# Patient Record
Sex: Female | Born: 1945
Health system: Southern US, Community
[De-identification: ages and names within clinical notes are randomized; demographics above are authoritative.]

## PROBLEM LIST (undated history)

## (undated) DIAGNOSIS — H269 Unspecified cataract: Secondary | ICD-10-CM

## (undated) DIAGNOSIS — F329 Major depressive disorder, single episode, unspecified: Secondary | ICD-10-CM

## (undated) DIAGNOSIS — F419 Anxiety disorder, unspecified: Secondary | ICD-10-CM

## (undated) DIAGNOSIS — I4891 Unspecified atrial fibrillation: Secondary | ICD-10-CM

## (undated) DIAGNOSIS — N186 End stage renal disease: Secondary | ICD-10-CM

## (undated) DIAGNOSIS — R42 Dizziness and giddiness: Secondary | ICD-10-CM

## (undated) DIAGNOSIS — N189 Chronic kidney disease, unspecified: Secondary | ICD-10-CM

## (undated) DIAGNOSIS — E1122 Type 2 diabetes mellitus with diabetic chronic kidney disease: Secondary | ICD-10-CM

## (undated) DIAGNOSIS — N1330 Unspecified hydronephrosis: Secondary | ICD-10-CM

## (undated) DIAGNOSIS — E785 Hyperlipidemia, unspecified: Secondary | ICD-10-CM

## (undated) DIAGNOSIS — Z87442 Personal history of urinary calculi: Secondary | ICD-10-CM

## (undated) DIAGNOSIS — I700 Atherosclerosis of aorta: Secondary | ICD-10-CM

## (undated) DIAGNOSIS — K219 Gastro-esophageal reflux disease without esophagitis: Secondary | ICD-10-CM

## (undated) DIAGNOSIS — M109 Gout, unspecified: Secondary | ICD-10-CM

## (undated) DIAGNOSIS — E669 Obesity, unspecified: Secondary | ICD-10-CM

## (undated) DIAGNOSIS — E119 Type 2 diabetes mellitus without complications: Secondary | ICD-10-CM

## (undated) DIAGNOSIS — I10 Essential (primary) hypertension: Secondary | ICD-10-CM

## (undated) DIAGNOSIS — D631 Anemia in chronic kidney disease: Secondary | ICD-10-CM

## (undated) DIAGNOSIS — K652 Spontaneous bacterial peritonitis: Secondary | ICD-10-CM

## (undated) DIAGNOSIS — N185 Chronic kidney disease, stage 5: Secondary | ICD-10-CM

## (undated) DIAGNOSIS — F32A Depression, unspecified: Secondary | ICD-10-CM

## (undated) DIAGNOSIS — I499 Cardiac arrhythmia, unspecified: Secondary | ICD-10-CM

## (undated) DIAGNOSIS — M1990 Unspecified osteoarthritis, unspecified site: Secondary | ICD-10-CM

## (undated) HISTORY — PX: EYE SURGERY: SHX253

## (undated) HISTORY — DX: Hyperlipidemia, unspecified: E78.5

## (undated) HISTORY — DX: Type 2 diabetes mellitus without complications: E11.9

## (undated) HISTORY — DX: Dizziness and giddiness: R42

## (undated) HISTORY — DX: Essential (primary) hypertension: I10

---
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
## 1990-11-26 HISTORY — PX: TUBAL LIGATION: SHX77

## 2004-07-19 ENCOUNTER — Other Ambulatory Visit: Payer: Self-pay

## 2004-08-29 ENCOUNTER — Inpatient Hospital Stay: Payer: Self-pay | Admitting: Cardiovascular Disease

## 2004-08-29 ENCOUNTER — Other Ambulatory Visit: Payer: Self-pay

## 2004-08-30 ENCOUNTER — Other Ambulatory Visit: Payer: Self-pay

## 2005-01-15 ENCOUNTER — Ambulatory Visit: Payer: Self-pay | Admitting: Specialist

## 2005-02-08 ENCOUNTER — Ambulatory Visit: Payer: Self-pay | Admitting: Specialist

## 2005-09-11 ENCOUNTER — Other Ambulatory Visit: Payer: Self-pay

## 2005-09-20 ENCOUNTER — Ambulatory Visit: Payer: Self-pay | Admitting: Specialist

## 2005-10-07 ENCOUNTER — Emergency Department: Payer: Self-pay | Admitting: Internal Medicine

## 2014-01-27 ENCOUNTER — Inpatient Hospital Stay: Payer: Self-pay | Admitting: Family Medicine

## 2014-01-27 LAB — COMPREHENSIVE METABOLIC PANEL
ALK PHOS: 110 U/L
AST: 36 U/L (ref 15–37)
Albumin: 4.1 g/dL (ref 3.4–5.0)
Anion Gap: 8 (ref 7–16)
BILIRUBIN TOTAL: 0.5 mg/dL (ref 0.2–1.0)
BUN: 21 mg/dL — ABNORMAL HIGH (ref 7–18)
CALCIUM: 9.5 mg/dL (ref 8.5–10.1)
CREATININE: 1.27 mg/dL (ref 0.60–1.30)
Chloride: 99 mmol/L (ref 98–107)
Co2: 24 mmol/L (ref 21–32)
GFR CALC AF AMER: 51 — AB
GFR CALC NON AF AMER: 44 — AB
GLUCOSE: 328 mg/dL — AB (ref 65–99)
OSMOLALITY: 278 (ref 275–301)
Potassium: 4.6 mmol/L (ref 3.5–5.1)
SGPT (ALT): 30 U/L (ref 12–78)
SODIUM: 131 mmol/L — AB (ref 136–145)
TOTAL PROTEIN: 8.1 g/dL (ref 6.4–8.2)

## 2014-01-27 LAB — CK TOTAL AND CKMB (NOT AT ARMC)
CK, TOTAL: 113 U/L
CK, TOTAL: 123 U/L
CK-MB: 2.7 ng/mL (ref 0.5–3.6)
CK-MB: 2.7 ng/mL (ref 0.5–3.6)

## 2014-01-27 LAB — TROPONIN I
TROPONIN-I: 0.13 ng/mL — AB
TROPONIN-I: 0.14 ng/mL — AB
Troponin-I: 0.14 ng/mL — ABNORMAL HIGH

## 2014-01-27 LAB — URINALYSIS, COMPLETE
BILIRUBIN, UR: NEGATIVE
Bacteria: NONE SEEN
Ketone: NEGATIVE
Nitrite: NEGATIVE
Ph: 5 (ref 4.5–8.0)
Protein: >=500
RBC,UR: 3 /HPF (ref 0–5)
SPECIFIC GRAVITY: 1.024 (ref 1.003–1.030)
WBC UR: 30 /HPF (ref 0–5)

## 2014-01-27 LAB — APTT
Activated PTT: 26.8 secs (ref 23.6–35.9)
Activated PTT: 70.6 secs — ABNORMAL HIGH (ref 23.6–35.9)

## 2014-01-27 LAB — CBC
HCT: 41.6 % (ref 35.0–47.0)
HGB: 14.1 g/dL (ref 12.0–16.0)
MCH: 30 pg (ref 26.0–34.0)
MCHC: 33.9 g/dL (ref 32.0–36.0)
MCV: 88 fL (ref 80–100)
Platelet: 183 10*3/uL (ref 150–440)
RBC: 4.7 10*6/uL (ref 3.80–5.20)
RDW: 13.8 % (ref 11.5–14.5)
WBC: 7.7 10*3/uL (ref 3.6–11.0)

## 2014-01-28 LAB — CBC WITH DIFFERENTIAL/PLATELET
BASOS ABS: 0.1 10*3/uL (ref 0.0–0.1)
Basophil %: 0.5 %
EOS PCT: 0.1 %
Eosinophil #: 0 10*3/uL (ref 0.0–0.7)
HCT: 42.4 % (ref 35.0–47.0)
HGB: 13.9 g/dL (ref 12.0–16.0)
LYMPHS ABS: 1.3 10*3/uL (ref 1.0–3.6)
LYMPHS PCT: 10.1 %
MCH: 29.1 pg (ref 26.0–34.0)
MCHC: 32.9 g/dL (ref 32.0–36.0)
MCV: 89 fL (ref 80–100)
MONO ABS: 0.4 x10 3/mm (ref 0.2–0.9)
Monocyte %: 2.7 %
Neutrophil #: 11.5 10*3/uL — ABNORMAL HIGH (ref 1.4–6.5)
Neutrophil %: 86.6 %
Platelet: 185 10*3/uL (ref 150–440)
RBC: 4.79 10*6/uL (ref 3.80–5.20)
RDW: 14.2 % (ref 11.5–14.5)
WBC: 13.3 10*3/uL — AB (ref 3.6–11.0)

## 2014-01-28 LAB — BASIC METABOLIC PANEL
Anion Gap: 8 (ref 7–16)
BUN: 24 mg/dL — AB (ref 7–18)
CALCIUM: 9.8 mg/dL (ref 8.5–10.1)
Chloride: 100 mmol/L (ref 98–107)
Co2: 26 mmol/L (ref 21–32)
Creatinine: 1.24 mg/dL (ref 0.60–1.30)
GFR CALC AF AMER: 52 — AB
GFR CALC NON AF AMER: 45 — AB
GLUCOSE: 356 mg/dL — AB (ref 65–99)
OSMOLALITY: 287 (ref 275–301)
Potassium: 4.7 mmol/L (ref 3.5–5.1)
Sodium: 134 mmol/L — ABNORMAL LOW (ref 136–145)

## 2014-01-28 LAB — APTT
Activated PTT: 63.5 secs — ABNORMAL HIGH (ref 23.6–35.9)
Activated PTT: 25.2 secs (ref 23.6–35.9)

## 2014-01-28 LAB — LIPID PANEL
Cholesterol: 154 mg/dL (ref 0–200)
HDL Cholesterol: 53 mg/dL (ref 40–60)
LDL CHOLESTEROL, CALC: 47 mg/dL (ref 0–100)
Triglycerides: 269 mg/dL — ABNORMAL HIGH (ref 0–200)
VLDL Cholesterol, Calc: 54 mg/dL — ABNORMAL HIGH (ref 5–40)

## 2014-01-28 LAB — HEMOGLOBIN A1C: Hemoglobin A1C: 12.3 % — ABNORMAL HIGH (ref 4.2–6.3)

## 2014-01-28 LAB — MAGNESIUM: Magnesium: 1.1 mg/dL — ABNORMAL LOW

## 2014-01-28 LAB — TSH: Thyroid Stimulating Horm: 1.97 u[IU]/mL

## 2014-01-29 LAB — BASIC METABOLIC PANEL
Anion Gap: 8 (ref 7–16)
BUN: 31 mg/dL — ABNORMAL HIGH (ref 7–18)
CHLORIDE: 96 mmol/L — AB (ref 98–107)
Calcium, Total: 9.7 mg/dL (ref 8.5–10.1)
Co2: 29 mmol/L (ref 21–32)
Creatinine: 1.47 mg/dL — ABNORMAL HIGH (ref 0.60–1.30)
EGFR (Non-African Amer.): 37 — ABNORMAL LOW
GFR CALC AF AMER: 43 — AB
GLUCOSE: 377 mg/dL — AB (ref 65–99)
Osmolality: 288 (ref 275–301)
POTASSIUM: 4 mmol/L (ref 3.5–5.1)
Sodium: 133 mmol/L — ABNORMAL LOW (ref 136–145)

## 2014-01-29 LAB — HEMOGLOBIN: HGB: 13.9 g/dL (ref 12.0–16.0)

## 2014-01-29 LAB — PLATELET COUNT: PLATELETS: 217 10*3/uL (ref 150–440)

## 2014-01-29 LAB — MAGNESIUM: MAGNESIUM: 1.8 mg/dL

## 2014-02-16 ENCOUNTER — Ambulatory Visit: Payer: Self-pay | Admitting: Family Medicine

## 2014-02-24 ENCOUNTER — Ambulatory Visit: Payer: Self-pay | Admitting: Family Medicine

## 2014-03-26 ENCOUNTER — Ambulatory Visit: Payer: Self-pay | Admitting: Family Medicine

## 2014-04-26 ENCOUNTER — Ambulatory Visit: Payer: Self-pay | Admitting: Family Medicine

## 2015-03-19 NOTE — Consult Note (Signed)
PATIENT NAME:  Heather Lopez, Heather Lopez MR#:  [id number] DATE OF BIRTH:  05-03-1946  DATE OF CONSULTATION:  01/28/2014  REFERRING PHYSICIAN:   CONSULTING PHYSICIAN:  Dionisio David, MD  INDICATION FOR CONSULTATION: Elevated troponin, non- STEMI, presyncope.   HISTORY OF PRESENT ILLNESS: This is a 69 year old white female with a past medical history of hypertension, hyperlipidemia, and diabetes, usually uncontrolled, who came into the hospital because all of a sudden felt like she is going to pass out. She felt dizzy with her head spinning. Her troponin was 0.14. I was asked to evaluate the patient. The patient denied any chest pain, but has a lot of risk factors for coronary artery disease including hypertension, diabetes, hyperlipidemia, and family history of coronary artery disease.   ALLERGIES: DYE. SHE GETS SHORTNESS OF BREATH.  MEDICATIONS: Insulin, metformin, Lipitor, amlodipine, spironolactone, Wellbutrin.   PHYSICAL EXAMINATION: GENERAL: She is alert and oriented x3, in no acute distress.  VITAL SIGNS: Stable.  NECK: No JVD.  LUNGS: Clear.  HEART: Regular rate and rhythm. Normal S1, S2. No audible murmur.  ABDOMEN: Soft, nontender. Positive bowel sounds.  EXTREMITIES: No pedal edema.  NEUROLOGIC: The patient appears to be intact.   DIAGNOSTIC DATA: Her EKG shows normal sinus rhythm, poor R wave progression suggestive of anteroseptal wall MI.   Her echo had normal EF, mitral regurgitation.  Her BUN was 24 and creatinine 1.24. Her troponins, initial one, was 0.14; the repeat one was 0.14.   ASSESSMENT AND PLAN: Non-ST-segment elevation myocardial infarction. Multiple risk factors coronary artery disease including diabetes, hypertension, hyperlipidemia, family history of coronary artery disease, and ex-smoker. Advise cardiac catheterization.  ____________________________ Dionisio David, MD sak:sb D: 01/28/2014 13:51:35 ET T: 01/28/2014 14:08:05 ET JOB#: [id number]  cc: Dionisio David,  MD, <Dictator> Dionisio David MD ELECTRONICALLY SIGNED 02/08/2014 12:13

## 2015-03-19 NOTE — Discharge Summary (Signed)
PATIENT NAME:  Heather Lopez, SERFASS MR#:  [id number] DATE OF BIRTH:  09-16-1946  DATE OF ADMISSION:  01/27/2014 DATE OF DISCHARGE:  01/29/2014  REASON FOR ADMISSION: Dizziness.   DISCHARGE DIAGNOSES:  1. Vertigo and dizziness which is secondary to benign positional vertigo, possible Meniere's disease.  2. Elevated troponin but rule out non-ST elevation myocardial infarction.  3. Hypertension.  4. Uncontrolled diabetes.  5. Hyperlipidemia.  6. Hypomagnesemia.   DISPOSITION: Home.   FOLLOWUP:  1. Referral to ENT outpatient.  2. Follow up with Dr. Jeananne Rama in the next 1 to 2 weeks.   DIET: Carbohydrate consistent. Referral to outpatient diabetes education.   MEDICATIONS ON DISCHARGE:  1. Glimepiride 4 mg once a day.  2. Wellbutrin 150 mg once a day.  3. Atorvastatin 1 tablet once a day.  4. Metformin 1000 mg twice daily to be started on 01/30/2014  5. Levemir 55 units once a day in the morning, 45 units in the evening. Increase a unit in a.m. and 2 units in p.m. every day until blood glucose is below 200 consistently. Referral to new PCP has been given to the patient to follow up on this.  6. Amlodipine 5 mg take 2 tablets once a day.  7. Aspirin 81 mg once daily.  8. Metoprolol 25 mg twice daily.  9. Meclizine 1 tablet every 6 hours.  10. Prescription for blood glucose strips.   PROCEDURES: Cardiac catheterization done by Dr. Humphrey Rolls showing [place] left ventricular function normal with normal coronaries.   OTHER PROCEDURES DONE: CT of the head without contrast due to the patient being off balance. No intracranial abnormality. No significant CVAs.   Chest x-ray: No active cardiopulmonary disease.   MRI of the brain without contrast shows no evidence of acute intracranial abnormality. Mild chronic small vessel ischemia. Mild prominent pituitary gland. If there is any clinical concern, consider dedicated MRI of pituitary gland.   Glucose on admission was around 356. Sodium was 134. Potassium  4.7. Her creatinine was 1.24. She had a magnesium of 1.1, which was replaced, and a hemoglobin A1c of 12.3. Her first troponin was 0.14, the second one 0.13 and the third one 0.14. Her white count was 7.7, elevated at 30.3 with steroids.   Echocardiogram: Normal ventricular ejection fraction of 50% to 60%.   HOSPITAL COURSE: This is a very nice 69 year old female with history of dizziness. The patient has hypertension, hyperlipidemia and diabetes. Started having some mild dizziness the day before admission and started to get worse. She woke up at 2 a.m. in the morning. Went to the bathroom and felt like everything was spinning. Did not have any chest pain or shortness of breath. Continued to feel dizzy, for what she went to the urgent care. She was noted to have blood sugars in the 400s and a slight elevation of the blood pressure. She had positive finger-to-nose and Romberg test. For what the patient was admitted to evaluate posterior circulation CVA/cerebellar CVA. The patient had an MRI, and the MRI ruled out the possibility of CVA. mostly seems to be vertigo/Meniere's disease. We recommended the patient to follow up with ENT as an outpatient and if her symptoms persist, to come back. The patient understands that she needs to follow up. We also commented that there was a small abnormality on her pituitary gland and if there are any continuation of problems, especially visual abnormalities or metabolic abnormalities, she will need to have dedicated MRI of the pituitary gland. Since the patient had  slight elevation of troponin, Dr. Humphrey Rolls was consulted, and he decided to take the patient for a cardiac catheterization. The cardiac catheterization was normal. As far as her glucose elevation, the patient actually recognized that she has not been eating very well. Her husband has been recently diagnosed with diabetes, and they are not compliant with diet. Education was given for diabetes. I recommended to go with her  husband to sit in on a class so they both can relearn how to manage diabetes, especially diet-wise, and she agrees to follow up as outpatient. Her insulin has been modified as mentioned above in the discharge medications.   As far as her blood pressure, it seems to be stable. Her hypercholesteremia is actually well controlled. Her LDL is below 70. Her hypomagnesemia was corrected, and the patient started feeling better. Her TSH was 1.97, for what we were not too concerned about the pituitary gland to be overfunctioning.   Again, consider followup with another MRI dedicated for the pituitary gland as an outpatient.   I spent about 45 minutes discharging this patient.   ____________________________ Northampton Sink, MD rsg:gb D: 02/04/2014 16:06:13 ET T: 02/05/2014 04:38:54 ET JOB#: [id number]  cc: Barnett Sink, MD, <Dictator> Guadalupe Maple, MD Cristi Loron MD ELECTRONICALLY SIGNED 02/16/2014 14:06

## 2015-03-19 NOTE — H&P (Signed)
PATIENT NAME:  Heather Lopez, Heather Lopez MR#:  [id number] DATE OF BIRTH:  05/23/1946  DATE OF ADMISSION:  01/27/2014  REFERRING PHYSICIAN: Dr. Corky Downs.  PRIMARY CARE PHYSICIAN: Does not have one. She used to see Dr. Dear, her OB/GYN.   CHIEF COMPLAINT: Dizziness.  HISTORY OF PRESENT ILLNESS: The patient is a morbidly obese 69 year old female with hypertension, hyperlipidemia, diabetes. She started to have some mild dizziness yesterday, did not think much about it and went to bed. She woke up at 2:00 a.m. to go to the bathroom and felt like she was very vertiginous. She had to hold onto walls and furniture in order not to fall. She felt dizzy and felt like the room was spinning. She went to the bathroom with the help of her husband, then came back to bed. The dizziness is with ambulation and movement. She has no chest pains or shortness of breath. She went to sleep, woke up, and again was very dizzy. She went to [facility] for these symptoms, was noted to have sugars in the 400 range, and blood pressure was slightly elevated at 169/84. She was referred here, as she had some mention for positive finger-to-nose and Romberg test, for further work-up for cerebellar stroke. Here, she was noted to have a positive troponin of 0.14 on work-up and was also noted to have an abnormal EKG. The case was discussed with a cardiologist, and the recommendation for cardiac cath and heparin drip was made. At this point, the heparin drip has not been started. When I went into the room, the patient described no chest pains, but is dizzy with movement and moving the head and even sitting in her bed, without any shortness of breath, chest pains, or blurry vision.   PAST MEDICAL HISTORY: Hypertension, diabetes, hyperlipidemia, history of nephrolithiasis.   PAST SURGICAL HISTORY: History of lithotripsy for a staghorn calculus and tubal ligation.   OUTPATIENT MEDICATIONS: Levemir 50 units in the morning and 40 units in p.m., metformin  1000 mg 2 times a day, Lipitor 20 mg daily, amlodipine 5 mg daily, spironolactone 25 mg daily, Wellbutrin 150 mg extended-release daily.   ALLERGIES: IODINE, WHICH IS A STRONG ALLERGY WITH A FEELING OF THROAT TIGHTENING. SUBSEQUENT IMAGING REQUIRING IODINE, SHE WAS GIVEN A DAY OR 2 OF DEPO-MEDROL, AND SHE TOLERATED THE CONTRAST WITHOUT ANY ISSUES PER HER.  FAMILY HISTORY: Brother, dad with diabetes. Mom with a stroke.   SOCIAL HISTORY: Lives with her husband. No tobacco, alcohol, or drug use.  REVIEW OF SYSTEMS:    CONSTITUTIONAL: No fever, fatigue or weakness. No weight changes.  EYES: No blurry vision or double vision.  EARS, NOSE, THROAT: No tinnitus or hearing loss. No snoring. No postnasal drip.  RESPIRATORY: No cough, wheezing, shortness of breath or dyspnea on exertion. No painful respirations.  CARDIOVASCULAR: No chest pain, swelling in the legs. No dyspnea on exertion. Has hypertension.  GASTROINTESTINAL: No nausea, vomiting, diarrhea. No abdominal pain, bloody stools or dark stools.  GENITOURINARY: Denies dysuria, hematuria. HEMATOLOGIC AND LYMPHATIC: Denies anemia or easy bruising.  SKIN: No rashes.  MUSCULOSKELETAL: Denies arthritis, gout. NEUROLOGIC: Dizziness and vertigo as above. No history of stroke. No limb weakness or numbness.  PSYCHIATRIC: Has depression.   PHYSICAL EXAMINATION: VITAL SIGNS: Temperature on arrival 98.7, pulse rate 88, respiratory rate 18. Initial blood pressure was 203/93 per chart. O2 sat 96% on room air.  GENERAL: The patient is an obese Caucasian female lying in bed, no obvious distress, talking in full sentences.  HEENT: Normocephalic, atraumatic. Pupils are equal and reactive. Anicteric sclerae. Moist mucous membranes. Extraocular muscles intact. No nystagmus.  NECK: Supple. No thyroid tenderness. No cervical lymphadenopathy.  CARDIOVASCULAR: S1, S2 regular. No significant murmurs, rubs or gallops.  LUNGS: Clear to auscultation. No wheezing,  rhonchi or rales.  ABDOMEN: Soft, nontender, nondistended. Positive bowel sounds in all quadrants.  EXTREMITIES: No pitting edema.  NEUROLOGIC: Cranial nerves II through XII grossly intact. Strength is 5 out of 5 in all extremities. Sensation is intact to light touch. I did not ambulate to test the gait. However, even on sitting on the bed with head movement, she gets dizzy. Negative finger-to-nose test here, although it seemed it was possibly positive with Romberg per urgent care. No nystagmus. Heel-to-shin negative.  PSYCHIATRIC: Awake, alert, oriented x 3.  SKIN: No obvious rashes or lesions.   LABORATORY, DIAGNOSTIC, AND RADIOLOGICAL DATA: Glucose 328, BUN 21, creatinine 1.27, sodium 131, potassium 4.6. LFTs within normal limits. Troponin 0.14. CBC within normal limits. Urinalysis: No nitrites, 2+ leukocyte esterase, 30 WBC, no bacteria.   EKG: Normal sinus rhythm. Rate is 84. There are what appear to be Q waves in V2, V3. No acute ST elevations or depressions. Some T wave inversions in I and aVL. No acute ST elevations.   CAT scan of the head without contrast showing no acute intracranial abnormality, no evidence of significant chronic changes. Chest, PA and lateral, showing no active cardiopulmonary disease.   ASSESSMENT AND PLAN: We have a 69 year old pleasant female with hypertension, diabetes, hyperlipidemia, who has been having some dizziness for about a day, worse about 2:00 a.m. with vertiginous signs, who was referred from urgent care center for further evaluation for stroke with a positive troponin and some EKG changes.  1.  Dizziness: The cause of dizziness at this point is not absolutely clear; however, there are some hints that the patient does have some symptoms of vertigo with the possible positive abnormal finger-to-nose and Romberg earlier, elevated blood pressure in the 200 range on arrival. With multiple risk factors, this could certainly be a posterior fossa stroke. Furthermore,  this could be a benign positional vertigo. It is also possible that this is more of an non-ST segment elevation myocardial infarction, as it could be an atypical presentation with the positive troponin and some EKG changes. Heparin drip has been recommended, but it has not been started yet, and I would at this point prefer to get an MRI of the brain to evaluate the posterior fossa to make sure there is no stroke before starting the heparin, as it is a closed space and would like to prevent a bleed if there is a stroke there. We would rule out a stroke with MRI, and then start a heparin drip for further treatment if a stroke is ruled out , resume the statin, start a low-dose beta blocker. Would cycle the troponins and CK-MB, obtain an echocardiogram, and cardiology, Dr. Humphrey Rolls, has been already briefed. Her symptoms could also be from non-ST elevation, as discussed above. The patient would likely be taken to the cath lab; however, SHE DOES HAVE AN IODINE ALLERGY, AND IT IS SEVERE WITH POSSIBLE ANAPHYLAXIS. She did state that with Benadryl pretreatment, she could tolerate iodine; therefore, I would go ahead and give her standing Benadryl and 4 doses of IV Solu-Medrol in case she is going for a cardiac cath tomorrow. I did discuss that even with this, it is not a fail-proof way to stop anaphylaxis to her. In regards  to the accelerated hypertension, I would continue with outpatient medications if stroke is ruled out, in addition to a beta blocker. In regards to her diabetes, would start lower dose insulin at this point, in addition to sliding scale insulin. Check a hemoglobin A1c.   CODE STATUS: The patient is full code.   TOTAL TIME SPENT: 60 minutes.   ____________________________ Vivien Presto, MD sa:jcm D: 01/27/2014 15:39:05 ET T: 01/27/2014 17:35:02 ET JOB#: [id number]  cc: Vivien Presto, MD, <Dictator> Vivien Presto MD ELECTRONICALLY SIGNED 02/25/2014 15:36

## 2015-07-12 ENCOUNTER — Encounter: Payer: Self-pay | Admitting: Family Medicine

## 2015-07-12 ENCOUNTER — Ambulatory Visit (INDEPENDENT_AMBULATORY_CARE_PROVIDER_SITE_OTHER): Payer: Medicare Other | Admitting: Family Medicine

## 2015-07-12 VITALS — BP 140/78 | HR 69 | Temp 98.2°F | Resp 16 | Ht 64.0 in | Wt 239.0 lb

## 2015-07-12 DIAGNOSIS — Z794 Long term (current) use of insulin: Principal | ICD-10-CM

## 2015-07-12 DIAGNOSIS — IMO0002 Reserved for concepts with insufficient information to code with codable children: Secondary | ICD-10-CM

## 2015-07-12 DIAGNOSIS — E1165 Type 2 diabetes mellitus with hyperglycemia: Secondary | ICD-10-CM

## 2015-07-12 DIAGNOSIS — E785 Hyperlipidemia, unspecified: Secondary | ICD-10-CM

## 2015-07-12 DIAGNOSIS — E1169 Type 2 diabetes mellitus with other specified complication: Secondary | ICD-10-CM | POA: Diagnosis not present

## 2015-07-12 DIAGNOSIS — F329 Major depressive disorder, single episode, unspecified: Secondary | ICD-10-CM | POA: Diagnosis not present

## 2015-07-12 DIAGNOSIS — Z23 Encounter for immunization: Secondary | ICD-10-CM

## 2015-07-12 DIAGNOSIS — Z6841 Body Mass Index (BMI) 40.0 and over, adult: Secondary | ICD-10-CM

## 2015-07-12 DIAGNOSIS — N183 Chronic kidney disease, stage 3 unspecified: Secondary | ICD-10-CM | POA: Insufficient documentation

## 2015-07-12 DIAGNOSIS — Z1211 Encounter for screening for malignant neoplasm of colon: Secondary | ICD-10-CM | POA: Diagnosis not present

## 2015-07-12 DIAGNOSIS — I129 Hypertensive chronic kidney disease with stage 1 through stage 4 chronic kidney disease, or unspecified chronic kidney disease: Secondary | ICD-10-CM | POA: Diagnosis not present

## 2015-07-12 DIAGNOSIS — E119 Type 2 diabetes mellitus without complications: Secondary | ICD-10-CM | POA: Insufficient documentation

## 2015-07-12 DIAGNOSIS — F32A Depression, unspecified: Secondary | ICD-10-CM

## 2015-07-12 LAB — POCT GLYCOSYLATED HEMOGLOBIN (HGB A1C): HEMOGLOBIN A1C: 11.3

## 2015-07-12 MED ORDER — INSULIN ASPART 100 UNIT/ML FLEXPEN: 6.0000 [IU] | PEN_INJECTOR | Freq: Three times a day (TID) | SUBCUTANEOUS | Status: DC

## 2015-07-12 MED ORDER — INSULIN PEN NEEDLE 32G X 4 MM MISC: 1.0000 | Freq: Two times a day (BID) | Status: DC

## 2015-07-12 MED ORDER — INSULIN DETEMIR 100 UNIT/ML FLEXPEN: PEN_INJECTOR | SUBCUTANEOUS | Status: DC

## 2015-07-12 MED ORDER — ATORVASTATIN CALCIUM 20 MG PO TABS: 20.0000 mg | ORAL_TABLET | Freq: Every day | ORAL | Status: DC

## 2015-07-12 MED ORDER — LOSARTAN POTASSIUM 50 MG PO TABS: 50.0000 mg | ORAL_TABLET | Freq: Every day | ORAL | Status: DC

## 2015-07-12 MED ORDER — METOPROLOL TARTRATE 25 MG PO TABS: 25.0000 mg | ORAL_TABLET | Freq: Two times a day (BID) | ORAL | Status: DC

## 2015-07-12 MED ORDER — METFORMIN HCL 1000 MG PO TABS: 1000.0000 mg | ORAL_TABLET | Freq: Two times a day (BID) | ORAL | Status: DC

## 2015-07-12 MED ORDER — BUPROPION HCL ER (XL) 150 MG PO TB24: 150.0000 mg | ORAL_TABLET | Freq: Every day | ORAL | Status: DC

## 2015-07-12 NOTE — Patient Instructions (Signed)
Please check your blood glucose 3 times daily. If your glucose is < 70 mg/dl or you have symptoms of hypoglycemia confusion, dizziness, headache, hunger, jitteriness and sweating please drink 4 oz of juice or soda.  Check blood glucose 15 minutes later. If it has not risen to >100, please seek medical attention. If > 100 please eat a snack containing protein such as peanut butter and crackers.  Mealtime insulin: Check blood sugar prior to eating, hold if < 130 or symptoms of hypoglycemia. Injection insulin with food in front of you.

## 2015-07-12 NOTE — Assessment & Plan Note (Signed)
Pt has joined weight watchers to help reduce BMI.  Encouraged [email or web address] to help with food choices. Goal exercise 150 minutes (30-31minutes, 3-4x per week).

## 2015-07-12 NOTE — Assessment & Plan Note (Addendum)
A1c increased to 11.3%- on jaridance for 1 year with no significant improvement. STOP and start mealtime insulin- 6 units with meals. Holding and hypoglycemia precautions reviewed. Follow-up 3 weeks Dr. Luan Pulling to check blood sugars and titrate insulin.  Pt is starting weight watchers to help with diet and exercise to help improve diabetes. Referred to [facility] for diabetic Eye exam Foot exam done. CMP, microalbumin ordered. Pt is on an ARB.

## 2015-07-12 NOTE — Progress Notes (Signed)
Subjective:    Patient ID: Heather Lopez, female    DOB: Oct 12, 1946, 69 y.o.   MRN: [id number]  HPI: Heather Lopez is a 68 y.o. female presenting on 07/12/2015 for Establish Care and Diabetes   Diabetes She presents for her follow-up diabetic visit. She has type 2 diabetes mellitus. Her disease course has been worsening. Hypoglycemia symptoms include dizziness (Pt has occasional bouts of vertigo- treated with meclinzine). Pertinent negatives for hypoglycemia include no headaches. Associated symptoms include polydipsia (attributes to dry mouth). Pertinent negatives for diabetes include no blurred vision, no chest pain, no foot paresthesias, no foot ulcerations, no polyphagia, no polyuria, no visual change and no weight loss. Symptoms are worsening. There are no diabetic complications. Pertinent negatives for diabetic complications include no retinopathy. Her weight is stable. She is following a generally healthy diet. Her home blood glucose trend is decreasing steadily. Her breakfast blood glucose range is generally 180-200 mg/dl. Her overall blood glucose range is 180-200 mg/dl. An ACE inhibitor/angiotensin II receptor blocker is being taken. She does not see a podiatrist.Eye exam is not current.  Hypertension This is a chronic problem. The current episode started more than 1 year ago. Pertinent negatives include no blurred vision, chest pain, headaches, palpitations, peripheral edema or shortness of breath. Risk factors for coronary artery disease include sedentary lifestyle, obesity and diabetes mellitus. Past treatments include angiotensin blockers and calcium channel blockers. There is no history of retinopathy.  Hyperlipidemia This is a chronic problem. Exacerbating diseases include diabetes. Pertinent negatives include no chest pain, leg pain, myalgias or shortness of breath.   Depression: has been on Wellbutrin for several years. Overall doing well. Denies SI/HI.   Pt presents for follow-up on  diabetes, HTN, and HLD. She established care 1 year ago with Dr. Luan Pulling and has not been back.  Diabetes: She checks sugars- in AM  Not less then 200 in the AM. Decreased soda intake, diet colas, stopped sweet tea. Has recently started weight watchers.  Pt reports she has not taken medications this AM.   Past Medical History  Diagnosis Date  . Diabetes mellitus without complication   . Hypertension   . Hyperlipidemia   . Vertigo     No current outpatient prescriptions on file prior to visit.   No current facility-administered medications on file prior to visit.    Review of Systems  Constitutional: Negative for fever, chills and weight loss.  HENT: Negative.   Eyes: Negative for blurred vision.  Respiratory: Negative for shortness of breath.   Cardiovascular: Negative for chest pain, palpitations and leg swelling.  Gastrointestinal: Negative for abdominal pain and abdominal distention.  Endocrine: Positive for polydipsia (attributes to dry mouth). Negative for cold intolerance, heat intolerance, polyphagia and polyuria.  Genitourinary: Negative.   Musculoskeletal: Negative.  Negative for myalgias.  Neurological: Positive for dizziness (Pt has occasional bouts of vertigo- treated with meclinzine). Negative for numbness and headaches.  Psychiatric/Behavioral: Negative.    Per HPI unless specifically indicated above Depression screen [facility] 2/9 07/12/2015  Decreased Interest 1  Down, Depressed, Hopeless 0  PHQ - 2 Score 1  Altered sleeping 1  Tired, decreased energy 2  Change in appetite 1  Feeling bad or failure about yourself  0  Trouble concentrating 0  Moving slowly or fidgety/restless 0  Suicidal thoughts 0  PHQ-9 Score 5       Objective:    BP 140/78 mmHg  Pulse 69  Temp(Src) 98.2 F (36.8 C) (Oral)  Resp  16  Ht 5\' 4"  (1.626 m)  Wt 239 lb (108.41 kg)  BMI 41.00 kg/m2  Wt Readings from Last 3 Encounters:  07/12/15 239 lb (108.41 kg)    Physical Exam   Constitutional: She is oriented to person, place, and time. She appears well-developed and well-nourished. No distress.  Neck: Normal range of motion. Neck supple. No thyromegaly present.  Cardiovascular: Normal rate and regular rhythm.  Exam reveals no gallop and no friction rub.   No murmur heard. Pulmonary/Chest: Effort normal and breath sounds normal.  Abdominal: Soft. Bowel sounds are normal. There is no tenderness. There is no rebound.  Musculoskeletal: Normal range of motion. She exhibits no edema or tenderness.  Lymphadenopathy:    She has no cervical adenopathy.  Neurological: She is alert and oriented to person, place, and time.  Skin: Skin is warm and dry. She is not diaphoretic.    Results for orders placed or performed in visit on 07/12/15  POCT HgB A1C  Result Value Ref Range   Hemoglobin A1C 11.3       Diabetic Foot Exam - Simple   Simple Foot Form  Diabetic Foot exam was performed with the following findings:  Yes 07/12/2015 11:49 AM  Visual Inspection  No deformities, no ulcerations, no other skin breakdown bilaterally:  Yes  Sensation Testing  Intact to touch and monofilament testing bilaterally:  Yes  Pulse Check  Posterior Tibialis and Dorsalis pulse intact bilaterally:  Yes  Comments      Assessment & Plan:   Problem List Items Addressed This Visit      Cardiovascular and Mediastinum   Benign hypertension with CKD (chronic kidney disease) stage III    Controlled in office today, considering pt has not taken medications. Discussed DASH diet. BP goal < 140/90. Alarm precautions reviewed.  Last GFR 53. CMP done today. ARB for renal protection.      Relevant Medications   aspirin EC 81 MG tablet   amLODipine (NORVASC) 5 MG tablet   metoprolol tartrate (LOPRESSOR) 25 MG tablet   losartan (COZAAR) 50 MG tablet   atorvastatin (LIPITOR) 20 MG tablet     Other   Uncontrolled type 2 diabetes mellitus with insulin therapy - Primary    A1c increased to  11.3%- on jaridance for 1 year with no significant improvement. STOP and start mealtime insulin- 6 units with meals. Holding and hypoglycemia precautions reviewed.  Pt is starting weight watchers to help with diet and exercise to help improve diabetes. Referred to [facility] for diabetic Eye exam Foot exam done. CMP, microalbumin ordered. Pt is on an ARB.       Relevant Medications   aspirin EC 81 MG tablet   Insulin Pen Needle 32G X 4 MM MISC   metFORMIN (GLUCOPHAGE) 1000 MG tablet   losartan (COZAAR) 50 MG tablet   Insulin Detemir (LEVEMIR) 100 UNIT/ML Pen   atorvastatin (LIPITOR) 20 MG tablet   insulin aspart (NOVOLOG FLEXPEN) 100 UNIT/ML FlexPen   Other Relevant Orders   POCT HgB A1C (Completed)   Comprehensive Metabolic Panel (CMET)   Lipid Profile   Urine Microalbumin w/creat. ratio   Ambulatory referral to Ophthalmology   Hyperlipidemia associated with type 2 diabetes mellitus    Renewed atorvastatin. Check lipid panel today. Heart Healthy diet reviewed.       Relevant Medications   aspirin EC 81 MG tablet   amLODipine (NORVASC) 5 MG tablet   metoprolol tartrate (LOPRESSOR) 25 MG tablet   metFORMIN (  GLUCOPHAGE) 1000 MG tablet   losartan (COZAAR) 50 MG tablet   Insulin Detemir (LEVEMIR) 100 UNIT/ML Pen   atorvastatin (LIPITOR) 20 MG tablet   insulin aspart (NOVOLOG FLEXPEN) 100 UNIT/ML FlexPen   Depression    PHQ-9 is 5/27. Well controlled. Renewed Wellbutrin.       Relevant Medications   buPROPion (WELLBUTRIN XL) 150 MG 24 hr tablet   BMI 40.0-44.9, adult    Pt has joined weight watchers to help reduce BMI.  Encouraged [employer] to help with food choices. Goal exercise 150 minutes (30-68minutes, 3-4x per week).        Other Visit Diagnoses    Colon cancer screening        Referred for colonoscopy.     Relevant Orders    Ambulatory referral to General Surgery    Need for pneumococcal vaccination        Due for Prevnar. Given today.      Relevant Orders    Pneumococcal conjugate vaccine 13-valent       Meds ordered this encounter  Medications  . aspirin EC 81 MG tablet    Sig: Take 81 mg by mouth daily.  [name] DISCONTD: Cholecalciferol (D 2000) 2000 UNITS TABS    Sig: Take 2,000 Units by mouth daily.  [name] amLODipine (NORVASC) 5 MG tablet    Sig: Take 5 mg by mouth daily.  [name] DISCONTD: atorvastatin (LIPITOR) 20 MG tablet    Sig: Take 20 mg by mouth daily.  [name] DISCONTD: buPROPion (WELLBUTRIN XL) 150 MG 24 hr tablet    Sig: Take 150 mg by mouth daily.  [name] DISCONTD: empagliflozin (JARDIANCE) 25 MG TABS tablet    Sig: Take 25 mg by mouth daily.  [name] DISCONTD: metFORMIN (GLUCOPHAGE) 1000 MG tablet    Sig: Take 1,000 mg by mouth 2 (two) times daily.  [name] DISCONTD: metoprolol tartrate (LOPRESSOR) 25 MG tablet    Sig: Take 25 mg by mouth 2 (two) times daily.  [name] DISCONTD: losartan (COZAAR) 50 MG tablet    Sig: Take 50 mg by mouth daily.  . meclizine (ANTIVERT) 25 MG tablet    Sig: Take 25 mg by mouth 3 (three) times daily as needed.  [name] DISCONTD: amLODipine (NORVASC) 5 MG tablet    Sig: Take 5 mg by mouth daily.  [name] DISCONTD: Insulin Detemir (LEVEMIR) 100 UNIT/ML Pen    Sig: Inject 100 mLs into the skin 2 (two) times daily. Inject 50 units am and 36 pm.  . DISCONTD: Insulin Pen Needle 32G X 4 MM MISC    Sig: 1 each by Other route 2 (two) times daily.  . Insulin Pen Needle 32G X 4 MM MISC    Sig: 1 each by Other route 2 (two) times daily.    Dispense:  100 each    Refill:  11    Order Specific Question:  Supervising Provider    Answer:  Arlis Porta [phone]  . metoprolol tartrate (LOPRESSOR) 25 MG tablet    Sig: Take 1 tablet (25 mg total) by mouth 2 (two) times daily.    Dispense:  180 tablet    Refill:  3    Order Specific Question:  Supervising Provider    Answer:  Arlis Porta [phone]  . metFORMIN (GLUCOPHAGE) 1000 MG tablet    Sig: Take 1 tablet (1,000 mg total) by mouth 2 (two) times daily.    Dispense:  180  tablet    Refill:  3  Order Specific Question:  Supervising Provider    Answer:  Arlis Porta [phone]  . losartan (COZAAR) 50 MG tablet    Sig: Take 1 tablet (50 mg total) by mouth daily.    Dispense:  90 tablet    Refill:  3    Order Specific Question:  Supervising Provider    Answer:  Arlis Porta [phone]  . Insulin Detemir (LEVEMIR) 100 UNIT/ML Pen    Sig: Inject 50 units am and 36 pm.    Dispense:  90 mL    Refill:  3    Order Specific Question:  Supervising Provider    Answer:  Arlis Porta [phone]  . buPROPion (WELLBUTRIN XL) 150 MG 24 hr tablet    Sig: Take 1 tablet (150 mg total) by mouth daily.    Dispense:  90 tablet    Refill:  3    Order Specific Question:  Supervising Provider    Answer:  Arlis Porta [phone]  . atorvastatin (LIPITOR) 20 MG tablet    Sig: Take 1 tablet (20 mg total) by mouth daily.    Dispense:  90 tablet    Refill:  3    Order Specific Question:  Supervising Provider    Answer:  Arlis Porta [phone]  . insulin aspart (NOVOLOG FLEXPEN) 100 UNIT/ML FlexPen    Sig: Inject 6 Units into the skin 3 (three) times daily with meals.    Dispense:  15 mL    Refill:  11    Order Specific Question:  Supervising Provider    Answer:  Arlis Porta [phone]      Follow up plan: Return in about 3 weeks (around 08/02/2015) for BG check with Dr. Luan Pulling.

## 2015-07-12 NOTE — Assessment & Plan Note (Addendum)
Controlled in office today, considering pt has not taken medications. Discussed DASH diet. BP goal < 140/90. Alarm precautions reviewed.  Last GFR 53. CMP done today. ARB for renal protection.

## 2015-07-12 NOTE — Assessment & Plan Note (Signed)
Renewed atorvastatin. Check lipid panel today. Heart Healthy diet reviewed.

## 2015-07-12 NOTE — Assessment & Plan Note (Signed)
PHQ-9 is 5/27. Well controlled. Renewed Wellbutrin.

## 2015-07-14 ENCOUNTER — Telehealth: Payer: Self-pay

## 2015-07-14 ENCOUNTER — Other Ambulatory Visit: Payer: Self-pay

## 2015-07-14 NOTE — Telephone Encounter (Signed)
Gastroenterology Pre-Procedure Review  Request Date: 08-19-15 Requesting Physician: Fredia Sorrow, NP  PATIENT REVIEW QUESTIONS: The patient responded to the following health history questions as indicated:    1. Are you having any GI issues? yes (Diarrhea issues) 2. Do you have a personal history of Polyps? no 3. Do you have a family history of Colon Cancer or Polyps? no 4. Diabetes Mellitus? yes (type 2) 5. Joint replacements in the past 12 months?no 6. Major health problems in the past 3 months?no 7. Any artificial heart valves, MVP, or defibrillator?no    MEDICATIONS & ALLERGIES:    Patient reports the following regarding taking any anticoagulation/antiplatelet therapy:   Plavix, Coumadin, Eliquis, Xarelto, Lovenox, Pradaxa, Brilinta, or Effient? no Aspirin? yes (ASA 81mg )  Patient confirms/reports the following medications:  Current Outpatient Prescriptions  Medication Sig Dispense Refill  . amLODipine (NORVASC) 5 MG tablet Take 5 mg by mouth daily.    [name] aspirin EC 81 MG tablet Take 81 mg by mouth daily.    [name] atorvastatin (LIPITOR) 20 MG tablet Take 1 tablet (20 mg total) by mouth daily. 90 tablet 3  . buPROPion (WELLBUTRIN XL) 150 MG 24 hr tablet Take 1 tablet (150 mg total) by mouth daily. 90 tablet 3  . insulin aspart (NOVOLOG FLEXPEN) 100 UNIT/ML FlexPen Inject 6 Units into the skin 3 (three) times daily with meals. 15 mL 11  . Insulin Detemir (LEVEMIR) 100 UNIT/ML Pen Inject 50 units am and 36 pm. 90 mL 3  . Insulin Pen Needle 32G X 4 MM MISC 1 each by Other route 2 (two) times daily. 100 each 11  . losartan (COZAAR) 50 MG tablet Take 1 tablet (50 mg total) by mouth daily. 90 tablet 3  . meclizine (ANTIVERT) 25 MG tablet Take 25 mg by mouth 3 (three) times daily as needed.    . metFORMIN (GLUCOPHAGE) 1000 MG tablet Take 1 tablet (1,000 mg total) by mouth 2 (two) times daily. 180 tablet 3  . metoprolol tartrate (LOPRESSOR) 25 MG tablet Take 1 tablet (25 mg total) by mouth 2 (two)  times daily. 180 tablet 3   No current facility-administered medications for this visit.    Patient confirms/reports the following allergies:  Allergies  Allergen Reactions  . Iodine Hives    No orders of the defined types were placed in this encounter.    AUTHORIZATION INFORMATION Primary Insurance: 1D#: Group #:  Secondary Insurance: 1D#: Group #:  SCHEDULE INFORMATION: Date: 08-19-15 Time: Location: [place]

## 2015-07-19 LAB — MICROALBUMIN / CREATININE URINE RATIO
Creatinine, Urine: 41.3 mg/dL
MICROALB/CREAT RATIO: 442.4 mg/g{creat} — AB (ref 0.0–30.0)
MICROALBUM., U, RANDOM: 182.7 ug/mL

## 2015-07-20 ENCOUNTER — Telehealth: Payer: Self-pay | Admitting: Family Medicine

## 2015-07-20 LAB — COMPREHENSIVE METABOLIC PANEL
ALBUMIN: 4.5 g/dL (ref 3.6–4.8)
ALK PHOS: 100 IU/L (ref 39–117)
ALT: 25 IU/L (ref 0–32)
AST: 21 IU/L (ref 0–40)
Albumin/Globulin Ratio: 1.7 (ref 1.1–2.5)
BILIRUBIN TOTAL: 0.4 mg/dL (ref 0.0–1.2)
BUN / CREAT RATIO: 21 (ref 11–26)
BUN: 23 mg/dL (ref 8–27)
CHLORIDE: 98 mmol/L (ref 97–108)
CO2: 24 mmol/L (ref 18–29)
Calcium: 9.9 mg/dL (ref 8.7–10.3)
Creatinine, Ser: 1.11 mg/dL — ABNORMAL HIGH (ref 0.57–1.00)
GFR calc Af Amer: 59 mL/min/{1.73_m2} — ABNORMAL LOW (ref >59)
GFR calc non Af Amer: 51 mL/min/{1.73_m2} — ABNORMAL LOW (ref >59)
GLUCOSE: 217 mg/dL — AB (ref 65–99)
Globulin, Total: 2.6 g/dL (ref 1.5–4.5)
Potassium: 4.5 mmol/L (ref 3.5–5.2)
Sodium: 142 mmol/L (ref 134–144)
Total Protein: 7.1 g/dL (ref 6.0–8.5)

## 2015-07-20 LAB — LIPID PANEL
CHOLESTEROL TOTAL: 112 mg/dL (ref 100–199)
Chol/HDL Ratio: 3.1 ratio units (ref 0.0–4.4)
HDL: 36 mg/dL — ABNORMAL LOW (ref >39)
LDL Calculated: 33 mg/dL (ref 0–99)
Triglycerides: 217 mg/dL — ABNORMAL HIGH (ref 0–149)
VLDL CHOLESTEROL CAL: 43 mg/dL — AB (ref 5–40)

## 2015-07-20 NOTE — Telephone Encounter (Signed)
Called pt to review lab results. LMTCB. Pt called back immediately.   Lipid panel: Cholesterol is doing well. Please continue to take atorvastatin. CMP: Kidney function has improved from last lab check. Still consistent with CKD stage 3. Microalbumin in urine- defined and explained treatment to patient.   Pt reports sugars are doing better and she is adjusting to mealtime insulin. No episodes of hypoglycemia.

## 2015-08-04 ENCOUNTER — Ambulatory Visit: Payer: Medicare Other | Admitting: Family Medicine

## 2015-08-12 ENCOUNTER — Encounter: Payer: Self-pay | Admitting: *Deleted

## 2015-08-12 ENCOUNTER — Other Ambulatory Visit: Payer: Self-pay | Admitting: Family Medicine

## 2015-08-18 NOTE — Discharge Instructions (Signed)

## 2015-08-19 ENCOUNTER — Ambulatory Visit
Admission: RE | Admit: 2015-08-19 | Discharge: 2015-08-19 | Disposition: A | Discharge status: Home / Self Care | Payer: Medicare Other | Source: Ambulatory Visit | Attending: Gastroenterology | Admitting: Gastroenterology

## 2015-08-19 ENCOUNTER — Ambulatory Visit: Payer: Medicare Other | Admitting: Anesthesiology

## 2015-08-19 ENCOUNTER — Encounter: Payer: Self-pay | Admitting: *Deleted

## 2015-08-19 ENCOUNTER — Other Ambulatory Visit: Payer: Self-pay | Admitting: Gastroenterology

## 2015-08-19 ENCOUNTER — Encounter
Admission: RE | Disposition: A | Discharge status: Home / Self Care | Payer: Self-pay | Source: Ambulatory Visit | Attending: Gastroenterology

## 2015-08-19 DIAGNOSIS — D125 Benign neoplasm of sigmoid colon: Secondary | ICD-10-CM | POA: Insufficient documentation

## 2015-08-19 DIAGNOSIS — Z7982 Long term (current) use of aspirin: Secondary | ICD-10-CM | POA: Diagnosis not present

## 2015-08-19 DIAGNOSIS — K635 Polyp of colon: Secondary | ICD-10-CM | POA: Diagnosis not present

## 2015-08-19 DIAGNOSIS — Z91041 Radiographic dye allergy status: Secondary | ICD-10-CM | POA: Insufficient documentation

## 2015-08-19 DIAGNOSIS — Z1211 Encounter for screening for malignant neoplasm of colon: Secondary | ICD-10-CM | POA: Insufficient documentation

## 2015-08-19 DIAGNOSIS — E119 Type 2 diabetes mellitus without complications: Secondary | ICD-10-CM | POA: Diagnosis not present

## 2015-08-19 DIAGNOSIS — I10 Essential (primary) hypertension: Secondary | ICD-10-CM | POA: Insufficient documentation

## 2015-08-19 DIAGNOSIS — K640 First degree hemorrhoids: Secondary | ICD-10-CM | POA: Diagnosis not present

## 2015-08-19 DIAGNOSIS — Z87891 Personal history of nicotine dependence: Secondary | ICD-10-CM | POA: Diagnosis not present

## 2015-08-19 DIAGNOSIS — D123 Benign neoplasm of transverse colon: Secondary | ICD-10-CM | POA: Insufficient documentation

## 2015-08-19 DIAGNOSIS — E785 Hyperlipidemia, unspecified: Secondary | ICD-10-CM | POA: Diagnosis not present

## 2015-08-19 DIAGNOSIS — Z794 Long term (current) use of insulin: Secondary | ICD-10-CM | POA: Diagnosis not present

## 2015-08-19 HISTORY — PX: COLONOSCOPY WITH PROPOFOL: SHX5780

## 2015-08-19 HISTORY — PX: POLYPECTOMY: SHX5525

## 2015-08-19 LAB — GLUCOSE, CAPILLARY
GLUCOSE-CAPILLARY: 217 mg/dL — AB (ref 65–99)
Glucose-Capillary: 198 mg/dL — ABNORMAL HIGH (ref 65–99)

## 2015-08-19 SURGERY — COLONOSCOPY WITH PROPOFOL
Anesthesia: Monitor Anesthesia Care | Wound class: Contaminated

## 2015-08-19 MED ORDER — PROPOFOL 10 MG/ML IV BOLUS
INTRAVENOUS | Status: DC | PRN
  Administered 2015-08-19: 20 mg via INTRAVENOUS
  Administered 2015-08-19 (×2): 100 mg via INTRAVENOUS

## 2015-08-19 MED ORDER — ACETAMINOPHEN 160 MG/5ML PO SOLN: 325.0000 mg | ORAL | Status: DC | PRN

## 2015-08-19 MED ORDER — OXYCODONE HCL 5 MG PO TABS: 5.0000 mg | ORAL_TABLET | Freq: Once | ORAL | Status: DC | PRN

## 2015-08-19 MED ORDER — HYDROMORPHONE HCL 1 MG/ML IJ SOLN
0.2500 mg | INTRAMUSCULAR | Status: DC | PRN
Start: 2015-08-19 — End: 2015-08-19

## 2015-08-19 MED ORDER — STERILE WATER FOR IRRIGATION IR SOLN
Status: DC | PRN
  Administered 2015-08-19: 08:00:00

## 2015-08-19 MED ORDER — LIDOCAINE HCL (CARDIAC) 20 MG/ML IV SOLN
INTRAVENOUS | Status: DC | PRN
  Administered 2015-08-19: 30 mg via INTRAVENOUS

## 2015-08-19 MED ORDER — ONDANSETRON HCL 4 MG/2ML IJ SOLN: 4.0000 mg | Freq: Once | INTRAMUSCULAR | Status: DC | PRN

## 2015-08-19 MED ORDER — OXYCODONE HCL 5 MG/5ML PO SOLN: 5.0000 mg | Freq: Once | ORAL | Status: DC | PRN

## 2015-08-19 MED ORDER — ACETAMINOPHEN 325 MG PO TABS: 325.0000 mg | ORAL_TABLET | ORAL | Status: DC | PRN

## 2015-08-19 MED ORDER — LACTATED RINGERS IV SOLN
INTRAVENOUS | Status: DC
  Administered 2015-08-19 (×2): via INTRAVENOUS

## 2015-08-19 SURGICAL SUPPLY — 28 items

## 2015-08-19 NOTE — Op Note (Signed)
[facility] Gastroenterology Patient Name: Heather Lopez Procedure Date: 08/19/2015 7:21 AM MRN: [id number] Account #: [id number] Date of Birth: 09/11/1946 Admit Type: Outpatient Age: 69 Room: [facility] OR ROOM 01 Gender: Female Note Status: Finalized Procedure:         Colonoscopy Indications:       Screening for colorectal malignant neoplasm Providers:         Lucilla Lame, MD Referring MD:      Leata Mouse (Referring MD) Medicines:         Propofol per Anesthesia Complications:     No immediate complications. Procedure:         Pre-Anesthesia Assessment:                    - Prior to the procedure, a History and Physical was                     performed, and patient medications and allergies were                     reviewed. The patient's tolerance of previous anesthesia                     was also reviewed. The risks and benefits of the procedure                     and the sedation options and risks were discussed with the                     patient. All questions were answered, and informed consent                     was obtained. Prior Anticoagulants: The patient has taken                     no previous anticoagulant or antiplatelet agents. ASA                     Grade Assessment: II - A patient with mild systemic                     disease. After reviewing the risks and benefits, the                     patient was deemed in satisfactory condition to undergo                     the procedure.                    After obtaining informed consent, the colonoscope was                     passed under direct vision. Throughout the procedure, the                     patient's blood pressure, pulse, and oxygen saturations                     were monitored continuously. The Olympus CF H180AL                     colonoscope (S#: [id number]) was introduced through the anus  and advanced to the the cecum, identified by appendiceal   orifice and ileocecal valve. The colonoscopy was performed                     without difficulty. The patient tolerated the procedure                     well. The quality of the bowel preparation was excellent. Findings:      A 5 mm polyp was found in the ascending colon. The polyp was sessile.       The polyp was removed with a cold biopsy forceps. Resection and       retrieval were complete.      A 3 mm polyp was found in the sigmoid colon. The polyp was sessile. The       polyp was removed with a cold biopsy forceps. Resection and retrieval       were complete.      A 5 mm polyp was found in the transverse colon. The polyp was sessile.       The polyp was removed with a cold snare. Resection and retrieval were       complete.      Non-bleeding internal hemorrhoids were found during retroflexion. The       hemorrhoids were Grade I (internal hemorrhoids that do not prolapse). Impression:        - One 5 mm polyp in the ascending colon. Resected and                     retrieved.                    - One 3 mm polyp in the sigmoid colon. Resected and                     retrieved.                    - One 5 mm polyp in the transverse colon. Resected and                     retrieved.                    - Non-bleeding internal hemorrhoids. Recommendation:    - Await pathology results.                    - Repeat colonoscopy in 5 years if polyp adenoma and 10                     years if hyperplastic Procedure Code(s): --- Professional ---                    [phone], Colonoscopy, flexible; with removal of tumor(s),                     polyp(s), or other lesion(s) by snare technique                    45380, 20, Colonoscopy, flexible; with biopsy, single or                     multiple Diagnosis Code(s): --- Professional ---                    Z12.11, Encounter for screening for  malignant neoplasm of                     colon                    D12.2, Benign neoplasm of ascending colon                     D12.5, Benign neoplasm of sigmoid colon                    D12.3, Benign neoplasm of transverse colon CPT copyright 2014 American [employer] Association. All rights reserved. The codes documented in this report are preliminary and upon coder review may  be revised to meet current compliance requirements. Lucilla Lame, MD 08/19/2015 8:26:59 AM This report has been signed electronically. Number of Addenda: 0 Note Initiated On: 08/19/2015 7:21 AM Scope Withdrawal Time: 0 hours 7 minutes 53 seconds  Total Procedure Duration: 0 hours 15 minutes 8 seconds       [facility]

## 2015-08-19 NOTE — H&P (Signed)
[facility]  [address], [place],  [address] Phone: [phone] Fax : [phone]  Primary Care Physician:  Leata Mouse, NP Primary Gastroenterologist:  Dr. Allen Norris  Pre-Procedure History & Physical: HPI:  Heather Lopez is a 69 y.o. female is here for a screening colonoscopy.   Past Medical History  Diagnosis Date  . Diabetes mellitus without complication   . Hypertension   . Hyperlipidemia   . Vertigo     Past Surgical History  Procedure Laterality Date  . Tubal ligation  1992    Prior to Admission medications   Medication Sig Start Date End Date Taking? Authorizing Provider  amLODipine (NORVASC) 5 MG tablet Take 5 mg by mouth daily. 09/28/14  Yes Historical Provider, MD  aspirin EC 81 MG tablet Take 81 mg by mouth daily.   Yes Historical Provider, MD  atorvastatin (LIPITOR) 20 MG tablet Take 1 tablet (20 mg total) by mouth daily. 07/12/15  Yes Amy Overton Mam, NP  buPROPion (WELLBUTRIN XL) 150 MG 24 hr tablet Take 1 tablet (150 mg total) by mouth daily. 07/12/15  Yes Amy Overton Mam, NP  insulin aspart (NOVOLOG FLEXPEN) 100 UNIT/ML FlexPen Inject 6 Units into the skin 3 (three) times daily with meals. 07/12/15  Yes Amy Overton Mam, NP  Insulin Detemir (LEVEMIR) 100 UNIT/ML Pen Inject 50 units am and 36 pm. 07/12/15  Yes Amy Overton Mam, NP  Insulin Pen Needle 32G X 4 MM MISC 1 each by Other route 2 (two) times daily. 07/12/15  Yes Amy Overton Mam, NP  losartan (COZAAR) 50 MG tablet Take 1 tablet (50 mg total) by mouth daily. 07/12/15  Yes Amy Overton Mam, NP  meclizine (ANTIVERT) 25 MG tablet Take 25 mg by mouth 3 (three) times daily as needed. 03/04/15  Yes Historical Provider, MD  metFORMIN (GLUCOPHAGE) 1000 MG tablet Take 1 tablet (1,000 mg total) by mouth 2 (two) times daily. 07/12/15  Yes Amy Overton Mam, NP  metoprolol tartrate (LOPRESSOR) 25 MG tablet Take 1 tablet (25 mg total) by mouth 2 (two) times daily. 07/12/15  Yes Amy Overton Mam, NP     Allergies as of 07/14/2015 - Review Complete 07/12/2015  Allergen Reaction Noted  . Iodine Hives 07/12/2015    Family History  Problem Relation Age of Onset  . Diabetes Father   . Cancer Father     lung cancer  . Diabetes Brother     Social History   Social History  . Marital Status: Married    Spouse Name: N/A  . Number of Children: N/A  . Years of Education: N/A   Occupational History  . Not on file.   Social History Main Topics  . Smoking status: Former Smoker -- 0.75 packs/day for 6 years    Types: Cigarettes  . Smokeless tobacco: Not on file     Comment: quit 1973  . Alcohol Use: No  . Drug Use: No  . Sexual Activity: Not on file   Other Topics Concern  . Not on file   Social History Narrative    Review of Systems: See HPI, otherwise negative ROS  Physical Exam: BP 154/66 mmHg  Pulse 70  Temp(Src) 97.9 F (36.6 C) (Temporal)  Resp 18  Ht 5\' 4"  (1.626 m)  Wt 227 lb (102.967 kg)  BMI 38.95 kg/m2  SpO2 95% General:   Alert,  pleasant and cooperative in NAD Head:  Normocephalic and atraumatic. Neck:  Supple; no masses or thyromegaly. Lungs:  Clear throughout to auscultation.    Heart:  Regular rate and rhythm. Abdomen:  Soft, nontender and nondistended. Normal bowel sounds, without guarding, and without rebound.   Neurologic:  Alert and  oriented x4;  grossly normal neurologically.  Impression/Plan: Heather Lopez is now here to undergo a screening colonoscopy.  Risks, benefits, and alternatives regarding colonoscopy have been reviewed with the patient.  Questions have been answered.  All parties agreeable.

## 2015-08-19 NOTE — Transfer of Care (Signed)
Immediate Anesthesia Transfer of Care Note  Patient: Heather Lopez  Procedure(s) Performed: Procedure(s) with comments: COLONOSCOPY WITH PROPOFOL (N/A) - diabetic - insulin POLYPECTOMY  Patient Location: PACU  Anesthesia Type: MAC  Level of Consciousness: awake, alert  and patient cooperative  Airway and Oxygen Therapy: Patient Spontanous Breathing and Patient connected to supplemental oxygen  Post-op Assessment: Post-op Vital signs reviewed, Patient's Cardiovascular Status Stable, Respiratory Function Stable, Patent Airway and No signs of Nausea or vomiting  Post-op Vital Signs: Reviewed and stable  Complications: No apparent anesthesia complications

## 2015-08-19 NOTE — Anesthesia Preprocedure Evaluation (Addendum)
Anesthesia Evaluation  Patient identified by MRN, date of birth, ID band Patient awake    Reviewed: Allergy & Precautions, H&P , NPO status , Patient's Chart, lab work & pertinent test results  Airway Mallampati: III  TM Distance: >3 FB Neck ROM: full    Dental no notable dental hx.    Pulmonary former smoker,    Pulmonary exam normal        Cardiovascular hypertension, Normal cardiovascular exam     Neuro/Psych    GI/Hepatic negative GI ROS, Neg liver ROS,   Endo/Other  diabetes  Renal/GU      Musculoskeletal   Abdominal   Peds  Hematology negative hematology ROS (+)   Anesthesia Other Findings   Reproductive/Obstetrics                           Anesthesia Physical Anesthesia Plan  ASA: III  Anesthesia Plan: MAC   Post-op Pain Management: MAC Combined w/ Regional for Post-op pain   Induction: Intravenous  Airway Management Planned: Nasal Cannula and Simple Face Mask  Additional Equipment:   Intra-op Plan:   Post-operative Plan:   Informed Consent: I have reviewed the patients History and Physical, chart, labs and discussed the procedure including the risks, benefits and alternatives for the proposed anesthesia with the patient or authorized representative who has indicated his/her understanding and acceptance.     Plan Discussed with: CRNA  Anesthesia Plan Comments:       Anesthesia Quick Evaluation

## 2015-08-19 NOTE — Anesthesia Procedure Notes (Signed)
Procedure Name: MAC Performed by: LEBLANC, MONIQUE Pre-anesthesia Checklist: Patient identified, Emergency Drugs available, Suction available, Patient being monitored and Timeout performed Patient Re-evaluated:Patient Re-evaluated prior to inductionOxygen Delivery Method: Nasal cannula       

## 2015-08-19 NOTE — Anesthesia Postprocedure Evaluation (Signed)
  Anesthesia Post-op Note  Patient: Heather Lopez  Procedure(s) Performed: Procedure(s) with comments: COLONOSCOPY WITH PROPOFOL (N/A) - diabetic - insulin POLYPECTOMY  Anesthesia type:MAC  Patient location: PACU  Post pain: Pain level controlled  Post assessment: Post-op Vital signs reviewed, Patient's Cardiovascular Status Stable, Respiratory Function Stable, Patent Airway and No signs of Nausea or vomiting  Post vital signs: Reviewed and stable  Last Vitals:  Filed Vitals:   08/19/15 0835  BP: 134/76  Pulse: 65  Temp:   Resp: 18    Level of consciousness: awake, alert  and patient cooperative  Complications: No apparent anesthesia complications

## 2015-08-22 ENCOUNTER — Encounter: Payer: Self-pay | Admitting: Gastroenterology

## 2015-08-24 ENCOUNTER — Ambulatory Visit: Payer: Medicare Other | Admitting: Family Medicine

## 2015-08-24 ENCOUNTER — Encounter: Payer: Self-pay | Admitting: Gastroenterology

## 2015-09-09 ENCOUNTER — Ambulatory Visit: Payer: Medicare Other | Admitting: Family Medicine

## 2015-10-12 LAB — HM DIABETES EYE EXAM

## 2015-10-13 ENCOUNTER — Ambulatory Visit: Payer: Medicare Other | Admitting: Family Medicine

## 2015-10-17 ENCOUNTER — Encounter: Payer: Self-pay | Admitting: Family Medicine

## 2015-11-14 ENCOUNTER — Ambulatory Visit: Payer: Medicare Other | Admitting: Family Medicine

## 2016-05-21 LAB — HM DIABETES EYE EXAM

## 2016-06-20 ENCOUNTER — Other Ambulatory Visit: Payer: Self-pay | Admitting: Family Medicine

## 2016-06-20 DIAGNOSIS — IMO0002 Reserved for concepts with insufficient information to code with codable children: Secondary | ICD-10-CM

## 2016-06-20 DIAGNOSIS — E1165 Type 2 diabetes mellitus with hyperglycemia: Secondary | ICD-10-CM

## 2016-06-20 DIAGNOSIS — Z794 Long term (current) use of insulin: Principal | ICD-10-CM

## 2016-07-12 ENCOUNTER — Other Ambulatory Visit: Payer: Self-pay | Admitting: Family Medicine

## 2016-07-12 DIAGNOSIS — N183 Chronic kidney disease, stage 3 unspecified: Secondary | ICD-10-CM

## 2016-07-12 DIAGNOSIS — F32A Depression, unspecified: Secondary | ICD-10-CM

## 2016-07-12 DIAGNOSIS — IMO0002 Reserved for concepts with insufficient information to code with codable children: Secondary | ICD-10-CM

## 2016-07-12 DIAGNOSIS — E1169 Type 2 diabetes mellitus with other specified complication: Secondary | ICD-10-CM

## 2016-07-12 DIAGNOSIS — I129 Hypertensive chronic kidney disease with stage 1 through stage 4 chronic kidney disease, or unspecified chronic kidney disease: Secondary | ICD-10-CM

## 2016-07-12 DIAGNOSIS — E785 Hyperlipidemia, unspecified: Secondary | ICD-10-CM

## 2016-07-12 DIAGNOSIS — Z794 Long term (current) use of insulin: Principal | ICD-10-CM

## 2016-07-12 DIAGNOSIS — E1165 Type 2 diabetes mellitus with hyperglycemia: Secondary | ICD-10-CM

## 2016-07-12 DIAGNOSIS — F329 Major depressive disorder, single episode, unspecified: Secondary | ICD-10-CM

## 2016-07-13 ENCOUNTER — Other Ambulatory Visit: Payer: Self-pay | Admitting: Family Medicine

## 2016-07-13 DIAGNOSIS — E1169 Type 2 diabetes mellitus with other specified complication: Secondary | ICD-10-CM

## 2016-07-13 DIAGNOSIS — N183 Chronic kidney disease, stage 3 unspecified: Secondary | ICD-10-CM

## 2016-07-13 DIAGNOSIS — E785 Hyperlipidemia, unspecified: Secondary | ICD-10-CM

## 2016-07-13 DIAGNOSIS — F32A Depression, unspecified: Secondary | ICD-10-CM

## 2016-07-13 DIAGNOSIS — Z794 Long term (current) use of insulin: Secondary | ICD-10-CM

## 2016-07-13 DIAGNOSIS — IMO0002 Reserved for concepts with insufficient information to code with codable children: Secondary | ICD-10-CM

## 2016-07-13 DIAGNOSIS — I129 Hypertensive chronic kidney disease with stage 1 through stage 4 chronic kidney disease, or unspecified chronic kidney disease: Secondary | ICD-10-CM

## 2016-07-13 DIAGNOSIS — E1165 Type 2 diabetes mellitus with hyperglycemia: Secondary | ICD-10-CM

## 2016-07-13 DIAGNOSIS — F329 Major depressive disorder, single episode, unspecified: Secondary | ICD-10-CM

## 2016-07-13 MED ORDER — METFORMIN HCL 1000 MG PO TABS: 1000.0000 mg | ORAL_TABLET | Freq: Two times a day (BID) | ORAL | 0 refills | Status: DC

## 2016-07-13 MED ORDER — METOPROLOL TARTRATE 25 MG PO TABS: 25.0000 mg | ORAL_TABLET | Freq: Two times a day (BID) | ORAL | 0 refills | Status: DC

## 2016-07-13 MED ORDER — ATORVASTATIN CALCIUM 20 MG PO TABS: 20.0000 mg | ORAL_TABLET | Freq: Every day | ORAL | 0 refills | Status: DC

## 2016-07-13 MED ORDER — BUPROPION HCL ER (XL) 150 MG PO TB24: 150.0000 mg | ORAL_TABLET | Freq: Every day | ORAL | 0 refills | Status: DC

## 2016-07-13 MED ORDER — LOSARTAN POTASSIUM 50 MG PO TABS: 50.0000 mg | ORAL_TABLET | Freq: Every day | ORAL | 0 refills | Status: DC

## 2016-07-13 NOTE — Progress Notes (Signed)
Patient aware that she is needs office visit. She was driving and will call back to schedule. She was informed no further refill until seen.

## 2016-07-17 ENCOUNTER — Telehealth: Payer: Self-pay

## 2016-07-17 DIAGNOSIS — F329 Major depressive disorder, single episode, unspecified: Secondary | ICD-10-CM

## 2016-07-17 DIAGNOSIS — I129 Hypertensive chronic kidney disease with stage 1 through stage 4 chronic kidney disease, or unspecified chronic kidney disease: Secondary | ICD-10-CM

## 2016-07-17 DIAGNOSIS — N183 Chronic kidney disease, stage 3 unspecified: Secondary | ICD-10-CM

## 2016-07-17 DIAGNOSIS — E785 Hyperlipidemia, unspecified: Principal | ICD-10-CM

## 2016-07-17 DIAGNOSIS — F32A Depression, unspecified: Secondary | ICD-10-CM

## 2016-07-17 DIAGNOSIS — E1165 Type 2 diabetes mellitus with hyperglycemia: Secondary | ICD-10-CM

## 2016-07-17 DIAGNOSIS — E1169 Type 2 diabetes mellitus with other specified complication: Secondary | ICD-10-CM

## 2016-07-17 DIAGNOSIS — Z794 Long term (current) use of insulin: Secondary | ICD-10-CM

## 2016-07-17 DIAGNOSIS — IMO0002 Reserved for concepts with insufficient information to code with codable children: Secondary | ICD-10-CM

## 2016-07-17 MED ORDER — INSULIN DETEMIR 100 UNIT/ML FLEXPEN: PEN_INJECTOR | SUBCUTANEOUS | 0 refills | Status: DC

## 2016-07-17 MED ORDER — ATORVASTATIN CALCIUM 20 MG PO TABS: 20.0000 mg | ORAL_TABLET | Freq: Every day | ORAL | 0 refills | Status: DC

## 2016-07-17 MED ORDER — LOSARTAN POTASSIUM 50 MG PO TABS: 50.0000 mg | ORAL_TABLET | Freq: Every day | ORAL | 0 refills | Status: DC

## 2016-07-17 MED ORDER — BUPROPION HCL ER (XL) 150 MG PO TB24: 150.0000 mg | ORAL_TABLET | Freq: Every day | ORAL | 0 refills | Status: DC

## 2016-07-17 MED ORDER — METOPROLOL TARTRATE 25 MG PO TABS: 25.0000 mg | ORAL_TABLET | Freq: Two times a day (BID) | ORAL | 0 refills | Status: DC

## 2016-07-17 MED ORDER — METFORMIN HCL 1000 MG PO TABS: 1000.0000 mg | ORAL_TABLET | Freq: Two times a day (BID) | ORAL | 0 refills | Status: DC

## 2016-07-17 NOTE — Telephone Encounter (Signed)
Patient made appt for 08/21/16 to follow up.  However, she is requesting all her medication be sent to her mail order pharm.  She is aware that the meds were sent to a local pharm but she would like 90 day since she made an appt.  Please advise.

## 2016-07-17 NOTE — Telephone Encounter (Signed)
Patient aware.[place]

## 2016-07-17 NOTE — Telephone Encounter (Signed)
[facility] Please notify patient and I will see her at  9/26 appt. Thank you! AK

## 2016-08-21 ENCOUNTER — Encounter: Payer: Self-pay | Admitting: Family Medicine

## 2016-08-21 ENCOUNTER — Ambulatory Visit (INDEPENDENT_AMBULATORY_CARE_PROVIDER_SITE_OTHER): Payer: Medicare Other | Admitting: Family Medicine

## 2016-08-21 VITALS — BP 136/64 | HR 67 | Temp 98.7°F | Resp 16 | Ht 64.0 in | Wt 247.6 lb

## 2016-08-21 DIAGNOSIS — E1169 Type 2 diabetes mellitus with other specified complication: Secondary | ICD-10-CM

## 2016-08-21 DIAGNOSIS — Z794 Long term (current) use of insulin: Secondary | ICD-10-CM

## 2016-08-21 DIAGNOSIS — N183 Chronic kidney disease, stage 3 unspecified: Secondary | ICD-10-CM

## 2016-08-21 DIAGNOSIS — Z23 Encounter for immunization: Secondary | ICD-10-CM | POA: Diagnosis not present

## 2016-08-21 DIAGNOSIS — I129 Hypertensive chronic kidney disease with stage 1 through stage 4 chronic kidney disease, or unspecified chronic kidney disease: Secondary | ICD-10-CM

## 2016-08-21 DIAGNOSIS — E1165 Type 2 diabetes mellitus with hyperglycemia: Secondary | ICD-10-CM | POA: Diagnosis not present

## 2016-08-21 DIAGNOSIS — K589 Irritable bowel syndrome without diarrhea: Secondary | ICD-10-CM | POA: Diagnosis not present

## 2016-08-21 DIAGNOSIS — B353 Tinea pedis: Secondary | ICD-10-CM

## 2016-08-21 DIAGNOSIS — Z1231 Encounter for screening mammogram for malignant neoplasm of breast: Secondary | ICD-10-CM

## 2016-08-21 DIAGNOSIS — E559 Vitamin D deficiency, unspecified: Secondary | ICD-10-CM | POA: Diagnosis not present

## 2016-08-21 DIAGNOSIS — Z6841 Body Mass Index (BMI) 40.0 and over, adult: Secondary | ICD-10-CM

## 2016-08-21 DIAGNOSIS — E785 Hyperlipidemia, unspecified: Secondary | ICD-10-CM | POA: Diagnosis not present

## 2016-08-21 DIAGNOSIS — IMO0002 Reserved for concepts with insufficient information to code with codable children: Secondary | ICD-10-CM

## 2016-08-21 LAB — POCT UA - MICROALBUMIN: Microalbumin Ur, POC: 50 mg/L

## 2016-08-21 LAB — POCT GLYCOSYLATED HEMOGLOBIN (HGB A1C): Hemoglobin A1C: 10.9

## 2016-08-21 MED ORDER — TERBINAFINE HCL 1 % EX CREA: 1.0000 "application " | TOPICAL_CREAM | Freq: Two times a day (BID) | CUTANEOUS | 0 refills | Status: DC

## 2016-08-21 MED ORDER — INSULIN DETEMIR 100 UNIT/ML FLEXPEN: PEN_INJECTOR | SUBCUTANEOUS | 3 refills | Status: DC

## 2016-08-21 MED ORDER — INSULIN ASPART 100 UNIT/ML FLEXPEN: 10.0000 [IU] | PEN_INJECTOR | Freq: Three times a day (TID) | SUBCUTANEOUS | 3 refills | Status: DC

## 2016-08-21 NOTE — Assessment & Plan Note (Signed)
A1c 10.2%- down from previous but still uncontrolled. Adjust levemir to 44 units at night and novolog 10 units with meals. Encouraged diet and lifestyle changes. Encouraged pt to have regular follow-up for help control her diabetes. Consider endocrinology if unable to get <9% in primary care.  ARB for renal protection. UA micro done today.  Eye exam UTD- will request report from [facility]. Foot exam done today. Recheck 3 mos.

## 2016-08-21 NOTE — Assessment & Plan Note (Signed)
BP controlled today. Continue current regiment. Check CMP to monitor kidney function. ARB for renal protection.

## 2016-08-21 NOTE — Progress Notes (Signed)
Subjective:    Patient ID: Heather Lopez, female    DOB: October 18, 1946, 70 y.o.   MRN: [id number]  HPI: Heather Lopez is a 70 y.o. female presenting on 08/21/2016 for Diabetes (highest 50 and lowest 119 )   HPI  Pt presents for diabetes follow-up. It has been more than 1 year since she has been seen. Had lost 25lbs. But gained it back when her ex-husband diet. AM sugar this AM was 119- she felt low. Sugars have been all over the place. Avg 170 at home. Stopped drinking diet drinks. Trying to drink just water and un sweet. No numbness in feet. No visual changes. Had eye exam 3 mos ago- no retinopathy.  Take 50 units AM and 36 units PM of levemir. 8 units novolog with meals.  IBS- D- Has noticed it worsens in times of stress. Dairy is a trigger. Gets better with lactaid milk. Uses immodium. Gets crampy abdominal pain relieved by BM. Mostly diarrhea. Takes immodium PRN.  No chest pain. No shortness of breath. No dizziness.  Recent stressors. Her ex-husband died. Going to counseling.  Colonoscopy- September 2016- repeat 5 years.   Past Medical History:  Diagnosis Date  . Diabetes mellitus without complication ([place])   . Hyperlipidemia   . Hypertension   . Vertigo     Current Outpatient Prescriptions on File Prior to Visit  Medication Sig  . amLODipine (NORVASC) 5 MG tablet Take 5 mg by mouth daily.  [name] aspirin EC 81 MG tablet Take 81 mg by mouth daily.  [name] atorvastatin (LIPITOR) 20 MG tablet Take 1 tablet (20 mg total) by mouth daily.  [name] buPROPion (WELLBUTRIN XL) 150 MG 24 hr tablet Take 1 tablet (150 mg total) by mouth daily.  . Insulin Pen Needle 32G X 4 MM MISC 1 each by Other route 2 (two) times daily.  [name] losartan (COZAAR) 50 MG tablet Take 1 tablet (50 mg total) by mouth daily.  . meclizine (ANTIVERT) 25 MG tablet Take 25 mg by mouth 3 (three) times daily as needed.  . metFORMIN (GLUCOPHAGE) 1000 MG tablet Take 1 tablet (1,000 mg total) by mouth 2 (two) times daily.  . metoprolol tartrate  (LOPRESSOR) 25 MG tablet Take 1 tablet (25 mg total) by mouth 2 (two) times daily.   No current facility-administered medications on file prior to visit.     Review of Systems  Constitutional: Negative for chills and fever.  HENT: Negative.   Respiratory: Negative for cough, chest tightness and wheezing.   Cardiovascular: Negative for chest pain and leg swelling.  Gastrointestinal: Positive for abdominal pain and diarrhea. Negative for constipation, nausea and vomiting.  Endocrine: Negative.  Negative for cold intolerance, heat intolerance, polydipsia, polyphagia and polyuria.  Genitourinary: Negative for difficulty urinating and dysuria.  Musculoskeletal: Negative.   Neurological: Negative for dizziness, light-headedness and numbness.  Psychiatric/Behavioral: Positive for dysphoric mood. Negative for sleep disturbance and suicidal ideas. The patient is not nervous/anxious.    Per HPI unless specifically indicated above     Objective:    BP 136/64 (BP Location: Left Arm)   Pulse 67   Temp 98.7 F (37.1 C) (Oral)   Resp 16   Ht 5\' 4"  (1.626 m)   Wt 247 lb 9.6 oz (112.3 kg)   BMI 42.50 kg/m   Wt Readings from Last 3 Encounters:  08/21/16 247 lb 9.6 oz (112.3 kg)  08/19/15 227 lb (103 kg)  07/12/15 239 lb (108.4 kg)    Physical  Exam  Constitutional: She is oriented to person, place, and time. She appears well-developed and well-nourished.  HENT:  Head: Normocephalic and atraumatic.  Neck: Neck supple.  Cardiovascular: Normal rate, regular rhythm and normal heart sounds.  Exam reveals no gallop and no friction rub.   No murmur heard. Pulmonary/Chest: Effort normal and breath sounds normal. She has no wheezes. She exhibits no tenderness.  Abdominal: Soft. Normal appearance and bowel sounds are normal. She exhibits no distension and no mass. There is no tenderness. There is no rebound and no guarding.  Musculoskeletal: Normal range of motion. She exhibits no edema or  tenderness.  Lymphadenopathy:    She has no cervical adenopathy.  Neurological: She is alert and oriented to person, place, and time.  Skin: Skin is warm and dry.  White/red scaling rash on bilateral feet.   Psychiatric: She has a normal mood and affect. Her behavior is normal. Judgment and thought content normal. Cognition and memory are normal. She expresses no suicidal ideation. She expresses no suicidal plans.   Results for orders placed or performed in visit on 08/21/16  POCT HgB A1C  Result Value Ref Range   Hemoglobin A1C 10.9   POCT UA - Microalbumin  Result Value Ref Range   Microalbumin Ur, POC 50 mg/L   Creatinine, POC  mg/dL   Albumin/Creatinine Ratio, Urine, POC    HM DIABETES EYE EXAM  Result Value Ref Range   HM Diabetic Eye Exam No Retinopathy No Retinopathy      Assessment & Plan:   Problem List Items Addressed This Visit      Cardiovascular and Mediastinum   Benign hypertension with CKD (chronic kidney disease) stage III    BP controlled today. Continue current regiment. Check CMP to monitor kidney function. ARB for renal protection.         Digestive   IBS (irritable bowel syndrome)    Symptoms consistent with IBS-D. Continue PRN immodium. Recommend trial of avoiding dairy and then gluten to see if symptoms improve. Recommend OTC pro-biotic or probiotic yogurt. Reviewed FODMAP diet.  Recheck 3 mos- referral to GI if symptoms not improved.         Other   Uncontrolled type 2 diabetes mellitus with insulin therapy ([place]) - Primary    A1c 10.2%- down from previous but still uncontrolled. Adjust levemir to 44 units at night and novolog 10 units with meals. Encouraged diet and lifestyle changes. Encouraged pt to have regular follow-up for help control her diabetes. Consider endocrinology if unable to get <9% in primary care.  ARB for renal protection. UA micro done today.  Eye exam UTD- will request report from [facility]. Foot exam done today. Recheck 3  mos.       Relevant Medications   insulin aspart (NOVOLOG FLEXPEN) 100 UNIT/ML FlexPen   Insulin Detemir (LEVEMIR FLEXTOUCH) 100 UNIT/ML Pen   Other Relevant Orders   POCT HgB A1C (Completed)   POCT UA - Microalbumin (Completed)   COMPLETE METABOLIC PANEL WITH GFR   Hyperlipidemia associated with type 2 diabetes mellitus (HCC)    Continue atorvastatin. No myalgias. Encouraged heart healthy diet. Recheck lipids. LDL goal < 70 given her diabetes.       Relevant Medications   insulin aspart (NOVOLOG FLEXPEN) 100 UNIT/ML FlexPen   Insulin Detemir (LEVEMIR FLEXTOUCH) 100 UNIT/ML Pen   Other Relevant Orders   Lipid Profile   BMI 40.0-44.9, adult (HCC)    Pt is trying to lose weight. Loss of 25lbs  with weight watchers but she gained it back. Attending course on weight loss at the hospital. Encouraged continued diet and lifestyle changes to help with weight loss.       Relevant Medications   insulin aspart (NOVOLOG FLEXPEN) 100 UNIT/ML FlexPen   Insulin Detemir (LEVEMIR FLEXTOUCH) 100 UNIT/ML Pen   Vitamin D deficiency   Relevant Orders   Vitamin D (25 hydroxy)    Other Visit Diagnoses    Need for influenza vaccination       Relevant Orders   Flu vaccine HIGH DOSE PF (Fluzone High dose)   Encounter for screening mammogram for breast cancer       Relevant Orders   MM Digital Screening   Tinea pedis of both feet       Treat with lamsil BID for 2 weeks. Return if not improving.    Relevant Medications   terbinafine (LAMISIL) 1 % cream      Meds ordered this encounter  Medications  . insulin aspart (NOVOLOG FLEXPEN) 100 UNIT/ML FlexPen    Sig: Inject 10 Units into the skin 3 (three) times daily with meals.    Dispense:  45 mL    Refill:  3    Order Specific Question:   Supervising Provider    Answer:   Arlis Porta [[id number]]  . Insulin Detemir (LEVEMIR FLEXTOUCH) 100 UNIT/ML Pen    Sig: INJECT 50 UNITS IN THE MORNING AND 44 UNITS IN THE EVENING    Dispense:  45 mL     Refill:  3    Pt needs appt for further refills.    Order Specific Question:   Supervising Provider    Answer:   Arlis Porta [[id number]]  . terbinafine (LAMISIL) 1 % cream    Sig: Apply 1 application topically 2 (two) times daily.    Dispense:  30 g    Refill:  0    Order Specific Question:   Supervising Provider    Answer:   Arlis Porta [[id number]]      Follow up plan: Return in about 3 months (around 11/20/2016), or if symptoms worsen or fail to improve, for diabetes. [name]

## 2016-08-21 NOTE — Assessment & Plan Note (Signed)
Pt is trying to lose weight. Loss of 25lbs with weight watchers but she gained it back. Attending course on weight loss at the hospital. Encouraged continued diet and lifestyle changes to help with weight loss.

## 2016-08-21 NOTE — Patient Instructions (Signed)
  Diabetes: Go up to 44units at bedtime with your levemir- do 42 units this week and 44 next and stay there. Take 10 units with each meal.  Please check your blood glucose 3 times daily. If your glucose is < 70 mg/dl or you have symptoms of hypoglycemia confusion, dizziness, headache, hunger, jitteriness and sweating please drink 4 oz of juice or soda.  Check blood glucose 15 minutes later. If it has not risen to >100, please seek medical attention. If > 100 please eat a snack containing protein such as peanut butter and crackers.  Your goal blood pressure is 140/90 Work on low salt/sodium diet - goal <1.5gm (1,500mg ) per day. Eat a diet high in fruits/vegetables and whole grains.  Look into mediterranean and DASH diet. Goal activity is 153min/wk of moderate intensity exercise.  This can be split into 30 minute chunks.  If you are not at this level, you can start with smaller 10-15 min increments and slowly build up activity. Look at Protection.org for more resources  Please seek immediate medical attention at ER or Urgent Care if you develop: Chest pain, pressure or tightness. Shortness of breath accompanied by nausea or diaphoresis Visual changes Numbness or tingling on one side of the body Facial droop Altered mental status Or any concerning symptoms.

## 2016-08-21 NOTE — Assessment & Plan Note (Addendum)
Symptoms consistent with IBS-D. Continue PRN immodium. Recommend trial of avoiding dairy and then gluten to see if symptoms improve. Recommend OTC pro-biotic or probiotic yogurt. Reviewed FODMAP diet.  Recheck 3 mos- referral to GI if symptoms not improved.

## 2016-08-21 NOTE — Assessment & Plan Note (Signed)
Continue atorvastatin. No myalgias. Encouraged heart healthy diet. Recheck lipids. LDL goal < 70 given her diabetes.

## 2016-08-22 ENCOUNTER — Other Ambulatory Visit: Payer: Self-pay | Admitting: Family Medicine

## 2016-08-22 DIAGNOSIS — N183 Chronic kidney disease, stage 3 unspecified: Secondary | ICD-10-CM

## 2016-08-22 DIAGNOSIS — E1122 Type 2 diabetes mellitus with diabetic chronic kidney disease: Secondary | ICD-10-CM

## 2016-08-22 LAB — COMPLETE METABOLIC PANEL WITH GFR
ALT: 26 U/L (ref 6–29)
AST: 34 U/L (ref 10–35)
Albumin: 4.1 g/dL (ref 3.6–5.1)
Alkaline Phosphatase: 83 U/L (ref 33–130)
BUN: 31 mg/dL — ABNORMAL HIGH (ref 7–25)
CALCIUM: 9.2 mg/dL (ref 8.6–10.4)
CHLORIDE: 103 mmol/L (ref 98–110)
CO2: 22 mmol/L (ref 20–31)
CREATININE: 1.17 mg/dL — AB (ref 0.50–0.99)
GFR, EST AFRICAN AMERICAN: 55 mL/min — AB (ref >=60)
GFR, Est Non African American: 48 mL/min — ABNORMAL LOW (ref >=60)
Glucose, Bld: 129 mg/dL — ABNORMAL HIGH (ref 65–99)
POTASSIUM: 4.5 mmol/L (ref 3.5–5.3)
Sodium: 142 mmol/L (ref 135–146)
Total Bilirubin: 0.5 mg/dL (ref 0.2–1.2)
Total Protein: 6.6 g/dL (ref 6.1–8.1)

## 2016-08-22 LAB — LIPID PANEL
CHOL/HDL RATIO: 2.9 ratio (ref <=5.0)
CHOLESTEROL: 104 mg/dL — AB (ref 125–200)
HDL: 36 mg/dL — AB (ref >=46)
LDL Cholesterol: 8 mg/dL (ref <130)
TRIGLYCERIDES: 301 mg/dL — AB (ref <150)
VLDL: 60 mg/dL — ABNORMAL HIGH (ref <30)

## 2016-08-22 LAB — VITAMIN D 25 HYDROXY (VIT D DEFICIENCY, FRACTURES): VIT D 25 HYDROXY: 29 ng/mL — AB (ref 30–100)

## 2016-08-22 MED ORDER — VITAMIN D (ERGOCALCIFEROL) 1.25 MG (50000 UNIT) PO CAPS: 50000.0000 [IU] | ORAL_CAPSULE | ORAL | 1 refills | Status: DC

## 2016-10-08 DIAGNOSIS — M1611 Unilateral primary osteoarthritis, right hip: Secondary | ICD-10-CM | POA: Diagnosis not present

## 2016-12-04 ENCOUNTER — Ambulatory Visit: Payer: Medicare Other | Admitting: Family Medicine

## 2016-12-06 DIAGNOSIS — M1611 Unilateral primary osteoarthritis, right hip: Secondary | ICD-10-CM | POA: Diagnosis not present

## 2016-12-06 DIAGNOSIS — M1991 Primary osteoarthritis, unspecified site: Secondary | ICD-10-CM | POA: Diagnosis not present

## 2016-12-17 ENCOUNTER — Ambulatory Visit: Payer: Medicare Other | Admitting: Family Medicine

## 2016-12-18 DIAGNOSIS — M1611 Unilateral primary osteoarthritis, right hip: Secondary | ICD-10-CM | POA: Diagnosis not present

## 2016-12-18 DIAGNOSIS — I10 Essential (primary) hypertension: Secondary | ICD-10-CM | POA: Diagnosis not present

## 2016-12-18 DIAGNOSIS — E119 Type 2 diabetes mellitus without complications: Secondary | ICD-10-CM | POA: Diagnosis not present

## 2016-12-20 DIAGNOSIS — M1611 Unilateral primary osteoarthritis, right hip: Secondary | ICD-10-CM | POA: Diagnosis not present

## 2016-12-27 DIAGNOSIS — M25551 Pain in right hip: Secondary | ICD-10-CM | POA: Diagnosis not present

## 2016-12-27 DIAGNOSIS — M1611 Unilateral primary osteoarthritis, right hip: Secondary | ICD-10-CM | POA: Diagnosis not present

## 2016-12-27 DIAGNOSIS — E119 Type 2 diabetes mellitus without complications: Secondary | ICD-10-CM | POA: Diagnosis not present

## 2017-01-07 ENCOUNTER — Encounter: Payer: Self-pay | Admitting: Family Medicine

## 2017-01-07 ENCOUNTER — Ambulatory Visit (INDEPENDENT_AMBULATORY_CARE_PROVIDER_SITE_OTHER): Payer: Medicare Other | Admitting: Family Medicine

## 2017-01-07 VITALS — BP 154/76 | HR 76 | Temp 98.9°F | Resp 16 | Ht 64.0 in | Wt 242.8 lb

## 2017-01-07 DIAGNOSIS — N183 Chronic kidney disease, stage 3 unspecified: Secondary | ICD-10-CM

## 2017-01-07 DIAGNOSIS — Z6841 Body Mass Index (BMI) 40.0 and over, adult: Secondary | ICD-10-CM

## 2017-01-07 DIAGNOSIS — E1165 Type 2 diabetes mellitus with hyperglycemia: Secondary | ICD-10-CM

## 2017-01-07 DIAGNOSIS — I129 Hypertensive chronic kidney disease with stage 1 through stage 4 chronic kidney disease, or unspecified chronic kidney disease: Secondary | ICD-10-CM | POA: Diagnosis not present

## 2017-01-07 DIAGNOSIS — Z794 Long term (current) use of insulin: Secondary | ICD-10-CM

## 2017-01-07 DIAGNOSIS — IMO0002 Reserved for concepts with insufficient information to code with codable children: Secondary | ICD-10-CM

## 2017-01-07 LAB — POCT GLYCOSYLATED HEMOGLOBIN (HGB A1C): Hemoglobin A1C: >14

## 2017-01-07 MED ORDER — INSULIN PEN NEEDLE 32G X 4 MM MISC: 11 refills | Status: DC

## 2017-01-07 MED ORDER — DULAGLUTIDE 0.75 MG/0.5ML ~~LOC~~ SOAJ: 0.7500 mg | SUBCUTANEOUS | 2 refills | Status: DC

## 2017-01-07 MED ORDER — METFORMIN HCL 1000 MG PO TABS: 1000.0000 mg | ORAL_TABLET | Freq: Two times a day (BID) | ORAL | 3 refills | Status: DC

## 2017-01-07 MED ORDER — INSULIN ASPART 100 UNIT/ML FLEXPEN: 10.0000 [IU] | PEN_INJECTOR | Freq: Three times a day (TID) | SUBCUTANEOUS | 3 refills | Status: DC

## 2017-01-07 MED ORDER — INSULIN DETEMIR 100 UNIT/ML FLEXPEN: PEN_INJECTOR | SUBCUTANEOUS | 3 refills | Status: DC

## 2017-01-07 NOTE — Progress Notes (Signed)
Subjective:    Patient ID: Heather Lopez, female    DOB: 1946/09/14, 71 y.o.   MRN: [id number]  Heather Lopez is a 71 y.o. female presenting on 01/07/2017 for Diabetes (highest BS 238 and lowest 121)   HPI  CHRONIC DM, Type 2: - Reports concern today with elevated A1c, today >14 previously 10.9 CBGs: Does not have CBG log today Meds: Levemir 50u AM and 44u PM, Novolog 10u TID wc. Metformin 1000mg  BID, prior history of TZD likely Actos >6+ years ago now off of this had cardiac complication Reports  good compliance. Tolerating well w/o side-effects Currently on ARB Lifestyle: Diet (Admits very poor dietary habits, describes main problem is large portion size, not limiting carbs, not aware that wheat bread is significant carb source, tries to limit sugars only) / Exercise (limited exercise due to R hip pain arthritis, awaiting future surgery) - Recently took Diabetic education nutrition class Denies hypoglycemia, polyuria, visual changes, numbness or tingling.  MORBID OBESITY BMI >41 / Weight Management / Severe R Hip OA/DJD - Reports concern with weight gain, and difficulty losing weight. Recently she has been significantly limited by hip arthritis limited activity, uses cane for ambulation. Followed by [facility] (Dr Mack Guise) and now she is unable to proceed with R hip surgery due to elevated BMI >40, was advised to get to BMI < 39 prior to performing surgery, also with uncontrolled DM. She was referred to [facility] (went to office in [place], as the [place] office is more for surgical patients), saw them once got advise on diets but does not want to follow any of these particular options, she would like to see bariatric or weight management evaluation locally instead of going to [place] HTN with CKD-III Reports usually has elevated BP in office especially if in pain now with R hip, not checking regularly BP at home but has cuff Current Meds - Amlodipine 5mg , Metoprolol  25mg  BID, Losartan 50   Reports good compliance, took meds today. Tolerating well, w/o complaints. Denies CP, dyspnea, HA, edema, dizziness / lightheadedness    Social History  Substance Use Topics  . Smoking status: Former Smoker    Packs/day: 0.75    Years: 6.00    Types: Cigarettes  . Smokeless tobacco: Former Systems developer     Comment: quit 1973  . Alcohol use No    Review of Systems Per HPI unless specifically indicated above     Objective:    BP (!) 154/76 (BP Location: Left Arm, Cuff Size: Normal)   Pulse 76   Temp 98.9 F (37.2 C) (Oral)   Resp 16   Ht 5\' 4"  (1.626 m)   Wt 242 lb 12.8 oz (110.1 kg)   BMI 41.68 kg/m   Wt Readings from Last 3 Encounters:  01/07/17 242 lb 12.8 oz (110.1 kg)  08/21/16 247 lb 9.6 oz (112.3 kg)  08/19/15 227 lb (103 kg)    Physical Exam  Constitutional: She appears well-developed and well-nourished. No distress.  Well-appearing, mild discomfort with R hip pain, cooperative, obese  HENT:  Head: Normocephalic and atraumatic.  Mouth/Throat: Oropharynx is clear and moist.  Cardiovascular: Normal rate, regular rhythm, normal heart sounds and intact distal pulses.   No murmur heard. Pulmonary/Chest: Effort normal and breath sounds normal. No respiratory distress. She has no wheezes. She has no rales.  Musculoskeletal: She exhibits no edema.  Neurological: She is alert.  Skin: Skin is warm and dry. No rash noted.  She is not diaphoretic.  Psychiatric: She has a normal mood and affect. Her behavior is normal.  Nursing note and vitals reviewed.  Results for orders placed or performed in visit on 01/07/17  POCT HgB A1C  Result Value Ref Range   Hemoglobin A1C >14       Assessment & Plan:   Problem List Items Addressed This Visit    Uncontrolled type 2 diabetes mellitus with insulin therapy ([place]) - Primary    Dramatic worsening A1c >14% from prior 10.9%, attributed mostly to very poor diet, not following DM diet and limited exercise with  chronic worsening R hip pain DJD, anticipating future surgery per Ortho if can have some weight loss. - Complicated by DM nephropathy CKD-III, and HTN  Plan: 1. Start new GLP1 today, discussed class benefits, side effects, initially unsure rx preference for Medicare, started with Trulicity however, this was not preferred option would require PA. Awaiting patient to contact Medicare for preferred option either Victoza or Bydureon, then will start new rx, also indication for weight loss in setting of uncontrolled DM2 goal for wt loss to pursue possible R Hip surgery 2. Continue Levemir 50u AM and 44u PM, and Novolog 10u TID WC, refilled today with pen needles 3. Continue Metformin 1000mg  BID, refilled 4. Emphasis on need dramatic improvement of DM diet in order to manage her diabetes, otherwise worsening complications, she is not limiting carbs, but understands what to do, she has seen nutritionist, handout given 5. Future Bariatric weight management follow-up 6. Follow-up 3 months for DM A1c - if A1c not improving or not significant enough improvement after onset of GLP1, then patient most likely will need Endocrinology, given very poor control      Relevant Medications   Insulin Pen Needle 32G X 4 MM MISC   Insulin Detemir (LEVEMIR FLEXTOUCH) 100 UNIT/ML Pen   insulin aspart (NOVOLOG FLEXPEN) 100 UNIT/ML FlexPen   metFORMIN (GLUCOPHAGE) 1000 MG tablet   Dulaglutide (TRULICITY) 8.31 DV/7.6HY SOPN   Other Relevant Orders   POCT HgB A1C (Completed)   BMI 40.0-44.9, adult (HCC)    Weight gain with BMI >41, with goal for weight loss < BMI 39 for proceeding with R hip arthroplasty per Orthopedics, which would eventually allow her to improve her regular exercise and ambulation. - Primary etiology with DM2 and poor diet  Plan: 1. Follow-up as planned with [facility], otherwise if decide to switch offices, notify us of new preferred local bariatric weight management clinic, and  request referral if needed, the local option that I advised her of apparently only evaluates surgical patients in [place] 2. Reviewed DM diet, handout given, she has seen nutrition, declined [place] referral 3. Follow-up      Relevant Medications   Insulin Detemir (LEVEMIR FLEXTOUCH) 100 UNIT/ML Pen   insulin aspart (NOVOLOG FLEXPEN) 100 UNIT/ML FlexPen   metFORMIN (GLUCOPHAGE) 1000 MG tablet   Dulaglutide (TRULICITY) 0.73 XT/0.6YI SOPN   Benign hypertension with CKD (chronic kidney disease) stage III    Elevated BP today, improved but still elevated SBP >150 on manual re-check, likely due to R hip pain based on report - Complication with CKD-III  Plan: 1. Continue current regimen - Amlodipine 5mg , Metoprolol 25mg  BID, Losartan 50mg  2. Start checking BP outside office, record readings 3. Follow-up sooner if elevated significantly otherwise follow-up as planned 3 months for HTN re-check, next step would increase Amlodipine from 5 to 10mg , or Losartan 50 to 100mg   Meds ordered this encounter  Medications  . celecoxib (CELEBREX) 200 MG capsule  . Insulin Pen Needle 32G X 4 MM MISC    Sig: Inject insulin pens up to 5 times daily    Dispense:  100 each    Refill:  11  . Insulin Detemir (LEVEMIR FLEXTOUCH) 100 UNIT/ML Pen    Sig: INJECT 50 UNITS IN THE MORNING AND 44 UNITS IN THE EVENING    Dispense:  45 mL    Refill:  3  . insulin aspart (NOVOLOG FLEXPEN) 100 UNIT/ML FlexPen    Sig: Inject 10 Units into the skin 3 (three) times daily with meals.    Dispense:  45 mL    Refill:  3  . metFORMIN (GLUCOPHAGE) 1000 MG tablet    Sig: Take 1 tablet (1,000 mg total) by mouth 2 (two) times daily.    Dispense:  180 tablet    Refill:  3  . Dulaglutide (TRULICITY) 8.37 GB/0.2XJ SOPN    Sig: Inject 0.75 mg into the skin once a week. If tolerating after 1 month, notify doctors office for increase dose 1.5mg  weekly    Dispense:  4 pen    Refill:  2      Follow up  plan: Return in about 3 months (around 04/06/2017) for diabetes, blood pressure.  Nobie Putnam, [facility] 01/07/2017, 5:51 PM

## 2017-01-07 NOTE — Assessment & Plan Note (Addendum)
Elevated BP today, improved but still elevated SBP >150 on manual re-check, likely due to R hip pain based on report - Complication with CKD-III  Plan: 1. Continue current regimen - Amlodipine 5mg , Metoprolol 25mg  BID, Losartan 50mg  2. Start checking BP outside office, record readings 3. Follow-up sooner if elevated significantly otherwise follow-up as planned 3 months for HTN re-check, next step would increase Amlodipine from 5 to 10mg , or Losartan 50 to 100mg 

## 2017-01-07 NOTE — Assessment & Plan Note (Signed)
Weight gain with BMI >41, with goal for weight loss < BMI 39 for proceeding with R hip arthroplasty per Orthopedics, which would eventually allow her to improve her regular exercise and ambulation. - Primary etiology with DM2 and poor diet  Plan: 1. Follow-up as planned with [facility], otherwise if decide to switch offices, notify us of new preferred local bariatric weight management clinic, and request referral if needed, the local option that I advised her of apparently only evaluates surgical patients in [place] 2. Reviewed DM diet, handout given, she has seen nutrition, declined [place] referral 3. Follow-up

## 2017-01-07 NOTE — Assessment & Plan Note (Addendum)
Dramatic worsening A1c >14% from prior 10.9%, attributed mostly to very poor diet, not following DM diet and limited exercise with chronic worsening R hip pain DJD, anticipating future surgery per Ortho if can have some weight loss. - Complicated by DM nephropathy CKD-III, and HTN  Plan: 1. Start new GLP1 today, discussed class benefits, side effects, initially unsure rx preference for Medicare, started with Trulicity however, this was not preferred option would require PA. Awaiting patient to contact Medicare for preferred option either Victoza or Bydureon, then will start new rx, also indication for weight loss in setting of uncontrolled DM2 goal for wt loss to pursue possible R Hip surgery 2. Continue Levemir 50u AM and 44u PM, and Novolog 10u TID WC, refilled today with pen needles 3. Continue Metformin 1000mg  BID, refilled 4. Emphasis on need dramatic improvement of DM diet in order to manage her diabetes, otherwise worsening complications, she is not limiting carbs, but understands what to do, she has seen nutritionist, handout given 5. Future Bariatric weight management follow-up 6. Follow-up 3 months for DM A1c - if A1c not improving or not significant enough improvement after onset of GLP1, then patient most likely will need Endocrinology, given very poor control

## 2017-01-07 NOTE — Patient Instructions (Signed)
Thank you for coming in to clinic today.  1. Refilled insulin and pen needles, metformin, continue same dose of all of these - Start new injectable GLP1, Dulaglutide (Trulicity) 0.75mg  WEEKLY injection, you may get some nausea initially, take with food, this should get better in future. If tolerating for 2-4 weeks, let us know and we can increase dose to 1.5mg  max weekly - Should get weight loss, better A1c control on this medication - Also be aware can get local redness irritation reaction and "little knots" as well  2. For Bariatric Weight Loss Program - find your desired clinic locally, and let us know if you need a referral, please provide name of doctor and office, and location contact #  3. Blood pressure mildly elevated, check at home 1-2x week, write down readings, notify office if persistently >160/100 - Next step would be increase Amlodipine from 5 to 10, OR could do Losartan 50 to 100  Eat at least 3 meals and 1-2 snacks per day (don't skip breakfast).  Aim for no more than 5 hours between eating. - Tip: If you go >5 hours without eating and become very hungry, your body will supply it's own resources temporarily and you can gain extra weight when you eat.  Diet Recommendations for Diabetes   Reduce Starchy (carb) foods include: Bread, rice, pasta, potatoes, corn, crackers, bagels, muffins, all baked goods.   Increase Protein foods include: Meat, fish, poultry, eggs, dairy foods, and beans such as pinto and kidney beans (beans also provide carbohydrate).   1. Eat at least 3 meals and 1-2 snacks per day. Never go more than 4-5 hours while awake without eating.   2. Limit starchy foods to TWO per meal and ONE per snack. ONE portion of a starchy  food is equal to the following:   - ONE slice of bread (or its equivalent, such as half of a hamburger bun).   - 1/2 cup of a "scoopable" starchy food such as potatoes or rice.   - 1 OUNCE (28 grams) of starchy snacks (crackers or pretzels,  look on label).   - 15 grams of carbohydrate as shown on food label.   3. Both lunch and dinner should include a protein food, a carb food, and vegetables.   - Obtain twice as many veg's as protein or carbohydrate foods for both lunch and dinner.   - Try to keep frozen veg's on hand for a quick vegetable serving.     - Fresh or frozen veg's are best.   4. Breakfast should always include protein.    Wheat bread products are almost identical to white bread for your purposes as diabetic right now   Please schedule a follow-up appointment with Dr. Parks Ranger or new NP Ander Purpura in 3 months for Diabetes A1c  If you have any other questions or concerns, please feel free to call the clinic or send a message through [place]. You may also schedule an earlier appointment if necessary.  Heather Putnam, DO [place]

## 2017-01-18 ENCOUNTER — Other Ambulatory Visit: Payer: Self-pay | Admitting: Family Medicine

## 2017-01-18 DIAGNOSIS — Z794 Long term (current) use of insulin: Principal | ICD-10-CM

## 2017-01-18 DIAGNOSIS — E1165 Type 2 diabetes mellitus with hyperglycemia: Secondary | ICD-10-CM

## 2017-01-18 DIAGNOSIS — IMO0002 Reserved for concepts with insufficient information to code with codable children: Secondary | ICD-10-CM

## 2017-01-18 MED ORDER — EXENATIDE ER 2 MG ~~LOC~~ PEN: 2.0000 mg | PEN_INJECTOR | SUBCUTANEOUS | 3 refills | Status: DC

## 2017-01-18 NOTE — Progress Notes (Signed)
Received fax from [facility] / [facility] prior Dulaglutide (Trulicity) was not preferred covered option. Requested that patient try Bydureon first, sent new rx to [facility] today 01/18/17 for Bydureon 2mg  ER weekly SQ inj #12 pens (90 day supply, 3 month supply) +3 refills.  Nobie Putnam, [facility] 01/18/2017, 5:20 PM

## 2017-04-08 ENCOUNTER — Telehealth: Payer: Self-pay | Admitting: Family Medicine

## 2017-04-08 NOTE — Telephone Encounter (Signed)
Pt. Called requesting a refill on atorvastatin,  [facility]rolol [facility]

## 2017-04-10 ENCOUNTER — Ambulatory Visit: Payer: Medicare Other | Admitting: Family Medicine

## 2017-04-16 ENCOUNTER — Telehealth: Payer: Self-pay | Admitting: Family Medicine

## 2017-04-16 DIAGNOSIS — I129 Hypertensive chronic kidney disease with stage 1 through stage 4 chronic kidney disease, or unspecified chronic kidney disease: Secondary | ICD-10-CM

## 2017-04-16 DIAGNOSIS — E785 Hyperlipidemia, unspecified: Secondary | ICD-10-CM

## 2017-04-16 DIAGNOSIS — N183 Chronic kidney disease, stage 3 unspecified: Secondary | ICD-10-CM

## 2017-04-16 DIAGNOSIS — F32A Depression, unspecified: Secondary | ICD-10-CM

## 2017-04-16 DIAGNOSIS — E1169 Type 2 diabetes mellitus with other specified complication: Secondary | ICD-10-CM

## 2017-04-16 DIAGNOSIS — F329 Major depressive disorder, single episode, unspecified: Secondary | ICD-10-CM

## 2017-04-16 MED ORDER — METOPROLOL TARTRATE 25 MG PO TABS: 25.0000 mg | ORAL_TABLET | Freq: Two times a day (BID) | ORAL | 3 refills | Status: DC

## 2017-04-16 MED ORDER — LOSARTAN POTASSIUM 50 MG PO TABS: 50.0000 mg | ORAL_TABLET | Freq: Every day | ORAL | 3 refills | Status: DC

## 2017-04-16 MED ORDER — ATORVASTATIN CALCIUM 20 MG PO TABS: 20.0000 mg | ORAL_TABLET | Freq: Every day | ORAL | 3 refills | Status: DC

## 2017-04-16 MED ORDER — BUPROPION HCL ER (XL) 150 MG PO TB24: 150.0000 mg | ORAL_TABLET | Freq: Every day | ORAL | 3 refills | Status: DC

## 2017-04-16 NOTE — Telephone Encounter (Signed)
Pt has appt on June 12th but needs refill on dupropion, metroprolol, atorvastin and losartan now.  She uses [facility].  Her call back number is [phone]

## 2017-04-16 NOTE — Telephone Encounter (Signed)
Refilled Buproprion, Metoprolol, Atorvastatin, Losartan to Mail order [facility] #90 day supply +3 refills  Nobie Putnam, DO [facility] 04/16/2017, 12:17 PM

## 2017-05-07 ENCOUNTER — Ambulatory Visit: Payer: Medicare Other | Admitting: Family Medicine

## 2017-06-04 DIAGNOSIS — M76821 Posterior tibial tendinitis, right leg: Secondary | ICD-10-CM | POA: Diagnosis not present

## 2017-06-04 DIAGNOSIS — Z6841 Body Mass Index (BMI) 40.0 and over, adult: Secondary | ICD-10-CM | POA: Diagnosis not present

## 2017-06-04 DIAGNOSIS — M79671 Pain in right foot: Secondary | ICD-10-CM | POA: Diagnosis not present

## 2017-11-26 HISTORY — PX: CATARACT EXTRACTION, BILATERAL: SHX1313

## 2017-12-10 ENCOUNTER — Ambulatory Visit: Payer: Medicare Other

## 2017-12-10 ENCOUNTER — Ambulatory Visit: Payer: Medicare Other | Admitting: Nurse Practitioner

## 2017-12-17 ENCOUNTER — Ambulatory Visit (INDEPENDENT_AMBULATORY_CARE_PROVIDER_SITE_OTHER): Payer: Medicare Other

## 2017-12-17 ENCOUNTER — Other Ambulatory Visit: Payer: Self-pay

## 2017-12-17 ENCOUNTER — Encounter: Payer: Self-pay | Admitting: Nurse Practitioner

## 2017-12-17 ENCOUNTER — Ambulatory Visit (INDEPENDENT_AMBULATORY_CARE_PROVIDER_SITE_OTHER): Payer: Medicare Other | Admitting: Nurse Practitioner

## 2017-12-17 VITALS — BP 153/64 | HR 83 | Temp 98.2°F | Ht 64.0 in | Wt 232.0 lb

## 2017-12-17 VITALS — BP 153/64 | HR 83 | Temp 98.2°F | Resp 16 | Ht 64.0 in | Wt 232.0 lb

## 2017-12-17 DIAGNOSIS — N183 Chronic kidney disease, stage 3 unspecified: Secondary | ICD-10-CM

## 2017-12-17 DIAGNOSIS — B353 Tinea pedis: Secondary | ICD-10-CM | POA: Diagnosis not present

## 2017-12-17 DIAGNOSIS — Z794 Long term (current) use of insulin: Secondary | ICD-10-CM | POA: Diagnosis not present

## 2017-12-17 DIAGNOSIS — I129 Hypertensive chronic kidney disease with stage 1 through stage 4 chronic kidney disease, or unspecified chronic kidney disease: Secondary | ICD-10-CM

## 2017-12-17 DIAGNOSIS — Z1159 Encounter for screening for other viral diseases: Secondary | ICD-10-CM | POA: Diagnosis not present

## 2017-12-17 DIAGNOSIS — Z1239 Encounter for other screening for malignant neoplasm of breast: Secondary | ICD-10-CM

## 2017-12-17 DIAGNOSIS — Z0000 Encounter for general adult medical examination without abnormal findings: Secondary | ICD-10-CM | POA: Diagnosis not present

## 2017-12-17 DIAGNOSIS — Z1231 Encounter for screening mammogram for malignant neoplasm of breast: Secondary | ICD-10-CM | POA: Diagnosis not present

## 2017-12-17 DIAGNOSIS — Z23 Encounter for immunization: Secondary | ICD-10-CM | POA: Diagnosis not present

## 2017-12-17 DIAGNOSIS — E1165 Type 2 diabetes mellitus with hyperglycemia: Secondary | ICD-10-CM | POA: Diagnosis not present

## 2017-12-17 DIAGNOSIS — IMO0002 Reserved for concepts with insufficient information to code with codable children: Secondary | ICD-10-CM

## 2017-12-17 DIAGNOSIS — R300 Dysuria: Secondary | ICD-10-CM | POA: Diagnosis not present

## 2017-12-17 LAB — POCT GLYCOSYLATED HEMOGLOBIN (HGB A1C): Hemoglobin A1C: 11.5

## 2017-12-17 LAB — POCT URINALYSIS DIPSTICK
Bilirubin, UA: NEGATIVE
Glucose, UA: NEGATIVE
Ketones, UA: NEGATIVE
Nitrite, UA: NEGATIVE
Protein, UA: 300
Spec Grav, UA: 1.025 (ref 1.010–1.025)
Urobilinogen, UA: 0.2 E.U./dL
pH, UA: 5 (ref 5.0–8.0)

## 2017-12-17 MED ORDER — AMLODIPINE BESYLATE 10 MG PO TABS: 10.0000 mg | ORAL_TABLET | Freq: Every day | ORAL | 5 refills | Status: DC

## 2017-12-17 MED ORDER — CEPHALEXIN 500 MG PO CAPS: 500.0000 mg | ORAL_CAPSULE | Freq: Two times a day (BID) | ORAL | 0 refills | Status: AC

## 2017-12-17 MED ORDER — CLOTRIMAZOLE 1 % EX CREA: 1.0000 "application " | TOPICAL_CREAM | Freq: Two times a day (BID) | CUTANEOUS | 0 refills | Status: DC

## 2017-12-17 NOTE — Progress Notes (Signed)
Subjective:    Patient ID: Heather Lopez, female    DOB: 1946-08-15, 72 y.o.   MRN: [id number]  Heather Lopez is a 72 y.o. female presenting on 12/17/2017 for Diabetes   HPI Diabetes Pt presents today for follow up of Type 2 diabetes mellitus.  She is checking fasting am CBG at home with a range of 95-110; highs 198 and higher.  She is not checking premeal glucose levels - Current diabetic medications include: metformin and bydureon pen.  Pt was previously prescribed novolog, but pt reports taking novolog only intermittently and will go "days" without taking. - She is not currently symptomatic. However, reports increased fungal skin infections/rash under breasts and flaky white substance on feet. - She denies polydipsia, polyphagia, polyuria, headaches, diaphoresis, shakiness, chills, pain, numbness or tingling in extremities and changes in vision.   - Clinical course has been unchanged. - She  reports no regular exercise routine. - Her diet is moderate in salt, moderate in fat, and moderate in carbohydrates.  Husband is an Firefighter of food/sweets. - Weight trend: stable  PREVENTION: Eye exam current (within one year): no Foot exam current (within one year): no  Lipid/ASCVD risk reduction - on statin: on atorvastatin Kidney protection - on ace or arb: losartan Recent Labs    12/17/17 1410  HGBA1C 11.5   Hypertension - She is not checking BP at home or outside of clinic.    - Current medications: losartan, metoprolol, tolerating well without side effects - She is not currently symptomatic.  Will occasionally have migraine headache with higher BP readings. - Pt denies headache, lightheadedness, dizziness, changes in vision, chest tightness/pressure, palpitations, leg swelling, sudden loss of speech or loss of consciousness.  Dysuria  Pt notes burning at end of urination and urinary frequency.  Duration of symptoms is > 7 days. No recent treatment for UTI, but pt has frequent urination  occasionally without dysuria 2/2 T2DM.  Social History   Tobacco Use  . Smoking status: Former Smoker    Packs/day: 0.75    Years: 6.00    Pack years: 4.50    Types: Cigarettes  . Smokeless tobacco: Former Systems developer  . Tobacco comment: quit 1973  Substance Use Topics  . Alcohol use: No    Alcohol/week: 0.0 oz  . Drug use: No    Review of Systems Per HPI unless specifically indicated above     Objective:    BP (!) 153/64 (BP Location: Right Arm, Patient Position: Sitting, Cuff Size: Large)   Pulse 83   Temp 98.2 F (36.8 C) (Oral)   Ht 5\' 4"  (1.626 m)   Wt 232 lb (105.2 kg)   BMI 39.82 kg/m   Wt Readings from Last 3 Encounters:  12/17/17 232 lb (105.2 kg)  12/17/17 232 lb (105.2 kg)  01/07/17 242 lb 12.8 oz (110.1 kg)    Physical Exam  General - obese, well-appearing, NAD HEENT - Normocephalic, atraumatic, PERRL, EOMI, patent nares w/o congestion, oropharynx clear, MMM Neck - supple, non-tender, no LAD, no thyromegaly, no carotid bruit Heart - RRR, no murmurs heard Lungs - Clear throughout all lobes, no wheezing, crackles, or rhonchi. Normal work of breathing. Abdomen - soft, NTND, no masses, no hepatosplenomegaly, active bowel sounds, no increased pelvic pressure with palpation of suprapubic area Extremeties - non-tender, no edema, cap refill < 2 seconds, peripheral pulses intact +2 bilaterally Skin - warm, dry, MSAD rash under bilateral breasts without bleeding/scabbing Neuro - awake, alert, oriented x3, normal gait  Psych - Normal mood and affect, normal behavior  Diabetic Foot Exam - Simple   Simple Foot Form Diabetic Foot exam was performed with the following findings:  Yes 12/17/2017  2:47 PM  Visual Inspection See comments:  Yes Sensation Testing Intact to touch and monofilament testing bilaterally:  Yes Pulse Check Posterior Tibialis and Dorsalis pulse intact bilaterally:  Yes Comments No deformities or ulcerations.  Skin intact, however fungal white powdery  appearance to skin is present around and between toes.    Results for orders placed or performed in visit on 12/17/17  Urine Culture  Result Value Ref Range   MICRO NUMBER: [id number]    SPECIMEN QUALITY: ADEQUATE    Sample Source NOT GIVEN    STATUS: FINAL    Result:      Multiple organisms present, each less than 10,000 CFU/mL. These organisms, commonly found on external and internal genitalia, are considered to be colonizers. No further testing performed.  TIQ-MISC  Result Value Ref Range   QUESTION/PROBLEM:     QUESTION: UC/KUC or UM/KUM not appropriate for test ordered.   POCT glycosylated hemoglobin (Hb A1C)  Result Value Ref Range   Hemoglobin A1C 11.5   POCT urinalysis dipstick  Result Value Ref Range   Color, UA dark yellow    Clarity, UA hazy    Glucose, UA negative    Bilirubin, UA negative    Ketones, UA negative    Spec Grav, UA 1.025 1.010 - 1.025   Blood, UA moderate    pH, UA 5.0 5.0 - 8.0   Protein, UA 300    Urobilinogen, UA 0.2 0.2 or 1.0 E.U./dL   Nitrite, UA negative    Leukocytes, UA Trace (A) Negative   Appearance     Odor        Assessment & Plan:   Problem List Items Addressed This Visit      Cardiovascular and Mediastinum   Benign hypertension with CKD (chronic kidney disease) stage III (HCC) Uncontrolled hypertension.  BP goal < 130/80.  Pt is not working on lifestyle modifications.  Taking medications tolerating well without side effects.  - Complications of CKD stage III  Plan: 1. Continue taking losartan and metoprolol without changes - START taking amlodipine 10 mg once daily instead of 5 mg daily 2. Obtain labs CMP, Lipid, POCT microalbumin 3. Encouraged heart healthy diet and increasing exercise to 30 minutes most days of the week. 4. Check BP 1-2 x per week at home, keep log, and bring to clinic at next appointment. 5. Follow up 3 months.    Relevant Medications   amLODipine (NORVASC) 10 MG tablet   Other Relevant Orders   POCT  urinalysis dipstick (Completed)   MM DIGITAL SCREENING BILATERAL   Comprehensive metabolic panel   Lipid panel     Endocrine   Uncontrolled type 2 diabetes mellitus with insulin therapy ([place]) - Primary UncontrolledDM with A1c > 14% and goal A1c < 8.0%.  Pt is not consistently taking novolog.  Is not continuously adhering to low glycemic diet and has frequent dietary indiscretions. - Hyperglycemia frequently.  NO known hypoglycemia. - Complications - CKD stage III, peripheral neuropathy  Plan:  1. Change therapy: more consistently take novolog.  Meal dose of 10 units per meal. - Continue Bydureon pen, levemir 50 units am/44 units pm, metformin 2. Encourage improved lifestyle: - low carb/low glycemic diet(handout provided) - Increase physical activity to 30 minutes most days of the week.  Explained that increased physical  activity increases body's use of sugar for energy. 3. Check fasting am CBG and two other premeal sugars.  Bring log to next visit for review 4. Continue ARB, Statin 5. Foot exam today - Advised to schedule DM ophtho exam, send record 6. Follow-up 4 weeks   Relevant Medications   glucose blood (ONE TOUCH ULTRA TEST) test strip   Other Relevant Orders   POCT glycosylated hemoglobin (Hb A1C) (Completed)   POCT urinalysis dipstick (Completed)   MM DIGITAL SCREENING BILATERAL   Comprehensive metabolic panel   Lipid panel    Other Visit Diagnoses    Tinea pedis of both feet     Fungal foot infection present without skin breakdown.  Plan: 1. Start using clotrimazole 2 x daily x 14 days. 2. Keep feet clean and dry. 3. Followup as needed.   Relevant Medications   clotrimazole (LOTRIMIN) 1 % cream   Breast cancer screening     Pt last mammogram > 2 years ago.  Plan: 1. Screening mammogram order placed.  Pt will call to schedule appointment.  Information given.   Relevant Orders   MM DIGITAL SCREENING BILATERAL   Dysuria     Pt with dysuria/ urinary frequency.   Likely is related to hyperglycemia, but pt is concerned for UTI.  Plan: 1. POCT urinalysis 2. Urine culture and micro for confirmation 3. Followup as needed.  Encourage improved glucose control.   Relevant Orders   POCT urinalysis dipstick (Completed)   Urine Culture (Completed)   Urinalysis, microscopic only      Meds ordered this encounter  Medications  . clotrimazole (LOTRIMIN) 1 % cream    Sig: Apply 1 application topically 2 (two) times daily.    Dispense:  30 g    Refill:  0    Order Specific Question:   Supervising Provider    Answer:   Olin Hauser [[id number]]  . amLODipine (NORVASC) 10 MG tablet    Sig: Take 1 tablet (10 mg total) by mouth daily.    Dispense:  30 tablet    Refill:  5    Order Specific Question:   Supervising Provider    Answer:   Olin Hauser [[id number]]  . cephALEXin (KEFLEX) 500 MG capsule    Sig: Take 1 capsule (500 mg total) by mouth 2 (two) times daily for 5 days.    Dispense:  10 capsule    Refill:  0    Order Specific Question:   Supervising Provider    Answer:   Olin Hauser [[id number]]  . glucose blood (ONE TOUCH ULTRA TEST) test strip    Sig: Use as instructed    Dispense:  100 each    Refill:  3    This is for 90-day supply.    Order Specific Question:   Supervising Provider    Answer:   Olin Hauser [[id number]]      Follow up plan: Return in about 4 weeks (around 01/14/2018) for diabetes and hypertension AND labs 3 days prior.  A total of 40 minutes was spent face-to-face with this patient. Greater than 50% of this time was spent in counseling and coordination of care with the patient.   Cassell Smiles, DNP, AGPCNP-BC Adult Gerontology Primary Care Nurse Practitioner [facility] 01/15/2018, 2:48 PM

## 2017-12-17 NOTE — Patient Instructions (Addendum)
Heather Lopez, Thank you for coming in to clinic today.  1. For your diabetes: - START using your novolog consistently at every meal. - Continue your bydureon bcise injection once weekly on the same day of the week. - Continue metformin 1,000 mg twice daily - Continue levemir 50 units in am and 44 units at bedtime. - Work on your low glycemic diet. - CHECK your blood sugars and keep a log.   Before meals sugars are to be before any food is eaten for that meal.  After meals sugars are to be checked 2 hours after you have finished eating.  - Continue atorvastatin and losartan for prevention of complications from diabetes.  - You can learn more information online about your diabetes at American Diabetes Association: http://www.diabetes.org/ - General self-care (diet, medications, blood sugar checks). - Diet recommendations - There are even recipes available for you to look at and try.  Fungal Skin infections - START applying your lotrimin cream twice daily to your feet and under your breasts for a fungal infection of your skin.    Keep your breasts well supported with a bra and other cloth barrier between your breasts and chest wall.  Keep this area as dry as possible. Diaper rash cream may also be of assistance to your for moisture barrier.  2. For your blood pressure: - START taking amlodipin 10 mg once daily (increase the dose). - Continue losartan and metoprolol without changes today.  3. I will call you about the results of your urine test.  4. Your mammogram order has been placed.  Call the Scheduling phone number at [phone] to schedule your mammogram at your convenience.  You can choose to go to either location listed below.  Let the scheduler know which location you prefer.  [place]  [place], [place] [address]   [facility] [address] [place], [place]  [address]   You will be due for FASTING BLOOD WORK.  This means you should eat no food or drink after midnight.  Drink only water or coffee without cream/sugar on the morning of your lab visit. - Please go ahead and schedule a "Lab Only" visit in the morning at the clinic for lab draw 3 days before your next office visit. - Your results will be available about 2-3 days after blood draw.  If you have set up a MyChart account, you can can log in to MyChart online to view your results and a brief explanation. Also, we can discuss your results together at your next office visit if you would like.   Please schedule a follow-up appointment with Cassell Smiles, AGNP. Return in about 4 weeks (around 01/14/2018) for diabetes and hypertension AND labs 3 days prior.  If you have any other questions or concerns, please feel free to call the clinic or send a message through [place]. You may also schedule an earlier appointment if necessary.  You will receive a survey after today's visit either digitally by e-mail or paper by [employer]. Your experiences and feedback matter to [place].  Please respond so we know how we are doing as we provide care for you.   Cassell Smiles, DNP, AGNP-BC Adult Gerontology Nurse Practitioner [place]

## 2017-12-17 NOTE — Progress Notes (Signed)
Subjective:   Heather Lopez is a 72 y.o. female who presents for an Initial Medicare Annual Wellness Visit.  Review of Systems     Cardiac Risk Factors include: advanced age (>33men, >86 women);diabetes mellitus;hypertension;obesity (BMI >30kg/m2);dyslipidemia     Objective:    Today's Vitals   12/17/17 1351  BP: (!) 153/64  Pulse: 83  Resp: 16  Temp: 98.2 F (36.8 C)  TempSrc: Oral  Weight: 232 lb (105.2 kg)  Height: 5\' 4"  (1.626 m)   Body mass index is 39.82 kg/m.  Advanced Directives 12/17/2017 08/19/2015  Does Patient Have a Medical Advance Directive? Yes Yes  Type of Paramedic of [place];Living will [place]  Does patient want to make changes to medical advance directive? - No - Patient declined  Copy of [place] in Chart? No - copy requested No - copy requested    Current Medications (verified) Outpatient Encounter Medications as of 12/17/2017  Medication Sig  . aspirin EC 81 MG tablet Take 81 mg by mouth daily.  [name] atorvastatin (LIPITOR) 20 MG tablet Take 1 tablet (20 mg total) by mouth daily.  [name] buPROPion (WELLBUTRIN XL) 150 MG 24 hr tablet Take 1 tablet (150 mg total) by mouth daily.  . celecoxib (CELEBREX) 200 MG capsule   . Exenatide ER 2 MG PEN Inject 2 mg into the skin once a week.  . insulin aspart (NOVOLOG FLEXPEN) 100 UNIT/ML FlexPen Inject 10 Units into the skin 3 (three) times daily with meals.  . Insulin Detemir (LEVEMIR FLEXTOUCH) 100 UNIT/ML Pen INJECT 50 UNITS IN THE MORNING AND 44 UNITS IN THE EVENING  . Insulin Pen Needle 32G X 4 MM MISC Inject insulin pens up to 5 times daily  . losartan (COZAAR) 50 MG tablet Take 1 tablet (50 mg total) by mouth daily.  . meclizine (ANTIVERT) 25 MG tablet Take 25 mg by mouth 3 (three) times daily as needed.  . metFORMIN (GLUCOPHAGE) 1000 MG tablet Take 1 tablet (1,000 mg total) by mouth 2 (two) times daily.  . metoprolol tartrate (LOPRESSOR) 25 MG  tablet Take 1 tablet (25 mg total) by mouth 2 (two) times daily.  . [DISCONTINUED] amLODipine (NORVASC) 5 MG tablet Take 5 mg by mouth daily.  [name] terbinafine (LAMISIL) 1 % cream Apply 1 application topically 2 (two) times daily. (Patient not taking: Reported on 12/17/2017)   No facility-administered encounter medications on file as of 12/17/2017.     Allergies (verified) Iodine   History: Past Medical History:  Diagnosis Date  . Diabetes mellitus without complication ([place])   . Hyperlipidemia   . Hypertension   . Vertigo    Past Surgical History:  Procedure Laterality Date  . COLONOSCOPY WITH PROPOFOL N/A 08/19/2015   Procedure: COLONOSCOPY WITH PROPOFOL;  Surgeon: Lucilla Lame, MD;  Location: [facility];  Service: Endoscopy;  Laterality: N/A;  diabetic - insulin  . POLYPECTOMY  08/19/2015   Procedure: POLYPECTOMY;  Surgeon: Lucilla Lame, MD;  Location: [place];  Service: Endoscopy;;  . TUBAL LIGATION  1992   Family History  Problem Relation Age of Onset  . Diabetes Father   . Cancer Father        lung cancer  . Diabetes Brother    Social History   Socioeconomic History  . Marital status: Married    Spouse name: None  . Number of children: None  . Years of education: None  . Highest education level: None  Social Needs  . Financial resource strain: Not hard at all  . Food insecurity - worry: Never true  . Food insecurity - inability: Never true  . Transportation needs - medical: No  . Transportation needs - non-medical: No  Occupational History  . None  Tobacco Use  . Smoking status: Former Smoker    Packs/day: 0.75    Years: 6.00    Pack years: 4.50    Types: Cigarettes  . Smokeless tobacco: Former Systems developer  . Tobacco comment: quit 1973  Substance and Sexual Activity  . Alcohol use: No    Alcohol/week: 0.0 oz  . Drug use: No  . Sexual activity: None  Other Topics Concern  . None  Social History Narrative  . None    Tobacco  Counseling Counseling given: Not Answered Comment: quit 1973   Clinical Intake:  Pre-visit preparation completed: Yes  Pain : No/denies pain     Nutritional Status: BMI > 30  Obese Nutritional Risks: None Diabetes: Yes CBG done?: Yes CBG resulted in Enter/ Edit results?: Yes Did pt. bring in CBG monitor from home?: No  How often do you need to have someone help you when you read instructions, pamphlets, or other written materials from your doctor or pharmacy?: 1 - Never What is the last grade level you completed in school?: college   Interpreter Needed?: No  Information entered by :: Shamarr Faucett,LPN    Activities of Daily Living In your present state of health, do you have any difficulty performing the following activities: 12/17/2017 01/07/2017  Hearing? N N  Vision? N N  Difficulty concentrating or making decisions? N N  Walking or climbing stairs? Y N  Comment has bone on bone in hip -  Dressing or bathing? N N  Doing errands, shopping? N N  Preparing Food and eating ? N -  Using the Toilet? N -  In the past six months, have you accidently leaked urine? N -  Do you have problems with loss of bowel control? N -  Managing your Medications? N -  Managing your Finances? N -  Housekeeping or managing your Housekeeping? N -  Some recent data might be hidden     Immunizations and Health Maintenance Immunization History  Administered Date(s) Administered  . Influenza Split 09/28/2014  . Influenza, High Dose Seasonal PF 08/21/2016, 12/17/2017  . Influenza-Unspecified 08/26/2014, 08/21/2016  . Pneumococcal Conjugate-13 07/12/2015  . Pneumococcal Polysaccharide-23 09/26/2012, 11/26/2012  . Tdap 03/26/2009   Health Maintenance Due  Topic Date Due  . Hepatitis C Screening  1946-05-25  . MAMMOGRAM  07/02/2014  . OPHTHALMOLOGY EXAM  05/21/2017  . FOOT EXAM  08/21/2017    Patient Care Team: Mikey College, NP as PCP - General (Nurse  Practitioner)  Indicate any recent Medical Services you may have received from other than [facility] providers in the past year (date may be approximate).     Assessment:   This is a routine wellness examination for Heather Lopez.  Hearing/Vision screen Vision Screening Comments: Goes [facility]  Dietary issues and exercise activities discussed: Current Exercise Habits: The patient does not participate in regular exercise at present, Exercise limited by: orthopedic condition(s)(bone on bone)  Goals    . DIET - INCREASE WATER INTAKE     Recommend drinking at least 6-8 glasses of water a day       Depression Screen PHQ 2/9 Scores 08/21/2016 07/12/2015  PHQ - 2 Score 0 1  PHQ- 9 Score -  5    Fall Risk Fall Risk  08/21/2016 07/12/2015  Falls in the past year? No No    Is the patient's home free of loose throw rugs in walkways, pet beds, electrical cords, etc?   yes      Grab bars in the bathroom? no      Handrails on the stairs?   yes      Adequate lighting?   yes  Timed Get Up and Go Performed completed in 9 seconds with no use of assistive devices. Steady gait. No intervention needed at this time.   Cognitive Function:     6CIT Screen 12/17/2017  What Year? 0 points  What month? 0 points  What time? 0 points  Count back from 20 0 points  Months in reverse 0 points  Repeat phrase 0 points  Total Score 0    Screening Tests Health Maintenance  Topic Date Due  . Hepatitis C Screening  04-21-1946  . MAMMOGRAM  07/02/2014  . OPHTHALMOLOGY EXAM  05/21/2017  . FOOT EXAM  08/21/2017  . HEMOGLOBIN A1C  06/16/2018  . TETANUS/TDAP  07/03/2019  . COLONOSCOPY  08/18/2020  . INFLUENZA VACCINE  Completed  . DEXA SCAN  Completed  . PNA vac Low Risk Adult  Completed    Qualifies for Shingles Vaccine? Dicussed shingrix vaccine  Cancer Screenings: Lung: Low Dose CT Chest recommended if Age 1-80 years, 30 pack-year currently smoking OR have quit w/in 15years. Patient does not  qualify. Breast: Up to date on Mammogram? No  , ordered - patient to call and schedule Up to date of Bone Density/Dexa? Yes Colorectal: completed 08/19/2015  Additional Screenings: Hepatitis B/HIV/Syphillis: not indicated Hepatitis C Screening: due, will order at next lab draw     Plan:    I have personally reviewed and addressed the Medicare Annual Wellness questionnaire and have noted the following in the patient's chart:  A. Medical and social history B. Use of alcohol, tobacco or illicit drugs  C. Current medications and supplements D. Functional ability and status E.  Nutritional status F.  Physical activity G. Advance directives H. List of other physicians I.  Hospitalizations, surgeries, and ER visits in previous 12 months J.  [place] such as hearing and vision if needed, cognitive and depression L. Referrals and appointments   In addition, I have reviewed and discussed with patient certain preventive protocols, quality metrics, and best practice recommendations. A written personalized care plan for preventive services as well as general preventive health recommendations were provided to patient.   Signed,  Tyler Aas, LPN Nurse Health Advisor   Nurse Notes:none

## 2017-12-17 NOTE — Patient Instructions (Addendum)
Heather Lopez , Thank you for taking time to come for your Medicare Wellness Visit. I appreciate your ongoing commitment to your health goals. Please review the following plan we discussed and let me know if I can assist you in the future.   Screening recommendations/referrals: Colonoscopy: completed 08/19/2015 Mammogram: due now Please call [phone] to schedule your mammogram.  Bone Density: completed 06/29/2003 Recommended yearly ophthalmology/optometry visit for glaucoma screening and checkup Recommended yearly dental visit for hygiene and checkup  Vaccinations: Influenza vaccine: done today  Pneumococcal vaccine: up to date Tdap vaccine: up to date  Shingles vaccine: due, check with your insurance company for coverage   Advanced directives: Please bring a copy of your health care power of attorney and living will to the office at your convenience.  Conditions/risks identified: Recommend drinking at least 6-8 glasses of water a day   Next appointment: Follow up in one year for your annual wellness exam.    Preventive Care 65 Years and Older, Female Preventive care refers to lifestyle choices and visits with your health care provider that can promote health and wellness. What does preventive care include?  A yearly physical exam. This is also called an annual well check.  Dental exams once or twice a year.  Routine eye exams. Ask your health care provider how often you should have your eyes checked.  Personal lifestyle choices, including:  Daily care of your teeth and gums.  Regular physical activity.  Eating a healthy diet.  Avoiding tobacco and drug use.  Limiting alcohol use.  Practicing safe sex.  Taking low-dose aspirin every day.  Taking vitamin and mineral supplements as recommended by your health care provider. What happens during an annual well check? The services and screenings done by your health care provider during your annual well check will depend on  your age, overall health, lifestyle risk factors, and family history of disease. Counseling  Your health care provider may ask you questions about your:  Alcohol use.  Tobacco use.  Drug use.  Emotional well-being.  Home and relationship well-being.  Sexual activity.  Eating habits.  History of falls.  Memory and ability to understand (cognition).  Work and work Statistician.  Reproductive health. Screening  You may have the following tests or measurements:  Height, weight, and BMI.  Blood pressure.  Lipid and cholesterol levels. These may be checked every 5 years, or more frequently if you are over 46 years old.  Skin check.  Lung cancer screening. You may have this screening every year starting at age 44 if you have a 30-pack-year history of smoking and currently smoke or have quit within the past 15 years.  Fecal occult blood test (FOBT) of the stool. You may have this test every year starting at age 27.  Flexible sigmoidoscopy or colonoscopy. You may have a sigmoidoscopy every 5 years or a colonoscopy every 10 years starting at age 56.  Hepatitis C blood test.  Hepatitis B blood test.  Sexually transmitted disease (STD) testing.  Diabetes screening. This is done by checking your blood sugar (glucose) after you have not eaten for a while (fasting). You may have this done every 1-3 years.  Bone density scan. This is done to screen for osteoporosis. You may have this done starting at age 80.  Mammogram. This may be done every 1-2 years. Talk to your health care provider about how often you should have regular mammograms. Talk with your health care provider about your test results, treatment options,  and if necessary, the need for more tests. Vaccines  Your health care provider may recommend certain vaccines, such as:  Influenza vaccine. This is recommended every year.  Tetanus, diphtheria, and acellular pertussis (Tdap, Td) vaccine. You may need a Td booster  every 10 years.  Zoster vaccine. You may need this after age 40.  Pneumococcal 13-valent conjugate (PCV13) vaccine. One dose is recommended after age 64.  Pneumococcal polysaccharide (PPSV23) vaccine. One dose is recommended after age 24. Talk to your health care provider about which screenings and vaccines you need and how often you need them. This information is not intended to replace advice given to you by your health care provider. Make sure you discuss any questions you have with your health care provider. Document Released: 12/09/2015 Document Revised: 08/01/2016 Document Reviewed: 09/13/2015 Elsevier Interactive Patient Education  2017 [place] Prevention in the Home Falls can cause injuries. They can happen to people of all ages. There are many things you can do to make your home safe and to help prevent falls. What can I do on the outside of my home?  Regularly fix the edges of walkways and driveways and fix any cracks.  Remove anything that might make you trip as you walk through a door, such as a raised step or threshold.  Trim any bushes or trees on the path to your home.  Use bright outdoor lighting.  Clear any walking paths of anything that might make someone trip, such as rocks or tools.  Regularly check to see if handrails are loose or broken. Make sure that both sides of any steps have handrails.  Any raised decks and porches should have guardrails on the edges.  Have any leaves, snow, or ice cleared regularly.  Use sand or salt on walking paths during winter.  Clean up any spills in your garage right away. This includes oil or grease spills. What can I do in the bathroom?  Use night lights.  Install grab bars by the toilet and in the tub and shower. Do not use towel bars as grab bars.  Use non-skid mats or decals in the tub or shower.  If you need to sit down in the shower, use a plastic, non-slip stool.  Keep the floor dry. Clean up any  water that spills on the floor as soon as it happens.  Remove soap buildup in the tub or shower regularly.  Attach bath mats securely with double-sided non-slip rug tape.  Do not have throw rugs and other things on the floor that can make you trip. What can I do in the bedroom?  Use night lights.  Make sure that you have a light by your bed that is easy to reach.  Do not use any sheets or blankets that are too big for your bed. They should not hang down onto the floor.  Have a firm chair that has side arms. You can use this for support while you get dressed.  Do not have throw rugs and other things on the floor that can make you trip. What can I do in the kitchen?  Clean up any spills right away.  Avoid walking on wet floors.  Keep items that you use a lot in easy-to-reach places.  If you need to reach something above you, use a strong step stool that has a grab bar.  Keep electrical cords out of the way.  Do not use floor polish or wax that makes floors slippery. If  you must use wax, use non-skid floor wax.  Do not have throw rugs and other things on the floor that can make you trip. What can I do with my stairs?  Do not leave any items on the stairs.  Make sure that there are handrails on both sides of the stairs and use them. Fix handrails that are broken or loose. Make sure that handrails are as long as the stairways.  Check any carpeting to make sure that it is firmly attached to the stairs. Fix any carpet that is loose or worn.  Avoid having throw rugs at the top or bottom of the stairs. If you do have throw rugs, attach them to the floor with carpet tape.  Make sure that you have a light switch at the top of the stairs and the bottom of the stairs. If you do not have them, ask someone to add them for you. What else can I do to help prevent falls?  Wear shoes that:  Do not have high heels.  Have rubber bottoms.  Are comfortable and fit you well.  Are closed  at the toe. Do not wear sandals.  If you use a stepladder:  Make sure that it is fully opened. Do not climb a closed stepladder.  Make sure that both sides of the stepladder are locked into place.  Ask someone to hold it for you, if possible.  Clearly mark and make sure that you can see:  Any grab bars or handrails.  First and last steps.  Where the edge of each step is.  Use tools that help you move around (mobility aids) if they are needed. These include:  Canes.  Walkers.  Scooters.  Crutches.  Turn on the lights when you go into a dark area. Replace any light bulbs as soon as they burn out.  Set up your furniture so you have a clear path. Avoid moving your furniture around.  If any of your floors are uneven, fix them.  If there are any pets around you, be aware of where they are.  Review your medicines with your doctor. Some medicines can make you feel dizzy. This can increase your chance of falling. Ask your doctor what other things that you can do to help prevent falls. This information is not intended to replace advice given to you by your health care provider. Make sure you discuss any questions you have with your health care provider. Document Released: 09/08/2009 Document Revised: 04/19/2016 Document Reviewed: 12/17/2014 Elsevier Interactive Patient Education  2017 [place].  Influenza (Flu) Vaccine (Inactivated or Recombinant): What You Need to Know 1. Why get vaccinated? Influenza ("flu") is a contagious disease that spreads around the [place] every year, usually between October and May. Flu is caused by influenza viruses, and is spread mainly by coughing, sneezing, and close contact. Anyone can get flu. Flu strikes suddenly and can last several days. Symptoms vary by age, but can include:  fever/chills  sore throat  muscle aches  fatigue  cough  headache  runny or stuffy nose  Flu can also lead to pneumonia and blood infections,  and cause diarrhea and seizures in children. If you have a medical condition, such as heart or lung disease, flu can make it worse. Flu is more dangerous for some people. Infants and young children, people 4 years of age and older, pregnant women, and people with certain health conditions or a weakened immune system are at greatest risk. Each year thousands  of people in the [place] die from flu, and many more are hospitalized. Flu vaccine can:  keep you from getting flu,  make flu less severe if you do get it, and  keep you from spreading flu to your family and other people. 2. Inactivated and recombinant flu vaccines A dose of flu vaccine is recommended every flu season. Children 6 months through 59 years of age may need two doses during the same flu season. Everyone else needs only one dose each flu season. Some inactivated flu vaccines contain a very small amount of a mercury-based preservative called thimerosal. Studies have not shown thimerosal in vaccines to be harmful, but flu vaccines that do not contain thimerosal are available. There is no live flu virus in flu shots. They cannot cause the flu. There are many flu viruses, and they are always changing. Each year a new flu vaccine is made to protect against three or four viruses that are likely to cause disease in the upcoming flu season. But even when the vaccine doesn't exactly match these viruses, it may still provide some protection. Flu vaccine cannot prevent:  flu that is caused by a virus not covered by the vaccine, or  illnesses that look like flu but are not.  It takes about 2 weeks for protection to develop after vaccination, and protection lasts through the flu season. 3. Some people should not get this vaccine Tell the person who is giving you the vaccine:  If you have any severe, life-threatening allergies. If you ever had a life-threatening allergic reaction after a dose of flu vaccine, or have a severe allergy to  any part of this vaccine, you may be advised not to get vaccinated. Most, but not all, types of flu vaccine contain a small amount of egg protein.  If you ever had Guillain-Barr Syndrome (also called GBS). Some people with a history of GBS should not get this vaccine. This should be discussed with your doctor.  If you are not feeling well. It is usually okay to get flu vaccine when you have a mild illness, but you might be asked to come back when you feel better.  4. Risks of a vaccine reaction With any medicine, including vaccines, there is a chance of reactions. These are usually mild and go away on their own, but serious reactions are also possible. Most people who get a flu shot do not have any problems with it. Minor problems following a flu shot include:  soreness, redness, or swelling where the shot was given  hoarseness  sore, red or itchy eyes  cough  fever  aches  headache  itching  fatigue  If these problems occur, they usually begin soon after the shot and last 1 or 2 days. More serious problems following a flu shot can include the following:  There may be a small increased risk of Guillain-Barre Syndrome (GBS) after inactivated flu vaccine. This risk has been estimated at 1 or 2 additional cases per million people vaccinated. This is much lower than the risk of severe complications from flu, which can be prevented by flu vaccine.  Young children who get the flu shot along with pneumococcal vaccine (PCV13) and/or DTaP vaccine at the same time might be slightly more likely to have a seizure caused by fever. Ask your doctor for more information. Tell your doctor if a child who is getting flu vaccine has ever had a seizure.  Problems that could happen after any injected vaccine:  People  sometimes faint after a medical procedure, including vaccination. Sitting or lying down for about 15 minutes can help prevent fainting, and injuries caused by a fall. Tell your doctor  if you feel dizzy, or have vision changes or ringing in the ears.  Some people get severe pain in the shoulder and have difficulty moving the arm where a shot was given. This happens very rarely.  Any medication can cause a severe allergic reaction. Such reactions from a vaccine are very rare, estimated at about 1 in a million doses, and would happen within a few minutes to a few hours after the vaccination. As with any medicine, there is a very remote chance of a vaccine causing a serious injury or death. The safety of vaccines is always being monitored. For more information, visit: http://www.aguilar.org/ 5. What if there is a serious reaction? What should I look for? Look for anything that concerns you, such as signs of a severe allergic reaction, very high fever, or unusual behavior. Signs of a severe allergic reaction can include hives, swelling of the face and throat, difficulty breathing, a fast heartbeat, dizziness, and weakness. These would start a few minutes to a few hours after the vaccination. What should I do?  If you think it is a severe allergic reaction or other emergency that can't wait, call 9-1-1 and get the person to the nearest hospital. Otherwise, call your doctor.  Reactions should be reported to the Vaccine Adverse Event Reporting System (VAERS). Your doctor should file this report, or you can do it yourself through the VAERS web site at www.vaers.SamedayNews.es, or by calling 984-512-9512. ? VAERS does not give medical advice. 6. The National Vaccine Injury Compensation Program The [employer] Vaccine Injury Compensation Program (VICP) is a federal program that was created to compensate people who may have been injured by certain vaccines. Persons who believe they may have been injured by a vaccine can learn about the program and about filing a claim by calling (801)466-7492 or visiting the [place] website at GoldCloset.com.ee. There is a time limit to file a  claim for compensation. 7. How can I learn more?  Ask your healthcare provider. He or she can give you the vaccine package insert or suggest other sources of information.  Call your local or state health department.  Contact the Centers for Disease Control and Prevention (CDC): ? Call 260 266 8616 (1-800-CDC-INFO) or ? Visit CDC's website at https://gibson.com/ Vaccine Information Statement, Inactivated Influenza Vaccine (07/02/2014) This information is not intended to replace advice given to you by your health care provider. Make sure you discuss any questions you have with your health care provider. Document Released: 09/06/2006 Document Revised: 08/02/2016 Document Reviewed: 08/02/2016 Elsevier Interactive Patient Education  2017 [employer].

## 2017-12-18 LAB — TIQ-MISC

## 2017-12-20 ENCOUNTER — Other Ambulatory Visit: Payer: Self-pay

## 2017-12-20 LAB — URINE CULTURE
MICRO NUMBER:: 90090617
SPECIMEN QUALITY:: ADEQUATE

## 2017-12-24 ENCOUNTER — Other Ambulatory Visit: Payer: Self-pay | Admitting: Family Medicine

## 2017-12-24 DIAGNOSIS — Z794 Long term (current) use of insulin: Principal | ICD-10-CM

## 2017-12-24 DIAGNOSIS — E1165 Type 2 diabetes mellitus with hyperglycemia: Secondary | ICD-10-CM

## 2017-12-24 DIAGNOSIS — IMO0002 Reserved for concepts with insufficient information to code with codable children: Secondary | ICD-10-CM

## 2018-01-03 ENCOUNTER — Other Ambulatory Visit: Payer: Self-pay | Admitting: Family Medicine

## 2018-01-03 DIAGNOSIS — Z794 Long term (current) use of insulin: Principal | ICD-10-CM

## 2018-01-03 DIAGNOSIS — IMO0002 Reserved for concepts with insufficient information to code with codable children: Secondary | ICD-10-CM

## 2018-01-03 DIAGNOSIS — E1165 Type 2 diabetes mellitus with hyperglycemia: Secondary | ICD-10-CM

## 2018-01-13 MED ORDER — GLUCOSE BLOOD VI STRP: ORAL_STRIP | 3 refills | Status: DC

## 2018-01-14 ENCOUNTER — Other Ambulatory Visit: Payer: Medicare Other

## 2018-01-15 ENCOUNTER — Encounter: Payer: Self-pay | Admitting: Nurse Practitioner

## 2018-01-17 ENCOUNTER — Ambulatory Visit: Payer: Medicare Other | Admitting: Nurse Practitioner

## 2018-02-10 ENCOUNTER — Other Ambulatory Visit: Payer: Medicare Other

## 2018-02-13 ENCOUNTER — Ambulatory Visit: Payer: Medicare Other | Admitting: Nurse Practitioner

## 2018-02-13 ENCOUNTER — Other Ambulatory Visit: Payer: Self-pay

## 2018-02-13 DIAGNOSIS — IMO0002 Reserved for concepts with insufficient information to code with codable children: Secondary | ICD-10-CM

## 2018-02-13 DIAGNOSIS — Z794 Long term (current) use of insulin: Principal | ICD-10-CM

## 2018-02-13 DIAGNOSIS — E1165 Type 2 diabetes mellitus with hyperglycemia: Secondary | ICD-10-CM

## 2018-02-13 MED ORDER — FREESTYLE LITE DEVI: 1.0000 | Freq: Two times a day (BID) | 0 refills | Status: DC

## 2018-02-13 MED ORDER — GLUCOSE BLOOD VI STRP: ORAL_STRIP | 12 refills | Status: DC

## 2018-02-13 MED ORDER — FREESTYLE LANCETS MISC: 12 refills | Status: DC

## 2018-02-14 DIAGNOSIS — E083293 Diabetes mellitus due to underlying condition with mild nonproliferative diabetic retinopathy without macular edema, bilateral: Secondary | ICD-10-CM | POA: Diagnosis not present

## 2018-02-14 DIAGNOSIS — H524 Presbyopia: Secondary | ICD-10-CM | POA: Diagnosis not present

## 2018-02-14 DIAGNOSIS — E113293 Type 2 diabetes mellitus with mild nonproliferative diabetic retinopathy without macular edema, bilateral: Secondary | ICD-10-CM | POA: Diagnosis not present

## 2018-02-14 DIAGNOSIS — H43813 Vitreous degeneration, bilateral: Secondary | ICD-10-CM | POA: Diagnosis not present

## 2018-02-14 DIAGNOSIS — H25043 Posterior subcapsular polar age-related cataract, bilateral: Secondary | ICD-10-CM | POA: Diagnosis not present

## 2018-02-21 LAB — HM DIABETES EYE EXAM

## 2018-02-28 DIAGNOSIS — H2513 Age-related nuclear cataract, bilateral: Secondary | ICD-10-CM | POA: Diagnosis not present

## 2018-03-03 ENCOUNTER — Encounter: Payer: Self-pay | Admitting: Family Medicine

## 2018-03-03 DIAGNOSIS — E119 Type 2 diabetes mellitus without complications: Secondary | ICD-10-CM | POA: Insufficient documentation

## 2018-03-03 DIAGNOSIS — E1122 Type 2 diabetes mellitus with diabetic chronic kidney disease: Secondary | ICD-10-CM | POA: Insufficient documentation

## 2018-03-03 DIAGNOSIS — N184 Chronic kidney disease, stage 4 (severe): Secondary | ICD-10-CM | POA: Insufficient documentation

## 2018-03-03 DIAGNOSIS — E113293 Type 2 diabetes mellitus with mild nonproliferative diabetic retinopathy without macular edema, bilateral: Secondary | ICD-10-CM

## 2018-03-03 DIAGNOSIS — Z794 Long term (current) use of insulin: Secondary | ICD-10-CM | POA: Insufficient documentation

## 2018-03-14 ENCOUNTER — Other Ambulatory Visit: Payer: Self-pay

## 2018-03-14 DIAGNOSIS — Z1159 Encounter for screening for other viral diseases: Secondary | ICD-10-CM

## 2018-03-17 ENCOUNTER — Other Ambulatory Visit: Payer: Medicare Other

## 2018-03-17 DIAGNOSIS — M79675 Pain in left toe(s): Secondary | ICD-10-CM | POA: Diagnosis not present

## 2018-03-17 DIAGNOSIS — Z1159 Encounter for screening for other viral diseases: Secondary | ICD-10-CM | POA: Diagnosis not present

## 2018-03-17 DIAGNOSIS — N183 Chronic kidney disease, stage 3 (moderate): Secondary | ICD-10-CM | POA: Diagnosis not present

## 2018-03-17 DIAGNOSIS — E1165 Type 2 diabetes mellitus with hyperglycemia: Secondary | ICD-10-CM | POA: Diagnosis not present

## 2018-03-17 DIAGNOSIS — I129 Hypertensive chronic kidney disease with stage 1 through stage 4 chronic kidney disease, or unspecified chronic kidney disease: Secondary | ICD-10-CM | POA: Diagnosis not present

## 2018-03-17 DIAGNOSIS — Z794 Long term (current) use of insulin: Secondary | ICD-10-CM | POA: Diagnosis not present

## 2018-03-18 LAB — HEPATITIS C ANTIBODY
Hepatitis C Ab: NONREACTIVE
SIGNAL TO CUT-OFF: 0.04 (ref <1.00)

## 2018-03-18 LAB — COMPREHENSIVE METABOLIC PANEL
AG Ratio: 1.9 (calc) (ref 1.0–2.5)
ALT: 17 U/L (ref 6–29)
AST: 18 U/L (ref 10–35)
Albumin: 4.5 g/dL (ref 3.6–5.1)
Alkaline phosphatase (APISO): 93 U/L (ref 33–130)
BUN/Creatinine Ratio: 27 (calc) — ABNORMAL HIGH (ref 6–22)
BUN: 32 mg/dL — ABNORMAL HIGH (ref 7–25)
CO2: 23 mmol/L (ref 20–32)
Calcium: 10.3 mg/dL (ref 8.6–10.4)
Chloride: 102 mmol/L (ref 98–110)
Creat: 1.17 mg/dL — ABNORMAL HIGH (ref 0.60–0.93)
Globulin: 2.4 g/dL (calc) (ref 1.9–3.7)
Glucose, Bld: 161 mg/dL — ABNORMAL HIGH (ref 65–99)
Potassium: 4.1 mmol/L (ref 3.5–5.3)
Sodium: 139 mmol/L (ref 135–146)
Total Bilirubin: 0.5 mg/dL (ref 0.2–1.2)
Total Protein: 6.9 g/dL (ref 6.1–8.1)

## 2018-03-18 LAB — LIPID PANEL
Cholesterol: 108 mg/dL (ref <200)
HDL: 36 mg/dL — ABNORMAL LOW (ref >50)
LDL Cholesterol (Calc): 38 mg/dL (calc)
Non-HDL Cholesterol (Calc): 72 mg/dL (calc) (ref <130)
Total CHOL/HDL Ratio: 3 (calc) (ref <5.0)
Triglycerides: 320 mg/dL — ABNORMAL HIGH (ref <150)

## 2018-03-18 LAB — HEMOGLOBIN A1C W/OUT EAG

## 2018-03-19 ENCOUNTER — Ambulatory Visit: Payer: Medicare Other | Admitting: Nurse Practitioner

## 2018-04-04 DIAGNOSIS — H2511 Age-related nuclear cataract, right eye: Secondary | ICD-10-CM | POA: Diagnosis not present

## 2018-04-04 DIAGNOSIS — Z01818 Encounter for other preprocedural examination: Secondary | ICD-10-CM | POA: Diagnosis not present

## 2018-04-07 DIAGNOSIS — H2511 Age-related nuclear cataract, right eye: Secondary | ICD-10-CM | POA: Diagnosis not present

## 2018-04-07 DIAGNOSIS — H25812 Combined forms of age-related cataract, left eye: Secondary | ICD-10-CM | POA: Diagnosis not present

## 2018-04-11 ENCOUNTER — Other Ambulatory Visit: Payer: Self-pay | Admitting: Family Medicine

## 2018-04-11 DIAGNOSIS — I129 Hypertensive chronic kidney disease with stage 1 through stage 4 chronic kidney disease, or unspecified chronic kidney disease: Secondary | ICD-10-CM

## 2018-04-11 DIAGNOSIS — E785 Hyperlipidemia, unspecified: Secondary | ICD-10-CM

## 2018-04-11 DIAGNOSIS — N183 Chronic kidney disease, stage 3 unspecified: Secondary | ICD-10-CM

## 2018-04-11 DIAGNOSIS — F329 Major depressive disorder, single episode, unspecified: Secondary | ICD-10-CM

## 2018-04-11 DIAGNOSIS — F32A Depression, unspecified: Secondary | ICD-10-CM

## 2018-04-11 DIAGNOSIS — E1169 Type 2 diabetes mellitus with other specified complication: Secondary | ICD-10-CM

## 2018-04-15 ENCOUNTER — Ambulatory Visit (INDEPENDENT_AMBULATORY_CARE_PROVIDER_SITE_OTHER): Payer: Medicare Other | Admitting: Nurse Practitioner

## 2018-04-15 ENCOUNTER — Other Ambulatory Visit: Payer: Self-pay

## 2018-04-15 ENCOUNTER — Encounter: Payer: Self-pay | Admitting: Nurse Practitioner

## 2018-04-15 VITALS — BP 128/65 | HR 80 | Ht 64.0 in | Wt 235.2 lb

## 2018-04-15 DIAGNOSIS — IMO0002 Reserved for concepts with insufficient information to code with codable children: Secondary | ICD-10-CM

## 2018-04-15 DIAGNOSIS — Z8739 Personal history of other diseases of the musculoskeletal system and connective tissue: Secondary | ICD-10-CM | POA: Diagnosis not present

## 2018-04-15 DIAGNOSIS — I129 Hypertensive chronic kidney disease with stage 1 through stage 4 chronic kidney disease, or unspecified chronic kidney disease: Secondary | ICD-10-CM

## 2018-04-15 DIAGNOSIS — E1169 Type 2 diabetes mellitus with other specified complication: Secondary | ICD-10-CM

## 2018-04-15 DIAGNOSIS — Z794 Long term (current) use of insulin: Secondary | ICD-10-CM

## 2018-04-15 DIAGNOSIS — F329 Major depressive disorder, single episode, unspecified: Secondary | ICD-10-CM | POA: Diagnosis not present

## 2018-04-15 DIAGNOSIS — N183 Chronic kidney disease, stage 3 unspecified: Secondary | ICD-10-CM

## 2018-04-15 DIAGNOSIS — B353 Tinea pedis: Secondary | ICD-10-CM | POA: Diagnosis not present

## 2018-04-15 DIAGNOSIS — F32A Depression, unspecified: Secondary | ICD-10-CM

## 2018-04-15 DIAGNOSIS — E1165 Type 2 diabetes mellitus with hyperglycemia: Secondary | ICD-10-CM | POA: Diagnosis not present

## 2018-04-15 DIAGNOSIS — E785 Hyperlipidemia, unspecified: Secondary | ICD-10-CM

## 2018-04-15 LAB — POCT GLYCOSYLATED HEMOGLOBIN (HGB A1C): Hemoglobin A1C: 9.4 % — AB (ref 4.0–5.6)

## 2018-04-15 MED ORDER — ATORVASTATIN CALCIUM 20 MG PO TABS: 20.0000 mg | ORAL_TABLET | Freq: Every day | ORAL | 3 refills | Status: DC

## 2018-04-15 MED ORDER — BUPROPION HCL ER (XL) 150 MG PO TB24: 150.0000 mg | ORAL_TABLET | Freq: Every day | ORAL | 3 refills | Status: DC

## 2018-04-15 MED ORDER — AMLODIPINE BESYLATE 10 MG PO TABS: 10.0000 mg | ORAL_TABLET | Freq: Every day | ORAL | 5 refills | Status: DC

## 2018-04-15 MED ORDER — METFORMIN HCL 1000 MG PO TABS
1000.0000 mg | ORAL_TABLET | Freq: Two times a day (BID) | ORAL | 3 refills | Status: DC
Start: 2018-04-15 — End: 2021-08-03

## 2018-04-15 MED ORDER — METOPROLOL TARTRATE 25 MG PO TABS: 25.0000 mg | ORAL_TABLET | Freq: Two times a day (BID) | ORAL | 3 refills | Status: DC

## 2018-04-15 MED ORDER — LOSARTAN POTASSIUM 50 MG PO TABS: 50.0000 mg | ORAL_TABLET | Freq: Every day | ORAL | 3 refills | Status: DC

## 2018-04-15 MED ORDER — INSULIN ASPART 100 UNIT/ML FLEXPEN: PEN_INJECTOR | SUBCUTANEOUS | 3 refills | Status: DC

## 2018-04-15 MED ORDER — METFORMIN HCL 1000 MG PO TABS: 1000.0000 mg | ORAL_TABLET | Freq: Two times a day (BID) | ORAL | 3 refills | Status: DC

## 2018-04-15 MED ORDER — GLUCOSE BLOOD VI STRP: ORAL_STRIP | 12 refills | Status: DC

## 2018-04-15 MED ORDER — INSULIN DETEMIR 100 UNIT/ML FLEXPEN: PEN_INJECTOR | SUBCUTANEOUS | 3 refills | Status: DC

## 2018-04-15 MED ORDER — CLOTRIMAZOLE 1 % EX CREA: 1.0000 "application " | TOPICAL_CREAM | Freq: Two times a day (BID) | CUTANEOUS | 1 refills | Status: DC

## 2018-04-15 MED ORDER — INSULIN PEN NEEDLE 32G X 4 MM MISC: 11 refills | Status: DC

## 2018-04-15 MED ORDER — EXENATIDE ER 2 MG ~~LOC~~ PEN: 2.0000 mg | PEN_INJECTOR | SUBCUTANEOUS | 3 refills | Status: DC

## 2018-04-15 NOTE — Patient Instructions (Addendum)
Heather Lopez,   Thank you for coming in to clinic today.  1. NOVOLOG: - Meal coverage: 6 units with every meal eaten.   - If you skip a meal, do not give your meal coverage.  DO give your correction scale.  Correction scale: < 120: No correction - only meal coverage with meals 120 - 149: 1 additional unit novolog (total of 7 units with a meal) 150 - 199:  2 additional units novolog 200 - 249: 3 additional units novolog 250 - 299: 4 additional units novolog 300 - 350: 5 additional units novolog 350 - 400: 6 additional units novolog > 400: call [facility] same day or next business day  2. Eat a low glycemic diet (handout) Low glycemic foods (5-6 daily) Moderate glycemic food (3-4 per week) Low glycemic foods (1 month)   No other medication changes today.  Uric acid today.  We may start a preventative medication for gout called allopurinol if uric acid is still higher than 8.0  Please schedule a follow-up appointment with Cassell Smiles, AGNP. Return in about 1 month (around 05/13/2018) for diabetes (Blood sugar log).  If you have any other questions or concerns, please feel free to call the clinic or send a message through [place]. You may also schedule an earlier appointment if necessary.  You will receive a survey after today's visit either digitally by e-mail or paper by [employer]. Your experiences and feedback matter to [place].  Please respond so we know how we are doing as we provide care for you.   Cassell Smiles, DNP, AGNP-BC Adult Gerontology Nurse Practitioner [place]

## 2018-04-15 NOTE — Progress Notes (Signed)
Subjective:    Patient ID: Heather Lopez, female    DOB: [birth year], 72 y.o.   MRN: [id number]  Heather Lopez is a 72 y.o. female presenting on 04/15/2018 for Diabetes   HPI Diabetes Pt presents today for follow up of Type 2 diabetes mellitus. She is checking fasting am CBG at home with a range of 121-250 - Current diabetic medications include: Metformin, Bydureon, Levemir 50 units day/44units night, Novolog 10 units tid wc - She is not currently symptomatic.  - She denies polydipsia, polyphagia, polyuria, headaches, diaphoresis, shakiness, chills, pain and numbness or tingling in extremities.   - Clinical course has been improving. - She  reports no regular exercise routine. - Her diet is moderate in salt, moderate in fat, and moderate in carbohydrates.  She reports her husband is an Firefighter for sweets, often encouraging her to share in a dessert she had previously been able to decline.  She notes he is beginning to recognize the importance of her efforts to eat significantly fewer carbohydrates.  Pt reports she had initially lost weight after her last appointment, but over last 1 month has regained this weight.  She notes blood sugars had also been in better control until 1 month ago. - Weight trend: increasing steadily  PREVENTION: Eye exam current (within one year): yes - cataract removal complete R eye, planned for left eye.  Retinopathy both eyes on exam 01/2018 Foot exam current (within one year): yes  Lipid/ASCVD risk reduction - on statin: yes Kidney protection - on ace or arb: yes Recent Labs    12/17/17 1410 03/17/18 0802 04/15/18 0855  HGBA1C 11.5 CANCELED 9.4*   Hypertension - She is not checking BP at home or outside of clinic.    - Current medications: losartan, amlodipine, metoprolol, tolerating well without side effects - She is not currently symptomatic. - Pt denies headache, lightheadedness, dizziness, changes in vision, chest tightness/pressure, palpitations, leg  swelling, sudden loss of speech or loss of consciousness.  Social History   Tobacco Use  . Smoking status: Former Smoker    Packs/day: 0.75    Years: 6.00    Pack years: 4.50    Types: Cigarettes  . Smokeless tobacco: Former Systems developer  . Tobacco comment: quit 1973  Substance Use Topics  . Alcohol use: No    Alcohol/week: 0.0 oz  . Drug use: No    Review of Systems Per HPI unless specifically indicated above     Objective:    BP 128/65 (BP Location: Right Arm, Patient Position: Sitting, Cuff Size: Large)   Pulse 80   Ht 5\' 4"  (1.626 m)   Wt 235 lb 3.2 oz (106.7 kg)   BMI 40.37 kg/m   Wt Readings from Last 3 Encounters:  04/15/18 235 lb 3.2 oz (106.7 kg)  12/17/17 232 lb (105.2 kg)  12/17/17 232 lb (105.2 kg)    Physical Exam  Constitutional: She is oriented to person, place, and time. She appears well-developed and well-nourished. No distress.  HENT:  Head: Normocephalic and atraumatic.  Cardiovascular: Normal rate, regular rhythm, S1 normal, S2 normal, normal heart sounds and intact distal pulses.  Pulmonary/Chest: Effort normal and breath sounds normal. No respiratory distress.  Neurological: She is alert and oriented to person, place, and time.  Skin: Skin is warm and dry.  Bilateral feet with persistent fungal skin infection.  Improved with clotrimazole, but not yet resolved.  Psychiatric: She has a normal mood and affect. Her behavior is normal.  Vitals  reviewed.    Results for orders placed or performed in visit on 04/15/18  POCT glycosylated hemoglobin (Hb A1C)  Result Value Ref Range   Hemoglobin A1C 9.4 (A) 4.0 - 5.6 %   HbA1c, POC (prediabetic range)  5.7 - 6.4 %   HbA1c, POC (controlled diabetic range)  0.0 - 7.0 %      Assessment & Plan:   Problem List Items Addressed This Visit      Cardiovascular and Mediastinum   Benign hypertension with CKD (chronic kidney disease) stage III ([place]) Well controlled hypertension.  BP goal < 130/80.  Pt is working  on lifestyle modifications.  Taking medications tolerating well without side effects. CKD stage III is stable on last labs.  No other complications.  Plan: 1. Continue taking medications without changes 2. Obtain labs at next 3 month followup  3. Encouraged heart healthy diet and increasing exercise to 30 minutes most days of the week. 4. Check BP 1-2 x per week at home, keep log, and bring to clinic at next appointment. 5. Follow up 3-4 months.     Relevant Medications   amLODipine (NORVASC) 10 MG tablet   atorvastatin (LIPITOR) 20 MG tablet   losartan (COZAAR) 50 MG tablet   metoprolol tartrate (LOPRESSOR) 25 MG tablet     Endocrine   Uncontrolled type 2 diabetes mellitus with insulin therapy ([place]) - Primary Improving control with focus on low glycemic diet.  Pt continues levemir, bydureon, metformin, and novolog 10 units tid prn without side effects.  A1c 9.4 improved from 11.5 and goal A1c < 0.0%. - Known complications of hyperclycemia, cataracts, non-proliferative DM retinopathy, CKD stage III - Pt notes possible reactive hyperglycemia as she occasionally skips breakfast, but still gives 10 units meal coverage.  Checked CBG after this once and was 100+ points higher than prior to medication administration  Plan:  1. Change therapy: Utilize Novolog meal coverage plus correction scale. - Meal coverage: 6 units with every meal eaten.  If meal skipped, do not give meal coverage.  DO give your correction scale.  Correction scale: < 120: No correction - only meal coverage with meals 120 - 149: 1 additional unit novolog (total of 7 units with a meal) 150 - 199:  2 additional units novolog 200 - 249: 3 additional units novolog 250 - 299: 4 additional units novolog 300 - 350: 5 additional units novolog 350 - 400: 6 additional units novolog > 400: call [place] same day or next business day  2. Encourage improved lifestyle: - low carb/low glycemic diet reinforced prior education, handout  provided again - Increase physical activity to 30 minutes most days of the week.  Explained that increased physical activity increases body's use of sugar for energy. 3. Check fasting am CBG and prior to every meal.  Bring log to next visit for review 4. Continue ARB and Statin 5. eye and foot exams up to date 6. Follow-up 4 weeks    Relevant Medications   Insulin Pen Needle 32G X 4 MM MISC   atorvastatin (LIPITOR) 20 MG tablet   Exenatide ER 2 MG PEN   insulin aspart (NOVOLOG FLEXPEN) 100 UNIT/ML FlexPen   Insulin Detemir (LEVEMIR FLEXTOUCH) 100 UNIT/ML Pen   losartan (COZAAR) 50 MG tablet   metFORMIN (GLUCOPHAGE) 1000 MG tablet   Other Relevant Orders   POCT glycosylated hemoglobin (Hb A1C) (Completed)   Hyperlipidemia associated with type 2 diabetes mellitus ([place]) Previously well controlled on labs. Pt requests refills today.  Relevant Medications   amLODipine (NORVASC) 10 MG tablet   atorvastatin (LIPITOR) 20 MG tablet   Exenatide ER 2 MG PEN   insulin aspart (NOVOLOG FLEXPEN) 100 UNIT/ML FlexPen   Insulin Detemir (LEVEMIR FLEXTOUCH) 100 UNIT/ML Pen   losartan (COZAAR) 50 MG tablet   metFORMIN (GLUCOPHAGE) 1000 MG tablet   metoprolol tartrate (LOPRESSOR) 25 MG tablet     Other   Depression Well controlled and stable.  Pt requests refill today.   Relevant Medications   buPROPion (WELLBUTRIN XL) 150 MG 24 hr tablet    Other Visit Diagnoses    Tinea pedis of both feet     Improving, but pt reports has only used for several days.  Requests refill.   Relevant Medications   clotrimazole (LOTRIMIN) 1 % cream   Personal history of gout     Pt with gout flare 4 weeks ago.  Uric acid level at that time = 9.6.  No ongoing preventative medications currently, but with CKD stage III want to avoid flares r/t treatments being nephrotoxic.  Plan: 1. Uric acid level today. 2. If needed for uric acid goal < 8.0, will start allopurinol. 3. Followup after labs.   Relevant Orders   Uric  acid      Meds ordered this encounter  Medications  . clotrimazole (LOTRIMIN) 1 % cream    Sig: Apply 1 application topically 2 (two) times daily.    Dispense:  60 g    Refill:  1    Order Specific Question:   Supervising Provider    Answer:   Olin Hauser [[id number]]  . DISCONTD: insulin aspart (NOVOLOG FLEXPEN) 100 UNIT/ML FlexPen    Sig: Use meal coverage 6 units with every meal plus correction scale three times daily up to total of 35 units per day.    Dispense:  45 mL    Refill:  3    Order Specific Question:   Supervising Provider    Answer:   Olin Hauser [[id number]]  . Insulin Pen Needle 32G X 4 MM MISC    Sig: Inject insulin pens up to 5 times daily    Dispense:  100 each    Refill:  11    Order Specific Question:   Supervising Provider    Answer:   Olin Hauser [[id number]]  . DISCONTD: Insulin Detemir (LEVEMIR FLEXTOUCH) 100 UNIT/ML Pen    Sig: INJECT 50 UNITS IN THE MORNING AND 44 UNITS IN THE EVENING    Dispense:  45 mL    Refill:  3    Order Specific Question:   Supervising Provider    Answer:   Olin Hauser [[id number]]  . DISCONTD: Exenatide ER 2 MG PEN    Sig: Inject 2 mg into the skin once a week.    Dispense:  12 each    Refill:  3    Order Specific Question:   Supervising Provider    Answer:   Olin Hauser [[id number]]  . DISCONTD: metFORMIN (GLUCOPHAGE) 1000 MG tablet    Sig: Take 1 tablet (1,000 mg total) by mouth 2 (two) times daily.    Dispense:  180 tablet    Refill:  3    Order Specific Question:   Supervising Provider    Answer:   Olin Hauser [[id number]]  . DISCONTD: metoprolol tartrate (LOPRESSOR) 25 MG tablet    Sig: Take 1 tablet (25 mg total) by mouth 2 (two) times daily.    Dispense:  180 tablet    Refill:  3    Order Specific Question:   Supervising Provider    Answer:   Olin Hauser [[id number]]  . DISCONTD: amLODipine (NORVASC) 10 MG tablet    Sig: Take 1 tablet (10 mg total) by mouth  daily.    Dispense:  30 tablet    Refill:  5    Order Specific Question:   Supervising Provider    Answer:   Olin Hauser [[id number]]  . DISCONTD: atorvastatin (LIPITOR) 20 MG tablet    Sig: Take 1 tablet (20 mg total) by mouth daily.    Dispense:  90 tablet    Refill:  3    Order Specific Question:   Supervising Provider    Answer:   Olin Hauser [[id number]]  . DISCONTD: losartan (COZAAR) 50 MG tablet    Sig: Take 1 tablet (50 mg total) by mouth daily.    Dispense:  90 tablet    Refill:  3    Order Specific Question:   Supervising Provider    Answer:   Olin Hauser [[id number]]  . amLODipine (NORVASC) 10 MG tablet    Sig: Take 1 tablet (10 mg total) by mouth daily.    Dispense:  30 tablet    Refill:  5    Order Specific Question:   Supervising Provider    Answer:   Olin Hauser [[id number]]  . atorvastatin (LIPITOR) 20 MG tablet    Sig: Take 1 tablet (20 mg total) by mouth daily.    Dispense:  90 tablet    Refill:  3    Order Specific Question:   Supervising Provider    Answer:   Olin Hauser [[id number]]  . buPROPion (WELLBUTRIN XL) 150 MG 24 hr tablet    Sig: Take 1 tablet (150 mg total) by mouth daily.    Dispense:  90 tablet    Refill:  3    Order Specific Question:   Supervising Provider    Answer:   Olin Hauser [[id number]]  . Exenatide ER 2 MG PEN    Sig: Inject 2 mg into the skin once a week.    Dispense:  12 each    Refill:  3    Order Specific Question:   Supervising Provider    Answer:   Olin Hauser [[id number]]  . glucose blood (FREESTYLE LITE) test strip    Sig: Use as instructed    Dispense:  100 each    Refill:  12    Order Specific Question:   Supervising Provider    Answer:   Olin Hauser [[id number]]  . insulin aspart (NOVOLOG FLEXPEN) 100 UNIT/ML FlexPen    Sig: Use meal coverage 6 units with every meal plus correction scale three times daily up to total of 35 units per day.    Dispense:  45 mL     Refill:  3    Order Specific Question:   Supervising Provider    Answer:   Olin Hauser [[id number]]  . Insulin Detemir (LEVEMIR FLEXTOUCH) 100 UNIT/ML Pen    Sig: INJECT 50 UNITS IN THE MORNING AND 44 UNITS IN THE EVENING    Dispense:  45 mL    Refill:  3    Order Specific Question:   Supervising Provider    Answer:   Olin Hauser [[id number]]  . losartan (COZAAR) 50 MG tablet    Sig: Take 1 tablet (50 mg total) by mouth  daily.    Dispense:  90 tablet    Refill:  3    Order Specific Question:   Supervising Provider    Answer:   Olin Hauser [[id number]]  . metFORMIN (GLUCOPHAGE) 1000 MG tablet    Sig: Take 1 tablet (1,000 mg total) by mouth 2 (two) times daily.    Dispense:  180 tablet    Refill:  3    Order Specific Question:   Supervising Provider    Answer:   Olin Hauser [[id number]]  . metoprolol tartrate (LOPRESSOR) 25 MG tablet    Sig: Take 1 tablet (25 mg total) by mouth 2 (two) times daily.    Dispense:  180 tablet    Refill:  3    Order Specific Question:   Supervising Provider    Answer:   Olin Hauser [[id number]]    Follow up plan: Return in about 1 month (around 05/13/2018) for diabetes (Blood sugar log).  Cassell Smiles, DNP, AGPCNP-BC Adult Gerontology Primary Care Nurse Practitioner [facility] 04/15/2018, 1:04 PM

## 2018-04-16 LAB — URIC ACID: Uric Acid, Serum: 8.6 mg/dL — ABNORMAL HIGH (ref 2.5–7.0)

## 2018-04-16 MED ORDER — ALLOPURINOL 100 MG PO TABS: 100.0000 mg | ORAL_TABLET | Freq: Every day | ORAL | 6 refills | Status: DC

## 2018-04-16 NOTE — Addendum Note (Signed)
Addended by: Cleaster Corin on: 04/16/2018 12:29 PM   Modules accepted: Orders

## 2018-04-28 DIAGNOSIS — H25812 Combined forms of age-related cataract, left eye: Secondary | ICD-10-CM | POA: Diagnosis not present

## 2018-04-28 DIAGNOSIS — H2512 Age-related nuclear cataract, left eye: Secondary | ICD-10-CM | POA: Diagnosis not present

## 2018-04-29 ENCOUNTER — Other Ambulatory Visit: Payer: Self-pay | Admitting: Nurse Practitioner

## 2018-04-29 DIAGNOSIS — Z8739 Personal history of other diseases of the musculoskeletal system and connective tissue: Secondary | ICD-10-CM

## 2018-05-20 ENCOUNTER — Ambulatory Visit: Payer: Medicare Other | Admitting: Nurse Practitioner

## 2018-05-26 ENCOUNTER — Other Ambulatory Visit: Payer: Self-pay

## 2018-05-26 DIAGNOSIS — F32A Depression, unspecified: Secondary | ICD-10-CM

## 2018-05-26 DIAGNOSIS — F329 Major depressive disorder, single episode, unspecified: Secondary | ICD-10-CM

## 2018-05-26 DIAGNOSIS — N183 Chronic kidney disease, stage 3 unspecified: Secondary | ICD-10-CM

## 2018-05-26 DIAGNOSIS — I129 Hypertensive chronic kidney disease with stage 1 through stage 4 chronic kidney disease, or unspecified chronic kidney disease: Secondary | ICD-10-CM

## 2018-05-26 DIAGNOSIS — Z8739 Personal history of other diseases of the musculoskeletal system and connective tissue: Secondary | ICD-10-CM

## 2018-05-26 MED ORDER — BUPROPION HCL ER (XL) 150 MG PO TB24: 150.0000 mg | ORAL_TABLET | Freq: Every day | ORAL | 1 refills | Status: DC

## 2018-05-26 MED ORDER — ALLOPURINOL 100 MG PO TABS: 100.0000 mg | ORAL_TABLET | Freq: Every day | ORAL | 1 refills | Status: DC

## 2018-05-26 MED ORDER — METOPROLOL TARTRATE 25 MG PO TABS: 25.0000 mg | ORAL_TABLET | Freq: Two times a day (BID) | ORAL | 1 refills | Status: DC

## 2018-06-05 ENCOUNTER — Other Ambulatory Visit: Payer: Self-pay

## 2018-06-05 DIAGNOSIS — IMO0002 Reserved for concepts with insufficient information to code with codable children: Secondary | ICD-10-CM

## 2018-06-05 DIAGNOSIS — E1165 Type 2 diabetes mellitus with hyperglycemia: Secondary | ICD-10-CM

## 2018-06-05 DIAGNOSIS — Z794 Long term (current) use of insulin: Principal | ICD-10-CM

## 2018-06-05 MED ORDER — EXENATIDE ER 2 MG ~~LOC~~ PEN: 2.0000 mg | PEN_INJECTOR | SUBCUTANEOUS | 3 refills | Status: DC

## 2018-06-11 DIAGNOSIS — R300 Dysuria: Secondary | ICD-10-CM | POA: Diagnosis not present

## 2018-06-11 DIAGNOSIS — N390 Urinary tract infection, site not specified: Secondary | ICD-10-CM | POA: Diagnosis not present

## 2018-06-11 DIAGNOSIS — R319 Hematuria, unspecified: Secondary | ICD-10-CM | POA: Diagnosis not present

## 2018-06-28 ENCOUNTER — Other Ambulatory Visit: Payer: Self-pay | Admitting: Nurse Practitioner

## 2018-06-28 DIAGNOSIS — E1165 Type 2 diabetes mellitus with hyperglycemia: Secondary | ICD-10-CM

## 2018-06-28 DIAGNOSIS — IMO0002 Reserved for concepts with insufficient information to code with codable children: Secondary | ICD-10-CM

## 2018-06-28 DIAGNOSIS — Z794 Long term (current) use of insulin: Principal | ICD-10-CM

## 2018-07-15 ENCOUNTER — Ambulatory Visit: Payer: Medicare Other | Admitting: Nurse Practitioner

## 2018-08-07 ENCOUNTER — Telehealth: Payer: Self-pay | Admitting: Nurse Practitioner

## 2018-08-07 NOTE — Telephone Encounter (Signed)
Called to re-schedule Medicare Annual Wellness Visit with the Nurse Health Advisor.   Thank you! For any questions please contact: Jill Alexanders [phone] or Skype at: [email or web address]

## 2018-11-04 ENCOUNTER — Telehealth: Payer: Self-pay | Admitting: Nurse Practitioner

## 2018-11-04 NOTE — Telephone Encounter (Signed)
resched awv

## 2018-12-23 ENCOUNTER — Ambulatory Visit: Payer: Medicare Other

## 2018-12-29 DIAGNOSIS — E1165 Type 2 diabetes mellitus with hyperglycemia: Secondary | ICD-10-CM | POA: Diagnosis not present

## 2018-12-29 DIAGNOSIS — Z1329 Encounter for screening for other suspected endocrine disorder: Secondary | ICD-10-CM | POA: Diagnosis not present

## 2018-12-29 DIAGNOSIS — Z8739 Personal history of other diseases of the musculoskeletal system and connective tissue: Secondary | ICD-10-CM | POA: Diagnosis not present

## 2018-12-29 DIAGNOSIS — Z780 Asymptomatic menopausal state: Secondary | ICD-10-CM | POA: Diagnosis not present

## 2018-12-29 DIAGNOSIS — E1122 Type 2 diabetes mellitus with diabetic chronic kidney disease: Secondary | ICD-10-CM | POA: Diagnosis not present

## 2018-12-29 DIAGNOSIS — Z794 Long term (current) use of insulin: Secondary | ICD-10-CM | POA: Diagnosis not present

## 2018-12-29 DIAGNOSIS — M25551 Pain in right hip: Secondary | ICD-10-CM | POA: Diagnosis not present

## 2018-12-29 DIAGNOSIS — E782 Mixed hyperlipidemia: Secondary | ICD-10-CM | POA: Diagnosis not present

## 2018-12-29 DIAGNOSIS — N183 Chronic kidney disease, stage 3 (moderate): Secondary | ICD-10-CM | POA: Diagnosis not present

## 2018-12-29 DIAGNOSIS — I10 Essential (primary) hypertension: Secondary | ICD-10-CM | POA: Diagnosis not present

## 2018-12-29 DIAGNOSIS — R8290 Unspecified abnormal findings in urine: Secondary | ICD-10-CM | POA: Diagnosis not present

## 2018-12-29 DIAGNOSIS — R300 Dysuria: Secondary | ICD-10-CM | POA: Diagnosis not present

## 2018-12-29 DIAGNOSIS — F329 Major depressive disorder, single episode, unspecified: Secondary | ICD-10-CM | POA: Diagnosis not present

## 2019-01-05 DIAGNOSIS — M25551 Pain in right hip: Secondary | ICD-10-CM | POA: Diagnosis not present

## 2019-01-05 DIAGNOSIS — M1611 Unilateral primary osteoarthritis, right hip: Secondary | ICD-10-CM | POA: Diagnosis not present

## 2019-01-29 DIAGNOSIS — Z794 Long term (current) use of insulin: Secondary | ICD-10-CM | POA: Diagnosis not present

## 2019-01-29 DIAGNOSIS — E1122 Type 2 diabetes mellitus with diabetic chronic kidney disease: Secondary | ICD-10-CM | POA: Diagnosis not present

## 2019-01-29 DIAGNOSIS — I10 Essential (primary) hypertension: Secondary | ICD-10-CM | POA: Diagnosis not present

## 2019-01-29 DIAGNOSIS — N183 Chronic kidney disease, stage 3 (moderate): Secondary | ICD-10-CM | POA: Diagnosis not present

## 2019-01-29 DIAGNOSIS — E782 Mixed hyperlipidemia: Secondary | ICD-10-CM | POA: Diagnosis not present

## 2019-04-23 DIAGNOSIS — N390 Urinary tract infection, site not specified: Secondary | ICD-10-CM | POA: Diagnosis not present

## 2019-06-16 ENCOUNTER — Other Ambulatory Visit: Payer: Self-pay | Admitting: Nurse Practitioner

## 2019-06-26 ENCOUNTER — Other Ambulatory Visit: Payer: Self-pay

## 2019-06-29 DIAGNOSIS — N183 Chronic kidney disease, stage 3 (moderate): Secondary | ICD-10-CM | POA: Diagnosis not present

## 2019-06-29 DIAGNOSIS — I10 Essential (primary) hypertension: Secondary | ICD-10-CM | POA: Diagnosis not present

## 2019-06-29 DIAGNOSIS — Z8639 Personal history of other endocrine, nutritional and metabolic disease: Secondary | ICD-10-CM | POA: Diagnosis not present

## 2019-06-29 DIAGNOSIS — E782 Mixed hyperlipidemia: Secondary | ICD-10-CM | POA: Diagnosis not present

## 2019-06-29 DIAGNOSIS — Z794 Long term (current) use of insulin: Secondary | ICD-10-CM | POA: Diagnosis not present

## 2019-06-29 DIAGNOSIS — E1122 Type 2 diabetes mellitus with diabetic chronic kidney disease: Secondary | ICD-10-CM | POA: Diagnosis not present

## 2019-09-29 DIAGNOSIS — R8290 Unspecified abnormal findings in urine: Secondary | ICD-10-CM | POA: Diagnosis not present

## 2019-09-29 DIAGNOSIS — E1165 Type 2 diabetes mellitus with hyperglycemia: Secondary | ICD-10-CM | POA: Diagnosis not present

## 2019-09-29 DIAGNOSIS — Z23 Encounter for immunization: Secondary | ICD-10-CM | POA: Diagnosis not present

## 2019-09-29 DIAGNOSIS — R300 Dysuria: Secondary | ICD-10-CM | POA: Diagnosis not present

## 2019-09-29 DIAGNOSIS — R351 Nocturia: Secondary | ICD-10-CM | POA: Diagnosis not present

## 2019-09-29 DIAGNOSIS — Z792 Long term (current) use of antibiotics: Secondary | ICD-10-CM | POA: Diagnosis not present

## 2019-09-29 DIAGNOSIS — Z87891 Personal history of nicotine dependence: Secondary | ICD-10-CM | POA: Diagnosis not present

## 2019-09-29 DIAGNOSIS — Z794 Long term (current) use of insulin: Secondary | ICD-10-CM | POA: Diagnosis not present

## 2019-10-19 ENCOUNTER — Telehealth: Payer: Self-pay | Admitting: Nurse Practitioner

## 2019-10-19 NOTE — Chronic Care Management (AMB) (Signed)
°  Chronic Care Management   Outreach Note  10/19/2019 Name: Heather Lopez MRN: [id number] DOB: 01-Sep-1946  Referred by: Mikey College, NP (Inactive) Reason for referral : Chronic Care Management (Initial CCM outreach was unsuccessful.)   An unsuccessful telephone outreach was attempted today. The patient was referred to the case management team by for assistance with care management and care coordination.   Follow Up Plan: A HIPPA compliant phone message was left for the patient providing contact information and requesting a return call.  The care management team will reach out to the patient again over the next 7 days.  If patient returns call to provider office, please advise to call [place] at [place], [place] [facility]  [place], [place] [address] Direct Dial: [email or web address]  Website: [email or web address]

## 2019-11-04 NOTE — Chronic Care Management (AMB) (Signed)
°  Chronic Care Management   Outreach Note  11/04/2019 Name: Heather Lopez MRN: [id number] DOB: [birth year]  Referred by: Mikey College, NP (Inactive) Reason for referral : Chronic Care Management (Initial CCM outreach was unsuccessful.) and Chronic Care Management (Second CCM outreach was unsuccessful.)   A second unsuccessful telephone outreach was attempted today. The patient was referred to the case management team for assistance with care management and care coordination.   Follow Up Plan: A HIPPA compliant phone message was left for the patient providing contact information and requesting a return call.  The care management team will reach out to the patient again over the next 7 days.  If patient returns call to provider office, please advise to call [facility] at [place], [place] [facility]  [place], [place] [address] Direct Dial: [email or web address]  Website: [email or web address]

## 2019-11-09 NOTE — Chronic Care Management (AMB) (Signed)
  Chronic Care Management   Outreach Note  11/09/2019 Name: Heather Lopez MRN: [id number] DOB: [birth year]  Referred by: Mikey College, NP (Inactive) Reason for referral : Chronic Care Management (Initial CCM outreach was unsuccessful.), Chronic Care Management (Second CCM outreach was unsuccessful.), and Chronic Care Management (Third CCM outreach was unsuccessful.)   Third unsuccessful telephone outreach was attempted today. The patient was referred to the case management team for assistance with care management and care coordination. The patient's primary care provider has been notified of our unsuccessful attempts to make or maintain contact with the patient. The care management team is pleased to engage with this patient at any time in the future should he/she be interested in assistance from the care management team.   Follow Up Plan:  [place], [place]  [place], [place] [address] Direct Dial: [email or web address]  Website: [email or web address]

## 2019-11-11 ENCOUNTER — Ambulatory Visit (INDEPENDENT_AMBULATORY_CARE_PROVIDER_SITE_OTHER): Payer: Medicare Other | Admitting: Urology

## 2019-11-11 ENCOUNTER — Encounter: Payer: Self-pay | Admitting: Urology

## 2019-11-11 ENCOUNTER — Other Ambulatory Visit: Payer: Self-pay

## 2019-11-11 VITALS — BP 145/74 | HR 84 | Ht 64.0 in | Wt 231.2 lb

## 2019-11-11 DIAGNOSIS — R350 Frequency of micturition: Secondary | ICD-10-CM | POA: Diagnosis not present

## 2019-11-11 DIAGNOSIS — R300 Dysuria: Secondary | ICD-10-CM | POA: Diagnosis not present

## 2019-11-11 DIAGNOSIS — R3129 Other microscopic hematuria: Secondary | ICD-10-CM | POA: Diagnosis not present

## 2019-11-11 LAB — URINALYSIS, COMPLETE
Bilirubin, UA: NEGATIVE
Glucose, UA: NEGATIVE
Ketones, UA: NEGATIVE
Nitrite, UA: NEGATIVE
Specific Gravity, UA: 1.025 (ref 1.005–1.030)
Urobilinogen, Ur: 0.2 mg/dL (ref 0.2–1.0)
pH, UA: 5.5 (ref 5.0–7.5)

## 2019-11-11 LAB — MICROSCOPIC EXAMINATION: WBC, UA: >30 /hpf — AB (ref 0–5)

## 2019-11-11 MED ORDER — SOLIFENACIN SUCCINATE 5 MG PO TABS: 5.0000 mg | ORAL_TABLET | Freq: Every day | ORAL | 3 refills | Status: DC

## 2019-11-11 NOTE — Progress Notes (Signed)
11/11/2019 8:58 AM   Heather Lopez 06-Apr-1946 [id number]  Referring provider: [name], PA-C [address] [place],  [place] [address]  Chief Complaint  Patient presents with  . Dysuria    HPI:  [name] was referred over for dysuria since May 2020. She has frequency and nocturia. She has urgency. She underwent urinalysis which showed greater than 50 red blood cells on 3 separate occasions since 2016.  Urine culture was mixed growth. She was told it was "not a UTI". Abx no help. Creatinine usually runs between 1.2-1.4 and she has anaphylaxis reported to contrast media.  No constipation - she has loose stools. No gross hematuria. Remote smoker for 6-7 yrs but quit almost 50 yrs ago. No other exposure. No NG risk. No incontinence apart from rare UUI.   She has a h/o stones with last ESWL 10-15 yrs ago.   PMH: Past Medical History:  Diagnosis Date  . Diabetes mellitus without complication ([place])   . Hyperlipidemia   . Hypertension   . Vertigo     Surgical History: Past Surgical History:  Procedure Laterality Date  . COLONOSCOPY WITH PROPOFOL N/A 08/19/2015   Procedure: COLONOSCOPY WITH PROPOFOL;  Surgeon: Lucilla Lame, MD;  Location: [facility];  Service: Endoscopy;  Laterality: N/A;  diabetic - insulin  . POLYPECTOMY  08/19/2015   Procedure: POLYPECTOMY;  Surgeon: Lucilla Lame, MD;  Location: [facility];  Service: Endoscopy;;  . TUBAL LIGATION  1992    Home Medications:  Allergies as of 11/11/2019      Reactions   Contrast Media  [iodinated Diagnostic Agents] Anaphylaxis   Iodine Swelling   (IV only) - angioedema      Medication List       Accurate as of November 11, 2019  8:58 AM. If you have any questions, ask your nurse or doctor.        STOP taking these medications   celecoxib 200 MG capsule Commonly known as: CELEBREX Stopped by: Festus Aloe, MD   clotrimazole 1 % cream Commonly known as: LOTRIMIN Stopped  by: Festus Aloe, MD   meclizine 25 MG tablet Commonly known as: ANTIVERT Stopped by: Festus Aloe, MD     TAKE these medications   allopurinol 100 MG tablet Commonly known as: ZYLOPRIM Take 1 tablet (100 mg total) by mouth daily.   amLODipine 10 MG tablet Commonly known as: NORVASC Take 1 tablet (10 mg total) by mouth daily.   aspirin EC 81 MG tablet Take 81 mg by mouth daily.   atorvastatin 20 MG tablet Commonly known as: LIPITOR Take 1 tablet (20 mg total) by mouth daily.   buPROPion 150 MG 24 hr tablet Commonly known as: WELLBUTRIN XL Take 1 tablet (150 mg total) by mouth daily.   ergocalciferol 1.25 MG (50000 UT) capsule Commonly known as: VITAMIN D2 ergocalciferol (vitamin D2) 1,250 mcg (50,000 unit) capsule   Exenatide ER 2 MG Pen Inject 2 mg into the skin once a week.   freestyle lancets USE AS INSTRUCTED   FreeStyle Lite Devi 1 Device by Does not apply route 2 (two) times daily.   glucose blood test strip Commonly known as: FREESTYLE LITE Use as instructed   insulin aspart 100 UNIT/ML FlexPen Commonly known as: NovoLOG FlexPen Use meal coverage 6 units with every meal plus correction scale three times daily up to total of 35 units per day.   Insulin Pen Needle 32G X 4 MM Misc Inject insulin pens  up to 5 times daily   Levemir FlexTouch 100 UNIT/ML Pen Generic drug: Insulin Detemir INJECT 50 UNITS IN THE MORNING AND 44 UNITS IN THE EVENING   losartan 50 MG tablet Commonly known as: COZAAR Take 1 tablet (50 mg total) by mouth daily.   metFORMIN 1000 MG tablet Commonly known as: GLUCOPHAGE Take 1 tablet (1,000 mg total) by mouth 2 (two) times daily.   metoprolol tartrate 25 MG tablet Commonly known as: LOPRESSOR Take 1 tablet (25 mg total) by mouth 2 (two) times daily.   Multi-Vitamin tablet Take by mouth.       Allergies:  Allergies  Allergen Reactions  . Contrast Media  [Iodinated Diagnostic Agents] Anaphylaxis  . Iodine  Swelling    (IV only) - angioedema    Family History: Family History  Problem Relation Age of Onset  . Diabetes Father   . Cancer Father        lung cancer  . Diabetes Brother     Social History:  reports that she has quit smoking. Her smoking use included cigarettes. She has a 4.50 pack-year smoking history. She has quit using smokeless tobacco. She reports that she does not drink alcohol or use drugs.  ROS: UROLOGY Frequent Urination?: Yes Hard to postpone urination?: No Burning/pain with urination?: Yes Get up at night to urinate?: Yes Leakage of urine?: No Urine stream starts and stops?: No Trouble starting stream?: No Do you have to strain to urinate?: No Blood in urine?: No Urinary tract infection?: No Sexually transmitted disease?: No Injury to kidneys or bladder?: No Painful intercourse?: No Weak stream?: No Currently pregnant?: No Vaginal bleeding?: No Last menstrual period?: N  Gastrointestinal Nausea?: No Vomiting?: No Indigestion/heartburn?: No Diarrhea?: No Constipation?: No  Constitutional Fever: No Night sweats?: No Weight loss?: No Fatigue?: No  Skin Skin rash/lesions?: No Itching?: No  Eyes Blurred vision?: No Double vision?: No  Ears/Nose/Throat Sore throat?: No Sinus problems?: No  Hematologic/Lymphatic Swollen glands?: No Easy bruising?: No  Cardiovascular Leg swelling?: Yes Chest pain?: No  Respiratory Cough?: No Shortness of breath?: No  Endocrine Excessive thirst?: No  Musculoskeletal Back pain?: No Joint pain?: No  Neurological Headaches?: No Dizziness?: No  Psychologic Depression?: No Anxiety?: No  Physical Exam: BP (!) 145/74   Pulse 84   Ht 5\' 4"  (1.626 m)   Wt 231 lb 3.2 oz (104.9 kg)   BMI 39.69 kg/m   Constitutional:  Alert and oriented, No acute distress. HEENT: [place] AT, moist mucus membranes.  Trachea midline, no masses. Cardiovascular: No clubbing, cyanosis, or edema. Respiratory: Normal  respiratory effort, no increased work of breathing. GI: Abdomen is soft, nontender, nondistended, no abdominal masses GU: No CVA tenderness Skin: No rashes, bruises or suspicious lesions. Neurologic: Grossly intact, no focal deficits, moving all 4 extremities. Psychiatric: Normal mood and affect.  Laboratory Data: Lab Results  Component Value Date   WBC 13.3 (H) 01/28/2014   HGB 13.9 01/29/2014   HCT 42.4 01/28/2014   MCV 89 01/28/2014   PLT 217 01/29/2014    Lab Results  Component Value Date   CREATININE 1.17 (H) 03/17/2018    No results found for: PSA  No results found for: TESTOSTERONE  Lab Results  Component Value Date   HGBA1C 9.4 (A) 04/15/2018    Urinalysis    Component Value Date/Time   COLORURINE Yellow 01/27/2014 1350   APPEARANCEUR Hazy 01/27/2014 1350   LABSPEC 1.024 01/27/2014 1350   PHURINE 5.0 01/27/2014 1350   GLUCOSEU >=  500 01/27/2014 1350   HGBUR 1+ 01/27/2014 1350   BILIRUBINUR negative 12/17/2017 1524   BILIRUBINUR Negative 01/27/2014 1350   KETONESUR Negative 01/27/2014 1350   PROTEINUR 300 12/17/2017 1524   PROTEINUR >=500 01/27/2014 1350   UROBILINOGEN 0.2 12/17/2017 1524   NITRITE negative 12/17/2017 1524   NITRITE Negative 01/27/2014 1350   LEUKOCYTESUR Trace (A) 12/17/2017 1524   LEUKOCYTESUR 2+ 01/27/2014 1350    Lab Results  Component Value Date   LABMICR 182.7 07/19/2015   BACTERIA NONE SEEN 01/27/2014    Pertinent Imaging: n/a No results found for this or any previous visit. No results found for this or any previous visit. No results found for this or any previous visit. No results found for this or any previous visit. No results found for this or any previous visit. No results found for this or any previous visit. No results found for this or any previous visit. No results found for this or any previous visit.  Assessment & Plan:    1. Dysuria and microhematuria - eval with CT non-con and cystoscopy.  - Urinalysis,  Complete  2. Frequency, urgency - discussed  Petra Kuba r/b/a to anticholinergics and I sent in solifenacin 5 mg.   No follow-ups on file.  Festus Aloe, MD  [facility] [address] [place], [place] [address] [phone]

## 2019-11-11 NOTE — Patient Instructions (Signed)
Cystoscopy Cystoscopy is a procedure that is used to help diagnose and sometimes treat conditions that affect the lower urinary tract. The lower urinary tract includes the bladder and the urethra. The urethra is the tube that drains urine from the bladder. Cystoscopy is done using a thin, tube-shaped instrument with a light and camera at the end (cystoscope). The cystoscope may be hard or flexible, depending on the goal of the procedure. The cystoscope is inserted through the urethra, into the bladder. Cystoscopy may be recommended if you have:  Urinary tract infections that keep coming back.  Blood in the urine (hematuria).  An inability to control when you urinate (urinary incontinence) or an overactive bladder.  Unusual cells found in a urine sample.  A blockage in the urethra, such as a urinary stone.  Painful urination.  An abnormality in the bladder found during an intravenous pyelogram (IVP) or CT scan. Cystoscopy may also be done to remove a sample of tissue to be examined under a microscope (biopsy). Tell a health care provider about:  Any allergies you have.  All medicines you are taking, including vitamins, herbs, eye drops, creams, and over-the-counter medicines.  Any problems you or family members have had with anesthetic medicines.  Any blood disorders you have.  Any surgeries you have had.  Any medical conditions you have.  Whether you are pregnant or may be pregnant. What are the risks? Generally, this is a safe procedure. However, problems may occur, including:  Infection.  Bleeding.  Allergic reactions to medicines.  Damage to other structures or organs. What happens before the procedure?  Ask your health care provider about: ? Changing or stopping your regular medicines. This is especially important if you are taking diabetes medicines or blood thinners. ? Taking medicines such as aspirin and ibuprofen. These medicines can thin your blood. Do not take  these medicines unless your health care provider tells you to take them. ? Taking over-the-counter medicines, vitamins, herbs, and supplements.  Follow instructions from your health care provider about eating or drinking restrictions.  Ask your health care provider what steps will be taken to help prevent infection. These may include: ? Washing skin with a germ-killing soap. ? Taking antibiotic medicine.  You may have an exam or testing, such as: ? X-rays of the bladder, urethra, or kidneys. ? Urine tests to check for signs of infection.  Plan to have someone take you home from the hospital or clinic. What happens during the procedure?   You will be given one or more of the following: ? A medicine to help you relax (sedative). ? A medicine to numb the area (local anesthetic).  The area around the opening of your urethra will be cleaned.  The cystoscope will be passed through your urethra into your bladder.  Germ-free (sterile) fluid will flow through the cystoscope to fill your bladder. The fluid will stretch your bladder so that your health care provider can clearly examine your bladder walls.  Your doctor will look at the urethra and bladder. Your doctor may take a biopsy or remove stones.  The cystoscope will be removed, and your bladder will be emptied. The procedure may vary among health care providers and hospitals. What can I expect after the procedure? After the procedure, it is common to have:  Some soreness or pain in your abdomen and urethra.  Urinary symptoms. These include: ? Mild pain or burning when you urinate. Pain should stop within a few minutes after you urinate. This   may last for up to 1 week. ? A small amount of blood in your urine for several days. ? Feeling like you need to urinate but producing only a small amount of urine. Follow these instructions at home: Medicines  Take over-the-counter and prescription medicines only as told by your health care  provider.  If you were prescribed an antibiotic medicine, take it as told by your health care provider. Do not stop taking the antibiotic even if you start to feel better. General instructions  Return to your normal activities as told by your health care provider. Ask your health care provider what activities are safe for you.  Do not drive for 24 hours if you were given a sedative during your procedure.  Watch for any blood in your urine. If the amount of blood in your urine increases, call your health care provider.  Follow instructions from your health care provider about eating or drinking restrictions.  If a tissue sample was removed for testing (biopsy) during your procedure, it is up to you to get your test results. Ask your health care provider, or the department that is doing the test, when your results will be ready.  Drink enough fluid to keep your urine pale yellow.  Keep all follow-up visits as told by your health care provider. This is important. Contact a health care provider if you:  Have pain that gets worse or does not get better with medicine, especially pain when you urinate.  Have trouble urinating.  Have more blood in your urine. Get help right away if you:  Have blood clots in your urine.  Have abdominal pain.  Have a fever or chills.  Are unable to urinate. Summary  Cystoscopy is a procedure that is used to help diagnose and sometimes treat conditions that affect the lower urinary tract.  Cystoscopy is done using a thin, tube-shaped instrument with a light and camera at the end.  After the procedure, it is common to have some soreness or pain in your abdomen and urethra.  Watch for any blood in your urine. If the amount of blood in your urine increases, call your health care provider.  If you were prescribed an antibiotic medicine, take it as told by your health care provider. Do not stop taking the antibiotic even if you start to feel better. This  information is not intended to replace advice given to you by your health care provider. Make sure you discuss any questions you have with your health care provider. Document Released: 11/09/2000 Document Revised: 11/04/2018 Document Reviewed: 11/04/2018 Elsevier Patient Education  2020 [employer]  

## 2019-11-26 ENCOUNTER — Telehealth: Payer: Self-pay | Admitting: Urology

## 2019-11-26 NOTE — Telephone Encounter (Signed)
Left patient a detailed message per DPR , that our office has not received a prior auth, but would be glad to start one if they pharmacy faxes over

## 2019-11-26 NOTE — Telephone Encounter (Signed)
Pt called and asked if the PA has been done for [facility] that was written for her at her last visit. Please advise.

## 2019-12-15 ENCOUNTER — Ambulatory Visit
Admission: RE | Admit: 2019-12-15 | Discharge: 2019-12-15 | Disposition: A | Discharge status: Home / Self Care | Payer: Medicare Other | Source: Ambulatory Visit | Attending: Urology | Admitting: Urology

## 2019-12-15 ENCOUNTER — Other Ambulatory Visit: Payer: Self-pay

## 2019-12-15 DIAGNOSIS — R300 Dysuria: Secondary | ICD-10-CM | POA: Diagnosis present

## 2019-12-15 DIAGNOSIS — R3129 Other microscopic hematuria: Secondary | ICD-10-CM | POA: Diagnosis present

## 2019-12-18 ENCOUNTER — Encounter: Payer: Self-pay | Admitting: Urology

## 2019-12-18 ENCOUNTER — Other Ambulatory Visit: Payer: Self-pay

## 2019-12-18 ENCOUNTER — Other Ambulatory Visit
Admission: RE | Admit: 2019-12-18 | Discharge: 2019-12-18 | Disposition: A | Discharge status: Home / Self Care | Payer: Medicare Other | Source: Ambulatory Visit | Attending: Urology | Admitting: Urology

## 2019-12-18 ENCOUNTER — Ambulatory Visit (INDEPENDENT_AMBULATORY_CARE_PROVIDER_SITE_OTHER): Payer: Medicare Other | Admitting: Urology

## 2019-12-18 ENCOUNTER — Other Ambulatory Visit: Payer: Self-pay | Admitting: Radiology

## 2019-12-18 VITALS — BP 129/67 | HR 88 | Ht 64.0 in | Wt 230.0 lb

## 2019-12-18 DIAGNOSIS — Z20822 Contact with and (suspected) exposure to covid-19: Secondary | ICD-10-CM | POA: Insufficient documentation

## 2019-12-18 DIAGNOSIS — R3129 Other microscopic hematuria: Secondary | ICD-10-CM | POA: Diagnosis not present

## 2019-12-18 DIAGNOSIS — N200 Calculus of kidney: Secondary | ICD-10-CM | POA: Diagnosis not present

## 2019-12-18 DIAGNOSIS — N1339 Other hydronephrosis: Secondary | ICD-10-CM

## 2019-12-18 DIAGNOSIS — Z01812 Encounter for preprocedural laboratory examination: Secondary | ICD-10-CM | POA: Diagnosis present

## 2019-12-18 MED ORDER — NITROFURANTOIN MONOHYD MACRO 100 MG PO CAPS: 100.0000 mg | ORAL_CAPSULE | Freq: Every day | ORAL | 0 refills | Status: DC

## 2019-12-18 NOTE — Progress Notes (Signed)
   12/18/19  CC:  Chief Complaint  Patient presents with  . Cysto    HPI:  F/u -   1) MH, dysuria - since May 2020. She has frequency and nocturia. She has urgency. She underwent urinalysis which showed greater than 50 red blood cells on 3 separate occasions since 2016.  Urine culture was mixed growth. She was told it was "not a UTI". Abx no help. Creatinine usually runs between 1.2-1.4 and she has anaphylaxis reported to contrast media. No constipation - she has loose stools. No gross hematuria. Remote smoker for 6-7 yrs but quit almost 50 yrs ago. No other exposure. No NG risk. No incontinence apart from rare UUI.   2) left upper pole stone, right hydronephrosis - She has a h/o stones with last ESWL 10-15 yrs ago. CT 12/14/2018 revealed right hydroureteronephrosis down to the bladder without a stone.  The bladder had a somewhat symmetric thick wall.  She had a left upper pole stone filling the infundibulum and two calyces about 2.8 cm in length but only 6 mm in width and Hounsfield units around 500-800.Also a 11 x 6 mm RLP stone.   She returns for cystoscopy. She c/o dysuria. No flank pain or stone passage.   There were no vitals taken for this visit. NED. A&Ox3.   No respiratory distress   Abd soft, NT, ND Normal external genitalia with patent urethral meatus Vaginal atrophy, retracted meatus  Chaperone - Carrie for cysto and exam   Cystoscopy Procedure Note  Patient identification was confirmed, informed consent was obtained, and patient was prepped using Betadine solution.  Lidocaine jelly was administered per urethral meatus.    Procedure: - Flexible cystoscope introduced, without any difficulty.   - Thorough search of the bladder revealed: Very difficult cysto. Urine cloudy and mucosa erythematous and raw. We irrigated several times but she will need a better look in the OR.   - Ureteral orifices were normal in position and appearance.  Post-Procedure: - Patient tolerated  the procedure well  Assessment/ Plan:  1) microhematuria - she may need a bladder biopsy. Urine sent for culture and I'll start on a nightly NF in prep for OR.   2) right hydro, right lower pole stone, left upper pole stone - Discussed evaluation in the operating room with one of my colleagues to include cystoscopy with bladder biopsy, bilateral retrograde pyelogram, possible right ureteroscopy and ureteral stent, left ureteroscopy with laser lithotripsy (possibly staged) and ureteral stent.  All questions answered. We discussed how RGP differ from IV contrast. She elects to proceed.    No follow-ups on file.  Festus Aloe, MD

## 2019-12-18 NOTE — H&P (View-Only) (Signed)
   12/18/19  CC:  Chief Complaint  Patient presents with  . Cysto    HPI:  F/u -   1) MH, dysuria - since May 2020. She has frequency and nocturia. She has urgency. She underwent urinalysis which showed greater than 50 red blood cells on 3 separate occasions since 2016.  Urine culture was mixed growth. She was told it was "not a UTI". Abx no help. Creatinine usually runs between 1.2-1.4 and she has anaphylaxis reported to contrast media. No constipation - she has loose stools. No gross hematuria. Remote smoker for 6-7 yrs but quit almost 50 yrs ago. No other exposure. No NG risk. No incontinence apart from rare UUI.   2) left upper pole stone, right hydronephrosis - She has a h/o stones with last ESWL 10-15 yrs ago. CT 12/14/2018 revealed right hydroureteronephrosis down to the bladder without a stone.  The bladder had a somewhat symmetric thick wall.  She had a left upper pole stone filling the infundibulum and two calyces about 2.8 cm in length but only 6 mm in width and Hounsfield units around 500-800.Also a 11 x 6 mm RLP stone.   She returns for cystoscopy. She c/o dysuria. No flank pain or stone passage.   There were no vitals taken for this visit. NED. A&Ox3.   No respiratory distress   Abd soft, NT, ND Normal external genitalia with patent urethral meatus Vaginal atrophy, retracted meatus  Chaperone - Carrie for cysto and exam   Cystoscopy Procedure Note  Patient identification was confirmed, informed consent was obtained, and patient was prepped using Betadine solution.  Lidocaine jelly was administered per urethral meatus.    Procedure: - Flexible cystoscope introduced, without any difficulty.   - Thorough search of the bladder revealed: Very difficult cysto. Urine cloudy and mucosa erythematous and raw. We irrigated several times but she will need a better look in the OR.   - Ureteral orifices were normal in position and appearance.  Post-Procedure: - Patient tolerated  the procedure well  Assessment/ Plan:  1) microhematuria - she may need a bladder biopsy. Urine sent for culture and I'll start on a nightly NF in prep for OR.   2) right hydro, right lower pole stone, left upper pole stone - Discussed evaluation in the operating room with one of my colleagues to include cystoscopy with bladder biopsy, bilateral retrograde pyelogram, possible right ureteroscopy and ureteral stent, left ureteroscopy with laser lithotripsy (possibly staged) and ureteral stent.  All questions answered. We discussed how RGP differ from IV contrast. She elects to proceed.    No follow-ups on file.  Festus Aloe, MD

## 2019-12-18 NOTE — Patient Instructions (Signed)
Ureteroscopy Ureteroscopy is a procedure to check for and treat problems inside part of the urinary tract. In this procedure, a thin, tube-shaped instrument with a light at the end (ureteroscope) is used to look at the inside of the kidneys and the ureters, which are the tubes that carry urine from the kidneys to the bladder. The ureteroscope is inserted into one or both of the ureters. You may need this procedure if you have frequent urinary tract infections (UTIs), blood in your urine, or a stone in one of your ureters. A ureteroscopy can be done to find the cause of urine blockage in a ureter and to evaluate other abnormalities inside the ureters or kidneys. If stones are found, they can be removed during the procedure. Polyps, abnormal tissue, and some types of tumors can also be removed or treated. The ureteroscope may also have a tool to remove tissue to be checked for disease under a microscope (biopsy). Tell a health care provider about:  Any allergies you have.  All medicines you are taking, including vitamins, herbs, eye drops, creams, and over-the-counter medicines.  Any problems you or family members have had with anesthetic medicines.  Any blood disorders you have.  Any surgeries you have had.  Any medical conditions you have.  Whether you are pregnant or may be pregnant. What are the risks? Generally, this is a safe procedure. However, problems may occur, including:  Bleeding.  Infection.  Allergic reactions to medicines.  Scarring that narrows the ureter (stricture).  Creating a hole in the ureter (perforation). What happens before the procedure? Staying hydrated Follow instructions from your health care provider about hydration, which may include:  Up to 2 hours before the procedure - you may continue to drink clear liquids, such as water, clear fruit juice, black coffee, and plain tea. Eating and drinking restrictions Follow instructions from your health care  provider about eating and drinking, which may include:  8 hours before the procedure - stop eating heavy meals or foods such as meat, fried foods, or fatty foods.  6 hours before the procedure - stop eating light meals or foods, such as toast or cereal.  6 hours before the procedure - stop drinking milk or drinks that contain milk.  2 hours before the procedure - stop drinking clear liquids. Medicines  Ask your health care provider about: ? Changing or stopping your regular medicines. This is especially important if you are taking diabetes medicines or blood thinners. ? Taking medicines such as aspirin and ibuprofen. These medicines can thin your blood. Do not take these medicines before your procedure if your health care provider instructs you not to.  You may be given antibiotic medicine to help prevent infection. General instructions  You may have a urine sample taken to check for infection.  Plan to have someone take you home from the hospital or clinic. What happens during the procedure?   To reduce your risk of infection: ? Your health care team will wash or sanitize their hands. ? Your skin will be washed with soap.  An IV tube will be inserted into one of your veins.  You will be given one of the following: ? A medicine to help you relax (sedative). ? A medicine to make you fall asleep (general anesthetic). ? A medicine that is injected into your spine to numb the area below and slightly above the injection site (spinal anesthetic).  To lower your risk of infection, you may be given an antibiotic medicine   by an injection or through the IV tube.  The opening from which you urinate (urethra) will be cleaned with a germ-killing solution.  The ureteroscope will be passed through your urethra into your bladder.  A salt-water solution will flow through the ureteroscope to fill your bladder. This will help the health care provider see the openings of your ureters more  clearly.  Then, the ureteroscope will be passed into your ureter. ? If a growth is found, a piece of it may be removed so it can be examined under a microscope (biopsy). ? If a stone is found, it may be removed through the ureteroscope, or the stone may be broken up using a laser, shock waves, or electrical energy. ? In some cases, if the ureter is too small, a tube may be inserted that keeps the ureter open (ureteral stent). The stent may be left in place for 1 or 2 weeks to keep the ureter open, and then the ureteroscopy procedure will be performed.  The scope will be removed, and your bladder will be emptied. The procedure may vary among health care providers and hospitals. What happens after the procedure?  Your blood pressure, heart rate, breathing rate, and blood oxygen level will be monitored until the medicines you were given have worn off.  You may be asked to urinate.  Donot drive for 24 hours if you were given a sedative. This information is not intended to replace advice given to you by your health care provider. Make sure you discuss any questions you have with your health care provider. Document Revised: 10/25/2017 Document Reviewed: 08/24/2016 Elsevier Patient Education  2020 Elsevier Inc.  

## 2019-12-19 LAB — MICROSCOPIC EXAMINATION: WBC, UA: >30 /hpf — AB (ref 0–5)

## 2019-12-19 LAB — SARS CORONAVIRUS 2 (TAT 6-24 HRS): SARS Coronavirus 2: NEGATIVE

## 2019-12-19 LAB — URINALYSIS, COMPLETE
Bilirubin, UA: NEGATIVE
Glucose, UA: NEGATIVE
Ketones, UA: NEGATIVE
Nitrite, UA: NEGATIVE
Specific Gravity, UA: 1.025 (ref 1.005–1.030)
Urobilinogen, Ur: 0.2 mg/dL (ref 0.2–1.0)
pH, UA: 5.5 (ref 5.0–7.5)

## 2019-12-21 ENCOUNTER — Other Ambulatory Visit
Admission: RE | Admit: 2019-12-21 | Discharge: 2019-12-21 | Disposition: A | Discharge status: Home / Self Care | Payer: Medicare Other | Source: Ambulatory Visit | Attending: Urology | Admitting: Urology

## 2019-12-21 ENCOUNTER — Other Ambulatory Visit: Payer: Self-pay

## 2019-12-21 HISTORY — DX: Depression, unspecified: F32.A

## 2019-12-21 HISTORY — DX: Anxiety disorder, unspecified: F41.9

## 2019-12-21 HISTORY — DX: Unspecified cataract: H26.9

## 2019-12-21 HISTORY — DX: Gout, unspecified: M10.9

## 2019-12-21 HISTORY — DX: Personal history of urinary calculi: Z87.442

## 2019-12-21 LAB — CULTURE, URINE COMPREHENSIVE

## 2019-12-21 MED ORDER — CEFAZOLIN SODIUM-DEXTROSE 2-4 GM/100ML-% IV SOLN
2.0000 g | INTRAVENOUS | Status: AC
  Administered 2019-12-22: 2 g via INTRAVENOUS

## 2019-12-21 NOTE — Patient Instructions (Addendum)
Your procedure is scheduled on: Tuesday, January 26 Report to Day Surgery on the 2nd floor of the [facility]. To find out your arrival time, please call [phone] between 1PM - 3PM on: Monday, January 25  REMEMBER: Instructions that are not followed completely may result in serious medical risk, up to and including death; or upon the discretion of your surgeon and anesthesiologist your surgery may need to be rescheduled.  Do not eat food after midnight the night before surgery.  No gum chewing, lozengers or hard candies.  You may however, drink water up to 2 hours before you are scheduled to arrive for your surgery. Do not drink anything within 2 hours of the start of your surgery.  No Alcohol for 24 hours before or after surgery.  No Smoking including e-cigarettes for 24 hours prior to surgery.  No chewable tobacco products for at least 6 hours prior to surgery.  No nicotine patches on the day of surgery.  On the morning of surgery brush your teeth with toothpaste and water, you may rinse your mouth with mouthwash if you wish. Do not swallow any toothpaste or mouthwash.  Notify your doctor if there is any change in your medical condition (cold, fever, infection).  Do not wear jewelry, make-up, hairpins, clips or nail polish.  Do not wear lotions, powders, or perfumes.   Do not shave 48 hours prior to surgery.   Contacts and dentures may not be worn into surgery.  Do not bring valuables to the hospital, including drivers license, insurance or credit cards.  [facility] is not responsible for any belongings or valuables.   TAKE THESE MEDICATIONS THE MORNING OF SURGERY:  1.  Amlodipine 2.  Bupropion 3.  Metoprolol  Stop Metformin 2 days prior to surgery. Last day to take is Saturday, January 23; resume after surgery  Take 1/2 of usual insulin dose the night before surgery and none on the morning of surgery. Only take 26 units Levemir the night prior to surgery.  Stop  Anti-inflammatories (NSAIDS) such as Advil, Aleve, Ibuprofen, Motrin, Naproxen, Naprosyn and Aspirin based products such as Excedrin, Goodys Powder, BC Powder. (May take Tylenol or Acetaminophen if needed.)  Stop ANY OVER THE COUNTER supplements until after surgery. (May continue Vitamin D, Vitamin B, and multivitamin.)  Wear comfortable clothing (specific to your surgery type) to the hospital.  If you are being discharged the day of surgery, you will not be allowed to drive home. You will need a responsible adult to drive you home and stay with you that night.   If you are taking public transportation, you will need to have a responsible adult with you. Please confirm with your physician that it is acceptable to use public transportation.   Please call [phone] if you have any questions about these instructions.

## 2019-12-22 ENCOUNTER — Ambulatory Visit
Admission: RE | Admit: 2019-12-22 | Discharge: 2019-12-22 | Disposition: A | Discharge status: Home / Self Care | Payer: Medicare Other | Attending: Urology | Admitting: Urology

## 2019-12-22 ENCOUNTER — Encounter: Payer: Self-pay | Admitting: Urology

## 2019-12-22 ENCOUNTER — Ambulatory Visit: Payer: Medicare Other | Admitting: Anesthesiology

## 2019-12-22 ENCOUNTER — Encounter
Admission: RE | Disposition: A | Discharge status: Home / Self Care | Payer: Self-pay | Source: Home / Self Care | Attending: Urology

## 2019-12-22 ENCOUNTER — Ambulatory Visit: Payer: Medicare Other

## 2019-12-22 DIAGNOSIS — E785 Hyperlipidemia, unspecified: Secondary | ICD-10-CM | POA: Diagnosis not present

## 2019-12-22 DIAGNOSIS — N3289 Other specified disorders of bladder: Secondary | ICD-10-CM | POA: Insufficient documentation

## 2019-12-22 DIAGNOSIS — N3031 Trigonitis with hematuria: Secondary | ICD-10-CM | POA: Diagnosis not present

## 2019-12-22 DIAGNOSIS — N1330 Unspecified hydronephrosis: Secondary | ICD-10-CM

## 2019-12-22 DIAGNOSIS — Z87891 Personal history of nicotine dependence: Secondary | ICD-10-CM | POA: Insufficient documentation

## 2019-12-22 DIAGNOSIS — N132 Hydronephrosis with renal and ureteral calculous obstruction: Secondary | ICD-10-CM | POA: Diagnosis present

## 2019-12-22 DIAGNOSIS — E119 Type 2 diabetes mellitus without complications: Secondary | ICD-10-CM | POA: Diagnosis not present

## 2019-12-22 DIAGNOSIS — Z794 Long term (current) use of insulin: Secondary | ICD-10-CM | POA: Diagnosis not present

## 2019-12-22 DIAGNOSIS — I10 Essential (primary) hypertension: Secondary | ICD-10-CM

## 2019-12-22 DIAGNOSIS — Z87442 Personal history of urinary calculi: Secondary | ICD-10-CM | POA: Diagnosis not present

## 2019-12-22 DIAGNOSIS — Z91041 Radiographic dye allergy status: Secondary | ICD-10-CM | POA: Insufficient documentation

## 2019-12-22 DIAGNOSIS — N1339 Other hydronephrosis: Secondary | ICD-10-CM

## 2019-12-22 DIAGNOSIS — Z7982 Long term (current) use of aspirin: Secondary | ICD-10-CM | POA: Diagnosis not present

## 2019-12-22 DIAGNOSIS — F419 Anxiety disorder, unspecified: Secondary | ICD-10-CM | POA: Insufficient documentation

## 2019-12-22 DIAGNOSIS — N200 Calculus of kidney: Secondary | ICD-10-CM

## 2019-12-22 DIAGNOSIS — F329 Major depressive disorder, single episode, unspecified: Secondary | ICD-10-CM | POA: Diagnosis not present

## 2019-12-22 DIAGNOSIS — R3129 Other microscopic hematuria: Secondary | ICD-10-CM

## 2019-12-22 HISTORY — PX: CYSTOSCOPY/URETEROSCOPY/HOLMIUM LASER/STENT PLACEMENT: SHX6546

## 2019-12-22 HISTORY — PX: CYSTOSCOPY W/ RETROGRADES: SHX1426

## 2019-12-22 HISTORY — PX: CYSTOSCOPY WITH STENT PLACEMENT: SHX5790

## 2019-12-22 HISTORY — PX: CYSTOSCOPY WITH URETEROSCOPY: SHX5123

## 2019-12-22 HISTORY — PX: CYSTOSCOPY WITH BIOPSY: SHX5122

## 2019-12-22 LAB — BASIC METABOLIC PANEL
Anion gap: 12 (ref 5–15)
BUN: 45 mg/dL — ABNORMAL HIGH (ref 8–23)
CO2: 20 mmol/L — ABNORMAL LOW (ref 22–32)
Calcium: 9.5 mg/dL (ref 8.9–10.3)
Chloride: 104 mmol/L (ref 98–111)
Creatinine, Ser: 1.97 mg/dL — ABNORMAL HIGH (ref 0.44–1.00)
GFR calc Af Amer: 29 mL/min — ABNORMAL LOW (ref >60)
GFR calc non Af Amer: 25 mL/min — ABNORMAL LOW (ref >60)
Glucose, Bld: 228 mg/dL — ABNORMAL HIGH (ref 70–99)
Potassium: 4.5 mmol/L (ref 3.5–5.1)
Sodium: 136 mmol/L (ref 135–145)

## 2019-12-22 LAB — GLUCOSE, CAPILLARY
Glucose-Capillary: 244 mg/dL — ABNORMAL HIGH (ref 70–99)
Glucose-Capillary: 162 mg/dL — ABNORMAL HIGH (ref 70–99)

## 2019-12-22 SURGERY — CYSTOSCOPY, WITH BIOPSY
Anesthesia: General | Laterality: Right

## 2019-12-22 MED ORDER — PROPOFOL 10 MG/ML IV BOLUS
INTRAVENOUS | Status: AC
  Filled 2019-12-22: qty 20

## 2019-12-22 MED ORDER — HYDROCODONE-ACETAMINOPHEN 5-325 MG PO TABS: 1.0000 | ORAL_TABLET | ORAL | 0 refills | Status: DC | PRN

## 2019-12-22 MED ORDER — DIPHENHYDRAMINE HCL 50 MG/ML IJ SOLN
25.0000 mg | Freq: Once | INTRAMUSCULAR | Status: AC
  Administered 2019-12-22: 25 mg via INTRAVENOUS

## 2019-12-22 MED ORDER — OXYBUTYNIN CHLORIDE 5 MG PO TABS: ORAL_TABLET | ORAL | 0 refills | Status: DC

## 2019-12-22 MED ORDER — OXYCODONE HCL 5 MG/5ML PO SOLN: 5.0000 mg | Freq: Once | ORAL | Status: AC | PRN

## 2019-12-22 MED ORDER — ONDANSETRON HCL 4 MG/2ML IJ SOLN: 4.0000 mg | Freq: Once | INTRAMUSCULAR | Status: DC | PRN

## 2019-12-22 MED ORDER — SUGAMMADEX SODIUM 200 MG/2ML IV SOLN
INTRAVENOUS | Status: DC | PRN
  Administered 2019-12-22: 208.6 mg via INTRAVENOUS

## 2019-12-22 MED ORDER — OXYCODONE HCL 5 MG PO TABS
ORAL_TABLET | ORAL | Status: AC
  Filled 2019-12-22: qty 1

## 2019-12-22 MED ORDER — FAMOTIDINE 20 MG PO TABS
20.0000 mg | ORAL_TABLET | Freq: Once | ORAL | Status: AC
  Administered 2019-12-22: 20 mg via ORAL

## 2019-12-22 MED ORDER — ACETAMINOPHEN 10 MG/ML IV SOLN
INTRAVENOUS | Status: AC
  Filled 2019-12-22: qty 100

## 2019-12-22 MED ORDER — FAMOTIDINE 20 MG PO TABS
ORAL_TABLET | ORAL | Status: AC
  Filled 2019-12-22: qty 1

## 2019-12-22 MED ORDER — FENTANYL CITRATE (PF) 100 MCG/2ML IJ SOLN: 25.0000 ug | INTRAMUSCULAR | Status: DC | PRN

## 2019-12-22 MED ORDER — SUCCINYLCHOLINE CHLORIDE 20 MG/ML IJ SOLN
INTRAMUSCULAR | Status: AC
  Filled 2019-12-22: qty 1

## 2019-12-22 MED ORDER — ACETAMINOPHEN 10 MG/ML IV SOLN: 1000.0000 mg | Freq: Once | INTRAVENOUS | Status: DC | PRN

## 2019-12-22 MED ORDER — INSULIN ASPART 100 UNIT/ML ~~LOC~~ SOLN
8.0000 [IU] | Freq: Once | SUBCUTANEOUS | Status: AC
  Administered 2019-12-22: 8 [IU] via SUBCUTANEOUS

## 2019-12-22 MED ORDER — ONDANSETRON HCL 4 MG/2ML IJ SOLN
INTRAMUSCULAR | Status: AC
  Filled 2019-12-22: qty 2

## 2019-12-22 MED ORDER — SODIUM CHLORIDE 0.9 % IV SOLN: INTRAVENOUS | Status: DC

## 2019-12-22 MED ORDER — OXYBUTYNIN CHLORIDE 5 MG PO TABS: ORAL_TABLET | ORAL | 1 refills | Status: DC

## 2019-12-22 MED ORDER — LIDOCAINE HCL (CARDIAC) PF 100 MG/5ML IV SOSY
PREFILLED_SYRINGE | INTRAVENOUS | Status: DC | PRN
  Administered 2019-12-22: 100 mg via INTRAVENOUS

## 2019-12-22 MED ORDER — ROCURONIUM BROMIDE 100 MG/10ML IV SOLN
INTRAVENOUS | Status: DC | PRN
  Administered 2019-12-22: 10 mg via INTRAVENOUS
  Administered 2019-12-22: 25 mg via INTRAVENOUS
  Administered 2019-12-22: 5 mg via INTRAVENOUS

## 2019-12-22 MED ORDER — INSULIN ASPART 100 UNIT/ML ~~LOC~~ SOLN
SUBCUTANEOUS | Status: AC
  Filled 2019-12-22: qty 1

## 2019-12-22 MED ORDER — ACETAMINOPHEN 10 MG/ML IV SOLN
INTRAVENOUS | Status: DC | PRN
  Administered 2019-12-22: 1000 mg via INTRAVENOUS

## 2019-12-22 MED ORDER — DIPHENHYDRAMINE HCL 50 MG/ML IJ SOLN
INTRAMUSCULAR | Status: AC
  Filled 2019-12-22: qty 1

## 2019-12-22 MED ORDER — CEFAZOLIN SODIUM-DEXTROSE 2-4 GM/100ML-% IV SOLN
INTRAVENOUS | Status: AC
  Filled 2019-12-22: qty 100

## 2019-12-22 MED ORDER — OXYCODONE HCL 5 MG PO TABS
5.0000 mg | ORAL_TABLET | Freq: Once | ORAL | Status: AC | PRN
  Administered 2019-12-22: 5 mg via ORAL

## 2019-12-22 MED ORDER — IOHEXOL 180 MG/ML  SOLN
INTRAMUSCULAR | Status: DC | PRN
  Administered 2019-12-22 (×2): 20 mL

## 2019-12-22 MED ORDER — SUCCINYLCHOLINE CHLORIDE 20 MG/ML IJ SOLN
INTRAMUSCULAR | Status: DC | PRN
  Administered 2019-12-22: 100 mg via INTRAVENOUS

## 2019-12-22 MED ORDER — FENTANYL CITRATE (PF) 100 MCG/2ML IJ SOLN
INTRAMUSCULAR | Status: DC | PRN
  Administered 2019-12-22: 50 ug via INTRAVENOUS

## 2019-12-22 MED ORDER — DEXAMETHASONE SODIUM PHOSPHATE 10 MG/ML IJ SOLN
INTRAMUSCULAR | Status: DC | PRN
  Administered 2019-12-22: 10 mg via INTRAVENOUS

## 2019-12-22 MED ORDER — FENTANYL CITRATE (PF) 100 MCG/2ML IJ SOLN
INTRAMUSCULAR | Status: AC
  Filled 2019-12-22: qty 2

## 2019-12-22 MED ORDER — ROCURONIUM BROMIDE 50 MG/5ML IV SOLN
INTRAVENOUS | Status: AC
  Filled 2019-12-22: qty 1

## 2019-12-22 MED ORDER — MIDAZOLAM HCL 2 MG/2ML IJ SOLN
INTRAMUSCULAR | Status: AC
  Filled 2019-12-22: qty 2

## 2019-12-22 MED ORDER — DEXAMETHASONE SODIUM PHOSPHATE 10 MG/ML IJ SOLN
INTRAMUSCULAR | Status: AC
  Filled 2019-12-22: qty 1

## 2019-12-22 MED ORDER — LIDOCAINE HCL (PF) 2 % IJ SOLN
INTRAMUSCULAR | Status: AC
  Filled 2019-12-22: qty 5

## 2019-12-22 MED ORDER — PROPOFOL 10 MG/ML IV BOLUS
INTRAVENOUS | Status: DC | PRN
  Administered 2019-12-22: 180 mg via INTRAVENOUS

## 2019-12-22 SURGICAL SUPPLY — 38 items
BAG DRAIN CYSTO-URO LG1000N (MISCELLANEOUS) ×4 IMPLANT
BASKET ZERO TIP 1.9FR (BASKET) IMPLANT
BRUSH SCRUB EZ  4% CHG (MISCELLANEOUS) ×1
BRUSH SCRUB EZ 1% IODOPHOR (MISCELLANEOUS) ×4 IMPLANT
BRUSH SCRUB EZ 4% CHG (MISCELLANEOUS) ×3 IMPLANT
CATH URETL 5X70 OPEN END (CATHETERS) ×4 IMPLANT
CNTNR SPEC 2.5X3XGRAD LEK (MISCELLANEOUS)
CONT SPEC 4OZ STER OR WHT (MISCELLANEOUS)
CONTAINER SPEC 2.5X3XGRAD LEK (MISCELLANEOUS) IMPLANT
DRAPE UTILITY 15X26 TOWEL STRL (DRAPES) ×4 IMPLANT
DRSG TELFA 4X3 1S NADH ST (GAUZE/BANDAGES/DRESSINGS) ×4 IMPLANT
ELECT REM PT RETURN 9FT ADLT (ELECTROSURGICAL) ×4
ELECTRODE REM PT RTRN 9FT ADLT (ELECTROSURGICAL) ×3 IMPLANT
FIBER LASER TRAC TIP (UROLOGICAL SUPPLIES) IMPLANT
GLOVE BIO SURGEON STRL SZ8 (GLOVE) ×4 IMPLANT
GOWN STRL REUS W/ TWL LRG LVL3 (GOWN DISPOSABLE) ×6 IMPLANT
GOWN STRL REUS W/ TWL XL LVL3 (GOWN DISPOSABLE) ×3 IMPLANT
GOWN STRL REUS W/TWL LRG LVL3 (GOWN DISPOSABLE) ×2
GOWN STRL REUS W/TWL XL LVL3 (GOWN DISPOSABLE) ×5 IMPLANT
GUIDEWIRE STR DUAL SENSOR (WIRE) ×4 IMPLANT
INFUSOR MANOMETER BAG 3000ML (MISCELLANEOUS) ×4 IMPLANT
INTRODUCER DILATOR DOUBLE (INTRODUCER) IMPLANT
KIT TURNOVER CYSTO (KITS) ×4 IMPLANT
NDL SAFETY ECLIPSE 18X1.5 (NEEDLE) ×3 IMPLANT
NEEDLE HYPO 18GX1.5 SHARP (NEEDLE) ×1
PACK CYSTO AR (MISCELLANEOUS) ×4 IMPLANT
SCOPE LITHOVU DISP 9.5FR 7.7FR (UROLOGICAL SUPPLIES) ×3 IMPLANT
SCOPE LITHOVUE DISPOSABLE (UROLOGICAL SUPPLIES) ×1
SET CYSTO W/LG BORE CLAMP LF (SET/KITS/TRAYS/PACK) ×4 IMPLANT
SHEATH URETERAL 12FRX35CM (MISCELLANEOUS) IMPLANT
SOL .9 NS 3000ML IRR  AL (IV SOLUTION) ×1
SOL .9 NS 3000ML IRR UROMATIC (IV SOLUTION) ×3 IMPLANT
STENT URET 6FRX24 CONTOUR (STENTS) ×4 IMPLANT
STENT URET 6FRX26 CONTOUR (STENTS) IMPLANT
SURGILUBE 2OZ TUBE FLIPTOP (MISCELLANEOUS) ×4 IMPLANT
VALVE UROSEAL ADJ ENDO (VALVE) ×4 IMPLANT
WATER STERILE IRR 1000ML POUR (IV SOLUTION) ×4 IMPLANT
WATER STERILE IRR 3000ML UROMA (IV SOLUTION) ×4 IMPLANT

## 2019-12-22 NOTE — Interval H&P Note (Signed)
History and Physical Interval Note:  12/22/2019 9:59 AM  [name]  has presented today for surgery, with the diagnosis of microhematuria, right hydronephrosis, left upper pole renal stone.  The various methods of treatment have been discussed with the patient and family. After consideration of risks, benefits and other options for treatment, the patient has consented to  Procedure(s): CYSTOSCOPY WITH bladder BIOPSY (N/A) CYSTOSCOPY WITH RETROGRADE PYELOGRAM (Bilateral) CYSTOSCOPY WITH URETEROSCOPY (Bilateral) CYSTOSCOPY WITH STENT PLACEMENT (Bilateral) CYSTOSCOPY/URETEROSCOPY/HOLMIUM LASER/STENT PLACEMENT (Bilateral) as a surgical intervention.  The patient's history has been reviewed, patient examined, no change in status, stable for surgery.  I have reviewed the patient's chart and labs.  Questions were answered to the patient's satisfaction.     [place]

## 2019-12-22 NOTE — Anesthesia Preprocedure Evaluation (Signed)
Anesthesia Evaluation  Patient identified by MRN, date of birth, ID band Patient awake    Reviewed: Allergy & Precautions, NPO status , Patient's Chart, lab work & pertinent test results  History of Anesthesia Complications Negative for: history of anesthetic complications  Airway Mallampati: II  TM Distance: >3 FB Neck ROM: Full    Dental no notable dental hx. (+) Teeth Intact, Dental Advisory Given   Pulmonary neg pulmonary ROS, neg sleep apnea, neg COPD, Patient abstained from smoking.Not current smoker, former smoker,    Pulmonary exam normal breath sounds clear to auscultation       Cardiovascular Exercise Tolerance: Good METShypertension, Pt. on medications (-) CAD and (-) Past MI (-) dysrhythmias  Rhythm:Regular Rate:Normal - Systolic murmurs    Neuro/Psych PSYCHIATRIC DISORDERS Anxiety Depression negative neurological ROS  negative psych ROS   GI/Hepatic neg GERD  ,(+)     (-) substance abuse  ,   Endo/Other  diabetes, Insulin Dependent  Renal/GU CRFRenal diseasenegative Renal ROS     Musculoskeletal   Abdominal (+) + obese,   Peds  Hematology   Anesthesia Other Findings Past Medical History: No date: Anxiety No date: Cataract No date: Depression No date: Diabetes mellitus without complication (HCC) No date: Gout No date: History of kidney stones No date: Hyperlipidemia No date: Hypertension No date: Vertigo  Reproductive/Obstetrics                             Anesthesia Physical Anesthesia Plan  ASA: II  Anesthesia Plan: General   Post-op Pain Management:    Induction: Intravenous  PONV Risk Score and Plan: 4 or greater and Ondansetron and Dexamethasone  Airway Management Planned: Oral ETT and LMA  Additional Equipment: None  Intra-op Plan:   Post-operative Plan: Extubation in OR  Informed Consent: I have reviewed the patients History and Physical, chart,  labs and discussed the procedure including the risks, benefits and alternatives for the proposed anesthesia with the patient or authorized representative who has indicated his/her understanding and acceptance.     Dental advisory given  Plan Discussed with: CRNA and Surgeon  Anesthesia Plan Comments: (Discussed risks of anesthesia with patient, including PONV, sore throat, lip/dental damage. Rare risks discussed as well, such as cardiorespiratory sequelae. Patient understands.)        Anesthesia Quick Evaluation

## 2019-12-22 NOTE — Discharge Instructions (Addendum)
DISCHARGE INSTRUCTIONS FOR KIDNEY STONE/URETERAL STENT   MEDICATIONS:  1. Resume all your other meds from home.  2.  AZO (over-the-counter) can help with the burning/stinging when you urinate. 3.  Hydrocodone is for moderate/severe pain, Rx was sent to your pharmacy 4.  Oxybutynin is for bladder spasm/irritation, Rx  ACTIVITY:  1. May resume regular activities in 24 hours. 2. No driving while on narcotic pain medications  3. Drink plenty of water  4. Continue to walk at home - you can still get blood clots when you are at home, so keep active, but don't over do it.  5. May return to work/school tomorrow or when you feel ready   BATHING:  1. You can shower.   SIGNS/SYMPTOMS TO CALL:  Please call us if you have a fever greater than 101.5, uncontrolled nausea/vomiting, uncontrolled pain, dizziness, unable to urinate, excessively bloody urine, chest pain, shortness of breath, leg swelling, leg pain, or any other concerns or questions.   Urinary frequency, urgency, bladder spasm and blood in urine are common symptoms.  You can reach [place] at [phone].   FOLLOW-UP:  1. You we will be contacted regarding office follow-up.   AMBULATORY SURGERY  DISCHARGE INSTRUCTIONS   1) The drugs that you were given will stay in your system until tomorrow so for the next 24 hours you should not:  A) Drive an automobile B) Make any legal decisions C) Drink any alcoholic beverage   2) You may resume regular meals tomorrow.  Today it is better to start with liquids and gradually work up to solid foods.  You may eat anything you prefer, but it is better to start with liquids, then soup and crackers, and gradually work up to solid foods.   3) Please notify your doctor immediately if you have any unusual bleeding, trouble breathing, redness and pain at the surgery site, drainage, fever, or pain not relieved by medication.    4) Additional Instructions:        Please contact your  physician with any problems or Same Day Surgery at [phone], Monday through Friday 6 am to 4 pm, or [place] at [facility] number at [phone].

## 2019-12-22 NOTE — Anesthesia Procedure Notes (Signed)
Procedure Name: Intubation Date/Time: 12/22/2019 10:14 AM Performed by: Johnna Acosta, CRNA Pre-anesthesia Checklist: Patient identified, Emergency Drugs available, Suction available, Patient being monitored and Timeout performed Patient Re-evaluated:Patient Re-evaluated prior to induction Oxygen Delivery Method: Circle system utilized Preoxygenation: Pre-oxygenation with 100% oxygen Induction Type: IV induction Ventilation: Mask ventilation with difficulty and Oral airway inserted - appropriate to patient size Laryngoscope Size: McGraph and 3 Grade View: Grade II Tube type: Oral Tube size: 7.0 mm Airway Equipment and Method: Stylet,  Video-laryngoscopy and Oral airway Placement Confirmation: ETT inserted through vocal cords under direct vision,  positive ETCO2 and breath sounds checked- equal and bilateral Secured at: 22 cm Tube secured with: Tape Dental Injury: Teeth and Oropharynx as per pre-operative assessment  Difficulty Due To: Difficulty was anticipated, Difficult Airway- due to large tongue and Difficult Airway- due to reduced neck mobility

## 2019-12-22 NOTE — Op Note (Signed)
Preoperative diagnosis:  1. Bladder erythema  2. Right hydronephrosis 3. Right nephrolithiasis 4. Left nephrolithiasis  Postoperative diagnosis:  1. Bladder erythema 2. Right hydronephrosis 3. Extensive sediment right collecting system  Procedure: 1. Cystoscopy with bladder biopsy 2. Right ureteroscopy 3. Right ureteral stent placement (6FR/24 cm)  Surgeon: Abbie Sons, MD  Anesthesia: General  Complications: None  Intraoperative findings:  - Erythematous bladder mucosa primarily posterior/lateral walls  -No stone identified in the right collecting system however extensive collection of sediment noted  -Right retrograde pyelogram with moderate hydronephrosis/hydroureter which appeared to be nonobstructive  EBL: Minimal  Specimens: Sediment aspirated from the right collecting system for urine culture and cytology  Indication: Heather Lopez is a 74 y.o. female seen by Dr. Junious Silk for chronic dysuria, frequency and urgency.  Urinalysis showed microhematuria and cultures grew mixed flora.  Office cystoscopy performed by Dr. Junious Silk remarkable for suboptimal visualization secondary to urine sediment.  The mucosa was noted to be erythematous.  Noncontrast CT remarkable for mild bladder wall thickening, moderate right hydronephrosis and hydroureter to the level of the bladder without obstructing calculus and bilateral nephrolithiasis.  After reviewing the management options for treatment, he elected to proceed with the above surgical procedure(s). We have discussed the potential benefits and risks of the procedure, side effects of the proposed treatment, the likelihood of the patient achieving the goals of the procedure, and any potential problems that might occur during the procedure or recuperation. Informed consent has been obtained.  Description of procedure:  The patient was taken to the operating room and general anesthesia was induced.  The patient was placed in the dorsal  lithotomy position, prepped and draped in the usual sterile fashion, and preoperative antibiotics were administered. A preoperative time-out was performed.   A 21 French cystoscope with obturator was lubricated and passed per urethra.  The telescope was placed into the sheath and panendoscopy was performed.  Visualization was suboptimal secondary to sediment within the bladder and fill/drain was required several times for adequate visualization.  The ureteral orifices were normal-appearing.  The right ureteral orifice was patulous.  Clear efflux was noted bilaterally.  The bladder mucosa was edematous and erythematous primarily on the posterior and lateral walls.  Cold cup biopsies x2 were performed of the posterior and left lateral wall erythema which was more prominent.  Hemostasis was obtained with a Bugbee electrode.  A 0.038 Sensor wire was then placed through a 5 [place] open-ended ureteral catheter and advanced through the cystoscope cystoscope and into the right ureteral orifice.  The guidewire was removed and a right retrograde pyelogram was performed with findings as described above.  The guidewire was replaced through the ureteral catheter however was curling in the proximal ureter.  The wire was left in place and a 4.5 French semirigid ureteroscope was advanced up the ureter.  The area where the wire would not advance was patulous and the ureter was tortuous in this area.  The guidewire was advanced through the ureteroscope under direct vision without difficulty.  The semirigid ureteroscope was removed and a disposable [employer] flexible ureteroscope was passed per urethra.  The right ureteral orifice was easily engaged and the ureteroscope was advanced proximally into the renal pelvis. A significant amount of sudden was noted free-floating and layered on the mucosa.  Approximately 40 cc was aspirated with a syringe and sent for urine culture and cytology.  Contrast was instilled  through the ureteroscope and all calyces and renal pelvis were examined.  A definite stone was not visualized with significant sediment encountered in the calyces.  With increase irrigant flow a definite stone was again not seen.  The ureteroscope was slowly withdrawn and no ureteral mucosal abnormalities or calculi were identified.  A 6 French/24 cm Contour ureteral stent was placed over the guidewire under fluoroscopic guidance with good positioning noted proximally and distally under fluoroscopy.  Since that Heather Lopez has not had culture specific documented urinary tract infections it was elected not to perform left ureteroscopy at this time since Heather Lopez did not have hydronephrosis and it was felt bilateral ureteral stents with the appearance of her bladder would be difficult for her to tolerate.  After anesthetic reversal Heather Lopez was transported to the PACU in stable condition.  Plan: We will await her bladder biopsy, culture and cytology results.  Stent to remain indwelling approximately 7 days   Abbie Sons, M.D.

## 2019-12-22 NOTE — Transfer of Care (Signed)
Immediate Anesthesia Transfer of Care Note  Patient: Heather Lopez  Procedure(s) Performed: Procedure(s): CYSTOSCOPY WITH bladder BIOPSY (N/A) CYSTOSCOPY WITH RETROGRADE PYELOGRAM (Bilateral) CYSTOSCOPY WITH URETEROSCOPY (Bilateral) CYSTOSCOPY WITH STENT PLACEMENT (Right) CYSTOSCOPY/URETEROSCOPY/HOLMIUM LASER/STENT PLACEMENT (Right)  Patient Location: PACU  Anesthesia Type:General  Level of Consciousness: sedated  Airway & Oxygen Therapy: Patient Spontanous Breathing and Patient connected to face mask oxygen  Post-op Assessment: Report given to RN and Post -op Vital signs reviewed and stable  Post vital signs: Reviewed and stable  Last Vitals:  Vitals:   12/22/19 0736 12/22/19 1138  BP: (!) 152/71 122/64  Pulse: 79 66  Resp: 14 20  Temp: 37.3 C 36.7 C  SpO2: 18% 59%    Complications: No apparent anesthesia complications

## 2019-12-23 LAB — URINE CULTURE: Culture: NO GROWTH

## 2019-12-23 LAB — CYTOLOGY - NON PAP

## 2019-12-23 LAB — SURGICAL PATHOLOGY

## 2019-12-24 NOTE — Anesthesia Postprocedure Evaluation (Signed)
Anesthesia Post Note  Patient: Heather Lopez  Procedure(s) Performed: CYSTOSCOPY WITH bladder BIOPSY (N/A ) CYSTOSCOPY WITH RETROGRADE PYELOGRAM (Bilateral ) CYSTOSCOPY WITH URETEROSCOPY (Bilateral ) CYSTOSCOPY WITH STENT PLACEMENT (Right ) CYSTOSCOPY/URETEROSCOPY/HOLMIUM LASER/STENT PLACEMENT (Right )  Patient location during evaluation: PACU Anesthesia Type: General Level of consciousness: awake and alert Pain management: pain level controlled Vital Signs Assessment: post-procedure vital signs reviewed and stable Respiratory status: spontaneous breathing, nonlabored ventilation and respiratory function stable Cardiovascular status: blood pressure returned to baseline and stable Postop Assessment: no apparent nausea or vomiting Anesthetic complications: no     Last Vitals:  Vitals:   12/22/19 1354 12/22/19 1430  BP:    Pulse: 68 73  Resp: 18 18  Temp: 36.8 C   SpO2: 93% 92%    Last Pain:  Vitals:   12/23/19 0904  TempSrc:   PainSc: [place]

## 2019-12-25 ENCOUNTER — Telehealth: Payer: Self-pay

## 2019-12-25 NOTE — Telephone Encounter (Signed)
Patient called stating she is having a lot of fatigue post op and trouble moving due to fatigue. She denies fever, chills, nausea, vomiting, and no pain or discomfort from stent. Patient was encouraged to increase water intake and to try Gatorade for electrolytes. She states she has a lack of appetite . These symptoms are most likely due to anesthesia and should get better with time. If her symptoms do not progressively get better she is to call back

## 2019-12-27 NOTE — Telephone Encounter (Signed)
Wednesday is fine

## 2019-12-30 ENCOUNTER — Other Ambulatory Visit: Payer: Self-pay

## 2019-12-30 ENCOUNTER — Ambulatory Visit (INDEPENDENT_AMBULATORY_CARE_PROVIDER_SITE_OTHER): Payer: Medicare Other | Admitting: Urology

## 2019-12-30 ENCOUNTER — Encounter: Payer: Self-pay | Admitting: Urology

## 2019-12-30 VITALS — BP 133/78 | HR 87 | Ht 60.0 in | Wt 230.0 lb

## 2019-12-30 DIAGNOSIS — N1339 Other hydronephrosis: Secondary | ICD-10-CM

## 2019-12-30 NOTE — Progress Notes (Signed)
Indications: Patient is 74 y.o.,  who recently underwent cystoscopy with bladder biopsy and right retrograde pyelogram/right ureteroscopy.  Intraoperative findings remarkable for bladder erythema/edema and significant volume of sediment in the collecting system without stone.  Urine collected from the renal pelvis for cytology showed inflammatory cells and urine culture was negative.  She had nonobstructive right hydronephrosis.  The patient is presenting today for stent removal.  Procedure:  Flexible Cystoscopy with stent removal (82956)  Timeout was performed and the correct patient, procedure and participants were identified.    Description:  The patient was prepped and draped in the usual sterile fashion. Flexible cystosopy was performed.  Visualization was difficult secondary to sediment within the bladder.  The stent was visualized, grasped, and removed intact without difficulty. The patient tolerated the procedure well.  He is presently on antibiotic therapy.  Complications:  None  Plan: Bladder biopsy was remarkable for inflammation.  The etiology of her findings indeterminate at this time.  Will discuss with colleagues and do a literature search.  Instructed to call for fever/flank pain post stent removal   John Giovanni, MD

## 2019-12-31 LAB — URINALYSIS, COMPLETE
Bilirubin, UA: NEGATIVE
Glucose, UA: NEGATIVE
Ketones, UA: NEGATIVE
Nitrite, UA: NEGATIVE
Specific Gravity, UA: 1.02 (ref 1.005–1.030)
Urobilinogen, Ur: 0.2 mg/dL (ref 0.2–1.0)
pH, UA: 6.5 (ref 5.0–7.5)

## 2019-12-31 LAB — MICROSCOPIC EXAMINATION
Epithelial Cells (non renal): NONE SEEN /hpf (ref 0–10)
RBC, Urine: NONE SEEN /hpf (ref 0–2)
WBC, UA: >30 /hpf — AB (ref 0–5)

## 2021-06-26 DIAGNOSIS — N136 Pyonephrosis: Secondary | ICD-10-CM

## 2021-06-26 DIAGNOSIS — N201 Calculus of ureter: Secondary | ICD-10-CM

## 2021-06-26 HISTORY — DX: Calculus of ureter: N20.1

## 2021-06-26 HISTORY — DX: Pyonephrosis: N13.6

## 2021-06-27 ENCOUNTER — Inpatient Hospital Stay
Admission: EM | Admit: 2021-06-27 | Discharge: 2021-08-03 | DRG: 853 | Disposition: A | Discharge status: Other Acute Inpatient Hospital | Payer: Medicare Other | Attending: Internal Medicine | Admitting: Internal Medicine

## 2021-06-27 ENCOUNTER — Inpatient Hospital Stay: Payer: Medicare Other

## 2021-06-27 ENCOUNTER — Other Ambulatory Visit: Payer: Self-pay

## 2021-06-27 ENCOUNTER — Emergency Department: Payer: Medicare Other

## 2021-06-27 ENCOUNTER — Inpatient Hospital Stay (HOSPITAL_COMMUNITY)
Admit: 2021-06-27 | Discharge: 2021-06-27 | Disposition: A | Discharge status: Home / Self Care | Payer: Medicare Other | Attending: Nephrology | Admitting: Nephrology

## 2021-06-27 DIAGNOSIS — D509 Iron deficiency anemia, unspecified: Secondary | ICD-10-CM | POA: Diagnosis present

## 2021-06-27 DIAGNOSIS — K265 Chronic or unspecified duodenal ulcer with perforation: Secondary | ICD-10-CM

## 2021-06-27 DIAGNOSIS — J9601 Acute respiratory failure with hypoxia: Secondary | ICD-10-CM | POA: Diagnosis not present

## 2021-06-27 DIAGNOSIS — IMO0002 Reserved for concepts with insufficient information to code with codable children: Secondary | ICD-10-CM

## 2021-06-27 DIAGNOSIS — N170 Acute kidney failure with tubular necrosis: Secondary | ICD-10-CM | POA: Diagnosis not present

## 2021-06-27 DIAGNOSIS — N186 End stage renal disease: Secondary | ICD-10-CM | POA: Diagnosis present

## 2021-06-27 DIAGNOSIS — I248 Other forms of acute ischemic heart disease: Secondary | ICD-10-CM | POA: Diagnosis not present

## 2021-06-27 DIAGNOSIS — N134 Hydroureter: Secondary | ICD-10-CM | POA: Diagnosis not present

## 2021-06-27 DIAGNOSIS — J9602 Acute respiratory failure with hypercapnia: Secondary | ICD-10-CM | POA: Diagnosis not present

## 2021-06-27 DIAGNOSIS — E876 Hypokalemia: Secondary | ICD-10-CM

## 2021-06-27 DIAGNOSIS — N132 Hydronephrosis with renal and ureteral calculous obstruction: Secondary | ICD-10-CM | POA: Diagnosis not present

## 2021-06-27 DIAGNOSIS — A419 Sepsis, unspecified organism: Secondary | ICD-10-CM | POA: Diagnosis not present

## 2021-06-27 DIAGNOSIS — Z794 Long term (current) use of insulin: Secondary | ICD-10-CM | POA: Diagnosis not present

## 2021-06-27 DIAGNOSIS — J690 Pneumonitis due to inhalation of food and vomit: Secondary | ICD-10-CM

## 2021-06-27 DIAGNOSIS — N178 Other acute kidney failure: Secondary | ICD-10-CM | POA: Diagnosis not present

## 2021-06-27 DIAGNOSIS — R109 Unspecified abdominal pain: Secondary | ICD-10-CM

## 2021-06-27 DIAGNOSIS — N200 Calculus of kidney: Secondary | ICD-10-CM | POA: Diagnosis not present

## 2021-06-27 DIAGNOSIS — Z992 Dependence on renal dialysis: Secondary | ICD-10-CM | POA: Diagnosis not present

## 2021-06-27 DIAGNOSIS — N184 Chronic kidney disease, stage 4 (severe): Secondary | ICD-10-CM | POA: Diagnosis not present

## 2021-06-27 DIAGNOSIS — Z823 Family history of stroke: Secondary | ICD-10-CM

## 2021-06-27 DIAGNOSIS — Z7982 Long term (current) use of aspirin: Secondary | ICD-10-CM

## 2021-06-27 DIAGNOSIS — T8249XA Other complication of vascular dialysis catheter, initial encounter: Secondary | ICD-10-CM | POA: Diagnosis not present

## 2021-06-27 DIAGNOSIS — E1122 Type 2 diabetes mellitus with diabetic chronic kidney disease: Secondary | ICD-10-CM | POA: Diagnosis present

## 2021-06-27 DIAGNOSIS — E860 Dehydration: Secondary | ICD-10-CM | POA: Diagnosis present

## 2021-06-27 DIAGNOSIS — I120 Hypertensive chronic kidney disease with stage 5 chronic kidney disease or end stage renal disease: Secondary | ICD-10-CM | POA: Diagnosis present

## 2021-06-27 DIAGNOSIS — B377 Candidal sepsis: Principal | ICD-10-CM | POA: Diagnosis present

## 2021-06-27 DIAGNOSIS — I472 Ventricular tachycardia: Secondary | ICD-10-CM | POA: Diagnosis not present

## 2021-06-27 DIAGNOSIS — E119 Type 2 diabetes mellitus without complications: Secondary | ICD-10-CM

## 2021-06-27 DIAGNOSIS — R6521 Severe sepsis with septic shock: Secondary | ICD-10-CM | POA: Diagnosis present

## 2021-06-27 DIAGNOSIS — K668 Other specified disorders of peritoneum: Secondary | ICD-10-CM

## 2021-06-27 DIAGNOSIS — N390 Urinary tract infection, site not specified: Secondary | ICD-10-CM | POA: Diagnosis not present

## 2021-06-27 DIAGNOSIS — N179 Acute kidney failure, unspecified: Secondary | ICD-10-CM

## 2021-06-27 DIAGNOSIS — Z978 Presence of other specified devices: Secondary | ICD-10-CM

## 2021-06-27 DIAGNOSIS — Z87891 Personal history of nicotine dependence: Secondary | ICD-10-CM

## 2021-06-27 DIAGNOSIS — K631 Perforation of intestine (nontraumatic): Secondary | ICD-10-CM | POA: Diagnosis not present

## 2021-06-27 DIAGNOSIS — R1310 Dysphagia, unspecified: Secondary | ICD-10-CM | POA: Diagnosis not present

## 2021-06-27 DIAGNOSIS — Y832 Surgical operation with anastomosis, bypass or graft as the cause of abnormal reaction of the patient, or of later complication, without mention of misadventure at the time of the procedure: Secondary | ICD-10-CM | POA: Diagnosis not present

## 2021-06-27 DIAGNOSIS — C642 Malignant neoplasm of left kidney, except renal pelvis: Secondary | ICD-10-CM | POA: Diagnosis present

## 2021-06-27 DIAGNOSIS — J9600 Acute respiratory failure, unspecified whether with hypoxia or hypercapnia: Secondary | ICD-10-CM

## 2021-06-27 DIAGNOSIS — S37019A Minor contusion of unspecified kidney, initial encounter: Secondary | ICD-10-CM

## 2021-06-27 DIAGNOSIS — E871 Hypo-osmolality and hyponatremia: Secondary | ICD-10-CM | POA: Diagnosis present

## 2021-06-27 DIAGNOSIS — K266 Chronic or unspecified duodenal ulcer with both hemorrhage and perforation: Secondary | ICD-10-CM | POA: Diagnosis present

## 2021-06-27 DIAGNOSIS — R9389 Abnormal findings on diagnostic imaging of other specified body structures: Secondary | ICD-10-CM

## 2021-06-27 DIAGNOSIS — N151 Renal and perinephric abscess: Secondary | ICD-10-CM | POA: Diagnosis not present

## 2021-06-27 DIAGNOSIS — E872 Acidosis: Secondary | ICD-10-CM | POA: Diagnosis present

## 2021-06-27 DIAGNOSIS — K659 Peritonitis, unspecified: Secondary | ICD-10-CM | POA: Diagnosis present

## 2021-06-27 DIAGNOSIS — E662 Morbid (severe) obesity with alveolar hypoventilation: Secondary | ICD-10-CM | POA: Diagnosis present

## 2021-06-27 DIAGNOSIS — R5383 Other fatigue: Secondary | ICD-10-CM | POA: Diagnosis not present

## 2021-06-27 DIAGNOSIS — I4892 Unspecified atrial flutter: Secondary | ICD-10-CM

## 2021-06-27 DIAGNOSIS — E875 Hyperkalemia: Secondary | ICD-10-CM | POA: Diagnosis not present

## 2021-06-27 DIAGNOSIS — I82409 Acute embolism and thrombosis of unspecified deep veins of unspecified lower extremity: Secondary | ICD-10-CM

## 2021-06-27 DIAGNOSIS — Z6841 Body Mass Index (BMI) 40.0 and over, adult: Secondary | ICD-10-CM

## 2021-06-27 DIAGNOSIS — Z66 Do not resuscitate: Secondary | ICD-10-CM | POA: Diagnosis not present

## 2021-06-27 DIAGNOSIS — R509 Fever, unspecified: Secondary | ICD-10-CM

## 2021-06-27 DIAGNOSIS — I440 Atrioventricular block, first degree: Secondary | ICD-10-CM | POA: Diagnosis present

## 2021-06-27 DIAGNOSIS — J189 Pneumonia, unspecified organism: Secondary | ICD-10-CM | POA: Diagnosis present

## 2021-06-27 DIAGNOSIS — F419 Anxiety disorder, unspecified: Secondary | ICD-10-CM | POA: Diagnosis present

## 2021-06-27 DIAGNOSIS — Z452 Encounter for adjustment and management of vascular access device: Secondary | ICD-10-CM

## 2021-06-27 DIAGNOSIS — R188 Other ascites: Secondary | ICD-10-CM | POA: Diagnosis present

## 2021-06-27 DIAGNOSIS — D75838 Other thrombocytosis: Secondary | ICD-10-CM | POA: Diagnosis present

## 2021-06-27 DIAGNOSIS — L89152 Pressure ulcer of sacral region, stage 2: Secondary | ICD-10-CM | POA: Diagnosis present

## 2021-06-27 DIAGNOSIS — Z7984 Long term (current) use of oral hypoglycemic drugs: Secondary | ICD-10-CM

## 2021-06-27 DIAGNOSIS — I2510 Atherosclerotic heart disease of native coronary artery without angina pectoris: Secondary | ICD-10-CM | POA: Diagnosis present

## 2021-06-27 DIAGNOSIS — I2489 Other forms of acute ischemic heart disease: Secondary | ICD-10-CM

## 2021-06-27 DIAGNOSIS — I10 Essential (primary) hypertension: Secondary | ICD-10-CM | POA: Diagnosis not present

## 2021-06-27 DIAGNOSIS — Z20822 Contact with and (suspected) exposure to covid-19: Secondary | ICD-10-CM | POA: Diagnosis present

## 2021-06-27 DIAGNOSIS — Z4659 Encounter for fitting and adjustment of other gastrointestinal appliance and device: Secondary | ICD-10-CM

## 2021-06-27 DIAGNOSIS — E11649 Type 2 diabetes mellitus with hypoglycemia without coma: Secondary | ICD-10-CM | POA: Diagnosis not present

## 2021-06-27 DIAGNOSIS — E113293 Type 2 diabetes mellitus with mild nonproliferative diabetic retinopathy without macular edema, bilateral: Secondary | ICD-10-CM | POA: Diagnosis present

## 2021-06-27 DIAGNOSIS — Z7189 Other specified counseling: Secondary | ICD-10-CM | POA: Diagnosis not present

## 2021-06-27 DIAGNOSIS — D638 Anemia in other chronic diseases classified elsewhere: Secondary | ICD-10-CM | POA: Diagnosis not present

## 2021-06-27 DIAGNOSIS — R100 Acute abdomen: Secondary | ICD-10-CM | POA: Diagnosis not present

## 2021-06-27 DIAGNOSIS — Z833 Family history of diabetes mellitus: Secondary | ICD-10-CM

## 2021-06-27 DIAGNOSIS — N189 Chronic kidney disease, unspecified: Secondary | ICD-10-CM | POA: Diagnosis not present

## 2021-06-27 DIAGNOSIS — E870 Hyperosmolality and hypernatremia: Secondary | ICD-10-CM | POA: Diagnosis not present

## 2021-06-27 DIAGNOSIS — E861 Hypovolemia: Secondary | ICD-10-CM | POA: Diagnosis present

## 2021-06-27 DIAGNOSIS — Z7952 Long term (current) use of systemic steroids: Secondary | ICD-10-CM

## 2021-06-27 DIAGNOSIS — Z931 Gastrostomy status: Secondary | ICD-10-CM

## 2021-06-27 DIAGNOSIS — D631 Anemia in chronic kidney disease: Secondary | ICD-10-CM | POA: Diagnosis present

## 2021-06-27 DIAGNOSIS — I4891 Unspecified atrial fibrillation: Secondary | ICD-10-CM | POA: Diagnosis not present

## 2021-06-27 DIAGNOSIS — Z515 Encounter for palliative care: Secondary | ICD-10-CM | POA: Diagnosis not present

## 2021-06-27 DIAGNOSIS — D649 Anemia, unspecified: Secondary | ICD-10-CM

## 2021-06-27 DIAGNOSIS — J188 Other pneumonia, unspecified organism: Secondary | ICD-10-CM | POA: Diagnosis not present

## 2021-06-27 DIAGNOSIS — M109 Gout, unspecified: Secondary | ICD-10-CM | POA: Diagnosis present

## 2021-06-27 DIAGNOSIS — T8149XA Infection following a procedure, other surgical site, initial encounter: Secondary | ICD-10-CM

## 2021-06-27 DIAGNOSIS — Z91041 Radiographic dye allergy status: Secondary | ICD-10-CM

## 2021-06-27 DIAGNOSIS — B379 Candidiasis, unspecified: Secondary | ICD-10-CM | POA: Diagnosis not present

## 2021-06-27 DIAGNOSIS — G928 Other toxic encephalopathy: Secondary | ICD-10-CM | POA: Diagnosis present

## 2021-06-27 DIAGNOSIS — Z801 Family history of malignant neoplasm of trachea, bronchus and lung: Secondary | ICD-10-CM

## 2021-06-27 DIAGNOSIS — E559 Vitamin D deficiency, unspecified: Secondary | ICD-10-CM | POA: Diagnosis present

## 2021-06-27 DIAGNOSIS — B3749 Other urogenital candidiasis: Secondary | ICD-10-CM | POA: Diagnosis not present

## 2021-06-27 DIAGNOSIS — N1330 Unspecified hydronephrosis: Secondary | ICD-10-CM | POA: Diagnosis not present

## 2021-06-27 DIAGNOSIS — N12 Tubulo-interstitial nephritis, not specified as acute or chronic: Secondary | ICD-10-CM | POA: Diagnosis not present

## 2021-06-27 DIAGNOSIS — D62 Acute posthemorrhagic anemia: Secondary | ICD-10-CM | POA: Diagnosis not present

## 2021-06-27 DIAGNOSIS — E1169 Type 2 diabetes mellitus with other specified complication: Secondary | ICD-10-CM | POA: Diagnosis present

## 2021-06-27 DIAGNOSIS — E785 Hyperlipidemia, unspecified: Secondary | ICD-10-CM | POA: Diagnosis present

## 2021-06-27 DIAGNOSIS — R338 Other retention of urine: Secondary | ICD-10-CM | POA: Diagnosis not present

## 2021-06-27 DIAGNOSIS — N201 Calculus of ureter: Secondary | ICD-10-CM | POA: Diagnosis not present

## 2021-06-27 DIAGNOSIS — D6959 Other secondary thrombocytopenia: Secondary | ICD-10-CM | POA: Diagnosis not present

## 2021-06-27 DIAGNOSIS — N136 Pyonephrosis: Secondary | ICD-10-CM | POA: Diagnosis not present

## 2021-06-27 DIAGNOSIS — E1165 Type 2 diabetes mellitus with hyperglycemia: Secondary | ICD-10-CM | POA: Diagnosis not present

## 2021-06-27 DIAGNOSIS — Z9119 Patient's noncompliance with other medical treatment and regimen: Secondary | ICD-10-CM

## 2021-06-27 DIAGNOSIS — Z79899 Other long term (current) drug therapy: Secondary | ICD-10-CM

## 2021-06-27 DIAGNOSIS — L8990 Pressure ulcer of unspecified site, unspecified stage: Secondary | ICD-10-CM | POA: Insufficient documentation

## 2021-06-27 HISTORY — DX: Sepsis, unspecified organism: R65.21

## 2021-06-27 HISTORY — DX: Acute kidney failure, unspecified: N17.9

## 2021-06-27 HISTORY — DX: Chronic or unspecified duodenal ulcer with perforation: K26.5

## 2021-06-27 HISTORY — DX: Sepsis, unspecified organism: A41.9

## 2021-06-27 LAB — BASIC METABOLIC PANEL
Anion gap: 22 — ABNORMAL HIGH (ref 5–15)
Anion gap: 22 — ABNORMAL HIGH (ref 5–15)
BUN: 154 mg/dL — ABNORMAL HIGH (ref 8–23)
BUN: 136 mg/dL — ABNORMAL HIGH (ref 8–23)
CO2: 9 mmol/L — ABNORMAL LOW (ref 22–32)
CO2: 11 mmol/L — ABNORMAL LOW (ref 22–32)
Calcium: 8.3 mg/dL — ABNORMAL LOW (ref 8.9–10.3)
Calcium: 7.9 mg/dL — ABNORMAL LOW (ref 8.9–10.3)
Chloride: 93 mmol/L — ABNORMAL LOW (ref 98–111)
Chloride: 97 mmol/L — ABNORMAL LOW (ref 98–111)
Creatinine, Ser: 9.32 mg/dL — ABNORMAL HIGH (ref 0.44–1.00)
Creatinine, Ser: 9.37 mg/dL — ABNORMAL HIGH (ref 0.44–1.00)
GFR, Estimated: 4 mL/min — ABNORMAL LOW (ref >60)
GFR, Estimated: 4 mL/min — ABNORMAL LOW (ref >60)
Glucose, Bld: 206 mg/dL — ABNORMAL HIGH (ref 70–99)
Glucose, Bld: 187 mg/dL — ABNORMAL HIGH (ref 70–99)
Potassium: 7.5 mmol/L (ref 3.5–5.1)
Potassium: 6.5 mmol/L (ref 3.5–5.1)
Sodium: 124 mmol/L — ABNORMAL LOW (ref 135–145)
Sodium: 130 mmol/L — ABNORMAL LOW (ref 135–145)

## 2021-06-27 LAB — CBC WITH DIFFERENTIAL/PLATELET
Abs Immature Granulocytes: 0.21 10*3/uL — ABNORMAL HIGH (ref 0.00–0.07)
Abs Immature Granulocytes: 0.35 10*3/uL — ABNORMAL HIGH (ref 0.00–0.07)
Basophils Absolute: 0 10*3/uL (ref 0.0–0.1)
Basophils Absolute: 0.1 10*3/uL (ref 0.0–0.1)
Basophils Relative: 0 %
Basophils Relative: 0 %
Eosinophils Absolute: 0 10*3/uL (ref 0.0–0.5)
Eosinophils Absolute: 0 10*3/uL (ref 0.0–0.5)
Eosinophils Relative: 0 %
Eosinophils Relative: 0 %
HCT: 26.4 % — ABNORMAL LOW (ref 36.0–46.0)
HCT: 30.9 % — ABNORMAL LOW (ref 36.0–46.0)
Hemoglobin: 10.4 g/dL — ABNORMAL LOW (ref 12.0–15.0)
Hemoglobin: 8.9 g/dL — ABNORMAL LOW (ref 12.0–15.0)
Immature Granulocytes: 1 %
Immature Granulocytes: 1 %
Lymphocytes Relative: 4 %
Lymphocytes Relative: 7 %
Lymphs Abs: 0.7 10*3/uL (ref 0.7–4.0)
Lymphs Abs: 1.6 10*3/uL (ref 0.7–4.0)
MCH: 28.2 pg (ref 26.0–34.0)
MCH: 28.3 pg (ref 26.0–34.0)
MCHC: 33.7 g/dL (ref 30.0–36.0)
MCHC: 33.7 g/dL (ref 30.0–36.0)
MCV: 83.7 fL (ref 80.0–100.0)
MCV: 83.8 fL (ref 80.0–100.0)
Monocytes Absolute: 0.3 10*3/uL (ref 0.1–1.0)
Monocytes Absolute: 1.3 10*3/uL — ABNORMAL HIGH (ref 0.1–1.0)
Monocytes Relative: 1 %
Monocytes Relative: 5 %
Neutro Abs: 17.9 10*3/uL — ABNORMAL HIGH (ref 1.7–7.7)
Neutro Abs: 21.1 10*3/uL — ABNORMAL HIGH (ref 1.7–7.7)
Neutrophils Relative %: 87 %
Neutrophils Relative %: 94 %
Platelets: 343 10*3/uL (ref 150–400)
Platelets: 483 10*3/uL — ABNORMAL HIGH (ref 150–400)
RBC: 3.15 MIL/uL — ABNORMAL LOW (ref 3.87–5.11)
RBC: 3.69 MIL/uL — ABNORMAL LOW (ref 3.87–5.11)
RDW: 15.1 % (ref 11.5–15.5)
RDW: 15.1 % (ref 11.5–15.5)
WBC: 19.2 10*3/uL — ABNORMAL HIGH (ref 4.0–10.5)
WBC: 24.4 10*3/uL — ABNORMAL HIGH (ref 4.0–10.5)
nRBC: 0.1 % (ref 0.0–0.2)
nRBC: 0.2 % (ref 0.0–0.2)

## 2021-06-27 LAB — RENAL FUNCTION PANEL
Albumin: 2.7 g/dL — ABNORMAL LOW (ref 3.5–5.0)
Albumin: 2.6 g/dL — ABNORMAL LOW (ref 3.5–5.0)
Anion gap: 20 — ABNORMAL HIGH (ref 5–15)
Anion gap: 19 — ABNORMAL HIGH (ref 5–15)
BUN: 194 mg/dL — ABNORMAL HIGH (ref 8–23)
BUN: 170 mg/dL — ABNORMAL HIGH (ref 8–23)
CO2: 11 mmol/L — ABNORMAL LOW (ref 22–32)
CO2: 14 mmol/L — ABNORMAL LOW (ref 22–32)
Calcium: 7.8 mg/dL — ABNORMAL LOW (ref 8.9–10.3)
Calcium: 7.7 mg/dL — ABNORMAL LOW (ref 8.9–10.3)
Chloride: 95 mmol/L — ABNORMAL LOW (ref 98–111)
Chloride: 96 mmol/L — ABNORMAL LOW (ref 98–111)
Creatinine, Ser: 9.44 mg/dL — ABNORMAL HIGH (ref 0.44–1.00)
Creatinine, Ser: 9.14 mg/dL — ABNORMAL HIGH (ref 0.44–1.00)
GFR, Estimated: 4 mL/min — ABNORMAL LOW (ref >60)
GFR, Estimated: 4 mL/min — ABNORMAL LOW (ref >60)
Glucose, Bld: 224 mg/dL — ABNORMAL HIGH (ref 70–99)
Glucose, Bld: 261 mg/dL — ABNORMAL HIGH (ref 70–99)
Phosphorus: 9.5 mg/dL — ABNORMAL HIGH (ref 2.5–4.6)
Phosphorus: 9.1 mg/dL — ABNORMAL HIGH (ref 2.5–4.6)
Potassium: 5.9 mmol/L — ABNORMAL HIGH (ref 3.5–5.1)
Potassium: 5 mmol/L (ref 3.5–5.1)
Sodium: 126 mmol/L — ABNORMAL LOW (ref 135–145)
Sodium: 129 mmol/L — ABNORMAL LOW (ref 135–145)

## 2021-06-27 LAB — BLOOD GAS, VENOUS
Acid-base deficit: 19.8 mmol/L — ABNORMAL HIGH (ref 0.0–2.0)
Bicarbonate: 7.3 mmol/L — ABNORMAL LOW (ref 20.0–28.0)
O2 Saturation: 77.9 %
Patient temperature: 37
pCO2, Ven: 21 mmHg — ABNORMAL LOW (ref 44.0–60.0)
pH, Ven: 7.15 — CL (ref 7.250–7.430)
pO2, Ven: 56 mmHg — ABNORMAL HIGH (ref 32.0–45.0)

## 2021-06-27 LAB — URINALYSIS, COMPLETE (UACMP) WITH MICROSCOPIC
Bilirubin Urine: NEGATIVE
Glucose, UA: 50 mg/dL — AB
Ketones, ur: NEGATIVE mg/dL
Nitrite: NEGATIVE
Protein, ur: 100 mg/dL — AB
RBC / HPF: >50 RBC/hpf — ABNORMAL HIGH (ref 0–5)
Specific Gravity, Urine: 1.012 (ref 1.005–1.030)
WBC, UA: >50 WBC/hpf — ABNORMAL HIGH (ref 0–5)
pH: 5 (ref 5.0–8.0)

## 2021-06-27 LAB — CBG MONITORING, ED
Glucose-Capillary: 190 mg/dL — ABNORMAL HIGH (ref 70–99)
Glucose-Capillary: 183 mg/dL — ABNORMAL HIGH (ref 70–99)
Glucose-Capillary: 200 mg/dL — ABNORMAL HIGH (ref 70–99)
Glucose-Capillary: 250 mg/dL — ABNORMAL HIGH (ref 70–99)
Glucose-Capillary: 271 mg/dL — ABNORMAL HIGH (ref 70–99)

## 2021-06-27 LAB — HEPATIC FUNCTION PANEL
ALT: 18 U/L (ref 0–44)
AST: 20 U/L (ref 15–41)
Albumin: 2.6 g/dL — ABNORMAL LOW (ref 3.5–5.0)
Alkaline Phosphatase: 56 U/L (ref 38–126)
Bilirubin, Direct: <0.1 mg/dL (ref 0.0–0.2)
Total Bilirubin: 0.5 mg/dL (ref 0.3–1.2)
Total Protein: 6.4 g/dL — ABNORMAL LOW (ref 6.5–8.1)

## 2021-06-27 LAB — PROTEIN / CREATININE RATIO, URINE
Creatinine, Urine: 73 mg/dL
Protein Creatinine Ratio: 1.81 mg/mg{Cre} — ABNORMAL HIGH (ref 0.00–0.15)
Total Protein, Urine: 132 mg/dL

## 2021-06-27 LAB — CREATININE, SERUM
Creatinine, Ser: 9.14 mg/dL — ABNORMAL HIGH (ref 0.44–1.00)
GFR, Estimated: 4 mL/min — ABNORMAL LOW (ref >60)

## 2021-06-27 LAB — PROTIME-INR
INR: 1.4 — ABNORMAL HIGH (ref 0.8–1.2)
Prothrombin Time: 17.2 seconds — ABNORMAL HIGH (ref 11.4–15.2)

## 2021-06-27 LAB — LACTIC ACID, PLASMA
Lactic Acid, Venous: 5.7 mmol/L (ref 0.5–1.9)
Lactic Acid, Venous: 6.3 mmol/L (ref 0.5–1.9)
Lactic Acid, Venous: 5.4 mmol/L (ref 0.5–1.9)
Lactic Acid, Venous: 4.8 mmol/L (ref 0.5–1.9)

## 2021-06-27 LAB — HEPATITIS B SURFACE ANTIGEN: Hepatitis B Surface Ag: NONREACTIVE

## 2021-06-27 LAB — ECHOCARDIOGRAM COMPLETE
AR max vel: 2.04 cm2
AV Area VTI: 2.21 cm2
AV Area mean vel: 1.91 cm2
AV Mean grad: 4 mmHg
AV Peak grad: 6.9 mmHg
Ao pk vel: 1.31 m/s
Area-P 1/2: 9.37 cm2
Height: 62 in
S' Lateral: 3.21 cm
Weight: 3520 oz

## 2021-06-27 LAB — CBC
HCT: 28.7 % — ABNORMAL LOW (ref 36.0–46.0)
Hemoglobin: 9.6 g/dL — ABNORMAL LOW (ref 12.0–15.0)
MCH: 27.6 pg (ref 26.0–34.0)
MCHC: 33.4 g/dL (ref 30.0–36.0)
MCV: 82.5 fL (ref 80.0–100.0)
Platelets: 381 10*3/uL (ref 150–400)
RBC: 3.48 MIL/uL — ABNORMAL LOW (ref 3.87–5.11)
RDW: 15 % (ref 11.5–15.5)
WBC: 19.4 10*3/uL — ABNORMAL HIGH (ref 4.0–10.5)
nRBC: 0 % (ref 0.0–0.2)

## 2021-06-27 LAB — APTT: aPTT: 33 seconds (ref 24–36)

## 2021-06-27 LAB — RESP PANEL BY RT-PCR (FLU A&B, COVID) ARPGX2
Influenza A by PCR: NEGATIVE
Influenza B by PCR: NEGATIVE
SARS Coronavirus 2 by RT PCR: NEGATIVE

## 2021-06-27 LAB — TROPONIN I (HIGH SENSITIVITY)
Troponin I (High Sensitivity): 28 ng/L — ABNORMAL HIGH (ref <18)
Troponin I (High Sensitivity): 14 ng/L (ref <18)
Troponin I (High Sensitivity): 13 ng/L (ref <18)

## 2021-06-27 LAB — MAGNESIUM: Magnesium: 1.7 mg/dL (ref 1.7–2.4)

## 2021-06-27 LAB — IRON AND TIBC
Iron: 41 ug/dL (ref 28–170)
Saturation Ratios: 18 % (ref 10.4–31.8)
TIBC: 228 ug/dL — ABNORMAL LOW (ref 250–450)
UIBC: 187 ug/dL

## 2021-06-27 LAB — BRAIN NATRIURETIC PEPTIDE: B Natriuretic Peptide: 141.4 pg/mL — ABNORMAL HIGH (ref 0.0–100.0)

## 2021-06-27 LAB — HEPATITIS B CORE ANTIBODY, IGM: Hep B C IgM: NONREACTIVE

## 2021-06-27 LAB — GLUCOSE, CAPILLARY: Glucose-Capillary: 242 mg/dL — ABNORMAL HIGH (ref 70–99)

## 2021-06-27 LAB — PROCALCITONIN: Procalcitonin: 6.83 ng/mL

## 2021-06-27 LAB — HEPATITIS C ANTIBODY: HCV Ab: NONREACTIVE

## 2021-06-27 LAB — VITAMIN B12: Vitamin B-12: 192 pg/mL (ref 180–914)

## 2021-06-27 LAB — HEPATITIS B SURFACE ANTIBODY,QUALITATIVE: Hep B S Ab: NONREACTIVE

## 2021-06-27 MED ORDER — AMLODIPINE BESYLATE 10 MG PO TABS
10.0000 mg | ORAL_TABLET | Freq: Every day | ORAL | Status: DC
Start: 2021-06-27 — End: 2021-06-30
  Administered 2021-06-28 – 2021-06-29 (×2): 10 mg via ORAL
  Filled 2021-06-27: qty 1
  Filled 2021-06-27: qty 2
  Filled 2021-06-27: qty 1

## 2021-06-27 MED ORDER — INSULIN ASPART 100 UNIT/ML IJ SOLN
10.0000 [IU] | Freq: Once | INTRAMUSCULAR | Status: AC
  Administered 2021-06-27: 10 [IU] via INTRAVENOUS
  Filled 2021-06-27: qty 1

## 2021-06-27 MED ORDER — SODIUM CHLORIDE 0.45 % IV SOLN
INTRAVENOUS | Status: DC
  Filled 2021-06-27 (×6): qty 75

## 2021-06-27 MED ORDER — LACTATED RINGERS IV SOLN: INTRAVENOUS | Status: DC

## 2021-06-27 MED ORDER — PATIROMER SORBITEX CALCIUM 8.4 G PO PACK
25.2000 g | PACK | Freq: Every day | ORAL | Status: DC
  Administered 2021-06-27 – 2021-06-28 (×2): 25.2 g via ORAL
  Filled 2021-06-27 (×5): qty 3

## 2021-06-27 MED ORDER — LACTATED RINGERS IV BOLUS (SEPSIS)
600.0000 mL | Freq: Once | INTRAVENOUS | Status: AC
  Administered 2021-06-27: 600 mL via INTRAVENOUS

## 2021-06-27 MED ORDER — SODIUM CHLORIDE 0.9 % IV SOLN
2.0000 g | INTRAVENOUS | Status: DC
Start: 2021-06-27 — End: 2021-06-29
  Administered 2021-06-27 – 2021-06-29 (×3): 2 g via INTRAVENOUS
  Filled 2021-06-27: qty 20
  Filled 2021-06-27: qty 2
  Filled 2021-06-27: qty 20
  Filled 2021-06-27: qty 2

## 2021-06-27 MED ORDER — LACTATED RINGERS IV BOLUS
1000.0000 mL | Freq: Once | INTRAVENOUS | Status: AC
  Administered 2021-06-27: 1000 mL via INTRAVENOUS

## 2021-06-27 MED ORDER — ALBUTEROL SULFATE (2.5 MG/3ML) 0.083% IN NEBU
5.0000 mg | INHALATION_SOLUTION | Freq: Once | RESPIRATORY_TRACT | Status: AC
  Administered 2021-06-27: 5 mg via RESPIRATORY_TRACT
  Filled 2021-06-27: qty 6

## 2021-06-27 MED ORDER — SODIUM BICARBONATE 650 MG PO TABS
1300.0000 mg | ORAL_TABLET | Freq: Three times a day (TID) | ORAL | Status: DC
  Administered 2021-06-27 – 2021-06-29 (×8): 1300 mg via ORAL
  Filled 2021-06-27 (×13): qty 2

## 2021-06-27 MED ORDER — DEXTROSE 50 % IV SOLN
1.0000 | Freq: Once | INTRAVENOUS | Status: AC
  Administered 2021-06-27: 50 mL via INTRAVENOUS
  Filled 2021-06-27: qty 50

## 2021-06-27 MED ORDER — METOPROLOL TARTRATE 25 MG PO TABS
25.0000 mg | ORAL_TABLET | Freq: Two times a day (BID) | ORAL | Status: DC
  Administered 2021-06-27 – 2021-06-29 (×6): 25 mg via ORAL
  Filled 2021-06-27 (×5): qty 1

## 2021-06-27 MED ORDER — HEPARIN SODIUM (PORCINE) 5000 UNIT/ML IJ SOLN
5000.0000 [IU] | Freq: Three times a day (TID) | INTRAMUSCULAR | Status: DC
  Administered 2021-06-27 – 2021-07-02 (×16): 5000 [IU] via SUBCUTANEOUS
  Filled 2021-06-27 (×16): qty 1

## 2021-06-27 MED ORDER — INSULIN ASPART 100 UNIT/ML IJ SOLN
0.0000 [IU] | INTRAMUSCULAR | Status: DC
  Administered 2021-06-27: 2 [IU] via SUBCUTANEOUS
  Administered 2021-06-27: 3 [IU] via SUBCUTANEOUS
  Administered 2021-06-27: 1 [IU] via SUBCUTANEOUS
  Administered 2021-06-28 (×2): 3 [IU] via SUBCUTANEOUS
  Administered 2021-06-28: 5 [IU] via SUBCUTANEOUS
  Administered 2021-06-28 (×2): 3 [IU] via SUBCUTANEOUS
  Administered 2021-06-28 – 2021-06-29 (×3): 2 [IU] via SUBCUTANEOUS
  Administered 2021-06-29: 1 [IU] via SUBCUTANEOUS
  Administered 2021-06-29: 4 [IU] via SUBCUTANEOUS
  Administered 2021-06-30: 2 [IU] via SUBCUTANEOUS
  Filled 2021-06-27 (×15): qty 1

## 2021-06-27 MED ORDER — LACTATED RINGERS IV BOLUS: 1000.0000 mL | Freq: Once | INTRAVENOUS | Status: DC

## 2021-06-27 MED ORDER — SODIUM BICARBONATE 8.4 % IV SOLN
50.0000 meq | Freq: Once | INTRAVENOUS | Status: AC
  Administered 2021-06-27: 50 meq via INTRAVENOUS
  Filled 2021-06-27: qty 50

## 2021-06-27 MED ORDER — SODIUM CHLORIDE 0.9 % IV SOLN
500.0000 mg | INTRAVENOUS | Status: DC
  Administered 2021-06-27 – 2021-06-29 (×3): 500 mg via INTRAVENOUS
  Filled 2021-06-27 (×4): qty 500

## 2021-06-27 MED ORDER — SODIUM POLYSTYRENE SULFONATE 15 GM/60ML PO SUSP
30.0000 g | Freq: Once | ORAL | Status: AC
  Administered 2021-06-27: 30 g via ORAL
  Filled 2021-06-27: qty 120

## 2021-06-27 NOTE — ED Notes (Signed)
Echo @ the bedside

## 2021-06-27 NOTE — ED Notes (Signed)
Pt cleaned of diarrhea stool filled brief at this time.  New brief placed on pt.  Pt denies any other needs at this time.

## 2021-06-27 NOTE — H&P (Addendum)
History and Physical    MONTE BRONDER ZOX:[id number] DOB: [birth year] DOA: 06/27/2021  PCP: [name], PA-C  Patient coming from: Home.  Chief Complaint: Weakness.  HPI: Heather Lopez is a 75 y.o. female with history of hypertension, diabetes mellitus, hyperlipidemia has been feeling weak for the last 3 to 4 days.  Had gone to her primary care about 4 days ago was prescribed Levaquin and prednisone for possible pneumonia.  Despite taking which patient was still feeling weak and having poor appetite unable to eat anything.  Had 1 episode of vomiting.  Denies chest pain or productive cough.  ED Course: In the ER patient appears generally weak.  Labs were showing creatinine of 9.3 which increased from in December 22, 2019.  Potassium was 5.5 sodium 124 bicarb of 9 procalcitonin of 6.8 lactic acid of 5.7 WBC 24.4 hemoglobin 10.4.  ER physician discussed with on-call nephrologist who advised patient to be placed on bicarb drip.  Patient also received fluid bolus and also received D50 IV insulin and sodium bicarb.  COVID test was negative.  Chest x-ray showing possible infiltrates for which patient was started on antibiotics.  While in the ER patient had 1 episode of diarrhea.  Lactic acid level was 5.7 worsened to 6.3.  Review of Systems: As per HPI, rest all negative.   Past Medical History:  Diagnosis Date   Anxiety    Cataract    Depression    Diabetes mellitus without complication ([place])    Gout    History of kidney stones    Hyperlipidemia    Hypertension    Vertigo     Past Surgical History:  Procedure Laterality Date   CATARACT EXTRACTION, BILATERAL Bilateral 2019   COLONOSCOPY WITH PROPOFOL N/A 08/19/2015   Procedure: COLONOSCOPY WITH PROPOFOL;  Surgeon: Lucilla Lame, MD;  Location: [facility];  Service: Endoscopy;  Laterality: N/A;  diabetic - insulin   CYSTOSCOPY W/ RETROGRADES Bilateral 12/22/2019   Procedure: CYSTOSCOPY WITH RETROGRADE PYELOGRAM;  Surgeon: Abbie Sons, MD;  Location: [facility];  Service: Urology;  Laterality: Bilateral;   CYSTOSCOPY WITH BIOPSY N/A 12/22/2019   Procedure: CYSTOSCOPY WITH bladder BIOPSY;  Surgeon: Abbie Sons, MD;  Location: [facility];  Service: Urology;  Laterality: N/A;   CYSTOSCOPY WITH STENT PLACEMENT Right 12/22/2019   Procedure: CYSTOSCOPY WITH STENT PLACEMENT;  Surgeon: Abbie Sons, MD;  Location: [facility];  Service: Urology;  Laterality: Right;   CYSTOSCOPY WITH URETEROSCOPY Bilateral 12/22/2019   Procedure: CYSTOSCOPY WITH URETEROSCOPY;  Surgeon: Abbie Sons, MD;  Location: [facility];  Service: Urology;  Laterality: Bilateral;   CYSTOSCOPY/URETEROSCOPY/HOLMIUM LASER/STENT PLACEMENT Right 12/22/2019   Procedure: CYSTOSCOPY/URETEROSCOPY/HOLMIUM LASER/STENT PLACEMENT;  Surgeon: Abbie Sons, MD;  Location: [facility];  Service: Urology;  Laterality: Right;   EYE SURGERY     POLYPECTOMY  08/19/2015   Procedure: POLYPECTOMY;  Surgeon: Lucilla Lame, MD;  Location: [place];  Service: Endoscopy;;   TUBAL LIGATION  1992     reports that she quit smoking about 49 years ago. Her smoking use included cigarettes. She has a 4.50 pack-year smoking history. She has never used smokeless tobacco. She reports that she does not drink alcohol and does not use drugs.  Allergies  Allergen Reactions   Contrast Media  [Iodinated Diagnostic Agents] Anaphylaxis   Iodine Swelling    (IV only) - angioedema    Family History  Problem Relation Age of Onset   Diabetes Father  Cancer Father        lung cancer   Diabetes Brother    Stroke Mother     Prior to Admission medications   Medication Sig Start Date End Date Taking? Authorizing Provider  amLODipine (NORVASC) 10 MG tablet Take 1 tablet (10 mg total) by mouth daily. 04/15/18  Yes Mikey College, NP  aspirin EC 81 MG tablet Take 81 mg by mouth daily.   Yes [provider]  atorvastatin (LIPITOR) 20 MG tablet Take 1 tablet (20 mg total) by  mouth daily. 04/15/18  Yes Mikey College, NP  benzonatate (TESSALON) 200 MG capsule Take 200 mg by mouth 3 (three) times daily as needed. 06/23/21  Yes [provider]  buPROPion (WELLBUTRIN XL) 150 MG 24 hr tablet Take 1 tablet (150 mg total) by mouth daily. 05/26/18  Yes Mikey College, NP  insulin aspart (NOVOLOG FLEXPEN) 100 UNIT/ML FlexPen Use meal coverage 6 units with every meal plus correction scale three times daily up to total of 35 units per day. 04/15/18  Yes Mikey College, NP  Insulin Pen Needle 32G X 4 MM MISC Inject insulin pens up to 5 times daily 04/15/18  Yes Mikey College, NP  Lancets (FREESTYLE) lancets USE AS INSTRUCTED 06/16/19  Yes Karamalegos, Alexander J, DO  LEVEMIR FLEXTOUCH 100 UNIT/ML Pen INJECT 50 UNITS IN THE MORNING AND 44 UNITS IN THE EVENING Patient taking differently: Inject 52 Units into the skin 2 (two) times daily. 06/30/18  Yes Mikey College, NP  levofloxacin (LEVAQUIN) 500 MG tablet Take 500 mg by mouth daily. 06/23/21  Yes [provider]  losartan (COZAAR) 50 MG tablet Take 1 tablet (50 mg total) by mouth daily. 04/15/18  Yes Mikey College, NP  metFORMIN (GLUCOPHAGE) 1000 MG tablet Take 1 tablet (1,000 mg total) by mouth 2 (two) times daily. 04/15/18  Yes Mikey College, NP  metoprolol tartrate (LOPRESSOR) 25 MG tablet Take 1 tablet (25 mg total) by mouth 2 (two) times daily. 05/26/18  Yes Mikey College, NP  Multiple Vitamin (MULTI-VITAMIN) tablet Take 1 tablet by mouth daily.    Yes [provider]  ondansetron (ZOFRAN-ODT) 4 MG disintegrating tablet Take 4 mg by mouth every 8 (eight) hours as needed. 06/23/21  Yes [provider]  predniSONE (DELTASONE) 20 MG tablet Take 20 mg by mouth 2 (two) times daily. 06/23/21  Yes [provider]  TRULICITY 3.32 RJ/1.8AC SOPN SMARTSIG:0.5 Milliliter(s) SUB-Q Once a Week 06/19/21  Yes [provider]    Physical  Exam: Constitutional: Moderately built and nourished. Vitals:   06/27/21 0130 06/27/21 0200 06/27/21 0230 06/27/21 0321  BP: 107/67 124/64 (!) 114/49   Pulse:  76 83   Resp: (!) 27 (!) 26 (!) 28   Temp:    (!) 97.5 F (36.4 C)  TempSrc:    Oral  SpO2:  95% 99%   Weight:      Height:       Eyes: Anicteric no pallor. ENMT: No discharge from the ears eyes nose and mouth: Neck: No mass felt.  No neck rigidity. Respiratory: No rhonchi or crepitations. Cardiovascular: S1-S2 heard. Abdomen: Soft nontender bowel sound present. Musculoskeletal: No edema. Skin: No rash. Neurologic: Alert awake oriented time place and person.  Moves all extremities. Psychiatric: Appears normal.  Normal affect.   Labs on Admission: I have personally reviewed following labs and imaging studies  CBC: Recent Labs  Lab 06/27/21 0032  WBC 24.4*  NEUTROABS 21.1*  HGB 10.4*  HCT 30.9*  MCV 83.7  PLT 893*   Basic Metabolic Panel: Recent Labs  Lab 06/27/21 0032  NA 124*  K 7.5*  CL 93*  CO2 9*  GLUCOSE 206*  BUN 154*  CREATININE 9.32*  CALCIUM 8.3*   GFR: Estimated Creatinine Clearance: 5.9 mL/min (A) (by C-G formula based on SCr of 9.32 mg/dL (H)). Liver Function Tests: No results for input(s): AST, ALT, ALKPHOS, BILITOT, PROT, ALBUMIN in the last 168 hours. No results for input(s): LIPASE, AMYLASE in the last 168 hours. No results for input(s): AMMONIA in the last 168 hours. Coagulation Profile: Recent Labs  Lab 06/27/21 0032  INR 1.4*   Cardiac Enzymes: No results for input(s): CKTOTAL, CKMB, CKMBINDEX, TROPONINI in the last 168 hours. BNP (last 3 results) No results for input(s): PROBNP in the last 8760 hours. HbA1C: No results for input(s): HGBA1C in the last 72 hours. CBG: No results for input(s): GLUCAP in the last 168 hours. Lipid Profile: No results for input(s): CHOL, HDL, LDLCALC, TRIG, CHOLHDL, LDLDIRECT in the last 72 hours. Thyroid Function Tests: No results for  input(s): TSH, T4TOTAL, FREET4, T3FREE, THYROIDAB in the last 72 hours. Anemia Panel: No results for input(s): VITAMINB12, FOLATE, FERRITIN, TIBC, IRON, RETICCTPCT in the last 72 hours. Urine analysis:    Component Value Date/Time   COLORURINE YELLOW (A) 06/27/2021 0239   APPEARANCEUR TURBID (A) 06/27/2021 0239   APPEARANCEUR Cloudy (A) 12/30/2019 1439   LABSPEC 1.012 06/27/2021 0239   LABSPEC 1.024 01/27/2014 1350   PHURINE 5.0 06/27/2021 0239   GLUCOSEU 50 (A) 06/27/2021 0239   GLUCOSEU >=500 01/27/2014 1350   HGBUR LARGE (A) 06/27/2021 0239   BILIRUBINUR NEGATIVE 06/27/2021 0239   BILIRUBINUR Negative 12/30/2019 1439   BILIRUBINUR Negative 01/27/2014 1350   KETONESUR NEGATIVE 06/27/2021 0239   PROTEINUR 100 (A) 06/27/2021 0239   UROBILINOGEN 0.2 12/17/2017 1524   NITRITE NEGATIVE 06/27/2021 0239   LEUKOCYTESUR LARGE (A) 06/27/2021 0239   LEUKOCYTESUR 2+ 01/27/2014 1350   Sepsis Labs: @LABRCNTIP (procalcitonin:4,lacticidven:4) ) Recent Results (from the past 240 hour(s))  Resp Panel by RT-PCR (Flu A&B, Covid) Nasopharyngeal Swab     Status: None   Collection Time: 06/27/21 12:32 AM   Specimen: Nasopharyngeal Swab; Nasopharyngeal(NP) swabs in vial transport medium  Result Value Ref Range Status   SARS Coronavirus 2 by RT PCR NEGATIVE NEGATIVE Final    Comment: (NOTE) SARS-CoV-2 target nucleic acids are NOT DETECTED.  The SARS-CoV-2 RNA is generally detectable in upper respiratory specimens during the acute phase of infection. The lowest concentration of SARS-CoV-2 viral copies this assay can detect is 138 copies/mL. A negative result does not preclude SARS-Cov-2 infection and should not be used as the sole basis for treatment or other patient management decisions. A negative result may occur with  improper specimen collection/handling, submission of specimen other than nasopharyngeal swab, presence of viral mutation(s) within the areas targeted by this assay, and  inadequate number of viral copies(<138 copies/mL). A negative result must be combined with clinical observations, patient history, and epidemiological information. The expected result is Negative.  Fact Sheet for Patients:  EntrepreneurPulse.com.au  Fact Sheet for Healthcare Providers:  IncredibleEmployment.be  This test is no t yet approved or cleared by the [place] FDA and  has been authorized for detection and/or diagnosis of SARS-CoV-2 by FDA under an Emergency Use Authorization (EUA). This EUA will remain  in effect (meaning this test can be used) for the duration of the COVID-19  declaration under Section 564(b)(1) of the Act, 21 U.S.C.section 360bbb-3(b)(1), unless the authorization is terminated  or revoked sooner.       Influenza A by PCR NEGATIVE NEGATIVE Final   Influenza B by PCR NEGATIVE NEGATIVE Final    Comment: (NOTE) The Xpert Xpress SARS-CoV-2/FLU/RSV plus assay is intended as an aid in the diagnosis of influenza from Nasopharyngeal swab specimens and should not be used as a sole basis for treatment. Nasal washings and aspirates are unacceptable for Xpert Xpress SARS-CoV-2/FLU/RSV testing.  Fact Sheet for Patients: EntrepreneurPulse.com.au  Fact Sheet for Healthcare Providers: IncredibleEmployment.be  This test is not yet approved or cleared by the [place] FDA and has been authorized for detection and/or diagnosis of SARS-CoV-2 by FDA under an Emergency Use Authorization (EUA). This EUA will remain in effect (meaning this test can be used) for the duration of the COVID-19 declaration under Section 564(b)(1) of the Act, 21 U.S.C. section 360bbb-3(b)(1), unless the authorization is terminated or revoked.  Performed at [facility], [address] [place], [place] [address]      Radiological Exams on Admission: DG Chest Portable 1 View  Result Date:  06/27/2021 CLINICAL DATA:  Shortness of breath EXAM: PORTABLE CHEST 1 VIEW COMPARISON:  01/27/2014 FINDINGS: Shallow lung inflation. Consolidation or atelectasis at the left lung base. Linear right basilar atelectasis. No pneumothorax. Suspect small left pleural effusion. Normal cardiomediastinal contours. IMPRESSION: Shallow lung inflation with left basilar consolidation or atelectasis and suspected small left pleural effusion. Electronically Signed   By: Ulyses Jarred M.D.   On: 06/27/2021 01:27    EKG: Independently reviewed.  Normal sinus rhythm with nonspecific ST-T changes.  Assessment/Plan Principal Problem:   ARF (acute renal failure) (HCC) Active Problems:   Uncontrolled type 2 diabetes mellitus with insulin therapy ([place])   Hyperlipidemia associated with type 2 diabetes mellitus ([place])   Acute respiratory failure with hypoxemia (HCC)    Acute renal failure on chronic kidney disease stage III with hyperkalemia -creatinine worsening with poor oral intake and patient also has been taking ARB on Toprol dehydration.  UA shows WBCs RBCs and large leukocyte esterase.  Patient had received D50 insulin and is on bicarb drip for hypokalemia as requested by nephrologist.  Follow renal ultrasound.  Follow intake output metabolic panel closely.  Patient states she was only planned to go to nephrologist next month she has a scheduled follow-up. Acute respiratory failure with hypoxia presently on BiPAP.  Could be from possible pneumonia for which patient is on antibiotics follow cultures. Lactic acidosis could be from dehydration or possible developing sepsis.  Follow metabolic panel and lactic acid levels. Hypertension we will hold ARB due to renal failure continue amlodipine and metoprolol.  Follow blood pressure trends. Diabetes mellitus type 2 we will keep patient on sliding scale coverage. History of hyperlipidemia.  Since patient with acute renal failure with hyperkalemia and respiratory failure  with pneumonia will need close monitoring and inpatient status.   DVT prophylaxis: Heparin. Code Status: Full code. Family Communication: We will need to discuss with family. Disposition Plan: Home. Consults called: ER physician consulted nephrology. Admission status: Inpatient.   Rise Patience MD [facility] Pager [phone].  If 7PM-7AM, please contact night-coverage [email or web address] Password [facility]  06/27/2021, 4:46 AM

## 2021-06-27 NOTE — ED Notes (Signed)
Pt taken off of bipap at this time and placed on 2L via [place] per Dr Roosevelt Locks. Pt tolerating transition well. Adjusted in bed and assisted with ice chips. O2 sats remain at 94-96%.

## 2021-06-27 NOTE — ED Notes (Signed)
Phlebotomy @ the bedside. 

## 2021-06-27 NOTE — Progress Notes (Signed)
*  PRELIMINARY RESULTS* Echocardiogram 2D Echocardiogram has been performed.  Heather Lopez 06/27/2021, 2:20 PM

## 2021-06-27 NOTE — ED Notes (Signed)
Lab contacted about the patients pending lab status.

## 2021-06-27 NOTE — Consult Note (Signed)
[facility]  CONSULT NOTE    Date: 06/27/2021                  Patient Name:  Heather Lopez  MRN: [id number]  DOB: 01/04/1946  Age / Sex: 75 y.o., female         PCP: Heather Denis, PA-C                 Service Requesting Consult: Dr.Zhang                 Reason for Consult: Acute kidney injury            History of Present Illness: [name] has been admitted for community acquired pneumonia. Patient with weakness for 3-4 days. She has been having shortness of breath. She was evaluated by her PCP, who diagnosed her with pneumonia and she was prescribed levofloxacin. She has no chest pain, no cough and no wheezing. She has been having a poor appetite with nausea and dry heaving. No actual vomiting. Patient has been having burning with urination. She was found to have a cute kidney injury with hyperkalemia, hyponatremia, metabolic acidosis and low albumin. She was then started on IV bicarbonate and hyperkalemia protocol. Renal ultrasound found to have bilateral hydronephrosis. Patient has a history of obstructive uropathy and has required ureteral stents in the past.    Medications: Outpatient medications: (Not in a hospital admission)   Current medications: Current Facility-Administered Medications  Medication Dose Route Frequency Provider Last Rate Last Admin   amLODipine (NORVASC) tablet 10 mg  10 mg Oral Daily Heather Patience, MD       azithromycin (ZITHROMAX) 500 mg in sodium chloride 0.9 % 250 mL IVPB  500 mg Intravenous Q24H Heather Patience, MD   Stopped at 06/27/21 0448   cefTRIAXone (ROCEPHIN) 2 g in sodium chloride 0.9 % 100 mL IVPB  2 g Intravenous Q24H Heather Patience, MD   Stopped at 06/27/21 0318   heparin injection 5,000 Units  5,000 Units Subcutaneous Q8H Heather Patience, MD   5,000 Units at 06/27/21 0536   insulin aspart (novoLOG) injection 0-6 Units  0-6 Units Subcutaneous Q4H Heather Patience, MD   1 Units at  06/27/21 0536   metoprolol tartrate (LOPRESSOR) tablet 25 mg  25 mg Oral BID Heather Patience, MD       sodium bicarbonate 75 mEq in sodium chloride 0.45 % 1,075 mL infusion   Intravenous Continuous Heather Patience, MD 100 mL/hr at 06/27/21 0448 New Bag at 06/27/21 0448   Current Outpatient Medications  Medication Sig Dispense Refill   amLODipine (NORVASC) 10 MG tablet Take 1 tablet (10 mg total) by mouth daily. 30 tablet 5   aspirin EC 81 MG tablet Take 81 mg by mouth daily.     atorvastatin (LIPITOR) 20 MG tablet Take 1 tablet (20 mg total) by mouth daily. 90 tablet 3   benzonatate (TESSALON) 200 MG capsule Take 200 mg by mouth 3 (three) times daily as needed.     buPROPion (WELLBUTRIN XL) 150 MG 24 hr tablet Take 1 tablet (150 mg total) by mouth daily. 90 tablet 1   insulin aspart (NOVOLOG FLEXPEN) 100 UNIT/ML FlexPen Use meal coverage 6 units with every meal plus correction scale three times daily up to total of 35 units per day. 45 mL 3   Insulin Pen Needle 32G X 4 MM MISC Inject insulin pens up to 5 times  daily 100 each 11   Lancets (FREESTYLE) lancets USE AS INSTRUCTED 100 each 11   LEVEMIR FLEXTOUCH 100 UNIT/ML Pen INJECT 50 UNITS IN THE MORNING AND 44 UNITS IN THE EVENING (Patient taking differently: Inject 52 Units into the skin 2 (two) times daily.) 90 mL 1   levofloxacin (LEVAQUIN) 500 MG tablet Take 500 mg by mouth daily.     losartan (COZAAR) 50 MG tablet Take 1 tablet (50 mg total) by mouth daily. 90 tablet 3   metFORMIN (GLUCOPHAGE) 1000 MG tablet Take 1 tablet (1,000 mg total) by mouth 2 (two) times daily. 180 tablet 3   metoprolol tartrate (LOPRESSOR) 25 MG tablet Take 1 tablet (25 mg total) by mouth 2 (two) times daily. 180 tablet 1   Multiple Vitamin (MULTI-VITAMIN) tablet Take 1 tablet by mouth daily.      ondansetron (ZOFRAN-ODT) 4 MG disintegrating tablet Take 4 mg by mouth every 8 (eight) hours as needed.     predniSONE (DELTASONE) 20 MG tablet Take 20 mg by  mouth 2 (two) times daily.     TRULICITY 7.59 FM/3.8GY SOPN SMARTSIG:0.5 Milliliter(s) SUB-Q Once a Week        Allergies: Allergies  Allergen Reactions   Contrast Media  [Iodinated Diagnostic Agents] Anaphylaxis   Iodine Swelling    (IV only) - angioedema      Past Medical History: Past Medical History:  Diagnosis Date   Anxiety    Cataract    Depression    Diabetes mellitus without complication ([place])    Gout    History of kidney stones    Hyperlipidemia    Hypertension    Vertigo      Past Surgical History: Past Surgical History:  Procedure Laterality Date   CATARACT EXTRACTION, BILATERAL Bilateral 2019   COLONOSCOPY WITH PROPOFOL N/A 08/19/2015   Procedure: COLONOSCOPY WITH PROPOFOL;  Surgeon: Heather Lame, MD;  Location: [place];  Service: Endoscopy;  Laterality: N/A;  diabetic - insulin   CYSTOSCOPY W/ RETROGRADES Bilateral 12/22/2019   Procedure: CYSTOSCOPY WITH RETROGRADE PYELOGRAM;  Surgeon: Heather Sons, MD;  Location: [facility];  Service: Urology;  Laterality: Bilateral;   CYSTOSCOPY WITH BIOPSY N/A 12/22/2019   Procedure: CYSTOSCOPY WITH bladder BIOPSY;  Surgeon: Heather Sons, MD;  Location: [facility];  Service: Urology;  Laterality: N/A;   CYSTOSCOPY WITH STENT PLACEMENT Right 12/22/2019   Procedure: CYSTOSCOPY WITH STENT PLACEMENT;  Surgeon: Heather Sons, MD;  Location: [facility];  Service: Urology;  Laterality: Right;   CYSTOSCOPY WITH URETEROSCOPY Bilateral 12/22/2019   Procedure: CYSTOSCOPY WITH URETEROSCOPY;  Surgeon: Heather Sons, MD;  Location: [facility];  Service: Urology;  Laterality: Bilateral;   CYSTOSCOPY/URETEROSCOPY/HOLMIUM LASER/STENT PLACEMENT Right 12/22/2019   Procedure: CYSTOSCOPY/URETEROSCOPY/HOLMIUM LASER/STENT PLACEMENT;  Surgeon: Heather Sons, MD;  Location: [facility];  Service: Urology;  Laterality: Right;   EYE SURGERY     POLYPECTOMY  08/19/2015   Procedure: POLYPECTOMY;  Surgeon: Heather Lame, MD;  Location:  [place];  Service: Endoscopy;;   TUBAL LIGATION  1992     Family History: Family History  Problem Relation Age of Onset   Diabetes Father    Cancer Father        lung cancer   Diabetes Brother    Stroke Mother      Social History: Social History   Socioeconomic History   Marital status: Married    Spouse name: Not on file   Number of children: Not on file  Years of education: Not on file   Highest education level: Not on file  Occupational History   Not on file  Tobacco Use   Smoking status: Former    Packs/day: 0.75    Years: 6.00    Pack years: 4.50    Types: Cigarettes    Quit date: 76    Years since quitting: 49.6   Smokeless tobacco: Never   Tobacco comments:    quit 1973  Vaping Use   Vaping Use: Never used  Substance and Sexual Activity   Alcohol use: No    Alcohol/week: 0.0 standard drinks   Drug use: No   Sexual activity: Not on file  Other Topics Concern   Not on file  Social History Narrative   Not on file   Social Determinants of Health   Financial Resource Strain: Not on file  Food Insecurity: Not on file  Transportation Needs: Not on file  Physical Activity: Not on file  Stress: Not on file  Social Connections: Not on file  Intimate Partner Violence: Not on file     Review of Systems: Review of Systems  Constitutional:  Positive for fever, malaise/fatigue and weight loss.  HENT: Negative.    Eyes: Negative.   Respiratory:  Positive for shortness of breath. Negative for cough, hemoptysis, sputum production and wheezing.   Cardiovascular:  Positive for leg swelling and PND. Negative for chest pain, palpitations, orthopnea and claudication.  Gastrointestinal: Negative.   Genitourinary:  Positive for dysuria, frequency and urgency. Negative for flank pain and hematuria.  Musculoskeletal:  Negative for back pain, falls, joint pain, myalgias and neck pain.  Skin: Negative.   Neurological: Negative.   Endo/Heme/Allergies:  Negative.   Psychiatric/Behavioral: Negative.     Vital Signs: Blood pressure 126/68, pulse 82, temperature (!) 97.5 F (36.4 C), temperature source Oral, resp. rate (!) 21, height 5\' 2"  (1.575 m), weight 99.8 kg, SpO2 96 %.  Weight trends: Filed Weights   06/27/21 0028  Weight: 99.8 kg    Physical Exam: General: Ill appearing  Head: Normocephalic, atraumatic. Moist oral mucosal membranes  Eyes: Anicteric, PERRL  Neck: Supple, trachea midline  Lungs:  Clear to auscultation  Heart: Regular rate and rhythm  Abdomen:  Soft, nontender, obese  Extremities:  trace peripheral edema.  Neurologic: Nonfocal, moving all four extremities  Skin: No lesions  GU Foley with yellow urine.   Access: none     Lab results: Basic Metabolic Panel: Recent Labs  Lab 06/27/21 0032 06/27/21 0508 06/27/21 0713  NA 124*  --  130*  K 7.5*  --  6.5*  CL 93*  --  97*  CO2 9*  --  11*  GLUCOSE 206*  --  187*  BUN 154*  --  136*  CREATININE 9.32* 9.14* 9.37*  CALCIUM 8.3*  --  7.9*    Liver Function Tests: Recent Labs  Lab 06/27/21 0713  AST 20  ALT 18  ALKPHOS 56  BILITOT 0.5  PROT 6.4*  ALBUMIN 2.6*   No results for input(s): LIPASE, AMYLASE in the last 168 hours. No results for input(s): AMMONIA in the last 168 hours.  CBC: Recent Labs  Lab 06/27/21 0032 06/27/21 0508 06/27/21 0713  WBC 24.4* 19.4* 19.2*  NEUTROABS 21.1*  --  17.9*  HGB 10.4* 9.6* 8.9*  HCT 30.9* 28.7* 26.4*  MCV 83.7 82.5 83.8  PLT 483* 381 343    Cardiac Enzymes: No results for input(s): CKTOTAL, CKMB, CKMBINDEX, TROPONINI in the last  168 hours.  BNP: Invalid input(s): POCBNP  CBG: Recent Labs  Lab 06/27/21 0519 06/27/21 0715  GLUCAP 190* 183*    Microbiology: Results for orders placed or performed during the hospital encounter of 06/27/21  Resp Panel by RT-PCR (Flu A&B, Covid) Nasopharyngeal Swab     Status: None   Collection Time: 06/27/21 12:32 AM   Specimen: Nasopharyngeal Swab;  Nasopharyngeal(NP) swabs in vial transport medium  Result Value Ref Range Status   SARS Coronavirus 2 by RT PCR NEGATIVE NEGATIVE Final    Comment: (NOTE) SARS-CoV-2 target nucleic acids are NOT DETECTED.  The SARS-CoV-2 RNA is generally detectable in upper respiratory specimens during the acute phase of infection. The lowest concentration of SARS-CoV-2 viral copies this assay can detect is 138 copies/mL. A negative result does not preclude SARS-Cov-2 infection and should not be used as the sole basis for treatment or other patient management decisions. A negative result may occur with  improper specimen collection/handling, submission of specimen other than nasopharyngeal swab, presence of viral mutation(s) within the areas targeted by this assay, and inadequate number of viral copies(<138 copies/mL). A negative result must be combined with clinical observations, patient history, and epidemiological information. The expected result is Negative.  Fact Sheet for Patients:  EntrepreneurPulse.com.au  Fact Sheet for Healthcare Providers:  IncredibleEmployment.be  This test is no t yet approved or cleared by the [place] FDA and  has been authorized for detection and/or diagnosis of SARS-CoV-2 by FDA under an Emergency Use Authorization (EUA). This EUA will remain  in effect (meaning this test can be used) for the duration of the COVID-19 declaration under Section 564(b)(1) of the Act, 21 U.S.C.section 360bbb-3(b)(1), unless the authorization is terminated  or revoked sooner.       Influenza A by PCR NEGATIVE NEGATIVE Final   Influenza B by PCR NEGATIVE NEGATIVE Final    Comment: (NOTE) The Xpert Xpress SARS-CoV-2/FLU/RSV plus assay is intended as an aid in the diagnosis of influenza from Nasopharyngeal swab specimens and should not be used as a sole basis for treatment. Nasal washings and aspirates are unacceptable for Xpert Xpress  SARS-CoV-2/FLU/RSV testing.  Fact Sheet for Patients: EntrepreneurPulse.com.au  Fact Sheet for Healthcare Providers: IncredibleEmployment.be  This test is not yet approved or cleared by the [place] FDA and has been authorized for detection and/or diagnosis of SARS-CoV-2 by FDA under an Emergency Use Authorization (EUA). This EUA will remain in effect (meaning this test can be used) for the duration of the COVID-19 declaration under Section 564(b)(1) of the Act, 21 U.S.C. section 360bbb-3(b)(1), unless the authorization is terminated or revoked.  Performed at [facility], [address] [place], [place] [address]   Blood culture (routine x 2)     Status: None (Preliminary result)   Collection Time: 06/27/21 12:43 AM   Specimen: BLOOD  Result Value Ref Range Status   Specimen Description BLOOD RIGHT ASSIST CONTROL  Final   Special Requests   Final    BOTTLES DRAWN AEROBIC AND ANAEROBIC Blood Culture results may not be optimal due to an inadequate volume of blood received in culture bottles   Culture   Final    NO GROWTH < 12 HOURS Performed at [facility], [address] [place], [place] [address]    Report Status PENDING  Incomplete  Blood culture (routine x 2)     Status: None (Preliminary result)   Collection Time: 06/27/21  1:45 AM   Specimen: BLOOD  Result Value Ref  Range Status   Specimen Description BLOOD RIGHT ASSIST CONTROL  Final   Special Requests   Final    BOTTLES DRAWN AEROBIC AND ANAEROBIC Blood Culture adequate volume   Culture   Final    NO GROWTH < 12 HOURS Performed at [facility], [address], [place], [place] [address]    Report Status PENDING  Incomplete    Coagulation Studies: Recent Labs    06/27/21 0032  LABPROT 17.2*  INR 1.4*    Urinalysis: Recent Labs    06/27/21 0239  COLORURINE YELLOW*  LABSPEC 1.012  PHURINE 5.0  GLUCOSEU 50*  HGBUR LARGE*   BILIRUBINUR NEGATIVE  KETONESUR NEGATIVE  PROTEINUR 100*  NITRITE NEGATIVE  LEUKOCYTESUR LARGE*      Imaging: US RENAL  Result Date: 06/27/2021 CLINICAL DATA:  Acute renal failure EXAM: RENAL / URINARY TRACT ULTRASOUND COMPLETE COMPARISON:  12/15/2019 abdominal CT FINDINGS: Right Kidney: Renal measurements: 13 x 6 x 6 cm = volume: 240 mL. Moderate hydronephrosis, similar to comparison CT. There is a shadowing stone at the lower pole measuring 13 mm. Left Kidney: Renal measurements: 13 x 8 x 7 cm = volume: 360 mL. Mild hydronephrosis, new from comparison study. Branching stone at the upper pole which is underestimated compared to comparison CT. No evidence of mass Bladder: History of Foley catheter with complete bladder decompression. IMPRESSION: 1. Moderate right hydronephrosis that is stable from January 2021 CT. 2. Mild left hydronephrosis which is new from 2021. 3. Bilateral nephrolithiasis. 4. Completely drained bladder. Electronically Signed   By: Monte Fantasia M.D.   On: 06/27/2021 06:13   DG Chest Portable 1 View  Result Date: 06/27/2021 CLINICAL DATA:  Shortness of breath EXAM: PORTABLE CHEST 1 VIEW COMPARISON:  01/27/2014 FINDINGS: Shallow lung inflation. Consolidation or atelectasis at the left lung base. Linear right basilar atelectasis. No pneumothorax. Suspect small left pleural effusion. Normal cardiomediastinal contours. IMPRESSION: Shallow lung inflation with left basilar consolidation or atelectasis and suspected small left pleural effusion. Electronically Signed   By: Ulyses Jarred M.D.   On: 06/27/2021 01:27     Assessment & Plan: Ms. ALASKA FLETT is a 75 y.o. white female with diabetes mellitus type II, hypertension, hyperlipidemia, vertigo, nephrolithiasis, gout, depression, and anxiety who was admitted to [facility] on 06/27/2021 for Acute respiratory failure with hypoxemia ([place]) [J96.01] ARF (acute renal failure) ([place]) [N17.9]  Acute kidney injury with hyperkalemia and metabolic  acidosis on chronic kidney disease stage IV with proteinuria: baseline creatinine of 1.97, GFR of 25 on 12/22/2019. Chronic kidney disease secondary to diabetic nephropathy. Acute kidney injury secondary to sepsis and obstructive uropathy. Foley catheter has been placed and now patient with nonoliguric urine output.  Continue foley catheter.  Holding losartan, and metformin.  Continue sodium bicarbonate infusion Start [place] for hyperkalemia Start PO sodium bicarbonate.  Low threshold to start dialysis.  If follow up labs show no improvement, will proceed with renal replacement therapy.  Acute respiratory failure: secondary to community acquired pneumonia failing outpatient antibiotics. Febrile overnight. Requiring oxygen. Blood cultures pending Continue oxygen support Continue empiric antibiotics: azithromycin and ceftriaxone.  Consider stress dose steroids.  Anemia with kidney disease:  hemoglobin and platelets trending down. Normocytic.  Hypertension: blood pressure at goal 126/68.  Continue amlodipine and metoprolol.  Check echocardiogram (also looking for endocarditis) Nephrolithiasis: with obstructive uropathy and symptoms consistent with urinary tract infection. Urine culture pending. Empiric antibiotics as above. Follows with urology as outpatient. Has history of ureteral stent placement.  Hyponatremia:  secondary to hypovolemia. Improving with volume replacement.  Diabetes mellitus type II with chronic kidney disease: insulin dependent. Hemoglobin A1c of 8.3% on 7/18.  Holding metformin as above.       LOS: 0 Dewana Ammirati 8/2/20228:49 AM

## 2021-06-27 NOTE — ED Notes (Signed)
Pt moved to hospital bed for comfort.

## 2021-06-27 NOTE — Sepsis Progress Note (Signed)
[facility] following for Sepsis Protocol 

## 2021-06-27 NOTE — Progress Notes (Addendum)
PROGRESS NOTE    Heather Lopez  YPP:[id number] DOB: 1946/03/04 DOA: 06/27/2021 PCP: [name], PA-C   Chief complaint.  Generalized weakness. Brief Narrative:  Heather Lopez is a 75 y.o. female with history of hypertension, diabetes mellitus, hyperlipidemia has been feeling weak for the last 3 to 4 days.  Had gone to her primary care about 4 days ago was prescribed Levaquin and prednisone for possible pneumonia.  Despite taking which patient was still feeling weak and having poor appetite unable to eat anything.  Had 1 episode of vomiting. Upon arriving the emergency room, patient had a creatinine of 9.3, potassium was 7.5, sodium 124, pH is 7.15 with bicarb of 7.3.  Lactic acid peaked to 6.3, procalcitonin level 6.83.  White cell 24.4, platelets 483.  Patient also had INR 1.4.  Urine started showed a large amount of leukocytes.  Blood culture was sent out, urine culture also sent. Patient was given bicarb drip, nephrology consult obtained for acute renal failure with severe hyperkalemia. Patient was also placed on Zithromax and Rocephin for UTI. Patient was also placed on BiPAP overnight for worsening short of breath and acute hypoxemic respiratory failure.  Assessment & Plan:   Principal Problem:   ARF (acute renal failure) (HCC) Active Problems:   Uncontrolled type 2 diabetes mellitus with insulin therapy ([place])   Hyperlipidemia associated with type 2 diabetes mellitus ([place])   Acute respiratory failure with hypoxemia ([place])  #1.  Septic shock secondary to UTI. Acute hypoxemic respiratory failure on BiPAP. Urinary tract infection Left lower lobe pneumonia. Patient had a tachycardia, severe leukocytosis, elevated lactic acid level at 6.3.  Procalcitonin level 6.83.  She met septic shock criteria.  She was given fluids.  Her lactic acid came down to 4.8.  Continue fluids. She is covered for pneumonia with Zithromax and Rocephin, this also will cover UTI. Due to significant hyponatremia and  renal failure, I will also check a Legionella antigen. Patient also developed acute respiratory failure most likely secondary to septic shock.  Patient was on BiPAP for the last night.  Short of breath seem to be better, will attempt to wean off BiPAP.  2.  Acute renal failure. Severe metabolic acidosis. Severe hyperkalemia. Hyponatremia. Discussed with nephrology, patient need urgent hemodialysis.  Continue sodium bicarb drip  #3.  Anemia. Thrombocytosis secondary to sepsis. Check iron B12 level.  4.  Type 2 diabetes. Continue sliding scale insulin.  5.  Essential hypertension. Hold off all blood pressure medicines due to septic shock.  1610. Reassessed patient, feels better, potassium 5.9, off bipap. Can transfer to progressive unit.  DVT prophylaxis: Heparin Code Status: full Family Communication: husband updated Disposition Plan:    Status is: Inpatient  Remains inpatient appropriate because:Hemodynamically unstable, Ongoing active pain requiring inpatient pain management, IV treatments appropriate due to intensity of illness or inability to take PO, and Inpatient level of care appropriate due to severity of illness  Dispo: The patient is from: Home              Anticipated d/c is to: Home              Patient currently is not medically stable to d/c.   Difficult to place patient Yes        I/O last 3 completed shifts: In: 350 [IV Piggyback:350] Out: -  No intake/output data recorded.     Consultants:  nephrology  Procedures: hd  Antimicrobials: Rocephin and Zithromax.  Subjective: Patient is still on BiPAP,  she feels short of breath better this morning. She denies any chest pain or palpitation. He denies any fever or chills. No abdominal pain or nausea vomiting. No chest pain or palpitation.  Objective: Vitals:   06/27/21 0738 06/27/21 0800 06/27/21 0830 06/27/21 0845  BP:  110/66 126/68   Pulse:  77 81 82  Resp:  19 18 (!) 21  Temp:       TempSrc:      SpO2: 100% 98% 95% 96%  Weight:      Height:        Intake/Output Summary (Last 24 hours) at 06/27/2021 0900 Last data filed at 06/27/2021 0448 Gross per 24 hour  Intake 350 ml  Output --  Net 350 ml   Filed Weights   06/27/21 0028  Weight: 99.8 kg    Examination:  General exam: Appears calm and comfortable  Respiratory system: Clear to auscultation. Respiratory effort normal. Cardiovascular system: S1 & S2 heard, RRR. No JVD, murmurs, rubs, gallops or clicks. No pedal edema. Gastrointestinal system: Abdomen is nondistended, soft and nontender. No organomegaly or masses felt. Normal bowel sounds heard. Central nervous system: Alert and oriented x3.  No focal neurological deficits. Extremities: Symmetric 5 x 5 power. Skin: No rashes, lesions or ulcers Psychiatry: Judgement and insight appear normal. Mood & affect appropriate.     Data Reviewed: I have personally reviewed following labs and imaging studies  CBC: Recent Labs  Lab 06/27/21 0032 06/27/21 0508 06/27/21 0713  WBC 24.4* 19.4* 19.2*  NEUTROABS 21.1*  --  17.9*  HGB 10.4* 9.6* 8.9*  HCT 30.9* 28.7* 26.4*  MCV 83.7 82.5 83.8  PLT 483* 381 950   Basic Metabolic Panel: Recent Labs  Lab 06/27/21 0032 06/27/21 0508 06/27/21 0713  NA 124*  --  130*  K 7.5*  --  6.5*  CL 93*  --  97*  CO2 9*  --  11*  GLUCOSE 206*  --  187*  BUN 154*  --  136*  CREATININE 9.32* 9.14* 9.37*  CALCIUM 8.3*  --  7.9*   GFR: Estimated Creatinine Clearance: 5.9 mL/min (A) (by C-G formula based on SCr of 9.37 mg/dL (H)). Liver Function Tests: Recent Labs  Lab 06/27/21 0713  AST 20  ALT 18  ALKPHOS 56  BILITOT 0.5  PROT 6.4*  ALBUMIN 2.6*   No results for input(s): LIPASE, AMYLASE in the last 168 hours. No results for input(s): AMMONIA in the last 168 hours. Coagulation Profile: Recent Labs  Lab 06/27/21 0032  INR 1.4*   Cardiac Enzymes: No results for input(s): CKTOTAL, CKMB, CKMBINDEX, TROPONINI  in the last 168 hours. BNP (last 3 results) No results for input(s): PROBNP in the last 8760 hours. HbA1C: No results for input(s): HGBA1C in the last 72 hours. CBG: Recent Labs  Lab 06/27/21 0519 06/27/21 0715  GLUCAP 190* 183*   Lipid Profile: No results for input(s): CHOL, HDL, LDLCALC, TRIG, CHOLHDL, LDLDIRECT in the last 72 hours. Thyroid Function Tests: No results for input(s): TSH, T4TOTAL, FREET4, T3FREE, THYROIDAB in the last 72 hours. Anemia Panel: No results for input(s): VITAMINB12, FOLATE, FERRITIN, TIBC, IRON, RETICCTPCT in the last 72 hours. Sepsis Labs: Recent Labs  Lab 06/27/21 0032 06/27/21 0239 06/27/21 0508 06/27/21 0713  PROCALCITON 6.83  --   --   --   LATICACIDVEN 5.7* 6.3* 5.4* 4.8*    Recent Results (from the past 240 hour(s))  Resp Panel by RT-PCR (Flu A&B, Covid) Nasopharyngeal Swab  Status: None   Collection Time: 06/27/21 12:32 AM   Specimen: Nasopharyngeal Swab; Nasopharyngeal(NP) swabs in vial transport medium  Result Value Ref Range Status   SARS Coronavirus 2 by RT PCR NEGATIVE NEGATIVE Final    Comment: (NOTE) SARS-CoV-2 target nucleic acids are NOT DETECTED.  The SARS-CoV-2 RNA is generally detectable in upper respiratory specimens during the acute phase of infection. The lowest concentration of SARS-CoV-2 viral copies this assay can detect is 138 copies/mL. A negative result does not preclude SARS-Cov-2 infection and should not be used as the sole basis for treatment or other patient management decisions. A negative result may occur with  improper specimen collection/handling, submission of specimen other than nasopharyngeal swab, presence of viral mutation(s) within the areas targeted by this assay, and inadequate number of viral copies(<138 copies/mL). A negative result must be combined with clinical observations, patient history, and epidemiological information. The expected result is Negative.  Fact Sheet for Patients:   EntrepreneurPulse.com.au  Fact Sheet for Healthcare Providers:  IncredibleEmployment.be  This test is no t yet approved or cleared by the [place] FDA and  has been authorized for detection and/or diagnosis of SARS-CoV-2 by FDA under an Emergency Use Authorization (EUA). This EUA will remain  in effect (meaning this test can be used) for the duration of the COVID-19 declaration under Section 564(b)(1) of the Act, 21 U.S.C.section 360bbb-3(b)(1), unless the authorization is terminated  or revoked sooner.       Influenza A by PCR NEGATIVE NEGATIVE Final   Influenza B by PCR NEGATIVE NEGATIVE Final    Comment: (NOTE) The Xpert Xpress SARS-CoV-2/FLU/RSV plus assay is intended as an aid in the diagnosis of influenza from Nasopharyngeal swab specimens and should not be used as a sole basis for treatment. Nasal washings and aspirates are unacceptable for Xpert Xpress SARS-CoV-2/FLU/RSV testing.  Fact Sheet for Patients: EntrepreneurPulse.com.au  Fact Sheet for Healthcare Providers: IncredibleEmployment.be  This test is not yet approved or cleared by the [place] FDA and has been authorized for detection and/or diagnosis of SARS-CoV-2 by FDA under an Emergency Use Authorization (EUA). This EUA will remain in effect (meaning this test can be used) for the duration of the COVID-19 declaration under Section 564(b)(1) of the Act, 21 U.S.C. section 360bbb-3(b)(1), unless the authorization is terminated or revoked.  Performed at [facility], [address] [place], [place] [address]   Blood culture (routine x 2)     Status: None (Preliminary result)   Collection Time: 06/27/21 12:43 AM   Specimen: BLOOD  Result Value Ref Range Status   Specimen Description BLOOD RIGHT ASSIST CONTROL  Final   Special Requests   Final    BOTTLES DRAWN AEROBIC AND ANAEROBIC Blood Culture results may not be  optimal due to an inadequate volume of blood received in culture bottles   Culture   Final    NO GROWTH < 12 HOURS Performed at [facility], [address] [place], [place] [address]    Report Status PENDING  Incomplete  Blood culture (routine x 2)     Status: None (Preliminary result)   Collection Time: 06/27/21  1:45 AM   Specimen: BLOOD  Result Value Ref Range Status   Specimen Description BLOOD RIGHT ASSIST CONTROL  Final   Special Requests   Final    BOTTLES DRAWN AEROBIC AND ANAEROBIC Blood Culture adequate volume   Culture   Final    NO GROWTH < 12 HOURS Performed at [facility], [address] [place], [place] [address]    Report Status PENDING  Incomplete         Radiology Studies: US RENAL  Result Date: 06/27/2021 CLINICAL DATA:  Acute renal failure EXAM: RENAL / URINARY TRACT ULTRASOUND COMPLETE COMPARISON:  12/15/2019 abdominal CT FINDINGS: Right Kidney: Renal measurements: 13 x 6 x 6 cm = volume: 240 mL. Moderate hydronephrosis, similar to comparison CT. There is a shadowing stone at the lower pole measuring 13 mm. Left Kidney: Renal measurements: 13 x 8 x 7 cm = volume: 360 mL. Mild hydronephrosis, new from comparison study. Branching stone at the upper pole which is underestimated compared to comparison CT. No evidence of mass Bladder: History of Foley catheter with complete bladder decompression. IMPRESSION: 1. Moderate right hydronephrosis that is stable from January 2021 CT. 2. Mild left hydronephrosis which is new from 2021. 3. Bilateral nephrolithiasis. 4. Completely drained bladder. Electronically Signed   By: Monte Fantasia M.D.   On: 06/27/2021 06:13   DG Chest Portable 1 View  Result Date: 06/27/2021 CLINICAL DATA:  Shortness of breath EXAM: PORTABLE CHEST 1 VIEW COMPARISON:  01/27/2014 FINDINGS: Shallow lung inflation. Consolidation or atelectasis at the left lung base. Linear right basilar atelectasis. No pneumothorax. Suspect small left  pleural effusion. Normal cardiomediastinal contours. IMPRESSION: Shallow lung inflation with left basilar consolidation or atelectasis and suspected small left pleural effusion. Electronically Signed   By: Ulyses Jarred M.D.   On: 06/27/2021 01:27        Scheduled Meds:  amLODipine  10 mg Oral Daily   heparin  5,000 Units Subcutaneous Q8H   insulin aspart  0-6 Units Subcutaneous Q4H   metoprolol tartrate  25 mg Oral BID   Continuous Infusions:  azithromycin Stopped (06/27/21 0448)   cefTRIAXone (ROCEPHIN)  IV Stopped (06/27/21 0318)   sodium bicarbonate in 0.45 NS mL infusion 100 mL/hr at 06/27/21 0448     LOS: 0 days    Time spent: ICU 34 minutes    Sharen Hones, MD [facility]   To contact the attending provider between 7A-7P or the covering provider during after hours 7P-7A, please log into the web site [email or web address] and access using universal [place] password for that web site. If you do not have the password, please call the hospital operator.  06/27/2021, 9:00 AM

## 2021-06-27 NOTE — ED Notes (Signed)
Lunch tray delivered by dietary.

## 2021-06-27 NOTE — ED Notes (Signed)
Pt consumed 4 bites of her meal - stating "shes not hungry".

## 2021-06-27 NOTE — Progress Notes (Signed)
CODE SEPSIS - PHARMACY COMMUNICATION  **Broad Spectrum Antibiotics should be administered within 1 hour of Sepsis diagnosis**  Time Code Sepsis Called/Page Received: 8/2 @ 4193   Antibiotics Ordered: Ceftriaxone 2 gm IV X 1   Time of 1st antibiotic administration: Ceftriaxone 2 gm IV X 1 on 8/2 @ 0229.   Additional action taken by pharmacy:   If necessary, Name of Provider/Nurse Contacted:     Gavynn Duvall D ,PharmD Clinical Pharmacist  06/27/2021  2:33 AM

## 2021-06-27 NOTE — ED Triage Notes (Signed)
pneumonia on friday, crackles in lungs; difficulty breahting, 88% on RA pt on 10L non rebreather; 94% now gave 125mg  solumedrol. 20g LAC; cbg 180

## 2021-06-27 NOTE — ED Notes (Signed)
Dr. Roosevelt Locks @ the bedside.

## 2021-06-27 NOTE — ED Provider Notes (Signed)
[facility] Emergency Department Provider Note  ____________________________________________  Time seen: Approximately 12:36 AM  I have reviewed the triage vital signs and the nursing notes.   HISTORY  Chief Complaint Shortness of Breath and Respiratory Distress   HPI Heather Lopez is a 75 y.o. female with a history of hypertension, hyperlipidemia, diabetes who presents for evaluation of shortness of breath.  Patient reports 5 days of cough, congestion, loss of appetite, nausea, dry heaving, fatigue, malaise, body aches.  Saw her PCP 3 days ago with a negative COVID and flu.  She was diagnosed with pneumonia and put on Levaquin and steroids.  Patient reports that she has had progressively worsening shortness of breath and this evening the shortness of breath became severe.  She denies chest pain, vomiting, diarrhea.  Denies any personal or family history of PE or DVT, recent travel or immobilization, leg pain or swelling, hemoptysis, or exogenous hormones.  Endorses compliance with her antibiotics.  Per EMS patient was found satting 88% on room air and transported on nonrebreather.  She received 125 mg of Solu-Medrol  Past Medical History:  Diagnosis Date   Anxiety    Cataract    Depression    Diabetes mellitus without complication ([place])    Gout    History of kidney stones    Hyperlipidemia    Hypertension    Vertigo     Patient Active Problem List   Diagnosis Date Noted   Non-proliferative diabetic retinopathy, mild, both eyes ([place]) 03/03/2018   Vitamin D deficiency 08/21/2016   IBS (irritable bowel syndrome) 08/21/2016   Special screening for malignant neoplasms, colon    Benign neoplasm of transverse colon    Benign neoplasm of sigmoid colon    Uncontrolled type 2 diabetes mellitus with insulin therapy ([place]) 07/12/2015   Benign hypertension with CKD (chronic kidney disease) stage III ([place]) 07/12/2015   Hyperlipidemia associated with type 2 diabetes  mellitus ([place]) 07/12/2015   Depression 07/12/2015   BMI 40.0-44.9, adult ([place]) 07/12/2015    Past Surgical History:  Procedure Laterality Date   CATARACT EXTRACTION, BILATERAL Bilateral 2019   COLONOSCOPY WITH PROPOFOL N/A 08/19/2015   Procedure: COLONOSCOPY WITH PROPOFOL;  Surgeon: Lucilla Lame, MD;  Location: [place];  Service: Endoscopy;  Laterality: N/A;  diabetic - insulin   CYSTOSCOPY W/ RETROGRADES Bilateral 12/22/2019   Procedure: CYSTOSCOPY WITH RETROGRADE PYELOGRAM;  Surgeon: Abbie Sons, MD;  Location: [facility];  Service: Urology;  Laterality: Bilateral;   CYSTOSCOPY WITH BIOPSY N/A 12/22/2019   Procedure: CYSTOSCOPY WITH bladder BIOPSY;  Surgeon: Abbie Sons, MD;  Location: [facility];  Service: Urology;  Laterality: N/A;   CYSTOSCOPY WITH STENT PLACEMENT Right 12/22/2019   Procedure: CYSTOSCOPY WITH STENT PLACEMENT;  Surgeon: Abbie Sons, MD;  Location: [facility];  Service: Urology;  Laterality: Right;   CYSTOSCOPY WITH URETEROSCOPY Bilateral 12/22/2019   Procedure: CYSTOSCOPY WITH URETEROSCOPY;  Surgeon: Abbie Sons, MD;  Location: [facility];  Service: Urology;  Laterality: Bilateral;   CYSTOSCOPY/URETEROSCOPY/HOLMIUM LASER/STENT PLACEMENT Right 12/22/2019   Procedure: CYSTOSCOPY/URETEROSCOPY/HOLMIUM LASER/STENT PLACEMENT;  Surgeon: Abbie Sons, MD;  Location: [facility];  Service: Urology;  Laterality: Right;   EYE SURGERY     POLYPECTOMY  08/19/2015   Procedure: POLYPECTOMY;  Surgeon: Lucilla Lame, MD;  Location: [place];  Service: Endoscopy;;   TUBAL LIGATION  1992    Prior to Admission medications   Medication Sig Start Date End Date Taking? Authorizing Provider  amLODipine (  NORVASC) 10 MG tablet Take 1 tablet (10 mg total) by mouth daily. 04/15/18  Yes Mikey College, NP  aspirin EC 81 MG tablet Take 81 mg by mouth daily.   Yes [provider]  atorvastatin (LIPITOR) 20 MG tablet Take 1 tablet (20 mg total) by mouth  daily. 04/15/18  Yes Mikey College, NP  benzonatate (TESSALON) 200 MG capsule Take 200 mg by mouth 3 (three) times daily as needed. 06/23/21  Yes [provider]  buPROPion (WELLBUTRIN XL) 150 MG 24 hr tablet Take 1 tablet (150 mg total) by mouth daily. 05/26/18  Yes Mikey College, NP  insulin aspart (NOVOLOG FLEXPEN) 100 UNIT/ML FlexPen Use meal coverage 6 units with every meal plus correction scale three times daily up to total of 35 units per day. 04/15/18  Yes Mikey College, NP  Insulin Pen Needle 32G X 4 MM MISC Inject insulin pens up to 5 times daily 04/15/18  Yes Mikey College, NP  Lancets (FREESTYLE) lancets USE AS INSTRUCTED 06/16/19  Yes Karamalegos, Alexander J, DO  LEVEMIR FLEXTOUCH 100 UNIT/ML Pen INJECT 50 UNITS IN THE MORNING AND 44 UNITS IN THE EVENING Patient taking differently: Inject 52 Units into the skin 2 (two) times daily. 06/30/18  Yes Mikey College, NP  levofloxacin (LEVAQUIN) 500 MG tablet Take 500 mg by mouth daily. 06/23/21  Yes [provider]  losartan (COZAAR) 50 MG tablet Take 1 tablet (50 mg total) by mouth daily. 04/15/18  Yes Mikey College, NP  metFORMIN (GLUCOPHAGE) 1000 MG tablet Take 1 tablet (1,000 mg total) by mouth 2 (two) times daily. 04/15/18  Yes Mikey College, NP  metoprolol tartrate (LOPRESSOR) 25 MG tablet Take 1 tablet (25 mg total) by mouth 2 (two) times daily. 05/26/18  Yes Mikey College, NP  Multiple Vitamin (MULTI-VITAMIN) tablet Take 1 tablet by mouth daily.    Yes [provider]  ondansetron (ZOFRAN-ODT) 4 MG disintegrating tablet Take 4 mg by mouth every 8 (eight) hours as needed. 06/23/21  Yes [provider]  predniSONE (DELTASONE) 20 MG tablet Take 20 mg by mouth 2 (two) times daily. 06/23/21  Yes [provider]  TRULICITY 2.69 SW/5.4OE SOPN SMARTSIG:0.5 Milliliter(s) SUB-Q Once a Week 06/19/21  Yes [provider]    Allergies Contrast  media  [iodinated diagnostic agents] and Iodine  Family History  Problem Relation Age of Onset   Diabetes Father    Cancer Father        lung cancer   Diabetes Brother    Stroke Mother     Social History Social History   Tobacco Use   Smoking status: Former    Packs/day: 0.75    Years: 6.00    Pack years: 4.50    Types: Cigarettes    Quit date: 1973    Years since quitting: 49.6   Smokeless tobacco: Never   Tobacco comments:    quit 1973  Vaping Use   Vaping Use: Never used  Substance Use Topics   Alcohol use: No    Alcohol/week: 0.0 standard drinks   Drug use: No    Review of Systems  Constitutional: + fever, body aches Eyes: Negative for visual changes. ENT: Negative for sore throat. Neck: No neck pain  Cardiovascular: Negative for chest pain. Respiratory: + shortness of breath, cough Gastrointestinal: Negative for abdominal pain, vomiting or diarrhea. Genitourinary: Negative for dysuria. Musculoskeletal: Negative for back pain. Skin: Negative for rash. Neurological: Negative for headaches,  weakness or numbness. Psych: No SI or HI  ____________________________________________   PHYSICAL EXAM:  VITAL SIGNS: ED Triage Vitals  Enc Vitals Group     BP --      Pulse --      Resp --      Temp --      Temp src --      SpO2 06/27/21 0027 97 %     Weight 06/27/21 0028 220 lb (99.8 kg)     Height 06/27/21 0028 5\' 2"  (1.575 m)     Head Circumference --      Peak Flow --      Pain Score 06/27/21 0028 0     Pain Loc --      Pain Edu? --      Excl. in [place]? --     Constitutional: Alert and oriented, moderate respiratory distress HEENT:      Head: Normocephalic and atraumatic.         Eyes: Conjunctivae are normal. Sclera is non-icteric.       Mouth/Throat: Mucous membranes are moist.       Neck: Supple with no signs of meningismus. Cardiovascular: Regular rate and rhythm. No murmurs, gallops, or rubs. 2+ symmetrical distal pulses are present in all  extremities. No JVD. Respiratory: Increased work of breathing with abdominal retractions, hypoxic, crackles in bilateral bases Gastrointestinal: Soft, non tender, and non distended. Musculoskeletal:  No edema, cyanosis, or erythema of extremities. Neurologic: Normal speech and language. Face is symmetric. Moving all extremities. No gross focal neurologic deficits are appreciated. Skin: Skin is warm, dry and intact. No rash noted. Psychiatric: Mood and affect are normal. Speech and behavior are normal.  ____________________________________________   LABS (all labs ordered are listed, but only abnormal results are displayed)  Labs Reviewed  CBC WITH DIFFERENTIAL/PLATELET - Abnormal; Notable for the following components:      Result Value   WBC 24.4 (*)    RBC 3.69 (*)    Hemoglobin 10.4 (*)    HCT 30.9 (*)    Platelets 483 (*)    Neutro Abs 21.1 (*)    Monocytes Absolute 1.3 (*)    Abs Immature Granulocytes 0.35 (*)    All other components within normal limits  BASIC METABOLIC PANEL - Abnormal; Notable for the following components:   Sodium 124 (*)    Potassium 7.5 (*)    Chloride 93 (*)    CO2 9 (*)    Glucose, Bld 206 (*)    BUN 154 (*)    Creatinine, Ser 9.32 (*)    Calcium 8.3 (*)    GFR, Estimated 4 (*)    Anion gap 22 (*)    All other components within normal limits  LACTIC ACID, PLASMA - Abnormal; Notable for the following components:   Lactic Acid, Venous 5.7 (*)    All other components within normal limits  BLOOD GAS, VENOUS - Abnormal; Notable for the following components:   pH, Ven 7.15 (*)    pCO2, Ven 21 (*)    pO2, Ven 56.0 (*)    Bicarbonate 7.3 (*)    Acid-base deficit 19.8 (*)    All other components within normal limits  BRAIN NATRIURETIC PEPTIDE - Abnormal; Notable for the following components:   B Natriuretic Peptide 141.4 (*)    All other components within normal limits  PROTIME-INR - Abnormal; Notable for the following components:   Prothrombin  Time 17.2 (*)    INR 1.4 (*)  All other components within normal limits  TROPONIN I (HIGH SENSITIVITY) - Abnormal; Notable for the following components:   Troponin I (High Sensitivity) 28 (*)    All other components within normal limits  RESP PANEL BY RT-PCR (FLU A&B, COVID) ARPGX2  CULTURE, BLOOD (ROUTINE X 2)  CULTURE, BLOOD (ROUTINE X 2)  URINE CULTURE  PROCALCITONIN  APTT  LACTIC ACID, PLASMA  URINALYSIS, COMPLETE (UACMP) WITH MICROSCOPIC  TROPONIN I (HIGH SENSITIVITY)   ____________________________________________  EKG  ED ECG REPORT I, Rudene Re, the attending physician, personally viewed and interpreted this ECG.  Sinus rhythm with a rate of 76, first-degree AV block, normal QTC, no ST elevations or depressions ____________________________________________  RADIOLOGY  I have personally reviewed the images performed during this visit and I agree with the Radiologist's read.   Interpretation by Radiologist:  DG Chest Portable 1 View  Result Date: 06/27/2021 CLINICAL DATA:  Shortness of breath EXAM: PORTABLE CHEST 1 VIEW COMPARISON:  01/27/2014 FINDINGS: Shallow lung inflation. Consolidation or atelectasis at the left lung base. Linear right basilar atelectasis. No pneumothorax. Suspect small left pleural effusion. Normal cardiomediastinal contours. IMPRESSION: Shallow lung inflation with left basilar consolidation or atelectasis and suspected small left pleural effusion. Electronically Signed   By: Ulyses Jarred M.D.   On: 06/27/2021 01:27     ____________________________________________   PROCEDURES  Procedure(s) performed:yes .1-3 Lead EKG Interpretation  Date/Time: 06/27/2021 12:40 AM Performed by: Rudene Re, MD Authorized by: Rudene Re, MD     Interpretation: non-specific     ECG rate assessment: normal     Rhythm: sinus rhythm     Ectopy: none     Conduction: abnormal     Abnormal conduction: 1st degree AV block     Critical  Care performed: yes  CRITICAL CARE Performed by: Rudene Re  ?  Total critical care time: 30 min  Critical care time was exclusive of separately billable procedures and treating other patients.  Critical care was necessary to treat or prevent imminent or life-threatening deterioration.  Critical care was time spent personally by me on the following activities: development of treatment plan with patient and/or surrogate as well as nursing, discussions with consultants, evaluation of patient's response to treatment, examination of patient, obtaining history from patient or surrogate, ordering and performing treatments and interventions, ordering and review of laboratory studies, ordering and review of radiographic studies, pulse oximetry and re-evaluation of patient's condition.  ____________________________________________   INITIAL IMPRESSION / ASSESSMENT AND PLAN / ED COURSE  75 y.o. female with a history of hypertension, hyperlipidemia, diabetes who presents for evaluation of shortness of breath.  Patient is on day 3 of Levaquin for pneumonia with progressively worsening shortness of breath.  Had a COVID and flu negative test 5 days ago.  She arrives in moderate respiratory distress with abdominal retractions, hypoxic requiring CPAP per EMS.  Patient was placed on BiPAP upon arrival to the emergency room.  Patient received Solu-Medrol per EMS.  No history of COPD or asthma.  She does have crackles bilaterally.  We will repeat COVID and flu swab, will get a chest x-ray to evaluate for pneumonia, will check basic labs.  Anticipate admission to the hospital.  Old medical records reviewed including PCPs note, COVID and flu results, and labs done by PCP which showed WBC 19.7 with left shift.  I am unfortunately unable to see the chest x-ray done as an outpatient.  _________________________ 1:44 AM on 06/27/2021 ----------------------------------------- Patient is labs showing acute on  chronic kidney failure with a creatinine of 9.32.  Patient is hyperkalemic with a potassium of 7.5.  EKG with normal intervals.  Also hyponatremic with a sodium of 124.  She has an anion gap of 22.  A white count of 24.4 with a left shift, lactic of 5.7 and a VBG showing pH of 7.15 consistent with sepsis.  Chest x-ray showing left consolidation.  Patient was started on IVF resuscitation, also given IV bicarbonate, D50, insulin, albuterol, Kayexalate.  Will place Foley catheter for close monitoring of I&Os. Covid and flu negative. Will give rocephin and azithromycin.  Troponin slightly elevated at 2018 concerning for possible demand ischemia. Dr. Juleen China from nephrology aware of patient's condition, recommended initiating bicarbonate drip, continuing medical resuscitation and repeat labs in the am. Dr. Juleen China did not feel like patient needed emergent dialysis overnight since she is making some urine and has no EKG changes.    _____________________________________________ Please note:  Patient was evaluated in Emergency Department today for the symptoms described in the history of present illness. Patient was evaluated in the context of the global COVID-19 pandemic, which necessitated consideration that the patient might be at risk for infection with the SARS-CoV-2 virus that causes COVID-19. Institutional protocols and algorithms that pertain to the evaluation of patients at risk for COVID-19 are in a state of rapid change based on information released by regulatory bodies including the CDC and federal and state organizations. These policies and algorithms were followed during the patient's care in the ED.  Some ED evaluations and interventions may be delayed as a result of limited staffing during the pandemic.   [employer] Controlled Substance Database was reviewed by me. ____________________________________________   FINAL CLINICAL IMPRESSION(S) / ED DIAGNOSES   Final diagnoses:  Acute respiratory failure  with hypoxia (HCC)  Acute renal failure superimposed on chronic kidney disease, unspecified CKD stage, unspecified acute renal failure type (HCC)  Hyperkalemia  Sepsis, due to unspecified organism, unspecified whether acute organ dysfunction present [facility])  Demand ischemia of myocardium ([place])  Hyponatremia      NEW MEDICATIONS STARTED DURING THIS VISIT:  ED Discharge Orders     None        Note:  This document was prepared using Dragon voice recognition software and may include unintentional dictation errors.    Rudene Re, MD 06/27/21 [id number]

## 2021-06-28 ENCOUNTER — Other Ambulatory Visit: Payer: Self-pay

## 2021-06-28 ENCOUNTER — Other Ambulatory Visit (INDEPENDENT_AMBULATORY_CARE_PROVIDER_SITE_OTHER): Payer: Self-pay | Admitting: Vascular Surgery

## 2021-06-28 DIAGNOSIS — N189 Chronic kidney disease, unspecified: Secondary | ICD-10-CM

## 2021-06-28 DIAGNOSIS — J9601 Acute respiratory failure with hypoxia: Secondary | ICD-10-CM

## 2021-06-28 DIAGNOSIS — I248 Other forms of acute ischemic heart disease: Secondary | ICD-10-CM

## 2021-06-28 DIAGNOSIS — N179 Acute kidney failure, unspecified: Secondary | ICD-10-CM

## 2021-06-28 DIAGNOSIS — A419 Sepsis, unspecified organism: Secondary | ICD-10-CM | POA: Diagnosis not present

## 2021-06-28 LAB — CBC WITH DIFFERENTIAL/PLATELET
Abs Immature Granulocytes: 0.28 10*3/uL — ABNORMAL HIGH (ref 0.00–0.07)
Basophils Absolute: 0 10*3/uL (ref 0.0–0.1)
Basophils Relative: 0 %
Eosinophils Absolute: 0 10*3/uL (ref 0.0–0.5)
Eosinophils Relative: 0 %
HCT: 25.7 % — ABNORMAL LOW (ref 36.0–46.0)
Hemoglobin: 8.8 g/dL — ABNORMAL LOW (ref 12.0–15.0)
Immature Granulocytes: 1 %
Lymphocytes Relative: 2 %
Lymphs Abs: 0.4 10*3/uL — ABNORMAL LOW (ref 0.7–4.0)
MCH: 26.8 pg (ref 26.0–34.0)
MCHC: 34.2 g/dL (ref 30.0–36.0)
MCV: 78.4 fL — ABNORMAL LOW (ref 80.0–100.0)
Monocytes Absolute: 1.2 10*3/uL — ABNORMAL HIGH (ref 0.1–1.0)
Monocytes Relative: 6 %
Neutro Abs: 18.3 10*3/uL — ABNORMAL HIGH (ref 1.7–7.7)
Neutrophils Relative %: 91 %
Platelets: 317 10*3/uL (ref 150–400)
RBC: 3.28 MIL/uL — ABNORMAL LOW (ref 3.87–5.11)
RDW: 14.7 % (ref 11.5–15.5)
WBC: 20.2 10*3/uL — ABNORMAL HIGH (ref 4.0–10.5)
nRBC: 0 % (ref 0.0–0.2)

## 2021-06-28 LAB — COMPREHENSIVE METABOLIC PANEL
ALT: 16 U/L (ref 0–44)
AST: 13 U/L — ABNORMAL LOW (ref 15–41)
Albumin: 2.5 g/dL — ABNORMAL LOW (ref 3.5–5.0)
Alkaline Phosphatase: 62 U/L (ref 38–126)
Anion gap: 18 — ABNORMAL HIGH (ref 5–15)
BUN: 163 mg/dL — ABNORMAL HIGH (ref 8–23)
CO2: 16 mmol/L — ABNORMAL LOW (ref 22–32)
Calcium: 7.6 mg/dL — ABNORMAL LOW (ref 8.9–10.3)
Chloride: 96 mmol/L — ABNORMAL LOW (ref 98–111)
Creatinine, Ser: 8.66 mg/dL — ABNORMAL HIGH (ref 0.44–1.00)
GFR, Estimated: 4 mL/min — ABNORMAL LOW (ref >60)
Glucose, Bld: 264 mg/dL — ABNORMAL HIGH (ref 70–99)
Potassium: 4.8 mmol/L (ref 3.5–5.1)
Sodium: 130 mmol/L — ABNORMAL LOW (ref 135–145)
Total Bilirubin: 0.5 mg/dL (ref 0.3–1.2)
Total Protein: 6.1 g/dL — ABNORMAL LOW (ref 6.5–8.1)

## 2021-06-28 LAB — KAPPA/LAMBDA LIGHT CHAINS
Kappa free light chain: 61.5 mg/L — ABNORMAL HIGH (ref 3.3–19.4)
Kappa, lambda light chain ratio: 1.65 (ref 0.26–1.65)
Lambda free light chains: 37.2 mg/L — ABNORMAL HIGH (ref 5.7–26.3)

## 2021-06-28 LAB — GLUCOSE, CAPILLARY
Glucose-Capillary: 244 mg/dL — ABNORMAL HIGH (ref 70–99)
Glucose-Capillary: 252 mg/dL — ABNORMAL HIGH (ref 70–99)
Glucose-Capillary: 253 mg/dL — ABNORMAL HIGH (ref 70–99)
Glucose-Capillary: 267 mg/dL — ABNORMAL HIGH (ref 70–99)
Glucose-Capillary: 298 mg/dL — ABNORMAL HIGH (ref 70–99)
Glucose-Capillary: 382 mg/dL — ABNORMAL HIGH (ref 70–99)

## 2021-06-28 LAB — PROCALCITONIN: Procalcitonin: 5.48 ng/mL

## 2021-06-28 LAB — URINE CULTURE: Culture: 30000 — AB

## 2021-06-28 MED ORDER — FLUCONAZOLE 50 MG PO TABS
150.0000 mg | ORAL_TABLET | Freq: Once | ORAL | Status: AC
  Administered 2021-06-28: 150 mg via ORAL
  Filled 2021-06-28: qty 1

## 2021-06-28 MED ORDER — LOPERAMIDE HCL 2 MG PO CAPS
2.0000 mg | ORAL_CAPSULE | Freq: Four times a day (QID) | ORAL | Status: DC | PRN
  Administered 2021-06-28: 2 mg via ORAL
  Filled 2021-06-28: qty 1

## 2021-06-28 MED ORDER — INSULIN DETEMIR 100 UNIT/ML ~~LOC~~ SOLN
25.0000 [IU] | Freq: Two times a day (BID) | SUBCUTANEOUS | Status: DC
  Administered 2021-06-28 – 2021-06-29 (×3): 25 [IU] via SUBCUTANEOUS
  Filled 2021-06-28 (×5): qty 0.25

## 2021-06-28 NOTE — Progress Notes (Addendum)
PROGRESS NOTE    Heather Lopez  OIZ:[id number] DOB: 1945/12/31 DOA: 06/27/2021 PCP: [name], PA-C   Chief complaint.  Generalized weakness. Brief Narrative:  Heather Lopez is a 75 y.o. female with history of hypertension, diabetes mellitus, hyperlipidemia has been feeling weak for the last 3 to 4 days.  Had gone to her primary care about 4 days ago was prescribed Levaquin and prednisone for possible pneumonia.  Despite taking which patient was still feeling weak and having poor appetite unable to eat anything.  Had 1 episode of vomiting. Upon arriving the emergency room, patient had a creatinine of 9.3, potassium was 7.5, sodium 124, pH is 7.15 with bicarb of 7.3.  Lactic acid peaked to 6.3, procalcitonin level 6.83.  White cell 24.4, platelets 483.  Patient also had INR 1.4.  Urine started showed a large amount of leukocytes.  Blood culture was sent out, urine culture also sent. Patient was given bicarb drip, nephrology consult obtained for acute renal failure with severe hyperkalemia. Patient was also placed on Zithromax and Rocephin for UTI. Patient was also placed on BiPAP overnight for worsening short of breath and acute hypoxemic respiratory failure.  Assessment & Plan:   Principal Problem:   ARF (acute renal failure) (HCC) Active Problems:   Uncontrolled type 2 diabetes mellitus with insulin therapy ([place])   Hyperlipidemia associated with type 2 diabetes mellitus ([place])   Acute respiratory failure with hypoxemia (HCC)   Severe sepsis with septic shock ([place])   Acute lower UTI   Acute renal failure (ARF) ([place])  #1.  Septic shock secondary to UTI. Acute hypoxemic respiratory failure on BiPAP. Urinary tract infection -urine culture growing 30,000 colonies of yeast Left lower lobe pneumonia. Patient had a tachycardia, severe leukocytosis, elevated lactic acid level at 6.3.  Procalcitonin level 6.83-> 5.48.  She met septic shock criteria on admission.  Given IV fluids per sepsis  protocol.  Her lactic acid came down to 4.8.   Continue IV Zithromax and Rocephin for pneumonia, this also will cover UTI.[name]  Urine culture growing 30,000 colonies of yeast.  Will give 1 dose of Diflucan on 8/3 Due to significant hyponatremia and renal failure, I will also check a Legionella antigen. Patient also developed acute respiratory failure most likely secondary to septic shock.  Required BiPAP day before but now weaned off to 2 L oxygen via nasal cannula  2. Acute renal failure with underlying CKD stage IV. Severe metabolic acidosis. Severe hyperkalemia. Hyponatremia. Nephrology planning to start dialysis once permacath placed.  Vascular Dr. Delana Meyer planning for this tomorrow  #3. Anemia of chronic kidney disease. Thrombocytosis secondary to sepsis. Hemoglobin 8.8 today Vitamin B-12 -> 192, Iron 41  4. Type 2 diabetes. Continue sliding scale insulin.  Add insulin Levemir 25 units subcu twice daily for better blood sugar control  5. Essential hypertension. Blood pressure slowly creeping up so added Norvasc and Lopressor for better blood pressure control  6.  Baseline ambulation Status: -According to husband patient has severe hip problems and waiting for surgery on her right hip which was recently scheduled for August 24 but now is canceled.  She also has difficulty with her left hip and at some point may need surgery.   -She has been having ambulation difficulty over several months according to husband and would walk with a cane and then her husband would help her move around into wheelchair  7.  Diarrhea -We will try Imodium as needed.  Please note these are not loose  watery/smelly so not concerning for C. difficile at this time   DVT prophylaxis: Heparin Code Status: Full code Family Communication: husband updated over phone 8/3 Disposition Plan:   Status is: Inpatient  Remains inpatient appropriate because:Hemodynamically unstable, Ongoing active pain requiring  inpatient pain management, IV treatments appropriate due to intensity of illness or inability to take PO, and Inpatient level of care appropriate due to severity of illness, now needing dialysis so will be longer length of stay  Dispo: The patient is from: Home              Anticipated d/c is to: Home with home health versus SNF              Patient currently is not medically stable to d/c.   Difficult to place patient Yes   I/O last 3 completed shifts: In: 2943 [P.O.:490; I.V.:2103; IV Piggyback:350] Out: 1950 [WHQPR:9163] Total I/O In: -  Out: 8466 [ZLDJT:7017]     Consultants:  Nephrology Vascular surgery  Procedures: Permacath scheduled for tomorrow on 8/4 followed by dialysis  Antimicrobials: Rocephin and Zithromax.  Subjective: Complains of diarrhea.  Tired of feeling miserable and hoping that dialysis is temporary.  Objective: Vitals:   06/28/21 0344 06/28/21 0813 06/28/21 1157 06/28/21 1624  BP: (!) 152/64 (!) 150/59 (!) 122/56 (!) 148/58  Pulse: 85 83 79 76  Resp: '16 19 19 19  ' Temp: 98.8 F (37.1 C) 97.6 F (36.4 C) (!) 97.5 F (36.4 C) (!) 97.5 F (36.4 C)  TempSrc:  Oral Oral Oral  SpO2: 94% 93% 94% 95%  Weight:      Height:        Intake/Output Summary (Last 24 hours) at 06/28/2021 1701 Last data filed at 06/28/2021 1631 Gross per 24 hour  Intake 2103.02 ml  Output 2900 ml  Net -796.98 ml   Filed Weights   06/27/21 0028 06/28/21 0100  Weight: 99.8 kg 106.8 kg    Examination:  General exam: Appears calm and comfortable  Respiratory system: Clear to auscultation. Respiratory effort normal. Cardiovascular system: S1 & S2 heard, RRR. No JVD, murmurs, rubs, gallops or clicks. No pedal edema. Gastrointestinal system: Abdomen is nondistended, soft and nontender. No organomegaly or masses felt. Normal bowel sounds heard. Central nervous system: Alert and oriented x3.  No focal neurological deficits. Extremities: Symmetric 5 x 5 power. Skin: No rashes,  lesions or ulcers Psychiatry: Judgement and insight appear normal. Mood & affect appropriate.     Data Reviewed: I have personally reviewed following labs and imaging studies  CBC: Recent Labs  Lab 06/27/21 0032 06/27/21 0508 06/27/21 0713 06/28/21 0458  WBC 24.4* 19.4* 19.2* 20.2*  NEUTROABS 21.1*  --  17.9* 18.3*  HGB 10.4* 9.6* 8.9* 8.8*  HCT 30.9* 28.7* 26.4* 25.7*  MCV 83.7 82.5 83.8 78.4*  PLT 483* 381 343 793   Basic Metabolic Panel: Recent Labs  Lab 06/27/21 0032 06/27/21 0508 06/27/21 0713 06/27/21 0911 06/27/21 1459 06/27/21 2231 06/28/21 0458  NA 124*  --  130*  --  126* 129* 130*  K 7.5*  --  6.5*  --  5.9* 5.0 4.8  CL 93*  --  97*  --  95* 96* 96*  CO2 9*  --  11*  --  11* 14* 16*  GLUCOSE 206*  --  187*  --  224* 261* 264*  BUN 154*  --  136*  --  194* 170* 163*  CREATININE 9.32* 9.14* 9.37*  --  9.44* 9.14* 8.66*  CALCIUM 8.3*  --  7.9*  --  7.8* 7.7* 7.6*  MG  --   --   --  1.7  --   --   --   PHOS  --   --   --   --  9.5* 9.1*  --    GFR: Estimated Creatinine Clearance: 6.6 mL/min (A) (by C-G formula based on SCr of 8.66 mg/dL (H)). Liver Function Tests: Recent Labs  Lab 06/27/21 0713 06/27/21 1459 06/27/21 2231 06/28/21 0458  AST 20  --   --  13*  ALT 18  --   --  16  ALKPHOS 56  --   --  62  BILITOT 0.5  --   --  0.5  PROT 6.4*  --   --  6.1*  ALBUMIN 2.6* 2.7* 2.6* 2.5*   No results for input(s): LIPASE, AMYLASE in the last 168 hours. No results for input(s): AMMONIA in the last 168 hours. Coagulation Profile: Recent Labs  Lab 06/27/21 0032  INR 1.4*   Cardiac Enzymes: No results for input(s): CKTOTAL, CKMB, CKMBINDEX, TROPONINI in the last 168 hours. BNP (last 3 results) No results for input(s): PROBNP in the last 8760 hours. HbA1C: No results for input(s): HGBA1C in the last 72 hours. CBG: Recent Labs  Lab 06/27/21 2359 06/28/21 0407 06/28/21 0814 06/28/21 1202 06/28/21 1626  GLUCAP 252* 253* 244* 267* 298*    Lipid Profile: No results for input(s): CHOL, HDL, LDLCALC, TRIG, CHOLHDL, LDLDIRECT in the last 72 hours. Thyroid Function Tests: No results for input(s): TSH, T4TOTAL, FREET4, T3FREE, THYROIDAB in the last 72 hours. Anemia Panel: Recent Labs    06/27/21 0713 06/27/21 0911  VITAMINB12  --  192  TIBC 228*  --   IRON 41  --    Sepsis Labs: Recent Labs  Lab 06/27/21 0032 06/27/21 0239 06/27/21 0508 06/27/21 0713 06/28/21 0458  PROCALCITON 6.83  --   --   --  5.48  LATICACIDVEN 5.7* 6.3* 5.4* 4.8*  --     Recent Results (from the past 240 hour(s))  Resp Panel by RT-PCR (Flu A&B, Covid) Nasopharyngeal Swab     Status: None   Collection Time: 06/27/21 12:32 AM   Specimen: Nasopharyngeal Swab; Nasopharyngeal(NP) swabs in vial transport medium  Result Value Ref Range Status   SARS Coronavirus 2 by RT PCR NEGATIVE NEGATIVE Final    Comment: (NOTE) SARS-CoV-2 target nucleic acids are NOT DETECTED.  The SARS-CoV-2 RNA is generally detectable in upper respiratory specimens during the acute phase of infection. The lowest concentration of SARS-CoV-2 viral copies this assay can detect is 138 copies/mL. A negative result does not preclude SARS-Cov-2 infection and should not be used as the sole basis for treatment or other patient management decisions. A negative result may occur with  improper specimen collection/handling, submission of specimen other than nasopharyngeal swab, presence of viral mutation(s) within the areas targeted by this assay, and inadequate number of viral copies(<138 copies/mL). A negative result must be combined with clinical observations, patient history, and epidemiological information. The expected result is Negative.  Fact Sheet for Patients:  EntrepreneurPulse.com.au  Fact Sheet for Healthcare Providers:  IncredibleEmployment.be  This test is no t yet approved or cleared by the [place] FDA and  has been  authorized for detection and/or diagnosis of SARS-CoV-2 by FDA under an Emergency Use Authorization (EUA). This EUA will remain  in effect (meaning this test can be used) for the duration of the COVID-19 declaration under Section  564(b)(1) of the Act, 21 U.S.C.section 360bbb-3(b)(1), unless the authorization is terminated  or revoked sooner.       Influenza A by PCR NEGATIVE NEGATIVE Final   Influenza B by PCR NEGATIVE NEGATIVE Final    Comment: (NOTE) The Xpert Xpress SARS-CoV-2/FLU/RSV plus assay is intended as an aid in the diagnosis of influenza from Nasopharyngeal swab specimens and should not be used as a sole basis for treatment. Nasal washings and aspirates are unacceptable for Xpert Xpress SARS-CoV-2/FLU/RSV testing.  Fact Sheet for Patients: EntrepreneurPulse.com.au  Fact Sheet for Healthcare Providers: IncredibleEmployment.be  This test is not yet approved or cleared by the [place] FDA and has been authorized for detection and/or diagnosis of SARS-CoV-2 by FDA under an Emergency Use Authorization (EUA). This EUA will remain in effect (meaning this test can be used) for the duration of the COVID-19 declaration under Section 564(b)(1) of the Act, 21 U.S.C. section 360bbb-3(b)(1), unless the authorization is terminated or revoked.  Performed at [facility], [address] [place], [place] [address]   Blood culture (routine x 2)     Status: None (Preliminary result)   Collection Time: 06/27/21 12:43 AM   Specimen: BLOOD  Result Value Ref Range Status   Specimen Description BLOOD RIGHT ASSIST CONTROL  Final   Special Requests   Final    BOTTLES DRAWN AEROBIC AND ANAEROBIC Blood Culture results may not be optimal due to an inadequate volume of blood received in culture bottles   Culture   Final    NO GROWTH 1 DAY Performed at [facility], [address], [place], [place] [address]    Report Status  PENDING  Incomplete  Blood culture (routine x 2)     Status: None (Preliminary result)   Collection Time: 06/27/21  1:45 AM   Specimen: BLOOD  Result Value Ref Range Status   Specimen Description BLOOD RIGHT ASSIST CONTROL  Final   Special Requests   Final    BOTTLES DRAWN AEROBIC AND ANAEROBIC Blood Culture adequate volume   Culture   Final    NO GROWTH 1 DAY Performed at [facility], [address] [place], [place] [address]    Report Status PENDING  Incomplete  Urine Culture     Status: Abnormal   Collection Time: 06/27/21  2:39 AM   Specimen: In/Out Cath Urine  Result Value Ref Range Status   Specimen Description   Final    IN/OUT CATH URINE Performed at [facility], [address] [place], [place] [address]    Special Requests   Final    NONE Performed at [facility], [address] [place], [place] [address]    Culture 30,000 COLONIES/mL YEAST (A)  Final   Report Status 06/28/2021 FINAL  Final         Radiology Studies: US RENAL  Result Date: 06/27/2021 CLINICAL DATA:  Acute renal failure EXAM: RENAL / URINARY TRACT ULTRASOUND COMPLETE COMPARISON:  12/15/2019 abdominal CT FINDINGS: Right Kidney: Renal measurements: 13 x 6 x 6 cm = volume: 240 mL. Moderate hydronephrosis, similar to comparison CT. There is a shadowing stone at the lower pole measuring 13 mm. Left Kidney: Renal measurements: 13 x 8 x 7 cm = volume: 360 mL. Mild hydronephrosis, new from comparison study. Branching stone at the upper pole which is underestimated compared to comparison CT. No evidence of mass Bladder: History of Foley catheter with complete bladder decompression. IMPRESSION: 1. Moderate right hydronephrosis that is stable from  January 2021 CT. 2. Mild left hydronephrosis which is new from 2021. 3. Bilateral nephrolithiasis. 4. Completely drained bladder. Electronically Signed   By: Monte Fantasia M.D.   On: 06/27/2021 06:13   DG Chest Portable 1  View  Result Date: 06/27/2021 CLINICAL DATA:  Shortness of breath EXAM: PORTABLE CHEST 1 VIEW COMPARISON:  01/27/2014 FINDINGS: Shallow lung inflation. Consolidation or atelectasis at the left lung base. Linear right basilar atelectasis. No pneumothorax. Suspect small left pleural effusion. Normal cardiomediastinal contours. IMPRESSION: Shallow lung inflation with left basilar consolidation or atelectasis and suspected small left pleural effusion. Electronically Signed   By: Ulyses Jarred M.D.   On: 06/27/2021 01:27   ECHOCARDIOGRAM COMPLETE  Result Date: 06/27/2021    ECHOCARDIOGRAM REPORT   Patient Name:   Ashling E Finnie Date of Exam: 06/27/2021 Medical Rec #:  [id number]  Height:       62.0 in Accession #:    [id number] Weight:       220.0 lb Date of Birth:  10-04-1946   BSA:          1.991 m Patient Age:    53 years   BP:           130/74 mmHg Patient Gender: F          HR:           79 bpm. Exam Location:  [facility] Procedure: 2D Echo, Color Doppler and Cardiac Doppler Indications:     Fever R50.9  History:         Patient has no prior history of Echocardiogram examinations.                  Risk Factors:Hypertension and Diabetes.  Sonographer:     Sherrie Sport RDCS (AE) Referring Phys:  [id number] [facility] Diagnosing Phys: Nelva Bush MD  Sonographer Comments: Suboptimal apical window. IMPRESSIONS  1. Left ventricular ejection fraction, by estimation, is 55 to 60%. The left ventricle has normal function. Left ventricular endocardial border not optimally defined to evaluate regional wall motion. There is mild left ventricular hypertrophy. Left ventricular diastolic parameters were normal.  2. Right ventricular systolic function was not well visualized. The right ventricular size is normal. Tricuspid regurgitation signal is inadequate for assessing PA pressure.  3. Left atrial size was moderately dilated.  4. The mitral valve is abnormal. No evidence of mitral valve regurgitation. No evidence of mitral stenosis.  5.  The aortic valve is tricuspid. There is mild calcification of the aortic valve. There is mild thickening of the aortic valve. Aortic valve regurgitation is not visualized. Mild to moderate aortic valve sclerosis/calcification is present, without any evidence of aortic stenosis. FINDINGS  Left Ventricle: Left ventricular ejection fraction, by estimation, is 55 to 60%. The left ventricle has normal function. Left ventricular endocardial border not optimally defined to evaluate regional wall motion. The left ventricular internal cavity size was normal in size. There is mild left ventricular hypertrophy. Left ventricular diastolic parameters were normal. Right Ventricle: The right ventricular size is normal. Right vetricular wall thickness was not well visualized. Right ventricular systolic function was not well visualized. Tricuspid regurgitation signal is inadequate for assessing PA pressure. Left Atrium: Left atrial size was moderately dilated. Right Atrium: Right atrial size was normal in size. Pericardium: The pericardium was not well visualized. Mitral Valve: The mitral valve is abnormal. There is mild thickening of the mitral valve leaflet(s). No evidence of mitral valve regurgitation. No evidence of mitral valve stenosis. Tricuspid  Valve: The tricuspid valve is not well visualized. Tricuspid valve regurgitation is trivial. Aortic Valve: The aortic valve is tricuspid. There is mild calcification of the aortic valve. There is mild thickening of the aortic valve. Aortic valve regurgitation is not visualized. Mild to moderate aortic valve sclerosis/calcification is present, without any evidence of aortic stenosis. Aortic valve mean gradient measures 4.0 mmHg. Aortic valve peak gradient measures 6.9 mmHg. Aortic valve area, by VTI measures 2.21 cm. Pulmonic Valve: The pulmonic valve was not well visualized. Pulmonic valve regurgitation is not visualized. No evidence of pulmonic stenosis. Aorta: The aortic root is  normal in size and structure. Pulmonary Artery: The pulmonary artery is not well seen. Venous: The inferior vena cava was not well visualized. IAS/Shunts: The interatrial septum was not well visualized.  LEFT VENTRICLE PLAX 2D LVIDd:         4.85 cm  Diastology LVIDs:         3.21 cm  LV e' lateral:   11.20 cm/s LV PW:         1.11 cm  LV E/e' lateral: 7.8 LV IVS:        0.99 cm LVOT diam:     2.00 cm LV SV:         53 LV SV Index:   27 LVOT Area:     3.14 cm  RIGHT VENTRICLE RV Basal diam:  3.06 cm LEFT ATRIUM             Index       RIGHT ATRIUM           Index LA diam:        4.80 cm 2.41 cm/m  RA Area:     16.60 cm LA Vol (A2C):   98.8 ml 49.63 ml/m RA Volume:   45.70 ml  22.96 ml/m LA Vol (A4C):   69.4 ml 34.86 ml/m LA Biplane Vol: 89.2 ml 44.81 ml/m  AORTIC VALVE                   PULMONIC VALVE AV Area (Vmax):    2.04 cm    PV Vmax:        0.75 m/s AV Area (Vmean):   1.91 cm    PV Peak grad:   2.2 mmHg AV Area (VTI):     2.21 cm    RVOT Peak grad: 3 mmHg AV Vmax:           131.00 cm/s AV Vmean:          88.000 cm/s AV VTI:            0.239 m AV Peak Grad:      6.9 mmHg AV Mean Grad:      4.0 mmHg LVOT Vmax:         84.90 cm/s LVOT Vmean:        53.600 cm/s LVOT VTI:          0.168 m LVOT/AV VTI ratio: 0.70  AORTA Ao Root diam: 3.00 cm MITRAL VALVE MV Area (PHT): 9.37 cm    SHUNTS MV Decel Time: 81 msec     Systemic VTI:  0.17 m MV E velocity: 87.40 cm/s  Systemic Diam: 2.00 cm MV A velocity: 92.10 cm/s MV E/A ratio:  0.95 Harrell Gave End MD Electronically signed by Nelva Bush MD Signature Date/Time: 06/27/2021/5:01:47 PM    Final         Scheduled Meds:  amLODipine  10 mg Oral Daily  heparin  5,000 Units Subcutaneous Q8H   insulin aspart  0-6 Units Subcutaneous Q4H   insulin detemir  25 Units Subcutaneous BID   metoprolol tartrate  25 mg Oral BID   patiromer  25.2 g Oral Daily   sodium bicarbonate  1,300 mg Oral TID   Continuous Infusions:  azithromycin 500 mg (06/28/21 0215)    cefTRIAXone (ROCEPHIN)  IV 2 g (06/28/21 0216)     LOS: 1 day    Time spent: 22 minutes    Patton Rabinovich Manuella Ghazi, MD [facility]   To contact the attending provider between 7A-7P or the covering provider during after hours 7P-7A, please log into the web site [email or web address] and access using universal [place] password for that web site. If you do not have the password, please call the hospital operator.  06/28/2021, 5:01 PM

## 2021-06-28 NOTE — Plan of Care (Signed)

## 2021-06-28 NOTE — Progress Notes (Addendum)
Inpatient Diabetes Program Recommendations  AACE/ADA: New Consensus Statement on Inpatient Glycemic Control (2015)  Target Ranges:  Prepandial:   less than 140 mg/dL      Peak postprandial:   less than 180 mg/dL (1-2 hours)      Critically ill patients:  140 - 180 mg/dL   Results for Heather Lopez, Heather Lopez (MRN [id number]) as of 06/28/2021 08:51  Ref. Range 06/27/2021 23:59 06/28/2021 04:07 06/28/2021 08:14  Glucose-Capillary Latest Ref Range: 70 - 99 mg/dL 252 (H) 253 (H) 244 (H)    Home DM Meds: Novolog 6 units TID with meals + Correction       Levemir 52 units BID       Metformin 1000 mg BID       Trulicity 6.27 mg Qweek  Current Orders: Novolog 0-6 units Q4 hours      MD- Note CBGs >200 since 12pm yesterday  Takes Levemir Insulin at home  Please consider starting Levemir 25 units BID (50% home dose to start)     --Will follow patient during hospitalization--  Wyn Quaker RN, MSN, CDE Diabetes Coordinator Inpatient Glycemic Control Team Team Pager: [phone] (8a-5p)

## 2021-06-28 NOTE — Consult Note (Signed)
[facility] Vascular Consult Note  MRN : [id number]  Heather Lopez is a 75 y.o. (Jan 27, 1946) female who presents with chief complaint of  Chief Complaint  Patient presents with   Shortness of Breath   Respiratory Distress   History of Present Illness:  Heather Lopez is a 75 year old female with history of hypertension, diabetes mellitus, hyperlipidemia has been feeling weak for the last 3 to 4 days.    Had gone to her primary care about 4 days ago was prescribed Levaquin and prednisone for possible pneumonia.  Despite taking which patient was still feeling weak and having poor appetite unable to eat anything.  Had 1 episode of vomiting.  Denies chest pain or productive cough.   ED Course: In the ER patient appears generally weak.  Labs were showing creatinine of 9.3 which increased from in December 22, 2019.  Potassium was 5.5 sodium 124 bicarb of 9 procalcitonin of 6.8 lactic acid of 5.7 WBC 24.4 hemoglobin 10.4.  ER physician discussed with on-call nephrologist who advised patient to be placed on bicarb drip.  Patient also received fluid bolus and also received D50 IV insulin and sodium bicarb.  COVID test was negative.  Chest x-ray showing possible infiltrates for which patient was started on antibiotics.  While in the ER patient had 1 episode of diarrhea.  Lactic acid level was 5.7 worsened to 6.3.  Nephrology has been consulted and would like to initiate dialysis at this time however the patient does not have an adequate access.  Vascular surgery was consulted by Dr. Juleen China for possible PermCath placement.  Current Facility-Administered Medications  Medication Dose Route Frequency Provider Last Rate Last Admin   amLODipine (NORVASC) tablet 10 mg  10 mg Oral Daily Rise Patience, MD   10 mg at 06/28/21 0912   azithromycin (ZITHROMAX) 500 mg in sodium chloride 0.9 % 250 mL IVPB  500 mg Intravenous Q24H Rise Patience, MD 250 mL/hr at 06/28/21 0215 500 mg at  06/28/21 0215   cefTRIAXone (ROCEPHIN) 2 g in sodium chloride 0.9 % 100 mL IVPB  2 g Intravenous Q24H Rise Patience, MD 200 mL/hr at 06/28/21 0216 2 g at 06/28/21 0216   heparin injection 5,000 Units  5,000 Units Subcutaneous Q8H Rise Patience, MD   5,000 Units at 06/28/21 1238   insulin aspart (novoLOG) injection 0-6 Units  0-6 Units Subcutaneous Q4H Rise Patience, MD   2 Units at 06/28/21 4098   insulin detemir (LEVEMIR) injection 25 Units  25 Units Subcutaneous BID Max Sane, MD       metoprolol tartrate (LOPRESSOR) tablet 25 mg  25 mg Oral BID Rise Patience, MD   25 mg at 06/28/21 1191   patiromer Daryll Drown) packet 25.2 g  25.2 g Oral Daily Kolluru, Sarath, MD   25.2 g at 06/28/21 1238   sodium bicarbonate tablet 1,300 mg  1,300 mg Oral TID Lavonia Dana, MD   1,300 mg at 06/28/21 0912   Past Medical History:  Diagnosis Date   Anxiety    Cataract    Depression    Diabetes mellitus without complication ([place])    Gout    History of kidney stones    Hyperlipidemia    Hypertension    Vertigo    Past Surgical History:  Procedure Laterality Date   CATARACT EXTRACTION, BILATERAL Bilateral 2019   COLONOSCOPY WITH PROPOFOL N/A 08/19/2015   Procedure: COLONOSCOPY WITH PROPOFOL;  Surgeon: Lucilla Lame,  MD;  Location: [place];  Service: Endoscopy;  Laterality: N/A;  diabetic - insulin   CYSTOSCOPY W/ RETROGRADES Bilateral 12/22/2019   Procedure: CYSTOSCOPY WITH RETROGRADE PYELOGRAM;  Surgeon: Abbie Sons, MD;  Location: [facility];  Service: Urology;  Laterality: Bilateral;   CYSTOSCOPY WITH BIOPSY N/A 12/22/2019   Procedure: CYSTOSCOPY WITH bladder BIOPSY;  Surgeon: Abbie Sons, MD;  Location: [facility];  Service: Urology;  Laterality: N/A;   CYSTOSCOPY WITH STENT PLACEMENT Right 12/22/2019   Procedure: CYSTOSCOPY WITH STENT PLACEMENT;  Surgeon: Abbie Sons, MD;  Location: [facility];  Service: Urology;  Laterality: Right;   CYSTOSCOPY WITH  URETEROSCOPY Bilateral 12/22/2019   Procedure: CYSTOSCOPY WITH URETEROSCOPY;  Surgeon: Abbie Sons, MD;  Location: [facility];  Service: Urology;  Laterality: Bilateral;   CYSTOSCOPY/URETEROSCOPY/HOLMIUM LASER/STENT PLACEMENT Right 12/22/2019   Procedure: CYSTOSCOPY/URETEROSCOPY/HOLMIUM LASER/STENT PLACEMENT;  Surgeon: Abbie Sons, MD;  Location: [facility];  Service: Urology;  Laterality: Right;   EYE SURGERY     POLYPECTOMY  08/19/2015   Procedure: POLYPECTOMY;  Surgeon: Lucilla Lame, MD;  Location: [place];  Service: Endoscopy;;   TUBAL LIGATION  1992   Social History Social History   Tobacco Use   Smoking status: Former    Packs/day: 0.75    Years: 6.00    Pack years: 4.50    Types: Cigarettes    Quit date: 1973    Years since quitting: 49.6   Smokeless tobacco: Never   Tobacco comments:    quit 1973  Vaping Use   Vaping Use: Never used  Substance Use Topics   Alcohol use: No    Alcohol/week: 0.0 standard drinks   Drug use: No   Family History Family History  Problem Relation Age of Onset   Diabetes Father    Cancer Father        lung cancer   Diabetes Brother    Stroke Mother   Denies family history of peripheral artery disease, venous disease or renal disease.  Allergies  Allergen Reactions   Contrast Media  [Iodinated Diagnostic Agents] Anaphylaxis   Iodine Swelling    (IV only) - angioedema   REVIEW OF SYSTEMS (Negative unless checked)  Constitutional: [] Weight loss  [] Fever  [] Chills Cardiac: [] Chest pain   [] Chest pressure   [] Palpitations   [x] Shortness of breath when laying flat   [x] Shortness of breath at rest   [x] Shortness of breath with exertion. Vascular:  [] Pain in legs with walking   [] Pain in legs at rest   [] Pain in legs when laying flat   [] Claudication   [] Pain in feet when walking  [] Pain in feet at rest  [] Pain in feet when laying flat   [] History of DVT   [] Phlebitis   [x] Swelling in legs   [] Varicose veins   [] Non-healing  ulcers Pulmonary:   [] Uses home oxygen   [] Productive cough   [] Hemoptysis   [] Wheeze  [] COPD   [] Asthma Neurologic:  [] Dizziness  [] Blackouts   [] Seizures   [] History of stroke   [] History of TIA  [] Aphasia   [] Temporary blindness   [] Dysphagia   [] Weakness or numbness in arms   [] Weakness or numbness in legs Musculoskeletal:  [] Arthritis   [] Joint swelling   [] Joint pain   [] Low back pain Hematologic:  [] Easy bruising  [] Easy bleeding   [] Hypercoagulable state   [] Anemic  [] Hepatitis Gastrointestinal:  [] Blood in stool   [] Vomiting blood  [] Gastroesophageal reflux/heartburn   [] Difficulty swallowing. Genitourinary:  [x] Chronic kidney  disease   [] Difficult urination  [] Frequent urination  [] Burning with urination   [] Blood in urine Skin:  [] Rashes   [] Ulcers   [] Wounds Psychological:  [] History of anxiety   []  History of major depression.  Physical Examination  Vitals:   06/28/21 0100 06/28/21 0344 06/28/21 0813 06/28/21 1157  BP:  (!) 152/64 (!) 150/59 (!) 122/56  Pulse:  85 83 79  Resp:  16 19 19   Temp:  98.8 F (37.1 C) 97.6 F (36.4 C) (!) 97.5 F (36.4 C)  TempSrc:   Oral Oral  SpO2:  94% 93% 94%  Weight: 106.8 kg     Height:       Body mass index is 43.06 kg/m. Gen:  WD/WN, NAD Head: [place]/AT, No temporalis wasting. Prominent temp pulse not noted. Ear/Nose/Throat: Hearing grossly intact, nares w/o erythema or drainage, oropharynx w/o Erythema/Exudate Eyes: Sclera non-icteric, conjunctiva clear Neck: Trachea midline.  No JVD.  Pulmonary:  Good air movement, respirations not labored, equal bilaterally.  Cardiac: RRR, normal S1, S2. Vascular:  Vessel Right Left  Radial Palpable Palpable  Ulnar Palpable Palpable                               Gastrointestinal: soft, non-tender/non-distended. No guarding/reflex.  Musculoskeletal: M/S 5/5 throughout.  Extremities without ischemic changes.  No deformity or atrophy. No edema. Neurologic: Sensation grossly intact in  extremities.  Symmetrical.  Speech is fluent. Motor exam as listed above. Psychiatric: Judgment intact, Mood & affect appropriate for pt's clinical situation. Dermatologic: No rashes or ulcers noted.  No cellulitis or open wounds. Lymph : No Cervical, Axillary, or Inguinal lymphadenopathy.  CBC Lab Results  Component Value Date   WBC 20.2 (H) 06/28/2021   HGB 8.8 (L) 06/28/2021   HCT 25.7 (L) 06/28/2021   MCV 78.4 (L) 06/28/2021   PLT 317 06/28/2021   BMET    Component Value Date/Time   NA 130 (L) 06/28/2021 0458   NA 142 07/19/2015 1012   NA 133 (L) 01/29/2014 0406   K 4.8 06/28/2021 0458   K 4.0 01/29/2014 0406   CL 96 (L) 06/28/2021 0458   CL 96 (L) 01/29/2014 0406   CO2 16 (L) 06/28/2021 0458   CO2 29 01/29/2014 0406   GLUCOSE 264 (H) 06/28/2021 0458   GLUCOSE 377 (H) 01/29/2014 0406   BUN 163 (H) 06/28/2021 0458   BUN 23 07/19/2015 1012   BUN 31 (H) 01/29/2014 0406   CREATININE 8.66 (H) 06/28/2021 0458   CREATININE 1.17 (H) 03/17/2018 0830   CALCIUM 7.6 (L) 06/28/2021 0458   CALCIUM 9.7 01/29/2014 0406   GFRNONAA 4 (L) 06/28/2021 0458   GFRNONAA 48 (L) 08/21/2016 0908   GFRAA 29 (L) 12/22/2019 0732   GFRAA 55 (L) 08/21/2016 0908   Estimated Creatinine Clearance: 6.6 mL/min (A) (by C-G formula based on SCr of 8.66 mg/dL (H)).  COAG Lab Results  Component Value Date   INR 1.4 (H) 06/27/2021   Radiology US RENAL  Result Date: 06/27/2021 CLINICAL DATA:  Acute renal failure EXAM: RENAL / URINARY TRACT ULTRASOUND COMPLETE COMPARISON:  12/15/2019 abdominal CT FINDINGS: Right Kidney: Renal measurements: 13 x 6 x 6 cm = volume: 240 mL. Moderate hydronephrosis, similar to comparison CT. There is a shadowing stone at the lower pole measuring 13 mm. Left Kidney: Renal measurements: 13 x 8 x 7 cm = volume: 360 mL. Mild hydronephrosis, new from comparison study. Branching stone at  the upper pole which is underestimated compared to comparison CT. No evidence of mass Bladder:  History of Foley catheter with complete bladder decompression. IMPRESSION: 1. Moderate right hydronephrosis that is stable from January 2021 CT. 2. Mild left hydronephrosis which is new from 2021. 3. Bilateral nephrolithiasis. 4. Completely drained bladder. Electronically Signed   By: Monte Fantasia M.D.   On: 06/27/2021 06:13   DG Chest Portable 1 View  Result Date: 06/27/2021 CLINICAL DATA:  Shortness of breath EXAM: PORTABLE CHEST 1 VIEW COMPARISON:  01/27/2014 FINDINGS: Shallow lung inflation. Consolidation or atelectasis at the left lung base. Linear right basilar atelectasis. No pneumothorax. Suspect small left pleural effusion. Normal cardiomediastinal contours. IMPRESSION: Shallow lung inflation with left basilar consolidation or atelectasis and suspected small left pleural effusion. Electronically Signed   By: Ulyses Jarred M.D.   On: 06/27/2021 01:27   ECHOCARDIOGRAM COMPLETE  Result Date: 06/27/2021    ECHOCARDIOGRAM REPORT   Patient Name:   Heather Lopez Date of Exam: 06/27/2021 Medical Rec #:  [id number]  Height:       62.0 in Accession #:    [id number] Weight:       220.0 lb Date of Birth:  March 17, 1946   BSA:          1.991 m Patient Age:    46 years   BP:           130/74 mmHg Patient Gender: F          HR:           79 bpm. Exam Location:  [facility] Procedure: 2D Echo, Color Doppler and Cardiac Doppler Indications:     Fever R50.9  History:         Patient has no prior history of Echocardiogram examinations.                  Risk Factors:Hypertension and Diabetes.  Sonographer:     Sherrie Sport RDCS (AE) Referring Phys:  [id number] [facility] Diagnosing Phys: Nelva Bush MD  Sonographer Comments: Suboptimal apical window. IMPRESSIONS  1. Left ventricular ejection fraction, by estimation, is 55 to 60%. The left ventricle has normal function. Left ventricular endocardial border not optimally defined to evaluate regional wall motion. There is mild left ventricular hypertrophy. Left ventricular diastolic  parameters were normal.  2. Right ventricular systolic function was not well visualized. The right ventricular size is normal. Tricuspid regurgitation signal is inadequate for assessing PA pressure.  3. Left atrial size was moderately dilated.  4. The mitral valve is abnormal. No evidence of mitral valve regurgitation. No evidence of mitral stenosis.  5. The aortic valve is tricuspid. There is mild calcification of the aortic valve. There is mild thickening of the aortic valve. Aortic valve regurgitation is not visualized. Mild to moderate aortic valve sclerosis/calcification is present, without any evidence of aortic stenosis. FINDINGS  Left Ventricle: Left ventricular ejection fraction, by estimation, is 55 to 60%. The left ventricle has normal function. Left ventricular endocardial border not optimally defined to evaluate regional wall motion. The left ventricular internal cavity size was normal in size. There is mild left ventricular hypertrophy. Left ventricular diastolic parameters were normal. Right Ventricle: The right ventricular size is normal. Right vetricular wall thickness was not well visualized. Right ventricular systolic function was not well visualized. Tricuspid regurgitation signal is inadequate for assessing PA pressure. Left Atrium: Left atrial size was moderately dilated. Right Atrium: Right atrial size was normal in size. Pericardium: The pericardium was  not well visualized. Mitral Valve: The mitral valve is abnormal. There is mild thickening of the mitral valve leaflet(s). No evidence of mitral valve regurgitation. No evidence of mitral valve stenosis. Tricuspid Valve: The tricuspid valve is not well visualized. Tricuspid valve regurgitation is trivial. Aortic Valve: The aortic valve is tricuspid. There is mild calcification of the aortic valve. There is mild thickening of the aortic valve. Aortic valve regurgitation is not visualized. Mild to moderate aortic valve sclerosis/calcification is  present, without any evidence of aortic stenosis. Aortic valve mean gradient measures 4.0 mmHg. Aortic valve peak gradient measures 6.9 mmHg. Aortic valve area, by VTI measures 2.21 cm. Pulmonic Valve: The pulmonic valve was not well visualized. Pulmonic valve regurgitation is not visualized. No evidence of pulmonic stenosis. Aorta: The aortic root is normal in size and structure. Pulmonary Artery: The pulmonary artery is not well seen. Venous: The inferior vena cava was not well visualized. IAS/Shunts: The interatrial septum was not well visualized.  LEFT VENTRICLE PLAX 2D LVIDd:         4.85 cm  Diastology LVIDs:         3.21 cm  LV e' lateral:   11.20 cm/s LV PW:         1.11 cm  LV E/e' lateral: 7.8 LV IVS:        0.99 cm LVOT diam:     2.00 cm LV SV:         53 LV SV Index:   27 LVOT Area:     3.14 cm  RIGHT VENTRICLE RV Basal diam:  3.06 cm LEFT ATRIUM             Index       RIGHT ATRIUM           Index LA diam:        4.80 cm 2.41 cm/m  RA Area:     16.60 cm LA Vol (A2C):   98.8 ml 49.63 ml/m RA Volume:   45.70 ml  22.96 ml/m LA Vol (A4C):   69.4 ml 34.86 ml/m LA Biplane Vol: 89.2 ml 44.81 ml/m  AORTIC VALVE                   PULMONIC VALVE AV Area (Vmax):    2.04 cm    PV Vmax:        0.75 m/s AV Area (Vmean):   1.91 cm    PV Peak grad:   2.2 mmHg AV Area (VTI):     2.21 cm    RVOT Peak grad: 3 mmHg AV Vmax:           131.00 cm/s AV Vmean:          88.000 cm/s AV VTI:            0.239 m AV Peak Grad:      6.9 mmHg AV Mean Grad:      4.0 mmHg LVOT Vmax:         84.90 cm/s LVOT Vmean:        53.600 cm/s LVOT VTI:          0.168 m LVOT/AV VTI ratio: 0.70  AORTA Ao Root diam: 3.00 cm MITRAL VALVE MV Area (PHT): 9.37 cm    SHUNTS MV Decel Time: 81 msec     Systemic VTI:  0.17 m MV E velocity: 87.40 cm/s  Systemic Diam: 2.00 cm MV A velocity: 92.10 cm/s MV E/A ratio:  0.95 Harrell Gave End MD  Electronically signed by Nelva Bush MD Signature Date/Time: 06/27/2021/5:01:47 PM    Final      Assessment/Plan Heather Lopez is a 75 year old female with history of hypertension, diabetes mellitus, hyperlipidemia has been feeling weak for the last week found to be in acute on chronic renal failure  1.  Acute on Chronic Renal Failure: Patient presented to the [facility] Emergency Department with a chief complaint of progressively worsening weakness.  Patient with known history of chronic kidney disease.  Recent labs are notable for increasing creatinine /hyperkalemia.  Patient was seen by nephrology he would like to initiate dialysis at this time.  The patient does not have an adequate dialysis access so vascular surgery was consulted for placement of a PermCath.  This will allow the patient to dialyze in both the outpatient inpatient setting.  Procedure, risks and benefits were explained to the patient.  All questions were answered.  The patient wishes to proceed.  We will plan on this tomorrow with Dr. Delana Meyer.  2.  Diabetes: Noncompliant.  Uncontrolled. Encouraged good control as its slows the progression of atherosclerotic disease.   3.  Hyperlipidemia: Patient is on aspirin and statin for medical management. Encouraged good control as its slows the progression of atherosclerotic disease.  Discussed with Dr. Mayme Genta, PA-C  06/28/2021 2:32 PM  This note was created with Dragon medical transcription system.  Any error is purely unintentional.

## 2021-06-28 NOTE — H&P (View-Only) (Signed)
[facility] Vascular Consult Note  MRN : [id number]  Heather Lopez is a 75 y.o. ([birth year]) female who presents with chief complaint of  Chief Complaint  Patient presents with   Shortness of Breath   Respiratory Distress   History of Present Illness:  Heather Lopez is a 75 year old female with history of hypertension, diabetes mellitus, hyperlipidemia has been feeling weak for the last 3 to 4 days.    Had gone to her primary care about 4 days ago was prescribed Levaquin and prednisone for possible pneumonia.  Despite taking which patient was still feeling weak and having poor appetite unable to eat anything.  Had 1 episode of vomiting.  Denies chest pain or productive cough.   ED Course: In the ER patient appears generally weak.  Labs were showing creatinine of 9.3 which increased from in December 22, 2019.  Potassium was 5.5 sodium 124 bicarb of 9 procalcitonin of 6.8 lactic acid of 5.7 WBC 24.4 hemoglobin 10.4.  ER physician discussed with on-call nephrologist who advised patient to be placed on bicarb drip.  Patient also received fluid bolus and also received D50 IV insulin and sodium bicarb.  COVID test was negative.  Chest x-ray showing possible infiltrates for which patient was started on antibiotics.  While in the ER patient had 1 episode of diarrhea.  Lactic acid level was 5.7 worsened to 6.3.  Nephrology has been consulted and would like to initiate dialysis at this time however the patient does not have an adequate access.  Vascular surgery was consulted by Dr. Juleen China for possible PermCath placement.  Current Facility-Administered Medications  Medication Dose Route Frequency Provider Last Rate Last Admin   amLODipine (NORVASC) tablet 10 mg  10 mg Oral Daily Rise Patience, MD   10 mg at 06/28/21 0912   azithromycin (ZITHROMAX) 500 mg in sodium chloride 0.9 % 250 mL IVPB  500 mg Intravenous Q24H Rise Patience, MD 250 mL/hr at 06/28/21 0215 500 mg at  06/28/21 0215   cefTRIAXone (ROCEPHIN) 2 g in sodium chloride 0.9 % 100 mL IVPB  2 g Intravenous Q24H Rise Patience, MD 200 mL/hr at 06/28/21 0216 2 g at 06/28/21 0216   heparin injection 5,000 Units  5,000 Units Subcutaneous Q8H Rise Patience, MD   5,000 Units at 06/28/21 1238   insulin aspart (novoLOG) injection 0-6 Units  0-6 Units Subcutaneous Q4H Rise Patience, MD   2 Units at 06/28/21 2376   insulin detemir (LEVEMIR) injection 25 Units  25 Units Subcutaneous BID Max Sane, MD       metoprolol tartrate (LOPRESSOR) tablet 25 mg  25 mg Oral BID Rise Patience, MD   25 mg at 06/28/21 2831   patiromer Daryll Drown) packet 25.2 g  25.2 g Oral Daily Kolluru, Sarath, MD   25.2 g at 06/28/21 1238   sodium bicarbonate tablet 1,300 mg  1,300 mg Oral TID Lavonia Dana, MD   1,300 mg at 06/28/21 0912   Past Medical History:  Diagnosis Date   Anxiety    Cataract    Depression    Diabetes mellitus without complication ([place])    Gout    History of kidney stones    Hyperlipidemia    Hypertension    Vertigo    Past Surgical History:  Procedure Laterality Date   CATARACT EXTRACTION, BILATERAL Bilateral 2019   COLONOSCOPY WITH PROPOFOL N/A 08/19/2015   Procedure: COLONOSCOPY WITH PROPOFOL;  Surgeon: Lucilla Lame,  MD;  Location: [place];  Service: Endoscopy;  Laterality: N/A;  diabetic - insulin   CYSTOSCOPY W/ RETROGRADES Bilateral 12/22/2019   Procedure: CYSTOSCOPY WITH RETROGRADE PYELOGRAM;  Surgeon: Abbie Sons, MD;  Location: [facility];  Service: Urology;  Laterality: Bilateral;   CYSTOSCOPY WITH BIOPSY N/A 12/22/2019   Procedure: CYSTOSCOPY WITH bladder BIOPSY;  Surgeon: Abbie Sons, MD;  Location: [facility];  Service: Urology;  Laterality: N/A;   CYSTOSCOPY WITH STENT PLACEMENT Right 12/22/2019   Procedure: CYSTOSCOPY WITH STENT PLACEMENT;  Surgeon: Abbie Sons, MD;  Location: [facility];  Service: Urology;  Laterality: Right;   CYSTOSCOPY WITH  URETEROSCOPY Bilateral 12/22/2019   Procedure: CYSTOSCOPY WITH URETEROSCOPY;  Surgeon: Abbie Sons, MD;  Location: [facility];  Service: Urology;  Laterality: Bilateral;   CYSTOSCOPY/URETEROSCOPY/HOLMIUM LASER/STENT PLACEMENT Right 12/22/2019   Procedure: CYSTOSCOPY/URETEROSCOPY/HOLMIUM LASER/STENT PLACEMENT;  Surgeon: Abbie Sons, MD;  Location: [facility];  Service: Urology;  Laterality: Right;   EYE SURGERY     POLYPECTOMY  08/19/2015   Procedure: POLYPECTOMY;  Surgeon: Lucilla Lame, MD;  Location: [place];  Service: Endoscopy;;   TUBAL LIGATION  1992   Social History Social History   Tobacco Use   Smoking status: Former    Packs/day: 0.75    Years: 6.00    Pack years: 4.50    Types: Cigarettes    Quit date: 1973    Years since quitting: 49.6   Smokeless tobacco: Never   Tobacco comments:    quit 1973  Vaping Use   Vaping Use: Never used  Substance Use Topics   Alcohol use: No    Alcohol/week: 0.0 standard drinks   Drug use: No   Family History Family History  Problem Relation Age of Onset   Diabetes Father    Cancer Father        lung cancer   Diabetes Brother    Stroke Mother   Denies family history of peripheral artery disease, venous disease or renal disease.  Allergies  Allergen Reactions   Contrast Media  [Iodinated Diagnostic Agents] Anaphylaxis   Iodine Swelling    (IV only) - angioedema   REVIEW OF SYSTEMS (Negative unless checked)  Constitutional: [] Weight loss  [] Fever  [] Chills Cardiac: [] Chest pain   [] Chest pressure   [] Palpitations   [x] Shortness of breath when laying flat   [x] Shortness of breath at rest   [x] Shortness of breath with exertion. Vascular:  [] Pain in legs with walking   [] Pain in legs at rest   [] Pain in legs when laying flat   [] Claudication   [] Pain in feet when walking  [] Pain in feet at rest  [] Pain in feet when laying flat   [] History of DVT   [] Phlebitis   [x] Swelling in legs   [] Varicose veins   [] Non-healing  ulcers Pulmonary:   [] Uses home oxygen   [] Productive cough   [] Hemoptysis   [] Wheeze  [] COPD   [] Asthma Neurologic:  [] Dizziness  [] Blackouts   [] Seizures   [] History of stroke   [] History of TIA  [] Aphasia   [] Temporary blindness   [] Dysphagia   [] Weakness or numbness in arms   [] Weakness or numbness in legs Musculoskeletal:  [] Arthritis   [] Joint swelling   [] Joint pain   [] Low back pain Hematologic:  [] Easy bruising  [] Easy bleeding   [] Hypercoagulable state   [] Anemic  [] Hepatitis Gastrointestinal:  [] Blood in stool   [] Vomiting blood  [] Gastroesophageal reflux/heartburn   [] Difficulty swallowing. Genitourinary:  [x] Chronic kidney  disease   [] Difficult urination  [] Frequent urination  [] Burning with urination   [] Blood in urine Skin:  [] Rashes   [] Ulcers   [] Wounds Psychological:  [] History of anxiety   []  History of major depression.  Physical Examination  Vitals:   06/28/21 0100 06/28/21 0344 06/28/21 0813 06/28/21 1157  BP:  (!) 152/64 (!) 150/59 (!) 122/56  Pulse:  85 83 79  Resp:  16 19 19   Temp:  98.8 F (37.1 C) 97.6 F (36.4 C) (!) 97.5 F (36.4 C)  TempSrc:   Oral Oral  SpO2:  94% 93% 94%  Weight: 106.8 kg     Height:       Body mass index is 43.06 kg/m. Gen:  WD/WN, NAD Head: /AT, No temporalis wasting. Prominent temp pulse not noted. Ear/Nose/Throat: Hearing grossly intact, nares w/o erythema or drainage, oropharynx w/o Erythema/Exudate Eyes: Sclera non-icteric, conjunctiva clear Neck: Trachea midline.  No JVD.  Pulmonary:  Good air movement, respirations not labored, equal bilaterally.  Cardiac: RRR, normal S1, S2. Vascular:  Vessel Right Left  Radial Palpable Palpable  Ulnar Palpable Palpable                               Gastrointestinal: soft, non-tender/non-distended. No guarding/reflex.  Musculoskeletal: M/S 5/5 throughout.  Extremities without ischemic changes.  No deformity or atrophy. No edema. Neurologic: Sensation grossly intact in  extremities.  Symmetrical.  Speech is fluent. Motor exam as listed above. Psychiatric: Judgment intact, Mood & affect appropriate for pt's clinical situation. Dermatologic: No rashes or ulcers noted.  No cellulitis or open wounds. Lymph : No Cervical, Axillary, or Inguinal lymphadenopathy.  CBC Lab Results  Component Value Date   WBC 20.2 (H) 06/28/2021   HGB 8.8 (L) 06/28/2021   HCT 25.7 (L) 06/28/2021   MCV 78.4 (L) 06/28/2021   PLT 317 06/28/2021   BMET    Component Value Date/Time   NA 130 (L) 06/28/2021 0458   NA 142 07/19/2015 1012   NA 133 (L) 01/29/2014 0406   K 4.8 06/28/2021 0458   K 4.0 01/29/2014 0406   CL 96 (L) 06/28/2021 0458   CL 96 (L) 01/29/2014 0406   CO2 16 (L) 06/28/2021 0458   CO2 29 01/29/2014 0406   GLUCOSE 264 (H) 06/28/2021 0458   GLUCOSE 377 (H) 01/29/2014 0406   BUN 163 (H) 06/28/2021 0458   BUN 23 07/19/2015 1012   BUN 31 (H) 01/29/2014 0406   CREATININE 8.66 (H) 06/28/2021 0458   CREATININE 1.17 (H) 03/17/2018 0830   CALCIUM 7.6 (L) 06/28/2021 0458   CALCIUM 9.7 01/29/2014 0406   GFRNONAA 4 (L) 06/28/2021 0458   GFRNONAA 48 (L) 08/21/2016 0908   GFRAA 29 (L) 12/22/2019 0732   GFRAA 55 (L) 08/21/2016 0908   Estimated Creatinine Clearance: 6.6 mL/min (A) (by C-G formula based on SCr of 8.66 mg/dL (H)).  COAG Lab Results  Component Value Date   INR 1.4 (H) 06/27/2021   Radiology US RENAL  Result Date: 06/27/2021 CLINICAL DATA:  Acute renal failure EXAM: RENAL / URINARY TRACT ULTRASOUND COMPLETE COMPARISON:  12/15/2019 abdominal CT FINDINGS: Right Kidney: Renal measurements: 13 x 6 x 6 cm = volume: 240 mL. Moderate hydronephrosis, similar to comparison CT. There is a shadowing stone at the lower pole measuring 13 mm. Left Kidney: Renal measurements: 13 x 8 x 7 cm = volume: 360 mL. Mild hydronephrosis, new from comparison study. Branching stone at  the upper pole which is underestimated compared to comparison CT. No evidence of mass Bladder:  History of Foley catheter with complete bladder decompression. IMPRESSION: 1. Moderate right hydronephrosis that is stable from January 2021 CT. 2. Mild left hydronephrosis which is new from 2021. 3. Bilateral nephrolithiasis. 4. Completely drained bladder. Electronically Signed   By: Monte Fantasia M.D.   On: 06/27/2021 06:13   DG Chest Portable 1 View  Result Date: 06/27/2021 CLINICAL DATA:  Shortness of breath EXAM: PORTABLE CHEST 1 VIEW COMPARISON:  01/27/2014 FINDINGS: Shallow lung inflation. Consolidation or atelectasis at the left lung base. Linear right basilar atelectasis. No pneumothorax. Suspect small left pleural effusion. Normal cardiomediastinal contours. IMPRESSION: Shallow lung inflation with left basilar consolidation or atelectasis and suspected small left pleural effusion. Electronically Signed   By: Ulyses Jarred M.D.   On: 06/27/2021 01:27   ECHOCARDIOGRAM COMPLETE  Result Date: 06/27/2021    ECHOCARDIOGRAM REPORT   Patient Name:   Heather Lopez Date of Exam: 06/27/2021 Medical Rec #:  [id number]  Height:       62.0 in Accession #:    [id number] Weight:       220.0 lb Date of Birth:  1946/08/05   BSA:          1.991 m Patient Age:    42 years   BP:           130/74 mmHg Patient Gender: F          HR:           79 bpm. Exam Location:  [facility] Procedure: 2D Echo, Color Doppler and Cardiac Doppler Indications:     Fever R50.9  History:         Patient has no prior history of Echocardiogram examinations.                  Risk Factors:Hypertension and Diabetes.  Sonographer:     Sherrie Sport RDCS (AE) Referring Phys:  [id number] [facility] Diagnosing Phys: Nelva Bush MD  Sonographer Comments: Suboptimal apical window. IMPRESSIONS  1. Left ventricular ejection fraction, by estimation, is 55 to 60%. The left ventricle has normal function. Left ventricular endocardial border not optimally defined to evaluate regional wall motion. There is mild left ventricular hypertrophy. Left ventricular diastolic  parameters were normal.  2. Right ventricular systolic function was not well visualized. The right ventricular size is normal. Tricuspid regurgitation signal is inadequate for assessing PA pressure.  3. Left atrial size was moderately dilated.  4. The mitral valve is abnormal. No evidence of mitral valve regurgitation. No evidence of mitral stenosis.  5. The aortic valve is tricuspid. There is mild calcification of the aortic valve. There is mild thickening of the aortic valve. Aortic valve regurgitation is not visualized. Mild to moderate aortic valve sclerosis/calcification is present, without any evidence of aortic stenosis. FINDINGS  Left Ventricle: Left ventricular ejection fraction, by estimation, is 55 to 60%. The left ventricle has normal function. Left ventricular endocardial border not optimally defined to evaluate regional wall motion. The left ventricular internal cavity size was normal in size. There is mild left ventricular hypertrophy. Left ventricular diastolic parameters were normal. Right Ventricle: The right ventricular size is normal. Right vetricular wall thickness was not well visualized. Right ventricular systolic function was not well visualized. Tricuspid regurgitation signal is inadequate for assessing PA pressure. Left Atrium: Left atrial size was moderately dilated. Right Atrium: Right atrial size was normal in size. Pericardium: The pericardium was  not well visualized. Mitral Valve: The mitral valve is abnormal. There is mild thickening of the mitral valve leaflet(s). No evidence of mitral valve regurgitation. No evidence of mitral valve stenosis. Tricuspid Valve: The tricuspid valve is not well visualized. Tricuspid valve regurgitation is trivial. Aortic Valve: The aortic valve is tricuspid. There is mild calcification of the aortic valve. There is mild thickening of the aortic valve. Aortic valve regurgitation is not visualized. Mild to moderate aortic valve sclerosis/calcification is  present, without any evidence of aortic stenosis. Aortic valve mean gradient measures 4.0 mmHg. Aortic valve peak gradient measures 6.9 mmHg. Aortic valve area, by VTI measures 2.21 cm. Pulmonic Valve: The pulmonic valve was not well visualized. Pulmonic valve regurgitation is not visualized. No evidence of pulmonic stenosis. Aorta: The aortic root is normal in size and structure. Pulmonary Artery: The pulmonary artery is not well seen. Venous: The inferior vena cava was not well visualized. IAS/Shunts: The interatrial septum was not well visualized.  LEFT VENTRICLE PLAX 2D LVIDd:         4.85 cm  Diastology LVIDs:         3.21 cm  LV e' lateral:   11.20 cm/s LV PW:         1.11 cm  LV E/e' lateral: 7.8 LV IVS:        0.99 cm LVOT diam:     2.00 cm LV SV:         53 LV SV Index:   27 LVOT Area:     3.14 cm  RIGHT VENTRICLE RV Basal diam:  3.06 cm LEFT ATRIUM             Index       RIGHT ATRIUM           Index LA diam:        4.80 cm 2.41 cm/m  RA Area:     16.60 cm LA Vol (A2C):   98.8 ml 49.63 ml/m RA Volume:   45.70 ml  22.96 ml/m LA Vol (A4C):   69.4 ml 34.86 ml/m LA Biplane Vol: 89.2 ml 44.81 ml/m  AORTIC VALVE                   PULMONIC VALVE AV Area (Vmax):    2.04 cm    PV Vmax:        0.75 m/s AV Area (Vmean):   1.91 cm    PV Peak grad:   2.2 mmHg AV Area (VTI):     2.21 cm    RVOT Peak grad: 3 mmHg AV Vmax:           131.00 cm/s AV Vmean:          88.000 cm/s AV VTI:            0.239 m AV Peak Grad:      6.9 mmHg AV Mean Grad:      4.0 mmHg LVOT Vmax:         84.90 cm/s LVOT Vmean:        53.600 cm/s LVOT VTI:          0.168 m LVOT/AV VTI ratio: 0.70  AORTA Ao Root diam: 3.00 cm MITRAL VALVE MV Area (PHT): 9.37 cm    SHUNTS MV Decel Time: 81 msec     Systemic VTI:  0.17 m MV E velocity: 87.40 cm/s  Systemic Diam: 2.00 cm MV A velocity: 92.10 cm/s MV E/A ratio:  0.95 Harrell Gave End MD  Electronically signed by Nelva Bush MD Signature Date/Time: 06/27/2021/5:01:47 PM    Final      Assessment/Plan Heather Lopez is a 75 year old female with history of hypertension, diabetes mellitus, hyperlipidemia has been feeling weak for the last week found to be in acute on chronic renal failure  1.  Acute on Chronic Renal Failure: Patient presented to the [facility] Emergency Department with a chief complaint of progressively worsening weakness.  Patient with known history of chronic kidney disease.  Recent labs are notable for increasing creatinine /hyperkalemia.  Patient was seen by nephrology he would like to initiate dialysis at this time.  The patient does not have an adequate dialysis access so vascular surgery was consulted for placement of a PermCath.  This will allow the patient to dialyze in both the outpatient inpatient setting.  Procedure, risks and benefits were explained to the patient.  All questions were answered.  The patient wishes to proceed.  We will plan on this tomorrow with Dr. Delana Meyer.  2.  Diabetes: Noncompliant.  Uncontrolled. Encouraged good control as its slows the progression of atherosclerotic disease.   3.  Hyperlipidemia: Patient is on aspirin and statin for medical management. Encouraged good control as its slows the progression of atherosclerotic disease.  Discussed with Dr. Mayme Genta, PA-C  06/28/2021 2:32 PM  This note was created with Dragon medical transcription system.  Any error is purely unintentional.

## 2021-06-28 NOTE — Progress Notes (Signed)
[facility]  ROUNDING NOTE   Subjective:   Heather Lopez  is a 75 y.o. female who has been admitted for community acquired pneumonia. Patient with weakness for 3-4 days. She has been having shortness of breath. She was evaluated by her PCP, who diagnosed her with pneumonia and she was prescribed levofloxacin.   Patient seen resting in bed States she feels really bad Denies nausea and vomiting Tolerating meals Denies shortness of breath  Minimal creatinine improvement 8.66 (9.14) UOP 1.9L in past 24 hours  Objective:  Vital signs in last 24 hours:  Temp:  [97.5 F (36.4 C)-98.8 F (37.1 C)] 97.5 F (36.4 C) (08/03 1157) Pulse Rate:  [79-103] 79 (08/03 1157) Resp:  [16-20] 19 (08/03 1157) BP: (121-152)/(56-77) 122/56 (08/03 1157) SpO2:  [92 %-97 %] 94 % (08/03 1157) Weight:  [106.8 kg] 106.8 kg (08/03 0100)  Weight change: 6.985 kg Filed Weights   06/27/21 0028 06/28/21 0100  Weight: 99.8 kg 106.8 kg    Intake/Output: I/O last 3 completed shifts: In: 2943 [P.O.:490; I.V.:2103; IV Piggyback:350] Out: 1950 [UUVOZ:3664]   Intake/Output this shift:  No intake/output data recorded.  Physical Exam: General: NAD, laying in bed  Head: Normocephalic, atraumatic. Moist oral mucosal membranes  Eyes: Anicteric  Lungs:  Clear to auscultation, normal effort  Heart: Regular rate and rhythm  Abdomen:  Soft, nontender  Extremities:  trace peripheral edema.  Neurologic: Nonfocal, moving all four extremities  Skin: No lesions       Basic Metabolic Panel: Recent Labs  Lab 06/27/21 0032 06/27/21 0508 06/27/21 0713 06/27/21 0911 06/27/21 1459 06/27/21 2231 06/28/21 0458  NA 124*  --  130*  --  126* 129* 130*  K 7.5*  --  6.5*  --  5.9* 5.0 4.8  CL 93*  --  97*  --  95* 96* 96*  CO2 9*  --  11*  --  11* 14* 16*  GLUCOSE 206*  --  187*  --  224* 261* 264*  BUN 154*  --  136*  --  194* 170* 163*  CREATININE 9.32* 9.14* 9.37*  --  9.44* 9.14* 8.66*  CALCIUM  8.3*  --  7.9*  --  7.8* 7.7* 7.6*  MG  --   --   --  1.7  --   --   --   PHOS  --   --   --   --  9.5* 9.1*  --     Liver Function Tests: Recent Labs  Lab 06/27/21 0713 06/27/21 1459 06/27/21 2231 06/28/21 0458  AST 20  --   --  13*  ALT 18  --   --  16  ALKPHOS 56  --   --  62  BILITOT 0.5  --   --  0.5  PROT 6.4*  --   --  6.1*  ALBUMIN 2.6* 2.7* 2.6* 2.5*   No results for input(s): LIPASE, AMYLASE in the last 168 hours. No results for input(s): AMMONIA in the last 168 hours.  CBC: Recent Labs  Lab 06/27/21 0032 06/27/21 0508 06/27/21 0713 06/28/21 0458  WBC 24.4* 19.4* 19.2* 20.2*  NEUTROABS 21.1*  --  17.9* 18.3*  HGB 10.4* 9.6* 8.9* 8.8*  HCT 30.9* 28.7* 26.4* 25.7*  MCV 83.7 82.5 83.8 78.4*  PLT 483* 381 343 317    Cardiac Enzymes: No results for input(s): CKTOTAL, CKMB, CKMBINDEX, TROPONINI in the last 168 hours.  BNP: Invalid input(s): POCBNP  CBG: Recent Labs  Lab 06/27/21 2240 06/27/21 2359 06/28/21 0407 06/28/21 0814 06/28/21 1202  GLUCAP 242* 252* 253* 244* 267*    Microbiology: Results for orders placed or performed during the hospital encounter of 06/27/21  Resp Panel by RT-PCR (Flu A&B, Covid) Nasopharyngeal Swab     Status: None   Collection Time: 06/27/21 12:32 AM   Specimen: Nasopharyngeal Swab; Nasopharyngeal(NP) swabs in vial transport medium  Result Value Ref Range Status   SARS Coronavirus 2 by RT PCR NEGATIVE NEGATIVE Final    Comment: (NOTE) SARS-CoV-2 target nucleic acids are NOT DETECTED.  The SARS-CoV-2 RNA is generally detectable in upper respiratory specimens during the acute phase of infection. The lowest concentration of SARS-CoV-2 viral copies this assay can detect is 138 copies/mL. A negative result does not preclude SARS-Cov-2 infection and should not be used as the sole basis for treatment or other patient management decisions. A negative result may occur with  improper specimen collection/handling, submission  of specimen other than nasopharyngeal swab, presence of viral mutation(s) within the areas targeted by this assay, and inadequate number of viral copies(<138 copies/mL). A negative result must be combined with clinical observations, patient history, and epidemiological information. The expected result is Negative.  Fact Sheet for Patients:  EntrepreneurPulse.com.au  Fact Sheet for Healthcare Providers:  IncredibleEmployment.be  This test is no t yet approved or cleared by the [place] FDA and  has been authorized for detection and/or diagnosis of SARS-CoV-2 by FDA under an Emergency Use Authorization (EUA). This EUA will remain  in effect (meaning this test can be used) for the duration of the COVID-19 declaration under Section 564(b)(1) of the Act, 21 U.S.C.section 360bbb-3(b)(1), unless the authorization is terminated  or revoked sooner.       Influenza A by PCR NEGATIVE NEGATIVE Final   Influenza B by PCR NEGATIVE NEGATIVE Final    Comment: (NOTE) The Xpert Xpress SARS-CoV-2/FLU/RSV plus assay is intended as an aid in the diagnosis of influenza from Nasopharyngeal swab specimens and should not be used as a sole basis for treatment. Nasal washings and aspirates are unacceptable for Xpert Xpress SARS-CoV-2/FLU/RSV testing.  Fact Sheet for Patients: EntrepreneurPulse.com.au  Fact Sheet for Healthcare Providers: IncredibleEmployment.be  This test is not yet approved or cleared by the [place] FDA and has been authorized for detection and/or diagnosis of SARS-CoV-2 by FDA under an Emergency Use Authorization (EUA). This EUA will remain in effect (meaning this test can be used) for the duration of the COVID-19 declaration under Section 564(b)(1) of the Act, 21 U.S.C. section 360bbb-3(b)(1), unless the authorization is terminated or revoked.  Performed at [facility], [address] [place], [place] [address]   Blood culture (routine x 2)     Status: None (Preliminary result)   Collection Time: 06/27/21 12:43 AM   Specimen: BLOOD  Result Value Ref Range Status   Specimen Description BLOOD RIGHT ASSIST CONTROL  Final   Special Requests   Final    BOTTLES DRAWN AEROBIC AND ANAEROBIC Blood Culture results may not be optimal due to an inadequate volume of blood received in culture bottles   Culture   Final    NO GROWTH 1 DAY Performed at [facility], [address] [place], [place] [address]    Report Status PENDING  Incomplete  Blood culture (routine x 2)     Status: None (Preliminary result)   Collection Time: 06/27/21  1:45 AM   Specimen: BLOOD  Result Value Ref Range Status  Specimen Description BLOOD RIGHT ASSIST CONTROL  Final   Special Requests   Final    BOTTLES DRAWN AEROBIC AND ANAEROBIC Blood Culture adequate volume   Culture   Final    NO GROWTH 1 DAY Performed at [facility], [address] [place], [place] [address]    Report Status PENDING  Incomplete  Urine Culture     Status: Abnormal   Collection Time: 06/27/21  2:39 AM   Specimen: In/Out Cath Urine  Result Value Ref Range Status   Specimen Description   Final    IN/OUT CATH URINE Performed at [facility], [address] [place], [place] [address]    Special Requests   Final    NONE Performed at [facility], [address] [place], [place] [address]    Culture 30,000 COLONIES/mL YEAST (A)  Final   Report Status 06/28/2021 FINAL  Final    Coagulation Studies: Recent Labs    06/27/21 0032  LABPROT 17.2*  INR 1.4*    Urinalysis: Recent Labs    06/27/21 0239  COLORURINE YELLOW*  LABSPEC 1.012  PHURINE 5.0  GLUCOSEU 50*  HGBUR LARGE*  BILIRUBINUR NEGATIVE  KETONESUR NEGATIVE  PROTEINUR 100*  NITRITE NEGATIVE  LEUKOCYTESUR LARGE*      Imaging: US RENAL  Result Date: 06/27/2021 CLINICAL DATA:  Acute renal failure EXAM:  RENAL / URINARY TRACT ULTRASOUND COMPLETE COMPARISON:  12/15/2019 abdominal CT FINDINGS: Right Kidney: Renal measurements: 13 x 6 x 6 cm = volume: 240 mL. Moderate hydronephrosis, similar to comparison CT. There is a shadowing stone at the lower pole measuring 13 mm. Left Kidney: Renal measurements: 13 x 8 x 7 cm = volume: 360 mL. Mild hydronephrosis, new from comparison study. Branching stone at the upper pole which is underestimated compared to comparison CT. No evidence of mass Bladder: History of Foley catheter with complete bladder decompression. IMPRESSION: 1. Moderate right hydronephrosis that is stable from January 2021 CT. 2. Mild left hydronephrosis which is new from 2021. 3. Bilateral nephrolithiasis. 4. Completely drained bladder. Electronically Signed   By: Monte Fantasia M.D.   On: 06/27/2021 06:13   DG Chest Portable 1 View  Result Date: 06/27/2021 CLINICAL DATA:  Shortness of breath EXAM: PORTABLE CHEST 1 VIEW COMPARISON:  01/27/2014 FINDINGS: Shallow lung inflation. Consolidation or atelectasis at the left lung base. Linear right basilar atelectasis. No pneumothorax. Suspect small left pleural effusion. Normal cardiomediastinal contours. IMPRESSION: Shallow lung inflation with left basilar consolidation or atelectasis and suspected small left pleural effusion. Electronically Signed   By: Ulyses Jarred M.D.   On: 06/27/2021 01:27   ECHOCARDIOGRAM COMPLETE  Result Date: 06/27/2021    ECHOCARDIOGRAM REPORT   Patient Name:   Callia E Quaintance Date of Exam: 06/27/2021 Medical Rec #:  [id number]  Height:       62.0 in Accession #:    [id number] Weight:       220.0 lb Date of Birth:  09-Nov-1946   BSA:          1.991 m Patient Age:    46 years   BP:           130/74 mmHg Patient Gender: F          HR:           79 bpm. Exam Location:  [facility] Procedure: 2D Echo, Color Doppler and Cardiac Doppler Indications:     Fever R50.9  History:         Patient  has no prior history of Echocardiogram examinations.                   Risk Factors:Hypertension and Diabetes.  Sonographer:     Sherrie Sport RDCS (AE) Referring Phys:  [id number] [facility] Diagnosing Phys: Nelva Bush MD  Sonographer Comments: Suboptimal apical window. IMPRESSIONS  1. Left ventricular ejection fraction, by estimation, is 55 to 60%. The left ventricle has normal function. Left ventricular endocardial border not optimally defined to evaluate regional wall motion. There is mild left ventricular hypertrophy. Left ventricular diastolic parameters were normal.  2. Right ventricular systolic function was not well visualized. The right ventricular size is normal. Tricuspid regurgitation signal is inadequate for assessing PA pressure.  3. Left atrial size was moderately dilated.  4. The mitral valve is abnormal. No evidence of mitral valve regurgitation. No evidence of mitral stenosis.  5. The aortic valve is tricuspid. There is mild calcification of the aortic valve. There is mild thickening of the aortic valve. Aortic valve regurgitation is not visualized. Mild to moderate aortic valve sclerosis/calcification is present, without any evidence of aortic stenosis. FINDINGS  Left Ventricle: Left ventricular ejection fraction, by estimation, is 55 to 60%. The left ventricle has normal function. Left ventricular endocardial border not optimally defined to evaluate regional wall motion. The left ventricular internal cavity size was normal in size. There is mild left ventricular hypertrophy. Left ventricular diastolic parameters were normal. Right Ventricle: The right ventricular size is normal. Right vetricular wall thickness was not well visualized. Right ventricular systolic function was not well visualized. Tricuspid regurgitation signal is inadequate for assessing PA pressure. Left Atrium: Left atrial size was moderately dilated. Right Atrium: Right atrial size was normal in size. Pericardium: The pericardium was not well visualized. Mitral Valve: The mitral valve is  abnormal. There is mild thickening of the mitral valve leaflet(s). No evidence of mitral valve regurgitation. No evidence of mitral valve stenosis. Tricuspid Valve: The tricuspid valve is not well visualized. Tricuspid valve regurgitation is trivial. Aortic Valve: The aortic valve is tricuspid. There is mild calcification of the aortic valve. There is mild thickening of the aortic valve. Aortic valve regurgitation is not visualized. Mild to moderate aortic valve sclerosis/calcification is present, without any evidence of aortic stenosis. Aortic valve mean gradient measures 4.0 mmHg. Aortic valve peak gradient measures 6.9 mmHg. Aortic valve area, by VTI measures 2.21 cm. Pulmonic Valve: The pulmonic valve was not well visualized. Pulmonic valve regurgitation is not visualized. No evidence of pulmonic stenosis. Aorta: The aortic root is normal in size and structure. Pulmonary Artery: The pulmonary artery is not well seen. Venous: The inferior vena cava was not well visualized. IAS/Shunts: The interatrial septum was not well visualized.  LEFT VENTRICLE PLAX 2D LVIDd:         4.85 cm  Diastology LVIDs:         3.21 cm  LV e' lateral:   11.20 cm/s LV PW:         1.11 cm  LV E/e' lateral: 7.8 LV IVS:        0.99 cm LVOT diam:     2.00 cm LV SV:         53 LV SV Index:   27 LVOT Area:     3.14 cm  RIGHT VENTRICLE RV Basal diam:  3.06 cm LEFT ATRIUM             Index       RIGHT ATRIUM  Index LA diam:        4.80 cm 2.41 cm/m  RA Area:     16.60 cm LA Vol (A2C):   98.8 ml 49.63 ml/m RA Volume:   45.70 ml  22.96 ml/m LA Vol (A4C):   69.4 ml 34.86 ml/m LA Biplane Vol: 89.2 ml 44.81 ml/m  AORTIC VALVE                   PULMONIC VALVE AV Area (Vmax):    2.04 cm    PV Vmax:        0.75 m/s AV Area (Vmean):   1.91 cm    PV Peak grad:   2.2 mmHg AV Area (VTI):     2.21 cm    RVOT Peak grad: 3 mmHg AV Vmax:           131.00 cm/s AV Vmean:          88.000 cm/s AV VTI:            0.239 m AV Peak Grad:      6.9  mmHg AV Mean Grad:      4.0 mmHg LVOT Vmax:         84.90 cm/s LVOT Vmean:        53.600 cm/s LVOT VTI:          0.168 m LVOT/AV VTI ratio: 0.70  AORTA Ao Root diam: 3.00 cm MITRAL VALVE MV Area (PHT): 9.37 cm    SHUNTS MV Decel Time: 81 msec     Systemic VTI:  0.17 m MV E velocity: 87.40 cm/s  Systemic Diam: 2.00 cm MV A velocity: 92.10 cm/s MV E/A ratio:  0.95 Christopher End MD Electronically signed by Nelva Bush MD Signature Date/Time: 06/27/2021/5:01:47 PM    Final      Medications:    azithromycin 500 mg (06/28/21 0215)   cefTRIAXone (ROCEPHIN)  IV 2 g (06/28/21 0216)    amLODipine  10 mg Oral Daily   heparin  5,000 Units Subcutaneous Q8H   insulin aspart  0-6 Units Subcutaneous Q4H   insulin detemir  25 Units Subcutaneous BID   metoprolol tartrate  25 mg Oral BID   patiromer  25.2 g Oral Daily   sodium bicarbonate  1,300 mg Oral TID     Assessment/ Plan:  Heather Lopez is a 75 y.o.  female who has been admitted for community acquired pneumonia. Patient with weakness for 3-4 days. She has been having shortness of breath. She was evaluated by her PCP, who diagnosed her with pneumonia and she was prescribed levofloxacin.   Acute Kidney Injury on chronic kidney disease stage 4 with baseline creatinine 1.97 and GFR of 25 on 12/22/19.  Acute kidney injury secondary to sepsis and obstructive uropathy Chronic kidney disease is secondary to diabetic nephropathy Continue holding losartan and metformin Foley placed with nonoliguric UOP IVF stopped due to respiratory distress Based on minimal renal recovery, we will proceed with initiating renal replacement therapy. This has been discussed with patient and spouse and both are agreeable to this plan. Vascular contacted for permcath placement. Patient made NPO after midnight tonight for placement tomorrow.   Lab Results  Component Value Date   CREATININE 8.66 (H) 06/28/2021   CREATININE 9.14 (H) 06/27/2021   CREATININE 9.44 (H)  06/27/2021    Intake/Output Summary (Last 24 hours) at 06/28/2021 1428 Last data filed at 06/28/2021 0600 Gross per 24 hour  Intake 2343.02 ml  Output 1700 ml  Net 643.02 ml   2. Acute Respiratory failure secondary to community acquired pneumonia failing outpatient antibiotics. Afebrile overnight. Blood cultures show no growth. Continue antibiotics  and supportive care. Consider steroids, if needed.   3. Anemia of chronic kidney disease Normocytic Lab Results  Component Value Date   HGB 8.8 (L) 06/28/2021   Hgb below target, will monitor  4. Hypertension: 122/56 Continue amlodipine and metoprolol ECHO on 06/27/21 shows EF 55-60%  5. Nephrolithiasis- with obstructive uropathy and symptoms consistent with UTI. Urine culture positive for 30,000 colonies of yeast. Continue antibiotics Followed by Urology outpatient with urethral stent placement in past  6. Hyponatremia: secondary to hypovolemia. Improved to 130  7. Diabetes mellitus type II with chronic kidney disease insulin dependent. Most recent hemoglobin A1c is 8.3 on 06/12/21.  Metformin held    LOS: 1 [place] 8/3/20222:28 PM

## 2021-06-29 ENCOUNTER — Inpatient Hospital Stay: Payer: Medicare Other

## 2021-06-29 ENCOUNTER — Encounter
Admission: EM | Disposition: A | Discharge status: Nursing Home (Includes ICF) | Payer: Self-pay | Source: Home / Self Care | Attending: Internal Medicine

## 2021-06-29 DIAGNOSIS — N186 End stage renal disease: Secondary | ICD-10-CM

## 2021-06-29 DIAGNOSIS — K631 Perforation of intestine (nontraumatic): Secondary | ICD-10-CM | POA: Diagnosis not present

## 2021-06-29 DIAGNOSIS — Z992 Dependence on renal dialysis: Secondary | ICD-10-CM

## 2021-06-29 DIAGNOSIS — K668 Other specified disorders of peritoneum: Secondary | ICD-10-CM

## 2021-06-29 DIAGNOSIS — R100 Acute abdomen: Secondary | ICD-10-CM

## 2021-06-29 DIAGNOSIS — Z0000 Encounter for general adult medical examination without abnormal findings: Secondary | ICD-10-CM

## 2021-06-29 HISTORY — PX: LAPAROTOMY: SHX154

## 2021-06-29 HISTORY — PX: DIALYSIS/PERMA CATHETER INSERTION: CATH118288

## 2021-06-29 LAB — BLOOD GAS, ARTERIAL
Acid-base deficit: 5.1 mmol/L — ABNORMAL HIGH (ref 0.0–2.0)
Bicarbonate: 17.7 mmol/L — ABNORMAL LOW (ref 20.0–28.0)
FIO2: 0.45
O2 Saturation: 91.9 %
Patient temperature: 37
pCO2 arterial: 26 mmHg — ABNORMAL LOW (ref 32.0–48.0)
pH, Arterial: 7.44 (ref 7.350–7.450)
pO2, Arterial: 61 mmHg — ABNORMAL LOW (ref 83.0–108.0)

## 2021-06-29 LAB — PROTEIN ELECTRO, RANDOM URINE
Albumin ELP, Urine: 48.5 %
Alpha-1-Globulin, U: 1.1 %
Alpha-2-Globulin, U: 16.8 %
Beta Globulin, U: 11.1 %
Gamma Globulin, U: 22.5 %
M Component, Ur: 8 % — ABNORMAL HIGH
Total Protein, Urine: 108.8 mg/dL

## 2021-06-29 LAB — LEGIONELLA PNEUMOPHILA SEROGP 1 UR AG: L. pneumophila Serogp 1 Ur Ag: NEGATIVE

## 2021-06-29 LAB — GLUCOSE, CAPILLARY
Glucose-Capillary: 152 mg/dL — ABNORMAL HIGH (ref 70–99)
Glucose-Capillary: 171 mg/dL — ABNORMAL HIGH (ref 70–99)
Glucose-Capillary: 186 mg/dL — ABNORMAL HIGH (ref 70–99)
Glucose-Capillary: 203 mg/dL — ABNORMAL HIGH (ref 70–99)
Glucose-Capillary: 238 mg/dL — ABNORMAL HIGH (ref 70–99)
Glucose-Capillary: 243 mg/dL — ABNORMAL HIGH (ref 70–99)
Glucose-Capillary: 339 mg/dL — ABNORMAL HIGH (ref 70–99)

## 2021-06-29 LAB — PROTEIN ELECTROPHORESIS, SERUM
A/G Ratio: 1 (ref 0.7–1.7)
Albumin ELP: 2.7 g/dL — ABNORMAL LOW (ref 2.9–4.4)
Alpha-1-Globulin: 0.3 g/dL (ref 0.0–0.4)
Alpha-2-Globulin: 1.2 g/dL — ABNORMAL HIGH (ref 0.4–1.0)
Beta Globulin: 0.8 g/dL (ref 0.7–1.3)
Gamma Globulin: 0.5 g/dL (ref 0.4–1.8)
Globulin, Total: 2.8 g/dL (ref 2.2–3.9)
Total Protein ELP: 5.5 g/dL — ABNORMAL LOW (ref 6.0–8.5)

## 2021-06-29 LAB — BASIC METABOLIC PANEL
Anion gap: 17 — ABNORMAL HIGH (ref 5–15)
BUN: 152 mg/dL — ABNORMAL HIGH (ref 8–23)
CO2: 18 mmol/L — ABNORMAL LOW (ref 22–32)
Calcium: 8.1 mg/dL — ABNORMAL LOW (ref 8.9–10.3)
Chloride: 95 mmol/L — ABNORMAL LOW (ref 98–111)
Creatinine, Ser: 6.68 mg/dL — ABNORMAL HIGH (ref 0.44–1.00)
GFR, Estimated: 6 mL/min — ABNORMAL LOW (ref >60)
Glucose, Bld: 240 mg/dL — ABNORMAL HIGH (ref 70–99)
Potassium: 3.8 mmol/L (ref 3.5–5.1)
Sodium: 130 mmol/L — ABNORMAL LOW (ref 135–145)

## 2021-06-29 LAB — CBC
HCT: 27.4 % — ABNORMAL LOW (ref 36.0–46.0)
Hemoglobin: 9.5 g/dL — ABNORMAL LOW (ref 12.0–15.0)
MCH: 27.1 pg (ref 26.0–34.0)
MCHC: 34.7 g/dL (ref 30.0–36.0)
MCV: 78.3 fL — ABNORMAL LOW (ref 80.0–100.0)
Platelets: 380 10*3/uL (ref 150–400)
RBC: 3.5 MIL/uL — ABNORMAL LOW (ref 3.87–5.11)
RDW: 14.6 % (ref 11.5–15.5)
WBC: 20.8 10*3/uL — ABNORMAL HIGH (ref 4.0–10.5)
nRBC: 0.1 % (ref 0.0–0.2)

## 2021-06-29 LAB — PROCALCITONIN: Procalcitonin: 3.18 ng/mL

## 2021-06-29 SURGERY — LAPAROTOMY, EXPLORATORY
Anesthesia: General | Site: Abdomen

## 2021-06-29 SURGERY — DIALYSIS/PERMA CATHETER INSERTION
Anesthesia: Moderate Sedation

## 2021-06-29 MED ORDER — FUROSEMIDE 10 MG/ML IJ SOLN
80.0000 mg | Freq: Once | INTRAMUSCULAR | Status: AC
  Administered 2021-06-29: 80 mg via INTRAVENOUS
  Filled 2021-06-29: qty 8

## 2021-06-29 MED ORDER — ONDANSETRON HCL 4 MG/2ML IJ SOLN
4.0000 mg | Freq: Four times a day (QID) | INTRAMUSCULAR | Status: DC | PRN
  Administered 2021-07-23: 4 mg via INTRAVENOUS
  Filled 2021-06-29: qty 2

## 2021-06-29 MED ORDER — MIDAZOLAM HCL 2 MG/ML PO SYRP
8.0000 mg | ORAL_SOLUTION | Freq: Once | ORAL | Status: DC | PRN
  Filled 2021-06-29: qty 4

## 2021-06-29 MED ORDER — FENTANYL CITRATE (PF) 100 MCG/2ML IJ SOLN
INTRAMUSCULAR | Status: AC
  Filled 2021-06-29: qty 2

## 2021-06-29 MED ORDER — LIDOCAINE HCL (PF) 1 % IJ SOLN
5.0000 mL | INTRAMUSCULAR | Status: DC | PRN
  Filled 2021-06-29: qty 5

## 2021-06-29 MED ORDER — HEPARIN SODIUM (PORCINE) 1000 UNIT/ML DIALYSIS
1000.0000 [IU] | INTRAMUSCULAR | Status: DC | PRN
  Administered 2021-07-07: 1000 [IU] via INTRAVENOUS_CENTRAL
  Administered 2021-07-19: 2800 [IU] via INTRAVENOUS_CENTRAL
  Filled 2021-06-29 (×2): qty 1

## 2021-06-29 MED ORDER — MIDAZOLAM HCL 2 MG/2ML IJ SOLN
INTRAMUSCULAR | Status: DC | PRN
  Administered 2021-06-29: 1 mg via INTRAVENOUS

## 2021-06-29 MED ORDER — FAMOTIDINE 20 MG PO TABS: 40.0000 mg | ORAL_TABLET | Freq: Once | ORAL | Status: DC | PRN

## 2021-06-29 MED ORDER — DIPHENHYDRAMINE HCL 50 MG/ML IJ SOLN: 50.0000 mg | Freq: Once | INTRAMUSCULAR | Status: DC | PRN

## 2021-06-29 MED ORDER — HYDROCODONE-ACETAMINOPHEN 5-325 MG PO TABS
1.0000 | ORAL_TABLET | ORAL | Status: AC | PRN
  Administered 2021-06-29: 1 via ORAL
  Filled 2021-06-29: qty 1

## 2021-06-29 MED ORDER — INSULIN DETEMIR 100 UNIT/ML ~~LOC~~ SOLN
30.0000 [IU] | Freq: Two times a day (BID) | SUBCUTANEOUS | Status: DC
  Filled 2021-06-29 (×3): qty 0.3

## 2021-06-29 MED ORDER — PENTAFLUOROPROP-TETRAFLUOROETH EX AERO
1.0000 "application " | INHALATION_SPRAY | CUTANEOUS | Status: DC | PRN
  Filled 2021-06-29: qty 30

## 2021-06-29 MED ORDER — MORPHINE SULFATE (PF) 2 MG/ML IV SOLN
2.0000 mg | INTRAVENOUS | Status: AC | PRN
  Administered 2021-06-29: 2 mg via INTRAVENOUS
  Filled 2021-06-29: qty 1

## 2021-06-29 MED ORDER — LIDOCAINE-PRILOCAINE 2.5-2.5 % EX CREA
1.0000 "application " | TOPICAL_CREAM | CUTANEOUS | Status: DC | PRN
  Filled 2021-06-29: qty 5

## 2021-06-29 MED ORDER — SODIUM CHLORIDE 0.9 % IV SOLN: INTRAVENOUS | Status: DC

## 2021-06-29 MED ORDER — SODIUM CHLORIDE 0.9 % IV SOLN: 100.0000 mL | INTRAVENOUS | Status: DC | PRN

## 2021-06-29 MED ORDER — CEFAZOLIN SODIUM-DEXTROSE 1-4 GM/50ML-% IV SOLN
1.0000 g | Freq: Once | INTRAVENOUS | Status: DC
  Filled 2021-06-29: qty 50

## 2021-06-29 MED ORDER — METHYLPREDNISOLONE SODIUM SUCC 125 MG IJ SOLR: 125.0000 mg | Freq: Once | INTRAMUSCULAR | Status: DC | PRN

## 2021-06-29 MED ORDER — PIPERACILLIN-TAZOBACTAM IN DEX 2-0.25 GM/50ML IV SOLN
2.2500 g | Freq: Three times a day (TID) | INTRAVENOUS | Status: DC
  Administered 2021-06-29 – 2021-07-05 (×18): 2.25 g via INTRAVENOUS
  Filled 2021-06-29 (×23): qty 50

## 2021-06-29 MED ORDER — CHLORHEXIDINE GLUCONATE CLOTH 2 % EX PADS
6.0000 | MEDICATED_PAD | Freq: Every day | CUTANEOUS | Status: DC
  Administered 2021-06-29 – 2021-08-03 (×34): 6 via TOPICAL

## 2021-06-29 MED ORDER — CEFAZOLIN SODIUM-DEXTROSE 1-4 GM/50ML-% IV SOLN
INTRAVENOUS | Status: AC
  Filled 2021-06-29: qty 50

## 2021-06-29 MED ORDER — PROPOFOL 10 MG/ML IV BOLUS
INTRAVENOUS | Status: AC
  Filled 2021-06-29: qty 20

## 2021-06-29 MED ORDER — HYDROMORPHONE HCL 1 MG/ML IJ SOLN: 1.0000 mg | INTRAMUSCULAR | Status: DC | PRN

## 2021-06-29 MED ORDER — HYDROMORPHONE HCL 1 MG/ML IJ SOLN
1.0000 mg | Freq: Once | INTRAMUSCULAR | Status: AC | PRN
  Administered 2021-06-29: 1 mg via INTRAVENOUS
  Filled 2021-06-29: qty 1

## 2021-06-29 MED ORDER — ALTEPLASE 2 MG IJ SOLR: 2.0000 mg | Freq: Once | INTRAMUSCULAR | Status: DC | PRN

## 2021-06-29 MED ORDER — SODIUM CHLORIDE 0.9 % IV SOLN
100.0000 mL | INTRAVENOUS | Status: DC | PRN
  Administered 2021-06-29: 100 mL via INTRAVENOUS

## 2021-06-29 MED ORDER — MIDAZOLAM HCL 2 MG/2ML IJ SOLN
INTRAMUSCULAR | Status: AC
  Filled 2021-06-29: qty 2

## 2021-06-29 MED ORDER — HYDROMORPHONE HCL 1 MG/ML IJ SOLN
0.5000 mg | INTRAMUSCULAR | Status: AC | PRN
  Administered 2021-06-29: 0.5 mg via INTRAVENOUS
  Filled 2021-06-29: qty 1

## 2021-06-29 SURGICAL SUPPLY — 57 items
BULB RESERV EVAC DRAIN JP 100C (MISCELLANEOUS) ×6 IMPLANT
CANISTER SUCT 1200ML W/VALVE (MISCELLANEOUS) IMPLANT
CHLORAPREP W/TINT 26 (MISCELLANEOUS) ×6 IMPLANT
DECANTER SPIKE VIAL GLASS SM (MISCELLANEOUS) ×3 IMPLANT
DRAIN CHANNEL JP 19F (MISCELLANEOUS) ×6 IMPLANT
DRAIN PENROSE 1/4X12 LTX STRL (WOUND CARE) ×3 IMPLANT
DRAPE LAPAROTOMY 100X77 ABD (DRAPES) ×3 IMPLANT
DRAPE LEGGINS SURG 28X43 STRL (DRAPES) IMPLANT
DRAPE UNDER BUTTOCK W/FLU (DRAPES) IMPLANT
DRSG OPSITE POSTOP 4X12 (GAUZE/BANDAGES/DRESSINGS) ×3 IMPLANT
DRSG TEGADERM 4X10 (GAUZE/BANDAGES/DRESSINGS) IMPLANT
DRSG TEGADERM 4X4.75 (GAUZE/BANDAGES/DRESSINGS) ×9 IMPLANT
ELECT BLADE 6.5 EXT (BLADE) IMPLANT
ELECT CAUTERY BLADE TIP 2.5 (TIP) ×3
ELECT REM PT RETURN 9FT ADLT (ELECTROSURGICAL) ×3
ELECTRODE CAUTERY BLDE TIP 2.5 (TIP) ×2 IMPLANT
ELECTRODE REM PT RTRN 9FT ADLT (ELECTROSURGICAL) ×2 IMPLANT
GAUZE 4X4 16PLY ~~LOC~~+RFID DBL (SPONGE) ×3 IMPLANT
GAUZE SPONGE 4X4 12PLY STRL (GAUZE/BANDAGES/DRESSINGS) ×3 IMPLANT
GLOVE SURG SYN 7.0 (GLOVE) ×6 IMPLANT
GLOVE SURG SYN 7.5  E (GLOVE) ×2
GLOVE SURG SYN 7.5 E (GLOVE) ×4 IMPLANT
GOWN STRL REUS W/ TWL LRG LVL3 (GOWN DISPOSABLE) ×4 IMPLANT
GOWN STRL REUS W/TWL LRG LVL3 (GOWN DISPOSABLE) ×2
KIT TURNOVER KIT A (KITS) ×3 IMPLANT
LABEL OR SOLS (LABEL) ×3 IMPLANT
LIGASURE IMPACT 36 18CM CVD LR (INSTRUMENTS) ×3 IMPLANT
MANIFOLD NEPTUNE II (INSTRUMENTS) ×6 IMPLANT
NEEDLE HYPO 22GX1.5 SAFETY (NEEDLE) ×3 IMPLANT
NS IRRIG 1000ML POUR BTL (IV SOLUTION) ×6 IMPLANT
PACK BASIN MAJOR ARMC (MISCELLANEOUS) ×3 IMPLANT
PACK COLON CLEAN CLOSURE (MISCELLANEOUS) IMPLANT
SEPRAFILM MEMBRANE 5X6 (MISCELLANEOUS) IMPLANT
SPONGE T-LAP 18X18 ~~LOC~~+RFID (SPONGE) ×12 IMPLANT
STAPLER CIRCULAR MANUAL XL 29 (STAPLE) IMPLANT
STAPLER CVD CUT BL 40 RELOAD (ENDOMECHANICALS) IMPLANT
STAPLER PROXIMATE 75MM BLUE (STAPLE) IMPLANT
STAPLER SKIN PROX 35W (STAPLE) ×3 IMPLANT
STAPLER SYS INTERNAL RELOAD SS (MISCELLANEOUS) IMPLANT
SUT ETHILON 3-0 FS-10 30 BLK (SUTURE) ×3
SUT MNCRL 3-0 UNDYED SH (SUTURE) IMPLANT
SUT MONOCRYL 3-0 UNDYED (SUTURE)
SUT PDS AB 1 CT1 27 (SUTURE) ×6 IMPLANT
SUT PDS AB 1 CT1 36 (SUTURE) ×6 IMPLANT
SUT PROLENE 2 0 SH DA (SUTURE) IMPLANT
SUT SILK 2 0 (SUTURE) ×1
SUT SILK 2 0SH CR/8 30 (SUTURE) ×3 IMPLANT
SUT SILK 2-0 (SUTURE) ×3 IMPLANT
SUT SILK 2-0 18XBRD TIE 12 (SUTURE) ×2 IMPLANT
SUT SILK 3-0 (SUTURE) ×3 IMPLANT
SUT VIC AB 1 CTX 27 (SUTURE) IMPLANT
SUT VIC AB 3-0 SH 27 (SUTURE)
SUT VIC AB 3-0 SH 27X BRD (SUTURE) IMPLANT
SUTURE EHLN 3-0 FS-10 30 BLK (SUTURE) ×2 IMPLANT
SYR 10ML LL (SYRINGE) ×3 IMPLANT
SYR 20ML LL LF (SYRINGE) ×9 IMPLANT
TRAY FOLEY MTR SLVR 16FR STAT (SET/KITS/TRAYS/PACK) IMPLANT

## 2021-06-29 SURGICAL SUPPLY — 10 items
BIOPATCH RED 1 DISK 7.0 (GAUZE/BANDAGES/DRESSINGS) ×1 IMPLANT
CATH CANNON HEMO 15FR 19 (HEMODIALYSIS SUPPLIES) IMPLANT
CATH CANNON HEMO 15FR 23CM (HEMODIALYSIS SUPPLIES) ×1 IMPLANT
COVER PROBE U/S 5X48 (MISCELLANEOUS) ×2 IMPLANT
DERMABOND ADVANCED (GAUZE/BANDAGES/DRESSINGS) ×1
DERMABOND ADVANCED .7 DNX12 (GAUZE/BANDAGES/DRESSINGS) IMPLANT
PACK ANGIOGRAPHY (CUSTOM PROCEDURE TRAY) ×1 IMPLANT
SUT MNCRL AB 4-0 PS2 18 (SUTURE) ×1 IMPLANT
SUT PROLENE 0 CT 1 30 (SUTURE) ×1 IMPLANT
TOWEL OR 17X26 4PK STRL BLUE (TOWEL DISPOSABLE) ×1 IMPLANT

## 2021-06-29 NOTE — Progress Notes (Signed)
Patient started on dialysis but unable to continue due CVC not functioning. Able to flush venous lumen with no resistance, resistance noted to arterial lumen. No fluid remove and blood line returned back to patient. Dr. Juleen China and floor nurse Angelia Mould RN made aware.

## 2021-06-29 NOTE — Consult Note (Signed)
Date of Consultation:  06/29/2021  Requesting Physician:  Judd Gaudier, MD  Reason for Consultation:  Pneumoperitoneum  History of Present Illness: JENNESSA TRIGO is a 75 y.o. female recently admitted on 06/27/21 with weakness, suspected pneumonia, and acute on chronic renal failure, with a creatinine of 9.32 on admission.  She was also very acidotic with CO2 of 9, with K of 7.5, and Na 124.  She has been evaluated by nephrology and she had a dialysis catheter placed today by Dr. Lucky Cowboy.  Today, the patient has been having worsening abdominal pain and also increased O2 requirement.  Due to her worsening pain, Dr. Damita Dunnings ordered a CT scan of her abdomen and pelvis and this showed a large amount of pneumoperitoneum with associated free fluid.  The source of the free air is not fully clear, though potentially the stomach is the source.  I have personally viewed the images and agree with the findings.  She also had a new finding of a left renal mass measuring 6.1 x 8 x 4.1 cm suspicious for renal cell carcinoma.    The patient reports that the pain is worst in the mid and upper abdomen, with discomfort or tenderness on her sides and low abdomen.  Denies any nausea or emesis today, though she did have emesis on admission.  Denies any prior history of GERD or ulcers, and has not had any abdominal surgeries but has needed cystoscopies and prior ureteral stents for kidney stones.  Past Medical History: Past Medical History:  Diagnosis Date   Anxiety    Cataract    Depression    Diabetes mellitus without complication ([place])    Gout    History of kidney stones    Hyperlipidemia    Hypertension    Vertigo      Past Surgical History: Past Surgical History:  Procedure Laterality Date   CATARACT EXTRACTION, BILATERAL Bilateral 2019   COLONOSCOPY WITH PROPOFOL N/A 08/19/2015   Procedure: COLONOSCOPY WITH PROPOFOL;  Surgeon: Lucilla Lame, MD;  Location: [place];  Service: Endoscopy;  Laterality: N/A;   diabetic - insulin   CYSTOSCOPY W/ RETROGRADES Bilateral 12/22/2019   Procedure: CYSTOSCOPY WITH RETROGRADE PYELOGRAM;  Surgeon: Abbie Sons, MD;  Location: [facility];  Service: Urology;  Laterality: Bilateral;   CYSTOSCOPY WITH BIOPSY N/A 12/22/2019   Procedure: CYSTOSCOPY WITH bladder BIOPSY;  Surgeon: Abbie Sons, MD;  Location: [facility];  Service: Urology;  Laterality: N/A;   CYSTOSCOPY WITH STENT PLACEMENT Right 12/22/2019   Procedure: CYSTOSCOPY WITH STENT PLACEMENT;  Surgeon: Abbie Sons, MD;  Location: [facility];  Service: Urology;  Laterality: Right;   CYSTOSCOPY WITH URETEROSCOPY Bilateral 12/22/2019   Procedure: CYSTOSCOPY WITH URETEROSCOPY;  Surgeon: Abbie Sons, MD;  Location: [facility];  Service: Urology;  Laterality: Bilateral;   CYSTOSCOPY/URETEROSCOPY/HOLMIUM LASER/STENT PLACEMENT Right 12/22/2019   Procedure: CYSTOSCOPY/URETEROSCOPY/HOLMIUM LASER/STENT PLACEMENT;  Surgeon: Abbie Sons, MD;  Location: [facility];  Service: Urology;  Laterality: Right;   EYE SURGERY     POLYPECTOMY  08/19/2015   Procedure: POLYPECTOMY;  Surgeon: Lucilla Lame, MD;  Location: [place];  Service: Endoscopy;;   TUBAL LIGATION  1992    Home Medications: Prior to Admission medications   Medication Sig Start Date End Date Taking? Authorizing Provider  amLODipine (NORVASC) 10 MG tablet Take 1 tablet (10 mg total) by mouth daily. 04/15/18  Yes Mikey College, NP  aspirin EC 81 MG tablet Take 81 mg by mouth daily.  Yes [provider]  atorvastatin (LIPITOR) 20 MG tablet Take 1 tablet (20 mg total) by mouth daily. 04/15/18  Yes Mikey College, NP  benzonatate (TESSALON) 200 MG capsule Take 200 mg by mouth 3 (three) times daily as needed. 06/23/21  Yes [provider]  buPROPion (WELLBUTRIN XL) 150 MG 24 hr tablet Take 1 tablet (150 mg total) by mouth daily. 05/26/18  Yes Mikey College, NP  insulin aspart (NOVOLOG FLEXPEN) 100 UNIT/ML FlexPen  Use meal coverage 6 units with every meal plus correction scale three times daily up to total of 35 units per day. 04/15/18  Yes Mikey College, NP  Insulin Pen Needle 32G X 4 MM MISC Inject insulin pens up to 5 times daily 04/15/18  Yes Mikey College, NP  Lancets (FREESTYLE) lancets USE AS INSTRUCTED 06/16/19  Yes Karamalegos, Alexander J, DO  LEVEMIR FLEXTOUCH 100 UNIT/ML Pen INJECT 50 UNITS IN THE MORNING AND 44 UNITS IN THE EVENING Patient taking differently: Inject 52 Units into the skin 2 (two) times daily. 06/30/18  Yes Mikey College, NP  levofloxacin (LEVAQUIN) 500 MG tablet Take 500 mg by mouth daily. 06/23/21  Yes [provider]  losartan (COZAAR) 50 MG tablet Take 1 tablet (50 mg total) by mouth daily. 04/15/18  Yes Mikey College, NP  metFORMIN (GLUCOPHAGE) 1000 MG tablet Take 1 tablet (1,000 mg total) by mouth 2 (two) times daily. 04/15/18  Yes Mikey College, NP  metoprolol tartrate (LOPRESSOR) 25 MG tablet Take 1 tablet (25 mg total) by mouth 2 (two) times daily. 05/26/18  Yes Mikey College, NP  Multiple Vitamin (MULTI-VITAMIN) tablet Take 1 tablet by mouth daily.    Yes [provider]  ondansetron (ZOFRAN-ODT) 4 MG disintegrating tablet Take 4 mg by mouth every 8 (eight) hours as needed. 06/23/21  Yes [provider]  predniSONE (DELTASONE) 20 MG tablet Take 20 mg by mouth 2 (two) times daily. 06/23/21  Yes [provider]  TRULICITY 0.08 QP/6.1PJ SOPN SMARTSIG:0.5 Milliliter(s) SUB-Q Once a Week 06/19/21  Yes [provider]    Allergies: Allergies  Allergen Reactions   Contrast Media  [Iodinated Diagnostic Agents] Anaphylaxis   Iodine Swelling    (IV only) - angioedema    Social History:  reports that she quit smoking about 49 years ago. Her smoking use included cigarettes. She has a 4.50 pack-year smoking history. She has never used smokeless tobacco. She reports that she does not drink alcohol  and does not use drugs.   Family History: Family History  Problem Relation Age of Onset   Diabetes Father    Cancer Father        lung cancer   Diabetes Brother    Stroke Mother     Review of Systems: Review of Systems  Constitutional:  Negative for chills and fever.  HENT:  Negative for hearing loss.   Respiratory:  Positive for shortness of breath.   Cardiovascular:  Negative for chest pain.  Gastrointestinal:  Positive for abdominal pain. Negative for constipation, diarrhea, nausea and vomiting.  Genitourinary:  Negative for dysuria.  Musculoskeletal:  Negative for myalgias.  Skin:  Negative for rash.  Neurological:  Negative for dizziness.  Psychiatric/Behavioral:  Negative for depression.    Physical Exam BP 137/80 (BP Location: Right Wrist)   Pulse (!) 106   Temp 98.7 F (37.1 C)   Resp (!) 25   Ht 5\' 2"  (1.575 m)   Wt 106.8 kg  SpO2 90%   BMI 43.06 kg/m  CONSTITUTIONAL:  Patient appears uncomfortable and is somewhat somnolent though very arousable and can carry conversation. HEENT:  Normocephalic, atraumatic, extraocular motion intact. NECK: Trachea is midline, and there is no jugular venous distension. RESPIRATORY:  Has face mask in place with some increased work of breathing. CARDIOVASCULAR: Low grade tachycardia. GI: The abdomen is soft, distended, with tenderness to palpation mostly localized in the mid and upper abdomen, and less intense but diffuse on both sides and lower abdomen.  MUSCULOSKELETAL:  Normal muscle strength and tone in all four extremities.  No peripheral edema or cyanosis. SKIN: Left upper chest dialysis catheter in place.  NEUROLOGIC:  Motor and sensation is grossly normal.  Cranial nerves are grossly intact. PSYCH:  Alert and oriented to person, place and time. Affect is normal.  Laboratory Analysis: Results for orders placed or performed during the hospital encounter of 06/27/21 (from the past 24 hour(s))  Glucose, capillary      Status: Abnormal   Collection Time: 06/29/21 12:12 AM  Result Value Ref Range   Glucose-Capillary 339 (H) 70 - 99 mg/dL  Glucose, capillary     Status: Abnormal   Collection Time: 06/29/21  5:35 AM  Result Value Ref Range   Glucose-Capillary 238 (H) 70 - 99 mg/dL  Procalcitonin     Status: None   Collection Time: 06/29/21  6:01 AM  Result Value Ref Range   Procalcitonin 3.18 ng/mL  Basic metabolic panel     Status: Abnormal   Collection Time: 06/29/21  6:01 AM  Result Value Ref Range   Sodium 130 (L) 135 - 145 mmol/L   Potassium 3.8 3.5 - 5.1 mmol/L   Chloride 95 (L) 98 - 111 mmol/L   CO2 18 (L) 22 - 32 mmol/L   Glucose, Bld 240 (H) 70 - 99 mg/dL   BUN 152 (H) 8 - 23 mg/dL   Creatinine, Ser 6.68 (H) 0.44 - 1.00 mg/dL   Calcium 8.1 (L) 8.9 - 10.3 mg/dL   GFR, Estimated 6 (L) >60 mL/min   Anion gap 17 (H) 5 - 15  CBC     Status: Abnormal   Collection Time: 06/29/21  6:01 AM  Result Value Ref Range   WBC 20.8 (H) 4.0 - 10.5 K/uL   RBC 3.50 (L) 3.87 - 5.11 MIL/uL   Hemoglobin 9.5 (L) 12.0 - 15.0 g/dL   HCT 27.4 (L) 36.0 - 46.0 %   MCV 78.3 (L) 80.0 - 100.0 fL   MCH 27.1 26.0 - 34.0 pg   MCHC 34.7 30.0 - 36.0 g/dL   RDW 14.6 11.5 - 15.5 %   Platelets 380 150 - 400 K/uL   nRBC 0.1 0.0 - 0.2 %  Glucose, capillary     Status: Abnormal   Collection Time: 06/29/21  7:41 AM  Result Value Ref Range   Glucose-Capillary 243 (H) 70 - 99 mg/dL   Comment 1 Notify RN    Comment 2 Document in Chart   Glucose, capillary     Status: Abnormal   Collection Time: 06/29/21 11:52 AM  Result Value Ref Range   Glucose-Capillary 203 (H) 70 - 99 mg/dL  Glucose, capillary     Status: Abnormal   Collection Time: 06/29/21  4:36 PM  Result Value Ref Range   Glucose-Capillary 186 (H) 70 - 99 mg/dL  Glucose, capillary     Status: Abnormal   Collection Time: 06/29/21  7:35 PM  Result Value Ref Range  Glucose-Capillary 171 (H) 70 - 99 mg/dL  Blood gas, arterial     Status: Abnormal   Collection  Time: 06/29/21  8:45 PM  Result Value Ref Range   FIO2 0.45    Delivery systems VENTURI MASK    pH, Arterial 7.44 7.350 - 7.450   pCO2 arterial 26 (L) 32.0 - 48.0 mmHg   pO2, Arterial 61 (L) 83.0 - 108.0 mmHg   Bicarbonate 17.7 (L) 20.0 - 28.0 mmol/L   Acid-base deficit 5.1 (H) 0.0 - 2.0 mmol/L   O2 Saturation 91.9 %   Patient temperature 37.0    Collection site LEFT RADIAL    Sample type ARTERIAL DRAW    Allens test (pass/fail) YES (A) PASS  Glucose, capillary     Status: Abnormal   Collection Time: 06/29/21  9:49 PM  Result Value Ref Range   Glucose-Capillary 152 (H) 70 - 99 mg/dL   Comment 1 Repeat Test     Imaging: CT ABDOMEN PELVIS WO CONTRAST  Result Date: 06/29/2021 CLINICAL DATA:  Hypertension, diabetes, weakness for several days, acute abdominal pain, vomiting EXAM: CT ABDOMEN AND PELVIS WITHOUT CONTRAST TECHNIQUE: Multidetector CT imaging of the abdomen and pelvis was performed following the standard protocol without IV contrast. COMPARISON:  12/15/2019 FINDINGS: Lower chest: There are small bilateral pleural effusions, left greater than right. Scattered hypoventilatory changes are seen at the lung bases. Hepatobiliary: Unenhanced imaging of the liver and gallbladder demonstrates no focal abnormalities. No biliary duct dilation. Pancreas: Unremarkable. No pancreatic ductal dilatation or surrounding inflammatory changes. Spleen: Normal in size without focal abnormality. Adrenals/Urinary Tract: There is a heterogeneous mass with ill-defined margins arising from the upper pole left kidney, measuring 6.1 x 8.0 x 4.1 cm, compatible with renal cell carcinoma. Additionally, there is an obstructing 12 mm proximal left ureteral calculus at the UPJ, with moderate left-sided hydronephrosis. Other nonobstructing left renal calculi are seen, measuring up to 18 mm. There is chronic right hydronephrosis and hydroureter without clear cause for obstruction. Nonobstructing calculi are seen within the  lower pole of the right kidney measuring up to 13 mm in size. The bladder is decompressed with a Foley catheter. Adrenals are unremarkable. Stomach/Bowel: There is no bowel obstruction or ileus. Scattered diverticulosis of the sigmoid colon without evidence of acute diverticulitis. There is a large amount of free gas throughout the peritoneal cavity. Findings are consistent with a ruptured viscus. Given the gas and fluid within the central upper abdomen, this could reflect gastric perforation. Surgical consultation is recommended. Vascular/Lymphatic: There is diffuse atherosclerosis. No pathologic adenopathy within the abdomen or pelvis. Reproductive: Uterus and bilateral adnexa are unremarkable. Other: Small amount of ascites throughout the lower abdomen and pelvis. Diffuse pneumoperitoneum as described above, compatible with perforated viscus. The exact site of perforation is not identified, though the stomach is suspected given the presence of fluid and gas along the gastrohepatic ligament. No abdominal wall hernia. Musculoskeletal: There are no acute bony abnormalities. There is severe right hip osteoarthritis with more mild degenerative change on the left. Reconstructed images demonstrate no additional findings. IMPRESSION: 1. Extensive pneumoperitoneum compatible with perforated viscus. Gastric perforation is suspected, though bowel wall defect is not definitively identified. Surgical consultation is recommended. 2. Large ill-defined mass arising from the upper pole left kidney consistent with renal cell carcinoma. Dedicated renal MRI may be useful when clinical situation permits. 3. Acute left-sided hydronephrosis due to a 12 mm left UPJ calculus. 4. Chronic right hydronephrosis and hydroureter without clear etiology. 5. Bilateral nonobstructing renal  calculi as above. 6. Small volume ascites. 7. Trace bilateral pleural effusions with patchy bibasilar atelectasis. 8.  Aortic Atherosclerosis (ICD10-I70.0).  Critical Value/emergent results were called by telephone at the time of interpretation on 06/29/2021 at 959 pm to provider [facility] , who verbally acknowledged these results. Electronically Signed   By: Randa Ngo M.D.   On: 06/29/2021 22:04   PERIPHERAL VASCULAR CATHETERIZATION  Result Date: 06/29/2021 See surgical note for result.   Assessment and Plan: This is a 75 y.o. female with pneumoperitoneum  --Discussed with the patient the acute findings on her CT scan with free air and fluid in her abdomen consistent with viscus perforation.  However, unclear as to the exact source, but agree with radiology that could be the stomach given the concentration of free air in the upper abdomen and in the gastrohepatic ligament area.  Discussed with her the plan for exploratory laparotomy to evaluate and treat the source.  Depending on location and extent, could treat with primary repair vs bowel resection and anastomosis vs bowel resection and ostomy.  Discussed the risks of bleeding, infection, injury to surrounding structures, post-op care, that she would have drains, NG tube, and foley catheter after surgery, the possibility of remaining intubated after surgery given her increased O2 requirement, and she's willing to proceed. --She has been made NPO, has been started on IV Zosyn, and consent order is placed.  Face-to-face time spent with the patient and care providers was 110 minutes, with more than 50% of the time spent counseling, educating, and coordinating care of the patient.     Melvyn Neth, MD [facility] Pg:  [phone]

## 2021-06-29 NOTE — Progress Notes (Signed)
PROGRESS NOTE    ERSEL WADLEIGH  POE:[id number] DOB: [birth year] DOA: 06/27/2021 PCP: [name], PA-C   Chief complaint.  Generalized weakness. Brief Narrative:  Heather Lopez is a 75 y.o. female with history of hypertension, diabetes mellitus, hyperlipidemia has been feeling weak for the last 3 to 4 days.  Had gone to her primary care about 4 days ago was prescribed Levaquin and prednisone for possible pneumonia.  Despite taking which patient was still feeling weak and having poor appetite unable to eat anything.  Had 1 episode of vomiting. Upon arriving the emergency room, patient had a creatinine of 9.3, potassium was 7.5, sodium 124, pH is 7.15 with bicarb of 7.3.  Lactic acid peaked to 6.3, procalcitonin level 6.83.  White cell 24.4, platelets 483.  Patient also had INR 1.4.  Urine started showed a large amount of leukocytes.  Blood culture was sent out, urine culture also sent. Patient was given bicarb drip, nephrology consult obtained for acute renal failure with severe hyperkalemia. Patient was also placed on Zithromax and Rocephin for UTI. Patient was also placed on BiPAP overnight for worsening short of breath and acute hypoxemic respiratory failure.  Assessment & Plan:   Principal Problem:   ARF (acute renal failure) (HCC) Active Problems:   Uncontrolled type 2 diabetes mellitus with insulin therapy ([place])   Hyperlipidemia associated with type 2 diabetes mellitus ([place])   Acute respiratory failure with hypoxemia (HCC)   Severe sepsis with septic shock ([place])   Acute lower UTI   Acute renal failure (ARF) ([place])  #1.  Septic shock secondary to UTI. Acute hypoxemic respiratory failure on BiPAP. Urinary tract infection -urine culture growing 30,000 colonies of yeast Left lower lobe pneumonia. Patient had a tachycardia, severe leukocytosis, elevated lactic acid level at 6.3.  Procalcitonin level 6.83-> 5.48.  She met septic shock criteria on admission.  Given IV fluids per sepsis  protocol.  Her lactic acid came down to 4.8.  Sepsis treated and resolved Continue Rocephin for pneumonia to finish 5-day course, this also will cover UTI.Heather Lopez  Urine culture growing 30,000 colonies of yeast.  Status post 1 dose of Diflucan on 8/3.  Stopping Zithromax Pending Legionella antigen. Patient also developed acute respiratory failure most likely secondary to septic shock.  Remains on 2 L oxygen via nasal cannula  2. Acute renal failure with underlying CKD stage IV. Severe metabolic acidosis. Severe hyperkalemia. Hyponatremia. S/p left IJ permacath/tunnel hemodialysis catheter placement today on 8/4 - dialysis session after that  #3. Anemia of chronic kidney disease. Thrombocytosis secondary to sepsis. Hemoglobin 9.5 today Vitamin B-12 -> 192, Iron 41  4. Type 2 diabetes. Continue sliding scale insulin.  Increasing insulin Levemir to 30 units units subcu twice daily for better blood sugar control  5. Essential hypertension. Blood pressure slowly creeping up so added Norvasc and Lopressor for better blood pressure control  6.  Baseline ambulation Status: -According to husband patient has severe hip problems and waiting for surgery on her right hip which was recently scheduled for August 24 but now is canceled.  She also has difficulty with her left hip and at some point may need surgery.   -She has been having ambulation difficulty over several months according to husband and would walk with a cane and then her husband would help her move around into wheelchair  7.  Diarrhea -Imodium as needed.  Please note these are not loose watery/smelly so not concerning for C. difficile at this time   DVT  prophylaxis: Heparin Code Status: Full code Family Communication: husband updated at bedside on 8/4 Disposition Plan:   Status is: Inpatient  Remains inpatient appropriate because:Hemodynamically unstable, Ongoing active pain requiring inpatient pain management, IV treatments  appropriate due to intensity of illness or inability to take PO, and Inpatient level of care appropriate due to severity of illness, now needing dialysis so will be longer length of stay  Dispo: The patient is from: Home              Anticipated d/c is to: Home with home health versus SNF              Patient currently is not medically stable to d/c.   Difficult to place patient Yes   I/O last 3 completed shifts: In: 2563 [P.O.:460; I.V.:2103] Out: 65 [Urine:4900] Total I/O In: 39 [I.V.:19] Out: -      Consultants:  Nephrology Vascular surgery  Procedures: S/p dialysis catheter placement on 8/4  Antimicrobials: Rocephin   Subjective: Requesting some pain medication.  Husband at bedside hoping to get her started on dialysis today  Objective: Vitals:   06/29/21 1408 06/29/21 1415 06/29/21 1603 06/29/21 1635  BP: (!) 145/85 (!) 149/62  (!) 174/68  Pulse: (!) 105 (!) 106  (!) 109  Resp: (!) 37 (!) 35  (!) 26  Temp:   98.4 F (36.9 C) 97.9 F (36.6 C)  TempSrc:   Oral   SpO2: (!) 86% 90%  91%  Weight:      Height:        Intake/Output Summary (Last 24 hours) at 06/29/2021 1651 Last data filed at 06/29/2021 1500 Gross per 24 hour  Intake 479 ml  Output 2000 ml  Net -1521 ml   Filed Weights   06/27/21 0028 06/28/21 0100  Weight: 99.8 kg 106.8 kg    Examination:  General exam: Appears calm and comfortable  Respiratory system: Clear to auscultation. Respiratory effort normal. Cardiovascular system: S1 & S2 heard, RRR. No JVD, murmurs, rubs, gallops or clicks. No pedal edema. Gastrointestinal system: Abdomen is nondistended, soft and nontender. No organomegaly or masses felt. Normal bowel sounds heard. Central nervous system: Drowsy and lethargic today.  No focal neurological deficits. Extremities: Symmetric 5 x 5 power. Skin: No rashes, lesions or ulcers Psychiatry: Judgement and insight appear normal. Mood & affect appropriate.     Data Reviewed: I have  personally reviewed following labs and imaging studies  CBC: Recent Labs  Lab 06/27/21 0032 06/27/21 0508 06/27/21 0713 06/28/21 0458 06/29/21 0601  WBC 24.4* 19.4* 19.2* 20.2* 20.8*  NEUTROABS 21.1*  --  17.9* 18.3*  --   HGB 10.4* 9.6* 8.9* 8.8* 9.5*  HCT 30.9* 28.7* 26.4* 25.7* 27.4*  MCV 83.7 82.5 83.8 78.4* 78.3*  PLT 483* 381 343 317 701   Basic Metabolic Panel: Recent Labs  Lab 06/27/21 0713 06/27/21 0911 06/27/21 1459 06/27/21 2231 06/28/21 0458 06/29/21 0601  NA 130*  --  126* 129* 130* 130*  K 6.5*  --  5.9* 5.0 4.8 3.8  CL 97*  --  95* 96* 96* 95*  CO2 11*  --  11* 14* 16* 18*  GLUCOSE 187*  --  224* 261* 264* 240*  BUN 136*  --  194* 170* 163* 152*  CREATININE 9.37*  --  9.44* 9.14* 8.66* 6.68*  CALCIUM 7.9*  --  7.8* 7.7* 7.6* 8.1*  MG  --  1.7  --   --   --   --  PHOS  --   --  9.5* 9.1*  --   --    GFR: Estimated Creatinine Clearance: 8.6 mL/min (A) (by C-G formula based on SCr of 6.68 mg/dL (H)). Liver Function Tests: Recent Labs  Lab 06/27/21 0713 06/27/21 1459 06/27/21 2231 06/28/21 0458  AST 20  --   --  13*  ALT 18  --   --  16  ALKPHOS 56  --   --  62  BILITOT 0.5  --   --  0.5  PROT 6.4*  --   --  6.1*  ALBUMIN 2.6* 2.7* 2.6* 2.5*   No results for input(s): LIPASE, AMYLASE in the last 168 hours. No results for input(s): AMMONIA in the last 168 hours. Coagulation Profile: Recent Labs  Lab 06/27/21 0032  INR 1.4*   Cardiac Enzymes: No results for input(s): CKTOTAL, CKMB, CKMBINDEX, TROPONINI in the last 168 hours. BNP (last 3 results) No results for input(s): PROBNP in the last 8760 hours. HbA1C: No results for input(s): HGBA1C in the last 72 hours. CBG: Recent Labs  Lab 06/29/21 0012 06/29/21 0535 06/29/21 0741 06/29/21 1152 06/29/21 1636  GLUCAP 339* 238* 243* 203* 186*   Lipid Profile: No results for input(s): CHOL, HDL, LDLCALC, TRIG, CHOLHDL, LDLDIRECT in the last 72 hours. Thyroid Function Tests: No results for  input(s): TSH, T4TOTAL, FREET4, T3FREE, THYROIDAB in the last 72 hours. Anemia Panel: Recent Labs    06/27/21 0713 06/27/21 0911  VITAMINB12  --  192  TIBC 228*  --   IRON 41  --    Sepsis Labs: Recent Labs  Lab 06/27/21 0032 06/27/21 0239 06/27/21 0508 06/27/21 0713 06/28/21 0458 06/29/21 0601  PROCALCITON 6.83  --   --   --  5.48 3.18  LATICACIDVEN 5.7* 6.3* 5.4* 4.8*  --   --     Recent Results (from the past 240 hour(s))  Resp Panel by RT-PCR (Flu A&B, Covid) Nasopharyngeal Swab     Status: None   Collection Time: 06/27/21 12:32 AM   Specimen: Nasopharyngeal Swab; Nasopharyngeal(NP) swabs in vial transport medium  Result Value Ref Range Status   SARS Coronavirus 2 by RT PCR NEGATIVE NEGATIVE Final    Comment: (NOTE) SARS-CoV-2 target nucleic acids are NOT DETECTED.  The SARS-CoV-2 RNA is generally detectable in upper respiratory specimens during the acute phase of infection. The lowest concentration of SARS-CoV-2 viral copies this assay can detect is 138 copies/mL. A negative result does not preclude SARS-Cov-2 infection and should not be used as the sole basis for treatment or other patient management decisions. A negative result may occur with  improper specimen collection/handling, submission of specimen other than nasopharyngeal swab, presence of viral mutation(s) within the areas targeted by this assay, and inadequate number of viral copies(<138 copies/mL). A negative result must be combined with clinical observations, patient history, and epidemiological information. The expected result is Negative.  Fact Sheet for Patients:  EntrepreneurPulse.com.au  Fact Sheet for Healthcare Providers:  IncredibleEmployment.be  This test is no t yet approved or cleared by the [place] FDA and  has been authorized for detection and/or diagnosis of SARS-CoV-2 by FDA under an Emergency Use Authorization (EUA). This EUA will remain   in effect (meaning this test can be used) for the duration of the COVID-19 declaration under Section 564(b)(1) of the Act, 21 U.S.C.section 360bbb-3(b)(1), unless the authorization is terminated  or revoked sooner.       Influenza A by PCR NEGATIVE NEGATIVE Final  Influenza B by PCR NEGATIVE NEGATIVE Final    Comment: (NOTE) The Xpert Xpress SARS-CoV-2/FLU/RSV plus assay is intended as an aid in the diagnosis of influenza from Nasopharyngeal swab specimens and should not be used as a sole basis for treatment. Nasal washings and aspirates are unacceptable for Xpert Xpress SARS-CoV-2/FLU/RSV testing.  Fact Sheet for Patients: EntrepreneurPulse.com.au  Fact Sheet for Healthcare Providers: IncredibleEmployment.be  This test is not yet approved or cleared by the [place] FDA and has been authorized for detection and/or diagnosis of SARS-CoV-2 by FDA under an Emergency Use Authorization (EUA). This EUA will remain in effect (meaning this test can be used) for the duration of the COVID-19 declaration under Section 564(b)(1) of the Act, 21 U.S.C. section 360bbb-3(b)(1), unless the authorization is terminated or revoked.  Performed at [facility], [address] [place], [place] [address]   Blood culture (routine x 2)     Status: None (Preliminary result)   Collection Time: 06/27/21 12:43 AM   Specimen: BLOOD  Result Value Ref Range Status   Specimen Description BLOOD RIGHT ASSIST CONTROL  Final   Special Requests   Final    BOTTLES DRAWN AEROBIC AND ANAEROBIC Blood Culture results may not be optimal due to an inadequate volume of blood received in culture bottles   Culture   Final    NO GROWTH 2 DAYS Performed at [facility], [address], [place], [place] [address]    Report Status PENDING  Incomplete  Blood culture (routine x 2)     Status: None (Preliminary result)   Collection Time: 06/27/21  1:45 AM    Specimen: BLOOD  Result Value Ref Range Status   Specimen Description BLOOD RIGHT ASSIST CONTROL  Final   Special Requests   Final    BOTTLES DRAWN AEROBIC AND ANAEROBIC Blood Culture adequate volume   Culture   Final    NO GROWTH 2 DAYS Performed at [facility], [address], [place], [place] [address]    Report Status PENDING  Incomplete  Urine Culture     Status: Abnormal   Collection Time: 06/27/21  2:39 AM   Specimen: In/Out Cath Urine  Result Value Ref Range Status   Specimen Description   Final    IN/OUT CATH URINE Performed at [facility], [address], [place], [place] [address]    Special Requests   Final    NONE Performed at [facility], [address] [place], [place] [address]    Culture 30,000 COLONIES/mL YEAST (A)  Final   Report Status 06/28/2021 FINAL  Final         Radiology Studies: PERIPHERAL VASCULAR CATHETERIZATION  Result Date: 06/29/2021 See surgical note for result.       Scheduled Meds:  amLODipine  10 mg Oral Daily   Chlorhexidine Gluconate Cloth  6 each Topical Q0600   heparin  5,000 Units Subcutaneous Q8H   insulin aspart  0-6 Units Subcutaneous Q4H   insulin detemir  25 Units Subcutaneous BID   metoprolol tartrate  25 mg Oral BID   midazolam       sodium bicarbonate  1,300 mg Oral TID   Continuous Infusions:  sodium chloride     sodium chloride     ceFAZolin     cefTRIAXone (ROCEPHIN)  IV 2 g (06/29/21 0140)     LOS: 2 days    Time spent: 35 minutes    Max Sane, MD [facility]   To  contact the attending provider between 7A-7P or the covering provider during after hours 7P-7A, please log into the web site [email or web address] and access using universal [place] password for that web site. If you do not have the password, please call the hospital operator.  06/29/2021, 4:51 PM

## 2021-06-29 NOTE — Progress Notes (Signed)
Called to patient's bedside due to SPO2 being 91% on 6LPM oxygen via nasal cannula.  Placed on HFNC, starting at 10L, increasing to 15L, SPO2 only rose into the low 90s.  Placed patient on 45% venti mask, and SPO2 only reached 89%.  Spoke with nurse to page MD about possible CXR.  Patient also c/o pain and not being able to take deep breaths. Patient stated she did not feel SOB at the moment.

## 2021-06-29 NOTE — Progress Notes (Addendum)
[facility]  ROUNDING NOTE   Subjective:   Heather Lopez  is a 75 y.o. female who has been admitted for community acquired pneumonia. Patient with weakness for 3-4 days. She has been having shortness of breath. She was evaluated by her PCP, who diagnosed her with pneumonia and she was prescribed levofloxacin.   Patient seen resting in bed Complains of abd pain Currently NPO for procedure  Minimal creatinine improvement 6.68 (8.66) (9.14) UOP 3.3L in past 24 hours  Objective:  Vital signs in last 24 hours:  Temp:  [97.5 F (36.4 C)-99.3 F (37.4 C)] 97.9 F (36.6 C) (08/04 1150) Pulse Rate:  [76-91] 91 (08/04 1150) Resp:  [19-25] 25 (08/04 1150) BP: (148-170)/(56-66) 170/56 (08/04 1150) SpO2:  [90 %-95 %] 91 % (08/04 1150)  Weight change:  Filed Weights   06/27/21 0028 06/28/21 0100  Weight: 99.8 kg 106.8 kg    Intake/Output: I/O last 3 completed shifts: In: 2563 [P.O.:460; I.V.:2103] Out: 4900 [Urine:4900]   Intake/Output this shift:  No intake/output data recorded.  Physical Exam: General: NAD, laying in bed  Head: Normocephalic, atraumatic. Moist oral mucosal membranes  Eyes: Anicteric  Lungs:  Clear to auscultation, normal effort  Heart: Regular rate and rhythm  Abdomen:  Soft, nontender  Extremities:  trace peripheral edema.  Neurologic: Nonfocal, moving all four extremities  Skin: No lesions       Basic Metabolic Panel: Recent Labs  Lab 06/27/21 0713 06/27/21 0911 06/27/21 1459 06/27/21 2231 06/28/21 0458 06/29/21 0601  NA 130*  --  126* 129* 130* 130*  K 6.5*  --  5.9* 5.0 4.8 3.8  CL 97*  --  95* 96* 96* 95*  CO2 11*  --  11* 14* 16* 18*  GLUCOSE 187*  --  224* 261* 264* 240*  BUN 136*  --  194* 170* 163* 152*  CREATININE 9.37*  --  9.44* 9.14* 8.66* 6.68*  CALCIUM 7.9*  --  7.8* 7.7* 7.6* 8.1*  MG  --  1.7  --   --   --   --   PHOS  --   --  9.5* 9.1*  --   --      Liver Function Tests: Recent Labs  Lab 06/27/21 0713  06/27/21 1459 06/27/21 2231 06/28/21 0458  AST 20  --   --  13*  ALT 18  --   --  16  ALKPHOS 56  --   --  62  BILITOT 0.5  --   --  0.5  PROT 6.4*  --   --  6.1*  ALBUMIN 2.6* 2.7* 2.6* 2.5*    No results for input(s): LIPASE, AMYLASE in the last 168 hours. No results for input(s): AMMONIA in the last 168 hours.  CBC: Recent Labs  Lab 06/27/21 0032 06/27/21 0508 06/27/21 0713 06/28/21 0458 06/29/21 0601  WBC 24.4* 19.4* 19.2* 20.2* 20.8*  NEUTROABS 21.1*  --  17.9* 18.3*  --   HGB 10.4* 9.6* 8.9* 8.8* 9.5*  HCT 30.9* 28.7* 26.4* 25.7* 27.4*  MCV 83.7 82.5 83.8 78.4* 78.3*  PLT 483* 381 343 317 380     Cardiac Enzymes: No results for input(s): CKTOTAL, CKMB, CKMBINDEX, TROPONINI in the last 168 hours.  BNP: Invalid input(s): POCBNP  CBG: Recent Labs  Lab 06/28/21 2037 06/29/21 0012 06/29/21 0535 06/29/21 0741 06/29/21 1152  GLUCAP 382* 339* 238* 243* 203*     Microbiology: Results for orders placed or performed during the hospital encounter of  06/27/21  Resp Panel by RT-PCR (Flu A&B, Covid) Nasopharyngeal Swab     Status: None   Collection Time: 06/27/21 12:32 AM   Specimen: Nasopharyngeal Swab; Nasopharyngeal(NP) swabs in vial transport medium  Result Value Ref Range Status   SARS Coronavirus 2 by RT PCR NEGATIVE NEGATIVE Final    Comment: (NOTE) SARS-CoV-2 target nucleic acids are NOT DETECTED.  The SARS-CoV-2 RNA is generally detectable in upper respiratory specimens during the acute phase of infection. The lowest concentration of SARS-CoV-2 viral copies this assay can detect is 138 copies/mL. A negative result does not preclude SARS-Cov-2 infection and should not be used as the sole basis for treatment or other patient management decisions. A negative result may occur with  improper specimen collection/handling, submission of specimen other than nasopharyngeal swab, presence of viral mutation(s) within the areas targeted by this assay, and  inadequate number of viral copies(<138 copies/mL). A negative result must be combined with clinical observations, patient history, and epidemiological information. The expected result is Negative.  Fact Sheet for Patients:  EntrepreneurPulse.com.au  Fact Sheet for Healthcare Providers:  IncredibleEmployment.be  This test is no t yet approved or cleared by the [place] FDA and  has been authorized for detection and/or diagnosis of SARS-CoV-2 by FDA under an Emergency Use Authorization (EUA). This EUA will remain  in effect (meaning this test can be used) for the duration of the COVID-19 declaration under Section 564(b)(1) of the Act, 21 U.S.C.section 360bbb-3(b)(1), unless the authorization is terminated  or revoked sooner.       Influenza A by PCR NEGATIVE NEGATIVE Final   Influenza B by PCR NEGATIVE NEGATIVE Final    Comment: (NOTE) The Xpert Xpress SARS-CoV-2/FLU/RSV plus assay is intended as an aid in the diagnosis of influenza from Nasopharyngeal swab specimens and should not be used as a sole basis for treatment. Nasal washings and aspirates are unacceptable for Xpert Xpress SARS-CoV-2/FLU/RSV testing.  Fact Sheet for Patients: EntrepreneurPulse.com.au  Fact Sheet for Healthcare Providers: IncredibleEmployment.be  This test is not yet approved or cleared by the [place] FDA and has been authorized for detection and/or diagnosis of SARS-CoV-2 by FDA under an Emergency Use Authorization (EUA). This EUA will remain in effect (meaning this test can be used) for the duration of the COVID-19 declaration under Section 564(b)(1) of the Act, 21 U.S.C. section 360bbb-3(b)(1), unless the authorization is terminated or revoked.  Performed at [facility], [address] [place], [place] [address]   Blood culture (routine x 2)     Status: None (Preliminary result)   Collection Time:  06/27/21 12:43 AM   Specimen: BLOOD  Result Value Ref Range Status   Specimen Description BLOOD RIGHT ASSIST CONTROL  Final   Special Requests   Final    BOTTLES DRAWN AEROBIC AND ANAEROBIC Blood Culture results may not be optimal due to an inadequate volume of blood received in culture bottles   Culture   Final    NO GROWTH 2 DAYS Performed at [facility], [address], [place], [place] [address]    Report Status PENDING  Incomplete  Blood culture (routine x 2)     Status: None (Preliminary result)   Collection Time: 06/27/21  1:45 AM   Specimen: BLOOD  Result Value Ref Range Status   Specimen Description BLOOD RIGHT ASSIST CONTROL  Final   Special Requests   Final    BOTTLES DRAWN AEROBIC AND ANAEROBIC Blood Culture adequate volume   Culture   Final  NO GROWTH 2 DAYS Performed at [facility], [address] [place], [place] [address]    Report Status PENDING  Incomplete  Urine Culture     Status: Abnormal   Collection Time: 06/27/21  2:39 AM   Specimen: In/Out Cath Urine  Result Value Ref Range Status   Specimen Description   Final    IN/OUT CATH URINE Performed at [facility], [address], [place], [place] [address]    Special Requests   Final    NONE Performed at [facility], [place], [place] [address]    Culture 30,000 COLONIES/mL YEAST (A)  Final   Report Status 06/28/2021 FINAL  Final    Coagulation Studies: Recent Labs    06/27/21 0032  LABPROT 17.2*  INR 1.4*     Urinalysis: Recent Labs    06/27/21 0239  COLORURINE YELLOW*  LABSPEC 1.012  PHURINE 5.0  GLUCOSEU 50*  HGBUR LARGE*  BILIRUBINUR NEGATIVE  KETONESUR NEGATIVE  PROTEINUR 100*  NITRITE NEGATIVE  LEUKOCYTESUR LARGE*       Imaging: ECHOCARDIOGRAM COMPLETE  Result Date: 06/27/2021    ECHOCARDIOGRAM REPORT   Patient Name:   Heather Lopez Date of Exam: 06/27/2021 Medical Rec #:  [id number]  Height:       62.0 in Accession #:     [id number] Weight:       220.0 lb Date of Birth:  1946/01/18   BSA:          1.991 m Patient Age:    65 years   BP:           130/74 mmHg Patient Gender: F          HR:           79 bpm. Exam Location:  [facility] Procedure: 2D Echo, Color Doppler and Cardiac Doppler Indications:     Fever R50.9  History:         Patient has no prior history of Echocardiogram examinations.                  Risk Factors:Hypertension and Diabetes.  Sonographer:     Sherrie Sport RDCS (AE) Referring Phys:  [id number] [facility] Diagnosing Phys: Nelva Bush MD  Sonographer Comments: Suboptimal apical window. IMPRESSIONS  1. Left ventricular ejection fraction, by estimation, is 55 to 60%. The left ventricle has normal function. Left ventricular endocardial border not optimally defined to evaluate regional wall motion. There is mild left ventricular hypertrophy. Left ventricular diastolic parameters were normal.  2. Right ventricular systolic function was not well visualized. The right ventricular size is normal. Tricuspid regurgitation signal is inadequate for assessing PA pressure.  3. Left atrial size was moderately dilated.  4. The mitral valve is abnormal. No evidence of mitral valve regurgitation. No evidence of mitral stenosis.  5. The aortic valve is tricuspid. There is mild calcification of the aortic valve. There is mild thickening of the aortic valve. Aortic valve regurgitation is not visualized. Mild to moderate aortic valve sclerosis/calcification is present, without any evidence of aortic stenosis. FINDINGS  Left Ventricle: Left ventricular ejection fraction, by estimation, is 55 to 60%. The left ventricle has normal function. Left ventricular endocardial border not optimally defined to evaluate regional wall motion. The left ventricular internal cavity size was normal in size. There is mild left ventricular hypertrophy. Left ventricular diastolic parameters were normal. Right Ventricle: The right ventricular size is normal.  Right vetricular wall thickness was not well  visualized. Right ventricular systolic function was not well visualized. Tricuspid regurgitation signal is inadequate for assessing PA pressure. Left Atrium: Left atrial size was moderately dilated. Right Atrium: Right atrial size was normal in size. Pericardium: The pericardium was not well visualized. Mitral Valve: The mitral valve is abnormal. There is mild thickening of the mitral valve leaflet(s). No evidence of mitral valve regurgitation. No evidence of mitral valve stenosis. Tricuspid Valve: The tricuspid valve is not well visualized. Tricuspid valve regurgitation is trivial. Aortic Valve: The aortic valve is tricuspid. There is mild calcification of the aortic valve. There is mild thickening of the aortic valve. Aortic valve regurgitation is not visualized. Mild to moderate aortic valve sclerosis/calcification is present, without any evidence of aortic stenosis. Aortic valve mean gradient measures 4.0 mmHg. Aortic valve peak gradient measures 6.9 mmHg. Aortic valve area, by VTI measures 2.21 cm. Pulmonic Valve: The pulmonic valve was not well visualized. Pulmonic valve regurgitation is not visualized. No evidence of pulmonic stenosis. Aorta: The aortic root is normal in size and structure. Pulmonary Artery: The pulmonary artery is not well seen. Venous: The inferior vena cava was not well visualized. IAS/Shunts: The interatrial septum was not well visualized.  LEFT VENTRICLE PLAX 2D LVIDd:         4.85 cm  Diastology LVIDs:         3.21 cm  LV e' lateral:   11.20 cm/s LV PW:         1.11 cm  LV E/e' lateral: 7.8 LV IVS:        0.99 cm LVOT diam:     2.00 cm LV SV:         53 LV SV Index:   27 LVOT Area:     3.14 cm  RIGHT VENTRICLE RV Basal diam:  3.06 cm LEFT ATRIUM             Index       RIGHT ATRIUM           Index LA diam:        4.80 cm 2.41 cm/m  RA Area:     16.60 cm LA Vol (A2C):   98.8 ml 49.63 ml/m RA Volume:   45.70 ml  22.96 ml/m LA Vol (A4C):    69.4 ml 34.86 ml/m LA Biplane Vol: 89.2 ml 44.81 ml/m  AORTIC VALVE                   PULMONIC VALVE AV Area (Vmax):    2.04 cm    PV Vmax:        0.75 m/s AV Area (Vmean):   1.91 cm    PV Peak grad:   2.2 mmHg AV Area (VTI):     2.21 cm    RVOT Peak grad: 3 mmHg AV Vmax:           131.00 cm/s AV Vmean:          88.000 cm/s AV VTI:            0.239 m AV Peak Grad:      6.9 mmHg AV Mean Grad:      4.0 mmHg LVOT Vmax:         84.90 cm/s LVOT Vmean:        53.600 cm/s LVOT VTI:          0.168 m LVOT/AV VTI ratio: 0.70  AORTA Ao Root diam: 3.00 cm MITRAL VALVE MV Area (PHT): 9.37 cm  SHUNTS MV Decel Time: 81 msec     Systemic VTI:  0.17 m MV E velocity: 87.40 cm/s  Systemic Diam: 2.00 cm MV A velocity: 92.10 cm/s MV E/A ratio:  0.95 Christopher End MD Electronically signed by Nelva Bush MD Signature Date/Time: 06/27/2021/5:01:47 PM    Final      Medications:    ceFAZolin     sodium chloride     sodium chloride     sodium chloride     [START ON 06/30/2021]  ceFAZolin (ANCEF) IV     cefTRIAXone (ROCEPHIN)  IV 2 g (06/29/21 0140)    amLODipine  10 mg Oral Daily   Chlorhexidine Gluconate Cloth  6 each Topical Q0600   heparin  5,000 Units Subcutaneous Q8H   insulin aspart  0-6 Units Subcutaneous Q4H   insulin detemir  25 Units Subcutaneous BID   metoprolol tartrate  25 mg Oral BID   sodium bicarbonate  1,300 mg Oral TID     Assessment/ Plan:  Heather Lopez is a 75 y.o.  female who has been admitted for community acquired pneumonia. Patient with weakness for 3-4 days. She has been having shortness of breath. She was evaluated by her PCP, who diagnosed her with pneumonia and she was prescribed levofloxacin.   Acute Kidney Injury on chronic kidney disease stage 4 with baseline creatinine 1.97 and GFR of 25 on 12/22/19.  Acute kidney injury secondary to sepsis and obstructive uropathy Chronic kidney disease is secondary to diabetic nephropathy Continue holding losartan and metformin IVF  stopped due to respiratory distress D/c [place] Creatinine improved, but eGFR remains 6 . Based on clinical presentation of patient, will continue with current treatment plan to initiate dialysis.  Vascular contacted for permcath placement.  Once placed, patient will receive first dialysis treatment.   Lab Results  Component Value Date   CREATININE 6.68 (H) 06/29/2021   CREATININE 8.66 (H) 06/28/2021   CREATININE 9.14 (H) 06/27/2021    Intake/Output Summary (Last 24 hours) at 06/29/2021 1244 Last data filed at 06/29/2021 0600 Gross per 24 hour  Intake 460 ml  Output 3300 ml  Net -2840 ml    2. Acute Respiratory failure secondary to community acquired pneumonia failing outpatient antibiotics. Afebrile overnight. Blood cultures show no growth. Continue antibiotics  and supportive care. IV steroids, ordered  3. Anemia of chronic kidney disease Normocytic Lab Results  Component Value Date   HGB 9.5 (L) 06/29/2021   Hgb below target, but improved from yesterday Will monitor  4. Hypertension: 170/56 Continue amlodipine and metoprolol ECHO on 06/27/21 shows EF 55-60%  5. Nephrolithiasis- with obstructive uropathy and symptoms consistent with UTI. Urine culture positive for 30,000 colonies of yeast. Continue antibiotics Followed by Urology outpatient with urethral stent placement in past  6. Hyponatremia: secondary to hypovolemia Remains 130  7. Diabetes mellitus type II with chronic kidney disease insulin dependent. Most recent hemoglobin A1c is 8.3 on 06/12/21.  Metformin held  Glucose elevated. Primary team to manage with SSI   LOS: 2   8/4/202212:44 PM

## 2021-06-29 NOTE — Progress Notes (Addendum)
Cross Coverage progress note  Heather Lopez  HUD:[id number] DOB: 07-May-1946 DOA: 06/27/2021 PCP: [name], PA-C   Reason for Assessment; Patient complaining of 10 out of 10 abdominal pain, unresolved with Dilaudid  Brief Narrative:  75 year old female with history of HTN, DM, CKD 4 admitted on 8/2 with sepsis and respiratory failure, secondary to suspected UTI and pneumonia after presenting with, generalized weakness, poor appetite and vomiting.  Since admission she has had worsening acute renal failure, and preparations were being made to initiate dialysis, also unresolved respiratory failure   Assessment & Plan:  Acute abdomen secondary to perforated viscus Severe sepsis probably secondary to above -Patient with abdominal pain, tachycardia and tachypnea, respiratory failure and AKI - CT abdomen : pneumoperitoneum consistent with perforated viscus likely stomach.  Discussed with radiologist - Discussed with on-call surgeon, Dr. Hampton Abbot will take patient to the OR tonight - N.p.o. - IV Zosyn per Dr. Hampton Abbot - Discussed findings with patient.  Patient has no objection to surgical repair and I advised her that Dr. Hampton Abbot will discuss details with her further.  She did not want [place] to contact her family at this time  Acute respiratory failure with hypoxemia - ABG on Venturi mask FiO2 0.45 with pH  7.44, PCO2 26 and PO2 61 - Suspect related to acute abdomen, possible splinting.  No significant acute findings on lung bases on CT abdomen - Switch to high flow nasal cannula - To correct primary condition  Left renal mass on CT - Mass on CT likely renal cell CA per radiologist - Follow-up to be determined  Right UPJ stone with hydronephrosis - Urology consult when appropriate  AKI superimposed on CKD 4 Metabolic acidosis Lactic acidosis - Last creatinine 6.68, overall improving.  Currently awaiting dialysis - Might correct with correction of acute condition  Patient has  multiple medical comorbidities and overall prognosis guarded    CRITICAL CARE Performed by: Athena Masse   Total critical care time: 75 minutes  Critical care time was exclusive of separately billable procedures and treating other patients.  Critical care was necessary to treat or prevent imminent or life-threatening deterioration.  Critical care was time spent personally by me on the following activities: development of treatment plan with patient and/or surrogate as well as nursing, discussions with consultants, evaluation of patient's response to treatment, examination of patient, obtaining history from patient or surrogate, ordering and performing treatments and interventions, ordering and review of laboratory studies, ordering and review of radiographic studies, pulse oximetry and re-evaluation of patient's condition.   Principal Problem:   ARF (acute renal failure) (HCC) Active Problems:   Uncontrolled type 2 diabetes mellitus with insulin therapy ([place])   Hyperlipidemia associated with type 2 diabetes mellitus ([place])   Acute respiratory failure with hypoxemia (HCC)   Severe sepsis with septic shock (HCC)   Acute lower UTI   Acute renal failure (ARF) (HCC)    Subjective: Patient with abdominal pain that started about a day ago, unresolved with pain medication administered so far.  Pain is sharp mostly in lower abdomen radiating to bilateral flanks  Objective: Vitals:   06/29/21 2009 06/29/21 2121 06/29/21 2150 06/29/21 2247  BP:    137/80  Pulse:   (!) 105 (!) 106  Resp:    (!) 25  Temp:    98.7 F (37.1 C)  TempSrc:      SpO2: (!) 88% 91%  90%  Weight:      Height:  Intake/Output Summary (Last 24 hours) at 06/29/2021 2300 Last data filed at 06/29/2021 1700 Gross per 24 hour  Intake 239 ml  Output 3000 ml  Net -2761 ml   Filed Weights   06/27/21 0028 06/28/21 0100  Weight: 99.8 kg 106.8 kg    Examination:  General exam: Ill-appearing,  frail respiratory system: Tachypneic, increased work of breathing Cardiovascular system: Tachycardic, regular. Gastrointestinal system: Abdomen is nondistended, tympanic, tender in lower abdomen no organomegaly or masses felt. Normal bowel sounds heard. Central nervous system: Alert and oriented. No focal neurological deficits. Psychiatry: Judgement and insight appear normal. Mood & affect appropriate.     Data Reviewed: I have personally reviewed following labs and imaging studies  CBC: Recent Labs  Lab 06/27/21 0032 06/27/21 0508 06/27/21 0713 06/28/21 0458 06/29/21 0601  WBC 24.4* 19.4* 19.2* 20.2* 20.8*  NEUTROABS 21.1*  --  17.9* 18.3*  --   HGB 10.4* 9.6* 8.9* 8.8* 9.5*  HCT 30.9* 28.7* 26.4* 25.7* 27.4*  MCV 83.7 82.5 83.8 78.4* 78.3*  PLT 483* 381 343 317 106   Basic Metabolic Panel: Recent Labs  Lab 06/27/21 0713 06/27/21 0911 06/27/21 1459 06/27/21 2231 06/28/21 0458 06/29/21 0601  NA 130*  --  126* 129* 130* 130*  K 6.5*  --  5.9* 5.0 4.8 3.8  CL 97*  --  95* 96* 96* 95*  CO2 11*  --  11* 14* 16* 18*  GLUCOSE 187*  --  224* 261* 264* 240*  BUN 136*  --  194* 170* 163* 152*  CREATININE 9.37*  --  9.44* 9.14* 8.66* 6.68*  CALCIUM 7.9*  --  7.8* 7.7* 7.6* 8.1*  MG  --  1.7  --   --   --   --   PHOS  --   --  9.5* 9.1*  --   --    GFR: Estimated Creatinine Clearance: 8.6 mL/min (A) (by C-G formula based on SCr of 6.68 mg/dL (H)). Liver Function Tests: Recent Labs  Lab 06/27/21 0713 06/27/21 1459 06/27/21 2231 06/28/21 0458  AST 20  --   --  13*  ALT 18  --   --  16  ALKPHOS 56  --   --  62  BILITOT 0.5  --   --  0.5  PROT 6.4*  --   --  6.1*  ALBUMIN 2.6* 2.7* 2.6* 2.5*   No results for input(s): LIPASE, AMYLASE in the last 168 hours. No results for input(s): AMMONIA in the last 168 hours. Coagulation Profile: Recent Labs  Lab 06/27/21 0032  INR 1.4*   Cardiac Enzymes: No results for input(s): CKTOTAL, CKMB, CKMBINDEX, TROPONINI in the  last 168 hours. BNP (last 3 results) No results for input(s): PROBNP in the last 8760 hours. HbA1C: No results for input(s): HGBA1C in the last 72 hours. CBG: Recent Labs  Lab 06/29/21 0741 06/29/21 1152 06/29/21 1636 06/29/21 1935 06/29/21 2149  GLUCAP 243* 203* 186* 171* 152*   Lipid Profile: No results for input(s): CHOL, HDL, LDLCALC, TRIG, CHOLHDL, LDLDIRECT in the last 72 hours. Thyroid Function Tests: No results for input(s): TSH, T4TOTAL, FREET4, T3FREE, THYROIDAB in the last 72 hours. Anemia Panel: Recent Labs    06/27/21 0713 06/27/21 0911  VITAMINB12  --  192  TIBC 228*  --   IRON 41  --    Sepsis Labs: Recent Labs  Lab 06/27/21 0032 06/27/21 0239 06/27/21 0508 06/27/21 0713 06/28/21 0458 06/29/21 0601  PROCALCITON 6.83  --   --   --  5.48 3.18  LATICACIDVEN 5.7* 6.3* 5.4* 4.8*  --   --     Recent Results (from the past 240 hour(s))  Resp Panel by RT-PCR (Flu A&B, Covid) Nasopharyngeal Swab     Status: None   Collection Time: 06/27/21 12:32 AM   Specimen: Nasopharyngeal Swab; Nasopharyngeal(NP) swabs in vial transport medium  Result Value Ref Range Status   SARS Coronavirus 2 by RT PCR NEGATIVE NEGATIVE Final    Comment: (NOTE) SARS-CoV-2 target nucleic acids are NOT DETECTED.  The SARS-CoV-2 RNA is generally detectable in upper respiratory specimens during the acute phase of infection. The lowest concentration of SARS-CoV-2 viral copies this assay can detect is 138 copies/mL. A negative result does not preclude SARS-Cov-2 infection and should not be used as the sole basis for treatment or other patient management decisions. A negative result may occur with  improper specimen collection/handling, submission of specimen other than nasopharyngeal swab, presence of viral mutation(s) within the areas targeted by this assay, and inadequate number of viral copies(<138 copies/mL). A negative result must be combined with clinical observations, patient  history, and epidemiological information. The expected result is Negative.  Fact Sheet for Patients:  EntrepreneurPulse.com.au  Fact Sheet for Healthcare Providers:  IncredibleEmployment.be  This test is no t yet approved or cleared by the [place] FDA and  has been authorized for detection and/or diagnosis of SARS-CoV-2 by FDA under an Emergency Use Authorization (EUA). This EUA will remain  in effect (meaning this test can be used) for the duration of the COVID-19 declaration under Section 564(b)(1) of the Act, 21 U.S.C.section 360bbb-3(b)(1), unless the authorization is terminated  or revoked sooner.       Influenza A by PCR NEGATIVE NEGATIVE Final   Influenza B by PCR NEGATIVE NEGATIVE Final    Comment: (NOTE) The Xpert Xpress SARS-CoV-2/FLU/RSV plus assay is intended as an aid in the diagnosis of influenza from Nasopharyngeal swab specimens and should not be used as a sole basis for treatment. Nasal washings and aspirates are unacceptable for Xpert Xpress SARS-CoV-2/FLU/RSV testing.  Fact Sheet for Patients: EntrepreneurPulse.com.au  Fact Sheet for Healthcare Providers: IncredibleEmployment.be  This test is not yet approved or cleared by the [place] FDA and has been authorized for detection and/or diagnosis of SARS-CoV-2 by FDA under an Emergency Use Authorization (EUA). This EUA will remain in effect (meaning this test can be used) for the duration of the COVID-19 declaration under Section 564(b)(1) of the Act, 21 U.S.C. section 360bbb-3(b)(1), unless the authorization is terminated or revoked.  Performed at [facility], [address] [place], [place] [address]   Blood culture (routine x 2)     Status: None (Preliminary result)   Collection Time: 06/27/21 12:43 AM   Specimen: BLOOD  Result Value Ref Range Status   Specimen Description BLOOD RIGHT ASSIST CONTROL  Final    Special Requests   Final    BOTTLES DRAWN AEROBIC AND ANAEROBIC Blood Culture results may not be optimal due to an inadequate volume of blood received in culture bottles   Culture   Final    NO GROWTH 2 DAYS Performed at [facility], [address] [place], [place] [address]    Report Status PENDING  Incomplete  Blood culture (routine x 2)     Status: None (Preliminary result)   Collection Time: 06/27/21  1:45 AM   Specimen: BLOOD  Result Value Ref Range Status   Specimen Description BLOOD RIGHT ASSIST CONTROL  Final  Special Requests   Final    BOTTLES DRAWN AEROBIC AND ANAEROBIC Blood Culture adequate volume   Culture   Final    NO GROWTH 2 DAYS Performed at [facility], [address] [place], [place] [address]    Report Status PENDING  Incomplete  Urine Culture     Status: Abnormal   Collection Time: 06/27/21  2:39 AM   Specimen: In/Out Cath Urine  Result Value Ref Range Status   Specimen Description   Final    IN/OUT CATH URINE Performed at [facility], [address] [place], [place] [address]    Special Requests   Final    NONE Performed at [facility], [place], [place] [address]    Culture 30,000 COLONIES/mL YEAST (A)  Final   Report Status 06/28/2021 FINAL  Final         Radiology Studies: CT ABDOMEN PELVIS WO CONTRAST  Result Date: 06/29/2021 CLINICAL DATA:  Hypertension, diabetes, weakness for several days, acute abdominal pain, vomiting EXAM: CT ABDOMEN AND PELVIS WITHOUT CONTRAST TECHNIQUE: Multidetector CT imaging of the abdomen and pelvis was performed following the standard protocol without IV contrast. COMPARISON:  12/15/2019 FINDINGS: Lower chest: There are small bilateral pleural effusions, left greater than right. Scattered hypoventilatory changes are seen at the lung bases. Hepatobiliary: Unenhanced imaging of the liver and gallbladder demonstrates no focal abnormalities. No biliary duct  dilation. Pancreas: Unremarkable. No pancreatic ductal dilatation or surrounding inflammatory changes. Spleen: Normal in size without focal abnormality. Adrenals/Urinary Tract: There is a heterogeneous mass with ill-defined margins arising from the upper pole left kidney, measuring 6.1 x 8.0 x 4.1 cm, compatible with renal cell carcinoma. Additionally, there is an obstructing 12 mm proximal left ureteral calculus at the UPJ, with moderate left-sided hydronephrosis. Other nonobstructing left renal calculi are seen, measuring up to 18 mm. There is chronic right hydronephrosis and hydroureter without clear cause for obstruction. Nonobstructing calculi are seen within the lower pole of the right kidney measuring up to 13 mm in size. The bladder is decompressed with a Foley catheter. Adrenals are unremarkable. Stomach/Bowel: There is no bowel obstruction or ileus. Scattered diverticulosis of the sigmoid colon without evidence of acute diverticulitis. There is a large amount of free gas throughout the peritoneal cavity. Findings are consistent with a ruptured viscus. Given the gas and fluid within the central upper abdomen, this could reflect gastric perforation. Surgical consultation is recommended. Vascular/Lymphatic: There is diffuse atherosclerosis. No pathologic adenopathy within the abdomen or pelvis. Reproductive: Uterus and bilateral adnexa are unremarkable. Other: Small amount of ascites throughout the lower abdomen and pelvis. Diffuse pneumoperitoneum as described above, compatible with perforated viscus. The exact site of perforation is not identified, though the stomach is suspected given the presence of fluid and gas along the gastrohepatic ligament. No abdominal wall hernia. Musculoskeletal: There are no acute bony abnormalities. There is severe right hip osteoarthritis with more mild degenerative change on the left. Reconstructed images demonstrate no additional findings. IMPRESSION: 1. Extensive  pneumoperitoneum compatible with perforated viscus. Gastric perforation is suspected, though bowel wall defect is not definitively identified. Surgical consultation is recommended. 2. Large ill-defined mass arising from the upper pole left kidney consistent with renal cell carcinoma. Dedicated renal MRI may be useful when clinical situation permits. 3. Acute left-sided hydronephrosis due to a 12 mm left UPJ calculus. 4. Chronic right hydronephrosis and hydroureter without clear etiology. 5. Bilateral nonobstructing renal calculi as above. 6. Small volume ascites. 7. Trace  bilateral pleural effusions with patchy bibasilar atelectasis. 8.  Aortic Atherosclerosis (ICD10-I70.0). Critical Value/emergent results were called by telephone at the time of interpretation on 06/29/2021 at 959 pm to provider [facility] , who verbally acknowledged these results. Electronically Signed   By: Randa Ngo M.D.   On: 06/29/2021 22:04   PERIPHERAL VASCULAR CATHETERIZATION  Result Date: 06/29/2021 See surgical note for result.       Scheduled Meds:  amLODipine  10 mg Oral Daily   Chlorhexidine Gluconate Cloth  6 each Topical Q0600   heparin  5,000 Units Subcutaneous Q8H   insulin aspart  0-6 Units Subcutaneous Q4H   insulin detemir  30 Units Subcutaneous BID   metoprolol tartrate  25 mg Oral BID   midazolam       sodium bicarbonate  1,300 mg Oral TID   Continuous Infusions:  sodium chloride     sodium chloride 100 mL (06/29/21 2009)   ceFAZolin     cefTRIAXone (ROCEPHIN)  IV 2 g (06/29/21 0140)   piperacillin-tazobactam (ZOSYN)  IV       LOS: 2 days    Time spent: [place], MD [facility]

## 2021-06-29 NOTE — Anesthesia Preprocedure Evaluation (Signed)
Anesthesia Evaluation  Patient identified by MRN, date of birth, ID band Patient awake    Reviewed: Allergy & Precautions, H&P , NPO status , Patient's Chart, lab work & pertinent test results, reviewed documented beta blocker date and time   Airway Mallampati: III  TM Distance: <3 FB Neck ROM: full    Dental  (+) Teeth Intact   Pulmonary neg pulmonary ROS, former smoker,    Pulmonary exam normal        Cardiovascular Exercise Tolerance: Poor hypertension, On Medications negative cardio ROS Normal cardiovascular exam Rhythm:regular Rate:Normal     Neuro/Psych PSYCHIATRIC DISORDERS Anxiety Depression negative neurological ROS  negative psych ROS   GI/Hepatic negative GI ROS, Neg liver ROS,   Endo/Other  negative endocrine ROSdiabetes, Poorly Controlled  Renal/GU ESRF and DialysisRenal disease  negative genitourinary   Musculoskeletal   Abdominal   Peds  Hematology negative hematology ROS (+)   Anesthesia Other Findings Past Medical History: No date: Anxiety No date: Cataract No date: Depression No date: Diabetes mellitus without complication (HCC) No date: Gout No date: History of kidney stones No date: Hyperlipidemia No date: Hypertension No date: Vertigo Past Surgical History: 2019: CATARACT EXTRACTION, BILATERAL; Bilateral 08/19/2015: COLONOSCOPY WITH PROPOFOL; N/A     Comment:  Procedure: COLONOSCOPY WITH PROPOFOL;  Surgeon: Lucilla Lame, MD;  Location: [facility];  Service:               Endoscopy;  Laterality: N/A;  diabetic - insulin 12/22/2019: CYSTOSCOPY W/ RETROGRADES; Bilateral     Comment:  Procedure: CYSTOSCOPY WITH RETROGRADE PYELOGRAM;                Surgeon: Abbie Sons, MD;  Location: [facility];                Service: Urology;  Laterality: Bilateral; 12/22/2019: CYSTOSCOPY WITH BIOPSY; N/A     Comment:  Procedure: CYSTOSCOPY WITH bladder BIOPSY;  Surgeon:                Abbie Sons, MD;  Location: [facility];  Service:               Urology;  Laterality: N/A; 12/22/2019: CYSTOSCOPY WITH STENT PLACEMENT; Right     Comment:  Procedure: CYSTOSCOPY WITH STENT PLACEMENT;  Surgeon:               Abbie Sons, MD;  Location: [facility];  Service:               Urology;  Laterality: Right; 12/22/2019: CYSTOSCOPY WITH URETEROSCOPY; Bilateral     Comment:  Procedure: CYSTOSCOPY WITH URETEROSCOPY;  Surgeon:               Abbie Sons, MD;  Location: [facility];  Service:               Urology;  Laterality: Bilateral; 12/22/2019: CYSTOSCOPY/URETEROSCOPY/HOLMIUM LASER/STENT PLACEMENT;  Right     Comment:  Procedure: CYSTOSCOPY/URETEROSCOPY/HOLMIUM LASER/STENT               PLACEMENT;  Surgeon: Abbie Sons, MD;  Location:               [facility];  Service: Urology;  Laterality: Right; No date: EYE SURGERY 08/19/2015: POLYPECTOMY     Comment:  Procedure: POLYPECTOMY;  Surgeon: Lucilla Lame, MD;  Location: [facility];  Service: Endoscopy;; 1992: TUBAL LIGATION BMI    Body Mass Index: 43.06 kg/m     Reproductive/Obstetrics negative OB ROS                             Anesthesia Physical Anesthesia Plan  ASA: 4 and emergent  Anesthesia Plan: General ETT   Post-op Pain Management:    Induction:   PONV Risk Score and Plan: 4 or greater  Airway Management Planned: Oral ETT  Additional Equipment: Arterial line  Intra-op Plan:   Post-operative Plan: Post-operative intubation/ventilation  Informed Consent: I have reviewed the patients History and Physical, chart, labs and discussed the procedure including the risks, benefits and alternatives for the proposed anesthesia with the patient or authorized representative who has indicated his/her understanding and acceptance.     Dental Advisory Given  Plan Discussed with: CRNA  Anesthesia Plan Comments:         Anesthesia Quick Evaluation

## 2021-06-29 NOTE — Progress Notes (Signed)
Inpatient Diabetes Program Recommendations  AACE/ADA: New Consensus Statement on Inpatient Glycemic Control (2015)  Target Ranges:  Prepandial:   less than 140 mg/dL      Peak postprandial:   less than 180 mg/dL (1-2 hours)      Critically ill patients:  140 - 180 mg/dL   Lab Results  Component Value Date   GLUCAP 203 (H) 06/29/2021   HGBA1C 9.4 (A) 04/15/2018    Review of Glycemic Control Results for Heather Lopez, Heather Lopez (MRN [id number]) as of 06/29/2021 13:34  Ref. Range 06/29/2021 05:35 06/29/2021 07:41 06/29/2021 11:52  Glucose-Capillary Latest Ref Range: 70 - 99 mg/dL 238 (H) 243 (H) 203 (H)    Inpatient Diabetes Program Recommendations:    Please consider, Levemir 30 units BID.  Will continue to follow while inpatient.  Thank you, Reche Dixon, RN, BSN Diabetes Coordinator Inpatient Diabetes Program [phone] (team pager from 8a-5p)

## 2021-06-29 NOTE — Interval H&P Note (Signed)
History and Physical Interval Note:  06/29/2021 1:01 PM  Heather Lopez  has presented today for surgery, with the diagnosis of ESRD.  The various methods of treatment have been discussed with the patient and family. After consideration of risks, benefits and other options for treatment, the patient has consented to  Procedure(s): DIALYSIS/PERMA CATHETER INSERTION (N/A) as a surgical intervention.  The patient's history has been reviewed, patient examined, no change in status, stable for surgery.  I have reviewed the patient's chart and labs.  Questions were answered to the patient's satisfaction.     Leotis Pain

## 2021-06-29 NOTE — Interval H&P Note (Signed)
History and Physical Interval Note:  06/29/2021 3:26 PM  Heather Lopez  has presented today for surgery, with the diagnosis of ESRD.  The various methods of treatment have been discussed with the patient and family. After consideration of risks, benefits and other options for treatment, the patient has consented to  Procedure(s): DIALYSIS/PERMA CATHETER INSERTION (N/A) as a surgical intervention.  The patient's history has been reviewed, patient examined, no change in status, stable for surgery.  I have reviewed the patient's chart and labs.  Questions were answered to the patient's satisfaction.     Hortencia Pilar

## 2021-06-29 NOTE — Op Note (Signed)
OPERATIVE NOTE    PRE-OPERATIVE DIAGNOSIS: 1. ESRD   POST-OPERATIVE DIAGNOSIS: same as above  PROCEDURE: Ultrasound guidance for vascular access to the left internal jugular vein Fluoroscopic guidance for placement of catheter Placement of a 23 cm tip to cuff tunneled hemodialysis catheter via the left internal jugular vein  SURGEON: Leotis Pain, MD  ANESTHESIA:  Local with Moderate conscious sedation for approximately 16 minutes using 1 mg of Versed   ESTIMATED BLOOD LOSS: 5 cc  FLUORO TIME: less than one minute  CONTRAST: none  FINDING(S): 1.  Patent left internal jugular vein  SPECIMEN(S):  None  INDICATIONS:   Heather Lopez is a 75 y.o. female who presents with renal failure.  The patient needs long term dialysis access for their ESRD, and a Permcath is necessary.  Risks and benefits are discussed and informed consent is obtained.    DESCRIPTION: After obtaining full informed written consent, the patient was brought back to the vascular suited. The patient's left neck and chest were sterilely prepped and draped in a sterile surgical field was created. Moderate conscious sedation was administered during a face to face encounter with the patient throughout the procedure with my supervision of the RN administering medicines and monitoring the patient's vital signs, pulse oximetry, telemetry and mental status throughout from the start of the procedure until the patient was taken to the recovery room.  The left internal jugular vein was visualized with ultrasound and found to be patent. It was then accessed under direct ultrasound guidance and a permanent image was recorded. A wire was placed. After skin nick and dilatation, the peel-away sheath was placed over the wire. I then turned my attention to an area under the clavicle. Approximately 1-2 fingerbreadths below the clavicle a small counterincision was created and tunneled from the subclavicular incision to the access site. Using  fluoroscopic guidance, a 23 centimeter tip to cuff tunneled hemodialysis catheter was selected, and tunneled from the subclavicular incision to the access site. It was then placed through the peel-away sheath and the peel-away sheath was removed. Using fluoroscopic guidance the catheter tips were parked in the right atrium. The appropriate distal connectors were placed. It withdrew blood well and flushed easily with heparinized saline and a concentrated heparin solution was then placed. It was secured to the chest wall with 2 Prolene sutures. The access incision was closed single 4-0 Monocryl. A 4-0 Monocryl pursestring suture was placed around the exit site. Sterile dressings were placed. The patient tolerated the procedure well and was taken to the recovery room in stable condition.  COMPLICATIONS: None  CONDITION: Stable  Leotis Pain  06/29/2021, 2:08 PM   This note was created with Dragon Medical transcription system. Any errors in dictation are purely unintentional.

## 2021-06-29 NOTE — Progress Notes (Signed)
Pharmacy Antibiotic Note  Heather Lopez is a 75 y.o. female admitted on 06/27/2021 with sepsis.  Pharmacy has been consulted for Zosyn dosing. Pt had HD cath placed on 8/4.   Plan: Zosyn 2.25 gm IV Q8H ordered to start on 8/4 @ 2230.   Height: 5\' 2"  (157.5 cm) Weight: 106.8 kg (235 lb 6.4 oz) IBW/kg (Calculated) : 50.1  Temp (24hrs), Avg:98.4 F (36.9 C), Min:97.9 F (36.6 C), Max:99.3 F (37.4 C)  Recent Labs  Lab 06/27/21 0032 06/27/21 0239 06/27/21 0508 06/27/21 0713 06/27/21 1459 06/27/21 2231 06/28/21 0458 06/29/21 0601  WBC 24.4*  --  19.4* 19.2*  --   --  20.2* 20.8*  CREATININE 9.32*  --  9.14* 9.37* 9.44* 9.14* 8.66* 6.68*  LATICACIDVEN 5.7* 6.3* 5.4* 4.8*  --   --   --   --     Estimated Creatinine Clearance: 8.6 mL/min (A) (by C-G formula based on SCr of 6.68 mg/dL (H)).    Allergies  Allergen Reactions   Contrast Media  [Iodinated Diagnostic Agents] Anaphylaxis   Iodine Swelling    (IV only) - angioedema    Antimicrobials this admission:   >>    >>   Dose adjustments this admission:   Microbiology results:  BCx:   UCx:    Sputum:    MRSA PCR:   Thank you for allowing pharmacy to be a part of this patient's care.  Teresia Myint D 06/29/2021 10:43 PM

## 2021-06-30 ENCOUNTER — Encounter: Payer: Self-pay | Admitting: Vascular Surgery

## 2021-06-30 ENCOUNTER — Inpatient Hospital Stay: Payer: Medicare Other | Admitting: Anesthesiology

## 2021-06-30 ENCOUNTER — Inpatient Hospital Stay: Payer: Medicare Other

## 2021-06-30 DIAGNOSIS — K265 Chronic or unspecified duodenal ulcer with perforation: Secondary | ICD-10-CM | POA: Diagnosis not present

## 2021-06-30 DIAGNOSIS — L8990 Pressure ulcer of unspecified site, unspecified stage: Secondary | ICD-10-CM | POA: Insufficient documentation

## 2021-06-30 LAB — BLOOD GAS, ARTERIAL
Acid-base deficit: 10.5 mmol/L — ABNORMAL HIGH (ref 0.0–2.0)
Bicarbonate: 16.4 mmol/L — ABNORMAL LOW (ref 20.0–28.0)
FIO2: 0.65
MECHVT: 500 mL
O2 Saturation: 96.1 %
PEEP: 8 cmH2O
Patient temperature: 37
RATE: 14 resp/min
pCO2 arterial: 40 mmHg (ref 32.0–48.0)
pH, Arterial: 7.22 — ABNORMAL LOW (ref 7.350–7.450)
pO2, Arterial: 98 mmHg (ref 83.0–108.0)

## 2021-06-30 LAB — CBC WITH DIFFERENTIAL/PLATELET
Abs Immature Granulocytes: 0.44 10*3/uL — ABNORMAL HIGH (ref 0.00–0.07)
Basophils Absolute: 0.1 10*3/uL (ref 0.0–0.1)
Basophils Relative: 0 %
Eosinophils Absolute: 0 10*3/uL (ref 0.0–0.5)
Eosinophils Relative: 0 %
HCT: 28.8 % — ABNORMAL LOW (ref 36.0–46.0)
Hemoglobin: 10 g/dL — ABNORMAL LOW (ref 12.0–15.0)
Immature Granulocytes: 2 %
Lymphocytes Relative: 3 %
Lymphs Abs: 0.9 10*3/uL (ref 0.7–4.0)
MCH: 28.2 pg (ref 26.0–34.0)
MCHC: 34.7 g/dL (ref 30.0–36.0)
MCV: 81.1 fL (ref 80.0–100.0)
Monocytes Absolute: 1 10*3/uL (ref 0.1–1.0)
Monocytes Relative: 3 %
Neutro Abs: 27.9 10*3/uL — ABNORMAL HIGH (ref 1.7–7.7)
Neutrophils Relative %: 92 %
Platelets: 288 10*3/uL (ref 150–400)
RBC: 3.55 MIL/uL — ABNORMAL LOW (ref 3.87–5.11)
RDW: 14.7 % (ref 11.5–15.5)
Smear Review: NORMAL
WBC: 30.3 10*3/uL — ABNORMAL HIGH (ref 4.0–10.5)
nRBC: 0.2 % (ref 0.0–0.2)

## 2021-06-30 LAB — BASIC METABOLIC PANEL
Anion gap: 11 (ref 5–15)
BUN: 141 mg/dL — ABNORMAL HIGH (ref 8–23)
CO2: 19 mmol/L — ABNORMAL LOW (ref 22–32)
Calcium: 7.7 mg/dL — ABNORMAL LOW (ref 8.9–10.3)
Chloride: 100 mmol/L (ref 98–111)
Creatinine, Ser: 6.2 mg/dL — ABNORMAL HIGH (ref 0.44–1.00)
GFR, Estimated: 7 mL/min — ABNORMAL LOW (ref >60)
Glucose, Bld: 157 mg/dL — ABNORMAL HIGH (ref 70–99)
Potassium: 4.4 mmol/L (ref 3.5–5.1)
Sodium: 130 mmol/L — ABNORMAL LOW (ref 135–145)

## 2021-06-30 LAB — MAGNESIUM: Magnesium: 1.7 mg/dL (ref 1.7–2.4)

## 2021-06-30 LAB — GLUCOSE, CAPILLARY
Glucose-Capillary: 128 mg/dL — ABNORMAL HIGH (ref 70–99)
Glucose-Capillary: 203 mg/dL — ABNORMAL HIGH (ref 70–99)
Glucose-Capillary: 224 mg/dL — ABNORMAL HIGH (ref 70–99)
Glucose-Capillary: 239 mg/dL — ABNORMAL HIGH (ref 70–99)
Glucose-Capillary: 172 mg/dL — ABNORMAL HIGH (ref 70–99)
Glucose-Capillary: 236 mg/dL — ABNORMAL HIGH (ref 70–99)

## 2021-06-30 LAB — MRSA NEXT GEN BY PCR, NASAL: MRSA by PCR Next Gen: NOT DETECTED

## 2021-06-30 LAB — PHOSPHORUS: Phosphorus: 8.2 mg/dL — ABNORMAL HIGH (ref 2.5–4.6)

## 2021-06-30 LAB — LACTIC ACID, PLASMA: Lactic Acid, Venous: 1.3 mmol/L (ref 0.5–1.9)

## 2021-06-30 MED ORDER — SODIUM CHLORIDE 0.9 % IV SOLN
INTRAVENOUS | Status: DC | PRN
  Administered 2021-06-30: 80 mL

## 2021-06-30 MED ORDER — DEXMEDETOMIDINE HCL IN NACL 400 MCG/100ML IV SOLN
0.4000 ug/kg/h | INTRAVENOUS | Status: DC
  Administered 2021-06-30: 0.4 ug/kg/h via INTRAVENOUS
  Administered 2021-07-01: 0.6 ug/kg/h via INTRAVENOUS
  Administered 2021-07-02 – 2021-07-03 (×3): 0.4 ug/kg/h via INTRAVENOUS
  Administered 2021-07-03 – 2021-07-04 (×4): 0.8 ug/kg/h via INTRAVENOUS
  Administered 2021-07-07: 0.4 ug/kg/h via INTRAVENOUS
  Administered 2021-07-08: 0.6 ug/kg/h via INTRAVENOUS
  Administered 2021-07-08: 0.8 ug/kg/h via INTRAVENOUS
  Administered 2021-07-08: 0.4 ug/kg/h via INTRAVENOUS
  Administered 2021-07-08 – 2021-07-09 (×5): 0.8 ug/kg/h via INTRAVENOUS
  Administered 2021-07-10: 0.7 ug/kg/h via INTRAVENOUS
  Administered 2021-07-10 (×2): 0.8 ug/kg/h via INTRAVENOUS
  Administered 2021-07-10 – 2021-07-11 (×3): 0.7 ug/kg/h via INTRAVENOUS
  Administered 2021-07-11 (×2): 0.4 ug/kg/h via INTRAVENOUS
  Administered 2021-07-12: 0.7 ug/kg/h via INTRAVENOUS
  Administered 2021-07-12 – 2021-07-13 (×4): 0.6 ug/kg/h via INTRAVENOUS
  Administered 2021-07-13: 0.8 ug/kg/h via INTRAVENOUS
  Administered 2021-07-13 – 2021-07-14 (×4): 0.6 ug/kg/h via INTRAVENOUS
  Filled 2021-06-30 (×36): qty 100

## 2021-06-30 MED ORDER — DOCUSATE SODIUM 50 MG/5ML PO LIQD: 100.0000 mg | Freq: Two times a day (BID) | ORAL | Status: DC

## 2021-06-30 MED ORDER — ROCURONIUM BROMIDE 10 MG/ML (PF) SYRINGE
PREFILLED_SYRINGE | INTRAVENOUS | Status: AC
  Filled 2021-06-30: qty 10

## 2021-06-30 MED ORDER — FLUCONAZOLE IN SODIUM CHLORIDE 200-0.9 MG/100ML-% IV SOLN
200.0000 mg | INTRAVENOUS | Status: DC
  Administered 2021-07-01 – 2021-07-06 (×6): 200 mg via INTRAVENOUS
  Filled 2021-06-30 (×7): qty 100

## 2021-06-30 MED ORDER — ONDANSETRON HCL 4 MG/2ML IJ SOLN
INTRAMUSCULAR | Status: AC
  Filled 2021-06-30: qty 2

## 2021-06-30 MED ORDER — MIDAZOLAM HCL 2 MG/2ML IJ SOLN: 2.0000 mg | Freq: Once | INTRAMUSCULAR | Status: AC

## 2021-06-30 MED ORDER — SODIUM CHLORIDE 0.9 % IV SOLN
INTRAVENOUS | Status: DC | PRN
  Administered 2021-06-30: 50 ug/min via INTRAVENOUS

## 2021-06-30 MED ORDER — FLUCONAZOLE IN SODIUM CHLORIDE 400-0.9 MG/200ML-% IV SOLN
400.0000 mg | INTRAVENOUS | Status: DC
  Administered 2021-06-30: 400 mg via INTRAVENOUS
  Filled 2021-06-30: qty 200

## 2021-06-30 MED ORDER — SODIUM BICARBONATE 8.4 % IV SOLN: 50.0000 meq | Freq: Once | INTRAVENOUS | Status: AC

## 2021-06-30 MED ORDER — VASOPRESSIN 20 UNIT/ML IV SOLN
INTRAVENOUS | Status: DC | PRN
  Administered 2021-06-30: 1 [IU] via INTRAVENOUS
  Administered 2021-06-30: 2 [IU] via INTRAVENOUS
  Administered 2021-06-30: 1 [IU] via INTRAVENOUS
  Administered 2021-06-30: 2 [IU] via INTRAVENOUS

## 2021-06-30 MED ORDER — FENTANYL 2500MCG IN NS 250ML (10MCG/ML) PREMIX INFUSION
25.0000 ug/h | INTRAVENOUS | Status: DC
  Administered 2021-06-30: 50 ug/h via INTRAVENOUS
  Administered 2021-07-01: 100 ug/h via INTRAVENOUS
  Administered 2021-07-02: 150 ug/h via INTRAVENOUS
  Administered 2021-07-03 (×2): 100 ug/h via INTRAVENOUS
  Administered 2021-07-04: 150 ug/h via INTRAVENOUS
  Administered 2021-07-05 – 2021-07-06 (×3): 175 ug/h via INTRAVENOUS
  Administered 2021-07-06 – 2021-07-07 (×3): 200 ug/h via INTRAVENOUS
  Administered 2021-07-08: 100 ug/h via INTRAVENOUS
  Administered 2021-07-09: 125 ug/h via INTRAVENOUS
  Administered 2021-07-10 (×2): 150 ug/h via INTRAVENOUS
  Administered 2021-07-11: 100 ug/h via INTRAVENOUS
  Filled 2021-06-30 (×17): qty 250

## 2021-06-30 MED ORDER — 0.9 % SODIUM CHLORIDE (POUR BTL) OPTIME
TOPICAL | Status: DC | PRN
  Administered 2021-06-30: 2000 mL

## 2021-06-30 MED ORDER — MAGNESIUM SULFATE 2 GM/50ML IV SOLN
2.0000 g | Freq: Once | INTRAVENOUS | Status: AC
  Administered 2021-06-30: 2 g via INTRAVENOUS
  Filled 2021-06-30: qty 50

## 2021-06-30 MED ORDER — PROPOFOL 10 MG/ML IV BOLUS
INTRAVENOUS | Status: AC
  Filled 2021-06-30: qty 20

## 2021-06-30 MED ORDER — POLYETHYLENE GLYCOL 3350 17 G PO PACK
17.0000 g | PACK | Freq: Every day | ORAL | Status: DC
  Filled 2021-06-30: qty 1

## 2021-06-30 MED ORDER — PHENYLEPHRINE HCL (PRESSORS) 10 MG/ML IV SOLN
INTRAVENOUS | Status: DC | PRN
  Administered 2021-06-30: 200 ug via INTRAVENOUS
  Administered 2021-06-30: 100 ug via INTRAVENOUS
  Administered 2021-06-30: 200 ug via INTRAVENOUS

## 2021-06-30 MED ORDER — LIDOCAINE HCL (CARDIAC) PF 100 MG/5ML IV SOSY
PREFILLED_SYRINGE | INTRAVENOUS | Status: DC | PRN
  Administered 2021-06-30: 60 mg via INTRAVENOUS

## 2021-06-30 MED ORDER — ONDANSETRON HCL 4 MG/2ML IJ SOLN
INTRAMUSCULAR | Status: DC | PRN
  Administered 2021-06-30: 4 mg via INTRAVENOUS

## 2021-06-30 MED ORDER — SUCCINYLCHOLINE CHLORIDE 200 MG/10ML IV SOSY
PREFILLED_SYRINGE | INTRAVENOUS | Status: AC
  Filled 2021-06-30: qty 10

## 2021-06-30 MED ORDER — NOREPINEPHRINE 16 MG/250ML-% IV SOLN
0.0000 ug/min | INTRAVENOUS | Status: DC
  Filled 2021-06-30: qty 250

## 2021-06-30 MED ORDER — PROPOFOL 1000 MG/100ML IV EMUL
0.0000 ug/kg/min | INTRAVENOUS | Status: DC
Start: 2021-06-30 — End: 2021-06-30
  Administered 2021-06-30: 10 ug/kg/min via INTRAVENOUS
  Filled 2021-06-30: qty 100

## 2021-06-30 MED ORDER — ROCURONIUM BROMIDE 100 MG/10ML IV SOLN
INTRAVENOUS | Status: DC | PRN
  Administered 2021-06-30: 30 mg via INTRAVENOUS
  Administered 2021-06-30: 45 mg via INTRAVENOUS
  Administered 2021-06-30: 50 mg via INTRAVENOUS
  Administered 2021-06-30: 20 mg via INTRAVENOUS
  Administered 2021-06-30: 5 mg via INTRAVENOUS

## 2021-06-30 MED ORDER — PHENYLEPHRINE HCL (PRESSORS) 10 MG/ML IV SOLN
INTRAVENOUS | Status: AC
  Filled 2021-06-30: qty 1

## 2021-06-30 MED ORDER — FLUCONAZOLE IN SODIUM CHLORIDE 400-0.9 MG/200ML-% IV SOLN
400.0000 mg | Freq: Once | INTRAVENOUS | Status: AC
  Administered 2021-06-30: 400 mg via INTRAVENOUS
  Filled 2021-06-30: qty 200

## 2021-06-30 MED ORDER — DEXAMETHASONE SODIUM PHOSPHATE 10 MG/ML IJ SOLN
INTRAMUSCULAR | Status: DC | PRN
  Administered 2021-06-30: 10 mg via INTRAVENOUS

## 2021-06-30 MED ORDER — LIDOCAINE HCL (PF) 2 % IJ SOLN
INTRAMUSCULAR | Status: AC
  Filled 2021-06-30: qty 5

## 2021-06-30 MED ORDER — MIDAZOLAM HCL 2 MG/2ML IJ SOLN
INTRAMUSCULAR | Status: AC
  Administered 2021-06-30: 2 mg
  Filled 2021-06-30: qty 2

## 2021-06-30 MED ORDER — SODIUM BICARBONATE 8.4 % IV SOLN
INTRAVENOUS | Status: AC
  Administered 2021-06-30: 50 meq via INTRAVENOUS
  Filled 2021-06-30: qty 50

## 2021-06-30 MED ORDER — BUPIVACAINE-EPINEPHRINE (PF) 0.25% -1:200000 IJ SOLN
INTRAMUSCULAR | Status: AC
  Filled 2021-06-30: qty 30

## 2021-06-30 MED ORDER — INSULIN ASPART 100 UNIT/ML IJ SOLN
0.0000 [IU] | INTRAMUSCULAR | Status: DC
  Administered 2021-06-30: 1 [IU] via SUBCUTANEOUS
  Administered 2021-06-30 (×2): 2 [IU] via SUBCUTANEOUS
  Filled 2021-06-30 (×3): qty 1

## 2021-06-30 MED ORDER — PROPOFOL 10 MG/ML IV BOLUS
INTRAVENOUS | Status: DC | PRN
  Administered 2021-06-30: 50 mg via INTRAVENOUS
  Administered 2021-06-30: 40 mg via INTRAVENOUS

## 2021-06-30 MED ORDER — SODIUM CHLORIDE FLUSH 0.9 % IV SOLN
INTRAVENOUS | Status: AC
  Filled 2021-06-30: qty 10

## 2021-06-30 MED ORDER — FENTANYL BOLUS VIA INFUSION
25.0000 ug | INTRAVENOUS | Status: DC | PRN
  Administered 2021-06-30 (×4): 25 ug via INTRAVENOUS
  Administered 2021-06-30 – 2021-07-01 (×2): 50 ug via INTRAVENOUS
  Administered 2021-07-05 – 2021-07-07 (×3): 100 ug via INTRAVENOUS
  Administered 2021-07-07: 50 ug via INTRAVENOUS
  Administered 2021-07-10 – 2021-07-11 (×4): 100 ug via INTRAVENOUS
  Filled 2021-06-30: qty 100

## 2021-06-30 MED ORDER — ORAL CARE MOUTH RINSE
15.0000 mL | OROMUCOSAL | Status: DC
  Administered 2021-06-30 – 2021-07-15 (×143): 15 mL via OROMUCOSAL

## 2021-06-30 MED ORDER — TRAVASOL 10 % IV SOLN
INTRAVENOUS | Status: AC
  Filled 2021-06-30: qty 468

## 2021-06-30 MED ORDER — SUCCINYLCHOLINE CHLORIDE 200 MG/10ML IV SOSY
PREFILLED_SYRINGE | INTRAVENOUS | Status: DC | PRN
  Administered 2021-06-30: 120 mg via INTRAVENOUS

## 2021-06-30 MED ORDER — PANTOPRAZOLE SODIUM 40 MG IV SOLR
40.0000 mg | Freq: Two times a day (BID) | INTRAVENOUS | Status: DC
  Administered 2021-06-30 – 2021-07-14 (×29): 40 mg via INTRAVENOUS
  Filled 2021-06-30 (×29): qty 40

## 2021-06-30 MED ORDER — CHLORHEXIDINE GLUCONATE 0.12% ORAL RINSE (MEDLINE KIT)
15.0000 mL | Freq: Two times a day (BID) | OROMUCOSAL | Status: DC
  Administered 2021-06-30 – 2021-07-18 (×38): 15 mL via OROMUCOSAL

## 2021-06-30 MED ORDER — BUPIVACAINE LIPOSOME 1.3 % IJ SUSP
INTRAMUSCULAR | Status: AC
  Filled 2021-06-30: qty 20

## 2021-06-30 MED ORDER — FENTANYL CITRATE (PF) 100 MCG/2ML IJ SOLN
INTRAMUSCULAR | Status: DC | PRN
  Administered 2021-06-30: 50 ug via INTRAVENOUS
  Administered 2021-06-30 (×2): 25 ug via INTRAVENOUS

## 2021-06-30 MED ORDER — VASOPRESSIN 20 UNIT/ML IV SOLN
INTRAVENOUS | Status: AC
  Filled 2021-06-30: qty 1

## 2021-06-30 MED ORDER — DEXAMETHASONE SODIUM PHOSPHATE 10 MG/ML IJ SOLN
INTRAMUSCULAR | Status: AC
  Filled 2021-06-30: qty 1

## 2021-06-30 MED ORDER — SODIUM CHLORIDE (PF) 0.9 % IJ SOLN
INTRAMUSCULAR | Status: AC
  Filled 2021-06-30: qty 20

## 2021-06-30 NOTE — Progress Notes (Signed)
Initial Nutrition Assessment  DOCUMENTATION CODES:  Obesity unspecified  INTERVENTION:  Strict NPO per surgery TPN per pharmacy Pt refeeding risk, recommend monitoring K, phosphorus, and Mg x3 days and providing supplemental thiamine and folic acid in TPN bag Pt to be starting HD, would recommend 500mg  of vitamin C daily as pt is at risk for deficiency as well as a standard multivitamin/trace element  NUTRITION DIAGNOSIS:  Increased nutrient needs related to acute illness, post-op healing (sepsis, ARF on HD) as evidenced by estimated needs.  GOAL:  Provide needs based on ASPEN/SCCM guidelines  MONITOR:  I & O's, Skin, Vent status, Labs, Weight trends  REASON FOR ASSESSMENT:  Ventilator    ASSESSMENT:  Pt presented to ED with 5 days of cough, congestion, loss of appetite, nausea, dry heaving, fatigue, malaise, body aches. Dx several days PTA with PNA at PCP office but has seen no improvement despite antibiotics. PMH of DM, HTN, HLD, CKD, and gout  In ED, pt found to have acute kidney failure (hx of CKD3) and placed on BiPAP for respiratory failure. Nephrology consulted and felt pt needed emergent dialysis. PermCath placed 8/4.   Pt complained of severe abdominal pain 8/4 and imaging showed a large amount of pneumoperitoneum with associated free fluid and a new finding of a left renal mass measuring 6.1 x 8 x 4.1 cm suspicious for renal cell carcinoma. Taken emergent to OR for exploratory laparotomy. Found to have a perforated proximal duodenal ulcer which was repaired and drains placed. Pt returned to ICU intubated.  Pt intubated and sedated at the time of assessment. No family present at this time to provide a nutrition hx. Discussed in rounds, pt to be started on TPN. Intake has been poor for ~1 week and per surgery must maintain a strict NPO for at least 5 more days. No central access available currently, critical care team line will be placed at some point today. Pt to receive HD  today as well for worsening renal function.   On exam, fat stores are intact, but muscles feel significantly depleted on palpation and show signs of wasting. Noted that pt's husband reported poor mobility PTA, scheduled for hip surgery which has now been postponed. Mostly wheelchair bound at baseline recently. Based on muscle depletions, suspect that pt is a refeeding risk due to poor nutrition status at baseline and poor PO intake x 1 week.  Patient is currently intubated on ventilator support MV: 6.6 L/min Temp (24hrs), Avg:98.5 F (36.9 C), Min:97.9 F (36.6 C), Max:99.32 F (37.4 C)  Propofol: 6.41 ml/hr (provides 169 kcal/d at current rate)   Intake/Output Summary (Last 24 hours) at 06/30/2021 1145 Last data filed at 06/30/2021 1118 Gross per 24 hour  Intake 1396.2 ml  Output 1871 ml  Net -474.8 ml  Net IO Since Admission: -2,321.78 mL [06/30/21 1145]  Nutritionally Relevant Medications: Scheduled Meds:  docusate  100 mg Per Tube BID   insulin aspart  0-6 Units Subcutaneous Q4H   insulin detemir  30 Units Subcutaneous BID   polyethylene glycol  17 g Per Tube Daily   sodium bicarbonate  1,300 mg Oral TID   Continuous Infusions:  fentaNYL infusion INTRAVENOUS 75 mcg/hr (06/30/21 0900)   fluconazole (DIFLUCAN) IV 100 mL/hr at 06/30/21 0600   magnesium sulfate bolus IVPB     propofol (DIPRIVAN) infusion 10 mcg/kg/min (06/30/21 0900)   PRN Meds: loperamide, ondansetron  Labs Reviewed: Na 130 BUN 141, creatinine 6.2 Phosphorus 8.2 Magnesium 1.7 SBG ranges from 128-243 mg/dL  over the last 24 hours HgbA1c 8.3% (7/18, [place])  NUTRITION - FOCUSED PHYSICAL EXAM: Flowsheet Row Most Recent Value  Orbital Region Mild depletion  Upper Arm Region No depletion  Thoracic and Lumbar Region No depletion  Buccal Region Mild depletion  Temple Region Mild depletion  Clavicle Bone Region Mild depletion  Clavicle and Acromion Bone Region Moderate depletion  Scapular Bone  Region Mild depletion  Dorsal Hand Mild depletion  Patellar Region Moderate depletion  Anterior Thigh Region Moderate depletion  Posterior Calf Region Moderate depletion  Edema (RD Assessment) Mild  Hair Reviewed  Eyes Reviewed  Mouth Reviewed  Skin Reviewed  Nails Reviewed   Diet Order:   Diet Order             Diet NPO time specified  Diet effective now                   EDUCATION NEEDS:  Not appropriate for education at this time  Skin:  Skin Assessment: Skin Integrity Issues: Skin Integrity Issues:: Stage I, Incisions Stage I: sacrum Incisions: abdomen, drains x2  Last BM:  8/4 - type 6  Height:  Ht Readings from Last 1 Encounters:  06/27/21 5\' 2"  (1.575 m)    Weight:  Wt Readings from Last 1 Encounters:  06/30/21 102.9 kg    Ideal Body Weight:  50 kg  BMI:  Body mass index is 41.49 kg/m.  Estimated Nutritional Needs:  Kcal:  1150-1450 kcal/d Protein:  100-125 g/d Fluid:  1.2-1.5L/d   Ranell Patrick, RD, LDN Clinical Dietitian Pager on [place]

## 2021-06-30 NOTE — Progress Notes (Signed)
Pt HD ended at 1946. Pt is restless, on ventilator and primary RN is aware and present at bedside. Pt did not meet uf goal, ran full 2 hours hd tx time. Right IJ temp catheter locked with 1.22ml heparin both limbs.

## 2021-06-30 NOTE — Plan of Care (Signed)
PMT note:  Consult for [place] noted. Patient is resting in bed on ventilator. No family at bedside. Per nursing, they are trying to reach husband for dialysis consent, and have called and left a VM for call back. I called husband as well unsuccessfully. Will follow along.

## 2021-06-30 NOTE — Consult Note (Signed)
Pharmacy Antibiotic Note  Heather Lopez is a 75 y.o. female with medical history including HTN, diabetes, HLD, anxiety/depression, gout who is admitted on 06/27/2021 with  acute renal failure, acute respiratory failure secondary to pneumonia .  Hospital course complicated by perforated duodenal ulcer and patient was emergently taken to OR 8/5 for ExLap, repair of perforated duodenal ulcer with creation of omental flap. Post-operatively, patient admitted to the ICU where she remains intubated, sedated, and on mechanical ventilation. Pharmacy has been consulted for Zosyn and Diflucan dosing for intra-abdominal infection.  Scr 9.32 >> 6.2. Nephrology following. Renal function is slowly improving. Patient made 1575 mL urine yesterday. Plan for first HD session today.   Plan:  Zosyn 2.25 g IV q8h (30-minute infusion)  Patient received Diflucan 400 mg IV this AM. Will order an additional 400 mg IV for this evening to complete loading dose of 800 mg. Will follow this with Diflucan 200 mg IV q24h to start tomorrow. Timed to be given at 1800 to avoid administration prior to dialysis.  --CMP ordered for tomorrow to assess LFTs (normal on admission) --Continue to monitor for DDIs --Continue to follow renal function / RRT plan per nephrology  Height: 5\' 2"  (157.5 cm) Weight: 102.9 kg (226 lb 13.7 oz) IBW/kg (Calculated) : 50.1  Temp (24hrs), Avg:98.6 F (37 C), Min:97.9 F (36.6 C), Max:99.32 F (37.4 C)  Recent Labs  Lab 06/27/21 0032 06/27/21 0239 06/27/21 0508 06/27/21 0713 06/27/21 1459 06/27/21 2231 06/28/21 0458 06/29/21 0601 06/30/21 0320  WBC 24.4*  --  19.4* 19.2*  --   --  20.2* 20.8* 30.3*  CREATININE 9.32*  --  9.14* 9.37* 9.44* 9.14* 8.66* 6.68* 6.20*  LATICACIDVEN 5.7* 6.3* 5.4* 4.8*  --   --   --   --  1.3    Estimated Creatinine Clearance: 9.1 mL/min (A) (by C-G formula based on SCr of 6.2 mg/dL (H)).    Allergies  Allergen Reactions   Contrast Media  [Iodinated Diagnostic  Agents] Anaphylaxis   Iodine Swelling    (IV only) - angioedema    Antimicrobials this admission: Azithromycin 8/2 >> 8/4 Ceftriaxone 8/2 >> 8/4 Zosyn 8/4 >>  Diflucan 8/5 >>   Dose adjustments this admission: N/A  Microbiology results: 8/2 BCx: NGTD 8/2 UCx: 30k colonies/mL Yeast  8/5 MRSA PCR: (-)  Thank you for allowing pharmacy to be a part of this patient's care.  Benita Gutter 06/30/2021 1:32 PM

## 2021-06-30 NOTE — Progress Notes (Signed)
[facility]  ROUNDING NOTE   Subjective:   Heather Lopez is a 75 y.o. white female who has been admitted for community acquired pneumonia. Patient with weakness for 3-4 days. She has been having shortness of breath. She was evaluated by her PCP, who diagnosed her with pneumonia and she was prescribed levofloxacin.   UOP 1571mL - IV furosemide 80mg  yesterday.   Patient brought down for dialysis due to worsening uremic symptoms despite slow improvement. However dialysis catheter did not function and so she did not get dialysis.   Patient found to have a perforated duodenum. ExLap by Dr. Hampton Abbot.   Objective:  Vital signs in last 24 hours:  Temp:  [97.9 F (36.6 C)-99.32 F (37.4 C)] 98.42 F (36.9 C) (08/05 0900) Pulse Rate:  [67-110] 95 (08/05 0900) Resp:  [12-37] 16 (08/05 0900) BP: (130-174)/(56-89) 130/58 (08/05 0900) SpO2:  [84 %-100 %] 97 % (08/05 0900) Arterial Line BP: (110-149)/(44-57) 119/49 (08/05 0900) FiO2 (%):  [45 %-100 %] 65 % (08/05 0802) Weight:  [102.9 kg] 102.9 kg (08/05 0345)  Weight change:  Filed Weights   06/27/21 0028 06/28/21 0100 06/30/21 0345  Weight: 99.8 kg 106.8 kg 102.9 kg    Intake/Output: I/O last 3 completed shifts: In: 1668.4 [P.O.:460; I.V.:1046.9; IV Piggyback:161.5] Out: 2683 [MHDQQ:2297; Emesis/NG output:50; Drains:95]   Intake/Output this shift:  No intake/output data recorded.  Physical Exam: General: Critically ill  Head: ETT  Eyes: Anicteric  Lungs:  PRVC FiO2 65%  Heart: Regular rate and rhythm  Abdomen:  +JP drains  Extremities:  trace peripheral edema.  Neurologic: Nonfocal, moving all four extremities  Skin: No lesions  Access:  LIJ permcath    Basic Metabolic Panel: Recent Labs  Lab 06/27/21 0911 06/27/21 1459 06/27/21 2231 06/28/21 0458 06/29/21 0601 06/30/21 0250 06/30/21 0320  NA  --  126* 129* 130* 130*  --  130*  K  --  5.9* 5.0 4.8 3.8  --  4.4  CL  --  95* 96* 96* 95*  --  100  CO2  --   11* 14* 16* 18*  --  19*  GLUCOSE  --  224* 261* 264* 240*  --  157*  BUN  --  194* 170* 163* 152*  --  141*  CREATININE  --  9.44* 9.14* 8.66* 6.68*  --  6.20*  CALCIUM  --  7.8* 7.7* 7.6* 8.1*  --  7.7*  MG 1.7  --   --   --   --   --  1.7  PHOS  --  9.5* 9.1*  --   --  8.2*  --      Liver Function Tests: Recent Labs  Lab 06/27/21 0713 06/27/21 1459 06/27/21 2231 06/28/21 0458  AST 20  --   --  13*  ALT 18  --   --  16  ALKPHOS 56  --   --  62  BILITOT 0.5  --   --  0.5  PROT 6.4*  --   --  6.1*  ALBUMIN 2.6* 2.7* 2.6* 2.5*    No results for input(s): LIPASE, AMYLASE in the last 168 hours. No results for input(s): AMMONIA in the last 168 hours.  CBC: Recent Labs  Lab 06/27/21 0032 06/27/21 0508 06/27/21 0713 06/28/21 0458 06/29/21 0601 06/30/21 0320  WBC 24.4* 19.4* 19.2* 20.2* 20.8* 30.3*  NEUTROABS 21.1*  --  17.9* 18.3*  --  27.9*  HGB 10.4* 9.6* 8.9* 8.8* 9.5* 10.0*  HCT 30.9* 28.7* 26.4* 25.7* 27.4* 28.8*  MCV 83.7 82.5 83.8 78.4* 78.3* 81.1  PLT 483* 381 343 317 380 288     Cardiac Enzymes: No results for input(s): CKTOTAL, CKMB, CKMBINDEX, TROPONINI in the last 168 hours.  BNP: Invalid input(s): POCBNP  CBG: Recent Labs  Lab 06/29/21 1636 06/29/21 1935 06/29/21 2149 06/30/21 0312 06/30/21 0725  GLUCAP 186* 171* 152* 128* 203*     Microbiology: Results for orders placed or performed during the hospital encounter of 06/27/21  Resp Panel by RT-PCR (Flu A&B, Covid) Nasopharyngeal Swab     Status: None   Collection Time: 06/27/21 12:32 AM   Specimen: Nasopharyngeal Swab; Nasopharyngeal(NP) swabs in vial transport medium  Result Value Ref Range Status   SARS Coronavirus 2 by RT PCR NEGATIVE NEGATIVE Final    Comment: (NOTE) SARS-CoV-2 target nucleic acids are NOT DETECTED.  The SARS-CoV-2 RNA is generally detectable in upper respiratory specimens during the acute phase of infection. The lowest concentration of SARS-CoV-2 viral copies this  assay can detect is 138 copies/mL. A negative result does not preclude SARS-Cov-2 infection and should not be used as the sole basis for treatment or other patient management decisions. A negative result may occur with  improper specimen collection/handling, submission of specimen other than nasopharyngeal swab, presence of viral mutation(s) within the areas targeted by this assay, and inadequate number of viral copies(<138 copies/mL). A negative result must be combined with clinical observations, patient history, and epidemiological information. The expected result is Negative.  Fact Sheet for Patients:  EntrepreneurPulse.com.au  Fact Sheet for Healthcare Providers:  IncredibleEmployment.be  This test is no t yet approved or cleared by the [place] FDA and  has been authorized for detection and/or diagnosis of SARS-CoV-2 by FDA under an Emergency Use Authorization (EUA). This EUA will remain  in effect (meaning this test can be used) for the duration of the COVID-19 declaration under Section 564(b)(1) of the Act, 21 U.S.C.section 360bbb-3(b)(1), unless the authorization is terminated  or revoked sooner.       Influenza A by PCR NEGATIVE NEGATIVE Final   Influenza B by PCR NEGATIVE NEGATIVE Final    Comment: (NOTE) The Xpert Xpress SARS-CoV-2/FLU/RSV plus assay is intended as an aid in the diagnosis of influenza from Nasopharyngeal swab specimens and should not be used as a sole basis for treatment. Nasal washings and aspirates are unacceptable for Xpert Xpress SARS-CoV-2/FLU/RSV testing.  Fact Sheet for Patients: EntrepreneurPulse.com.au  Fact Sheet for Healthcare Providers: IncredibleEmployment.be  This test is not yet approved or cleared by the [place] FDA and has been authorized for detection and/or diagnosis of SARS-CoV-2 by FDA under an Emergency Use Authorization (EUA). This EUA will  remain in effect (meaning this test can be used) for the duration of the COVID-19 declaration under Section 564(b)(1) of the Act, 21 U.S.C. section 360bbb-3(b)(1), unless the authorization is terminated or revoked.  Performed at [facility], [address] [place], [place] [address]   Blood culture (routine x 2)     Status: None (Preliminary result)   Collection Time: 06/27/21 12:43 AM   Specimen: BLOOD  Result Value Ref Range Status   Specimen Description BLOOD RIGHT ASSIST CONTROL  Final   Special Requests   Final    BOTTLES DRAWN AEROBIC AND ANAEROBIC Blood Culture results may not be optimal due to an inadequate volume of blood received in culture bottles   Culture   Final    NO GROWTH 3 DAYS Performed  at [facility], [address] [place], [place] [address]    Report Status PENDING  Incomplete  Blood culture (routine x 2)     Status: None (Preliminary result)   Collection Time: 06/27/21  1:45 AM   Specimen: BLOOD  Result Value Ref Range Status   Specimen Description BLOOD RIGHT ASSIST CONTROL  Final   Special Requests   Final    BOTTLES DRAWN AEROBIC AND ANAEROBIC Blood Culture adequate volume   Culture   Final    NO GROWTH 3 DAYS Performed at [facility], [address], [place], [place] [address]    Report Status PENDING  Incomplete  Urine Culture     Status: Abnormal   Collection Time: 06/27/21  2:39 AM   Specimen: In/Out Cath Urine  Result Value Ref Range Status   Specimen Description   Final    IN/OUT CATH URINE Performed at [facility], [address], [place], [place] [address]    Special Requests   Final    NONE Performed at [facility], [address] [place], [place] [address]    Culture 30,000 COLONIES/mL YEAST (A)  Final   Report Status 06/28/2021 FINAL  Final  MRSA Next Gen by PCR, Nasal     Status: None   Collection Time: 06/30/21  5:01 AM   Specimen: Nasal Mucosa; Nasal Swab  Result Value  Ref Range Status   MRSA by PCR Next Gen NOT DETECTED NOT DETECTED Final    Comment: (NOTE) The GeneXpert MRSA Assay (FDA approved for NASAL specimens only), is one component of a comprehensive MRSA colonization surveillance program. It is not intended to diagnose MRSA infection nor to guide or monitor treatment for MRSA infections. Test performance is not FDA approved in patients less than 28 years old. Performed at [facility], [address] [place], [place] [address]     Coagulation Studies: No results for input(s): LABPROT, INR in the last 72 hours.   Urinalysis: No results for input(s): COLORURINE, LABSPEC, PHURINE, GLUCOSEU, HGBUR, BILIRUBINUR, KETONESUR, PROTEINUR, UROBILINOGEN, NITRITE, LEUKOCYTESUR in the last 72 hours.  Invalid input(s): APPERANCEUR     Imaging: CT ABDOMEN PELVIS WO CONTRAST  Result Date: 06/29/2021 CLINICAL DATA:  Hypertension, diabetes, weakness for several days, acute abdominal pain, vomiting EXAM: CT ABDOMEN AND PELVIS WITHOUT CONTRAST TECHNIQUE: Multidetector CT imaging of the abdomen and pelvis was performed following the standard protocol without IV contrast. COMPARISON:  12/15/2019 FINDINGS: Lower chest: There are small bilateral pleural effusions, left greater than right. Scattered hypoventilatory changes are seen at the lung bases. Hepatobiliary: Unenhanced imaging of the liver and gallbladder demonstrates no focal abnormalities. No biliary duct dilation. Pancreas: Unremarkable. No pancreatic ductal dilatation or surrounding inflammatory changes. Spleen: Normal in size without focal abnormality. Adrenals/Urinary Tract: There is a heterogeneous mass with ill-defined margins arising from the upper pole left kidney, measuring 6.1 x 8.0 x 4.1 cm, compatible with renal cell carcinoma. Additionally, there is an obstructing 12 mm proximal left ureteral calculus at the UPJ, with moderate left-sided hydronephrosis. Other nonobstructing left renal  calculi are seen, measuring up to 18 mm. There is chronic right hydronephrosis and hydroureter without clear cause for obstruction. Nonobstructing calculi are seen within the lower pole of the right kidney measuring up to 13 mm in size. The bladder is decompressed with a Foley catheter. Adrenals are unremarkable. Stomach/Bowel: There is no bowel obstruction or ileus. Scattered diverticulosis of the sigmoid colon without evidence of acute diverticulitis. There is  a large amount of free gas throughout the peritoneal cavity. Findings are consistent with a ruptured viscus. Given the gas and fluid within the central upper abdomen, this could reflect gastric perforation. Surgical consultation is recommended. Vascular/Lymphatic: There is diffuse atherosclerosis. No pathologic adenopathy within the abdomen or pelvis. Reproductive: Uterus and bilateral adnexa are unremarkable. Other: Small amount of ascites throughout the lower abdomen and pelvis. Diffuse pneumoperitoneum as described above, compatible with perforated viscus. The exact site of perforation is not identified, though the stomach is suspected given the presence of fluid and gas along the gastrohepatic ligament. No abdominal wall hernia. Musculoskeletal: There are no acute bony abnormalities. There is severe right hip osteoarthritis with more mild degenerative change on the left. Reconstructed images demonstrate no additional findings. IMPRESSION: 1. Extensive pneumoperitoneum compatible with perforated viscus. Gastric perforation is suspected, though bowel wall defect is not definitively identified. Surgical consultation is recommended. 2. Large ill-defined mass arising from the upper pole left kidney consistent with renal cell carcinoma. Dedicated renal MRI may be useful when clinical situation permits. 3. Acute left-sided hydronephrosis due to a 12 mm left UPJ calculus. 4. Chronic right hydronephrosis and hydroureter without clear etiology. 5. Bilateral  nonobstructing renal calculi as above. 6. Small volume ascites. 7. Trace bilateral pleural effusions with patchy bibasilar atelectasis. 8.  Aortic Atherosclerosis (ICD10-I70.0). Critical Value/emergent results were called by telephone at the time of interpretation on 06/29/2021 at 959 pm to provider [facility] , who verbally acknowledged these results. Electronically Signed   By: Randa Ngo M.D.   On: 06/29/2021 22:04   PERIPHERAL VASCULAR CATHETERIZATION  Result Date: 06/29/2021 See surgical note for result.    Medications:    sodium chloride     sodium chloride 999 mL/hr at 06/30/21 0010   fentaNYL infusion INTRAVENOUS 75 mcg/hr (06/30/21 0600)   fluconazole (DIFLUCAN) IV 100 mL/hr at 06/30/21 0600   magnesium sulfate bolus IVPB     norepinephrine (LEVOPHED) Adult infusion     piperacillin-tazobactam (ZOSYN)  IV 2.25 g (06/30/21 0649)   propofol (DIPRIVAN) infusion 20 mcg/kg/min (06/30/21 0600)    amLODipine  10 mg Oral Daily   Chlorhexidine Gluconate Cloth  6 each Topical Q0600   docusate  100 mg Per Tube BID   heparin  5,000 Units Subcutaneous Q8H   insulin aspart  0-6 Units Subcutaneous Q4H   insulin detemir  30 Units Subcutaneous BID   metoprolol tartrate  25 mg Oral BID   polyethylene glycol  17 g Per Tube Daily   sodium bicarbonate  1,300 mg Oral TID     Assessment/ Plan:  Ms. BARBY COLVARD is a 75 y.o.  female who has been admitted for community acquired pneumonia. Patient with weakness for 3-4 days. She has been having shortness of breath. She was evaluated by her PCP, who diagnosed her with pneumonia and she was prescribed levofloxacin.  With perforated   Acute Kidney Injury on chronic kidney disease stage 4 with baseline creatinine 1.97 and GFR of 25 on 12/22/19.  Acute kidney injury secondary to sepsis and obstructive uropathy Chronic kidney disease is secondary to diabetic nephropathy Continue holding losartan and metformin  - due to slow improvement in renal  function, will plan on try hemodialysis this afternoon. Orders prepared.   - Continue IV furosemide  - Continue PO sodium bicarbonate.   Lab Results  Component Value Date   CREATININE 6.20 (H) 06/30/2021   CREATININE 6.68 (H) 06/29/2021   CREATININE 8.66 (H) 06/28/2021  Intake/Output Summary (Last 24 hours) at 06/30/2021 0918 Last data filed at 06/30/2021 0700 Gross per 24 hour  Intake 1208.35 ml  Output 1720 ml  Net -511.65 ml    2. Acute Respiratory failure secondary to community acquired pneumonia failing outpatient antibiotics. Requiring intubation and mechanical ventilation.   3. Anemia of chronic kidney disease Normocytic Lab Results  Component Value Date   HGB 10.0 (L) 06/30/2021   Low threshold to initiate ESA  4. Hypertension: 130/58 - Continue amlodipine and metoprolol   5. Nephrolithiasis- with obstructive uropathy and symptoms consistent with UTI. Urine culture positive for 30,000 colonies of yeast. Continue antibiotics Followed by Urology outpatient with urethral stent placement in past  6. Hyponatremia: secondary to hypovolemia Remains 130  7. Diabetes mellitus type II with chronic kidney disease insulin dependent. Most recent hemoglobin A1c is 8.3 on 06/12/21.  Metformin held    LOS: 3 Orval Dortch 8/5/20229:18 AM

## 2021-06-30 NOTE — Consult Note (Addendum)
NAME:  Heather Lopez, MRN:  [id number], DOB:  1946-05-03, LOS: 3 ADMISSION DATE:  06/27/2021, CONSULTATION DATE:  06/30/21 REFERRING MD:  Hampton Abbot, MD  CHIEF COMPLAINT:  Abdominal Pain   HPI  75 y.o with significant PMH as below who presented to the ED on 06/27/21 with chief complaints of progressive shortness of breath, productive cough, loss of appetite, nausea, dry heaving fatigue, malaise and generalized body aches. Patient initially went to her PCP on 7/29 with complaints of rhinorrhea, harsh cough, loss of appetite, nausea, fatigue, malaise and generalized body aches x2 days, patient was diagnosed with pneumonia of which she received ceftriaxone 1 mg IM and sent home with Levaquin, prednisone and Tessalon.  Per ED reports, patient reported progressive worsening of her symptoms during ED visit.  ED Course: On arrival to the ED, he was afebrile with blood pressure / mm Hg and pulse rate 81 beats/min. Initial work up in the ED revealed: WBC: 24.4 Na+/ K+: 124/7.5 BUN/Cr.: 154/9.32 Glucose/CO2/AG: 206/9/22 Lactic: 5.7 Procalcitonin: 6.83 SARS Coronavirus 2 by RT PCR: Negative UA: +UTI EKG: NSR, 1st degree AV Block CXR:;Left consolidation VBG:  pO2 56; pCO2 21; pH 7.15;  HCO3 7.3, %O2 Sat 77.9. Patient was started on IVF resuscitation, also given IV bicarbonate, D50, insulin, albuterol, Kayexalate. Foley catheter was placed for close monitoring of I&Os. Covid and flu negative. Patient was started on broad spectrum antibiotics with Rocephin and Azithromycin.  Troponin was slightly elevated at 2018 concerning for possible demand ischemia. Dr. Juleen China from nephrology was consulted who recommended initiating bicarbonate drip, continuing medical resuscitation and repeat labs in the am. Nephrology did not feel like patient needed emergent dialysis overnight since she was making some urine and had no EKG changes. Patient was admitted to [facility] unit under hospitalist service.  Hospital Course: During the  course of patient's admission, patient's renal function did not improve prompting decision to proceed with renal replacement therapy. Vascular was consulted for placement of HD catheter which was placed on 8/4. On the night of 8/4, hospitalist on call was contacted due to worsening abdominal pain 10/10 associated with tachycardia and tachypnea. Given concerns for acute abdomen, CT abdomen/pelvis was obtained and showed pneumoperitoneum consistent with perforated viscus likely stomach. Case was discussed with on call General surgery Dr. Hampton Abbot who elected to take patient urgently to the OR  for exploratory laparotomy to evaluate and treat the source  Past Medical History  Diabetes mellitus type 2 Anxiety and depression Hypertension Hyperlipidemia Hydronephrosis and recurrent UTI Gout and osteoarthritis Significant Hospital Events   8/2: Admitted to [facility] unit with sepsis 8/3: Vascular consulted for HD catheter 8/4: Patient developed acute abdomen.  CT abdomen shows viscus perforation.  Patient taken to the OR urgently for exploratory lap.  Patient return to ICU on vent.  PCCM consulted  Consults:  Nephrology Vascular PCCM Procedures:  8/5: Exploratory Lap   Significant Diagnostic Tests:  8/2: Chest Xray>left basilar consolidation or atelectasis and suspected small left pleural effusion 8/2: Renal ultrasound> moderate right hydronephrosis, mild left hydronephrosis new with bilateral nephrolithiasis 8/4: CTA abdomen and pelvis> Extensive pneumoperitoneum compatible with perforated viscus. Gastric perforation is suspected, though bowel wall defect is not definitively identified. Surgical consultation is recommended. 2. Large ill-defined mass arising from the upper pole left kidney consistent with renal cell carcinoma. Dedicated renal MRI may be useful when clinical situation permits. 3. Acute left-sided hydronephrosis due to a 12 mm left UPJ calculus. 4. Chronic right hydronephrosis and  hydroureter without clear etiology.  5. Bilateral nonobstructing renal calculi as above. 6. Small volume ascites. 7. Trace bilateral pleural effusions with patchy bibasilar atelectasis.  Micro Data:  8/2: SARS-CoV-2 PCR> negative 8/2: Influenza PCR> negative 8/2: Blood culture x2> 8/2: Urine Culture> 30,000 colonies yeast 8/2: Legionella urinary antigen> negative  Antimicrobials:  8/4 Fluconazole> 8/4 Zosyn>  OBJECTIVE  Blood pressure 137/80, pulse (!) 106, temperature 98.7 F (37.1 C), resp. rate (!) 25, height 5\' 2"  (1.575 m), weight 106.8 kg, SpO2 90 %.    FiO2 (%):  [45 %] 45 %   Intake/Output Summary (Last 24 hours) at 06/30/2021 0043 Last data filed at 06/29/2021 1700 Gross per 24 hour  Intake 239 ml  Output 3000 ml  Net -2761 ml   Filed Weights   06/27/21 0028 06/28/21 0100  Weight: 99.8 kg 106.8 kg   Physical Examination  GENERAL: 75 year-old critically ill patient lying in the bed intubated, mechanically ventilated and sedated EYES: Pupils equal, round, reactive to light and accommodation. No scleral icterus. Extraocular muscles intact.  HEENT: Head atraumatic, normocephalic. Oropharynx and nasopharynx clear.  NECK:  Supple, no jugular venous distention. No thyroid enlargement, no tenderness.  LUNGS: Normal breath sounds bilaterally, no wheezing, rales,rhonchi or crepitation. No use of accessory muscles of respiration.  CARDIOVASCULAR: S1, S2 normal. No murmurs, rubs, or gallops.  ABDOMEN: Distended, Midline incision with honey comb dressing dry and intact. JP Drains x 2 EXTREMITIES: No pedal edema, cyanosis, or clubbing.  NEUROLOGIC: Cranial nerves II through XII are intact.  Muscle strength not tested patient is intubated. Sensation intact. Gait not checked.  PSYCHIATRIC: The patient is intubated and sedated SKIN: Rash under breast and abdominal folds. No OBVIOUS lesion  Labs/imaging that I havepersonally reviewed  (right click and "Reselect all SmartList  Selections" daily)     Labs   CBC: Recent Labs  Lab 06/27/21 0032 06/27/21 0508 06/27/21 0713 06/28/21 0458 06/29/21 0601  WBC 24.4* 19.4* 19.2* 20.2* 20.8*  NEUTROABS 21.1*  --  17.9* 18.3*  --   HGB 10.4* 9.6* 8.9* 8.8* 9.5*  HCT 30.9* 28.7* 26.4* 25.7* 27.4*  MCV 83.7 82.5 83.8 78.4* 78.3*  PLT 483* 381 343 317 549    Basic Metabolic Panel: Recent Labs  Lab 06/27/21 0713 06/27/21 0911 06/27/21 1459 06/27/21 2231 06/28/21 0458 06/29/21 0601  NA 130*  --  126* 129* 130* 130*  K 6.5*  --  5.9* 5.0 4.8 3.8  CL 97*  --  95* 96* 96* 95*  CO2 11*  --  11* 14* 16* 18*  GLUCOSE 187*  --  224* 261* 264* 240*  BUN 136*  --  194* 170* 163* 152*  CREATININE 9.37*  --  9.44* 9.14* 8.66* 6.68*  CALCIUM 7.9*  --  7.8* 7.7* 7.6* 8.1*  MG  --  1.7  --   --   --   --   PHOS  --   --  9.5* 9.1*  --   --    GFR: Estimated Creatinine Clearance: 8.6 mL/min (A) (by C-G formula based on SCr of 6.68 mg/dL (H)). Recent Labs  Lab 06/27/21 0032 06/27/21 0239 06/27/21 0508 06/27/21 0713 06/28/21 0458 06/29/21 0601  PROCALCITON 6.83  --   --   --  5.48 3.18  WBC 24.4*  --  19.4* 19.2* 20.2* 20.8*  LATICACIDVEN 5.7* 6.3* 5.4* 4.8*  --   --     Liver Function Tests: Recent Labs  Lab 06/27/21 0713 06/27/21 1459 06/27/21 2231 06/28/21 0458  AST  20  --   --  13*  ALT 18  --   --  16  ALKPHOS 56  --   --  62  BILITOT 0.5  --   --  0.5  PROT 6.4*  --   --  6.1*  ALBUMIN 2.6* 2.7* 2.6* 2.5*   No results for input(s): LIPASE, AMYLASE in the last 168 hours. No results for input(s): AMMONIA in the last 168 hours.  ABG    Component Value Date/Time   PHART 7.44 06/29/2021 2045   PCO2ART 26 (L) 06/29/2021 2045   PO2ART 61 (L) 06/29/2021 2045   HCO3 17.7 (L) 06/29/2021 2045   ACIDBASEDEF 5.1 (H) 06/29/2021 2045   O2SAT 91.9 06/29/2021 2045     Coagulation Profile: Recent Labs  Lab 06/27/21 0032  INR 1.4*   Cardiac Enzymes: No results for input(s): CKTOTAL, CKMB,  CKMBINDEX, TROPONINI in the last 168 hours.  CBG: Recent Labs  Lab 06/29/21 0741 06/29/21 1152 06/29/21 1636 06/29/21 1935 06/29/21 2149  GLUCAP 243* 203* 186* 171* 152*    Review of Systems:   UNABLE TO OBTAIN DUE TO PATIENT ON THE VENT AND SEDATED  Past Medical History  She,  has a past medical history of Anxiety, Cataract, Depression, Diabetes mellitus without complication ([place]), Gout, History of kidney stones, Hyperlipidemia, Hypertension, and Vertigo.   Surgical History    Past Surgical History:  Procedure Laterality Date   CATARACT EXTRACTION, BILATERAL Bilateral 2019   COLONOSCOPY WITH PROPOFOL N/A 08/19/2015   Procedure: COLONOSCOPY WITH PROPOFOL;  Surgeon: Lucilla Lame, MD;  Location: [facility];  Service: Endoscopy;  Laterality: N/A;  diabetic - insulin   CYSTOSCOPY W/ RETROGRADES Bilateral 12/22/2019   Procedure: CYSTOSCOPY WITH RETROGRADE PYELOGRAM;  Surgeon: Abbie Sons, MD;  Location: [facility];  Service: Urology;  Laterality: Bilateral;   CYSTOSCOPY WITH BIOPSY N/A 12/22/2019   Procedure: CYSTOSCOPY WITH bladder BIOPSY;  Surgeon: Abbie Sons, MD;  Location: [facility];  Service: Urology;  Laterality: N/A;   CYSTOSCOPY WITH STENT PLACEMENT Right 12/22/2019   Procedure: CYSTOSCOPY WITH STENT PLACEMENT;  Surgeon: Abbie Sons, MD;  Location: [facility];  Service: Urology;  Laterality: Right;   CYSTOSCOPY WITH URETEROSCOPY Bilateral 12/22/2019   Procedure: CYSTOSCOPY WITH URETEROSCOPY;  Surgeon: Abbie Sons, MD;  Location: [facility];  Service: Urology;  Laterality: Bilateral;   CYSTOSCOPY/URETEROSCOPY/HOLMIUM LASER/STENT PLACEMENT Right 12/22/2019   Procedure: CYSTOSCOPY/URETEROSCOPY/HOLMIUM LASER/STENT PLACEMENT;  Surgeon: Abbie Sons, MD;  Location: [facility];  Service: Urology;  Laterality: Right;   EYE SURGERY     POLYPECTOMY  08/19/2015   Procedure: POLYPECTOMY;  Surgeon: Lucilla Lame, MD;  Location: [facility];  Service: Endoscopy;;    TUBAL LIGATION  1992     Social History   reports that she quit smoking about 49 years ago. Her smoking use included cigarettes. She has a 4.50 pack-year smoking history. She has never used smokeless tobacco. She reports that she does not drink alcohol and does not use drugs.   Family History   Her family history includes Cancer in her father; Diabetes in her brother and father; Stroke in her mother.   Allergies Allergies  Allergen Reactions   Contrast Media  [Iodinated Diagnostic Agents] Anaphylaxis   Iodine Swelling    (IV only) - angioedema     Home Medications  Prior to Admission medications   Medication Sig Start Date End Date Taking? Authorizing Provider  amLODipine (NORVASC) 10 MG tablet Take 1 tablet (10 mg  total) by mouth daily. 04/15/18  Yes Mikey College, NP  aspirin EC 81 MG tablet Take 81 mg by mouth daily.   Yes [provider]  atorvastatin (LIPITOR) 20 MG tablet Take 1 tablet (20 mg total) by mouth daily. 04/15/18  Yes Mikey College, NP  benzonatate (TESSALON) 200 MG capsule Take 200 mg by mouth 3 (three) times daily as needed. 06/23/21  Yes [provider]  buPROPion (WELLBUTRIN XL) 150 MG 24 hr tablet Take 1 tablet (150 mg total) by mouth daily. 05/26/18  Yes Mikey College, NP  insulin aspart (NOVOLOG FLEXPEN) 100 UNIT/ML FlexPen Use meal coverage 6 units with every meal plus correction scale three times daily up to total of 35 units per day. 04/15/18  Yes Mikey College, NP  Insulin Pen Needle 32G X 4 MM MISC Inject insulin pens up to 5 times daily 04/15/18  Yes Mikey College, NP  Lancets (FREESTYLE) lancets USE AS INSTRUCTED 06/16/19  Yes Karamalegos, Alexander J, DO  LEVEMIR FLEXTOUCH 100 UNIT/ML Pen INJECT 50 UNITS IN THE MORNING AND 44 UNITS IN THE EVENING Patient taking differently: Inject 52 Units into the skin 2 (two) times daily. 06/30/18  Yes Mikey College, NP  levofloxacin (LEVAQUIN) 500 MG tablet Take  500 mg by mouth daily. 06/23/21  Yes [provider]  losartan (COZAAR) 50 MG tablet Take 1 tablet (50 mg total) by mouth daily. 04/15/18  Yes Mikey College, NP  metFORMIN (GLUCOPHAGE) 1000 MG tablet Take 1 tablet (1,000 mg total) by mouth 2 (two) times daily. 04/15/18  Yes Mikey College, NP  metoprolol tartrate (LOPRESSOR) 25 MG tablet Take 1 tablet (25 mg total) by mouth 2 (two) times daily. 05/26/18  Yes Mikey College, NP  Multiple Vitamin (MULTI-VITAMIN) tablet Take 1 tablet by mouth daily.    Yes [provider]  ondansetron (ZOFRAN-ODT) 4 MG disintegrating tablet Take 4 mg by mouth every 8 (eight) hours as needed. 06/23/21  Yes [provider]  predniSONE (DELTASONE) 20 MG tablet Take 20 mg by mouth 2 (two) times daily. 06/23/21  Yes [provider]  TRULICITY 3.29 JJ/8.8CZ SOPN SMARTSIG:0.5 Milliliter(s) SUB-Q Once a Week 06/19/21  Yes [provider]  Scheduled Meds:  [MAR Hold] amLODipine  10 mg Oral Daily   [MAR Hold] Chlorhexidine Gluconate Cloth  6 each Topical Q0600   [MAR Hold] heparin  5,000 Units Subcutaneous Q8H   [MAR Hold] insulin aspart  0-6 Units Subcutaneous Q4H   [MAR Hold] insulin detemir  30 Units Subcutaneous BID   [MAR Hold] metoprolol tartrate  25 mg Oral BID   [MAR Hold] sodium bicarbonate  1,300 mg Oral TID   Continuous Infusions:  [MAR Hold] sodium chloride     [MAR Hold] sodium chloride 999 mL/hr at 06/30/21 0010   [MAR Hold] piperacillin-tazobactam (ZOSYN)  IV 2.25 g (06/29/21 2333)   PRN Meds:.[MAR Hold] sodium chloride, [MAR Hold] sodium chloride, [MAR Hold] alteplase, bupivacaine liposome (EXPAREL) & bupivacaine w/epi in normal saline, [MAR Hold] heparin, [MAR Hold]  HYDROmorphone (DILAUDID) injection, [MAR Hold] lidocaine (PF), [MAR Hold] lidocaine-prilocaine, [MAR Hold] loperamide, [MAR Hold] ondansetron (ZOFRAN) IV, [MAR Hold] pentafluoroprop-tetrafluoroeth  Active Hospital Problem list    Acute hypoxic respiratory failure Severe sepsis with septic shock Bowel perforation AKI on CKD stage IV Right UPJ stone with hydronephrosis Anion gap metabolic acidosis Lactic acidosis Diabetes mellitus  Assessment & Plan:  Acute Hypoxic Respiratory Failure secondary to acute abdomen and pneumonia Now acute  ventilatory failure postop requiring mechanical ventilation -Continue ventilator support & lung protective strategies -Wean PEEP & FiO2 as tolerated, maintain SpO2 > 90% -Head of bed elevated 30 degrees, VAP protocol in place -Plateau pressures less than 30 cm H20  -Intermittent chest x-ray & ABG PRN -Daily WUA with SBT as tolerated  -Ensure adequate pulmonary hygiene  -Bronchodilators PRN -PAD protocol in place: continue Fentanyl  drip  & Propofol drip  Sepsis with Septic Shock secondary to UTI, pneumonia and now with perforated bowel Lactic: 5.7>6.3>5.4>4.8, Baseline PCT: 6.83, UA: + UTI 30,000 colonies/yeast, CXR: Left lower lobe pneumonia, CT: Chest perforation -Follow cultures, trend lactic/ PCT -Monitor WBC/ fever curve -IV antibiotics: Continue coverage with Zosyn will add antifungal coverage with Fluconazole -IVF hydration as needed -Continue Levophed and Vasopressin vasopressors to maintain MAP> 65 -Strict I/O's: Goal UOP >0.5 mL/kg/hr -If Persistent hypotension will consider stress dose steroids   Bowel perforation CT abdomen pelvis shows with free air and fluid in her abdomen consistent with viscus perforation S/p ex lap POD # 0 -Keep NPO x 5 days -Hemodynamic support as above -NG tube to intermittent suction -IV broad-spectrum antibiotics as above -Wound care per general surgery -General surgery following input appreciated  Acute kidney injury on CKD stage IV Right UPJ stone with hydronephrosis AGMA Lactic acidosis Suspected renal cell carcinoma will need further eval/biopsy -Trend lactic acid -Daily BMP, replace electrolytes PRN -Avoid nephrotoxic  agents as able,  -Ensure adequate renal perfusion -Nephrology following, appreciate input  -Replace electrolytes as indicated -CRRT may be indicated given critical illness  Diabetes mellitus -CBGs -Sliding scale insulin -Follow ICU hyper/hypoglycemia protocol -Hold home Meds    Best practice:  Diet:  NPO Pain/Anxiety/Delirium protocol (if indicated): Yes (RASS goal 0) VAP protocol (if indicated): Yes DVT prophylaxis: Subcutaneous Heparin GI prophylaxis: PPI Glucose control:  SSI Yes Central venous access:  Yes, and it is still needed Arterial line:  Yes, and it is still needed Foley:  Yes, and it is still needed Mobility:  bed rest  PT consulted: N/A Last date of multidisciplinary goals of care discussion [8/5] Code Status:  full code Disposition: ICU   = Goals of Care = Code Status Order: FULL  Primary Emergency Contact: Kinnett,Steven C, Home Phone: [phone] Wishes to pursue full aggressive treatment and intervention options, including CPR and intubation, goals of care will be addressed on going with family if that should become necessary.   Critical care time: 45 minutes     Rufina Falco, DNP, CCRN, FNP-C, AGACNP-BC Acute Care Nurse Practitioner  [facility] Pager: [phone] [place] [facility]  .

## 2021-06-30 NOTE — Op Note (Addendum)
Procedure Date:  06/30/2021  Pre-operative Diagnosis:  Pneumoperitoneum  Post-operative Diagnosis: Perforated proximal duodenal ulcer  Procedure:  Exploratory Laparotomy, primary repair of perforated duodenal ulcer with creation of omental flap.  Surgeon:  Melvyn Neth, MD  Anesthesia:  General endotracheal  Estimated Blood Loss:  30 ml  Specimens:  None  Complications:  None  Findings:  The patient had a 2 cm anterior perforation of the proximal duodenum, with pneumoperitoneum and gastric and bilious spillage into the abdominal cavity, resulting in intra-abdominal infection.  Indications for Procedure:  This is a 75 y.o. female with worsening abdominal pain and peritonitis and workup revealing pneumoperitoneum.  The risks of bleeding, abscess or infection, injury to surrounding structures, and need for further procedures were all discussed with the patient and was willing to proceed.  Description of Procedure: The patient was correctly identified in the preoperative area and brought into the operating room.  The patient was placed supine with VTE prophylaxis in place.  Appropriate time-outs were performed.  Anesthesia was induced and the patient was intubated.  Foley catheter was already in place.  Appropriate antibiotics were infused.  The abdomen was prepped and draped in a sterile fashion.  A midline incision was made and electrocautery was used to dissect down the subcutaneous tissue to the fascia.  The fascia was incised and extended superiorly and inferiorly.  Balfour retractor was placed.  The abdomen was explored revealing 2.5 liters of gastric/bilious contents throughout the abdominal cavity, which had resulted in an intra-abdominal infection and part of her peritonitis.  This was suctioned out and then the abdominal cavity was further explored.  This immediately revealed a perforation in the very proximal portion of the duodenum just past the pylorus.  It was about 2 cm in  size, located anteriorly.  The edges had minimal necrosis and were appropriate for approximation.  The perforation was primarily repaired with multiple 2-0 Silk sutures, oriented in transverse fashion to prevent any stenosis.  After that repair was completed, we explored the rest of the abdominal cavity.  We ran the small bowel from Ligament of Treitz to ileocecal valve and there were no small bowel injuries or perforation. The colon was also evaluated and did not show any injury, perforation, or inflammatory changes that could have explained her pneumoperitoneum.  At that point, I confirmed the NG tube placement to be in appropriate position within the stomach.  I mobilized a pedicle of proximal omentum using LigaSure and placed it over the duodenal perforation repair and secured it in place as an omental flap/patch over the repair using 3-0 Silk sutures.  There was no tension in the repair.  The omentum appeared healthy and did not become ischemic after the sutures were placed.  Then the entire abdominal cavity was irrigated thoroughly using warm saline in all quadrants and pelvis.  Two 19 Fr Blake drains were placed intraperitoneally, with a superior drain going near the repair site, and an inferior one going to the pelvis for dependent drainage.  80 ml of Exparel solution combined with 0.25% bupivacaine with epi and saline were infiltrated onto the peritoneum, fascia, and subcutaneous tissue.  The fascia was closed using #1 PDS sutures.  The midline wound was irrigated and closed using staples over a 1/4 inch penrose drain.  The drains were secured to the skin using 3-0 Nylon.  The wound was dressed with Honeycomb dressing and the drains with 4x4 gauze and tegaderm.   After discussing further with the anesthesia team,  it was decided to keep the patient intubated due to her high O2 requirements prior to surgery.  She was then transferred to the ICU for further management.  The patient tolerated the procedure  well and all counts were correct at the end of the case.   Melvyn Neth, MD

## 2021-06-30 NOTE — Progress Notes (Signed)
   06/29/21 2247  Assess: MEWS Score  Temp 98.7 F (37.1 C)  BP 137/80  Pulse Rate (!) 106  Resp (!) 25  SpO2 90 %  O2 Device Simple Mask  O2 Flow Rate (L/min) 10 L/min  Assess: MEWS Score  MEWS Temp 0  MEWS Systolic 0  MEWS Pulse 1  MEWS RR 1  MEWS LOC 0  MEWS Score 2  MEWS Score Color Yellow  Assess: SIRS CRITERIA  SIRS Temperature  0  SIRS Pulse 1  SIRS Respirations  1  SIRS WBC 0  SIRS Score Sum  2  Sent patient to PACU for surgery, ordered by Dr Hampton Abbot. At the time of transfer, Pt is still complaining abdomen pain, Pt is wearing oxygen mask with 10L, Gave the report to the PACU nurse and Pt's Husband at bedside.

## 2021-06-30 NOTE — Transfer of Care (Signed)
Immediate Anesthesia Transfer of Care Note  Patient: Heather Lopez  Procedure(s) Performed: EXPLORATORY LAPAROTOMY WITH REPAIR OF DUODENAL PERFORATION (Abdomen)  Patient Location: ICU  Anesthesia Type:General  Level of Consciousness: sedated and Patient remains intubated per anesthesia plan  Airway & Oxygen Therapy: Patient remains intubated per anesthesia plan and Patient placed on Ventilator (see vital sign flow sheet for setting)  Post-op Assessment: Report given to RN and Post -op Vital signs reviewed and stable  Post vital signs: Reviewed and stable  Last Vitals:  Vitals Value Taken Time  BP 122/72   Temp 37.7 C 06/30/21 0319  Pulse 92 06/30/21 0319  Resp 17 06/30/21 0319  SpO2 97 % 06/30/21 0319  Vitals shown include unvalidated device data.  Last Pain:  Vitals:   06/29/21 2230  TempSrc:   PainSc: 8       Patients Stated Pain Goal: 0 (13/24/40 1027)  Complications: No notable events documented.

## 2021-06-30 NOTE — Progress Notes (Signed)
[place] [facility] Day(s): 3.   Post op day(s): 1 Day Post-Op.   Interval History:  Patient seen and examined the morning of surgery Patient remains intubated and sedated Most recent leukocytosis is 30.3K BMP remains consistent with ESRD Lactic acidosis now resolved at 1.3 NGT output since surgery 50 ccs; bilious Surgical drain x2 in RLQ with 15 and 80 ccs out; serosanguinous   Vital signs in last 24 hours: [min-max] current  Temp:  [97.9 F (36.6 C)-99.32 F (37.4 C)] 98.42 F (36.9 C) (08/05 0700) Pulse Rate:  [67-110] 93 (08/05 0700) Resp:  [12-37] 16 (08/05 0700) BP: (137-174)/(56-89) 137/80 (08/04 2247) SpO2:  [84 %-98 %] 96 % (08/05 0700) Arterial Line BP: (110-149)/(44-57) 110/44 (08/05 0700) FiO2 (%):  [45 %] 45 % (08/04 2121) Weight:  [102.9 kg] 102.9 kg (08/05 0345)     Height: 5\' 2"  (157.5 cm) Weight: 102.9 kg BMI (Calculated): 41.48   Intake/Output last 2 shifts:  08/04 0701 - 08/05 0700 In: 1208.4 [I.V.:1046.9; IV Piggyback:161.5] Out: 1720 [Urine:1575; Emesis/NG output:50; Drains:95]   Physical Exam:  Constitutional: intubated, sedated HEENT: NGT in place; output bilious Respiratory: intubated Cardiovascular: regular rate and sinus rhythm  Gastrointestinal: Soft, unable to reliably assess tenderness, non-distended, surgical drains x2 in right lower abdomen, output serosanguinous Integumentary: Midline incision is CDI with staples, honeycomb in place   Labs:  CBC Latest Ref Rng & Units 06/30/2021 06/29/2021 06/28/2021  WBC 4.0 - 10.5 K/uL 30.3(H) 20.8(H) 20.2(H)  Hemoglobin 12.0 - 15.0 g/dL 10.0(L) 9.5(L) 8.8(L)  Hematocrit 36.0 - 46.0 % 28.8(L) 27.4(L) 25.7(L)  Platelets 150 - 400 K/uL 288 380 317   CMP Latest Ref Rng & Units 06/30/2021 06/29/2021 06/28/2021  Glucose 70 - 99 mg/dL 157(H) 240(H) 264(H)  BUN 8 - 23 mg/dL 141(H) 152(H) 163(H)  Creatinine 0.44 - 1.00 mg/dL 6.20(H) 6.68(H) 8.66(H)  Sodium 135 - 145 mmol/L  130(L) 130(L) 130(L)  Potassium 3.5 - 5.1 mmol/L 4.4 3.8 4.8  Chloride 98 - 111 mmol/L 100 95(L) 96(L)  CO2 22 - 32 mmol/L 19(L) 18(L) 16(L)  Calcium 8.9 - 10.3 mg/dL 7.7(L) 8.1(L) 7.6(L)  Total Protein 6.5 - 8.1 g/dL - - 6.1(L)  Total Bilirubin 0.3 - 1.2 mg/dL - - 0.5  Alkaline Phos 38 - 126 U/L - - 62  AST 15 - 41 U/L - - 13(L)  ALT 0 - 44 U/L - - 16    Imaging studies: No new pertinent imaging studies   Assessment/Plan:  75 y.o. female requiring ventilator support 1 Day Post-Op s/p exploratory laparotomy and primary repair of duodenal ulcer with omental patch   - Greatly appreciate PCCM assistance; ventilatory support - Continue NGT decompression; LIS; monitor ad record output. She will likely need this for ~5 days prior to reassessing repair  - Continue NPO + IVF - Continue IV Abx (Zosyn) - Monitor abdominal examination; on-going bowel function - Maintain surgical drains; monitor and record output   All of the above findings and recommendations were discussed with the medical team.  -- Edison Simon, PA-C [facility] 06/30/2021, 7:22 AM [phone] M-F: 7am - 4pm

## 2021-06-30 NOTE — Anesthesia Procedure Notes (Signed)
Arterial Line Insertion Start/End8/03/2021 12:34 AM, 06/30/2021 12:38 AM Performed by: [name], MD, anesthesiologist  Patient location: OR. Preanesthetic checklist: patient identified, IV checked, site marked, risks and benefits discussed, surgical consent, monitors and equipment checked, pre-op evaluation, timeout performed and anesthesia consent Left, radial was placed Catheter size: 20 G Hand hygiene performed  and maximum sterile barriers used   Attempts: 1 Procedure performed without using ultrasound guided technique. Following insertion, dressing applied. Post procedure assessment: normal and unchanged  Patient tolerated the procedure well with no immediate complications.

## 2021-06-30 NOTE — Consult Note (Signed)
PHARMACY - TOTAL PARENTERAL NUTRITION CONSULT NOTE   Indication:  Prolonged NPO  Patient Measurements: Height: 5\' 2"  (157.5 cm) Weight: 102.9 kg (226 lb 13.7 oz) IBW/kg (Calculated) : 50.1 TPN AdjBW (KG): 62.5 Body mass index is 41.49 kg/m.  Assessment:  Patient is a [name] with medical history including HTN, diabetes, HLD, anxiety / depression, gout who was admitted 8/2 with acute renal failure, acute respiratory failure secondary to pneumonia. Hospital course complicated by perforated duodenal ulcer and patient was emergently taken to OR 8/5 for ExLap, repair of perforated duodenal ulcer with creation of omental flap. Post-operatively, patient admitted to the ICU where she remains intubated, sedated, and on mechanical ventilation. Pharmacy has been consulted to initiate TPN for prolonged NPO status.   Glucose / Insulin: Last A1c 8.3%. Inpatient diabetes control with Levemir 25u BID + SSI. Glucoses 128 - 203 last 24h (5u SSI administered over period) Electrolytes: Hyponatremia, hyperphosphatemia, magnesium at LLN Renal: Scr 9.32 >> 6.2. Nephrology following. Plan to have first dialysis session 8/5. Renal function is slowly improving Hepatic: Within normal limits on admission Intake / Output; MIVF: Net negative 2.3L for the admission. NGT to LIS (95 mL out yesterday). 95 mL out of abdominal drains yesterday. She made 1.7L of urine yesterday.  GI Imaging: 8/4 CT Abdomen / pelvis: Extensive pneumoperitoneum compatible with perforated viscus. Large ill-defined mass arising from the upper pole left kidney consistent with renal cell carcinoma.  Acute left-sided hydronephrosis due to a 12 mm left UPJ calculus. Chronic right hydronephrosis and hydroureter without clear etiology. GI Surgeries / Procedures:  8/5: ExLap with repair of perforated duodenal ulcer with creation of omental flap  Central access: 8/5 (pending) TPN start date:  8/5 (if central access obtained)  Nutritional Goals (per RD  recommendation on 8/5): kCal: 1150 - 1450 kcal/day, Protein: 100 - 125 g/day, Fluid: 1.2 - 1.5 L/day  Goal TPN rate is 65 mL/hr (provides 101 g of protein and 1379 kcals per day)  Current Nutrition:  NPO  Plan:  --Start TPN at 30 mL/hr at 1800 (if appropriate central access is established) Amino acids: 46.8 g Dextrose: 79.2 g Lipids: 18 g Kcal: 636 Fluid: 720 mL --Electrolytes in TPN: Na 62mEq/L, K 88mEq/L, Ca 47mEq/L, Mg 18mEq/L, and Phos 70mmol/L. Cl:Ac 1:2 Decreased potassium concentration given renal dysfunction (21.6 mEq provided in this bag) Decrease phosphorous concentration given renal dysfunction / hyperphosphatemia (3.6 mmol in this bag) Increase acetate based on metabolic acidosis --Add standard MVI and trace elements to TPN Hold chromium for now given renal dysfunction Add thiamine / folic acid x 3 days (day 1 / 3) Consider adding Vitamin C 500 mg IV daily per RD recommendations. Un-able to add this into bag --Continue Sensitive q4h SSI and adjust as needed. Holding Levemir for now. Plan to add insulin to TPN once better understanding of requirements are determined --No MIVF ordered at this time --Monitor TPN labs on Mon/Thurs, daily until stable  Benita Gutter 06/30/2021,11:09 AM

## 2021-06-30 NOTE — Procedures (Signed)
Central Venous Catheter Insertion Procedure Note  YOKO MCGAHEE  [id number]  01/14/1946  Date:06/30/21  Time:3:00 PM   Provider Performing:Donell Tomkins Jannette Fogo   Procedure: Insertion of Non-tunneled Central Venous Catheter(36556)with US guidance (40981)    Indication(s) Hemodialysis  Consent Risks of the procedure as well as the alternatives and risks of each were explained to the patient and/or caregiver.  Consent for the procedure was obtained and is signed in the bedside chart  Anesthesia Topical only with 1% lidocaine   Timeout Verified patient identification, verified procedure, site/side was marked, verified correct patient position, special equipment/implants available, medications/allergies/relevant history reviewed, required imaging and test results available.  Sterile Technique Maximal sterile technique including full sterile barrier drape, hand hygiene, sterile gown, sterile gloves, mask, hair covering, sterile ultrasound probe cover (if used).  Procedure Description Area of catheter insertion was cleaned with chlorhexidine and draped in sterile fashion.   With real-time ultrasound guidance a HD catheter was placed into the right internal jugular vein.  Nonpulsatile blood flow and easy flushing noted in all ports.  The catheter was sutured in place and sterile dressing applied.  Complications/Tolerance None; patient tolerated the procedure well. Chest X-ray is ordered to verify placement for internal jugular or subclavian cannulation.  Chest x-ray is not ordered for femoral cannulation.  EBL Minimal  Specimen(s) None  Rosilyn Mings, AGNP  Pulmonary/Critical Care Pager [phone] (please enter 7 digits) PCCM Consult Pager [phone] (please enter 7 digits)

## 2021-06-30 NOTE — Anesthesia Procedure Notes (Signed)
Procedure Name: Intubation Date/Time: 06/30/2021 12:29 AM Performed by: Aline Brochure, CRNA Pre-anesthesia Checklist: Patient identified, Emergency Drugs available, Suction available and Patient being monitored Patient Re-evaluated:Patient Re-evaluated prior to induction Oxygen Delivery Method: Circle system utilized Preoxygenation: Pre-oxygenation with 100% oxygen Induction Type: IV induction and Rapid sequence Laryngoscope Size: McGraph and 3 Grade View: Grade I Tube type: Oral Tube size: 7.0 mm Number of attempts: 1 Airway Equipment and Method: Stylet and Video-laryngoscopy Placement Confirmation: ETT inserted through vocal cords under direct vision, positive ETCO2 and breath sounds checked- equal and bilateral Secured at: 22 cm Tube secured with: Tape Dental Injury: Teeth and Oropharynx as per pre-operative assessment

## 2021-07-01 LAB — CBC WITH DIFFERENTIAL/PLATELET
Abs Immature Granulocytes: 0.68 10*3/uL — ABNORMAL HIGH (ref 0.00–0.07)
Basophils Absolute: 0.1 10*3/uL (ref 0.0–0.1)
Basophils Relative: 0 %
Eosinophils Absolute: 0 10*3/uL (ref 0.0–0.5)
Eosinophils Relative: 0 %
HCT: 23 % — ABNORMAL LOW (ref 36.0–46.0)
Hemoglobin: 7.8 g/dL — ABNORMAL LOW (ref 12.0–15.0)
Immature Granulocytes: 2 %
Lymphocytes Relative: 2 %
Lymphs Abs: 0.5 10*3/uL — ABNORMAL LOW (ref 0.7–4.0)
MCH: 27.5 pg (ref 26.0–34.0)
MCHC: 33.9 g/dL (ref 30.0–36.0)
MCV: 81 fL (ref 80.0–100.0)
Monocytes Absolute: 1 10*3/uL (ref 0.1–1.0)
Monocytes Relative: 3 %
Neutro Abs: 28.1 10*3/uL — ABNORMAL HIGH (ref 1.7–7.7)
Neutrophils Relative %: 93 %
Platelets: 231 10*3/uL (ref 150–400)
RBC: 2.84 MIL/uL — ABNORMAL LOW (ref 3.87–5.11)
RDW: 15 % (ref 11.5–15.5)
WBC: 30.3 10*3/uL — ABNORMAL HIGH (ref 4.0–10.5)
nRBC: 0 % (ref 0.0–0.2)

## 2021-07-01 LAB — MAGNESIUM: Magnesium: 2.3 mg/dL (ref 1.7–2.4)

## 2021-07-01 LAB — COMPREHENSIVE METABOLIC PANEL
ALT: 44 U/L (ref 0–44)
AST: 43 U/L — ABNORMAL HIGH (ref 15–41)
Albumin: 1.9 g/dL — ABNORMAL LOW (ref 3.5–5.0)
Alkaline Phosphatase: 61 U/L (ref 38–126)
Anion gap: 12 (ref 5–15)
BUN: 110 mg/dL — ABNORMAL HIGH (ref 8–23)
CO2: 22 mmol/L (ref 22–32)
Calcium: 8.6 mg/dL — ABNORMAL LOW (ref 8.9–10.3)
Chloride: 101 mmol/L (ref 98–111)
Creatinine, Ser: 5.4 mg/dL — ABNORMAL HIGH (ref 0.44–1.00)
GFR, Estimated: 8 mL/min — ABNORMAL LOW (ref >60)
Glucose, Bld: 320 mg/dL — ABNORMAL HIGH (ref 70–99)
Potassium: 4.5 mmol/L (ref 3.5–5.1)
Sodium: 135 mmol/L (ref 135–145)
Total Bilirubin: 1.1 mg/dL (ref 0.3–1.2)
Total Protein: 5.7 g/dL — ABNORMAL LOW (ref 6.5–8.1)

## 2021-07-01 LAB — GLUCOSE, CAPILLARY
Glucose-Capillary: 272 mg/dL — ABNORMAL HIGH (ref 70–99)
Glucose-Capillary: 293 mg/dL — ABNORMAL HIGH (ref 70–99)
Glucose-Capillary: 195 mg/dL — ABNORMAL HIGH (ref 70–99)
Glucose-Capillary: 259 mg/dL — ABNORMAL HIGH (ref 70–99)
Glucose-Capillary: 273 mg/dL — ABNORMAL HIGH (ref 70–99)

## 2021-07-01 LAB — PHOSPHORUS: Phosphorus: 8.8 mg/dL — ABNORMAL HIGH (ref 2.5–4.6)

## 2021-07-01 LAB — PREALBUMIN: Prealbumin: 5.9 mg/dL — ABNORMAL LOW (ref 18–38)

## 2021-07-01 LAB — TRIGLYCERIDES: Triglycerides: 108 mg/dL (ref <150)

## 2021-07-01 MED ORDER — INSULIN DETEMIR 100 UNIT/ML ~~LOC~~ SOLN
10.0000 [IU] | Freq: Every day | SUBCUTANEOUS | Status: DC
  Administered 2021-07-01: 10 [IU] via SUBCUTANEOUS
  Filled 2021-07-01: qty 0.1

## 2021-07-01 MED ORDER — EPOETIN ALFA 10000 UNIT/ML IJ SOLN
4000.0000 [IU] | Freq: Once | INTRAMUSCULAR | Status: AC
  Administered 2021-07-01: 4000 [IU] via INTRAVENOUS
  Filled 2021-07-01: qty 1

## 2021-07-01 MED ORDER — TRAVASOL 10 % IV SOLN
INTRAVENOUS | Status: AC
  Filled 2021-07-01: qty 1014

## 2021-07-01 MED ORDER — INSULIN ASPART 100 UNIT/ML IJ SOLN
0.0000 [IU] | INTRAMUSCULAR | Status: DC
  Administered 2021-07-01 (×2): 11 [IU] via SUBCUTANEOUS
  Administered 2021-07-01: 4 [IU] via SUBCUTANEOUS
  Administered 2021-07-01: 11 [IU] via SUBCUTANEOUS
  Administered 2021-07-02: 20 [IU] via SUBCUTANEOUS
  Administered 2021-07-02 (×2): 4 [IU] via SUBCUTANEOUS
  Administered 2021-07-02: 20 [IU] via SUBCUTANEOUS
  Administered 2021-07-02: 4 [IU] via SUBCUTANEOUS
  Administered 2021-07-02: 11 [IU] via SUBCUTANEOUS
  Administered 2021-07-03 (×6): 7 [IU] via SUBCUTANEOUS
  Administered 2021-07-04: 4 [IU] via SUBCUTANEOUS
  Administered 2021-07-04: 11 [IU] via SUBCUTANEOUS
  Administered 2021-07-04: 4 [IU] via SUBCUTANEOUS
  Administered 2021-07-04: 7 [IU] via SUBCUTANEOUS
  Administered 2021-07-04: 11 [IU] via SUBCUTANEOUS
  Administered 2021-07-04: 4 [IU] via SUBCUTANEOUS
  Administered 2021-07-04 – 2021-07-05 (×3): 7 [IU] via SUBCUTANEOUS
  Administered 2021-07-05 (×2): 20 [IU] via SUBCUTANEOUS
  Administered 2021-07-05: 7 [IU] via SUBCUTANEOUS
  Administered 2021-07-05: 11 [IU] via SUBCUTANEOUS
  Administered 2021-07-06 (×3): 20 [IU] via SUBCUTANEOUS
  Filled 2021-07-01 (×33): qty 1

## 2021-07-01 MED ORDER — INSULIN ASPART 100 UNIT/ML IJ SOLN: 0.0000 [IU] | INTRAMUSCULAR | Status: DC

## 2021-07-01 MED ORDER — INSULIN ASPART 100 UNIT/ML IJ SOLN
0.0000 [IU] | INTRAMUSCULAR | Status: DC
  Administered 2021-07-01: 5 [IU] via SUBCUTANEOUS
  Administered 2021-07-01: 3 [IU] via SUBCUTANEOUS
  Filled 2021-07-01 (×2): qty 1

## 2021-07-01 NOTE — Consult Note (Signed)
PHARMACY - TOTAL PARENTERAL NUTRITION CONSULT NOTE   Indication:  Prolonged NPO  Patient Measurements: Height: 5\' 2"  (157.5 cm) Weight: 102.9 kg (226 lb 13.7 oz) IBW/kg (Calculated) : 50.1 TPN AdjBW (KG): 62.5 Body mass index is 41.49 kg/m.  Assessment:  Patient is a [name] with medical history including HTN, diabetes, HLD, anxiety / depression, gout who was admitted 8/2 with acute renal failure, acute respiratory failure secondary to pneumonia. Hospital course complicated by perforated duodenal ulcer and patient was emergently taken to OR 8/5 for ExLap, repair of perforated duodenal ulcer with creation of omental flap. Post-operatively, patient admitted to the ICU where she remains intubated, sedated, and on mechanical ventilation. Pharmacy has been consulted to initiate TPN for prolonged NPO status.   Glucose / Insulin: Last A1c 8.3%.  24 hr glucose range 172 - 293 24 hr SSI requirements 26 units Electrolytes: hyperphosphatemia Renal: Scr 9.32 >> 6.2 >> 5.4. Nephrology following Hepatic: Within normal limits on admission Intake / Output; MIVF: Net negative 2 L for the admission GI Imaging: 8/4 CT Abdomen / pelvis: Extensive pneumoperitoneum compatible with perforated viscus. Large ill-defined mass arising from the upper pole left kidney consistent with renal cell carcinoma.  Acute left-sided hydronephrosis due to a 12 mm left UPJ calculus. Chronic right hydronephrosis and hydroureter without clear etiology. GI Surgeries / Procedures:  8/5: ExLap with repair of perforated duodenal ulcer with creation of omental flap  Central access: 8/5  TPN start date:  8/5  Nutritional Goals (per RD recommendation on 8/5): kCal: 1150 - 1450 kcal/day, Protein: 100 - 125 g/day, Fluid: 1.2 - 1.5 L/day  Goal TPN rate is 65 mL/hr (provides 101 g of protein and 1379 kcals per day)  Current Nutrition:  NPO  Plan:  --Increase TPN to goal rate 65 mL/hr at 1800 Amino acids: 101.4 g Dextrose: 171.6  g Lipids: 39 g Kcal: 1379 Fluid: 1660 mL (including overfill) --Electrolytes in TPN: Na 48mEq/L, K 27mEq/L, Ca 57mEq/L, Mg 12mEq/L, and Phos 32mmol/L. Cl:Ac 1:1 Remove phosphorus for now --Add standard MVI and trace elements to TPN Hold chromium for now given renal dysfunction Add thiamine / folic acid x 3 days (day 2 / 3) --SSI increased to resistant scale this morning. Will hold off on further adjustments. Will consider adding scheduled coverage or adding insulin to TPN depending on 24 hr requirements --Monitor TPN labs on Mon/Thurs, daily until stable  Lameeka Schleifer Burna Mortimer, PharmD, BCPS 07/01/2021,8:40 AM

## 2021-07-01 NOTE — Progress Notes (Signed)
Patient ID: Heather Lopez, female   DOB: 12/07/1945, 75 y.o.   MRN: [id number]     [place] Hospital Day(s): 4.   Interval History: Patient seen and examined.  No significant change in her clinical status.  Continue critically ill.  Vital signs in last 24 hours: [min-max] current  Temp:  [97.2 F (36.2 C)-99.68 F (37.6 C)] 97.5 F (36.4 C) (08/06 1130) Pulse Rate:  [80-110] 91 (08/06 1130) Resp:  [11-33] 18 (08/06 1130) BP: (92-168)/(55-106) 139/67 (08/06 1130) SpO2:  [91 %-98 %] 94 % (08/06 1130) Arterial Line BP: (88-162)/(37-57) 162/55 (08/06 0800) FiO2 (%):  [30 %-40 %] 30 % (08/06 1114)     Height: 5\' 2"  (157.5 cm) Weight: 102.9 kg BMI (Calculated): 41.48   Physical Exam:  Constitutional: Critically ill, sedated Respiratory: On mechanical ventilation Cardiovascular: regular rate and sinus rhythm  Gastrointestinal: soft, and non-distended.  Drain serosanguineous  Labs:  CBC Latest Ref Rng & Units 07/01/2021 06/30/2021 06/29/2021  WBC 4.0 - 10.5 K/uL 30.3(H) 30.3(H) 20.8(H)  Hemoglobin 12.0 - 15.0 g/dL 7.8(L) 10.0(L) 9.5(L)  Hematocrit 36.0 - 46.0 % 23.0(L) 28.8(L) 27.4(L)  Platelets 150 - 400 K/uL 231 288 380   CMP Latest Ref Rng & Units 07/01/2021 06/30/2021 06/29/2021  Glucose 70 - 99 mg/dL 320(H) 157(H) 240(H)  BUN 8 - 23 mg/dL 110(H) 141(H) 152(H)  Creatinine 0.44 - 1.00 mg/dL 5.40(H) 6.20(H) 6.68(H)  Sodium 135 - 145 mmol/L 135 130(L) 130(L)  Potassium 3.5 - 5.1 mmol/L 4.5 4.4 3.8  Chloride 98 - 111 mmol/L 101 100 95(L)  CO2 22 - 32 mmol/L 22 19(L) 18(L)  Calcium 8.9 - 10.3 mg/dL 8.6(L) 7.7(L) 8.1(L)  Total Protein 6.5 - 8.1 g/dL 5.7(L) - -  Total Bilirubin 0.3 - 1.2 mg/dL 1.1 - -  Alkaline Phos 38 - 126 U/L 61 - -  AST 15 - 41 U/L 43(H) - -  ALT 0 - 44 U/L 44 - -    Imaging studies: No new pertinent imaging studies   Assessment/Plan:  75 y.o. female with duodenal perforation 2 Days Post-Op s/p repair, complicated by pertinent comorbidities including acute  renal failure on hemodialysis, type 2 diabetes mellitus, hypertension.  Patient today continue critically ill but no clinical deterioration.  She continue on mechanical ventilation.  She tolerated dialysis.  Abdominal drain without sign of bile leak.  We recommend to continue straight n.p.o., continue TPN.  We will continue to follow closely.  Continue critical care management as per primary team.   Arnold Long, MD

## 2021-07-01 NOTE — Progress Notes (Signed)
HD ended at 1230, patient tolerated without incident. UF goal 0L achieved. Report given to primary RN

## 2021-07-01 NOTE — Consult Note (Addendum)
NAME:  Heather Lopez, MRN:  [id number], DOB:  May 23, 1946, LOS: 4 ADMISSION DATE:  06/27/2021, CONSULTATION DATE:  06/30/21 REFERRING MD:  Hampton Abbot, MD  CHIEF COMPLAINT:  Abdominal Pain   HPI  75 y.o with significant PMH as below who presented to the ED on 06/27/21 with chief complaints of progressive shortness of breath, productive cough, loss of appetite, nausea, dry heaving fatigue, malaise and generalized body aches. Patient initially went to her PCP on 7/29 with complaints of rhinorrhea, harsh cough, loss of appetite, nausea, fatigue, malaise and generalized body aches x2 days, patient was diagnosed with pneumonia of which she received ceftriaxone 1 mg IM and sent home with Levaquin, prednisone and Tessalon.  Per ED reports, patient reported progressive worsening of her symptoms during ED visit.   07/01/21- patient had SBT today for recruitment and chest physiotherapy.  Still has significant tachycardia and hypertension when awake (possible pain related) but mentation is good able to follow verbal communication.    Past Medical History  Diabetes mellitus type 2 Anxiety and depression Hypertension Hyperlipidemia Hydronephrosis and recurrent UTI Gout and osteoarthritis Significant Hospital Events   8/2: Admitted to MedSurg unit with sepsis 8/3: Vascular consulted for HD catheter 8/4: Patient developed acute abdomen.  CT abdomen shows viscus perforation.  Patient taken to the OR urgently for exploratory lap.  Patient return to ICU on vent.  PCCM consulted  Consults:  Nephrology Vascular PCCM Procedures:  8/5: Exploratory Lap   Significant Diagnostic Tests:  8/2: Chest Xray>left basilar consolidation or atelectasis and suspected small left pleural effusion 8/2: Renal ultrasound> moderate right hydronephrosis, mild left hydronephrosis new with bilateral nephrolithiasis 8/4: CTA abdomen and pelvis> Extensive pneumoperitoneum compatible with perforated viscus. Gastric perforation is suspected,  though bowel wall defect is not definitively identified. Surgical consultation is recommended. 2. Large ill-defined mass arising from the upper pole left kidney consistent with renal cell carcinoma. Dedicated renal MRI may be useful when clinical situation permits. 3. Acute left-sided hydronephrosis due to a 12 mm left UPJ calculus. 4. Chronic right hydronephrosis and hydroureter without clear etiology. 5. Bilateral nonobstructing renal calculi as above. 6. Small volume ascites. 7. Trace bilateral pleural effusions with patchy bibasilar atelectasis.  Micro Data:  8/2: SARS-CoV-2 PCR> negative 8/2: Influenza PCR> negative 8/2: Blood culture x2> 8/2: Urine Culture> 30,000 colonies yeast 8/2: Legionella urinary antigen> negative  Antimicrobials:  8/4 Fluconazole> 8/4 Zosyn>  OBJECTIVE  Blood pressure (!) 168/69, pulse 80, temperature 97.88 F (36.6 C), temperature source Bladder, resp. rate 16, height '5\' 2"'  (1.575 m), weight 102.9 kg, SpO2 95 %.    Vent Mode: PRVC FiO2 (%):  [30 %-65 %] 30 % Set Rate:  [14 bmp] 14 bmp Vt Set:  [500 mL] 500 mL PEEP:  [5 cmH20-8 cmH20] 5 cmH20   Intake/Output Summary (Last 24 hours) at 07/01/2021 0950 Last data filed at 07/01/2021 0800 Gross per 24 hour  Intake 878.36 ml  Output 653 ml  Net 225.36 ml    Filed Weights   06/27/21 0028 06/28/21 0100 06/30/21 0345  Weight: 99.8 kg 106.8 kg 102.9 kg   Physical Examination  GENERAL: 74 year-old critically ill patient lying in the bed intubated, mechanically ventilated and sedated EYES: Pupils equal, round, reactive to light and accommodation. No scleral icterus. Extraocular muscles intact.  HEENT: Head atraumatic, normocephalic. Oropharynx and nasopharynx clear.  NECK:  Supple, no jugular venous distention. No thyroid enlargement, no tenderness.  LUNGS: Normal breath sounds bilaterally, no wheezing, rales,rhonchi or crepitation. No use  of accessory muscles of respiration.  CARDIOVASCULAR: S1, S2  normal. No murmurs, rubs, or gallops.  ABDOMEN: Distended, Midline incision with honey comb dressing dry and intact. JP Drains x 2 EXTREMITIES: No pedal edema, cyanosis, or clubbing.  NEUROLOGIC: Cranial nerves II through XII are intact.  Muscle strength not tested patient is intubated. Sensation intact. Gait not checked.  PSYCHIATRIC: The patient is intubated and sedated SKIN: Rash under breast and abdominal folds. No OBVIOUS lesion  Labs/imaging that I havepersonally reviewed  (right click and "Reselect all SmartList Selections" daily)     Labs   CBC: Recent Labs  Lab 06/27/21 0032 06/27/21 0508 06/27/21 0713 06/28/21 0458 06/29/21 0601 06/30/21 0320 07/01/21 0508  WBC 24.4*   < > 19.2* 20.2* 20.8* 30.3* 30.3*  NEUTROABS 21.1*  --  17.9* 18.3*  --  27.9* 28.1*  HGB 10.4*   < > 8.9* 8.8* 9.5* 10.0* 7.8*  HCT 30.9*   < > 26.4* 25.7* 27.4* 28.8* 23.0*  MCV 83.7   < > 83.8 78.4* 78.3* 81.1 81.0  PLT 483*   < > 343 317 380 288 231   < > = values in this interval not displayed.     Basic Metabolic Panel: Recent Labs  Lab 06/27/21 0911 06/27/21 1459 06/27/21 2231 06/28/21 0458 06/29/21 0601 06/30/21 0250 06/30/21 0320 07/01/21 0508  NA  --  126* 129* 130* 130*  --  130* 135  K  --  5.9* 5.0 4.8 3.8  --  4.4 4.5  CL  --  95* 96* 96* 95*  --  100 101  CO2  --  11* 14* 16* 18*  --  19* 22  GLUCOSE  --  224* 261* 264* 240*  --  157* 320*  BUN  --  194* 170* 163* 152*  --  141* 110*  CREATININE  --  9.44* 9.14* 8.66* 6.68*  --  6.20* 5.40*  CALCIUM  --  7.8* 7.7* 7.6* 8.1*  --  7.7* 8.6*  MG 1.7  --   --   --   --   --  1.7 2.3  PHOS  --  9.5* 9.1*  --   --  8.2*  --  8.8*    GFR: Estimated Creatinine Clearance: 10.4 mL/min (A) (by C-G formula based on SCr of 5.4 mg/dL (H)). Recent Labs  Lab 06/27/21 0032 06/27/21 0239 06/27/21 0508 06/27/21 0713 06/28/21 0458 06/29/21 0601 06/30/21 0320 07/01/21 0508  PROCALCITON 6.83  --   --   --  5.48 3.18  --   --   WBC  24.4*  --  19.4* 19.2* 20.2* 20.8* 30.3* 30.3*  LATICACIDVEN 5.7* 6.3* 5.4* 4.8*  --   --  1.3  --      Liver Function Tests: Recent Labs  Lab 06/27/21 0713 06/27/21 1459 06/27/21 2231 06/28/21 0458 07/01/21 0508  AST 20  --   --  13* 43*  ALT 18  --   --  16 44  ALKPHOS 56  --   --  62 61  BILITOT 0.5  --   --  0.5 1.1  PROT 6.4*  --   --  6.1* 5.7*  ALBUMIN 2.6* 2.7* 2.6* 2.5* 1.9*    No results for input(s): LIPASE, AMYLASE in the last 168 hours. No results for input(s): AMMONIA in the last 168 hours.  ABG    Component Value Date/Time   PHART 7.32 (L) 06/30/2021 2200   PCO2ART 37 06/30/2021 2200  PO2ART 104 06/30/2021 2200   HCO3 19.1 (L) 06/30/2021 2200   ACIDBASEDEF 6.4 (H) 06/30/2021 2200   O2SAT 97.5 06/30/2021 2200      Coagulation Profile: Recent Labs  Lab 06/27/21 0032  INR 1.4*    Cardiac Enzymes: No results for input(s): CKTOTAL, CKMB, CKMBINDEX, TROPONINI in the last 168 hours.  CBG: Recent Labs  Lab 06/30/21 1615 06/30/21 1929 06/30/21 2322 07/01/21 0312 07/01/21 0737  GLUCAP 239* 172* 236* 272* 293*     Review of Systems:   UNABLE TO OBTAIN DUE TO PATIENT ON THE VENT AND SEDATED  Past Medical History  She,  has a past medical history of Anxiety, Cataract, Depression, Diabetes mellitus without complication ([place]), Gout, History of kidney stones, Hyperlipidemia, Hypertension, and Vertigo.   Surgical History    Past Surgical History:  Procedure Laterality Date   CATARACT EXTRACTION, BILATERAL Bilateral 2019   COLONOSCOPY WITH PROPOFOL N/A 08/19/2015   Procedure: COLONOSCOPY WITH PROPOFOL;  Surgeon: Lucilla Lame, MD;  Location: [place];  Service: Endoscopy;  Laterality: N/A;  diabetic - insulin   CYSTOSCOPY W/ RETROGRADES Bilateral 12/22/2019   Procedure: CYSTOSCOPY WITH RETROGRADE PYELOGRAM;  Surgeon: Abbie Sons, MD;  Location: [facility];  Service: Urology;  Laterality: Bilateral;   CYSTOSCOPY WITH BIOPSY N/A 12/22/2019    Procedure: CYSTOSCOPY WITH bladder BIOPSY;  Surgeon: Abbie Sons, MD;  Location: [facility];  Service: Urology;  Laterality: N/A;   CYSTOSCOPY WITH STENT PLACEMENT Right 12/22/2019   Procedure: CYSTOSCOPY WITH STENT PLACEMENT;  Surgeon: Abbie Sons, MD;  Location: [facility];  Service: Urology;  Laterality: Right;   CYSTOSCOPY WITH URETEROSCOPY Bilateral 12/22/2019   Procedure: CYSTOSCOPY WITH URETEROSCOPY;  Surgeon: Abbie Sons, MD;  Location: [facility];  Service: Urology;  Laterality: Bilateral;   CYSTOSCOPY/URETEROSCOPY/HOLMIUM LASER/STENT PLACEMENT Right 12/22/2019   Procedure: CYSTOSCOPY/URETEROSCOPY/HOLMIUM LASER/STENT PLACEMENT;  Surgeon: Abbie Sons, MD;  Location: [facility];  Service: Urology;  Laterality: Right;   DIALYSIS/PERMA CATHETER INSERTION N/A 06/29/2021   Procedure: DIALYSIS/PERMA CATHETER INSERTION;  Surgeon: Algernon Huxley, MD;  Location: [facility];  Service: Cardiovascular;  Laterality: N/A;   EYE SURGERY     LAPAROTOMY N/A 06/29/2021   Procedure: EXPLORATORY LAPAROTOMY WITH REPAIR OF DUODENAL PERFORATION;  Surgeon: [name], MD;  Location: [facility];  Service: General;  Laterality: N/A;   POLYPECTOMY  08/19/2015   Procedure: POLYPECTOMY;  Surgeon: Lucilla Lame, MD;  Location: [place];  Service: Endoscopy;;   TUBAL LIGATION  1992     Social History   reports that she quit smoking about 49 years ago. Her smoking use included cigarettes. She has a 4.50 pack-year smoking history. She has never used smokeless tobacco. She reports that she does not drink alcohol and does not use drugs.   Family History   Her family history includes Cancer in her father; Diabetes in her brother and father; Stroke in her mother.   Allergies Allergies  Allergen Reactions   Contrast Media  [Iodinated Diagnostic Agents] Anaphylaxis   Iodine Swelling    (IV only) - angioedema     Home Medications  Prior to Admission medications   Medication Sig Start Date  End Date Taking? Authorizing Provider  amLODipine (NORVASC) 10 MG tablet Take 1 tablet (10 mg total) by mouth daily. 04/15/18  Yes Mikey College, NP  aspirin EC 81 MG tablet Take 81 mg by mouth daily.   Yes [provider]  atorvastatin (LIPITOR) 20 MG tablet Take 1  tablet (20 mg total) by mouth daily. 04/15/18  Yes Mikey College, NP  benzonatate (TESSALON) 200 MG capsule Take 200 mg by mouth 3 (three) times daily as needed. 06/23/21  Yes [provider]  buPROPion (WELLBUTRIN XL) 150 MG 24 hr tablet Take 1 tablet (150 mg total) by mouth daily. 05/26/18  Yes Mikey College, NP  insulin aspart (NOVOLOG FLEXPEN) 100 UNIT/ML FlexPen Use meal coverage 6 units with every meal plus correction scale three times daily up to total of 35 units per day. 04/15/18  Yes Mikey College, NP  Insulin Pen Needle 32G X 4 MM MISC Inject insulin pens up to 5 times daily 04/15/18  Yes Mikey College, NP  Lancets (FREESTYLE) lancets USE AS INSTRUCTED 06/16/19  Yes Karamalegos, Alexander J, DO  LEVEMIR FLEXTOUCH 100 UNIT/ML Pen INJECT 50 UNITS IN THE MORNING AND 44 UNITS IN THE EVENING Patient taking differently: Inject 52 Units into the skin 2 (two) times daily. 06/30/18  Yes Mikey College, NP  levofloxacin (LEVAQUIN) 500 MG tablet Take 500 mg by mouth daily. 06/23/21  Yes [provider]  losartan (COZAAR) 50 MG tablet Take 1 tablet (50 mg total) by mouth daily. 04/15/18  Yes Mikey College, NP  metFORMIN (GLUCOPHAGE) 1000 MG tablet Take 1 tablet (1,000 mg total) by mouth 2 (two) times daily. 04/15/18  Yes Mikey College, NP  metoprolol tartrate (LOPRESSOR) 25 MG tablet Take 1 tablet (25 mg total) by mouth 2 (two) times daily. 05/26/18  Yes Mikey College, NP  Multiple Vitamin (MULTI-VITAMIN) tablet Take 1 tablet by mouth daily.    Yes [provider]  ondansetron (ZOFRAN-ODT) 4 MG disintegrating tablet Take 4 mg by mouth every 8  (eight) hours as needed. 06/23/21  Yes [provider]  predniSONE (DELTASONE) 20 MG tablet Take 20 mg by mouth 2 (two) times daily. 06/23/21  Yes [provider]  TRULICITY 0.23 XI/3.5WY SOPN SMARTSIG:0.5 Milliliter(s) SUB-Q Once a Week 06/19/21  Yes [provider]  Scheduled Meds:  chlorhexidine gluconate (MEDLINE KIT)  15 mL Mouth Rinse BID   Chlorhexidine Gluconate Cloth  6 each Topical Q0600   epoetin (EPOGEN/PROCRIT) injection  4,000 Units Intravenous Once   heparin  5,000 Units Subcutaneous Q8H   insulin aspart  0-20 Units Subcutaneous Q4H   mouth rinse  15 mL Mouth Rinse 10 times per day   pantoprazole (PROTONIX) IV  40 mg Intravenous Q12H   Continuous Infusions:  sodium chloride     sodium chloride 999 mL/hr at 06/30/21 0010   dexmedetomidine (PRECEDEX) IV infusion Stopped (06/30/21 1459)   fentaNYL infusion INTRAVENOUS 25 mcg/hr (07/01/21 0830)   fluconazole (DIFLUCAN) IV     norepinephrine (LEVOPHED) Adult infusion     piperacillin-tazobactam (ZOSYN)  IV Stopped (07/01/21 0649)   TPN ADULT (ION) 30 mL/hr at 07/01/21 0800   TPN ADULT (ION)     PRN Meds:.sodium chloride, sodium chloride, alteplase, fentaNYL, heparin, lidocaine (PF), lidocaine-prilocaine, ondansetron (ZOFRAN) IV, pentafluoroprop-tetrafluoroeth      Active Hospital Problem list   Acute hypoxic respiratory failure Severe sepsis with septic shock Bowel perforation AKI on CKD stage IV Right UPJ stone with hydronephrosis Anion gap metabolic acidosis Lactic acidosis Diabetes mellitus  Assessment & Plan:  Acute Hypoxic Respiratory Failure       Due to Right lung pneumonia bilateral pleural effusions with associated atelectasis -Continue ventilator support & lung protective strategies -Wean PEEP & FiO2 as tolerated, maintain SpO2 > 90% -Head of  bed elevated 30 degrees, VAP protocol in place -Plateau pressures less than 30 cm H20  -Intermittent chest x-ray & ABG PRN -Daily WUA  with SBT as tolerated  -Ensure adequate pulmonary hygiene  -Bronchodilators PRN -PAD protocol in place: continue Fentanyl  drip  & Propofol drip          Zosyn and Diflucan  Sepsis with Septic Shock secondary to UTI, pneumonia and now with perforated bowel Lactic: 5.7>6.3>5.4>4.8, Baseline PCT: 6.83, UA: + UTI 30,000 colonies/yeast, CXR: Left lower lobe pneumonia, CT: Chest perforation -Follow cultures, trend lactic/ PCT -Monitor WBC/ fever curve -IV antibiotics: Continue coverage with Zosyn &Fluconazole -IVF hydration as needed -Continue Levophed and Vasopressin vasopressors to maintain MAP> 65 -Strict I/O's: Goal UOP >0.5 mL/kg/hr -If Persistent hypotension will consider stress dose steroids   Bowel perforation CT abdomen pelvis shows with free air and fluid in her abdomen consistent with viscus perforation S/p ex lap POD # 0 -Keep NPO x 5 days -Hemodynamic support as above -NG tube to intermittent suction -IV broad-spectrum antibiotics as above -Wound care per general surgery -General surgery following input appreciated  Acute kidney injury on CKD stage IV Right UPJ stone with hydronephrosis AGMA Lactic acidosis Suspected renal cell carcinoma will need further eval/biopsy -Trend lactic acid -Daily BMP, replace electrolytes PRN -Avoid nephrotoxic agents as able,  -Ensure adequate renal perfusion -Nephrology following, appreciate input  -Replace electrolytes as indicated -CRRT may be indicated given critical illness  Diabetes mellitus -CBGs -Sliding scale insulin -Follow ICU hyper/hypoglycemia protocol -Hold home Meds    Best practice:  Diet:  NPO Pain/Anxiety/Delirium protocol (if indicated): Yes (RASS goal 0) VAP protocol (if indicated): Yes DVT prophylaxis: Subcutaneous Heparin GI prophylaxis: PPI Glucose control:  SSI Yes Central venous access:  Yes, and it is still needed Arterial line:  Yes, and it is still needed Foley:  Yes, and it is still  needed Mobility:  bed rest  PT consulted: N/A Last date of multidisciplinary goals of care discussion [8/5] Code Status:  full code Disposition: ICU   = Goals of Care = Code Status Order: FULL  Primary Emergency Contact: Melgarejo,Steven C, Home Phone: [phone] Wishes to pursue full aggressive treatment and intervention options, including CPR and intubation, goals of care will be addressed on going with family if that should become necessary.   Critical care provider statement:   Total critical care time: 33 minutes   Performed by: Lanney Gins MD   Critical care time was exclusive of separately billable procedures and treating other patients.   Critical care was necessary to treat or prevent imminent or life-threatening deterioration.   Critical care was time spent personally by me on the following activities: development of treatment plan with patient and/or surrogate as well as nursing, discussions with consultants, evaluation of patient's response to treatment, examination of patient, obtaining history from patient or surrogate, ordering and performing treatments and interventions, ordering and review of laboratory studies, ordering and review of radiographic studies, pulse oximetry and re-evaluation of patient's condition.    Ottie Glazier, M.D.  Pulmonary & Critical Care Medicine      .

## 2021-07-01 NOTE — Progress Notes (Signed)
[facility]  ROUNDING NOTE   Subjective:   Heather Lopez is a 75 y.o. white female who has been admitted for community acquired pneumonia. Patient with weakness for 3-4 days. She has been having shortness of breath. She was evaluated by her PCP, who diagnosed her with pneumonia and she was prescribed levofloxacin.   Patient remains critically ill Intubated, sedated About to get dialysis Fio2 30%  Objective:  Vital signs in last 24 hours:  Temp:  [97.34 F (36.3 C)-99.68 F (37.6 C)] 97.88 F (36.6 C) (08/06 0800) Pulse Rate:  [80-110] 80 (08/06 0800) Resp:  [11-33] 16 (08/06 0800) BP: (92-168)/(54-106) 168/69 (08/06 0800) SpO2:  [93 %-98 %] 95 % (08/06 0808) Arterial Line BP: (88-162)/(37-57) 162/55 (08/06 0800) FiO2 (%):  [30 %-65 %] 30 % (08/06 0808)  Weight change:  Filed Weights   06/27/21 0028 06/28/21 0100 06/30/21 0345  Weight: 99.8 kg 106.8 kg 102.9 kg    Intake/Output: I/O last 3 completed shifts: In: 2215.6 [I.V.:1724.1; IV Piggyback:491.5] Out: 1367 [QIHKV:4259; Emesis/NG output:100; Drains:260]   Intake/Output this shift:  Total I/O In: 40 [I.V.:40] Out: 5 [Urine:60]  Physical Exam: General: Critically ill  Head: ETT, OGT  Eyes: Anicteric  Lungs:  Vent assisted  Heart: Regular rate and rhythm  Abdomen:  +JP drains  Extremities:  trace peripheral edema.  Neurologic:  sedated  Skin: No lesions  Access:  LIJ permcath    Basic Metabolic Panel: Recent Labs  Lab 06/27/21 0911 06/27/21 1459 06/27/21 2231 06/28/21 0458 06/29/21 0601 06/30/21 0250 06/30/21 0320 07/01/21 0508  NA  --  126* 129* 130* 130*  --  130* 135  K  --  5.9* 5.0 4.8 3.8  --  4.4 4.5  CL  --  95* 96* 96* 95*  --  100 101  CO2  --  11* 14* 16* 18*  --  19* 22  GLUCOSE  --  224* 261* 264* 240*  --  157* 320*  BUN  --  194* 170* 163* 152*  --  141* 110*  CREATININE  --  9.44* 9.14* 8.66* 6.68*  --  6.20* 5.40*  CALCIUM  --  7.8* 7.7* 7.6* 8.1*  --  7.7* 8.6*  MG  1.7  --   --   --   --   --  1.7 2.3  PHOS  --  9.5* 9.1*  --   --  8.2*  --  8.8*     Liver Function Tests: Recent Labs  Lab 06/27/21 0713 06/27/21 1459 06/27/21 2231 06/28/21 0458 07/01/21 0508  AST 20  --   --  13* 43*  ALT 18  --   --  16 44  ALKPHOS 56  --   --  62 61  BILITOT 0.5  --   --  0.5 1.1  PROT 6.4*  --   --  6.1* 5.7*  ALBUMIN 2.6* 2.7* 2.6* 2.5* 1.9*    No results for input(s): LIPASE, AMYLASE in the last 168 hours. No results for input(s): AMMONIA in the last 168 hours.  CBC: Recent Labs  Lab 06/27/21 0032 06/27/21 0508 06/27/21 0713 06/28/21 0458 06/29/21 0601 06/30/21 0320 07/01/21 0508  WBC 24.4*   < > 19.2* 20.2* 20.8* 30.3* 30.3*  NEUTROABS 21.1*  --  17.9* 18.3*  --  27.9* 28.1*  HGB 10.4*   < > 8.9* 8.8* 9.5* 10.0* 7.8*  HCT 30.9*   < > 26.4* 25.7* 27.4* 28.8* 23.0*  MCV 83.7   < >  83.8 78.4* 78.3* 81.1 81.0  PLT 483*   < > 343 317 380 288 231   < > = values in this interval not displayed.     Cardiac Enzymes: No results for input(s): CKTOTAL, CKMB, CKMBINDEX, TROPONINI in the last 168 hours.  BNP: Invalid input(s): POCBNP  CBG: Recent Labs  Lab 06/30/21 1615 06/30/21 1929 06/30/21 2322 07/01/21 0312 07/01/21 0737  GLUCAP 239* 172* 236* 272* 293*     Microbiology: Results for orders placed or performed during the hospital encounter of 06/27/21  Resp Panel by RT-PCR (Flu A&B, Covid) Nasopharyngeal Swab     Status: None   Collection Time: 06/27/21 12:32 AM   Specimen: Nasopharyngeal Swab; Nasopharyngeal(NP) swabs in vial transport medium  Result Value Ref Range Status   SARS Coronavirus 2 by RT PCR NEGATIVE NEGATIVE Final    Comment: (NOTE) SARS-CoV-2 target nucleic acids are NOT DETECTED.  The SARS-CoV-2 RNA is generally detectable in upper respiratory specimens during the acute phase of infection. The lowest concentration of SARS-CoV-2 viral copies this assay can detect is 138 copies/mL. A negative result does not  preclude SARS-Cov-2 infection and should not be used as the sole basis for treatment or other patient management decisions. A negative result may occur with  improper specimen collection/handling, submission of specimen other than nasopharyngeal swab, presence of viral mutation(s) within the areas targeted by this assay, and inadequate number of viral copies(<138 copies/mL). A negative result must be combined with clinical observations, patient history, and epidemiological information. The expected result is Negative.  Fact Sheet for Patients:  EntrepreneurPulse.com.au  Fact Sheet for Healthcare Providers:  IncredibleEmployment.be  This test is no t yet approved or cleared by the [place] FDA and  has been authorized for detection and/or diagnosis of SARS-CoV-2 by FDA under an Emergency Use Authorization (EUA). This EUA will remain  in effect (meaning this test can be used) for the duration of the COVID-19 declaration under Section 564(b)(1) of the Act, 21 U.S.C.section 360bbb-3(b)(1), unless the authorization is terminated  or revoked sooner.       Influenza A by PCR NEGATIVE NEGATIVE Final   Influenza B by PCR NEGATIVE NEGATIVE Final    Comment: (NOTE) The Xpert Xpress SARS-CoV-2/FLU/RSV plus assay is intended as an aid in the diagnosis of influenza from Nasopharyngeal swab specimens and should not be used as a sole basis for treatment. Nasal washings and aspirates are unacceptable for Xpert Xpress SARS-CoV-2/FLU/RSV testing.  Fact Sheet for Patients: EntrepreneurPulse.com.au  Fact Sheet for Healthcare Providers: IncredibleEmployment.be  This test is not yet approved or cleared by the [place] FDA and has been authorized for detection and/or diagnosis of SARS-CoV-2 by FDA under an Emergency Use Authorization (EUA). This EUA will remain in effect (meaning this test can be used) for the  duration of the COVID-19 declaration under Section 564(b)(1) of the Act, 21 U.S.C. section 360bbb-3(b)(1), unless the authorization is terminated or revoked.  Performed at [facility], [address] [place], [place] [address]   Blood culture (routine x 2)     Status: None (Preliminary result)   Collection Time: 06/27/21 12:43 AM   Specimen: BLOOD  Result Value Ref Range Status   Specimen Description BLOOD RIGHT ASSIST CONTROL  Final   Special Requests   Final    BOTTLES DRAWN AEROBIC AND ANAEROBIC Blood Culture results may not be optimal due to an inadequate volume of blood received in culture bottles   Culture   Final  NO GROWTH 4 DAYS Performed at [facility], [address] [place], [place] [address]    Report Status PENDING  Incomplete  Blood culture (routine x 2)     Status: None (Preliminary result)   Collection Time: 06/27/21  1:45 AM   Specimen: BLOOD  Result Value Ref Range Status   Specimen Description BLOOD RIGHT ASSIST CONTROL  Final   Special Requests   Final    BOTTLES DRAWN AEROBIC AND ANAEROBIC Blood Culture adequate volume   Culture   Final    NO GROWTH 4 DAYS Performed at [facility], [address], [place], [place] [address]    Report Status PENDING  Incomplete  Urine Culture     Status: Abnormal   Collection Time: 06/27/21  2:39 AM   Specimen: In/Out Cath Urine  Result Value Ref Range Status   Specimen Description   Final    IN/OUT CATH URINE Performed at [facility], [address], [place], [place] [address]    Special Requests   Final    NONE Performed at [facility], [address] [place], [place] [address]    Culture 30,000 COLONIES/mL YEAST (A)  Final   Report Status 06/28/2021 FINAL  Final  MRSA Next Gen by PCR, Nasal     Status: None   Collection Time: 06/30/21  5:01 AM   Specimen: Nasal Mucosa; Nasal Swab  Result Value Ref Range Status   MRSA by PCR Next Gen NOT DETECTED NOT  DETECTED Final    Comment: (NOTE) The GeneXpert MRSA Assay (FDA approved for NASAL specimens only), is one component of a comprehensive MRSA colonization surveillance program. It is not intended to diagnose MRSA infection nor to guide or monitor treatment for MRSA infections. Test performance is not FDA approved in patients less than 63 years old. Performed at [facility], [address] [place], [place] [address]     Coagulation Studies: No results for input(s): LABPROT, INR in the last 72 hours.   Urinalysis: No results for input(s): COLORURINE, LABSPEC, PHURINE, GLUCOSEU, HGBUR, BILIRUBINUR, KETONESUR, PROTEINUR, UROBILINOGEN, NITRITE, LEUKOCYTESUR in the last 72 hours.  Invalid input(s): APPERANCEUR     Imaging: CT ABDOMEN PELVIS WO CONTRAST  Result Date: 06/29/2021 CLINICAL DATA:  Hypertension, diabetes, weakness for several days, acute abdominal pain, vomiting EXAM: CT ABDOMEN AND PELVIS WITHOUT CONTRAST TECHNIQUE: Multidetector CT imaging of the abdomen and pelvis was performed following the standard protocol without IV contrast. COMPARISON:  12/15/2019 FINDINGS: Lower chest: There are small bilateral pleural effusions, left greater than right. Scattered hypoventilatory changes are seen at the lung bases. Hepatobiliary: Unenhanced imaging of the liver and gallbladder demonstrates no focal abnormalities. No biliary duct dilation. Pancreas: Unremarkable. No pancreatic ductal dilatation or surrounding inflammatory changes. Spleen: Normal in size without focal abnormality. Adrenals/Urinary Tract: There is a heterogeneous mass with ill-defined margins arising from the upper pole left kidney, measuring 6.1 x 8.0 x 4.1 cm, compatible with renal cell carcinoma. Additionally, there is an obstructing 12 mm proximal left ureteral calculus at the UPJ, with moderate left-sided hydronephrosis. Other nonobstructing left renal calculi are seen, measuring up to 18 mm. There is chronic  right hydronephrosis and hydroureter without clear cause for obstruction. Nonobstructing calculi are seen within the lower pole of the right kidney measuring up to 13 mm in size. The bladder is decompressed with a Foley catheter. Adrenals are unremarkable. Stomach/Bowel: There is no bowel obstruction or ileus. Scattered diverticulosis of the sigmoid colon without evidence  of acute diverticulitis. There is a large amount of free gas throughout the peritoneal cavity. Findings are consistent with a ruptured viscus. Given the gas and fluid within the central upper abdomen, this could reflect gastric perforation. Surgical consultation is recommended. Vascular/Lymphatic: There is diffuse atherosclerosis. No pathologic adenopathy within the abdomen or pelvis. Reproductive: Uterus and bilateral adnexa are unremarkable. Other: Small amount of ascites throughout the lower abdomen and pelvis. Diffuse pneumoperitoneum as described above, compatible with perforated viscus. The exact site of perforation is not identified, though the stomach is suspected given the presence of fluid and gas along the gastrohepatic ligament. No abdominal wall hernia. Musculoskeletal: There are no acute bony abnormalities. There is severe right hip osteoarthritis with more mild degenerative change on the left. Reconstructed images demonstrate no additional findings. IMPRESSION: 1. Extensive pneumoperitoneum compatible with perforated viscus. Gastric perforation is suspected, though bowel wall defect is not definitively identified. Surgical consultation is recommended. 2. Large ill-defined mass arising from the upper pole left kidney consistent with renal cell carcinoma. Dedicated renal MRI may be useful when clinical situation permits. 3. Acute left-sided hydronephrosis due to a 12 mm left UPJ calculus. 4. Chronic right hydronephrosis and hydroureter without clear etiology. 5. Bilateral nonobstructing renal calculi as above. 6. Small volume ascites.  7. Trace bilateral pleural effusions with patchy bibasilar atelectasis. 8.  Aortic Atherosclerosis (ICD10-I70.0). Critical Value/emergent results were called by telephone at the time of interpretation on 06/29/2021 at 959 pm to provider [facility] , who verbally acknowledged these results. Electronically Signed   By: Randa Ngo M.D.   On: 06/29/2021 22:04   DG Abd 1 View  Result Date: 06/30/2021 CLINICAL DATA:  NG tube placement. EXAM: ABDOMEN - 1 VIEW COMPARISON:  Earlier film, same date. FINDINGS: The NG tube has been advanced. The tip is in the body/antral region of the stomach. Stable bowel gas pattern. IMPRESSION: NG tube in good position in the stomach. Electronically Signed   By: Marijo Sanes M.D.   On: 06/30/2021 15:41   DG Abd 1 View  Result Date: 06/30/2021 CLINICAL DATA:  OG tube placement EXAM: ABDOMEN - 1 VIEW COMPARISON:  09/20/2005. FINDINGS: OG tube tip is in the mid stomach. Proximal side port is in the region of the GE junction. Nonspecific bowel gas pattern with surgical drain overlying the right abdomen. IMPRESSION: OG tube tip is in the mid stomach with proximal side in the region of the GE junction. Tube could be advanced 4-5 cm to ensure positioning of the side port below the GE junction, as warranted. Electronically Signed   By: Misty Stanley M.D.   On: 06/30/2021 10:20   PERIPHERAL VASCULAR CATHETERIZATION  Result Date: 06/29/2021 See surgical note for result.  DG Chest Port 1 View  Result Date: 06/30/2021 CLINICAL DATA:  Evaluate central line placement and OG tube placement. EXAM: PORTABLE CHEST 1 VIEW COMPARISON:  06/30/2021. FINDINGS: ET tube tip is in satisfactory position above the carina. There is a left chest wall dialysis catheter with tips in the cavoatrial junction and right atrium. New right IJ catheter tip is at the cavoatrial junction. No pneumothorax identified. Stable cardiomediastinal contours. Small left pleural effusion. Subsegmental atelectasis noted in  the right base. Atelectasis and airspace opacities identified in the left base. Interstitial edema pattern appears improved. IMPRESSION: 1. New right IJ catheter tip is at the cavoatrial junction. No pneumothorax identified. 2. Persistent atelectasis and airspace disease in the left lung base. Subsegmental atelectasis noted in the left base. 3. Unchanged  left pleural effusion. Electronically Signed   By: Kerby Moors M.D.   On: 06/30/2021 15:43   DG Chest Port 1 View  Result Date: 06/30/2021 CLINICAL DATA:  Endotracheal tube present Z97.8 (ICD-10-CM) EXAM: PORTABLE CHEST 1 VIEW COMPARISON:  06/27/2021. FINDINGS: Endotracheal tube tip approximately 5.4 cm above the carina. Left dual lumen IJ central venous catheter with the tip projecting at the right atrium. Gastric tube courses below the diaphragm with the tip outside the field of view. Hazy left basilar opacity, likely in part related to layering small left pleural effusion on this semi erect radiograph. Superimposed bibasilar opacities, including somewhat nodular opacity in the right lower lung. No visible pleural effusions or pneumothorax. Mild enlargement of the cardiomediastinal silhouette, which is accentuated by AP technique. IMPRESSION: 1. Low lung volumes with suspected small layering left pleural effusion and bibasilar opacities that may represent atelectasis, aspiration, and/or pneumonia. 2. Mild cardiomegaly and mild interstitial prominence, suggestive of mild interstitial edema. 3. Rounded, nodular opacity in the right lower lung, likely related to the above findings but warranting short interval follow-up radiographs (preferably PA and lateral) to ensure resolution and exclude pulmonary nodule. Electronically Signed   By: Margaretha Sheffield MD   On: 06/30/2021 10:24     Medications:    sodium chloride     sodium chloride 999 mL/hr at 06/30/21 0010   dexmedetomidine (PRECEDEX) IV infusion Stopped (06/30/21 1459)   fentaNYL infusion  INTRAVENOUS 25 mcg/hr (07/01/21 0830)   fluconazole (DIFLUCAN) IV     norepinephrine (LEVOPHED) Adult infusion     piperacillin-tazobactam (ZOSYN)  IV Stopped (07/01/21 0649)   TPN ADULT (ION) 30 mL/hr at 07/01/21 0800   TPN ADULT (ION)      chlorhexidine gluconate (MEDLINE KIT)  15 mL Mouth Rinse BID   Chlorhexidine Gluconate Cloth  6 each Topical Q0600   heparin  5,000 Units Subcutaneous Q8H   insulin aspart  0-20 Units Subcutaneous Q4H   mouth rinse  15 mL Mouth Rinse 10 times per day   pantoprazole (PROTONIX) IV  40 mg Intravenous Q12H     Assessment/ Plan:  Heather Lopez is a 75 y.o.  female who has been admitted for community acquired pneumonia. Patient with weakness for 3-4 days. She has been having shortness of breath. She was evaluated by her PCP, who diagnosed her with pneumonia and she was prescribed levofloxacin.  With perforated   Acute Kidney Injury on chronic kidney disease stage 4 with baseline creatinine 1.97 and GFR of 25 on 12/22/19.  Acute kidney injury secondary to sepsis and obstructive uropathy Chronic kidney disease is secondary to diabetic nephropathy Continue holding losartan and metformin  - continue HD based on daily assessments  - UOP 524 cc  Lab Results  Component Value Date   CREATININE 5.40 (H) 07/01/2021   CREATININE 6.20 (H) 06/30/2021   CREATININE 6.68 (H) 06/29/2021    Intake/Output Summary (Last 24 hours) at 07/01/2021 0933 Last data filed at 07/01/2021 0800 Gross per 24 hour  Intake 878.36 ml  Output 653 ml  Net 225.36 ml    2. Acute Respiratory failure secondary to community acquired pneumonia failing outpatient antibiotics. Requiring intubation and mechanical ventilation.   3. Anemia of chronic kidney disease Normocytic Lab Results  Component Value Date   HGB 7.8 (L) 07/01/2021   Low threshold to initiate ESA Dose to be given 07/01/21  4.  Diabetes mellitus type II with chronic kidney disease insulin dependent. Most recent  hemoglobin A1c is  8.3 on 06/12/21.  Metformin held    5. Nephrolithiasis- with obstructive uropathy and symptoms consistent with UTI. Urine culture positive for 30,000 colonies of yeast. Continue antibiotics Followed by Urology outpatient with urethral stent placement in past     LOS: War 8/6/20229:33 AM

## 2021-07-02 ENCOUNTER — Inpatient Hospital Stay: Payer: Medicare Other

## 2021-07-02 LAB — CULTURE, BLOOD (ROUTINE X 2)
Culture: NO GROWTH
Culture: NO GROWTH
Special Requests: ADEQUATE

## 2021-07-02 LAB — GLUCOSE, CAPILLARY
Glucose-Capillary: 163 mg/dL — ABNORMAL HIGH (ref 70–99)
Glucose-Capillary: 169 mg/dL — ABNORMAL HIGH (ref 70–99)
Glucose-Capillary: 169 mg/dL — ABNORMAL HIGH (ref 70–99)
Glucose-Capillary: 282 mg/dL — ABNORMAL HIGH (ref 70–99)
Glucose-Capillary: 332 mg/dL — ABNORMAL HIGH (ref 70–99)
Glucose-Capillary: 371 mg/dL — ABNORMAL HIGH (ref 70–99)
Glucose-Capillary: 372 mg/dL — ABNORMAL HIGH (ref 70–99)

## 2021-07-02 LAB — CBC
HCT: 22.1 % — ABNORMAL LOW (ref 36.0–46.0)
Hemoglobin: 7.5 g/dL — ABNORMAL LOW (ref 12.0–15.0)
MCH: 28.2 pg (ref 26.0–34.0)
MCHC: 33.9 g/dL (ref 30.0–36.0)
MCV: 83.1 fL (ref 80.0–100.0)
Platelets: 190 10*3/uL (ref 150–400)
RBC: 2.66 MIL/uL — ABNORMAL LOW (ref 3.87–5.11)
RDW: 15.1 % (ref 11.5–15.5)
WBC: 23.2 10*3/uL — ABNORMAL HIGH (ref 4.0–10.5)
nRBC: 0.1 % (ref 0.0–0.2)

## 2021-07-02 LAB — HEMOGLOBIN AND HEMATOCRIT, BLOOD
HCT: 23.2 % — ABNORMAL LOW (ref 36.0–46.0)
Hemoglobin: 7.5 g/dL — ABNORMAL LOW (ref 12.0–15.0)

## 2021-07-02 LAB — BASIC METABOLIC PANEL
Anion gap: 4 — ABNORMAL LOW (ref 5–15)
BUN: 91 mg/dL — ABNORMAL HIGH (ref 8–23)
CO2: 28 mmol/L (ref 22–32)
Calcium: 8.3 mg/dL — ABNORMAL LOW (ref 8.9–10.3)
Chloride: 102 mmol/L (ref 98–111)
Creatinine, Ser: 4.01 mg/dL — ABNORMAL HIGH (ref 0.44–1.00)
GFR, Estimated: 11 mL/min — ABNORMAL LOW (ref >60)
Glucose, Bld: 360 mg/dL — ABNORMAL HIGH (ref 70–99)
Potassium: 4.1 mmol/L (ref 3.5–5.1)
Sodium: 134 mmol/L — ABNORMAL LOW (ref 135–145)

## 2021-07-02 LAB — MAGNESIUM: Magnesium: 2.2 mg/dL (ref 1.7–2.4)

## 2021-07-02 LAB — PHOSPHORUS: Phosphorus: 5.1 mg/dL — ABNORMAL HIGH (ref 2.5–4.6)

## 2021-07-02 LAB — FIBRINOGEN: Fibrinogen: >800 mg/dL — ABNORMAL HIGH (ref 210–475)

## 2021-07-02 MED ORDER — INSULIN ASPART 100 UNIT/ML IJ SOLN
10.0000 [IU] | Freq: Once | INTRAMUSCULAR | Status: AC
  Administered 2021-07-02: 10 [IU] via SUBCUTANEOUS

## 2021-07-02 MED ORDER — INSULIN ASPART 100 UNIT/ML IJ SOLN
5.0000 [IU] | INTRAMUSCULAR | Status: AC
  Administered 2021-07-02 – 2021-07-03 (×9): 5 [IU] via SUBCUTANEOUS
  Filled 2021-07-02 (×8): qty 1

## 2021-07-02 MED ORDER — ACETAMINOPHEN 650 MG RE SUPP
650.0000 mg | Freq: Four times a day (QID) | RECTAL | Status: DC | PRN
  Administered 2021-07-02 – 2021-07-04 (×4): 650 mg via RECTAL
  Filled 2021-07-02 (×4): qty 1

## 2021-07-02 MED ORDER — INSULIN DETEMIR 100 UNIT/ML ~~LOC~~ SOLN
15.0000 [IU] | Freq: Two times a day (BID) | SUBCUTANEOUS | Status: DC
  Filled 2021-07-02: qty 0.15

## 2021-07-02 MED ORDER — INSULIN ASPART 100 UNIT/ML IJ SOLN
5.0000 [IU] | Freq: Once | INTRAMUSCULAR | Status: AC
  Administered 2021-07-02: 5 [IU] via SUBCUTANEOUS

## 2021-07-02 MED ORDER — M.V.I. ADULT IV INJ
INJECTION | INTRAVENOUS | Status: AC
Start: 2021-07-02 — End: 2021-07-03
  Filled 2021-07-02: qty 1014

## 2021-07-02 MED ORDER — HEPARIN (PORCINE) 25000 UT/250ML-% IV SOLN
1200.0000 [IU]/h | INTRAVENOUS | Status: DC
  Administered 2021-07-02: 1200 [IU]/h via INTRAVENOUS
  Filled 2021-07-02: qty 250

## 2021-07-02 MED ORDER — INSULIN ASPART 100 UNIT/ML IJ SOLN
15.0000 [IU] | Freq: Once | INTRAMUSCULAR | Status: AC
  Administered 2021-07-02: 15 [IU] via SUBCUTANEOUS
  Filled 2021-07-02: qty 1

## 2021-07-02 MED ORDER — INSULIN DETEMIR 100 UNIT/ML ~~LOC~~ SOLN
20.0000 [IU] | Freq: Two times a day (BID) | SUBCUTANEOUS | Status: DC
  Administered 2021-07-02 – 2021-07-06 (×9): 20 [IU] via SUBCUTANEOUS
  Filled 2021-07-02 (×10): qty 0.2

## 2021-07-02 NOTE — Progress Notes (Signed)
NAME:  TYRIHANNA WINGERT, MRN:  [id number], DOB:  [birth year], LOS: 5 ADMISSION DATE:  06/27/2021, CONSULTATION DATE:  06/30/21 REFERRING MD:  Hampton Abbot, MD  CHIEF COMPLAINT:  Abdominal Pain   HPI  75 y.o with significant PMH as below who presented to the ED on 06/27/21 with chief complaints of progressive shortness of breath, productive cough, loss of appetite, nausea, dry heaving fatigue, malaise and generalized body aches. Patient initially went to her PCP on 7/29 with complaints of rhinorrhea, harsh cough, loss of appetite, nausea, fatigue, malaise and generalized body aches x2 days, patient was diagnosed with pneumonia of which she received ceftriaxone 1 mg IM and sent home with Levaquin, prednisone and Tessalon.  Per ED reports, patient reported progressive worsening of her symptoms during ED visit.   07/01/21- patient had SBT today for recruitment and chest physiotherapy.  Still has significant tachycardia and hypertension when awake (possible pain related) but mentation is good able to follow verbal communication.   07/02/21-patient has failed SBT today due to hypoxemia.  Reviewed care plan with surgery team.    Past Medical History  Diabetes mellitus type 2 Anxiety and depression Hypertension Hyperlipidemia Hydronephrosis and recurrent UTI Gout and osteoarthritis Significant Hospital Events   8/2: Admitted to [facility] unit with sepsis 8/3: Vascular consulted for HD catheter 8/4: Patient developed acute abdomen.  CT abdomen shows viscus perforation.  Patient taken to the OR urgently for exploratory lap.  Patient return to ICU on vent.  PCCM consulted  Consults:  Nephrology Vascular PCCM Procedures:  8/5: Exploratory Lap   Significant Diagnostic Tests:  8/2: Chest Xray>left basilar consolidation or atelectasis and suspected small left pleural effusion 8/2: Renal ultrasound> moderate right hydronephrosis, mild left hydronephrosis new with bilateral nephrolithiasis 8/4: CTA abdomen and pelvis>  Extensive pneumoperitoneum compatible with perforated viscus. Gastric perforation is suspected, though bowel wall defect is not definitively identified. Surgical consultation is recommended. 2. Large ill-defined mass arising from the upper pole left kidney consistent with renal cell carcinoma. Dedicated renal MRI may be useful when clinical situation permits. 3. Acute left-sided hydronephrosis due to a 12 mm left UPJ calculus. 4. Chronic right hydronephrosis and hydroureter without clear etiology. 5. Bilateral nonobstructing renal calculi as above. 6. Small volume ascites. 7. Trace bilateral pleural effusions with patchy bibasilar atelectasis.  Micro Data:  8/2: SARS-CoV-2 PCR> negative 8/2: Influenza PCR> negative 8/2: Blood culture x2> 8/2: Urine Culture> 30,000 colonies yeast 8/2: Legionella urinary antigen> negative  Antimicrobials:  8/4 Fluconazole> 8/4 Zosyn>  OBJECTIVE  Blood pressure (!) 151/70, pulse 85, temperature (!) 100.58 F (38.1 C), resp. rate 14, height '5\' 2"'  (1.575 m), weight 102.9 kg, SpO2 (!) 89 %.    Vent Mode: PRVC FiO2 (%):  [30 %-35 %] 35 % Set Rate:  [14 bmp] 14 bmp Vt Set:  [500 mL] 500 mL PEEP:  [5 cmH20] 5 cmH20 Plateau Pressure:  [16 cmH20-17 cmH20] 17 cmH20   Intake/Output Summary (Last 24 hours) at 07/02/2021 1451 Last data filed at 07/02/2021 1301 Gross per 24 hour  Intake 1884.99 ml  Output 1910 ml  Net -25.01 ml    Filed Weights   06/27/21 0028 06/28/21 0100 06/30/21 0345  Weight: 99.8 kg 106.8 kg 102.9 kg   Physical Examination  GENERAL: 75 year-old critically ill patient lying in the bed intubated, mechanically ventilated and sedated EYES: Pupils equal, round, reactive to light and accommodation. No scleral icterus. Extraocular muscles intact.  HEENT: Head atraumatic, normocephalic. Oropharynx and nasopharynx clear.  NECK:  Supple, no jugular venous distention. No thyroid enlargement, no tenderness.  LUNGS: Normal breath sounds  bilaterally, no wheezing, rales,rhonchi or crepitation. No use of accessory muscles of respiration.  CARDIOVASCULAR: S1, S2 normal. No murmurs, rubs, or gallops.  ABDOMEN: Distended, Midline incision with honey comb dressing dry and intact. JP Drains x 2 EXTREMITIES: No pedal edema, cyanosis, or clubbing.  NEUROLOGIC: Cranial nerves II through XII are intact.  Muscle strength not tested patient is intubated. Sensation intact. Gait not checked.  PSYCHIATRIC: The patient is intubated and sedated SKIN: Rash under breast and abdominal folds. No OBVIOUS lesion  Labs/imaging that I havepersonally reviewed  (right click and "Reselect all SmartList Selections" daily)     Labs   CBC: Recent Labs  Lab 06/27/21 0032 06/27/21 0508 06/27/21 0713 06/28/21 0458 06/29/21 0601 06/30/21 0320 07/01/21 0508 07/02/21 0508  WBC 24.4*   < > 19.2* 20.2* 20.8* 30.3* 30.3* 23.2*  NEUTROABS 21.1*  --  17.9* 18.3*  --  27.9* 28.1*  --   HGB 10.4*   < > 8.9* 8.8* 9.5* 10.0* 7.8* 7.5*  HCT 30.9*   < > 26.4* 25.7* 27.4* 28.8* 23.0* 22.1*  MCV 83.7   < > 83.8 78.4* 78.3* 81.1 81.0 83.1  PLT 483*   < > 343 317 380 288 231 190   < > = values in this interval not displayed.     Basic Metabolic Panel: Recent Labs  Lab 06/27/21 0911 06/27/21 1459 06/27/21 2231 06/28/21 0458 06/29/21 0601 06/30/21 0250 06/30/21 0320 07/01/21 0508 07/02/21 0508  NA  --  126* 129* 130* 130*  --  130* 135 134*  K  --  5.9* 5.0 4.8 3.8  --  4.4 4.5 4.1  CL  --  95* 96* 96* 95*  --  100 101 102  CO2  --  11* 14* 16* 18*  --  19* 22 28  GLUCOSE  --  224* 261* 264* 240*  --  157* 320* 360*  BUN  --  194* 170* 163* 152*  --  141* 110* 91*  CREATININE  --  9.44* 9.14* 8.66* 6.68*  --  6.20* 5.40* 4.01*  CALCIUM  --  7.8* 7.7* 7.6* 8.1*  --  7.7* 8.6* 8.3*  MG 1.7  --   --   --   --   --  1.7 2.3 2.2  PHOS  --  9.5* 9.1*  --   --  8.2*  --  8.8* 5.1*    GFR: Estimated Creatinine Clearance: 13.8 mL/min (A) (by C-G formula  based on SCr of 4.01 mg/dL (H)). Recent Labs  Lab 06/27/21 0032 06/27/21 0239 06/27/21 0508 06/27/21 0713 06/28/21 0458 06/29/21 0601 06/30/21 0320 07/01/21 0508 07/02/21 0508  PROCALCITON 6.83  --   --   --  5.48 3.18  --   --   --   WBC 24.4*  --  19.4* 19.2* 20.2* 20.8* 30.3* 30.3* 23.2*  LATICACIDVEN 5.7* 6.3* 5.4* 4.8*  --   --  1.3  --   --      Liver Function Tests: Recent Labs  Lab 06/27/21 0713 06/27/21 1459 06/27/21 2231 06/28/21 0458 07/01/21 0508  AST 20  --   --  13* 43*  ALT 18  --   --  16 44  ALKPHOS 56  --   --  62 61  BILITOT 0.5  --   --  0.5 1.1  PROT 6.4*  --   --  6.1* 5.7*  ALBUMIN 2.6* 2.7* 2.6* 2.5* 1.9*    No results for input(s): LIPASE, AMYLASE in the last 168 hours. No results for input(s): AMMONIA in the last 168 hours.  ABG    Component Value Date/Time   PHART 7.32 (L) 06/30/2021 2200   PCO2ART 37 06/30/2021 2200   PO2ART 104 06/30/2021 2200   HCO3 19.1 (L) 06/30/2021 2200   ACIDBASEDEF 6.4 (H) 06/30/2021 2200   O2SAT 97.5 06/30/2021 2200      Coagulation Profile: Recent Labs  Lab 06/27/21 0032  INR 1.4*    Cardiac Enzymes: No results for input(s): CKTOTAL, CKMB, CKMBINDEX, TROPONINI in the last 168 hours.  CBG: Recent Labs  Lab 07/02/21 0022 07/02/21 0345 07/02/21 0625 07/02/21 0748 07/02/21 1146  GLUCAP 372* 371* 332* 282* 169*     Review of Systems:   UNABLE TO OBTAIN DUE TO PATIENT ON THE VENT AND SEDATED  Past Medical History  She,  has a past medical history of Anxiety, Cataract, Depression, Diabetes mellitus without complication ([place]), Gout, History of kidney stones, Hyperlipidemia, Hypertension, and Vertigo.   Surgical History    Past Surgical History:  Procedure Laterality Date   CATARACT EXTRACTION, BILATERAL Bilateral 2019   COLONOSCOPY WITH PROPOFOL N/A 08/19/2015   Procedure: COLONOSCOPY WITH PROPOFOL;  Surgeon: Lucilla Lame, MD;  Location: [facility];  Service: Endoscopy;   Laterality: N/A;  diabetic - insulin   CYSTOSCOPY W/ RETROGRADES Bilateral 12/22/2019   Procedure: CYSTOSCOPY WITH RETROGRADE PYELOGRAM;  Surgeon: Abbie Sons, MD;  Location: [facility];  Service: Urology;  Laterality: Bilateral;   CYSTOSCOPY WITH BIOPSY N/A 12/22/2019   Procedure: CYSTOSCOPY WITH bladder BIOPSY;  Surgeon: Abbie Sons, MD;  Location: [facility];  Service: Urology;  Laterality: N/A;   CYSTOSCOPY WITH STENT PLACEMENT Right 12/22/2019   Procedure: CYSTOSCOPY WITH STENT PLACEMENT;  Surgeon: Abbie Sons, MD;  Location: [facility];  Service: Urology;  Laterality: Right;   CYSTOSCOPY WITH URETEROSCOPY Bilateral 12/22/2019   Procedure: CYSTOSCOPY WITH URETEROSCOPY;  Surgeon: Abbie Sons, MD;  Location: [facility];  Service: Urology;  Laterality: Bilateral;   CYSTOSCOPY/URETEROSCOPY/HOLMIUM LASER/STENT PLACEMENT Right 12/22/2019   Procedure: CYSTOSCOPY/URETEROSCOPY/HOLMIUM LASER/STENT PLACEMENT;  Surgeon: Abbie Sons, MD;  Location: [facility];  Service: Urology;  Laterality: Right;   DIALYSIS/PERMA CATHETER INSERTION N/A 06/29/2021   Procedure: DIALYSIS/PERMA CATHETER INSERTION;  Surgeon: Algernon Huxley, MD;  Location: [facility];  Service: Cardiovascular;  Laterality: N/A;   EYE SURGERY     LAPAROTOMY N/A 06/29/2021   Procedure: EXPLORATORY LAPAROTOMY WITH REPAIR OF DUODENAL PERFORATION;  Surgeon: [name], MD;  Location: [facility];  Service: General;  Laterality: N/A;   POLYPECTOMY  08/19/2015   Procedure: POLYPECTOMY;  Surgeon: Lucilla Lame, MD;  Location: [facility];  Service: Endoscopy;;   TUBAL LIGATION  1992     Social History   reports that she quit smoking about 49 years ago. Her smoking use included cigarettes. She has a 4.50 pack-year smoking history. She has never used smokeless tobacco. She reports that she does not drink alcohol and does not use drugs.   Family History   Her family history includes Cancer in her father; Diabetes in her brother  and father; Stroke in her mother.   Allergies Allergies  Allergen Reactions   Contrast Media  [Iodinated Diagnostic Agents] Anaphylaxis   Iodine Swelling    (IV only) - angioedema     Home Medications  Prior to Admission medications   Medication Sig Start Date  End Date Taking? Authorizing Provider  amLODipine (NORVASC) 10 MG tablet Take 1 tablet (10 mg total) by mouth daily. 04/15/18  Yes Mikey College, NP  aspirin EC 81 MG tablet Take 81 mg by mouth daily.   Yes [provider]  atorvastatin (LIPITOR) 20 MG tablet Take 1 tablet (20 mg total) by mouth daily. 04/15/18  Yes Mikey College, NP  benzonatate (TESSALON) 200 MG capsule Take 200 mg by mouth 3 (three) times daily as needed. 06/23/21  Yes [provider]  buPROPion (WELLBUTRIN XL) 150 MG 24 hr tablet Take 1 tablet (150 mg total) by mouth daily. 05/26/18  Yes Mikey College, NP  insulin aspart (NOVOLOG FLEXPEN) 100 UNIT/ML FlexPen Use meal coverage 6 units with every meal plus correction scale three times daily up to total of 35 units per day. 04/15/18  Yes Mikey College, NP  Insulin Pen Needle 32G X 4 MM MISC Inject insulin pens up to 5 times daily 04/15/18  Yes Mikey College, NP  Lancets (FREESTYLE) lancets USE AS INSTRUCTED 06/16/19  Yes Karamalegos, Alexander J, DO  LEVEMIR FLEXTOUCH 100 UNIT/ML Pen INJECT 50 UNITS IN THE MORNING AND 44 UNITS IN THE EVENING Patient taking differently: Inject 52 Units into the skin 2 (two) times daily. 06/30/18  Yes Mikey College, NP  levofloxacin (LEVAQUIN) 500 MG tablet Take 500 mg by mouth daily. 06/23/21  Yes [provider]  losartan (COZAAR) 50 MG tablet Take 1 tablet (50 mg total) by mouth daily. 04/15/18  Yes Mikey College, NP  metFORMIN (GLUCOPHAGE) 1000 MG tablet Take 1 tablet (1,000 mg total) by mouth 2 (two) times daily. 04/15/18  Yes Mikey College, NP  metoprolol tartrate (LOPRESSOR) 25 MG tablet Take 1 tablet  (25 mg total) by mouth 2 (two) times daily. 05/26/18  Yes Mikey College, NP  Multiple Vitamin (MULTI-VITAMIN) tablet Take 1 tablet by mouth daily.    Yes [provider]  ondansetron (ZOFRAN-ODT) 4 MG disintegrating tablet Take 4 mg by mouth every 8 (eight) hours as needed. 06/23/21  Yes [provider]  predniSONE (DELTASONE) 20 MG tablet Take 20 mg by mouth 2 (two) times daily. 06/23/21  Yes [provider]  TRULICITY 0.71 QR/9.7JO SOPN SMARTSIG:0.5 Milliliter(s) SUB-Q Once a Week 06/19/21  Yes [provider]  Scheduled Meds:  chlorhexidine gluconate (MEDLINE KIT)  15 mL Mouth Rinse BID   Chlorhexidine Gluconate Cloth  6 each Topical Q0600   heparin  5,000 Units Subcutaneous Q8H   insulin aspart  0-20 Units Subcutaneous Q4H   insulin aspart  5 Units Subcutaneous Q4H   insulin detemir  20 Units Subcutaneous BID   mouth rinse  15 mL Mouth Rinse 10 times per day   pantoprazole (PROTONIX) IV  40 mg Intravenous Q12H   Continuous Infusions:  sodium chloride     dexmedetomidine (PRECEDEX) IV infusion Stopped (07/02/21 1301)   fentaNYL infusion INTRAVENOUS Stopped (07/02/21 1300)   fluconazole (DIFLUCAN) IV Stopped (07/01/21 1837)   norepinephrine (LEVOPHED) Adult infusion     piperacillin-tazobactam (ZOSYN)  IV 2.25 g (07/02/21 1421)   TPN ADULT (ION) 65 mL/hr at 07/02/21 1100   TPN ADULT (ION)     PRN Meds:.sodium chloride, alteplase, fentaNYL, heparin, lidocaine (PF), lidocaine-prilocaine, ondansetron (ZOFRAN) IV, pentafluoroprop-tetrafluoroeth      Active Hospital Problem list   Acute hypoxic respiratory failure Severe sepsis with septic shock Bowel perforation AKI on CKD stage IV Right UPJ stone with hydronephrosis Anion gap  metabolic acidosis Lactic acidosis Diabetes mellitus  Assessment & Plan:  Acute Hypoxic Respiratory Failure       Due to Right lung pneumonia bilateral pleural effusions with associated atelectasis -Continue  ventilator support & lung protective strategies -Wean PEEP & FiO2 as tolerated, maintain SpO2 > 90% -Head of bed elevated 30 degrees, VAP protocol in place -Plateau pressures less than 30 cm H20  -Intermittent chest x-ray & ABG PRN -Daily WUA with SBT as tolerated  -Ensure adequate pulmonary hygiene  -Bronchodilators PRN -PAD protocol in place: continue Fentanyl  drip  & Propofol drip          Zosyn and Diflucan  Sepsis with Septic Shock secondary to UTI, pneumonia and now with perforated bowel Lactic: 5.7>6.3>5.4>4.8, Baseline PCT: 6.83, UA: + UTI 30,000 colonies/yeast, CXR: Left lower lobe pneumonia, CT: Chest perforation -Follow cultures, trend lactic/ PCT -Monitor WBC/ fever curve -IV antibiotics: Continue coverage with Zosyn &Fluconazole -IVF hydration as needed -Continue Levophed and Vasopressin vasopressors to maintain MAP> 65 -Strict I/O's: Goal UOP >0.5 mL/kg/hr -If Persistent hypotension will consider stress dose steroids   Bowel perforation CT abdomen pelvis shows with free air and fluid in her abdomen consistent with viscus perforation S/p ex lap POD # 0 -Keep NPO x 5 days -Hemodynamic support as above -NG tube to intermittent suction -IV broad-spectrum antibiotics as above -Wound care per general surgery -General surgery following input appreciated  Acute kidney injury on CKD stage IV Right UPJ stone with hydronephrosis AGMA Lactic acidosis Suspected renal cell carcinoma will need further eval/biopsy -Trend lactic acid -Daily BMP, replace electrolytes PRN -Avoid nephrotoxic agents as able,  -Ensure adequate renal perfusion -Nephrology following, appreciate input  -Replace electrolytes as indicated -CRRT may be indicated given critical illness  Diabetes mellitus -CBGs -Sliding scale insulin -Follow ICU hyper/hypoglycemia protocol -Hold home Meds    Best practice:  Diet:  NPO Pain/Anxiety/Delirium protocol (if indicated): Yes (RASS goal 0) VAP protocol  (if indicated): Yes DVT prophylaxis: Subcutaneous Heparin GI prophylaxis: PPI Glucose control:  SSI Yes Central venous access:  Yes, and it is still needed Arterial line:  Yes, and it is still needed Foley:  Yes, and it is still needed Mobility:  bed rest  PT consulted: N/A Last date of multidisciplinary goals of care discussion [8/5] Code Status:  full code Disposition: ICU   = Goals of Care = Code Status Order: FULL  Primary Emergency Contact: Schlotterbeck,Steven C, Home Phone: [phone] Wishes to pursue full aggressive treatment and intervention options, including CPR and intubation, goals of care will be addressed on going with family if that should become necessary.   Critical care provider statement:   Total critical care time: 33 minutes   Performed by: Lanney Gins MD   Critical care time was exclusive of separately billable procedures and treating other patients.   Critical care was necessary to treat or prevent imminent or life-threatening deterioration.   Critical care was time spent personally by me on the following activities: development of treatment plan with patient and/or surrogate as well as nursing, discussions with consultants, evaluation of patient's response to treatment, examination of patient, obtaining history from patient or surrogate, ordering and performing treatments and interventions, ordering and review of laboratory studies, ordering and review of radiographic studies, pulse oximetry and re-evaluation of patient's condition.    Ottie Glazier, M.D.  Pulmonary & Critical Care Medicine      .

## 2021-07-02 NOTE — Consult Note (Signed)
ANTICOAGULATION CONSULT NOTE - Initial Consult  Pharmacy Consult for Heparin infusion Indication: pulmonary embolus  Allergies  Allergen Reactions   Contrast Media  [Iodinated Diagnostic Agents] Anaphylaxis   Iodine Swelling    (IV only) - angioedema    Patient Measurements: Height: 5' 2.01" (157.5 cm) Weight: 102.9 kg (226 lb 13.7 oz) IBW/kg (Calculated) : 50.12 Heparin Dosing Weight: 74.6 kg   Vital Signs: Temp: 100.22 F (37.9 C) (08/07 2100) Temp Source: Bladder (08/07 2100) BP: 165/58 (08/07 2037) Pulse Rate: 99 (08/07 2100)  Labs: Recent Labs    06/30/21 0320 07/01/21 0508 07/02/21 0508  HGB 10.0* 7.8* 7.5*  HCT 28.8* 23.0* 22.1*  PLT 288 231 190  CREATININE 6.20* 5.40* 4.01*    Estimated Creatinine Clearance: 13.8 mL/min (A) (by C-G formula based on SCr of 4.01 mg/dL (H)).   Medical History: Past Medical History:  Diagnosis Date   Anxiety    Cataract    Depression    Diabetes mellitus without complication ([place])    Gout    History of kidney stones    Hyperlipidemia    Hypertension    Vertigo     Medications:  Heparin [place] 5000 units -- last dose 8/7 @ 2132  Assessment:  [name] admitted 8/2 with acute renal failure, acute respiratory failure secondary to pneumonia. Hospital course complicated by perforated duodenal ulcer and patient was emergently taken to OR 8/5 for ExLap, repair of perforated duodenal ulcer.  8/7: concern for PE -- but unable to perform CT due to contrast allergy. Pharmacy has been consulted for initiation and management of heparin infusion for PE.  8/7 AM: Hbg 7.5 Plt 190  Goal of Therapy:  Heparin level 0.3-0.7 units/ml Monitor platelets by anticoagulation protocol: Yes   Plan:  Given recent SQH dose, low baseline Hbg, and hemodynamic stability will withhold bolus dose  Start heparin infusion at 1200 units/hr Check anti-Xa level in 8 hours and daily while on heparin Continue to monitor H&H and platelets  Dorothe Pea, PharmD, BCPS Clinical Pharmacist   07/02/2021,10:25 PM

## 2021-07-02 NOTE — Progress Notes (Signed)
[facility]  ROUNDING NOTE   Subjective:   Heather Lopez is a 75 y.o. white female who has been admitted for community acquired pneumonia.    Hospital course complicated by duodenal perforation s/p repair on 06/30/2021 ARF - requiring HD Acute resp failure requiring mech ventilation  Patient remains critically ill Intubated, sedated- fentanyl, etomidate Fio2 35% Getting TPN at 65 cc/hr NG tube with dark green aspirate  Objective:  Vital signs in last 24 hours:  Temp:  [97 F (36.1 C)-100.4 F (38 C)] 100.4 F (38 C) (08/07 0800) Pulse Rate:  [66-98] 82 (08/07 0800) Resp:  [13-21] 15 (08/07 0800) BP: (123-184)/(60-123) 159/64 (08/07 0800) SpO2:  [90 %-97 %] 92 % (08/07 0810) Arterial Line BP: (138-194)/(41-71) 147/41 (08/07 0800) FiO2 (%):  [30 %-35 %] 35 % (08/07 0810)  Weight change:  Filed Weights   06/27/21 0028 06/28/21 0100 06/30/21 0345  Weight: 99.8 kg 106.8 kg 102.9 kg    Intake/Output: I/O last 3 completed shifts: In: 1439.6 [I.V.:1189.8; IV Piggyback:249.9] Out: 2133 [Urine:2025; Emesis/NG output:30; Drains:170]   Intake/Output this shift:  Total I/O In: -  Out: 215 [Urine:215]  Physical Exam: General: Critically ill  Head: ETT, OGT  Eyes: Anicteric  Lungs:  Vent assisted  Heart: Regular rate and rhythm  Abdomen:  +JP drains  Extremities:  trace peripheral edema.  Neurologic:  sedated  Skin: No lesions  Access:  RIJ temp cath    Basic Metabolic Panel: Recent Labs  Lab 06/27/21 0911 06/27/21 1459 06/27/21 2231 06/28/21 0458 06/29/21 0601 06/30/21 0250 06/30/21 0320 07/01/21 0508 07/02/21 0508  NA  --  126* 129* 130* 130*  --  130* 135 134*  K  --  5.9* 5.0 4.8 3.8  --  4.4 4.5 4.1  CL  --  95* 96* 96* 95*  --  100 101 102  CO2  --  11* 14* 16* 18*  --  19* 22 28  GLUCOSE  --  224* 261* 264* 240*  --  157* 320* 360*  BUN  --  194* 170* 163* 152*  --  141* 110* 91*  CREATININE  --  9.44* 9.14* 8.66* 6.68*  --  6.20* 5.40*  4.01*  CALCIUM  --  7.8* 7.7* 7.6* 8.1*  --  7.7* 8.6* 8.3*  MG 1.7  --   --   --   --   --  1.7 2.3 2.2  PHOS  --  9.5* 9.1*  --   --  8.2*  --  8.8* 5.1*     Liver Function Tests: Recent Labs  Lab 06/27/21 0713 06/27/21 1459 06/27/21 2231 06/28/21 0458 07/01/21 0508  AST 20  --   --  13* 43*  ALT 18  --   --  16 44  ALKPHOS 56  --   --  62 61  BILITOT 0.5  --   --  0.5 1.1  PROT 6.4*  --   --  6.1* 5.7*  ALBUMIN 2.6* 2.7* 2.6* 2.5* 1.9*    No results for input(s): LIPASE, AMYLASE in the last 168 hours. No results for input(s): AMMONIA in the last 168 hours.  CBC: Recent Labs  Lab 06/27/21 0032 06/27/21 0508 06/27/21 0713 06/28/21 0458 06/29/21 0601 06/30/21 0320 07/01/21 0508 07/02/21 0508  WBC 24.4*   < > 19.2* 20.2* 20.8* 30.3* 30.3* 23.2*  NEUTROABS 21.1*  --  17.9* 18.3*  --  27.9* 28.1*  --   HGB 10.4*   < >  8.9* 8.8* 9.5* 10.0* 7.8* 7.5*  HCT 30.9*   < > 26.4* 25.7* 27.4* 28.8* 23.0* 22.1*  MCV 83.7   < > 83.8 78.4* 78.3* 81.1 81.0 83.1  PLT 483*   < > 343 317 380 288 231 190   < > = values in this interval not displayed.     Cardiac Enzymes: No results for input(s): CKTOTAL, CKMB, CKMBINDEX, TROPONINI in the last 168 hours.  BNP: Invalid input(s): POCBNP  CBG: Recent Labs  Lab 07/01/21 1946 07/02/21 0022 07/02/21 0345 07/02/21 0625 07/02/21 0748  GLUCAP 273* 372* 371* 332* 66*     Microbiology: Results for orders placed or performed during the hospital encounter of 06/27/21  Resp Panel by RT-PCR (Flu A&B, Covid) Nasopharyngeal Swab     Status: None   Collection Time: 06/27/21 12:32 AM   Specimen: Nasopharyngeal Swab; Nasopharyngeal(NP) swabs in vial transport medium  Result Value Ref Range Status   SARS Coronavirus 2 by RT PCR NEGATIVE NEGATIVE Final    Comment: (NOTE) SARS-CoV-2 target nucleic acids are NOT DETECTED.  The SARS-CoV-2 RNA is generally detectable in upper respiratory specimens during the acute phase of infection. The  lowest concentration of SARS-CoV-2 viral copies this assay can detect is 138 copies/mL. A negative result does not preclude SARS-Cov-2 infection and should not be used as the sole basis for treatment or other patient management decisions. A negative result may occur with  improper specimen collection/handling, submission of specimen other than nasopharyngeal swab, presence of viral mutation(s) within the areas targeted by this assay, and inadequate number of viral copies(<138 copies/mL). A negative result must be combined with clinical observations, patient history, and epidemiological information. The expected result is Negative.  Fact Sheet for Patients:  EntrepreneurPulse.com.au  Fact Sheet for Healthcare Providers:  IncredibleEmployment.be  This test is no t yet approved or cleared by the [place] FDA and  has been authorized for detection and/or diagnosis of SARS-CoV-2 by FDA under an Emergency Use Authorization (EUA). This EUA will remain  in effect (meaning this test can be used) for the duration of the COVID-19 declaration under Section 564(b)(1) of the Act, 21 U.S.C.section 360bbb-3(b)(1), unless the authorization is terminated  or revoked sooner.       Influenza A by PCR NEGATIVE NEGATIVE Final   Influenza B by PCR NEGATIVE NEGATIVE Final    Comment: (NOTE) The Xpert Xpress SARS-CoV-2/FLU/RSV plus assay is intended as an aid in the diagnosis of influenza from Nasopharyngeal swab specimens and should not be used as a sole basis for treatment. Nasal washings and aspirates are unacceptable for Xpert Xpress SARS-CoV-2/FLU/RSV testing.  Fact Sheet for Patients: EntrepreneurPulse.com.au  Fact Sheet for Healthcare Providers: IncredibleEmployment.be  This test is not yet approved or cleared by the [place] FDA and has been authorized for detection and/or diagnosis of SARS-CoV-2 by FDA under  an Emergency Use Authorization (EUA). This EUA will remain in effect (meaning this test can be used) for the duration of the COVID-19 declaration under Section 564(b)(1) of the Act, 21 U.S.C. section 360bbb-3(b)(1), unless the authorization is terminated or revoked.  Performed at [facility], [address] [place], [place] [address]   Blood culture (routine x 2)     Status: None   Collection Time: 06/27/21 12:43 AM   Specimen: BLOOD  Result Value Ref Range Status   Specimen Description BLOOD RIGHT ASSIST CONTROL  Final   Special Requests   Final    BOTTLES DRAWN AEROBIC AND ANAEROBIC Blood  Culture results may not be optimal due to an inadequate volume of blood received in culture bottles   Culture   Final    NO GROWTH 5 DAYS Performed at [facility], [address] [place], [place] [address]    Report Status 07/02/2021 FINAL  Final  Blood culture (routine x 2)     Status: None   Collection Time: 06/27/21  1:45 AM   Specimen: BLOOD  Result Value Ref Range Status   Specimen Description BLOOD RIGHT ASSIST CONTROL  Final   Special Requests   Final    BOTTLES DRAWN AEROBIC AND ANAEROBIC Blood Culture adequate volume   Culture   Final    NO GROWTH 5 DAYS Performed at [facility], [address] [place], [place] [address]    Report Status 07/02/2021 FINAL  Final  Urine Culture     Status: Abnormal   Collection Time: 06/27/21  2:39 AM   Specimen: In/Out Cath Urine  Result Value Ref Range Status   Specimen Description   Final    IN/OUT CATH URINE Performed at [facility], [address] [place], [place] [address]    Special Requests   Final    NONE Performed at [facility], [address] [place], [place] [address]    Culture 30,000 COLONIES/mL YEAST (A)  Final   Report Status 06/28/2021 FINAL  Final  MRSA Next Gen by PCR, Nasal     Status: None   Collection Time: 06/30/21  5:01 AM   Specimen: Nasal Mucosa; Nasal  Swab  Result Value Ref Range Status   MRSA by PCR Next Gen NOT DETECTED NOT DETECTED Final    Comment: (NOTE) The GeneXpert MRSA Assay (FDA approved for NASAL specimens only), is one component of a comprehensive MRSA colonization surveillance program. It is not intended to diagnose MRSA infection nor to guide or monitor treatment for MRSA infections. Test performance is not FDA approved in patients less than 69 years old. Performed at [facility], [address] [place], [place] [address]     Coagulation Studies: No results for input(s): LABPROT, INR in the last 72 hours.   Urinalysis: No results for input(s): COLORURINE, LABSPEC, PHURINE, GLUCOSEU, HGBUR, BILIRUBINUR, KETONESUR, PROTEINUR, UROBILINOGEN, NITRITE, LEUKOCYTESUR in the last 72 hours.  Invalid input(s): APPERANCEUR     Imaging: DG Abd 1 View  Result Date: 06/30/2021 CLINICAL DATA:  NG tube placement. EXAM: ABDOMEN - 1 VIEW COMPARISON:  Earlier film, same date. FINDINGS: The NG tube has been advanced. The tip is in the body/antral region of the stomach. Stable bowel gas pattern. IMPRESSION: NG tube in good position in the stomach. Electronically Signed   By: Marijo Sanes M.D.   On: 06/30/2021 15:41   DG Abd 1 View  Result Date: 06/30/2021 CLINICAL DATA:  OG tube placement EXAM: ABDOMEN - 1 VIEW COMPARISON:  09/20/2005. FINDINGS: OG tube tip is in the mid stomach. Proximal side port is in the region of the GE junction. Nonspecific bowel gas pattern with surgical drain overlying the right abdomen. IMPRESSION: OG tube tip is in the mid stomach with proximal side in the region of the GE junction. Tube could be advanced 4-5 cm to ensure positioning of the side port below the GE junction, as warranted. Electronically Signed   By: Misty Stanley M.D.   On: 06/30/2021 10:20   DG Chest Port 1 View  Result Date: 06/30/2021 CLINICAL DATA:  Evaluate central line placement and OG tube  placement. EXAM: PORTABLE CHEST 1 VIEW  COMPARISON:  06/30/2021. FINDINGS: ET tube tip is in satisfactory position above the carina. There is a left chest wall dialysis catheter with tips in the cavoatrial junction and right atrium. New right IJ catheter tip is at the cavoatrial junction. No pneumothorax identified. Stable cardiomediastinal contours. Small left pleural effusion. Subsegmental atelectasis noted in the right base. Atelectasis and airspace opacities identified in the left base. Interstitial edema pattern appears improved. IMPRESSION: 1. New right IJ catheter tip is at the cavoatrial junction. No pneumothorax identified. 2. Persistent atelectasis and airspace disease in the left lung base. Subsegmental atelectasis noted in the left base. 3. Unchanged left pleural effusion. Electronically Signed   By: Kerby Moors M.D.   On: 06/30/2021 15:43   DG Chest Port 1 View  Result Date: 06/30/2021 CLINICAL DATA:  Endotracheal tube present Z97.8 (ICD-10-CM) EXAM: PORTABLE CHEST 1 VIEW COMPARISON:  06/27/2021. FINDINGS: Endotracheal tube tip approximately 5.4 cm above the carina. Left dual lumen IJ central venous catheter with the tip projecting at the right atrium. Gastric tube courses below the diaphragm with the tip outside the field of view. Hazy left basilar opacity, likely in part related to layering small left pleural effusion on this semi erect radiograph. Superimposed bibasilar opacities, including somewhat nodular opacity in the right lower lung. No visible pleural effusions or pneumothorax. Mild enlargement of the cardiomediastinal silhouette, which is accentuated by AP technique. IMPRESSION: 1. Low lung volumes with suspected small layering left pleural effusion and bibasilar opacities that may represent atelectasis, aspiration, and/or pneumonia. 2. Mild cardiomegaly and mild interstitial prominence, suggestive of mild interstitial edema. 3. Rounded, nodular opacity in the right lower lung, likely related to the above findings but  warranting short interval follow-up radiographs (preferably PA and lateral) to ensure resolution and exclude pulmonary nodule. Electronically Signed   By: Margaretha Sheffield MD   On: 06/30/2021 10:24     Medications:    sodium chloride     dexmedetomidine (PRECEDEX) IV infusion 0.6 mcg/kg/hr (07/02/21 1655)   fentaNYL infusion INTRAVENOUS 125 mcg/hr (07/02/21 0808)   fluconazole (DIFLUCAN) IV Stopped (07/01/21 1837)   norepinephrine (LEVOPHED) Adult infusion     piperacillin-tazobactam (ZOSYN)  IV 2.25 g (07/02/21 0526)   TPN ADULT (ION) 65 mL/hr at 07/01/21 1900    chlorhexidine gluconate (MEDLINE KIT)  15 mL Mouth Rinse BID   Chlorhexidine Gluconate Cloth  6 each Topical Q0600   heparin  5,000 Units Subcutaneous Q8H   insulin aspart  0-20 Units Subcutaneous Q4H   insulin aspart  5 Units Subcutaneous Q4H   insulin detemir  20 Units Subcutaneous BID   mouth rinse  15 mL Mouth Rinse 10 times per day   pantoprazole (PROTONIX) IV  40 mg Intravenous Q12H     Assessment/ Plan:  [name] is a 75 y.o.  female who has been admitted for community acquired pneumonia. Patient with weakness for 3-4 days. She has been having shortness of breath. She was evaluated by her PCP, who diagnosed her with pneumonia and she was prescribed levofloxacin.  With perforated   Acute Kidney Injury on chronic kidney disease stage 4 with baseline creatinine 1.97 and GFR of 25 on 12/22/19.  Acute kidney injury secondary to sepsis and obstructive uropathy Chronic kidney disease is secondary to diabetic nephropathy Continue holding losartan and metformin  - continue HD based on daily assessments  - Next HD planned for Tuesday  Lab Results  Component Value Date  CREATININE 4.01 (H) 07/02/2021   CREATININE 5.40 (H) 07/01/2021   CREATININE 6.20 (H) 06/30/2021    Intake/Output Summary (Last 24 hours) at 07/02/2021 0853 Last data filed at 07/02/2021 0808 Gross per 24 hour  Intake 837.87 ml  Output 2015 ml   Net -1177.13 ml    2. Acute Respiratory failure secondary to community acquired pneumonia failing outpatient antibiotics.  Requiring mechanical ventilation.   3. Anemia of chronic kidney disease Normocytic Lab Results  Component Value Date   HGB 7.5 (L) 07/02/2021   Low threshold to initiate ESA Dose to be given 07/01/21  4.  Diabetes mellitus type II with chronic kidney disease insulin dependent. Most recent hemoglobin A1c is 8.3 on 06/12/21.  Metformin held    5. Nephrolithiasis- with obstructive uropathy and symptoms consistent with UTI. Urine culture positive for 30,000 colonies of yeast.  Followed by Urology outpatient with urethral stent placement in past     LOS: 5 Raysha Tilmon 8/7/20228:53 AM

## 2021-07-02 NOTE — Progress Notes (Signed)
Patient ID: Heather Lopez, female   DOB: 1946/04/29, 75 y.o.   MRN: [id number]     [place] Hospital Day(s): 5.   Interval History: Patient seen and examined, no acute events or new complaints overnight. Patient on mechanical ventilation unable to give history.  Upon discussion with patient nurse and ICU physician the patient has been responding adequately.  Vital signs in last 24 hours: [min-max] current  Temp:  [97 F (36.1 C)-100.58 F (38.1 C)] 100.58 F (38.1 C) (08/07 1000) Pulse Rate:  [66-98] 82 (08/07 1000) Resp:  [13-21] 18 (08/07 1000) BP: (135-184)/(62-123) 149/62 (08/07 1000) SpO2:  [90 %-97 %] 90 % (08/07 1000) Arterial Line BP: (138-194)/(41-71) 156/58 (08/07 1000) FiO2 (%):  [30 %-35 %] 35 % (08/07 0810)     Height: 5\' 2"  (157.5 cm) Weight: 102.9 kg BMI (Calculated): 41.48   Physical Exam:  Constitutional: Alert, on mechanical ventilation Respiratory: breathing non-labored at rest on mechanical ventilation Cardiovascular: regular rate and sinus rhythm  Gastrointestinal: soft, non-tender, and non-distended.  Wound is dry and clean.  Drainage serosanguineous.  Labs:  CBC Latest Ref Rng & Units 07/02/2021 07/01/2021 06/30/2021  WBC 4.0 - 10.5 K/uL 23.2(H) 30.3(H) 30.3(H)  Hemoglobin 12.0 - 15.0 g/dL 7.5(L) 7.8(L) 10.0(L)  Hematocrit 36.0 - 46.0 % 22.1(L) 23.0(L) 28.8(L)  Platelets 150 - 400 K/uL 190 231 288   CMP Latest Ref Rng & Units 07/02/2021 07/01/2021 06/30/2021  Glucose 70 - 99 mg/dL 360(H) 320(H) 157(H)  BUN 8 - 23 mg/dL 91(H) 110(H) 141(H)  Creatinine 0.44 - 1.00 mg/dL 4.01(H) 5.40(H) 6.20(H)  Sodium 135 - 145 mmol/L 134(L) 135 130(L)  Potassium 3.5 - 5.1 mmol/L 4.1 4.5 4.4  Chloride 98 - 111 mmol/L 102 101 100  CO2 22 - 32 mmol/L 28 22 19(L)  Calcium 8.9 - 10.3 mg/dL 8.3(L) 8.6(L) 7.7(L)  Total Protein 6.5 - 8.1 g/dL - 5.7(L) -  Total Bilirubin 0.3 - 1.2 mg/dL - 1.1 -  Alkaline Phos 38 - 126 U/L - 61 -  AST 15 - 41 U/L - 43(H) -  ALT 0 - 44 U/L - 44 -     Imaging studies: No new pertinent imaging studies   Assessment/Plan:  75 y.o. female with duodenal perforation 3 Days Post-Op s/p repair, complicated by pertinent comorbidities including acute renal failure on hemodialysis, type 2 diabetes mellitus, hypertension.  Patient has been recovering slowly.  As per discussion with ICU team we will give a trial of extubation possibly today.  No contraindication from surgical standpoint.  I would recommend to continue nasogastric tube to suction.  Continue strict NPO.  Drain without any concern of bile leak.  Continue IV antibiotic therapy.  We will follow closely.  Arnold Long, MD

## 2021-07-02 NOTE — Plan of Care (Signed)
At beginning of shift, blood pressure was elevated, pt responds and nods appropriately, I titrated her fentanyl up to 150 and resumed her precedex drip so that she would be able to rest and to bring her blood pressure.

## 2021-07-02 NOTE — Progress Notes (Addendum)
Suspected Pulmonary embolism in the setting of recent surgery & immobilization Patient POD #3 from duodenal perforation repair, no signs of bleeding but Hgb has trended down- 7.5 on 07/02/21. Patient hypoxic today on mechanical ventilation requiring an increase in her FiO2. CXR demonstrated slight increase in LEFT pleural effusion. Fibrinogen level >800, unable to perform CT angio as patient has anaphylactic reaction to contrast media. Discussed with Dr. Windell Moment who cleared use of systemic anticoagulation. Wells Score: 3.0, moderate risk - Systemic Heparin drip per pharmacy protocol without bolus as patient has received SQ heparin for VTE and hgb this AM was 7.5 - STAT H&H to see Hgb prior to heparin initiation - Supplemental O2 to maintain SpO2 > 90% - BLE dopplers ordered to assess for DVT - Consider V/Q scan in AM - consider Vascular consult if IVC filter/ thrombectomy is warranted   Domingo Pulse [place], AGACNP-BC Acute Care Nurse Practitioner [facility]   [phone] / [phone] Please see Amion for pager details.

## 2021-07-02 NOTE — Consult Note (Signed)
PHARMACY - TOTAL PARENTERAL NUTRITION CONSULT NOTE   Indication:  Prolonged NPO  Patient Measurements: Height: 5\' 2"  (157.5 cm) Weight: 102.9 kg (226 lb 13.7 oz) IBW/kg (Calculated) : 50.1 TPN AdjBW (KG): 62.5 Body mass index is 41.49 kg/m.  Assessment:  Patient is a [name] with medical history including HTN, diabetes, HLD, anxiety / depression, gout who was admitted 8/2 with acute renal failure, acute respiratory failure secondary to pneumonia. Hospital course complicated by perforated duodenal ulcer and patient was emergently taken to OR 8/5 for ExLap, repair of perforated duodenal ulcer with creation of omental flap. Post-operatively, patient admitted to the ICU where she remains intubated, sedated, and on mechanical ventilation. Pharmacy has been consulted to initiate TPN for prolonged NPO status.   Glucose / Insulin: Last A1c 8.3%.  24 hr glucose range 195 - 372 24 hr SSI requirements 123 units Electrolytes: hyperphosphatemia Renal: Nephrology following for iHD Hepatic: Within normal limits on admission Intake / Output; MIVF: Net negative 3.2 L for the admission GI Imaging: 8/4 CT Abdomen / pelvis: Extensive pneumoperitoneum compatible with perforated viscus. Large ill-defined mass arising from the upper pole left kidney consistent with renal cell carcinoma.  Acute left-sided hydronephrosis due to a 12 mm left UPJ calculus. Chronic right hydronephrosis and hydroureter without clear etiology. GI Surgeries / Procedures:  8/5: ExLap with repair of perforated duodenal ulcer with creation of omental flap  Central access: 8/5  TPN start date:  8/5  Nutritional Goals (per RD recommendation on 8/5): kCal: 1150 - 1450 kcal/day, Protein: 100 - 125 g/day, Fluid: 1.2 - 1.5 L/day  Goal TPN rate is 65 mL/hr (provides 101 g of protein and 1379 kcals per day)  Current Nutrition:  NPO  Plan:  --Continue TPN at 65 mL/hr Amino acids: 101.4 g Dextrose: 171.6 g Lipids: 39 g Kcal:  1379 Fluid: 1660 mL (including overfill) --Electrolytes in TPN: Na 52mEq/L, K 11mEq/L, Ca 69mEq/L, Mg 16mEq/L, and Phos 67mmol/L. Cl:Ac 1:1 Remove phosphorus for now Patient is receiving iHD --Add standard MVI and trace elements to TPN Hold chromium for now given renal dysfunction (on iHD) Add thiamine / folic acid x 3 days (day 3 / 3) --SSI increased to resistant scale 8/6, Levemir 20 units BID + Novolog 5 units q4h added 8/7. Will hold off on further adjustments. May consider adding insulin to TPN depending on 24 hr requirements --Monitor TPN labs on Mon/Thurs at minimum  Tawnya Crook, PharmD, BCPS 07/02/2021,9:28 AM

## 2021-07-03 ENCOUNTER — Inpatient Hospital Stay
Admit: 2021-07-03 | Discharge: 2021-07-03 | Disposition: A | Discharge status: Home / Self Care | Payer: Medicare Other | Attending: Pulmonary Disease | Admitting: Pulmonary Disease

## 2021-07-03 ENCOUNTER — Other Ambulatory Visit: Payer: Medicare Other

## 2021-07-03 DIAGNOSIS — Z7189 Other specified counseling: Secondary | ICD-10-CM

## 2021-07-03 DIAGNOSIS — N170 Acute kidney failure with tubular necrosis: Secondary | ICD-10-CM

## 2021-07-03 DIAGNOSIS — J9601 Acute respiratory failure with hypoxia: Secondary | ICD-10-CM | POA: Diagnosis not present

## 2021-07-03 DIAGNOSIS — K631 Perforation of intestine (nontraumatic): Secondary | ICD-10-CM | POA: Diagnosis not present

## 2021-07-03 LAB — CBC
HCT: 22.3 % — ABNORMAL LOW (ref 36.0–46.0)
Hemoglobin: 7.4 g/dL — ABNORMAL LOW (ref 12.0–15.0)
MCH: 28.1 pg (ref 26.0–34.0)
MCHC: 33.2 g/dL (ref 30.0–36.0)
MCV: 84.8 fL (ref 80.0–100.0)
Platelets: 161 10*3/uL (ref 150–400)
RBC: 2.63 MIL/uL — ABNORMAL LOW (ref 3.87–5.11)
RDW: 15.4 % (ref 11.5–15.5)
WBC: 28.9 10*3/uL — ABNORMAL HIGH (ref 4.0–10.5)
nRBC: 0.1 % (ref 0.0–0.2)

## 2021-07-03 LAB — DIFFERENTIAL
Abs Immature Granulocytes: 0.69 10*3/uL — ABNORMAL HIGH (ref 0.00–0.07)
Basophils Absolute: 0.1 10*3/uL (ref 0.0–0.1)
Basophils Relative: 0 %
Eosinophils Absolute: 0.1 10*3/uL (ref 0.0–0.5)
Eosinophils Relative: 0 %
Immature Granulocytes: 2 %
Lymphocytes Relative: 2 %
Lymphs Abs: 0.7 10*3/uL (ref 0.7–4.0)
Monocytes Absolute: 0.7 10*3/uL (ref 0.1–1.0)
Monocytes Relative: 3 %
Neutro Abs: 26.6 10*3/uL — ABNORMAL HIGH (ref 1.7–7.7)
Neutrophils Relative %: 93 %

## 2021-07-03 LAB — COMPREHENSIVE METABOLIC PANEL
ALT: 34 U/L (ref 0–44)
AST: 38 U/L (ref 15–41)
Albumin: 1.6 g/dL — ABNORMAL LOW (ref 3.5–5.0)
Alkaline Phosphatase: 77 U/L (ref 38–126)
Anion gap: 9 (ref 5–15)
BUN: 98 mg/dL — ABNORMAL HIGH (ref 8–23)
CO2: 25 mmol/L (ref 22–32)
Calcium: 8.4 mg/dL — ABNORMAL LOW (ref 8.9–10.3)
Chloride: 107 mmol/L (ref 98–111)
Creatinine, Ser: 3.85 mg/dL — ABNORMAL HIGH (ref 0.44–1.00)
GFR, Estimated: 12 mL/min — ABNORMAL LOW (ref >60)
Glucose, Bld: 234 mg/dL — ABNORMAL HIGH (ref 70–99)
Potassium: 3.8 mmol/L (ref 3.5–5.1)
Sodium: 141 mmol/L (ref 135–145)
Total Bilirubin: 0.4 mg/dL (ref 0.3–1.2)
Total Protein: 5.7 g/dL — ABNORMAL LOW (ref 6.5–8.1)

## 2021-07-03 LAB — H PYLORI, IGM, IGG, IGA AB
H Pylori IgG: 0.14 Index Value (ref 0.00–0.79)
H. Pylogi, Iga Abs: 16.9 units — ABNORMAL HIGH (ref 0.0–8.9)
H. Pylogi, Igm Abs: <9 units (ref 0.0–8.9)

## 2021-07-03 LAB — GLUCOSE, CAPILLARY
Glucose-Capillary: 189 mg/dL — ABNORMAL HIGH (ref 70–99)
Glucose-Capillary: 210 mg/dL — ABNORMAL HIGH (ref 70–99)
Glucose-Capillary: 211 mg/dL — ABNORMAL HIGH (ref 70–99)
Glucose-Capillary: 221 mg/dL — ABNORMAL HIGH (ref 70–99)
Glucose-Capillary: 221 mg/dL — ABNORMAL HIGH (ref 70–99)
Glucose-Capillary: 233 mg/dL — ABNORMAL HIGH (ref 70–99)
Glucose-Capillary: 237 mg/dL — ABNORMAL HIGH (ref 70–99)
Glucose-Capillary: 257 mg/dL — ABNORMAL HIGH (ref 70–99)

## 2021-07-03 LAB — HEPARIN LEVEL (UNFRACTIONATED): Heparin Unfractionated: 0.5 IU/mL (ref 0.30–0.70)

## 2021-07-03 LAB — PREALBUMIN: Prealbumin: 5.8 mg/dL — ABNORMAL LOW (ref 18–38)

## 2021-07-03 LAB — PHOSPHORUS: Phosphorus: 2.9 mg/dL (ref 2.5–4.6)

## 2021-07-03 LAB — TRIGLYCERIDES: Triglycerides: 116 mg/dL (ref <150)

## 2021-07-03 LAB — MAGNESIUM: Magnesium: 2 mg/dL (ref 1.7–2.4)

## 2021-07-03 MED ORDER — HEPARIN SODIUM (PORCINE) 5000 UNIT/ML IJ SOLN
5000.0000 [IU] | Freq: Three times a day (TID) | INTRAMUSCULAR | Status: DC
  Administered 2021-07-03 – 2021-07-07 (×12): 5000 [IU] via SUBCUTANEOUS
  Filled 2021-07-03 (×12): qty 1

## 2021-07-03 MED ORDER — ESMOLOL HCL-SODIUM CHLORIDE 2000 MG/100ML IV SOLN
25.0000 ug/kg/min | INTRAVENOUS | Status: DC
  Administered 2021-07-03: 25 ug/kg/min via INTRAVENOUS
  Filled 2021-07-03 (×3): qty 100

## 2021-07-03 MED ORDER — LABETALOL HCL 5 MG/ML IV SOLN
10.0000 mg | INTRAVENOUS | Status: DC | PRN
  Administered 2021-07-03 (×3): 10 mg via INTRAVENOUS
  Filled 2021-07-03 (×3): qty 4

## 2021-07-03 MED ORDER — TECHNETIUM TO 99M ALBUMIN AGGREGATED
3.8500 | Freq: Once | INTRAVENOUS | Status: AC | PRN
  Administered 2021-07-03: 3.85 via INTRAVENOUS

## 2021-07-03 MED ORDER — TRAVASOL 10 % IV SOLN
INTRAVENOUS | Status: AC
  Filled 2021-07-03: qty 1014

## 2021-07-03 NOTE — Consult Note (Signed)
PHARMACY - TOTAL PARENTERAL NUTRITION CONSULT NOTE   Indication:  Prolonged NPO  Patient Measurements: Height: 5' 2.01" (157.5 cm) Weight: 102.9 kg (226 lb 13.7 oz) IBW/kg (Calculated) : 50.12 TPN AdjBW (KG): 62.5 Body mass index is 41.48 kg/m.  Assessment:  Patient is a [name] with medical history including HTN, diabetes, HLD, anxiety / depression, gout who was admitted 8/2 with acute renal failure, acute respiratory failure secondary to pneumonia. Hospital course complicated by perforated duodenal ulcer and patient was emergently taken to OR 8/5 for ExLap, repair of perforated duodenal ulcer with creation of omental flap. Post-operatively, patient admitted to the ICU where she remains intubated, sedated, and on mechanical ventilation. Pharmacy has been consulted to initiate TPN for prolonged NPO status.   Glucose / Insulin: Last A1c 8.3%.  24 hr glucose range 169 - 234 24 hr SSI requirements 79 units Electrolytes: Within normal limits. Hyperphosphatemia resolved Renal: Nephrology following for iHD. Scr down-trending. 1055 mL UOP yesterday Hepatic: Within normal limits Intake / Output; MIVF: Net negative 1.3 L for the admission. 1055 mL UOP yesterday. 220 mL out of abdominal drains yesterday. GI Imaging: 8/4 CT Abdomen / pelvis: Extensive pneumoperitoneum compatible with perforated viscus. Large ill-defined mass arising from the upper pole left kidney consistent with renal cell carcinoma.  Acute left-sided hydronephrosis due to a 12 mm left UPJ calculus. Chronic right hydronephrosis and hydroureter without clear etiology. GI Surgeries / Procedures:  8/5: ExLap with repair of perforated duodenal ulcer with creation of omental flap  Central access: 8/5  TPN start date:  8/5  Nutritional Goals (per RD recommendation on 8/5): kCal: 1150 - 1450 kcal/day, Protein: 100 - 125 g/day, Fluid: 1.2 - 1.5 L/day  Goal TPN rate is 65 mL/hr (provides 101 g of protein and 1379 kcals per  day)  Current Nutrition:  NPO  Plan:  --Continue TPN at 65 mL/hr Amino acids: 101.4 g Dextrose: 171.6 g Lipids: 39 g Kcal: 1379 Fluid: 1660 mL (including overfill) --Electrolytes in TPN: Na 67mEq/L, K 40mEq/L, Ca 14mEq/L, Mg 47mEq/L, and Phos 67mmol/L. Cl:Ac 1:1 Will add back small amount of phosphorous to TPN based on trend / possible renal recovery Will increase potassium concentration slightly based on trend / possible renal recovery --Add standard MVI and trace elements to TPN Hold chromium for now given renal dysfunction (on iHD) Completed thiamine / folic acid x 3 days --Continue rSSI + Levemir 20 units BID. Will stop Novolog 5 units q4h. Will consider stopping Levemir and try to consolidate insulin requirements into TPN only. Will add 50 units of insulin to TPN today --Monitor TPN labs on Mon/Thurs at minimum  Harsha Yusko B Donney Rankins 07/03/2021,7:46 AM

## 2021-07-03 NOTE — Anesthesia Postprocedure Evaluation (Signed)
Anesthesia Post Note  Patient: Heather Lopez  Procedure(s) Performed: EXPLORATORY LAPAROTOMY WITH REPAIR OF DUODENAL PERFORATION (Abdomen)  Patient location during evaluation: PACU Anesthesia Type: General Level of consciousness: awake and alert Pain management: pain level controlled Vital Signs Assessment: post-procedure vital signs reviewed and stable Respiratory status: spontaneous breathing, nonlabored ventilation, respiratory function stable and patient connected to nasal cannula oxygen Cardiovascular status: blood pressure returned to baseline and stable Postop Assessment: no apparent nausea or vomiting Anesthetic complications: no   No notable events documented.   Last Vitals:  Vitals:   07/03/21 1236 07/03/21 1300  BP:  (!) 170/57  Pulse:  94  Resp:  18  Temp:  (!) 38.1 C  SpO2: 92% 93%    Last Pain:  Vitals:   07/03/21 1200  TempSrc: Bladder  PainSc:                  [name]

## 2021-07-03 NOTE — Consult Note (Addendum)
Consultation Note Date: 07/03/2021   Patient Name: Heather Lopez  DOB: 01-07-1946  MRN: [id number]  Age / Sex: 75 y.o., female  PCP: [name], PA-C Referring Physician: Tyler Pita, MD  Reason for Consultation: Establishing goals of care  HPI/Patient Profile: 75 y.o with significant PMH as below who presented to the ED on 06/27/21 with chief complaints of progressive shortness of breath, productive cough, loss of appetite, nausea, dry heaving fatigue, malaise and generalized body aches. Patient initially went to her PCP on 7/29 with complaints of rhinorrhea, harsh cough, loss of appetite, nausea, fatigue, malaise and generalized body aches x2 days, patient was diagnosed with pneumonia of which she received ceftriaxone 1 mg IM and sent home with Levaquin, prednisone and Tessalon.  Per ED reports, patient reported progressive worsening of her symptoms during ED visit.  Clinical Assessment and Goals of Care: Patient is resting in bed on ventilator. Spoke with husband on phone. He states they have 2 children. She is the church organist and pianist.   Husband states she has been suffering from debilitating pain in her hips for quite a while now. He states she has been walking with 2 walkers at a maximum of 30f distances, and uses a wheelchair for anything further. He states she has been struggling with her A1C for a while. He states she was advised she would have to lose weight to qualify for hip replacement, and after doing so, had the procedure scheduled for later this month.   We discussed her diagnosis, prognosis, GOC, EOL wishes disposition and options. He has been well updated by the medical teams.   A detailed discussion was had today regarding advanced directives.  Concepts specific to code status, artifical feeding and hydration, IV antibiotics and rehospitalization were discussed.  The difference  between an aggressive medical intervention path and a comfort care path was discussed.  Values and goals of care important to patient and family were attempted to be elicited.  Discussed limitations of medical interventions to prolong quality of life in some situations and discussed the concept of human mortality.  He states as far as he knows, his wife would want all care available at present.        SUMMARY OF RECOMMENDATIONS   Full code/full scope at present. PMT will continue to follow.   Prognosis:  Poor overall      Primary Diagnoses: Present on Admission:  Acute respiratory failure with hypoxemia (HCC)  ARF (acute renal failure) (HCC)  Hyperlipidemia associated with type 2 diabetes mellitus ([facility]  Acute renal failure (ARF) ([facility]   I have reviewed the medical record, interviewed the patient and family, and examined the patient. The following aspects are pertinent.  Past Medical History:  Diagnosis Date   Anxiety    Cataract    Depression    Diabetes mellitus without complication ([facility]    Gout    History of kidney stones    Hyperlipidemia    Hypertension    Vertigo    Social History  Socioeconomic History   Marital status: Married    Spouse name: Not on file   Number of children: Not on file   Years of education: Not on file   Highest education level: Not on file  Occupational History   Not on file  Tobacco Use   Smoking status: Former    Packs/day: 0.75    Years: 6.00    Pack years: 4.50    Types: Cigarettes    Quit date: 43    Years since quitting: 49.6   Smokeless tobacco: Never   Tobacco comments:    quit 1973  Vaping Use   Vaping Use: Never used  Substance and Sexual Activity   Alcohol use: No    Alcohol/week: 0.0 standard drinks   Drug use: No   Sexual activity: Not on file  Other Topics Concern   Not on file  Social History Narrative   Not on file   Social Determinants of Health   Financial Resource Strain: Not on file  Food  Insecurity: Not on file  Transportation Needs: Not on file  Physical Activity: Not on file  Stress: Not on file  Social Connections: Not on file   Family History  Problem Relation Age of Onset   Diabetes Father    Cancer Father        lung cancer   Diabetes Brother    Stroke Mother    Scheduled Meds:  chlorhexidine gluconate (MEDLINE KIT)  15 mL Mouth Rinse BID   Chlorhexidine Gluconate Cloth  6 each Topical Q0600   heparin  5,000 Units Subcutaneous Q8H   insulin aspart  0-20 Units Subcutaneous Q4H   insulin aspart  5 Units Subcutaneous Q4H   insulin detemir  20 Units Subcutaneous BID   mouth rinse  15 mL Mouth Rinse 10 times per day   pantoprazole (PROTONIX) IV  40 mg Intravenous Q12H   Continuous Infusions:  sodium chloride     dexmedetomidine (PRECEDEX) IV infusion Stopped (07/02/21 1301)   fentaNYL infusion INTRAVENOUS 75 mcg/hr (07/03/21 1200)   fluconazole (DIFLUCAN) IV Stopped (07/02/21 1834)   piperacillin-tazobactam (ZOSYN)  IV 2.25 g (07/03/21 1319)   TPN ADULT (ION) 65 mL/hr at 07/03/21 1200   TPN ADULT (ION)     PRN Meds:.sodium chloride, acetaminophen, alteplase, fentaNYL, heparin, labetalol, lidocaine (PF), lidocaine-prilocaine, ondansetron (ZOFRAN) IV, pentafluoroprop-tetrafluoroeth Medications Prior to Admission:  Prior to Admission medications   Medication Sig Start Date End Date Taking? Authorizing Provider  amLODipine (NORVASC) 10 MG tablet Take 1 tablet (10 mg total) by mouth daily. 04/15/18  Yes Mikey College, NP  aspirin EC 81 MG tablet Take 81 mg by mouth daily.   Yes [provider]  atorvastatin (LIPITOR) 20 MG tablet Take 1 tablet (20 mg total) by mouth daily. 04/15/18  Yes Mikey College, NP  benzonatate (TESSALON) 200 MG capsule Take 200 mg by mouth 3 (three) times daily as needed. 06/23/21  Yes [provider]  buPROPion (WELLBUTRIN XL) 150 MG 24 hr tablet Take 1 tablet (150 mg total) by mouth daily. 05/26/18  Yes  Mikey College, NP  insulin aspart (NOVOLOG FLEXPEN) 100 UNIT/ML FlexPen Use meal coverage 6 units with every meal plus correction scale three times daily up to total of 35 units per day. 04/15/18  Yes Mikey College, NP  Insulin Pen Needle 32G X 4 MM MISC Inject insulin pens up to 5 times daily 04/15/18  Yes Mikey College, NP  Lancets (FREESTYLE) lancets USE  AS INSTRUCTED 06/16/19  Yes Karamalegos, Alexander J, DO  LEVEMIR FLEXTOUCH 100 UNIT/ML Pen INJECT 50 UNITS IN THE MORNING AND 44 UNITS IN THE EVENING Patient taking differently: Inject 52 Units into the skin 2 (two) times daily. 06/30/18  Yes Mikey College, NP  levofloxacin (LEVAQUIN) 500 MG tablet Take 500 mg by mouth daily. 06/23/21  Yes [provider]  losartan (COZAAR) 50 MG tablet Take 1 tablet (50 mg total) by mouth daily. 04/15/18  Yes Mikey College, NP  metFORMIN (GLUCOPHAGE) 1000 MG tablet Take 1 tablet (1,000 mg total) by mouth 2 (two) times daily. 04/15/18  Yes Mikey College, NP  metoprolol tartrate (LOPRESSOR) 25 MG tablet Take 1 tablet (25 mg total) by mouth 2 (two) times daily. 05/26/18  Yes Mikey College, NP  Multiple Vitamin (MULTI-VITAMIN) tablet Take 1 tablet by mouth daily.    Yes [provider]  ondansetron (ZOFRAN-ODT) 4 MG disintegrating tablet Take 4 mg by mouth every 8 (eight) hours as needed. 06/23/21  Yes [provider]  predniSONE (DELTASONE) 20 MG tablet Take 20 mg by mouth 2 (two) times daily. 06/23/21  Yes [provider]  TRULICITY 0.03 KJ/1.7HX SOPN SMARTSIG:0.5 Milliliter(s) SUB-Q Once a Week 06/19/21  Yes [provider]   Allergies  Allergen Reactions   Contrast Media  [Iodinated Diagnostic Agents] Anaphylaxis   Iodine Swelling    (IV only) - angioedema   Review of Systems  Unable to perform ROS  Physical Exam Constitutional:      Comments: Eyes closed. On ventilator.     Vital Signs: BP (!) 170/57   Pulse 94    Temp (!) 100.58 F (38.1 C)   Resp 18   Ht 5' 2.01" (1.575 m)   Wt 102.9 kg   SpO2 93%   BMI 41.48 kg/m  Pain Scale: CPOT POSS *See Group Information*: S-Acceptable,Sleep, easy to arouse Pain Score: 8    SpO2: SpO2: 93 % O2 Device:SpO2: 93 % O2 Flow Rate: .O2 Flow Rate (L/min): 10 L/min  IO: Intake/output summary:  Intake/Output Summary (Last 24 hours) at 07/03/2021 1320 Last data filed at 07/03/2021 1200 Gross per 24 hour  Intake 1991.52 ml  Output 1655 ml  Net 336.52 ml    LBM: Last BM Date: 06/29/21 Baseline Weight: Weight: 99.8 kg Most recent weight: Weight: 102.9 kg       Time In: 12:35 Time Out: 1:25 Time Total: 50 min Greater than 50%  of this time was spent counseling and coordinating care related to the above assessment and plan.  Signed by: Asencion Gowda, NP   Please contact Palliative Medicine Team phone at [phone] for questions and concerns.  For individual provider: See Shea Evans

## 2021-07-03 NOTE — Progress Notes (Addendum)
Patient seen and evaluated with Mr. [name] and agree with his note.  Patietn having some low grade temperature, and her WBC more elevated today.  However, VQ scan was negative for PE, and her drains remain serous/serosanguinous.  No pressors, renal function and acidosis improving with dialysis.  For now, continue IV abx, continue NPO with TPN, and will do contrast study tomorrow.  May end up doing CT scan depending on what her temp/WBC continue doing.  Agree with stopping anticoagulation now that VQ study was negative.  [name], MD    [facility] Day(s): 6.   Post op day(s): 4 Days Post-Op.   Interval History:  Patient seen and examined Concern for potential PE yesterday; started on systemic anticoagulation with Heparin; can not obtain CTA, may get VQ scan today Remains intubated; failed SBT over the weekend secondary to hypoxemia  She remains off vasopressors Still with leukocytosis to 28.9K Hgb relatively stable at 7.4; no signs of bleeding Renal function with slight improvements; sCr down to 3.85; UO - 1055 ccs No significant electrolyte derangements Surgical drains:   - Right mid abdomen (draining repair site); 120 ccs; serous  - RLQ (draining dependent pelvis): 80 ccs; serous She continues on NGT decompression; LIS; output minimal and not recorded Continues on Zosyn Continue on TPN; goal rate  Vital signs in last 24 hours: [min-max] current  Temp:  [99.86 F (37.7 C)-100.6 F (38.1 C)] 100.04 F (37.8 C) (08/08 0600) Pulse Rate:  [82-108] 101 (08/08 0600) Resp:  [14-23] 14 (08/08 0600) BP: (149-179)/(58-70) 173/59 (08/08 0400) SpO2:  [89 %-95 %] 95 % (08/08 0600) Arterial Line BP: (147-184)/(41-73) 164/49 (08/08 0600) FiO2 (%):  [35 %-50 %] 50 % (08/08 0500)     Height: 5' 2.01" (157.5 cm) Weight: 102.9 kg BMI (Calculated): 41.48   Intake/Output last 2 shifts:  08/07 0701 - 08/08 0700 In: 2034.6 [I.V.:1784.5; IV  Piggyback:250] Out: 5465 [Urine:1055; Drains:220]   Physical Exam:  Constitutional: intubated, sedated HEENT: NGT in place; output slowing; bilious fluid in canister (~200 ccs) Respiratory: intubated Cardiovascular: tachycardic to 105, sinus rhythm Gastrointestinal: Soft, unable to reliably assess tenderness, non-distended, surgical drains x2 in right lower abdomen, output serosanguinous Integumentary: Midline incision is CDI with staples and penrose drain, honeycomb in place   Labs:  CBC Latest Ref Rng & Units 07/03/2021 07/02/2021 07/02/2021  WBC 4.0 - 10.5 K/uL 28.9(H) - 23.2(H)  Hemoglobin 12.0 - 15.0 g/dL 7.4(L) 7.5(L) 7.5(L)  Hematocrit 36.0 - 46.0 % 22.3(L) 23.2(L) 22.1(L)  Platelets 150 - 400 K/uL 161 - 190   CMP Latest Ref Rng & Units 07/03/2021 07/02/2021 07/01/2021  Glucose 70 - 99 mg/dL 234(H) 360(H) 320(H)  BUN 8 - 23 mg/dL 98(H) 91(H) 110(H)  Creatinine 0.44 - 1.00 mg/dL 3.85(H) 4.01(H) 5.40(H)  Sodium 135 - 145 mmol/L 141 134(L) 135  Potassium 3.5 - 5.1 mmol/L 3.8 4.1 4.5  Chloride 98 - 111 mmol/L 107 102 101  CO2 22 - 32 mmol/L 25 28 22   Calcium 8.9 - 10.3 mg/dL 8.4(L) 8.3(L) 8.6(L)  Total Protein 6.5 - 8.1 g/dL 5.7(L) - 5.7(L)  Total Bilirubin 0.3 - 1.2 mg/dL 0.4 - 1.1  Alkaline Phos 38 - 126 U/L 77 - 61  AST 15 - 41 U/L 38 - 43(H)  ALT 0 - 44 U/L 34 - 44     Imaging studies: No new pertinent imaging studies   Assessment/Plan:  75 y.o. female requiring ventilator support 4 Days Post-Op s/p  exploratory laparotomy and primary repair of duodenal ulcer with omental patch, complicated by potential PE   - Greatly appreciate PCCM assistance; ventilatory support   - Agree with plan for VQ scan if concern for PE; she has what looks like an anaphylactic allergy to IV contrast - Okay for anticoagulation from surgical perspective; Hgb stable - Continue strict NPO x1 more day; will plan on UGI exam to revaluate repair tomorrow (08/09)  - Continue NGT decompression; LIS; monitor  ad record output   - Continue TPN; at goal rate. Once repair is reassessed and without leak, we may be able to transition to trickle feeds via NGT  - Continue IV Abx (Zosyn); Day 4 - Monitor abdominal examination; on-going bowel function - Maintain surgical drains; monitor and record output   All of the above findings and recommendations were discussed with the medical team.   -- Edison Simon, PA-C [facility] 07/03/2021, 7:18 AM [phone] M-F: 7am - 4pm

## 2021-07-03 NOTE — Progress Notes (Signed)
Verdis Frederickson RN taking over care of Mrs. Atwater. Report given.

## 2021-07-03 NOTE — Progress Notes (Signed)
[facility]  ROUNDING NOTE   Subjective:   Heather Lopez is a 75 y.o. white female who has been admitted for community acquired pneumonia.    Hospital course complicated by duodenal perforation s/p repair on 06/30/2021 ARF - requiring HD PRN Acute resp failure requiring mech ventilation  Patient remains critically ill Intubated, sedated- fentanyl, but able to respond to few simple commands Fio2 50% Getting TPN at 65 cc/hr NG tube with dark green aspirate  Objective:  Vital signs in last 24 hours:  Temp:  [99.86 F (37.7 C)-100.6 F (38.1 C)] 100.04 F (37.8 C) (08/08 0713) Pulse Rate:  [82-108] 105 (08/08 0713) Resp:  [14-23] 16 (08/08 0713) BP: (149-179)/(58-70) 171/67 (08/08 0713) SpO2:  [89 %-95 %] 95 % (08/08 0858) Arterial Line BP: (152-184)/(49-90) 175/90 (08/08 0713) FiO2 (%):  [35 %-50 %] 50 % (08/08 0858)  Weight change:  Filed Weights   06/27/21 0028 06/28/21 0100 06/30/21 0345  Weight: 99.8 kg 106.8 kg 102.9 kg    Intake/Output: I/O last 3 completed shifts: In: 3232.3 [I.V.:2882.3; IV Piggyback:350] Out: 2330 [Urine:2030; Drains:300]   Intake/Output this shift:  No intake/output data recorded.  Physical Exam: General: Critically ill  Head: ETT, OGT  Eyes: Anicteric  Lungs:  Vent assisted  Heart: Regular rate and rhythm  Abdomen:  +JP drains  Extremities:  trace peripheral edema.  Neurologic:  sedated  Skin: No lesions  Access:  RIJ temp cath    Basic Metabolic Panel: Recent Labs  Lab 06/27/21 0911 06/27/21 1459 06/27/21 2231 06/28/21 0458 06/29/21 0601 06/30/21 0250 06/30/21 0320 07/01/21 0508 07/02/21 0508 07/03/21 0509  NA  --    < > 129*   < > 130*  --  130* 135 134* 141  K  --    < > 5.0   < > 3.8  --  4.4 4.5 4.1 3.8  CL  --    < > 96*   < > 95*  --  100 101 102 107  CO2  --    < > 14*   < > 18*  --  19* _0 GLUCOSE  --    < > 261*   < > 240*  --  157* 320* 360* 234*  BUN  --    < > 170*   < > 152*  --  141* 110*  91* 98*  CREATININE  --    < > 9.14*   < > 6.68*  --  6.20* 5.40* 4.01* 3.85*  CALCIUM  --    < > 7.7*   < > 8.1*  --  7.7* 8.6* 8.3* 8.4*  MG 1.7  --   --   --   --   --  1.7 2.3 2.2 2.0  PHOS  --    < > 9.1*  --   --  8.2*  --  8.8* 5.1* 2.9   < > = values in this interval not displayed.     Liver Function Tests: Recent Labs  Lab 06/27/21 0713 06/27/21 1459 06/27/21 2231 06/28/21 0458 07/01/21 0508 07/03/21 0509  AST 20  --   --  13* 43* 38  ALT 18  --   --  16 44 34  ALKPHOS 56  --   --  62 61 77  BILITOT 0.5  --   --  0.5 1.1 0.4  PROT 6.4*  --   --  6.1* 5.7* 5.7*  ALBUMIN 2.6* 2.7* 2.6*  2.5* 1.9* 1.6*    No results for input(s): LIPASE, AMYLASE in the last 168 hours. No results for input(s): AMMONIA in the last 168 hours.  CBC: Recent Labs  Lab 06/27/21 0713 06/28/21 0458 06/29/21 0601 06/30/21 0320 07/01/21 0508 07/02/21 0508 07/02/21 2245 07/03/21 0509  WBC 19.2* 20.2* 20.8* 30.3* 30.3* 23.2*  --  28.9*  NEUTROABS 17.9* 18.3*  --  27.9* 28.1*  --   --  26.6*  HGB 8.9* 8.8* 9.5* 10.0* 7.8* 7.5* 7.5* 7.4*  HCT 26.4* 25.7* 27.4* 28.8* 23.0* 22.1* 23.2* 22.3*  MCV 83.8 78.4* 78.3* 81.1 81.0 83.1  --  84.8  PLT 343 317 380 288 231 190  --  161     Cardiac Enzymes: No results for input(s): CKTOTAL, CKMB, CKMBINDEX, TROPONINI in the last 168 hours.  BNP: Invalid input(s): POCBNP  CBG: Recent Labs  Lab 07/02/21 1547 07/02/21 1906 07/03/21 0008 07/03/21 0442 07/03/21 0724  GLUCAP 163* 169* 237* 233* 210*     Microbiology: Results for orders placed or performed during the hospital encounter of 06/27/21  Resp Panel by RT-PCR (Flu A&B, Covid) Nasopharyngeal Swab     Status: None   Collection Time: 06/27/21 12:32 AM   Specimen: Nasopharyngeal Swab; Nasopharyngeal(NP) swabs in vial transport medium  Result Value Ref Range Status   SARS Coronavirus 2 by RT PCR NEGATIVE NEGATIVE Final    Comment: (NOTE) SARS-CoV-2 target nucleic acids are NOT  DETECTED.  The SARS-CoV-2 RNA is generally detectable in upper respiratory specimens during the acute phase of infection. The lowest concentration of SARS-CoV-2 viral copies this assay can detect is 138 copies/mL. A negative result does not preclude SARS-Cov-2 infection and should not be used as the sole basis for treatment or other patient management decisions. A negative result may occur with  improper specimen collection/handling, submission of specimen other than nasopharyngeal swab, presence of viral mutation(s) within the areas targeted by this assay, and inadequate number of viral copies(<138 copies/mL). A negative result must be combined with clinical observations, patient history, and epidemiological information. The expected result is Negative.  Fact Sheet for Patients:  EntrepreneurPulse.com.au  Fact Sheet for Healthcare Providers:  IncredibleEmployment.be  This test is no t yet approved or cleared by the [place] FDA and  has been authorized for detection and/or diagnosis of SARS-CoV-2 by FDA under an Emergency Use Authorization (EUA). This EUA will remain  in effect (meaning this test can be used) for the duration of the COVID-19 declaration under Section 564(b)(1) of the Act, 21 U.S.C.section 360bbb-3(b)(1), unless the authorization is terminated  or revoked sooner.       Influenza A by PCR NEGATIVE NEGATIVE Final   Influenza B by PCR NEGATIVE NEGATIVE Final    Comment: (NOTE) The Xpert Xpress SARS-CoV-2/FLU/RSV plus assay is intended as an aid in the diagnosis of influenza from Nasopharyngeal swab specimens and should not be used as a sole basis for treatment. Nasal washings and aspirates are unacceptable for Xpert Xpress SARS-CoV-2/FLU/RSV testing.  Fact Sheet for Patients: EntrepreneurPulse.com.au  Fact Sheet for Healthcare Providers: IncredibleEmployment.be  This test is not yet  approved or cleared by the [place] FDA and has been authorized for detection and/or diagnosis of SARS-CoV-2 by FDA under an Emergency Use Authorization (EUA). This EUA will remain in effect (meaning this test can be used) for the duration of the COVID-19 declaration under Section 564(b)(1) of the Act, 21 U.S.C. section 360bbb-3(b)(1), unless the authorization is terminated or revoked.  Performed at [facility], [address] [place], [place] [address]   Blood culture (routine x 2)     Status: None   Collection Time: 06/27/21 12:43 AM   Specimen: BLOOD  Result Value Ref Range Status   Specimen Description BLOOD RIGHT ASSIST CONTROL  Final   Special Requests   Final    BOTTLES DRAWN AEROBIC AND ANAEROBIC Blood Culture results may not be optimal due to an inadequate volume of blood received in culture bottles   Culture   Final    NO GROWTH 5 DAYS Performed at [facility], [address], [place], [place] [address]    Report Status 07/02/2021 FINAL  Final  Blood culture (routine x 2)     Status: None   Collection Time: 06/27/21  1:45 AM   Specimen: BLOOD  Result Value Ref Range Status   Specimen Description BLOOD RIGHT ASSIST CONTROL  Final   Special Requests   Final    BOTTLES DRAWN AEROBIC AND ANAEROBIC Blood Culture adequate volume   Culture   Final    NO GROWTH 5 DAYS Performed at [facility], [address] [place], [place] [address]    Report Status 07/02/2021 FINAL  Final  Urine Culture     Status: Abnormal   Collection Time: 06/27/21  2:39 AM   Specimen: In/Out Cath Urine  Result Value Ref Range Status   Specimen Description   Final    IN/OUT CATH URINE Performed at [facility], [address] [place], [place] [address]    Special Requests   Final    NONE Performed at [facility], [place], [place] [address]    Culture 30,000 COLONIES/mL YEAST (A)  Final   Report Status 06/28/2021 FINAL   Final  MRSA Next Gen by PCR, Nasal     Status: None   Collection Time: 06/30/21  5:01 AM   Specimen: Nasal Mucosa; Nasal Swab  Result Value Ref Range Status   MRSA by PCR Next Gen NOT DETECTED NOT DETECTED Final    Comment: (NOTE) The GeneXpert MRSA Assay (FDA approved for NASAL specimens only), is one component of a comprehensive MRSA colonization surveillance program. It is not intended to diagnose MRSA infection nor to guide or monitor treatment for MRSA infections. Test performance is not FDA approved in patients less than 74 years old. Performed at [facility], [address] [place], [place] [address]     Coagulation Studies: No results for input(s): LABPROT, INR in the last 72 hours.   Urinalysis: No results for input(s): COLORURINE, LABSPEC, PHURINE, GLUCOSEU, HGBUR, BILIRUBINUR, KETONESUR, PROTEINUR, UROBILINOGEN, NITRITE, LEUKOCYTESUR in the last 72 hours.  Invalid input(s): APPERANCEUR     Imaging: US Venous Img Lower Bilateral (DVT)  Result Date: 07/02/2021 CLINICAL DATA:  Bilateral lower extremity swelling. EXAM: BILATERAL LOWER EXTREMITY VENOUS DOPPLER ULTRASOUND TECHNIQUE: Gray-scale sonography with compression, as well as color and duplex ultrasound, were performed to evaluate the deep venous system(s) from the level of the common femoral vein through the popliteal and proximal calf veins. COMPARISON:  None. FINDINGS: VENOUS Normal compressibility of the common femoral, superficial femoral, and popliteal veins, as well as the visualized calf veins. Visualized portions of profunda femoral vein and great saphenous vein unremarkable. No filling defects to suggest DVT on grayscale or color Doppler imaging. Doppler waveforms show normal direction of venous flow, normal respiratory plasticity and response to augmentation. Limited views of the contralateral common femoral vein are unremarkable. OTHER Bilateral  popliteal fossa cysts. On the right, this measures 5.9  x 3.2 x 1.7 cm. On the left this measures 3.9 x 2.1 x 2.0 cm. Limitations: none IMPRESSION: No evidence of lower extremity DVT. Bilateral Baker's cysts. Electronically Signed   By: Rolm Baptise M.D.   On: 07/02/2021 23:37   DG Chest Port 1 View  Result Date: 07/02/2021 CLINICAL DATA:  75 year old female with pleural effusion. EXAM: PORTABLE CHEST - 1 VIEW COMPARISON:  06/30/2021 FINDINGS: The mediastinal contours are within normal limits. Unchanged cardiomegaly. Endotracheal tube in place with the tip in the midthoracic trachea. Gastric decompression tube courses off the inferior aspect of this image. Unchanged position of the previously visualized bilateral central venous catheters. Low lung volumes. Similar appearing cicatricial atelectasis in the right lung base mild blunting of the costophrenic angle. Similar appearing retrocardiac opacity and blunting the left costophrenic angle. No pneumothorax or new focal consolidation. No acute osseous abnormality. IMPRESSION: 1. Slight interval increase in previously visualized left pleural effusion which is small. There is associated left basilar atelectasis. 2. Similar appearing right basilar subsegmental atelectasis and possible trace right pleural effusion. 3. Unchanged in appropriate appearing positions of indwelling support lines and tubes. Electronically Signed   By: Ruthann Cancer MD   On: 07/02/2021 15:18     Medications:    sodium chloride     dexmedetomidine (PRECEDEX) IV infusion Stopped (07/02/21 1301)   fentaNYL infusion INTRAVENOUS 50 mcg/hr (07/03/21 0615)   fluconazole (DIFLUCAN) IV Stopped (07/02/21 1834)   heparin 1,200 Units/hr (07/03/21 0615)   piperacillin-tazobactam (ZOSYN)  IV Stopped (07/03/21 0553)   TPN ADULT (ION) 65 mL/hr at 07/03/21 0615    chlorhexidine gluconate (MEDLINE KIT)  15 mL Mouth Rinse BID   Chlorhexidine Gluconate Cloth  6 each Topical Q0600   insulin aspart  0-20 Units Subcutaneous Q4H   insulin aspart  5 Units  Subcutaneous Q4H   insulin detemir  20 Units Subcutaneous BID   mouth rinse  15 mL Mouth Rinse 10 times per day   pantoprazole (PROTONIX) IV  40 mg Intravenous Q12H     Assessment/ Plan:  Heather Lopez is a 75 y.o.  female who has been admitted for community acquired pneumonia.    Acute Kidney Injury on chronic kidney disease stage 4 with baseline creatinine 1.97 and GFR of 25 on 12/22/19.  Acute kidney injury secondary to sepsis and obstructive uropathy Chronic kidney disease is secondary to diabetic nephropathy Continue holding losartan and metformin  - UOP and Creatinine are improving; may not need further HD but will assess daily  Lab Results  Component Value Date   CREATININE 3.85 (H) 07/03/2021   CREATININE 4.01 (H) 07/02/2021   CREATININE 5.40 (H) 07/01/2021    Intake/Output Summary (Last 24 hours) at 07/03/2021 0904 Last data filed at 07/03/2021 0615 Gross per 24 hour  Intake 1845.67 ml  Output 1060 ml  Net 785.67 ml    2. Acute Respiratory failure secondary to community acquired pneumonia failing outpatient antibiotics.  Requiring mechanical ventilation.   3. Anemia of chronic kidney disease Normocytic Lab Results  Component Value Date   HGB 7.4 (L) 07/03/2021   Low threshold to initiate ESA Dose given 07/01/21  4.  Diabetes mellitus type II with chronic kidney disease insulin dependent. Most recent hemoglobin A1c is 8.3 on 06/12/21.  Metformin held    5. Nephrolithiasis- with obstructive uropathy and symptoms consistent with UTI. Urine culture positive for 30,000 colonies of yeast.  Followed by Urology  outpatient with urethral stent placement in past  6. S/p  duodenal perforation s/p repair on 06/30/2021 Getting TPN     LOS: 6 Darothy Courtright 8/8/20229:04 AM

## 2021-07-03 NOTE — Progress Notes (Signed)
NAME:  Heather Lopez, MRN:  [id number], DOB:  1945/12/10, LOS: 6 ADMISSION DATE:  06/27/2021, CONSULTATION DATE:  06/30/21 REFERRING MD:  Hampton Abbot, MD  CHIEF COMPLAINT:  Abdominal Pain   HPI  75 y.o with significant PMH as below who presented to the ED on 06/27/21 with chief complaints of progressive shortness of breath, productive cough, loss of appetite, nausea, dry heaving fatigue, malaise and generalized body aches. Patient initially went to her PCP on 7/29 with complaints of rhinorrhea, harsh cough, loss of appetite, nausea, fatigue, malaise and generalized body aches x2 days, patient was diagnosed with pneumonia of which she received ceftriaxone 1 mg IM and sent home with Levaquin, prednisone and Tessalon.  Per ED reports, patient reported progressive worsening of her symptoms during ED visit.   Past Medical History  Diabetes mellitus type 2 Anxiety and depression Hypertension Hyperlipidemia Hydronephrosis and recurrent UTI Gout and osteoarthritis Significant Hospital Events   8/2: Admitted to MedSurg unit with sepsis 8/3: Vascular consulted for HD catheter 8/4: Patient developed acute abdomen.  CT abdomen shows viscus perforation.  Patient taken to the OR urgently for exploratory lap.  Patient return to ICU on vent.  PCCM consulted 07/01/21- patient had SBT today for recruitment and chest physiotherapy.  Still has significant tachycardia and hypertension when awake (possible pain related) but mentation is good able to follow verbal communication.  07/02/21-patient has failed SBT today due to hypoxemia.  Reviewed care plan with surgery team.  07/03/21- Lung V/Q scan obtained ~ negative for PE.  Weaning FiO2, currently at 50%  Consults:  Nephrology Vascular PCCM General Surgery  Procedures:  8/5: Exploratory Lap   Significant Diagnostic Tests:  8/2: Chest Xray>left basilar consolidation or atelectasis and suspected small left pleural effusion 8/2: Renal ultrasound> moderate right  hydronephrosis, mild left hydronephrosis new with bilateral nephrolithiasis 8/4: CTA abdomen and pelvis> Extensive pneumoperitoneum compatible with perforated viscus. Gastric perforation is suspected, though bowel wall defect is not definitively identified. Surgical consultation is recommended. 2. Large ill-defined mass arising from the upper pole left kidney consistent with renal cell carcinoma. Dedicated renal MRI may be useful when clinical situation permits. 3. Acute left-sided hydronephrosis due to a 12 mm left UPJ calculus. 4. Chronic right hydronephrosis and hydroureter without clear etiology. 5. Bilateral nonobstructing renal calculi as above. 6. Small volume ascites. 7. Trace bilateral pleural effusions with patchy bibasilar atelectasis. 8/7: Venous US BLE>> negative for DVT 8/8: Lung V/Q Scan>>Negative for pulmonary embolus.  Micro Data:  8/2: SARS-CoV-2 PCR> negative 8/2: Influenza PCR> negative 8/2: Blood culture x2> no growth 8/2: Urine Culture> 30,000 colonies yeast 8/2: Legionella urinary antigen> negative  Antimicrobials:  8/4 Fluconazole> 8/4 Zosyn>  OBJECTIVE  Blood pressure (!) 171/67, pulse (!) 105, temperature 100.04 F (37.8 C), resp. rate 16, height 5' 2.01" (1.575 m), weight 102.9 kg, SpO2 95 %.    Vent Mode: PRVC FiO2 (%):  [35 %-50 %] 50 % Set Rate:  [14 bmp] 14 bmp Vt Set:  [500 mL] 500 mL PEEP:  [5 cmH20] 5 cmH20   Intake/Output Summary (Last 24 hours) at 07/03/2021 4098 Last data filed at 07/03/2021 0615 Gross per 24 hour  Intake 1939.01 ml  Output 1060 ml  Net 879.01 ml    Filed Weights   06/27/21 0028 06/28/21 0100 06/30/21 0345  Weight: 99.8 kg 106.8 kg 102.9 kg   Physical Examination  GENERAL: 75 year-old acutely ill patient lying in the bed intubated, mechanically ventilated and sedated, in NAD EYES: Pupils equal,  round, reactive to light and accommodation. No scleral icterus.  HEENT: Head atraumatic, normocephalic. Orally  intubated. NECK:  Supple, no jugular venous distention. No thyroid enlargement, no tenderness.  LUNGS: Mechanical breath sounds bilaterally, no wheezing, rales,rhonchi or crepitation. No use of accessory muscles of respiration. Synchronous with the vent, event CARDIOVASCULAR: Tachycardia, regular rhythm, S1, S2. No murmurs, rubs, or gallops.  ABDOMEN: Distended, Midline incision with honey comb dressing dry and intact. JP Drains x 2 EXTREMITIES: No pedal edema, cyanosis, or clubbing.  NEUROLOGIC: Lightly sedated, arouses to voice and follow commands (moves all extremities to command), no focal deficits noted PSYCHIATRIC: The patient is intubated and sedated SKIN: Rash under breast and abdominal folds. No OBVIOUS lesion  Labs/imaging that I havepersonally reviewed  (right click and "Reselect all SmartList Selections" daily)     Labs   CBC: Recent Labs  Lab 06/27/21 0713 06/28/21 0458 06/29/21 0601 06/30/21 0320 07/01/21 0508 07/02/21 0508 07/02/21 2245 07/03/21 0509  WBC 19.2* 20.2* 20.8* 30.3* 30.3* 23.2*  --  28.9*  NEUTROABS 17.9* 18.3*  --  27.9* 28.1*  --   --  26.6*  HGB 8.9* 8.8* 9.5* 10.0* 7.8* 7.5* 7.5* 7.4*  HCT 26.4* 25.7* 27.4* 28.8* 23.0* 22.1* 23.2* 22.3*  MCV 83.8 78.4* 78.3* 81.1 81.0 83.1  --  84.8  PLT 343 317 380 288 231 190  --  161     Basic Metabolic Panel: Recent Labs  Lab 06/27/21 0911 06/27/21 1459 06/27/21 2231 06/28/21 0458 06/29/21 0601 06/30/21 0250 06/30/21 0320 07/01/21 0508 07/02/21 0508 07/03/21 0509  NA  --    < > 129*   < > 130*  --  130* 135 134* 141  K  --    < > 5.0   < > 3.8  --  4.4 4.5 4.1 3.8  CL  --    < > 96*   < > 95*  --  100 101 102 107  CO2  --    < > 14*   < > 18*  --  19* _0 GLUCOSE  --    < > 261*   < > 240*  --  157* 320* 360* 234*  BUN  --    < > 170*   < > 152*  --  141* 110* 91* 98*  CREATININE  --    < > 9.14*   < > 6.68*  --  6.20* 5.40* 4.01* 3.85*  CALCIUM  --    < > 7.7*   < > 8.1*  --  7.7* 8.6*  8.3* 8.4*  MG 1.7  --   --   --   --   --  1.7 2.3 2.2 2.0  PHOS  --    < > 9.1*  --   --  8.2*  --  8.8* 5.1* 2.9   < > = values in this interval not displayed.    GFR: Estimated Creatinine Clearance: 14.4 mL/min (A) (by C-G formula based on SCr of 3.85 mg/dL (H)). Recent Labs  Lab 06/27/21 0032 06/27/21 0239 06/27/21 0508 06/27/21 0713 06/28/21 0458 06/29/21 0601 06/30/21 0320 07/01/21 0508 07/02/21 0508 07/03/21 0509  PROCALCITON 6.83  --   --   --  5.48 3.18  --   --   --   --   WBC 24.4*  --  19.4* 19.2* 20.2* 20.8* 30.3* 30.3* 23.2* 28.9*  LATICACIDVEN 5.7* 6.3* 5.4* 4.8*  --   --  1.3  --   --   --  Liver Function Tests: Recent Labs  Lab 06/27/21 0713 06/27/21 1459 06/27/21 2231 06/28/21 0458 07/01/21 0508 07/03/21 0509  AST 20  --   --  13* 43* 38  ALT 18  --   --  16 44 34  ALKPHOS 56  --   --  62 61 77  BILITOT 0.5  --   --  0.5 1.1 0.4  PROT 6.4*  --   --  6.1* 5.7* 5.7*  ALBUMIN 2.6* 2.7* 2.6* 2.5* 1.9* 1.6*    No results for input(s): LIPASE, AMYLASE in the last 168 hours. No results for input(s): AMMONIA in the last 168 hours.  ABG    Component Value Date/Time   PHART 7.32 (L) 06/30/2021 2200   PCO2ART 37 06/30/2021 2200   PO2ART 104 06/30/2021 2200   HCO3 19.1 (L) 06/30/2021 2200   ACIDBASEDEF 6.4 (H) 06/30/2021 2200   O2SAT 97.5 06/30/2021 2200      Coagulation Profile: Recent Labs  Lab 06/27/21 0032  INR 1.4*    Cardiac Enzymes: No results for input(s): CKTOTAL, CKMB, CKMBINDEX, TROPONINI in the last 168 hours.  CBG: Recent Labs  Lab 07/02/21 1547 07/02/21 1906 07/03/21 0008 07/03/21 0442 07/03/21 0724  GLUCAP 163* 169* 237* 233* 210*     Review of Systems:   UNABLE TO OBTAIN DUE TO PATIENT ON THE VENT AND SEDATED  Past Medical History  She,  has a past medical history of Anxiety, Cataract, Depression, Diabetes mellitus without complication ([place]), Gout, History of kidney stones, Hyperlipidemia, Hypertension, and  Vertigo.   Surgical History    Past Surgical History:  Procedure Laterality Date   CATARACT EXTRACTION, BILATERAL Bilateral 2019   COLONOSCOPY WITH PROPOFOL N/A 08/19/2015   Procedure: COLONOSCOPY WITH PROPOFOL;  Surgeon: Lucilla Lame, MD;  Location: [place];  Service: Endoscopy;  Laterality: N/A;  diabetic - insulin   CYSTOSCOPY W/ RETROGRADES Bilateral 12/22/2019   Procedure: CYSTOSCOPY WITH RETROGRADE PYELOGRAM;  Surgeon: Abbie Sons, MD;  Location: [facility];  Service: Urology;  Laterality: Bilateral;   CYSTOSCOPY WITH BIOPSY N/A 12/22/2019   Procedure: CYSTOSCOPY WITH bladder BIOPSY;  Surgeon: Abbie Sons, MD;  Location: [facility];  Service: Urology;  Laterality: N/A;   CYSTOSCOPY WITH STENT PLACEMENT Right 12/22/2019   Procedure: CYSTOSCOPY WITH STENT PLACEMENT;  Surgeon: Abbie Sons, MD;  Location: [facility];  Service: Urology;  Laterality: Right;   CYSTOSCOPY WITH URETEROSCOPY Bilateral 12/22/2019   Procedure: CYSTOSCOPY WITH URETEROSCOPY;  Surgeon: Abbie Sons, MD;  Location: [facility];  Service: Urology;  Laterality: Bilateral;   CYSTOSCOPY/URETEROSCOPY/HOLMIUM LASER/STENT PLACEMENT Right 12/22/2019   Procedure: CYSTOSCOPY/URETEROSCOPY/HOLMIUM LASER/STENT PLACEMENT;  Surgeon: Abbie Sons, MD;  Location: [facility];  Service: Urology;  Laterality: Right;   DIALYSIS/PERMA CATHETER INSERTION N/A 06/29/2021   Procedure: DIALYSIS/PERMA CATHETER INSERTION;  Surgeon: Algernon Huxley, MD;  Location: [place] [facility];  Service: Cardiovascular;  Laterality: N/A;   EYE SURGERY     LAPAROTOMY N/A 06/29/2021   Procedure: EXPLORATORY LAPAROTOMY WITH REPAIR OF DUODENAL PERFORATION;  Surgeon: [name], MD;  Location: [facility];  Service: General;  Laterality: N/A;   POLYPECTOMY  08/19/2015   Procedure: POLYPECTOMY;  Surgeon: Lucilla Lame, MD;  Location: [place];  Service: Endoscopy;;   TUBAL LIGATION  1992     Social History   reports that she quit smoking  about 49 years ago. Her smoking use included cigarettes. She has a 4.50 pack-year smoking history. She has never used smokeless tobacco.  She reports that she does not drink alcohol and does not use drugs.   Family History   Her family history includes Cancer in her father; Diabetes in her brother and father; Stroke in her mother.   Allergies Allergies  Allergen Reactions   Contrast Media  [Iodinated Diagnostic Agents] Anaphylaxis   Iodine Swelling    (IV only) - angioedema     Home Medications  Prior to Admission medications   Medication Sig Start Date End Date Taking? Authorizing Provider  amLODipine (NORVASC) 10 MG tablet Take 1 tablet (10 mg total) by mouth daily. 04/15/18  Yes Mikey College, NP  aspirin EC 81 MG tablet Take 81 mg by mouth daily.   Yes [provider]  atorvastatin (LIPITOR) 20 MG tablet Take 1 tablet (20 mg total) by mouth daily. 04/15/18  Yes Mikey College, NP  benzonatate (TESSALON) 200 MG capsule Take 200 mg by mouth 3 (three) times daily as needed. 06/23/21  Yes [provider]  buPROPion (WELLBUTRIN XL) 150 MG 24 hr tablet Take 1 tablet (150 mg total) by mouth daily. 05/26/18  Yes Mikey College, NP  insulin aspart (NOVOLOG FLEXPEN) 100 UNIT/ML FlexPen Use meal coverage 6 units with every meal plus correction scale three times daily up to total of 35 units per day. 04/15/18  Yes Mikey College, NP  Insulin Pen Needle 32G X 4 MM MISC Inject insulin pens up to 5 times daily 04/15/18  Yes Mikey College, NP  Lancets (FREESTYLE) lancets USE AS INSTRUCTED 06/16/19  Yes Karamalegos, Alexander J, DO  LEVEMIR FLEXTOUCH 100 UNIT/ML Pen INJECT 50 UNITS IN THE MORNING AND 44 UNITS IN THE EVENING Patient taking differently: Inject 52 Units into the skin 2 (two) times daily. 06/30/18  Yes Mikey College, NP  levofloxacin (LEVAQUIN) 500 MG tablet Take 500 mg by mouth daily. 06/23/21  Yes [provider]  losartan  (COZAAR) 50 MG tablet Take 1 tablet (50 mg total) by mouth daily. 04/15/18  Yes Mikey College, NP  metFORMIN (GLUCOPHAGE) 1000 MG tablet Take 1 tablet (1,000 mg total) by mouth 2 (two) times daily. 04/15/18  Yes Mikey College, NP  metoprolol tartrate (LOPRESSOR) 25 MG tablet Take 1 tablet (25 mg total) by mouth 2 (two) times daily. 05/26/18  Yes Mikey College, NP  Multiple Vitamin (MULTI-VITAMIN) tablet Take 1 tablet by mouth daily.    Yes [provider]  ondansetron (ZOFRAN-ODT) 4 MG disintegrating tablet Take 4 mg by mouth every 8 (eight) hours as needed. 06/23/21  Yes [provider]  predniSONE (DELTASONE) 20 MG tablet Take 20 mg by mouth 2 (two) times daily. 06/23/21  Yes [provider]  TRULICITY 0.93 GH/8.2XH SOPN SMARTSIG:0.5 Milliliter(s) SUB-Q Once a Week 06/19/21  Yes [provider]  Scheduled Meds:  chlorhexidine gluconate (MEDLINE KIT)  15 mL Mouth Rinse BID   Chlorhexidine Gluconate Cloth  6 each Topical Q0600   insulin aspart  0-20 Units Subcutaneous Q4H   insulin aspart  5 Units Subcutaneous Q4H   insulin detemir  20 Units Subcutaneous BID   mouth rinse  15 mL Mouth Rinse 10 times per day   pantoprazole (PROTONIX) IV  40 mg Intravenous Q12H   Continuous Infusions:  sodium chloride     dexmedetomidine (PRECEDEX) IV infusion Stopped (07/02/21 1301)   fentaNYL infusion INTRAVENOUS 50 mcg/hr (07/03/21 0615)   fluconazole (DIFLUCAN) IV Stopped (07/02/21 1834)   heparin 1,200 Units/hr (07/03/21 0615)   piperacillin-tazobactam (  ZOSYN)  IV Stopped (07/03/21 0553)   TPN ADULT (ION) 65 mL/hr at 07/03/21 0615   PRN Meds:.sodium chloride, acetaminophen, alteplase, fentaNYL, heparin, labetalol, lidocaine (PF), lidocaine-prilocaine, ondansetron (ZOFRAN) IV, pentafluoroprop-tetrafluoroeth      Active Hospital Problem list   Acute hypoxic respiratory failure Severe sepsis with septic shock Bowel perforation AKI on CKD stage  IV Right UPJ stone with hydronephrosis Anion gap metabolic acidosis Lactic acidosis Diabetes mellitus  Assessment & Plan:  Acute Hypoxic Respiratory Failure in setting of Community Acquired Pneumonia,  bilateral pleural effusions with associated atelectasis -Full vent support, implement lung protective strategies -Wean FiO2 & PEEP as tolerated to maintain O2 sats >92% -Follow intermittent Chest X-ray & ABG as needed -Spontaneous Breathing Trials when respiratory parameters met and mental status permits -Implement VAP Bundle -Prn Bronchodilators -Continue Zosyn (previously on Ceftriaxone and Azithromycin)  Severe Sepsis secondary to UTI, Community Acquired Pneumonia and now with perforated Duodenal Ulcer Lactic: 5.7>6.3>5.4>4.8, Baseline PCT: 6.83, UA: + UTI 30,000 colonies/yeast, CXR: Left lower lobe pneumonia, CT: Chest perforation -Monitor fever curve -Trend WBC's & Procalcitonin -Follow cultures as above -Continue Zosyn & Fluconazole pending cultures & sensitivities -General Surgery following, appreciate input, will follow recommendations -Keep NPO x5 days post procedure -NGT to LIS -S/p exploratory laparotomy and primary repair of duodenal ulcer with omental patch on 06/29/21  Septic Shock>>resolved Hypertension -Continuous cardiac monitoring -Maintain MAP >65 -IV fluids -Prn Labetalol -Consider Esmolol gtt  Acute kidney injury on CKD stage IV (baseline creatinine 1.97 and GFR of 25 on 12/22/19.) Right UPJ stone with hydronephrosis Suspected renal cell carcinoma, will need further eval/biopsy -Monitor I&O's / urinary output -Follow BMP -Ensure adequate renal perfusion -Avoid nephrotoxic agents as able -Replace electrolytes as indicated -Nephrology following, appreciate input -Followed by Urology outpatient with urethral stent placement in past  Anemia without overt s/sx of bleeding -Monitor for S/Sx of bleeding -Trend CBC -Was placed on Heparin gtt empirically for  suspected PE, but scan negative for PE, will transition back to Heparin SQ for VTE Prophylaxis  -Transfuse for Hgb <7  Diabetes mellitus -CBGs -Sliding scale insulin -Follow ICU hyper/hypoglycemia protocol -Hold home Meds   Pt is critically ill with multiorgan failure.  Prognosis is extremely guarded.  High risk for cardiac arrest and death.  Best practice:  Diet:  NPO, TPN Pain/Anxiety/Delirium protocol (if indicated): Yes (RASS goal 0) VAP protocol (if indicated): Yes DVT prophylaxis: Heparin gtt GI prophylaxis: PPI Glucose control:  SSI Yes Central venous access:  Yes, and it is still needed Arterial line:  Yes, and it is still needed Foley:  Yes, and it is still needed Mobility:  bed rest  PT consulted: N/A Last date of multidisciplinary goals of care discussion [8/8] Code Status:  full code Disposition: ICU  Updated pt's spouse Heather Lopez at bedside 07/03/21.  All questions answered.     Critical care provider statement:  Total critical care time: 40 minutes    Darel Hong, AGACNP-BC [facility] Prefer epic messenger for cross cover needs If after hours, please call E-link    .

## 2021-07-03 NOTE — Consult Note (Signed)
ANTICOAGULATION CONSULT NOTE  Pharmacy Consult for Heparin infusion Indication: pulmonary embolus  Patient Measurements: Heparin Dosing Weight: 74.6 kg   Labs: Recent Labs    07/01/21 0508 07/02/21 0508 07/02/21 2245 07/03/21 0509  HGB 7.8* 7.5* 7.5* 7.4*  HCT 23.0* 22.1* 23.2* 22.3*  PLT 231 190  --  161  CREATININE 5.40* 4.01*  --  3.85*     Estimated Creatinine Clearance: 14.4 mL/min (A) (by C-G formula based on SCr of 3.85 mg/dL (H)).   Medical History: Past Medical History:  Diagnosis Date   Anxiety    Cataract    Depression    Diabetes mellitus without complication ([place])    Gout    History of kidney stones    Hyperlipidemia    Hypertension    Vertigo     Assessment:  [name] admitted 8/2 with acute renal failure, acute respiratory failure secondary to pneumonia. Hospital course complicated by perforated duodenal ulcer and patient was emergently taken to OR 8/5 for ExLap, repair of perforated duodenal ulcer.  8/7: concern for PE -- but unable to perform CT due to contrast allergy. Pharmacy has been consulted for initiation and management of heparin infusion for PE.  Date / time HL Interpretation / rate 8/8 0724 0.5 Thera x 1, 1200 units/hr  Goal of Therapy:  Heparin level 0.3-0.7 units/ml Monitor platelets by anticoagulation protocol: Yes   Plan:  --Heparin level is therapeutic x 1 --Continue heparin infusion at 1200 units/hr --Re-check confirmatory HL in 8 hours --Daily CBC per protocol  Benita Gutter 07/03/2021,7:47 AM

## 2021-07-03 NOTE — Progress Notes (Signed)
Esmolol gtt has completed and an additional dose ordered from pharmacy. Medication has been off for >53min. Pt BP has remained WDL during that time. Will restart med if pt SBP reaches >180.   07/03/21 2000  Vitals  Temp (!) 101.5 F (38.6 C)  Temp Source Rectal  BP (!) 153/58  MAP (mmHg) 85  BP Location Left Arm  BP Method Automatic  Patient Position (if appropriate) Lying  Pulse Rate 88  Pulse Rate Source Monitor  ECG Heart Rate 88  Resp 13  Oxygen Therapy  SpO2 97 %  O2 Device Ventilator  Patient Activity (if Appropriate) In bed  Pulse Oximetry Type Continuous  Art Line  Arterial Line BP 175/58  Arterial Line MAP (mmHg) 71 mmHg  Arterial Line Location Left radial  Art Line Wave Form Appropriate wave forms  Pain Assessment  Pain Scale CPOT  Critical Care Pain Observation Tool (CPOT)  Facial Expression 0  Body Movements 0  Muscle Tension 0  Compliance with ventilator (intubated pts.) N/A  Vocalization (extubated pts.) 0  CPOT Total 0  Glasgow Coma Scale  Eye Opening 2  Modified Verbal Response (INTUBATED) 1  Best Motor Response 4  Glasgow Coma Scale Score 7  MEWS Score  MEWS Temp 2  MEWS Systolic 0  MEWS Pulse 0  MEWS RR 1  MEWS LOC 1  MEWS Score 4  MEWS Score Color Red

## 2021-07-03 NOTE — Progress Notes (Signed)
No changes from 0000 assessments.

## 2021-07-04 ENCOUNTER — Inpatient Hospital Stay: Payer: Medicare Other

## 2021-07-04 DIAGNOSIS — J9601 Acute respiratory failure with hypoxia: Secondary | ICD-10-CM | POA: Diagnosis not present

## 2021-07-04 DIAGNOSIS — N170 Acute kidney failure with tubular necrosis: Secondary | ICD-10-CM | POA: Diagnosis not present

## 2021-07-04 LAB — BASIC METABOLIC PANEL
Anion gap: 10 (ref 5–15)
BUN: 93 mg/dL — ABNORMAL HIGH (ref 8–23)
CO2: 25 mmol/L (ref 22–32)
Calcium: 8.6 mg/dL — ABNORMAL LOW (ref 8.9–10.3)
Chloride: 110 mmol/L (ref 98–111)
Creatinine, Ser: 3.67 mg/dL — ABNORMAL HIGH (ref 0.44–1.00)
GFR, Estimated: 12 mL/min — ABNORMAL LOW (ref >60)
Glucose, Bld: 209 mg/dL — ABNORMAL HIGH (ref 70–99)
Potassium: 4 mmol/L (ref 3.5–5.1)
Sodium: 145 mmol/L (ref 135–145)

## 2021-07-04 LAB — GLUCOSE, CAPILLARY
Glucose-Capillary: 196 mg/dL — ABNORMAL HIGH (ref 70–99)
Glucose-Capillary: 197 mg/dL — ABNORMAL HIGH (ref 70–99)
Glucose-Capillary: 221 mg/dL — ABNORMAL HIGH (ref 70–99)
Glucose-Capillary: 232 mg/dL — ABNORMAL HIGH (ref 70–99)
Glucose-Capillary: 252 mg/dL — ABNORMAL HIGH (ref 70–99)
Glucose-Capillary: 266 mg/dL — ABNORMAL HIGH (ref 70–99)

## 2021-07-04 LAB — CBC
HCT: 22.6 % — ABNORMAL LOW (ref 36.0–46.0)
Hemoglobin: 7.3 g/dL — ABNORMAL LOW (ref 12.0–15.0)
MCH: 28.2 pg (ref 26.0–34.0)
MCHC: 32.3 g/dL (ref 30.0–36.0)
MCV: 87.3 fL (ref 80.0–100.0)
Platelets: 145 10*3/uL — ABNORMAL LOW (ref 150–400)
RBC: 2.59 MIL/uL — ABNORMAL LOW (ref 3.87–5.11)
RDW: 15.6 % — ABNORMAL HIGH (ref 11.5–15.5)
WBC: 24.4 10*3/uL — ABNORMAL HIGH (ref 4.0–10.5)
nRBC: 0 % (ref 0.0–0.2)

## 2021-07-04 LAB — PHOSPHORUS: Phosphorus: 3 mg/dL (ref 2.5–4.6)

## 2021-07-04 LAB — MAGNESIUM: Magnesium: 1.9 mg/dL (ref 1.7–2.4)

## 2021-07-04 MED ORDER — PIVOT 1.5 CAL PO LIQD
1000.0000 mL | ORAL | Status: DC
  Administered 2021-07-05: 1000 mL
  Filled 2021-07-04: qty 1000

## 2021-07-04 MED ORDER — FREE WATER
30.0000 mL | Status: DC
  Administered 2021-07-04 – 2021-07-07 (×15): 30 mL

## 2021-07-04 MED ORDER — MIDAZOLAM HCL 2 MG/2ML IJ SOLN
1.0000 mg | INTRAMUSCULAR | Status: DC | PRN
  Administered 2021-07-04 – 2021-07-07 (×7): 2 mg via INTRAVENOUS
  Administered 2021-07-07: 1 mg via INTRAVENOUS
  Administered 2021-07-08 – 2021-07-09 (×3): 2 mg via INTRAVENOUS
  Filled 2021-07-04 (×11): qty 2

## 2021-07-04 MED ORDER — LABETALOL HCL 5 MG/ML IV SOLN
10.0000 mg | INTRAVENOUS | Status: DC | PRN
  Administered 2021-07-05: 20 mg via INTRAVENOUS
  Filled 2021-07-04: qty 4

## 2021-07-04 MED ORDER — BARIUM SULFATE 2.1 % PO SUSP
450.0000 mL | ORAL | Status: AC
  Administered 2021-07-04 (×2): 450 mL via ORAL

## 2021-07-04 MED ORDER — TRAVASOL 10 % IV SOLN
INTRAVENOUS | Status: AC
  Filled 2021-07-04: qty 1014

## 2021-07-04 NOTE — Progress Notes (Signed)
Transported patient to and from CT without incident. 

## 2021-07-04 NOTE — Progress Notes (Signed)
[facility]  ROUNDING NOTE   Subjective:   Heather Lopez is a 75 y.o. white female who has been admitted for community acquired pneumonia.    Hospital course complicated by duodenal perforation s/p repair on 06/30/2021 ARF - requiring HD PRN Acute resp failure requiring mech ventilation  Patient remains critically ill Intubated, sedated- fentanyl,  Fio2 30% Getting TPN at 65 cc/hr NG tube with dark green aspirate  Objective:  Vital signs in last 24 hours:  Temp:  [99.5 F (37.5 C)-101.7 F (38.7 C)] 99.5 F (37.5 C) (08/09 0200) Pulse Rate:  [77-113] 81 (08/09 0500) Resp:  [13-22] 15 (08/09 0500) BP: (134-191)/(56-150) 154/60 (08/09 0500) SpO2:  [88 %-100 %] 92 % (08/09 0800) Arterial Line BP: (133-224)/(43-108) 166/58 (08/09 0500) FiO2 (%):  [30 %-45 %] 30 % (08/09 0800)  Weight change:  Filed Weights   06/27/21 0028 06/28/21 0100 06/30/21 0345  Weight: 99.8 kg 106.8 kg 102.9 kg    Intake/Output: I/O last 3 completed shifts: In: 3209.1 [I.V.:2851.9; IV Piggyback:357.1] Out: 3695 [NLGXQ:1194; Emesis/NG output:120; Drains:180]   Intake/Output this shift:  No intake/output data recorded.  Physical Exam: General: Critically ill  Head: ETT, OGT  Eyes: Anicteric  Lungs:  Vent assisted  Heart: Regular rate and rhythm  Abdomen:  +JP drains  Extremities:  trace peripheral edema.  Neurologic:  sedated  Skin: No lesions  Access:  RIJ temp cath  Left IJ PermCath-nonfunctional  Basic Metabolic Panel: Recent Labs  Lab 06/30/21 0250 06/30/21 0320 07/01/21 0508 07/02/21 0508 07/03/21 0509 07/04/21 0440  NA  --  130* 135 134* 141 145  K  --  4.4 4.5 4.1 3.8 4.0  CL  --  100 101 102 107 110  CO2  --  19* '22 28 25 25  ' GLUCOSE  --  157* 320* 360* 234* 209*  BUN  --  141* 110* 91* 98* 93*  CREATININE  --  6.20* 5.40* 4.01* 3.85* 3.67*  CALCIUM  --  7.7* 8.6* 8.3* 8.4* 8.6*  MG  --  1.7 2.3 2.2 2.0 1.9  PHOS 8.2*  --  8.8* 5.1* 2.9 3.0     Liver  Function Tests: Recent Labs  Lab 06/27/21 1459 06/27/21 2231 06/28/21 0458 07/01/21 0508 07/03/21 0509  AST  --   --  13* 43* 38  ALT  --   --  16 44 34  ALKPHOS  --   --  62 61 77  BILITOT  --   --  0.5 1.1 0.4  PROT  --   --  6.1* 5.7* 5.7*  ALBUMIN 2.7* 2.6* 2.5* 1.9* 1.6*    No results for input(s): LIPASE, AMYLASE in the last 168 hours. No results for input(s): AMMONIA in the last 168 hours.  CBC: Recent Labs  Lab 06/28/21 0458 06/29/21 0601 06/30/21 0320 07/01/21 0508 07/02/21 0508 07/02/21 2245 07/03/21 0509 07/04/21 0440  WBC 20.2*   < > 30.3* 30.3* 23.2*  --  28.9* 24.4*  NEUTROABS 18.3*  --  27.9* 28.1*  --   --  26.6*  --   HGB 8.8*   < > 10.0* 7.8* 7.5* 7.5* 7.4* 7.3*  HCT 25.7*   < > 28.8* 23.0* 22.1* 23.2* 22.3* 22.6*  MCV 78.4*   < > 81.1 81.0 83.1  --  84.8 87.3  PLT 317   < > 288 231 190  --  161 145*   < > = values in this interval not displayed.  Cardiac Enzymes: No results for input(s): CKTOTAL, CKMB, CKMBINDEX, TROPONINI in the last 168 hours.  BNP: Invalid input(s): POCBNP  CBG: Recent Labs  Lab 07/03/21 1923 07/03/21 1947 07/03/21 2316 07/04/21 0321 07/04/21 0800  GLUCAP 257* 221* 189* 197* 196*     Microbiology: Results for orders placed or performed during the hospital encounter of 06/27/21  Resp Panel by RT-PCR (Flu A&B, Covid) Nasopharyngeal Swab     Status: None   Collection Time: 06/27/21 12:32 AM   Specimen: Nasopharyngeal Swab; Nasopharyngeal(NP) swabs in vial transport medium  Result Value Ref Range Status   SARS Coronavirus 2 by RT PCR NEGATIVE NEGATIVE Final    Comment: (NOTE) SARS-CoV-2 target nucleic acids are NOT DETECTED.  The SARS-CoV-2 RNA is generally detectable in upper respiratory specimens during the acute phase of infection. The lowest concentration of SARS-CoV-2 viral copies this assay can detect is 138 copies/mL. A negative result does not preclude SARS-Cov-2 infection and should not be used as  the sole basis for treatment or other patient management decisions. A negative result may occur with  improper specimen collection/handling, submission of specimen other than nasopharyngeal swab, presence of viral mutation(s) within the areas targeted by this assay, and inadequate number of viral copies(<138 copies/mL). A negative result must be combined with clinical observations, patient history, and epidemiological information. The expected result is Negative.  Fact Sheet for Patients:  EntrepreneurPulse.com.au  Fact Sheet for Healthcare Providers:  IncredibleEmployment.be  This test is no t yet approved or cleared by the [place] FDA and  has been authorized for detection and/or diagnosis of SARS-CoV-2 by FDA under an Emergency Use Authorization (EUA). This EUA will remain  in effect (meaning this test can be used) for the duration of the COVID-19 declaration under Section 564(b)(1) of the Act, 21 U.S.C.section 360bbb-3(b)(1), unless the authorization is terminated  or revoked sooner.       Influenza A by PCR NEGATIVE NEGATIVE Final   Influenza B by PCR NEGATIVE NEGATIVE Final    Comment: (NOTE) The Xpert Xpress SARS-CoV-2/FLU/RSV plus assay is intended as an aid in the diagnosis of influenza from Nasopharyngeal swab specimens and should not be used as a sole basis for treatment. Nasal washings and aspirates are unacceptable for Xpert Xpress SARS-CoV-2/FLU/RSV testing.  Fact Sheet for Patients: EntrepreneurPulse.com.au  Fact Sheet for Healthcare Providers: IncredibleEmployment.be  This test is not yet approved or cleared by the [place] FDA and has been authorized for detection and/or diagnosis of SARS-CoV-2 by FDA under an Emergency Use Authorization (EUA). This EUA will remain in effect (meaning this test can be used) for the duration of the COVID-19 declaration under Section 564(b)(1) of  the Act, 21 U.S.C. section 360bbb-3(b)(1), unless the authorization is terminated or revoked.  Performed at [facility], [address] [place], [place] [address]   Blood culture (routine x 2)     Status: None   Collection Time: 06/27/21 12:43 AM   Specimen: BLOOD  Result Value Ref Range Status   Specimen Description BLOOD RIGHT ASSIST CONTROL  Final   Special Requests   Final    BOTTLES DRAWN AEROBIC AND ANAEROBIC Blood Culture results may not be optimal due to an inadequate volume of blood received in culture bottles   Culture   Final    NO GROWTH 5 DAYS Performed at [facility], [address], [place], [place] [address]    Report Status 07/02/2021 FINAL  Final  Blood culture (routine x 2)  Status: None   Collection Time: 06/27/21  1:45 AM   Specimen: BLOOD  Result Value Ref Range Status   Specimen Description BLOOD RIGHT ASSIST CONTROL  Final   Special Requests   Final    BOTTLES DRAWN AEROBIC AND ANAEROBIC Blood Culture adequate volume   Culture   Final    NO GROWTH 5 DAYS Performed at [facility], [address], [place], [place] [address]    Report Status 07/02/2021 FINAL  Final  Urine Culture     Status: Abnormal   Collection Time: 06/27/21  2:39 AM   Specimen: In/Out Cath Urine  Result Value Ref Range Status   Specimen Description   Final    IN/OUT CATH URINE Performed at [facility], [address] [place], [place] [address]    Special Requests   Final    NONE Performed at [facility], [address] [place], [place] [address]    Culture 30,000 COLONIES/mL YEAST (A)  Final   Report Status 06/28/2021 FINAL  Final  MRSA Next Gen by PCR, Nasal     Status: None   Collection Time: 06/30/21  5:01 AM   Specimen: Nasal Mucosa; Nasal Swab  Result Value Ref Range Status   MRSA by PCR Next Gen NOT DETECTED NOT DETECTED Final    Comment: (NOTE) The GeneXpert MRSA Assay (FDA approved for NASAL specimens  only), is one component of a comprehensive MRSA colonization surveillance program. It is not intended to diagnose MRSA infection nor to guide or monitor treatment for MRSA infections. Test performance is not FDA approved in patients less than 17 years old. Performed at [facility], [address] [place],  [address]     Coagulation Studies: No results for input(s): LABPROT, INR in the last 72 hours.   Urinalysis: No results for input(s): COLORURINE, LABSPEC, PHURINE, GLUCOSEU, HGBUR, BILIRUBINUR, KETONESUR, PROTEINUR, UROBILINOGEN, NITRITE, LEUKOCYTESUR in the last 72 hours.  Invalid input(s): APPERANCEUR     Imaging: NM Pulmonary Perfusion  Result Date: 07/03/2021 CLINICAL DATA:  Respiratory failure, recent diagnosis of pneumonia. Lower extremity Dopplers negative. EXAM: NUCLEAR MEDICINE PERFUSION LUNG SCAN TECHNIQUE: Perfusion images were obtained in multiple projections after intravenous injection of radiopharmaceutical. Ventilation scans intentionally deferred if perfusion scan and chest x-ray adequate for interpretation during COVID 19 epidemic. RADIOPHARMACEUTICALS:  3.85 mCi Tc-81mMAA IV COMPARISON:  Radiograph 07/02/2021, and previous FINDINGS: Physiologic distribution of radiopharmaceutical throughout both lungs. IMPRESSION: Negative for pulmonary embolus. Electronically Signed   By: DLucrezia EuropeM.D.   On: 07/03/2021 12:32   [place]Venous Img Lower Bilateral (DVT)  Result Date: 07/02/2021 CLINICAL DATA:  Bilateral lower extremity swelling. EXAM: BILATERAL LOWER EXTREMITY VENOUS DOPPLER ULTRASOUND TECHNIQUE: Gray-scale sonography with compression, as well as color and duplex ultrasound, were performed to evaluate the deep venous system(s) from the level of the common femoral vein through the popliteal and proximal calf veins. COMPARISON:  None. FINDINGS: VENOUS Normal compressibility of the common femoral, superficial femoral, and popliteal veins, as well as the  visualized calf veins. Visualized portions of profunda femoral vein and great saphenous vein unremarkable. No filling defects to suggest DVT on grayscale or color Doppler imaging. Doppler waveforms show normal direction of venous flow, normal respiratory plasticity and response to augmentation. Limited views of the contralateral common femoral vein are unremarkable. OTHER Bilateral popliteal fossa cysts. On the right, this measures 5.9 x 3.2 x 1.7 cm. On the left this measures 3.9 x 2.1 x 2.0 cm. Limitations: none  IMPRESSION: No evidence of lower extremity DVT. Bilateral Baker's cysts. Electronically Signed   By: Rolm Baptise M.D.   On: 07/02/2021 23:37   DG Chest Port 1 View  Result Date: 07/04/2021 CLINICAL DATA:  Acute respiratory failure with hypoxia. EXAM: PORTABLE CHEST 1 VIEW COMPARISON:  July 02, 2021. FINDINGS: Stable cardiomegaly. Endotracheal and nasogastric tubes are unchanged in position. Bilateral internal jugular catheters are unchanged in position. No pneumothorax is noted. Stable bibasilar atelectasis is noted. Stable small left pleural effusion is noted. Bony thorax is unremarkable. IMPRESSION: Stable support apparatus. Stable bilateral lung opacities as described above. Electronically Signed   By: Marijo Conception M.D.   On: 07/04/2021 07:30   DG Chest Port 1 View  Result Date: 07/02/2021 CLINICAL DATA:  75 year old female with pleural effusion. EXAM: PORTABLE CHEST - 1 VIEW COMPARISON:  06/30/2021 FINDINGS: The mediastinal contours are within normal limits. Unchanged cardiomegaly. Endotracheal tube in place with the tip in the midthoracic trachea. Gastric decompression tube courses off the inferior aspect of this image. Unchanged position of the previously visualized bilateral central venous catheters. Low lung volumes. Similar appearing cicatricial atelectasis in the right lung base mild blunting of the costophrenic angle. Similar appearing retrocardiac opacity and blunting the left  costophrenic angle. No pneumothorax or new focal consolidation. No acute osseous abnormality. IMPRESSION: 1. Slight interval increase in previously visualized left pleural effusion which is small. There is associated left basilar atelectasis. 2. Similar appearing right basilar subsegmental atelectasis and possible trace right pleural effusion. 3. Unchanged in appropriate appearing positions of indwelling support lines and tubes. Electronically Signed   By: Ruthann Cancer MD   On: 07/02/2021 15:18     Medications:    sodium chloride     dexmedetomidine (PRECEDEX) IV infusion 0.8 mcg/kg/hr (07/04/21 0841)   esmolol Stopped (07/03/21 2037)   fentaNYL infusion INTRAVENOUS 100 mcg/hr (07/04/21 0209)   fluconazole (DIFLUCAN) IV Stopped (07/03/21 1812)   piperacillin-tazobactam (ZOSYN)  IV 2.25 g (07/04/21 1007)   TPN ADULT (ION) 65 mL/hr at 07/04/21 0209    chlorhexidine gluconate (MEDLINE KIT)  15 mL Mouth Rinse BID   Chlorhexidine Gluconate Cloth  6 each Topical Q0600   heparin  5,000 Units Subcutaneous Q8H   insulin aspart  0-20 Units Subcutaneous Q4H   insulin detemir  20 Units Subcutaneous BID   mouth rinse  15 mL Mouth Rinse 10 times per day   pantoprazole (PROTONIX) IV  40 mg Intravenous Q12H     Assessment/ Plan:  Ms. BRIXTON SCHNAPP is a 75 y.o.  female who has been admitted for community acquired pneumonia.    Acute Kidney Injury on chronic kidney disease stage 4 with baseline creatinine 1.97 and GFR of 25 on 12/22/19.  Acute kidney injury secondary to sepsis and obstructive uropathy Chronic kidney disease is secondary to diabetic nephropathy Continue holding losartan and metformin  - UOP and Creatinine are improving; may not need further HD but will assess daily  Lab Results  Component Value Date   CREATININE 3.67 (H) 07/04/2021   CREATININE 3.85 (H) 07/03/2021   CREATININE 4.01 (H) 07/02/2021    Intake/Output Summary (Last 24 hours) at 07/04/2021 0910 Last data filed at 07/04/2021  1219 Gross per 24 hour  Intake 2121.63 ml  Output 2405 ml  Net -283.37 ml    2. Acute Respiratory failure secondary to community acquired pneumonia failing outpatient antibiotics.  Requiring mechanical ventilation.   3. Anemia of chronic kidney disease Normocytic Lab Results  Component  Value Date   HGB 7.3 (L) 07/04/2021   Low threshold to initiate ESA Dose given 07/01/21  4.  Diabetes mellitus type II with chronic kidney disease insulin dependent. Most recent hemoglobin A1c is 8.3 on 06/12/21.  Metformin held    5. Nephrolithiasis- with obstructive uropathy and symptoms consistent with UTI. Urine culture positive for 30,000 colonies of yeast.  Followed by Urology outpatient with urethral stent placement in past  6. S/p  duodenal perforation s/p repair on 06/30/2021 Getting TPN     LOS: 7 Leslie Jester 8/9/20229:10 AM

## 2021-07-04 NOTE — Progress Notes (Signed)
NAME:  Heather Lopez, MRN:  [id number], DOB:  1946/10/24, LOS: 7 ADMISSION DATE:  06/27/2021, CONSULTATION DATE:  06/30/21 REFERRING MD:  Hampton Abbot, MD  CHIEF COMPLAINT:  Abdominal Pain   HPI  75 y.o with significant PMH as below who presented to the ED on 06/27/21 with chief complaints of progressive shortness of breath, productive cough, loss of appetite, nausea, dry heaving fatigue, malaise and generalized body aches. Patient initially went to her PCP on 7/29 with complaints of rhinorrhea, harsh cough, loss of appetite, nausea, fatigue, malaise and generalized body aches x2 days, patient was diagnosed with pneumonia of which she received ceftriaxone 1 mg IM and sent home with Levaquin, prednisone and Tessalon.  Per ED reports, patient reported progressive worsening of her symptoms during ED visit.   Past Medical History  Diabetes mellitus type 2 Anxiety and depression Hypertension Hyperlipidemia Hydronephrosis and recurrent UTI Gout and osteoarthritis Significant Hospital Events   8/2: Admitted to MedSurg unit with sepsis 8/3: Vascular consulted for HD catheter 8/4: Patient developed acute abdomen.  CT abdomen shows viscus perforation.  Patient taken to the OR urgently for exploratory lap.  Patient return to ICU on vent.  PCCM consulted 07/01/21- patient had SBT today for recruitment and chest physiotherapy.  Still has significant tachycardia and hypertension when awake (possible pain related) but mentation is good able to follow verbal communication.  07/02/21-patient has failed SBT today due to hypoxemia.  Reviewed care plan with surgery team.  07/03/21- Lung V/Q scan obtained ~ negative for PE.  Weaning FiO2, currently at 50% 8/9 severe resp failure, remains on vent  Consults:  Nephrology Vascular PCCM General Surgery  Procedures:  8/5: Exploratory Lap   Significant Diagnostic Tests:  8/2: Chest Xray>left basilar consolidation or atelectasis and suspected small left pleural effusion 8/2:  Renal ultrasound> moderate right hydronephrosis, mild left hydronephrosis new with bilateral nephrolithiasis 8/4: CTA abdomen and pelvis> Extensive pneumoperitoneum compatible with perforated viscus. Gastric perforation is suspected, though bowel wall defect is not definitively identified. Surgical consultation is recommended. 2. Large ill-defined mass arising from the upper pole left kidney consistent with renal cell carcinoma. Dedicated renal MRI may be useful when clinical situation permits. 3. Acute left-sided hydronephrosis due to a 12 mm left UPJ calculus. 4. Chronic right hydronephrosis and hydroureter without clear etiology. 5. Bilateral nonobstructing renal calculi as above. 6. Small volume ascites. 7. Trace bilateral pleural effusions with patchy bibasilar atelectasis. 8/7: Venous US BLE>> negative for DVT 8/8: Lung V/Q Scan>>Negative for pulmonary embolus.  Micro Data:  8/2: SARS-CoV-2 PCR> negative 8/2: Influenza PCR> negative 8/2: Blood culture x2> no growth 8/2: Urine Culture> 30,000 colonies yeast 8/2: Legionella urinary antigen> negative  Antimicrobials:  8/4 Fluconazole> 8/4 Zosyn>   HPI Remains intubated On vent Fevers last night  INTERVAL CHANGES Remains on vent CT abd pelvis pending   OBJECTIVE  Blood pressure (!) 129/56, pulse 81, temperature 99.5 F (37.5 C), resp. rate 14, height 5' 2.01" (1.575 m), weight 102.9 kg, SpO2 97 %.    Vent Mode: PRVC FiO2 (%):  [30 %-45 %] 30 % Set Rate:  [14 bmp] 14 bmp Vt Set:  [490 mL] 490 mL PEEP:  [5 cmH20] 5 cmH20   Intake/Output Summary (Last 24 hours) at 07/04/2021 1052 Last data filed at 07/04/2021 0539 Gross per 24 hour  Intake 1838.6 ml  Output 2405 ml  Net -566.4 ml    Filed Weights   06/27/21 0028 06/28/21 0100 06/30/21 0345  Weight: 99.8 kg 106.8 kg  102.9 kg   REVIEW OF SYSTEMS  PATIENT IS UNABLE TO PROVIDE COMPLETE REVIEW OF SYSTEMS DUE TO SEVERE CRITICAL ILLNESS AND TOXIC METABOLIC  ENCEPHALOPATHY   PHYSICAL EXAMINATION:  GENERAL:critically ill appearing, +resp distress MOUTH: intubated PULMONARY: +rhonchi, +wheezing CARDIOVASCULAR: S1 and S2. Regular rate and rhythm. No murmurs, rubs, or gallops.  GASTROINTESTINAL: Distended, Midline incision with honey comb dressing dry and intact. JP Drains x 2 MUSCULOSKELETAL: No swelling, clubbing, or edema.  NEUROLOGIC: obtunded SKIN:intact,warm,dry  Labs/imaging that I havepersonally reviewed  (right click and "Reselect all SmartList Selections" daily)     Labs   CBC: Recent Labs  Lab 06/28/21 0458 06/29/21 0601 06/30/21 0320 07/01/21 0508 07/02/21 0508 07/02/21 2245 07/03/21 0509 07/04/21 0440  WBC 20.2*   < > 30.3* 30.3* 23.2*  --  28.9* 24.4*  NEUTROABS 18.3*  --  27.9* 28.1*  --   --  26.6*  --   HGB 8.8*   < > 10.0* 7.8* 7.5* 7.5* 7.4* 7.3*  HCT 25.7*   < > 28.8* 23.0* 22.1* 23.2* 22.3* 22.6*  MCV 78.4*   < > 81.1 81.0 83.1  --  84.8 87.3  PLT 317   < > 288 231 190  --  161 145*   < > = values in this interval not displayed.     Basic Metabolic Panel: Recent Labs  Lab 06/30/21 0250 06/30/21 0320 07/01/21 0508 07/02/21 0508 07/03/21 0509 07/04/21 0440  NA  --  130* 135 134* 141 145  K  --  4.4 4.5 4.1 3.8 4.0  CL  --  100 101 102 107 110  CO2  --  19* 22 28 25 25   GLUCOSE  --  157* 320* 360* 234* 209*  BUN  --  141* 110* 91* 98* 93*  CREATININE  --  6.20* 5.40* 4.01* 3.85* 3.67*  CALCIUM  --  7.7* 8.6* 8.3* 8.4* 8.6*  MG  --  1.7 2.3 2.2 2.0 1.9  PHOS 8.2*  --  8.8* 5.1* 2.9 3.0    GFR: Estimated Creatinine Clearance: 15.1 mL/min (A) (by C-G formula based on SCr of 3.67 mg/dL (H)). Recent Labs  Lab 06/28/21 0458 06/29/21 0601 06/30/21 0320 07/01/21 0508 07/02/21 0508 07/03/21 0509 07/04/21 0440  PROCALCITON 5.48 3.18  --   --   --   --   --   WBC 20.2* 20.8* 30.3* 30.3* 23.2* 28.9* 24.4*  LATICACIDVEN  --   --  1.3  --   --   --   --      Liver Function Tests: Recent Labs   Lab 06/27/21 1459 06/27/21 2231 06/28/21 0458 07/01/21 0508 07/03/21 0509  AST  --   --  13* 43* 38  ALT  --   --  16 44 34  ALKPHOS  --   --  62 61 77  BILITOT  --   --  0.5 1.1 0.4  PROT  --   --  6.1* 5.7* 5.7*  ALBUMIN 2.7* 2.6* 2.5* 1.9* 1.6*    No results for input(s): LIPASE, AMYLASE in the last 168 hours. No results for input(s): AMMONIA in the last 168 hours.  ABG    Component Value Date/Time   PHART 7.32 (L) 06/30/2021 2200   PCO2ART 37 06/30/2021 2200   PO2ART 104 06/30/2021 2200   HCO3 19.1 (L) 06/30/2021 2200   ACIDBASEDEF 6.4 (H) 06/30/2021 2200   O2SAT 97.5 06/30/2021 2200     Past Medical History  She,  has  a past medical history of Anxiety, Cataract, Depression, Diabetes mellitus without complication ([place]), Gout, History of kidney stones, Hyperlipidemia, Hypertension, and Vertigo.   Surgical History    Past Surgical History:  Procedure Laterality Date   CATARACT EXTRACTION, BILATERAL Bilateral 2019   COLONOSCOPY WITH PROPOFOL N/A 08/19/2015   Procedure: COLONOSCOPY WITH PROPOFOL;  Surgeon: Lucilla Lame, MD;  Location: [place];  Service: Endoscopy;  Laterality: N/A;  diabetic - insulin   CYSTOSCOPY W/ RETROGRADES Bilateral 12/22/2019   Procedure: CYSTOSCOPY WITH RETROGRADE PYELOGRAM;  Surgeon: Abbie Sons, MD;  Location: [facility];  Service: Urology;  Laterality: Bilateral;   CYSTOSCOPY WITH BIOPSY N/A 12/22/2019   Procedure: CYSTOSCOPY WITH bladder BIOPSY;  Surgeon: Abbie Sons, MD;  Location: [facility];  Service: Urology;  Laterality: N/A;   CYSTOSCOPY WITH STENT PLACEMENT Right 12/22/2019   Procedure: CYSTOSCOPY WITH STENT PLACEMENT;  Surgeon: Abbie Sons, MD;  Location: [facility];  Service: Urology;  Laterality: Right;   CYSTOSCOPY WITH URETEROSCOPY Bilateral 12/22/2019   Procedure: CYSTOSCOPY WITH URETEROSCOPY;  Surgeon: Abbie Sons, MD;  Location: [facility];  Service: Urology;  Laterality: Bilateral;    CYSTOSCOPY/URETEROSCOPY/HOLMIUM LASER/STENT PLACEMENT Right 12/22/2019   Procedure: CYSTOSCOPY/URETEROSCOPY/HOLMIUM LASER/STENT PLACEMENT;  Surgeon: Abbie Sons, MD;  Location: [facility];  Service: Urology;  Laterality: Right;   DIALYSIS/PERMA CATHETER INSERTION N/A 06/29/2021   Procedure: DIALYSIS/PERMA CATHETER INSERTION;  Surgeon: Algernon Huxley, MD;  Location: [place] [facility];  Service: Cardiovascular;  Laterality: N/A;   EYE SURGERY     LAPAROTOMY N/A 06/29/2021   Procedure: EXPLORATORY LAPAROTOMY WITH REPAIR OF DUODENAL PERFORATION;  Surgeon: [name], MD;  Location: [facility];  Service: General;  Laterality: N/A;   POLYPECTOMY  08/19/2015   Procedure: POLYPECTOMY;  Surgeon: Lucilla Lame, MD;  Location: [place];  Service: Endoscopy;;   TUBAL LIGATION  1992    Allergies Allergies  Allergen Reactions   Contrast Media  [Iodinated Diagnostic Agents] Anaphylaxis   Iodine Swelling    (IV only) - angioedema       Active Hospital Problem list   Acute hypoxic respiratory failure Severe sepsis with septic shock Bowel perforation AKI on CKD stage IV Right UPJ stone with hydronephrosis Anion gap metabolic acidosis Lactic acidosis Diabetes mellitus  Assessment & Plan:    75 yo white female with acute severe sepsis and shock due to perforated Duodenal Ulcer complicated by acute resp failure with CAP effusions and atelectasis with stage 4 CKD  Severe ACUTE Hypoxic and Hypercapnic Respiratory Failure Acute Hypoxic Respiratory Failure in setting of Community Acquired Pneumonia,  bilateral pleural effusions with associated atelectasis -continue Mechanical Ventilator support -continue Bronchodilator Therapy -Wean Fio2 and PEEP as tolerated -VAP/VENT bundle implementation Continue Zosyn (previously on Ceftriaxone and Azithromycin) Unable to wean at this time   INFECTIOUS DISEASE -continue antibiotics as prescribed -follow up cultures Severe Sepsis secondary to UTI,  Community Acquired Pneumonia  -S/p exploratory laparotomy and primary repair of duodenal ulcer with omental patch on 06/29/21   SEPTIC shock SOURCE-per abd -use vasopressors to keep MAP>65 as needed -follow ABG and LA as needed -follow up cultures -emperic ABX Wean off esmolol infusion   ACUTE KIDNEY INJURY/Renal Failure CKD 4 -continue Foley Catheter-assess need -Avoid nephrotoxic agents -Follow urine output, BMP -Ensure adequate renal perfusion, optimize oxygenation -Renal dose medications Suspected renal cell carcinoma, will need further eval/biopsy  Intake/Output Summary (Last 24 hours) at 07/04/2021 1058 Last data filed at 07/04/2021 6045 Gross per 24 hour  Intake  1838.6 ml  Output 2405 ml  Net -566.4 ml     ENDO - ICU hypoglycemic\Hyperglycemia protocol -check FSBS per protocol    GI GI PROPHYLAXIS as indicated On TPN   Best practice:  Diet:  NPO, TPN Pain/Anxiety/Delirium protocol (if indicated): Yes (RASS goal 0) VAP protocol (if indicated): Yes DVT prophylaxis: Heparin gtt GI prophylaxis: PPI Glucose control:  SSI Yes Central venous access:  Yes, and it is still needed Arterial line:  Yes, and it is still needed Foley:  Yes, and it is still needed Mobility:  bed rest  PT consulted: N/A Last date of multidisciplinary goals of care discussion [8/8] Code Status:  full code Disposition: ICU     DVT/GI PRX  assessed I Assessed the need for Labs I Assessed the need for Foley I Assessed the need for Central Venous Line Family Discussion when available I Assessed the need for Mobilization I made an Assessment of medications to be adjusted accordingly Safety Risk assessment completed  CASE DISCUSSED IN MULTIDISCIPLINARY ROUNDS WITH ICU TEAM     Critical Care Time devoted to patient care services described in this note is 55 minutes.  Critical care was necessary to treat /prevent imminent and life-threatening deterioration. Overall, patient is  critically ill, prognosis is guarded.  Patient with Multiorgan failure and at high risk for cardiac arrest and death.    Corrin Parker, M.D.  [facility]  Medical Director [place] Director [facility] Cardio-Pulmonary Department       .

## 2021-07-04 NOTE — Consult Note (Signed)
PHARMACY - TOTAL PARENTERAL NUTRITION CONSULT NOTE   Indication:  Prolonged NPO  Patient Measurements: Height: 5' 2.01" (157.5 cm) Weight: 102.9 kg (226 lb 13.7 oz) IBW/kg (Calculated) : 50.12 TPN AdjBW (KG): 62.5 Body mass index is 41.48 kg/m.  Assessment:  Patient is a [name] with medical history including HTN, diabetes, HLD, anxiety / depression, gout who was admitted 8/2 with acute renal failure, acute respiratory failure secondary to pneumonia. Hospital course complicated by perforated duodenal ulcer and patient was emergently taken to OR 8/5 for ExLap, repair of perforated duodenal ulcer with creation of omental flap. Post-operatively, patient admitted to the ICU where she remains intubated, sedated, and on mechanical ventilation. Pharmacy has been consulted to initiate TPN for prolonged NPO status.   Glucose / Insulin: Last A1c 8.3%.  24 hr glucose range 189 - 257 24 hr SSI requirements 40 units Electrolytes: Within normal limits. Hyperphosphatemia resolved Renal: Nephrology following for iHD. Scr down-trending Hepatic: Within normal limits Intake / Output; MIVF: Net negative 2.2 L for the admission GI Imaging: 8/4 CT Abdomen / pelvis: Extensive pneumoperitoneum compatible with perforated viscus. Large ill-defined mass arising from the upper pole left kidney consistent with renal cell carcinoma.  Acute left-sided hydronephrosis due to a 12 mm left UPJ calculus. Chronic right hydronephrosis and hydroureter without clear etiology. GI Surgeries / Procedures:  8/5: ExLap with repair of perforated duodenal ulcer with creation of omental flap  Central access: 8/5  TPN start date:  8/5  Nutritional Goals (per RD recommendation on 8/5): kCal: 1150 - 1450 kcal/day, Protein: 100 - 125 g/day, Fluid: 1.2 - 1.5 L/day  Goal TPN rate is 65 mL/hr (provides 101 g of protein and 1379 kcals per day)  Current Nutrition:  NPO  Plan:  --Continue TPN at 65 mL/hr Amino acids: 101.4  g Dextrose: 171.6 g Lipids: 39 g Kcal: 1379 Fluid: 1660 mL (including overfill) --Electrolytes in TPN: Na 66mEq/L, K 67mEq/L, Ca 14mEq/L, Mg 62mEq/L, and Phos 73mmol/L. Cl:Ac 1:1 --Add standard MVI and trace elements to TPN --Continue rSSI + Levemir 20 units BID. Added 50 units of insulin to TPN yesterday. CBGs improving, will hold off on further adjustments pending further glucose trend. --Monitor TPN labs on Mon/Thurs at minimum  Tawnya Crook, PharmD, BCPS 07/04/2021,10:53 AM

## 2021-07-04 NOTE — Progress Notes (Signed)
[place] Hospital Day(s): 7.   Post op day(s): 5 Days Post-Op.   Interval History:  Patient seen and examined She continues to have low grade fevers overnight; T-Max 101.29F She remains ventilated Leukocytosis improved slightly today to 24.4K Hgb remains stable at 7.3; no signs of bleeding  Renal function remains elevated but improving; sCr - 3.67; UO - 2.7L No significant electrolyte derangements Surgical drains:             - Right mid abdomen (draining repair site); 110 ccs; serous             - RLQ (draining dependent pelvis): 2 ccs; serous She continues on NGT decompression; LIS; output 120 ccs Continues on Zosyn Continue on TPN; goal rate  Vital signs in last 24 hours: [min-max] current  Temp:  [99.5 F (37.5 C)-101.7 F (38.7 C)] 99.5 F (37.5 C) (08/09 0200) Pulse Rate:  [77-113] 81 (08/09 0500) Resp:  [13-22] 15 (08/09 0500) BP: (134-191)/(55-150) 154/60 (08/09 0500) SpO2:  [88 %-100 %] 98 % (08/09 0500) Arterial Line BP: (133-224)/(43-108) 166/58 (08/09 0500) FiO2 (%):  [30 %-50 %] 30 % (08/09 0421)     Height: 5' 2.01" (157.5 cm) Weight: 102.9 kg BMI (Calculated): 41.48   Intake/Output last 2 shifts:  08/08 0701 - 08/09 0700 In: 2121.6 [I.V.:1921.6; IV Piggyback:200] Out: 3035 [Urine:2795; Emesis/NG output:120; Drains:120]   Physical Exam:  Constitutional: intubated, sedated HEENT: NGT in place; output slowing; bilious fluid in canister (~200 ccs) Respiratory: intubated Cardiovascular: regular rate, sinus rhythm Gastrointestinal: Soft, unable to reliably assess tenderness, non-distended, surgical drains x2 in right lower abdomen, output serous Integumentary: Midline incision is CDI with staples and penrose drain, honeycomb in place   Labs:  CBC Latest Ref Rng & Units 07/04/2021 07/03/2021 07/02/2021  WBC 4.0 - 10.5 K/uL 24.4(H) 28.9(H) -  Hemoglobin 12.0 - 15.0 g/dL 7.3(L) 7.4(L) 7.5(L)  Hematocrit 36.0 - 46.0 %  22.6(L) 22.3(L) 23.2(L)  Platelets 150 - 400 K/uL 145(L) 161 -   CMP Latest Ref Rng & Units 07/04/2021 07/03/2021 07/02/2021  Glucose 70 - 99 mg/dL 209(H) 234(H) 360(H)  BUN 8 - 23 mg/dL 93(H) 98(H) 91(H)  Creatinine 0.44 - 1.00 mg/dL 3.67(H) 3.85(H) 4.01(H)  Sodium 135 - 145 mmol/L 145 141 134(L)  Potassium 3.5 - 5.1 mmol/L 4.0 3.8 4.1  Chloride 98 - 111 mmol/L 110 107 102  CO2 22 - 32 mmol/L 25 25 28   Calcium 8.9 - 10.3 mg/dL 8.6(L) 8.4(L) 8.3(L)  Total Protein 6.5 - 8.1 g/dL - 5.7(L) -  Total Bilirubin 0.3 - 1.2 mg/dL - 0.4 -  Alkaline Phos 38 - 126 U/L - 77 -  AST 15 - 41 U/L - 38 -  ALT 0 - 44 U/L - 34 -    Imaging studies: No new pertinent imaging studies   Assessment/Plan:  75 y.o. female with persistent leukocytosis and intermittent fevers in the last 24 hours 5 Days Post-Op s/p exploratory laparotomy and primary repair of duodenal ulcer with omental patch   - Given her fevers in the last 24 hours and persistent leukocytosis, we will go ahead and proceed with CT Abdomen/Pelvis (PO contrast only given IV allergy) to reassess surgical repair and rule out any additional intra-abdominal etiology to explain her fevers/leukocytosis.    - Greatly appreciate PCCM assistance; ventilatory support    - Continue strict NPO for now; pending evaluation of repair today   - Continue NGT decompression; LIS; monitor ad record output              -  Continue TPN; at goal rate. Once repair is reassessed and without leak, we may be able to transition to trickle feeds via NGT - Continue IV Abx (Zosyn); Day 5 - Monitor abdominal examination; on-going bowel function - Maintain surgical drains; monitor and record output    All of the above findings and recommendations were discussed with the medical team.   -- Edison Simon, PA-C [facility] 07/04/2021, 7:47 AM [phone] M-F: 7am - 4pm

## 2021-07-05 ENCOUNTER — Inpatient Hospital Stay: Payer: Medicare Other

## 2021-07-05 ENCOUNTER — Inpatient Hospital Stay: Payer: Medicare Other | Admitting: Certified Registered"

## 2021-07-05 ENCOUNTER — Encounter
Admission: EM | Disposition: A | Discharge status: Nursing Home (Includes ICF) | Payer: Self-pay | Source: Home / Self Care | Attending: Internal Medicine

## 2021-07-05 ENCOUNTER — Encounter: Payer: Self-pay | Admitting: Urology

## 2021-07-05 DIAGNOSIS — N189 Chronic kidney disease, unspecified: Secondary | ICD-10-CM

## 2021-07-05 DIAGNOSIS — N179 Acute kidney failure, unspecified: Secondary | ICD-10-CM

## 2021-07-05 DIAGNOSIS — N132 Hydronephrosis with renal and ureteral calculous obstruction: Secondary | ICD-10-CM

## 2021-07-05 DIAGNOSIS — J188 Other pneumonia, unspecified organism: Secondary | ICD-10-CM

## 2021-07-05 DIAGNOSIS — N170 Acute kidney failure with tubular necrosis: Secondary | ICD-10-CM | POA: Diagnosis not present

## 2021-07-05 DIAGNOSIS — N134 Hydroureter: Secondary | ICD-10-CM

## 2021-07-05 DIAGNOSIS — A419 Sepsis, unspecified organism: Secondary | ICD-10-CM

## 2021-07-05 DIAGNOSIS — J9601 Acute respiratory failure with hypoxia: Secondary | ICD-10-CM

## 2021-07-05 DIAGNOSIS — N151 Renal and perinephric abscess: Secondary | ICD-10-CM

## 2021-07-05 HISTORY — PX: CYSTOSCOPY W/ URETERAL STENT PLACEMENT: SHX1429

## 2021-07-05 LAB — BASIC METABOLIC PANEL
Anion gap: 8 (ref 5–15)
BUN: 104 mg/dL — ABNORMAL HIGH (ref 8–23)
CO2: 25 mmol/L (ref 22–32)
Calcium: 8.7 mg/dL — ABNORMAL LOW (ref 8.9–10.3)
Chloride: 113 mmol/L — ABNORMAL HIGH (ref 98–111)
Creatinine, Ser: 3.79 mg/dL — ABNORMAL HIGH (ref 0.44–1.00)
GFR, Estimated: 12 mL/min — ABNORMAL LOW (ref >60)
Glucose, Bld: 235 mg/dL — ABNORMAL HIGH (ref 70–99)
Potassium: 3.9 mmol/L (ref 3.5–5.1)
Sodium: 146 mmol/L — ABNORMAL HIGH (ref 135–145)

## 2021-07-05 LAB — GLUCOSE, CAPILLARY
Glucose-Capillary: 212 mg/dL — ABNORMAL HIGH (ref 70–99)
Glucose-Capillary: 229 mg/dL — ABNORMAL HIGH (ref 70–99)
Glucose-Capillary: 233 mg/dL — ABNORMAL HIGH (ref 70–99)
Glucose-Capillary: 272 mg/dL — ABNORMAL HIGH (ref 70–99)
Glucose-Capillary: 357 mg/dL — ABNORMAL HIGH (ref 70–99)
Glucose-Capillary: 407 mg/dL — ABNORMAL HIGH (ref 70–99)

## 2021-07-05 LAB — CBC WITH DIFFERENTIAL/PLATELET
Abs Immature Granulocytes: 0.88 10*3/uL — ABNORMAL HIGH (ref 0.00–0.07)
Basophils Absolute: 0 10*3/uL (ref 0.0–0.1)
Basophils Relative: 0 %
Eosinophils Absolute: 0.3 10*3/uL (ref 0.0–0.5)
Eosinophils Relative: 2 %
HCT: 21.8 % — ABNORMAL LOW (ref 36.0–46.0)
Hemoglobin: 6.8 g/dL — ABNORMAL LOW (ref 12.0–15.0)
Immature Granulocytes: 4 %
Lymphocytes Relative: 5 %
Lymphs Abs: 1 10*3/uL (ref 0.7–4.0)
MCH: 27.3 pg (ref 26.0–34.0)
MCHC: 31.2 g/dL (ref 30.0–36.0)
MCV: 87.6 fL (ref 80.0–100.0)
Monocytes Absolute: 1.1 10*3/uL — ABNORMAL HIGH (ref 0.1–1.0)
Monocytes Relative: 5 %
Neutro Abs: 17.3 10*3/uL — ABNORMAL HIGH (ref 1.7–7.7)
Neutrophils Relative %: 84 %
Platelets: 161 10*3/uL (ref 150–400)
RBC: 2.49 MIL/uL — ABNORMAL LOW (ref 3.87–5.11)
RDW: 16 % — ABNORMAL HIGH (ref 11.5–15.5)
WBC: 20.6 10*3/uL — ABNORMAL HIGH (ref 4.0–10.5)
nRBC: 0 % (ref 0.0–0.2)

## 2021-07-05 LAB — MAGNESIUM: Magnesium: 1.9 mg/dL (ref 1.7–2.4)

## 2021-07-05 LAB — HEMOGLOBIN AND HEMATOCRIT, BLOOD
HCT: 21.7 % — ABNORMAL LOW (ref 36.0–46.0)
HCT: 25.6 % — ABNORMAL LOW (ref 36.0–46.0)
Hemoglobin: 6.6 g/dL — ABNORMAL LOW (ref 12.0–15.0)
Hemoglobin: 8 g/dL — ABNORMAL LOW (ref 12.0–15.0)

## 2021-07-05 LAB — PHOSPHORUS: Phosphorus: 2.9 mg/dL (ref 2.5–4.6)

## 2021-07-05 LAB — PREPARE RBC (CROSSMATCH)

## 2021-07-05 LAB — ABO/RH: ABO/RH(D): O POS

## 2021-07-05 SURGERY — CYSTOSCOPY, WITH RETROGRADE PYELOGRAM AND URETERAL STENT INSERTION
Anesthesia: General | Laterality: Bilateral

## 2021-07-05 MED ORDER — TRAVASOL 10 % IV SOLN
INTRAVENOUS | Status: AC
  Filled 2021-07-05: qty 1014

## 2021-07-05 MED ORDER — ADULT MULTIVITAMIN LIQUID CH
15.0000 mL | Freq: Every day | ORAL | Status: DC
  Administered 2021-07-08 – 2021-07-11 (×4): 15 mL
  Filled 2021-07-05 (×6): qty 15

## 2021-07-05 MED ORDER — PROPOFOL 10 MG/ML IV BOLUS
INTRAVENOUS | Status: AC
  Filled 2021-07-05: qty 20

## 2021-07-05 MED ORDER — PROSOURCE TF PO LIQD
45.0000 mL | Freq: Three times a day (TID) | ORAL | Status: DC
  Administered 2021-07-05 – 2021-07-10 (×16): 45 mL
  Filled 2021-07-05 (×17): qty 45

## 2021-07-05 MED ORDER — DEXMEDETOMIDINE (PRECEDEX) IN NS 20 MCG/5ML (4 MCG/ML) IV SYRINGE
PREFILLED_SYRINGE | INTRAVENOUS | Status: DC | PRN
  Administered 2021-07-05 (×2): 12 ug via INTRAVENOUS

## 2021-07-05 MED ORDER — FENTANYL CITRATE (PF) 100 MCG/2ML IJ SOLN
INTRAMUSCULAR | Status: AC
  Filled 2021-07-05: qty 2

## 2021-07-05 MED ORDER — PIPERACILLIN-TAZOBACTAM IN DEX 2-0.25 GM/50ML IV SOLN
2.2500 g | Freq: Four times a day (QID) | INTRAVENOUS | Status: AC
  Administered 2021-07-05 – 2021-07-08 (×11): 2.25 g via INTRAVENOUS
  Filled 2021-07-05 (×13): qty 50

## 2021-07-05 MED ORDER — INSULIN ASPART 100 UNIT/ML IJ SOLN
3.0000 [IU] | INTRAMUSCULAR | Status: DC
  Administered 2021-07-05 – 2021-07-06 (×6): 3 [IU] via SUBCUTANEOUS
  Filled 2021-07-05 (×6): qty 1

## 2021-07-05 MED ORDER — SODIUM CHLORIDE 0.9% IV SOLUTION: Freq: Once | INTRAVENOUS | Status: AC

## 2021-07-05 MED ORDER — ROCURONIUM BROMIDE 100 MG/10ML IV SOLN
INTRAVENOUS | Status: DC | PRN
  Administered 2021-07-05: 40 mg via INTRAVENOUS
  Administered 2021-07-05: 10 mg via INTRAVENOUS

## 2021-07-05 MED ORDER — PROPOFOL 10 MG/ML IV BOLUS
INTRAVENOUS | Status: DC | PRN
  Administered 2021-07-05: 30 mg via INTRAVENOUS

## 2021-07-05 MED ORDER — PHENYLEPHRINE HCL (PRESSORS) 10 MG/ML IV SOLN
INTRAVENOUS | Status: DC | PRN
  Administered 2021-07-05: 100 ug via INTRAVENOUS

## 2021-07-05 MED ORDER — DEXAMETHASONE SODIUM PHOSPHATE 10 MG/ML IJ SOLN
INTRAMUSCULAR | Status: DC | PRN
  Administered 2021-07-05: 10 mg via INTRAVENOUS

## 2021-07-05 MED ORDER — FENTANYL CITRATE (PF) 100 MCG/2ML IJ SOLN
INTRAMUSCULAR | Status: DC | PRN
  Administered 2021-07-05: 100 ug via INTRAVENOUS

## 2021-07-05 MED ORDER — DIPHENHYDRAMINE HCL 50 MG/ML IJ SOLN
INTRAMUSCULAR | Status: DC | PRN
  Administered 2021-07-05: 25 mg via INTRAVENOUS

## 2021-07-05 MED ORDER — PIVOT 1.5 CAL PO LIQD
1000.0000 mL | ORAL | Status: DC
  Administered 2021-07-06 – 2021-07-11 (×6): 1000 mL
  Filled 2021-07-05: qty 1000

## 2021-07-05 MED ORDER — ACETAMINOPHEN 325 MG PO TABS
650.0000 mg | ORAL_TABLET | Freq: Four times a day (QID) | ORAL | Status: DC | PRN
  Administered 2021-07-05 – 2021-07-14 (×4): 650 mg
  Filled 2021-07-05 (×4): qty 2

## 2021-07-05 MED ORDER — LACTATED RINGERS IV SOLN: INTRAVENOUS | Status: DC | PRN

## 2021-07-05 MED ORDER — JUVEN PO PACK
1.0000 | PACK | Freq: Two times a day (BID) | ORAL | Status: DC
  Administered 2021-07-06 – 2021-08-03 (×54): 1
  Filled 2021-07-05: qty 1

## 2021-07-05 MED ORDER — 0.9 % SODIUM CHLORIDE (POUR BTL) OPTIME
TOPICAL | Status: DC | PRN
  Administered 2021-07-05: 20 mL

## 2021-07-05 MED ORDER — IOHEXOL 180 MG/ML  SOLN
INTRAMUSCULAR | Status: DC | PRN
  Administered 2021-07-05: 25 mL

## 2021-07-05 SURGICAL SUPPLY — 28 items
BAG DRN LRG CPC RND TRDRP CNTR (MISCELLANEOUS) ×1
BAG URO DRAIN 4000ML (MISCELLANEOUS) ×2 IMPLANT
BRUSH SCRUB EZ 1% IODOPHOR (MISCELLANEOUS) ×2 IMPLANT
CATH FOL 2WAY LX 16X5 (CATHETERS) ×2 IMPLANT
CATH URETL OPEN 5X70 (CATHETERS) ×2 IMPLANT
CNTNR SPEC 2.5X3XGRAD LEK (MISCELLANEOUS) ×2
CONT SPEC 4OZ STER OR WHT (MISCELLANEOUS) ×2
CONT SPEC 4OZ STRL OR WHT (MISCELLANEOUS) ×2
CONTAINER SPEC 2.5X3XGRAD LEK (MISCELLANEOUS) ×2 IMPLANT
GAUZE 4X4 16PLY ~~LOC~~+RFID DBL (SPONGE) ×4 IMPLANT
GLIDEWIRE STR 0.035 150CM 3CM (WIRE) ×2 IMPLANT
GLOVE SURG UNDER POLY LF SZ7.5 (GLOVE) ×2 IMPLANT
GOWN STRL REUS W/ TWL LRG LVL3 (GOWN DISPOSABLE) ×1 IMPLANT
GOWN STRL REUS W/ TWL XL LVL3 (GOWN DISPOSABLE) ×1 IMPLANT
GOWN STRL REUS W/TWL LRG LVL3 (GOWN DISPOSABLE) ×2
GOWN STRL REUS W/TWL XL LVL3 (GOWN DISPOSABLE) ×2
GUIDEWIRE STR DUAL SENSOR (WIRE) ×2 IMPLANT
IV NS IRRIG 3000ML ARTHROMATIC (IV SOLUTION) ×2 IMPLANT
KIT TURNOVER CYSTO (KITS) ×2 IMPLANT
MANIFOLD NEPTUNE II (INSTRUMENTS) ×2 IMPLANT
Open end ureteral catheter ×2 IMPLANT
PACK CYSTO AR (MISCELLANEOUS) ×2 IMPLANT
SET CYSTO W/LG BORE CLAMP LF (SET/KITS/TRAYS/PACK) ×2 IMPLANT
STENT URET 6FRX24 CONTOUR (STENTS) ×4 IMPLANT
STENT URET 6FRX26 CONTOUR (STENTS) IMPLANT
SURGILUBE 2OZ TUBE FLIPTOP (MISCELLANEOUS) ×2 IMPLANT
SYR 10ML LL (SYRINGE) ×2 IMPLANT
WATER STERILE IRR 1000ML POUR (IV SOLUTION) ×2 IMPLANT

## 2021-07-05 NOTE — Progress Notes (Signed)
[facility]  ROUNDING NOTE   Subjective:   Heather Lopez is a 75 y.o. white female who has been admitted for community acquired pneumonia.    Hospital course complicated by duodenal perforation s/p repair on 06/30/2021 ARF - requiring HD PRN Acute resp failure requiring mech ventilation Left obstructive hydronephrosis and renal abscess  Patient remains critically ill Intubated, sedated,  Has fever Fio2 30% Getting TPN at 65 cc/hr Trickle feeds started  Objective:  Vital signs in last 24 hours:  Temp:  [99.68 F (37.6 C)-100.94 F (38.3 C)] 100.04 F (37.8 C) (08/10 0600) Pulse Rate:  [62-102] 95 (08/10 0600) Resp:  [12-17] 12 (08/10 0600) BP: (129-177)/(49-77) 142/51 (08/10 0600) SpO2:  [95 %-99 %] 96 % (08/10 0600) Arterial Line BP: (145-180)/(43-133) 174/47 (08/10 0500) FiO2 (%):  [30 %] 30 % (08/10 0400)  Weight change:  Filed Weights   06/27/21 0028 06/28/21 0100 06/30/21 0345  Weight: 99.8 kg 106.8 kg 102.9 kg    Intake/Output: I/O last 3 completed shifts: In: 4449.2 [I.V.:3181.7; Other:900; NG/GT:46; IV Piggyback:321.5] Out: 7741 [Urine:2900; Emesis/NG output:20; Drains:120]   Intake/Output this shift:  No intake/output data recorded.  Physical Exam: General: Critically ill  Head: ETT, OGT  Eyes: Anicteric  Lungs:  Vent assisted  Heart: Regular rate and rhythm  Abdomen:  +JP drains  Extremities:  trace peripheral edema.  Neurologic:  sedated  Skin: No lesions  Access:  RIJ temp cath  Left IJ PermCath-nonfunctional  Basic Metabolic Panel: Recent Labs  Lab 07/01/21 0508 07/02/21 0508 07/03/21 0509 07/04/21 0440 07/05/21 0443  NA 135 134* 141 145 146*  K 4.5 4.1 3.8 4.0 3.9  CL 101 102 107 110 113*  CO2 '22 28 25 25 25  ' GLUCOSE 320* 360* 234* 209* 235*  BUN 110* 91* 98* 93* 104*  CREATININE 5.40* 4.01* 3.85* 3.67* 3.79*  CALCIUM 8.6* 8.3* 8.4* 8.6* 8.7*  MG 2.3 2.2 2.0 1.9 1.9  PHOS 8.8* 5.1* 2.9 3.0 2.9     Liver Function  Tests: Recent Labs  Lab 07/01/21 0508 07/03/21 0509  AST 43* 38  ALT 44 34  ALKPHOS 61 77  BILITOT 1.1 0.4  PROT 5.7* 5.7*  ALBUMIN 1.9* 1.6*    No results for input(s): LIPASE, AMYLASE in the last 168 hours. No results for input(s): AMMONIA in the last 168 hours.  CBC: Recent Labs  Lab 06/30/21 0320 07/01/21 0508 07/02/21 0508 07/02/21 2245 07/03/21 0509 07/04/21 0440 07/05/21 0443  WBC 30.3* 30.3* 23.2*  --  28.9* 24.4* 20.6*  NEUTROABS 27.9* 28.1*  --   --  26.6*  --  17.3*  HGB 10.0* 7.8* 7.5* 7.5* 7.4* 7.3* 6.8*  HCT 28.8* 23.0* 22.1* 23.2* 22.3* 22.6* 21.8*  MCV 81.1 81.0 83.1  --  84.8 87.3 87.6  PLT 288 231 190  --  161 145* 161     Cardiac Enzymes: No results for input(s): CKTOTAL, CKMB, CKMBINDEX, TROPONINI in the last 168 hours.  BNP: Invalid input(s): POCBNP  CBG: Recent Labs  Lab 07/04/21 1504 07/04/21 1940 07/04/21 2337 07/05/21 0432 07/05/21 0802  GLUCAP 266* 252* 221* 212* 229*     Microbiology: Results for orders placed or performed during the hospital encounter of 06/27/21  Resp Panel by RT-PCR (Flu A&B, Covid) Nasopharyngeal Swab     Status: None   Collection Time: 06/27/21 12:32 AM   Specimen: Nasopharyngeal Swab; Nasopharyngeal(NP) swabs in vial transport medium  Result Value Ref Range Status   SARS Coronavirus 2  by RT PCR NEGATIVE NEGATIVE Final    Comment: (NOTE) SARS-CoV-2 target nucleic acids are NOT DETECTED.  The SARS-CoV-2 RNA is generally detectable in upper respiratory specimens during the acute phase of infection. The lowest concentration of SARS-CoV-2 viral copies this assay can detect is 138 copies/mL. A negative result does not preclude SARS-Cov-2 infection and should not be used as the sole basis for treatment or other patient management decisions. A negative result may occur with  improper specimen collection/handling, submission of specimen other than nasopharyngeal swab, presence of viral mutation(s) within  the areas targeted by this assay, and inadequate number of viral copies(<138 copies/mL). A negative result must be combined with clinical observations, patient history, and epidemiological information. The expected result is Negative.  Fact Sheet for Patients:  EntrepreneurPulse.com.au  Fact Sheet for Healthcare Providers:  IncredibleEmployment.be  This test is no t yet approved or cleared by the [place] FDA and  has been authorized for detection and/or diagnosis of SARS-CoV-2 by FDA under an Emergency Use Authorization (EUA). This EUA will remain  in effect (meaning this test can be used) for the duration of the COVID-19 declaration under Section 564(b)(1) of the Act, 21 U.S.C.section 360bbb-3(b)(1), unless the authorization is terminated  or revoked sooner.       Influenza A by PCR NEGATIVE NEGATIVE Final   Influenza B by PCR NEGATIVE NEGATIVE Final    Comment: (NOTE) The Xpert Xpress SARS-CoV-2/FLU/RSV plus assay is intended as an aid in the diagnosis of influenza from Nasopharyngeal swab specimens and should not be used as a sole basis for treatment. Nasal washings and aspirates are unacceptable for Xpert Xpress SARS-CoV-2/FLU/RSV testing.  Fact Sheet for Patients: EntrepreneurPulse.com.au  Fact Sheet for Healthcare Providers: IncredibleEmployment.be  This test is not yet approved or cleared by the [place] FDA and has been authorized for detection and/or diagnosis of SARS-CoV-2 by FDA under an Emergency Use Authorization (EUA). This EUA will remain in effect (meaning this test can be used) for the duration of the COVID-19 declaration under Section 564(b)(1) of the Act, 21 U.S.C. section 360bbb-3(b)(1), unless the authorization is terminated or revoked.  Performed at [facility], [address] [place], [place] [address]   Blood culture (routine x 2)     Status: None    Collection Time: 06/27/21 12:43 AM   Specimen: BLOOD  Result Value Ref Range Status   Specimen Description BLOOD RIGHT ASSIST CONTROL  Final   Special Requests   Final    BOTTLES DRAWN AEROBIC AND ANAEROBIC Blood Culture results may not be optimal due to an inadequate volume of blood received in culture bottles   Culture   Final    NO GROWTH 5 DAYS Performed at [facility], [address] [place], [place] [address]    Report Status 07/02/2021 FINAL  Final  Blood culture (routine x 2)     Status: None   Collection Time: 06/27/21  1:45 AM   Specimen: BLOOD  Result Value Ref Range Status   Specimen Description BLOOD RIGHT ASSIST CONTROL  Final   Special Requests   Final    BOTTLES DRAWN AEROBIC AND ANAEROBIC Blood Culture adequate volume   Culture   Final    NO GROWTH 5 DAYS Performed at [facility], [address], [place], [place] [address]    Report Status 07/02/2021 FINAL  Final  Urine Culture     Status: Abnormal   Collection Time: 06/27/21  2:39 AM   Specimen: In/Out  Cath Urine  Result Value Ref Range Status   Specimen Description   Final    IN/OUT CATH URINE Performed at [facility], [address], [place], [place] [address]    Special Requests   Final    NONE Performed at [facility], [place], [place] [address]    Culture 30,000 COLONIES/mL YEAST (A)  Final   Report Status 06/28/2021 FINAL  Final  MRSA Next Gen by PCR, Nasal     Status: None   Collection Time: 06/30/21  5:01 AM   Specimen: Nasal Mucosa; Nasal Swab  Result Value Ref Range Status   MRSA by PCR Next Gen NOT DETECTED NOT DETECTED Final    Comment: (NOTE) The GeneXpert MRSA Assay (FDA approved for NASAL specimens only), is one component of a comprehensive MRSA colonization surveillance program. It is not intended to diagnose MRSA infection nor to guide or monitor treatment for MRSA infections. Test performance is not FDA approved in patients  less than 90 years old. Performed at [facility], [address] [place], [place] [address]     Coagulation Studies: No results for input(s): LABPROT, INR in the last 72 hours.   Urinalysis: No results for input(s): COLORURINE, LABSPEC, PHURINE, GLUCOSEU, HGBUR, BILIRUBINUR, KETONESUR, PROTEINUR, UROBILINOGEN, NITRITE, LEUKOCYTESUR in the last 72 hours.  Invalid input(s): APPERANCEUR     Imaging: CT ABDOMEN PELVIS WO CONTRAST  Result Date: 07/04/2021 CLINICAL DATA:  Fever, leukocytosis status post duodenal ulcer repair. EXAM: CT ABDOMEN AND PELVIS WITHOUT CONTRAST TECHNIQUE: Multidetector CT imaging of the abdomen and pelvis was performed following the standard protocol without IV contrast. COMPARISON:  June 29, 2021. FINDINGS: Lower chest: Mild bilateral posterior basilar subsegmental atelectasis is noted. Hepatobiliary: No focal liver abnormality is seen. No gallstones, gallbladder wall thickening, or biliary dilatation. Pancreas: Unremarkable. No pancreatic ductal dilatation or surrounding inflammatory changes. Spleen: Normal in size without focal abnormality. Adrenals/Urinary Tract: Adrenal glands appear normal. Bilateral nephrolithiasis is noted. Mild to moderate right hydroureteronephrosis is noted without obstructing calculus. Urinary bladder is decompressed secondary to Foley catheter. Mild left hydronephrosis is noted secondary to 14 mm calculus at the left ureteropelvic junction. The low density seen arising from the upper pole of the left kidney appears to be enlarged, currently measuring 7.0 x 4.2 cm. While this may represent neoplasm, the rapid increase in size suggests perinephric hematoma or abscess. Significantly increased perinephric stranding is noted anteriorly. Stomach/Bowel: Nasogastric tube is seen in the proximal stomach. Surgical drain is seen in the epigastric region between the stomach and inferior portion of the left hepatic lobe. The appendix appears  normal. Mildly dilated small bowel loops are noted most consistent with postoperative ileus. There is seen wall thickening involving the descending colon concerning for infectious or inflammatory colitis. Vascular/Lymphatic: Aortic atherosclerosis. No enlarged abdominal or pelvic lymph nodes. Reproductive: Uterus and bilateral adnexa are unremarkable. Other: No abdominal wall hernia or abnormality. No abdominopelvic ascites. Surgical drain is also seen in the pelvis without surrounding fluid collection. Musculoskeletal: No acute or significant osseous findings. IMPRESSION: Ill-defined low density arising from upper pole of left kidney appears to have significantly enlarged in size, currently measuring 7.0 x 4.2 cm. There is also noted significantly increased perinephric inflammation anteriorly in the left pararenal space, and these findings are concerning for abscess or hematoma. Further evaluation with CT scan of the abdomen with intravenous contrast is recommended. Bilateral nephrolithiasis is noted. Mild to moderate right hydroureteronephrosis is noted without obstructing calculus. Mild left  hydronephrosis is noted secondary to 14 mm calculus at the left ureteropelvic junction. Surgical drain is seen between the stomach and inferior portion of the left hepatic lobe, as well as another surgical drain seen within the pelvis. No definite abscess is seen in this area. Irregular wall thickening is seen involving the descending colon concerning for infectious or inflammatory colitis. Mild bilateral posterior basilar subsegmental atelectasis is noted. Aortic Atherosclerosis (ICD10-I70.0). Electronically Signed   By: Marijo Conception M.D.   On: 07/04/2021 15:03   NM Pulmonary Perfusion  Result Date: 07/03/2021 CLINICAL DATA:  Respiratory failure, recent diagnosis of pneumonia. Lower extremity Dopplers negative. EXAM: NUCLEAR MEDICINE PERFUSION LUNG SCAN TECHNIQUE: Perfusion images were obtained in multiple projections  after intravenous injection of radiopharmaceutical. Ventilation scans intentionally deferred if perfusion scan and chest x-ray adequate for interpretation during COVID 19 epidemic. RADIOPHARMACEUTICALS:  3.85 mCi Tc-75mMAA IV COMPARISON:  Radiograph 07/02/2021, and previous FINDINGS: Physiologic distribution of radiopharmaceutical throughout both lungs. IMPRESSION: Negative for pulmonary embolus. Electronically Signed   By: DLucrezia EuropeM.D.   On: 07/03/2021 12:32   DG Chest Port 1 View  Result Date: 07/04/2021 CLINICAL DATA:  Acute respiratory failure with hypoxia. EXAM: PORTABLE CHEST 1 VIEW COMPARISON:  July 02, 2021. FINDINGS: Stable cardiomegaly. Endotracheal and nasogastric tubes are unchanged in position. Bilateral internal jugular catheters are unchanged in position. No pneumothorax is noted. Stable bibasilar atelectasis is noted. Stable small left pleural effusion is noted. Bony thorax is unremarkable. IMPRESSION: Stable support apparatus. Stable bilateral lung opacities as described above. Electronically Signed   By: JMarijo ConceptionM.D.   On: 07/04/2021 07:30     Medications:    sodium chloride     dexmedetomidine (PRECEDEX) IV infusion Stopped (07/04/21 1050)   esmolol Stopped (07/04/21 1035)   fentaNYL infusion INTRAVENOUS Stopped (07/05/21 0745)   fluconazole (DIFLUCAN) IV Stopped (07/04/21 1830)   piperacillin-tazobactam (ZOSYN)  IV 2.25 g (07/05/21 08786   TPN ADULT (ION) 65 mL/hr at 07/05/21 0516    chlorhexidine gluconate (MEDLINE KIT)  15 mL Mouth Rinse BID   Chlorhexidine Gluconate Cloth  6 each Topical Q0600   feeding supplement (PIVOT 1.5 CAL)  1,000 mL Per Tube Q24H   free water  30 mL Per Tube Q4H   heparin  5,000 Units Subcutaneous Q8H   insulin aspart  0-20 Units Subcutaneous Q4H   insulin detemir  20 Units Subcutaneous BID   mouth rinse  15 mL Mouth Rinse 10 times per day   pantoprazole (PROTONIX) IV  40 mg Intravenous Q12H     Assessment/ Plan:  [name] is a 75y.o.  female who has been admitted for community acquired pneumonia.    Acute Kidney Injury on chronic kidney disease stage 4 with baseline creatinine 1.97 and GFR of 25 on 12/22/19.  Acute kidney injury secondary to sepsis and obstructive uropathy Chronic kidney disease is secondary to diabetic nephropathy Continue holding losartan and metformin  -  S creatinine trends worse today  -Electrolytes and Volume status are acceptable No acute indication for Dialysis at present   Lab Results  Component Value Date   CREATININE 3.79 (H) 07/05/2021   CREATININE 3.67 (H) 07/04/2021   CREATININE 3.85 (H) 07/03/2021    Intake/Output Summary (Last 24 hours) at 07/05/2021 0809 Last data filed at 07/05/2021 0618 Gross per 24 hour  Intake 3538.5 ml  Output 1690 ml  Net 1848.5 ml    2. Acute Respiratory failure secondary to community  acquired pneumonia failing outpatient antibiotics.  Requiring mechanical ventilation.   3. Anemia of chronic kidney disease Normocytic Lab Results  Component Value Date   HGB 6.8 (L) 07/05/2021   Low threshold to initiate ESA Dose given 07/01/21  4.  Diabetes mellitus type II with chronic kidney disease insulin dependent. Most recent hemoglobin A1c is 8.3 on 06/12/21.  Metformin held    5.Bilateral Nephrolithiasis- with obstructive uropathy and left renal abscess Right - Mild to moderate chronic hydronephrosis without obstructing stone Left-mild left hydronephrosis with 14 mm stone at left UPJ- Stent placement in or today  Lesion at upper pole of left kidney enlarged - likely hematoma or abscess  6. S/p  duodenal perforation s/p repair on 06/30/2021 Getting TPN     LOS: 8 Jewel Mcafee 8/10/20228:09 AM

## 2021-07-05 NOTE — Consult Note (Signed)
Urology Consult  I have been asked to see the patient by Dr. Mortimer Fries, for evaluation and management of obstructing left UPJ stone with fevers and leukocytosis.  Chief Complaint: Fevers, leukocytosis  History of Present Illness: Heather [place] is a 75 y.o. year old female with PMH diabetes, CKD, nephrolithiasis, and chronic right hydroureteronephrosis admitted on 06/27/2021 with respiratory distress concerning for pneumonia, acute on chronic renal failure, and sepsis with subsequent findings of pneumoperitoneum.  She has now POD 5 from ex lap with primary repair of a perforated duodenal ulcer and creation of omental flap with Dr. Hampton Abbot.  She has undergone hemodialysis as needed during her hospitalization per nephrology.  Postoperatively, she was found to have persistent leukocytosis and intermittent fevers and so she underwent repeat CT AP without contrast yesterday, which redemonstrated a 14 mm left UPJ stone with mild left hydronephrosis and left upper pole renal lesion concerning for possible hematoma or abscess with significant surrounding perinephric stranding.  Her right hydroureteronephrosis appears stable. Notably, her obstructing stone was seen on prior CT dated 06/29/2021, however her left renal lesion was significantly smaller in size and felt more likely to represent possible RCC at that time.  Creatinine stably elevated today, 3.79 (baseline 1.5); WBC count down today, 20.6; hemoglobin down today, 6.8.  Admission UA was notable for >50 WBCs/hpf, >50 RBCs/hpf, rare bacteria, WBC clumps, budding yeast, and no nitrites. Admission urine culture finalized with 30k colonies/mL yeast. On antibiotics as below.  Patient remains critically ill requiring intubation and sedation in the ICU.  She is unable to contribute to HPI today.  I was able to speak to her husband at the bedside today, who reports she does have a history of nephrolithiasis requiring intervention in the past.  It seems she may have undergone  ESWL x2 in [place] most recently approximately 14 years ago.  He reports she has had a more recent history of urinary frequency and urgency, chronic.  Patient has been seen in our clinic in the past, most recently by Dr. Bernardo Heater on 12/30/2019 for cystoscopy stent removal after undergoing cystoscopy with bladder biopsy, right ureteroscopy, and right ureteral stent placement for further evaluation and management of her right hydronephrosis.  Intraoperatively, she was found to have an erythematous bladder and extensive collection of sediment noted within the right renal collecting system.  Right retrograde pyelogram at the time revealed nonobstructive appearing right hydroureteronephrosis.  Surgical pathology at the time revealed follicular cystitis and negative for malignancy.  Anti-infectives (From admission, onward)    Start     Dose/Rate Route Frequency Ordered Stop   07/01/21 1800  fluconazole (DIFLUCAN) IVPB 200 mg        200 mg 100 mL/hr over 60 Minutes Intravenous Every 24 hours 06/30/21 1328     06/30/21 1800  fluconazole (DIFLUCAN) IVPB 400 mg        400 mg 100 mL/hr over 120 Minutes Intravenous  Once 06/30/21 1328 06/30/21 2205   06/30/21 0500  ceFAZolin (ANCEF) IVPB 1 g/50 mL premix  Status:  Discontinued       Note to Pharmacy: To be given in specials   1 g 100 mL/hr over 30 Minutes Intravenous  Once 06/29/21 0703 06/29/21 1637   06/30/21 0430  fluconazole (DIFLUCAN) IVPB 400 mg  Status:  Discontinued        400 mg 100 mL/hr over 120 Minutes Intravenous Every 24 hours 06/30/21 0337 06/30/21 1326   06/29/21 2330  piperacillin-tazobactam (ZOSYN) IVPB 2.25 g  2.25 g 100 mL/hr over 30 Minutes Intravenous Every 8 hours 06/29/21 2240     06/29/21 1236  ceFAZolin (ANCEF) 1-4 GM/50ML-% IVPB       Note to Pharmacy: Corlis Hove   : cabinet override      06/29/21 1236 06/30/21 0044   06/28/21 1600  fluconazole (DIFLUCAN) tablet 150 mg        150 mg Oral  Once 06/28/21 1507  06/28/21 1649   06/27/21 0145  cefTRIAXone (ROCEPHIN) 2 g in sodium chloride 0.9 % 100 mL IVPB  Status:  Discontinued        2 g 200 mL/hr over 30 Minutes Intravenous Every 24 hours 06/27/21 0137 06/29/21 2302   06/27/21 0145  azithromycin (ZITHROMAX) 500 mg in sodium chloride 0.9 % 250 mL IVPB  Status:  Discontinued        500 mg 250 mL/hr over 60 Minutes Intravenous Every 24 hours 06/27/21 0137 06/29/21 1138        Past Medical History:  Diagnosis Date   Anxiety    Cataract    Depression    Diabetes mellitus without complication ([place])    Gout    History of kidney stones    Hyperlipidemia    Hypertension    Vertigo     Past Surgical History:  Procedure Laterality Date   CATARACT EXTRACTION, BILATERAL Bilateral 2019   COLONOSCOPY WITH PROPOFOL N/A 08/19/2015   Procedure: COLONOSCOPY WITH PROPOFOL;  Surgeon: Lucilla Lame, MD;  Location: [facility];  Service: Endoscopy;  Laterality: N/A;  diabetic - insulin   CYSTOSCOPY W/ RETROGRADES Bilateral 12/22/2019   Procedure: CYSTOSCOPY WITH RETROGRADE PYELOGRAM;  Surgeon: Abbie Sons, MD;  Location: [facility];  Service: Urology;  Laterality: Bilateral;   CYSTOSCOPY WITH BIOPSY N/A 12/22/2019   Procedure: CYSTOSCOPY WITH bladder BIOPSY;  Surgeon: Abbie Sons, MD;  Location: [facility];  Service: Urology;  Laterality: N/A;   CYSTOSCOPY WITH STENT PLACEMENT Right 12/22/2019   Procedure: CYSTOSCOPY WITH STENT PLACEMENT;  Surgeon: Abbie Sons, MD;  Location: [facility];  Service: Urology;  Laterality: Right;   CYSTOSCOPY WITH URETEROSCOPY Bilateral 12/22/2019   Procedure: CYSTOSCOPY WITH URETEROSCOPY;  Surgeon: Abbie Sons, MD;  Location: [facility];  Service: Urology;  Laterality: Bilateral;   CYSTOSCOPY/URETEROSCOPY/HOLMIUM LASER/STENT PLACEMENT Right 12/22/2019   Procedure: CYSTOSCOPY/URETEROSCOPY/HOLMIUM LASER/STENT PLACEMENT;  Surgeon: Abbie Sons, MD;  Location: [facility];  Service: Urology;  Laterality: Right;    DIALYSIS/PERMA CATHETER INSERTION N/A 06/29/2021   Procedure: DIALYSIS/PERMA CATHETER INSERTION;  Surgeon: Algernon Huxley, MD;  Location: [facility];  Service: Cardiovascular;  Laterality: N/A;   EYE SURGERY     LAPAROTOMY N/A 06/29/2021   Procedure: EXPLORATORY LAPAROTOMY WITH REPAIR OF DUODENAL PERFORATION;  Surgeon: [name], MD;  Location: [facility];  Service: General;  Laterality: N/A;   POLYPECTOMY  08/19/2015   Procedure: POLYPECTOMY;  Surgeon: Lucilla Lame, MD;  Location: [facility];  Service: Endoscopy;;   TUBAL LIGATION  1992    Home Medications:  Current Meds  Medication Sig   amLODipine (NORVASC) 10 MG tablet Take 1 tablet (10 mg total) by mouth daily.   aspirin EC 81 MG tablet Take 81 mg by mouth daily.   atorvastatin (LIPITOR) 20 MG tablet Take 1 tablet (20 mg total) by mouth daily.   benzonatate (TESSALON) 200 MG capsule Take 200 mg by mouth 3 (three) times daily as needed.   buPROPion (WELLBUTRIN XL) 150 MG 24 hr tablet Take 1  tablet (150 mg total) by mouth daily.   insulin aspart (NOVOLOG FLEXPEN) 100 UNIT/ML FlexPen Use meal coverage 6 units with every meal plus correction scale three times daily up to total of 35 units per day.   Insulin Pen Needle 32G X 4 MM MISC Inject insulin pens up to 5 times daily   Lancets (FREESTYLE) lancets USE AS INSTRUCTED   LEVEMIR FLEXTOUCH 100 UNIT/ML Pen INJECT 50 UNITS IN THE MORNING AND 44 UNITS IN THE EVENING (Patient taking differently: Inject 52 Units into the skin 2 (two) times daily.)   levofloxacin (LEVAQUIN) 500 MG tablet Take 500 mg by mouth daily.   losartan (COZAAR) 50 MG tablet Take 1 tablet (50 mg total) by mouth daily.   metFORMIN (GLUCOPHAGE) 1000 MG tablet Take 1 tablet (1,000 mg total) by mouth 2 (two) times daily.   metoprolol tartrate (LOPRESSOR) 25 MG tablet Take 1 tablet (25 mg total) by mouth 2 (two) times daily.   Multiple Vitamin (MULTI-VITAMIN) tablet Take 1 tablet by mouth daily.    ondansetron  (ZOFRAN-ODT) 4 MG disintegrating tablet Take 4 mg by mouth every 8 (eight) hours as needed.   predniSONE (DELTASONE) 20 MG tablet Take 20 mg by mouth 2 (two) times daily.   TRULICITY 0.62 IR/4.8NI SOPN SMARTSIG:0.5 Milliliter(s) SUB-Q Once a Week    Allergies:  Allergies  Allergen Reactions   Contrast Media  [Iodinated Diagnostic Agents] Anaphylaxis   Iodine Swelling    (IV only) - angioedema    Family History  Problem Relation Age of Onset   Diabetes Father    Cancer Father        lung cancer   Diabetes Brother    Stroke Mother     Social History:  reports that she quit smoking about 49 years ago. Her smoking use included cigarettes. She has a 4.50 pack-year smoking history. She has never used smokeless tobacco. She reports that she does not drink alcohol and does not use drugs.  ROS: A complete review of systems was performed.  All systems are negative except for pertinent findings as noted.  Physical Exam:  Vital signs in last 24 hours: Temp:  [99.68 F (37.6 C)-100.94 F (38.3 C)] 100.04 F (37.8 C) (08/10 0600) Pulse Rate:  [62-102] 95 (08/10 0600) Resp:  [12-17] 12 (08/10 0600) BP: (129-177)/(49-77) 142/51 (08/10 0600) SpO2:  [95 %-99 %] 96 % (08/10 0600) Arterial Line BP: (145-180)/(43-133) 174/47 (08/10 0500) FiO2 (%):  [30 %] 30 % (08/10 0756) Constitutional:  Sedated, ill appearing HEENT: [place] AT, moist mucus membranes Cardiovascular: No clubbing, cyanosis, or edema Respiratory: Intubated Skin: No rashes, bruises or suspicious lesions Neurologic: Sedated, unable to assess Psychiatric: Sedated, unable to assess  Laboratory Data:  Recent Labs    07/03/21 0509 07/04/21 0440 07/05/21 0443  WBC 28.9* 24.4* 20.6*  HGB 7.4* 7.3* 6.8*  HCT 22.3* 22.6* 21.8*   Recent Labs    07/03/21 0509 07/04/21 0440 07/05/21 0443  NA 141 145 146*  K 3.8 4.0 3.9  CL 107 110 113*  CO2 25 25 25   GLUCOSE 234* 209* 235*  BUN 98* 93* 104*  CREATININE 3.85* 3.67* 3.79*   CALCIUM 8.4* 8.6* 8.7*   Urinalysis    Component Value Date/Time   COLORURINE YELLOW (A) 06/27/2021 0239   APPEARANCEUR TURBID (A) 06/27/2021 0239   APPEARANCEUR Cloudy (A) 12/30/2019 1439   LABSPEC 1.012 06/27/2021 0239   LABSPEC 1.024 01/27/2014 1350   PHURINE 5.0 06/27/2021 0239   GLUCOSEU 50 (A)  06/27/2021 0239   GLUCOSEU >=500 01/27/2014 1350   HGBUR LARGE (A) 06/27/2021 0239   BILIRUBINUR NEGATIVE 06/27/2021 0239   BILIRUBINUR Negative 12/30/2019 1439   BILIRUBINUR Negative 01/27/2014 1350   KETONESUR NEGATIVE 06/27/2021 0239   PROTEINUR 100 (A) 06/27/2021 0239   UROBILINOGEN 0.2 12/17/2017 1524   NITRITE NEGATIVE 06/27/2021 0239   LEUKOCYTESUR LARGE (A) 06/27/2021 0239   LEUKOCYTESUR 2+ 01/27/2014 1350   Results for orders placed or performed during the hospital encounter of 06/27/21  Resp Panel by RT-PCR (Flu A&B, Covid) Nasopharyngeal Swab     Status: None   Collection Time: 06/27/21 12:32 AM   Specimen: Nasopharyngeal Swab; Nasopharyngeal(NP) swabs in vial transport medium  Result Value Ref Range Status   SARS Coronavirus 2 by RT PCR NEGATIVE NEGATIVE Final    Comment: (NOTE) SARS-CoV-2 target nucleic acids are NOT DETECTED.  The SARS-CoV-2 RNA is generally detectable in upper respiratory specimens during the acute phase of infection. The lowest concentration of SARS-CoV-2 viral copies this assay can detect is 138 copies/mL. A negative result does not preclude SARS-Cov-2 infection and should not be used as the sole basis for treatment or other patient management decisions. A negative result may occur with  improper specimen collection/handling, submission of specimen other than nasopharyngeal swab, presence of viral mutation(s) within the areas targeted by this assay, and inadequate number of viral copies(<138 copies/mL). A negative result must be combined with clinical observations, patient history, and epidemiological information. The expected result is  Negative.  Fact Sheet for Patients:  EntrepreneurPulse.com.au  Fact Sheet for Healthcare Providers:  IncredibleEmployment.be  This test is no t yet approved or cleared by the [place] FDA and  has been authorized for detection and/or diagnosis of SARS-CoV-2 by FDA under an Emergency Use Authorization (EUA). This EUA will remain  in effect (meaning this test can be used) for the duration of the COVID-19 declaration under Section 564(b)(1) of the Act, 21 U.S.C.section 360bbb-3(b)(1), unless the authorization is terminated  or revoked sooner.       Influenza A by PCR NEGATIVE NEGATIVE Final   Influenza B by PCR NEGATIVE NEGATIVE Final    Comment: (NOTE) The Xpert Xpress SARS-CoV-2/FLU/RSV plus assay is intended as an aid in the diagnosis of influenza from Nasopharyngeal swab specimens and should not be used as a sole basis for treatment. Nasal washings and aspirates are unacceptable for Xpert Xpress SARS-CoV-2/FLU/RSV testing.  Fact Sheet for Patients: EntrepreneurPulse.com.au  Fact Sheet for Healthcare Providers: IncredibleEmployment.be  This test is not yet approved or cleared by the [place] FDA and has been authorized for detection and/or diagnosis of SARS-CoV-2 by FDA under an Emergency Use Authorization (EUA). This EUA will remain in effect (meaning this test can be used) for the duration of the COVID-19 declaration under Section 564(b)(1) of the Act, 21 U.S.C. section 360bbb-3(b)(1), unless the authorization is terminated or revoked.  Performed at [facility], [address] [place], [place] [address]   Blood culture (routine x 2)     Status: None   Collection Time: 06/27/21 12:43 AM   Specimen: BLOOD  Result Value Ref Range Status   Specimen Description BLOOD RIGHT ASSIST CONTROL  Final   Special Requests   Final    BOTTLES DRAWN AEROBIC AND ANAEROBIC Blood Culture  results may not be optimal due to an inadequate volume of blood received in culture bottles   Culture   Final    NO GROWTH 5 DAYS Performed at [facility],  [place], [place] [address]    Report Status 07/02/2021 FINAL  Final  Blood culture (routine x 2)     Status: None   Collection Time: 06/27/21  1:45 AM   Specimen: BLOOD  Result Value Ref Range Status   Specimen Description BLOOD RIGHT ASSIST CONTROL  Final   Special Requests   Final    BOTTLES DRAWN AEROBIC AND ANAEROBIC Blood Culture adequate volume   Culture   Final    NO GROWTH 5 DAYS Performed at [facility], [address] [place], [place] [address]    Report Status 07/02/2021 FINAL  Final  Urine Culture     Status: Abnormal   Collection Time: 06/27/21  2:39 AM   Specimen: In/Out Cath Urine  Result Value Ref Range Status   Specimen Description   Final    IN/OUT CATH URINE Performed at [facility], [address] [place], [place] [address]    Special Requests   Final    NONE Performed at [facility], [address] [place], [place] [address]    Culture 30,000 COLONIES/mL YEAST (A)  Final   Report Status 06/28/2021 FINAL  Final  MRSA Next Gen by PCR, Nasal     Status: None   Collection Time: 06/30/21  5:01 AM   Specimen: Nasal Mucosa; Nasal Swab  Result Value Ref Range Status   MRSA by PCR Next Gen NOT DETECTED NOT DETECTED Final    Comment: (NOTE) The GeneXpert MRSA Assay (FDA approved for NASAL specimens only), is one component of a comprehensive MRSA colonization surveillance program. It is not intended to diagnose MRSA infection nor to guide or monitor treatment for MRSA infections. Test performance is not FDA approved in patients less than 23 years old. Performed at [facility], [address], [place], [place] [address]     Radiologic Imaging: CT ABDOMEN PELVIS WO CONTRAST  Result Date: 07/04/2021 CLINICAL DATA:  Fever, leukocytosis  status post duodenal ulcer repair. EXAM: CT ABDOMEN AND PELVIS WITHOUT CONTRAST TECHNIQUE: Multidetector CT imaging of the abdomen and pelvis was performed following the standard protocol without IV contrast. COMPARISON:  June 29, 2021. FINDINGS: Lower chest: Mild bilateral posterior basilar subsegmental atelectasis is noted. Hepatobiliary: No focal liver abnormality is seen. No gallstones, gallbladder wall thickening, or biliary dilatation. Pancreas: Unremarkable. No pancreatic ductal dilatation or surrounding inflammatory changes. Spleen: Normal in size without focal abnormality. Adrenals/Urinary Tract: Adrenal glands appear normal. Bilateral nephrolithiasis is noted. Mild to moderate right hydroureteronephrosis is noted without obstructing calculus. Urinary bladder is decompressed secondary to Foley catheter. Mild left hydronephrosis is noted secondary to 14 mm calculus at the left ureteropelvic junction. The low density seen arising from the upper pole of the left kidney appears to be enlarged, currently measuring 7.0 x 4.2 cm. While this may represent neoplasm, the rapid increase in size suggests perinephric hematoma or abscess. Significantly increased perinephric stranding is noted anteriorly. Stomach/Bowel: Nasogastric tube is seen in the proximal stomach. Surgical drain is seen in the epigastric region between the stomach and inferior portion of the left hepatic lobe. The appendix appears normal. Mildly dilated small bowel loops are noted most consistent with postoperative ileus. There is seen wall thickening involving the descending colon concerning for infectious or inflammatory colitis. Vascular/Lymphatic: Aortic atherosclerosis. No enlarged abdominal or pelvic lymph nodes. Reproductive: Uterus and bilateral adnexa are unremarkable. Other: No abdominal wall hernia or abnormality. No abdominopelvic ascites. Surgical drain is also seen in the pelvis without  surrounding fluid collection. Musculoskeletal: No  acute or significant osseous findings. IMPRESSION: Ill-defined low density arising from upper pole of left kidney appears to have significantly enlarged in size, currently measuring 7.0 x 4.2 cm. There is also noted significantly increased perinephric inflammation anteriorly in the left pararenal space, and these findings are concerning for abscess or hematoma. Further evaluation with CT scan of the abdomen with intravenous contrast is recommended. Bilateral nephrolithiasis is noted. Mild to moderate right hydroureteronephrosis is noted without obstructing calculus. Mild left hydronephrosis is noted secondary to 14 mm calculus at the left ureteropelvic junction. Surgical drain is seen between the stomach and inferior portion of the left hepatic lobe, as well as another surgical drain seen within the pelvis. No definite abscess is seen in this area. Irregular wall thickening is seen involving the descending colon concerning for infectious or inflammatory colitis. Mild bilateral posterior basilar subsegmental atelectasis is noted. Aortic Atherosclerosis (ICD10-I70.0). Electronically Signed   By: Marijo Conception M.D.   On: 07/04/2021 15:03   Assessment & Plan:  75 year old female with PMH nephrolithiasis and chronic right hydroureteronephrosis admitted with acute respiratory failure due to CAP, acute on chronic renal failure, sepsis due to perforated duodenal ulcer s/p repair, and with imaging findings of a 14 mm obstructing left UPJ stone with a left upper pole renal lesion expanding in size concerning for likely abscess.  I had a lengthy conversation with the patient's husband at the bedside today.  I explained that with our imaging findings of obstructive urolithiasis and evidence of infection with leukocytosis, intermittent fevers, and likely left renal abscess, we recommend urgent left ureteral stent placement for urinary decompression and source control of her urinary infection.  He is understandably  concerned about another procedure given the severity of her current condition.  We discussed that the risks of not proceeding include worsened infection leading to worsening sepsis or death.  We discussed that she would require outpatient follow-up after completion of antibiotic therapy for ureteroscopy with laser lithotripsy and stent exchange, and he is in agreement with this plan.  Plan: -Urgent left ureteral stent placement and right retrograde pyelogram with Dr. Bernardo Heater today -Please hold tube feeds preoperatively -Continue empiric antibiotics, Dr. Bernardo Heater to obtain intraoperative urine culture, though I suspect this will be negative given other admission antibiotics -Outpatient ureteroscopy with laser lithotripsy and stent exchange in 2 to 4 weeks -Consider repeat renal imaging if she fails to clinically improve within 72 hours; may consider IR involvement for percutaneous drainage if abscess organizes further -Outpatient renal imaging in 4 to 6 weeks to prove resolution of likely left renal abscess  Thank you for involving me in this patient's care, I will continue to follow along.  Debroah Loop, PA-C 07/05/2021 9:09 AM

## 2021-07-05 NOTE — Progress Notes (Signed)
GOALS OF CARE DISCUSSION  The Clinical status was relayed to family in detail.  Updated and notified of patients medical condition.    Patient remains unresponsive and will not open eyes to command.   Patient is having a weak cough and struggling to remove secretions.   Patient with increased WOB and using accessory muscles to breathe Explained to family course of therapy and the modalities  CT abd shows obstructive Kidney Stone per Urology, back to OR today    [place] understands the situation.   Family are satisfied with Plan of action and management. All questions answered  Additional CC time 25 mins   Heather [place] Patricia Pesa, M.D.  [facility]  Medical Director [place] Director [facility] Cardio-Pulmonary Department

## 2021-07-05 NOTE — Transfer of Care (Signed)
Immediate Anesthesia Transfer of Care Note  Patient: Heather [place]  Procedure(s) Performed: CYSTOSCOPY WITH RETROGRADE PYELOGRAM/URETERAL STENT PLACEMENT-POSSIBLE RIGHT (Bilateral)  Patient Location: PACU and SICU  Anesthesia Type:General  Level of Consciousness: Patient remains intubated per anesthesia plan  Airway & Oxygen Therapy: Patient remains intubated per anesthesia plan and Patient placed on Ventilator (see vital sign flow sheet for setting)  Post-op Assessment: Report given to RN  Post vital signs: stable  Last Vitals:  Vitals Value Taken Time  BP    Temp    Pulse    Resp    SpO2      Last Pain:  Vitals:   07/05/21 0800  TempSrc: Bladder  PainSc:       Patients Stated Pain Goal: 0 (07/68/08 8110)  Complications: No notable events documented.

## 2021-07-05 NOTE — Progress Notes (Signed)
07/05/2021  Subjective: Patient is POD#6 s/p exlap and primary repair of perforated duodenal ulcer.  She had CT scan yesterday due to elevated WBC and fevers and to evaluate the repair site.  There was no contrast extravasation and no concerning findings in the area of repair, but she does have left obstructing kidney stone resulting in worsening perirenal stranding and hydronephrosis.  Urology has been consulted and she's doing to OR today for cystoscopy and left ureteral stent placement.  She was otherwise started on trickle tube feeds last night, which she is tolerating well, and is more awake now and working better with the ventilator and SBT.  Continued having low grade temps and had tmax of 101.1.  WBC is down to 20.6.  Vital signs: Temp:  [99.68 F (37.6 C)-101.1 F (38.4 C)] 101.1 F (38.4 C) (08/10 0900) Pulse Rate:  [62-106] 103 (08/10 0900) Resp:  [12-21] 15 (08/10 0900) BP: (133-177)/(49-77) 149/73 (08/10 0800) SpO2:  [91 %-97 %] 93 % (08/10 0900) Arterial Line BP: (145-180)/(43-133) 165/48 (08/10 0900) FiO2 (%):  [30 %] 30 % (08/10 0800)   Intake/Output: 08/09 0701 - 08/10 0700 In: 3538.5 [I.V.:2342.5; NG/GT:46; IV Piggyback:250] Out: 4098 [Urine:1600; Drains:90] Last BM Date: 06/29/21  Physical Exam: Constitutional: No acute distress, on ventilator. Cardiac:  Regular rhythm and rate Pulm:  Normal work of breathing, on ventilator, with improved O2 requirements. Abdomen:  soft, obese, non-distended.  Midline incision with dressing clean dry intact.  Two blake drains with serous fluid drainage.  Labs:  Recent Labs    07/04/21 0440 07/05/21 0443  WBC 24.4* 20.6*  HGB 7.3* 6.8*  HCT 22.6* 21.8*  PLT 145* 161   Recent Labs    07/04/21 0440 07/05/21 0443  NA 145 146*  K 4.0 3.9  CL 110 113*  CO2 25 25  GLUCOSE 209* 235*  BUN 93* 104*  CREATININE 3.67* 3.79*  CALCIUM 8.6* 8.7*   No results for input(s): LABPROT, INR in the last 72 hours.  Imaging: CT  ABDOMEN PELVIS WO CONTRAST  Result Date: 07/04/2021 CLINICAL DATA:  Fever, leukocytosis status post duodenal ulcer repair. EXAM: CT ABDOMEN AND PELVIS WITHOUT CONTRAST TECHNIQUE: Multidetector CT imaging of the abdomen and pelvis was performed following the standard protocol without IV contrast. COMPARISON:  June 29, 2021. FINDINGS: Lower chest: Mild bilateral posterior basilar subsegmental atelectasis is noted. Hepatobiliary: No focal liver abnormality is seen. No gallstones, gallbladder wall thickening, or biliary dilatation. Pancreas: Unremarkable. No pancreatic ductal dilatation or surrounding inflammatory changes. Spleen: Normal in size without focal abnormality. Adrenals/Urinary Tract: Adrenal glands appear normal. Bilateral nephrolithiasis is noted. Mild to moderate right hydroureteronephrosis is noted without obstructing calculus. Urinary bladder is decompressed secondary to Foley catheter. Mild left hydronephrosis is noted secondary to 14 mm calculus at the left ureteropelvic junction. The low density seen arising from the upper pole of the left kidney appears to be enlarged, currently measuring 7.0 x 4.2 cm. While this may represent neoplasm, the rapid increase in size suggests perinephric hematoma or abscess. Significantly increased perinephric stranding is noted anteriorly. Stomach/Bowel: Nasogastric tube is seen in the proximal stomach. Surgical drain is seen in the epigastric region between the stomach and inferior portion of the left hepatic lobe. The appendix appears normal. Mildly dilated small bowel loops are noted most consistent with postoperative ileus. There is seen wall thickening involving the descending colon concerning for infectious or inflammatory colitis. Vascular/Lymphatic: Aortic atherosclerosis. No enlarged abdominal or pelvic lymph nodes. Reproductive: Uterus and bilateral adnexa are  unremarkable. Other: No abdominal wall hernia or abnormality. No abdominopelvic ascites. Surgical  drain is also seen in the pelvis without surrounding fluid collection. Musculoskeletal: No acute or significant osseous findings. IMPRESSION: Ill-defined low density arising from upper pole of left kidney appears to have significantly enlarged in size, currently measuring 7.0 x 4.2 cm. There is also noted significantly increased perinephric inflammation anteriorly in the left pararenal space, and these findings are concerning for abscess or hematoma. Further evaluation with CT scan of the abdomen with intravenous contrast is recommended. Bilateral nephrolithiasis is noted. Mild to moderate right hydroureteronephrosis is noted without obstructing calculus. Mild left hydronephrosis is noted secondary to 14 mm calculus at the left ureteropelvic junction. Surgical drain is seen between the stomach and inferior portion of the left hepatic lobe, as well as another surgical drain seen within the pelvis. No definite abscess is seen in this area. Irregular wall thickening is seen involving the descending colon concerning for infectious or inflammatory colitis. Mild bilateral posterior basilar subsegmental atelectasis is noted. Aortic Atherosclerosis (ICD10-I70.0). Electronically Signed   By: Marijo Conception M.D.   On: 07/04/2021 15:03    Assessment/Plan: This is a 75 y.o. female s/p exlap and repair of perforated duodenal ulcer.  --From surgical standpoint, her repair is healing well and she can continue on tube feeds.  Would advance slowly but can advance as tolerated.  She's going for left ureteral stent placement today, but can resume TF afterwards.  Continue TPN until she's at goal on her TF. --Continue IV abx --Continue both surgical drains, monitor and record output.  Please let us know if there are any changes in the fluid drainage quality/color. --Discussed with husband at bedside.   Melvyn Neth, [facility]

## 2021-07-05 NOTE — Progress Notes (Signed)
Daily Progress Note   Patient Name: Heather Lopez       Date: 07/05/2021 DOB: [birth year]  Age: 75 y.o. MRN#: [id number] Attending Physician: Flora Lipps, MD Primary Care Physician: [name], PA-C Admit Date: 06/27/2021  Reason for Consultation/Follow-up: Establishing goals of care  Subjective: Patient is resting in bed on ventilator. No family at bedside. Plans in place for return to the OR, with nephrology team. Will continue to follow progress.   Length of Stay: 8  Current Medications: Scheduled Meds:   chlorhexidine gluconate (MEDLINE KIT)  15 mL Mouth Rinse BID   Chlorhexidine Gluconate Cloth  6 each Topical Q0600   feeding supplement (PIVOT 1.5 CAL)  1,000 mL Per Tube Q24H   feeding supplement (PROSource TF)  45 mL Per Tube TID   free water  30 mL Per Tube Q4H   heparin  5,000 Units Subcutaneous Q8H   insulin aspart  0-20 Units Subcutaneous Q4H   insulin aspart  3 Units Subcutaneous Q4H   insulin detemir  20 Units Subcutaneous BID   mouth rinse  15 mL Mouth Rinse 10 times per day   [START ON 07/08/2021] multivitamin  15 mL Per Tube Daily   [START ON 07/06/2021] nutrition supplement (JUVEN)  1 packet Per Tube BID BM   pantoprazole (PROTONIX) IV  40 mg Intravenous Q12H    Continuous Infusions:  sodium chloride     dexmedetomidine (PRECEDEX) IV infusion Stopped (07/04/21 1050)   esmolol Stopped (07/04/21 1035)   fentaNYL infusion INTRAVENOUS 175 mcg/hr (07/05/21 1034)   fluconazole (DIFLUCAN) IV Stopped (07/04/21 1830)   piperacillin-tazobactam (ZOSYN)  IV Stopped (07/05/21 4742)   TPN ADULT (ION) 65 mL/hr at 07/05/21 0516   TPN ADULT (ION)      PRN Meds: sodium chloride, acetaminophen, alteplase, fentaNYL, heparin, labetalol, lidocaine (PF), lidocaine-prilocaine,  midazolam, ondansetron (ZOFRAN) IV, pentafluoroprop-tetrafluoroeth  Physical Exam Constitutional:      Comments: Eyes closed. On ventilator.             Vital Signs: BP (!) 149/73 (BP Location: Left Arm)   Pulse (!) 103   Temp (!) 101.1 F (38.4 C)   Resp 15   Ht 5' 2.01" (1.575 m)   Wt 102.9 kg   SpO2 93%   BMI 41.48 kg/m  SpO2: SpO2: 93 % O2 Device:  O2 Device: Ventilator O2 Flow Rate: O2 Flow Rate (L/min): 10 L/min  Intake/output summary:  Intake/Output Summary (Last 24 hours) at 07/05/2021 1250 Last data filed at 07/05/2021 1034 Gross per 24 hour  Intake 3125.17 ml  Output 1690 ml  Net 1435.17 ml   LBM: Last BM Date: 06/29/21 Baseline Weight: Weight: 99.8 kg Most recent weight: Weight: 102.9 kg         Patient Active Problem List   Diagnosis Date Noted   Pressure injury of skin 06/30/2021   Pneumoperitoneum    Acute respiratory failure with hypoxemia ([place]) 06/27/2021   ARF (acute renal failure) ([place]) 06/27/2021   Severe sepsis with septic shock ([place]) 06/27/2021   Acute lower UTI 06/27/2021   Acute renal failure (ARF) ([place]) 06/27/2021   Non-proliferative diabetic retinopathy, mild, both eyes ([place]) 03/03/2018   Vitamin D deficiency 08/21/2016   IBS (irritable bowel syndrome) 08/21/2016   Special screening for malignant neoplasms, colon    Benign neoplasm of transverse colon    Benign neoplasm of sigmoid colon    Uncontrolled type 2 diabetes mellitus with insulin therapy ([place]) 07/12/2015   Benign hypertension with CKD (chronic kidney disease) stage III ([place]) 07/12/2015   Hyperlipidemia associated with type 2 diabetes mellitus ([place]) 07/12/2015   Depression 07/12/2015   BMI 40.0-44.9, adult ([place]) 07/12/2015    Palliative Care Assessment & Plan     Recommendations/Plan: Full code/full scope PMT will follow to see how she does after OR.   Code Status:    Code Status Orders  (From admission, onward)           Start     Ordered   06/27/21 0445   Full code  Continuous        06/27/21 0445           Code Status History     This patient has a current code status but no historical code status.      Advance Directive Documentation    Flowsheet Row Most Recent Value  Type of Advance Directive Healthcare Power of Attorney  Pre-existing out of facility DNR order (yellow form or pink MOST form) --  "MOST" Form in Place? --       Thank you for allowing the Palliative Medicine Team to assist in the care of this patient.       Total Time 15 min Prolonged Time Billed  no       Greater than 50%  of this time was spent counseling and coordinating care related to the above assessment and plan.  Asencion Gowda, NP  Please contact Palliative Medicine Team phone at [phone] for questions and concerns.

## 2021-07-05 NOTE — Consult Note (Signed)
Pharmacy Antibiotic Note  VERNIS CABACUNGAN is a 75 y.o. female with medical history including HTN, diabetes, HLD, anxiety/depression, gout who is admitted on 06/27/2021 with  acute renal failure, acute respiratory failure secondary to pneumonia .  Hospital course complicated by perforated duodenal ulcer and patient was emergently taken to OR 8/5 for ExLap, repair of perforated duodenal ulcer with creation of omental flap. Post-operatively, patient admitted to the ICU where she remains intubated, sedated, and on mechanical ventilation. Pharmacy has been consulted for Zosyn and fluconazole dosing for intra-abdominal infection.  Patient no longer receiving routine HD, no acute need for HD Worsening leukocytosis and fever, CT with enlarging renal abscess for hematoma, + obstructing urteral stone s/p OR 8/10 for cystoscopy and stenting 8/2 I/O urine cx with Yeast 8/10 urine cx during cystoscopy pending 8/2 Bcx No growth Renal: SCr 3.79, CrCl < 20 but good UOP  Plan: Change piperacillin/tazobactam to 2.25gm IV q6h(from q8h) for good UOP and CrCl < 20 Continue fluconazole 200mg  q24h --Continue to monitor for DDIs --Continue to follow renal function  Height: 5' 2.01" (157.5 cm) Weight: 102.9 kg (226 lb 13.7 oz) IBW/kg (Calculated) : 50.12  Temp (24hrs), Avg:100 F (37.8 C), Min:98.06 F (36.7 C), Max:101.1 F (38.4 C)  Recent Labs  Lab 06/30/21 0320 07/01/21 0508 07/02/21 0508 07/03/21 0509 07/04/21 0440 07/05/21 0443  WBC 30.3* 30.3* 23.2* 28.9* 24.4* 20.6*  CREATININE 6.20* 5.40* 4.01* 3.85* 3.67* 3.79*  LATICACIDVEN 1.3  --   --   --   --   --      Estimated Creatinine Clearance: 14.6 mL/min (A) (by C-G formula based on SCr of 3.79 mg/dL (H)).    Allergies  Allergen Reactions   Contrast Media  [Iodinated Diagnostic Agents] Anaphylaxis   Iodine Swelling    (IV only) - angioedema    Antimicrobials this admission: Azithromycin 8/2 >> 8/4 Ceftriaxone 8/2 >> 8/4 Zosyn 8/4 >>   Diflucan 8/5 >>   Dose adjustments this admission: N/A  Microbiology results: 8/2 BCx: NGTD 8/2 UCx: 30k colonies/mL Yeast  8/5 MRSA PCR: (-)  Thank you for allowing pharmacy to be a part of this patient's care.  Doreene Eland, PharmD, BCPS.   Work Cell: [phone] 07/05/2021 4:04 PM

## 2021-07-05 NOTE — Consult Note (Signed)
PHARMACY - TOTAL PARENTERAL NUTRITION CONSULT NOTE   Indication:  Prolonged NPO  Patient Measurements: Height: 5' 2.01" (157.5 cm) Weight: 102.9 kg (226 lb 13.7 oz) IBW/kg (Calculated) : 50.12 TPN AdjBW (KG): 62.5 Body mass index is 41.48 kg/m.  Assessment:  Patient is a [name] with medical history including HTN, diabetes, HLD, anxiety / depression, gout who was admitted 8/2 with acute renal failure, acute respiratory failure secondary to pneumonia. Hospital course complicated by perforated duodenal ulcer and patient was emergently taken to OR 8/5 for ExLap, repair of perforated duodenal ulcer with creation of omental flap. Post-operatively, patient admitted to the ICU where she remains intubated, sedated, and on mechanical ventilation. Pharmacy has been consulted to initiate TPN for prolonged NPO status.   Glucose / Insulin: Last A1c 8.3%.  24 hr glucose range 212 - 266 24 hr SSI requirements 54 units Electrolytes: Within normal limits. Hyperphosphatemia resolved Renal: Nephrology following for iHD. Scr down-trending Hepatic: Within normal limits Intake / Output; MIVF: Net negative 2.2 L for the admission GI Imaging: 8/4 CT Abdomen / pelvis: Extensive pneumoperitoneum compatible with perforated viscus. Large ill-defined mass arising from the upper pole left kidney consistent with renal cell carcinoma.  Acute left-sided hydronephrosis due to a 12 mm left UPJ calculus. Chronic right hydronephrosis and hydroureter without clear etiology. GI Surgeries / Procedures:  8/5: ExLap with repair of perforated duodenal ulcer with creation of omental flap  Central access: 8/5  TPN start date:  8/5  Nutritional Goals (per RD recommendation on 8/5): kCal: 1150 - 1450 kcal/day, Protein: 100 - 125 g/day, Fluid: 1.2 - 1.5 L/day  Goal TPN rate is 65 mL/hr (provides 101 g of protein and 1379 kcals per day)  Current Nutrition:  NPO  Plan:  --Patient started on tube feeds 8/9. If patient  continues to tolerate, plan to increase tube feeds to goal and taper off TPN in the next 24-48 hrs. --Continue TPN at 65 mL/hr Amino acids: 101.4 g Dextrose: 171.6 g Lipids: 39 g Kcal: 1379 Fluid: 1660 mL (including overfill) --Electrolytes in TPN: Na 72mEq/L, K 2mEq/L, Ca 30mEq/L, Mg 12mEq/L, and Phos 68mmol/L. Cl:Ac 1:1 --Add standard MVI and trace elements to TPN --Continue rSSI + Levemir 20 units BID. Increase insulin in TPN to 70 units. Add Novolog 3 units q4h. --Monitor TPN labs on Mon/Thurs at minimum  Tawnya Crook, PharmD, BCPS Clinical Pharmacist 07/05/2021 11:32 AM

## 2021-07-05 NOTE — Op Note (Signed)
Preoperative diagnosis:  Left UPJ calculus with hydronephrosis and suspected sepsis from a urinary source Chronic right hydronephrosis/hydroureter-nonobstructing  Postoperative diagnosis:  Left UPJ calculus with obstruction Left pyonephrosis Right hydronephrosis without evidence of obstruction  Procedure: Cystoscopy Bilateral ureteral stent placement (78F/24 cm)  Bilateral retrograde pyelography with interpretation   Surgeon: Nicki Reaper C. Quanetta Truss, M.D.  Anesthesia: General  Complications: None  Intraoperative findings: Cystoscopy: Patchy bladder erythema endoscopically consistent with inflammatory change.  Cloudy urine drained on cystoscope insertion.  UOs normal-appearing bilaterally Left retrograde pyelogram: Moderate left hydronephrosis secondary to obstructing UPJ stone.  Thick, purulent urine aspirated from the left renal pelvis on ureteral catheter placement Right retrograde pyelogram: Moderate hydronephrosis/hydroureter without evidence of obstruction.  Ureteral catheter inserted to renal pelvis and cloudy urine was aspirated  EBL: Minimal  Specimens: Urine left renal pelvis for culture Urine right renal pelvis for culture  Indication: Heather Lopez is a 75 y.o. patient with recent ex lap for sepsis secondary to perforated duodenal ulcer status postrepair.  Persistent fever and leukocytosis and CT remarkable for an obstructing left UPJ calculus with significant perinephric stranding and left upper pole mass representing questionable abscess.  Left ureteral stent placement recommended.  She also has a history of chronic right hydronephrosis that was nonobstructive and right retrograde pyelogram also planned.  The procedure was discussed with the patient's husband and informed consent has been obtained.  Description of procedure:  The patient was taken to the operating room and general anesthesia was induced.  The patient was placed in the dorsal lithotomy position, prepped and draped  in the usual sterile fashion, and preoperative antibiotics were administered. A preoperative time-out was performed.   A 21 French cystoscope was lubricated and passed per urethra.  Panendoscopy was performed with findings as described above.  A 5 French open-ended ureteral catheter was placed through the cystoscope and positioned at the left ureteral orifice.  A 0.038 Sensor wire was placed through the catheter and advanced up the ureter under fluoroscopic guidance.  The guidewire could not be negotiated past the left UPJ calculus.  A 0.038 Glidewire was then placed to the ureteral catheter and was able to be advanced into the renal pelvis above the stone.  The ureteral catheter was advanced over the Glidewire.  Resistance was met at the level of stone however the catheter was able to be advanced into the renal pelvis.  The Glidewire was removed and thick purulent urine was aspirated and sent for culture. Retrograde pyelogram was then performed through the ureteral catheter with findings as described above.  The Sensor wire was then placed through the ureteral catheter into the renal pelvis and the ureteral catheter was removed.  A 78F/24 cm Contour ureteral stent was then placed with a curl noted in the renal pelvis under fluoroscopy and in the bladder under direct vision.  Attention was then directed to the right ureteral orifice.  The Sensor wire was placed through the ureteral catheter in a similar fashion and advanced to the renal pelvis under fluoroscopic guidance in a similar fashion.  The ureteral catheter was advanced over the wire and the guidewire was removed.  Urine from the right renal pelvis was cloudy and was also sent for culture.  Retrograde pyelogram was then performed with findings as described above.  Although there did not appear to be obstruction present based on the appearance of her urine from the renal pelvis it was elected to place a right ureteral stent.  The guidewire was  replaced in  the ureteral catheter was removed.  A 53F/24 cm Contour ureteral stent was also placed on the right side with good positioning noted.  A 16 French Foley catheter was placed and the balloon inflated with 10 cc of sterile water.  Cath was placed to gravity drainage.  After anesthetic reversal she was transported back to the ICU under guarded condition.  Recommendation: If no clinical improvement over the next 24-48 hours recommend reimaging of the left kidney for further evaluation of possible intrarenal abscess   John Giovanni, MD

## 2021-07-05 NOTE — Anesthesia Preprocedure Evaluation (Signed)
Anesthesia Evaluation  Patient identified by MRN, date of birth, ID band Patient unresponsive  General Assessment Comment:  S/p ex-lap 06/30/21 for perforated duodenum. Remains intubated, on 30% FiO2, with saturations ~94%. In acute renal failure, needing a ureteral stent  Reviewed: Allergy & Precautions, NPO status , Patient's Chart, lab work & pertinent test results, Unable to perform ROS - Chart review only  History of Anesthesia Complications Negative for: history of anesthetic complications  Airway Mallampati: Intubated  TM Distance: >3 FB     Dental  (+) Teeth Intact   Pulmonary neg pulmonary ROS, neg sleep apnea, neg pneumonia , neg COPD, Patient abstained from smoking.Not current smoker, former smoker,     + decreased breath sounds      Cardiovascular Exercise Tolerance: Good METShypertension, (-) CAD and (-) Past MI (-) dysrhythmias  Rhythm:Regular Rate:Tachycardia - Systolic murmurs TTE: 1. Left ventricular ejection fraction, by estimation, is 55 to 60%. The  left ventricle has normal function. Left ventricular endocardial border  not optimally defined to evaluate regional wall motion. There is mild left  ventricular hypertrophy. Left  ventricular diastolic parameters were normal.  2. Right ventricular systolic function was not well visualized. The right  ventricular size is normal. Tricuspid regurgitation signal is inadequate  for assessing PA pressure.  3. Left atrial size was moderately dilated.  4. The mitral valve is abnormal. No evidence of mitral valve  regurgitation. No evidence of mitral stenosis.  5. The aortic valve is tricuspid. There is mild calcification of the  aortic valve. There is mild thickening of the aortic valve. Aortic valve  regurgitation is not visualized. Mild to moderate aortic valve  sclerosis/calcification is present, without any  evidence of aortic stenosis.    Neuro/Psych PSYCHIATRIC  DISORDERS Anxiety Depression negative neurological ROS     GI/Hepatic neg GERD  ,(+)     (-) substance abuse  ,   Endo/Other  diabetes  Renal/GU ARFRenal disease     Musculoskeletal   Abdominal   Peds  Hematology   Anesthesia Other Findings Past Medical History: No date: Anxiety No date: Cataract No date: Depression No date: Diabetes mellitus without complication (HCC) No date: Gout No date: History of kidney stones No date: Hyperlipidemia No date: Hypertension No date: Vertigo  Reproductive/Obstetrics                            Anesthesia Physical Anesthesia Plan  ASA: 4 and emergent  Anesthesia Plan: General   Post-op Pain Management:    Induction: Intravenous  PONV Risk Score and Plan: 4 or greater and Ondansetron, Dexamethasone and Treatment may vary due to age or medical condition  Airway Management Planned: Oral ETT  Additional Equipment:   Intra-op Plan:   Post-operative Plan: Post-operative intubation/ventilation  Informed Consent: I have reviewed the patients History and Physical, chart, labs and discussed the procedure including the risks, benefits and alternatives for the proposed anesthesia with the patient or authorized representative who has indicated his/her understanding and acceptance.     Dental advisory given and Consent reviewed with [place] Discussed with: CRNA and Surgeon  Anesthesia Plan Comments: (Patient's tube feeds turned off at [place]. Urologist declaring this case emergent and unable to wait 8 hours for NPO time.  Discussed risks of anesthesia with patient's husband at the bedside, including cardiorespiratory and neurological sequelae, and allergic reactions, and death. He understands.)        Anesthesia Quick Evaluation

## 2021-07-05 NOTE — Progress Notes (Signed)
Inpatient Diabetes Program Recommendations  AACE/ADA: New Consensus Statement on Inpatient Glycemic Control   Target Ranges:  Prepandial:   less than 140 mg/dL      Peak postprandial:   less than 180 mg/dL (1-2 hours)      Critically ill patients:  140 - 180 mg/dL   Results for Heather Lopez, Heather Lopez (MRN [id number]) as of 07/05/2021 10:28  Ref. Range 07/04/2021 08:00 07/04/2021 10:57 07/04/2021 15:04 07/04/2021 19:40 07/04/2021 23:37 07/05/2021 04:32 07/05/2021 08:02  Glucose-Capillary Latest Ref Range: 70 - 99 mg/dL 196 (H)  Novolog 4 units  Levemir 20 units 232 (H)  Novolog 7 units 266 (H)  Novolog 11 units 252 (H)  Novolog 11 units  Levemir 20 units 221 (H)  Novolog 7 units 212 (H)  Novolog 7 units 229 (H)  Novolog 7 units  Levemir 20 units    Review of Glycemic Control  Current orders for Inpatient glycemic control: Levemir 20 units BID, Novolog 0-20 units Q4H, Novolog 3 units Q4H; TPN @ 65 ml/hr with 50 units of insulin  Inpatient Diabetes Program Recommendations:    Insulin: Noted Novolog 3 units Q4H was ordered today. Please consider increasing insulin in TPN to 70 units.  Thanks, Barnie Alderman, RN, MSN, CDE Diabetes Coordinator Inpatient Diabetes Program [phone] (Team Pager from 8am to 5pm)

## 2021-07-05 NOTE — Progress Notes (Signed)
Nutrition Follow-up  DOCUMENTATION CODES:   Obesity unspecified  INTERVENTION:   TPN per pharmacy   Pivot 1.5'@40ml' /hr- Initiate at 46m/hr and increase by 138mhr q 12 hours as tolerated.   Pro-Source 4551mID via tube, provides 40kcal and 11g of protein per serving   Free water flushes 100m66m hours once TPN discontinued   Regimen provides 1560kcal/day, 123g/day protein and 1328ml88m free water   Liquid MVI daily via tube once TPN discontinued  Juven Fruit Punch BID, each serving provides 95kcal and 2.5g of protein (amino acids glutamine and arginine)  NUTRITION DIAGNOSIS:   Increased nutrient needs related to acute illness, post-op healing (sepsis, ARF on HD) as evidenced by estimated needs.  GOAL:   Provide needs based on ASPEN/SCCM guidelines -met with TPN  MONITOR:   Vent status, Labs, Weight trends, TF tolerance, Skin, I & O's  ASSESSMENT:   75 y.75 year old female with PMH of diabetes, CKD III, nephrolithiasis with chronic right hydroureteronephrosis, HLD, depression/anxiety, IBS and gout who is admitted with PNA, ARF and perforated duodenal ulcer now s/p exploratory laparotomy and primary repair of perforated duodenal ulcer with creation of omental flap 8/5.  Pt remains sedated and ventilated. Pt tolerating TPN well at goal rate. Pt with hyperglycemia, insulin added to TPN yesterday. Pt initiated on trickle tube feeds yesterday evening and has been tolerating well. Pt NPO today for cystoscopy and stent placement for obstructing left ureteral calculus. Pt having bowel sounds today. Will plan to restart tube feeds this evening; will advance to goal as tolerated and begin to wean TPN when appropriate. No BM since 8/4. Refeed labs are stable. No new weight since 8/5; will request daily weights. Pt is receiving HD as needed.   Medications reviewed and include: heparin, insulin, protonix, diflucan, zosyn  Labs reviewed: Na 146(H), K 3.9 wnl, BUN 104(H), creat 3.79(H), P  2.9 wnl, Mg 1.9 wnl Triglycerides- 116, pre-albumin 5.8(L)- 8/8 Wbc- 20.6(H), Hgb 6.8(L), Hct 21.8(L) Cbgs- 212, 229 x 24 hrs  Patient is currently intubated on ventilator support MV: 9.7 L/min Temp (24hrs), Avg:100.3 F (37.9 C), Min:99.68 F (37.6 C), Max:101.1 F (38.4 C)  Propofol: none   MAP- >65mmH87mUOP- 1600ml  68m Order:   Diet Order             Diet NPO time specified  Diet effective now                  EDUCATION NEEDS:   Not appropriate for education at this time  Skin:  Skin Assessment: Reviewed RN Assessment (Stage I sacrum, incision abdomen)  Last BM:  8/4 - type 6  Height:   Ht Readings from Last 1 Encounters:  07/03/21 5' 2.01" (1.575 m)    Weight:   Wt Readings from Last 1 Encounters:  06/30/21 102.9 kg    Ideal Body Weight:  50 kg  BMI:  Body mass index is 41.48 kg/m.  Estimated Nutritional Needs:   Kcal:  1150-1450 kcal/d  Protein:  100-125 g/d  Fluid:  1.3-1.5L/d  Tarynn Garling CKoleen Distance, LDN Please refer to [facility] and/or RD on-call/weekend/after hours pager

## 2021-07-05 NOTE — Progress Notes (Signed)
NAME:  Heather Lopez, MRN:  [id number], DOB:  February 16, 1946, LOS: 8 ADMISSION DATE:  06/27/2021, CONSULTATION DATE:  06/30/21 REFERRING MD:  Hampton Abbot, MD  CHIEF COMPLAINT:  Abdominal Pain   HPI  75 y.o with significant PMH as below who presented to the ED on 06/27/21 with chief complaints of progressive shortness of breath, productive cough, loss of appetite, nausea, dry heaving fatigue, malaise and generalized body aches. Patient initially went to her PCP on 7/29 with complaints of rhinorrhea, harsh cough, loss of appetite, nausea, fatigue, malaise and generalized body aches x2 days, patient was diagnosed with pneumonia of which she received ceftriaxone 1 mg IM and sent home with Levaquin, prednisone and Tessalon.  Per ED reports, patient reported progressive worsening of her symptoms during ED visit.   Past Medical History  Diabetes mellitus type 2 Anxiety and depression Hypertension Hyperlipidemia Hydronephrosis and recurrent UTI Gout and osteoarthritis Significant Hospital Events   8/2: Admitted to MedSurg unit with sepsis 8/3: Vascular consulted for HD catheter 8/4: Patient developed acute abdomen.  CT abdomen shows viscus perforation.  Patient taken to the OR urgently for exploratory lap.  Patient return to ICU on vent.  PCCM consulted 07/01/21- patient had SBT today for recruitment and chest physiotherapy.  Still has significant tachycardia and hypertension when awake (possible pain related) but mentation is good able to follow verbal communication.  07/02/21-patient has failed SBT today due to hypoxemia.  Reviewed care plan with surgery team.  07/03/21- Lung V/Q scan obtained ~ negative for PE.  Weaning FiO2, currently at 50% 8/9 severe resp failure, remains on vent  Consults:  Nephrology Vascular PCCM General Surgery  Procedures:  8/5: Exploratory Lap   Significant Diagnostic Tests:  8/2: Chest Xray>left basilar consolidation or atelectasis and suspected small left pleural effusion 8/2:  Renal ultrasound> moderate right hydronephrosis, mild left hydronephrosis new with bilateral nephrolithiasis 8/4: CTA abdomen and pelvis> Extensive pneumoperitoneum compatible with perforated viscus. Gastric perforation is suspected, though bowel wall defect is not definitively identified. Surgical consultation is recommended. 2. Large ill-defined mass arising from the upper pole left kidney consistent with renal cell carcinoma. Dedicated renal MRI may be useful when clinical situation permits. 3. Acute left-sided hydronephrosis due to a 12 mm left UPJ calculus. 4. Chronic right hydronephrosis and hydroureter without clear etiology. 5. Bilateral nonobstructing renal calculi as above. 6. Small volume ascites. 7. Trace bilateral pleural effusions with patchy bibasilar atelectasis. 8/7: Venous US BLE>> negative for DVT 8/8: Lung V/Q Scan>>Negative for pulmonary embolus.  Micro Data:  8/2: SARS-CoV-2 PCR> negative 8/2: Influenza PCR> negative 8/2: Blood culture x2> no growth 8/2: Urine Culture> 30,000 colonies yeast 8/2: Legionella urinary antigen> negative  Antimicrobials:  8/4 Fluconazole> 8/4 Zosyn>   HPI Remains on vent CT abd shows perinephric stranding Remains critically ill S/p doudenal bowel perf   OBJECTIVE  Blood pressure (!) 142/51, pulse 95, temperature 100.04 F (37.8 C), resp. rate 12, height 5' 2.01" (1.575 m), weight 102.9 kg, SpO2 96 %.    Vent Mode: PRVC FiO2 (%):  [30 %] 30 % Set Rate:  [14 bmp] 14 bmp Vt Set:  [490 mL] 490 mL PEEP:  [5 cmH20] 5 cmH20 Plateau Pressure:  [16 cmH20] 16 cmH20   Intake/Output Summary (Last 24 hours) at 07/05/2021 0755 Last data filed at 07/05/2021 0618 Gross per 24 hour  Intake 3538.5 ml  Output 1690 ml  Net 1848.5 ml    Filed Weights   06/27/21 0028 06/28/21 0100 06/30/21 0345  Weight:  99.8 kg 106.8 kg 102.9 kg   REVIEW OF SYSTEMS  PATIENT IS UNABLE TO PROVIDE COMPLETE REVIEW OF SYSTEMS DUE TO SEVERE CRITICAL  ILLNESS AND TOXIC METABOLIC ENCEPHALOPATHY   PHYSICAL EXAMINATION:  GENERAL:critically ill appearing, +resp distress MOUTH: Moist mucosal membrane. NECK: Supple. intubated PULMONARY: +rhonchi, +wheezing CARDIOVASCULAR: S1 and S2. Regular rate and rhythm. No murmurs, rubs, or gallops.  GASTROINTESTINAL: Distended, Midline incision with honey comb dressing dry and intact. JP Drains x 2 MUSCULOSKELETAL: No swelling, clubbing, or edema.  NEUROLOGIC: obtunded SKIN:intact,warm,dry   Labs/imaging that I havepersonally reviewed  (right click and "Reselect all SmartList Selections" daily)     Labs   CBC: Recent Labs  Lab 06/30/21 0320 07/01/21 0508 07/02/21 0508 07/02/21 2245 07/03/21 0509 07/04/21 0440 07/05/21 0443  WBC 30.3* 30.3* 23.2*  --  28.9* 24.4* 20.6*  NEUTROABS 27.9* 28.1*  --   --  26.6*  --  17.3*  HGB 10.0* 7.8* 7.5* 7.5* 7.4* 7.3* 6.8*  HCT 28.8* 23.0* 22.1* 23.2* 22.3* 22.6* 21.8*  MCV 81.1 81.0 83.1  --  84.8 87.3 87.6  PLT 288 231 190  --  161 145* 161     Basic Metabolic Panel: Recent Labs  Lab 07/01/21 0508 07/02/21 0508 07/03/21 0509 07/04/21 0440 07/05/21 0443  NA 135 134* 141 145 146*  K 4.5 4.1 3.8 4.0 3.9  CL 101 102 107 110 113*  CO2 '22 28 25 25 25  ' GLUCOSE 320* 360* 234* 209* 235*  BUN 110* 91* 98* 93* 104*  CREATININE 5.40* 4.01* 3.85* 3.67* 3.79*  CALCIUM 8.6* 8.3* 8.4* 8.6* 8.7*  MG 2.3 2.2 2.0 1.9 1.9  PHOS 8.8* 5.1* 2.9 3.0 2.9    GFR: Estimated Creatinine Clearance: 14.6 mL/min (A) (by C-G formula based on SCr of 3.79 mg/dL (H)). Recent Labs  Lab 06/29/21 0601 06/30/21 0320 07/01/21 0508 07/02/21 0508 07/03/21 0509 07/04/21 0440 07/05/21 0443  PROCALCITON 3.18  --   --   --   --   --   --   WBC 20.8* 30.3*   < > 23.2* 28.9* 24.4* 20.6*  LATICACIDVEN  --  1.3  --   --   --   --   --    < > = values in this interval not displayed.     Liver Function Tests: Recent Labs  Lab 07/01/21 0508 07/03/21 0509  AST 43* 38   ALT 44 34  ALKPHOS 61 77  BILITOT 1.1 0.4  PROT 5.7* 5.7*  ALBUMIN 1.9* 1.6*    No results for input(s): LIPASE, AMYLASE in the last 168 hours. No results for input(s): AMMONIA in the last 168 hours.  ABG    Component Value Date/Time   PHART 7.32 (L) 06/30/2021 2200   PCO2ART 37 06/30/2021 2200   PO2ART 104 06/30/2021 2200   HCO3 19.1 (L) 06/30/2021 2200   ACIDBASEDEF 6.4 (H) 06/30/2021 2200   O2SAT 97.5 06/30/2021 2200     Past Medical History  She,  has a past medical history of Anxiety, Cataract, Depression, Diabetes mellitus without complication ([place]), Gout, History of kidney stones, Hyperlipidemia, Hypertension, and Vertigo.   Surgical History    Past Surgical History:  Procedure Laterality Date   CATARACT EXTRACTION, BILATERAL Bilateral 2019   COLONOSCOPY WITH PROPOFOL N/A 08/19/2015   Procedure: COLONOSCOPY WITH PROPOFOL;  Surgeon: Lucilla Lame, MD;  Location: [place];  Service: Endoscopy;  Laterality: N/A;  diabetic - insulin   CYSTOSCOPY W/ RETROGRADES Bilateral 12/22/2019   Procedure:  CYSTOSCOPY WITH RETROGRADE PYELOGRAM;  Surgeon: Abbie Sons, MD;  Location: [facility];  Service: Urology;  Laterality: Bilateral;   CYSTOSCOPY WITH BIOPSY N/A 12/22/2019   Procedure: CYSTOSCOPY WITH bladder BIOPSY;  Surgeon: Abbie Sons, MD;  Location: [facility];  Service: Urology;  Laterality: N/A;   CYSTOSCOPY WITH STENT PLACEMENT Right 12/22/2019   Procedure: CYSTOSCOPY WITH STENT PLACEMENT;  Surgeon: Abbie Sons, MD;  Location: [facility];  Service: Urology;  Laterality: Right;   CYSTOSCOPY WITH URETEROSCOPY Bilateral 12/22/2019   Procedure: CYSTOSCOPY WITH URETEROSCOPY;  Surgeon: Abbie Sons, MD;  Location: [facility];  Service: Urology;  Laterality: Bilateral;   CYSTOSCOPY/URETEROSCOPY/HOLMIUM LASER/STENT PLACEMENT Right 12/22/2019   Procedure: CYSTOSCOPY/URETEROSCOPY/HOLMIUM LASER/STENT PLACEMENT;  Surgeon: Abbie Sons, MD;  Location: [facility];  Service:  Urology;  Laterality: Right;   DIALYSIS/PERMA CATHETER INSERTION N/A 06/29/2021   Procedure: DIALYSIS/PERMA CATHETER INSERTION;  Surgeon: Algernon Huxley, MD;  Location: [place] [facility];  Service: Cardiovascular;  Laterality: N/A;   EYE SURGERY     LAPAROTOMY N/A 06/29/2021   Procedure: EXPLORATORY LAPAROTOMY WITH REPAIR OF DUODENAL PERFORATION;  Surgeon: [name], MD;  Location: [facility];  Service: General;  Laterality: N/A;   POLYPECTOMY  08/19/2015   Procedure: POLYPECTOMY;  Surgeon: Lucilla Lame, MD;  Location: [place];  Service: Endoscopy;;   TUBAL LIGATION  1992    Allergies Allergies  Allergen Reactions   Contrast Media  [Iodinated Diagnostic Agents] Anaphylaxis   Iodine Swelling    (IV only) - angioedema       Active Hospital Problem list   Acute hypoxic respiratory failure Severe sepsis with septic shock Bowel perforation AKI on CKD stage IV Right UPJ stone with hydronephrosis Anion gap metabolic acidosis Lactic acidosis Diabetes mellitus  Assessment & Plan:    75 yo white female with acute severe sepsis and shock due to perforated Duodenal Ulcer complicated by acute resp failure with CAP effusions and atelectasis with stage 4 CKD   Severe ACUTE Hypoxic and Hypercapnic Respiratory Failure -continue Mechanical Ventilator support -continue Bronchodilator Therapy -Wean Fio2 and PEEP as tolerated -VAP/VENT bundle implementation -will perform SAT/SBT when respiratory parameters are met Continue Zosyn (previously on Ceftriaxone and Azithromycin)  INFECTIOUS DISEASE -continue antibiotics as prescribed Severe Sepsis secondary to UTI, Community Acquired Pneumonia  -S/p exploratory laparotomy and primary repair of duodenal ulcer with omental patch on 06/29/21   SEPTIC shock-resolved SOURCE-per abd -use vasopressors to keep MAP>65 as needed -follow ABG and LA as needed -follow up cultures -emperic ABX -consider stress dose steroids -aggressive IV  fluid Resuscitation   Renal Failure CKD 4 -continue Foley Catheter-assess need -Avoid nephrotoxic agents -Follow urine output, BMP -Ensure adequate renal perfusion, optimize oxygenation -Renal dose medications Suspected renal cell carcinoma, will need further eval/biopsy    Intake/Output Summary (Last 24 hours) at 07/05/2021 0758 Last data filed at 07/05/2021 0254 Gross per 24 hour  Intake 3538.5 ml  Output 1690 ml  Net 1848.5 ml    ENDO - ICU hypoglycemic\Hyperglycemia protocol -check FSBS per protocol   GI GI PROPHYLAXIS as indicated  NUTRITIONAL STATUS DIET-->TF's as tolerated, TPN Constipation protocol as indicated   ELECTROLYTES -follow labs as needed -replace as needed -pharmacy consultation and following      Best practice:  Diet:  NPO, TPN Pain/Anxiety/Delirium protocol (if indicated): Yes (RASS goal 0) VAP protocol (if indicated): Yes DVT prophylaxis: Heparin gtt GI prophylaxis: PPI Glucose control:  SSI Yes Central venous access:  Yes, and it is still  needed Arterial line:  Yes, and it is still needed Foley:  Yes, and it is still needed Mobility:  bed rest  PT consulted: N/A Last date of multidisciplinary goals of care discussion [8/8] Code Status:  full code Disposition: ICU      DVT/GI PRX  assessed I Assessed the need for Labs I Assessed the need for Foley I Assessed the need for Central Venous Line Family Discussion when available I Assessed the need for Mobilization I made an Assessment of medications to be adjusted accordingly Safety Risk assessment completed  CASE DISCUSSED IN MULTIDISCIPLINARY ROUNDS WITH ICU TEAM     Critical Care Time devoted to patient care services described in this note is 55 minutes.  Critical care was necessary to treat /prevent imminent and life-threatening deterioration. Overall, patient is critically ill, prognosis is guarded.  Patient with Multiorgan failure and at high risk for cardiac arrest  and death.    Corrin Parker, M.D.  [facility]  Medical Director [place] Director [facility] Cardio-Pulmonary Department        .

## 2021-07-06 DIAGNOSIS — N132 Hydronephrosis with renal and ureteral calculous obstruction: Secondary | ICD-10-CM

## 2021-07-06 DIAGNOSIS — A419 Sepsis, unspecified organism: Secondary | ICD-10-CM | POA: Diagnosis not present

## 2021-07-06 DIAGNOSIS — N134 Hydroureter: Secondary | ICD-10-CM | POA: Diagnosis not present

## 2021-07-06 DIAGNOSIS — N151 Renal and perinephric abscess: Secondary | ICD-10-CM

## 2021-07-06 DIAGNOSIS — N170 Acute kidney failure with tubular necrosis: Secondary | ICD-10-CM | POA: Diagnosis not present

## 2021-07-06 DIAGNOSIS — J9601 Acute respiratory failure with hypoxia: Secondary | ICD-10-CM | POA: Diagnosis not present

## 2021-07-06 DIAGNOSIS — J188 Other pneumonia, unspecified organism: Secondary | ICD-10-CM

## 2021-07-06 LAB — TYPE AND SCREEN
ABO/RH(D): O POS
Antibody Screen: NEGATIVE
Unit division: 0

## 2021-07-06 LAB — CBC WITH DIFFERENTIAL/PLATELET
Abs Immature Granulocytes: 0.51 10*3/uL — ABNORMAL HIGH (ref 0.00–0.07)
Basophils Absolute: 0 10*3/uL (ref 0.0–0.1)
Basophils Relative: 0 %
Eosinophils Absolute: 0 10*3/uL (ref 0.0–0.5)
Eosinophils Relative: 0 %
HCT: 24.3 % — ABNORMAL LOW (ref 36.0–46.0)
Hemoglobin: 7.5 g/dL — ABNORMAL LOW (ref 12.0–15.0)
Immature Granulocytes: 3 %
Lymphocytes Relative: 2 %
Lymphs Abs: 0.4 10*3/uL — ABNORMAL LOW (ref 0.7–4.0)
MCH: 28.1 pg (ref 26.0–34.0)
MCHC: 30.9 g/dL (ref 30.0–36.0)
MCV: 91 fL (ref 80.0–100.0)
Monocytes Absolute: 0.5 10*3/uL (ref 0.1–1.0)
Monocytes Relative: 3 %
Neutro Abs: 13.5 10*3/uL — ABNORMAL HIGH (ref 1.7–7.7)
Neutrophils Relative %: 92 %
Platelets: 196 10*3/uL (ref 150–400)
RBC: 2.67 MIL/uL — ABNORMAL LOW (ref 3.87–5.11)
RDW: 15.9 % — ABNORMAL HIGH (ref 11.5–15.5)
WBC: 14.8 10*3/uL — ABNORMAL HIGH (ref 4.0–10.5)
nRBC: 0.1 % (ref 0.0–0.2)

## 2021-07-06 LAB — BPAM RBC
Blood Product Expiration Date: 202209112359
ISSUE DATE / TIME: 202208101611
Unit Type and Rh: 5100

## 2021-07-06 LAB — GLUCOSE, CAPILLARY
Glucose-Capillary: 262 mg/dL — ABNORMAL HIGH (ref 70–99)
Glucose-Capillary: 322 mg/dL — ABNORMAL HIGH (ref 70–99)
Glucose-Capillary: 332 mg/dL — ABNORMAL HIGH (ref 70–99)
Glucose-Capillary: 366 mg/dL — ABNORMAL HIGH (ref 70–99)
Glucose-Capillary: 372 mg/dL — ABNORMAL HIGH (ref 70–99)
Glucose-Capillary: 384 mg/dL — ABNORMAL HIGH (ref 70–99)
Glucose-Capillary: 396 mg/dL — ABNORMAL HIGH (ref 70–99)
Glucose-Capillary: 399 mg/dL — ABNORMAL HIGH (ref 70–99)
Glucose-Capillary: 411 mg/dL — ABNORMAL HIGH (ref 70–99)
Glucose-Capillary: 430 mg/dL — ABNORMAL HIGH (ref 70–99)
Glucose-Capillary: 442 mg/dL — ABNORMAL HIGH (ref 70–99)
Glucose-Capillary: 444 mg/dL — ABNORMAL HIGH (ref 70–99)

## 2021-07-06 LAB — BLOOD GAS, ARTERIAL
Acid-base deficit: 6.7 mmol/L — ABNORMAL HIGH (ref 0.0–2.0)
Bicarbonate: 20.2 mmol/L (ref 20.0–28.0)
FIO2: 0.45
O2 Saturation: 83.9 %
PEEP: 5 cmH2O
Patient temperature: 37
Pressure support: 5 cmH2O
pCO2 arterial: 46 mmHg (ref 32.0–48.0)
pH, Arterial: 7.25 — ABNORMAL LOW (ref 7.350–7.450)
pO2, Arterial: 57 mmHg — ABNORMAL LOW (ref 83.0–108.0)

## 2021-07-06 LAB — MAGNESIUM: Magnesium: 1.9 mg/dL (ref 1.7–2.4)

## 2021-07-06 LAB — PHOSPHORUS: Phosphorus: 2.8 mg/dL (ref 2.5–4.6)

## 2021-07-06 LAB — COMPREHENSIVE METABOLIC PANEL
ALT: 46 U/L — ABNORMAL HIGH (ref 0–44)
AST: 42 U/L — ABNORMAL HIGH (ref 15–41)
Albumin: 1.5 g/dL — ABNORMAL LOW (ref 3.5–5.0)
Alkaline Phosphatase: 83 U/L (ref 38–126)
Anion gap: 9 (ref 5–15)
BUN: 111 mg/dL — ABNORMAL HIGH (ref 8–23)
CO2: 21 mmol/L — ABNORMAL LOW (ref 22–32)
Calcium: 8.5 mg/dL — ABNORMAL LOW (ref 8.9–10.3)
Chloride: 114 mmol/L — ABNORMAL HIGH (ref 98–111)
Creatinine, Ser: 3.62 mg/dL — ABNORMAL HIGH (ref 0.44–1.00)
GFR, Estimated: 13 mL/min — ABNORMAL LOW (ref >60)
Glucose, Bld: 428 mg/dL — ABNORMAL HIGH (ref 70–99)
Potassium: 4.7 mmol/L (ref 3.5–5.1)
Sodium: 144 mmol/L (ref 135–145)
Total Bilirubin: 0.3 mg/dL (ref 0.3–1.2)
Total Protein: 5.9 g/dL — ABNORMAL LOW (ref 6.5–8.1)

## 2021-07-06 MED ORDER — DEXTROSE 50 % IV SOLN: 0.0000 mL | INTRAVENOUS | Status: DC | PRN

## 2021-07-06 MED ORDER — INSULIN REGULAR(HUMAN) IN NACL 100-0.9 UT/100ML-% IV SOLN: INTRAVENOUS | Status: DC

## 2021-07-06 MED ORDER — M.V.I. ADULT IV INJ
INJECTION | INTRAVENOUS | Status: DC
Start: 2021-07-06 — End: 2021-07-07
  Filled 2021-07-06: qty 312

## 2021-07-06 MED ORDER — INSULIN REGULAR(HUMAN) IN NACL 100-0.9 UT/100ML-% IV SOLN
INTRAVENOUS | Status: DC
  Administered 2021-07-06: 11.5 [IU]/h via INTRAVENOUS
  Administered 2021-07-06: 22 [IU]/h via INTRAVENOUS
  Administered 2021-07-07: 13 [IU]/h via INTRAVENOUS
  Administered 2021-07-07: 8 [IU]/h via INTRAVENOUS
  Filled 2021-07-06 (×4): qty 100

## 2021-07-06 MED ORDER — INSULIN DETEMIR 100 UNIT/ML ~~LOC~~ SOLN
25.0000 [IU] | Freq: Two times a day (BID) | SUBCUTANEOUS | Status: DC
  Filled 2021-07-06 (×2): qty 0.25

## 2021-07-06 MED ORDER — INSULIN ASPART 100 UNIT/ML IJ SOLN
6.0000 [IU] | INTRAMUSCULAR | Status: DC
  Administered 2021-07-06: 6 [IU] via SUBCUTANEOUS
  Filled 2021-07-06: qty 1

## 2021-07-06 NOTE — Progress Notes (Signed)
NAME:  Heather Lopez, MRN:  [id number], DOB:  February 12, 1946, LOS: 9 ADMISSION DATE:  06/27/2021, CONSULTATION DATE:  06/30/21 REFERRING MD:  Hampton Abbot, MD  CHIEF COMPLAINT:  Abdominal Pain   HPI  75 y.o with significant PMH as below who presented to the ED on 06/27/21 with chief complaints of progressive shortness of breath, productive cough, loss of appetite, nausea, dry heaving fatigue, malaise and generalized body aches. Patient initially went to her PCP on 7/29 with complaints of rhinorrhea, harsh cough, loss of appetite, nausea, fatigue, malaise and generalized body aches x2 days, patient was diagnosed with pneumonia of which she received ceftriaxone 1 mg IM and sent home with Levaquin, prednisone and Tessalon.  Per ED reports, patient reported progressive worsening of her symptoms during ED visit.   Past Medical History  Diabetes mellitus type 2 Anxiety and depression Hypertension Hyperlipidemia Hydronephrosis and recurrent UTI Gout and osteoarthritis Significant Hospital Events   8/2: Admitted to MedSurg unit with sepsis 8/3: Vascular consulted for HD catheter 8/4: Patient developed acute abdomen.  CT abdomen shows viscus perforation.  Patient taken to the OR urgently for exploratory lap.  Patient return to ICU on vent.  PCCM consulted 07/01/21- patient had SBT today for recruitment and chest physiotherapy.  Still has significant tachycardia and hypertension when awake (possible pain related) but mentation is good able to follow verbal communication.  07/02/21-patient has failed SBT today due to hypoxemia.  Reviewed care plan with surgery team.  07/03/21- Lung V/Q scan obtained ~ negative for PE.  Weaning FiO2, currently at 50% 8/9 severe resp failure, remains on vent 8/10 s/p Cystoscopy with Bilateral ureteral stent placement (40F/24 cm) for LEFT OBSTRUCTED KIDNEY STONE 8/11 remains on VENT  Consults:  Nephrology Vascular PCCM General Surgery, UROLOGY  Procedures:  8/5: Exploratory Lap    Significant Diagnostic Tests:  8/2: Chest Xray>left basilar consolidation or atelectasis and suspected small left pleural effusion 8/2: Renal ultrasound> moderate right hydronephrosis, mild left hydronephrosis new with bilateral nephrolithiasis 8/4: CTA abdomen and pelvis> Extensive pneumoperitoneum compatible with perforated viscus. Gastric perforation is suspected, though bowel wall defect is not definitively identified. Surgical consultation is recommended. 2. Large ill-defined mass arising from the upper pole left kidney consistent with renal cell carcinoma. Dedicated renal MRI may be useful when clinical situation permits. 3. Acute left-sided hydronephrosis due to a 12 mm left UPJ calculus. 4. Chronic right hydronephrosis and hydroureter without clear etiology. 5. Bilateral nonobstructing renal calculi as above. 6. Small volume ascites. 7. Trace bilateral pleural effusions with patchy bibasilar atelectasis. 8/7: Venous US BLE>> negative for DVT 8/8: Lung V/Q Scan>>Negative for pulmonary embolus.  Micro Data:  8/2: SARS-CoV-2 PCR> negative 8/2: Influenza PCR> negative 8/2: Blood culture x2> no growth 8/2: Urine Culture> 30,000 colonies yeast 8/2: Legionella urinary antigen> negative  Antimicrobials:  8/4 Fluconazole> 8/4 Zosyn>  INTERVAL CHANGES S/p urteral stents placed Remains critically ill On vent S/p doudenal bowel perf   OBJECTIVE  Blood pressure (!) 148/50, pulse 100, temperature 97.9 F (36.6 C), resp. rate 14, height 5' 2.01" (1.575 m), weight 102.6 kg, SpO2 97 %.    Vent Mode: PRVC FiO2 (%):  [30 %-50 %] 35 % Set Rate:  [14 bmp] 14 bmp Vt Set:  [490 mL] 490 mL PEEP:  [5 cmH20] 5 cmH20 Pressure Support:  [5 cmH20] 5 cmH20   Intake/Output Summary (Last 24 hours) at 07/06/2021 0728 Last data filed at 07/06/2021 0547 Gross per 24 hour  Intake 2526.06 ml  Output 3180 ml  Net -653.94 ml    Filed Weights   06/28/21 0100 06/30/21 0345 07/06/21 0453   Weight: 106.8 kg 102.9 kg 102.6 kg    REVIEW OF SYSTEMS  PATIENT IS UNABLE TO PROVIDE COMPLETE REVIEW OF SYSTEMS DUE TO SEVERE CRITICAL ILLNESS AND TOXIC METABOLIC ENCEPHALOPATHY   PHYSICAL EXAMINATION:  GENERAL:critically ill appearing, +resp distress HEAD: Normocephalic, atraumatic.  EYES: Pupils equal, round, reactive to light.  No scleral icterus.  MOUTH: Moist mucosal membrane.intubated NECK: Supple.  PULMONARY: +rhonchi,  CARDIOVASCULAR: S1 and S2. Regular, No murmurs  GASTROINTESTINAL: Distended, Midline incision with honey comb dressing dry and intact. JP Drains x 2 MUSCULOSKELETAL: +edema.  NEUROLOGIC: obtunded SKIN:intact,warm,dry   Labs/imaging that I havepersonally reviewed  (right click and "Reselect all SmartList Selections" daily)     Labs   CBC: Recent Labs  Lab 06/30/21 0320 07/01/21 0508 07/02/21 0508 07/02/21 2245 07/03/21 0509 07/04/21 0440 07/05/21 0443 07/05/21 1244 07/05/21 2015 07/06/21 0440  WBC 30.3* 30.3* 23.2*  --  28.9* 24.4* 20.6*  --   --  14.8*  NEUTROABS 27.9* 28.1*  --   --  26.6*  --  17.3*  --   --  13.5*  HGB 10.0* 7.8* 7.5*   < > 7.4* 7.3* 6.8* 6.6* 8.0* 7.5*  HCT 28.8* 23.0* 22.1*   < > 22.3* 22.6* 21.8* 21.7* 25.6* 24.3*  MCV 81.1 81.0 83.1  --  84.8 87.3 87.6  --   --  91.0  PLT 288 231 190  --  161 145* 161  --   --  196   < > = values in this interval not displayed.     Basic Metabolic Panel: Recent Labs  Lab 07/02/21 0508 07/03/21 0509 07/04/21 0440 07/05/21 0443 07/06/21 0440  NA 134* 141 145 146* 144  K 4.1 3.8 4.0 3.9 4.7  CL 102 107 110 113* 114*  CO2 '28 25 25 25 ' 21*  GLUCOSE 360* 234* 209* 235* 428*  BUN 91* 98* 93* 104* 111*  CREATININE 4.01* 3.85* 3.67* 3.79* 3.62*  CALCIUM 8.3* 8.4* 8.6* 8.7* 8.5*  MG 2.2 2.0 1.9 1.9 1.9  PHOS 5.1* 2.9 3.0 2.9 2.8    GFR: Estimated Creatinine Clearance: 15.3 mL/min (A) (by C-G formula based on SCr of 3.62 mg/dL (H)). Recent Labs  Lab 06/30/21 0320  07/01/21 0508 07/03/21 0509 07/04/21 0440 07/05/21 0443 07/06/21 0440  WBC 30.3*   < > 28.9* 24.4* 20.6* 14.8*  LATICACIDVEN 1.3  --   --   --   --   --    < > = values in this interval not displayed.     Liver Function Tests: Recent Labs  Lab 07/01/21 0508 07/03/21 0509 07/06/21 0440  AST 43* 38 42*  ALT 44 34 46*  ALKPHOS 61 77 83  BILITOT 1.1 0.4 0.3  PROT 5.7* 5.7* 5.9*  ALBUMIN 1.9* 1.6* 1.5*    No results for input(s): LIPASE, AMYLASE in the last 168 hours. No results for input(s): AMMONIA in the last 168 hours.  ABG    Component Value Date/Time   PHART 7.32 (L) 06/30/2021 2200   PCO2ART 37 06/30/2021 2200   PO2ART 104 06/30/2021 2200   HCO3 19.1 (L) 06/30/2021 2200   ACIDBASEDEF 6.4 (H) 06/30/2021 2200   O2SAT 97.5 06/30/2021 2200     Past Medical History  She,  has a past medical history of Anxiety, Cataract, Depression, Diabetes mellitus without complication ([place]), Gout, History of kidney stones, Hyperlipidemia, Hypertension, and Vertigo.  Surgical History    Past Surgical History:  Procedure Laterality Date   CATARACT EXTRACTION, BILATERAL Bilateral 2019   COLONOSCOPY WITH PROPOFOL N/A 08/19/2015   Procedure: COLONOSCOPY WITH PROPOFOL;  Surgeon: Lucilla Lame, MD;  Location: [facility];  Service: Endoscopy;  Laterality: N/A;  diabetic - insulin   CYSTOSCOPY W/ RETROGRADES Bilateral 12/22/2019   Procedure: CYSTOSCOPY WITH RETROGRADE PYELOGRAM;  Surgeon: Abbie Sons, MD;  Location: [facility];  Service: Urology;  Laterality: Bilateral;   CYSTOSCOPY W/ URETERAL STENT PLACEMENT Bilateral 07/05/2021   Procedure: CYSTOSCOPY WITH RETROGRADE PYELOGRAM/BILATERAL URETERAL STENT PLACEMENT;  Surgeon: Abbie Sons, MD;  Location: [facility];  Service: Urology;  Laterality: Bilateral;   CYSTOSCOPY WITH BIOPSY N/A 12/22/2019   Procedure: CYSTOSCOPY WITH bladder BIOPSY;  Surgeon: Abbie Sons, MD;  Location: [facility];  Service: Urology;  Laterality: N/A;    CYSTOSCOPY WITH STENT PLACEMENT Right 12/22/2019   Procedure: CYSTOSCOPY WITH STENT PLACEMENT;  Surgeon: Abbie Sons, MD;  Location: [facility];  Service: Urology;  Laterality: Right;   CYSTOSCOPY WITH URETEROSCOPY Bilateral 12/22/2019   Procedure: CYSTOSCOPY WITH URETEROSCOPY;  Surgeon: Abbie Sons, MD;  Location: [facility];  Service: Urology;  Laterality: Bilateral;   CYSTOSCOPY/URETEROSCOPY/HOLMIUM LASER/STENT PLACEMENT Right 12/22/2019   Procedure: CYSTOSCOPY/URETEROSCOPY/HOLMIUM LASER/STENT PLACEMENT;  Surgeon: Abbie Sons, MD;  Location: [facility];  Service: Urology;  Laterality: Right;   DIALYSIS/PERMA CATHETER INSERTION N/A 06/29/2021   Procedure: DIALYSIS/PERMA CATHETER INSERTION;  Surgeon: Algernon Huxley, MD;  Location: [facility];  Service: Cardiovascular;  Laterality: N/A;   EYE SURGERY     LAPAROTOMY N/A 06/29/2021   Procedure: EXPLORATORY LAPAROTOMY WITH REPAIR OF DUODENAL PERFORATION;  Surgeon: [name], MD;  Location: [facility];  Service: General;  Laterality: N/A;   POLYPECTOMY  08/19/2015   Procedure: POLYPECTOMY;  Surgeon: Lucilla Lame, MD;  Location: [facility];  Service: Endoscopy;;   TUBAL LIGATION  1992    Allergies Allergies  Allergen Reactions   Contrast Media  [Iodinated Diagnostic Agents] Anaphylaxis   Iodine Swelling    (IV only) - angioedema       Active Hospital Problem list   Acute hypoxic respiratory failure Severe sepsis with septic shock Bowel perforation AKI on CKD stage IV Right UPJ stone with hydronephrosis Anion gap metabolic acidosis Lactic acidosis Diabetes mellitus  Assessment & Plan:    75 yo white female with acute severe sepsis and shock due to perforated Duodenal Ulcer complicated by acute resp failure with CAP effusions and atelectasis with stage 4 CKD with left obstructing kidney stone s/p stents  Severe ACUTE Hypoxic and Hypercapnic Respiratory Failure -continue Mechanical Ventilator support -continue  Bronchodilator Therapy -Wean Fio2 and PEEP as tolerated -VAP/VENT bundle implementation -will perform SAT/SBT when respiratory parameters are met  INFECTIOUS DISEASE -continue antibiotics as prescribed -follow up cultures Severe Sepsis secondary to UTI, Community Acquired Pneumonia  -S/p exploratory laparotomy and primary repair of duodenal ulcer with omental patch on 06/29/21   SEPTIC shock-resolved SOURCE-per abd -use vasopressors to keep MAP>65 as needed  Renal Failure CKD 4 -continue Foley Catheter-assess need -Avoid nephrotoxic agents -Follow urine output, BMP -Ensure adequate renal perfusion, optimize oxygenation -Renal dose medications Hydronephrosis with obstructed kidney stone   Intake/Output Summary (Last 24 hours) at 07/06/2021 0732 Last data filed at 07/06/2021 0547 Gross per 24 hour  Intake 2526.06 ml  Output 3180 ml  Net -653.94 ml    ENDO - ICU hypoglycemic\Hyperglycemia protocol -check FSBS per protocol  GI GI  PROPHYLAXIS as indicated  NUTRITIONAL STATUS DIET-->TF's as tolerated Constipation protocol as indicated  ELECTROLYTES -follow labs as needed -replace as needed -pharmacy consultation and following    CBC    Component Value Date/Time   WBC 14.8 (H) 07/06/2021 0440   RBC 2.67 (L) 07/06/2021 0440   HGB 7.5 (L) 07/06/2021 0440   HGB 13.9 01/29/2014 0406   HCT 24.3 (L) 07/06/2021 0440   HCT 42.4 01/28/2014 0513   PLT 196 07/06/2021 0440   PLT 217 01/29/2014 0406   MCV 91.0 07/06/2021 0440   MCV 89 01/28/2014 0513   MCH 28.1 07/06/2021 0440   MCHC 30.9 07/06/2021 0440   RDW 15.9 (H) 07/06/2021 0440   RDW 14.2 01/28/2014 0513   LYMPHSABS 0.4 (L) 07/06/2021 0440   LYMPHSABS 1.3 01/28/2014 0513   MONOABS 0.5 07/06/2021 0440   MONOABS 0.4 01/28/2014 0513   EOSABS 0.0 07/06/2021 0440   EOSABS 0.0 01/28/2014 0513   BASOSABS 0.0 07/06/2021 0440   BASOSABS 0.1 01/28/2014 0513   BMP Latest Ref Rng & Units 07/06/2021 07/05/2021 07/04/2021   Glucose 70 - 99 mg/dL 428(H) 235(H) 209(H)  BUN 8 - 23 mg/dL 111(H) 104(H) 93(H)  Creatinine 0.44 - 1.00 mg/dL 3.62(H) 3.79(H) 3.67(H)  BUN/Creat Ratio 6 - 22 (calc) - - -  Sodium 135 - 145 mmol/L 144 146(H) 145  Potassium 3.5 - 5.1 mmol/L 4.7 3.9 4.0  Chloride 98 - 111 mmol/L 114(H) 113(H) 110  CO2 22 - 32 mmol/L 21(L) 25 25  Calcium 8.9 - 10.3 mg/dL 8.5(L) 8.7(L) 8.6(L)      Best practice:  Diet:  NPO, TPN Pain/Anxiety/Delirium protocol (if indicated): Yes (RASS goal 0) VAP protocol (if indicated): Yes DVT prophylaxis: Heparin gtt GI prophylaxis: PPI Glucose control:  SSI Yes Central venous access:  Yes, and it is still needed Arterial line:  Yes, and it is still needed Foley:  Yes, and it is still needed Mobility:  bed rest  PT consulted: N/A Last date of multidisciplinary goals of care discussion [8/8] Code Status:  full code Disposition: ICU      DVT/GI PRX  assessed I Assessed the need for Labs I Assessed the need for Foley I Assessed the need for Central Venous Line Family Discussion when available I Assessed the need for Mobilization I made an Assessment of medications to be adjusted accordingly Safety Risk assessment completed  CASE DISCUSSED IN MULTIDISCIPLINARY ROUNDS WITH ICU TEAM     Critical Care Time devoted to patient care services described in this note is 50 minutes.  Critical care was necessary to treat /prevent imminent and life-threatening deterioration. Overall, patient is critically ill, prognosis is guarded.  Patient with Multiorgan failure and at high risk for cardiac arrest and death.    Corrin Parker, M.D.  [facility]  Medical Director [place] Director [facility] Cardio-Pulmonary Department        .

## 2021-07-06 NOTE — Anesthesia Postprocedure Evaluation (Signed)
Anesthesia Post Note  Patient: Heather Lopez  Procedure(s) Performed: CYSTOSCOPY WITH RETROGRADE PYELOGRAM/BILATERAL URETERAL STENT PLACEMENT (Bilateral)  Patient location during evaluation: SICU Anesthesia Type: General Level of consciousness: sedated Pain management: pain level controlled Vital Signs Assessment: post-procedure vital signs reviewed and stable Respiratory status: patient on ventilator - see flowsheet for VS Cardiovascular status: stable Postop Assessment: no apparent nausea or vomiting Anesthetic complications: no   No notable events documented.   Last Vitals:  Vitals:   07/06/21 0500 07/06/21 0600  BP: (!) 142/52 (!) 148/50  Pulse: 90 100  Resp: 14 14  Temp: 36.5 C 36.6 C  SpO2: 96% 97%    Last Pain:  Vitals:   07/05/21 1630  TempSrc: Esophageal  PainSc:                  Alison Stalling

## 2021-07-06 NOTE — Progress Notes (Signed)
Urology Inpatient Progress Note  Subjective: Afebrile, VSS this morning. She remains in the ICU, intubated and sedated. WBC count town today, 14.8. Creatiine down today, 3.62. Intraoperative urine cultures pending, on antibiotics as below. Foley catheter in place draining purulent urine. Patient unable to contribute to HPI today. Husband at the bedside.  Anti-infectives: Anti-infectives (From admission, onward)    Start     Dose/Rate Route Frequency Ordered Stop   07/05/21 2200  piperacillin-tazobactam (ZOSYN) IVPB 2.25 g        2.25 g 100 mL/hr over 30 Minutes Intravenous Every 6 hours 07/05/21 1557     07/01/21 1800  fluconazole (DIFLUCAN) IVPB 200 mg        200 mg 100 mL/hr over 60 Minutes Intravenous Every 24 hours 06/30/21 1328     06/30/21 1800  fluconazole (DIFLUCAN) IVPB 400 mg        400 mg 100 mL/hr over 120 Minutes Intravenous  Once 06/30/21 1328 06/30/21 2205   06/30/21 0500  ceFAZolin (ANCEF) IVPB 1 g/50 mL premix  Status:  Discontinued       Note to Pharmacy: To be given in specials   1 g 100 mL/hr over 30 Minutes Intravenous  Once 06/29/21 0703 06/29/21 1637   06/30/21 0430  fluconazole (DIFLUCAN) IVPB 400 mg  Status:  Discontinued        400 mg 100 mL/hr over 120 Minutes Intravenous Every 24 hours 06/30/21 0337 06/30/21 1326   06/29/21 2330  piperacillin-tazobactam (ZOSYN) IVPB 2.25 g  Status:  Discontinued        2.25 g 100 mL/hr over 30 Minutes Intravenous Every 8 hours 06/29/21 2240 07/05/21 1557   06/29/21 1236  ceFAZolin (ANCEF) 1-4 GM/50ML-% IVPB       Note to Pharmacy: Corlis Hove   : cabinet override      06/29/21 1236 06/30/21 0044   06/28/21 1600  fluconazole (DIFLUCAN) tablet 150 mg        150 mg Oral  Once 06/28/21 1507 06/28/21 1649   06/27/21 0145  cefTRIAXone (ROCEPHIN) 2 g in sodium chloride 0.9 % 100 mL IVPB  Status:  Discontinued        2 g 200 mL/hr over 30 Minutes Intravenous Every 24 hours 06/27/21 0137 06/29/21 2302   06/27/21 0145   azithromycin (ZITHROMAX) 500 mg in sodium chloride 0.9 % 250 mL IVPB  Status:  Discontinued        500 mg 250 mL/hr over 60 Minutes Intravenous Every 24 hours 06/27/21 0137 06/29/21 1138       Current Facility-Administered Medications  Medication Dose Route Frequency Provider Last Rate Last Admin   0.9 %  sodium chloride infusion  100 mL Intravenous PRN Algernon Huxley, MD       acetaminophen (TYLENOL) tablet 650 mg  650 mg Per Tube Q6H PRN Milus Banister, NP   650 mg at 07/05/21 0924   alteplase (CATHFLO ACTIVASE) injection 2 mg  2 mg Intracatheter Once PRN Algernon Huxley, MD       chlorhexidine gluconate (MEDLINE KIT) (PERIDEX) 0.12 % solution 15 mL  15 mL Mouth Rinse BID Ottie Glazier, MD   15 mL at 07/06/21 0800   Chlorhexidine Gluconate Cloth 2 % PADS 6 each  6 each Topical Q0600 Algernon Huxley, MD   6 each at 07/06/21 0550   dexmedetomidine (PRECEDEX) 400 MCG/100ML (4 mcg/mL) infusion  0.4-1.2 mcg/kg/hr Intravenous Titrated Ottie Glazier, MD   Stopped at 07/04/21 1050   esmolol (  BREVIBLOC) 2000 mg / 100 mL (20 mg/mL) infusion  25-300 mcg/kg/min Intravenous Titrated Darel Hong D, NP   Stopped at 07/04/21 1035   feeding supplement (PIVOT 1.5 CAL) liquid 1,000 mL  1,000 mL Per Tube Q24H Flora Lipps, MD       feeding supplement (PROSource TF) liquid 45 mL  45 mL Per Tube TID Flora Lipps, MD   45 mL at 07/06/21 0802   fentaNYL (SUBLIMAZE) bolus via infusion 25-100 mcg  25-100 mcg Intravenous Q1H PRN Lang Snow, NP   100 mcg at 07/05/21 1545   fentaNYL 2523mg in NS 257m(1024mml) infusion-PREMIX  25-200 mcg/hr Intravenous Continuous OumLang SnowP 17.5 mL/hr at 07/06/21 0709 175 mcg/hr at 07/06/21 0709   fluconazole (DIFLUCAN) IVPB 200 mg  200 mg Intravenous Q24H ChaBenita GutterPHVidant Medical CenterStopped at 07/05/21 2022   free water 30 mL  30 mL Per Tube Q4H KasFlora LippsD   30 mL at 07/06/21 0434   heparin injection 1,000 Units  1,000 Units Dialysis PRN DewAlgernon HuxleyMD       heparin injection 5,000 Units  5,000 Units Subcutaneous Q8H GonTyler PitaD   5,000 Units at 07/06/21 0551   insulin aspart (novoLOG) injection 0-20 Units  0-20 Units Subcutaneous Q4H Rust-Chester, Britton L, NP   20 Units at 07/06/21 0801   insulin aspart (novoLOG) injection 3 Units  3 Units Subcutaneous Q4H Graves, DanRaeford RazorP   3 Units at 07/06/21 0801   insulin detemir (LEVEMIR) injection 20 Units  20 Units Subcutaneous BID Rust-Chester, Britton L, NP   20 Units at 07/06/21 0802   labetalol (NORMODYNE) injection 10-20 mg  10-20 mg Intravenous Q10 min PRN KasFlora LippsD   20 mg at 07/05/21 0751   lidocaine (PF) (XYLOCAINE) 1 % injection 5 mL  5 mL Intradermal PRN DewAlgernon HuxleyD       lidocaine-prilocaine (EMLA) cream 1 application  1 application Topical PRN DewAlgernon HuxleyD       MEDLINE mouth rinse  15 mL Mouth Rinse 10 times per day AleOttie GlazierD   15 mL at 07/06/21 0804   midazolam (VERSED) injection 1-2 mg  1-2 mg Intravenous Q1H PRN KasFlora LippsD   2 mg at 07/06/21 0553   [START ON 07/08/2021] multivitamin liquid 15 mL  15 mL Per Tube Daily KasFlora LippsD       nutrition supplement (JUVEN) (JUVEN) powder packet 1 packet  1 packet Per Tube BID BM KasFlora LippsD   1 packet at 07/06/21 0803   ondansetron (ZOFRAN) injection 4 mg  4 mg Intravenous Q6H PRN DewAlgernon HuxleyD       pantoprazole (PROTONIX) injection 40 mg  40 mg Intravenous Q12H AleOttie GlazierD   40 mg at 07/06/21 0803   pentafluoroprop-tetrafluoroeth (GEBAUERS) aerosol 1 application  1 application Topical PRN DewAlgernon HuxleyD       piperacillin-tazobactam (ZOSYN) IVPB 2.25 g  2.25 g Intravenous Q6H ZeiBerton MountPH 100 mL/hr at 07/06/21 0555 2.25 g at 07/06/21 0555   TPN ADULT (ION)   Intravenous Continuous TPN KasFlora LippsD 65 mL/hr at 07/06/21 0547 Infusion Verify at 07/06/21 0547   Objective: Vital signs in last 24 hours: Temp:  [97.5 F (36.4 C)-101.1 F (38.4 C)] 97.9 F (36.6 C)  (08/11 0600) Pulse Rate:  [87-107] 100 (08/11 0600) Resp:  [10-16] 14 (08/11 0600) BP: (129-180)/(48-61) 148/50 (  08/11 0600) SpO2:  [93 %-100 %] 97 % (08/11 0600) Arterial Line BP: (127-182)/(44-111) 165/50 (08/11 0600) FiO2 (%):  [30 %-50 %] 35 % (08/11 0405) Weight:  [102.6 kg] 102.6 kg (08/11 0453)  Intake/Output from previous day: 08/10 0701 - 08/11 0700 In: 2526.1 [I.V.:1831.6; Blood:343.8; NG/GT:150; IV Piggyback:200.7] Out: 3180 [Urine:3100; Drains:80] Intake/Output this shift: No intake/output data recorded.  Physical Exam Vitals and nursing note reviewed.  Constitutional:      Appearance: She is ill-appearing. She is not toxic-appearing.     Interventions: She is sedated and intubated.  HENT:     Head: Normocephalic and atraumatic.  Pulmonary:     Effort: She is intubated.  Skin:    General: Skin is warm and dry.   Lab Results:  Recent Labs    07/05/21 0443 07/05/21 1244 07/05/21 2015 07/06/21 0440  WBC 20.6*  --   --  14.8*  HGB 6.8*   < > 8.0* 7.5*  HCT 21.8*   < > 25.6* 24.3*  PLT 161  --   --  196   < > = values in this interval not displayed.   BMET Recent Labs    07/05/21 0443 07/06/21 0440  NA 146* 144  K 3.9 4.7  CL 113* 114*  CO2 25 21*  GLUCOSE 235* 428*  BUN 104* 111*  CREATININE 3.79* 3.62*  CALCIUM 8.7* 8.5*   Assessment & Plan: 75 year old female with PMH nephrolithiasis and chronic right hydroureteronephrosis admitted with acute respiratory failure due to CAP, perforated duodenal ulcer s/p repair, acute on chronic renal failure, and a 14 mm obstructing left UPJ stone with a left upper pole renal lesion expanding in size concerning for likely abscess as well as sepsis with GI versus urinary source, now POD 1 from bilateral ureteral stent placement with Dr. Bernardo Heater.  Leukocytosis significantly improved today, creatinine downtrending, and she has been afebrile since stent placement.  I discussed her clinical improvement with her husband  at the bedside this morning.  No further intervention indicated at this time, however again may consider repeat renal imaging if she decompensates or improvement stalls over the next 48 to 72 hours.  Recommendations: -Continue empiric antibiotics and follow intraoperative urine cultures, though these may be negative given antibiotics prior to collection -Outpatient ureteroscopy with laser lithotripsy and stent exchange in 2 to 4 weeks -Consider repeat renal imaging as above -Outpatient renal imaging in 4 to 6 weeks to prove resolution of likely left renal abscess  Debroah Loop, PA-C 07/06/2021

## 2021-07-06 NOTE — Consult Note (Signed)
PHARMACY - TOTAL PARENTERAL NUTRITION CONSULT NOTE   Indication:  Prolonged NPO  Patient Measurements: Height: 5' 2.01" (157.5 cm) Weight: 102.6 kg (226 lb 3.1 oz) IBW/kg (Calculated) : 50.12 TPN AdjBW (KG): 62.5 Body mass index is 41.36 kg/m.  Assessment:  Patient is a [name] with medical history including HTN, diabetes, HLD, anxiety / depression, gout who was admitted 8/2 with acute renal failure, acute respiratory failure secondary to pneumonia. Hospital course complicated by perforated duodenal ulcer and patient was emergently taken to OR 8/5 for ExLap, repair of perforated duodenal ulcer with creation of omental flap. Post-operatively, patient admitted to the ICU where she remains intubated, sedated, and on mechanical ventilation. Pharmacy has been consulted to initiate TPN for prolonged NPO status.   Glucose / Insulin: Last A1c 8.3%.  24 hr glucose range 229 - 407 24 hr SSI requirements 105 units Electrolytes: Within normal limits. Hyperphosphatemia resolved Renal: Nephrology following for iHD. Scr down-trending Hepatic: Within normal limits Intake / Output; MIVF: Net negative 812 mL for the admission GI Imaging: 8/4 CT Abdomen / pelvis: Extensive pneumoperitoneum compatible with perforated viscus. Large ill-defined mass arising from the upper pole left kidney consistent with renal cell carcinoma.  Acute left-sided hydronephrosis due to a 12 mm left UPJ calculus. Chronic right hydronephrosis and hydroureter without clear etiology. GI Surgeries / Procedures:  8/5: ExLap with repair of perforated duodenal ulcer with creation of omental flap  Central access: 8/5  TPN start date:  8/5  Nutritional Goals (per RD recommendation on 8/5): kCal: 1150 - 1450 kcal/day, Protein: 100 - 125 g/day, Fluid: 1.2 - 1.5 L/day  Goal TPN rate is 65 mL/hr (provides 101 g of protein and 1379 kcals per day)  Current Nutrition: tube feeds  Plan:  --Patient started on tube feeds 8/9. Plan to  increase to goal today. --Decrease TPN to 20 mL/hr (1/3 rate per RD recommendation) --Electrolytes in TPN: Na 26mEq/L, K 60mEq/L, Ca 19mEq/L, Mg 80mEq/L, and Phos 74mmol/L. Cl:Ac 1:1 --Add standard MVI and trace elements to TPN --Expect Decadron given during procedure 8/10 having some effect on increased CBGs. Continue rSSI. Increase Levemir to 25 units BID and Novolog to 6 units q4h. Decrease insulin in TPN to 35 units given decrease in rate. --Monitor TPN labs on Mon/Thurs at minimum  Tawnya Crook, PharmD, BCPS Clinical Pharmacist 07/06/2021 11:26 AM

## 2021-07-06 NOTE — Progress Notes (Signed)
[facility]  ROUNDING NOTE   Subjective:   Heather Lopez is a 76 y.o. white female who has been admitted for community acquired pneumonia.    Hospital course complicated by duodenal perforation s/p repair on 06/30/2021 ARF - requiring HD PRN Acute resp failure requiring mech ventilation Left obstructive hydronephrosis and renal abscess  Patient remains critically ill Intubated, sedated,   Remains vent assisted and getting TPN Fio2 45% Patient underwent cystoscopy and b/l ureteral stent placement Good UOP but cloudy   Objective:  Vital signs in last 24 hours:  Temp:  [97.5 F (36.4 C)-99.5 F (37.5 C)] 97.5 F (36.4 C) (08/11 0800) Pulse Rate:  [87-107] 89 (08/11 0800) Resp:  [10-16] 13 (08/11 0800) BP: (136-180)/(48-61) 144/52 (08/11 0800) SpO2:  [96 %-99 %] 97 % (08/11 0800) Arterial Line BP: (127-182)/(46-111) 159/109 (08/11 0800) FiO2 (%):  [35 %-50 %] 45 % (08/11 0904) Weight:  [102.6 kg] 102.6 kg (08/11 0453)  Weight change:  Filed Weights   06/28/21 0100 06/30/21 0345 07/06/21 0453  Weight: 106.8 kg 102.9 kg 102.6 kg    Intake/Output: I/O last 3 completed shifts: In: 4147.3 [I.V.:2865.4; Blood:343.8; NG/GT:586; IV Piggyback:352.1] Out: 3810 [Urine:3700; Drains:110]   Intake/Output this shift:  Total I/O In: 279.6 [I.V.:79.6; NG/GT:200] Out: 615 [Urine:600; Drains:15]  Physical Exam: General: Critically ill  Head: ETT, OGT  Eyes: Anicteric  Lungs:  Vent assisted  Heart: Regular rate and rhythm  Abdomen:  +JP drains  Extremities:  trace peripheral edema.  Neurologic:  sedated  Skin: No lesions  Access:  RIJ temp cath  Left IJ PermCath-nonfunctional  Basic Metabolic Panel: Recent Labs  Lab 07/02/21 0508 07/03/21 0509 07/04/21 0440 07/05/21 0443 07/06/21 0440  NA 134* 141 145 146* 144  K 4.1 3.8 4.0 3.9 4.7  CL 102 107 110 113* 114*  CO2 '28 25 25 25 ' 21*  GLUCOSE 360* 234* 209* 235* 428*  BUN 91* 98* 93* 104* 111*  CREATININE 4.01*  3.85* 3.67* 3.79* 3.62*  CALCIUM 8.3* 8.4* 8.6* 8.7* 8.5*  MG 2.2 2.0 1.9 1.9 1.9  PHOS 5.1* 2.9 3.0 2.9 2.8     Liver Function Tests: Recent Labs  Lab 07/01/21 0508 07/03/21 0509 07/06/21 0440  AST 43* 38 42*  ALT 44 34 46*  ALKPHOS 61 77 83  BILITOT 1.1 0.4 0.3  PROT 5.7* 5.7* 5.9*  ALBUMIN 1.9* 1.6* 1.5*    No results for input(s): LIPASE, AMYLASE in the last 168 hours. No results for input(s): AMMONIA in the last 168 hours.  CBC: Recent Labs  Lab 06/30/21 0320 07/01/21 0508 07/02/21 0508 07/02/21 2245 07/03/21 0509 07/04/21 0440 07/05/21 0443 07/05/21 1244 07/05/21 2015 07/06/21 0440  WBC 30.3* 30.3* 23.2*  --  28.9* 24.4* 20.6*  --   --  14.8*  NEUTROABS 27.9* 28.1*  --   --  26.6*  --  17.3*  --   --  13.5*  HGB 10.0* 7.8* 7.5*   < > 7.4* 7.3* 6.8* 6.6* 8.0* 7.5*  HCT 28.8* 23.0* 22.1*   < > 22.3* 22.6* 21.8* 21.7* 25.6* 24.3*  MCV 81.1 81.0 83.1  --  84.8 87.3 87.6  --   --  91.0  PLT 288 231 190  --  161 145* 161  --   --  196   < > = values in this interval not displayed.     Cardiac Enzymes: No results for input(s): CKTOTAL, CKMB, CKMBINDEX, TROPONINI in the last 168 hours.  BNP:  Invalid input(s): POCBNP  CBG: Recent Labs  Lab 07/05/21 1917 07/05/21 2337 07/06/21 0351 07/06/21 0747 07/06/21 1123  GLUCAP 357* 407* 372* 399* 366*     Microbiology: Results for orders placed or performed during the hospital encounter of 06/27/21  Resp Panel by RT-PCR (Flu A&B, Covid) Nasopharyngeal Swab     Status: None   Collection Time: 06/27/21 12:32 AM   Specimen: Nasopharyngeal Swab; Nasopharyngeal(NP) swabs in vial transport medium  Result Value Ref Range Status   SARS Coronavirus 2 by RT PCR NEGATIVE NEGATIVE Final    Comment: (NOTE) SARS-CoV-2 target nucleic acids are NOT DETECTED.  The SARS-CoV-2 RNA is generally detectable in upper respiratory specimens during the acute phase of infection. The lowest concentration of SARS-CoV-2 viral copies  this assay can detect is 138 copies/mL. A negative result does not preclude SARS-Cov-2 infection and should not be used as the sole basis for treatment or other patient management decisions. A negative result may occur with  improper specimen collection/handling, submission of specimen other than nasopharyngeal swab, presence of viral mutation(s) within the areas targeted by this assay, and inadequate number of viral copies(<138 copies/mL). A negative result must be combined with clinical observations, patient history, and epidemiological information. The expected result is Negative.  Fact Sheet for Patients:  EntrepreneurPulse.com.au  Fact Sheet for Healthcare Providers:  IncredibleEmployment.be  This test is no t yet approved or cleared by the [place] FDA and  has been authorized for detection and/or diagnosis of SARS-CoV-2 by FDA under an Emergency Use Authorization (EUA). This EUA will remain  in effect (meaning this test can be used) for the duration of the COVID-19 declaration under Section 564(b)(1) of the Act, 21 U.S.C.section 360bbb-3(b)(1), unless the authorization is terminated  or revoked sooner.       Influenza A by PCR NEGATIVE NEGATIVE Final   Influenza B by PCR NEGATIVE NEGATIVE Final    Comment: (NOTE) The Xpert Xpress SARS-CoV-2/FLU/RSV plus assay is intended as an aid in the diagnosis of influenza from Nasopharyngeal swab specimens and should not be used as a sole basis for treatment. Nasal washings and aspirates are unacceptable for Xpert Xpress SARS-CoV-2/FLU/RSV testing.  Fact Sheet for Patients: EntrepreneurPulse.com.au  Fact Sheet for Healthcare Providers: IncredibleEmployment.be  This test is not yet approved or cleared by the [place] FDA and has been authorized for detection and/or diagnosis of SARS-CoV-2 by FDA under an Emergency Use Authorization (EUA). This EUA  will remain in effect (meaning this test can be used) for the duration of the COVID-19 declaration under Section 564(b)(1) of the Act, 21 U.S.C. section 360bbb-3(b)(1), unless the authorization is terminated or revoked.  Performed at [facility], [address] [place], [place] [address]   Blood culture (routine x 2)     Status: None   Collection Time: 06/27/21 12:43 AM   Specimen: BLOOD  Result Value Ref Range Status   Specimen Description BLOOD RIGHT ASSIST CONTROL  Final   Special Requests   Final    BOTTLES DRAWN AEROBIC AND ANAEROBIC Blood Culture results may not be optimal due to an inadequate volume of blood received in culture bottles   Culture   Final    NO GROWTH 5 DAYS Performed at [facility], [address], [place], [place] [address]    Report Status 07/02/2021 FINAL  Final  Blood culture (routine x 2)     Status: None   Collection Time: 06/27/21  1:45 AM   Specimen: BLOOD  Result  Value Ref Range Status   Specimen Description BLOOD RIGHT ASSIST CONTROL  Final   Special Requests   Final    BOTTLES DRAWN AEROBIC AND ANAEROBIC Blood Culture adequate volume   Culture   Final    NO GROWTH 5 DAYS Performed at [facility], [address] [place], [place] [address]    Report Status 07/02/2021 FINAL  Final  Urine Culture     Status: Abnormal   Collection Time: 06/27/21  2:39 AM   Specimen: In/Out Cath Urine  Result Value Ref Range Status   Specimen Description   Final    IN/OUT CATH URINE Performed at [facility], [address], [place], [place] [address]    Special Requests   Final    NONE Performed at [facility], [address] [place], [place] [address]    Culture 30,000 COLONIES/mL YEAST (A)  Final   Report Status 06/28/2021 FINAL  Final  MRSA Next Gen by PCR, Nasal     Status: None   Collection Time: 06/30/21  5:01 AM   Specimen: Nasal Mucosa; Nasal Swab  Result Value Ref Range Status   MRSA by  PCR Next Gen NOT DETECTED NOT DETECTED Final    Comment: (NOTE) The GeneXpert MRSA Assay (FDA approved for NASAL specimens only), is one component of a comprehensive MRSA colonization surveillance program. It is not intended to diagnose MRSA infection nor to guide or monitor treatment for MRSA infections. Test performance is not FDA approved in patients less than 31 years old. Performed at [facility], [address] [place], [place] [address]   Urine Culture     Status: None (Preliminary result)   Collection Time: 07/05/21 10:36 AM   Specimen: PATH Other; Urine  Result Value Ref Range Status   Specimen Description   Final    URINE, RANDOM LEFT RENAL PELVIS URINE Performed at [facility], [address], [place], [place] [address]    Special Requests   Final    NONE Performed at [facility], [address], [place], [place] [address]    Culture   Final    CULTURE REINCUBATED FOR BETTER GROWTH Performed at [facility], [place] [address], [place], [place] [address]    Report Status PENDING  Incomplete  Urine Culture     Status: None (Preliminary result)   Collection Time: 07/05/21 10:48 AM   Specimen: PATH Other; Urine  Result Value Ref Range Status   Specimen Description   Final    URINE, RANDOM RIGHT KIDNEY URINE Performed at [facility], [address] [place], [place] [address]    Special Requests   Final    NONE Performed at [facility], [address], [place], [place] [address]    Culture   Final    CULTURE REINCUBATED FOR BETTER GROWTH Performed at [facility], [place] [address], [place], [place] [address]    Report Status PENDING  Incomplete    Coagulation Studies: No results for input(s): LABPROT, INR in the last 72 hours.   Urinalysis: No results for input(s): COLORURINE, LABSPEC, PHURINE, GLUCOSEU, HGBUR, BILIRUBINUR, KETONESUR, PROTEINUR, UROBILINOGEN, NITRITE, LEUKOCYTESUR in the last 72  hours.  Invalid input(s): APPERANCEUR     Imaging: CT ABDOMEN PELVIS WO CONTRAST  Result Date: 07/04/2021 CLINICAL DATA:  Fever, leukocytosis status post duodenal ulcer repair. EXAM: CT ABDOMEN AND PELVIS WITHOUT CONTRAST TECHNIQUE: Multidetector CT imaging of the abdomen and pelvis was performed following  the standard protocol without IV contrast. COMPARISON:  June 29, 2021. FINDINGS: Lower chest: Mild bilateral posterior basilar subsegmental atelectasis is noted. Hepatobiliary: No focal liver abnormality is seen. No gallstones, gallbladder wall thickening, or biliary dilatation. Pancreas: Unremarkable. No pancreatic ductal dilatation or surrounding inflammatory changes. Spleen: Normal in size without focal abnormality. Adrenals/Urinary Tract: Adrenal glands appear normal. Bilateral nephrolithiasis is noted. Mild to moderate right hydroureteronephrosis is noted without obstructing calculus. Urinary bladder is decompressed secondary to Foley catheter. Mild left hydronephrosis is noted secondary to 14 mm calculus at the left ureteropelvic junction. The low density seen arising from the upper pole of the left kidney appears to be enlarged, currently measuring 7.0 x 4.2 cm. While this may represent neoplasm, the rapid increase in size suggests perinephric hematoma or abscess. Significantly increased perinephric stranding is noted anteriorly. Stomach/Bowel: Nasogastric tube is seen in the proximal stomach. Surgical drain is seen in the epigastric region between the stomach and inferior portion of the left hepatic lobe. The appendix appears normal. Mildly dilated small bowel loops are noted most consistent with postoperative ileus. There is seen wall thickening involving the descending colon concerning for infectious or inflammatory colitis. Vascular/Lymphatic: Aortic atherosclerosis. No enlarged abdominal or pelvic lymph nodes. Reproductive: Uterus and bilateral adnexa are unremarkable. Other: No abdominal wall  hernia or abnormality. No abdominopelvic ascites. Surgical drain is also seen in the pelvis without surrounding fluid collection. Musculoskeletal: No acute or significant osseous findings. IMPRESSION: Ill-defined low density arising from upper pole of left kidney appears to have significantly enlarged in size, currently measuring 7.0 x 4.2 cm. There is also noted significantly increased perinephric inflammation anteriorly in the left pararenal space, and these findings are concerning for abscess or hematoma. Further evaluation with CT scan of the abdomen with intravenous contrast is recommended. Bilateral nephrolithiasis is noted. Mild to moderate right hydroureteronephrosis is noted without obstructing calculus. Mild left hydronephrosis is noted secondary to 14 mm calculus at the left ureteropelvic junction. Surgical drain is seen between the stomach and inferior portion of the left hepatic lobe, as well as another surgical drain seen within the pelvis. No definite abscess is seen in this area. Irregular wall thickening is seen involving the descending colon concerning for infectious or inflammatory colitis. Mild bilateral posterior basilar subsegmental atelectasis is noted. Aortic Atherosclerosis (ICD10-I70.0). Electronically Signed   By: Marijo Conception M.D.   On: 07/04/2021 15:03   DG OR UROLOGY CYSTO IMAGE ([place])  Result Date: 07/05/2021 There is no interpretation for this exam.  This order is for images obtained during a surgical procedure.  Please See "Surgeries" Tab for more information regarding the procedure.     Medications:    sodium chloride     dexmedetomidine (PRECEDEX) IV infusion Stopped (07/04/21 1050)   esmolol Stopped (07/04/21 1035)   fentaNYL infusion INTRAVENOUS 50 mcg/hr (07/06/21 0800)   fluconazole (DIFLUCAN) IV Stopped (07/05/21 2022)   piperacillin-tazobactam (ZOSYN)  IV 2.25 g (07/06/21 1203)   TPN ADULT (ION) 65 mL/hr at 07/06/21 0800   TPN ADULT (ION)       chlorhexidine gluconate (MEDLINE KIT)  15 mL Mouth Rinse BID   Chlorhexidine Gluconate Cloth  6 each Topical Q0600   feeding supplement (PIVOT 1.5 CAL)  1,000 mL Per Tube Q24H   feeding supplement (PROSource TF)  45 mL Per Tube TID   free water  30 mL Per Tube Q4H   heparin  5,000 Units Subcutaneous Q8H   insulin aspart  0-20 Units Subcutaneous Q4H  insulin aspart  6 Units Subcutaneous Q4H   insulin detemir  25 Units Subcutaneous BID   mouth rinse  15 mL Mouth Rinse 10 times per day   [START ON 07/08/2021] multivitamin  15 mL Per Tube Daily   nutrition supplement (JUVEN)  1 packet Per Tube BID BM   pantoprazole (PROTONIX) IV  40 mg Intravenous Q12H     Assessment/ Plan:  Ms. YANINA KNUPP is a 75 y.o.  female who has been admitted for community acquired pneumonia.    Acute Kidney Injury on chronic kidney disease stage 4 with baseline creatinine 1.97 and GFR of 25 on 12/22/19.  Acute kidney injury secondary to sepsis and obstructive uropathy Chronic kidney disease is secondary to diabetic nephropathy Continue holding losartan and metformin  - S creatinine trends worse today  -Electrolytes and Volume status are acceptable -No acute indication for Dialysis at present   Lab Results  Component Value Date   CREATININE 3.62 (H) 07/06/2021   CREATININE 3.79 (H) 07/05/2021   CREATININE 3.67 (H) 07/04/2021    Intake/Output Summary (Last 24 hours) at 07/06/2021 1207 Last data filed at 07/06/2021 0800 Gross per 24 hour  Intake 2524.21 ml  Output 3795 ml  Net -1270.79 ml    2. Acute Respiratory failure secondary to community acquired pneumonia failing outpatient antibiotics.  Requiring mechanical ventilation.   3. Anemia of chronic kidney disease Normocytic Lab Results  Component Value Date   HGB 7.5 (L) 07/06/2021   Low threshold to initiate ESA Dose given 07/01/21  4.  Diabetes mellitus type II with chronic kidney disease insulin dependent. Most recent hemoglobin A1c is 8.3 on  06/12/21.  Metformin held    5.Bilateral Nephrolithiasis- with obstructive uropathy and left renal abscess Right - Mild to moderate chronic hydronephrosis without obstructing stone Left-mild left hydronephrosis with 14 mm stone at left UPJ- b/l ureteral stent placed 8/10 Lesion at upper pole of left kidney enlarged - likely hematoma or abscess  6. S/p  duodenal perforation s/p repair on 06/30/2021 Getting TPN     LOS: 9 Jacari Kirsten 8/11/202212:07 PM

## 2021-07-06 NOTE — Progress Notes (Signed)
Inpatient Diabetes Program Recommendations  AACE/ADA: New Consensus Statement on Inpatient Glycemic Control (2015)  Target Ranges:  Prepandial:   less than 140 mg/dL      Peak postprandial:   less than 180 mg/dL (1-2 hours)      Critically ill patients:  140 - 180 mg/dL   Results for Heather Lopez, Heather Lopez (MRN [id number]) as of 07/06/2021 08:36  Ref. Range 07/05/2021 11:20 07/05/2021 15:39 07/05/2021 19:17 07/05/2021 23:37 07/06/2021 03:51 07/06/2021 07:47  Glucose-Capillary Latest Ref Range: 70 - 99 mg/dL 233 (H)  10 units NOVOLOG  272 (H)  14 units NOVOLOG  357 (H)  23 units NOVOLOG  20 units Levemir @2148  407 (H)  23 units NOVOLOG  372 (H)  23 units NOVOLOG 399 (H)  23 units NOVOLOG      Home DM Meds: Novolog 6 units TID with meals + Correction                             Levemir 52 units BID                             Metformin 1000 mg BID                             Trulicity 5.03 mg Qweek  Current Orders: Levemir 20 units BID    Novolog 0-20 units Q4H    Novolog 3 units Q4H    TPN @ 65 ml/hr with 70 units of insulin     Pivot tube feeds     MD- Note patient received 10 mg Decadron X 1 dose yest at 10 am for Urology procedure  Note plan to Increase tube feedings and eventually stop the TPN  Note patient takes Levemir 52 units BID at home per Home Med Rec  Please consider:  1. Increase Levemir to 25 units BID (50% home dose)  2. Increase Novolog Tube Feed Coverage to 6 units Q4 hours HOLD if tube feeds HELD for any reason     --Will follow patient during hospitalization--  Wyn Quaker RN, MSN, CDE Diabetes Coordinator Inpatient Glycemic Control Team Team Pager: [phone] (8a-5p)

## 2021-07-06 NOTE — Progress Notes (Signed)
[facility] Day(s): 9.   Post op day(s): 1 Day Post-Op.   Interval History:  Patient seen and examined Underwent cystoscopy and bilateral ureteral stent placement yesterday with Dr Bernardo Heater, MD Remains intubated Leukocytosis continues to improve Renal function remains elevated but stable; sCr - 3.62; UO - 3.1L No significant electrolyte derangements  Surgical drains:             - Right mid abdomen (draining repair site); 50 ccs; serous             - RLQ (draining dependent pelvis): 30 ccs; serous Tube feedings advancing; 30 ccs/hr Continues on TPN; goal  Vital signs in last 24 hours: [min-max] current  Temp:  [97.5 F (36.4 C)-101.1 F (38.4 C)] 97.9 F (36.6 C) (08/11 0600) Pulse Rate:  [87-107] 100 (08/11 0600) Resp:  [10-21] 14 (08/11 0600) BP: (129-180)/(48-73) 148/50 (08/11 0600) SpO2:  [91 %-100 %] 97 % (08/11 0600) Arterial Line BP: (127-182)/(44-111) 165/50 (08/11 0600) FiO2 (%):  [30 %-50 %] 35 % (08/11 0405) Weight:  [102.6 kg] 102.6 kg (08/11 0453)     Height: 5' 2.01" (157.5 cm) Weight: 102.6 kg BMI (Calculated): 41.36   Intake/Output last 2 shifts:  08/10 0701 - 08/11 0700 In: 2526.1 [I.V.:1831.6; Blood:343.8; NG/GT:150; IV Piggyback:200.7] Out: 3180 [Urine:3100; Drains:80]   Physical Exam:  Constitutional: intubated, sedated HEENT: NGT in place; receiving tube feedings Respiratory: intubated Cardiovascular: regular rate, sinus rhythm Gastrointestinal: Soft, unable to reliably assess tenderness, non-distended, surgical drains x2 in right lower abdomen, output serous Integumentary: Midline incision is CDI with staples and penrose drain, no erythema, no drainage  Labs:  CBC Latest Ref Rng & Units 07/06/2021 07/05/2021 07/05/2021  WBC 4.0 - 10.5 K/uL 14.8(H) - -  Hemoglobin 12.0 - 15.0 g/dL 7.5(L) 8.0(L) 6.6(L)  Hematocrit 36.0 - 46.0 % 24.3(L) 25.6(L) 21.7(L)  Platelets 150 - 400 K/uL 196 - -   CMP Latest  Ref Rng & Units 07/06/2021 07/05/2021 07/04/2021  Glucose 70 - 99 mg/dL 428(H) 235(H) 209(H)  BUN 8 - 23 mg/dL 111(H) 104(H) 93(H)  Creatinine 0.44 - 1.00 mg/dL 3.62(H) 3.79(H) 3.67(H)  Sodium 135 - 145 mmol/L 144 146(H) 145  Potassium 3.5 - 5.1 mmol/L 4.7 3.9 4.0  Chloride 98 - 111 mmol/L 114(H) 113(H) 110  CO2 22 - 32 mmol/L 21(L) 25 25  Calcium 8.9 - 10.3 mg/dL 8.5(L) 8.7(L) 8.6(L)  Total Protein 6.5 - 8.1 g/dL 5.9(L) - -  Total Bilirubin 0.3 - 1.2 mg/dL 0.3 - -  Alkaline Phos 38 - 126 U/L 83 - -  AST 15 - 41 U/L 42(H) - -  ALT 0 - 44 U/L 46(H) - -     Imaging studies: No new pertinent imaging studies   Assessment/Plan:  75 y.o. female 7 days s/p exploratory laparotomy and primary repair of duodenal ulcer with omental patch    - Appreciate PCCM support; extubate per their service   - Continue tube feedings; advance to goal  - Continue TPN; at goal rate. Continue until tube feedings reach goal  - Continue IV Abx (Zosyn); Day 7 - Monitor abdominal examination; on-going bowel function - Maintain surgical drains; monitor and record output    All of the above findings and recommendations were discussed with the medical team.   -- Edison Simon, PA-C [facility] 07/06/2021, 7:15 AM [phone] M-F: 7am - 4pm

## 2021-07-07 ENCOUNTER — Inpatient Hospital Stay: Payer: Medicare Other

## 2021-07-07 DIAGNOSIS — N12 Tubulo-interstitial nephritis, not specified as acute or chronic: Secondary | ICD-10-CM

## 2021-07-07 DIAGNOSIS — B3749 Other urogenital candidiasis: Secondary | ICD-10-CM

## 2021-07-07 DIAGNOSIS — N200 Calculus of kidney: Secondary | ICD-10-CM

## 2021-07-07 DIAGNOSIS — Z978 Presence of other specified devices: Secondary | ICD-10-CM

## 2021-07-07 DIAGNOSIS — K631 Perforation of intestine (nontraumatic): Secondary | ICD-10-CM | POA: Diagnosis not present

## 2021-07-07 DIAGNOSIS — I4892 Unspecified atrial flutter: Secondary | ICD-10-CM

## 2021-07-07 DIAGNOSIS — N151 Renal and perinephric abscess: Secondary | ICD-10-CM | POA: Diagnosis not present

## 2021-07-07 DIAGNOSIS — D649 Anemia, unspecified: Secondary | ICD-10-CM

## 2021-07-07 DIAGNOSIS — D638 Anemia in other chronic diseases classified elsewhere: Secondary | ICD-10-CM

## 2021-07-07 DIAGNOSIS — J9601 Acute respiratory failure with hypoxia: Secondary | ICD-10-CM | POA: Diagnosis not present

## 2021-07-07 DIAGNOSIS — N170 Acute kidney failure with tubular necrosis: Secondary | ICD-10-CM | POA: Diagnosis not present

## 2021-07-07 LAB — CBC
HCT: 26.7 % — ABNORMAL LOW (ref 36.0–46.0)
Hemoglobin: 8 g/dL — ABNORMAL LOW (ref 12.0–15.0)
MCH: 27.4 pg (ref 26.0–34.0)
MCHC: 30 g/dL (ref 30.0–36.0)
MCV: 91.4 fL (ref 80.0–100.0)
Platelets: 252 10*3/uL (ref 150–400)
RBC: 2.92 MIL/uL — ABNORMAL LOW (ref 3.87–5.11)
RDW: 16.3 % — ABNORMAL HIGH (ref 11.5–15.5)
WBC: 14.2 10*3/uL — ABNORMAL HIGH (ref 4.0–10.5)
nRBC: 0.2 % (ref 0.0–0.2)

## 2021-07-07 LAB — MAGNESIUM: Magnesium: 1.8 mg/dL (ref 1.7–2.4)

## 2021-07-07 LAB — BASIC METABOLIC PANEL
Anion gap: 7 (ref 5–15)
Anion gap: 10 (ref 5–15)
BUN: 138 mg/dL — ABNORMAL HIGH (ref 8–23)
BUN: 90 mg/dL — ABNORMAL HIGH (ref 8–23)
CO2: 24 mmol/L (ref 22–32)
CO2: 25 mmol/L (ref 22–32)
Calcium: 9 mg/dL (ref 8.9–10.3)
Calcium: 7.8 mg/dL — ABNORMAL LOW (ref 8.9–10.3)
Chloride: 124 mmol/L — ABNORMAL HIGH (ref 98–111)
Chloride: 102 mmol/L (ref 98–111)
Creatinine, Ser: 3.74 mg/dL — ABNORMAL HIGH (ref 0.44–1.00)
Creatinine, Ser: 2.37 mg/dL — ABNORMAL HIGH (ref 0.44–1.00)
GFR, Estimated: 12 mL/min — ABNORMAL LOW (ref >60)
GFR, Estimated: 21 mL/min — ABNORMAL LOW (ref >60)
Glucose, Bld: 164 mg/dL — ABNORMAL HIGH (ref 70–99)
Glucose, Bld: 412 mg/dL — ABNORMAL HIGH (ref 70–99)
Potassium: 4.4 mmol/L (ref 3.5–5.1)
Potassium: 4.3 mmol/L (ref 3.5–5.1)
Sodium: 155 mmol/L — ABNORMAL HIGH (ref 135–145)
Sodium: 137 mmol/L (ref 135–145)

## 2021-07-07 LAB — HEMOGLOBIN A1C
Hgb A1c MFr Bld: 8.2 % — ABNORMAL HIGH (ref 4.8–5.6)
Mean Plasma Glucose: 188.64 mg/dL

## 2021-07-07 LAB — GLUCOSE, CAPILLARY
Glucose-Capillary: 138 mg/dL — ABNORMAL HIGH (ref 70–99)
Glucose-Capillary: 144 mg/dL — ABNORMAL HIGH (ref 70–99)
Glucose-Capillary: 154 mg/dL — ABNORMAL HIGH (ref 70–99)
Glucose-Capillary: 157 mg/dL — ABNORMAL HIGH (ref 70–99)
Glucose-Capillary: 159 mg/dL — ABNORMAL HIGH (ref 70–99)
Glucose-Capillary: 169 mg/dL — ABNORMAL HIGH (ref 70–99)
Glucose-Capillary: 177 mg/dL — ABNORMAL HIGH (ref 70–99)
Glucose-Capillary: 178 mg/dL — ABNORMAL HIGH (ref 70–99)
Glucose-Capillary: 201 mg/dL — ABNORMAL HIGH (ref 70–99)
Glucose-Capillary: 237 mg/dL — ABNORMAL HIGH (ref 70–99)
Glucose-Capillary: 258 mg/dL — ABNORMAL HIGH (ref 70–99)
Glucose-Capillary: 355 mg/dL — ABNORMAL HIGH (ref 70–99)
Glucose-Capillary: 384 mg/dL — ABNORMAL HIGH (ref 70–99)
Glucose-Capillary: 92 mg/dL (ref 70–99)

## 2021-07-07 LAB — PHOSPHORUS: Phosphorus: 3 mg/dL (ref 2.5–4.6)

## 2021-07-07 MED ORDER — INSULIN ASPART 100 UNIT/ML IJ SOLN
0.0000 [IU] | INTRAMUSCULAR | Status: DC
Start: 2021-07-07 — End: 2021-07-08
  Administered 2021-07-07: 4 [IU] via SUBCUTANEOUS
  Administered 2021-07-07: 20 [IU] via SUBCUTANEOUS
  Filled 2021-07-07 (×2): qty 1

## 2021-07-07 MED ORDER — HEPARIN (PORCINE) 25000 UT/250ML-% IV SOLN
1600.0000 [IU]/h | INTRAVENOUS | Status: DC
  Administered 2021-07-07: 1100 [IU]/h via INTRAVENOUS
  Administered 2021-07-08: 1200 [IU]/h via INTRAVENOUS
  Administered 2021-07-09 – 2021-07-10 (×3): 1600 [IU]/h via INTRAVENOUS
  Filled 2021-07-07 (×5): qty 250

## 2021-07-07 MED ORDER — METOPROLOL TARTRATE 5 MG/5ML IV SOLN
INTRAVENOUS | Status: AC
  Administered 2021-07-07: 5 mg via INTRAVENOUS
  Filled 2021-07-07: qty 5

## 2021-07-07 MED ORDER — HEPARIN BOLUS VIA INFUSION
3800.0000 [IU] | Freq: Once | INTRAVENOUS | Status: DC
  Filled 2021-07-07: qty 3800

## 2021-07-07 MED ORDER — DILTIAZEM HCL-DEXTROSE 125-5 MG/125ML-% IV SOLN (PREMIX)
5.0000 mg/h | INTRAVENOUS | Status: DC
  Administered 2021-07-07 – 2021-07-08 (×2): 5 mg/h via INTRAVENOUS
  Administered 2021-07-09 – 2021-07-10 (×2): 10 mg/h via INTRAVENOUS
  Filled 2021-07-07 (×4): qty 125

## 2021-07-07 MED ORDER — SODIUM CHLORIDE 0.9 % IV SOLN
100.0000 mg | INTRAVENOUS | Status: DC
  Filled 2021-07-07: qty 100

## 2021-07-07 MED ORDER — METOPROLOL TARTRATE 5 MG/5ML IV SOLN
5.0000 mg | Freq: Once | INTRAVENOUS | Status: AC
  Administered 2021-07-07: 5 mg via INTRAVENOUS

## 2021-07-07 MED ORDER — METOPROLOL TARTRATE 5 MG/5ML IV SOLN
INTRAVENOUS | Status: AC
  Filled 2021-07-07: qty 5

## 2021-07-07 MED ORDER — MAGNESIUM SULFATE IN D5W 1-5 GM/100ML-% IV SOLN
1.0000 g | Freq: Once | INTRAVENOUS | Status: AC
  Administered 2021-07-07: 1 g via INTRAVENOUS
  Filled 2021-07-07: qty 100

## 2021-07-07 MED ORDER — FREE WATER
100.0000 mL | Status: DC
  Administered 2021-07-07 – 2021-07-11 (×28): 100 mL

## 2021-07-07 MED ORDER — INSULIN ASPART 100 UNIT/ML IJ SOLN
10.0000 [IU] | Freq: Once | INTRAMUSCULAR | Status: AC
  Administered 2021-07-07: 10 [IU] via SUBCUTANEOUS
  Filled 2021-07-07: qty 1

## 2021-07-07 MED ORDER — HEPARIN BOLUS VIA INFUSION
3800.0000 [IU] | Freq: Once | INTRAVENOUS | Status: AC
  Administered 2021-07-07: 3800 [IU] via INTRAVENOUS
  Filled 2021-07-07: qty 3800

## 2021-07-07 MED ORDER — METOPROLOL TARTRATE 5 MG/5ML IV SOLN: 5.0000 mg | Freq: Once | INTRAVENOUS | Status: AC

## 2021-07-07 MED ORDER — INSULIN ASPART 100 UNIT/ML IJ SOLN
4.0000 [IU] | INTRAMUSCULAR | Status: DC
  Administered 2021-07-07: 4 [IU] via SUBCUTANEOUS
  Filled 2021-07-07: qty 1

## 2021-07-07 MED ORDER — FREE WATER
200.0000 mL | Status: DC
  Administered 2021-07-07 (×3): 200 mL

## 2021-07-07 MED ORDER — SODIUM CHLORIDE 0.9 % IV SOLN
200.0000 mg | Freq: Once | INTRAVENOUS | Status: AC
  Administered 2021-07-07: 200 mg via INTRAVENOUS
  Filled 2021-07-07: qty 200

## 2021-07-07 MED ORDER — FLUCONAZOLE IN SODIUM CHLORIDE 400-0.9 MG/200ML-% IV SOLN
300.0000 mg | INTRAVENOUS | Status: DC
  Filled 2021-07-07: qty 150

## 2021-07-07 MED ORDER — INSULIN DETEMIR 100 UNIT/ML ~~LOC~~ SOLN
25.0000 [IU] | Freq: Two times a day (BID) | SUBCUTANEOUS | Status: DC
  Administered 2021-07-07 (×2): 25 [IU] via SUBCUTANEOUS
  Filled 2021-07-07 (×3): qty 0.25

## 2021-07-07 MED ORDER — FLUCONAZOLE IN SODIUM CHLORIDE 400-0.9 MG/200ML-% IV SOLN
400.0000 mg | INTRAVENOUS | Status: DC
  Administered 2021-07-07 – 2021-07-16 (×10): 400 mg via INTRAVENOUS
  Filled 2021-07-07 (×12): qty 200

## 2021-07-07 MED ORDER — INSULIN ASPART 100 UNIT/ML IJ SOLN
6.0000 [IU] | Freq: Once | INTRAMUSCULAR | Status: AC
  Administered 2021-07-07: 6 [IU] via SUBCUTANEOUS
  Filled 2021-07-07: qty 1

## 2021-07-07 MED ORDER — METOPROLOL TARTRATE 5 MG/5ML IV SOLN: 2.5000 mg | Freq: Four times a day (QID) | INTRAVENOUS | Status: DC

## 2021-07-07 MED ORDER — INSULIN ASPART 100 UNIT/ML IJ SOLN: 8.0000 [IU] | INTRAMUSCULAR | Status: DC

## 2021-07-07 MED ORDER — METOPROLOL TARTRATE 5 MG/5ML IV SOLN
5.0000 mg | Freq: Four times a day (QID) | INTRAVENOUS | Status: DC
  Administered 2021-07-08 – 2021-07-12 (×14): 5 mg via INTRAVENOUS
  Filled 2021-07-07 (×14): qty 5

## 2021-07-07 NOTE — Progress Notes (Signed)
Spoke with Dr. Patsey Berthold and let her know that patient has been on cardizem drip since 1635 and current infusion is 15mg /H with heart rate remaining around 149 and stable blood pressure. MD came to bedside and ordering metoprolol IV.

## 2021-07-07 NOTE — Consult Note (Signed)
NAME: Heather Lopez  DOB: 05/07/1946  MRN: [id number]  Date/Time: 07/07/2021 2:42 PM  REQUESTING PROVIDER: Dr. Gonzalez Subjective:  REASON FOR CONSULT: candiduria with renal abscess ? Heather Lopez is a 75 y.o. female with a history of DM, HTN,b/l nephrolithiasis, hydronephrosis h/o stent in the past  presents to the ED on 06/27/2021 with difficulty in breathing and cough   On 06/23/21 seen in urgicare for cough and body aches and given levaquin and prednisone for possible pneumonia.  Patient was still feeling weak and having poor appetite and unable to eat anything.  Also was feeling short of breath.  In the ED temperature was 97.6, pulse ox 88% on room air and she was placed on 10 L oxygen, WBC 24.4, creatinine was 9.32 increased from 1.97 on 12/22/2019.  Potassium was 5.5 and sodium was 124.  Bicarb was 9.  Hemoglobin was 10.4.  She was placed on bicarb drip.  And she also received D50 IV insulin.  COVID test was negative.  Chest x-ray showed possible infiltrate for which patient was started on antibiotics.  While in the hospital she had a episode of diarrhea.  As patient had adequate urine output dialysis was not considered in the beginning.  Dialysis catheter was placed on 06/29/2021.  On the night of 06/29/2021 patient developed severe abdominal pain and was in need of increased oxygen.  Urgent CT abdomen and pelvis was ordered and that showed a large amount of pneumoperitoneum with associated free fluid.  There was also noted a left renal mass measuring 6.1 into 8 and 4.1 cm suspicious for renal cell carcinoma.  Patient was taken to the OR the early morning of 06/30/2021 and underwent exploratory laparotomy, primary repair of the perforated duodenal ulcer with creation of omental flap.  Following which she was admitted to the ICU.  Remained intubated.  She was started on dialysis.  As she was spiking fevers with leukocytosis repeat CT scan was done on 07/04/2021 and that revealed a 14 mm left UPJ stone with mild left  hydronephrosis and left upper pole renal lesion concerning for possible hematoma or abscess.  Right hydroureteronephrosis.  Stable.  Urine culture was yeast urology saw the patient and she underwent cystoscopy with bilateral ureteral stent placement.  Left pyelonephrosis was seen.  Culture was sent from left renal pelvis urine and it showed 70,000 colonies of yeast.  The right kidney urine was also sent at that had more than 100,000 colonies of yeast.  Patient had been on fluconazole but as there was persistent candiduria I am asked to see the patient. Past Medical History:  Diagnosis Date   Anxiety    Cataract    Depression    Diabetes mellitus without complication (HCC)    Gout    History of kidney stones    Hyperlipidemia    Hypertension    Vertigo     Past Surgical History:  Procedure Laterality Date   CATARACT EXTRACTION, BILATERAL Bilateral 2019   COLONOSCOPY WITH PROPOFOL N/A 08/19/2015   Procedure: COLONOSCOPY WITH PROPOFOL;  Surgeon: Darren Wohl, MD;  Location: [facility];  Service: Endoscopy;  Laterality: N/A;  diabetic - insulin   CYSTOSCOPY W/ RETROGRADES Bilateral 12/22/2019   Procedure: CYSTOSCOPY WITH RETROGRADE PYELOGRAM;  Surgeon: Stoioff, Scott C, MD;  Location: [facility];  Service: Urology;  Laterality: Bilateral;   CYSTOSCOPY W/ URETERAL STENT PLACEMENT Bilateral 07/05/2021   Procedure: CYSTOSCOPY WITH RETROGRADE PYELOGRAM/BILATERAL URETERAL STENT PLACEMENT;  Surgeon: Stoioff, Scott C, MD;  Location:   [facility];  Service: Urology;  Laterality: Bilateral;   CYSTOSCOPY WITH BIOPSY N/A 12/22/2019   Procedure: CYSTOSCOPY WITH bladder BIOPSY;  Surgeon: Stoioff, Scott C, MD;  Location: [facility];  Service: Urology;  Laterality: N/A;   CYSTOSCOPY WITH STENT PLACEMENT Right 12/22/2019   Procedure: CYSTOSCOPY WITH STENT PLACEMENT;  Surgeon: Stoioff, Scott C, MD;  Location: [facility];  Service: Urology;  Laterality: Right;   CYSTOSCOPY WITH URETEROSCOPY Bilateral 12/22/2019    Procedure: CYSTOSCOPY WITH URETEROSCOPY;  Surgeon: Stoioff, Scott C, MD;  Location: [facility];  Service: Urology;  Laterality: Bilateral;   CYSTOSCOPY/URETEROSCOPY/HOLMIUM LASER/STENT PLACEMENT Right 12/22/2019   Procedure: CYSTOSCOPY/URETEROSCOPY/HOLMIUM LASER/STENT PLACEMENT;  Surgeon: Stoioff, Scott C, MD;  Location: [facility];  Service: Urology;  Laterality: Right;   DIALYSIS/PERMA CATHETER INSERTION N/A 06/29/2021   Procedure: DIALYSIS/PERMA CATHETER INSERTION;  Surgeon: Dew, Jason S, MD;  Location: [facility];  Service: Cardiovascular;  Laterality: N/A;   EYE SURGERY     LAPAROTOMY N/A 06/29/2021   Procedure: EXPLORATORY LAPAROTOMY WITH REPAIR OF DUODENAL PERFORATION;  Surgeon: Piscoya, Jose, MD;  Location: [facility];  Service: General;  Laterality: N/A;   POLYPECTOMY  08/19/2015   Procedure: POLYPECTOMY;  Surgeon: Darren Wohl, MD;  Location: [facility];  Service: Endoscopy;;   TUBAL LIGATION  1992    Social History   Socioeconomic History   Marital status: Married    Spouse name: Not on file   Number of children: Not on file   Years of education: Not on file   Highest education level: Not on file  Occupational History   Not on file  Tobacco Use   Smoking status: Former    Packs/day: 0.75    Years: 6.00    Pack years: 4.50    Types: Cigarettes    Quit date: 1973    Years since quitting: 49.6   Smokeless tobacco: Never   Tobacco comments:    quit 1973  Vaping Use   Vaping Use: Never used  Substance and Sexual Activity   Alcohol use: No    Alcohol/week: 0.0 standard drinks   Drug use: No   Sexual activity: Not on file  Other Topics Concern   Not on file  Social History Narrative   Not on file   Social Determinants of Health   Financial Resource Strain: Not on file  Food Insecurity: Not on file  Transportation Needs: Not on file  Physical Activity: Not on file  Stress: Not on file  Social Connections: Not on file  Intimate Partner Violence: Not on  file    Family History  Problem Relation Age of Onset   Diabetes Father    Cancer Father        lung cancer   Diabetes Brother    Stroke Mother    Allergies  Allergen Reactions   Contrast Media  [Iodinated Diagnostic Agents] Anaphylaxis   Iodine Swelling    (IV only) - angioedema   I? Current Facility-Administered Medications  Medication Dose Route Frequency Provider Last Rate Last Admin   0.9 %  sodium chloride infusion  100 mL Intravenous PRN Dew, Jason S, MD       acetaminophen (TYLENOL) tablet 650 mg  650 mg Per Tube Q6H PRN Graves, Dana E, NP   650 mg at 07/06/21 1652   alteplase (CATHFLO ACTIVASE) injection 2 mg  2 mg Intracatheter Once PRN Dew, Jason S, MD       anidulafungin (ERAXIS) 200 mg in sodium chloride 0.9 %   200 mL IVPB  200 mg Intravenous Once Tyler Pita, MD 78 mL/hr at 07/07/21 1256 200 mg at 07/07/21 1256   Followed by   Derrill Memo ON 07/08/2021] anidulafungin (ERAXIS) 100 mg in sodium chloride 0.9 % 100 mL IVPB  100 mg Intravenous Q24H Tyler Pita, MD       chlorhexidine gluconate (MEDLINE KIT) (PERIDEX) 0.12 % solution 15 mL  15 mL Mouth Rinse BID Ottie Glazier, MD   15 mL at 07/07/21 0818   Chlorhexidine Gluconate Cloth 2 % PADS 6 each  6 each Topical Q0600 Algernon Huxley, MD   6 each at 07/07/21 0536   dexmedetomidine (PRECEDEX) 400 MCG/100ML (4 mcg/mL) infusion  0.4-1.2 mcg/kg/hr Intravenous Titrated Ottie Glazier, MD   Stopped at 07/04/21 1050   dextrose 50 % solution 0-50 mL  0-50 mL Intravenous PRN Flora Lipps, MD       feeding supplement (PIVOT 1.5 CAL) liquid 1,000 mL  1,000 mL Per Tube Q24H Flora Lipps, MD 40 mL/hr at 07/06/21 1716 IV Pump Association at 07/06/21 1716   feeding supplement (PROSource TF) liquid 45 mL  45 mL Per Tube TID Flora Lipps, MD   45 mL at 07/07/21 1003   fentaNYL (SUBLIMAZE) bolus via infusion 25-100 mcg  25-100 mcg Intravenous Q1H PRN Lang Snow, NP   50 mcg at 07/07/21 0817   fentaNYL 2542mg in NS  2523m(1035mml) infusion-PREMIX  25-200 mcg/hr Intravenous Continuous OumLang SnowP 20 mL/hr at 07/07/21 1200 200 mcg/hr at 07/07/21 1200   free water 200 mL  200 mL Per Tube Q3H SinMurlean IbaD   200 mL at 07/07/21 1135   heparin injection 1,000 Units  1,000 Units Dialysis PRN DewAlgernon HuxleyD   1,000 Units at 07/07/21 1406   heparin injection 5,000 Units  5,000 Units Subcutaneous Q8H GonTyler PitaD   5,000 Units at 07/07/21 1304   insulin aspart (novoLOG) injection 0-20 Units  0-20 Units Subcutaneous Q4H GonTyler PitaD       insulin aspart (novoLOG) injection 4 Units  4 Units Subcutaneous Q4H GonTyler PitaD       insulin detemir (LEVEMIR) injection 25 Units  25 Units Subcutaneous BID GonTyler PitaD   25 Units at 07/07/21 1304   insulin regular, human (MYXREDLIN) 100 units/ 100 mL infusion   Intravenous Continuous KasFlora LippsD 8 mL/hr at 07/07/21 1208 8 Units/hr at 07/07/21 1208   labetalol (NORMODYNE) injection 10-20 mg  10-20 mg Intravenous Q10 min PRN KasFlora LippsD   20 mg at 07/05/21 0751   lidocaine (PF) (XYLOCAINE) 1 % injection 5 mL  5 mL Intradermal PRN DewAlgernon HuxleyD       lidocaine-prilocaine (EMLA) cream 1 application  1 application Topical PRN DewAlgernon HuxleyD       MEDLINE mouth rinse  15 mL Mouth Rinse 10 times per day AleOttie GlazierD   15 mL at 07/07/21 1140   midazolam (VERSED) injection 1-2 mg  1-2 mg Intravenous Q1H PRN KasFlora LippsD   1 mg at 07/07/21 1031   [START ON 07/08/2021] multivitamin liquid 15 mL  15 mL Per Tube Daily KasFlora LippsD       nutrition supplement (JUVEN) (JUVEN) powder packet 1 packet  1 packet Per Tube BID BM KasFlora LippsD   1 packet at 07/07/21 1003   ondansetron (ZOFRAN) injection 4 mg  4 mg Intravenous Q6H PRN Dew,  Erskine Squibb, MD       pantoprazole (PROTONIX) injection 40 mg  40 mg Intravenous Q12H Ottie Glazier, MD   40 mg at 07/07/21 1002   pentafluoroprop-tetrafluoroeth (GEBAUERS)  aerosol 1 application  1 application Topical PRN Algernon Huxley, MD       piperacillin-tazobactam (ZOSYN) IVPB 2.25 g  2.25 g Intravenous Q6H Flora Lipps, MD 100 mL/hr at 07/07/21 1200 Infusion Verify at 07/07/21 1200     Abtx:  Anti-infectives (From admission, onward)    Start     Dose/Rate Route Frequency Ordered Stop   07/08/21 1200  anidulafungin (ERAXIS) 100 mg in sodium chloride 0.9 % 100 mL IVPB       See Hyperspace for full Linked Orders Report.   100 mg 78 mL/hr over 100 Minutes Intravenous Every 24 hours 07/07/21 1015     07/07/21 1200  anidulafungin (ERAXIS) 200 mg in sodium chloride 0.9 % 200 mL IVPB       See Hyperspace for full Linked Orders Report.   200 mg 78 mL/hr over 200 Minutes Intravenous  Once 07/07/21 1015     07/05/21 2200  piperacillin-tazobactam (ZOSYN) IVPB 2.25 g        2.25 g 100 mL/hr over 30 Minutes Intravenous Every 6 hours 07/05/21 1557 07/08/21 2359   07/01/21 1800  fluconazole (DIFLUCAN) IVPB 200 mg  Status:  Discontinued        200 mg 100 mL/hr over 60 Minutes Intravenous Every 24 hours 06/30/21 1328 07/07/21 1015   06/30/21 1800  fluconazole (DIFLUCAN) IVPB 400 mg        400 mg 100 mL/hr over 120 Minutes Intravenous  Once 06/30/21 1328 06/30/21 2205   06/30/21 0500  ceFAZolin (ANCEF) IVPB 1 g/50 mL premix  Status:  Discontinued       Note to Pharmacy: To be given in specials   1 g 100 mL/hr over 30 Minutes Intravenous  Once 06/29/21 0703 06/29/21 1637   06/30/21 0430  fluconazole (DIFLUCAN) IVPB 400 mg  Status:  Discontinued        400 mg 100 mL/hr over 120 Minutes Intravenous Every 24 hours 06/30/21 0337 06/30/21 1326   06/29/21 2330  piperacillin-tazobactam (ZOSYN) IVPB 2.25 g  Status:  Discontinued        2.25 g 100 mL/hr over 30 Minutes Intravenous Every 8 hours 06/29/21 2240 07/05/21 1557   06/29/21 1236  ceFAZolin (ANCEF) 1-4 GM/50ML-% IVPB       Note to Pharmacy: Corlis Hove   : cabinet override      06/29/21 1236 06/30/21 0044    06/28/21 1600  fluconazole (DIFLUCAN) tablet 150 mg        150 mg Oral  Once 06/28/21 1507 06/28/21 1649   06/27/21 0145  cefTRIAXone (ROCEPHIN) 2 g in sodium chloride 0.9 % 100 mL IVPB  Status:  Discontinued        2 g 200 mL/hr over 30 Minutes Intravenous Every 24 hours 06/27/21 0137 06/29/21 2302   06/27/21 0145  azithromycin (ZITHROMAX) 500 mg in sodium chloride 0.9 % 250 mL IVPB  Status:  Discontinued        500 mg 250 mL/hr over 60 Minutes Intravenous Every 24 hours 06/27/21 0137 06/29/21 1138       REVIEW OF SYSTEMS:  NA   Objective:  VITALS:  BP 106/65   Pulse (!) 30   Temp (!) 96.3 F (35.7 C)   Resp 13   Ht 5' 2" (1.575 m)  Wt 106 kg   SpO2 100%   BMI 42.74 kg/m   PHYSICAL EXAM:  General: Intubated, sedated on the vent.  Increased BMI Head: Normocephalic, without obvious abnormality, atraumatic. Eyes: Conjunctivae clear, anicteric sclerae. Pupils are equal Neck: symmetrical, no adenopathy, thyroid: non tender no carotid bruit and no JVD. Back: Did not examine Lungs: Bilateral air entry.  Decreased in the bases Heart: A. fib with RVR  abdomen: Soft, laparotomy surgical site healing well staples.  No erythema or discharge.  Drain present  Extremities: atraumatic, no cyanosis. No edema. No clubbing Skin: Under the breasts maceration of the skin with intertrigo Lymph: Cervical, supraclavicular normal. Neurologic: Cannot assess Foley catheter milky urine Pertinent Labs Lab Results CBC      Component Value Date/Time   WBC 14.2 (H) 07/07/2021 0545   RBC 2.92 (L) 07/07/2021 0545   HGB 8.0 (L) 07/07/2021 0545   HGB 13.9 01/29/2014 0406   HCT 26.7 (L) 07/07/2021 0545   HCT 42.4 01/28/2014 0513   PLT 252 07/07/2021 0545   PLT 217 01/29/2014 0406   MCV 91.4 07/07/2021 0545   MCV 89 01/28/2014 0513   MCH 27.4 07/07/2021 0545   MCHC 30.0 07/07/2021 0545   RDW 16.3 (H) 07/07/2021 0545   RDW 14.2 01/28/2014 0513   LYMPHSABS 0.4 (L) 07/06/2021 0440    LYMPHSABS 1.3 01/28/2014 0513   MONOABS 0.5 07/06/2021 0440   MONOABS 0.4 01/28/2014 0513   EOSABS 0.0 07/06/2021 0440   EOSABS 0.0 01/28/2014 0513   BASOSABS 0.0 07/06/2021 0440   BASOSABS 0.1 01/28/2014 0513    CMP Latest Ref Rng & Units 07/07/2021 07/06/2021 07/05/2021  Glucose 70 - 99 mg/dL 164(H) 428(H) 235(H)  BUN 8 - 23 mg/dL 138(H) 111(H) 104(H)  Creatinine 0.44 - 1.00 mg/dL 3.74(H) 3.62(H) 3.79(H)  Sodium 135 - 145 mmol/L 155(H) 144 146(H)  Potassium 3.5 - 5.1 mmol/L 4.4 4.7 3.9  Chloride 98 - 111 mmol/L 124(H) 114(H) 113(H)  CO2 22 - 32 mmol/L 24 21(L) 25  Calcium 8.9 - 10.3 mg/dL 9.0 8.5(L) 8.7(L)  Total Protein 6.5 - 8.1 g/dL - 5.9(L) -  Total Bilirubin 0.3 - 1.2 mg/dL - 0.3 -  Alkaline Phos 38 - 126 U/L - 83 -  AST 15 - 41 U/L - 42(H) -  ALT 0 - 44 U/L - 46(H) -      Microbiology: Recent Results (from the past 240 hour(s))  MRSA Next Gen by PCR, Nasal     Status: None   Collection Time: 06/30/21  5:01 AM   Specimen: Nasal Mucosa; Nasal Swab  Result Value Ref Range Status   MRSA by PCR Next Gen NOT DETECTED NOT DETECTED Final    Comment: (NOTE) The GeneXpert MRSA Assay (FDA approved for NASAL specimens only), is one component of a comprehensive MRSA colonization surveillance program. It is not intended to diagnose MRSA infection nor to guide or monitor treatment for MRSA infections. Test performance is not FDA approved in patients less than 81 years old. Performed at [facility], [address], [place], [place] [address]   Urine Culture     Status: Abnormal   Collection Time: 07/05/21 10:36 AM   Specimen: PATH Other; Urine  Result Value Ref Range Status   Specimen Description   Final    URINE, RANDOM LEFT RENAL PELVIS URINE Performed at [facility], [address], [place], [place] [address]    Special Requests   Final    NONE Performed at [facility]  [facility], [address] [place], [place] [address]    Culture 70,000  COLONIES/mL YEAST (A)  Final   Report Status 07/06/2021 FINAL  Final  Urine Culture     Status: Abnormal   Collection Time: 07/05/21 10:48 AM   Specimen: PATH Other; Urine  Result Value Ref Range Status   Specimen Description   Final    URINE, RANDOM RIGHT KIDNEY URINE Performed at [facility], [address] [place], [place] [address]    Special Requests   Final    NONE Performed at [facility], [address] [place], [place] [address]    Culture >=100,000 COLONIES/mL YEAST (A)  Final   Report Status 07/06/2021 FINAL  Final    IMAGING RESULTS:  I have personally reviewed the films ? Impression/Recommendation Patient admitted with shortness of breath, possible pneumonia.  Next developed severe abdominal pain Duodenal perforation surgery on 06/30/2021 with closure of the perforation with omental flap.  On Zosyn.  He may be able to stop the Zosyn as WBC is improving. AKI.  On hemodialysis   Left hydronephrosis with UPJ obstruction Underwent stenting on 07/05/2021 Urine cultures from the bilateral kidney shows Candida. Patient has been on 200 mg of fluconazole which may not be enough for her weight so we will increase to 400 mg anidulafungin  is not excreted in the urine and hence will not use and UTI.  Left renal upper pole mass which is a hematoma versus abscess.  Agree with repeating the CAT scan.  It is an abscess it should be drained.  Anemia  A. fib starting on diltiazem  Discussed the management with the requesting provider. [facility] is on  call this weekend ? ? Note:  This document was prepared using Dragon voice recognition software and may include unintentional dictation errors.

## 2021-07-07 NOTE — TOC Progression Note (Signed)
Transition of Care [facility] - Progression Note    Patient Details  Name: Heather Lopez MRN: [id number] Date of Birth: September 25, 1946  Transition of Care [facility]) CM/SW [place], [place] Phone Number: [phone] 07/07/2021, 3:47 PM  Clinical Narrative:     Patient remains intubated and sedated.  Palliative Care NP following.  Cystoscopy with Bilateral ureteral stent placement (52F/24 cm) for LEFT OBSTRUCTED KIDNEY STONE on 07/05/2021. Patient failed SAT/SBT on 07/06/2021. Insulin drip started for FSBS>400.   Expected Discharge Plan: [place] Barriers to Discharge: Continued Medical Work up  Expected Discharge Plan and Services Expected Discharge Plan: [place] In-house Referral: Clinical Social Work   Post Acute Care Choice: [place] Living arrangements for the past 2 months: Single Family Home                                       Social Determinants of Health (SDOH) Interventions    Readmission Risk Interventions No flowsheet data found.

## 2021-07-07 NOTE — Progress Notes (Signed)
Pt transported on vent to & from CT without incident

## 2021-07-07 NOTE — Progress Notes (Signed)
Inpatient Diabetes Program Recommendations  AACE/ADA: New Consensus Statement on Inpatient Glycemic Control   Target Ranges:  Prepandial:   less than 140 mg/dL      Peak postprandial:   less than 180 mg/dL (1-2 hours)      Critically ill patients:  140 - 180 mg/dL   Results for Heather Lopez, Heather Lopez (MRN [id number]) as of 07/07/2021 10:50  Ref. Range 07/07/2021 00:32 07/07/2021 01:28 07/07/2021 02:26 07/07/2021 03:30 07/07/2021 04:24 07/07/2021 05:37 07/07/2021 07:46 07/07/2021 08:45  Glucose-Capillary Latest Ref Range: 70 - 99 mg/dL 258 (H) 237 (H) 201 (H) 177 (H) 178 (H) 157 (H) 138 (H) 154 (H)    Review of Glycemic Control  Diabetes history: DM2 Outpatient Diabetes medications: Levemir 52 units BID, Novolog 6 units TID with meals + Correction,Metformin 3846 mg BID, Trulicity 6.59 mg Qweek Current Orders: IV insulin; TPN @ 20 ml/hr with 35 units of insulin, Pivot @ 40 ml/hr  Inpatient Diabetes Program Recommendations:    Insulin: Noted patient was transitioned from SQ to IV insulin last night, TPN is being weaned off and Pivot tube feedings ordered at 40 ml/hr. Once provider is ready to transition back to SQ insulin, please consider ordering Levemir 25 units BID, CBGs Q4H, Novolog 0-20 units Q4H, and Novolog 4 units Q4H for tube feeding coverage.  Thanks, Barnie Alderman, RN, MSN, CDE Diabetes Coordinator Inpatient Diabetes Program [phone] (Team Pager from 8am to 5pm)

## 2021-07-07 NOTE — Progress Notes (Signed)
NAME:  QUINITA KOSTELECKY, MRN:  [id number], DOB:  11/04/1946, LOS: 38 ADMISSION DATE:  06/27/2021, CONSULTATION DATE:  06/30/21 REFERRING MD:  Hampton Abbot, MD  CHIEF COMPLAINT:  Abdominal Pain   HPI  75 y.o with significant PMH as below who presented to the ED on 06/27/21 with chief complaints of progressive shortness of breath, productive cough, loss of appetite, nausea, dry heaving fatigue, malaise and generalized body aches. Patient initially went to her PCP on 7/29 with complaints of rhinorrhea, harsh cough, loss of appetite, nausea, fatigue, malaise and generalized body aches x2 days, patient was diagnosed with pneumonia of which she received ceftriaxone 1 mg IM and sent home with Levaquin, prednisone and Tessalon.  Per ED reports, patient reported progressive worsening of her symptoms during ED visit.   Past Medical History  Diabetes mellitus type 2 Anxiety and depression Hypertension Hyperlipidemia Hydronephrosis and recurrent UTI Gout and osteoarthritis Significant Hospital Events   8/2: Admitted to MedSurg unit with sepsis 8/3: Vascular consulted for HD catheter 8/4: Patient developed acute abdomen.  CT abdomen shows viscus perforation.  Patient taken to the OR urgently for exploratory lap.  Patient return to ICU on vent.  PCCM consulted 07/01/21- patient had SBT today for recruitment and chest physiotherapy.  Still has significant tachycardia and hypertension when awake (possible pain related) but mentation is good able to follow verbal communication.  07/02/21-patient has failed SBT today due to hypoxemia.  Reviewed care plan with surgery team.  07/03/21- Lung V/Q scan obtained ~ negative for PE.  Weaning FiO2, currently at 50% 8/9 severe resp failure, remains on vent 8/10 s/p Cystoscopy with Bilateral ureteral stent placement (57F/24 cm) for LEFT OBSTRUCTED KIDNEY STONE 8/11 remains on VENT 8/11 failed SAT/SBT  8/12 started on insulin drip for FSBS >400   Consults:   Nephrology Vascular PCCM General Surgery, UROLOGY  Procedures:  8/5: Exploratory Lap   Significant Diagnostic Tests:  8/2: Chest Xray>left basilar consolidation or atelectasis and suspected small left pleural effusion 8/2: Renal ultrasound> moderate right hydronephrosis, mild left hydronephrosis new with bilateral nephrolithiasis 8/4: CTA abdomen and pelvis> Extensive pneumoperitoneum compatible with perforated viscus. Gastric perforation is suspected, though bowel wall defect is not definitively identified. Surgical consultation is recommended. 2. Large ill-defined mass arising from the upper pole left kidney consistent with renal cell carcinoma. Dedicated renal MRI may be useful when clinical situation permits. 3. Acute left-sided hydronephrosis due to a 12 mm left UPJ calculus. 4. Chronic right hydronephrosis and hydroureter without clear etiology. 5. Bilateral nonobstructing renal calculi as above. 6. Small volume ascites. 7. Trace bilateral pleural effusions with patchy bibasilar atelectasis. 8/7: Venous US BLE>> negative for DVT 8/8: Lung V/Q Scan>>Negative for pulmonary embolus.  Micro Data:  8/2: SARS-CoV-2 PCR> negative 8/2: Influenza PCR> negative 8/2: Blood culture x2> no growth 8/2: Urine Culture> 30,000 colonies yeast 8/2: Legionella urinary antigen> negative  Antimicrobials:  8/4 Fluconazole> 8/4 Zosyn>  INTERVAL CHANGES Remains intubated Failied weaning trails S/p urteral stents placed Remains critically ill S/p bowel per ABG with metabolic acidosis Weaning off TPN Started insulin drip  OBJECTIVE  Blood pressure (!) 147/58, pulse (!) 106, temperature 97.9 F (36.6 C), resp. rate 14, height 5' 2.01" (1.575 m), weight 106 kg, SpO2 100 %.    Vent Mode: PRVC FiO2 (%):  [35 %-55 %] 45 % Set Rate:  [14 bmp] 14 bmp Vt Set:  [490 mL] 490 mL PEEP:  [5 cmH20] 5 cmH20 Pressure Support:  [5 cmH20] 5 cmH20 Plateau Pressure:  [  15 cmH20-16 cmH20] 16 cmH20    Intake/Output Summary (Last 24 hours) at 07/07/2021 0726 Last data filed at 07/07/2021 5102 Gross per 24 hour  Intake 2340.94 ml  Output 3728 ml  Net -1387.06 ml    Filed Weights   06/30/21 0345 07/06/21 0453 07/07/21 0500  Weight: 102.9 kg 102.6 kg 106 kg   REVIEW OF SYSTEMS  PATIENT IS UNABLE TO PROVIDE COMPLETE REVIEW OF SYSTEMS DUE TO SEVERE CRITICAL ILLNESS AND TOXIC METABOLIC ENCEPHALOPATHY  PHYSICAL EXAMINATION:  GENERAL:critically ill appearing, +resp distress EYES: Pupils equal, round, reactive to light.  No scleral icterus.  MOUTH: Moist mucosal membrane. INTUBATED NECK: Supple.  PULMONARY: +rhonchi, +wheezing CARDIOVASCULAR: S1 and S2.  No murmurs  GASTROINTESTINAL: Distended, Midline incision with honey comb dressing dry and intact. JP Drains x 2 MUSCULOSKELETAL: edema.  NEUROLOGIC: obtunded SKIN:intact,warm,dry     Labs/imaging that I havepersonally reviewed  (right click and "Reselect all SmartList Selections" daily)     Labs   CBC: Recent Labs  Lab 07/01/21 0508 07/02/21 0508 07/03/21 0509 07/04/21 0440 07/05/21 0443 07/05/21 1244 07/05/21 2015 07/06/21 0440 07/07/21 0545  WBC 30.3*   < > 28.9* 24.4* 20.6*  --   --  14.8* 14.2*  NEUTROABS 28.1*  --  26.6*  --  17.3*  --   --  13.5*  --   HGB 7.8*   < > 7.4* 7.3* 6.8* 6.6* 8.0* 7.5* 8.0*  HCT 23.0*   < > 22.3* 22.6* 21.8* 21.7* 25.6* 24.3* 26.7*  MCV 81.0   < > 84.8 87.3 87.6  --   --  91.0 91.4  PLT 231   < > 161 145* 161  --   --  196 252   < > = values in this interval not displayed.     Basic Metabolic Panel: Recent Labs  Lab 07/02/21 0508 07/03/21 0509 07/04/21 0440 07/05/21 0443 07/06/21 0440 07/07/21 0550  NA 134* 141 145 146* 144 155*  K 4.1 3.8 4.0 3.9 4.7 4.4  CL 102 107 110 113* 114* 124*  CO2 '28 25 25 25 ' 21* 24  GLUCOSE 360* 234* 209* 235* 428* 164*  BUN 91* 98* 93* 104* 111* 138*  CREATININE 4.01* 3.85* 3.67* 3.79* 3.62* 3.74*  CALCIUM 8.3* 8.4* 8.6* 8.7* 8.5* 9.0   MG 2.2 2.0 1.9 1.9 1.9  --   PHOS 5.1* 2.9 3.0 2.9 2.8  --     GFR: Estimated Creatinine Clearance: 15.1 mL/min (A) (by C-G formula based on SCr of 3.74 mg/dL (H)). Recent Labs  Lab 07/04/21 0440 07/05/21 0443 07/06/21 0440 07/07/21 0545  WBC 24.4* 20.6* 14.8* 14.2*     Liver Function Tests: Recent Labs  Lab 07/01/21 0508 07/03/21 0509 07/06/21 0440  AST 43* 38 42*  ALT 44 34 46*  ALKPHOS 61 77 83  BILITOT 1.1 0.4 0.3  PROT 5.7* 5.7* 5.9*  ALBUMIN 1.9* 1.6* 1.5*    No results for input(s): LIPASE, AMYLASE in the last 168 hours. No results for input(s): AMMONIA in the last 168 hours.  ABG    Component Value Date/Time   PHART 7.25 (L) 07/06/2021 0951   PCO2ART 46 07/06/2021 0951   PO2ART 57 (L) 07/06/2021 0951   HCO3 20.2 07/06/2021 0951   ACIDBASEDEF 6.7 (H) 07/06/2021 0951   O2SAT 83.9 07/06/2021 0951     Past Medical History  She,  has a past medical history of Anxiety, Cataract, Depression, Diabetes mellitus without complication ([place]), Gout, History of kidney stones, Hyperlipidemia,  Hypertension, and Vertigo.   Surgical History    Past Surgical History:  Procedure Laterality Date   CATARACT EXTRACTION, BILATERAL Bilateral 2019   COLONOSCOPY WITH PROPOFOL N/A 08/19/2015   Procedure: COLONOSCOPY WITH PROPOFOL;  Surgeon: Lucilla Lame, MD;  Location: [place];  Service: Endoscopy;  Laterality: N/A;  diabetic - insulin   CYSTOSCOPY W/ RETROGRADES Bilateral 12/22/2019   Procedure: CYSTOSCOPY WITH RETROGRADE PYELOGRAM;  Surgeon: Abbie Sons, MD;  Location: [facility];  Service: Urology;  Laterality: Bilateral;   CYSTOSCOPY W/ URETERAL STENT PLACEMENT Bilateral 07/05/2021   Procedure: CYSTOSCOPY WITH RETROGRADE PYELOGRAM/BILATERAL URETERAL STENT PLACEMENT;  Surgeon: Abbie Sons, MD;  Location: [facility];  Service: Urology;  Laterality: Bilateral;   CYSTOSCOPY WITH BIOPSY N/A 12/22/2019   Procedure: CYSTOSCOPY WITH bladder BIOPSY;  Surgeon: Abbie Sons, MD;  Location: [facility];  Service: Urology;  Laterality: N/A;   CYSTOSCOPY WITH STENT PLACEMENT Right 12/22/2019   Procedure: CYSTOSCOPY WITH STENT PLACEMENT;  Surgeon: Abbie Sons, MD;  Location: [facility];  Service: Urology;  Laterality: Right;   CYSTOSCOPY WITH URETEROSCOPY Bilateral 12/22/2019   Procedure: CYSTOSCOPY WITH URETEROSCOPY;  Surgeon: Abbie Sons, MD;  Location: [facility];  Service: Urology;  Laterality: Bilateral;   CYSTOSCOPY/URETEROSCOPY/HOLMIUM LASER/STENT PLACEMENT Right 12/22/2019   Procedure: CYSTOSCOPY/URETEROSCOPY/HOLMIUM LASER/STENT PLACEMENT;  Surgeon: Abbie Sons, MD;  Location: [facility];  Service: Urology;  Laterality: Right;   DIALYSIS/PERMA CATHETER INSERTION N/A 06/29/2021   Procedure: DIALYSIS/PERMA CATHETER INSERTION;  Surgeon: Algernon Huxley, MD;  Location: [place] [facility];  Service: Cardiovascular;  Laterality: N/A;   EYE SURGERY     LAPAROTOMY N/A 06/29/2021   Procedure: EXPLORATORY LAPAROTOMY WITH REPAIR OF DUODENAL PERFORATION;  Surgeon: [name], MD;  Location: [facility];  Service: General;  Laterality: N/A;   POLYPECTOMY  08/19/2015   Procedure: POLYPECTOMY;  Surgeon: Lucilla Lame, MD;  Location: [place];  Service: Endoscopy;;   TUBAL LIGATION  1992    Allergies Allergies  Allergen Reactions   Contrast Media  [Iodinated Diagnostic Agents] Anaphylaxis   Iodine Swelling    (IV only) - angioedema    Active Hospital Problem list   Acute hypoxic respiratory failure Severe sepsis with septic shock Bowel perforation AKI on CKD stage IV Right UPJ stone with hydronephrosis Anion gap metabolic acidosis Lactic acidosis Diabetes mellitus  Assessment & Plan:    75 yo white female with acute severe sepsis and shock due to perforated Duodenal Ulcer complicated by acute resp failure with CAP effusions and atelectasis with stage 4 CKD with left obstructing kidney stone s/p stents   Severe ACUTE Hypoxic and Hypercapnic  Respiratory Failure -continue Mechanical Ventilator support -continue Bronchodilator Therapy -Wean Fio2 and PEEP as tolerated -VAP/VENT bundle implementation -will perform SAT/SBT when respiratory parameters are met 8/11 failed SAT/SBT   INFECTIOUS DISEASE Severe Sepsis secondary to UTI, Community Acquired Pneumonia  -S/p exploratory laparotomy and primary repair of duodenal ulcer with omental patch on 06/29/21   SEPTIC shock-resolving SOURCE-per abd   Renal Failure CKD 4 -continue Foley Catheter-assess need -Avoid nephrotoxic agents -Follow urine output, BMP -Ensure adequate renal perfusion, optimize oxygenation -Renal dose medications Patient will likely need HD session today BUN 138   Intake/Output Summary (Last 24 hours) at 07/07/2021 0730 Last data filed at 07/07/2021 4650 Gross per 24 hour  Intake 2340.94 ml  Output 3728 ml  Net -1387.06 ml   ENDO - ICU hypoglycemic\Hyperglycemia protocol -check FSBS per protocol On insulin infusion   GI  GI PROPHYLAXIS as indicated  NUTRITIONAL STATUS DIET-->TF's as tolerated Wean off TPN Constipation protocol as indicated   ELECTROLYTES -follow labs as needed -replace as needed -pharmacy consultation and following     CBC    Component Value Date/Time   WBC 14.2 (H) 07/07/2021 0545   RBC 2.92 (L) 07/07/2021 0545   HGB 8.0 (L) 07/07/2021 0545   HGB 13.9 01/29/2014 0406   HCT 26.7 (L) 07/07/2021 0545   HCT 42.4 01/28/2014 0513   PLT 252 07/07/2021 0545   PLT 217 01/29/2014 0406   MCV 91.4 07/07/2021 0545   MCV 89 01/28/2014 0513   MCH 27.4 07/07/2021 0545   MCHC 30.0 07/07/2021 0545   RDW 16.3 (H) 07/07/2021 0545   RDW 14.2 01/28/2014 0513   LYMPHSABS 0.4 (L) 07/06/2021 0440   LYMPHSABS 1.3 01/28/2014 0513   MONOABS 0.5 07/06/2021 0440   MONOABS 0.4 01/28/2014 0513   EOSABS 0.0 07/06/2021 0440   EOSABS 0.0 01/28/2014 0513   BASOSABS 0.0 07/06/2021 0440   BASOSABS 0.1 01/28/2014 0513   BMP Latest Ref  Rng & Units 07/07/2021 07/06/2021 07/05/2021  Glucose 70 - 99 mg/dL 164(H) 428(H) 235(H)  BUN 8 - 23 mg/dL 138(H) 111(H) 104(H)  Creatinine 0.44 - 1.00 mg/dL 3.74(H) 3.62(H) 3.79(H)  BUN/Creat Ratio 6 - 22 (calc) - - -  Sodium 135 - 145 mmol/L 155(H) 144 146(H)  Potassium 3.5 - 5.1 mmol/L 4.4 4.7 3.9  Chloride 98 - 111 mmol/L 124(H) 114(H) 113(H)  CO2 22 - 32 mmol/L 24 21(L) 25  Calcium 8.9 - 10.3 mg/dL 9.0 8.5(L) 8.7(L)      Best practice:  Diet:  NPO, TPN Pain/Anxiety/Delirium protocol (if indicated): Yes (RASS goal 0) VAP protocol (if indicated): Yes DVT prophylaxis: Heparin gtt GI prophylaxis: PPI Glucose control:  SSI Yes Central venous access:  Yes, and it is still needed Arterial line:  Yes, and it is still needed Foley:  Yes, and it is still needed Mobility:  bed rest  PT consulted: N/A Last date of multidisciplinary goals of care discussion [8/8] Code Status:  full code Disposition: ICU     DVT/GI PRX  assessed I Assessed the need for Labs I Assessed the need for Foley I Assessed the need for Central Venous Line Family Discussion when available I Assessed the need for Mobilization I made an Assessment of medications to be adjusted accordingly Safety Risk assessment completed  CASE DISCUSSED IN MULTIDISCIPLINARY ROUNDS WITH ICU TEAM   Critical Care Time devoted to patient care services described in this note is 45 minutes.  Critical care was necessary to treat /prevent imminent and life-threatening deterioration. Overall, patient is critically ill, prognosis is guarded.  Patient with Multiorgan failure and at high risk for cardiac arrest and death.    Corrin Parker, M.D.  [facility]  Medical Director [place] Director [facility] Cardio-Pulmonary Department

## 2021-07-07 NOTE — Consult Note (Signed)
Pharmacy Antibiotic Note  Heather Lopez is a 75 y.o. female with medical history including HTN, diabetes, HLD, anxiety/depression, gout who is admitted on 06/27/2021 with  acute renal failure, acute respiratory failure secondary to pneumonia . Hospital course complicated by perforated duodenal ulcer and patient was emergently taken to OR 8/5 for ExLap, repair of perforated duodenal ulcer with creation of omental flap. Post-operatively, patient admitted to the ICU where she remains on mechanical ventilation. Patient found to have left UPJ calculus with hydronephrosis with concern for abscess on CT abdomen/pelvis. Patient underwent cystoscopy with stent placement 8/10. Urine culture on admission with yeast; repeat cultures taken from OR with yeast. Patient has been on fluconazole as well as Zosyn for possible intra-abdominal infection. Given persistent yeast on cultures while on fluconazole, patient started on Eraxis 8/12. Plan for repeat CT abdomen/pelvis and ID consult.   Plan: Today 8/12 Patient continues to have good urine output, but with worsening creatinine and BUN trend today. Nephrology plans to dialyze today. Continue Zosyn 2.25 g IV q6h Start Eraxis 200 mg IV today followed by 100 mg IV q24h starting tomorrow Follow up ID consult for further antimicrobial adjustments  Height: 5\' 2"  (157.5 cm) Weight: 106 kg (233 lb 11 oz) IBW/kg (Calculated) : 50.1  Temp (24hrs), Avg:97.6 F (36.4 C), Min:95.4 F (35.2 C), Max:100 F (37.8 C)  Recent Labs  Lab 07/03/21 0509 07/04/21 0440 07/05/21 0443 07/06/21 0440 07/07/21 0545 07/07/21 0550  WBC 28.9* 24.4* 20.6* 14.8* 14.2*  --   CREATININE 3.85* 3.67* 3.79* 3.62*  --  3.74*     Estimated Creatinine Clearance: 15.1 mL/min (A) (by C-G formula based on SCr of 3.74 mg/dL (H)).    Allergies  Allergen Reactions   Contrast Media  [Iodinated Diagnostic Agents] Anaphylaxis   Iodine Swelling    (IV only) - angioedema    Antimicrobials this  admission: Azithromycin 8/2 >> 8/4 Ceftriaxone 8/2 >> 8/4 Diflucan 8/5 >> 8/11 Zosyn 8/4 >> Eraxis 8/12 >>   Microbiology results: 8/10 UCx (right kidney): > 100,000 colonies/mL yeast 8/10 UCx (left kidney): 70,000 colonies/mL yeast 8/2 BCx: NGTD 8/2 UCx: 30k colonies/mL Yeast  8/5 MRSA PCR: (-)  Thank you for allowing pharmacy to be a part of this patient's care.  Tawnya Crook, PharmD, BCPS Clinical Pharmacist 07/07/2021 11:38 AM

## 2021-07-07 NOTE — TOC Progression Note (Signed)
Transition of Care [facility]) - Progression Note    Patient Details  Name: Heather Lopez MRN: [id number] Date of Birth: [birth year]  Transition of Care [facility]) CM/SW [place], [place] Phone Number: [phone] 07/07/2021, 3:13 PM  Clinical Narrative:     Patient presents to [facility] due to 5 days of cough, congestion, loss of appetite, nausea, dry heaving, fatigue, malaise, body aches. Patient saw her PCP and is negative COVID and flu.  She was diagnosed with pneumonia and put on Levaquin and steroids. Patient was placed on SEPSIS protocol. Renal ultrasound found to have bilateral hydronephrosis. Patient has a history of obstructive uropathy and has required ureteral stents in the past.   Collateral information form patient's spouse [name], [name] [phone] [facility] Phone).  The patient works and is mostly independent but has experienced ambulation issues die to bilateral hip pain.  Patient was scheduled for hip surgery on 07/19/2021. Patient used either a cane or wheelchair to ambulate.  Patient complained of abdominal pain and presented for surgery w/ the diagnosis of ESRD for a DIALYSIS/PERMA CATHETER INSERTION (N/A) as a surgical intervention. Patient started dialysis on 06/29/2021.  CT of abdomen performed: pneumoperitoneum consistent with perforated viscus likely stomach, patient sent to OR on 06/29/2021.  Expected Discharge Plan: [place] Barriers to Discharge: Continued Medical Work up  Expected Discharge Plan and Services Expected Discharge Plan: [place] In-house Referral: Clinical Social Work   Post Acute Care Choice: [place] Living arrangements for the past 2 months: Single Family Home                                       Social Determinants of Health (SDOH) Interventions    Readmission Risk Interventions No flowsheet data found.

## 2021-07-07 NOTE — Progress Notes (Signed)
[place] Hospital Day(s): 10.   Post op day(s): 8 Days Post-Op.   Interval History:  Patient seen and examined No acute events overnight Remains intubated Leukocytosis continues to improve; down to 14.2K Renal function remains elevated but stable; sCr - pending; UO - 3.7L Surgical drains:             - Right mid abdomen (draining repair site); 8 ccs; serous             - RLQ (draining dependent pelvis): 20 ccs; serous Tube feedings advancing; 40 ccs/hr; at goal  Now off TPN Having bowel movements   Vital signs in last 24 hours: [min-max] current  Temp:  [97 F (36.1 C)-100 F (37.8 C)] 97.9 F (36.6 C) (08/12 0600) Pulse Rate:  [89-130] 106 (08/12 0600) Resp:  [12-25] 14 (08/12 0600) BP: (138-189)/(52-68) 147/58 (08/12 0600) SpO2:  [89 %-100 %] 100 % (08/12 0600) Arterial Line BP: (120-199)/(40-129) 134/93 (08/12 0600) FiO2 (%):  [35 %-55 %] 45 % (08/12 0400) Weight:  [106 kg] 106 kg (08/12 0500)     Height: 5' 2.01" (157.5 cm) Weight: 106 kg BMI (Calculated): 42.73   Intake/Output last 2 shifts:  08/11 0701 - 08/12 0700 In: 2340.9 [I.V.:1365.9; NG/GT:775; IV Piggyback:200] Out: 2130 [Urine:3700; Drains:28]   Physical Exam:  Constitutional: intubated, sedated HEENT: NGT in place; receiving tube feedings Respiratory: intubated Cardiovascular: tachycardic to 105, sinus rhythm Gastrointestinal: Soft, unable to reliably assess tenderness, non-distended, surgical drains x2 in right lower abdomen, output serous in both  Genitourinary: Foley in place  Integumentary: Midline incision is CDI with staples and penrose drain, no erythema, no drainage  Labs:  CBC Latest Ref Rng & Units 07/07/2021 07/06/2021 07/05/2021  WBC 4.0 - 10.5 K/uL 14.2(H) 14.8(H) -  Hemoglobin 12.0 - 15.0 g/dL 8.0(L) 7.5(L) 8.0(L)  Hematocrit 36.0 - 46.0 % 26.7(L) 24.3(L) 25.6(L)  Platelets 150 - 400 K/uL 252 196 -   CMP Latest Ref Rng & Units 07/06/2021  07/05/2021 07/04/2021  Glucose 70 - 99 mg/dL 428(H) 235(H) 209(H)  BUN 8 - 23 mg/dL 111(H) 104(H) 93(H)  Creatinine 0.44 - 1.00 mg/dL 3.62(H) 3.79(H) 3.67(H)  Sodium 135 - 145 mmol/L 144 146(H) 145  Potassium 3.5 - 5.1 mmol/L 4.7 3.9 4.0  Chloride 98 - 111 mmol/L 114(H) 113(H) 110  CO2 22 - 32 mmol/L 21(L) 25 25  Calcium 8.9 - 10.3 mg/dL 8.5(L) 8.7(L) 8.6(L)  Total Protein 6.5 - 8.1 g/dL 5.9(L) - -  Total Bilirubin 0.3 - 1.2 mg/dL 0.3 - -  Alkaline Phos 38 - 126 U/L 83 - -  AST 15 - 41 U/L 42(H) - -  ALT 0 - 44 U/L 46(H) - -     Imaging studies: No new pertinent imaging studies   Assessment/Plan:  75 y.o. female 8 days s/p exploratory laparotomy and primary repair of duodenal ulcer with omental patch    - Appreciate PCCM support; extubate per their service   - Continue tube feedings; at goal  - Continue IV Abx (Zosyn); Day 8 - Appreciate urology assistance; follow up UCx (growing yeast); on Diflucan  - Monitor abdominal examination; on-going bowel function - Maintain surgical drains; monitor and record output    All of the above findings and recommendations were discussed with the medical team.   -- Edison Simon, PA-C [facility] 07/07/2021, 7:06 AM [phone] M-F: 7am - 4pm

## 2021-07-07 NOTE — Progress Notes (Signed)
Made Dr. Patsey Berthold aware in person that metoprolol IV was given and heart rate has decreased with rate 1 teens atrial flutter. MD acknowledged and stated she would order scheduled metoprolol.

## 2021-07-07 NOTE — Progress Notes (Signed)
Patient completed dialysis treatment as ordered, no fluid removed. No adverse effects noted. Patient continue on ventilation support, sustainable blood pressure and heart rate. Dressing change completed. Report given to floor nurse Aloha Gell RN.

## 2021-07-07 NOTE — Progress Notes (Signed)
Daily Progress Note   Patient Name: Heather Lopez       Date: 07/07/2021 DOB: [birth year]  Age: 75 y.o. MRN#: [id number] Attending Physician: Tyler Pita, MD Primary Care Physician: [name], PA-C Admit Date: 06/27/2021  Reason for Consultation/Follow-up: Establishing goals of care  Subjective: Patient is resting in be, she remains on ventilator, tolerating tube feeds. Spoke with CCM, changing antifungals with ID consult requested. No family at bedside. Will follow up on Monday to determine status and will check in with husband.    Length of Stay: 10  Current Medications: Scheduled Meds:   chlorhexidine gluconate (MEDLINE KIT)  15 mL Mouth Rinse BID   Chlorhexidine Gluconate Cloth  6 each Topical Q0600   feeding supplement (PIVOT 1.5 CAL)  1,000 mL Per Tube Q24H   feeding supplement (PROSource TF)  45 mL Per Tube TID   free water  200 mL Per Tube Q3H   heparin  5,000 Units Subcutaneous Q8H   insulin aspart  0-20 Units Subcutaneous Q4H   insulin aspart  4 Units Subcutaneous Q4H   insulin detemir  25 Units Subcutaneous BID   mouth rinse  15 mL Mouth Rinse 10 times per day   [START ON 07/08/2021] multivitamin  15 mL Per Tube Daily   nutrition supplement (JUVEN)  1 packet Per Tube BID BM   pantoprazole (PROTONIX) IV  40 mg Intravenous Q12H    Continuous Infusions:  sodium chloride     anidulafungin 200 mg (07/07/21 1256)   Followed by   Derrill Memo ON 07/08/2021] anidulafungin     dexmedetomidine (PRECEDEX) IV infusion Stopped (07/04/21 1050)   fentaNYL infusion INTRAVENOUS 200 mcg/hr (07/07/21 1200)   insulin 8 Units/hr (07/07/21 1208)   piperacillin-tazobactam (ZOSYN)  IV 100 mL/hr at 07/07/21 1200    PRN Meds: sodium chloride, acetaminophen, alteplase, dextrose, fentaNYL,  heparin, labetalol, lidocaine (PF), lidocaine-prilocaine, midazolam, ondansetron (ZOFRAN) IV, pentafluoroprop-tetrafluoroeth  Physical Exam Constitutional:      Comments: Eyes closed.   Pulmonary:     Comments: On ventilator.            Vital Signs: BP (!) 146/63   Pulse 87   Temp (!) 96.8 F (36 C)   Resp 13   Ht '5\' 2"'  (1.575 m)   Wt 106 kg   SpO2 99%  BMI 42.74 kg/m  SpO2: SpO2: 99 % O2 Device: O2 Device: Ventilator O2 Flow Rate: O2 Flow Rate (L/min): 10 L/min  Intake/output summary:  Intake/Output Summary (Last 24 hours) at 07/07/2021 1509 Last data filed at 07/07/2021 1200 Gross per 24 hour  Intake 2694.41 ml  Output 2638 ml  Net 56.41 ml   LBM: Last BM Date: 07/07/21 Baseline Weight: Weight: 99.8 kg Most recent weight: Weight: 106 kg         Patient Active Problem List   Diagnosis Date Noted   Pressure injury of skin 06/30/2021   Pneumoperitoneum    Acute respiratory failure with hypoxia ([place]) 06/27/2021   ARF (acute renal failure) ([place]) 06/27/2021   Severe sepsis with septic shock ([place]) 06/27/2021   Acute lower UTI 06/27/2021   Acute renal failure (ARF) ([place]) 06/27/2021   Non-proliferative diabetic retinopathy, mild, both eyes ([place]) 03/03/2018   Vitamin D deficiency 08/21/2016   IBS (irritable bowel syndrome) 08/21/2016   Special screening for malignant neoplasms, colon    Benign neoplasm of transverse colon    Benign neoplasm of sigmoid colon    Uncontrolled type 2 diabetes mellitus with insulin therapy ([place]) 07/12/2015   Benign hypertension with CKD (chronic kidney disease) stage III ([place]) 07/12/2015   Hyperlipidemia associated with type 2 diabetes mellitus ([place]) 07/12/2015   Depression 07/12/2015   BMI 40.0-44.9, adult ([place]) 07/12/2015    Palliative Care Assessment & Plan    Recommendations/Plan: Will continue to follow.    Code Status:    Code Status Orders  (From admission, onward)           Start     Ordered   06/27/21 0445   Full code  Continuous        06/27/21 0445           Code Status History     This patient has a current code status but no historical code status.      Advance Directive Documentation    Flowsheet Row Most Recent Value  Type of Advance Directive Healthcare Power of Attorney  Pre-existing out of facility DNR order (yellow form or pink MOST form) --  "MOST" Form in Place? --       Care plan was discussed with CCM  Thank you for allowing the Palliative Medicine Team to assist in the care of this patient.       Total Time 15 min Prolonged Time Billed  no       Greater than 50%  of this time was spent counseling and coordinating care related to the above assessment and plan.  Asencion Gowda, NP  Please contact Palliative Medicine Team phone at [phone] for questions and concerns.

## 2021-07-07 NOTE — Consult Note (Signed)
ANTICOAGULATION CONSULT NOTE  Pharmacy Consult for Heparin infusion Indication: afib  Patient Measurements: Heparin Dosing Weight: 75.6 kg   Labs: Recent Labs    07/05/21 0443 07/05/21 1244 07/05/21 2015 07/06/21 0440 07/07/21 0545 07/07/21 0550 07/07/21 1945  HGB 6.8*   < > 8.0* 7.5* 8.0*  --   --   HCT 21.8*   < > 25.6* 24.3* 26.7*  --   --   PLT 161  --   --  196 252  --   --   CREATININE 3.79*  --   --  3.62*  --  3.74* 2.37*   < > = values in this interval not displayed.     Estimated Creatinine Clearance: 23.8 mL/min (A) (by C-G formula based on SCr of 2.37 mg/dL (H)).   Medical History: Past Medical History:  Diagnosis Date   Anxiety    Cataract    Depression    Diabetes mellitus without complication ([place])    Gout    History of kidney stones    Hyperlipidemia    Hypertension    Vertigo     Assessment:  [name] admitted 8/2 with acute renal failure, acute respiratory failure secondary to pneumonia. Hospital course complicated by perforated duodenal ulcer and patient was emergently taken to OR 8/5 for ExLap, repair of perforated duodenal ulcer. Patient was previously on heparin drip for PE and was later switched to prophylactic SQH - last dose was 8/12 at 1304. Pharmacy has been consulted for initiation and management of heparin infusion for afib.   Goal of Therapy:  Heparin level 0.3-0.7 units/ml Monitor platelets by anticoagulation protocol: Yes   Plan:  Give heparin bolus of 3800 Start heparin infusion at 1100 units/hr Check HL in 8 hours and daily while on heparin Daily CBC per protocol  Porter Moes O Sylar Voong 07/07/2021,10:08 PM

## 2021-07-07 NOTE — Progress Notes (Signed)
While RN's preparing patient to go to CT scan, RN's cleaned up patient due to patient had a BM.   Patient's heart rate increased in to the 150's and sustained.  MD Patsey Berthold called and came to bedside to assess.  MD advised okay for patient to go to CT scan on night shift after patient's heart rate stabilizes.

## 2021-07-07 NOTE — Progress Notes (Signed)
[facility]  ROUNDING NOTE   Subjective:   Heather Lopez is a 75 y.o. white female who has been admitted for community acquired pneumonia.    Hospital course complicated by duodenal perforation s/p repair on 06/30/2021 ARF - requiring HD PRN Acute resp failure requiring mech ventilation Left obstructive hydronephrosis and renal abscess, Patient underwent cystoscopy and b/l ureteral stent placement 8/10  Patient remains critically ill Intubated, sedated,  Remains vent assisted and getting TPN Fio2 30%  Good UOP but cloudy 8/12- Na elevated today, along with BUN   Objective:  Vital signs in last 24 hours:  Temp:  [97 F (36.1 C)-100 F (37.8 C)] 97.3 F (36.3 C) (08/12 0800) Pulse Rate:  [99-120] 108 (08/12 0800) Resp:  [12-20] 16 (08/12 0800) BP: (138-189)/(52-68) 161/67 (08/12 0800) SpO2:  [91 %-100 %] 100 % (08/12 0800) Arterial Line BP: (120-189)/(40-129) 176/67 (08/12 0800) FiO2 (%):  [45 %] 45 % (08/12 0400) Weight:  [106 kg] 106 kg (08/12 0500)  Weight change: 3.4 kg Filed Weights   06/30/21 0345 07/06/21 0453 07/07/21 0500  Weight: 102.9 kg 102.6 kg 106 kg    Intake/Output: I/O last 3 completed shifts: In: 4013.5 [I.V.:2448.5; NG/GT:1165; IV Piggyback:400] Out: 4 [Urine:5200; Drains:78]   Intake/Output this shift:  Total I/O In: -  Out: 75 [Drains:75]  Physical Exam: General: Critically ill  Head: ETT, OGT  Eyes: Anicteric  Lungs:  Vent assisted  Heart: Regular rate and rhythm  Abdomen:  +JP drains  Extremities:  trace peripheral edema.  Neurologic:  sedated  Skin: No lesions  Access:  RIJ temp cath  Left IJ PermCath-nonfunctional  Basic Metabolic Panel: Recent Labs  Lab 07/02/21 0508 07/03/21 0509 07/04/21 0440 07/05/21 0443 07/06/21 0440 07/07/21 0550  NA 134* 141 145 146* 144 155*  K 4.1 3.8 4.0 3.9 4.7 4.4  CL 102 107 110 113* 114* 124*  CO2 _0 21* 24  GLUCOSE 360* 234* 209* 235* 428* 164*  BUN 91* 98* 93*  104* 111* 138*  CREATININE 4.01* 3.85* 3.67* 3.79* 3.62* 3.74*  CALCIUM 8.3* 8.4* 8.6* 8.7* 8.5* 9.0  MG 2.2 2.0 1.9 1.9 1.9  --   PHOS 5.1* 2.9 3.0 2.9 2.8  --      Liver Function Tests: Recent Labs  Lab 07/01/21 0508 07/03/21 0509 07/06/21 0440  AST 43* 38 42*  ALT 44 34 46*  ALKPHOS 61 77 83  BILITOT 1.1 0.4 0.3  PROT 5.7* 5.7* 5.9*  ALBUMIN 1.9* 1.6* 1.5*    No results for input(s): LIPASE, AMYLASE in the last 168 hours. No results for input(s): AMMONIA in the last 168 hours.  CBC: Recent Labs  Lab 07/01/21 0508 07/02/21 0508 07/03/21 0509 07/04/21 0440 07/05/21 0443 07/05/21 1244 07/05/21 2015 07/06/21 0440 07/07/21 0545  WBC 30.3*   < > 28.9* 24.4* 20.6*  --   --  14.8* 14.2*  NEUTROABS 28.1*  --  26.6*  --  17.3*  --   --  13.5*  --   HGB 7.8*   < > 7.4* 7.3* 6.8* 6.6* 8.0* 7.5* 8.0*  HCT 23.0*   < > 22.3* 22.6* 21.8* 21.7* 25.6* 24.3* 26.7*  MCV 81.0   < > 84.8 87.3 87.6  --   --  91.0 91.4  PLT 231   < > 161 145* 161  --   --  196 252   < > = values in this interval not displayed.     Cardiac  Enzymes: No results for input(s): CKTOTAL, CKMB, CKMBINDEX, TROPONINI in the last 168 hours.  BNP: Invalid input(s): POCBNP  CBG: Recent Labs  Lab 07/07/21 0330 07/07/21 0424 07/07/21 0537 07/07/21 0746 07/07/21 0845  GLUCAP 177* 178* 157* 138* 154*     Microbiology: Results for orders placed or performed during the hospital encounter of 06/27/21  Resp Panel by RT-PCR (Flu A&B, Covid) Nasopharyngeal Swab     Status: None   Collection Time: 06/27/21 12:32 AM   Specimen: Nasopharyngeal Swab; Nasopharyngeal(NP) swabs in vial transport medium  Result Value Ref Range Status   SARS Coronavirus 2 by RT PCR NEGATIVE NEGATIVE Final    Comment: (NOTE) SARS-CoV-2 target nucleic acids are NOT DETECTED.  The SARS-CoV-2 RNA is generally detectable in upper respiratory specimens during the acute phase of infection. The lowest concentration of SARS-CoV-2  viral copies this assay can detect is 138 copies/mL. A negative result does not preclude SARS-Cov-2 infection and should not be used as the sole basis for treatment or other patient management decisions. A negative result may occur with  improper specimen collection/handling, submission of specimen other than nasopharyngeal swab, presence of viral mutation(s) within the areas targeted by this assay, and inadequate number of viral copies(<138 copies/mL). A negative result must be combined with clinical observations, patient history, and epidemiological information. The expected result is Negative.  Fact Sheet for Patients:  EntrepreneurPulse.com.au  Fact Sheet for Healthcare Providers:  IncredibleEmployment.be  This test is no t yet approved or cleared by the [place] FDA and  has been authorized for detection and/or diagnosis of SARS-CoV-2 by FDA under an Emergency Use Authorization (EUA). This EUA will remain  in effect (meaning this test can be used) for the duration of the COVID-19 declaration under Section 564(b)(1) of the Act, 21 U.S.C.section 360bbb-3(b)(1), unless the authorization is terminated  or revoked sooner.       Influenza A by PCR NEGATIVE NEGATIVE Final   Influenza B by PCR NEGATIVE NEGATIVE Final    Comment: (NOTE) The Xpert Xpress SARS-CoV-2/FLU/RSV plus assay is intended as an aid in the diagnosis of influenza from Nasopharyngeal swab specimens and should not be used as a sole basis for treatment. Nasal washings and aspirates are unacceptable for Xpert Xpress SARS-CoV-2/FLU/RSV testing.  Fact Sheet for Patients: EntrepreneurPulse.com.au  Fact Sheet for Healthcare Providers: IncredibleEmployment.be  This test is not yet approved or cleared by the [place] FDA and has been authorized for detection and/or diagnosis of SARS-CoV-2 by FDA under an Emergency Use Authorization  (EUA). This EUA will remain in effect (meaning this test can be used) for the duration of the COVID-19 declaration under Section 564(b)(1) of the Act, 21 U.S.C. section 360bbb-3(b)(1), unless the authorization is terminated or revoked.  Performed at [facility], [address] [place], [place] [address]   Blood culture (routine x 2)     Status: None   Collection Time: 06/27/21 12:43 AM   Specimen: BLOOD  Result Value Ref Range Status   Specimen Description BLOOD RIGHT ASSIST CONTROL  Final   Special Requests   Final    BOTTLES DRAWN AEROBIC AND ANAEROBIC Blood Culture results may not be optimal due to an inadequate volume of blood received in culture bottles   Culture   Final    NO GROWTH 5 DAYS Performed at [facility], [address] [place], [place] [address]    Report Status 07/02/2021 FINAL  Final  Blood culture (routine x 2)  Status: None   Collection Time: 06/27/21  1:45 AM   Specimen: BLOOD  Result Value Ref Range Status   Specimen Description BLOOD RIGHT ASSIST CONTROL  Final   Special Requests   Final    BOTTLES DRAWN AEROBIC AND ANAEROBIC Blood Culture adequate volume   Culture   Final    NO GROWTH 5 DAYS Performed at [facility], [address], [place], [place] [address]    Report Status 07/02/2021 FINAL  Final  Urine Culture     Status: Abnormal   Collection Time: 06/27/21  2:39 AM   Specimen: In/Out Cath Urine  Result Value Ref Range Status   Specimen Description   Final    IN/OUT CATH URINE Performed at [facility], [address] [place], [place] [address]    Special Requests   Final    NONE Performed at [facility], [address] [place], [place] [address]    Culture 30,000 COLONIES/mL YEAST (A)  Final   Report Status 06/28/2021 FINAL  Final  MRSA Next Gen by PCR, Nasal     Status: None   Collection Time: 06/30/21  5:01 AM   Specimen: Nasal Mucosa; Nasal Swab  Result Value Ref Range  Status   MRSA by PCR Next Gen NOT DETECTED NOT DETECTED Final    Comment: (NOTE) The GeneXpert MRSA Assay (FDA approved for NASAL specimens only), is one component of a comprehensive MRSA colonization surveillance program. It is not intended to diagnose MRSA infection nor to guide or monitor treatment for MRSA infections. Test performance is not FDA approved in patients less than 42 years old. Performed at [facility], [address], [place], [place] [address]   Urine Culture     Status: Abnormal   Collection Time: 07/05/21 10:36 AM   Specimen: PATH Other; Urine  Result Value Ref Range Status   Specimen Description   Final    URINE, RANDOM LEFT RENAL PELVIS URINE Performed at [facility], [address], [place], [place] [address]    Special Requests   Final    NONE Performed at [facility], [address] [place], [place] [address]    Culture 70,000 COLONIES/mL YEAST (A)  Final   Report Status 07/06/2021 FINAL  Final  Urine Culture     Status: Abnormal   Collection Time: 07/05/21 10:48 AM   Specimen: PATH Other; Urine  Result Value Ref Range Status   Specimen Description   Final    URINE, RANDOM RIGHT KIDNEY URINE Performed at [facility], [address], [place], [place] [address]    Special Requests   Final    NONE Performed at [facility], [address] [place], [place] [address]    Culture >=100,000 COLONIES/mL YEAST (A)  Final   Report Status 07/06/2021 FINAL  Final    Coagulation Studies: No results for input(s): LABPROT, INR in the last 72 hours.   Urinalysis: No results for input(s): COLORURINE, LABSPEC, PHURINE, GLUCOSEU, HGBUR, BILIRUBINUR, KETONESUR, PROTEINUR, UROBILINOGEN, NITRITE, LEUKOCYTESUR in the last 72 hours.  Invalid input(s): APPERANCEUR     Imaging: No results found.   Medications:    sodium chloride     dexmedetomidine (PRECEDEX) IV infusion Stopped (07/04/21 1050)    fentaNYL infusion INTRAVENOUS 200 mcg/hr (07/07/21 0817)   fluconazole (DIFLUCAN) IV Stopped (07/06/21 1746)   insulin 7 Units/hr (07/07/21 0655)   piperacillin-tazobactam (ZOSYN)  IV Stopped (07/07/21 0606)   TPN ADULT (ION) Stopped (07/06/21  2027)    chlorhexidine gluconate (MEDLINE KIT)  15 mL Mouth Rinse BID   Chlorhexidine Gluconate Cloth  6 each Topical Q0600   feeding supplement (PIVOT 1.5 CAL)  1,000 mL Per Tube Q24H   feeding supplement (PROSource TF)  45 mL Per Tube TID   free water  200 mL Per Tube Q3H   heparin  5,000 Units Subcutaneous Q8H   mouth rinse  15 mL Mouth Rinse 10 times per day   [START ON 07/08/2021] multivitamin  15 mL Per Tube Daily   nutrition supplement (JUVEN)  1 packet Per Tube BID BM   pantoprazole (PROTONIX) IV  40 mg Intravenous Q12H     Assessment/ Plan:  Heather Lopez is a 75 y.o.  female who has been admitted for community acquired pneumonia.    Acute Kidney Injury on chronic kidney disease stage 4 with baseline creatinine 1.97 and GFR of 25 on 12/22/19.  Acute kidney injury secondary to sepsis and obstructive uropathy Chronic kidney disease is secondary to diabetic nephropathy Continue holding losartan and metformin  - S creatinine , BUN trends worse today despite good UOP  -  will plan to dialyze today  Lab Results  Component Value Date   CREATININE 3.74 (H) 07/07/2021   CREATININE 3.62 (H) 07/06/2021   CREATININE 3.79 (H) 07/05/2021    Intake/Output Summary (Last 24 hours) at 07/07/2021 1010 Last data filed at 07/07/2021 0818 Gross per 24 hour  Intake 1859.31 ml  Output 3188 ml  Net -1328.69 ml    2. Acute Respiratory failure secondary to community acquired pneumonia failing outpatient antibiotics.  Requiring mechanical ventilation.   3. Anemia of chronic kidney disease Normocytic Lab Results  Component Value Date   HGB 8.0 (L) 07/07/2021   Low threshold to initiate ESA Dose given 07/01/21  4.  Diabetes mellitus type II with  chronic kidney disease insulin dependent. Most recent hemoglobin A1c is 8.3 on 06/12/21.  Metformin held    5.Bilateral Nephrolithiasis- with obstructive uropathy and left renal abscess Right - Mild to moderate chronic hydronephrosis without obstructing stone Left-mild left hydronephrosis with 14 mm stone at left UPJ- b/l ureteral stent placed 8/10 Lesion at upper pole of left kidney enlarged - likely hematoma or abscess  6. S/p  duodenal perforation s/p repair on 06/30/2021  TPN being weaned off  7. Hypernatremia - free water deficit - increase free water supplement - 200 q 3 hrs - monitor     LOS: 10 Niyati Heinke 8/12/202210:10 AM

## 2021-07-07 NOTE — Progress Notes (Signed)
Pharmacy Antibiotic Note  Heather Lopez is a 75 y.o. female admitted on 06/27/2021 with extensive fungal growth.  Pharmacy has been consulted for fluconazole @ 6mg /kg dosing.  Plan: Patient was treated with Eraxis for suspected candidemia.  Per ID recommendation, therapy to be changed to high dose fluconazole. (Dosing is based on lean body wt).  Height: 5\' 2"  (157.5 cm) Weight: 106 kg (233 lb 11 oz) IBW/kg (Calculated) : 50.1  Temp (24hrs), Avg:96.9 F (36.1 C), Min:95.4 F (35.2 C), Max:99.5 F (37.5 C)  Recent Labs  Lab 07/03/21 0509 07/04/21 0440 07/05/21 0443 07/06/21 0440 07/07/21 0545 07/07/21 0550  WBC 28.9* 24.4* 20.6* 14.8* 14.2*  --   CREATININE 3.85* 3.67* 3.79* 3.62*  --  3.74*    Estimated Creatinine Clearance: 15.1 mL/min (A) (by C-G formula based on SCr of 3.74 mg/dL (H)).    Allergies  Allergen Reactions   Contrast Media  [Iodinated Diagnostic Agents] Anaphylaxis   Iodine Swelling    (IV only) - angioedema    Antimicrobials this admission: Fluconazole 200mg   >> 08/06-08/11 Eraxis 200mg  once >> 08/12 Fluconazole 400mg >>08/12 on going  Dose adjustments this admission: Stop Eraxis Resume fluconazole @6mg /kg (LBW)=400mg  q24hrs pending species ID   Thank you for allowing pharmacy to be a part of this patient's care.  Berta Minor 07/07/2021 7:08 PM

## 2021-07-08 DIAGNOSIS — N179 Acute kidney failure, unspecified: Secondary | ICD-10-CM | POA: Diagnosis not present

## 2021-07-08 DIAGNOSIS — N390 Urinary tract infection, site not specified: Secondary | ICD-10-CM | POA: Diagnosis not present

## 2021-07-08 DIAGNOSIS — J9601 Acute respiratory failure with hypoxia: Secondary | ICD-10-CM | POA: Diagnosis not present

## 2021-07-08 DIAGNOSIS — N170 Acute kidney failure with tubular necrosis: Secondary | ICD-10-CM | POA: Diagnosis not present

## 2021-07-08 LAB — BASIC METABOLIC PANEL
Anion gap: 8 (ref 5–15)
BUN: 98 mg/dL — ABNORMAL HIGH (ref 8–23)
CO2: 27 mmol/L (ref 22–32)
Calcium: 8.1 mg/dL — ABNORMAL LOW (ref 8.9–10.3)
Chloride: 104 mmol/L (ref 98–111)
Creatinine, Ser: 2.87 mg/dL — ABNORMAL HIGH (ref 0.44–1.00)
GFR, Estimated: 17 mL/min — ABNORMAL LOW (ref >60)
Glucose, Bld: 226 mg/dL — ABNORMAL HIGH (ref 70–99)
Potassium: 4 mmol/L (ref 3.5–5.1)
Sodium: 139 mmol/L (ref 135–145)

## 2021-07-08 LAB — GLUCOSE, CAPILLARY
Glucose-Capillary: 107 mg/dL — ABNORMAL HIGH (ref 70–99)
Glucose-Capillary: 156 mg/dL — ABNORMAL HIGH (ref 70–99)
Glucose-Capillary: 170 mg/dL — ABNORMAL HIGH (ref 70–99)
Glucose-Capillary: 174 mg/dL — ABNORMAL HIGH (ref 70–99)
Glucose-Capillary: 203 mg/dL — ABNORMAL HIGH (ref 70–99)
Glucose-Capillary: 206 mg/dL — ABNORMAL HIGH (ref 70–99)
Glucose-Capillary: 211 mg/dL — ABNORMAL HIGH (ref 70–99)
Glucose-Capillary: 244 mg/dL — ABNORMAL HIGH (ref 70–99)
Glucose-Capillary: 244 mg/dL — ABNORMAL HIGH (ref 70–99)
Glucose-Capillary: 269 mg/dL — ABNORMAL HIGH (ref 70–99)
Glucose-Capillary: 320 mg/dL — ABNORMAL HIGH (ref 70–99)
Glucose-Capillary: 359 mg/dL — ABNORMAL HIGH (ref 70–99)
Glucose-Capillary: 397 mg/dL — ABNORMAL HIGH (ref 70–99)
Glucose-Capillary: 404 mg/dL — ABNORMAL HIGH (ref 70–99)
Glucose-Capillary: 422 mg/dL — ABNORMAL HIGH (ref 70–99)

## 2021-07-08 LAB — CBC
HCT: 24.4 % — ABNORMAL LOW (ref 36.0–46.0)
Hemoglobin: 7.6 g/dL — ABNORMAL LOW (ref 12.0–15.0)
MCH: 27.6 pg (ref 26.0–34.0)
MCHC: 31.1 g/dL (ref 30.0–36.0)
MCV: 88.7 fL (ref 80.0–100.0)
Platelets: 210 10*3/uL (ref 150–400)
RBC: 2.75 MIL/uL — ABNORMAL LOW (ref 3.87–5.11)
RDW: 15.8 % — ABNORMAL HIGH (ref 11.5–15.5)
WBC: 12.1 10*3/uL — ABNORMAL HIGH (ref 4.0–10.5)
nRBC: 0.3 % — ABNORMAL HIGH (ref 0.0–0.2)

## 2021-07-08 LAB — MAGNESIUM: Magnesium: 2.2 mg/dL (ref 1.7–2.4)

## 2021-07-08 LAB — PHOSPHORUS: Phosphorus: 3.9 mg/dL (ref 2.5–4.6)

## 2021-07-08 LAB — HEPARIN LEVEL (UNFRACTIONATED)
Heparin Unfractionated: 0.11 IU/mL — ABNORMAL LOW (ref 0.30–0.70)
Heparin Unfractionated: 0.12 IU/mL — ABNORMAL LOW (ref 0.30–0.70)

## 2021-07-08 MED ORDER — INSULIN DETEMIR 100 UNIT/ML ~~LOC~~ SOLN
20.0000 [IU] | Freq: Two times a day (BID) | SUBCUTANEOUS | Status: DC
  Administered 2021-07-08: 20 [IU] via SUBCUTANEOUS
  Filled 2021-07-08 (×3): qty 0.2

## 2021-07-08 MED ORDER — HEPARIN BOLUS VIA INFUSION
2200.0000 [IU] | Freq: Once | INTRAVENOUS | Status: AC
  Administered 2021-07-08: 2200 [IU] via INTRAVENOUS
  Filled 2021-07-08: qty 2200

## 2021-07-08 MED ORDER — INSULIN ASPART 100 UNIT/ML IJ SOLN
15.0000 [IU] | Freq: Once | INTRAMUSCULAR | Status: AC
  Administered 2021-07-08: 15 [IU] via SUBCUTANEOUS
  Filled 2021-07-08: qty 1

## 2021-07-08 MED ORDER — INSULIN ASPART 100 UNIT/ML IJ SOLN
3.0000 [IU] | INTRAMUSCULAR | Status: DC
  Administered 2021-07-08: 9 [IU] via SUBCUTANEOUS

## 2021-07-08 MED ORDER — INSULIN ASPART 100 UNIT/ML IJ SOLN
7.0000 [IU] | INTRAMUSCULAR | Status: DC
  Administered 2021-07-08: 7 [IU] via SUBCUTANEOUS

## 2021-07-08 MED ORDER — INSULIN ASPART 100 UNIT/ML IJ SOLN: 7.0000 [IU] | INTRAMUSCULAR | Status: DC

## 2021-07-08 MED ORDER — INSULIN DETEMIR 100 UNIT/ML ~~LOC~~ SOLN: 25.0000 [IU] | Freq: Two times a day (BID) | SUBCUTANEOUS | Status: DC

## 2021-07-08 MED ORDER — INSULIN DETEMIR 100 UNIT/ML ~~LOC~~ SOLN
20.0000 [IU] | Freq: Two times a day (BID) | SUBCUTANEOUS | Status: DC
  Filled 2021-07-08 (×2): qty 0.2

## 2021-07-08 MED ORDER — INSULIN ASPART 100 UNIT/ML IJ SOLN: 1.0000 [IU] | INTRAMUSCULAR | Status: DC

## 2021-07-08 MED ORDER — DEXTROSE 10 % IV SOLN: INTRAVENOUS | Status: DC | PRN

## 2021-07-08 MED ORDER — INSULIN DETEMIR 100 UNIT/ML ~~LOC~~ SOLN
35.0000 [IU] | Freq: Two times a day (BID) | SUBCUTANEOUS | Status: DC
  Administered 2021-07-08: 35 [IU] via SUBCUTANEOUS
  Filled 2021-07-08 (×2): qty 0.35

## 2021-07-08 MED ORDER — SODIUM CHLORIDE 0.9 % IV SOLN
INTRAVENOUS | Status: DC
  Administered 2021-07-09: 75 mL/h via INTRAVENOUS

## 2021-07-08 MED ORDER — INSULIN ASPART 100 UNIT/ML IJ SOLN
INTRAMUSCULAR | Status: AC
  Filled 2021-07-08: qty 1

## 2021-07-08 MED ORDER — INSULIN REGULAR(HUMAN) IN NACL 100-0.9 UT/100ML-% IV SOLN
INTRAVENOUS | Status: DC
  Administered 2021-07-08: 11.5 [IU]/h via INTRAVENOUS
  Filled 2021-07-08: qty 100

## 2021-07-08 MED ORDER — INSULIN ASPART 100 UNIT/ML IJ SOLN
0.0000 [IU] | INTRAMUSCULAR | Status: DC
  Administered 2021-07-08: 20 [IU] via SUBCUTANEOUS
  Administered 2021-07-09: 11 [IU] via SUBCUTANEOUS
  Administered 2021-07-09: 20 [IU] via SUBCUTANEOUS
  Filled 2021-07-08 (×3): qty 1

## 2021-07-08 MED ORDER — INSULIN ASPART 100 UNIT/ML IJ SOLN
10.0000 [IU] | Freq: Once | INTRAMUSCULAR | Status: AC
  Administered 2021-07-08: 10 [IU] via SUBCUTANEOUS
  Filled 2021-07-08: qty 1

## 2021-07-08 NOTE — Progress Notes (Signed)
NAME:  Heather Lopez, MRN:  [id number], DOB:  22-Jul-1946, LOS: 11 ADMISSION DATE:  06/27/2021, CONSULTATION DATE:  06/30/21 REFERRING MD:  Hampton Abbot, MD  CHIEF COMPLAINT:  Abdominal Pain   HPI  75 y.o with significant PMH as below who presented to the ED on 06/27/21 with chief complaints of progressive shortness of breath, productive cough, loss of appetite, nausea, dry heaving fatigue, malaise and generalized body aches. Patient initially went to her PCP on 7/29 with complaints of rhinorrhea, harsh cough, loss of appetite, nausea, fatigue, malaise and generalized body aches x2 days, patient was diagnosed with pneumonia of which she received ceftriaxone 1 mg IM and sent home with Levaquin, prednisone and Tessalon.  Per ED reports, patient reported progressive worsening of her symptoms during ED visit.   Past Medical History  Diabetes mellitus type 2 Anxiety and depression Hypertension Hyperlipidemia Hydronephrosis and recurrent UTI Gout and osteoarthritis Significant Hospital Events   8/2: Admitted to MedSurg unit with sepsis 8/3: Vascular consulted for HD catheter 8/4: Patient developed acute abdomen.  CT abdomen shows viscus perforation.  Patient taken to the OR urgently for exploratory lap.  Patient return to ICU on vent.  PCCM consulted 07/01/21- patient had SBT today for recruitment and chest physiotherapy.  Still has significant tachycardia and hypertension when awake (possible pain related) but mentation is good able to follow verbal communication.  07/02/21-patient has failed SBT today due to hypoxemia.  Reviewed care plan with surgery team.  07/03/21- Lung V/Q scan obtained ~ negative for PE.  Weaning FiO2, currently at 50% 8/9 severe resp failure, remains on vent 8/10 s/p Cystoscopy with Bilateral ureteral stent placement (77F/24 cm) for LEFT OBSTRUCTED KIDNEY STONE 8/11 remains on VENT 8/11 failed SAT/SBT  8/12 started on insulin drip for FSBS >400, developed A. fib with RVR CT abdomen pelvis  showed decreasing size of left suprarenal collection 8/13 transitioned off insulin, rate relatively controlled  Consults:  Nephrology Vascular PCCM General Surgery Urology  Procedures:  8/5: Exploratory Lap  8/10: Cystoscopy, bilateral ureteral stent placement, bilateral retrograde pyelography   Significant Diagnostic Tests:  8/2: Chest Xray>left basilar consolidation or atelectasis and suspected small left pleural effusion 8/2: Renal ultrasound> moderate right hydronephrosis, mild left hydronephrosis new with bilateral nephrolithiasis 8/4: CTA abdomen and pelvis> Extensive pneumoperitoneum compatible with perforated viscus. Gastric perforation is suspected, though bowel wall defect is not definitively identified. Surgical consultation is recommended. 2. Large ill-defined mass arising from the upper pole left kidney consistent with renal cell carcinoma. Dedicated renal MRI may be useful when clinical situation permits. 3. Acute left-sided hydronephrosis due to a 12 mm left UPJ calculus. 4. Chronic right hydronephrosis and hydroureter without clear etiology. 5. Bilateral nonobstructing renal calculi as above. 6. Small volume ascites. 7. Trace bilateral pleural effusions with patchy bibasilar atelectasis. 8/7: Venous US BLE>> negative for DVT 8/8: Lung V/Q Scan>>Negative for pulmonary embolus.  Micro Data:  8/2: SARS-CoV-2 PCR> negative 8/2: Influenza PCR> negative 8/2: Blood culture x2> no growth 8/2: Urine Culture> 30,000 colonies yeast 8/2: Legionella urinary antigen> negative 8/10: 70,000 colonies yeast, ID pend  Antimicrobials:  8/4 Fluconazole> increased to high dose 8/12 8/4 Zosyn>  OBJECTIVE  Blood pressure (!) 116/55, pulse 77, temperature 98.8 F (37.1 C), resp. rate 20, height '5\' 2"'  (1.575 m), weight 106 kg, SpO2 100 %.    Vent Mode: PRVC FiO2 (%):  [45 %] 45 % Set Rate:  [14 bmp] 14 bmp Vt Set:  [480 mL] 480 mL PEEP:  [5 cmH20]  [place] Pressure:   [16 cmH20] 16 cmH20   Intake/Output Summary (Last 24 hours) at 07/08/2021 1528 Last data filed at 07/08/2021 1300 Gross per 24 hour  Intake 2174.65 ml  Output 910 ml  Net 1264.65 ml    Filed Weights   07/06/21 0453 07/07/21 0500 07/08/21 0500  Weight: 102.6 kg 106 kg 106 kg   Physical Examination  GENERAL: 75 year-old acutely ill patient lying in the bed intubated, mechanically ventilated and sedated, for the most part synchronous with the ventilator  EYES: Pupils equal, round, reactive to light and accommodation. No scleral icterus.  HEENT: Head atraumatic, normocephalic. Orally intubated. NECK:  Supple, no jugular venous distention. No thyroid enlargement, no tenderness.  LUNGS: Mechanical breath sounds bilaterally, no wheezing, rales, fused rhonchi. No use of accessory muscles of respiration. Synchronous with the vent  CARDIOVASCULAR: Tachycardia, irregular rhythm, S1, S2. No murmurs, rubs, or gallops.  ABDOMEN: Distended, Midline incision with dressing dry and intact. JP Drains x 2 EXTREMITIES: No pedal edema, cyanosis, or clubbing.  NEUROLOGIC: Lightly sedated, arouses to voice and follow commands (moves all extremities to command), no focal deficits noted PSYCHIATRIC: The patient is intubated and sedated SKIN: Rash under breast and abdominal folds consistent with yeast. No OBVIOUS lesion   Labs   CBC: Recent Labs  Lab 07/03/21 0509 07/04/21 0440 07/05/21 0443 07/05/21 1244 07/05/21 2015 07/06/21 0440 07/07/21 0545 07/08/21 0440  WBC 28.9* 24.4* 20.6*  --   --  14.8* 14.2* 12.1*  NEUTROABS 26.6*  --  17.3*  --   --  13.5*  --   --   HGB 7.4* 7.3* 6.8* 6.6* 8.0* 7.5* 8.0* 7.6*  HCT 22.3* 22.6* 21.8* 21.7* 25.6* 24.3* 26.7* 24.4*  MCV 84.8 87.3 87.6  --   --  91.0 91.4 88.7  PLT 161 145* 161  --   --  196 252 210     Basic Metabolic Panel: Recent Labs  Lab 07/04/21 0440 07/05/21 0443 07/06/21 0440 07/07/21 0550 07/07/21 1945 07/08/21 0440  NA 145 146* 144  155* 137 139  K 4.0 3.9 4.7 4.4 4.3 4.0  CL 110 113* 114* 124* 102 104  CO2 25 25 21* '24 25 27  ' GLUCOSE 209* 235* 428* 164* 412* 226*  BUN 93* 104* 111* 138* 90* 98*  CREATININE 3.67* 3.79* 3.62* 3.74* 2.37* 2.87*  CALCIUM 8.6* 8.7* 8.5* 9.0 7.8* 8.1*  MG 1.9 1.9 1.9  --  1.8 2.2  PHOS 3.0 2.9 2.8  --  3.0 3.9    GFR: Estimated Creatinine Clearance: 19.7 mL/min (A) (by C-G formula based on SCr of 2.87 mg/dL (H)). Recent Labs  Lab 07/05/21 0443 07/06/21 0440 07/07/21 0545 07/08/21 0440  WBC 20.6* 14.8* 14.2* 12.1*     Liver Function Tests: Recent Labs  Lab 07/03/21 0509 07/06/21 0440  AST 38 42*  ALT 34 46*  ALKPHOS 77 83  BILITOT 0.4 0.3  PROT 5.7* 5.9*  ALBUMIN 1.6* 1.5*    No results for input(s): LIPASE, AMYLASE in the last 168 hours. No results for input(s): AMMONIA in the last 168 hours.  ABG    Component Value Date/Time   PHART 7.25 (L) 07/06/2021 0951   PCO2ART 46 07/06/2021 0951   PO2ART 57 (L) 07/06/2021 0951   HCO3 20.2 07/06/2021 0951   ACIDBASEDEF 6.7 (H) 07/06/2021 0951   O2SAT 83.9 07/06/2021 0951      Coagulation Profile: No results for input(s): INR, PROTIME in the last 168 hours.  Cardiac Enzymes: No results for input(s): CKTOTAL, CKMB, CKMBINDEX, TROPONINI in the last 168 hours.  CBG: Recent Labs  Lab 07/08/21 0633 07/08/21 0757 07/08/21 0904 07/08/21 1120 07/08/21 1253  GLUCAP 211* 170* 174* 156* 107*     Review of Systems:   UNABLE TO OBTAIN DUE TO PATIENT ON THE VENT AND SEDATED   Allergies Allergies  Allergen Reactions   Contrast Media  [Iodinated Diagnostic Agents] Anaphylaxis   Iodine Swelling    (IV only) - angioedema    Scheduled Meds:  chlorhexidine gluconate (MEDLINE KIT)  15 mL Mouth Rinse BID   Chlorhexidine Gluconate Cloth  6 each Topical Q0600   feeding supplement (PIVOT 1.5 CAL)  1,000 mL Per Tube Q24H   feeding supplement (PROSource TF)  45 mL Per Tube TID   free water  100 mL Per Tube Q3H    insulin aspart  1-3 Units Subcutaneous Q4H   insulin aspart  7 Units Subcutaneous Q4H   insulin detemir  20 Units Subcutaneous Q12H   mouth rinse  15 mL Mouth Rinse 10 times per day   metoprolol tartrate  5 mg Intravenous Q6H   multivitamin  15 mL Per Tube Daily   nutrition supplement (JUVEN)  1 packet Per Tube BID BM   pantoprazole (PROTONIX) IV  40 mg Intravenous Q12H   Continuous Infusions:  sodium chloride     sodium chloride 75 mL/hr at 07/08/21 1115   dexmedetomidine (PRECEDEX) IV infusion 0.6 mcg/kg/hr (07/08/21 1401)   dextrose     diltiazem (CARDIZEM) infusion 5 mg/hr (07/08/21 1130)   fentaNYL infusion INTRAVENOUS 100 mcg/hr (07/08/21 1503)   fluconazole (DIFLUCAN) IV Stopped (07/07/21 2223)   heparin 1,200 Units/hr (07/08/21 1120)   insulin 4 Units/hr (07/08/21 0800)   piperacillin-tazobactam (ZOSYN)  IV Stopped (07/08/21 0559)   PRN Meds:.sodium chloride, acetaminophen, alteplase, dextrose, fentaNYL, heparin, lidocaine (PF), lidocaine-prilocaine, midazolam, ondansetron (ZOFRAN) IV, pentafluoroprop-tetrafluoroeth  Active Hospital Problem list   Acute hypoxic respiratory failure ventilator dependent Severe sepsis with septic shock Bowel perforation AKI on CKD stage IV Right UPJ stone with hydronephrosis Anion gap metabolic acidosis Lactic acidosis Diabetes mellitus  Assessment & Plan:  Acute Hypoxic Respiratory Failure in setting of Community Acquired Pneumonia,  bilateral pleural effusions with associated atelectasis -Full vent support, implement lung protective strategies -Increase PEEP to 8 -Wean FiO2 & PEEP as tolerated to maintain O2 sats >92% -Follow intermittent Chest X-ray & ABG as needed -Spontaneous Breathing Trials when respiratory parameters met and mental status permits -Patient does not meet SBT criteria today due to cardiac instability A. fib RVR -Implement VAP Bundle -PRN Bronchodilators -Continue Zosyn (previously on Ceftriaxone and  Azithromycin)  Severe Sepsis secondary to UTI, Community Acquired Pneumonia and perforated Duodenal Ulcer with peritonitis Status post laparotomy Status post ureteral stent placement Left renal abscess, appears to be resolving Candiduria -Monitor fever curve -Trend WBC's & Procalcitonin -Follow cultures as above -Continue Zosyn & Fluconazole pending cultures & sensitivities -General Surgery following, appreciate input, will follow recommendations -Keep NPO x5 days post procedure -NGT to LIS -S/p exploratory laparotomy and primary repair of duodenal ulcer with omental patch on 06/29/21 -S/P bilateral ureteral stent placement 8/10  Atrial fibrillation with RVR Continue Cardizem infusion Continue metoprolol IV Avoiding amiodarone due to need for high dose fluconazole Corrected magnesium Treat anxiety/ventilator associated discomfort Supportive care Echo has shown increased LA size   Septic Shock>>resolved Hypertension currently normotensive -Continuous cardiac monitoring -Maintain MAP >65 -IV fluids per nephrology   Acute kidney injury on CKD  stage IV (baseline creatinine 1.97 and GFR of 25 on 12/22/19.) Right UPJ stone with hydronephrosis Suspected renal cell carcinoma, will need further eval/biopsy -Monitor I&O's / urinary output -Follow BMP -Ensure adequate renal perfusion -Avoid nephrotoxic agents as able -Replace electrolytes as indicated -Nephrology following, appreciate input -Followed by Urology outpatient with urethral stent placement in past  Anemia without overt s/sx of bleeding -Monitor for S/Sx of bleeding -Trend CBC -Resumed heparin infusion due to A. fib RVR -Transfuse for Hgb <7  Diabetes mellitus -CBGs -Sliding scale insulin -Follow ICU hyper/hypoglycemia protocol -Hold home Meds   Pt is critically ill with multiorgan failure.  Prognosis is extremely guarded.  High risk for cardiac arrest and death.  Best practice:  Diet:  NPO,  TPN Pain/Anxiety/Delirium protocol (if indicated): Yes (RASS goal 0) VAP protocol (if indicated): Yes DVT prophylaxis: Heparin gtt GI prophylaxis: PPI Glucose control:  SSI Yes Central venous access:  Yes, and it is still needed Arterial line:  Yes, and it is still needed Foley:  Yes, and it is still needed Mobility:  bed rest  PT consulted: N/A Last date of multidisciplinary goals of care discussion [8/8] Code Status:  full code Disposition: ICU  Updated pt's spouse Lupie Sawa at bedside 07/08/21.  All questions answered.  The patient is critically ill with multiple organ systems failure and requires high complexity decision making for assessment and support, frequent evaluation and titration of therapies, application of advanced monitoring technologies and extensive interpretation of multiple databases. Critical Care Time devoted to patient care services described in this note is  45 minutes.   Renold Don, MD Advanced Bronchoscopy PCCM [facility]    *This note was dictated using voice recognition software/Dragon.  Despite best efforts to proofread, errors can occur which can change the meaning.  Any change was purely unintentional. .

## 2021-07-08 NOTE — Progress Notes (Signed)
[facility]  ROUNDING NOTE   Subjective:   Heather Lopez is a 75 y.o. white female who has been admitted for community acquired pneumonia.    Hospital course complicated by duodenal perforation s/p repair on 06/30/2021 ARF - requiring HD PRN Acute resp failure requiring mech ventilation Left obstructive hydronephrosis and renal abscess, Patient underwent cystoscopy and b/l ureteral stent placement 8/10  Patient seen today in ICU Patient remains critically ill patient remains intubated and sedated Patient is unable to offer any complaints   Objective:  Vital signs in last 24 hours:  Temp:  [97.2 F (36.2 C)-99 F (37.2 C)] 98.8 F (37.1 C) (08/13 1200) Pulse Rate:  [49-102] 77 (08/13 1200) Resp:  [14-21] 20 (08/13 1200) BP: (98-126)/(44-65) 116/55 (08/13 1200) SpO2:  [99 %-100 %] 100 % (08/13 1200) Arterial Line BP: (91-163)/(32-72) 117/41 (08/13 1200) FiO2 (%):  [35 %-45 %] 35 % (08/13 1455) Weight:  [106 kg] 106 kg (08/13 0500)  Weight change: 0 kg Filed Weights   07/06/21 0453 07/07/21 0500 07/08/21 0500  Weight: 102.6 kg 106 kg 106 kg    Intake/Output: I/O last 3 completed shifts: In: 4218.1 [I.V.:1359.8; NG/GT:2130.7; IV Piggyback:727.7] Out: 3685 [Urine:3500; Drains:185]   Intake/Output this shift:  Total I/O In: -  Out: 250 [Urine:250]  Physical Exam: General: Critically ill  Head: ETT, OGT  Eyes: Anicteric  Lungs:  Vent assisted  Heart: Regular rate and rhythm  Abdomen:  +JP drains  Extremities:  trace peripheral edema.  Neurologic:  sedated  Skin: No lesions  Access:  RIJ temp cath  Left IJ PermCath-nonfunctional  Basic Metabolic Panel: Recent Labs  Lab 07/04/21 0440 07/05/21 0443 07/06/21 0440 07/07/21 0550 07/07/21 1945 07/08/21 0440  NA 145 146* 144 155* 137 139  K 4.0 3.9 4.7 4.4 4.3 4.0  CL 110 113* 114* 124* 102 104  CO2 25 25 21* _0 GLUCOSE 209* 235* 428* 164* 412* 226*  BUN 93* 104* 111* 138* 90* 98*  CREATININE  3.67* 3.79* 3.62* 3.74* 2.37* 2.87*  CALCIUM 8.6* 8.7* 8.5* 9.0 7.8* 8.1*  MG 1.9 1.9 1.9  --  1.8 2.2  PHOS 3.0 2.9 2.8  --  3.0 3.9    Liver Function Tests: Recent Labs  Lab 07/03/21 0509 07/06/21 0440  AST 38 42*  ALT 34 46*  ALKPHOS 77 83  BILITOT 0.4 0.3  PROT 5.7* 5.9*  ALBUMIN 1.6* 1.5*   No results for input(s): LIPASE, AMYLASE in the last 168 hours. No results for input(s): AMMONIA in the last 168 hours.  CBC: Recent Labs  Lab 07/03/21 0509 07/04/21 0440 07/05/21 0443 07/05/21 1244 07/05/21 2015 07/06/21 0440 07/07/21 0545 07/08/21 0440  WBC 28.9* 24.4* 20.6*  --   --  14.8* 14.2* 12.1*  NEUTROABS 26.6*  --  17.3*  --   --  13.5*  --   --   HGB 7.4* 7.3* 6.8* 6.6* 8.0* 7.5* 8.0* 7.6*  HCT 22.3* 22.6* 21.8* 21.7* 25.6* 24.3* 26.7* 24.4*  MCV 84.8 87.3 87.6  --   --  91.0 91.4 88.7  PLT 161 145* 161  --   --  196 252 210    Cardiac Enzymes: No results for input(s): CKTOTAL, CKMB, CKMBINDEX, TROPONINI in the last 168 hours.  BNP: Invalid input(s): POCBNP  CBG: Recent Labs  Lab 07/08/21 0633 07/08/21 0757 07/08/21 0904 07/08/21 1120 07/08/21 1253  GLUCAP 211* 170* 174* 156* 107*    Microbiology: Results for orders placed  or performed during the hospital encounter of 06/27/21  Resp Panel by RT-PCR (Flu A&B, Covid) Nasopharyngeal Swab     Status: None   Collection Time: 06/27/21 12:32 AM   Specimen: Nasopharyngeal Swab; Nasopharyngeal(NP) swabs in vial transport medium  Result Value Ref Range Status   SARS Coronavirus 2 by RT PCR NEGATIVE NEGATIVE Final    Comment: (NOTE) SARS-CoV-2 target nucleic acids are NOT DETECTED.  The SARS-CoV-2 RNA is generally detectable in upper respiratory specimens during the acute phase of infection. The lowest concentration of SARS-CoV-2 viral copies this assay can detect is 138 copies/mL. A negative result does not preclude SARS-Cov-2 infection and should not be used as the sole basis for treatment or other  patient management decisions. A negative result may occur with  improper specimen collection/handling, submission of specimen other than nasopharyngeal swab, presence of viral mutation(s) within the areas targeted by this assay, and inadequate number of viral copies(<138 copies/mL). A negative result must be combined with clinical observations, patient history, and epidemiological information. The expected result is Negative.  Fact Sheet for Patients:  BloggerCourse.com  Fact Sheet for Healthcare Providers:  SeriousBroker.it  This test is no t yet approved or cleared by the [place] FDA and  has been authorized for detection and/or diagnosis of SARS-CoV-2 by FDA under an Emergency Use Authorization (EUA). This EUA will remain  in effect (meaning this test can be used) for the duration of the COVID-19 declaration under Section 564(b)(1) of the Act, 21 U.S.C.section 360bbb-3(b)(1), unless the authorization is terminated  or revoked sooner.       Influenza A by PCR NEGATIVE NEGATIVE Final   Influenza B by PCR NEGATIVE NEGATIVE Final    Comment: (NOTE) The Xpert Xpress SARS-CoV-2/FLU/RSV plus assay is intended as an aid in the diagnosis of influenza from Nasopharyngeal swab specimens and should not be used as a sole basis for treatment. Nasal washings and aspirates are unacceptable for Xpert Xpress SARS-CoV-2/FLU/RSV testing.  Fact Sheet for Patients: BloggerCourse.com  Fact Sheet for Healthcare Providers: SeriousBroker.it  This test is not yet approved or cleared by the [place] FDA and has been authorized for detection and/or diagnosis of SARS-CoV-2 by FDA under an Emergency Use Authorization (EUA). This EUA will remain in effect (meaning this test can be used) for the duration of the COVID-19 declaration under Section 564(b)(1) of the Act, 21 U.S.C. section  360bbb-3(b)(1), unless the authorization is terminated or revoked.  Performed at [facility], [address] [place], [place] [address]   Blood culture (routine x 2)     Status: None   Collection Time: 06/27/21 12:43 AM   Specimen: BLOOD  Result Value Ref Range Status   Specimen Description BLOOD RIGHT ASSIST CONTROL  Final   Special Requests   Final    BOTTLES DRAWN AEROBIC AND ANAEROBIC Blood Culture results may not be optimal due to an inadequate volume of blood received in culture bottles   Culture   Final    NO GROWTH 5 DAYS Performed at [facility], [address], [place], [place] [address]    Report Status 07/02/2021 FINAL  Final  Blood culture (routine x 2)     Status: None   Collection Time: 06/27/21  1:45 AM   Specimen: BLOOD  Result Value Ref Range Status   Specimen Description BLOOD RIGHT ASSIST CONTROL  Final   Special Requests   Final    BOTTLES DRAWN AEROBIC AND ANAEROBIC Blood Culture adequate volume  Culture   Final    NO GROWTH 5 DAYS Performed at [facility], [address] [place], [place] [address]    Report Status 07/02/2021 FINAL  Final  Urine Culture     Status: Abnormal   Collection Time: 06/27/21  2:39 AM   Specimen: In/Out Cath Urine  Result Value Ref Range Status   Specimen Description   Final    IN/OUT CATH URINE Performed at [facility], [address], [place], [place] [address]    Special Requests   Final    NONE Performed at [facility], [place], [place] [address]    Culture 30,000 COLONIES/mL YEAST (A)  Final   Report Status 06/28/2021 FINAL  Final  MRSA Next Gen by PCR, Nasal     Status: None   Collection Time: 06/30/21  5:01 AM   Specimen: Nasal Mucosa; Nasal Swab  Result Value Ref Range Status   MRSA by PCR Next Gen NOT DETECTED NOT DETECTED Final    Comment: (NOTE) The GeneXpert MRSA Assay (FDA approved for NASAL specimens only), is one component of a  comprehensive MRSA colonization surveillance program. It is not intended to diagnose MRSA infection nor to guide or monitor treatment for MRSA infections. Test performance is not FDA approved in patients less than 61 years old. Performed at [facility], [address], [place], [place] [address]   Urine Culture     Status: Abnormal   Collection Time: 07/05/21 10:36 AM   Specimen: PATH Other; Urine  Result Value Ref Range Status   Specimen Description   Final    URINE, RANDOM LEFT RENAL PELVIS URINE Performed at [facility], [address] [place], [place] [address]    Special Requests   Final    NONE Performed at [facility], [address] [place], [place] [address]    Culture 70,000 COLONIES/mL YEAST (A)  Final   Report Status 07/06/2021 FINAL  Final  Urine Culture     Status: Abnormal   Collection Time: 07/05/21 10:48 AM   Specimen: PATH Other; Urine  Result Value Ref Range Status   Specimen Description   Final    URINE, RANDOM RIGHT KIDNEY URINE Performed at [facility], [address] [place], [place] [address]    Special Requests   Final    NONE Performed at [facility], [address] [place],  [address]    Culture >=100,000 COLONIES/mL YEAST (A)  Final   Report Status 07/06/2021 FINAL  Final    Coagulation Studies: No results for input(s): LABPROT, INR in the last 72 hours.   Urinalysis: No results for input(s): COLORURINE, LABSPEC, PHURINE, GLUCOSEU, HGBUR, BILIRUBINUR, KETONESUR, PROTEINUR, UROBILINOGEN, NITRITE, LEUKOCYTESUR in the last 72 hours.  Invalid input(s): APPERANCEUR     Imaging: CT ABDOMEN PELVIS WO CONTRAST  Result Date: 07/07/2021 CLINICAL DATA:  Retroperitoneal abscess. EXAM: CT ABDOMEN AND PELVIS WITHOUT CONTRAST TECHNIQUE: Multidetector CT imaging of the abdomen and pelvis was performed following the standard protocol without IV contrast. COMPARISON:  Most recent CT 3 days  ago 07/04/2021 FINDINGS: Lower chest: Left greater than right basilar airspace opacity, greater than expected for atelectasis on the left. There are small bilateral pleural effusions. Enteric tube is in place. Central catheter is in the right atrium. Mild cardiomegaly. Hepatobiliary: No focal hepatic lesion. Question of subtle capsular nodularity. No intrahepatic collection. Gallbladder appears distended, no pericholecystic inflammation. No common bile duct dilatation. A drain enters  from the right lateral abdomen courses under the left hepatic lobe in terminates in the left anterior abdomen. No fluid collection or free fluid along the course of the drain. Pancreas: Left retroperitoneal inflammation appears centered on the kidney rather than the tail the pancreas. No definite peripancreatic inflammation. No pancreatic ductal dilatation. Spleen: Normal in size without focal abnormality. Adrenals/Urinary Tract: No adrenal nodule. Right-sided nephroureteral stent in place with proximal pigtail at the ureteropelvic junction and distal pigtail in the bladder. Slight prominence of the major calices without renal pelvis dilatation. Nonobstructing right renal calculi, largest in the lower pole measures 10 mm. No stone or stone fragments along the course of the ureteral stent. There is mild right perinephric edema which is improved from prior exam. No definite right intrarenal collection. Left nephroureteral stent in place with proximal pigtail in the renal pelvis and distal pigtail in the bladder. Multiple left intrarenal calculi, largest in the upper pole measuring 10 mm. There is a questionable 5 mm calcification adjacent to the proximal ureteral stent at the level of L3 irregular fluid collection arising from the upper left kidney is incompletely characterized on this noncontrast exam, however appears diminished in size from prior, currently 4.8 x 3.6 cm, measured on series 2, image 29, previously 7.0 x 4.2 cm. Adjacent  retroperitoneal and perinephric fat stranding with mild improvement. The urinary bladder is completely decompressed by Foley catheter. Stomach/Bowel: Enteric tube tip in the stomach which is decompressed. There is no small bowel obstruction or inflammation. Enteric contrast is seen in the colon. Normal appendix tentatively visualized. Contrast filled colon without colonic wall thickening or inflammation. Minimal colonic diverticulosis without diverticulitis. Vascular/Lymphatic: Aortic atherosclerosis. No aortic aneurysm. There multiple prominent retroperitoneal and central mesenteric nodes are likely reactive. Reproductive: Occasional uterine calcifications, possible fibroids. No adnexal mass. Other: Right-sided abdominal drain as described above. There is a drain in the midline of the subcutaneous tissues of the anterior abdomen. Right lower quadrant drain courses into the pelvis with tip terminating on the left. No fluid or fluid collection along the course of the drain. Edema and subcutaneous gas involving the anterior abdominal wall without focal subcutaneous collection. Slightly more prominent subcutaneous edema involving the flank soft tissues. Midline laparotomy staples in place. No free intra-abdominal air. Musculoskeletal: Bony under mineralization with diffuse lumbar degenerative change. Advanced right hip arthropathy. Moderate to advanced left hip arthropathy. No evidence of intramuscular collection. IMPRESSION: 1. Decreased size of irregular fluid collection arising from the upper left kidney, currently 4.8 x 3.6 cm, previously 7.0 x 4.2 cm. Adjacent retroperitoneal and perinephric fat stranding with mild improvement. 2. Bilateral nephroureteral stents in place improvement and hydronephrosis on prior exam. There is a questionable 5 mm calcification adjacent to the proximal left ureteral stent at the level of L3. 3. Bilateral nonobstructing renal calculi. 4. Surgical drains in place without evidence of  fluid collection adjacent to the drainage catheters. 5. Edema and subcutaneous gas involving the anterior abdominal wall without focal subcutaneous collection. 6. Small bilateral pleural effusions. Left greater than right basilar airspace opacity, greater than expected for atelectasis on the left. This may represent pneumonia or aspiration. 7. Question of subtle capsular nodularity of the liver, can be seen with cirrhosis. Recommend correlation with cirrhosis risk factors. Aortic Atherosclerosis (ICD10-I70.0). Electronically Signed   By: Keith Rake M.D.   On: 07/07/2021 23:05     Medications:    sodium chloride     sodium chloride 75 mL/hr at 07/08/21 1115   dexmedetomidine (PRECEDEX) IV  infusion 0.6 mcg/kg/hr (07/08/21 1401)   dextrose     diltiazem (CARDIZEM) infusion 5 mg/hr (07/08/21 1130)   fentaNYL infusion INTRAVENOUS 100 mcg/hr (07/08/21 1503)   fluconazole (DIFLUCAN) IV Stopped (07/07/21 2223)   heparin 1,200 Units/hr (07/08/21 1120)   insulin 4 Units/hr (07/08/21 0800)   piperacillin-tazobactam (ZOSYN)  IV Stopped (07/08/21 0559)    chlorhexidine gluconate (MEDLINE KIT)  15 mL Mouth Rinse BID   Chlorhexidine Gluconate Cloth  6 each Topical Q0600   feeding supplement (PIVOT 1.5 CAL)  1,000 mL Per Tube Q24H   feeding supplement (PROSource TF)  45 mL Per Tube TID   free water  100 mL Per Tube Q3H   insulin aspart  1-3 Units Subcutaneous Q4H   insulin aspart  7 Units Subcutaneous Q4H   insulin detemir  20 Units Subcutaneous Q12H   mouth rinse  15 mL Mouth Rinse 10 times per day   metoprolol tartrate  5 mg Intravenous Q6H   multivitamin  15 mL Per Tube Daily   nutrition supplement (JUVEN)  1 packet Per Tube BID BM   pantoprazole (PROTONIX) IV  40 mg Intravenous Q12H     Assessment/ Plan:  [name] is a 75 y.o.  female who has been admitted for community acquired pneumonia.    Acute Kidney Injury on chronic kidney disease stage 4 with baseline creatinine 1.97 and  GFR of 25 on 12/22/19.  Acute kidney injury secondary to sepsis and obstructive uropathy Chronic kidney disease is secondary to diabetic nephropathy We will continue to hold losartan and metformin Patient creatinine is worsening Patient urine output is lower than yesterday Patient was last dialyzed yesterday No need for renal replacement therapy today   Lab Results  Component Value Date   CREATININE 2.87 (H) 07/08/2021   CREATININE 2.37 (H) 07/07/2021   CREATININE 3.74 (H) 07/07/2021    Intake/Output Summary (Last 24 hours) at 07/08/2021 1543 Last data filed at 07/08/2021 1300 Gross per 24 hour  Intake 2034.65 ml  Output 910 ml  Net 1124.65 ml   2. Acute Respiratory failure secondary to community acquired pneumonia failing outpatient antibiotics.  Requiring mechanical ventilation.   3. Anemia of chronic kidney disease Normocytic Lab Results  Component Value Date   HGB 7.6 (L) 07/08/2021   Low threshold to initiate ESA Dose given 07/01/21  4.  Diabetes mellitus type II with chronic kidney disease insulin dependent. Most recent hemoglobin A1c is 8.3 on 06/12/21.  Metformin held    5.Bilateral Nephrolithiasis- with obstructive uropathy and left renal abscess Right - Mild to moderate chronic hydronephrosis without obstructing stone Left-mild left hydronephrosis with 14 mm stone at left UPJ- b/l ureteral stent placed 8/10 Lesion at upper pole of left kidney enlarged - likely hematoma or abscess  6. S/p  duodenal perforation s/p repair on 06/30/2021  TPN being weaned off  7. Hypernatremia - free water deficit - increase free water supplement - 200 q 3 hrs Sodium is now better   Plan Patient CT scan does not show any fluid overload Patient to ER for reviewed did not show any issues with a EF Patient urine output is less than yesterday We will give patient IV fluids and effort to help with the patient's fluid situation and to see if that helps with urine output No need for  renal replacement therapy today      LOS: 11 Avin Upperman s [facility] 8/13/20223:43 PM

## 2021-07-08 NOTE — Consult Note (Addendum)
ANTICOAGULATION CONSULT NOTE  Pharmacy Consult for Heparin infusion Indication: afib  Patient Measurements: Heparin Dosing Weight: 75.6 kg   Labs: Recent Labs    07/06/21 0440 07/07/21 0545 07/07/21 0550 07/07/21 1945 07/08/21 0440 07/08/21 0947 07/08/21 1808  HGB 7.5* 8.0*  --   --  7.6*  --   --   HCT 24.3* 26.7*  --   --  24.4*  --   --   PLT 196 252  --   --  210  --   --   HEPARINUNFRC  --   --   --   --   --  0.11* 0.12*  CREATININE 3.62*  --  3.74* 2.37* 2.87*  --   --      Estimated Creatinine Clearance: 19.7 mL/min (A) (by C-G formula based on SCr of 2.87 mg/dL (H)).   Medical History: Past Medical History:  Diagnosis Date   Anxiety    Cataract    Depression    Diabetes mellitus without complication ([place])    Gout    History of kidney stones    Hyperlipidemia    Hypertension    Vertigo     Assessment:  [name] admitted 8/2 with acute renal failure, acute respiratory failure secondary to pneumonia. Hospital course complicated by perforated duodenal ulcer and patient was emergently taken to OR 8/5 for ExLap, repair of perforated duodenal ulcer. Patient was previously on heparin drip for PE and was later switched to prophylactic SQH - last dose was 8/12 at 1304. Pharmacy has been consulted for initiation and management of heparin infusion for afib.  8/13 0947 HL 0.11, subtherapeutic 8/13 1808 HL 0.12, subtherapeutic; drawn ~1 hour early; confirmed with nurse no issues with heparin infusion/line   Goal of Therapy:  Heparin level 0.3-0.7 units/ml Monitor platelets by anticoagulation protocol: Yes   Plan:  Heparin level is subtherapeutic. Will give heparin bolus 2200 units x 1 and increase heparin infusion to 1450 units/hr. Recheck heparin level in 8 hours after rate change. CBC daily while on heparin.   Sherilyn Banker, PharmD 07/08/2021,7:08 PM

## 2021-07-08 NOTE — Consult Note (Signed)
ANTICOAGULATION CONSULT NOTE  Pharmacy Consult for Heparin infusion Indication: afib  Patient Measurements: Heparin Dosing Weight: 75.6 kg   Labs: Recent Labs    07/06/21 0440 07/07/21 0545 07/07/21 0550 07/07/21 1945 07/08/21 0440 07/08/21 0947  HGB 7.5* 8.0*  --   --  7.6*  --   HCT 24.3* 26.7*  --   --  24.4*  --   PLT 196 252  --   --  210  --   HEPARINUNFRC  --   --   --   --   --  0.11*  CREATININE 3.62*  --  3.74* 2.37* 2.87*  --      Estimated Creatinine Clearance: 19.7 mL/min (A) (by C-G formula based on SCr of 2.87 mg/dL (H)).   Medical History: Past Medical History:  Diagnosis Date   Anxiety    Cataract    Depression    Diabetes mellitus without complication ([place])    Gout    History of kidney stones    Hyperlipidemia    Hypertension    Vertigo     Assessment:  [name] admitted 8/2 with acute renal failure, acute respiratory failure secondary to pneumonia. Hospital course complicated by perforated duodenal ulcer and patient was emergently taken to OR 8/5 for ExLap, repair of perforated duodenal ulcer. Patient was previously on heparin drip for PE and was later switched to prophylactic SQH - last dose was 8/12 at 1304. Pharmacy has been consulted for initiation and management of heparin infusion for afib.  8/13 0947 HL 0.11.    Goal of Therapy:  Heparin level 0.3-0.7 units/ml Monitor platelets by anticoagulation protocol: Yes   Plan:  Heparin level is subtherapeutic. Will give heparin bolus 2200 units x 1 and increase heparin infusion to 1200 units/hr. Pt was started on heparin on 8/8 and the heparin level was therapeutic at a rate of 1200 unit/hr. Recheck heparin level in 8 hours. CBC daily while on heparin.   Heather Lopez, PharmD, BCPS 07/08/2021,10:35 AM

## 2021-07-08 NOTE — Progress Notes (Signed)
CC: perf ulcer Subjective: POD # 8 perf duodenal ulcer s/p graham patch Critically ill requiring HD A fib  Started anticoagulation CT yest pers reviewed no abscess or contrast extravasation, drains in place, no free air Palliative on board Drains 185cc serous Still acidotic   Objective: Vital signs in last 24 hours: Temp:  [95.4 F (35.2 C)-99 F (37.2 C)] 99 F (37.2 C) (08/13 1015) Pulse Rate:  [26-103] 61 (08/13 1015) Resp:  [13-21] 21 (08/13 1015) BP: (98-191)/(44-99) 99/50 (08/13 1000) SpO2:  [98 %-100 %] 99 % (08/13 1015) Arterial Line BP: (99-185)/(32-95) 99/37 (08/13 0900) FiO2 (%):  [45 %] 45 % (08/13 0750) Weight:  [106 kg] 106 kg (08/13 0500) Last BM Date: 07/07/21  Intake/Output from previous day: 08/12 0701 - 08/13 0700 In: 3615.3 [I.V.:806.9; NG/GT:2130.7; IV Piggyback:677.7] Out: 1685 [Urine:1500; Drains:185] Intake/Output this shift: No intake/output data recorded.  Physical exam:  Critically ill, sedated and on the ventr Abd: incision w staples and penrose. Drains serous, no peritonitis or infection  Lab Results: CBC  Recent Labs    07/07/21 0545 07/08/21 0440  WBC 14.2* 12.1*  HGB 8.0* 7.6*  HCT 26.7* 24.4*  PLT 252 210   BMET Recent Labs    07/07/21 1945 07/08/21 0440  NA 137 139  K 4.3 4.0  CL 102 104  CO2 25 27  GLUCOSE 412* 226*  BUN 90* 98*  CREATININE 2.37* 2.87*  CALCIUM 7.8* 8.1*   PT/INR No results for input(s): LABPROT, INR in the last 72 hours. ABG Recent Labs    07/06/21 0951  PHART 7.25*  HCO3 20.2    Studies/Results: CT ABDOMEN PELVIS WO CONTRAST  Result Date: 07/07/2021 CLINICAL DATA:  Retroperitoneal abscess. EXAM: CT ABDOMEN AND PELVIS WITHOUT CONTRAST TECHNIQUE: Multidetector CT imaging of the abdomen and pelvis was performed following the standard protocol without IV contrast. COMPARISON:  Most recent CT 3 days ago 07/04/2021 FINDINGS: Lower chest: Left greater than right basilar airspace opacity, greater  than expected for atelectasis on the left. There are small bilateral pleural effusions. Enteric tube is in place. Central catheter is in the right atrium. Mild cardiomegaly. Hepatobiliary: No focal hepatic lesion. Question of subtle capsular nodularity. No intrahepatic collection. Gallbladder appears distended, no pericholecystic inflammation. No common bile duct dilatation. A drain enters from the right lateral abdomen courses under the left hepatic lobe in terminates in the left anterior abdomen. No fluid collection or free fluid along the course of the drain. Pancreas: Left retroperitoneal inflammation appears centered on the kidney rather than the tail the pancreas. No definite peripancreatic inflammation. No pancreatic ductal dilatation. Spleen: Normal in size without focal abnormality. Adrenals/Urinary Tract: No adrenal nodule. Right-sided nephroureteral stent in place with proximal pigtail at the ureteropelvic junction and distal pigtail in the bladder. Slight prominence of the major calices without renal pelvis dilatation. Nonobstructing right renal calculi, largest in the lower pole measures 10 mm. No stone or stone fragments along the course of the ureteral stent. There is mild right perinephric edema which is improved from prior exam. No definite right intrarenal collection. Left nephroureteral stent in place with proximal pigtail in the renal pelvis and distal pigtail in the bladder. Multiple left intrarenal calculi, largest in the upper pole measuring 10 mm. There is a questionable 5 mm calcification adjacent to the proximal ureteral stent at the level of L3 irregular fluid collection arising from the upper left kidney is incompletely characterized on this noncontrast exam, however appears diminished in size from prior, currently  4.8 x 3.6 cm, measured on series 2, image 29, previously 7.0 x 4.2 cm. Adjacent retroperitoneal and perinephric fat stranding with mild improvement. The urinary bladder is  completely decompressed by Foley catheter. Stomach/Bowel: Enteric tube tip in the stomach which is decompressed. There is no small bowel obstruction or inflammation. Enteric contrast is seen in the colon. Normal appendix tentatively visualized. Contrast filled colon without colonic wall thickening or inflammation. Minimal colonic diverticulosis without diverticulitis. Vascular/Lymphatic: Aortic atherosclerosis. No aortic aneurysm. There multiple prominent retroperitoneal and central mesenteric nodes are likely reactive. Reproductive: Occasional uterine calcifications, possible fibroids. No adnexal mass. Other: Right-sided abdominal drain as described above. There is a drain in the midline of the subcutaneous tissues of the anterior abdomen. Right lower quadrant drain courses into the pelvis with tip terminating on the left. No fluid or fluid collection along the course of the drain. Edema and subcutaneous gas involving the anterior abdominal wall without focal subcutaneous collection. Slightly more prominent subcutaneous edema involving the flank soft tissues. Midline laparotomy staples in place. No free intra-abdominal air. Musculoskeletal: Bony under mineralization with diffuse lumbar degenerative change. Advanced right hip arthropathy. Moderate to advanced left hip arthropathy. No evidence of intramuscular collection. IMPRESSION: 1. Decreased size of irregular fluid collection arising from the upper left kidney, currently 4.8 x 3.6 cm, previously 7.0 x 4.2 cm. Adjacent retroperitoneal and perinephric fat stranding with mild improvement. 2. Bilateral nephroureteral stents in place improvement and hydronephrosis on prior exam. There is a questionable 5 mm calcification adjacent to the proximal left ureteral stent at the level of L3. 3. Bilateral nonobstructing renal calculi. 4. Surgical drains in place without evidence of fluid collection adjacent to the drainage catheters. 5. Edema and subcutaneous gas involving  the anterior abdominal wall without focal subcutaneous collection. 6. Small bilateral pleural effusions. Left greater than right basilar airspace opacity, greater than expected for atelectasis on the left. This may represent pneumonia or aspiration. 7. Question of subtle capsular nodularity of the liver, can be seen with cirrhosis. Recommend correlation with cirrhosis risk factors. Aortic Atherosclerosis (ICD10-I70.0). Electronically Signed   By: Keith Rake M.D.   On: 07/07/2021 23:05    Anti-infectives: Anti-infectives (From admission, onward)    Start     Dose/Rate Route Frequency Ordered Stop   07/08/21 1200  anidulafungin (ERAXIS) 100 mg in sodium chloride 0.9 % 100 mL IVPB  Status:  Discontinued       See Hyperspace for full Linked Orders Report.   100 mg 78 mL/hr over 100 Minutes Intravenous Every 24 hours 07/07/21 1015 07/07/21 1855   07/07/21 2000  fluconazole (DIFLUCAN) IVPB 300 mg  Status:  Discontinued        300 mg 75 mL/hr over 120 Minutes Intravenous Every 24 hours 07/07/21 1905 07/07/21 1913   07/07/21 2000  fluconazole (DIFLUCAN) IVPB 400 mg        400 mg 100 mL/hr over 120 Minutes Intravenous Every 24 hours 07/07/21 1913     07/07/21 1200  anidulafungin (ERAXIS) 200 mg in sodium chloride 0.9 % 200 mL IVPB       See Hyperspace for full Linked Orders Report.   200 mg 78 mL/hr over 200 Minutes Intravenous  Once 07/07/21 1015 07/07/21 1616   07/05/21 2200  piperacillin-tazobactam (ZOSYN) IVPB 2.25 g        2.25 g 100 mL/hr over 30 Minutes Intravenous Every 6 hours 07/05/21 1557 07/08/21 2359   07/01/21 1800  fluconazole (DIFLUCAN) IVPB 200 mg  Status:  Discontinued  200 mg 100 mL/hr over 60 Minutes Intravenous Every 24 hours 06/30/21 1328 07/07/21 1015   06/30/21 1800  fluconazole (DIFLUCAN) IVPB 400 mg        400 mg 100 mL/hr over 120 Minutes Intravenous  Once 06/30/21 1328 06/30/21 2205   06/30/21 0500  ceFAZolin (ANCEF) IVPB 1 g/50 mL premix  Status:   Discontinued       Note to Pharmacy: To be given in specials   1 g 100 mL/hr over 30 Minutes Intravenous  Once 06/29/21 0703 06/29/21 1637   06/30/21 0430  fluconazole (DIFLUCAN) IVPB 400 mg  Status:  Discontinued        400 mg 100 mL/hr over 120 Minutes Intravenous Every 24 hours 06/30/21 0337 06/30/21 1326   06/29/21 2330  piperacillin-tazobactam (ZOSYN) IVPB 2.25 g  Status:  Discontinued        2.25 g 100 mL/hr over 30 Minutes Intravenous Every 8 hours 06/29/21 2240 07/05/21 1557   06/29/21 1236  ceFAZolin (ANCEF) 1-4 GM/50ML-% IVPB       Note to Pharmacy: Corlis Hove   : cabinet override      06/29/21 1236 06/30/21 0044   06/28/21 1600  fluconazole (DIFLUCAN) tablet 150 mg        150 mg Oral  Once 06/28/21 1507 06/28/21 1649   06/27/21 0145  cefTRIAXone (ROCEPHIN) 2 g in sodium chloride 0.9 % 100 mL IVPB  Status:  Discontinued        2 g 200 mL/hr over 30 Minutes Intravenous Every 24 hours 06/27/21 0137 06/29/21 2302   06/27/21 0145  azithromycin (ZITHROMAX) 500 mg in sodium chloride 0.9 % 250 mL IVPB  Status:  Discontinued        500 mg 250 mL/hr over 60 Minutes Intravenous Every 24 hours 06/27/21 0137 06/29/21 1138       Assessment/Plan:  Critically ill, no surgical issues at this time. Continue current supportive care; vent, calories, antibiotics and drains We will follow  Caroleen Hamman, MD, FACS  07/08/2021

## 2021-07-09 DIAGNOSIS — N179 Acute kidney failure, unspecified: Secondary | ICD-10-CM | POA: Diagnosis not present

## 2021-07-09 DIAGNOSIS — N390 Urinary tract infection, site not specified: Secondary | ICD-10-CM | POA: Diagnosis not present

## 2021-07-09 DIAGNOSIS — N170 Acute kidney failure with tubular necrosis: Secondary | ICD-10-CM | POA: Diagnosis not present

## 2021-07-09 DIAGNOSIS — J9601 Acute respiratory failure with hypoxia: Secondary | ICD-10-CM | POA: Diagnosis not present

## 2021-07-09 LAB — HEMOGLOBIN A1C
Hgb A1c MFr Bld: 8.5 % — ABNORMAL HIGH (ref 4.8–5.6)
Mean Plasma Glucose: 197.25 mg/dL

## 2021-07-09 LAB — GLUCOSE, CAPILLARY
Glucose-Capillary: 139 mg/dL — ABNORMAL HIGH (ref 70–99)
Glucose-Capillary: 143 mg/dL — ABNORMAL HIGH (ref 70–99)
Glucose-Capillary: 168 mg/dL — ABNORMAL HIGH (ref 70–99)
Glucose-Capillary: 193 mg/dL — ABNORMAL HIGH (ref 70–99)
Glucose-Capillary: 201 mg/dL — ABNORMAL HIGH (ref 70–99)
Glucose-Capillary: 223 mg/dL — ABNORMAL HIGH (ref 70–99)
Glucose-Capillary: 229 mg/dL — ABNORMAL HIGH (ref 70–99)
Glucose-Capillary: 246 mg/dL — ABNORMAL HIGH (ref 70–99)
Glucose-Capillary: 261 mg/dL — ABNORMAL HIGH (ref 70–99)
Glucose-Capillary: 271 mg/dL — ABNORMAL HIGH (ref 70–99)
Glucose-Capillary: 276 mg/dL — ABNORMAL HIGH (ref 70–99)
Glucose-Capillary: 319 mg/dL — ABNORMAL HIGH (ref 70–99)
Glucose-Capillary: 349 mg/dL — ABNORMAL HIGH (ref 70–99)
Glucose-Capillary: 360 mg/dL — ABNORMAL HIGH (ref 70–99)
Glucose-Capillary: 372 mg/dL — ABNORMAL HIGH (ref 70–99)
Glucose-Capillary: 375 mg/dL — ABNORMAL HIGH (ref 70–99)
Glucose-Capillary: 386 mg/dL — ABNORMAL HIGH (ref 70–99)

## 2021-07-09 LAB — CBC
HCT: 24.4 % — ABNORMAL LOW (ref 36.0–46.0)
Hemoglobin: 7.7 g/dL — ABNORMAL LOW (ref 12.0–15.0)
MCH: 27.6 pg (ref 26.0–34.0)
MCHC: 31.6 g/dL (ref 30.0–36.0)
MCV: 87.5 fL (ref 80.0–100.0)
Platelets: 203 10*3/uL (ref 150–400)
RBC: 2.79 MIL/uL — ABNORMAL LOW (ref 3.87–5.11)
RDW: 15.2 % (ref 11.5–15.5)
WBC: 12.8 10*3/uL — ABNORMAL HIGH (ref 4.0–10.5)
nRBC: 0.3 % — ABNORMAL HIGH (ref 0.0–0.2)

## 2021-07-09 LAB — BASIC METABOLIC PANEL
Anion gap: 7 (ref 5–15)
BUN: 104 mg/dL — ABNORMAL HIGH (ref 8–23)
CO2: 22 mmol/L (ref 22–32)
Calcium: 7.6 mg/dL — ABNORMAL LOW (ref 8.9–10.3)
Chloride: 108 mmol/L (ref 98–111)
Creatinine, Ser: 3.54 mg/dL — ABNORMAL HIGH (ref 0.44–1.00)
GFR, Estimated: 13 mL/min — ABNORMAL LOW (ref >60)
Glucose, Bld: 394 mg/dL — ABNORMAL HIGH (ref 70–99)
Potassium: 4.1 mmol/L (ref 3.5–5.1)
Sodium: 137 mmol/L (ref 135–145)

## 2021-07-09 LAB — HEPARIN LEVEL (UNFRACTIONATED)
Heparin Unfractionated: 0.27 IU/mL — ABNORMAL LOW (ref 0.30–0.70)
Heparin Unfractionated: 0.46 IU/mL (ref 0.30–0.70)

## 2021-07-09 LAB — MAGNESIUM: Magnesium: 2 mg/dL (ref 1.7–2.4)

## 2021-07-09 LAB — PHOSPHORUS: Phosphorus: 3.6 mg/dL (ref 2.5–4.6)

## 2021-07-09 MED ORDER — INSULIN ASPART 100 UNIT/ML IJ SOLN
10.0000 [IU] | INTRAMUSCULAR | Status: DC
  Administered 2021-07-09 – 2021-07-13 (×21): 10 [IU] via SUBCUTANEOUS
  Filled 2021-07-09 (×20): qty 1

## 2021-07-09 MED ORDER — HEPARIN BOLUS VIA INFUSION
1100.0000 [IU] | Freq: Once | INTRAVENOUS | Status: AC
  Administered 2021-07-09: 1100 [IU] via INTRAVENOUS
  Filled 2021-07-09: qty 1100

## 2021-07-09 MED ORDER — INSULIN ASPART 100 UNIT/ML IJ SOLN
4.0000 [IU] | INTRAMUSCULAR | Status: DC
  Administered 2021-07-09: 4 [IU] via SUBCUTANEOUS
  Filled 2021-07-09: qty 1

## 2021-07-09 MED ORDER — MIDAZOLAM HCL 2 MG/2ML IJ SOLN
1.0000 mg | INTRAMUSCULAR | Status: DC | PRN
  Administered 2021-07-12 (×2): 2 mg via INTRAVENOUS
  Filled 2021-07-09 (×3): qty 2

## 2021-07-09 MED ORDER — INSULIN ASPART 100 UNIT/ML IJ SOLN
10.0000 [IU] | INTRAMUSCULAR | Status: DC
Start: 2021-07-10 — End: 2021-07-09

## 2021-07-09 MED ORDER — DEXTROSE 50 % IV SOLN: 0.0000 mL | INTRAVENOUS | Status: DC | PRN

## 2021-07-09 MED ORDER — INSULIN DETEMIR 100 UNIT/ML ~~LOC~~ SOLN
25.0000 [IU] | Freq: Two times a day (BID) | SUBCUTANEOUS | Status: DC
  Administered 2021-07-09: 25 [IU] via SUBCUTANEOUS
  Filled 2021-07-09 (×3): qty 0.25

## 2021-07-09 MED ORDER — INSULIN ASPART 100 UNIT/ML IJ SOLN: 10.0000 [IU] | INTRAMUSCULAR | Status: DC

## 2021-07-09 MED ORDER — INSULIN ASPART 100 UNIT/ML IJ SOLN
0.0000 [IU] | INTRAMUSCULAR | Status: DC
Start: 2021-07-09 — End: 2021-08-03
  Administered 2021-07-09: 20 [IU] via SUBCUTANEOUS
  Administered 2021-07-09: 15 [IU] via SUBCUTANEOUS
  Administered 2021-07-09 – 2021-07-10 (×3): 4 [IU] via SUBCUTANEOUS
  Administered 2021-07-10 (×2): 3 [IU] via SUBCUTANEOUS
  Administered 2021-07-10 – 2021-07-11 (×2): 4 [IU] via SUBCUTANEOUS
  Administered 2021-07-11: 7 [IU] via SUBCUTANEOUS
  Administered 2021-07-11: 4 [IU] via SUBCUTANEOUS
  Administered 2021-07-11: 3 [IU] via SUBCUTANEOUS
  Administered 2021-07-11: 7 [IU] via SUBCUTANEOUS
  Administered 2021-07-11: 4 [IU] via SUBCUTANEOUS
  Administered 2021-07-12: 11 [IU] via SUBCUTANEOUS
  Administered 2021-07-12: 7 [IU] via SUBCUTANEOUS
  Administered 2021-07-12 (×2): 4 [IU] via SUBCUTANEOUS
  Administered 2021-07-12: 15 [IU] via SUBCUTANEOUS
  Administered 2021-07-13: 7 [IU] via SUBCUTANEOUS
  Administered 2021-07-13: 3 [IU] via SUBCUTANEOUS
  Administered 2021-07-13: 11 [IU] via SUBCUTANEOUS
  Administered 2021-07-13: 4 [IU] via SUBCUTANEOUS
  Administered 2021-07-14: 3 [IU] via SUBCUTANEOUS
  Administered 2021-07-14 (×2): 4 [IU] via SUBCUTANEOUS
  Administered 2021-07-14: 11 [IU] via SUBCUTANEOUS
  Administered 2021-07-14: 7 [IU] via SUBCUTANEOUS
  Administered 2021-07-15: 4 [IU] via SUBCUTANEOUS
  Administered 2021-07-15: 3 [IU] via SUBCUTANEOUS
  Administered 2021-07-15: 4 [IU] via SUBCUTANEOUS
  Administered 2021-07-15 (×2): 7 [IU] via SUBCUTANEOUS
  Administered 2021-07-16 (×5): 3 [IU] via SUBCUTANEOUS
  Administered 2021-07-17: 11 [IU] via SUBCUTANEOUS
  Administered 2021-07-17: 3 [IU] via SUBCUTANEOUS
  Administered 2021-07-17: 4 [IU] via SUBCUTANEOUS
  Administered 2021-07-17 – 2021-07-18 (×6): 7 [IU] via SUBCUTANEOUS
  Administered 2021-07-18: 11 [IU] via SUBCUTANEOUS
  Administered 2021-07-18 – 2021-07-19 (×3): 7 [IU] via SUBCUTANEOUS
  Administered 2021-07-19: 11 [IU] via SUBCUTANEOUS
  Administered 2021-07-19: 4 [IU] via SUBCUTANEOUS
  Administered 2021-07-19: 15 [IU] via SUBCUTANEOUS
  Administered 2021-07-19: 11 [IU] via SUBCUTANEOUS
  Administered 2021-07-20 (×2): 4 [IU] via SUBCUTANEOUS
  Administered 2021-07-20 (×3): 7 [IU] via SUBCUTANEOUS
  Administered 2021-07-20: 4 [IU] via SUBCUTANEOUS
  Administered 2021-07-21: 3 [IU] via SUBCUTANEOUS
  Administered 2021-07-21: 6 [IU] via SUBCUTANEOUS
  Administered 2021-07-21 (×3): 4 [IU] via SUBCUTANEOUS
  Administered 2021-07-21: 7 [IU] via SUBCUTANEOUS
  Administered 2021-07-22 (×3): 4 [IU] via SUBCUTANEOUS
  Administered 2021-07-22: 11 [IU] via SUBCUTANEOUS
  Administered 2021-07-22 (×2): 3 [IU] via SUBCUTANEOUS
  Administered 2021-07-23: 7 [IU] via SUBCUTANEOUS
  Administered 2021-07-23: 3 [IU] via SUBCUTANEOUS
  Administered 2021-07-23: 4 [IU] via SUBCUTANEOUS
  Administered 2021-07-23: 3 [IU] via SUBCUTANEOUS
  Administered 2021-07-23 (×2): 4 [IU] via SUBCUTANEOUS
  Administered 2021-07-24: 3 [IU] via SUBCUTANEOUS
  Administered 2021-07-24 (×3): 4 [IU] via SUBCUTANEOUS
  Administered 2021-07-24: 3 [IU] via SUBCUTANEOUS
  Administered 2021-07-25: 4 [IU] via SUBCUTANEOUS
  Administered 2021-07-25: 3 [IU] via SUBCUTANEOUS
  Administered 2021-07-25 (×3): 4 [IU] via SUBCUTANEOUS
  Administered 2021-07-26: 3 [IU] via SUBCUTANEOUS
  Administered 2021-07-26: 4 [IU] via SUBCUTANEOUS
  Administered 2021-07-26: 3 [IU] via SUBCUTANEOUS
  Administered 2021-07-26: 4 [IU] via SUBCUTANEOUS
  Administered 2021-07-27: 3 [IU] via SUBCUTANEOUS
  Administered 2021-07-27: 4 [IU] via SUBCUTANEOUS
  Administered 2021-07-27: 3 [IU] via SUBCUTANEOUS
  Administered 2021-07-27 (×2): 4 [IU] via SUBCUTANEOUS
  Administered 2021-07-27: 3 [IU] via SUBCUTANEOUS
  Administered 2021-07-28: 4 [IU] via SUBCUTANEOUS
  Administered 2021-07-28 (×2): 3 [IU] via SUBCUTANEOUS
  Administered 2021-07-28: 4 [IU] via SUBCUTANEOUS
  Administered 2021-07-28 – 2021-07-29 (×2): 3 [IU] via SUBCUTANEOUS
  Administered 2021-07-29: 4 [IU] via SUBCUTANEOUS
  Administered 2021-07-29: 7 [IU] via SUBCUTANEOUS
  Administered 2021-07-29: 3 [IU] via SUBCUTANEOUS
  Administered 2021-07-29 – 2021-07-30 (×8): 4 [IU] via SUBCUTANEOUS
  Administered 2021-07-31 (×4): 3 [IU] via SUBCUTANEOUS
  Administered 2021-07-31 – 2021-08-01 (×2): 4 [IU] via SUBCUTANEOUS
  Administered 2021-08-01: 3 [IU] via SUBCUTANEOUS
  Administered 2021-08-01 – 2021-08-02 (×3): 4 [IU] via SUBCUTANEOUS
  Administered 2021-08-02: 7 [IU] via SUBCUTANEOUS
  Administered 2021-08-02: 4 [IU] via SUBCUTANEOUS
  Administered 2021-08-02: 7 [IU] via SUBCUTANEOUS
  Administered 2021-08-02: 11 [IU] via SUBCUTANEOUS
  Administered 2021-08-03: 3 [IU] via SUBCUTANEOUS
  Administered 2021-08-03: 4 [IU] via SUBCUTANEOUS
  Administered 2021-08-03: 3 [IU] via SUBCUTANEOUS
  Filled 2021-07-09 (×124): qty 1

## 2021-07-09 MED ORDER — INSULIN DETEMIR 100 UNIT/ML ~~LOC~~ SOLN: 25.0000 [IU] | Freq: Every day | SUBCUTANEOUS | Status: DC

## 2021-07-09 MED ORDER — INSULIN REGULAR(HUMAN) IN NACL 100-0.9 UT/100ML-% IV SOLN
INTRAVENOUS | Status: DC
  Administered 2021-07-09: 11.5 [IU]/h via INTRAVENOUS
  Administered 2021-07-10: 12 [IU]/h via INTRAVENOUS
  Filled 2021-07-09 (×2): qty 100

## 2021-07-09 MED ORDER — INSULIN DETEMIR 100 UNIT/ML ~~LOC~~ SOLN
43.0000 [IU] | Freq: Two times a day (BID) | SUBCUTANEOUS | Status: DC
  Administered 2021-07-09 – 2021-07-13 (×8): 43 [IU] via SUBCUTANEOUS
  Filled 2021-07-09 (×9): qty 0.43

## 2021-07-09 NOTE — Progress Notes (Signed)
POD # 9 perf duodenal ulcer s/p graham patch Critically ill requiring HD A fib heparin drip Drains 132cc serous Tolerating TF  PE NAD ABd: soft, penrose removed. Staples in place no infection JPs serous  A/p  Critically ill, no surgical issues at this time. Continue current supportive care; vent, calories, antibiotics and drains We will follow

## 2021-07-09 NOTE — Progress Notes (Signed)
NAME:  Heather Lopez, MRN:  [id number], DOB:  06/23/1946, LOS: 12 ADMISSION DATE:  06/27/2021, CONSULTATION DATE:  06/30/21 REFERRING MD:  Hampton Abbot, MD  CHIEF COMPLAINT:  Abdominal Pain   HPI  75 y.o with significant PMH as below who presented to the ED on 06/27/21 with chief complaints of progressive shortness of breath, productive cough, loss of appetite, nausea, dry heaving fatigue, malaise and generalized body aches. Patient initially went to her PCP on 7/29 with complaints of rhinorrhea, harsh cough, loss of appetite, nausea, fatigue, malaise and generalized body aches x2 days, patient was diagnosed with pneumonia of which she received ceftriaxone 1 mg IM and sent home with Levaquin, prednisone and Tessalon.  Per ED reports, patient reported progressive worsening of her symptoms during ED visit.  Past Medical History  Diabetes mellitus type 2 Anxiety and depression Hypertension Hyperlipidemia Hydronephrosis and recurrent UTI Gout and osteoarthritis Significant Hospital Events   8/2: Admitted to MedSurg unit with sepsis 8/3: Vascular consulted for HD catheter 8/4: Patient developed acute abdomen.  CT abdomen shows viscus perforation.  Patient taken to the OR urgently for exploratory lap.  Patient return to ICU on vent.  PCCM consulted 07/01/21- patient had SBT today for recruitment and chest physiotherapy.  Still has significant tachycardia and hypertension when awake (possible pain related) but mentation is good able to follow verbal communication.  07/02/21-patient has failed SBT today due to hypoxemia.  Reviewed care plan with surgery team.  07/03/21- Lung V/Q scan obtained ~ negative for PE.  Weaning FiO2, currently at 50% 8/9 severe resp failure, remains on vent 8/10 s/p Cystoscopy with Bilateral ureteral stent placement (63F/24 cm) for LEFT OBSTRUCTED KIDNEY STONE 8/11 remains on VENT 8/11 failed SAT/SBT  8/12 started on insulin drip for FSBS >400, developed A. fib with RVR CT abdomen pelvis  showed decreasing size of left suprarenal collection 8/13 transitioned off insulin, rate relatively controlled 8/14 issues with increased heart rate when Cardizem decreased.  Currently at 10 mg/h plus Lopressor IV every 6H, consider transitioning Cardizem to via OG in a.m.  Consults:  Nephrology Vascular PCCM General Surgery Urology  Procedures:  8/5: Exploratory Lap  8/10: Cystoscopy, bilateral ureteral stent placement, bilateral retrograde pyelography   Significant Diagnostic Tests:  8/2: Chest Xray>left basilar consolidation or atelectasis and suspected small left pleural effusion 8/2: Renal ultrasound> moderate right hydronephrosis, mild left hydronephrosis new with bilateral nephrolithiasis 8/4: CTA abdomen and pelvis> Extensive pneumoperitoneum compatible with perforated viscus. Gastric perforation is suspected, though bowel wall defect is not definitively identified. Surgical consultation is recommended. 2. Large ill-defined mass arising from the upper pole left kidney consistent with renal cell carcinoma. Dedicated renal MRI may be useful when clinical situation permits. 3. Acute left-sided hydronephrosis due to a 12 mm left UPJ calculus. 4. Chronic right hydronephrosis and hydroureter without clear etiology. 5. Bilateral nonobstructing renal calculi as above. 6. Small volume ascites. 7. Trace bilateral pleural effusions with patchy bibasilar atelectasis. 8/7: Venous US BLE>> negative for DVT 8/8: Lung V/Q Scan>>Negative for pulmonary embolus.  Micro Data:  8/2: SARS-CoV-2 PCR> negative 8/2: Influenza PCR> negative 8/2: Blood culture x2> no growth 8/2: Urine Culture> 30,000 colonies yeast 8/2: Legionella urinary antigen> negative 8/10: 70,000 colonies yeast, ID pend  Antimicrobials:  8/4 Fluconazole> increased to high dose 8/12 8/4 Zosyn>  OBJECTIVE  Blood pressure (!) 119/52, pulse 83, temperature 98.2 F (36.8 C), resp. rate (!) 21, height '5\' 2"'  (1.575 m),  weight 108 kg, SpO2 98 %.  Vent Mode: PRVC FiO2 (%):  [35 %-45 %] 35 % Set Rate:  [14 bmp] 14 bmp Vt Set:  [480 mL] 480 mL PEEP:  [8 cmH20] 8 cmH20 Plateau Pressure:  [18 cmH20] 18 cmH20   Intake/Output Summary (Last 24 hours) at 07/09/2021 1655 Last data filed at 07/09/2021 1420 Gross per 24 hour  Intake 3144.88 ml  Output 2282 ml  Net 862.88 ml    Filed Weights   07/07/21 0500 07/08/21 0500 07/09/21 0500  Weight: 106 kg 106 kg 108 kg   Physical Examination  GENERAL: 75 year-old acutely ill patient lying in the bed intubated, mechanically ventilated and sedated, for the most part synchronous with the ventilator  EYES: Pupils equal, round, reactive to light and accommodation. No scleral icterus.  HEENT: Head atraumatic, normocephalic. Orally intubated. NECK:  Supple, no jugular venous distention. No thyroid enlargement, no tenderness.  LUNGS: Mechanical breath sounds bilaterally, no wheezing, rales, fused rhonchi. No use of accessory muscles of respiration. Synchronous with the vent  CARDIOVASCULAR: Tachycardia, irregular rhythm, S1, S2. No murmurs, rubs, or gallops.  ABDOMEN: Distended, Midline incision with dressing dry and intact. JP Drains x 2 EXTREMITIES: No pedal edema, cyanosis, or clubbing.  NEUROLOGIC: Lightly sedated, arouses to voice and follow commands (moves all extremities to command), no focal deficits noted PSYCHIATRIC: The patient is intubated and sedated SKIN: Rash under breast and abdominal folds consistent with yeast. No OBVIOUS lesions   Labs   CBC: Recent Labs  Lab 07/03/21 0509 07/04/21 0440 07/05/21 0443 07/05/21 1244 07/05/21 2015 07/06/21 0440 07/07/21 0545 07/08/21 0440 07/09/21 0430  WBC 28.9*   < > 20.6*  --   --  14.8* 14.2* 12.1* 12.8*  NEUTROABS 26.6*  --  17.3*  --   --  13.5*  --   --   --   HGB 7.4*   < > 6.8*   < > 8.0* 7.5* 8.0* 7.6* 7.7*  HCT 22.3*   < > 21.8*   < > 25.6* 24.3* 26.7* 24.4* 24.4*  MCV 84.8   < > 87.6  --   --   91.0 91.4 88.7 87.5  PLT 161   < > 161  --   --  196 252 210 203   < > = values in this interval not displayed.     Basic Metabolic Panel: Recent Labs  Lab 07/05/21 0443 07/06/21 0440 07/07/21 0550 07/07/21 1945 07/08/21 0440 07/09/21 0430  NA 146* 144 155* 137 139 137  K 3.9 4.7 4.4 4.3 4.0 4.1  CL 113* 114* 124* 102 104 108  CO2 25 21* '24 25 27 22  ' GLUCOSE 235* 428* 164* 412* 226* 394*  BUN 104* 111* 138* 90* 98* 104*  CREATININE 3.79* 3.62* 3.74* 2.37* 2.87* 3.54*  CALCIUM 8.7* 8.5* 9.0 7.8* 8.1* 7.6*  MG 1.9 1.9  --  1.8 2.2 2.0  PHOS 2.9 2.8  --  3.0 3.9 3.6    GFR: Estimated Creatinine Clearance: 16.1 mL/min (A) (by C-G formula based on SCr of 3.54 mg/dL (H)). Recent Labs  Lab 07/06/21 0440 07/07/21 0545 07/08/21 0440 07/09/21 0430  WBC 14.8* 14.2* 12.1* 12.8*     Liver Function Tests: Recent Labs  Lab 07/03/21 0509 07/06/21 0440  AST 38 42*  ALT 34 46*  ALKPHOS 77 83  BILITOT 0.4 0.3  PROT 5.7* 5.9*  ALBUMIN 1.6* 1.5*    No results for input(s): LIPASE, AMYLASE in the last 168 hours. No results for input(s): AMMONIA in  the last 168 hours.  ABG    Component Value Date/Time   PHART 7.25 (L) 07/06/2021 0951   PCO2ART 46 07/06/2021 0951   PO2ART 57 (L) 07/06/2021 0951   HCO3 20.2 07/06/2021 0951   ACIDBASEDEF 6.7 (H) 07/06/2021 0951   O2SAT 83.9 07/06/2021 0951      Coagulation Profile: No results for input(s): INR, PROTIME in the last 168 hours.  Cardiac Enzymes: No results for input(s): CKTOTAL, CKMB, CKMBINDEX, TROPONINI in the last 168 hours.  CBG: Recent Labs  Lab 07/09/21 1229 07/09/21 1325 07/09/21 1441 07/09/21 1520 07/09/21 1627  GLUCAP 201* 246* 139* 261* 193*     Review of Systems:   UNABLE TO OBTAIN DUE TO PATIENT ON THE VENT AND SEDATED   Allergies Allergies  Allergen Reactions   Contrast Media  [Iodinated Diagnostic Agents] Anaphylaxis   Iodine Swelling    (IV only) - angioedema    Scheduled Meds:   chlorhexidine gluconate (MEDLINE KIT)  15 mL Mouth Rinse BID   Chlorhexidine Gluconate Cloth  6 each Topical Q0600   feeding supplement (PIVOT 1.5 CAL)  1,000 mL Per Tube Q24H   feeding supplement (PROSource TF)  45 mL Per Tube TID   free water  100 mL Per Tube Q3H   insulin aspart  0-20 Units Subcutaneous Q4H   insulin aspart  4 Units Subcutaneous Q4H   insulin detemir  25 Units Subcutaneous BID   mouth rinse  15 mL Mouth Rinse 10 times per day   metoprolol tartrate  5 mg Intravenous Q6H   multivitamin  15 mL Per Tube Daily   nutrition supplement (JUVEN)  1 packet Per Tube BID BM   pantoprazole (PROTONIX) IV  40 mg Intravenous Q12H   Continuous Infusions:  sodium chloride     sodium chloride 75 mL/hr (07/09/21 1253)   dexmedetomidine (PRECEDEX) IV infusion 0.8 mcg/kg/hr (07/09/21 1423)   diltiazem (CARDIZEM) infusion 10 mg/hr (07/09/21 1032)   fentaNYL infusion INTRAVENOUS Stopped (07/09/21 0746)   fluconazole (DIFLUCAN) IV Stopped (07/08/21 2245)   heparin 1,600 Units/hr (07/09/21 0923)   insulin 4 Units/hr (07/09/21 1620)   PRN Meds:.sodium chloride, acetaminophen, alteplase, dextrose, fentaNYL, heparin, lidocaine (PF), lidocaine-prilocaine, midazolam, ondansetron (ZOFRAN) IV, pentafluoroprop-tetrafluoroeth  Active Hospital Problem list   Acute hypoxic respiratory failure ventilator dependent Severe sepsis with septic shock Bowel perforation AKI on CKD stage IV Right UPJ stone with hydronephrosis Left renal fluid collection Anion gap metabolic acidosis Lactic acidosis Diabetes mellitus A. fib flutter with RVR Candiduria, yeast species being IDed  Assessment & Plan:  Acute Hypoxic Respiratory Failure in setting of Community Acquired Pneumonia,  bilateral pleural effusions with associated atelectasis -Full vent support, implement lung protective strategies -Increase PEEP to 8 -Wean FiO2 & PEEP as tolerated to maintain O2 sats >92% -Follow intermittent Chest X-ray & ABG as  needed -Spontaneous Breathing Trials when respiratory parameters met and mental status permits -Patient failed SBT today, developed RVR again -Continue VAP Bundle -PRN Bronchodilators -Continue Zosyn (previously on Ceftriaxone and Azithromycin)  Severe Sepsis secondary to UTI, Community Acquired Pneumonia and perforated Duodenal Ulcer with peritonitis Status post laparotomy Status post ureteral stent placement Left renal abscess, appears to be resolving Candiduria -Monitor fever curve -Trend WBC's & Procalcitonin -Follow cultures as above -Continue Zosyn & Fluconazole pending cultures & sensitivities -General Surgery following, appreciate input, will follow recommendations -On tube feeds tolerating so far -S/p exploratory laparotomy and primary repair of duodenal ulcer with omental patch on 06/29/21 -S/P bilateral ureteral stent placement 8/10  Atrial fibrillation with RVR On heparin infusion Continue Cardizem infusion at 10 mg/h Continue metoprolol IV currently 5 mg IV every 6 Avoiding amiodarone due to need for high dose fluconazole (QT prolongation effects) Corrected magnesium Treat anxiety/ventilator associated discomfort Supportive care Echo has shown increased LA size Consider transitioning Cardizem to via OG in a.m.   Septic Shock>>resolved Hypertension currently normotensive -Continuous cardiac monitoring -Maintain MAP >65 -IV fluids per nephrology   Acute kidney injury on CKD stage IV (baseline creatinine 1.97 and GFR of 25 on 12/22/19.) Right UPJ stone with hydronephrosis Suspected renal cell carcinoma, will need further eval/biopsy -Monitor I&O's / urinary output -Follow BMP -Ensure adequate renal perfusion -Avoid nephrotoxic agents as able -Replace electrolytes as indicated -Nephrology following, appreciate input -Apparently permacath nonfunctional, query need to DC -Followed by Urology outpatient with urethral stent placement in past  Anemia without  overt s/sx of bleeding -Monitor for S/Sx of bleeding -Trend CBC -Resumed heparin infusion due to A. fib RVR -Transfuse for Hgb <7  Diabetes mellitus -CBGs -Sliding scale insulin -Follow ICU hyper/hypoglycemia protocol -Hold home Meds   Pt is critically ill with multiorgan failure.  Prognosis is extremely guarded.  High risk for cardiac arrest and death.  Best practice:  Diet:  NPO, TPN Pain/Anxiety/Delirium protocol (if indicated): Yes (RASS goal 0) VAP protocol (if indicated): Yes DVT prophylaxis: Heparin gtt GI prophylaxis: PPI Glucose control:  SSI Yes Central venous access:  Yes, and it is still needed Arterial line:  Yes, and it is still needed Foley:  Yes, and it is still needed Mobility:  bed rest  PT consulted: N/A Last date of multidisciplinary goals of care discussion [8/8] Code Status:  full code Disposition: ICU  Updated pt's spouse Marlon Vonruden at bedside 07/08/21.  All questions answered.  The patient is critically ill with multiple organ systems failure and requires high complexity decision making for assessment and support, frequent evaluation and titration of therapies, application of advanced monitoring technologies and extensive interpretation of multiple databases. Critical Care Time devoted to patient care services described in this note is 40 minutes.   Renold Don, MD Advanced Bronchoscopy PCCM [facility]    *This note was dictated using voice recognition software/Dragon.  Despite best efforts to proofread, errors can occur which can change the meaning.  Any change was purely unintentional. .

## 2021-07-09 NOTE — Consult Note (Signed)
ANTICOAGULATION CONSULT NOTE  Pharmacy Consult for Heparin infusion Indication: afib  Patient Measurements: Heparin Dosing Weight: 75.6 kg   Labs: Recent Labs    07/07/21 0545 07/07/21 0550 07/07/21 1945 07/08/21 0440 07/08/21 0947 07/08/21 1808 07/09/21 0430 07/09/21 1516  HGB 8.0*  --   --  7.6*  --   --  7.7*  --   HCT 26.7*  --   --  24.4*  --   --  24.4*  --   PLT 252  --   --  210  --   --  203  --   HEPARINUNFRC  --   --   --   --    < > 0.12* 0.27* 0.46  CREATININE  --    < > 2.37* 2.87*  --   --  3.54*  --    < > = values in this interval not displayed.     Estimated Creatinine Clearance: 16.1 mL/min (A) (by C-G formula based on SCr of 3.54 mg/dL (H)).   Medical History: Past Medical History:  Diagnosis Date   Anxiety    Cataract    Depression    Diabetes mellitus without complication ([place])    Gout    History of kidney stones    Hyperlipidemia    Hypertension    Vertigo     Assessment:  [name] admitted 8/2 with acute renal failure, acute respiratory failure secondary to pneumonia. Hospital course complicated by perforated duodenal ulcer and patient was emergently taken to OR 8/5 for ExLap, repair of perforated duodenal ulcer. Patient was previously on heparin drip for PE and was later switched to prophylactic SQH - last dose was 8/12 at 1304. Pharmacy has been consulted for initiation and management of heparin infusion for afib.  8/13 0947 HL 0.11, subtherapeutic 8/13 1808 HL 0.12, subtherapeutic; drawn ~1 hour early; confirmed with nurse no issues with heparin infusion/line 8/14 0430 HL 0.27, subtherapeutic 8/14 1516 HL 0.46, therapeutic,    Goal of Therapy:  Heparin level 0.3-0.7 units/ml Monitor platelets by anticoagulation protocol: Yes   Plan:  Heparin level is therapeutic. Will continue heparin infusion at 1600 units/hr. Recheck heparin level in 8 hours. CBC daily while on heparin.    Sherilyn Banker, PharmD 07/09/2021 3:46 PM

## 2021-07-09 NOTE — Consult Note (Signed)
ANTICOAGULATION CONSULT NOTE  Pharmacy Consult for Heparin infusion Indication: afib  Patient Measurements: Heparin Dosing Weight: 75.6 kg   Labs: Recent Labs    07/07/21 0545 07/07/21 0550 07/07/21 1945 07/08/21 0440 07/08/21 0947 07/08/21 1808 07/09/21 0430  HGB 8.0*  --   --  7.6*  --   --  7.7*  HCT 26.7*  --   --  24.4*  --   --  24.4*  PLT 252  --   --  210  --   --  203  HEPARINUNFRC  --   --   --   --  0.11* 0.12* 0.27*  CREATININE  --    < > 2.37* 2.87*  --   --  3.54*   < > = values in this interval not displayed.     Estimated Creatinine Clearance: 16.1 mL/min (A) (by C-G formula based on SCr of 3.54 mg/dL (H)).   Medical History: Past Medical History:  Diagnosis Date   Anxiety    Cataract    Depression    Diabetes mellitus without complication ([place])    Gout    History of kidney stones    Hyperlipidemia    Hypertension    Vertigo     Assessment:  [name] admitted 8/2 with acute renal failure, acute respiratory failure secondary to pneumonia. Hospital course complicated by perforated duodenal ulcer and patient was emergently taken to OR 8/5 for ExLap, repair of perforated duodenal ulcer. Patient was previously on heparin drip for PE and was later switched to prophylactic SQH - last dose was 8/12 at 1304. Pharmacy has been consulted for initiation and management of heparin infusion for afib.  8/13 0947 HL 0.11, subtherapeutic 8/13 1808 HL 0.12, subtherapeutic; drawn ~1 hour early; confirmed with nurse no issues with heparin infusion/line 8/14 0430 HL 0.27, subtherapeutic   Goal of Therapy:  Heparin level 0.3-0.7 units/ml Monitor platelets by anticoagulation protocol: Yes   Plan:  Heparin level is subtherapeutic. Will give heparin bolus 1100 units x 1 and increase heparin infusion to 1600 units/hr. Recheck heparin level in 8 hours after rate change. CBC daily while on heparin.   Renda Rolls, PharmD, [facility] 07/09/2021 6:31 AM

## 2021-07-09 NOTE — Progress Notes (Signed)
[facility]  ROUNDING NOTE   Subjective:   Heather Lopez is a 75 y.o. white female who has been admitted for community acquired pneumonia.    Hospital course complicated by duodenal perforation s/p repair on 06/30/2021 ARF - requiring HD PRN Acute resp failure requiring mech ventilation Left obstructive hydronephrosis and renal abscess, Patient underwent cystoscopy and b/l ureteral stent placement 8/10  Patient seen today in ICU Patient remains critically ill patient remains intubated and sedated Patient is unable to offer any complaints   Objective:  Vital signs in last 24 hours:  Temp:  [98.4 F (36.9 C)-100.4 F (38 C)] 98.4 F (36.9 C) (08/14 0900) Pulse Rate:  [45-149] 80 (08/14 0900) Resp:  [18-28] 25 (08/14 0900) BP: (112-177)/(51-77) 177/77 (08/14 0800) SpO2:  [94 %-100 %] 100 % (08/14 0900) Arterial Line BP: (91-170)/(36-82) 147/47 (08/14 0900) FiO2 (%):  [35 %-45 %] 35 % (08/14 0800) Weight:  [176 kg] 108 kg (08/14 0500)  Weight change: 2 kg Filed Weights   07/07/21 0500 07/08/21 0500 07/09/21 0500  Weight: 106 kg 106 kg 108 kg    Intake/Output: I/O last 3 completed shifts: In: 4554.2 [I.V.:2326.9; NG/GT:1760; IV Piggyback:467.3] Out: 2052 [Urine:1850; Drains:202]   Intake/Output this shift:  Total I/O In: 813 [I.V.:813] Out: 450 [Urine:350; Drains:100]  Physical Exam: General: Critically ill  Head: ETT, OGT  Eyes: Anicteric  Lungs:  Vent assisted  Heart: Regular rate and rhythm  Abdomen:  +JP drains  Extremities:  trace peripheral edema.  Neurologic:  sedated  Skin: No lesions  Access:  RIJ temp cath  Left IJ PermCath-nonfunctional  Basic Metabolic Panel: Recent Labs  Lab 07/05/21 0443 07/06/21 0440 07/07/21 0550 07/07/21 1945 07/08/21 0440 07/09/21 0430  NA 146* 144 155* 137 139 137  K 3.9 4.7 4.4 4.3 4.0 4.1  CL 113* 114* 124* 102 104 108  CO2 25 21* _0 GLUCOSE 235* 428* 164* 412* 226* 394*  BUN 104* 111* 138*  90* 98* 104*  CREATININE 3.79* 3.62* 3.74* 2.37* 2.87* 3.54*  CALCIUM 8.7* 8.5* 9.0 7.8* 8.1* 7.6*  MG 1.9 1.9  --  1.8 2.2 2.0  PHOS 2.9 2.8  --  3.0 3.9 3.6    Liver Function Tests: Recent Labs  Lab 07/03/21 0509 07/06/21 0440  AST 38 42*  ALT 34 46*  ALKPHOS 77 83  BILITOT 0.4 0.3  PROT 5.7* 5.9*  ALBUMIN 1.6* 1.5*   No results for input(s): LIPASE, AMYLASE in the last 168 hours. No results for input(s): AMMONIA in the last 168 hours.  CBC: Recent Labs  Lab 07/03/21 0509 07/04/21 0440 07/05/21 0443 07/05/21 1244 07/05/21 2015 07/06/21 0440 07/07/21 0545 07/08/21 0440 07/09/21 0430  WBC 28.9*   < > 20.6*  --   --  14.8* 14.2* 12.1* 12.8*  NEUTROABS 26.6*  --  17.3*  --   --  13.5*  --   --   --   HGB 7.4*   < > 6.8*   < > 8.0* 7.5* 8.0* 7.6* 7.7*  HCT 22.3*   < > 21.8*   < > 25.6* 24.3* 26.7* 24.4* 24.4*  MCV 84.8   < > 87.6  --   --  91.0 91.4 88.7 87.5  PLT 161   < > 161  --   --  196 252 210 203   < > = values in this interval not displayed.    Cardiac Enzymes: No results for input(s): CKTOTAL, CKMB,  CKMBINDEX, TROPONINI in the last 168 hours.  BNP: Invalid input(s): POCBNP  CBG: Recent Labs  Lab 07/09/21 0608 07/09/21 0716 07/09/21 0832 07/09/21 0918 07/09/21 1035  GLUCAP 375* 319* 143* 168* 229*    Microbiology: Results for orders placed or performed during the hospital encounter of 06/27/21  Resp Panel by RT-PCR (Flu A&B, Covid) Nasopharyngeal Swab     Status: None   Collection Time: 06/27/21 12:32 AM   Specimen: Nasopharyngeal Swab; Nasopharyngeal(NP) swabs in vial transport medium  Result Value Ref Range Status   SARS Coronavirus 2 by RT PCR NEGATIVE NEGATIVE Final    Comment: (NOTE) SARS-CoV-2 target nucleic acids are NOT DETECTED.  The SARS-CoV-2 RNA is generally detectable in upper respiratory specimens during the acute phase of infection. The lowest concentration of SARS-CoV-2 viral copies this assay can detect is 138 copies/mL. A  negative result does not preclude SARS-Cov-2 infection and should not be used as the sole basis for treatment or other patient management decisions. A negative result may occur with  improper specimen collection/handling, submission of specimen other than nasopharyngeal swab, presence of viral mutation(s) within the areas targeted by this assay, and inadequate number of viral copies(<138 copies/mL). A negative result must be combined with clinical observations, patient history, and epidemiological information. The expected result is Negative.  Fact Sheet for Patients:  EntrepreneurPulse.com.au  Fact Sheet for Healthcare Providers:  IncredibleEmployment.be  This test is no t yet approved or cleared by the [place] FDA and  has been authorized for detection and/or diagnosis of SARS-CoV-2 by FDA under an Emergency Use Authorization (EUA). This EUA will remain  in effect (meaning this test can be used) for the duration of the COVID-19 declaration under Section 564(b)(1) of the Act, 21 U.S.C.section 360bbb-3(b)(1), unless the authorization is terminated  or revoked sooner.       Influenza A by PCR NEGATIVE NEGATIVE Final   Influenza B by PCR NEGATIVE NEGATIVE Final    Comment: (NOTE) The Xpert Xpress SARS-CoV-2/FLU/RSV plus assay is intended as an aid in the diagnosis of influenza from Nasopharyngeal swab specimens and should not be used as a sole basis for treatment. Nasal washings and aspirates are unacceptable for Xpert Xpress SARS-CoV-2/FLU/RSV testing.  Fact Sheet for Patients: EntrepreneurPulse.com.au  Fact Sheet for Healthcare Providers: IncredibleEmployment.be  This test is not yet approved or cleared by the [place] FDA and has been authorized for detection and/or diagnosis of SARS-CoV-2 by FDA under an Emergency Use Authorization (EUA). This EUA will remain in effect (meaning this test can  be used) for the duration of the COVID-19 declaration under Section 564(b)(1) of the Act, 21 U.S.C. section 360bbb-3(b)(1), unless the authorization is terminated or revoked.  Performed at [facility], [address] [place], [place] [address]   Blood culture (routine x 2)     Status: None   Collection Time: 06/27/21 12:43 AM   Specimen: BLOOD  Result Value Ref Range Status   Specimen Description BLOOD RIGHT ASSIST CONTROL  Final   Special Requests   Final    BOTTLES DRAWN AEROBIC AND ANAEROBIC Blood Culture results may not be optimal due to an inadequate volume of blood received in culture bottles   Culture   Final    NO GROWTH 5 DAYS Performed at [facility], [address] [place], [place] [address]    Report Status 07/02/2021 FINAL  Final  Blood culture (routine x 2)     Status: None   Collection Time: 06/27/21  1:45 AM   Specimen: BLOOD  Result Value Ref Range Status   Specimen Description BLOOD RIGHT ASSIST CONTROL  Final   Special Requests   Final    BOTTLES DRAWN AEROBIC AND ANAEROBIC Blood Culture adequate volume   Culture   Final    NO GROWTH 5 DAYS Performed at [facility], [address] [place], [place] [address]    Report Status 07/02/2021 FINAL  Final  Urine Culture     Status: Abnormal   Collection Time: 06/27/21  2:39 AM   Specimen: In/Out Cath Urine  Result Value Ref Range Status   Specimen Description   Final    IN/OUT CATH URINE Performed at [facility], [address], [place], [place] [address]    Special Requests   Final    NONE Performed at [facility], [address] [place], [place] [address]    Culture 30,000 COLONIES/mL YEAST (A)  Final   Report Status 06/28/2021 FINAL  Final  MRSA Next Gen by PCR, Nasal     Status: None   Collection Time: 06/30/21  5:01 AM   Specimen: Nasal Mucosa; Nasal Swab  Result Value Ref Range Status   MRSA by PCR Next Gen NOT DETECTED NOT DETECTED Final     Comment: (NOTE) The GeneXpert MRSA Assay (FDA approved for NASAL specimens only), is one component of a comprehensive MRSA colonization surveillance program. It is not intended to diagnose MRSA infection nor to guide or monitor treatment for MRSA infections. Test performance is not FDA approved in patients less than 40 years old. Performed at [facility], [address] [place], [place] [address]   Urine Culture     Status: Abnormal   Collection Time: 07/05/21 10:36 AM   Specimen: PATH Other; Urine  Result Value Ref Range Status   Specimen Description   Final    URINE, RANDOM LEFT RENAL PELVIS URINE Performed at [facility], [address], [place], [place] [address]    Special Requests   Final    NONE Performed at [facility] [address] [place], [place] [address]    Culture 70,000 COLONIES/mL YEAST (A)  Final   Report Status 07/06/2021 FINAL  Final  Urine Culture     Status: Abnormal   Collection Time: 07/05/21 10:48 AM   Specimen: PATH Other; Urine  Result Value Ref Range Status   Specimen Description   Final    URINE, RANDOM RIGHT KIDNEY URINE Performed at [facility], [address] [place], [place] [address]    Special Requests   Final    NONE Performed at [facility], [address] [place], [place] [address]    Culture >=100,000 COLONIES/mL YEAST (A)  Final   Report Status 07/06/2021 FINAL  Final    Coagulation Studies: No results for input(s): LABPROT, INR in the last 72 hours.   Urinalysis: No results for input(s): COLORURINE, LABSPEC, PHURINE, GLUCOSEU, HGBUR, BILIRUBINUR, KETONESUR, PROTEINUR, UROBILINOGEN, NITRITE, LEUKOCYTESUR in the last 72 hours.  Invalid input(s): APPERANCEUR     Imaging: CT ABDOMEN PELVIS WO CONTRAST  Result Date: 07/07/2021 CLINICAL DATA:  Retroperitoneal abscess. EXAM: CT ABDOMEN AND PELVIS WITHOUT CONTRAST TECHNIQUE: Multidetector CT imaging of the abdomen and  pelvis was performed following the standard protocol without IV contrast. COMPARISON:  Most recent CT 3 days ago 07/04/2021 FINDINGS: Lower chest: Left greater than right basilar airspace opacity, greater than expected for atelectasis on the left. There are small bilateral  pleural effusions. Enteric tube is in place. Central catheter is in the right atrium. Mild cardiomegaly. Hepatobiliary: No focal hepatic lesion. Question of subtle capsular nodularity. No intrahepatic collection. Gallbladder appears distended, no pericholecystic inflammation. No common bile duct dilatation. A drain enters from the right lateral abdomen courses under the left hepatic lobe in terminates in the left anterior abdomen. No fluid collection or free fluid along the course of the drain. Pancreas: Left retroperitoneal inflammation appears centered on the kidney rather than the tail the pancreas. No definite peripancreatic inflammation. No pancreatic ductal dilatation. Spleen: Normal in size without focal abnormality. Adrenals/Urinary Tract: No adrenal nodule. Right-sided nephroureteral stent in place with proximal pigtail at the ureteropelvic junction and distal pigtail in the bladder. Slight prominence of the major calices without renal pelvis dilatation. Nonobstructing right renal calculi, largest in the lower pole measures 10 mm. No stone or stone fragments along the course of the ureteral stent. There is mild right perinephric edema which is improved from prior exam. No definite right intrarenal collection. Left nephroureteral stent in place with proximal pigtail in the renal pelvis and distal pigtail in the bladder. Multiple left intrarenal calculi, largest in the upper pole measuring 10 mm. There is a questionable 5 mm calcification adjacent to the proximal ureteral stent at the level of L3 irregular fluid collection arising from the upper left kidney is incompletely characterized on this noncontrast exam, however appears diminished  in size from prior, currently 4.8 x 3.6 cm, measured on series 2, image 29, previously 7.0 x 4.2 cm. Adjacent retroperitoneal and perinephric fat stranding with mild improvement. The urinary bladder is completely decompressed by Foley catheter. Stomach/Bowel: Enteric tube tip in the stomach which is decompressed. There is no small bowel obstruction or inflammation. Enteric contrast is seen in the colon. Normal appendix tentatively visualized. Contrast filled colon without colonic wall thickening or inflammation. Minimal colonic diverticulosis without diverticulitis. Vascular/Lymphatic: Aortic atherosclerosis. No aortic aneurysm. There multiple prominent retroperitoneal and central mesenteric nodes are likely reactive. Reproductive: Occasional uterine calcifications, possible fibroids. No adnexal mass. Other: Right-sided abdominal drain as described above. There is a drain in the midline of the subcutaneous tissues of the anterior abdomen. Right lower quadrant drain courses into the pelvis with tip terminating on the left. No fluid or fluid collection along the course of the drain. Edema and subcutaneous gas involving the anterior abdominal wall without focal subcutaneous collection. Slightly more prominent subcutaneous edema involving the flank soft tissues. Midline laparotomy staples in place. No free intra-abdominal air. Musculoskeletal: Bony under mineralization with diffuse lumbar degenerative change. Advanced right hip arthropathy. Moderate to advanced left hip arthropathy. No evidence of intramuscular collection. IMPRESSION: 1. Decreased size of irregular fluid collection arising from the upper left kidney, currently 4.8 x 3.6 cm, previously 7.0 x 4.2 cm. Adjacent retroperitoneal and perinephric fat stranding with mild improvement. 2. Bilateral nephroureteral stents in place improvement and hydronephrosis on prior exam. There is a questionable 5 mm calcification adjacent to the proximal left ureteral stent at  the level of L3. 3. Bilateral nonobstructing renal calculi. 4. Surgical drains in place without evidence of fluid collection adjacent to the drainage catheters. 5. Edema and subcutaneous gas involving the anterior abdominal wall without focal subcutaneous collection. 6. Small bilateral pleural effusions. Left greater than right basilar airspace opacity, greater than expected for atelectasis on the left. This may represent pneumonia or aspiration. 7. Question of subtle capsular nodularity of the liver, can be seen with cirrhosis. Recommend correlation with cirrhosis risk factors.  Aortic Atherosclerosis (ICD10-I70.0). Electronically Signed   By: Keith Rake M.D.   On: 07/07/2021 23:05     Medications:    sodium chloride     sodium chloride 75 mL/hr at 07/09/21 0717   dexmedetomidine (PRECEDEX) IV infusion 0.8 mcg/kg/hr (07/09/21 0926)   diltiazem (CARDIZEM) infusion 10 mg/hr (07/09/21 1032)   fentaNYL infusion INTRAVENOUS Stopped (07/09/21 0746)   fluconazole (DIFLUCAN) IV Stopped (07/08/21 2245)   heparin 1,600 Units/hr (07/09/21 0923)   insulin 2.8 Units/hr (07/09/21 0937)    chlorhexidine gluconate (MEDLINE KIT)  15 mL Mouth Rinse BID   Chlorhexidine Gluconate Cloth  6 each Topical Q0600   feeding supplement (PIVOT 1.5 CAL)  1,000 mL Per Tube Q24H   feeding supplement (PROSource TF)  45 mL Per Tube TID   free water  100 mL Per Tube Q3H   mouth rinse  15 mL Mouth Rinse 10 times per day   metoprolol tartrate  5 mg Intravenous Q6H   multivitamin  15 mL Per Tube Daily   nutrition supplement (JUVEN)  1 packet Per Tube BID BM   pantoprazole (PROTONIX) IV  40 mg Intravenous Q12H     Assessment/ Plan:  Ms. TIRZA SENTENO is a 75 y.o.  female who has been admitted for community acquired pneumonia.    Acute Kidney Injury on chronic kidney disease stage 4 with baseline creatinine 1.97 and GFR of 25 on 12/22/19.  Acute kidney injury secondary to sepsis and obstructive uropathy Chronic kidney  disease is secondary to diabetic nephropathy We will continue to hold losartan and metformin Patient creatinine is worsening Patient urine output is lower than yesterday Patient was last dialyzed yesterday No need for renal replacement therapy today   Lab Results  Component Value Date   CREATININE 3.54 (H) 07/09/2021   CREATININE 2.87 (H) 07/08/2021   CREATININE 2.37 (H) 07/07/2021    Intake/Output Summary (Last 24 hours) at 07/09/2021 1057 Last data filed at 07/09/2021 0808 Gross per 24 hour  Intake 4619.82 ml  Output 1882 ml  Net 2737.82 ml   2. Acute Respiratory failure secondary to community acquired pneumonia failing outpatient antibiotics.  Requiring mechanical ventilation.   3. Anemia of chronic kidney disease Normocytic Lab Results  Component Value Date   HGB 7.7 (L) 07/09/2021   Low threshold to initiate ESA Dose given 07/01/21  4.  Diabetes mellitus type II with chronic kidney disease insulin dependent. Most recent hemoglobin A1c is 8.3 on 06/12/21.  Metformin held    5.Bilateral Nephrolithiasis- with obstructive uropathy and left renal abscess Right - Mild to moderate chronic hydronephrosis without obstructing stone Left-mild left hydronephrosis with 14 mm stone at left UPJ- b/l ureteral stent placed 8/10 Lesion at upper pole of left kidney enlarged - likely hematoma or abscess  6. S/p  duodenal perforation s/p repair on 06/30/2021  TPN being weaned off  7. Hypernatremia - free water deficit - increase free water supplement - 200 q 3 hrs Sodium is now better   Plan  Patient will most likely need renal replacement therapy tomorrow as patient urine output is decreasing and creatinine is increasing. We will continue current treatment for today.       LOS: 12 Georgene Kopper s Tykiera Raven 8/14/202210:57 AM

## 2021-07-10 ENCOUNTER — Inpatient Hospital Stay: Payer: Medicare Other

## 2021-07-10 DIAGNOSIS — J9601 Acute respiratory failure with hypoxia: Secondary | ICD-10-CM | POA: Diagnosis not present

## 2021-07-10 DIAGNOSIS — N1330 Unspecified hydronephrosis: Secondary | ICD-10-CM

## 2021-07-10 DIAGNOSIS — I4892 Unspecified atrial flutter: Secondary | ICD-10-CM | POA: Diagnosis not present

## 2021-07-10 DIAGNOSIS — N189 Chronic kidney disease, unspecified: Secondary | ICD-10-CM | POA: Diagnosis not present

## 2021-07-10 DIAGNOSIS — N170 Acute kidney failure with tubular necrosis: Secondary | ICD-10-CM | POA: Diagnosis not present

## 2021-07-10 DIAGNOSIS — N179 Acute kidney failure, unspecified: Secondary | ICD-10-CM | POA: Diagnosis not present

## 2021-07-10 DIAGNOSIS — N200 Calculus of kidney: Secondary | ICD-10-CM | POA: Diagnosis not present

## 2021-07-10 DIAGNOSIS — R6521 Severe sepsis with septic shock: Secondary | ICD-10-CM

## 2021-07-10 DIAGNOSIS — A419 Sepsis, unspecified organism: Secondary | ICD-10-CM | POA: Diagnosis not present

## 2021-07-10 DIAGNOSIS — N151 Renal and perinephric abscess: Secondary | ICD-10-CM | POA: Diagnosis not present

## 2021-07-10 LAB — BASIC METABOLIC PANEL
Anion gap: 11 (ref 5–15)
BUN: 131 mg/dL — ABNORMAL HIGH (ref 8–23)
CO2: 18 mmol/L — ABNORMAL LOW (ref 22–32)
Calcium: 7.8 mg/dL — ABNORMAL LOW (ref 8.9–10.3)
Chloride: 112 mmol/L — ABNORMAL HIGH (ref 98–111)
Creatinine, Ser: 3.35 mg/dL — ABNORMAL HIGH (ref 0.44–1.00)
GFR, Estimated: 14 mL/min — ABNORMAL LOW (ref >60)
Glucose, Bld: 256 mg/dL — ABNORMAL HIGH (ref 70–99)
Potassium: 3.3 mmol/L — ABNORMAL LOW (ref 3.5–5.1)
Sodium: 141 mmol/L (ref 135–145)

## 2021-07-10 LAB — CBC
HCT: 22.3 % — ABNORMAL LOW (ref 36.0–46.0)
Hemoglobin: 7.1 g/dL — ABNORMAL LOW (ref 12.0–15.0)
MCH: 27.5 pg (ref 26.0–34.0)
MCHC: 31.8 g/dL (ref 30.0–36.0)
MCV: 86.4 fL (ref 80.0–100.0)
Platelets: 171 10*3/uL (ref 150–400)
RBC: 2.58 MIL/uL — ABNORMAL LOW (ref 3.87–5.11)
RDW: 15.4 % (ref 11.5–15.5)
WBC: 10.5 10*3/uL (ref 4.0–10.5)
nRBC: 0 % (ref 0.0–0.2)

## 2021-07-10 LAB — GLUCOSE, CAPILLARY
Glucose-Capillary: 330 mg/dL — ABNORMAL HIGH (ref 70–99)
Glucose-Capillary: 304 mg/dL — ABNORMAL HIGH (ref 70–99)
Glucose-Capillary: 258 mg/dL — ABNORMAL HIGH (ref 70–99)
Glucose-Capillary: 180 mg/dL — ABNORMAL HIGH (ref 70–99)
Glucose-Capillary: 135 mg/dL — ABNORMAL HIGH (ref 70–99)
Glucose-Capillary: 144 mg/dL — ABNORMAL HIGH (ref 70–99)
Glucose-Capillary: 192 mg/dL — ABNORMAL HIGH (ref 70–99)
Glucose-Capillary: 126 mg/dL — ABNORMAL HIGH (ref 70–99)
Glucose-Capillary: 153 mg/dL — ABNORMAL HIGH (ref 70–99)
Glucose-Capillary: 162 mg/dL — ABNORMAL HIGH (ref 70–99)

## 2021-07-10 LAB — PHOSPHORUS: Phosphorus: 3.9 mg/dL (ref 2.5–4.6)

## 2021-07-10 LAB — MAGNESIUM: Magnesium: 2 mg/dL (ref 1.7–2.4)

## 2021-07-10 LAB — HEPARIN LEVEL (UNFRACTIONATED)
Heparin Unfractionated: 0.43 IU/mL (ref 0.30–0.70)
Heparin Unfractionated: 0.42 IU/mL (ref 0.30–0.70)

## 2021-07-10 MED ORDER — MIDAZOLAM HCL 2 MG/2ML IJ SOLN
INTRAMUSCULAR | Status: AC
  Administered 2021-07-10: 4 mg via INTRAVENOUS
  Filled 2021-07-10: qty 4

## 2021-07-10 MED ORDER — MIDAZOLAM HCL 2 MG/2ML IJ SOLN: 4.0000 mg | Freq: Once | INTRAMUSCULAR | Status: AC

## 2021-07-10 MED ORDER — SODIUM CHLORIDE 0.9 % IV SOLN
INTRAVENOUS | Status: DC
Start: 2021-07-10 — End: 2021-07-13

## 2021-07-10 MED ORDER — FENTANYL CITRATE (PF) 100 MCG/2ML IJ SOLN
100.0000 ug | Freq: Once | INTRAMUSCULAR | Status: AC
  Administered 2021-07-10: 100 ug via INTRAVENOUS

## 2021-07-10 NOTE — Procedures (Signed)
Intubation Procedure Note  Heather Lopez  [id number]  [birth year]  Date:07/10/21  Time:4:54 AM   Provider Performing:Izaiyah Kleinman L [place]ster    Procedure: Exchange of Endotracheal tube - Intubation (31500)  Indication(s) Blown Cuff and Respiratory Failure   Consent Unable to obtain consent due to emergent nature of procedure.   Anesthesia Versed and Fentanyl   Time Out Verified patient identification, verified procedure, site/side was marked, verified correct patient position, special equipment/implants available, medications/allergies/relevant history reviewed, required imaging and test results available.   Sterile Technique Usual hand hygeine, masks, and gloves were used   Procedure Description Patient positioned in bed supine.  Sedation given as noted above.  Patient's endotracheal tube was exchanged using  a bougie .  Number of attempts was 1.  Colorimetric CO2 detector was consistent with tracheal placement, equal breath sounds present   Complications/Tolerance None; patient tolerated the procedure well. Chest X-ray is ordered to verify placement.   EBL None   Specimen(s) None   Venetia Night, AGACNP-BC Acute Care Nurse Practitioner [facility]   [phone] / [phone] Please see Amion for pager details.

## 2021-07-10 NOTE — Progress Notes (Signed)
  ID: Heather Lopez is a 75 y.o. female Active Problems:   Uncontrolled type 2 diabetes mellitus with insulin therapy (HCC)   Hyperlipidemia associated with type 2 diabetes mellitus (HCC)   Acute respiratory failure with hypoxia (HCC)   Severe sepsis with septic shock (HCC)   Acute lower UTI   Acute kidney injury superimposed on chronic kidney disease (HCC)   Pneumoperitoneum   Pressure injury of skin   Atrial flutter with rapid ventricular response (HCC)   Endotracheally intubated   Bilateral nephrolithiasis   Anemia of chronic disease    Subjective: Remains intubated so no history available   Medications:   chlorhexidine gluconate (MEDLINE KIT)  15 mL Mouth Rinse BID   Chlorhexidine Gluconate Cloth  6 each Topical Q0600   feeding supplement (PIVOT 1.5 CAL)  1,000 mL Per Tube Q24H   feeding supplement (PROSource TF)  45 mL Per Tube TID   free water  100 mL Per Tube Q3H   insulin aspart  0-20 Units Subcutaneous Q4H   insulin aspart  10 Units Subcutaneous Q4H   insulin detemir  43 Units Subcutaneous BID   mouth rinse  15 mL Mouth Rinse 10 times per day   metoprolol tartrate  5 mg Intravenous Q6H   multivitamin  15 mL Per Tube Daily   nutrition supplement (JUVEN)  1 packet Per Tube BID BM   pantoprazole (PROTONIX) IV  40 mg Intravenous Q12H    Objective: Vital signs in last 24 hours: Temp:  [97.5 F (36.4 C)-100 F (37.8 C)] 100 F (37.8 C) (08/15 2000) Pulse Rate:  [53-102] 83 (08/15 2300) Resp:  [16-28] 19 (08/15 2300) BP: (95-159)/(34-77) 139/65 (08/15 2300) SpO2:  [95 %-100 %] 100 % (08/15 2311) Arterial Line BP: (95-174)/(37-87) 157/55 (08/15 2300) FiO2 (%):  [30 %] 30 % (08/15 2311) Weight:  [105.5 kg] 105.5 kg (08/15 0355)  PHYSICAL EXAM:  General: Intubated and sedated. Head: Normocephalic, without obvious abnormality, atraumatic. Eyes: Conjunctivae clear, anicteric sclerae. Pupils are equal ENT and we examined cannot be Examined NG tube Neck: Right IJ  dialysis catheter no carotid bruit and no JVD. S1 is Lungs: Lateral air entry Heart: Regular rate and rhythm, no murmur, rub or gallop. Abdomen: Soft, lap scar with staples. 2 JP drains. Extremities: atraumatic, no cyanosis. No edema. No clubbing Skin: No rashes or lesions. Or bruising Lymph: Cervical, supraclavicular normal. Neurologic: Not examined lab Results Recent Labs    07/09/21 0430 07/10/21 0503  WBC 12.8* 10.5  HGB 7.7* 7.1*  HCT 24.4* 22.3*  NA 137 141  K 4.1 3.3*  CL 108 112*  CO2 22 18*  BUN 104* 131*  CREATININE 3.54* 3.35*    Microbiology:  Studies/Results: DG Chest Port 1 View  Result Date: 07/10/2021 CLINICAL DATA:  Shortness of breath EXAM: PORTABLE CHEST 1 VIEW COMPARISON:  Portable exam 0517 hours compared to 07/04/2021 FINDINGS: Tip of endotracheal tube projects 2.4 cm above carina. Nasogastric tube extends into stomach. RIGHT jugular central venous catheter with tip projecting over SVC near cavoatrial junction. Dual lumen LEFT jugular central venous catheter with tips projecting over RIGHT atrium. Kink in LEFT jugular catheter in the LEFT cervical region. Enlargement of cardiac silhouette with vascular congestion. Scattered atelectasis in the mid to lower lungs. Minimal perihilar infiltrate question edema. No definite pleural effusion or pneumothorax. IMPRESSION: Scattered atelectasis and question minimal perihilar edema. Electronically Signed   By: Mark  Boles M.D.   On: 07/10/2021 08:19    CT abdomen   pelvis without contrast done on 07/07/2021 Decreased size of irregular fluid collection from the upper left kidney.  Current size is 4.8 ix3.6 cm.  P previously it was 7x4.2 Bilateral nephroureteral stents No left-sided hydronephrosis Assessment/Plan: Patient admitted with shortness of breath, possible pneumonia.  She then developed severe abdominal pain.  Found to have air in the abdomen. Duodenal perforation surgery on 06/30/2021 with closure of the  perforation with omental flap Completed treatment with Zosyn.   AKI on hemodialysis  Left hydronephrosis with UPJ obstruction underwent stenting on 07/05/2021 hydronephrosis is resolved.  Candida in the urine taken from the left and the right kidney.  Currently on high-dose fluconazole.  The left renal upper pole mass which was a hematoma versus abscess is much smaller now. Continue fluconazole.  Minimum 14 days.  Anemia  A. fib.  On diltiazem. discussed the management with the care team.

## 2021-07-10 NOTE — Progress Notes (Addendum)
Patient remains intubated,sedated and in A.Fib/Flutter. Husband in briefly this pm. Very upset and tearful. Patient unable to wean from ventilator. In renal failure and presently on hemodialysis. Patient opens eys at times and looks around. Massive diarrhea noted and flexiseal placed per MD order after approved by surgery Dr.Citron-Diaz.

## 2021-07-10 NOTE — TOC Progression Note (Signed)
Transition of Care [facility]) - Progression Note    Patient Details  Name: Heather Lopez MRN: [id number] Date of Birth: 1946/03/10  Transition of Care [facility]) CM/SW [place], [place] Phone Number: 07/10/2021, 8:51 AM  Clinical Narrative:     Patient remain critically ill on vent/sedated. Patient placed on insulin drip and currently on tube feeds. TOC will continue to follow.   Expected Discharge Plan: [place] Barriers to Discharge: Continued Medical Work up  Expected Discharge Plan and Services Expected Discharge Plan: [place] In-house Referral: Clinical Social Work   Post Acute Care Choice: [place] Living arrangements for the past 2 months: Single Family Home                                       Social Determinants of Health (SDOH) Interventions    Readmission Risk Interventions No flowsheet data found.

## 2021-07-10 NOTE — Progress Notes (Signed)
Inpatient Diabetes Program Recommendations  AACE/ADA: New Consensus Statement on Inpatient Glycemic Control (2015)  Target Ranges:  Prepandial:   less than 140 mg/dL      Peak postprandial:   less than 180 mg/dL (1-2 hours)      Critically ill patients:  140 - 180 mg/dL   Results for Heather Lopez, Heather Lopez (MRN [id number]) as of 07/10/2021 07:06  Ref. Range 07/09/2021 23:13 07/10/2021 02:15 07/10/2021 03:34 07/10/2021 04:37 07/10/2021 06:20  Glucose-Capillary Latest Ref Range: 70 - 99 mg/dL 360 (H) 330 (H) 304 (H)  IV Insulin Drip Restarted @0348  258 (H) 180 (H)      Home DM Meds: Novolog 6 units TID with meals + Correction                             Levemir 52 units BID                             Metformin 1000 mg BID                             Trulicity 5.59 mg Qweek  Current Orders: IV Insulin Drip     Levemir 43 units BID     Novolog Resistant Correction Scale/ SSI (0-20 units) Q4 hours     Novolog 10 units Q4 hours    Note patient has been on and off IV Insulin drip all weekend IV Insulin 8/10 @5pm  thru 1pm 08/11 IV Insulin 8/12 @1am  thru 1pm 08/12 IV Insulin 8/13 @7am  thru 5pm 08/13 IV Insulin 8/15 @3am  thru 6am 08/15  Note Tube Feeds running 40cc/hr  Of note, pt received 68 units Levemir yesterday 08/14 prior to the IV Insulin Drip being restarted late last night  TPN stopped 8/11   MD- Note IV Insulin Drip stopped again at 8:42am today.  Please consider:  Increase Levemir to 52 units BID (home dose)     --Will follow patient during hospitalization--  Wyn Quaker RN, MSN, CDE Diabetes Coordinator Inpatient Glycemic Control Team Team Pager: [phone] (8a-5p)

## 2021-07-10 NOTE — Progress Notes (Signed)
Post HD vitals 

## 2021-07-10 NOTE — Consult Note (Signed)
ANTICOAGULATION CONSULT NOTE  Pharmacy Consult for Heparin infusion Indication: afib  Patient Measurements: Heparin Dosing Weight: 75.6 kg   Labs: Recent Labs    07/07/21 0545 07/07/21 0550 07/07/21 1945 07/08/21 0440 07/08/21 0947 07/09/21 0430 07/09/21 1516 07/09/21 2303  HGB 8.0*  --   --  7.6*  --  7.7*  --   --   HCT 26.7*  --   --  24.4*  --  24.4*  --   --   PLT 252  --   --  210  --  203  --   --   HEPARINUNFRC  --   --   --   --    < > 0.27* 0.46 0.43  CREATININE  --    < > 2.37* 2.87*  --  3.54*  --   --    < > = values in this interval not displayed.     Estimated Creatinine Clearance: 16.1 mL/min (A) (by C-G formula based on SCr of 3.54 mg/dL (H)).   Medical History: Past Medical History:  Diagnosis Date   Anxiety    Cataract    Depression    Diabetes mellitus without complication ([place])    Gout    History of kidney stones    Hyperlipidemia    Hypertension    Vertigo     Assessment:  [name] admitted 8/2 with acute renal failure, acute respiratory failure secondary to pneumonia. Hospital course complicated by perforated duodenal ulcer and patient was emergently taken to OR 8/5 for ExLap, repair of perforated duodenal ulcer. Patient was previously on heparin drip for PE and was later switched to prophylactic SQH - last dose was 8/12 at 1304. Pharmacy has been consulted for initiation and management of heparin infusion for afib.  8/13 0947 HL 0.11, subtherapeutic 8/13 1808 HL 0.12, subtherapeutic; drawn ~1 hour early; confirmed with nurse no issues with heparin infusion/line 8/14 0430 HL 0.27, subtherapeutic 8/14 1516 HL 0.46, therapeutic,  8/14 2313 HL 0.43, therapeutic x 2   Goal of Therapy:  Heparin level 0.3-0.7 units/ml Monitor platelets by anticoagulation protocol: Yes   Plan:  Heparin level is therapeutic. Will continue heparin infusion at 1600 units/hr. Recheck HL daily with AM labs while therapeutic on heparin. CBC daily while on heparin.    Renda Rolls, PharmD, [facility] 07/10/2021 1:04 AM

## 2021-07-10 NOTE — Progress Notes (Signed)
Patient developed ETT cuff leak, ETT changed out by Domingo Pulse NP.  Patient tol well.

## 2021-07-10 NOTE — Progress Notes (Signed)
Pre HD RN assessment 

## 2021-07-10 NOTE — Progress Notes (Signed)
Pt HD end at [place]. Pt had 539ml removed (NS rinses from HD tx) and tolerated hd. Sedated, on ventilator, vss. No meds with HD ordered or given. Right IJ temp catheter limbs locked with 1.46ml heparin each. Report to primary RN.

## 2021-07-10 NOTE — Progress Notes (Signed)
[facility]  ROUNDING NOTE   Subjective:   Heather Lopez is a 75 y.o. white female who has been admitted for community acquired pneumonia.    Hospital course complicated by duodenal perforation s/p repair on 06/30/2021 ARF - requiring HD PRN Acute resp failure requiring mech ventilation Left obstructive hydronephrosis and renal abscess, Patient underwent cystoscopy and b/l ureteral stent placement 8/10  Critically ill Remains intubated and sedated with vent assistance Receiving tube feeds at 52m/hr Foley with clear yellow urine, 3.8L out in last 24 hours BUN 131 today   Objective:  Vital signs in last 24 hours:  Temp:  [97.5 F (36.4 C)-99.3 F (37.4 C)] 97.5 F (36.4 C) (08/15 0400) Pulse Rate:  [53-106] 81 (08/15 0800) Resp:  [20-27] 20 (08/15 0800) BP: (95-164)/(34-69) 105/48 (08/15 0800) SpO2:  [98 %-100 %] 100 % (08/15 0821) Arterial Line BP: (75-174)/(37-75) 95/65 (08/15 0800) FiO2 (%):  [30 %-35 %] 30 % (08/15 0821) Weight:  [105.5 kg] 105.5 kg (08/15 0355)  Weight change: -2.5 kg Filed Weights   07/08/21 0500 07/09/21 0500 07/10/21 0355  Weight: 106 kg 108 kg 105.5 kg    Intake/Output: I/O last 3 completed shifts: In: 6175.9 [I.V.:4366.5; NG/GT:1409.3; IV Piggyback:400.1] Out: 40086 [PYPPJ:0932 Drains:267; Stool:2]   Intake/Output this shift:  Total I/O In: 301.9 [I.V.:221.9; NG/GT:80] Out: -   Physical Exam: General: Critically ill  Head: ETT, OGT  Eyes: Anicteric  Lungs:  Vent assisted  Heart: Regular rate and rhythm  Abdomen:  +JP drains  Extremities:  trace peripheral edema.  Neurologic:  sedated  Skin: No lesions  Access:  RIJ temp cath  Left IJ PermCath-nonfunctional  Basic Metabolic Panel: Recent Labs  Lab 07/06/21 0440 07/07/21 0550 07/07/21 1945 07/08/21 0440 07/09/21 0430 07/10/21 0503  NA 144 155* 137 139 137 141  K 4.7 4.4 4.3 4.0 4.1 3.3*  CL 114* 124* 102 104 108 112*  CO2 21* '24 25 27 22 ' 18*  GLUCOSE 428* 164*  412* 226* 394* 256*  BUN 111* 138* 90* 98* 104* 131*  CREATININE 3.62* 3.74* 2.37* 2.87* 3.54* 3.35*  CALCIUM 8.5* 9.0 7.8* 8.1* 7.6* 7.8*  MG 1.9  --  1.8 2.2 2.0 2.0  PHOS 2.8  --  3.0 3.9 3.6 3.9     Liver Function Tests: Recent Labs  Lab 07/06/21 0440  AST 42*  ALT 46*  ALKPHOS 83  BILITOT 0.3  PROT 5.9*  ALBUMIN 1.5*    No results for input(s): LIPASE, AMYLASE in the last 168 hours. No results for input(s): AMMONIA in the last 168 hours.  CBC: Recent Labs  Lab 07/05/21 0443 07/05/21 1244 07/06/21 0440 07/07/21 0545 07/08/21 0440 07/09/21 0430 07/10/21 0503  WBC 20.6*  --  14.8* 14.2* 12.1* 12.8* 10.5  NEUTROABS 17.3*  --  13.5*  --   --   --   --   HGB 6.8*   < > 7.5* 8.0* 7.6* 7.7* 7.1*  HCT 21.8*   < > 24.3* 26.7* 24.4* 24.4* 22.3*  MCV 87.6  --  91.0 91.4 88.7 87.5 86.4  PLT 161  --  196 252 210 203 171   < > = values in this interval not displayed.     Cardiac Enzymes: No results for input(s): CKTOTAL, CKMB, CKMBINDEX, TROPONINI in the last 168 hours.  BNP: Invalid input(s): POCBNP  CBG: Recent Labs  Lab 07/10/21 0215 07/10/21 0334 07/10/21 0437 07/10/21 0620 07/10/21 0828  GLUCAP 330* 304* 258* 180* 135*  Microbiology: Results for orders placed or performed during the hospital encounter of 06/27/21  Resp Panel by RT-PCR (Flu A&B, Covid) Nasopharyngeal Swab     Status: None   Collection Time: 06/27/21 12:32 AM   Specimen: Nasopharyngeal Swab; Nasopharyngeal(NP) swabs in vial transport medium  Result Value Ref Range Status   SARS Coronavirus 2 by RT PCR NEGATIVE NEGATIVE Final    Comment: (NOTE) SARS-CoV-2 target nucleic acids are NOT DETECTED.  The SARS-CoV-2 RNA is generally detectable in upper respiratory specimens during the acute phase of infection. The lowest concentration of SARS-CoV-2 viral copies this assay can detect is 138 copies/mL. A negative result does not preclude SARS-Cov-2 infection and should not be used as the  sole basis for treatment or other patient management decisions. A negative result may occur with  improper specimen collection/handling, submission of specimen other than nasopharyngeal swab, presence of viral mutation(s) within the areas targeted by this assay, and inadequate number of viral copies(<138 copies/mL). A negative result must be combined with clinical observations, patient history, and epidemiological information. The expected result is Negative.  Fact Sheet for Patients:  EntrepreneurPulse.com.au  Fact Sheet for Healthcare Providers:  IncredibleEmployment.be  This test is no t yet approved or cleared by the [place] FDA and  has been authorized for detection and/or diagnosis of SARS-CoV-2 by FDA under an Emergency Use Authorization (EUA). This EUA will remain  in effect (meaning this test can be used) for the duration of the COVID-19 declaration under Section 564(b)(1) of the Act, 21 U.S.C.section 360bbb-3(b)(1), unless the authorization is terminated  or revoked sooner.       Influenza A by PCR NEGATIVE NEGATIVE Final   Influenza B by PCR NEGATIVE NEGATIVE Final    Comment: (NOTE) The Xpert Xpress SARS-CoV-2/FLU/RSV plus assay is intended as an aid in the diagnosis of influenza from Nasopharyngeal swab specimens and should not be used as a sole basis for treatment. Nasal washings and aspirates are unacceptable for Xpert Xpress SARS-CoV-2/FLU/RSV testing.  Fact Sheet for Patients: EntrepreneurPulse.com.au  Fact Sheet for Healthcare Providers: IncredibleEmployment.be  This test is not yet approved or cleared by the [place] FDA and has been authorized for detection and/or diagnosis of SARS-CoV-2 by FDA under an Emergency Use Authorization (EUA). This EUA will remain in effect (meaning this test can be used) for the duration of the COVID-19 declaration under Section 564(b)(1) of the  Act, 21 U.S.C. section 360bbb-3(b)(1), unless the authorization is terminated or revoked.  Performed at [facility], [address] [place], [place] [address]   Blood culture (routine x 2)     Status: None   Collection Time: 06/27/21 12:43 AM   Specimen: BLOOD  Result Value Ref Range Status   Specimen Description BLOOD RIGHT ASSIST CONTROL  Final   Special Requests   Final    BOTTLES DRAWN AEROBIC AND ANAEROBIC Blood Culture results may not be optimal due to an inadequate volume of blood received in culture bottles   Culture   Final    NO GROWTH 5 DAYS Performed at [facility], [address], [place], [place] [address]    Report Status 07/02/2021 FINAL  Final  Blood culture (routine x 2)     Status: None   Collection Time: 06/27/21  1:45 AM   Specimen: BLOOD  Result Value Ref Range Status   Specimen Description BLOOD RIGHT ASSIST CONTROL  Final   Special Requests   Final    BOTTLES DRAWN AEROBIC AND ANAEROBIC Blood  Culture adequate volume   Culture   Final    NO GROWTH 5 DAYS Performed at [facility], [address] [place], [place] [address]    Report Status 07/02/2021 FINAL  Final  Urine Culture     Status: Abnormal   Collection Time: 06/27/21  2:39 AM   Specimen: In/Out Cath Urine  Result Value Ref Range Status   Specimen Description   Final    IN/OUT CATH URINE Performed at [facility], [address], [place], [place] [address]    Special Requests   Final    NONE Performed at [facility], [place], [place] [address]    Culture 30,000 COLONIES/mL YEAST (A)  Final   Report Status 06/28/2021 FINAL  Final  MRSA Next Gen by PCR, Nasal     Status: None   Collection Time: 06/30/21  5:01 AM   Specimen: Nasal Mucosa; Nasal Swab  Result Value Ref Range Status   MRSA by PCR Next Gen NOT DETECTED NOT DETECTED Final    Comment: (NOTE) The GeneXpert MRSA Assay (FDA approved for NASAL specimens  only), is one component of a comprehensive MRSA colonization surveillance program. It is not intended to diagnose MRSA infection nor to guide or monitor treatment for MRSA infections. Test performance is not FDA approved in patients less than 25 years old. Performed at [facility], [address], [place], [place] [address]   Urine Culture     Status: Abnormal   Collection Time: 07/05/21 10:36 AM   Specimen: PATH Other; Urine  Result Value Ref Range Status   Specimen Description   Final    URINE, RANDOM LEFT RENAL PELVIS URINE Performed at [facility], [address] [place], [place] [address]    Special Requests   Final    NONE Performed at [facility], [address] [place], [place] [address]    Culture 70,000 COLONIES/mL YEAST (A)  Final   Report Status 07/06/2021 FINAL  Final  Urine Culture     Status: Abnormal   Collection Time: 07/05/21 10:48 AM   Specimen: PATH Other; Urine  Result Value Ref Range Status   Specimen Description   Final    URINE, RANDOM RIGHT KIDNEY URINE Performed at [facility], [address], [place], [place] [address]    Special Requests   Final    NONE Performed at [facility], [address] [place], [place] [address]    Culture >=100,000 COLONIES/mL YEAST (A)  Final   Report Status 07/06/2021 FINAL  Final    Coagulation Studies: No results for input(s): LABPROT, INR in the last 72 hours.   Urinalysis: No results for input(s): COLORURINE, LABSPEC, PHURINE, GLUCOSEU, HGBUR, BILIRUBINUR, KETONESUR, PROTEINUR, UROBILINOGEN, NITRITE, LEUKOCYTESUR in the last 72 hours.  Invalid input(s): APPERANCEUR     Imaging: DG Chest Port 1 View  Result Date: 07/10/2021 CLINICAL DATA:  Shortness of breath EXAM: PORTABLE CHEST 1 VIEW COMPARISON:  Portable exam 0517 hours compared to 07/04/2021 FINDINGS: Tip of endotracheal tube projects 2.4 cm above carina. Nasogastric tube extends into stomach.  RIGHT jugular central venous catheter with tip projecting over SVC near cavoatrial junction. Dual lumen LEFT jugular central venous catheter with tips projecting over RIGHT atrium. Kink in LEFT jugular catheter in the LEFT cervical region. Enlargement of cardiac silhouette with vascular congestion. Scattered atelectasis in the mid to lower lungs. Minimal perihilar infiltrate question edema. No definite pleural effusion or pneumothorax. IMPRESSION: Scattered  atelectasis and question minimal perihilar edema. Electronically Signed   By: Lavonia Dana M.D.   On: 07/10/2021 08:19     Medications:    sodium chloride     sodium chloride 75 mL/hr at 07/10/21 0800   dexmedetomidine (PRECEDEX) IV infusion 0.8 mcg/kg/hr (07/10/21 0509)   diltiazem (CARDIZEM) infusion Stopped (07/10/21 6244)   fentaNYL infusion INTRAVENOUS 150 mcg/hr (07/10/21 0800)   fluconazole (DIFLUCAN) IV Stopped (07/09/21 2324)   heparin 1,600 Units/hr (07/10/21 0800)   insulin Stopped (07/10/21 0842)    chlorhexidine gluconate (MEDLINE KIT)  15 mL Mouth Rinse BID   Chlorhexidine Gluconate Cloth  6 each Topical Q0600   feeding supplement (PIVOT 1.5 CAL)  1,000 mL Per Tube Q24H   feeding supplement (PROSource TF)  45 mL Per Tube TID   free water  100 mL Per Tube Q3H   insulin aspart  0-20 Units Subcutaneous Q4H   insulin aspart  10 Units Subcutaneous Q4H   insulin detemir  43 Units Subcutaneous BID   mouth rinse  15 mL Mouth Rinse 10 times per day   metoprolol tartrate  5 mg Intravenous Q6H   multivitamin  15 mL Per Tube Daily   nutrition supplement (JUVEN)  1 packet Per Tube BID BM   pantoprazole (PROTONIX) IV  40 mg Intravenous Q12H     Assessment/ Plan:  [name] is a 75 y.o.  female who has been admitted for community acquired pneumonia.    Acute Kidney Injury on chronic kidney disease stage 4 with baseline creatinine 1.97 and GFR of 25 on 12/22/19.  Acute kidney injury secondary to sepsis and obstructive  uropathy Chronic kidney disease is secondary to diabetic nephropathy We will continue to hold losartan and metformin Creatinine improved today, UOP adequate BUN continues to increase Will dialyze today, no UF  Lab Results  Component Value Date   CREATININE 3.35 (H) 07/10/2021   CREATININE 3.54 (H) 07/09/2021   CREATININE 2.87 (H) 07/08/2021    Intake/Output Summary (Last 24 hours) at 07/10/2021 0910 Last data filed at 07/10/2021 0800 Gross per 24 hour  Intake 4782.17 ml  Output 3592 ml  Net 1190.17 ml    2. Acute Respiratory failure secondary to community acquired pneumonia failing outpatient antibiotics.  Mechanical ventilation in use  3. Anemia of chronic kidney disease Normocytic Lab Results  Component Value Date   HGB 7.1 (L) 07/10/2021   Low threshold to initiate ESA EPO with this treatment  4.  Diabetes mellitus type II with chronic kidney disease insulin dependent. Most recent hemoglobin A1c is 8.3 on 06/12/21.  Metformin held, Glucose stable   5.Bilateral Nephrolithiasis- with obstructive uropathy and left renal abscess Right - Mild to moderate chronic hydronephrosis without obstructing stone Left-mild left hydronephrosis with 14 mm stone at left UPJ- b/l ureteral stent placed 8/10 Lesion at upper pole of left kidney enlarged - likely hematoma or abscess  6. S/p  duodenal perforation s/p repair on 06/30/2021  TPN weaned off, receiving tube feeds  7. Hypernatremia - free water deficit - increase free water supplement - 200 q 3 hrs Sodium is now better      LOS: 13   8/15/20229:10 AM

## 2021-07-10 NOTE — Progress Notes (Addendum)
[place] Hospital Day(s): [place] op day(s): 11 Days Post-Op.   Interval History:  Patient seen and examined No acute events or new complaints overnight.  Remains intubated/sedated Leukocytosis now resolved; WBC at 10.5K Hgb low, but relatively stable, 7.1 Renal function remains elevated, sCr - 3.35; UO - 3.8L Mild hypokalemia to 3.3 Surgical drains:             - Right mid abdomen (draining repair site); 65 ccs; serous             - RLQ (draining dependent pelvis): 70 ccs; serous Tube feedings advancing; 40 ccs/hr; at goal  She is having bowel function; multiple large loose stools daily   Vital signs in last 24 hours: [min-max] current  Temp:  [97.5 F (36.4 C)-99.3 F (37.4 C)] 97.5 F (36.4 C) (08/15 0400) Pulse Rate:  [53-106] 80 (08/15 0600) Resp:  [20-28] 21 (08/15 0600) BP: (95-177)/(34-77) 95/50 (08/15 0600) SpO2:  [98 %-100 %] 98 % (08/15 0600) Arterial Line BP: (75-174)/(37-75) 113/42 (08/15 0600) FiO2 (%):  [30 %-35 %] 30 % (08/15 0334) Weight:  [105.5 kg] 105.5 kg (08/15 0355)     Height: 5' 2.01" (157.5 cm) Weight: 105.5 kg BMI (Calculated): 42.53   Intake/Output last 2 shifts:  08/14 0701 - 08/15 0700 In: 5293.3 [I.V.:3683.9; NG/GT:1409.3; IV Piggyback:200.1] Out: 4042 [Urine:3805; Drains:235; Stool:2]   Physical Exam:  Constitutional: intubated, sedated HEENT: NGT in place; receiving tube feedings Respiratory: intubated Cardiovascular: regular rate, sinus rhythm Gastrointestinal: Soft, unable to reliably assess tenderness, non-distended, surgical drains x2 in right lower abdomen, output serous in both  Genitourinary: Foley in place  Integumentary: Midline incision is CDI with staples and penrose drain, no erythema, no drainage  Labs:  CBC Latest Ref Rng & Units 07/10/2021 07/09/2021 07/08/2021  WBC 4.0 - 10.5 K/uL 10.5 12.8(H) 12.1(H)  Hemoglobin 12.0 - 15.0 g/dL 7.1(L) 7.7(L) 7.6(L)  Hematocrit 36.0 - 46.0  % 22.3(L) 24.4(L) 24.4(L)  Platelets 150 - 400 K/uL 171 203 210   CMP Latest Ref Rng & Units 07/10/2021 07/09/2021 07/08/2021  Glucose 70 - 99 mg/dL 256(H) 394(H) 226(H)  BUN 8 - 23 mg/dL 131(H) 104(H) 98(H)  Creatinine 0.44 - 1.00 mg/dL 3.35(H) 3.54(H) 2.87(H)  Sodium 135 - 145 mmol/L 141 137 139  Potassium 3.5 - 5.1 mmol/L 3.3(L) 4.1 4.0  Chloride 98 - 111 mmol/L 112(H) 108 104  CO2 22 - 32 mmol/L 18(L) 22 27  Calcium 8.9 - 10.3 mg/dL 7.8(L) 7.6(L) 8.1(L)  Total Protein 6.5 - 8.1 g/dL - - -  Total Bilirubin 0.3 - 1.2 mg/dL - - -  Alkaline Phos 38 - 126 U/L - - -  AST 15 - 41 U/L - - -  ALT 0 - 44 U/L - - -     Imaging studies: No new pertinent imaging studies   Assessment/Plan:  75 y.o. critically ill requiring ventilator support with failure to wean female 11 days s/p exploratory laparotomy and primary repair of duodenal ulcer with omental patch   - Appreciate PCCM support; extubate per their service              - Continue tube feedings; at goal   - Okay to place stool management device  - Appreciate urology assistance; follow up UCx (growing yeast); on Diflucan  - Monitor abdominal examination; on-going bowel function - Maintain surgical drains; monitor and record output    All of the above findings and recommendations were  discussed with the medical team.   -- Edison Simon, PA-C [facility] 07/10/2021, 7:44 AM [phone] M-F: 7am - 4pm

## 2021-07-10 NOTE — Progress Notes (Signed)
GOALS OF CARE DISCUSSION  The Clinical status was relayed to family in detail. Daughter Vanessa Ralphs [phone]) and Husband Updated and notified of patients medical condition.  Patient remains unresponsive and will not open eyes to command.   Patient is having a weak cough and struggling to remove secretions.   Patient with increased WOB and using accessory muscles to breathe Explained to family course of therapy and the modalities    Patient with Progressive multiorgan failure with a very high probablity of a very minimal chance of meaningful recovery despite all aggressive and optimal medical therapy.  PATIENT REMAINS FULL CODE Failure to wean from vent Severe multiorgan failure, needs TRACH to survive Needs LTACH, need HD indefinitely  Family understands the situation. They will need to address CODE status and also decision for [facility]  Family are satisfied with Plan of action and management. All questions answered  Additional CC time 35 mins   Razi Hickle Patricia Pesa, M.D.  [facility]  Medical Director [place] Director [facility] Cardio-Pulmonary Department

## 2021-07-10 NOTE — Consult Note (Signed)
ANTICOAGULATION CONSULT NOTE  Pharmacy Consult for Heparin infusion Indication: afib  Patient Measurements: Heparin Dosing Weight: 75.6 kg   Labs: Recent Labs     0000 07/07/21 1945 07/08/21 0440 07/08/21 0947 07/09/21 0430 07/09/21 1516 07/09/21 2303 07/10/21 0503  HGB   < >  --  7.6*  --  7.7*  --   --  7.1*  HCT  --   --  24.4*  --  24.4*  --   --  22.3*  PLT  --   --  210  --  203  --   --  171  HEPARINUNFRC  --   --   --    < > 0.27* 0.46 0.43 0.42  CREATININE  --  2.37* 2.87*  --  3.54*  --   --   --    < > = values in this interval not displayed.     Estimated Creatinine Clearance: 15.9 mL/min (A) (by C-G formula based on SCr of 3.54 mg/dL (H)).   Medical History: Past Medical History:  Diagnosis Date   Anxiety    Cataract    Depression    Diabetes mellitus without complication ([place])    Gout    History of kidney stones    Hyperlipidemia    Hypertension    Vertigo     Assessment:  [name] admitted 8/2 with acute renal failure, acute respiratory failure secondary to pneumonia. Hospital course complicated by perforated duodenal ulcer and patient was emergently taken to OR 8/5 for ExLap, repair of perforated duodenal ulcer. Patient was previously on heparin drip for PE and was later switched to prophylactic SQH - last dose was 8/12 at 1304. Pharmacy has been consulted for initiation and management of heparin infusion for afib.  8/13 0947 HL 0.11, subtherapeutic 8/13 1808 HL 0.12, subtherapeutic; drawn ~1 hour early; confirmed with nurse no issues with heparin infusion/line 8/14 0430 HL 0.27, subtherapeutic 8/14 1516 HL 0.46, therapeutic,  8/14 2313 HL 0.43, therapeutic x 2 8/15 0503 HL, 0.42, therapeutic x 3   Goal of Therapy:  Heparin level 0.3-0.7 units/ml Monitor platelets by anticoagulation protocol: Yes   Plan:  Heparin level is therapeutic. Will continue heparin infusion at 1600 units/hr. Recheck HL daily with AM labs while therapeutic on  heparin. CBC daily while on heparin.   Renda Rolls, PharmD, [facility] 07/10/2021 6:52 AM

## 2021-07-10 NOTE — Progress Notes (Signed)
Uncontrolled Type 2 Diabetes Mellitus Patient's glucose over the weekend has been very difficult to control with SSI and Levemir SQ. She has required insulin drip support each of the last 3 days. Once transitioned off with Levemir 25 units, 35 units & 43 units in addition to multiple doses of short acting insulin doses, CBG readings remain consistently  > 300.  - placed back on insulin drip - diabetes coordinator re-consulted   Domingo Pulse [place], AGACNP-BC Acute Care Nurse Practitioner [facility] [place]   [phone] / [phone] Please see Amion for pager details.

## 2021-07-10 NOTE — Progress Notes (Signed)
NAME:  Heather Lopez, MRN:  [id number], DOB:  [birth year], LOS: 13 ADMISSION DATE:  06/27/2021, CONSULTATION DATE:  06/30/21 REFERRING MD:  Aleen Campi, MD  CHIEF COMPLAINT:  Abdominal Pain   HPI  75 y.o with significant PMH as below who presented to the ED on 06/27/21 with chief complaints of progressive shortness of breath, productive cough, loss of appetite, nausea, dry heaving fatigue, malaise and generalized body aches. Patient initially went to her PCP on 7/29 with complaints of rhinorrhea, harsh cough, loss of appetite, nausea, fatigue, malaise and generalized body aches x2 days, patient was diagnosed with pneumonia of which she received ceftriaxone 1 mg IM and sent home with Levaquin, prednisone and Tessalon.  Per ED reports, patient reported progressive worsening of her symptoms during ED visit.  Past Medical History  Diabetes mellitus type 2 Anxiety and depression Hypertension Hyperlipidemia Hydronephrosis and recurrent UTI Gout and osteoarthritis Significant Hospital Events   8/2: Admitted to MedSurg unit with sepsis 8/3: Vascular consulted for HD catheter 8/4: Patient developed acute abdomen.  CT abdomen shows viscus perforation.  Patient taken to the OR urgently for exploratory lap.  Patient return to ICU on vent.  PCCM consulted 07/01/21- patient had SBT today for recruitment and chest physiotherapy.  Still has significant tachycardia and hypertension when awake (possible pain related) but mentation is good able to follow verbal communication.  07/02/21-patient has failed SBT today due to hypoxemia.  Reviewed care plan with surgery team.  07/03/21- Lung V/Q scan obtained ~ negative for PE.  Weaning FiO2, currently at 50% 8/9 severe resp failure, remains on vent 8/10 s/p Cystoscopy with Bilateral ureteral stent placement (78F/24 cm) for LEFT OBSTRUCTED KIDNEY STONE 8/11 remains on VENT 8/11 failed SAT/SBT  8/12 started on insulin drip for FSBS >400, developed A. fib with RVR CT abdomen pelvis  showed decreasing size of left suprarenal collection 8/13 transitioned off insulin, rate relatively controlled 8/14 issues with increased heart rate when Cardizem decreased.  Currently at 10 mg/h plus Lopressor IV every 6H, consider transitioning Cardizem to via OG in a.m. 8/15 failing weaning trials   Consults:  Nephrology Vascular PCCM General Surgery Urology  Procedures:  8/5: Exploratory Lap  8/10: Cystoscopy, bilateral ureteral stent placement, bilateral retrograde pyelography   Significant Diagnostic Tests:  8/2: Chest Xray>left basilar consolidation or atelectasis and suspected small left pleural effusion 8/2: Renal ultrasound> moderate right hydronephrosis, mild left hydronephrosis new with bilateral nephrolithiasis 8/4: CTA abdomen and pelvis> Extensive pneumoperitoneum compatible with perforated viscus. Gastric perforation is suspected, though bowel wall defect is not definitively identified. Surgical consultation is recommended. 2. Large ill-defined mass arising from the upper pole left kidney consistent with renal cell carcinoma. Dedicated renal MRI may be useful when clinical situation permits. 3. Acute left-sided hydronephrosis due to a 12 mm left UPJ calculus. 4. Chronic right hydronephrosis and hydroureter without clear etiology. 5. Bilateral nonobstructing renal calculi as above. 6. Small volume ascites. 7. Trace bilateral pleural effusions with patchy bibasilar atelectasis. 8/7: Venous US BLE>> negative for DVT 8/8: Lung V/Q Scan>>Negative for pulmonary embolus.  Micro Data:  8/2: SARS-CoV-2 PCR> negative 8/2: Influenza PCR> negative 8/2: Blood culture x2> no growth 8/2: Urine Culture> 30,000 colonies yeast 8/2: Legionella urinary antigen> negative 8/10: 70,000 colonies yeast, ID pend  Antimicrobials:  8/4 Fluconazole> increased to high dose 8/12 8/4 Zosyn>   INTERVAL CHANGES Remains intubated ETT changed out Failing weaning trials Severe resp  failure    REVIEW OF SYSTEMS  PATIENT IS UNABLE TO PROVIDE  COMPLETE REVIEW OF SYSTEMS DUE TO SEVERE CRITICAL ILLNESS AND TOXIC METABOLIC ENCEPHALOPATHY   PHYSICAL EXAMINATION:  GENERAL:critically ill appearing, +resp distress EYES: Pupils equal, round, reactive to light.  No scleral icterus.  MOUTH: Moist mucosal membrane. INTUBATED NECK: Supple.  PULMONARY: +rhonchi, +wheezing CARDIOVASCULAR: S1 and S2.  No murmurs  GASTROINTESTINAL: Distended, Midline incision with dressing dry and intact. JP Drains x 2 MUSCULOSKELETAL: edema.  NEUROLOGIC: obtunded SKIN:intact,warm,dry     OBJECTIVE  Blood pressure (!) 95/50, pulse 80, temperature (!) 97.5 F (36.4 C), resp. rate (!) 21, height 5' 2.01" (1.575 m), weight 105.5 kg, SpO2 98 %.    Vent Mode: PRVC FiO2 (%):  [30 %-35 %] 30 % Set Rate:  [12 bmp-14 bmp] 12 bmp Vt Set:  [480 mL] 480 mL PEEP:  [8 cmH20] 8 cmH20   Intake/Output Summary (Last 24 hours) at 07/10/2021 0739 Last data filed at 07/10/2021 0600 Gross per 24 hour  Intake 4480.25 ml  Output 4042 ml  Net 438.25 ml    Filed Weights   07/08/21 0500 07/09/21 0500 07/10/21 0355  Weight: 106 kg 108 kg 105.5 kg      Labs   CBC: Recent Labs  Lab 07/05/21 0443 07/05/21 1244 07/06/21 0440 07/07/21 0545 07/08/21 0440 07/09/21 0430 07/10/21 0503  WBC 20.6*  --  14.8* 14.2* 12.1* 12.8* 10.5  NEUTROABS 17.3*  --  13.5*  --   --   --   --   HGB 6.8*   < > 7.5* 8.0* 7.6* 7.7* 7.1*  HCT 21.8*   < > 24.3* 26.7* 24.4* 24.4* 22.3*  MCV 87.6  --  91.0 91.4 88.7 87.5 86.4  PLT 161  --  196 252 210 203 171   < > = values in this interval not displayed.     Basic Metabolic Panel: Recent Labs  Lab 07/06/21 0440 07/07/21 0550 07/07/21 1945 07/08/21 0440 07/09/21 0430 07/10/21 0503  NA 144 155* 137 139 137 141  K 4.7 4.4 4.3 4.0 4.1 3.3*  CL 114* 124* 102 104 108 112*  CO2 21* _0 18*  GLUCOSE 428* 164* 412* 226* 394* 256*  BUN 111* 138* 90* 98* 104*  131*  CREATININE 3.62* 3.74* 2.37* 2.87* 3.54* 3.35*  CALCIUM 8.5* 9.0 7.8* 8.1* 7.6* 7.8*  MG 1.9  --  1.8 2.2 2.0 2.0  PHOS 2.8  --  3.0 3.9 3.6 3.9    GFR: Estimated Creatinine Clearance: 16.8 mL/min (A) (by C-G formula based on SCr of 3.35 mg/dL (H)). Recent Labs  Lab 07/07/21 0545 07/08/21 0440 07/09/21 0430 07/10/21 0503  WBC 14.2* 12.1* 12.8* 10.5     Liver Function Tests: Recent Labs  Lab 07/06/21 0440  AST 42*  ALT 46*  ALKPHOS 83  BILITOT 0.3  PROT 5.9*  ALBUMIN 1.5*    No results for input(s): LIPASE, AMYLASE in the last 168 hours. No results for input(s): AMMONIA in the last 168 hours.  ABG    Component Value Date/Time   PHART 7.25 (L) 07/06/2021 0951   PCO2ART 46 07/06/2021 0951   PO2ART 57 (L) 07/06/2021 0951   HCO3 20.2 07/06/2021 0951   ACIDBASEDEF 6.7 (H) 07/06/2021 0951   O2SAT 83.9 07/06/2021 0951      Allergies Allergies  Allergen Reactions   Contrast Media  [Iodinated Diagnostic Agents] Anaphylaxis   Iodine Swelling    (IV only) - angioedema    Scheduled Meds:  chlorhexidine gluconate (MEDLINE KIT)  15 mL Mouth  Rinse BID   Chlorhexidine Gluconate Cloth  6 each Topical Q0600   feeding supplement (PIVOT 1.5 CAL)  1,000 mL Per Tube Q24H   feeding supplement (PROSource TF)  45 mL Per Tube TID   free water  100 mL Per Tube Q3H   insulin aspart  0-20 Units Subcutaneous Q4H   insulin aspart  10 Units Subcutaneous Q4H   insulin detemir  43 Units Subcutaneous BID   mouth rinse  15 mL Mouth Rinse 10 times per day   metoprolol tartrate  5 mg Intravenous Q6H   multivitamin  15 mL Per Tube Daily   nutrition supplement (JUVEN)  1 packet Per Tube BID BM   pantoprazole (PROTONIX) IV  40 mg Intravenous Q12H   Continuous Infusions:  sodium chloride     sodium chloride 75 mL/hr at 07/10/21 0600   dexmedetomidine (PRECEDEX) IV infusion 0.8 mcg/kg/hr (07/10/21 0509)   diltiazem (CARDIZEM) infusion 5 mg/hr (07/10/21 0600)   fentaNYL infusion  INTRAVENOUS 150 mcg/hr (07/10/21 0600)   fluconazole (DIFLUCAN) IV Stopped (07/09/21 2324)   heparin 1,600 Units/hr (07/10/21 0600)   insulin 9.5 Units/hr (07/10/21 0600)   PRN Meds:.sodium chloride, acetaminophen, alteplase, dextrose, fentaNYL, heparin, lidocaine (PF), lidocaine-prilocaine, midazolam, ondansetron (ZOFRAN) IV, pentafluoroprop-tetrafluoroeth  Active Hospital Problem list   Acute hypoxic respiratory failure ventilator dependent Severe sepsis with septic shock Bowel perforation AKI on CKD stage IV Right UPJ stone with hydronephrosis Left renal fluid collection Anion gap metabolic acidosis Lactic acidosis Diabetes mellitus A. fib flutter with RVR Candiduria, yeast species being IDed  Assessment & Plan:   Acute Hypoxic Respiratory Failure in setting of Community Acquired Pneumonia,  bilateral pleural effusions with associated atelectasis with perforated Duodenal Ulcer with peritonitis Status post laparotomy Status post ureteral stent placement Left renal abscess, appears to be resolving Candiduria  Severe ACUTE Hypoxic and Hypercapnic Respiratory Failure -continue Mechanical Ventilator support -continue Bronchodilator Therapy -Wean Fio2 and PEEP as tolerated -VAP/VENT bundle implementation Unable to wean from vent Will likely need trach -Continue Zosyn (previously on Ceftriaxone and Azithromycin)  SEPTIC shock/SEVERE SEPSIS SOURCE- Severe Sepsis secondary to UTI, Community Acquired Pneumonia and perforated Duodenal Ulcer with peritonitis Status post laparotomy Status post ureteral stent placement Left renal abscess, appears to be resolving Candiduria -use vasopressors to keep MAP>65 as needed -follow ABG and LA as needed -follow up cultures -S/p exploratory laparotomy and primary repair of duodenal ulcer with omental patch on 06/29/21 -S/P bilateral ureteral stent placement 8/10   ACUTE CARDIAC FAILURE- Atrial fibrillation with RVR -follow up cardiology  recs On heparin infusion Continue Cardizem infusion at 10 mg/h Continue metoprolol IV currently 5 mg IV every 6 Avoiding amiodarone due to need for high dose fluconazole (QT prolongation effects)   Acute kidney injury on CKD stage IV (baseline creatinine 1.97 and GFR of 25 on 12/22/19.) Right UPJ stone with hydronephrosis Suspected renal cell carcinoma, will need further eval/biopsy Need vasc surgery input to evaluate perm cath  ACUTE ANEMIA- TRANSFUSE AS NEEDED CONSIDER TRANSFUSION  IF HGB<7 DVT PRX with TED/SCD's ONLY  ENDO - ICU hypoglycemic\Hyperglycemia protocol -check FSBS per protocol  ELECTROLYTES -follow labs as needed -replace as needed -pharmacy consultation and following    Best practice:  Diet:  NPO, TPN Pain/Anxiety/Delirium protocol (if indicated): Yes (RASS goal 0) VAP protocol (if indicated): Yes DVT prophylaxis: Heparin gtt GI prophylaxis: PPI Glucose control:  SSI Yes Central venous access:  Yes, and it is still needed Arterial line:  Yes, and it is still needed Foley:  Yes, and it is still needed Mobility:  bed rest  PT consulted: N/A Last date of multidisciplinary goals of care discussion [8/8] Code Status:  full code Disposition: ICU    DVT/GI PRX  assessed I Assessed the need for Labs I Assessed the need for Foley I Assessed the need for Central Venous Line Family Discussion when available I Assessed the need for Mobilization I made an Assessment of medications to be adjusted accordingly Safety Risk assessment completed  CASE DISCUSSED IN MULTIDISCIPLINARY ROUNDS WITH ICU TEAM     Critical Care Time devoted to patient care services described in this note is 55  minutes.    Critical care was necessary to treat /prevent imminent and life-threatening deterioration.  Overall, patient is critically ill, prognosis is guarded.  Patient with Multiorgan failure and at high risk for cardiac arrest and death.    Corrin Parker, M.D.   [facility]  Medical Director [place] Director [facility] Cardio-Pulmonary Department

## 2021-07-11 DIAGNOSIS — J9601 Acute respiratory failure with hypoxia: Secondary | ICD-10-CM | POA: Diagnosis not present

## 2021-07-11 DIAGNOSIS — N189 Chronic kidney disease, unspecified: Secondary | ICD-10-CM | POA: Diagnosis not present

## 2021-07-11 DIAGNOSIS — N200 Calculus of kidney: Secondary | ICD-10-CM | POA: Diagnosis not present

## 2021-07-11 DIAGNOSIS — I248 Other forms of acute ischemic heart disease: Secondary | ICD-10-CM

## 2021-07-11 DIAGNOSIS — N170 Acute kidney failure with tubular necrosis: Secondary | ICD-10-CM | POA: Diagnosis not present

## 2021-07-11 DIAGNOSIS — N151 Renal and perinephric abscess: Secondary | ICD-10-CM | POA: Diagnosis not present

## 2021-07-11 DIAGNOSIS — N179 Acute kidney failure, unspecified: Secondary | ICD-10-CM | POA: Diagnosis not present

## 2021-07-11 DIAGNOSIS — K631 Perforation of intestine (nontraumatic): Secondary | ICD-10-CM | POA: Diagnosis not present

## 2021-07-11 LAB — HEMOGLOBIN AND HEMATOCRIT, BLOOD
HCT: 26.3 % — ABNORMAL LOW (ref 36.0–46.0)
Hemoglobin: 8.6 g/dL — ABNORMAL LOW (ref 12.0–15.0)

## 2021-07-11 LAB — GLUCOSE, CAPILLARY
Glucose-Capillary: 186 mg/dL — ABNORMAL HIGH (ref 70–99)
Glucose-Capillary: 185 mg/dL — ABNORMAL HIGH (ref 70–99)
Glucose-Capillary: 148 mg/dL — ABNORMAL HIGH (ref 70–99)
Glucose-Capillary: 187 mg/dL — ABNORMAL HIGH (ref 70–99)
Glucose-Capillary: 221 mg/dL — ABNORMAL HIGH (ref 70–99)
Glucose-Capillary: 225 mg/dL — ABNORMAL HIGH (ref 70–99)

## 2021-07-11 LAB — PHOSPHORUS
Phosphorus: 3.3 mg/dL (ref 2.5–4.6)
Phosphorus: 3.2 mg/dL (ref 2.5–4.6)

## 2021-07-11 LAB — BASIC METABOLIC PANEL
Anion gap: 8 (ref 5–15)
Anion gap: 10 (ref 5–15)
BUN: 79 mg/dL — ABNORMAL HIGH (ref 8–23)
BUN: 81 mg/dL — ABNORMAL HIGH (ref 8–23)
CO2: 26 mmol/L (ref 22–32)
CO2: 24 mmol/L (ref 22–32)
Calcium: 7.8 mg/dL — ABNORMAL LOW (ref 8.9–10.3)
Calcium: 8.3 mg/dL — ABNORMAL LOW (ref 8.9–10.3)
Chloride: 107 mmol/L (ref 98–111)
Chloride: 106 mmol/L (ref 98–111)
Creatinine, Ser: 2.12 mg/dL — ABNORMAL HIGH (ref 0.44–1.00)
Creatinine, Ser: 2.17 mg/dL — ABNORMAL HIGH (ref 0.44–1.00)
GFR, Estimated: 24 mL/min — ABNORMAL LOW (ref >60)
GFR, Estimated: 23 mL/min — ABNORMAL LOW (ref >60)
Glucose, Bld: 183 mg/dL — ABNORMAL HIGH (ref 70–99)
Glucose, Bld: 197 mg/dL — ABNORMAL HIGH (ref 70–99)
Potassium: 2.9 mmol/L — ABNORMAL LOW (ref 3.5–5.1)
Potassium: 3.7 mmol/L (ref 3.5–5.1)
Sodium: 141 mmol/L (ref 135–145)
Sodium: 140 mmol/L (ref 135–145)

## 2021-07-11 LAB — CBC
HCT: 20 % — ABNORMAL LOW (ref 36.0–46.0)
Hemoglobin: 6.5 g/dL — ABNORMAL LOW (ref 12.0–15.0)
MCH: 28.4 pg (ref 26.0–34.0)
MCHC: 32.5 g/dL (ref 30.0–36.0)
MCV: 87.3 fL (ref 80.0–100.0)
Platelets: 143 10*3/uL — ABNORMAL LOW (ref 150–400)
RBC: 2.29 MIL/uL — ABNORMAL LOW (ref 3.87–5.11)
RDW: 15.3 % (ref 11.5–15.5)
WBC: 7.5 10*3/uL (ref 4.0–10.5)
nRBC: 0.3 % — ABNORMAL HIGH (ref 0.0–0.2)

## 2021-07-11 LAB — HEPARIN LEVEL (UNFRACTIONATED): Heparin Unfractionated: 0.18 IU/mL — ABNORMAL LOW (ref 0.30–0.70)

## 2021-07-11 LAB — PREPARE RBC (CROSSMATCH)

## 2021-07-11 LAB — MAGNESIUM
Magnesium: 1.7 mg/dL (ref 1.7–2.4)
Magnesium: 1.7 mg/dL (ref 1.7–2.4)

## 2021-07-11 MED ORDER — FREE WATER: 100.0000 mL | Status: DC

## 2021-07-11 MED ORDER — HYDRALAZINE HCL 20 MG/ML IJ SOLN
INTRAMUSCULAR | Status: AC
  Filled 2021-07-11: qty 1

## 2021-07-11 MED ORDER — RENA-VITE PO TABS
1.0000 | ORAL_TABLET | Freq: Every day | ORAL | Status: DC
  Administered 2021-07-11 – 2021-07-31 (×21): 1
  Filled 2021-07-11 (×21): qty 1

## 2021-07-11 MED ORDER — SODIUM CHLORIDE 0.9% IV SOLUTION: Freq: Once | INTRAVENOUS | Status: AC

## 2021-07-11 MED ORDER — POTASSIUM CHLORIDE 20 MEQ PO PACK
40.0000 meq | PACK | Freq: Once | ORAL | Status: AC
  Administered 2021-07-11: 40 meq
  Filled 2021-07-11: qty 2

## 2021-07-11 MED ORDER — POTASSIUM CHLORIDE 10 MEQ/50ML IV SOLN
10.0000 meq | INTRAVENOUS | Status: AC
  Administered 2021-07-11 (×2): 10 meq via INTRAVENOUS
  Filled 2021-07-11 (×2): qty 50

## 2021-07-11 MED ORDER — POTASSIUM CHLORIDE 10 MEQ/50ML IV SOLN: 10.0000 meq | INTRAVENOUS | Status: DC

## 2021-07-11 MED ORDER — PIVOT 1.5 CAL PO LIQD
1000.0000 mL | ORAL | Status: DC
  Administered 2021-07-11 – 2021-07-17 (×5): 1000 mL
  Filled 2021-07-11: qty 1000

## 2021-07-11 MED ORDER — HYDRALAZINE HCL 20 MG/ML IJ SOLN
10.0000 mg | INTRAMUSCULAR | Status: DC | PRN
  Administered 2021-07-11 – 2021-07-14 (×7): 10 mg via INTRAVENOUS
  Filled 2021-07-11 (×7): qty 1

## 2021-07-11 MED ORDER — FENTANYL CITRATE (PF) 100 MCG/2ML IJ SOLN
25.0000 ug | INTRAMUSCULAR | Status: DC | PRN
  Administered 2021-07-11 – 2021-07-12 (×3): 25 ug via INTRAVENOUS
  Filled 2021-07-11 (×3): qty 2

## 2021-07-11 MED ORDER — PROSOURCE TF PO LIQD
45.0000 mL | Freq: Every day | ORAL | Status: DC
  Administered 2021-07-12 – 2021-07-17 (×5): 45 mL
  Filled 2021-07-11 (×5): qty 45

## 2021-07-11 MED ORDER — FREE WATER
50.0000 mL | Status: DC
  Administered 2021-07-11 – 2021-07-15 (×21): 50 mL

## 2021-07-11 MED ORDER — HYDRALAZINE HCL 20 MG/ML IJ SOLN: 10.0000 mg | Freq: Four times a day (QID) | INTRAMUSCULAR | Status: DC | PRN

## 2021-07-11 MED ORDER — HYDRALAZINE HCL 20 MG/ML IJ SOLN
10.0000 mg | Freq: Four times a day (QID) | INTRAMUSCULAR | Status: DC | PRN
  Administered 2021-07-11: 10 mg via INTRAVENOUS
  Filled 2021-07-11: qty 1

## 2021-07-11 NOTE — Progress Notes (Signed)
Nutrition Follow-up  DOCUMENTATION CODES:   Obesity unspecified  INTERVENTION:   Increase Pivot 1.5 to 38m/hr + Pro-Source 483mdaily via tube  Free water flushes 5083m4 hours   Regimen provides 1840kcal/day, 124g/day protein and 1210m35my free water   Juven Fruit Punch BID, each serving provides 95kcal and 2.5g of protein (amino acids glutamine and arginine)  Rena-vit daily via tube   NUTRITION DIAGNOSIS:   Increased nutrient needs related to acute illness, post-op healing (sepsis, ARF on HD) as evidenced by estimated needs.  GOAL:   Provide needs based on ASPEN/SCCM guidelines -met with tube feeds   MONITOR:   Vent status, Labs, Weight trends, TF tolerance, Skin, I & O's  ASSESSMENT:   75 y83. year old female with PMH of diabetes, CKD III, nephrolithiasis with chronic right hydroureteronephrosis, HLD, depression/anxiety, IBS and gout who is admitted with PNA, ARF and perforated duodenal ulcer now s/p exploratory laparotomy and primary repair of perforated duodenal ulcer with creation of omental flap 8/5.  Pt remains sedated and ventilated. TPN discontinued. Pt tolerating tube feeds at goal rate. Refeed labs stable. Per chart, pt is weight stable since admit. Pt receiving HD prn; last treatment was yesterday. Pt failing weaning trials. Family meeting scheduled for tomorrow. Pt will need trach per MD if continued aggressive care is requested. Pt with significant edema; pt +6.3L on her I & Os.   Medications reviewed and include: insulin, protonix, juven, diflucan, fentanyl  Labs reviewed: Na 141 wnl, K 2.9(L), BUN 79(H), creat 2.12(H), P 3.3 wnl, Mg 1.7 wnl Hgb 6.5(L), Hct 20.0(L) Cbgs- 186, 185 x 24 hrs  Patient is currently intubated on ventilator support MV: 7.8 L/min Temp (24hrs), Avg:99 F (37.2 C), Min:98.1 F (36.7 C), Max:100 F (37.8 C)  Propofol: none   MAP- >65mm33m UOP- 2050ml 79mt Order:   Diet Order             Diet NPO time specified   Diet effective midnight                  EDUCATION NEEDS:   Not appropriate for education at this time  Skin:  Skin Assessment: Reviewed RN Assessment (Stage I sacrum, incision abdomen)  Last BM:  8/16- type 6  Height:   Ht Readings from Last 1 Encounters:  07/10/21 5' 2.01" (1.575 m)    Weight:   Wt Readings from Last 1 Encounters:  07/10/21 105.5 kg    Ideal Body Weight:  50 kg  BMI:  Body mass index is 42.53 kg/m.  Estimated Nutritional Needs:   Kcal:  1795kcal/day  Protein:  100-125 g/d  Fluid:  1.3-1.5L/d  Ohana Birdwell Koleen DistanceD, LDN Please refer to AMION [facility] and/or RD on-call/weekend/after hours pager

## 2021-07-11 NOTE — Progress Notes (Addendum)
Daily Progress Note   Patient Name: Heather Lopez       Date: 07/11/2021 DOB: 12-14-1945  Age: 75 y.o. MRN#: [id number] Attending Physician: Flora Lipps, MD Primary Care Physician: [name], PA-C Admit Date: 06/27/2021  Reason for Consultation/Follow-up: Establishing goals of care  Subjective: Patient is resting in bed on ventilator, as she was initally admitted for community aquired PNA with pleural effusions; she has been unable to wean. She is 11 days s/p exlap with primary repair of duodenal perforation with omental patch, and drain placement. She is 6 days s/p bilateral ureteral stent placement. She remains on anti fungals for candida found in renal urine samples acquired intraoperatively; left upper pole mass is smaller. Plans have been established for family meeting tomorrow between family and CCM to determine care moving forward. Will be important to determine what her wishes would be, and to discuss acceptable QOL moving forward in light of poor QOL 2/2 pain and poor mobility prior to this admission. Will continue to follow.    Length of Stay: 14  Current Medications: Scheduled Meds:   sodium chloride   Intravenous Once   chlorhexidine gluconate (MEDLINE KIT)  15 mL Mouth Rinse BID   Chlorhexidine Gluconate Cloth  6 each Topical Q0600   insulin aspart  0-20 Units Subcutaneous Q4H   insulin aspart  10 Units Subcutaneous Q4H   insulin detemir  43 Units Subcutaneous BID   mouth rinse  15 mL Mouth Rinse 10 times per day   metoprolol tartrate  5 mg Intravenous Q6H   multivitamin  15 mL Per Tube Daily   nutrition supplement (JUVEN)  1 packet Per Tube BID BM   pantoprazole (PROTONIX) IV  40 mg Intravenous Q12H    Continuous Infusions:  sodium chloride     sodium chloride 20 mL/hr  at 07/11/21 0800   dexmedetomidine (PRECEDEX) IV infusion 0.7 mcg/kg/hr (07/11/21 0338)   fentaNYL infusion INTRAVENOUS 150 mcg/hr (07/11/21 0800)   fluconazole (DIFLUCAN) IV Stopped (07/10/21 2226)   potassium chloride 10 mEq (07/11/21 0821)    PRN Meds: sodium chloride, acetaminophen, alteplase, fentaNYL, heparin, lidocaine (PF), lidocaine-prilocaine, midazolam, ondansetron (ZOFRAN) IV, pentafluoroprop-tetrafluoroeth  Physical Exam Constitutional:      Comments: On ventilator.             Vital Signs: BP 139/60  Pulse 75   Temp 99.3 F (37.4 C) (Oral)   Resp 19   Ht 5' 2.01" (1.575 m)   Wt 105.5 kg   SpO2 99%   BMI 42.53 kg/m  SpO2: SpO2: 99 % O2 Device: O2 Device: Ventilator O2 Flow Rate: O2 Flow Rate (L/min): 10 L/min  Intake/output summary:  Intake/Output Summary (Last 24 hours) at 07/11/2021 0911 Last data filed at 07/11/2021 0240 Gross per 24 hour  Intake 4125.17 ml  Output 3590 ml  Net 535.17 ml   LBM: Last BM Date: 07/11/21 Baseline Weight: Weight: 99.8 kg Most recent weight: Weight: 105.5 kg     Patient Active Problem List   Diagnosis Date Noted   Atrial flutter with rapid ventricular response ([place]) 07/07/2021   Endotracheally intubated 07/07/2021   Bilateral nephrolithiasis 07/07/2021   Anemia of chronic disease 07/07/2021   Pressure injury of skin 06/30/2021   Pneumoperitoneum    Acute respiratory failure with hypoxia ([facility]) 06/27/2021   Severe sepsis with septic shock ([place]) 06/27/2021   Acute lower UTI 06/27/2021   Acute kidney injury superimposed on chronic kidney disease ([place]) 06/27/2021   Non-proliferative diabetic retinopathy, mild, both eyes ([place]) 03/03/2018   Vitamin D deficiency 08/21/2016   IBS (irritable bowel syndrome) 08/21/2016   Special screening for malignant neoplasms, colon    Benign neoplasm of transverse colon    Benign neoplasm of sigmoid colon    Uncontrolled type 2 diabetes mellitus with insulin therapy ([place]) 07/12/2015    Benign hypertension with CKD (chronic kidney disease) stage III ([place]) 07/12/2015   Hyperlipidemia associated with type 2 diabetes mellitus ([place]) 07/12/2015   Depression 07/12/2015   BMI 40.0-44.9, adult ([place]) 07/12/2015    Palliative Care Assessment & Plan     Recommendations/Plan: Family meeting between CCM and family tomorrow.  PMT will follow for needs.    Code Status:    Code Status Orders  (From admission, onward)           Start     Ordered   06/27/21 0445  Full code  Continuous        06/27/21 0445           Code Status History     This patient has a current code status but no historical code status.      Advance Directive Documentation    Flowsheet Row Most Recent Value  Type of Advance Directive Healthcare Power of Attorney  Pre-existing out of facility DNR order (yellow form or pink MOST form) --  "MOST" Form in Place? --       Prognosis:  Poor    Care plan was discussed with CCM  Thank you for allowing the Palliative Medicine Team to assist in the care of this patient.       Total Time 15 min Prolonged Time Billed  no       Greater than 50%  of this time was spent counseling and coordinating care related to the above assessment and plan.  Asencion Gowda, NP  Please contact Palliative Medicine Team phone at [phone] for questions and concerns.

## 2021-07-11 NOTE — Progress Notes (Signed)
GOALS OF CARE DISCUSSION  The Clinical status was relayed to family in detail. Husband At bedside  Updated and notified of patients medical condition.    Patient remains unresponsive and will not open eyes to command.   Patient is having a weak cough and struggling to remove secretions.   Patient with increased WOB and using accessory muscles to breathe Explained to family course of therapy and the modalities    Patient with Progressive multiorgan failure with a very high probablity of a very minimal chance of meaningful recovery despite all aggressive and optimal medical therapy.  PATIENT REMAINS FULL CODE  Family understands the situation. We will plan to address [place] tomorrow when rest of family arrives  Family are satisfied with Plan of action and management. All questions answered  Additional CC time 20 mins   Heather Lopez Patricia Pesa, M.D.  [facility]  Medical Director [place] Director [facility] Cardio-Pulmonary Department

## 2021-07-11 NOTE — Progress Notes (Signed)
NAME:  Heather Lopez, MRN:  [id number], DOB:  10/23/1946, LOS: 74 ADMISSION DATE:  06/27/2021, CONSULTATION DATE:  06/30/21 REFERRING MD:  Hampton Abbot, MD  CHIEF COMPLAINT:  Abdominal Pain   HPI  75 y.o with significant PMH as below who presented to the ED on 06/27/21 with chief complaints of progressive shortness of breath, productive cough, loss of appetite, nausea, dry heaving fatigue, malaise and generalized body aches. Patient initially went to her PCP on 7/29 with complaints of rhinorrhea, harsh cough, loss of appetite, nausea, fatigue, malaise and generalized body aches x2 days, patient was diagnosed with pneumonia of which she received ceftriaxone 1 mg IM and sent home with Levaquin, prednisone and Tessalon.   Past Medical History  Diabetes mellitus type 2 Anxiety and depression Hypertension Hyperlipidemia Hydronephrosis and recurrent UTI Gout and osteoarthritis Significant Hospital Events   8/2: Admitted to MedSurg unit with sepsis 8/3: Vascular consulted for HD catheter 8/4: Patient developed acute abdomen.  CT abdomen shows viscus perforation.  Patient taken to the OR urgently for exploratory lap.  Patient return to ICU on vent.  PCCM consulted 07/01/21- patient had SBT today for recruitment and chest physiotherapy.  Still has significant tachycardia and hypertension when awake (possible pain related) but mentation is good able to follow verbal communication.  07/02/21-patient has failed SBT today due to hypoxemia.  Reviewed care plan with surgery team.  07/03/21- Lung V/Q scan obtained ~ negative for PE.  Weaning FiO2, currently at 50% 8/9 severe resp failure, remains on vent 8/10 s/p Cystoscopy with Bilateral ureteral stent placement (55F/24 cm) for LEFT OBSTRUCTED KIDNEY STONE 8/11 remains on VENT 8/11 failed SAT/SBT  8/12 started on insulin drip for FSBS >400, developed A. fib with RVR CT abdomen pelvis showed decreasing size of left suprarenal collection 8/13 transitioned off insulin, rate  relatively controlled 8/14 issues with increased heart rate when Cardizem decreased.  Currently at 10 mg/h plus Lopressor IV every 6H, consider transitioning Cardizem to via OG in a.m. 8/15 failing weaning trials 8/16 failed weaning trials discussed with family-needs [facility]   Consults:  Nephrology Vascular PCCM General Surgery Urology  Procedures:  8/5: Exploratory Lap  8/10: Cystoscopy, bilateral ureteral stent placement, bilateral retrograde pyelography   Significant Diagnostic Tests:  8/2: Chest Xray>left basilar consolidation or atelectasis and suspected small left pleural effusion 8/2: Renal ultrasound> moderate right hydronephrosis, mild left hydronephrosis new with bilateral nephrolithiasis 8/4: CTA abdomen and pelvis> Extensive pneumoperitoneum compatible with perforated viscus. Gastric perforation is suspected, though bowel wall defect is not definitively identified. Surgical consultation is recommended. 2. Large ill-defined mass arising from the upper pole left kidney consistent with renal cell carcinoma. Dedicated renal MRI may be useful when clinical situation permits. 3. Acute left-sided hydronephrosis due to a 12 mm left UPJ calculus. 4. Chronic right hydronephrosis and hydroureter without clear etiology. 5. Bilateral nonobstructing renal calculi as above. 6. Small volume ascites. 7. Trace bilateral pleural effusions with patchy bibasilar atelectasis. 8/7: Venous US BLE>> negative for DVT 8/8: Lung V/Q Scan>>Negative for pulmonary embolus.  Micro Data:  8/2: SARS-CoV-2 PCR> negative 8/2: Influenza PCR> negative 8/2: Blood culture x2> no growth 8/2: Urine Culture> 30,000 colonies yeast 8/2: Legionella urinary antigen> negative 8/10: 70,000 colonies yeast, ID pend  Antimicrobials:  8/4 Fluconazole> increased to high dose 8/12 8/4 Zosyn>   HPI/INTERVAL CHANGES Remains on vent Critically ill Multiorgan failure  Failure to wean from vent  REVIEW OF  SYSTEMS  PATIENT IS UNABLE TO PROVIDE COMPLETE REVIEW OF SYSTEMS DUE  TO SEVERE CRITICAL ILLNESS AND TOXIC METABOLIC ENCEPHALOPATHY    PHYSICAL EXAMINATION:  GENERAL:critically ill appearing, +resp distress EYES: Pupils equal, round, reactive to light.  No scleral icterus.  MOUTH: Moist mucosal membrane. INTUBATED NECK: Supple.  PULMONARY: +rhonchi, +wheezing CARDIOVASCULAR: S1 and S2.  No murmurs  GASTROINTESTINAL: Distended, Midline incision with dressing dry and intact. JP Drains x 2 MUSCULOSKELETAL: +edema.  NEUROLOGIC: obtunded SKIN:intact,warm,dry       OBJECTIVE  Blood pressure (!) 121/49, pulse 74, temperature 98.9 F (37.2 C), temperature source Oral, resp. rate 18, height 5' 2.01" (1.575 m), weight 105.5 kg, SpO2 98 %.    Vent Mode: PRVC FiO2 (%):  [30 %] 30 % Set Rate:  [12 bmp] 12 bmp Vt Set:  [480 mL] 480 mL PEEP:  [5 cmH20-8 cmH20] 8 cmH20 Plateau Pressure:  [14 cmH20-20 cmH20] 18 cmH20   Intake/Output Summary (Last 24 hours) at 07/11/2021 0718 Last data filed at 07/11/2021 0600 Gross per 24 hour  Intake 4308.18 ml  Output 2760 ml  Net 1548.18 ml    Filed Weights   07/08/21 0500 07/09/21 0500 07/10/21 0355  Weight: 106 kg 108 kg 105.5 kg      Labs   CBC: Recent Labs  Lab 07/05/21 0443 07/05/21 1244 07/06/21 0440 07/07/21 0545 07/08/21 0440 07/09/21 0430 07/10/21 0503 07/11/21 0453  WBC 20.6*  --  14.8* 14.2* 12.1* 12.8* 10.5 7.5  NEUTROABS 17.3*  --  13.5*  --   --   --   --   --   HGB 6.8*   < > 7.5* 8.0* 7.6* 7.7* 7.1* 6.5*  HCT 21.8*   < > 24.3* 26.7* 24.4* 24.4* 22.3* 20.0*  MCV 87.6  --  91.0 91.4 88.7 87.5 86.4 87.3  PLT 161  --  196 252 210 203 171 143*   < > = values in this interval not displayed.     Basic Metabolic Panel: Recent Labs  Lab 07/07/21 1945 07/08/21 0440 07/09/21 0430 07/10/21 0503 07/11/21 0453  NA 137 139 137 141 141  K 4.3 4.0 4.1 3.3* 2.9*  CL 102 104 108 112* 107  CO2 _0 18* 26  GLUCOSE  412* 226* 394* 256* 183*  BUN 90* 98* 104* 131* 79*  CREATININE 2.37* 2.87* 3.54* 3.35* 2.12*  CALCIUM 7.8* 8.1* 7.6* 7.8* 7.8*  MG 1.8 2.2 2.0 2.0 1.7  PHOS 3.0 3.9 3.6 3.9 3.3    GFR: Estimated Creatinine Clearance: 26.6 mL/min (A) (by C-G formula based on SCr of 2.12 mg/dL (H)). Recent Labs  Lab 07/08/21 0440 07/09/21 0430 07/10/21 0503 07/11/21 0453  WBC 12.1* 12.8* 10.5 7.5     Liver Function Tests: Recent Labs  Lab 07/06/21 0440  AST 42*  ALT 46*  ALKPHOS 83  BILITOT 0.3  PROT 5.9*  ALBUMIN 1.5*    ABG    Component Value Date/Time   PHART 7.25 (L) 07/06/2021 0951   PCO2ART 46 07/06/2021 0951   PO2ART 57 (L) 07/06/2021 0951   HCO3 20.2 07/06/2021 0951   ACIDBASEDEF 6.7 (H) 07/06/2021 0951   O2SAT 83.9 07/06/2021 0951      Allergies Allergies  Allergen Reactions   Contrast Media  [Iodinated Diagnostic Agents] Anaphylaxis   Iodine Swelling    (IV only) - angioedema    Scheduled Meds:  sodium chloride   Intravenous Once   chlorhexidine gluconate (MEDLINE KIT)  15 mL Mouth Rinse BID   Chlorhexidine Gluconate Cloth  6 each Topical Q0600  feeding supplement (PIVOT 1.5 CAL)  1,000 mL Per Tube Q24H   feeding supplement (PROSource TF)  45 mL Per Tube TID   free water  100 mL Per Tube Q3H   insulin aspart  0-20 Units Subcutaneous Q4H   insulin aspart  10 Units Subcutaneous Q4H   insulin detemir  43 Units Subcutaneous BID   mouth rinse  15 mL Mouth Rinse 10 times per day   metoprolol tartrate  5 mg Intravenous Q6H   multivitamin  15 mL Per Tube Daily   nutrition supplement (JUVEN)  1 packet Per Tube BID BM   pantoprazole (PROTONIX) IV  40 mg Intravenous Q12H   Continuous Infusions:  sodium chloride     sodium chloride 20 mL/hr at 07/11/21 0600   dexmedetomidine (PRECEDEX) IV infusion 0.7 mcg/kg/hr (07/11/21 0338)   fentaNYL infusion INTRAVENOUS 150 mcg/hr (07/11/21 0600)   fluconazole (DIFLUCAN) IV Stopped (07/10/21 2226)   potassium chloride 10 mEq  (07/11/21 0637)   PRN Meds:.sodium chloride, acetaminophen, alteplase, fentaNYL, heparin, lidocaine (PF), lidocaine-prilocaine, midazolam, ondansetron (ZOFRAN) IV, pentafluoroprop-tetrafluoroeth  Active Hospital Problem list   Acute hypoxic respiratory failure ventilator dependent Severe sepsis with septic shock Bowel perforation AKI on CKD stage IV Right UPJ stone with hydronephrosis Left renal fluid collection Anion gap metabolic acidosis Lactic acidosis Diabetes mellitus A. fib flutter with RVR Candiduria, yeast species being IDed  Assessment & Plan:   Acute Hypoxic Respiratory Failure in setting of Community Acquired Pneumonia,  bilateral pleural effusions with associated atelectasis with perforated Duodenal Ulcer with peritonitis Status post laparotomy Status post ureteral stent placement Left renal abscess, appears to be resolving Candiduria failure to wean from vent -S/p exploratory laparotomy and primary repair of duodenal ulcer with omental patch on 06/29/21 -S/P bilateral ureteral stent placement 8/10  Severe ACUTE Hypoxic and Hypercapnic Respiratory Failure -continue Mechanical Ventilator support -continue Bronchodilator Therapy -Wean Fio2 and PEEP as tolerated -VAP/VENT bundle implementation Unable to wean from vent Will likely need trach -Continue Zosyn (previously on Ceftriaxone and Azithromycin)    SEPTIC shock/Severe sepsis SOURCE-Severe Sepsis secondary to UTI, Community Acquired Pneumonia and perforated Duodenal Ulcer with peritonitis Status post laparotomy Status post ureteral stent placement Left renal abscess, appears to be resolving Candiduria -use vasopressors to keep MAP>65 as needed -follow ABG and LA as needed -follow up cultures -emperic ABX  ACUTE CARDIAC FAILURE- Afib with RVR On heparin infusion Continue Cardizem infusion at 10 mg/h Continue metoprolol IV currently 5 mg IV every 6 Avoiding amiodarone due to need for high dose fluconazole  (QT prolongation effects)  ACUTE KIDNEY INJURY/Renal Failure -continue Foley Catheter-assess need -Avoid nephrotoxic agents -Follow urine output, BMP -Ensure adequate renal perfusion, optimize oxygenation -Renal dose medications   Intake/Output Summary (Last 24 hours) at 07/11/2021 0731 Last data filed at 07/11/2021 0600 Gross per 24 hour  Intake 4308.18 ml  Output 2760 ml  Net 1548.18 ml     ACUTE ANEMIA- TRANSFUSE AS NEEDED CONSIDER TRANSFUSION  IF HGB<7 DVT PRX with TED/SCD's ONLY   ENDO - ICU hypoglycemic\Hyperglycemia protocol -check FSBS per protocol  ELECTROLYTES -follow labs as needed -replace as needed -pharmacy consultation and following    Best practice:  Diet:  NPO, TPN Pain/Anxiety/Delirium protocol (if indicated): Yes (RASS goal 0) VAP protocol (if indicated): Yes DVT prophylaxis: Heparin gtt GI prophylaxis: PPI Glucose control:  SSI Yes Central venous access:  Yes, and it is still needed Arterial line:  Yes, and it is still needed Foley:  Yes, and it is still needed  Mobility:  bed rest  PT consulted: N/A Last date of multidisciplinary goals of care discussion [8/8] Code Status:  full code Disposition: ICU    DVT/GI PRX  assessed I Assessed the need for Labs I Assessed the need for Foley I Assessed the need for Central Venous Line Family Discussion when available I Assessed the need for Mobilization I made an Assessment of medications to be adjusted accordingly Safety Risk assessment completed  CASE DISCUSSED IN MULTIDISCIPLINARY ROUNDS WITH ICU TEAM     Critical Care Time devoted to patient care services described in this note is 55 minutes.  Critical care was necessary to treat /prevent imminent and life-threatening deterioration. Overall, patient is critically ill, prognosis is guarded.  Patient with Multiorgan failure and at high risk for cardiac arrest and death.    Corrin Parker, M.D.  [facility]  [facility]  Medical Director [place] Director [facility] Cardio-Pulmonary Department

## 2021-07-11 NOTE — Progress Notes (Addendum)
[facility]  ROUNDING NOTE   Subjective:   Heather Lopez is a 75 y.o. white female who has been admitted for community acquired pneumonia.    Hospital course complicated by duodenal perforation s/p repair on 06/30/2021 ARF - requiring HD PRN Acute resp failure requiring mech ventilation Left obstructive hydronephrosis and renal abscess, Patient underwent cystoscopy and b/l ureteral stent placement 8/10  Critically ill Intubated and sedated with vent assistance Receiving tube feeds at 76m/hr Foley with clear yellow urine, 2L out in last 24 hours BUN 79 today   Objective:  Vital signs in last 24 hours:  Temp:  [98.1 F (36.7 C)-100 F (37.8 C)] 99.3 F (37.4 C) (08/16 0900) Pulse Rate:  [55-102] 75 (08/16 0900) Resp:  [16-28] 19 (08/16 0900) BP: (95-159)/(46-77) 139/60 (08/16 0900) SpO2:  [95 %-100 %] 99 % (08/16 0900) Arterial Line BP: (105-170)/(37-87) 156/65 (08/16 0900) FiO2 (%):  [30 %] 30 % (08/16 0900)  Weight change:  Filed Weights   07/08/21 0500 07/09/21 0500 07/10/21 0355  Weight: 106 kg 108 kg 105.5 kg    Intake/Output: I/O last 3 completed shifts: In: 6229.7 [I.V.:2967; Other:260; NKG/OV:7034.0 IV Piggyback:418.6] Out: 43524[Urine:3550; Drains:345; Stool:502]   Intake/Output this shift:  Total I/O In: 66.1 [I.V.:34.9; IV Piggyback:31.2] Out: 830 [Urine:250; Drains:80; Stool:500]  Physical Exam: General: Critically ill  Head: ETT, OGT  Eyes: Anicteric  Lungs:  Vent assisted  Heart: Regular rate and rhythm  Abdomen:  +JP drains  Extremities:  trace peripheral edema.  Neurologic:  sedated  Skin: No lesions  Access:  RIJ temp cath  Left IJ PermCath-nonfunctional  Basic Metabolic Panel: Recent Labs  Lab 07/07/21 1945 07/08/21 0440 07/09/21 0430 07/10/21 0503 07/11/21 0453  NA 137 139 137 141 141  K 4.3 4.0 4.1 3.3* 2.9*  CL 102 104 108 112* 107  CO2 '25 27 22 ' 18* 26  GLUCOSE 412* 226* 394* 256* 183*  BUN 90* 98* 104* 131* 79*   CREATININE 2.37* 2.87* 3.54* 3.35* 2.12*  CALCIUM 7.8* 8.1* 7.6* 7.8* 7.8*  MG 1.8 2.2 2.0 2.0 1.7  PHOS 3.0 3.9 3.6 3.9 3.3     Liver Function Tests: Recent Labs  Lab 07/06/21 0440  AST 42*  ALT 46*  ALKPHOS 83  BILITOT 0.3  PROT 5.9*  ALBUMIN 1.5*    No results for input(s): LIPASE, AMYLASE in the last 168 hours. No results for input(s): AMMONIA in the last 168 hours.  CBC: Recent Labs  Lab 07/05/21 0443 07/05/21 1244 07/06/21 0440 07/07/21 0545 07/08/21 0440 07/09/21 0430 07/10/21 0503 07/11/21 0453  WBC 20.6*  --  14.8* 14.2* 12.1* 12.8* 10.5 7.5  NEUTROABS 17.3*  --  13.5*  --   --   --   --   --   HGB 6.8*   < > 7.5* 8.0* 7.6* 7.7* 7.1* 6.5*  HCT 21.8*   < > 24.3* 26.7* 24.4* 24.4* 22.3* 20.0*  MCV 87.6  --  91.0 91.4 88.7 87.5 86.4 87.3  PLT 161  --  196 252 210 203 171 143*   < > = values in this interval not displayed.     Cardiac Enzymes: No results for input(s): CKTOTAL, CKMB, CKMBINDEX, TROPONINI in the last 168 hours.  BNP: Invalid input(s): POCBNP  CBG: Recent Labs  Lab 07/10/21 1552 07/10/21 1911 07/10/21 2315 07/11/21 0312 07/11/21 0825  GLUCAP 126* 153* 162* 186* 185*     Microbiology: Results for orders placed or performed during the hospital  encounter of 06/27/21  Resp Panel by RT-PCR (Flu A&B, Covid) Nasopharyngeal Swab     Status: None   Collection Time: 06/27/21 12:32 AM   Specimen: Nasopharyngeal Swab; Nasopharyngeal(NP) swabs in vial transport medium  Result Value Ref Range Status   SARS Coronavirus 2 by RT PCR NEGATIVE NEGATIVE Final    Comment: (NOTE) SARS-CoV-2 target nucleic acids are NOT DETECTED.  The SARS-CoV-2 RNA is generally detectable in upper respiratory specimens during the acute phase of infection. The lowest concentration of SARS-CoV-2 viral copies this assay can detect is 138 copies/mL. A negative result does not preclude SARS-Cov-2 infection and should not be used as the sole basis for treatment  or other patient management decisions. A negative result may occur with  improper specimen collection/handling, submission of specimen other than nasopharyngeal swab, presence of viral mutation(s) within the areas targeted by this assay, and inadequate number of viral copies(<138 copies/mL). A negative result must be combined with clinical observations, patient history, and epidemiological information. The expected result is Negative.  Fact Sheet for Patients:  EntrepreneurPulse.com.au  Fact Sheet for Healthcare Providers:  IncredibleEmployment.be  This test is no t yet approved or cleared by the [place] FDA and  has been authorized for detection and/or diagnosis of SARS-CoV-2 by FDA under an Emergency Use Authorization (EUA). This EUA will remain  in effect (meaning this test can be used) for the duration of the COVID-19 declaration under Section 564(b)(1) of the Act, 21 U.S.C.section 360bbb-3(b)(1), unless the authorization is terminated  or revoked sooner.       Influenza A by PCR NEGATIVE NEGATIVE Final   Influenza B by PCR NEGATIVE NEGATIVE Final    Comment: (NOTE) The Xpert Xpress SARS-CoV-2/FLU/RSV plus assay is intended as an aid in the diagnosis of influenza from Nasopharyngeal swab specimens and should not be used as a sole basis for treatment. Nasal washings and aspirates are unacceptable for Xpert Xpress SARS-CoV-2/FLU/RSV testing.  Fact Sheet for Patients: EntrepreneurPulse.com.au  Fact Sheet for Healthcare Providers: IncredibleEmployment.be  This test is not yet approved or cleared by the [place] FDA and has been authorized for detection and/or diagnosis of SARS-CoV-2 by FDA under an Emergency Use Authorization (EUA). This EUA will remain in effect (meaning this test can be used) for the duration of the COVID-19 declaration under Section 564(b)(1) of the Act, 21 U.S.C. section  360bbb-3(b)(1), unless the authorization is terminated or revoked.  Performed at [facility], [address] [place], [place] [address]   Blood culture (routine x 2)     Status: None   Collection Time: 06/27/21 12:43 AM   Specimen: BLOOD  Result Value Ref Range Status   Specimen Description BLOOD RIGHT ASSIST CONTROL  Final   Special Requests   Final    BOTTLES DRAWN AEROBIC AND ANAEROBIC Blood Culture results may not be optimal due to an inadequate volume of blood received in culture bottles   Culture   Final    NO GROWTH 5 DAYS Performed at [facility], [address] [place], [place] [address]    Report Status 07/02/2021 FINAL  Final  Blood culture (routine x 2)     Status: None   Collection Time: 06/27/21  1:45 AM   Specimen: BLOOD  Result Value Ref Range Status   Specimen Description BLOOD RIGHT ASSIST CONTROL  Final   Special Requests   Final    BOTTLES DRAWN AEROBIC AND ANAEROBIC Blood Culture adequate volume   Culture   Final  NO GROWTH 5 DAYS Performed at [facility], [address] [place], [place] [address]    Report Status 07/02/2021 FINAL  Final  Urine Culture     Status: Abnormal   Collection Time: 06/27/21  2:39 AM   Specimen: In/Out Cath Urine  Result Value Ref Range Status   Specimen Description   Final    IN/OUT CATH URINE Performed at [facility], [address] [place], [place] [address]    Special Requests   Final    NONE Performed at [facility], [place], [place] [address]    Culture 30,000 COLONIES/mL YEAST (A)  Final   Report Status 06/28/2021 FINAL  Final  MRSA Next Gen by PCR, Nasal     Status: None   Collection Time: 06/30/21  5:01 AM   Specimen: Nasal Mucosa; Nasal Swab  Result Value Ref Range Status   MRSA by PCR Next Gen NOT DETECTED NOT DETECTED Final    Comment: (NOTE) The GeneXpert MRSA Assay (FDA approved for NASAL specimens only), is one component of a  comprehensive MRSA colonization surveillance program. It is not intended to diagnose MRSA infection nor to guide or monitor treatment for MRSA infections. Test performance is not FDA approved in patients less than 42 years old. Performed at [facility], [address], [place], [place] [address]   Urine Culture     Status: Abnormal   Collection Time: 07/05/21 10:36 AM   Specimen: PATH Other; Urine  Result Value Ref Range Status   Specimen Description   Final    URINE, RANDOM LEFT RENAL PELVIS URINE Performed at [facility], [address] [place], [place] [address]    Special Requests   Final    NONE Performed at [facility], [address] [place], [place] [address]    Culture 70,000 COLONIES/mL YEAST (A)  Final   Report Status 07/06/2021 FINAL  Final  Urine Culture     Status: Abnormal   Collection Time: 07/05/21 10:48 AM   Specimen: PATH Other; Urine  Result Value Ref Range Status   Specimen Description   Final    URINE, RANDOM RIGHT KIDNEY URINE Performed at [facility], [address], [place], [place] [address]    Special Requests   Final    NONE Performed at [facility], [address] [place], [place] [address]    Culture >=100,000 COLONIES/mL YEAST (A)  Final   Report Status 07/06/2021 FINAL  Final    Coagulation Studies: No results for input(s): LABPROT, INR in the last 72 hours.   Urinalysis: No results for input(s): COLORURINE, LABSPEC, PHURINE, GLUCOSEU, HGBUR, BILIRUBINUR, KETONESUR, PROTEINUR, UROBILINOGEN, NITRITE, LEUKOCYTESUR in the last 72 hours.  Invalid input(s): APPERANCEUR     Imaging: DG Chest Port 1 View  Result Date: 07/10/2021 CLINICAL DATA:  Shortness of breath EXAM: PORTABLE CHEST 1 VIEW COMPARISON:  Portable exam 0517 hours compared to 07/04/2021 FINDINGS: Tip of endotracheal tube projects 2.4 cm above carina. Nasogastric tube extends into stomach. RIGHT jugular central venous  catheter with tip projecting over SVC near cavoatrial junction. Dual lumen LEFT jugular central venous catheter with tips projecting over RIGHT atrium. Kink in LEFT jugular catheter in the LEFT cervical region. Enlargement of cardiac silhouette with vascular congestion. Scattered atelectasis in the mid to lower lungs. Minimal perihilar infiltrate question edema. No definite pleural effusion or pneumothorax. IMPRESSION: Scattered atelectasis and question minimal perihilar edema. Electronically Signed   By: Elta Guadeloupe  Thornton Papas M.D.   On: 07/10/2021 08:19     Medications:    sodium chloride     sodium chloride 20 mL/hr at 07/11/21 0800   dexmedetomidine (PRECEDEX) IV infusion 0.7 mcg/kg/hr (07/11/21 0338)   fentaNYL infusion INTRAVENOUS 150 mcg/hr (07/11/21 0800)   fluconazole (DIFLUCAN) IV Stopped (07/10/21 2226)   potassium chloride 10 mEq (07/11/21 0821)    sodium chloride   Intravenous Once   chlorhexidine gluconate (MEDLINE KIT)  15 mL Mouth Rinse BID   Chlorhexidine Gluconate Cloth  6 each Topical Q0600   insulin aspart  0-20 Units Subcutaneous Q4H   insulin aspart  10 Units Subcutaneous Q4H   insulin detemir  43 Units Subcutaneous BID   mouth rinse  15 mL Mouth Rinse 10 times per day   metoprolol tartrate  5 mg Intravenous Q6H   multivitamin  15 mL Per Tube Daily   nutrition supplement (JUVEN)  1 packet Per Tube BID BM   pantoprazole (PROTONIX) IV  40 mg Intravenous Q12H     Assessment/ Plan:  Ms. SKYELER SCALESE is a 75 y.o.  female who has been admitted for community acquired pneumonia.    Acute Kidney Injury on chronic kidney disease stage 4 with baseline creatinine 1.97 and GFR of 25 on 12/22/19.  Acute kidney injury secondary to sepsis and obstructive uropathy Chronic kidney disease is secondary to diabetic nephropathy We will continue to hold losartan and metformin Creatinine improved, UOP adequate BUN improved 79 Successful dialysis yesterday Continue supportive care Avoid  nephrotoxins  Lab Results  Component Value Date   CREATININE 2.12 (H) 07/11/2021   CREATININE 3.35 (H) 07/10/2021   CREATININE 3.54 (H) 07/09/2021    Intake/Output Summary (Last 24 hours) at 07/11/2021 0907 Last data filed at 07/11/2021 0843 Gross per 24 hour  Intake 4125.17 ml  Output 3590 ml  Net 535.17 ml    2. Acute Respiratory failure secondary to community acquired pneumonia failing outpatient antibiotics.  Mechanical ventilation   3. Anemia of chronic kidney disease Normocytic Lab Results  Component Value Date   HGB 6.5 (L) 07/11/2021   Low threshold to initiate ESA EPO with this treatment Hgb below target, 1 unit transfused  4.  Diabetes mellitus type II with chronic kidney disease insulin dependent. Most recent hemoglobin A1c is 8.3 on 06/12/21.  Metformin held, Glucose stable   5.Bilateral Nephrolithiasis- with obstructive uropathy and left renal abscess Right - Mild to moderate chronic hydronephrosis without obstructing stone Left-mild left hydronephrosis with 14 mm stone at left UPJ- b/l ureteral stent placed 8/10 Lesion at upper pole of left kidney enlarged - likely hematoma or abscess  6. S/p  duodenal perforation s/p repair on 06/30/2021  TPN weaned off Tube feeds @ 45m/hr  7. Hypernatremia - free water deficit - increase free water supplement - 200 q 3 hrs Stable      LOS: 14   8/16/20229:07 AM

## 2021-07-11 NOTE — Consult Note (Signed)
ANTICOAGULATION CONSULT NOTE  Pharmacy Consult for Heparin infusion Indication: afib  Patient Measurements: Heparin Dosing Weight: 75.6 kg   Labs: Recent Labs    07/09/21 0430 07/09/21 1516 07/09/21 2303 07/10/21 0503 07/11/21 0453  HGB 7.7*  --   --  7.1* 6.5*  HCT 24.4*  --   --  22.3* 20.0*  PLT 203  --   --  171 143*  HEPARINUNFRC 0.27*   < > 0.43 0.42 0.18*  CREATININE 3.54*  --   --  3.35* 2.12*   < > = values in this interval not displayed.     Estimated Creatinine Clearance: 26.6 mL/min (A) (by C-G formula based on SCr of 2.12 mg/dL (H)).   Medical History: Past Medical History:  Diagnosis Date   Anxiety    Cataract    Depression    Diabetes mellitus without complication ([place])    Gout    History of kidney stones    Hyperlipidemia    Hypertension    Vertigo     Assessment:  [name] admitted 8/2 with acute renal failure, acute respiratory failure secondary to pneumonia. Hospital course complicated by perforated duodenal ulcer and patient was emergently taken to OR 8/5 for ExLap, repair of perforated duodenal ulcer. Patient was previously on heparin drip for PE and was later switched to prophylactic SQH - last dose was 8/12 at 1304. Pharmacy has been consulted for initiation and management of heparin infusion for afib.  8/13 0947 HL 0.11, subtherapeutic 8/13 1808 HL 0.12, subtherapeutic; drawn ~1 hour early; confirmed with nurse no issues with heparin infusion/line 8/14 0430 HL 0.27, subtherapeutic 8/14 1516 HL 0.46, therapeutic,  8/14 2313 HL 0.43, therapeutic x 2 8/15 0503 HL 0.42, therapeutic x 3 8/16 0453 HL 0.18, subtherapeutic  Goal of Therapy:  Heparin level 0.3-0.7 units/ml Monitor platelets by anticoagulation protocol: Yes   Plan:  --Heparin level is subtherapeutic --H&H, platelets are down-trending --Patient sustaining in normal sinus rhythm --Discussed with provider, will hold IV heparin at this time --Continue to follow anticoagulation  plan  Benita Gutter  07/11/2021 5:45 AM

## 2021-07-11 NOTE — Progress Notes (Addendum)
ID Patient remains intubated On Precedex Getting NG feeds BP (!) 184/56   Pulse (!) 103   Temp (!) 100.7 F (38.2 C) (Oral)   Resp (!) 29   Ht 5' 2.01" (1.575 m)   Wt 105.5 kg   SpO2 100%   BMI 42.53 kg/m   Chest bilateral air entry Heart sound S1-S2 Abdomen.  Obese Surgical site well approximated 2 JP drains present Foley catheter  HD catheter.  Right IJ   Labs CBC Latest Ref Rng & Units 07/11/2021 07/11/2021 07/10/2021  WBC 4.0 - 10.5 K/uL - 7.5 10.5  Hemoglobin 12.0 - 15.0 g/dL 8.6(L) 6.5(L) 7.1(L)  Hematocrit 36.0 - 46.0 % 26.3(L) 20.0(L) 22.3(L)  Platelets 150 - 400 K/uL - 143(L) 171    CMP Latest Ref Rng & Units 07/11/2021 07/11/2021 07/10/2021  Glucose 70 - 99 mg/dL 197(H) 183(H) 256(H)  BUN 8 - 23 mg/dL 81(H) 79(H) 131(H)  Creatinine 0.44 - 1.00 mg/dL 2.17(H) 2.12(H) 3.35(H)  Sodium 135 - 145 mmol/L 140 141 141  Potassium 3.5 - 5.1 mmol/L 3.7 2.9(L) 3.3(L)  Chloride 98 - 111 mmol/L 106 107 112(H)  CO2 22 - 32 mmol/L 24 26 18(L)  Calcium 8.9 - 10.3 mg/dL 8.3(L) 7.8(L) 7.8(L)  Total Protein 6.5 - 8.1 g/dL - - -  Total Bilirubin 0.3 - 1.2 mg/dL - - -  Alkaline Phos 38 - 126 U/L - - -  AST 15 - 41 U/L - - -  ALT 0 - 44 U/L - - -     Impression/recommendation  Patient admitted with shortness of breath and was treated like pneumonia.  She then developed abdominal pain. Found to have air in abdomen Duodenal perforation: Surgery on 06/30/2021 with closure of the perforation with omental patch. Has 2 JP drains Completed Zosyn  AKI on hemodialysis  Bilateral nephrolithiasis Left hydronephrosis with UPJ obstruction.  Underwent stenting on 07/05/2021.  Hydronephrosis resolved.  Candida pyelonephritis bilateral.  urine culture positive.  Taken from the left and right kidney.  Currently on high-dose fluconazole. The left renal upper pole mass which was a hematoma versus abscess is much smaller now.  We will continue fluconazole until 07/16/2021 Right ureteral  stent.  Worsening anemia Received blood transfusion Heparin has been off switched off  A. fib  Was on diltiazem and heparin  Thrombocytopenia.  Leukocytosis has resolved  Discussed the management with the care team.

## 2021-07-12 ENCOUNTER — Inpatient Hospital Stay: Payer: Medicare Other

## 2021-07-12 DIAGNOSIS — J9601 Acute respiratory failure with hypoxia: Secondary | ICD-10-CM | POA: Diagnosis not present

## 2021-07-12 DIAGNOSIS — N390 Urinary tract infection, site not specified: Secondary | ICD-10-CM | POA: Diagnosis not present

## 2021-07-12 DIAGNOSIS — I4892 Unspecified atrial flutter: Secondary | ICD-10-CM | POA: Diagnosis not present

## 2021-07-12 DIAGNOSIS — N170 Acute kidney failure with tubular necrosis: Secondary | ICD-10-CM | POA: Diagnosis not present

## 2021-07-12 DIAGNOSIS — N189 Chronic kidney disease, unspecified: Secondary | ICD-10-CM | POA: Diagnosis not present

## 2021-07-12 DIAGNOSIS — B3749 Other urogenital candidiasis: Secondary | ICD-10-CM | POA: Diagnosis not present

## 2021-07-12 DIAGNOSIS — N179 Acute kidney failure, unspecified: Secondary | ICD-10-CM | POA: Diagnosis not present

## 2021-07-12 DIAGNOSIS — N200 Calculus of kidney: Secondary | ICD-10-CM | POA: Diagnosis not present

## 2021-07-12 DIAGNOSIS — N151 Renal and perinephric abscess: Secondary | ICD-10-CM | POA: Diagnosis not present

## 2021-07-12 LAB — BPAM RBC
Blood Product Expiration Date: 202208172359
ISSUE DATE / TIME: 202208160856
Unit Type and Rh: 5100

## 2021-07-12 LAB — CBC
HCT: 24.7 % — ABNORMAL LOW (ref 36.0–46.0)
Hemoglobin: 8.1 g/dL — ABNORMAL LOW (ref 12.0–15.0)
MCH: 27.7 pg (ref 26.0–34.0)
MCHC: 32.8 g/dL (ref 30.0–36.0)
MCV: 84.6 fL (ref 80.0–100.0)
Platelets: 162 10*3/uL (ref 150–400)
RBC: 2.92 MIL/uL — ABNORMAL LOW (ref 3.87–5.11)
RDW: 17.2 % — ABNORMAL HIGH (ref 11.5–15.5)
WBC: 7.5 10*3/uL (ref 4.0–10.5)
nRBC: 0.4 % — ABNORMAL HIGH (ref 0.0–0.2)

## 2021-07-12 LAB — BASIC METABOLIC PANEL
Anion gap: 10 (ref 5–15)
BUN: 84 mg/dL — ABNORMAL HIGH (ref 8–23)
CO2: 25 mmol/L (ref 22–32)
Calcium: 8.4 mg/dL — ABNORMAL LOW (ref 8.9–10.3)
Chloride: 107 mmol/L (ref 98–111)
Creatinine, Ser: 2.17 mg/dL — ABNORMAL HIGH (ref 0.44–1.00)
GFR, Estimated: 23 mL/min — ABNORMAL LOW (ref >60)
Glucose, Bld: 185 mg/dL — ABNORMAL HIGH (ref 70–99)
Potassium: 3.4 mmol/L — ABNORMAL LOW (ref 3.5–5.1)
Sodium: 142 mmol/L (ref 135–145)

## 2021-07-12 LAB — TYPE AND SCREEN
ABO/RH(D): O POS
Antibody Screen: NEGATIVE
Unit division: 0

## 2021-07-12 LAB — GLUCOSE, CAPILLARY
Glucose-Capillary: 173 mg/dL — ABNORMAL HIGH (ref 70–99)
Glucose-Capillary: 186 mg/dL — ABNORMAL HIGH (ref 70–99)
Glucose-Capillary: 206 mg/dL — ABNORMAL HIGH (ref 70–99)
Glucose-Capillary: 288 mg/dL — ABNORMAL HIGH (ref 70–99)
Glucose-Capillary: 325 mg/dL — ABNORMAL HIGH (ref 70–99)
Glucose-Capillary: 265 mg/dL — ABNORMAL HIGH (ref 70–99)

## 2021-07-12 LAB — MAGNESIUM: Magnesium: 1.7 mg/dL (ref 1.7–2.4)

## 2021-07-12 LAB — URINE CULTURE: Culture: 70000 — AB

## 2021-07-12 LAB — PHOSPHORUS: Phosphorus: 2.7 mg/dL (ref 2.5–4.6)

## 2021-07-12 MED ORDER — FENTANYL CITRATE (PF) 100 MCG/2ML IJ SOLN
100.0000 ug | Freq: Once | INTRAMUSCULAR | Status: AC
  Administered 2021-07-12: 100 ug via INTRAVENOUS
  Filled 2021-07-12: qty 2

## 2021-07-12 MED ORDER — AMLODIPINE BESYLATE 10 MG PO TABS
10.0000 mg | ORAL_TABLET | Freq: Every day | ORAL | Status: DC
  Administered 2021-07-12 – 2021-08-03 (×23): 10 mg
  Filled 2021-07-12 (×23): qty 1

## 2021-07-12 MED ORDER — VECURONIUM BROMIDE 10 MG IV SOLR
10.0000 mg | Freq: Once | INTRAVENOUS | Status: AC
  Administered 2021-07-12: 10 mg via INTRAVENOUS
  Filled 2021-07-12: qty 10

## 2021-07-12 MED ORDER — LORAZEPAM 2 MG/ML IJ SOLN
4.0000 mg | Freq: Once | INTRAMUSCULAR | Status: AC
  Administered 2021-07-12: 4 mg via INTRAVENOUS
  Filled 2021-07-12: qty 2

## 2021-07-12 MED ORDER — HEPARIN SODIUM (PORCINE) 5000 UNIT/ML IJ SOLN
5000.0000 [IU] | Freq: Three times a day (TID) | INTRAMUSCULAR | Status: DC
  Administered 2021-07-12 – 2021-07-25 (×40): 5000 [IU] via SUBCUTANEOUS
  Filled 2021-07-12 (×40): qty 1

## 2021-07-12 MED ORDER — METOPROLOL TARTRATE 25 MG PO TABS
25.0000 mg | ORAL_TABLET | Freq: Two times a day (BID) | ORAL | Status: DC
  Administered 2021-07-12 – 2021-07-17 (×11): 25 mg
  Filled 2021-07-12 (×11): qty 1

## 2021-07-12 NOTE — Progress Notes (Signed)
[facility]  ROUNDING NOTE   Subjective:   Heather Lopez is a 75 y.o. white female who has been admitted for community acquired pneumonia.    Hospital course complicated by duodenal perforation s/p repair on 06/30/2021 ARF - requiring HD PRN Acute resp failure requiring mech ventilation Left obstructive hydronephrosis and renal abscess, Patient underwent cystoscopy and b/l ureteral stent placement 8/10  Critically ill Intubated and sedated on ventilator, failed weaning trial yesterday  Tube feeds at 1m/hr Foley with clear yellow urine UOP 3.1L in past 24 hours BUN slightly increased today, 84   Objective:  Vital signs in last 24 hours:  Temp:  [98.4 F (36.9 C)-100.9 F (38.3 C)] 99.6 F (37.6 C) (08/17 0800) Pulse Rate:  [77-113] 100 (08/17 0800) Resp:  [21-33] 32 (08/17 0800) BP: (144-204)/(50-75) 178/59 (08/17 0800) SpO2:  [95 %-100 %] 100 % (08/17 0800) Arterial Line BP: (181)/(97) 181/97 (08/16 1100) FiO2 (%):  [30 %] 30 % (08/17 0800) Weight:  [107.1 kg] 107.1 kg (08/17 0436)  Weight change:  Filed Weights   07/09/21 0500 07/10/21 0355 07/12/21 0436  Weight: 108 kg 105.5 kg 107.1 kg    Intake/Output: I/O last 3 completed shifts: In: 4387.1 [I.V.:1668.3; Blood:413.3; Other:115; NG/GT:1690.8; IV Piggyback:499.7] Out: 55830[Urine:3900; Drains:470; SNMMHW:8088]  Intake/Output this shift:  Total I/O In: -  Out: 450 [Urine:450]  Physical Exam: General: Critically ill  Head: ETT, OGT  Eyes: Anicteric  Lungs:  Vent assisted  Heart: Regular rate and rhythm  Abdomen:  +JP drains  Extremities:  trace peripheral edema.dependent BUE  Neurologic:  sedated  Skin: No lesions  Access:  RIJ temp cath  Left IJ PermCath-nonfunctional  Basic Metabolic Panel: Recent Labs  Lab 07/09/21 0430 07/10/21 0503 07/11/21 0453 07/11/21 1510 07/12/21 0501  NA 137 141 141 140 142  K 4.1 3.3* 2.9* 3.7 3.4*  CL 108 112* 107 106 107  CO2 22 18* _0 GLUCOSE  394* 256* 183* 197* 185*  BUN 104* 131* 79* 81* 84*  CREATININE 3.54* 3.35* 2.12* 2.17* 2.17*  CALCIUM 7.6* 7.8* 7.8* 8.3* 8.4*  MG 2.0 2.0 1.7 1.7 1.7  PHOS 3.6 3.9 3.3 3.2 2.7     Liver Function Tests: Recent Labs  Lab 07/06/21 0440  AST 42*  ALT 46*  ALKPHOS 83  BILITOT 0.3  PROT 5.9*  ALBUMIN 1.5*    No results for input(s): LIPASE, AMYLASE in the last 168 hours. No results for input(s): AMMONIA in the last 168 hours.  CBC: Recent Labs  Lab 07/06/21 0440 07/07/21 0545 07/08/21 0440 07/09/21 0430 07/10/21 0503 07/11/21 0453 07/11/21 1510 07/12/21 0501  WBC 14.8*   < > 12.1* 12.8* 10.5 7.5  --  7.5  NEUTROABS 13.5*  --   --   --   --   --   --   --   HGB 7.5*   < > 7.6* 7.7* 7.1* 6.5* 8.6* 8.1*  HCT 24.3*   < > 24.4* 24.4* 22.3* 20.0* 26.3* 24.7*  MCV 91.0   < > 88.7 87.5 86.4 87.3  --  84.6  PLT 196   < > 210 203 171 143*  --  162   < > = values in this interval not displayed.     Cardiac Enzymes: No results for input(s): CKTOTAL, CKMB, CKMBINDEX, TROPONINI in the last 168 hours.  BNP: Invalid input(s): POCBNP  CBG: Recent Labs  Lab 07/11/21 1558 07/11/21 1930 07/11/21 2308 07/12/21 0321 07/12/21  [place]     Microbiology: Results for orders placed or performed during the hospital encounter of 06/27/21  Resp Panel by RT-PCR (Flu A&B, Covid) Nasopharyngeal Swab     Status: None   Collection Time: 06/27/21 12:32 AM   Specimen: Nasopharyngeal Swab; Nasopharyngeal(NP) swabs in vial transport medium  Result Value Ref Range Status   SARS Coronavirus 2 by RT PCR NEGATIVE NEGATIVE Final    Comment: (NOTE) SARS-CoV-2 target nucleic acids are NOT DETECTED.  The SARS-CoV-2 RNA is generally detectable in upper respiratory specimens during the acute phase of infection. The lowest concentration of SARS-CoV-2 viral copies this assay can detect is 138 copies/mL. A negative result does not preclude SARS-Cov-2 infection and  should not be used as the sole basis for treatment or other patient management decisions. A negative result may occur with  improper specimen collection/handling, submission of specimen other than nasopharyngeal swab, presence of viral mutation(s) within the areas targeted by this assay, and inadequate number of viral copies(<138 copies/mL). A negative result must be combined with clinical observations, patient history, and epidemiological information. The expected result is Negative.  Fact Sheet for Patients:  EntrepreneurPulse.com.au  Fact Sheet for Healthcare Providers:  IncredibleEmployment.be  This test is no t yet approved or cleared by the [place] FDA and  has been authorized for detection and/or diagnosis of SARS-CoV-2 by FDA under an Emergency Use Authorization (EUA). This EUA will remain  in effect (meaning this test can be used) for the duration of the COVID-19 declaration under Section 564(b)(1) of the Act, 21 U.S.C.section 360bbb-3(b)(1), unless the authorization is terminated  or revoked sooner.       Influenza A by PCR NEGATIVE NEGATIVE Final   Influenza B by PCR NEGATIVE NEGATIVE Final    Comment: (NOTE) The Xpert Xpress SARS-CoV-2/FLU/RSV plus assay is intended as an aid in the diagnosis of influenza from Nasopharyngeal swab specimens and should not be used as a sole basis for treatment. Nasal washings and aspirates are unacceptable for Xpert Xpress SARS-CoV-2/FLU/RSV testing.  Fact Sheet for Patients: EntrepreneurPulse.com.au  Fact Sheet for Healthcare Providers: IncredibleEmployment.be  This test is not yet approved or cleared by the [place] FDA and has been authorized for detection and/or diagnosis of SARS-CoV-2 by FDA under an Emergency Use Authorization (EUA). This EUA will remain in effect (meaning this test can be used) for the duration of the COVID-19 declaration  under Section 564(b)(1) of the Act, 21 U.S.C. section 360bbb-3(b)(1), unless the authorization is terminated or revoked.  Performed at [facility], [address] [place], [place] [address]   Blood culture (routine x 2)     Status: None   Collection Time: 06/27/21 12:43 AM   Specimen: BLOOD  Result Value Ref Range Status   Specimen Description BLOOD RIGHT ASSIST CONTROL  Final   Special Requests   Final    BOTTLES DRAWN AEROBIC AND ANAEROBIC Blood Culture results may not be optimal due to an inadequate volume of blood received in culture bottles   Culture   Final    NO GROWTH 5 DAYS Performed at [facility], [address], [place],  [address]    Report Status 07/02/2021 FINAL  Final  Blood culture (routine x 2)     Status: None   Collection Time: 06/27/21  1:45 AM   Specimen: BLOOD  Result Value Ref Range Status   Specimen Description BLOOD RIGHT ASSIST CONTROL  Final   Special Requests  Final    BOTTLES DRAWN AEROBIC AND ANAEROBIC Blood Culture adequate volume   Culture   Final    NO GROWTH 5 DAYS Performed at [facility], [address] [place], [place] [address]    Report Status 07/02/2021 FINAL  Final  Urine Culture     Status: Abnormal   Collection Time: 06/27/21  2:39 AM   Specimen: In/Out Cath Urine  Result Value Ref Range Status   Specimen Description   Final    IN/OUT CATH URINE Performed at [facility], [address] [place], [place] [address]    Special Requests   Final    NONE Performed at [facility], [place], [place] [address]    Culture 30,000 COLONIES/mL YEAST (A)  Final   Report Status 06/28/2021 FINAL  Final  MRSA Next Gen by PCR, Nasal     Status: None   Collection Time: 06/30/21  5:01 AM   Specimen: Nasal Mucosa; Nasal Swab  Result Value Ref Range Status   MRSA by PCR Next Gen NOT DETECTED NOT DETECTED Final    Comment: (NOTE) The GeneXpert MRSA Assay (FDA  approved for NASAL specimens only), is one component of a comprehensive MRSA colonization surveillance program. It is not intended to diagnose MRSA infection nor to guide or monitor treatment for MRSA infections. Test performance is not FDA approved in patients less than 57 years old. Performed at [facility], [address] [place], [place] [address]   Urine Culture     Status: Abnormal (Preliminary result)   Collection Time: 07/05/21 10:36 AM   Specimen: PATH Other; Urine  Result Value Ref Range Status   Specimen Description   Final    URINE, RANDOM LEFT RENAL PELVIS URINE Performed at [facility], [address], [place], [place] [address]    Special Requests   Final    NONE Performed at [facility], [address], [place], [place] [address]    Culture (A)  Final    70,000 COLONIES/mL YEAST CULTURE REINCUBATED FOR BETTER GROWTH Performed at [facility], [place] [address] [place], [place] [address]    Report Status PENDING  Incomplete  Urine Culture     Status: Abnormal (Preliminary result)   Collection Time: 07/05/21 10:48 AM   Specimen: PATH Other; Urine  Result Value Ref Range Status   Specimen Description   Final    URINE, RANDOM RIGHT KIDNEY URINE Performed at [facility], [address] [place], [place] [address]    Special Requests   Final    NONE Performed at [facility], [address], [place], [place] [address]    Culture (A)  Final    >=100,000 COLONIES/mL YEAST CULTURE REINCUBATED FOR BETTER GROWTH Performed at [facility], [place] [address] [place],  [address]    Report Status PENDING  Incomplete    Coagulation Studies: No results for input(s): LABPROT, INR in the last 72 hours.   Urinalysis: No results for input(s): COLORURINE, LABSPEC, PHURINE, GLUCOSEU, HGBUR, BILIRUBINUR, KETONESUR, PROTEINUR, UROBILINOGEN, NITRITE, LEUKOCYTESUR in the last 72 hours.  Invalid input(s):  APPERANCEUR     Imaging: No results found.   Medications:    sodium chloride     sodium chloride Stopped (07/11/21 0902)   dexmedetomidine (PRECEDEX) IV infusion 0.6 mcg/kg/hr (07/12/21 0600)   feeding supplement (PIVOT 1.5 CAL) 1,000 mL (07/12/21 0302)   fentaNYL infusion INTRAVENOUS Stopped (07/11/21 1141)   fluconazole (  DIFLUCAN) IV Stopped (07/11/21 2153)    chlorhexidine gluconate (MEDLINE KIT)  15 mL Mouth Rinse BID   Chlorhexidine Gluconate Cloth  6 each Topical Q0600   feeding supplement (PROSource TF)  45 mL Per Tube Daily   free water  50 mL Per Tube Q4H   insulin aspart  0-20 Units Subcutaneous Q4H   insulin aspart  10 Units Subcutaneous Q4H   insulin detemir  43 Units Subcutaneous BID   mouth rinse  15 mL Mouth Rinse 10 times per day   metoprolol tartrate  5 mg Intravenous Q6H   multivitamin  1 tablet Per Tube QHS   nutrition supplement (JUVEN)  1 packet Per Tube BID BM   pantoprazole (PROTONIX) IV  40 mg Intravenous Q12H     Assessment/ Plan:  Heather Lopez is a 75 y.o.  female who has been admitted for community acquired pneumonia.    Acute Kidney Injury on chronic kidney disease stage 4 with baseline creatinine 1.97 and GFR of 25 on 12/22/19.  Acute kidney injury secondary to sepsis and obstructive uropathy Chronic kidney disease is secondary to diabetic nephropathy We will continue to hold losartan and metformin Stable creatinine with adequate UOP BUN 84 Will continue to assess need for dialysis dialy Continue supportive care Avoid nephrotoxins  Lab Results  Component Value Date   CREATININE 2.17 (H) 07/12/2021   CREATININE 2.17 (H) 07/11/2021   CREATININE 2.12 (H) 07/11/2021    Intake/Output Summary (Last 24 hours) at 07/12/2021 1001 Last data filed at 07/12/2021 0800 Gross per 24 hour  Intake 2019.4 ml  Output 3900 ml  Net -1880.6 ml    2. Acute Respiratory failure secondary to community acquired pneumonia failing outpatient antibiotics.   Mechanical ventilation Weaning trials have failed. Considering trach placement  3. Anemia of chronic kidney disease Normocytic Lab Results  Component Value Date   HGB 8.1 (L) 07/12/2021   Low threshold to initiate ESA EPO with this treatment 1 unit RBCs transfused yesterday  4.  Diabetes mellitus type II with chronic kidney disease insulin dependent. Most recent hemoglobin A1c is 8.3 on 06/12/21.  Metformin held, Stable   5.Bilateral Nephrolithiasis- with obstructive uropathy and left renal abscess Right - Mild to moderate chronic hydronephrosis without obstructing stone Left-mild left hydronephrosis with 14 mm stone at left UPJ- b/l ureteral stent placed 8/10 Lesion at upper pole of left kidney enlarged - likely hematoma or abscess  6. S/p  duodenal perforation s/p repair on 06/30/2021 Tube feeds @ 78m/hr  7. Hypernatremia - free water deficit - increase free water supplement - 200 q 3 hrs Stable at 142      LOS: 15   8/17/202210:01 AM

## 2021-07-12 NOTE — Consult Note (Signed)
WOC Nurse Consult Note: Reason for Consult:Moisture associated skin damage to inframammary area and stage 2 pressure injury to sacrum.  Wound type:pressure and moisture Pressure Injury POA: No Measurement: Beneath both breasts and between 4 cm x 32 cmx 0.1 cm  Sacrum:  2 cmx 2 cm x 0.2 cm Wound XJO:ITGP and moist Drainage (amount, consistency, odor) minimal serous Periwound:intact, moist Dressing procedure/placement/frequency: sacral dressing to sacral wound. Change every three days.  Interdry Ag to inframammary skin damage: (LAWSON # [phone]) Measure and cut length of InterDry to fit in skin folds that have skin breakdown Tuck InterDry fabric into skin folds in a single layer, allow for 2 inches of overhang from skin edges to allow for wicking to occur May remove to bathe; dry area thoroughly and then tuck into affected areas again Do not apply any creams or ointments when using InterDry DO NOT THROW AWAY FOR 5 DAYS unless soiled with stool DO NOT [facility] product, this will inactivate the silver in the material  New sheet of Interdry should be applied after 5 days of use if patient continues to have skin breakdown    Will not follow at this time.  Please re-consult if needed.  Domenic Moras MSN, RN, FNP-BC CWON Wound, Ostomy, Continence Nurse Pager [phone]

## 2021-07-12 NOTE — Progress Notes (Signed)
NAME:  Heather Lopez, MRN:  [id number], DOB:  [birth year], LOS: 30 ADMISSION DATE:  06/27/2021, CONSULTATION DATE:  06/30/21 REFERRING MD:  Hampton Abbot, MD  CHIEF COMPLAINT:  Abdominal Pain   HPI  75 y.o with significant PMH as below who presented to the ED on 06/27/21 with chief complaints of progressive shortness of breath, productive cough, loss of appetite, nausea, dry heaving fatigue, malaise and generalized body aches. Patient initially went to her PCP on 7/29 with complaints of rhinorrhea, harsh cough, loss of appetite, nausea, fatigue, malaise and generalized body aches x2 days, patient was diagnosed with pneumonia of which she received ceftriaxone 1 mg IM and sent home with Levaquin, prednisone and Tessalon.   Past Medical History  Diabetes mellitus type 2 Anxiety and depression Hypertension Hyperlipidemia Hydronephrosis and recurrent UTI Gout and osteoarthritis Significant Hospital Events   8/2: Admitted to MedSurg unit with sepsis 8/3: Vascular consulted for HD catheter 8/4: Patient developed acute abdomen.  CT abdomen shows viscus perforation.  Patient taken to the OR urgently for exploratory lap.  Patient return to ICU on vent.  PCCM consulted 07/01/21- patient had SBT today for recruitment and chest physiotherapy.  Still has significant tachycardia and hypertension when awake (possible pain related) but mentation is good able to follow verbal communication.  07/02/21-patient has failed SBT today due to hypoxemia.  Reviewed care plan with surgery team.  07/03/21- Lung V/Q scan obtained ~ negative for PE.  Weaning FiO2, currently at 50% 8/9 severe resp failure, remains on vent 8/10 s/p Cystoscopy with Bilateral ureteral stent placement (64F/24 cm) for LEFT OBSTRUCTED KIDNEY STONE 8/11 remains on VENT 8/11 failed SAT/SBT  8/12 started on insulin drip for FSBS >400, developed A. fib with RVR CT abdomen pelvis showed decreasing size of left suprarenal collection 8/13 transitioned off insulin, rate  relatively controlled 8/14 issues with increased heart rate when Cardizem decreased.  Currently at 10 mg/h plus Lopressor IV every 6H, consider transitioning Cardizem to via OG in a.m. 8/15 failing weaning trials 8/16 failed weaning trials discussed with family-likely needs [facility]   Consults:  Nephrology Vascular PCCM General Surgery Urology  Procedures:  8/5: Exploratory Lap  8/10: Cystoscopy, bilateral ureteral stent placement, bilateral retrograde pyelography   Significant Diagnostic Tests:  8/2: Chest Xray>left basilar consolidation or atelectasis and suspected small left pleural effusion 8/2: Renal ultrasound> moderate right hydronephrosis, mild left hydronephrosis new with bilateral nephrolithiasis 8/4: CTA abdomen and pelvis> Extensive pneumoperitoneum compatible with perforated viscus. Gastric perforation is suspected, though bowel wall defect is not definitively identified. Surgical consultation is recommended. 2. Large ill-defined mass arising from the upper pole left kidney consistent with renal cell carcinoma. Dedicated renal MRI may be useful when clinical situation permits. 3. Acute left-sided hydronephrosis due to a 12 mm left UPJ calculus. 4. Chronic right hydronephrosis and hydroureter without clear etiology. 5. Bilateral nonobstructing renal calculi as above. 6. Small volume ascites. 7. Trace bilateral pleural effusions with patchy bibasilar atelectasis. 8/7: Venous US BLE>> negative for DVT 8/8: Lung V/Q Scan>>Negative for pulmonary embolus.  Micro Data:  8/2: SARS-CoV-2 PCR> negative 8/2: Influenza PCR> negative 8/2: Blood culture x2> no growth 8/2: Urine Culture> 30,000 colonies yeast 8/2: Legionella urinary antigen> negative 8/10: 70,000 colonies yeast, ID pend  Antimicrobials:  8/4 Fluconazole> increased to high dose 8/12 8/4 Zosyn>   HPI/INTERVAL CHANGES Remains critically ill On vent Vent Mode: PSV FiO2 (%):  [30 %] 30 % Set Rate:  [12 bmp]  12 bmp Vt Set:  [480 mL]  480 mL PEEP:  [5 cmH20-8 cmH20] 5 cmH20 Pressure Support:  [5 cmH20] 5 cmH20 Plateau Pressure:  [24 cmH20] 24 cmH20 Critically ill +renal failure   REVIEW OF SYSTEMS  PATIENT IS UNABLE TO PROVIDE COMPLETE REVIEW OF SYSTEMS DUE TO SEVERE CRITICAL ILLNESS AND TOXIC METABOLIC ENCEPHALOPATHY   PHYSICAL EXAMINATION:  GENERAL:critically ill appearing, +resp distress EYES: Pupils equal, round, reactive to light.  No scleral icterus.  MOUTH: Moist mucosal membrane. INTUBATED NECK: Supple.  PULMONARY: +rhonchi, +wheezing CARDIOVASCULAR: S1 and S2.  No murmurs  GASTROINTESTINAL: Distended, Midline incision with dressing dry and intact. JP Drains x 2 MUSCULOSKELETAL:+edema.  NEUROLOGIC: obtunded SKIN:intact,warm,dry         OBJECTIVE  Blood pressure (!) 178/59, pulse 100, temperature 99.6 F (37.6 C), temperature source Oral, resp. rate (!) 32, height 5' 2.01" (1.575 m), weight 107.1 kg, SpO2 100 %.    Vent Mode: PSV FiO2 (%):  [30 %] 30 % Set Rate:  [12 bmp] 12 bmp Vt Set:  [480 mL] 480 mL PEEP:  [5 cmH20-8 cmH20] 5 cmH20 Pressure Support:  [5 cmH20] 5 cmH20 Plateau Pressure:  [24 cmH20] 24 cmH20   Intake/Output Summary (Last 24 hours) at 07/12/2021 0908 Last data filed at 07/12/2021 0800 Gross per 24 hour  Intake 2060.16 ml  Output 3900 ml  Net -1839.84 ml    Filed Weights   07/09/21 0500 07/10/21 0355 07/12/21 0436  Weight: 108 kg 105.5 kg 107.1 kg      Labs   CBC: Recent Labs  Lab 07/06/21 0440 07/07/21 0545 07/08/21 0440 07/09/21 0430 07/10/21 0503 07/11/21 0453 07/11/21 1510 07/12/21 0501  WBC 14.8*   < > 12.1* 12.8* 10.5 7.5  --  7.5  NEUTROABS 13.5*  --   --   --   --   --   --   --   HGB 7.5*   < > 7.6* 7.7* 7.1* 6.5* 8.6* 8.1*  HCT 24.3*   < > 24.4* 24.4* 22.3* 20.0* 26.3* 24.7*  MCV 91.0   < > 88.7 87.5 86.4 87.3  --  84.6  PLT 196   < > 210 203 171 143*  --  162   < > = values in this interval not displayed.      Basic Metabolic Panel: Recent Labs  Lab 07/09/21 0430 07/10/21 0503 07/11/21 0453 07/11/21 1510 07/12/21 0501  NA 137 141 141 140 142  K 4.1 3.3* 2.9* 3.7 3.4*  CL 108 112* 107 106 107  CO2 22 18* '26 24 25  ' GLUCOSE 394* 256* 183* 197* 185*  BUN 104* 131* 79* 81* 84*  CREATININE 3.54* 3.35* 2.12* 2.17* 2.17*  CALCIUM 7.6* 7.8* 7.8* 8.3* 8.4*  MG 2.0 2.0 1.7 1.7 1.7  PHOS 3.6 3.9 3.3 3.2 2.7    GFR: Estimated Creatinine Clearance: 26.2 mL/min (A) (by C-G formula based on SCr of 2.17 mg/dL (H)). Recent Labs  Lab 07/09/21 0430 07/10/21 0503 07/11/21 0453 07/12/21 0501  WBC 12.8* 10.5 7.5 7.5     Liver Function Tests: Recent Labs  Lab 07/06/21 0440  AST 42*  ALT 46*  ALKPHOS 83  BILITOT 0.3  PROT 5.9*  ALBUMIN 1.5*    ABG    Component Value Date/Time   PHART 7.25 (L) 07/06/2021 0951   PCO2ART 46 07/06/2021 0951   PO2ART 57 (L) 07/06/2021 0951   HCO3 20.2 07/06/2021 0951   ACIDBASEDEF 6.7 (H) 07/06/2021 0951   O2SAT 83.9 07/06/2021 0951  Allergies Allergies  Allergen Reactions   Contrast Media  [Iodinated Diagnostic Agents] Anaphylaxis   Iodine Swelling    (IV only) - angioedema    Scheduled Meds:  chlorhexidine gluconate (MEDLINE KIT)  15 mL Mouth Rinse BID   Chlorhexidine Gluconate Cloth  6 each Topical Q0600   feeding supplement (PROSource TF)  45 mL Per Tube Daily   free water  50 mL Per Tube Q4H   insulin aspart  0-20 Units Subcutaneous Q4H   insulin aspart  10 Units Subcutaneous Q4H   insulin detemir  43 Units Subcutaneous BID   mouth rinse  15 mL Mouth Rinse 10 times per day   metoprolol tartrate  5 mg Intravenous Q6H   multivitamin  1 tablet Per Tube QHS   nutrition supplement (JUVEN)  1 packet Per Tube BID BM   pantoprazole (PROTONIX) IV  40 mg Intravenous Q12H   Continuous Infusions:  sodium chloride     sodium chloride Stopped (07/11/21 0902)   dexmedetomidine (PRECEDEX) IV infusion 0.6 mcg/kg/hr (07/12/21 0600)    feeding supplement (PIVOT 1.5 CAL) 1,000 mL (07/12/21 0302)   fentaNYL infusion INTRAVENOUS Stopped (07/11/21 1141)   fluconazole (DIFLUCAN) IV Stopped (07/11/21 2153)   PRN Meds:.sodium chloride, acetaminophen, alteplase, fentaNYL, fentaNYL (SUBLIMAZE) injection, heparin, hydrALAZINE, lidocaine (PF), lidocaine-prilocaine, midazolam, ondansetron (ZOFRAN) IV, pentafluoroprop-tetrafluoroeth  Active Hospital Problem list   Acute hypoxic respiratory failure ventilator dependent Severe sepsis with septic shock Bowel perforation AKI on CKD stage IV Right UPJ stone with hydronephrosis Left renal fluid collection Anion gap metabolic acidosis Lactic acidosis Diabetes mellitus A. fib flutter with RVR Candiduria, yeast species being IDed  Assessment & Plan:   75 yo WF Acute Hypoxic Respiratory Failure in setting of Community Acquired Pneumonia,  bilateral pleural effusions with associated atelectasis complicated by  perforated Duodenal Ulcer with peritonitis Status post laparotomy Status post ureteral stent placement Left renal abscess, appears to be resolving Candiduria failure to wean from vent -S/p exploratory laparotomy and primary repair of duodenal ulcer with omental patch on 06/29/21 -S/P bilateral ureteral stent placement 8/10  Severe ACUTE Hypoxic and Hypercapnic Respiratory Failure -continue Mechanical Ventilator support -continue Bronchodilator Therapy -Wean Fio2 and PEEP as tolerated -VAP/VENT bundle implementation -will perform SAT/SBT when respiratory parameters are met I would like to consider one way and trial of extubation  SEPTIC shock-resolving SOURCE-Severe Sepsis secondary to UTI, Community Acquired Pneumonia and perforated Duodenal Ulcer with peritonitis -use vasopressors to keep MAP>65 as needed  ACUTE SYSTOLIC CARDIAC FAILURE- afib with RVR -oxygen as needed Continue Cardizem infusion at 10 mg/h Continue metoprolol IV currently 5 mg IV every 6 Avoiding  amiodarone due to need for high dose fluconazole (QT prolongation effects)   ACUTE KIDNEY INJURY/Renal Failure -continue Foley Catheter-assess need -Avoid nephrotoxic agents -Follow urine output, BMP -Ensure adequate renal perfusion, optimize oxygenation -Renal dose medications   Intake/Output Summary (Last 24 hours) at 07/12/2021 0913 Last data filed at 07/12/2021 0800 Gross per 24 hour  Intake 2060.16 ml  Output 3900 ml  Net -1839.84 ml    ACUTE ANEMIA- TRANSFUSE AS NEEDED CONSIDER TRANSFUSION  IF HGB<7 DVT PRX with TED/SCD's ONLY   ENDO - ICU hypoglycemic\Hyperglycemia protocol -check FSBS per protocol  ELECTROLYTES -follow labs as needed -replace as needed -pharmacy consultation and following   Best practice:  Diet:  NPO, TPN Pain/Anxiety/Delirium protocol (if indicated): Yes (RASS goal 0) VAP protocol (if indicated): Yes DVT prophylaxis: Heparin gtt GI prophylaxis: PPI Glucose control:  SSI Yes Central venous access:  Yes,  and it is still needed Arterial line:  Yes, and it is still needed Foley:  Yes, and it is still needed Mobility:  bed rest  PT consulted: N/A Last date of multidisciplinary goals of care discussion [8/8] Code Status:  full code Disposition: ICU    DVT/GI PRX  assessed I Assessed the need for Labs I Assessed the need for Foley I Assessed the need for Central Venous Line Family Discussion when available I Assessed the need for Mobilization I made an Assessment of medications to be adjusted accordingly Safety Risk assessment completed  CASE DISCUSSED IN MULTIDISCIPLINARY ROUNDS WITH ICU TEAM     Critical Care Time devoted to patient care services described in this note is 55 minutes.  Critical care was necessary to treat /prevent imminent and life-threatening deterioration. Overall, patient is critically ill, prognosis is guarded.  Patient with Multiorgan failure and at high risk for cardiac arrest and death.    Corrin Parker, M.D.  [facility]  Medical Director [place] Director [facility] Cardio-Pulmonary Department

## 2021-07-12 NOTE — Progress Notes (Signed)
GOALS OF CARE DISCUSSION  The Clinical status was relayed to family in detail. Husband, Daughter and 2 sons  Updated and notified of patients medical condition.    Patient remains unresponsive and will not open eyes to command.   Patient is having a weak cough and struggling to remove secretions.   Patient with increased WOB and using accessory muscles to breathe Patient has failed weaning trial today Explained to family course of therapy and the modalities    Patient with Progressive multiorgan failure with a very high probablity of a very minimal chance of meaningful recovery despite all aggressive and optimal medical therapy.     Family understands the situation.  1.They have consented and agreed to DNR status  2.will proceed with trial of extubation if there is an ideal scenario, if not will proceed with [facility] and ENT consultation  3.will plan to change out PERM cath  4.continue HD as needed  Family are satisfied with Plan of action and management. All questions answered  Additional CC time 45 mins   Linden Tagliaferro Patricia Pesa, M.D.  [facility]  Medical Director [place] Director [facility] Cardio-Pulmonary Department

## 2021-07-12 NOTE — Progress Notes (Signed)
Patient transported to CT via transport  vent with RN. 

## 2021-07-12 NOTE — Progress Notes (Signed)
ID Having low grade fever-100.6 Intubated  No pressor Apparently had a resp spell and was agitated and HR went up- had to be sedated BP (!) 107/50 (BP Location: Left Arm)   Pulse 88   Temp 99.2 F (37.3 C) (Oral)   Resp (!) 24   Ht 5' 2.01" (1.575 m)   Wt 107.1 kg   SpO2 98%   BMI 43.17 kg/m    Rt IJ HD catheter Left IJ ctaheter Foley catheter Chest b/l air entry [id number] Abd- surgical incision healing well 2 JP drain  Labs CBC Latest Ref Rng & Units 07/12/2021 07/11/2021 07/11/2021  WBC 4.0 - 10.5 K/uL 7.5 - 7.5  Hemoglobin 12.0 - 15.0 g/dL 8.1(L) 8.6(L) 6.5(L)  Hematocrit 36.0 - 46.0 % 24.7(L) 26.3(L) 20.0(L)  Platelets 150 - 400 K/uL 162 - 143(L)    CMP Latest Ref Rng & Units 07/12/2021 07/11/2021 07/11/2021  Glucose 70 - 99 mg/dL 185(H) 197(H) 183(H)  BUN 8 - 23 mg/dL 84(H) 81(H) 79(H)  Creatinine 0.44 - 1.00 mg/dL 2.17(H) 2.17(H) 2.12(H)  Sodium 135 - 145 mmol/L 142 140 141  Potassium 3.5 - 5.1 mmol/L 3.4(L) 3.7 2.9(L)  Chloride 98 - 111 mmol/L 107 106 107  CO2 22 - 32 mmol/L 25 24 26   Calcium 8.9 - 10.3 mg/dL 8.4(L) 8.3(L) 7.8(L)  Total Protein 6.5 - 8.1 g/dL - - -  Total Bilirubin 0.3 - 1.2 mg/dL - - -  Alkaline Phos 38 - 126 U/L - - -  AST 15 - 41 U/L - - -  ALT 0 - 44 U/L - - -    MICRO 8/10 UC from rt renal and left renal pelvis candida glabarata  Impression/recommendation Patient admitted with shortness of breath and was treated like pneumonia.  She then developed abdominal pain. Found to have air in abdomen Duodenal perforation: Surgery on 06/30/2021 with closure of the perforation with omental patch. Has 2 JP drains Completed Zosyn  AKI on hemodialysis   Bilateral nephrolithiasis Left hydronephrosis with UPJ obstruction.  Underwent stenting on 07/05/2021.  Hydronephrosis resolved.  Candida pyelonephritis bilateral.  urine culture positive.  Taken from the left and right kidney.  Currently on high-dose fluconazole. Candida Glabrata which is usually mod  susceptible to fluconazole- if fever worsens then may need ot switch to ampho. .  New fever- repeat CT shows left 7.8 x 3.6 cm fluid collection is seen anterior to upper pole of left kidney. It was reported on 07/07/21 CT to be 4.8X3.6cm - will discuss with IR to see whether this could be drained especially in light of new fever   Worsening anemia Received blood transfusion Heparin has been off switched off  A. fib  on diltiazem- heparin Dc Thrombocytopenia.improved  Leukocytosis has resolved   Discussed the management with care team

## 2021-07-12 NOTE — Progress Notes (Signed)
Around middle of the day patient had progressively increased work of breathing and tachypnea even though RT had already placed her back in [facility] mode on the ventilator. This was accompanied by increased heart rate and BP and clammy reddened skin which eventually progressed to diaphoresis. Temperature within normal limits. Minimal to no help from existing prn medication and increased of precedex dose. MD came to assess patient at bedside and ordered a one time increased dose of sedation and paralytic. Patient at -5 RASS before paralytic given which did help work of breathing and tachypnea. HR in 120s after and SBP over 200. MD aware as he came back by post administration of medications. Will readminister prn BP med when available.

## 2021-07-12 NOTE — Progress Notes (Addendum)
Patient seen and evaluated with Mr. Heather Lopez and agree with his note.  Patient had tmax of 100.9, but otherwise no fevers.  Unable to wean off vent.  Husband at bedside and reports that yesterday the patient was more awake and responsive.  Requiring intermittent dialysis.  From general surgery standpoint, she's tolerating tube feeds at goal, having bowel function, and her drains remain serous.  Due to high volume output from drains, would keep them in place still.  Midline incision clean, dry, intact, without any evidence of infection.  Will plan to d/c staples on 07/14/21.  She's completed course of Zosyn for her duodenal perforation, and on most recent CT scan on 8/12, there are no fluid collections around the perforation site or drains.  Continue TF and medical management.  Heather Ree, MD    [place] [facility] Day(s): 15.   Post op day(s): 13 Days Post-Op.   Interval History:  Patient seen and examined She did have T-max in last 24 hours to 100.71F at 1600 yesterday (08/16) Remains intubated/sedated CBC is pending this morning  Renal function improving, sCr - 2.17; UO - 3.1L Mild hypokalemia to 3.4 Surgical drains:             - Right mid abdomen (draining repair site); 190 ccs; serous             - RLQ (draining dependent pelvis): 140 ccs; serous Tube feedings advancing; 40 ccs/hr; at goal  She is having bowel function; multiple large loose stools daily; rectal tube placed; 850 ccs  Plan for [place] meeting with family today; nothing further to add from surgical perspective   Vital signs in last 24 hours: [min-max] current  Temp:  [98.4 F (36.9 C)-100.9 F (38.3 C)] 99.2 F (37.3 C) (08/17 0300) Pulse Rate:  [73-113] 91 (08/17 0700) Resp:  [17-33] 22 (08/17 0700) BP: (116-204)/(50-75) 156/64 (08/17 0700) SpO2:  [95 %-100 %] 100 % (08/17 0700) Arterial Line BP: (127-181)/(55-103) 181/97 (08/16 1100) FiO2 (%):  [30 %] 30 % (08/17  0700) Weight:  [107.1 kg] 107.1 kg (08/17 0436)     Height: 5' 2.01" (157.5 cm) Weight: 107.1 kg BMI (Calculated): 43.17   Intake/Output last 2 shifts:  08/16 0701 - 08/17 0700 In: 2267.9 [I.V.:343.4; Blood:413.3; NG/GT:1195; IV Piggyback:281.2] Out: 4280 [Urine:3100; Drains:330; Stool:850]   Physical Exam:  Constitutional: intubated, sedated HEENT: NGT in place; receiving tube feedings Respiratory: intubated Cardiovascular: regular rate, sinus rhythm Gastrointestinal: Soft, unable to reliably assess tenderness, non-distended, surgical drains x2 in right lower abdomen, output serous in both  Genitourinary: Foley in place  Integumentary: Midline incision is CDI with staples, no erythema, no drainage  Labs:  CBC Latest Ref Rng & Units 07/11/2021 07/11/2021 07/10/2021  WBC 4.0 - 10.5 K/uL - 7.5 10.5  Hemoglobin 12.0 - 15.0 g/dL 8.6(L) 6.5(L) 7.1(L)  Hematocrit 36.0 - 46.0 % 26.3(L) 20.0(L) 22.3(L)  Platelets 150 - 400 K/uL - 143(L) 171   CMP Latest Ref Rng & Units 07/12/2021 07/11/2021 07/11/2021  Glucose 70 - 99 mg/dL 185(H) 197(H) 183(H)  BUN 8 - 23 mg/dL 84(H) 81(H) 79(H)  Creatinine 0.44 - 1.00 mg/dL 2.17(H) 2.17(H) 2.12(H)  Sodium 135 - 145 mmol/L 142 140 141  Potassium 3.5 - 5.1 mmol/L 3.4(L) 3.7 2.9(L)  Chloride 98 - 111 mmol/L 107 106 107  CO2 22 - 32 mmol/L 25 24 26   Calcium 8.9 - 10.3 mg/dL 8.4(L) 8.3(L) 7.8(L)  Total Protein 6.5 - 8.1 g/dL - - -  Total Bilirubin 0.3 - 1.2 mg/dL - - -  Alkaline Phos 38 - 126 U/L - - -  AST 15 - 41 U/L - - -  ALT 0 - 44 U/L - - -     Imaging studies: No new pertinent imaging studies   Assessment/Plan:  75 y.o. critically ill requiring ventilator support with failure to wean female 13 days s/p exploratory laparotomy and primary repair of duodenal ulcer with omental patch   - Appreciate PCCM support; extubate per their service   - Monitor fever curve  - Can remove staples on Friday (08/19)             - Continue tube feedings; at goal    - Okay to continue stool management device  - Appreciate urology assistance; follow up UCx (growing yeast); on Diflucan  - Monitor abdominal examination; on-going bowel function - Maintain surgical drains; monitor and record output    All of the above findings and recommendations were discussed with the medical team.   -- Edison Simon, PA-C [facility] 07/12/2021, 7:16 AM [phone] M-F: 7am - 4pm

## 2021-07-13 ENCOUNTER — Encounter
Admission: EM | Disposition: A | Discharge status: Nursing Home (Includes ICF) | Payer: Self-pay | Source: Home / Self Care | Attending: Internal Medicine

## 2021-07-13 ENCOUNTER — Inpatient Hospital Stay: Payer: Medicare Other

## 2021-07-13 DIAGNOSIS — Z515 Encounter for palliative care: Secondary | ICD-10-CM

## 2021-07-13 DIAGNOSIS — B3749 Other urogenital candidiasis: Secondary | ICD-10-CM | POA: Diagnosis not present

## 2021-07-13 DIAGNOSIS — I4892 Unspecified atrial flutter: Secondary | ICD-10-CM | POA: Diagnosis not present

## 2021-07-13 DIAGNOSIS — N186 End stage renal disease: Secondary | ICD-10-CM

## 2021-07-13 DIAGNOSIS — N179 Acute kidney failure, unspecified: Secondary | ICD-10-CM | POA: Diagnosis not present

## 2021-07-13 DIAGNOSIS — N200 Calculus of kidney: Secondary | ICD-10-CM | POA: Diagnosis not present

## 2021-07-13 DIAGNOSIS — J9601 Acute respiratory failure with hypoxia: Secondary | ICD-10-CM | POA: Diagnosis not present

## 2021-07-13 DIAGNOSIS — K668 Other specified disorders of peritoneum: Secondary | ICD-10-CM | POA: Diagnosis not present

## 2021-07-13 DIAGNOSIS — N390 Urinary tract infection, site not specified: Secondary | ICD-10-CM | POA: Diagnosis not present

## 2021-07-13 DIAGNOSIS — N189 Chronic kidney disease, unspecified: Secondary | ICD-10-CM | POA: Diagnosis not present

## 2021-07-13 HISTORY — PX: DIALYSIS/PERMA CATHETER INSERTION: CATH118288

## 2021-07-13 LAB — HEPATIC FUNCTION PANEL
ALT: 114 U/L — ABNORMAL HIGH (ref 0–44)
AST: 52 U/L — ABNORMAL HIGH (ref 15–41)
Albumin: 1.7 g/dL — ABNORMAL LOW (ref 3.5–5.0)
Alkaline Phosphatase: 107 U/L (ref 38–126)
Bilirubin, Direct: <0.1 mg/dL (ref 0.0–0.2)
Total Bilirubin: 0.5 mg/dL (ref 0.3–1.2)
Total Protein: 5.3 g/dL — ABNORMAL LOW (ref 6.5–8.1)

## 2021-07-13 LAB — BASIC METABOLIC PANEL
Anion gap: 7 (ref 5–15)
BUN: 106 mg/dL — ABNORMAL HIGH (ref 8–23)
CO2: 26 mmol/L (ref 22–32)
Calcium: 8.3 mg/dL — ABNORMAL LOW (ref 8.9–10.3)
Chloride: 113 mmol/L — ABNORMAL HIGH (ref 98–111)
Creatinine, Ser: 2.45 mg/dL — ABNORMAL HIGH (ref 0.44–1.00)
GFR, Estimated: 20 mL/min — ABNORMAL LOW (ref >60)
Glucose, Bld: 235 mg/dL — ABNORMAL HIGH (ref 70–99)
Potassium: 3.5 mmol/L (ref 3.5–5.1)
Sodium: 146 mmol/L — ABNORMAL HIGH (ref 135–145)

## 2021-07-13 LAB — GLUCOSE, CAPILLARY
Glucose-Capillary: 133 mg/dL — ABNORMAL HIGH (ref 70–99)
Glucose-Capillary: 151 mg/dL — ABNORMAL HIGH (ref 70–99)
Glucose-Capillary: 215 mg/dL — ABNORMAL HIGH (ref 70–99)
Glucose-Capillary: 55 mg/dL — ABNORMAL LOW (ref 70–99)
Glucose-Capillary: 87 mg/dL (ref 70–99)
Glucose-Capillary: 88 mg/dL (ref 70–99)
Glucose-Capillary: 92 mg/dL (ref 70–99)

## 2021-07-13 LAB — CBC
HCT: 23.2 % — ABNORMAL LOW (ref 36.0–46.0)
Hemoglobin: 7.5 g/dL — ABNORMAL LOW (ref 12.0–15.0)
MCH: 28.1 pg (ref 26.0–34.0)
MCHC: 32.3 g/dL (ref 30.0–36.0)
MCV: 86.9 fL (ref 80.0–100.0)
Platelets: 180 10*3/uL (ref 150–400)
RBC: 2.67 MIL/uL — ABNORMAL LOW (ref 3.87–5.11)
RDW: 17.4 % — ABNORMAL HIGH (ref 11.5–15.5)
WBC: 5.6 10*3/uL (ref 4.0–10.5)
nRBC: 0 % (ref 0.0–0.2)

## 2021-07-13 LAB — MAGNESIUM: Magnesium: 2 mg/dL (ref 1.7–2.4)

## 2021-07-13 LAB — PHOSPHORUS: Phosphorus: 3.1 mg/dL (ref 2.5–4.6)

## 2021-07-13 SURGERY — DIALYSIS/PERMA CATHETER INSERTION
Anesthesia: Moderate Sedation

## 2021-07-13 MED ORDER — HEPARIN SODIUM (PORCINE) 10000 UNIT/ML IJ SOLN
INTRAMUSCULAR | Status: AC
  Filled 2021-07-13: qty 1

## 2021-07-13 MED ORDER — INSULIN DETEMIR 100 UNIT/ML ~~LOC~~ SOLN
46.0000 [IU] | Freq: Two times a day (BID) | SUBCUTANEOUS | Status: DC
  Administered 2021-07-14 – 2021-07-16 (×4): 46 [IU] via SUBCUTANEOUS
  Filled 2021-07-13 (×7): qty 0.46

## 2021-07-13 MED ORDER — DEXTROSE 50 % IV SOLN
INTRAVENOUS | Status: AC
  Filled 2021-07-13: qty 50

## 2021-07-13 MED ORDER — DEXTROSE 50 % IV SOLN
12.5000 g | Freq: Once | INTRAVENOUS | Status: AC
  Administered 2021-07-13: 12.5 g via INTRAVENOUS

## 2021-07-13 MED ORDER — INSULIN ASPART 100 UNIT/ML IJ SOLN
12.0000 [IU] | INTRAMUSCULAR | Status: DC
  Administered 2021-07-13 – 2021-07-16 (×12): 12 [IU] via SUBCUTANEOUS
  Filled 2021-07-13 (×12): qty 1

## 2021-07-13 SURGICAL SUPPLY — 6 items
BIOPATCH RED 1 DISK 7.0 (GAUZE/BANDAGES/DRESSINGS) ×2 IMPLANT
CATH PALINDROME-SP 14.5FX23 (CATHETERS) ×2 IMPLANT
GUIDEWIRE SUPER STIFF .035X180 (WIRE) ×2 IMPLANT
PACK ANGIOGRAPHY (CUSTOM PROCEDURE TRAY) ×2 IMPLANT
SUT MNCRL AB 4-0 PS2 18 (SUTURE) ×2 IMPLANT
SUT PROLENE 0 CT 1 30 (SUTURE) ×2 IMPLANT

## 2021-07-13 NOTE — Progress Notes (Signed)
Pre HD RN assessment 

## 2021-07-13 NOTE — Progress Notes (Signed)
Date of Admission:  06/27/2021    ID: Heather Lopez is a 75 y.o. female  Active Problems:   Uncontrolled type 2 diabetes mellitus with insulin therapy ([place])   Hyperlipidemia associated with type 2 diabetes mellitus ([place])   Acute respiratory failure with hypoxia (HCC)   Severe sepsis with septic shock ([place])   Acute lower UTI   Acute kidney injury superimposed on chronic kidney disease ([place])   Pneumoperitoneum   Pressure injury of skin   Atrial flutter with rapid ventricular response (HCC)   Endotracheally intubated   Bilateral nephrolithiasis   Anemia of chronic disease   Demand ischemia of myocardium (HCC)    Subjective: Remains intubated  Medications:   amLODipine  10 mg Per Tube Daily   chlorhexidine gluconate (MEDLINE KIT)  15 mL Mouth Rinse BID   Chlorhexidine Gluconate Cloth  6 each Topical Q0600   feeding supplement (PROSource TF)  45 mL Per Tube Daily   free water  50 mL Per Tube Q4H   heparin       heparin injection (subcutaneous)  5,000 Units Subcutaneous Q8H   insulin aspart  0-20 Units Subcutaneous Q4H   insulin aspart  12 Units Subcutaneous Q4H   insulin detemir  46 Units Subcutaneous BID   mouth rinse  15 mL Mouth Rinse 10 times per day   metoprolol tartrate  25 mg Per Tube BID   multivitamin  1 tablet Per Tube QHS   nutrition supplement (JUVEN)  1 packet Per Tube BID BM   pantoprazole (PROTONIX) IV  40 mg Intravenous Q12H    Objective: Vital signs in last 24 hours: Temp:  [98.5 F (36.9 C)-100.6 F (38.1 C)] 99 F (37.2 C) (08/18 1240) Pulse Rate:  [75-119] 79 (08/18 1400) Resp:  [16-29] 26 (08/18 1400) BP: (100-165)/(42-97) 132/56 (08/18 1400) SpO2:  [98 %-100 %] 100 % (08/18 1400) FiO2 (%):  [24 %-30 %] 24 % (08/18 1204) Weight:  [270 kg] 112 kg (08/18 0500)  PHYSICAL EXAM:  General: Intubated and sedated  head: Normocephalic, without obvious abnormality, atraumatic. Eyes: Conjunctivae clear, anicteric sclerae. Pupils are equal ENT not  examined Neck: Left IJ hemodialysis catheter. Lungs: Full air entry Heart: Regular rate and rhythm, no murmur, rub or gallop. Abdomen: Surgical incision site clean, no discharge, no erythema.  2 JP drains. Foley catheter Extremities: atraumatic, no cyanosis. No edema. No clubbing Skin: No rashes or lesions. Or bruising Lymph: Cervical, supraclavicular normal. Neurologic: Cannot assess  Lab Results Recent Labs    07/12/21 0501 07/13/21 0435  WBC 7.5 5.6  HGB 8.1* 7.5*  HCT 24.7* 23.2*  NA 142 146*  K 3.4* 3.5  CL 107 113*  CO2 25 26  BUN 84* 106*  CREATININE 2.17* 2.45*   Liver Panel Recent Labs    07/13/21 0500  PROT 5.3*  ALBUMIN 1.7*  AST 52*  ALT 114*  ALKPHOS 107  BILITOT 0.5  BILIDIR <0.1  IBILI NOT CALCULATED   Sedimentation Rate No results for input(s): ESRSEDRATE in the last 72 hours. C-Reactive Protein No results for input(s): CRP in the last 72 hours.  Microbiology:  Studies/Results: CT ABDOMEN PELVIS WO CONTRAST  Result Date: 07/12/2021 CLINICAL DATA:  Fever, sacral ulcer. EXAM: CT ABDOMEN AND PELVIS WITHOUT CONTRAST TECHNIQUE: Multidetector CT imaging of the abdomen and pelvis was performed following the standard protocol without IV contrast. COMPARISON:  July 07, 2021. FINDINGS: Lower chest: Mild bilateral posterior basilar subsegmental atelectasis or infiltrates are noted. Possible small left  pleural effusion is noted. Hepatobiliary: No focal liver abnormality is seen. No gallstones, gallbladder wall thickening, or biliary dilatation. Pancreas: Unremarkable. No pancreatic ductal dilatation or surrounding inflammatory changes. Spleen: Normal in size without focal abnormality. Adrenals/Urinary Tract: Adrenal glands appear normal. Bilateral ureteral stents are again noted. Urinary bladder is decompressed secondary to Foley catheter. No definite hydronephrosis is noted. 7.8 x 3.6 cm fluid collection is seen anterior to upper pole of left kidney which is  not significantly changed in size based on my own measurements of prior study. Bilateral nonobstructive nephrolithiasis is again noted. Stomach/Bowel: Rectal tube is noted. Nasogastric tube tip is seen in proximal stomach. No definite evidence of bowel obstruction or inflammation is noted. Vascular/Lymphatic: Aortic atherosclerosis. Stable retroperitoneal lymph nodes are noted which most likely reactive in etiology. Reproductive: Possible small uterine fibroids. No adnexal abnormality is noted. Other: Grossly stable position of surgical drain entering right lower quadrant with distal tip in central pelvis. Stable position of surgical drain entering right upper quadrant and passing underneath right hepatic lobe with distal tip inferior to left hepatic lobe. No definite fluid collections are noted. Musculoskeletal: No acute or significant osseous findings. IMPRESSION: Mild bilateral posterior basilar subsegmental atelectasis or infiltrates with possible small left pleural effusion. Stable bilateral ureteral stents are noted without definite hydronephrosis. Grossly stable 7.8 x 3.6 cm fluid collection is seen anterior to upper pole of left kidney. Bilateral nonobstructive nephrolithiasis. Grossly stable position of 2 surgical drains as described above. No definite fluid collection is noted around these catheters. Aortic Atherosclerosis (ICD10-I70.0). Electronically Signed   By: Marijo Conception M.D.   On: 07/12/2021 19:32   PERIPHERAL VASCULAR CATHETERIZATION  Result Date: 07/13/2021 See surgical note for result.  DG Chest Port 1 View  Result Date: 07/13/2021 CLINICAL DATA:  Acute respiratory failure with hypoxia EXAM: PORTABLE CHEST 1 VIEW COMPARISON:  07/10/2021 FINDINGS: Endotracheal tube, enteric tube, bilateral IJ approach central lines are unchanged. Linear atelectasis bilaterally. Similar pulmonary vascular congestion. Cardiomediastinal contours are stable. No pneumothorax or significant pleural effusion.  IMPRESSION: Unchanged lines and tubes. Similar pulmonary vascular congestion and bilateral patchy linear atelectasis. Electronically Signed   By: Macy Mis M.D.   On: 07/13/2021 08:09     Assessment/Plan:  Patient admitted with shortness of breath and was treated like pneumonia.  She then developed abdominal pain found to have free air in abdomen.  Taken for lap and had duodenal perforation.  Closure of perforation on 06/30/2021. Has 2P JP drains Completed Zosyn  AKI on hemodialysis  Bilateral nephrolithiasis.  Bilateral stent placement. Left hydronephrosis Candida pyelonephritis bilateral Urine culture positive for Candida glabrata. Will discuss with IR regarding the left upper pole renal collection  Worsening anemia  Received blood transfusion  Heparin has been switched off  A. fib on diltiazem  Thrombocytopenia has resolved Discussed the management with the care team.

## 2021-07-13 NOTE — Progress Notes (Signed)
Inpatient Diabetes Program Recommendations  AACE/ADA: New Consensus Statement on Inpatient Glycemic Control (2015)  Target Ranges:  Prepandial:   less than 140 mg/dL      Peak postprandial:   less than 180 mg/dL (1-2 hours)      Critically ill patients:  140 - 180 mg/dL   Lab Results  Component Value Date   GLUCAP 151 (H) 07/13/2021   HGBA1C 8.5 (H) 07/09/2021    Review of Glycemic Control Results for Antonisha, Waskey Aujanae E (MRN [id number]) as of 07/13/2021 10:40  Ref. Range 07/12/2021 15:16 07/12/2021 20:03 07/12/2021 23:07 07/13/2021 04:29 07/13/2021 07:52  Glucose-Capillary Latest Ref Range: 70 - 99 mg/dL 288 (H) 325 (H) 265 (H) 215 (H) 151 (H)  Home DM Meds: Novolog 6 units TID with meals + Correction                             Levemir 52 units BID                             Metformin 1000 mg BID                             Trulicity 2.77 mg Qweek   Current Orders: Levemir 43 units BID                           Novolog Resistant Correction Scale/ SSI (0-20 units) Q4 hours                           Novolog 10 units Q4 hours  Inpatient Diabetes Program Recommendations:   Please increase Levemir to 46 units bid.  Also consider increasing Novolog tube feed coverage to 12 units q 4 hours.   Thanks,  Adah Perl, RN, BC-ADM Inpatient Diabetes Coordinator Pager [phone]  (8a-5p)

## 2021-07-13 NOTE — Progress Notes (Signed)
[facility]  ROUNDING NOTE   Subjective:   Heather Lopez is a 75 y.o. white female who has been admitted for community acquired pneumonia.    Hospital course complicated by duodenal perforation s/p repair on 06/30/2021 ARF - requiring HD PRN Acute resp failure requiring mech ventilation Left obstructive hydronephrosis and renal abscess, Patient underwent cystoscopy and b/l ureteral stent placement 8/10  Critically ill Intubated and sedated on ventilator, failed weaning trials Tube feeds at 64m/hr Foley with clear yellow urine UOP 2.2L in past 24 hours BUN 106   Objective:  Vital signs in last 24 hours:  Temp:  [98.5 F (36.9 C)-100.6 F (38.1 C)] 99.2 F (37.3 C) (08/17 2000) Pulse Rate:  [75-128] 82 (08/18 0800) Resp:  [12-40] 22 (08/18 0800) BP: (100-211)/(42-97) 149/60 (08/18 0909) SpO2:  [96 %-100 %] 100 % (08/18 0806) FiO2 (%):  [24 %-30 %] 24 % (08/18 0806) Weight:  [[973kg] 112 kg (08/18 0500)  Weight change: 4.9 kg Filed Weights   07/10/21 0355 07/12/21 0436 07/13/21 0500  Weight: 105.5 kg 107.1 kg 112 kg    Intake/Output: I/O last 3 completed shifts: In: 2727 [I.V.:541.9; Other:95; NG/GT:1690; IV Piggyback:400.1] Out: 4330 [Urine:3900; Drains:280; Stool:150]   Intake/Output this shift:  Total I/O In: -  Out: 1900 [Urine:1000; Stool:900]  Physical Exam: General: Critically ill  Head: ETT, OGT  Eyes: Anicteric  Lungs:  Vent assisted  Heart: Regular rate and rhythm  Abdomen:  +JP drains  Extremities:  trace peripheral edema.dependent BUE  Neurologic:  sedated  Skin: No lesions  Access:  RIJ temp cath  Left IJ PermCath-nonfunctional  Basic Metabolic Panel: Recent Labs  Lab 07/10/21 0503 07/11/21 0453 07/11/21 1510 07/12/21 0501 07/13/21 0435  NA 141 141 140 142 146*  K 3.3* 2.9* 3.7 3.4* 3.5  CL 112* 107 106 107 113*  CO2 18* '26 24 25 26  ' GLUCOSE 256* 183* 197* 185* 235*  BUN 131* 79* 81* 84* 106*  CREATININE 3.35* 2.12* 2.17*  2.17* 2.45*  CALCIUM 7.8* 7.8* 8.3* 8.4* 8.3*  MG 2.0 1.7 1.7 1.7 2.0  PHOS 3.9 3.3 3.2 2.7 3.1     Liver Function Tests: No results for input(s): AST, ALT, ALKPHOS, BILITOT, PROT, ALBUMIN in the last 168 hours.  No results for input(s): LIPASE, AMYLASE in the last 168 hours. No results for input(s): AMMONIA in the last 168 hours.  CBC: Recent Labs  Lab 07/09/21 0430 07/10/21 0503 07/11/21 0453 07/11/21 1510 07/12/21 0501 07/13/21 0435  WBC 12.8* 10.5 7.5  --  7.5 5.6  HGB 7.7* 7.1* 6.5* 8.6* 8.1* 7.5*  HCT 24.4* 22.3* 20.0* 26.3* 24.7* 23.2*  MCV 87.5 86.4 87.3  --  84.6 86.9  PLT 203 171 143*  --  162 180     Cardiac Enzymes: No results for input(s): CKTOTAL, CKMB, CKMBINDEX, TROPONINI in the last 168 hours.  BNP: Invalid input(s): POCBNP  CBG: Recent Labs  Lab 07/12/21 1516 07/12/21 2003 07/12/21 2307 07/13/21 0429 07/13/21 0752  GLUCAP 288* 325* 265* 215* 151*     Microbiology: Results for orders placed or performed during the hospital encounter of 06/27/21  Resp Panel by RT-PCR (Flu A&B, Covid) Nasopharyngeal Swab     Status: None   Collection Time: 06/27/21 12:32 AM   Specimen: Nasopharyngeal Swab; Nasopharyngeal(NP) swabs in vial transport medium  Result Value Ref Range Status   SARS Coronavirus 2 by RT PCR NEGATIVE NEGATIVE Final    Comment: (NOTE) SARS-CoV-2 target nucleic acids  are NOT DETECTED.  The SARS-CoV-2 RNA is generally detectable in upper respiratory specimens during the acute phase of infection. The lowest concentration of SARS-CoV-2 viral copies this assay can detect is 138 copies/mL. A negative result does not preclude SARS-Cov-2 infection and should not be used as the sole basis for treatment or other patient management decisions. A negative result may occur with  improper specimen collection/handling, submission of specimen other than nasopharyngeal swab, presence of viral mutation(s) within the areas targeted by this assay,  and inadequate number of viral copies(<138 copies/mL). A negative result must be combined with clinical observations, patient history, and epidemiological information. The expected result is Negative.  Fact Sheet for Patients:  EntrepreneurPulse.com.au  Fact Sheet for Healthcare Providers:  IncredibleEmployment.be  This test is no t yet approved or cleared by the [place] FDA and  has been authorized for detection and/or diagnosis of SARS-CoV-2 by FDA under an Emergency Use Authorization (EUA). This EUA will remain  in effect (meaning this test can be used) for the duration of the COVID-19 declaration under Section 564(b)(1) of the Act, 21 U.S.C.section 360bbb-3(b)(1), unless the authorization is terminated  or revoked sooner.       Influenza A by PCR NEGATIVE NEGATIVE Final   Influenza B by PCR NEGATIVE NEGATIVE Final    Comment: (NOTE) The Xpert Xpress SARS-CoV-2/FLU/RSV plus assay is intended as an aid in the diagnosis of influenza from Nasopharyngeal swab specimens and should not be used as a sole basis for treatment. Nasal washings and aspirates are unacceptable for Xpert Xpress SARS-CoV-2/FLU/RSV testing.  Fact Sheet for Patients: EntrepreneurPulse.com.au  Fact Sheet for Healthcare Providers: IncredibleEmployment.be  This test is not yet approved or cleared by the [place] FDA and has been authorized for detection and/or diagnosis of SARS-CoV-2 by FDA under an Emergency Use Authorization (EUA). This EUA will remain in effect (meaning this test can be used) for the duration of the COVID-19 declaration under Section 564(b)(1) of the Act, 21 U.S.C. section 360bbb-3(b)(1), unless the authorization is terminated or revoked.  Performed at [facility], [address] [place], [place] [address]   Blood culture (routine x 2)     Status: None   Collection Time: 06/27/21 12:43 AM    Specimen: BLOOD  Result Value Ref Range Status   Specimen Description BLOOD RIGHT ASSIST CONTROL  Final   Special Requests   Final    BOTTLES DRAWN AEROBIC AND ANAEROBIC Blood Culture results may not be optimal due to an inadequate volume of blood received in culture bottles   Culture   Final    NO GROWTH 5 DAYS Performed at [facility], [address], [place], [place] [address]    Report Status 07/02/2021 FINAL  Final  Blood culture (routine x 2)     Status: None   Collection Time: 06/27/21  1:45 AM   Specimen: BLOOD  Result Value Ref Range Status   Specimen Description BLOOD RIGHT ASSIST CONTROL  Final   Special Requests   Final    BOTTLES DRAWN AEROBIC AND ANAEROBIC Blood Culture adequate volume   Culture   Final    NO GROWTH 5 DAYS Performed at [facility], [address] [place], [place] [address]    Report Status 07/02/2021 FINAL  Final  Urine Culture     Status: Abnormal   Collection Time: 06/27/21  2:39 AM   Specimen: In/Out Cath Urine  Result Value Ref Range Status   Specimen Description   Final  IN/OUT CATH URINE Performed at [facility], [address] [place], [place] [address]    Special Requests   Final    NONE Performed at [facility] [place], [place] [address]    Culture 30,000 COLONIES/mL YEAST (A)  Final   Report Status 06/28/2021 FINAL  Final  MRSA Next Gen by PCR, Nasal     Status: None   Collection Time: 06/30/21  5:01 AM   Specimen: Nasal Mucosa; Nasal Swab  Result Value Ref Range Status   MRSA by PCR Next Gen NOT DETECTED NOT DETECTED Final    Comment: (NOTE) The GeneXpert MRSA Assay (FDA approved for NASAL specimens only), is one component of a comprehensive MRSA colonization surveillance program. It is not intended to diagnose MRSA infection nor to guide or monitor treatment for MRSA infections. Test performance is not FDA approved in patients less than 69 years old. Performed at  [facility], [address], [place], [place] [address]   Urine Culture     Status: Abnormal   Collection Time: 07/05/21 10:36 AM   Specimen: PATH Other; Urine  Result Value Ref Range Status   Specimen Description   Final    URINE, RANDOM LEFT RENAL PELVIS URINE Performed at [facility], [address], [place], [place] [address]    Special Requests   Final    NONE Performed at [facility], [address] [place], [place] [address]    Culture 70,000 COLONIES/mL CANDIDA GLABRATA (A)  Final   Report Status 07/12/2021 FINAL  Final  Urine Culture     Status: Abnormal (Preliminary result)   Collection Time: 07/05/21 10:48 AM   Specimen: PATH Other; Urine  Result Value Ref Range Status   Specimen Description   Final    URINE, RANDOM RIGHT KIDNEY URINE Performed at [facility], [address], [place], [place] [address]    Special Requests   Final    NONE Performed at [facility], [address] [place], [place] [address]    Culture (A)  Final    >=100,000 COLONIES/mL CANDIDA GLABRATA Sent to [place] for further susceptibility testing. Performed at [facility], [place] [address], [place], [place] [address]    Report Status PENDING  Incomplete    Coagulation Studies: No results for input(s): LABPROT, INR in the last 72 hours.   Urinalysis: No results for input(s): COLORURINE, LABSPEC, PHURINE, GLUCOSEU, HGBUR, BILIRUBINUR, KETONESUR, PROTEINUR, UROBILINOGEN, NITRITE, LEUKOCYTESUR in the last 72 hours.  Invalid input(s): APPERANCEUR     Imaging: CT ABDOMEN PELVIS WO CONTRAST  Result Date: 07/12/2021 CLINICAL DATA:  Fever, sacral ulcer. EXAM: CT ABDOMEN AND PELVIS WITHOUT CONTRAST TECHNIQUE: Multidetector CT imaging of the abdomen and pelvis was performed following the standard protocol without IV contrast. COMPARISON:  July 07, 2021. FINDINGS: Lower chest: Mild bilateral posterior basilar subsegmental atelectasis  or infiltrates are noted. Possible small left pleural effusion is noted. Hepatobiliary: No focal liver abnormality is seen. No gallstones, gallbladder wall thickening, or biliary dilatation. Pancreas: Unremarkable. No pancreatic ductal dilatation or surrounding inflammatory changes. Spleen: Normal in size without focal abnormality. Adrenals/Urinary Tract: Adrenal glands appear normal. Bilateral ureteral stents are again noted. Urinary bladder is decompressed secondary to Foley catheter. No definite hydronephrosis is noted. 7.8 x 3.6 cm fluid collection is seen anterior to upper pole of left kidney which is not significantly changed in size based on my own measurements of prior study. Bilateral nonobstructive nephrolithiasis is again  noted. Stomach/Bowel: Rectal tube is noted. Nasogastric tube tip is seen in proximal stomach. No definite evidence of bowel obstruction or inflammation is noted. Vascular/Lymphatic: Aortic atherosclerosis. Stable retroperitoneal lymph nodes are noted which most likely reactive in etiology. Reproductive: Possible small uterine fibroids. No adnexal abnormality is noted. Other: Grossly stable position of surgical drain entering right lower quadrant with distal tip in central pelvis. Stable position of surgical drain entering right upper quadrant and passing underneath right hepatic lobe with distal tip inferior to left hepatic lobe. No definite fluid collections are noted. Musculoskeletal: No acute or significant osseous findings. IMPRESSION: Mild bilateral posterior basilar subsegmental atelectasis or infiltrates with possible small left pleural effusion. Stable bilateral ureteral stents are noted without definite hydronephrosis. Grossly stable 7.8 x 3.6 cm fluid collection is seen anterior to upper pole of left kidney. Bilateral nonobstructive nephrolithiasis. Grossly stable position of 2 surgical drains as described above. No definite fluid collection is noted around these catheters.  Aortic Atherosclerosis (ICD10-I70.0). Electronically Signed   By: Marijo Conception M.D.   On: 07/12/2021 19:32   DG Chest Port 1 View  Result Date: 07/13/2021 CLINICAL DATA:  Acute respiratory failure with hypoxia EXAM: PORTABLE CHEST 1 VIEW COMPARISON:  07/10/2021 FINDINGS: Endotracheal tube, enteric tube, bilateral IJ approach central lines are unchanged. Linear atelectasis bilaterally. Similar pulmonary vascular congestion. Cardiomediastinal contours are stable. No pneumothorax or significant pleural effusion. IMPRESSION: Unchanged lines and tubes. Similar pulmonary vascular congestion and bilateral patchy linear atelectasis. Electronically Signed   By: Macy Mis M.D.   On: 07/13/2021 08:09     Medications:    sodium chloride     sodium chloride Stopped (07/11/21 0902)   dexmedetomidine (PRECEDEX) IV infusion 0.6 mcg/kg/hr (07/13/21 0910)   feeding supplement (PIVOT 1.5 CAL) 1,000 mL (07/12/21 0302)   fentaNYL infusion INTRAVENOUS Stopped (07/11/21 1141)   fluconazole (DIFLUCAN) IV Stopped (07/12/21 2220)    amLODipine  10 mg Per Tube Daily   chlorhexidine gluconate (MEDLINE KIT)  15 mL Mouth Rinse BID   Chlorhexidine Gluconate Cloth  6 each Topical Q0600   feeding supplement (PROSource TF)  45 mL Per Tube Daily   free water  50 mL Per Tube Q4H   heparin injection (subcutaneous)  5,000 Units Subcutaneous Q8H   insulin aspart  0-20 Units Subcutaneous Q4H   insulin aspart  10 Units Subcutaneous Q4H   insulin detemir  43 Units Subcutaneous BID   mouth rinse  15 mL Mouth Rinse 10 times per day   metoprolol tartrate  25 mg Per Tube BID   multivitamin  1 tablet Per Tube QHS   nutrition supplement (JUVEN)  1 packet Per Tube BID BM   pantoprazole (PROTONIX) IV  40 mg Intravenous Q12H     Assessment/ Plan:  Heather Lopez is a 75 y.o.  female who has been admitted for community acquired pneumonia.    Acute Kidney Injury on chronic kidney disease stage 4 with baseline creatinine 1.97  and GFR of 25 on 12/22/19.  Acute kidney injury secondary to sepsis and obstructive uropathy Chronic kidney disease is secondary to diabetic nephropathy Continue to hold losartan and metformin Creatinine increasing with adequate UOP BUN increased to 106. Will dialyze today Will continue to assess need for dialysis dialy Continue supportive care Avoid nephrotoxins  Lab Results  Component Value Date   CREATININE 2.45 (H) 07/13/2021   CREATININE 2.17 (H) 07/12/2021   CREATININE 2.17 (H) 07/11/2021    Intake/Output Summary (Last 24 hours) at  07/13/2021 0944 Last data filed at 07/13/2021 0800 Gross per 24 hour  Intake 1500.87 ml  Output 3840 ml  Net -2339.13 ml    2. Acute Respiratory failure secondary to community acquired pneumonia failing outpatient antibiotics.  Mechanical ventilation Weaning trials have failed. Considering trach placement  3. Anemia of chronic kidney disease Normocytic Lab Results  Component Value Date   HGB 7.5 (L) 07/13/2021   Low threshold to initiate ESA EPO with this treatment 1 unit RBCs transfused yesterday  4.  Diabetes mellitus type II with chronic kidney disease insulin dependent. Most recent hemoglobin A1c is 8.3 on 06/12/21.  Metformin held, Stable   5.Bilateral Nephrolithiasis- with obstructive uropathy and left renal abscess Right - Mild to moderate chronic hydronephrosis without obstructing stone Left-mild left hydronephrosis with 14 mm stone at left UPJ- b/l ureteral stent placed 8/10 Lesion at upper pole of left kidney enlarged - likely hematoma or abscess  6. S/p  duodenal perforation s/p repair on 06/30/2021 Tube feeds @ 39m/hr  7. Hypernatremia - free water deficit - increase free water supplement - 200 q 3 hrs Stable       LOS: 16   8/18/20229:44 AM

## 2021-07-13 NOTE — Progress Notes (Signed)
NAME:  Heather Lopez, MRN:  [id number], DOB:  [birth year], LOS: 42 ADMISSION DATE:  06/27/2021, CONSULTATION DATE:  06/30/21 REFERRING MD:  Hampton Abbot, MD  CHIEF COMPLAINT:  Abdominal Pain   HPI  75 y.o with significant PMH as below who presented to the ED on 06/27/21 with chief complaints of progressive shortness of breath, productive cough, loss of appetite, nausea, dry heaving fatigue, malaise and generalized body aches. Patient initially went to her PCP on 7/29 with complaints of rhinorrhea, harsh cough, loss of appetite, nausea, fatigue, malaise and generalized body aches x2 days, patient was diagnosed with pneumonia of which she received ceftriaxone 1 mg IM and sent home with Levaquin, prednisone and Tessalon.   Past Medical History  Diabetes mellitus type 2 Anxiety and depression Hypertension Hyperlipidemia Hydronephrosis and recurrent UTI Gout and osteoarthritis Significant Hospital Events   8/2: Admitted to MedSurg unit with sepsis 8/3: Vascular consulted for HD catheter 8/4: Patient developed acute abdomen.  CT abdomen shows viscus perforation.  Patient taken to the OR urgently for exploratory lap.  Patient return to ICU on vent.  PCCM consulted 07/01/21- patient had SBT today for recruitment and chest physiotherapy.  Still has significant tachycardia and hypertension when awake (possible pain related) but mentation is good able to follow verbal communication.  07/02/21-patient has failed SBT today due to hypoxemia.  Reviewed care plan with surgery team.  07/03/21- Lung V/Q scan obtained ~ negative for PE.  Weaning FiO2, currently at 50% 8/9 severe resp failure, remains on vent 8/10 s/p Cystoscopy with Bilateral ureteral stent placement (76F/24 cm) for LEFT OBSTRUCTED KIDNEY STONE 8/11 remains on VENT 8/11 failed SAT/SBT  8/12 started on insulin drip for FSBS >400, developed A. fib with RVR CT abdomen pelvis showed decreasing size of left suprarenal collection 8/13 transitioned off insulin, rate  relatively controlled 8/14 issues with increased heart rate when Cardizem decreased.  Currently at 10 mg/h plus Lopressor IV every 6H, consider transitioning Cardizem to via OG in a.m. 8/15 failing weaning trials 8/16 failed weaning trials discussed with family-likely needs [facility]    Consults:  Nephrology Vascular PCCM General Surgery Urology  Procedures:  8/5: Exploratory Lap  8/10: Cystoscopy, bilateral ureteral stent placement, bilateral retrograde pyelography   Significant Diagnostic Tests:  8/2: Chest Xray>left basilar consolidation or atelectasis and suspected small left pleural effusion 8/2: Renal ultrasound> moderate right hydronephrosis, mild left hydronephrosis new with bilateral nephrolithiasis 8/4: CTA abdomen and pelvis> Extensive pneumoperitoneum compatible with perforated viscus. Gastric perforation is suspected, though bowel wall defect is not definitively identified. Surgical consultation is recommended. 2. Large ill-defined mass arising from the upper pole left kidney consistent with renal cell carcinoma. Dedicated renal MRI may be useful when clinical situation permits. 3. Acute left-sided hydronephrosis due to a 12 mm left UPJ calculus. 4. Chronic right hydronephrosis and hydroureter without clear etiology. 5. Bilateral nonobstructing renal calculi as above. 6. Small volume ascites. 7. Trace bilateral pleural effusions with patchy bibasilar atelectasis. 8/7: Venous US BLE>> negative for DVT 8/8: Lung V/Q Scan>>Negative for pulmonary embolus.  Micro Data:  8/2: SARS-CoV-2 PCR> negative 8/2: Influenza PCR> negative 8/2: Blood culture x2> no growth 8/2: Urine Culture> 30,000 colonies yeast 8/2: Legionella urinary antigen> negative 8/10: 70,000 colonies yeast, ID pend  Antimicrobials:  8/4 Fluconazole> increased to high dose 8/12 8/4 Zosyn>   HPI/INTERVAL CHANGES Remains critically ill Plan for HD today Plan for SAT/SBT when family arrives and when  patient is stable Severe renal failure Patient is DNR, plan to  re-intubation if needed   REVIEW OF SYSTEMS  PATIENT IS UNABLE TO PROVIDE COMPLETE REVIEW OF SYSTEMS DUE TO SEVERE CRITICAL ILLNESS AND TOXIC METABOLIC ENCEPHALOPATHY  PHYSICAL EXAMINATION:  GENERAL:critically ill appearing, +resp distress EYES: Pupils equal, round, reactive to light.  No scleral icterus.  MOUTH: Moist mucosal membrane. INTUBATED NECK: Supple.  PULMONARY: +rhonchi, +wheezing CARDIOVASCULAR: S1 and S2.  No murmurs  GASTROINTESTINAL: Distended, Midline incision with dressing dry and intact. JP Drains x 2 MUSCULOSKELETAL: +edema.  NEUROLOGIC: obtunded SKIN:intact,warm,dry          OBJECTIVE  Blood pressure (!) 117/97, pulse 80, temperature 99.2 F (37.3 C), temperature source Oral, resp. rate 17, height 5' 2.01" (1.575 m), weight 112 kg, SpO2 99 %.    Vent Mode: PRVC FiO2 (%):  [30 %] 30 % Set Rate:  [12 bmp] 12 bmp Vt Set:  [480 mL] 480 mL PEEP:  [5 cmH20-8 cmH20] 8 cmH20 Pressure Support:  [5 cmH20] 5 cmH20   Intake/Output Summary (Last 24 hours) at 07/13/2021 0728 Last data filed at 07/13/2021 0700 Gross per 24 hour  Intake 1681.75 ml  Output 2390 ml  Net -708.25 ml    Filed Weights   07/10/21 0355 07/12/21 0436 07/13/21 0500  Weight: 105.5 kg 107.1 kg 112 kg      Labs   CBC: Recent Labs  Lab 07/09/21 0430 07/10/21 0503 07/11/21 0453 07/11/21 1510 07/12/21 0501 07/13/21 0435  WBC 12.8* 10.5 7.5  --  7.5 5.6  HGB 7.7* 7.1* 6.5* 8.6* 8.1* 7.5*  HCT 24.4* 22.3* 20.0* 26.3* 24.7* 23.2*  MCV 87.5 86.4 87.3  --  84.6 86.9  PLT 203 171 143*  --  162 180     Basic Metabolic Panel: Recent Labs  Lab 07/10/21 0503 07/11/21 0453 07/11/21 1510 07/12/21 0501 07/13/21 0435  NA 141 141 140 142 146*  K 3.3* 2.9* 3.7 3.4* 3.5  CL 112* 107 106 107 113*  CO2 18* '26 24 25 26  ' GLUCOSE 256* 183* 197* 185* 235*  BUN 131* 79* 81* 84* 106*  CREATININE 3.35* 2.12* 2.17* 2.17*  2.45*  CALCIUM 7.8* 7.8* 8.3* 8.4* 8.3*  MG 2.0 1.7 1.7 1.7 2.0  PHOS 3.9 3.3 3.2 2.7 3.1    GFR: Estimated Creatinine Clearance: 23.8 mL/min (A) (by C-G formula based on SCr of 2.45 mg/dL (H)). Recent Labs  Lab 07/10/21 0503 07/11/21 0453 07/12/21 0501 07/13/21 0435  WBC 10.5 7.5 7.5 5.6     Liver Function Tests: No results for input(s): AST, ALT, ALKPHOS, BILITOT, PROT, ALBUMIN in the last 168 hours.  ABG    Component Value Date/Time   PHART 7.25 (L) 07/06/2021 0951   PCO2ART 46 07/06/2021 0951   PO2ART 57 (L) 07/06/2021 0951   HCO3 20.2 07/06/2021 0951   ACIDBASEDEF 6.7 (H) 07/06/2021 0951   O2SAT 83.9 07/06/2021 0951      Allergies Allergies  Allergen Reactions   Contrast Media  [Iodinated Diagnostic Agents] Anaphylaxis   Iodine Swelling    (IV only) - angioedema    Scheduled Meds:  amLODipine  10 mg Per Tube Daily   chlorhexidine gluconate (MEDLINE KIT)  15 mL Mouth Rinse BID   Chlorhexidine Gluconate Cloth  6 each Topical Q0600   feeding supplement (PROSource TF)  45 mL Per Tube Daily   free water  50 mL Per Tube Q4H   heparin injection (subcutaneous)  5,000 Units Subcutaneous Q8H   insulin aspart  0-20 Units Subcutaneous Q4H   insulin aspart  10 Units Subcutaneous Q4H   insulin detemir  43 Units Subcutaneous BID   mouth rinse  15 mL Mouth Rinse 10 times per day   metoprolol tartrate  25 mg Per Tube BID   multivitamin  1 tablet Per Tube QHS   nutrition supplement (JUVEN)  1 packet Per Tube BID BM   pantoprazole (PROTONIX) IV  40 mg Intravenous Q12H   Continuous Infusions:  sodium chloride     sodium chloride Stopped (07/11/21 0902)   dexmedetomidine (PRECEDEX) IV infusion 0.6 mcg/kg/hr (07/13/21 0700)   feeding supplement (PIVOT 1.5 CAL) 1,000 mL (07/12/21 0302)   fentaNYL infusion INTRAVENOUS Stopped (07/11/21 1141)   fluconazole (DIFLUCAN) IV Stopped (07/12/21 2220)   PRN Meds:.sodium chloride, acetaminophen, alteplase, fentaNYL, fentaNYL  (SUBLIMAZE) injection, heparin, hydrALAZINE, lidocaine (PF), lidocaine-prilocaine, midazolam, ondansetron (ZOFRAN) IV, pentafluoroprop-tetrafluoroeth  Active Hospital Problem list   Acute hypoxic respiratory failure ventilator dependent Severe sepsis with septic shock Bowel perforation AKI on CKD stage IV Right UPJ stone with hydronephrosis Left renal fluid collection Anion gap metabolic acidosis Lactic acidosis Diabetes mellitus A. fib flutter with RVR Candiduria, yeast species being IDed      Assessment & Plan:     75 yo WF Acute Hypoxic Respiratory Failure in setting of Community Acquired Pneumonia,  bilateral pleural effusions with associated atelectasis complicated by  perforated Duodenal Ulcer with peritonitis Status post laparotomy Status post ureteral stent placement Left renal abscess, appears to be resolving Candiduria failure to wean from vent -S/p exploratory laparotomy and primary repair of duodenal ulcer with omental patch on 06/29/21 -S/P bilateral ureteral stent placement 8/10   Severe ACUTE Hypoxic and Hypercapnic Respiratory Failure -continue Mechanical Ventilator support -continue Bronchodilator Therapy -Wean Fio2 and PEEP as tolerated -VAP/VENT bundle implementation -will perform SAT/SBT when respiratory parameters are met Consider one way extubation if not will need TRACH   SEPTIC shock-resolving SOURCE-Severe Sepsis secondary to UTI, Community Acquired Pneumonia and perforated Duodenal Ulcer with peritonitis -use vasopressors to keep MAP>65 as needed  ACUTE SYSTOLIC CARDIAC FAILURE- AFIB WITH RVR -oxygen as needed Continue Cardizem infusion at 10 mg/h Continue metoprolol IV currently 5 mg IV every 6 Avoiding amiodarone due to need for high dose fluconazole (QT prolongation effects) Vent support as needed  ACUTE KIDNEY INJURY/Renal Failure -continue Foley Catheter-assess need -Avoid nephrotoxic agents -Follow urine output, BMP -Ensure adequate  renal perfusion, optimize oxygenation -Renal dose medications Plan for replacement of PERM CATH   Intake/Output Summary (Last 24 hours) at 07/13/2021 0733 Last data filed at 07/13/2021 0700 Gross per 24 hour  Intake 1681.75 ml  Output 2390 ml  Net -708.25 ml    ACUTE ANEMIA- TRANSFUSE AS NEEDED CONSIDER TRANSFUSION  IF HGB<7 DVT PRX with TED/SCD's ONLY  ENDO - ICU hypoglycemic\Hyperglycemia protocol -check FSBS per protocol  ELECTROLYTES -follow labs as needed -replace as needed -pharmacy consultation and following   Best practice:  Diet:  NPO, TPN Pain/Anxiety/Delirium protocol (if indicated): Yes (RASS goal 0) VAP protocol (if indicated): Yes DVT prophylaxis: Heparin gtt GI prophylaxis: PPI Glucose control:  SSI Yes Central venous access:  Yes, and it is still needed Arterial line:  Yes, and it is still needed Foley:  Yes, and it is still needed Mobility:  bed rest  PT consulted: N/A Last date of multidisciplinary goals of care discussion [8/8] Code Status:  full code Disposition: ICU   PLAN FOR SAT/SBT AFTER SESSION OF HD AND ASSESS FOR TRIAL OF EXTUBATION  DVT/GI PRX  assessed I Assessed the need for Labs  I Assessed the need for Foley I Assessed the need for Central Venous Line Family Discussion when available I Assessed the need for Mobilization I made an Assessment of medications to be adjusted accordingly Safety Risk assessment completed  CASE DISCUSSED IN MULTIDISCIPLINARY ROUNDS WITH ICU TEAM     Critical Care Time devoted to patient care services described in this note is 55 minutes.  Critical care was necessary to treat /prevent imminent and life-threatening deterioration. Overall, patient is critically ill, prognosis is guarded.  Patient with Multiorgan failure and at high risk for cardiac arrest and death.    Corrin Parker, M.D.  [facility]  Medical Director [place] Director [facility]  Cardio-Pulmonary Department

## 2021-07-13 NOTE — Progress Notes (Addendum)
Daily Progress Note   Patient Name: Heather Lopez       Date: 07/13/2021 DOB: [birth year]  Age: 75 y.o. MRN#: [id number] Attending Physician: Flora Lipps, MD Primary Care Physician: [name], PA-C Admit Date: 06/27/2021  Reason for Consultation/Follow-up: Establishing goals of care  Subjective: Patient is resting in bed on ventilator. Called to speak with husband. Husband discusses the established plan for extubation and then a tracheostomy if she cannot sustain; he discusses continuing dialysis. Overall plan is to continue to treat the treatable.   He states she would not want to live on a ventilator. Discussed multiple scenarios. Discussed outlining what would be considered an acceptable QOL to his wife. Encouraged him to talk with his family regarding time lines. Would recommend palliative to follow at D/C.    Length of Stay: 16  Current Medications: Scheduled Meds:   amLODipine  10 mg Per Tube Daily   chlorhexidine gluconate (MEDLINE KIT)  15 mL Mouth Rinse BID   Chlorhexidine Gluconate Cloth  6 each Topical Q0600   feeding supplement (PROSource TF)  45 mL Per Tube Daily   free water  50 mL Per Tube Q4H   heparin       heparin injection (subcutaneous)  5,000 Units Subcutaneous Q8H   insulin aspart  0-20 Units Subcutaneous Q4H   insulin aspart  12 Units Subcutaneous Q4H   insulin detemir  46 Units Subcutaneous BID   mouth rinse  15 mL Mouth Rinse 10 times per day   metoprolol tartrate  25 mg Per Tube BID   multivitamin  1 tablet Per Tube QHS   nutrition supplement (JUVEN)  1 packet Per Tube BID BM   pantoprazole (PROTONIX) IV  40 mg Intravenous Q12H    Continuous Infusions:  sodium chloride     dexmedetomidine (PRECEDEX) IV infusion 0.8 mcg/kg/hr (07/13/21 1135)   feeding  supplement (PIVOT 1.5 CAL) 1,000 mL (07/12/21 0302)   fentaNYL infusion INTRAVENOUS Stopped (07/11/21 1141)   fluconazole (DIFLUCAN) IV Stopped (07/12/21 2220)    PRN Meds: sodium chloride, acetaminophen, alteplase, fentaNYL, fentaNYL (SUBLIMAZE) injection, heparin, hydrALAZINE, lidocaine (PF), lidocaine-prilocaine, midazolam, ondansetron (ZOFRAN) IV, pentafluoroprop-tetrafluoroeth  Physical Exam Constitutional:      Comments: Eyes closed.   Pulmonary:     Comments: On ventilator.  Vital Signs: BP (!) 150/50   Pulse 82   Temp 99.1 F (37.3 C) (Axillary)   Resp 20   Ht 5' 2.01" (1.575 m)   Wt 112 kg   SpO2 99%   BMI 45.15 kg/m  SpO2: SpO2: 99 % O2 Device: O2 Device: Ventilator O2 Flow Rate: O2 Flow Rate (L/min): 10 L/min  Intake/output summary:  Intake/Output Summary (Last 24 hours) at 07/13/2021 1312 Last data filed at 07/13/2021 0800 Gross per 24 hour  Intake 1179.99 ml  Output 2990 ml  Net -1810.01 ml   LBM: Last BM Date: 07/13/21 Baseline Weight: Weight: 99.8 kg Most recent weight: Weight: 112 kg         Patient Active Problem List   Diagnosis Date Noted   Demand ischemia of myocardium (HCC)    Atrial flutter with rapid ventricular response ([place]) 07/07/2021   Endotracheally intubated 07/07/2021   Bilateral nephrolithiasis 07/07/2021   Anemia of chronic disease 07/07/2021   Pressure injury of skin 06/30/2021   Pneumoperitoneum    Acute respiratory failure with hypoxia (HCC) 06/27/2021   Severe sepsis with septic shock ([place]) 06/27/2021   Acute lower UTI 06/27/2021   Acute kidney injury superimposed on chronic kidney disease ([place]) 06/27/2021   Non-proliferative diabetic retinopathy, mild, both eyes ([place]) 03/03/2018   Vitamin D deficiency 08/21/2016   IBS (irritable bowel syndrome) 08/21/2016   Special screening for malignant neoplasms, colon    Benign neoplasm of transverse colon    Benign neoplasm of sigmoid colon    Uncontrolled type 2 diabetes  mellitus with insulin therapy ([place]) 07/12/2015   Benign hypertension with CKD (chronic kidney disease) stage III ([place]) 07/12/2015   Hyperlipidemia associated with type 2 diabetes mellitus ([place]) 07/12/2015   Depression 07/12/2015   BMI 40.0-44.9, adult ([place]) 07/12/2015    Palliative Care Assessment & Plan  Assessment: Patient is resting in bed on ventilator, as she was initally admitted for community aquired PNA with pleural effusions; she has been unable to wean from vent. She is 11 days s/p exlap with primary repair of duodenal perforation with omental patch, and drain placement. She is 6 days s/p bilateral ureteral stent placement. She remains on anti fungals for candida found in renal urine samples acquired intraoperatively; left upper pole mass is smaller. Poor QOL 2/2 pain and poor mobility prior to this admission.   Recommendations/Plan: Current plans for attempted extubation and a trach if unsuccessfully extubated. Plans to continue dialysis and treating the treatable. Recommend palliative on D/C.     Code Status:    Code Status Orders  (From admission, onward)           Start     Ordered   07/12/21 1523  Do not attempt resuscitation (DNR)  Continuous       Question Answer Comment  In the event of cardiac or respiratory ARREST Do not call a "code blue"   In the event of cardiac or respiratory ARREST Do not perform Intubation, CPR, defibrillation or ACLS   In the event of cardiac or respiratory ARREST Use medication by any route, position, wound care, and other measures to relive pain and suffering. May use oxygen, suction and manual treatment of airway obstruction as needed for comfort.      07/12/21 1523           Code Status History     Date Active Date Inactive Code Status Order ID Comments User Context   06/27/2021 0445 07/12/2021 1523 Full Code [id number]  Rise Patience, MD ED      Advance Directive Documentation    Flowsheet Row Most Recent Value   Type of Advance Directive Healthcare Power of Attorney  Pre-existing out of facility DNR order (yellow form or pink MOST form) --  "MOST" Form in Place? --       Prognosis:  Unable to determine    Care plan was discussed with CCM  Thank you for allowing the Palliative Medicine Team to assist in the care of this patient.       Total Time 35 min Prolonged Time Billed  no       Greater than 50%  of this time was spent counseling and coordinating care related to the above assessment and plan.  Asencion Gowda, NP  Please contact Palliative Medicine Team phone at [phone] for questions and concerns.

## 2021-07-13 NOTE — Op Note (Signed)
OPERATIVE NOTE    PRE-OPERATIVE DIAGNOSIS: 1. ESRD 2. Non-functional permcath  POST-OPERATIVE DIAGNOSIS: same as above  PROCEDURE: Fluoroscopic guidance for placement of catheter Placement of a 23 cm tip to cuff tunneled hemodialysis catheter via the left internal jugular vein and removal of previous catheter  SURGEON: Leotis Pain, MD  ANESTHESIA:  Local with moderate conscious sedation using her existing Precedex drip  ESTIMATED BLOOD LOSS: 2 cc  FINDING(S): none  SPECIMEN(S):  None  INDICATIONS:   Patient is a 75 y.o.female who presents with non-functional dialysis catheter and ESRD.  The patient needs long term dialysis access for their ESRD, and a Permcath is necessary.  Risks and benefits are discussed and informed consent is obtained.    DESCRIPTION: After obtaining full informed written consent, the patient was brought back to the vascular suite. The patient received moderate conscious sedation during a face-to-face encounter with me present throughout the entire procedure and supervising the RN monitoring the vital signs, pulse oximetry, telemetry, and mental status throughout the entire procedure. The patient's existing catheter, left neck and chest were sterilely prepped and draped in a sterile surgical field was created.  The existing catheter was dissected free from the fibrous sheath securing the cuff with hemostats and blunt dissection.  A wire was placed. The existing catheter was then removed and the wire used to keep venous access. I selected a 23 cm tip to cuff tunneled dialysis catheter.  Using fluoroscopic guidance the catheter tips were parked in the right atrium. The appropriate distal connectors were placed. It withdrew blood well and flushed easily with heparinized saline and a concentrated heparin solution was then placed. It was secured to the chest wall with 2 Prolene sutures. A 4-0 Monocryl pursestring suture was placed around the exit site. Sterile dressings  were placed. The patient tolerated the procedure well and was taken to the recovery room in stable condition.  COMPLICATIONS: None  CONDITION: Stable  Leotis Pain 07/13/2021 11:58 AM   This note was created with Dragon Medical transcription system. Any errors in dictation are purely unintentional.

## 2021-07-13 NOTE — Progress Notes (Deleted)
[facility]  ROUNDING NOTE   Subjective:   Heather Lopez is a 75 y.o. white female who has been admitted for community acquired pneumonia.    Hospital course complicated by duodenal perforation s/p repair on 06/30/2021 ARF - requiring HD PRN Acute resp failure requiring mech ventilation Left obstructive hydronephrosis and renal abscess, Patient underwent cystoscopy and b/l ureteral stent placement 8/10  Critically ill Intubated and sedated on ventilator, failed weaning trials Foley with clear yellow urine UOP 2.2L in past 24 hours BUN 106   Objective:  Vital signs in last 24 hours:  Temp:  [98.5 F (36.9 C)-100.6 F (38.1 C)] 99.1 F (37.3 C) (08/18 0800) Pulse Rate:  [75-128] 82 (08/18 1240) Resp:  [12-38] 20 (08/18 1240) BP: (100-211)/(42-97) 150/50 (08/18 1240) SpO2:  [96 %-100 %] 99 % (08/18 1240) FiO2 (%):  [24 %-30 %] 24 % (08/18 1204) Weight:  [528 kg] 112 kg (08/18 0500)  Weight change: 4.9 kg Filed Weights   07/10/21 0355 07/12/21 0436 07/13/21 0500  Weight: 105.5 kg 107.1 kg 112 kg    Intake/Output: I/O last 3 completed shifts: In: 2727 [I.V.:541.9; Other:95; NG/GT:1690; IV Piggyback:400.1] Out: 4330 [Urine:3900; Drains:280; Stool:150]   Intake/Output this shift:  Total I/O In: -  Out: 1900 [Urine:1000; Stool:900]  Physical Exam: General: Critically ill  Head: ETT, OGT  Eyes: Anicteric  Lungs:  Vent assisted  Heart: Regular rate and rhythm  Abdomen:  +JP drains  Extremities:  trace peripheral edema.dependent BUE  Neurologic:  sedated  Skin: No lesions  Access:  RIJ temp cath  Left IJ PermCath-nonfunctional  Basic Metabolic Panel: Recent Labs  Lab 07/10/21 0503 07/11/21 0453 07/11/21 1510 07/12/21 0501 07/13/21 0435  NA 141 141 140 142 146*  K 3.3* 2.9* 3.7 3.4* 3.5  CL 112* 107 106 107 113*  CO2 18* '26 24 25 26  ' GLUCOSE 256* 183* 197* 185* 235*  BUN 131* 79* 81* 84* 106*  CREATININE 3.35* 2.12* 2.17* 2.17* 2.45*  CALCIUM  7.8* 7.8* 8.3* 8.4* 8.3*  MG 2.0 1.7 1.7 1.7 2.0  PHOS 3.9 3.3 3.2 2.7 3.1     Liver Function Tests: Recent Labs  Lab 07/13/21 0500  AST 52*  ALT 114*  ALKPHOS 107  BILITOT 0.5  PROT 5.3*  ALBUMIN 1.7*    No results for input(s): LIPASE, AMYLASE in the last 168 hours. No results for input(s): AMMONIA in the last 168 hours.  CBC: Recent Labs  Lab 07/09/21 0430 07/10/21 0503 07/11/21 0453 07/11/21 1510 07/12/21 0501 07/13/21 0435  WBC 12.8* 10.5 7.5  --  7.5 5.6  HGB 7.7* 7.1* 6.5* 8.6* 8.1* 7.5*  HCT 24.4* 22.3* 20.0* 26.3* 24.7* 23.2*  MCV 87.5 86.4 87.3  --  84.6 86.9  PLT 203 171 143*  --  162 180     Cardiac Enzymes: No results for input(s): CKTOTAL, CKMB, CKMBINDEX, TROPONINI in the last 168 hours.  BNP: Invalid input(s): POCBNP  CBG: Recent Labs  Lab 07/12/21 2003 07/12/21 2307 07/13/21 0429 07/13/21 0752 07/13/21 1205  GLUCAP 325* 265* 215* 151* 133*     Microbiology: Results for orders placed or performed during the hospital encounter of 06/27/21  Resp Panel by RT-PCR (Flu A&B, Covid) Nasopharyngeal Swab     Status: None   Collection Time: 06/27/21 12:32 AM   Specimen: Nasopharyngeal Swab; Nasopharyngeal(NP) swabs in vial transport medium  Result Value Ref Range Status   SARS Coronavirus 2 by RT PCR NEGATIVE NEGATIVE Final  Comment: (NOTE) SARS-CoV-2 target nucleic acids are NOT DETECTED.  The SARS-CoV-2 RNA is generally detectable in upper respiratory specimens during the acute phase of infection. The lowest concentration of SARS-CoV-2 viral copies this assay can detect is 138 copies/mL. A negative result does not preclude SARS-Cov-2 infection and should not be used as the sole basis for treatment or other patient management decisions. A negative result may occur with  improper specimen collection/handling, submission of specimen other than nasopharyngeal swab, presence of viral mutation(s) within the areas targeted by this assay,  and inadequate number of viral copies(<138 copies/mL). A negative result must be combined with clinical observations, patient history, and epidemiological information. The expected result is Negative.  Fact Sheet for Patients:  EntrepreneurPulse.com.au  Fact Sheet for Healthcare Providers:  IncredibleEmployment.be  This test is no t yet approved or cleared by the [place] FDA and  has been authorized for detection and/or diagnosis of SARS-CoV-2 by FDA under an Emergency Use Authorization (EUA). This EUA will remain  in effect (meaning this test can be used) for the duration of the COVID-19 declaration under Section 564(b)(1) of the Act, 21 U.S.C.section 360bbb-3(b)(1), unless the authorization is terminated  or revoked sooner.       Influenza A by PCR NEGATIVE NEGATIVE Final   Influenza B by PCR NEGATIVE NEGATIVE Final    Comment: (NOTE) The Xpert Xpress SARS-CoV-2/FLU/RSV plus assay is intended as an aid in the diagnosis of influenza from Nasopharyngeal swab specimens and should not be used as a sole basis for treatment. Nasal washings and aspirates are unacceptable for Xpert Xpress SARS-CoV-2/FLU/RSV testing.  Fact Sheet for Patients: EntrepreneurPulse.com.au  Fact Sheet for Healthcare Providers: IncredibleEmployment.be  This test is not yet approved or cleared by the [place] FDA and has been authorized for detection and/or diagnosis of SARS-CoV-2 by FDA under an Emergency Use Authorization (EUA). This EUA will remain in effect (meaning this test can be used) for the duration of the COVID-19 declaration under Section 564(b)(1) of the Act, 21 U.S.C. section 360bbb-3(b)(1), unless the authorization is terminated or revoked.  Performed at [facility], [address] [place], [place] [address]   Blood culture (routine x 2)     Status: None   Collection Time: 06/27/21 12:43 AM    Specimen: BLOOD  Result Value Ref Range Status   Specimen Description BLOOD RIGHT ASSIST CONTROL  Final   Special Requests   Final    BOTTLES DRAWN AEROBIC AND ANAEROBIC Blood Culture results may not be optimal due to an inadequate volume of blood received in culture bottles   Culture   Final    NO GROWTH 5 DAYS Performed at [facility], [address] [place], [place] [address]    Report Status 07/02/2021 FINAL  Final  Blood culture (routine x 2)     Status: None   Collection Time: 06/27/21  1:45 AM   Specimen: BLOOD  Result Value Ref Range Status   Specimen Description BLOOD RIGHT ASSIST CONTROL  Final   Special Requests   Final    BOTTLES DRAWN AEROBIC AND ANAEROBIC Blood Culture adequate volume   Culture   Final    NO GROWTH 5 DAYS Performed at [facility], [address], [place], [place] [address]    Report Status 07/02/2021 FINAL  Final  Urine Culture     Status: Abnormal   Collection Time: 06/27/21  2:39 AM   Specimen: In/Out Cath Urine  Result Value Ref Range Status  Specimen Description   Final    IN/OUT CATH URINE Performed at [facility], [address] [place], [place] [address]    Special Requests   Final    NONE Performed at [facility], [place], [place] [address]    Culture 30,000 COLONIES/mL YEAST (A)  Final   Report Status 06/28/2021 FINAL  Final  MRSA Next Gen by PCR, Nasal     Status: None   Collection Time: 06/30/21  5:01 AM   Specimen: Nasal Mucosa; Nasal Swab  Result Value Ref Range Status   MRSA by PCR Next Gen NOT DETECTED NOT DETECTED Final    Comment: (NOTE) The GeneXpert MRSA Assay (FDA approved for NASAL specimens only), is one component of a comprehensive MRSA colonization surveillance program. It is not intended to diagnose MRSA infection nor to guide or monitor treatment for MRSA infections. Test performance is not FDA approved in patients less than 60 years old. Performed at  [facility], [address] [place], [place] [address]   Urine Culture     Status: Abnormal   Collection Time: 07/05/21 10:36 AM   Specimen: PATH Other; Urine  Result Value Ref Range Status   Specimen Description   Final    URINE, RANDOM LEFT RENAL PELVIS URINE Performed at [facility], [address], [place], [place] [address]    Special Requests   Final    NONE Performed at [facility], [address] [place], [place] [address]    Culture 70,000 COLONIES/mL CANDIDA GLABRATA (A)  Final   Report Status 07/12/2021 FINAL  Final  Urine Culture     Status: Abnormal (Preliminary result)   Collection Time: 07/05/21 10:48 AM   Specimen: PATH Other; Urine  Result Value Ref Range Status   Specimen Description   Final    URINE, RANDOM RIGHT KIDNEY URINE Performed at [facility], [address], [place], [place] [address]    Special Requests   Final    NONE Performed at [facility], [address] [place], [place] [address]    Culture (A)  Final    >=100,000 COLONIES/mL CANDIDA GLABRATA Sent to [place] for further susceptibility testing. Performed at [facility], [place] [address] [place],  [address]    Report Status PENDING  Incomplete    Coagulation Studies: No results for input(s): LABPROT, INR in the last 72 hours.   Urinalysis: No results for input(s): COLORURINE, LABSPEC, PHURINE, GLUCOSEU, HGBUR, BILIRUBINUR, KETONESUR, PROTEINUR, UROBILINOGEN, NITRITE, LEUKOCYTESUR in the last 72 hours.  Invalid input(s): APPERANCEUR     Imaging: CT ABDOMEN PELVIS WO CONTRAST  Result Date: 07/12/2021 CLINICAL DATA:  Fever, sacral ulcer. EXAM: CT ABDOMEN AND PELVIS WITHOUT CONTRAST TECHNIQUE: Multidetector CT imaging of the abdomen and pelvis was performed following the standard protocol without IV contrast. COMPARISON:  July 07, 2021. FINDINGS: Lower chest: Mild bilateral posterior basilar subsegmental atelectasis  or infiltrates are noted. Possible small left pleural effusion is noted. Hepatobiliary: No focal liver abnormality is seen. No gallstones, gallbladder wall thickening, or biliary dilatation. Pancreas: Unremarkable. No pancreatic ductal dilatation or surrounding inflammatory changes. Spleen: Normal in size without focal abnormality. Adrenals/Urinary Tract: Adrenal glands appear normal. Bilateral ureteral stents are again noted. Urinary bladder is decompressed secondary to Foley catheter. No definite hydronephrosis is noted. 7.8 x 3.6 cm fluid collection is seen anterior to upper pole of left kidney which is not significantly changed in size based on my own measurements  of prior study. Bilateral nonobstructive nephrolithiasis is again noted. Stomach/Bowel: Rectal tube is noted. Nasogastric tube tip is seen in proximal stomach. No definite evidence of bowel obstruction or inflammation is noted. Vascular/Lymphatic: Aortic atherosclerosis. Stable retroperitoneal lymph nodes are noted which most likely reactive in etiology. Reproductive: Possible small uterine fibroids. No adnexal abnormality is noted. Other: Grossly stable position of surgical drain entering right lower quadrant with distal tip in central pelvis. Stable position of surgical drain entering right upper quadrant and passing underneath right hepatic lobe with distal tip inferior to left hepatic lobe. No definite fluid collections are noted. Musculoskeletal: No acute or significant osseous findings. IMPRESSION: Mild bilateral posterior basilar subsegmental atelectasis or infiltrates with possible small left pleural effusion. Stable bilateral ureteral stents are noted without definite hydronephrosis. Grossly stable 7.8 x 3.6 cm fluid collection is seen anterior to upper pole of left kidney. Bilateral nonobstructive nephrolithiasis. Grossly stable position of 2 surgical drains as described above. No definite fluid collection is noted around these catheters.  Aortic Atherosclerosis (ICD10-I70.0). Electronically Signed   By: Marijo Conception M.D.   On: 07/12/2021 19:32   PERIPHERAL VASCULAR CATHETERIZATION  Result Date: 07/13/2021 See surgical note for result.  DG Chest Port 1 View  Result Date: 07/13/2021 CLINICAL DATA:  Acute respiratory failure with hypoxia EXAM: PORTABLE CHEST 1 VIEW COMPARISON:  07/10/2021 FINDINGS: Endotracheal tube, enteric tube, bilateral IJ approach central lines are unchanged. Linear atelectasis bilaterally. Similar pulmonary vascular congestion. Cardiomediastinal contours are stable. No pneumothorax or significant pleural effusion. IMPRESSION: Unchanged lines and tubes. Similar pulmonary vascular congestion and bilateral patchy linear atelectasis. Electronically Signed   By: Macy Mis M.D.   On: 07/13/2021 08:09     Medications:    sodium chloride     dexmedetomidine (PRECEDEX) IV infusion 0.8 mcg/kg/hr (07/13/21 1135)   feeding supplement (PIVOT 1.5 CAL) 1,000 mL (07/12/21 0302)   fentaNYL infusion INTRAVENOUS Stopped (07/11/21 1141)   fluconazole (DIFLUCAN) IV Stopped (07/12/21 2220)    amLODipine  10 mg Per Tube Daily   chlorhexidine gluconate (MEDLINE KIT)  15 mL Mouth Rinse BID   Chlorhexidine Gluconate Cloth  6 each Topical Q0600   feeding supplement (PROSource TF)  45 mL Per Tube Daily   free water  50 mL Per Tube Q4H   heparin       heparin injection (subcutaneous)  5,000 Units Subcutaneous Q8H   insulin aspart  0-20 Units Subcutaneous Q4H   insulin aspart  12 Units Subcutaneous Q4H   insulin detemir  46 Units Subcutaneous BID   mouth rinse  15 mL Mouth Rinse 10 times per day   metoprolol tartrate  25 mg Per Tube BID   multivitamin  1 tablet Per Tube QHS   nutrition supplement (JUVEN)  1 packet Per Tube BID BM   pantoprazole (PROTONIX) IV  40 mg Intravenous Q12H     Assessment/ Plan:  Heather Lopez is a 75 y.o.  female who has been admitted for community acquired pneumonia.    Acute Kidney  Injury on chronic kidney disease stage 4 with baseline creatinine 1.97 and GFR of 25 on 12/22/19.  Acute kidney injury secondary to sepsis and obstructive uropathy Chronic kidney disease is secondary to diabetic nephropathy Continue to hold losartan and metformin Creatinine increasing with adequate UOP BUN increased to 106. Will dialyze today, no UF Will continue to assess need for dialysis dialy Continue supportive care, Palliative care consulted Avoid nephrotoxins  Lab Results  Component Value Date  CREATININE 2.45 (H) 07/13/2021   CREATININE 2.17 (H) 07/12/2021   CREATININE 2.17 (H) 07/11/2021    Intake/Output Summary (Last 24 hours) at 07/13/2021 1335 Last data filed at 07/13/2021 0800 Gross per 24 hour  Intake 1179.99 ml  Output 2990 ml  Net -1810.01 ml    2. Acute Respiratory failure secondary to community acquired pneumonia failing outpatient antibiotics.  Mechanical ventilation Weaning trials have failed. Considering trach placement  3. Anemia of chronic kidney disease Normocytic Lab Results  Component Value Date   HGB 7.5 (L) 07/13/2021   Low threshold to initiate ESA EPO with this treatment 1 unit RBCs transfused yesterday  4.  Diabetes mellitus type II with chronic kidney disease insulin dependent. Most recent hemoglobin A1c is 8.3 on 06/12/21.  Metformin held, Stable   5.Bilateral Nephrolithiasis- with obstructive uropathy and left renal abscess Right - Mild to moderate chronic hydronephrosis without obstructing stone Left-mild left hydronephrosis with 14 mm stone at left UPJ- b/l ureteral stent placed 8/10 Lesion at upper pole of left kidney enlarged - likely hematoma or abscess  6. S/p  duodenal perforation s/p repair on 06/30/2021 Tube feeds stopped  7. Hypernatremia - free water deficit - increase free water supplement - 200 q 3 hrs Stable       LOS: 16   8/18/20221:35 PM

## 2021-07-13 NOTE — Progress Notes (Signed)
Pt transported to vascular and back to ccu on vent with no issues

## 2021-07-14 ENCOUNTER — Encounter: Payer: Self-pay | Admitting: Vascular Surgery

## 2021-07-14 DIAGNOSIS — N189 Chronic kidney disease, unspecified: Secondary | ICD-10-CM | POA: Diagnosis not present

## 2021-07-14 DIAGNOSIS — N200 Calculus of kidney: Secondary | ICD-10-CM | POA: Diagnosis not present

## 2021-07-14 DIAGNOSIS — N390 Urinary tract infection, site not specified: Secondary | ICD-10-CM | POA: Diagnosis not present

## 2021-07-14 DIAGNOSIS — J9601 Acute respiratory failure with hypoxia: Secondary | ICD-10-CM | POA: Diagnosis not present

## 2021-07-14 DIAGNOSIS — N179 Acute kidney failure, unspecified: Secondary | ICD-10-CM | POA: Diagnosis not present

## 2021-07-14 DIAGNOSIS — N170 Acute kidney failure with tubular necrosis: Secondary | ICD-10-CM | POA: Diagnosis not present

## 2021-07-14 LAB — CBC
HCT: 24.2 % — ABNORMAL LOW (ref 36.0–46.0)
Hemoglobin: 7.7 g/dL — ABNORMAL LOW (ref 12.0–15.0)
MCH: 27.5 pg (ref 26.0–34.0)
MCHC: 31.8 g/dL (ref 30.0–36.0)
MCV: 86.4 fL (ref 80.0–100.0)
Platelets: 186 10*3/uL (ref 150–400)
RBC: 2.8 MIL/uL — ABNORMAL LOW (ref 3.87–5.11)
RDW: 17.2 % — ABNORMAL HIGH (ref 11.5–15.5)
WBC: 5 10*3/uL (ref 4.0–10.5)
nRBC: 0 % (ref 0.0–0.2)

## 2021-07-14 LAB — GLUCOSE, CAPILLARY
Glucose-Capillary: 134 mg/dL — ABNORMAL HIGH (ref 70–99)
Glucose-Capillary: 117 mg/dL — ABNORMAL HIGH (ref 70–99)
Glucose-Capillary: 168 mg/dL — ABNORMAL HIGH (ref 70–99)
Glucose-Capillary: 188 mg/dL — ABNORMAL HIGH (ref 70–99)
Glucose-Capillary: 246 mg/dL — ABNORMAL HIGH (ref 70–99)
Glucose-Capillary: 256 mg/dL — ABNORMAL HIGH (ref 70–99)

## 2021-07-14 LAB — BASIC METABOLIC PANEL
Anion gap: 10 (ref 5–15)
BUN: 53 mg/dL — ABNORMAL HIGH (ref 8–23)
CO2: 29 mmol/L (ref 22–32)
Calcium: 8.1 mg/dL — ABNORMAL LOW (ref 8.9–10.3)
Chloride: 104 mmol/L (ref 98–111)
Creatinine, Ser: 1.63 mg/dL — ABNORMAL HIGH (ref 0.44–1.00)
GFR, Estimated: 33 mL/min — ABNORMAL LOW (ref >60)
Glucose, Bld: 139 mg/dL — ABNORMAL HIGH (ref 70–99)
Potassium: 3.4 mmol/L — ABNORMAL LOW (ref 3.5–5.1)
Sodium: 143 mmol/L (ref 135–145)

## 2021-07-14 MED ORDER — OXYCODONE HCL 5 MG PO TABS
5.0000 mg | ORAL_TABLET | Freq: Four times a day (QID) | ORAL | Status: DC
  Administered 2021-07-14: 10 mg
  Administered 2021-07-15: 5 mg
  Administered 2021-07-15: 10 mg
  Administered 2021-07-15 (×2): 5 mg
  Administered 2021-07-16 (×2): 10 mg
  Filled 2021-07-14 (×2): qty 2
  Filled 2021-07-14: qty 1
  Filled 2021-07-14: qty 2
  Filled 2021-07-14 (×2): qty 1
  Filled 2021-07-14: qty 2

## 2021-07-14 NOTE — Progress Notes (Signed)
Date of Admission:  06/27/2021    ID: Heather Lopez is a 75 y.o. female  Active Problems:   Uncontrolled type 2 diabetes mellitus with insulin therapy ([place])   Hyperlipidemia associated with type 2 diabetes mellitus ([place])   Acute respiratory failure with hypoxia ([facility])   Severe sepsis with septic shock ([place])   Acute lower UTI   Acute kidney injury superimposed on chronic kidney disease ([place])   Pneumoperitoneum   Pressure injury of skin   Atrial flutter with rapid ventricular response ([place])   Endotracheally intubated   Bilateral nephrolithiasis   Anemia of chronic disease   Demand ischemia of myocardium (H[facility])    Subjective: Patient was extubated today On BiPAP  Medications:   amLODipine  10 mg Per Tube Daily   chlorhexidine gluconate (MEDLINE KIT)  15 mL Mouth Rinse BID   Chlorhexidine Gluconate Cloth  6 each Topical Q0600   feeding supplement (PROSource TF)  45 mL Per Tube Daily   free water  50 mL Per Tube Q4H   heparin injection (subcutaneous)  5,000 Units Subcutaneous Q8H   insulin aspart  0-20 Units Subcutaneous Q4H   insulin aspart  12 Units Subcutaneous Q4H   insulin detemir  46 Units Subcutaneous BID   mouth rinse  15 mL Mouth Rinse 10 times per day   metoprolol tartrate  25 mg Per Tube BID   multivitamin  1 tablet Per Tube QHS   nutrition supplement (JUVEN)  1 packet Per Tube BID BM   pantoprazole (PROTONIX) IV  40 mg Intravenous Q12H    Objective: Vital signs in last 24 hours: Temp:  [98.5 F (36.9 C)-99.3 F (37.4 C)] 98.5 F (36.9 C) (08/19 0800) Pulse Rate:  [78-102] 88 (08/19 1100) Resp:  [19-28] 27 (08/19 1100) BP: (131-193)/(52-75) 168/60 (08/19 1100) SpO2:  [97 %-100 %] 97 % (08/19 1100) FiO2 (%):  [24 %] 24 % (08/19 0833) Weight:  [801 kg] 114 kg (08/19 0500)  PHYSICAL EXAM:  General: Extubated on BiPAP  head: Normocephalic, without obvious abnormality, atraumatic. Eyes: Conjunctivae clear, anicteric sclerae. Pupils are equal ENT not  examined Neck: Left IJ hemodialysis catheter. Lungs: b/l air entry Heart: irregular Abdomen: Surgical incision site clean, no discharge, no erythema.  2 JP drains. Foley catheter Extremities: atraumatic, no cyanosis. No edema. No clubbing Skin: No rashes or lesions. Or bruising Lymph: Cervical, supraclavicular normal. Neurologic: Cannot assess  Lab Results Recent Labs    07/13/21 0435 07/14/21 0429  WBC 5.6 5.0  HGB 7.5* 7.7*  HCT 23.2* 24.2*  NA 146* 143  K 3.5 3.4*  CL 113* 104  CO2 26 29  BUN 106* 53*  CREATININE 2.45* 1.63*   Liver Panel Recent Labs    07/13/21 0500  PROT 5.3*  ALBUMIN 1.7*  AST 52*  ALT 114*  ALKPHOS 107  BILITOT 0.5  BILIDIR <0.1  IBILI NOT CALCULATED   Studies/Results: CT ABDOMEN PELVIS WO CONTRAST  Result Date: 07/12/2021 CLINICAL DATA:  Fever, sacral ulcer. EXAM: CT ABDOMEN AND PELVIS WITHOUT CONTRAST TECHNIQUE: Multidetector CT imaging of the abdomen and pelvis was performed following the standard protocol without IV contrast. COMPARISON:  July 07, 2021. FINDINGS: Lower chest: Mild bilateral posterior basilar subsegmental atelectasis or infiltrates are noted. Possible small left pleural effusion is noted. Hepatobiliary: No focal liver abnormality is seen. No gallstones, gallbladder wall thickening, or biliary dilatation. Pancreas: Unremarkable. No pancreatic ductal dilatation or surrounding inflammatory changes. Spleen: Normal in size without focal abnormality. Adrenals/Urinary Tract: Adrenal  glands appear normal. Bilateral ureteral stents are again noted. Urinary bladder is decompressed secondary to Foley catheter. No definite hydronephrosis is noted. 7.8 x 3.6 cm fluid collection is seen anterior to upper pole of left kidney which is not significantly changed in size based on my own measurements of prior study. Bilateral nonobstructive nephrolithiasis is again noted. Stomach/Bowel: Rectal tube is noted. Nasogastric tube tip is seen in proximal  stomach. No definite evidence of bowel obstruction or inflammation is noted. Vascular/Lymphatic: Aortic atherosclerosis. Stable retroperitoneal lymph nodes are noted which most likely reactive in etiology. Reproductive: Possible small uterine fibroids. No adnexal abnormality is noted. Other: Grossly stable position of surgical drain entering right lower quadrant with distal tip in central pelvis. Stable position of surgical drain entering right upper quadrant and passing underneath right hepatic lobe with distal tip inferior to left hepatic lobe. No definite fluid collections are noted. Musculoskeletal: No acute or significant osseous findings. IMPRESSION: Mild bilateral posterior basilar subsegmental atelectasis or infiltrates with possible small left pleural effusion. Stable bilateral ureteral stents are noted without definite hydronephrosis. Grossly stable 7.8 x 3.6 cm fluid collection is seen anterior to upper pole of left kidney. Bilateral nonobstructive nephrolithiasis. Grossly stable position of 2 surgical drains as described above. No definite fluid collection is noted around these catheters. Aortic Atherosclerosis (ICD10-I70.0). Electronically Signed   By: Marijo Conception M.D.   On: 07/12/2021 19:32   PERIPHERAL VASCULAR CATHETERIZATION  Result Date: 07/13/2021 See surgical note for result.  DG Chest Port 1 View  Result Date: 07/13/2021 CLINICAL DATA:  Acute respiratory failure with hypoxia EXAM: PORTABLE CHEST 1 VIEW COMPARISON:  07/10/2021 FINDINGS: Endotracheal tube, enteric tube, bilateral IJ approach central lines are unchanged. Linear atelectasis bilaterally. Similar pulmonary vascular congestion. Cardiomediastinal contours are stable. No pneumothorax or significant pleural effusion. IMPRESSION: Unchanged lines and tubes. Similar pulmonary vascular congestion and bilateral patchy linear atelectasis. Electronically Signed   By: Macy Mis M.D.   On: 07/13/2021 08:09      Assessment/Plan:  Patient admitted with shortness of breath and was treated like pneumonia.  She then developed abdominal pain found to have free air in abdomen.  Taken for lap and had duodenal perforation.  Closure of perforation on 06/30/2021. Has 2P JP drains Completed Zosyn  AKI on hemodialysis  Bilateral nephrolithiasis.  Bilateral stent placement. Left hydronephrosis Candida pyelonephritis bilateral Urine culture positive for Candida glabrata. Continue high dose fluconazole discussed with IR - recommend ultrasound as contrast CT cannot be done to look for the size of the collection  Anemia Received blood transfusion Heparin has been switched off  A. fib on diltiazem  Thrombocytopenia has resolved Discussed the management with the care team. ID will follow her peripherally this weekend- call if needed

## 2021-07-14 NOTE — Progress Notes (Addendum)
NAME:  Heather Lopez, MRN:  [id number], DOB:  [birth year], LOS: 75 ADMISSION DATE:  06/27/2021, CONSULTATION DATE:  06/30/21 REFERRING MD:  Hampton Abbot, MD  CHIEF COMPLAINT:  Abdominal Pain   HPI  75 y.o. with significant PMH as below who presented to the ED on 06/27/21 with chief complaints of progressive shortness of breath, productive cough, loss of appetite, nausea, dry heaving fatigue, malaise and generalized body aches. Patient initially went to her PCP on 7/29 with complaints of rhinorrhea, harsh cough, loss of appetite, nausea, fatigue, malaise and generalized body aches x2 days, patient was diagnosed with pneumonia of which she received ceftriaxone 1 mg IM and sent home with Levaquin, prednisone and Tessalon.   Past Medical History  Diabetes mellitus type 2 Anxiety and depression Hypertension Hyperlipidemia Hydronephrosis and recurrent UTI Gout and osteoarthritis Significant Hospital Events   8/2: Admitted to MedSurg unit with sepsis 8/3: Vascular consulted for HD catheter 8/4: Patient developed acute abdomen.  CT abdomen shows viscus perforation.  Patient taken to the OR urgently for exploratory lap.  Patient return to ICU on vent.  PCCM consulted 07/01/21- patient had SBT today for recruitment and chest physiotherapy.  Still has significant tachycardia and hypertension when awake (possible pain related) but mentation is good able to follow verbal communication.  07/02/21-patient has failed SBT today due to hypoxemia.  Reviewed care plan with surgery team.  07/03/21- Lung V/Q scan obtained ~ negative for PE.  Weaning FiO2, currently at 50% 8/9 severe resp failure, remains on vent 8/10 s/p Cystoscopy with Bilateral ureteral stent placement (68F/24 cm) for LEFT OBSTRUCTED KIDNEY STONE 8/11 remains on VENT 8/11 failed SAT/SBT  8/12 started on insulin drip for FSBS >400, developed A. fib with RVR CT abdomen pelvis showed decreasing size of left suprarenal collection 8/13 transitioned off insulin, rate  relatively controlled 8/14 issues with increased heart rate when Cardizem decreased.  Currently at 10 mg/h plus Lopressor IV every 6H, consider transitioning Cardizem to via OG in a.m. 8/15 failing weaning trials 8/16 failed weaning trials discussed with family-likely needs [facility]   Consults:  Nephrology Vascular PCCM General Surgery Urology  Procedures:  8/5: Exploratory Lap  8/10: Cystoscopy, bilateral ureteral stent placement, bilateral retrograde pyelography   Significant Diagnostic Tests:  8/2: Chest Xray>left basilar consolidation or atelectasis and suspected small left pleural effusion 8/2: Renal ultrasound> moderate right hydronephrosis, mild left hydronephrosis new with bilateral nephrolithiasis 8/4: CTA abdomen and pelvis> Extensive pneumoperitoneum compatible with perforated viscus. Gastric perforation is suspected, though bowel wall defect is not definitively identified. Surgical consultation is recommended. 2. Large ill-defined mass arising from the upper pole left kidney consistent with renal cell carcinoma. Dedicated renal MRI may be useful when clinical situation permits. 3. Acute left-sided hydronephrosis due to a 12 mm left UPJ calculus. 4. Chronic right hydronephrosis and hydroureter without clear etiology. 5. Bilateral nonobstructing renal calculi as above. 6. Small volume ascites. 7. Trace bilateral pleural effusions with patchy bibasilar atelectasis. 8/7: Venous US BLE>> negative for DVT 8/8: Lung V/Q Scan>>Negative for pulmonary embolus.  Micro Data:  8/2: SARS-CoV-2 PCR> negative 8/2: Influenza PCR> negative 8/2: Blood culture x2> no growth 8/2: Urine Culture> 30,000 colonies yeast 8/2: Legionella urinary antigen> negative 8/10: 70,000 colonies yeast, ID pend  Antimicrobials:  8/4 Fluconazole> increased to high dose 8/12 8/4 Zosyn>   HPI/INTERVAL CHANGES Patient is arousable on minimal sedation, following commands. PermCath replaced yesterday by  Vascular Surgery. No new issues this morning.  REVIEW OF SYSTEMS  PATIENT IS UNABLE  TO PROVIDE COMPLETE REVIEW OF SYSTEMS DUE TO SEVERE CRITICAL ILLNESS AND TOXIC METABOLIC ENCEPHALOPATHY  PHYSICAL EXAMINATION:  GENERAL:critically ill appearing, sedated on Precedex EYES: Pupils equal, round, reactive to light.  No scleral icterus.  MOUTH: Moist mucosal membrane. INTUBATED NECK: Supple.  PULMONARY: CTA b/l, no W/C/R CARDIOVASCULAR: S1 and S2.  No murmurs  GASTROINTESTINAL: mildly distended, Midline incision with dressing dry and intact. JP Drains x 2 MUSCULOSKELETAL: trace pitting edema NEUROLOGIC: obtunded SKIN:intact,warm,dry    OBJECTIVE  Blood pressure (!) 155/67, pulse 88, temperature 98.9 F (37.2 C), temperature source Oral, resp. rate (!) 25, height 5' 2.01" (1.575 m), weight 114 kg, SpO2 100 %.    Vent Mode: PRVC FiO2 (%):  [24 %] 24 % Set Rate:  [12 bmp] 12 bmp Vt Set:  [480 mL] 480 mL PEEP:  [5 cmH20-8 cmH20] 5 cmH20 Plateau Pressure:  [15 cmH20] 15 cmH20   Intake/Output Summary (Last 24 hours) at 07/14/2021 0815 Last data filed at 07/14/2021 0500 Gross per 24 hour  Intake 535.59 ml  Output 2340 ml  Net -1804.41 ml   Filed Weights   07/12/21 0436 07/13/21 0500 07/14/21 0500  Weight: 107.1 kg 112 kg 114 kg      Labs   CBC: Recent Labs  Lab 07/10/21 0503 07/11/21 0453 07/11/21 1510 07/12/21 0501 07/13/21 0435 07/14/21 0429  WBC 10.5 7.5  --  7.5 5.6 5.0  HGB 7.1* 6.5* 8.6* 8.1* 7.5* 7.7*  HCT 22.3* 20.0* 26.3* 24.7* 23.2* 24.2*  MCV 86.4 87.3  --  84.6 86.9 86.4  PLT 171 143*  --  162 180 407    Basic Metabolic Panel: Recent Labs  Lab 07/10/21 0503 07/11/21 0453 07/11/21 1510 07/12/21 0501 07/13/21 0435 07/14/21 0429  NA 141 141 140 142 146* 143  K 3.3* 2.9* 3.7 3.4* 3.5 3.4*  CL 112* 107 106 107 113* 104  CO2 18* '26 24 25 26 29  ' GLUCOSE 256* 183* 197* 185* 235* 139*  BUN 131* 79* 81* 84* 106* 53*  CREATININE 3.35* 2.12* 2.17* 2.17*  2.45* 1.63*  CALCIUM 7.8* 7.8* 8.3* 8.4* 8.3* 8.1*  MG 2.0 1.7 1.7 1.7 2.0  --   PHOS 3.9 3.3 3.2 2.7 3.1  --    GFR: Estimated Creatinine Clearance: 36.2 mL/min (A) (by C-G formula based on SCr of 1.63 mg/dL (H)). Recent Labs  Lab 07/11/21 0453 07/12/21 0501 07/13/21 0435 07/14/21 0429  WBC 7.5 7.5 5.6 5.0    Liver Function Tests: Recent Labs  Lab 07/13/21 0500  AST 52*  ALT 114*  ALKPHOS 107  BILITOT 0.5  PROT 5.3*  ALBUMIN 1.7*   ABG    Component Value Date/Time   PHART 7.25 (L) 07/06/2021 0951   PCO2ART 46 07/06/2021 0951   PO2ART 57 (L) 07/06/2021 0951   HCO3 20.2 07/06/2021 0951   ACIDBASEDEF 6.7 (H) 07/06/2021 0951   O2SAT 83.9 07/06/2021 0951      Allergies Allergies  Allergen Reactions   Contrast Media  [Iodinated Diagnostic Agents] Anaphylaxis   Iodine Swelling    (IV only) - angioedema    Scheduled Meds:  amLODipine  10 mg Per Tube Daily   chlorhexidine gluconate (MEDLINE KIT)  15 mL Mouth Rinse BID   Chlorhexidine Gluconate Cloth  6 each Topical Q0600   feeding supplement (PROSource TF)  45 mL Per Tube Daily   free water  50 mL Per Tube Q4H   heparin injection (subcutaneous)  5,000 Units Subcutaneous Q8H   insulin aspart  0-20 Units Subcutaneous Q4H   insulin aspart  12 Units Subcutaneous Q4H   insulin detemir  46 Units Subcutaneous BID   mouth rinse  15 mL Mouth Rinse 10 times per day   metoprolol tartrate  25 mg Per Tube BID   multivitamin  1 tablet Per Tube QHS   nutrition supplement (JUVEN)  1 packet Per Tube BID BM   pantoprazole (PROTONIX) IV  40 mg Intravenous Q12H   Continuous Infusions:  sodium chloride     dexmedetomidine (PRECEDEX) IV infusion 0.6 mcg/kg/hr (07/14/21 0600)   feeding supplement (PIVOT 1.5 CAL) 1,000 mL (07/12/21 0302)   fentaNYL infusion INTRAVENOUS Stopped (07/11/21 1141)   fluconazole (DIFLUCAN) IV Stopped (07/13/21 2320)   PRN Meds:.sodium chloride, acetaminophen, alteplase, fentaNYL, fentaNYL (SUBLIMAZE)  injection, heparin, hydrALAZINE, lidocaine (PF), lidocaine-prilocaine, midazolam, ondansetron (ZOFRAN) IV, pentafluoroprop-tetrafluoroeth  Active Hospital Problem list   Acute hypoxic respiratory failure ventilator dependent Severe sepsis with septic shock, resolved Bowel perforation s/p ex-lap AKI on CKD stage IV > iHD Right UPJ stone with hydronephrosis s/p stent placement Left renal abscess Anion gap metabolic acidosis, resolved Lactic acidosis, resolved Diabetes mellitus A. fib flutter with RVR, resolved Candida glabrata pyelonephrosis   Assessment & Plan:   75 y.o. F Acute Hypoxic Respiratory Failure in setting of Community Acquired Pneumonia,  bilateral pleural effusions with associated atelectasis complicated by  perforated Duodenal Ulcer with peritonitis Status post laparotomy Status post ureteral stent placement Left renal abscess, appears to be resolving Candiduria  Failure to wean from vent -S/p exploratory laparotomy and primary repair of duodenal ulcer with omental patch on 06/29/21 -S/P bilateral ureteral stent placement 8/10   Severe ACUTE Hypoxic and Hypercapnic Respiratory Failure -continue Mechanical Ventilator support -continue Bronchodilator Therapy -Wean Fio2 and PEEP as tolerated -VAP/VENT bundle implementation -SAT/SBT today, extubate if able  SEPTIC shock-resolving SOURCE-Severe Sepsis secondary to UTI, Community Acquired Pneumonia and perforated Duodenal Ulcer with peritonitis -ID following for antibiotic management -Fluconazole per ID  ACUTE SYSTOLIC CARDIAC FAILURE AFIB WITH RVR, resolved -oxygen as needed -continue Lopressor 25 mg BID  ACUTE KIDNEY INJURY/Renal Failure -continue Foley Catheter-assess need -Avoid nephrotoxic agents -Follow urine output, BMP -Ensure adequate renal perfusion, optimize oxygenation -Renal dose medications -PermCath replaced 8/19   Intake/Output Summary (Last 24 hours) at 07/14/2021 0815 Last data filed at  07/14/2021 0500 Gross per 24 hour  Intake 535.59 ml  Output 2340 ml  Net -1804.41 ml    ACUTE ANEMIA- TRANSFUSE AS NEEDED CONSIDER TRANSFUSION  IF HGB<7  ENDO - ICU hypoglycemic\Hyperglycemia protocol -check FSBS per protocol  ELECTROLYTES -follow labs as needed -replace as needed -pharmacy consultation and following   Best practice:  Diet:  NPO, TPN Pain/Anxiety/Delirium protocol (if indicated): Yes (RASS goal 0) VAP protocol (if indicated): Yes DVT prophylaxis: SQH GI prophylaxis: PPI Glucose control:  SSI Yes Central venous access:  Yes, and it is still needed Arterial line:  Yes, and it is still needed Foley:  Yes, and it is still needed Mobility:  bed rest  PT consulted: N/A Last date of multidisciplinary goals of care discussion [8/8] Code Status:  full code Disposition: ICU   PLAN FOR SAT/SBT AFTER SESSION OF HD AND ASSESS FOR TRIAL OF EXTUBATION  DVT/GI PRX  assessed I Assessed the need for Labs I Assessed the need for Foley I Assessed the need for Central Venous Line Family Discussion when available I Assessed the need for Mobilization I made an Assessment of medications to be adjusted accordingly Safety Risk assessment completed  CASE  DISCUSSED IN MULTIDISCIPLINARY ROUNDS WITH ICU TEAM   Critical Care Time devoted to patient care services described in this note is 45 minutes.  Critical care was necessary to treat /prevent imminent and life-threatening deterioration. Overall, patient is critically ill, prognosis is guarded.  Patient with Multiorgan failure and at high risk for cardiac arrest and death.    Otelia Limes, M.D.  [facility]

## 2021-07-14 NOTE — Progress Notes (Signed)
[facility]  ROUNDING NOTE   Subjective:   Heather Lopez is a 75 y.o. white female who has been admitted for community acquired pneumonia.    Hospital course complicated by duodenal perforation s/p repair on 06/30/2021 ARF - requiring HD PRN Acute resp failure requiring mech ventilation Left obstructive hydronephrosis and renal abscess, Patient underwent cystoscopy and b/l ureteral stent placement 8/10  Critically ill Intubated and lightly sedated on ventilator (24%), failed weaning trials Opens eyes to family with head nodding Family at bedside Tube feeds at 43m/hr Foley with clear yellow urine UOP 3L in past 24 hours BUN 53   Objective:  Vital signs in last 24 hours:  Temp:  [98.5 F (36.9 C)-100.7 F (38.2 C)] 100.7 F (38.2 C) (08/19 1200) Pulse Rate:  [78-102] 93 (08/19 1200) Resp:  [19-31] 31 (08/19 1200) BP: (131-193)/(52-75) 183/62 (08/19 1200) SpO2:  [97 %-100 %] 97 % (08/19 1200) FiO2 (%):  [24 %] 24 % (08/19 0833) Weight:  [[267kg] 114 kg (08/19 0500)  Weight change: 2 kg Filed Weights   07/12/21 0436 07/13/21 0500 07/14/21 0500  Weight: 107.1 kg 112 kg 114 kg    Intake/Output: I/O last 3 completed shifts: In: 2909.1 [I.V.:629.1; Other:30; NG/GT:1850; IV [id number]:338]Out: 52505[Urine:3800; Drains:140; SLZJQB:3419]  Intake/Output this shift:  Total I/O In: 185 [NG/GT:185] Out: 657[Urine:600; Drains:35]  Physical Exam: General: Critically ill  Head: ETT, OGT  Eyes: Anicteric  Lungs:  Vent assisted  Heart: Regular rate and rhythm  Abdomen:  +JP drains  Extremities:  trace peripheral edema.dependent BUE  Neurologic:  sedated  Skin: No lesions  Access:  RIJ temp cath  Left IJ PermCath-nonfunctional  Basic Metabolic Panel: Recent Labs  Lab 07/10/21 0503 07/11/21 0453 07/11/21 1510 07/12/21 0501 07/13/21 0435 07/14/21 0429  NA 141 141 140 142 146* 143  K 3.3* 2.9* 3.7 3.4* 3.5 3.4*  CL 112* 107 106 107 113* 104  CO2 18* '26 24  25 26 29  ' GLUCOSE 256* 183* 197* 185* 235* 139*  BUN 131* 79* 81* 84* 106* 53*  CREATININE 3.35* 2.12* 2.17* 2.17* 2.45* 1.63*  CALCIUM 7.8* 7.8* 8.3* 8.4* 8.3* 8.1*  MG 2.0 1.7 1.7 1.7 2.0  --   PHOS 3.9 3.3 3.2 2.7 3.1  --      Liver Function Tests: Recent Labs  Lab 07/13/21 0500  AST 52*  ALT 114*  ALKPHOS 107  BILITOT 0.5  PROT 5.3*  ALBUMIN 1.7*    No results for input(s): LIPASE, AMYLASE in the last 168 hours. No results for input(s): AMMONIA in the last 168 hours.  CBC: Recent Labs  Lab 07/10/21 0503 07/11/21 0453 07/11/21 1510 07/12/21 0501 07/13/21 0435 07/14/21 0429  WBC 10.5 7.5  --  7.5 5.6 5.0  HGB 7.1* 6.5* 8.6* 8.1* 7.5* 7.7*  HCT 22.3* 20.0* 26.3* 24.7* 23.2* 24.2*  MCV 86.4 87.3  --  84.6 86.9 86.4  PLT 171 143*  --  162 180 186     Cardiac Enzymes: No results for input(s): CKTOTAL, CKMB, CKMBINDEX, TROPONINI in the last 168 hours.  BNP: Invalid input(s): POCBNP  CBG: Recent Labs  Lab 07/13/21 2002 07/13/21 2341 07/14/21 0415 07/14/21 0737 07/14/21 1312  GLUCAP 92 87 134* 117* 168*     Microbiology: Results for orders placed or performed during the hospital encounter of 06/27/21  Resp Panel by RT-PCR (Flu A&B, Covid) Nasopharyngeal Swab     Status: None   Collection Time: 06/27/21 12:32  AM   Specimen: Nasopharyngeal Swab; Nasopharyngeal(NP) swabs in vial transport medium  Result Value Ref Range Status   SARS Coronavirus 2 by RT PCR NEGATIVE NEGATIVE Final    Comment: (NOTE) SARS-CoV-2 target nucleic acids are NOT DETECTED.  The SARS-CoV-2 RNA is generally detectable in upper respiratory specimens during the acute phase of infection. The lowest concentration of SARS-CoV-2 viral copies this assay can detect is 138 copies/mL. A negative result does not preclude SARS-Cov-2 infection and should not be used as the sole basis for treatment or other patient management decisions. A negative result may occur with  improper specimen  collection/handling, submission of specimen other than nasopharyngeal swab, presence of viral mutation(s) within the areas targeted by this assay, and inadequate number of viral copies(<138 copies/mL). A negative result must be combined with clinical observations, patient history, and epidemiological information. The expected result is Negative.  Fact Sheet for Patients:  EntrepreneurPulse.com.au  Fact Sheet for Healthcare Providers:  IncredibleEmployment.be  This test is no t yet approved or cleared by the [place] FDA and  has been authorized for detection and/or diagnosis of SARS-CoV-2 by FDA under an Emergency Use Authorization (EUA). This EUA will remain  in effect (meaning this test can be used) for the duration of the COVID-19 declaration under Section 564(b)(1) of the Act, 21 U.S.C.section 360bbb-3(b)(1), unless the authorization is terminated  or revoked sooner.       Influenza A by PCR NEGATIVE NEGATIVE Final   Influenza B by PCR NEGATIVE NEGATIVE Final    Comment: (NOTE) The Xpert Xpress SARS-CoV-2/FLU/RSV plus assay is intended as an aid in the diagnosis of influenza from Nasopharyngeal swab specimens and should not be used as a sole basis for treatment. Nasal washings and aspirates are unacceptable for Xpert Xpress SARS-CoV-2/FLU/RSV testing.  Fact Sheet for Patients: EntrepreneurPulse.com.au  Fact Sheet for Healthcare Providers: IncredibleEmployment.be  This test is not yet approved or cleared by the [place] FDA and has been authorized for detection and/or diagnosis of SARS-CoV-2 by FDA under an Emergency Use Authorization (EUA). This EUA will remain in effect (meaning this test can be used) for the duration of the COVID-19 declaration under Section 564(b)(1) of the Act, 21 U.S.C. section 360bbb-3(b)(1), unless the authorization is terminated or revoked.  Performed at [facility], [address] [place], [place] [address]   Blood culture (routine x 2)     Status: None   Collection Time: 06/27/21 12:43 AM   Specimen: BLOOD  Result Value Ref Range Status   Specimen Description BLOOD RIGHT ASSIST CONTROL  Final   Special Requests   Final    BOTTLES DRAWN AEROBIC AND ANAEROBIC Blood Culture results may not be optimal due to an inadequate volume of blood received in culture bottles   Culture   Final    NO GROWTH 5 DAYS Performed at [facility], [address], [place], [place] [address]    Report Status 07/02/2021 FINAL  Final  Blood culture (routine x 2)     Status: None   Collection Time: 06/27/21  1:45 AM   Specimen: BLOOD  Result Value Ref Range Status   Specimen Description BLOOD RIGHT ASSIST CONTROL  Final   Special Requests   Final    BOTTLES DRAWN AEROBIC AND ANAEROBIC Blood Culture adequate volume   Culture   Final    NO GROWTH 5 DAYS Performed at [facility], [address] [place], [place] [address]    Report Status 07/02/2021 FINAL  Final  Urine Culture     Status: Abnormal   Collection Time: 06/27/21  2:39 AM   Specimen: In/Out Cath Urine  Result Value Ref Range Status   Specimen Description   Final    IN/OUT CATH URINE Performed at [facility], [address] [place], [place] [address]    Special Requests   Final    NONE Performed at [facility], [place], [place] [address]    Culture 30,000 COLONIES/mL YEAST (A)  Final   Report Status 06/28/2021 FINAL  Final  MRSA Next Gen by PCR, Nasal     Status: None   Collection Time: 06/30/21  5:01 AM   Specimen: Nasal Mucosa; Nasal Swab  Result Value Ref Range Status   MRSA by PCR Next Gen NOT DETECTED NOT DETECTED Final    Comment: (NOTE) The GeneXpert MRSA Assay (FDA approved for NASAL specimens only), is one component of a comprehensive MRSA colonization surveillance program. It is not intended to diagnose MRSA  infection nor to guide or monitor treatment for MRSA infections. Test performance is not FDA approved in patients less than 40 years old. Performed at [facility], [address], [place], [place] [address]   Urine Culture     Status: Abnormal   Collection Time: 07/05/21 10:36 AM   Specimen: PATH Other; Urine  Result Value Ref Range Status   Specimen Description   Final    URINE, RANDOM LEFT RENAL PELVIS URINE Performed at [facility], [address], [place], [place] [address]    Special Requests   Final    NONE Performed at [facility], [address] [place], [place] [address]    Culture 70,000 COLONIES/mL CANDIDA GLABRATA (A)  Final   Report Status 07/12/2021 FINAL  Final  Urine Culture     Status: Abnormal (Preliminary result)   Collection Time: 07/05/21 10:48 AM   Specimen: PATH Other; Urine  Result Value Ref Range Status   Specimen Description   Final    URINE, RANDOM RIGHT KIDNEY URINE Performed at [facility], [address], [place], [place] [address]    Special Requests   Final    NONE Performed at [facility], [address] [place], [place] [address]    Culture (A)  Final    >=100,000 COLONIES/mL CANDIDA GLABRATA Sent to [place] for further susceptibility testing. Performed at [facility], [place] [address], [place],  [address]    Report Status PENDING  Incomplete    Coagulation Studies: No results for input(s): LABPROT, INR in the last 72 hours.   Urinalysis: No results for input(s): COLORURINE, LABSPEC, PHURINE, GLUCOSEU, HGBUR, BILIRUBINUR, KETONESUR, PROTEINUR, UROBILINOGEN, NITRITE, LEUKOCYTESUR in the last 72 hours.  Invalid input(s): APPERANCEUR     Imaging: CT ABDOMEN PELVIS WO CONTRAST  Result Date: 07/12/2021 CLINICAL DATA:  Fever, sacral ulcer. EXAM: CT ABDOMEN AND PELVIS WITHOUT CONTRAST TECHNIQUE: Multidetector CT imaging of the abdomen and pelvis was performed following  the standard protocol without IV contrast. COMPARISON:  July 07, 2021. FINDINGS: Lower chest: Mild bilateral posterior basilar subsegmental atelectasis or infiltrates are noted. Possible small left pleural effusion is noted. Hepatobiliary: No focal liver abnormality is seen. No gallstones, gallbladder wall thickening, or biliary dilatation. Pancreas: Unremarkable. No pancreatic ductal dilatation or surrounding inflammatory changes. Spleen: Normal in size without focal abnormality. Adrenals/Urinary Tract: Adrenal glands appear normal. Bilateral ureteral stents are again noted. Urinary bladder is decompressed secondary to Foley catheter.  No definite hydronephrosis is noted. 7.8 x 3.6 cm fluid collection is seen anterior to upper pole of left kidney which is not significantly changed in size based on my own measurements of prior study. Bilateral nonobstructive nephrolithiasis is again noted. Stomach/Bowel: Rectal tube is noted. Nasogastric tube tip is seen in proximal stomach. No definite evidence of bowel obstruction or inflammation is noted. Vascular/Lymphatic: Aortic atherosclerosis. Stable retroperitoneal lymph nodes are noted which most likely reactive in etiology. Reproductive: Possible small uterine fibroids. No adnexal abnormality is noted. Other: Grossly stable position of surgical drain entering right lower quadrant with distal tip in central pelvis. Stable position of surgical drain entering right upper quadrant and passing underneath right hepatic lobe with distal tip inferior to left hepatic lobe. No definite fluid collections are noted. Musculoskeletal: No acute or significant osseous findings. IMPRESSION: Mild bilateral posterior basilar subsegmental atelectasis or infiltrates with possible small left pleural effusion. Stable bilateral ureteral stents are noted without definite hydronephrosis. Grossly stable 7.8 x 3.6 cm fluid collection is seen anterior to upper pole of left kidney. Bilateral  nonobstructive nephrolithiasis. Grossly stable position of 2 surgical drains as described above. No definite fluid collection is noted around these catheters. Aortic Atherosclerosis (ICD10-I70.0). Electronically Signed   By: Marijo Conception M.D.   On: 07/12/2021 19:32   PERIPHERAL VASCULAR CATHETERIZATION  Result Date: 07/13/2021 See surgical note for result.  DG Chest Port 1 View  Result Date: 07/13/2021 CLINICAL DATA:  Acute respiratory failure with hypoxia EXAM: PORTABLE CHEST 1 VIEW COMPARISON:  07/10/2021 FINDINGS: Endotracheal tube, enteric tube, bilateral IJ approach central lines are unchanged. Linear atelectasis bilaterally. Similar pulmonary vascular congestion. Cardiomediastinal contours are stable. No pneumothorax or significant pleural effusion. IMPRESSION: Unchanged lines and tubes. Similar pulmonary vascular congestion and bilateral patchy linear atelectasis. Electronically Signed   By: Macy Mis M.D.   On: 07/13/2021 08:09     Medications:    sodium chloride     dexmedetomidine (PRECEDEX) IV infusion 0.6 mcg/kg/hr (07/14/21 0700)   feeding supplement (PIVOT 1.5 CAL) Stopped (07/14/21 0930)   fentaNYL infusion INTRAVENOUS Stopped (07/11/21 1141)   fluconazole (DIFLUCAN) IV Stopped (07/13/21 2320)    amLODipine  10 mg Per Tube Daily   chlorhexidine gluconate (MEDLINE KIT)  15 mL Mouth Rinse BID   Chlorhexidine Gluconate Cloth  6 each Topical Q0600   feeding supplement (PROSource TF)  45 mL Per Tube Daily   free water  50 mL Per Tube Q4H   heparin injection (subcutaneous)  5,000 Units Subcutaneous Q8H   insulin aspart  0-20 Units Subcutaneous Q4H   insulin aspart  12 Units Subcutaneous Q4H   insulin detemir  46 Units Subcutaneous BID   mouth rinse  15 mL Mouth Rinse 10 times per day   metoprolol tartrate  25 mg Per Tube BID   multivitamin  1 tablet Per Tube QHS   nutrition supplement (JUVEN)  1 packet Per Tube BID BM   pantoprazole (PROTONIX) IV  40 mg Intravenous  Q12H     Assessment/ Plan:  Heather Lopez is a 75 y.o.  female who has been admitted for community acquired pneumonia.    Acute Kidney Injury on chronic kidney disease stage 4 with baseline creatinine 1.97 and GFR of 25 on 12/22/19.  Acute kidney injury secondary to sepsis and obstructive uropathy Chronic kidney disease is secondary to diabetic nephropathy Continue to hold losartan and metformin Creatinine increasing with adequate UOP Received dialysis yesterday, tolerated well Will continue to assess  need for dialysis dialy Continue supportive care Avoid nephrotoxins  Lab Results  Component Value Date   CREATININE 1.63 (H) 07/14/2021   CREATININE 2.45 (H) 07/13/2021   CREATININE 2.17 (H) 07/12/2021    Intake/Output Summary (Last 24 hours) at 07/14/2021 1418 Last data filed at 07/14/2021 1200 Gross per 24 hour  Intake 2663.73 ml  Output 2975 ml  Net -311.27 ml    2. Acute Respiratory failure secondary to community acquired pneumonia failing outpatient antibiotics.  Mechanical ventilation Weaning trials have failed. Trach placement considered by primary team  3. Anemia of chronic kidney disease Normocytic Lab Results  Component Value Date   HGB 7.7 (L) 07/14/2021   Low threshold to initiate ESA EPO with HD treatment yesterday   4.  Diabetes mellitus type II with chronic kidney disease insulin dependent. Most recent hemoglobin A1c is 8.3 on 06/12/21.  Metformin held, Stable   5.Bilateral Nephrolithiasis- with obstructive uropathy and left renal abscess Right - Mild to moderate chronic hydronephrosis without obstructing stone Left-mild left hydronephrosis with 14 mm stone at left UPJ- b/l ureteral stent placed 8/10 Lesion at upper pole of left kidney enlarged - likely hematoma or abscess  6. S/p  duodenal perforation s/p repair on 06/30/2021 Tube feeds @ 89m/hr  7. Hypernatremia - free water deficit - increase free water supplement - 200 q 3 hrs Stable        LOS: 17   8/19/20222:18 PM

## 2021-07-14 NOTE — Progress Notes (Signed)
PT Cancellation Note  Patient Details Name: Heather Lopez MRN: [id number] DOB: 1946/01/28   Cancelled Treatment:    Reason Eval/Treat Not Completed: Other (comment) Pt orders received, chart reviewed. Spoke with nurse who reports pt was recently extubated & is currently on bipap, noting pt is not appropriate for PT at this time. Will re-attempt as able & as pt is appropriate.   Lavone Nian, PT, DPT 07/14/21, 12:35 PM    Waunita Schooner 07/14/2021, 12:33 PM

## 2021-07-14 NOTE — TOC Progression Note (Signed)
Transition of Care [facility]) - Progression Note    Patient Details  Name: Heather Lopez MRN: [id number] Date of Birth: 1946/02/15  Transition of Care [facility]) CM/SW [place], [place] Phone Number: 07/14/2021, 2:33 PM  Clinical Narrative:     Patient failed weaning trial and currently on bipap.  Patient opens eyes, nods head. Good family support. Pt unable to evaluate and will return once patient is more appropriate for PT.  Attending spoke with patient's family, trach is a possibility.Main contact Dern,Steven C (Spouse) [phone] [facility] Phone). Family has not made decision about trach.  Expected Discharge Plan: [place] Barriers to Discharge: Continued Medical Work up  Expected Discharge Plan and Services Expected Discharge Plan: [place] In-house Referral: Clinical Social Work   Post Acute Care Choice: [place] Living arrangements for the past 2 months: Single Family Home                                       Social Determinants of Health (SDOH) Interventions    Readmission Risk Interventions No flowsheet data found.

## 2021-07-14 NOTE — Progress Notes (Signed)
[facility] Hospital Day(s): [place] op day(s): 15 Days Post-Op.   Interval History:  Patient seen and examined Had tunneled HD catheter placed yesterday (08/18) Remains intubated/sedated She remains without leukocytosis to 5.0K; no fevers Hgb stable, but low, at 7.7 Renal function improving, sCr - 1.63; UO - 3.0L Mild hypokalemia to 3.4 Surgical drains:             - Right mid abdomen (draining repair site); 20 ccs; serous             - RLQ (draining dependent pelvis): 70 ccs; serous Tube feedings advancing; 50 ccs/hr; at goal  She is having bowel function; multiple large loose stools daily; rectal tube placed; 1100 ccs  Potential plan for extubation today per staff  Vital signs in last 24 hours: [min-max] current  Temp:  [98.5 F (36.9 C)-99.3 F (37.4 C)] 98.9 F (37.2 C) (08/19 0400) Pulse Rate:  [75-102] 92 (08/19 0500) Resp:  [19-28] 23 (08/19 0500) BP: (131-193)/(50-71) 158/61 (08/19 0500) SpO2:  [97 %-100 %] 100 % (08/19 0500) FiO2 (%):  [24 %-30 %] 24 % (08/19 0433) Weight:  [409 kg] 114 kg (08/19 0500)     Height: 5' 2.01" (157.5 cm) Weight: 114 kg BMI (Calculated): 45.96   Intake/Output last 2 shifts:  08/18 0701 - 08/19 0700 In: 535.6 [I.V.:335.6; IV Piggyback:200] Out: 8119 [Urine:3050; Drains:90; Stool:1100]   Physical Exam:  Constitutional: intubated, sedated HEENT: NGT in place; receiving tube feedings Respiratory: intubated Cardiovascular: regular rate, sinus rhythm Gastrointestinal: Soft, unable to reliably assess tenderness, non-distended, surgical drains x2 in right lower abdomen, output serous in both  Genitourinary: Foley in place  Integumentary: Midline incision is well healed, I was able to remove staples, no erythema or drainage   Labs:  CBC Latest Ref Rng & Units 07/14/2021 07/13/2021 07/12/2021  WBC 4.0 - 10.5 K/uL 5.0 5.6 7.5  Hemoglobin 12.0 - 15.0 g/dL 7.7(L) 7.5(L) 8.1(L)  Hematocrit 36.0 -  46.0 % 24.2(L) 23.2(L) 24.7(L)  Platelets 150 - 400 K/uL 186 180 162   CMP Latest Ref Rng & Units 07/14/2021 07/13/2021 07/12/2021  Glucose 70 - 99 mg/dL 139(H) 235(H) 185(H)  BUN 8 - 23 mg/dL 53(H) 106(H) 84(H)  Creatinine 0.44 - 1.00 mg/dL 1.63(H) 2.45(H) 2.17(H)  Sodium 135 - 145 mmol/L 143 146(H) 142  Potassium 3.5 - 5.1 mmol/L 3.4(L) 3.5 3.4(L)  Chloride 98 - 111 mmol/L 104 113(H) 107  CO2 22 - 32 mmol/L 29 26 25   Calcium 8.9 - 10.3 mg/dL 8.1(L) 8.3(L) 8.4(L)  Total Protein 6.5 - 8.1 g/dL - 5.3(L) -  Total Bilirubin 0.3 - 1.2 mg/dL - 0.5 -  Alkaline Phos 38 - 126 U/L - 107 -  AST 15 - 41 U/L - 52(H) -  ALT 0 - 44 U/L - 114(H) -     Imaging studies: No new pertinent imaging studies   Assessment/Plan:  75 y.o. critically ill requiring ventilator support with failure to wean female 13 days s/p exploratory laparotomy and primary repair of duodenal ulcer with omental patch   - Appreciate PCCM support; extubate per their service   - Appreciate palliative care assistance  - Staples removed at bedside today; okay to be open to air, can cover with dry gauze prn              - Continue tube feedings; at goal   - Okay to continue stool management device  - Appreciate urology assistance; follow  up UCx (growing yeast); on Diflucan  - Monitor abdominal examination; on-going bowel function - Maintain surgical drains; monitor and record output    All of the above findings and recommendations were discussed with the medical team.   -- Edison Simon, PA-C  [facility] 07/14/2021, 7:13 AM [phone] M-F: 7am - 4pm

## 2021-07-15 ENCOUNTER — Inpatient Hospital Stay: Payer: Medicare Other

## 2021-07-15 DIAGNOSIS — N170 Acute kidney failure with tubular necrosis: Secondary | ICD-10-CM | POA: Diagnosis not present

## 2021-07-15 DIAGNOSIS — J9601 Acute respiratory failure with hypoxia: Secondary | ICD-10-CM | POA: Diagnosis not present

## 2021-07-15 DIAGNOSIS — N200 Calculus of kidney: Secondary | ICD-10-CM | POA: Diagnosis not present

## 2021-07-15 DIAGNOSIS — K668 Other specified disorders of peritoneum: Secondary | ICD-10-CM | POA: Diagnosis not present

## 2021-07-15 LAB — CBC
HCT: 26.6 % — ABNORMAL LOW (ref 36.0–46.0)
Hemoglobin: 8.5 g/dL — ABNORMAL LOW (ref 12.0–15.0)
MCH: 28 pg (ref 26.0–34.0)
MCHC: 32 g/dL (ref 30.0–36.0)
MCV: 87.5 fL (ref 80.0–100.0)
Platelets: 267 10*3/uL (ref 150–400)
RBC: 3.04 MIL/uL — ABNORMAL LOW (ref 3.87–5.11)
RDW: 16.7 % — ABNORMAL HIGH (ref 11.5–15.5)
WBC: 5.2 10*3/uL (ref 4.0–10.5)
nRBC: 0 % (ref 0.0–0.2)

## 2021-07-15 LAB — HEPATIC FUNCTION PANEL
ALT: 139 U/L — ABNORMAL HIGH (ref 0–44)
AST: 59 U/L — ABNORMAL HIGH (ref 15–41)
Albumin: 1.7 g/dL — ABNORMAL LOW (ref 3.5–5.0)
Alkaline Phosphatase: 124 U/L (ref 38–126)
Bilirubin, Direct: 0.1 mg/dL (ref 0.0–0.2)
Indirect Bilirubin: 0.6 mg/dL (ref 0.3–0.9)
Total Bilirubin: 0.7 mg/dL (ref 0.3–1.2)
Total Protein: 5.8 g/dL — ABNORMAL LOW (ref 6.5–8.1)

## 2021-07-15 LAB — BASIC METABOLIC PANEL
Anion gap: 13 (ref 5–15)
BUN: 67 mg/dL — ABNORMAL HIGH (ref 8–23)
CO2: 28 mmol/L (ref 22–32)
Calcium: 8.4 mg/dL — ABNORMAL LOW (ref 8.9–10.3)
Chloride: 104 mmol/L (ref 98–111)
Creatinine, Ser: 1.97 mg/dL — ABNORMAL HIGH (ref 0.44–1.00)
GFR, Estimated: 26 mL/min — ABNORMAL LOW (ref >60)
Glucose, Bld: 208 mg/dL — ABNORMAL HIGH (ref 70–99)
Potassium: 4 mmol/L (ref 3.5–5.1)
Sodium: 145 mmol/L (ref 135–145)

## 2021-07-15 LAB — URINALYSIS, ROUTINE W REFLEX MICROSCOPIC
Bilirubin Urine: NEGATIVE
Glucose, UA: NEGATIVE mg/dL
Ketones, ur: NEGATIVE mg/dL
Nitrite: NEGATIVE
Protein, ur: >300 mg/dL — AB
Specific Gravity, Urine: 1.02 (ref 1.005–1.030)
Squamous Epithelial / HPF: NONE SEEN (ref 0–5)
WBC, UA: >50 WBC/hpf — ABNORMAL HIGH (ref 0–5)
pH: 6 (ref 5.0–8.0)

## 2021-07-15 LAB — GLUCOSE, CAPILLARY
Glucose-Capillary: 222 mg/dL — ABNORMAL HIGH (ref 70–99)
Glucose-Capillary: 210 mg/dL — ABNORMAL HIGH (ref 70–99)
Glucose-Capillary: 180 mg/dL — ABNORMAL HIGH (ref 70–99)
Glucose-Capillary: 143 mg/dL — ABNORMAL HIGH (ref 70–99)
Glucose-Capillary: 155 mg/dL — ABNORMAL HIGH (ref 70–99)

## 2021-07-15 LAB — MAGNESIUM: Magnesium: 1.9 mg/dL (ref 1.7–2.4)

## 2021-07-15 LAB — PHOSPHORUS: Phosphorus: 3.1 mg/dL (ref 2.5–4.6)

## 2021-07-15 MED ORDER — VANCOMYCIN HCL 2000 MG/400ML IV SOLN
2000.0000 mg | Freq: Once | INTRAVENOUS | Status: AC
  Administered 2021-07-15: 2000 mg via INTRAVENOUS
  Filled 2021-07-15: qty 400

## 2021-07-15 MED ORDER — HYDRALAZINE HCL 20 MG/ML IJ SOLN
10.0000 mg | Freq: Once | INTRAMUSCULAR | Status: AC
  Administered 2021-07-15: 10 mg via INTRAVENOUS

## 2021-07-15 MED ORDER — FREE WATER
100.0000 mL | Status: DC
  Administered 2021-07-15 – 2021-07-17 (×12): 100 mL

## 2021-07-15 MED ORDER — ACETAMINOPHEN 160 MG/5ML PO SOLN
650.0000 mg | Freq: Four times a day (QID) | ORAL | Status: DC | PRN
  Administered 2021-07-15: 650 mg via ORAL
  Filled 2021-07-15 (×4): qty 20.3

## 2021-07-15 MED ORDER — ORAL CARE MOUTH RINSE
15.0000 mL | Freq: Two times a day (BID) | OROMUCOSAL | Status: DC
  Administered 2021-07-16 – 2021-08-03 (×36): 15 mL via OROMUCOSAL

## 2021-07-15 MED ORDER — CLONIDINE HCL 0.1 MG PO TABS
0.2000 mg | ORAL_TABLET | Freq: Every day | ORAL | Status: DC
  Administered 2021-07-15 – 2021-07-18 (×4): 0.2 mg
  Filled 2021-07-15 (×5): qty 2

## 2021-07-15 MED ORDER — CHLORHEXIDINE GLUCONATE 0.12 % MT SOLN
15.0000 mL | Freq: Two times a day (BID) | OROMUCOSAL | Status: DC
  Administered 2021-07-15 – 2021-08-03 (×37): 15 mL via OROMUCOSAL
  Filled 2021-07-15 (×31): qty 15

## 2021-07-15 MED ORDER — ACETAMINOPHEN 160 MG/5ML PO SOLN
500.0000 mg | Freq: Four times a day (QID) | ORAL | Status: DC
  Administered 2021-07-15 – 2021-07-16 (×2): 500 mg
  Filled 2021-07-15 (×4): qty 20.3

## 2021-07-15 MED ORDER — METOPROLOL TARTRATE 25 MG PO TABS
25.0000 mg | ORAL_TABLET | Freq: Once | ORAL | Status: AC
  Administered 2021-07-15: 25 mg
  Filled 2021-07-15: qty 1

## 2021-07-15 MED ORDER — PIPERACILLIN-TAZOBACTAM 3.375 G IVPB
3.3750 g | Freq: Three times a day (TID) | INTRAVENOUS | Status: DC
  Administered 2021-07-15 – 2021-07-21 (×19): 3.375 g via INTRAVENOUS
  Filled 2021-07-15 (×20): qty 50

## 2021-07-15 NOTE — Progress Notes (Signed)
[facility]  ROUNDING NOTE   Subjective:   Heather Lopez is a 75 y.o. white female who has been admitted for community acquired pneumonia.    Hospital course complicated by duodenal perforation s/p repair on 06/30/2021 ARF - requiring HD as needed. Last hemodialysis was 8/18.  Acute resp failure requiring mech ventilation Left obstructive hydronephrosis and renal abscess, Patient underwent cystoscopy and b/l ureteral stent placement 8/10  UOP 2764m.   Extubated  Working with physical therapy this morning.   No new labs.    Objective:  Vital signs in last 24 hours:  Temp:  [99 F (37.2 C)-100.7 F (38.2 C)] 100.7 F (38.2 C) (08/20 0400) Pulse Rate:  [88-117] 110 (08/20 0800) Resp:  [24-31] 24 (08/19 1600) BP: (157-201)/(51-79) 172/66 (08/20 0800) SpO2:  [94 %-100 %] 100 % (08/20 0800) FiO2 (%):  [24 %] 24 % (08/19 1600)  Weight change:  Filed Weights   07/12/21 0436 07/13/21 0500 07/14/21 0500  Weight: 107.1 kg 112 kg 114 kg    Intake/Output: I/O last 3 completed shifts: In: 2850.3 [I.V.:465.3; NG/GT:2185; IV PIRSWNIOEV:035]Out: 40093[Urine:4000; Drains:210; Stool:200]   Intake/Output this shift:  Total I/O In: 900 [NG/GT:700; IV Piggyback:200] Out: -   Physical Exam: General: Critically ill  Head: ETT, OGT  Eyes: Anicteric  Lungs:     Heart: Regular rate and rhythm  Abdomen:  +JP drains  Extremities:  trace peripheral edema.dependent BUE  Neurologic:     Skin: No lesions  GU Foley with yellow urine  Access:  RIJ temp cath, LIJ permcath not functioning.     Basic Metabolic Panel: Recent Labs  Lab 07/10/21 0503 07/11/21 0453 07/11/21 1510 07/12/21 0501 07/13/21 0435 07/14/21 0429  NA 141 141 140 142 146* 143  K 3.3* 2.9* 3.7 3.4* 3.5 3.4*  CL 112* 107 106 107 113* 104  CO2 18* _0 GLUCOSE 256* 183* 197* 185* 235* 139*  BUN 131* 79* 81* 84* 106* 53*  CREATININE 3.35* 2.12* 2.17* 2.17* 2.45* 1.63*  CALCIUM 7.8* 7.8* 8.3*  8.4* 8.3* 8.1*  MG 2.0 1.7 1.7 1.7 2.0  --   PHOS 3.9 3.3 3.2 2.7 3.1  --      Liver Function Tests: Recent Labs  Lab 07/13/21 0500  AST 52*  ALT 114*  ALKPHOS 107  BILITOT 0.5  PROT 5.3*  ALBUMIN 1.7*    No results for input(s): LIPASE, AMYLASE in the last 168 hours. No results for input(s): AMMONIA in the last 168 hours.  CBC: Recent Labs  Lab 07/10/21 0503 07/11/21 0453 07/11/21 1510 07/12/21 0501 07/13/21 0435 07/14/21 0429  WBC 10.5 7.5  --  7.5 5.6 5.0  HGB 7.1* 6.5* 8.6* 8.1* 7.5* 7.7*  HCT 22.3* 20.0* 26.3* 24.7* 23.2* 24.2*  MCV 86.4 87.3  --  84.6 86.9 86.4  PLT 171 143*  --  162 180 186     Cardiac Enzymes: No results for input(s): CKTOTAL, CKMB, CKMBINDEX, TROPONINI in the last 168 hours.  BNP: Invalid input(s): POCBNP  CBG: Recent Labs  Lab 07/14/21 1544 07/14/21 1934 07/14/21 2327 07/15/21 0402 07/15/21 0738  GLUCAP 188* 246* 256* 222* 210*     Microbiology: Results for orders placed or performed during the hospital encounter of 06/27/21  Resp Panel by RT-PCR (Flu A&B, Covid) Nasopharyngeal Swab     Status: None   Collection Time: 06/27/21 12:32 AM   Specimen: Nasopharyngeal Swab; Nasopharyngeal(NP) swabs in vial transport medium  Result  Value Ref Range Status   SARS Coronavirus 2 by RT PCR NEGATIVE NEGATIVE Final    Comment: (NOTE) SARS-CoV-2 target nucleic acids are NOT DETECTED.  The SARS-CoV-2 RNA is generally detectable in upper respiratory specimens during the acute phase of infection. The lowest concentration of SARS-CoV-2 viral copies this assay can detect is 138 copies/mL. A negative result does not preclude SARS-Cov-2 infection and should not be used as the sole basis for treatment or other patient management decisions. A negative result may occur with  improper specimen collection/handling, submission of specimen other than nasopharyngeal swab, presence of viral mutation(s) within the areas targeted by this assay, and  inadequate number of viral copies(<138 copies/mL). A negative result must be combined with clinical observations, patient history, and epidemiological information. The expected result is Negative.  Fact Sheet for Patients:  EntrepreneurPulse.com.au  Fact Sheet for Healthcare Providers:  IncredibleEmployment.be  This test is no t yet approved or cleared by the [place] FDA and  has been authorized for detection and/or diagnosis of SARS-CoV-2 by FDA under an Emergency Use Authorization (EUA). This EUA will remain  in effect (meaning this test can be used) for the duration of the COVID-19 declaration under Section 564(b)(1) of the Act, 21 U.S.C.section 360bbb-3(b)(1), unless the authorization is terminated  or revoked sooner.       Influenza A by PCR NEGATIVE NEGATIVE Final   Influenza B by PCR NEGATIVE NEGATIVE Final    Comment: (NOTE) The Xpert Xpress SARS-CoV-2/FLU/RSV plus assay is intended as an aid in the diagnosis of influenza from Nasopharyngeal swab specimens and should not be used as a sole basis for treatment. Nasal washings and aspirates are unacceptable for Xpert Xpress SARS-CoV-2/FLU/RSV testing.  Fact Sheet for Patients: EntrepreneurPulse.com.au  Fact Sheet for Healthcare Providers: IncredibleEmployment.be  This test is not yet approved or cleared by the [place] FDA and has been authorized for detection and/or diagnosis of SARS-CoV-2 by FDA under an Emergency Use Authorization (EUA). This EUA will remain in effect (meaning this test can be used) for the duration of the COVID-19 declaration under Section 564(b)(1) of the Act, 21 U.S.C. section 360bbb-3(b)(1), unless the authorization is terminated or revoked.  Performed at [facility], [address] [place], [place] [address]   Blood culture (routine x 2)     Status: None   Collection Time: 06/27/21 12:43 AM    Specimen: BLOOD  Result Value Ref Range Status   Specimen Description BLOOD RIGHT ASSIST CONTROL  Final   Special Requests   Final    BOTTLES DRAWN AEROBIC AND ANAEROBIC Blood Culture results may not be optimal due to an inadequate volume of blood received in culture bottles   Culture   Final    NO GROWTH 5 DAYS Performed at [facility], [address], [place], [place] [address]    Report Status 07/02/2021 FINAL  Final  Blood culture (routine x 2)     Status: None   Collection Time: 06/27/21  1:45 AM   Specimen: BLOOD  Result Value Ref Range Status   Specimen Description BLOOD RIGHT ASSIST CONTROL  Final   Special Requests   Final    BOTTLES DRAWN AEROBIC AND ANAEROBIC Blood Culture adequate volume   Culture   Final    NO GROWTH 5 DAYS Performed at [facility], [address] [place], [place] [address]    Report Status 07/02/2021 FINAL  Final  Urine Culture     Status: Abnormal   Collection  Time: 06/27/21  2:39 AM   Specimen: In/Out Cath Urine  Result Value Ref Range Status   Specimen Description   Final    IN/OUT CATH URINE Performed at [facility], [address] [place], [place] [address]    Special Requests   Final    NONE Performed at [facility], [place], [place] [address]    Culture 30,000 COLONIES/mL YEAST (A)  Final   Report Status 06/28/2021 FINAL  Final  MRSA Next Gen by PCR, Nasal     Status: None   Collection Time: 06/30/21  5:01 AM   Specimen: Nasal Mucosa; Nasal Swab  Result Value Ref Range Status   MRSA by PCR Next Gen NOT DETECTED NOT DETECTED Final    Comment: (NOTE) The GeneXpert MRSA Assay (FDA approved for NASAL specimens only), is one component of a comprehensive MRSA colonization surveillance program. It is not intended to diagnose MRSA infection nor to guide or monitor treatment for MRSA infections. Test performance is not FDA approved in patients less than 4 years old. Performed at  [facility], [address] [place], [place] [address]   Urine Culture     Status: Abnormal   Collection Time: 07/05/21 10:36 AM   Specimen: PATH Other; Urine  Result Value Ref Range Status   Specimen Description   Final    URINE, RANDOM LEFT RENAL PELVIS URINE Performed at [facility], [address], [place], [place] [address]    Special Requests   Final    NONE Performed at [facility], [address] [place], [place] [address]    Culture 70,000 COLONIES/mL CANDIDA GLABRATA (A)  Final   Report Status 07/12/2021 FINAL  Final  Urine Culture     Status: Abnormal (Preliminary result)   Collection Time: 07/05/21 10:48 AM   Specimen: PATH Other; Urine  Result Value Ref Range Status   Specimen Description   Final    URINE, RANDOM RIGHT KIDNEY URINE Performed at [facility], [address], [place], [place] [address]    Special Requests   Final    NONE Performed at [facility], [address] [place], [place] [address]    Culture (A)  Final    >=100,000 COLONIES/mL CANDIDA GLABRATA Sent to [place] for further susceptibility testing. Performed at [facility], [place] [address] [place], [place] [address]    Report Status PENDING  Incomplete  Antifungal AST 9 Drug Panel     Status: None (Preliminary result)   Collection Time: 07/05/21 10:48 AM  Result Value Ref Range Status   Organism ID, Yeast Preliminary report  Final    Comment: (NOTE) Specimen has been received and testing has been initiated. Performed At: [facility] [place], [place] [id number] Rush Farmer MD [id number]    Amphotericin B MIC PENDING  Incomplete   Anidulafungin MIC PENDING  Incomplete   Caspofungin MIC PENDING  Incomplete   Micafungin MIC PENDING  Incomplete   Posaconazole MIC PENDING  Incomplete   Fluconazole Islt MIC PENDING  Incomplete   Flucytosine MIC PENDING  Incomplete   Itraconazole MIC PENDING   Incomplete   Voriconazole MIC PENDING  Incomplete   Source CANDIDA GLABRATA SUSCEPTIBILITY URINE CULTURE  Final    Comment: Performed at [facility], [place] [address] [place], [place] [address]    Coagulation Studies: No results for input(s): LABPROT, INR in the last 72 hours.   Urinalysis: No  results for input(s): COLORURINE, LABSPEC, [place], GLUCOSEU, HGBUR, BILIRUBINUR, KETONESUR, PROTEINUR, UROBILINOGEN, NITRITE, LEUKOCYTESUR in the last 72 hours.  Invalid input(s): APPERANCEUR     Imaging: PERIPHERAL VASCULAR CATHETERIZATION  Result Date: 07/13/2021 See surgical note for result.    Medications:    sodium chloride     feeding supplement (PIVOT 1.5 CAL) 50 mL/hr at 07/14/21 1600   fluconazole (DIFLUCAN) IV Stopped (07/14/21 2126)    amLODipine  10 mg Per Tube Daily   chlorhexidine gluconate (MEDLINE KIT)  15 mL Mouth Rinse BID   Chlorhexidine Gluconate Cloth  6 each Topical Q0600   cloNIDine  0.2 mg Per Tube Daily   feeding supplement (PROSource TF)  45 mL Per Tube Daily   free water  50 mL Per Tube Q4H   heparin injection (subcutaneous)  5,000 Units Subcutaneous Q8H   insulin aspart  0-20 Units Subcutaneous Q4H   insulin aspart  12 Units Subcutaneous Q4H   insulin detemir  46 Units Subcutaneous BID   mouth rinse  15 mL Mouth Rinse 10 times per day   metoprolol tartrate  25 mg Per Tube BID   multivitamin  1 tablet Per Tube QHS   nutrition supplement (JUVEN)  1 packet Per Tube BID BM   oxyCODONE  5-10 mg Per Tube Q6H     Assessment/ Plan:  [name] is a 75 y.o. white female with diabetes mellitus type II, hypertension, hyperlipidemia, nephrolithiasis, depression, gout who is admitted to [facility] on 06/27/2021 for Hyperkalemia [E87.5] Hyponatremia [E87.1] ARF (acute renal failure) (HCC) [N17.9] Acute renal failure (ARF) (HCC) [N17.9] Acute respiratory failure with hypoxia ([place]) [J96.01] Demand ischemia of myocardium (HCC) [I24.8] Acute respiratory failure  with hypoxemia (HCC) [J96.01] Acute renal failure superimposed on chronic kidney disease, unspecified CKD stage, unspecified acute renal failure type ([place]) [N17.9, N18.9] Sepsis, due to unspecified organism, unspecified whether acute organ dysfunction present [facility]) [A41.9]  Acute Kidney Injury on chronic kidney disease stage IV with baseline creatinine 1.97 and GFR of 25 on 12/22/19.  Acute kidney injury secondary to sepsis and obstructive uropathy Chronic kidney disease is secondary to diabetic nephropathy Continue to hold losartan and metformin Creatinine increasing with adequate UOP No indication for dialysis. Monitor daily dialysis need.   Lab Results  Component Value Date   CREATININE 1.63 (H) 07/14/2021   CREATININE 2.45 (H) 07/13/2021   CREATININE 2.17 (H) 07/12/2021    Intake/Output Summary (Last 24 hours) at 07/15/2021 0936 Last data filed at 07/15/2021 0800 Gross per 24 hour  Intake 1086.59 ml  Output 2700 ml  Net -1613.41 ml    2. Acute Respiratory failure secondary to community acquired pneumonia failing outpatient antibiotics.    3. Anemia of chronic kidney disease Normocytic Lab Results  Component Value Date   HGB 7.7 (L) 07/14/2021   EPO with HD treatment on 8/18   4.  Diabetes mellitus type II with chronic kidney disease insulin dependent. Most recent hemoglobin A1c is 8.3% on 06/12/21.  Metformin held    5.Bilateral Nephrolithiasis- with obstructive uropathy and left renal abscess Right - Mild to moderate chronic hydronephrosis without obstructing stone Left-mild left hydronephrosis with 14 mm stone at left UPJ- b/l ureteral stent placed 8/10 Lesion at upper pole of left kidney enlarged - likely hematoma or abscess  5. Hypertension with tachycardia: 151/61. Continue clonidine, amlodipine, and metoprolol. Will consider higher dose of metoprolol if needed.      LOS: [place] 8/20/20229:36 AM

## 2021-07-15 NOTE — Progress Notes (Signed)
Report given to [place] at [facility]

## 2021-07-15 NOTE — Progress Notes (Signed)
Given Ms. Wohlford's fever above 101 this afternoon, will obtain blood cultures and urinalysis. Obtain CT a/p to reassess known renal abscess versus phlegmon to assess for interval change. Restart Zosyn, add single dose of vancomycin although suspected site of infection is intraabdominal. Continue fluconazole for Candida glabrata cultured from bilateral kidneys.  If phlegmon remains present on CT, would be advisable to reach back out to Interventional Radiology to discuss drain placement (they previously declined) given her overall clinical improvement.  Bennie Pierini, MD 07/15/21 3:59 PM

## 2021-07-15 NOTE — Progress Notes (Signed)
NAME:  Heather Lopez, MRN:  [id number], DOB:  11-Jan-1946, LOS: 46 ADMISSION DATE:  06/27/2021, CONSULTATION DATE:  06/30/21 REFERRING MD:  Hampton Abbot, MD  CHIEF COMPLAINT:  Abdominal Pain   HPI  75 y.o. with significant PMH as below who presented to the ED on 06/27/21 with chief complaints of progressive shortness of breath, productive cough, loss of appetite, nausea, dry heaving fatigue, malaise and generalized body aches. Patient initially went to her PCP on 7/29 with complaints of rhinorrhea, harsh cough, loss of appetite, nausea, fatigue, malaise and generalized body aches x2 days, patient was diagnosed with pneumonia of which she received ceftriaxone 1 mg IM and sent home with Levaquin, prednisone and Tessalon.   Past Medical History  Diabetes mellitus type 2 Anxiety and depression Hypertension Hyperlipidemia Hydronephrosis and recurrent UTI Gout and osteoarthritis  Significant Hospital Events   8/2: Admitted to MedSurg unit with sepsis 8/3: Vascular consulted for HD catheter 8/4: Patient developed acute abdomen.  CT abdomen shows viscus perforation.  Patient taken to the OR urgently for exploratory lap.  Patient return to ICU on vent.  PCCM consulted 07/01/21- patient had SBT today for recruitment and chest physiotherapy.  Still has significant tachycardia and hypertension when awake (possible pain related) but mentation is good able to follow verbal communication.  07/02/21-patient has failed SBT today due to hypoxemia.  Reviewed care plan with surgery team.  07/03/21- Lung V/Q scan obtained ~ negative for PE.  Weaning FiO2, currently at 50% 8/9 severe resp failure, remains on vent 8/10 s/p Cystoscopy with Bilateral ureteral stent placement (49F/24 cm) for LEFT OBSTRUCTED KIDNEY STONE 8/11 remains on VENT 8/11 failed SAT/SBT  8/12 started on insulin drip for FSBS >400, developed A. fib with RVR CT abdomen pelvis showed decreasing size of left suprarenal collection 8/13 transitioned off insulin, rate  relatively controlled 8/14 issues with increased heart rate when Cardizem decreased.  Currently at 10 mg/h plus Lopressor IV every 6H, consider transitioning Cardizem to via OG in a.m. 8/15 failing weaning trials 8/16 failed weaning trials discussed with family-likely needs [facility] 8/19 extubated to BiPAP   Consults:  Nephrology Vascular PCCM General Surgery Urology  Procedures:  8/5: Exploratory Lap  8/10: Cystoscopy, bilateral ureteral stent placement, bilateral retrograde pyelography   Significant Diagnostic Tests:  8/2: Chest Xray>left basilar consolidation or atelectasis and suspected small left pleural effusion 8/2: Renal ultrasound> moderate right hydronephrosis, mild left hydronephrosis new with bilateral nephrolithiasis 8/4: CTA abdomen and pelvis> Extensive pneumoperitoneum compatible with perforated viscus. Gastric perforation is suspected, though bowel wall defect is not definitively identified. Surgical consultation is recommended. 2. Large ill-defined mass arising from the upper pole left kidney consistent with renal cell carcinoma. Dedicated renal MRI may be useful when clinical situation permits. 3. Acute left-sided hydronephrosis due to a 12 mm left UPJ calculus. 4. Chronic right hydronephrosis and hydroureter without clear etiology. 5. Bilateral nonobstructing renal calculi as above. 6. Small volume ascites. 7. Trace bilateral pleural effusions with patchy bibasilar atelectasis. 8/7: Venous US BLE>> negative for DVT 8/8: Lung V/Q Scan>>Negative for pulmonary embolus.  Micro Data:  8/2: SARS-CoV-2 PCR> negative 8/2: Influenza PCR> negative 8/2: Blood culture x2> no growth 8/2: Urine Culture> 30,000 colonies yeast 8/2: Legionella urinary antigen> negative 8/10: 70,000 colonies yeast, ID pend  Antimicrobials:  8/4 Fluconazole> increased to high dose 8/12> 8/4 Zosyn>8/13   HPI/INTERVAL CHANGES Extubate to BiPAP yesterday. Comfortable on nasal cannula this  morning. Low grade temp to 100.43F following extubation. She is making good urine. She  has been hypertensive and mildly tachycardic. Awaiting PT/ST.  REVIEW OF SYSTEMS  PATIENT IS UNABLE TO PROVIDE COMPLETE REVIEW OF SYSTEMS DUE TO SEVERE CRITICAL ILLNESS AND TOXIC METABOLIC ENCEPHALOPATHY  PHYSICAL EXAMINATION:  GENERAL: obese female in NAD EYES: Pupils equal, round, reactive to light.  No scleral icterus.  MOUTH: Moist mucosal membranes PULMONARY: CTA b/l, no W/C/R CARDIOVASCULAR: S1 and S2.  No murmurs  GASTROINTESTINAL: mildly distended, Midline incision with dressing dry and intact. JP Drains x 2 MUSCULOSKELETAL: trace pitting edema NEUROLOGIC: awake, difficulty speaking post-extubation but able to verbalize, shaking head appropriately SKIN:intact,warm,dry    OBJECTIVE  Blood pressure (!) 165/68, pulse (!) 113, temperature (!) 100.7 F (38.2 C), temperature source Axillary, resp. rate (!) 24, height 5' 2.01" (1.575 m), weight 114 kg, SpO2 100 %.    FiO2 (%):  [24 %] 24 %   Intake/Output Summary (Last 24 hours) at 07/15/2021 1006 Last data filed at 07/15/2021 0800 Gross per 24 hour  Intake 1086.59 ml  Output 2700 ml  Net -1613.41 ml   Filed Weights   07/12/21 0436 07/13/21 0500 07/14/21 0500  Weight: 107.1 kg 112 kg 114 kg      Labs   CBC: Recent Labs  Lab 07/10/21 0503 07/11/21 0453 07/11/21 1510 07/12/21 0501 07/13/21 0435 07/14/21 0429  WBC 10.5 7.5  --  7.5 5.6 5.0  HGB 7.1* 6.5* 8.6* 8.1* 7.5* 7.7*  HCT 22.3* 20.0* 26.3* 24.7* 23.2* 24.2*  MCV 86.4 87.3  --  84.6 86.9 86.4  PLT 171 143*  --  162 180 510    Basic Metabolic Panel: Recent Labs  Lab 07/10/21 0503 07/11/21 0453 07/11/21 1510 07/12/21 0501 07/13/21 0435 07/14/21 0429  NA 141 141 140 142 146* 143  K 3.3* 2.9* 3.7 3.4* 3.5 3.4*  CL 112* 107 106 107 113* 104  CO2 18* '26 24 25 26 29  ' GLUCOSE 256* 183* 197* 185* 235* 139*  BUN 131* 79* 81* 84* 106* 53*  CREATININE 3.35* 2.12* 2.17*  2.17* 2.45* 1.63*  CALCIUM 7.8* 7.8* 8.3* 8.4* 8.3* 8.1*  MG 2.0 1.7 1.7 1.7 2.0  --   PHOS 3.9 3.3 3.2 2.7 3.1  --    GFR: Estimated Creatinine Clearance: 36.2 mL/min (A) (by C-G formula based on SCr of 1.63 mg/dL (H)). Recent Labs  Lab 07/11/21 0453 07/12/21 0501 07/13/21 0435 07/14/21 0429  WBC 7.5 7.5 5.6 5.0    Liver Function Tests: Recent Labs  Lab 07/13/21 0500  AST 52*  ALT 114*  ALKPHOS 107  BILITOT 0.5  PROT 5.3*  ALBUMIN 1.7*   ABG    Component Value Date/Time   PHART 7.25 (L) 07/06/2021 0951   PCO2ART 46 07/06/2021 0951   PO2ART 57 (L) 07/06/2021 0951   HCO3 20.2 07/06/2021 0951   ACIDBASEDEF 6.7 (H) 07/06/2021 0951   O2SAT 83.9 07/06/2021 0951      Allergies Allergies  Allergen Reactions   Contrast Media  [Iodinated Diagnostic Agents] Anaphylaxis   Iodine Swelling    (IV only) - angioedema    Scheduled Meds:  amLODipine  10 mg Per Tube Daily   chlorhexidine gluconate (MEDLINE KIT)  15 mL Mouth Rinse BID   Chlorhexidine Gluconate Cloth  6 each Topical Q0600   cloNIDine  0.2 mg Per Tube Daily   feeding supplement (PROSource TF)  45 mL Per Tube Daily   free water  50 mL Per Tube Q4H   heparin injection (subcutaneous)  5,000 Units Subcutaneous Q8H   insulin  aspart  0-20 Units Subcutaneous Q4H   insulin aspart  12 Units Subcutaneous Q4H   insulin detemir  46 Units Subcutaneous BID   mouth rinse  15 mL Mouth Rinse 10 times per day   metoprolol tartrate  25 mg Per Tube BID   multivitamin  1 tablet Per Tube QHS   nutrition supplement (JUVEN)  1 packet Per Tube BID BM   oxyCODONE  5-10 mg Per Tube Q6H   Continuous Infusions:  sodium chloride     feeding supplement (PIVOT 1.5 CAL) 50 mL/hr at 07/14/21 1600   fluconazole (DIFLUCAN) IV Stopped (07/14/21 2126)   PRN Meds:.sodium chloride, heparin, hydrALAZINE, lidocaine (PF), lidocaine-prilocaine, ondansetron (ZOFRAN) IV, pentafluoroprop-tetrafluoroeth  Active Hospital Problem list   Acute  hypoxic respiratory failure ventilator dependent Severe sepsis with septic shock, resolved Bowel perforation s/p ex-lap AKI on CKD stage IV > iHD Right UPJ stone with hydronephrosis s/p stent placement Left renal abscess Anion gap metabolic acidosis, resolved Lactic acidosis, resolved Diabetes mellitus A. fib flutter with RVR, resolved Candida glabrata pyelonephrosis   Assessment & Plan:   75 y.o. F Acute Hypoxic Respiratory Failure in setting of Community Acquired Pneumonia,  bilateral pleural effusions with associated atelectasis complicated by  perforated Duodenal Ulcer with peritonitis Status post laparotomy Status post ureteral stent placement Left renal abscess, appears to be resolving Candiduria  Failure to wean from vent -S/p exploratory laparotomy and primary repair of duodenal ulcer with omental patch on 06/29/21 -S/P bilateral ureteral stent placement 8/10   Severe ACUTE Hypoxic and Hypercapnic Respiratory Failure, RESOLVED -EXTUBATED 8/19 -Wean FiO2 for goal SpO2 >=90%  SEPTIC shock-resolving SOURCE-Severe Sepsis secondary to UTI, Community Acquired Pneumonia and perforated Duodenal Ulcer with peritonitis -ID following for antibiotic management -Fluconazole per ID  ACUTE SYSTOLIC CARDIAC FAILURE AFIB WITH RVR, resolved HYPERTENSION -aim for daily negative fluid balance -increase Lopressor to 50 mg BID -continue amlodipine 10 mg daily   ACUTE KIDNEY INJURY/Renal Failure -continue Foley Catheter-assess need -Avoid nephrotoxic agents -Follow urine output, BMP -Ensure adequate renal perfusion, optimize oxygenation -Renal dose medications -PermCath replaced 8/19   Intake/Output Summary (Last 24 hours) at 07/15/2021 1006 Last data filed at 07/15/2021 0800 Gross per 24 hour  Intake 1086.59 ml  Output 2700 ml  Net -1613.41 ml    ACUTE ANEMIA- Transfuse for Hgb <7  ENDO - ICU hypoglycemic\Hyperglycemia protocol -check FSBS per  protocol  ELECTROLYTES -follow labs as needed -replace as needed -pharmacy consultation and following   Best practice:  Diet:  Tube Feed ; awaiting ST eval Pain/Anxiety/Delirium protocol (if indicated): No VAP protocol (if indicated): Not indicated DVT prophylaxis: SQH GI prophylaxis: PPI Glucose control:  SSI Yes Central venous access:  Yes, and it is still needed Arterial line:  N/A Foley:  Yes, and it is still needed Mobility:  OOB with PT  PT consulted: Yes Last date of multidisciplinary goals of care discussion [8/19] Code Status:  full code Disposition: ICU, can transfer to floor   DVT/GI PRX  assessed I Assessed the need for Labs I Assessed the need for Foley I Assessed the need for Central Venous Line Family Discussion when available I Assessed the need for Mobilization I made an Assessment of medications to be adjusted accordingly Safety Risk assessment completed   Critical Care Time devoted to patient care services described in this note is 41 minutes.  Critical care was necessary to treat /prevent imminent and life-threatening deterioration. Overall, patient is critically ill, prognosis is guarded.  Patient with Multiorgan failure  and at high risk for cardiac arrest and death.    Otelia Limes, M.D.  [facility]

## 2021-07-15 NOTE — Progress Notes (Signed)
Pharmacy Antibiotic Note  Heather Lopez is a 75 y.o. female admitted on 06/27/2021 with duodenal perforation. S/P surgery to close perforation on 06/30/2021. Also treated for bilateral nephrolithiasis and candida glabrata pyelonephritis. ID has been following. Patient developed fever this afternoon (8/20). Provider wishes to restart Zosyn for suspected intraabdominal infection. Vancomycin 2000mg  x 1 dose has also been ordered.   Plan: Start Zosyn 3.375 IV EI every 8 hours.   Height: 5' 2.01" (157.5 cm) Weight: 114 kg (251 lb 5.2 oz) IBW/kg (Calculated) : 50.12  Temp (24hrs), Avg:101 F (38.3 C), Min:100.6 F (38.1 C), Max:101.8 F (38.8 C)  Recent Labs  Lab 07/11/21 0453 07/11/21 1510 07/12/21 0501 07/13/21 0435 07/14/21 0429 07/15/21 1036  WBC 7.5  --  7.5 5.6 5.0 5.2  CREATININE 2.12* 2.17* 2.17* 2.45* 1.63* 1.97*    Estimated Creatinine Clearance: 29.9 mL/min (A) (by C-G formula based on SCr of 1.97 mg/dL (H)).    Allergies  Allergen Reactions   Contrast Media  [Iodinated Diagnostic Agents] Anaphylaxis   Iodine Swelling    (IV only) - angioedema    Antimicrobials this admission: 8/2 Azithromycin >> 8/4 8/2 CTX>>8/4 8/4 Zosyn>> 8/13 8/5 Fluconazole>>  8/20 Zosyn >> 8/20 vancomycin x 1  Microbiology results: 8/20 BCx: pending  8/10 UCx: Candida glabrata  8/5 MRSA PCR: negative   Thank you for allowing pharmacy to be a part of this patient's care.  Pernell Dupre, PharmD, BCPS Clinical Pharmacist 07/15/2021 4:27 PM

## 2021-07-15 NOTE — Progress Notes (Signed)
[place] Hospital Day(s): 18.   Post op day(s): 15 Days Post-Op.   Interval History:  Patient seen and examined Had tunneled HD catheter placed 07/13/21 Successfully extubated yesterday and remains without need for ventilator support. She remains without leukocytosis Hgb stable, Renal function slightly worse today, with Cr 1.97 Surgical drains:             both serous.  Total of 120 out Tube feedings 50 ccs/hr; at goal  She is having bowel function; multiple large loose stools daily; rectal tube placed  Vital signs in last 24 hours: [min-max] current  Temp:  [99 F (37.2 C)-100.7 F (38.2 C)] 100.6 F (38.1 C) (08/20 1236) Pulse Rate:  [94-117] 113 (08/20 1236) Resp:  [18-30] 18 (08/20 1236) BP: (151-201)/(51-79) 153/71 (08/20 1236) SpO2:  [91 %-100 %] 91 % (08/20 1236) FiO2 (%):  [24 %] 24 % (08/19 1600)     Height: 5' 2.01" (157.5 cm) Weight: 114 kg BMI (Calculated): 45.96   Intake/Output last 2 shifts:  08/19 0701 - 08/20 0700 In: 371.6 [I.V.:36.6; NG/GT:335] Out: 2870 [Urine:2750; Drains:120]   Physical Exam:  Constitutional: i opens eyes to voice, not following commands (nurse reports that he very recently gave her oxycodone and that she was following commands earlier) HEENT: NGT in place; receiving tube feedings Respiratory: normal WOB on nasal cannula O2. Cardiovascular: regular rate, sinus rhythm Gastrointestinal: Soft, unable to reliably assess tenderness, non-distended, surgical drains x2 in right lower abdomen, output serous in both  Genitourinary: Foley in place  Integumentary: Midline incision is well healed, tiny skin separation at the upper quarter of wound, no purulent drainage or discharge.  Labs:  CBC Latest Ref Rng & Units 07/15/2021 07/14/2021 07/13/2021  WBC 4.0 - 10.5 K/uL 5.2 5.0 5.6  Hemoglobin 12.0 - 15.0 g/dL 8.5(L) 7.7(L) 7.5(L)  Hematocrit 36.0 - 46.0 % 26.6(L) 24.2(L) 23.2(L)  Platelets 150 - 400  K/uL 267 186 180   CMP Latest Ref Rng & Units 07/15/2021 07/14/2021 07/13/2021  Glucose 70 - 99 mg/dL 208(H) 139(H) 235(H)  BUN 8 - 23 mg/dL 67(H) 53(H) 106(H)  Creatinine 0.44 - 1.00 mg/dL 1.97(H) 1.63(H) 2.45(H)  Sodium 135 - 145 mmol/L 145 143 146(H)  Potassium 3.5 - 5.1 mmol/L 4.0 3.4(L) 3.5  Chloride 98 - 111 mmol/L 104 104 113(H)  CO2 22 - 32 mmol/L 28 29 26   Calcium 8.9 - 10.3 mg/dL 8.4(L) 8.1(L) 8.3(L)  Total Protein 6.5 - 8.1 g/dL 5.8(L) - 5.3(L)  Total Bilirubin 0.3 - 1.2 mg/dL 0.7 - 0.5  Alkaline Phos 38 - 126 U/L 124 - 107  AST 15 - 41 U/L 59(H) - 52(H)  ALT 0 - 44 U/L 139(H) - 114(H)     Imaging studies: No new pertinent imaging studies   Assessment/Plan:  75 y.o.  female 13 days s/p exploratory laparotomy and primary repair of duodenal ulcer with omental patch   -Extubated yesterday and has remained without need for ventilator support.  - Appreciate palliative care assistance  - Staples removed okay to be open to air, can cover with dry gauze prn              - Continue tube feedings; at goal   - Okay to continue stool management device  - Appreciate urology assistance; follow up UCx (growing yeast); on Diflucan  - Monitor abdominal examination; on-going bowel function - Maintain surgical drains; monitor and record output    All of the above findings and  recommendations were discussed with the medical team.

## 2021-07-15 NOTE — Progress Notes (Signed)
SLP Cancellation Note  Patient Details Name: Heather Lopez MRN: [id number] DOB: 1946-03-06   Cancelled treatment:       Reason Eval/Treat Not Completed: (P) Patient not medically ready. Swallow orders received. Pt extubated yesterday to BiPap, on HFNC since this morning. Pt currently nutritionally supported with NG; attempted visit however pt extremely lethargic, open-mouth breathing. Not appropriate for PO trials at this time. D/W RN; will follow up for assessment Monday; pt likely to need instrumental study when appropriate given lengthy intubation.  Deneise Lever, [place], [place] E Rolla Kedzierski 07/15/2021, 11:55 AM

## 2021-07-15 NOTE — Progress Notes (Signed)
   07/15/21 1521  Assess: MEWS Score  Temp (!) 101.8 F (38.8 C)  BP (!) 159/69  Pulse Rate (!) 117  Resp 20  SpO2 93 %  O2 Device Nasal Cannula  O2 Flow Rate (L/min) 4 L/min  Assess: MEWS Score  MEWS Temp 2  MEWS Systolic 0  MEWS Pulse 2  MEWS RR 0  MEWS LOC 0  MEWS Score 4  MEWS Score Color Red  Assess: if the MEWS score is Yellow or Red  Were vital signs taken at a resting state? Yes  Focused Assessment No change from prior assessment  Does the patient meet 2 or more of the SIRS criteria? Yes  Does the patient have a confirmed or suspected source of infection? Yes  Provider and Rapid Response Notified? Yes (MD [place], [place] Katie RN)  MEWS guidelines implemented *See Row Information* No, previously red, continue vital signs every 4 hours (within past 24 hrs)  Treat  MEWS Interventions Administered prn meds/treatments  Assess: SIRS CRITERIA  SIRS Temperature  1  SIRS Pulse 1  SIRS Respirations  0  SIRS WBC 0  SIRS Score Sum  2

## 2021-07-15 NOTE — Evaluation (Signed)
Physical Therapy Evaluation Patient Details Name: Heather Lopez MRN: [id number] DOB: 12/31/1945 Today's Date: 07/15/2021   History of Present Illness  Pt is a [name] who presented to the ED on 06/27/21 with chief complaints of progressive shortness of breath, productive cough, loss of appetite, nausea, dry heaving fatigue, malaise and generalized body aches. Patient initially went to her PCP on 7/29 with complaints of rhinorrhea, harsh cough, loss of appetite, nausea, fatigue, malaise and generalized body aches x2 days, patient was diagnosed with pneumonia of which she received ceftriaxone 1 mg IM and sent home with Levaquin, prednisone and Tessalon. On 8/4 pt developed acute abdominal pain with CT showing viscus perforation. Pt urgently taken to the OR for ex lap. Pt is also s/p cystoscopy with B ureteral stent placement for L obstructed kidney stone on 8/10.  Clinical Impression  Pt seen for PT evaluation with pt's husband arriving at end of session & providing PLOF & home set up information. Prior to admission pt was able to ambulate short distances with bilateral SPC's & was scheduled for a hip replacement August 24th. On this date pt requires +2 assist for supine<>sit & to sit EOB ~3 minutes with cuing for core/trunk activation to correct posterior LOB. Pt demonstrates global weakness in all extremities but is able to wiggle digits & toes. Pt with BLE hip/knee extension preference, grimacing when PT attempts to flex joints. Pt with limited to no verbal communication during session. Educated pt's husband on recommendation of STR upon d/c to maximize independence with functional mobility, reduce fall risk, & decrease caregiver burden prior to return home.     Follow Up Recommendations SNF;Supervision/Assistance - 24 hour    Equipment Recommendations  None recommended by PT (TBD in next venue)    Recommendations for Other Services       Precautions / Restrictions Precautions Precautions:  Fall Precaution Comments: NG tube, abdominal incision & JP drains, foley catheter, fecal tube, L jugular tunneled hemodialysis catheter Restrictions Weight Bearing Restrictions: No      Mobility  Bed Mobility Overal bed mobility: Needs Assistance Bed Mobility: Supine to Sit;Sit to Supine     Supine to sit: Total assist;+2 for physical assistance Sit to supine: +2 for physical assistance;Total assist        Transfers Overall transfer level:  (not safe to attempt)                  Ambulation/Gait                Stairs            Wheelchair Mobility    Modified Rankin (Stroke Patients Only)       Balance Overall balance assessment: Needs assistance Sitting-balance support: Feet unsupported;Bilateral upper extremity supported Sitting balance-Leahy Scale: Zero Sitting balance - Comments: max assist, cuing for anterior shifting/trunk activation Postural control: Posterior lean                                   Pertinent Vitals/Pain Pain Assessment: Faces Faces Pain Scale: Hurts even more Pain Location: BLE when PT performs PROM Pain Descriptors / Indicators: Discomfort;Grimacing;Guarding (resistant) Pain Intervention(s): Monitored during session;Repositioned    Home Living Family/patient expects to be discharged to:: Private residence Living Arrangements: Spouse/significant other Available Help at Discharge: Family;Available 24 hours/day Type of Home: House Home Access: Stairs to enter   [employer] of Steps: 1 small threshold  step Home Layout: Multi-level;Able to live on main level with bedroom/bathroom Home Equipment: Walker - 4 wheels;Cane - single point;Wheelchair - manual      Prior Function Level of Independence: Needs assistance   Gait / Transfers Assistance Needed: Pt required assistance for transfers. Pt ambulated short distances in house with bilateral SPCs & assist from husband, otherwise he would push  her around in w/c. Prior to husband Dietitian bedroom on first floor pt was able to negotiate flight of steps to bedroom at night with step to pattern. Pt's husband reports max ambulation distance was 20 ft in church with [facility] without assistance. Notes pt was scheduled for hip replacement August 24 prior to these events.           Hand Dominance        Extremity/Trunk Assessment   Upper Extremity Assessment Upper Extremity Assessment:  (pt unable to lift either UE against gravity when supine in bed)    Lower Extremity Assessment Lower Extremity Assessment:  (pt able to wiggle toes on BLE but only active movement of LLE dorsiflexors (1/5) noted while sitting EOB, otherwise no active movement noted. Pt with BLE knee extension preference (grimaces when PT attempts to flex hips/knees))    Cervical / Trunk Assessment Cervical / Trunk Assessment:  (rounded shoulders)  Communication   Communication:  (only verbalized "yes" during session, maintains mouth open throughout session.)  Cognition Arousal/Alertness: Awake/alert Behavior During Therapy: Flat affect Overall Cognitive Status: Impaired/Different from baseline Area of Impairment: Orientation;Memory;Attention;Safety/judgement;Following commands;Awareness;Problem solving                 Orientation Level: Disoriented to;Place;Time;Situation   Memory: Decreased short-term memory;Decreased recall of precautions Following Commands: Follows one step commands inconsistently;Follows one step commands with increased time Safety/Judgement: Decreased awareness of safety;Decreased awareness of deficits Awareness: Anticipatory;Intellectual;Emergent Problem Solving: Slow processing;Decreased initiation;Difficulty sequencing;Requires verbal cues;Requires tactile cues        General Comments General comments (skin integrity, edema, etc.): Nurse placed pt on 4L/min via [place] during session, SpO2 >90%, max HR 129 bpm, BP at beginning of  session: 167/63 mmHg (MAP 91) in LUE forearm; pt noted to have L shoulder twitching (husband said this occurred yesterday) & small open wound along abdominal incision & nurse made aware    Exercises     Assessment/Plan    PT Assessment Patient needs continued PT services  PT Problem List Decreased strength;Decreased mobility;Decreased safety awareness;Decreased range of motion;Decreased activity tolerance;Decreased balance;Decreased knowledge of use of DME;Pain;Cardiopulmonary status limiting activity;Decreased skin integrity       PT Treatment Interventions DME instruction;Therapeutic activities;Cognitive remediation;Modalities;Gait training;Therapeutic exercise;Patient/family education;Stair training;Balance training;Wheelchair mobility training;Functional mobility training;Manual techniques;Neuromuscular re-education    PT Goals (Current goals can be found in the Care Plan section)  Acute Rehab PT Goals Patient Stated Goal: get better/stronger PT Goal Formulation: With family Time For Goal Achievement: 07/29/21 Potential to Achieve Goals: Fair    Frequency Min 2X/week   Barriers to discharge Decreased caregiver support;Inaccessible home environment      Co-evaluation               AM-PAC PT "6 Clicks" Mobility  Outcome Measure Help needed turning from your back to your side while in a flat bed without using bedrails?: Total Help needed moving from lying on your back to sitting on the side of a flat bed without using bedrails?: Total Help needed moving to and from a bed to a chair (including a wheelchair)?: Total Help needed standing up from  a chair using your arms (e.g., wheelchair or bedside chair)?: Total Help needed to walk in hospital room?: Total Help needed climbing 3-5 steps with a railing? : Total 6 Click Score: 6    End of Session Equipment Utilized During Treatment: Oxygen Activity Tolerance: Patient tolerated treatment well;Patient limited by  fatigue Patient left: in bed;with call bell/phone within reach;with bed alarm set;with family/visitor present Nurse Communication: Mobility status PT Visit Diagnosis: Muscle weakness (generalized) (M62.81);Difficulty in walking, not elsewhere classified (R26.2)    Time: 3762-8315 PT Time Calculation (min) (ACUTE ONLY): 30 min   Charges:   PT Evaluation $PT Eval High Complexity: 1 High PT Treatments $Therapeutic Activity: 23-37 mins        Lavone Nian, PT, DPT 07/15/21, 10:23 AM   Waunita Schooner 07/15/2021, 10:20 AM

## 2021-07-16 ENCOUNTER — Encounter: Payer: Self-pay | Admitting: Internal Medicine

## 2021-07-16 ENCOUNTER — Inpatient Hospital Stay: Payer: Medicare Other

## 2021-07-16 DIAGNOSIS — Z794 Long term (current) use of insulin: Secondary | ICD-10-CM

## 2021-07-16 DIAGNOSIS — E1122 Type 2 diabetes mellitus with diabetic chronic kidney disease: Secondary | ICD-10-CM

## 2021-07-16 DIAGNOSIS — L89152 Pressure ulcer of sacral region, stage 2: Secondary | ICD-10-CM

## 2021-07-16 DIAGNOSIS — N184 Chronic kidney disease, stage 4 (severe): Secondary | ICD-10-CM

## 2021-07-16 DIAGNOSIS — R5383 Other fatigue: Secondary | ICD-10-CM

## 2021-07-16 DIAGNOSIS — K265 Chronic or unspecified duodenal ulcer with perforation: Secondary | ICD-10-CM

## 2021-07-16 LAB — CBC
HCT: 28.8 % — ABNORMAL LOW (ref 36.0–46.0)
Hemoglobin: 8.9 g/dL — ABNORMAL LOW (ref 12.0–15.0)
MCH: 27.4 pg (ref 26.0–34.0)
MCHC: 30.9 g/dL (ref 30.0–36.0)
MCV: 88.6 fL (ref 80.0–100.0)
Platelets: 328 10*3/uL (ref 150–400)
RBC: 3.25 MIL/uL — ABNORMAL LOW (ref 3.87–5.11)
RDW: 16.6 % — ABNORMAL HIGH (ref 11.5–15.5)
WBC: 6.4 10*3/uL (ref 4.0–10.5)
nRBC: 0 % (ref 0.0–0.2)

## 2021-07-16 LAB — COMPREHENSIVE METABOLIC PANEL
ALT: 92 U/L — ABNORMAL HIGH (ref 0–44)
AST: 50 U/L — ABNORMAL HIGH (ref 15–41)
Albumin: 1.7 g/dL — ABNORMAL LOW (ref 3.5–5.0)
Alkaline Phosphatase: 108 U/L (ref 38–126)
Anion gap: 9 (ref 5–15)
BUN: 86 mg/dL — ABNORMAL HIGH (ref 8–23)
CO2: 30 mmol/L (ref 22–32)
Calcium: 8.7 mg/dL — ABNORMAL LOW (ref 8.9–10.3)
Chloride: 108 mmol/L (ref 98–111)
Creatinine, Ser: 2.08 mg/dL — ABNORMAL HIGH (ref 0.44–1.00)
GFR, Estimated: 25 mL/min — ABNORMAL LOW (ref >60)
Glucose, Bld: 124 mg/dL — ABNORMAL HIGH (ref 70–99)
Potassium: 3.7 mmol/L (ref 3.5–5.1)
Sodium: 147 mmol/L — ABNORMAL HIGH (ref 135–145)
Total Bilirubin: 0.4 mg/dL (ref 0.3–1.2)
Total Protein: 5.8 g/dL — ABNORMAL LOW (ref 6.5–8.1)

## 2021-07-16 LAB — ANTIFUNGAL AST 9 DRUG PANEL
Amphotericin B MIC: 0.5
Fluconazole Islt MIC: 2
Flucytosine MIC: 0.06
Itraconazole MIC: 0.12
Posaconazole MIC: 0.25
Voriconazole MIC: 0.06

## 2021-07-16 LAB — GLUCOSE, CAPILLARY
Glucose-Capillary: 125 mg/dL — ABNORMAL HIGH (ref 70–99)
Glucose-Capillary: 134 mg/dL — ABNORMAL HIGH (ref 70–99)
Glucose-Capillary: 140 mg/dL — ABNORMAL HIGH (ref 70–99)
Glucose-Capillary: 145 mg/dL — ABNORMAL HIGH (ref 70–99)
Glucose-Capillary: 147 mg/dL — ABNORMAL HIGH (ref 70–99)
Glucose-Capillary: 93 mg/dL (ref 70–99)
Glucose-Capillary: 97 mg/dL (ref 70–99)

## 2021-07-16 LAB — BASIC METABOLIC PANEL
Anion gap: 15 (ref 5–15)
BUN: 79 mg/dL — ABNORMAL HIGH (ref 8–23)
CO2: 28 mmol/L (ref 22–32)
Calcium: 8.9 mg/dL (ref 8.9–10.3)
Chloride: 106 mmol/L (ref 98–111)
Creatinine, Ser: 1.95 mg/dL — ABNORMAL HIGH (ref 0.44–1.00)
GFR, Estimated: 27 mL/min — ABNORMAL LOW (ref >60)
Glucose, Bld: 149 mg/dL — ABNORMAL HIGH (ref 70–99)
Potassium: 3.8 mmol/L (ref 3.5–5.1)
Sodium: 149 mmol/L — ABNORMAL HIGH (ref 135–145)

## 2021-07-16 LAB — BLOOD GAS, ARTERIAL
Acid-Base Excess: 6.3 mmol/L — ABNORMAL HIGH (ref 0.0–2.0)
Bicarbonate: 30.6 mmol/L — ABNORMAL HIGH (ref 20.0–28.0)
FIO2: 0.32
O2 Saturation: 95.1 %
Patient temperature: 37
pCO2 arterial: 42 mmHg (ref 32.0–48.0)
pH, Arterial: 7.47 — ABNORMAL HIGH (ref 7.350–7.450)
pO2, Arterial: 71 mmHg — ABNORMAL LOW (ref 83.0–108.0)

## 2021-07-16 LAB — CBC WITH DIFFERENTIAL/PLATELET
Abs Immature Granulocytes: 0.03 10*3/uL (ref 0.00–0.07)
Basophils Absolute: 0 10*3/uL (ref 0.0–0.1)
Basophils Relative: 1 %
Eosinophils Absolute: 0.2 10*3/uL (ref 0.0–0.5)
Eosinophils Relative: 4 %
HCT: 25.4 % — ABNORMAL LOW (ref 36.0–46.0)
Hemoglobin: 7.9 g/dL — ABNORMAL LOW (ref 12.0–15.0)
Immature Granulocytes: 1 %
Lymphocytes Relative: 15 %
Lymphs Abs: 0.9 10*3/uL (ref 0.7–4.0)
MCH: 27.7 pg (ref 26.0–34.0)
MCHC: 31.1 g/dL (ref 30.0–36.0)
MCV: 89.1 fL (ref 80.0–100.0)
Monocytes Absolute: 0.6 10*3/uL (ref 0.1–1.0)
Monocytes Relative: 9 %
Neutro Abs: 4.5 10*3/uL (ref 1.7–7.7)
Neutrophils Relative %: 70 %
Platelets: 328 10*3/uL (ref 150–400)
RBC: 2.85 MIL/uL — ABNORMAL LOW (ref 3.87–5.11)
RDW: 16.1 % — ABNORMAL HIGH (ref 11.5–15.5)
WBC: 6.3 10*3/uL (ref 4.0–10.5)
nRBC: 0 % (ref 0.0–0.2)

## 2021-07-16 LAB — PHOSPHORUS: Phosphorus: 3.2 mg/dL (ref 2.5–4.6)

## 2021-07-16 LAB — MAGNESIUM: Magnesium: 2 mg/dL (ref 1.7–2.4)

## 2021-07-16 LAB — APTT: aPTT: 33 seconds (ref 24–36)

## 2021-07-16 LAB — PROTIME-INR
INR: 1.2 (ref 0.8–1.2)
Prothrombin Time: 15.5 seconds — ABNORMAL HIGH (ref 11.4–15.2)

## 2021-07-16 LAB — LACTIC ACID, PLASMA: Lactic Acid, Venous: 1.9 mmol/L (ref 0.5–1.9)

## 2021-07-16 MED ORDER — ACETAMINOPHEN 160 MG/5ML PO SOLN
650.0000 mg | Freq: Four times a day (QID) | ORAL | Status: DC | PRN
  Administered 2021-07-16 – 2021-07-30 (×8): 650 mg
  Filled 2021-07-16 (×11): qty 20.3

## 2021-07-16 MED ORDER — INSULIN DETEMIR 100 UNIT/ML ~~LOC~~ SOLN
35.0000 [IU] | Freq: Two times a day (BID) | SUBCUTANEOUS | Status: DC
  Administered 2021-07-16: 35 [IU] via SUBCUTANEOUS
  Filled 2021-07-16 (×2): qty 0.35

## 2021-07-16 MED ORDER — OXYCODONE HCL 5 MG/5ML PO SOLN
10.0000 mg | Freq: Four times a day (QID) | ORAL | Status: DC | PRN
Start: 2021-07-16 — End: 2021-07-17

## 2021-07-16 MED ORDER — OXYCODONE HCL 5 MG/5ML PO SOLN: 5.0000 mg | Freq: Four times a day (QID) | ORAL | Status: DC | PRN

## 2021-07-16 MED ORDER — INSULIN ASPART 100 UNIT/ML IJ SOLN
8.0000 [IU] | INTRAMUSCULAR | Status: DC
  Administered 2021-07-16 (×2): 8 [IU] via SUBCUTANEOUS
  Filled 2021-07-16 (×2): qty 1

## 2021-07-16 NOTE — Progress Notes (Signed)
Chaplain responded to Rapid Response after the fact due to an urgent case in the ED.  Chaplain offered emotional support and encouragement to pt.  Pt's breathing is labored; she appears to hear questioning, but it was not clear if she understood and she did not respond to yes/no questions.  Please call as needed for support from our department.  Minus [place], [place] Pager: [phone]    07/16/21 2100  Clinical Encounter Type  Visited With Patient  Visit Type Initial;Psychological support  Referral From Nurse  Consult/Referral To Chaplain  Stress Factors  Patient Stress Factors Health changes

## 2021-07-16 NOTE — Progress Notes (Signed)
[facility]  ROUNDING NOTE   Subjective:   Heather Lopez is a 75 y.o. white female who has been admitted for community acquired pneumonia.    Hospital course complicated by duodenal perforation s/p repair on 06/30/2021 ARF - requiring HD as needed. Last hemodialysis was 8/18.  Acute resp failure requiring mech ventilation Left obstructive hydronephrosis and renal abscess, Patient underwent cystoscopy and b/l ureteral stent placement 8/10  UOP 3420m  Tmax 102.8.   Moved to 2A.    Objective:  Vital signs in last 24 hours:  Temp:  [98.5 F (36.9 C)-102.8 F (39.3 C)] 99.7 F (37.6 C) (08/21 1205) Pulse Rate:  [105-124] 113 (08/21 0752) Resp:  [17-24] 17 (08/21 1205) BP: (150-174)/(58-78) 150/58 (08/21 1205) SpO2:  [91 %-100 %] 93 % (08/21 1205)  Weight change:  Filed Weights   07/12/21 0436 07/13/21 0500 07/14/21 0500  Weight: 107.1 kg 112 kg 114 kg    Intake/Output: I/O last 3 completed shifts: In: 907.9 [NG/GT:700; IV Piggyback:207.9] Out: 6250 [Urine:5225; Drains:125; Stool:900]   Intake/Output this shift:  Total I/O In: -  Out: 6[place]o[Urine:675]  Physical Exam: General: ill  Head: [place]/AT  Eyes: Anicteric  Lungs:  +coarse breath sounds, 4L [place]  Heart: tachycardia  Abdomen:  +JP drains  Extremities:  trace peripheral edema   Neurologic:  Following commands  Skin: No lesions  GU Foley with yellow urine  Access:  RIJ temp cath, LIJ permcath not functioning.     Basic Metabolic Panel: Recent Labs  Lab 07/11/21 1510 07/12/21 0501 07/13/21 0435 07/14/21 0429 07/15/21 1036 07/16/21 0444  NA 140 142 146* 143 145 149*  K 3.7 3.4* 3.5 3.4* 4.0 3.8  CL 106 107 113* 104 104 106  CO2 '24 25 26 29 28 28  ' GLUCOSE 197* 185* 235* 139* 208* 149*  BUN 81* 84* 106* 53* 67* 79*  CREATININE 2.17* 2.17* 2.45* 1.63* 1.97* 1.95*  CALCIUM 8.3* 8.4* 8.3* 8.1* 8.4* 8.9  MG 1.7 1.7 2.0  --  1.9 2.0  PHOS 3.2 2.7 3.1  --  3.1 3.2     Liver Function  Tests: Recent Labs  Lab 07/13/21 0500 07/15/21 1036  AST 52* 59*  ALT 114* 139*  ALKPHOS 107 124  BILITOT 0.5 0.7  PROT 5.3* 5.8*  ALBUMIN 1.7* 1.7*    No results for input(s): LIPASE, AMYLASE in the last 168 hours. No results for input(s): AMMONIA in the last 168 hours.  CBC: Recent Labs  Lab 07/12/21 0501 07/13/21 0435 07/14/21 0429 07/15/21 1036 07/16/21 0444  WBC 7.5 5.6 5.0 5.2 6.4  HGB 8.1* 7.5* 7.7* 8.5* 8.9*  HCT 24.7* 23.2* 24.2* 26.6* 28.8*  MCV 84.6 86.9 86.4 87.5 88.6  PLT 162 180 186 267 328     Cardiac Enzymes: No results for input(s): CKTOTAL, CKMB, CKMBINDEX, TROPONINI in the last 168 hours.  BNP: Invalid input(s): POCBNP  CBG: Recent Labs  Lab 07/15/21 1935 07/16/21 0006 07/16/21 0355 07/16/21 0745 07/16/21 1202  GLUCAP 155* 134* 147* 125* 93     Microbiology: Results for orders placed or performed during the hospital encounter of 06/27/21  Resp Panel by RT-PCR (Flu A&B, Covid) Nasopharyngeal Swab     Status: None   Collection Time: 06/27/21 12:32 AM   Specimen: Nasopharyngeal Swab; Nasopharyngeal(NP) swabs in vial transport medium  Result Value Ref Range Status   SARS Coronavirus 2 by RT PCR NEGATIVE NEGATIVE Final    Comment: (NOTE) SARS-CoV-2 target nucleic acids are NOT  DETECTED.  The SARS-CoV-2 RNA is generally detectable in upper respiratory specimens during the acute phase of infection. The lowest concentration of SARS-CoV-2 viral copies this assay can detect is 138 copies/mL. A negative result does not preclude SARS-Cov-2 infection and should not be used as the sole basis for treatment or other patient management decisions. A negative result may occur with  improper specimen collection/handling, submission of specimen other than nasopharyngeal swab, presence of viral mutation(s) within the areas targeted by this assay, and inadequate number of viral copies(<138 copies/mL). A negative result must be combined with clinical  observations, patient history, and epidemiological information. The expected result is Negative.  Fact Sheet for Patients:  EntrepreneurPulse.com.au  Fact Sheet for Healthcare Providers:  IncredibleEmployment.be  This test is no t yet approved or cleared by the [place] FDA and  has been authorized for detection and/or diagnosis of SARS-CoV-2 by FDA under an Emergency Use Authorization (EUA). This EUA will remain  in effect (meaning this test can be used) for the duration of the COVID-19 declaration under Section 564(b)(1) of the Act, 21 U.S.C.section 360bbb-3(b)(1), unless the authorization is terminated  or revoked sooner.       Influenza A by PCR NEGATIVE NEGATIVE Final   Influenza B by PCR NEGATIVE NEGATIVE Final    Comment: (NOTE) The Xpert Xpress SARS-CoV-2/FLU/RSV plus assay is intended as an aid in the diagnosis of influenza from Nasopharyngeal swab specimens and should not be used as a sole basis for treatment. Nasal washings and aspirates are unacceptable for Xpert Xpress SARS-CoV-2/FLU/RSV testing.  Fact Sheet for Patients: EntrepreneurPulse.com.au  Fact Sheet for Healthcare Providers: IncredibleEmployment.be  This test is not yet approved or cleared by the [place] FDA and has been authorized for detection and/or diagnosis of SARS-CoV-2 by FDA under an Emergency Use Authorization (EUA). This EUA will remain in effect (meaning this test can be used) for the duration of the COVID-19 declaration under Section 564(b)(1) of the Act, 21 U.S.C. section 360bbb-3(b)(1), unless the authorization is terminated or revoked.  Performed at [facility], [address] [place], [place] [address]   Blood culture (routine x 2)     Status: None   Collection Time: 06/27/21 12:43 AM   Specimen: BLOOD  Result Value Ref Range Status   Specimen Description BLOOD RIGHT ASSIST CONTROL  Final    Special Requests   Final    BOTTLES DRAWN AEROBIC AND ANAEROBIC Blood Culture results may not be optimal due to an inadequate volume of blood received in culture bottles   Culture   Final    NO GROWTH 5 DAYS Performed at [facility], [address], [place], [place] [address]    Report Status 07/02/2021 FINAL  Final  Blood culture (routine x 2)     Status: None   Collection Time: 06/27/21  1:45 AM   Specimen: BLOOD  Result Value Ref Range Status   Specimen Description BLOOD RIGHT ASSIST CONTROL  Final   Special Requests   Final    BOTTLES DRAWN AEROBIC AND ANAEROBIC Blood Culture adequate volume   Culture   Final    NO GROWTH 5 DAYS Performed at [facility], [address] [place], [place] [address]    Report Status 07/02/2021 FINAL  Final  Urine Culture     Status: Abnormal   Collection Time: 06/27/21  2:39 AM   Specimen: In/Out Cath Urine  Result Value Ref Range Status   Specimen Description   Final  IN/OUT CATH URINE Performed at [facility], [address] [place], [place] [address]    Special Requests   Final    NONE Performed at [facility], [place], [place] [address]    Culture 30,000 COLONIES/mL YEAST (A)  Final   Report Status 06/28/2021 FINAL  Final  MRSA Next Gen by PCR, Nasal     Status: None   Collection Time: 06/30/21  5:01 AM   Specimen: Nasal Mucosa; Nasal Swab  Result Value Ref Range Status   MRSA by PCR Next Gen NOT DETECTED NOT DETECTED Final    Comment: (NOTE) The GeneXpert MRSA Assay (FDA approved for NASAL specimens only), is one component of a comprehensive MRSA colonization surveillance program. It is not intended to diagnose MRSA infection nor to guide or monitor treatment for MRSA infections. Test performance is not FDA approved in patients less than 16 years old. Performed at [facility], [address], [place], [place] [address]   Urine Culture     Status: Abnormal    Collection Time: 07/05/21 10:36 AM   Specimen: PATH Other; Urine  Result Value Ref Range Status   Specimen Description   Final    URINE, RANDOM LEFT RENAL PELVIS URINE Performed at [facility], [address] [place], [place] [address]    Special Requests   Final    NONE Performed at [facility], [address] [place], [place] [address]    Culture 70,000 COLONIES/mL CANDIDA GLABRATA (A)  Final   Report Status 07/12/2021 FINAL  Final  Urine Culture     Status: Abnormal (Preliminary result)   Collection Time: 07/05/21 10:48 AM   Specimen: PATH Other; Urine  Result Value Ref Range Status   Specimen Description   Final    URINE, RANDOM RIGHT KIDNEY URINE Performed at [facility], [address] [place], [place] [address]    Special Requests   Final    NONE Performed at [facility], [address] [place], [place] [address]    Culture (A)  Final    >=100,000 COLONIES/mL CANDIDA GLABRATA Sent to [place] for further susceptibility testing. Performed at [facility], [place] [address], [place], [place] [address]    Report Status PENDING  Incomplete  Antifungal AST 9 Drug Panel     Status: None (Preliminary result)   Collection Time: 07/05/21 10:48 AM  Result Value Ref Range Status   Organism ID, Yeast Preliminary report  Final    Comment: (NOTE) Specimen has been received and testing has been initiated. Performed At: [facility] [place], [place] [id number] Rush Farmer MD OM:[id number]    Amphotericin B MIC PENDING  Incomplete   Anidulafungin MIC PENDING  Incomplete   Caspofungin MIC PENDING  Incomplete   Micafungin MIC PENDING  Incomplete   Posaconazole MIC PENDING  Incomplete   Fluconazole Islt MIC PENDING  Incomplete   Flucytosine MIC PENDING  Incomplete   Itraconazole MIC PENDING  Incomplete   Voriconazole MIC PENDING  Incomplete   Source CANDIDA GLABRATA SUSCEPTIBILITY URINE CULTURE  Final     Comment: Performed at [facility], [place] [address], [place], [place] [address]  CULTURE, BLOOD (ROUTINE X 2) w Reflex to ID Panel     Status: None (Preliminary result)   Collection Time: 07/15/21  7:57 PM   Specimen: BLOOD  Result Value Ref Range Status   Specimen Description BLOOD LEFT ASSIST CONTROL  Final  Special Requests   Final    BOTTLES DRAWN AEROBIC AND ANAEROBIC Blood Culture adequate volume   Culture   Final    NO GROWTH < 12 HOURS Performed at [facility], [address] [place], [place] [address]    Report Status PENDING  Incomplete  CULTURE, BLOOD (ROUTINE X 2) w Reflex to ID Panel     Status: None (Preliminary result)   Collection Time: 07/15/21  8:07 PM   Specimen: BLOOD  Result Value Ref Range Status   Specimen Description BLOOD LEFT FOREARM  Final   Special Requests   Final    BOTTLES DRAWN AEROBIC AND ANAEROBIC Blood Culture adequate volume   Culture   Final    NO GROWTH < 12 HOURS Performed at [facility], [address] [place], [place] [address]    Report Status PENDING  Incomplete    Coagulation Studies: No results for input(s): LABPROT, INR in the last 72 hours.   Urinalysis: Recent Labs    07/15/21 1722  COLORURINE YELLOW  LABSPEC 1.020  PHURINE 6.0  GLUCOSEU NEGATIVE  HGBUR LARGE*  BILIRUBINUR NEGATIVE  KETONESUR NEGATIVE  PROTEINUR >300*  NITRITE NEGATIVE  LEUKOCYTESUR LARGE*       Imaging: CT ABDOMEN PELVIS WO CONTRAST  Result Date: 07/15/2021 CLINICAL DATA:  Abdominal abscess/infection suspected. EXAM: CT ABDOMEN AND PELVIS WITHOUT CONTRAST TECHNIQUE: Multidetector CT imaging of the abdomen and pelvis was performed following the standard protocol without IV contrast. COMPARISON:  CT abdomen pelvis dated 07/12/2021. FINDINGS: Evaluation of this exam is limited in the absence of intravenous contrast. Lower chest: Bibasilar patchy consolidative changes which may represent atelectasis or infiltrate.  Partially visualized tip of a central venous line or dialysis catheter in the right atrium close to the cavoatrial junction. No intra-abdominal free air.  No free fluid. Hepatobiliary: The liver is grossly unremarkable. No calcified gallstone pericholecystic fluid. Percutaneous subhepatic drainage catheter in similar position. No fluid collection identified adjacent to the catheter. Pancreas: Unremarkable. No pancreatic ductal dilatation or surrounding inflammatory changes. Spleen: Normal in size without focal abnormality. Adrenals/Urinary Tract: The adrenal glands are unremarkable. Nonobstructing bilateral renal calculi measure up to 11 mm in the inferior pole of the right kidney. Bilateral ureteral stents in similar position. There is mild bilateral hydronephrosis similar to prior CT. No significant interval change in the fluid along the anterior aspect of the left kidney. The urinary bladder is collapsed around a Foley catheter. Stomach/Bowel: A rectal tube is in place. An enteric tube with tip in the body of the stomach noted. There is sigmoid diverticulosis and scattered colonic diverticula without active inflammatory changes. There is no bowel obstruction. The appendix is normal. Vascular/Lymphatic: Mild aortoiliac atherosclerotic disease. The IVC is unremarkable. No portal venous gas. There is no adenopathy. Reproductive: The uterus is anteverted. Probable small uterine fibroid. No adnexal masses. Other: Percutaneous drainage catheter in the right hemipelvis in similar position. No fluid collection noted adjacent to the catheter. Musculoskeletal: Diffuse subcutaneous edema. No fluid collection. There is osteopenia with degenerative changes of the spine and hips. No acute osseous pathology. IMPRESSION: 1. No acute intra-abdominal or pelvic pathology. Stable positioning of two percutaneous drainage catheters. 2. Colonic diverticulosis. No bowel obstruction. Normal appendix. 3. Bilateral ureteral stents in  similar position. Mild bilateral hydronephrosis similar to prior CT. 4. Nonobstructing bilateral renal calculi. 5. Bibasilar patchy consolidative changes which may represent atelectasis or infiltrate. 6. Aortic Atherosclerosis (ICD10-I70.0). Electronically Signed   By: Anner Crete M.D.   On:  07/15/2021 22:06   DG Chest Port 1 View  Result Date: 07/15/2021 CLINICAL DATA:  Fever. EXAM: PORTABLE CHEST 1 VIEW COMPARISON:  07/13/2021 FINDINGS: Patient is new LEFT-sided IJ dialysis catheter, tip overlying the level of the UPPER RIGHT atrium. RIGHT IJ line has been removed. Endotracheal tube is absent. A nasogastric tube tip extends beyond the LOWER edge of the image, at least to the level of the gastroesophageal junction. Significant subsegmental atelectasis is again noted but similar to the prior study. Low lung volumes. There is no pneumothorax following new line placement. IMPRESSION: No pneumothorax following dialysis catheter. Bilateral subsegmental atelectasis similar to prior. Electronically Signed   By: Nolon Nations M.D.   On: 07/15/2021 17:21     Medications:    feeding supplement (PIVOT 1.5 CAL) 1,000 mL (07/16/21 1038)   fluconazole (DIFLUCAN) IV 400 mg (07/15/21 1956)   piperacillin-tazobactam (ZOSYN)  IV 3.375 g (07/16/21 0547)    amLODipine  10 mg Per Tube Daily   chlorhexidine  15 mL Mouth Rinse BID   chlorhexidine gluconate (MEDLINE KIT)  15 mL Mouth Rinse BID   Chlorhexidine Gluconate Cloth  6 each Topical Q0600   cloNIDine  0.2 mg Per Tube Daily   feeding supplement (PROSource TF)  45 mL Per Tube Daily   free water  100 mL Per Tube Q4H   heparin injection (subcutaneous)  5,000 Units Subcutaneous Q8H   insulin aspart  0-20 Units Subcutaneous Q4H   insulin aspart  12 Units Subcutaneous Q4H   insulin detemir  46 Units Subcutaneous BID   mouth rinse  15 mL Mouth Rinse q12n4p   metoprolol tartrate  25 mg Per Tube BID   multivitamin  1 tablet Per Tube QHS   nutrition  supplement (JUVEN)  1 packet Per Tube BID BM     Assessment/ Plan:  Heather Lopez is a 75 y.o. white female with diabetes mellitus type II, hypertension, hyperlipidemia, nephrolithiasis, depression, gout who is admitted to [facility] on 06/27/2021 for Hyperkalemia [E87.5] Hyponatremia [E87.1] ARF (acute renal failure) (HCC) [N17.9] Acute renal failure (ARF) (HCC) [N17.9] Acute respiratory failure with hypoxia ([place]) [J96.01] Demand ischemia of myocardium (HCC) [I24.8] Acute respiratory failure with hypoxemia (HCC) [J96.01] Acute renal failure superimposed on chronic kidney disease, unspecified CKD stage, unspecified acute renal failure type ([place]) [N17.9, N18.9] Sepsis, due to unspecified organism, unspecified whether acute organ dysfunction present [facility]) [A41.9]  Acute Kidney Injury on chronic kidney disease stage IV with baseline creatinine 1.97 and GFR of 25 on 12/22/19. Creatinine now at baseline. BUN trending up.  Acute kidney injury secondary to sepsis and obstructive uropathy Chronic kidney disease is secondary to diabetic nephropathy Continue to hold losartan and metformin Creatinine increasing with adequate UOP No indication for dialysis. Monitor daily dialysis need. Last dialysis was 8/19  Lab Results  Component Value Date   CREATININE 1.95 (H) 07/16/2021   CREATININE 1.97 (H) 07/15/2021   CREATININE 1.63 (H) 07/14/2021    Intake/Output Summary (Last 24 hours) at 07/16/2021 1316 Last data filed at 07/16/2021 1047 Gross per 24 hour  Intake 7.9 ml  Output 4275 ml  Net -4267.1 ml    2. Acute Respiratory failure secondary to community acquired pneumonia failing outpatient antibiotics. Febrile.  - Continue supplement oxygen.  - IV pip/tazo and fluconazole - new cultures sent  3. Anemia of chronic kidney disease Normocytic Lab Results  Component Value Date   HGB 8.9 (L) 07/16/2021   EPO with HD treatment on 8/18   4.  Diabetes mellitus type II with chronic kidney disease  insulin dependent. Most recent hemoglobin A1c is 8.3% on 06/12/21.  Metformin held    5.Bilateral Nephrolithiasis- with obstructive uropathy and left renal abscess Right - Mild to moderate chronic hydronephrosis without obstructing stone Left-mild left hydronephrosis with 14 mm stone at left UPJ- b/l ureteral stent placed 8/10 Lesion at upper pole of left kidney enlarged - likely hematoma or abscess  5. Hypertension with tachycardia: 150/58. Continue clonidine, amlodipine, and metoprolol. Will consider higher dose of metoprolol if needed.   6. Urinary tract infection: candida glabrata.  - fluconazole.      LOS: [place] 8/21/20221:16 PM

## 2021-07-16 NOTE — Progress Notes (Signed)
   07/15/21 2143  Assess: MEWS Score  Temp (!) 100.9 F (38.3 C)  BP (!) 156/65  Pulse Rate (!) 111  SpO2 92 %  O2 Device Nasal Cannula  O2 Flow Rate (L/min) 4 L/min  Assess: MEWS Score  MEWS Temp 1  MEWS Systolic 0  MEWS Pulse 2  MEWS RR 1  MEWS LOC 0  MEWS Score 4  MEWS Score Color Red  Assess: if the MEWS score is Yellow or Red  Were vital signs taken at a resting state? Yes  Focused Assessment Change from prior assessment (see assessment flowsheet)  Does the patient meet 2 or more of the SIRS criteria? Yes  Does the patient have a confirmed or suspected source of infection? Yes  Provider and Rapid Response Notified? Yes  MEWS guidelines implemented *See Row Information* Yes  Treat  MEWS Interventions Administered prn meds/treatments  Notify: Charge Nurse/RN  Name of Charge Nurse/RN Notified Dawn,RN  Notify: Provider  Provider Name/Title Carney Bern  Date Provider Notified 07/15/21  Time Provider Notified 2000  Notification Type Call  Notification Reason Other (Comment)  Provider response At bedside  Date of Provider Response 07/15/21  Time of Provider Response 2000  Document  Patient Outcome Not stable and remains on department  Progress note created (see row info) Yes  Assess: SIRS CRITERIA  SIRS Temperature  0  SIRS Pulse 1  SIRS Respirations  1  SIRS WBC 0  SIRS Score Sum  2

## 2021-07-16 NOTE — Progress Notes (Signed)
[facility] Day(s): [place] op day(s): 17 Days Post-Op.   Interval History:  Patient seen and examined Had tunneled HD catheter placed 07/13/21 Transferred to the floor yesterday afternoon. Was febrile in the evening and had a CT scan, but no intra-abdominal process was identified. Tube feedings 50 ccs/hr; at goal  She is having bowel function; multiple large loose stools daily; rectal tube placed  Vital signs in last 24 hours: [min-max] current  Temp:  [98.5 F (36.9 C)-102.8 F (39.3 C)] 99.7 F (37.6 C) (08/21 1205) Pulse Rate:  [105-124] 113 (08/21 0752) Resp:  [17-24] 17 (08/21 1205) BP: (150-174)/(58-78) 150/58 (08/21 1205) SpO2:  [91 %-100 %] 93 % (08/21 1205)     Height: 5' 2.01" (157.5 cm) Weight: 114 kg BMI (Calculated): 45.96   Intake/Output last 2 shifts:  08/20 0701 - 08/21 0700 In: 907.9 [NG/GT:700; IV Piggyback:207.9] Out: 9323 [Urine:3425; Drains:55; Stool:900]   Physical Exam:  Constitutional: opens eyes to voice, not following commands (nurse reports that she very recently gave her oxycodone and that she was following commands earlier) HEENT: NGT in place; receiving tube feedings Respiratory: normal WOB on nasal cannula O2. Cardiovascular: regular rate, sinus rhythm Gastrointestinal: Soft, unable to reliably assess tenderness, non-distended, surgical drains x2 in right lower abdomen, output serous in both  Genitourinary: Foley in place  Integumentary: Midline incision is well healed, tiny skin separation at the upper quarter of wound, no purulent drainage or discharge.  Labs:  CBC Latest Ref Rng & Units 07/16/2021 07/15/2021 07/14/2021  WBC 4.0 - 10.5 K/uL 6.4 5.2 5.0  Hemoglobin 12.0 - 15.0 g/dL 8.9(L) 8.5(L) 7.7(L)  Hematocrit 36.0 - 46.0 % 28.8(L) 26.6(L) 24.2(L)  Platelets 150 - 400 K/uL 328 267 186   CMP Latest Ref Rng & Units 07/16/2021 07/15/2021 07/14/2021  Glucose 70 - 99 mg/dL 149(H) 208(H) 139(H)   BUN 8 - 23 mg/dL 79(H) 67(H) 53(H)  Creatinine 0.44 - 1.00 mg/dL 1.95(H) 1.97(H) 1.63(H)  Sodium 135 - 145 mmol/L 149(H) 145 143  Potassium 3.5 - 5.1 mmol/L 3.8 4.0 3.4(L)  Chloride 98 - 111 mmol/L 106 104 104  CO2 22 - 32 mmol/L 28 28 29   Calcium 8.9 - 10.3 mg/dL 8.9 8.4(L) 8.1(L)  Total Protein 6.5 - 8.1 g/dL - 5.8(L) -  Total Bilirubin 0.3 - 1.2 mg/dL - 0.7 -  Alkaline Phos 38 - 126 U/L - 124 -  AST 15 - 41 U/L - 59(H) -  ALT 0 - 44 U/L - 139(H) -     Imaging studies: No new pertinent imaging studies   Assessment/Plan:  75 y.o.  female 13 days s/p exploratory laparotomy and primary repair of duodenal ulcer with omental patch   - Appreciate palliative care assistance  - Staples removed, okay to be open to air, can cover with dry gauze prn              - Continue tube feedings; at goal.  Would not initiate oral feeds till she is less somnolent.  - Okay to continue stool management device  - Appreciate urology assistance; follow up UCx (growing yeast); on Diflucan  - Monitor abdominal examination; on-going bowel function - Maintain surgical drains; monitor and record output    All of the above findings and recommendations were discussed with the medical team.

## 2021-07-16 NOTE — Progress Notes (Signed)
Heather Lopez notified patient will be transferred to ccu, verbalized understanding.

## 2021-07-16 NOTE — Progress Notes (Addendum)
Rapid response PROGRESS NOTE    Heather Lopez  HKF:[id number] DOB: February 20, 1946 DOA: 06/27/2021 PCP: [name], PA-C (  Rapid response called for red mews  Brief Narrative:  Patient with complicated recent past medical history including sepsis with septic shock secondary to renal abscess, perforated duodenal ulcer status post omental shock, respiratory failure requiring mechanical ventilation, acute kidney injury s/p ureteral stents and on dialysis who was transferred from the ICU to [place] on 8/19 after being successfully weaned off mechanical ventilation after several failed SBT. Patient is currently on tube feeds, off and on BiPAP, somewhat encephalopathic She is being actively followed by surgery, urology, ID, nephrology in addition to hospitalist service  On the night of 8/21 patient noted to be febrile and tachycardic but with good blood pressures.  Had a similar event on the night of 8/20, improved with Tylenol and cooling blanket(per my discussion with intensivist NP who responded to rapid response read mews on 8/20 while patient was still on the service of PCCM)  Past blood work reviewed and WBC has been WNL  Assessment & Plan:  Suspected sepsis without septic shock, suspected source GI or GU, possibly respiratory -Get CBC, BMP and lactic acid as well as KUB and chest x-ray - Continue Tylenol and cooling blanket for fever - Continue IV fluids - Continue current antibiotics  Epigastric pain in the setting of recent history of perforated duodenal ulcer -Patient appears to be tender in epigastrium, wincing when palpated - CBC, BMP and KUB ordered -- Discussed with Dr. Celine Ahr, surgery via secure chat.  Together decided that patient will be better served in stepdown.  She will monitor for lab test results and x-rays  CRITICAL CARE Performed by: Athena Masse   Total critical care time: 75 minutes  Critical care time was exclusive of separately billable procedures and  treating other patients.  Critical care was necessary to treat or prevent imminent or life-threatening deterioration.  Critical care was time spent personally by me on the following activities: development of treatment plan with patient and/or surrogate as well as nursing, discussions with consultants, evaluation of patient's response to treatment, examination of patient, obtaining history from patient or surrogate, ordering and performing treatments and interventions, ordering and review of laboratory studies, ordering and review of radiographic studies, pulse oximetry and re-evaluation of patient's condition.      Active Problems:   Uncontrolled type 2 diabetes mellitus with insulin therapy ([place])   Hyperlipidemia associated with type 2 diabetes mellitus ([place])   Type 2 diabetes mellitus with stage 4 chronic kidney disease, with long-term current use of insulin (HCC)   Acute respiratory failure with hypoxia (HCC)   Severe sepsis with septic shock (HCC)   Acute lower UTI   Acute kidney injury superimposed on chronic kidney disease (HCC)   Pneumoperitoneum   Pressure injury of skin   Atrial flutter with rapid ventricular response ([place])   Endotracheally intubated   Bilateral nephrolithiasis   Anemia of chronic disease   Demand ischemia of myocardium (HCC)   Lethargy   Duodenal ulcer perforation ([place])  Objective: Vitals:   07/16/21 1613 07/16/21 1935 07/16/21 2043 07/16/21 2134  BP: (!) 144/55 (!) 148/49 (!) 148/51 (!) 153/53  Pulse: (!) 109 (!) 117 (!) 118 (!) 117  Resp: 20 (!) 24 (!) 28 (!) 28  Temp: 99.5 F (37.5 C) (!) 102.1 F (38.9 C) (!) 103.2 F (39.6 C) (!) 103.2 F (39.6 C)  TempSrc: Oral Oral Oral Oral  SpO2: 94% 94% 95% 95%  Weight:      Height:        Intake/Output Summary (Last 24 hours) at 07/16/2021 2214 Last data filed at 07/16/2021 1747 Gross per 24 hour  Intake --  Output 4255 ml  Net -4255 ml   Filed Weights   07/12/21 0436 07/13/21 0500 07/14/21 0500   Weight: 107.1 kg 112 kg 114 kg    Examination:  General exam: Appears acutely ill, tachypneic, dehydrated Respiratory system: Diminished bilaterally cardiovascular system: Tachycardic  gastrointestinal system: Abdomen is nondistended, soft appears tender in epigastrium. No organomegaly or masses felt. Normal bowel sounds heard. Central nervous system: Lethargic.  Maintaining eye contact but not responding to questions.  Will occasionally wince  Data Reviewed: I have personally reviewed following labs and imaging studies  CBC: Recent Labs  Lab 07/12/21 0501 07/13/21 0435 07/14/21 0429 07/15/21 1036 07/16/21 0444  WBC 7.5 5.6 5.0 5.2 6.4  HGB 8.1* 7.5* 7.7* 8.5* 8.9*  HCT 24.7* 23.2* 24.2* 26.6* 28.8*  MCV 84.6 86.9 86.4 87.5 88.6  PLT 162 180 186 267 053   Basic Metabolic Panel: Recent Labs  Lab 07/11/21 1510 07/12/21 0501 07/13/21 0435 07/14/21 0429 07/15/21 1036 07/16/21 0444  NA 140 142 146* 143 145 149*  K 3.7 3.4* 3.5 3.4* 4.0 3.8  CL 106 107 113* 104 104 106  CO2 _0 GLUCOSE 197* 185* 235* 139* 208* 149*  BUN 81* 84* 106* 53* 67* 79*  CREATININE 2.17* 2.17* 2.45* 1.63* 1.97* 1.95*  CALCIUM 8.3* 8.4* 8.3* 8.1* 8.4* 8.9  MG 1.7 1.7 2.0  --  1.9 2.0  PHOS 3.2 2.7 3.1  --  3.1 3.2   GFR: Estimated Creatinine Clearance: 30.2 mL/min (A) (by C-G formula based on SCr of 1.95 mg/dL (H)). Liver Function Tests: Recent Labs  Lab 07/13/21 0500 07/15/21 1036  AST 52* 59*  ALT 114* 139*  ALKPHOS 107 124  BILITOT 0.5 0.7  PROT 5.3* 5.8*  ALBUMIN 1.7* 1.7*   No results for input(s): LIPASE, AMYLASE in the last 168 hours. No results for input(s): AMMONIA in the last 168 hours. Coagulation Profile: No results for input(s): INR, PROTIME in the last 168 hours. Cardiac Enzymes: No results for input(s): CKTOTAL, CKMB, CKMBINDEX, TROPONINI in the last 168 hours. BNP (last 3 results) No results for input(s): PROBNP in the last 8760 hours. HbA1C: No  results for input(s): HGBA1C in the last 72 hours. CBG: Recent Labs  Lab 07/16/21 0355 07/16/21 0745 07/16/21 1202 07/16/21 1555 07/16/21 1937  GLUCAP 147* 125* 93 140* 145*   Lipid Profile: No results for input(s): CHOL, HDL, LDLCALC, TRIG, CHOLHDL, LDLDIRECT in the last 72 hours. Thyroid Function Tests: No results for input(s): TSH, T4TOTAL, FREET4, T3FREE, THYROIDAB in the last 72 hours. Anemia Panel: No results for input(s): VITAMINB12, FOLATE, FERRITIN, TIBC, IRON, RETICCTPCT in the last 72 hours. Sepsis Labs: No results for input(s): PROCALCITON, LATICACIDVEN in the last 168 hours.  Recent Results (from the past 240 hour(s))  CULTURE, BLOOD (ROUTINE X 2) w Reflex to ID Panel     Status: None (Preliminary result)   Collection Time: 07/15/21  7:57 PM   Specimen: BLOOD  Result Value Ref Range Status   Specimen Description BLOOD LEFT ASSIST CONTROL  Final   Special Requests   Final    BOTTLES DRAWN AEROBIC AND ANAEROBIC Blood Culture adequate volume   Culture   Final    NO GROWTH < 12  HOURS Performed at [facility], [address] [place], [place] [address]    Report Status PENDING  Incomplete  CULTURE, BLOOD (ROUTINE X 2) w Reflex to ID Panel     Status: None (Preliminary result)   Collection Time: 07/15/21  8:07 PM   Specimen: BLOOD  Result Value Ref Range Status   Specimen Description BLOOD LEFT FOREARM  Final   Special Requests   Final    BOTTLES DRAWN AEROBIC AND ANAEROBIC Blood Culture adequate volume   Culture   Final    NO GROWTH < 12 HOURS Performed at [facility], [address] [place], [place] [address]    Report Status PENDING  Incomplete         Radiology Studies: CT ABDOMEN PELVIS WO CONTRAST  Result Date: 07/15/2021 CLINICAL DATA:  Abdominal abscess/infection suspected. EXAM: CT ABDOMEN AND PELVIS WITHOUT CONTRAST TECHNIQUE: Multidetector CT imaging of the abdomen and pelvis was performed following the standard protocol  without IV contrast. COMPARISON:  CT abdomen pelvis dated 07/12/2021. FINDINGS: Evaluation of this exam is limited in the absence of intravenous contrast. Lower chest: Bibasilar patchy consolidative changes which may represent atelectasis or infiltrate. Partially visualized tip of a central venous line or dialysis catheter in the right atrium close to the cavoatrial junction. No intra-abdominal free air.  No free fluid. Hepatobiliary: The liver is grossly unremarkable. No calcified gallstone pericholecystic fluid. Percutaneous subhepatic drainage catheter in similar position. No fluid collection identified adjacent to the catheter. Pancreas: Unremarkable. No pancreatic ductal dilatation or surrounding inflammatory changes. Spleen: Normal in size without focal abnormality. Adrenals/Urinary Tract: The adrenal glands are unremarkable. Nonobstructing bilateral renal calculi measure up to 11 mm in the inferior pole of the right kidney. Bilateral ureteral stents in similar position. There is mild bilateral hydronephrosis similar to prior CT. No significant interval change in the fluid along the anterior aspect of the left kidney. The urinary bladder is collapsed around a Foley catheter. Stomach/Bowel: A rectal tube is in place. An enteric tube with tip in the body of the stomach noted. There is sigmoid diverticulosis and scattered colonic diverticula without active inflammatory changes. There is no bowel obstruction. The appendix is normal. Vascular/Lymphatic: Mild aortoiliac atherosclerotic disease. The IVC is unremarkable. No portal venous gas. There is no adenopathy. Reproductive: The uterus is anteverted. Probable small uterine fibroid. No adnexal masses. Other: Percutaneous drainage catheter in the right hemipelvis in similar position. No fluid collection noted adjacent to the catheter. Musculoskeletal: Diffuse subcutaneous edema. No fluid collection. There is osteopenia with degenerative changes of the spine and  hips. No acute osseous pathology. IMPRESSION: 1. No acute intra-abdominal or pelvic pathology. Stable positioning of two percutaneous drainage catheters. 2. Colonic diverticulosis. No bowel obstruction. Normal appendix. 3. Bilateral ureteral stents in similar position. Mild bilateral hydronephrosis similar to prior CT. 4. Nonobstructing bilateral renal calculi. 5. Bibasilar patchy consolidative changes which may represent atelectasis or infiltrate. 6. Aortic Atherosclerosis (ICD10-I70.0). Electronically Signed   By: Anner Crete M.D.   On: 07/15/2021 22:06   DG Chest Port 1 View  Result Date: 07/15/2021 CLINICAL DATA:  Fever. EXAM: PORTABLE CHEST 1 VIEW COMPARISON:  07/13/2021 FINDINGS: Patient is new LEFT-sided IJ dialysis catheter, tip overlying the level of the UPPER RIGHT atrium. RIGHT IJ line has been removed. Endotracheal tube is absent. A nasogastric tube tip extends beyond the LOWER edge of the image, at least to the level of the gastroesophageal junction. Significant subsegmental atelectasis is again noted but similar to the  prior study. Low lung volumes. There is no pneumothorax following new line placement. IMPRESSION: No pneumothorax following dialysis catheter. Bilateral subsegmental atelectasis similar to prior. Electronically Signed   By: Nolon Nations M.D.   On: 07/15/2021 17:21        Scheduled Meds:  amLODipine  10 mg Per Tube Daily   chlorhexidine  15 mL Mouth Rinse BID   chlorhexidine gluconate (MEDLINE KIT)  15 mL Mouth Rinse BID   Chlorhexidine Gluconate Cloth  6 each Topical Q0600   cloNIDine  0.2 mg Per Tube Daily   feeding supplement (PROSource TF)  45 mL Per Tube Daily   free water  100 mL Per Tube Q4H   heparin injection (subcutaneous)  5,000 Units Subcutaneous Q8H   insulin aspart  0-20 Units Subcutaneous Q4H   insulin aspart  8 Units Subcutaneous Q4H   insulin detemir  35 Units Subcutaneous BID   mouth rinse  15 mL Mouth Rinse q12n4p   metoprolol tartrate   25 mg Per Tube BID   multivitamin  1 tablet Per Tube QHS   nutrition supplement (JUVEN)  1 packet Per Tube BID BM   Continuous Infusions:  feeding supplement (PIVOT 1.5 CAL) 1,000 mL (07/16/21 1038)   fluconazole (DIFLUCAN) IV 400 mg (07/16/21 2057)   piperacillin-tazobactam (ZOSYN)  IV 3.375 g (07/16/21 1329)     LOS: 19 days    Time spent: [place], MD [facility]

## 2021-07-16 NOTE — Progress Notes (Signed)
   07/16/21 0223  Vitals  Temp 99.2 F (37.3 C)  Temp Source Oral  BP (!) 174/67  MAP (mmHg) 97  BP Location Left Arm  BP Method Automatic  Patient Position (if appropriate) Lying  Pulse Rate (!) 110  Pulse Rate Source Monitor  Resp 19  Level of Consciousness  Level of Consciousness Alert  MEWS COLOR  MEWS Score Color Green  Oxygen Therapy  SpO2 98 %  O2 Device Bi-PAP  MEWS Score  MEWS Temp 0  MEWS Systolic 0  MEWS Pulse 1  MEWS RR 0  MEWS LOC 0  MEWS Score 1   Patient is more stable at this time. She is sleeping and easily aroused to voice. Will continue to monitor

## 2021-07-16 NOTE — Progress Notes (Signed)
   07/16/21 1935  Assess: MEWS Score  Temp (!) 102.1 F (38.9 C)  BP (!) 148/49  Pulse Rate (!) 117  Resp (!) 24  SpO2 94 %  O2 Device Nasal Cannula  O2 Flow Rate (L/min) 3 L/min  Assess: MEWS Score  MEWS Temp 2  MEWS Systolic 0  MEWS Pulse 2  MEWS RR 1  MEWS LOC 0  MEWS Score 5  MEWS Score Color Red  Assess: if the MEWS score is Yellow or Red  Were vital signs taken at a resting state? Yes  Focused Assessment Change from prior assessment (see assessment flowsheet)  Does the patient meet 2 or more of the SIRS criteria? Yes  Does the patient have a confirmed or suspected source of infection? No  Provider and Rapid Response Notified? Yes  MEWS guidelines implemented *See Row Information* Yes  Treat  MEWS Interventions Administered scheduled meds/treatments;Escalated (See documentation below)  Pain Scale PAINAD  Breathing 1  Negative Vocalization 0  Facial Expression 2  Body Language 1  Consolability 1  PAINAD Score 5  Take Vital Signs  Increase Vital Sign Frequency  Red: Q 1hr X 4 then Q 4hr X 4, if remains red, continue Q 4hrs  Escalate  MEWS: Escalate Red: discuss with charge nurse/RN and provider, consider discussing with RRT  Notify: Charge Nurse/RN  Name of Charge Nurse/RN Notified TC RN  Date Charge Nurse/RN Notified 07/16/21  Time Charge Nurse/RN Notified 1940  Notify: Provider  Provider Name/Title Dr Damita Dunnings  Date Provider Notified 07/16/21  Time Provider Notified 1940  Notification Type Call  Notification Reason Change in status  Provider response At bedside  Date of Provider Response 07/16/21  Time of Provider Response 1950  Notify: Rapid Response  Date Rapid Response Notified 07/16/21  Time Rapid Response Notified 1940  Document  Patient Outcome Not stable and remains on department  Progress note created (see row info) Yes  Assess: SIRS CRITERIA  SIRS Temperature  1  SIRS Pulse 1  SIRS Respirations  1  SIRS WBC 0  SIRS Score Sum  3

## 2021-07-16 NOTE — Progress Notes (Signed)
Pt has returned from CT scan. Pt remain febrile. Olga Millers, NP is at the bedside and recommends that ice pkg should be applied to assist with temp. Cooling blanket is turned off. 4 ice pkgs are placed on patient's bilateral wrist and feet.

## 2021-07-16 NOTE — Progress Notes (Signed)
Patient is transferred to radiology depart with nurse at the bedside.

## 2021-07-16 NOTE — Progress Notes (Signed)
Patient ID: Heather Lopez, female   DOB: Jan 26, 1946, 75 y.o.   MRN: [id number] [facility] PROGRESS NOTE  Heather Lopez MEQ:[id number] DOB: [birth year] DOA: 06/27/2021 PCP: [name], PA-C  HPI/Subjective: Patient seen this morning and was on BiPAP with tube feedings.  Patient was able to follow some simple commands like wiggling toes and squeezing my fingers.  Not able to talk too much.  Patient was able to come off the BiPAP and on 4 L nasal cannula.  Initially admitted on 06/27/2021 with acute kidney injury, acute respiratory failure and lactic acidosis.  Objective: Vitals:   07/16/21 0904 07/16/21 1205  BP:  (!) 150/58  Pulse:    Resp:  17  Temp:  99.7 F (37.6 C)  SpO2: 95% 93%    Intake/Output Summary (Last 24 hours) at 07/16/2021 1331 Last data filed at 07/16/2021 1319 Gross per 24 hour  Intake 7.9 ml  Output 4290 ml  Net -4282.1 ml   Filed Weights   07/12/21 0436 07/13/21 0500 07/14/21 0500  Weight: 107.1 kg 112 kg 114 kg    ROS: Review of Systems  Unable to perform ROS: Acuity of condition  Exam: Physical Exam HENT:     Head: Normocephalic.     Mouth/Throat:     Pharynx: No oropharyngeal exudate.  Eyes:     General: Lids are normal.     Conjunctiva/sclera: Conjunctivae normal.  Cardiovascular:     Rate and Rhythm: Regular rhythm. Tachycardia present.     Heart sounds: Normal heart sounds, S1 normal and S2 normal.  Pulmonary:     Breath sounds: Examination of the right-lower field reveals decreased breath sounds. Examination of the left-lower field reveals decreased breath sounds. Decreased breath sounds present. No wheezing, rhonchi or rales.  Abdominal:     Palpations: Abdomen is soft.     Tenderness: There is no abdominal tenderness.  Musculoskeletal:     Right lower leg: Swelling present.     Left lower leg: Swelling present.  Skin:    General: Skin is warm.     Findings: No rash.  Neurological:     Mental Status: She is lethargic.      Scheduled  Meds:  amLODipine  10 mg Per Tube Daily   chlorhexidine  15 mL Mouth Rinse BID   chlorhexidine gluconate (MEDLINE KIT)  15 mL Mouth Rinse BID   Chlorhexidine Gluconate Cloth  6 each Topical Q0600   cloNIDine  0.2 mg Per Tube Daily   feeding supplement (PROSource TF)  45 mL Per Tube Daily   free water  100 mL Per Tube Q4H   heparin injection (subcutaneous)  5,000 Units Subcutaneous Q8H   insulin aspart  0-20 Units Subcutaneous Q4H   insulin aspart  12 Units Subcutaneous Q4H   insulin detemir  46 Units Subcutaneous BID   mouth rinse  15 mL Mouth Rinse q12n4p   metoprolol tartrate  25 mg Per Tube BID   multivitamin  1 tablet Per Tube QHS   nutrition supplement (JUVEN)  1 packet Per Tube BID BM   Continuous Infusions:  feeding supplement (PIVOT 1.5 CAL) 1,000 mL (07/16/21 1038)   fluconazole (DIFLUCAN) IV 400 mg (07/15/21 1956)   piperacillin-tazobactam (ZOSYN)  IV 3.375 g (07/16/21 1329)   Brief history.  Patient admitted on 06/27/2021 with weakness and found to have acute renal failure on chronic kidney disease stage III with hyperkalemia, acute respiratory failure with hypoxia on BiPAP, lactic acidosis and sepsis.  Past medical history  of type 2 diabetes mellitus, hyperlipidemia, hypertension and depression and was scheduled to have hip surgery on the 24th. Hospital course.   8/2: Admitted to [facility] unit with sepsis 8/3: Vascular consulted for HD catheter 8/4: Patient developed acute abdomen.  CT abdomen shows viscus perforation.  Patient taken to the OR urgently for exploratory lap.  Patient return to ICU on vent.  PCCM consulted 07/01/21- patient had SBT today for recruitment and chest physiotherapy.  Still has significant tachycardia and hypertension when awake (possible pain related) but mentation is good able to follow verbal communication.  07/02/21-patient has failed SBT today due to hypoxemia.  Reviewed care plan with surgery team.  07/03/21- Lung V/Q scan obtained ~ negative for PE.   Weaning FiO2, currently at 50% 8/9 severe resp failure, remains on vent 8/10 s/p Cystoscopy with Bilateral ureteral stent placement (55F/24 cm) for LEFT OBSTRUCTED KIDNEY STONE 8/11 remains on VENT 8/11 failed SAT/SBT  8/12 started on insulin drip for FSBS >400, developed A. fib with RVR CT abdomen pelvis showed decreasing size of left suprarenal collection 8/13 transitioned off insulin, rate relatively controlled 8/14 issues with increased heart rate when Cardizem decreased.  Currently at 10 mg/h plus Lopressor IV every 6H, consider transitioning Cardizem to via OG in a.m. 8/15 failing weaning trials 8/16 failed weaning trials discussed with family-likely needs [facility] 8/19 extubated to BiPAP  Assessment/Plan:  Lethargy.  Discontinue standing dose pain medications and make as needed.  ABG. Acute kidney injury on chronic kidney disease stage IV requiring dialysis during the hospital course.  Creatinine 9.44 at its peak and down to 1.95 today.  Nephrology following Sepsis with septic shock, present on admission secondary to obstructive uropathy, kidney stone, renal abscess.  Patient stent placements on 07/05/2021 by urology.  Candida growing out of urine culture.  Currently on Diflucan and Zosyn as per ID Acute hypoxic respiratory failure extubated on 07/14/2021.  We will hold off on BiPAP because I do not like that with tube feeding.  Continue nasal cannula if possible.  We will get an ABG to make sure she is not retaining carbon dioxide. Essential hypertension and tachycardia on clonidine, amlodipine and metoprolol Duodenal ulcer repair with patch by general surgery Acute atrial flutter with rapid ventricular response.  On metoprolol Type 2 diabetes mellitus.  On insulin while on tube feeding. Nutrition on tube feeding. Stage II decubiti, present on admission see full description below  Pressure Injury 07/07/21 Buttocks Stage 2 -  Partial thickness loss of dermis presenting as a shallow open  injury with a red, pink wound bed without slough. (Active)  07/07/21 0800  Location: Buttocks  Location Orientation:   Staging: Stage 2 -  Partial thickness loss of dermis presenting as a shallow open injury with a red, pink wound bed without slough.  Wound Description (Comments):   Present on Admission: No       Code Status:     Code Status Orders  (From admission, onward)           Start     Ordered   07/12/21 1523  Do not attempt resuscitation (DNR)  Continuous       Question Answer Comment  In the event of cardiac or respiratory ARREST Do not call a "code blue"   In the event of cardiac or respiratory ARREST Do not perform Intubation, CPR, defibrillation or ACLS   In the event of cardiac or respiratory ARREST Use medication by any route, position, wound care, and other measures  to relive pain and suffering. May use oxygen, suction and manual treatment of airway obstruction as needed for comfort.      07/12/21 1523           Code Status History     Date Active Date Inactive Code Status Order ID Comments User Context   06/27/2021 0445 07/12/2021 1523 Full Code [id number]  Rise Patience, MD ED      Advance Directive Documentation    Flowsheet Row Most Recent Value  Type of Advance Directive Healthcare Power of Attorney  Pre-existing out of facility DNR order (yellow form or pink MOST form) --  "MOST" Form in Place? --      Family Communication: Spoke with husband on the phone Disposition Plan: Status is: Inpatient  Dispo: The patient is from: Home              Anticipated d/c is to: Likely will need rehab              Patient currently lethargic and not ready for disposition   Difficult to place patient.  No.  Consultants: General surgery, nephrology, infectious disease, urology  Procedures: Ureteral stents Duodenal ulcer rupture repair  Antibiotics: Zosyn and Diflucan  Time spent: 73 minutes  [place]

## 2021-07-17 ENCOUNTER — Inpatient Hospital Stay: Payer: Medicare Other

## 2021-07-17 ENCOUNTER — Encounter: Payer: Self-pay | Admitting: Vascular Surgery

## 2021-07-17 DIAGNOSIS — N151 Renal and perinephric abscess: Secondary | ICD-10-CM

## 2021-07-17 DIAGNOSIS — I10 Essential (primary) hypertension: Secondary | ICD-10-CM

## 2021-07-17 DIAGNOSIS — N170 Acute kidney failure with tubular necrosis: Secondary | ICD-10-CM | POA: Diagnosis not present

## 2021-07-17 DIAGNOSIS — N179 Acute kidney failure, unspecified: Secondary | ICD-10-CM | POA: Diagnosis not present

## 2021-07-17 DIAGNOSIS — N189 Chronic kidney disease, unspecified: Secondary | ICD-10-CM | POA: Diagnosis not present

## 2021-07-17 DIAGNOSIS — N390 Urinary tract infection, site not specified: Secondary | ICD-10-CM | POA: Diagnosis not present

## 2021-07-17 DIAGNOSIS — N200 Calculus of kidney: Secondary | ICD-10-CM | POA: Diagnosis not present

## 2021-07-17 DIAGNOSIS — J9601 Acute respiratory failure with hypoxia: Secondary | ICD-10-CM | POA: Diagnosis not present

## 2021-07-17 LAB — CBC
HCT: 26.7 % — ABNORMAL LOW (ref 36.0–46.0)
Hemoglobin: 8.1 g/dL — ABNORMAL LOW (ref 12.0–15.0)
MCH: 26.6 pg (ref 26.0–34.0)
MCHC: 30.3 g/dL (ref 30.0–36.0)
MCV: 87.5 fL (ref 80.0–100.0)
Platelets: 346 10*3/uL (ref 150–400)
RBC: 3.05 MIL/uL — ABNORMAL LOW (ref 3.87–5.11)
RDW: 16.4 % — ABNORMAL HIGH (ref 11.5–15.5)
WBC: 7.1 10*3/uL (ref 4.0–10.5)
nRBC: 0 % (ref 0.0–0.2)

## 2021-07-17 LAB — HEPATIC FUNCTION PANEL
ALT: 88 U/L — ABNORMAL HIGH (ref 0–44)
AST: 52 U/L — ABNORMAL HIGH (ref 15–41)
Albumin: 1.8 g/dL — ABNORMAL LOW (ref 3.5–5.0)
Alkaline Phosphatase: 107 U/L (ref 38–126)
Bilirubin, Direct: <0.1 mg/dL (ref 0.0–0.2)
Total Bilirubin: 0.5 mg/dL (ref 0.3–1.2)
Total Protein: 6 g/dL — ABNORMAL LOW (ref 6.5–8.1)

## 2021-07-17 LAB — BASIC METABOLIC PANEL
Anion gap: 7 (ref 5–15)
BUN: 83 mg/dL — ABNORMAL HIGH (ref 8–23)
CO2: 31 mmol/L (ref 22–32)
Calcium: 8.8 mg/dL — ABNORMAL LOW (ref 8.9–10.3)
Chloride: 111 mmol/L (ref 98–111)
Creatinine, Ser: 2.02 mg/dL — ABNORMAL HIGH (ref 0.44–1.00)
GFR, Estimated: 25 mL/min — ABNORMAL LOW (ref >60)
Glucose, Bld: 109 mg/dL — ABNORMAL HIGH (ref 70–99)
Potassium: 3.6 mmol/L (ref 3.5–5.1)
Sodium: 149 mmol/L — ABNORMAL HIGH (ref 135–145)

## 2021-07-17 LAB — URINE CULTURE: Culture: >=100000 — AB

## 2021-07-17 LAB — LACTIC ACID, PLASMA: Lactic Acid, Venous: 1.7 mmol/L (ref 0.5–1.9)

## 2021-07-17 LAB — PROCALCITONIN: Procalcitonin: 1.54 ng/mL

## 2021-07-17 LAB — MAGNESIUM: Magnesium: 2 mg/dL (ref 1.7–2.4)

## 2021-07-17 LAB — GLUCOSE, CAPILLARY
Glucose-Capillary: 90 mg/dL (ref 70–99)
Glucose-Capillary: 135 mg/dL — ABNORMAL HIGH (ref 70–99)
Glucose-Capillary: 176 mg/dL — ABNORMAL HIGH (ref 70–99)
Glucose-Capillary: 270 mg/dL — ABNORMAL HIGH (ref 70–99)
Glucose-Capillary: 248 mg/dL — ABNORMAL HIGH (ref 70–99)

## 2021-07-17 LAB — PHOSPHORUS: Phosphorus: 2.8 mg/dL (ref 2.5–4.6)

## 2021-07-17 MED ORDER — FLUCONAZOLE IN SODIUM CHLORIDE 400-0.9 MG/200ML-% IV SOLN
800.0000 mg | INTRAVENOUS | Status: DC
  Filled 2021-07-17: qty 400

## 2021-07-17 MED ORDER — METOPROLOL TARTRATE 50 MG PO TABS
50.0000 mg | ORAL_TABLET | Freq: Two times a day (BID) | ORAL | Status: DC
  Administered 2021-07-17 – 2021-07-20 (×6): 50 mg
  Filled 2021-07-17 (×6): qty 1

## 2021-07-17 MED ORDER — INSULIN DETEMIR 100 UNIT/ML ~~LOC~~ SOLN
20.0000 [IU] | Freq: Two times a day (BID) | SUBCUTANEOUS | Status: DC
  Administered 2021-07-17 – 2021-07-19 (×5): 20 [IU] via SUBCUTANEOUS
  Filled 2021-07-17 (×6): qty 0.2

## 2021-07-17 MED ORDER — FLUCONAZOLE IN SODIUM CHLORIDE 400-0.9 MG/200ML-% IV SOLN
400.0000 mg | INTRAVENOUS | Status: DC
  Administered 2021-07-17 – 2021-07-18 (×2): 400 mg via INTRAVENOUS
  Filled 2021-07-17 (×2): qty 200

## 2021-07-17 MED ORDER — PROSOURCE TF PO LIQD
45.0000 mL | Freq: Two times a day (BID) | ORAL | Status: DC
  Administered 2021-07-17 – 2021-07-24 (×14): 45 mL
  Filled 2021-07-17 (×15): qty 45

## 2021-07-17 MED ORDER — NEPRO/CARBSTEADY PO LIQD
1000.0000 mL | ORAL | Status: DC
Start: 2021-07-17 — End: 2021-07-24
  Administered 2021-07-17: 1000 mL

## 2021-07-17 MED ORDER — MORPHINE SULFATE (PF) 2 MG/ML IV SOLN
1.0000 mg | INTRAVENOUS | Status: AC
  Administered 2021-07-17: 1 mg via INTRAVENOUS
  Filled 2021-07-17: qty 1

## 2021-07-17 MED ORDER — FREE WATER
200.0000 mL | Status: DC
  Administered 2021-07-17 – 2021-07-24 (×40): 200 mL

## 2021-07-17 MED ORDER — INSULIN ASPART 100 UNIT/ML IJ SOLN
4.0000 [IU] | INTRAMUSCULAR | Status: DC
  Administered 2021-07-17 (×4): 4 [IU] via SUBCUTANEOUS
  Filled 2021-07-17: qty 1

## 2021-07-17 MED ORDER — OXYCODONE HCL 5 MG/5ML PO SOLN
5.0000 mg | Freq: Four times a day (QID) | ORAL | Status: DC | PRN
Start: 2021-07-17 — End: 2021-08-03
  Administered 2021-07-22 – 2021-07-30 (×6): 5 mg
  Filled 2021-07-17 (×6): qty 5

## 2021-07-17 MED ORDER — DEXTROSE 5 % IV SOLN: INTRAVENOUS | Status: DC

## 2021-07-17 NOTE — Progress Notes (Signed)
[facility]  ROUNDING NOTE   Subjective:   Heather Lopez is a 75 y.o. white female who has been admitted for community acquired pneumonia.    Hospital course complicated by duodenal perforation s/p repair on 06/30/2021 ARF - requiring HD as needed. Last hemodialysis was 8/18.  Acute resp failure requiring mech ventilation Left obstructive hydronephrosis and renal abscess, Lopez underwent cystoscopy and b/l ureteral stent placement 8/10  UOP 3077m (34259m  Tmax 103.2  Moved to ICU overnight.    Objective:  Vital signs in last 24 hours:  Temp:  [98.8 F (37.1 C)-103.2 F (39.6 C)] 98.9 F (37.2 C) (08/22 0800) Pulse Rate:  [104-118] 112 (08/22 0800) Resp:  [17-31] 31 (08/22 0800) BP: (144-174)/(49-76) 166/58 (08/22 0800) SpO2:  [90 %-95 %] 94 % (08/22 0800) Weight:  [125.5 kg] 125.5 kg (08/22 0500)  Weight change:  Filed Weights   07/13/21 0500 07/14/21 0500 07/17/21 0500  Weight: 112 kg 114 kg 125.5 kg    Intake/Output: I/O last 3 completed shifts: In: 887.5 [NG/GT:275; IV Piggyback:612.5] Out: 5900 [Urine:5050; Drains:50; Stool:800]   Intake/Output this shift:  Total I/O In: 12.5 [IV Piggyback:12.5] Out: -   Physical Exam: General: Critically ill  Head: [place]/AT  Eyes: Anicteric  Lungs:  +coarse breath sounds, 4L [place]  Heart: tachycardia  Abdomen:  +JP drains  Extremities:  trace peripheral edema   Neurologic:  Not following commands, tracks movements with eyes.   Skin: No lesions  GU Foley with yellow urine  Access:  RIJ temp cath, LIJ permcath not functioning.     Basic Metabolic Panel: Recent Labs  Lab 07/12/21 0501 07/13/21 0435 07/14/21 0429 07/15/21 1036 07/16/21 0444 07/16/21 2217 07/17/21 0434  NA 142 146* 143 145 149* 147* 149*  K 3.4* 3.5 3.4* 4.0 3.8 3.7 3.6  CL 107 113* 104 104 106 108 111  CO2 _0 GLUCOSE 185* 235* 139* 208* 149* 124* 109*  BUN 84* 106* 53* 67* 79* 86* 83*  CREATININE 2.17* 2.45* 1.63*  1.97* 1.95* 2.08* 2.02*  CALCIUM 8.4* 8.3* 8.1* 8.4* 8.9 8.7* 8.8*  MG 1.7 2.0  --  1.9 2.0  --  2.0  PHOS 2.7 3.1  --  3.1 3.2  --  2.8     Liver Function Tests: Recent Labs  Lab 07/13/21 0500 07/15/21 1036 07/16/21 2217 07/17/21 0434  AST 52* 59* 50* 52*  ALT 114* 139* 92* 88*  ALKPHOS 107 124 108 107  BILITOT 0.5 0.7 0.4 0.5  PROT 5.3* 5.8* 5.8* 6.0*  ALBUMIN 1.7* 1.7* 1.7* 1.8*    No results for input(s): LIPASE, AMYLASE in the last 168 hours. No results for input(s): AMMONIA in the last 168 hours.  CBC: Recent Labs  Lab 07/14/21 0429 07/15/21 1036 07/16/21 0444 07/16/21 2217 07/17/21 0434  WBC 5.0 5.2 6.4 6.3 7.1  NEUTROABS  --   --   --  4.5  --   HGB 7.7* 8.5* 8.9* 7.9* 8.1*  HCT 24.2* 26.6* 28.8* 25.4* 26.7*  MCV 86.4 87.5 88.6 89.1 87.5  PLT 186 267 328 328 346     Cardiac Enzymes: No results for input(s): CKTOTAL, CKMB, CKMBINDEX, TROPONINI in the last 168 hours.  BNP: Invalid input(s): POCBNP  CBG: Recent Labs  Lab 07/16/21 1555 07/16/21 1937 07/16/21 2354 07/17/21 0334 07/17/21 0743  GLUCAP 140* 145* 97 90 135*     Microbiology: Results for orders placed or performed during the hospital encounter  of 06/27/21  Resp Panel by RT-PCR (Flu A&B, Covid) Nasopharyngeal Swab     Status: None   Collection Time: 06/27/21 12:32 AM   Specimen: Nasopharyngeal Swab; Nasopharyngeal(NP) swabs in vial transport medium  Result Value Ref Range Status   SARS Coronavirus 2 by RT PCR NEGATIVE NEGATIVE Final    Comment: (NOTE) SARS-CoV-2 target nucleic acids are NOT DETECTED.  The SARS-CoV-2 RNA is generally detectable in upper respiratory specimens during the acute phase of infection. The lowest concentration of SARS-CoV-2 viral copies this assay can detect is 138 copies/mL. A negative result does not preclude SARS-Cov-2 infection and should not be used as the sole basis for treatment or other Lopez management decisions. A negative result may occur  with  improper specimen collection/handling, submission of specimen other than nasopharyngeal swab, presence of viral mutation(s) within the areas targeted by this assay, and inadequate number of viral copies(<138 copies/mL). A negative result must be combined with clinical observations, Lopez history, and epidemiological information. The expected result is Negative.  Fact Sheet for Patients:  EntrepreneurPulse.com.au  Fact Sheet for Healthcare Providers:  IncredibleEmployment.be  This test is no t yet approved or cleared by the [place] FDA and  has been authorized for detection and/or diagnosis of SARS-CoV-2 by FDA under an Emergency Use Authorization (EUA). This EUA will remain  in effect (meaning this test can be used) for the duration of the COVID-19 declaration under Section 564(b)(1) of the Act, 21 U.S.C.section 360bbb-3(b)(1), unless the authorization is terminated  or revoked sooner.       Influenza A by PCR NEGATIVE NEGATIVE Final   Influenza B by PCR NEGATIVE NEGATIVE Final    Comment: (NOTE) The Xpert Xpress SARS-CoV-2/FLU/RSV plus assay is intended as an aid in the diagnosis of influenza from Nasopharyngeal swab specimens and should not be used as a sole basis for treatment. Nasal washings and aspirates are unacceptable for Xpert Xpress SARS-CoV-2/FLU/RSV testing.  Fact Sheet for Patients: EntrepreneurPulse.com.au  Fact Sheet for Healthcare Providers: IncredibleEmployment.be  This test is not yet approved or cleared by the [place] FDA and has been authorized for detection and/or diagnosis of SARS-CoV-2 by FDA under an Emergency Use Authorization (EUA). This EUA will remain in effect (meaning this test can be used) for the duration of the COVID-19 declaration under Section 564(b)(1) of the Act, 21 U.S.C. section 360bbb-3(b)(1), unless the authorization is terminated  or revoked.  Performed at [facility], [address] [place], [place] [address]   Blood culture (routine x 2)     Status: None   Collection Time: 06/27/21 12:43 AM   Specimen: BLOOD  Result Value Ref Range Status   Specimen Description BLOOD RIGHT ASSIST CONTROL  Final   Special Requests   Final    BOTTLES DRAWN AEROBIC AND ANAEROBIC Blood Culture results may not be optimal due to an inadequate volume of blood received in culture bottles   Culture   Final    NO GROWTH 5 DAYS Performed at [facility], [address], [place], [place] [address]    Report Status 07/02/2021 FINAL  Final  Blood culture (routine x 2)     Status: None   Collection Time: 06/27/21  1:45 AM   Specimen: BLOOD  Result Value Ref Range Status   Specimen Description BLOOD RIGHT ASSIST CONTROL  Final   Special Requests   Final    BOTTLES DRAWN AEROBIC AND ANAEROBIC Blood Culture adequate volume   Culture   Final  NO GROWTH 5 DAYS Performed at [facility], [address] [place], [place] [address]    Report Status 07/02/2021 FINAL  Final  Urine Culture     Status: Abnormal   Collection Time: 06/27/21  2:39 AM   Specimen: In/Out Cath Urine  Result Value Ref Range Status   Specimen Description   Final    IN/OUT CATH URINE Performed at [facility], [address] [place], [place] [address]    Special Requests   Final    NONE Performed at [facility], [place], [place] [address]    Culture 30,000 COLONIES/mL YEAST (A)  Final   Report Status 06/28/2021 FINAL  Final  MRSA Next Gen by PCR, Nasal     Status: None   Collection Time: 06/30/21  5:01 AM   Specimen: Nasal Mucosa; Nasal Swab  Result Value Ref Range Status   MRSA by PCR Next Gen NOT DETECTED NOT DETECTED Final    Comment: (NOTE) The GeneXpert MRSA Assay (FDA approved for NASAL specimens only), is one component of a comprehensive MRSA colonization surveillance program. It  is not intended to diagnose MRSA infection nor to guide or monitor treatment for MRSA infections. Test performance is not FDA approved in patients less than 56 years old. Performed at [facility], [address], [place], [place] [address]   Urine Culture     Status: Abnormal   Collection Time: 07/05/21 10:36 AM   Specimen: PATH Other; Urine  Result Value Ref Range Status   Specimen Description   Final    URINE, RANDOM LEFT RENAL PELVIS URINE Performed at [facility], [address], [place], [place] [address]    Special Requests   Final    NONE Performed at [facility], [address] [place], [place] [address]    Culture 70,000 COLONIES/mL CANDIDA GLABRATA (A)  Final   Report Status 07/12/2021 FINAL  Final  Urine Culture     Status: Abnormal (Preliminary result)   Collection Time: 07/05/21 10:48 AM   Specimen: PATH Other; Urine  Result Value Ref Range Status   Specimen Description   Final    URINE, RANDOM RIGHT KIDNEY URINE Performed at [facility], [address], [place], [place] [address]    Special Requests   Final    NONE Performed at [facility], [address] [place], [place] [address]    Culture (A)  Final    >=100,000 COLONIES/mL CANDIDA GLABRATA Sent to [facility] for further susceptibility testing. Performed at [facility], [place] [address], [place],  [address]    Report Status PENDING  Incomplete  Antifungal AST 9 Drug Panel     Status: None   Collection Time: 07/05/21 10:48 AM  Result Value Ref Range Status   Organism ID, Yeast Candida glabrata  Corrected    Comment: (NOTE) Identification performed by account, not confirmed by this laboratory. CORRECTED ON 08/21 AT 1535: PREVIOUSLY REPORTED AS Preliminary report    Amphotericin B MIC 0.5 ug/mL  Final    Comment: (NOTE) Breakpoints have been established for only some organism-drug combinations as indicated. This test was developed and  its performance characteristics determined by [facility]. It has not been cleared or approved by the Food and Drug Administration.    Anidulafungin MIC Comment  Final    Comment: (NOTE) 0.03 ug/mL Susceptible Breakpoints have been established for only some organism-drug combinations as indicated. This test was developed and its  performance characteristics determined by [facility]. It has not been cleared or approved by the Food and Drug Administration.    Caspofungin MIC Comment  Final    Comment: (NOTE) 0.06 ug/mL Susceptible Breakpoints have been established for only some organism-drug combinations as indicated. This test was developed and its performance characteristics determined by [facility]. It has not been cleared or approved by the Food and Drug Administration.    Micafungin MIC Comment  Final    Comment: (NOTE) 0.016 ug/mL Susceptible Breakpoints have been established for only some organism-drug combinations as indicated. This test was developed and its performance characteristics determined by [facility]. It has not been cleared or approved by the Food and Drug Administration.    Posaconazole MIC 0.25 ug/mL  Final    Comment: (NOTE) Breakpoints have been established for only some organism-drug combinations as indicated. This test was developed and its performance characteristics determined by [facility]. It has not been cleared or approved by the Food and Drug Administration.    Fluconazole Islt MIC 2.0 ug/mL  Final    Comment: (NOTE) Susceptible Dose Dependent Breakpoints have been established for only some organism-drug combinations as indicated. This test was developed and its performance characteristics determined by [facility]. It has not been cleared or approved by the Food and Drug Administration.    Flucytosine MIC 0.06 ug/mL or less  Final    Comment: (NOTE) Breakpoints have been established for only some organism-drug combinations as indicated. This test was  developed and its performance characteristics determined by [facility]. It has not been cleared or approved by the Food and Drug Administration.    Itraconazole MIC 0.12 ug/mL  Final    Comment: (NOTE) Breakpoints have been established for only some organism-drug combinations as indicated. This test was developed and its performance characteristics determined by [facility]. It has not been cleared or approved by the Food and Drug Administration.    Voriconazole MIC 0.06 ug/mL  Final    Comment: (NOTE) Breakpoints have been established for only some organism-drug combinations as indicated. This test was developed and its performance characteristics determined by [facility]. It has not been cleared or approved by the Food and Drug Administration. Performed At: [facility] [address] [place], [place] [id number] Rush Farmer MD HA:[id number]    Source CANDIDA GLABRATA SUSCEPTIBILITY URINE CULTURE  Final    Comment: Performed at [facility], [place] [address], [place], [place] [address]  CULTURE, BLOOD (ROUTINE X 2) w Reflex to ID Panel     Status: None (Preliminary result)   Collection Time: 07/15/21  7:57 PM   Specimen: BLOOD  Result Value Ref Range Status   Specimen Description BLOOD LEFT ASSIST CONTROL  Final   Special Requests   Final    BOTTLES DRAWN AEROBIC AND ANAEROBIC Blood Culture adequate volume   Culture   Final    NO GROWTH 2 DAYS Performed at [facility], [address] [place], [place] [address]    Report Status PENDING  Incomplete  CULTURE, BLOOD (ROUTINE X 2) w Reflex to ID Panel     Status: None (Preliminary result)   Collection Time: 07/15/21  8:07 PM   Specimen: BLOOD  Result Value Ref Range Status   Specimen Description BLOOD LEFT FOREARM  Final   Special Requests   Final    BOTTLES DRAWN AEROBIC AND ANAEROBIC Blood Culture adequate volume   Culture   Final    NO GROWTH 2 DAYS Performed at [facility], [address] [place], [place] [address]    Report Status PENDING  Incomplete    Coagulation Studies: Recent Labs    07/16/21 2216-02-06  LABPROT 15.5*  INR 1.2     Urinalysis: Recent Labs    07/15/21 1722  COLORURINE YELLOW  LABSPEC 1.020  PHURINE 6.0  GLUCOSEU NEGATIVE  HGBUR LARGE*  BILIRUBINUR NEGATIVE  KETONESUR NEGATIVE  PROTEINUR >300*  NITRITE NEGATIVE  LEUKOCYTESUR LARGE*        Imaging: CT ABDOMEN PELVIS WO CONTRAST  Result Date: 07/15/2021 CLINICAL DATA:  Abdominal abscess/infection suspected. EXAM: CT ABDOMEN AND PELVIS WITHOUT CONTRAST TECHNIQUE: Multidetector CT imaging of the abdomen and pelvis was performed following the standard protocol without IV contrast. COMPARISON:  CT abdomen pelvis dated 07/12/2021. FINDINGS: Evaluation of this exam is limited in the absence of intravenous contrast. Lower chest: Bibasilar patchy consolidative changes which may represent atelectasis or infiltrate. Partially visualized tip of a central venous line or dialysis catheter in the right atrium close to the cavoatrial junction. No intra-abdominal free air.  No free fluid. Hepatobiliary: The liver is grossly unremarkable. No calcified gallstone pericholecystic fluid. Percutaneous subhepatic drainage catheter in similar position. No fluid collection identified adjacent to the catheter. Pancreas: Unremarkable. No pancreatic ductal dilatation or surrounding inflammatory changes. Spleen: Normal in size without focal abnormality. Adrenals/Urinary Tract: The adrenal glands are unremarkable. Nonobstructing bilateral renal calculi measure up to 11 mm in the inferior pole of the right kidney. Bilateral ureteral stents in similar position. There is mild bilateral hydronephrosis similar to prior CT. No significant interval change in the fluid along the anterior aspect of the left kidney. The urinary bladder is collapsed around a Foley catheter. Stomach/Bowel: A rectal tube is in place. An enteric tube with tip in  the body of the stomach noted. There is sigmoid diverticulosis and scattered colonic diverticula without active inflammatory changes. There is no bowel obstruction. The appendix is normal. Vascular/Lymphatic: Mild aortoiliac atherosclerotic disease. The IVC is unremarkable. No portal venous gas. There is no adenopathy. Reproductive: The uterus is anteverted. Probable small uterine fibroid. No adnexal masses. Other: Percutaneous drainage catheter in the right hemipelvis in similar position. No fluid collection noted adjacent to the catheter. Musculoskeletal: Diffuse subcutaneous edema. No fluid collection. There is osteopenia with degenerative changes of the spine and hips. No acute osseous pathology. IMPRESSION: 1. No acute intra-abdominal or pelvic pathology. Stable positioning of two percutaneous drainage catheters. 2. Colonic diverticulosis. No bowel obstruction. Normal appendix. 3. Bilateral ureteral stents in similar position. Mild bilateral hydronephrosis similar to prior CT. 4. Nonobstructing bilateral renal calculi. 5. Bibasilar patchy consolidative changes which may represent atelectasis or infiltrate. 6. Aortic Atherosclerosis (ICD10-I70.0). Electronically Signed   By: Anner Crete M.D.   On: 07/15/2021 22:06   DG Abd 1 View  Result Date: 07/16/2021 CLINICAL DATA:  Abdominal pain EXAM: ABDOMEN - 1 VIEW COMPARISON:  Eighty-five 22.  CT 07/15/2021. FINDINGS: Bilateral ureteral stents in place. NG tube in place with the tip in the stomach. Surgical drains noted in the abdomen and pelvis. Nonobstructive bowel gas pattern. No organomegaly or free air. IMPRESSION: No acute findings. Electronically Signed   By: Rolm Baptise M.D.   On: 07/16/2021 23:26   DG Chest Port 1 View  Result Date: 07/16/2021 CLINICAL DATA:  Abdominal pain EXAM: PORTABLE CHEST 1 VIEW COMPARISON:  07/15/2021 FINDINGS: NG tube is in the stomach. Left dialysis catheter is in the right atrium, unchanged. Bilateral lower lobe  atelectasis or infiltrates. Heart is borderline in size. No effusions  or edema. No acute bony abnormality. IMPRESSION: Bibasilar atelectasis or infiltrates, similar to prior study. Electronically Signed   By: Rolm Baptise M.D.   On: 07/16/2021 23:26   DG Chest Port 1 View  Result Date: 07/15/2021 CLINICAL DATA:  Fever. EXAM: PORTABLE CHEST 1 VIEW COMPARISON:  07/13/2021 FINDINGS: Lopez is new LEFT-sided IJ dialysis catheter, tip overlying the level of the UPPER RIGHT atrium. RIGHT IJ line has been removed. Endotracheal tube is absent. A nasogastric tube tip extends beyond the LOWER edge of the image, at least to the level of the gastroesophageal junction. Significant subsegmental atelectasis is again noted but similar to the prior study. Low lung volumes. There is no pneumothorax following new line placement. IMPRESSION: No pneumothorax following dialysis catheter. Bilateral subsegmental atelectasis similar to prior. Electronically Signed   By: Nolon Nations M.D.   On: 07/15/2021 17:21     Medications:    feeding supplement (PIVOT 1.5 CAL) 1,000 mL (07/16/21 1038)   fluconazole (DIFLUCAN) IV Stopped (07/16/21 2257)   piperacillin-tazobactam (ZOSYN)  IV 12.5 mL/hr at 07/17/21 0800    amLODipine  10 mg Per Tube Daily   chlorhexidine  15 mL Mouth Rinse BID   chlorhexidine gluconate (MEDLINE KIT)  15 mL Mouth Rinse BID   Chlorhexidine Gluconate Cloth  6 each Topical Q0600   cloNIDine  0.2 mg Per Tube Daily   feeding supplement (PROSource TF)  45 mL Per Tube Daily   free water  100 mL Per Tube Q4H   heparin injection (subcutaneous)  5,000 Units Subcutaneous Q8H   insulin aspart  0-20 Units Subcutaneous Q4H   insulin aspart  4 Units Subcutaneous Q4H   insulin detemir  20 Units Subcutaneous BID   mouth rinse  15 mL Mouth Rinse q12n4p   metoprolol tartrate  25 mg Per Tube BID   multivitamin  1 tablet Per Tube QHS   nutrition supplement (JUVEN)  1 packet Per Tube BID BM     Assessment/  Plan:  [name] is a 75 y.o. white female with diabetes mellitus type II, hypertension, hyperlipidemia, nephrolithiasis, depression, gout who is admitted to [facility] on 06/27/2021 for Hyperkalemia [E87.5] Hyponatremia [E87.1] ARF (acute renal failure) (HCC) [N17.9] Acute renal failure (ARF) (HCC) [N17.9] Acute respiratory failure with hypoxia ([place]) [J96.01] Demand ischemia of myocardium (HCC) [I24.8] Acute respiratory failure with hypoxemia (HCC) [J96.01] Acute renal failure superimposed on chronic kidney disease, unspecified CKD stage, unspecified acute renal failure type ([place]) [N17.9, N18.9] Sepsis, due to unspecified organism, unspecified whether acute organ dysfunction present [facility]) [A41.9]  Acute Kidney Injury on chronic kidney disease stage IV with baseline creatinine 1.97 and GFR of 25 on 12/22/19. Creatinine now at baseline. BUN trending up.  Acute kidney injury secondary to sepsis and obstructive uropathy Chronic kidney disease is secondary to diabetic nephropathy Continue to hold losartan and metformin Creatinine increasing with adequate UOP No indication for dialysis. Monitor daily dialysis need. Last dialysis was 8/19 Unclear if further dialysis will be of benefit.   Lab Results  Component Value Date   CREATININE 2.02 (H) 07/17/2021   CREATININE 2.08 (H) 07/16/2021   CREATININE 1.95 (H) 07/16/2021    Intake/Output Summary (Last 24 hours) at 07/17/2021 1038 Last data filed at 07/17/2021 0800 Gross per 24 hour  Intake 899.99 ml  Output 3100 ml  Net -2200.01 ml    2. Acute Respiratory failure secondary to community acquired pneumonia failing outpatient antibiotics. Febrile.  - Continue supplement oxygen.  - IV pip/tazo and  fluconazole - 8/20 blood cultures with no growth x 2 days.   3. Anemia of chronic kidney disease Normocytic Lab Results  Component Value Date   HGB 8.1 (L) 07/17/2021   EPO with HD treatment on 8/18   4.  Diabetes mellitus type II with chronic  kidney disease insulin dependent. Most recent hemoglobin A1c is 8.3% on 06/12/21.  Metformin held    5.Bilateral Nephrolithiasis- with obstructive uropathy and left renal abscess Right - Mild to moderate chronic hydronephrosis without obstructing stone Left-mild left hydronephrosis with 14 mm stone at left UPJ- b/l ureteral stent placed 8/10 Lesion at upper pole of left kidney enlarged - likely hematoma or abscess  5. Hypertension with tachycardia: 168/59. Continue clonidine, amlodipine, and metoprolol. Will consider higher dose of metoprolol if needed.   6. Urinary tract infection: candida glabrata.  - fluconazole.   7. Hypernatremia: with free water deficit of 3.6 liters - free water flushes - start D5W infusion.      LOS: [place] 8/22/202210:38 AM

## 2021-07-17 NOTE — Progress Notes (Signed)
[facility] Day(s): 20.   Post op day(s): 4 Days Post-Op.   Interval History:  Patient seen and examined Transferred back to step down unit overnight T-max 103.35F in last 24 hours Patient alert, but unable to participate in history/examination She remains without leukocytosis; WBC 7.1K Hgb stable at 8.1 Renal function stable; sCr - 2.02; UO - 3.0L No significant electrolyte derangements Surgical drains:             - Right mid abdomen (draining repair site); 10 ccs; serous             - RLQ (draining dependent pelvis): 40 ccs; serous Tube feedings advancing; 50 ccs/hr; at goal  She is having bowel function; multiple large loose stools daily; rectal tube in place  Vital signs in last 24 hours: [min-max] current  Temp:  [98.8 F (37.1 C)-103.2 F (39.6 C)] 100.4 F (38 C) (08/22 0547) Pulse Rate:  [104-118] 108 (08/22 0547) Resp:  [17-30] 28 (08/22 0547) BP: (144-174)/(49-76) 160/57 (08/22 0500) SpO2:  [90 %-95 %] 93 % (08/22 0547) Weight:  [125.5 kg] 125.5 kg (08/22 0500)     Height: 5' 2.01" (157.5 cm) Weight: 125.5 kg BMI (Calculated): 50.58   Intake/Output last 2 shifts:  08/21 0701 - 08/22 0700 In: 275 [NG/GT:275] Out: 3100 [Urine:3050; Drains:50]   Physical Exam:  Constitutional: alert, awake, unable to participate in examination  HEENT: NGT in place; receiving tube feedings Respiratory: intubated Cardiovascular: tachycardic, sinus rhythm Gastrointestinal: Soft, unable to reliably assess tenderness, non-distended, surgical drains x2 in right lower abdomen, output serous in both, rectal tube in place  Genitourinary: Foley in place  Integumentary: Midline incision is well healed, I was able to remove staples, no erythema or drainage   Labs:  CBC Latest Ref Rng & Units 07/17/2021 07/16/2021 07/16/2021  WBC 4.0 - 10.5 K/uL 7.1 6.3 6.4  Hemoglobin 12.0 - 15.0 g/dL 8.1(L) 7.9(L) 8.9(L)  Hematocrit 36.0 - 46.0 % 26.7(L)  25.4(L) 28.8(L)  Platelets 150 - 400 K/uL 346 328 328   CMP Latest Ref Rng & Units 07/17/2021 07/16/2021 07/16/2021  Glucose 70 - 99 mg/dL 109(H) 124(H) 149(H)  BUN 8 - 23 mg/dL 83(H) 86(H) 79(H)  Creatinine 0.44 - 1.00 mg/dL 2.02(H) 2.08(H) 1.95(H)  Sodium 135 - 145 mmol/L 149(H) 147(H) 149(H)  Potassium 3.5 - 5.1 mmol/L 3.6 3.7 3.8  Chloride 98 - 111 mmol/L 111 108 106  CO2 22 - 32 mmol/L 31 30 28   Calcium 8.9 - 10.3 mg/dL 8.8(L) 8.7(L) 8.9  Total Protein 6.5 - 8.1 g/dL 6.0(L) 5.8(L) -  Total Bilirubin 0.3 - 1.2 mg/dL 0.5 0.4 -  Alkaline Phos 38 - 126 U/L 107 108 -  AST 15 - 41 U/L 52(H) 50(H) -  ALT 0 - 44 U/L 88(H) 92(H) -     Imaging studies: No new pertinent imaging studies   Assessment/Plan: 75 y.o. female 16 days s/p exploratory laparotomy and primary repair of duodenal ulcer with omental patch   - Monitor fever curve; reviewed work up over the weekend, low suspicion for intra-abdominal source    - Continue tube feedings; at goal. Would defer PO intake until more alert and able to participate    - Okay to continue stool management device  - Appreciate urology assistance; follow up UCx (growing yeast); on Diflucan   - Monitor abdominal examination; on-going bowel function - removed RLQ drain this morning; dressing place. - Maintain Right mid-abdomen surgical drain; monitor and record  output; likely remove later in the week   All of the above findings and recommendations were discussed with the medical team.   -- Edison Simon, PA-C [facility] 07/17/2021, 8:28 AM [phone] M-F: 7am - 4pm

## 2021-07-17 NOTE — Progress Notes (Signed)
Patient ID: Heather Lopez, female   DOB: March 01, 1946, 75 y.o.   MRN: [id number] [facility] PROGRESS NOTE  Heather Lopez PNT:[id number] DOB: Oct 28, 1946 DOA: 06/27/2021 PCP: [name], PA-C  HPI/Subjective: Patient transferred back to the ICU.  Had high fever last night.  Minimally responsive.  Able to wiggle toes and squeeze my hand a little bit.  Unable to talk very much.  Breathing with her mouth open.  Initially admitted 06/27/2021 with acute kidney injury, acute respiratory failure and lactic acidosis and sepsis.  Objective: Vitals:   07/17/21 1200 07/17/21 1256  BP: (!) 153/58   Pulse: (!) 110   Resp: (!) 37   Temp: (!) 100.5 F (38.1 C)   SpO2: 90% 97%    Intake/Output Summary (Last 24 hours) at 07/17/2021 1348 Last data filed at 07/17/2021 1328 Gross per 24 hour  Intake 1250 ml  Output 3785 ml  Net -2535 ml   Filed Weights   07/13/21 0500 07/14/21 0500 07/17/21 0500  Weight: 112 kg 114 kg 125.5 kg    ROS: Review of Systems  Unable to perform ROS: Acuity of condition  Exam: Physical Exam HENT:     Head: Normocephalic.     Mouth/Throat:     Mouth: Mucous membranes are dry.     Pharynx: No oropharyngeal exudate.  Eyes:     General: Lids are normal.     Conjunctiva/sclera: Conjunctivae normal.  Cardiovascular:     Rate and Rhythm: Normal rate and regular rhythm.     Heart sounds: Normal heart sounds, S1 normal and S2 normal.  Pulmonary:     Breath sounds: Examination of the right-lower field reveals decreased breath sounds. Examination of the left-lower field reveals decreased breath sounds. Decreased breath sounds present. No wheezing, rhonchi or rales.  Abdominal:     Palpations: Abdomen is soft.     Tenderness: There is no abdominal tenderness.  Musculoskeletal:     Right lower leg: Swelling present.     Left lower leg: Swelling present.  Skin:    General: Skin is warm.     Findings: No rash.  Neurological:     Mental Status: She is lethargic.       Scheduled Meds:  amLODipine  10 mg Per Tube Daily   chlorhexidine  15 mL Mouth Rinse BID   chlorhexidine gluconate (MEDLINE KIT)  15 mL Mouth Rinse BID   Chlorhexidine Gluconate Cloth  6 each Topical Q0600   cloNIDine  0.2 mg Per Tube Daily   feeding supplement (PROSource TF)  45 mL Per Tube Daily   free water  200 mL Per Tube Q4H   heparin injection (subcutaneous)  5,000 Units Subcutaneous Q8H   insulin aspart  0-20 Units Subcutaneous Q4H   insulin aspart  4 Units Subcutaneous Q4H   insulin detemir  20 Units Subcutaneous BID   mouth rinse  15 mL Mouth Rinse q12n4p   metoprolol tartrate  50 mg Per Tube BID   multivitamin  1 tablet Per Tube QHS   nutrition supplement (JUVEN)  1 packet Per Tube BID BM   Continuous Infusions:  dextrose 50 mL/hr at 07/17/21 1200   feeding supplement (PIVOT 1.5 CAL) 1,000 mL (07/17/21 1328)   fluconazole (DIFLUCAN) IV     And   fluconazole (DIFLUCAN) IV     piperacillin-tazobactam (ZOSYN)  IV Stopped (07/17/21 0930)    Assessment/Plan:  Lethargy.  ABG yesterday did not show CO2 retention.  Made pain medications as needed only instead  of standing dose.  Having fevers so infection could be playing a role with her lethargy. Sepsis with septic shock, present on admission secondary to obstructive uropathy, kidney stone and renal abscess.  Case discussed with infectious disease specialist and will get an ultrasound of the kidney to assess the abscess.  May have to change Diflucan over to Amphotericin but we will see what results of sonogram are first.  Patient did have stent placements on 07/05/2021 by urology.  Patient also on Zosyn. Acute kidney injury on chronic kidney disease stage IV requiring dialysis during the hospital course.  Creatinine peaked at 9.44 and down to 2.02 today. Acute hypoxic respiratory failure.  Extubated 07/14/2021.  BiPAP only if needed.  Continue nasal cannula if possible.  ABG yesterday did not show CO2 retention. Essential  hypertension tachycardia on clonidine amlodipine and metoprolol Duodenal ulcer status post patch repair by general surgery Previous acute atrial flutter with rapid ventricular response on metoprolol Type 2 diabetes mellitus on long-acting insulin twice a day and short acting insulin while on tube feeding Nutrition.  Continue tube feeding Stage II decubitus ulcer, present on admission see full description below Anemia of chronic disease  Pressure Injury 07/07/21 Buttocks Stage 2 -  Partial thickness loss of dermis presenting as a shallow open injury with a red, pink wound bed without slough. (Active)  07/07/21 0800  Location: Buttocks  Location Orientation:   Staging: Stage 2 -  Partial thickness loss of dermis presenting as a shallow open injury with a red, pink wound bed without slough.  Wound Description (Comments):   Present on Admission: No       Code Status:     Code Status Orders  (From admission, onward)           Start     Ordered   07/12/21 1523  Do not attempt resuscitation (DNR)  Continuous       Question Answer Comment  In the event of cardiac or respiratory ARREST Do not call a "code blue"   In the event of cardiac or respiratory ARREST Do not perform Intubation, CPR, defibrillation or ACLS   In the event of cardiac or respiratory ARREST Use medication by any route, position, wound care, and other measures to relive pain and suffering. May use oxygen, suction and manual treatment of airway obstruction as needed for comfort.      07/12/21 1523           Code Status History     Date Active Date Inactive Code Status Order ID Comments User Context   06/27/2021 0445 07/12/2021 1523 Full Code [id number]  Rise Patience, MD ED      Advance Directive Documentation    Flowsheet Row Most Recent Value  Type of Advance Directive Healthcare Power of Attorney  Pre-existing out of facility DNR order (yellow form or pink MOST form) --  "MOST" Form in Place? --       Family Communication: Spoke with husband on the phone Disposition Plan: Status is: Inpatient  Dispo: The patient is from: Home              Anticipated d/c is to: To be determined based on clinical course              Patient currently back in the ICU.  Not ready for discharge at this point   Difficult to place patient.  No.  Consultants: Critical care specialist Infectious disease Urology General surgery Nephrology  Procedures: Ureteral stents  Duodenal ulcer repair  Antibiotics: Diflucan and Zosyn  Time spent: 27 minutes, case discussed with infectious disease and critical care specialist    Antone Summons [facility]  [facility]

## 2021-07-17 NOTE — Progress Notes (Signed)
PT Cancellation Note  Patient Details Name: Heather Lopez MRN: [id number] DOB: Apr 24, 1946   Cancelled Treatment:    Reason Eval/Treat Not Completed: Other (comment). Per chart review, pt transferred back into CCU and is minimally responsive. Per chart, pt could be in dying process. Discussed with MD. Will hold therapy this date and re-attempt next date if appropriate.   Caydance Kuehnle 07/17/2021, 2:31 PM Greggory Stallion, PT, DPT [phone]

## 2021-07-17 NOTE — Progress Notes (Signed)
Nutrition Follow-up  DOCUMENTATION CODES:  Obesity unspecified  INTERVENTION:  Adjust to the following TF regimen: Nepro at 67m/hr + Pro-Source 474mBID via tube Free water flushes 200 ml q4 hours per MD  Regimen provides 2204kcal/day, 119g/day protein and 2072 ml/day free water (TF+flush) Juven Fruit Punch BID, each serving provides 95kcal and 2.5g of protein (amino acids glutamine and arginine) Rena-vit daily via tube   NUTRITION DIAGNOSIS:  Increased nutrient needs related to acute illness, post-op healing (sepsis, ARF on HD) as evidenced by estimated needs.  GOAL:  Provide needs based on ASPEN/SCCM guidelines -met with tube feeds   MONITOR:  Vent status, Labs, Weight trends, TF tolerance, Skin, I & O's  ASSESSMENT:  [age].o. year old female with PMH of diabetes, CKD III, nephrolithiasis with chronic right hydroureteronephrosis, HLD, depression/anxiety, IBS and gout who is admitted with PNA, ARF and perforated duodenal ulcer now s/p exploratory laparotomy and primary repair of perforated duodenal ulcer with creation of omental flap 8/5.  Pt made DNR/DNI and extubated 8/19 with no plans to re-intubate. Has required continuous BiPAP. NGT in place with TF continuing to infuse at 5039m. Pt minimally responsive and breathing with mouth open at the time of assessment. Discussed in rounds, prognosis is guarded at this time. Will reassess needs for extubation and use 8/3 weight as it appears to be around baseline from PTA. Edema present currently.   Intake/Output Summary (Last 24 hours) at 07/17/2021 1558 Last data filed at 07/17/2021 1500 Gross per 24 hour  Intake 1399.07 ml  Output 3785 ml  Net -2385.93 ml  Net IO Since Admission: -6,969.12 mL [07/17/21 1558]  Nutritionally Relevant Medications: Scheduled Meds:  feeding supplement (PROSource TF)  45 mL Per Tube Daily   free water  200 mL Per Tube Q4H   insulin aspart  0-20 Units Subcutaneous Q4H   insulin aspart  4 Units  Subcutaneous Q4H   insulin detemir  20 Units Subcutaneous BID   multivitamin  1 tablet Per Tube QHS   nutrition supplement (JUVEN)  1 packet Per Tube BID BM   Continuous Infusions:  dextrose 50 mL/hr at 07/17/21 1500   feeding supplement (PIVOT 1.5 CAL) 1,000 mL (07/17/21 1328)   piperacillin-tazobactam (ZOSYN)  IV Stopped (07/17/21 0930)   PRN Meds: ondansetron   Labs Reviewed: Na 149 BUN 83, Creatinine 2.02 SBG ranges from 90-176 mg/dL over the last 24 hours  Diet Order:   Diet Order     None      EDUCATION NEEDS:  Not appropriate for education at this time  Skin:  Skin Assessment: Reviewed RN Assessment (Stage 2 sacrum, incision abdomen, MASD to lower breast)  Last BM:  8/22 - type 7 fecal management system in place  Height:  Ht Readings from Last 1 Encounters:  07/11/21 5' 2.01" (1.575 m)    Weight:  Wt Readings from Last 1 Encounters:  07/17/21 125.5 kg    Ideal Body Weight:  50 kg  BMI:  Body mass index is 50.58 kg/m.  Estimated Nutritional Needs:  Kcal:  2200-2400 kcal/d Protein:  110-120 g/d Fluid:  1.8-2 L/d   RacRanell PatrickD, LDN Clinical Dietitian Pager on [facility]

## 2021-07-17 NOTE — Progress Notes (Signed)
GOALS OF CARE DISCUSSION  The Clinical status was relayed to family in detail. Husband At bedside   Updated and notified of patients medical condition.    Patient remains unresponsive and will not open eyes to command.   Patient is having a weak cough and struggling to remove secretions.   Patient with increased WOB and using accessory muscles to breathe Explained to family course of therapy and the modalities    Patient with Progressive multiorgan failure with a very high probablity of a very minimal chance of meaningful recovery despite all aggressive and optimal medical therapy.  PATIENT REMAINS DNR/DNI STATUS  Family understands the situation.  Patient is suffering and suffocating Placed on biPAP High fio2 Patient is in the dying process  Family are satisfied with Plan of action and management. All questions answered  Additional CC time 26 mins   Johnathin Vanderschaaf Patricia Pesa, M.D.  [facility]  Medical Director [place] [facility] Cardio-Pulmonary Department

## 2021-07-17 NOTE — Progress Notes (Signed)
NAME:  Heather Lopez, MRN:  [id number], DOB:  Sep 11, 1946, LOS: 87 ADMISSION DATE:  06/27/2021, CONSULTATION DATE:  06/30/21 REFERRING MD:  Hampton Abbot, MD  CHIEF COMPLAINT:  Abdominal Pain   HPI  75 y.o. with significant PMH as below who presented to the ED on 06/27/21 with chief complaints of progressive shortness of breath, productive cough, loss of appetite, nausea, dry heaving fatigue, malaise and generalized body aches. Patient initially went to her PCP on 7/29 with complaints of rhinorrhea, harsh cough, loss of appetite, nausea, fatigue, malaise and generalized body aches x2 days, patient was diagnosed with pneumonia of which she received ceftriaxone 1 mg IM and sent home with Levaquin, prednisone and Tessalon.   Past Medical History  Diabetes mellitus type 2 Anxiety and depression Hypertension Hyperlipidemia Hydronephrosis and recurrent UTI Gout and osteoarthritis  Significant Hospital Events   8/2: Admitted to MedSurg unit with sepsis 8/3: Vascular consulted for HD catheter 8/4: Patient developed acute abdomen.  CT abdomen shows viscus perforation.  Patient taken to the OR urgently for exploratory lap.  Patient return to ICU on vent.  PCCM consulted 07/01/21- patient had SBT today for recruitment and chest physiotherapy.  Still has significant tachycardia and hypertension when awake (possible pain related) but mentation is good able to follow verbal communication.  07/02/21-patient has failed SBT today due to hypoxemia.  Reviewed care plan with surgery team.  07/03/21- Lung V/Q scan obtained ~ negative for PE.  Weaning FiO2, currently at 50% 8/9 severe resp failure, remains on vent 8/10 s/p Cystoscopy with Bilateral ureteral stent placement (60F/24 cm) for LEFT OBSTRUCTED KIDNEY STONE 8/11 remains on VENT 8/11 failed SAT/SBT  8/12 started on insulin drip for FSBS >400, developed A. fib with RVR CT abdomen pelvis showed decreasing size of left suprarenal collection 8/13 transitioned off insulin, rate  relatively controlled 8/14 issues with increased heart rate when Cardizem decreased.  Currently at 10 mg/h plus Lopressor IV every 6H, consider transitioning Cardizem to via OG in a.m. 8/15 failing weaning trials 8/16 failed weaning trials discussed with family-likely needs [facility] 8/19 extubated to BiPAP 8/21 pt transferred to the stepdown unit due to tachycardia; became febrile; and worsening acute metabolic encephalopathy  1/74: Pt with worsening acute hypoxic respiratory failure requiring continuous Bipap   Consults:  Nephrology Vascular PCCM General Surgery Urology ID  Procedures:  8/5: Exploratory Lap  8/10: Cystoscopy, bilateral ureteral stent placement, bilateral retrograde pyelography   Significant Diagnostic Tests:  8/2: Chest Xray>left basilar consolidation or atelectasis and suspected small left pleural effusion 8/2: Renal ultrasound> moderate right hydronephrosis, mild left hydronephrosis new with bilateral nephrolithiasis 8/4: CTA abdomen and pelvis> Extensive pneumoperitoneum compatible with perforated viscus. Gastric perforation is suspected, though bowel wall defect is not definitively identified. Surgical consultation is recommended. 2. Large ill-defined mass arising from the upper pole left kidney consistent with renal cell carcinoma. Dedicated renal MRI may be useful when clinical situation permits. 3. Acute left-sided hydronephrosis due to a 12 mm left UPJ calculus. 4. Chronic right hydronephrosis and hydroureter without clear etiology. 5. Bilateral nonobstructing renal calculi as above. 6. Small volume ascites. 7. Trace bilateral pleural effusions with patchy bibasilar atelectasis. 8/7: Venous US BLE>> negative for DVT 8/8: Lung V/Q Scan>>Negative for pulmonary embolus. 8/9: CT Abd/Pelvis>>Ill-defined low density arising from upper pole of left kidney appears to have significantly enlarged in size, currently measuring 7.0 x 4.2 cm. There is also noted  significantly increased perinephric inflammation anteriorly in the left pararenal space, and these findings are concerning  for abscess or hematoma. Further evaluation with CT scan of the abdomen with intravenous contrast is recommended. Bilateral nephrolithiasis is noted. Mild to moderate right hydroureteronephrosis is noted without obstructing calculus. Mild left hydronephrosis is noted secondary to 14 mm calculus at the left ureteropelvic junction. Surgical drain is seen between the stomach and inferior portion of the left hepatic lobe, as well as another surgical drain seen within the pelvis. No definite abscess is seen in this area. Irregular wall thickening is seen involving the descending colon concerning for infectious or inflammatory colitis. Mild bilateral posterior basilar subsegmental atelectasis is noted. Aortic Atherosclerosis (ICD10-I70.0). 8/20: CT Abd Pelvis>>No acute intra-abdominal or pelvic pathology. Stable positioning of two percutaneous drainage catheters. Colonic diverticulosis. No bowel obstruction. Normal appendix. Bilateral ureteral stents in similar position. Mild bilateral hydronephrosis similar to prior CT. Nonobstructing bilateral renal calculi. Bibasilar patchy consolidative changes which may represent atelectasis or infiltrate.  Aortic Atherosclerosis (ICD10-I70.0).  Micro Data:  8/2: SARS-CoV-2 PCR> negative 8/2: Influenza PCR> negative 8/2: Blood culture x2> no growth 8/2: Urine Culture> 30,000 colonies yeast 8/2: Legionella urinary antigen> negative 8/10: 70,000 colonies yeast, ID pend  Antimicrobials:  8/4 Fluconazole> increased to high dose 8/12> 8/4 Zosyn>8/13>restarted 08/20 due to fevers   HPI/INTERVAL CHANGES Pt with worsening acute hypoxic respiratory failure O2 sats low 80's on NRB and increased work of breath respiratory rate 30-40's  REVIEW OF SYSTEMS PATIENT IS UNABLE TO PROVIDE COMPLETE REVIEW OF SYSTEMS DUE TO SEVERE CRITICAL ILLNESS AND TOXIC  METABOLIC ENCEPHALOPATHY  PHYSICAL EXAMINATION: GENERAL: Acutely ill appearing female, in severe respiratory distress placed on Bipap  EYES: Pupils equal, round, reactive to light.  No scleral icterus.  MOUTH: Dry mucous membranes  PULMONARY: Rhonchi throughout, tachypneic, labored  CARDIOVASCULAR: Sinus tach, no R/G, 2+ radial/2+ distal pulses present  GASTROINTESTINAL: mildly distended, Midline incision with dressing dry and intact. JP Drains x 2 MUSCULOSKELETAL: trace pitting edema NEUROLOGIC: unable to follow commands, eyes and mouth opened  SKIN: surgical drains x2 right lower quadrant with serous drainage   OBJECTIVE  Blood pressure (!) 166/58, pulse (!) 112, temperature 98.9 F (37.2 C), temperature source Oral, resp. rate (!) 31, height 5' 2.01" (1.575 m), weight 125.5 kg, SpO2 94 %.        Intake/Output Summary (Last 24 hours) at 07/17/2021 1042 Last data filed at 07/17/2021 0800 Gross per 24 hour  Intake 899.99 ml  Output 3100 ml  Net -2200.01 ml   Filed Weights   07/13/21 0500 07/14/21 0500 07/17/21 0500  Weight: 112 kg 114 kg 125.5 kg      Labs   CBC: Recent Labs  Lab 07/14/21 0429 07/15/21 1036 07/16/21 0444 07/16/21 2217 07/17/21 0434  WBC 5.0 5.2 6.4 6.3 7.1  NEUTROABS  --   --   --  4.5  --   HGB 7.7* 8.5* 8.9* 7.9* 8.1*  HCT 24.2* 26.6* 28.8* 25.4* 26.7*  MCV 86.4 87.5 88.6 89.1 87.5  PLT 186 267 328 328 500    Basic Metabolic Panel: Recent Labs  Lab 07/12/21 0501 07/13/21 0435 07/14/21 0429 07/15/21 1036 07/16/21 0444 07/16/21 2217 07/17/21 0434  NA 142 146* 143 145 149* 147* 149*  K 3.4* 3.5 3.4* 4.0 3.8 3.7 3.6  CL 107 113* 104 104 106 108 111  CO2 '25 26 29 28 28 30 31  ' GLUCOSE 185* 235* 139* 208* 149* 124* 109*  BUN 84* 106* 53* 67* 79* 86* 83*  CREATININE 2.17* 2.45* 1.63* 1.97* 1.95* 2.08* 2.02*  CALCIUM 8.4*  8.3* 8.1* 8.4* 8.9 8.7* 8.8*  MG 1.7 2.0  --  1.9 2.0  --  2.0  PHOS 2.7 3.1  --  3.1 3.2  --  2.8   GFR: Estimated  Creatinine Clearance: 31 mL/min (A) (by C-G formula based on SCr of 2.02 mg/dL (H)). Recent Labs  Lab 07/15/21 1036 07/16/21 0444 07/16/21 2217 07/17/21 0026 07/17/21 0434  PROCALCITON  --   --  1.54  --   --   WBC 5.2 6.4 6.3  --  7.1  LATICACIDVEN  --   --  1.9 1.7  --     Liver Function Tests: Recent Labs  Lab 07/13/21 0500 07/15/21 1036 07/16/21 2217 07/17/21 0434  AST 52* 59* 50* 52*  ALT 114* 139* 92* 88*  ALKPHOS 107 124 108 107  BILITOT 0.5 0.7 0.4 0.5  PROT 5.3* 5.8* 5.8* 6.0*  ALBUMIN 1.7* 1.7* 1.7* 1.8*   ABG    Component Value Date/Time   PHART 7.47 (H) 07/16/2021 1447   PCO2ART 42 07/16/2021 1447   PO2ART 71 (L) 07/16/2021 1447   HCO3 30.6 (H) 07/16/2021 1447   ACIDBASEDEF 6.7 (H) 07/06/2021 0951   O2SAT 95.1 07/16/2021 1447      Allergies Allergies  Allergen Reactions   Contrast Media  [Iodinated Diagnostic Agents] Anaphylaxis   Iodine Swelling    (IV only) - angioedema    Scheduled Meds:  amLODipine  10 mg Per Tube Daily   chlorhexidine  15 mL Mouth Rinse BID   chlorhexidine gluconate (MEDLINE KIT)  15 mL Mouth Rinse BID   Chlorhexidine Gluconate Cloth  6 each Topical Q0600   cloNIDine  0.2 mg Per Tube Daily   feeding supplement (PROSource TF)  45 mL Per Tube Daily   free water  100 mL Per Tube Q4H   heparin injection (subcutaneous)  5,000 Units Subcutaneous Q8H   insulin aspart  0-20 Units Subcutaneous Q4H   insulin aspart  4 Units Subcutaneous Q4H   insulin detemir  20 Units Subcutaneous BID   mouth rinse  15 mL Mouth Rinse q12n4p   metoprolol tartrate  25 mg Per Tube BID   multivitamin  1 tablet Per Tube QHS   nutrition supplement (JUVEN)  1 packet Per Tube BID BM   Continuous Infusions:  feeding supplement (PIVOT 1.5 CAL) 1,000 mL (07/16/21 1038)   fluconazole (DIFLUCAN) IV Stopped (07/16/21 2257)   piperacillin-tazobactam (ZOSYN)  IV 12.5 mL/hr at 07/17/21 0800   PRN Meds:.acetaminophen (TYLENOL) oral liquid 160 mg/5 mL, heparin,  hydrALAZINE, lidocaine (PF), lidocaine-prilocaine, ondansetron (ZOFRAN) IV, oxyCODONE, oxyCODONE, pentafluoroprop-tetrafluoroeth  Active Hospital Problem list   Acute hypoxic respiratory failure ventilator dependent Severe sepsis with septic shock, resolved Bowel perforation s/p ex-lap AKI on CKD stage IV > iHD Right UPJ stone with hydronephrosis s/p stent placement Left renal abscess Anion gap metabolic acidosis, resolved Lactic acidosis, resolved Diabetes mellitus A. fib flutter with RVR, resolved Candida glabrata pyelonephrosis Acute metabolic encephalopathy   Assessment & Plan:   75 y.o. F Acute Hypoxic Respiratory Failure in setting of Community Acquired Pneumonia,  bilateral pleural effusions with associated atelectasis complicated by  perforated Duodenal Ulcer with peritonitis Status post laparotomy Status post ureteral stent placement Left renal abscess, appears to be resolving Candiduria  Failure to wean from vent -S/p exploratory laparotomy and primary repair of duodenal ulcer with omental patch on 06/29/21 -S/P bilateral ureteral stent placement 8/10 -Surgery and urology consulted appreciate input   Severe ACUTE Hypoxic Respiratory Failure suspect secondary to  acute CHF exacerbation  -EXTUBATED 8/19 -Prn Bipap for dyspnea and/or hypoxia -Pt to undergo hemodialysis treatment 08/22  SOURCE-Severe Sepsis secondary to UTI, Community Acquired Pneumonia and perforated Duodenal Ulcer with peritonitis -ID following for antibiotic management~pt with recurrent fevers per ID US renal ordered to determine if abscess on the left upper pole can be drained  -Fluconazole per ID  ACUTE SYSTOLIC CARDIAC FAILURE AFIB WITH RVR, resolved HYPERTENSION -aim for daily negative fluid balance -continue clonidine, amlodipine, increase metoprolol to 50 mg bid, and prn hydralazine for bp management   ACUTE KIDNEY INJURY/Renal Failure Hypernatremia  -Continue Foley Catheter-assess need -Avoid  nephrotoxic agents -Follow urine output, BMP -Ensure adequate renal perfusion, optimize oxygenation -Renal dose medications -Increase free water flush to 200 ml q4hrs and start D5W'@50'  ml/hr  -PermCath replaced 8/19; Nephrology consulted appreciate input~HD per recommendations   Acute metabolic encephalopathy  -Avoid sedating medications when able -MR Brain once stable from respiratory standpoint    Intake/Output Summary (Last 24 hours) at 07/17/2021 1042 Last data filed at 07/17/2021 0800 Gross per 24 hour  Intake 899.99 ml  Output 3100 ml  Net -2200.01 ml    ACUTE ANEMIA- Transfuse for Hgb <7  Hyperglycemia  CBG's q4hrs SSI; scheduled novolog and levemir   ELECTROLYTES -follow labs as needed -replace as needed -pharmacy consultation and following   Best practice:  Diet:  Tube Feed ;  Pain/Anxiety/Delirium protocol (if indicated): No VAP protocol (if indicated): Not indicated DVT prophylaxis: SQH GI prophylaxis: PPI Glucose control:  SSI Yes Central venous access:  left ij dual lumen permcath placed per vascular surgery 07/13/2021 Arterial line:  N/A Foley:  Yes, and it is still needed Mobility:  OOB with PT  PT consulted: Yes Last date of multidisciplinary goals of care discussion [8/22] Code Status:  full code Disposition: Stepdown Unit   Discussed decline in pts condition and treatment plan with pts husband at [place] Time: 8 minutes   Rosilyn Mings, [place] Pager [phone] (please enter 7 digits) PCCM Consult Pager [phone] (please enter 7 digits)

## 2021-07-17 NOTE — Progress Notes (Signed)
SLP Cancellation Note  Patient Details Name: Heather Lopez MRN: [id number] DOB: [birth year]   Cancelled treatment:       Reason Eval/Treat Not Completed: Medical issues which prohibited therapy;Patient not medically ready (chart reviewed; MD consulted). Per chart notes, pt was extubated to BiPAP post ~15 days. Pt remains with worsening acute hypoxic respiratory failure requiring continuous BIPAP. Lethargic.  Pt is not appropriate for BSE at this time. ST services will monitor next 1-2 days for appropriateness for assessment. MD agreed.  Recommend oral care for hygiene and stimulation of swallowing when able.     Orinda Kenner, MS, CCC-SLP Speech Language Pathologist Rehab Services [phone] [facility] 07/17/2021, 1:24 PM

## 2021-07-17 NOTE — Progress Notes (Signed)
Date of Admission:  06/27/2021      ID: Heather Lopez is a 75 y.o. female  Active Problems:   Uncontrolled type 2 diabetes mellitus with insulin therapy ([place])   Hyperlipidemia associated with type 2 diabetes mellitus ([place])   Type 2 diabetes mellitus with stage 4 chronic kidney disease, with long-term current use of insulin (HCC)   Acute respiratory failure with hypoxia (HCC)   Severe sepsis with septic shock (HCC)   Acute lower UTI   Acute kidney injury superimposed on chronic kidney disease (HCC)   Pneumoperitoneum   Pressure injury of skin   Atrial flutter with rapid ventricular response (HCC)   Endotracheally intubated   Bilateral nephrolithiasis   Anemia of chronic disease   Demand ischemia of myocardium (HCC)   Lethargy   Duodenal ulcer perforation (HCC)    Subjective: Patient had fever over the weekend. More obtunded today Nephrology will restart hemodialysis today   Medications:   amLODipine  10 mg Per Tube Daily   chlorhexidine  15 mL Mouth Rinse BID   chlorhexidine gluconate (MEDLINE KIT)  15 mL Mouth Rinse BID   Chlorhexidine Gluconate Cloth  6 each Topical Q0600   cloNIDine  0.2 mg Per Tube Daily   feeding supplement (PROSource TF)  45 mL Per Tube Daily   free water  200 mL Per Tube Q4H   heparin injection (subcutaneous)  5,000 Units Subcutaneous Q8H   insulin aspart  0-20 Units Subcutaneous Q4H   insulin aspart  4 Units Subcutaneous Q4H   insulin detemir  20 Units Subcutaneous BID   mouth rinse  15 mL Mouth Rinse q12n4p   metoprolol tartrate  50 mg Per Tube BID   multivitamin  1 tablet Per Tube QHS   nutrition supplement (JUVEN)  1 packet Per Tube BID BM    Objective: Vital signs in last 24 hours: Temp:  [98.8 F (37.1 C)-103.2 F (39.6 C)] 98.9 F (37.2 C) (08/22 0800) Pulse Rate:  [104-118] 112 (08/22 0800) Resp:  [17-31] 31 (08/22 0800) BP: (144-174)/(49-76) 166/58 (08/22 0800) SpO2:  [90 %-95 %] 94 % (08/22 0800) Weight:  [125.5 kg] 125.5 kg  (08/22 0500)  PHYSICAL EXAM:  General: Obtunded, respiratory distress, has BiPAP Lungs: Bilateral air entry Decreased bases  heart: Tachycardia. Abdomen: Soft,  distended.  JP drain present.  Cannot be Extremities: atraumatic, no cyanosis. No edema. No clubbing Skin: No rashes or lesions. Or bruising Lymph: Cervical, supraclavicular normal. Neurologic: Assessed  Lab Results Recent Labs    07/16/21 2217 07/17/21 0434  WBC 6.3 7.1  HGB 7.9* 8.1*  HCT 25.4* 26.7*  NA 147* 149*  K 3.7 3.6  CL 108 111  CO2 30 31  BUN 86* 83*  CREATININE 2.08* 2.02*   Liver Panel Recent Labs    07/15/21 1036 07/16/21 2217 07/17/21 0434  PROT 5.8* 5.8* 6.0*  ALBUMIN 1.7* 1.7* 1.8*  AST 59* 50* 52*  ALT 139* 92* 88*  ALKPHOS 124 108 107  BILITOT 0.7 0.4 0.5  BILIDIR 0.1  --  <0.1  IBILI 0.6  --  NOT CALCULATED   Sedimentation Rate No results for input(s): ESRSEDRATE in the last 72 hours. C-Reactive Protein No results for input(s): CRP in the last 72 hours.  Microbiology:  Studies/Results: CT ABDOMEN PELVIS WO CONTRAST  Result Date: 07/15/2021 CLINICAL DATA:  Abdominal abscess/infection suspected. EXAM: CT ABDOMEN AND PELVIS WITHOUT CONTRAST TECHNIQUE: Multidetector CT imaging of the abdomen and pelvis was performed following the standard protocol  without IV contrast. COMPARISON:  CT abdomen pelvis dated 07/12/2021. FINDINGS: Evaluation of this exam is limited in the absence of intravenous contrast. Lower chest: Bibasilar patchy consolidative changes which may represent atelectasis or infiltrate. Partially visualized tip of a central venous line or dialysis catheter in the right atrium close to the cavoatrial junction. No intra-abdominal free air.  No free fluid. Hepatobiliary: The liver is grossly unremarkable. No calcified gallstone pericholecystic fluid. Percutaneous subhepatic drainage catheter in similar position. No fluid collection identified adjacent to the catheter. Pancreas:  Unremarkable. No pancreatic ductal dilatation or surrounding inflammatory changes. Spleen: Normal in size without focal abnormality. Adrenals/Urinary Tract: The adrenal glands are unremarkable. Nonobstructing bilateral renal calculi measure up to 11 mm in the inferior pole of the right kidney. Bilateral ureteral stents in similar position. There is mild bilateral hydronephrosis similar to prior CT. No significant interval change in the fluid along the anterior aspect of the left kidney. The urinary bladder is collapsed around a Foley catheter. Stomach/Bowel: A rectal tube is in place. An enteric tube with tip in the body of the stomach noted. There is sigmoid diverticulosis and scattered colonic diverticula without active inflammatory changes. There is no bowel obstruction. The appendix is normal. Vascular/Lymphatic: Mild aortoiliac atherosclerotic disease. The IVC is unremarkable. No portal venous gas. There is no adenopathy. Reproductive: The uterus is anteverted. Probable small uterine fibroid. No adnexal masses. Other: Percutaneous drainage catheter in the right hemipelvis in similar position. No fluid collection noted adjacent to the catheter. Musculoskeletal: Diffuse subcutaneous edema. No fluid collection. There is osteopenia with degenerative changes of the spine and hips. No acute osseous pathology. IMPRESSION: 1. No acute intra-abdominal or pelvic pathology. Stable positioning of two percutaneous drainage catheters. 2. Colonic diverticulosis. No bowel obstruction. Normal appendix. 3. Bilateral ureteral stents in similar position. Mild bilateral hydronephrosis similar to prior CT. 4. Nonobstructing bilateral renal calculi. 5. Bibasilar patchy consolidative changes which may represent atelectasis or infiltrate. 6. Aortic Atherosclerosis (ICD10-I70.0). Electronically Signed   By: Anner Crete M.D.   On: 07/15/2021 22:06   DG Abd 1 View  Result Date: 07/16/2021 CLINICAL DATA:  Abdominal pain EXAM:  ABDOMEN - 1 VIEW COMPARISON:  Eighty-five 22.  CT 07/15/2021. FINDINGS: Bilateral ureteral stents in place. NG tube in place with the tip in the stomach. Surgical drains noted in the abdomen and pelvis. Nonobstructive bowel gas pattern. No organomegaly or free air. IMPRESSION: No acute findings. Electronically Signed   By: Rolm Baptise M.D.   On: 07/16/2021 23:26   DG Chest Port 1 View  Result Date: 07/16/2021 CLINICAL DATA:  Abdominal pain EXAM: PORTABLE CHEST 1 VIEW COMPARISON:  07/15/2021 FINDINGS: NG tube is in the stomach. Left dialysis catheter is in the right atrium, unchanged. Bilateral lower lobe atelectasis or infiltrates. Heart is borderline in size. No effusions or edema. No acute bony abnormality. IMPRESSION: Bibasilar atelectasis or infiltrates, similar to prior study. Electronically Signed   By: Rolm Baptise M.D.   On: 07/16/2021 23:26   DG Chest Port 1 View  Result Date: 07/15/2021 CLINICAL DATA:  Fever. EXAM: PORTABLE CHEST 1 VIEW COMPARISON:  07/13/2021 FINDINGS: Patient is new LEFT-sided IJ dialysis catheter, tip overlying the level of the UPPER RIGHT atrium. RIGHT IJ line has been removed. Endotracheal tube is absent. A nasogastric tube tip extends beyond the LOWER edge of the image, at least to the level of the gastroesophageal junction. Significant subsegmental atelectasis is again noted but similar to the prior study. Low lung volumes. There is  no pneumothorax following new line placement. IMPRESSION: No pneumothorax following dialysis catheter. Bilateral subsegmental atelectasis similar to prior. Electronically Signed   By: Nolon Nations M.D.   On: 07/15/2021 17:21     Assessment/Plan: Patient admitted with shortness of breath and posterior leg pneumonia.  She then developed abdominal pain found to have free air in abdomen.  Taken for laparoscopic surgery and found to have duodenal perforation.  The perforation was closed.  This happened on 06/30/2021.  Patient has had 2 JP  drains since then.  She completed Zosyn.  And then after a period of 5 days Zosyn was restarted over the weekend because of fever.  AKI on hemodialysis Bilateral hydronephrosis secondary to nephrolithiasis.  Status post stent placement. Candida glabrata complicated UTI.  Currently on fluconazole 400 mg. Increase to 857m  Because of fever over the weekend we may have to switch the fluconazole to amphotericin B because of glabrata.repeat [place]oreadone today. IF abscess present  in left kidney, will ask IR to drain  Anemia Received blood transfusion Heparin has been switched off  A. fib on metoprolol  Discussed the management with her husband in detail and with care team

## 2021-07-17 NOTE — Progress Notes (Signed)
Patient started treatment as ordered. Patient arterial pressure alarmed, nurse attempted to correct alarm 3-4 times with no successful. Patient rinse back to avoid line clotting. No clot noted to arterial chamber, very light clotting to venous chamber. Unsure of source of alarm. Patient had 42 minutes left of treatment. Dr. Juleen China notified. Report given to patient nurse Marisue Humble RN.

## 2021-07-18 ENCOUNTER — Inpatient Hospital Stay: Payer: Medicare Other

## 2021-07-18 DIAGNOSIS — N390 Urinary tract infection, site not specified: Secondary | ICD-10-CM | POA: Diagnosis not present

## 2021-07-18 DIAGNOSIS — J9601 Acute respiratory failure with hypoxia: Secondary | ICD-10-CM | POA: Diagnosis not present

## 2021-07-18 DIAGNOSIS — N179 Acute kidney failure, unspecified: Secondary | ICD-10-CM | POA: Diagnosis not present

## 2021-07-18 DIAGNOSIS — N189 Chronic kidney disease, unspecified: Secondary | ICD-10-CM | POA: Diagnosis not present

## 2021-07-18 DIAGNOSIS — N170 Acute kidney failure with tubular necrosis: Secondary | ICD-10-CM | POA: Diagnosis not present

## 2021-07-18 LAB — PROCALCITONIN: Procalcitonin: 1.88 ng/mL

## 2021-07-18 LAB — BASIC METABOLIC PANEL
Anion gap: 14 (ref 5–15)
BUN: 71 mg/dL — ABNORMAL HIGH (ref 8–23)
CO2: 32 mmol/L (ref 22–32)
Calcium: 9 mg/dL (ref 8.9–10.3)
Chloride: 98 mmol/L (ref 98–111)
Creatinine, Ser: 1.82 mg/dL — ABNORMAL HIGH (ref 0.44–1.00)
GFR, Estimated: 29 mL/min — ABNORMAL LOW (ref >60)
Glucose, Bld: 232 mg/dL — ABNORMAL HIGH (ref 70–99)
Potassium: 3.4 mmol/L — ABNORMAL LOW (ref 3.5–5.1)
Sodium: 144 mmol/L (ref 135–145)

## 2021-07-18 LAB — GLUCOSE, CAPILLARY
Glucose-Capillary: 202 mg/dL — ABNORMAL HIGH (ref 70–99)
Glucose-Capillary: 212 mg/dL — ABNORMAL HIGH (ref 70–99)
Glucose-Capillary: 217 mg/dL — ABNORMAL HIGH (ref 70–99)
Glucose-Capillary: 219 mg/dL — ABNORMAL HIGH (ref 70–99)
Glucose-Capillary: 225 mg/dL — ABNORMAL HIGH (ref 70–99)
Glucose-Capillary: 228 mg/dL — ABNORMAL HIGH (ref 70–99)
Glucose-Capillary: 288 mg/dL — ABNORMAL HIGH (ref 70–99)

## 2021-07-18 LAB — CBC
HCT: 26.2 % — ABNORMAL LOW (ref 36.0–46.0)
Hemoglobin: 7.9 g/dL — ABNORMAL LOW (ref 12.0–15.0)
MCH: 26.3 pg (ref 26.0–34.0)
MCHC: 30.2 g/dL (ref 30.0–36.0)
MCV: 87.3 fL (ref 80.0–100.0)
Platelets: 379 10*3/uL (ref 150–400)
RBC: 3 MIL/uL — ABNORMAL LOW (ref 3.87–5.11)
RDW: 15.9 % — ABNORMAL HIGH (ref 11.5–15.5)
WBC: 7.9 10*3/uL (ref 4.0–10.5)
nRBC: 0 % (ref 0.0–0.2)

## 2021-07-18 LAB — URINALYSIS, ROUTINE W REFLEX MICROSCOPIC
Bilirubin Urine: NEGATIVE
Glucose, UA: 50 mg/dL — AB
Ketones, ur: NEGATIVE mg/dL
Nitrite: NEGATIVE
Protein, ur: 100 mg/dL — AB
RBC / HPF: >50 RBC/hpf — ABNORMAL HIGH (ref 0–5)
Specific Gravity, Urine: 1.014 (ref 1.005–1.030)
Squamous Epithelial / HPF: NONE SEEN (ref 0–5)
WBC, UA: >50 WBC/hpf — ABNORMAL HIGH (ref 0–5)
pH: 5 (ref 5.0–8.0)

## 2021-07-18 LAB — MAGNESIUM: Magnesium: 1.9 mg/dL (ref 1.7–2.4)

## 2021-07-18 LAB — PHOSPHORUS: Phosphorus: 2.7 mg/dL (ref 2.5–4.6)

## 2021-07-18 MED ORDER — POTASSIUM CHLORIDE 20 MEQ PO PACK
20.0000 meq | PACK | Freq: Once | ORAL | Status: AC
  Administered 2021-07-18: 20 meq
  Filled 2021-07-18: qty 1

## 2021-07-18 MED ORDER — EPOETIN ALFA 10000 UNIT/ML IJ SOLN
10000.0000 [IU] | INTRAMUSCULAR | Status: DC
  Administered 2021-07-19 – 2021-07-21 (×2): 10000 [IU] via INTRAVENOUS
  Filled 2021-07-18: qty 1

## 2021-07-18 MED ORDER — MAGNESIUM SULFATE 2 GM/50ML IV SOLN
2.0000 g | Freq: Once | INTRAVENOUS | Status: AC
  Administered 2021-07-18: 2 g via INTRAVENOUS
  Filled 2021-07-18: qty 50

## 2021-07-18 MED ORDER — INSULIN ASPART 100 UNIT/ML IJ SOLN: 6.0000 [IU] | INTRAMUSCULAR | Status: DC

## 2021-07-18 MED ORDER — INSULIN ASPART 100 UNIT/ML IJ SOLN
6.0000 [IU] | INTRAMUSCULAR | Status: AC
Start: 2021-07-18 — End: 2021-08-02
  Administered 2021-07-18 – 2021-07-31 (×78): 6 [IU] via SUBCUTANEOUS
  Filled 2021-07-18 (×78): qty 1

## 2021-07-18 MED ORDER — POTASSIUM CHLORIDE 20 MEQ PO PACK: 40.0000 meq | PACK | Freq: Once | ORAL | Status: DC

## 2021-07-18 MED ORDER — CLONIDINE HCL 0.1 MG PO TABS
0.1000 mg | ORAL_TABLET | Freq: Every day | ORAL | Status: DC
  Administered 2021-07-19 – 2021-08-03 (×16): 0.1 mg
  Filled 2021-07-18 (×15): qty 1

## 2021-07-18 MED ORDER — FAMOTIDINE 40 MG/5ML PO SUSR
20.0000 mg | Freq: Every day | ORAL | Status: DC
  Administered 2021-07-18: 20 mg via ORAL
  Filled 2021-07-18 (×3): qty 2.5

## 2021-07-18 NOTE — TOC Initial Note (Deleted)
Transition of Care [facility]) - Initial/Assessment Note    Patient Details  Name: Heather Lopez MRN: [id number] Date of Birth: 07/07/1946  [facility] of Care [facility]) CM/SW Contact:    Adelene Amas, [place] Phone Number: 07/18/2021, 11:02 AM  Clinical Narrative:                   Patient presents to [facility] due to 5 days of cough, congestion, loss of appetite, nausea, dry heaving, fatigue, malaise, body aches. Patient saw her PCP and is negative COVID and flu.  She was diagnosed with pneumonia and put on Levaquin and steroids. Patient was placed on SEPSIS protocol. Renal ultrasound found to have bilateral hydronephrosis. Patient has a history of obstructive uropathy and has required ureteral stents in the past.    Collateral information form patient's spouse Summerlynn, [place] [phone] [facility] Phone).  The patient works and is mostly independent but has experienced ambulation issues die to bilateral hip pain.  Patient was scheduled for hip surgery on 07/19/2021. Patient used either a cane or wheelchair to ambulate.   Patient complained of abdominal pain and presented for surgery w/ the diagnosis of ESRD for a DIALYSIS/PERMA CATHETER INSERTION (N/A) as a surgical intervention. Patient started dialysis on 06/29/2021.   Expected Discharge Plan: Skilled Nursing Facility Barriers to Discharge: Continued Medical Work up   Patient Goals and CMS Choice        Expected Discharge Plan and Services Expected Discharge Plan: [employer] In-house Referral: Clinical Social Work   Post Acute Care Choice: [place] Living arrangements for the past 2 months: [place]                                      Prior Living Arrangements/Services Living arrangements for the past 2 months: Single Family Home Lives with:: Spouse Patient language and need for interpreter reviewed:: Yes Do you feel safe going back to the place where you live?: Yes      Need for Family  Participation in Patient Care: Yes (Comment) Care giver support system in place?: Yes (comment)   Criminal Activity/Legal Involvement Pertinent to Current Situation/Hospitalization: No - Comment as needed  Activities of Daily Living Home Assistive Devices/Equipment: CBG Meter, Eyeglasses ADL Screening (condition at time of admission) Patient's cognitive ability adequate to safely complete daily activities?: Yes Is the patient deaf or have difficulty hearing?: No Does the patient have difficulty seeing, even when wearing glasses/contacts?: No Does the patient have difficulty concentrating, remembering, or making decisions?: No Patient able to express need for assistance with ADLs?: Yes Does the patient have difficulty dressing or bathing?: Yes Independently performs ADLs?: No Communication: Independent Dressing (OT): Dependent Is this a change from baseline?: Pre-admission baseline Grooming: Dependent Is this a change from baseline?: Pre-admission baseline Feeding: Independent Bathing: Dependent Is this a change from baseline?: Pre-admission baseline Toileting: Dependent Is this a change from baseline?: Pre-admission baseline In/Out Bed: Dependent Is this a change from baseline?: Pre-admission baseline Walks in Home: Dependent Is this a change from baseline?: Pre-admission baseline Does the patient have difficulty walking or climbing stairs?: Yes Weakness of Legs: Both Weakness of Arms/Hands: Both  Permission Sought/Granted Permission sought to share information with : Family Supports    Share Information with NAME: Ieesha, Abbasi (Spouse)   [phone] (Home Phone)           Emotional Assessment Appearance:: Appears stated age  Attitude/Demeanor/Rapport: Unable to Assess Affect (typically observed): Unable to Assess   Alcohol / Substance Use: Not Applicable Psych Involvement: No (comment)  Admission diagnosis:  Hyperkalemia [E87.5] Hyponatremia [E87.1] ARF (acute  renal failure) (HCC) [N17.9] Acute renal failure (ARF) (HCC) [N17.9] Acute respiratory failure with hypoxia (HCC) [J96.01] Demand ischemia of myocardium (HCC) [I24.8] Acute respiratory failure with hypoxemia (HCC) [J96.01] Acute renal failure superimposed on chronic kidney disease, unspecified CKD stage, unspecified acute renal failure type ([place]) [N17.9, N18.9] Sepsis, due to unspecified organism, unspecified whether acute organ dysfunction present [facility]) [A41.9] Patient Active Problem List   Diagnosis Date Noted   Renal abscess    Essential hypertension    Lethargy    Duodenal ulcer perforation ([place])    Demand ischemia of myocardium ([place])    Atrial flutter with rapid ventricular response ([place]) 07/07/2021   Endotracheally intubated 07/07/2021   Bilateral nephrolithiasis 07/07/2021   Anemia of chronic disease 07/07/2021   Pressure injury of skin 06/30/2021   Pneumoperitoneum    Acute respiratory failure with hypoxia ([place]) 06/27/2021   Severe sepsis with septic shock ([place]) 06/27/2021   Acute lower UTI 06/27/2021   Acute kidney injury superimposed on chronic kidney disease ([place]) 06/27/2021   Type 2 diabetes mellitus with stage 4 chronic kidney disease, with long-term current use of insulin ([place]) 03/03/2018   Vitamin D deficiency 08/21/2016   IBS (irritable bowel syndrome) 08/21/2016   Special screening for malignant neoplasms, colon    Benign neoplasm of transverse colon    Benign neoplasm of sigmoid colon    Uncontrolled type 2 diabetes mellitus with insulin therapy ([place]) 07/12/2015   Benign hypertension with CKD (chronic kidney disease) stage III ([place]) 07/12/2015   Hyperlipidemia associated with type 2 diabetes mellitus ([place]) 07/12/2015   Depression 07/12/2015   BMI 40.0-44.9, adult ([place]) 07/12/2015   PCP:  [name], PA-C Pharmacy:   [facility] - [place], [place] - [address] [place] [address] Phone: [phone] Fax:  [phone]  [facility] [place], [place] [address] [place] [address] Phone: [phone] Fax: [phone]  [facility] [place], [place] - [address] [place] [address] Phone: [phone] Fax: [phone]     Social Determinants of Health (SDOH) Interventions    Readmission Risk Interventions No flowsheet data found.

## 2021-07-18 NOTE — TOC Progression Note (Signed)
Transition of Care [facility]) - Progression Note    Patient Details  Name: Heather Lopez MRN: [id number] Date of Birth: 21-Sep-1946  Transition of Care [facility]) CM/SW [place], [place] Phone Number: [phone] 07/18/2021, 10:59 AM  Clinical Narrative:     Patient more medically stable, responding to commands, HD M,W,F regimen, better mentation.  CSW called and left voicemail for patient's Baily, [place] (Spouse) [phone] [facility] Phone) to discuss discharge plan.   Expected Discharge Plan: [place] Barriers to Discharge: Continued Medical Work up  Expected Discharge Plan and Services Expected Discharge Plan: [place] In-house Referral: Clinical Social Work   Post Acute Care Choice: [place] Living arrangements for the past 2 months: Single Family Home                                       Social Determinants of Health (SDOH) Interventions    Readmission Risk Interventions No flowsheet data found.

## 2021-07-18 NOTE — Progress Notes (Signed)
SLP Cancellation Note  Patient Details Name: Heather Lopez MRN: [id number] DOB: [birth year]   Cancelled treatment:       Reason Eval/Treat Not Completed: Fatigue/lethargy limiting ability to participate;Patient not medically ready (chart reviewed; consulted NSG re: pt's status this PM. NSG suggested holding on eval until tomorrow d/t lethargy.). ST will f/u in the morning. Recommend frequent oral care for hygiene and stimulation of swallowing.     Orinda Kenner, MS, CCC-SLP Speech Language Pathologist Rehab Services [phone] [facility] 07/18/2021, 4:04 PM

## 2021-07-18 NOTE — Progress Notes (Signed)
Updated pts husband at bedside regarding pts condition and current plan of care.  All questions were answered.  Will continue to monitor and assess pt.   Rosilyn Mings, AGNP  Pulmonary/Critical Care Pager [phone] (please enter 7 digits) PCCM Consult Pager [phone] (please enter 7 digits)

## 2021-07-18 NOTE — Progress Notes (Signed)
ID Patient is a little less somnolent today Off BiPAP On high flow nasal cannula Trying to talk No fever in 24 hours  BP (!) 151/61 (BP Location: Left Wrist)   Pulse (!) 108   Temp 99.8 F (37.7 C) (Oral)   Resp (!) 34   Ht 5' 2.01" (1.575 m)   Wt 125.9 kg   SpO2 96%   BMI 50.75 kg/m    Chest bilateral air entry Heart sound S1-S2 Abdomen soft Surgical site no discharge or erythema JP drain Left IJ hemodialysis catheter   Labs CBC Latest Ref Rng & Units 07/18/2021 07/17/2021 07/16/2021  WBC 4.0 - 10.5 K/uL 7.9 7.1 6.3  Hemoglobin 12.0 - 15.0 g/dL 7.9(L) 8.1(L) 7.9(L)  Hematocrit 36.0 - 46.0 % 26.2(L) 26.7(L) 25.4(L)  Platelets 150 - 400 K/uL 379 346 328    CMP Latest Ref Rng & Units 07/18/2021 07/17/2021 07/16/2021  Glucose 70 - 99 mg/dL 232(H) 109(H) 124(H)  BUN 8 - 23 mg/dL 71(H) 83(H) 86(H)  Creatinine 0.44 - 1.00 mg/dL 1.82(H) 2.02(H) 2.08(H)  Sodium 135 - 145 mmol/L 144 149(H) 147(H)  Potassium 3.5 - 5.1 mmol/L 3.4(L) 3.6 3.7  Chloride 98 - 111 mmol/L 98 111 108  CO2 22 - 32 mmol/L 32 31 30  Calcium 8.9 - 10.3 mg/dL 9.0 8.8(L) 8.7(L)  Total Protein 6.5 - 8.1 g/dL - 6.0(L) 5.8(L)  Total Bilirubin 0.3 - 1.2 mg/dL - 0.5 0.4  Alkaline Phos 38 - 126 U/L - 107 108  AST 15 - 41 U/L - 52(H) 50(H)  ALT 0 - 44 U/L - 88(H) 92(H)    Micro 07/15/2021 blood culture no growth 07/05/2021 urine culture Candida glabrata Fluconazole MIC 2   Imaging CT abdomen done on 07/15/2021 Sigmoid diverticulosis No active inflammatory changes No bowel obstruction Bilateral ureteral stents in similar position Mild bilateral hydronephrosis Bilateral nonobstructing renal calculi measuring up to 11 mm Percutaneous drainage catheter in the right hemipelvis Diffuse subcutaneous edema No fluid collection.No acute intra-abdominal or pelvic pathology seen  Impression/recommendation  Duodenal perforation.  Status post laparotomy with closure.  On 06/30/2021 2 JP drains since then.  She completed  Zosyn and then after a period of 5 days of Zosyn she was restarted over the weekend because of fever. No fever in 24 hours  Candida glabrata complicated UTI. Bilateral hydronephrosis secondary to nephrolithiasis.  Status post stent placement Currently on fluconazole which was increased to 800 mg yesterday for Candida glabrata.  MIC of fluconazole is to a susceptible but needs higher dose No need for amphotericin B currently Also ultrasound done yesterday did not show any renal abscess.  Anemia has received blood transfusion A. fib on metoprolol  Acute hypoxic respiratory failure status post extubation.  On high flow oxygen  Encephalopathy  Discussed the management with the care team.

## 2021-07-18 NOTE — Progress Notes (Signed)
[facility]  ROUNDING NOTE   Subjective:   Heather Lopez is a 75 y.o. white female who has been admitted for community acquired pneumonia.    Hospital course complicated by duodenal perforation s/p repair on 06/30/2021 ARF - requiring HD as needed. Last hemodialysis was 8/18.  Acute resp failure requiring mech ventilation Left obstructive hydronephrosis and renal abscess, Patient underwent cystoscopy and b/l ureteral stent placement 8/10  UOP 2325 (3034m)   Tmax 101.1  Hemodialysis treatment yesterday. Completed 2 hours 15 minutes before catheter started havign high arterial pressures. Tolerated treatment otherwise. No ultrafiltration.   Mental status has improved this morning.   Husband at bedside.   Metoprolol dose increased yesterday to 564mbid   Objective:  Vital signs in last 24 hours:  Temp:  [98.8 F (37.1 C)-101.1 F (38.4 C)] 99.7 F (37.6 C) (08/23 0809) Pulse Rate:  [102-127] 106 (08/23 0809) Resp:  [17-37] 21 (08/23 0809) BP: (125-171)/(56-87) 161/74 (08/23 0809) SpO2:  [90 %-100 %] 100 % (08/23 0814) FiO2 (%):  [60 %-90 %] 60 % (08/23 0814) Weight:  [125.9 kg] 125.9 kg (08/23 0500)  Weight change: 0.435 kg Filed Weights   07/14/21 0500 07/17/21 0500 07/18/21 0500  Weight: 114 kg 125.5 kg 125.9 kg    Intake/Output: I/O last 3 completed shifts: In: 2997.4 [I.V.:555.3; NG/GT:1349; IV Piggyback:1093.2] Out: 350240Urine:3700; Drains:20]   Intake/Output this shift:  Total I/O In: 270.7 [I.V.:248.5; IV Piggyback:22.2] Out: 1125 [Urine:1125]  Physical Exam: General: Critically ill  Head: [place]/AT  Eyes: Anicteric  Lungs:  +coarse breath sounds, BIPAP  Heart: tachycardia  Abdomen:  +JP drains, midline incision  Extremities:  trace peripheral edema   Neurologic:  Following commands  Skin: No lesions  GU Foley with yellow urine  Access:  LIJ permcath 07/04/72  Basic Metabolic Panel: Recent Labs  Lab 07/13/21 0435 07/14/21 0429  07/15/21 1036 07/16/21 0444 07/16/21 2217 07/17/21 0434 07/18/21 0511  NA 146*   < > 145 149* 147* 149* 144  K 3.5   < > 4.0 3.8 3.7 3.6 3.4*  CL 113*   < > 104 106 108 111 98  CO2 26   < > '28 28 30 31 ' 32  GLUCOSE 235*   < > 208* 149* 124* 109* 232*  BUN 106*   < > 67* 79* 86* 83* 71*  CREATININE 2.45*   < > 1.97* 1.95* 2.08* 2.02* 1.82*  CALCIUM 8.3*   < > 8.4* 8.9 8.7* 8.8* 9.0  MG 2.0  --  1.9 2.0  --  2.0 1.9  PHOS 3.1  --  3.1 3.2  --  2.8 2.7   < > = values in this interval not displayed.     Liver Function Tests: Recent Labs  Lab 07/13/21 0500 07/15/21 1036 07/16/21 2217 07/17/21 0434  AST 52* 59* 50* 52*  ALT 114* 139* 92* 88*  ALKPHOS 107 124 108 107  BILITOT 0.5 0.7 0.4 0.5  PROT 5.3* 5.8* 5.8* 6.0*  ALBUMIN 1.7* 1.7* 1.7* 1.8*    No results for input(s): LIPASE, AMYLASE in the last 168 hours. No results for input(s): AMMONIA in the last 168 hours.  CBC: Recent Labs  Lab 07/15/21 1036 07/16/21 0444 07/16/21 2217 07/17/21 0434 07/18/21 0511  WBC 5.2 6.4 6.3 7.1 7.9  NEUTROABS  --   --  4.5  --   --   HGB 8.5* 8.9* 7.9* 8.1* 7.9*  HCT 26.6* 28.8* 25.4* 26.7* 26.2*  MCV 87.5 88.6 89.1 87.5 87.3  PLT 267 328 328 346 379     Cardiac Enzymes: No results for input(s): CKTOTAL, CKMB, CKMBINDEX, TROPONINI in the last 168 hours.  BNP: Invalid input(s): POCBNP  CBG: Recent Labs  Lab 07/17/21 1629 07/17/21 1932 07/18/21 0025 07/18/21 0343 07/18/21 0730  GLUCAP 270* 248* 219* 225* 57*     Microbiology: Results for orders placed or performed during the hospital encounter of 06/27/21  Resp Panel by RT-PCR (Flu A&B, Covid) Nasopharyngeal Swab     Status: None   Collection Time: 06/27/21 12:32 AM   Specimen: Nasopharyngeal Swab; Nasopharyngeal(NP) swabs in vial transport medium  Result Value Ref Range Status   SARS Coronavirus 2 by RT PCR NEGATIVE NEGATIVE Final    Comment: (NOTE) SARS-CoV-2 target nucleic acids are NOT DETECTED.  The  SARS-CoV-2 RNA is generally detectable in upper respiratory specimens during the acute phase of infection. The lowest concentration of SARS-CoV-2 viral copies this assay can detect is 138 copies/mL. A negative result does not preclude SARS-Cov-2 infection and should not be used as the sole basis for treatment or other patient management decisions. A negative result may occur with  improper specimen collection/handling, submission of specimen other than nasopharyngeal swab, presence of viral mutation(s) within the areas targeted by this assay, and inadequate number of viral copies(<138 copies/mL). A negative result must be combined with clinical observations, patient history, and epidemiological information. The expected result is Negative.  Fact Sheet for Patients:  EntrepreneurPulse.com.au  Fact Sheet for Healthcare Providers:  IncredibleEmployment.be  This test is no t yet approved or cleared by the [place] FDA and  has been authorized for detection and/or diagnosis of SARS-CoV-2 by FDA under an Emergency Use Authorization (EUA). This EUA will remain  in effect (meaning this test can be used) for the duration of the COVID-19 declaration under Section 564(b)(1) of the Act, 21 U.S.C.section 360bbb-3(b)(1), unless the authorization is terminated  or revoked sooner.       Influenza A by PCR NEGATIVE NEGATIVE Final   Influenza B by PCR NEGATIVE NEGATIVE Final    Comment: (NOTE) The Xpert Xpress SARS-CoV-2/FLU/RSV plus assay is intended as an aid in the diagnosis of influenza from Nasopharyngeal swab specimens and should not be used as a sole basis for treatment. Nasal washings and aspirates are unacceptable for Xpert Xpress SARS-CoV-2/FLU/RSV testing.  Fact Sheet for Patients: EntrepreneurPulse.com.au  Fact Sheet for Healthcare Providers: IncredibleEmployment.be  This test is not yet approved or  cleared by the [place] FDA and has been authorized for detection and/or diagnosis of SARS-CoV-2 by FDA under an Emergency Use Authorization (EUA). This EUA will remain in effect (meaning this test can be used) for the duration of the COVID-19 declaration under Section 564(b)(1) of the Act, 21 U.S.C. section 360bbb-3(b)(1), unless the authorization is terminated or revoked.  Performed at [facility], [address] [place], [place] [address]   Blood culture (routine x 2)     Status: None   Collection Time: 06/27/21 12:43 AM   Specimen: BLOOD  Result Value Ref Range Status   Specimen Description BLOOD RIGHT ASSIST CONTROL  Final   Special Requests   Final    BOTTLES DRAWN AEROBIC AND ANAEROBIC Blood Culture results may not be optimal due to an inadequate volume of blood received in culture bottles   Culture   Final    NO GROWTH 5 DAYS Performed at [facility], [address], [place], [place] [address]  Report Status 07/02/2021 FINAL  Final  Blood culture (routine x 2)     Status: None   Collection Time: 06/27/21  1:45 AM   Specimen: BLOOD  Result Value Ref Range Status   Specimen Description BLOOD RIGHT ASSIST CONTROL  Final   Special Requests   Final    BOTTLES DRAWN AEROBIC AND ANAEROBIC Blood Culture adequate volume   Culture   Final    NO GROWTH 5 DAYS Performed at [facility], [address] [place], [place] [address]    Report Status 07/02/2021 FINAL  Final  Urine Culture     Status: Abnormal   Collection Time: 06/27/21  2:39 AM   Specimen: In/Out Cath Urine  Result Value Ref Range Status   Specimen Description   Final    IN/OUT CATH URINE Performed at [facility], [address] [place], [place] [address]    Special Requests   Final    NONE Performed at [facility], [place], [place] [address]    Culture 30,000 COLONIES/mL YEAST (A)  Final   Report Status 06/28/2021 FINAL  Final   MRSA Next Gen by PCR, Nasal     Status: None   Collection Time: 06/30/21  5:01 AM   Specimen: Nasal Mucosa; Nasal Swab  Result Value Ref Range Status   MRSA by PCR Next Gen NOT DETECTED NOT DETECTED Final    Comment: (NOTE) The GeneXpert MRSA Assay (FDA approved for NASAL specimens only), is one component of a comprehensive MRSA colonization surveillance program. It is not intended to diagnose MRSA infection nor to guide or monitor treatment for MRSA infections. Test performance is not FDA approved in patients less than 78 years old. Performed at [facility], [address] [place], [place] [address]   Urine Culture     Status: Abnormal   Collection Time: 07/05/21 10:36 AM   Specimen: PATH Other; Urine  Result Value Ref Range Status   Specimen Description   Final    URINE, RANDOM LEFT RENAL PELVIS URINE Performed at [facility], [address] [place], [place] [address]    Special Requests   Final    NONE Performed at [facility], [address] [place], [place] [address]    Culture 70,000 [place] (A)  Final   Report Status 07/12/2021 FINAL  Final  Urine Culture     Status: Abnormal   Collection Time: 07/05/21 10:48 AM   Specimen: PATH Other; Urine  Result Value Ref Range Status   Specimen Description   Final    URINE, RANDOM RIGHT KIDNEY URINE Performed at [facility], [address], [place], [place] [address]    Special Requests   Final    NONE Performed at [facility], [address] [place], [place] [address]    Culture (A)  Final    >=100,000 COLONIES/mL CANDIDA GLABRATA SEE SEPARATE REPORT Performed at [facility], [place] [address] [place],  [address]    Report Status 07/17/2021 FINAL  Final  Antifungal AST 9 Drug Panel     Status: None   Collection Time: 07/05/21 10:48 AM  Result Value Ref Range Status   Organism ID, Yeast Candida glabrata  Corrected    Comment:  (NOTE) Identification performed by account, not confirmed by this laboratory. CORRECTED ON 08/21 AT 1535: PREVIOUSLY REPORTED AS Preliminary report    Amphotericin B MIC 0.5 ug/mL  Final    Comment: (  NOTE) Breakpoints have been established for only some organism-drug combinations as indicated. This test was developed and its performance characteristics determined by [facility]. It has not been cleared or approved by the Food and Drug Administration.    Anidulafungin MIC Comment  Final    Comment: (NOTE) 0.03 ug/mL Susceptible Breakpoints have been established for only some organism-drug combinations as indicated. This test was developed and its performance characteristics determined by [facility]. It has not been cleared or approved by the Food and Drug Administration.    Caspofungin MIC Comment  Final    Comment: (NOTE) 0.06 ug/mL Susceptible Breakpoints have been established for only some organism-drug combinations as indicated. This test was developed and its performance characteristics determined by [facility]. It has not been cleared or approved by the Food and Drug Administration.    Micafungin MIC Comment  Final    Comment: (NOTE) 0.016 ug/mL Susceptible Breakpoints have been established for only some organism-drug combinations as indicated. This test was developed and its performance characteristics determined by [facility]. It has not been cleared or approved by the Food and Drug Administration.    Posaconazole MIC 0.25 ug/mL  Final    Comment: (NOTE) Breakpoints have been established for only some organism-drug combinations as indicated. This test was developed and its performance characteristics determined by [facility]. It has not been cleared or approved by the Food and Drug Administration.    Fluconazole Islt MIC 2.0 ug/mL  Final    Comment: (NOTE) Susceptible Dose Dependent Breakpoints have been established for only some organism-drug combinations as  indicated. This test was developed and its performance characteristics determined by [facility]. It has not been cleared or approved by the Food and Drug Administration.    Flucytosine MIC 0.06 ug/mL or less  Final    Comment: (NOTE) Breakpoints have been established for only some organism-drug combinations as indicated. This test was developed and its performance characteristics determined by [facility]. It has not been cleared or approved by the Food and Drug Administration.    Itraconazole MIC 0.12 ug/mL  Final    Comment: (NOTE) Breakpoints have been established for only some organism-drug combinations as indicated. This test was developed and its performance characteristics determined by [facility]. It has not been cleared or approved by the Food and Drug Administration.    Voriconazole MIC 0.06 ug/mL  Final    Comment: (NOTE) Breakpoints have been established for only some organism-drug combinations as indicated. This test was developed and its performance characteristics determined by [facility]. It has not been cleared or approved by the Food and Drug Administration. Performed At: [facility] [address] [place], [place] [id number] Rush Farmer MD HY:[id number]    Source CANDIDA GLABRATA SUSCEPTIBILITY URINE CULTURE  Final    Comment: Performed at [facility], [place] [address], [place], [place] [address]  CULTURE, BLOOD (ROUTINE X 2) w Reflex to ID Panel     Status: None (Preliminary result)   Collection Time: 07/15/21  7:57 PM   Specimen: BLOOD  Result Value Ref Range Status   Specimen Description BLOOD LEFT ASSIST CONTROL  Final   Special Requests   Final    BOTTLES DRAWN AEROBIC AND ANAEROBIC Blood Culture adequate volume   Culture   Final    NO GROWTH 3 DAYS Performed at [facility], [address] [place], [place] [address]    Report Status PENDING  Incomplete  CULTURE, BLOOD (ROUTINE X 2) w Reflex to ID Panel     Status: None (  Preliminary  result)   Collection Time: 07/15/21  8:07 PM   Specimen: BLOOD  Result Value Ref Range Status   Specimen Description BLOOD LEFT FOREARM  Final   Special Requests   Final    BOTTLES DRAWN AEROBIC AND ANAEROBIC Blood Culture adequate volume   Culture   Final    NO GROWTH 3 DAYS Performed at [facility], [address] [place], [place] [address]    Report Status PENDING  Incomplete    Coagulation Studies: Recent Labs    07/16/21 02/19/2216  LABPROT 15.5*  INR 1.2      Urinalysis: Recent Labs    07/15/21 1722  COLORURINE YELLOW  LABSPEC 1.020  PHURINE 6.0  GLUCOSEU NEGATIVE  HGBUR LARGE*  BILIRUBINUR NEGATIVE  KETONESUR NEGATIVE  PROTEINUR >300*  NITRITE NEGATIVE  LEUKOCYTESUR LARGE*        Imaging: DG Abd 1 View  Result Date: 07/16/2021 CLINICAL DATA:  Abdominal pain EXAM: ABDOMEN - 1 VIEW COMPARISON:  Eighty-five 22.  CT 07/15/2021. FINDINGS: Bilateral ureteral stents in place. NG tube in place with the tip in the stomach. Surgical drains noted in the abdomen and pelvis. Nonobstructive bowel gas pattern. No organomegaly or free air. IMPRESSION: No acute findings. Electronically Signed   By: Rolm Baptise M.D.   On: 07/16/2021 23:26   US RENAL  Result Date: 07/17/2021 CLINICAL DATA:  Renal abscess EXAM: RENAL / URINARY TRACT ULTRASOUND COMPLETE COMPARISON:  07/15/2021 FINDINGS: Right Kidney: Renal measurements: 13.7 x 6.3 x 6.3 cm = volume: 280 mL. Mildly increased renal cortical echogenicity. 1.6 cm echogenic, shadowing stone within the lower pole of the right kidney. No mass or hydronephrosis visualized. Left Kidney: Renal measurements: 12.6 x 6.8 x 6.1 cm = volume: 272 mL. Mildly increased renal cortical echogenicity. Multiple shadowing stones within the left kidney, largest measuring up to 1.2 cm. No mass or hydronephrosis visualized. Bladder: Appears normal for degree of bladder distention. Other: A small amount of free fluid is seen adjacent to both kidneys.  Technical note: Technically limited examination secondary to poor penetration related to patient body habitus and difficulties with patient positioning as patient was unable to roll. IMPRESSION: 1. Limited exam. 2. Bilateral nonobstructing renal calculi. 3. Small amount of free fluid is seen adjacent to both kidneys, not well evaluated sonographically. 4. Mildly increased renal cortical echogenicity suggesting medical renal disease. Electronically Signed   By: Davina Poke D.O.   On: 07/17/2021 19:00   DG Chest Port 1 View  Result Date: 07/16/2021 CLINICAL DATA:  Abdominal pain EXAM: PORTABLE CHEST 1 VIEW COMPARISON:  07/15/2021 FINDINGS: NG tube is in the stomach. Left dialysis catheter is in the right atrium, unchanged. Bilateral lower lobe atelectasis or infiltrates. Heart is borderline in size. No effusions or edema. No acute bony abnormality. IMPRESSION: Bibasilar atelectasis or infiltrates, similar to prior study. Electronically Signed   By: Rolm Baptise M.D.   On: 07/16/2021 23:26     Medications:    dextrose 50 mL/hr at 07/18/21 0811   feeding supplement (NEPRO CARB STEADY) 1,000 mL (07/17/21 1848)   fluconazole (DIFLUCAN) IV Stopped (07/17/21 2300)   And   fluconazole (DIFLUCAN) IV Stopped (07/18/21 0630)   magnesium sulfate bolus IVPB     piperacillin-tazobactam (ZOSYN)  IV 12.5 mL/hr at 07/18/21 0800    amLODipine  10 mg Per Tube Daily   chlorhexidine  15 mL Mouth Rinse BID   chlorhexidine gluconate (MEDLINE KIT)  15 mL Mouth Rinse BID   Chlorhexidine  Gluconate Cloth  6 each Topical Q0600   cloNIDine  0.2 mg Per Tube Daily   feeding supplement (PROSource TF)  45 mL Per Tube BID   free water  200 mL Per Tube Q4H   heparin injection (subcutaneous)  5,000 Units Subcutaneous Q8H   insulin aspart  0-20 Units Subcutaneous Q4H   insulin aspart  6 Units Subcutaneous Q4H   insulin detemir  20 Units Subcutaneous BID   mouth rinse  15 mL Mouth Rinse q12n4p   metoprolol tartrate  50  mg Per Tube BID   multivitamin  1 tablet Per Tube QHS   nutrition supplement (JUVEN)  1 packet Per Tube BID BM   potassium chloride  20 mEq Per Tube Once     Assessment/ Plan:  Heather Lopez is a 75 y.o. white female with diabetes mellitus type II, hypertension, hyperlipidemia, nephrolithiasis, depression, gout who is admitted to [facility] on 06/27/2021 for Hyperkalemia [E87.5] Hyponatremia [E87.1] ARF (acute renal failure) (HCC) [N17.9] Acute renal failure (ARF) (HCC) [N17.9] Acute respiratory failure with hypoxia ([place]) [J96.01] Demand ischemia of myocardium (HCC) [I24.8] Acute respiratory failure with hypoxemia (HCC) [J96.01] Acute renal failure superimposed on chronic kidney disease, unspecified CKD stage, unspecified acute renal failure type ([place]) [N17.9, N18.9] Sepsis, due to unspecified organism, unspecified whether acute organ dysfunction present [facility]) [A41.9]  Acute Kidney Injury on chronic kidney disease stage IV with baseline creatinine 1.97 and GFR of 25 on 12/22/19.Continues to be dialysis dependent despite nonoliguric urine output.  Acute kidney injury secondary to sepsis and obstructive uropathy Chronic kidney disease is secondary to diabetic nephropathy Continue to hold losartan and metformin Plan on intermittent hemodialysis treatment for tomorrow. Schedule for three times a week dialysis.   Lab Results  Component Value Date   CREATININE 1.82 (H) 07/18/2021   CREATININE 2.02 (H) 07/17/2021   CREATININE 2.08 (H) 07/16/2021    Intake/Output Summary (Last 24 hours) at 07/18/2021 0852 Last data filed at 07/18/2021 0814 Gross per 24 hour  Intake 2368.09 ml  Output 3311 ml  Net -942.91 ml    2. Acute Respiratory failure secondary to community acquired pneumonia failing outpatient antibiotics. Febrile.  - Continue supplement oxygen.  - IV pip/tazo and fluconazole - 8/20 blood cultures with no growth x 3 days.   3. Anemia of chronic kidney disease Normocytic Lab Results   Component Value Date   HGB 7.9 (L) 07/18/2021   EPO with HD treatment on 8/18 - Schedule EPO on MWF HD treatments.   4.  Diabetes mellitus type II with chronic kidney disease insulin dependent. Most recent hemoglobin A1c is 8.3% on 06/12/21.  Metformin held    5.Bilateral Nephrolithiasis- with obstructive uropathy and left renal abscess Renal ultrasound reviewed.   5. Hypertension with tachycardia: 161/74 Continue clonidine, amlodipine, and metoprolol. Will consider higher dose of metoprolol if needed.   6. Urinary tract infection: candida glabrata.  - fluconazole. Appreciate critical care input.   7. Hypernatremia: improved with Dialysis.  - free water flushes - Continue D5W infusion.  8. Hypokalemia: secondary to overcorrection with dialysis - potassium supplementation     LOS: 21 Moxon Messler 8/23/20228:52 AM

## 2021-07-18 NOTE — Progress Notes (Signed)
Physical Therapy Treatment Patient Details Name: Heather Lopez MRN: [id number] DOB: 11-08-1946 Today's Date: 07/18/2021    History of Present Illness Pt is a [name] who presented to the ED on 06/27/21 with chief complaints of progressive shortness of breath, productive cough, loss of appetite, nausea, dry heaving fatigue, malaise and generalized body aches. Patient initially went to her PCP on 7/29 with complaints of rhinorrhea, harsh cough, loss of appetite, nausea, fatigue, malaise and generalized body aches x2 days, patient was diagnosed with pneumonia of which she received ceftriaxone 1 mg IM and sent home with Levaquin, prednisone and Tessalon. On 8/4 pt developed acute abdominal pain with CT showing viscus perforation. Pt urgently taken to the OR for ex lap. Pt is also s/p cystoscopy with B ureteral stent placement for L obstructed kidney stone on 8/10.    PT Comments    Pt is making very slow progress. More alert this date, however requires constant cues to keep eyes open and active participation. R UE tone noted during ROM activities with elbow flex/ext. Clonus noted in L foot with grimaces during ROM. Was more participatory with L UE AAROM. Weaned from bipap to 40L of HFNC this date. Repositioned in bed. Will continue to progress as able.  Follow Up Recommendations  SNF;Supervision/Assistance - 24 hour     Equipment Recommendations  None recommended by PT    Recommendations for Other Services       Precautions / Restrictions Precautions Precautions: Fall Precaution Comments: NG tube, abdominal incision & JP drains, foley catheter, fecal tube, L jugular tunneled hemodialysis catheter Restrictions Weight Bearing Restrictions: No    Mobility  Bed Mobility               General bed mobility comments: will require +2 assist, not available during session    Transfers                    Ambulation/Gait                 Stairs             Wheelchair  Mobility    Modified Rankin (Stroke Patients Only)       Balance                                            Cognition Arousal/Alertness: Awake/alert Behavior During Therapy: Flat affect Overall Cognitive Status: Impaired/Different from baseline                                 General Comments: keeps mouth open (lips dry and cracked), with cues can keep eyes open. Unable to speak but does nod head occassionally and does follow simple commands      Exercises Other Exercises Other Exercises: supine ther-ex performed on B UE/LE. B UE grips, elbow flex/ext, shoulder flex. B LE AP, attempted QS, and heel slides to tolerance. 5-10 reps performed. Mainly PROM, however was able to perform AAROM on L UE. Other Exercises: Air mattress noted to be deflated upon arrival. INflated and pillows placed for positioning under extremities.    General Comments        Pertinent Vitals/Pain Pain Assessment: Faces Faces Pain Scale: Hurts little more Pain Location: BLE when PT performs PROM Pain Descriptors / Indicators: Discomfort;Grimacing;Guarding Pain  Intervention(s): Limited activity within patient's tolerance;Repositioned    Home Living                      Prior Function            PT Goals (current goals can now be found in the care plan section) Acute Rehab PT Goals Patient Stated Goal: unable to state PT Goal Formulation: With family Time For Goal Achievement: 07/29/21 Potential to Achieve Goals: Fair Progress towards PT goals: Progressing toward goals    Frequency    Min 2X/week      PT Plan Current plan remains appropriate    Co-evaluation              AM-PAC PT "6 Clicks" Mobility   Outcome Measure  Help needed turning from your back to your side while in a flat bed without using bedrails?: Total Help needed moving from lying on your back to sitting on the side of a flat bed without using bedrails?: Total Help  needed moving to and from a bed to a chair (including a wheelchair)?: Total Help needed standing up from a chair using your arms (e.g., wheelchair or bedside chair)?: Total Help needed to walk in hospital room?: Total Help needed climbing 3-5 steps with a railing? : Total 6 Click Score: 6    End of Session Equipment Utilized During Treatment: Oxygen Activity Tolerance: Patient limited by fatigue Patient left: in bed;with call bell/phone within reach;with bed alarm set;with family/visitor present Nurse Communication: Mobility status PT Visit Diagnosis: Muscle weakness (generalized) (M62.81);Difficulty in walking, not elsewhere classified (R26.2)     Time: 7342-8768 PT Time Calculation (min) (ACUTE ONLY): 23 min  Charges:  $Therapeutic Exercise: 23-37 mins                     Greggory Stallion, PT, DPT [phone]    Heather Lopez 07/18/2021, 11:53 AM

## 2021-07-18 NOTE — Progress Notes (Signed)
NAME:  Heather Lopez, MRN:  [id number], DOB:  03-19-1946, LOS: 21 ADMISSION DATE:  06/27/2021, CONSULTATION DATE:  06/30/21 REFERRING MD:  Hampton Abbot, MD  CHIEF COMPLAINT:  Abdominal Pain   HPI  75 y.o. with significant PMH as below who presented to the ED on 06/27/21 with chief complaints of progressive shortness of breath, productive cough, loss of appetite, nausea, dry heaving fatigue, malaise and generalized body aches. Patient initially went to her PCP on 7/29 with complaints of rhinorrhea, harsh cough, loss of appetite, nausea, fatigue, malaise and generalized body aches x2 days, patient was diagnosed with pneumonia of which she received ceftriaxone 1 mg IM and sent home with Levaquin, prednisone and Tessalon.   Past Medical History  Diabetes mellitus type 2 Anxiety and depression Hypertension Hyperlipidemia Hydronephrosis and recurrent UTI Gout and osteoarthritis  Significant Hospital Events   8/2: Admitted to MedSurg unit with sepsis 8/3: Vascular consulted for HD catheter 8/4: Patient developed acute abdomen.  CT abdomen shows viscus perforation.  Patient taken to the OR urgently for exploratory lap.  Patient return to ICU on vent.  PCCM consulted 07/01/21- patient had SBT today for recruitment and chest physiotherapy.  Still has significant tachycardia and hypertension when awake (possible pain related) but mentation is good able to follow verbal communication.  07/02/21-patient has failed SBT today due to hypoxemia.  Reviewed care plan with surgery team.  07/03/21- Lung V/Q scan obtained ~ negative for PE.  Weaning FiO2, currently at 50% 8/9 severe resp failure, remains on vent 8/10 s/p Cystoscopy with Bilateral ureteral stent placement (74F/24 cm) for LEFT OBSTRUCTED KIDNEY STONE 8/11 remains on VENT 8/11 failed SAT/SBT  8/12 started on insulin drip for FSBS >400, developed A. fib with RVR CT abdomen pelvis showed decreasing size of left suprarenal collection 8/13 transitioned off insulin, rate  relatively controlled 8/14 issues with increased heart rate when Cardizem decreased.  Currently at 10 mg/h plus Lopressor IV every 6H, consider transitioning Cardizem to via OG in a.m. 8/15 failing weaning trials 8/16 failed weaning trials discussed with family-likely needs [employer] [facility] 8/19 extubated to BiPAP 8/21 pt transferred to the stepdown unit due to tachycardia; became febrile; and worsening acute metabolic encephalopathy  7/58: Pt with worsening acute hypoxic respiratory failure requiring continuous Bipap  8/23: Mentation and respiratory status has improved will wean off Bipap and transition to HFNC   Consults:  Nephrology Vascular PCCM General Surgery Urology ID  Procedures:  8/5: Exploratory Lap  8/10: Cystoscopy, bilateral ureteral stent placement, bilateral retrograde pyelography   Significant Diagnostic Tests:  8/2: Chest Xray>left basilar consolidation or atelectasis and suspected small left pleural effusion 8/2: Renal ultrasound> moderate right hydronephrosis, mild left hydronephrosis new with bilateral nephrolithiasis 8/4: CTA abdomen and pelvis> Extensive pneumoperitoneum compatible with perforated viscus. Gastric perforation is suspected, though bowel wall defect is not definitively identified. Surgical consultation is recommended. 2. Large ill-defined mass arising from the upper pole left kidney consistent with renal cell carcinoma. Dedicated renal MRI may be useful when clinical situation permits. 3. Acute left-sided hydronephrosis due to a 12 mm left UPJ calculus. 4. Chronic right hydronephrosis and hydroureter without clear etiology. 5. Bilateral nonobstructing renal calculi as above. 6. Small volume ascites. 7. Trace bilateral pleural effusions with patchy bibasilar atelectasis. 8/7: Venous US BLE>> negative for DVT 8/8: Lung V/Q Scan>>Negative for pulmonary embolus. 8/9: CT Abd/Pelvis>>Ill-defined low density arising from upper pole of left kidney appears to  have significantly enlarged in size, currently measuring 7.0 x 4.2 cm. There is also  noted significantly increased perinephric inflammation anteriorly in the left pararenal space, and these findings are concerning for abscess or hematoma. Further evaluation with CT scan of the abdomen with intravenous contrast is recommended. Bilateral nephrolithiasis is noted. Mild to moderate right hydroureteronephrosis is noted without obstructing calculus. Mild left hydronephrosis is noted secondary to 14 mm calculus at the left ureteropelvic junction. Surgical drain is seen between the stomach and inferior portion of the left hepatic lobe, as well as another surgical drain seen within the pelvis. No definite abscess is seen in this area. Irregular wall thickening is seen involving the descending colon concerning for infectious or inflammatory colitis. Mild bilateral posterior basilar subsegmental atelectasis is noted. Aortic Atherosclerosis (ICD10-I70.0). 8/20: CT Abd Pelvis>>No acute intra-abdominal or pelvic pathology. Stable positioning of two percutaneous drainage catheters. Colonic diverticulosis. No bowel obstruction. Normal appendix. Bilateral ureteral stents in similar position. Mild bilateral hydronephrosis similar to prior CT. Nonobstructing bilateral renal calculi. Bibasilar patchy consolidative changes which may represent atelectasis or infiltrate.  Aortic Atherosclerosis (ICD10-I70.0). 8/22: US Renal>>Limited exam. Bilateral nonobstructing renal calculi. Small amount of free fluid is seen adjacent to both kidneys, not well evaluated sonographically. Mildly increased renal cortical echogenicity suggesting medical renal disease. 8/23: Pt underwent hemodialysis session   Micro Data:  8/2: SARS-CoV-2 PCR> negative 8/2: Influenza PCR> negative 8/2: Blood culture x2> no growth 8/2: Urine Culture> 30,000 colonies yeast 8/2: Legionella urinary antigen> negative 8/10: 70,000 colonies yeast, ID  pend  Antimicrobials:  8/4 Fluconazole> increased to high dose 8/12> 8/4 Zosyn>8/13>restarted 08/20 due to fevers   HPI/INTERVAL CHANGES   REVIEW OF SYSTEMS PATIENT IS UNABLE TO PROVIDE COMPLETE REVIEW OF SYSTEMS DUE TO SEVERE CRITICAL ILLNESS AND TOXIC METABOLIC ENCEPHALOPATHY  PHYSICAL EXAMINATION: GENERAL: Chronically ill appearing female, NAD on Bipap  EYES: Pupils equal, round, reactive to light.  No scleral icterus.  MOUTH: Dry mucous membranes  PULMONARY: Diminished throughout, even, non labored  CARDIOVASCULAR: Sinus tach, no R/G, 2+ radial/2+ distal pulses present  GASTROINTESTINAL: mildly distended, Midline incision with dressing dry and intact. JP drain x 1 MUSCULOSKELETAL: trace pitting edema NEUROLOGIC: awake, follows commands, PERRL  SKIN:    OBJECTIVE  Blood pressure 135/64, pulse (!) 106, temperature 99.1 F (37.3 C), resp. rate 17, height 5' 2.01" (1.575 m), weight 125.9 kg, SpO2 100 %.    FiO2 (%):  [70 %-90 %] 70 %   Intake/Output Summary (Last 24 hours) at 07/18/2021 0746 Last data filed at 07/18/2021 0300 Gross per 24 hour  Intake 2109.97 ml  Output 2186 ml  Net -76.03 ml   Filed Weights   07/14/21 0500 07/17/21 0500 07/18/21 0500  Weight: 114 kg 125.5 kg 125.9 kg      Labs   CBC: Recent Labs  Lab 07/15/21 1036 07/16/21 0444 07/16/21 2217 07/17/21 0434 07/18/21 0511  WBC 5.2 6.4 6.3 7.1 7.9  NEUTROABS  --   --  4.5  --   --   HGB 8.5* 8.9* 7.9* 8.1* 7.9*  HCT 26.6* 28.8* 25.4* 26.7* 26.2*  MCV 87.5 88.6 89.1 87.5 87.3  PLT 267 328 328 346 094    Basic Metabolic Panel: Recent Labs  Lab 07/13/21 0435 07/14/21 0429 07/15/21 1036 07/16/21 0444 07/16/21 2217 07/17/21 0434 07/18/21 0511  NA 146*   < > 145 149* 147* 149* 144  K 3.5   < > 4.0 3.8 3.7 3.6 3.4*  CL 113*   < > 104 106 108 111 98  CO2 26   < > 28 28 30  31 32  GLUCOSE 235*   < > 208* 149* 124* 109* 232*  BUN 106*   < > 67* 79* 86* 83* 71*  CREATININE 2.45*   < > 1.97*  1.95* 2.08* 2.02* 1.82*  CALCIUM 8.3*   < > 8.4* 8.9 8.7* 8.8* 9.0  MG 2.0  --  1.9 2.0  --  2.0 1.9  PHOS 3.1  --  3.1 3.2  --  2.8 2.7   < > = values in this interval not displayed.   GFR: Estimated Creatinine Clearance: 34.4 mL/min (A) (by C-G formula based on SCr of 1.82 mg/dL (H)). Recent Labs  Lab 07/16/21 0444 07/16/21 2217 07/17/21 0026 07/17/21 0434 07/18/21 0511  PROCALCITON  --  1.54  --   --   --   WBC 6.4 6.3  --  7.1 7.9  LATICACIDVEN  --  1.9 1.7  --   --     Liver Function Tests: Recent Labs  Lab 07/13/21 0500 07/15/21 1036 07/16/21 2217 07/17/21 0434  AST 52* 59* 50* 52*  ALT 114* 139* 92* 88*  ALKPHOS 107 124 108 107  BILITOT 0.5 0.7 0.4 0.5  PROT 5.3* 5.8* 5.8* 6.0*  ALBUMIN 1.7* 1.7* 1.7* 1.8*   ABG    Component Value Date/Time   PHART 7.47 (H) 07/16/2021 1447   PCO2ART 42 07/16/2021 1447   PO2ART 71 (L) 07/16/2021 1447   HCO3 30.6 (H) 07/16/2021 1447   ACIDBASEDEF 6.7 (H) 07/06/2021 0951   O2SAT 95.1 07/16/2021 1447      Allergies Allergies  Allergen Reactions   Contrast Media  [Iodinated Diagnostic Agents] Anaphylaxis   Iodine Swelling    (IV only) - angioedema    Scheduled Meds:  amLODipine  10 mg Per Tube Daily   chlorhexidine  15 mL Mouth Rinse BID   chlorhexidine gluconate (MEDLINE KIT)  15 mL Mouth Rinse BID   Chlorhexidine Gluconate Cloth  6 each Topical Q0600   cloNIDine  0.2 mg Per Tube Daily   feeding supplement (PROSource TF)  45 mL Per Tube BID   free water  200 mL Per Tube Q4H   heparin injection (subcutaneous)  5,000 Units Subcutaneous Q8H   insulin aspart  0-20 Units Subcutaneous Q4H   insulin aspart  6 Units Subcutaneous Q4H   insulin detemir  20 Units Subcutaneous BID   mouth rinse  15 mL Mouth Rinse q12n4p   metoprolol tartrate  50 mg Per Tube BID   multivitamin  1 tablet Per Tube QHS   nutrition supplement (JUVEN)  1 packet Per Tube BID BM   potassium chloride  40 mEq Per Tube Once   Continuous Infusions:   dextrose 50 mL/hr at 07/17/21 1500   feeding supplement (NEPRO CARB STEADY) 1,000 mL (07/17/21 1848)   fluconazole (DIFLUCAN) IV Stopped (07/17/21 2300)   And   fluconazole (DIFLUCAN) IV Stopped (07/18/21 0630)   magnesium sulfate bolus IVPB     piperacillin-tazobactam (ZOSYN)  IV 3.375 g (07/18/21 0613)   PRN Meds:.acetaminophen (TYLENOL) oral liquid 160 mg/5 mL, heparin, hydrALAZINE, lidocaine (PF), lidocaine-prilocaine, ondansetron (ZOFRAN) IV, oxyCODONE, pentafluoroprop-tetrafluoroeth  Active Hospital Problem list   Acute hypoxic respiratory failure ventilator dependent Severe sepsis with septic shock, resolved Bowel perforation s/p ex-lap AKI on CKD stage IV > iHD Right UPJ stone with hydronephrosis s/p stent placement Left renal abscess Anion gap metabolic acidosis, resolved Lactic acidosis, resolved Diabetes mellitus A. fib flutter with RVR, resolved Candida glabrata pyelonephrosis Acute metabolic encephalopathy   Assessment &  Plan:   75 y.o. F Acute Hypoxic Respiratory Failure in setting of Community Acquired Pneumonia,  bilateral pleural effusions with associated atelectasis complicated by  perforated Duodenal Ulcer with peritonitis Status post laparotomy Status post ureteral stent placement Left renal abscess, appears to be resolving Candiduria  Failure to wean from vent -S/p exploratory laparotomy and primary repair of duodenal ulcer with omental patch on 06/29/21 -S/P bilateral ureteral stent placement 8/10 -Surgery and urology consulted appreciate input   Severe ACUTE Hypoxic Respiratory Failure suspect secondary to acute CHF exacerbation  -EXTUBATED 8/19 -Prn Bipap for dyspnea and/or hypoxia -Pt to undergo hemodialysis treatment 08/22  SOURCE-Severe Sepsis secondary to UTI, Community Acquired Pneumonia and perforated Duodenal Ulcer with peritonitis -ID following for antibiotic management~pt with recurrent fevers per ID US renal ordered to determine if abscess on  the left upper pole can be drained  -Fluconazole per ID  ACUTE SYSTOLIC CARDIAC FAILURE AFIB WITH RVR, resolved HYPERTENSION -Aim for daily negative fluid balance -Continue clonidine, amlodipine, increase metoprolol to 50 mg bid, and prn hydralazine for bp management   ACUTE KIDNEY INJURY/Renal Failure Hypokalemia and Hypomagnesia  Hypernatremia~resolved  -Continue Foley Catheter-assess need -Avoid nephrotoxic agents -Follow urine output, BMP -Replace electrolytes as indicated  -Ensure adequate renal perfusion, optimize oxygenation -Renal dose medications -Continue free water flush to 200 ml q4hrs and can consider discontinuing D5W -PermCath replaced 8/19; Nephrology consulted appreciate input~HD per recommendations   Acute metabolic encephalopathy~improving   -Avoid sedating medications when able   Intake/Output Summary (Last 24 hours) at 07/18/2021 0746 Last data filed at 07/18/2021 0300 Gross per 24 hour  Intake 2109.97 ml  Output 2186 ml  Net -76.03 ml    ACUTE ANEMIA- Transfuse for Hgb <7  Hyperglycemia  CBG's q4hrs SSI; scheduled novolog and levemir   Best practice:  Diet:  Tube Feed ;  Pain/Anxiety/Delirium protocol (if indicated): No VAP protocol (if indicated): Not indicated DVT prophylaxis: SQH GI prophylaxis: PPI Glucose control:  SSI Yes Central venous access:  left ij dual lumen permcath placed per vascular surgery 07/13/2021 Arterial line:  N/A Foley:  Yes, and it is still needed Mobility:  OOB with PT as tolerated  PT consulted: Yes Last date of multidisciplinary goals of care discussion [8/23] Code Status:  full code Disposition: Stepdown Unit     Critical Care Time: 30 minutes   Rosilyn Mings, [place] Pager [phone] (please enter 7 digits) PCCM Consult Pager [phone] (please enter 7 digits)

## 2021-07-18 NOTE — TOC Progression Note (Signed)
Transition of Care [facility]) - Progression Note    Patient Details  Name: WILLODENE STALLINGS MRN: [id number] Date of Birth: [birth year]  Transition of Care [facility] CM/SW [place], [place] Phone Number: [phone] 07/18/2021, 12:07 PM  Clinical Narrative:     CSW spoke with patient's Bagshaw,Steven C (Spouse) [phone] to discuss discharge planning.  Mr. Henkes stated he would not consider discharging to another facility until the patient was stable for "four or five days".  CSW mentioned our discussion did not require immediate decisions but I contacted him to begin the discussion, since it required some planning.  CSW explained the role of TOC inpatient care and the placement process for [facility] and an explanation of what LTACHs are.  Mr. Sedlar stated he would need to have a discussion with his daughter and requested I send him information on the LTACHs via email to [email or web address]  CSW sent Mr. [email or web address] information and asked Mr. Lesperance to contact me if he had any questions.  CSW did explain placement process and estimated timeline.  Mr. Vanbrocklin verbalized understanding.  Expected Discharge Plan: [place] Barriers to Discharge: Continued Medical Work up  Expected Discharge Plan and Services Expected Discharge Plan: [place] In-house Referral: Clinical Social Work   Post Acute Care Choice: [place] Living arrangements for the past 2 months: Single Family Home                                       Social Determinants of Health (SDOH) Interventions    Readmission Risk Interventions No flowsheet data found.

## 2021-07-18 NOTE — Progress Notes (Signed)
Patient ID: Heather Lopez, female   DOB: October 25, 1946, 75 y.o.   MRN: [id number] [facility] PROGRESS NOTE  Heather Lopez TSV:[id number] DOB: Mar 24, 1946 DOA: 06/27/2021 PCP: Heather Denis, PA-C  HPI/Subjective: Patient trying to talk but unable to vocalize much.  Able to wiggle her toes and squeeze my hands.  Mental status a little bit better today than yesterday.  Initially admitted on 06/27/2021 with sepsis, acute respiratory failure and acute renal failure on chronic kidney disease.  Objective: Vitals:   07/18/21 0904 07/18/21 1100  BP:  (!) 124/56  Pulse: (!) 106 99  Resp: (!) 23 (!) 24  Temp:  99.2 F (37.3 C)  SpO2: 99% 92%    Intake/Output Summary (Last 24 hours) at 07/18/2021 1235 Last data filed at 07/18/2021 1100 Gross per 24 hour  Intake 2728.97 ml  Output 3711 ml  Net -982.03 ml   Filed Weights   07/14/21 0500 07/17/21 0500 07/18/21 0500  Weight: 114 kg 125.5 kg 125.9 kg    ROS: Review of Systems  Unable to perform ROS: Acuity of condition  Exam: Physical Exam HENT:     Head: Normocephalic.     Mouth/Throat:     Mouth: Mucous membranes are dry.     Pharynx: No oropharyngeal exudate.     Comments: Some crusting on the tongue. Eyes:     General: Lids are normal.     Conjunctiva/sclera: Conjunctivae normal.  Cardiovascular:     Rate and Rhythm: Normal rate and regular rhythm.     Heart sounds: Normal heart sounds, S1 normal and S2 normal.  Pulmonary:     Breath sounds: Examination of the right-lower field reveals decreased breath sounds. Examination of the left-lower field reveals decreased breath sounds. Decreased breath sounds present. No wheezing, rhonchi or rales.  Abdominal:     Palpations: Abdomen is soft.     Tenderness: There is no abdominal tenderness.  Musculoskeletal:     Right lower leg: Swelling present.     Left lower leg: Swelling present.  Skin:    General: Skin is warm.     Findings: No rash.  Neurological:     Mental Status: She is  lethargic.       Scheduled Meds:  amLODipine  10 mg Per Tube Daily   chlorhexidine  15 mL Mouth Rinse BID   chlorhexidine gluconate (MEDLINE KIT)  15 mL Mouth Rinse BID   Chlorhexidine Gluconate Cloth  6 each Topical Q0600   cloNIDine  0.2 mg Per Tube Daily   [START ON 07/19/2021] epoetin (EPOGEN/PROCRIT) injection  10,000 Units Intravenous Q M,W,F-HD   feeding supplement (PROSource TF)  45 mL Per Tube BID   free water  200 mL Per Tube Q4H   heparin injection (subcutaneous)  5,000 Units Subcutaneous Q8H   insulin aspart  0-20 Units Subcutaneous Q4H   insulin aspart  6 Units Subcutaneous Q4H   insulin detemir  20 Units Subcutaneous BID   mouth rinse  15 mL Mouth Rinse q12n4p   metoprolol tartrate  50 mg Per Tube BID   multivitamin  1 tablet Per Tube QHS   nutrition supplement (JUVEN)  1 packet Per Tube BID BM   Continuous Infusions:  dextrose 50 mL/hr at 07/18/21 1100   feeding supplement (NEPRO CARB STEADY) 50 mL/hr at 07/18/21 1100   fluconazole (DIFLUCAN) IV Stopped (07/17/21 2300)   And   fluconazole (DIFLUCAN) IV Stopped (07/18/21 0630)   piperacillin-tazobactam (ZOSYN)  IV Stopped (07/18/21 1013)  Brief history.  Patient admitted on 06/27/2021 with weakness and found to have acute renal failure on chronic kidney disease stage III with hyperkalemia, acute respiratory failure with hypoxia on BiPAP, lactic acidosis and sepsis.  Past medical history of type 2 diabetes mellitus, hyperlipidemia, hypertension and depression and was scheduled to have hip surgery on the 24th. Hospital course.   8/2: Admitted to [facility] unit with sepsis 8/3: Vascular consulted for HD catheter 8/4: Patient developed acute abdomen.  CT abdomen shows viscus perforation.  Patient taken to the OR urgently for exploratory lap.  Patient return to ICU on vent.  PCCM consulted 07/01/21- patient had SBT today for recruitment and chest physiotherapy.  Still has significant tachycardia and hypertension when awake  (possible pain related) but mentation is good able to follow verbal communication.  07/02/21-patient has failed SBT today due to hypoxemia.  Reviewed care plan with surgery team.  07/03/21- Lung V/Q scan obtained ~ negative for PE.  Weaning FiO2, currently at 50% 8/9 severe resp failure, remains on vent 8/10 s/p Cystoscopy with Bilateral ureteral stent placement (3F/24 cm) for LEFT OBSTRUCTED KIDNEY STONE 8/11 remains on VENT 8/11 failed SAT/SBT  8/12 started on insulin drip for FSBS >400, developed A. fib with RVR CT abdomen pelvis showed decreasing size of left suprarenal collection 8/13 transitioned off insulin, rate relatively controlled 8/14 issues with increased heart rate when Cardizem decreased.  Currently at 10 mg/h plus Lopressor IV every 6H, consider transitioning Cardizem to via OG in a.m. 8/15 failing weaning trials 8/16 failed weaning trials discussed with family-likely needs [facility] 8/19 extubated to BiPAP 8/21 through 8/23.  Patient did spike some fevers.  Chest x-ray negative.  Ultrasound kidneys did not show a distinct abscess.  Mental status still very impaired.   Assessment/Plan:  Lethargy.  Could be secondary to infection.  Mental status little bit better today than yesterday after dialysis yesterday.  Pain medications made as needed instead of standing dose.  Fever infection likely also playing a role. Sepsis with septic shock, present on admission secondary to obstructive uropathy, kidney stones and renal abscesses.  Urology [facility] stent placements on 07/05/2021.  Ultrasound of the kidneys done yesterday did not show a discrete abscess.  Currently on high dose fluconazole for Candida in the urine cultures previously.  Repeat blood cultures negative on 07/15/2021.  Currently on Zosyn also.  White blood cell count normal range. Acute kidney injury on chronic kidney disease stage IV requiring dialysis during the hospital course.  Creatinine peaked at 9.44 and down to 1.82 today.  Had  dialysis yesterday Acute hypoxic respiratory failure.  Patient extubated 07/14/2021.  Currently on heated high flow nasal cannula 57% oxygen and 40 L. Essential hypertension and tachycardia on clonidine.  I will decrease the dose down to 0.1 mg daily, amlodipine and metoprolol. Duodenal ulcer status post patch repair by general surgery.  Add Pepcid at night Atrial flutter with rapid ventricular response on metoprolol Type 2 diabetes mellitus with chronic kidney disease stage IV on long-acting insulin twice a day and short acting insulin while on tube feeding Nutrition continue tube feeding Diarrhea has rectal tube and Stage II decubitus ulcer, present on admission see the full description below Anemia of chronic disease.  Last hemoglobin 7.9  Pressure Injury 07/07/21 Buttocks Stage 2 -  Partial thickness loss of dermis presenting as a shallow open injury with a red, pink wound bed without slough. (Active)  07/07/21 0800  Location: Buttocks  Location Orientation:   Staging: Stage 2 -  Partial thickness loss of dermis presenting as a shallow open injury with a red, pink wound bed without slough.  Wound Description (Comments):   Present on Admission: No       Code Status:     Code Status Orders  (From admission, onward)           Start     Ordered   07/12/21 1523  Do not attempt resuscitation (DNR)  Continuous       Question Answer Comment  In the event of cardiac or respiratory ARREST Do not call a "code blue"   In the event of cardiac or respiratory ARREST Do not perform Intubation, CPR, defibrillation or ACLS   In the event of cardiac or respiratory ARREST Use medication by any route, position, wound care, and other measures to relive pain and suffering. May use oxygen, suction and manual treatment of airway obstruction as needed for comfort.      07/12/21 1523           Code Status History     Date Active Date Inactive Code Status Order ID Comments User Context    06/27/2021 0445 07/12/2021 1523 Full Code [id number]  Rise Patience, MD ED      Advance Directive Documentation    Flowsheet Row Most Recent Value  Type of Advance Directive Healthcare Power of Attorney  Pre-existing out of facility DNR order (yellow form or pink MOST form) --  "MOST" Form in Place? --      Family Communication: Spoke with patient's husband on the phone Disposition Plan: Status is: Inpatient  Dispo: The patient is from: Home              Anticipated d/c is to: Likely rehab              Patient currently still requiring ICU stepdown setting.  Patient on heated high flow nasal cannula.  Still spiking some low-grade temperatures.  Not ready for disposition.   Difficult to place patient.  No.  Consultants: Critical care specialist Nephrology Infectious disease Urology  Procedures: Urological stenting procedure Duodenal ulcer repair Intubation and extubation Dialysis catheter placement  Antibiotics: Zosyn Diflucan  Time spent: 28 minutes  Heather Lopez [employer]

## 2021-07-19 DIAGNOSIS — N178 Other acute kidney failure: Secondary | ICD-10-CM

## 2021-07-19 DIAGNOSIS — N200 Calculus of kidney: Secondary | ICD-10-CM | POA: Diagnosis not present

## 2021-07-19 DIAGNOSIS — B379 Candidiasis, unspecified: Secondary | ICD-10-CM

## 2021-07-19 LAB — GLUCOSE, CAPILLARY
Glucose-Capillary: 198 mg/dL — ABNORMAL HIGH (ref 70–99)
Glucose-Capillary: 222 mg/dL — ABNORMAL HIGH (ref 70–99)
Glucose-Capillary: 230 mg/dL — ABNORMAL HIGH (ref 70–99)
Glucose-Capillary: 251 mg/dL — ABNORMAL HIGH (ref 70–99)
Glucose-Capillary: 269 mg/dL — ABNORMAL HIGH (ref 70–99)
Glucose-Capillary: 324 mg/dL — ABNORMAL HIGH (ref 70–99)

## 2021-07-19 LAB — BASIC METABOLIC PANEL
Anion gap: 11 (ref 5–15)
BUN: 87 mg/dL — ABNORMAL HIGH (ref 8–23)
CO2: 30 mmol/L (ref 22–32)
Calcium: 9.1 mg/dL (ref 8.9–10.3)
Chloride: 100 mmol/L (ref 98–111)
Creatinine, Ser: 2.08 mg/dL — ABNORMAL HIGH (ref 0.44–1.00)
GFR, Estimated: 25 mL/min — ABNORMAL LOW (ref >60)
Glucose, Bld: 251 mg/dL — ABNORMAL HIGH (ref 70–99)
Potassium: 3.2 mmol/L — ABNORMAL LOW (ref 3.5–5.1)
Sodium: 141 mmol/L (ref 135–145)

## 2021-07-19 LAB — CBC
HCT: 25.3 % — ABNORMAL LOW (ref 36.0–46.0)
Hemoglobin: 7.8 g/dL — ABNORMAL LOW (ref 12.0–15.0)
MCH: 26.5 pg (ref 26.0–34.0)
MCHC: 30.8 g/dL (ref 30.0–36.0)
MCV: 86.1 fL (ref 80.0–100.0)
Platelets: 358 10*3/uL (ref 150–400)
RBC: 2.94 MIL/uL — ABNORMAL LOW (ref 3.87–5.11)
RDW: 15.5 % (ref 11.5–15.5)
WBC: 7.8 10*3/uL (ref 4.0–10.5)
nRBC: 0 % (ref 0.0–0.2)

## 2021-07-19 LAB — MAGNESIUM: Magnesium: 2.2 mg/dL (ref 1.7–2.4)

## 2021-07-19 LAB — PHOSPHORUS: Phosphorus: 3.6 mg/dL (ref 2.5–4.6)

## 2021-07-19 MED ORDER — FLUCONAZOLE IN SODIUM CHLORIDE 400-0.9 MG/200ML-% IV SOLN
800.0000 mg | INTRAVENOUS | Status: DC
  Filled 2021-07-19: qty 400

## 2021-07-19 MED ORDER — INSULIN DETEMIR 100 UNIT/ML ~~LOC~~ SOLN
30.0000 [IU] | Freq: Two times a day (BID) | SUBCUTANEOUS | Status: DC
  Administered 2021-07-19 – 2021-08-03 (×28): 30 [IU] via SUBCUTANEOUS
  Filled 2021-07-19 (×32): qty 0.3

## 2021-07-19 MED ORDER — POTASSIUM CHLORIDE 20 MEQ PO PACK
40.0000 meq | PACK | Freq: Once | ORAL | Status: AC
  Administered 2021-07-19: 40 meq
  Filled 2021-07-19: qty 2

## 2021-07-19 MED ORDER — FAMOTIDINE 40 MG/5ML PO SUSR
20.0000 mg | Freq: Every day | ORAL | Status: DC
  Administered 2021-07-19 – 2021-07-23 (×5): 20 mg
  Filled 2021-07-19 (×6): qty 2.5

## 2021-07-19 MED ORDER — FLUCONAZOLE IN SODIUM CHLORIDE 400-0.9 MG/200ML-% IV SOLN
800.0000 mg | INTRAVENOUS | Status: DC
  Administered 2021-07-19 – 2021-07-20 (×2): 800 mg via INTRAVENOUS
  Filled 2021-07-19 (×2): qty 400

## 2021-07-19 MED ORDER — HEPARIN SODIUM (PORCINE) 1000 UNIT/ML IJ SOLN
INTRAMUSCULAR | Status: AC
  Filled 2021-07-19: qty 1

## 2021-07-19 MED ORDER — EPOETIN ALFA 10000 UNIT/ML IJ SOLN
INTRAMUSCULAR | Status: AC
  Filled 2021-07-19: qty 1

## 2021-07-19 NOTE — Progress Notes (Signed)
Patient completed treatment as ordered. Patient tolerated well. Report given to floor nurse Melvyn Neth, RN.

## 2021-07-19 NOTE — Progress Notes (Addendum)
Patient seen and evaluated with Mr. Heather Lopez and agree with his note.  Patient more awake today, though unable to talk or fully communicate.  However, she does open her eyes better and can nod her head.  Tolerating tube feeds, no fevers in last 24 hrs.  Remaining drain in RUQ removed today without complications.  Midline incision clean, dry, without infection, with only one small opening of 1.5-2 cm in the upper portion, shallow.  From surgery standpoint, continue tube feeds, working with speech therapy and following their recs for po intake and diet transitioning.  Will sign off for now but we're available if needed.  Heather Ree, MD     [place] [facility] Day(s): 22.   Post op day(s): 18 Days Post-Op.   Interval History:  Patient seen and examined No fevers in last 24 hours Patient much more alert today; trying to communicate but difficulty with speech She denied any abdominal pain She remains without leukocytosis; WBC 7.8K Hgb stable at 7.8 Renal function stable; sCr - 2.08; UO - 2.8L Mild hypokalemia to 3.2 Surgical drains:             - Right mid abdomen (draining repair site); 0 ccs; serous Tube feedings advancing; 50 ccs/hr; at goal  She is having bowel function; multiple large loose stools daily; rectal tube in place; 950 ccs out Pending SLP evaluation   Vital signs in last 24 hours: [min-max] current  Temp:  [99.1 F (37.3 C)-99.8 F (37.7 C)] 99.6 F (37.6 C) (08/24 0100) Pulse Rate:  [86-108] 88 (08/24 0700) Resp:  [18-34] 23 (08/24 0700) BP: (124-161)/(50-74) 143/50 (08/24 0700) SpO2:  [89 %-100 %] 97 % (08/24 0700) FiO2 (%):  [50 %-60 %] 50 % (08/24 0546)     Height: 5' 2.01" (157.5 cm) Weight: 125.9 kg BMI (Calculated): 50.75   Intake/Output last 2 shifts:  08/23 0701 - 08/24 0700 In: 3481.1 [I.V.:1144.1; NG/GT:1630; IV Piggyback:606.9] Out: 3800 [Urine:2850; Stool:950]   Physical Exam:  Constitutional: alert,  awake, attempting to communicate but having trouble with speech  HEENT: NGT in place; receiving tube feedings Respiratory:on HFNC Cardiovascular: regular rate, sinus rhythm Gastrointestinal: Soft, non-tender, non-distended, surgical drain in right mid-abdomen, output serous, rectal tube in place  Genitourinary: Foley in place  Integumentary: Midline incision is well healed; there is a small area where the skin is open in the mid-upper portion of the incision  Labs:  CBC Latest Ref Rng & Units 07/19/2021 07/18/2021 07/17/2021  WBC 4.0 - 10.5 K/uL 7.8 7.9 7.1  Hemoglobin 12.0 - 15.0 g/dL 7.8(L) 7.9(L) 8.1(L)  Hematocrit 36.0 - 46.0 % 25.3(L) 26.2(L) 26.7(L)  Platelets 150 - 400 K/uL 358 379 346   CMP Latest Ref Rng & Units 07/19/2021 07/18/2021 07/17/2021  Glucose 70 - 99 mg/dL 251(H) 232(H) 109(H)  BUN 8 - 23 mg/dL 87(H) 71(H) 83(H)  Creatinine 0.44 - 1.00 mg/dL 2.08(H) 1.82(H) 2.02(H)  Sodium 135 - 145 mmol/L 141 144 149(H)  Potassium 3.5 - 5.1 mmol/L 3.2(L) 3.4(L) 3.6  Chloride 98 - 111 mmol/L 100 98 111  CO2 22 - 32 mmol/L 30 32 31  Calcium 8.9 - 10.3 mg/dL 9.1 9.0 8.8(L)  Total Protein 6.5 - 8.1 g/dL - - 6.0(L)  Total Bilirubin 0.3 - 1.2 mg/dL - - 0.5  Alkaline Phos 38 - 126 U/L - - 107  AST 15 - 41 U/L - - 52(H)  ALT 0 - 44 U/L - - 88(H)  Imaging studies: No new pertinent imaging studies   Assessment/Plan: 75 y.o. female 18 days s/p exploratory laparotomy and primary repair of duodenal ulcer with omental patch   - Continue tube feedings; at goal. Would defer PO intake until more alert and able to participate; agree with SLP evaluation prior to starting this  - Okay to continue stool management device as needed  - Monitor abdominal examination; on-going bowel function - Removed remaining surgical drain; dressing placed; remain in place x48 hours and change prn - Further management per primary and consulting services; we will follow peripherally. Please call with  questions/concerns  All of the above findings and recommendations were discussed with the medical team.   -- Edison Simon, PA-C [facility] 07/19/2021, 7:34 AM [phone] M-F: 7am - 4pm

## 2021-07-19 NOTE — Progress Notes (Signed)
Patient ID: [name], female   DOB: 1946/06/06, 75 y.o.   MRN: [id number]    Progress Note from the Palliative Medicine Team at [facility]   Patient Name: Heather Lopez        Date: 07/19/2021 DOB: June 21, 1946  Age: 75 y.o. MRN#: [id number] Attending Physician: Sharen Hones, MD Primary Care Physician: [name], PA-C Admit Date: 06/27/2021   Medical records reviewed   [name] is a 75 y.o. female with history of hypertension, diabetes mellitus, hyperlipidemia has been feeling weak for the last 3 to 4 days.  Patient had a acute renal failure with creatinine of 9.3, potassium 7.5, acute metabolic acidosis with pH 7.15, procalcitonin 6.83.  Patient was diagnosed was septic shock, acute respiratory failure. On night of 8/4, patient developed acute abdominal pain, CT scan showed acute abdomen secondary to perforated duodenum.  Patient was placed on mechanical ventilation after duodenum repair. Patient had a bilateral hydronephrosis, ureteral stents was placed on 8/10. Patient urine culture was positive for Candida glabrata.  ID consult was obtained, patient currently on high-dose of Diflucan.  Infectious disease and nephrology on board.    Patient remains in the ICU, today is day 2 of this hospital stay.   Patient remains obtunded, she is unable to follow commands, she continues with core track for nutrition and hydration support, she is  critically ill and appears generally uncomfortable.  This NP visited patient at the bedside as a follow up for palliative medicine needs and emotional support.  Husband at bedside.  Continue conversation regarding current medical situation.  Education offered regarding the seriousness of the patient's current medical situation and her high risk for decompensation.  We discussed limitations of medical interventions to prolong quality of life, when the body fails to thrive.     Education offered on the importance of conversation, consideration and  documentation of advance care planning wishes.  Patient has been asks that we not continue today's conversation, he only wants to speak to positive feelings.  He remains hopeful for his wife's improvement and ultimately return home.  Emotional support offered.   PMT will continue to support holistically.  Questions and concerns addressed   Discussed with attedning  Total time spent on the unit was 35 minutes  Greater than 50% of the time was spent in counseling and coordination of care  Wadie Lessen NP  Palliative Medicine Team Team Phone # [phone] Pager [phone]

## 2021-07-19 NOTE — Progress Notes (Addendum)
Inpatient Diabetes Program Recommendations  AACE/ADA: New Consensus Statement on Inpatient Glycemic Control (2015)  Target Ranges:  Prepandial:   less than 140 mg/dL      Peak postprandial:   less than 180 mg/dL (1-2 hours)      Critically ill patients:  140 - 180 mg/dL   Lab Results  Component Value Date   GLUCAP 198 (H) 07/19/2021   HGBA1C 8.5 (H) 07/09/2021    Review of Glycemic Control Results for Heather Lopez, Heather Lopez (MRN [id number]) as of 07/19/2021 13:33  Ref. Range 07/18/2021 23:26 07/19/2021 03:45 07/19/2021 07:27 07/19/2021 11:26  Glucose-Capillary Latest Ref Range: 70 - 99 mg/dL 217 (H) 230 (H) 251 (H) 198 (H)  Home DM Meds: Novolog 6 units TID with meals + Correction                             Levemir 52 units BID                             Metformin 1000 mg BID                             Trulicity 9.62 mg Qweek Current orders for Inpatient glycemic control:  Novolog resistant q 4 hours Novolog 6 units q 4 hours Levemir 20 units bid Nepro 50 cc/hr  Inpatient Diabetes Program Recommendations:    Consider increasing Levemir up too 30 units bid.   Thanks,  Adah Perl, RN, BC-ADM Inpatient Diabetes Coordinator Pager [phone]  (8a-5p)

## 2021-07-19 NOTE — Progress Notes (Signed)
PROGRESS NOTE    Heather Lopez  XKG:[id number] DOB: 01-04-1946 DOA: 06/27/2021 PCP: [name], PA-C   Chief complaint.  Altered mental status. Brief Narrative:  Heather Lopez is a 75 y.o. female with history of hypertension, diabetes mellitus, hyperlipidemia has been feeling weak for the last 3 to 4 days.  Patient had a acute renal failure with creatinine of 9.3, potassium 7.5, acute metabolic acidosis with pH 7.15, procalcitonin 6.83.  Patient was diagnosed was septic shock, acute respiratory failure. On night of 8/4, patient developed acute abdominal pain, CT scan showed acute abdomen secondary to perforated duodenum.  Patient was placed on mechanical ventilation after surgery. Patient had a bilateral hydronephrosis, ureteral stents was placed on 8/10. Patient urine culture was positive for Candida glabrata.  ID consult was obtained, patient currently on high-dose of Diflucan.   Assessment & Plan:   Active Problems:   Uncontrolled type 2 diabetes mellitus with insulin therapy ([place])   Hyperlipidemia associated with type 2 diabetes mellitus ([place])   Type 2 diabetes mellitus with stage 4 chronic kidney disease, with long-term current use of insulin ([facility])   Acute respiratory failure with hypoxia ([facility])   Severe sepsis with septic shock (HCC)   Acute lower UTI   Acute kidney injury superimposed on chronic kidney disease (HCC)   Pneumoperitoneum   Pressure injury of skin   Atrial flutter with rapid ventricular response ([place])   Endotracheally intubated   Bilateral nephrolithiasis   Anemia of chronic disease   Demand ischemia of myocardium (HCC)   Lethargy   Duodenal ulcer perforation ([place])   Renal abscess   Essential hypertension  #1.  Acute metabolic encephalopathy. Patient currently still sleepy, but started waking up since yesterday.   She is still n.p.o. and receiving tube feeding.  We will continue to follow.  2.  Sepsis with septic shock secondary to obstructive uropathy, kidney  stone. Candida urinary tract infection Condition had improved, urine culture grow Candida glabrata. Appreciate ID consult. Continue high-dose Diflucan Patient is status post bilateral ureteral stent.  #3.  Perforated duodenum. S/p repair. Patient has been tolerating tube feeding, had already amount of loose stools.  Bowel has recovered.  Currently patient has a rectal tube in place. Patient currently still receiving tube feeding, therapy will evaluate the patient tomorrow to decide on a diet. Still on Zosyn due to fever 2 days ago.  #4.  Acute hypoxemic respiratory failure secondary to septic shock. Patient still on heated high flow, but condition gradually improving.  Continue to wean oxygen.  Due to hypoxemia, I will keep patient in stepdown unit for now.  5.  Acute kidney injury on chronic kidney disease stage IV. Patient has been requiring hemodialysis.  Appreciate nephrology consult.  6.  Atrial flutter with rapid ventricular response. Condition had improved.  7.  Uncontrolled type 2 diabetes with hyperglycemia Glucose running high, increase Levemir dose.  #8.  Morbid obesity. Obesity hypoventilation syndrome. To follow.  #9.  Hypokalemia. Repleted.     DVT prophylaxis: Heparin Code Status: DNR Family Communication: Husband at the bedside Disposition Plan:    Status is: Inpatient  Remains inpatient appropriate because:IV treatments appropriate due to intensity of illness or inability to take PO and Inpatient level of care appropriate due to severity of illness  Dispo: The patient is from: Home              Anticipated d/c is to:  To be determined  Patient currently is not medically stable to d/c.   Difficult to place patient No        I/O last 3 completed shifts: In: 5129.4 [I.V.:1544; Other:150; NG/GT:2379.2; IV Piggyback:1056.3] Out: 4750 [Urine:3800; Stool:950] Total I/O In: 750.7 [I.V.:247.5; Other:100; NG/GT:360; IV  Piggyback:43.3] Out: 255 [Urine:250; Drains:5]     Consultants:  ID  Procedures: Duodenal repair, bilateral ureteral stents  Antimicrobials: Zosyn and Diflucan  Subjective: Patient doing well today, she woke up yesterday.  She is still n.p.o. and receiving tube feeding. She still on high flow oxygen, short of breath with exertion. She is making urine, no dysuria hematuria pain No fever chills  No chest pain or palpitation.  Objective: Vitals:   07/19/21 1115 07/19/21 1130 07/19/21 1145 07/19/21 1200  BP: (!) 155/56 (!) 166/59 (!) 159/59 (!) 152/62  Pulse: (!) 106 (!) 106 (!) 104 (!) 104  Resp: (!) 28 (!) 36 (!) 25 (!) 34  Temp:    98.3 F (36.8 C)  TempSrc:    Oral  SpO2: 93%  99% 96%  Weight:      Height:        Intake/Output Summary (Last 24 hours) at 07/19/2021 1441 Last data filed at 07/19/2021 1258 Gross per 24 hour  Intake 3240.24 ml  Output 2530 ml  Net 710.24 ml   Filed Weights   07/14/21 0500 07/17/21 0500 07/18/21 0500  Weight: 114 kg 125.5 kg 125.9 kg    Examination:  General exam: Appears calm and comfortable, morbid obese. Respiratory system: Clear to auscultation. Respiratory effort normal. Cardiovascular system: S1 & S2 heard, RRR. No JVD, murmurs, rubs, gallops or clicks. No pedal edema. Gastrointestinal system: Abdomen is nondistended, soft and nontender. No organomegaly or masses felt. Normal bowel sounds heard. Central nervous system: Alert and oriented x2. No focal neurological deficits. Extremities: Symmetric 5 x 5 power. Skin: No rashes, lesions or ulcers Psychiatry: Mood & affect appropriate.     Data Reviewed: I have personally reviewed following labs and imaging studies  CBC: Recent Labs  Lab 07/16/21 0444 07/16/21 2217 07/17/21 0434 07/18/21 0511 07/19/21 0449  WBC 6.4 6.3 7.1 7.9 7.8  NEUTROABS  --  4.5  --   --   --   HGB 8.9* 7.9* 8.1* 7.9* 7.8*  HCT 28.8* 25.4* 26.7* 26.2* 25.3*  MCV 88.6 89.1 87.5 87.3 86.1  PLT 328  328 346 379 035   Basic Metabolic Panel: Recent Labs  Lab 07/15/21 1036 07/16/21 0444 07/16/21 2217 07/17/21 0434 07/18/21 0511 07/19/21 0449  NA 145 149* 147* 149* 144 141  K 4.0 3.8 3.7 3.6 3.4* 3.2*  CL 104 106 108 111 98 100  CO2 28 28 30 31  32 30  GLUCOSE 208* 149* 124* 109* 232* 251*  BUN 67* 79* 86* 83* 71* 87*  CREATININE 1.97* 1.95* 2.08* 2.02* 1.82* 2.08*  CALCIUM 8.4* 8.9 8.7* 8.8* 9.0 9.1  MG 1.9 2.0  --  2.0 1.9 2.2  PHOS 3.1 3.2  --  2.8 2.7 3.6   GFR: Estimated Creatinine Clearance: 30.1 mL/min (A) (by C-G formula based on SCr of 2.08 mg/dL (H)). Liver Function Tests: Recent Labs  Lab 07/13/21 0500 07/15/21 1036 07/16/21 2217 07/17/21 0434  AST 52* 59* 50* 52*  ALT 114* 139* 92* 88*  ALKPHOS 107 124 108 107  BILITOT 0.5 0.7 0.4 0.5  PROT 5.3* 5.8* 5.8* 6.0*  ALBUMIN 1.7* 1.7* 1.7* 1.8*   No results for input(s): LIPASE, AMYLASE in the last 168 hours.  No results for input(s): AMMONIA in the last 168 hours. Coagulation Profile: Recent Labs  Lab 07/16/21 2217  INR 1.2   Cardiac Enzymes: No results for input(s): CKTOTAL, CKMB, CKMBINDEX, TROPONINI in the last 168 hours. BNP (last 3 results) No results for input(s): PROBNP in the last 8760 hours. HbA1C: No results for input(s): HGBA1C in the last 72 hours. CBG: Recent Labs  Lab 07/18/21 1926 07/18/21 2326 07/19/21 0345 07/19/21 0727 07/19/21 1126  GLUCAP 228* 217* 230* 251* 198*   Lipid Profile: No results for input(s): CHOL, HDL, LDLCALC, TRIG, CHOLHDL, LDLDIRECT in the last 72 hours. Thyroid Function Tests: No results for input(s): TSH, T4TOTAL, FREET4, T3FREE, THYROIDAB in the last 72 hours. Anemia Panel: No results for input(s): VITAMINB12, FOLATE, FERRITIN, TIBC, IRON, RETICCTPCT in the last 72 hours. Sepsis Labs: Recent Labs  Lab 07/16/21 2217 07/17/21 0026 07/18/21 0511  PROCALCITON 1.54  --  1.88  LATICACIDVEN 1.9 1.7  --     Recent Results (from the past 240 hour(s))   CULTURE, BLOOD (ROUTINE X 2) w Reflex to ID Panel     Status: None (Preliminary result)   Collection Time: 07/15/21  7:57 PM   Specimen: BLOOD  Result Value Ref Range Status   Specimen Description BLOOD LEFT ASSIST CONTROL  Final   Special Requests   Final    BOTTLES DRAWN AEROBIC AND ANAEROBIC Blood Culture adequate volume   Culture   Final    NO GROWTH 4 DAYS Performed at [facility], [address], [place], [place] [address]    Report Status PENDING  Incomplete  CULTURE, BLOOD (ROUTINE X 2) w Reflex to ID Panel     Status: None (Preliminary result)   Collection Time: 07/15/21  8:07 PM   Specimen: BLOOD  Result Value Ref Range Status   Specimen Description BLOOD LEFT FOREARM  Final   Special Requests   Final    BOTTLES DRAWN AEROBIC AND ANAEROBIC Blood Culture adequate volume   Culture   Final    NO GROWTH 4 DAYS Performed at [facility], [address], [place], [place] [address]    Report Status PENDING  Incomplete         Radiology Studies: US RENAL  Result Date: 07/17/2021 CLINICAL DATA:  Renal abscess EXAM: RENAL / URINARY TRACT ULTRASOUND COMPLETE COMPARISON:  07/15/2021 FINDINGS: Right Kidney: Renal measurements: 13.7 x 6.3 x 6.3 cm = volume: 280 mL. Mildly increased renal cortical echogenicity. 1.6 cm echogenic, shadowing stone within the lower pole of the right kidney. No mass or hydronephrosis visualized. Left Kidney: Renal measurements: 12.6 x 6.8 x 6.1 cm = volume: 272 mL. Mildly increased renal cortical echogenicity. Multiple shadowing stones within the left kidney, largest measuring up to 1.2 cm. No mass or hydronephrosis visualized. Bladder: Appears normal for degree of bladder distention. Other: A small amount of free fluid is seen adjacent to both kidneys. Technical note: Technically limited examination secondary to poor penetration related to patient body habitus and difficulties with patient positioning as patient was unable to roll.  IMPRESSION: 1. Limited exam. 2. Bilateral nonobstructing renal calculi. 3. Small amount of free fluid is seen adjacent to both kidneys, not well evaluated sonographically. 4. Mildly increased renal cortical echogenicity suggesting medical renal disease. Electronically Signed   By: Davina Poke D.O.   On: 07/17/2021 19:00   DG Chest Port 1 View  Result Date: 07/18/2021 CLINICAL DATA:  75 year old female with respiratory distress, shortness of breath. EXAM: PORTABLE CHEST 1  VIEW COMPARISON:  07/16/2021 and earlier. FINDINGS: Portable AP semi upright view at 0939 hours. No endotracheal tube identified. Enteric tube courses to the left abdomen, tip not included. Stable left chest dual lumen dialysis type catheter. Mildly improved lung volumes and mediastinal contours. No pneumothorax, pulmonary edema, pleural effusion or consolidation. Mild streaky opacity at both lung bases most resembles atelectasis, also improved. Paucity of bowel gas in the upper abdomen. Chronic right midshaft clavicle deformity. No acute osseous abnormality identified. IMPRESSION: Larger lung volumes with regressed but not resolved lung base atelectasis. No new cardiopulmonary abnormality. Electronically Signed   By: Genevie Meily M.D.   On: 07/18/2021 10:25        Scheduled Meds:  amLODipine  10 mg Per Tube Daily   chlorhexidine  15 mL Mouth Rinse BID   Chlorhexidine Gluconate Cloth  6 each Topical Q0600   cloNIDine  0.1 mg Per Tube Daily   epoetin alfa       epoetin (EPOGEN/PROCRIT) injection  10,000 Units Intravenous Q M,W,F-HD   famotidine  20 mg Per Tube QHS   feeding supplement (PROSource TF)  45 mL Per Tube BID   free water  200 mL Per Tube Q4H   heparin injection (subcutaneous)  5,000 Units Subcutaneous Q8H   heparin sodium (porcine)       insulin aspart  0-20 Units Subcutaneous Q4H   insulin aspart  6 Units Subcutaneous Q4H   insulin detemir  30 Units Subcutaneous BID   mouth rinse  15 mL Mouth Rinse q12n4p    metoprolol tartrate  50 mg Per Tube BID   multivitamin  1 tablet Per Tube QHS   nutrition supplement (JUVEN)  1 packet Per Tube BID BM   Continuous Infusions:  feeding supplement (NEPRO CARB STEADY) 50 mL/hr at 07/19/21 1100   fluconazole (DIFLUCAN) IV     piperacillin-tazobactam (ZOSYN)  IV Stopped (07/19/21 0927)     LOS: 22 days    Time spent: 32 minutes, more than 50% time spent on direct patient care.    Sharen Hones, MD [facility]   To contact the attending provider between 7A-7P or the covering provider during after hours 7P-7A, please log into the web site [email or web address] and access using universal [place] password for that web site. If you do not have the password, please call the hospital operator.  07/19/2021, 2:41 PM

## 2021-07-19 NOTE — Progress Notes (Signed)
[facility]  ROUNDING NOTE   Subjective:   Heather Lopez is a 75 y.o. white female who has been admitted for community acquired pneumonia.    Hospital course complicated by duodenal perforation s/p repair on 06/30/2021 ARF - requiring HD as needed. Last hemodialysis was 8/18.  Acute resp failure requiring mech ventilation Left obstructive hydronephrosis and renal abscess, Patient underwent cystoscopy and b/l ureteral stent placement 8/10  Seen and examined on hemodialysis treatment.     HEMODIALYSIS FLOWSHEET:  Blood Flow Rate (mL/min): 400 mL/min Arterial Pressure (mmHg): -210 mmHg Venous Pressure (mmHg): 160 mmHg Transmembrane Pressure (mmHg): 50 mmHg Ultrafiltration Rate (mL/min): 220 mL/min Dialysate Flow Rate (mL/min): 500 ml/min Conductivity: Machine : 13.8 Conductivity: Machine : 13.8 Dialysis Fluid Bolus: Normal Saline Bolus Amount (mL): 300 mL Dialysate Change:  (3k)   Afebrile   Objective:  Vital signs in last 24 hours:  Temp:  [98.3 F (36.8 C)-99.8 F (37.7 C)] 98.3 F (36.8 C) (08/24 1200) Pulse Rate:  [86-109] 104 (08/24 1200) Resp:  [18-36] 34 (08/24 1200) BP: (133-166)/(50-63) 152/62 (08/24 1200) SpO2:  [89 %-100 %] 96 % (08/24 1200) FiO2 (%):  [50 %-55 %] 52 % (08/24 0813)  Weight change:  Filed Weights   07/14/21 0500 07/17/21 0500 07/18/21 0500  Weight: 114 kg 125.5 kg 125.9 kg    Intake/Output: I/O last 3 completed shifts: In: 5129.4 [I.V.:1544; Other:150; NG/GT:2379.2; IV Piggyback:1056.3] Out: 4750 [Urine:3800; Stool:950]   Intake/Output this shift:  Total I/O In: 650.7 [I.V.:247.5; NG/GT:360; IV Piggyback:43.3] Out: 255 [Urine:250; Drains:5]  Physical Exam: General: Critically ill  Head: [place]/AT  Eyes: Anicteric  Lungs:  +coarse breath sounds  Heart: tachycardia  Abdomen:  +JP drains, midline incision  Extremities:  trace peripheral edema   Neurologic:  Answering questions and Following commands  Skin: No lesions  GU  Foley with yellow urine  Access:  LIJ permcath 9/38    Basic Metabolic Panel: Recent Labs  Lab 07/15/21 1036 07/16/21 0444 07/16/21 2217 07/17/21 0434 07/18/21 0511 07/19/21 0449  NA 145 149* 147* 149* 144 141  K 4.0 3.8 3.7 3.6 3.4* 3.2*  CL 104 106 108 111 98 100  CO2 28 28 30 31  32 30  GLUCOSE 208* 149* 124* 109* 232* 251*  BUN 67* 79* 86* 83* 71* 87*  CREATININE 1.97* 1.95* 2.08* 2.02* 1.82* 2.08*  CALCIUM 8.4* 8.9 8.7* 8.8* 9.0 9.1  MG 1.9 2.0  --  2.0 1.9 2.2  PHOS 3.1 3.2  --  2.8 2.7 3.6     Liver Function Tests: Recent Labs  Lab 07/13/21 0500 07/15/21 1036 07/16/21 2217 07/17/21 0434  AST 52* 59* 50* 52*  ALT 114* 139* 92* 88*  ALKPHOS 107 124 108 107  BILITOT 0.5 0.7 0.4 0.5  PROT 5.3* 5.8* 5.8* 6.0*  ALBUMIN 1.7* 1.7* 1.7* 1.8*    No results for input(s): LIPASE, AMYLASE in the last 168 hours. No results for input(s): AMMONIA in the last 168 hours.  CBC: Recent Labs  Lab 07/16/21 0444 07/16/21 2217 07/17/21 0434 07/18/21 0511 07/19/21 0449  WBC 6.4 6.3 7.1 7.9 7.8  NEUTROABS  --  4.5  --   --   --   HGB 8.9* 7.9* 8.1* 7.9* 7.8*  HCT 28.8* 25.4* 26.7* 26.2* 25.3*  MCV 88.6 89.1 87.5 87.3 86.1  PLT 328 328 346 379 358     Cardiac Enzymes: No results for input(s): CKTOTAL, CKMB, CKMBINDEX, TROPONINI in the last 168 hours.  BNP: Invalid  input(s): POCBNP  CBG: Recent Labs  Lab 07/18/21 1926 07/18/21 2326 07/19/21 0345 07/19/21 0727 07/19/21 1126  GLUCAP 228* 217* 230* 251* 198*     Microbiology: Results for orders placed or performed during the hospital encounter of 06/27/21  Resp Panel by RT-PCR (Flu A&B, Covid) Nasopharyngeal Swab     Status: None   Collection Time: 06/27/21 12:32 AM   Specimen: Nasopharyngeal Swab; Nasopharyngeal(NP) swabs in vial transport medium  Result Value Ref Range Status   SARS Coronavirus 2 by RT PCR NEGATIVE NEGATIVE Final    Comment: (NOTE) SARS-CoV-2 target nucleic acids are NOT  DETECTED.  The SARS-CoV-2 RNA is generally detectable in upper respiratory specimens during the acute phase of infection. The lowest concentration of SARS-CoV-2 viral copies this assay can detect is 138 copies/mL. A negative result does not preclude SARS-Cov-2 infection and should not be used as the sole basis for treatment or other patient management decisions. A negative result may occur with  improper specimen collection/handling, submission of specimen other than nasopharyngeal swab, presence of viral mutation(s) within the areas targeted by this assay, and inadequate number of viral copies(<138 copies/mL). A negative result must be combined with clinical observations, patient history, and epidemiological information. The expected result is Negative.  Fact Sheet for Patients:  EntrepreneurPulse.com.au  Fact Sheet for Healthcare Providers:  IncredibleEmployment.be  This test is no t yet approved or cleared by the [place] FDA and  has been authorized for detection and/or diagnosis of SARS-CoV-2 by FDA under an Emergency Use Authorization (EUA). This EUA will remain  in effect (meaning this test can be used) for the duration of the COVID-19 declaration under Section 564(b)(1) of the Act, 21 U.S.C.section 360bbb-3(b)(1), unless the authorization is terminated  or revoked sooner.       Influenza A by PCR NEGATIVE NEGATIVE Final   Influenza B by PCR NEGATIVE NEGATIVE Final    Comment: (NOTE) The Xpert Xpress SARS-CoV-2/FLU/RSV plus assay is intended as an aid in the diagnosis of influenza from Nasopharyngeal swab specimens and should not be used as a sole basis for treatment. Nasal washings and aspirates are unacceptable for Xpert Xpress SARS-CoV-2/FLU/RSV testing.  Fact Sheet for Patients: EntrepreneurPulse.com.au  Fact Sheet for Healthcare Providers: IncredibleEmployment.be  This test is not yet  approved or cleared by the [place] FDA and has been authorized for detection and/or diagnosis of SARS-CoV-2 by FDA under an Emergency Use Authorization (EUA). This EUA will remain in effect (meaning this test can be used) for the duration of the COVID-19 declaration under Section 564(b)(1) of the Act, 21 U.S.C. section 360bbb-3(b)(1), unless the authorization is terminated or revoked.  Performed at [facility], [address] [place], [place] [address]   Blood culture (routine x 2)     Status: None   Collection Time: 06/27/21 12:43 AM   Specimen: BLOOD  Result Value Ref Range Status   Specimen Description BLOOD RIGHT ASSIST CONTROL  Final   Special Requests   Final    BOTTLES DRAWN AEROBIC AND ANAEROBIC Blood Culture results may not be optimal due to an inadequate volume of blood received in culture bottles   Culture   Final    NO GROWTH 5 DAYS Performed at [facility], [address], [place], [place] [address]    Report Status 07/02/2021 FINAL  Final  Blood culture (routine x 2)     Status: None   Collection Time: 06/27/21  1:45 AM   Specimen: BLOOD  Result Value  Ref Range Status   Specimen Description BLOOD RIGHT ASSIST CONTROL  Final   Special Requests   Final    BOTTLES DRAWN AEROBIC AND ANAEROBIC Blood Culture adequate volume   Culture   Final    NO GROWTH 5 DAYS Performed at [facility], [address], [place], [place] [address]    Report Status 07/02/2021 FINAL  Final  Urine Culture     Status: Abnormal   Collection Time: 06/27/21  2:39 AM   Specimen: In/Out Cath Urine  Result Value Ref Range Status   Specimen Description   Final    IN/OUT CATH URINE Performed at [facility], [address], [place], [place] [address]    Special Requests   Final    NONE Performed at [facility], [address] [place], [place] [address]    Culture 30,000 COLONIES/mL YEAST (A)  Final   Report Status 06/28/2021 FINAL   Final  MRSA Next Gen by PCR, Nasal     Status: None   Collection Time: 06/30/21  5:01 AM   Specimen: Nasal Mucosa; Nasal Swab  Result Value Ref Range Status   MRSA by PCR Next Gen NOT DETECTED NOT DETECTED Final    Comment: (NOTE) The GeneXpert MRSA Assay (FDA approved for NASAL specimens only), is one component of a comprehensive MRSA colonization surveillance program. It is not intended to diagnose MRSA infection nor to guide or monitor treatment for MRSA infections. Test performance is not FDA approved in patients less than 66 years old. Performed at [facility], [address], [place], [place] [address]   Urine Culture     Status: Abnormal   Collection Time: 07/05/21 10:36 AM   Specimen: PATH Other; Urine  Result Value Ref Range Status   Specimen Description   Final    URINE, RANDOM LEFT RENAL PELVIS URINE Performed at [facility], [address] [place], [place] [address]    Special Requests   Final    NONE Performed at [facility], [address] [place], [place] [address]    Culture 70,000 [place] (A)  Final   Report Status 07/12/2021 FINAL  Final  Urine Culture     Status: Abnormal   Collection Time: 07/05/21 10:48 AM   Specimen: PATH Other; Urine  Result Value Ref Range Status   Specimen Description   Final    URINE, RANDOM RIGHT KIDNEY URINE Performed at [facility], [address], [place], [place] [address]    Special Requests   Final    NONE Performed at [facility], [address], [place], [place] [address]    Culture (A)  Final    >=100,000 COLONIES/mL CANDIDA GLABRATA SEE SEPARATE REPORT Performed at [facility], [place] [address], [place],  [address]    Report Status 07/17/2021 FINAL  Final  Antifungal AST 9 Drug Panel     Status: None   Collection Time: 07/05/21 10:48 AM  Result Value Ref Range Status   Organism ID, Yeast Candida glabrata  Corrected     Comment: (NOTE) Identification performed by account, not confirmed by this laboratory. CORRECTED ON 08/21 AT 1535: PREVIOUSLY REPORTED AS Preliminary report    Amphotericin B MIC 0.5 ug/mL  Final    Comment: (NOTE) Breakpoints have been established for only some organism-drug combinations as indicated. This test was developed and its performance characteristics determined by [facility]. It has not been cleared or approved by the Food  and Drug Administration.    Anidulafungin MIC Comment  Final    Comment: (NOTE) 0.03 ug/mL Susceptible Breakpoints have been established for only some organism-drug combinations as indicated. This test was developed and its performance characteristics determined by [facility]. It has not been cleared or approved by the Food and Drug Administration.    Caspofungin MIC Comment  Final    Comment: (NOTE) 0.06 ug/mL Susceptible Breakpoints have been established for only some organism-drug combinations as indicated. This test was developed and its performance characteristics determined by [facility]. It has not been cleared or approved by the Food and Drug Administration.    Micafungin MIC Comment  Final    Comment: (NOTE) 0.016 ug/mL Susceptible Breakpoints have been established for only some organism-drug combinations as indicated. This test was developed and its performance characteristics determined by [facility]. It has not been cleared or approved by the Food and Drug Administration.    Posaconazole MIC 0.25 ug/mL  Final    Comment: (NOTE) Breakpoints have been established for only some organism-drug combinations as indicated. This test was developed and its performance characteristics determined by [facility]. It has not been cleared or approved by the Food and Drug Administration.    Fluconazole Islt MIC 2.0 ug/mL  Final    Comment: (NOTE) Susceptible Dose Dependent Breakpoints have been established for only some organism-drug combinations as  indicated. This test was developed and its performance characteristics determined by [facility]. It has not been cleared or approved by the Food and Drug Administration.    Flucytosine MIC 0.06 ug/mL or less  Final    Comment: (NOTE) Breakpoints have been established for only some organism-drug combinations as indicated. This test was developed and its performance characteristics determined by [facility]. It has not been cleared or approved by the Food and Drug Administration.    Itraconazole MIC 0.12 ug/mL  Final    Comment: (NOTE) Breakpoints have been established for only some organism-drug combinations as indicated. This test was developed and its performance characteristics determined by [facility]. It has not been cleared or approved by the Food and Drug Administration.    Voriconazole MIC 0.06 ug/mL  Final    Comment: (NOTE) Breakpoints have been established for only some organism-drug combinations as indicated. This test was developed and its performance characteristics determined by [facility]. It has not been cleared or approved by the Food and Drug Administration. Performed At: [facility] [address] [place], [place] [id number] Rush Farmer MD FX:[id number]    Source CANDIDA GLABRATA SUSCEPTIBILITY URINE CULTURE  Final    Comment: Performed at [facility], [place] [address], [place], [place] [address]  CULTURE, BLOOD (ROUTINE X 2) w Reflex to ID Panel     Status: None (Preliminary result)   Collection Time: 07/15/21  7:57 PM   Specimen: BLOOD  Result Value Ref Range Status   Specimen Description BLOOD LEFT ASSIST CONTROL  Final   Special Requests   Final    BOTTLES DRAWN AEROBIC AND ANAEROBIC Blood Culture adequate volume   Culture   Final    NO GROWTH 4 DAYS Performed at [facility], [address] [place], [place] [address]    Report Status PENDING  Incomplete  CULTURE, BLOOD (ROUTINE X 2) w Reflex to ID Panel     Status: None (Preliminary  result)   Collection Time: 07/15/21  8:07 PM   Specimen: BLOOD  Result Value Ref Range Status   Specimen Description BLOOD LEFT FOREARM  Final   Special  Requests   Final    BOTTLES DRAWN AEROBIC AND ANAEROBIC Blood Culture adequate volume   Culture   Final    NO GROWTH 4 DAYS Performed at [facility], [address] [place], [place] [address]    Report Status PENDING  Incomplete    Coagulation Studies: Recent Labs    07/16/21 Feb 23, 2216  LABPROT 15.5*  INR 1.2      Urinalysis: Recent Labs    07/18/21 1330  COLORURINE YELLOW*  LABSPEC 1.014  PHURINE 5.0  GLUCOSEU 50*  HGBUR SMALL*  BILIRUBINUR NEGATIVE  KETONESUR NEGATIVE  PROTEINUR 100*  NITRITE NEGATIVE  LEUKOCYTESUR SMALL*        Imaging: US RENAL  Result Date: 07/17/2021 CLINICAL DATA:  Renal abscess EXAM: RENAL / URINARY TRACT ULTRASOUND COMPLETE COMPARISON:  07/15/2021 FINDINGS: Right Kidney: Renal measurements: 13.7 x 6.3 x 6.3 cm = volume: 280 mL. Mildly increased renal cortical echogenicity. 1.6 cm echogenic, shadowing stone within the lower pole of the right kidney. No mass or hydronephrosis visualized. Left Kidney: Renal measurements: 12.6 x 6.8 x 6.1 cm = volume: 272 mL. Mildly increased renal cortical echogenicity. Multiple shadowing stones within the left kidney, largest measuring up to 1.2 cm. No mass or hydronephrosis visualized. Bladder: Appears normal for degree of bladder distention. Other: A small amount of free fluid is seen adjacent to both kidneys. Technical note: Technically limited examination secondary to poor penetration related to patient body habitus and difficulties with patient positioning as patient was unable to roll. IMPRESSION: 1. Limited exam. 2. Bilateral nonobstructing renal calculi. 3. Small amount of free fluid is seen adjacent to both kidneys, not well evaluated sonographically. 4. Mildly increased renal cortical echogenicity suggesting medical renal disease. Electronically  Signed   By: Davina Poke D.O.   On: 07/17/2021 19:00   DG Chest Port 1 View  Result Date: 07/18/2021 CLINICAL DATA:  75 year old female with respiratory distress, shortness of breath. EXAM: PORTABLE CHEST 1 VIEW COMPARISON:  07/16/2021 and earlier. FINDINGS: Portable AP semi upright view at 0939 hours. No endotracheal tube identified. Enteric tube courses to the left abdomen, tip not included. Stable left chest dual lumen dialysis type catheter. Mildly improved lung volumes and mediastinal contours. No pneumothorax, pulmonary edema, pleural effusion or consolidation. Mild streaky opacity at both lung bases most resembles atelectasis, also improved. Paucity of bowel gas in the upper abdomen. Chronic right midshaft clavicle deformity. No acute osseous abnormality identified. IMPRESSION: Larger lung volumes with regressed but not resolved lung base atelectasis. No new cardiopulmonary abnormality. Electronically Signed   By: Genevie Georgianne M.D.   On: 07/18/2021 10:25     Medications:    feeding supplement (NEPRO CARB STEADY) 50 mL/hr at 07/19/21 1100   fluconazole (DIFLUCAN) IV     piperacillin-tazobactam (ZOSYN)  IV Stopped (07/19/21 0927)    amLODipine  10 mg Per Tube Daily   chlorhexidine  15 mL Mouth Rinse BID   Chlorhexidine Gluconate Cloth  6 each Topical Q0600   cloNIDine  0.1 mg Per Tube Daily   epoetin alfa       epoetin (EPOGEN/PROCRIT) injection  10,000 Units Intravenous Q M,W,F-HD   famotidine  20 mg Per Tube QHS   feeding supplement (PROSource TF)  45 mL Per Tube BID   free water  200 mL Per Tube Q4H   heparin injection (subcutaneous)  5,000 Units Subcutaneous Q8H   heparin sodium (porcine)       insulin aspart  0-20 Units Subcutaneous Q4H  insulin aspart  6 Units Subcutaneous Q4H   insulin detemir  20 Units Subcutaneous BID   mouth rinse  15 mL Mouth Rinse q12n4p   metoprolol tartrate  50 mg Per Tube BID   multivitamin  1 tablet Per Tube QHS   nutrition supplement (JUVEN)  1  packet Per Tube BID BM     Assessment/ Plan:  Heather Lopez is a 75 y.o. white female with diabetes mellitus type II, hypertension, hyperlipidemia, nephrolithiasis, depression, gout who is admitted to [facility] on 06/27/2021 for Hyperkalemia [E87.5] Hyponatremia [E87.1] ARF (acute renal failure) (HCC) [N17.9] Acute renal failure (ARF) (HCC) [N17.9] Acute respiratory failure with hypoxia ([place]) [J96.01] Demand ischemia of myocardium (HCC) [I24.8] Acute respiratory failure with hypoxemia (HCC) [J96.01] Acute renal failure superimposed on chronic kidney disease, unspecified CKD stage, unspecified acute renal failure type ([place]) [N17.9, N18.9] Sepsis, due to unspecified organism, unspecified whether acute organ dysfunction present [facility]) [A41.9]  Acute Kidney Injury on chronic kidney disease stage IV with baseline creatinine 1.97 and GFR of 25 on 12/22/19.Continues to be dialysis dependent despite nonoliguric urine output.  Acute kidney injury secondary to sepsis and obstructive uropathy Chronic kidney disease is secondary to diabetic nephropathy Continue to hold losartan and metformin Plan on intermittent hemodialysis three times a week  Lab Results  Component Value Date   CREATININE 2.08 (H) 07/19/2021   CREATININE 1.82 (H) 07/18/2021   CREATININE 2.02 (H) 07/17/2021    Intake/Output Summary (Last 24 hours) at 07/19/2021 1253 Last data filed at 07/19/2021 1100 Gross per 24 hour  Intake 3200.24 ml  Output 2530 ml  Net 670.24 ml    2. Acute Respiratory failure secondary to community acquired pneumonia failing outpatient antibiotics. Febrile.  - Continue supplement oxygen.  - IV pip/tazo and fluconazole - 8/20 blood cultures with no growth x 4 days.   3. Anemia of chronic kidney disease Normocytic Lab Results  Component Value Date   HGB 7.8 (L) 07/19/2021   EPO with HD treatment on 8/18 - Scheduled EPO on MWF HD treatments.   4.  Diabetes mellitus type II with chronic kidney  disease insulin dependent. Most recent hemoglobin A1c is 8.3% on 06/12/21.  Metformin held    5.Bilateral Nephrolithiasis- with obstructive uropathy and left renal abscess  5. Hypertension with tachycardia:  - Continue clonidine, amlodipine, and metoprolol.    6. Urinary tract infection: candida glabrata.   - fluconazole. Appreciate critical care input.   8. Hypokalemia: 3K bath on dialysis.  - potassium supplementation     LOS: 22 Heather Lopez 8/24/202212:53 PM

## 2021-07-19 NOTE — Progress Notes (Signed)
As per husband at bedside patient has been more alert today She is starting to respond verbally On 50% nasal cannula No pressors No fever She had dialysis today.  Has been tired after that and sleeping. Somnolent BP (!) 138/53   Pulse (!) 102   Temp 99.4 F (37.4 C)   Resp (!) 37   Ht 5' 2.01" (1.575 m)   Wt 125.9 kg   SpO2 98%   BMI 50.75 kg/m   On high flow nasal cannula Chest bilateral air entry Heart sound S1-S2 Abdomen soft JP drains have been removed Foley catheter has been removed She has a dialysis catheter IJ  Labs CBC Latest Ref Rng & Units 07/19/2021 07/18/2021 07/17/2021  WBC 4.0 - 10.5 K/uL 7.8 7.9 7.1  Hemoglobin 12.0 - 15.0 g/dL 7.8(L) 7.9(L) 8.1(L)  Hematocrit 36.0 - 46.0 % 25.3(L) 26.2(L) 26.7(L)  Platelets 150 - 400 K/uL 358 379 346    CMP Latest Ref Rng & Units 07/19/2021 07/18/2021 07/17/2021  Glucose 70 - 99 mg/dL 251(H) 232(H) 109(H)  BUN 8 - 23 mg/dL 87(H) 71(H) 83(H)  Creatinine 0.44 - 1.00 mg/dL 2.08(H) 1.82(H) 2.02(H)  Sodium 135 - 145 mmol/L 141 144 149(H)  Potassium 3.5 - 5.1 mmol/L 3.2(L) 3.4(L) 3.6  Chloride 98 - 111 mmol/L 100 98 111  CO2 22 - 32 mmol/L 30 32 31  Calcium 8.9 - 10.3 mg/dL 9.1 9.0 8.8(L)  Total Protein 6.5 - 8.1 g/dL - - 6.0(L)  Total Bilirubin 0.3 - 1.2 mg/dL - - 0.5  Alkaline Phos 38 - 126 U/L - - 107  AST 15 - 41 U/L - - 52(H)  ALT 0 - 44 U/L - - 88(H)    Micro 07/15/2021 blood culture no growth 07/05/2021 urine culture Candida glabrata Fluconazole MIC 2   Imaging CT abdomen done on 07/15/2021 Sigmoid diverticulosis No active inflammatory changes No bowel obstruction Bilateral ureteral stents in similar position Mild bilateral hydronephrosis Bilateral nonobstructing renal calculi measuring up to 11 mm Percutaneous drainage catheter in the right hemipelvis Diffuse subcutaneous edema No fluid collection.No acute intra-abdominal or pelvic pathology seen  Impression/recommendation  Duodenal perforation.  Status  post laparotomy with closure She had 2 JP drains which have been removed She completed Zosyn and then after a period of 5 days had to be restarted over the weekend because of fever. No fever in 48 hours  Complicated UTI with Candida glabrata. Bilateral hydronephrosis secondary to nephrolithiasis.  Status post stent placement She is currently on high-dose fluconazole.  Fluconazole MIC is 2 and it is susceptible. No need for amphotericin B. Ultrasound done yesterday did not show any renal abscess as well  Anemia has received multiple blood transfusion  A. fib on metoprolol  Acute hypoxic respiratory failure status post extubation.  On high flow oxygen  Encephalopathy fluctuating   AKI on dialysis  Discussed the management with the husband and the care team.

## 2021-07-19 NOTE — Evaluation (Signed)
Clinical/Bedside Swallow Evaluation Patient Details  Name: Heather Lopez MRN: [id number] Date of Birth: July 11, 1946  Today's Date: 07/19/2021 Time: SLP Start Time (ACUTE ONLY): 1455 SLP Stop Time (ACUTE ONLY): 1550 SLP Time Calculation (min) (ACUTE ONLY): 55 min  Past Medical History:  Past Medical History:  Diagnosis Date   Anxiety    Cataract    Depression    Diabetes mellitus without complication ([place])    Gout    History of kidney stones    Hyperlipidemia    Hypertension    Vertigo    Past Surgical History:  Past Surgical History:  Procedure Laterality Date   CATARACT EXTRACTION, BILATERAL Bilateral 2019   COLONOSCOPY WITH PROPOFOL N/A 08/19/2015   Procedure: COLONOSCOPY WITH PROPOFOL;  Surgeon: Lucilla Lame, MD;  Location: [place];  Service: Endoscopy;  Laterality: N/A;  diabetic - insulin   CYSTOSCOPY W/ RETROGRADES Bilateral 12/22/2019   Procedure: CYSTOSCOPY WITH RETROGRADE PYELOGRAM;  Surgeon: Abbie Sons, MD;  Location: [facility];  Service: Urology;  Laterality: Bilateral;   CYSTOSCOPY W/ URETERAL STENT PLACEMENT Bilateral 07/05/2021   Procedure: CYSTOSCOPY WITH RETROGRADE PYELOGRAM/BILATERAL URETERAL STENT PLACEMENT;  Surgeon: Abbie Sons, MD;  Location: [facility];  Service: Urology;  Laterality: Bilateral;   CYSTOSCOPY WITH BIOPSY N/A 12/22/2019   Procedure: CYSTOSCOPY WITH bladder BIOPSY;  Surgeon: Abbie Sons, MD;  Location: [facility];  Service: Urology;  Laterality: N/A;   CYSTOSCOPY WITH STENT PLACEMENT Right 12/22/2019   Procedure: CYSTOSCOPY WITH STENT PLACEMENT;  Surgeon: Abbie Sons, MD;  Location: [facility];  Service: Urology;  Laterality: Right;   CYSTOSCOPY WITH URETEROSCOPY Bilateral 12/22/2019   Procedure: CYSTOSCOPY WITH URETEROSCOPY;  Surgeon: Abbie Sons, MD;  Location: [facility];  Service: Urology;  Laterality: Bilateral;   CYSTOSCOPY/URETEROSCOPY/HOLMIUM LASER/STENT PLACEMENT Right 12/22/2019   Procedure:  CYSTOSCOPY/URETEROSCOPY/HOLMIUM LASER/STENT PLACEMENT;  Surgeon: Abbie Sons, MD;  Location: [facility];  Service: Urology;  Laterality: Right;   DIALYSIS/PERMA CATHETER INSERTION N/A 06/29/2021   Procedure: DIALYSIS/PERMA CATHETER INSERTION;  Surgeon: Algernon Huxley, MD;  Location: [place] [facility];  Service: Cardiovascular;  Laterality: N/A;   DIALYSIS/PERMA CATHETER INSERTION N/A 07/13/2021   Procedure: DIALYSIS/PERMA CATHETER INSERTION;  Surgeon: Algernon Huxley, MD;  Location: [place] [facility];  Service: Cardiovascular;  Laterality: N/A;   EYE SURGERY     LAPAROTOMY N/A 06/29/2021   Procedure: EXPLORATORY LAPAROTOMY WITH REPAIR OF DUODENAL PERFORATION;  Surgeon: [name], MD;  Location: [facility];  Service: General;  Laterality: N/A;   POLYPECTOMY  08/19/2015   Procedure: POLYPECTOMY;  Surgeon: Lucilla Lame, MD;  Location: [place];  Service: Endoscopy;;   TUBAL LIGATION  1992   HPI:  Pt is a [name] who presented to the ED on 06/27/21 with chief complaints of progressive shortness of breath, productive cough, loss of appetite, nausea, dry heaving fatigue, malaise and generalized body aches. Patient initially went to her PCP on 7/29 with complaints of rhinorrhea, harsh cough, loss of appetite, nausea, fatigue, malaise and generalized body aches x2 days, patient was diagnosed with pneumonia of which she received ceftriaxone 1 mg IM and sent home with Levaquin, prednisone and Tessalon. On 8/4 pt developed acute abdominal pain with CT showing viscus perforation. Pt urgently taken to the OR for ex lap. Pt is also s/p cystoscopy with B ureteral stent placement for L obstructed kidney stone on 8/10. Pt was orally intubated until 07/14/2021.  CXR: 07/18/21 - Larger lung volumes with regressed but not  resolved lung base  atelectasis. No new cardiopulmonary abnormality.   Assessment / Plan / Recommendation Clinical Impression  Pt appears to present w/ potential oropharyngeal phase dysphagia  in light of severe illness and deconditioning; also, pt has declined Cognitive engagement at this time requiring Mod. verbal/tactile cues for follow through w/ po tasks(ice chips, oral care). Pt currently has an NGT for TFs and is on HFNC for O2 support. She is on HD support. Declined Cognitive awarenss c/b eyes closed, incoherent speech at times, and poor orientation; she required Mod cues for following commands.  Per chart, NSG and Husband's report, pt has improved overall since yesterday.   During po trials of ice chips post oral care, pt exhibited declined oral prep awareness, increased oral phase time w/ mild decreased oral/lingual organization for bolus management -- increased oral phase time(mild) and increased to/fro lingual movements noted. Pt required verbal/tactile stim to open eyes and engage in the ice chips each presentation. Pt cleared orally; pharyngeal swallowing(single swallows) appreciated. Mild Cough x1 post 4th trial; no increased coughing noted.   Recommend Pleasure and Therapeutic single ice chip trials post thorough oral care for hygiene and stimulation of swallowing. Recommend strict aspiration precautions including head forward and must be alert to engage in tasks given cues.   ST services will continue to follow w/ ongoing assessment and appropriateness to upgrade to trials of thin liquids and foods as pt's Cognitive engagement and respiratory status improves. NSG/MD and Husband updated, agreed. Precautions posted in room. SLP Visit Diagnosis: Dysphagia, oropharyngeal phase (R13.12) (Cognitive decline currently)    Aspiration Risk  Moderate aspiration risk;Risk for inadequate nutrition/hydration    Diet Recommendation     Medication Administration: Via alternative means (NGT)    Other  Recommendations Recommended Consults:  (Dietician following) Oral Care Recommendations: Oral care QID;Oral care before and after PO;Oral care prior to ice chip/H20 (ice chips) Other  Recommendations:  (TBD)   Follow up Recommendations Skilled Nursing facility (TBD)      Frequency and Duration min 3x week  2 weeks       Prognosis Prognosis for Safe Diet Advancement: Fair Barriers to Reach Goals: Cognitive deficits;Time post onset;Severity of deficits;Behavior      Swallow Study   General Date of Onset: 06/27/21 HPI: Pt is a [name] who presented to the ED on 06/27/21 with chief complaints of progressive shortness of breath, productive cough, loss of appetite, nausea, dry heaving fatigue, malaise and generalized body aches. Patient initially went to her PCP on 7/29 with complaints of rhinorrhea, harsh cough, loss of appetite, nausea, fatigue, malaise and generalized body aches x2 days, patient was diagnosed with pneumonia of which she received ceftriaxone 1 mg IM and sent home with Levaquin, prednisone and Tessalon. On 8/4 pt developed acute abdominal pain with CT showing viscus perforation. Pt urgently taken to the OR for ex lap. Pt is also s/p cystoscopy with B ureteral stent placement for L obstructed kidney stone on 8/10. Pt was orally intubated until 07/14/2021.  CXR: 07/18/21 - Larger lung volumes with regressed but not resolved lung base  atelectasis. No new cardiopulmonary abnormality. Type of Study: Bedside Swallow Evaluation Previous Swallow Assessment: none Diet Prior to this Study: NG Tube Temperature Spikes Noted: No (wbc 7.8) Respiratory Status: Nasal cannula;Increased respiratory rate (HFNC at 40% fio2) History of Recent Intubation: Yes Length of Intubations (days): 14 days Date extubated: 07/14/21 Behavior/Cognition: Cooperative;Pleasant mood;Alert;Confused;Distractible;Requires cueing;Lethargic/Drowsy Oral Cavity Assessment: Dry (min) Oral Care Completed by SLP: Yes Oral Cavity -  Dentition: Adequate natural dentition;Missing dentition (few) Vision:  (n/a) Self-Feeding Abilities: Total assist Patient Positioning: Postural control adequate for testing  (head forward) Baseline Vocal Quality: Low vocal intensity;Normal (mumbled speech at times; eyes closed) Volitional Cough: Cognitively unable to elicit Volitional Swallow: Unable to elicit    Oral/Motor/Sensory Function Overall Oral Motor/Sensory Function: Mild impairment (pt tended to hold tongue in an elevated position, back) Facial ROM: Within Functional Limits Facial Symmetry: Within Functional Limits Lingual ROM: Within Functional Limits Lingual Symmetry: Within Functional Limits Lingual Strength: Within Functional Limits (esp. posterior) Mandible: Within Functional Limits   Ice Chips Ice chips: Within functional limits Presentation: Spoon (fed; 11 trials) Other Comments: declined oral prep awareness -- responded to verbal/tactile cues   Thin Liquid Thin Liquid: Not tested    Nectar Thick Nectar Thick Liquid: Not tested   Honey Thick Honey Thick Liquid: Not tested   Puree     Solid     Solid: Not tested       Orinda Kenner, MS, CCC-SLP Speech Language Pathologist Rehab Services [phone] [place] [place] 07/19/2021,4:00 PM

## 2021-07-20 ENCOUNTER — Inpatient Hospital Stay: Payer: Medicare Other

## 2021-07-20 DIAGNOSIS — N151 Renal and perinephric abscess: Secondary | ICD-10-CM | POA: Diagnosis not present

## 2021-07-20 DIAGNOSIS — E876 Hypokalemia: Secondary | ICD-10-CM

## 2021-07-20 LAB — BASIC METABOLIC PANEL
Anion gap: 12 (ref 5–15)
BUN: 55 mg/dL — ABNORMAL HIGH (ref 8–23)
CO2: 25 mmol/L (ref 22–32)
Calcium: 8.9 mg/dL (ref 8.9–10.3)
Chloride: 99 mmol/L (ref 98–111)
Creatinine, Ser: 2.04 mg/dL — ABNORMAL HIGH (ref 0.44–1.00)
GFR, Estimated: 25 mL/min — ABNORMAL LOW (ref >60)
Glucose, Bld: 190 mg/dL — ABNORMAL HIGH (ref 70–99)
Potassium: 4.1 mmol/L (ref 3.5–5.1)
Sodium: 136 mmol/L (ref 135–145)

## 2021-07-20 LAB — CBC
HCT: 28.5 % — ABNORMAL LOW (ref 36.0–46.0)
Hemoglobin: 8.6 g/dL — ABNORMAL LOW (ref 12.0–15.0)
MCH: 27.7 pg (ref 26.0–34.0)
MCHC: 30.2 g/dL (ref 30.0–36.0)
MCV: 91.6 fL (ref 80.0–100.0)
Platelets: 202 10*3/uL (ref 150–400)
RBC: 3.11 MIL/uL — ABNORMAL LOW (ref 3.87–5.11)
RDW: 15.9 % — ABNORMAL HIGH (ref 11.5–15.5)
WBC: 10.7 10*3/uL — ABNORMAL HIGH (ref 4.0–10.5)
nRBC: 0.4 % — ABNORMAL HIGH (ref 0.0–0.2)

## 2021-07-20 LAB — IRON AND TIBC
Iron: 14 ug/dL — ABNORMAL LOW (ref 28–170)
Saturation Ratios: 8 % — ABNORMAL LOW (ref 10.4–31.8)
TIBC: 185 ug/dL — ABNORMAL LOW (ref 250–450)
UIBC: 171 ug/dL

## 2021-07-20 LAB — CULTURE, BLOOD (ROUTINE X 2)
Culture: NO GROWTH
Culture: NO GROWTH
Special Requests: ADEQUATE
Special Requests: ADEQUATE

## 2021-07-20 LAB — GLUCOSE, CAPILLARY
Glucose-Capillary: 209 mg/dL — ABNORMAL HIGH (ref 70–99)
Glucose-Capillary: 179 mg/dL — ABNORMAL HIGH (ref 70–99)
Glucose-Capillary: 196 mg/dL — ABNORMAL HIGH (ref 70–99)
Glucose-Capillary: 217 mg/dL — ABNORMAL HIGH (ref 70–99)
Glucose-Capillary: 207 mg/dL — ABNORMAL HIGH (ref 70–99)
Glucose-Capillary: 184 mg/dL — ABNORMAL HIGH (ref 70–99)

## 2021-07-20 LAB — MAGNESIUM: Magnesium: 1.9 mg/dL (ref 1.7–2.4)

## 2021-07-20 LAB — PHOSPHORUS: Phosphorus: 2.7 mg/dL (ref 2.5–4.6)

## 2021-07-20 LAB — VITAMIN B12: Vitamin B-12: 330 pg/mL (ref 180–914)

## 2021-07-20 MED ORDER — DEXTROSE 5% FOR FLUSHING BEFORE AND AFTER AMBISOME
10.0000 mL | INTRAVENOUS | Status: DC
  Administered 2021-07-20: 10 mL via INTRAVENOUS
  Filled 2021-07-20 (×2): qty 50

## 2021-07-20 MED ORDER — DIPHENHYDRAMINE HCL 25 MG PO CAPS
25.0000 mg | ORAL_CAPSULE | Freq: Every day | ORAL | Status: DC | PRN
  Filled 2021-07-20: qty 1

## 2021-07-20 MED ORDER — FLUCONAZOLE IN SODIUM CHLORIDE 400-0.9 MG/200ML-% IV SOLN
800.0000 mg | INTRAVENOUS | Status: DC
  Filled 2021-07-20 (×2): qty 400

## 2021-07-20 MED ORDER — METOPROLOL TARTRATE 50 MG PO TABS
100.0000 mg | ORAL_TABLET | Freq: Two times a day (BID) | ORAL | Status: DC
  Administered 2021-07-20 – 2021-08-03 (×27): 100 mg
  Filled 2021-07-20 (×15): qty 2
  Filled 2021-07-20: qty 4
  Filled 2021-07-20 (×11): qty 2

## 2021-07-20 MED ORDER — MEPERIDINE HCL 25 MG/ML IJ SOLN: 25.0000 mg | INTRAMUSCULAR | Status: DC | PRN

## 2021-07-20 MED ORDER — FLUCONAZOLE IN SODIUM CHLORIDE 400-0.9 MG/200ML-% IV SOLN
400.0000 mg | Freq: Once | INTRAVENOUS | Status: AC
  Administered 2021-07-20: 400 mg via INTRAVENOUS
  Filled 2021-07-20: qty 200

## 2021-07-20 MED ORDER — DIPHENHYDRAMINE HCL 50 MG/ML IJ SOLN: 25.0000 mg | Freq: Every day | INTRAMUSCULAR | Status: DC | PRN

## 2021-07-20 MED ORDER — SODIUM CHLORIDE 0.9 % IV BOLUS FOR AMBISOME
500.0000 mL | INTRAVENOUS | Status: DC
  Administered 2021-07-20: 500 mL via INTRAVENOUS

## 2021-07-20 MED ORDER — FLUCONAZOLE IN SODIUM CHLORIDE 400-0.9 MG/200ML-% IV SOLN
500.0000 mg | Freq: Once | INTRAVENOUS | Status: DC
  Filled 2021-07-20: qty 250

## 2021-07-20 MED ORDER — SODIUM CHLORIDE 0.9 % IV BOLUS FOR AMBISOME: 500.0000 mL | INTRAVENOUS | Status: DC

## 2021-07-20 MED ORDER — DEXTROSE 5% FOR FLUSHING BEFORE AND AFTER AMBISOME
10.0000 mL | INTRAVENOUS | Status: DC
  Filled 2021-07-20 (×2): qty 50

## 2021-07-20 MED ORDER — DEXTROSE 5 % IV SOLN
5.0000 mg/kg | INTRAVENOUS | Status: DC
  Filled 2021-07-20: qty 100

## 2021-07-20 NOTE — Progress Notes (Signed)
PT Cancellation Note  Patient Details Name: Heather Lopez MRN: [id number] DOB: [birth year]   Cancelled Treatment:     PT attempt x 2 this date. Earlier pt refused. Currently soundly asleep with MEWS of 4. Did have CT not long ago. Acute PT will continue to follow and progress as able per current POC   Willette Pa 07/20/2021, 3:11 PM

## 2021-07-20 NOTE — Evaluation (Signed)
Occupational Therapy Evaluation Patient Details Name: Heather Lopez MRN: [id number] DOB: 01/11/1946 Today's Date: 07/20/2021    History of Present Illness Pt is a [name] who presented to the ED on 06/27/21 with chief complaints of progressive shortness of breath, productive cough, loss of appetite, nausea, dry heaving fatigue, malaise and generalized body aches. Patient initially went to her PCP on 7/29 with complaints of rhinorrhea, harsh cough, loss of appetite, nausea, fatigue, malaise and generalized body aches x2 days, patient was diagnosed with pneumonia of which she received ceftriaxone 1 mg IM and sent home with Levaquin, prednisone and Tessalon. On 8/4 pt developed acute abdominal pain with CT showing viscus perforation. Pt urgently taken to the OR for ex lap. Pt is also s/p cystoscopy with B ureteral stent placement for L obstructed kidney stone on 8/10.   Clinical Impression   Ms Koppel was seen for limited OT evaluation this date. Prior to hospital admission, pt was MOD I using SPC for limited distances. Pt presents to acute OT demonstrating impaired ADL performance and functional mobility 2/2 lethargy, decreased activity tolerance, and functional strength deficits.  Upon arrival husband at bedside reporting pt with lethargy this date, responds to deep pressure and spouse's voice only with eye opening. Husband instructed on edema mgmt strategies, PROM exercises, and importance of hand positioning to prevent contractures. Pt currently requires TOTAL A for all bed level ADLs. Will see pt for trial of OT of 3 sessions to ensure carryover of education and trial resting hand splints. Upon hospital discharge, recommend LTACH.     Follow Up Recommendations  [facility] bed    Recommendations for Other Services       Precautions / Restrictions Precautions Precautions: Fall Precaution Comments: NG tube, abdominal incision & JP drains, foley catheter, fecal  tube, L jugular tunneled hemodialysis catheter Restrictions Weight Bearing Restrictions: No      Mobility Bed Mobility               General bed mobility comments: will require +2, unable to attempt 2/2 lethargy                          ADL either performed or assessed with clinical judgement   ADL Overall ADL's : Needs assistance/impaired                                       General ADL Comments: TOTAL A for all ADLs 2/2 lethargy      Pertinent Vitals/Pain Pain Assessment: Faces Faces Pain Scale: Hurts a little bit Pain Location: BLE when OT performs PROM Pain Descriptors / Indicators: Discomfort;Grimacing;Guarding Pain Intervention(s): Limited activity within patient's tolerance;Repositioned     Hand Dominance     Extremity/Trunk Assessment Upper Extremity Assessment Upper Extremity Assessment:  (grossly 1/5 deltoid, 2-/5 biceps and grip)   Lower Extremity Assessment Lower Extremity Assessment: Defer to PT evaluation       Communication     Cognition Arousal/Alertness: Lethargic Behavior During Therapy: Flat affect Overall Cognitive Status: Difficult to assess                                     General Comments  SpO2 90s on HFNC    Exercises Exercises: Other exercises Other  Exercises Other Exercises: Family educated re: OT role, d/c recs, PROM HEP Other Exercises: PROM in all extremeties   Shoulder Instructions      Home Living Family/patient expects to be discharged to:: Private residence Living Arrangements: Spouse/significant other Available Help at Discharge: Family;Available 24 hours/day Type of Home: House Home Access: Stairs to enter [employer] of Steps: 1 small threshold step   Home Layout: Multi-level;Able to live on main level with bedroom/bathroom               Home Equipment: Walker - 4 wheels;Cane - single point;Wheelchair - manual          Prior  Functioning/Environment Level of Independence: Needs assistance  Gait / Transfers Assistance Needed: Pt required assistance for transfers. Pt ambulated short distances in house with bilateral SPCs & assist from husband, otherwise he would push her around in w/c. Prior to husband Dietitian bedroom on first floor pt was able to negotiate flight of steps to bedroom at night with step to pattern. Pt's husband reports max ambulation distance was 20 ft in church with [facility] without assistance. Notes pt was scheduled for hip replacement August 24 prior to these events.              OT Problem List: Decreased strength;Decreased range of motion;Decreased activity tolerance;Impaired balance (sitting and/or standing);Decreased safety awareness;Cardiopulmonary status limiting activity      OT Treatment/Interventions: Therapeutic exercise;Self-care/ADL training;Energy conservation;DME and/or AE instruction;Cognitive remediation/compensation;Therapeutic activities;Patient/family education;Balance training    OT Goals(Current goals can be found in the care plan section) Acute Rehab OT Goals Patient Stated Goal: to go home OT Goal Formulation: With family Time For Goal Achievement: 08/03/21 Potential to Achieve Goals: Poor ADL Goals Pt Will Transfer to Toilet: with max assist;with +2 assist (bed level) Pt/caregiver will Perform Home Exercise Program: Both right and left upper extremity;With written HEP provided (c caregiver independent in managing PROM bedlevel exercises) Additional ADL Goal #1: Pt will respond to 2/5 1 step commands c MOD cues  OT Frequency: Min 1X/week   Barriers to D/C: Inaccessible home environment;Decreased caregiver support          Co-evaluation              AM-PAC OT "6 Clicks" Daily Activity     Outcome Measure Help from another person eating meals?: Total Help from another person taking care of personal grooming?: Total Help from another person toileting, which  includes using toliet, bedpan, or urinal?: Total Help from another person bathing (including washing, rinsing, drying)?: Total Help from another person to put on and taking off regular upper body clothing?: Total Help from another person to put on and taking off regular lower body clothing?: Total 6 Click Score: 6   End of Session Equipment Utilized During Treatment: Oxygen  Activity Tolerance: Patient limited by lethargy Patient left: in bed;with call bell/phone within reach;with family/visitor present  OT Visit Diagnosis: Muscle weakness (generalized) (M62.81)                Time: 4401-0272 OT Time Calculation (min): 15 min Charges:  OT General Charges $OT Visit: 1 Visit OT Evaluation $OT Eval Low Complexity: 1 Low  Dessie Coma, M.S. OTR/L  07/20/21, 3:54 PM  ascom [phone]

## 2021-07-20 NOTE — Progress Notes (Signed)
ID Somnolent Obtunded   On hi flo nasal cannual NG feeds Chest b/l air entry Hs afib RVR Abd lap scar healed Foley had to replaced as patient was retaining urine had 1800cc of urine   Labs CBC Latest Ref Rng & Units 07/20/2021 07/19/2021 07/18/2021  WBC 4.0 - 10.5 K/uL 10.7(H) 7.8 7.9  Hemoglobin 12.0 - 15.0 g/dL 8.6(L) 7.8(L) 7.9(L)  Hematocrit 36.0 - 46.0 % 28.5(L) 25.3(L) 26.2(L)  Platelets 150 - 400 K/uL 202 358 379     CMP Latest Ref Rng & Units 07/20/2021 07/19/2021 07/18/2021  Glucose 70 - 99 mg/dL 190(H) 251(H) 232(H)  BUN 8 - 23 mg/dL 55(H) 87(H) 71(H)  Creatinine 0.44 - 1.00 mg/dL 2.04(H) 2.08(H) 1.82(H)  Sodium 135 - 145 mmol/L 136 141 144  Potassium 3.5 - 5.1 mmol/L 4.1 3.2(L) 3.4(L)  Chloride 98 - 111 mmol/L 99 100 98  CO2 22 - 32 mmol/L 25 30 32  Calcium 8.9 - 10.3 mg/dL 8.9 9.1 9.0  Total Protein 6.5 - 8.1 g/dL - - -  Total Bilirubin 0.3 - 1.2 mg/dL - - -  Alkaline Phos 38 - 126 U/L - - -  AST 15 - 41 U/L - - -  ALT 0 - 44 U/L - - -     Micro 07/15/21 BC Ng 07/05/21 UC from both kidneys Candida Glabrata  Imaging    Left ureteral stent in place. Mild left hydronephrosis which has progressed from prior exam. Multiple irregular stones in the upper left kidney, including curvilinear stone measuring 2.3 cm. Subcapsular collection in the upper right kidney measures approximately 2.7 x 5.9 cm, previously less well-defined. Left anterior perirenal fluid collection measures approximately 12.3 x 2.2 cm, series 2, image 46. Cranial caudal length of 9.8 cm, with irregular shape and fluid collection coursing in the pericolic gutter and anterior retroperitoneum. Mild adjacent fat stranding. This is not significantly changed from prior exam. Distended urinary bladder. No bladder wall thickening. Right-sided ureteral stent in place. The degree of right hydronephrosis has increased from most recent CT. There is mild right perinephric edema. 12 mm stone in the lower right kidney.  No stone or stone fragments along the course of the ureteral stent. The distal pigtail is coiled in the dependent bladder.  Impression/recommendation  Complicated UTI due to b/l renal stones, PUJ stones, s/p b/l stents Candida Glabrata in Urine culture taken from kidneys- susceptible to fluconazole was on high dose fluconazole- started spiking fevers again Urinary retention and foley had to be placed again Worsening hydronephrosis and subcapsualr collection on th eleft with multiple abscesses worsening and this could be the reason for fever She needs these collections to be drained Recommend urology reconsult Change fluconazole to liposomal ampho B Watch closely fr electrolyte disorders  AKI- pt on dialysis  Hypoxic resp failure- got extubated a few days ago and on hiflo oxygen  Duodenal perforation- s/p lap surgery and closure  Discussed the management with the care team

## 2021-07-20 NOTE — Progress Notes (Signed)
PROGRESS NOTE    Heather RUNDQUIST  Lopez:[id number] DOB: [birth year] DOA: 06/27/2021 PCP: [name], PA-C   Chief complaint.  Altered mental status. Brief Narrative:  Heather Lopez is a 75 y.o. female with history of hypertension, diabetes mellitus, hyperlipidemia has been feeling weak for the last 3 to 4 days.  Patient had a acute renal failure with creatinine of 9.3, potassium 7.5, acute metabolic acidosis with pH 7.15, procalcitonin 6.83.  Patient was diagnosed was septic shock, acute respiratory failure. On night of 8/4, patient developed acute abdominal pain, CT scan showed acute abdomen secondary to perforated duodenum.  Patient was placed on mechanical ventilation after duodenum repair. Patient had a bilateral hydronephrosis, ureteral stents was placed on 8/10. Patient urine culture was positive for Candida glabrata.  ID consult was obtained, patient currently on high-dose of Diflucan.   Assessment & Plan:   Active Problems:   Uncontrolled type 2 diabetes mellitus with insulin therapy ([place])   Hyperlipidemia associated with type 2 diabetes mellitus ([place])   Type 2 diabetes mellitus with stage 4 chronic kidney disease, with long-term current use of insulin ([facility])   Acute respiratory failure with hypoxia ([facility])   Severe sepsis with septic shock (HCC)   Acute lower UTI   Acute kidney injury superimposed on chronic kidney disease (HCC)   Pneumoperitoneum   Pressure injury of skin   Atrial flutter with rapid ventricular response (HCC)   Endotracheally intubated   Bilateral nephrolithiasis   Anemia of chronic disease   Demand ischemia of myocardium (HCC)   Lethargy   Duodenal ulcer perforation ([place])   Renal abscess   Essential hypertension   Acute metabolic encephalopathy. Patient is more sleepy today after fever last night. She has been evaluated by speech therapy, she has very little p.o. intake.  We will continue tube feeding for now.   Sepsis with septic shock secondary to  obstructive uropathy, kidney stone.  Status post bilateral ureteral stents. Candida urinary tract infection Perforated duodenum. S/p repair. Patient is by ID. Still on Diflucan for Candida urinary tract infection. Patient appears to have recurrent fever, had a fever again last night.  Discussed with Dr. Steva Ready, will obtain a CT scan of abdomen/pelvis.  Patient has anaphylactic reaction to IV contrast, CT scan will be performed with oral contrast only.  Oral contrast can be given via feeding tube. I will also obtain a chest x-ray to rule out aspiration pneumonia.  It appears that patient has high risk of aspiration due to altered mental status. Patient does not have any evidence of DVT, she has no leg edema or swelling.  Acute hypoxemic respiratory failure secondary to septic shock. Continue oxygen treatment.  Volume status will be regulated by hemodialysis.  Acute kidney injury on chronic kidney disease stage IV. Anemia Discussed with nephrology, patient renal function does not seem to be improving. Check iron B12 level for anemia.  Atrial flutter with rapid ventricle response Condition had improved  Uncontrolled type 2 diabetes with hyperglycemia Continue current dose of Levemir and sliding scale insulin.  Morbid obesity and obesity hypoventilation syndrome  Continue oxygen treatment   DVT prophylaxis: Heparin Code Status: DNR Family Communication: Husband updated at bedside Disposition Plan:    Status is: Inpatient   DVT prophylaxis: Heparin Code Status: DNR Family Communication: Husband at the bedside Disposition Plan:      Status is: Inpatient   Remains inpatient appropriate because:IV treatments appropriate due to intensity of illness or inability to take PO and Inpatient level  of care appropriate due to severity of illness   Dispo: The patient is from: Home              Anticipated d/c is to:  To be determined              Patient currently is not medically  stable to d/c.              Difficult to place patient No     I/O last 3 completed shifts: In: 4148.8 [I.V.:793; Other:300; NG/GT:2030; IV Piggyback:1025.8] Out: 9518 [Urine:2500; Drains:5; ACZYS:0630] No intake/output data recorded.     Consultants:  Nephrology, ID  Procedures:   Antimicrobials: Zosyn and Diflucan.  Subjective: Patient spiked a fever again last night with tachycardia.  Patient became more confused today. She is still on high flow oxygen, she does not feel short of breath. She does not have any abdominal pain or nausea vomiting, she still has loose stools in the ostomy bag. She has no chest pain palpitation  Objective: Vitals:   07/20/21 0700 07/20/21 0800 07/20/21 0833 07/20/21 0900  BP: (!) 145/57 (!) 161/55  (!) 167/59  Pulse: (!) 113 (!) 110 (!) 107 (!) 109  Resp: (!) 32 (!) 42 (!) 28 (!) 39  Temp:  99.9 F (37.7 C)    TempSrc:      SpO2: 96% 96% 95% 93%  Weight:      Height:        Intake/Output Summary (Last 24 hours) at 07/20/2021 1002 Last data filed at 07/20/2021 0600 Gross per 24 hour  Intake 2431.03 ml  Output 2400 ml  Net 31.03 ml   Filed Weights   07/14/21 0500 07/17/21 0500 07/18/21 0500  Weight: 114 kg 125.5 kg 125.9 kg    Examination:  General exam: Appears calm and comfortable, obese obesity Respiratory system: Clear to auscultation. Respiratory effort normal. Cardiovascular system: S1 & S2 heard, RRR. No JVD, murmurs, rubs, gallops or clicks. No pedal edema. Gastrointestinal system: Abdomen is nondistended, soft and nontender. No organomegaly or masses felt. Normal bowel sounds heard. Central nervous system: Drowsy and disorientated.. No focal neurological deficits. Extremities: Symmetric 5 x 5 power. Skin: No rashes, lesions or ulcers     Data Reviewed: I have personally reviewed following labs and imaging studies  CBC: Recent Labs  Lab 07/16/21 2217 07/17/21 0434 07/18/21 0511 07/19/21 0449 07/20/21 0601  WBC  6.3 7.1 7.9 7.8 10.7*  NEUTROABS 4.5  --   --   --   --   HGB 7.9* 8.1* 7.9* 7.8* 8.6*  HCT 25.4* 26.7* 26.2* 25.3* 28.5*  MCV 89.1 87.5 87.3 86.1 91.6  PLT 328 346 379 358 160   Basic Metabolic Panel: Recent Labs  Lab 07/16/21 0444 07/16/21 2217 07/17/21 0434 07/18/21 0511 07/19/21 0449 07/20/21 0601  NA 149* 147* 149* 144 141 136  K 3.8 3.7 3.6 3.4* 3.2* 4.1  CL 106 108 111 98 100 99  CO2 28 30 31  32 30 25  GLUCOSE 149* 124* 109* 232* 251* 190*  BUN 79* 86* 83* 71* 87* 55*  CREATININE 1.95* 2.08* 2.02* 1.82* 2.08* 2.04*  CALCIUM 8.9 8.7* 8.8* 9.0 9.1 8.9  MG 2.0  --  2.0 1.9 2.2 1.9  PHOS 3.2  --  2.8 2.7 3.6 2.7   GFR: Estimated Creatinine Clearance: 30.7 mL/min (A) (by C-G formula based on SCr of 2.04 mg/dL (H)). Liver Function Tests: Recent Labs  Lab 07/15/21 1036 07/16/21 2217 07/17/21 0434  AST  59* 50* 52*  ALT 139* 92* 88*  ALKPHOS 124 108 107  BILITOT 0.7 0.4 0.5  PROT 5.8* 5.8* 6.0*  ALBUMIN 1.7* 1.7* 1.8*   No results for input(s): LIPASE, AMYLASE in the last 168 hours. No results for input(s): AMMONIA in the last 168 hours. Coagulation Profile: Recent Labs  Lab 07/16/21 2217  INR 1.2   Cardiac Enzymes: No results for input(s): CKTOTAL, CKMB, CKMBINDEX, TROPONINI in the last 168 hours. BNP (last 3 results) No results for input(s): PROBNP in the last 8760 hours. HbA1C: No results for input(s): HGBA1C in the last 72 hours. CBG: Recent Labs  Lab 07/19/21 1617 07/19/21 1925 07/19/21 2352 07/20/21 0354 07/20/21 0717  GLUCAP 324* 269* 222* 209* 179*   Lipid Profile: No results for input(s): CHOL, HDL, LDLCALC, TRIG, CHOLHDL, LDLDIRECT in the last 72 hours. Thyroid Function Tests: No results for input(s): TSH, T4TOTAL, FREET4, T3FREE, THYROIDAB in the last 72 hours. Anemia Panel: No results for input(s): VITAMINB12, FOLATE, FERRITIN, TIBC, IRON, RETICCTPCT in the last 72 hours. Sepsis Labs: Recent Labs  Lab 07/16/21 2217 07/17/21 0026  07/18/21 0511  PROCALCITON 1.54  --  1.88  LATICACIDVEN 1.9 1.7  --     Recent Results (from the past 240 hour(s))  CULTURE, BLOOD (ROUTINE X 2) w Reflex to ID Panel     Status: None   Collection Time: 07/15/21  7:57 PM   Specimen: BLOOD  Result Value Ref Range Status   Specimen Description BLOOD LEFT ASSIST CONTROL  Final   Special Requests   Final    BOTTLES DRAWN AEROBIC AND ANAEROBIC Blood Culture adequate volume   Culture   Final    NO GROWTH 5 DAYS Performed at [facility], [address] [place], [place] [address]    Report Status 07/20/2021 FINAL  Final  CULTURE, BLOOD (ROUTINE X 2) w Reflex to ID Panel     Status: None   Collection Time: 07/15/21  8:07 PM   Specimen: BLOOD  Result Value Ref Range Status   Specimen Description BLOOD LEFT FOREARM  Final   Special Requests   Final    BOTTLES DRAWN AEROBIC AND ANAEROBIC Blood Culture adequate volume   Culture   Final    NO GROWTH 5 DAYS Performed at [facility], [address], [place], [place] [address]    Report Status 07/20/2021 FINAL  Final         Radiology Studies: No results found.      Scheduled Meds:  amLODipine  10 mg Per Tube Daily   chlorhexidine  15 mL Mouth Rinse BID   Chlorhexidine Gluconate Cloth  6 each Topical Q0600   cloNIDine  0.1 mg Per Tube Daily   epoetin (EPOGEN/PROCRIT) injection  10,000 Units Intravenous Q M,W,F-HD   famotidine  20 mg Per Tube QHS   feeding supplement (PROSource TF)  45 mL Per Tube BID   free water  200 mL Per Tube Q4H   heparin injection (subcutaneous)  5,000 Units Subcutaneous Q8H   insulin aspart  0-20 Units Subcutaneous Q4H   insulin aspart  6 Units Subcutaneous Q4H   insulin detemir  30 Units Subcutaneous BID   mouth rinse  15 mL Mouth Rinse q12n4p   metoprolol tartrate  50 mg Per Tube BID   multivitamin  1 tablet Per Tube QHS   nutrition supplement (JUVEN)  1 packet Per Tube BID BM   Continuous Infusions:  feeding supplement  (NEPRO CARB STEADY) 50 mL/hr  at 07/19/21 1500   fluconazole (DIFLUCAN) IV Stopped (07/20/21 0117)   piperacillin-tazobactam (ZOSYN)  IV 3.375 g (07/20/21 0501)     LOS: 23 days    Time spent: 36 minutes    Sharen Hones, MD [facility]   To contact the attending provider between 7A-7P or the covering provider during after hours 7P-7A, please log into the web site [email or web address] and access using universal [place] password for that web site. If you do not have the password, please call the hospital operator.  07/20/2021, 10:02 AM

## 2021-07-20 NOTE — Progress Notes (Signed)
[facility]  ROUNDING NOTE   Subjective:   Heather Lopez is a 75 y.o. white female who has been admitted for community acquired pneumonia.    Hospital course complicated by duodenal perforation s/p repair on 06/30/2021 Requiring hemodialysis.  Left obstructive hydronephrosis and renal abscess, Patient underwent cystoscopy and b/l ureteral stent placement 8/10  Hemodialysis treatment yesterday. Tolerated treatment well. No ultrafiltration.   Objective:  Vital signs in last 24 hours:  Temp:  [98.3 F (36.8 C)-100.8 F (38.2 C)] 100.4 F (38 C) (08/25 0300) Pulse Rate:  [94-113] 113 (08/25 0700) Resp:  [19-39] 32 (08/25 0700) BP: (89-177)/(50-67) 145/57 (08/25 0700) SpO2:  [93 %-99 %] 96 % (08/25 0700) FiO2 (%):  [40 %-52 %] 40 % (08/24 2331)  Weight change:  Filed Weights   07/14/21 0500 07/17/21 0500 07/18/21 0500  Weight: 114 kg 125.5 kg 125.9 kg    Intake/Output: I/O last 3 completed shifts: In: 4148.8 [I.V.:793; Other:300; NG/GT:2030; IV Piggyback:1025.8] Out: 1696 [Urine:2500; Drains:5; VELFY:1017]   Intake/Output this shift:  No intake/output data recorded.  Physical Exam: General: Critically ill  Head: BIPAP  Eyes: Anicteric  Lungs:  +coarse breath sounds  Heart: tachycardia  Abdomen:  +JP drains, midline incision  Extremities:  No  peripheral edema   Neurologic:  Following commands  Skin: No lesions  GU Foley with yellow urine  Access:  LIJ permcath 5/10    Basic Metabolic Panel: Recent Labs  Lab 07/15/21 1036 07/16/21 0444 07/16/21 2217 07/17/21 0434 07/18/21 0511 07/19/21 0449  NA 145 149* 147* 149* 144 141  K 4.0 3.8 3.7 3.6 3.4* 3.2*  CL 104 106 108 111 98 100  CO2 28 28 30 31  32 30  GLUCOSE 208* 149* 124* 109* 232* 251*  BUN 67* 79* 86* 83* 71* 87*  CREATININE 1.97* 1.95* 2.08* 2.02* 1.82* 2.08*  CALCIUM 8.4* 8.9 8.7* 8.8* 9.0 9.1  MG 1.9 2.0  --  2.0 1.9 2.2  PHOS 3.1 3.2  --  2.8 2.7 3.6     Liver Function Tests: Recent  Labs  Lab 07/15/21 1036 07/16/21 2217 07/17/21 0434  AST 59* 50* 52*  ALT 139* 92* 88*  ALKPHOS 124 108 107  BILITOT 0.7 0.4 0.5  PROT 5.8* 5.8* 6.0*  ALBUMIN 1.7* 1.7* 1.8*    No results for input(s): LIPASE, AMYLASE in the last 168 hours. No results for input(s): AMMONIA in the last 168 hours.  CBC: Recent Labs  Lab 07/16/21 2217 07/17/21 0434 07/18/21 0511 07/19/21 0449 07/20/21 0601  WBC 6.3 7.1 7.9 7.8 10.7*  NEUTROABS 4.5  --   --   --   --   HGB 7.9* 8.1* 7.9* 7.8* 8.6*  HCT 25.4* 26.7* 26.2* 25.3* 28.5*  MCV 89.1 87.5 87.3 86.1 91.6  PLT 328 346 379 358 202     Cardiac Enzymes: No results for input(s): CKTOTAL, CKMB, CKMBINDEX, TROPONINI in the last 168 hours.  BNP: Invalid input(s): POCBNP  CBG: Recent Labs  Lab 07/19/21 1126 07/19/21 1617 07/19/21 1925 07/19/21 2352 07/20/21 0354  GLUCAP 198* 324* 269* 222* 209*     Microbiology: Results for orders placed or performed during the hospital encounter of 06/27/21  Resp Panel by RT-PCR (Flu A&B, Covid) Nasopharyngeal Swab     Status: None   Collection Time: 06/27/21 12:32 AM   Specimen: Nasopharyngeal Swab; Nasopharyngeal(NP) swabs in vial transport medium  Result Value Ref Range Status   SARS Coronavirus 2 by RT PCR NEGATIVE NEGATIVE Final  Comment: (NOTE) SARS-CoV-2 target nucleic acids are NOT DETECTED.  The SARS-CoV-2 RNA is generally detectable in upper respiratory specimens during the acute phase of infection. The lowest concentration of SARS-CoV-2 viral copies this assay can detect is 138 copies/mL. A negative result does not preclude SARS-Cov-2 infection and should not be used as the sole basis for treatment or other patient management decisions. A negative result may occur with  improper specimen collection/handling, submission of specimen other than nasopharyngeal swab, presence of viral mutation(s) within the areas targeted by this assay, and inadequate number of  viral copies(<138 copies/mL). A negative result must be combined with clinical observations, patient history, and epidemiological information. The expected result is Negative.  Fact Sheet for Patients:  EntrepreneurPulse.com.au  Fact Sheet for Healthcare Providers:  IncredibleEmployment.be  This test is no t yet approved or cleared by the [place] FDA and  has been authorized for detection and/or diagnosis of SARS-CoV-2 by FDA under an Emergency Use Authorization (EUA). This EUA will remain  in effect (meaning this test can be used) for the duration of the COVID-19 declaration under Section 564(b)(1) of the Act, 21 U.S.C.section 360bbb-3(b)(1), unless the authorization is terminated  or revoked sooner.       Influenza A by PCR NEGATIVE NEGATIVE Final   Influenza B by PCR NEGATIVE NEGATIVE Final    Comment: (NOTE) The Xpert Xpress SARS-CoV-2/FLU/RSV plus assay is intended as an aid in the diagnosis of influenza from Nasopharyngeal swab specimens and should not be used as a sole basis for treatment. Nasal washings and aspirates are unacceptable for Xpert Xpress SARS-CoV-2/FLU/RSV testing.  Fact Sheet for Patients: EntrepreneurPulse.com.au  Fact Sheet for Healthcare Providers: IncredibleEmployment.be  This test is not yet approved or cleared by the [place] FDA and has been authorized for detection and/or diagnosis of SARS-CoV-2 by FDA under an Emergency Use Authorization (EUA). This EUA will remain in effect (meaning this test can be used) for the duration of the COVID-19 declaration under Section 564(b)(1) of the Act, 21 U.S.C. section 360bbb-3(b)(1), unless the authorization is terminated or revoked.  Performed at [facility], [address] [place], [place] [address]   Blood culture (routine x 2)     Status: None   Collection Time: 06/27/21 12:43 AM   Specimen: BLOOD  Result  Value Ref Range Status   Specimen Description BLOOD RIGHT ASSIST CONTROL  Final   Special Requests   Final    BOTTLES DRAWN AEROBIC AND ANAEROBIC Blood Culture results may not be optimal due to an inadequate volume of blood received in culture bottles   Culture   Final    NO GROWTH 5 DAYS Performed at [facility], [address] [place], [place] [address]    Report Status 07/02/2021 FINAL  Final  Blood culture (routine x 2)     Status: None   Collection Time: 06/27/21  1:45 AM   Specimen: BLOOD  Result Value Ref Range Status   Specimen Description BLOOD RIGHT ASSIST CONTROL  Final   Special Requests   Final    BOTTLES DRAWN AEROBIC AND ANAEROBIC Blood Culture adequate volume   Culture   Final    NO GROWTH 5 DAYS Performed at [facility], [address], [place], [place] [address]    Report Status 07/02/2021 FINAL  Final  Urine Culture     Status: Abnormal   Collection Time: 06/27/21  2:39 AM   Specimen: In/Out Cath Urine  Result Value Ref Range Status  Specimen Description   Final    IN/OUT CATH URINE Performed at [facility], [address] [place], [place] [address]    Special Requests   Final    NONE Performed at [facility], [place], [place] [address]    Culture 30,000 COLONIES/mL YEAST (A)  Final   Report Status 06/28/2021 FINAL  Final  MRSA Next Gen by PCR, Nasal     Status: None   Collection Time: 06/30/21  5:01 AM   Specimen: Nasal Mucosa; Nasal Swab  Result Value Ref Range Status   MRSA by PCR Next Gen NOT DETECTED NOT DETECTED Final    Comment: (NOTE) The GeneXpert MRSA Assay (FDA approved for NASAL specimens only), is one component of a comprehensive MRSA colonization surveillance program. It is not intended to diagnose MRSA infection nor to guide or monitor treatment for MRSA infections. Test performance is not FDA approved in patients less than 52 years old. Performed at [facility],  [address] [place], [place] [address]   Urine Culture     Status: Abnormal   Collection Time: 07/05/21 10:36 AM   Specimen: PATH Other; Urine  Result Value Ref Range Status   Specimen Description   Final    URINE, RANDOM LEFT RENAL PELVIS URINE Performed at [facility], [address], [place], [place] [address]    Special Requests   Final    NONE Performed at [facility], [address] [place], [place] [address]    Culture 70,000 [place] (A)  Final   Report Status 07/12/2021 FINAL  Final  Urine Culture     Status: Abnormal   Collection Time: 07/05/21 10:48 AM   Specimen: PATH Other; Urine  Result Value Ref Range Status   Specimen Description   Final    URINE, RANDOM RIGHT KIDNEY URINE Performed at [facility], [address] [place], [place] [address]    Special Requests   Final    NONE Performed at [facility], [address], [place], [place] [address]    Culture (A)  Final    >=100,000 COLONIES/mL CANDIDA GLABRATA SEE SEPARATE REPORT Performed at [facility], [place] [address] [place], [place] [address]    Report Status 07/17/2021 FINAL  Final  Antifungal AST 9 Drug Panel     Status: None   Collection Time: 07/05/21 10:48 AM  Result Value Ref Range Status   Organism ID, Yeast Candida glabrata  Corrected    Comment: (NOTE) Identification performed by account, not confirmed by this laboratory. CORRECTED ON 08/21 AT 1535: PREVIOUSLY REPORTED AS Preliminary report    Amphotericin B MIC 0.5 ug/mL  Final    Comment: (NOTE) Breakpoints have been established for only some organism-drug combinations as indicated. This test was developed and its performance characteristics determined by [facility]. It has not been cleared or approved by the Food and Drug Administration.    Anidulafungin MIC Comment  Final    Comment: (NOTE) 0.03 ug/mL Susceptible Breakpoints have been established for only some  organism-drug combinations as indicated. This test was developed and its performance characteristics determined by [facility]. It has not been cleared or approved by the Food and Drug Administration.    Caspofungin MIC Comment  Final    Comment: (NOTE) 0.06 ug/mL Susceptible Breakpoints have been established for only some organism-drug combinations as indicated. This test was developed and its performance characteristics determined by [facility]. It has not been cleared  or approved by the Food and Drug Administration.    Micafungin MIC Comment  Final    Comment: (NOTE) 0.016 ug/mL Susceptible Breakpoints have been established for only some organism-drug combinations as indicated. This test was developed and its performance characteristics determined by [facility]. It has not been cleared or approved by the Food and Drug Administration.    Posaconazole MIC 0.25 ug/mL  Final    Comment: (NOTE) Breakpoints have been established for only some organism-drug combinations as indicated. This test was developed and its performance characteristics determined by [facility]. It has not been cleared or approved by the Food and Drug Administration.    Fluconazole Islt MIC 2.0 ug/mL  Final    Comment: (NOTE) Susceptible Dose Dependent Breakpoints have been established for only some organism-drug combinations as indicated. This test was developed and its performance characteristics determined by [facility]. It has not been cleared or approved by the Food and Drug Administration.    Flucytosine MIC 0.06 ug/mL or less  Final    Comment: (NOTE) Breakpoints have been established for only some organism-drug combinations as indicated. This test was developed and its performance characteristics determined by [facility]. It has not been cleared or approved by the Food and Drug Administration.    Itraconazole MIC 0.12 ug/mL  Final    Comment: (NOTE) Breakpoints have been established for only some  organism-drug combinations as indicated. This test was developed and its performance characteristics determined by [facility]. It has not been cleared or approved by the Food and Drug Administration.    Voriconazole MIC 0.06 ug/mL  Final    Comment: (NOTE) Breakpoints have been established for only some organism-drug combinations as indicated. This test was developed and its performance characteristics determined by [facility]. It has not been cleared or approved by the Food and Drug Administration. Performed At: [facility] [address] [place], [place] [id number] Rush Farmer MD DV:[id number]    Source CANDIDA GLABRATA SUSCEPTIBILITY URINE CULTURE  Final    Comment: Performed at [facility], [place] [address], [place], [place] [address]  CULTURE, BLOOD (ROUTINE X 2) w Reflex to ID Panel     Status: None   Collection Time: 07/15/21  7:57 PM   Specimen: BLOOD  Result Value Ref Range Status   Specimen Description BLOOD LEFT ASSIST CONTROL  Final   Special Requests   Final    BOTTLES DRAWN AEROBIC AND ANAEROBIC Blood Culture adequate volume   Culture   Final    NO GROWTH 5 DAYS Performed at [facility], [address] [place], [place] [address]    Report Status 07/20/2021 FINAL  Final  CULTURE, BLOOD (ROUTINE X 2) w Reflex to ID Panel     Status: None   Collection Time: 07/15/21  8:07 PM   Specimen: BLOOD  Result Value Ref Range Status   Specimen Description BLOOD LEFT FOREARM  Final   Special Requests   Final    BOTTLES DRAWN AEROBIC AND ANAEROBIC Blood Culture adequate volume   Culture   Final    NO GROWTH 5 DAYS Performed at [facility], [address] [place], [place] [address]    Report Status 07/20/2021 FINAL  Final    Coagulation Studies: No results for input(s): LABPROT, INR in the last 72 hours.    Urinalysis: Recent Labs    07/18/21 1330  COLORURINE YELLOW*  LABSPEC 1.014  PHURINE 5.0  GLUCOSEU [place]  100*  NITRITE NEGATIVE  LEUKOCYTESUR SMALL*        Imaging: DG Chest Port 1 View  Result Date: 07/18/2021 CLINICAL DATA:  75 year old female with respiratory distress, shortness of breath. EXAM: PORTABLE CHEST 1 VIEW COMPARISON:  07/16/2021 and earlier. FINDINGS: Portable AP semi upright view at 0939 hours. No endotracheal tube identified. Enteric tube courses to the left abdomen, tip not included. Stable left chest dual lumen dialysis type catheter. Mildly improved lung volumes and mediastinal contours. No pneumothorax, pulmonary edema, pleural effusion or consolidation. Mild streaky opacity at both lung bases most resembles atelectasis, also improved. Paucity of bowel gas in the upper abdomen. Chronic right midshaft clavicle deformity. No acute osseous abnormality identified. IMPRESSION: Larger lung volumes with regressed but not resolved lung base atelectasis. No new cardiopulmonary abnormality. Electronically Signed   By: Genevie Monya M.D.   On: 07/18/2021 10:25     Medications:    feeding supplement (NEPRO CARB STEADY) 50 mL/hr at 07/19/21 1500   fluconazole (DIFLUCAN) IV Stopped (07/20/21 0117)   piperacillin-tazobactam (ZOSYN)  IV 3.375 g (07/20/21 0501)    amLODipine  10 mg Per Tube Daily   chlorhexidine  15 mL Mouth Rinse BID   Chlorhexidine Gluconate Cloth  6 each Topical Q0600   cloNIDine  0.1 mg Per Tube Daily   epoetin (EPOGEN/PROCRIT) injection  10,000 Units Intravenous Q M,W,F-HD   famotidine  20 mg Per Tube QHS   feeding supplement (PROSource TF)  45 mL Per Tube BID   free water  200 mL Per Tube Q4H   heparin injection (subcutaneous)  5,000 Units Subcutaneous Q8H   insulin aspart  0-20 Units Subcutaneous Q4H   insulin aspart  6 Units Subcutaneous Q4H   insulin detemir  30 Units Subcutaneous BID   mouth rinse  15 mL Mouth Rinse q12n4p   metoprolol tartrate  50 mg Per Tube BID   multivitamin  1 tablet Per Tube QHS    nutrition supplement (JUVEN)  1 packet Per Tube BID BM     Assessment/ Plan:  Heather Lopez is a 75 y.o. white female with diabetes mellitus type II, hypertension, hyperlipidemia, nephrolithiasis, depression, gout who is admitted to [facility] on 06/27/2021 for Hyperkalemia [E87.5] Hyponatremia [E87.1] ARF (acute renal failure) (HCC) [N17.9] Acute renal failure (ARF) (HCC) [N17.9] Acute respiratory failure with hypoxia ([place]) [J96.01] Demand ischemia of myocardium (HCC) [I24.8] Acute respiratory failure with hypoxemia (HCC) [J96.01] Acute renal failure superimposed on chronic kidney disease, unspecified CKD stage, unspecified acute renal failure type ([place]) [N17.9, N18.9] Sepsis, due to unspecified organism, unspecified whether acute organ dysfunction present [facility]) [A41.9]  Acute Kidney Injury on chronic kidney disease stage IV with baseline creatinine 1.97 and GFR of 25 on 12/22/19.Continues to be dialysis dependent despite nonoliguric urine output. Acute kidney injury secondary to sepsis and obstructive uropathy. Chronic kidney disease is secondary to diabetic nephropathy.Nonoliguric urine output.  - Continue to hold losartan and metformin - Plan on intermittent hemodialysis three times a week. Last treatment was yesterday. Next treatment scheduled for tomorrow.   Lab Results  Component Value Date   CREATININE 2.08 (H) 07/19/2021   CREATININE 1.82 (H) 07/18/2021   CREATININE 2.02 (H) 07/17/2021    Intake/Output Summary (Last 24 hours) at 07/20/2021 0714 Last data filed at 07/20/2021 0600 Gross per 24 hour  Intake 2646.28 ml  Output 2405 ml  Net 241.28 ml    2. Acute Respiratory failure secondary to community acquired pneumonia failing outpatient antibiotics. Febrile.  - Continue  supplement oxygen.  - IV pip/tazo and fluconazole - 8/20 blood cultures with no growth    3. Anemia of chronic kidney disease Normocytic Lab Results  Component Value Date   HGB 8.6 (L) 07/20/2021   - EPO  on MWF HD treatments.   4.  Diabetes mellitus type II with chronic kidney disease insulin dependent. Most recent hemoglobin A1c is 8.3% on 06/12/21.  Metformin held    5.Bilateral Nephrolithiasis- with obstructive uropathy and left renal abscess  5. Hypertension with tachycardia:  - Continue clonidine, amlodipine, and metoprolol.    6. Urinary tract infection: candida glabrata.   - fluconazole. Appreciate critical care and ID input.   8. Hypokalemia: 3K bath on dialysis.  - Continue potassium supplementation     LOS: 23 Erin Uecker 8/25/20227:14 AM

## 2021-07-20 NOTE — Progress Notes (Signed)
Pt was transported to CT from CCU and back while on the BIPAP.

## 2021-07-21 ENCOUNTER — Inpatient Hospital Stay: Payer: Medicare Other

## 2021-07-21 DIAGNOSIS — J9601 Acute respiratory failure with hypoxia: Secondary | ICD-10-CM | POA: Diagnosis not present

## 2021-07-21 DIAGNOSIS — N151 Renal and perinephric abscess: Secondary | ICD-10-CM | POA: Diagnosis not present

## 2021-07-21 LAB — CBC WITH DIFFERENTIAL/PLATELET
Abs Immature Granulocytes: 0.3 10*3/uL — ABNORMAL HIGH (ref 0.00–0.07)
Basophils Absolute: 0 10*3/uL (ref 0.0–0.1)
Basophils Relative: 0 %
Eosinophils Absolute: 0.2 10*3/uL (ref 0.0–0.5)
Eosinophils Relative: 1 %
HCT: 23.5 % — ABNORMAL LOW (ref 36.0–46.0)
Hemoglobin: 7.4 g/dL — ABNORMAL LOW (ref 12.0–15.0)
Immature Granulocytes: 2 %
Lymphocytes Relative: 7 %
Lymphs Abs: 1.2 10*3/uL (ref 0.7–4.0)
MCH: 27 pg (ref 26.0–34.0)
MCHC: 31.5 g/dL (ref 30.0–36.0)
MCV: 85.8 fL (ref 80.0–100.0)
Monocytes Absolute: 0.6 10*3/uL (ref 0.1–1.0)
Monocytes Relative: 4 %
Neutro Abs: 13.2 10*3/uL — ABNORMAL HIGH (ref 1.7–7.7)
Neutrophils Relative %: 86 %
Platelets: 303 10*3/uL (ref 150–400)
RBC: 2.74 MIL/uL — ABNORMAL LOW (ref 3.87–5.11)
RDW: 15.5 % (ref 11.5–15.5)
WBC: 15.4 10*3/uL — ABNORMAL HIGH (ref 4.0–10.5)
nRBC: 0.3 % — ABNORMAL HIGH (ref 0.0–0.2)

## 2021-07-21 LAB — BASIC METABOLIC PANEL
Anion gap: 10 (ref 5–15)
BUN: 60 mg/dL — ABNORMAL HIGH (ref 8–23)
CO2: 28 mmol/L (ref 22–32)
Calcium: 8.9 mg/dL (ref 8.9–10.3)
Chloride: 99 mmol/L (ref 98–111)
Creatinine, Ser: 2.35 mg/dL — ABNORMAL HIGH (ref 0.44–1.00)
GFR, Estimated: 21 mL/min — ABNORMAL LOW (ref >60)
Glucose, Bld: 223 mg/dL — ABNORMAL HIGH (ref 70–99)
Potassium: 2.9 mmol/L — ABNORMAL LOW (ref 3.5–5.1)
Sodium: 137 mmol/L (ref 135–145)

## 2021-07-21 LAB — HEPATIC FUNCTION PANEL
ALT: 53 U/L — ABNORMAL HIGH (ref 0–44)
AST: 33 U/L (ref 15–41)
Albumin: 1.6 g/dL — ABNORMAL LOW (ref 3.5–5.0)
Alkaline Phosphatase: 101 U/L (ref 38–126)
Bilirubin, Direct: <0.1 mg/dL (ref 0.0–0.2)
Total Bilirubin: 0.5 mg/dL (ref 0.3–1.2)
Total Protein: 5.8 g/dL — ABNORMAL LOW (ref 6.5–8.1)

## 2021-07-21 LAB — GLUCOSE, CAPILLARY
Glucose-Capillary: 218 mg/dL — ABNORMAL HIGH (ref 70–99)
Glucose-Capillary: 200 mg/dL — ABNORMAL HIGH (ref 70–99)
Glucose-Capillary: 134 mg/dL — ABNORMAL HIGH (ref 70–99)
Glucose-Capillary: 151 mg/dL — ABNORMAL HIGH (ref 70–99)
Glucose-Capillary: 180 mg/dL — ABNORMAL HIGH (ref 70–99)
Glucose-Capillary: 203 mg/dL — ABNORMAL HIGH (ref 70–99)

## 2021-07-21 LAB — MAGNESIUM: Magnesium: 1.9 mg/dL (ref 1.7–2.4)

## 2021-07-21 LAB — CORTISOL-AM, BLOOD: Cortisol - AM: 21.3 ug/dL (ref 6.7–22.6)

## 2021-07-21 MED ORDER — SODIUM CHLORIDE 0.9 % IV BOLUS FOR AMBISOME
500.0000 mL | INTRAVENOUS | Status: DC
  Administered 2021-07-21 – 2021-07-30 (×9): 500 mL via INTRAVENOUS

## 2021-07-21 MED ORDER — ACETAMINOPHEN 325 MG PO TABS: 650.0000 mg | ORAL_TABLET | Freq: Every day | ORAL | Status: DC | PRN

## 2021-07-21 MED ORDER — DEXTROSE 5% FOR FLUSHING BEFORE AND AFTER AMBISOME
10.0000 mL | INTRAVENOUS | Status: AC
  Administered 2021-07-22 – 2021-08-03 (×15): 10 mL via INTRAVENOUS
  Filled 2021-07-21: qty 10
  Filled 2021-07-21 (×2): qty 50
  Filled 2021-07-21 (×4): qty 10
  Filled 2021-07-21: qty 50
  Filled 2021-07-21 (×7): qty 10

## 2021-07-21 MED ORDER — PIPERACILLIN-TAZOBACTAM 3.375 G IVPB 30 MIN
3.3750 g | Freq: Four times a day (QID) | INTRAVENOUS | Status: DC
  Administered 2021-07-22 – 2021-07-24 (×10): 3.375 g via INTRAVENOUS
  Filled 2021-07-21 (×21): qty 50

## 2021-07-21 MED ORDER — DEXTROSE 5 % IV SOLN
0.3000 mg/kg | INTRAVENOUS | Status: DC
  Administered 2021-07-21 – 2021-08-02 (×13): 40 mg via INTRAVENOUS
  Filled 2021-07-21 (×15): qty 40

## 2021-07-21 MED ORDER — ACETAMINOPHEN 160 MG/5ML PO SOLN
650.0000 mg | ORAL | Status: AC
  Administered 2021-07-21 – 2021-08-02 (×13): 650 mg
  Filled 2021-07-21 (×13): qty 20.3

## 2021-07-21 MED ORDER — SODIUM CHLORIDE 0.9 % IV BOLUS FOR AMBISOME
500.0000 mL | INTRAVENOUS | Status: DC
  Administered 2021-07-22 – 2021-07-31 (×10): 500 mL via INTRAVENOUS

## 2021-07-21 MED ORDER — DEXTROSE 5% FOR FLUSHING BEFORE AND AFTER AMBISOME
10.0000 mL | INTRAVENOUS | Status: AC
Start: 2021-07-21 — End: 2021-08-02
  Administered 2021-07-21 – 2021-08-02 (×13): 10 mL via INTRAVENOUS
  Filled 2021-07-21 (×3): qty 10
  Filled 2021-07-21: qty 50
  Filled 2021-07-21 (×2): qty 10
  Filled 2021-07-21: qty 50
  Filled 2021-07-21: qty 10
  Filled 2021-07-21 (×3): qty 50
  Filled 2021-07-21 (×3): qty 10
  Filled 2021-07-21 (×2): qty 50

## 2021-07-21 MED ORDER — MEPERIDINE HCL 25 MG/ML IJ SOLN: 25.0000 mg | INTRAMUSCULAR | Status: DC | PRN

## 2021-07-21 MED ORDER — ACETAMINOPHEN 160 MG/5ML PO SOLN
650.0000 mg | ORAL | Status: DC
  Filled 2021-07-21: qty 20.3

## 2021-07-21 MED ORDER — SODIUM CHLORIDE 0.9 % IV SOLN
300.0000 mg | Freq: Once | INTRAVENOUS | Status: AC
  Administered 2021-07-21: 300 mg via INTRAVENOUS
  Filled 2021-07-21: qty 15

## 2021-07-21 MED ORDER — SODIUM CHLORIDE 0.9% FLUSH
5.0000 mL | Freq: Three times a day (TID) | INTRAVENOUS | Status: DC
Start: 2021-07-21 — End: 2021-07-31
  Administered 2021-07-22 – 2021-07-31 (×21): 5 mL

## 2021-07-21 MED ORDER — DIPHENHYDRAMINE HCL 50 MG/ML IJ SOLN: 25.0000 mg | Freq: Every day | INTRAMUSCULAR | Status: DC | PRN

## 2021-07-21 MED ORDER — DIPHENHYDRAMINE HCL 12.5 MG/5ML PO ELIX
25.0000 mg | ORAL_SOLUTION | Freq: Every day | ORAL | Status: DC | PRN
  Filled 2021-07-21: qty 10

## 2021-07-21 NOTE — Progress Notes (Signed)
Patient here today for Abscess drain placement per Dr Denna Haggard, critially ill on HFNC oxygen, unresponsive with CCu nurse Elder Negus RN,monitoring status as  well as vitals. Only local anesthetic will be used for procedure.

## 2021-07-21 NOTE — TOC Progression Note (Signed)
Transition of Care [facility]) - Progression Note    Patient Details  Name: HEER JUSTISS MRN: [id number] Date of Birth: 1946-08-14  Transition of Care [facility]) CM/SW [place], [place] Phone Number: 07/21/2021, 8:42 AM  Clinical Narrative:     CSW left voicemail for Syriah, Delisi (Spouse) [phone] [facility] Phone) requesting an update on which LTACH facility he has chosen.  Expected Discharge Plan: [place] Barriers to Discharge: Continued Medical Work up  Expected Discharge Plan and Services Expected Discharge Plan: [place] In-house Referral: Clinical Social Work   Post Acute Care Choice: [place] Living arrangements for the past 2 months: Single Family Home                                       Social Determinants of Health (SDOH) Interventions    Readmission Risk Interventions No flowsheet data found.

## 2021-07-21 NOTE — Progress Notes (Signed)
Speech Language Pathology Treatment: Dysphagia  Patient Details Name: Heather Lopez MRN: [id number] DOB: [birth year] Today's Date: 07/21/2021 Time: 0092-3300 SLP Time Calculation (min) (ACUTE ONLY): 45 min  Assessment / Plan / Recommendation Clinical Impression  Pt seen for ongoing assessment of swallowing and toleration of therapeutic/Pleasure trials of ice chips. Pt continues to have NGT for TFs and presents w/ severely deconditioned status and Pulmonary function. She remains on HFNC at 10L, 40% fio2. Baseline RR is elevated in the mid20s at rest. Pt exhibited declined Cognitive functioning yesterday so tx session was not held. NSG reported improved Cognition today s/p HD this morning.   Pt continues to present w/ potential oropharyngeal phase dysphagia w/ risk for aspiration in light of severe illness and deconditioning; also, pt has declined Cognitive engagement/awareness intermittently. She has reduced Pulmonary function requiring increased O2 support. Pt currently has an NGT for TFs. She is on HD support 3x weekly.  Pt demonstrated more alertness at this tx session; verbal engagement and answering questions and following basic instructions w/ min cues. During po trials of ice chips then TSPs of water(thin liquid) post oral care, pt exhibited adequate oral prep awareness, fair-good oral phase time w/ adequate oral/lingual organization for bolus management -- oral phase was appropriate for trial consistencies given. Pt required min verbal/visual stim to follow through w/ po tasks. Pt cleared orally; pharyngeal swallowing(single swallows) appreciated. No increased coughing noted during/post trials. Family arrived at end of session; thorough education given on aspiration precautions and POC for therapeutic/Pleasure po's of ice chips and TSPs of water post oral care as posted.   Recommend Pleasure and Therapeutic single ice chip trials post thorough oral care for hygiene and stimulation of swallowing.  Recommend strict aspiration precautions including head forward and must be alert to engage in tasks given cues.   ST services will continue to follow w/ ongoing assessment and appropriateness to upgrade to trials of thin liquids and foods as pt's Cognitive engagement and respiratory/medical status' continue to improve. Any upgrade to an oral diet may occur at next venue of care based on pt's presenting medical/Pulmonary illness/status. NSG/MD and Husband/Dtr updated, agreed. Precautions posted in room.      Heather Lopez Heather Lopez: Pt is a [name] who presented to the ED on 06/27/21 with chief complaints of progressive shortness of breath, productive cough, loss of appetite, nausea, dry heaving fatigue, malaise and generalized body aches. Patient initially went to her PCP on 7/29 with complaints of rhinorrhea, harsh cough, loss of appetite, nausea, fatigue, malaise and generalized body aches x2 days, patient was diagnosed with pneumonia of which she received ceftriaxone 1 mg IM and sent home with Levaquin, prednisone and Tessalon. On 8/4 pt developed acute abdominal pain with CT showing viscus perforation. Pt urgently taken to the OR for ex lap. Pt is also s/p cystoscopy with B ureteral stent placement for L obstructed kidney stone on 8/10. Pt was orally intubated until 07/14/2021.  CXR: 07/18/21 - Larger lung volumes with regressed but not resolved lung base  atelectasis. No new cardiopulmonary abnormality.      SLP Plan  Continue with current plan of care (therapeutic po trials and pleasure po trials of single ice chips, tsps of water post oral care)       Recommendations  Diet recommendations: NPO Medication Administration: Via alternative means (NGT)                General recommendations:  (Dietician following; Palliative Care f/u for [place]) Oral Care Recommendations:  Oral care QID;Oral care prior to ice chip/H20;Staff/trained caregiver to provide oral care Follow up Recommendations: Skilled Nursing  facility (TBD) SLP Visit Diagnosis: Dysphagia, oropharyngeal phase (R13.12) (deconditioning and Cognitive decline present) Plan: Continue with current plan of care (therapeutic po trials and pleasure po trials of single ice chips, tsps of water post oral care)       GO                 Heather Lopez, [place], CCC-SLP Speech Language Pathologist Rehab Services [phone] [facility] 07/21/2021, 3:44 PM

## 2021-07-21 NOTE — Progress Notes (Signed)
ID Pt is doing better today More alert Talking a bit Resp status better  Patient Vitals for the past 24 hrs:  BP Temp Temp src Pulse Resp SpO2  07/21/21 1503 (!) 115/54 -- -- 90 (!) 37 97 %  07/21/21 1337 -- -- -- 91 (!) 27 98 %  07/21/21 1319 -- -- -- 92 (!) 34 94 %  07/21/21 1300 (!) 144/54 -- -- 96 (!) 29 91 %  07/21/21 1200 (!) 152/54 99.1 F (37.3 C) -- 98 (!) 31 95 %  07/21/21 1145 (!) 150/61 -- -- 99 (!) 30 95 %  07/21/21 1130 (!) 157/61 -- -- 99 18 96 %  07/21/21 1115 (!) 142/58 -- -- 98 (!) 30 97 %  07/21/21 1100 (!) 136/58 -- -- 98 (!) 33 95 %  07/21/21 1045 (!) 141/62 -- -- 98 (!) 28 98 %  07/21/21 1030 (!) 143/58 -- -- 100 (!) 26 98 %  07/21/21 1015 136/68 -- -- 100 (!) 28 99 %  07/21/21 1000 (!) 140/57 -- -- 99 (!) 27 98 %  07/21/21 0945 (!) 141/57 -- -- 100 (!) 31 97 %  07/21/21 0930 (!) 143/54 -- -- (!) 101 (!) 27 98 %  07/21/21 0915 (!) 144/53 -- -- 97 (!) 32 98 %  07/21/21 0900 (!) 143/48 98.8 F (37.1 C) Axillary 91 (!) 28 96 %  07/21/21 0815 -- -- -- 81 16 96 %  07/21/21 0800 (!) 147/48 -- -- 88 17 97 %  07/21/21 0758 -- -- -- 87 17 97 %  07/21/21 0745 -- -- -- 89 17 96 %  07/21/21 0730 -- -- -- 88 (!) 7 95 %  07/21/21 0715 -- -- -- 86 (!) 25 96 %  07/21/21 0700 (!) 147/50 99.7 F (37.6 C) -- 87 (!) 0 95 %  07/21/21 0600 (!) 145/47 -- -- 83 (!) 26 95 %  07/21/21 0500 (!) 157/55 -- -- 86 (!) 7 95 %  07/21/21 0400 (!) 138/48 99.1 F (37.3 C) -- 76 (!) 21 91 %  07/21/21 0300 (!) 131/55 -- -- 90 (!) 23 94 %  07/21/21 0200 (!) 129/49 -- -- 77 (!) 24 97 %  07/21/21 0100 (!) 144/54 -- -- 78 (!) 27 93 %  07/21/21 0000 (!) 150/54 -- -- 80 (!) 22 96 %  07/20/21 2343 -- 99.2 F (37.3 C) Axillary -- -- --  07/20/21 2300 (!) 140/54 -- -- 79 19 94 %  07/20/21 2200 (!) 139/51 -- -- 76 (!) 31 94 %  07/20/21 2100 (!) 135/50 -- -- (!) 103 (!) 32 93 %  07/20/21 2032 -- (!) 100.5 F (38.1 C) Axillary -- -- --  07/20/21 2000 (!) 158/51 -- -- (!) 104 (!) 40 96 %   07/20/21 1934 -- -- -- -- -- 96 %  07/20/21 1926 -- (!) 102.3 F (39.1 C) Axillary -- -- --  07/20/21 1900 (!) 165/58 -- -- (!) 109 (!) 23 93 %  07/20/21 1600 (!) 157/59 (!) 100.4 F (38 C) -- (!) 108 20 96 %    Awake NG tube Nasal oxygen Chest b/l air entry Hs irregular Abd - lap surgically site healed well Foley Rectal tube  Labs CBC Latest Ref Rng & Units 07/21/2021 07/20/2021 07/19/2021  WBC 4.0 - 10.5 K/uL 15.4(H) 10.7(H) 7.8  Hemoglobin 12.0 - 15.0 g/dL 7.4(L) 8.6(L) 7.8(L)  Hematocrit 36.0 - 46.0 % 23.5(L) 28.5(L) 25.3(L)  Platelets 150 - 400 K/uL 303 202 358  CMP Latest Ref Rng & Units 07/21/2021 07/20/2021 07/19/2021  Glucose 70 - 99 mg/dL 223(H) 190(H) 251(H)  BUN 8 - 23 mg/dL 60(H) 55(H) 87(H)  Creatinine 0.44 - 1.00 mg/dL 2.35(H) 2.04(H) 2.08(H)  Sodium 135 - 145 mmol/L 137 136 141  Potassium 3.5 - 5.1 mmol/L 2.9(L) 4.1 3.2(L)  Chloride 98 - 111 mmol/L 99 99 100  CO2 22 - 32 mmol/L 28 25 30   Calcium 8.9 - 10.3 mg/dL 8.9 8.9 9.1  Total Protein 6.5 - 8.1 g/dL 5.8(L) - -  Total Bilirubin 0.3 - 1.2 mg/dL 0.5 - -  Alkaline Phos 38 - 126 U/L 101 - -  AST 15 - 41 U/L 33 - -  ALT 0 - 44 U/L 53(H) - -    CT abdomen- renal abscess, perirenal abscess left kidney   Impression/recommendation  Fever due to left renal /perirenal abscess- needs drainage and culture Complicated UTI with b/l renal stones. B/l hydronephrosis, b/l stents and urine culture taken  from the kidneys on 07/05/21 during the procedure grew candida glabrata- Fluconazole MIC 2 Has been on high dose fluconazole Repeat CT abdomen showed renal abscess- needs drainage/culture May need ampho B( conventional )deoxycholate( not liposomal as it is not effective in renal infections) depending on the culture result  AKI on CKD  HD  Duodenal perforation s/p surgery  Encephalopathy- fluctuates  Afib with RVR on metoprolol  Acute hypoxic resp failure- had been intubated earlier in the stay- remains extubated  for > 1 week Morbid obesity with hypoventilation syndrome  DM- on insulin   Discussed the management with the care team. ID will follow peripherally this weekend. Call if needed

## 2021-07-21 NOTE — Progress Notes (Signed)
Inpatient Diabetes Program Recommendations  AACE/ADA: New Consensus Statement on Inpatient Glycemic Control (2015)  Target Ranges:  Prepandial:   less than 140 mg/dL      Peak postprandial:   less than 180 mg/dL (1-2 hours)      Critically ill patients:  140 - 180 mg/dL  Results for Heather Lopez, Heather Lopez (MRN [id number]) as of 07/21/2021 10:05  Ref. Range 07/20/2021 23:21 07/21/2021 03:56 07/21/2021 07:34  Glucose-Capillary Latest Ref Range: 70 - 99 mg/dL 184 (H)  10 units Novolog  218 (H)  13 units Novolog  200 (H)  10 units Novolog    Home DM Meds: Novolog 6 units TID with meals + Correction                             Levemir 52 units BID                             Metformin 1000 mg BID                             Trulicity 5.03 mg Qweek   Current Orders: Levemir 30 units BID  Novolog 0-20 units Q4H  Novolog 6 units Q4H     MD- Note Nepro tube feeds running 50cc/hr.  Please consider the following:  1. Increase Levemir slightly to 32 units BID  2. Increase Novolog tube feed coverage to 8 units Q4 hours    --Will follow patient during hospitalization--  Wyn Quaker RN, MSN, CDE Diabetes Coordinator Inpatient Glycemic Control Team Team Pager: [phone] (8a-5p)

## 2021-07-21 NOTE — Progress Notes (Signed)
   Asked to comment on CT findings in this critically ill patient with complex hospitalization.  Admitted with perforated duodenal ulcer, and also found to have an obstructing left ureteral stone and chronic right hydronephrosis and underwent bilateral ureteral stent placement with Dr. Bernardo Heater on 07/05/2021.  Continues to remain hospitalized and spike fevers despite antibiotics.  I personally viewed the CT abdomen pelvis from yesterday that shows a left anterior perirenal fluid collection that may represent abscess, bilateral ureteral stents in appropriate position, and mild to moderate hydronephrosis bilaterally likely secondary to reflux up the stents in the setting of a distended bladder.  Recommend Foley placement for maximal drainage which should resolve the hydronephrosis with stents in appropriate position, and consider interventional radiology consult for drainage of left perirenal fluid collection.  [name], MD

## 2021-07-21 NOTE — Progress Notes (Signed)
[facility]  ROUNDING NOTE   Subjective:   Heather Lopez is a 75 y.o. white female who has been admitted for community acquired pneumonia.    Hospital course complicated by duodenal perforation s/p repair on 06/30/2021 Requiring hemodialysis.  Left obstructive hydronephrosis and renal abscess, Patient underwent cystoscopy and b/l ureteral stent placement 8/10  Hemodialysis treatment for today.   CT with perinephric fluid collection  Tmax 102.3  Amphotericin B ordered but discontinued before administered last night.    Objective:  Vital signs in last 24 hours:  Temp:  [99.1 F (37.3 C)-102.3 F (39.1 C)] 99.7 F (37.6 C) (08/26 0700) Pulse Rate:  [76-109] 87 (08/26 0758) Resp:  [0-40] 17 (08/26 0758) BP: (129-169)/(47-64) 147/50 (08/26 0700) SpO2:  [88 %-98 %] 97 % (08/26 0758) FiO2 (%):  [40 %-45 %] 40 % (08/26 0758)  Weight change:  Filed Weights   07/14/21 0500 07/17/21 0500 07/18/21 0500  Weight: 114 kg 125.5 kg 125.9 kg    Intake/Output: I/O last 3 completed shifts: In: 2558.6 [Other:1000; NG/GT:600; IV Piggyback:958.6] Out: 1448 [Urine:1725; JEHUD:1497]   Intake/Output this shift:  No intake/output data recorded.  Physical Exam: General: Critically ill  Head: BIPAP  Eyes: Anicteric  Lungs:  +coarse breath sounds  Heart: tachycardia  Abdomen:  +JP drains, midline incision  Extremities:  No  peripheral edema   Neurologic:  Following commands  Skin: No lesions  GU Foley with yellow urine  Access:  LIJ permcath 0/26    Basic Metabolic Panel: Recent Labs  Lab 07/16/21 0444 07/16/21 2217 07/17/21 0434 07/18/21 0511 07/19/21 0449 07/20/21 0601 07/21/21 0528  NA 149*   < > 149* 144 141 136 137  K 3.8   < > 3.6 3.4* 3.2* 4.1 2.9*  CL 106   < > 111 98 100 99 99  CO2 28   < > 31 32 30 25 28   GLUCOSE 149*   < > 109* 232* 251* 190* 223*  BUN 79*   < > 83* 71* 87* 55* 60*  CREATININE 1.95*   < > 2.02* 1.82* 2.08* 2.04* 2.35*  CALCIUM 8.9   <  > 8.8* 9.0 9.1 8.9 8.9  MG 2.0  --  2.0 1.9 2.2 1.9 1.9  PHOS 3.2  --  2.8 2.7 3.6 2.7  --    < > = values in this interval not displayed.     Liver Function Tests: Recent Labs  Lab 07/15/21 1036 07/16/21 2217 07/17/21 0434  AST 59* 50* 52*  ALT 139* 92* 88*  ALKPHOS 124 108 107  BILITOT 0.7 0.4 0.5  PROT 5.8* 5.8* 6.0*  ALBUMIN 1.7* 1.7* 1.8*    No results for input(s): LIPASE, AMYLASE in the last 168 hours. No results for input(s): AMMONIA in the last 168 hours.  CBC: Recent Labs  Lab 07/16/21 2217 07/17/21 0434 07/18/21 0511 07/19/21 0449 07/20/21 0601 07/21/21 0528  WBC 6.3 7.1 7.9 7.8 10.7* 15.4*  NEUTROABS 4.5  --   --   --   --  13.2*  HGB 7.9* 8.1* 7.9* 7.8* 8.6* 7.4*  HCT 25.4* 26.7* 26.2* 25.3* 28.5* 23.5*  MCV 89.1 87.5 87.3 86.1 91.6 85.8  PLT 328 346 379 358 202 303     Cardiac Enzymes: No results for input(s): CKTOTAL, CKMB, CKMBINDEX, TROPONINI in the last 168 hours.  BNP: Invalid input(s): POCBNP  CBG: Recent Labs  Lab 07/20/21 1604 07/20/21 1927 07/20/21 2321 07/21/21 0356 07/21/21 0734  GLUCAP 217*  207* 184* 218* 200*     Microbiology: Results for orders placed or performed during the hospital encounter of 06/27/21  Resp Panel by RT-PCR (Flu A&B, Covid) Nasopharyngeal Swab     Status: None   Collection Time: 06/27/21 12:32 AM   Specimen: Nasopharyngeal Swab; Nasopharyngeal(NP) swabs in vial transport medium  Result Value Ref Range Status   SARS Coronavirus 2 by RT PCR NEGATIVE NEGATIVE Final    Comment: (NOTE) SARS-CoV-2 target nucleic acids are NOT DETECTED.  The SARS-CoV-2 RNA is generally detectable in upper respiratory specimens during the acute phase of infection. The lowest concentration of SARS-CoV-2 viral copies this assay can detect is 138 copies/mL. A negative result does not preclude SARS-Cov-2 infection and should not be used as the sole basis for treatment or other patient management decisions. A negative result  may occur with  improper specimen collection/handling, submission of specimen other than nasopharyngeal swab, presence of viral mutation(s) within the areas targeted by this assay, and inadequate number of viral copies(<138 copies/mL). A negative result must be combined with clinical observations, patient history, and epidemiological information. The expected result is Negative.  Fact Sheet for Patients:  EntrepreneurPulse.com.au  Fact Sheet for Healthcare Providers:  IncredibleEmployment.be  This test is no t yet approved or cleared by the [place] FDA and  has been authorized for detection and/or diagnosis of SARS-CoV-2 by FDA under an Emergency Use Authorization (EUA). This EUA will remain  in effect (meaning this test can be used) for the duration of the COVID-19 declaration under Section 564(b)(1) of the Act, 21 U.S.C.section 360bbb-3(b)(1), unless the authorization is terminated  or revoked sooner.       Influenza A by PCR NEGATIVE NEGATIVE Final   Influenza B by PCR NEGATIVE NEGATIVE Final    Comment: (NOTE) The Xpert Xpress SARS-CoV-2/FLU/RSV plus assay is intended as an aid in the diagnosis of influenza from Nasopharyngeal swab specimens and should not be used as a sole basis for treatment. Nasal washings and aspirates are unacceptable for Xpert Xpress SARS-CoV-2/FLU/RSV testing.  Fact Sheet for Patients: EntrepreneurPulse.com.au  Fact Sheet for Healthcare Providers: IncredibleEmployment.be  This test is not yet approved or cleared by the [place] FDA and has been authorized for detection and/or diagnosis of SARS-CoV-2 by FDA under an Emergency Use Authorization (EUA). This EUA will remain in effect (meaning this test can be used) for the duration of the COVID-19 declaration under Section 564(b)(1) of the Act, 21 U.S.C. section 360bbb-3(b)(1), unless the authorization is terminated  or revoked.  Performed at [facility], [address] [place], [place] [address]   Blood culture (routine x 2)     Status: None   Collection Time: 06/27/21 12:43 AM   Specimen: BLOOD  Result Value Ref Range Status   Specimen Description BLOOD RIGHT ASSIST CONTROL  Final   Special Requests   Final    BOTTLES DRAWN AEROBIC AND ANAEROBIC Blood Culture results may not be optimal due to an inadequate volume of blood received in culture bottles   Culture   Final    NO GROWTH 5 DAYS Performed at [facility], [address], [place], [place] [address]    Report Status 07/02/2021 FINAL  Final  Blood culture (routine x 2)     Status: None   Collection Time: 06/27/21  1:45 AM   Specimen: BLOOD  Result Value Ref Range Status   Specimen Description BLOOD RIGHT ASSIST CONTROL  Final   Special Requests   Final  BOTTLES DRAWN AEROBIC AND ANAEROBIC Blood Culture adequate volume   Culture   Final    NO GROWTH 5 DAYS Performed at [facility], [address] [place], [place] [address]    Report Status 07/02/2021 FINAL  Final  Urine Culture     Status: Abnormal   Collection Time: 06/27/21  2:39 AM   Specimen: In/Out Cath Urine  Result Value Ref Range Status   Specimen Description   Final    IN/OUT CATH URINE Performed at [facility], [address], [place], [place] [address]    Special Requests   Final    NONE Performed at [facility], [place], [place] [address]    Culture 30,000 COLONIES/mL YEAST (A)  Final   Report Status 06/28/2021 FINAL  Final  MRSA Next Gen by PCR, Nasal     Status: None   Collection Time: 06/30/21  5:01 AM   Specimen: Nasal Mucosa; Nasal Swab  Result Value Ref Range Status   MRSA by PCR Next Gen NOT DETECTED NOT DETECTED Final    Comment: (NOTE) The GeneXpert MRSA Assay (FDA approved for NASAL specimens only), is one component of a comprehensive MRSA colonization surveillance program. It  is not intended to diagnose MRSA infection nor to guide or monitor treatment for MRSA infections. Test performance is not FDA approved in patients less than 71 years old. Performed at [facility], [address] [place], [place] [address]   Urine Culture     Status: Abnormal   Collection Time: 07/05/21 10:36 AM   Specimen: PATH Other; Urine  Result Value Ref Range Status   Specimen Description   Final    URINE, RANDOM LEFT RENAL PELVIS URINE Performed at [facility], [address] [place], [place] [address]    Special Requests   Final    NONE Performed at [facility], [address] [place], [place] [address]    Culture 70,000 [place] (A)  Final   Report Status 07/12/2021 FINAL  Final  Urine Culture     Status: Abnormal   Collection Time: 07/05/21 10:48 AM   Specimen: PATH Other; Urine  Result Value Ref Range Status   Specimen Description   Final    URINE, RANDOM RIGHT KIDNEY URINE Performed at [facility], [address], [place], [place] [address]    Special Requests   Final    NONE Performed at [facility], [address], [place], [place] [address]    Culture (A)  Final    >=100,000 COLONIES/mL CANDIDA GLABRATA SEE SEPARATE REPORT Performed at [facility], [place] [address], [place], [place] [address]    Report Status 07/17/2021 FINAL  Final  Antifungal AST 9 Drug Panel     Status: None   Collection Time: 07/05/21 10:48 AM  Result Value Ref Range Status   Organism ID, Yeast Candida glabrata  Corrected    Comment: (NOTE) Identification performed by account, not confirmed by this laboratory. CORRECTED ON 08/21 AT 1535: PREVIOUSLY REPORTED AS Preliminary report    Amphotericin B MIC 0.5 ug/mL  Final    Comment: (NOTE) Breakpoints have been established for only some organism-drug combinations as indicated. This test was developed and its performance characteristics determined by  [facility]. It has not been cleared or approved by the Food and Drug Administration.    Anidulafungin MIC Comment  Final    Comment: (NOTE) 0.03 ug/mL Susceptible Breakpoints have been established  for only some organism-drug combinations as indicated. This test was developed and its performance characteristics determined by [facility]. It has not been cleared or approved by the Food and Drug Administration.    Caspofungin MIC Comment  Final    Comment: (NOTE) 0.06 ug/mL Susceptible Breakpoints have been established for only some organism-drug combinations as indicated. This test was developed and its performance characteristics determined by [facility]. It has not been cleared or approved by the Food and Drug Administration.    Micafungin MIC Comment  Final    Comment: (NOTE) 0.016 ug/mL Susceptible Breakpoints have been established for only some organism-drug combinations as indicated. This test was developed and its performance characteristics determined by [facility]. It has not been cleared or approved by the Food and Drug Administration.    Posaconazole MIC 0.25 ug/mL  Final    Comment: (NOTE) Breakpoints have been established for only some organism-drug combinations as indicated. This test was developed and its performance characteristics determined by [facility]. It has not been cleared or approved by the Food and Drug Administration.    Fluconazole Islt MIC 2.0 ug/mL  Final    Comment: (NOTE) Susceptible Dose Dependent Breakpoints have been established for only some organism-drug combinations as indicated. This test was developed and its performance characteristics determined by [facility]. It has not been cleared or approved by the Food and Drug Administration.    Flucytosine MIC 0.06 ug/mL or less  Final    Comment: (NOTE) Breakpoints have been established for only some organism-drug combinations as indicated. This test was developed and its performance  characteristics determined by [facility]. It has not been cleared or approved by the Food and Drug Administration.    Itraconazole MIC 0.12 ug/mL  Final    Comment: (NOTE) Breakpoints have been established for only some organism-drug combinations as indicated. This test was developed and its performance characteristics determined by [facility]. It has not been cleared or approved by the Food and Drug Administration.    Voriconazole MIC 0.06 ug/mL  Final    Comment: (NOTE) Breakpoints have been established for only some organism-drug combinations as indicated. This test was developed and its performance characteristics determined by [facility]. It has not been cleared or approved by the Food and Drug Administration. Performed At: [facility] [address] [place], [place] [id number] Rush Farmer MD WY:[id number]    Source CANDIDA GLABRATA SUSCEPTIBILITY URINE CULTURE  Final    Comment: Performed at [facility], [place] [address] [place], [place] [address]  CULTURE, BLOOD (ROUTINE X 2) w Reflex to ID Panel     Status: None   Collection Time: 07/15/21  7:57 PM   Specimen: BLOOD  Result Value Ref Range Status   Specimen Description BLOOD LEFT ASSIST CONTROL  Final   Special Requests   Final    BOTTLES DRAWN AEROBIC AND ANAEROBIC Blood Culture adequate volume   Culture   Final    NO GROWTH 5 DAYS Performed at [facility], [address] [place], [place] [address]    Report Status 07/20/2021 FINAL  Final  CULTURE, BLOOD (ROUTINE X 2) w Reflex to ID Panel     Status: None   Collection Time: 07/15/21  8:07 PM   Specimen: BLOOD  Result Value Ref Range Status   Specimen Description BLOOD LEFT FOREARM  Final   Special Requests   Final    BOTTLES DRAWN AEROBIC AND ANAEROBIC Blood Culture adequate volume   Culture   Final    NO GROWTH  5 DAYS Performed at [facility] [address] [place], [place] [address]    Report Status 07/20/2021 FINAL  Final     Coagulation Studies: No results for input(s): LABPROT, INR in the last 72 hours.    Urinalysis: Recent Labs    07/18/21 1330  COLORURINE YELLOW*  LABSPEC 1.014  PHURINE 5.0  GLUCOSEU 50*  HGBUR SMALL*  BILIRUBINUR NEGATIVE  KETONESUR NEGATIVE  PROTEINUR 100*  NITRITE NEGATIVE  LEUKOCYTESUR SMALL*        Imaging: CT ABDOMEN PELVIS WO CONTRAST  Result Date: 07/20/2021 CLINICAL DATA:  Abdominal abscess/infection suspected Weakness and acute renal failure. EXAM: CT ABDOMEN AND PELVIS WITHOUT CONTRAST TECHNIQUE: Multidetector CT imaging of the abdomen and pelvis was performed following the standard protocol without IV contrast. COMPARISON:  Noncontrast CT 5 days ago 07/15/2021, additional prior exams reviewed. FINDINGS: Lower chest: Dialysis catheter tip in the right atrium slight improvement in bilateral pleural effusions and bibasilar atelectasis from prior exam. Moderate atelectasis persists. Hepatobiliary: No evidence of focal hepatic lesion on this unenhanced exam. Possible layering small stones or sludge in the gallbladder without pericholecystic inflammation. No common bile duct dilatation. Pancreas: No ductal dilatation or inflammation. Spleen: Normal in size without focal abnormality. Adrenals/Urinary Tract: Mild adrenal thickening without dominant nodule. Right-sided ureteral stent in place. The degree of right hydronephrosis has increased from most recent CT. There is mild right perinephric edema. 12 mm stone in the lower right kidney. No stone or stone fragments along the course of the ureteral stent. The distal pigtail is coiled in the dependent bladder. Left ureteral stent in place. Mild left hydronephrosis which has progressed from prior exam. Multiple irregular stones in the upper left kidney, including curvilinear stone measuring 2.3 cm. Subcapsular collection in the upper right kidney measures approximately 2.7 x 5.9 cm, previously less well-defined. Left anterior  perirenal fluid collection measures approximately 12.3 x 2.2 cm, series 2, image 46. Cranial caudal length of 9.8 cm, with irregular shape and fluid collection coursing in the pericolic gutter and anterior retroperitoneum. Mild adjacent fat stranding. This is not significantly changed from prior exam. Distended urinary bladder. No bladder wall thickening. Stomach/Bowel: Enteric tube tip is in the stomach. The stomach is decompressed. No small bowel obstruction or inflammation. Normal appendix. Fluid/liquid colon in the cecum and ascending colon. More distal colon is nondistended. Occasional sigmoid diverticulosis without diverticulitis. Rectal tube is in place. Vascular/Lymphatic: Moderate aortic atherosclerosis. No aortic aneurysm scattered retroperitoneal lymph nodes are prominent, but not enlarged by size criteria. Reproductive: Anteverted uterus which is heterogeneous with small calcifications suggesting fibroids. Stable appearance from prior. No suspicious adnexal mass. Other: Left anterior perirenal collection which appears to be contiguous with the subcapsular collection about the left kidney as described above. This collection extends in the anterior pararenal space and retroperitoneum with adjacent fat stranding. Distal extent into the pericolic gutter. There is no internal air. Sterility is indeterminate by imaging. Previous right lower quadrant drainage catheter has been removed. No evidence of recurrence right-sided collection. No free air. Soft tissue densities in the anterior abdominal wall typical of medication injection sites. Musculoskeletal: Stable from prior. Degenerative change in the spine. IMPRESSION: 1. Left anterior perirenal fluid collection which appears to be contiguous with the subcapsular collection about the upper left kidney. This collection extends in the anterior pararenal space and retroperitoneum with adjacent fat stranding. This is not significantly changed in size from prior  exam. Sterility is indeterminate by imaging. 2. Right-sided ureteral stent in place. The  degree of right hydronephrosis has increased from most recent CT. Left ureteral stent in place. Mild left hydronephrosis which has progressed from prior exam. 3. Bilateral nonobstructing nephrolithiasis. 4. Fluid/liquid colon in the cecum and ascending colon, can be seen with diarrheal illness. 5. Slight improvement in bilateral pleural effusions and bibasilar atelectasis from prior exam. Aortic Atherosclerosis (ICD10-I70.0). Electronically Signed   By: Keith Rake M.D.   On: 07/20/2021 15:26   DG Chest Port 1 View  Result Date: 07/20/2021 CLINICAL DATA:  Aspiration pneumonia. EXAM: PORTABLE CHEST 1 VIEW COMPARISON:  07/18/2021. FINDINGS: Left IJ dialysis catheter tip is in the right atrium. Nasogastric tube is followed into the stomach with the tip projecting beyond the inferior margin of the image. Trachea is midline. Heart is enlarged, stable. Lungs are somewhat low in volume with mild central pulmonary vascular congestion and streaky bibasilar opacities. No definite pleural fluid. IMPRESSION: Streaky bibasilar opacities may be due to atelectasis. Electronically Signed   By: Lorin Picket M.D.   On: 07/20/2021 13:14     Medications:    feeding supplement (NEPRO CARB STEADY) 50 mL/hr at 07/19/21 1500   fluconazole (DIFLUCAN) IV     piperacillin-tazobactam (ZOSYN)  IV 12.5 mL/hr at 07/21/21 0600    amLODipine  10 mg Per Tube Daily   chlorhexidine  15 mL Mouth Rinse BID   Chlorhexidine Gluconate Cloth  6 each Topical Q0600   cloNIDine  0.1 mg Per Tube Daily   epoetin (EPOGEN/PROCRIT) injection  10,000 Units Intravenous Q M,W,F-HD   famotidine  20 mg Per Tube QHS   feeding supplement (PROSource TF)  45 mL Per Tube BID   free water  200 mL Per Tube Q4H   heparin injection (subcutaneous)  5,000 Units Subcutaneous Q8H   insulin aspart  0-20 Units Subcutaneous Q4H   insulin aspart  6 Units Subcutaneous  Q4H   insulin detemir  30 Units Subcutaneous BID   mouth rinse  15 mL Mouth Rinse q12n4p   metoprolol tartrate  100 mg Per Tube BID   multivitamin  1 tablet Per Tube QHS   nutrition supplement (JUVEN)  1 packet Per Tube BID BM   sodium chloride  500 mL Intravenous Q24H   sodium chloride  500 mL Intravenous Q24H     Assessment/ Plan:  Heather Lopez is a 75 y.o. white female with diabetes mellitus type II, hypertension, hyperlipidemia, nephrolithiasis, depression, gout who is admitted to [facility] on 06/27/2021 for Hyperkalemia [E87.5] Hyponatremia [E87.1] ARF (acute renal failure) (HCC) [N17.9] Acute renal failure (ARF) (HCC) [N17.9] Acute respiratory failure with hypoxia ([place]) [J96.01] Demand ischemia of myocardium (HCC) [I24.8] Acute respiratory failure with hypoxemia (HCC) [J96.01] Acute renal failure superimposed on chronic kidney disease, unspecified CKD stage, unspecified acute renal failure type ([place]) [N17.9, N18.9] Sepsis, due to unspecified organism, unspecified whether acute organ dysfunction present [facility]) [A41.9]  Acute Kidney Injury on chronic kidney disease stage IV with baseline creatinine 1.97 and GFR of 25 on 12/22/19.Continues to be dialysis dependent despite nonoliguric urine output. Acute kidney injury secondary to sepsis and obstructive uropathy. Chronic kidney disease is secondary to diabetic nephropathy.Nonoliguric urine output.  - Continue to hold losartan and metformin - Plan on intermittent hemodialysis three times a week. Next treatment scheduled for tomorrow. Plan to hold dialysis over the weekend.   Lab Results  Component Value Date   CREATININE 2.35 (H) 07/21/2021   CREATININE 2.04 (H) 07/20/2021   CREATININE 2.08 (H) 07/19/2021    Intake/Output Summary (Last  24 hours) at 07/21/2021 0836 Last data filed at 07/21/2021 0600 Gross per 24 hour  Intake 1382.01 ml  Output 2525 ml  Net -1142.99 ml    2. Acute Respiratory failure secondary to community acquired  pneumonia failing outpatient antibiotics. Febrile.  - Continue supplement oxygen.  - IV pip/tazo and fluconazole - 8/20 blood cultures with no growth    3. Anemia of chronic kidney disease Normocytic Lab Results  Component Value Date   HGB 7.4 (L) 07/21/2021   - EPO on MWF HD treatments.  4.  Diabetes mellitus type II with chronic kidney disease insulin dependent. Most recent hemoglobin A1c is 8.3% on 06/12/21.  Metformin held    5.Bilateral Nephrolithiasis- with obstructive uropathy and left renal abscess  5. Hypertension with tachycardia:  - Continue clonidine, amlodipine, and metoprolol.    6. Urinary tract infection: candida glabrata.   - fluconazole. Appreciate critical care and ID input.   7. Hypokalemia: 3K bath on dialysis.  - Continue potassium supplementation   Overall prognosis is poor. Agree with palliative care consultation.      LOS: 24 Heather Lopez 8/26/20228:36 AM

## 2021-07-21 NOTE — Progress Notes (Signed)
PT Cancellation Note  Patient Details Name: Heather Lopez MRN: [id number] DOB: 20-Jun-1946   Cancelled Treatment:    Reason Eval/Treat Not Completed: Patient at procedure or test/unavailable. Pt chart reviewed. Pt underwent hemodialysis this morning. This afternoon, she had an abscess drain placed. Currently her MEWS score is a 4. PT will attempt treatment at a later date.    Patrina Levering PT, DPT 07/21/21 3:55 PM [phone]

## 2021-07-21 NOTE — Progress Notes (Addendum)
Pharmacy Antibiotic Note  Heather Lopez is a 75 y.o. female admitted on 06/27/2021 with duodenal perforation. S/P surgery to close perforation on 06/30/2021. Also treated for bilateral nephrolithiasis and candida glabrata pyelonephritis and renal abscess (S-DD to fluconazole), remains on fluconazole. ID has been following. Patient developed fever 8/20. Provider wishes to restart Zosyn for suspected intraabdominal infection.   Today, 07/21/2021 Day #7 piperacillin/tazobactam restart Day #21 fluconazole, initially for intra-abdominal coverage for perforated doudenal ulcer.  Dose increased 8/22 to 400mg  daily then to 800mg  8/22 AKI on CKD on HD, non oliguric LFTs improved from previous.  AST =33, ALT = 53   Plan: Continue piperacillin/tazobactam 3.375 IV q6h over 24min infusion due to incompatibility with amphotericin.  Although on HD will continue this dose based on good UOP and suspected residual renal function ID to evaluate duration ID changing fluconazole to Amphotericin B deoxycholate/conventional 0.3mg /kg (40mg ) IV q24h (low end of dosing range) CAN NOT use amphotericin liposomal (ambisome) to treat urinary tract infection Will get NS boluses pre and post dose Watch electrolytes closely  Height: 5' 2.01" (157.5 cm) Weight: 125.9 kg (277 lb 9 oz) IBW/kg (Calculated) : 50.12  Temp (24hrs), Avg:100 F (37.8 C), Min:98.8 F (37.1 C), Max:102.3 F (39.1 C)  Recent Labs  Lab 07/16/21 2217 07/17/21 0026 07/17/21 0434 07/18/21 0511 07/19/21 0449 07/20/21 0601 07/21/21 0528  WBC 6.3  --  7.1 7.9 7.8 10.7* 15.4*  CREATININE 2.08*  --  2.02* 1.82* 2.08* 2.04* 2.35*  LATICACIDVEN 1.9 1.7  --   --   --   --   --      Estimated Creatinine Clearance: 26.7 mL/min (A) (by C-G formula based on SCr of 2.35 mg/dL (H)).    Allergies  Allergen Reactions   Contrast Media  [Iodinated Diagnostic Agents] Anaphylaxis   Iodine Swelling    (IV only) - angioedema    Antimicrobials this  admission: 8/2 Azithromycin >> 8/4 8/2 CTX>>8/4 8/4 Zosyn>> 8/13 8/5 Fluconazole>>  8/20 Zosyn >> 8/20 vancomycin x 1  Microbiology results: 8/20 BCx: NGTD 8/10 UCx: Candida glabrata  8/5 MRSA PCR: negative   Thank you for allowing pharmacy to be a part of this patient's care.  Doreene Eland, PharmD, BCPS.   Work Cell: [phone] 07/21/2021 4:11 PM

## 2021-07-21 NOTE — Procedures (Signed)
Interventional Radiology Procedure Note  Date of Procedure: 07/21/2021  Procedure: CT drain placement   Findings:  1. Interval decrease in size of perirenal abscess  2. Successful Ct guided placement of a 12 Fr drain into the perirenal abscess. ~22ml of pus was aspirated and sent to lab.   Complications: No immediate complications noted.   Estimated Blood Loss: minimal  Follow-up and Recommendations: 1. Follow up cultures  2. Drain flush x2-3 daily  3. Follow up CT scan when appropriate/clinically improved     Albin Felling, MD  Vascular & Interventional Radiology  07/21/2021 3:28 PM

## 2021-07-21 NOTE — Progress Notes (Signed)
Patient status unchanged post 12 Fr drain placement per Dr Denna Haggard, tolerated well. 10 ml purulent drainage removed and sent to lab.transported back to CCU with Randy/RN

## 2021-07-21 NOTE — TOC Progression Note (Signed)
Transition of Care [facility]) - Progression Note    Patient Details  Name: Heather Lopez MRN: [id number] Date of Birth: [birth year]  Transition of Care [facility]) CM/SW [place], [place] Phone Number: [phone] 07/21/2021, 8:58 AM  Clinical Narrative:     CSW received email form patient's spouse Satomi, [place] (Spouse)  [phone] [facility] Phone), stating he has not made a decision yet as to which LTACH to choose because the patient was still in the ICU and he did not think she was ready for discharge.  CSW replied explained the patient was stp-down status and she would receive the same quality of medical care at the [facility], but that I would update the attending about his concerns.  Expected Discharge Plan: [place] Barriers to Discharge: Continued Medical Work up  Expected Discharge Plan and Services Expected Discharge Plan: [place] In-house Referral: Clinical Social Work   Post Acute Care Choice: [place] Living arrangements for the past 2 months: Single Family Home                                       Social Determinants of Health (SDOH) Interventions    Readmission Risk Interventions No flowsheet data found.

## 2021-07-21 NOTE — Progress Notes (Signed)
Tolerated 3 hrs of HD treatment.  UF goal of 0.5L met. Report given to porim ary RN

## 2021-07-21 NOTE — Progress Notes (Signed)
PROGRESS NOTE    Heather Lopez  YQM:[id number] DOB: Jun 16, 1946 DOA: 06/27/2021 PCP: [name], PA-C   Chief complaint.  Altered mental status. Brief Narrative:  Heather Lopez is a 75 y.o. female with history of hypertension, diabetes mellitus, hyperlipidemia has been feeling weak for the last 3 to 4 days.  Patient had a acute renal failure with creatinine of 9.3, potassium 7.5, acute metabolic acidosis with pH 7.15, procalcitonin 6.83.  Patient was diagnosed was septic shock, acute respiratory failure. On night of 8/4, patient developed acute abdominal pain, CT scan showed acute abdomen secondary to perforated duodenum.  Patient was placed on mechanical ventilation after duodenum repair. Patient had a bilateral hydronephrosis, ureteral stents was placed on 8/10. Patient urine culture was positive for Candida glabrata.  ID consult was obtained, patient currently on high-dose of Diflucan.   Assessment & Plan:   Active Problems:   Uncontrolled type 2 diabetes mellitus with insulin therapy ([place])   Hyperlipidemia associated with type 2 diabetes mellitus ([place])   Type 2 diabetes mellitus with stage 4 chronic kidney disease, with long-term current use of insulin (HCC)   Acute respiratory failure with hypoxia (HCC)   Severe sepsis with septic shock (HCC)   Acute lower UTI   Acute kidney injury superimposed on chronic kidney disease (HCC)   Pneumoperitoneum   Pressure injury of skin   Atrial flutter with rapid ventricular response (HCC)   Endotracheally intubated   Bilateral nephrolithiasis   Anemia of chronic disease   Demand ischemia of myocardium (HCC)   Lethargy   Duodenal ulcer perforation ([place])   Renal abscess   Essential hypertension   Hypokalemia    Acute metabolic encephalopathy. Sepsis with septic shock secondary to obstructive uropathy, kidney stone.  Status post bilateral ureteral stents. Candida urinary tract infection Perforated duodenum. S/p repair. Patient mental  status still not improving, very sleepy. Repeat CT scan showed a worsening right side hydronephrosis.  Also showed a pararenal fluid collection, Due to persistent fever, there is a possibility patient may have perirenal abscess.  Discussed with ID, also discussed with IR, will place drain.  Also placed orders for the drain with cultures and cell count. Also discussed with urology, will be seen again. Foley catheter was anchored yesterday. Continue antibiotics with Zosyn and Diflucan.  Acute hypoxemic respiratory failure secondary to septic shock. Morbid obesity and obesity hypoventilation syndrome  Patient is still on 40% of high flow oxygen.  Repeat chest x-ray did not show any pneumonia.  Volume status is regulated by hemodialysis.  Acute kidney injury on chronic kidney disease stage IV. Hypokalemia. Iron deficiency anemia Patient is a followed by nephrology, potassium will be repleted during dialysis today.  I will give IV iron for severe iron deficiency, hemoglobin today 7.4. Patient also has borderline B12, will also check homocystine level.  Atrial flutter with rapid ventricle response Improved.  Uncontrolled type 2 diabetes with hyperglycemia Continue current regimen.  Goals of care discussion. Had a long discussion with patient husband, patient has been in the hospital for 24 days, she is not making much progress.  Condition still critical.  She appeared to develop multiorgan failure.  Prognosis guarded.  Will obtain palliative care consult. For now, we will continue antibiotic treatment, if condition get worse, may consider hospice/palliative care.    DVT prophylaxis: Heparin Code Status: DNR  Family Communication: as above Disposition Plan:    Status is: Inpatient  Remains inpatient appropriate because:Persistent severe electrolyte disturbances, Altered mental status, Ongoing diagnostic  testing needed not appropriate for outpatient work up, IV treatments appropriate due  to intensity of illness or inability to take PO, and Inpatient level of care appropriate due to severity of illness  Dispo: The patient is from: Home              Anticipated d/c is to: SNF              Patient currently is not medically stable to d/c.   Difficult to place patient No        I/O last 3 completed shifts: In: 2558.6 [Other:1000; NG/GT:600; IV Piggyback:958.6] Out: 0160 [Urine:1725; Stool:1600] Total I/O In: -  Out: 500 [Other:500]     Consultants:  Nephrology, ID,   Procedures:   Antimicrobials: Diflucan and Zosyn  Subjective: Patient is very sleepy, confused. She is still on 40% high flow oxygen, short of breath and tachypnea with minimal exertion. She is tolerating tube feeding, no nausea vomiting abdominal pain. She has intermittent fever. She has a Foley catheter, she has no dysuria.   Objective: Vitals:   07/21/21 1145 07/21/21 1200 07/21/21 1300 07/21/21 1319  BP: (!) 150/61 (!) 152/54 (!) 144/54   Pulse: 99 98 96 92  Resp: (!) 30 (!) 31 (!) 29   Temp:  99.1 F (37.3 C)    TempSrc:      SpO2: 95% 95% 91%   Weight:      Height:        Intake/Output Summary (Last 24 hours) at 07/21/2021 1319 Last data filed at 07/21/2021 1200 Gross per 24 hour  Intake 1382.01 ml  Output 2475 ml  Net -1092.99 ml   Filed Weights   07/14/21 0500 07/17/21 0500 07/18/21 0500  Weight: 114 kg 125.5 kg 125.9 kg    Examination:  General exam: Ill-appearing, morbid obese. Respiratory system: Clear to auscultation. Respiratory effort normal. Cardiovascular system: S1 & S2 heard, RRR. No JVD, murmurs, rubs, gallops or clicks. No pedal edema. Gastrointestinal system: Abdomen is nondistended, soft and nontender. No organomegaly or masses felt. Normal bowel sounds heard. Central nervous system: Drowsy and confused no focal neurological deficits. Extremities: Symmetric 5 x 5 power. Skin: No rashes, lesions or ulcers     Data Reviewed: I have personally  reviewed following labs and imaging studies  CBC: Recent Labs  Lab 07/16/21 2217 07/17/21 0434 07/18/21 0511 07/19/21 0449 07/20/21 0601 07/21/21 0528  WBC 6.3 7.1 7.9 7.8 10.7* 15.4*  NEUTROABS 4.5  --   --   --   --  13.2*  HGB 7.9* 8.1* 7.9* 7.8* 8.6* 7.4*  HCT 25.4* 26.7* 26.2* 25.3* 28.5* 23.5*  MCV 89.1 87.5 87.3 86.1 91.6 85.8  PLT 328 346 379 358 202 109   Basic Metabolic Panel: Recent Labs  Lab 07/16/21 0444 07/16/21 2217 07/17/21 0434 07/18/21 0511 07/19/21 0449 07/20/21 0601 07/21/21 0528  NA 149*   < > 149* 144 141 136 137  K 3.8   < > 3.6 3.4* 3.2* 4.1 2.9*  CL 106   < > 111 98 100 99 99  CO2 28   < > 31 32 30 25 28   GLUCOSE 149*   < > 109* 232* 251* 190* 223*  BUN 79*   < > 83* 71* 87* 55* 60*  CREATININE 1.95*   < > 2.02* 1.82* 2.08* 2.04* 2.35*  CALCIUM 8.9   < > 8.8* 9.0 9.1 8.9 8.9  MG 2.0  --  2.0 1.9 2.2 1.9 1.9  PHOS 3.2  --  2.8 2.7 3.6 2.7  --    < > = values in this interval not displayed.   GFR: Estimated Creatinine Clearance: 26.7 mL/min (A) (by C-G formula based on SCr of 2.35 mg/dL (H)). Liver Function Tests: Recent Labs  Lab 07/15/21 1036 07/16/21 2217 07/17/21 0434 07/21/21 0528  AST 59* 50* 52* 33  ALT 139* 92* 88* 53*  ALKPHOS 124 108 107 101  BILITOT 0.7 0.4 0.5 0.5  PROT 5.8* 5.8* 6.0* 5.8*  ALBUMIN 1.7* 1.7* 1.8* 1.6*   No results for input(s): LIPASE, AMYLASE in the last 168 hours. No results for input(s): AMMONIA in the last 168 hours. Coagulation Profile: Recent Labs  Lab 07/16/21 2217  INR 1.2   Cardiac Enzymes: No results for input(s): CKTOTAL, CKMB, CKMBINDEX, TROPONINI in the last 168 hours. BNP (last 3 results) No results for input(s): PROBNP in the last 8760 hours. HbA1C: No results for input(s): HGBA1C in the last 72 hours. CBG: Recent Labs  Lab 07/20/21 1927 07/20/21 2321 07/21/21 0356 07/21/21 0734 07/21/21 1220  GLUCAP 207* 184* 218* 200* 134*   Lipid Profile: No results for input(s):  CHOL, HDL, LDLCALC, TRIG, CHOLHDL, LDLDIRECT in the last 72 hours. Thyroid Function Tests: No results for input(s): TSH, T4TOTAL, FREET4, T3FREE, THYROIDAB in the last 72 hours. Anemia Panel: Recent Labs    07/20/21 1039  VITAMINB12 330  TIBC 185*  IRON 14*   Sepsis Labs: Recent Labs  Lab 07/16/21 2217 07/17/21 0026 07/18/21 0511  PROCALCITON 1.54  --  1.88  LATICACIDVEN 1.9 1.7  --     Recent Results (from the past 240 hour(s))  CULTURE, BLOOD (ROUTINE X 2) w Reflex to ID Panel     Status: None   Collection Time: 07/15/21  7:57 PM   Specimen: BLOOD  Result Value Ref Range Status   Specimen Description BLOOD LEFT ASSIST CONTROL  Final   Special Requests   Final    BOTTLES DRAWN AEROBIC AND ANAEROBIC Blood Culture adequate volume   Culture   Final    NO GROWTH 5 DAYS Performed at [facility], [address] [place], [place] [address]    Report Status 07/20/2021 FINAL  Final  CULTURE, BLOOD (ROUTINE X 2) w Reflex to ID Panel     Status: None   Collection Time: 07/15/21  8:07 PM   Specimen: BLOOD  Result Value Ref Range Status   Specimen Description BLOOD LEFT FOREARM  Final   Special Requests   Final    BOTTLES DRAWN AEROBIC AND ANAEROBIC Blood Culture adequate volume   Culture   Final    NO GROWTH 5 DAYS Performed at [facility], [address], [place], [place] [address]    Report Status 07/20/2021 FINAL  Final         Radiology Studies: CT ABDOMEN PELVIS WO CONTRAST  Result Date: 07/20/2021 CLINICAL DATA:  Abdominal abscess/infection suspected Weakness and acute renal failure. EXAM: CT ABDOMEN AND PELVIS WITHOUT CONTRAST TECHNIQUE: Multidetector CT imaging of the abdomen and pelvis was performed following the standard protocol without IV contrast. COMPARISON:  Noncontrast CT 5 days ago 07/15/2021, additional prior exams reviewed. FINDINGS: Lower chest: Dialysis catheter tip in the right atrium slight improvement in bilateral pleural  effusions and bibasilar atelectasis from prior exam. Moderate atelectasis persists. Hepatobiliary: No evidence of focal hepatic lesion on this unenhanced exam. Possible layering small stones or sludge in the gallbladder without pericholecystic inflammation. No common bile duct dilatation. Pancreas: No ductal dilatation or  inflammation. Spleen: Normal in size without focal abnormality. Adrenals/Urinary Tract: Mild adrenal thickening without dominant nodule. Right-sided ureteral stent in place. The degree of right hydronephrosis has increased from most recent CT. There is mild right perinephric edema. 12 mm stone in the lower right kidney. No stone or stone fragments along the course of the ureteral stent. The distal pigtail is coiled in the dependent bladder. Left ureteral stent in place. Mild left hydronephrosis which has progressed from prior exam. Multiple irregular stones in the upper left kidney, including curvilinear stone measuring 2.3 cm. Subcapsular collection in the upper right kidney measures approximately 2.7 x 5.9 cm, previously less well-defined. Left anterior perirenal fluid collection measures approximately 12.3 x 2.2 cm, series 2, image 46. Cranial caudal length of 9.8 cm, with irregular shape and fluid collection coursing in the pericolic gutter and anterior retroperitoneum. Mild adjacent fat stranding. This is not significantly changed from prior exam. Distended urinary bladder. No bladder wall thickening. Stomach/Bowel: Enteric tube tip is in the stomach. The stomach is decompressed. No small bowel obstruction or inflammation. Normal appendix. Fluid/liquid colon in the cecum and ascending colon. More distal colon is nondistended. Occasional sigmoid diverticulosis without diverticulitis. Rectal tube is in place. Vascular/Lymphatic: Moderate aortic atherosclerosis. No aortic aneurysm scattered retroperitoneal lymph nodes are prominent, but not enlarged by size criteria. Reproductive: Anteverted  uterus which is heterogeneous with small calcifications suggesting fibroids. Stable appearance from prior. No suspicious adnexal mass. Other: Left anterior perirenal collection which appears to be contiguous with the subcapsular collection about the left kidney as described above. This collection extends in the anterior pararenal space and retroperitoneum with adjacent fat stranding. Distal extent into the pericolic gutter. There is no internal air. Sterility is indeterminate by imaging. Previous right lower quadrant drainage catheter has been removed. No evidence of recurrence right-sided collection. No free air. Soft tissue densities in the anterior abdominal wall typical of medication injection sites. Musculoskeletal: Stable from prior. Degenerative change in the spine. IMPRESSION: 1. Left anterior perirenal fluid collection which appears to be contiguous with the subcapsular collection about the upper left kidney. This collection extends in the anterior pararenal space and retroperitoneum with adjacent fat stranding. This is not significantly changed in size from prior exam. Sterility is indeterminate by imaging. 2. Right-sided ureteral stent in place. The degree of right hydronephrosis has increased from most recent CT. Left ureteral stent in place. Mild left hydronephrosis which has progressed from prior exam. 3. Bilateral nonobstructing nephrolithiasis. 4. Fluid/liquid colon in the cecum and ascending colon, can be seen with diarrheal illness. 5. Slight improvement in bilateral pleural effusions and bibasilar atelectasis from prior exam. Aortic Atherosclerosis (ICD10-I70.0). Electronically Signed   By: Keith Rake M.D.   On: 07/20/2021 15:26   DG Chest Port 1 View  Result Date: 07/20/2021 CLINICAL DATA:  Aspiration pneumonia. EXAM: PORTABLE CHEST 1 VIEW COMPARISON:  07/18/2021. FINDINGS: Left IJ dialysis catheter tip is in the right atrium. Nasogastric tube is followed into the stomach with the tip  projecting beyond the inferior margin of the image. Trachea is midline. Heart is enlarged, stable. Lungs are somewhat low in volume with mild central pulmonary vascular congestion and streaky bibasilar opacities. No definite pleural fluid. IMPRESSION: Streaky bibasilar opacities may be due to atelectasis. Electronically Signed   By: Lorin Picket M.D.   On: 07/20/2021 13:14        Scheduled Meds:  amLODipine  10 mg Per Tube Daily   chlorhexidine  15 mL Mouth Rinse BID  Chlorhexidine Gluconate Cloth  6 each Topical Q0600   cloNIDine  0.1 mg Per Tube Daily   epoetin (EPOGEN/PROCRIT) injection  10,000 Units Intravenous Q M,W,F-HD   famotidine  20 mg Per Tube QHS   feeding supplement (PROSource TF)  45 mL Per Tube BID   free water  200 mL Per Tube Q4H   heparin injection (subcutaneous)  5,000 Units Subcutaneous Q8H   insulin aspart  0-20 Units Subcutaneous Q4H   insulin aspart  6 Units Subcutaneous Q4H   insulin detemir  30 Units Subcutaneous BID   mouth rinse  15 mL Mouth Rinse q12n4p   metoprolol tartrate  100 mg Per Tube BID   multivitamin  1 tablet Per Tube QHS   nutrition supplement (JUVEN)  1 packet Per Tube BID BM   Continuous Infusions:  feeding supplement (NEPRO CARB STEADY) 50 mL/hr at 07/19/21 1500   fluconazole (DIFLUCAN) IV     piperacillin-tazobactam (ZOSYN)  IV 12.5 mL/hr at 07/21/21 0600     LOS: 24 days    Time spent: 35 minutes    Sharen Hones, MD [facility]   To contact the attending provider between 7A-7P or the covering provider during after hours 7P-7A, please log into the web site [email or web address] and access using universal [place] password for that web site. If you do not have the password, please call the hospital operator.  07/21/2021, 1:19 PM

## 2021-07-22 LAB — GLUCOSE, CAPILLARY
Glucose-Capillary: 129 mg/dL — ABNORMAL HIGH (ref 70–99)
Glucose-Capillary: 133 mg/dL — ABNORMAL HIGH (ref 70–99)
Glucose-Capillary: 182 mg/dL — ABNORMAL HIGH (ref 70–99)
Glucose-Capillary: 186 mg/dL — ABNORMAL HIGH (ref 70–99)
Glucose-Capillary: 274 mg/dL — ABNORMAL HIGH (ref 70–99)

## 2021-07-22 LAB — BASIC METABOLIC PANEL
Anion gap: 10 (ref 5–15)
BUN: 41 mg/dL — ABNORMAL HIGH (ref 8–23)
CO2: 28 mmol/L (ref 22–32)
Calcium: 8.5 mg/dL — ABNORMAL LOW (ref 8.9–10.3)
Chloride: 97 mmol/L — ABNORMAL LOW (ref 98–111)
Creatinine, Ser: 1.83 mg/dL — ABNORMAL HIGH (ref 0.44–1.00)
GFR, Estimated: 29 mL/min — ABNORMAL LOW (ref >60)
Glucose, Bld: 154 mg/dL — ABNORMAL HIGH (ref 70–99)
Potassium: 2.8 mmol/L — ABNORMAL LOW (ref 3.5–5.1)
Sodium: 135 mmol/L (ref 135–145)

## 2021-07-22 LAB — HOMOCYSTEINE: Homocysteine: 11.3 umol/L (ref 0.0–19.2)

## 2021-07-22 LAB — POTASSIUM: Potassium: 3 mmol/L — ABNORMAL LOW (ref 3.5–5.1)

## 2021-07-22 LAB — MAGNESIUM
Magnesium: 1.8 mg/dL (ref 1.7–2.4)
Magnesium: 2 mg/dL (ref 1.7–2.4)

## 2021-07-22 MED ORDER — SODIUM CHLORIDE 0.9 % IV SOLN
INTRAVENOUS | Status: DC | PRN
  Administered 2021-07-22 – 2021-07-26 (×2): 250 mL via INTRAVENOUS

## 2021-07-22 MED ORDER — CYANOCOBALAMIN 1000 MCG/ML IJ SOLN
1000.0000 ug | Freq: Once | INTRAMUSCULAR | Status: AC
  Administered 2021-07-22: 1000 ug via INTRAMUSCULAR
  Filled 2021-07-22 (×2): qty 1

## 2021-07-22 MED ORDER — POTASSIUM CHLORIDE 10 MEQ/100ML IV SOLN
10.0000 meq | INTRAVENOUS | Status: AC
  Administered 2021-07-22 (×4): 10 meq via INTRAVENOUS
  Filled 2021-07-22 (×4): qty 100

## 2021-07-22 NOTE — Progress Notes (Signed)
[facility]  PROGRESS NOTE   Subjective:   Patient is lethargic but arousable. Seen in the ICU. Urine output has been improving.  Objective:  Vital signs in last 24 hours:  Temp:  [97.7 F (36.5 C)-99.6 F (37.6 C)] 99.6 F (37.6 C) (08/27 1200) Pulse Rate:  [79-92] 79 (08/27 1200) Resp:  [22-37] 23 (08/27 1200) BP: (115-153)/(44-62) 128/50 (08/27 1200) SpO2:  [89 %-98 %] 93 % (08/27 1200) FiO2 (%):  [40 %] 40 % (08/26 1319) Weight:  [125.9 kg] 125.9 kg (08/27 0500)  Weight change:  Filed Weights   07/17/21 0500 07/18/21 0500 07/22/21 0500  Weight: 125.5 kg 125.9 kg 125.9 kg    Intake/Output: I/O last 3 completed shifts: In: 2509.1 [Other:900; IV Piggyback:1609.1] Out: 8366 [QHUTM:5465; Drains:5; Other:500; KPTWS:5681]   Intake/Output this shift:  No intake/output data recorded.  Physical Exam: General:  No acute distress  Head:  Normocephalic, atraumatic. Moist oral mucosal membranes  Eyes:  Anicteric  Neck:  Supple  Lungs:   Clear to auscultation, normal effort  Heart:  S1S2 no rubs  Abdomen:   Soft, nontender, bowel sounds present  Extremities:  peripheral edema.  Neurologic:  Awake, alert, following commands  Skin:  No lesions  Access:     Basic Metabolic Panel: Recent Labs  Lab 07/16/21 0444 07/16/21 2217 07/17/21 0434 07/18/21 0511 07/19/21 0449 07/20/21 0601 07/21/21 0528 07/22/21 0516  NA 149*   < > 149* 144 141 136 137 135  K 3.8   < > 3.6 3.4* 3.2* 4.1 2.9* 2.8*  CL 106   < > 111 98 100 99 99 97*  CO2 28   < > 31 32 30 25 28 28   GLUCOSE 149*   < > 109* 232* 251* 190* 223* 154*  BUN 79*   < > 83* 71* 87* 55* 60* 41*  CREATININE 1.95*   < > 2.02* 1.82* 2.08* 2.04* 2.35* 1.83*  CALCIUM 8.9   < > 8.8* 9.0 9.1 8.9 8.9 8.5*  MG 2.0  --  2.0 1.9 2.2 1.9 1.9 1.8  PHOS 3.2  --  2.8 2.7 3.6 2.7  --   --    < > = values in this interval not displayed.    CBC: Recent Labs  Lab 07/16/21 2217 07/17/21 0434 07/18/21 0511  07/19/21 0449 07/20/21 0601 07/21/21 0528  WBC 6.3 7.1 7.9 7.8 10.7* 15.4*  NEUTROABS 4.5  --   --   --   --  13.2*  HGB 7.9* 8.1* 7.9* 7.8* 8.6* 7.4*  HCT 25.4* 26.7* 26.2* 25.3* 28.5* 23.5*  MCV 89.1 87.5 87.3 86.1 91.6 85.8  PLT 328 346 379 358 202 303     Urinalysis: No results for input(s): COLORURINE, LABSPEC, PHURINE, GLUCOSEU, HGBUR, BILIRUBINUR, KETONESUR, PROTEINUR, UROBILINOGEN, NITRITE, LEUKOCYTESUR in the last 72 hours.  Invalid input(s): APPERANCEUR    Imaging: CT IMAGE GUIDED DRAINAGE BY PERCUTANEOUS CATHETER  Result Date: 07/21/2021 INDICATION: Perirenal abscess, continued fevers EXAM: CT GUIDED DRAINAGE OF left perirenal ABSCESS MEDICATIONS: The patient is currently admitted to the hospital and receiving intravenous antibiotics. The antibiotics were administered within an appropriate time frame prior to the initiation of the procedure. ANESTHESIA/SEDATION: Local analgesia COMPLICATIONS: None immediate. TECHNIQUE: Informed written consent was obtained from the patient after a thorough discussion of the procedural risks, benefits and alternatives. All questions were addressed. Maximal Sterile Barrier Technique was utilized including caps, mask, sterile gowns, sterile gloves, sterile drape, hand hygiene and skin antiseptic. A timeout was  performed prior to the initiation of the procedure. PROCEDURE: The patient was placed supine on the exam table. Limited CT of the abdomen was performed for planning purposes. This again demonstrated left perinephric fullness suggestive of an abscess. It does appear relatively decreased in size however, given the patient's severity of illness, the decision was made to proceed with aspiration and possible drain placement. Skin entry site was marked, and the overlying skin was prepped and draped in a standard sterile fashion. Local analgesia was obtained with 1% lidocaine. Using CT fluoroscopy, a 19 gauge Yueh catheter was advanced into the left  perirenal abscess. There was return of purulent material. An Amplatz wire was then advanced through the Tyronza catheter, followed by serial tract dilation and placement of a 12 French multipurpose locking drainage catheter into the perirenal abscess. Location was confirmed with CT. A total of approximately 5 mL of frankly purulent material was aspirated, and sent to the lab for analysis. The drainage catheter was secured to the skin using silk suture. It was attached to bulb suction. A clean dressing was placed. The patient tolerated the procedure well without immediate complication. FINDINGS: 62 French multipurpose locking drainage catheter placed into the left perirenal abscess. 5 mL of purulent material was aspirated and sent to the lab. IMPRESSION: Successful CT-guided placement of a 12 French locking drainage catheter into the left perirenal abscess. A total of approximately 5 mL of purulent material was aspirated, and sent to the lab for analysis. Electronically Signed   By: Albin Felling M.D.   On: 07/21/2021 16:16     Medications:    amphotericin B  Conventional (FUNGIZONE) IV Stopped (07/22/21 0201)   feeding supplement (NEPRO CARB STEADY) 50 mL/hr at 07/19/21 1500   piperacillin-tazobactam 3.375 g (07/22/21 0834)    acetaminophen (TYLENOL) oral liquid 160 mg/5 mL  650 mg Per Tube Q24H   amLODipine  10 mg Per Tube Daily   chlorhexidine  15 mL Mouth Rinse BID   Chlorhexidine Gluconate Cloth  6 each Topical Q0600   cloNIDine  0.1 mg Per Tube Daily   cyanocobalamin  1,000 mcg Intramuscular Once   dextrose  10 mL Intravenous Q24H   dextrose  10 mL Intravenous Q24H   epoetin (EPOGEN/PROCRIT) injection  10,000 Units Intravenous Q M,W,F-HD   famotidine  20 mg Per Tube QHS   feeding supplement (PROSource TF)  45 mL Per Tube BID   free water  200 mL Per Tube Q4H   heparin injection (subcutaneous)  5,000 Units Subcutaneous Q8H   insulin aspart  0-20 Units Subcutaneous Q4H   insulin aspart  6  Units Subcutaneous Q4H   insulin detemir  30 Units Subcutaneous BID   mouth rinse  15 mL Mouth Rinse q12n4p   metoprolol tartrate  100 mg Per Tube BID   multivitamin  1 tablet Per Tube QHS   nutrition supplement (JUVEN)  1 packet Per Tube BID BM   sodium chloride  500 mL Intravenous Q24H   sodium chloride  500 mL Intravenous Q24H   sodium chloride flush  5 mL Intracatheter Q8H    Assessment/ Plan:     Active Problems:   Uncontrolled type 2 diabetes mellitus with insulin therapy ([place])   Hyperlipidemia associated with type 2 diabetes mellitus ([place])   Type 2 diabetes mellitus with stage 4 chronic kidney disease, with long-term current use of insulin (H[facility])   Acute respiratory failure with hypoxia (HCC)   Severe sepsis with septic shock (HCC)   Acute  lower UTI   Acute kidney injury superimposed on chronic kidney disease (HCC)   Pneumoperitoneum   Pressure injury of skin   Atrial flutter with rapid ventricular response (HCC)   Endotracheally intubated   Bilateral nephrolithiasis   Anemia of chronic disease   Demand ischemia of myocardium (HCC)   Lethargy   Duodenal ulcer perforation (HCC)   Renal abscess   Essential hypertension   Hypokalemia  75 year old white female with history of hypertension, diabetes, hyperlipidemia, coronary artery disease, nephrolithiasis, gout, depression now admitted with history of acute respiratory failure and acute kidney injury along with hyperkalemia.  #1: Acute kidney injury on the top of chronic kidney disease.  This is probably secondary to obstructive uropathy complicated with sepsis.  She is presently s/p ureteral stents and acute kidney injury has been improving.  Urine output is much improving.  We will continue to hold the metformin and losartan for the time being.  #2: End-stage renal disease: Acute on chronic kidney disease.  Will hold dialysis and monitor urine output closely.  #3: Anemia: Patient has anemia secondary to chronic kidney  disease.  We will continue to monitor closely.  #4: Urosepsis: Continue antibiotics as per infectious disease specialist.  #5: Nephrolithiasis and obstructive uropathy: S/p ureteral stents.  We will continue to monitor closely during the hospitalization and give recommendations as needed.   LOS: [place], [facility] 8/27/20221:09 PM

## 2021-07-22 NOTE — Progress Notes (Signed)
Occupational Therapy Treatment Patient Details Name: Heather Lopez MRN: [id number] DOB: [birth year] Today's Date: 07/22/2021    History of present illness Pt is a [name] who presented to the ED on 06/27/21 with chief complaints of progressive shortness of breath, productive cough, loss of appetite, nausea, dry heaving fatigue, malaise and generalized body aches. Patient initially went to her PCP on 7/29 with complaints of rhinorrhea, harsh cough, loss of appetite, nausea, fatigue, malaise and generalized body aches x2 days, patient was diagnosed with pneumonia of which she received ceftriaxone 1 mg IM and sent home with Levaquin, prednisone and Tessalon. On 8/4 pt developed acute abdominal pain with CT showing viscus perforation. Pt urgently taken to the OR for ex lap. Pt is also s/p cystoscopy with B ureteral stent placement for L obstructed kidney stone on 8/10.   OT comments  Heather Lopez was seen for OT treatment on this date. Upon arrival to room pt sleeping but arouses to voice, increased participation this date and husband in room mid session. Pt completed bed level exercises as described below, 2 sets x 5 reps each with husband instructed on tehcnique. MOD A self-feeding ice chips using built up handle at bed in chair - assist for last ~20* to mouth and to navigate around NGT. MAX A for LBD at bed level. Pt making good progress toward goals. Pt continues to benefit from skilled OT services to maximize return to PLOF and minimize risk of future falls, injury, caregiver burden, and readmission. Will continue to follow POC. Discharge recommendation remains appropriate.    Follow Up Recommendations  [facility] bed    Recommendations for Other Services      Precautions / Restrictions Precautions Precautions: Fall Precaution Comments: NG tube, abdominal incision & JP drains, foley catheter, fecal tube, L jugular tunneled hemodialysis catheter Restrictions Weight  Bearing Restrictions: No       Mobility Bed Mobility               General bed mobility comments: pt tolerated ~15 min in bed in chair                            ADL either performed or assessed with clinical judgement   ADL Overall ADL's : Needs assistance/impaired                                       General ADL Comments: MOD A self-feeding ice chips using built up handle at bed in chair - assist for last ~20* to mouth and to navigate around NGT. MAX A for LBD at bed level      Cognition Arousal/Alertness: Awake/alert Behavior During Therapy: Flat affect Overall Cognitive Status: Impaired/Different from baseline                   Orientation Level: Disoriented to;Time;Situation   Memory: Decreased short-term memory;Decreased recall of precautions Following Commands: Follows one step commands inconsistently;Follows one step commands with increased time Safety/Judgement: Decreased awareness of safety;Decreased awareness of deficits              Exercises Exercises: Other exercises;General Upper Extremity General Exercises - Upper Extremity Shoulder Flexion: AROM;Strengthening;AAROM;Both;10 reps;Supine Elbow Flexion: AROM;Strengthening;AAROM;Both;10 reps;Supine Elbow Extension: AROM;Strengthening;AAROM;Both;10 reps;Supine Digit Composite Flexion: AROM;Strengthening;AAROM;Both;10 reps;Supine Composite Extension: Both;10 reps;Supine;PROM Other Exercises Other Exercises: Family and pt  educated re: OT role, d/c recs, PROM HEP, adapted dining utensils Other Exercises: Self-feeding, bed level exercises, BLE ankle dorsiflexion/plantar flexion      General Comments SpO2 90s t/o on 15L HFNC    Pertinent Vitals/ Pain       Pain Assessment: Faces Faces Pain Scale: Hurts a little bit Pain Location: extending digits Pain Descriptors / Indicators: Discomfort Pain Intervention(s): Limited activity within patient's  tolerance   Frequency  Min 1X/week        Progress Toward Goals  OT Goals(current goals can now be found in the care plan section)  Progress towards OT goals: Progressing toward goals  Acute Rehab OT Goals Patient Stated Goal: to go home OT Goal Formulation: With family Time For Goal Achievement: 08/03/21 Potential to Achieve Goals: Fair ADL Goals Pt Will Transfer to Toilet: with max assist;with +2 assist Pt/caregiver will Perform Home Exercise Program: Both right and left upper extremity;With written HEP provided Additional ADL Goal #1: Pt will respond to 2/5 1 step commands c MOD cues  Plan Discharge plan remains appropriate;Frequency remains appropriate    Co-evaluation                 AM-PAC OT "6 Clicks" Daily Activity     Outcome Measure   Help from another person eating meals?: A Lot Help from another person taking care of personal grooming?: A Lot Help from another person toileting, which includes using toliet, bedpan, or urinal?: Total Help from another person bathing (including washing, rinsing, drying)?: Total Help from another person to put on and taking off regular upper body clothing?: A Lot Help from another person to put on and taking off regular lower body clothing?: Total 6 Click Score: 9    End of Session Equipment Utilized During Treatment: Oxygen  OT Visit Diagnosis: Muscle weakness (generalized) (M62.81)   Activity Tolerance Patient tolerated treatment well   Patient Left in bed;with call bell/phone within reach;with family/visitor present   Nurse Communication          Time: 6503-5465 OT Time Calculation (min): 26 min  Charges: OT General Charges $OT Visit: 1 Visit OT Treatments $Self Care/Home Management : 8-22 mins $Therapeutic Exercise: 8-22 mins  Heather Lopez, M.S. OTR/L  07/22/21, 2:12 PM  ascom [phone]

## 2021-07-22 NOTE — Progress Notes (Signed)
PROGRESS NOTE    Heather Lopez  KDT:[id number] DOB: October 22, 1946 DOA: 06/27/2021 PCP: [name], PA-C   Chief complaint.  Altered mental status Brief Narrative:  Heather Lopez is a 75 y.o. female with history of hypertension, diabetes mellitus, hyperlipidemia has been feeling weak for the last 3 to 4 days.  Patient had a acute renal failure with creatinine of 9.3, potassium 7.5, acute metabolic acidosis with pH 7.15, procalcitonin 6.83.  Patient was diagnosed was septic shock, acute respiratory failure. On night of 8/4, patient developed acute abdominal pain, CT scan showed acute abdomen secondary to perforated duodenum.  Patient was placed on mechanical ventilation after duodenum repair. Patient had a bilateral hydronephrosis, ureteral stents was placed on 8/10. Patient urine culture was positive for Candida glabrata.  ID consult was obtained, patient currently on high-dose of Diflucan. 8/26. Due to persistent fever, CT scan of abdomen/pelvis was repeated on 8/26, aspiration pararenal fluids was obtained, removed 5 mL of pus.  ID changed antibiotic to Amphotericin in addition to Zosyn.  Assessment & Plan:   Active Problems:   Uncontrolled type 2 diabetes mellitus with insulin therapy ([place])   Hyperlipidemia associated with type 2 diabetes mellitus ([place])   Type 2 diabetes mellitus with stage 4 chronic kidney disease, with long-term current use of insulin (HCC)   Acute respiratory failure with hypoxia (HCC)   Severe sepsis with septic shock (HCC)   Acute lower UTI   Acute kidney injury superimposed on chronic kidney disease (HCC)   Pneumoperitoneum   Pressure injury of skin   Atrial flutter with rapid ventricular response (HCC)   Endotracheally intubated   Bilateral nephrolithiasis   Anemia of chronic disease   Demand ischemia of myocardium (HCC)   Lethargy   Duodenal ulcer perforation ([place])   Renal abscess   Essential hypertension   Hypokalemia  Acute metabolic  encephalopathy. Sepsis with septic shock secondary to obstructive uropathy, kidney stone.  Status post bilateral ureteral stents. Candida urinary tract infection Perforated duodenum. S/p repair. Perirenal abscess. Discussed with Dr. Diamantina Providence, hydronephrosis was thought to be due to urinary retention.  Foley cath was anchored. Patient persistent fever could be due to pararenal abscess.  IR has removed only 5 mL of pus, culture was sent out.  Discussed with Dr. Ramon Dredge yesterday, antibiotics changed to Amphotericin in addition to Zosyn.  I will follow the culture results. Patient will also be followed closely, if condition still not improving, I will have to perform a repeat ultrasound to make sure patient no longer has any hydronephrosis Patient condition seem to be improving since yesterday, she is more awake, but still confused.  Acute hypoxemic respiratory failure secondary to septic shock. Morbid obesity and obesity hypoventilation syndrome  Patient oxygenation seems to be improving since yesterday, currently on 10 L oxygen.  Acute kidney injury on chronic kidney disease stage IV. Hypokalemia. Iron deficiency anemia Discussed with nephrology, patient renal function does not seem to be improving. Patient still has significant hypokalemia today, she would not be dialyzed today.  I discussed with nephrology, he has ordered 40 mill equivalent IV potassium. Patient also received IV iron yesterday, B12 level borderline, pending homocystine.  I will give a dose of B12 injection.  Recheck a CBC tomorrow.  Transfuse as needed.  Uncontrolled type 2 diabetes with hyperglycemia Continue to follow, adjust insulin dose as needed  DVT prophylaxis: Heparin  Code Status: DNR Family Communication:  Disposition Plan:    Status is: Inpatient  Remains inpatient appropriate because:Altered mental  status, IV treatments appropriate due to intensity of illness or inability to take PO, and Inpatient  level of care appropriate due to severity of illness  Dispo: The patient is from: Home              Anticipated d/c is to: SNF              Patient currently is not medically stable to d/c.   Difficult to place patient No        I/O last 3 completed shifts: In: 2509.1 [Other:900; IV Piggyback:1609.1] Out: 8676 [PPJKD:3267; Drains:5; Other:500; TIWPY:0998] No intake/output data recorded.     Consultants:  Nephrology, ID  Procedures:   Antimicrobials: Amphotericin and Zosyn.  Subjective: Patient mental status started improving since yesterday, she was able to talk with the family.  But she still confused today. Oxygenation is better, currently on 10 L oxygen, short of breath with exertion. No cough. No chest pain or palpitation. No dysuria hematuria, Foley in place.   Objective: Vitals:   07/22/21 0600 07/22/21 0714 07/22/21 0800 07/22/21 0900  BP: (!) 138/54  (!) 143/46 (!) 147/44  Pulse: 84  89 87  Resp: (!) 29  (!) 34 (!) 30  Temp:   99.6 F (37.6 C)   TempSrc:      SpO2: 95% 94% (!) 89% (!) 89%  Weight:      Height:        Intake/Output Summary (Last 24 hours) at 07/22/2021 1039 Last data filed at 07/22/2021 0500 Gross per 24 hour  Intake 1127.12 ml  Output 2805 ml  Net -1677.88 ml   Filed Weights   07/17/21 0500 07/18/21 0500 07/22/21 0500  Weight: 125.5 kg 125.9 kg 125.9 kg    Examination:  General exam: Appears calm and comfortable, Morbid obese Respiratory system: Clear to auscultation. Respiratory effort normal. Cardiovascular system: S1 & S2 heard, RRR. No JVD, murmurs, rubs, gallops or clicks. No pedal edema. Gastrointestinal system: Abdomen is nondistended, soft and nontender. No organomegaly or masses felt. Normal bowel sounds heard. Central nervous system: Alert and oriented x2. No focal neurological deficits. Extremities: Symmetric 5 x 5 power. Skin: No rashes, lesions or ulcers Psychiatry:  Mood & affect appropriate.     Data  Reviewed: I have personally reviewed following labs and imaging studies  CBC: Recent Labs  Lab 07/16/21 2217 07/17/21 0434 07/18/21 0511 07/19/21 0449 07/20/21 0601 07/21/21 0528  WBC 6.3 7.1 7.9 7.8 10.7* 15.4*  NEUTROABS 4.5  --   --   --   --  13.2*  HGB 7.9* 8.1* 7.9* 7.8* 8.6* 7.4*  HCT 25.4* 26.7* 26.2* 25.3* 28.5* 23.5*  MCV 89.1 87.5 87.3 86.1 91.6 85.8  PLT 328 346 379 358 202 338   Basic Metabolic Panel: Recent Labs  Lab 07/16/21 0444 07/16/21 2217 07/17/21 0434 07/18/21 0511 07/19/21 0449 07/20/21 0601 07/21/21 0528 07/22/21 0516  NA 149*   < > 149* 144 141 136 137 135  K 3.8   < > 3.6 3.4* 3.2* 4.1 2.9* 2.8*  CL 106   < > 111 98 100 99 99 97*  CO2 28   < > 31 32 30 25 28 28   GLUCOSE 149*   < > 109* 232* 251* 190* 223* 154*  BUN 79*   < > 83* 71* 87* 55* 60* 41*  CREATININE 1.95*   < > 2.02* 1.82* 2.08* 2.04* 2.35* 1.83*  CALCIUM 8.9   < > 8.8* 9.0 9.1 8.9 8.9 8.5*  MG 2.0  --  2.0 1.9 2.2 1.9 1.9 1.8  PHOS 3.2  --  2.8 2.7 3.6 2.7  --   --    < > = values in this interval not displayed.   GFR: Estimated Creatinine Clearance: 34.2 mL/min (A) (by C-G formula based on SCr of 1.83 mg/dL (H)). Liver Function Tests: Recent Labs  Lab 07/16/21 2217 07/17/21 0434 07/21/21 0528  AST 50* 52* 33  ALT 92* 88* 53*  ALKPHOS 108 107 101  BILITOT 0.4 0.5 0.5  PROT 5.8* 6.0* 5.8*  ALBUMIN 1.7* 1.8* 1.6*   No results for input(s): LIPASE, AMYLASE in the last 168 hours. No results for input(s): AMMONIA in the last 168 hours. Coagulation Profile: Recent Labs  Lab 07/16/21 2217  INR 1.2   Cardiac Enzymes: No results for input(s): CKTOTAL, CKMB, CKMBINDEX, TROPONINI in the last 168 hours. BNP (last 3 results) No results for input(s): PROBNP in the last 8760 hours. HbA1C: No results for input(s): HGBA1C in the last 72 hours. CBG: Recent Labs  Lab 07/21/21 1220 07/21/21 1604 07/21/21 1929 07/21/21 2329 07/22/21 0732  GLUCAP 134* 151* 180* 203* 129*    Lipid Profile: No results for input(s): CHOL, HDL, LDLCALC, TRIG, CHOLHDL, LDLDIRECT in the last 72 hours. Thyroid Function Tests: No results for input(s): TSH, T4TOTAL, FREET4, T3FREE, THYROIDAB in the last 72 hours. Anemia Panel: Recent Labs    07/20/21 1039  VITAMINB12 330  TIBC 185*  IRON 14*   Sepsis Labs: Recent Labs  Lab 07/16/21 2217 07/17/21 0026 07/18/21 0511  PROCALCITON 1.54  --  1.88  LATICACIDVEN 1.9 1.7  --     Recent Results (from the past 240 hour(s))  CULTURE, BLOOD (ROUTINE X 2) w Reflex to ID Panel     Status: None   Collection Time: 07/15/21  7:57 PM   Specimen: BLOOD  Result Value Ref Range Status   Specimen Description BLOOD LEFT ASSIST CONTROL  Final   Special Requests   Final    BOTTLES DRAWN AEROBIC AND ANAEROBIC Blood Culture adequate volume   Culture   Final    NO GROWTH 5 DAYS Performed at [facility], [address] [place], [place] [address]    Report Status 07/20/2021 FINAL  Final  CULTURE, BLOOD (ROUTINE X 2) w Reflex to ID Panel     Status: None   Collection Time: 07/15/21  8:07 PM   Specimen: BLOOD  Result Value Ref Range Status   Specimen Description BLOOD LEFT FOREARM  Final   Special Requests   Final    BOTTLES DRAWN AEROBIC AND ANAEROBIC Blood Culture adequate volume   Culture   Final    NO GROWTH 5 DAYS Performed at [facility], [address], [place], [place] [address]    Report Status 07/20/2021 FINAL  Final  Body fluid culture w Gram Stain     Status: None (Preliminary result)   Collection Time: 07/21/21  1:23 PM   Specimen: Peritoneal Washings; Body Fluid  Result Value Ref Range Status   Specimen Description   Final    PERITONEAL Performed at [facility], [address] [place], [place] [address]    Special Requests   Final    NONE Performed at [facility], [address] [place], [place] [address]    Gram Stain   Final    MODERATE WBC PRESENT,BOTH PMN AND  MONONUCLEAR NO ORGANISMS SEEN    Culture   Final  NO GROWTH < 12 HOURS Performed at [place] [place] [address] [place], [place] [address]    Report Status PENDING  Incomplete  Aerobic/Anaerobic Culture w Gram Stain (surgical/deep wound)     Status: None (Preliminary result)   Collection Time: 07/21/21  3:32 PM   Specimen: Abdomen  Result Value Ref Range Status   Specimen Description   Final    ABDOMEN Performed at [facility] [address], [place], [place] [address]    Special Requests   Final    NONE Performed at [facility], [address] [place], [place] [address]    Gram Stain   Final    MODERATE SQUAMOUS EPITHELIAL CELLS PRESENT WBC PRESENT, PREDOMINANTLY PMN NO ORGANISMS SEEN Performed at [facility], [place] [address], [place], [place] [address]    Culture PENDING  Incomplete   Report Status PENDING  Incomplete         Radiology Studies: CT ABDOMEN PELVIS WO CONTRAST  Result Date: 07/20/2021 CLINICAL DATA:  Abdominal abscess/infection suspected Weakness and acute renal failure. EXAM: CT ABDOMEN AND PELVIS WITHOUT CONTRAST TECHNIQUE: Multidetector CT imaging of the abdomen and pelvis was performed following the standard protocol without IV contrast. COMPARISON:  Noncontrast CT 5 days ago 07/15/2021, additional prior exams reviewed. FINDINGS: Lower chest: Dialysis catheter tip in the right atrium slight improvement in bilateral pleural effusions and bibasilar atelectasis from prior exam. Moderate atelectasis persists. Hepatobiliary: No evidence of focal hepatic lesion on this unenhanced exam. Possible layering small stones or sludge in the gallbladder without pericholecystic inflammation. No common bile duct dilatation. Pancreas: No ductal dilatation or inflammation. Spleen: Normal in size without focal abnormality. Adrenals/Urinary Tract: Mild adrenal thickening without dominant nodule. Right-sided ureteral stent in place. The  degree of right hydronephrosis has increased from most recent CT. There is mild right perinephric edema. 12 mm stone in the lower right kidney. No stone or stone fragments along the course of the ureteral stent. The distal pigtail is coiled in the dependent bladder. Left ureteral stent in place. Mild left hydronephrosis which has progressed from prior exam. Multiple irregular stones in the upper left kidney, including curvilinear stone measuring 2.3 cm. Subcapsular collection in the upper right kidney measures approximately 2.7 x 5.9 cm, previously less well-defined. Left anterior perirenal fluid collection measures approximately 12.3 x 2.2 cm, series 2, image 46. Cranial caudal length of 9.8 cm, with irregular shape and fluid collection coursing in the pericolic gutter and anterior retroperitoneum. Mild adjacent fat stranding. This is not significantly changed from prior exam. Distended urinary bladder. No bladder wall thickening. Stomach/Bowel: Enteric tube tip is in the stomach. The stomach is decompressed. No small bowel obstruction or inflammation. Normal appendix. Fluid/liquid colon in the cecum and ascending colon. More distal colon is nondistended. Occasional sigmoid diverticulosis without diverticulitis. Rectal tube is in place. Vascular/Lymphatic: Moderate aortic atherosclerosis. No aortic aneurysm scattered retroperitoneal lymph nodes are prominent, but not enlarged by size criteria. Reproductive: Anteverted uterus which is heterogeneous with small calcifications suggesting fibroids. Stable appearance from prior. No suspicious adnexal mass. Other: Left anterior perirenal collection which appears to be contiguous with the subcapsular collection about the left kidney as described above. This collection extends in the anterior pararenal space and retroperitoneum with adjacent fat stranding. Distal extent into the pericolic gutter. There is no internal air. Sterility is indeterminate by imaging. Previous  right lower quadrant drainage catheter has been removed. No evidence of recurrence right-sided collection. No free air. Soft  tissue densities in the anterior abdominal wall typical of medication injection sites. Musculoskeletal: Stable from prior. Degenerative change in the spine. IMPRESSION: 1. Left anterior perirenal fluid collection which appears to be contiguous with the subcapsular collection about the upper left kidney. This collection extends in the anterior pararenal space and retroperitoneum with adjacent fat stranding. This is not significantly changed in size from prior exam. Sterility is indeterminate by imaging. 2. Right-sided ureteral stent in place. The degree of right hydronephrosis has increased from most recent CT. Left ureteral stent in place. Mild left hydronephrosis which has progressed from prior exam. 3. Bilateral nonobstructing nephrolithiasis. 4. Fluid/liquid colon in the cecum and ascending colon, can be seen with diarrheal illness. 5. Slight improvement in bilateral pleural effusions and bibasilar atelectasis from prior exam. Aortic Atherosclerosis (ICD10-I70.0). Electronically Signed   By: Keith Rake M.D.   On: 07/20/2021 15:26   CT IMAGE GUIDED DRAINAGE BY PERCUTANEOUS CATHETER  Result Date: 07/21/2021 INDICATION: Perirenal abscess, continued fevers EXAM: CT GUIDED DRAINAGE OF left perirenal ABSCESS MEDICATIONS: The patient is currently admitted to the hospital and receiving intravenous antibiotics. The antibiotics were administered within an appropriate time frame prior to the initiation of the procedure. ANESTHESIA/SEDATION: Local analgesia COMPLICATIONS: None immediate. TECHNIQUE: Informed written consent was obtained from the patient after a thorough discussion of the procedural risks, benefits and alternatives. All questions were addressed. Maximal Sterile Barrier Technique was utilized including caps, mask, sterile gowns, sterile gloves, sterile drape, hand hygiene and  skin antiseptic. A timeout was performed prior to the initiation of the procedure. PROCEDURE: The patient was placed supine on the exam table. Limited CT of the abdomen was performed for planning purposes. This again demonstrated left perinephric fullness suggestive of an abscess. It does appear relatively decreased in size however, given the patient's severity of illness, the decision was made to proceed with aspiration and possible drain placement. Skin entry site was marked, and the overlying skin was prepped and draped in a standard sterile fashion. Local analgesia was obtained with 1% lidocaine. Using CT fluoroscopy, a 19 gauge Yueh catheter was advanced into the left perirenal abscess. There was return of purulent material. An Amplatz wire was then advanced through the [place] catheter, followed by serial tract dilation and placement of a 12 French multipurpose locking drainage catheter into the perirenal abscess. Location was confirmed with CT. A total of approximately 5 mL of frankly purulent material was aspirated, and sent to the lab for analysis. The drainage catheter was secured to the skin using silk suture. It was attached to bulb suction. A clean dressing was placed. The patient tolerated the procedure well without immediate complication. FINDINGS: 39 French multipurpose locking drainage catheter placed into the left perirenal abscess. 5 mL of purulent material was aspirated and sent to the lab. IMPRESSION: Successful CT-guided placement of a 12 French locking drainage catheter into the left perirenal abscess. A total of approximately 5 mL of purulent material was aspirated, and sent to the lab for analysis. Electronically Signed   By: Albin Felling M.D.   On: 07/21/2021 16:16        Scheduled Meds:  acetaminophen (TYLENOL) oral liquid 160 mg/5 mL  650 mg Per Tube Q24H   amLODipine  10 mg Per Tube Daily   chlorhexidine  15 mL Mouth Rinse BID   Chlorhexidine Gluconate Cloth  6 each Topical  Q0600   cloNIDine  0.1 mg Per Tube Daily   dextrose  10 mL Intravenous Q24H   dextrose  10 mL Intravenous Q24H   epoetin (EPOGEN/PROCRIT) injection  10,000 Units Intravenous Q M,W,F-HD   famotidine  20 mg Per Tube QHS   feeding supplement (PROSource TF)  45 mL Per Tube BID   free water  200 mL Per Tube Q4H   heparin injection (subcutaneous)  5,000 Units Subcutaneous Q8H   insulin aspart  0-20 Units Subcutaneous Q4H   insulin aspart  6 Units Subcutaneous Q4H   insulin detemir  30 Units Subcutaneous BID   mouth rinse  15 mL Mouth Rinse q12n4p   metoprolol tartrate  100 mg Per Tube BID   multivitamin  1 tablet Per Tube QHS   nutrition supplement (JUVEN)  1 packet Per Tube BID BM   sodium chloride  500 mL Intravenous Q24H   sodium chloride  500 mL Intravenous Q24H   sodium chloride flush  5 mL Intracatheter Q8H   Continuous Infusions:  amphotericin B  Conventional (FUNGIZONE) IV Stopped (07/22/21 0201)   feeding supplement (NEPRO CARB STEADY) 50 mL/hr at 07/19/21 1500   piperacillin-tazobactam 3.375 g (07/22/21 0834)   potassium chloride 10 mEq (07/22/21 0933)     LOS: 25 days    Time spent: 35 minutes    Sharen Hones, MD [facility]   To contact the attending provider between 7A-7P or the covering provider during after hours 7P-7A, please log into the web site [email or web address] and access using universal [place] password for that web site. If you do not have the password, please call the hospital operator.  07/22/2021, 10:39 AM

## 2021-07-23 LAB — CBC WITH DIFFERENTIAL/PLATELET
Abs Immature Granulocytes: 0.84 10*3/uL — ABNORMAL HIGH (ref 0.00–0.07)
Basophils Absolute: 0 10*3/uL (ref 0.0–0.1)
Basophils Relative: 0 %
Eosinophils Absolute: 0.2 10*3/uL (ref 0.0–0.5)
Eosinophils Relative: 1 %
HCT: 18.6 % — ABNORMAL LOW (ref 36.0–46.0)
Hemoglobin: 5.8 g/dL — ABNORMAL LOW (ref 12.0–15.0)
Immature Granulocytes: 6 %
Lymphocytes Relative: 13 %
Lymphs Abs: 2 10*3/uL (ref 0.7–4.0)
MCH: 27.2 pg (ref 26.0–34.0)
MCHC: 31.2 g/dL (ref 30.0–36.0)
MCV: 87.3 fL (ref 80.0–100.0)
Monocytes Absolute: 0.8 10*3/uL (ref 0.1–1.0)
Monocytes Relative: 6 %
Neutro Abs: 11.3 10*3/uL — ABNORMAL HIGH (ref 1.7–7.7)
Neutrophils Relative %: 74 %
Platelets: 274 10*3/uL (ref 150–400)
RBC: 2.13 MIL/uL — ABNORMAL LOW (ref 3.87–5.11)
RDW: 15.5 % (ref 11.5–15.5)
Smear Review: NORMAL
WBC: 15.2 10*3/uL — ABNORMAL HIGH (ref 4.0–10.5)
nRBC: 1.3 % — ABNORMAL HIGH (ref 0.0–0.2)

## 2021-07-23 LAB — GLUCOSE, CAPILLARY
Glucose-Capillary: 241 mg/dL — ABNORMAL HIGH (ref 70–99)
Glucose-Capillary: 146 mg/dL — ABNORMAL HIGH (ref 70–99)
Glucose-Capillary: 164 mg/dL — ABNORMAL HIGH (ref 70–99)
Glucose-Capillary: 166 mg/dL — ABNORMAL HIGH (ref 70–99)
Glucose-Capillary: 149 mg/dL — ABNORMAL HIGH (ref 70–99)
Glucose-Capillary: 187 mg/dL — ABNORMAL HIGH (ref 70–99)

## 2021-07-23 LAB — BASIC METABOLIC PANEL
Anion gap: 13 (ref 5–15)
BUN: 60 mg/dL — ABNORMAL HIGH (ref 8–23)
CO2: 25 mmol/L (ref 22–32)
Calcium: 8.9 mg/dL (ref 8.9–10.3)
Chloride: 100 mmol/L (ref 98–111)
Creatinine, Ser: 2.55 mg/dL — ABNORMAL HIGH (ref 0.44–1.00)
GFR, Estimated: 19 mL/min — ABNORMAL LOW (ref >60)
Glucose, Bld: 178 mg/dL — ABNORMAL HIGH (ref 70–99)
Potassium: 3.1 mmol/L — ABNORMAL LOW (ref 3.5–5.1)
Sodium: 138 mmol/L (ref 135–145)

## 2021-07-23 LAB — PREPARE RBC (CROSSMATCH)

## 2021-07-23 LAB — MAGNESIUM: Magnesium: 2.2 mg/dL (ref 1.7–2.4)

## 2021-07-23 MED ORDER — FUROSEMIDE 10 MG/ML IJ SOLN
80.0000 mg | Freq: Once | INTRAMUSCULAR | Status: AC
  Administered 2021-07-23: 80 mg via INTRAVENOUS
  Filled 2021-07-23: qty 8

## 2021-07-23 MED ORDER — POTASSIUM CHLORIDE 10 MEQ/100ML IV SOLN
10.0000 meq | INTRAVENOUS | Status: AC
Start: 2021-07-23 — End: 2021-07-23
  Administered 2021-07-23 (×4): 10 meq via INTRAVENOUS
  Filled 2021-07-23 (×4): qty 100

## 2021-07-23 MED ORDER — SODIUM CHLORIDE 0.9% IV SOLUTION: Freq: Once | INTRAVENOUS | Status: DC

## 2021-07-23 NOTE — TOC Progression Note (Signed)
Transition of Care [facility]) - Progression Note    Patient Details  Name: Heather Lopez MRN: [id number] Date of Birth: 03-17-1946  Transition of Care [facility]) CM/SW [place], [place] Phone Number: [phone] 07/23/2021, 2:44 PM  Clinical Narrative:     PT has recommended LTACH placement.  CSW left voicemail for [name], [name] (Spouse) [phone] [facility] Phone) to speak about discharge planning.    Expected Discharge Plan: [place] Barriers to Discharge: Continued Medical Work up  Expected Discharge Plan and Services Expected Discharge Plan: [place] In-house Referral: Clinical Social Work   Post Acute Care Choice: [place] Living arrangements for the past 2 months: Single Family Home                                       Social Determinants of Health (SDOH) Interventions    Readmission Risk Interventions No flowsheet data found.

## 2021-07-23 NOTE — Progress Notes (Signed)
PROGRESS NOTE    Heather Lopez  YPP:[id number] DOB: February 24, 1946 DOA: 06/27/2021 PCP: [name], PA-C   Chief complaint.  Shortness of breath. Brief Narrative:  Heather Lopez is a 75 y.o. female with history of hypertension, diabetes mellitus, hyperlipidemia has been feeling weak for the last 3 to 4 days.  Patient had a acute renal failure with creatinine of 9.3, potassium 7.5, acute metabolic acidosis with pH 7.15, procalcitonin 6.83.  Patient was diagnosed was septic shock, acute respiratory failure. On night of 8/4, patient developed acute abdominal pain, CT scan showed acute abdomen secondary to perforated duodenum.  Patient was placed on mechanical ventilation after duodenum repair. Patient had a bilateral hydronephrosis, ureteral stents was placed on 8/10. Patient urine culture was positive for Candida glabrata.  ID consult was obtained, patient currently on high-dose of Diflucan. 8/26. Due to persistent fever, CT scan of abdomen/pelvis was repeated on 8/26, aspiration pararenal fluids was obtained, removed 5 mL of pus.  ID changed antibiotic to Amphotericin in addition to Zosyn.     Assessment & Plan:   Active Problems:   Uncontrolled type 2 diabetes mellitus with insulin therapy ([place])   Hyperlipidemia associated with type 2 diabetes mellitus ([place])   Type 2 diabetes mellitus with stage 4 chronic kidney disease, with long-term current use of insulin (HCC)   Acute respiratory failure with hypoxia (HCC)   Severe sepsis with septic shock (HCC)   Acute lower UTI   Acute kidney injury superimposed on chronic kidney disease (HCC)   Pneumoperitoneum   Pressure injury of skin   Atrial flutter with rapid ventricular response (HCC)   Endotracheally intubated   Bilateral nephrolithiasis   Anemia of chronic disease   Demand ischemia of myocardium (HCC)   Lethargy   Duodenal ulcer perforation ([place])   Renal abscess   Essential hypertension   Hypokalemia  Acute metabolic  encephalopathy. Sepsis with septic shock secondary to obstructive uropathy, kidney stone.  Status post bilateral ureteral stents. Candida urinary tract infection Perforated duodenum. S/p repair. Perirenal abscess. Has not had a fever for the last 2 days, her mental status seem to be better.  Culture from pararenal abscess so far has no growth. I will continue antibiotics with Zosyn and Amphotericin.   Acute hypoxemic respiratory failure secondary to septic shock. Morbid obesity and obesity hypoventilation syndrome  Continue wean oxygen, oxygenation seem to be better.   Acute kidney injury on chronic kidney disease stage IV. Hypokalemia. Iron deficiency anemia Patient is on scheduled dialysis. Patient has received IV iron for iron deficient anemia, B12 level borderline, received a B12 injection, homocystine still pending.  I will give another dose of B12 shots.  Hemoglobin dropped down to 5.8, will transfuse 2 units PRBC.  80 mg IV Lasix between 2 units.  Patient does not have any evidence of acute bleeding. Give another 30 mEq KCl IV for hypokalemia.  Uncontrolled type 2 diabetes with hyperglycemia Continue current regimen.  DVT prophylaxis: Heparin Code Status: DNR Family Communication:  Disposition Plan:    Status is: Inpatient  Remains inpatient appropriate because:IV treatments appropriate due to intensity of illness or inability to take PO and Inpatient level of care appropriate due to severity of illness  Dispo: The patient is from: Home              Anticipated d/c is to: SNF              Patient currently is not medically stable to d/c.   Difficult  to place patient No        I/O last 3 completed shifts: In: 2950.4 [I.V.:57.4; NG/GT:640; IV Piggyback:2253.1] Out: 3560 [Urine:2245; Drains:15; VPXTG:6269] No intake/output data recorded.     Consultants:  Nephrology, ID  Procedures: HD  Antimicrobials: Zosyn and Amphotericin  Subjective: Patient no  longer has any fever or chills.  Still on high flow oxygen, short of breath with minimal exertion. She has no abdominal pain or nausea vomiting.  Ostomy output does not seem to be bloody or black.  Tolerating tube feeding No headache or dizziness. No chest pain palpitation. Foley in place, no dysuria.  Objective: Vitals:   07/23/21 0400 07/23/21 0900 07/23/21 1100 07/23/21 1200  BP: (!) 163/54   (!) 139/57  Pulse: 89 86 88 84  Resp: (!) 26 (!) 24 (!) 24 (!) 24  Temp: 97.6 F (36.4 C)   99.1 F (37.3 C)  TempSrc: Axillary     SpO2: 91% 95% 93% 91%  Weight:      Height:        Intake/Output Summary (Last 24 hours) at 07/23/2021 1244 Last data filed at 07/23/2021 0643 Gross per 24 hour  Intake 1897.58 ml  Output 1805 ml  Net 92.58 ml   Filed Weights   07/17/21 0500 07/18/21 0500 07/22/21 0500  Weight: 125.5 kg 125.9 kg 125.9 kg    Examination:  General exam: Appears calm and comfortable, morbid obese  Respiratory system: Clear to auscultation. Respiratory effort normal. Cardiovascular system: S1 & S2 heard, RRR. No JVD, murmurs, rubs, gallops or clicks. No pedal edema. Gastrointestinal system: Abdomen is nondistended, soft and nontender. No organomegaly or masses felt. Normal bowel sounds heard. Central nervous system: Drowsy and oriented x2. No focal neurological deficits. Extremities: Symmetric 5 x 5 power. Skin: No rashes, lesions or ulcers Psychiatry: Mood & affect appropriate.     Data Reviewed: I have personally reviewed following labs and imaging studies  CBC: Recent Labs  Lab 07/16/21 2217 07/17/21 0434 07/18/21 0511 07/19/21 0449 07/20/21 0601 07/21/21 0528 07/23/21 0634  WBC 6.3   < > 7.9 7.8 10.7* 15.4* 15.2*  NEUTROABS 4.5  --   --   --   --  13.2* 11.3*  HGB 7.9*   < > 7.9* 7.8* 8.6* 7.4* 5.8*  HCT 25.4*   < > 26.2* 25.3* 28.5* 23.5* 18.6*  MCV 89.1   < > 87.3 86.1 91.6 85.8 87.3  PLT 328   < > 379 358 202 303 274   < > = values in this interval  not displayed.   Basic Metabolic Panel: Recent Labs  Lab 07/17/21 0434 07/18/21 0511 07/19/21 0449 07/20/21 0601 07/21/21 0528 07/22/21 0516 07/22/21 1739 07/23/21 0634  NA 149* 144 141 136 137 135  --  138  K 3.6 3.4* 3.2* 4.1 2.9* 2.8* 3.0* 3.1*  CL 111 98 100 99 99 97*  --  100  CO2 31 32 30 25 28 28   --  25  GLUCOSE 109* 232* 251* 190* 223* 154*  --  178*  BUN 83* 71* 87* 55* 60* 41*  --  60*  CREATININE 2.02* 1.82* 2.08* 2.04* 2.35* 1.83*  --  2.55*  CALCIUM 8.8* 9.0 9.1 8.9 8.9 8.5*  --  8.9  MG 2.0 1.9 2.2 1.9 1.9 1.8 2.0 2.2  PHOS 2.8 2.7 3.6 2.7  --   --   --   --    GFR: Estimated Creatinine Clearance: 24.6 mL/min (A) (by C-G formula based  on SCr of 2.55 mg/dL (H)). Liver Function Tests: Recent Labs  Lab 07/16/21 2217 07/17/21 0434 07/21/21 0528  AST 50* 52* 33  ALT 92* 88* 53*  ALKPHOS 108 107 101  BILITOT 0.4 0.5 0.5  PROT 5.8* 6.0* 5.8*  ALBUMIN 1.7* 1.8* 1.6*   No results for input(s): LIPASE, AMYLASE in the last 168 hours. No results for input(s): AMMONIA in the last 168 hours. Coagulation Profile: Recent Labs  Lab 07/16/21 2217  INR 1.2   Cardiac Enzymes: No results for input(s): CKTOTAL, CKMB, CKMBINDEX, TROPONINI in the last 168 hours. BNP (last 3 results) No results for input(s): PROBNP in the last 8760 hours. HbA1C: No results for input(s): HGBA1C in the last 72 hours. CBG: Recent Labs  Lab 07/22/21 1955 07/22/21 2351 07/23/21 0338 07/23/21 0750 07/23/21 1243  GLUCAP 182* 274* 241* 146* 164*   Lipid Profile: No results for input(s): CHOL, HDL, LDLCALC, TRIG, CHOLHDL, LDLDIRECT in the last 72 hours. Thyroid Function Tests: No results for input(s): TSH, T4TOTAL, FREET4, T3FREE, THYROIDAB in the last 72 hours. Anemia Panel: No results for input(s): VITAMINB12, FOLATE, FERRITIN, TIBC, IRON, RETICCTPCT in the last 72 hours. Sepsis Labs: Recent Labs  Lab 07/16/21 2217 07/17/21 0026 07/18/21 0511  PROCALCITON 1.54  --  1.88   LATICACIDVEN 1.9 1.7  --     Recent Results (from the past 240 hour(s))  CULTURE, BLOOD (ROUTINE X 2) w Reflex to ID Panel     Status: None   Collection Time: 07/15/21  7:57 PM   Specimen: BLOOD  Result Value Ref Range Status   Specimen Description BLOOD LEFT ASSIST CONTROL  Final   Special Requests   Final    BOTTLES DRAWN AEROBIC AND ANAEROBIC Blood Culture adequate volume   Culture   Final    NO GROWTH 5 DAYS Performed at [facility], [address] [place], [place] [address]    Report Status 07/20/2021 FINAL  Final  CULTURE, BLOOD (ROUTINE X 2) w Reflex to ID Panel     Status: None   Collection Time: 07/15/21  8:07 PM   Specimen: BLOOD  Result Value Ref Range Status   Specimen Description BLOOD LEFT FOREARM  Final   Special Requests   Final    BOTTLES DRAWN AEROBIC AND ANAEROBIC Blood Culture adequate volume   Culture   Final    NO GROWTH 5 DAYS Performed at [facility], [address], [place], [place] [address]    Report Status 07/20/2021 FINAL  Final  Body fluid culture w Gram Stain     Status: None (Preliminary result)   Collection Time: 07/21/21  1:23 PM   Specimen: Peritoneal Washings; Body Fluid  Result Value Ref Range Status   Specimen Description   Final    PERITONEAL Performed at [facility], [address] [place], [place] [address]    Special Requests   Final    NONE Performed at [facility], [address] [place], [place] [address]    Gram Stain   Final    MODERATE WBC PRESENT,BOTH PMN AND MONONUCLEAR NO ORGANISMS SEEN    Culture   Final    NO GROWTH 2 DAYS Performed at [facility], [place] [address] [place], [place] [address]    Report Status PENDING  Incomplete  Aerobic/Anaerobic Culture w Gram Stain (surgical/deep wound)     Status: None (Preliminary result)   Collection Time: 07/21/21  3:32 PM   Specimen: Abdomen  Result  Value Ref Range Status   Specimen Description   Final     ABDOMEN Performed at [facility] [address] [place], [place] [address]    Special Requests   Final    NONE Performed at [facility], [place], [place] [address]    Gram Stain   Final    MODERATE SQUAMOUS EPITHELIAL CELLS PRESENT WBC PRESENT, PREDOMINANTLY PMN NO ORGANISMS SEEN    Culture   Final    NO GROWTH 2 DAYS Performed at [facility], [place] [address], [place], [place] [address]    Report Status PENDING  Incomplete         Radiology Studies: CT IMAGE GUIDED DRAINAGE BY PERCUTANEOUS CATHETER  Result Date: 07/21/2021 INDICATION: Perirenal abscess, continued fevers EXAM: CT GUIDED DRAINAGE OF left perirenal ABSCESS MEDICATIONS: The patient is currently admitted to the hospital and receiving intravenous antibiotics. The antibiotics were administered within an appropriate time frame prior to the initiation of the procedure. ANESTHESIA/SEDATION: Local analgesia COMPLICATIONS: None immediate. TECHNIQUE: Informed written consent was obtained from the patient after a thorough discussion of the procedural risks, benefits and alternatives. All questions were addressed. Maximal Sterile Barrier Technique was utilized including caps, mask, sterile gowns, sterile gloves, sterile drape, hand hygiene and skin antiseptic. A timeout was performed prior to the initiation of the procedure. PROCEDURE: The patient was placed supine on the exam table. Limited CT of the abdomen was performed for planning purposes. This again demonstrated left perinephric fullness suggestive of an abscess. It does appear relatively decreased in size however, given the patient's severity of illness, the decision was made to proceed with aspiration and possible drain placement. Skin entry site was marked, and the overlying skin was prepped and draped in a standard sterile fashion. Local analgesia was obtained with 1% lidocaine. Using CT fluoroscopy, a 19 gauge Yueh catheter was  advanced into the left perirenal abscess. There was return of purulent material. An Amplatz wire was then advanced through the [place] catheter, followed by serial tract dilation and placement of a 12 French multipurpose locking drainage catheter into the perirenal abscess. Location was confirmed with CT. A total of approximately 5 mL of frankly purulent material was aspirated, and sent to the lab for analysis. The drainage catheter was secured to the skin using silk suture. It was attached to bulb suction. A clean dressing was placed. The patient tolerated the procedure well without immediate complication. FINDINGS: 3 French multipurpose locking drainage catheter placed into the left perirenal abscess. 5 mL of purulent material was aspirated and sent to the lab. IMPRESSION: Successful CT-guided placement of a 12 French locking drainage catheter into the left perirenal abscess. A total of approximately 5 mL of purulent material was aspirated, and sent to the lab for analysis. Electronically Signed   By: Albin Felling M.D.   On: 07/21/2021 16:16        Scheduled Meds:  sodium chloride   Intravenous Once   acetaminophen (TYLENOL) oral liquid 160 mg/5 mL  650 mg Per Tube Q24H   amLODipine  10 mg Per Tube Daily   chlorhexidine  15 mL Mouth Rinse BID   Chlorhexidine Gluconate Cloth  6 each Topical Q0600   cloNIDine  0.1 mg Per Tube Daily   dextrose  10 mL Intravenous Q24H   dextrose  10 mL Intravenous Q24H   epoetin (EPOGEN/PROCRIT) injection  10,000 Units Intravenous Q M,W,F-HD   famotidine  20 mg Per Tube QHS  feeding supplement (PROSource TF)  45 mL Per Tube BID   free water  200 mL Per Tube Q4H   furosemide  80 mg Intravenous Once   heparin injection (subcutaneous)  5,000 Units Subcutaneous Q8H   insulin aspart  0-20 Units Subcutaneous Q4H   insulin aspart  6 Units Subcutaneous Q4H   insulin detemir  30 Units Subcutaneous BID   mouth rinse  15 mL Mouth Rinse q12n4p   metoprolol tartrate  100  mg Per Tube BID   multivitamin  1 tablet Per Tube QHS   nutrition supplement (JUVEN)  1 packet Per Tube BID BM   sodium chloride  500 mL Intravenous Q24H   sodium chloride  500 mL Intravenous Q24H   sodium chloride flush  5 mL Intracatheter Q8H   Continuous Infusions:  sodium chloride Stopped (07/23/21 0603)   amphotericin B  Conventional (FUNGIZONE) IV Stopped (07/23/21 0158)   feeding supplement (NEPRO CARB STEADY) Stopped (07/23/21 0100)   piperacillin-tazobactam 3.375 g (07/23/21 0954)   potassium chloride 10 mEq (07/23/21 1213)     LOS: 26 days    Time spent: 35 minutes    Sharen Hones, MD [facility]   To contact the attending provider between 7A-7P or the covering provider during after hours 7P-7A, please log into the web site [email or web address] and access using universal  password for that web site. If you do not have the password, please call the hospital operator.  07/23/2021, 12:44 PM

## 2021-07-23 NOTE — Progress Notes (Signed)
[facility]  PROGRESS NOTE   Subjective:   Patient seen in the ICU. Awake today.  Objective:  Vital signs in last 24 hours:  Temp:  [97.6 F (36.4 C)-99.6 F (37.6 C)] 97.6 F (36.4 C) (08/28 0400) Pulse Rate:  [79-89] 89 (08/28 0400) Resp:  [20-31] 26 (08/28 0400) BP: (123-163)/(45-59) 163/54 (08/28 0400) SpO2:  [90 %-100 %] 91 % (08/28 0400)  Weight change:  Filed Weights   07/17/21 0500 07/18/21 0500 07/22/21 0500  Weight: 125.5 kg 125.9 kg 125.9 kg    Intake/Output: I/O last 3 completed shifts: In: 2950.4 [I.V.:57.4; NG/GT:640; IV Piggyback:2253.1] Out: 3560 [Urine:2245; Drains:15; BSJGG:8366]   Intake/Output this shift:  No intake/output data recorded.  Physical Exam: General:  No acute distress  Head:  Normocephalic, atraumatic. Moist oral mucosal membranes  Eyes:  Anicteric  Neck:  Supple  Lungs:   Clear to auscultation, normal effort  Heart:  S1S2 no rubs  Abdomen:   Soft, nontender, bowel sounds present  Extremities:  peripheral edema.  Neurologic:  Awake, alert, following commands  Skin:  No lesions  Access:     Basic Metabolic Panel: Recent Labs  Lab 07/17/21 0434 07/18/21 0511 07/19/21 0449 07/20/21 0601 07/21/21 0528 07/22/21 0516 07/22/21 1739 07/23/21 0634  NA 149* 144 141 136 137 135  --  138  K 3.6 3.4* 3.2* 4.1 2.9* 2.8* 3.0* 3.1*  CL 111 98 100 99 99 97*  --  100  CO2 31 32 30 25 28 28   --  25  GLUCOSE 109* 232* 251* 190* 223* 154*  --  178*  BUN 83* 71* 87* 55* 60* 41*  --  60*  CREATININE 2.02* 1.82* 2.08* 2.04* 2.35* 1.83*  --  2.55*  CALCIUM 8.8* 9.0 9.1 8.9 8.9 8.5*  --  8.9  MG 2.0 1.9 2.2 1.9 1.9 1.8 2.0 2.2  PHOS 2.8 2.7 3.6 2.7  --   --   --   --     CBC: Recent Labs  Lab 07/16/21 2217 07/17/21 0434 07/18/21 0511 07/19/21 0449 07/20/21 0601 07/21/21 0528 07/23/21 0634  WBC 6.3   < > 7.9 7.8 10.7* 15.4* 15.2*  NEUTROABS 4.5  --   --   --   --  13.2* 11.3*  HGB 7.9*   < > 7.9* 7.8* 8.6* 7.4* 5.8*   HCT 25.4*   < > 26.2* 25.3* 28.5* 23.5* 18.6*  MCV 89.1   < > 87.3 86.1 91.6 85.8 87.3  PLT 328   < > 379 358 202 303 274   < > = values in this interval not displayed.     Urinalysis: No results for input(s): COLORURINE, LABSPEC, PHURINE, GLUCOSEU, HGBUR, BILIRUBINUR, KETONESUR, PROTEINUR, UROBILINOGEN, NITRITE, LEUKOCYTESUR in the last 72 hours.  Invalid input(s): APPERANCEUR    Imaging: CT IMAGE GUIDED DRAINAGE BY PERCUTANEOUS CATHETER  Result Date: 07/21/2021 INDICATION: Perirenal abscess, continued fevers EXAM: CT GUIDED DRAINAGE OF left perirenal ABSCESS MEDICATIONS: The patient is currently admitted to the hospital and receiving intravenous antibiotics. The antibiotics were administered within an appropriate time frame prior to the initiation of the procedure. ANESTHESIA/SEDATION: Local analgesia COMPLICATIONS: None immediate. TECHNIQUE: Informed written consent was obtained from the patient after a thorough discussion of the procedural risks, benefits and alternatives. All questions were addressed. Maximal Sterile Barrier Technique was utilized including caps, mask, sterile gowns, sterile gloves, sterile drape, hand hygiene and skin antiseptic. A timeout was performed prior to the initiation of the procedure. PROCEDURE: The patient  was placed supine on the exam table. Limited CT of the abdomen was performed for planning purposes. This again demonstrated left perinephric fullness suggestive of an abscess. It does appear relatively decreased in size however, given the patient's severity of illness, the decision was made to proceed with aspiration and possible drain placement. Skin entry site was marked, and the overlying skin was prepped and draped in a standard sterile fashion. Local analgesia was obtained with 1% lidocaine. Using CT fluoroscopy, a 19 gauge Yueh catheter was advanced into the left perirenal abscess. There was return of purulent material. An Amplatz wire was then advanced  through the [place] catheter, followed by serial tract dilation and placement of a 12 French multipurpose locking drainage catheter into the perirenal abscess. Location was confirmed with CT. A total of approximately 5 mL of frankly purulent material was aspirated, and sent to the lab for analysis. The drainage catheter was secured to the skin using silk suture. It was attached to bulb suction. A clean dressing was placed. The patient tolerated the procedure well without immediate complication. FINDINGS: 72 French multipurpose locking drainage catheter placed into the left perirenal abscess. 5 mL of purulent material was aspirated and sent to the lab. IMPRESSION: Successful CT-guided placement of a 12 French locking drainage catheter into the left perirenal abscess. A total of approximately 5 mL of purulent material was aspirated, and sent to the lab for analysis. Electronically Signed   By: Albin Felling M.D.   On: 07/21/2021 16:16     Medications:    sodium chloride Stopped (07/23/21 0603)   amphotericin B  Conventional (FUNGIZONE) IV Stopped (07/23/21 0158)   feeding supplement (NEPRO CARB STEADY) Stopped (07/23/21 0100)   piperacillin-tazobactam 3.375 g (07/23/21 0954)   potassium chloride 10 mEq (07/23/21 0958)    sodium chloride   Intravenous Once   acetaminophen (TYLENOL) oral liquid 160 mg/5 mL  650 mg Per Tube Q24H   amLODipine  10 mg Per Tube Daily   chlorhexidine  15 mL Mouth Rinse BID   Chlorhexidine Gluconate Cloth  6 each Topical Q0600   cloNIDine  0.1 mg Per Tube Daily   dextrose  10 mL Intravenous Q24H   dextrose  10 mL Intravenous Q24H   epoetin (EPOGEN/PROCRIT) injection  10,000 Units Intravenous Q M,W,F-HD   famotidine  20 mg Per Tube QHS   feeding supplement (PROSource TF)  45 mL Per Tube BID   free water  200 mL Per Tube Q4H   furosemide  80 mg Intravenous Once   heparin injection (subcutaneous)  5,000 Units Subcutaneous Q8H   insulin aspart  0-20 Units Subcutaneous Q4H    insulin aspart  6 Units Subcutaneous Q4H   insulin detemir  30 Units Subcutaneous BID   mouth rinse  15 mL Mouth Rinse q12n4p   metoprolol tartrate  100 mg Per Tube BID   multivitamin  1 tablet Per Tube QHS   nutrition supplement (JUVEN)  1 packet Per Tube BID BM   sodium chloride  500 mL Intravenous Q24H   sodium chloride  500 mL Intravenous Q24H   sodium chloride flush  5 mL Intracatheter Q8H    Assessment/ Plan:     Active Problems:   Uncontrolled type 2 diabetes mellitus with insulin therapy ([place])   Hyperlipidemia associated with type 2 diabetes mellitus ([place])   Type 2 diabetes mellitus with stage 4 chronic kidney disease, with long-term current use of insulin ([place])   Acute respiratory failure with hypoxia ([place])  Severe sepsis with septic shock (HCC)   Acute lower UTI   Acute kidney injury superimposed on chronic kidney disease (HCC)   Pneumoperitoneum   Pressure injury of skin   Atrial flutter with rapid ventricular response (HCC)   Endotracheally intubated   Bilateral nephrolithiasis   Anemia of chronic disease   Demand ischemia of myocardium (HCC)   Lethargy   Duodenal ulcer perforation (HCC)   Renal abscess   Essential hypertension   Hypokalemia  75 year old white female with history of hypertension, diabetes, hyperlipidemia, coronary artery disease, nephrolithiasis, gout, depression now admitted with history of acute respiratory failure and acute kidney injury along with hyperkalemia.   #1: Acute kidney injury on the top of chronic kidney disease.  This is probably secondary to obstructive uropathy complicated with sepsis.  She is presently s/p ureteral stents and acute kidney injury has been improving.  Urine output is much improving.  We will continue to hold the metformin and losartan for the time being.   #2: End-stage renal disease: Acute on chronic kidney disease.  Will hold dialysis and monitor urine output closely.   #3: Anemia: Patient has anemia secondary  to chronic kidney disease.  We will continue to monitor closely.   #4: Urosepsis: Continue antibiotics as per infectious disease specialist.   #5: Nephrolithiasis and obstructive uropathy: S/p ureteral stents.  #6: Hypokalemia: Supplement potassium as ordered.   We will continue to monitor closely during the hospitalization and give recommendations as needed.   LOS: [place], [facility] 8/28/202210:01 AM

## 2021-07-24 ENCOUNTER — Inpatient Hospital Stay: Payer: Medicare Other

## 2021-07-24 DIAGNOSIS — N151 Renal and perinephric abscess: Secondary | ICD-10-CM | POA: Diagnosis not present

## 2021-07-24 DIAGNOSIS — N390 Urinary tract infection, site not specified: Secondary | ICD-10-CM | POA: Diagnosis not present

## 2021-07-24 LAB — BASIC METABOLIC PANEL
Anion gap: 11 (ref 5–15)
BUN: 65 mg/dL — ABNORMAL HIGH (ref 8–23)
CO2: 23 mmol/L (ref 22–32)
Calcium: 8.6 mg/dL — ABNORMAL LOW (ref 8.9–10.3)
Chloride: 101 mmol/L (ref 98–111)
Creatinine, Ser: 2.78 mg/dL — ABNORMAL HIGH (ref 0.44–1.00)
GFR, Estimated: 17 mL/min — ABNORMAL LOW (ref >60)
Glucose, Bld: 96 mg/dL (ref 70–99)
Potassium: 3.1 mmol/L — ABNORMAL LOW (ref 3.5–5.1)
Sodium: 135 mmol/L (ref 135–145)

## 2021-07-24 LAB — CBC WITH DIFFERENTIAL/PLATELET
Abs Immature Granulocytes: 0.72 10*3/uL — ABNORMAL HIGH (ref 0.00–0.07)
Basophils Absolute: 0.1 10*3/uL (ref 0.0–0.1)
Basophils Relative: 0 %
Eosinophils Absolute: 0.2 10*3/uL (ref 0.0–0.5)
Eosinophils Relative: 1 %
HCT: 21.1 % — ABNORMAL LOW (ref 36.0–46.0)
Hemoglobin: 7.2 g/dL — ABNORMAL LOW (ref 12.0–15.0)
Immature Granulocytes: 5 %
Lymphocytes Relative: 9 %
Lymphs Abs: 1.4 10*3/uL (ref 0.7–4.0)
MCH: 29.6 pg (ref 26.0–34.0)
MCHC: 34.1 g/dL (ref 30.0–36.0)
MCV: 86.8 fL (ref 80.0–100.0)
Monocytes Absolute: 0.8 10*3/uL (ref 0.1–1.0)
Monocytes Relative: 5 %
Neutro Abs: 12.4 10*3/uL — ABNORMAL HIGH (ref 1.7–7.7)
Neutrophils Relative %: 80 %
Platelets: 225 10*3/uL (ref 150–400)
RBC: 2.43 MIL/uL — ABNORMAL LOW (ref 3.87–5.11)
RDW: 14.8 % (ref 11.5–15.5)
WBC: 15.5 10*3/uL — ABNORMAL HIGH (ref 4.0–10.5)
nRBC: 1 % — ABNORMAL HIGH (ref 0.0–0.2)

## 2021-07-24 LAB — POTASSIUM: Potassium: 3.7 mmol/L (ref 3.5–5.1)

## 2021-07-24 LAB — MAGNESIUM: Magnesium: 1.9 mg/dL (ref 1.7–2.4)

## 2021-07-24 LAB — GLUCOSE, CAPILLARY
Glucose-Capillary: 144 mg/dL — ABNORMAL HIGH (ref 70–99)
Glucose-Capillary: 83 mg/dL (ref 70–99)
Glucose-Capillary: 143 mg/dL — ABNORMAL HIGH (ref 70–99)
Glucose-Capillary: 159 mg/dL — ABNORMAL HIGH (ref 70–99)
Glucose-Capillary: 151 mg/dL — ABNORMAL HIGH (ref 70–99)
Glucose-Capillary: 160 mg/dL — ABNORMAL HIGH (ref 70–99)

## 2021-07-24 MED ORDER — FUROSEMIDE 10 MG/ML IJ SOLN
80.0000 mg | Freq: Once | INTRAMUSCULAR | Status: AC
  Administered 2021-07-24: 80 mg via INTRAVENOUS
  Filled 2021-07-24: qty 8

## 2021-07-24 MED ORDER — FREE WATER
125.0000 mL | Status: DC
  Administered 2021-07-24 – 2021-08-03 (×45): 125 mL

## 2021-07-24 MED ORDER — EPOETIN ALFA 40000 UNIT/ML IJ SOLN
20000.0000 [IU] | INTRAMUSCULAR | Status: DC
  Administered 2021-07-29: 20000 [IU] via SUBCUTANEOUS
  Filled 2021-07-24 (×3): qty 1

## 2021-07-24 MED ORDER — PANTOPRAZOLE SODIUM 40 MG IV SOLR
40.0000 mg | Freq: Two times a day (BID) | INTRAVENOUS | Status: DC
  Administered 2021-07-24 – 2021-08-02 (×19): 40 mg via INTRAVENOUS
  Filled 2021-07-24 (×19): qty 40

## 2021-07-24 MED ORDER — COLLAGENASE 250 UNIT/GM EX OINT
TOPICAL_OINTMENT | Freq: Every day | CUTANEOUS | Status: DC
  Administered 2021-07-30 – 2021-07-31 (×2): 1 via TOPICAL
  Filled 2021-07-24: qty 30

## 2021-07-24 MED ORDER — FUROSEMIDE 10 MG/ML IJ SOLN
80.0000 mg | Freq: Two times a day (BID) | INTRAMUSCULAR | Status: DC
  Administered 2021-07-25 – 2021-08-02 (×17): 80 mg via INTRAVENOUS
  Filled 2021-07-24 (×18): qty 8

## 2021-07-24 MED ORDER — GLUCERNA 1.5 CAL PO LIQD
1000.0000 mL | ORAL | Status: DC
  Administered 2021-07-24: 1000 mL

## 2021-07-24 MED ORDER — GLUCERNA 1.5 CAL PO LIQD
1000.0000 mL | ORAL | Status: DC
  Administered 2021-07-25 – 2021-07-30 (×4): 1000 mL

## 2021-07-24 MED ORDER — POTASSIUM CHLORIDE 10 MEQ/100ML IV SOLN
10.0000 meq | INTRAVENOUS | Status: AC
  Administered 2021-07-24 (×4): 10 meq via INTRAVENOUS
  Filled 2021-07-24 (×4): qty 100

## 2021-07-24 NOTE — Progress Notes (Signed)
Date of Admission:  06/27/2021   T  ID: Heather Lopez is a 75 y.o. female  Active Problems:   Uncontrolled type 2 diabetes mellitus with insulin therapy ([place])   Hyperlipidemia associated with type 2 diabetes mellitus ([place])   Type 2 diabetes mellitus with stage 4 chronic kidney disease, with long-term current use of insulin (HCC)   Acute respiratory failure with hypoxia (HCC)   Severe sepsis with septic shock (HCC)   Acute lower UTI   Acute kidney injury superimposed on chronic kidney disease (HCC)   Pneumoperitoneum   Pressure injury of skin   Atrial flutter with rapid ventricular response (HCC)   Endotracheally intubated   Bilateral nephrolithiasis   Anemia of chronic disease   Demand ischemia of myocardium (HCC)   Lethargy   Duodenal ulcer perforation (HCC)   Renal abscess   Essential hypertension   Hypokalemia    Subjective: Pt says she has pain inbetween her legs Rectal tube present More alert As per her nose her oxygen sat drops when she sleeps  Medications:   sodium chloride   Intravenous Once   acetaminophen (TYLENOL) oral liquid 160 mg/5 mL  650 mg Per Tube Q24H   amLODipine  10 mg Per Tube Daily   chlorhexidine  15 mL Mouth Rinse BID   Chlorhexidine Gluconate Cloth  6 each Topical Q0600   cloNIDine  0.1 mg Per Tube Daily   collagenase   Topical Daily   dextrose  10 mL Intravenous Q24H   dextrose  10 mL Intravenous Q24H   [START ON 07/28/2021] epoetin (EPOGEN/PROCRIT) injection  20,000 Units Subcutaneous Weekly   famotidine  20 mg Per Tube QHS   feeding supplement (PROSource TF)  45 mL Per Tube BID   free water  200 mL Per Tube Q4H   heparin injection (subcutaneous)  5,000 Units Subcutaneous Q8H   insulin aspart  0-20 Units Subcutaneous Q4H   insulin aspart  6 Units Subcutaneous Q4H   insulin detemir  30 Units Subcutaneous BID   mouth rinse  15 mL Mouth Rinse q12n4p   metoprolol tartrate  100 mg Per Tube BID   multivitamin  1 tablet Per Tube QHS   nutrition  supplement (JUVEN)  1 packet Per Tube BID BM   sodium chloride  500 mL Intravenous Q24H   sodium chloride  500 mL Intravenous Q24H   sodium chloride flush  5 mL Intracatheter Q8H    Objective: Vital signs in last 24 hours: Temp:  [98.5 F (36.9 C)-99.3 F (37.4 C)] 99.3 F (37.4 C) (08/29 0800) Pulse Rate:  [80-97] 97 (08/29 0800) Resp:  [9-27] 26 (08/29 0800) BP: (122-158)/(46-65) 147/59 (08/29 0800) SpO2:  [88 %-99 %] 88 % (08/29 0802)  PHYSICAL EXAM:  General: Alert, cooperative, no distress, lethargic NG tube Head: Normocephalic, without obvious abnormality, atraumatic. Eyes: Conjunctivae clear, anicteric sclerae. Pupils are equal Neck: Supple, symmetrical, no adenopathy, thyroid: non tender no carotid bruit and no JVD. Lungs: b/l air entry- decreased bases. Heart: irregular- well controlled Abdomen: Soft, lap scar foely Extremities: atraumatic, no cyanosis. No edema. No clubbing Skin: No rashes or lesions. Or bruising Lymph: Cervical, supraclavicular normal. Neurologic: cannot assess  Lab Results Recent Labs    07/23/21 0634 07/24/21 0629  WBC 15.2* 15.5*  HGB 5.8* 7.2*  HCT 18.6* 21.1*  NA 138 135  K 3.1* 3.1*  CL 100 101  CO2 25 23  BUN 60* 65*  CREATININE 2.55* 2.78*    Assessment/Plan: Complicated UTI  With left perierenal/renal abscess, b/l nephrolithiasis, hydronephrosis, stents urine culture taken  from the kidneys on 07/05/21 during the procedure grew candida glabrata- Fluconazole MIC 2 Was on  high dose fluconazole Repeat CT abdomen showed renal abscess- which was drained on 8/26 Started amphotericin on 07/21/21- called lab to ID the organism-will continue amphotericin until the culture result is available- if it is candida glabrata will continue ampho for a total of 10-14 days  AKI has been on dialysis- being on ampho day 4- cr is getting worse Watch closely  Duodenal perforation s/p surgery   Encephalopathy- fluctuates   Afib with RVR on  metoprolol   Acute hypoxic resp failure- had been intubated earlier in the stay- remains extubated for > 1 week Today needing more oxygen-  Morbid obesity with hypoventilation syndrome   DM- on insulin Severe anemia- suspect GI bleed    Discussed the management with care team

## 2021-07-24 NOTE — Progress Notes (Signed)
[facility]  ROUNDING NOTE   Subjective:   Heather Lopez is a 75 y.o. white female who has been admitted for community acquired pneumonia.    Hospital course complicated by duodenal perforation s/p repair on 06/30/2021 Requiring hemodialysis.  Left obstructive hydronephrosis and renal abscess, Patient underwent cystoscopy and b/l ureteral stent placement 8/10 8/26- CT guided aspiration of perirenal abscess- 5 ml purulent fluid aspirated Cultures neg so far  UOP has improved with foley placement Lab Results  Component Value Date   CREATININE 2.78 (H) 07/24/2021   CREATININE 2.55 (H) 07/23/2021   CREATININE 1.83 (H) 07/22/2021   08/28 0701 - 08/29 0700 In: 2941 [I.V.:105; Blood:305; NG/GT:200; IV Piggyback:2331] Out: 2565 [Urine:1500; Drains:15; Stool:1050]   Continues to have low-grade fever of 99 Received 2 unit blood transfusion and 80 mg of Lasix in between Also getting IV potassium replacement Tube feeds Nepro 50 cc/h via NG tube Free water flushes of 200 Tacy every 4 hours Nasal cannula 10 L/min oxygen supplementation BiPAP at night     Objective:  Vital signs in last 24 hours:  Temp:  [98.5 F (36.9 C)-99.3 F (37.4 C)] 99.3 F (37.4 C) (08/29 0800) Pulse Rate:  [80-97] 97 (08/29 0800) Resp:  [9-27] 26 (08/29 0800) BP: (122-158)/(46-65) 147/59 (08/29 0800) SpO2:  [88 %-99 %] 88 % (08/29 0802)  Weight change:  Filed Weights   07/17/21 0500 07/18/21 0500 07/22/21 0500  Weight: 125.5 kg 125.9 kg 125.9 kg    Intake/Output: I/O last 3 completed shifts: In: 4738.6 [I.V.:162.4; Blood:305; NG/GT:840; IV Piggyback:3431.2] Out: 3270 [DPOEU:2353; Drains:25; IRWER:1540]   Intake/Output this shift:  No intake/output data recorded.  Physical Exam: General: Critically ill  Head: [place] O2, NGT in place  Eyes: Anicteric  Lungs:  +coarse breath sounds  Heart: tachycardia  Abdomen:  +JP drains, midline incision  Extremities:  No  peripheral edema    Neurologic:  Following commands  Skin: No lesions  GU Foley with yellow urine  Access:  LIJ permcath 0/86    Basic Metabolic Panel: Recent Labs  Lab 07/18/21 0511 07/19/21 0449 07/20/21 0601 07/21/21 0528 07/22/21 0516 07/22/21 1739 07/23/21 0634 07/24/21 0629  NA 144 141 136 137 135  --  138 135  K 3.4* 3.2* 4.1 2.9* 2.8* 3.0* 3.1* 3.1*  CL 98 100 99 99 97*  --  100 101  CO2 32 30 25 28 28   --  25 23  GLUCOSE 232* 251* 190* 223* 154*  --  178* 96  BUN 71* 87* 55* 60* 41*  --  60* 65*  CREATININE 1.82* 2.08* 2.04* 2.35* 1.83*  --  2.55* 2.78*  CALCIUM 9.0 9.1 8.9 8.9 8.5*  --  8.9 8.6*  MG 1.9 2.2 1.9 1.9 1.8 2.0 2.2 1.9  PHOS 2.7 3.6 2.7  --   --   --   --   --      Liver Function Tests: Recent Labs  Lab 07/21/21 0528  AST 33  ALT 53*  ALKPHOS 101  BILITOT 0.5  PROT 5.8*  ALBUMIN 1.6*    No results for input(s): LIPASE, AMYLASE in the last 168 hours. No results for input(s): AMMONIA in the last 168 hours.  CBC: Recent Labs  Lab 07/19/21 0449 07/20/21 0601 07/21/21 0528 07/23/21 0634 07/24/21 0629  WBC 7.8 10.7* 15.4* 15.2* 15.5*  NEUTROABS  --   --  13.2* 11.3* 12.4*  HGB 7.8* 8.6* 7.4* 5.8* 7.2*  HCT 25.3* 28.5* 23.5* 18.6* 21.1*  MCV 86.1 91.6 85.8 87.3 86.8  PLT 358 202 303 274 225     Cardiac Enzymes: No results for input(s): CKTOTAL, CKMB, CKMBINDEX, TROPONINI in the last 168 hours.  BNP: Invalid input(s): POCBNP  CBG: Recent Labs  Lab 07/23/21 1617 07/23/21 2007 07/23/21 2312 07/24/21 0400 07/24/21 0759  GLUCAP 166* 149* 187* 144* 83     Microbiology: Results for orders placed or performed during the hospital encounter of 06/27/21  Resp Panel by RT-PCR (Flu A&B, Covid) Nasopharyngeal Swab     Status: None   Collection Time: 06/27/21 12:32 AM   Specimen: Nasopharyngeal Swab; Nasopharyngeal(NP) swabs in vial transport medium  Result Value Ref Range Status   SARS Coronavirus 2 by RT PCR NEGATIVE NEGATIVE Final    Comment:  (NOTE) SARS-CoV-2 target nucleic acids are NOT DETECTED.  The SARS-CoV-2 RNA is generally detectable in upper respiratory specimens during the acute phase of infection. The lowest concentration of SARS-CoV-2 viral copies this assay can detect is 138 copies/mL. A negative result does not preclude SARS-Cov-2 infection and should not be used as the sole basis for treatment or other patient management decisions. A negative result may occur with  improper specimen collection/handling, submission of specimen other than nasopharyngeal swab, presence of viral mutation(s) within the areas targeted by this assay, and inadequate number of viral copies(<138 copies/mL). A negative result must be combined with clinical observations, patient history, and epidemiological information. The expected result is Negative.  Fact Sheet for Patients:  EntrepreneurPulse.com.au  Fact Sheet for Healthcare Providers:  IncredibleEmployment.be  This test is no t yet approved or cleared by the [place] FDA and  has been authorized for detection and/or diagnosis of SARS-CoV-2 by FDA under an Emergency Use Authorization (EUA). This EUA will remain  in effect (meaning this test can be used) for the duration of the COVID-19 declaration under Section 564(b)(1) of the Act, 21 U.S.C.section 360bbb-3(b)(1), unless the authorization is terminated  or revoked sooner.       Influenza A by PCR NEGATIVE NEGATIVE Final   Influenza B by PCR NEGATIVE NEGATIVE Final    Comment: (NOTE) The Xpert Xpress SARS-CoV-2/FLU/RSV plus assay is intended as an aid in the diagnosis of influenza from Nasopharyngeal swab specimens and should not be used as a sole basis for treatment. Nasal washings and aspirates are unacceptable for Xpert Xpress SARS-CoV-2/FLU/RSV testing.  Fact Sheet for Patients: EntrepreneurPulse.com.au  Fact Sheet for Healthcare  Providers: IncredibleEmployment.be  This test is not yet approved or cleared by the [place] FDA and has been authorized for detection and/or diagnosis of SARS-CoV-2 by FDA under an Emergency Use Authorization (EUA). This EUA will remain in effect (meaning this test can be used) for the duration of the COVID-19 declaration under Section 564(b)(1) of the Act, 21 U.S.C. section 360bbb-3(b)(1), unless the authorization is terminated or revoked.  Performed at [facility], [address] [place], [place] [address]   Blood culture (routine x 2)     Status: None   Collection Time: 06/27/21 12:43 AM   Specimen: BLOOD  Result Value Ref Range Status   Specimen Description BLOOD RIGHT ASSIST CONTROL  Final   Special Requests   Final    BOTTLES DRAWN AEROBIC AND ANAEROBIC Blood Culture results may not be optimal due to an inadequate volume of blood received in culture bottles   Culture   Final    NO GROWTH 5 DAYS Performed at [facility], [address], [place], [place] [address]  Report Status 07/02/2021 FINAL  Final  Blood culture (routine x 2)     Status: None   Collection Time: 06/27/21  1:45 AM   Specimen: BLOOD  Result Value Ref Range Status   Specimen Description BLOOD RIGHT ASSIST CONTROL  Final   Special Requests   Final    BOTTLES DRAWN AEROBIC AND ANAEROBIC Blood Culture adequate volume   Culture   Final    NO GROWTH 5 DAYS Performed at [facility], [address], [place], [place] [address]    Report Status 07/02/2021 FINAL  Final  Urine Culture     Status: Abnormal   Collection Time: 06/27/21  2:39 AM   Specimen: In/Out Cath Urine  Result Value Ref Range Status   Specimen Description   Final    IN/OUT CATH URINE Performed at [facility], [address], [place], [place] [address]    Special Requests   Final    NONE Performed at [facility], [place], [place] [address]     Culture 30,000 COLONIES/mL YEAST (A)  Final   Report Status 06/28/2021 FINAL  Final  MRSA Next Gen by PCR, Nasal     Status: None   Collection Time: 06/30/21  5:01 AM   Specimen: Nasal Mucosa; Nasal Swab  Result Value Ref Range Status   MRSA by PCR Next Gen NOT DETECTED NOT DETECTED Final    Comment: (NOTE) The GeneXpert MRSA Assay (FDA approved for NASAL specimens only), is one component of a comprehensive MRSA colonization surveillance program. It is not intended to diagnose MRSA infection nor to guide or monitor treatment for MRSA infections. Test performance is not FDA approved in patients less than 35 years old. Performed at [facility], [address] [place], [place] [address]   Urine Culture     Status: Abnormal   Collection Time: 07/05/21 10:36 AM   Specimen: PATH Other; Urine  Result Value Ref Range Status   Specimen Description   Final    URINE, RANDOM LEFT RENAL PELVIS URINE Performed at [facility], [address], [place], [place] [address]    Special Requests   Final    NONE Performed at [facility], [address] [place], [place] [address]    Culture 70,000 [place] (A)  Final   Report Status 07/12/2021 FINAL  Final  Urine Culture     Status: Abnormal   Collection Time: 07/05/21 10:48 AM   Specimen: PATH Other; Urine  Result Value Ref Range Status   Specimen Description   Final    URINE, RANDOM RIGHT KIDNEY URINE Performed at [facility], [address] [place], [place] [address]    Special Requests   Final    NONE Performed at [facility], [address], [place], [place] [address]    Culture (A)  Final    >=100,000 COLONIES/mL CANDIDA GLABRATA SEE SEPARATE REPORT Performed at [facility], [place] [address] [place], [place] [address]    Report Status 07/17/2021 FINAL  Final  Antifungal AST 9 Drug Panel     Status: None   Collection Time: 07/05/21 10:48 AM  Result  Value Ref Range Status   Organism ID, Yeast Candida glabrata  Corrected    Comment: (NOTE) Identification performed by account, not confirmed by this laboratory. CORRECTED ON 08/21 AT 1535: PREVIOUSLY REPORTED AS Preliminary report    Amphotericin B MIC 0.5 ug/mL  Final    Comment: (  NOTE) Breakpoints have been established for only some organism-drug combinations as indicated. This test was developed and its performance characteristics determined by [facility]. It has not been cleared or approved by the Food and Drug Administration.    Anidulafungin MIC Comment  Final    Comment: (NOTE) 0.03 ug/mL Susceptible Breakpoints have been established for only some organism-drug combinations as indicated. This test was developed and its performance characteristics determined by [facility]. It has not been cleared or approved by the Food and Drug Administration.    Caspofungin MIC Comment  Final    Comment: (NOTE) 0.06 ug/mL Susceptible Breakpoints have been established for only some organism-drug combinations as indicated. This test was developed and its performance characteristics determined by [facility]. It has not been cleared or approved by the Food and Drug Administration.    Micafungin MIC Comment  Final    Comment: (NOTE) 0.016 ug/mL Susceptible Breakpoints have been established for only some organism-drug combinations as indicated. This test was developed and its performance characteristics determined by [facility]. It has not been cleared or approved by the Food and Drug Administration.    Posaconazole MIC 0.25 ug/mL  Final    Comment: (NOTE) Breakpoints have been established for only some organism-drug combinations as indicated. This test was developed and its performance characteristics determined by [facility]. It has not been cleared or approved by the Food and Drug Administration.    Fluconazole Islt MIC 2.0 ug/mL  Final    Comment: (NOTE) Susceptible Dose  Dependent Breakpoints have been established for only some organism-drug combinations as indicated. This test was developed and its performance characteristics determined by [facility]. It has not been cleared or approved by the Food and Drug Administration.    Flucytosine MIC 0.06 ug/mL or less  Final    Comment: (NOTE) Breakpoints have been established for only some organism-drug combinations as indicated. This test was developed and its performance characteristics determined by [facility]. It has not been cleared or approved by the Food and Drug Administration.    Itraconazole MIC 0.12 ug/mL  Final    Comment: (NOTE) Breakpoints have been established for only some organism-drug combinations as indicated. This test was developed and its performance characteristics determined by [facility]. It has not been cleared or approved by the Food and Drug Administration.    Voriconazole MIC 0.06 ug/mL  Final    Comment: (NOTE) Breakpoints have been established for only some organism-drug combinations as indicated. This test was developed and its performance characteristics determined by [facility]. It has not been cleared or approved by the Food and Drug Administration. Performed At: [facility] [address] [place], [place] [id number] Rush Farmer MD DX:[id number]    Source CANDIDA GLABRATA SUSCEPTIBILITY URINE CULTURE  Final    Comment: Performed at [facility], [place] [address] [place], [place] [address]  CULTURE, BLOOD (ROUTINE X 2) w Reflex to ID Panel     Status: None   Collection Time: 07/15/21  7:57 PM   Specimen: BLOOD  Result Value Ref Range Status   Specimen Description BLOOD LEFT ASSIST CONTROL  Final   Special Requests   Final    BOTTLES DRAWN AEROBIC AND ANAEROBIC Blood Culture adequate volume   Culture   Final    NO GROWTH 5 DAYS Performed at [facility], [address] [place], [place] [address]    Report Status 07/20/2021 FINAL  Final   CULTURE, BLOOD (ROUTINE X 2) w Reflex to ID Panel     Status: None  Collection Time: 07/15/21  8:07 PM   Specimen: BLOOD  Result Value Ref Range Status   Specimen Description BLOOD LEFT FOREARM  Final   Special Requests   Final    BOTTLES DRAWN AEROBIC AND ANAEROBIC Blood Culture adequate volume   Culture   Final    NO GROWTH 5 DAYS Performed at [facility], [address] [place], [place] [address]    Report Status 07/20/2021 FINAL  Final  Body fluid culture w Gram Stain     Status: None (Preliminary result)   Collection Time: 07/21/21  1:23 PM   Specimen: Peritoneal Washings; Body Fluid  Result Value Ref Range Status   Specimen Description   Final    PERITONEAL Performed at [facility], [address] [place], [place] [address]    Special Requests   Final    NONE Performed at [facility], [address] [place], [place] [address]    Gram Stain   Final    MODERATE WBC PRESENT,BOTH PMN AND MONONUCLEAR NO ORGANISMS SEEN    Culture   Final    CULTURE REINCUBATED FOR BETTER GROWTH Performed at [facility], [place] [address], [place], [place] [address]    Report Status PENDING  Incomplete  Aerobic/Anaerobic Culture w Gram Stain (surgical/deep wound)     Status: None (Preliminary result)   Collection Time: 07/21/21  3:32 PM   Specimen: Abdomen  Result Value Ref Range Status   Specimen Description   Final    ABDOMEN Performed at [facility], [address], [place], [place] [address]    Special Requests   Final    NONE Performed at [facility], [address] [place], [place] [address]    Gram Stain   Final    MODERATE SQUAMOUS EPITHELIAL CELLS PRESENT WBC PRESENT, PREDOMINANTLY PMN NO ORGANISMS SEEN    Culture   Final    NO GROWTH 2 DAYS NO ANAEROBES ISOLATED; CULTURE IN PROGRESS FOR 5 DAYS Performed at [facility], [place] [address], [place], [place] [address]    Report Status PENDING  Incomplete     Coagulation Studies: No results for input(s): LABPROT, INR in the last 72 hours.    Urinalysis: No results for input(s): COLORURINE, LABSPEC, PHURINE, GLUCOSEU, HGBUR, BILIRUBINUR, KETONESUR, PROTEINUR, UROBILINOGEN, NITRITE, LEUKOCYTESUR in the last 72 hours.  Invalid input(s): APPERANCEUR      Imaging: No results found.   Medications:    sodium chloride Stopped (07/23/21 0603)   amphotericin B  Conventional (FUNGIZONE) IV Stopped (07/24/21 0204)   feeding supplement (NEPRO CARB STEADY) Stopped (07/23/21 0100)   piperacillin-tazobactam Stopped (07/24/21 0508)    sodium chloride   Intravenous Once   acetaminophen (TYLENOL) oral liquid 160 mg/5 mL  650 mg Per Tube Q24H   amLODipine  10 mg Per Tube Daily   chlorhexidine  15 mL Mouth Rinse BID   Chlorhexidine Gluconate Cloth  6 each Topical Q0600   cloNIDine  0.1 mg Per Tube Daily   dextrose  10 mL Intravenous Q24H   dextrose  10 mL Intravenous Q24H   epoetin (EPOGEN/PROCRIT) injection  10,000 Units Intravenous Q M,W,F-HD   famotidine  20 mg Per Tube QHS   feeding supplement (PROSource TF)  45 mL Per Tube BID   free water  200 mL Per Tube Q4H   heparin injection (subcutaneous)  5,000 Units Subcutaneous Q8H   insulin aspart  0-20 Units Subcutaneous Q4H   insulin aspart  6 Units Subcutaneous Q4H   insulin detemir  30 Units Subcutaneous BID   mouth rinse  15 mL Mouth Rinse q12n4p   metoprolol tartrate  100 mg Per Tube BID   multivitamin  1 tablet Per Tube QHS   nutrition supplement (JUVEN)  1 packet Per Tube BID BM   sodium chloride  500 mL Intravenous Q24H   sodium chloride  500 mL Intravenous Q24H   sodium chloride flush  5 mL Intracatheter Q8H     Assessment/ Plan:  Ms. LORRINE KILLILEA is a 75 y.o. white female with diabetes mellitus type II, hypertension, hyperlipidemia, nephrolithiasis, depression, gout who is admitted to [facility] on 06/27/2021 for Hyperkalemia [E87.5] Hyponatremia [E87.1] ARF (acute renal failure)  (HCC) [N17.9] Acute renal failure (ARF) (HCC) [N17.9] Acute respiratory failure with hypoxia ([place]) [J96.01] Demand ischemia of myocardium (HCC) [I24.8] Acute respiratory failure with hypoxemia (HCC) [J96.01] Acute renal failure superimposed on chronic kidney disease, unspecified CKD stage, unspecified acute renal failure type ([place]) [N17.9, N18.9] Sepsis, due to unspecified organism, unspecified whether acute organ dysfunction present [facility]) [A41.9]  Acute Kidney Injury on chronic kidney disease stage IV with baseline creatinine 1.97 and GFR of 25 on 12/22/19.  Acute kidney injury secondary to sepsis and obstructive uropathy. Chronic kidney disease is secondary to diabetic nephropathy.Nonoliguric urine output.  - Continue to hold losartan and metformin -Urine output is fair.  Electrolytes and volume status are acceptable.  No acute indication for dialysis at present.  However, in light of rising creatinine, will monitor closely on a daily basis.  Lab Results  Component Value Date   CREATININE 2.78 (H) 07/24/2021   CREATININE 2.55 (H) 07/23/2021   CREATININE 1.83 (H) 07/22/2021    Intake/Output Summary (Last 24 hours) at 07/24/2021 0855 Last data filed at 07/24/2021 0616 Gross per 24 hour  Intake 2940.98 ml  Output 2565 ml  Net 375.98 ml    2. Acute Respiratory failure secondary to community acquired pneumonia failing outpatient antibiotics. Febrile.  - Continue supplement oxygen.  - IV pip/tazo and fluconazole - 8/20 blood cultures with no growth    3. Anemia of chronic kidney disease Normocytic Lab Results  Component Value Date   HGB 7.2 (L) 07/24/2021   -We will change to EPO to subcu so that administration is consistent.  4.  Diabetes mellitus type II with chronic kidney disease insulin dependent. Most recent hemoglobin A1c is 8.3% on 06/12/21.  Metformin held    5.Bilateral Nephrolithiasis- with obstructive uropathy and left renal abscess  6. Hypokalemia: - Continue  potassium supplementation         LOS: 27 Myrla Malanowski 8/29/20228:55 AM

## 2021-07-24 NOTE — Consult Note (Signed)
[facility] [facility] Nurse Consult Note: Reason for Consult: Coccygeal wound has progressed from a Stage 2 PI to an Unstageable in the area of a full thickness tissue Wound type:Pressure Pressure Injury POA: Yes Measurement:Measurement of entire affected area is 7cm x 6.4cm. the Unstageable area measures 2cm x 2.5cm and the rest of the wound, located more distally is a Stage 3 (full thickness) wound. Wound bed: Non viable wound at apex of the triangular wound is black/gray in color. The Stage 3 are is red, moist Drainage (amount, consistency, odor) Serous to serosanguinous in a small amount Periwound: intact, moist Dressing procedure/placement/frequency: Patient is on a ICU bed with low air loss feature and is being turned and repositioned from side to side. Her heels are floated, but I will provide Prevalon pressure redistribution heel boots today. A silicone foam is in place, but I will add collagenase to the nonviable tissue once daily and saline moistened gauze to cover that area as well as the now Stage 3 pressure injury. The wound is to be covered with an ABD pad and secured with Medipore tape. PRN wound dressing changes are indicated for soiling or drainage strike through.  [place] nursing team will not follow, but will remain available to this patient, the nursing and medical teams.  Please re-consult if needed. Thanks, Maudie Flakes, MSN, RN, [place], Arther Abbott  Pager# [phone]

## 2021-07-24 NOTE — TOC Progression Note (Signed)
Transition of Care [facility]) - Progression Note    Patient Details  Name: Heather Lopez MRN: [id number] Date of Birth: 12-19-1945  Transition of Care [facility]) CM/SW [place], RN Phone Number: 07/24/2021, 1:02 PM  Clinical Narrative:  Raquel Sarna from [facility] called requesting discharge information, they have a bed and can arrange for discharge following dialysis. Spoke with Attending, patient with abdominal bleeding and will require evaluation first. Raquel Sarna notified of discharge barrier.     Expected Discharge Plan: [place] Barriers to Discharge: Continued Medical Work up  Expected Discharge Plan and Services Expected Discharge Plan: [place] In-house Referral: Clinical Social Work   Post Acute Care Choice: [place] Living arrangements for the past 2 months: Single Family Home                                       Social Determinants of Health (SDOH) Interventions    Readmission Risk Interventions No flowsheet data found.

## 2021-07-24 NOTE — Progress Notes (Signed)
Patient could not keep sat up on regular HFNC at 15L. Changed to [place] at this time at 90% and 50L.

## 2021-07-24 NOTE — Progress Notes (Signed)
Physical Therapy Treatment Patient Details Name: Heather Lopez MRN: [id number] DOB: October 08, 1946 Today's Date: 07/24/2021    History of Present Illness Pt is a [name] who presented to the ED on 06/27/21 with chief complaints of progressive shortness of breath, productive cough, loss of appetite, nausea, dry heaving fatigue, malaise and generalized body aches. Patient initially went to her PCP on 7/29 with complaints of rhinorrhea, harsh cough, loss of appetite, nausea, fatigue, malaise and generalized body aches x2 days, patient was diagnosed with pneumonia of which she received ceftriaxone 1 mg IM and sent home with Levaquin, prednisone and Tessalon. On 8/4 pt developed acute abdominal pain with CT showing viscus perforation. Pt urgently taken to the OR for ex lap. Pt is also s/p cystoscopy with B ureteral stent placement for L obstructed kidney stone on 8/10.    PT Comments    Pt is making gradual progress towards goals and needs max encouragement to participate. Able to participate in minimal there-ex. Requires +2 assist for rolling and repositioning. Co-treat performed with OT. Will continue to progress as able.   Follow Up Recommendations  SNF;Supervision/Assistance - 24 hour     Equipment Recommendations  None recommended by PT    Recommendations for Other Services       Precautions / Restrictions Precautions Precautions: Fall Precaution Comments: NG tube, abdominal incision & JP drains, foley catheter, fecal tube, L jugular tunneled hemodialysis catheter Restrictions Weight Bearing Restrictions: No    Mobility  Bed Mobility Overal bed mobility: Needs Assistance Bed Mobility: Rolling Rolling: Total assist;+2 for physical assistance         General bed mobility comments: able to roll 3 times towards L side. Total assist +2. Repositioned at end of session with pillows under extremities and bed in chair position    Transfers                 General transfer comment: not  appropriate at this time  Ambulation/Gait                 Stairs             Wheelchair Mobility    Modified Rankin (Stroke Patients Only)       Balance                                            Cognition Arousal/Alertness: Awake/alert Behavior During Therapy: Flat affect Overall Cognitive Status: Impaired/Different from baseline                                 General Comments: more alert and oriented this date. Hesitant about performing therapy this date      Exercises Other Exercises Other Exercises: supine ther-ex performed on B LE including minimal quad sets and heel slides. 10 reps. Min assist on L and max assist on R LE Other Exercises: Multiple bouts of rolling with total assist required    General Comments        Pertinent Vitals/Pain Pain Assessment: Faces Faces Pain Scale: Hurts a little bit Pain Location: R LE and hands during extension Pain Descriptors / Indicators: Discomfort Pain Intervention(s): Limited activity within patient's tolerance;Repositioned    Home Living  Prior Function            PT Goals (current goals can now be found in the care plan section) Acute Rehab PT Goals Patient Stated Goal: to go home PT Goal Formulation: With family Time For Goal Achievement: 07/29/21 Potential to Achieve Goals: Fair Progress towards PT goals: Progressing toward goals    Frequency    Min 2X/week      PT Plan Current plan remains appropriate    Co-evaluation PT/OT/SLP Co-Evaluation/Treatment: Yes Reason for Co-Treatment: Complexity of the patient's impairments (multi-system involvement);For patient/therapist safety;To address functional/ADL transfers PT goals addressed during session: Strengthening/ROM;Mobility/safety with mobility OT goals addressed during session: ADL's and self-care;Proper use of Adaptive equipment and DME      AM-PAC PT "6 Clicks" Mobility    Outcome Measure  Help needed turning from your back to your side while in a flat bed without using bedrails?: Total Help needed moving from lying on your back to sitting on the side of a flat bed without using bedrails?: Total Help needed moving to and from a bed to a chair (including a wheelchair)?: Total Help needed standing up from a chair using your arms (e.g., wheelchair or bedside chair)?: Total Help needed to walk in hospital room?: Total Help needed climbing 3-5 steps with a railing? : Total 6 Click Score: 6    End of Session Equipment Utilized During Treatment: Oxygen Activity Tolerance: Patient limited by fatigue Patient left: in bed;with call bell/phone within reach;with bed alarm set;with family/visitor present Nurse Communication: Mobility status PT Visit Diagnosis: Muscle weakness (generalized) (M62.81);Difficulty in walking, not elsewhere classified (R26.2)     Time: 5176-1607 PT Time Calculation (min) (ACUTE ONLY): 24 min  Charges:  $Therapeutic Exercise: 8-22 mins                     Heather Lopez, PT, DPT [phone]    Heather Lopez 07/24/2021, 12:56 PM

## 2021-07-24 NOTE — Progress Notes (Signed)
Occupational Therapy Treatment Patient Details Name: Heather Lopez MRN: [id number] DOB: 1946-06-15 Today's Date: 07/24/2021    History of present illness Pt is a [name] who presented to the ED on 06/27/21 with chief complaints of progressive shortness of breath, productive cough, loss of appetite, nausea, dry heaving fatigue, malaise and generalized body aches. Patient initially went to her PCP on 7/29 with complaints of rhinorrhea, harsh cough, loss of appetite, nausea, fatigue, malaise and generalized body aches x2 days, patient was diagnosed with pneumonia of which she received ceftriaxone 1 mg IM and sent home with Levaquin, prednisone and Tessalon. On 8/4 pt developed acute abdominal pain with CT showing viscus perforation. Pt urgently taken to the OR for ex lap. Pt is also s/p cystoscopy with B ureteral stent placement for L obstructed kidney stone on 8/10.   OT comments  Heather Lopez was seen for OT/PT co-treatment on this date. Upon arrival to room pt reclined in bed with husband at bedside, requires encouragement for session. Pt requires MOD A self-feeding ice chips using built up handle decreasing to MAX A as pt fatigues. MAX A for LBD at bed level. TOTAL A x2 for rolling L+R x3 for positioning, pt able to hold on to rail ~30 seconds in sidelying. SpO2 maintained high 80s - 90s t/o session. Pt making progress toward goals. Pt continues to benefit from skilled OT services to maximize return to PLOF and minimize risk of future falls, injury, caregiver burden, and readmission. Will continue to follow POC. Discharge recommendation remains appropriate.    Follow Up Recommendations  [facility] bed    Recommendations for Other Services      Precautions / Restrictions Precautions Precautions: Fall Precaution Comments: NG tube, abdominal incision & JP drains, foley catheter, fecal tube, L jugular tunneled hemodialysis catheter Restrictions Weight Bearing  Restrictions: No       Mobility Bed Mobility Overal bed mobility: Needs Assistance Bed Mobility: Rolling Rolling: Total assist;+2 for physical assistance         General bed mobility comments: able to roll 3 times towards L side. Total assist +2. Repositioned at end of session with pillows under extremities and bed in chair position    Transfers                 General transfer comment: not appropriate at this time        ADL either performed or assessed with clinical judgement   ADL Overall ADL's : Needs assistance/impaired                                       General ADL Comments: MOD A self-feeding ice chips using built up handle at bed in chair - assist for last ~20* to mouth and to navigate around NGT. MAX A for LBD at bed level. TOTAL A x2 for bed level t/fs      Cognition Arousal/Alertness: Awake/alert Behavior During Therapy: Flat affect Overall Cognitive Status: Impaired/Different from baseline                         Following Commands: Follows one step commands consistently;Follows one step commands with increased time     Problem Solving: Slow processing;Decreased initiation;Difficulty sequencing;Requires verbal cues;Requires tactile cues General Comments: responds to questions, hestitant for mobility        Exercises  Exercises: Other exercises Other Exercises Other Exercises: Pt and husband educated re: positioning to prevent contracture and HEP Other Exercises: LBD, rolling x2, self-feeding    Pertinent Vitals/ Pain       Pain Assessment: Faces Faces Pain Scale: Hurts a little bit Pain Location: R LE and hands during extension Pain Descriptors / Indicators: Discomfort Pain Intervention(s): Limited activity within patient's tolerance   Frequency  Min 1X/week        Progress Toward Goals  OT Goals(current goals can now be found in the care plan section)  Progress towards OT goals: Progressing toward  goals  Acute Rehab OT Goals Patient Stated Goal: to go home OT Goal Formulation: With family Time For Goal Achievement: 08/03/21 Potential to Achieve Goals: Fair ADL Goals Pt Will Transfer to Toilet: with max assist;with +2 assist Pt/caregiver will Perform Home Exercise Program: Both right and left upper extremity;With written HEP provided Additional ADL Goal #1: Pt will respond to 2/5 1 step commands c MOD cues  Plan Discharge plan remains appropriate;Frequency remains appropriate    Co-evaluation    PT/OT/SLP Co-Evaluation/Treatment: Yes Reason for Co-Treatment: Complexity of the patient's impairments (multi-system involvement);For patient/therapist safety;To address functional/ADL transfers PT goals addressed during session: Mobility/safety with mobility OT goals addressed during session: ADL's and self-care      AM-PAC OT "6 Clicks" Daily Activity     Outcome Measure   Help from another person eating meals?: A Lot Help from another person taking care of personal grooming?: A Lot Help from another person toileting, which includes using toliet, bedpan, or urinal?: Total Help from another person bathing (including washing, rinsing, drying)?: Total Help from another person to put on and taking off regular upper body clothing?: A Lot Help from another person to put on and taking off regular lower body clothing?: Total 6 Click Score: 9    End of Session    OT Visit Diagnosis: Muscle weakness (generalized) (M62.81)   Activity Tolerance Patient tolerated treatment well   Patient Left in bed;with call bell/phone within reach;with family/visitor present       Time: 0355-9741 OT Time Calculation (min): 25 min  Charges: OT General Charges $OT Visit: 1 Visit OT Treatments $Self Care/Home Management : 8-22 mins  Dessie Coma, M.S. OTR/L  07/24/21, 3:38 PM  ascom [phone]

## 2021-07-24 NOTE — Progress Notes (Addendum)
PROGRESS NOTE    Heather Lopez  YQI:[id number] DOB: 07/31/1946 DOA: 06/27/2021 PCP: [name], PA-C    Brief Narrative:  Heather Lopez is a 76 y.o. female with history of hypertension, diabetes mellitus, hyperlipidemia has been feeling weak for the last 3 to 4 days.  Patient had a acute renal failure with creatinine of 9.3, potassium 7.5, acute metabolic acidosis with pH 7.15, procalcitonin 6.83.  Patient was diagnosed was septic shock, acute respiratory failure. On night of 8/4, patient developed acute abdominal pain, CT scan showed acute abdomen secondary to perforated duodenum.  Patient was placed on mechanical ventilation after duodenum repair. Patient had a bilateral hydronephrosis, ureteral stents was placed on 8/10. Patient urine culture was positive for Candida glabrata.  ID consult was obtained, patient currently on high-dose of Diflucan. 8/26. Due to persistent fever, CT scan of abdomen/pelvis was repeated on 8/26, aspiration pararenal fluids was obtained, removed 5 mL of pus.  ID changed antibiotic to Amphotericin in addition to Zosyn.   Assessment & Plan:   Active Problems:   Uncontrolled type 2 diabetes mellitus with insulin therapy ([place])   Hyperlipidemia associated with type 2 diabetes mellitus ([place])   Type 2 diabetes mellitus with stage 4 chronic kidney disease, with long-term current use of insulin (HCC)   Acute respiratory failure with hypoxia (HCC)   Severe sepsis with septic shock (HCC)   Acute lower UTI   Acute kidney injury superimposed on chronic kidney disease (HCC)   Pneumoperitoneum   Pressure injury of skin   Atrial flutter with rapid ventricular response (HCC)   Endotracheally intubated   Bilateral nephrolithiasis   Anemia of chronic disease   Demand ischemia of myocardium (HCC)   Lethargy   Duodenal ulcer perforation ([place])   Renal abscess   Essential hypertension   Hypokalemia  Acute metabolic encephalopathy. Sepsis with septic shock secondary to  obstructive uropathy, kidney stone.  Status post bilateral ureteral stents. Candida urinary tract infection Perforated duodenum. S/p repair. Perirenal abscess. Condition finally improved.  Continue antibiotics with Amphotericin and Zosyn. No additional fever. Drain from pararenal abscess still negative in culture.  Acute hypoxemic respiratory failure secondary to septic shock. Morbid obesity and obesity hypoventilation syndrome  Patient is on scheduled BiPAP at nighttime.  Currently stable.  Acute kidney injury on chronic kidney disease stage IV. Hypokalemia. Iron deficiency anemia Patient B12 is borderline, homocystine level normal.  No B12 deficiency. Patient received IV iron, 2 units PRBC yesterday.  Hemoglobin today 7.2. Patient has a rectal tube with loose stool, it looks black.  Sent occult blood.  Also started IV Protonix.  If stool is heme positive, will consult GI. Spoke with nephrology, patient is not scheduled for dialysis, I will replete potassium with 40 mEq IV potassium.  Uncontrolled type 2 diabetes with hyperglycemia  Continue current regimen.  1440. Patient developed worsening hypoxemia, currently on 90% oxygen with heated high flow.  Reviewed stat chest x-ray, more right-sided vascular congestion.  Aspiration versus acute volume overload.  Notified Dr. Candiss Norse, patient may need dialysis.  I will also give 80 mg IV Lasix every 12 hours, patient is making urine.   DVT prophylaxis: Heparin Code Status: DNR Family Communication: Husband updated.  We will meet with daughter later today. Disposition Plan:    Status is: Inpatient  Remains inpatient appropriate because:IV treatments appropriate due to intensity of illness or inability to take PO and Inpatient level of care appropriate due to severity of illness  Dispo: The patient is from:  Home              Anticipated d/c is to: LTAC              Patient currently is not medically stable to d/c.   Difficult to place  patient No   Patient has been accepted into LTAC, but currently not stable to transfer.     I/O last 3 completed shifts: In: 4738.6 [I.V.:162.4; Blood:305; NG/GT:840; IV Piggyback:3431.2] Out: 3270 [LOVFI:4332; Drains:25; RJJOA:4166] No intake/output data recorded.     Consultants:  ID  Procedures:   Antimicrobials: Zosyn and Amphotericin.  Subjective: Spoke with the nurse, patient did well last night.  She was on BiPAP which was scheduled every night.  She has some short of breath with exertion. She does not have any abdominal pain or nausea vomiting, she still has some loose stools in the rectal tube.  It looks black. No chest pain or palpitation. No fever or chills. She is still making large amount of urine, no dysuria.  No hematuria.   Objective: Vitals:   07/24/21 0500 07/24/21 0600 07/24/21 0800 07/24/21 0802  BP: (!) 152/54 (!) 158/60 (!) 147/59   Pulse: 85 96 97   Resp: (!) 22 (!) 22 (!) 26   Temp:   99.3 F (37.4 C)   TempSrc:      SpO2: 91% 91% 90% (!) 88%  Weight:      Height:        Intake/Output Summary (Last 24 hours) at 07/24/2021 1151 Last data filed at 07/24/2021 0616 Gross per 24 hour  Intake 2940.98 ml  Output 2565 ml  Net 375.98 ml   Filed Weights   07/17/21 0500 07/18/21 0500 07/22/21 0500  Weight: 125.5 kg 125.9 kg 125.9 kg    Examination:  General exam: Appears calm and comfortable, morbid obese. Respiratory system: Clear to auscultation. Respiratory effort normal. Cardiovascular system: S1 & S2 heard, RRR. No JVD, murmurs, rubs, gallops or clicks. No pedal edema. Gastrointestinal system: Abdomen is nondistended, soft and nontender. No organomegaly or masses felt. Normal bowel sounds heard. Central nervous system: Alert and oriented x2.  No focal neurological deficits. Extremities: Symmetric 5 x 5 power. Skin: No rashes, lesions or ulcers Psychiatry: Mood & affect appropriate.     Data Reviewed: I have personally reviewed  following labs and imaging studies  CBC: Recent Labs  Lab 07/19/21 0449 07/20/21 0601 07/21/21 0528 07/23/21 0634 07/24/21 0629  WBC 7.8 10.7* 15.4* 15.2* 15.5*  NEUTROABS  --   --  13.2* 11.3* 12.4*  HGB 7.8* 8.6* 7.4* 5.8* 7.2*  HCT 25.3* 28.5* 23.5* 18.6* 21.1*  MCV 86.1 91.6 85.8 87.3 86.8  PLT 358 202 303 274 063   Basic Metabolic Panel: Recent Labs  Lab 07/18/21 0511 07/19/21 0449 07/20/21 0601 07/21/21 0528 07/22/21 0516 07/22/21 1739 07/23/21 0634 07/24/21 0629  NA 144 141 136 137 135  --  138 135  K 3.4* 3.2* 4.1 2.9* 2.8* 3.0* 3.1* 3.1*  CL 98 100 99 99 97*  --  100 101  CO2 32 30 25 28 28   --  25 23  GLUCOSE 232* 251* 190* 223* 154*  --  178* 96  BUN 71* 87* 55* 60* 41*  --  60* 65*  CREATININE 1.82* 2.08* 2.04* 2.35* 1.83*  --  2.55* 2.78*  CALCIUM 9.0 9.1 8.9 8.9 8.5*  --  8.9 8.6*  MG 1.9 2.2 1.9 1.9 1.8 2.0 2.2 1.9  PHOS 2.7 3.6 2.7  --   --   --   --   --  GFR: Estimated Creatinine Clearance: 22.5 mL/min (A) (by C-G formula based on SCr of 2.78 mg/dL (H)). Liver Function Tests: Recent Labs  Lab 07/21/21 0528  AST 33  ALT 53*  ALKPHOS 101  BILITOT 0.5  PROT 5.8*  ALBUMIN 1.6*   No results for input(s): LIPASE, AMYLASE in the last 168 hours. No results for input(s): AMMONIA in the last 168 hours. Coagulation Profile: No results for input(s): INR, PROTIME in the last 168 hours. Cardiac Enzymes: No results for input(s): CKTOTAL, CKMB, CKMBINDEX, TROPONINI in the last 168 hours. BNP (last 3 results) No results for input(s): PROBNP in the last 8760 hours. HbA1C: No results for input(s): HGBA1C in the last 72 hours. CBG: Recent Labs  Lab 07/23/21 1617 07/23/21 2007 07/23/21 2312 07/24/21 0400 07/24/21 0759  GLUCAP 166* 149* 187* 144* 83   Lipid Profile: No results for input(s): CHOL, HDL, LDLCALC, TRIG, CHOLHDL, LDLDIRECT in the last 72 hours. Thyroid Function Tests: No results for input(s): TSH, T4TOTAL, FREET4, T3FREE, THYROIDAB  in the last 72 hours. Anemia Panel: No results for input(s): VITAMINB12, FOLATE, FERRITIN, TIBC, IRON, RETICCTPCT in the last 72 hours. Sepsis Labs: Recent Labs  Lab 07/18/21 0511  PROCALCITON 1.88    Recent Results (from the past 240 hour(s))  CULTURE, BLOOD (ROUTINE X 2) w Reflex to ID Panel     Status: None   Collection Time: 07/15/21  7:57 PM   Specimen: BLOOD  Result Value Ref Range Status   Specimen Description BLOOD LEFT ASSIST CONTROL  Final   Special Requests   Final    BOTTLES DRAWN AEROBIC AND ANAEROBIC Blood Culture adequate volume   Culture   Final    NO GROWTH 5 DAYS Performed at [facility], [address] [place], [place] [address]    Report Status 07/20/2021 FINAL  Final  CULTURE, BLOOD (ROUTINE X 2) w Reflex to ID Panel     Status: None   Collection Time: 07/15/21  8:07 PM   Specimen: BLOOD  Result Value Ref Range Status   Specimen Description BLOOD LEFT FOREARM  Final   Special Requests   Final    BOTTLES DRAWN AEROBIC AND ANAEROBIC Blood Culture adequate volume   Culture   Final    NO GROWTH 5 DAYS Performed at [facility], [address] [place], [place] [address]    Report Status 07/20/2021 FINAL  Final  Body fluid culture w Gram Stain     Status: None (Preliminary result)   Collection Time: 07/21/21  1:23 PM   Specimen: Peritoneal Washings; Body Fluid  Result Value Ref Range Status   Specimen Description   Final    PERITONEAL Performed at [facility], [address] [place], [place] [address]    Special Requests   Final    NONE Performed at [facility], [address] [place], [place] [address]    Gram Stain   Final    MODERATE WBC PRESENT,BOTH PMN AND MONONUCLEAR NO ORGANISMS SEEN    Culture   Final    CULTURE REINCUBATED FOR BETTER GROWTH Performed at [facility], [place] [address] [place], [place] [address]    Report Status PENDING  Incomplete  Aerobic/Anaerobic Culture w Gram Stain  (surgical/deep wound)     Status: None (Preliminary result)   Collection Time: 07/21/21  3:32 PM   Specimen: Abdomen  Result Value Ref Range Status   Specimen Description   Final    ABDOMEN Performed at [facility], [address], [place], [place] [address]    Special Requests   Final    NONE Performed at [facility], [place], [place] [address]    Gram Stain   Final    MODERATE SQUAMOUS EPITHELIAL CELLS PRESENT WBC PRESENT, PREDOMINANTLY PMN NO ORGANISMS SEEN    Culture   Final    CULTURE REINCUBATED FOR BETTER GROWTH Performed at [facility], [place] [address] [place], [place] [address]    Report Status PENDING  Incomplete         Radiology Studies: No results found.      Scheduled Meds:  sodium chloride   Intravenous Once   acetaminophen (TYLENOL) oral liquid 160 mg/5 mL  650 mg Per Tube Q24H   amLODipine  10 mg Per Tube Daily   chlorhexidine  15 mL Mouth Rinse BID   Chlorhexidine Gluconate Cloth  6 each Topical Q0600   cloNIDine  0.1 mg Per Tube Daily   collagenase   Topical Daily   dextrose  10 mL Intravenous Q24H   dextrose  10 mL Intravenous Q24H   [START ON 07/28/2021] epoetin (EPOGEN/PROCRIT) injection  20,000 Units Subcutaneous Weekly   famotidine  20 mg Per Tube QHS   feeding supplement (PROSource TF)  45 mL Per Tube BID   free water  200 mL Per Tube Q4H   heparin injection (subcutaneous)  5,000 Units Subcutaneous Q8H   insulin aspart  0-20 Units Subcutaneous Q4H   insulin aspart  6 Units Subcutaneous Q4H   insulin detemir  30 Units Subcutaneous BID   mouth rinse  15 mL Mouth Rinse q12n4p   metoprolol tartrate  100 mg Per Tube BID   multivitamin  1 tablet Per Tube QHS   nutrition supplement (JUVEN)  1 packet Per Tube BID BM   sodium chloride  500 mL Intravenous Q24H   sodium chloride  500 mL Intravenous Q24H   sodium chloride flush  5 mL Intracatheter Q8H   Continuous Infusions:  sodium chloride Stopped  (07/23/21 0603)   amphotericin B  Conventional (FUNGIZONE) IV Stopped (07/24/21 0204)   feeding supplement (NEPRO CARB STEADY) Stopped (07/23/21 0100)     LOS: 27 days    Time spent: 32 minutes    Sharen Hones, MD [facility]   To contact the attending provider between 7A-7P or the covering provider during after hours 7P-7A, please log into the web site [email or web address] and access using universal [place] password for that web site. If you do not have the password, please call the hospital operator.  07/24/2021, 11:52 AM

## 2021-07-24 NOTE — Progress Notes (Addendum)
Nutrition Follow-up  DOCUMENTATION CODES:  Obesity unspecified  INTERVENTION:  Adjust to the following TF regimen: Glucerna 1.5 at 70m/hr Free water flushes 125 ml q4 hours Regimen provides 2160 kcal/day, 119g/day protein and 1843 ml/day free water (TF+flush) Juven Fruit Punch BID, each serving provides 95kcal and 2.5g of protein (amino acids glutamine and arginine) Rena-vit daily via tube  Recommend consideration for PEG tube placement for long-term route of nutrition. NGT has been in place x 3 weeks  NUTRITION DIAGNOSIS:  Increased nutrient needs related to acute illness, post-op healing (sepsis, ARF on HD) as evidenced by estimated needs.  GOAL:  Provide needs based on ASPEN/SCCM guidelines -met with tube feeds   MONITOR:  Vent status, Labs, Weight trends, TF tolerance, Skin, I & O's  ASSESSMENT:  75y.o. year old female with PMH of diabetes, CKD III, nephrolithiasis with chronic right hydroureteronephrosis, HLD, depression/anxiety, IBS and gout who is admitted with PNA, ARF and perforated duodenal ulcer now s/p exploratory laparotomy and primary repair of perforated duodenal ulcer with creation of omental flap 8/5.  Pt remains in ICU at this time, but mentation somewhat improved in the last week. Pt had a persistent fever and new imaging showed left anterior perirenal fluid collection that may represent abscess. Drain placed by IR and pus was aspirated and sent to lab.  HD has been held at this time as pt's renal function improved and UOP increased. Nephrology monitoring daily for need for HD. Undergoing workup for dc to LTACH.  Noted that feeds placed on hold last night, RN states they were resumed this AM, help last night for BiPAP. Current NGT has been in place for 3 weeks. Will need to be replaced within the next week, recommended PEG placement to MD as SLP has continued to recommended NPO (per last assessment note 8/26)  As K has been low and renal function improved, will  adjust TF formula and discuss with RN/MD as several more runs of K are ordered for the day.   Intake/Output Summary (Last 24 hours) at 07/24/2021 1417 Last data filed at 07/24/2021 0616 Gross per 24 hour  Intake 2940.98 ml  Output 2565 ml  Net 375.98 ml  Net IO Since Admission: -8,561.74 mL [07/24/21 1417]  Nutritionally Relevant Medications: Scheduled Meds:  famotidine  20 mg Per Tube QHS   feeding supplement (PROSource TF)  45 mL Per Tube BID   free water  200 mL Per Tube Q4H   insulin aspart  0-20 Units Subcutaneous Q4H   insulin aspart  6 Units Subcutaneous Q4H   insulin detemir  30 Units Subcutaneous BID   multivitamin  1 tablet Per Tube QHS   nutrition supplement (JUVEN)  1 packet Per Tube BID BM   Continuous Infusions:  sodium chloride Stopped (07/23/21 0603)   amphotericin B  Conventional (FUNGIZONE) IV Stopped (07/24/21 0204)   feeding supplement (NEPRO CARB STEADY) Stopped (07/23/21 0100)   piperacillin-tazobactam Stopped (07/24/21 0508)   PRN Meds: diphenhydrAMINE, ondansetron   Labs Reviewed: K 3.1 BUN 65, Creatinine 2.78 SBG ranges from 83-187 mg/dL over the last 24 hours  Diet Order:   Diet Order     None      EDUCATION NEEDS:  Not appropriate for education at this time  Skin:  Skin Assessment: Reviewed RN Assessment (Stage 2 sacrum, incision abdomen, MASD to lower breast), new wound per wound/ostomy RN (Unstagable sacral wound)  Last BM:  8/29, 2068moutput recorded fecal management system in place  Height:  Ht  Readings from Last 1 Encounters:  07/11/21 5' 2.01" (1.575 m)   Weight:  Wt Readings from Last 1 Encounters:  07/22/21 125.9 kg   Ideal Body Weight:  50 kg  BMI:  Body mass index is 50.75 kg/m.  Estimated Nutritional Needs:  Kcal:  2200-2400 kcal/d Protein:  110-120 g/d Fluid:  1.8-2 L/d   Ranell Patrick, RD, LDN Clinical Dietitian Pager on [place]

## 2021-07-24 NOTE — Progress Notes (Signed)
Patient refusing CHG at this time.

## 2021-07-24 NOTE — Consult Note (Signed)
Consultation  Referring Provider:  Roosevelt Locks    Admit date: 8/2 Consult date: 8/29        Reason for Consultation:   Melena/PEG           HPI:   Heather Lopez is a 75 y.o. lady with complicated hospital course secondary to perforated duodenum s/p surgical repair, AKI requiring dilaysis, candida glabrata infection on kidneys now requiring Amphotericin on top of her previous comorbidities including obesity, hypertension, and DM II. I was consulted for concern for melena and potential PEG tube placement. On my evaluation patient was very alert and per nursing this is the most alert she has been in 5 days. She denied any significant symptoms but she is on significant hi flow settings.   Past Medical History:  Diagnosis Date   Anxiety    Cataract    Depression    Diabetes mellitus without complication ([place])    Gout    History of kidney stones    Hyperlipidemia    Hypertension    Vertigo     Past Surgical History:  Procedure Laterality Date   CATARACT EXTRACTION, BILATERAL Bilateral 2019   COLONOSCOPY WITH PROPOFOL N/A 08/19/2015   Procedure: COLONOSCOPY WITH PROPOFOL;  Surgeon: Lucilla Lame, MD;  Location: [place];  Service: Endoscopy;  Laterality: N/A;  diabetic - insulin   CYSTOSCOPY W/ RETROGRADES Bilateral 12/22/2019   Procedure: CYSTOSCOPY WITH RETROGRADE PYELOGRAM;  Surgeon: Abbie Sons, MD;  Location: [facility];  Service: Urology;  Laterality: Bilateral;   CYSTOSCOPY W/ URETERAL STENT PLACEMENT Bilateral 07/05/2021   Procedure: CYSTOSCOPY WITH RETROGRADE PYELOGRAM/BILATERAL URETERAL STENT PLACEMENT;  Surgeon: Abbie Sons, MD;  Location: [facility];  Service: Urology;  Laterality: Bilateral;   CYSTOSCOPY WITH BIOPSY N/A 12/22/2019   Procedure: CYSTOSCOPY WITH bladder BIOPSY;  Surgeon: Abbie Sons, MD;  Location: [facility];  Service: Urology;  Laterality: N/A;   CYSTOSCOPY WITH STENT PLACEMENT Right 12/22/2019   Procedure: CYSTOSCOPY WITH STENT PLACEMENT;  Surgeon:  Abbie Sons, MD;  Location: [facility];  Service: Urology;  Laterality: Right;   CYSTOSCOPY WITH URETEROSCOPY Bilateral 12/22/2019   Procedure: CYSTOSCOPY WITH URETEROSCOPY;  Surgeon: Abbie Sons, MD;  Location: [facility];  Service: Urology;  Laterality: Bilateral;   CYSTOSCOPY/URETEROSCOPY/HOLMIUM LASER/STENT PLACEMENT Right 12/22/2019   Procedure: CYSTOSCOPY/URETEROSCOPY/HOLMIUM LASER/STENT PLACEMENT;  Surgeon: Abbie Sons, MD;  Location: [facility];  Service: Urology;  Laterality: Right;   DIALYSIS/PERMA CATHETER INSERTION N/A 06/29/2021   Procedure: DIALYSIS/PERMA CATHETER INSERTION;  Surgeon: Algernon Huxley, MD;  Location: [facility];  Service: Cardiovascular;  Laterality: N/A;   DIALYSIS/PERMA CATHETER INSERTION N/A 07/13/2021   Procedure: DIALYSIS/PERMA CATHETER INSERTION;  Surgeon: Algernon Huxley, MD;  Location: [facility];  Service: Cardiovascular;  Laterality: N/A;   EYE SURGERY     LAPAROTOMY N/A 06/29/2021   Procedure: EXPLORATORY LAPAROTOMY WITH REPAIR OF DUODENAL PERFORATION;  Surgeon: [name], MD;  Location: [facility];  Service: General;  Laterality: N/A;   POLYPECTOMY  08/19/2015   Procedure: POLYPECTOMY;  Surgeon: Lucilla Lame, MD;  Location: [place];  Service: Endoscopy;;   TUBAL LIGATION  1992    Family History  Problem Relation Age of Onset   Diabetes Father    Cancer Father        lung cancer   Diabetes Brother    Stroke Mother     Social History   Tobacco Use   Smoking status: Former    Packs/day: 0.75  Years: 6.00    Pack years: 4.50    Types: Cigarettes    Quit date: 72    Years since quitting: 49.6   Smokeless tobacco: Never   Tobacco comments:    quit 1973  Vaping Use   Vaping Use: Never used  Substance Use Topics   Alcohol use: No    Alcohol/week: 0.0 standard drinks   Drug use: No    Prior to Admission medications   Medication Sig Start Date End Date Taking? Authorizing Provider  amLODipine (NORVASC) 10  MG tablet Take 1 tablet (10 mg total) by mouth daily. 04/15/18  Yes Mikey College, NP  aspirin EC 81 MG tablet Take 81 mg by mouth daily.   Yes [provider]  atorvastatin (LIPITOR) 20 MG tablet Take 1 tablet (20 mg total) by mouth daily. 04/15/18  Yes Mikey College, NP  benzonatate (TESSALON) 200 MG capsule Take 200 mg by mouth 3 (three) times daily as needed. 06/23/21  Yes [provider]  buPROPion (WELLBUTRIN XL) 150 MG 24 hr tablet Take 1 tablet (150 mg total) by mouth daily. 05/26/18  Yes Mikey College, NP  insulin aspart (NOVOLOG FLEXPEN) 100 UNIT/ML FlexPen Use meal coverage 6 units with every meal plus correction scale three times daily up to total of 35 units per day. 04/15/18  Yes Mikey College, NP  Insulin Pen Needle 32G X 4 MM MISC Inject insulin pens up to 5 times daily 04/15/18  Yes Mikey College, NP  Lancets (FREESTYLE) lancets USE AS INSTRUCTED 06/16/19  Yes Karamalegos, Alexander J, DO  LEVEMIR FLEXTOUCH 100 UNIT/ML Pen INJECT 50 UNITS IN THE MORNING AND 44 UNITS IN THE EVENING Patient taking differently: Inject 52 Units into the skin 2 (two) times daily. 06/30/18  Yes Mikey College, NP  levofloxacin (LEVAQUIN) 500 MG tablet Take 500 mg by mouth daily. 06/23/21  Yes [provider]  losartan (COZAAR) 50 MG tablet Take 1 tablet (50 mg total) by mouth daily. 04/15/18  Yes Mikey College, NP  metFORMIN (GLUCOPHAGE) 1000 MG tablet Take 1 tablet (1,000 mg total) by mouth 2 (two) times daily. 04/15/18  Yes Mikey College, NP  metoprolol tartrate (LOPRESSOR) 25 MG tablet Take 1 tablet (25 mg total) by mouth 2 (two) times daily. 05/26/18  Yes Mikey College, NP  Multiple Vitamin (MULTI-VITAMIN) tablet Take 1 tablet by mouth daily.    Yes [provider]  ondansetron (ZOFRAN-ODT) 4 MG disintegrating tablet Take 4 mg by mouth every 8 (eight) hours as needed. 06/23/21  Yes [provider]   predniSONE (DELTASONE) 20 MG tablet Take 20 mg by mouth 2 (two) times daily. 06/23/21  Yes [provider]  TRULICITY 2.77 OE/4.2PN SOPN SMARTSIG:0.5 Milliliter(s) SUB-Q Once a Week 06/19/21  Yes [provider]    Current Facility-Administered Medications  Medication Dose Route Frequency Provider Last Rate Last Admin   0.9 %  sodium chloride infusion (Manually program via Guardrails IV Fluids)   Intravenous Once Sharen Hones, MD       0.9 %  sodium chloride infusion   Intravenous PRN Sharen Hones, MD   Stopped at 07/23/21 0603   acetaminophen (TYLENOL) 160 MG/5ML solution 650 mg  650 mg Per Tube Q6H PRN Rust-Chester, Huel Cote, NP   650 mg at 07/20/21 1934   acetaminophen (TYLENOL) 160 MG/5ML solution 650 mg  650 mg Per Tube Q24H Berton Mount, RPH   650 mg at 07/23/21 2133  amLODipine (NORVASC) tablet 10 mg  10 mg Per Tube Daily Algernon Huxley, MD   10 mg at 07/24/21 1053   amphotericin B (FUNGIZONE) 40 mg in dextrose 5 % 500 mL IVPB  0.3 mg/kg Intravenous Q24H Tsosie Billing, MD   Stopped at 07/24/21 0204   chlorhexidine (PERIDEX) 0.12 % solution 15 mL  15 mL Mouth Rinse BID Bennie Pierini, MD   15 mL at 07/24/21 1051   Chlorhexidine Gluconate Cloth 2 % PADS 6 each  6 each Topical Q0600 Algernon Huxley, MD   6 each at 07/24/21 1238   cloNIDine (CATAPRES) tablet 0.1 mg  0.1 mg Per Tube Daily Loletha Grayer, MD   0.1 mg at 07/24/21 1053   collagenase (SANTYL) ointment   Topical Daily Sharen Hones, MD   Given at 07/24/21 1200   dextrose 5 % 10 mL  10 mL Intravenous Q24H Tsosie Billing, MD   10 mL at 07/23/21 2200   dextrose 5 % 10 mL  10 mL Intravenous Q24H Tsosie Billing, MD   10 mL at 07/24/21 0208   diphenhydrAMINE (BENADRYL) injection 25 mg  25 mg Intravenous Daily PRN Tsosie Billing, MD       Or   diphenhydrAMINE (BENADRYL) 12.5 MG/5ML elixir 25 mg  25 mg Per Tube Daily PRN Tsosie Billing, MD       Derrill Memo ON 07/28/2021] epoetin  alfa (EPOGEN) injection 20,000 Units  20,000 Units Subcutaneous Weekly Singh, Harmeet, MD       feeding supplement (GLUCERNA 1.5 CAL) liquid 1,000 mL  1,000 mL Per Tube Continuous Sharen Hones, MD       free water 125 mL  125 mL Per Tube Q4H Sharen Hones, MD       furosemide (LASIX) injection 80 mg  80 mg Intravenous Q12H Sharen Hones, MD       heparin injection 1,000 Units  1,000 Units Dialysis PRN Algernon Huxley, MD   2,800 Units at 07/19/21 1210   heparin injection 5,000 Units  5,000 Units Subcutaneous Q8H Algernon Huxley, MD   5,000 Units at 07/24/21 1441   hydrALAZINE (APRESOLINE) injection 10 mg  10 mg Intravenous Q2H PRN Algernon Huxley, MD   10 mg at 07/14/21 2233   insulin aspart (novoLOG) injection 0-20 Units  0-20 Units Subcutaneous Q4H Algernon Huxley, MD   3 Units at 07/24/21 1245   insulin aspart (novoLOG) injection 6 Units  6 Units Subcutaneous Q4H Rust-Chester, Britton L, NP   6 Units at 07/24/21 1246   insulin detemir (LEVEMIR) injection 30 Units  30 Units Subcutaneous BID Sharen Hones, MD   30 Units at 07/24/21 1052   lidocaine (PF) (XYLOCAINE) 1 % injection 5 mL  5 mL Intradermal PRN Algernon Huxley, MD       lidocaine-prilocaine (EMLA) cream 1 application  1 application Topical PRN Algernon Huxley, MD       MEDLINE mouth rinse  15 mL Mouth Rinse q12n4p Bennie Pierini, MD   15 mL at 07/24/21 1248   meperidine (DEMEROL) injection 25 mg  25 mg Intravenous Q15 min PRN Tsosie Billing, MD       metoprolol tartrate (LOPRESSOR) tablet 100 mg  100 mg Per Tube BID Sharen Hones, MD   100 mg at 07/24/21 1053   multivitamin (RENA-VIT) tablet 1 tablet  1 tablet Per Tube QHS Algernon Huxley, MD   1 tablet at 07/23/21 2134   nutrition supplement (JUVEN) (JUVEN) powder packet 1  packet  1 packet Per Tube BID BM Algernon Huxley, MD   1 packet at 07/24/21 1459   ondansetron (ZOFRAN) injection 4 mg  4 mg Intravenous Q6H PRN Algernon Huxley, MD   4 mg at 07/23/21 0745   oxyCODONE (ROXICODONE) 5 MG/5ML solution 5  mg  5 mg Per Tube Q6H PRN Loletha Grayer, MD   5 mg at 07/24/21 1234   pantoprazole (PROTONIX) injection 40 mg  40 mg Intravenous Q12H Sharen Hones, MD   40 mg at 07/24/21 1245   pentafluoroprop-tetrafluoroeth (GEBAUERS) aerosol 1 application  1 application Topical PRN Dew, Erskine Squibb, MD       potassium chloride 10 mEq in 100 mL IVPB  10 mEq Intravenous Q1 Hr x 4 Sharen Hones, MD 100 mL/hr at 07/24/21 1613 10 mEq at 07/24/21 1613   sodium chloride 0.9 % bolus 500 mL  500 mL Intravenous Q24H Ravishankar, Joellyn Quails, MD   500 mL at 07/23/21 2057   sodium chloride 0.9 % bolus 500 mL  500 mL Intravenous Q24H Tsosie Billing, MD   500 mL at 07/24/21 0209   sodium chloride flush (NS) 0.9 % injection 5 mL  5 mL Intracatheter Q8H El-Abd, Joesph Fillers, MD   5 mL at 07/24/21 1610    Allergies as of 06/27/2021 - Review Complete 06/27/2021  Allergen Reaction Noted   Contrast media  [iodinated diagnostic agents] Anaphylaxis 12/29/2018   Iodine Swelling 07/12/2015     Review of Systems:    All systems reviewed and negative except where noted in HPI.  Review of Systems  Constitutional:  Positive for malaise/fatigue. Negative for chills and fever.  Respiratory:  Negative for cough.   Cardiovascular:  Negative for chest pain.  Gastrointestinal:  Negative for abdominal pain.  Musculoskeletal:  Positive for joint pain.  Skin:  Negative for itching and rash.  Neurological:  Negative for headaches.  Psychiatric/Behavioral:  Negative for substance abuse.   All other systems reviewed and are negative.    Physical Exam:  Vital signs in last 24 hours: Temp:  [98.5 F (36.9 C)-99.3 F (37.4 C)] 99.2 F (37.3 C) (08/29 1300) Pulse Rate:  [85-97] 96 (08/29 1500) Resp:  [20-29] 23 (08/29 1500) BP: (125-158)/(47-65) 128/48 (08/29 1500) SpO2:  [87 %-95 %] 95 % (08/29 1500) FiO2 (%):  [90 %] 90 % (08/29 1348) Last BM Date: 07/24/21 General:   Chronically ill appearing lady Head:  Normocephalic and  atraumatic. Eyes:   No icterus.   Conjunctiva pink. Mouth: Dry mucous membranes Neck:  Supple; no masses felt Lungs:  No respiratory distress but on hi-flo nasal cannula Abdomen:   Soft, nondistended, non-tender Rectal: Green stool in rectal bag Msk:  Edematous Neurologic:  Alert and  oriented x4 Skin:  Warm, dry, pink without significant lesions or rashes. Psych:  Alert and cooperative. Normal affect.  LAB RESULTS: Recent Labs    07/23/21 0634 07/24/21 0629  WBC 15.2* 15.5*  HGB 5.8* 7.2*  HCT 18.6* 21.1*  PLT 274 225   BMET Recent Labs    07/22/21 0516 07/22/21 1739 07/23/21 0634 07/24/21 0629  NA 135  --  138 135  K 2.8* 3.0* 3.1* 3.1*  CL 97*  --  100 101  CO2 28  --  25 23  GLUCOSE 154*  --  178* 96  BUN 41*  --  60* 65*  CREATININE 1.83*  --  2.55* 2.78*  CALCIUM 8.5*  --  8.9 8.6*   LFT No results  for input(s): PROT, ALBUMIN, AST, ALT, ALKPHOS, BILITOT, BILIDIR, IBILI in the last 72 hours. PT/INR No results for input(s): LABPROT, INR in the last 72 hours.  STUDIES: US RENAL  Result Date: 07/24/2021 CLINICAL DATA:  Sudden drop in hemoglobin.  Rule out renal hematoma. EXAM: RENAL / URINARY TRACT ULTRASOUND COMPLETE COMPARISON:  CT abdomen pelvis 07/20/2021 FINDINGS: Right Kidney: Renal measurements: 13.8 x 6.5 x 6.4 cm = volume: 301 mL. Echogenicity within normal limits. No mass or hydronephrosis visualized. 18 mm nonobstructing calculus noted in the lower pole. Left Kidney: Renal measurements: 13.0 x 6.7 x 7.0 cm = volume: 320 mL. Echogenicity within normal limits. No mass or hydronephrosis visualized. 16 mm nonobstructing calculus seen in the upper pole. 9.7 x 2.4 x 6.7 cm fluid collection noted adjacent to the left kidney. Bladder: Appears normal for degree of bladder distention. Other: None. IMPRESSION: 9.7 x 2.4 x 6.7 cm fluid collection adjacent to the left kidney most likely relates to residual abscess/phlegmon. Hyperacute hematoma could have a similar  appearance. Consider further evaluation with abdominal CT. Electronically Signed   By: Miachel Roux M.D.   On: 07/24/2021 14:51   DG Chest Port 1 View  Result Date: 07/24/2021 CLINICAL DATA:  Respiratory failure EXAM: PORTABLE CHEST 1 VIEW COMPARISON:  07/20/2021 FINDINGS: Gastric catheter is noted extending into the stomach. Left jugular central line is again seen and stable. Cardiac shadow is enlarged but stable. Vascular congestion is noted as well as basilar atelectasis overall unchanged from the prior exam. No pneumothorax is seen. No sizable effusion is noted. IMPRESSION: Stable bibasilar atelectasis. Mild vascular congestion is noted. Electronically Signed   By: Inez Catalina M.D.   On: 07/24/2021 15:43       Impression / Plan:   75 y/o lady with complicated hospital course due to perforated duodenum s/p surgical repair, respiratory failure on hi-flo nasal cannula, and AKI requiring dialysis for which we consulted for consideration of PEG and dark stool. Given high o2 requirements would not attempted endoscopically placed PEG tube at this time especially in light of recent duodenal perforation repair. The dark stool is bilious in nature and given hi O2 requirements, will recommend supportive care at this time. Patient is much more alert according to nursing staff.  - will hold off on PEG tube placement at this time given high o2 requirements. Could discuss with IR for consideration of radiologically placed PEG tube. Recommend repeat speech eval as patient much more alert now - consider changing NG tube to other nostril given length of time the current one has been in place - recommend PPI IV BID - continue to trend CBC, transfuse for hemoglobin < 7  We will continue to follow, please call with any questions or concerns.  Raylene Miyamoto MD, MPH [place]

## 2021-07-25 ENCOUNTER — Encounter: Payer: Self-pay | Admitting: Internal Medicine

## 2021-07-25 LAB — CBC WITH DIFFERENTIAL/PLATELET
Abs Immature Granulocytes: 0.49 10*3/uL — ABNORMAL HIGH (ref 0.00–0.07)
Basophils Absolute: 0.1 10*3/uL (ref 0.0–0.1)
Basophils Relative: 0 %
Eosinophils Absolute: 0.1 10*3/uL (ref 0.0–0.5)
Eosinophils Relative: 1 %
HCT: 24.6 % — ABNORMAL LOW (ref 36.0–46.0)
Hemoglobin: 8.1 g/dL — ABNORMAL LOW (ref 12.0–15.0)
Immature Granulocytes: 3 %
Lymphocytes Relative: 9 %
Lymphs Abs: 1.3 10*3/uL (ref 0.7–4.0)
MCH: 28.8 pg (ref 26.0–34.0)
MCHC: 32.9 g/dL (ref 30.0–36.0)
MCV: 87.5 fL (ref 80.0–100.0)
Monocytes Absolute: 0.7 10*3/uL (ref 0.1–1.0)
Monocytes Relative: 5 %
Neutro Abs: 12.2 10*3/uL — ABNORMAL HIGH (ref 1.7–7.7)
Neutrophils Relative %: 82 %
Platelets: 252 10*3/uL (ref 150–400)
RBC: 2.81 MIL/uL — ABNORMAL LOW (ref 3.87–5.11)
RDW: 16.2 % — ABNORMAL HIGH (ref 11.5–15.5)
WBC: 14.9 10*3/uL — ABNORMAL HIGH (ref 4.0–10.5)
nRBC: 0.2 % (ref 0.0–0.2)

## 2021-07-25 LAB — BASIC METABOLIC PANEL
Anion gap: 15 (ref 5–15)
BUN: 70 mg/dL — ABNORMAL HIGH (ref 8–23)
CO2: 22 mmol/L (ref 22–32)
Calcium: 8.9 mg/dL (ref 8.9–10.3)
Chloride: 101 mmol/L (ref 98–111)
Creatinine, Ser: 2.88 mg/dL — ABNORMAL HIGH (ref 0.44–1.00)
GFR, Estimated: 17 mL/min — ABNORMAL LOW (ref >60)
Glucose, Bld: 156 mg/dL — ABNORMAL HIGH (ref 70–99)
Potassium: 3.4 mmol/L — ABNORMAL LOW (ref 3.5–5.1)
Sodium: 138 mmol/L (ref 135–145)

## 2021-07-25 LAB — GLUCOSE, CAPILLARY
Glucose-Capillary: 141 mg/dL — ABNORMAL HIGH (ref 70–99)
Glucose-Capillary: 168 mg/dL — ABNORMAL HIGH (ref 70–99)
Glucose-Capillary: 173 mg/dL — ABNORMAL HIGH (ref 70–99)
Glucose-Capillary: 177 mg/dL — ABNORMAL HIGH (ref 70–99)
Glucose-Capillary: 195 mg/dL — ABNORMAL HIGH (ref 70–99)
Glucose-Capillary: 196 mg/dL — ABNORMAL HIGH (ref 70–99)

## 2021-07-25 LAB — MAGNESIUM: Magnesium: 2 mg/dL (ref 1.7–2.4)

## 2021-07-25 MED ORDER — HEPARIN SODIUM (PORCINE) 5000 UNIT/ML IJ SOLN
5000.0000 [IU] | Freq: Three times a day (TID) | INTRAMUSCULAR | Status: DC
  Administered 2021-07-25 – 2021-08-03 (×22): 5000 [IU] via SUBCUTANEOUS
  Filled 2021-07-25 (×23): qty 1

## 2021-07-25 MED ORDER — CEFAZOLIN IN SODIUM CHLORIDE 3-0.9 GM/100ML-% IV SOLN
3.0000 g | INTRAVENOUS | Status: AC
  Filled 2021-07-25: qty 100

## 2021-07-25 MED ORDER — POTASSIUM CHLORIDE 20 MEQ PO PACK
20.0000 meq | PACK | Freq: Once | ORAL | Status: AC
  Administered 2021-07-25: 20 meq
  Filled 2021-07-25: qty 1

## 2021-07-25 NOTE — Progress Notes (Signed)
[facility]  ROUNDING NOTE   Subjective:   Heather Lopez is a 75 y.o. white female who has been admitted for community acquired pneumonia.    Hospital course complicated by duodenal perforation s/p repair on 06/30/2021 Requiring hemodialysis.  Left obstructive hydronephrosis and renal abscess, Patient underwent cystoscopy and b/l ureteral stent placement 8/10 8/26- CT guided aspiration of perirenal abscess- 5 ml purulent fluid aspirated Cultures neg so far  UOP has improved with foley placement Lab Results  Component Value Date   CREATININE 2.88 (H) 07/25/2021   CREATININE 2.78 (H) 07/24/2021   CREATININE 2.55 (H) 07/23/2021   08/29 0701 - 08/30 0700 In: 4881 [I.V.:20; DD/UK:0254; IV Piggyback:2377] Out: 2706 [Urine:2650; Drains:10; Stool:700]   Continues to have low-grade fever of 99 Tube feeds Nepro 60 cc/h via NG tube  Nasal cannula 50 L/min oxygen supplementation BiPAP at night Mouth appears dry Patient is lethargic but able to follow simple commands Husband at bedside   Objective:  Vital signs in last 24 hours:  Temp:  [98.2 F (36.8 C)-99.9 F (37.7 C)] 99.9 F (37.7 C) (08/30 0800) Pulse Rate:  [80-103] 103 (08/30 1100) Resp:  [20-28] 21 (08/30 0500) BP: (127-171)/(47-65) 157/54 (08/30 1100) SpO2:  [88 %-100 %] 92 % (08/30 1100) FiO2 (%):  [86 %-95 %] 95 % (08/30 1300)  Weight change:  Filed Weights   07/17/21 0500 07/18/21 0500 07/22/21 0500  Weight: 125.5 kg 125.9 kg 125.9 kg    Intake/Output: I/O last 3 completed shifts: In: 6336.2 [I.V.:25; CB/JS:2831; IV Piggyback:3627.2] Out: 5176 [Urine:3600; Drains:20; Stool:900]   Intake/Output this shift:  Total I/O In: 405 [I.V.:40; NG/GT:365] Out: 1100 [Urine:1100]  Physical Exam: General: Critically ill  Head: [place] O2, NGT in place  Eyes: Anicteric  Lungs:  +coarse breath sounds  Heart: tachycardia  Abdomen:  +JP drains, midline incision  Extremities:  + peripheral edema   Neurologic:   Following commands  GU Foley with yellow urine  Access:  LIJ permcath 8/18  Rectal tube  Basic Metabolic Panel: Recent Labs  Lab 07/19/21 0449 07/20/21 0601 07/21/21 0528 07/22/21 0516 07/22/21 1739 07/23/21 0634 07/24/21 0629 07/24/21 1642 07/25/21 0616  NA 141 136 137 135  --  138 135  --  138  K 3.2* 4.1 2.9* 2.8* 3.0* 3.1* 3.1* 3.7 3.4*  CL 100 99 99 97*  --  100 101  --  101  CO2 30 25 28 28   --  25 23  --  22  GLUCOSE 251* 190* 223* 154*  --  178* 96  --  156*  BUN 87* 55* 60* 41*  --  60* 65*  --  70*  CREATININE 2.08* 2.04* 2.35* 1.83*  --  2.55* 2.78*  --  2.88*  CALCIUM 9.1 8.9 8.9 8.5*  --  8.9 8.6*  --  8.9  MG 2.2 1.9 1.9 1.8 2.0 2.2 1.9  --  2.0  PHOS 3.6 2.7  --   --   --   --   --   --   --      Liver Function Tests: Recent Labs  Lab 07/21/21 0528  AST 33  ALT 53*  ALKPHOS 101  BILITOT 0.5  PROT 5.8*  ALBUMIN 1.6*    No results for input(s): LIPASE, AMYLASE in the last 168 hours. No results for input(s): AMMONIA in the last 168 hours.  CBC: Recent Labs  Lab 07/20/21 0601 07/21/21 0528 07/23/21 0634 07/24/21 0629 07/25/21 0616  WBC 10.7*  15.4* 15.2* 15.5* 14.9*  NEUTROABS  --  13.2* 11.3* 12.4* 12.2*  HGB 8.6* 7.4* 5.8* 7.2* 8.1*  HCT 28.5* 23.5* 18.6* 21.1* 24.6*  MCV 91.6 85.8 87.3 86.8 87.5  PLT 202 303 274 225 252     Cardiac Enzymes: No results for input(s): CKTOTAL, CKMB, CKMBINDEX, TROPONINI in the last 168 hours.  BNP: Invalid input(s): POCBNP  CBG: Recent Labs  Lab 07/24/21 2023 07/24/21 2316 07/25/21 0327 07/25/21 0756 07/25/21 1120  GLUCAP 151* 160* 173* 141* 177*     Microbiology: Results for orders placed or performed during the hospital encounter of 06/27/21  Resp Panel by RT-PCR (Flu A&B, Covid) Nasopharyngeal Swab     Status: None   Collection Time: 06/27/21 12:32 AM   Specimen: Nasopharyngeal Swab; Nasopharyngeal(NP) swabs in vial transport medium  Result Value Ref Range Status   SARS Coronavirus 2  by RT PCR NEGATIVE NEGATIVE Final    Comment: (NOTE) SARS-CoV-2 target nucleic acids are NOT DETECTED.  The SARS-CoV-2 RNA is generally detectable in upper respiratory specimens during the acute phase of infection. The lowest concentration of SARS-CoV-2 viral copies this assay can detect is 138 copies/mL. A negative result does not preclude SARS-Cov-2 infection and should not be used as the sole basis for treatment or other patient management decisions. A negative result may occur with  improper specimen collection/handling, submission of specimen other than nasopharyngeal swab, presence of viral mutation(s) within the areas targeted by this assay, and inadequate number of viral copies(<138 copies/mL). A negative result must be combined with clinical observations, patient history, and epidemiological information. The expected result is Negative.  Fact Sheet for Patients:  EntrepreneurPulse.com.au  Fact Sheet for Healthcare Providers:  IncredibleEmployment.be  This test is no t yet approved or cleared by the [place] FDA and  has been authorized for detection and/or diagnosis of SARS-CoV-2 by FDA under an Emergency Use Authorization (EUA). This EUA will remain  in effect (meaning this test can be used) for the duration of the COVID-19 declaration under Section 564(b)(1) of the Act, 21 U.S.C.section 360bbb-3(b)(1), unless the authorization is terminated  or revoked sooner.       Influenza A by PCR NEGATIVE NEGATIVE Final   Influenza B by PCR NEGATIVE NEGATIVE Final    Comment: (NOTE) The Xpert Xpress SARS-CoV-2/FLU/RSV plus assay is intended as an aid in the diagnosis of influenza from Nasopharyngeal swab specimens and should not be used as a sole basis for treatment. Nasal washings and aspirates are unacceptable for Xpert Xpress SARS-CoV-2/FLU/RSV testing.  Fact Sheet for Patients: EntrepreneurPulse.com.au  Fact  Sheet for Healthcare Providers: IncredibleEmployment.be  This test is not yet approved or cleared by the [place] FDA and has been authorized for detection and/or diagnosis of SARS-CoV-2 by FDA under an Emergency Use Authorization (EUA). This EUA will remain in effect (meaning this test can be used) for the duration of the COVID-19 declaration under Section 564(b)(1) of the Act, 21 U.S.C. section 360bbb-3(b)(1), unless the authorization is terminated or revoked.  Performed at [facility], [address] [place], [place] [address]   Blood culture (routine x 2)     Status: None   Collection Time: 06/27/21 12:43 AM   Specimen: BLOOD  Result Value Ref Range Status   Specimen Description BLOOD RIGHT ASSIST CONTROL  Final   Special Requests   Final    BOTTLES DRAWN AEROBIC AND ANAEROBIC Blood Culture results may not be optimal due to an inadequate volume of blood received in  culture bottles   Culture   Final    NO GROWTH 5 DAYS Performed at [facility], [address] [place], [place] [address]    Report Status 07/02/2021 FINAL  Final  Blood culture (routine x 2)     Status: None   Collection Time: 06/27/21  1:45 AM   Specimen: BLOOD  Result Value Ref Range Status   Specimen Description BLOOD RIGHT ASSIST CONTROL  Final   Special Requests   Final    BOTTLES DRAWN AEROBIC AND ANAEROBIC Blood Culture adequate volume   Culture   Final    NO GROWTH 5 DAYS Performed at [facility], [address] [place], [place] [address]    Report Status 07/02/2021 FINAL  Final  Urine Culture     Status: Abnormal   Collection Time: 06/27/21  2:39 AM   Specimen: In/Out Cath Urine  Result Value Ref Range Status   Specimen Description   Final    IN/OUT CATH URINE Performed at [facility], [address], [place], [place] [address]    Special Requests   Final    NONE Performed at [facility], [place], [place] [address]    Culture 30,000 COLONIES/mL YEAST (A)  Final   Report Status 06/28/2021 FINAL  Final  MRSA Next Gen by PCR, Nasal     Status: None   Collection Time: 06/30/21  5:01 AM   Specimen: Nasal Mucosa; Nasal Swab  Result Value Ref Range Status   MRSA by PCR Next Gen NOT DETECTED NOT DETECTED Final    Comment: (NOTE) The GeneXpert MRSA Assay (FDA approved for NASAL specimens only), is one component of a comprehensive MRSA colonization surveillance program. It is not intended to diagnose MRSA infection nor to guide or monitor treatment for MRSA infections. Test performance is not FDA approved in patients less than 69 years old. Performed at [facility], [address], [place], [place] [address]   Urine Culture     Status: Abnormal   Collection Time: 07/05/21 10:36 AM   Specimen: PATH Other; Urine  Result Value Ref Range Status   Specimen Description   Final    URINE, RANDOM LEFT RENAL PELVIS URINE Performed at [facility], [address] [place], [place] [address]    Special Requests   Final    NONE Performed at [facility], [address] [place], [place] [address]    Culture 70,000 [place] (A)  Final   Report Status 07/12/2021 FINAL  Final  Urine Culture     Status: Abnormal   Collection Time: 07/05/21 10:48 AM   Specimen: PATH Other; Urine  Result Value Ref Range Status   Specimen Description   Final    URINE, RANDOM RIGHT KIDNEY URINE Performed at [facility], [address] [place], [place] [address]    Special Requests   Final    NONE Performed at [facility], [address], [place], [place] [address]    Culture (A)  Final    >=100,000 COLONIES/mL CANDIDA GLABRATA SEE SEPARATE REPORT Performed at [facility], [place] [address], [place], [place] [address]    Report Status 07/17/2021 FINAL  Final  Antifungal AST 9 Drug Panel     Status: None   Collection Time:  07/05/21 10:48 AM  Result Value Ref Range Status   Organism ID, Yeast Candida glabrata  Corrected    Comment: (NOTE) Identification performed by  account, not confirmed by this laboratory. CORRECTED ON 08/21 AT 1535: PREVIOUSLY REPORTED AS Preliminary report    Amphotericin B MIC 0.5 ug/mL  Final    Comment: (NOTE) Breakpoints have been established for only some organism-drug combinations as indicated. This test was developed and its performance characteristics determined by [facility]. It has not been cleared or approved by the Food and Drug Administration.    Anidulafungin MIC Comment  Final    Comment: (NOTE) 0.03 ug/mL Susceptible Breakpoints have been established for only some organism-drug combinations as indicated. This test was developed and its performance characteristics determined by [facility]. It has not been cleared or approved by the Food and Drug Administration.    Caspofungin MIC Comment  Final    Comment: (NOTE) 0.06 ug/mL Susceptible Breakpoints have been established for only some organism-drug combinations as indicated. This test was developed and its performance characteristics determined by [facility]. It has not been cleared or approved by the Food and Drug Administration.    Micafungin MIC Comment  Final    Comment: (NOTE) 0.016 ug/mL Susceptible Breakpoints have been established for only some organism-drug combinations as indicated. This test was developed and its performance characteristics determined by [facility]. It has not been cleared or approved by the Food and Drug Administration.    Posaconazole MIC 0.25 ug/mL  Final    Comment: (NOTE) Breakpoints have been established for only some organism-drug combinations as indicated. This test was developed and its performance characteristics determined by [facility]. It has not been cleared or approved by the Food and Drug Administration.    Fluconazole Islt MIC 2.0 ug/mL  Final    Comment:  (NOTE) Susceptible Dose Dependent Breakpoints have been established for only some organism-drug combinations as indicated. This test was developed and its performance characteristics determined by [facility]. It has not been cleared or approved by the Food and Drug Administration.    Flucytosine MIC 0.06 ug/mL or less  Final    Comment: (NOTE) Breakpoints have been established for only some organism-drug combinations as indicated. This test was developed and its performance characteristics determined by [facility]. It has not been cleared or approved by the Food and Drug Administration.    Itraconazole MIC 0.12 ug/mL  Final    Comment: (NOTE) Breakpoints have been established for only some organism-drug combinations as indicated. This test was developed and its performance characteristics determined by [facility]. It has not been cleared or approved by the Food and Drug Administration.    Voriconazole MIC 0.06 ug/mL  Final    Comment: (NOTE) Breakpoints have been established for only some organism-drug combinations as indicated. This test was developed and its performance characteristics determined by [facility]. It has not been cleared or approved by the Food and Drug Administration. Performed At: [facility] [address] [place], [place] [id number] Rush Farmer MD [id number]    Source CANDIDA GLABRATA SUSCEPTIBILITY URINE CULTURE  Final    Comment: Performed at [facility], [place] [address], [place], [place] [address]  CULTURE, BLOOD (ROUTINE X 2) w Reflex to ID Panel     Status: None   Collection Time: 07/15/21  7:57 PM   Specimen: BLOOD  Result Value Ref Range Status   Specimen Description BLOOD LEFT ASSIST CONTROL  Final   Special Requests   Final    BOTTLES DRAWN AEROBIC AND ANAEROBIC Blood Culture adequate volume   Culture   Final    NO GROWTH 5 DAYS Performed at [facility], [place],  [place], [place] [address]    Report Status  07/20/2021 FINAL  Final  CULTURE, BLOOD (ROUTINE X 2) w Reflex to ID Panel     Status: None   Collection Time: 07/15/21  8:07 PM   Specimen: BLOOD  Result Value Ref Range Status   Specimen Description BLOOD LEFT FOREARM  Final   Special Requests   Final    BOTTLES DRAWN AEROBIC AND ANAEROBIC Blood Culture adequate volume   Culture   Final    NO GROWTH 5 DAYS Performed at [facility], [address], [place], [place] [address]    Report Status 07/20/2021 FINAL  Final  Body fluid culture w Gram Stain     Status: None (Preliminary result)   Collection Time: 07/21/21  1:23 PM   Specimen: Peritoneal Washings; Body Fluid  Result Value Ref Range Status   Specimen Description   Final    PERITONEAL Performed at [facility], [address], [place], [place] [address]    Special Requests   Final    NONE Performed at [facility], [address] [place], [place] [address]    Gram Stain   Final    MODERATE WBC PRESENT,BOTH PMN AND MONONUCLEAR NO ORGANISMS SEEN Performed at [facility], [place] [address], [place], [place] [address]    Culture RARE YEAST  Final   Report Status PENDING  Incomplete  Aerobic/Anaerobic Culture w Gram Stain (surgical/deep wound)     Status: None (Preliminary result)   Collection Time: 07/21/21  3:32 PM   Specimen: Abdomen  Result Value Ref Range Status   Specimen Description   Final    ABDOMEN Performed at [facility], [address] [place], [place] [address]    Special Requests   Final    NONE Performed at [facility], [address] [place], [place] [address]    Gram Stain   Final    MODERATE SQUAMOUS EPITHELIAL CELLS PRESENT WBC PRESENT, PREDOMINANTLY PMN NO ORGANISMS SEEN    Culture   Final    RARE YEAST IDENTIFICATION TO FOLLOW Performed at [facility], [place] [address], [place], [place] [address]    Report Status PENDING  Incomplete    Coagulation Studies: No results for  input(s): LABPROT, INR in the last 72 hours.    Urinalysis: No results for input(s): COLORURINE, LABSPEC, PHURINE, GLUCOSEU, HGBUR, BILIRUBINUR, KETONESUR, PROTEINUR, UROBILINOGEN, NITRITE, LEUKOCYTESUR in the last 72 hours.  Invalid input(s): APPERANCEUR      Imaging: US RENAL  Result Date: 07/24/2021 CLINICAL DATA:  Sudden drop in hemoglobin.  Rule out renal hematoma. EXAM: RENAL / URINARY TRACT ULTRASOUND COMPLETE COMPARISON:  CT abdomen pelvis 07/20/2021 FINDINGS: Right Kidney: Renal measurements: 13.8 x 6.5 x 6.4 cm = volume: 301 mL. Echogenicity within normal limits. No mass or hydronephrosis visualized. 18 mm nonobstructing calculus noted in the lower pole. Left Kidney: Renal measurements: 13.0 x 6.7 x 7.0 cm = volume: 320 mL. Echogenicity within normal limits. No mass or hydronephrosis visualized. 16 mm nonobstructing calculus seen in the upper pole. 9.7 x 2.4 x 6.7 cm fluid collection noted adjacent to the left kidney. Bladder: Appears normal for degree of bladder distention. Other: None. IMPRESSION: 9.7 x 2.4 x 6.7 cm fluid collection adjacent to the left kidney most likely relates to residual abscess/phlegmon. Hyperacute hematoma could have a similar appearance. Consider further evaluation with abdominal CT. Electronically Signed   By: Miachel Roux M.D.   On: 07/24/2021 14:51  DG Chest Port 1 View  Result Date: 07/24/2021 CLINICAL DATA:  Respiratory failure EXAM: PORTABLE CHEST 1 VIEW COMPARISON:  07/20/2021 FINDINGS: Gastric catheter is noted extending into the stomach. Left jugular central line is again seen and stable. Cardiac shadow is enlarged but stable. Vascular congestion is noted as well as basilar atelectasis overall unchanged from the prior exam. No pneumothorax is seen. No sizable effusion is noted. IMPRESSION: Stable bibasilar atelectasis. Mild vascular congestion is noted. Electronically Signed   By: Inez Catalina M.D.   On: 07/24/2021 15:43     Medications:    sodium  chloride Stopped (07/23/21 0603)   amphotericin B  Conventional (FUNGIZONE) IV 40 mg (07/24/21 2202)   feeding supplement (GLUCERNA 1.5 CAL) 1,000 mL (07/24/21 1656)   feeding supplement (GLUCERNA 1.5 CAL)      sodium chloride   Intravenous Once   acetaminophen (TYLENOL) oral liquid 160 mg/5 mL  650 mg Per Tube Q24H   amLODipine  10 mg Per Tube Daily   chlorhexidine  15 mL Mouth Rinse BID   Chlorhexidine Gluconate Cloth  6 each Topical Q0600   cloNIDine  0.1 mg Per Tube Daily   collagenase   Topical Daily   dextrose  10 mL Intravenous Q24H   dextrose  10 mL Intravenous Q24H   [START ON 07/28/2021] epoetin (EPOGEN/PROCRIT) injection  20,000 Units Subcutaneous Weekly   free water  125 mL Per Tube Q4H   furosemide  80 mg Intravenous Q12H   heparin injection (subcutaneous)  5,000 Units Subcutaneous Q8H   insulin aspart  0-20 Units Subcutaneous Q4H   insulin aspart  6 Units Subcutaneous Q4H   insulin detemir  30 Units Subcutaneous BID   mouth rinse  15 mL Mouth Rinse q12n4p   metoprolol tartrate  100 mg Per Tube BID   multivitamin  1 tablet Per Tube QHS   nutrition supplement (JUVEN)  1 packet Per Tube BID BM   pantoprazole (PROTONIX) IV  40 mg Intravenous Q12H   potassium chloride  20 mEq Per Tube Once   sodium chloride  500 mL Intravenous Q24H   sodium chloride  500 mL Intravenous Q24H   sodium chloride flush  5 mL Intracatheter Q8H     Assessment/ Plan:  Heather Lopez is a 75 y.o. white female with diabetes mellitus type II, hypertension, hyperlipidemia, nephrolithiasis, depression, gout who is admitted to [facility] on 06/27/2021 for Hyperkalemia [E87.5] Hyponatremia [E87.1] ARF (acute renal failure) (HCC) [N17.9] Acute renal failure (ARF) (HCC) [N17.9] Acute respiratory failure with hypoxia ([place]) [J96.01] Demand ischemia of myocardium (HCC) [I24.8] Acute respiratory failure with hypoxemia (HCC) [J96.01] Acute renal failure superimposed on chronic kidney disease, unspecified CKD stage,  unspecified acute renal failure type ([place]) [N17.9, N18.9] Sepsis, due to unspecified organism, unspecified whether acute organ dysfunction present [facility]) [A41.9]  Acute Kidney Injury on chronic kidney disease stage IV with baseline creatinine 1.97 and GFR of 25 on 12/22/19.  Acute kidney injury secondary to sepsis and obstructive uropathy. Chronic kidney disease is secondary to diabetic nephropathy.Nonoliguric urine output.  - Continue to hold losartan and metformin -Urine output is fair.  Electrolytes and volume status are acceptable.  No acute indication for dialysis at present.  However, in light of rising creatinine, will monitor closely on a daily basis.  Lab Results  Component Value Date   CREATININE 2.88 (H) 07/25/2021   CREATININE 2.78 (H) 07/24/2021   CREATININE 2.55 (H) 07/23/2021    Intake/Output Summary (Last 24 hours) at 07/25/2021  [place] filed at 07/25/2021 1100 Gross per 24 hour  Intake 5285.99 ml  Output 4460 ml  Net 825.99 ml   UOP 2650 cc yesterday.  1100 cc so far today  2. Acute Respiratory failure secondary to community acquired pneumonia failing outpatient antibiotics. Febrile.  - Continue supplement oxygen.  - IV amphotericin B - 8/20 blood cultures with no growth    3. Anemia of chronic kidney disease Normocytic Lab Results  Component Value Date   HGB 8.1 (L) 07/25/2021   -We will change to EPO to subcu so that administration is consistent.  4.  Diabetes mellitus type II with chronic kidney disease insulin dependent. Most recent hemoglobin A1c is 8.3% on 06/12/21.  Metformin held    5.Bilateral Nephrolithiasis- with obstructive uropathy and left renal abscess  6. Hypokalemia: - Continue potassium supplementation as needed  7.  Hypertension.  Currently being treated with Norvasc, clonidine, IV furosemide, metoprolol     LOS: [place] 8/30/20221:50 PM

## 2021-07-25 NOTE — Progress Notes (Signed)
  Speech Language Pathology Treatment: Dysphagia  Patient Details Name: Heather Lopez MRN: [id number] DOB: [birth year] Today's Date: 07/25/2021 Time: 1220-1300 SLP Time Calculation (min) (ACUTE ONLY): 40 min  Assessment / Plan / Recommendation Clinical Impression  Pt visited for ongoing assessment of swallowing and readiness for PO's. Chart reviewed and noted GI consult for possible PEG last PM. Per GI, Pt is not appropriate for endoscopic placement of PEG secondary to her current respiratory needs. Pt currently has NGT for nutrition. Today, Pt was able to participate and follow directions adequately for assessment. After oral care, she tolerated 5 tsp of water, 8 bites of applesauce and 2 tsp of nectar thick liquids. Cues needed to place her lips around the spoon especially at the end of the session as Pt showed fatigue. O2 sats stayed at 95% when taking PO's. No overt s/s of aspiration, vocal quality remained clear. Pt swallowed in a timely manor not needing cues until the end of the session. No oral residue after the swallow. Pt still has high flow nasal canula and is at risk for aspiration when not alert. Rec consider removing NGT for a 24 hr trial of PO diet Dysphagia 1 with nectar thick liquids BY TSP. Strict aspiration precautions, only feed when alert and when Pt is able to take the PO's off the spoon. Allow periods of rest between bites, monitor o2 sats. Stop feeding if any s/s of aspiration. Discussed recs with nsg and sent secure chat to MD. Will f/u with MD decision and assist as needed. Would not recommend starting diet until NGT has been removed. Meds crushed in applesauce or though IV.   HPI HPI: Pt is a [name] who presented to the ED on 06/27/21 with chief complaints of progressive shortness of breath, productive cough, loss of appetite, nausea, dry heaving fatigue, malaise and generalized body aches. Patient initially went to her PCP on 7/29 with complaints of rhinorrhea, harsh cough, loss of  appetite, nausea, fatigue, malaise and generalized body aches x2 days, patient was diagnosed with pneumonia of which she received ceftriaxone 1 mg IM and sent home with Levaquin, prednisone and Tessalon. On 8/4 pt developed acute abdominal pain with CT showing viscus perforation. Pt urgently taken to the OR for ex lap. Pt is also s/p cystoscopy with B ureteral stent placement for L obstructed kidney stone on 8/10. Pt was orally intubated until 07/14/2021.  CXR: 07/18/21 - Larger lung volumes with regressed but not resolved lung base  atelectasis. No new cardiopulmonary abnormality.      SLP Plan  Continue with current plan of care       Recommendations  Diet recommendations: Dysphagia 1 (puree);Honey-thick liquid;Other(comment) (ONLY when alert and if NGT is removed) Liquids provided via: Teaspoon Medication Administration: Crushed with puree Supervision: Full supervision/cueing for compensatory strategies Compensations: Minimize environmental distractions;Slow rate;Small sips/bites Postural Changes and/or Swallow Maneuvers: Seated upright 90 degrees;Upright 30-60 min after meal                Oral Care Recommendations: Oral care QID;Oral care prior to ice chip/H20;Staff/trained caregiver to provide oral care Follow up Recommendations: Skilled Nursing facility SLP Visit Diagnosis: Dysphagia, oropharyngeal phase (R13.12) Plan: Continue with current plan of care       Heather Lopez 07/25/2021, 1:47 PM

## 2021-07-25 NOTE — TOC Progression Note (Signed)
Transition of Care [facility]) - Progression Note    Patient Details  Name: ALLYSEN LAZO MRN: [id number] Date of Birth: Feb 28, 1946  Transition of Care [facility]) CM/SW [place], RN Phone Number: 07/25/2021, 10:20 AM  Clinical Narrative:  Received text from [facility] about discharge today for patient, which will require dialysis and if PEG tube is needed it can be placed at [facility]. Consulted with Attending who says patient is not stable for discharge today, [facility] notified.     Expected Discharge Plan: [place] Barriers to Discharge: Continued Medical Work up  Expected Discharge Plan and Services Expected Discharge Plan: [place] In-house Referral: Clinical Social Work   Post Acute Care Choice: [place] Living arrangements for the past 2 months: Single Family Home                                       Social Determinants of Health (SDOH) Interventions    Readmission Risk Interventions No flowsheet data found.

## 2021-07-25 NOTE — Progress Notes (Addendum)
PROGRESS NOTE    Heather Lopez  IRJ:[id number] DOB: [birth year] DOA: 06/27/2021 PCP: [name], PA-C   Chief complaint.  Shortness of breath. Brief Narrative:   Heather Lopez is a 75 y.o. female with history of hypertension, diabetes mellitus, hyperlipidemia has been feeling weak for the last 3 to 4 days.  Patient had a acute renal failure with creatinine of 9.3, potassium 7.5, acute metabolic acidosis with pH 7.15, procalcitonin 6.83.  Patient was diagnosed was septic shock, acute respiratory failure. On night of 8/4, patient developed acute abdominal pain, CT scan showed acute abdomen secondary to perforated duodenum.  Patient was placed on mechanical ventilation after duodenum repair. Patient had a bilateral hydronephrosis, ureteral stents was placed on 8/10. Patient urine culture was positive for Candida glabrata.  ID consult was obtained, patient currently on high-dose of Diflucan. 8/26. Due to persistent fever, CT scan of abdomen/pelvis was repeated on 8/26, aspiration pararenal fluids was obtained, removed 5 mL of pus.  ID changed antibiotic to Amphotericin in addition to Zosyn  Assessment & Plan:   Active Problems:   Uncontrolled type 2 diabetes mellitus with insulin therapy ([place])   Hyperlipidemia associated with type 2 diabetes mellitus ([place])   Type 2 diabetes mellitus with stage 4 chronic kidney disease, with long-term current use of insulin (HCC)   Acute respiratory failure with hypoxia (HCC)   Severe sepsis with septic shock (HCC)   Acute lower UTI   Acute kidney injury superimposed on chronic kidney disease (HCC)   Pneumoperitoneum   Pressure injury of skin   Atrial flutter with rapid ventricular response (HCC)   Endotracheally intubated   Bilateral nephrolithiasis   Anemia of chronic disease   Demand ischemia of myocardium (HCC)   Lethargy   Duodenal ulcer perforation ([place])   Renal abscess   Essential hypertension   Hypokalemia  Acute metabolic  encephalopathy. Sepsis with septic shock secondary to obstructive uropathy, kidney stone.  Status post bilateral ureteral stents. Candida urinary tract infection Perirenal abscess. Perforated duodenum. S/p repair. Pararenal drain by IR only removed 5 mL, cultures so far has no growth.  Repeat ultrasound still has significant pararenal abscess. This thought to be secondary to fungal infection with Candida glabrata.  Patient is on Amphotericin per ID. Patient also s/p bilateral ureteral stents.  Last CT scan showed persistent bilateral hydronephrosis, urology reconsulted, believe this is due to urinary retention.  Foley catheter was anchored. As for patient duodenal perforation, patient had surgical repair, condition had improved.  Patient has multiple bowel movements. She is tolerating tube feeding.  Acute hypoxemic respiratory failure secondary to septic shock. Morbid obesity and obesity hypoventilation syndrome  Patient has persistent hypoxemia, she had increased oxygen requirement yesterday, she was using 90% of heated high flow.  She was started on IV Lasix, oxygen was down to 40%.  She most likely has acute on chronic diastolic congestive heart failure.  Her echo on 06/27/2021 showed ejection fraction 50 to 55%. Continue IV Lasix 80 mg twice a day.  Acute kidney injury on chronic kidney disease stage IV. Hypokalemia. Patient is followed by nephrology, initially dialyzed, she has not been requiring hemodialysis for the last few days. Continue to replete potassium.  Iron deficiency anemia Likely acute blood loss anemia. Possible GI bleed. Patient has been transfused, also received IV iron.  No B12 deficiency.  She has  black stool from the rectal tube.  GI consult is obtained.  Continued on Protonix IV twice a day. GI is not considering  EGD due to high risk of procedure and  patient has severe hypoxemia. Continue to follow hemoglobin and transfuse as needed.   Uncontrolled type 2  diabetes with hyperglycemia Continue current regimen  Dysphagia. Patient has significant dysphagia, has not been able to take any p.o.  Currently receiving tube feeding through NG tube.  Discussed with GI, family still request a PEG tube placement.  However, due to hypoxemia, the procedure was deemed to be high risk. Spoke with IR, PEG tube tomorrow.  DVT prophylaxis: Heparin Code Status: DNR Family Communication: I had a several conversation with the family over the course of the week, patient prognosis guarded.  Long-term prognosis is quite poor.  But family is not ready for comfort care. Disposition Plan:    Status is: Inpatient  Remains inpatient appropriate because:IV treatments appropriate due to intensity of illness or inability to take PO and Inpatient level of care appropriate due to severity of illness  Dispo: The patient is from: Home              Anticipated d/c is to: LTAC              Patient currently is not medically stable to d/c.   Difficult to place patient No  Patient has been accepted to [facility] in [place].  Need to address the issue of possible GI bleed before discharge.      I/O last 3 completed shifts: In: 6336.2 [I.V.:25; GM/WN:0272; IV Piggyback:3627.2] Out: 5366 [Urine:3600; Drains:20; Stool:900] Total I/O In: 165 [I.V.:40; NG/GT:125] Out: 58 [Urine:1100]     Consultants:  Nephrology, ID  Procedures: Duodenum, bilateral ureteral stents.  Antimicrobials:  Amphotericin Subjective: Patient mental status waxing waning, she has some confusion this morning. She is tolerating tube feeding, she has not been able to eat. She has short of breath with minimal exertion, she is still on 40% of her heated high flow oxygen. She has some loose stool in the rectal tube, looks black. She has no fever chills  She still making large amount of urine, no dysuria hematuria.  Objective: Vitals:   07/25/21 0800 07/25/21 0900 07/25/21 1000 07/25/21 1100  BP:  (!) 161/62 (!) 167/63 (!) 156/52 (!) 157/54  Pulse: (!) 103 (!) 101 (!) 102 (!) 103  Resp:      Temp: 99.9 F (37.7 C)     TempSrc: Oral     SpO2: (!) 88% (!) 88% 90% 92%  Weight:      Height:        Intake/Output Summary (Last 24 hours) at 07/25/2021 1155 Last data filed at 07/25/2021 1100 Gross per 24 hour  Intake 5045.99 ml  Output 4460 ml  Net 585.99 ml   Filed Weights   07/17/21 0500 07/18/21 0500 07/22/21 0500  Weight: 125.5 kg 125.9 kg 125.9 kg    Examination:  General exam: Ill-appearing, morbid obese. Respiratory system: Crackles in the base.[name] Respiratory effort normal. Cardiovascular system: S1 & S2 heard, RRR. No JVD, murmurs, rubs, gallops or clicks. No pedal edema. Gastrointestinal system: Abdomen is nondistended, soft and nontender. No organomegaly or masses felt. Normal bowel sounds heard. Central nervous system: Drowsy and oriented x2. No focal neurological deficits. Extremities: Symmetric 5 x 5 power. Skin: No rashes, lesions or ulcers Psychiatry: Judgement and insight appear normal. Mood & affect appropriate.     Data Reviewed: I have personally reviewed following labs and imaging studies  CBC: Recent Labs  Lab 07/20/21 0601 07/21/21 4403 07/23/21 4742 07/24/21 5956 07/25/21 3875  WBC 10.7* 15.4* 15.2* 15.5* 14.9*  NEUTROABS  --  13.2* 11.3* 12.4* 12.2*  HGB 8.6* 7.4* 5.8* 7.2* 8.1*  HCT 28.5* 23.5* 18.6* 21.1* 24.6*  MCV 91.6 85.8 87.3 86.8 87.5  PLT 202 303 274 225 867   Basic Metabolic Panel: Recent Labs  Lab 07/19/21 0449 07/20/21 0601 07/21/21 0528 07/22/21 0516 07/22/21 1739 07/23/21 0634 07/24/21 0629 07/24/21 1642 07/25/21 0616  NA 141 136 137 135  --  138 135  --  138  K 3.2* 4.1 2.9* 2.8* 3.0* 3.1* 3.1* 3.7 3.4*  CL 100 99 99 97*  --  100 101  --  101  CO2 30 25 28 28   --  25 23  --  22  GLUCOSE 251* 190* 223* 154*  --  178* 96  --  156*  BUN 87* 55* 60* 41*  --  60* 65*  --  70*  CREATININE 2.08* 2.04* 2.35* 1.83*  --   2.55* 2.78*  --  2.88*  CALCIUM 9.1 8.9 8.9 8.5*  --  8.9 8.6*  --  8.9  MG 2.2 1.9 1.9 1.8 2.0 2.2 1.9  --  2.0  PHOS 3.6 2.7  --   --   --   --   --   --   --    GFR: Estimated Creatinine Clearance: 21.8 mL/min (A) (by C-G formula based on SCr of 2.88 mg/dL (H)). Liver Function Tests: Recent Labs  Lab 07/21/21 0528  AST 33  ALT 53*  ALKPHOS 101  BILITOT 0.5  PROT 5.8*  ALBUMIN 1.6*   No results for input(s): LIPASE, AMYLASE in the last 168 hours. No results for input(s): AMMONIA in the last 168 hours. Coagulation Profile: No results for input(s): INR, PROTIME in the last 168 hours. Cardiac Enzymes: No results for input(s): CKTOTAL, CKMB, CKMBINDEX, TROPONINI in the last 168 hours. BNP (last 3 results) No results for input(s): PROBNP in the last 8760 hours. HbA1C: No results for input(s): HGBA1C in the last 72 hours. CBG: Recent Labs  Lab 07/24/21 2023 07/24/21 2316 07/25/21 0327 07/25/21 0756 07/25/21 1120  GLUCAP 151* 160* 173* 141* 177*   Lipid Profile: No results for input(s): CHOL, HDL, LDLCALC, TRIG, CHOLHDL, LDLDIRECT in the last 72 hours. Thyroid Function Tests: No results for input(s): TSH, T4TOTAL, FREET4, T3FREE, THYROIDAB in the last 72 hours. Anemia Panel: No results for input(s): VITAMINB12, FOLATE, FERRITIN, TIBC, IRON, RETICCTPCT in the last 72 hours. Sepsis Labs: No results for input(s): PROCALCITON, LATICACIDVEN in the last 168 hours.  Recent Results (from the past 240 hour(s))  CULTURE, BLOOD (ROUTINE X 2) w Reflex to ID Panel     Status: None   Collection Time: 07/15/21  7:57 PM   Specimen: BLOOD  Result Value Ref Range Status   Specimen Description BLOOD LEFT ASSIST CONTROL  Final   Special Requests   Final    BOTTLES DRAWN AEROBIC AND ANAEROBIC Blood Culture adequate volume   Culture   Final    NO GROWTH 5 DAYS Performed at [facility], [address] [place], [place] [address]    Report Status 07/20/2021 FINAL  Final   CULTURE, BLOOD (ROUTINE X 2) w Reflex to ID Panel     Status: None   Collection Time: 07/15/21  8:07 PM   Specimen: BLOOD  Result Value Ref Range Status   Specimen Description BLOOD LEFT FOREARM  Final   Special Requests   Final    BOTTLES DRAWN AEROBIC AND  ANAEROBIC Blood Culture adequate volume   Culture   Final    NO GROWTH 5 DAYS Performed at [facility], [address] [place], [place] [address]    Report Status 07/20/2021 FINAL  Final  Body fluid culture w Gram Stain     Status: None (Preliminary result)   Collection Time: 07/21/21  1:23 PM   Specimen: Peritoneal Washings; Body Fluid  Result Value Ref Range Status   Specimen Description   Final    PERITONEAL Performed at [facility], [address], [place], [place] [address]    Special Requests   Final    NONE Performed at [facility], [address] [place], [place] [address]    Gram Stain   Final    MODERATE WBC PRESENT,BOTH PMN AND MONONUCLEAR NO ORGANISMS SEEN Performed at [facility], [place] [address], [place], [place] [address]    Culture RARE YEAST  Final   Report Status PENDING  Incomplete  Aerobic/Anaerobic Culture w Gram Stain (surgical/deep wound)     Status: None (Preliminary result)   Collection Time: 07/21/21  3:32 PM   Specimen: Abdomen  Result Value Ref Range Status   Specimen Description   Final    ABDOMEN Performed at [facility], [address], [place], [place] [address]    Special Requests   Final    NONE Performed at [facility], [address] [place], [place] [address]    Gram Stain   Final    MODERATE SQUAMOUS EPITHELIAL CELLS PRESENT WBC PRESENT, PREDOMINANTLY PMN NO ORGANISMS SEEN    Culture   Final    RARE YEAST IDENTIFICATION TO FOLLOW Performed at [facility], [place] [address], [place],  [address]    Report Status PENDING  Incomplete         Radiology Studies: US RENAL  Result Date:  2021/08/15 CLINICAL DATA:  Sudden drop in hemoglobin.  Rule out renal hematoma. EXAM: RENAL / URINARY TRACT ULTRASOUND COMPLETE COMPARISON:  CT abdomen pelvis 07/20/2021 FINDINGS: Right Kidney: Renal measurements: 13.8 x 6.5 x 6.4 cm = volume: 301 mL. Echogenicity within normal limits. No mass or hydronephrosis visualized. 18 mm nonobstructing calculus noted in the lower pole. Left Kidney: Renal measurements: 13.0 x 6.7 x 7.0 cm = volume: 320 mL. Echogenicity within normal limits. No mass or hydronephrosis visualized. 16 mm nonobstructing calculus seen in the upper pole. 9.7 x 2.4 x 6.7 cm fluid collection noted adjacent to the left kidney. Bladder: Appears normal for degree of bladder distention. Other: None. IMPRESSION: 9.7 x 2.4 x 6.7 cm fluid collection adjacent to the left kidney most likely relates to residual abscess/phlegmon. Hyperacute hematoma could have a similar appearance. Consider further evaluation with abdominal CT. Electronically Signed   By: Miachel Roux M.D.   On: 08-15-21 14:51   DG Chest Port 1 View  Result Date: 08/15/2021 CLINICAL DATA:  Respiratory failure EXAM: PORTABLE CHEST 1 VIEW COMPARISON:  07/20/2021 FINDINGS: Gastric catheter is noted extending into the stomach. Left jugular central line is again seen and stable. Cardiac shadow is enlarged but stable. Vascular congestion is noted as well as basilar atelectasis overall unchanged from the prior exam. No pneumothorax is seen. No sizable effusion is noted. IMPRESSION: Stable bibasilar atelectasis. Mild vascular congestion is noted. Electronically Signed   By: Inez Catalina M.D.   On: 08/15/2021 15:43        Scheduled Meds:  sodium chloride   Intravenous Once  acetaminophen (TYLENOL) oral liquid 160 mg/5 mL  650 mg Per Tube Q24H   amLODipine  10 mg Per Tube Daily   chlorhexidine  15 mL Mouth Rinse BID   Chlorhexidine Gluconate Cloth  6 each Topical Q0600   cloNIDine  0.1 mg Per Tube Daily   collagenase   Topical Daily    dextrose  10 mL Intravenous Q24H   dextrose  10 mL Intravenous Q24H   [START ON 07/28/2021] epoetin (EPOGEN/PROCRIT) injection  20,000 Units Subcutaneous Weekly   free water  125 mL Per Tube Q4H   furosemide  80 mg Intravenous Q12H   heparin injection (subcutaneous)  5,000 Units Subcutaneous Q8H   insulin aspart  0-20 Units Subcutaneous Q4H   insulin aspart  6 Units Subcutaneous Q4H   insulin detemir  30 Units Subcutaneous BID   mouth rinse  15 mL Mouth Rinse q12n4p   metoprolol tartrate  100 mg Per Tube BID   multivitamin  1 tablet Per Tube QHS   nutrition supplement (JUVEN)  1 packet Per Tube BID BM   pantoprazole (PROTONIX) IV  40 mg Intravenous Q12H   sodium chloride  500 mL Intravenous Q24H   sodium chloride  500 mL Intravenous Q24H   sodium chloride flush  5 mL Intracatheter Q8H   Continuous Infusions:  sodium chloride Stopped (07/23/21 0603)   amphotericin B  Conventional (FUNGIZONE) IV 40 mg (07/24/21 2202)   feeding supplement (GLUCERNA 1.5 CAL) 1,000 mL (07/24/21 1656)   feeding supplement (GLUCERNA 1.5 CAL)       LOS: 28 days    Time spent: 34 minutes    Sharen Hones, MD [facility]   To contact the attending provider between 7A-7P or the covering provider during after hours 7P-7A, please log into the web site [email or web address] and access using universal [facility] password for that web site. If you do not have the password, please call the hospital operator.  07/25/2021, 11:55 AM

## 2021-07-25 NOTE — Progress Notes (Signed)
GI Inpatient Follow-up Note  Subjective:  Patient seen and resting comfortably.  Scheduled Inpatient Medications:   sodium chloride   Intravenous Once   acetaminophen (TYLENOL) oral liquid 160 mg/5 mL  650 mg Per Tube Q24H   amLODipine  10 mg Per Tube Daily   chlorhexidine  15 mL Mouth Rinse BID   Chlorhexidine Gluconate Cloth  6 each Topical Q0600   cloNIDine  0.1 mg Per Tube Daily   collagenase   Topical Daily   dextrose  10 mL Intravenous Q24H   dextrose  10 mL Intravenous Q24H   [START ON 07/28/2021] epoetin (EPOGEN/PROCRIT) injection  20,000 Units Subcutaneous Weekly   free water  125 mL Per Tube Q4H   furosemide  80 mg Intravenous Q12H   heparin injection (subcutaneous)  5,000 Units Subcutaneous Q8H   insulin aspart  0-20 Units Subcutaneous Q4H   insulin aspart  6 Units Subcutaneous Q4H   insulin detemir  30 Units Subcutaneous BID   mouth rinse  15 mL Mouth Rinse q12n4p   metoprolol tartrate  100 mg Per Tube BID   multivitamin  1 tablet Per Tube QHS   nutrition supplement (JUVEN)  1 packet Per Tube BID BM   pantoprazole (PROTONIX) IV  40 mg Intravenous Q12H   potassium chloride  20 mEq Per Tube Once   sodium chloride  500 mL Intravenous Q24H   sodium chloride  500 mL Intravenous Q24H   sodium chloride flush  5 mL Intracatheter Q8H    Continuous Inpatient Infusions:    sodium chloride Stopped (07/23/21 0603)   amphotericin B  Conventional (FUNGIZONE) IV 40 mg (07/24/21 2202)   [START ON 07/28/2021]  ceFAZolin (ANCEF) IV     feeding supplement (GLUCERNA 1.5 CAL) 1,000 mL (07/24/21 1656)   feeding supplement (GLUCERNA 1.5 CAL)      PRN Inpatient Medications:  sodium chloride, acetaminophen (TYLENOL) oral liquid 160 mg/5 mL, diphenhydrAMINE **OR** diphenhydrAMINE, heparin, hydrALAZINE, lidocaine (PF), lidocaine-prilocaine, meperidine (DEMEROL) injection, ondansetron (ZOFRAN) IV, oxyCODONE, pentafluoroprop-tetrafluoroeth  Review of Systems:  Unable to assess   Physical  Examination: BP (!) 157/54 (BP Location: Left Arm)   Pulse (!) 103   Temp 99.9 F (37.7 C) (Oral)   Resp (!) 21   Ht 5' 2.01" (1.575 m)   Wt 125.9 kg   SpO2 92%   BMI 50.75 kg/m  Gen: NAD HEENT: [place]ER[place] Neck: supple, no JVD or thyromegaly Chest: No respiratory distress CV: RRR Abd: soft, non-tender, non-distended Ext: trace edema Skin: no rash or lesions noted Lymph: no LAD  Data: Lab Results  Component Value Date   WBC 14.9 (H) 07/25/2021   HGB 8.1 (L) 07/25/2021   HCT 24.6 (L) 07/25/2021   MCV 87.5 07/25/2021   PLT 252 07/25/2021   Recent Labs  Lab 07/23/21 0634 07/24/21 0629 07/25/21 0616  HGB 5.8* 7.2* 8.1*   Lab Results  Component Value Date   NA 138 07/25/2021   K 3.4 (L) 07/25/2021   CL 101 07/25/2021   CO2 22 07/25/2021   BUN 70 (H) 07/25/2021   CREATININE 2.88 (H) 07/25/2021   Lab Results  Component Value Date   ALT 53 (H) 07/21/2021   AST 33 07/21/2021   ALKPHOS 101 07/21/2021   BILITOT 0.5 07/21/2021   No results for input(s): APTT, INR, PTT in the last 168 hours. Assessment/Plan: 75 y/o lady with complicated hospital course due to perforated duodenum s/p surgical repair, respiratory failure on hi-flo nasal cannula, and AKI requiring dialysis for  which we consulted for consideration of PEG and dark stool. Given high o2 requirements would not attempted endoscopically placed PEG tube at this time especially in light of recent duodenal perforation repair. The dark stool is bilious in nature and given hi O2 requirements, will recommend supportive care at this time.  Recommendations:  - follow speech recs - continue PPI IV BID - continue to trend CBC, transfuse for hemoglobin < 7. Currently hemodynamically stable   We will continue to follow, please call with any questions or concerns.   Raylene Miyamoto MD, MPH [place]

## 2021-07-25 NOTE — Consult Note (Signed)
Chief Complaint: Patient was seen in consultation today for dysphagia at the request of Sharen Hones, MD  Referring Physician(s): Sharen Hones, MD  Supervising Physician: Michaelle Birks  Patient Status: Heather Lopez - In-pt  History of Present Illness: Heather Lopez is a 75 y.o. female who presented to the ED on 8/2 with shortness of breath and respiratory distress. History is unable to be obtained from the patient today and obtained per chart review. Patient complained of acute abdominal pain, CT imaging revealed perforated duodenum s/p Ex Lap and surgical repair on 8/5. Patient with septic shock, acute respiratory failure on high flow [place], AKI, obstructive uropathy secondary to calculi s/p bilateral ureteral stents 8/10, and now requiring HD. Patient also found to have a left peri-renal abscess s/p 40F percutaneous drain catheter placed by IR 8/26. Patient now with ongoing dysphagia with current NGT in place and request received for image guided percutaneous gastrostomy tube placement in IR.  Past Medical History:  Diagnosis Date   Anxiety    Cataract    Depression    Diabetes mellitus without complication ([place])    Gout    History of kidney stones    Hyperlipidemia    Hypertension    Vertigo     Past Surgical History:  Procedure Laterality Date   CATARACT EXTRACTION, BILATERAL Bilateral 2019   COLONOSCOPY WITH PROPOFOL N/A 08/19/2015   Procedure: COLONOSCOPY WITH PROPOFOL;  Surgeon: Lucilla Lame, MD;  Location: [place];  Service: Endoscopy;  Laterality: N/A;  diabetic - insulin   CYSTOSCOPY W/ RETROGRADES Bilateral 12/22/2019   Procedure: CYSTOSCOPY WITH RETROGRADE PYELOGRAM;  Surgeon: Abbie Sons, MD;  Location: [facility];  Service: Urology;  Laterality: Bilateral;   CYSTOSCOPY W/ URETERAL STENT PLACEMENT Bilateral 07/05/2021   Procedure: CYSTOSCOPY WITH RETROGRADE PYELOGRAM/BILATERAL URETERAL STENT PLACEMENT;  Surgeon: Abbie Sons, MD;  Location: [facility];  Service:  Urology;  Laterality: Bilateral;   CYSTOSCOPY WITH BIOPSY N/A 12/22/2019   Procedure: CYSTOSCOPY WITH bladder BIOPSY;  Surgeon: Abbie Sons, MD;  Location: [facility];  Service: Urology;  Laterality: N/A;   CYSTOSCOPY WITH STENT PLACEMENT Right 12/22/2019   Procedure: CYSTOSCOPY WITH STENT PLACEMENT;  Surgeon: Abbie Sons, MD;  Location: [facility];  Service: Urology;  Laterality: Right;   CYSTOSCOPY WITH URETEROSCOPY Bilateral 12/22/2019   Procedure: CYSTOSCOPY WITH URETEROSCOPY;  Surgeon: Abbie Sons, MD;  Location: [facility];  Service: Urology;  Laterality: Bilateral;   CYSTOSCOPY/URETEROSCOPY/HOLMIUM LASER/STENT PLACEMENT Right 12/22/2019   Procedure: CYSTOSCOPY/URETEROSCOPY/HOLMIUM LASER/STENT PLACEMENT;  Surgeon: Abbie Sons, MD;  Location: [facility];  Service: Urology;  Laterality: Right;   DIALYSIS/PERMA CATHETER INSERTION N/A 06/29/2021   Procedure: DIALYSIS/PERMA CATHETER INSERTION;  Surgeon: Algernon Huxley, MD;  Location: [place] [facility];  Service: Cardiovascular;  Laterality: N/A;   DIALYSIS/PERMA CATHETER INSERTION N/A 07/13/2021   Procedure: DIALYSIS/PERMA CATHETER INSERTION;  Surgeon: Algernon Huxley, MD;  Location: [place] [facility];  Service: Cardiovascular;  Laterality: N/A;   EYE SURGERY     LAPAROTOMY N/A 06/29/2021   Procedure: EXPLORATORY LAPAROTOMY WITH REPAIR OF DUODENAL PERFORATION;  Surgeon: [name], MD;  Location: [facility];  Service: General;  Laterality: N/A;   POLYPECTOMY  08/19/2015   Procedure: POLYPECTOMY;  Surgeon: Lucilla Lame, MD;  Location: [place];  Service: Endoscopy;;   TUBAL LIGATION  1992    Allergies: Contrast media  [iodinated diagnostic agents] and Iodine  Medications: Prior to Admission medications   Medication Sig Start Date End Date Taking?  Authorizing Provider  amLODipine (NORVASC) 10 MG tablet Take 1 tablet (10 mg total) by mouth daily. 04/15/18  Yes Mikey College, NP  aspirin EC 81 MG tablet Take 81 mg by  mouth daily.   Yes [provider]  atorvastatin (LIPITOR) 20 MG tablet Take 1 tablet (20 mg total) by mouth daily. 04/15/18  Yes Mikey College, NP  benzonatate (TESSALON) 200 MG capsule Take 200 mg by mouth 3 (three) times daily as needed. 06/23/21  Yes [provider]  buPROPion (WELLBUTRIN XL) 150 MG 24 hr tablet Take 1 tablet (150 mg total) by mouth daily. 05/26/18  Yes Mikey College, NP  insulin aspart (NOVOLOG FLEXPEN) 100 UNIT/ML FlexPen Use meal coverage 6 units with every meal plus correction scale three times daily up to total of 35 units per day. 04/15/18  Yes Mikey College, NP  Insulin Pen Needle 32G X 4 MM MISC Inject insulin pens up to 5 times daily 04/15/18  Yes Mikey College, NP  Lancets (FREESTYLE) lancets USE AS INSTRUCTED 06/16/19  Yes Karamalegos, Alexander J, DO  LEVEMIR FLEXTOUCH 100 UNIT/ML Pen INJECT 50 UNITS IN THE MORNING AND 44 UNITS IN THE EVENING Patient taking differently: Inject 52 Units into the skin 2 (two) times daily. 06/30/18  Yes Mikey College, NP  levofloxacin (LEVAQUIN) 500 MG tablet Take 500 mg by mouth daily. 06/23/21  Yes [provider]  losartan (COZAAR) 50 MG tablet Take 1 tablet (50 mg total) by mouth daily. 04/15/18  Yes Mikey College, NP  metFORMIN (GLUCOPHAGE) 1000 MG tablet Take 1 tablet (1,000 mg total) by mouth 2 (two) times daily. 04/15/18  Yes Mikey College, NP  metoprolol tartrate (LOPRESSOR) 25 MG tablet Take 1 tablet (25 mg total) by mouth 2 (two) times daily. 05/26/18  Yes Mikey College, NP  Multiple Vitamin (MULTI-VITAMIN) tablet Take 1 tablet by mouth daily.    Yes [provider]  ondansetron (ZOFRAN-ODT) 4 MG disintegrating tablet Take 4 mg by mouth every 8 (eight) hours as needed. 06/23/21  Yes [provider]  predniSONE (DELTASONE) 20 MG tablet Take 20 mg by mouth 2 (two) times daily. 06/23/21  Yes [provider]  TRULICITY 3.24  MW/1.0UV SOPN SMARTSIG:0.5 Milliliter(s) SUB-Q Once a Week 06/19/21  Yes [provider]     Family History  Problem Relation Age of Onset   Diabetes Father    Cancer Father        lung cancer   Diabetes Brother    Stroke Mother     Social History   Socioeconomic History   Marital status: Married    Spouse name: Not on file   Number of children: Not on file   Years of education: Not on file   Highest education level: Not on file  Occupational History   Not on file  Tobacco Use   Smoking status: Former    Packs/day: 0.75    Years: 6.00    Pack years: 4.50    Types: Cigarettes    Quit date: 91    Years since quitting: 49.6   Smokeless tobacco: Never   Tobacco comments:    quit 1973  Vaping Use   Vaping Use: Never used  Substance and Sexual Activity   Alcohol use: No    Alcohol/week: 0.0 standard drinks   Drug use: No   Sexual activity: Not on file  Other Topics Concern   Not on file  Social History Narrative  Not on file   Social Determinants of Health   Financial Resource Strain: Not on file  Food Insecurity: Not on file  Transportation Needs: Not on file  Physical Activity: Not on file  Stress: Not on file  Social Connections: Not on file   Review of Systems: Patient unable to provide  Review of Systems  Vital Signs: BP (!) 157/54 (BP Location: Left Arm)   Pulse (!) 103   Temp 99.9 F (37.7 C) (Oral)   Resp (!) 21   Ht 5' 2.01" (1.575 m)   Wt 277 lb 9 oz (125.9 kg)   SpO2 92%   BMI 50.75 kg/m on HF [place]  Physical Exam Constitutional:      Appearance: She is ill-appearing.  HENT:     Head:     Comments: NGT intact Cardiovascular:     Rate and Rhythm: Tachycardia present.  Pulmonary:     Effort: No respiratory distress.  Abdominal:     General: There is no distension.     Palpations: Abdomen is soft. There is no mass.  Foley present yellow urine Rectal tube present   Imaging: Recent CT imaging  reviewed.  Labs:  CBC: Recent Labs    07/21/21 0528 07/23/21 0634 07/24/21 0629 07/25/21 0616  WBC 15.4* 15.2* 15.5* 14.9*  HGB 7.4* 5.8* 7.2* 8.1*  HCT 23.5* 18.6* 21.1* 24.6*  PLT 303 274 225 252    COAGS: Recent Labs    06/27/21 0032 07/16/21 2217  INR 1.4* 1.2  APTT 33 33    BMP: Recent Labs    07/22/21 0516 07/22/21 1739 07/23/21 0634 07/24/21 0629 07/24/21 1642 07/25/21 0616  NA 135  --  138 135  --  138  K 2.8*   < > 3.1* 3.1* 3.7 3.4*  CL 97*  --  100 101  --  101  CO2 28  --  25 23  --  22  GLUCOSE 154*  --  178* 96  --  156*  BUN 41*  --  60* 65*  --  70*  CALCIUM 8.5*  --  8.9 8.6*  --  8.9  CREATININE 1.83*  --  2.55* 2.78*  --  2.88*  GFRNONAA 29*  --  19* 17*  --  17*   < > = values in this interval not displayed.    LIVER FUNCTION TESTS: Recent Labs    07/15/21 1036 07/16/21 2217 07/17/21 0434 07/21/21 0528  BILITOT 0.7 0.4 0.5 0.5  AST 59* 50* 52* 33  ALT 139* 92* 88* 53*  ALKPHOS 124 108 107 101  PROT 5.8* 5.8* 6.0* 5.8*  ALBUMIN 1.7* 1.7* 1.8* 1.6*    Assessment and Plan: Shortness of breath with respiratory distress presented to ED 8/2 Abdominal pain found to have perforated duodenum s/p Ex Lap and surgical repair 8/5 Septic shock, acute respiratory failure AKI, obstructive uropathy secondary to ureteral calculi s/p bilateral ureteral stents 8/10, requiring HD- nephrology following Left peri-renal abscess s/p 55F percutaneous drain catheter placed by IR 8/26 Dysphagia with current NGT in place Request received for image guided percutaneous gastrostomy tube placement in IR CT imaging and case reviewed with my attending today and patient's anatomy is amendable to percutaneous approach Anemia, possible GI bleed with acute blood loss, getting transfusions < 7, rectal tube appears more bilious now- last transfusions 8/28 included 2 units of PRBC- will follow closely.  Plan for image guided percutaneous gastrostomy tube placement  in IR on Friday, 9/2 if patient  is stable enough to perform the procedure, will continue to follow and check labs and vitals.  Risks and benefits image guided gastrostomy tube placement was discussed with the patient's spouse today including, but not limited to need to abort the procedure, bleeding, infection, peritonitis and/or damage to adjacent structures.  All of the patient's questions were answered, patient is agreeable to proceed.  Consent signed and in chart.  Thank you for this interesting consult.  I greatly enjoyed meeting CASSITY CHRISTIAN and look forward to participating in their care.  A copy of this report was sent to the requesting provider on this date.  Electronically Signed: Hedy Jacob, PA-C 07/25/2021, 4:13 PM   I spent a total of 40 Minutes in face to face in clinical consultation, greater than 50% of which was counseling/coordinating care for dysphagia.

## 2021-07-26 DIAGNOSIS — D638 Anemia in other chronic diseases classified elsewhere: Secondary | ICD-10-CM

## 2021-07-26 DIAGNOSIS — N151 Renal and perinephric abscess: Secondary | ICD-10-CM | POA: Diagnosis not present

## 2021-07-26 LAB — BASIC METABOLIC PANEL
Anion gap: 13 (ref 5–15)
BUN: 78 mg/dL — ABNORMAL HIGH (ref 8–23)
CO2: 24 mmol/L (ref 22–32)
Calcium: 9 mg/dL (ref 8.9–10.3)
Chloride: 106 mmol/L (ref 98–111)
Creatinine, Ser: 2.64 mg/dL — ABNORMAL HIGH (ref 0.44–1.00)
GFR, Estimated: 18 mL/min — ABNORMAL LOW (ref >60)
Glucose, Bld: 114 mg/dL — ABNORMAL HIGH (ref 70–99)
Potassium: 3.3 mmol/L — ABNORMAL LOW (ref 3.5–5.1)
Sodium: 143 mmol/L (ref 135–145)

## 2021-07-26 LAB — AEROBIC/ANAEROBIC CULTURE W GRAM STAIN (SURGICAL/DEEP WOUND)

## 2021-07-26 LAB — CBC WITH DIFFERENTIAL/PLATELET
Abs Immature Granulocytes: 0.39 10*3/uL — ABNORMAL HIGH (ref 0.00–0.07)
Basophils Absolute: 0 10*3/uL (ref 0.0–0.1)
Basophils Relative: 0 %
Eosinophils Absolute: 0.1 10*3/uL (ref 0.0–0.5)
Eosinophils Relative: 1 %
HCT: 21.4 % — ABNORMAL LOW (ref 36.0–46.0)
Hemoglobin: 6.9 g/dL — ABNORMAL LOW (ref 12.0–15.0)
Immature Granulocytes: 3 %
Lymphocytes Relative: 8 %
Lymphs Abs: 1.1 10*3/uL (ref 0.7–4.0)
MCH: 29 pg (ref 26.0–34.0)
MCHC: 32.2 g/dL (ref 30.0–36.0)
MCV: 89.9 fL (ref 80.0–100.0)
Monocytes Absolute: 0.8 10*3/uL (ref 0.1–1.0)
Monocytes Relative: 6 %
Neutro Abs: 11.9 10*3/uL — ABNORMAL HIGH (ref 1.7–7.7)
Neutrophils Relative %: 82 %
Platelets: 247 10*3/uL (ref 150–400)
RBC: 2.38 MIL/uL — ABNORMAL LOW (ref 3.87–5.11)
RDW: 16.9 % — ABNORMAL HIGH (ref 11.5–15.5)
WBC: 14.4 10*3/uL — ABNORMAL HIGH (ref 4.0–10.5)
nRBC: 0.2 % (ref 0.0–0.2)

## 2021-07-26 LAB — PREPARE RBC (CROSSMATCH)

## 2021-07-26 LAB — GLUCOSE, CAPILLARY
Glucose-Capillary: 109 mg/dL — ABNORMAL HIGH (ref 70–99)
Glucose-Capillary: 115 mg/dL — ABNORMAL HIGH (ref 70–99)
Glucose-Capillary: 137 mg/dL — ABNORMAL HIGH (ref 70–99)
Glucose-Capillary: 150 mg/dL — ABNORMAL HIGH (ref 70–99)
Glucose-Capillary: 185 mg/dL — ABNORMAL HIGH (ref 70–99)
Glucose-Capillary: 196 mg/dL — ABNORMAL HIGH (ref 70–99)

## 2021-07-26 LAB — TYPE AND SCREEN
ABO/RH(D): O POS
Antibody Screen: NEGATIVE

## 2021-07-26 LAB — MAGNESIUM: Magnesium: 2.1 mg/dL (ref 1.7–2.4)

## 2021-07-26 LAB — FUNGITELL, SERUM

## 2021-07-26 LAB — PHOSPHORUS: Phosphorus: 5 mg/dL — ABNORMAL HIGH (ref 2.5–4.6)

## 2021-07-26 MED ORDER — POTASSIUM CHLORIDE 10 MEQ/100ML IV SOLN
10.0000 meq | INTRAVENOUS | Status: AC
  Administered 2021-07-26 (×4): 10 meq via INTRAVENOUS
  Filled 2021-07-26 (×4): qty 100

## 2021-07-26 MED ORDER — ACETAMINOPHEN 325 MG PO TABS
650.0000 mg | ORAL_TABLET | Freq: Once | ORAL | Status: AC
  Administered 2021-07-26: 650 mg
  Filled 2021-07-26: qty 2

## 2021-07-26 MED ORDER — SODIUM CHLORIDE 0.9% IV SOLUTION: Freq: Once | INTRAVENOUS | Status: DC

## 2021-07-26 NOTE — Progress Notes (Signed)
GI Inpatient Follow-up Note  Subjective:  Patient seen and resting comfortably. Discussed situation with husband and he noted she is much more alert. Patient hemoglobin drifted slightly down to 6.9.  Scheduled Inpatient Medications:   sodium chloride   Intravenous Once   acetaminophen (TYLENOL) oral liquid 160 mg/5 mL  650 mg Per Tube Q24H   amLODipine  10 mg Per Tube Daily   chlorhexidine  15 mL Mouth Rinse BID   Chlorhexidine Gluconate Cloth  6 each Topical Q0600   cloNIDine  0.1 mg Per Tube Daily   collagenase   Topical Daily   dextrose  10 mL Intravenous Q24H   dextrose  10 mL Intravenous Q24H   [START ON 07/28/2021] epoetin (EPOGEN/PROCRIT) injection  20,000 Units Subcutaneous Weekly   free water  125 mL Per Tube Q4H   furosemide  80 mg Intravenous Q12H   heparin injection (subcutaneous)  5,000 Units Subcutaneous Q8H   insulin aspart  0-20 Units Subcutaneous Q4H   insulin aspart  6 Units Subcutaneous Q4H   insulin detemir  30 Units Subcutaneous BID   mouth rinse  15 mL Mouth Rinse q12n4p   metoprolol tartrate  100 mg Per Tube BID   multivitamin  1 tablet Per Tube QHS   nutrition supplement (JUVEN)  1 packet Per Tube BID BM   pantoprazole (PROTONIX) IV  40 mg Intravenous Q12H   sodium chloride  500 mL Intravenous Q24H   sodium chloride  500 mL Intravenous Q24H   sodium chloride flush  5 mL Intracatheter Q8H    Continuous Inpatient Infusions:    sodium chloride Stopped (07/23/21 0603)   amphotericin B  Conventional (FUNGIZONE) IV Stopped (07/26/21 0156)   [START ON 07/28/2021]  ceFAZolin (ANCEF) IV     feeding supplement (GLUCERNA 1.5 CAL) 1,000 mL (07/24/21 1656)   feeding supplement (GLUCERNA 1.5 CAL)      PRN Inpatient Medications:  sodium chloride, acetaminophen (TYLENOL) oral liquid 160 mg/5 mL, diphenhydrAMINE **OR** diphenhydrAMINE, heparin, hydrALAZINE, lidocaine (PF), lidocaine-prilocaine, meperidine (DEMEROL) injection, ondansetron (ZOFRAN) IV, oxyCODONE,  pentafluoroprop-tetrafluoroeth  Review of Systems:  Unable to assess   Physical Examination: BP (!) 133/53   Pulse 90   Temp 99.4 F (37.4 C) (Oral)   Resp (!) 26   Ht 5' 2.01" (1.575 m)   Wt 125.9 kg   SpO2 95%   BMI 50.75 kg/m  Gen: NAD HEENT: PEERLA Neck: supple, no JVD or thyromegaly Chest: No respiratory distress CV: RRR Abd: soft, non-tender, non-distended Ext: trace edema Skin: no rash or lesions noted Lymph: no LAD  Data: Lab Results  Component Value Date   WBC 14.4 (H) 07/26/2021   HGB 6.9 (L) 07/26/2021   HCT 21.4 (L) 07/26/2021   MCV 89.9 07/26/2021   PLT 247 07/26/2021   Recent Labs  Lab 07/24/21 0629 07/25/21 0616 07/26/21 0618  HGB 7.2* 8.1* 6.9*   Lab Results  Component Value Date   NA 143 07/26/2021   K 3.3 (L) 07/26/2021   CL 106 07/26/2021   CO2 24 07/26/2021   BUN 78 (H) 07/26/2021   CREATININE 2.64 (H) 07/26/2021   Lab Results  Component Value Date   ALT 53 (H) 07/21/2021   AST 33 07/21/2021   ALKPHOS 101 07/21/2021   BILITOT 0.5 07/21/2021   No results for input(s): APTT, INR, PTT in the last 168 hours. Assessment/Plan: 75 y/o lady with complicated hospital course due to perforated duodenum s/p surgical repair, respiratory failure on hi-flo nasal cannula, and AKI  requiring dialysis for which we consulted for consideration of PEG and dark stool. Given high o2 requirements would not attempted endoscopically placed PEG tube at this time especially in light of recent duodenal perforation repair. The dark stool is bilious in nature and given hi O2 requirements, will recommend supportive care at this time.  Recommendations:  - follow speech recs - continue PPI IV BID - recommend blood transfusion today - continue to trend CBC, transfuse for hemoglobin < 7. Currently hemodynamically stable   We will continue to follow, please call with any questions or concerns.   Raylene Miyamoto MD, MPH [place]

## 2021-07-26 NOTE — Progress Notes (Signed)
PT Cancellation Note  Patient Details Name: ALEXANDRYA CHIM MRN: [id number] DOB: [birth year]   Cancelled Treatment:    Reason Eval/Treat Not Completed: Other (comment). Pt with low Hgb at 6.9 this date. Also pending PEG tube placement. Will hold treatment at this time and re-attempt next available date.   Catheleen Langhorne 07/26/2021, 9:47 AM Greggory Stallion, PT, DPT [phone]

## 2021-07-26 NOTE — Progress Notes (Signed)
PROGRESS NOTE    Heather Lopez  PQD:[id number] DOB: [birth year] DOA: 06/27/2021 PCP: [name], PA-C   Chief complaint.  Shortness of breath. Brief Narrative:   Heather Lopez is a 75 y.o. female with history of hypertension, diabetes mellitus, hyperlipidemia has been feeling weak for the last 3 to 4 days.  Patient had a acute renal failure with creatinine of 9.3, potassium 7.5, acute metabolic acidosis with pH 7.15, procalcitonin 6.83.  Patient was diagnosed was septic shock, acute respiratory failure. On night of 8/4, patient developed acute abdominal pain, CT scan showed acute abdomen secondary to perforated duodenum.  Patient was placed on mechanical ventilation after duodenum repair. Patient had a bilateral hydronephrosis, ureteral stents was placed on 8/10. Patient urine culture was positive for Candida glabrata.  ID consult was obtained, patient currently on high-dose of Diflucan. 8/26. Due to persistent fever, CT scan of abdomen/pelvis was repeated on 8/26, aspiration pararenal fluids was obtained, removed 5 mL of pus.  ID changed antibiotic to Amphotericin in addition to Zosyn  8/31-Hg 6.9. discussed with pt and later with husband about transfusion, risk/complications,  and they would like to proceed.  Assessment & Plan:   Active Problems:   Uncontrolled type 2 diabetes mellitus with insulin therapy ([place])   Hyperlipidemia associated with type 2 diabetes mellitus ([name])   Type 2 diabetes mellitus with stage 4 chronic kidney disease, with long-term current use of insulin (HCC)   Acute respiratory failure with hypoxia (HCC)   Severe sepsis with septic shock (HCC)   Acute lower UTI   Acute kidney injury superimposed on chronic kidney disease (HCC)   Pneumoperitoneum   Pressure injury of skin   Atrial flutter with rapid ventricular response (HCC)   Endotracheally intubated   Bilateral nephrolithiasis   Anemia of chronic disease   Demand ischemia of myocardium (HCC)   Lethargy    Duodenal ulcer perforation ([name])   Renal abscess   Essential hypertension   Hypokalemia  Acute metabolic encephalopathy. Sepsis with septic shock secondary to obstructive uropathy, kidney stone.  Status post bilateral ureteral stents. Candida urinary tract infection Perirenal abscess. Perforated duodenum. S/p repair. Pararenal drain by IR only removed 5 mL, cultures so far has no growth.  Repeat ultrasound still has significant pararenal abscess. This thought to be secondary to fungal infection with Candida glabrata.  Patient is on Amphotericin per ID. Patient also s/p bilateral ureteral stents.  Last CT scan showed persistent bilateral hydronephrosis, urology reconsulted, believe this is due to urinary retention.  Foley catheter was anchored. As for patient duodenal perforation, patient had surgical repair, condition had 8/31 MS improving.  Tolerating TF  Acute hypoxemic respiratory failure secondary to septic shock. Morbid obesity and obesity hypoventilation syndrome  Patient has persistent hypoxemia, she had increased oxygen requirement yesterday, she was using 90% of heated high flow.  She was started on IV Lasix, oxygen was down to 40%.  She most likely has acute on chronic diastolic congestive heart failure.  Her echo on 06/27/2021 showed ejection fraction 50 to 55%. 8/31 continue iv lasix  Acute kidney injury on chronic kidney disease stage IV. Hypokalemia. Patient is followed by nephrology, initially dialyzed, she has not been requiring hemodialysis for the last few days. 8/31- will replace K as it is 3.3 Monitor levels  Iron deficiency anemia Likely acute blood loss anemia. Possible GI bleed. Patient has been transfused, also received IV iron.  No B12 deficiency.  She has  black stool from the rectal tube.  GI consult is obtained.  Continued on Protonix IV twice a day. GI is not considering EGD due to high risk of procedure and  patient has severe hypoxemia. 8/31 will  transfuse 1 unit prbc today Monitor cbc   Uncontrolled type 2 diabetes with hyperglycemia BG stable Continue RISS   Dysphagia. Patient has significant dysphagia, has not been able to take any p.o.  Currently receiving tube feeding through NG tube.  Discussed with GI, family still request a PEG tube placement.  However, due to hypoxemia, the procedure was deemed to be high risk. Spoke with IR, PEG tube tomorrow.  DVT prophylaxis: Heparin Code Status: DNR Family Communication: I had a several conversation with the family over the course of the week, patient prognosis guarded.  Long-term prognosis is quite poor.  But family is not ready for comfort care. Disposition Plan:    Status is: Inpatient  Remains inpatient appropriate because:IV treatments appropriate due to intensity of illness or inability to take PO and Inpatient level of care appropriate due to severity of illness  Dispo: The patient is from: Home              Anticipated d/c is to: LTAC              Patient currently is not medically stable to d/c.   Difficult to place patient No  Patient has been accepted to [facility] in [place].  Getting transfusion.      I/O last 3 completed shifts: In: 5621 [I.V.:230; HY/QM:5784; IV ONGEXBMWU:1324] Out: 4010 [Urine:4650; Drains:20; UVOZD:6644] Total I/O In: 125 [NG/GT:125] Out: 46 [Urine:1400]     Consultants:  Nephrology, ID  Procedures: Duodenum, bilateral ureteral stents.  Antimicrobials:  Amphotericin Subjective: Pt with TF in place. Denies abd pain. Tells me "do everything I dont want to die".   Objective: Vitals:   07/26/21 0400 07/26/21 0415 07/26/21 0430 07/26/21 0814  BP: (!) 157/58     Pulse: 88 89 89   Resp: (!) 22 (!) 22 (!) 22   Temp: 98.7 F (37.1 C)   99.4 F (37.4 C)  TempSrc: Axillary   Oral  SpO2: 100% 100% 100% 99%  Weight:      Height:        Intake/Output Summary (Last 24 hours) at 07/26/2021 0948 Last data filed at 07/26/2021  0827 Gross per 24 hour  Intake 2215.02 ml  Output 5960 ml  Net -3744.98 ml   Filed Weights   07/17/21 0500 07/18/21 0500 07/22/21 0500  Weight: 125.5 kg 125.9 kg 125.9 kg    Examination:  Nad, calm Crackles/decrease bs at bases Regular s1 s2 no gallop Soft distended, +bs +edema Awake and alert Mood and affect appropriate in current setting    Data Reviewed: I have personally reviewed following labs and imaging studies  CBC: Recent Labs  Lab 07/21/21 0528 07/23/21 0634 07/24/21 0629 07/25/21 0616 07/26/21 0618  WBC 15.4* 15.2* 15.5* 14.9* 14.4*  NEUTROABS 13.2* 11.3* 12.4* 12.2* 11.9*  HGB 7.4* 5.8* 7.2* 8.1* 6.9*  HCT 23.5* 18.6* 21.1* 24.6* 21.4*  MCV 85.8 87.3 86.8 87.5 89.9  PLT 303 274 225 252 034   Basic Metabolic Panel: Recent Labs  Lab 07/20/21 0601 07/21/21 0528 07/22/21 0516 07/22/21 1739 07/23/21 0634 07/24/21 0629 07/24/21 1642 07/25/21 0616 07/26/21 0618  NA 136   < > 135  --  138 135  --  138 143  K 4.1   < > 2.8* 3.0* 3.1* 3.1* 3.7 3.4* 3.3*  CL  99   < > 97*  --  100 101  --  101 106  CO2 25   < > 28  --  25 23  --  22 24  GLUCOSE 190*   < > 154*  --  178* 96  --  156* 114*  BUN 55*   < > 41*  --  60* 65*  --  70* 78*  CREATININE 2.04*   < > 1.83*  --  2.55* 2.78*  --  2.88* 2.64*  CALCIUM 8.9   < > 8.5*  --  8.9 8.6*  --  8.9 9.0  MG 1.9   < > 1.8 2.0 2.2 1.9  --  2.0 2.1  PHOS 2.7  --   --   --   --   --   --   --  5.0*   < > = values in this interval not displayed.   GFR: Estimated Creatinine Clearance: 23.7 mL/min (A) (by C-G formula based on SCr of 2.64 mg/dL (H)). Liver Function Tests: Recent Labs  Lab 07/21/21 0528  AST 33  ALT 53*  ALKPHOS 101  BILITOT 0.5  PROT 5.8*  ALBUMIN 1.6*   No results for input(s): LIPASE, AMYLASE in the last 168 hours. No results for input(s): AMMONIA in the last 168 hours. Coagulation Profile: No results for input(s): INR, PROTIME in the last 168 hours. Cardiac Enzymes: No results for  input(s): CKTOTAL, CKMB, CKMBINDEX, TROPONINI in the last 168 hours. BNP (last 3 results) No results for input(s): PROBNP in the last 8760 hours. HbA1C: No results for input(s): HGBA1C in the last 72 hours. CBG: Recent Labs  Lab 07/25/21 1550 07/25/21 2014 07/25/21 2357 07/26/21 0415 07/26/21 0815  GLUCAP 195* 196* 168* 109* 115*   Lipid Profile: No results for input(s): CHOL, HDL, LDLCALC, TRIG, CHOLHDL, LDLDIRECT in the last 72 hours. Thyroid Function Tests: No results for input(s): TSH, T4TOTAL, FREET4, T3FREE, THYROIDAB in the last 72 hours. Anemia Panel: No results for input(s): VITAMINB12, FOLATE, FERRITIN, TIBC, IRON, RETICCTPCT in the last 72 hours. Sepsis Labs: No results for input(s): PROCALCITON, LATICACIDVEN in the last 168 hours.  Recent Results (from the past 240 hour(s))  Body fluid culture w Gram Stain     Status: None (Preliminary result)   Collection Time: 07/21/21  1:23 PM   Specimen: Peritoneal Washings; Body Fluid  Result Value Ref Range Status   Specimen Description   Final    PERITONEAL Performed at [facility], [address], [place], [place] [address]    Special Requests   Final    NONE Performed at [facility], [address] [place], [place] [address]    Gram Stain   Final    MODERATE WBC PRESENT,BOTH PMN AND MONONUCLEAR NO ORGANISMS SEEN Performed at [facility], [place] [address] [place], [place] [address]    Culture RARE YEAST  Final   Report Status PENDING  Incomplete  Aerobic/Anaerobic Culture w Gram Stain (surgical/deep wound)     Status: None (Preliminary result)   Collection Time: 07/21/21  3:32 PM   Specimen: Abdomen  Result Value Ref Range Status   Specimen Description   Final    ABDOMEN Performed at [facility], [address] [place], [place] [address]    Special Requests   Final    NONE Performed at [facility], [address], [place], [place] [address]    Gram Stain    Final  MODERATE SQUAMOUS EPITHELIAL CELLS PRESENT WBC PRESENT, PREDOMINANTLY PMN NO ORGANISMS SEEN    Culture   Final    RARE YEAST IDENTIFICATION TO FOLLOW Performed at [facility], [place] [address], [place], [place] [address]    Report Status PENDING  Incomplete         Radiology Studies: US RENAL  Result Date: 07/24/2021 CLINICAL DATA:  Sudden drop in hemoglobin.  Rule out renal hematoma. EXAM: RENAL / URINARY TRACT ULTRASOUND COMPLETE COMPARISON:  CT abdomen pelvis 07/20/2021 FINDINGS: Right Kidney: Renal measurements: 13.8 x 6.5 x 6.4 cm = volume: 301 mL. Echogenicity within normal limits. No mass or hydronephrosis visualized. 18 mm nonobstructing calculus noted in the lower pole. Left Kidney: Renal measurements: 13.0 x 6.7 x 7.0 cm = volume: 320 mL. Echogenicity within normal limits. No mass or hydronephrosis visualized. 16 mm nonobstructing calculus seen in the upper pole. 9.7 x 2.4 x 6.7 cm fluid collection noted adjacent to the left kidney. Bladder: Appears normal for degree of bladder distention. Other: None. IMPRESSION: 9.7 x 2.4 x 6.7 cm fluid collection adjacent to the left kidney most likely relates to residual abscess/phlegmon. Hyperacute hematoma could have a similar appearance. Consider further evaluation with abdominal CT. Electronically Signed   By: Miachel Roux M.D.   On: 07/24/2021 14:51   DG Chest Port 1 View  Result Date: 07/24/2021 CLINICAL DATA:  Respiratory failure EXAM: PORTABLE CHEST 1 VIEW COMPARISON:  07/20/2021 FINDINGS: Gastric catheter is noted extending into the stomach. Left jugular central line is again seen and stable. Cardiac shadow is enlarged but stable. Vascular congestion is noted as well as basilar atelectasis overall unchanged from the prior exam. No pneumothorax is seen. No sizable effusion is noted. IMPRESSION: Stable bibasilar atelectasis. Mild vascular congestion is noted. Electronically Signed   By: Inez Catalina M.D.   On: 07/24/2021  15:43        Scheduled Meds:  sodium chloride   Intravenous Once   acetaminophen (TYLENOL) oral liquid 160 mg/5 mL  650 mg Per Tube Q24H   amLODipine  10 mg Per Tube Daily   chlorhexidine  15 mL Mouth Rinse BID   Chlorhexidine Gluconate Cloth  6 each Topical Q0600   cloNIDine  0.1 mg Per Tube Daily   collagenase   Topical Daily   dextrose  10 mL Intravenous Q24H   dextrose  10 mL Intravenous Q24H   [START ON 07/28/2021] epoetin (EPOGEN/PROCRIT) injection  20,000 Units Subcutaneous Weekly   free water  125 mL Per Tube Q4H   furosemide  80 mg Intravenous Q12H   heparin injection (subcutaneous)  5,000 Units Subcutaneous Q8H   insulin aspart  0-20 Units Subcutaneous Q4H   insulin aspart  6 Units Subcutaneous Q4H   insulin detemir  30 Units Subcutaneous BID   mouth rinse  15 mL Mouth Rinse q12n4p   metoprolol tartrate  100 mg Per Tube BID   multivitamin  1 tablet Per Tube QHS   nutrition supplement (JUVEN)  1 packet Per Tube BID BM   pantoprazole (PROTONIX) IV  40 mg Intravenous Q12H   sodium chloride  500 mL Intravenous Q24H   sodium chloride  500 mL Intravenous Q24H   sodium chloride flush  5 mL Intracatheter Q8H   Continuous Infusions:  sodium chloride Stopped (07/23/21 0603)   amphotericin B  Conventional (FUNGIZONE) IV Stopped (07/26/21 0156)   [START ON 07/28/2021]  ceFAZolin (ANCEF) IV     feeding supplement (GLUCERNA 1.5 CAL) 1,000 mL (  07/24/21 1656)   feeding supplement (GLUCERNA 1.5 CAL)       LOS: 29 days    Time spent: 35 minutes with >50% on coc    Nolberto Hanlon, MD [facility]   To contact the attending provider between 7A-7P or the covering provider during after hours 7P-7A, please log into the web site [email or web address] and access using universal  password for that web site. If you do not have the password, please call the hospital operator.  07/26/2021, 9:48 AM

## 2021-07-26 NOTE — Progress Notes (Addendum)
Date of Admission:  06/27/2021    ID: Heather Lopez is a 75 y.o. female Active Problems:   Uncontrolled type 2 diabetes mellitus with insulin therapy ([place])   Hyperlipidemia associated with type 2 diabetes mellitus ([place])   Type 2 diabetes mellitus with stage 4 chronic kidney disease, with long-term current use of insulin (HCC)   Acute respiratory failure with hypoxia (HCC)   Severe sepsis with septic shock (HCC)   Acute lower UTI   Acute kidney injury superimposed on chronic kidney disease (HCC)   Pneumoperitoneum   Pressure injury of skin   Atrial flutter with rapid ventricular response (HCC)   Endotracheally intubated   Bilateral nephrolithiasis   Anemia of chronic disease   Demand ischemia of myocardium (HCC)   Lethargy   Duodenal ulcer perforation (HCC)   Renal abscess   Essential hypertension   Hypokalemia    Subjective: Pt is somnolent Not much verbal  Medications:   sodium chloride   Intravenous Once   sodium chloride   Intravenous Once   acetaminophen (TYLENOL) oral liquid 160 mg/5 mL  650 mg Per Tube Q24H   amLODipine  10 mg Per Tube Daily   chlorhexidine  15 mL Mouth Rinse BID   Chlorhexidine Gluconate Cloth  6 each Topical Q0600   cloNIDine  0.1 mg Per Tube Daily   collagenase   Topical Daily   dextrose  10 mL Intravenous Q24H   dextrose  10 mL Intravenous Q24H   [START ON 07/28/2021] epoetin (EPOGEN/PROCRIT) injection  20,000 Units Subcutaneous Weekly   free water  125 mL Per Tube Q4H   furosemide  80 mg Intravenous Q12H   heparin injection (subcutaneous)  5,000 Units Subcutaneous Q8H   insulin aspart  0-20 Units Subcutaneous Q4H   insulin aspart  6 Units Subcutaneous Q4H   insulin detemir  30 Units Subcutaneous BID   mouth rinse  15 mL Mouth Rinse q12n4p   metoprolol tartrate  100 mg Per Tube BID   multivitamin  1 tablet Per Tube QHS   nutrition supplement (JUVEN)  1 packet Per Tube BID BM   pantoprazole (PROTONIX) IV  40 mg Intravenous Q12H   sodium  chloride  500 mL Intravenous Q24H   sodium chloride  500 mL Intravenous Q24H   sodium chloride flush  5 mL Intracatheter Q8H    Objective: Vital signs in last 24 hours: Temp:  [98.7 F (37.1 C)-100 F (37.8 C)] 99.4 F (37.4 C) (08/31 0814) Pulse Rate:  [80-106] 96 (08/31 1700) Resp:  [15-40] 26 (08/31 1700) BP: (118-158)/(45-64) 157/53 (08/31 1700) SpO2:  [92 %-100 %] 100 % (08/31 1700) FiO2 (%):  [75 %-94 %] 75 % (08/31 1700)  PHYSICAL EXAM:  General: somnolent, but more awake than before-  NG tube Lungs: b/la ir entry- decreased bases Heart: irregular Abdomen: Soft, lap surgical site clean, healed well Left sided abd drain Foley . Bowel sounds normal. No masses Extremities: atraumatic, no cyanosis. No edema. No clubbing Skin: No rashes or lesions. Or bruising Lymph: Cervical, supraclavicular normal. Neurologic: Grossly non-focal  Lab Results Recent Labs    07/25/21 0616 07/26/21 0618  WBC 14.9* 14.4*  HGB 8.1* 6.9*  HCT 24.6* 21.4*  NA 138 143  K 3.4* 3.3*  CL 101 106  CO2 22 24  BUN 70* 78*  CREATININE 2.88* 2.64*    Assessment/Plan:  Duodenal perforation status post laparotomy and closure Stable  Complicated UTI with left perirenal/renal abscess secondary to bilateral nephrolithiasis hydronephrosis  bilateral kidney.  Candida glabrata in the culture taken from the kidney in urine.  Patient initially was on fluconazole but because of worsening renal abscess changed to amphotericin B.  She now has a drain in the left kidney. Culture from the abscess is Candida glabrata.   AKI on CKD-intermittent dialysis Acute hypoxia- on hi flo oxygen ---remains extubated Fluctuating encephaolpathy  Discussed the management with her nurse

## 2021-07-26 NOTE — Progress Notes (Signed)
SLP Cancellation Note  Patient Details Name: Heather Lopez MRN: [id number] DOB: [birth year]   Cancelled treatment:       Reason Eval/Treat Not Completed: Patient not medically ready;Other (comment) (chart reviewed; consulted pt/husband in room) Pt is enjoying single ice chips w/ Husband at bedside feeding her. Re-iterated general aspiration precautions during ice chips as posted. Discussed plan for PEG placed by IR on Friday and removal of NGT.  Recommend continue w/ current therapeutic ice chips, aspiration precautions. Will f/u w/ pt post PEG placement w/ trials of least restrictive diet consistency. NSG updated.       Orinda Kenner, MS, CCC-SLP Speech Language Pathologist Rehab Services [phone] [facility] 07/26/2021, 11:35 AM

## 2021-07-26 NOTE — Progress Notes (Signed)
[facility]  ROUNDING NOTE   Subjective:   Heather Lopez is a 75 y.o. white female who was admitted for community acquired pneumonia on 06/27/2021     Hospital course complicated by duodenal perforation s/p repair on 06/30/2021 Left obstructive hydronephrosis and renal abscess, Patient underwent cystoscopy and b/l ureteral stent placement 8/10 8/26- CT guided aspiration of perirenal abscess- 5 ml purulent fluid aspirated Cultures neg so far  UOP has improved with foley placement Lab Results  Component Value Date   CREATININE 2.64 (H) 07/26/2021   CREATININE 2.88 (H) 07/25/2021   CREATININE 2.78 (H) 07/24/2021   08/30 0701 - 08/31 0700 In: 2355 [I.V.:210; [place]/HC:6237; IV Piggyback:500] Out: 4560 [Urine:3200; Drains:10; Stool:1350]   Continues to have low-grade fever of 99 Tube feeds Nepro 60 cc/h via NG tube  Nasal cannula 50 L/min oxygen supplementation BiPAP at night Mouth appears dry- able to eat ice chips.  More alert today and following simple commands Husband at bedside Requiring HFNC O2 ; 75%   Objective:  Vital signs in last 24 hours:  Temp:  [98.7 F (37.1 C)-100 F (37.8 C)] 99.4 F (37.4 C) (08/31 0814) Pulse Rate:  [82-106] 89 (08/31 0430) Resp:  [15-40] 22 (08/31 0430) BP: (118-164)/(45-64) 157/58 (08/31 0400) SpO2:  [83 %-100 %] 99 % (08/31 0814) FiO2 (%):  [75 %-95 %] 75 % (08/31 0814)  Weight change:  Filed Weights   07/17/21 0500 07/18/21 0500 07/22/21 0500  Weight: 125.5 kg 125.9 kg 125.9 kg    Intake/Output: I/O last 3 completed shifts: In: 6859 [I.V.:230; SE/GB:1517; IV Piggyback:2500] Out: 6160 [Urine:4650; Drains:20; VPXTG:6269]   Intake/Output this shift:  Total I/O In: 125 [NG/GT:125] Out: 1400 [Urine:1400]  Physical Exam: General: Critically ill  Head: HFNC O2, NGT in place  Eyes: Anicteric  Lungs:  +coarse breath sounds  Heart: tachycardia  Abdomen:  midline incision  Extremities:  + peripheral edema   Neurologic:   Following commands  GU Foley with yellow urine  Access:  LIJ permcath 8/18  Rectal tube  Basic Metabolic Panel: Recent Labs  Lab 07/20/21 0601 07/21/21 0528 07/22/21 0516 07/22/21 1739 07/23/21 0634 07/24/21 0629 07/24/21 1642 07/25/21 0616 07/26/21 0618  NA 136   < > 135  --  138 135  --  138 143  K 4.1   < > 2.8* 3.0* 3.1* 3.1* 3.7 3.4* 3.3*  CL 99   < > 97*  --  100 101  --  101 106  CO2 25   < > 28  --  25 23  --  22 24  GLUCOSE 190*   < > 154*  --  178* 96  --  156* 114*  BUN 55*   < > 41*  --  60* 65*  --  70* 78*  CREATININE 2.04*   < > 1.83*  --  2.55* 2.78*  --  2.88* 2.64*  CALCIUM 8.9   < > 8.5*  --  8.9 8.6*  --  8.9 9.0  MG 1.9   < > 1.8 2.0 2.2 1.9  --  2.0 2.1  PHOS 2.7  --   --   --   --   --   --   --  5.0*   < > = values in this interval not displayed.     Liver Function Tests: Recent Labs  Lab 07/21/21 0528  AST 33  ALT 53*  ALKPHOS 101  BILITOT 0.5  PROT 5.8*  ALBUMIN 1.6*  No results for input(s): LIPASE, AMYLASE in the last 168 hours. No results for input(s): AMMONIA in the last 168 hours.  CBC: Recent Labs  Lab 07/21/21 0528 07/23/21 0634 07/24/21 0629 07/25/21 0616 07/26/21 0618  WBC 15.4* 15.2* 15.5* 14.9* 14.4*  NEUTROABS 13.2* 11.3* 12.4* 12.2* 11.9*  HGB 7.4* 5.8* 7.2* 8.1* 6.9*  HCT 23.5* 18.6* 21.1* 24.6* 21.4*  MCV 85.8 87.3 86.8 87.5 89.9  PLT 303 274 225 252 247     Cardiac Enzymes: No results for input(s): CKTOTAL, CKMB, CKMBINDEX, TROPONINI in the last 168 hours.  BNP: Invalid input(s): POCBNP  CBG: Recent Labs  Lab 07/25/21 1550 07/25/21 2014 07/25/21 2357 07/26/21 0415 07/26/21 0815  GLUCAP 195* 196* 168* 109* 115*     Microbiology: Results for orders placed or performed during the hospital encounter of 06/27/21  Resp Panel by RT-PCR (Flu A&B, Covid) Nasopharyngeal Swab     Status: None   Collection Time: 06/27/21 12:32 AM   Specimen: Nasopharyngeal Swab; Nasopharyngeal(NP) swabs in vial  transport medium  Result Value Ref Range Status   SARS Coronavirus 2 by RT PCR NEGATIVE NEGATIVE Final    Comment: (NOTE) SARS-CoV-2 target nucleic acids are NOT DETECTED.  The SARS-CoV-2 RNA is generally detectable in upper respiratory specimens during the acute phase of infection. The lowest concentration of SARS-CoV-2 viral copies this assay can detect is 138 copies/mL. A negative result does not preclude SARS-Cov-2 infection and should not be used as the sole basis for treatment or other patient management decisions. A negative result may occur with  improper specimen collection/handling, submission of specimen other than nasopharyngeal swab, presence of viral mutation(s) within the areas targeted by this assay, and inadequate number of viral copies(<138 copies/mL). A negative result must be combined with clinical observations, patient history, and epidemiological information. The expected result is Negative.  Fact Sheet for Patients:  EntrepreneurPulse.com.au  Fact Sheet for Healthcare Providers:  IncredibleEmployment.be  This test is no t yet approved or cleared by the [place] FDA and  has been authorized for detection and/or diagnosis of SARS-CoV-2 by FDA under an Emergency Use Authorization (EUA). This EUA will remain  in effect (meaning this test can be used) for the duration of the COVID-19 declaration under Section 564(b)(1) of the Act, 21 U.S.C.section 360bbb-3(b)(1), unless the authorization is terminated  or revoked sooner.       Influenza A by PCR NEGATIVE NEGATIVE Final   Influenza B by PCR NEGATIVE NEGATIVE Final    Comment: (NOTE) The Xpert Xpress SARS-CoV-2/FLU/RSV plus assay is intended as an aid in the diagnosis of influenza from Nasopharyngeal swab specimens and should not be used as a sole basis for treatment. Nasal washings and aspirates are unacceptable for Xpert Xpress SARS-CoV-2/FLU/RSV testing.  Fact  Sheet for Patients: EntrepreneurPulse.com.au  Fact Sheet for Healthcare Providers: IncredibleEmployment.be  This test is not yet approved or cleared by the [place] FDA and has been authorized for detection and/or diagnosis of SARS-CoV-2 by FDA under an Emergency Use Authorization (EUA). This EUA will remain in effect (meaning this test can be used) for the duration of the COVID-19 declaration under Section 564(b)(1) of the Act, 21 U.S.C. section 360bbb-3(b)(1), unless the authorization is terminated or revoked.  Performed at [facility], [address] [place], [place] [address]   Blood culture (routine x 2)     Status: None   Collection Time: 06/27/21 12:43 AM   Specimen: BLOOD  Result Value Ref Range Status   Specimen  Description BLOOD RIGHT ASSIST CONTROL  Final   Special Requests   Final    BOTTLES DRAWN AEROBIC AND ANAEROBIC Blood Culture results may not be optimal due to an inadequate volume of blood received in culture bottles   Culture   Final    NO GROWTH 5 DAYS Performed at [facility], [address] [place], [place] [address]    Report Status 07/02/2021 FINAL  Final  Blood culture (routine x 2)     Status: None   Collection Time: 06/27/21  1:45 AM   Specimen: BLOOD  Result Value Ref Range Status   Specimen Description BLOOD RIGHT ASSIST CONTROL  Final   Special Requests   Final    BOTTLES DRAWN AEROBIC AND ANAEROBIC Blood Culture adequate volume   Culture   Final    NO GROWTH 5 DAYS Performed at [facility], [address] [place], [place] [address]    Report Status 07/02/2021 FINAL  Final  Urine Culture     Status: Abnormal   Collection Time: 06/27/21  2:39 AM   Specimen: In/Out Cath Urine  Result Value Ref Range Status   Specimen Description   Final    IN/OUT CATH URINE Performed at [facility], [address], [place], [place] [address]    Special Requests   Final     NONE Performed at [facility], [place], [place] [address]    Culture 30,000 COLONIES/mL YEAST (A)  Final   Report Status 06/28/2021 FINAL  Final  MRSA Next Gen by PCR, Nasal     Status: None   Collection Time: 06/30/21  5:01 AM   Specimen: Nasal Mucosa; Nasal Swab  Result Value Ref Range Status   MRSA by PCR Next Gen NOT DETECTED NOT DETECTED Final    Comment: (NOTE) The GeneXpert MRSA Assay (FDA approved for NASAL specimens only), is one component of a comprehensive MRSA colonization surveillance program. It is not intended to diagnose MRSA infection nor to guide or monitor treatment for MRSA infections. Test performance is not FDA approved in patients less than 49 years old. Performed at [facility], [address] [place], [place] [address]   Urine Culture     Status: Abnormal   Collection Time: 07/05/21 10:36 AM   Specimen: PATH Other; Urine  Result Value Ref Range Status   Specimen Description   Final    URINE, RANDOM LEFT RENAL PELVIS URINE Performed at [facility], [address] [place], [place] [address]    Special Requests   Final    NONE Performed at [facility], [address] [place], [place] [address]    Culture 70,000 [place] (A)  Final   Report Status 07/12/2021 FINAL  Final  Urine Culture     Status: Abnormal   Collection Time: 07/05/21 10:48 AM   Specimen: PATH Other; Urine  Result Value Ref Range Status   Specimen Description   Final    URINE, RANDOM RIGHT KIDNEY URINE Performed at [facility], [address], [place], [place] [address]    Special Requests   Final    NONE Performed at [facility], [address] [place], [place] [address]    Culture (A)  Final    >=100,000 COLONIES/mL CANDIDA GLABRATA SEE SEPARATE REPORT Performed at [facility], [place] [address] [place], [place] [address]    Report Status 07/17/2021 FINAL  Final   Antifungal AST 9  Drug Panel     Status: None   Collection Time: 07/05/21 10:48 AM  Result Value Ref Range Status   Organism ID, Yeast Candida glabrata  Corrected    Comment: (NOTE) Identification performed by account, not confirmed by this laboratory. CORRECTED ON 08/21 AT 1535: PREVIOUSLY REPORTED AS Preliminary report    Amphotericin B MIC 0.5 ug/mL  Final    Comment: (NOTE) Breakpoints have been established for only some organism-drug combinations as indicated. This test was developed and its performance characteristics determined by [facility]. It has not been cleared or approved by the Food and Drug Administration.    Anidulafungin MIC Comment  Final    Comment: (NOTE) 0.03 ug/mL Susceptible Breakpoints have been established for only some organism-drug combinations as indicated. This test was developed and its performance characteristics determined by [facility]. It has not been cleared or approved by the Food and Drug Administration.    Caspofungin MIC Comment  Final    Comment: (NOTE) 0.06 ug/mL Susceptible Breakpoints have been established for only some organism-drug combinations as indicated. This test was developed and its performance characteristics determined by [facility]. It has not been cleared or approved by the Food and Drug Administration.    Micafungin MIC Comment  Final    Comment: (NOTE) 0.016 ug/mL Susceptible Breakpoints have been established for only some organism-drug combinations as indicated. This test was developed and its performance characteristics determined by [facility]. It has not been cleared or approved by the Food and Drug Administration.    Posaconazole MIC 0.25 ug/mL  Final    Comment: (NOTE) Breakpoints have been established for only some organism-drug combinations as indicated. This test was developed and its performance characteristics determined by [facility]. It has not been cleared or approved by the Food and Drug Administration.     Fluconazole Islt MIC 2.0 ug/mL  Final    Comment: (NOTE) Susceptible Dose Dependent Breakpoints have been established for only some organism-drug combinations as indicated. This test was developed and its performance characteristics determined by [facility]. It has not been cleared or approved by the Food and Drug Administration.    Flucytosine MIC 0.06 ug/mL or less  Final    Comment: (NOTE) Breakpoints have been established for only some organism-drug combinations as indicated. This test was developed and its performance characteristics determined by [facility]. It has not been cleared or approved by the Food and Drug Administration.    Itraconazole MIC 0.12 ug/mL  Final    Comment: (NOTE) Breakpoints have been established for only some organism-drug combinations as indicated. This test was developed and its performance characteristics determined by [facility]. It has not been cleared or approved by the Food and Drug Administration.    Voriconazole MIC 0.06 ug/mL  Final    Comment: (NOTE) Breakpoints have been established for only some organism-drug combinations as indicated. This test was developed and its performance characteristics determined by [facility]. It has not been cleared or approved by the Food and Drug Administration. Performed At: [facility] [address] [place], [place] [id number] Rush Farmer MD SE:[id number]    Source CANDIDA GLABRATA SUSCEPTIBILITY URINE CULTURE  Final    Comment: Performed at [facility], [place] [address] [place], [place] [address]  CULTURE, BLOOD (ROUTINE X 2) w Reflex to ID Panel     Status: None   Collection Time: 07/15/21  7:57 PM   Specimen: BLOOD  Result Value Ref Range Status   Specimen Description BLOOD LEFT ASSIST CONTROL  Final   Special  Requests   Final    BOTTLES DRAWN AEROBIC AND ANAEROBIC Blood Culture adequate volume   Culture   Final    NO GROWTH 5 DAYS Performed at [facility], [address] [place], [place] [address]    Report Status 07/20/2021 FINAL  Final  CULTURE, BLOOD (ROUTINE X 2) w Reflex to ID Panel     Status: None   Collection Time: 07/15/21  8:07 PM   Specimen: BLOOD  Result Value Ref Range Status   Specimen Description BLOOD LEFT FOREARM  Final   Special Requests   Final    BOTTLES DRAWN AEROBIC AND ANAEROBIC Blood Culture adequate volume   Culture   Final    NO GROWTH 5 DAYS Performed at [facility], [address], [place], [place] [address]    Report Status 07/20/2021 FINAL  Final  Body fluid culture w Gram Stain     Status: None (Preliminary result)   Collection Time: 07/21/21  1:23 PM   Specimen: Peritoneal Washings; Body Fluid  Result Value Ref Range Status   Specimen Description   Final    PERITONEAL Performed at [facility], [address] [place], [place] [address]    Special Requests   Final    NONE Performed at [facility], [address] [place], [place] [address]    Gram Stain   Final    MODERATE WBC PRESENT,BOTH PMN AND MONONUCLEAR NO ORGANISMS SEEN Performed at [facility], [place] [address], [place], [place] [address]    Culture RARE YEAST  Final   Report Status PENDING  Incomplete  Aerobic/Anaerobic Culture w Gram Stain (surgical/deep wound)     Status: None (Preliminary result)   Collection Time: 07/21/21  3:32 PM   Specimen: Abdomen  Result Value Ref Range Status   Specimen Description   Final    ABDOMEN Performed at [facility], [address] [place], [place] [address]    Special Requests   Final    NONE Performed at [facility], [address] [place], [place] [address]    Gram Stain   Final    MODERATE SQUAMOUS EPITHELIAL CELLS PRESENT WBC PRESENT, PREDOMINANTLY PMN NO ORGANISMS SEEN    Culture   Final    RARE YEAST IDENTIFICATION TO FOLLOW Performed at [facility], [place] [address] [place], [place] [address]    Report Status PENDING  Incomplete     Coagulation Studies: No results for input(s): LABPROT, INR in the last 72 hours.    Urinalysis: No results for input(s): COLORURINE, LABSPEC, PHURINE, GLUCOSEU, HGBUR, BILIRUBINUR, KETONESUR, PROTEINUR, UROBILINOGEN, NITRITE, LEUKOCYTESUR in the last 72 hours.  Invalid input(s): APPERANCEUR      Imaging: US RENAL  Result Date: 07/24/2021 CLINICAL DATA:  Sudden drop in hemoglobin.  Rule out renal hematoma. EXAM: RENAL / URINARY TRACT ULTRASOUND COMPLETE COMPARISON:  CT abdomen pelvis 07/20/2021 FINDINGS: Right Kidney: Renal measurements: 13.8 x 6.5 x 6.4 cm = volume: 301 mL. Echogenicity within normal limits. No mass or hydronephrosis visualized. 18 mm nonobstructing calculus noted in the lower pole. Left Kidney: Renal measurements: 13.0 x 6.7 x 7.0 cm = volume: 320 mL. Echogenicity within normal limits. No mass or hydronephrosis visualized. 16 mm nonobstructing calculus seen in the upper pole. 9.7 x 2.4 x 6.7 cm fluid collection noted adjacent to the left kidney. Bladder: Appears normal for degree of bladder distention. Other: None. IMPRESSION: 9.7 x 2.4 x 6.7 cm fluid collection adjacent  to the left kidney most likely relates to residual abscess/phlegmon. Hyperacute hematoma could have a similar appearance. Consider further evaluation with abdominal CT. Electronically Signed   By: Miachel Roux M.D.   On: 07/24/2021 14:51   DG Chest Port 1 View  Result Date: 07/24/2021 CLINICAL DATA:  Respiratory failure EXAM: PORTABLE CHEST 1 VIEW COMPARISON:  07/20/2021 FINDINGS: Gastric catheter is noted extending into the stomach. Left jugular central line is again seen and stable. Cardiac shadow is enlarged but stable. Vascular congestion is noted as well as basilar atelectasis overall unchanged from the prior exam. No pneumothorax is seen. No sizable effusion is noted. IMPRESSION: Stable bibasilar atelectasis. Mild vascular congestion is noted. Electronically Signed   By: Inez Catalina M.D.   On:  07/24/2021 15:43     Medications:    sodium chloride Stopped (07/23/21 0603)   amphotericin B  Conventional (FUNGIZONE) IV Stopped (07/26/21 0156)   [START ON 07/28/2021]  ceFAZolin (ANCEF) IV     feeding supplement (GLUCERNA 1.5 CAL) 1,000 mL (07/24/21 1656)   feeding supplement (GLUCERNA 1.5 CAL)      sodium chloride   Intravenous Once   acetaminophen (TYLENOL) oral liquid 160 mg/5 mL  650 mg Per Tube Q24H   amLODipine  10 mg Per Tube Daily   chlorhexidine  15 mL Mouth Rinse BID   Chlorhexidine Gluconate Cloth  6 each Topical Q0600   cloNIDine  0.1 mg Per Tube Daily   collagenase   Topical Daily   dextrose  10 mL Intravenous Q24H   dextrose  10 mL Intravenous Q24H   [START ON 07/28/2021] epoetin (EPOGEN/PROCRIT) injection  20,000 Units Subcutaneous Weekly   free water  125 mL Per Tube Q4H   furosemide  80 mg Intravenous Q12H   heparin injection (subcutaneous)  5,000 Units Subcutaneous Q8H   insulin aspart  0-20 Units Subcutaneous Q4H   insulin aspart  6 Units Subcutaneous Q4H   insulin detemir  30 Units Subcutaneous BID   mouth rinse  15 mL Mouth Rinse q12n4p   metoprolol tartrate  100 mg Per Tube BID   multivitamin  1 tablet Per Tube QHS   nutrition supplement (JUVEN)  1 packet Per Tube BID BM   pantoprazole (PROTONIX) IV  40 mg Intravenous Q12H   sodium chloride  500 mL Intravenous Q24H   sodium chloride  500 mL Intravenous Q24H   sodium chloride flush  5 mL Intracatheter Q8H     Assessment/ Plan:  [name] is a 75 y.o. white female with diabetes mellitus type II, hypertension, hyperlipidemia, nephrolithiasis, depression, gout who is admitted to [facility] on 06/27/2021 for Hyperkalemia [E87.5] Hyponatremia [E87.1] ARF (acute renal failure) (HCC) [N17.9] Acute renal failure (ARF) (HCC) [N17.9] Acute respiratory failure with hypoxia ([place]) [J96.01] Demand ischemia of myocardium (HCC) [I24.8] Acute respiratory failure with hypoxemia (HCC) [J96.01] Acute renal failure  superimposed on chronic kidney disease, unspecified CKD stage, unspecified acute renal failure type ([place]) [N17.9, N18.9] Sepsis, due to unspecified organism, unspecified whether acute organ dysfunction present [facility]) [A41.9]  Acute Kidney Injury on chronic kidney disease stage IV with baseline creatinine 1.97 and GFR of 25 on 12/22/19.  Acute kidney injury secondary to sepsis and obstructive uropathy. Chronic kidney disease is likely secondary to diabetic nephropathy.Nonoliguric urine output.  - Continue to hold losartan and metformin -Urine output is fair.  Electrolytes and volume status are acceptable.  No acute indication for dialysis at present.  Serum creatinine appears to be stabilizing.  will  monitor closely on a daily basis.  Lab Results  Component Value Date   CREATININE 2.64 (H) 07/26/2021   CREATININE 2.88 (H) 07/25/2021   CREATININE 2.78 (H) 07/24/2021    Intake/Output Summary (Last 24 hours) at 07/26/2021 1001 Last data filed at 07/26/2021 0827 Gross per 24 hour  Intake 2145.02 ml  Output 4860 ml  Net -2714.98 ml     2. Acute Respiratory failure secondary to community acquired pneumonia failing outpatient antibiotics. Febrile.  - Continue supplement oxygen.  - IV amphotericin B -8/26-fluid aspirate cultures are showing rare yeast. - 8/20 blood cultures with no growth    3. Anemia of chronic kidney disease Normocytic Lab Results  Component Value Date   HGB 6.9 (L) 07/26/2021   -Scheduled to get EPO 20,000 units weekly/Friday -Plan for blood transfusion  4.  Diabetes mellitus type II with chronic kidney disease insulin dependent. Most recent hemoglobin A1c is 8.3% on 06/12/21.  Metformin held    5.Bilateral Nephrolithiasis- with obstructive uropathy and left renal abscess Abscess drained on August 26  6. Hypokalemia: - Continue potassium supplementation as needed  7.  Hypertension.  Currently being treated with Norvasc, clonidine, IV furosemide, metoprolol      LOS: [place] 8/31/202210:01 AM

## 2021-07-26 NOTE — Progress Notes (Signed)
OT Cancellation Note  Patient Details Name: Heather Lopez MRN: [id number] DOB: 1946-09-28   Cancelled Treatment:    Reason Eval/Treat Not Completed: Medical issues which prohibited therapy. Chart reviewed - pt noted to have Hgb and Hct critically low at 6.9, 21.4; contraindicated for exertional activity at this time. Will continue to follow and initiate services as pt medically appropriate to participate in therapy.   Dessie Coma, M.S. OTR/L  07/26/21, 8:49 AM  ascom [phone]

## 2021-07-27 ENCOUNTER — Inpatient Hospital Stay: Payer: Medicare Other

## 2021-07-27 DIAGNOSIS — N390 Urinary tract infection, site not specified: Secondary | ICD-10-CM | POA: Diagnosis not present

## 2021-07-27 DIAGNOSIS — N151 Renal and perinephric abscess: Secondary | ICD-10-CM | POA: Diagnosis not present

## 2021-07-27 LAB — BPAM RBC
Blood Product Expiration Date: 202209282359
Blood Product Expiration Date: 202209282359
Blood Product Expiration Date: 202209302359
ISSUE DATE / TIME: 202208281307
ISSUE DATE / TIME: 202208281514
ISSUE DATE / TIME: 202208312330
Unit Type and Rh: 5100
Unit Type and Rh: 5100
Unit Type and Rh: 5100

## 2021-07-27 LAB — BASIC METABOLIC PANEL
Anion gap: 14 (ref 5–15)
BUN: 85 mg/dL — ABNORMAL HIGH (ref 8–23)
CO2: 23 mmol/L (ref 22–32)
Calcium: 9.1 mg/dL (ref 8.9–10.3)
Chloride: 105 mmol/L (ref 98–111)
Creatinine, Ser: 2.38 mg/dL — ABNORMAL HIGH (ref 0.44–1.00)
GFR, Estimated: 21 mL/min — ABNORMAL LOW (ref >60)
Glucose, Bld: 148 mg/dL — ABNORMAL HIGH (ref 70–99)
Potassium: 3.3 mmol/L — ABNORMAL LOW (ref 3.5–5.1)
Sodium: 142 mmol/L (ref 135–145)

## 2021-07-27 LAB — TYPE AND SCREEN
ABO/RH(D): O POS
Antibody Screen: NEGATIVE
Unit division: 0
Unit division: 0
Unit division: 0

## 2021-07-27 LAB — BLOOD GAS, ARTERIAL
Acid-base deficit: 6.4 mmol/L — ABNORMAL HIGH (ref 0.0–2.0)
Bicarbonate: 19.1 mmol/L — ABNORMAL LOW (ref 20.0–28.0)
FIO2: 0.4
O2 Saturation: 97.5 %
Patient temperature: 37
pCO2 arterial: 37 mmHg (ref 32.0–48.0)
pH, Arterial: 7.32 — ABNORMAL LOW (ref 7.350–7.450)
pO2, Arterial: 104 mmHg (ref 83.0–108.0)

## 2021-07-27 LAB — GLUCOSE, CAPILLARY
Glucose-Capillary: 166 mg/dL — ABNORMAL HIGH (ref 70–99)
Glucose-Capillary: 132 mg/dL — ABNORMAL HIGH (ref 70–99)
Glucose-Capillary: 171 mg/dL — ABNORMAL HIGH (ref 70–99)
Glucose-Capillary: 141 mg/dL — ABNORMAL HIGH (ref 70–99)
Glucose-Capillary: 140 mg/dL — ABNORMAL HIGH (ref 70–99)
Glucose-Capillary: 118 mg/dL — ABNORMAL HIGH (ref 70–99)

## 2021-07-27 LAB — CBC
HCT: 25.2 % — ABNORMAL LOW (ref 36.0–46.0)
Hemoglobin: 8.2 g/dL — ABNORMAL LOW (ref 12.0–15.0)
MCH: 29.3 pg (ref 26.0–34.0)
MCHC: 32.5 g/dL (ref 30.0–36.0)
MCV: 90 fL (ref 80.0–100.0)
Platelets: 321 10*3/uL (ref 150–400)
RBC: 2.8 MIL/uL — ABNORMAL LOW (ref 3.87–5.11)
RDW: 15.9 % — ABNORMAL HIGH (ref 11.5–15.5)
WBC: 13.3 10*3/uL — ABNORMAL HIGH (ref 4.0–10.5)
nRBC: 0.2 % (ref 0.0–0.2)

## 2021-07-27 MED ORDER — POTASSIUM CHLORIDE 10 MEQ/100ML IV SOLN
10.0000 meq | INTRAVENOUS | Status: AC
Start: 2021-07-27 — End: 2021-07-27
  Administered 2021-07-27 (×4): 10 meq via INTRAVENOUS
  Filled 2021-07-27 (×4): qty 100

## 2021-07-27 MED ORDER — BLISTEX MEDICATED EX OINT
TOPICAL_OINTMENT | CUTANEOUS | Status: DC | PRN
  Filled 2021-07-27: qty 6.3

## 2021-07-27 NOTE — Progress Notes (Signed)
Physical Therapy Treatment Patient Details Name: Heather Lopez MRN: [id number] DOB: December 17, 1945 Today's Date: 07/27/2021    History of Present Illness Pt is a [name] who presented to the ED on 06/27/21 with chief complaints of progressive shortness of breath, productive cough, loss of appetite, nausea, dry heaving fatigue, malaise and generalized body aches. Patient initially went to her PCP on 7/29 with complaints of rhinorrhea, harsh cough, loss of appetite, nausea, fatigue, malaise and generalized body aches x2 days, patient was diagnosed with pneumonia of which she received ceftriaxone 1 mg IM and sent home with Levaquin, prednisone and Tessalon. On 8/4 pt developed acute abdominal pain with CT showing viscus perforation. Pt urgently taken to the OR for ex lap. Pt is also s/p cystoscopy with B ureteral stent placement for L obstructed kidney stone on 8/10.    PT Comments    Pt is slowly making progress towards therapy goals. Pt continues to be limited by fatigue, pain, and poor activity tolerance. Pt able to demonstrate some active movement of B knees and tolerated limited PROM of B LE joints. Pt demonstrating lack of cervical rotation with incentive for having ice chips throughout session. Pt oxygen remained >90% with 40L with HFNC during all activities. PT updating recommendations to [facility] due to current mobility related deficits.     Follow Up Recommendations  LTACH;Supervision/Assistance - 24 hour     Equipment Recommendations  None recommended by PT    Recommendations for Other Services       Precautions / Restrictions Precautions Precautions: Fall Precaution Comments: NG tube, abdominal incision & JP drains, foley catheter, fecal tube, L jugular tunneled hemodialysis catheter Restrictions Weight Bearing Restrictions: No    Mobility  Bed Mobility Overal bed mobility: Needs Assistance Bed Mobility:  (Repositioning in bed; Refusing to roll secondary to pain)      General bed  mobility comments: Total assistance +2 for repositioning towards HOB. Pt unable to assist with cervical flexion or crossing arms during transfer    Transfers   Not safe to attempt at this time      Ambulation/Gait    Not safe to attempt at this time     Stairs    Not safe to attempt at this time    Wheelchair Mobility    Modified Rankin (Stroke Patients Only)       Balance      Cognition Arousal/Alertness: Lethargic Behavior During Therapy: Flat affect Overall Cognitive Status: Impaired/Different from baseline       Exercises Other Exercises Other Exercises: Supine AAROM: SAQ 1 x 5 with 3 second holds, hip abduction 1 x 10 bilat. Supine PROM: ankle dorsiflexion and plantar flexion x 20 bilaterally, heel slides x 10 bilaterally. UE PROM: Elbow flexion x 10 bilat, wrist extension x 20, finger extension x 20 bilat. Other Exercises: Repositioning: Pt required totalA + 2 for repositioning in bed with usage of bed linens and trendelenburg to assist. Pt demonstating inability to rotate or flex her cervical spine to assist with transfer. Place pillow underneath bilateral UEs and placed towel rolls in each hand to assist with finger extension. Pt donned prevalon boots bilaterally to reduce risk of pressure injury and neutral rotation of B LEs.    General Comments        Pertinent Vitals/Pain Pain Assessment: Faces Faces Pain Scale: Hurts even more Pain Location: B LEs Pain Descriptors / Indicators: Discomfort;Guarding;Grimacing Pain Intervention(s): Limited activity within patient's tolerance;Monitored during session;Repositioned    Home Living  Prior Function            PT Goals (current goals can now be found in the care plan section) Acute Rehab PT Goals Patient Stated Goal: to go home PT Goal Formulation: With family Time For Goal Achievement: 07/29/21 Potential to Achieve Goals: Fair Progress towards PT goals: Progressing toward goals     Frequency    Min 2X/week      PT Plan Discharge plan needs to be updated    Co-evaluation     PT goals addressed during session: Strengthening/ROM;Mobility/safety with mobility        AM-PAC PT "6 Clicks" Mobility   Outcome Measure  Help needed turning from your back to your side while in a flat bed without using bedrails?: Total Help needed moving from lying on your back to sitting on the side of a flat bed without using bedrails?: Total Help needed moving to and from a bed to a chair (including a wheelchair)?: Total Help needed standing up from a chair using your arms (e.g., wheelchair or bedside chair)?: Total Help needed to walk in hospital room?: Total Help needed climbing 3-5 steps with a railing? : Total 6 Click Score: 6    End of Session Equipment Utilized During Treatment: Oxygen Activity Tolerance: Patient limited by fatigue Patient left: in bed;with call bell/phone within reach;with bed alarm set;with family/visitor present Nurse Communication: Mobility status PT Visit Diagnosis: Muscle weakness (generalized) (M62.81);Difficulty in walking, not elsewhere classified (R26.2)     Time: 1505-6979 PT Time Calculation (min) (ACUTE ONLY): 38 min  Charges:  $Therapeutic Exercise: 8-22 mins $Therapeutic Activity: 23-37 mins                    Andrey Campanile, SPT    Andrey Campanile 07/27/2021, 4:18 PM

## 2021-07-27 NOTE — Consult Note (Signed)
PHARMACY CONSULT NOTE - FOLLOW UP  Pharmacy Consult for Electrolyte Monitoring and Replacement   Recent Labs: Potassium (mmol/L)  Date Value  07/27/2021 3.3 (L)  01/29/2014 4.0   Magnesium (mg/dL)  Date Value  07/26/2021 2.1  01/29/2014 1.8   Calcium (mg/dL)  Date Value  07/27/2021 9.1   Calcium, Total (mg/dL)  Date Value  01/29/2014 9.7   Albumin (g/dL)  Date Value  07/21/2021 1.6 (L)  07/19/2015 4.5  01/27/2014 4.1   Phosphorus (mg/dL)  Date Value  07/26/2021 5.0 (H)   Sodium (mmol/L)  Date Value  07/27/2021 142  07/19/2015 142  01/29/2014 133 (L)     Assessment: 75yo female w/ h/o HTN, DM, HLD, & MDD presenting with weakness/AKI/sepsis with growth of Candida glabrata pyelonephritis from 8/17 Ucx. Nephrology consulted. Pharmacy consulted to manage PRN mgmt of electrolytes  Goal of Therapy:  Lytes WNL  Plan:   Scr 2.88>2.64>2.38 K 3.4>3.3>3.3 replace with IV KCL 10 mEq q1h x4 today. Pt continues on Lasix IV 80mg  q12h Ordered AM labs for tomorrow, will CTM and adjust PRN  Lorna Dibble ,PharmD Clinical Pharmacist 07/27/2021 2:51 PM

## 2021-07-27 NOTE — Plan of Care (Signed)
  Problem: Health Behavior/Discharge Planning: Goal: Ability to manage health-related needs will improve Outcome: Progressing   Problem: Clinical Measurements: Goal: Ability to maintain clinical measurements within normal limits will improve Outcome: Progressing Goal: Will remain free from infection Outcome: Progressing Goal: Diagnostic test results will improve Outcome: Progressing Goal: Respiratory complications will improve Outcome: Progressing Goal: Cardiovascular complication will be avoided Outcome: Progressing   Problem: Activity: Goal: Risk for activity intolerance will decrease Outcome: Progressing   Problem: Nutrition: Goal: Adequate nutrition will be maintained Outcome: Progressing   Problem: Coping: Goal: Level of anxiety will decrease Outcome: Progressing   Problem: Elimination: Goal: Will not experience complications related to bowel motility Outcome: Progressing Goal: Will not experience complications related to urinary retention Outcome: Progressing   Problem: Pain Managment: Goal: General experience of comfort will improve Outcome: Progressing   Problem: Safety: Goal: Ability to remain free from injury will improve Outcome: Progressing   Problem: Skin Integrity: Goal: Risk for impaired skin integrity will decrease Outcome: Progressing   Problem: Clinical Measurements: Goal: Ability to maintain clinical measurements within normal limits will improve Outcome: Progressing Goal: Postoperative complications will be avoided or minimized Outcome: Progressing   Problem: Skin Integrity: Goal: Demonstration of wound healing without infection will improve Outcome: Progressing

## 2021-07-27 NOTE — Progress Notes (Signed)
[facility]  ROUNDING NOTE   Subjective:   Heather Lopez is a 75 y.o. white female who was admitted for community acquired pneumonia on 06/27/2021     Hospital course complicated by duodenal perforation s/p repair on 06/30/2021 Left obstructive hydronephrosis and renal abscess, Patient underwent cystoscopy and b/l ureteral stent placement 8/10 8/26- CT guided aspiration of perirenal abscess- 5 ml purulent fluid aspirated Cultures neg so far  UOP has improved with foley placement Lab Results  Component Value Date   CREATININE 2.38 (H) 07/27/2021   CREATININE 2.64 (H) 07/26/2021   CREATININE 2.88 (H) 07/25/2021   08/31 0701 - 09/01 0700 In: 3407.2 [I.V.:228.6; Blood:370; NG/GT:1460; IV Piggyback:1348.6] Out: 5225 [Urine:4625; Stool:600]   Continues to have low-grade fever of 99 Tube feeds Nepro 60 cc/h via NG tube High flow Nasal cannula 40 L/min oxygen supplementation, 70 % BiPAP at night Mouth appears dry- able to eat ice chips.  Alert today and following simple commands Has a temp spike 101.4      Objective:  Vital signs in last 24 hours:  Temp:  [98.3 F (36.8 C)-101.4 F (38.6 C)] 101.4 F (38.6 C) (09/01 0800) Pulse Rate:  [83-108] 108 (09/01 0800) Resp:  [22-33] 24 (09/01 0800) BP: (128-171)/(47-103) 158/57 (09/01 0800) SpO2:  [92 %-100 %] 93 % (09/01 0800) FiO2 (%):  [70 %-75 %] 70 % (09/01 0800) Weight:  [151 kg] 151 kg (09/01 0325)  Weight change:  Filed Weights   07/18/21 0500 07/22/21 0500 07/27/21 0325  Weight: 125.9 kg 125.9 kg (!) 151 kg    Intake/Output: I/O last 3 completed shifts: In: 4707.2 [I.V.:358.6; Blood:370; NG/GT:2130; IV Piggyback:1848.6] Out: 7185 [Urine:6225; Drains:10; Stool:950]   Intake/Output this shift:  Total I/O In: 10 [I.V.:10] Out: -   Physical Exam: General: Critically ill  Head: HFNC O2, NGT in place  Eyes: Anicteric  Lungs:  +coarse breath sounds  Heart: tachycardia  Abdomen:  midline incision   Extremities:  + peripheral edema   Neurologic:  Following commands  GU Foley with yellow urine  Access:  LIJ permcath 8/18  Rectal tube  Basic Metabolic Panel: Recent Labs  Lab 07/22/21 1739 07/23/21 0634 07/24/21 0629 07/24/21 1642 07/25/21 0616 07/26/21 0618 07/27/21 0649  NA  --  138 135  --  138 143 142  K 3.0* 3.1* 3.1* 3.7 3.4* 3.3* 3.3*  CL  --  100 101  --  101 106 105  CO2  --  25 23  --  22 24 23   GLUCOSE  --  178* 96  --  156* 114* 148*  BUN  --  60* 65*  --  70* 78* 85*  CREATININE  --  2.55* 2.78*  --  2.88* 2.64* 2.38*  CALCIUM  --  8.9 8.6*  --  8.9 9.0 9.1  MG 2.0 2.2 1.9  --  2.0 2.1  --   PHOS  --   --   --   --   --  5.0*  --      Liver Function Tests: Recent Labs  Lab 07/21/21 0528  AST 33  ALT 53*  ALKPHOS 101  BILITOT 0.5  PROT 5.8*  ALBUMIN 1.6*    No results for input(s): LIPASE, AMYLASE in the last 168 hours. No results for input(s): AMMONIA in the last 168 hours.  CBC: Recent Labs  Lab 07/21/21 0528 07/23/21 0634 07/24/21 0629 07/25/21 0616 07/26/21 0618  WBC 15.4* 15.2* 15.5* 14.9* 14.4*  NEUTROABS 13.2*  11.3* 12.4* 12.2* 11.9*  HGB 7.4* 5.8* 7.2* 8.1* 6.9*  HCT 23.5* 18.6* 21.1* 24.6* 21.4*  MCV 85.8 87.3 86.8 87.5 89.9  PLT 303 274 225 252 247     Cardiac Enzymes: No results for input(s): CKTOTAL, CKMB, CKMBINDEX, TROPONINI in the last 168 hours.  BNP: Invalid input(s): POCBNP  CBG: Recent Labs  Lab 07/26/21 1723 07/26/21 2004 07/26/21 2345 07/27/21 0336 07/27/21 0717  GLUCAP 150* 185* 196* 166* 132*     Microbiology: Results for orders placed or performed during the hospital encounter of 06/27/21  Resp Panel by RT-PCR (Flu A&B, Covid) Nasopharyngeal Swab     Status: None   Collection Time: 06/27/21 12:32 AM   Specimen: Nasopharyngeal Swab; Nasopharyngeal(NP) swabs in vial transport medium  Result Value Ref Range Status   SARS Coronavirus 2 by RT PCR NEGATIVE NEGATIVE Final    Comment:  (NOTE) SARS-CoV-2 target nucleic acids are NOT DETECTED.  The SARS-CoV-2 RNA is generally detectable in upper respiratory specimens during the acute phase of infection. The lowest concentration of SARS-CoV-2 viral copies this assay can detect is 138 copies/mL. A negative result does not preclude SARS-Cov-2 infection and should not be used as the sole basis for treatment or other patient management decisions. A negative result may occur with  improper specimen collection/handling, submission of specimen other than nasopharyngeal swab, presence of viral mutation(s) within the areas targeted by this assay, and inadequate number of viral copies(<138 copies/mL). A negative result must be combined with clinical observations, patient history, and epidemiological information. The expected result is Negative.  Fact Sheet for Patients:  EntrepreneurPulse.com.au  Fact Sheet for Healthcare Providers:  IncredibleEmployment.be  This test is no t yet approved or cleared by the [place] FDA and  has been authorized for detection and/or diagnosis of SARS-CoV-2 by FDA under an Emergency Use Authorization (EUA). This EUA will remain  in effect (meaning this test can be used) for the duration of the COVID-19 declaration under Section 564(b)(1) of the Act, 21 U.S.C.section 360bbb-3(b)(1), unless the authorization is terminated  or revoked sooner.       Influenza A by PCR NEGATIVE NEGATIVE Final   Influenza B by PCR NEGATIVE NEGATIVE Final    Comment: (NOTE) The Xpert Xpress SARS-CoV-2/FLU/RSV plus assay is intended as an aid in the diagnosis of influenza from Nasopharyngeal swab specimens and should not be used as a sole basis for treatment. Nasal washings and aspirates are unacceptable for Xpert Xpress SARS-CoV-2/FLU/RSV testing.  Fact Sheet for Patients: EntrepreneurPulse.com.au  Fact Sheet for Healthcare  Providers: IncredibleEmployment.be  This test is not yet approved or cleared by the [place] FDA and has been authorized for detection and/or diagnosis of SARS-CoV-2 by FDA under an Emergency Use Authorization (EUA). This EUA will remain in effect (meaning this test can be used) for the duration of the COVID-19 declaration under Section 564(b)(1) of the Act, 21 U.S.C. section 360bbb-3(b)(1), unless the authorization is terminated or revoked.  Performed at [facility], [address] [place], [place] [address]   Blood culture (routine x 2)     Status: None   Collection Time: 06/27/21 12:43 AM   Specimen: BLOOD  Result Value Ref Range Status   Specimen Description BLOOD RIGHT ASSIST CONTROL  Final   Special Requests   Final    BOTTLES DRAWN AEROBIC AND ANAEROBIC Blood Culture results may not be optimal due to an inadequate volume of blood received in culture bottles   Culture   Final  NO GROWTH 5 DAYS Performed at [facility], [address] [place], [place] [address]    Report Status 07/02/2021 FINAL  Final  Blood culture (routine x 2)     Status: None   Collection Time: 06/27/21  1:45 AM   Specimen: BLOOD  Result Value Ref Range Status   Specimen Description BLOOD RIGHT ASSIST CONTROL  Final   Special Requests   Final    BOTTLES DRAWN AEROBIC AND ANAEROBIC Blood Culture adequate volume   Culture   Final    NO GROWTH 5 DAYS Performed at [facility], [address], [place], [place] [address]    Report Status 07/02/2021 FINAL  Final  Urine Culture     Status: Abnormal   Collection Time: 06/27/21  2:39 AM   Specimen: In/Out Cath Urine  Result Value Ref Range Status   Specimen Description   Final    IN/OUT CATH URINE Performed at [facility], [address], [place], [place] [address]    Special Requests   Final    NONE Performed at [facility], [place], [place] [address]     Culture 30,000 COLONIES/mL YEAST (A)  Final   Report Status 06/28/2021 FINAL  Final  MRSA Next Gen by PCR, Nasal     Status: None   Collection Time: 06/30/21  5:01 AM   Specimen: Nasal Mucosa; Nasal Swab  Result Value Ref Range Status   MRSA by PCR Next Gen NOT DETECTED NOT DETECTED Final    Comment: (NOTE) The GeneXpert MRSA Assay (FDA approved for NASAL specimens only), is one component of a comprehensive MRSA colonization surveillance program. It is not intended to diagnose MRSA infection nor to guide or monitor treatment for MRSA infections. Test performance is not FDA approved in patients less than 20 years old. Performed at [facility], [address] [place], [place] [address]   Urine Culture     Status: Abnormal   Collection Time: 07/05/21 10:36 AM   Specimen: PATH Other; Urine  Result Value Ref Range Status   Specimen Description   Final    URINE, RANDOM LEFT RENAL PELVIS URINE Performed at [facility], [address], [place], [place] [address]    Special Requests   Final    NONE Performed at [facility], [address] [place], [place] [address]    Culture 70,000 [place] (A)  Final   Report Status 07/12/2021 FINAL  Final  Urine Culture     Status: Abnormal   Collection Time: 07/05/21 10:48 AM   Specimen: PATH Other; Urine  Result Value Ref Range Status   Specimen Description   Final    URINE, RANDOM RIGHT KIDNEY URINE Performed at [facility], [address] [place], [place] [address]    Special Requests   Final    NONE Performed at [facility], [address] [place], [place] [address]    Culture (A)  Final    >=100,000 COLONIES/mL CANDIDA GLABRATA SEE SEPARATE REPORT Performed at [facility], [place] [address], [place], [place] [address]    Report Status 07/17/2021 FINAL  Final  Antifungal AST 9 Drug Panel     Status: None   Collection Time: 07/05/21 10:48 AM  Result  Value Ref Range Status   Organism ID, Yeast Candida glabrata  Corrected    Comment: (NOTE) Identification performed by account, not confirmed by this laboratory. CORRECTED ON 08/21 AT 5465:  PREVIOUSLY REPORTED AS Preliminary report    Amphotericin B MIC 0.5 ug/mL  Final    Comment: (NOTE) Breakpoints have been established for only some organism-drug combinations as indicated. This test was developed and its performance characteristics determined by [facility]. It has not been cleared or approved by the Food and Drug Administration.    Anidulafungin MIC Comment  Final    Comment: (NOTE) 0.03 ug/mL Susceptible Breakpoints have been established for only some organism-drug combinations as indicated. This test was developed and its performance characteristics determined by [facility]. It has not been cleared or approved by the Food and Drug Administration.    Caspofungin MIC Comment  Final    Comment: (NOTE) 0.06 ug/mL Susceptible Breakpoints have been established for only some organism-drug combinations as indicated. This test was developed and its performance characteristics determined by [facility]. It has not been cleared or approved by the Food and Drug Administration.    Micafungin MIC Comment  Final    Comment: (NOTE) 0.016 ug/mL Susceptible Breakpoints have been established for only some organism-drug combinations as indicated. This test was developed and its performance characteristics determined by [facility]. It has not been cleared or approved by the Food and Drug Administration.    Posaconazole MIC 0.25 ug/mL  Final    Comment: (NOTE) Breakpoints have been established for only some organism-drug combinations as indicated. This test was developed and its performance characteristics determined by [facility]. It has not been cleared or approved by the Food and Drug Administration.    Fluconazole Islt MIC 2.0 ug/mL  Final    Comment: (NOTE) Susceptible Dose  Dependent Breakpoints have been established for only some organism-drug combinations as indicated. This test was developed and its performance characteristics determined by [facility]. It has not been cleared or approved by the Food and Drug Administration.    Flucytosine MIC 0.06 ug/mL or less  Final    Comment: (NOTE) Breakpoints have been established for only some organism-drug combinations as indicated. This test was developed and its performance characteristics determined by [facility]. It has not been cleared or approved by the Food and Drug Administration.    Itraconazole MIC 0.12 ug/mL  Final    Comment: (NOTE) Breakpoints have been established for only some organism-drug combinations as indicated. This test was developed and its performance characteristics determined by [facility]. It has not been cleared or approved by the Food and Drug Administration.    Voriconazole MIC 0.06 ug/mL  Final    Comment: (NOTE) Breakpoints have been established for only some organism-drug combinations as indicated. This test was developed and its performance characteristics determined by [facility]. It has not been cleared or approved by the Food and Drug Administration. Performed At: [facility] [address] [place], [place] [id number] Rush Farmer MD UD:[id number]    Source CANDIDA GLABRATA SUSCEPTIBILITY URINE CULTURE  Final    Comment: Performed at [facility], [place] [address] [place], [place] [address]  CULTURE, BLOOD (ROUTINE X 2) w Reflex to ID Panel     Status: None   Collection Time: 07/15/21  7:57 PM   Specimen: BLOOD  Result Value Ref Range Status   Specimen Description BLOOD LEFT ASSIST CONTROL  Final   Special Requests   Final    BOTTLES DRAWN AEROBIC AND ANAEROBIC Blood Culture adequate volume   Culture   Final    NO GROWTH 5 DAYS Performed at [facility], [address] [place], [place] [address]    Report Status 07/20/2021 FINAL  Final   CULTURE, BLOOD (ROUTINE X 2) w Reflex to ID Panel     Status: None   Collection Time: 07/15/21  8:07 PM   Specimen: BLOOD  Result Value Ref Range Status   Specimen Description BLOOD LEFT FOREARM  Final   Special Requests   Final    BOTTLES DRAWN AEROBIC AND ANAEROBIC Blood Culture adequate volume   Culture   Final    NO GROWTH 5 DAYS Performed at [facility], [address], [place], [place] [address]    Report Status 07/20/2021 FINAL  Final  Body fluid culture w Gram Stain     Status: None (Preliminary result)   Collection Time: 07/21/21  1:23 PM   Specimen: Peritoneal Washings; Body Fluid  Result Value Ref Range Status   Specimen Description   Final    PERITONEAL Performed at [facility], [address] [place], [place] [address]    Special Requests   Final    NONE Performed at [facility], [address] [place], [place] [address]    Gram Stain   Final    MODERATE WBC PRESENT,BOTH PMN AND MONONUCLEAR NO ORGANISMS SEEN    Culture   Final    RARE YEAST IDENTIFICATION AND SUSCEPTIBILITIES TO FOLLOW Performed at [facility], [place] [address], [place], [place] [address]    Report Status PENDING  Incomplete  Aerobic/Anaerobic Culture w Gram Stain (surgical/deep wound)     Status: None   Collection Time: 07/21/21  3:32 PM   Specimen: Abdomen  Result Value Ref Range Status   Specimen Description   Final    ABDOMEN Performed at [facility], [address], [place], [place] [address]    Special Requests   Final    NONE Performed at [facility], [address] [place], [place] [address]    Gram Stain   Final    MODERATE SQUAMOUS EPITHELIAL CELLS PRESENT WBC PRESENT, PREDOMINANTLY PMN NO ORGANISMS SEEN    Culture   Final    RARE CANDIDA GLABRATA NO ANAEROBES ISOLATED Performed at [facility], [place] [address] [place], [place] [address]    Report Status 07/26/2021 FINAL  Final    Coagulation  Studies: No results for input(s): LABPROT, INR in the last 72 hours.    Urinalysis: No results for input(s): COLORURINE, LABSPEC, PHURINE, GLUCOSEU, HGBUR, BILIRUBINUR, KETONESUR, PROTEINUR, UROBILINOGEN, NITRITE, LEUKOCYTESUR in the last 72 hours.  Invalid input(s): APPERANCEUR      Imaging: No results found.   Medications:    sodium chloride 5 mL/hr at 07/27/21 0800   amphotericin B  Conventional (FUNGIZONE) IV Stopped (07/27/21 0314)   [START ON 07/28/2021]  ceFAZolin (ANCEF) IV     feeding supplement (GLUCERNA 1.5 CAL) 1,000 mL (07/24/21 1656)   feeding supplement (GLUCERNA 1.5 CAL)      sodium chloride   Intravenous Once   sodium chloride   Intravenous Once   acetaminophen (TYLENOL) oral liquid 160 mg/5 mL  650 mg Per Tube Q24H   amLODipine  10 mg Per Tube Daily   chlorhexidine  15 mL Mouth Rinse BID   Chlorhexidine Gluconate Cloth  6 each Topical Q0600   cloNIDine  0.1 mg Per Tube Daily   collagenase   Topical Daily   dextrose  10 mL Intravenous Q24H   dextrose  10 mL Intravenous Q24H   [START ON 07/28/2021] epoetin (EPOGEN/PROCRIT) injection  20,000 Units Subcutaneous Weekly   free water  125  mL Per Tube Q4H   furosemide  80 mg Intravenous Q12H   heparin injection (subcutaneous)  5,000 Units Subcutaneous Q8H   insulin aspart  0-20 Units Subcutaneous Q4H   insulin aspart  6 Units Subcutaneous Q4H   insulin detemir  30 Units Subcutaneous BID   mouth rinse  15 mL Mouth Rinse q12n4p   metoprolol tartrate  100 mg Per Tube BID   multivitamin  1 tablet Per Tube QHS   nutrition supplement (JUVEN)  1 packet Per Tube BID BM   pantoprazole (PROTONIX) IV  40 mg Intravenous Q12H   sodium chloride  500 mL Intravenous Q24H   sodium chloride  500 mL Intravenous Q24H   sodium chloride flush  5 mL Intracatheter Q8H     Assessment/ Plan:  Ms. KYELLE URBAS is a 75 y.o. white female with diabetes mellitus type II, hypertension, hyperlipidemia, nephrolithiasis, depression, gout who  is admitted to [facility] on 06/27/2021 for Hyperkalemia [E87.5] Hyponatremia [E87.1] ARF (acute renal failure) (HCC) [N17.9] Acute renal failure (ARF) (HCC) [N17.9] Acute respiratory failure with hypoxia ([place]) [J96.01] Demand ischemia of myocardium (HCC) [I24.8] Acute respiratory failure with hypoxemia (HCC) [J96.01] Acute renal failure superimposed on chronic kidney disease, unspecified CKD stage, unspecified acute renal failure type ([place]) [N17.9, N18.9] Sepsis, due to unspecified organism, unspecified whether acute organ dysfunction present [facility]) [A41.9]  Acute Kidney Injury on chronic kidney disease stage IV with baseline creatinine 1.97 and GFR of 25 on 12/22/19.  Acute kidney injury secondary to sepsis and obstructive uropathy. Chronic kidney disease is likely secondary to diabetic nephropathy.Nonoliguric urine output.    -Urine output is fair.  Electrolytes and volume status are acceptable.  No acute indication for dialysis at present.  Serum creatinine appears to be stabilizing.  will monitor closely on a daily basis.  Lab Results  Component Value Date   CREATININE 2.38 (H) 07/27/2021   CREATININE 2.64 (H) 07/26/2021   CREATININE 2.88 (H) 07/25/2021    Intake/Output Summary (Last 24 hours) at 07/27/2021 0939 Last data filed at 07/27/2021 0800 Gross per 24 hour  Intake 3152.2 ml  Output 3825 ml  Net -672.8 ml   Currently getting lasix 80 mg iv BID  2. Acute Respiratory failure secondary to community acquired pneumonia failing outpatient antibiotics. Febrile.  - Continue supplement oxygen.  - IV amphotericin B -8/26-fluid aspirate cultures are showing rare yeast. - 8/20 blood cultures with no growth    3. Anemia of chronic kidney disease Normocytic Lab Results  Component Value Date   HGB 6.9 (L) 07/26/2021   -Scheduled to get EPO 20,000 units weekly/Friday -receiving blood transfusion periodically; latest 8/31  4.  Diabetes mellitus type II with chronic kidney disease insulin  dependent. Most recent hemoglobin A1c is 8.3% on 06/12/21.  Metformin held    5.Bilateral Nephrolithiasis- with obstructive uropathy and left renal abscess Abscess drained on August 26  6. Hypokalemia: - Continue potassium supplementation as needed  7.  Hypertension.  Currently being treated with Norvasc, clonidine, IV furosemide, metoprolol     LOS: 30 Ryosuke Ericksen 9/1/20229:39 AM

## 2021-07-27 NOTE — TOC Progression Note (Addendum)
Transition of Care [facility]) - Progression Note    Patient Details  Name: Heather Lopez MRN: [id number] Date of Birth: [birth year]  Transition of Care [facility]) CM/SW [place], [place] Phone Number: [phone] 07/27/2021, 7:27 AM  Clinical Narrative:     Patient remains medically unstable for d/c to [facility].  Patient's spouse Mckena, Chern (Spouse) [phone] has not decided on [facility] placement yet due to patient's condition.  Patient's Hg 6.9, Attending spoke with Mr. [place] about transfusion.   Expected Discharge Plan: [place] Barriers to Discharge: Continued Medical Work up  Expected Discharge Plan and Services Expected Discharge Plan: [place] In-house Referral: Clinical Social Work   Post Acute Care Choice: [place] Living arrangements for the past 2 months: Single Family Home                                       Social Determinants of Health (SDOH) Interventions    Readmission Risk Interventions No flowsheet data found.

## 2021-07-27 NOTE — Progress Notes (Addendum)
Date of Admission:  06/27/2021     ID: Heather Lopez is a 75 y.o. female  Active Problems:   Uncontrolled type 2 diabetes mellitus with insulin therapy ([place])   Hyperlipidemia associated with type 2 diabetes mellitus ([place])   Type 2 diabetes mellitus with stage 4 chronic kidney disease, with long-term current use of insulin (HCC)   Acute respiratory failure with hypoxia (HCC)   Severe sepsis with septic shock (HCC)   Acute lower UTI   Acute kidney injury superimposed on chronic kidney disease (HCC)   Pneumoperitoneum   Pressure injury of skin   Atrial flutter with rapid ventricular response (HCC)   Endotracheally intubated   Bilateral nephrolithiasis   Anemia of chronic disease   Demand ischemia of myocardium (HCC)   Lethargy   Duodenal ulcer perforation (HCC)   Renal abscess   Essential hypertension   Hypokalemia    Subjective: Fever today  Medications:   sodium chloride   Intravenous Once   sodium chloride   Intravenous Once   acetaminophen (TYLENOL) oral liquid 160 mg/5 mL  650 mg Per Tube Q24H   amLODipine  10 mg Per Tube Daily   chlorhexidine  15 mL Mouth Rinse BID   Chlorhexidine Gluconate Cloth  6 each Topical Q0600   cloNIDine  0.1 mg Per Tube Daily   collagenase   Topical Daily   dextrose  10 mL Intravenous Q24H   dextrose  10 mL Intravenous Q24H   [START ON 07/28/2021] epoetin (EPOGEN/PROCRIT) injection  20,000 Units Subcutaneous Weekly   free water  125 mL Per Tube Q4H   furosemide  80 mg Intravenous Q12H   heparin injection (subcutaneous)  5,000 Units Subcutaneous Q8H   insulin aspart  0-20 Units Subcutaneous Q4H   insulin aspart  6 Units Subcutaneous Q4H   insulin detemir  30 Units Subcutaneous BID   mouth rinse  15 mL Mouth Rinse q12n4p   metoprolol tartrate  100 mg Per Tube BID   multivitamin  1 tablet Per Tube QHS   nutrition supplement (JUVEN)  1 packet Per Tube BID BM   pantoprazole (PROTONIX) IV  40 mg Intravenous Q12H   sodium chloride  500 mL  Intravenous Q24H   sodium chloride  500 mL Intravenous Q24H   sodium chloride flush  5 mL Intracatheter Q8H    Objective: Vital signs in last 24 hours: Temp:  [98.3 F (36.8 C)-101.4 F (38.6 C)] 101.4 F (38.6 C) (09/01 0800) Pulse Rate:  [83-108] 108 (09/01 0800) Resp:  [22-33] 24 (09/01 0800) BP: (128-171)/(47-103) 158/57 (09/01 0800) SpO2:  [92 %-100 %] 93 % (09/01 0800) FiO2 (%):  [70 %-75 %] 70 % (09/01 0800) Weight:  [151 kg] 151 kg (09/01 0325)  PHYSICAL EXAM:  General: somnolent,opens eyes ot calling and is verbal NG tube Lungs: b/l air entry Heart: irregular Left  Abdomen: obese, left upper quadrant drain . foley Extremities: edema ankles Skin: No rashes or lesions. Or bruising Lymph: Cervical, supraclavicular normal. Neurologic: cannot assess  Lab Results Recent Labs    07/26/21 0618 07/27/21 0649 07/27/21 0950  WBC 14.4*  --  13.3*  HGB 6.9*  --  8.2*  HCT 21.4*  --  25.2*  NA 143 142  --   K 3.3* 3.3*  --   CL 106 105  --   CO2 24 23  --   BUN 78* 85*  --   CREATININE 2.64* 2.38*  --    Micro 06/27/21 : Blood  culture NG 06/27/21 UC yeast 07/05/21 urine tken from both kidneys -candida Glabrata 8/20 BC NG 07/21/21- renal abscess fluid- Candida glabrata 07/27/21 BC sent Assessment/Plan:   Patient presented with duodenal perforation and underwent laparotomy.  Complicated UTI.  Bilateral nephrolithiasis, hydronephrosis and stents placed on a 07/05/2021. Candida glabrata from the cultures taken from both kidneys.  Patient was initially on low-dose fluconazole which was increased to 800 mg. Patient was spiking fever and found to have worsening Left perirenal/renal abscess on the CT done on 07/20/2021.  Patient underwent drain placement to the left perirenal abscess on 07/21/2021.  Culture sent from that abscess is growing Candida glabrata.  Patient was started on Amphotericin on 07/21/2021.  Has had intermittent fevers since admission.  Was afebrile for a few  days.  Now today she had 101 temperature We will get a repeat ultrasound to make sure that the abscess has been drained completely and to check whether the drain is  in the right place. No need to repeat the urine culture. Sometimes Amphotericin can give fever so watch closely for that. Rule out aspiration pneumonia.  AKI on CKD patient started on dialysis but has been now intermittent.  Encephalopathy fluctuates  A. fib with RVR controlled on metoprolol  Acute hypoxic respiratory failure.  Was intubated earlier in the stay.  Remains extubated for more than 2 weeks now.  Morbid obesity with hypoventilation syndrome  Diabetes mellitus on insulin  Severe anemia GI bleed is questioned.  No intervention currently.  Has received PRBC.  Discussed the management with the care team.

## 2021-07-27 NOTE — Progress Notes (Signed)
Patient arousable and oriented x 3 this shift. Turned q 2 when patient agreeable. 1443ml UOP. 600 ml Flexi output. 1 unit PRBCs given. Another PIV obtained for antifungal and blood administration. Patient tolerated ice chips well. Bipap overnight and back on HHF around 0500. Tube feedings held while on bipap. PEG tube confirmed for 11am Friday in IR. Complete bed bath and linen change done. Sacral dressings changed.

## 2021-07-27 NOTE — Progress Notes (Signed)
PROGRESS NOTE    Heather Lopez  PFX:[id number] DOB: 02-Nov-1946 DOA: 06/27/2021 PCP: [name], PA-C   Chief complaint.  Shortness of breath. Brief Narrative:   Heather Lopez is a 75 y.o. female with history of hypertension, diabetes mellitus, hyperlipidemia has been feeling weak for the last 3 to 4 days.  Patient had a acute renal failure with creatinine of 9.3, potassium 7.5, acute metabolic acidosis with pH 7.15, procalcitonin 6.83.  Patient was diagnosed was septic shock, acute respiratory failure. On night of 8/4, patient developed acute abdominal pain, CT scan showed acute abdomen secondary to perforated duodenum.  Patient was placed on mechanical ventilation after duodenum repair. Patient had a bilateral hydronephrosis, ureteral stents was placed on 8/10. Patient urine culture was positive for Candida glabrata.  ID consult was obtained, patient currently on high-dose of Diflucan. 8/26. Due to persistent fever, CT scan of abdomen/pelvis was repeated on 8/26, aspiration pararenal fluids was obtained, removed 5 mL of pus.  ID changed antibiotic to Amphotericin in addition to Zosyn  8/31-Hg 6.9. discussed with pt and later with husband about transfusion, risk/complications,  and they would like to proceed. 9/1 Hg 8.2.  Per nursing patient was febrile at 101.4 this a.m. was given Tylenol.  Assessment & Plan:   Active Problems:   Uncontrolled type 2 diabetes mellitus with insulin therapy ([place])   Hyperlipidemia associated with type 2 diabetes mellitus ([place])   Type 2 diabetes mellitus with stage 4 chronic kidney disease, with long-term current use of insulin (HCC)   Acute respiratory failure with hypoxia (HCC)   Severe sepsis with septic shock (HCC)   Acute lower UTI   Acute kidney injury superimposed on chronic kidney disease (HCC)   Pneumoperitoneum   Pressure injury of skin   Atrial flutter with rapid ventricular response (HCC)   Endotracheally intubated   Bilateral nephrolithiasis    Anemia of chronic disease   Demand ischemia of myocardium (HCC)   Lethargy   Duodenal ulcer perforation ([place])   Renal abscess   Essential hypertension   Hypokalemia  Acute metabolic encephalopathy. Sepsis with septic shock secondary to obstructive uropathy, kidney stone.  Status post bilateral ureteral stents. Candida urinary tract infection Perirenal abscess. Perforated duodenum. S/p repair. Pararenal drain by IR only removed 5 mL, cultures so far has no growth.  Repeat ultrasound still has significant pararenal abscess. This thought to be secondary to fungal infection with Candida glabrata.  Patient is on Amphotericin per ID. Patient also s/p bilateral ureteral stents.  Last CT scan showed persistent bilateral hydronephrosis, urology reconsulted, believe this is due to urinary retention.  Foley catheter was anchored. As for patient duodenal perforation, patient had surgical repair, condition had 9/1- tolerating TF Per husband MS not as good as yesterday.  Replace lytes  Fever -pt had tmax 101.4 Will check blood cultures, urine culture, and chest x-ray ID following  Acute hypoxemic respiratory failure secondary to septic shock. Morbid obesity and obesity hypoventilation syndrome  Patient has persistent hypoxemia, she had increased oxygen requirement yesterday, she was using 90% of heated high flow.  She was started on IV Lasix, oxygen was down to 40%.  She most likely has acute on chronic diastolic congestive heart failure.  Her echo on 06/27/2021 showed ejection fraction 50 to 55%. 9/1 continue Lasix Wean down O2 as tolerated  Acute kidney injury on chronic kidney disease stage IV. Hypokalemia. Patient is followed by nephrology, initially dialyzed, she has not been requiring hemodialysis for the last few days.  9/1 creatinine down to 2.38 No acute indication for dialysis    Iron deficiency anemia Likely acute blood loss anemia. Possible GI bleed. Patient has been transfused,  also received IV iron.  No B12 deficiency.  She has  black stool from the rectal tube.  GI consult is obtained.  Continued on Protonix IV twice a day. GI is not considering EGD due to high risk of procedure and  patient has severe hypoxemia. 8/31 will transfuse 1 unit prbc today Monitor cbc   Uncontrolled type 2 diabetes with hyperglycemia BG stable Continue RISS   Dysphagia. Patient has significant dysphagia, has not been able to take any p.o.  Currently receiving tube feeding through NG tube.  Discussed with GI, family still request a PEG tube placement.  However, due to hypoxemia, the procedure was deemed to be high risk. Spoke with IR, PEG tube tomorrow.  DVT prophylaxis: Heparin Code Status: DNR Family Communication:husband at bedside Disposition Plan:    Status is: Inpatient  Remains inpatient appropriate because:IV treatments appropriate due to intensity of illness or inability to take PO and Inpatient level of care appropriate due to severity of illness  Dispo: The patient is from: Home              Anticipated d/c is to: LTAC              Patient currently is not medically stable to d/c.   Difficult to place patient No  Patient has been accepted to [facility] in [place].  Getting transfusion.      I/O last 3 completed shifts: In: 4707.2 [I.V.:358.6; Blood:370; NG/GT:2130; IV Piggyback:1848.6] Out: 7185 [Urine:6225; Drains:10; Stool:950] Total I/O In: 10 [I.V.:10] Out: -      Consultants:  Nephrology, ID  Procedures: Duodenum, bilateral ureteral stents.  Antimicrobials:  Amphotericin Subjective:  Pt states "fine" when I ask her how she is doing or if has sob  Objective: Vitals:   07/27/21 0500 07/27/21 0600 07/27/21 0700 07/27/21 0800  BP: (!) 164/57 (!) 153/103 (!) 144/91 (!) 158/57  Pulse: 94 (!) 101 (!) 103 (!) 108  Resp: (!) 26 (!) 24 (!) 25 (!) 24  Temp:    (!) 101.4 F (38.6 C)  TempSrc:    Oral  SpO2: 96% 96% 94% 93%  Weight:      Height:         Intake/Output Summary (Last 24 hours) at 07/27/2021 0928 Last data filed at 07/27/2021 0800 Gross per 24 hour  Intake 3152.2 ml  Output 3825 ml  Net -672.8 ml   Filed Weights   07/18/21 0500 07/22/21 0500 07/27/21 0325  Weight: 125.9 kg 125.9 kg (!) 151 kg    Examination:  Appears tired Anteriorly cta no rales Regular mildly tachy, s1/s2 no gallop Soft mildly distended positive bowel sounds +edema Mood and affect appropriate in current setting   Data Reviewed: I have personally reviewed following labs and imaging studies  CBC: Recent Labs  Lab 07/21/21 0528 07/23/21 0634 07/24/21 0629 07/25/21 0616 07/26/21 0618  WBC 15.4* 15.2* 15.5* 14.9* 14.4*  NEUTROABS 13.2* 11.3* 12.4* 12.2* 11.9*  HGB 7.4* 5.8* 7.2* 8.1* 6.9*  HCT 23.5* 18.6* 21.1* 24.6* 21.4*  MCV 85.8 87.3 86.8 87.5 89.9  PLT 303 274 225 252 505   Basic Metabolic Panel: Recent Labs  Lab 07/22/21 1739 07/23/21 0634 07/24/21 0629 07/24/21 1642 07/25/21 0616 07/26/21 0618 07/27/21 0649  NA  --  138 135  --  138 143 142  K 3.0*  3.1* 3.1* 3.7 3.4* 3.3* 3.3*  CL  --  100 101  --  101 106 105  CO2  --  25 23  --  22 24 23   GLUCOSE  --  178* 96  --  156* 114* 148*  BUN  --  60* 65*  --  70* 78* 85*  CREATININE  --  2.55* 2.78*  --  2.88* 2.64* 2.38*  CALCIUM  --  8.9 8.6*  --  8.9 9.0 9.1  MG 2.0 2.2 1.9  --  2.0 2.1  --   PHOS  --   --   --   --   --  5.0*  --    GFR: Estimated Creatinine Clearance: 29.6 mL/min (A) (by C-G formula based on SCr of 2.38 mg/dL (H)). Liver Function Tests: Recent Labs  Lab 07/21/21 0528  AST 33  ALT 53*  ALKPHOS 101  BILITOT 0.5  PROT 5.8*  ALBUMIN 1.6*   No results for input(s): LIPASE, AMYLASE in the last 168 hours. No results for input(s): AMMONIA in the last 168 hours. Coagulation Profile: No results for input(s): INR, PROTIME in the last 168 hours. Cardiac Enzymes: No results for input(s): CKTOTAL, CKMB, CKMBINDEX, TROPONINI in the last 168  hours. BNP (last 3 results) No results for input(s): PROBNP in the last 8760 hours. HbA1C: No results for input(s): HGBA1C in the last 72 hours. CBG: Recent Labs  Lab 07/26/21 1723 07/26/21 2004 07/26/21 2345 07/27/21 0336 07/27/21 0717  GLUCAP 150* 185* 196* 166* 132*   Lipid Profile: No results for input(s): CHOL, HDL, LDLCALC, TRIG, CHOLHDL, LDLDIRECT in the last 72 hours. Thyroid Function Tests: No results for input(s): TSH, T4TOTAL, FREET4, T3FREE, THYROIDAB in the last 72 hours. Anemia Panel: No results for input(s): VITAMINB12, FOLATE, FERRITIN, TIBC, IRON, RETICCTPCT in the last 72 hours. Sepsis Labs: No results for input(s): PROCALCITON, LATICACIDVEN in the last 168 hours.  Recent Results (from the past 240 hour(s))  Body fluid culture w Gram Stain     Status: None (Preliminary result)   Collection Time: 07/21/21  1:23 PM   Specimen: Peritoneal Washings; Body Fluid  Result Value Ref Range Status   Specimen Description   Final    PERITONEAL Performed at [facility], [address], [place], [place] [address]    Special Requests   Final    NONE Performed at [facility], [place] [place], [place] [address]    Gram Stain   Final    MODERATE WBC PRESENT,BOTH PMN AND MONONUCLEAR NO ORGANISMS SEEN    Culture   Final    RARE YEAST IDENTIFICATION AND SUSCEPTIBILITIES TO FOLLOW Performed at [facility], [place] [address], [place], [place] [address]    Report Status PENDING  Incomplete  Aerobic/Anaerobic Culture w Gram Stain (surgical/deep wound)     Status: None   Collection Time: 07/21/21  3:32 PM   Specimen: Abdomen  Result Value Ref Range Status   Specimen Description   Final    ABDOMEN Performed at [facility], [address], [place], [place] [address]    Special Requests   Final    NONE Performed at [facility], [place] [place],  [address]    Gram Stain   Final    MODERATE SQUAMOUS  EPITHELIAL CELLS PRESENT WBC PRESENT, PREDOMINANTLY PMN NO ORGANISMS SEEN    Culture   Final    RARE CANDIDA GLABRATA NO ANAEROBES ISOLATED Performed at [facility], [place] [address], [place], [place] [address]    Report Status 07/26/2021 FINAL  Final         Radiology Studies: No results found.      Scheduled Meds:  sodium chloride   Intravenous Once   sodium chloride   Intravenous Once   acetaminophen (TYLENOL) oral liquid 160 mg/5 mL  650 mg Per Tube Q24H   amLODipine  10 mg Per Tube Daily   chlorhexidine  15 mL Mouth Rinse BID   Chlorhexidine Gluconate Cloth  6 each Topical Q0600   cloNIDine  0.1 mg Per Tube Daily   collagenase   Topical Daily   dextrose  10 mL Intravenous Q24H   dextrose  10 mL Intravenous Q24H   [START ON 07/28/2021] epoetin (EPOGEN/PROCRIT) injection  20,000 Units Subcutaneous Weekly   free water  125 mL Per Tube Q4H   furosemide  80 mg Intravenous Q12H   heparin injection (subcutaneous)  5,000 Units Subcutaneous Q8H   insulin aspart  0-20 Units Subcutaneous Q4H   insulin aspart  6 Units Subcutaneous Q4H   insulin detemir  30 Units Subcutaneous BID   mouth rinse  15 mL Mouth Rinse q12n4p   metoprolol tartrate  100 mg Per Tube BID   multivitamin  1 tablet Per Tube QHS   nutrition supplement (JUVEN)  1 packet Per Tube BID BM   pantoprazole (PROTONIX) IV  40 mg Intravenous Q12H   sodium chloride  500 mL Intravenous Q24H   sodium chloride  500 mL Intravenous Q24H   sodium chloride flush  5 mL Intracatheter Q8H   Continuous Infusions:  sodium chloride 5 mL/hr at 07/27/21 0800   amphotericin B  Conventional (FUNGIZONE) IV Stopped (07/27/21 0314)   [START ON 07/28/2021]  ceFAZolin (ANCEF) IV     feeding supplement (GLUCERNA 1.5 CAL) 1,000 mL (07/24/21 1656)   feeding supplement (GLUCERNA 1.5 CAL)       LOS: 30 days    Time spent: 35 minutes with >50% on coc    Nolberto Hanlon, MD [facility]   To contact the attending provider  between 7A-7P or the covering provider during after hours 7P-7A, please log into the web site [email or web address] and access using universal [place] password for that web site. If you do not have the password, please call the hospital operator.  07/27/2021, 9:28 AM

## 2021-07-27 NOTE — Progress Notes (Signed)
Occupational Therapy Treatment Patient Details Name: Heather Lopez MRN: [id number] DOB: [birth year] Today's Date: 07/27/2021    History of present illness Pt is a [name] who presented to the ED on 06/27/21 with chief complaints of progressive shortness of breath, productive cough, loss of appetite, nausea, dry heaving fatigue, malaise and generalized body aches. Patient initially went to her PCP on 7/29 with complaints of rhinorrhea, harsh cough, loss of appetite, nausea, fatigue, malaise and generalized body aches x2 days, patient was diagnosed with pneumonia of which she received ceftriaxone 1 mg IM and sent home with Levaquin, prednisone and Tessalon. On 8/4 pt developed acute abdominal pain with CT showing viscus perforation. Pt urgently taken to the OR for ex lap. Pt is also s/p cystoscopy with B ureteral stent placement for L obstructed kidney stone on 8/10.   OT comments  Upon entering the room, pt's husband in room. Pt in chair position in bed and is very fatigued. Husband reports pt is having "a bad morning". Pt squeezes therapist hands and greets therapist by introducing herself. OT attemping A/AROM and PROM for B UEs but pt does not tolerate well and grimaces with task. Hand over hand assistance to wash face this session. She is able to wiggle toes but PROM for B ankle pumps and B prevalon boots donned. OT answering several questions pt's spouse had regarding pt's level of fatigue. Pt positioned for comfort and all needs within reach. OT recommendation continues to be LTACH at discharge.    Follow Up Recommendations  LTACH;Supervision/Assistance - 24 hour    Equipment Recommendations  Hospital bed       Precautions / Restrictions Precautions Precautions: Fall Precaution Comments: NG tube, abdominal incision & JP drains, foley catheter, fecal tube, L jugular tunneled hemodialysis catheter              ADL either performed or assessed with clinical judgement   ADL Overall ADL's :  Needs assistance/impaired                                       General ADL Comments: Pt needing total A hand over hand to wash face today. Total A to don prevalon boots.     Vision Patient Visual Report: No change from baseline            Cognition Arousal/Alertness: Lethargic Behavior During Therapy: Flat affect Overall Cognitive Status: Impaired/Different from baseline Area of Impairment: Orientation;Memory;Attention;Safety/judgement;Following commands;Awareness;Problem solving                 Orientation Level: Disoriented to;Time;Situation   Memory: Decreased short-term memory;Decreased recall of precautions Following Commands: Follows one step commands consistently;Follows one step commands with increased time Safety/Judgement: Decreased awareness of safety;Decreased awareness of deficits Awareness: Anticipatory;Intellectual;Emergent Problem Solving: Slow processing;Decreased initiation;Difficulty sequencing;Requires verbal cues;Requires tactile cues General Comments: Pt is very fatigued. Needs increased time to process and initiate. Follows 1 step commands 50% of time with increased time.                   Pertinent Vitals/ Pain       Pain Assessment: Faces Faces Pain Scale: Hurts even more Pain Location: B UEs Pain Descriptors / Indicators: Discomfort;Guarding;Grimacing Pain Intervention(s): Limited activity within patient's tolerance;Monitored during session         Frequency  Min 1X/week        Progress Toward Goals  OT  Goals(current goals can now be found in the care plan section)  Progress towards OT goals: Progressing toward goals  Acute Rehab OT Goals Patient Stated Goal: to go home OT Goal Formulation: With family Time For Goal Achievement: 08/03/21 Potential to Achieve Goals: [place] Discharge plan remains appropriate;Frequency remains appropriate       AM-PAC OT "6 Clicks" Daily Activity     Outcome  Measure   Help from another person eating meals?: Total Help from another person taking care of personal grooming?: Total Help from another person toileting, which includes using toliet, bedpan, or urinal?: Total Help from another person bathing (including washing, rinsing, drying)?: Total Help from another person to put on and taking off regular upper body clothing?: Total Help from another person to put on and taking off regular lower body clothing?: Total 6 Click Score: 6    End of Session Equipment Utilized During Treatment: Oxygen  OT Visit Diagnosis: Muscle weakness (generalized) (M62.81)   Activity Tolerance Patient limited by lethargy;Patient limited by fatigue   Patient Left in bed;with call bell/phone within reach;with family/visitor present;with bed alarm set           Time: 0931-1216 OT Time Calculation (min): 24 min  Charges: OT General Charges $OT Visit: 1 Visit OT Treatments $Self Care/Home Management : 8-22 mins $Therapeutic Activity: 8-22 mins  Darleen Crocker, MS, OTR/L , CBIS ascom [phone]  07/27/21, 11:19 AM

## 2021-07-28 ENCOUNTER — Other Ambulatory Visit: Payer: TRICARE For Life (TFL) | Admitting: Radiology

## 2021-07-28 ENCOUNTER — Inpatient Hospital Stay: Payer: Medicare Other

## 2021-07-28 DIAGNOSIS — N151 Renal and perinephric abscess: Secondary | ICD-10-CM | POA: Diagnosis not present

## 2021-07-28 LAB — BLOOD CULTURE ID PANEL (REFLEXED) - BCID2

## 2021-07-28 LAB — COMPREHENSIVE METABOLIC PANEL
ALT: 31 U/L (ref 0–44)
AST: 19 U/L (ref 15–41)
Albumin: 2 g/dL — ABNORMAL LOW (ref 3.5–5.0)
Alkaline Phosphatase: 98 U/L (ref 38–126)
Anion gap: 9 (ref 5–15)
BUN: 88 mg/dL — ABNORMAL HIGH (ref 8–23)
CO2: 28 mmol/L (ref 22–32)
Calcium: 9.1 mg/dL (ref 8.9–10.3)
Chloride: 107 mmol/L (ref 98–111)
Creatinine, Ser: 2.26 mg/dL — ABNORMAL HIGH (ref 0.44–1.00)
GFR, Estimated: 22 mL/min — ABNORMAL LOW (ref >60)
Glucose, Bld: 120 mg/dL — ABNORMAL HIGH (ref 70–99)
Potassium: 3.5 mmol/L (ref 3.5–5.1)
Sodium: 144 mmol/L (ref 135–145)
Total Bilirubin: 0.7 mg/dL (ref 0.3–1.2)
Total Protein: 6.7 g/dL (ref 6.5–8.1)

## 2021-07-28 LAB — CBC
HCT: 25 % — ABNORMAL LOW (ref 36.0–46.0)
Hemoglobin: 7.8 g/dL — ABNORMAL LOW (ref 12.0–15.0)
MCH: 27.9 pg (ref 26.0–34.0)
MCHC: 31.2 g/dL (ref 30.0–36.0)
MCV: 89.3 fL (ref 80.0–100.0)
Platelets: 355 10*3/uL (ref 150–400)
RBC: 2.8 MIL/uL — ABNORMAL LOW (ref 3.87–5.11)
RDW: 16.1 % — ABNORMAL HIGH (ref 11.5–15.5)
WBC: 11.2 10*3/uL — ABNORMAL HIGH (ref 4.0–10.5)
nRBC: 0 % (ref 0.0–0.2)

## 2021-07-28 LAB — GLUCOSE, CAPILLARY
Glucose-Capillary: 103 mg/dL — ABNORMAL HIGH (ref 70–99)
Glucose-Capillary: 124 mg/dL — ABNORMAL HIGH (ref 70–99)
Glucose-Capillary: 136 mg/dL — ABNORMAL HIGH (ref 70–99)
Glucose-Capillary: 149 mg/dL — ABNORMAL HIGH (ref 70–99)
Glucose-Capillary: 170 mg/dL — ABNORMAL HIGH (ref 70–99)
Glucose-Capillary: 199 mg/dL — ABNORMAL HIGH (ref 70–99)
Glucose-Capillary: 223 mg/dL — ABNORMAL HIGH (ref 70–99)

## 2021-07-28 LAB — PHOSPHORUS: Phosphorus: 4.5 mg/dL (ref 2.5–4.6)

## 2021-07-28 LAB — AMMONIA: Ammonia: 11 umol/L (ref 9–35)

## 2021-07-28 LAB — MAGNESIUM: Magnesium: 2 mg/dL (ref 1.7–2.4)

## 2021-07-28 MED ORDER — HALOPERIDOL LACTATE 5 MG/ML IJ SOLN: 1.0000 mg | Freq: Once | INTRAMUSCULAR | Status: DC

## 2021-07-28 MED ORDER — VANCOMYCIN HCL 1750 MG/350ML IV SOLN
1750.0000 mg | INTRAVENOUS | Status: DC
  Administered 2021-07-28: 1750 mg via INTRAVENOUS
  Filled 2021-07-28 (×2): qty 350

## 2021-07-28 NOTE — Progress Notes (Signed)
PROGRESS NOTE    Heather Lopez  VQM:[id number] DOB: 01/25/1946 DOA: 06/27/2021 PCP: [name], PA-C   Chief complaint.  Shortness of breath. Brief Narrative:   Heather Lopez is a 75 y.o. female with history of hypertension, diabetes mellitus, hyperlipidemia has been feeling weak for the last 3 to 4 days.  Patient had a acute renal failure with creatinine of 9.3, potassium 7.5, acute metabolic acidosis with pH 7.15, procalcitonin 6.83.  Patient was diagnosed was septic shock, acute respiratory failure. On night of 8/4, patient developed acute abdominal pain, CT scan showed acute abdomen secondary to perforated duodenum.  Patient was placed on mechanical ventilation after duodenum repair. Patient had a bilateral hydronephrosis, ureteral stents was placed on 8/10. Patient urine culture was positive for Candida glabrata.  ID consult was obtained, patient currently on high-dose of Diflucan. 8/26. Due to persistent fever, CT scan of abdomen/pelvis was repeated on 8/26, aspiration pararenal fluids was obtained, removed 5 mL of pus.  ID changed antibiotic to Amphotericin in addition to Zosyn  8/31-Hg 6.9. discussed with pt and later with husband about transfusion, risk/complications,  and they would like to proceed. 9/1 Hg 8.2.  Per nursing patient was febrile at 101.4 this a.m. was given Tylenol. 8/2- husband at bedside. No issues reported. Reports she is conversating .  In IR for PEG but unable to get to her today, plan for peg placement on Tuesday morning.  Assessment & Plan:   Active Problems:   Uncontrolled type 2 diabetes mellitus with insulin therapy ([place])   Hyperlipidemia associated with type 2 diabetes mellitus ([place])   Type 2 diabetes mellitus with stage 4 chronic kidney disease, with long-term current use of insulin (HCC)   Acute respiratory failure with hypoxia (HCC)   Severe sepsis with septic shock (HCC)   Acute lower UTI   Acute kidney injury superimposed on chronic kidney disease  (HCC)   Pneumoperitoneum   Pressure injury of skin   Atrial flutter with rapid ventricular response (HCC)   Endotracheally intubated   Bilateral nephrolithiasis   Anemia of chronic disease   Demand ischemia of myocardium (HCC)   Lethargy   Duodenal ulcer perforation ([place])   Renal abscess   Essential hypertension   Hypokalemia  Acute metabolic encephalopathy. Sepsis with septic shock secondary to obstructive uropathy, kidney stone.  Status post bilateral ureteral stents. Candida urinary tract infection Perirenal abscess. Perforated duodenum. S/p repair. Pararenal drain by IR only removed 5 mL, cultures so far has no growth.  Repeat ultrasound still has significant pararenal abscess. This thought to be secondary to fungal infection with Candida glabrata.  Patient is on Amphotericin per ID. Patient also s/p bilateral ureteral stents.  Last CT scan showed persistent bilateral hydronephrosis, urology reconsulted, believe this is due to urinary retention.  Foley catheter was anchored. As for patient duodenal perforation, patient had surgical repair, condition had 9/1- tolerating TF Per husband MS not as good as yesterday.  9/2 unable to do PEG placement by IR today due to scheduling.  Plan is to do it on Tuesday.  N.p.o. Monday night  Fever -on 9/1 Checks x-ray cannot rule out pna ID following    Acute hypoxemic respiratory failure secondary to septic shock. Morbid obesity and obesity hypoventilation syndrome  Patient has persistent hypoxemia, she had increased oxygen requirement yesterday, she was using 90% of heated high flow.  She was started on IV Lasix, oxygen was down to 40%.  She most likely has acute on chronic diastolic congestive  heart failure.  Her echo on 06/27/2021 showed ejection fraction 50 to 55%. 9/1 continue Lasix Wean down O2 as tolerated  Acute kidney injury on chronic kidney disease stage IV. Hypokalemia. Patient is followed by nephrology, initially dialyzed,  she has not been requiring hemodialysis for the last few days. 9/2 creatinine down to 2.26 No acute indication for hemodialysis     Iron deficiency anemia Likely acute blood loss anemia. Possible GI bleed. Patient has been transfused, also received IV iron.  No B12 deficiency.  She has  black stool from the rectal tube.  GI consult is obtained.  Continued on Protonix IV twice a day. GI is not considering EGD due to high risk of procedure and  patient has severe hypoxemia. 8/31 will transfuse 1 unit prbc today Monitor cbc   Uncontrolled type 2 diabetes with hyperglycemia BG stable Continue RISS   Dysphagia. Patient has significant dysphagia, has not been able to take any p.o.  Currently receiving tube feeding through NG tube.  Discussed with GI, family still request a PEG tube placement.  However, due to hypoxemia, the procedure was deemed to be high risk. Spoke with IR, PEG tube tomorrow.  DVT prophylaxis: Heparin Code Status: DNR Family Communication:husband at bedside Disposition Plan:    Status is: Inpatient  Remains inpatient appropriate because:IV treatments appropriate due to intensity of illness or inability to take PO and Inpatient level of care appropriate due to severity of illness  Dispo: The patient is from: Home              Anticipated d/c is to: LTAC              Patient currently is not medically stable to d/c.   Difficult to place patient No  Patient has been accepted to [facility] in [place].  Getting transfusion.      I/O last 3 completed shifts: In: 4960.7 [I.V.:475.5; Blood:370; NG/GT:1640; IV Piggyback:2475.2] Out: 4401 [Urine:5025; Stool:825] No intake/output data recorded.     Consultants:  Nephrology, ID  Procedures: Duodenum, bilateral ureteral stents.  Antimicrobials:  Amphotericin Subjective: No shortness of breath, chest pain or abdominal pain  Objective: Vitals:   07/28/21 0400 07/28/21 0500 07/28/21 0600 07/28/21 0700  BP:  (!) 167/56 (!) 140/56 (!) 140/57 (!) 140/59  Pulse: 99 93 90 94  Resp: (!) 25 20 18  (!) 22  Temp:      TempSrc:      SpO2: 98% 97% 97% 99%  Weight:      Height:        Intake/Output Summary (Last 24 hours) at 07/28/2021 0847 Last data filed at 07/28/2021 0519 Gross per 24 hour  Intake 2886.93 ml  Output 3300 ml  Net -413.07 ml   Filed Weights   07/18/21 0500 07/22/21 0500 07/27/21 0325  Weight: 125.9 kg 125.9 kg (!) 151 kg    Examination:  Sleepy, husband at bedside, NAD Tube feeding in place Soft benign mildly distended positive bowel sounds Trace edema Mood and affect appropriate in current setting   Data Reviewed: I have personally reviewed following labs and imaging studies  CBC: Recent Labs  Lab 07/23/21 0634 07/24/21 0629 07/25/21 0616 07/26/21 0618 07/27/21 0950 07/28/21 0602  WBC 15.2* 15.5* 14.9* 14.4* 13.3* 11.2*  NEUTROABS 11.3* 12.4* 12.2* 11.9*  --   --   HGB 5.8* 7.2* 8.1* 6.9* 8.2* 7.8*  HCT 18.6* 21.1* 24.6* 21.4* 25.2* 25.0*  MCV 87.3 86.8 87.5 89.9 90.0 89.3  PLT 274 225 252 247  321 185   Basic Metabolic Panel: Recent Labs  Lab 07/23/21 0634 07/24/21 0629 07/24/21 1642 07/25/21 0616 07/26/21 0618 07/27/21 0649 07/28/21 0602  NA 138 135  --  138 143 142 144  K 3.1* 3.1* 3.7 3.4* 3.3* 3.3* 3.5  CL 100 101  --  101 106 105 107  CO2 25 23  --  22 24 23 28   GLUCOSE 178* 96  --  156* 114* 148* 120*  BUN 60* 65*  --  70* 78* 85* 88*  CREATININE 2.55* 2.78*  --  2.88* 2.64* 2.38* 2.26*  CALCIUM 8.9 8.6*  --  8.9 9.0 9.1 9.1  MG 2.2 1.9  --  2.0 2.1  --  2.0  PHOS  --   --   --   --  5.0*  --  4.5   GFR: Estimated Creatinine Clearance: 31.2 mL/min (A) (by C-G formula based on SCr of 2.26 mg/dL (H)). Liver Function Tests: Recent Labs  Lab 07/28/21 0602  AST 19  ALT 31  ALKPHOS 98  BILITOT 0.7  PROT 6.7  ALBUMIN 2.0*   No results for input(s): LIPASE, AMYLASE in the last 168 hours. Recent Labs  Lab 07/28/21 0602  AMMONIA 11    Coagulation Profile: No results for input(s): INR, PROTIME in the last 168 hours. Cardiac Enzymes: No results for input(s): CKTOTAL, CKMB, CKMBINDEX, TROPONINI in the last 168 hours. BNP (last 3 results) No results for input(s): PROBNP in the last 8760 hours. HbA1C: No results for input(s): HGBA1C in the last 72 hours. CBG: Recent Labs  Lab 07/27/21 1928 07/27/21 2026 07/28/21 0018 07/28/21 0355 07/28/21 0729  GLUCAP 140* 118* 136* 124* 103*   Lipid Profile: No results for input(s): CHOL, HDL, LDLCALC, TRIG, CHOLHDL, LDLDIRECT in the last 72 hours. Thyroid Function Tests: No results for input(s): TSH, T4TOTAL, FREET4, T3FREE, THYROIDAB in the last 72 hours. Anemia Panel: No results for input(s): VITAMINB12, FOLATE, FERRITIN, TIBC, IRON, RETICCTPCT in the last 72 hours. Sepsis Labs: No results for input(s): PROCALCITON, LATICACIDVEN in the last 168 hours.  Recent Results (from the past 240 hour(s))  Body fluid culture w Gram Stain     Status: None (Preliminary result)   Collection Time: 07/21/21  1:23 PM   Specimen: Peritoneal Washings; Body Fluid  Result Value Ref Range Status   Specimen Description   Final    PERITONEAL Performed at [facility], [address] [place], [place] [address]    Special Requests   Final    NONE Performed at [facility], [address] [place], [place] [address]    Gram Stain   Final    MODERATE WBC PRESENT,BOTH PMN AND MONONUCLEAR NO ORGANISMS SEEN    Culture   Final    RARE CANDIDA GLABRATA Sent to [place] for further susceptibility testing. Performed at [facility], [place] [address] [place], [place] [address]    Report Status PENDING  Incomplete  Aerobic/Anaerobic Culture w Gram Stain (surgical/deep wound)     Status: None   Collection Time: 07/21/21  3:32 PM   Specimen: Abdomen  Result Value Ref Range Status   Specimen Description   Final    ABDOMEN Performed at [facility], [address], [place], [place] [address]    Special Requests   Final    NONE Performed at [facility], [place], [place] [address]    Gram Stain   Final    MODERATE SQUAMOUS  EPITHELIAL CELLS PRESENT WBC PRESENT, PREDOMINANTLY PMN NO ORGANISMS SEEN    Culture   Final    RARE CANDIDA GLABRATA NO ANAEROBES ISOLATED Performed at [facility], [address], [place], [place] [address]    Report Status 07/26/2021 FINAL  Final  CULTURE, BLOOD (ROUTINE X 2) w Reflex to ID Panel     Status: None (Preliminary result)   Collection Time: 07/27/21  4:55 PM   Specimen: BLOOD  Result Value Ref Range Status   Specimen Description BLOOD BLOOD LEFT HAND  Final   Special Requests   Final    BOTTLES DRAWN AEROBIC AND ANAEROBIC Blood Culture results may not be optimal due to an inadequate volume of blood received in culture bottles   Culture   Final    NO GROWTH < 24 HOURS Performed at [facility], [address], [place], [place] [address]    Report Status PENDING  Incomplete  CULTURE, BLOOD (ROUTINE X 2) w Reflex to ID Panel     Status: None (Preliminary result)   Collection Time: 07/27/21  4:56 PM   Specimen: BLOOD  Result Value Ref Range Status   Specimen Description BLOOD BLOOD RIGHT HAND  Final   Special Requests   Final    BOTTLES DRAWN AEROBIC AND ANAEROBIC Blood Culture adequate volume   Culture   Final    NO GROWTH < 24 HOURS Performed at [facility], [address] [place], [place] [address]    Report Status PENDING  Incomplete         Radiology Studies: DG Chest Port 1 View  Result Date: 07/27/2021 CLINICAL DATA:  Fever. EXAM: PORTABLE CHEST 1 VIEW COMPARISON:  Chest radiograph dated 07/24/2021. FINDINGS: Left-sided dialysis catheter in similar position. Enteric tube extends below the diaphragm with tip beyond the inferior margin of the image. There is cardiomegaly with vascular congestion and mild edema, similar or  slightly progressed since the prior radiograph. Pneumonia is not excluded clinical correlation is recommended. No large pleural effusions. No pneumothorax. No acute osseous pathology. IMPRESSION: Cardiomegaly with vascular congestion and mild edema, similar or slightly progressed since the prior radiograph. Pneumonia is not excluded. Electronically Signed   By: Anner Crete M.D.   On: 07/27/2021 20:58        Scheduled Meds:  sodium chloride   Intravenous Once   sodium chloride   Intravenous Once   acetaminophen (TYLENOL) oral liquid 160 mg/5 mL  650 mg Per Tube Q24H   amLODipine  10 mg Per Tube Daily   chlorhexidine  15 mL Mouth Rinse BID   Chlorhexidine Gluconate Cloth  6 each Topical Q0600   cloNIDine  0.1 mg Per Tube Daily   collagenase   Topical Daily   dextrose  10 mL Intravenous Q24H   dextrose  10 mL Intravenous Q24H   epoetin (EPOGEN/PROCRIT) injection  20,000 Units Subcutaneous Weekly   free water  125 mL Per Tube Q4H   furosemide  80 mg Intravenous Q12H   haloperidol lactate  1 mg Intravenous Once   heparin injection (subcutaneous)  5,000 Units Subcutaneous Q8H   insulin aspart  0-20 Units Subcutaneous Q4H   insulin aspart  6 Units Subcutaneous Q4H   insulin detemir  30 Units Subcutaneous BID   mouth rinse  15 mL Mouth Rinse q12n4p   metoprolol tartrate  100 mg Per Tube BID   multivitamin  1 tablet Per Tube QHS   nutrition supplement (JUVEN)  1 packet Per Tube  BID BM   pantoprazole (PROTONIX) IV  40 mg Intravenous Q12H   sodium chloride  500 mL Intravenous Q24H   sodium chloride  500 mL Intravenous Q24H   sodium chloride flush  5 mL Intracatheter Q8H   Continuous Infusions:  sodium chloride 5 mL/hr at 07/28/21 0400   amphotericin B  Conventional (FUNGIZONE) IV Stopped (07/28/21 0309)    ceFAZolin (ANCEF) IV     feeding supplement (GLUCERNA 1.5 CAL) 1,000 mL (07/24/21 1656)   feeding supplement (GLUCERNA 1.5 CAL)       LOS: 31 days    Time spent: 35 minutes  with >50% on coc    Nolberto Hanlon, MD [facility]   To contact the attending provider between 7A-7P or the covering provider during after hours 7P-7A, please log into the web site [email or web address] and access using universal [place] password for that web site. If you do not have the password, please call the hospital operator.  07/28/2021, 8:47 AM

## 2021-07-28 NOTE — Progress Notes (Signed)
Blood cultures positive in 1/4 bottles for staph epi. Initially thought that it was a contaminant, but with recent fever, added vanc (resistance was noted). Also repeated blood cultures - with note to be done before vanc is started - so assess if it was a true positive or if it was contaminant.

## 2021-07-28 NOTE — Progress Notes (Signed)
Nutrition Follow-up  DOCUMENTATION CODES:   Obesity unspecified  INTERVENTION:   Once G-tube in place and ready for use:  Continue Glucerna 1.5 '@60ml' /hr   Free water flushes 170m q4 hours   Regimen provides 2160kcal/day, 119g/day protein and 1843 ml/day free water   Rean-vite po daily   Juven Fruit Punch BID via tube, each serving provides 95kcal and 2.5g of protein (amino acids glutamine and arginine)  NUTRITION DIAGNOSIS:   Increased nutrient needs related to acute illness, post-op healing (sepsis, ARF on HD) as evidenced by estimated needs.  GOAL:   Patient will meet greater than or equal to 90% of their needs -met with tube feeds   MONITOR:   Labs, Weight trends, TF tolerance, Skin, I & O's  ASSESSMENT:   75y.o. year old female with PMH of diabetes, CKD III, nephrolithiasis with chronic right hydroureteronephrosis, HLD, depression/anxiety, IBS and gout who is admitted with PNA, ARF and perforated duodenal ulcer now s/p exploratory laparotomy and primary repair of perforated duodenal ulcer with creation of omental flap 8/5.  8/18- s/p tunneled HD catheter  8/26- CT guided aspiration of perirenal abscess- 5 ml purulent fluid aspirated  Pt continues to tolerate tube feeds well at goal rate. Pt currently NPO for IR G-tube placement today. Refeed labs stable. Pt being followed by SLP. Per chart, pt documented to be up ~97lbs since admit; RD unsure if bed weights are correct. Pt +15.0L on her I & Os. Pt is noted to have edema.   Medications reviewed and include: epogen, lasix, heparin, insulin, rena-vit, juven, protonix, fungizone, cefazolin  Labs reviewed: K 3.5 wnl, BUN 88(H), creat 2.26(H), P 4.5 wnl, Mg 2.0 wnl Wbc- 11.2(H), Hgb 7.8(L), Hct 25.0(L) Cbgs- 136, 124, 103, 149 x 24 hrs  Diet Order:   Diet Order             Diet NPO time specified  Diet effective midnight                  EDUCATION NEEDS:   Not appropriate for education at this  time  Skin:  Skin Assessment: Reviewed RN Assessment (Beneath both breasts and between 4 cm x 32 cmx 0.1 cm,   Sacrum:  2 cmx 2 cm x 0.2 cm, incision abdomen)  Last BM:  9/1- 225 ml  Height:   Ht Readings from Last 1 Encounters:  07/11/21 5' 2.01" (1.575 m)    Weight:   Wt Readings from Last 1 Encounters:  07/27/21 (!) 151 kg    Ideal Body Weight:  50 kg  BMI:  Body mass index is 60.85 kg/m.  Estimated Nutritional Needs:   Kcal:  2100-2400 kcal/day  Protein:  110-120 g/day  Fluid:  1.5-1.8L/day  CKoleen DistanceMS, RD, LDN Please refer to [facility]for RD and/or RD on-call/weekend/after hours pager

## 2021-07-28 NOTE — Progress Notes (Signed)
Physical Therapy Treatment Patient Details Name: Heather Lopez MRN: [id number] DOB: 13-Feb-1946 Today's Date: 07/28/2021    History of Present Illness Pt is a [name] who presented to the ED on 06/27/21 with chief complaints of progressive shortness of breath, productive cough, loss of appetite, nausea, dry heaving fatigue, malaise and generalized body aches. Patient initially went to her PCP on 7/29 with complaints of rhinorrhea, harsh cough, loss of appetite, nausea, fatigue, malaise and generalized body aches x2 days, patient was diagnosed with pneumonia of which she received ceftriaxone 1 mg IM and sent home with Levaquin, prednisone and Tessalon. On 8/4 pt developed acute abdominal pain with CT showing viscus perforation. Pt urgently taken to the OR for ex lap. Pt is also s/p cystoscopy with B ureteral stent placement for L obstructed kidney stone on 8/10.    PT Comments    Pt awaiting transport for PEG placement, therefore seen for bedside ROM and stretching.  Pt c/o R LE (?hip) discomfort with ROM and resistive to flexion. Husband stated pt was awaiting THA when she got sick.  Will continue to progress as tolerated. Continue per POC.   Follow Up Recommendations  LTACH;Supervision/Assistance - 24 hour     Equipment Recommendations  None recommended by PT    Recommendations for Other Services       Precautions / Restrictions Precautions Precautions: Fall Precaution Comments: NG tube, abdominal incision & JP drains, foley catheter, fecal tube, L jugular tunneled hemodialysis catheter (Awaiting PEG placement 9/2)    Mobility  Bed Mobility Overal bed mobility: Needs Assistance Bed Mobility:  (Repositioned LE's)                Transfers                    Ambulation/Gait                 Stairs             Wheelchair Mobility    Modified Rankin (Stroke Patients Only)       Balance                                             Cognition Arousal/Alertness: Lethargic Behavior During Therapy: Flat affect Overall Cognitive Status: Impaired/Different from baseline Area of Impairment: Orientation;Memory;Attention;Safety/judgement;Following commands;Awareness;Problem solving                 Orientation Level: Disoriented to;Time;Situation Current Attention Level: Alternating   Following Commands: Follows one step commands consistently;Follows one step commands with increased time       General Comments:  (Pt fatigued, falling asleep/eyes closed through majority of session)      Exercises General Exercises - Lower Extremity Ankle Circles/Pumps: AAROM;PROM;Both;20 reps Short Arc Quad: AAROM;PROM;Both;20 reps Heel Slides: PROM;AAROM;Both;10 reps Hip ABduction/ADduction: AAROM;PROM;Both;15 reps Other Exercises Other Exercises:  (B passive heelcord stretches)    General Comments        Pertinent Vitals/Pain Pain Assessment: No/denies pain Pain Score: 5  Pain Location:  (R LE) Pain Descriptors / Indicators: Discomfort;Grimacing Pain Intervention(s): Limited activity within patient's tolerance;Monitored during session    Home Living                      Prior Function            PT Goals (current goals  can now be found in the care plan section) Acute Rehab PT Goals Patient Stated Goal: to go home    Frequency    Min 2X/week      PT Plan Discharge plan needs to be updated    Co-evaluation              AM-PAC PT "6 Clicks" Mobility   Outcome Measure  Help needed turning from your back to your side while in a flat bed without using bedrails?: Total Help needed moving from lying on your back to sitting on the side of a flat bed without using bedrails?: Total Help needed moving to and from a bed to a chair (including a wheelchair)?: Total Help needed standing up from a chair using your arms (e.g., wheelchair or bedside chair)?: Total Help needed to walk in hospital  room?: Total Help needed climbing 3-5 steps with a railing? : Total 6 Click Score: 6    End of Session Equipment Utilized During Treatment: Oxygen Activity Tolerance: Patient limited by fatigue Patient left: in bed;with call bell/phone within reach;with bed alarm set;with family/visitor present Nurse Communication:  (treatment session) PT Visit Diagnosis: Muscle weakness (generalized) (M62.81);Difficulty in walking, not elsewhere classified (R26.2)     Time: 4497-5300 PT Time Calculation (min) (ACUTE ONLY): 14 min  Charges:  $Therapeutic Exercise: 8-22 mins                    Mikel Cella, PTA    Josie Dixon 07/28/2021, 12:51 PM

## 2021-07-28 NOTE — Progress Notes (Signed)
[facility]  ROUNDING NOTE   Subjective:   Heather Lopez is a 75 y.o. white female who was admitted for community acquired pneumonia on 06/27/2021     Hospital course complicated by duodenal perforation s/p repair on 06/30/2021 Left obstructive hydronephrosis and renal abscess, Patient underwent cystoscopy and b/l ureteral stent placement 8/10 8/26- CT guided aspiration of perirenal abscess- 5 ml purulent fluid aspirated Cultures neg so far  UOP has improved with foley placement Lab Results  Component Value Date   CREATININE 2.26 (H) 07/28/2021   CREATININE 2.38 (H) 07/27/2021   CREATININE 2.64 (H) 07/26/2021   09/01 0701 - 09/02 0700 In: 2896.9 [I.V.:341.9; NG/GT:1155; IV Piggyback:1400] Out: 3325 [Urine:3100; Stool:225]   Continues to have low-grade fever of 99 Tube feeds Nepro 60 cc/h via NG tube High flow Nasal cannula 40 L/min oxygen supplementation, 45 % Lethargic but following simple commands Temperature now 98.6 Getting renal ultrasound this morning   Objective:  Vital signs in last 24 hours:  Temp:  [98.6 F (37 C)-101 F (38.3 C)] 98.6 F (37 C) (09/02 0321) Pulse Rate:  [89-118] 94 (09/02 0700) Resp:  [18-32] 22 (09/02 0700) BP: (130-218)/(49-183) 140/59 (09/02 0700) SpO2:  [89 %-99 %] 99 % (09/02 0700) FiO2 (%):  [45 %-70 %] 45 % (09/02 0817)  Weight change:  Filed Weights   07/18/21 0500 07/22/21 0500 07/27/21 0325  Weight: 125.9 kg 125.9 kg (!) 151 kg    Intake/Output: I/O last 3 completed shifts: In: 4960.7 [I.V.:475.5; Blood:370; NG/GT:1640; IV Piggyback:2475.2] Out: 6789 [Urine:5025; Stool:825]   Intake/Output this shift:  No intake/output data recorded.  Physical Exam: General: Critically ill  Head: HFNC O2, NGT in place  Eyes: Anicteric  Lungs:  +coarse breath sounds  Heart: tachycardia  Abdomen:  midline incision  Extremities:  + peripheral edema   Neurologic: Lethargic but following commands  GU Foley with yellow urine   Access:  LIJ permcath 8/18  Rectal tube  Basic Metabolic Panel: Recent Labs  Lab 07/23/21 0634 07/24/21 0629 07/24/21 1642 07/25/21 0616 07/26/21 0618 07/27/21 0649 07/28/21 0602  NA 138 135  --  138 143 142 144  K 3.1* 3.1* 3.7 3.4* 3.3* 3.3* 3.5  CL 100 101  --  101 106 105 107  CO2 25 23  --  22 24 23 28   GLUCOSE 178* 96  --  156* 114* 148* 120*  BUN 60* 65*  --  70* 78* 85* 88*  CREATININE 2.55* 2.78*  --  2.88* 2.64* 2.38* 2.26*  CALCIUM 8.9 8.6*  --  8.9 9.0 9.1 9.1  MG 2.2 1.9  --  2.0 2.1  --  2.0  PHOS  --   --   --   --  5.0*  --  4.5     Liver Function Tests: Recent Labs  Lab 07/28/21 0602  AST 19  ALT 31  ALKPHOS 98  BILITOT 0.7  PROT 6.7  ALBUMIN 2.0*    No results for input(s): LIPASE, AMYLASE in the last 168 hours. Recent Labs  Lab 07/28/21 0602  AMMONIA 11    CBC: Recent Labs  Lab 07/23/21 0634 07/24/21 0629 07/25/21 0616 07/26/21 0618 07/27/21 0950 07/28/21 0602  WBC 15.2* 15.5* 14.9* 14.4* 13.3* 11.2*  NEUTROABS 11.3* 12.4* 12.2* 11.9*  --   --   HGB 5.8* 7.2* 8.1* 6.9* 8.2* 7.8*  HCT 18.6* 21.1* 24.6* 21.4* 25.2* 25.0*  MCV 87.3 86.8 87.5 89.9 90.0 89.3  PLT 274 225 252  247 321 355     Cardiac Enzymes: No results for input(s): CKTOTAL, CKMB, CKMBINDEX, TROPONINI in the last 168 hours.  BNP: Invalid input(s): POCBNP  CBG: Recent Labs  Lab 07/27/21 1928 07/27/21 2026 07/28/21 0018 07/28/21 0355 07/28/21 0729  GLUCAP 140* 118* 136* 124* 103*     Microbiology: Results for orders placed or performed during the hospital encounter of 06/27/21  Resp Panel by RT-PCR (Flu A&B, Covid) Nasopharyngeal Swab     Status: None   Collection Time: 06/27/21 12:32 AM   Specimen: Nasopharyngeal Swab; Nasopharyngeal(NP) swabs in vial transport medium  Result Value Ref Range Status   SARS Coronavirus 2 by RT PCR NEGATIVE NEGATIVE Final    Comment: (NOTE) SARS-CoV-2 target nucleic acids are NOT DETECTED.  The SARS-CoV-2 RNA is  generally detectable in upper respiratory specimens during the acute phase of infection. The lowest concentration of SARS-CoV-2 viral copies this assay can detect is 138 copies/mL. A negative result does not preclude SARS-Cov-2 infection and should not be used as the sole basis for treatment or other patient management decisions. A negative result may occur with  improper specimen collection/handling, submission of specimen other than nasopharyngeal swab, presence of viral mutation(s) within the areas targeted by this assay, and inadequate number of viral copies(<138 copies/mL). A negative result must be combined with clinical observations, patient history, and epidemiological information. The expected result is Negative.  Fact Sheet for Patients:  EntrepreneurPulse.com.au  Fact Sheet for Healthcare Providers:  IncredibleEmployment.be  This test is no t yet approved or cleared by the [place] FDA and  has been authorized for detection and/or diagnosis of SARS-CoV-2 by FDA under an Emergency Use Authorization (EUA). This EUA will remain  in effect (meaning this test can be used) for the duration of the COVID-19 declaration under Section 564(b)(1) of the Act, 21 U.S.C.section 360bbb-3(b)(1), unless the authorization is terminated  or revoked sooner.       Influenza A by PCR NEGATIVE NEGATIVE Final   Influenza B by PCR NEGATIVE NEGATIVE Final    Comment: (NOTE) The Xpert Xpress SARS-CoV-2/FLU/RSV plus assay is intended as an aid in the diagnosis of influenza from Nasopharyngeal swab specimens and should not be used as a sole basis for treatment. Nasal washings and aspirates are unacceptable for Xpert Xpress SARS-CoV-2/FLU/RSV testing.  Fact Sheet for Patients: EntrepreneurPulse.com.au  Fact Sheet for Healthcare Providers: IncredibleEmployment.be  This test is not yet approved or cleared by the [place] FDA and has been authorized for detection and/or diagnosis of SARS-CoV-2 by FDA under an Emergency Use Authorization (EUA). This EUA will remain in effect (meaning this test can be used) for the duration of the COVID-19 declaration under Section 564(b)(1) of the Act, 21 U.S.C. section 360bbb-3(b)(1), unless the authorization is terminated or revoked.  Performed at [facility], [address] [place], [place] [address]   Blood culture (routine x 2)     Status: None   Collection Time: 06/27/21 12:43 AM   Specimen: BLOOD  Result Value Ref Range Status   Specimen Description BLOOD RIGHT ASSIST CONTROL  Final   Special Requests   Final    BOTTLES DRAWN AEROBIC AND ANAEROBIC Blood Culture results may not be optimal due to an inadequate volume of blood received in culture bottles   Culture   Final    NO GROWTH 5 DAYS Performed at [facility], [address], [place], [place] [address]    Report Status 07/02/2021 FINAL  Final  Blood  culture (routine x 2)     Status: None   Collection Time: 06/27/21  1:45 AM   Specimen: BLOOD  Result Value Ref Range Status   Specimen Description BLOOD RIGHT ASSIST CONTROL  Final   Special Requests   Final    BOTTLES DRAWN AEROBIC AND ANAEROBIC Blood Culture adequate volume   Culture   Final    NO GROWTH 5 DAYS Performed at [facility], [address] [place], [place] [address]    Report Status 07/02/2021 FINAL  Final  Urine Culture     Status: Abnormal   Collection Time: 06/27/21  2:39 AM   Specimen: In/Out Cath Urine  Result Value Ref Range Status   Specimen Description   Final    IN/OUT CATH URINE Performed at [facility], [address], [place], [place] [address]    Special Requests   Final    NONE Performed at [facility], [address] [place], [place] [address]    Culture 30,000 COLONIES/mL YEAST (A)  Final   Report Status 06/28/2021 FINAL  Final  MRSA Next Gen by PCR,  Nasal     Status: None   Collection Time: 06/30/21  5:01 AM   Specimen: Nasal Mucosa; Nasal Swab  Result Value Ref Range Status   MRSA by PCR Next Gen NOT DETECTED NOT DETECTED Final    Comment: (NOTE) The GeneXpert MRSA Assay (FDA approved for NASAL specimens only), is one component of a comprehensive MRSA colonization surveillance program. It is not intended to diagnose MRSA infection nor to guide or monitor treatment for MRSA infections. Test performance is not FDA approved in patients less than 67 years old. Performed at [facility], [address], [place], [place] [address]   Urine Culture     Status: Abnormal   Collection Time: 07/05/21 10:36 AM   Specimen: PATH Other; Urine  Result Value Ref Range Status   Specimen Description   Final    URINE, RANDOM LEFT RENAL PELVIS URINE Performed at [facility], [address], [place], [place] [address]    Special Requests   Final    NONE Performed at [facility], [place] [address] [place], [place] [address]    Culture 70,000 [place] (A)  Final   Report Status 07/12/2021 FINAL  Final  Urine Culture     Status: Abnormal   Collection Time: 07/05/21 10:48 AM   Specimen: PATH Other; Urine  Result Value Ref Range Status   Specimen Description   Final    URINE, RANDOM RIGHT KIDNEY URINE Performed at [facility], [address] [place], [place] [address]    Special Requests   Final    NONE Performed at [facility], [address] [place], [place] [address]    Culture (A)  Final    >=100,000 COLONIES/mL CANDIDA GLABRATA SEE SEPARATE REPORT Performed at [facility], [place] [address] [place], [place] [address]    Report Status 07/17/2021 FINAL  Final  Antifungal AST 9 Drug Panel     Status: None   Collection Time: 07/05/21 10:48 AM  Result Value Ref Range Status   Organism ID, Yeast Candida glabrata  Corrected    Comment: (NOTE) Identification  performed by account, not confirmed by this laboratory. CORRECTED ON 08/21 AT 1535: PREVIOUSLY REPORTED AS Preliminary report    Amphotericin B MIC 0.5 ug/mL  Final    Comment: (NOTE) Breakpoints have been established for only some  organism-drug combinations as indicated. This test was developed and its performance characteristics determined by [facility]. It has not been cleared or approved by the Food and Drug Administration.    Anidulafungin MIC Comment  Final    Comment: (NOTE) 0.03 ug/mL Susceptible Breakpoints have been established for only some organism-drug combinations as indicated. This test was developed and its performance characteristics determined by [facility]. It has not been cleared or approved by the Food and Drug Administration.    Caspofungin MIC Comment  Final    Comment: (NOTE) 0.06 ug/mL Susceptible Breakpoints have been established for only some organism-drug combinations as indicated. This test was developed and its performance characteristics determined by [facility]. It has not been cleared or approved by the Food and Drug Administration.    Micafungin MIC Comment  Final    Comment: (NOTE) 0.016 ug/mL Susceptible Breakpoints have been established for only some organism-drug combinations as indicated. This test was developed and its performance characteristics determined by [facility]. It has not been cleared or approved by the Food and Drug Administration.    Posaconazole MIC 0.25 ug/mL  Final    Comment: (NOTE) Breakpoints have been established for only some organism-drug combinations as indicated. This test was developed and its performance characteristics determined by [facility]. It has not been cleared or approved by the Food and Drug Administration.    Fluconazole Islt MIC 2.0 ug/mL  Final    Comment: (NOTE) Susceptible Dose Dependent Breakpoints have been established for only some organism-drug combinations as indicated. This test was developed  and its performance characteristics determined by [facility]. It has not been cleared or approved by the Food and Drug Administration.    Flucytosine MIC 0.06 ug/mL or less  Final    Comment: (NOTE) Breakpoints have been established for only some organism-drug combinations as indicated. This test was developed and its performance characteristics determined by [facility]. It has not been cleared or approved by the Food and Drug Administration.    Itraconazole MIC 0.12 ug/mL  Final    Comment: (NOTE) Breakpoints have been established for only some organism-drug combinations as indicated. This test was developed and its performance characteristics determined by [facility]. It has not been cleared or approved by the Food and Drug Administration.    Voriconazole MIC 0.06 ug/mL  Final    Comment: (NOTE) Breakpoints have been established for only some organism-drug combinations as indicated. This test was developed and its performance characteristics determined by [facility]. It has not been cleared or approved by the Food and Drug Administration. Performed At: [facility] [address] [place], [place] [id number] Rush Farmer MD NW:[id number]    Source CANDIDA GLABRATA SUSCEPTIBILITY URINE CULTURE  Final    Comment: Performed at [facility], [place] [address] [place], [place] [address]  CULTURE, BLOOD (ROUTINE X 2) w Reflex to ID Panel     Status: None   Collection Time: 07/15/21  7:57 PM   Specimen: BLOOD  Result Value Ref Range Status   Specimen Description BLOOD LEFT ASSIST CONTROL  Final   Special Requests   Final    BOTTLES DRAWN AEROBIC AND ANAEROBIC Blood Culture adequate volume   Culture   Final    NO GROWTH 5 DAYS Performed at [facility], [address], [place], [place] [address]    Report Status 07/20/2021 FINAL  Final  CULTURE, BLOOD (ROUTINE X 2) w Reflex to ID Panel     Status: None   Collection Time: 07/15/21  8:07 PM  Specimen: BLOOD  Result  Value Ref Range Status   Specimen Description BLOOD LEFT FOREARM  Final   Special Requests   Final    BOTTLES DRAWN AEROBIC AND ANAEROBIC Blood Culture adequate volume   Culture   Final    NO GROWTH 5 DAYS Performed at [facility], [address], [place], [place] [address]    Report Status 07/20/2021 FINAL  Final  Body fluid culture w Gram Stain     Status: None (Preliminary result)   Collection Time: 07/21/21  1:23 PM   Specimen: Peritoneal Washings; Body Fluid  Result Value Ref Range Status   Specimen Description   Final    PERITONEAL Performed at [facility], [address] [place], [place] [address]    Special Requests   Final    NONE Performed at [facility], [address] [place], [place] [address]    Gram Stain   Final    MODERATE WBC PRESENT,BOTH PMN AND MONONUCLEAR NO ORGANISMS SEEN    Culture   Final    RARE CANDIDA GLABRATA Sent to [facility] for further susceptibility testing. Performed at [facility], [place] [address] [place], [place] [address]    Report Status PENDING  Incomplete  Aerobic/Anaerobic Culture w Gram Stain (surgical/deep wound)     Status: None   Collection Time: 07/21/21  3:32 PM   Specimen: Abdomen  Result Value Ref Range Status   Specimen Description   Final    ABDOMEN Performed at [facility], [address] [place], [place] [address]    Special Requests   Final    NONE Performed at [facility], [address] [place], [place] [address]    Gram Stain   Final    MODERATE SQUAMOUS EPITHELIAL CELLS PRESENT WBC PRESENT, PREDOMINANTLY PMN NO ORGANISMS SEEN    Culture   Final    RARE CANDIDA GLABRATA NO ANAEROBES ISOLATED Performed at [facility], [place] [address], [place], [place] [address]    Report Status 07/26/2021 FINAL  Final  CULTURE, BLOOD (ROUTINE X 2) w Reflex to ID Panel     Status: None (Preliminary result)   Collection Time: 07/27/21  4:55 PM   Specimen:  BLOOD  Result Value Ref Range Status   Specimen Description BLOOD BLOOD LEFT HAND  Final   Special Requests   Final    BOTTLES DRAWN AEROBIC AND ANAEROBIC Blood Culture results may not be optimal due to an inadequate volume of blood received in culture bottles   Culture   Final    NO GROWTH < 24 HOURS Performed at [facility], [address] [place], [place] [address]    Report Status PENDING  Incomplete  CULTURE, BLOOD (ROUTINE X 2) w Reflex to ID Panel     Status: None (Preliminary result)   Collection Time: 07/27/21  4:56 PM   Specimen: BLOOD  Result Value Ref Range Status   Specimen Description BLOOD BLOOD RIGHT HAND  Final   Special Requests   Final    BOTTLES DRAWN AEROBIC AND ANAEROBIC Blood Culture adequate volume   Culture   Final    NO GROWTH < 24 HOURS Performed at [facility], [address] [place], [place] [address]    Report Status PENDING  Incomplete    Coagulation Studies: No results for input(s): LABPROT, INR in the last 72 hours.    Urinalysis: No results for input(s): COLORURINE, LABSPEC, [place], [place], [place], [place],  KETONESUR, PROTEINUR, UROBILINOGEN, NITRITE, LEUKOCYTESUR in the last 72 hours.  Invalid input(s): APPERANCEUR      Imaging: DG Chest Port 1 View  Result Date: 07/27/2021 CLINICAL DATA:  Fever. EXAM: PORTABLE CHEST 1 VIEW COMPARISON:  Chest radiograph dated 07/24/2021. FINDINGS: Left-sided dialysis catheter in similar position. Enteric tube extends below the diaphragm with tip beyond the inferior margin of the image. There is cardiomegaly with vascular congestion and mild edema, similar or slightly progressed since the prior radiograph. Pneumonia is not excluded clinical correlation is recommended. No large pleural effusions. No pneumothorax. No acute osseous pathology. IMPRESSION: Cardiomegaly with vascular congestion and mild edema, similar or slightly progressed since the prior radiograph. Pneumonia is not  excluded. Electronically Signed   By: Anner [place] M.D.   On: 07/27/2021 20:58     Medications:    sodium chloride 5 mL/hr at 07/28/21 0400   amphotericin B  Conventional (FUNGIZONE) IV Stopped (07/28/21 0309)    ceFAZolin (ANCEF) IV     feeding supplement (GLUCERNA 1.5 CAL) 1,000 mL (07/24/21 1656)   feeding supplement (GLUCERNA 1.5 CAL)      sodium chloride   Intravenous Once   sodium chloride   Intravenous Once   acetaminophen (TYLENOL) oral liquid 160 mg/5 mL  650 mg Per Tube Q24H   amLODipine  10 mg Per Tube Daily   chlorhexidine  15 mL Mouth Rinse BID   Chlorhexidine Gluconate Cloth  6 each Topical Q0600   cloNIDine  0.1 mg Per Tube Daily   collagenase   Topical Daily   dextrose  10 mL Intravenous Q24H   dextrose  10 mL Intravenous Q24H   epoetin (EPOGEN/PROCRIT) injection  20,000 Units Subcutaneous Weekly   free water  125 mL Per Tube Q4H   furosemide  80 mg Intravenous Q12H   haloperidol lactate  1 mg Intravenous Once   heparin injection (subcutaneous)  5,000 Units Subcutaneous Q8H   insulin aspart  0-20 Units Subcutaneous Q4H   insulin aspart  6 Units Subcutaneous Q4H   insulin detemir  30 Units Subcutaneous BID   mouth rinse  15 mL Mouth Rinse q12n4p   metoprolol tartrate  100 mg Per Tube BID   multivitamin  1 tablet Per Tube QHS   nutrition supplement (JUVEN)  1 packet Per Tube BID BM   pantoprazole (PROTONIX) IV  40 mg Intravenous Q12H   sodium chloride  500 mL Intravenous Q24H   sodium chloride  500 mL Intravenous Q24H   sodium chloride flush  5 mL Intracatheter Q8H     Assessment/ Plan:  [name] is a 75 y.o. white female with diabetes mellitus type II, hypertension, hyperlipidemia, nephrolithiasis, depression, gout who is admitted to [facility] on 06/27/2021 for Hyperkalemia [E87.5] Hyponatremia [E87.1] ARF (acute renal failure) (HCC) [N17.9] Acute renal failure (ARF) (HCC) [N17.9] Acute respiratory failure with hypoxia ([place]) [J96.01] Demand ischemia of  myocardium (HCC) [I24.8] Acute respiratory failure with hypoxemia (HCC) [J96.01] Acute renal failure superimposed on chronic kidney disease, unspecified CKD stage, unspecified acute renal failure type ([place]) [N17.9, N18.9] Sepsis, due to unspecified organism, unspecified whether acute organ dysfunction present [facility]) [A41.9]  Acute Kidney Injury on chronic kidney disease stage IV with baseline creatinine 1.97 and GFR of 25 on 12/22/19.  Acute kidney injury secondary to sepsis and obstructive uropathy. Chronic kidney disease is likely secondary to diabetic nephropathy.Nonoliguric urine output.    -Urine output is fair.  Serum creatinine trend is favorable.  Electrolytes and volume status are acceptable.  No acute indication for dialysis at present.  will monitor closely on a daily basis.  Lab Results  Component Value Date   CREATININE 2.26 (H) 07/28/2021   CREATININE 2.38 (H) 07/27/2021   CREATININE 2.64 (H) 07/26/2021    Intake/Output Summary (Last 24 hours) at 07/28/2021 0827 Last data filed at 07/28/2021 0519 Gross per 24 hour  Intake 2886.93 ml  Output 3300 ml  Net -413.07 ml   Currently getting lasix 80 mg iv BID  2. Acute Respiratory failure secondary to community acquired pneumonia failing outpatient antibiotics. Febrile.  - Continue supplement oxygen.  - IV amphotericin B, IV cefazolin for surgical prophylaxis -8/26-fluid aspirate cultures are showing rare yeast. - 8/20 blood cultures with no growth    3. Anemia of chronic kidney disease Normocytic Lab Results  Component Value Date   HGB 7.8 (L) 07/28/2021   -Scheduled to get EPO 20,000 units weekly/Friday -receiving blood transfusion periodically; latest 8/31  4.  Diabetes mellitus type II with chronic kidney disease insulin dependent. Most recent hemoglobin A1c is 8.3% on 06/12/21.  Metformin held    5.Bilateral Nephrolithiasis- with obstructive uropathy and left renal abscess Abscess drained on August 26 Repeat renal  ultrasound 9/2-report pending  6. Hypokalemia: - Continue potassium supplementation as needed  7.  Hypertension.  Currently being treated with Norvasc, clonidine, IV furosemide, metoprolol Low threshold to reduce the dose of IV furosemide if BUN/creatinine starts to increase.     LOS: [place] 9/2/20228:27 AM

## 2021-07-28 NOTE — Consult Note (Signed)
PHARMACY - PHYSICIAN COMMUNICATION CRITICAL VALUE ALERT - BLOOD CULTURE IDENTIFICATION (BCID)  Heather Lopez is an 75 y.o. female who presented to [facility] on 06/27/2021 with a chief complaint of septic shock, acute respiratory failure. Also AKI noted.  Assessment:   -Acute abdomen secondary to perforated duodenum -urine culture was positive for Candida glabrata being treated with Amphotericin in addition to Zosyn -Blood culture positive for Staph Epi w/mech A&C resistance documented  Name of physician (or Provider) Contacted:  Zierle[place]Ghosh, [place]  Current antibiotics:  -Amphotericin B 40mg  Daily -Zosyn 3.375gms q6hrs -Recommended addition of Vanco only if clinical condition indicated active infection.   Results for orders placed or performed during the hospital encounter of 06/27/21  Blood Culture ID Panel (Reflexed) (Collected: 07/27/2021  4:55 PM)  Result Value Ref Range   Enterococcus faecalis NOT DETECTED NOT DETECTED   Enterococcus Faecium NOT DETECTED NOT DETECTED   Listeria monocytogenes NOT DETECTED NOT DETECTED   Staphylococcus species DETECTED (A) NOT DETECTED   Staphylococcus aureus (BCID) NOT DETECTED NOT DETECTED   Staphylococcus epidermidis DETECTED (A) NOT DETECTED   Staphylococcus lugdunensis NOT DETECTED NOT DETECTED   Streptococcus species NOT DETECTED NOT DETECTED   Streptococcus agalactiae NOT DETECTED NOT DETECTED   Streptococcus pneumoniae NOT DETECTED NOT DETECTED   Streptococcus pyogenes NOT DETECTED NOT DETECTED   A.calcoaceticus-baumannii NOT DETECTED NOT DETECTED   Bacteroides fragilis NOT DETECTED NOT DETECTED   Enterobacterales NOT DETECTED NOT DETECTED   Enterobacter cloacae complex NOT DETECTED NOT DETECTED   Escherichia coli NOT DETECTED NOT DETECTED   Klebsiella aerogenes NOT DETECTED NOT DETECTED   Klebsiella oxytoca NOT DETECTED NOT DETECTED   Klebsiella pneumoniae NOT DETECTED NOT DETECTED   Proteus species NOT DETECTED NOT DETECTED    Salmonella species NOT DETECTED NOT DETECTED   Serratia marcescens NOT DETECTED NOT DETECTED   Haemophilus influenzae NOT DETECTED NOT DETECTED   Neisseria meningitidis NOT DETECTED NOT DETECTED   Pseudomonas aeruginosa NOT DETECTED NOT DETECTED   Stenotrophomonas maltophilia NOT DETECTED NOT DETECTED   Candida albicans NOT DETECTED NOT DETECTED   Candida auris NOT DETECTED NOT DETECTED   Candida glabrata NOT DETECTED NOT DETECTED   Candida krusei NOT DETECTED NOT DETECTED   Candida parapsilosis NOT DETECTED NOT DETECTED   Candida tropicalis NOT DETECTED NOT DETECTED   Cryptococcus neoformans/gattii NOT DETECTED NOT DETECTED   Methicillin resistance mecA/C DETECTED (A) NOT DETECTED    Berta Minor, RPh 07/28/2021  8:05 PM

## 2021-07-28 NOTE — Progress Notes (Addendum)
Date of Admission:  06/27/2021     ID: Heather Lopez is a 75 y.o. female  Active Problems:   Uncontrolled type 2 diabetes mellitus with insulin therapy ([place])   Hyperlipidemia associated with type 2 diabetes mellitus ([place])   Type 2 diabetes mellitus with stage 4 chronic kidney disease, with long-term current use of insulin (HCC)   Acute respiratory failure with hypoxia (HCC)   Severe sepsis with septic shock (HCC)   Acute lower UTI   Acute kidney injury superimposed on chronic kidney disease (HCC)   Pneumoperitoneum   Pressure injury of skin   Atrial flutter with rapid ventricular response (HCC)   Endotracheally intubated   Bilateral nephrolithiasis   Anemia of chronic disease   Demand ischemia of myocardium (HCC)   Lethargy   Duodenal ulcer perforation (HCC)   Renal abscess   Essential hypertension   Hypokalemia    Subjective: Afebrile More responsive Husband at bed side  Medications:   sodium chloride   Intravenous Once   sodium chloride   Intravenous Once   acetaminophen (TYLENOL) oral liquid 160 mg/5 mL  650 mg Per Tube Q24H   amLODipine  10 mg Per Tube Daily   chlorhexidine  15 mL Mouth Rinse BID   Chlorhexidine Gluconate Cloth  6 each Topical Q0600   cloNIDine  0.1 mg Per Tube Daily   collagenase   Topical Daily   dextrose  10 mL Intravenous Q24H   dextrose  10 mL Intravenous Q24H   epoetin (EPOGEN/PROCRIT) injection  20,000 Units Subcutaneous Weekly   free water  125 mL Per Tube Q4H   furosemide  80 mg Intravenous Q12H   haloperidol lactate  1 mg Intravenous Once   heparin injection (subcutaneous)  5,000 Units Subcutaneous Q8H   insulin aspart  0-20 Units Subcutaneous Q4H   insulin aspart  6 Units Subcutaneous Q4H   insulin detemir  30 Units Subcutaneous BID   mouth rinse  15 mL Mouth Rinse q12n4p   metoprolol tartrate  100 mg Per Tube BID   multivitamin  1 tablet Per Tube QHS   nutrition supplement (JUVEN)  1 packet Per Tube BID BM   pantoprazole  (PROTONIX) IV  40 mg Intravenous Q12H   sodium chloride  500 mL Intravenous Q24H   sodium chloride  500 mL Intravenous Q24H   sodium chloride flush  5 mL Intracatheter Q8H    Objective: Vital signs in last 24 hours: Temp:  [98.6 F (37 C)-99 F (37.2 C)] 99 F (37.2 C) (09/02 1600) Pulse Rate:  [82-109] 98 (09/02 1800) Resp:  [18-29] 22 (09/02 1800) BP: (108-167)/(49-99) 135/49 (09/02 1800) SpO2:  [90 %-99 %] 90 % (09/02 1800) FiO2 (%):  [36 %-70 %] 36 % (09/02 1600)  PHYSICAL EXAM:  General: opens eyes on calling and responds verbally, NG tube Lungs: b/l air entry Heart: irregular Abdomen: obese, left upper quadrant drain . foley Extremities: edema ankles Skin: No rashes or lesions. Or bruising Lymph: Cervical, supraclavicular normal. Neurologic: cannot assess  Lab Results Recent Labs    07/27/21 0649 07/27/21 0950 07/28/21 0602  WBC  --  13.3* 11.2*  HGB  --  8.2* 7.8*  HCT  --  25.2* 25.0*  NA 142  --  144  K 3.3*  --  3.5  CL 105  --  107  CO2 23  --  28  BUN 85*  --  88*  CREATININE 2.38*  --  2.26*   Micro 06/27/21 :  Blood culture NG 06/27/21 UC yeast 07/05/21 urine tken from both kidneys -candida Glabrata 8/20 BC NG 07/21/21- renal abscess fluid- Candida glabrata 07/27/21 BC sent Assessment/Plan:   Patient presented with duodenal perforation and underwent laparotomy.  Complicated UTI.  Bilateral nephrolithiasis, hydronephrosis and stents placed on a 07/05/2021. Candida glabrata from the cultures taken from both kidneys.  Patient was initially on low-dose fluconazole which was increased to 800 mg. Patient was spiking fever and found to have worsening Left perirenal/renal abscess on the CT done on 07/20/2021.  Patient underwent drain placement to the left perirenal abscess on 07/21/2021.  Culture sent from that abscess is growing Candida glabrata.  Patient was started on Amphotericin on 07/21/2021.will continue  Has had intermittent fevers since admission.  Was  afebrile for a few days.  And fever again yesterday repeat ultrasound shows persistent left perirenal abscess inspite of drain Recommend urology to see patient and give their opinion No need to repeat the urine culture. Rule out aspiration pneumonia.  Staph epi in 1 of 4 bottle- skin contaminant  AKI on CKD patient started on dialysis but has been now intermittent.  Encephalopathy fluctuates  A. fib with RVR controlled on metoprolol  Acute hypoxic respiratory failure.  Was intubated earlier in the stay.  Remains extubated for more than 2 weeks now.  Morbid obesity with hypoventilation syndrome  Diabetes mellitus on insulin  Severe anemia GI bleed is questioned.  No intervention currently.  Has received PRBC.  Discussed the management with her husband and hospitalist

## 2021-07-28 NOTE — Progress Notes (Signed)
Pharmacy Antibiotic Note  Heather Lopez is a 75 y.o. female admitted on 06/27/2021 with  severe sepsis . During admission patient was found to have complicated UTI. Now with positive blood culture of 1 of 4 bottles with Staph epi. Pharmacy has been consulted for Vancomcyin dosing.   Plan: MD ordered repeat blood cultures to rule out infection vs contaminant Vancomycin 1750mg  IV q24h Will folllow up on repeat cultures  Height: 5' 2.01" (157.5 cm) Weight: (!) 151 kg (332 lb 12.8 oz) IBW/kg (Calculated) : 50.12  Temp (24hrs), Avg:98.9 F (37.2 C), Min:98.6 F (37 C), Max:99 F (37.2 C)  Recent Labs  Lab 07/24/21 0629 07/25/21 0616 07/26/21 0618 07/27/21 0649 07/27/21 0950 07/28/21 0602  WBC 15.5* 14.9* 14.4*  --  13.3* 11.2*  CREATININE 2.78* 2.88* 2.64* 2.38*  --  2.26*    Estimated Creatinine Clearance: 31.2 mL/min (A) (by C-G formula based on SCr of 2.26 mg/dL (H)).    Allergies  Allergen Reactions   Contrast Media  [Iodinated Diagnostic Agents] Anaphylaxis   Iodine Swelling    (IV only) - angioedema    Antimicrobials this admission: Amphotericin 8/26 >>  Vancomycin 9/2 >>   Dose adjustments this admission:  Microbiology results: 9/1 BCx: 1/4 GPC, BCID - Staph epi, Mec A/C + 9/2 BCx: pending   Thank you for allowing pharmacy to be a part of this patient's care.  Paulina Fusi, PharmD, BCPS 07/28/2021 9:58 PM

## 2021-07-28 NOTE — Consult Note (Signed)
PHARMACY CONSULT NOTE - FOLLOW UP  Pharmacy Consult for Electrolyte Monitoring and Replacement   Recent Labs: Potassium (mmol/L)  Date Value  07/28/2021 3.5  01/29/2014 4.0   Magnesium (mg/dL)  Date Value  07/28/2021 2.0  01/29/2014 1.8   Calcium (mg/dL)  Date Value  07/28/2021 9.1   Calcium, Total (mg/dL)  Date Value  01/29/2014 9.7   Albumin (g/dL)  Date Value  07/28/2021 2.0 (L)  07/19/2015 4.5  01/27/2014 4.1   Phosphorus (mg/dL)  Date Value  07/28/2021 4.5   Sodium (mmol/L)  Date Value  07/28/2021 144  07/19/2015 142  01/29/2014 133 (L)     Assessment: 75yo female w/ h/o HTN, DM, HLD, & MDD presenting with weakness/AKI/sepsis with growth of Candida glabrata pyelonephritis from 8/17 Ucx. Nephrology consulted. Pharmacy consulted to manage PRN mgmt of electrolytes  Goal of Therapy:  Lytes WNL  Plan:   Scr 2.88>2.64>2.38>2.26 Na+ WNL; on free water 142mL q4h  K 3.3>3.5 Replaced with IV KCL 10 mEq q1h x4 (9/01). No further repletion today. Pt continues on Lasix IV 80mg  q12h Ordered AM labs for tomorrow, will CTM and adjust PRN  Lorna Dibble ,PharmD, [place] Pharmacist 07/28/2021 12:36 PM

## 2021-07-28 NOTE — Plan of Care (Signed)
  Problem: Health Behavior/Discharge Planning: Goal: Ability to manage health-related needs will improve Outcome: Not Progressing   Problem: Clinical Measurements: Goal: Ability to maintain clinical measurements within normal limits will improve Outcome: Not Progressing Goal: Will remain free from infection Outcome: Not Progressing Goal: Diagnostic test results will improve Outcome: Not Progressing Goal: Respiratory complications will improve Outcome: Not Progressing Goal: Cardiovascular complication will be avoided Outcome: Not Progressing   Problem: Activity: Goal: Risk for activity intolerance will decrease Outcome: Not Progressing   Problem: Nutrition: Goal: Adequate nutrition will be maintained Outcome: Not Progressing   Problem: Coping: Goal: Level of anxiety will decrease Outcome: Not Progressing   Problem: Elimination: Goal: Will not experience complications related to bowel motility Outcome: Not Progressing Goal: Will not experience complications related to urinary retention Outcome: Not Progressing   Problem: Pain Managment: Goal: General experience of comfort will improve Outcome: Not Progressing   Problem: Safety: Goal: Ability to remain free from injury will improve Outcome: Not Progressing   Problem: Skin Integrity: Goal: Risk for impaired skin integrity will decrease Outcome: Not Progressing   Problem: Clinical Measurements: Goal: Ability to maintain clinical measurements within normal limits will improve Outcome: Not Progressing Goal: Postoperative complications will be avoided or minimized Outcome: Not Progressing   Problem: Skin Integrity: Goal: Demonstration of wound healing without infection will improve Outcome: Not Progressing

## 2021-07-28 NOTE — Progress Notes (Signed)
Patient mentation acutely different from previous night. Appears encephalopathic She is hollering and keeps repeating ok. Persistent fever despite Tylenol given at 2123. Temp still 100..1 axillary and the order is for q 6. Amphotericin B infusing. Sharion Settler, NP notified. Orders received. Will continue to monitor.

## 2021-07-29 ENCOUNTER — Inpatient Hospital Stay: Payer: Medicare Other

## 2021-07-29 DIAGNOSIS — N151 Renal and perinephric abscess: Secondary | ICD-10-CM | POA: Diagnosis not present

## 2021-07-29 DIAGNOSIS — R338 Other retention of urine: Secondary | ICD-10-CM

## 2021-07-29 DIAGNOSIS — N136 Pyonephrosis: Secondary | ICD-10-CM

## 2021-07-29 LAB — GLUCOSE, CAPILLARY
Glucose-Capillary: 136 mg/dL — ABNORMAL HIGH (ref 70–99)
Glucose-Capillary: 137 mg/dL — ABNORMAL HIGH (ref 70–99)
Glucose-Capillary: 149 mg/dL — ABNORMAL HIGH (ref 70–99)
Glucose-Capillary: 181 mg/dL — ABNORMAL HIGH (ref 70–99)
Glucose-Capillary: 191 mg/dL — ABNORMAL HIGH (ref 70–99)
Glucose-Capillary: 194 mg/dL — ABNORMAL HIGH (ref 70–99)

## 2021-07-29 LAB — PHOSPHORUS: Phosphorus: 3.9 mg/dL (ref 2.5–4.6)

## 2021-07-29 LAB — BASIC METABOLIC PANEL
Anion gap: 13 (ref 5–15)
BUN: 87 mg/dL — ABNORMAL HIGH (ref 8–23)
CO2: 25 mmol/L (ref 22–32)
Calcium: 9.3 mg/dL (ref 8.9–10.3)
Chloride: 108 mmol/L (ref 98–111)
Creatinine, Ser: 2.26 mg/dL — ABNORMAL HIGH (ref 0.44–1.00)
GFR, Estimated: 22 mL/min — ABNORMAL LOW (ref >60)
Glucose, Bld: 216 mg/dL — ABNORMAL HIGH (ref 70–99)
Potassium: 3.3 mmol/L — ABNORMAL LOW (ref 3.5–5.1)
Sodium: 146 mmol/L — ABNORMAL HIGH (ref 135–145)

## 2021-07-29 LAB — MAGNESIUM: Magnesium: 2.1 mg/dL (ref 1.7–2.4)

## 2021-07-29 MED ORDER — VANCOMYCIN HCL 1250 MG/250ML IV SOLN: 1250.0000 mg | INTRAVENOUS | Status: DC

## 2021-07-29 MED ORDER — POTASSIUM CHLORIDE 20 MEQ PO PACK
40.0000 meq | PACK | Freq: Every day | ORAL | Status: DC
  Administered 2021-07-29 – 2021-08-03 (×6): 40 meq
  Filled 2021-07-29 (×6): qty 2

## 2021-07-29 MED ORDER — POTASSIUM CHLORIDE CRYS ER 20 MEQ PO TBCR: 40.0000 meq | EXTENDED_RELEASE_TABLET | Freq: Every day | ORAL | Status: DC

## 2021-07-29 MED ORDER — POTASSIUM CHLORIDE 20 MEQ PO PACK
40.0000 meq | PACK | Freq: Once | ORAL | Status: AC
  Administered 2021-07-29: 40 meq
  Filled 2021-07-29: qty 2

## 2021-07-29 NOTE — Progress Notes (Signed)
[facility]  ROUNDING NOTE   Subjective:   Heather Lopez is a 75 y.o. white female who was admitted for community acquired pneumonia on 06/27/2021     Hospital course complicated by duodenal perforation s/p repair on 06/30/2021 Left obstructive hydronephrosis and renal abscess, Patient underwent cystoscopy and b/l ureteral stent placement 8/10 8/26- CT guided aspiration of perirenal abscess- 5 ml purulent fluid aspirated Cultures neg so far  UOP has improved with foley placement Lab Results  Component Value Date   CREATININE 2.26 (H) 07/29/2021   CREATININE 2.26 (H) 07/28/2021   CREATININE 2.38 (H) 07/27/2021   09/02 0701 - 09/03 0700 In: 586 [I.V.:586] Out: 3900 [Urine:3900]   Creatinine is settled at 2.2 with an EGFR 22. Currently on high flow nasal cannula. Good urine output at 3.9 L over the preceding 24 hours.   Objective:  Vital signs in last 24 hours:  Temp:  [98.9 F (37.2 C)-99.2 F (37.3 C)] 99.2 F (37.3 C) (09/03 0400) Pulse Rate:  [82-115] 99 (09/03 0915) Resp:  [18-36] 26 (09/03 0900) BP: (108-181)/(49-160) 135/53 (09/03 0915) SpO2:  [87 %-98 %] 87 % (09/03 0900) FiO2 (%):  [36 %-50 %] 50 % (09/03 0821) Weight:  [148 kg] 148 kg (09/03 0500)  Weight change:  Filed Weights   07/22/21 0500 07/27/21 0325 07/29/21 0500  Weight: 125.9 kg (!) 151 kg (!) 148 kg    Intake/Output: I/O last 3 completed shifts: In: 2314.4 [I.V.:614.2; NG/GT:375; IV Piggyback:1325.3] Out: 5300 [Urine:5100; Stool:200]   Intake/Output this shift:  Total I/O In: 1078.2 [I.V.:547.7; NG/GT:180; IV Piggyback:350.5] Out: 1015 [Urine:1000; Drains:15]  Physical Exam: General: No acute distress  Head: HFNC O2, NGT in place  Eyes: Anicteric  Lungs:  +coarse breath sounds  Heart: S1S2  Abdomen:  midline incision  Extremities:  + peripheral edema   Neurologic: Awake, alert, follows commands  GU Foley with yellow urine  Access:  LIJ permcath 8/18  Rectal tube  Basic  Metabolic Panel: Recent Labs  Lab 07/24/21 0629 07/24/21 1642 07/25/21 0616 07/26/21 0618 07/27/21 0649 07/28/21 0602 07/29/21 0615  NA 135  --  138 143 142 144 146*  K 3.1*   < > 3.4* 3.3* 3.3* 3.5 3.3*  CL 101  --  101 106 105 107 108  CO2 23  --  '22 24 23 28 25  ' GLUCOSE 96  --  156* 114* 148* 120* 216*  BUN 65*  --  70* 78* 85* 88* 87*  CREATININE 2.78*  --  2.88* 2.64* 2.38* 2.26* 2.26*  CALCIUM 8.6*  --  8.9 9.0 9.1 9.1 9.3  MG 1.9  --  2.0 2.1  --  2.0 2.1  PHOS  --   --   --  5.0*  --  4.5 3.9   < > = values in this interval not displayed.     Liver Function Tests: Recent Labs  Lab 07/28/21 0602  AST 19  ALT 31  ALKPHOS 98  BILITOT 0.7  PROT 6.7  ALBUMIN 2.0*    No results for input(s): LIPASE, AMYLASE in the last 168 hours. Recent Labs  Lab 07/28/21 0602  AMMONIA 11     CBC: Recent Labs  Lab 07/23/21 0634 07/24/21 0629 07/25/21 0616 07/26/21 0618 07/27/21 0950 07/28/21 0602  WBC 15.2* 15.5* 14.9* 14.4* 13.3* 11.2*  NEUTROABS 11.3* 12.4* 12.2* 11.9*  --   --   HGB 5.8* 7.2* 8.1* 6.9* 8.2* 7.8*  HCT 18.6* 21.1* 24.6* 21.4* 25.2*  25.0*  MCV 87.3 86.8 87.5 89.9 90.0 89.3  PLT 274 225 252 247 321 355     Cardiac Enzymes: No results for input(s): CKTOTAL, CKMB, CKMBINDEX, TROPONINI in the last 168 hours.  BNP: Invalid input(s): POCBNP  CBG: Recent Labs  Lab 07/28/21 1555 07/28/21 1926 07/28/21 2315 07/29/21 0340 07/29/21 0746  GLUCAP 170* 199* 223* 194* 181*     Microbiology: Results for orders placed or performed during the hospital encounter of 06/27/21  Resp Panel by RT-PCR (Flu A&B, Covid) Nasopharyngeal Swab     Status: None   Collection Time: 06/27/21 12:32 AM   Specimen: Nasopharyngeal Swab; Nasopharyngeal(NP) swabs in vial transport medium  Result Value Ref Range Status   SARS Coronavirus 2 by RT PCR NEGATIVE NEGATIVE Final    Comment: (NOTE) SARS-CoV-2 target nucleic acids are NOT DETECTED.  The SARS-CoV-2 RNA is  generally detectable in upper respiratory specimens during the acute phase of infection. The lowest concentration of SARS-CoV-2 viral copies this assay can detect is 138 copies/mL. A negative result does not preclude SARS-Cov-2 infection and should not be used as the sole basis for treatment or other patient management decisions. A negative result may occur with  improper specimen collection/handling, submission of specimen other than nasopharyngeal swab, presence of viral mutation(s) within the areas targeted by this assay, and inadequate number of viral copies(<138 copies/mL). A negative result must be combined with clinical observations, patient history, and epidemiological information. The expected result is Negative.  Fact Sheet for Patients:  EntrepreneurPulse.com.au  Fact Sheet for Healthcare Providers:  IncredibleEmployment.be  This test is no t yet approved or cleared by the [place] FDA and  has been authorized for detection and/or diagnosis of SARS-CoV-2 by FDA under an Emergency Use Authorization (EUA). This EUA will remain  in effect (meaning this test can be used) for the duration of the COVID-19 declaration under Section 564(b)(1) of the Act, 21 U.S.C.section 360bbb-3(b)(1), unless the authorization is terminated  or revoked sooner.       Influenza A by PCR NEGATIVE NEGATIVE Final   Influenza B by PCR NEGATIVE NEGATIVE Final    Comment: (NOTE) The Xpert Xpress SARS-CoV-2/FLU/RSV plus assay is intended as an aid in the diagnosis of influenza from Nasopharyngeal swab specimens and should not be used as a sole basis for treatment. Nasal washings and aspirates are unacceptable for Xpert Xpress SARS-CoV-2/FLU/RSV testing.  Fact Sheet for Patients: EntrepreneurPulse.com.au  Fact Sheet for Healthcare Providers: IncredibleEmployment.be  This test is not yet approved or cleared by the [place] FDA and has been authorized for detection and/or diagnosis of SARS-CoV-2 by FDA under an Emergency Use Authorization (EUA). This EUA will remain in effect (meaning this test can be used) for the duration of the COVID-19 declaration under Section 564(b)(1) of the Act, 21 U.S.C. section 360bbb-3(b)(1), unless the authorization is terminated or revoked.  Performed at [facility], [address] [place], [place] [address]   Blood culture (routine x 2)     Status: None   Collection Time: 06/27/21 12:43 AM   Specimen: BLOOD  Result Value Ref Range Status   Specimen Description BLOOD RIGHT ASSIST CONTROL  Final   Special Requests   Final    BOTTLES DRAWN AEROBIC AND ANAEROBIC Blood Culture results may not be optimal due to an inadequate volume of blood received in culture bottles   Culture   Final    NO GROWTH 5 DAYS Performed at [facility], [place],  [place], [place] [address]    Report Status 07/02/2021 FINAL  Final  Blood culture (routine x 2)     Status: None   Collection Time: 06/27/21  1:45 AM   Specimen: BLOOD  Result Value Ref Range Status   Specimen Description BLOOD RIGHT ASSIST CONTROL  Final   Special Requests   Final    BOTTLES DRAWN AEROBIC AND ANAEROBIC Blood Culture adequate volume   Culture   Final    NO GROWTH 5 DAYS Performed at [facility], [address], [place], [place] [address]    Report Status 07/02/2021 FINAL  Final  Urine Culture     Status: Abnormal   Collection Time: 06/27/21  2:39 AM   Specimen: In/Out Cath Urine  Result Value Ref Range Status   Specimen Description   Final    IN/OUT CATH URINE Performed at [facility], [address], [place], [place] [address]    Special Requests   Final    NONE Performed at [facility], [place], [place] [address]    Culture 30,000 COLONIES/mL YEAST (A)  Final   Report Status 06/28/2021 FINAL  Final  MRSA Next Gen by PCR,  Nasal     Status: None   Collection Time: 06/30/21  5:01 AM   Specimen: Nasal Mucosa; Nasal Swab  Result Value Ref Range Status   MRSA by PCR Next Gen NOT DETECTED NOT DETECTED Final    Comment: (NOTE) The GeneXpert MRSA Assay (FDA approved for NASAL specimens only), is one component of a comprehensive MRSA colonization surveillance program. It is not intended to diagnose MRSA infection nor to guide or monitor treatment for MRSA infections. Test performance is not FDA approved in patients less than 43 years old. Performed at [facility], [address], [place], [place] [address]   Urine Culture     Status: Abnormal   Collection Time: 07/05/21 10:36 AM   Specimen: PATH Other; Urine  Result Value Ref Range Status   Specimen Description   Final    URINE, RANDOM LEFT RENAL PELVIS URINE Performed at [facility], [address] [name], [place] [address]    Special Requests   Final    NONE Performed at [facility], [address] [place], [place] [address]    Culture 70,000 [place] (A)  Final   Report Status 07/12/2021 FINAL  Final  Urine Culture     Status: Abnormal   Collection Time: 07/05/21 10:48 AM   Specimen: PATH Other; Urine  Result Value Ref Range Status   Specimen Description   Final    URINE, RANDOM RIGHT KIDNEY URINE Performed at [facility], [address] [place], [place] [address]    Special Requests   Final    NONE Performed at [facility], [address], [place], [place] [address]    Culture (A)  Final    >=100,000 COLONIES/mL CANDIDA GLABRATA SEE SEPARATE REPORT Performed at [facility], [place] [address] [place], [place] [address]    Report Status 07/17/2021 FINAL  Final  Antifungal AST 9 Drug Panel     Status: None   Collection Time: 07/05/21 10:48 AM  Result Value Ref Range Status   Organism ID, Yeast Candida glabrata  Corrected    Comment: (NOTE) Identification  performed by account, not confirmed by this laboratory. CORRECTED ON 08/21 AT 1535: PREVIOUSLY REPORTED AS Preliminary report    Amphotericin B MIC 0.5 ug/mL  Final    Comment: (NOTE) Breakpoints have been established for only some organism-drug combinations as indicated. This test was developed and its performance characteristics determined by [facility]. It has not been cleared or approved by the Food and Drug Administration.    Anidulafungin MIC Comment  Final    Comment: (NOTE) 0.03 ug/mL Susceptible Breakpoints have been established for only some organism-drug combinations as indicated. This test was developed and its performance characteristics determined by [facility]. It has not been cleared or approved by the Food and Drug Administration.    Caspofungin MIC Comment  Final    Comment: (NOTE) 0.06 ug/mL Susceptible Breakpoints have been established for only some organism-drug combinations as indicated. This test was developed and its performance characteristics determined by [facility]. It has not been cleared or approved by the Food and Drug Administration.    Micafungin MIC Comment  Final    Comment: (NOTE) 0.016 ug/mL Susceptible Breakpoints have been established for only some organism-drug combinations as indicated. This test was developed and its performance characteristics determined by [facility]. It has not been cleared or approved by the Food and Drug Administration.    Posaconazole MIC 0.25 ug/mL  Final    Comment: (NOTE) Breakpoints have been established for only some organism-drug combinations as indicated. This test was developed and its performance characteristics determined by [facility]. It has not been cleared or approved by the Food and Drug Administration.    Fluconazole Islt MIC 2.0 ug/mL  Final    Comment: (NOTE) Susceptible Dose Dependent Breakpoints have been established for only some organism-drug combinations as indicated. This test was developed  and its performance characteristics determined by [facility]. It has not been cleared or approved by the Food and Drug Administration.    Flucytosine MIC 0.06 ug/mL or less  Final    Comment: (NOTE) Breakpoints have been established for only some organism-drug combinations as indicated. This test was developed and its performance characteristics determined by [facility]. It has not been cleared or approved by the Food and Drug Administration.    Itraconazole MIC 0.12 ug/mL  Final    Comment: (NOTE) Breakpoints have been established for only some organism-drug combinations as indicated. This test was developed and its performance characteristics determined by [facility]. It has not been cleared or approved by the Food and Drug Administration.    Voriconazole MIC 0.06 ug/mL  Final    Comment: (NOTE) Breakpoints have been established for only some organism-drug combinations as indicated. This test was developed and its performance characteristics determined by [facility]. It has not been cleared or approved by the Food and Drug Administration. Performed At: [facility] [address] [place], [place] [id number] Rush Farmer MD [id number]    Source CANDIDA GLABRATA SUSCEPTIBILITY URINE CULTURE  Final    Comment: Performed at [facility], [place] [address] [place], [place] [address]  CULTURE, BLOOD (ROUTINE X 2) w Reflex to ID Panel     Status: None   Collection Time: 07/15/21  7:57 PM   Specimen: BLOOD  Result Value Ref Range Status   Specimen Description BLOOD LEFT ASSIST CONTROL  Final   Special Requests   Final    BOTTLES DRAWN AEROBIC AND ANAEROBIC Blood Culture adequate volume   Culture   Final    NO GROWTH 5 DAYS Performed at [facility], [address] [place], [place] [address]    Report Status 07/20/2021 FINAL  Final  CULTURE, BLOOD (ROUTINE X 2) w Reflex to ID Panel  Status: None   Collection Time: 07/15/21  8:07 PM   Specimen: BLOOD  Result  Value Ref Range Status   Specimen Description BLOOD LEFT FOREARM  Final   Special Requests   Final    BOTTLES DRAWN AEROBIC AND ANAEROBIC Blood Culture adequate volume   Culture   Final    NO GROWTH 5 DAYS Performed at [facility], [address], [place], [place] [address]    Report Status 07/20/2021 FINAL  Final  Body fluid culture w Gram Stain     Status: None (Preliminary result)   Collection Time: 07/21/21  1:23 PM   Specimen: Peritoneal Washings; Body Fluid  Result Value Ref Range Status   Specimen Description   Final    PERITONEAL Performed at [facility], [address] [place], [place] [address]    Special Requests   Final    NONE Performed at [facility], [address] [place], [place] [address]    Gram Stain   Final    MODERATE WBC PRESENT,BOTH PMN AND MONONUCLEAR NO ORGANISMS SEEN    Culture   Final    RARE CANDIDA GLABRATA Sent to [place] for further susceptibility testing. Performed at [facility], [place] [address] [place], [place] [address]    Report Status PENDING  Incomplete  Antifungal AST 9 Drug Panel     Status: None (Preliminary result)   Collection Time: 07/21/21  1:23 PM  Result Value Ref Range Status   Organism ID, Yeast Preliminary report  Final    Comment: (NOTE) Specimen has been received and testing has been initiated. Performed At: [facility] [place], [place] [id number] Rush Farmer MD [id number]    Amphotericin B MIC PENDING  Incomplete   Anidulafungin MIC PENDING  Incomplete   Caspofungin MIC PENDING  Incomplete   Micafungin MIC PENDING  Incomplete   Posaconazole MIC PENDING  Incomplete   Fluconazole Islt MIC PENDING  Incomplete   Flucytosine MIC PENDING  Incomplete   Itraconazole MIC PENDING  Incomplete   Voriconazole MIC PENDING  Incomplete   Source CANDIDA GLABRATA/ PERITONEAL FLUID  Final    Comment: Performed at [facility], [place]. [address]  [place], [place] [address]  Aerobic/Anaerobic Culture w Gram Stain (surgical/deep wound)     Status: None   Collection Time: 07/21/21  3:32 PM   Specimen: Abdomen  Result Value Ref Range Status   Specimen Description   Final    ABDOMEN Performed at [facility], [address] [place], [place] [address]    Special Requests   Final    NONE Performed at [facility], [place] [address] [place],  [address]    Gram Stain   Final    MODERATE SQUAMOUS EPITHELIAL CELLS PRESENT WBC PRESENT, PREDOMINANTLY PMN NO ORGANISMS SEEN    Culture   Final    RARE CANDIDA GLABRATA NO ANAEROBES ISOLATED Performed at [facility], [place] [address], [place],  [address]    Report Status 07/26/2021 FINAL  Final  CULTURE, BLOOD (ROUTINE X 2) w Reflex to ID Panel     Status: None (Preliminary result)   Collection Time: 07/27/21  4:55 PM   Specimen: BLOOD  Result Value Ref Range Status   Specimen Description BLOOD BLOOD LEFT HAND  Final   Special Requests   Final    BOTTLES DRAWN AEROBIC AND ANAEROBIC Blood Culture results may not be optimal due to an inadequate volume  of blood received in culture bottles   Culture  Setup Time   Final    Organism ID to follow GRAM POSITIVE COCCI AEROBIC BOTTLE ONLY CRITICAL RESULT CALLED TO, READ BACK BY AND VERIFIED WITH: [employer] WATSON AT 1940 BY SS Performed at [facility], [address] [place], [place] [address]    Culture [facility] POSITIVE COCCI  Final   Report Status PENDING  Incomplete  Blood Culture ID Panel (Reflexed)     Status: Abnormal   Collection Time: 07/27/21  4:55 PM  Result Value Ref Range Status   Enterococcus faecalis NOT DETECTED NOT DETECTED Final   Enterococcus Faecium NOT DETECTED NOT DETECTED Final   Listeria monocytogenes NOT DETECTED NOT DETECTED Final   Staphylococcus species DETECTED (A) NOT DETECTED Final    Comment: CRITICAL RESULT CALLED TO, READ BACK BY AND VERIFIED WITH: SUZANE WATSON AT  1940 ON 07/28/21 BY SS    Staphylococcus aureus (BCID) NOT DETECTED NOT DETECTED Final   Staphylococcus epidermidis DETECTED (A) NOT DETECTED Final    Comment: Methicillin (oxacillin) resistant coagulase negative staphylococcus. Possible blood culture contaminant (unless isolated from more than one blood culture draw or clinical case suggests pathogenicity). No antibiotic treatment is indicated for blood  culture contaminants. CRITICAL RESULT CALLED TO, READ BACK BY AND VERIFIED WITH: [place] ON 07/28/21 BY SS    Staphylococcus lugdunensis NOT DETECTED NOT DETECTED Final   Streptococcus species NOT DETECTED NOT DETECTED Final   Streptococcus agalactiae NOT DETECTED NOT DETECTED Final   Streptococcus pneumoniae NOT DETECTED NOT DETECTED Final   Streptococcus pyogenes NOT DETECTED NOT DETECTED Final   A.calcoaceticus-baumannii NOT DETECTED NOT DETECTED Final   Bacteroides fragilis NOT DETECTED NOT DETECTED Final   Enterobacterales NOT DETECTED NOT DETECTED Final   Enterobacter cloacae complex NOT DETECTED NOT DETECTED Final   Escherichia coli NOT DETECTED NOT DETECTED Final   Klebsiella aerogenes NOT DETECTED NOT DETECTED Final   Klebsiella oxytoca NOT DETECTED NOT DETECTED Final   Klebsiella pneumoniae NOT DETECTED NOT DETECTED Final   Proteus species NOT DETECTED NOT DETECTED Final   Salmonella species NOT DETECTED NOT DETECTED Final   Serratia marcescens NOT DETECTED NOT DETECTED Final   Haemophilus influenzae NOT DETECTED NOT DETECTED Final   Neisseria meningitidis NOT DETECTED NOT DETECTED Final   Pseudomonas aeruginosa NOT DETECTED NOT DETECTED Final   Stenotrophomonas maltophilia NOT DETECTED NOT DETECTED Final   Candida albicans NOT DETECTED NOT DETECTED Final   Candida auris NOT DETECTED NOT DETECTED Final   Candida glabrata NOT DETECTED NOT DETECTED Final   Candida krusei NOT DETECTED NOT DETECTED Final   Candida parapsilosis NOT DETECTED NOT DETECTED Final    Candida tropicalis NOT DETECTED NOT DETECTED Final   Cryptococcus neoformans/gattii NOT DETECTED NOT DETECTED Final   Methicillin resistance mecA/C DETECTED (A) NOT DETECTED Final    Comment: CRITICAL RESULT CALLED TO, READ BACK BY AND VERIFIED WITH: [place] ON 07/28/21 BY SS Performed at [facility], [address] [place], [place] [address]   CULTURE, BLOOD (ROUTINE X 2) w Reflex to ID Panel     Status: None (Preliminary result)   Collection Time: 07/27/21  4:56 PM   Specimen: BLOOD  Result Value Ref Range Status   Specimen Description BLOOD BLOOD RIGHT HAND  Final   Special Requests   Final    BOTTLES DRAWN AEROBIC AND ANAEROBIC Blood Culture adequate volume   Culture   Final    NO GROWTH 2  DAYS Performed at [facility], [address] [place], [place] [address]    Report Status PENDING  Incomplete  CULTURE, BLOOD (ROUTINE X 2) w Reflex to ID Panel     Status: None (Preliminary result)   Collection Time: 07/28/21  8:59 PM   Specimen: BLOOD  Result Value Ref Range Status   Specimen Description BLOOD BLOOD RIGHT HAND  Final   Special Requests   Final    BOTTLES DRAWN AEROBIC AND ANAEROBIC Blood Culture results may not be optimal due to an inadequate volume of blood received in culture bottles   Culture   Final    NO GROWTH < 12 HOURS Performed at [facility], [address] [place], [place] [address]    Report Status PENDING  Incomplete    Coagulation Studies: No results for input(s): LABPROT, INR in the last 72 hours.    Urinalysis: No results for input(s): COLORURINE, LABSPEC, PHURINE, GLUCOSEU, HGBUR, BILIRUBINUR, KETONESUR, PROTEINUR, UROBILINOGEN, NITRITE, LEUKOCYTESUR in the last 72 hours.  Invalid input(s): APPERANCEUR      Imaging: US RENAL  Result Date: 07/28/2021 CLINICAL DATA:  Status post renal abscess drainage. EXAM: RENAL / URINARY TRACT ULTRASOUND COMPLETE COMPARISON:  07/24/2021 FINDINGS: Right Kidney:  Renal measurements: 14.4 x 5.8 x 6.3 cm = volume: 28 cc. Mild fullness noted right intrarenal collecting system with shadowing 15 mm lower pole stone. Left Kidney: Renal measurements: 14.4 x 7.2 x 5.1 cm = volume: 28 mL. Complex collection again identified adjacent to the left kidney measuring similarly at 10.0 X 3.8 x 7.9 cm compared to 9.7 x 2.4 x 6.7 cm previously. Bladder: Appears normal for degree of bladder distention. Other: None. IMPRESSION: No substantial change complex fluid collection adjacent to the left kidney compatible with reported history of abscess. Electronically Signed   By: Misty Stanley M.D.   On: 07/28/2021 11:21   DG Chest Port 1 View  Result Date: 07/27/2021 CLINICAL DATA:  Fever. EXAM: PORTABLE CHEST 1 VIEW COMPARISON:  Chest radiograph dated 07/24/2021. FINDINGS: Left-sided dialysis catheter in similar position. Enteric tube extends below the diaphragm with tip beyond the inferior margin of the image. There is cardiomegaly with vascular congestion and mild edema, similar or slightly progressed since the prior radiograph. Pneumonia is not excluded clinical correlation is recommended. No large pleural effusions. No pneumothorax. No acute osseous pathology. IMPRESSION: Cardiomegaly with vascular congestion and mild edema, similar or slightly progressed since the prior radiograph. Pneumonia is not excluded. Electronically Signed   By: Anner Crete M.D.   On: 07/27/2021 20:58     Medications:    sodium chloride 5 mL/hr at 07/29/21 0900   amphotericin B  Conventional (FUNGIZONE) IV Stopped (07/29/21 0304)   feeding supplement (GLUCERNA 1.5 CAL) 1,000 mL (07/24/21 1656)   feeding supplement (GLUCERNA 1.5 CAL)     [START ON 07/30/2021] vancomycin      sodium chloride   Intravenous Once   sodium chloride   Intravenous Once   acetaminophen (TYLENOL) oral liquid 160 mg/5 mL  650 mg Per Tube Q24H   amLODipine  10 mg Per Tube Daily   chlorhexidine  15 mL Mouth Rinse BID    Chlorhexidine Gluconate Cloth  6 each Topical Q0600   cloNIDine  0.1 mg Per Tube Daily   collagenase   Topical Daily   dextrose  10 mL Intravenous Q24H   dextrose  10 mL Intravenous Q24H   epoetin (EPOGEN/PROCRIT) injection  20,000 Units Subcutaneous Weekly   free water  125 mL Per Tube Q4H   furosemide  80 mg Intravenous Q12H   haloperidol lactate  1 mg Intravenous Once   heparin injection (subcutaneous)  5,000 Units Subcutaneous Q8H   insulin aspart  0-20 Units Subcutaneous Q4H   insulin aspart  6 Units Subcutaneous Q4H   insulin detemir  30 Units Subcutaneous BID   mouth rinse  15 mL Mouth Rinse q12n4p   metoprolol tartrate  100 mg Per Tube BID   multivitamin  1 tablet Per Tube QHS   nutrition supplement (JUVEN)  1 packet Per Tube BID BM   pantoprazole (PROTONIX) IV  40 mg Intravenous Q12H   sodium chloride  500 mL Intravenous Q24H   sodium chloride  500 mL Intravenous Q24H   sodium chloride flush  5 mL Intracatheter Q8H     Assessment/ Plan:  Ms. KINSLEIGH LUDOLPH is a 75 y.o. white female with diabetes mellitus type II, hypertension, hyperlipidemia, nephrolithiasis, depression, gout who is admitted to [facility] on 06/27/2021 for Hyperkalemia [E87.5] Hyponatremia [E87.1] ARF (acute renal failure) (HCC) [N17.9] Acute renal failure (ARF) (HCC) [N17.9] Acute respiratory failure with hypoxia ([place]) [J96.01] Demand ischemia of myocardium (HCC) [I24.8] Acute respiratory failure with hypoxemia (HCC) [J96.01] Acute renal failure superimposed on chronic kidney disease, unspecified CKD stage, unspecified acute renal failure type ([place]) [N17.9, N18.9] Sepsis, due to unspecified organism, unspecified whether acute organ dysfunction present [facility]) [A41.9]  Acute Kidney Injury on chronic kidney disease stage IV with baseline creatinine 1.97 and GFR of 25 on 12/22/19.  Acute kidney injury secondary to sepsis and obstructive uropathy. Chronic kidney disease is likely secondary to diabetic  nephropathy.Nonoliguric urine output.    -Good urine output of 3.9 L over the preceding 24 hours.  Creatinine currently 2.2.  Therefore no additional need for dialysis at the moment though we will continue to assess the patient for needs.  Remains on Lasix.  May need to cut this back if sodium trends higher.  Lab Results  Component Value Date   CREATININE 2.26 (H) 07/29/2021   CREATININE 2.26 (H) 07/28/2021   CREATININE 2.38 (H) 07/27/2021    Intake/Output Summary (Last 24 hours) at 07/29/2021 1020 Last data filed at 07/29/2021 0900 Gross per 24 hour  Intake 1664.16 ml  Output 4915 ml  Net -3250.84 ml    2. Acute Respiratory failure secondary to community acquired pneumonia failing outpatient antibiotics. Febrile.  -Patient maintained on high flow nasal cannula at the moment.  3. Anemia of chronic kidney disease Normocytic Lab Results  Component Value Date   HGB 7.8 (L) 07/28/2021   -Nursing stated that they administered Epogen today.  4.  Diabetes mellitus type II with chronic kidney disease insulin dependent. Most recent hemoglobin A1c is 8.3% on 06/12/21.  Metformin held    5.Bilateral Nephrolithiasis- with obstructive uropathy and left renal abscess Abscess drained on August 26 Repeat renal ultrasound 9/2-no change in complex fluid collection noted adjacent to the left kidney. 6. Hypokalemia: - Continue potassium supplementation as needed       LOS: 32 Taniyah Ballow 9/3/202210:20 AM

## 2021-07-29 NOTE — Progress Notes (Signed)
Per VO from Dr. Delaine Lame, she does not want oral contrast given to obtain CT of abdomen/pelvis  Called CT and order was changed.   X1 Contrast bottle was given.   Will notify night shift RN

## 2021-07-29 NOTE — Progress Notes (Signed)
16 Days Post-Op  Subjective: I was asked to see Heather Lopez again for a persistent left perinephric fluid collection on a [place] on 9/2 done in f/u for her history of bilateral hydro with subsequent stent placement and IR drainage of a perinephric abscess.   She was found to have Candida Glabrata and is currently on amphotericin.  She is not communicative at this time but had some tenderness in the left flank.  The wound drain output has been minimal over the last 2 days.  She has good UOP and a stable to improving Cr.  ROS:  Review of Systems  Unable to perform ROS: Mental acuity   Anti-infectives: Anti-infectives (From admission, onward)    Start     Dose/Rate Route Frequency Ordered Stop   07/30/21 2200  vancomycin (VANCOREADY) IVPB 1250 mg/250 mL  Status:  Discontinued        1,250 mg 166.7 mL/hr over 90 Minutes Intravenous Every 48 hours 07/29/21 0913 07/29/21 1258   07/28/21 2245  vancomycin (VANCOREADY) IVPB 1750 mg/350 mL  Status:  Discontinued        1,750 mg 175 mL/hr over 120 Minutes Intravenous Every 24 hours 07/28/21 2149 07/29/21 0909   07/28/21 0600  ceFAZolin (ANCEF) IVPB 3g/100 mL premix        3 g 200 mL/hr over 30 Minutes Intravenous To Radiology 07/25/21 1511 07/29/21 0600   07/22/21 0200  piperacillin-tazobactam (ZOSYN) IVPB 3.375 g  Status:  Discontinued        3.375 g 100 mL/hr over 30 Minutes Intravenous Every 6 hours 07/21/21 1726 07/24/21 1140   07/21/21 2200  amphotericin B (FUNGIZONE) 40 mg in dextrose 5 % 500 mL IVPB        0.3 mg/kg  125.9 kg 125 mL/hr over 4 Hours Intravenous Every 24 hours 07/21/21 1644     07/21/21 2000  fluconazole (DIFLUCAN) IVPB 800 mg  Status:  Discontinued        800 mg 100 mL/hr over 240 Minutes Intravenous Every 24 hours 07/20/21 2310 07/21/21 1644   07/21/21 0015  fluconazole (DIFLUCAN) IVPB 400 mg        400 mg 100 mL/hr over 120 Minutes Intravenous  Once 07/20/21 2315 07/21/21 0139   07/21/21 0000  fluconazole (DIFLUCAN) IVPB 500  mg  Status:  Discontinued        500 mg 125 mL/hr over 120 Minutes Intravenous  Once 07/20/21 2310 07/20/21 2315   07/20/21 2200  amphotericin B liposome (AMBISOME) 400 mg in dextrose 5 % 500 mL IVPB  Status:  Discontinued        5 mg/kg  80.4 kg (Adjusted) 300 mL/hr over 120 Minutes Intravenous Every 24 hours 07/20/21 2020 07/20/21 2211   07/19/21 2000  fluconazole (DIFLUCAN) IVPB 800 mg  Status:  Discontinued        800 mg 100 mL/hr over 240 Minutes Intravenous Every 24 hours 07/19/21 0747 07/19/21 0928   07/19/21 2000  fluconazole (DIFLUCAN) IVPB 800 mg  Status:  Discontinued        800 mg 100 mL/hr over 240 Minutes Intravenous Every 24 hours 07/19/21 0928 07/20/21 2020   07/17/21 2200  fluconazole (DIFLUCAN) IVPB 400 mg  Status:  Discontinued       See Hyperspace for full Linked Orders Report.   400 mg 100 mL/hr over 120 Minutes Intravenous Every 24 hours 07/17/21 1226 07/19/21 0747   07/17/21 2000  fluconazole (DIFLUCAN) IVPB 800 mg  Status:  Discontinued  800 mg 200 mL/hr over 120 Minutes Intravenous Every 24 hours 07/17/21 1216 07/17/21 1226   07/17/21 2000  fluconazole (DIFLUCAN) IVPB 400 mg  Status:  Discontinued       See Hyperspace for full Linked Orders Report.   400 mg 100 mL/hr over 120 Minutes Intravenous Every 24 hours 07/17/21 1226 07/19/21 0747   07/15/21 1800  piperacillin-tazobactam (ZOSYN) IVPB 3.375 g  Status:  Discontinued        3.375 g 12.5 mL/hr over 240 Minutes Intravenous Every 8 hours 07/15/21 1616 07/21/21 1726   07/15/21 1645  vancomycin (VANCOREADY) IVPB 2000 mg/400 mL        2,000 mg 200 mL/hr over 120 Minutes Intravenous  Once 07/15/21 1556 07/15/21 1842   07/08/21 1200  anidulafungin (ERAXIS) 100 mg in sodium chloride 0.9 % 100 mL IVPB  Status:  Discontinued       See Hyperspace for full Linked Orders Report.   100 mg 78 mL/hr over 100 Minutes Intravenous Every 24 hours 07/07/21 1015 07/07/21 1855   07/07/21 2000  fluconazole (DIFLUCAN) IVPB  300 mg  Status:  Discontinued        300 mg 75 mL/hr over 120 Minutes Intravenous Every 24 hours 07/07/21 1905 07/07/21 1913   07/07/21 2000  fluconazole (DIFLUCAN) IVPB 400 mg  Status:  Discontinued        400 mg 100 mL/hr over 120 Minutes Intravenous Every 24 hours 07/07/21 1913 07/17/21 1216   07/07/21 1200  anidulafungin (ERAXIS) 200 mg in sodium chloride 0.9 % 200 mL IVPB       See Hyperspace for full Linked Orders Report.   200 mg 78 mL/hr over 200 Minutes Intravenous  Once 07/07/21 1015 07/07/21 1616   07/05/21 2200  piperacillin-tazobactam (ZOSYN) IVPB 2.25 g        2.25 g 100 mL/hr over 30 Minutes Intravenous Every 6 hours 07/05/21 1557 07/08/21 2359   07/01/21 1800  fluconazole (DIFLUCAN) IVPB 200 mg  Status:  Discontinued        200 mg 100 mL/hr over 60 Minutes Intravenous Every 24 hours 06/30/21 1328 07/07/21 1015   06/30/21 1800  fluconazole (DIFLUCAN) IVPB 400 mg        400 mg 100 mL/hr over 120 Minutes Intravenous  Once 06/30/21 1328 06/30/21 2205   06/30/21 0500  ceFAZolin (ANCEF) IVPB 1 g/50 mL premix  Status:  Discontinued       Note to Pharmacy: To be given in specials   1 g 100 mL/hr over 30 Minutes Intravenous  Once 06/29/21 0703 06/29/21 1637   06/30/21 0430  fluconazole (DIFLUCAN) IVPB 400 mg  Status:  Discontinued        400 mg 100 mL/hr over 120 Minutes Intravenous Every 24 hours 06/30/21 0337 06/30/21 1326   06/29/21 2330  piperacillin-tazobactam (ZOSYN) IVPB 2.25 g  Status:  Discontinued        2.25 g 100 mL/hr over 30 Minutes Intravenous Every 8 hours 06/29/21 2240 07/05/21 1557   06/29/21 1236  ceFAZolin (ANCEF) 1-4 GM/50ML-% IVPB       Note to Pharmacy: Corlis Hove   : cabinet override      06/29/21 1236 06/30/21 0044   06/28/21 1600  fluconazole (DIFLUCAN) tablet 150 mg        150 mg Oral  Once 06/28/21 1507 06/28/21 1649   06/27/21 0145  cefTRIAXone (ROCEPHIN) 2 g in sodium chloride 0.9 % 100 mL IVPB  Status:  Discontinued  2 g 200 mL/hr  over 30 Minutes Intravenous Every 24 hours 06/27/21 0137 06/29/21 2302   06/27/21 0145  azithromycin (ZITHROMAX) 500 mg in sodium chloride 0.9 % 250 mL IVPB  Status:  Discontinued        500 mg 250 mL/hr over 60 Minutes Intravenous Every 24 hours 06/27/21 0137 06/29/21 1138       Current Facility-Administered Medications  Medication Dose Route Frequency Provider Last Rate Last Admin   0.9 %  sodium chloride infusion (Manually program via Guardrails IV Fluids)   Intravenous Once Sharen Hones, MD 10 mL/hr at 07/25/21 0500 Rate Verify at 07/25/21 0500   0.9 %  sodium chloride infusion (Manually program via Guardrails IV Fluids)   Intravenous Once Nolberto Hanlon, MD       0.9 %  sodium chloride infusion   Intravenous PRN Sharen Hones, MD 5 mL/hr at 07/29/21 1200 Infusion Verify at 07/29/21 1200   acetaminophen (TYLENOL) 160 MG/5ML solution 650 mg  650 mg Per Tube Q6H PRN Rust-Chester, Britton L, NP   650 mg at 07/27/21 0838   acetaminophen (TYLENOL) 160 MG/5ML solution 650 mg  650 mg Per Tube Q24H Berton Mount, RPH   650 mg at 07/28/21 2055   amLODipine (NORVASC) tablet 10 mg  10 mg Per Tube Daily Algernon Huxley, MD   10 mg at 07/29/21 0915   amphotericin B (FUNGIZONE) 40 mg in dextrose 5 % 500 mL IVPB  0.3 mg/kg Intravenous Q24H Tsosie Billing, MD   Stopped at 07/29/21 0304   chlorhexidine (PERIDEX) 0.12 % solution 15 mL  15 mL Mouth Rinse BID Bennie Pierini, MD   15 mL at 07/29/21 0920   Chlorhexidine Gluconate Cloth 2 % PADS 6 each  6 each Topical Q0600 Algernon Huxley, MD   6 each at 07/29/21 0516   cloNIDine (CATAPRES) tablet 0.1 mg  0.1 mg Per Tube Daily Loletha Grayer, MD   0.1 mg at 07/29/21 0915   collagenase (SANTYL) ointment   Topical Daily Sharen Hones, MD   Given at 07/29/21 0930   dextrose 5 % 10 mL  10 mL Intravenous Q24H Ravishankar, Joellyn Quails, MD   10 mL at 07/28/21 2151   dextrose 5 % 10 mL  10 mL Intravenous Q24H Tsosie Billing, MD   10 mL at 07/29/21 0306    diphenhydrAMINE (BENADRYL) injection 25 mg  25 mg Intravenous Daily PRN Tsosie Billing, MD       Or   diphenhydrAMINE (BENADRYL) 12.5 MG/5ML elixir 25 mg  25 mg Per Tube Daily PRN Tsosie Billing, MD       epoetin alfa (EPOGEN) injection 20,000 Units  20,000 Units Subcutaneous Weekly Murlean Iba, MD   20,000 Units at 07/29/21 0927   feeding supplement (GLUCERNA 1.5 CAL) liquid 1,000 mL  1,000 mL Per Tube Continuous Sharen Hones, MD 60 mL/hr at 07/24/21 1656 1,000 mL at 07/24/21 1656   feeding supplement (GLUCERNA 1.5 CAL) liquid 1,000 mL  1,000 mL Per Tube Continuous Sharen Hones, MD   1,000 mL at 07/29/21 1257   free water 125 mL  125 mL Per Tube Q4H Sharen Hones, MD   125 mL at 07/29/21 1218   furosemide (LASIX) injection 80 mg  80 mg Intravenous Q12H Sharen Hones, MD   80 mg at 07/29/21 0515   haloperidol lactate (HALDOL) injection 1 mg  1 mg Intravenous Once Sharion Settler, NP       heparin injection 1,000 Units  1,000 Units Dialysis  PRN Algernon Huxley, MD   2,800 Units at 07/19/21 1210   heparin injection 5,000 Units  5,000 Units Subcutaneous Q8H Tsosie Billing D, PA-C   5,000 Units at 07/29/21 6659   hydrALAZINE (APRESOLINE) injection 10 mg  10 mg Intravenous Q2H PRN Algernon Huxley, MD   10 mg at 07/14/21 2233   insulin aspart (novoLOG) injection 0-20 Units  0-20 Units Subcutaneous Q4H Algernon Huxley, MD   4 Units at 07/29/21 1218   insulin aspart (novoLOG) injection 6 Units  6 Units Subcutaneous Q4H Rust-Chester, Britton L, NP   6 Units at 07/29/21 1218   insulin detemir (LEVEMIR) injection 30 Units  30 Units Subcutaneous BID Sharen Hones, MD   30 Units at 07/29/21 0858   lidocaine (PF) (XYLOCAINE) 1 % injection 5 mL  5 mL Intradermal PRN Algernon Huxley, MD       lidocaine-prilocaine (EMLA) cream 1 application  1 application Topical PRN Lucky Cowboy, Erskine Squibb, MD       lip balm (BLISTEX) ointment   Topical PRN Nolberto Hanlon, MD       MEDLINE mouth rinse  15 mL Mouth Rinse q12n4p Bennie Pierini, MD   15 mL at 07/29/21 1219   meperidine (DEMEROL) injection 25 mg  25 mg Intravenous Q15 min PRN Tsosie Billing, MD       metoprolol tartrate (LOPRESSOR) tablet 100 mg  100 mg Per Tube BID Sharen Hones, MD   100 mg at 07/29/21 0915   multivitamin (RENA-VIT) tablet 1 tablet  1 tablet Per Tube QHS Algernon Huxley, MD   1 tablet at 07/28/21 2150   nutrition supplement (JUVEN) (JUVEN) powder packet 1 packet  1 packet Per Tube BID BM Algernon Huxley, MD   1 packet at 07/29/21 0932   ondansetron (ZOFRAN) injection 4 mg  4 mg Intravenous Q6H PRN Algernon Huxley, MD   4 mg at 07/23/21 0745   oxyCODONE (ROXICODONE) 5 MG/5ML solution 5 mg  5 mg Per Tube Q6H PRN Loletha Grayer, MD   5 mg at 07/27/21 1438   pantoprazole (PROTONIX) injection 40 mg  40 mg Intravenous Q12H Sharen Hones, MD   40 mg at 07/29/21 0920   pentafluoroprop-tetrafluoroeth (GEBAUERS) aerosol 1 application  1 application Topical PRN Algernon Huxley, MD       sodium chloride 0.9 % bolus 500 mL  500 mL Intravenous Q24H Ravishankar, Joellyn Quails, MD   500 mL at 07/28/21 2055   sodium chloride 0.9 % bolus 500 mL  500 mL Intravenous Q24H Tsosie Billing, MD   500 mL at 07/29/21 0307   sodium chloride flush (NS) 0.9 % injection 5 mL  5 mL Intracatheter Q8H El-Abd, Joesph Fillers, MD   5 mL at 07/29/21 0526     Objective: Vital signs in last 24 hours: Temp:  [98.9 F (37.2 C)-99.2 F (37.3 C)] 98.9 F (37.2 C) (09/03 1200) Pulse Rate:  [87-115] 92 (09/03 1200) Resp:  [22-36] 30 (09/03 1200) BP: (117-181)/(37-160) 121/47 (09/03 1200) SpO2:  [87 %-98 %] 92 % (09/03 1200) FiO2 (%):  [36 %-60 %] 60 % (09/03 1200) Weight:  [148 kg] 148 kg (09/03 0500)  Intake/Output from previous day: 09/02 0701 - 09/03 0700 In: 1474.2 [I.V.:1123.7; IV Piggyback:350.5] Out: 9357 [Urine:3900] Intake/Output this shift: Total I/O In: 470 [I.V.:25; NG/GT:445] Out: 0177 [Urine:1250; Drains:15]   Physical Exam Constitutional:      Appearance:  She is well-developed. She is obese. She is ill-appearing.  Abdominal:     Palpations: Abdomen is soft.     Tenderness: There is abdominal tenderness (left flank.  Drain is in place with an empty bulb.).    Lab Results:  Recent Labs    07/27/21 0950 07/28/21 0602  WBC 13.3* 11.2*  HGB 8.2* 7.8*  HCT 25.2* 25.0*  PLT 321 355   BMET Recent Labs    07/28/21 0602 07/29/21 0615  NA 144 146*  K 3.5 3.3*  CL 107 108  CO2 28 25  GLUCOSE 120* 216*  BUN 88* 87*  CREATININE 2.26* 2.26*  CALCIUM 9.1 9.3   PT/INR No results for input(s): LABPROT, INR in the last 72 hours. ABG No results for input(s): PHART, HCO3 in the last 72 hours.  Invalid input(s): PCO2, PO2  Studies/Results: US RENAL  Result Date: 07/28/2021 CLINICAL DATA:  Status post renal abscess drainage. EXAM: RENAL / URINARY TRACT ULTRASOUND COMPLETE COMPARISON:  07/24/2021 FINDINGS: Right Kidney: Renal measurements: 14.4 x 5.8 x 6.3 cm = volume: 28 cc. Mild fullness noted right intrarenal collecting system with shadowing 15 mm lower pole stone. Left Kidney: Renal measurements: 14.4 x 7.2 x 5.1 cm = volume: 28 mL. Complex collection again identified adjacent to the left kidney measuring similarly at 10.0 X 3.8 x 7.9 cm compared to 9.7 x 2.4 x 6.7 cm previously. Bladder: Appears normal for degree of bladder distention. Other: None. IMPRESSION: No substantial change complex fluid collection adjacent to the left kidney compatible with reported history of abscess. Electronically Signed   By: Misty Stanley M.D.   On: 07/28/2021 11:21   DG Chest Port 1 View  Result Date: 07/27/2021 CLINICAL DATA:  Fever. EXAM: PORTABLE CHEST 1 VIEW COMPARISON:  Chest radiograph dated 07/24/2021. FINDINGS: Left-sided dialysis catheter in similar position. Enteric tube extends below the diaphragm with tip beyond the inferior margin of the image. There is cardiomegaly with vascular congestion and mild edema, similar or slightly progressed since the  prior radiograph. Pneumonia is not excluded clinical correlation is recommended. No large pleural effusions. No pneumothorax. No acute osseous pathology. IMPRESSION: Cardiomegaly with vascular congestion and mild edema, similar or slightly progressed since the prior radiograph. Pneumonia is not excluded. Electronically Signed   By: Anner Crete M.D.   On: 07/27/2021 20:58     Assessment and Plan: Left perinephric abscess with residual fluid on f/u US and minimal drainage from the previously placed drain.   I would recommend having IR assess the need to reposition or replace the drain.  Bilateral hydro and retention.   S/p stenting with no residual hydro and good UOP per foley.       LOS: 32 days    Irine Seal 07/29/2021 [phone] Patient ID: Heather Lopez, female   DOB: Jun 06, 1946, 75 y.o.   MRN: [id number]

## 2021-07-29 NOTE — Consult Note (Signed)
PHARMACY CONSULT NOTE - FOLLOW UP  Pharmacy Consult for Electrolyte Monitoring and Replacement   Recent Labs: Potassium (mmol/L)  Date Value  07/29/2021 3.3 (L)  01/29/2014 4.0   Magnesium (mg/dL)  Date Value  07/29/2021 2.1  01/29/2014 1.8   Calcium (mg/dL)  Date Value  07/29/2021 9.3   Calcium, Total (mg/dL)  Date Value  01/29/2014 9.7   Albumin (g/dL)  Date Value  07/28/2021 2.0 (L)  07/19/2015 4.5  01/27/2014 4.1   Phosphorus (mg/dL)  Date Value  07/29/2021 3.9   Sodium (mmol/L)  Date Value  07/29/2021 146 (H)  07/19/2015 142  01/29/2014 133 (L)     Assessment: 75yo female w/ h/o HTN, DM, HLD, & MDD presenting with weakness/AKI/sepsis with growth of Candida glabrata pyelonephritis from 8/17 Ucx. Nephrology consulted. Pharmacy consulted to manage PRN mgmt of electrolytes  Goal of Therapy:  Lytes WNL  Plan:   Will give Kcl 40 mEq x 1 per tube.  Will f/u with BMP in the AM.   Oswald Hillock ,PharmD Clinical Pharmacist 07/29/2021 9:17 AM

## 2021-07-29 NOTE — Progress Notes (Signed)
ID Lethargic but more awake Trying to respond verbally No fever> 48 hrs   Patient Vitals for the past 24 hrs:  BP Temp Temp src Pulse Resp SpO2 Weight  07/29/21 1400 (!) 135/50 -- -- 95 (!) 24 92 % --  07/29/21 1300 (!) 135/49 -- -- 92 (!) 26 95 % --  07/29/21 1200 (!) 121/47 98.9 F (37.2 C) Oral 92 (!) 30 92 % --  07/29/21 1100 (!) 134/47 -- -- 87 (!) 27 90 % --  07/29/21 1010 -- -- -- -- -- 92 % --  07/29/21 1000 (!) 123/37 -- -- 88 (!) 27 (!) 88 % --  07/29/21 0915 (!) 135/53 -- -- 99 -- -- --  07/29/21 0900 (!) 135/53 -- -- (!) 101 (!) 26 (!) 87 % --  07/29/21 0800 127/64 99 F (37.2 C) Oral (!) 102 -- 94 % --  07/29/21 0700 (!) 147/54 -- -- (!) 102 -- 94 % --  07/29/21 0600 (!) 152/53 -- -- 100 -- 93 % --  07/29/21 0500 (!) 153/52 -- -- 100 -- 94 % (!) 148 kg  07/29/21 0400 (!) 145/53 99.2 F (37.3 C) Axillary 97 -- 94 % --  07/29/21 0300 (!) 168/63 -- -- (!) 102 -- 97 % --  07/29/21 0200 (!) 181/160 -- -- 96 -- 96 % --  07/29/21 0100 -- -- -- (!) 101 -- 98 % --  07/29/21 0000 (!) 158/98 98.9 F (37.2 C) Axillary 99 -- 95 % --  07/28/21 2300 (!) 141/71 -- -- 95 -- 93 % --  07/28/21 2200 (!) 152/62 -- -- (!) 107 -- 96 % --  07/28/21 2100 117/69 -- -- (!) 115 (!) 36 (!) 89 % --  07/28/21 2000 (!) 133/52 99.2 F (37.3 C) Axillary (!) 108 (!) 30 92 % --  07/28/21 1900 138/65 -- -- (!) 104 (!) 31 90 % --  07/28/21 1800 (!) 135/49 -- -- 98 (!) 22 90 % --  07/28/21 1700 (!) 140/99 -- -- 93 (!) 27 90 % --  07/28/21 1600 (!) 137/58 99 F (37.2 C) Axillary 99 (!) 29 94 % --  07/28/21 1500 (!) 129/55 -- -- 96 (!) 26 93 % --   O/e Elthargic NG tube Rt IJ HD Chest b/l air entry Hs: irregular Abd - lap surgical site clean Foley Left flank drain- no output    CBC Latest Ref Rng & Units 07/28/2021 07/27/2021 07/26/2021  WBC 4.0 - 10.5 K/uL 11.2(H) 13.3(H) 14.4(H)  Hemoglobin 12.0 - 15.0 g/dL 7.8(L) 8.2(L) 6.9(L)  Hematocrit 36.0 - 46.0 % 25.0(L) 25.2(L) 21.4(L)  Platelets 150  - 400 K/uL 355 321 247     CMP Latest Ref Rng & Units 07/29/2021 07/28/2021 07/27/2021  Glucose 70 - 99 mg/dL 216(H) 120(H) 148(H)  BUN 8 - 23 mg/dL 87(H) 88(H) 85(H)  Creatinine 0.44 - 1.00 mg/dL 2.26(H) 2.26(H) 2.38(H)  Sodium 135 - 145 mmol/L 146(H) 144 142  Potassium 3.5 - 5.1 mmol/L 3.3(L) 3.5 3.3(L)  Chloride 98 - 111 mmol/L 108 107 105  CO2 22 - 32 mmol/L 25 28 23   Calcium 8.9 - 10.3 mg/dL 9.3 9.1 9.1  Total Protein 6.5 - 8.1 g/dL - 6.7 -  Total Bilirubin 0.3 - 1.2 mg/dL - 0.7 -  Alkaline Phos 38 - 126 U/L - 98 -  AST 15 - 41 U/L - 19 -  ALT 0 - 44 U/L - 31 -     LDA Left renal capsular drain  07/21/21 Foley catheter since 07/20/21 HD cath rt IJ since 07/13/21   Impression recommendation  Complicated UTI Left perirenal and renal abscess- drain placed on 07/21/21- culture positive for candida glabrata-  B/l renal stents- b/l renal infection with candida glabrata- on amphotericin since 07/20/21  Intermittent fever- none in the past 36 hrs- this is likely due to left renal abscess that has not been drained completely The staph epidermidis 1 of 4 bottle is a contaminant and it does not need any treatment I discontinued vanco order and no further blood culture is needed Please contact me before starting any antibiotic as she is on amphotericin and we do not want to give other nephrotoxic drugs like vancomycin unnecessarily. Discussed with IR-Dr.Yamagata  will repeat CT abdomen to look at the size of the renal abscess as ultrasound is not a good study Depending on the findings repositioning of the drian VS new drain placement  Duodenal perf- s/p lap and closure AKI on CKD on intermittent dialysis  Anemia   Discussed the management with hospitalist and her nurse

## 2021-07-29 NOTE — Progress Notes (Signed)
PROGRESS NOTE    Heather Lopez  HGD:[id number] DOB: 1946/02/03 DOA: 06/27/2021 PCP: [name], PA-C   Chief complaint.  Shortness of breath. Brief Narrative:   Heather Lopez is a 75 y.o. female with history of hypertension, diabetes mellitus, hyperlipidemia has been feeling weak for the last 3 to 4 days.  Patient had a acute renal failure with creatinine of 9.3, potassium 7.5, acute metabolic acidosis with pH 7.15, procalcitonin 6.83.  Patient was diagnosed was septic shock, acute respiratory failure. On night of 8/4, patient developed acute abdominal pain, CT scan showed acute abdomen secondary to perforated duodenum.  Patient was placed on mechanical ventilation after duodenum repair. Patient had a bilateral hydronephrosis, ureteral stents was placed on 8/10. Patient urine culture was positive for Candida glabrata.  ID consult was obtained, patient currently on high-dose of Diflucan. 8/26. Due to persistent fever, CT scan of abdomen/pelvis was repeated on 8/26, aspiration pararenal fluids was obtained, removed 5 mL of pus.  ID changed antibiotic to Amphotericin in addition to Zosyn  8/31-Hg 6.9. discussed with pt and later with husband about transfusion, risk/complications,  and they would like to proceed. 9/1 Hg 8.2.  Per nursing patient was febrile at 101.4 this a.m. was given Tylenol. 9/2- husband at bedside. No issues reported. Reports she is conversating .  In IR for PEG but unable to get to her today, plan for peg placement on Tuesday morning. 9/3 opens eyes when spoken to.  States she is hanging in there.  Husband at bedside.  Urology was reconsulted by me this AM for the renal abscess    Assessment & Plan:   Active Problems:   Uncontrolled type 2 diabetes mellitus with insulin therapy ([place])   Hyperlipidemia associated with type 2 diabetes mellitus ([place])   Type 2 diabetes mellitus with stage 4 chronic kidney disease, with long-term current use of insulin (HCC)   Acute respiratory  failure with hypoxia (HCC)   Severe sepsis with septic shock (HCC)   Acute lower UTI   Acute kidney injury superimposed on chronic kidney disease (HCC)   Pneumoperitoneum   Pressure injury of skin   Atrial flutter with rapid ventricular response (HCC)   Endotracheally intubated   Bilateral nephrolithiasis   Anemia of chronic disease   Demand ischemia of myocardium (HCC)   Lethargy   Duodenal ulcer perforation ([place])   Renal abscess   Essential hypertension   Hypokalemia  Acute metabolic encephalopathy. Sepsis with septic shock secondary to obstructive uropathy, kidney stone.  Status post bilateral ureteral stents. Candida urinary tract infection Perirenal abscess. Perforated duodenum. S/p repair. Pararenal drain by IR only removed 5 mL, cultures so far has no growth.  Repeat ultrasound still has significant pararenal abscess. This thought to be secondary to fungal infection with Candida glabrata.  Patient is on Amphotericin per ID. Patient also s/p bilateral ureteral stents.  Last CT scan showed persistent bilateral hydronephrosis, urology reconsulted, believe this is due to urinary retention.  Foley catheter was anchored. As for patient duodenal perforation, patient had surgical repair, condition had 9/1- tolerating TF Per husband MS not as good as yesterday.  9/2 unable to do PEG placement by IR today due to scheduling.  Plan is to do it on Tuesday.  N.p.o. Monday night 9/3IR consulted for repositioning of drainage of left perinephric abscess (abscess drained on August 26) they recommended CT renal without contrast first Urology input was appreciated. Bilateral hydro and retention.  Status post stenting with no residual hydro and  good urine output per Foley  Fever -on 9/1 Checks x-ray cannot rule out pna Likely from [place] nephric abscess see above ID following 1 blood culture with staph epi likely contaminant    Acute hypoxemic respiratory failure secondary to septic  shock. Morbid obesity and obesity hypoventilation syndrome  Patient has persistent hypoxemia, she had increased oxygen requirement yesterday, she was using 90% of heated high flow.  She was started on IV Lasix, oxygen was down to 40%.  She most likely has acute on chronic diastolic congestive heart failure.  Her echo on 06/27/2021 showed ejection fraction 50 to 55%. 9/1 continue IV Lasix Wean oxygen as tolerated   Acute kidney injury on chronic kidney disease stage IV. Hypokalemia. Patient is followed by nephrology, initially dialyzed, she has not been requiring hemodialysis for the last few days. 9/3 creatinine stabilizing at 2.26  No acute indication for hemodialysis  Replace electrolytes       Iron deficiency anemia Likely acute blood loss anemia. Possible GI bleed. Patient has been transfused, also received IV iron.  No B12 deficiency.  She has  black stool from the rectal tube.  GI consult is obtained.  Continued on Protonix IV twice a day. GI is not considering EGD due to high risk of procedure and  patient has severe hypoxemia. 8/31 will transfuse 1 unit prbc today Monitor cbc   Uncontrolled type 2 diabetes with hyperglycemia BG stable Continue RISS   Dysphagia. Patient has significant dysphagia, has not been able to take any p.o.  Currently receiving tube feeding through NG tube.  Discussed with GI, family still request a PEG tube placement.  However, due to hypoxemia, the procedure was deemed to be high risk. PEG tube on Tuesday by IR  DVT prophylaxis: Heparin Code Status: DNR Family Communication:husband at bedside Disposition Plan:    Status is: Inpatient  Remains inpatient appropriate because:IV treatments appropriate due to intensity of illness or inability to take PO and Inpatient level of care appropriate due to severity of illness  Dispo: The patient is from: Home              Anticipated d/c is to: LTAC              Patient currently is not medically  stable to d/c.   Difficult to place patient No  Patient has been accepted to [facility] in [place].  Getting transfusion.      I/O last 3 completed shifts: In: 3202.6 [I.V.:1151.9; NG/GT:375; IV Piggyback:1675.7] Out: 5300 [Urine:5100; Stool:200] Total I/O In: 945 [I.V.:55; NG/GT:890] Out: 1890 [Urine:1875; Drains:15]     Consultants:  Nephrology, ID  Procedures: Duodenum, bilateral ureteral stents.  Antimicrobials:  Amphotericin Subjective: Denies shortness of breath, chest pain, dizziness or lightheadedness while lying in bed Objective: Vitals:   07/29/21 1500 07/29/21 1600 07/29/21 1700 07/29/21 1800  BP: (!) 136/52 (!) 139/57 (!) 145/46 (!) 151/48  Pulse: 90 95 96 99  Resp: 20 (!) 27 (!) 26 (!) 24  Temp:  97.8 F (36.6 C)    TempSrc:  Oral    SpO2: 98% 94% 96% 98%  Weight:      Height:        Intake/Output Summary (Last 24 hours) at 07/29/2021 1812 Last data filed at 07/29/2021 1800 Gross per 24 hour  Intake 2349.24 ml  Output 3790 ml  Net -1440.76 ml   Filed Weights   07/22/21 0500 07/27/21 0325 07/29/21 0500  Weight: 125.9 kg (!) 151 kg (!) 148 kg  Examination: Nad, calm. Responds to my question Anteriorly cta no w/r Regular s1/s2 no gallop Soft distended mildly +bs Mild edema Mood and affect appropriate in current setting   Data Reviewed: I have personally reviewed following labs and imaging studies  CBC: Recent Labs  Lab 07/23/21 0634 07/24/21 0629 07/25/21 0616 07/26/21 0618 07/27/21 0950 07/28/21 0602  WBC 15.2* 15.5* 14.9* 14.4* 13.3* 11.2*  NEUTROABS 11.3* 12.4* 12.2* 11.9*  --   --   HGB 5.8* 7.2* 8.1* 6.9* 8.2* 7.8*  HCT 18.6* 21.1* 24.6* 21.4* 25.2* 25.0*  MCV 87.3 86.8 87.5 89.9 90.0 89.3  PLT 274 225 252 247 321 330   Basic Metabolic Panel: Recent Labs  Lab 07/24/21 0629 07/24/21 1642 07/25/21 0616 07/26/21 0618 07/27/21 0649 07/28/21 0602 07/29/21 0615  NA 135  --  138 143 142 144 146*  K 3.1*   < > 3.4* 3.3* 3.3*  3.5 3.3*  CL 101  --  101 106 105 107 108  CO2 23  --  22 24 23 28 25   GLUCOSE 96  --  156* 114* 148* 120* 216*  BUN 65*  --  70* 78* 85* 88* 87*  CREATININE 2.78*  --  2.88* 2.64* 2.38* 2.26* 2.26*  CALCIUM 8.6*  --  8.9 9.0 9.1 9.1 9.3  MG 1.9  --  2.0 2.1  --  2.0 2.1  PHOS  --   --   --  5.0*  --  4.5 3.9   < > = values in this interval not displayed.   GFR: Estimated Creatinine Clearance: 30.8 mL/min (A) (by C-G formula based on SCr of 2.26 mg/dL (H)). Liver Function Tests: Recent Labs  Lab 07/28/21 0602  AST 19  ALT 31  ALKPHOS 98  BILITOT 0.7  PROT 6.7  ALBUMIN 2.0*   No results for input(s): LIPASE, AMYLASE in the last 168 hours. Recent Labs  Lab 07/28/21 0602  AMMONIA 11   Coagulation Profile: No results for input(s): INR, PROTIME in the last 168 hours. Cardiac Enzymes: No results for input(s): CKTOTAL, CKMB, CKMBINDEX, TROPONINI in the last 168 hours. BNP (last 3 results) No results for input(s): PROBNP in the last 8760 hours. HbA1C: No results for input(s): HGBA1C in the last 72 hours. CBG: Recent Labs  Lab 07/28/21 2315 07/29/21 0340 07/29/21 0746 07/29/21 1141 07/29/21 1547  GLUCAP 223* 194* 181* 149* 137*   Lipid Profile: No results for input(s): CHOL, HDL, LDLCALC, TRIG, CHOLHDL, LDLDIRECT in the last 72 hours. Thyroid Function Tests: No results for input(s): TSH, T4TOTAL, FREET4, T3FREE, THYROIDAB in the last 72 hours. Anemia Panel: No results for input(s): VITAMINB12, FOLATE, FERRITIN, TIBC, IRON, RETICCTPCT in the last 72 hours. Sepsis Labs: No results for input(s): PROCALCITON, LATICACIDVEN in the last 168 hours.  Recent Results (from the past 240 hour(s))  Body fluid culture w Gram Stain     Status: None (Preliminary result)   Collection Time: 07/21/21  1:23 PM   Specimen: Peritoneal Washings; Body Fluid  Result Value Ref Range Status   Specimen Description   Final    PERITONEAL Performed at [facility], [address] [place], [place] [address]    Special Requests   Final    NONE Performed at [facility], [address] [place], [place] [address]    Gram Stain   Final    MODERATE WBC PRESENT,BOTH PMN AND MONONUCLEAR NO ORGANISMS SEEN    Culture   Final    RARE CANDIDA  GLABRATA Sent to [place] for further susceptibility testing. Performed at [facility], [place] [address], [place], [place] [address]    Report Status PENDING  Incomplete  Antifungal AST 9 Drug Panel     Status: None (Preliminary result)   Collection Time: 07/21/21  1:23 PM  Result Value Ref Range Status   Organism ID, Yeast Preliminary report  Final    Comment: (NOTE) Specimen has been received and testing has been initiated. Performed At: [facility] [place], [place] [id number] Rush Farmer MD JK:[id number]    Amphotericin B MIC PENDING  Incomplete   Anidulafungin MIC PENDING  Incomplete   Caspofungin MIC PENDING  Incomplete   Micafungin MIC PENDING  Incomplete   Posaconazole MIC PENDING  Incomplete   Fluconazole Islt MIC PENDING  Incomplete   Flucytosine MIC PENDING  Incomplete   Itraconazole MIC PENDING  Incomplete   Voriconazole MIC PENDING  Incomplete   Source CANDIDA GLABRATA/ PERITONEAL FLUID  Final    Comment: Performed at [facility], [place]. [address] [place], [place] [address]  Aerobic/Anaerobic Culture w Gram Stain (surgical/deep wound)     Status: None   Collection Time: 07/21/21  3:32 PM   Specimen: Abdomen  Result Value Ref Range Status   Specimen Description   Final    ABDOMEN Performed at [facility], [address] [place], [place] [address]    Special Requests   Final    NONE Performed at [facility], [address] [place], [place] [address]    Gram Stain   Final    MODERATE SQUAMOUS EPITHELIAL CELLS PRESENT WBC PRESENT, PREDOMINANTLY PMN NO ORGANISMS SEEN    Culture   Final    RARE CANDIDA GLABRATA NO ANAEROBES  ISOLATED Performed at [facility], [place] [address] [place], [place] [address]    Report Status 07/26/2021 FINAL  Final  CULTURE, BLOOD (ROUTINE X 2) w Reflex to ID Panel     Status: None (Preliminary result)   Collection Time: 07/27/21  4:55 PM   Specimen: BLOOD  Result Value Ref Range Status   Specimen Description BLOOD BLOOD LEFT HAND  Final   Special Requests   Final    BOTTLES DRAWN AEROBIC AND ANAEROBIC Blood Culture results may not be optimal due to an inadequate volume of blood received in culture bottles   Culture  Setup Time   Final    Organism ID to follow GRAM POSITIVE COCCI AEROBIC BOTTLE ONLY CRITICAL RESULT CALLED TO, READ BACK BY AND VERIFIED WITH: [employer] W[place] AT 1940 BY SS Performed at [facility], [address] [place], [place] [address]    Culture GRAM POSITIVE COCCI  Final   Report Status PENDING  Incomplete  Blood Culture ID Panel (Reflexed)     Status: Abnormal   Collection Time: 07/27/21  4:55 PM  Result Value Ref Range Status   Enterococcus faecalis NOT DETECTED NOT DETECTED Final   Enterococcus Faecium NOT DETECTED NOT DETECTED Final   Listeria monocytogenes NOT DETECTED NOT DETECTED Final   Staphylococcus species DETECTED (A) NOT DETECTED Final    Comment: CRITICAL RESULT CALLED TO, READ BACK BY AND VERIFIED WITH: [place] ON 07/28/21 BY SS    Staphylococcus aureus (BCID) NOT DETECTED NOT DETECTED Final   Staphylococcus epidermidis DETECTED (A) NOT DETECTED Final    Comment: Methicillin (oxacillin) resistant coagulase negative staphylococcus. Possible blood culture contaminant (unless isolated from more than one blood culture  draw or clinical case suggests pathogenicity). No antibiotic treatment is indicated for blood  culture contaminants. CRITICAL RESULT CALLED TO, READ BACK BY AND VERIFIED WITH: [place] ON 07/28/21 BY SS    Staphylococcus lugdunensis NOT DETECTED NOT DETECTED Final   Streptococcus species  NOT DETECTED NOT DETECTED Final   Streptococcus agalactiae NOT DETECTED NOT DETECTED Final   Streptococcus pneumoniae NOT DETECTED NOT DETECTED Final   Streptococcus pyogenes NOT DETECTED NOT DETECTED Final   A.calcoaceticus-baumannii NOT DETECTED NOT DETECTED Final   Bacteroides fragilis NOT DETECTED NOT DETECTED Final   Enterobacterales NOT DETECTED NOT DETECTED Final   Enterobacter cloacae complex NOT DETECTED NOT DETECTED Final   Escherichia coli NOT DETECTED NOT DETECTED Final   Klebsiella aerogenes NOT DETECTED NOT DETECTED Final   Klebsiella oxytoca NOT DETECTED NOT DETECTED Final   Klebsiella pneumoniae NOT DETECTED NOT DETECTED Final   Proteus species NOT DETECTED NOT DETECTED Final   Salmonella species NOT DETECTED NOT DETECTED Final   Serratia marcescens NOT DETECTED NOT DETECTED Final   Haemophilus influenzae NOT DETECTED NOT DETECTED Final   Neisseria meningitidis NOT DETECTED NOT DETECTED Final   Pseudomonas aeruginosa NOT DETECTED NOT DETECTED Final   Stenotrophomonas maltophilia NOT DETECTED NOT DETECTED Final   Candida albicans NOT DETECTED NOT DETECTED Final   Candida auris NOT DETECTED NOT DETECTED Final   Candida glabrata NOT DETECTED NOT DETECTED Final   Candida krusei NOT DETECTED NOT DETECTED Final   Candida parapsilosis NOT DETECTED NOT DETECTED Final   Candida tropicalis NOT DETECTED NOT DETECTED Final   Cryptococcus neoformans/gattii NOT DETECTED NOT DETECTED Final   Methicillin resistance mecA/C DETECTED (A) NOT DETECTED Final    Comment: CRITICAL RESULT CALLED TO, READ BACK BY AND VERIFIED WITH: [place] ON 07/28/21 BY SS Performed at [facility], [address] [place], [place] [address]   CULTURE, BLOOD (ROUTINE X 2) w Reflex to ID Panel     Status: None (Preliminary result)   Collection Time: 07/27/21  4:56 PM   Specimen: BLOOD  Result Value Ref Range Status   Specimen Description BLOOD BLOOD RIGHT HAND  Final   Special  Requests   Final    BOTTLES DRAWN AEROBIC AND ANAEROBIC Blood Culture adequate volume   Culture   Final    NO GROWTH 2 DAYS Performed at [facility], [address] [place], [place] [address]    Report Status PENDING  Incomplete  CULTURE, BLOOD (ROUTINE X 2) w Reflex to ID Panel     Status: None (Preliminary result)   Collection Time: 07/28/21  8:59 PM   Specimen: BLOOD  Result Value Ref Range Status   Specimen Description BLOOD BLOOD RIGHT HAND  Final   Special Requests   Final    BOTTLES DRAWN AEROBIC AND ANAEROBIC Blood Culture results may not be optimal due to an inadequate volume of blood received in culture bottles   Culture   Final    NO GROWTH < 12 HOURS Performed at [facility], [address], [place], [place] [address]    Report Status PENDING  Incomplete         Radiology Studies: US RENAL  Result Date: 07/28/2021 CLINICAL DATA:  Status post renal abscess drainage. EXAM: RENAL / URINARY TRACT ULTRASOUND COMPLETE COMPARISON:  07/24/2021 FINDINGS: Right Kidney: Renal measurements: 14.4 x 5.8 x 6.3 cm = volume: 28 cc. Mild fullness noted right intrarenal collecting system with shadowing 15 mm lower pole stone.  Left Kidney: Renal measurements: 14.4 x 7.2 x 5.1 cm = volume: 28 mL. Complex collection again identified adjacent to the left kidney measuring similarly at 10.0 X 3.8 x 7.9 cm compared to 9.7 x 2.4 x 6.7 cm previously. Bladder: Appears normal for degree of bladder distention. Other: None. IMPRESSION: No substantial change complex fluid collection adjacent to the left kidney compatible with reported history of abscess. Electronically Signed   By: Misty Stanley M.D.   On: 07/28/2021 11:21        Scheduled Meds:  sodium chloride   Intravenous Once   sodium chloride   Intravenous Once   acetaminophen (TYLENOL) oral liquid 160 mg/5 mL  650 mg Per Tube Q24H   amLODipine  10 mg Per Tube Daily   chlorhexidine  15 mL Mouth Rinse BID   Chlorhexidine  Gluconate Cloth  6 each Topical Q0600   cloNIDine  0.1 mg Per Tube Daily   collagenase   Topical Daily   dextrose  10 mL Intravenous Q24H   dextrose  10 mL Intravenous Q24H   epoetin (EPOGEN/PROCRIT) injection  20,000 Units Subcutaneous Weekly   free water  125 mL Per Tube Q4H   furosemide  80 mg Intravenous Q12H   haloperidol lactate  1 mg Intravenous Once   heparin injection (subcutaneous)  5,000 Units Subcutaneous Q8H   insulin aspart  0-20 Units Subcutaneous Q4H   insulin aspart  6 Units Subcutaneous Q4H   insulin detemir  30 Units Subcutaneous BID   mouth rinse  15 mL Mouth Rinse q12n4p   metoprolol tartrate  100 mg Per Tube BID   multivitamin  1 tablet Per Tube QHS   nutrition supplement (JUVEN)  1 packet Per Tube BID BM   pantoprazole (PROTONIX) IV  40 mg Intravenous Q12H   sodium chloride  500 mL Intravenous Q24H   sodium chloride  500 mL Intravenous Q24H   sodium chloride flush  5 mL Intracatheter Q8H   Continuous Infusions:  sodium chloride 5 mL/hr at 07/29/21 1800   amphotericin B  Conventional (FUNGIZONE) IV Stopped (07/29/21 0304)   feeding supplement (GLUCERNA 1.5 CAL) 1,000 mL (07/24/21 1656)   feeding supplement (GLUCERNA 1.5 CAL)       LOS: 32 days    Time spent: 35 minutes with >50% on coc    Nolberto Hanlon, MD [facility]   To contact the attending provider between 7A-7P or the covering provider during after hours 7P-7A, please log into the web site [email or web address] and access using universal [place] password for that web site. If you do not have the password, please call the hospital operator.  07/29/2021, 6:12 PM

## 2021-07-29 NOTE — Progress Notes (Addendum)
Pharmacy Antibiotic Note  Heather Lopez is a 75 y.o. female admitted on 06/27/2021 with  severe sepsis . During admission patient was found to have complicated UTI. Now with positive blood culture of 1 of 4 bottles with Staph epi. Pharmacy has been consulted for Vancomcyin dosing.   Plan: Pt received vancomycin 1750 mg x 1 on 9/2. Will order vancomycin 1250 mg q48H. Predicted AUC 450. Goal AUC 400-550. Vd 0.5, Scr 2.26. Scr fairly stable. If it starts to trend up recommend ordering random levels. Plan to obtain peak and trough prior to the 3rd-4th dose due to q48H dosing.   Addendum: Per ID d/c vancomycin.   Height: 5' 2.01" (157.5 cm) Weight: (!) 148 kg (326 lb 3.2 oz) IBW/kg (Calculated) : 50.12  Temp (24hrs), Avg:99.1 F (37.3 C), Min:98.9 F (37.2 C), Max:99.2 F (37.3 C)  Recent Labs  Lab 07/24/21 0629 07/25/21 0616 07/26/21 0618 07/27/21 0649 07/27/21 0950 07/28/21 0602 07/29/21 0615  WBC 15.5* 14.9* 14.4*  --  13.3* 11.2*  --   CREATININE 2.78* 2.88* 2.64* 2.38*  --  2.26* 2.26*     Estimated Creatinine Clearance: 30.8 mL/min (A) (by C-G formula based on SCr of 2.26 mg/dL (H)).    Allergies  Allergen Reactions   Contrast Media  [Iodinated Diagnostic Agents] Anaphylaxis   Iodine Swelling    (IV only) - angioedema    Antimicrobials this admission: Amphotericin 8/26 >>  Vancomycin 9/2 >>   Dose adjustments this admission:  Microbiology results: 9/1 BCx: 1/4 GPC, BCID - Staph epi, Mec A/C + 9/2 BCx: pending   Thank you for allowing pharmacy to be a part of this patient's care.  Eleonore Chiquito, PharmD, BCPS 07/29/2021 9:13 AM

## 2021-07-29 NOTE — Progress Notes (Addendum)
Oral contrast x2 bottles delivered from CT.... instructions were to give half the bottle , wait 30 min each time until both bottles are finished.   Started at 1730 and will finish around 1930... will pass onto night shift RN to take pt down for CT.

## 2021-07-30 LAB — ANTIFUNGAL AST 9 DRUG PANEL
Amphotericin B MIC: 0.5
Fluconazole Islt MIC: 4
Flucytosine MIC: 0.06
Itraconazole MIC: 0.25
Posaconazole MIC: 0.25
Voriconazole MIC: 0.06

## 2021-07-30 LAB — CBC
HCT: 24.1 % — ABNORMAL LOW (ref 36.0–46.0)
Hemoglobin: 7.8 g/dL — ABNORMAL LOW (ref 12.0–15.0)
MCH: 29.9 pg (ref 26.0–34.0)
MCHC: 32.4 g/dL (ref 30.0–36.0)
MCV: 92.3 fL (ref 80.0–100.0)
Platelets: 402 10*3/uL — ABNORMAL HIGH (ref 150–400)
RBC: 2.61 MIL/uL — ABNORMAL LOW (ref 3.87–5.11)
RDW: 15.9 % — ABNORMAL HIGH (ref 11.5–15.5)
WBC: 9.6 10*3/uL (ref 4.0–10.5)
nRBC: 0.2 % (ref 0.0–0.2)

## 2021-07-30 LAB — GLUCOSE, CAPILLARY
Glucose-Capillary: 168 mg/dL — ABNORMAL HIGH (ref 70–99)
Glucose-Capillary: 171 mg/dL — ABNORMAL HIGH (ref 70–99)
Glucose-Capillary: 181 mg/dL — ABNORMAL HIGH (ref 70–99)
Glucose-Capillary: 184 mg/dL — ABNORMAL HIGH (ref 70–99)
Glucose-Capillary: 184 mg/dL — ABNORMAL HIGH (ref 70–99)
Glucose-Capillary: 187 mg/dL — ABNORMAL HIGH (ref 70–99)

## 2021-07-30 LAB — MAGNESIUM: Magnesium: 2.2 mg/dL (ref 1.7–2.4)

## 2021-07-30 LAB — POTASSIUM: Potassium: 3.8 mmol/L (ref 3.5–5.1)

## 2021-07-30 LAB — PHOSPHORUS: Phosphorus: 4.3 mg/dL (ref 2.5–4.6)

## 2021-07-30 NOTE — Progress Notes (Signed)
[facility]  ROUNDING NOTE   Subjective:   Heather Lopez is a 75 y.o. white female who was admitted for community acquired pneumonia on 06/27/2021     Hospital course complicated by duodenal perforation s/p repair on 06/30/2021 Left obstructive hydronephrosis and renal abscess, Patient underwent cystoscopy and b/l ureteral stent placement 8/10 8/26- CT guided aspiration of perirenal abscess- 5 ml purulent fluid aspirated Cultures neg so far  Lab Results  Component Value Date   CREATININE 2.26 (H) 07/29/2021   CREATININE 2.26 (H) 07/28/2021   CREATININE 2.38 (H) 07/27/2021   09/03 0701 - 09/04 0700 In: 2557.9 [I.V.:1107.8; NG/GT:950; IV Piggyback:500.1] Out: 3490 [Urine:3475; Drains:15]   No new renal function testing performed today. Good urine output of 3.4 L over the preceding 24 hours. Still on high flow nasal cannula this AM.   Objective:  Vital signs in last 24 hours:  Temp:  [97.8 F (36.6 C)-98.9 F (37.2 C)] 98.5 F (36.9 C) (09/04 1200) Pulse Rate:  [88-106] 94 (09/04 1200) Resp:  [14-31] 14 (09/04 1200) BP: (116-243)/(46-221) 116/88 (09/04 1200) SpO2:  [92 %-100 %] 98 % (09/04 1200) FiO2 (%):  [40 %-60 %] 40 % (09/04 0730) Weight:  [149.5 kg] 149.5 kg (09/04 0500)  Weight change: 1.497 kg Filed Weights   07/27/21 0325 07/29/21 0500 07/30/21 0500  Weight: (!) 151 kg (!) 148 kg (!) 149.5 kg    Intake/Output: I/O last 3 completed shifts: In: 3962.2 [I.V.:2161.6; NG/GT:950; IV Piggyback:850.6] Out: 3614 [ERXVQ:0086; Drains:15]   Intake/Output this shift:  Total I/O In: 245 [I.V.:15; NG/GT:230] Out: 975 [Urine:975]  Physical Exam: General: No acute distress  Head: HFNC O2, NGT in place  Eyes: Anicteric  Lungs:  +coarse breath sounds  Heart: S1S2 on rubs  Abdomen:  midline incision  Extremities: + peripheral edema   Neurologic: Awake, alert, follows commands  GU Foley with yellow urine  Access:  LIJ permcath 8/18  Rectal tube  Basic  Metabolic Panel: Recent Labs  Lab 07/25/21 0616 07/26/21 0618 07/27/21 0649 07/28/21 0602 07/29/21 0615 07/30/21 0626  NA 138 143 142 144 146*  --   K 3.4* 3.3* 3.3* 3.5 3.3* 3.8  CL 101 106 105 107 108  --   CO2 22 24 23 28 25   --   GLUCOSE 156* 114* 148* 120* 216*  --   BUN 70* 78* 85* 88* 87*  --   CREATININE 2.88* 2.64* 2.38* 2.26* 2.26*  --   CALCIUM 8.9 9.0 9.1 9.1 9.3  --   MG 2.0 2.1  --  2.0 2.1 2.2  PHOS  --  5.0*  --  4.5 3.9 4.3     Liver Function Tests: Recent Labs  Lab 07/28/21 0602  AST 19  ALT 31  ALKPHOS 98  BILITOT 0.7  PROT 6.7  ALBUMIN 2.0*    No results for input(s): LIPASE, AMYLASE in the last 168 hours. Recent Labs  Lab 07/28/21 0602  AMMONIA 11     CBC: Recent Labs  Lab 07/24/21 0629 07/25/21 0616 07/26/21 0618 07/27/21 0950 07/28/21 0602  WBC 15.5* 14.9* 14.4* 13.3* 11.2*  NEUTROABS 12.4* 12.2* 11.9*  --   --   HGB 7.2* 8.1* 6.9* 8.2* 7.8*  HCT 21.1* 24.6* 21.4* 25.2* 25.0*  MCV 86.8 87.5 89.9 90.0 89.3  PLT 225 252 247 321 355     Cardiac Enzymes: No results for input(s): CKTOTAL, CKMB, CKMBINDEX, TROPONINI in the last 168 hours.  BNP: Invalid input(s): POCBNP  CBG: Recent Labs  Lab 07/29/21 1958 07/29/21 2337 07/30/21 0340 07/30/21 0746 07/30/21 1126  GLUCAP 136* 191* 184* 187* 171*     Microbiology: Results for orders placed or performed during the hospital encounter of 06/27/21  Resp Panel by RT-PCR (Flu A&B, Covid) Nasopharyngeal Swab     Status: None   Collection Time: 06/27/21 12:32 AM   Specimen: Nasopharyngeal Swab; Nasopharyngeal(NP) swabs in vial transport medium  Result Value Ref Range Status   SARS Coronavirus 2 by RT PCR NEGATIVE NEGATIVE Final    Comment: (NOTE) SARS-CoV-2 target nucleic acids are NOT DETECTED.  The SARS-CoV-2 RNA is generally detectable in upper respiratory specimens during the acute phase of infection. The lowest concentration of SARS-CoV-2 viral copies this assay can  detect is 138 copies/mL. A negative result does not preclude SARS-Cov-2 infection and should not be used as the sole basis for treatment or other patient management decisions. A negative result may occur with  improper specimen collection/handling, submission of specimen other than nasopharyngeal swab, presence of viral mutation(s) within the areas targeted by this assay, and inadequate number of viral copies(<138 copies/mL). A negative result must be combined with clinical observations, patient history, and epidemiological information. The expected result is Negative.  Fact Sheet for Patients:  EntrepreneurPulse.com.au  Fact Sheet for Healthcare Providers:  IncredibleEmployment.be  This test is no t yet approved or cleared by the [place] FDA and  has been authorized for detection and/or diagnosis of SARS-CoV-2 by FDA under an Emergency Use Authorization (EUA). This EUA will remain  in effect (meaning this test can be used) for the duration of the COVID-19 declaration under Section 564(b)(1) of the Act, 21 U.S.C.section 360bbb-3(b)(1), unless the authorization is terminated  or revoked sooner.       Influenza A by PCR NEGATIVE NEGATIVE Final   Influenza B by PCR NEGATIVE NEGATIVE Final    Comment: (NOTE) The Xpert Xpress SARS-CoV-2/FLU/RSV plus assay is intended as an aid in the diagnosis of influenza from Nasopharyngeal swab specimens and should not be used as a sole basis for treatment. Nasal washings and aspirates are unacceptable for Xpert Xpress SARS-CoV-2/FLU/RSV testing.  Fact Sheet for Patients: EntrepreneurPulse.com.au  Fact Sheet for Healthcare Providers: IncredibleEmployment.be  This test is not yet approved or cleared by the [place] FDA and has been authorized for detection and/or diagnosis of SARS-CoV-2 by FDA under an Emergency Use Authorization (EUA). This EUA will remain in  effect (meaning this test can be used) for the duration of the COVID-19 declaration under Section 564(b)(1) of the Act, 21 U.S.C. section 360bbb-3(b)(1), unless the authorization is terminated or revoked.  Performed at [facility], [address] [place], [place] [address]   Blood culture (routine x 2)     Status: None   Collection Time: 06/27/21 12:43 AM   Specimen: BLOOD  Result Value Ref Range Status   Specimen Description BLOOD RIGHT ASSIST CONTROL  Final   Special Requests   Final    BOTTLES DRAWN AEROBIC AND ANAEROBIC Blood Culture results may not be optimal due to an inadequate volume of blood received in culture bottles   Culture   Final    NO GROWTH 5 DAYS Performed at [facility], [address] [place], [place] [address]    Report Status 07/02/2021 FINAL  Final  Blood culture (routine x 2)     Status: None   Collection Time: 06/27/21  1:45 AM   Specimen: BLOOD  Result Value Ref Range Status  Specimen Description BLOOD RIGHT ASSIST CONTROL  Final   Special Requests   Final    BOTTLES DRAWN AEROBIC AND ANAEROBIC Blood Culture adequate volume   Culture   Final    NO GROWTH 5 DAYS Performed at [facility], [address] [place], [place] [address]    Report Status 07/02/2021 FINAL  Final  Urine Culture     Status: Abnormal   Collection Time: 06/27/21  2:39 AM   Specimen: In/Out Cath Urine  Result Value Ref Range Status   Specimen Description   Final    IN/OUT CATH URINE Performed at [facility], [address] [place], [place] [address]    Special Requests   Final    NONE Performed at [facility], [address] [place], [place] [address]    Culture 30,000 COLONIES/mL YEAST (A)  Final   Report Status 06/28/2021 FINAL  Final  MRSA Next Gen by PCR, Nasal     Status: None   Collection Time: 06/30/21  5:01 AM   Specimen: Nasal Mucosa; Nasal Swab  Result Value Ref Range Status   MRSA by PCR Next Gen NOT  DETECTED NOT DETECTED Final    Comment: (NOTE) The GeneXpert MRSA Assay (FDA approved for NASAL specimens only), is one component of a comprehensive MRSA colonization surveillance program. It is not intended to diagnose MRSA infection nor to guide or monitor treatment for MRSA infections. Test performance is not FDA approved in patients less than 59 years old. Performed at [facility], [address], [place], [place] [address]   Urine Culture     Status: Abnormal   Collection Time: 07/05/21 10:36 AM   Specimen: PATH Other; Urine  Result Value Ref Range Status   Specimen Description   Final    URINE, RANDOM LEFT RENAL PELVIS URINE Performed at [facility], [address] [place], [place] [address]    Special Requests   Final    NONE Performed at [facility], [address] [place], [place] [address]    Culture 70,000 [place] (A)  Final   Report Status 07/12/2021 FINAL  Final  Urine Culture     Status: Abnormal   Collection Time: 07/05/21 10:48 AM   Specimen: PATH Other; Urine  Result Value Ref Range Status   Specimen Description   Final    URINE, RANDOM RIGHT KIDNEY URINE Performed at [facility], [address] [place], [place] [address]    Special Requests   Final    NONE Performed at [facility], [address], [place], [place] [address]    Culture (A)  Final    >=100,000 COLONIES/mL CANDIDA GLABRATA SEE SEPARATE REPORT Performed at [facility], [place] [address] [place], [place] [address]    Report Status 07/17/2021 FINAL  Final  Antifungal AST 9 Drug Panel     Status: None   Collection Time: 07/05/21 10:48 AM  Result Value Ref Range Status   Organism ID, Yeast Candida glabrata  Corrected    Comment: (NOTE) Identification performed by account, not confirmed by this laboratory. CORRECTED ON 08/21 AT 1535: PREVIOUSLY REPORTED AS Preliminary report    Amphotericin B MIC 0.5 ug/mL   Final    Comment: (NOTE) Breakpoints have been established for only some organism-drug combinations as indicated. This test was developed and its performance characteristics determined by [facility]. It has not been cleared or approved by the Food and Drug Administration.  Anidulafungin MIC Comment  Final    Comment: (NOTE) 0.03 ug/mL Susceptible Breakpoints have been established for only some organism-drug combinations as indicated. This test was developed and its performance characteristics determined by [facility]. It has not been cleared or approved by the Food and Drug Administration.    Caspofungin MIC Comment  Final    Comment: (NOTE) 0.06 ug/mL Susceptible Breakpoints have been established for only some organism-drug combinations as indicated. This test was developed and its performance characteristics determined by [facility]. It has not been cleared or approved by the Food and Drug Administration.    Micafungin MIC Comment  Final    Comment: (NOTE) 0.016 ug/mL Susceptible Breakpoints have been established for only some organism-drug combinations as indicated. This test was developed and its performance characteristics determined by [facility]. It has not been cleared or approved by the Food and Drug Administration.    Posaconazole MIC 0.25 ug/mL  Final    Comment: (NOTE) Breakpoints have been established for only some organism-drug combinations as indicated. This test was developed and its performance characteristics determined by [facility]. It has not been cleared or approved by the Food and Drug Administration.    Fluconazole Islt MIC 2.0 ug/mL  Final    Comment: (NOTE) Susceptible Dose Dependent Breakpoints have been established for only some organism-drug combinations as indicated. This test was developed and its performance characteristics determined by [facility]. It has not been cleared or approved by the Food and Drug Administration.    Flucytosine MIC 0.06  ug/mL or less  Final    Comment: (NOTE) Breakpoints have been established for only some organism-drug combinations as indicated. This test was developed and its performance characteristics determined by [facility]. It has not been cleared or approved by the Food and Drug Administration.    Itraconazole MIC 0.12 ug/mL  Final    Comment: (NOTE) Breakpoints have been established for only some organism-drug combinations as indicated. This test was developed and its performance characteristics determined by [facility]. It has not been cleared or approved by the Food and Drug Administration.    Voriconazole MIC 0.06 ug/mL  Final    Comment: (NOTE) Breakpoints have been established for only some organism-drug combinations as indicated. This test was developed and its performance characteristics determined by [facility]. It has not been cleared or approved by the Food and Drug Administration. Performed At: [facility] [address] [place], [place] [id number] Rush Farmer MD [id number]    Source CANDIDA GLABRATA SUSCEPTIBILITY URINE CULTURE  Final    Comment: Performed at [facility], [place] [address], [place], [place] [address]  CULTURE, BLOOD (ROUTINE X 2) w Reflex to ID Panel     Status: None   Collection Time: 07/15/21  7:57 PM   Specimen: BLOOD  Result Value Ref Range Status   Specimen Description BLOOD LEFT ASSIST CONTROL  Final   Special Requests   Final    BOTTLES DRAWN AEROBIC AND ANAEROBIC Blood Culture adequate volume   Culture   Final    NO GROWTH 5 DAYS Performed at [facility], [address] [place], [place] [address]    Report Status 07/20/2021 FINAL  Final  CULTURE, BLOOD (ROUTINE X 2) w Reflex to ID Panel     Status: None   Collection Time: 07/15/21  8:07 PM   Specimen: BLOOD  Result Value Ref Range Status   Specimen Description BLOOD LEFT FOREARM  Final   Special Requests   Final    BOTTLES DRAWN AEROBIC  AND ANAEROBIC Blood Culture  adequate volume   Culture   Final    NO GROWTH 5 DAYS Performed at [facility], [address] [place], [place] [address]    Report Status 07/20/2021 FINAL  Final  Body fluid culture w Gram Stain     Status: None (Preliminary result)   Collection Time: 07/21/21  1:23 PM   Specimen: Peritoneal Washings; Body Fluid  Result Value Ref Range Status   Specimen Description   Final    PERITONEAL Performed at [facility], [address] [place], [place] [address]    Special Requests   Final    NONE Performed at [facility], [address] [place], [place] [address]    Gram Stain   Final    MODERATE WBC PRESENT,BOTH PMN AND MONONUCLEAR NO ORGANISMS SEEN    Culture   Final    RARE CANDIDA GLABRATA Sent to [place] for further susceptibility testing. Performed at [facility], [place] [address] [place], [place] [address]    Report Status PENDING  Incomplete  Antifungal AST 9 Drug Panel     Status: None (Preliminary result)   Collection Time: 07/21/21  1:23 PM  Result Value Ref Range Status   Organism ID, Yeast Preliminary report  Final    Comment: (NOTE) Specimen has been received and testing has been initiated. Performed At: [facility] [place], [place] [id number] Rush Farmer MD [id number]    Amphotericin B MIC PENDING  Incomplete   Anidulafungin MIC PENDING  Incomplete   Caspofungin MIC PENDING  Incomplete   Micafungin MIC PENDING  Incomplete   Posaconazole MIC PENDING  Incomplete   Fluconazole Islt MIC PENDING  Incomplete   Flucytosine MIC PENDING  Incomplete   Itraconazole MIC PENDING  Incomplete   Voriconazole MIC PENDING  Incomplete   Source CANDIDA GLABRATA/ PERITONEAL FLUID  Final    Comment: Performed at [facility], [place] [place], [place] [address]  Aerobic/Anaerobic Culture w Gram Stain (surgical/deep wound)     Status: None   Collection Time: 07/21/21  3:32 PM   Specimen: Abdomen   Result Value Ref Range Status   Specimen Description   Final    ABDOMEN Performed at [facility], [address], [place], [place] [address]    Special Requests   Final    NONE Performed at [facility], [address] [place], [place] [address]    Gram Stain   Final    MODERATE SQUAMOUS EPITHELIAL CELLS PRESENT WBC PRESENT, PREDOMINANTLY PMN NO ORGANISMS SEEN    Culture   Final    RARE CANDIDA GLABRATA NO ANAEROBES ISOLATED Performed at [name], [place] [address], [place], [place] [address]    Report Status 07/26/2021 FINAL  Final  CULTURE, BLOOD (ROUTINE X 2) w Reflex to ID Panel     Status: Abnormal (Preliminary result)   Collection Time: 07/27/21  4:55 PM   Specimen: BLOOD  Result Value Ref Range Status   Specimen Description   Final    BLOOD BLOOD LEFT HAND Performed at [facility], [address] [place], [place] [address]    Special Requests   Final    BOTTLES DRAWN AEROBIC AND ANAEROBIC Blood Culture results may not be optimal due to an inadequate volume of blood received in culture bottles Performed at [facility], [address] [place], [place] [address]    Culture  Setup Time  Final    Organism ID to follow GRAM POSITIVE COCCI AEROBIC BOTTLE ONLY CRITICAL RESULT CALLED TO, READ BACK BY AND VERIFIED WITH: [employer] WATSON AT 1940 BY SS Performed at [facility], [address] [place], [place] [address]    Culture (A)  Final    STAPHYLOCOCCUS EPIDERMIDIS THE SIGNIFICANCE OF ISOLATING THIS ORGANISM FROM A SINGLE SET OF BLOOD CULTURES WHEN MULTIPLE SETS ARE DRAWN IS UNCERTAIN. PLEASE NOTIFY THE MICROBIOLOGY DEPARTMENT WITHIN ONE WEEK IF SPECIATION AND SENSITIVITIES ARE REQUIRED. Performed at [facility], [place] [address] [place], [place] [address]    Report Status PENDING  Incomplete  Blood Culture ID Panel (Reflexed)     Status: Abnormal   Collection Time: 07/27/21  4:55 PM  Result Value  Ref Range Status   Enterococcus faecalis NOT DETECTED NOT DETECTED Final   Enterococcus Faecium NOT DETECTED NOT DETECTED Final   Listeria monocytogenes NOT DETECTED NOT DETECTED Final   Staphylococcus species DETECTED (A) NOT DETECTED Final    Comment: CRITICAL RESULT CALLED TO, READ BACK BY AND VERIFIED WITH: SUZANE WATSON AT 1940 ON 07/28/21 BY SS    Staphylococcus aureus (BCID) NOT DETECTED NOT DETECTED Final   Staphylococcus epidermidis DETECTED (A) NOT DETECTED Final    Comment: Methicillin (oxacillin) resistant coagulase negative staphylococcus. Possible blood culture contaminant (unless isolated from more than one blood culture draw or clinical case suggests pathogenicity). No antibiotic treatment is indicated for blood  culture contaminants. CRITICAL RESULT CALLED TO, READ BACK BY AND VERIFIED WITH: [place] ON 07/28/21 BY SS    Staphylococcus lugdunensis NOT DETECTED NOT DETECTED Final   Streptococcus species NOT DETECTED NOT DETECTED Final   Streptococcus agalactiae NOT DETECTED NOT DETECTED Final   Streptococcus pneumoniae NOT DETECTED NOT DETECTED Final   Streptococcus pyogenes NOT DETECTED NOT DETECTED Final   A.calcoaceticus-baumannii NOT DETECTED NOT DETECTED Final   Bacteroides fragilis NOT DETECTED NOT DETECTED Final   Enterobacterales NOT DETECTED NOT DETECTED Final   Enterobacter cloacae complex NOT DETECTED NOT DETECTED Final   Escherichia coli NOT DETECTED NOT DETECTED Final   Klebsiella aerogenes NOT DETECTED NOT DETECTED Final   Klebsiella oxytoca NOT DETECTED NOT DETECTED Final   Klebsiella pneumoniae NOT DETECTED NOT DETECTED Final   Proteus species NOT DETECTED NOT DETECTED Final   Salmonella species NOT DETECTED NOT DETECTED Final   Serratia marcescens NOT DETECTED NOT DETECTED Final   Haemophilus influenzae NOT DETECTED NOT DETECTED Final   Neisseria meningitidis NOT DETECTED NOT DETECTED Final   Pseudomonas aeruginosa NOT DETECTED NOT DETECTED  Final   Stenotrophomonas maltophilia NOT DETECTED NOT DETECTED Final   Candida albicans NOT DETECTED NOT DETECTED Final   Candida auris NOT DETECTED NOT DETECTED Final   Candida glabrata NOT DETECTED NOT DETECTED Final   Candida krusei NOT DETECTED NOT DETECTED Final   Candida parapsilosis NOT DETECTED NOT DETECTED Final   Candida tropicalis NOT DETECTED NOT DETECTED Final   Cryptococcus neoformans/gattii NOT DETECTED NOT DETECTED Final   Methicillin resistance mecA/C DETECTED (A) NOT DETECTED Final    Comment: CRITICAL RESULT CALLED TO, READ BACK BY AND VERIFIED WITH: [place] ON 07/28/21 BY SS Performed at [facility], [address] [place], [place] [address]   CULTURE, BLOOD (ROUTINE X 2) w Reflex to ID Panel     Status: None (Preliminary result)   Collection Time: 07/27/21  4:56 PM   Specimen: BLOOD  Result Value Ref Range Status   Specimen Description  BLOOD BLOOD RIGHT HAND  Final   Special Requests   Final    BOTTLES DRAWN AEROBIC AND ANAEROBIC Blood Culture adequate volume   Culture   Final    NO GROWTH 2 DAYS Performed at [facility], [address] [place], [place] [address]    Report Status PENDING  Incomplete  CULTURE, BLOOD (ROUTINE X 2) w Reflex to ID Panel     Status: None (Preliminary result)   Collection Time: 07/28/21  8:59 PM   Specimen: BLOOD  Result Value Ref Range Status   Specimen Description BLOOD BLOOD RIGHT HAND  Final   Special Requests   Final    BOTTLES DRAWN AEROBIC AND ANAEROBIC Blood Culture results may not be optimal due to an inadequate volume of blood received in culture bottles   Culture   Final    NO GROWTH < 12 HOURS Performed at [facility], [address] [place], [place] [address]    Report Status PENDING  Incomplete    Coagulation Studies: No results for input(s): LABPROT, INR in the last 72 hours.    Urinalysis: No results for input(s): COLORURINE, LABSPEC, PHURINE, GLUCOSEU, HGBUR,  BILIRUBINUR, KETONESUR, PROTEINUR, UROBILINOGEN, NITRITE, LEUKOCYTESUR in the last 72 hours.  Invalid input(s): APPERANCEUR      Imaging: CT ABDOMEN PELVIS WO CONTRAST  Result Date: 07/29/2021 CLINICAL DATA:  Pararenal abscess status post percutaneous drainage with persistent fevers. EXAM: CT ABDOMEN AND PELVIS WITHOUT CONTRAST TECHNIQUE: Multidetector CT imaging of the abdomen and pelvis was performed following the standard protocol without IV contrast. COMPARISON:  07/20/2021, drainage procedure 07/21/2021 FINDINGS: Lower chest: Small left pleural effusion is present with subtotal collapse of the left lower lobe, progressive since prior examination. Mild right basilar atelectasis present. Central venous catheter tip noted within the right atrium. Cardiac size within normal limits. No pericardial effusion. Hepatobiliary: No focal liver abnormality is seen. No gallstones, gallbladder wall thickening, or biliary dilatation. Pancreas: Unremarkable Spleen: Unremarkable Adrenals/Urinary Tract: The adrenal glands are unremarkable. The kidneys are normal in size and position. Nonobstructing caliceal calculi are again identified within the lower pole of the right kidney measuring up to 10 mm and within the left kidney measuring up to 23 mm within the upper pole, unchanged from prior examination. Medullary calcification, possibly post infectious or posttraumatic is again noted within the upper pole of the left kidney. Bilateral ureteral stents are in expected position and previously noted hydronephrosis has resolved on the left and nearly resolved on the right. Foley catheter balloon is seen within a decompressed bladder lumen. Since the prior examination, a percutaneous drainage catheter passes between the ninth and tenth ribs laterally and appears looped within the anterior pararenal space adjacent to the upper pole of the left kidney with complete evacuation of the previously noted pararenal fluid collection in  this location. However, the separate curvilinear fluid collection within the anterior pararenal space measuring 10.1 x 2.2 cm by roughly 9.3 cm in craniocaudal dimension is unchanged. This does not appear to communicate with the percutaneous drainage catheter. Stomach/Bowel: Nasogastric tube extends into the mid to distal body of the stomach. Rectal catheter balloon noted within the a rectal vault. Mild sigmoid diverticulosis. The stomach, small bowel, and large bowel are otherwise unremarkable. Appendix normal. No free intraperitoneal gas or fluid. Vascular/Lymphatic: Mild aortoiliac atherosclerotic calcification. No aortic aneurysm. No pathologic adenopathy within the abdomen and pelvis. Reproductive: Heterogeneous enhancement of the uterine corpus may relate to multiple underlying small uterine fibroids. The pelvic organs are  otherwise unremarkable. Other: Dermal thickening involving the a pannus appears stable since prior examination and may be post inflammatory or postsurgical in nature, but is nonspecific. Tiny fat containing umbilical hernia. Musculoskeletal: No acute bone abnormality. Degenerative changes are seen within the lumbar spine. Advanced degenerative changes are seen within the hips bilaterally, right greater than left. No lytic or blastic bone lesion. IMPRESSION: Interval percutaneous drainage of the a perirenal fluid collection adjacent to the upper pole of the left kidney with complete evacuation of this collection. Note that the catheter extends through the left ninth intercostal space. Interval development of a small dependent left pleural effusion. Persistent unchanged curvilinear fluid collection within the left anterior pararenal space measuring up to 10.1 cm in greatest dimension. This does not appear to communicate with the percutaneous drainage catheter placed within the perirenal collection. Stable bilateral nonobstructing nephrolithiasis. No urolithiasis. Bilateral double-J ureteral  stents are in expected position with interval near complete resolution of hydronephrosis. Interval Foley decompression of the bladder. Aortic Atherosclerosis (ICD10-I70.0). Electronically Signed   By: Fidela Salisbury M.D.   On: 07/29/2021 21:15     Medications:    sodium chloride 5 mL/hr at 07/30/21 1000   amphotericin B  Conventional (FUNGIZONE) IV Stopped (07/30/21 0222)   feeding supplement (GLUCERNA 1.5 CAL) 1,000 mL (07/24/21 1656)   feeding supplement (GLUCERNA 1.5 CAL) 1,000 mL (07/30/21 1208)    sodium chloride   Intravenous Once   sodium chloride   Intravenous Once   acetaminophen (TYLENOL) oral liquid 160 mg/5 mL  650 mg Per Tube Q24H   amLODipine  10 mg Per Tube Daily   chlorhexidine  15 mL Mouth Rinse BID   Chlorhexidine Gluconate Cloth  6 each Topical Q0600   cloNIDine  0.1 mg Per Tube Daily   collagenase   Topical Daily   dextrose  10 mL Intravenous Q24H   dextrose  10 mL Intravenous Q24H   epoetin (EPOGEN/PROCRIT) injection  20,000 Units Subcutaneous Weekly   free water  125 mL Per Tube Q4H   furosemide  80 mg Intravenous Q12H   haloperidol lactate  1 mg Intravenous Once   heparin injection (subcutaneous)  5,000 Units Subcutaneous Q8H   insulin aspart  0-20 Units Subcutaneous Q4H   insulin aspart  6 Units Subcutaneous Q4H   insulin detemir  30 Units Subcutaneous BID   mouth rinse  15 mL Mouth Rinse q12n4p   metoprolol tartrate  100 mg Per Tube BID   multivitamin  1 tablet Per Tube QHS   nutrition supplement (JUVEN)  1 packet Per Tube BID BM   pantoprazole (PROTONIX) IV  40 mg Intravenous Q12H   potassium chloride  40 mEq Per Tube Daily   sodium chloride  500 mL Intravenous Q24H   sodium chloride  500 mL Intravenous Q24H   sodium chloride flush  5 mL Intracatheter Q8H     Assessment/ Plan:  [name] is a 75 y.o. white female with diabetes mellitus type II, hypertension, hyperlipidemia, nephrolithiasis, depression, gout who is admitted to [facility] on 06/27/2021  for Hyperkalemia [E87.5] Hyponatremia [E87.1] ARF (acute renal failure) (HCC) [N17.9] Acute renal failure (ARF) (HCC) [N17.9] Acute respiratory failure with hypoxia (HCC) [J96.01] Demand ischemia of myocardium (HCC) [I24.8] Acute respiratory failure with hypoxemia (HCC) [J96.01] Acute renal failure superimposed on chronic kidney disease, unspecified CKD stage, unspecified acute renal failure type ([place]) [N17.9, N18.9] Sepsis, due to unspecified organism, unspecified whether acute organ dysfunction present [facility]) [A41.9]  Acute Kidney Injury on  chronic kidney disease stage IV with baseline creatinine 1.97 and GFR of 25 on 12/22/19.  Acute kidney injury secondary to sepsis and obstructive uropathy. Chronic kidney disease is likely secondary to diabetic nephropathy.Nonoliguric urine output.    -Patient continues to have good urine output at 3.4 liters over the preceding 24 hours.  Creatinine stable at 2.2 as of yesterday.  Continue to periodically monitor renal parameters.  No indication for dialysis at the moment.  Lab Results  Component Value Date   CREATININE 2.26 (H) 07/29/2021   CREATININE 2.26 (H) 07/28/2021   CREATININE 2.38 (H) 07/27/2021    Intake/Output Summary (Last 24 hours) at 07/30/2021 1212 Last data filed at 07/30/2021 1000 Gross per 24 hour  Intake 2332.9 ml  Output 3200 ml  Net -867.1 ml    2. Acute Respiratory failure secondary to community acquired pneumonia failing outpatient antibiotics. Febrile.  -Continue respiratory support with high flow nasal cannula.  3. Anemia of chronic kidney disease Normocytic Lab Results  Component Value Date   HGB 7.8 (L) 07/28/2021   -Maintain the patient on Epogen 20,000 units subcutaneous weekly.  4.  Diabetes mellitus type II with chronic kidney disease insulin dependent. Most recent hemoglobin A1c is 8.3% on 06/12/21.  Metformin held    5.Bilateral Nephrolithiasis- with obstructive uropathy and left renal abscess Abscess  drained on August 26 Repeat renal ultrasound 9/2-no change in complex fluid collection noted adjacent to the left kidney.  6. Hypokalemia: -Potassium came up to 3.8 post repletion.       LOS: 33 Velina Drollinger 9/4/202212:12 PM

## 2021-07-30 NOTE — Progress Notes (Signed)
Patient Sinus tachycardic, with stable blood pressures.  Patient on Bipap at night and Heated High Flow during the day, tolerated well. Patient's last hemoglobin 7.8, MD made aware. Patient has a Flexiseal with zero output today. Pressure injury on heel, foam applied with heels elevated. Patient refuses pressure reducing boots. Patient given tylenol as needed for discomfort.

## 2021-07-30 NOTE — Progress Notes (Signed)
PROGRESS NOTE    Heather Lopez  ZSW:[id number] DOB: [birth year] DOA: 06/27/2021 PCP: [name], PA-C   Chief complaint.  Shortness of breath. Brief Narrative:   Heather Lopez is a 75 y.o. female with history of hypertension, diabetes mellitus, hyperlipidemia has been feeling weak for the last 3 to 4 days.  Patient had a acute renal failure with creatinine of 9.3, potassium 7.5, acute metabolic acidosis with pH 7.15, procalcitonin 6.83.  Patient was diagnosed was septic shock, acute respiratory failure. On night of 8/4, patient developed acute abdominal pain, CT scan showed acute abdomen secondary to perforated duodenum.  Patient was placed on mechanical ventilation after duodenum repair. Patient had a bilateral hydronephrosis, ureteral stents was placed on 8/10. Patient urine culture was positive for Candida glabrata.  ID consult was obtained, patient currently on high-dose of Diflucan. 8/26. Due to persistent fever, CT scan of abdomen/pelvis was repeated on 8/26, aspiration pararenal fluids was obtained, removed 5 mL of pus.  ID changed antibiotic to Amphotericin in addition to Zosyn  8/31-Hg 6.9. discussed with pt and later with husband about transfusion, risk/complications,  and they would like to proceed. 9/1 Hg 8.2.  Per nursing patient was febrile at 101.4 this a.m. was given Tylenol. 9/2- husband at bedside. No issues reported. Reports she is conversating .  In IR for PEG but unable to get to her today, plan for peg placement on Tuesday morning. 9/3 opens eyes when spoken to.  States she is hanging in there.  Husband at bedside.  Urology was reconsulted by me this AM for the renal abscess  9/4 pt tells me she is bored. Asking where did her husband go. Denies any pain, sob, dizziness.   Assessment & Plan:   Active Problems:   Uncontrolled type 2 diabetes mellitus with insulin therapy ([place])   Hyperlipidemia associated with type 2 diabetes mellitus ([place])   Type 2 diabetes mellitus with  stage 4 chronic kidney disease, with long-term current use of insulin (HCC)   Acute respiratory failure with hypoxia (HCC)   Severe sepsis with septic shock (HCC)   Acute lower UTI   Acute kidney injury superimposed on chronic kidney disease (HCC)   Pneumoperitoneum   Pressure injury of skin   Atrial flutter with rapid ventricular response (HCC)   Endotracheally intubated   Bilateral nephrolithiasis   Anemia of chronic disease   Demand ischemia of myocardium (HCC)   Lethargy   Duodenal ulcer perforation ([place])   Renal abscess   Essential hypertension   Hypokalemia  Acute metabolic encephalopathy. Sepsis with septic shock secondary to obstructive uropathy, kidney stone.  Status post bilateral ureteral stents. Candida urinary tract infection Perirenal abscess. Perforated duodenum. S/p repair. Pararenal drain by IR only removed 5 mL, cultures so far has no growth.  Repeat ultrasound still has significant pararenal abscess. This thought to be secondary to fungal infection with Candida glabrata.  Patient is on Amphotericin per ID. Patient also s/p bilateral ureteral stents.  Last CT scan showed persistent bilateral hydronephrosis, urology reconsulted, believe this is due to urinary retention.  Foley catheter was anchored. As for patient duodenal perforation, patient had surgical repair, condition had 9/1- tolerating TF Per husband MS not as good as yesterday.  9/2 unable to do PEG placement by IR today due to scheduling.  Plan is to do it on Tuesday.  N.p.o. Monday night 9/3IR consulted for repositioning of drainage of left perinephric abscess (abscess drained on August 26) they recommended CT renal without contrast  first Urology input was appreciated. Bilateral hydro and retention.  Status post stenting with no residual hydro and good urine output per Foley 9/4 ct ab/pelv completed found with unchanged fluid collection of Lt pararenal 10.1 cm. IR input pending, will f/u  Fever -on  9/1 Checks x-ray cannot rule out pna Likely from [facility] nephric abscess see above ID following 9/4 single bcx with staph epi...>likely contaminent .  F/u repeat bcx    Acute hypoxemic respiratory failure secondary to septic shock. Morbid obesity and obesity hypoventilation syndrome  Patient has persistent hypoxemia, she had increased oxygen requirement yesterday, she was using 90% of heated high flow.  She was started on IV Lasix, oxygen was down to 40%.  She most likely has acute on chronic diastolic congestive heart failure.  Her echo on 06/27/2021 showed ejection fraction 50 to 55%. 9/4-continue iv lasix    Acute kidney injury on chronic kidney disease stage IV. Hypokalemia. Patient is followed by nephrology, initially dialyzed, she has not been requiring hemodialysis for the last few days. 9/4 will ck renal function in am. Was stablizing at 2/26 No acute indication for hemodialysis        Iron deficiency anemia Likely acute blood loss anemia. Possible GI bleed. Patient has been transfused, also received IV iron.  No B12 deficiency.  She has  black stool from the rectal tube.  GI consult is obtained.  Continued on Protonix IV twice a day. GI is not considering EGD due to high risk of procedure and  patient has severe hypoxemia. 8/31 will transfuse 1 unit prbc today 9/4- cbc stable. Monitor periodically   Uncontrolled type 2 diabetes with hyperglycemia BG stable Continue RISS   Dysphagia. Patient has significant dysphagia, has not been able to take any p.o.  Currently receiving tube feeding through NG tube.  Discussed with GI, family still request a PEG tube placement.  However, due to hypoxemia, the procedure was deemed to be high risk. PEG tube on Tuesday by IR  DVT prophylaxis: Heparin Code Status: DNR Family Communication:none at bedside Disposition Plan:    Status is: Inpatient  Remains inpatient appropriate because:IV treatments appropriate due to intensity  of illness or inability to take PO and Inpatient level of care appropriate due to severity of illness  Dispo: The patient is from: Home              Anticipated d/c is to: LTAC              Patient currently is not medically stable to d/c.   Difficult to place patient No  Patient has been accepted to [facility] in [place].  Getting transfusion.      I/O last 3 completed shifts: In: 3473.8 [I.V.:1161.7; Other:30; NG/GT:1782; IV Piggyback:500.1] Out: 2505 [LZJQB:3419; Drains:15] No intake/output data recorded.     Consultants:  Nephrology, ID  Procedures: Duodenum, bilateral ureteral stents.  Antimicrobials:  Amphotericin Subjective: No cp    Vitals:   07/30/21 1500 07/30/21 1600 07/30/21 1700 07/30/21 1800  BP: (!) 132/100 (!) 129/59 (!) 126/56 (!) 140/56  Pulse: 99 99 (!) 101 (!) 103  Resp: 20 13 20 18   Temp:  98 F (36.7 C)    TempSrc:  Oral    SpO2: 98% 95% 98% 96%  Weight:      Height:        Intake/Output Summary (Last 24 hours) at 07/30/2021 1932 Last data filed at 07/30/2021 1800 Gross per 24 hour  Intake 2463.84 ml  Output 3555  ml  Net -1091.16 ml   Filed Weights   07/27/21 0325 07/29/21 0500 07/30/21 0500  Weight: (!) 151 kg (!) 148 kg (!) 149.5 kg    Examination: Calm, more awake and interactive today Anteriorly cta no wheezing Regular s1/s2 no gallop Soft mild distention +bs +edema Aaxox3.   Data Reviewed: I have personally reviewed following labs and imaging studies  CBC: Recent Labs  Lab 07/24/21 0629 07/25/21 0616 07/26/21 0618 07/27/21 0950 07/28/21 0602 07/30/21 1227  WBC 15.5* 14.9* 14.4* 13.3* 11.2* 9.6  NEUTROABS 12.4* 12.2* 11.9*  --   --   --   HGB 7.2* 8.1* 6.9* 8.2* 7.8* 7.8*  HCT 21.1* 24.6* 21.4* 25.2* 25.0* 24.1*  MCV 86.8 87.5 89.9 90.0 89.3 92.3  PLT 225 252 247 321 355 254*   Basic Metabolic Panel: Recent Labs  Lab 07/25/21 0616 07/26/21 0618 07/27/21 0649 07/28/21 0602 07/29/21 0615 07/30/21 0626  NA 138  143 142 144 146*  --   K 3.4* 3.3* 3.3* 3.5 3.3* 3.8  CL 101 106 105 107 108  --   CO2 22 24 23 28 25   --   GLUCOSE 156* 114* 148* 120* 216*  --   BUN 70* 78* 85* 88* 87*  --   CREATININE 2.88* 2.64* 2.38* 2.26* 2.26*  --   CALCIUM 8.9 9.0 9.1 9.1 9.3  --   MG 2.0 2.1  --  2.0 2.1 2.2  PHOS  --  5.0*  --  4.5 3.9 4.3   GFR: Estimated Creatinine Clearance: 31 mL/min (A) (by C-G formula based on SCr of 2.26 mg/dL (H)). Liver Function Tests: Recent Labs  Lab 07/28/21 0602  AST 19  ALT 31  ALKPHOS 98  BILITOT 0.7  PROT 6.7  ALBUMIN 2.0*   No results for input(s): LIPASE, AMYLASE in the last 168 hours. Recent Labs  Lab 07/28/21 0602  AMMONIA 11   Coagulation Profile: No results for input(s): INR, PROTIME in the last 168 hours. Cardiac Enzymes: No results for input(s): CKTOTAL, CKMB, CKMBINDEX, TROPONINI in the last 168 hours. BNP (last 3 results) No results for input(s): PROBNP in the last 8760 hours. HbA1C: No results for input(s): HGBA1C in the last 72 hours. CBG: Recent Labs  Lab 07/29/21 2337 07/30/21 0340 07/30/21 0746 07/30/21 1126 07/30/21 1558  GLUCAP 191* 184* 187* 171* 184*   Lipid Profile: No results for input(s): CHOL, HDL, LDLCALC, TRIG, CHOLHDL, LDLDIRECT in the last 72 hours. Thyroid Function Tests: No results for input(s): TSH, T4TOTAL, FREET4, T3FREE, THYROIDAB in the last 72 hours. Anemia Panel: No results for input(s): VITAMINB12, FOLATE, FERRITIN, TIBC, IRON, RETICCTPCT in the last 72 hours. Sepsis Labs: No results for input(s): PROCALCITON, LATICACIDVEN in the last 168 hours.  Recent Results (from the past 240 hour(s))  Body fluid culture w Gram Stain     Status: None (Preliminary result)   Collection Time: 07/21/21  1:23 PM   Specimen: Peritoneal Washings; Body Fluid  Result Value Ref Range Status   Specimen Description   Final    PERITONEAL Performed at [facility], [address], [place], [place] [address]    Special  Requests   Final    NONE Performed at [facility], [address] [place], [place] [address]    Gram Stain   Final    MODERATE WBC PRESENT,BOTH PMN AND MONONUCLEAR NO ORGANISMS SEEN    Culture   Final    RARE CANDIDA GLABRATA Sent to [facility] for further  susceptibility testing. Performed at [facility], [place] [address], [place], [place] [address]    Report Status PENDING  Incomplete  Antifungal AST 9 Drug Panel     Status: None   Collection Time: 07/21/21  1:23 PM  Result Value Ref Range Status   Organism ID, Yeast Candida glabrata  Corrected    Comment: (NOTE) Identification performed by account, not confirmed by this laboratory. CORRECTED ON 09/04 AT 1435: PREVIOUSLY REPORTED AS Preliminary report    Amphotericin B MIC 0.5 ug/mL  Final    Comment: (NOTE) Breakpoints have been established for only some organism-drug combinations as indicated. This test was developed and its performance characteristics determined by [facility]. It has not been cleared or approved by the Food and Drug Administration.    Anidulafungin MIC Comment  Final    Comment: (NOTE) 0.03 ug/mL Susceptible Breakpoints have been established for only some organism-drug combinations as indicated. This test was developed and its performance characteristics determined by [facility]. It has not been cleared or approved by the Food and Drug Administration.    Caspofungin MIC Comment  Final    Comment: (NOTE) 0.06 ug/mL Susceptible Breakpoints have been established for only some organism-drug combinations as indicated. This test was developed and its performance characteristics determined by [facility]. It has not been cleared or approved by the Food and Drug Administration.    Micafungin MIC Comment  Final    Comment: (NOTE) 0.016 ug/mL Susceptible Breakpoints have been established for only some organism-drug combinations as indicated. This test was developed and its performance  characteristics determined by [facility]. It has not been cleared or approved by the Food and Drug Administration.    Posaconazole MIC 0.25 ug/mL  Final    Comment: (NOTE) Breakpoints have been established for only some organism-drug combinations as indicated. This test was developed and its performance characteristics determined by [facility]. It has not been cleared or approved by the Food and Drug Administration.    Fluconazole Islt MIC 4.0 ug/mL  Final    Comment: (NOTE) Susceptible Dose Dependent Breakpoints have been established for only some organism-drug combinations as indicated. This test was developed and its performance characteristics determined by [facility]. It has not been cleared or approved by the Food and Drug Administration.    Flucytosine MIC 0.06 ug/mL or less  Final    Comment: (NOTE) Breakpoints have been established for only some organism-drug combinations as indicated. This test was developed and its performance characteristics determined by [facility]. It has not been cleared or approved by the Food and Drug Administration.    Itraconazole MIC 0.25 ug/mL  Final    Comment: (NOTE) Breakpoints have been established for only some organism-drug combinations as indicated. This test was developed and its performance characteristics determined by [facility]. It has not been cleared or approved by the Food and Drug Administration.    Voriconazole MIC 0.06 ug/mL  Final    Comment: (NOTE) Breakpoints have been established for only some organism-drug combinations as indicated. This test was developed and its performance characteristics determined by [facility]. It has not been cleared or approved by the Food and Drug Administration. Performed At: [facility] [place], [place] [id number] Rush Farmer MD HD:[id number]    Source CANDIDA GLABRATA/ PERITONEAL FLUID  Final    Comment: Performed at [facility], [place] [address] [place],  [place] [address]  Aerobic/Anaerobic Culture w Gram Stain (surgical/deep wound)     Status: None   Collection Time: 07/21/21  3:32 PM   Specimen: Abdomen  Result Value Ref Range Status   Specimen Description   Final    ABDOMEN Performed at [facility], [address] [place], [place] [address]    Special Requests   Final    NONE Performed at [facility], [address] [place], [place] [address]    Gram Stain   Final    MODERATE SQUAMOUS EPITHELIAL CELLS PRESENT WBC PRESENT, PREDOMINANTLY PMN NO ORGANISMS SEEN    Culture   Final    RARE CANDIDA GLABRATA NO ANAEROBES ISOLATED Performed at [facility], [place] [address] [place], [place] [address]    Report Status 07/26/2021 FINAL  Final  CULTURE, BLOOD (ROUTINE X 2) w Reflex to ID Panel     Status: Abnormal (Preliminary result)   Collection Time: 07/27/21  4:55 PM   Specimen: BLOOD  Result Value Ref Range Status   Specimen Description   Final    BLOOD BLOOD LEFT HAND Performed at [facility], [address] [place], [place] [address]    Special Requests   Final    BOTTLES DRAWN AEROBIC AND ANAEROBIC Blood Culture results may not be optimal due to an inadequate volume of blood received in culture bottles Performed at [facility], [address] [place], [place] [address]    Culture  Setup Time   Final    Organism ID to follow [place] TO, READ BACK BY AND VERIFIED WITH: [place] Performed at [facility], [address] [place], [place] [address]    Culture (A)  Final    STAPHYLOCOCCUS EPIDERMIDIS THE SIGNIFICANCE OF ISOLATING THIS ORGANISM FROM A SINGLE SET OF BLOOD CULTURES WHEN MULTIPLE SETS ARE DRAWN IS UNCERTAIN. PLEASE NOTIFY THE MICROBIOLOGY DEPARTMENT WITHIN ONE WEEK IF SPECIATION AND SENSITIVITIES ARE REQUIRED. Performed at [facility], [place] [address] [place], [place] [address]     Report Status PENDING  Incomplete  Blood Culture ID Panel (Reflexed)     Status: Abnormal   Collection Time: 07/27/21  4:55 PM  Result Value Ref Range Status   Enterococcus faecalis NOT DETECTED NOT DETECTED Final   Enterococcus Faecium NOT DETECTED NOT DETECTED Final   Listeria monocytogenes NOT DETECTED NOT DETECTED Final   Staphylococcus species DETECTED (A) NOT DETECTED Final    Comment: CRITICAL RESULT CALLED TO, READ BACK BY AND VERIFIED WITH: SUZANE WATSON AT 1940 ON 07/28/21 BY SS    Staphylococcus aureus (BCID) NOT DETECTED NOT DETECTED Final   Staphylococcus epidermidis DETECTED (A) NOT DETECTED Final    Comment: Methicillin (oxacillin) resistant coagulase negative staphylococcus. Possible blood culture contaminant (unless isolated from more than one blood culture draw or clinical case suggests pathogenicity). No antibiotic treatment is indicated for blood  culture contaminants. CRITICAL RESULT CALLED TO, READ BACK BY AND VERIFIED WITH: SUZANE WATSON AT 1940 ON 07/28/21 BY SS    Staphylococcus lugdunensis NOT DETECTED NOT DETECTED Final   Streptococcus species NOT DETECTED NOT DETECTED Final   Streptococcus agalactiae NOT DETECTED NOT DETECTED Final   Streptococcus pneumoniae NOT DETECTED NOT DETECTED Final   Streptococcus pyogenes NOT DETECTED NOT DETECTED Final   A.calcoaceticus-baumannii NOT DETECTED NOT DETECTED Final   Bacteroides fragilis NOT DETECTED NOT DETECTED Final   Enterobacterales NOT DETECTED NOT DETECTED Final   Enterobacter cloacae complex NOT DETECTED NOT DETECTED Final   Escherichia coli NOT DETECTED NOT DETECTED Final  Klebsiella aerogenes NOT DETECTED NOT DETECTED Final   Klebsiella oxytoca NOT DETECTED NOT DETECTED Final   Klebsiella pneumoniae NOT DETECTED NOT DETECTED Final   Proteus species NOT DETECTED NOT DETECTED Final   Salmonella species NOT DETECTED NOT DETECTED Final   Serratia marcescens NOT DETECTED NOT DETECTED Final   Haemophilus  influenzae NOT DETECTED NOT DETECTED Final   Neisseria meningitidis NOT DETECTED NOT DETECTED Final   Pseudomonas aeruginosa NOT DETECTED NOT DETECTED Final   Stenotrophomonas maltophilia NOT DETECTED NOT DETECTED Final   Candida albicans NOT DETECTED NOT DETECTED Final   Candida auris NOT DETECTED NOT DETECTED Final   Candida glabrata NOT DETECTED NOT DETECTED Final   Candida krusei NOT DETECTED NOT DETECTED Final   Candida parapsilosis NOT DETECTED NOT DETECTED Final   Candida tropicalis NOT DETECTED NOT DETECTED Final   Cryptococcus neoformans/gattii NOT DETECTED NOT DETECTED Final   Methicillin resistance mecA/C DETECTED (A) NOT DETECTED Final    Comment: CRITICAL RESULT CALLED TO, READ BACK BY AND VERIFIED WITH: SUZANE WATSON AT 1940 ON 07/28/21 BY SS Performed at [facility], [address] [place], [place] [address]   CULTURE, BLOOD (ROUTINE X 2) w Reflex to ID Panel     Status: None (Preliminary result)   Collection Time: 07/27/21  4:56 PM   Specimen: BLOOD  Result Value Ref Range Status   Specimen Description BLOOD BLOOD RIGHT HAND  Final   Special Requests   Final    BOTTLES DRAWN AEROBIC AND ANAEROBIC Blood Culture adequate volume   Culture   Final    NO GROWTH 2 DAYS Performed at [facility], [address] [place], [place] [address]    Report Status PENDING  Incomplete  CULTURE, BLOOD (ROUTINE X 2) w Reflex to ID Panel     Status: None (Preliminary result)   Collection Time: 07/28/21  8:59 PM   Specimen: BLOOD  Result Value Ref Range Status   Specimen Description BLOOD BLOOD RIGHT HAND  Final   Special Requests   Final    BOTTLES DRAWN AEROBIC AND ANAEROBIC Blood Culture results may not be optimal due to an inadequate volume of blood received in culture bottles   Culture   Final    NO GROWTH < 12 HOURS Performed at [facility], [address], [place],  [address]    Report Status PENDING  Incomplete         Radiology  Studies: CT ABDOMEN PELVIS WO CONTRAST  Result Date: 07/29/2021 CLINICAL DATA:  Pararenal abscess status post percutaneous drainage with persistent fevers. EXAM: CT ABDOMEN AND PELVIS WITHOUT CONTRAST TECHNIQUE: Multidetector CT imaging of the abdomen and pelvis was performed following the standard protocol without IV contrast. COMPARISON:  07/20/2021, drainage procedure 07/21/2021 FINDINGS: Lower chest: Small left pleural effusion is present with subtotal collapse of the left lower lobe, progressive since prior examination. Mild right basilar atelectasis present. Central venous catheter tip noted within the right atrium. Cardiac size within normal limits. No pericardial effusion. Hepatobiliary: No focal liver abnormality is seen. No gallstones, gallbladder wall thickening, or biliary dilatation. Pancreas: Unremarkable Spleen: Unremarkable Adrenals/Urinary Tract: The adrenal glands are unremarkable. The kidneys are normal in size and position. Nonobstructing caliceal calculi are again identified within the lower pole of the right kidney measuring up to 10 mm and within the left kidney measuring up to 23 mm within the upper pole, unchanged from prior examination. Medullary calcification, possibly post infectious or posttraumatic is again noted within the upper  pole of the left kidney. Bilateral ureteral stents are in expected position and previously noted hydronephrosis has resolved on the left and nearly resolved on the right. Foley catheter balloon is seen within a decompressed bladder lumen. Since the prior examination, a percutaneous drainage catheter passes between the ninth and tenth ribs laterally and appears looped within the anterior pararenal space adjacent to the upper pole of the left kidney with complete evacuation of the previously noted pararenal fluid collection in this location. However, the separate curvilinear fluid collection within the anterior pararenal space measuring 10.1 x 2.2 cm by roughly  9.3 cm in craniocaudal dimension is unchanged. This does not appear to communicate with the percutaneous drainage catheter. Stomach/Bowel: Nasogastric tube extends into the mid to distal body of the stomach. Rectal catheter balloon noted within the a rectal vault. Mild sigmoid diverticulosis. The stomach, small bowel, and large bowel are otherwise unremarkable. Appendix normal. No free intraperitoneal gas or fluid. Vascular/Lymphatic: Mild aortoiliac atherosclerotic calcification. No aortic aneurysm. No pathologic adenopathy within the abdomen and pelvis. Reproductive: Heterogeneous enhancement of the uterine corpus may relate to multiple underlying small uterine fibroids. The pelvic organs are otherwise unremarkable. Other: Dermal thickening involving the a pannus appears stable since prior examination and may be post inflammatory or postsurgical in nature, but is nonspecific. Tiny fat containing umbilical hernia. Musculoskeletal: No acute bone abnormality. Degenerative changes are seen within the lumbar spine. Advanced degenerative changes are seen within the hips bilaterally, right greater than left. No lytic or blastic bone lesion. IMPRESSION: Interval percutaneous drainage of the a perirenal fluid collection adjacent to the upper pole of the left kidney with complete evacuation of this collection. Note that the catheter extends through the left ninth intercostal space. Interval development of a small dependent left pleural effusion. Persistent unchanged curvilinear fluid collection within the left anterior pararenal space measuring up to 10.1 cm in greatest dimension. This does not appear to communicate with the percutaneous drainage catheter placed within the perirenal collection. Stable bilateral nonobstructing nephrolithiasis. No urolithiasis. Bilateral double-J ureteral stents are in expected position with interval near complete resolution of hydronephrosis. Interval Foley decompression of the bladder.  Aortic Atherosclerosis (ICD10-I70.0). Electronically Signed   By: Fidela Salisbury M.D.   On: 07/29/2021 21:15        Scheduled Meds:  sodium chloride   Intravenous Once   sodium chloride   Intravenous Once   acetaminophen (TYLENOL) oral liquid 160 mg/5 mL  650 mg Per Tube Q24H   amLODipine  10 mg Per Tube Daily   chlorhexidine  15 mL Mouth Rinse BID   Chlorhexidine Gluconate Cloth  6 each Topical Q0600   cloNIDine  0.1 mg Per Tube Daily   collagenase   Topical Daily   dextrose  10 mL Intravenous Q24H   dextrose  10 mL Intravenous Q24H   epoetin (EPOGEN/PROCRIT) injection  20,000 Units Subcutaneous Weekly   free water  125 mL Per Tube Q4H   furosemide  80 mg Intravenous Q12H   haloperidol lactate  1 mg Intravenous Once   heparin injection (subcutaneous)  5,000 Units Subcutaneous Q8H   insulin aspart  0-20 Units Subcutaneous Q4H   insulin aspart  6 Units Subcutaneous Q4H   insulin detemir  30 Units Subcutaneous BID   mouth rinse  15 mL Mouth Rinse q12n4p   metoprolol tartrate  100 mg Per Tube BID   multivitamin  1 tablet Per Tube QHS   nutrition supplement (JUVEN)  1 packet Per Tube BID BM  pantoprazole (PROTONIX) IV  40 mg Intravenous Q12H   potassium chloride  40 mEq Per Tube Daily   sodium chloride  500 mL Intravenous Q24H   sodium chloride  500 mL Intravenous Q24H   sodium chloride flush  5 mL Intracatheter Q8H   Continuous Infusions:  sodium chloride 5 mL/hr at 07/30/21 1800   amphotericin B  Conventional (FUNGIZONE) IV Stopped (07/30/21 0222)   feeding supplement (GLUCERNA 1.5 CAL) 1,000 mL (07/24/21 1656)   feeding supplement (GLUCERNA 1.5 CAL) 1,000 mL (07/30/21 1208)     LOS: 33 days    Time spent: 35 minutes with >50% on coc    Nolberto Hanlon, MD [facility]   To contact the attending provider between 7A-7P or the covering provider during after hours 7P-7A, please log into the web site [email or web address] and access using universal [place] password for  that web site. If you do not have the password, please call the hospital operator.  07/30/2021, 7:32 PM

## 2021-07-30 NOTE — Consult Note (Signed)
PHARMACY CONSULT NOTE - FOLLOW UP  Pharmacy Consult for Electrolyte Monitoring and Replacement   Recent Labs: Potassium (mmol/L)  Date Value  07/30/2021 3.8  01/29/2014 4.0   Magnesium (mg/dL)  Date Value  07/30/2021 2.2  01/29/2014 1.8   Calcium (mg/dL)  Date Value  07/29/2021 9.3   Calcium, Total (mg/dL)  Date Value  01/29/2014 9.7   Albumin (g/dL)  Date Value  07/28/2021 2.0 (L)  07/19/2015 4.5  01/27/2014 4.1   Phosphorus (mg/dL)  Date Value  07/30/2021 4.3   Sodium (mmol/L)  Date Value  07/29/2021 146 (H)  07/19/2015 142  01/29/2014 133 (L)     Assessment: 75yo female w/ h/o HTN, DM, HLD, & MDD presenting with weakness/AKI/sepsis with growth of Candida glabrata pyelonephritis from 8/17 Ucx. Nephrology consulted. Pharmacy consulted to manage PRN mgmt of electrolytes  Goal of Therapy:  Lytes WNL  Plan:   No replacement needed at this time.  Will f/u with BMP in the AM.   Oswald Hillock ,PharmD Clinical Pharmacist 07/30/2021 10:17 AM

## 2021-07-31 DIAGNOSIS — N151 Renal and perinephric abscess: Secondary | ICD-10-CM

## 2021-07-31 DIAGNOSIS — N201 Calculus of ureter: Secondary | ICD-10-CM

## 2021-07-31 DIAGNOSIS — R338 Other retention of urine: Secondary | ICD-10-CM

## 2021-07-31 DIAGNOSIS — N1330 Unspecified hydronephrosis: Secondary | ICD-10-CM

## 2021-07-31 LAB — RENAL FUNCTION PANEL
Albumin: 2 g/dL — ABNORMAL LOW (ref 3.5–5.0)
Anion gap: 11 (ref 5–15)
BUN: 97 mg/dL — ABNORMAL HIGH (ref 8–23)
CO2: 26 mmol/L (ref 22–32)
Calcium: 9.6 mg/dL (ref 8.9–10.3)
Chloride: 108 mmol/L (ref 98–111)
Creatinine, Ser: 2.01 mg/dL — ABNORMAL HIGH (ref 0.44–1.00)
GFR, Estimated: 26 mL/min — ABNORMAL LOW (ref >60)
Glucose, Bld: 136 mg/dL — ABNORMAL HIGH (ref 70–99)
Phosphorus: 3.6 mg/dL (ref 2.5–4.6)
Potassium: 3.9 mmol/L (ref 3.5–5.1)
Sodium: 145 mmol/L (ref 135–145)

## 2021-07-31 LAB — BODY FLUID CULTURE W GRAM STAIN

## 2021-07-31 LAB — MAGNESIUM: Magnesium: 2.2 mg/dL (ref 1.7–2.4)

## 2021-07-31 LAB — GLUCOSE, CAPILLARY
Glucose-Capillary: 143 mg/dL — ABNORMAL HIGH (ref 70–99)
Glucose-Capillary: 126 mg/dL — ABNORMAL HIGH (ref 70–99)
Glucose-Capillary: 123 mg/dL — ABNORMAL HIGH (ref 70–99)
Glucose-Capillary: 109 mg/dL — ABNORMAL HIGH (ref 70–99)
Glucose-Capillary: 126 mg/dL — ABNORMAL HIGH (ref 70–99)
Glucose-Capillary: 170 mg/dL — ABNORMAL HIGH (ref 70–99)

## 2021-07-31 LAB — CULTURE, BLOOD (ROUTINE X 2)

## 2021-07-31 LAB — PHOSPHORUS: Phosphorus: 3.7 mg/dL (ref 2.5–4.6)

## 2021-07-31 NOTE — Progress Notes (Signed)
[facility]  ROUNDING NOTE   Subjective:   Heather Lopez is a 75 y.o. white female who was admitted for community acquired pneumonia on 06/27/2021     Hospital course complicated by duodenal perforation s/p repair on 06/30/2021 Left obstructive hydronephrosis and renal abscess, Patient underwent cystoscopy and b/l ureteral stent placement 8/10 8/26- CT guided aspiration of perirenal abscess- 5 ml purulent fluid aspirated   Lab Results  Component Value Date   CREATININE 2.26 (H) 07/29/2021   CREATININE 2.26 (H) 07/28/2021   CREATININE 2.38 (H) 07/27/2021   09/04 0701 - 09/05 0700 In: 2428.2 [I.V.:65.9; NG/GT:1312; IV Piggyback:1000.3] Out: 3149 [Urine:3755; Stool:75]   Pt continues to have good UOP. UOP 3.7 liters over the past 24 hours.  No new renal function testing.    Objective:  Vital signs in last 24 hours:  Temp:  [98 F (36.7 C)-99.6 F (37.6 C)] 99.2 F (37.3 C) (09/05 0800) Pulse Rate:  [82-105] 93 (09/05 1200) Resp:  [13-29] 19 (09/05 1200) BP: (123-163)/(50-128) 134/98 (09/05 1200) SpO2:  [88 %-100 %] 100 % (09/05 1200) FiO2 (%):  [60 %-63 %] 60 % (09/05 1000)  Weight change:  Filed Weights   07/27/21 0325 07/29/21 0500 07/30/21 0500  Weight: (!) 151 kg (!) 148 kg (!) 149.5 kg    Intake/Output: I/O last 3 completed shifts: In: 3976.1 [I.V.:1113.7; Other:50; NG/GT:1312; IV Piggyback:1500.4] Out: 5430 [Urine:5355; Stool:75]   Intake/Output this shift:  Total I/O In: 2643.5 [I.V.:883.5; NG/GT:1260; IV Piggyback:500] Out: 1300 [Urine:1300]  Physical Exam: General: No acute distress  Head: HFNC O2, NGT in place  Eyes: Anicteric  Lungs:  +coarse breath sounds  Heart: S1S2 on rubs  Abdomen:  midline incision  Extremities: + peripheral edema   Neurologic: Awake, alert, follows commands  GU Foley with yellow urine  Access:  LIJ permcath 8/18  Rectal tube  Basic Metabolic Panel: Recent Labs  Lab 07/25/21 0616 07/26/21 0618 07/27/21 0649  07/28/21 0602 07/29/21 0615 07/30/21 0626 07/31/21 0459  Heather 138 143 142 144 146*  --   --   K 3.4* 3.3* 3.3* 3.5 3.3* 3.8  --   CL 101 106 105 107 108  --   --   CO2 22 24 23 28 25   --   --   GLUCOSE 156* 114* 148* 120* 216*  --   --   BUN 70* 78* 85* 88* 87*  --   --   CREATININE 2.88* 2.64* 2.38* 2.26* 2.26*  --   --   CALCIUM 8.9 9.0 9.1 9.1 9.3  --   --   MG 2.0 2.1  --  2.0 2.1 2.2 2.2  PHOS  --  5.0*  --  4.5 3.9 4.3 3.7     Liver Function Tests: Recent Labs  Lab 07/28/21 0602  AST 19  ALT 31  ALKPHOS 98  BILITOT 0.7  PROT 6.7  ALBUMIN 2.0*    No results for input(s): LIPASE, AMYLASE in the last 168 hours. Recent Labs  Lab 07/28/21 0602  AMMONIA 11     CBC: Recent Labs  Lab 07/25/21 0616 07/26/21 0618 07/27/21 0950 07/28/21 0602 07/30/21 1227  WBC 14.9* 14.4* 13.3* 11.2* 9.6  NEUTROABS 12.2* 11.9*  --   --   --   HGB 8.1* 6.9* 8.2* 7.8* 7.8*  HCT 24.6* 21.4* 25.2* 25.0* 24.1*  MCV 87.5 89.9 90.0 89.3 92.3  PLT 252 247 321 355 402*     Cardiac Enzymes: No results for  input(s): CKTOTAL, CKMB, CKMBINDEX, TROPONINI in the last 168 hours.  BNP: Invalid input(s): POCBNP  CBG: Recent Labs  Lab 07/30/21 1942 07/30/21 2357 07/31/21 0334 07/31/21 0812 07/31/21 1141  GLUCAP 181* 168* 143* 126* 123*     Microbiology: Results for orders placed or performed during the hospital encounter of 06/27/21  Resp Panel by RT-PCR (Flu A&B, Covid) Nasopharyngeal Swab     Status: None   Collection Time: 06/27/21 12:32 AM   Specimen: Nasopharyngeal Swab; Nasopharyngeal(NP) swabs in vial transport medium  Result Value Ref Range Status   SARS Coronavirus 2 by RT PCR NEGATIVE NEGATIVE Final    Comment: (NOTE) SARS-CoV-2 target nucleic acids are NOT DETECTED.  The SARS-CoV-2 RNA is generally detectable in upper respiratory specimens during the acute phase of infection. The lowest concentration of SARS-CoV-2 viral copies this assay can detect is 138  copies/mL. A negative result does not preclude SARS-Cov-2 infection and should not be used as the sole basis for treatment or other patient management decisions. A negative result may occur with  improper specimen collection/handling, submission of specimen other than nasopharyngeal swab, presence of viral mutation(s) within the areas targeted by this assay, and inadequate number of viral copies(<138 copies/mL). A negative result must be combined with clinical observations, patient history, and epidemiological information. The expected result is Negative.  Fact Sheet for Patients:  EntrepreneurPulse.com.au  Fact Sheet for Healthcare Providers:  IncredibleEmployment.be  This test is no t yet approved or cleared by the [place] FDA and  has been authorized for detection and/or diagnosis of SARS-CoV-2 by FDA under an Emergency Use Authorization (EUA). This EUA will remain  in effect (meaning this test can be used) for the duration of the COVID-19 declaration under Section 564(b)(1) of the Act, 21 U.S.C.section 360bbb-3(b)(1), unless the authorization is terminated  or revoked sooner.       Influenza A by PCR NEGATIVE NEGATIVE Final   Influenza B by PCR NEGATIVE NEGATIVE Final    Comment: (NOTE) The Xpert Xpress SARS-CoV-2/FLU/RSV plus assay is intended as an aid in the diagnosis of influenza from Nasopharyngeal swab specimens and should not be used as a sole basis for treatment. Nasal washings and aspirates are unacceptable for Xpert Xpress SARS-CoV-2/FLU/RSV testing.  Fact Sheet for Patients: EntrepreneurPulse.com.au  Fact Sheet for Healthcare Providers: IncredibleEmployment.be  This test is not yet approved or cleared by the [place] FDA and has been authorized for detection and/or diagnosis of SARS-CoV-2 by FDA under an Emergency Use Authorization (EUA). This EUA will remain in effect (meaning  this test can be used) for the duration of the COVID-19 declaration under Section 564(b)(1) of the Act, 21 U.S.C. section 360bbb-3(b)(1), unless the authorization is terminated or revoked.  Performed at [facility], [address] [place], [place] [address]   Blood culture (routine x 2)     Status: None   Collection Time: 06/27/21 12:43 AM   Specimen: BLOOD  Result Value Ref Range Status   Specimen Description BLOOD RIGHT ASSIST CONTROL  Final   Special Requests   Final    BOTTLES DRAWN AEROBIC AND ANAEROBIC Blood Culture results may not be optimal due to an inadequate volume of blood received in culture bottles   Culture   Final    NO GROWTH 5 DAYS Performed at [facility], [address] [place], [place] [address]    Report Status 07/02/2021 FINAL  Final  Blood culture (routine x 2)     Status: None  Collection Time: 06/27/21  1:45 AM   Specimen: BLOOD  Result Value Ref Range Status   Specimen Description BLOOD RIGHT ASSIST CONTROL  Final   Special Requests   Final    BOTTLES DRAWN AEROBIC AND ANAEROBIC Blood Culture adequate volume   Culture   Final    NO GROWTH 5 DAYS Performed at [facility], [address], [place], [place] [address]    Report Status 07/02/2021 FINAL  Final  Urine Culture     Status: Abnormal   Collection Time: 06/27/21  2:39 AM   Specimen: In/Out Cath Urine  Result Value Ref Range Status   Specimen Description   Final    IN/OUT CATH URINE Performed at [facility], [address] [place], [place] [address]    Special Requests   Final    NONE Performed at [facility], [address] [place], [place] [address]    Culture 30,000 COLONIES/mL YEAST (A)  Final   Report Status 06/28/2021 FINAL  Final  MRSA Next Gen by PCR, Nasal     Status: None   Collection Time: 06/30/21  5:01 AM   Specimen: Nasal Mucosa; Nasal Swab  Result Value Ref Range Status   MRSA by PCR Next Gen NOT DETECTED NOT  DETECTED Final    Comment: (NOTE) The GeneXpert MRSA Assay (FDA approved for NASAL specimens only), is one component of a comprehensive MRSA colonization surveillance program. It is not intended to diagnose MRSA infection nor to guide or monitor treatment for MRSA infections. Test performance is not FDA approved in patients less than 14 years old. Performed at [facility], [address] [place], [place] [address]   Urine Culture     Status: Abnormal   Collection Time: 07/05/21 10:36 AM   Specimen: PATH Other; Urine  Result Value Ref Range Status   Specimen Description   Final    URINE, RANDOM LEFT RENAL PELVIS URINE Performed at [facility], [address], [place], [place] [address]    Special Requests   Final    NONE Performed at [facility], [address] [place], [place] [address]    Culture 70,000 [place] (A)  Final   Report Status 07/12/2021 FINAL  Final  Urine Culture     Status: Abnormal   Collection Time: 07/05/21 10:48 AM   Specimen: PATH Other; Urine  Result Value Ref Range Status   Specimen Description   Final    URINE, RANDOM RIGHT KIDNEY URINE Performed at [facility], [address], [place], [place] [address]    Special Requests   Final    NONE Performed at [facility], [address], [place], [place] [address]    Culture (A)  Final    >=100,000 COLONIES/mL CANDIDA GLABRATA SEE SEPARATE REPORT Performed at [facility], [place] [address] [place],  [address]    Report Status 07/17/2021 FINAL  Final  Antifungal AST 9 Drug Panel     Status: None   Collection Time: 07/05/21 10:48 AM  Result Value Ref Range Status   Organism ID, Yeast Candida glabrata  Corrected    Comment: (NOTE) Identification performed by account, not confirmed by this laboratory. CORRECTED ON 08/21 AT 1535: PREVIOUSLY REPORTED AS Preliminary report    Amphotericin B MIC 0.5 ug/mL  Final     Comment: (NOTE) Breakpoints have been established for only some organism-drug combinations as indicated. This test was developed and its performance characteristics  determined by [facility]. It has not been cleared or approved by the Food and Drug Administration.    Anidulafungin MIC Comment  Final    Comment: (NOTE) 0.03 ug/mL Susceptible Breakpoints have been established for only some organism-drug combinations as indicated. This test was developed and its performance characteristics determined by [facility]. It has not been cleared or approved by the Food and Drug Administration.    Caspofungin MIC Comment  Final    Comment: (NOTE) 0.06 ug/mL Susceptible Breakpoints have been established for only some organism-drug combinations as indicated. This test was developed and its performance characteristics determined by [facility]. It has not been cleared or approved by the Food and Drug Administration.    Micafungin MIC Comment  Final    Comment: (NOTE) 0.016 ug/mL Susceptible Breakpoints have been established for only some organism-drug combinations as indicated. This test was developed and its performance characteristics determined by [facility]. It has not been cleared or approved by the Food and Drug Administration.    Posaconazole MIC 0.25 ug/mL  Final    Comment: (NOTE) Breakpoints have been established for only some organism-drug combinations as indicated. This test was developed and its performance characteristics determined by [facility]. It has not been cleared or approved by the Food and Drug Administration.    Fluconazole Islt MIC 2.0 ug/mL  Final    Comment: (NOTE) Susceptible Dose Dependent Breakpoints have been established for only some organism-drug combinations as indicated. This test was developed and its performance characteristics determined by [facility]. It has not been cleared or approved by the Food and Drug Administration.    Flucytosine MIC 0.06 ug/mL or less   Final    Comment: (NOTE) Breakpoints have been established for only some organism-drug combinations as indicated. This test was developed and its performance characteristics determined by [facility]. It has not been cleared or approved by the Food and Drug Administration.    Itraconazole MIC 0.12 ug/mL  Final    Comment: (NOTE) Breakpoints have been established for only some organism-drug combinations as indicated. This test was developed and its performance characteristics determined by [facility]. It has not been cleared or approved by the Food and Drug Administration.    Voriconazole MIC 0.06 ug/mL  Final    Comment: (NOTE) Breakpoints have been established for only some organism-drug combinations as indicated. This test was developed and its performance characteristics determined by [facility]. It has not been cleared or approved by the Food and Drug Administration. Performed At: [facility] [address] [place], [place] [id number] Rush Farmer MD [id number]    Source CANDIDA GLABRATA SUSCEPTIBILITY URINE CULTURE  Final    Comment: Performed at [facility], [place] [address], [place], [place] [address]  CULTURE, BLOOD (ROUTINE X 2) w Reflex to ID Panel     Status: None   Collection Time: 07/15/21  7:57 PM   Specimen: BLOOD  Result Value Ref Range Status   Specimen Description BLOOD LEFT ASSIST CONTROL  Final   Special Requests   Final    BOTTLES DRAWN AEROBIC AND ANAEROBIC Blood Culture adequate volume   Culture   Final    NO GROWTH 5 DAYS Performed at [facility], [address] [place], [place] [address]    Report Status 07/20/2021 FINAL  Final  CULTURE, BLOOD (ROUTINE X 2) w Reflex to ID Panel     Status: None   Collection Time: 07/15/21  8:07 PM   Specimen: BLOOD  Result Value Ref Range Status   Specimen  Description BLOOD LEFT FOREARM  Final   Special Requests   Final    BOTTLES DRAWN AEROBIC AND ANAEROBIC Blood Culture adequate volume    Culture   Final    NO GROWTH 5 DAYS Performed at [facility], [address] [place], [place] [address]    Report Status 07/20/2021 FINAL  Final  Body fluid culture w Gram Stain     Status: None (Preliminary result)   Collection Time: 07/21/21  1:23 PM   Specimen: Peritoneal Washings; Body Fluid  Result Value Ref Range Status   Specimen Description   Final    PERITONEAL Performed at [facility], [address] [place], [place] [address]    Special Requests   Final    NONE Performed at [facility], [address] [place], [place] [address]    Gram Stain   Final    MODERATE WBC PRESENT,BOTH PMN AND MONONUCLEAR NO ORGANISMS SEEN    Culture   Final    RARE CANDIDA GLABRATA Sent to [place] for further susceptibility testing. Performed at [facility], [place] [address] [place],  [address]    Report Status PENDING  Incomplete  Antifungal AST 9 Drug Panel     Status: None   Collection Time: 07/21/21  1:23 PM  Result Value Ref Range Status   Organism ID, Yeast Candida glabrata  Corrected    Comment: (NOTE) Identification performed by account, not confirmed by this laboratory. CORRECTED ON 09/04 AT 1435: PREVIOUSLY REPORTED AS Preliminary report    Amphotericin B MIC 0.5 ug/mL  Final    Comment: (NOTE) Breakpoints have been established for only some organism-drug combinations as indicated. This test was developed and its performance characteristics determined by [facility]. It has not been cleared or approved by the Food and Drug Administration.    Anidulafungin MIC Comment  Final    Comment: (NOTE) 0.03 ug/mL Susceptible Breakpoints have been established for only some organism-drug combinations as indicated. This test was developed and its performance characteristics determined by [facility]. It has not been cleared or approved by the Food and Drug Administration.    Caspofungin MIC Comment  Final    Comment: (NOTE) 0.06 ug/mL  Susceptible Breakpoints have been established for only some organism-drug combinations as indicated. This test was developed and its performance characteristics determined by [facility]. It has not been cleared or approved by the Food and Drug Administration.    Micafungin MIC Comment  Final    Comment: (NOTE) 0.016 ug/mL Susceptible Breakpoints have been established for only some organism-drug combinations as indicated. This test was developed and its performance characteristics determined by [facility]. It has not been cleared or approved by the Food and Drug Administration.    Posaconazole MIC 0.25 ug/mL  Final    Comment: (NOTE) Breakpoints have been established for only some organism-drug combinations as indicated. This test was developed and its performance characteristics determined by [facility]. It has not been cleared or approved by the Food and Drug Administration.    Fluconazole Islt MIC 4.0 ug/mL  Final    Comment: (NOTE) Susceptible Dose Dependent Breakpoints have been established for only some organism-drug combinations as indicated. This test was developed and its performance characteristics determined by [facility]. It has not been cleared or approved by the Food and Drug Administration.    Flucytosine MIC 0.06 ug/mL or less  Final    Comment: (NOTE) Breakpoints have been established for only some organism-drug combinations as indicated. This test was developed  and its performance characteristics determined by [facility]. It has not been cleared or approved by the Food and Drug Administration.    Itraconazole MIC 0.25 ug/mL  Final    Comment: (NOTE) Breakpoints have been established for only some organism-drug combinations as indicated. This test was developed and its performance characteristics determined by [facility]. It has not been cleared or approved by the Food and Drug Administration.    Voriconazole MIC 0.06 ug/mL  Final    Comment: (NOTE) Breakpoints have  been established for only some organism-drug combinations as indicated. This test was developed and its performance characteristics determined by [facility]. It has not been cleared or approved by the Food and Drug Administration. Performed At: [facility] [place], [place] [id number] Rush Farmer MD [id number]    Source CANDIDA GLABRATA/ PERITONEAL FLUID  Final    Comment: Performed at [facility], [place] [address], [place], [place] [address]  Aerobic/Anaerobic Culture w Gram Stain (surgical/deep wound)     Status: None   Collection Time: 07/21/21  3:32 PM   Specimen: Abdomen  Result Value Ref Range Status   Specimen Description   Final    ABDOMEN Performed at [facility], [address], [place], [place] [address]    Special Requests   Final    NONE Performed at [facility], [address] [place], [place] [address]    Gram Stain   Final    MODERATE SQUAMOUS EPITHELIAL CELLS PRESENT WBC PRESENT, PREDOMINANTLY PMN NO ORGANISMS SEEN    Culture   Final    RARE CANDIDA GLABRATA NO ANAEROBES ISOLATED Performed at [facility], [place] [address], [place], [place] [address]    Report Status 07/26/2021 FINAL  Final  CULTURE, BLOOD (ROUTINE X 2) w Reflex to ID Panel     Status: Abnormal   Collection Time: 07/27/21  4:55 PM   Specimen: BLOOD  Result Value Ref Range Status   Specimen Description   Final    BLOOD BLOOD LEFT HAND Performed at [facility], [address] [place], [place] [address]    Special Requests   Final    BOTTLES DRAWN AEROBIC AND ANAEROBIC Blood Culture results may not be optimal due to an inadequate volume of blood received in culture bottles Performed at [facility], [address], [place], [place] [address]    Culture  Setup Time   Final    Organism ID to follow [place] CRITICAL RESULT CALLED TO, READ BACK BY AND VERIFIED WITH: [place] Performed at [facility], [address] [place], [place] [address]    Culture (A)  Final    STAPHYLOCOCCUS EPIDERMIDIS THE SIGNIFICANCE OF ISOLATING THIS ORGANISM FROM A SINGLE SET OF BLOOD CULTURES WHEN MULTIPLE SETS ARE DRAWN IS UNCERTAIN. PLEASE NOTIFY THE MICROBIOLOGY DEPARTMENT WITHIN ONE WEEK IF SPECIATION AND SENSITIVITIES ARE REQUIRED. Performed at [facility], [place] [address], [place], [place] [address]    Report Status 07/31/2021 FINAL  Final  Blood Culture ID Panel (Reflexed)     Status: Abnormal   Collection Time: 07/27/21  4:55 PM  Result Value Ref Range Status   Enterococcus faecalis NOT DETECTED NOT DETECTED Final   Enterococcus Faecium NOT DETECTED NOT DETECTED Final   Listeria monocytogenes NOT DETECTED NOT DETECTED Final   Staphylococcus species DETECTED (A) NOT DETECTED Final    Comment: CRITICAL RESULT CALLED TO, READ BACK  BY AND VERIFIED WITH: [place] ON 07/28/21 BY SS    Staphylococcus aureus (BCID) NOT DETECTED NOT DETECTED Final   Staphylococcus epidermidis DETECTED (A) NOT DETECTED Final    Comment: Methicillin (oxacillin) resistant coagulase negative staphylococcus. Possible blood culture contaminant (unless isolated from more than one blood culture draw or clinical case suggests pathogenicity). No antibiotic treatment is indicated for blood  culture contaminants. CRITICAL RESULT CALLED TO, READ BACK BY AND VERIFIED WITH: [place] ON 07/28/21 BY SS    Staphylococcus lugdunensis NOT DETECTED NOT DETECTED Final   Streptococcus species NOT DETECTED NOT DETECTED Final   Streptococcus agalactiae NOT DETECTED NOT DETECTED Final   Streptococcus pneumoniae NOT DETECTED NOT DETECTED Final   Streptococcus pyogenes NOT DETECTED NOT DETECTED Final   A.calcoaceticus-baumannii NOT DETECTED NOT DETECTED Final   Bacteroides fragilis NOT DETECTED NOT DETECTED Final   Enterobacterales NOT DETECTED NOT DETECTED Final    Enterobacter cloacae complex NOT DETECTED NOT DETECTED Final   Escherichia coli NOT DETECTED NOT DETECTED Final   Klebsiella aerogenes NOT DETECTED NOT DETECTED Final   Klebsiella oxytoca NOT DETECTED NOT DETECTED Final   Klebsiella pneumoniae NOT DETECTED NOT DETECTED Final   Proteus species NOT DETECTED NOT DETECTED Final   Salmonella species NOT DETECTED NOT DETECTED Final   Serratia marcescens NOT DETECTED NOT DETECTED Final   Haemophilus influenzae NOT DETECTED NOT DETECTED Final   Neisseria meningitidis NOT DETECTED NOT DETECTED Final   Pseudomonas aeruginosa NOT DETECTED NOT DETECTED Final   Stenotrophomonas maltophilia NOT DETECTED NOT DETECTED Final   Candida albicans NOT DETECTED NOT DETECTED Final   Candida auris NOT DETECTED NOT DETECTED Final   Candida glabrata NOT DETECTED NOT DETECTED Final   Candida krusei NOT DETECTED NOT DETECTED Final   Candida parapsilosis NOT DETECTED NOT DETECTED Final   Candida tropicalis NOT DETECTED NOT DETECTED Final   Cryptococcus neoformans/gattii NOT DETECTED NOT DETECTED Final   Methicillin resistance mecA/C DETECTED (A) NOT DETECTED Final    Comment: CRITICAL RESULT CALLED TO, READ BACK BY AND VERIFIED WITH: [place] ON 07/28/21 BY SS Performed at [facility], [address] [place], [place] [address]   CULTURE, BLOOD (ROUTINE X 2) w Reflex to ID Panel     Status: None (Preliminary result)   Collection Time: 07/27/21  4:56 PM   Specimen: BLOOD  Result Value Ref Range Status   Specimen Description BLOOD BLOOD RIGHT HAND  Final   Special Requests   Final    BOTTLES DRAWN AEROBIC AND ANAEROBIC Blood Culture adequate volume   Culture   Final    NO GROWTH 4 DAYS Performed at [facility], [address] [place], [place] [address]    Report Status PENDING  Incomplete  CULTURE, BLOOD (ROUTINE X 2) w Reflex to ID Panel     Status: None (Preliminary result)   Collection Time: 07/28/21  8:59 PM   Specimen:  BLOOD  Result Value Ref Range Status   Specimen Description BLOOD BLOOD RIGHT HAND  Final   Special Requests   Final    BOTTLES DRAWN AEROBIC AND ANAEROBIC Blood Culture results may not be optimal due to an inadequate volume of blood received in culture bottles   Culture  Setup Time   Final    TEST CANCELLED PER MD Dr. Delaine Lame Performed at [facility], [address] [place], [place] [address]    Report Status PENDING  Incomplete    Coagulation Studies:  No results for input(s): LABPROT, INR in the last 72 hours.    Urinalysis: No results for input(s): COLORURINE, LABSPEC, PHURINE, GLUCOSEU, HGBUR, BILIRUBINUR, KETONESUR, PROTEINUR, UROBILINOGEN, NITRITE, LEUKOCYTESUR in the last 72 hours.  Invalid input(s): APPERANCEUR      Imaging: CT ABDOMEN PELVIS WO CONTRAST  Result Date: 07/29/2021 CLINICAL DATA:  Pararenal abscess status post percutaneous drainage with persistent fevers. EXAM: CT ABDOMEN AND PELVIS WITHOUT CONTRAST TECHNIQUE: Multidetector CT imaging of the abdomen and pelvis was performed following the standard protocol without IV contrast. COMPARISON:  07/20/2021, drainage procedure 07/21/2021 FINDINGS: Lower chest: Small left pleural effusion is present with subtotal collapse of the left lower lobe, progressive since prior examination. Mild right basilar atelectasis present. Central venous catheter tip noted within the right atrium. Cardiac size within normal limits. No pericardial effusion. Hepatobiliary: No focal liver abnormality is seen. No gallstones, gallbladder wall thickening, or biliary dilatation. Pancreas: Unremarkable Spleen: Unremarkable Adrenals/Urinary Tract: The adrenal glands are unremarkable. The kidneys are normal in size and position. Nonobstructing caliceal calculi are again identified within the lower pole of the right kidney measuring up to 10 mm and within the left kidney measuring up to 23 mm within the upper pole, unchanged from prior  examination. Medullary calcification, possibly post infectious or posttraumatic is again noted within the upper pole of the left kidney. Bilateral ureteral stents are in expected position and previously noted hydronephrosis has resolved on the left and nearly resolved on the right. Foley catheter balloon is seen within a decompressed bladder lumen. Since the prior examination, a percutaneous drainage catheter passes between the ninth and tenth ribs laterally and appears looped within the anterior pararenal space adjacent to the upper pole of the left kidney with complete evacuation of the previously noted pararenal fluid collection in this location. However, the separate curvilinear fluid collection within the anterior pararenal space measuring 10.1 x 2.2 cm by roughly 9.3 cm in craniocaudal dimension is unchanged. This does not appear to communicate with the percutaneous drainage catheter. Stomach/Bowel: Nasogastric tube extends into the mid to distal body of the stomach. Rectal catheter balloon noted within the a rectal vault. Mild sigmoid diverticulosis. The stomach, small bowel, and large bowel are otherwise unremarkable. Appendix normal. No free intraperitoneal gas or fluid. Vascular/Lymphatic: Mild aortoiliac atherosclerotic calcification. No aortic aneurysm. No pathologic adenopathy within the abdomen and pelvis. Reproductive: Heterogeneous enhancement of the uterine corpus may relate to multiple underlying small uterine fibroids. The pelvic organs are otherwise unremarkable. Other: Dermal thickening involving the a pannus appears stable since prior examination and may be post inflammatory or postsurgical in nature, but is nonspecific. Tiny fat containing umbilical hernia. Musculoskeletal: No acute bone abnormality. Degenerative changes are seen within the lumbar spine. Advanced degenerative changes are seen within the hips bilaterally, right greater than left. No lytic or blastic bone lesion. IMPRESSION:  Interval percutaneous drainage of the a perirenal fluid collection adjacent to the upper pole of the left kidney with complete evacuation of this collection. Note that the catheter extends through the left ninth intercostal space. Interval development of a small dependent left pleural effusion. Persistent unchanged curvilinear fluid collection within the left anterior pararenal space measuring up to 10.1 cm in greatest dimension. This does not appear to communicate with the percutaneous drainage catheter placed within the perirenal collection. Stable bilateral nonobstructing nephrolithiasis. No urolithiasis. Bilateral double-J ureteral stents are in expected position with interval near complete resolution of hydronephrosis. Interval Foley decompression of the bladder. Aortic Atherosclerosis (ICD10-I70.0). Electronically Signed  By: Fidela Salisbury M.D.   On: 07/29/2021 21:15     Medications:    sodium chloride Stopped (07/30/21 2024)   amphotericin B  Conventional (FUNGIZONE) IV Stopped (07/31/21 1036)   feeding supplement (GLUCERNA 1.5 CAL) 60 mL/hr at 07/31/21 1200    sodium chloride   Intravenous Once   sodium chloride   Intravenous Once   acetaminophen (TYLENOL) oral liquid 160 mg/5 mL  650 mg Per Tube Q24H   amLODipine  10 mg Per Tube Daily   chlorhexidine  15 mL Mouth Rinse BID   Chlorhexidine Gluconate Cloth  6 each Topical Q0600   cloNIDine  0.1 mg Per Tube Daily   collagenase   Topical Daily   dextrose  10 mL Intravenous Q24H   dextrose  10 mL Intravenous Q24H   epoetin (EPOGEN/PROCRIT) injection  20,000 Units Subcutaneous Weekly   free water  125 mL Per Tube Q4H   furosemide  80 mg Intravenous Q12H   haloperidol lactate  1 mg Intravenous Once   heparin injection (subcutaneous)  5,000 Units Subcutaneous Q8H   insulin aspart  0-20 Units Subcutaneous Q4H   insulin aspart  6 Units Subcutaneous Q4H   insulin detemir  30 Units Subcutaneous BID   mouth rinse  15 mL Mouth Rinse q12n4p    metoprolol tartrate  100 mg Per Tube BID   multivitamin  1 tablet Per Tube QHS   nutrition supplement (JUVEN)  1 packet Per Tube BID BM   pantoprazole (PROTONIX) IV  40 mg Intravenous Q12H   potassium chloride  40 mEq Per Tube Daily     Assessment/ Plan:  Ms. LATASIA SILBERSTEIN is a 74 y.o. white female with diabetes mellitus type II, hypertension, hyperlipidemia, nephrolithiasis, depression, gout who is admitted to [facility] on 06/27/2021 for Hyperkalemia [E87.5] Hyponatremia [E87.1] ARF (acute renal failure) (HCC) [N17.9] Acute renal failure (ARF) (HCC) [N17.9] Acute respiratory failure with hypoxia ([place]) [J96.01] Demand ischemia of myocardium (HCC) [I24.8] Acute respiratory failure with hypoxemia (HCC) [J96.01] Acute renal failure superimposed on chronic kidney disease, unspecified CKD stage, unspecified acute renal failure type ([place]) [N17.9, N18.9] Sepsis, due to unspecified organism, unspecified whether acute organ dysfunction present [facility]) [A41.9]  Acute Kidney Injury on chronic kidney disease stage IV with baseline creatinine 1.97 and GFR of 25 on 12/22/19.  Acute kidney injury secondary to sepsis and obstructive uropathy. Chronic kidney disease is likely secondary to diabetic nephropathy.Nonoliguric urine output.    -Patient has good urine output at 3.7 L.  We plan to repeat renal parameters today as renal function was last checked on 07/29/2021.  Lab Results  Component Value Date   CREATININE 2.26 (H) 07/29/2021   CREATININE 2.26 (H) 07/28/2021   CREATININE 2.38 (H) 07/27/2021    Intake/Output Summary (Last 24 hours) at 07/31/2021 1306 Last data filed at 07/31/2021 1200 Gross per 24 hour  Intake 4485.07 ml  Output 3780 ml  Net 705.07 ml    2. Acute Respiratory failure secondary to community acquired pneumonia failing outpatient antibiotics. Febrile.  -Still maintained on high flow nasal cannula.  3. Anemia of chronic kidney disease Normocytic Lab Results  Component Value Date   HGB  7.8 (L) 07/30/2021   -Maintain the patient on Epogen 20,000 units subcutaneous weekly.  4.  Diabetes mellitus type II with chronic kidney disease insulin dependent. Most recent hemoglobin A1c is 8.3% on 06/12/21.  Metformin held    5.Bilateral Nephrolithiasis- with obstructive uropathy and left renal abscess Abscess drained on August 26  Repeat renal ultrasound 9/2-no change in complex fluid collection noted adjacent to the left kidney. CT scan abdomen pelvis without contrast 07/29/2021: Persistent fluid collection in the anterior pararenal space.  This latest fluid collection does not communicate with the prior and will likely require drainage.  6. Hypokalemia: -Repeat serum potassium today.       LOS: 34 Jordane Hisle 9/5/20221:06 PM

## 2021-07-31 NOTE — Consult Note (Signed)
[facility] Nurse wound follow up Wound type: Coccygeal wound, Unstageable PI Measurement:3cm x 2.5cm with depth obscured by the presence of nonviable tissue.  Surrounding skin to this centrally located ulcer has now reepithelialized/healed. Wound bed:As described above Drainage (amount, consistency, odor) consistent with enzymatically debriding nonviable tissue (light yellow) Periwound: as noted above.  Newly reepithelialized tissue. Dressing procedure/placement/frequency: Very little stool is passing through FlexiSeal indwelling bowel management device. Patient turns and repositions more easily than in the past and is responsive to questions. The Bedside RN yesterday reported an area of nonblanching erythema (Stage 1 PI) to the right heel, the Bedside RN and I assess this today and the area is now blanching. Patient is refusing the Pressure redistribution heel boots, however, the right heel has a silicone foam dressing and both heels are being floated.  The pressure injury tissue outside of the footprint of the central area has improved over the course of this week. Continue POC to the area using collagenase/Santyl to the Unstageable PI wound, topping with a saline moistened gauze dressing, covering with a dry gauze dressing and topping with a silicone bordered foam with the "tip" oriented away from the anus.  [facility] nursing team will follow, seeing every 7-10 days and will remain available to this patient, the nursing and medical teams.   Thanks, Maudie Flakes, MSN, RN, [place], Arther Abbott  Pager# [phone]

## 2021-07-31 NOTE — Consult Note (Signed)
PHARMACY CONSULT NOTE - FOLLOW UP  Pharmacy Consult for Electrolyte Monitoring and Replacement   Recent Labs: Potassium (mmol/L)  Date Value  07/31/2021 3.9  01/29/2014 4.0   Magnesium (mg/dL)  Date Value  07/31/2021 2.2  01/29/2014 1.8   Calcium (mg/dL)  Date Value  07/31/2021 9.6   Calcium, Total (mg/dL)  Date Value  01/29/2014 9.7   Albumin (g/dL)  Date Value  07/31/2021 2.0 (L)  07/19/2015 4.5  01/27/2014 4.1   Phosphorus (mg/dL)  Date Value  07/31/2021 3.7  07/31/2021 3.6   Sodium (mmol/L)  Date Value  07/31/2021 145  07/19/2015 142  01/29/2014 133 (L)     Assessment: 75yo female w/ h/o HTN, DM, HLD, & MDD presenting with weakness/AKI/sepsis with growth of Candida glabrata pyelonephritis from 8/17 Ucx. Nephrology consulted. Pharmacy consulted to manage PRN mgmt of electrolytes -on amphotericin(can cause hypo/hyperkalemia, hypomagnesemia)  Goal of Therapy:  [place] WNL  Plan:   K 3.9  Mag 2.2  Phos 3.6   Scr 2.01 -on lasix 80mg  IV q12h, KCL 40 meq PO daily No replacement needed at this time.  Will f/u with BMP in the AM.   Noralee Space ,PharmD Clinical Pharmacist 07/31/2021 2:18 PM

## 2021-07-31 NOTE — Progress Notes (Addendum)
18 Days Post-Op Subjective: Patient reports no complaints.  With her family.  Dr. Delaine Lame at bedside.  Objective: Vital signs in last 24 hours: Temp:  [98 F (36.7 C)-99.6 F (37.6 C)] 99.2 F (37.3 C) (09/05 0800) Pulse Rate:  [82-105] 93 (09/05 1200) Resp:  [13-29] 19 (09/05 1200) BP: (123-163)/(50-128) 134/98 (09/05 1200) SpO2:  [88 %-100 %] 100 % (09/05 1200) FiO2 (%):  [60 %-63 %] 60 % (09/05 1000)  Intake/Output from previous day: 09/04 0701 - 09/05 0700 In: 2428.2 [I.V.:65.9; NG/GT:1312; IV Piggyback:1000.3] Out: 3830 [Urine:3755; Stool:75] Intake/Output this shift: Total I/O In: 2463.5 [I.V.:883.5; NG/GT:1080; IV Piggyback:500] Out: 950 [Urine:950]  Physical Exam:  NAD Urine clear   Lab Results: Recent Labs    07/30/21 1227  HGB 7.8*  HCT 24.1*   BMET Recent Labs    07/29/21 0615 07/30/21 0626  NA 146*  --   K 3.3* 3.8  CL 108  --   CO2 25  --   GLUCOSE 216*  --   BUN 87*  --   CREATININE 2.26*  --   CALCIUM 9.3  --    No results for input(s): LABPT, INR in the last 72 hours. No results for input(s): LABURIN in the last 72 hours. Results for orders placed or performed during the hospital encounter of 06/27/21  Resp Panel by RT-PCR (Flu A&B, Covid) Nasopharyngeal Swab     Status: None   Collection Time: 06/27/21 12:32 AM   Specimen: Nasopharyngeal Swab; Nasopharyngeal(NP) swabs in vial transport medium  Result Value Ref Range Status   SARS Coronavirus 2 by RT PCR NEGATIVE NEGATIVE Final    Comment: (NOTE) SARS-CoV-2 target nucleic acids are NOT DETECTED.  The SARS-CoV-2 RNA is generally detectable in upper respiratory specimens during the acute phase of infection. The lowest concentration of SARS-CoV-2 viral copies this assay can detect is 138 copies/mL. A negative result does not preclude SARS-Cov-2 infection and should not be used as the sole basis for treatment or other patient management decisions. A negative result may occur with   improper specimen collection/handling, submission of specimen other than nasopharyngeal swab, presence of viral mutation(s) within the areas targeted by this assay, and inadequate number of viral copies(<138 copies/mL). A negative result must be combined with clinical observations, patient history, and epidemiological information. The expected result is Negative.  Fact Sheet for Patients:  EntrepreneurPulse.com.au  Fact Sheet for Healthcare Providers:  IncredibleEmployment.be  This test is no t yet approved or cleared by the [place] FDA and  has been authorized for detection and/or diagnosis of SARS-CoV-2 by FDA under an Emergency Use Authorization (EUA). This EUA will remain  in effect (meaning this test can be used) for the duration of the COVID-19 declaration under Section 564(b)(1) of the Act, 21 U.S.C.section 360bbb-3(b)(1), unless the authorization is terminated  or revoked sooner.       Influenza A by PCR NEGATIVE NEGATIVE Final   Influenza B by PCR NEGATIVE NEGATIVE Final    Comment: (NOTE) The Xpert Xpress SARS-CoV-2/FLU/RSV plus assay is intended as an aid in the diagnosis of influenza from Nasopharyngeal swab specimens and should not be used as a sole basis for treatment. Nasal washings and aspirates are unacceptable for Xpert Xpress SARS-CoV-2/FLU/RSV testing.  Fact Sheet for Patients: EntrepreneurPulse.com.au  Fact Sheet for Healthcare Providers: IncredibleEmployment.be  This test is not yet approved or cleared by the [place] FDA and has been authorized for detection and/or diagnosis of SARS-CoV-2 by FDA under an Emergency Use  Authorization (EUA). This EUA will remain in effect (meaning this test can be used) for the duration of the COVID-19 declaration under Section 564(b)(1) of the Act, 21 U.S.C. section 360bbb-3(b)(1), unless the authorization is terminated  or revoked.  Performed at [facility], [address] [place], [place] [address]   Blood culture (routine x 2)     Status: None   Collection Time: 06/27/21 12:43 AM   Specimen: BLOOD  Result Value Ref Range Status   Specimen Description BLOOD RIGHT ASSIST CONTROL  Final   Special Requests   Final    BOTTLES DRAWN AEROBIC AND ANAEROBIC Blood Culture results may not be optimal due to an inadequate volume of blood received in culture bottles   Culture   Final    NO GROWTH 5 DAYS Performed at [facility], [address] [place], [place] [address]    Report Status 07/02/2021 FINAL  Final  Blood culture (routine x 2)     Status: None   Collection Time: 06/27/21  1:45 AM   Specimen: BLOOD  Result Value Ref Range Status   Specimen Description BLOOD RIGHT ASSIST CONTROL  Final   Special Requests   Final    BOTTLES DRAWN AEROBIC AND ANAEROBIC Blood Culture adequate volume   Culture   Final    NO GROWTH 5 DAYS Performed at [facility], [address] [place], [place] [address]    Report Status 07/02/2021 FINAL  Final  Urine Culture     Status: Abnormal   Collection Time: 06/27/21  2:39 AM   Specimen: In/Out Cath Urine  Result Value Ref Range Status   Specimen Description   Final    IN/OUT CATH URINE Performed at [facility], [address] [place], [place] [address]    Special Requests   Final    NONE Performed at [facility], [place], [place] [address]    Culture 30,000 COLONIES/mL YEAST (A)  Final   Report Status 06/28/2021 FINAL  Final  MRSA Next Gen by PCR, Nasal     Status: None   Collection Time: 06/30/21  5:01 AM   Specimen: Nasal Mucosa; Nasal Swab  Result Value Ref Range Status   MRSA by PCR Next Gen NOT DETECTED NOT DETECTED Final    Comment: (NOTE) The GeneXpert MRSA Assay (FDA approved for NASAL specimens only), is one component of a comprehensive MRSA colonization surveillance program. It  is not intended to diagnose MRSA infection nor to guide or monitor treatment for MRSA infections. Test performance is not FDA approved in patients less than 15 years old. Performed at [facility], [address] [place], [place] [address]   Urine Culture     Status: Abnormal   Collection Time: 07/05/21 10:36 AM   Specimen: PATH Other; Urine  Result Value Ref Range Status   Specimen Description   Final    URINE, RANDOM LEFT RENAL PELVIS URINE Performed at [facility], [address], [place],  [address]    Special Requests   Final    NONE Performed at [facility], [place],  [address]    Culture 70,000 COLONIES/mL CANDIDA GLABRATA (A)  Final   Report Status 07/12/2021 FINAL  Final  Urine Culture     Status: Abnormal   Collection Time: 07/05/21 10:48 AM   Specimen: PATH Other; Urine  Result Value Ref Range Status   Specimen Description   Final  URINE, RANDOM RIGHT KIDNEY URINE Performed at [facility], [address], [place], [place] [address]    Special Requests   Final    NONE Performed at [facility], [address], [place], [place] [address]    Culture (A)  Final    >=100,000 COLONIES/mL CANDIDA GLABRATA SEE SEPARATE REPORT Performed at [facility], [place] [address] [place], [place] [address]    Report Status 07/17/2021 FINAL  Final  Antifungal AST 9 Drug Panel     Status: None   Collection Time: 07/05/21 10:48 AM  Result Value Ref Range Status   Organism ID, Yeast Candida glabrata  Corrected    Comment: (NOTE) Identification performed by account, not confirmed by this laboratory. CORRECTED ON 08/21 AT 1535: PREVIOUSLY REPORTED AS Preliminary report    Amphotericin B MIC 0.5 ug/mL  Final    Comment: (NOTE) Breakpoints have been established for only some organism-drug combinations as indicated. This test was developed and its performance characteristics determined by  [facility]. It has not been cleared or approved by the Food and Drug Administration.    Anidulafungin MIC Comment  Final    Comment: (NOTE) 0.03 ug/mL Susceptible Breakpoints have been established for only some organism-drug combinations as indicated. This test was developed and its performance characteristics determined by [facility]. It has not been cleared or approved by the Food and Drug Administration.    Caspofungin MIC Comment  Final    Comment: (NOTE) 0.06 ug/mL Susceptible Breakpoints have been established for only some organism-drug combinations as indicated. This test was developed and its performance characteristics determined by [facility]. It has not been cleared or approved by the Food and Drug Administration.    Micafungin MIC Comment  Final    Comment: (NOTE) 0.016 ug/mL Susceptible Breakpoints have been established for only some organism-drug combinations as indicated. This test was developed and its performance characteristics determined by [facility]. It has not been cleared or approved by the Food and Drug Administration.    Posaconazole MIC 0.25 ug/mL  Final    Comment: (NOTE) Breakpoints have been established for only some organism-drug combinations as indicated. This test was developed and its performance characteristics determined by [facility]. It has not been cleared or approved by the Food and Drug Administration.    Fluconazole Islt MIC 2.0 ug/mL  Final    Comment: (NOTE) Susceptible Dose Dependent Breakpoints have been established for only some organism-drug combinations as indicated. This test was developed and its performance characteristics determined by [facility]. It has not been cleared or approved by the Food and Drug Administration.    Flucytosine MIC 0.06 ug/mL or less  Final    Comment: (NOTE) Breakpoints have been established for only some organism-drug combinations as indicated. This test was developed and its performance  characteristics determined by [facility]. It has not been cleared or approved by the Food and Drug Administration.    Itraconazole MIC 0.12 ug/mL  Final    Comment: (NOTE) Breakpoints have been established for only some organism-drug combinations as indicated. This test was developed and its performance characteristics determined by [facility]. It has not been cleared or approved by the Food and Drug Administration.    Voriconazole MIC 0.06 ug/mL  Final    Comment: (NOTE) Breakpoints have been established for only some organism-drug combinations as indicated. This test was developed and its performance characteristics determined by [facility]. It has not been cleared or approved by the Food and Drug Administration. Performed At: [facility] [address]  [place], [place] [id number] Rush Farmer MD [id number]    Source CANDIDA GLABRATA SUSCEPTIBILITY URINE CULTURE  Final    Comment: Performed at [facility], [place] [address] [place], [place] [address]  CULTURE, BLOOD (ROUTINE X 2) w Reflex to ID Panel     Status: None   Collection Time: 07/15/21  7:57 PM   Specimen: BLOOD  Result Value Ref Range Status   Specimen Description BLOOD LEFT ASSIST CONTROL  Final   Special Requests   Final    BOTTLES DRAWN AEROBIC AND ANAEROBIC Blood Culture adequate volume   Culture   Final    NO GROWTH 5 DAYS Performed at [facility], [address] [place], [place] [address]    Report Status 07/20/2021 FINAL  Final  CULTURE, BLOOD (ROUTINE X 2) w Reflex to ID Panel     Status: None   Collection Time: 07/15/21  8:07 PM   Specimen: BLOOD  Result Value Ref Range Status   Specimen Description BLOOD LEFT FOREARM  Final   Special Requests   Final    BOTTLES DRAWN AEROBIC AND ANAEROBIC Blood Culture adequate volume   Culture   Final    NO GROWTH 5 DAYS Performed at [facility], [address] [place], [place] [address]    Report Status 07/20/2021 FINAL  Final   Body fluid culture w Gram Stain     Status: None (Preliminary result)   Collection Time: 07/21/21  1:23 PM   Specimen: Peritoneal Washings; Body Fluid  Result Value Ref Range Status   Specimen Description   Final    PERITONEAL Performed at [facility], [address] [place], [place] [address]    Special Requests   Final    NONE Performed at [facility], [address] [place], [place] [address]    Gram Stain   Final    MODERATE WBC PRESENT,BOTH PMN AND MONONUCLEAR NO ORGANISMS SEEN    Culture   Final    RARE CANDIDA GLABRATA Sent to [place] for further susceptibility testing. Performed at [facility], [place] [address] [place],  [address]    Report Status PENDING  Incomplete  Antifungal AST 9 Drug Panel     Status: None   Collection Time: 07/21/21  1:23 PM  Result Value Ref Range Status   Organism ID, Yeast Candida glabrata  Corrected    Comment: (NOTE) Identification performed by account, not confirmed by this laboratory. CORRECTED ON 09/04 AT 1435: PREVIOUSLY REPORTED AS Preliminary report    Amphotericin B MIC 0.5 ug/mL  Final    Comment: (NOTE) Breakpoints have been established for only some organism-drug combinations as indicated. This test was developed and its performance characteristics determined by [facility]. It has not been cleared or approved by the Food and Drug Administration.    Anidulafungin MIC Comment  Final    Comment: (NOTE) 0.03 ug/mL Susceptible Breakpoints have been established for only some organism-drug combinations as indicated. This test was developed and its performance characteristics determined by [facility]. It has not been cleared or approved by the Food and Drug Administration.    Caspofungin MIC Comment  Final    Comment: (NOTE) 0.06 ug/mL Susceptible Breakpoints have been established for only some organism-drug combinations as indicated. This test was developed and its performance  characteristics determined by [facility]. It has not been cleared or approved by the Food and Drug Administration.    Micafungin MIC Comment  Final  Comment: (NOTE) 0.016 ug/mL Susceptible Breakpoints have been established for only some organism-drug combinations as indicated. This test was developed and its performance characteristics determined by [facility]. It has not been cleared or approved by the Food and Drug Administration.    Posaconazole MIC 0.25 ug/mL  Final    Comment: (NOTE) Breakpoints have been established for only some organism-drug combinations as indicated. This test was developed and its performance characteristics determined by [facility]. It has not been cleared or approved by the Food and Drug Administration.    Fluconazole Islt MIC 4.0 ug/mL  Final    Comment: (NOTE) Susceptible Dose Dependent Breakpoints have been established for only some organism-drug combinations as indicated. This test was developed and its performance characteristics determined by [facility]. It has not been cleared or approved by the Food and Drug Administration.    Flucytosine MIC 0.06 ug/mL or less  Final    Comment: (NOTE) Breakpoints have been established for only some organism-drug combinations as indicated. This test was developed and its performance characteristics determined by [facility]. It has not been cleared or approved by the Food and Drug Administration.    Itraconazole MIC 0.25 ug/mL  Final    Comment: (NOTE) Breakpoints have been established for only some organism-drug combinations as indicated. This test was developed and its performance characteristics determined by [facility]. It has not been cleared or approved by the Food and Drug Administration.    Voriconazole MIC 0.06 ug/mL  Final    Comment: (NOTE) Breakpoints have been established for only some organism-drug combinations as indicated. This test was developed and its performance characteristics determined by  [facility]. It has not been cleared or approved by the Food and Drug Administration. Performed At: [facility] [place], [place] [id number] Rush Farmer MD AS:[id number]    Source CANDIDA GLABRATA/ PERITONEAL FLUID  Final    Comment: Performed at [facility], [place] [address], [place], [place] [address]  Aerobic/Anaerobic Culture w Gram Stain (surgical/deep wound)     Status: None   Collection Time: 07/21/21  3:32 PM   Specimen: Abdomen  Result Value Ref Range Status   Specimen Description   Final    ABDOMEN Performed at [facility], [address] [place], [place] [address]    Special Requests   Final    NONE Performed at [facility], [address] [place], [place] [address]    Gram Stain   Final    MODERATE SQUAMOUS EPITHELIAL CELLS PRESENT WBC PRESENT, PREDOMINANTLY PMN NO ORGANISMS SEEN    Culture   Final    RARE CANDIDA GLABRATA NO ANAEROBES ISOLATED Performed at [facility], [place] [address] [place], [place] [address]    Report Status 07/26/2021 FINAL  Final  CULTURE, BLOOD (ROUTINE X 2) w Reflex to ID Panel     Status: Abnormal   Collection Time: 07/27/21  4:55 PM   Specimen: BLOOD  Result Value Ref Range Status   Specimen Description   Final    BLOOD BLOOD LEFT HAND Performed at [facility], [address] [place], [place] [address]    Special Requests   Final    BOTTLES DRAWN AEROBIC AND ANAEROBIC Blood Culture results may not be optimal due to an inadequate volume of blood received in culture bottles Performed at [facility], [address], [place], [place] [address]    Culture  Setup Time   Final    Organism ID to follow [place]  [facility] AEROBIC BOTTLE ONLY CRITICAL RESULT CALLED TO, READ BACK BY AND VERIFIED WITH: DIRECTV WATSON AT 1940 BY SS Performed at [facility], [address] [place], [place] [address]    Culture (A)  Final    STAPHYLOCOCCUS  EPIDERMIDIS THE SIGNIFICANCE OF ISOLATING THIS ORGANISM FROM A SINGLE SET OF BLOOD CULTURES WHEN MULTIPLE SETS ARE DRAWN IS UNCERTAIN. PLEASE NOTIFY THE MICROBIOLOGY DEPARTMENT WITHIN ONE WEEK IF SPECIATION AND SENSITIVITIES ARE REQUIRED. Performed at [facility], [place] [address] [place], [place] [address]    Report Status 07/31/2021 FINAL  Final  Blood Culture ID Panel (Reflexed)     Status: Abnormal   Collection Time: 07/27/21  4:55 PM  Result Value Ref Range Status   Enterococcus faecalis NOT DETECTED NOT DETECTED Final   Enterococcus Faecium NOT DETECTED NOT DETECTED Final   Listeria monocytogenes NOT DETECTED NOT DETECTED Final   Staphylococcus species DETECTED (A) NOT DETECTED Final    Comment: CRITICAL RESULT CALLED TO, READ BACK BY AND VERIFIED WITH: SUZANE WATSON AT 1940 ON 07/28/21 BY SS    Staphylococcus aureus (BCID) NOT DETECTED NOT DETECTED Final   Staphylococcus epidermidis DETECTED (A) NOT DETECTED Final    Comment: Methicillin (oxacillin) resistant coagulase negative staphylococcus. Possible blood culture contaminant (unless isolated from more than one blood culture draw or clinical case suggests pathogenicity). No antibiotic treatment is indicated for blood  culture contaminants. CRITICAL RESULT CALLED TO, READ BACK BY AND VERIFIED WITH: [place] ON 07/28/21 BY SS    Staphylococcus lugdunensis NOT DETECTED NOT DETECTED Final   Streptococcus species NOT DETECTED NOT DETECTED Final   Streptococcus agalactiae NOT DETECTED NOT DETECTED Final   Streptococcus pneumoniae NOT DETECTED NOT DETECTED Final   Streptococcus pyogenes NOT DETECTED NOT DETECTED Final   A.calcoaceticus-baumannii NOT DETECTED NOT DETECTED Final   Bacteroides fragilis NOT DETECTED NOT DETECTED Final   Enterobacterales NOT DETECTED NOT DETECTED Final   Enterobacter cloacae complex NOT DETECTED NOT DETECTED Final   Escherichia coli NOT DETECTED NOT DETECTED Final   Klebsiella aerogenes  NOT DETECTED NOT DETECTED Final   Klebsiella oxytoca NOT DETECTED NOT DETECTED Final   Klebsiella pneumoniae NOT DETECTED NOT DETECTED Final   Proteus species NOT DETECTED NOT DETECTED Final   Salmonella species NOT DETECTED NOT DETECTED Final   Serratia marcescens NOT DETECTED NOT DETECTED Final   Haemophilus influenzae NOT DETECTED NOT DETECTED Final   Neisseria meningitidis NOT DETECTED NOT DETECTED Final   Pseudomonas aeruginosa NOT DETECTED NOT DETECTED Final   Stenotrophomonas maltophilia NOT DETECTED NOT DETECTED Final   Candida albicans NOT DETECTED NOT DETECTED Final   Candida auris NOT DETECTED NOT DETECTED Final   Candida glabrata NOT DETECTED NOT DETECTED Final   Candida krusei NOT DETECTED NOT DETECTED Final   Candida parapsilosis NOT DETECTED NOT DETECTED Final   Candida tropicalis NOT DETECTED NOT DETECTED Final   Cryptococcus neoformans/gattii NOT DETECTED NOT DETECTED Final   Methicillin resistance mecA/C DETECTED (A) NOT DETECTED Final    Comment: CRITICAL RESULT CALLED TO, READ BACK BY AND VERIFIED WITH: [place] ON 07/28/21 BY SS Performed at [facility], [address] [place], [place] [address]   CULTURE, BLOOD (ROUTINE X 2) w Reflex to ID Panel     Status: None (Preliminary result)   Collection Time: 07/27/21  4:56 PM   Specimen: BLOOD  Result Value Ref Range Status   Specimen Description BLOOD BLOOD RIGHT HAND  Final   Special  Requests   Final    BOTTLES DRAWN AEROBIC AND ANAEROBIC Blood Culture adequate volume   Culture   Final    NO GROWTH 4 DAYS Performed at [facility], [address] [place], [place] [address]    Report Status PENDING  Incomplete  CULTURE, BLOOD (ROUTINE X 2) w Reflex to ID Panel     Status: None (Preliminary result)   Collection Time: 07/28/21  8:59 PM   Specimen: BLOOD  Result Value Ref Range Status   Specimen Description BLOOD BLOOD RIGHT HAND  Final   Special Requests   Final    BOTTLES DRAWN  AEROBIC AND ANAEROBIC Blood Culture results may not be optimal due to an inadequate volume of blood received in culture bottles   Culture   Final    NO GROWTH 3 DAYS Performed at [facility], [address] [place], [place] [address]    Report Status PENDING  Incomplete    Studies/Results: CT ABDOMEN PELVIS WO CONTRAST  Result Date: 07/29/2021 CLINICAL DATA:  Pararenal abscess status post percutaneous drainage with persistent fevers. EXAM: CT ABDOMEN AND PELVIS WITHOUT CONTRAST TECHNIQUE: Multidetector CT imaging of the abdomen and pelvis was performed following the standard protocol without IV contrast. COMPARISON:  07/20/2021, drainage procedure 07/21/2021 FINDINGS: Lower chest: Small left pleural effusion is present with subtotal collapse of the left lower lobe, progressive since prior examination. Mild right basilar atelectasis present. Central venous catheter tip noted within the right atrium. Cardiac size within normal limits. No pericardial effusion. Hepatobiliary: No focal liver abnormality is seen. No gallstones, gallbladder wall thickening, or biliary dilatation. Pancreas: Unremarkable Spleen: Unremarkable Adrenals/Urinary Tract: The adrenal glands are unremarkable. The kidneys are normal in size and position. Nonobstructing caliceal calculi are again identified within the lower pole of the right kidney measuring up to 10 mm and within the left kidney measuring up to 23 mm within the upper pole, unchanged from prior examination. Medullary calcification, possibly post infectious or posttraumatic is again noted within the upper pole of the left kidney. Bilateral ureteral stents are in expected position and previously noted hydronephrosis has resolved on the left and nearly resolved on the right. Foley catheter balloon is seen within a decompressed bladder lumen. Since the prior examination, a percutaneous drainage catheter passes between the ninth and tenth ribs laterally and appears  looped within the anterior pararenal space adjacent to the upper pole of the left kidney with complete evacuation of the previously noted pararenal fluid collection in this location. However, the separate curvilinear fluid collection within the anterior pararenal space measuring 10.1 x 2.2 cm by roughly 9.3 cm in craniocaudal dimension is unchanged. This does not appear to communicate with the percutaneous drainage catheter. Stomach/Bowel: Nasogastric tube extends into the mid to distal body of the stomach. Rectal catheter balloon noted within the a rectal vault. Mild sigmoid diverticulosis. The stomach, small bowel, and large bowel are otherwise unremarkable. Appendix normal. No free intraperitoneal gas or fluid. Vascular/Lymphatic: Mild aortoiliac atherosclerotic calcification. No aortic aneurysm. No pathologic adenopathy within the abdomen and pelvis. Reproductive: Heterogeneous enhancement of the uterine corpus may relate to multiple underlying small uterine fibroids. The pelvic organs are otherwise unremarkable. Other: Dermal thickening involving the a pannus appears stable since prior examination and may be post inflammatory or postsurgical in nature, but is nonspecific. Tiny fat containing umbilical hernia. Musculoskeletal: No acute bone abnormality. Degenerative changes are seen within the lumbar spine. Advanced degenerative changes are seen within the hips bilaterally, right greater than  left. No lytic or blastic bone lesion. IMPRESSION: Interval percutaneous drainage of the a perirenal fluid collection adjacent to the upper pole of the left kidney with complete evacuation of this collection. Note that the catheter extends through the left ninth intercostal space. Interval development of a small dependent left pleural effusion. Persistent unchanged curvilinear fluid collection within the left anterior pararenal space measuring up to 10.1 cm in greatest dimension. This does not appear to communicate with the  percutaneous drainage catheter placed within the perirenal collection. Stable bilateral nonobstructing nephrolithiasis. No urolithiasis. Bilateral double-J ureteral stents are in expected position with interval near complete resolution of hydronephrosis. Interval Foley decompression of the bladder. Aortic Atherosclerosis (ICD10-I70.0). Electronically Signed   By: Fidela Salisbury M.D.   On: 07/29/2021 21:15    Assessment/Plan:  Left perinephric abscess -CT 07/29/2021 confirms residual fluid anterior to the left kidney but resolution of the left upper pole collection.  Dr. Delaine Lame does recommend the fluid anterior to the left kidney be drained given the nature of Candida glabrata infections and she has spoken with IR who will remove or replace the left upper pole drain and place a left anterior drain.   Bilateral hydro, left UPJ stone and retention - s/p stenting and foley with no residual hydro on imaging and good UOP per foley. She will ultimately need ureteroscopy and laser for the stone. I'll update Dr. Bernardo Heater on her status.    LOS: 34 days   Festus Aloe 07/31/2021, 12:25 PM

## 2021-07-31 NOTE — Progress Notes (Addendum)
Daily Progress Note   Patient Name: Heather Lopez       Date: 07/31/2021 DOB: 1946-03-29  Age: 75 y.o. MRN#: [id number] Attending Physician: Nolberto Hanlon, MD Primary Care Physician: [name], PA-C Admit Date: 06/27/2021  Reason for Consultation/Follow-up: Establishing goals of care  Subjective: Patient is resting in bed with high flow cannula in place. She briefly waves at me and closes her eyes back. Husband is at bedside. He has been taking one day at a time. He is happy she is off ventilator support. He discusses her upcoming PEG. He discusses the plans for attempting to initiate an oral diet once PEG has been placed. Question of if the tube feeds could be discontinued, and NGT could be removed to see if she could tolerate and manage an oral diet prior to placing the PEG. Spoke with SLP and there has been discussion for concern for meeting caloric needs orally;this was also discussed with attending MD; husband was updated. He states she has been through a lot with sticks and pokes and procedures over the past 36 days, and wants to have her treated as indicated, but would like to minimize any sticks or procedures she does not require.   Length of Stay: 34  Current Medications: Scheduled Meds:   sodium chloride   Intravenous Once   sodium chloride   Intravenous Once   acetaminophen (TYLENOL) oral liquid 160 mg/5 mL  650 mg Per Tube Q24H   amLODipine  10 mg Per Tube Daily   chlorhexidine  15 mL Mouth Rinse BID   Chlorhexidine Gluconate Cloth  6 each Topical Q0600   cloNIDine  0.1 mg Per Tube Daily   collagenase   Topical Daily   dextrose  10 mL Intravenous Q24H   dextrose  10 mL Intravenous Q24H   epoetin (EPOGEN/PROCRIT) injection  20,000 Units Subcutaneous Weekly   free water  125 mL  Per Tube Q4H   furosemide  80 mg Intravenous Q12H   haloperidol lactate  1 mg Intravenous Once   heparin injection (subcutaneous)  5,000 Units Subcutaneous Q8H   insulin aspart  0-20 Units Subcutaneous Q4H   insulin aspart  6 Units Subcutaneous Q4H   insulin detemir  30 Units Subcutaneous BID   mouth rinse  15 mL Mouth Rinse q12n4p   metoprolol tartrate  100 mg Per Tube BID   multivitamin  1 tablet Per Tube QHS   nutrition supplement (JUVEN)  1 packet Per Tube BID BM   pantoprazole (PROTONIX) IV  40 mg Intravenous Q12H   potassium chloride  40 mEq Per Tube Daily    Continuous Infusions:  sodium chloride Stopped (07/30/21 2024)   amphotericin B  Conventional (FUNGIZONE) IV Stopped (07/31/21 1036)   feeding supplement (GLUCERNA 1.5 CAL) 60 mL/hr at 07/31/21 1100    PRN Meds: sodium chloride, acetaminophen (TYLENOL) oral liquid 160 mg/5 mL, diphenhydrAMINE **OR** diphenhydrAMINE, heparin, hydrALAZINE, lidocaine (PF), lidocaine-prilocaine, lip balm, meperidine (DEMEROL) injection, ondansetron (ZOFRAN) IV, oxyCODONE, pentafluoroprop-tetrafluoroeth  Physical Exam Constitutional:      Comments: Opens eyes briefly.             Vital Signs: BP (!) 123/59 (BP Location: Right Arm)   Pulse 82   Temp 99.2 F (37.3 C) (Axillary)   Resp 20   Ht 5' 2.01" (1.575 m)   Wt (!) 149.5 kg   SpO2 99%   BMI 60.25 kg/m  SpO2: SpO2: 99 % O2 Device: O2 Device: High Flow Nasal Cannula O2 Flow Rate: O2 Flow Rate (L/min): 40 L/min  Intake/output summary:  Intake/Output Summary (Last 24 hours) at 07/31/2021 1113 Last data filed at 07/31/2021 1036 Gross per 24 hour  Intake 4551.67 ml  Output 3680 ml  Net 871.67 ml   LBM: Last BM Date:  (Flexiseal in place. No output documented since 9/1) Baseline Weight: Weight: 99.8 kg Most recent weight: Weight: (!) 149.5 kg         Patient Active Problem List   Diagnosis Date Noted   Hypokalemia 07/20/2021   Renal abscess    Essential hypertension     Lethargy    Duodenal ulcer perforation (HCC)    Demand ischemia of myocardium (HCC)    Atrial flutter with rapid ventricular response ([place]) 07/07/2021   Endotracheally intubated 07/07/2021   Bilateral nephrolithiasis 07/07/2021   Anemia of chronic disease 07/07/2021   Pressure injury of skin 06/30/2021   Pneumoperitoneum    Acute respiratory failure with hypoxia ([place]) 06/27/2021   Severe sepsis with septic shock ([place]) 06/27/2021   Acute lower UTI 06/27/2021   Acute kidney injury superimposed on chronic kidney disease ([place]) 06/27/2021   Type 2 diabetes mellitus with stage 4 chronic kidney disease, with long-term current use of insulin ([place]) 03/03/2018   Vitamin D deficiency 08/21/2016   IBS (irritable bowel syndrome) 08/21/2016   Special screening for malignant neoplasms, colon    Benign neoplasm of transverse colon    Benign neoplasm of sigmoid colon    Uncontrolled type 2 diabetes mellitus with insulin therapy ([place]) 07/12/2015   Benign hypertension with CKD (chronic kidney disease) stage III ([place]) 07/12/2015   Hyperlipidemia associated with type 2 diabetes mellitus ([place]) 07/12/2015   Depression 07/12/2015   BMI 40.0-44.9, adult ([place]) 07/12/2015    Palliative Care Assessment & Plan    Recommendations/Plan: Continue current care.    Code Status:    Code Status Orders  (From admission, onward)           Start     Ordered   07/12/21 1523  Do not attempt resuscitation (DNR)  Continuous       Question Answer Comment  In the event of cardiac or respiratory ARREST Do not call a "code blue"   In the event of cardiac or respiratory ARREST Do not perform Intubation, CPR, defibrillation or ACLS   In  the event of cardiac or respiratory ARREST Use medication by any route, position, wound care, and other measures to relive pain and suffering. May use oxygen, suction and manual treatment of airway obstruction as needed for comfort.      07/12/21 1523           Code Status  History     Date Active Date Inactive Code Status Order ID Comments User Context   06/27/2021 0445 07/12/2021 1523 Full Code [id number]  Rise Patience, MD ED      Advance Directive Documentation    Flowsheet Row Most Recent Value  Type of Advance Directive Healthcare Power of Attorney  Pre-existing out of facility DNR order (yellow form or pink MOST form) --  "MOST" Form in Place? --       Care plan was discussed with SLP and attending MD  Thank you for allowing the Palliative Medicine Team to assist in the care of this patient.       Total Time 50 min Prolonged Time Billed  no       Greater than 50%  of this time was spent counseling and coordinating care related to the above assessment and plan.  Asencion Gowda, NP  Please contact Palliative Medicine Team phone at [phone] for questions and concerns.

## 2021-07-31 NOTE — Progress Notes (Addendum)
Date of Admission:  06/27/2021    ID: Heather Lopez is a 75 y.o. female  Active Problems:   Uncontrolled type 2 diabetes mellitus with insulin therapy ([place])   Hyperlipidemia associated with type 2 diabetes mellitus ([place])   Type 2 diabetes mellitus with stage 4 chronic kidney disease, with long-term current use of insulin (HCC)   Acute respiratory failure with hypoxia (HCC)   Severe sepsis with septic shock (HCC)   Acute lower UTI   Acute kidney injury superimposed on chronic kidney disease (HCC)   Pneumoperitoneum   Pressure injury of skin   Atrial flutter with rapid ventricular response (HCC)   Endotracheally intubated   Bilateral nephrolithiasis   Anemia of chronic disease   Demand ischemia of myocardium (HCC)   Lethargy   Duodenal ulcer perforation (HCC)   Renal abscess   Essential hypertension   Hypokalemia    Subjective: More alert, awake Husband and son at bedside Intermittent fever but none in 72 hrs    Medications:   sodium chloride   Intravenous Once   sodium chloride   Intravenous Once   acetaminophen (TYLENOL) oral liquid 160 mg/5 mL  650 mg Per Tube Q24H   amLODipine  10 mg Per Tube Daily   chlorhexidine  15 mL Mouth Rinse BID   Chlorhexidine Gluconate Cloth  6 each Topical Q0600   cloNIDine  0.1 mg Per Tube Daily   collagenase   Topical Daily   dextrose  10 mL Intravenous Q24H   dextrose  10 mL Intravenous Q24H   epoetin (EPOGEN/PROCRIT) injection  20,000 Units Subcutaneous Weekly   free water  125 mL Per Tube Q4H   furosemide  80 mg Intravenous Q12H   haloperidol lactate  1 mg Intravenous Once   heparin injection (subcutaneous)  5,000 Units Subcutaneous Q8H   insulin aspart  0-20 Units Subcutaneous Q4H   insulin aspart  6 Units Subcutaneous Q4H   insulin detemir  30 Units Subcutaneous BID   mouth rinse  15 mL Mouth Rinse q12n4p   metoprolol tartrate  100 mg Per Tube BID   multivitamin  1 tablet Per Tube QHS   nutrition supplement (JUVEN)  1 packet  Per Tube BID BM   pantoprazole (PROTONIX) IV  40 mg Intravenous Q12H   potassium chloride  40 mEq Per Tube Daily   LDA- left IJ HD catheter LUQ drain Foley  Objective: Vital signs in last 24 hours: Temp:  [98 F (36.7 C)-99.6 F (37.6 C)] 99.2 F (37.3 C) (09/05 0800) Pulse Rate:  [82-105] 82 (09/05 1100) Resp:  [13-29] 20 (09/05 1100) BP: (116-163)/(50-128) 123/59 (09/05 1100) SpO2:  [88 %-100 %] 99 % (09/05 1100) FiO2 (%):  [60 %-63 %] 60 % (09/05 1000)  PHYSICAL EXAM:  General: More alert  Head: Normocephalic, without obvious abnormality, atraumatic. Eyes: Conjunctivae clear, anicteric sclerae. Pupils are equal ENT NG tube Neck: Supple, symmetrical, no adenopathy, thyroid: non tender no carotid bruit and no JVD. Back:left flank drain- no fluid in the bulb  Lungs: b/l air entry decreased bases Heart: irregular- well controlled Abdomen: Soft, lap scar healed Extremities: atraumatic, no cyanosis. No edema. No clubbing Skin: No rashes or lesions. Or bruising Lymph: Cervical, supraclavicular normal. Neurologic: cannot assess  Lab Results Recent Labs    07/29/21 0615 07/30/21 0626 07/30/21 1227  WBC  --   --  9.6  HGB  --   --  7.8*  HCT  --   --  24.1*  NA  146*  --   --   K 3.3* 3.8  --   CL 108  --   --   CO2 25  --   --   BUN 87*  --   --   CREATININE 2.26*  --   --     Microbiology: 07/27/21 - BC- 1 of 4 staph epi   Studies/Results: CT ABDOMEN PELVIS WO CONTRAST  Result Date: 07/29/2021 CLINICAL DATA:  Pararenal abscess status post percutaneous drainage with persistent fevers. EXAM: CT ABDOMEN AND PELVIS WITHOUT CONTRAST TECHNIQUE: Multidetector CT imaging of the abdomen and pelvis was performed following the standard protocol without IV contrast. COMPARISON:  07/20/2021, drainage procedure 07/21/2021 FINDINGS: Lower chest: Small left pleural effusion is present with subtotal collapse of the left lower lobe, progressive since prior examination. Mild right  basilar atelectasis present. Central venous catheter tip noted within the right atrium. Cardiac size within normal limits. No pericardial effusion. Hepatobiliary: No focal liver abnormality is seen. No gallstones, gallbladder wall thickening, or biliary dilatation. Pancreas: Unremarkable Spleen: Unremarkable Adrenals/Urinary Tract: The adrenal glands are unremarkable. The kidneys are normal in size and position. Nonobstructing caliceal calculi are again identified within the lower pole of the right kidney measuring up to 10 mm and within the left kidney measuring up to 23 mm within the upper pole, unchanged from prior examination. Medullary calcification, possibly post infectious or posttraumatic is again noted within the upper pole of the left kidney. Bilateral ureteral stents are in expected position and previously noted hydronephrosis has resolved on the left and nearly resolved on the right. Foley catheter balloon is seen within a decompressed bladder lumen. Since the prior examination, a percutaneous drainage catheter passes between the ninth and tenth ribs laterally and appears looped within the anterior pararenal space adjacent to the upper pole of the left kidney with complete evacuation of the previously noted pararenal fluid collection in this location. However, the separate curvilinear fluid collection within the anterior pararenal space measuring 10.1 x 2.2 cm by roughly 9.3 cm in craniocaudal dimension is unchanged. This does not appear to communicate with the percutaneous drainage catheter. Stomach/Bowel: Nasogastric tube extends into the mid to distal body of the stomach. Rectal catheter balloon noted within the a rectal vault. Mild sigmoid diverticulosis. The stomach, small bowel, and large bowel are otherwise unremarkable. Appendix normal. No free intraperitoneal gas or fluid. Vascular/Lymphatic: Mild aortoiliac atherosclerotic calcification. No aortic aneurysm. No pathologic adenopathy within the  abdomen and pelvis. Reproductive: Heterogeneous enhancement of the uterine corpus may relate to multiple underlying small uterine fibroids. The pelvic organs are otherwise unremarkable. Other: Dermal thickening involving the a pannus appears stable since prior examination and may be post inflammatory or postsurgical in nature, but is nonspecific. Tiny fat containing umbilical hernia. Musculoskeletal: No acute bone abnormality. Degenerative changes are seen within the lumbar spine. Advanced degenerative changes are seen within the hips bilaterally, right greater than left. No lytic or blastic bone lesion. IMPRESSION: Interval percutaneous drainage of the a perirenal fluid collection adjacent to the upper pole of the left kidney with complete evacuation of this collection. Note that the catheter extends through the left ninth intercostal space. Interval development of a small dependent left pleural effusion. Persistent unchanged curvilinear fluid collection within the left anterior pararenal space measuring up to 10.1 cm in greatest dimension. This does not appear to communicate with the percutaneous drainage catheter placed within the perirenal collection. Stable bilateral nonobstructing nephrolithiasis. No urolithiasis. Bilateral double-J ureteral stents are in expected position  with interval near complete resolution of hydronephrosis. Interval Foley decompression of the bladder. Aortic Atherosclerosis (ICD10-I70.0). Electronically Signed   By: Fidela Salisbury M.D.   On: 07/29/2021 21:15     Assessment/Plan: Complicated UTI Left perirenal and renal abscess- drain placed on 07/21/21- culture positive for candida glabrata-  B/l renal stents- b/l renal infection with candida glabrata- on amphotericin since 07/20/21- day 12 - may stop 48 hrs after new drain placement   Intermittent fever- none in the past 72  hrs- this is due to left renal abscess that has not been drained completely The staph epidermidis 1 of 4  bottle is a contaminant and it does not need any treatment I discontinued vanco order and canceled  blood culture as it is not  needed Please contact me before starting any antibiotic as she is on amphotericin and we do not want to give other nephrotoxic drugs like vancomycin unnecessarily. Discussed with IR-Dr.Yamagata  repeated CT abdomen on 07/29/21 shows a 10 cm X 2.2 X 9cm anterior pararenal space collection not in communication with the drain She will have the old drain removed tomorrow with placement of a new drain   Duodenal perf- s/p lap and closure AKI on CKD on intermittent dialysis   Anemia  Discussed the management with the  family at bed side and the care team

## 2021-07-31 NOTE — Progress Notes (Signed)
Occupational Therapy Treatment Patient Details Name: Heather Lopez MRN: [id number] DOB: 1946-03-04 Today's Date: 07/31/2021    History of present illness Pt is a [name] who presented to the ED on 06/27/21 with chief complaints of progressive shortness of breath, productive cough, loss of appetite, nausea, dry heaving fatigue, malaise and generalized body aches. Patient initially went to her PCP on 7/29 with complaints of rhinorrhea, harsh cough, loss of appetite, nausea, fatigue, malaise and generalized body aches x2 days, patient was diagnosed with pneumonia of which she received ceftriaxone 1 mg IM and sent home with Levaquin, prednisone and Tessalon. On 8/4 pt developed acute abdominal pain with CT showing viscus perforation. Pt urgently taken to the OR for ex lap. Pt is also s/p cystoscopy with B ureteral stent placement for L obstructed kidney stone on 8/10.   OT comments  Pt seen for OT treatment on this date. Upon arrival to room, pt awake and seated upright in bed. Pt with improved cognition this date; able to converse and follow 1-step commands consistently before becoming fatigued following bed-level ADLs/therapy exercises and closing eyes at end of session. Pt continues to present with decreased strength and activity tolerance, however was able to participate in bed-level grooming tasks with MOD-MAX A this date. Following bed-level grooming tasks, pt engaged in [facility] of shoulder flexion, elbow flexion/extension, digit flexion/extension, ankle pumps, and heel slides (strength L side >R side). Pt verbalized desire to attempt unsupported long-sitting position in bed with use of bedrails, and required TOTAL assist to achieve.    Pt tearful at times during session, stating "this is just pitiful. How am I going to get better from here?", OT provided comfort. Pt is making good progress toward goals and continues to benefit from skilled OT services to maximize return to PLOF and minimize risk of future  falls, injury, caregiver burden, and readmission. Will continue to follow POC. Discharge recommendation remains appropriate.     Follow Up Recommendations  LTACH;Supervision/Assistance - 24 hour    Equipment Recommendations  Hospital bed       Precautions / Restrictions Precautions Precautions: Fall Precaution Comments: NG tube, abdominal incision & JP drains, foley catheter, fecal tube, L jugular tunneled hemodialysis catheter Restrictions Weight Bearing Restrictions: No       Mobility Bed Mobility Overal bed mobility: Needs Assistance Bed Mobility: Supine to Sit     Supine to sit: Total assist     General bed mobility comments: Pt verbalizing desire to attempt unsupported long-sitting position in bed with use of bedrails; required TOTAL assist    Transfers                 General transfer comment: not appropriate at this time        ADL either performed or assessed with clinical judgement   ADL Overall ADL's : Needs assistance/impaired     Grooming: Wash/dry hands;Wash/dry face;Moderate assistance;Brushing hair;Maximal assistance;Bed level Grooming Details (indicate cue type and reason): Pt able to use LUE to bring washcloth to face/RUE. With proximal joint support of RUE, pt able to bring washcloth to chin and bring RUE to midline for hand washing. MOD A required for thoroughness of face/hand washing. MAX A for brushing hair             Lower Body Dressing: Maximal assistance;Bed level                        Cognition Arousal/Alertness: Awake/alert;Lethargic (awake at beginning  of session, however fatiguing quickly and closing eyes at end of session) Behavior During Therapy: Flat affect Overall Cognitive Status: Impaired/Different from baseline                                 General Comments: Pt awake and alert at beginning of session, able to converse and follow 1-step commands consistently, however fatiguing with minimal  effort and closing eyes at end of session. Pt tearful at times, stating "this is just pitiful. How am I going to get better from here?". OT provided comfort        Exercises General Exercises - Upper Extremity Shoulder Flexion: AAROM;Both;10 reps;Supine Elbow Flexion: Strengthening;AAROM;Both;10 reps;Supine Elbow Extension: Strengthening;AAROM;Both;10 reps;Supine Composite Extension: PROM;Both;10 reps;Supine General Exercises - Lower Extremity Ankle Circles/Pumps: AAROM;Both;10 reps Heel Slides: AAROM;Both;10 reps           Pertinent Vitals/ Pain       Pain Assessment: No/denies pain         Frequency  Min 1X/week        Progress Toward Goals  OT Goals(current goals can now be found in the care plan section)  Progress towards OT goals: Progressing toward goals  Acute Rehab OT Goals Patient Stated Goal: to go home OT Goal Formulation: With family Time For Goal Achievement: 08/03/21 Potential to Achieve Goals: [place] Discharge plan remains appropriate;Frequency remains appropriate       AM-PAC OT "6 Clicks" Daily Activity     Outcome Measure   Help from another person eating meals?: Total Help from another person taking care of personal grooming?: A Lot Help from another person toileting, which includes using toliet, bedpan, or urinal?: Total Help from another person bathing (including washing, rinsing, drying)?: Total Help from another person to put on and taking off regular upper body clothing?: A Lot Help from another person to put on and taking off regular lower body clothing?: A Lot 6 Click Score: 9    End of Session Equipment Utilized During Treatment: Oxygen  OT Visit Diagnosis: Muscle weakness (generalized) (M62.81)   Activity Tolerance Patient tolerated treatment well   Patient Left in bed;with call bell/phone within reach;with bed alarm set   Nurse Communication Mobility status        Time: 4680-3212 OT Time Calculation (min): 26  min  Charges: OT General Charges $OT Visit: 1 Visit OT Treatments $Self Care/Home Management : 8-22 mins $Therapeutic Activity: 8-22 mins  Fredirick Maudlin, OTR/L [place]

## 2021-07-31 NOTE — Progress Notes (Signed)
PROGRESS NOTE    [name]  TKW:[id number] DOB: [birth year] DOA: 06/27/2021 PCP: [name], PA-C   Chief complaint.  Shortness of breath. Brief Narrative:   Heather Lopez is a 75 y.o. female with history of hypertension, diabetes mellitus, hyperlipidemia has been feeling weak for the last 3 to 4 days.  Patient had a acute renal failure with creatinine of 9.3, potassium 7.5, acute metabolic acidosis with pH 7.15, procalcitonin 6.83.  Patient was diagnosed was septic shock, acute respiratory failure. On night of 8/4, patient developed acute abdominal pain, CT scan showed acute abdomen secondary to perforated duodenum.  Patient was placed on mechanical ventilation after duodenum repair. Patient had a bilateral hydronephrosis, ureteral stents was placed on 8/10. Patient urine culture was positive for Candida glabrata.  ID consult was obtained, patient currently on high-dose of Diflucan. 8/26. Due to persistent fever, CT scan of abdomen/pelvis was repeated on 8/26, aspiration pararenal fluids was obtained, removed 5 mL of pus.  ID changed antibiotic to Amphotericin in addition to Zosyn  Dr. Kurtis Bushman 's week 8/31-9/5 Patient's hemoglobin was Hg 6.9.,  Was transfused.  Hemoglobin has been on the low side but stable.  Did not need any further transfusion.  Has been having febrile episodes.  Ultrasound of her renal shows same abscess of the right perirenal without any change.  CT of abdomen pelvis without contrast was obtained and revealed the same.  IR plans to drain this.  Once IR drains this they will place PEG tube.  Currently on tube feeding. Plan for IR procedure in a.m.   Assessment & Plan:   Active Problems:   Uncontrolled type 2 diabetes mellitus with insulin therapy ([place])   Hyperlipidemia associated with type 2 diabetes mellitus ([place])   Type 2 diabetes mellitus with stage 4 chronic kidney disease, with long-term current use of insulin (HCC)   Acute respiratory failure with hypoxia (H[facility])    Severe sepsis with septic shock (H[facility])   Acute lower UTI   Acute kidney injury superimposed on chronic kidney disease (HCC)   Pneumoperitoneum   Pressure injury of skin   Atrial flutter with rapid ventricular response (HCC)   Endotracheally intubated   Bilateral nephrolithiasis   Anemia of chronic disease   Demand ischemia of myocardium (HCC)   Lethargy   Duodenal ulcer perforation ([place])   Renal abscess   Essential hypertension   Hypokalemia  Acute metabolic encephalopathy. Sepsis with septic shock secondary to obstructive uropathy, kidney stone.  Status post bilateral ureteral stents. Candida urinary tract infection Perirenal abscess. Perforated duodenum. S/p repair. Pararenal drain by IR only removed 5 mL, cultures so far has no growth.  Repeat ultrasound still has significant pararenal abscess. This thought to be secondary to fungal infection with Candida glabrata.  Patient is on Amphotericin per ID. Patient also s/p bilateral ureteral stents.  Last CT scan showed persistent bilateral hydronephrosis, urology reconsulted, believe this is due to urinary retention.  Foley catheter was anchored. As for patient duodenal perforation, patient had surgical repair, condition had 9/3IR consulted for repositioning of drainage of left perinephric abscess (abscess drained on August 26) they recommended CT renal without contrast first Urology input was appreciated. Bilateral hydro and retention.  Status post stenting with no residual hydro and good urine output per Foley 9/4 ct ab/pelv completed found with unchanged fluid collection of Lt pararenal 10.1 cm. 9/5 MS improved. ID following, plan for IR to remove the old drain tomorrow with placement of a new drain. Per ID  please contact her before initiating any antibiotics as she is on Amphotericin and we do not want to give other nephrotoxic drugs like vancomycin unnecessarily. PEG placement after the new drainage placed.  To follow-up on IR after  abscess drained placed to see when exactly good time to place PEG tube    Fever - Checks x-ray cannot rule out pna Likely from perirenal abscess see above ID following-hold off on initiating any other antibiotics since already on Amphotericin.  Discussed with ID 1 blood culture with staph epi likely contaminant Repeat cultures pending      Acute hypoxemic respiratory failure secondary to septic shock. Morbid obesity and obesity hypoventilation syndrome  Patient has persistent hypoxemia, she had increased oxygen requirement yesterday, she was using 90% of heated high flow.  She was started on IV Lasix, oxygen was down to 40%.  She most likely has acute on chronic diastolic congestive heart failure.  Her echo on 06/27/2021 showed ejection fraction 50 to 55%. 9/5 still on high flow nasal cannula Continue Lasix    Acute kidney injury on chronic kidney disease stage IV. Hypokalemia. Patient is followed by nephrology, initially dialyzed, she has not been requiring hemodialysis for the last few days. 9/5 renal function improving slowly On IV Lasix nephrology following no need for HD at this time         Iron deficiency anemia Likely acute blood loss anemia. Possible GI bleed. Patient has been transfused, also received IV iron.  No B12 deficiency.  She has  black stool from the rectal tube.  GI consult is obtained.  Continued on Protonix IV twice a day. GI is not considering EGD due to high risk of procedure and  patient has severe hypoxemia. 8/31 will transfuse 1 unit prbc today 9/5 h/h stable,transfuse if 7 or less. Monitor cbc periodically   Uncontrolled type 2 diabetes with hyperglycemia BG stable Continue RISS   Dysphagia. Patient has significant dysphagia, has not been able to take any p.o.  Currently receiving tube feeding through NG tube.  Discussed with GI, family still request a PEG tube placement.  However, due to hypoxemia, the procedure was deemed to be high  risk. PEG tube placement after the second drainage for abscess place.  Please follow-up with IR on this  DVT prophylaxis: Heparin Code Status: DNR Family Communication: Family at bedside Disposition Plan:    Status is: Inpatient  Remains inpatient appropriate because:IV treatments appropriate due to intensity of illness or inability to take PO and Inpatient level of care appropriate due to severity of illness  Dispo: The patient is from: Home              Anticipated d/c is to: LTAC              Patient currently is not medically stable to d/c.   Difficult to place patient No  Patient has been accepted to [facility] in [place].  Getting transfusion.      I/O last 3 completed shifts: In: 3976.1 [I.V.:1113.7; Other:50; NG/GT:1312; IV Piggyback:1500.4] Out: 5430 [Urine:5355; Stool:75] Total I/O In: 2694 [NG/GT:960; IV Piggyback:500] Out: 6 [Urine:350]     Consultants:  Nephrology, ID  Procedures: Duodenum, bilateral ureteral stents.  Antimicrobials:  Amphotericin   Subjective: Still complains of being bored.  Awake and interactive with family and me.  Denies abdominal pain chest pain or worsening shortness of breath    Vitals:   07/31/21 0600 07/31/21 0700 07/31/21 0800 07/31/21 0810  BP: (!) 154/60  136/66  Pulse: 94 100 96   Resp: 20 (!) 22 20   Temp:      TempSrc:      SpO2: 97% 93% 99% 97%  Weight:      Height:        Intake/Output Summary (Last 24 hours) at 07/31/2021 0916 Last data filed at 07/31/2021 0800 Gross per 24 hour  Intake 3728.18 ml  Output 3530 ml  Net 198.18 ml   Filed Weights   07/27/21 0325 07/29/21 0500 07/30/21 0500  Weight: (!) 151 kg (!) 148 kg (!) 149.5 kg    Examination: Calm, NAD interactive Anteriorly decreased breath sounds no wheezing Regular S1-S2 no gallops Distended positive bowel sounds soft Positive edema bilaterally aaxox3  Data Reviewed: I have personally reviewed following labs and imaging  studies  CBC: Recent Labs  Lab 07/25/21 0616 07/26/21 0618 07/27/21 0950 07/28/21 0602 07/30/21 1227  WBC 14.9* 14.4* 13.3* 11.2* 9.6  NEUTROABS 12.2* 11.9*  --   --   --   HGB 8.1* 6.9* 8.2* 7.8* 7.8*  HCT 24.6* 21.4* 25.2* 25.0* 24.1*  MCV 87.5 89.9 90.0 89.3 92.3  PLT 252 247 321 355 683*   Basic Metabolic Panel: Recent Labs  Lab 07/25/21 0616 07/26/21 0618 07/27/21 0649 07/28/21 0602 07/29/21 0615 07/30/21 0626 07/31/21 0459  NA 138 143 142 144 146*  --   --   K 3.4* 3.3* 3.3* 3.5 3.3* 3.8  --   CL 101 106 105 107 108  --   --   CO2 22 24 23 28 25   --   --   GLUCOSE 156* 114* 148* 120* 216*  --   --   BUN 70* 78* 85* 88* 87*  --   --   CREATININE 2.88* 2.64* 2.38* 2.26* 2.26*  --   --   CALCIUM 8.9 9.0 9.1 9.1 9.3  --   --   MG 2.0 2.1  --  2.0 2.1 2.2 2.2  PHOS  --  5.0*  --  4.5 3.9 4.3 3.7   GFR: Estimated Creatinine Clearance: 31 mL/min (A) (by C-G formula based on SCr of 2.26 mg/dL (H)). Liver Function Tests: Recent Labs  Lab 07/28/21 0602  AST 19  ALT 31  ALKPHOS 98  BILITOT 0.7  PROT 6.7  ALBUMIN 2.0*   No results for input(s): LIPASE, AMYLASE in the last 168 hours. Recent Labs  Lab 07/28/21 0602  AMMONIA 11   Coagulation Profile: No results for input(s): INR, PROTIME in the last 168 hours. Cardiac Enzymes: No results for input(s): CKTOTAL, CKMB, CKMBINDEX, TROPONINI in the last 168 hours. BNP (last 3 results) No results for input(s): PROBNP in the last 8760 hours. HbA1C: No results for input(s): HGBA1C in the last 72 hours. CBG: Recent Labs  Lab 07/30/21 1558 07/30/21 1942 07/30/21 2357 07/31/21 0334 07/31/21 0812  GLUCAP 184* 181* 168* 143* 126*   Lipid Profile: No results for input(s): CHOL, HDL, LDLCALC, TRIG, CHOLHDL, LDLDIRECT in the last 72 hours. Thyroid Function Tests: No results for input(s): TSH, T4TOTAL, FREET4, T3FREE, THYROIDAB in the last 72 hours. Anemia Panel: No results for input(s): VITAMINB12, FOLATE,  FERRITIN, TIBC, IRON, RETICCTPCT in the last 72 hours. Sepsis Labs: No results for input(s): PROCALCITON, LATICACIDVEN in the last 168 hours.  Recent Results (from the past 240 hour(s))  Body fluid culture w Gram Stain     Status: None (Preliminary result)   Collection Time: 07/21/21  1:23 PM   Specimen: Peritoneal Washings; Body Fluid  Result Value Ref Range Status   Specimen Description   Final    PERITONEAL Performed at [facility], [address] [place], [place] [address]    Special Requests   Final    NONE Performed at [facility], [address] [place], [place] [address]    Gram Stain   Final    MODERATE WBC PRESENT,BOTH PMN AND MONONUCLEAR NO ORGANISMS SEEN    Culture   Final    RARE CANDIDA GLABRATA Sent to [facility] for further susceptibility testing. Performed at [facility], [place] [address] [place], [place] [address]    Report Status PENDING  Incomplete  Antifungal AST 9 Drug Panel     Status: None   Collection Time: 07/21/21  1:23 PM  Result Value Ref Range Status   Organism ID, Yeast Candida glabrata  Corrected    Comment: (NOTE) Identification performed by account, not confirmed by this laboratory. CORRECTED ON 09/04 AT 1435: PREVIOUSLY REPORTED AS Preliminary report    Amphotericin B MIC 0.5 ug/mL  Final    Comment: (NOTE) Breakpoints have been established for only some organism-drug combinations as indicated. This test was developed and its performance characteristics determined by [facility]. It has not been cleared or approved by the Food and Drug Administration.    Anidulafungin MIC Comment  Final    Comment: (NOTE) 0.03 ug/mL Susceptible Breakpoints have been established for only some organism-drug combinations as indicated. This test was developed and its performance characteristics determined by [facility]. It has not been cleared or approved by the Food and Drug Administration.    Caspofungin MIC Comment  Final     Comment: (NOTE) 0.06 ug/mL Susceptible Breakpoints have been established for only some organism-drug combinations as indicated. This test was developed and its performance characteristics determined by [facility]. It has not been cleared or approved by the Food and Drug Administration.    Micafungin MIC Comment  Final    Comment: (NOTE) 0.016 ug/mL Susceptible Breakpoints have been established for only some organism-drug combinations as indicated. This test was developed and its performance characteristics determined by [facility]. It has not been cleared or approved by the Food and Drug Administration.    Posaconazole MIC 0.25 ug/mL  Final    Comment: (NOTE) Breakpoints have been established for only some organism-drug combinations as indicated. This test was developed and its performance characteristics determined by [facility]. It has not been cleared or approved by the Food and Drug Administration.    Fluconazole Islt MIC 4.0 ug/mL  Final    Comment: (NOTE) Susceptible Dose Dependent Breakpoints have been established for only some organism-drug combinations as indicated. This test was developed and its performance characteristics determined by [facility]. It has not been cleared or approved by the Food and Drug Administration.    Flucytosine MIC 0.06 ug/mL or less  Final    Comment: (NOTE) Breakpoints have been established for only some organism-drug combinations as indicated. This test was developed and its performance characteristics determined by [facility]. It has not been cleared or approved by the Food and Drug Administration.    Itraconazole MIC 0.25 ug/mL  Final    Comment: (NOTE) Breakpoints have been established for only some organism-drug combinations as indicated. This test was developed and its performance characteristics determined by [facility]. It has not been cleared or approved by the Food and Drug Administration.    Voriconazole MIC 0.06 ug/mL  Final     Comment: (NOTE) Breakpoints have been established for only some  organism-drug combinations as indicated. This test was developed and its performance characteristics determined by [facility]. It has not been cleared or approved by the Food and Drug Administration. Performed At: [facility] [place], [place] [id number] Rush Farmer MD [id number]    Source CANDIDA GLABRATA/ PERITONEAL FLUID  Final    Comment: Performed at [facility], [place] [address] [place], [place] [address]  Aerobic/Anaerobic Culture w Gram Stain (surgical/deep wound)     Status: None   Collection Time: 07/21/21  3:32 PM   Specimen: Abdomen  Result Value Ref Range Status   Specimen Description   Final    ABDOMEN Performed at [facility], [address] [place], [place] [address]    Special Requests   Final    NONE Performed at [facility], [address] [place], [place] [address]    Gram Stain   Final    MODERATE SQUAMOUS EPITHELIAL CELLS PRESENT WBC PRESENT, PREDOMINANTLY PMN NO ORGANISMS SEEN    Culture   Final    RARE CANDIDA GLABRATA NO ANAEROBES ISOLATED Performed at [facility], [place] [address] [place], [place] [address]    Report Status 07/26/2021 FINAL  Final  CULTURE, BLOOD (ROUTINE X 2) w Reflex to ID Panel     Status: Abnormal   Collection Time: 07/27/21  4:55 PM   Specimen: BLOOD  Result Value Ref Range Status   Specimen Description   Final    BLOOD BLOOD LEFT HAND Performed at [facility], [address], [place], [place] [address]    Special Requests   Final    BOTTLES DRAWN AEROBIC AND ANAEROBIC Blood Culture results may not be optimal due to an inadequate volume of blood received in culture bottles Performed at [facility], [address] [place], [place] [address]    Culture  Setup Time   Final    Organism ID to follow [place] CRITICAL RESULT CALLED TO, READ BACK BY  AND VERIFIED WITH: [place] Performed at [facility], [address] [place], [place] [address]    Culture (A)  Final    STAPHYLOCOCCUS EPIDERMIDIS THE SIGNIFICANCE OF ISOLATING THIS ORGANISM FROM A SINGLE SET OF BLOOD CULTURES WHEN MULTIPLE SETS ARE DRAWN IS UNCERTAIN. PLEASE NOTIFY THE MICROBIOLOGY DEPARTMENT WITHIN ONE WEEK IF SPECIATION AND SENSITIVITIES ARE REQUIRED. Performed at [facility], [place] [address], [place], [place] [address]    Report Status 07/31/2021 FINAL  Final  Blood Culture ID Panel (Reflexed)     Status: Abnormal   Collection Time: 07/27/21  4:55 PM  Result Value Ref Range Status   Enterococcus faecalis NOT DETECTED NOT DETECTED Final   Enterococcus Faecium NOT DETECTED NOT DETECTED Final   Listeria monocytogenes NOT DETECTED NOT DETECTED Final   Staphylococcus species DETECTED (A) NOT DETECTED Final    Comment: CRITICAL RESULT CALLED TO, READ BACK BY AND VERIFIED WITH: SUZANE WATSON AT 1940 ON 07/28/21 BY SS    Staphylococcus aureus (BCID) NOT DETECTED NOT DETECTED Final   Staphylococcus epidermidis DETECTED (A) NOT DETECTED Final    Comment: Methicillin (oxacillin) resistant coagulase negative staphylococcus. Possible blood culture contaminant (unless isolated from more than one blood culture draw or clinical case suggests pathogenicity). No antibiotic treatment is indicated for blood  culture contaminants. CRITICAL RESULT CALLED TO, READ BACK BY AND VERIFIED WITH: [place] ON 07/28/21 BY SS  Staphylococcus lugdunensis NOT DETECTED NOT DETECTED Final   Streptococcus species NOT DETECTED NOT DETECTED Final   Streptococcus agalactiae NOT DETECTED NOT DETECTED Final   Streptococcus pneumoniae NOT DETECTED NOT DETECTED Final   Streptococcus pyogenes NOT DETECTED NOT DETECTED Final   A.calcoaceticus-baumannii NOT DETECTED NOT DETECTED Final   Bacteroides fragilis NOT DETECTED NOT DETECTED Final   Enterobacterales  NOT DETECTED NOT DETECTED Final   Enterobacter cloacae complex NOT DETECTED NOT DETECTED Final   Escherichia coli NOT DETECTED NOT DETECTED Final   Klebsiella aerogenes NOT DETECTED NOT DETECTED Final   Klebsiella oxytoca NOT DETECTED NOT DETECTED Final   Klebsiella pneumoniae NOT DETECTED NOT DETECTED Final   Proteus species NOT DETECTED NOT DETECTED Final   Salmonella species NOT DETECTED NOT DETECTED Final   Serratia marcescens NOT DETECTED NOT DETECTED Final   Haemophilus influenzae NOT DETECTED NOT DETECTED Final   Neisseria meningitidis NOT DETECTED NOT DETECTED Final   Pseudomonas aeruginosa NOT DETECTED NOT DETECTED Final   Stenotrophomonas maltophilia NOT DETECTED NOT DETECTED Final   Candida albicans NOT DETECTED NOT DETECTED Final   Candida auris NOT DETECTED NOT DETECTED Final   Candida glabrata NOT DETECTED NOT DETECTED Final   Candida krusei NOT DETECTED NOT DETECTED Final   Candida parapsilosis NOT DETECTED NOT DETECTED Final   Candida tropicalis NOT DETECTED NOT DETECTED Final   Cryptococcus neoformans/gattii NOT DETECTED NOT DETECTED Final   Methicillin resistance mecA/C DETECTED (A) NOT DETECTED Final    Comment: CRITICAL RESULT CALLED TO, READ BACK BY AND VERIFIED WITH: [place] ON 07/28/21 BY SS Performed at [facility], [address] [place], [place] [address]   CULTURE, BLOOD (ROUTINE X 2) w Reflex to ID Panel     Status: None (Preliminary result)   Collection Time: 07/27/21  4:56 PM   Specimen: BLOOD  Result Value Ref Range Status   Specimen Description BLOOD BLOOD RIGHT HAND  Final   Special Requests   Final    BOTTLES DRAWN AEROBIC AND ANAEROBIC Blood Culture adequate volume   Culture   Final    NO GROWTH 2 DAYS Performed at [facility], [address] [place], [place] [address]    Report Status PENDING  Incomplete  CULTURE, BLOOD (ROUTINE X 2) w Reflex to ID Panel     Status: None (Preliminary result)   Collection  Time: 07/28/21  8:59 PM   Specimen: BLOOD  Result Value Ref Range Status   Specimen Description BLOOD BLOOD RIGHT HAND  Final   Special Requests   Final    BOTTLES DRAWN AEROBIC AND ANAEROBIC Blood Culture results may not be optimal due to an inadequate volume of blood received in culture bottles   Culture   Final    NO GROWTH < 12 HOURS Performed at [facility], [address] [place], [place] [address]    Report Status PENDING  Incomplete         Radiology Studies: CT ABDOMEN PELVIS WO CONTRAST  Result Date: 07/29/2021 CLINICAL DATA:  Pararenal abscess status post percutaneous drainage with persistent fevers. EXAM: CT ABDOMEN AND PELVIS WITHOUT CONTRAST TECHNIQUE: Multidetector CT imaging of the abdomen and pelvis was performed following the standard protocol without IV contrast. COMPARISON:  07/20/2021, drainage procedure 07/21/2021 FINDINGS: Lower chest: Small left pleural effusion is present with subtotal collapse of the left lower lobe, progressive since prior examination. Mild right basilar atelectasis present. Central venous catheter tip noted within the right atrium. Cardiac size within  normal limits. No pericardial effusion. Hepatobiliary: No focal liver abnormality is seen. No gallstones, gallbladder wall thickening, or biliary dilatation. Pancreas: Unremarkable Spleen: Unremarkable Adrenals/Urinary Tract: The adrenal glands are unremarkable. The kidneys are normal in size and position. Nonobstructing caliceal calculi are again identified within the lower pole of the right kidney measuring up to 10 mm and within the left kidney measuring up to 23 mm within the upper pole, unchanged from prior examination. Medullary calcification, possibly post infectious or posttraumatic is again noted within the upper pole of the left kidney. Bilateral ureteral stents are in expected position and previously noted hydronephrosis has resolved on the left and nearly resolved on the right.  Foley catheter balloon is seen within a decompressed bladder lumen. Since the prior examination, a percutaneous drainage catheter passes between the ninth and tenth ribs laterally and appears looped within the anterior pararenal space adjacent to the upper pole of the left kidney with complete evacuation of the previously noted pararenal fluid collection in this location. However, the separate curvilinear fluid collection within the anterior pararenal space measuring 10.1 x 2.2 cm by roughly 9.3 cm in craniocaudal dimension is unchanged. This does not appear to communicate with the percutaneous drainage catheter. Stomach/Bowel: Nasogastric tube extends into the mid to distal body of the stomach. Rectal catheter balloon noted within the a rectal vault. Mild sigmoid diverticulosis. The stomach, small bowel, and large bowel are otherwise unremarkable. Appendix normal. No free intraperitoneal gas or fluid. Vascular/Lymphatic: Mild aortoiliac atherosclerotic calcification. No aortic aneurysm. No pathologic adenopathy within the abdomen and pelvis. Reproductive: Heterogeneous enhancement of the uterine corpus may relate to multiple underlying small uterine fibroids. The pelvic organs are otherwise unremarkable. Other: Dermal thickening involving the a pannus appears stable since prior examination and may be post inflammatory or postsurgical in nature, but is nonspecific. Tiny fat containing umbilical hernia. Musculoskeletal: No acute bone abnormality. Degenerative changes are seen within the lumbar spine. Advanced degenerative changes are seen within the hips bilaterally, right greater than left. No lytic or blastic bone lesion. IMPRESSION: Interval percutaneous drainage of the a perirenal fluid collection adjacent to the upper pole of the left kidney with complete evacuation of this collection. Note that the catheter extends through the left ninth intercostal space. Interval development of a small dependent left pleural  effusion. Persistent unchanged curvilinear fluid collection within the left anterior pararenal space measuring up to 10.1 cm in greatest dimension. This does not appear to communicate with the percutaneous drainage catheter placed within the perirenal collection. Stable bilateral nonobstructing nephrolithiasis. No urolithiasis. Bilateral double-J ureteral stents are in expected position with interval near complete resolution of hydronephrosis. Interval Foley decompression of the bladder. Aortic Atherosclerosis (ICD10-I70.0). Electronically Signed   By: Fidela Salisbury M.D.   On: 07/29/2021 21:15        Scheduled Meds:  sodium chloride   Intravenous Once   sodium chloride   Intravenous Once   acetaminophen (TYLENOL) oral liquid 160 mg/5 mL  650 mg Per Tube Q24H   amLODipine  10 mg Per Tube Daily   chlorhexidine  15 mL Mouth Rinse BID   Chlorhexidine Gluconate Cloth  6 each Topical Q0600   cloNIDine  0.1 mg Per Tube Daily   collagenase   Topical Daily   dextrose  10 mL Intravenous Q24H   dextrose  10 mL Intravenous Q24H   epoetin (EPOGEN/PROCRIT) injection  20,000 Units Subcutaneous Weekly   free water  125 mL Per Tube Q4H   furosemide  80 mg  Intravenous Q12H   haloperidol lactate  1 mg Intravenous Once   heparin injection (subcutaneous)  5,000 Units Subcutaneous Q8H   insulin aspart  0-20 Units Subcutaneous Q4H   insulin aspart  6 Units Subcutaneous Q4H   insulin detemir  30 Units Subcutaneous BID   mouth rinse  15 mL Mouth Rinse q12n4p   metoprolol tartrate  100 mg Per Tube BID   multivitamin  1 tablet Per Tube QHS   nutrition supplement (JUVEN)  1 packet Per Tube BID BM   pantoprazole (PROTONIX) IV  40 mg Intravenous Q12H   potassium chloride  40 mEq Per Tube Daily   sodium chloride  500 mL Intravenous Q24H   sodium chloride  500 mL Intravenous Q24H   sodium chloride flush  5 mL Intracatheter Q8H   Continuous Infusions:  sodium chloride Stopped (07/30/21 2024)   amphotericin B   Conventional (FUNGIZONE) IV 125 mL/hr at 07/31/21 0233   feeding supplement (GLUCERNA 1.5 CAL) 60 mL/hr at 07/31/21 0800   feeding supplement (GLUCERNA 1.5 CAL) 1,000 mL (07/30/21 1208)     LOS: 34 days    Time spent: 35 minutes with >50% on coc    Nolberto Hanlon, MD [facility]   To contact the attending provider between 7A-7P or the covering provider during after hours 7P-7A, please log into the web site [email or web address] and access using universal [place] password for that web site. If you do not have the password, please call the hospital operator.  07/31/2021, 9:16 AM

## 2021-08-01 ENCOUNTER — Inpatient Hospital Stay: Payer: Medicare Other | Admitting: Radiology

## 2021-08-01 ENCOUNTER — Other Ambulatory Visit: Payer: Self-pay

## 2021-08-01 ENCOUNTER — Inpatient Hospital Stay: Payer: Medicare Other

## 2021-08-01 HISTORY — PX: IR GASTROSTOMY TUBE MOD SED: IMG625

## 2021-08-01 LAB — GLUCOSE, CAPILLARY
Glucose-Capillary: 103 mg/dL — ABNORMAL HIGH (ref 70–99)
Glucose-Capillary: 139 mg/dL — ABNORMAL HIGH (ref 70–99)
Glucose-Capillary: 144 mg/dL — ABNORMAL HIGH (ref 70–99)
Glucose-Capillary: 159 mg/dL — ABNORMAL HIGH (ref 70–99)
Glucose-Capillary: 160 mg/dL — ABNORMAL HIGH (ref 70–99)
Glucose-Capillary: 181 mg/dL — ABNORMAL HIGH (ref 70–99)
Glucose-Capillary: 190 mg/dL — ABNORMAL HIGH (ref 70–99)
Glucose-Capillary: 57 mg/dL — ABNORMAL LOW (ref 70–99)

## 2021-08-01 LAB — CULTURE, BLOOD (ROUTINE X 2)
Culture: NO GROWTH
Special Requests: ADEQUATE

## 2021-08-01 LAB — PHOSPHORUS: Phosphorus: 4.3 mg/dL (ref 2.5–4.6)

## 2021-08-01 LAB — POTASSIUM: Potassium: 3.8 mmol/L (ref 3.5–5.1)

## 2021-08-01 LAB — MAGNESIUM: Magnesium: 2.4 mg/dL (ref 1.7–2.4)

## 2021-08-01 MED ORDER — DEXTROSE 50 % IV SOLN
INTRAVENOUS | Status: AC
  Administered 2021-08-01: 50 mL via INTRAVENOUS
  Filled 2021-08-01: qty 50

## 2021-08-01 MED ORDER — LIDOCAINE HCL 1 % IJ SOLN
INTRAMUSCULAR | Status: AC
  Administered 2021-08-01: 20 mL
  Filled 2021-08-01: qty 20

## 2021-08-01 MED ORDER — GLUCAGON HCL RDNA (DIAGNOSTIC) 1 MG IJ SOLR
INTRAMUSCULAR | Status: AC
  Filled 2021-08-01: qty 1

## 2021-08-01 MED ORDER — GADOBUTROL 1 MMOL/ML IV SOLN
30.0000 mL | Freq: Once | INTRAVENOUS | Status: AC | PRN
  Administered 2021-08-01: 30 mL

## 2021-08-01 MED ORDER — MIDAZOLAM HCL 2 MG/2ML IJ SOLN
INTRAMUSCULAR | Status: AC
  Filled 2021-08-01: qty 2

## 2021-08-01 MED ORDER — ONDANSETRON HCL 4 MG/2ML IJ SOLN
INTRAMUSCULAR | Status: AC
  Filled 2021-08-01: qty 2

## 2021-08-01 MED ORDER — FENTANYL CITRATE (PF) 100 MCG/2ML IJ SOLN
INTRAMUSCULAR | Status: AC | PRN
  Administered 2021-08-01: 12.5 ug via INTRAVENOUS

## 2021-08-01 MED ORDER — DEXTROSE 10 % IV SOLN: INTRAVENOUS | Status: DC

## 2021-08-01 MED ORDER — FENTANYL CITRATE (PF) 100 MCG/2ML IJ SOLN
INTRAMUSCULAR | Status: AC
  Filled 2021-08-01: qty 2

## 2021-08-01 MED ORDER — SODIUM CHLORIDE 0.9% FLUSH
10.0000 mL | Freq: Two times a day (BID) | INTRAVENOUS | Status: DC
  Administered 2021-08-01 – 2021-08-03 (×4): 10 mL

## 2021-08-01 MED ORDER — GLUCAGON HCL RDNA (DIAGNOSTIC) 1 MG IJ SOLR
INTRAMUSCULAR | Status: AC | PRN
  Administered 2021-08-01: .5 mg via INTRAVENOUS

## 2021-08-01 MED ORDER — DEXTROSE 50 % IV SOLN: 1.0000 | Freq: Once | INTRAVENOUS | Status: AC

## 2021-08-01 MED ORDER — FENTANYL CITRATE (PF) 100 MCG/2ML IJ SOLN
INTRAMUSCULAR | Status: AC | PRN
  Administered 2021-08-01: 25 ug via INTRAVENOUS
  Administered 2021-08-01: 12.5 ug via INTRAVENOUS

## 2021-08-01 NOTE — Sedation Documentation (Signed)
Report called to Ronalee Belts, RN ICU. Pt. Tolerated procedure well. Fentanyl 37.5 mcg IV total for this procedure.

## 2021-08-01 NOTE — Consult Note (Signed)
PHARMACY CONSULT NOTE - FOLLOW UP  Pharmacy Consult for Electrolyte Monitoring and Replacement   Recent Labs: Potassium (mmol/L)  Date Value  08/01/2021 3.8  01/29/2014 4.0   Magnesium (mg/dL)  Date Value  08/01/2021 2.4  01/29/2014 1.8   Calcium (mg/dL)  Date Value  07/31/2021 9.6   Calcium, Total (mg/dL)  Date Value  01/29/2014 9.7   Albumin (g/dL)  Date Value  07/31/2021 2.0 (L)  07/19/2015 4.5  01/27/2014 4.1   Phosphorus (mg/dL)  Date Value  08/01/2021 4.3   Sodium (mmol/L)  Date Value  07/31/2021 145  07/19/2015 142  01/29/2014 133 (L)     Assessment: 75yo female w/ h/o HTN, DM, HLD, & MDD presenting with weakness/AKI/sepsis with growth of Candida glabrata pyelonephritis from 8/17 Ucx. Nephrology consulted. Pharmacy consulted to manage PRN mgmt of electrolytes -on amphotericin(can cause hypo/hyperkalemia, hypomagnesemia)  Goal of Therapy:  [place] WNL  Plan:   --Remains on Lasix 80mg  IV q12h, KCL 40 meq PO daily --No replacement needed at this time  --Continue to follow along  Tawnya Crook, PharmD, BCPS Clinical Pharmacist 08/01/2021 1:49 PM

## 2021-08-01 NOTE — Progress Notes (Signed)
PROGRESS NOTE    Heather Lopez  RWE:[id number] DOB: 18-Mar-1946 DOA: 06/27/2021 PCP: [name], PA-C    Brief Narrative:  Heather Lopez is a 75 y.o. female with history of hypertension, diabetes mellitus, hyperlipidemia has been feeling weak for the last 3 to 4 days.  Patient had a acute renal failure with creatinine of 9.3, potassium 7.5, acute metabolic acidosis with pH 7.15, procalcitonin 6.83.  Patient was diagnosed was septic shock, acute respiratory failure. On night of 8/4, patient developed acute abdominal pain, CT scan showed acute abdomen secondary to perforated duodenum.  Patient was placed on mechanical ventilation after duodenum repair. Patient had a bilateral hydronephrosis, ureteral stents was placed on 8/10. Patient urine culture was positive for Candida glabrata.  ID consult was obtained, patient currently on high-dose of Diflucan. 8/26. Due to persistent fever, CT scan of abdomen/pelvis was repeated on 8/26, aspiration pararenal fluids was obtained, removed 5 mL of pus.  ID changed antibiotic to Amphotericin in addition to Zosyn Patient had an left perirenal abscess drain placed on 9/6, PEG tube was also placed.  Patient is continued on Amphotericin per ID.  Assessment & Plan:   Active Problems:   Uncontrolled type 2 diabetes mellitus with insulin therapy ([place])   Hyperlipidemia associated with type 2 diabetes mellitus ([place])   Type 2 diabetes mellitus with stage 4 chronic kidney disease, with long-term current use of insulin (HCC)   Acute respiratory failure with hypoxia (HCC)   Severe sepsis with septic shock (HCC)   Acute lower UTI   Acute kidney injury superimposed on chronic kidney disease (HCC)   Pneumoperitoneum   Pressure injury of skin   Atrial flutter with rapid ventricular response (HCC)   Endotracheally intubated   Bilateral nephrolithiasis   Anemia of chronic disease   Demand ischemia of myocardium (HCC)   Lethargy   Duodenal ulcer perforation ([name])    Renal abscess   Essential hypertension   Hypokalemia   Acute metabolic encephalopathy. Sepsis with septic shock secondary to obstructive uropathy, kidney stone.  Status post bilateral ureteral stents. Candida urinary tract infection Perirenal abscess. Perforated duodenum. S/p repair. Patient condition seem to be improving, she is definitely less confused since last time I seen her. She had a left pararenal drain placed yesterday again, PEG tube has been placed.  We will start tube feeding once PEG tube is ready. Bilateral-hydronephrosis has resolved after stents placement.  Intermittent fever. Followed by ID.  No additional antibiotics are needed.  Continue Amphotericin.  Acute hypoxemic respiratory failure secondary to septic shock. Morbid obesity and obesity hypoventilation syndrome  Patient currently on 40% oxygen.   Iron deficiency anemia Likely acute blood loss anemia. Possible GI bleed. More stable, patient has been requiring intermittent blood transfusion.  Uncontrolled type 2 diabetes with hyperglycemia. Continue current regimen.  Dysphagia  Followed by speech therapy, currently on tube feeding.    DVT prophylaxis: Heparin Code Status: DNR Family Communication: Husband at the bedside and updated. Disposition Plan:    Status is: Inpatient  Remains inpatient appropriate because:IV treatments appropriate due to intensity of illness or inability to take PO and Inpatient level of care appropriate due to severity of illness  Dispo: The patient is from: Home              Anticipated d/c is to: LTAC              Patient currently is not medically stable to d/c.   Difficult to place patient No  I/O last 3 completed shifts: In: 33 [I.V.:895.4; Other:50; NG/GT:2870; IV Piggyback:1997.6] Out: 3235 [Urine:5475; Stool:150] Total I/O In: -  Out: 1000 [Urine:1000]     Consultants:  Nephrology, ID  Procedures: Pararenal abscess drained,  PEG  Antimicrobials: Amphotericin. Subjective: Patient condition much improved, she is more awake, she can maintain a conversation.  She is less confused. She is still on 40% oxygen. No abdominal pain or nausea vomiting. No fever in the last 24 hours. No dysuria hematuria.   Objective: Vitals:   08/01/21 1350 08/01/21 1355 08/01/21 1405 08/01/21 1409  BP: 123/65 115/64 116/62 (!) 109/58  Pulse: 97 92 96 94  Resp: 18 18 20  (!) 22  Temp:      TempSrc:      SpO2: 96% 94% 93% 93%  Weight:      Height:        Intake/Output Summary (Last 24 hours) at 08/01/2021 1547 Last data filed at 08/01/2021 1200 Gross per 24 hour  Intake 1617.27 ml  Output 3550 ml  Net -1932.73 ml   Filed Weights   07/29/21 0500 07/30/21 0500 08/01/21 0500  Weight: (!) 148 kg (!) 149.5 kg (!) 156.2 kg    Examination:  General exam: Appears calm and comfortable, morbid obese Respiratory system: Clear to auscultation. Respiratory effort normal. Cardiovascular system: S1 & S2 heard, RRR. No JVD, murmurs, rubs, gallops or clicks. No pedal edema. Gastrointestinal system: Abdomen is nondistended, soft and nontender. No organomegaly or masses felt. Normal bowel sounds heard. Central nervous system: Alert and oriented x2. No focal neurological deficits. Extremities: Symmetric 5 x 5 power. Skin: No rashes, lesions or ulcers Psychiatry:  Mood & affect appropriate.     Data Reviewed: I have personally reviewed following labs and imaging studies  CBC: Recent Labs  Lab 07/26/21 0618 07/27/21 0950 07/28/21 0602 07/30/21 1227  WBC 14.4* 13.3* 11.2* 9.6  NEUTROABS 11.9*  --   --   --   HGB 6.9* 8.2* 7.8* 7.8*  HCT 21.4* 25.2* 25.0* 24.1*  MCV 89.9 90.0 89.3 92.3  PLT 247 321 355 573*   Basic Metabolic Panel: Recent Labs  Lab 07/26/21 0618 07/27/21 0649 07/28/21 0602 07/29/21 0615 07/30/21 0626 07/31/21 0459 08/01/21 0411  NA 143 142 144 146*  --  145  --   K 3.3* 3.3* 3.5 3.3* 3.8 3.9 3.8  CL  106 105 107 108  --  108  --   CO2 24 23 28 25   --  26  --   GLUCOSE 114* 148* 120* 216*  --  136*  --   BUN 78* 85* 88* 87*  --  97*  --   CREATININE 2.64* 2.38* 2.26* 2.26*  --  2.01*  --   CALCIUM 9.0 9.1 9.1 9.3  --  9.6  --   MG 2.1  --  2.0 2.1 2.2 2.2 2.4  PHOS 5.0*  --  4.5 3.9 4.3 3.6  3.7 4.3   GFR: Estimated Creatinine Clearance: 35.9 mL/min (A) (by C-G formula based on SCr of 2.01 mg/dL (H)). Liver Function Tests: Recent Labs  Lab 07/28/21 0602 07/31/21 0459  AST 19  --   ALT 31  --   ALKPHOS 98  --   BILITOT 0.7  --   PROT 6.7  --   ALBUMIN 2.0* 2.0*   No results for input(s): LIPASE, AMYLASE in the last 168 hours. Recent Labs  Lab 07/28/21 0602  AMMONIA 11   Coagulation Profile: No results for input(s):  INR, PROTIME in the last 168 hours. Cardiac Enzymes: No results for input(s): CKTOTAL, CKMB, CKMBINDEX, TROPONINI in the last 168 hours. BNP (last 3 results) No results for input(s): PROBNP in the last 8760 hours. HbA1C: No results for input(s): HGBA1C in the last 72 hours. CBG: Recent Labs  Lab 07/31/21 2227 08/01/21 0040 08/01/21 0411 08/01/21 0717 08/01/21 1115  GLUCAP 170* 190* 144* 139* 160*   Lipid Profile: No results for input(s): CHOL, HDL, LDLCALC, TRIG, CHOLHDL, LDLDIRECT in the last 72 hours. Thyroid Function Tests: No results for input(s): TSH, T4TOTAL, FREET4, T3FREE, THYROIDAB in the last 72 hours. Anemia Panel: No results for input(s): VITAMINB12, FOLATE, FERRITIN, TIBC, IRON, RETICCTPCT in the last 72 hours. Sepsis Labs: No results for input(s): PROCALCITON, LATICACIDVEN in the last 168 hours.  Recent Results (from the past 240 hour(s))  CULTURE, BLOOD (ROUTINE X 2) w Reflex to ID Panel     Status: Abnormal   Collection Time: 07/27/21  4:55 PM   Specimen: BLOOD  Result Value Ref Range Status   Specimen Description   Final    BLOOD BLOOD LEFT HAND Performed at [facility], [address] [place], [place]  [address]    Special Requests   Final    BOTTLES DRAWN AEROBIC AND ANAEROBIC Blood Culture results may not be optimal due to an inadequate volume of blood received in culture bottles Performed at [facility], [address] [place], [place] [address]    Culture  Setup Time   Final    Organism ID to follow [place] TO, READ BACK BY AND VERIFIED WITH: Jeddo Performed at [facility], [address] [place], [place] [address]    Culture (A)  Final    STAPHYLOCOCCUS EPIDERMIDIS THE SIGNIFICANCE OF ISOLATING THIS ORGANISM FROM A SINGLE SET OF BLOOD CULTURES WHEN MULTIPLE SETS ARE DRAWN IS UNCERTAIN. PLEASE NOTIFY THE MICROBIOLOGY DEPARTMENT WITHIN ONE WEEK IF SPECIATION AND SENSITIVITIES ARE REQUIRED. Performed at [facility], [place] [address], [place],  [address]    Report Status 07/31/2021 FINAL  Final  Blood Culture ID Panel (Reflexed)     Status: Abnormal   Collection Time: 07/27/21  4:55 PM  Result Value Ref Range Status   Enterococcus faecalis NOT DETECTED NOT DETECTED Final   Enterococcus Faecium NOT DETECTED NOT DETECTED Final   Listeria monocytogenes NOT DETECTED NOT DETECTED Final   Staphylococcus species DETECTED (A) NOT DETECTED Final    Comment: CRITICAL RESULT CALLED TO, READ BACK BY AND VERIFIED WITH: SUZANE WATSON AT 1940 ON 07/28/21 BY SS    Staphylococcus aureus (BCID) NOT DETECTED NOT DETECTED Final   Staphylococcus epidermidis DETECTED (A) NOT DETECTED Final    Comment: Methicillin (oxacillin) resistant coagulase negative staphylococcus. Possible blood culture contaminant (unless isolated from more than one blood culture draw or clinical case suggests pathogenicity). No antibiotic treatment is indicated for blood  culture contaminants. CRITICAL RESULT CALLED TO, READ BACK BY AND VERIFIED WITH: SUZANE WATSON AT 1940 ON 07/28/21 BY SS    Staphylococcus lugdunensis  NOT DETECTED NOT DETECTED Final   Streptococcus species NOT DETECTED NOT DETECTED Final   Streptococcus agalactiae NOT DETECTED NOT DETECTED Final   Streptococcus pneumoniae NOT DETECTED NOT DETECTED Final   Streptococcus pyogenes NOT DETECTED NOT DETECTED Final   A.calcoaceticus-baumannii NOT DETECTED NOT DETECTED Final   Bacteroides fragilis NOT DETECTED NOT DETECTED Final   Enterobacterales NOT DETECTED NOT  DETECTED Final   Enterobacter cloacae complex NOT DETECTED NOT DETECTED Final   Escherichia coli NOT DETECTED NOT DETECTED Final   Klebsiella aerogenes NOT DETECTED NOT DETECTED Final   Klebsiella oxytoca NOT DETECTED NOT DETECTED Final   Klebsiella pneumoniae NOT DETECTED NOT DETECTED Final   Proteus species NOT DETECTED NOT DETECTED Final   Salmonella species NOT DETECTED NOT DETECTED Final   Serratia marcescens NOT DETECTED NOT DETECTED Final   Haemophilus influenzae NOT DETECTED NOT DETECTED Final   Neisseria meningitidis NOT DETECTED NOT DETECTED Final   Pseudomonas aeruginosa NOT DETECTED NOT DETECTED Final   Stenotrophomonas maltophilia NOT DETECTED NOT DETECTED Final   Candida albicans NOT DETECTED NOT DETECTED Final   Candida auris NOT DETECTED NOT DETECTED Final   Candida glabrata NOT DETECTED NOT DETECTED Final   Candida krusei NOT DETECTED NOT DETECTED Final   Candida parapsilosis NOT DETECTED NOT DETECTED Final   Candida tropicalis NOT DETECTED NOT DETECTED Final   Cryptococcus neoformans/gattii NOT DETECTED NOT DETECTED Final   Methicillin resistance mecA/C DETECTED (A) NOT DETECTED Final    Comment: CRITICAL RESULT CALLED TO, READ BACK BY AND VERIFIED WITH: SUZANE WATSON AT 1940 ON 07/28/21 BY SS Performed at [facility], [address] [place], [place] [address]   CULTURE, BLOOD (ROUTINE X 2) w Reflex to ID Panel     Status: None   Collection Time: 07/27/21  4:56 PM   Specimen: BLOOD  Result Value Ref Range Status   Specimen Description BLOOD BLOOD  RIGHT HAND  Final   Special Requests   Final    BOTTLES DRAWN AEROBIC AND ANAEROBIC Blood Culture adequate volume   Culture   Final    NO GROWTH 5 DAYS Performed at [facility], [address] [place], [place] [address]    Report Status 08/01/2021 FINAL  Final  CULTURE, BLOOD (ROUTINE X 2) w Reflex to ID Panel     Status: None (Preliminary result)   Collection Time: 07/28/21  8:59 PM   Specimen: BLOOD  Result Value Ref Range Status   Specimen Description BLOOD BLOOD RIGHT HAND  Final   Special Requests   Final    BOTTLES DRAWN AEROBIC AND ANAEROBIC Blood Culture results may not be optimal due to an inadequate volume of blood received in culture bottles   Culture  Setup Time   Final    TEST CANCELLED PER MD Dr. Delaine Lame Performed at [facility], [address] [place], [place] [address]    Report Status PENDING  Incomplete         Radiology Studies: IR GASTROSTOMY TUBE MOD SED  Result Date: 08/01/2021 INDICATION: Dysphagia EXAM: PERC PLACEMENT GASTROSTOMY MEDICATIONS: None ANESTHESIA/SEDATION: Versed 0 mg IV; Fentanyl 37.5 mcg IV Moderate Sedation Time:  30 minutes The patient was continuously monitored during the procedure by the interventional radiology nurse under my direct supervision. CONTRAST:  Secondary to patient's reported iodinated contrast allergy, gadolinium contrast was used for the procedure-administered into the gastric lumen. FLUOROSCOPY TIME:  Fluoroscopy Time: 2 minutes 54 seconds (33.5 mGy). COMPLICATIONS: None immediate. PROCEDURE: Informed written consent was obtained from the patient and/or patient's representative after a thorough discussion of the procedural risks, benefits and alternatives. All questions were addressed. Maximal Sterile barrier Technique was utilized including caps, mask, sterile gowns, sterile gloves, sterile drape, hand hygiene and skin antiseptic. A timeout was performed prior to the initiation of the procedure. The  patient was placed on the procedure table in the supine position.  Pre-procedure abdominal film confirmed visualization of the transverse colon. The patient was prepped and draped in usual sterile fashion. The stomach was insufflated with air via the indwelling nasogastric tube. Under fluoroscopy, a puncture site was selected and local analgesia achieved with 1% lidocaine infiltrated subcutaneously. Under fluoroscopic guidance, a gastropexy needle was passed into the stomach and the T-bar suture was released. Entry into the stomach was confirmed with fluoroscopy, aspiration of air, and injection of contrast material. This was repeated with an additional gastropexy suture (for a total of 2 fasteners). At the center of these gastropexy sutures, a dermatotomy was performed. An 18 gauge needle was passed into the stomach at the site of this dermatotomy, and position within the gastric lumen again confirmed under fluoroscopy using aspiration of air and contrast injection. An Amplatz guidewire was passed through this needle and intraluminal placement within the stomach was confirmed by fluoroscopy. The needle was removed. Over the guidewire, a dilator peel-away sheath was introduced and the 18 French gastrostomy tube was placed over wire. The retention balloon of the percutaneous gastrostomy tube was inflated with 10 mL of sterile water. The tube was withdrawn until the retention balloon was at the edge of the gastric lumen. The external bumper was brought to the abdominal wall. Contrast was injected through the gastrostomy tube, confirming intraluminal positioning. The patient tolerated the procedure well without any immediate post-procedural complications. IMPRESSION: Technically successful placement of 18 Fr gastrostomy tube, as above. RECOMMENDATIONS: The patient is to return to interventional radiology for routine gastrostomy tube replacement in 6 months. Michaelle Birks, MD [facility] Interventional Radiology  [facility] Electronically Signed   By: Michaelle Birks M.D.   On: 08/01/2021 14:57   DG Chest Port 1 View  Result Date: 08/01/2021 CLINICAL DATA:  Postprocedure.  Evaluate for pneumothorax. EXAM: PORTABLE CHEST 1 VIEW COMPARISON:  Portable chest radiograph, 07/27/2021. FINDINGS: Support lines: LEFT IJ tunneled dialysis catheter, with tip within the RIGHT atrium. NG tube, with tip excluded from view. Multiple overlying pacer leads. Cardiomediastinal silhouette is unchanged. Hypoinflation with streaky perihilar opacities. LEFT basilar consolidation silhouetting the cardiac apex. No large pleural effusion or pneumothorax. No interval osseous abnormality IMPRESSION: 1. No postprocedure pneumothorax. 2. Lines and tubes as above. 3. Hypoinflation with perihilar and basilar opacities favored to represent interstitial edema with atelectasis, though pneumonia could appear similar. Electronically Signed   By: Michaelle Birks M.D.   On: 08/01/2021 15:27   CT IMAGE GUIDED DRAINAGE BY PERCUTANEOUS CATHETER  Result Date: 08/01/2021 INDICATION: Perirenal abscess. EXAM: CT GUIDED DRAINAGE CATHETER PLACEMENT INTO LEFT PERIANAL ABSCESS COMPARISON:  CT Abdomen Pelvis, 07/29/2021. MEDICATIONS: The patient is currently admitted to the hospital and receiving intravenous antibiotics. The antibiotics were administered within an appropriate time frame prior to the initiation of the procedure. ANESTHESIA/SEDATION: Moderate (conscious) sedation was employed during this procedure. A total of Versed 0 mg and Fentanyl 12.5 mcg was administered intravenously. Moderate Sedation Time: 21 minutes. The patient's level of consciousness and vital signs were monitored continuously by radiology nursing throughout the procedure under my direct supervision. CONTRAST:  None COMPLICATIONS: None immediate. PROCEDURE: Informed written consent was obtained from the patient after a discussion of the risks, benefits and alternatives to  treatment. The patient was placed supine on the CT gantry and a pre procedural CT was performed re-demonstrating the known abscess/fluid collection within the LEFT perirenal space. The procedure was planned. A timeout was performed prior to the initiation of the procedure. The LEFT flank was prepped and  draped in the usual sterile fashion. The overlying soft tissues were anesthetized with 1% lidocaine with epinephrine. Appropriate trajectory was planned with the use of a 22 gauge spinal needle. An 18 gauge trocar needle was advanced into the abscess/fluid collection and a short Amplatz super stiff wire was coiled within the collection. Appropriate positioning was confirmed with a limited CT scan. The tract was serially dilated allowing placement of a 12 [place] all-purpose drainage catheter. Appropriate positioning was confirmed with a limited postprocedural CT scan. 15 mL of purulent fluid was aspirated. The tube was connected to a drainage bag and sutured in place. A dressing was placed. The patient tolerated the procedure well without immediate post procedural complication. IMPRESSION: Successful CT guided placement of a 81 French all purpose drain catheter into the LEFT pararenal abscess with aspiration of 15 mL of purulent fluid. Samples were sent to the laboratory as requested by the ordering clinical team. Michaelle Birks, MD [facility] Electronically Signed   By: Michaelle Birks M.D.   On: 08/01/2021 14:49        Scheduled Meds:  sodium chloride   Intravenous Once   sodium chloride   Intravenous Once   acetaminophen (TYLENOL) oral liquid 160 mg/5 mL  650 mg Per Tube Q24H   amLODipine  10 mg Per Tube Daily   chlorhexidine  15 mL Mouth Rinse BID   Chlorhexidine Gluconate Cloth  6 each Topical Q0600   cloNIDine  0.1 mg Per Tube Daily   collagenase   Topical Daily   dextrose  10 mL Intravenous Q24H   dextrose  10 mL Intravenous Q24H   epoetin  (EPOGEN/PROCRIT) injection  20,000 Units Subcutaneous Weekly   fentaNYL       free water  125 mL Per Tube Q4H   furosemide  80 mg Intravenous Q12H   glucagon (human recombinant)       haloperidol lactate  1 mg Intravenous Once   heparin injection (subcutaneous)  5,000 Units Subcutaneous Q8H   insulin aspart  0-20 Units Subcutaneous Q4H   insulin aspart  6 Units Subcutaneous Q4H   insulin detemir  30 Units Subcutaneous BID   mouth rinse  15 mL Mouth Rinse q12n4p   metoprolol tartrate  100 mg Per Tube BID   multivitamin  1 tablet Per Tube QHS   nutrition supplement (JUVEN)  1 packet Per Tube BID BM   ondansetron       pantoprazole (PROTONIX) IV  40 mg Intravenous Q12H   potassium chloride  40 mEq Per Tube Daily   sodium chloride flush  10 mL Intracatheter Q12H   Continuous Infusions:  sodium chloride Stopped (07/30/21 2024)   amphotericin B  Conventional (FUNGIZONE) IV 125 mL/hr at 07/31/21 2319   feeding supplement (GLUCERNA 1.5 CAL) Stopped (08/01/21 0000)     LOS: 35 days    Time spent: 34 minutes, more than 50% time spent on direct patient care.    Sharen Hones, MD [facility]   To contact the attending provider between 7A-7P or the covering provider during after hours 7P-7A, please log into the web site [email or web address] and access using universal [place] password for that web site. If you do not have the password, please call the hospital operator.  08/01/2021, 3:47 PM

## 2021-08-01 NOTE — Progress Notes (Signed)
Referring Physician(s): Nolberto Hanlon, MD  Supervising Physician: Michaelle Birks  Patient Status:  [facility] - In-pt  Reason for consult: Left peri-renal fluid collection  Subjective: Patient is known to IR, previously consulted for G-tube placement. Husband is bedside and states patient is more alert today however does have some confusion regarding who family members are. Request received for left peri-renal drain removal with new anterior left peri-renal drain placement in fluid collection seen on CT 9/3 with fevers.  Allergies: Contrast media  [iodinated diagnostic agents] and Iodine  Medications: Prior to Admission medications   Medication Sig Start Date End Date Taking? Authorizing Provider  amLODipine (NORVASC) 10 MG tablet Take 1 tablet (10 mg total) by mouth daily. 04/15/18  Yes Mikey College, NP  aspirin EC 81 MG tablet Take 81 mg by mouth daily.   Yes [provider]  atorvastatin (LIPITOR) 20 MG tablet Take 1 tablet (20 mg total) by mouth daily. 04/15/18  Yes Mikey College, NP  benzonatate (TESSALON) 200 MG capsule Take 200 mg by mouth 3 (three) times daily as needed. 06/23/21  Yes [provider]  buPROPion (WELLBUTRIN XL) 150 MG 24 hr tablet Take 1 tablet (150 mg total) by mouth daily. 05/26/18  Yes Mikey College, NP  insulin aspart (NOVOLOG FLEXPEN) 100 UNIT/ML FlexPen Use meal coverage 6 units with every meal plus correction scale three times daily up to total of 35 units per day. 04/15/18  Yes Mikey College, NP  Insulin Pen Needle 32G X 4 MM MISC Inject insulin pens up to 5 times daily 04/15/18  Yes Mikey College, NP  Lancets (FREESTYLE) lancets USE AS INSTRUCTED 06/16/19  Yes Karamalegos, Alexander J, DO  LEVEMIR FLEXTOUCH 100 UNIT/ML Pen INJECT 50 UNITS IN THE MORNING AND 44 UNITS IN THE EVENING Patient taking differently: Inject 52 Units into the skin 2 (two) times daily. 06/30/18  Yes Mikey College, NP   levofloxacin (LEVAQUIN) 500 MG tablet Take 500 mg by mouth daily. 06/23/21  Yes [provider]  losartan (COZAAR) 50 MG tablet Take 1 tablet (50 mg total) by mouth daily. 04/15/18  Yes Mikey College, NP  metFORMIN (GLUCOPHAGE) 1000 MG tablet Take 1 tablet (1,000 mg total) by mouth 2 (two) times daily. 04/15/18  Yes Mikey College, NP  metoprolol tartrate (LOPRESSOR) 25 MG tablet Take 1 tablet (25 mg total) by mouth 2 (two) times daily. 05/26/18  Yes Mikey College, NP  Multiple Vitamin (MULTI-VITAMIN) tablet Take 1 tablet by mouth daily.    Yes [provider]  ondansetron (ZOFRAN-ODT) 4 MG disintegrating tablet Take 4 mg by mouth every 8 (eight) hours as needed. 06/23/21  Yes [provider]  predniSONE (DELTASONE) 20 MG tablet Take 20 mg by mouth 2 (two) times daily. 06/23/21  Yes [provider]  TRULICITY 9.21 JH/4.1DE SOPN SMARTSIG:0.5 Milliliter(s) SUB-Q Once a Week 06/19/21  Yes [provider]   Vital Signs: BP (!) 148/64   Pulse 99   Temp 99.8 F (37.7 C) (Oral)   Resp (!) 22   Ht 5' 2.01" (1.575 m)   Wt (!) 344 lb 5.7 oz (156.2 kg)   SpO2 90%   BMI 62.97 kg/m    Imaging: CT ABDOMEN PELVIS WO CONTRAST  Result Date: 07/29/2021 CLINICAL DATA:  Pararenal abscess status post percutaneous drainage with persistent fevers. EXAM: CT ABDOMEN AND PELVIS WITHOUT CONTRAST TECHNIQUE: Multidetector CT imaging of the abdomen and pelvis was performed following the standard  protocol without IV contrast. COMPARISON:  07/20/2021, drainage procedure 07/21/2021 FINDINGS: Lower chest: Small left pleural effusion is present with subtotal collapse of the left lower lobe, progressive since prior examination. Mild right basilar atelectasis present. Central venous catheter tip noted within the right atrium. Cardiac size within normal limits. No pericardial effusion. Hepatobiliary: No focal liver abnormality is seen. No gallstones, gallbladder wall  thickening, or biliary dilatation. Pancreas: Unremarkable Spleen: Unremarkable Adrenals/Urinary Tract: The adrenal glands are unremarkable. The kidneys are normal in size and position. Nonobstructing caliceal calculi are again identified within the lower pole of the right kidney measuring up to 10 mm and within the left kidney measuring up to 23 mm within the upper pole, unchanged from prior examination. Medullary calcification, possibly post infectious or posttraumatic is again noted within the upper pole of the left kidney. Bilateral ureteral stents are in expected position and previously noted hydronephrosis has resolved on the left and nearly resolved on the right. Foley catheter balloon is seen within a decompressed bladder lumen. Since the prior examination, a percutaneous drainage catheter passes between the ninth and tenth ribs laterally and appears looped within the anterior pararenal space adjacent to the upper pole of the left kidney with complete evacuation of the previously noted pararenal fluid collection in this location. However, the separate curvilinear fluid collection within the anterior pararenal space measuring 10.1 x 2.2 cm by roughly 9.3 cm in craniocaudal dimension is unchanged. This does not appear to communicate with the percutaneous drainage catheter. Stomach/Bowel: Nasogastric tube extends into the mid to distal body of the stomach. Rectal catheter balloon noted within the a rectal vault. Mild sigmoid diverticulosis. The stomach, small bowel, and large bowel are otherwise unremarkable. Appendix normal. No free intraperitoneal gas or fluid. Vascular/Lymphatic: Mild aortoiliac atherosclerotic calcification. No aortic aneurysm. No pathologic adenopathy within the abdomen and pelvis. Reproductive: Heterogeneous enhancement of the uterine corpus may relate to multiple underlying small uterine fibroids. The pelvic organs are otherwise unremarkable. Other: Dermal thickening involving the a  pannus appears stable since prior examination and may be post inflammatory or postsurgical in nature, but is nonspecific. Tiny fat containing umbilical hernia. Musculoskeletal: No acute bone abnormality. Degenerative changes are seen within the lumbar spine. Advanced degenerative changes are seen within the hips bilaterally, right greater than left. No lytic or blastic bone lesion. IMPRESSION: Interval percutaneous drainage of the a perirenal fluid collection adjacent to the upper pole of the left kidney with complete evacuation of this collection. Note that the catheter extends through the left ninth intercostal space. Interval development of a small dependent left pleural effusion. Persistent unchanged curvilinear fluid collection within the left anterior pararenal space measuring up to 10.1 cm in greatest dimension. This does not appear to communicate with the percutaneous drainage catheter placed within the perirenal collection. Stable bilateral nonobstructing nephrolithiasis. No urolithiasis. Bilateral double-J ureteral stents are in expected position with interval near complete resolution of hydronephrosis. Interval Foley decompression of the bladder. Aortic Atherosclerosis (ICD10-I70.0). Electronically Signed   By: Fidela Salisbury M.D.   On: 07/29/2021 21:15    Labs:  CBC: Recent Labs    07/26/21 0618 07/27/21 0950 07/28/21 0602 07/30/21 1227  WBC 14.4* 13.3* 11.2* 9.6  HGB 6.9* 8.2* 7.8* 7.8*  HCT 21.4* 25.2* 25.0* 24.1*  PLT 247 321 355 402*    COAGS: Recent Labs    06/27/21 0032 07/16/21 2217  INR 1.4* 1.2  APTT 33 33    BMP: Recent Labs    07/27/21 0649 07/28/21 0602  07/29/21 0615 07/30/21 0626 07/31/21 0459 08/01/21 0411  NA 142 144 146*  --  145  --   K 3.3* 3.5 3.3* 3.8 3.9 3.8  CL 105 107 108  --  108  --   CO2 23 28 25   --  26  --   GLUCOSE 148* 120* 216*  --  136*  --   BUN 85* 88* 87*  --  97*  --   CALCIUM 9.1 9.1 9.3  --  9.6  --   CREATININE 2.38* 2.26*  2.26*  --  2.01*  --   GFRNONAA 21* 22* 22*  --  26*  --     LIVER FUNCTION TESTS: Recent Labs    07/16/21 2217 07/17/21 0434 07/21/21 0528 07/28/21 0602 07/31/21 0459  BILITOT 0.4 0.5 0.5 0.7  --   AST 50* 52* 33 19  --   ALT 92* 88* 53* 31  --   ALKPHOS 108 107 101 98  --   PROT 5.8* 6.0* 5.8* 6.7  --   ALBUMIN 1.7* 1.8* 1.6* 2.0* 2.0*    Assessment and Plan: Patient is known to IR for recent consult regarding image guided percutaneous G-tube placement- will be placed today.   New request for removal of existing left peri-renal drain that was placed 8/26- CT 9/3 with evidence of resolution, will be removed in IR today.  CT 9/3 with persistent unchanged fluid collection within the left anterior pararenal space, request received for new drainage catheter placement, will place in CT today.  Risks and benefits discussed with the patient's spouse including bleeding, infection, damage to adjacent structures, bowel perforation/fistula connection, and sepsis.  All questions were answered, agreeable to proceed. Consent signed and in chart.   Electronically Signed: Hedy Jacob, PA-C 08/01/2021, 11:40 AM   I spent a total of 15 Minutes at the the patient's bedside AND on the patient's hospital floor or unit, greater than 50% of which was counseling/coordinating care for left pararenal fluid collection.

## 2021-08-01 NOTE — Procedures (Signed)
Vascular and Interventional Radiology Procedure Note  Patient: Heather Lopez DOB: [birth year] Medical Record Number: [id number] Note Date/Time: 08/01/21 2:31 PM   Performing Physician: Michaelle Birks, MD Assistant(s): None  Diagnosis: Dysphagia  Procedure: PERCUTANEOUS GASTROSTOMY TUBE PLACEMENT  Anesthesia: Conscious Sedation Complications: None Estimated Blood Loss: Minimal  Findings:  Successful placement of a  63 F gastrostomy tube under fluoroscopy.   See detailed procedure note with images in PACS. The patient tolerated the procedure well without incident or complication and was returned to ICU in stable condition.    Michaelle Birks, MD [facility] Radiology   Pager. [place]

## 2021-08-01 NOTE — TOC Progression Note (Signed)
Transition of Care [facility]) - Progression Note    Patient Details  Name: Heather Lopez MRN: [id number] Date of Birth: Aug 04, 1946  Transition of Care [facility]) CM/SW [place], RN Phone Number: 08/01/2021, 3:11 PM  Clinical Narrative:  Raquel Sarna from [facility] called for discharge update. Discussed with Attending, who says will assess in two days, patient scheduled for a couple of procedures. Given information to [place].     Expected Discharge Plan: [place] Barriers to Discharge: Continued Medical Work up  Expected Discharge Plan and Services Expected Discharge Plan: [place] In-house Referral: Clinical Social Work   Post Acute Care Choice: [place] Living arrangements for the past 2 months: Single Family Home                                       Social Determinants of Health (SDOH) Interventions    Readmission Risk Interventions No flowsheet data found.

## 2021-08-01 NOTE — Procedures (Signed)
Vascular and Interventional Radiology Procedure Note  Patient: Heather Lopez DOB: [birth year] Medical Record Number: [id number] Note Date/Time: 08/01/21 1:13 PM   Performing Physician: Michaelle Birks, MD Assistant(s): None  Diagnosis: Peri-renal abscess  Procedure: DRAINAGE CATHETER PLACEMENT OF LEFT PERI-RENAL ABSCESS  Anesthesia: Conscious Sedation Complications: None Estimated Blood Loss:  0 mL Specimens: Sent for Gram Stain, Aerobe Culture, and Anerobe Culture  Findings:  Successful CT-guided drainage catheter placement of 12 F catheter into L peri-renal abscess. Removal of previously placed drainage catheter.  Plan:  - Flush drain with 10 mL Normal Saline every 12 hours. - Follow up drain evaluation / sinogram in 2 week(s).  See detailed procedure note with images in PACS. The patient tolerated the procedure well without incident or complication and was returned to ICU in stable condition.    Michaelle Birks, MD Vascular [facility] Interventional Radiology Special[facility] Radiology   Pager. [place]

## 2021-08-01 NOTE — Progress Notes (Signed)
Physical Therapy Treatment Patient Details Name: Heather Lopez MRN: [id number] DOB: 1946-11-10 Today's Date: 08/01/2021    History of Present Illness Pt is a [name] who presented to the ED on 06/27/21 with chief complaints of progressive shortness of breath, productive cough, loss of appetite, nausea, dry heaving fatigue, malaise and generalized body aches. Patient initially went to her PCP on 7/29 with complaints of rhinorrhea, harsh cough, loss of appetite, nausea, fatigue, malaise and generalized body aches x2 days, patient was diagnosed with pneumonia of which she received ceftriaxone 1 mg IM and sent home with Levaquin, prednisone and Tessalon. On 8/4 pt developed acute abdominal pain with CT showing viscus perforation. Pt urgently taken to the OR for ex lap. Pt is also s/p cystoscopy with B ureteral stent placement for L obstructed kidney stone on 8/10.    PT Comments    Pt has continued to be agreeable to PT treatment and is making slow progress towards therapy goals. Pt able to complete supine to sit bed mobility with total assistance + 2. Pt unable to maintain seated posture due to reports of extreme discomfort and pain. Pt currently on 40 L of oxygen with HFNC and maintained SpO2 >90% throughout session. Pt continuing to be limited by decreased B LE strength/ROM, activity tolerance, and generalized deconditioning due to prolonged bedrest. Pt was evaluated and goals updated to reflect patient progress.  Pt will continue to benefit from skilled PT services during stay to improve mobility and activity tolerance.    Follow Up Recommendations  LTACH;Supervision/Assistance - 24 hour     Equipment Recommendations  None recommended by PT    Recommendations for Other Services       Precautions / Restrictions Precautions Precautions: Fall Precaution Comments: NG tube, abdominal incision & JP drains, foley catheter, fecal tube, L jugular tunneled hemodialysis catheter Restrictions Weight  Bearing Restrictions: No    Mobility  Bed Mobility Overal bed mobility: Needs Assistance Bed Mobility: Supine to Sit Rolling: Total assist;+2 for physical assistance   Supine to sit: Total assist;+2 for physical assistance Sit to supine: +2 for physical assistance;Total assist   General bed mobility comments: Pt able to sitt EOB with total assistance + 2 for management of B LEs and trunk. Pt unable to tolerate sitting position for >10 seconds. Pt assisted back to supine position and repositioned in bed utilizing trendelenburg position. Pt left in semi-fowler position with PRAFO boots on B feet to prevent pressure injury, and pillows under B UEs for comfort.    Transfers Overall transfer level: Needs assistance      General transfer comment: not appropriate at this time  Ambulation/Gait    Stairs    Wheelchair Mobility    Modified Rankin (Stroke Patients Only)       Balance Overall balance assessment: Needs assistance Sitting-balance support: Feet unsupported;Bilateral upper extremity supported Sitting balance-Leahy Scale: Zero Sitting balance - Comments: Total assist x 2 to maintain seated posture Postural control: Posterior lean       Cognition Arousal/Alertness: Awake/alert Behavior During Therapy: Flat affect Overall Cognitive Status: Impaired/Different from baseline      Exercises General Exercises - Lower Extremity Ankle Circles/Pumps: AROM;Both;20 reps Short Arc Quad: AROM;5 reps;Supine;Strengthening    General Comments        Pertinent Vitals/Pain Pain Assessment: Faces Faces Pain Scale: Hurts even more Pain Location: Diffuses B LE pain Pain Descriptors / Indicators: Aching;Discomfort;Grimacing;Guarding Pain Intervention(s): Monitored during session;Repositioned    Home Living  Prior Function            PT Goals (current goals can now be found in the care plan section) Acute Rehab PT Goals Patient Stated Goal: to go home PT Goal  Formulation: With family Time For Goal Achievement: 08/15/21 Potential to Achieve Goals: Fair Progress towards PT goals: Progressing toward goals    Frequency    Min 2X/week      PT Plan Current plan remains appropriate    Co-evaluation              AM-PAC PT "6 Clicks" Mobility   Outcome Measure  Help needed turning from your back to your side while in a flat bed without using bedrails?: Total Help needed moving from lying on your back to sitting on the side of a flat bed without using bedrails?: Total Help needed moving to and from a bed to a chair (including a wheelchair)?: Total Help needed standing up from a chair using your arms (e.g., wheelchair or bedside chair)?: Total Help needed to walk in hospital room?: Total Help needed climbing 3-5 steps with a railing? : Total 6 Click Score: 6    End of Session Equipment Utilized During Treatment: Oxygen Activity Tolerance: Patient limited by fatigue;Patient limited by pain Patient left: in bed;with call bell/phone within reach;with family/visitor present Nurse Communication: Mobility status PT Visit Diagnosis: Muscle weakness (generalized) (M62.81);Difficulty in walking, not elsewhere classified (R26.2)     Time: 7943-2761 PT Time Calculation (min) (ACUTE ONLY): 28 min  Charges:                        Andrey Campanile, SPT    Andrey Campanile 08/01/2021, 11:53 AM

## 2021-08-01 NOTE — Sedation Documentation (Signed)
Abcess drain placed by MD; pt. tx back to bed. To be transported to IR lab now for G-tube placement by Dr. Maryelizabeth Kaufmann. Pt. Stable for tx to next dept.

## 2021-08-01 NOTE — Progress Notes (Signed)
[facility]  ROUNDING NOTE   Subjective:   Heather Lopez is a 75 y.o. white female who was admitted for community acquired pneumonia on 06/27/2021     Hospital course complicated by duodenal perforation s/p repair on 06/30/2021 Left obstructive hydronephrosis and renal abscess, Patient underwent cystoscopy and b/l ureteral stent placement 8/10 8/26- CT guided aspiration of perirenal abscess- 5 ml purulent fluid aspirated   Lab Results  Component Value Date   CREATININE 2.01 (H) 07/31/2021   CREATININE 2.26 (H) 07/29/2021   CREATININE 2.26 (H) 07/28/2021   09/05 0701 - 09/06 0700 In: 4360.8 [I.V.:883.5; NG/GT:2450; IV Piggyback:997.3] Out: 4100 [Urine:4025; Stool:75]   -Renal function slowly trending in the right direction. Creatinine down to 2.01. Urine output 4.1 L. Still on high flow nasal cannula.   Objective:  Vital signs in last 24 hours:  Temp:  [99.5 F (37.5 C)-99.8 F (37.7 C)] 99.8 F (37.7 C) (09/06 0200) Pulse Rate:  [90-107] 94 (09/06 1409) Resp:  [15-25] 22 (09/06 1409) BP: (108-165)/(58-105) 109/58 (09/06 1409) SpO2:  [8 %-100 %] 93 % (09/06 1409) FiO2 (%):  [40 %] 40 % (09/06 0716) Weight:  [156.2 kg] 156.2 kg (09/06 0500)  Weight change:  Filed Weights   07/29/21 0500 07/30/21 0500 08/01/21 0500  Weight: (!) 148 kg (!) 149.5 kg (!) 156.2 kg    Intake/Output: I/O last 3 completed shifts: In: 24 [I.V.:895.4; Other:50; NG/GT:2870; IV Piggyback:1997.6] Out: 9562 [Urine:5475; Stool:150]   Intake/Output this shift:  Total I/O In: -  Out: 1000 [Urine:1000]  Physical Exam: General: No acute distress  Head: HFNC O2, NGT in place  Eyes: Anicteric  Lungs:  +coarse breath sounds  Heart: S1S2 on rubs  Abdomen:  Soft, NT  Extremities: + peripheral edema   Neurologic: Awake, alert, follows commands  GU Foley with yellow urine  Access:  LIJ permcath 8/18  Rectal tube  Basic Metabolic Panel: Recent Labs  Lab 07/26/21 0618  07/27/21 0649 07/28/21 0602 07/29/21 0615 07/30/21 0626 07/31/21 0459 08/01/21 0411  NA 143 142 144 146*  --  145  --   K 3.3* 3.3* 3.5 3.3* 3.8 3.9 3.8  CL 106 105 107 108  --  108  --   CO2 24 23 28 25   --  26  --   GLUCOSE 114* 148* 120* 216*  --  136*  --   BUN 78* 85* 88* 87*  --  97*  --   CREATININE 2.64* 2.38* 2.26* 2.26*  --  2.01*  --   CALCIUM 9.0 9.1 9.1 9.3  --  9.6  --   MG 2.1  --  2.0 2.1 2.2 2.2 2.4  PHOS 5.0*  --  4.5 3.9 4.3 3.6  3.7 4.3     Liver Function Tests: Recent Labs  Lab 07/28/21 0602 07/31/21 0459  AST 19  --   ALT 31  --   ALKPHOS 98  --   BILITOT 0.7  --   PROT 6.7  --   ALBUMIN 2.0* 2.0*    No results for input(s): LIPASE, AMYLASE in the last 168 hours. Recent Labs  Lab 07/28/21 0602  AMMONIA 11     CBC: Recent Labs  Lab 07/26/21 0618 07/27/21 0950 07/28/21 0602 07/30/21 1227  WBC 14.4* 13.3* 11.2* 9.6  NEUTROABS 11.9*  --   --   --   HGB 6.9* 8.2* 7.8* 7.8*  HCT 21.4* 25.2* 25.0* 24.1*  MCV 89.9 90.0 89.3 92.3  PLT  247 321 355 402*     Cardiac Enzymes: No results for input(s): CKTOTAL, CKMB, CKMBINDEX, TROPONINI in the last 168 hours.  BNP: Invalid input(s): POCBNP  CBG: Recent Labs  Lab 07/31/21 2227 08/01/21 0040 08/01/21 0411 08/01/21 0717 08/01/21 1115  GLUCAP 170* 190* 144* 139* 160*     Microbiology: Results for orders placed or performed during the hospital encounter of 06/27/21  Resp Panel by RT-PCR (Flu A&B, Covid) Nasopharyngeal Swab     Status: None   Collection Time: 06/27/21 12:32 AM   Specimen: Nasopharyngeal Swab; Nasopharyngeal(NP) swabs in vial transport medium  Result Value Ref Range Status   SARS Coronavirus 2 by RT PCR NEGATIVE NEGATIVE Final    Comment: (NOTE) SARS-CoV-2 target nucleic acids are NOT DETECTED.  The SARS-CoV-2 RNA is generally detectable in upper respiratory specimens during the acute phase of infection. The lowest concentration of SARS-CoV-2 viral copies this  assay can detect is 138 copies/mL. A negative result does not preclude SARS-Cov-2 infection and should not be used as the sole basis for treatment or other patient management decisions. A negative result may occur with  improper specimen collection/handling, submission of specimen other than nasopharyngeal swab, presence of viral mutation(s) within the areas targeted by this assay, and inadequate number of viral copies(<138 copies/mL). A negative result must be combined with clinical observations, patient history, and epidemiological information. The expected result is Negative.  Fact Sheet for Patients:  EntrepreneurPulse.com.au  Fact Sheet for Healthcare Providers:  IncredibleEmployment.be  This test is no t yet approved or cleared by the [place] FDA and  has been authorized for detection and/or diagnosis of SARS-CoV-2 by FDA under an Emergency Use Authorization (EUA). This EUA will remain  in effect (meaning this test can be used) for the duration of the COVID-19 declaration under Section 564(b)(1) of the Act, 21 U.S.C.section 360bbb-3(b)(1), unless the authorization is terminated  or revoked sooner.       Influenza A by PCR NEGATIVE NEGATIVE Final   Influenza B by PCR NEGATIVE NEGATIVE Final    Comment: (NOTE) The Xpert Xpress SARS-CoV-2/FLU/RSV plus assay is intended as an aid in the diagnosis of influenza from Nasopharyngeal swab specimens and should not be used as a sole basis for treatment. Nasal washings and aspirates are unacceptable for Xpert Xpress SARS-CoV-2/FLU/RSV testing.  Fact Sheet for Patients: EntrepreneurPulse.com.au  Fact Sheet for Healthcare Providers: IncredibleEmployment.be  This test is not yet approved or cleared by the [place] FDA and has been authorized for detection and/or diagnosis of SARS-CoV-2 by FDA under an Emergency Use Authorization (EUA). This EUA will  remain in effect (meaning this test can be used) for the duration of the COVID-19 declaration under Section 564(b)(1) of the Act, 21 U.S.C. section 360bbb-3(b)(1), unless the authorization is terminated or revoked.  Performed at [facility], [address] [place], [place] [address]   Blood culture (routine x 2)     Status: None   Collection Time: 06/27/21 12:43 AM   Specimen: BLOOD  Result Value Ref Range Status   Specimen Description BLOOD RIGHT ASSIST CONTROL  Final   Special Requests   Final    BOTTLES DRAWN AEROBIC AND ANAEROBIC Blood Culture results may not be optimal due to an inadequate volume of blood received in culture bottles   Culture   Final    NO GROWTH 5 DAYS Performed at [facility], [address], [place], [place] [address]    Report Status 07/02/2021 FINAL  Final  Blood culture (routine x 2)     Status: None   Collection Time: 06/27/21  1:45 AM   Specimen: BLOOD  Result Value Ref Range Status   Specimen Description BLOOD RIGHT ASSIST CONTROL  Final   Special Requests   Final    BOTTLES DRAWN AEROBIC AND ANAEROBIC Blood Culture adequate volume   Culture   Final    NO GROWTH 5 DAYS Performed at [facility], [address] [place], [place] [address]    Report Status 07/02/2021 FINAL  Final  Urine Culture     Status: Abnormal   Collection Time: 06/27/21  2:39 AM   Specimen: In/Out Cath Urine  Result Value Ref Range Status   Specimen Description   Final    IN/OUT CATH URINE Performed at [facility], [address], [place], [place] [address]    Special Requests   Final    NONE Performed at [facility], [place], [place] [address]    Culture 30,000 COLONIES/mL YEAST (A)  Final   Report Status 06/28/2021 FINAL  Final  MRSA Next Gen by PCR, Nasal     Status: None   Collection Time: 06/30/21  5:01 AM   Specimen: Nasal Mucosa; Nasal Swab  Result Value Ref Range Status   MRSA by PCR  Next Gen NOT DETECTED NOT DETECTED Final    Comment: (NOTE) The GeneXpert MRSA Assay (FDA approved for NASAL specimens only), is one component of a comprehensive MRSA colonization surveillance program. It is not intended to diagnose MRSA infection nor to guide or monitor treatment for MRSA infections. Test performance is not FDA approved in patients less than 13 years old. Performed at [facility], [address], [place], [place] [address]   Urine Culture     Status: Abnormal   Collection Time: 07/05/21 10:36 AM   Specimen: PATH Other; Urine  Result Value Ref Range Status   Specimen Description   Final    URINE, RANDOM LEFT RENAL PELVIS URINE Performed at [facility], [address], [place], [place] [address]    Special Requests   Final    NONE Performed at [facility], [address] [place], [place] [address]    Culture 70,000 [place] (A)  Final   Report Status 07/12/2021 FINAL  Final  Urine Culture     Status: Abnormal   Collection Time: 07/05/21 10:48 AM   Specimen: PATH Other; Urine  Result Value Ref Range Status   Specimen Description   Final    URINE, RANDOM RIGHT KIDNEY URINE Performed at [facility], [address], [place], [place] [address]    Special Requests   Final    NONE Performed at [facility], [address] [place], [place] [address]    Culture (A)  Final    >=100,000 COLONIES/mL CANDIDA GLABRATA SEE SEPARATE REPORT Performed at [facility], [place] [address], [place],  [address]    Report Status 07/17/2021 FINAL  Final  Antifungal AST 9 Drug Panel     Status: None   Collection Time: 07/05/21 10:48 AM  Result Value Ref Range Status   Organism ID, Yeast Candida glabrata  Corrected    Comment: (NOTE) Identification performed by account, not confirmed by this laboratory. CORRECTED ON 08/21 AT 1535: PREVIOUSLY REPORTED AS Preliminary report    Amphotericin B  MIC 0.5 ug/mL  Final    Comment: (NOTE) Breakpoints have been established for only  some organism-drug combinations as indicated. This test was developed and its performance characteristics determined by [facility]. It has not been cleared or approved by the Food and Drug Administration.    Anidulafungin MIC Comment  Final    Comment: (NOTE) 0.03 ug/mL Susceptible Breakpoints have been established for only some organism-drug combinations as indicated. This test was developed and its performance characteristics determined by [facility]. It has not been cleared or approved by the Food and Drug Administration.    Caspofungin MIC Comment  Final    Comment: (NOTE) 0.06 ug/mL Susceptible Breakpoints have been established for only some organism-drug combinations as indicated. This test was developed and its performance characteristics determined by [facility]. It has not been cleared or approved by the Food and Drug Administration.    Micafungin MIC Comment  Final    Comment: (NOTE) 0.016 ug/mL Susceptible Breakpoints have been established for only some organism-drug combinations as indicated. This test was developed and its performance characteristics determined by [facility]. It has not been cleared or approved by the Food and Drug Administration.    Posaconazole MIC 0.25 ug/mL  Final    Comment: (NOTE) Breakpoints have been established for only some organism-drug combinations as indicated. This test was developed and its performance characteristics determined by [facility]. It has not been cleared or approved by the Food and Drug Administration.    Fluconazole Islt MIC 2.0 ug/mL  Final    Comment: (NOTE) Susceptible Dose Dependent Breakpoints have been established for only some organism-drug combinations as indicated. This test was developed and its performance characteristics determined by [facility]. It has not been cleared or approved by the Food and Drug Administration.     Flucytosine MIC 0.06 ug/mL or less  Final    Comment: (NOTE) Breakpoints have been established for only some organism-drug combinations as indicated. This test was developed and its performance characteristics determined by [facility]. It has not been cleared or approved by the Food and Drug Administration.    Itraconazole MIC 0.12 ug/mL  Final    Comment: (NOTE) Breakpoints have been established for only some organism-drug combinations as indicated. This test was developed and its performance characteristics determined by [facility]. It has not been cleared or approved by the Food and Drug Administration.    Voriconazole MIC 0.06 ug/mL  Final    Comment: (NOTE) Breakpoints have been established for only some organism-drug combinations as indicated. This test was developed and its performance characteristics determined by [facility]. It has not been cleared or approved by the Food and Drug Administration. Performed At: [facility] [address] [place], [place] [id number] Rush Farmer MD HB:[id number]    Source CANDIDA GLABRATA SUSCEPTIBILITY URINE CULTURE  Final    Comment: Performed at [facility], [place] [address] [place], [place] [address]  CULTURE, BLOOD (ROUTINE X 2) w Reflex to ID Panel     Status: None   Collection Time: 07/15/21  7:57 PM   Specimen: BLOOD  Result Value Ref Range Status   Specimen Description BLOOD LEFT ASSIST CONTROL  Final   Special Requests   Final    BOTTLES DRAWN AEROBIC AND ANAEROBIC Blood Culture adequate volume   Culture   Final    NO GROWTH 5 DAYS Performed at [facility], [address], [place], [place] [address]    Report Status 07/20/2021 FINAL  Final  CULTURE, BLOOD (ROUTINE X 2) w Reflex to ID Panel     Status: None   Collection Time: 07/15/21  8:07 PM  Specimen: BLOOD  Result Value Ref Range Status   Specimen Description BLOOD LEFT FOREARM  Final   Special Requests   Final    BOTTLES DRAWN AEROBIC AND  ANAEROBIC Blood Culture adequate volume   Culture   Final    NO GROWTH 5 DAYS Performed at [facility], [address] [place], [place] [address]    Report Status 07/20/2021 FINAL  Final  Body fluid culture w Gram Stain     Status: None   Collection Time: 07/21/21  1:23 PM   Specimen: Peritoneal Washings; Body Fluid  Result Value Ref Range Status   Specimen Description   Final    PERITONEAL Performed at [facility], [address] [place], [place] [address]    Special Requests   Final    NONE Performed at [facility], [place], [place] [address]    Gram Stain   Final    MODERATE WBC PRESENT,BOTH PMN AND MONONUCLEAR NO ORGANISMS SEEN    Culture   Final    RARE CANDIDA GLABRATA Performed at [facility] SEE RESULTS IN SEPERATE REPORT Performed at [facility], [place] [address] [place], [place] [address]    Report Status 07/31/2021 FINAL  Final  Antifungal AST 9 Drug Panel     Status: None   Collection Time: 07/21/21  1:23 PM  Result Value Ref Range Status   Organism ID, Yeast Candida glabrata  Corrected    Comment: (NOTE) Identification performed by account, not confirmed by this laboratory. CORRECTED ON 09/04 AT 1435: PREVIOUSLY REPORTED AS Preliminary report    Amphotericin B MIC 0.5 ug/mL  Final    Comment: (NOTE) Breakpoints have been established for only some organism-drug combinations as indicated. This test was developed and its performance characteristics determined by [facility]. It has not been cleared or approved by the Food and Drug Administration.    Anidulafungin MIC Comment  Final    Comment: (NOTE) 0.03 ug/mL Susceptible Breakpoints have been established for only some organism-drug combinations as indicated. This test was developed and its performance characteristics determined by [facility]. It has not been cleared or approved by the Food and Drug Administration.    Caspofungin MIC Comment   Final    Comment: (NOTE) 0.06 ug/mL Susceptible Breakpoints have been established for only some organism-drug combinations as indicated. This test was developed and its performance characteristics determined by [facility]. It has not been cleared or approved by the Food and Drug Administration.    Micafungin MIC Comment  Final    Comment: (NOTE) 0.016 ug/mL Susceptible Breakpoints have been established for only some organism-drug combinations as indicated. This test was developed and its performance characteristics determined by [facility]. It has not been cleared or approved by the Food and Drug Administration.    Posaconazole MIC 0.25 ug/mL  Final    Comment: (NOTE) Breakpoints have been established for only some organism-drug combinations as indicated. This test was developed and its performance characteristics determined by [facility]. It has not been cleared or approved by the Food and Drug Administration.    Fluconazole Islt MIC 4.0 ug/mL  Final    Comment: (NOTE) Susceptible Dose Dependent Breakpoints have been established for only some organism-drug combinations as indicated. This test was developed and its performance characteristics determined by [facility]. It has not been cleared or approved by the Food and Drug Administration.    Flucytosine MIC 0.06 ug/mL or less  Final    Comment: (NOTE) Breakpoints have been  established for only some organism-drug combinations as indicated. This test was developed and its performance characteristics determined by [facility]. It has not been cleared or approved by the Food and Drug Administration.    Itraconazole MIC 0.25 ug/mL  Final    Comment: (NOTE) Breakpoints have been established for only some organism-drug combinations as indicated. This test was developed and its performance characteristics determined by [facility]. It has not been cleared or approved by the Food and Drug Administration.    Voriconazole MIC 0.06 ug/mL  Final     Comment: (NOTE) Breakpoints have been established for only some organism-drug combinations as indicated. This test was developed and its performance characteristics determined by [facility]. It has not been cleared or approved by the Food and Drug Administration. Performed At: [facility] [place], [place] [id number] Rush Farmer MD NW:[id number]    Source CANDIDA GLABRATA/ PERITONEAL FLUID  Final    Comment: Performed at [facility], [place] [address], [place], [place] [address]  Aerobic/Anaerobic Culture w Gram Stain (surgical/deep wound)     Status: None   Collection Time: 07/21/21  3:32 PM   Specimen: Abdomen  Result Value Ref Range Status   Specimen Description   Final    ABDOMEN Performed at [facility], [address], [place], [place] [address]    Special Requests   Final    NONE Performed at [facility], [address] [place], [place] [address]    Gram Stain   Final    MODERATE SQUAMOUS EPITHELIAL CELLS PRESENT WBC PRESENT, PREDOMINANTLY PMN NO ORGANISMS SEEN    Culture   Final    RARE CANDIDA GLABRATA NO ANAEROBES ISOLATED Performed at [facility], [place] [address] [place], [place] [address]    Report Status 07/26/2021 FINAL  Final  CULTURE, BLOOD (ROUTINE X 2) w Reflex to ID Panel     Status: Abnormal   Collection Time: 07/27/21  4:55 PM   Specimen: BLOOD  Result Value Ref Range Status   Specimen Description   Final    BLOOD BLOOD LEFT HAND Performed at [facility], [address] [place], [place] [address]    Special Requests   Final    BOTTLES DRAWN AEROBIC AND ANAEROBIC Blood Culture results may not be optimal due to an inadequate volume of blood received in culture bottles Performed at [facility], [address], [place], [place] [address]    Culture  Setup Time   Final    Organism ID to follow [place] CRITICAL RESULT CALLED TO, READ  BACK BY AND VERIFIED WITH: [place] Performed at [facility], [address] [place], [place] [address]    Culture (A)  Final    STAPHYLOCOCCUS EPIDERMIDIS THE SIGNIFICANCE OF ISOLATING THIS ORGANISM FROM A SINGLE SET OF BLOOD CULTURES WHEN MULTIPLE SETS ARE DRAWN IS UNCERTAIN. PLEASE NOTIFY THE MICROBIOLOGY DEPARTMENT WITHIN ONE WEEK IF SPECIATION AND SENSITIVITIES ARE REQUIRED. Performed at [facility], [place] [address] [place], [place] [address]    Report Status 07/31/2021 FINAL  Final  Blood Culture ID Panel (Reflexed)     Status: Abnormal   Collection Time: 07/27/21  4:55 PM  Result Value Ref Range Status   Enterococcus faecalis NOT DETECTED NOT DETECTED Final   Enterococcus Faecium NOT DETECTED NOT DETECTED Final   Listeria monocytogenes NOT DETECTED NOT DETECTED Final   Staphylococcus species DETECTED (A) NOT  DETECTED Final    Comment: CRITICAL RESULT CALLED TO, READ BACK BY AND VERIFIED WITH: [place] ON 07/28/21 BY SS    Staphylococcus aureus (BCID) NOT DETECTED NOT DETECTED Final   Staphylococcus epidermidis DETECTED (A) NOT DETECTED Final    Comment: Methicillin (oxacillin) resistant coagulase negative staphylococcus. Possible blood culture contaminant (unless isolated from more than one blood culture draw or clinical case suggests pathogenicity). No antibiotic treatment is indicated for blood  culture contaminants. CRITICAL RESULT CALLED TO, READ BACK BY AND VERIFIED WITH: [place] ON 07/28/21 BY SS    Staphylococcus lugdunensis NOT DETECTED NOT DETECTED Final   Streptococcus species NOT DETECTED NOT DETECTED Final   Streptococcus agalactiae NOT DETECTED NOT DETECTED Final   Streptococcus pneumoniae NOT DETECTED NOT DETECTED Final   Streptococcus pyogenes NOT DETECTED NOT DETECTED Final   A.calcoaceticus-baumannii NOT DETECTED NOT DETECTED Final   Bacteroides fragilis NOT DETECTED NOT DETECTED Final    Enterobacterales NOT DETECTED NOT DETECTED Final   Enterobacter cloacae complex NOT DETECTED NOT DETECTED Final   Escherichia coli NOT DETECTED NOT DETECTED Final   Klebsiella aerogenes NOT DETECTED NOT DETECTED Final   Klebsiella oxytoca NOT DETECTED NOT DETECTED Final   Klebsiella pneumoniae NOT DETECTED NOT DETECTED Final   Proteus species NOT DETECTED NOT DETECTED Final   Salmonella species NOT DETECTED NOT DETECTED Final   Serratia marcescens NOT DETECTED NOT DETECTED Final   Haemophilus influenzae NOT DETECTED NOT DETECTED Final   Neisseria meningitidis NOT DETECTED NOT DETECTED Final   Pseudomonas aeruginosa NOT DETECTED NOT DETECTED Final   Stenotrophomonas maltophilia NOT DETECTED NOT DETECTED Final   Candida albicans NOT DETECTED NOT DETECTED Final   Candida auris NOT DETECTED NOT DETECTED Final   Candida glabrata NOT DETECTED NOT DETECTED Final   Candida krusei NOT DETECTED NOT DETECTED Final   Candida parapsilosis NOT DETECTED NOT DETECTED Final   Candida tropicalis NOT DETECTED NOT DETECTED Final   Cryptococcus neoformans/gattii NOT DETECTED NOT DETECTED Final   Methicillin resistance mecA/C DETECTED (A) NOT DETECTED Final    Comment: CRITICAL RESULT CALLED TO, READ BACK BY AND VERIFIED WITH: [place] ON 07/28/21 BY SS Performed at [facility], [address] [place], [place] [address]   CULTURE, BLOOD (ROUTINE X 2) w Reflex to ID Panel     Status: None   Collection Time: 07/27/21  4:56 PM   Specimen: BLOOD  Result Value Ref Range Status   Specimen Description BLOOD BLOOD RIGHT HAND  Final   Special Requests   Final    BOTTLES DRAWN AEROBIC AND ANAEROBIC Blood Culture adequate volume   Culture   Final    NO GROWTH 5 DAYS Performed at [facility], [address] [place], [place] [address]    Report Status 08/01/2021 FINAL  Final  CULTURE, BLOOD (ROUTINE X 2) w Reflex to ID Panel     Status: None (Preliminary result)   Collection  Time: 07/28/21  8:59 PM   Specimen: BLOOD  Result Value Ref Range Status   Specimen Description BLOOD BLOOD RIGHT HAND  Final   Special Requests   Final    BOTTLES DRAWN AEROBIC AND ANAEROBIC Blood Culture results may not be optimal due to an inadequate volume of blood received in culture bottles   Culture  Setup Time   Final    TEST CANCELLED PER MD Dr. Delaine Lame Performed at [facility], [address] [place], [place] [address]  Report Status PENDING  Incomplete    Coagulation Studies: No results for input(s): LABPROT, INR in the last 72 hours.    Urinalysis: No results for input(s): COLORURINE, LABSPEC, PHURINE, GLUCOSEU, HGBUR, BILIRUBINUR, KETONESUR, PROTEINUR, UROBILINOGEN, NITRITE, LEUKOCYTESUR in the last 72 hours.  Invalid input(s): APPERANCEUR      Imaging: IR GASTROSTOMY TUBE MOD SED  Result Date: 08/01/2021 INDICATION: Dysphagia EXAM: PERC PLACEMENT GASTROSTOMY MEDICATIONS: None ANESTHESIA/SEDATION: Versed 0 mg IV; Fentanyl 37.5 mcg IV Moderate Sedation Time:  30 minutes The patient was continuously monitored during the procedure by the interventional radiology nurse under my direct supervision. CONTRAST:  Secondary to patient's reported iodinated contrast allergy, gadolinium contrast was used for the procedure-administered into the gastric lumen. FLUOROSCOPY TIME:  Fluoroscopy Time: 2 minutes 54 seconds (33.5 mGy). COMPLICATIONS: None immediate. PROCEDURE: Informed written consent was obtained from the patient and/or patient's representative after a thorough discussion of the procedural risks, benefits and alternatives. All questions were addressed. Maximal Sterile barrier Technique was utilized including caps, mask, sterile gowns, sterile gloves, sterile drape, hand hygiene and skin antiseptic. A timeout was performed prior to the initiation of the procedure. The patient was placed on the procedure table in the supine position. Pre-procedure abdominal film  confirmed visualization of the transverse colon. The patient was prepped and draped in usual sterile fashion. The stomach was insufflated with air via the indwelling nasogastric tube. Under fluoroscopy, a puncture site was selected and local analgesia achieved with 1% lidocaine infiltrated subcutaneously. Under fluoroscopic guidance, a gastropexy needle was passed into the stomach and the T-bar suture was released. Entry into the stomach was confirmed with fluoroscopy, aspiration of air, and injection of contrast material. This was repeated with an additional gastropexy suture (for a total of 2 fasteners). At the center of these gastropexy sutures, a dermatotomy was performed. An 18 gauge needle was passed into the stomach at the site of this dermatotomy, and position within the gastric lumen again confirmed under fluoroscopy using aspiration of air and contrast injection. An Amplatz guidewire was passed through this needle and intraluminal placement within the stomach was confirmed by fluoroscopy. The needle was removed. Over the guidewire, a dilator peel-away sheath was introduced and the 18 French gastrostomy tube was placed over wire. The retention balloon of the percutaneous gastrostomy tube was inflated with 10 mL of sterile water. The tube was withdrawn until the retention balloon was at the edge of the gastric lumen. The external bumper was brought to the abdominal wall. Contrast was injected through the gastrostomy tube, confirming intraluminal positioning. The patient tolerated the procedure well without any immediate post-procedural complications. IMPRESSION: Technically successful placement of 18 Fr gastrostomy tube, as above. RECOMMENDATIONS: The patient is to return to interventional radiology for routine gastrostomy tube replacement in 6 months. Michaelle Birks, MD [facility] Radiology Electronically Signed   By: Michaelle Birks M.D.   On: 08/01/2021 14:57    DG Chest Port 1 View  Result Date: 08/01/2021 CLINICAL DATA:  Postprocedure.  Evaluate for pneumothorax. EXAM: PORTABLE CHEST 1 VIEW COMPARISON:  Portable chest radiograph, 07/27/2021. FINDINGS: Support lines: LEFT IJ tunneled dialysis catheter, with tip within the RIGHT atrium. NG tube, with tip excluded from view. Multiple overlying pacer leads. Cardiomediastinal silhouette is unchanged. Hypoinflation with streaky perihilar opacities. LEFT basilar consolidation silhouetting the cardiac apex. No large pleural effusion or pneumothorax. No interval osseous abnormality IMPRESSION: 1. No postprocedure pneumothorax. 2. Lines and tubes as above. 3. Hypoinflation with perihilar and basilar opacities  favored to represent interstitial edema with atelectasis, though pneumonia could appear similar. Electronically Signed   By: Michaelle Birks M.D.   On: 08/01/2021 15:27   CT IMAGE GUIDED DRAINAGE BY PERCUTANEOUS CATHETER  Result Date: 08/01/2021 INDICATION: Perirenal abscess. EXAM: CT GUIDED DRAINAGE CATHETER PLACEMENT INTO LEFT PERIANAL ABSCESS COMPARISON:  CT Abdomen Pelvis, 07/29/2021. MEDICATIONS: The patient is currently admitted to the hospital and receiving intravenous antibiotics. The antibiotics were administered within an appropriate time frame prior to the initiation of the procedure. ANESTHESIA/SEDATION: Moderate (conscious) sedation was employed during this procedure. A total of Versed 0 mg and Fentanyl 12.5 mcg was administered intravenously. Moderate Sedation Time: 21 minutes. The patient's level of consciousness and vital signs were monitored continuously by radiology nursing throughout the procedure under my direct supervision. CONTRAST:  None COMPLICATIONS: None immediate. PROCEDURE: Informed written consent was obtained from the patient after a discussion of the risks, benefits and alternatives to treatment. The patient was placed supine on the CT gantry and a pre procedural CT was performed  re-demonstrating the known abscess/fluid collection within the LEFT perirenal space. The procedure was planned. A timeout was performed prior to the initiation of the procedure. The LEFT flank was prepped and draped in the usual sterile fashion. The overlying soft tissues were anesthetized with 1% lidocaine with epinephrine. Appropriate trajectory was planned with the use of a 22 gauge spinal needle. An 18 gauge trocar needle was advanced into the abscess/fluid collection and a short Amplatz super stiff wire was coiled within the collection. Appropriate positioning was confirmed with a limited CT scan. The tract was serially dilated allowing placement of a 12 [place] all-purpose drainage catheter. Appropriate positioning was confirmed with a limited postprocedural CT scan. 15 mL of purulent fluid was aspirated. The tube was connected to a drainage bag and sutured in place. A dressing was placed. The patient tolerated the procedure well without immediate post procedural complication. IMPRESSION: Successful CT guided placement of a 73 French all purpose drain catheter into the LEFT pararenal abscess with aspiration of 15 mL of purulent fluid. Samples were sent to the laboratory as requested by the ordering clinical team. Michaelle Birks, MD [facility] Radiology Electronically Signed   By: Michaelle Birks M.D.   On: 08/01/2021 14:49     Medications:    sodium chloride Stopped (07/30/21 2024)   amphotericin B  Conventional (FUNGIZONE) IV 125 mL/hr at 07/31/21 2319   feeding supplement (GLUCERNA 1.5 CAL) Stopped (08/01/21 0000)    sodium chloride   Intravenous Once   sodium chloride   Intravenous Once   acetaminophen (TYLENOL) oral liquid 160 mg/5 mL  650 mg Per Tube Q24H   amLODipine  10 mg Per Tube Daily   chlorhexidine  15 mL Mouth Rinse BID   Chlorhexidine Gluconate Cloth  6 each Topical Q0600   cloNIDine  0.1 mg Per Tube Daily   collagenase   Topical Daily    dextrose  10 mL Intravenous Q24H   dextrose  10 mL Intravenous Q24H   epoetin (EPOGEN/PROCRIT) injection  20,000 Units Subcutaneous Weekly   fentaNYL       free water  125 mL Per Tube Q4H   furosemide  80 mg Intravenous Q12H   glucagon (human recombinant)       haloperidol lactate  1 mg Intravenous Once   heparin injection (subcutaneous)  5,000 Units Subcutaneous Q8H   insulin aspart  0-20 Units Subcutaneous Q4H   insulin aspart  6 Units  Subcutaneous Q4H   insulin detemir  30 Units Subcutaneous BID   mouth rinse  15 mL Mouth Rinse q12n4p   metoprolol tartrate  100 mg Per Tube BID   multivitamin  1 tablet Per Tube QHS   nutrition supplement (JUVEN)  1 packet Per Tube BID BM   ondansetron       pantoprazole (PROTONIX) IV  40 mg Intravenous Q12H   potassium chloride  40 mEq Per Tube Daily   sodium chloride flush  10 mL Intracatheter Q12H     Assessment/ Plan:  Heather Lopez is a 75 y.o. white female with diabetes mellitus type II, hypertension, hyperlipidemia, nephrolithiasis, depression, gout who is admitted to [facility] on 06/27/2021 for Hyperkalemia [E87.5] Hyponatremia [E87.1] ARF (acute renal failure) (HCC) [N17.9] Acute renal failure (ARF) (HCC) [N17.9] Acute respiratory failure with hypoxia ([place]) [J96.01] Demand ischemia of myocardium (HCC) [I24.8] Acute respiratory failure with hypoxemia (HCC) [J96.01] Acute renal failure superimposed on chronic kidney disease, unspecified CKD stage, unspecified acute renal failure type ([place]) [N17.9, N18.9] Sepsis, due to unspecified organism, unspecified whether acute organ dysfunction present [facility]) [A41.9]  Acute Kidney Injury on chronic kidney disease stage IV with baseline creatinine 1.97 and GFR of 25 on 12/22/19.  Acute kidney injury secondary to sepsis and obstructive uropathy. Chronic kidney disease is likely secondary to diabetic nephropathy.Nonoliguric urine output.    -Creatinine continues to trend down slowly.  Currently 2.01.  Good  urine output noted.  No need for dialysis at the moment.  Lab Results  Component Value Date   CREATININE 2.01 (H) 07/31/2021   CREATININE 2.26 (H) 07/29/2021   CREATININE 2.26 (H) 07/28/2021    Intake/Output Summary (Last 24 hours) at 08/01/2021 1546 Last data filed at 08/01/2021 1200 Gross per 24 hour  Intake 1617.27 ml  Output 3550 ml  Net -1932.73 ml    2. Acute Respiratory failure secondary to community acquired pneumonia failing outpatient antibiotics. Febrile.  -Remains on high flow nasal cannula.  Weaning as per pulmonary/critical care.  3. Anemia of chronic kidney disease Normocytic Lab Results  Component Value Date   HGB 7.8 (L) 07/30/2021   -Maintain the patient on Epogen 20,000 units subcutaneous weekly.  4.  Diabetes mellitus type II with chronic kidney disease insulin dependent. Most recent hemoglobin A1c is 8.3% on 06/12/21.  Metformin held    5.Bilateral Nephrolithiasis- with obstructive uropathy and left renal abscess Abscess drained on August 26 Repeat renal ultrasound 9/2-no change in complex fluid collection noted adjacent to the left kidney. CT scan abdomen pelvis without contrast 07/29/2021: Persistent fluid collection in the anterior pararenal space.  This latest fluid collection does not communicate with the prior and will likely require drainage.  6. Hypokalemia: -Serum potassium now up to 3.8 and acceptable.       LOS: 35 Heather Lopez 9/6/20223:46 PM

## 2021-08-02 DIAGNOSIS — N151 Renal and perinephric abscess: Secondary | ICD-10-CM | POA: Diagnosis not present

## 2021-08-02 DIAGNOSIS — N390 Urinary tract infection, site not specified: Secondary | ICD-10-CM | POA: Diagnosis not present

## 2021-08-02 LAB — MAGNESIUM: Magnesium: 2.2 mg/dL (ref 1.7–2.4)

## 2021-08-02 LAB — COMPREHENSIVE METABOLIC PANEL
ALT: 33 U/L (ref 0–44)
AST: 25 U/L (ref 15–41)
Albumin: 2.4 g/dL — ABNORMAL LOW (ref 3.5–5.0)
Alkaline Phosphatase: 115 U/L (ref 38–126)
Anion gap: 12 (ref 5–15)
BUN: 95 mg/dL — ABNORMAL HIGH (ref 8–23)
CO2: 28 mmol/L (ref 22–32)
Calcium: 9.7 mg/dL (ref 8.9–10.3)
Chloride: 106 mmol/L (ref 98–111)
Creatinine, Ser: 2.05 mg/dL — ABNORMAL HIGH (ref 0.44–1.00)
GFR, Estimated: 25 mL/min — ABNORMAL LOW (ref >60)
Glucose, Bld: 242 mg/dL — ABNORMAL HIGH (ref 70–99)
Potassium: 4.1 mmol/L (ref 3.5–5.1)
Sodium: 146 mmol/L — ABNORMAL HIGH (ref 135–145)
Total Bilirubin: 0.7 mg/dL (ref 0.3–1.2)
Total Protein: 6.9 g/dL (ref 6.5–8.1)

## 2021-08-02 LAB — GLUCOSE, CAPILLARY
Glucose-Capillary: 285 mg/dL — ABNORMAL HIGH (ref 70–99)
Glucose-Capillary: 198 mg/dL — ABNORMAL HIGH (ref 70–99)
Glucose-Capillary: 234 mg/dL — ABNORMAL HIGH (ref 70–99)
Glucose-Capillary: 189 mg/dL — ABNORMAL HIGH (ref 70–99)
Glucose-Capillary: 176 mg/dL — ABNORMAL HIGH (ref 70–99)
Glucose-Capillary: 230 mg/dL — ABNORMAL HIGH (ref 70–99)

## 2021-08-02 LAB — CBC WITH DIFFERENTIAL/PLATELET
Abs Immature Granulocytes: 0.18 10*3/uL — ABNORMAL HIGH (ref 0.00–0.07)
Basophils Absolute: 0.1 10*3/uL (ref 0.0–0.1)
Basophils Relative: 1 %
Eosinophils Absolute: 0.2 10*3/uL (ref 0.0–0.5)
Eosinophils Relative: 2 %
HCT: 28.4 % — ABNORMAL LOW (ref 36.0–46.0)
Hemoglobin: 8.7 g/dL — ABNORMAL LOW (ref 12.0–15.0)
Immature Granulocytes: 1 %
Lymphocytes Relative: 11 %
Lymphs Abs: 1.5 10*3/uL (ref 0.7–4.0)
MCH: 28.4 pg (ref 26.0–34.0)
MCHC: 30.6 g/dL (ref 30.0–36.0)
MCV: 92.8 fL (ref 80.0–100.0)
Monocytes Absolute: 0.8 10*3/uL (ref 0.1–1.0)
Monocytes Relative: 6 %
Neutro Abs: 10.5 10*3/uL — ABNORMAL HIGH (ref 1.7–7.7)
Neutrophils Relative %: 79 %
Platelets: 452 10*3/uL — ABNORMAL HIGH (ref 150–400)
RBC: 3.06 MIL/uL — ABNORMAL LOW (ref 3.87–5.11)
RDW: 16.5 % — ABNORMAL HIGH (ref 11.5–15.5)
WBC: 13.3 10*3/uL — ABNORMAL HIGH (ref 4.0–10.5)
nRBC: 0.2 % (ref 0.0–0.2)

## 2021-08-02 LAB — HEPATITIS B SURFACE ANTIGEN: Hepatitis B Surface Ag: NONREACTIVE

## 2021-08-02 MED ORDER — TORSEMIDE 20 MG PO TABS
40.0000 mg | ORAL_TABLET | Freq: Every day | ORAL | Status: DC
  Administered 2021-08-02 – 2021-08-03 (×2): 40 mg
  Filled 2021-08-02 (×2): qty 2

## 2021-08-02 MED ORDER — SODIUM CHLORIDE 0.9 % IV BOLUS FOR AMBISOME
500.0000 mL | INTRAVENOUS | Status: AC
  Administered 2021-08-02: 500 mL via INTRAVENOUS

## 2021-08-02 MED ORDER — GLUCERNA 1.5 CAL PO LIQD
1000.0000 mL | ORAL | Status: DC
  Administered 2021-08-02: 1000 mL

## 2021-08-02 MED ORDER — PANTOPRAZOLE SODIUM 40 MG PO PACK
40.0000 mg | PACK | Freq: Two times a day (BID) | ORAL | Status: DC
  Administered 2021-08-02 – 2021-08-03 (×2): 40 mg
  Filled 2021-08-02 (×2): qty 20

## 2021-08-02 MED ORDER — SODIUM CHLORIDE 0.9 % IV BOLUS FOR AMBISOME
500.0000 mL | INTRAVENOUS | Status: AC
Start: 2021-08-03 — End: 2021-08-03
  Administered 2021-08-03: 500 mL via INTRAVENOUS

## 2021-08-02 MED ORDER — PANTOPRAZOLE SODIUM 40 MG PO PACK: 40.0000 mg | PACK | Freq: Two times a day (BID) | ORAL | Status: DC

## 2021-08-02 NOTE — Progress Notes (Signed)
Nutrition Follow-up  DOCUMENTATION CODES:   Obesity unspecified  INTERVENTION:   Continue Glucerna 1.5 '@60ml' /hr   Free water flushes 18m q4 hours   Regimen provides 2160kcal/day, 119g/day protein and 1843 ml/day free water   Juven Fruit Punch BID via tube, each serving provides 95kcal and 2.5g of protein (amino acids glutamine and arginine)  NUTRITION DIAGNOSIS:   Increased nutrient needs related to acute illness, post-op healing (sepsis, ARF on HD) as evidenced by estimated needs.  GOAL:   Patient will meet greater than or equal to 90% of their needs -met with tube feeds   MONITOR:   Labs, Weight trends, TF tolerance, Skin, I & O's  ASSESSMENT:   75y.o. year old female with PMH of diabetes, CKD III, nephrolithiasis with chronic right hydroureteronephrosis, HLD, depression/anxiety, IBS and gout who is admitted with PNA, ARF and perforated duodenal ulcer now s/p exploratory laparotomy and primary repair of perforated duodenal ulcer with creation of omental flap 8/5.  -Pt s/p cystoscopy and b/l ureteral stent placement 8/10 -Pt s/p CT guided aspiration of perirenal abscess- 5 ml purulent fluid aspirated 8/26 -Pt s/p IR placement of 4F gastrotomy tube 9/6  Pt s/p IR G-tube placement yesterday. Plan is to restart tube feeds at goal rate and for SLP evaluation for pt to be able to eat for pleasure. NGT will be removed. Pt tolerating tube feeds at goal rate prior to NPO for G-tube placement; will resume at goal rate. Per chart, pt is up ~114lbs since admit; RD unsure if bed weights are correct. Pt -11.6L on her I & O's. Pt is noted to have some mild edema. [facility]   Medications reviewed and include: epogen, lasix, haldol, heparin, insulin, juven, protonix, KCl, fungizone, 10% dextrose '@30ml' /hr   Labs reviewed: Na 146(H), K 4.1 wnl, BUN 95(H), creat 2.05(H), P 4.3 wnl, Mg 2.2 wnl Wbc- 13.3(H), Hgb 8.7(L), Hct 28.4(L) Cbgs- 285, 198, 234 x 24 hrs  Diet  Order:   Diet Order             Diet NPO time specified Except for: Ice Chips  Diet effective now                  EDUCATION NEEDS:   Not appropriate for education at this time  Skin:  Skin Assessment: Reviewed RN Assessment (Beneath both breasts and between 4 cm x 32 cmx 0.1 cm, coccygeal wound, Unstageable PI Measurement: 3cm x 2.5cm , incision abdomen)  Last BM:  9/5- type 5  Height:   Ht Readings from Last 1 Encounters:  07/11/21 5' 2.01" (1.575 m)    Weight:   Wt Readings from Last 1 Encounters:  08/01/21 (!) 156.2 kg    Ideal Body Weight:  50 kg  BMI:  Body mass index is 62.97 kg/m.  Estimated Nutritional Needs:   Kcal:  2100-2400 kcal/day  Protein:  110-120 g/day  Fluid:  1.5-1.8L/day  CKoleen DistanceMS, RD, LDN Please refer to [facility] and/or RD on-call/weekend/after hours pager

## 2021-08-02 NOTE — Progress Notes (Signed)
  Speech Language Pathology Treatment: Dysphagia  Patient Details Name: Heather Lopez MRN: [id number] DOB: Jun 21, 1946 Today's Date: 08/02/2021 Time: 9379-0240 SLP Time Calculation (min) (ACUTE ONLY): 45 min  Assessment / Plan / Recommendation Clinical Impression  Pt tolerated po trials of applesauce and ice chips today during session w/out overt clinical s/s of aspiration noted; ANS stable and o2 sats remained 97%+. Pt required full feeding support and cues but did well w/ po's.  Pt now has PEG placed and beginning TFs via PEG. She just weaned down to [place] o2 support at 6-8L off of HFNC this morning.   Recommend therapeutic trials of applesauce along w/ the ice chips(baseline ongoing) as initial introduction of po's in steps toward an oral diet. This was explained to pt/Husband/family and to [place]. Orders placed for therapeutic po's w/ NSG Supervision. ST services will f/u tomorrow per POC.      HPI HPI: Pt is a [name] who presented to the ED on 06/27/21 with chief complaints of progressive shortness of breath, productive cough, loss of appetite, nausea, dry heaving fatigue, malaise and generalized body aches. Patient initially went to her PCP on 7/29 with complaints of rhinorrhea, harsh cough, loss of appetite, nausea, fatigue, malaise and generalized body aches x2 days, patient was diagnosed with pneumonia of which she received ceftriaxone 1 mg IM and sent home with Levaquin, prednisone and Tessalon. On 8/4 pt developed acute abdominal pain with CT showing viscus perforation. Pt urgently taken to the OR for ex lap. Pt is also s/p cystoscopy with B ureteral stent placement for L obstructed kidney stone on 8/10. Pt was orally intubated until 07/14/2021.  CXR: 07/18/21 - Larger lung volumes with regressed but not resolved lung base  atelectasis. No new cardiopulmonary abnormality.  PEG placed on 08/01/2021.      SLP Plan  Continue with current plan of care (therapeutic trials w/ NSG)       Recommendations   Diet recommendations: Other(comment) (therapeutic trials of applesauce and ice chips w/ NSG) Medication Administration: Crushed with puree (if needed vs via PEG) Supervision: Full supervision/cueing for compensatory strategies Compensations: Minimize environmental distractions;Slow rate;Small sips/bites Postural Changes and/or Swallow Maneuvers: Seated upright 90 degrees;Upright 30-60 min after meal                General recommendations:  (dietician following) Oral Care Recommendations: Oral care QID;Oral care prior to ice chip/H20;Staff/trained caregiver to provide oral care;Oral care before and after PO Follow up Recommendations: LTACH;Skilled Nursing facility SLP Visit Diagnosis: Dysphagia, oropharyngeal phase (R13.12) Plan: Continue with current plan of care (therapeutic trials w/ NSG)       GO                 Orinda Kenner, [place], [place] Pathologist Rehab Services [phone]  [facility] 08/02/2021, 3:16 PM

## 2021-08-02 NOTE — TOC Progression Note (Signed)
Transition of Care [facility]) - Progression Note    Patient Details  Name: LAYLONI FAHRNER MRN: [id number] Date of Birth: 03/29/1946  Transition of Care [facility]) CM/SW [place], RN Phone Number: 08/02/2021, 11:45 AM  Clinical Narrative: Spoke with Dr. Roosevelt Locks, patient to discharge to [facility] tomorrow. Called [facility] and husband to inform them of the discharge plan. Husband voices understanding however will discuss eating plans for today.    Expected Discharge Plan: [place] Barriers to Discharge: Continued Medical Work up  Expected Discharge Plan and Services Expected Discharge Plan: [place] In-house Referral: Clinical Social Work   Post Acute Care Choice: [place] Living arrangements for the past 2 months: Single Family Home                                       Social Determinants of Health (SDOH) Interventions    Readmission Risk Interventions No flowsheet data found.

## 2021-08-02 NOTE — Progress Notes (Signed)
Daily Progress Note   Patient Name: Heather Lopez       Date: 08/02/2021 DOB: 06/10/1946  Age: 75 y.o. MRN#: [id number] Attending Physician: Sharen Hones, MD Primary Care Physician: [name], PA-C Admit Date: 06/27/2021  Reason for Consultation/Follow-up: Establishing goals of care  Subjective: Patient is resting in bed with husband at bedside. She speaks to me today and answers questions appropriately. She states she has 3 children and 2 grandchildren. 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   Heather Lopez states she hurts all over, with pain that feels like her typical arthritis pain. She discusses that she has osteoarthritis at baseline, and was losing weight in preparation for a hip surgery. She states she uses Tylenol for pain.   We discussed her status and the current overall plans moving forward, which she is in agreement to. She tells me she is hungry and wants chocolate pudding.   Length of Stay: 36  Current Medications: Scheduled Meds:   sodium chloride   Intravenous Once   sodium chloride   Intravenous Once   acetaminophen (TYLENOL) oral liquid 160 mg/5 mL  650 mg Per Tube Q24H   amLODipine  10 mg Per Tube Daily   chlorhexidine  15 mL Mouth Rinse BID   Chlorhexidine Gluconate Cloth  6 each Topical Q0600   cloNIDine  0.1 mg Per Tube Daily   collagenase   Topical Daily   dextrose  10 mL Intravenous Q24H   dextrose  10 mL Intravenous Q24H   epoetin (EPOGEN/PROCRIT) injection  20,000 Units Subcutaneous Weekly   free water  125 mL Per Tube Q4H   haloperidol lactate  1 mg Intravenous Once   heparin injection (subcutaneous)  5,000 Units Subcutaneous Q8H   insulin aspart  0-20 Units Subcutaneous Q4H   insulin detemir  30 Units Subcutaneous BID   mouth  rinse  15 mL Mouth Rinse q12n4p   metoprolol tartrate  100 mg Per Tube BID   nutrition supplement (JUVEN)  1 packet Per Tube BID BM   pantoprazole sodium  40 mg Per Tube BID   potassium chloride  40 mEq Per Tube Daily   sodium chloride flush  10 mL Intracatheter Q12H   torsemide  40 mg Per Tube Daily    Continuous Infusions:  sodium  chloride Stopped (07/30/21 2024)   amphotericin B  Conventional (FUNGIZONE) IV 125 mL/hr at 08/01/21 2358   dextrose 30 mL/hr at 08/02/21 1000   feeding supplement (GLUCERNA 1.5 CAL)      PRN Meds: sodium chloride, acetaminophen (TYLENOL) oral liquid 160 mg/5 mL, diphenhydrAMINE **OR** diphenhydrAMINE, heparin, hydrALAZINE, lidocaine (PF), lidocaine-prilocaine, lip balm, ondansetron (ZOFRAN) IV, oxyCODONE, pentafluoroprop-tetrafluoroeth  Physical Exam Pulmonary:     Effort: Pulmonary effort is normal.  Neurological:     Mental Status: She is alert.            Vital Signs: BP (!) 140/56 (BP Location: Right Arm)   Pulse 93   Temp 98.8 F (37.1 C) (Oral)   Resp (!) 23   Ht 5' 2.01" (1.575 m)   Wt (!) 156.2 kg   SpO2 96%   BMI 62.97 kg/m  SpO2: SpO2: 96 % O2 Device: O2 Device: High Flow Nasal Cannula O2 Flow Rate: O2 Flow Rate (L/min): 7 L/min  Intake/output summary:  Intake/Output Summary (Last 24 hours) at 08/02/2021 1208 Last data filed at 08/02/2021 1000 Gross per 24 hour  Intake 928.91 ml  Output 2090 ml  Net -1161.09 ml   LBM: Last BM Date:  (flexiseal) Baseline Weight: Weight: 99.8 kg Most recent weight: Weight: (!) 156.2 kg     Patient Active Problem List   Diagnosis Date Noted   Hypokalemia 07/20/2021   Renal abscess    Essential hypertension    Lethargy    Duodenal ulcer perforation (HCC)    Demand ischemia of myocardium (HCC)    Atrial flutter with rapid ventricular response ([place]) 07/07/2021   Endotracheally intubated 07/07/2021   Bilateral nephrolithiasis 07/07/2021   Anemia of chronic disease 07/07/2021   Pressure  injury of skin 06/30/2021   Pneumoperitoneum    Acute respiratory failure with hypoxia (HCC) 06/27/2021   Severe sepsis with septic shock ([place]) 06/27/2021   Acute lower UTI 06/27/2021   Acute kidney injury superimposed on chronic kidney disease ([place]) 06/27/2021   Type 2 diabetes mellitus with stage 4 chronic kidney disease, with long-term current use of insulin ([place]) 03/03/2018   Vitamin D deficiency 08/21/2016   IBS (irritable bowel syndrome) 08/21/2016   Special screening for malignant neoplasms, colon    Benign neoplasm of transverse colon    Benign neoplasm of sigmoid colon    Uncontrolled type 2 diabetes mellitus with insulin therapy ([place]) 07/12/2015   Benign hypertension with CKD (chronic kidney disease) stage III ([place]) 07/12/2015   Hyperlipidemia associated with type 2 diabetes mellitus ([place]) 07/12/2015   Depression 07/12/2015   BMI 40.0-44.9, adult ([place]) 07/12/2015    Palliative Care Assessment & Plan    Recommendations/Plan: Plans for [facility].     Code Status:    Code Status Orders  (From admission, onward)           Start     Ordered   07/12/21 1523  Do not attempt resuscitation (DNR)  Continuous       Question Answer Comment  In the event of cardiac or respiratory ARREST Do not call a "code blue"   In the event of cardiac or respiratory ARREST Do not perform Intubation, CPR, defibrillation or ACLS   In the event of cardiac or respiratory ARREST Use medication by any route, position, wound care, and other measures to relive pain and suffering. May use oxygen, suction and manual treatment of airway obstruction as needed for comfort.      07/12/21 1523  Code Status History     Date Active Date Inactive Code Status Order ID Comments User Context   06/27/2021 0445 07/12/2021 1523 Full Code [id number]  Rise Patience, MD ED      Advance Directive Documentation    Flowsheet Row Most Recent Value  Type of Advance Directive  Healthcare Power of Attorney  Pre-existing out of facility DNR order (yellow form or pink MOST form) --  "MOST" Form in Place? --       Prognosis:  Unable to determine    Thank you for allowing the Palliative Medicine Team to assist in the care of this patient.       Total Time 35 min Prolonged Time Billed  no       Greater than 50%  of this time was spent counseling and coordinating care related to the above assessment and plan.  Asencion Gowda, NP  Please contact Palliative Medicine Team phone at [phone] for questions and concerns.

## 2021-08-02 NOTE — Plan of Care (Signed)
  Problem: Health Behavior/Discharge Planning: Goal: Ability to manage health-related needs will improve Outcome: Progressing   Problem: Clinical Measurements: Goal: Ability to maintain clinical measurements within normal limits will improve Outcome: Progressing Goal: Will remain free from infection Outcome: Progressing Goal: Diagnostic test results will improve Outcome: Progressing Goal: Respiratory complications will improve Outcome: Progressing Goal: Cardiovascular complication will be avoided Outcome: Progressing   Problem: Activity: Goal: Risk for activity intolerance will decrease Outcome: Progressing   Problem: Nutrition: Goal: Adequate nutrition will be maintained Outcome: Progressing   Problem: Coping: Goal: Level of anxiety will decrease Outcome: Progressing   Problem: Elimination: Goal: Will not experience complications related to bowel motility Outcome: Progressing Goal: Will not experience complications related to urinary retention Outcome: Progressing   Problem: Pain Managment: Goal: General experience of comfort will improve Outcome: Progressing   Problem: Safety: Goal: Ability to remain free from injury will improve Outcome: Progressing   Problem: Skin Integrity: Goal: Risk for impaired skin integrity will decrease Outcome: Progressing   Problem: Clinical Measurements: Goal: Ability to maintain clinical measurements within normal limits will improve Outcome: Progressing Goal: Postoperative complications will be avoided or minimized Outcome: Progressing   Problem: Skin Integrity: Goal: Demonstration of wound healing without infection will improve Outcome: Progressing

## 2021-08-02 NOTE — Progress Notes (Addendum)
Referring Physician(s): Sharen Hones, MD   Supervising Physician: Aletta Edouard  Patient Status:  [facility] - In-pt  Reason for visit: Dysphagia s/p G-tube placed by IR 9/6 Left anterior pararenal fluid collection s/p 89F percutaneous drainage catheter placed by IR 9/6  Subjective: Patient denies any complaints since procedure, patient's husband denies any new concerns or issues. Patient is tolerating tube feeds.  Allergies: Contrast media  [iodinated diagnostic agents] and Iodine  Medications: Prior to Admission medications   Medication Sig Start Date End Date Taking? Authorizing Provider  amLODipine (NORVASC) 10 MG tablet Take 1 tablet (10 mg total) by mouth daily. 04/15/18  Yes Mikey College, NP  aspirin EC 81 MG tablet Take 81 mg by mouth daily.   Yes [provider]  atorvastatin (LIPITOR) 20 MG tablet Take 1 tablet (20 mg total) by mouth daily. 04/15/18  Yes Mikey College, NP  benzonatate (TESSALON) 200 MG capsule Take 200 mg by mouth 3 (three) times daily as needed. 06/23/21  Yes [provider]  buPROPion (WELLBUTRIN XL) 150 MG 24 hr tablet Take 1 tablet (150 mg total) by mouth daily. 05/26/18  Yes Mikey College, NP  insulin aspart (NOVOLOG FLEXPEN) 100 UNIT/ML FlexPen Use meal coverage 6 units with every meal plus correction scale three times daily up to total of 35 units per day. 04/15/18  Yes Mikey College, NP  Insulin Pen Needle 32G X 4 MM MISC Inject insulin pens up to 5 times daily 04/15/18  Yes Mikey College, NP  Lancets (FREESTYLE) lancets USE AS INSTRUCTED 06/16/19  Yes Karamalegos, Alexander J, DO  LEVEMIR FLEXTOUCH 100 UNIT/ML Pen INJECT 50 UNITS IN THE MORNING AND 44 UNITS IN THE EVENING Patient taking differently: Inject 52 Units into the skin 2 (two) times daily. 06/30/18  Yes Mikey College, NP  levofloxacin (LEVAQUIN) 500 MG tablet Take 500 mg by mouth daily. 06/23/21  Yes [provider]   losartan (COZAAR) 50 MG tablet Take 1 tablet (50 mg total) by mouth daily. 04/15/18  Yes Mikey College, NP  metFORMIN (GLUCOPHAGE) 1000 MG tablet Take 1 tablet (1,000 mg total) by mouth 2 (two) times daily. 04/15/18  Yes Mikey College, NP  metoprolol tartrate (LOPRESSOR) 25 MG tablet Take 1 tablet (25 mg total) by mouth 2 (two) times daily. 05/26/18  Yes Mikey College, NP  Multiple Vitamin (MULTI-VITAMIN) tablet Take 1 tablet by mouth daily.    Yes [provider]  ondansetron (ZOFRAN-ODT) 4 MG disintegrating tablet Take 4 mg by mouth every 8 (eight) hours as needed. 06/23/21  Yes [provider]  predniSONE (DELTASONE) 20 MG tablet Take 20 mg by mouth 2 (two) times daily. 06/23/21  Yes [provider]  TRULICITY 8.92 JJ/9.4RD SOPN SMARTSIG:0.5 Milliliter(s) SUB-Q Once a Week 06/19/21  Yes [provider]   Vital Signs: BP (!) 123/53 (BP Location: Right Arm)   Pulse 89   Temp (!) 97.4 F (36.3 C) (Oral)   Resp (!) 26   Ht 5' 2.01" (1.575 m)   Wt (!) 344 lb 5.7 oz (156.2 kg)   SpO2 95%   BMI 62.97 kg/m   Physical Exam  General: Alert, NAD, family in room  Abd: Soft, Left sided IR drain intact with minimal seropurulent output in bag  Output by Drain (mL) 07/31/21 0701 - 07/31/21 1900 07/31/21 1901 - 08/01/21 0700 08/01/21 0701 - 08/01/21 1900 08/01/21 1901 - 08/02/21 0700 08/02/21 0701 - 08/02/21 1555  Closed  System Drain 1 Lateral LUQ Bulb (JP) 12 Fr. 0   30    Imaging: CT ABDOMEN PELVIS WO CONTRAST  Result Date: 07/29/2021 CLINICAL DATA:  Pararenal abscess status post percutaneous drainage with persistent fevers. EXAM: CT ABDOMEN AND PELVIS WITHOUT CONTRAST TECHNIQUE: Multidetector CT imaging of the abdomen and pelvis was performed following the standard protocol without IV contrast. COMPARISON:  07/20/2021, drainage procedure 07/21/2021 FINDINGS: Lower chest: Small left pleural effusion is present with subtotal collapse of the left  lower lobe, progressive since prior examination. Mild right basilar atelectasis present. Central venous catheter tip noted within the right atrium. Cardiac size within normal limits. No pericardial effusion. Hepatobiliary: No focal liver abnormality is seen. No gallstones, gallbladder wall thickening, or biliary dilatation. Pancreas: Unremarkable Spleen: Unremarkable Adrenals/Urinary Tract: The adrenal glands are unremarkable. The kidneys are normal in size and position. Nonobstructing caliceal calculi are again identified within the lower pole of the right kidney measuring up to 10 mm and within the left kidney measuring up to 23 mm within the upper pole, unchanged from prior examination. Medullary calcification, possibly post infectious or posttraumatic is again noted within the upper pole of the left kidney. Bilateral ureteral stents are in expected position and previously noted hydronephrosis has resolved on the left and nearly resolved on the right. Foley catheter balloon is seen within a decompressed bladder lumen. Since the prior examination, a percutaneous drainage catheter passes between the ninth and tenth ribs laterally and appears looped within the anterior pararenal space adjacent to the upper pole of the left kidney with complete evacuation of the previously noted pararenal fluid collection in this location. However, the separate curvilinear fluid collection within the anterior pararenal space measuring 10.1 x 2.2 cm by roughly 9.3 cm in craniocaudal dimension is unchanged. This does not appear to communicate with the percutaneous drainage catheter. Stomach/Bowel: Nasogastric tube extends into the mid to distal body of the stomach. Rectal catheter balloon noted within the a rectal vault. Mild sigmoid diverticulosis. The stomach, small bowel, and large bowel are otherwise unremarkable. Appendix normal. No free intraperitoneal gas or fluid. Vascular/Lymphatic: Mild aortoiliac atherosclerotic  calcification. No aortic aneurysm. No pathologic adenopathy within the abdomen and pelvis. Reproductive: Heterogeneous enhancement of the uterine corpus may relate to multiple underlying small uterine fibroids. The pelvic organs are otherwise unremarkable. Other: Dermal thickening involving the a pannus appears stable since prior examination and may be post inflammatory or postsurgical in nature, but is nonspecific. Tiny fat containing umbilical hernia. Musculoskeletal: No acute bone abnormality. Degenerative changes are seen within the lumbar spine. Advanced degenerative changes are seen within the hips bilaterally, right greater than left. No lytic or blastic bone lesion. IMPRESSION: Interval percutaneous drainage of the a perirenal fluid collection adjacent to the upper pole of the left kidney with complete evacuation of this collection. Note that the catheter extends through the left ninth intercostal space. Interval development of a small dependent left pleural effusion. Persistent unchanged curvilinear fluid collection within the left anterior pararenal space measuring up to 10.1 cm in greatest dimension. This does not appear to communicate with the percutaneous drainage catheter placed within the perirenal collection. Stable bilateral nonobstructing nephrolithiasis. No urolithiasis. Bilateral double-J ureteral stents are in expected position with interval near complete resolution of hydronephrosis. Interval Foley decompression of the bladder. Aortic Atherosclerosis (ICD10-I70.0). Electronically Signed   By: Fidela Salisbury M.D.   On: 07/29/2021 21:15   IR GASTROSTOMY TUBE MOD SED  Result Date: 08/01/2021 INDICATION: Dysphagia EXAM: PERC PLACEMENT GASTROSTOMY MEDICATIONS:  None ANESTHESIA/SEDATION: Versed 0 mg IV; Fentanyl 37.5 mcg IV Moderate Sedation Time:  30 minutes The patient was continuously monitored during the procedure by the interventional radiology nurse under my direct supervision. CONTRAST:   Secondary to patient's reported iodinated contrast allergy, gadolinium contrast was used for the procedure-administered into the gastric lumen. FLUOROSCOPY TIME:  Fluoroscopy Time: 2 minutes 54 seconds (33.5 mGy). COMPLICATIONS: None immediate. PROCEDURE: Informed written consent was obtained from the patient and/or patient's representative after a thorough discussion of the procedural risks, benefits and alternatives. All questions were addressed. Maximal Sterile barrier Technique was utilized including caps, mask, sterile gowns, sterile gloves, sterile drape, hand hygiene and skin antiseptic. A timeout was performed prior to the initiation of the procedure. The patient was placed on the procedure table in the supine position. Pre-procedure abdominal film confirmed visualization of the transverse colon. The patient was prepped and draped in usual sterile fashion. The stomach was insufflated with air via the indwelling nasogastric tube. Under fluoroscopy, a puncture site was selected and local analgesia achieved with 1% lidocaine infiltrated subcutaneously. Under fluoroscopic guidance, a gastropexy needle was passed into the stomach and the T-bar suture was released. Entry into the stomach was confirmed with fluoroscopy, aspiration of air, and injection of contrast material. This was repeated with an additional gastropexy suture (for a total of 2 fasteners). At the center of these gastropexy sutures, a dermatotomy was performed. An 18 gauge needle was passed into the stomach at the site of this dermatotomy, and position within the gastric lumen again confirmed under fluoroscopy using aspiration of air and contrast injection. An Amplatz guidewire was passed through this needle and intraluminal placement within the stomach was confirmed by fluoroscopy. The needle was removed. Over the guidewire, a dilator peel-away sheath was introduced and the 18 French gastrostomy tube was placed over wire. The retention balloon of  the percutaneous gastrostomy tube was inflated with 10 mL of sterile water. The tube was withdrawn until the retention balloon was at the edge of the gastric lumen. The external bumper was brought to the abdominal wall. Contrast was injected through the gastrostomy tube, confirming intraluminal positioning. The patient tolerated the procedure well without any immediate post-procedural complications. IMPRESSION: Technically successful placement of 18 Fr gastrostomy tube, as above. RECOMMENDATIONS: The patient is to return to interventional radiology for routine gastrostomy tube replacement in 6 months. Michaelle Birks, MD [facility] Radiology Electronically Signed   By: Michaelle Birks M.D.   On: 08/01/2021 14:57   DG Chest Port 1 View  Result Date: 08/01/2021 CLINICAL DATA:  Postprocedure.  Evaluate for pneumothorax. EXAM: PORTABLE CHEST 1 VIEW COMPARISON:  Portable chest radiograph, 07/27/2021. FINDINGS: Support lines: LEFT IJ tunneled dialysis catheter, with tip within the RIGHT atrium. NG tube, with tip excluded from view. Multiple overlying pacer leads. Cardiomediastinal silhouette is unchanged. Hypoinflation with streaky perihilar opacities. LEFT basilar consolidation silhouetting the cardiac apex. No large pleural effusion or pneumothorax. No interval osseous abnormality IMPRESSION: 1. No postprocedure pneumothorax. 2. Lines and tubes as above. 3. Hypoinflation with perihilar and basilar opacities favored to represent interstitial edema with atelectasis, though pneumonia could appear similar. Electronically Signed   By: Michaelle Birks M.D.   On: 08/01/2021 15:27   CT IMAGE GUIDED DRAINAGE BY PERCUTANEOUS CATHETER  Result Date: 08/01/2021 INDICATION: Perirenal abscess. EXAM: CT GUIDED DRAINAGE CATHETER PLACEMENT INTO LEFT PERIANAL ABSCESS COMPARISON:  CT Abdomen Pelvis, 07/29/2021. MEDICATIONS: The patient is currently admitted to the hospital and receiving  intravenous  antibiotics. The antibiotics were administered within an appropriate time frame prior to the initiation of the procedure. ANESTHESIA/SEDATION: Moderate (conscious) sedation was employed during this procedure. A total of Versed 0 mg and Fentanyl 12.5 mcg was administered intravenously. Moderate Sedation Time: 21 minutes. The patient's level of consciousness and vital signs were monitored continuously by radiology nursing throughout the procedure under my direct supervision. CONTRAST:  None COMPLICATIONS: None immediate. PROCEDURE: Informed written consent was obtained from the patient after a discussion of the risks, benefits and alternatives to treatment. The patient was placed supine on the CT gantry and a pre procedural CT was performed re-demonstrating the known abscess/fluid collection within the LEFT perirenal space. The procedure was planned. A timeout was performed prior to the initiation of the procedure. The LEFT flank was prepped and draped in the usual sterile fashion. The overlying soft tissues were anesthetized with 1% lidocaine with epinephrine. Appropriate trajectory was planned with the use of a 22 gauge spinal needle. An 18 gauge trocar needle was advanced into the abscess/fluid collection and a short Amplatz super stiff wire was coiled within the collection. Appropriate positioning was confirmed with a limited CT scan. The tract was serially dilated allowing placement of a 12 [place] all-purpose drainage catheter. Appropriate positioning was confirmed with a limited postprocedural CT scan. 15 mL of purulent fluid was aspirated. The tube was connected to a drainage bag and sutured in place. A dressing was placed. The patient tolerated the procedure well without immediate post procedural complication. IMPRESSION: Successful CT guided placement of a 7 French all purpose drain catheter into the LEFT pararenal abscess with aspiration of 15 mL of purulent fluid. Samples were sent to the  laboratory as requested by the ordering clinical team. Michaelle Birks, MD [facility] Radiology Electronically Signed   By: Michaelle Birks M.D.   On: 08/01/2021 14:49    Labs:  CBC: Recent Labs    07/27/21 0950 07/28/21 0602 07/30/21 1227 08/02/21 0214  WBC 13.3* 11.2* 9.6 13.3*  HGB 8.2* 7.8* 7.8* 8.7*  HCT 25.2* 25.0* 24.1* 28.4*  PLT 321 355 402* 452*    COAGS: Recent Labs    06/27/21 0032 07/16/21 2217  INR 1.4* 1.2  APTT 33 33    BMP: Recent Labs    07/28/21 0602 07/29/21 0615 07/30/21 0626 07/31/21 0459 08/01/21 0411 08/02/21 0214  NA 144 146*  --  145  --  146*  K 3.5 3.3* 3.8 3.9 3.8 4.1  CL 107 108  --  108  --  106  CO2 28 25  --  26  --  28  GLUCOSE 120* 216*  --  136*  --  242*  BUN 88* 87*  --  97*  --  95*  CALCIUM 9.1 9.3  --  9.6  --  9.7  CREATININE 2.26* 2.26*  --  2.01*  --  2.05*  GFRNONAA 22* 22*  --  26*  --  25*    LIVER FUNCTION TESTS: Recent Labs    07/17/21 0434 07/21/21 0528 07/28/21 0602 07/31/21 0459 08/02/21 0214  BILITOT 0.5 0.5 0.7  --  0.7  AST 52* 33 19  --  25  ALT 88* 53* 31  --  33  ALKPHOS 107 101 98  --  115  PROT 6.0* 5.8* 6.7  --  6.9  ALBUMIN 1.8* 1.6* 2.0* 2.0* 2.4*    Assessment and Plan: Shortness of breath with respiratory distress presented to ED 8/2  Abdominal pain found to have perforated duodenum s/p Ex Lap and surgical repair 8/5 Septic shock, acute respiratory failure AKI, obstructive uropathy secondary to ureteral calculi s/p bilateral ureteral stents 8/10, requiring HD- nephrology following Left peri-renal abscess s/p 65F percutaneous drain catheter placed by IR 8/26- collection resolved on follow-up CT and drain removed 9/6  Dysphagia s/p successful percutaneous gastrostomy tube placement in IR 9/6- tolerating tube feeds today without difficulty, site without complication and patient afebrile.   Left anterior pararenal fluid collection unchanged and  persistent, seen on CT 9/3 s/p 66F IR drain placement 9/6  Wbc trend slightly up 13.3, afebrile, Cx wbc, no growth/pending- continue to follow wbc trend, flush drainage catheter as ordered and monitor daily output. Per chart review, patient may be transferring to L-tach. Patient will need repeat CT imaging in 10-14 days to evaluate abdominal abscess resolution and if drainage catheter can be removed.    Electronically Signed: Hedy Jacob, PA-C 08/02/2021, 3:47 PM   I spent a total of 15 Minutes at the the patient's bedside AND on the patient's hospital floor or unit, greater than 50% of which was counseling/coordinating care for dysphagia and abdominal abscess.

## 2021-08-02 NOTE — Progress Notes (Signed)
PROGRESS NOTE    Heather Lopez  NFA:[id number] DOB: [birth year] DOA: 06/27/2021 PCP: [name], PA-C    Brief Narrative:  Heather Lopez is a 75 y.o. female with history of hypertension, diabetes mellitus, hyperlipidemia has been feeling weak for the last 3 to 4 days.  Patient had a acute renal failure with creatinine of 9.3, potassium 7.5, acute metabolic acidosis with pH 7.15, procalcitonin 6.83.  Patient was diagnosed was septic shock, acute respiratory failure. On night of 8/4, patient developed acute abdominal pain, CT scan showed acute abdomen secondary to perforated duodenum.  Patient was placed on mechanical ventilation after duodenum repair. Patient had a bilateral hydronephrosis, ureteral stents was placed on 8/10. Patient urine culture was positive for Candida glabrata.  ID consult was obtained, patient currently on high-dose of Diflucan. 8/26. Due to persistent fever, CT scan of abdomen/pelvis was repeated on 8/26, aspiration pararenal fluids was obtained, removed 5 mL of pus.  ID changed antibiotic to Amphotericin in addition to Zosyn Patient had an left perirenal abscess drain placed on 9/6, PEG tube was also placed.  Bilateral hydronephrosis has resolved.  Patient is continued on Amphotericin per ID.   Assessment & Plan:   Active Problems:   Uncontrolled type 2 diabetes mellitus with insulin therapy ([place])   Hyperlipidemia associated with type 2 diabetes mellitus ([place])   Type 2 diabetes mellitus with stage 4 chronic kidney disease, with long-term current use of insulin (HCC)   Acute respiratory failure with hypoxia (HCC)   Severe sepsis with septic shock (HCC)   Acute lower UTI   Acute kidney injury superimposed on chronic kidney disease (HCC)   Pneumoperitoneum   Pressure injury of skin   Atrial flutter with rapid ventricular response (HCC)   Endotracheally intubated   Bilateral nephrolithiasis   Anemia of chronic disease   Demand ischemia of myocardium (HCC)   Lethargy    Duodenal ulcer perforation ([place])   Renal abscess   Essential hypertension   Hypokalemia  Acute metabolic encephalopathy. Sepsis with septic shock secondary to obstructive uropathy, kidney stone.  Status post bilateral ureteral stents. Candida urinary tract infection Perirenal abscess. Perforated duodenum. S/p repair. Patient condition had improved, no additional fever. Continue Amphotericin. Patient may be able to transfer to [facility] tomorrow.  Acute hypoxemic respiratory failure secondary to septic shock. Morbid obesity and obesity hypoventilation syndrome  Patient condition continued to improve.  Oxygenation gradually getting better.  She is currently receiving Lasix 80 mg twice a day, will discontinue and switch to oral torsemide 40 mg daily.   Iron deficiency anemia Acute blood loss anemia. Ruled in Upper GI bleed. Ruled in.  Condition has been stable.  Changed her Protonix to tube feeding.  Uncontrolled type 2 diabetes with hyperglycemia. Patient had hypoglycemia yesterday afternoon due to n.p.o. status postprocedure.  Glucose running high again today, will resume feeding.  Dysphagia. Continue tube feeding.  Followed by speech therapist.   DVT prophylaxis: heparin Code Status: dnr Family Communication:  Disposition Plan:    Status is: Inpatient  Remains inpatient appropriate because:Inpatient level of care appropriate due to severity of illness  Dispo: The patient is from: Home              Anticipated d/c is to: LTAC              Patient currently is not medically stable to d/c.   Difficult to place patient No        I/O last 3 completed shifts: In: 1366.1 [  I.V.:298.6; Other:40; NG/GT:30; IV Piggyback:997.5] Out: 5643 [PIRJJ:8841; Drains:30; Stool:75] Total I/O In: 90.1 [I.V.:90.1] Out: 51 [Urine:335]     Consultants:  Nephrology, iD  Procedures: PEG  Antimicrobials: Amphotericin.  Subjective: Patient feels tired this morning, but slept well  last night.  Oxygenation is better, currently down to 7 L oxygen.  Short of breath with exertion. No abdominal pain or nausea vomiting. No fever chills pain No dysuria hematuria. No headache or dizziness. No chest pain or palpitation.  Objective: Vitals:   08/02/21 0400 08/02/21 0500 08/02/21 0733 08/02/21 0800  BP: (!) 158/67   (!) 140/56  Pulse: 92 92  93  Resp: 18 11  (!) 23  Temp: 98.1 F (36.7 C)   98.8 F (37.1 C)  TempSrc:    Oral  SpO2: 99% 97% 97% 96%  Weight:      Height:        Intake/Output Summary (Last 24 hours) at 08/02/2021 1153 Last data filed at 08/02/2021 1000 Gross per 24 hour  Intake 928.91 ml  Output 3090 ml  Net -2161.09 ml   Filed Weights   07/29/21 0500 07/30/21 0500 08/01/21 0500  Weight: (!) 148 kg (!) 149.5 kg (!) 156.2 kg    Examination:  General exam: Appears calm and comfortable  Respiratory system: Clear to auscultation. Respiratory effort normal. Cardiovascular system: S1 & S2 heard, RRR. No JVD, murmurs, rubs, gallops or clicks. No pedal edema. Gastrointestinal system: Abdomen is nondistended, soft and nontender. No organomegaly or masses felt. Normal bowel sounds heard. Central nervous system: Alert and oriented x2. No focal neurological deficits. Extremities: Symmetric 5 x 5 power. Skin: No rashes, lesions or ulcers Psychiatry: Mood & affect appropriate.     Data Reviewed: I have personally reviewed following labs and imaging studies  CBC: Recent Labs  Lab 07/27/21 0950 07/28/21 0602 07/30/21 1227 08/02/21 0214  WBC 13.3* 11.2* 9.6 13.3*  NEUTROABS  --   --   --  10.5*  HGB 8.2* 7.8* 7.8* 8.7*  HCT 25.2* 25.0* 24.1* 28.4*  MCV 90.0 89.3 92.3 92.8  PLT 321 355 402* 660*   Basic Metabolic Panel: Recent Labs  Lab 07/27/21 0649 07/27/21 0649 07/28/21 0602 07/29/21 0615 07/30/21 0626 07/31/21 0459 08/01/21 0411 08/02/21 0214  NA 142  --  144 146*  --  145  --  146*  K 3.3*  --  3.5 3.3* 3.8 3.9 3.8 4.1  CL 105  --   107 108  --  108  --  106  CO2 23  --  28 25  --  26  --  28  GLUCOSE 148*  --  120* 216*  --  136*  --  242*  BUN 85*  --  88* 87*  --  97*  --  95*  CREATININE 2.38*  --  2.26* 2.26*  --  2.01*  --  2.05*  CALCIUM 9.1  --  9.1 9.3  --  9.6  --  9.7  MG  --    < > 2.0 2.1 2.2 2.2 2.4 2.2  PHOS  --   --  4.5 3.9 4.3 3.6  3.7 4.3  --    < > = values in this interval not displayed.   GFR: Estimated Creatinine Clearance: 35.2 mL/min (A) (by C-G formula based on SCr of 2.05 mg/dL (H)). Liver Function Tests: Recent Labs  Lab 07/28/21 0602 07/31/21 0459 08/02/21 0214  AST 19  --  25  ALT 31  --  33  ALKPHOS 98  --  115  BILITOT 0.7  --  0.7  PROT 6.7  --  6.9  ALBUMIN 2.0* 2.0* 2.4*   No results for input(s): LIPASE, AMYLASE in the last 168 hours. Recent Labs  Lab 07/28/21 0602  AMMONIA 11   Coagulation Profile: No results for input(s): INR, PROTIME in the last 168 hours. Cardiac Enzymes: No results for input(s): CKTOTAL, CKMB, CKMBINDEX, TROPONINI in the last 168 hours. BNP (last 3 results) No results for input(s): PROBNP in the last 8760 hours. HbA1C: No results for input(s): HGBA1C in the last 72 hours. CBG: Recent Labs  Lab 08/01/21 1943 08/01/21 2324 08/02/21 0356 08/02/21 0732 08/02/21 1123  GLUCAP 159* 181* 285* 198* 234*   Lipid Profile: No results for input(s): CHOL, HDL, LDLCALC, TRIG, CHOLHDL, LDLDIRECT in the last 72 hours. Thyroid Function Tests: No results for input(s): TSH, T4TOTAL, FREET4, T3FREE, THYROIDAB in the last 72 hours. Anemia Panel: No results for input(s): VITAMINB12, FOLATE, FERRITIN, TIBC, IRON, RETICCTPCT in the last 72 hours. Sepsis Labs: No results for input(s): PROCALCITON, LATICACIDVEN in the last 168 hours.  Recent Results (from the past 240 hour(s))  CULTURE, BLOOD (ROUTINE X 2) w Reflex to ID Panel     Status: Abnormal   Collection Time: 07/27/21  4:55 PM   Specimen: BLOOD  Result Value Ref Range Status   Specimen  Description   Final    BLOOD BLOOD LEFT HAND Performed at [facility], [address], [place], [place] [address]    Special Requests   Final    BOTTLES DRAWN AEROBIC AND ANAEROBIC Blood Culture results may not be optimal due to an inadequate volume of blood received in culture bottles Performed at [facility], [address], [place], [place] [address]    Culture  Setup Time   Final    Organism ID to follow [place] TO, READ BACK BY AND VERIFIED WITH: [place] Performed at [facility], [address], [place], [place] [address]    Culture (A)  Final    STAPHYLOCOCCUS EPIDERMIDIS THE SIGNIFICANCE OF ISOLATING THIS ORGANISM FROM A SINGLE SET OF BLOOD CULTURES WHEN MULTIPLE SETS ARE DRAWN IS UNCERTAIN. PLEASE NOTIFY THE MICROBIOLOGY DEPARTMENT WITHIN ONE WEEK IF SPECIATION AND SENSITIVITIES ARE REQUIRED. Performed at [facility], [place] [address], [place], [place] [address]    Report Status 07/31/2021 FINAL  Final  Blood Culture ID Panel (Reflexed)     Status: Abnormal   Collection Time: 07/27/21  4:55 PM  Result Value Ref Range Status   Enterococcus faecalis NOT DETECTED NOT DETECTED Final   Enterococcus Faecium NOT DETECTED NOT DETECTED Final   Listeria monocytogenes NOT DETECTED NOT DETECTED Final   Staphylococcus species DETECTED (A) NOT DETECTED Final    Comment: CRITICAL RESULT CALLED TO, READ BACK BY AND VERIFIED WITH: SUZANE WATSON AT 1940 ON 07/28/21 BY SS    Staphylococcus aureus (BCID) NOT DETECTED NOT DETECTED Final   Staphylococcus epidermidis DETECTED (A) NOT DETECTED Final    Comment: Methicillin (oxacillin) resistant coagulase negative staphylococcus. Possible blood culture contaminant (unless isolated from more than one blood culture draw or clinical case suggests pathogenicity). No antibiotic treatment is indicated for blood  culture contaminants. CRITICAL  RESULT CALLED TO, READ BACK BY AND VERIFIED WITH: [place] ON 07/28/21 BY SS    Staphylococcus lugdunensis NOT DETECTED NOT DETECTED Final  Streptococcus species NOT DETECTED NOT DETECTED Final   Streptococcus agalactiae NOT DETECTED NOT DETECTED Final   Streptococcus pneumoniae NOT DETECTED NOT DETECTED Final   Streptococcus pyogenes NOT DETECTED NOT DETECTED Final   A.calcoaceticus-baumannii NOT DETECTED NOT DETECTED Final   Bacteroides fragilis NOT DETECTED NOT DETECTED Final   Enterobacterales NOT DETECTED NOT DETECTED Final   Enterobacter cloacae complex NOT DETECTED NOT DETECTED Final   Escherichia coli NOT DETECTED NOT DETECTED Final   Klebsiella aerogenes NOT DETECTED NOT DETECTED Final   Klebsiella oxytoca NOT DETECTED NOT DETECTED Final   Klebsiella pneumoniae NOT DETECTED NOT DETECTED Final   Proteus species NOT DETECTED NOT DETECTED Final   Salmonella species NOT DETECTED NOT DETECTED Final   Serratia marcescens NOT DETECTED NOT DETECTED Final   Haemophilus influenzae NOT DETECTED NOT DETECTED Final   Neisseria meningitidis NOT DETECTED NOT DETECTED Final   Pseudomonas aeruginosa NOT DETECTED NOT DETECTED Final   Stenotrophomonas maltophilia NOT DETECTED NOT DETECTED Final   Candida albicans NOT DETECTED NOT DETECTED Final   Candida auris NOT DETECTED NOT DETECTED Final   Candida glabrata NOT DETECTED NOT DETECTED Final   Candida krusei NOT DETECTED NOT DETECTED Final   Candida parapsilosis NOT DETECTED NOT DETECTED Final   Candida tropicalis NOT DETECTED NOT DETECTED Final   Cryptococcus neoformans/gattii NOT DETECTED NOT DETECTED Final   Methicillin resistance mecA/C DETECTED (A) NOT DETECTED Final    Comment: CRITICAL RESULT CALLED TO, READ BACK BY AND VERIFIED WITH: SUZANE WATSON AT 1940 ON 07/28/21 BY SS Performed at [facility], [address] [place], [place] [address]   CULTURE, BLOOD (ROUTINE X 2) w Reflex to ID Panel     Status: None    Collection Time: 07/27/21  4:56 PM   Specimen: BLOOD  Result Value Ref Range Status   Specimen Description BLOOD BLOOD RIGHT HAND  Final   Special Requests   Final    BOTTLES DRAWN AEROBIC AND ANAEROBIC Blood Culture adequate volume   Culture   Final    NO GROWTH 5 DAYS Performed at [facility], [address] [place], [place] [address]    Report Status 08/01/2021 FINAL  Final  CULTURE, BLOOD (ROUTINE X 2) w Reflex to ID Panel     Status: None (Preliminary result)   Collection Time: 07/28/21  8:59 PM   Specimen: BLOOD  Result Value Ref Range Status   Specimen Description BLOOD BLOOD RIGHT HAND  Final   Special Requests   Final    BOTTLES DRAWN AEROBIC AND ANAEROBIC Blood Culture results may not be optimal due to an inadequate volume of blood received in culture bottles   Culture  Setup Time   Final    TEST CANCELLED PER MD Dr. Delaine Lame Performed at [facility], [address] [place], [place] [address]    Report Status PENDING  Incomplete  Aerobic/Anaerobic Culture w Gram Stain (surgical/deep wound)     Status: None (Preliminary result)   Collection Time: 08/01/21  1:12 PM   Specimen: Abscess  Result Value Ref Range Status   Specimen Description   Final    ABSCESS Performed at [facility], [address], [place], [place] [address]    Special Requests   Final    LEFT PERI RENAL ABSCESS Performed at [facility], [address] [place], [place] [address]    Gram Stain   Final    ABUNDANT WBC PRESENT,BOTH PMN AND MONONUCLEAR NO ORGANISMS SEEN  Culture   Final    NO GROWTH < 24 HOURS Performed at [facility], [place] [address], [place], [place] [address]    Report Status PENDING  Incomplete         Radiology Studies: IR GASTROSTOMY TUBE MOD SED  Result Date: 08/01/2021 INDICATION: Dysphagia EXAM: PERC PLACEMENT GASTROSTOMY MEDICATIONS: None ANESTHESIA/SEDATION: Versed 0 mg IV; Fentanyl 37.5 mcg IV Moderate  Sedation Time:  30 minutes The patient was continuously monitored during the procedure by the interventional radiology nurse under my direct supervision. CONTRAST:  Secondary to patient's reported iodinated contrast allergy, gadolinium contrast was used for the procedure-administered into the gastric lumen. FLUOROSCOPY TIME:  Fluoroscopy Time: 2 minutes 54 seconds (33.5 mGy). COMPLICATIONS: None immediate. PROCEDURE: Informed written consent was obtained from the patient and/or patient's representative after a thorough discussion of the procedural risks, benefits and alternatives. All questions were addressed. Maximal Sterile barrier Technique was utilized including caps, mask, sterile gowns, sterile gloves, sterile drape, hand hygiene and skin antiseptic. A timeout was performed prior to the initiation of the procedure. The patient was placed on the procedure table in the supine position. Pre-procedure abdominal film confirmed visualization of the transverse colon. The patient was prepped and draped in usual sterile fashion. The stomach was insufflated with air via the indwelling nasogastric tube. Under fluoroscopy, a puncture site was selected and local analgesia achieved with 1% lidocaine infiltrated subcutaneously. Under fluoroscopic guidance, a gastropexy needle was passed into the stomach and the T-bar suture was released. Entry into the stomach was confirmed with fluoroscopy, aspiration of air, and injection of contrast material. This was repeated with an additional gastropexy suture (for a total of 2 fasteners). At the center of these gastropexy sutures, a dermatotomy was performed. An 18 gauge needle was passed into the stomach at the site of this dermatotomy, and position within the gastric lumen again confirmed under fluoroscopy using aspiration of air and contrast injection. An Amplatz guidewire was passed through this needle and intraluminal placement within the stomach was confirmed by fluoroscopy. The  needle was removed. Over the guidewire, a dilator peel-away sheath was introduced and the 18 French gastrostomy tube was placed over wire. The retention balloon of the percutaneous gastrostomy tube was inflated with 10 mL of sterile water. The tube was withdrawn until the retention balloon was at the edge of the gastric lumen. The external bumper was brought to the abdominal wall. Contrast was injected through the gastrostomy tube, confirming intraluminal positioning. The patient tolerated the procedure well without any immediate post-procedural complications. IMPRESSION: Technically successful placement of 18 Fr gastrostomy tube, as above. RECOMMENDATIONS: The patient is to return to interventional radiology for routine gastrostomy tube replacement in 6 months. Michaelle Birks, MD [facility] Radiology Electronically Signed   By: Michaelle Birks M.D.   On: 08/01/2021 14:57   DG Chest Port 1 View  Result Date: 08/01/2021 CLINICAL DATA:  Postprocedure.  Evaluate for pneumothorax. EXAM: PORTABLE CHEST 1 VIEW COMPARISON:  Portable chest radiograph, 07/27/2021. FINDINGS: Support lines: LEFT IJ tunneled dialysis catheter, with tip within the RIGHT atrium. NG tube, with tip excluded from view. Multiple overlying pacer leads. Cardiomediastinal silhouette is unchanged. Hypoinflation with streaky perihilar opacities. LEFT basilar consolidation silhouetting the cardiac apex. No large pleural effusion or pneumothorax. No interval osseous abnormality IMPRESSION: 1. No postprocedure pneumothorax. 2. Lines and tubes as above. 3. Hypoinflation with perihilar and basilar opacities favored to represent interstitial edema with atelectasis, though pneumonia could appear similar.  Electronically Signed   By: Michaelle Birks M.D.   On: 08/01/2021 15:27   CT IMAGE GUIDED DRAINAGE BY PERCUTANEOUS CATHETER  Result Date: 08/01/2021 INDICATION: Perirenal abscess. EXAM: CT GUIDED DRAINAGE CATHETER  PLACEMENT INTO LEFT PERIANAL ABSCESS COMPARISON:  CT Abdomen Pelvis, 07/29/2021. MEDICATIONS: The patient is currently admitted to the hospital and receiving intravenous antibiotics. The antibiotics were administered within an appropriate time frame prior to the initiation of the procedure. ANESTHESIA/SEDATION: Moderate (conscious) sedation was employed during this procedure. A total of Versed 0 mg and Fentanyl 12.5 mcg was administered intravenously. Moderate Sedation Time: 21 minutes. The patient's level of consciousness and vital signs were monitored continuously by radiology nursing throughout the procedure under my direct supervision. CONTRAST:  None COMPLICATIONS: None immediate. PROCEDURE: Informed written consent was obtained from the patient after a discussion of the risks, benefits and alternatives to treatment. The patient was placed supine on the CT gantry and a pre procedural CT was performed re-demonstrating the known abscess/fluid collection within the LEFT perirenal space. The procedure was planned. A timeout was performed prior to the initiation of the procedure. The LEFT flank was prepped and draped in the usual sterile fashion. The overlying soft tissues were anesthetized with 1% lidocaine with epinephrine. Appropriate trajectory was planned with the use of a 22 gauge spinal needle. An 18 gauge trocar needle was advanced into the abscess/fluid collection and a short Amplatz super stiff wire was coiled within the collection. Appropriate positioning was confirmed with a limited CT scan. The tract was serially dilated allowing placement of a 12 [place] all-purpose drainage catheter. Appropriate positioning was confirmed with a limited postprocedural CT scan. 15 mL of purulent fluid was aspirated. The tube was connected to a drainage bag and sutured in place. A dressing was placed. The patient tolerated the procedure well without immediate post procedural complication. IMPRESSION: Successful CT guided  placement of a 13 French all purpose drain catheter into the LEFT pararenal abscess with aspiration of 15 mL of purulent fluid. Samples were sent to the laboratory as requested by the ordering clinical team. Michaelle Birks, MD [facility] Radiology Electronically Signed   By: Michaelle Birks M.D.   On: 08/01/2021 14:49        Scheduled Meds:  sodium chloride   Intravenous Once   sodium chloride   Intravenous Once   acetaminophen (TYLENOL) oral liquid 160 mg/5 mL  650 mg Per Tube Q24H   amLODipine  10 mg Per Tube Daily   chlorhexidine  15 mL Mouth Rinse BID   Chlorhexidine Gluconate Cloth  6 each Topical Q0600   cloNIDine  0.1 mg Per Tube Daily   collagenase   Topical Daily   dextrose  10 mL Intravenous Q24H   dextrose  10 mL Intravenous Q24H   epoetin (EPOGEN/PROCRIT) injection  20,000 Units Subcutaneous Weekly   free water  125 mL Per Tube Q4H   haloperidol lactate  1 mg Intravenous Once   heparin injection (subcutaneous)  5,000 Units Subcutaneous Q8H   insulin aspart  0-20 Units Subcutaneous Q4H   insulin detemir  30 Units Subcutaneous BID   mouth rinse  15 mL Mouth Rinse q12n4p   metoprolol tartrate  100 mg Per Tube BID   nutrition supplement (JUVEN)  1 packet Per Tube BID BM   pantoprazole (PROTONIX) IV  40 mg Intravenous Q12H   potassium chloride  40 mEq Per Tube Daily   sodium chloride flush  10 mL Intracatheter Q12H  torsemide  40 mg Per Tube Daily   Continuous Infusions:  sodium chloride Stopped (07/30/21 2024)   amphotericin B  Conventional (FUNGIZONE) IV 125 mL/hr at 08/01/21 2358   dextrose 30 mL/hr at 08/02/21 1000   feeding supplement (GLUCERNA 1.5 CAL)       LOS: 36 days    Time spent: 32 minutes    Sharen Hones, MD [facility]   To contact the attending provider between 7A-7P or the covering provider during after hours 7P-7A, please log into the web site [email or web address] and access using universal [facility] password for that web site. If you do not have the password, please call the hospital operator.  08/02/2021, 11:53 AM

## 2021-08-02 NOTE — Progress Notes (Signed)
Date of Admission:  06/27/2021    ID: Heather Lopez is a 75 y.o. female  Active Problems:   Uncontrolled type 2 diabetes mellitus with insulin therapy ([place])   Hyperlipidemia associated with type 2 diabetes mellitus ([place])   Type 2 diabetes mellitus with stage 4 chronic kidney disease, with long-term current use of insulin (HCC)   Acute respiratory failure with hypoxia (HCC)   Severe sepsis with septic shock (HCC)   Acute lower UTI   Acute kidney injury superimposed on chronic kidney disease (HCC)   Pneumoperitoneum   Pressure injury of skin   Atrial flutter with rapid ventricular response ([place])   Endotracheally intubated   Bilateral nephrolithiasis   Anemia of chronic disease   Demand ischemia of myocardium (HCC)   Lethargy   Duodenal ulcer perforation (HCC)   Renal abscess   Essential hypertension   Hypokalemia    Subjective: Pt underwent peg and left renal abscess drain placed on 05/31/21 Doing better More alert, worked with PT Now tired and sleeping  Medications:   sodium chloride   Intravenous Once   sodium chloride   Intravenous Once   acetaminophen (TYLENOL) oral liquid 160 mg/5 mL  650 mg Per Tube Q24H   amLODipine  10 mg Per Tube Daily   chlorhexidine  15 mL Mouth Rinse BID   Chlorhexidine Gluconate Cloth  6 each Topical Q0600   cloNIDine  0.1 mg Per Tube Daily   collagenase   Topical Daily   dextrose  10 mL Intravenous Q24H   dextrose  10 mL Intravenous Q24H   epoetin (EPOGEN/PROCRIT) injection  20,000 Units Subcutaneous Weekly   free water  125 mL Per Tube Q4H   haloperidol lactate  1 mg Intravenous Once   heparin injection (subcutaneous)  5,000 Units Subcutaneous Q8H   insulin aspart  0-20 Units Subcutaneous Q4H   insulin detemir  30 Units Subcutaneous BID   mouth rinse  15 mL Mouth Rinse q12n4p   metoprolol tartrate  100 mg Per Tube BID   nutrition supplement (JUVEN)  1 packet Per Tube BID BM   pantoprazole sodium  40 mg Per Tube BID   potassium chloride   40 mEq Per Tube Daily   sodium chloride flush  10 mL Intracatheter Q12H   torsemide  40 mg Per Tube Daily    Objective: Vital signs in last 24 hours: Temp:  [97.4 F (36.3 C)-98.8 F (37.1 C)] 97.4 F (36.3 C) (09/07 1200) Pulse Rate:  [89-101] 89 (09/07 1200) Resp:  [11-28] 26 (09/07 1200) BP: (123-158)/(53-74) 123/53 (09/07 1200) SpO2:  [90 %-99 %] 95 % (09/07 1200)  PHYSICAL EXAM:  General: sleeping  Head: Normocephalic, without obvious abnormality, atraumatic. Lungs: Clear to auscultation bilaterally. No Wheezing or Rhonchi. No rales. Heart: irregular Abdomen: Soft, left flank drain PEG foley Extremities: atraumatic, no cyanosis. No edema. No clubbing Left IJ HD catheter Skin: No rashes or lesions. Or bruising Lymph: Cervical, supraclavicular normal. Neurologic: Grossly non-focal  Lab Results Recent Labs    07/31/21 0459 08/01/21 0411 08/02/21 0214  WBC  --   --  13.3*  HGB  --   --  8.7*  HCT  --   --  28.4*  NA 145  --  146*  K 3.9 3.8 4.1  CL 108  --  106  CO2 26  --  28  BUN 97*  --  95*  CREATININE 2.01*  --  2.05*   Liver Panel Recent Labs  07/31/21 0459 08/02/21 0214  PROT  --  6.9  ALBUMIN 2.0* 2.4*  AST  --  25  ALT  --  33  ALKPHOS  --  115  BILITOT  --  0.7   Sedimentation Rate No results for input(s): ESRSEDRATE in the last 72 hours. C-Reactive Protein No results for input(s): CRP in the last 72 hours.  Microbiology:  Studies/Results: IR GASTROSTOMY TUBE MOD SED  Result Date: 08/01/2021 INDICATION: Dysphagia EXAM: PERC PLACEMENT GASTROSTOMY MEDICATIONS: None ANESTHESIA/SEDATION: Versed 0 mg IV; Fentanyl 37.5 mcg IV Moderate Sedation Time:  30 minutes The patient was continuously monitored during the procedure by the interventional radiology nurse under my direct supervision. CONTRAST:  Secondary to patient's reported iodinated contrast allergy, gadolinium contrast was used for the procedure-administered into the gastric lumen.  FLUOROSCOPY TIME:  Fluoroscopy Time: 2 minutes 54 seconds (33.5 mGy). COMPLICATIONS: None immediate. PROCEDURE: Informed written consent was obtained from the patient and/or patient's representative after a thorough discussion of the procedural risks, benefits and alternatives. All questions were addressed. Maximal Sterile barrier Technique was utilized including caps, mask, sterile gowns, sterile gloves, sterile drape, hand hygiene and skin antiseptic. A timeout was performed prior to the initiation of the procedure. The patient was placed on the procedure table in the supine position. Pre-procedure abdominal film confirmed visualization of the transverse colon. The patient was prepped and draped in usual sterile fashion. The stomach was insufflated with air via the indwelling nasogastric tube. Under fluoroscopy, a puncture site was selected and local analgesia achieved with 1% lidocaine infiltrated subcutaneously. Under fluoroscopic guidance, a gastropexy needle was passed into the stomach and the T-bar suture was released. Entry into the stomach was confirmed with fluoroscopy, aspiration of air, and injection of contrast material. This was repeated with an additional gastropexy suture (for a total of 2 fasteners). At the center of these gastropexy sutures, a dermatotomy was performed. An 18 gauge needle was passed into the stomach at the site of this dermatotomy, and position within the gastric lumen again confirmed under fluoroscopy using aspiration of air and contrast injection. An Amplatz guidewire was passed through this needle and intraluminal placement within the stomach was confirmed by fluoroscopy. The needle was removed. Over the guidewire, a dilator peel-away sheath was introduced and the 18 French gastrostomy tube was placed over wire. The retention balloon of the percutaneous gastrostomy tube was inflated with 10 mL of sterile water. The tube was withdrawn until the retention balloon was at the edge  of the gastric lumen. The external bumper was brought to the abdominal wall. Contrast was injected through the gastrostomy tube, confirming intraluminal positioning. The patient tolerated the procedure well without any immediate post-procedural complications. IMPRESSION: Technically successful placement of 18 Fr gastrostomy tube, as above. RECOMMENDATIONS: The patient is to return to interventional radiology for routine gastrostomy tube replacement in 6 months. Michaelle Birks, MD [facility] Radiology Electronically Signed   By: Michaelle Birks M.D.   On: 08/01/2021 14:57   DG Chest Port 1 View  Result Date: 08/01/2021 CLINICAL DATA:  Postprocedure.  Evaluate for pneumothorax. EXAM: PORTABLE CHEST 1 VIEW COMPARISON:  Portable chest radiograph, 07/27/2021. FINDINGS: Support lines: LEFT IJ tunneled dialysis catheter, with tip within the RIGHT atrium. NG tube, with tip excluded from view. Multiple overlying pacer leads. Cardiomediastinal silhouette is unchanged. Hypoinflation with streaky perihilar opacities. LEFT basilar consolidation silhouetting the cardiac apex. No large pleural effusion or pneumothorax. No interval osseous abnormality IMPRESSION: 1. No postprocedure pneumothorax.  2. Lines and tubes as above. 3. Hypoinflation with perihilar and basilar opacities favored to represent interstitial edema with atelectasis, though pneumonia could appear similar. Electronically Signed   By: Michaelle Birks M.D.   On: 08/01/2021 15:27   CT IMAGE GUIDED DRAINAGE BY PERCUTANEOUS CATHETER  Result Date: 08/01/2021 INDICATION: Perirenal abscess. EXAM: CT GUIDED DRAINAGE CATHETER PLACEMENT INTO LEFT PERIANAL ABSCESS COMPARISON:  CT Abdomen Pelvis, 07/29/2021. MEDICATIONS: The patient is currently admitted to the hospital and receiving intravenous antibiotics. The antibiotics were administered within an appropriate time frame prior to the initiation of the procedure.  ANESTHESIA/SEDATION: Moderate (conscious) sedation was employed during this procedure. A total of Versed 0 mg and Fentanyl 12.5 mcg was administered intravenously. Moderate Sedation Time: 21 minutes. The patient's level of consciousness and vital signs were monitored continuously by radiology nursing throughout the procedure under my direct supervision. CONTRAST:  None COMPLICATIONS: None immediate. PROCEDURE: Informed written consent was obtained from the patient after a discussion of the risks, benefits and alternatives to treatment. The patient was placed supine on the CT gantry and a pre procedural CT was performed re-demonstrating the known abscess/fluid collection within the LEFT perirenal space. The procedure was planned. A timeout was performed prior to the initiation of the procedure. The LEFT flank was prepped and draped in the usual sterile fashion. The overlying soft tissues were anesthetized with 1% lidocaine with epinephrine. Appropriate trajectory was planned with the use of a 22 gauge spinal needle. An 18 gauge trocar needle was advanced into the abscess/fluid collection and a short Amplatz super stiff wire was coiled within the collection. Appropriate positioning was confirmed with a limited CT scan. The tract was serially dilated allowing placement of a 12 [place] all-purpose drainage catheter. Appropriate positioning was confirmed with a limited postprocedural CT scan. 15 mL of purulent fluid was aspirated. The tube was connected to a drainage bag and sutured in place. A dressing was placed. The patient tolerated the procedure well without immediate post procedural complication. IMPRESSION: Successful CT guided placement of a 40 French all purpose drain catheter into the LEFT pararenal abscess with aspiration of 15 mL of purulent fluid. Samples were sent to the laboratory as requested by the ordering clinical team. Michaelle Birks, MD [facility]  Radiology Electronically Signed   By: Michaelle Birks M.D.   On: 08/01/2021 14:49     Assessment/Plan: B/l renal stones, B/l hydronephrosis , B/l stent placement Now with left renal and perirenal abscess due to candida [place]- had a drain placed left kidney on 8/26 and it was removed yesterday and a new drain placed in anterior collection Was on regular dose fluconazole, then high dose and then changed to Amphotericin . Day 12 -- will continue for a total of 14 days and stop Fever has resolved  Leucocytosis today could be a stress response to  the procedures she had yesterday   1of 4 staph epi in blood culture is a contaminant- not treated  Duodenal perf- s/p lap and closure  AKI on CKD on intermittent dialysis   Anemia   Discussed the management with the care team

## 2021-08-02 NOTE — Evaluation (Signed)
Occupational Therapy Re-evaluation Patient Details Name: TELSA DILLAVOU MRN: [id number] DOB: [birth year] Today's Date: 08/02/2021    History of Present Illness Pt is a [name] who presented to the ED on 06/27/21 with chief complaints of progressive shortness of breath, productive cough, loss of appetite, nausea, dry heaving fatigue, malaise and generalized body aches. Patient initially went to her PCP on 7/29 with complaints of rhinorrhea, harsh cough, loss of appetite, nausea, fatigue, malaise and generalized body aches x2 days, patient was diagnosed with pneumonia of which she received ceftriaxone 1 mg IM and sent home with Levaquin, prednisone and Tessalon. On 8/4 pt developed acute abdominal pain with CT showing viscus perforation. Pt urgently taken to the OR for ex lap. Pt is also s/p cystoscopy with B ureteral stent placement for L obstructed kidney stone on 8/10.   Clinical Impression   Pt seen for OT re-evaluation this date in setting of prolonged hospitalization. Upon arrival to room, pt awake and seated upright in bed with husband present. Since initial evaluation, pt demonstrates improved cognition, improved strength of b/l UE, and improved participation in bed-level ADLs. This date, pt required MOD A for feeding self ice cubes and MOD A for washing face while in high folwer's position in bed. Pt also engaged in HEP consisting of self ROM of shoulder flexion, elbow flex/extension, wrist flex/extension, and digit flex/extension. Pt is making good progress towards goals, and goals have been updated to reflect current functional abilities. Pt would benefit from additional skilled OT services to maximize return to PLOF and minimize risk of future falls, injury, caregiver burden, and readmission. Discharge plan remains appropriate.    Follow Up Recommendations  LTACH;Supervision/Assistance - 24 hour    Equipment Recommendations  Hospital bed       Precautions / Restrictions Precautions Precautions:  Fall Precaution Comments: PEG tube, abdominal incision & JP drains, foley catheter, fecal tube, L jugular tunneled hemodialysis catheter Restrictions Weight Bearing Restrictions: No       ADL either performed or assessed with clinical judgement   ADL Overall ADL's : Needs assistance/impaired Eating/Feeding: Moderate assistance;Bed level Eating/Feeding Details (indicate cue type and reason): In high fowler's position with spoon loaded and placed in LUE, pt able to bring spoon to mouth to feed self ice 3x before becoming fatigued Grooming: Wash/dry face;Moderate assistance;Bed level Grooming Details (indicate cue type and reason): Pt able to use LUE to bring washcloth to face/RUE. With proximal joint support of RUE, pt able to bring washcloth to chin and bring RUE to midline for hand washing. MOD A required for thoroughness of face/hand washing.                                      Pertinent Vitals/Pain Pain Assessment: No/denies pain        Extremity/Trunk Assessment Upper Extremity Assessment Upper Extremity Assessment: RUE deficits/detail;LUE deficits/detail RUE Deficits / Details: grossly 2-/5 deltoid, 2/5 biceps and grip LUE Deficits / Details: grossly 3-/5 deltoid, 3/5 biceps and grip   Lower Extremity Assessment Lower Extremity Assessment: Defer to PT evaluation          Cognition Arousal/Alertness: Awake/alert Behavior During Therapy: Flat affect Overall Cognitive Status: Impaired/Different from baseline Area of Impairment: Orientation;Memory;Attention;Following commands                 Orientation Level: Disoriented to;Time;Situation Current Attention Level: Focused Memory: Decreased short-term memory;Decreased recall of precautions  Following Commands: Follows one step commands consistently;Follows one step commands with increased time Safety/Judgement: Decreased awareness of safety;Decreased awareness of deficits Awareness:  Intellectual Problem Solving: Slow processing;Decreased initiation;Difficulty sequencing;Requires verbal cues;Requires tactile cues General Comments: Pt alert throughout and able to state name of husband and accurately report numebr of years married. Pt able to follow 1 step commands consistently this date. Self-limiting at times and requiring MOD encouragement      Exercises General Exercises - Upper Extremity Shoulder Flexion: AAROM;Both;10 reps;Supine Elbow Flexion: Strengthening;AAROM;Both;10 reps;Supine Elbow Extension: Strengthening;AAROM;Both;10 reps;Supine Wrist Flexion: AAROM;Strengthening;Both;10 reps;Supine Wrist Extension: AAROM;Both;10 reps;Supine Digit Composite Flexion: AAROM;Both;10 reps;Supine Composite Extension: AAROM;Both;10 reps;Supine          OT Problem List: Decreased strength;Decreased range of motion;Decreased activity tolerance;Impaired balance (sitting and/or standing);Decreased safety awareness;Cardiopulmonary status limiting activity      OT Treatment/Interventions: Therapeutic exercise;Self-care/ADL training;Energy conservation;DME and/or AE instruction;Cognitive remediation/compensation;Therapeutic activities;Patient/family education;Balance training    OT Goals(Current goals can be found in the care plan section) Acute Rehab OT Goals Patient Stated Goal: to go to rehab OT Goal Formulation: With patient Time For Goal Achievement: 08/16/21 Potential to Achieve Goals: Fair ADL Goals Pt Will Perform Eating: with set-up;with supervision;bed level Pt Will Perform Upper Body Dressing: with mod assist;bed level  OT Frequency: Min 1X/week    AM-PAC OT "6 Clicks" Daily Activity     Outcome Measure Help from another person eating meals?: A Lot Help from another person taking care of personal grooming?: A Lot Help from another person toileting, which includes using toliet, bedpan, or urinal?: Total Help from another person bathing (including washing,  rinsing, drying)?: Total Help from another person to put on and taking off regular upper body clothing?: A Lot Help from another person to put on and taking off regular lower body clothing?: A Lot 6 Click Score: 10   End of Session Equipment Utilized During Treatment: Oxygen Nurse Communication: Mobility status  Activity Tolerance: Patient tolerated treatment well Patient left: in bed;with call bell/phone within reach;with bed alarm set;with family/visitor present  OT Visit Diagnosis: Muscle weakness (generalized) (M62.81)                Time: 6468-0321 OT Time Calculation (min): 30 min Charges:  OT General Charges $OT Visit: 1 Visit OT Evaluation $OT Re-eval: 1 Re-eval OT Treatments $Self Care/Home Management : 8-22 mins $Therapeutic Activity: 8-22 mins  Fredirick Maudlin, OTR/L [place]

## 2021-08-02 NOTE — Progress Notes (Signed)
[facility]  ROUNDING NOTE   Subjective:   Heather Lopez is a 75 y.o. white female who was admitted for community acquired pneumonia on 06/27/2021     Hospital course complicated by duodenal perforation s/p repair on 06/30/2021 Left obstructive hydronephrosis and renal abscess, Patient underwent cystoscopy and b/l ureteral stent placement 8/10 8/26- CT guided aspiration of perirenal abscess- 5 ml purulent fluid aspirated   Lab Results  Component Value Date   CREATININE 2.05 (H) 08/02/2021   CREATININE 2.01 (H) 07/31/2021   CREATININE 2.26 (H) 07/29/2021   09/06 0701 - 09/07 0700 In: 838.8 [I.V.:298.6; NG/GT:30; IV Piggyback:500.2] Out: 2755 [Urine:2725; Drains:30]   -Renal function continues to slowly improve Creatinine 2.05 today, BUN 95 Urine output 2757ml High flow nasal cannula.   Objective:  Vital signs in last 24 hours:  Temp:  [98.1 F (36.7 C)-98.8 F (37.1 C)] 98.8 F (37.1 C) (09/07 0800) Pulse Rate:  [86-101] 93 (09/07 0800) Resp:  [11-28] 23 (09/07 0800) BP: (98-158)/(56-104) 140/56 (09/07 0800) SpO2:  [8 %-99 %] 96 % (09/07 0800)  Weight change:  Filed Weights   07/29/21 0500 07/30/21 0500 08/01/21 0500  Weight: (!) 148 kg (!) 149.5 kg (!) 156.2 kg    Intake/Output: I/O last 3 completed shifts: In: 1366.1 [I.V.:298.6; Other:40; NG/GT:30; IV Piggyback:997.5] Out: 9735 [HGDJM:4268; Drains:30; Stool:75]   Intake/Output this shift:  Total I/O In: 30 [I.V.:30] Out: -   Physical Exam: General: No acute distress  Head: HFNC O2, NGT in place  Eyes: Anicteric  Lungs:  +coarse breath sounds  Heart: S1S2 on rubs  Abdomen:  Soft, NT  Extremities: + peripheral edema   Neurologic: Awake, alert, follows commands  GU Foley with yellow urine  Access:  LIJ permcath 8/18  Rectal tube  Basic Metabolic Panel: Recent Labs  Lab 07/27/21 0649 07/27/21 0649 07/28/21 0602 07/29/21 0615 07/30/21 0626 07/31/21 0459 08/01/21 0411 08/02/21 0214  NA  142  --  144 146*  --  145  --  146*  K 3.3*  --  3.5 3.3* 3.8 3.9 3.8 4.1  CL 105  --  107 108  --  108  --  106  CO2 23  --  28 25  --  26  --  28  GLUCOSE 148*  --  120* 216*  --  136*  --  242*  BUN 85*  --  88* 87*  --  97*  --  95*  CREATININE 2.38*  --  2.26* 2.26*  --  2.01*  --  2.05*  CALCIUM 9.1  --  9.1 9.3  --  9.6  --  9.7  MG  --    < > 2.0 2.1 2.2 2.2 2.4 2.2  PHOS  --   --  4.5 3.9 4.3 3.6  3.7 4.3  --    < > = values in this interval not displayed.     Liver Function Tests: Recent Labs  Lab 07/28/21 0602 07/31/21 0459 08/02/21 0214  AST 19  --  25  ALT 31  --  33  ALKPHOS 98  --  115  BILITOT 0.7  --  0.7  PROT 6.7  --  6.9  ALBUMIN 2.0* 2.0* 2.4*    No results for input(s): LIPASE, AMYLASE in the last 168 hours. Recent Labs  Lab 07/28/21 0602  AMMONIA 11     CBC: Recent Labs  Lab 07/27/21 0950 07/28/21 0602 07/30/21 1227 08/02/21 0214  WBC 13.3* 11.2*  9.6 13.3*  NEUTROABS  --   --   --  10.5*  HGB 8.2* 7.8* 7.8* 8.7*  HCT 25.2* 25.0* 24.1* 28.4*  MCV 90.0 89.3 92.3 92.8  PLT 321 355 402* 452*     Cardiac Enzymes: No results for input(s): CKTOTAL, CKMB, CKMBINDEX, TROPONINI in the last 168 hours.  BNP: Invalid input(s): POCBNP  CBG: Recent Labs  Lab 08/01/21 1911 08/01/21 1943 08/01/21 2324 08/02/21 0356 08/02/21 0732  GLUCAP 57* 159* 181* 285* 198*     Microbiology: Results for orders placed or performed during the hospital encounter of 06/27/21  Resp Panel by RT-PCR (Flu A&B, Covid) Nasopharyngeal Swab     Status: None   Collection Time: 06/27/21 12:32 AM   Specimen: Nasopharyngeal Swab; Nasopharyngeal(NP) swabs in vial transport medium  Result Value Ref Range Status   SARS Coronavirus 2 by RT PCR NEGATIVE NEGATIVE Final    Comment: (NOTE) SARS-CoV-2 target nucleic acids are NOT DETECTED.  The SARS-CoV-2 RNA is generally detectable in upper respiratory specimens during the acute phase of infection. The  lowest concentration of SARS-CoV-2 viral copies this assay can detect is 138 copies/mL. A negative result does not preclude SARS-Cov-2 infection and should not be used as the sole basis for treatment or other patient management decisions. A negative result may occur with  improper specimen collection/handling, submission of specimen other than nasopharyngeal swab, presence of viral mutation(s) within the areas targeted by this assay, and inadequate number of viral copies(<138 copies/mL). A negative result must be combined with clinical observations, patient history, and epidemiological information. The expected result is Negative.  Fact Sheet for Patients:  EntrepreneurPulse.com.au  Fact Sheet for Healthcare Providers:  IncredibleEmployment.be  This test is no t yet approved or cleared by the [place] FDA and  has been authorized for detection and/or diagnosis of SARS-CoV-2 by FDA under an Emergency Use Authorization (EUA). This EUA will remain  in effect (meaning this test can be used) for the duration of the COVID-19 declaration under Section 564(b)(1) of the Act, 21 U.S.C.section 360bbb-3(b)(1), unless the authorization is terminated  or revoked sooner.       Influenza A by PCR NEGATIVE NEGATIVE Final   Influenza B by PCR NEGATIVE NEGATIVE Final    Comment: (NOTE) The Xpert Xpress SARS-CoV-2/FLU/RSV plus assay is intended as an aid in the diagnosis of influenza from Nasopharyngeal swab specimens and should not be used as a sole basis for treatment. Nasal washings and aspirates are unacceptable for Xpert Xpress SARS-CoV-2/FLU/RSV testing.  Fact Sheet for Patients: EntrepreneurPulse.com.au  Fact Sheet for Healthcare Providers: IncredibleEmployment.be  This test is not yet approved or cleared by the [place] FDA and has been authorized for detection and/or diagnosis of SARS-CoV-2 by FDA under  an Emergency Use Authorization (EUA). This EUA will remain in effect (meaning this test can be used) for the duration of the COVID-19 declaration under Section 564(b)(1) of the Act, 21 U.S.C. section 360bbb-3(b)(1), unless the authorization is terminated or revoked.  Performed at [facility], [address] [place], [place] [address]   Blood culture (routine x 2)     Status: None   Collection Time: 06/27/21 12:43 AM   Specimen: BLOOD  Result Value Ref Range Status   Specimen Description BLOOD RIGHT ASSIST CONTROL  Final   Special Requests   Final    BOTTLES DRAWN AEROBIC AND ANAEROBIC Blood Culture results may not be optimal due to an inadequate volume of blood received in culture bottles  Culture   Final    NO GROWTH 5 DAYS Performed at [facility], [address] [place], [place] [address]    Report Status 07/02/2021 FINAL  Final  Blood culture (routine x 2)     Status: None   Collection Time: 06/27/21  1:45 AM   Specimen: BLOOD  Result Value Ref Range Status   Specimen Description BLOOD RIGHT ASSIST CONTROL  Final   Special Requests   Final    BOTTLES DRAWN AEROBIC AND ANAEROBIC Blood Culture adequate volume   Culture   Final    NO GROWTH 5 DAYS Performed at [facility], [address] [place], [place] [address]    Report Status 07/02/2021 FINAL  Final  Urine Culture     Status: Abnormal   Collection Time: 06/27/21  2:39 AM   Specimen: In/Out Cath Urine  Result Value Ref Range Status   Specimen Description   Final    IN/OUT CATH URINE Performed at [facility], [address], [place], [place] [address]    Special Requests   Final    NONE Performed at [facility], [place], [place] [address]    Culture 30,000 COLONIES/mL YEAST (A)  Final   Report Status 06/28/2021 FINAL  Final  MRSA Next Gen by PCR, Nasal     Status: None   Collection Time: 06/30/21  5:01 AM   Specimen: Nasal Mucosa; Nasal  Swab  Result Value Ref Range Status   MRSA by PCR Next Gen NOT DETECTED NOT DETECTED Final    Comment: (NOTE) The GeneXpert MRSA Assay (FDA approved for NASAL specimens only), is one component of a comprehensive MRSA colonization surveillance program. It is not intended to diagnose MRSA infection nor to guide or monitor treatment for MRSA infections. Test performance is not FDA approved in patients less than 27 years old. Performed at [facility], [address], [place], [place] [address]   Urine Culture     Status: Abnormal   Collection Time: 07/05/21 10:36 AM   Specimen: PATH Other; Urine  Result Value Ref Range Status   Specimen Description   Final    URINE, RANDOM LEFT RENAL PELVIS URINE Performed at [facility], [address] [place], [place] [address]    Special Requests   Final    NONE Performed at [facility], [address] [place], [place] [address]    Culture 70,000 [place] (A)  Final   Report Status 07/12/2021 FINAL  Final  Urine Culture     Status: Abnormal   Collection Time: 07/05/21 10:48 AM   Specimen: PATH Other; Urine  Result Value Ref Range Status   Specimen Description   Final    URINE, RANDOM RIGHT KIDNEY URINE Performed at [facility], [address] [place], [place] [address]    Special Requests   Final    NONE Performed at [facility], [address], [place], [place] [address]    Culture (A)  Final    >=100,000 COLONIES/mL CANDIDA GLABRATA SEE SEPARATE REPORT Performed at [facility], [place] [address] [place],  [address]    Report Status 07/17/2021 FINAL  Final  Antifungal AST 9 Drug Panel     Status: None   Collection Time: 07/05/21 10:48 AM  Result Value Ref Range Status   Organism ID, Yeast Candida glabrata  Corrected    Comment: (NOTE) Identification performed by account, not confirmed by this  laboratory. CORRECTED ON 08/21 AT 1535: PREVIOUSLY  REPORTED AS Preliminary report    Amphotericin B MIC 0.5 ug/mL  Final    Comment: (NOTE) Breakpoints have been established for only some organism-drug combinations as indicated. This test was developed and its performance characteristics determined by [facility]. It has not been cleared or approved by the Food and Drug Administration.    Anidulafungin MIC Comment  Final    Comment: (NOTE) 0.03 ug/mL Susceptible Breakpoints have been established for only some organism-drug combinations as indicated. This test was developed and its performance characteristics determined by [facility]. It has not been cleared or approved by the Food and Drug Administration.    Caspofungin MIC Comment  Final    Comment: (NOTE) 0.06 ug/mL Susceptible Breakpoints have been established for only some organism-drug combinations as indicated. This test was developed and its performance characteristics determined by [facility]. It has not been cleared or approved by the Food and Drug Administration.    Micafungin MIC Comment  Final    Comment: (NOTE) 0.016 ug/mL Susceptible Breakpoints have been established for only some organism-drug combinations as indicated. This test was developed and its performance characteristics determined by [facility]. It has not been cleared or approved by the Food and Drug Administration.    Posaconazole MIC 0.25 ug/mL  Final    Comment: (NOTE) Breakpoints have been established for only some organism-drug combinations as indicated. This test was developed and its performance characteristics determined by [facility]. It has not been cleared or approved by the Food and Drug Administration.    Fluconazole Islt MIC 2.0 ug/mL  Final    Comment: (NOTE) Susceptible Dose Dependent Breakpoints have been established for only some organism-drug combinations as indicated. This test was developed and its performance characteristics determined by [facility]. It has not been cleared or  approved by the Food and Drug Administration.    Flucytosine MIC 0.06 ug/mL or less  Final    Comment: (NOTE) Breakpoints have been established for only some organism-drug combinations as indicated. This test was developed and its performance characteristics determined by [facility]. It has not been cleared or approved by the Food and Drug Administration.    Itraconazole MIC 0.12 ug/mL  Final    Comment: (NOTE) Breakpoints have been established for only some organism-drug combinations as indicated. This test was developed and its performance characteristics determined by [facility]. It has not been cleared or approved by the Food and Drug Administration.    Voriconazole MIC 0.06 ug/mL  Final    Comment: (NOTE) Breakpoints have been established for only some organism-drug combinations as indicated. This test was developed and its performance characteristics determined by [facility]. It has not been cleared or approved by the Food and Drug Administration. Performed At: [facility] [address] [place], [place] [id number] Rush Farmer MD AQ:[id number]    Source CANDIDA GLABRATA SUSCEPTIBILITY URINE CULTURE  Final    Comment: Performed at [facility], [place] [address], [place], [place] [address]  CULTURE, BLOOD (ROUTINE X 2) w Reflex to ID Panel     Status: None   Collection Time: 07/15/21  7:57 PM   Specimen: BLOOD  Result Value Ref Range Status   Specimen Description BLOOD LEFT ASSIST CONTROL  Final   Special Requests   Final    BOTTLES DRAWN AEROBIC AND ANAEROBIC Blood Culture adequate volume   Culture   Final    NO GROWTH 5 DAYS Performed at [facility], [address] [place], [place] [address]  Report Status 07/20/2021 FINAL  Final  CULTURE, BLOOD (ROUTINE X 2) w Reflex to ID Panel     Status: None   Collection Time: 07/15/21  8:07 PM   Specimen: BLOOD  Result Value Ref Range Status   Specimen Description BLOOD LEFT FOREARM  Final   Special  Requests   Final    BOTTLES DRAWN AEROBIC AND ANAEROBIC Blood Culture adequate volume   Culture   Final    NO GROWTH 5 DAYS Performed at [facility], [address], [place], [place] [address]    Report Status 07/20/2021 FINAL  Final  Body fluid culture w Gram Stain     Status: None   Collection Time: 07/21/21  1:23 PM   Specimen: Peritoneal Washings; Body Fluid  Result Value Ref Range Status   Specimen Description   Final    PERITONEAL Performed at [facility], [address] [place], [place] [address]    Special Requests   Final    NONE Performed at [facility], [place], [place] [address]    Gram Stain   Final    MODERATE WBC PRESENT,BOTH PMN AND MONONUCLEAR NO ORGANISMS SEEN    Culture   Final    RARE CANDIDA GLABRATA Performed at [facility] SEE RESULTS IN SEPERATE REPORT Performed at [facility], [place] [address], [place], [place] [address]    Report Status 07/31/2021 FINAL  Final  Antifungal AST 9 Drug Panel     Status: None   Collection Time: 07/21/21  1:23 PM  Result Value Ref Range Status   Organism ID, Yeast Candida glabrata  Corrected    Comment: (NOTE) Identification performed by account, not confirmed by this laboratory. CORRECTED ON 09/04 AT 1435: PREVIOUSLY REPORTED AS Preliminary report    Amphotericin B MIC 0.5 ug/mL  Final    Comment: (NOTE) Breakpoints have been established for only some organism-drug combinations as indicated. This test was developed and its performance characteristics determined by [facility]. It has not been cleared or approved by the Food and Drug Administration.    Anidulafungin MIC Comment  Final    Comment: (NOTE) 0.03 ug/mL Susceptible Breakpoints have been established for only some organism-drug combinations as indicated. This test was developed and its performance characteristics determined by [facility]. It has not been cleared or approved by the Food and Drug  Administration.    Caspofungin MIC Comment  Final    Comment: (NOTE) 0.06 ug/mL Susceptible Breakpoints have been established for only some organism-drug combinations as indicated. This test was developed and its performance characteristics determined by [facility]. It has not been cleared or approved by the Food and Drug Administration.    Micafungin MIC Comment  Final    Comment: (NOTE) 0.016 ug/mL Susceptible Breakpoints have been established for only some organism-drug combinations as indicated. This test was developed and its performance characteristics determined by [facility]. It has not been cleared or approved by the Food and Drug Administration.    Posaconazole MIC 0.25 ug/mL  Final    Comment: (NOTE) Breakpoints have been established for only some organism-drug combinations as indicated. This test was developed and its performance characteristics determined by [facility]. It has not been cleared or approved by the Food and Drug Administration.    Fluconazole Islt MIC 4.0 ug/mL  Final    Comment: (NOTE) Susceptible Dose Dependent Breakpoints have been established for only some organism-drug combinations as indicated. This test was developed and its performance characteristics determined by  [facility]. It has not been cleared or approved by the Food and Drug Administration.    Flucytosine MIC 0.06 ug/mL or less  Final    Comment: (NOTE) Breakpoints have been established for only some organism-drug combinations as indicated. This test was developed and its performance characteristics determined by [facility]. It has not been cleared or approved by the Food and Drug Administration.    Itraconazole MIC 0.25 ug/mL  Final    Comment: (NOTE) Breakpoints have been established for only some organism-drug combinations as indicated. This test was developed and its performance characteristics determined by [facility]. It has not been cleared or approved by the Food and Drug  Administration.    Voriconazole MIC 0.06 ug/mL  Final    Comment: (NOTE) Breakpoints have been established for only some organism-drug combinations as indicated. This test was developed and its performance characteristics determined by [facility]. It has not been cleared or approved by the Food and Drug Administration. Performed At: [facility] [place], [place] [id number] Rush Farmer MD YD:[id number]    Source CANDIDA GLABRATA/ PERITONEAL FLUID  Final    Comment: Performed at [facility], [place] [address] [place], [place] [address]  Aerobic/Anaerobic Culture w Gram Stain (surgical/deep wound)     Status: None   Collection Time: 07/21/21  3:32 PM   Specimen: Abdomen  Result Value Ref Range Status   Specimen Description   Final    ABDOMEN Performed at [facility], [address], [place], [place] [address]    Special Requests   Final    NONE Performed at [facility], [address] [place], [place] [address]    Gram Stain   Final    MODERATE SQUAMOUS EPITHELIAL CELLS PRESENT WBC PRESENT, PREDOMINANTLY PMN NO ORGANISMS SEEN    Culture   Final    RARE CANDIDA GLABRATA NO ANAEROBES ISOLATED Performed at [facility], [place] [address] [place], [place] [address]    Report Status 07/26/2021 FINAL  Final  CULTURE, BLOOD (ROUTINE X 2) w Reflex to ID Panel     Status: Abnormal   Collection Time: 07/27/21  4:55 PM   Specimen: BLOOD  Result Value Ref Range Status   Specimen Description   Final    BLOOD BLOOD LEFT HAND Performed at [facility], [address], [place], [place] [address]    Special Requests   Final    BOTTLES DRAWN AEROBIC AND ANAEROBIC Blood Culture results may not be optimal due to an inadequate volume of blood received in culture bottles Performed at [facility], [address], [place], [place] [address]    Culture  Setup Time   Final    Organism ID to follow [facility] CRITICAL RESULT CALLED TO, READ BACK BY AND VERIFIED WITH: [place] Performed at [facility], [address], [place], [place] [address]    Culture (A)  Final    STAPHYLOCOCCUS EPIDERMIDIS THE SIGNIFICANCE OF ISOLATING THIS ORGANISM FROM A SINGLE SET OF BLOOD CULTURES WHEN MULTIPLE SETS ARE DRAWN IS UNCERTAIN. PLEASE NOTIFY THE MICROBIOLOGY DEPARTMENT WITHIN ONE WEEK IF SPECIATION AND SENSITIVITIES ARE REQUIRED. Performed at [facility], [place] [address], [place], [place] [address]    Report Status 07/31/2021 FINAL  Final  Blood Culture ID Panel (Reflexed)     Status: Abnormal   Collection Time: 07/27/21  4:55 PM  Result Value Ref Range Status  Enterococcus faecalis NOT DETECTED NOT DETECTED Final   Enterococcus Faecium NOT DETECTED NOT DETECTED Final   Listeria monocytogenes NOT DETECTED NOT DETECTED Final   Staphylococcus species DETECTED (A) NOT DETECTED Final    Comment: CRITICAL RESULT CALLED TO, READ BACK BY AND VERIFIED WITH: SUZANE WATSON AT 1940 ON 07/28/21 BY SS    Staphylococcus aureus (BCID) NOT DETECTED NOT DETECTED Final   Staphylococcus epidermidis DETECTED (A) NOT DETECTED Final    Comment: Methicillin (oxacillin) resistant coagulase negative staphylococcus. Possible blood culture contaminant (unless isolated from more than one blood culture draw or clinical case suggests pathogenicity). No antibiotic treatment is indicated for blood  culture contaminants. CRITICAL RESULT CALLED TO, READ BACK BY AND VERIFIED WITH: [place] ON 07/28/21 BY SS    Staphylococcus lugdunensis NOT DETECTED NOT DETECTED Final   Streptococcus species NOT DETECTED NOT DETECTED Final   Streptococcus agalactiae NOT DETECTED NOT DETECTED Final   Streptococcus pneumoniae NOT DETECTED NOT DETECTED Final   Streptococcus pyogenes NOT DETECTED NOT DETECTED Final   A.calcoaceticus-baumannii NOT DETECTED NOT DETECTED Final    Bacteroides fragilis NOT DETECTED NOT DETECTED Final   Enterobacterales NOT DETECTED NOT DETECTED Final   Enterobacter cloacae complex NOT DETECTED NOT DETECTED Final   Escherichia coli NOT DETECTED NOT DETECTED Final   Klebsiella aerogenes NOT DETECTED NOT DETECTED Final   Klebsiella oxytoca NOT DETECTED NOT DETECTED Final   Klebsiella pneumoniae NOT DETECTED NOT DETECTED Final   Proteus species NOT DETECTED NOT DETECTED Final   Salmonella species NOT DETECTED NOT DETECTED Final   Serratia marcescens NOT DETECTED NOT DETECTED Final   Haemophilus influenzae NOT DETECTED NOT DETECTED Final   Neisseria meningitidis NOT DETECTED NOT DETECTED Final   Pseudomonas aeruginosa NOT DETECTED NOT DETECTED Final   Stenotrophomonas maltophilia NOT DETECTED NOT DETECTED Final   Candida albicans NOT DETECTED NOT DETECTED Final   Candida auris NOT DETECTED NOT DETECTED Final   Candida glabrata NOT DETECTED NOT DETECTED Final   Candida krusei NOT DETECTED NOT DETECTED Final   Candida parapsilosis NOT DETECTED NOT DETECTED Final   Candida tropicalis NOT DETECTED NOT DETECTED Final   Cryptococcus neoformans/gattii NOT DETECTED NOT DETECTED Final   Methicillin resistance mecA/C DETECTED (A) NOT DETECTED Final    Comment: CRITICAL RESULT CALLED TO, READ BACK BY AND VERIFIED WITH: [place] ON 07/28/21 BY SS Performed at [facility], [address] [place], [place] [address]   CULTURE, BLOOD (ROUTINE X 2) w Reflex to ID Panel     Status: None   Collection Time: 07/27/21  4:56 PM   Specimen: BLOOD  Result Value Ref Range Status   Specimen Description BLOOD BLOOD RIGHT HAND  Final   Special Requests   Final    BOTTLES DRAWN AEROBIC AND ANAEROBIC Blood Culture adequate volume   Culture   Final    NO GROWTH 5 DAYS Performed at [facility], [address] [place], [place] [address]    Report Status 08/01/2021 FINAL  Final  CULTURE, BLOOD (ROUTINE X 2) w Reflex to ID Panel      Status: None (Preliminary result)   Collection Time: 07/28/21  8:59 PM   Specimen: BLOOD  Result Value Ref Range Status   Specimen Description BLOOD BLOOD RIGHT HAND  Final   Special Requests   Final    BOTTLES DRAWN AEROBIC AND ANAEROBIC Blood Culture results may not be optimal due to an inadequate volume of blood received in culture bottles  Culture  Setup Time   Final    TEST CANCELLED PER MD Dr. Delaine Lame Performed at [facility], [address] [place], [place] [address]    Report Status PENDING  Incomplete  Aerobic/Anaerobic Culture w Gram Stain (surgical/deep wound)     Status: None (Preliminary result)   Collection Time: 08/01/21  1:12 PM   Specimen: Abscess  Result Value Ref Range Status   Specimen Description   Final    ABSCESS Performed at [facility], [address], [place], [place] [address]    Special Requests   Final    LEFT PERI RENAL ABSCESS Performed at [facility], [address] [place], [place] [id number]    Gram Stain   Final    ABUNDANT WBC PRESENT,BOTH PMN AND MONONUCLEAR NO ORGANISMS SEEN Performed at [facility], [place] [address], [place], [place] [address]    Culture PENDING  Incomplete   Report Status PENDING  Incomplete    Coagulation Studies: No results for input(s): LABPROT, INR in the last 72 hours.    Urinalysis: No results for input(s): COLORURINE, LABSPEC, PHURINE, GLUCOSEU, HGBUR, BILIRUBINUR, KETONESUR, PROTEINUR, UROBILINOGEN, NITRITE, LEUKOCYTESUR in the last 72 hours.  Invalid input(s): APPERANCEUR      Imaging: IR GASTROSTOMY TUBE MOD SED  Result Date: 08/01/2021 INDICATION: Dysphagia EXAM: PERC PLACEMENT GASTROSTOMY MEDICATIONS: None ANESTHESIA/SEDATION: Versed 0 mg IV; Fentanyl 37.5 mcg IV Moderate Sedation Time:  30 minutes The patient was continuously monitored during the procedure by the interventional radiology nurse under my direct supervision. CONTRAST:  Secondary to  patient's reported iodinated contrast allergy, gadolinium contrast was used for the procedure-administered into the gastric lumen. FLUOROSCOPY TIME:  Fluoroscopy Time: 2 minutes 54 seconds (33.5 mGy). COMPLICATIONS: None immediate. PROCEDURE: Informed written consent was obtained from the patient and/or patient's representative after a thorough discussion of the procedural risks, benefits and alternatives. All questions were addressed. Maximal Sterile barrier Technique was utilized including caps, mask, sterile gowns, sterile gloves, sterile drape, hand hygiene and skin antiseptic. A timeout was performed prior to the initiation of the procedure. The patient was placed on the procedure table in the supine position. Pre-procedure abdominal film confirmed visualization of the transverse colon. The patient was prepped and draped in usual sterile fashion. The stomach was insufflated with air via the indwelling nasogastric tube. Under fluoroscopy, a puncture site was selected and local analgesia achieved with 1% lidocaine infiltrated subcutaneously. Under fluoroscopic guidance, a gastropexy needle was passed into the stomach and the T-bar suture was released. Entry into the stomach was confirmed with fluoroscopy, aspiration of air, and injection of contrast material. This was repeated with an additional gastropexy suture (for a total of 2 fasteners). At the center of these gastropexy sutures, a dermatotomy was performed. An 18 gauge needle was passed into the stomach at the site of this dermatotomy, and position within the gastric lumen again confirmed under fluoroscopy using aspiration of air and contrast injection. An Amplatz guidewire was passed through this needle and intraluminal placement within the stomach was confirmed by fluoroscopy. The needle was removed. Over the guidewire, a dilator peel-away sheath was introduced and the 18 French gastrostomy tube was placed over wire. The retention balloon of the  percutaneous gastrostomy tube was inflated with 10 mL of sterile water. The tube was withdrawn until the retention balloon was at the edge of the gastric lumen. The external bumper was brought to the abdominal wall. Contrast was injected through the gastrostomy tube, confirming intraluminal positioning.  The patient tolerated the procedure well without any immediate post-procedural complications. IMPRESSION: Technically successful placement of 18 Fr gastrostomy tube, as above. RECOMMENDATIONS: The patient is to return to interventional radiology for routine gastrostomy tube replacement in 6 months. Michaelle Birks, MD [facility] Electronically Signed   By: Michaelle Birks M.D.   On: 08/01/2021 14:57   DG Chest Port 1 View  Result Date: 08/01/2021 CLINICAL DATA:  Postprocedure.  Evaluate for pneumothorax. EXAM: PORTABLE CHEST 1 VIEW COMPARISON:  Portable chest radiograph, 07/27/2021. FINDINGS: Support lines: LEFT IJ tunneled dialysis catheter, with tip within the RIGHT atrium. NG tube, with tip excluded from view. Multiple overlying pacer leads. Cardiomediastinal silhouette is unchanged. Hypoinflation with streaky perihilar opacities. LEFT basilar consolidation silhouetting the cardiac apex. No large pleural effusion or pneumothorax. No interval osseous abnormality IMPRESSION: 1. No postprocedure pneumothorax. 2. Lines and tubes as above. 3. Hypoinflation with perihilar and basilar opacities favored to represent interstitial edema with atelectasis, though pneumonia could appear similar. Electronically Signed   By: Michaelle Birks M.D.   On: 08/01/2021 15:27   CT IMAGE GUIDED DRAINAGE BY PERCUTANEOUS CATHETER  Result Date: 08/01/2021 INDICATION: Perirenal abscess. EXAM: CT GUIDED DRAINAGE CATHETER PLACEMENT INTO LEFT PERIANAL ABSCESS COMPARISON:  CT Abdomen Pelvis, 07/29/2021. MEDICATIONS: The patient is currently admitted to the hospital and receiving intravenous  antibiotics. The antibiotics were administered within an appropriate time frame prior to the initiation of the procedure. ANESTHESIA/SEDATION: Moderate (conscious) sedation was employed during this procedure. A total of Versed 0 mg and Fentanyl 12.5 mcg was administered intravenously. Moderate Sedation Time: 21 minutes. The patient's level of consciousness and vital signs were monitored continuously by radiology nursing throughout the procedure under my direct supervision. CONTRAST:  None COMPLICATIONS: None immediate. PROCEDURE: Informed written consent was obtained from the patient after a discussion of the risks, benefits and alternatives to treatment. The patient was placed supine on the CT gantry and a pre procedural CT was performed re-demonstrating the known abscess/fluid collection within the LEFT perirenal space. The procedure was planned. A timeout was performed prior to the initiation of the procedure. The LEFT flank was prepped and draped in the usual sterile fashion. The overlying soft tissues were anesthetized with 1% lidocaine with epinephrine. Appropriate trajectory was planned with the use of a 22 gauge spinal needle. An 18 gauge trocar needle was advanced into the abscess/fluid collection and a short Amplatz super stiff wire was coiled within the collection. Appropriate positioning was confirmed with a limited CT scan. The tract was serially dilated allowing placement of a 12 [place] all-purpose drainage catheter. Appropriate positioning was confirmed with a limited postprocedural CT scan. 15 mL of purulent fluid was aspirated. The tube was connected to a drainage bag and sutured in place. A dressing was placed. The patient tolerated the procedure well without immediate post procedural complication. IMPRESSION: Successful CT guided placement of a 72 French all purpose drain catheter into the LEFT pararenal abscess with aspiration of 15 mL of purulent fluid. Samples were sent to the laboratory as  requested by the ordering clinical team. Michaelle Birks, MD [facility] Radiology Electronically Signed   By: Michaelle Birks M.D.   On: 08/01/2021 14:49     Medications:    sodium chloride Stopped (07/30/21 2024)   amphotericin B  Conventional (FUNGIZONE) IV 125 mL/hr at 08/01/21 2358   dextrose 30 mL/hr at 08/02/21 0800   feeding supplement (GLUCERNA 1.5 CAL) Stopped (08/01/21 0000)  sodium chloride   Intravenous Once   sodium chloride   Intravenous Once   acetaminophen (TYLENOL) oral liquid 160 mg/5 mL  650 mg Per Tube Q24H   amLODipine  10 mg Per Tube Daily   chlorhexidine  15 mL Mouth Rinse BID   Chlorhexidine Gluconate Cloth  6 each Topical Q0600   cloNIDine  0.1 mg Per Tube Daily   collagenase   Topical Daily   dextrose  10 mL Intravenous Q24H   dextrose  10 mL Intravenous Q24H   epoetin (EPOGEN/PROCRIT) injection  20,000 Units Subcutaneous Weekly   free water  125 mL Per Tube Q4H   furosemide  80 mg Intravenous Q12H   haloperidol lactate  1 mg Intravenous Once   heparin injection (subcutaneous)  5,000 Units Subcutaneous Q8H   insulin aspart  0-20 Units Subcutaneous Q4H   insulin detemir  30 Units Subcutaneous BID   mouth rinse  15 mL Mouth Rinse q12n4p   metoprolol tartrate  100 mg Per Tube BID   multivitamin  1 tablet Per Tube QHS   nutrition supplement (JUVEN)  1 packet Per Tube BID BM   pantoprazole (PROTONIX) IV  40 mg Intravenous Q12H   potassium chloride  40 mEq Per Tube Daily   sodium chloride flush  10 mL Intracatheter Q12H     Assessment/ Plan:  Heather Lopez is a 75 y.o. white female with diabetes mellitus type II, hypertension, hyperlipidemia, nephrolithiasis, depression, gout who is admitted to [facility] on 06/27/2021 for Hyperkalemia [E87.5] Hyponatremia [E87.1] ARF (acute renal failure) (HCC) [N17.9] Acute renal failure (ARF) (HCC) [N17.9] Acute respiratory failure with hypoxia ([place]) [J96.01] Demand ischemia of  myocardium (HCC) [I24.8] Acute respiratory failure with hypoxemia (HCC) [J96.01] Acute renal failure superimposed on chronic kidney disease, unspecified CKD stage, unspecified acute renal failure type ([place]) [N17.9, N18.9] Sepsis, due to unspecified organism, unspecified whether acute organ dysfunction present [facility]) [A41.9]  Acute Kidney Injury on chronic kidney disease stage IV with baseline creatinine 1.97 and GFR of 25 on 12/22/19.  Acute kidney injury secondary to sepsis and obstructive uropathy. Chronic kidney disease is likely secondary to diabetic nephropathy.Nonoliguric urine output.    -Creatinine slightly lower today at 2.05. BUN decreased also to 95. No need for dialysis today. Will continue to monitor daily  Lab Results  Component Value Date   CREATININE 2.05 (H) 08/02/2021   CREATININE 2.01 (H) 07/31/2021   CREATININE 2.26 (H) 07/29/2021    Intake/Output Summary (Last 24 hours) at 08/02/2021 0904 Last data filed at 08/02/2021 0800 Gross per 24 hour  Intake 868.81 ml  Output 2755 ml  Net -1886.19 ml    2. Acute Respiratory failure secondary to community acquired pneumonia failing outpatient antibiotics. Febrile.  -High flow nasal cannula in palce  Weaning per pulmonary/critical care.  3. Anemia of chronic kidney disease Normocytic Lab Results  Component Value Date   HGB 8.7 (L) 08/02/2021   -Epogen 20,000 units subcutaneous weekly.  4.  Diabetes mellitus type II with chronic kidney disease insulin dependent. Most recent hemoglobin A1c is 8.3% on 06/12/21.  Continue to hold Metformin   5.Bilateral Nephrolithiasis- with obstructive uropathy and left renal abscess Abscess drained on August 26 Repeat renal ultrasound 9/2-no change in complex fluid collection noted adjacent to the left kidney. CT scan abdomen pelvis without contrast 07/29/2021: Persistent fluid collection in the anterior pararenal space.  This latest fluid collection does not communicate with the prior and  will likely require drainage. Successful CT guided exchange  of drainage catheter for Lt peri-renal abscess  6. Hypokalemia: -Serum potassium 4.1, will continue to monitor       LOS: Chloride 9/7/20229:04 AM

## 2021-08-03 LAB — TYPE AND SCREEN
ABO/RH(D): O POS
Antibody Screen: NEGATIVE

## 2021-08-03 LAB — BASIC METABOLIC PANEL
Anion gap: 14 (ref 5–15)
BUN: 104 mg/dL — ABNORMAL HIGH (ref 8–23)
CO2: 28 mmol/L (ref 22–32)
Calcium: 9.4 mg/dL (ref 8.9–10.3)
Chloride: 102 mmol/L (ref 98–111)
Creatinine, Ser: 2.34 mg/dL — ABNORMAL HIGH (ref 0.44–1.00)
GFR, Estimated: 21 mL/min — ABNORMAL LOW (ref >60)
Glucose, Bld: 220 mg/dL — ABNORMAL HIGH (ref 70–99)
Potassium: 3.8 mmol/L (ref 3.5–5.1)
Sodium: 144 mmol/L (ref 135–145)

## 2021-08-03 LAB — CBC
HCT: 27.4 % — ABNORMAL LOW (ref 36.0–46.0)
Hemoglobin: 8.6 g/dL — ABNORMAL LOW (ref 12.0–15.0)
MCH: 29.1 pg (ref 26.0–34.0)
MCHC: 31.4 g/dL (ref 30.0–36.0)
MCV: 92.6 fL (ref 80.0–100.0)
Platelets: 383 10*3/uL (ref 150–400)
RBC: 2.96 MIL/uL — ABNORMAL LOW (ref 3.87–5.11)
RDW: 16.4 % — ABNORMAL HIGH (ref 11.5–15.5)
WBC: 12.2 10*3/uL — ABNORMAL HIGH (ref 4.0–10.5)
nRBC: 0.2 % (ref 0.0–0.2)

## 2021-08-03 LAB — GLUCOSE, CAPILLARY
Glucose-Capillary: 198 mg/dL — ABNORMAL HIGH (ref 70–99)
Glucose-Capillary: 127 mg/dL — ABNORMAL HIGH (ref 70–99)
Glucose-Capillary: 149 mg/dL — ABNORMAL HIGH (ref 70–99)

## 2021-08-03 LAB — HEPATITIS B SURFACE ANTIBODY, QUANTITATIVE: Hep B S AB Quant (Post): 13.7 m[IU]/mL (ref >9.9)

## 2021-08-03 MED ORDER — AMLODIPINE BESYLATE 10 MG PO TABS: 10.0000 mg | ORAL_TABLET | Freq: Every day | ORAL | Status: DC

## 2021-08-03 MED ORDER — GLUCERNA 1.5 CAL PO LIQD: 1000.0000 mL | ORAL | Status: DC

## 2021-08-03 MED ORDER — TORSEMIDE 40 MG PO TABS: 40.0000 mg | ORAL_TABLET | Freq: Every day | ORAL | Status: DC

## 2021-08-03 MED ORDER — HEPARIN SODIUM (PORCINE) 5000 UNIT/ML IJ SOLN: 5000.0000 [IU] | Freq: Three times a day (TID) | INTRAMUSCULAR | Status: DC

## 2021-08-03 MED ORDER — FREE WATER: 125.0000 mL | Status: DC

## 2021-08-03 MED ORDER — INSULIN ASPART 100 UNIT/ML IJ SOLN: 0.0000 [IU] | INTRAMUSCULAR | 0 refills | Status: DC

## 2021-08-03 MED ORDER — METOPROLOL TARTRATE 100 MG PO TABS
100.0000 mg | ORAL_TABLET | Freq: Two times a day (BID) | ORAL | Status: DC
Start: 2021-08-03 — End: 2021-12-12

## 2021-08-03 MED ORDER — CLONIDINE HCL 0.1 MG PO TABS: 0.1000 mg | ORAL_TABLET | Freq: Every day | ORAL | 0 refills | Status: DC

## 2021-08-03 MED ORDER — EPOETIN ALFA 40000 UNIT/ML IJ SOLN: 20000.0000 [IU] | INTRAMUSCULAR | Status: DC

## 2021-08-03 MED ORDER — INSULIN DETEMIR 100 UNIT/ML ~~LOC~~ SOLN: 30.0000 [IU] | Freq: Two times a day (BID) | SUBCUTANEOUS | 11 refills | Status: DC

## 2021-08-03 MED ORDER — PANTOPRAZOLE SODIUM 40 MG PO PACK: 40.0000 mg | PACK | Freq: Two times a day (BID) | ORAL | Status: DC

## 2021-08-03 MED ORDER — JUVEN PO PACK: 1.0000 | PACK | Freq: Two times a day (BID) | ORAL | 0 refills | Status: DC

## 2021-08-03 NOTE — TOC Progression Note (Signed)
Transition of Care [facility]) - Progression Note    Patient Details  Name: Heather Lopez MRN: [id number] Date of Birth: 10/26/1946  Transition of Care [facility]) CM/SW [place], RN Phone Number: 08/03/2021, 12:42 PM  Clinical Narrative: Patient to transport to [facility] via [facility] to room 315, nurse to call report to [phone] or [phone]. Attending to call admission Attending Dr, Katherine Roan [phone]. No other TOC needs.       Expected Discharge Plan: [place] Barriers to Discharge: Continued Medical Work up  Expected Discharge Plan and Services Expected Discharge Plan: [place] In-house Referral: Clinical Social Work   Post Acute Care Choice: [place] Living arrangements for the past 2 months: Single Family Home Expected Discharge Date: 08/03/21                                     Social Determinants of Health (SDOH) Interventions    Readmission Risk Interventions No flowsheet data found.

## 2021-08-03 NOTE — Progress Notes (Signed)
[facility]  ROUNDING NOTE   Subjective:   Heather Lopez is a 75 y.o. white female who was admitted for community acquired pneumonia on 06/27/2021     Hospital course complicated by duodenal perforation s/p repair on 06/30/2021 Left obstructive hydronephrosis and renal abscess, Patient underwent cystoscopy and b/l ureteral stent placement 8/10 8/26- CT guided aspiration of perirenal abscess- 5 ml purulent fluid aspirated   Lab Results  Component Value Date   CREATININE 2.34 (H) 08/03/2021   CREATININE 2.05 (H) 08/02/2021   CREATININE 2.01 (H) 07/31/2021   09/07 0701 - 09/08 0700 In: 2629.7 [I.V.:173.9; NG/GT:1419; IV Piggyback:1036.8] Out: 9179 [Urine:1650]   -Renal function worse today.  Creatinine up to 2.34. BUN also up a bit to 104.   Objective:  Vital signs in last 24 hours:  Temp:  [97.4 F (36.3 C)-99.1 F (37.3 C)] 98.8 F (37.1 C) (09/08 0725) Pulse Rate:  [85-98] 97 (09/08 0700) Resp:  [17-34] 24 (09/08 0700) BP: (108-162)/(53-96) 162/71 (09/08 0700) SpO2:  [88 %-100 %] 94 % (09/08 0725) Weight:  [141.5 kg] 141.5 kg (09/08 0420)  Weight change:  Filed Weights   07/30/21 0500 08/01/21 0500 08/03/21 0420  Weight: (!) 149.5 kg (!) 156.2 kg (!) 141.5 kg    Intake/Output: I/O last 3 completed shifts: In: 3468.5 [I.V.:472.5; Other:10; XT/AV:6979; IV Piggyback:1537.1] Out: 3230 [Urine:3200; Drains:30]   Intake/Output this shift:  Total I/O In: -  Out: 350 [Urine:350]  Physical Exam: General: No acute distress  Head: [place]/AT, hearing intact  Eyes: Anicteric  Lungs:  +coarse breath sounds, normal effort  Heart: S1S2 on rubs  Abdomen:  Soft, NT, PEG tube in place  Extremities: + peripheral edema   Neurologic: Awake, alert, follows commands  GU Foley with yellow urine  Access:  LIJ permcath 8/18  Rectal tube  Basic Metabolic Panel: Recent Labs  Lab 07/28/21 0602 07/29/21 0615 07/30/21 0626 07/31/21 0459 08/01/21 0411 08/02/21 0214  08/03/21 0310  NA 144 146*  --  145  --  146* 144  K 3.5 3.3* 3.8 3.9 3.8 4.1 3.8  CL 107 108  --  108  --  106 102  CO2 28 25  --  26  --  28 28  GLUCOSE 120* 216*  --  136*  --  242* 220*  BUN 88* 87*  --  97*  --  95* 104*  CREATININE 2.26* 2.26*  --  2.01*  --  2.05* 2.34*  CALCIUM 9.1 9.3  --  9.6  --  9.7 9.4  MG 2.0 2.1 2.2 2.2 2.4 2.2  --   PHOS 4.5 3.9 4.3 3.6  3.7 4.3  --   --      Liver Function Tests: Recent Labs  Lab 07/28/21 0602 07/31/21 0459 08/02/21 0214  AST 19  --  25  ALT 31  --  33  ALKPHOS 98  --  115  BILITOT 0.7  --  0.7  PROT 6.7  --  6.9  ALBUMIN 2.0* 2.0* 2.4*    No results for input(s): LIPASE, AMYLASE in the last 168 hours. Recent Labs  Lab 07/28/21 0602  AMMONIA 11     CBC: Recent Labs  Lab 07/27/21 0950 07/28/21 0602 07/30/21 1227 08/02/21 0214 08/03/21 0310  WBC 13.3* 11.2* 9.6 13.3* 12.2*  NEUTROABS  --   --   --  10.5*  --   HGB 8.2* 7.8* 7.8* 8.7* 8.6*  HCT 25.2* 25.0* 24.1* 28.4* 27.4*  MCV  90.0 89.3 92.3 92.8 92.6  PLT 321 355 402* 452* 383     Cardiac Enzymes: No results for input(s): CKTOTAL, CKMB, CKMBINDEX, TROPONINI in the last 168 hours.  BNP: Invalid input(s): POCBNP  CBG: Recent Labs  Lab 08/02/21 1519 08/02/21 1913 08/02/21 2316 08/03/21 0330 08/03/21 0723  GLUCAP 189* 176* 230* 198* 127*     Microbiology: Results for orders placed or performed during the hospital encounter of 06/27/21  Resp Panel by RT-PCR (Flu A&B, Covid) Nasopharyngeal Swab     Status: None   Collection Time: 06/27/21 12:32 AM   Specimen: Nasopharyngeal Swab; Nasopharyngeal(NP) swabs in vial transport medium  Result Value Ref Range Status   SARS Coronavirus 2 by RT PCR NEGATIVE NEGATIVE Final    Comment: (NOTE) SARS-CoV-2 target nucleic acids are NOT DETECTED.  The SARS-CoV-2 RNA is generally detectable in upper respiratory specimens during the acute phase of infection. The lowest concentration of SARS-CoV-2 viral  copies this assay can detect is 138 copies/mL. A negative result does not preclude SARS-Cov-2 infection and should not be used as the sole basis for treatment or other patient management decisions. A negative result may occur with  improper specimen collection/handling, submission of specimen other than nasopharyngeal swab, presence of viral mutation(s) within the areas targeted by this assay, and inadequate number of viral copies(<138 copies/mL). A negative result must be combined with clinical observations, patient history, and epidemiological information. The expected result is Negative.  Fact Sheet for Patients:  EntrepreneurPulse.com.au  Fact Sheet for Healthcare Providers:  IncredibleEmployment.be  This test is no t yet approved or cleared by the [place] FDA and  has been authorized for detection and/or diagnosis of SARS-CoV-2 by FDA under an Emergency Use Authorization (EUA). This EUA will remain  in effect (meaning this test can be used) for the duration of the COVID-19 declaration under Section 564(b)(1) of the Act, 21 U.S.C.section 360bbb-3(b)(1), unless the authorization is terminated  or revoked sooner.       Influenza A by PCR NEGATIVE NEGATIVE Final   Influenza B by PCR NEGATIVE NEGATIVE Final    Comment: (NOTE) The Xpert Xpress SARS-CoV-2/FLU/RSV plus assay is intended as an aid in the diagnosis of influenza from Nasopharyngeal swab specimens and should not be used as a sole basis for treatment. Nasal washings and aspirates are unacceptable for Xpert Xpress SARS-CoV-2/FLU/RSV testing.  Fact Sheet for Patients: EntrepreneurPulse.com.au  Fact Sheet for Healthcare Providers: IncredibleEmployment.be  This test is not yet approved or cleared by the [place] FDA and has been authorized for detection and/or diagnosis of SARS-CoV-2 by FDA under an Emergency Use Authorization (EUA). This  EUA will remain in effect (meaning this test can be used) for the duration of the COVID-19 declaration under Section 564(b)(1) of the Act, 21 U.S.C. section 360bbb-3(b)(1), unless the authorization is terminated or revoked.  Performed at [facility], [address] [place], [place] [address]   Blood culture (routine x 2)     Status: None   Collection Time: 06/27/21 12:43 AM   Specimen: BLOOD  Result Value Ref Range Status   Specimen Description BLOOD RIGHT ASSIST CONTROL  Final   Special Requests   Final    BOTTLES DRAWN AEROBIC AND ANAEROBIC Blood Culture results may not be optimal due to an inadequate volume of blood received in culture bottles   Culture   Final    NO GROWTH 5 DAYS Performed at [facility], [address], [place], [place] [address]  Report Status 07/02/2021 FINAL  Final  Blood culture (routine x 2)     Status: None   Collection Time: 06/27/21  1:45 AM   Specimen: BLOOD  Result Value Ref Range Status   Specimen Description BLOOD RIGHT ASSIST CONTROL  Final   Special Requests   Final    BOTTLES DRAWN AEROBIC AND ANAEROBIC Blood Culture adequate volume   Culture   Final    NO GROWTH 5 DAYS Performed at [facility], [address] [place], [place] [address]    Report Status 07/02/2021 FINAL  Final  Urine Culture     Status: Abnormal   Collection Time: 06/27/21  2:39 AM   Specimen: In/Out Cath Urine  Result Value Ref Range Status   Specimen Description   Final    IN/OUT CATH URINE Performed at [facility], [address], [place], [place] [address]    Special Requests   Final    NONE Performed at [facility], [place], [place] [address]    Culture 30,000 COLONIES/mL YEAST (A)  Final   Report Status 06/28/2021 FINAL  Final  MRSA Next Gen by PCR, Nasal     Status: None   Collection Time: 06/30/21  5:01 AM   Specimen: Nasal Mucosa; Nasal Swab  Result Value Ref Range Status   MRSA  by PCR Next Gen NOT DETECTED NOT DETECTED Final    Comment: (NOTE) The GeneXpert MRSA Assay (FDA approved for NASAL specimens only), is one component of a comprehensive MRSA colonization surveillance program. It is not intended to diagnose MRSA infection nor to guide or monitor treatment for MRSA infections. Test performance is not FDA approved in patients less than 90 years old. Performed at [facility], [address] [place], [place] [address]   Urine Culture     Status: Abnormal   Collection Time: 07/05/21 10:36 AM   Specimen: PATH Other; Urine  Result Value Ref Range Status   Specimen Description   Final    URINE, RANDOM LEFT RENAL PELVIS URINE Performed at [facility], [address] [place], [place] [address]    Special Requests   Final    NONE Performed at [facility], [address] [place], [place] [address]    Culture 70,000 [place] (A)  Final   Report Status 07/12/2021 FINAL  Final  Urine Culture     Status: Abnormal   Collection Time: 07/05/21 10:48 AM   Specimen: PATH Other; Urine  Result Value Ref Range Status   Specimen Description   Final    URINE, RANDOM RIGHT KIDNEY URINE Performed at [facility], [address], [place], [place] [address]    Special Requests   Final    NONE Performed at [facility], [address], [place], [place] [address]    Culture (A)  Final    >=100,000 COLONIES/mL CANDIDA GLABRATA SEE SEPARATE REPORT Performed at [facility], [place] [address], [place], [place] [address]    Report Status 07/17/2021 FINAL  Final  Antifungal AST 9 Drug Panel     Status: None   Collection Time: 07/05/21 10:48 AM  Result Value Ref Range Status   Organism ID, Yeast Candida glabrata  Corrected    Comment: (NOTE) Identification performed by account, not confirmed by this laboratory. CORRECTED ON 08/21 AT 1535: PREVIOUSLY REPORTED AS Preliminary report     Amphotericin B MIC 0.5 ug/mL  Final    Comment: (  NOTE) Breakpoints have been established for only some organism-drug combinations as indicated. This test was developed and its performance characteristics determined by [facility]. It has not been cleared or approved by the Food and Drug Administration.    Anidulafungin MIC Comment  Final    Comment: (NOTE) 0.03 ug/mL Susceptible Breakpoints have been established for only some organism-drug combinations as indicated. This test was developed and its performance characteristics determined by [facility]. It has not been cleared or approved by the Food and Drug Administration.    Caspofungin MIC Comment  Final    Comment: (NOTE) 0.06 ug/mL Susceptible Breakpoints have been established for only some organism-drug combinations as indicated. This test was developed and its performance characteristics determined by [facility]. It has not been cleared or approved by the Food and Drug Administration.    Micafungin MIC Comment  Final    Comment: (NOTE) 0.016 ug/mL Susceptible Breakpoints have been established for only some organism-drug combinations as indicated. This test was developed and its performance characteristics determined by [facility]. It has not been cleared or approved by the Food and Drug Administration.    Posaconazole MIC 0.25 ug/mL  Final    Comment: (NOTE) Breakpoints have been established for only some organism-drug combinations as indicated. This test was developed and its performance characteristics determined by [facility]. It has not been cleared or approved by the Food and Drug Administration.    Fluconazole Islt MIC 2.0 ug/mL  Final    Comment: (NOTE) Susceptible Dose Dependent Breakpoints have been established for only some organism-drug combinations as indicated. This test was developed and its performance characteristics determined by [facility]. It has not been cleared or approved by the Food and Drug  Administration.    Flucytosine MIC 0.06 ug/mL or less  Final    Comment: (NOTE) Breakpoints have been established for only some organism-drug combinations as indicated. This test was developed and its performance characteristics determined by [facility]. It has not been cleared or approved by the Food and Drug Administration.    Itraconazole MIC 0.12 ug/mL  Final    Comment: (NOTE) Breakpoints have been established for only some organism-drug combinations as indicated. This test was developed and its performance characteristics determined by [facility]. It has not been cleared or approved by the Food and Drug Administration.    Voriconazole MIC 0.06 ug/mL  Final    Comment: (NOTE) Breakpoints have been established for only some organism-drug combinations as indicated. This test was developed and its performance characteristics determined by [facility]. It has not been cleared or approved by the Food and Drug Administration. Performed At: [facility] [address] [place], [place] [id number] Rush Farmer MD UD:[id number]    Source CANDIDA GLABRATA SUSCEPTIBILITY URINE CULTURE  Final    Comment: Performed at [facility], [place] [address], [place], [place] [address]  CULTURE, BLOOD (ROUTINE X 2) w Reflex to ID Panel     Status: None   Collection Time: 07/15/21  7:57 PM   Specimen: BLOOD  Result Value Ref Range Status   Specimen Description BLOOD LEFT ASSIST CONTROL  Final   Special Requests   Final    BOTTLES DRAWN AEROBIC AND ANAEROBIC Blood Culture adequate volume   Culture   Final    NO GROWTH 5 DAYS Performed at [facility], [address] [place], [place] [address]    Report Status 07/20/2021 FINAL  Final  CULTURE, BLOOD (ROUTINE X 2) w Reflex to ID Panel     Status: None  Collection Time: 07/15/21  8:07 PM   Specimen: BLOOD  Result Value Ref Range Status   Specimen Description BLOOD LEFT FOREARM  Final   Special Requests   Final    BOTTLES  DRAWN AEROBIC AND ANAEROBIC Blood Culture adequate volume   Culture   Final    NO GROWTH 5 DAYS Performed at [facility], [address], [place], [place] [address]    Report Status 07/20/2021 FINAL  Final  Body fluid culture w Gram Stain     Status: None   Collection Time: 07/21/21  1:23 PM   Specimen: Peritoneal Washings; Body Fluid  Result Value Ref Range Status   Specimen Description   Final    PERITONEAL Performed at [facility], [address] [place], [place] [address]    Special Requests   Final    NONE Performed at [facility], [place], [place] [address]    Gram Stain   Final    MODERATE WBC PRESENT,BOTH PMN AND MONONUCLEAR NO ORGANISMS SEEN    Culture   Final    RARE CANDIDA GLABRATA Performed at [facility] SEE RESULTS IN SEPERATE REPORT Performed at [facility], [place] [address], [place], [place] [address]    Report Status 07/31/2021 FINAL  Final  Antifungal AST 9 Drug Panel     Status: None   Collection Time: 07/21/21  1:23 PM  Result Value Ref Range Status   Organism ID, Yeast Candida glabrata  Corrected    Comment: (NOTE) Identification performed by account, not confirmed by this laboratory. CORRECTED ON 09/04 AT 1435: PREVIOUSLY REPORTED AS Preliminary report    Amphotericin B MIC 0.5 ug/mL  Final    Comment: (NOTE) Breakpoints have been established for only some organism-drug combinations as indicated. This test was developed and its performance characteristics determined by [facility]. It has not been cleared or approved by the Food and Drug Administration.    Anidulafungin MIC Comment  Final    Comment: (NOTE) 0.03 ug/mL Susceptible Breakpoints have been established for only some organism-drug combinations as indicated. This test was developed and its performance characteristics determined by [facility]. It has not been cleared or approved by the Food and Drug Administration.     Caspofungin MIC Comment  Final    Comment: (NOTE) 0.06 ug/mL Susceptible Breakpoints have been established for only some organism-drug combinations as indicated. This test was developed and its performance characteristics determined by [facility]. It has not been cleared or approved by the Food and Drug Administration.    Micafungin MIC Comment  Final    Comment: (NOTE) 0.016 ug/mL Susceptible Breakpoints have been established for only some organism-drug combinations as indicated. This test was developed and its performance characteristics determined by [facility]. It has not been cleared or approved by the Food and Drug Administration.    Posaconazole MIC 0.25 ug/mL  Final    Comment: (NOTE) Breakpoints have been established for only some organism-drug combinations as indicated. This test was developed and its performance characteristics determined by [facility]. It has not been cleared or approved by the Food and Drug Administration.    Fluconazole Islt MIC 4.0 ug/mL  Final    Comment: (NOTE) Susceptible Dose Dependent Breakpoints have been established for only some organism-drug combinations as indicated. This test was developed and its performance characteristics determined by [facility]. It has not been cleared or approved by the Food and Drug Administration.    Flucytosine MIC 0.06 ug/mL or less  Final  Comment: (NOTE) Breakpoints have been established for only some organism-drug combinations as indicated. This test was developed and its performance characteristics determined by [facility]. It has not been cleared or approved by the Food and Drug Administration.    Itraconazole MIC 0.25 ug/mL  Final    Comment: (NOTE) Breakpoints have been established for only some organism-drug combinations as indicated. This test was developed and its performance characteristics determined by [facility]. It has not been cleared or approved by the Food and Drug Administration.     Voriconazole MIC 0.06 ug/mL  Final    Comment: (NOTE) Breakpoints have been established for only some organism-drug combinations as indicated. This test was developed and its performance characteristics determined by [facility]. It has not been cleared or approved by the Food and Drug Administration. Performed At: [facility] [place], [place] [id number] Rush Farmer MD WN:[id number]    Source CANDIDA GLABRATA/ PERITONEAL FLUID  Final    Comment: Performed at [facility], [place] [address], [place], [place] [address]  Aerobic/Anaerobic Culture w Gram Stain (surgical/deep wound)     Status: None   Collection Time: 07/21/21  3:32 PM   Specimen: Abdomen  Result Value Ref Range Status   Specimen Description   Final    ABDOMEN Performed at [facility], [address] [place], [place] [address]    Special Requests   Final    NONE Performed at [facility], [address] [place], [place] [address]    Gram Stain   Final    MODERATE SQUAMOUS EPITHELIAL CELLS PRESENT WBC PRESENT, PREDOMINANTLY PMN NO ORGANISMS SEEN    Culture   Final    RARE CANDIDA GLABRATA NO ANAEROBES ISOLATED Performed at [facility], [place] [address] [place], [place] [address]    Report Status 07/26/2021 FINAL  Final  CULTURE, BLOOD (ROUTINE X 2) w Reflex to ID Panel     Status: Abnormal   Collection Time: 07/27/21  4:55 PM   Specimen: BLOOD  Result Value Ref Range Status   Specimen Description   Final    BLOOD BLOOD LEFT HAND Performed at [facility], [address], [place], [place] [address]    Special Requests   Final    BOTTLES DRAWN AEROBIC AND ANAEROBIC Blood Culture results may not be optimal due to an inadequate volume of blood received in culture bottles Performed at [facility], [address], [place], [place] [address]    Culture  Setup Time   Final    Organism ID to follow [place] CRITICAL RESULT CALLED TO, READ BACK BY AND VERIFIED WITH: [place] Performed at [facility], [address] [place], [place] [address]    Culture (A)  Final    STAPHYLOCOCCUS EPIDERMIDIS THE SIGNIFICANCE OF ISOLATING THIS ORGANISM FROM A SINGLE SET OF BLOOD CULTURES WHEN MULTIPLE SETS ARE DRAWN IS UNCERTAIN. PLEASE NOTIFY THE MICROBIOLOGY DEPARTMENT WITHIN ONE WEEK IF SPECIATION AND SENSITIVITIES ARE REQUIRED. Performed at [facility], [place] [address], [place], [place] [address]    Report Status 07/31/2021 FINAL  Final  Blood Culture ID Panel (Reflexed)     Status: Abnormal   Collection Time: 07/27/21  4:55 PM  Result Value Ref Range Status   Enterococcus faecalis NOT DETECTED NOT DETECTED Final   Enterococcus Faecium NOT DETECTED NOT DETECTED Final   Listeria monocytogenes NOT DETECTED NOT DETECTED Final  Staphylococcus species DETECTED (A) NOT DETECTED Final    Comment: CRITICAL RESULT CALLED TO, READ BACK BY AND VERIFIED WITH: SUZANE WATSON AT 1940 ON 07/28/21 BY SS    Staphylococcus aureus (BCID) NOT DETECTED NOT DETECTED Final   Staphylococcus epidermidis DETECTED (A) NOT DETECTED Final    Comment: Methicillin (oxacillin) resistant coagulase negative staphylococcus. Possible blood culture contaminant (unless isolated from more than one blood culture draw or clinical case suggests pathogenicity). No antibiotic treatment is indicated for blood  culture contaminants. CRITICAL RESULT CALLED TO, READ BACK BY AND VERIFIED WITH: [place] ON 07/28/21 BY SS    Staphylococcus lugdunensis NOT DETECTED NOT DETECTED Final   Streptococcus species NOT DETECTED NOT DETECTED Final   Streptococcus agalactiae NOT DETECTED NOT DETECTED Final   Streptococcus pneumoniae NOT DETECTED NOT DETECTED Final   Streptococcus pyogenes NOT DETECTED NOT DETECTED Final   A.calcoaceticus-baumannii NOT DETECTED NOT DETECTED Final   Bacteroides fragilis NOT  DETECTED NOT DETECTED Final   Enterobacterales NOT DETECTED NOT DETECTED Final   Enterobacter cloacae complex NOT DETECTED NOT DETECTED Final   Escherichia coli NOT DETECTED NOT DETECTED Final   Klebsiella aerogenes NOT DETECTED NOT DETECTED Final   Klebsiella oxytoca NOT DETECTED NOT DETECTED Final   Klebsiella pneumoniae NOT DETECTED NOT DETECTED Final   Proteus species NOT DETECTED NOT DETECTED Final   Salmonella species NOT DETECTED NOT DETECTED Final   Serratia marcescens NOT DETECTED NOT DETECTED Final   Haemophilus influenzae NOT DETECTED NOT DETECTED Final   Neisseria meningitidis NOT DETECTED NOT DETECTED Final   Pseudomonas aeruginosa NOT DETECTED NOT DETECTED Final   Stenotrophomonas maltophilia NOT DETECTED NOT DETECTED Final   Candida albicans NOT DETECTED NOT DETECTED Final   Candida auris NOT DETECTED NOT DETECTED Final   Candida glabrata NOT DETECTED NOT DETECTED Final   Candida krusei NOT DETECTED NOT DETECTED Final   Candida parapsilosis NOT DETECTED NOT DETECTED Final   Candida tropicalis NOT DETECTED NOT DETECTED Final   Cryptococcus neoformans/gattii NOT DETECTED NOT DETECTED Final   Methicillin resistance mecA/C DETECTED (A) NOT DETECTED Final    Comment: CRITICAL RESULT CALLED TO, READ BACK BY AND VERIFIED WITH: [place] ON 07/28/21 BY SS Performed at [facility], [address] [place], [place], [place] [address]   CULTURE, BLOOD (ROUTINE X 2) w Reflex to ID Panel     Status: None   Collection Time: 07/27/21  4:56 PM   Specimen: BLOOD  Result Value Ref Range Status   Specimen Description BLOOD BLOOD RIGHT HAND  Final   Special Requests   Final    BOTTLES DRAWN AEROBIC AND ANAEROBIC Blood Culture adequate volume   Culture   Final    NO GROWTH 5 DAYS Performed at [facility], [address] [place], [place] [address]    Report Status 08/01/2021 FINAL  Final  CULTURE, BLOOD (ROUTINE X 2) w Reflex to ID Panel     Status: None  (Preliminary result)   Collection Time: 07/28/21  8:59 PM   Specimen: BLOOD  Result Value Ref Range Status   Specimen Description BLOOD BLOOD RIGHT HAND  Final   Special Requests   Final    BOTTLES DRAWN AEROBIC AND ANAEROBIC Blood Culture results may not be optimal due to an inadequate volume of blood received in culture bottles   Culture  Setup Time   Final    TEST CANCELLED PER MD Dr. Delaine Lame Performed at [facility], [place],  [place], [place] [address]    Report Status PENDING  Incomplete  Aerobic/Anaerobic Culture w Gram Stain (surgical/deep wound)     Status: None (Preliminary result)   Collection Time: 08/01/21  1:12 PM   Specimen: Abscess  Result Value Ref Range Status   Specimen Description   Final    ABSCESS Performed at [facility], [address] [place], [place] [address]    Special Requests   Final    LEFT PERI RENAL ABSCESS Performed at [facility], [address] [place], [place] [address]    Gram Stain   Final    ABUNDANT WBC PRESENT,BOTH PMN AND MONONUCLEAR NO ORGANISMS SEEN    Culture   Final    NO GROWTH < 24 HOURS Performed at [facility], [place] [address] [place], [place] [address]    Report Status PENDING  Incomplete    Coagulation Studies: No results for input(s): LABPROT, INR in the last 72 hours.    Urinalysis: No results for input(s): COLORURINE, LABSPEC, PHURINE, GLUCOSEU, HGBUR, BILIRUBINUR, KETONESUR, PROTEINUR, UROBILINOGEN, NITRITE, LEUKOCYTESUR in the last 72 hours.  Invalid input(s): APPERANCEUR      Imaging: IR GASTROSTOMY TUBE MOD SED  Result Date: 08/01/2021 INDICATION: Dysphagia EXAM: PERC PLACEMENT GASTROSTOMY MEDICATIONS: None ANESTHESIA/SEDATION: Versed 0 mg IV; Fentanyl 37.5 mcg IV Moderate Sedation Time:  30 minutes The patient was continuously monitored during the procedure by the interventional radiology nurse under my direct supervision. CONTRAST:  Secondary to patient's  reported iodinated contrast allergy, gadolinium contrast was used for the procedure-administered into the gastric lumen. FLUOROSCOPY TIME:  Fluoroscopy Time: 2 minutes 54 seconds (33.5 mGy). COMPLICATIONS: None immediate. PROCEDURE: Informed written consent was obtained from the patient and/or patient's representative after a thorough discussion of the procedural risks, benefits and alternatives. All questions were addressed. Maximal Sterile barrier Technique was utilized including caps, mask, sterile gowns, sterile gloves, sterile drape, hand hygiene and skin antiseptic. A timeout was performed prior to the initiation of the procedure. The patient was placed on the procedure table in the supine position. Pre-procedure abdominal film confirmed visualization of the transverse colon. The patient was prepped and draped in usual sterile fashion. The stomach was insufflated with air via the indwelling nasogastric tube. Under fluoroscopy, a puncture site was selected and local analgesia achieved with 1% lidocaine infiltrated subcutaneously. Under fluoroscopic guidance, a gastropexy needle was passed into the stomach and the T-bar suture was released. Entry into the stomach was confirmed with fluoroscopy, aspiration of air, and injection of contrast material. This was repeated with an additional gastropexy suture (for a total of 2 fasteners). At the center of these gastropexy sutures, a dermatotomy was performed. An 18 gauge needle was passed into the stomach at the site of this dermatotomy, and position within the gastric lumen again confirmed under fluoroscopy using aspiration of air and contrast injection. An Amplatz guidewire was passed through this needle and intraluminal placement within the stomach was confirmed by fluoroscopy. The needle was removed. Over the guidewire, a dilator peel-away sheath was introduced and the 18 French gastrostomy tube was placed over wire. The retention balloon of the percutaneous  gastrostomy tube was inflated with 10 mL of sterile water. The tube was withdrawn until the retention balloon was at the edge of the gastric lumen. The external bumper was brought to the abdominal wall. Contrast was injected through the gastrostomy tube, confirming intraluminal positioning. The patient tolerated the procedure well without any immediate post-procedural complications. IMPRESSION: Technically successful placement of  18 Fr gastrostomy tube, as above. RECOMMENDATIONS: The patient is to return to interventional radiology for routine gastrostomy tube replacement in 6 months. Michaelle Birks, MD [facility] Electronically Signed   By: Michaelle Birks M.D.   On: 08/01/2021 14:57   DG Chest Port 1 View  Result Date: 08/01/2021 CLINICAL DATA:  Postprocedure.  Evaluate for pneumothorax. EXAM: PORTABLE CHEST 1 VIEW COMPARISON:  Portable chest radiograph, 07/27/2021. FINDINGS: Support lines: LEFT IJ tunneled dialysis catheter, with tip within the RIGHT atrium. NG tube, with tip excluded from view. Multiple overlying pacer leads. Cardiomediastinal silhouette is unchanged. Hypoinflation with streaky perihilar opacities. LEFT basilar consolidation silhouetting the cardiac apex. No large pleural effusion or pneumothorax. No interval osseous abnormality IMPRESSION: 1. No postprocedure pneumothorax. 2. Lines and tubes as above. 3. Hypoinflation with perihilar and basilar opacities favored to represent interstitial edema with atelectasis, though pneumonia could appear similar. Electronically Signed   By: Michaelle Birks M.D.   On: 08/01/2021 15:27   CT IMAGE GUIDED DRAINAGE BY PERCUTANEOUS CATHETER  Result Date: 08/01/2021 INDICATION: Perirenal abscess. EXAM: CT GUIDED DRAINAGE CATHETER PLACEMENT INTO LEFT PERIANAL ABSCESS COMPARISON:  CT Abdomen Pelvis, 07/29/2021. MEDICATIONS: The patient is currently admitted to the hospital and receiving intravenous antibiotics.  The antibiotics were administered within an appropriate time frame prior to the initiation of the procedure. ANESTHESIA/SEDATION: Moderate (conscious) sedation was employed during this procedure. A total of Versed 0 mg and Fentanyl 12.5 mcg was administered intravenously. Moderate Sedation Time: 21 minutes. The patient's level of consciousness and vital signs were monitored continuously by radiology nursing throughout the procedure under my direct supervision. CONTRAST:  None COMPLICATIONS: None immediate. PROCEDURE: Informed written consent was obtained from the patient after a discussion of the risks, benefits and alternatives to treatment. The patient was placed supine on the CT gantry and a pre procedural CT was performed re-demonstrating the known abscess/fluid collection within the LEFT perirenal space. The procedure was planned. A timeout was performed prior to the initiation of the procedure. The LEFT flank was prepped and draped in the usual sterile fashion. The overlying soft tissues were anesthetized with 1% lidocaine with epinephrine. Appropriate trajectory was planned with the use of a 22 gauge spinal needle. An 18 gauge trocar needle was advanced into the abscess/fluid collection and a short Amplatz super stiff wire was coiled within the collection. Appropriate positioning was confirmed with a limited CT scan. The tract was serially dilated allowing placement of a 12 [place] all-purpose drainage catheter. Appropriate positioning was confirmed with a limited postprocedural CT scan. 15 mL of purulent fluid was aspirated. The tube was connected to a drainage bag and sutured in place. A dressing was placed. The patient tolerated the procedure well without immediate post procedural complication. IMPRESSION: Successful CT guided placement of a 29 French all purpose drain catheter into the LEFT pararenal abscess with aspiration of 15 mL of purulent fluid. Samples were sent to the laboratory as requested by the  ordering clinical team. Michaelle Birks, MD [facility] Radiology Electronically Signed   By: Michaelle Birks M.D.   On: 08/01/2021 14:49     Medications:    sodium chloride Stopped (07/30/21 2024)   amphotericin B  Conventional (FUNGIZONE) IV Stopped (08/03/21 0146)   dextrose Stopped (08/02/21 1247)   feeding supplement (GLUCERNA 1.5 CAL) 1,000 mL (08/02/21 1351)    sodium chloride   Intravenous Once   sodium chloride   Intravenous Once   amLODipine  10 mg Per Tube Daily   chlorhexidine  15 mL Mouth Rinse BID   Chlorhexidine Gluconate Cloth  6 each Topical Q0600   cloNIDine  0.1 mg Per Tube Daily   collagenase   Topical Daily   epoetin (EPOGEN/PROCRIT) injection  20,000 Units Subcutaneous Weekly   free water  125 mL Per Tube Q4H   haloperidol lactate  1 mg Intravenous Once   heparin injection (subcutaneous)  5,000 Units Subcutaneous Q8H   insulin aspart  0-20 Units Subcutaneous Q4H   insulin detemir  30 Units Subcutaneous BID   mouth rinse  15 mL Mouth Rinse q12n4p   metoprolol tartrate  100 mg Per Tube BID   nutrition supplement (JUVEN)  1 packet Per Tube BID BM   pantoprazole sodium  40 mg Per Tube BID   potassium chloride  40 mEq Per Tube Daily   sodium chloride flush  10 mL Intracatheter Q12H   torsemide  40 mg Per Tube Daily     Assessment/ Plan:  Heather Lopez is a 75 y.o. white female with diabetes mellitus type II, hypertension, hyperlipidemia, nephrolithiasis, depression, gout who is admitted to [facility] on 06/27/2021 for Hyperkalemia [E87.5] Hyponatremia [E87.1] ARF (acute renal failure) (HCC) [N17.9] Acute renal failure (ARF) (HCC) [N17.9] Acute respiratory failure with hypoxia ([place]) [J96.01] Demand ischemia of myocardium (HCC) [I24.8] Acute respiratory failure with hypoxemia (HCC) [J96.01] Acute renal failure superimposed on chronic kidney disease, unspecified CKD stage, unspecified acute renal failure type ([place]) [N17.9,  N18.9] Sepsis, due to unspecified organism, unspecified whether acute organ dysfunction present [facility]) [A41.9]  Acute Kidney Injury on chronic kidney disease stage IV with baseline creatinine 1.97 and GFR of 25 on 12/22/19.  Acute kidney injury secondary to sepsis and obstructive uropathy. Chronic kidney disease is likely secondary to diabetic nephropathy.Nonoliguric urine output.    -Renal function a bit worse today.  BUN up to 104 with a creatinine of 2.34 and EGFR 21.  May need to consider another dialysis session tomorrow but hold off for now.  Lab Results  Component Value Date   CREATININE 2.34 (H) 08/03/2021   CREATININE 2.05 (H) 08/02/2021   CREATININE 2.01 (H) 07/31/2021    Intake/Output Summary (Last 24 hours) at 08/03/2021 0755 Last data filed at 08/03/2021 0725 Gross per 24 hour  Intake 2629.72 ml  Output 2000 ml  Net 629.72 ml    2. Acute Respiratory failure secondary to community acquired pneumonia failing outpatient antibiotics. Febrile.  -Patient weaned off of high flow nasal cannula and now on regular nasal cannula.  3. Anemia of chronic kidney disease Normocytic Lab Results  Component Value Date   HGB 8.6 (L) 08/03/2021   -Maintain the patient on Epogen 20,000 units subcutaneous weekly.  4.  Diabetes mellitus type II with chronic kidney disease insulin dependent. Most recent hemoglobin A1c is 8.3% on 06/12/21.  Continue to hold Metformin   5.Bilateral Nephrolithiasis- with obstructive uropathy and left renal abscess Abscess drained on August 26 Repeat renal ultrasound 9/2-no change in complex fluid collection noted adjacent to the left kidney. CT scan abdomen pelvis without contrast 07/29/2021: Persistent fluid collection in the anterior pararenal space.  This latest fluid collection does not communicate with the prior and will likely require drainage. Successful CT guided exchange of drainage catheter for Lt peri-renal abscess  6. Hypokalemia: -Potassium  currently 3.8 and within target range.       LOS: 37 Dionicio Shelnutt 9/8/20227:55 AM

## 2021-08-03 NOTE — Progress Notes (Signed)
Pt discharged to [facility] via [place]. Husband at bedside, report given to [facility].

## 2021-08-03 NOTE — Progress Notes (Signed)
Patient removing bipap mask even with redirection. Placed pt on 5L [place]. O2 sats 94%. RT notified.

## 2021-08-03 NOTE — Progress Notes (Signed)
During full bath and bed change, dark red blood observed from rectum. Heather Rowan, NP made aware and STAT labs ordered. Will continue to monitor.

## 2021-08-03 NOTE — Discharge Summary (Addendum)
Physician Discharge Summary  Patient ID: Heather Lopez MRN: [id number] DOB/AGE: 01-20-1946 75 y.o.  Admit date: 06/27/2021 Discharge date: 08/03/2021  Admission Diagnoses:  Discharge Diagnoses:  Active Problems:   Uncontrolled type 2 diabetes mellitus with insulin therapy ([place])   Hyperlipidemia associated with type 2 diabetes mellitus ([place])   Type 2 diabetes mellitus with stage 4 chronic kidney disease, with long-term current use of insulin (HCC)   Acute respiratory failure with hypoxia (HCC)   Severe sepsis with septic shock (HCC)   Acute lower UTI   Acute kidney injury superimposed on chronic kidney disease (HCC)   Pneumoperitoneum   Pressure injury of skin   Atrial flutter with rapid ventricular response ([place])   Endotracheally intubated   Bilateral nephrolithiasis   Anemia of chronic disease   Demand ischemia of myocardium (HCC)   Lethargy   Duodenal ulcer perforation ([place])   Renal abscess   Essential hypertension   Hypokalemia   Discharged Condition: fair  Hospital Course:  Heather Lopez is a 75 y.o. female with history of hypertension, diabetes mellitus, hyperlipidemia has been feeling weak for the last 3 to 4 days.  Patient had a acute renal failure with creatinine of 9.3, potassium 7.5, acute metabolic acidosis with pH 7.15, procalcitonin 6.83.  Patient was diagnosed was septic shock, acute respiratory failure. On night of 8/4, patient developed acute abdominal pain, CT scan showed acute abdomen secondary to perforated duodenum.  Patient was placed on mechanical ventilation after duodenum repair. Patient had a bilateral hydronephrosis, ureteral stents was placed on 8/10. Patient urine culture was positive for Candida glabrata.  ID consult was obtained, patient currently on high-dose of Diflucan. 8/26. Due to persistent fever, CT scan of abdomen/pelvis was repeated on 8/26, aspiration pararenal fluids was obtained, removed 5 mL of pus.  ID changed antibiotic to Amphotericin in  addition to Zosyn Patient had an left perirenal abscess drain placed on 9/6, PEG tube was also placed.  Bilateral hydronephrosis has resolved.  Patient is continued on Amphotericin per ID.   Acute metabolic encephalopathy. Sepsis with septic shock secondary to obstructive uropathy, kidney stone.  Status post bilateral ureteral stents. Candida urinary tract infection Perirenal abscess. Perforated duodenum. S/p repair. Patient condition had improved, no additional fever. Patient will complete the final dose of Amphotericin today to complete a 14-day course. Left pararenal drain still in place, need followed. Hydronephrosis has resolved after ureteral stents as well as Foley catheter is placed.  Foley catheter still in place.  Follow-up with urology.   Acute hypoxemic respiratory failure secondary to septic shock. Morbid obesity and obesity hypoventilation syndrome  Patient condition continued to improve.  Oxygenation gradually getting better.  She also was giving Lasix, currently transition to oral torsemide.  She had some volume overload due to renal failure.     Iron deficiency anemia Acute blood loss anemia. Ruled in Upper GI bleed. Ruled in.  Patient had some black stools while in the hospital, GI consult was obtained, did not recommend any further work-up.  Condition seem to be improved, currently hemoglobin has been stabilized.  She still has some loose stools, she still has a rectal tube.  Area is getting better, will remove rectal tube before discharge.   Uncontrolled type 2 diabetes with hyperglycemia. Patient had an episode of hypoglycemia due to n.p.o. status postprocedure.  Tube feeding resumed after PEG tube, continue insulin.  Dysphagia. Continue tube feeding.  Followed by speech therapist.  Acute kidney injury on chronic kidney disease stage IV  secondary to sepsis and obstructive uropathy. Hypokalemia. Patient initially required hemodialysis, but renal function seem to be  improving on IV Lasix.  She has not been requiring dialysis for the last 10 days, but she appears to have worsening renal function today, nephrology is scheduled to have dialysis tomorrow.  Patient has permacath in place.  Nonsustained ventricular tachycardia. Patient had a 1 episode of nonsustained ventricular tachycardia. lasted about 12 beats.  Cardiogram, ejection fraction 55 to 60%.  No significant valvular abnormality.  Mild left ventricular hypertrophy.  This most likely due to her multiple medical conditions.  She is already on metoprolol.  Consider continue telemetry monitoring the LTAC.  Consults: cardiology, ID, GI, nephrology, general surgery, urology, and vascular surgery  Significant Diagnostic Studies:   CT; Interval percutaneous drainage of the a perirenal fluid collection adjacent to the upper pole of the left kidney with complete evacuation of this collection. Note that the catheter extends through the left ninth intercostal space. Interval development of a small dependent left pleural effusion.   Persistent unchanged curvilinear fluid collection within the left anterior pararenal space measuring up to 10.1 cm in greatest dimension. This does not appear to communicate with the percutaneous drainage catheter placed within the perirenal collection.   Stable bilateral nonobstructing nephrolithiasis. No urolithiasis. Bilateral double-J ureteral stents are in expected position with interval near complete resolution of hydronephrosis. Interval Foley decompression of the bladder.   Aortic Atherosclerosis (ICD10-I70.0).     Electronically Signed   By: Fidela Salisbury M.D.   On: 07/29/2021 21:15   Echo: Left ventricular ejection fraction, by estimation, is 55 to 60%. The left ventricle has normal function. Left ventricular endocardial border not optimally defined to evaluate regional wall motion. There is mild left ventricular hypertrophy. Left ventricular diastolic  parameters were normal. 1. Right ventricular systolic function was not well visualized. The right ventricular size is normal. Tricuspid regurgitation signal is inadequate for assessing PA pressure. 2. 3. Left atrial size was moderately dilated. The mitral valve is abnormal. No evidence of mitral valve regurgitation. No evidence of mitral stenosis. 4. The aortic valve is tricuspid. There is mild calcification of the aortic valve. There is mild thickening of the aortic valve. Aortic valve regurgitation is not visualized. Mild to moderate aortic valve sclerosis/calcification is present, without any evidence of aortic stenosis.    Treatments: Surgical repair of the duodenum, ureter stents, Perirenal abscess drain. Antibiotics with Zosyn, Amphotericin. Multiple blood transfusions.  Discharge Exam: Blood pressure (!) 151/92, pulse 88, temperature 98.8 F (37.1 C), temperature source Axillary, resp. rate (!) 22, height 5' 2.01" (1.575 m), weight (!) 141.5 kg, SpO2 98 %. General appearance: alert, cooperative, and oriented to time place and person today Resp: Decreased breathing sounds with a few crackles in the base. Cardio: regular rate and rhythm, S1, S2 normal, no murmur, click, rub or gallop GI: soft, non-tender; bowel sounds normal; no masses,  no organomegaly Extremities: extremities normal, atraumatic, no cyanosis or edema  Disposition: Discharge disposition: [id number] to [facility] with planned acute hosp IP readmission       Discharge Instructions     Diet general   Complete by: As directed    NPO, Tube feeding as ordered   Discharge wound care:   Complete by: As directed    Followed by LTAC physician and RN   Increase activity slowly   Complete by: As directed       Allergies as of 08/03/2021       Reactions  Contrast Media  [iodinated Diagnostic Agents] Anaphylaxis   Iodine Swelling   (IV only) - angioedema        Medication List     STOP taking  these medications    aspirin EC 81 MG tablet   benzonatate 200 MG capsule Commonly known as: TESSALON   buPROPion 150 MG 24 hr tablet Commonly known as: WELLBUTRIN XL   freestyle lancets   insulin aspart 100 UNIT/ML FlexPen Commonly known as: NovoLOG FlexPen Replaced by: insulin aspart 100 UNIT/ML injection   Insulin Pen Needle 32G X 4 MM Misc   Levemir FlexTouch 100 UNIT/ML FlexPen Generic drug: insulin detemir Replaced by: insulin detemir 100 UNIT/ML injection   levofloxacin 500 MG tablet Commonly known as: LEVAQUIN   losartan 50 MG tablet Commonly known as: COZAAR   metFORMIN 1000 MG tablet Commonly known as: GLUCOPHAGE   Multi-Vitamin tablet   ondansetron 4 MG disintegrating tablet Commonly known as: ZOFRAN-ODT   predniSONE 20 MG tablet Commonly known as: DELTASONE   Trulicity 3.15 QM/0.8QP Sopn Generic drug: Dulaglutide       TAKE these medications    amLODipine 10 MG tablet Commonly known as: NORVASC Place 1 tablet (10 mg total) into feeding tube daily. Start taking on: August 04, 2021 What changed: how to take this   atorvastatin 20 MG tablet Commonly known as: LIPITOR Take 1 tablet (20 mg total) by mouth daily.   cloNIDine 0.1 MG tablet Commonly known as: CATAPRES Place 1 tablet (0.1 mg total) into feeding tube daily. Start taking on: August 04, 2021   epoetin alfa 40000 UNIT/ML injection Commonly known as: EPOGEN Inject 0.5 mLs (20,000 Units total) into the skin once a week. Start taking on: August 04, 2021   free water Soln Place 125 mLs into feeding tube every 4 (four) hours.   heparin 5000 UNIT/ML injection Inject 1 mL (5,000 Units total) into the skin every 8 (eight) hours.   insulin aspart 100 UNIT/ML injection Commonly known as: novoLOG Inject 0-20 Units into the skin every 4 (four) hours. Replaces: insulin aspart 100 UNIT/ML FlexPen   insulin detemir 100 UNIT/ML injection Commonly known as: LEVEMIR Inject 0.3 mLs  (30 Units total) into the skin 2 (two) times daily. Replaces: Levemir FlexTouch 100 UNIT/ML FlexPen   metoprolol tartrate 100 MG tablet Commonly known as: LOPRESSOR Place 1 tablet (100 mg total) into feeding tube 2 (two) times daily. What changed:  medication strength how much to take how to take this   nutrition supplement (JUVEN) Pack Place 1 packet into feeding tube 2 (two) times daily between meals.   feeding supplement (GLUCERNA 1.5 CAL) Liqd Place 1,000 mLs into feeding tube continuous.   pantoprazole sodium 40 mg Pack Commonly known as: PROTONIX Place 20 mLs (40 mg total) into feeding tube 2 (two) times daily.   Torsemide 40 MG Tabs Place 40 mg into feeding tube daily. Start taking on: August 04, 2021               Discharge Care Instructions  (From admission, onward)           Start     Ordered   08/03/21 0000  Discharge wound care:       Comments: Followed by [facility] physician and RN   08/03/21 1023           50 minutes  Signed: Sharen Hones 08/03/2021, 10:23 AM

## 2021-08-06 LAB — AEROBIC/ANAEROBIC CULTURE W GRAM STAIN (SURGICAL/DEEP WOUND): Culture: NO GROWTH

## 2021-08-07 LAB — CULTURE, BLOOD (ROUTINE X 2)

## 2021-09-04 LAB — BLOOD GAS, ARTERIAL
Acid-base deficit: 10.1 mmol/L — ABNORMAL HIGH (ref 0.0–2.0)
Bicarbonate: 18.2 mmol/L — ABNORMAL LOW (ref 20.0–28.0)
FIO2: 1
O2 Saturation: 98.5 %
Patient temperature: 37
pCO2 arterial: 50 mmHg — ABNORMAL HIGH (ref 32.0–48.0)
pH, Arterial: 7.17 — CL (ref 7.350–7.450)
pO2, Arterial: 141 mmHg — ABNORMAL HIGH (ref 83.0–108.0)

## 2021-09-13 ENCOUNTER — Encounter: Payer: Medicare Other | Admitting: Surgery

## 2021-09-22 ENCOUNTER — Ambulatory Visit: Payer: Medicare Other | Admitting: Urology

## 2021-09-26 DIAGNOSIS — N2581 Secondary hyperparathyroidism of renal origin: Secondary | ICD-10-CM | POA: Insufficient documentation

## 2021-09-26 DIAGNOSIS — I129 Hypertensive chronic kidney disease with stage 1 through stage 4 chronic kidney disease, or unspecified chronic kidney disease: Secondary | ICD-10-CM | POA: Insufficient documentation

## 2021-10-05 ENCOUNTER — Ambulatory Visit: Payer: Medicare Other | Admitting: Urology

## 2021-10-10 ENCOUNTER — Other Ambulatory Visit: Payer: Self-pay

## 2021-10-10 ENCOUNTER — Encounter: Payer: Self-pay | Admitting: Internal Medicine

## 2021-10-10 ENCOUNTER — Inpatient Hospital Stay: Payer: Medicare Other | Attending: Internal Medicine | Admitting: Internal Medicine

## 2021-10-10 ENCOUNTER — Inpatient Hospital Stay: Payer: Medicare Other

## 2021-10-10 VITALS — BP 119/56 | HR 83 | Temp 98.2°F | Resp 18

## 2021-10-10 DIAGNOSIS — D631 Anemia in chronic kidney disease: Secondary | ICD-10-CM | POA: Diagnosis not present

## 2021-10-10 DIAGNOSIS — D649 Anemia, unspecified: Secondary | ICD-10-CM

## 2021-10-10 DIAGNOSIS — M1610 Unilateral primary osteoarthritis, unspecified hip: Secondary | ICD-10-CM | POA: Insufficient documentation

## 2021-10-10 DIAGNOSIS — Z801 Family history of malignant neoplasm of trachea, bronchus and lung: Secondary | ICD-10-CM | POA: Insufficient documentation

## 2021-10-10 DIAGNOSIS — Z87891 Personal history of nicotine dependence: Secondary | ICD-10-CM | POA: Diagnosis not present

## 2021-10-10 DIAGNOSIS — F419 Anxiety disorder, unspecified: Secondary | ICD-10-CM | POA: Insufficient documentation

## 2021-10-10 DIAGNOSIS — R5381 Other malaise: Secondary | ICD-10-CM | POA: Insufficient documentation

## 2021-10-10 DIAGNOSIS — Z931 Gastrostomy status: Secondary | ICD-10-CM | POA: Insufficient documentation

## 2021-10-10 DIAGNOSIS — N184 Chronic kidney disease, stage 4 (severe): Secondary | ICD-10-CM | POA: Insufficient documentation

## 2021-10-10 LAB — CBC WITH DIFFERENTIAL/PLATELET
Abs Immature Granulocytes: 0.04 10*3/uL (ref 0.00–0.07)
Basophils Absolute: 0.1 10*3/uL (ref 0.0–0.1)
Basophils Relative: 1 %
Eosinophils Absolute: 0.1 10*3/uL (ref 0.0–0.5)
Eosinophils Relative: 1 %
HCT: 27.7 % — ABNORMAL LOW (ref 36.0–46.0)
Hemoglobin: 8.6 g/dL — ABNORMAL LOW (ref 12.0–15.0)
Immature Granulocytes: 0 %
Lymphocytes Relative: 15 %
Lymphs Abs: 1.6 10*3/uL (ref 0.7–4.0)
MCH: 26 pg (ref 26.0–34.0)
MCHC: 31 g/dL (ref 30.0–36.0)
MCV: 83.7 fL (ref 80.0–100.0)
Monocytes Absolute: 0.7 10*3/uL (ref 0.1–1.0)
Monocytes Relative: 6 %
Neutro Abs: 8.2 10*3/uL — ABNORMAL HIGH (ref 1.7–7.7)
Neutrophils Relative %: 77 %
Platelets: 368 10*3/uL (ref 150–400)
RBC: 3.31 MIL/uL — ABNORMAL LOW (ref 3.87–5.11)
RDW: 16.5 % — ABNORMAL HIGH (ref 11.5–15.5)
WBC: 10.7 10*3/uL — ABNORMAL HIGH (ref 4.0–10.5)
nRBC: 0 % (ref 0.0–0.2)

## 2021-10-10 LAB — COMPREHENSIVE METABOLIC PANEL
ALT: 16 U/L (ref 0–44)
AST: 16 U/L (ref 15–41)
Albumin: 2.8 g/dL — ABNORMAL LOW (ref 3.5–5.0)
Alkaline Phosphatase: 118 U/L (ref 38–126)
Anion gap: 13 (ref 5–15)
BUN: 36 mg/dL — ABNORMAL HIGH (ref 8–23)
CO2: 26 mmol/L (ref 22–32)
Calcium: 9.8 mg/dL (ref 8.9–10.3)
Chloride: 93 mmol/L — ABNORMAL LOW (ref 98–111)
Creatinine, Ser: 1.44 mg/dL — ABNORMAL HIGH (ref 0.44–1.00)
GFR, Estimated: 38 mL/min — ABNORMAL LOW (ref >60)
Glucose, Bld: 132 mg/dL — ABNORMAL HIGH (ref 70–99)
Potassium: 3.7 mmol/L (ref 3.5–5.1)
Sodium: 132 mmol/L — ABNORMAL LOW (ref 135–145)
Total Bilirubin: 0.2 mg/dL — ABNORMAL LOW (ref 0.3–1.2)
Total Protein: 8.1 g/dL (ref 6.5–8.1)

## 2021-10-10 LAB — IRON AND TIBC
Iron: 29 ug/dL (ref 28–170)
Saturation Ratios: 12 % (ref 10.4–31.8)
TIBC: 252 ug/dL (ref 250–450)
UIBC: 223 ug/dL

## 2021-10-10 LAB — FERRITIN: Ferritin: 216 ng/mL (ref 11–307)

## 2021-10-10 NOTE — Assessment & Plan Note (Addendum)
#  Anemia likely secondary to CKD [see below]-September 2022 hemoglobin 8.5/GFR 25.  Recommend labs CBC CMP LDH iron studies ferritin kappa lambda light chain ratio multiple myeloma panel.   # Chronic kidney disease IV as per labs from September 2022-s/p Bil hydronephrolithiasis CKD [Dr.Kolluru] last HD sep 12th, 2022.  #Debility-recent prolonged hospitalization; improving/[facility].  # Thank you Dr.Kolluru for allowing me to participate in the care of your pleasant patient. Please do not hesitate to contact me with questions or concerns in the interim.  # DISPOSITION: # labs today # labs- h&H; retacrit weekly x4- START next week.  # follow up with NP in 4 weeks; labs- cbc/bmp; Possible retacit/ IV venofer- Dr.B

## 2021-10-10 NOTE — Progress Notes (Signed)
[facility] CONSULT NOTE  Patient Care Team: [name], Hershal Coria as PCP - General (Physician Assistant)  CHIEF COMPLAINTS/PURPOSE OF CONSULTATION: anemia  #Anemia chronic kidney disease  #September 2022-septic shock/obstructive uropathy/perforated duodenum/perirenal abscess s/p drain/Candida UTI.   HISTORY OF PRESENTING ILLNESS: in a wheel chair; with husband  Heather Lopez 75 y.o.  female female patient with complicated medical history is referred to [place] for further evaluation recommendations for anemia.  Patient was recently discharged i from the hospital in September 2022 after she was admitted to the hospital for sepsis/septic shock from obstructive uropathy kidney stone.  Complicated by perforated duodenum/perirenal abscess/Candida UTI.  Patient is currently in rehab at [facility].  She seems to be slowly improving.  Patient has a PEG tube in place.  Also she has left pararenal drain.  As per the husband patient has been out of home for "108 days".   Review of Systems  Constitutional:  Positive for malaise/fatigue and weight loss. Negative for chills, diaphoresis and fever.  HENT:  Negative for nosebleeds and sore throat.   Eyes:  Negative for double vision.  Respiratory:  Negative for cough, hemoptysis, sputum production, shortness of breath and wheezing.   Cardiovascular:  Negative for chest pain, palpitations, orthopnea and leg swelling.  Gastrointestinal:  Negative for abdominal pain, blood in stool, constipation, diarrhea, heartburn, melena, nausea and vomiting.  Genitourinary:  Negative for dysuria, frequency and urgency.  Musculoskeletal:  Positive for back pain and joint pain.  Skin: Negative.  Negative for itching and rash.  Neurological:  Negative for dizziness, tingling, focal weakness, weakness and headaches.  Endo/Heme/Allergies:  Does not bruise/bleed easily.  Psychiatric/Behavioral:  Negative for depression. The patient is not nervous/anxious and  does not have insomnia.     MEDICAL HISTORY:  Past Medical History:  Diagnosis Date   Anxiety    Cataract    Depression    Diabetes mellitus without complication ([place])    Gout    History of kidney stones    Hyperlipidemia    Hypertension    Vertigo     SURGICAL HISTORY: Past Surgical History:  Procedure Laterality Date   CATARACT EXTRACTION, BILATERAL Bilateral 2019   COLONOSCOPY WITH PROPOFOL N/A 08/19/2015   Procedure: COLONOSCOPY WITH PROPOFOL;  Surgeon: Lucilla Lame, MD;  Location: [facility];  Service: Endoscopy;  Laterality: N/A;  diabetic - insulin   CYSTOSCOPY W/ RETROGRADES Bilateral 12/22/2019   Procedure: CYSTOSCOPY WITH RETROGRADE PYELOGRAM;  Surgeon: Abbie Sons, MD;  Location: [facility];  Service: Urology;  Laterality: Bilateral;   CYSTOSCOPY W/ URETERAL STENT PLACEMENT Bilateral 07/05/2021   Procedure: CYSTOSCOPY WITH RETROGRADE PYELOGRAM/BILATERAL URETERAL STENT PLACEMENT;  Surgeon: Abbie Sons, MD;  Location: [facility];  Service: Urology;  Laterality: Bilateral;   CYSTOSCOPY WITH BIOPSY N/A 12/22/2019   Procedure: CYSTOSCOPY WITH bladder BIOPSY;  Surgeon: Abbie Sons, MD;  Location: [facility];  Service: Urology;  Laterality: N/A;   CYSTOSCOPY WITH STENT PLACEMENT Right 12/22/2019   Procedure: CYSTOSCOPY WITH STENT PLACEMENT;  Surgeon: Abbie Sons, MD;  Location: [facility];  Service: Urology;  Laterality: Right;   CYSTOSCOPY WITH URETEROSCOPY Bilateral 12/22/2019   Procedure: CYSTOSCOPY WITH URETEROSCOPY;  Surgeon: Abbie Sons, MD;  Location: [facility];  Service: Urology;  Laterality: Bilateral;   CYSTOSCOPY/URETEROSCOPY/HOLMIUM LASER/STENT PLACEMENT Right 12/22/2019   Procedure: CYSTOSCOPY/URETEROSCOPY/HOLMIUM LASER/STENT PLACEMENT;  Surgeon: Abbie Sons, MD;  Location: [facility];  Service: Urology;  Laterality: Right;   DIALYSIS/PERMA CATHETER INSERTION N/A 06/29/2021  Procedure: DIALYSIS/PERMA CATHETER INSERTION;  Surgeon: Algernon Huxley,  MD;  Location: [facility];  Service: Cardiovascular;  Laterality: N/A;   DIALYSIS/PERMA CATHETER INSERTION N/A 07/13/2021   Procedure: DIALYSIS/PERMA CATHETER INSERTION;  Surgeon: Algernon Huxley, MD;  Location: [facility];  Service: Cardiovascular;  Laterality: N/A;   EYE SURGERY     IR GASTROSTOMY TUBE MOD SED  08/01/2021   LAPAROTOMY N/A 06/29/2021   Procedure: EXPLORATORY LAPAROTOMY WITH REPAIR OF DUODENAL PERFORATION;  Surgeon: [name], MD;  Location: [facility];  Service: General;  Laterality: N/A;   POLYPECTOMY  08/19/2015   Procedure: POLYPECTOMY;  Surgeon: Lucilla Lame, MD;  Location: [facility];  Service: Endoscopy;;   TUBAL LIGATION  1992    SOCIAL HISTORY: Social History   Socioeconomic History   Marital status: Married    Spouse name: Not on file   Number of children: Not on file   Years of education: Not on file   Highest education level: Not on file  Occupational History   Not on file  Tobacco Use   Smoking status: Former    Packs/day: 0.75    Years: 6.00    Pack years: 4.50    Types: Cigarettes    Quit date: 19    Years since quitting: 49.9   Smokeless tobacco: Never   Tobacco comments:    quit 1973  Vaping Use   Vaping Use: Never used  Substance and Sexual Activity   Alcohol use: No    Alcohol/week: 0.0 standard drinks   Drug use: No   Sexual activity: Not on file  Other Topics Concern   Not on file  Social History Narrative   Not on file   Social Determinants of Health   Financial Resource Strain: Not on file  Food Insecurity: Not on file  Transportation Needs: Not on file  Physical Activity: Not on file  Stress: Not on file  Social Connections: Not on file  Intimate Partner Violence: Not on file    FAMILY HISTORY: Family History  Problem Relation Age of Onset   Diabetes Father    Cancer Father        lung cancer   Diabetes Brother    Stroke Mother     ALLERGIES:  is allergic to contrast media  [iodinated  diagnostic agents] and iodine.  MEDICATIONS:  Current Outpatient Medications  Medication Sig Dispense Refill   acetaminophen (TYLENOL) 325 MG tablet Take 650 mg by mouth every 6 (six) hours as needed.     Ascorbic Acid 500 MG CHEW 500 mg by Per G Tube route 1 (one) time each day     atorvastatin (LIPITOR) 20 MG tablet Take 1 tablet (20 mg total) by mouth daily. 90 tablet 3   buPROPion (WELLBUTRIN XL) 150 MG 24 hr tablet Place 1 tablet into feeding tube daily     busPIRone (BUSPAR) 5 MG tablet Take 5 mg by mouth 3 (three) times daily.     ciprofloxacin (CIPRO) 400 MG/200ML SOLN Inject 400 mg into the vein every 12 (twelve) hours.     cloNIDine (CATAPRES) 0.1 MG tablet Place 1 tablet (0.1 mg total) into feeding tube daily. 60 tablet 0   ferrous sulfate 220 (44 Fe) MG/5ML solution 220 mg by Per G Tube route 1 (one) time each day     fluticasone (FLONASE) 50 MCG/ACT nasal spray Place into both nostrils daily.     GUAIFENESIN PO Take by mouth.     heparin  5000 UNIT/ML injection Inject 1 mL (5,000 Units total) into the skin every 8 (eight) hours. 1 mL    insulin glargine (LANTUS) 100 UNIT/ML injection Inject 40 Units into the skin daily.     insulin regular (NOVOLIN R) 100 units/mL injection Inject into the skin 3 (three) times daily before meals. Sliding scale     lansoprazole (PREVACID) 30 MG capsule 30 mg Do not crush or chew. GI route     Lidocaine (HM LIDOCAINE PATCH) 4 % PTCH Apply topically.     loperamide (IMODIUM A-D) 2 MG tablet Take 2 mg by mouth 4 (four) times daily as needed for diarrhea or loose stools.     metoprolol tartrate (LOPRESSOR) 100 MG tablet Place 1 tablet (100 mg total) into feeding tube 2 (two) times daily.     Miconazole Nitrate 2 % OINT Apply topically.     mirtazapine (REMERON) 15 MG tablet 15 mg by Per G Tube route     nutrition supplement, JUVEN, (JUVEN) PACK Place 1 packet into feeding tube 2 (two) times daily between meals.  0   Nutritional Supplements (FEEDING  SUPPLEMENT, GLUCERNA 1.5 CAL,) LIQD Place 1,000 mLs into feeding tube continuous.     oxyCODONE-acetaminophen (PERCOCET/ROXICET) 5-325 MG tablet Take by mouth.     polyethylene glycol powder (GLYCOLAX/MIRALAX) 17 GM/SCOOP powder 17 g 1 (one) time each day Gi tube     senna-docusate (SENOKOT-S) 8.6-50 MG tablet 1 tablet by Per G Tube route 1 (one) time each day     sitaGLIPtin (JANUVIA) 25 MG tablet Take by mouth.     sodium zirconium cyclosilicate (LOKELMA) 10 g PACK packet Take by mouth.     vancomycin IVPB Inject into the vein.     amLODipine (NORVASC) 10 MG tablet Place 1 tablet (10 mg total) into feeding tube daily. (Patient not taking: Reported on 10/10/2021)     epoetin alfa (EPOGEN) 40000 UNIT/ML injection Inject 0.5 mLs (20,000 Units total) into the skin once a week. (Patient not taking: Reported on 10/10/2021) 1 mL    Exenatide ER (BYDUREON BCISE) 2 MG/0.85ML AUIJ Bydureon BCise 2 mg/0.85 mL subcutaneous auto-injector (Patient not taking: Reported on 10/10/2021)     insulin aspart (NOVOLOG) 100 UNIT/ML injection Inject 0-20 Units into the skin every 4 (four) hours. (Patient not taking: Reported on 10/10/2021) 10 mL 0   insulin detemir (LEVEMIR) 100 UNIT/ML injection Inject 0.3 mLs (30 Units total) into the skin 2 (two) times daily. (Patient not taking: Reported on 10/10/2021) 10 mL 11   pantoprazole sodium (PROTONIX) 40 mg PACK Place 20 mLs (40 mg total) into feeding tube 2 (two) times daily. (Patient not taking: Reported on 10/10/2021) 30 packet    torsemide 40 MG TABS Place 40 mg into feeding tube daily. (Patient not taking: Reported on 10/10/2021)     Water For Irrigation, Sterile (FREE WATER) SOLN Place 125 mLs into feeding tube every 4 (four) hours. (Patient not taking: Reported on 10/10/2021)     No current facility-administered medications for this visit.      [name]  PHYSICAL EXAMINATION:  Vitals:   10/10/21 1349  BP: (!) 119/56  Pulse: 83  Resp: 18  Temp: 98.2 F (36.8 C)   SpO2: 100%   Filed Weights    Physical Exam Vitals and nursing note reviewed.  HENT:     Head: Normocephalic and atraumatic.     Mouth/Throat:     Pharynx: Oropharynx is clear.  Eyes:     Extraocular Movements: Extraocular  movements intact.     Pupils: Pupils are equal, round, and reactive to light.  Cardiovascular:     Rate and Rhythm: Normal rate and regular rhythm.  Pulmonary:     Comments: Decreased breath sounds bilaterally.  Abdominal:     Palpations: Abdomen is soft.  Musculoskeletal:        General: Normal range of motion.     Cervical back: Normal range of motion.  Skin:    General: Skin is warm.  Neurological:     General: No focal deficit present.     Mental Status: She is alert and oriented to person, place, and time.  Psychiatric:        Behavior: Behavior normal.        Judgment: Judgment normal.     LABORATORY DATA:  I have reviewed the data as listed Lab Results  Component Value Date   WBC 12.2 (H) 08/03/2021   HGB 8.6 (L) 08/03/2021   HCT 27.4 (L) 08/03/2021   MCV 92.6 08/03/2021   PLT 383 08/03/2021   Recent Labs    07/15/21 1036 07/16/21 0444 07/17/21 0434 07/18/21 0511 07/21/21 0528 07/22/21 0516 07/28/21 0602 07/29/21 0615 07/31/21 0459 08/01/21 0411 08/02/21 0214 08/03/21 0310  NA 145   < > 149*   < > 137   < > 144   < > 145  --  146* 144  K 4.0   < > 3.6   < > 2.9*   < > 3.5   < > 3.9 3.8 4.1 3.8  CL 104   < > 111   < > 99   < > 107   < > 108  --  106 102  CO2 28   < > 31   < > 28   < > 28   < > 26  --  28 28  GLUCOSE 208*   < > 109*   < > 223*   < > 120*   < > 136*  --  242* 220*  BUN 67*   < > 83*   < > 60*   < > 88*   < > 97*  --  95* 104*  CREATININE 1.97*   < > 2.02*   < > 2.35*   < > 2.26*   < > 2.01*  --  2.05* 2.34*  CALCIUM 8.4*   < > 8.8*   < > 8.9   < > 9.1   < > 9.6  --  9.7 9.4  GFRNONAA 26*   < > 25*   < > 21*   < > 22*   < > 26*  --  25* 21*  PROT 5.8*   < > 6.0*  --  5.8*  --  6.7  --   --   --  6.9  --    ALBUMIN 1.7*   < > 1.8*  --  1.6*  --  2.0*  --  2.0*  --  2.4*  --   AST 59*   < > 52*  --  33  --  19  --   --   --  25  --   ALT 139*   < > 88*  --  53*  --  31  --   --   --  33  --   ALKPHOS 124   < > 107  --  101  --  98  --   --   --  115  --   BILITOT 0.7   < > 0.5  --  0.5  --  0.7  --   --   --  0.7  --   BILIDIR 0.1  --  <0.1  --  <0.1  --   --   --   --   --   --   --   IBILI 0.6  --  NOT CALCULATED  --  NOT CALCULATED  --   --   --   --   --   --   --    < > = values in this interval not displayed.    RADIOGRAPHIC STUDIES: I have personally reviewed the radiological images as listed and agreed with the findings in the report. No results found.  Symptomatic anemia # Anemia likely secondary to CKD [see below]-September 2022 hemoglobin 8.5/GFR 25.  Recommend labs CBC CMP LDH iron studies ferritin kappa lambda light chain ratio multiple myeloma panel.   # Chronic kidney disease IV as per labs from September 2022-s/p Bil hydronephrolithiasis CKD [Dr.Kolluru] last HD sep 12th, 2022.  #Debility-recent prolonged hospitalization; improving/rehab [facility].  # Thank you Dr.Kolluru for allowing me to participate in the care of your pleasant patient. Please do not hesitate to contact me with questions or concerns in the interim.  # DISPOSITION: # labs today # labs- h&H; retacrit weekly x4- START next week.  # follow up with NP in 4 weeks; labs- cbc/bmp; Possible retacit/ IV venofer- Dr.B    All questions were answered. The patient knows to call the clinic with any problems, questions or concerns.       Cammie Sickle, MD 10/10/2021 3:03 PM

## 2021-10-11 DIAGNOSIS — R5381 Other malaise: Secondary | ICD-10-CM | POA: Diagnosis not present

## 2021-10-11 DIAGNOSIS — D631 Anemia in chronic kidney disease: Secondary | ICD-10-CM | POA: Diagnosis present

## 2021-10-11 DIAGNOSIS — Z931 Gastrostomy status: Secondary | ICD-10-CM | POA: Diagnosis not present

## 2021-10-11 DIAGNOSIS — Z801 Family history of malignant neoplasm of trachea, bronchus and lung: Secondary | ICD-10-CM | POA: Diagnosis not present

## 2021-10-11 DIAGNOSIS — N184 Chronic kidney disease, stage 4 (severe): Secondary | ICD-10-CM | POA: Diagnosis present

## 2021-10-11 DIAGNOSIS — Z87891 Personal history of nicotine dependence: Secondary | ICD-10-CM | POA: Diagnosis not present

## 2021-10-11 LAB — KAPPA/LAMBDA LIGHT CHAINS
Kappa free light chain: 150.6 mg/L — ABNORMAL HIGH (ref 3.3–19.4)
Kappa, lambda light chain ratio: 1.62 (ref 0.26–1.65)
Lambda free light chains: 93.2 mg/L — ABNORMAL HIGH (ref 5.7–26.3)

## 2021-10-11 LAB — HAPTOGLOBIN: Haptoglobin: 565 mg/dL — ABNORMAL HIGH (ref 42–346)

## 2021-10-16 LAB — IMMUNOFIXATION ELECTROPHORESIS
IgA: 386 mg/dL (ref 64–422)
IgG (Immunoglobin G), Serum: 1255 mg/dL (ref 586–1602)
IgM (Immunoglobulin M), Srm: 138 mg/dL (ref 26–217)
Total Protein ELP: 7 g/dL (ref 6.0–8.5)

## 2021-10-17 ENCOUNTER — Inpatient Hospital Stay: Payer: Medicare Other

## 2021-10-17 ENCOUNTER — Other Ambulatory Visit: Payer: Self-pay

## 2021-10-17 VITALS — BP 114/48 | HR 68 | Temp 97.9°F | Resp 20

## 2021-10-17 DIAGNOSIS — D649 Anemia, unspecified: Secondary | ICD-10-CM

## 2021-10-17 DIAGNOSIS — N184 Chronic kidney disease, stage 4 (severe): Secondary | ICD-10-CM | POA: Diagnosis not present

## 2021-10-17 LAB — HEMOGLOBIN AND HEMATOCRIT, BLOOD
HCT: 27.1 % — ABNORMAL LOW (ref 36.0–46.0)
Hemoglobin: 8.4 g/dL — ABNORMAL LOW (ref 12.0–15.0)

## 2021-10-17 MED ORDER — EPOETIN ALFA-EPBX 40000 UNIT/ML IJ SOLN
20000.0000 [IU] | Freq: Once | INTRAMUSCULAR | Status: DC
  Filled 2021-10-17: qty 1

## 2021-10-17 MED ORDER — EPOETIN ALFA-EPBX 10000 UNIT/ML IJ SOLN
20000.0000 [IU] | Freq: Once | INTRAMUSCULAR | Status: AC
  Administered 2021-10-17: 20000 [IU] via SUBCUTANEOUS
  Filled 2021-10-17: qty 2

## 2021-10-17 NOTE — Patient Instructions (Signed)
Epoetin Alfa injection °What is this medication? °EPOETIN ALFA (e POE e tin AL fa) helps your body make more red blood cells. This medicine is used to treat anemia caused by chronic kidney disease, cancer chemotherapy, or HIV-therapy. It may also be used before surgery if you have anemia. °This medicine may be used for other purposes; ask your health care provider or pharmacist if you have questions. °COMMON BRAND NAME(S): Epogen, Procrit, Retacrit °What should I tell my care team before I take this medication? °They need to know if you have any of these conditions: °cancer °heart disease °high blood pressure °history of blood clots °history of stroke °low levels of folate, iron, or vitamin B12 in the blood °seizures °an unusual or allergic reaction to erythropoietin, albumin, benzyl alcohol, hamster proteins, other medicines, foods, dyes, or preservatives °pregnant or trying to get pregnant °breast-feeding °How should I use this medication? °This medicine is for injection into a vein or under the skin. It is usually given by a health care professional in a hospital or clinic setting. °If you get this medicine at home, you will be taught how to prepare and give this medicine. Use exactly as directed. Take your medicine at regular intervals. Do not take your medicine more often than directed. °It is important that you put your used needles and syringes in a special sharps container. Do not put them in a trash can. If you do not have a sharps container, call your pharmacist or healthcare provider to get one. °A special MedGuide will be given to you by the pharmacist with each prescription and refill. Be sure to read this information carefully each time. °Talk to your pediatrician regarding the use of this medicine in children. While this drug may be prescribed for selected conditions, precautions do apply. °Overdosage: If you think you have taken too much of this medicine contact a poison control center or emergency  room at once. °NOTE: This medicine is only for you. Do not share this medicine with others. °What if I miss a dose? °If you miss a dose, take it as soon as you can. If it is almost time for your next dose, take only that dose. Do not take double or extra doses. °What may interact with this medication? °Interactions have not been studied. °This list may not describe all possible interactions. Give your health care provider a list of all the medicines, herbs, non-prescription drugs, or dietary supplements you use. Also tell them if you smoke, drink alcohol, or use illegal drugs. Some items may interact with your medicine. °What should I watch for while using this medication? °Your condition will be monitored carefully while you are receiving this medicine. °You may need blood work done while you are taking this medicine. °This medicine may cause a decrease in vitamin B6. You should make sure that you get enough vitamin B6 while you are taking this medicine. Discuss the foods you eat and the vitamins you take with your health care professional. °What side effects may I notice from receiving this medication? °Side effects that you should report to your doctor or health care professional as soon as possible: °allergic reactions like skin rash, itching or hives, swelling of the face, lips, or tongue °seizures °signs and symptoms of a blood clot such as breathing problems; changes in vision; chest pain; severe, sudden headache; pain, swelling, warmth in the leg; trouble speaking; sudden numbness or weakness of the face, arm or leg °signs and symptoms of a stroke like   changes in vision; confusion; trouble speaking or understanding; severe headaches; sudden numbness or weakness of the face, arm or leg; trouble walking; dizziness; loss of balance or coordination °Side effects that usually do not require medical attention (report to your doctor or health care professional if they continue or are  bothersome): °chills °cough °dizziness °fever °headaches °joint pain °muscle cramps °muscle pain °nausea, vomiting °pain, redness, or irritation at site where injected °This list may not describe all possible side effects. Call your doctor for medical advice about side effects. You may report side effects to FDA at 1-800-FDA-1088. °Where should I keep my medication? °Keep out of the reach of children. °Store in a refrigerator between 2 and 8 degrees C (36 and 46 degrees F). Do not freeze or shake. Throw away any unused portion if using a single-dose vial. Multi-dose vials can be kept in the refrigerator for up to 21 days after the initial dose. Throw away unused medicine. °NOTE: This sheet is a summary. It may not cover all possible information. If you have questions about this medicine, talk to your doctor, pharmacist, or health care provider. °© 2022 [employer] (2017-07-16 00:00:00) ° °

## 2021-10-24 ENCOUNTER — Inpatient Hospital Stay: Payer: Medicare Other

## 2021-10-24 ENCOUNTER — Other Ambulatory Visit: Payer: Self-pay

## 2021-10-24 VITALS — BP 107/67 | HR 73

## 2021-10-24 DIAGNOSIS — N184 Chronic kidney disease, stage 4 (severe): Secondary | ICD-10-CM | POA: Diagnosis not present

## 2021-10-24 DIAGNOSIS — D649 Anemia, unspecified: Secondary | ICD-10-CM

## 2021-10-24 LAB — HEMOGLOBIN AND HEMATOCRIT, BLOOD
HCT: 28.5 % — ABNORMAL LOW (ref 36.0–46.0)
Hemoglobin: 8.6 g/dL — ABNORMAL LOW (ref 12.0–15.0)

## 2021-10-24 MED ORDER — EPOETIN ALFA-EPBX 10000 UNIT/ML IJ SOLN
20000.0000 [IU] | Freq: Once | INTRAMUSCULAR | Status: AC
  Administered 2021-10-24: 20000 [IU] via SUBCUTANEOUS

## 2021-10-26 ENCOUNTER — Encounter: Payer: Self-pay | Admitting: Internal Medicine

## 2021-10-26 ENCOUNTER — Other Ambulatory Visit: Payer: Self-pay

## 2021-10-26 ENCOUNTER — Ambulatory Visit (INDEPENDENT_AMBULATORY_CARE_PROVIDER_SITE_OTHER): Payer: Medicare Other | Admitting: Urology

## 2021-10-26 ENCOUNTER — Encounter: Payer: Self-pay | Admitting: Urology

## 2021-10-26 VITALS — BP 140/72 | HR 80 | Ht 63.0 in | Wt 203.0 lb

## 2021-10-26 DIAGNOSIS — N1330 Unspecified hydronephrosis: Secondary | ICD-10-CM

## 2021-10-26 DIAGNOSIS — N200 Calculus of kidney: Secondary | ICD-10-CM | POA: Diagnosis not present

## 2021-10-26 DIAGNOSIS — N151 Renal and perinephric abscess: Secondary | ICD-10-CM

## 2021-10-26 NOTE — Progress Notes (Signed)
10/26/2021 2:13 PM   [name] 1945-12-09 [id number]  Referring provider: Lavonia Dana, MD [address] [place],  [place] [address]  Chief Complaint  Patient presents with   Hydronephrosis    HPI: 75 y.o. female presents for follow-up of an extended hospitalization and rehab admission.  She presents today with her husband  Admitted 06/27/2021 with acute renal failure (creatinine 9.3), septic shock, acute respiratory failure.  CT showed acute abdomen secondary to perforated duodenum She underwent exploratory laparotomy with repair of perforated duodenal ulcer History of chronic right hydroureteronephrosis but was also found to have a 14 mm left UPJ stone with mild left hydronephrosis and possible left upper pole renal abscess Underwent emergent left ureteral stent placement with intraoperative findings of left pyonephrosis.  Although there was no obstruction on the right retrograde pyelogram she also had right pyelonephrosis and a right ureteral stent was also placed Intraoperative cultures positive for Candida glabrata and was started on high-dose Diflucan Had persistent fever and underwent percutaneous drainage of a left perirenal abscess by IR Discharged to [facility] 08/03/2021 and currently at [facility]  PMH: Past Medical History:  Diagnosis Date   Anxiety    Cataract    Depression    Diabetes mellitus without complication ([place])    Gout    History of kidney stones    Hyperlipidemia    Hypertension    Vertigo     Surgical History: Past Surgical History:  Procedure Laterality Date   CATARACT EXTRACTION, BILATERAL Bilateral 2019   COLONOSCOPY WITH PROPOFOL N/A 08/19/2015   Procedure: COLONOSCOPY WITH PROPOFOL;  Surgeon: Lucilla Lame, MD;  Location: [place];  Service: Endoscopy;  Laterality: N/A;  diabetic - insulin   CYSTOSCOPY W/ RETROGRADES Bilateral 12/22/2019   Procedure: CYSTOSCOPY WITH RETROGRADE PYELOGRAM;  Surgeon: Abbie Sons,  MD;  Location: [facility];  Service: Urology;  Laterality: Bilateral;   CYSTOSCOPY W/ URETERAL STENT PLACEMENT Bilateral 07/05/2021   Procedure: CYSTOSCOPY WITH RETROGRADE PYELOGRAM/BILATERAL URETERAL STENT PLACEMENT;  Surgeon: Abbie Sons, MD;  Location: [facility];  Service: Urology;  Laterality: Bilateral;   CYSTOSCOPY WITH BIOPSY N/A 12/22/2019   Procedure: CYSTOSCOPY WITH bladder BIOPSY;  Surgeon: Abbie Sons, MD;  Location: [facility];  Service: Urology;  Laterality: N/A;   CYSTOSCOPY WITH STENT PLACEMENT Right 12/22/2019   Procedure: CYSTOSCOPY WITH STENT PLACEMENT;  Surgeon: Abbie Sons, MD;  Location: [facility];  Service: Urology;  Laterality: Right;   CYSTOSCOPY WITH URETEROSCOPY Bilateral 12/22/2019   Procedure: CYSTOSCOPY WITH URETEROSCOPY;  Surgeon: Abbie Sons, MD;  Location: [facility];  Service: Urology;  Laterality: Bilateral;   CYSTOSCOPY/URETEROSCOPY/HOLMIUM LASER/STENT PLACEMENT Right 12/22/2019   Procedure: CYSTOSCOPY/URETEROSCOPY/HOLMIUM LASER/STENT PLACEMENT;  Surgeon: Abbie Sons, MD;  Location: [facility];  Service: Urology;  Laterality: Right;   DIALYSIS/PERMA CATHETER INSERTION N/A 06/29/2021   Procedure: DIALYSIS/PERMA CATHETER INSERTION;  Surgeon: Algernon Huxley, MD;  Location: [facility];  Service: Cardiovascular;  Laterality: N/A;   DIALYSIS/PERMA CATHETER INSERTION N/A 07/13/2021   Procedure: DIALYSIS/PERMA CATHETER INSERTION;  Surgeon: Algernon Huxley, MD;  Location: [place];  Service: Cardiovascular;  Laterality: N/A;   EYE SURGERY     IR GASTROSTOMY TUBE MOD SED  08/01/2021   LAPAROTOMY N/A 06/29/2021   Procedure: EXPLORATORY LAPAROTOMY WITH REPAIR OF DUODENAL PERFORATION;  Surgeon: [name], MD;  Location: [facility];  Service: General;  Laterality: N/A;   POLYPECTOMY  08/19/2015   Procedure: POLYPECTOMY;  Surgeon: Evangeline Gula  Allen Norris, MD;  Location: [facility];  Service: Endoscopy;;   TUBAL LIGATION  1992    Home Medications:   Allergies as of 10/26/2021       Reactions   Contrast Media  [iodinated Diagnostic Agents] Anaphylaxis   Iodine Swelling   (IV only) - angioedema        Medication List        Accurate as of October 26, 2021  2:13 PM. If you have any questions, ask your nurse or doctor.          acetaminophen 325 MG tablet Commonly known as: TYLENOL Take 650 mg by mouth every 6 (six) hours as needed.   amLODipine 10 MG tablet Commonly known as: NORVASC Place 1 tablet (10 mg total) into feeding tube daily.   Ascorbic Acid 500 MG Chew 500 mg by Per G Tube route 1 (one) time each day   atorvastatin 20 MG tablet Commonly known as: LIPITOR Take 1 tablet (20 mg total) by mouth daily.   buPROPion 150 MG 24 hr tablet Commonly known as: WELLBUTRIN XL Place 1 tablet into feeding tube daily   busPIRone 5 MG tablet Commonly known as: BUSPAR Take 5 mg by mouth 3 (three) times daily.   Bydureon BCise 2 MG/0.85ML Auij Generic drug: Exenatide ER   ciprofloxacin 400 MG/200ML Soln Commonly known as: CIPRO Inject 400 mg into the vein every 12 (twelve) hours.   cloNIDine 0.1 MG tablet Commonly known as: CATAPRES Place 1 tablet (0.1 mg total) into feeding tube daily.   epoetin alfa 40000 UNIT/ML injection Commonly known as: EPOGEN Inject 0.5 mLs (20,000 Units total) into the skin once a week.   ferrous sulfate 220 (44 Fe) MG/5ML solution 220 mg by Per G Tube route 1 (one) time each day   fluticasone 50 MCG/ACT nasal spray Commonly known as: FLONASE Place into both nostrils daily.   free water Soln Place 125 mLs into feeding tube every 4 (four) hours.   GUAIFENESIN PO Take by mouth.   heparin 5000 UNIT/ML injection Inject 1 mL (5,000 Units total) into the skin every 8 (eight) hours.   insulin aspart 100 UNIT/ML injection Commonly known as: novoLOG Inject 0-20 Units into the skin every 4 (four) hours.   insulin detemir 100 UNIT/ML injection Commonly known as: LEVEMIR Inject  0.3 mLs (30 Units total) into the skin 2 (two) times daily.   insulin glargine 100 UNIT/ML injection Commonly known as: LANTUS Inject 40 Units into the skin daily.   insulin regular 100 units/mL injection Commonly known as: NOVOLIN R Inject into the skin 3 (three) times daily before meals. Sliding scale   lansoprazole 30 MG capsule Commonly known as: PREVACID 30 mg Do not crush or chew. GI route   Lidocaine 4 % Ptch Apply topically.   Lokelma 10 g Pack packet Generic drug: sodium zirconium cyclosilicate Take by mouth.   loperamide 2 MG tablet Commonly known as: IMODIUM A-D Take 2 mg by mouth 4 (four) times daily as needed for diarrhea or loose stools.   metoprolol tartrate 100 MG tablet Commonly known as: LOPRESSOR Place 1 tablet (100 mg total) into feeding tube 2 (two) times daily.   Miconazole Nitrate 2 % Oint Apply topically.   mirtazapine 15 MG tablet Commonly known as: REMERON 15 mg by Per G Tube route   nutrition supplement (JUVEN) Pack Place 1 packet into feeding tube 2 (two) times daily between meals.   feeding supplement (GLUCERNA 1.5 CAL) Liqd Place 1,000  mLs into feeding tube continuous.   oxyCODONE-acetaminophen 5-325 MG tablet Commonly known as: PERCOCET/ROXICET Take by mouth.   pantoprazole sodium 40 mg Commonly known as: PROTONIX Place 20 mLs (40 mg total) into feeding tube 2 (two) times daily.   polyethylene glycol powder 17 GM/SCOOP powder Commonly known as: GLYCOLAX/MIRALAX 17 g 1 (one) time each day Gi tube   senna-docusate 8.6-50 MG tablet Commonly known as: Senokot-S 1 tablet by Per G Tube route 1 (one) time each day   sitaGLIPtin 25 MG tablet Commonly known as: JANUVIA Take by mouth.   Torsemide 40 MG Tabs Place 40 mg into feeding tube daily.   vancomycin  IVPB Inject into the vein.        Allergies:  Allergies  Allergen Reactions   Contrast Media  [Iodinated Diagnostic Agents] Anaphylaxis   Iodine Swelling    (IV  only) - angioedema    Family History: Family History  Problem Relation Age of Onset   Diabetes Father    Cancer Father        lung cancer   Diabetes Brother    Stroke Mother     Social History:  reports that she quit smoking about 49 years ago. Her smoking use included cigarettes. She has a 4.50 pack-year smoking history. She has never used smokeless tobacco. She reports that she does not drink alcohol and does not use drugs.   Physical Exam: BP 140/72   Pulse 80   Ht 5\' 3"  (1.6 m)   Wt 203 lb (92.1 kg)   BMI 35.96 kg/m   Constitutional:  Alert and oriented, No acute distress. HEENT: [place] AT, moist mucus membranes.  Trachea midline, no masses. Cardiovascular: No clubbing, cyanosis, or edema. Respiratory: Normal respiratory effort, no increased work of breathing. GU: Perirenal drain with purulent fluid Skin: No rashes, bruises or suspicious lesions. Psychiatric: Normal mood and affect.   Assessment & Plan:    1.  Left perirenal abscess Percutaneous perirenal drain ~ 8 weeks Schedule follow-up renal CT Will need ID follow-up  2.  Left UPJ calculus Will need to schedule follow-up ureteroscopy for definitive stone treatment  3.  Right hydronephrosis No evidence of obstruction on retrograde pyelogram Recommend cystogram to assess for reflux at the time of left stone treatment and repeat retrograde if no evidence of reflux   Abbie Sons, MD  [facility] [address] [place], [place] [address] [phone]

## 2021-10-28 ENCOUNTER — Encounter: Payer: Self-pay | Admitting: Urology

## 2021-10-31 ENCOUNTER — Inpatient Hospital Stay: Payer: Medicare Other | Attending: Nurse Practitioner

## 2021-10-31 ENCOUNTER — Other Ambulatory Visit: Payer: Self-pay

## 2021-10-31 ENCOUNTER — Inpatient Hospital Stay: Payer: Medicare Other

## 2021-10-31 VITALS — BP 139/73 | HR 80

## 2021-10-31 DIAGNOSIS — N184 Chronic kidney disease, stage 4 (severe): Secondary | ICD-10-CM | POA: Insufficient documentation

## 2021-10-31 DIAGNOSIS — D649 Anemia, unspecified: Secondary | ICD-10-CM

## 2021-10-31 DIAGNOSIS — D631 Anemia in chronic kidney disease: Secondary | ICD-10-CM | POA: Insufficient documentation

## 2021-10-31 DIAGNOSIS — E611 Iron deficiency: Secondary | ICD-10-CM | POA: Diagnosis present

## 2021-10-31 LAB — CBC WITH DIFFERENTIAL/PLATELET
Abs Immature Granulocytes: 0.06 10*3/uL (ref 0.00–0.07)
Basophils Absolute: 0.1 10*3/uL (ref 0.0–0.1)
Basophils Relative: 1 %
Eosinophils Absolute: 0.2 10*3/uL (ref 0.0–0.5)
Eosinophils Relative: 2 %
HCT: 31.3 % — ABNORMAL LOW (ref 36.0–46.0)
Hemoglobin: 9.4 g/dL — ABNORMAL LOW (ref 12.0–15.0)
Immature Granulocytes: 1 %
Lymphocytes Relative: 14 %
Lymphs Abs: 1.5 10*3/uL (ref 0.7–4.0)
MCH: 25.5 pg — ABNORMAL LOW (ref 26.0–34.0)
MCHC: 30 g/dL (ref 30.0–36.0)
MCV: 85.1 fL (ref 80.0–100.0)
Monocytes Absolute: 0.8 10*3/uL (ref 0.1–1.0)
Monocytes Relative: 7 %
Neutro Abs: 8.2 10*3/uL — ABNORMAL HIGH (ref 1.7–7.7)
Neutrophils Relative %: 75 %
Platelets: 331 10*3/uL (ref 150–400)
RBC: 3.68 MIL/uL — ABNORMAL LOW (ref 3.87–5.11)
RDW: 17 % — ABNORMAL HIGH (ref 11.5–15.5)
WBC: 10.9 10*3/uL — ABNORMAL HIGH (ref 4.0–10.5)
nRBC: 0 % (ref 0.0–0.2)

## 2021-10-31 LAB — BASIC METABOLIC PANEL WITH GFR
Anion gap: 12 (ref 5–15)
BUN: 24 mg/dL — ABNORMAL HIGH (ref 8–23)
CO2: 25 mmol/L (ref 22–32)
Calcium: 9.4 mg/dL (ref 8.9–10.3)
Chloride: 99 mmol/L (ref 98–111)
Creatinine, Ser: 1.53 mg/dL — ABNORMAL HIGH (ref 0.44–1.00)
GFR, Estimated: 35 mL/min — ABNORMAL LOW
Glucose, Bld: 126 mg/dL — ABNORMAL HIGH (ref 70–99)
Potassium: 3.3 mmol/L — ABNORMAL LOW (ref 3.5–5.1)
Sodium: 136 mmol/L (ref 135–145)

## 2021-10-31 MED ORDER — EPOETIN ALFA-EPBX 10000 UNIT/ML IJ SOLN
20000.0000 [IU] | Freq: Once | INTRAMUSCULAR | Status: AC
  Administered 2021-10-31: 20000 [IU] via SUBCUTANEOUS
  Filled 2021-10-31: qty 2

## 2021-11-02 ENCOUNTER — Other Ambulatory Visit: Payer: Self-pay | Admitting: *Deleted

## 2021-11-02 DIAGNOSIS — D649 Anemia, unspecified: Secondary | ICD-10-CM

## 2021-11-07 ENCOUNTER — Other Ambulatory Visit: Payer: Self-pay | Admitting: *Deleted

## 2021-11-07 ENCOUNTER — Telehealth: Payer: Self-pay | Admitting: *Deleted

## 2021-11-07 ENCOUNTER — Other Ambulatory Visit: Payer: Self-pay | Admitting: Urology

## 2021-11-07 ENCOUNTER — Inpatient Hospital Stay (HOSPITAL_BASED_OUTPATIENT_CLINIC_OR_DEPARTMENT_OTHER): Payer: Medicare Other | Admitting: Nurse Practitioner

## 2021-11-07 ENCOUNTER — Inpatient Hospital Stay: Payer: Medicare Other

## 2021-11-07 ENCOUNTER — Ambulatory Visit
Admission: RE | Admit: 2021-11-07 | Discharge: 2021-11-07 | Disposition: A | Discharge status: Home / Self Care | Payer: Medicare Other | Source: Ambulatory Visit | Attending: Urology | Admitting: Urology

## 2021-11-07 ENCOUNTER — Other Ambulatory Visit: Payer: Self-pay

## 2021-11-07 ENCOUNTER — Encounter: Payer: Self-pay | Admitting: Nurse Practitioner

## 2021-11-07 VITALS — BP 113/56 | HR 73 | Temp 98.7°F | Resp 19

## 2021-11-07 DIAGNOSIS — N151 Renal and perinephric abscess: Secondary | ICD-10-CM | POA: Insufficient documentation

## 2021-11-07 DIAGNOSIS — D649 Anemia, unspecified: Secondary | ICD-10-CM

## 2021-11-07 DIAGNOSIS — D631 Anemia in chronic kidney disease: Secondary | ICD-10-CM

## 2021-11-07 DIAGNOSIS — N184 Chronic kidney disease, stage 4 (severe): Secondary | ICD-10-CM | POA: Diagnosis not present

## 2021-11-07 DIAGNOSIS — N1832 Chronic kidney disease, stage 3b: Secondary | ICD-10-CM | POA: Diagnosis not present

## 2021-11-07 DIAGNOSIS — N189 Chronic kidney disease, unspecified: Secondary | ICD-10-CM

## 2021-11-07 LAB — BASIC METABOLIC PANEL
Anion gap: 12 (ref 5–15)
BUN: 19 mg/dL (ref 8–23)
CO2: 23 mmol/L (ref 22–32)
Calcium: 9.2 mg/dL (ref 8.9–10.3)
Chloride: 101 mmol/L (ref 98–111)
Creatinine, Ser: 1.76 mg/dL — ABNORMAL HIGH (ref 0.44–1.00)
GFR, Estimated: 30 mL/min — ABNORMAL LOW (ref >60)
Glucose, Bld: 110 mg/dL — ABNORMAL HIGH (ref 70–99)
Potassium: 2.8 mmol/L — ABNORMAL LOW (ref 3.5–5.1)
Sodium: 136 mmol/L (ref 135–145)

## 2021-11-07 LAB — CBC WITH DIFFERENTIAL/PLATELET
Abs Immature Granulocytes: 0.04 10*3/uL (ref 0.00–0.07)
Basophils Absolute: 0 10*3/uL (ref 0.0–0.1)
Basophils Relative: 0 %
Eosinophils Absolute: 0.2 10*3/uL (ref 0.0–0.5)
Eosinophils Relative: 2 %
HCT: 31.1 % — ABNORMAL LOW (ref 36.0–46.0)
Hemoglobin: 9.6 g/dL — ABNORMAL LOW (ref 12.0–15.0)
Immature Granulocytes: 0 %
Lymphocytes Relative: 16 %
Lymphs Abs: 1.6 10*3/uL (ref 0.7–4.0)
MCH: 25.9 pg — ABNORMAL LOW (ref 26.0–34.0)
MCHC: 30.9 g/dL (ref 30.0–36.0)
MCV: 83.8 fL (ref 80.0–100.0)
Monocytes Absolute: 0.8 10*3/uL (ref 0.1–1.0)
Monocytes Relative: 8 %
Neutro Abs: 7.2 10*3/uL (ref 1.7–7.7)
Neutrophils Relative %: 74 %
Platelets: 284 10*3/uL (ref 150–400)
RBC: 3.71 MIL/uL — ABNORMAL LOW (ref 3.87–5.11)
RDW: 16.9 % — ABNORMAL HIGH (ref 11.5–15.5)
WBC: 10 10*3/uL (ref 4.0–10.5)
nRBC: 0 % (ref 0.0–0.2)

## 2021-11-07 MED ORDER — SODIUM CHLORIDE 0.9 % IV SOLN: 200.0000 mg | Freq: Once | INTRAVENOUS | Status: DC

## 2021-11-07 MED ORDER — SODIUM CHLORIDE 0.9 % IV SOLN
Freq: Once | INTRAVENOUS | Status: AC
  Filled 2021-11-07: qty 250

## 2021-11-07 MED ORDER — IRON SUCROSE 20 MG/ML IV SOLN
200.0000 mg | Freq: Once | INTRAVENOUS | Status: AC
  Administered 2021-11-07: 200 mg via INTRAVENOUS
  Filled 2021-11-07: qty 10

## 2021-11-07 MED ORDER — POTASSIUM CHLORIDE CRYS ER 20 MEQ PO TBCR
20.0000 meq | EXTENDED_RELEASE_TABLET | Freq: Two times a day (BID) | ORAL | 0 refills | Status: DC
Start: 2021-11-07 — End: 2021-12-12

## 2021-11-07 NOTE — Telephone Encounter (Signed)
Stat ct scan per shannon .  Patient is at cancer center now.  I have called kim at [facility] advised about stat ct @ [facility]  after infusion  Called husband also to let him know Heather Lopez need to get that stat ct scan done today after her infusion .

## 2021-11-07 NOTE — Progress Notes (Unsigned)
[facility] CONSULT NOTE  Patient Care Team: [name], Hershal Coria as PCP - General (Physician Assistant)  CHIEF COMPLAINTS/PURPOSE OF CONSULTATION: anemia  #Anemia chronic kidney disease  #September 2022-septic shock/obstructive uropathy/perforated duodenum/perirenal abscess s/p drain/Candida UTI.   HISTORY OF PRESENTING ILLNESS: in a wheel chair; with husband  Heather Lopez 75 y.o.  female female patient with complicated medical history is referred to [place] for further evaluation recommendations for anemia.  Patient was recently discharged i from the hospital in September 2022 after she was admitted to the hospital for sepsis/septic shock from obstructive uropathy kidney stone.  Complicated by perforated duodenum/perirenal abscess/Candida UTI.  Patient is currently in rehab at [facility].  She seems to be slowly improving.  Patient has a PEG tube in place.  Also she has left pararenal drain.   Review of Systems  Constitutional:  Positive for malaise/fatigue and weight loss. Negative for chills, diaphoresis and fever.  HENT:  Negative for nosebleeds and sore throat.   Eyes:  Negative for double vision.  Respiratory:  Negative for cough, hemoptysis, sputum production, shortness of breath and wheezing.   Cardiovascular:  Negative for chest pain, palpitations, orthopnea and leg swelling.  Gastrointestinal:  Negative for abdominal pain, blood in stool, constipation, diarrhea, heartburn, melena, nausea and vomiting.  Genitourinary:  Negative for dysuria, frequency and urgency.  Musculoskeletal:  Positive for back pain and joint pain.  Skin: Negative.  Negative for itching and rash.  Neurological:  Negative for dizziness, tingling, focal weakness, weakness and headaches.  Endo/Heme/Allergies:  Does not bruise/bleed easily.  Psychiatric/Behavioral:  Negative for depression. The patient is not nervous/anxious and does not have insomnia.     MEDICAL HISTORY:  Past Medical  History:  Diagnosis Date   Anxiety    Cataract    Depression    Diabetes mellitus without complication ([place])    Gout    History of kidney stones    Hyperlipidemia    Hypertension    Vertigo     SURGICAL HISTORY: Past Surgical History:  Procedure Laterality Date   CATARACT EXTRACTION, BILATERAL Bilateral 2019   COLONOSCOPY WITH PROPOFOL N/A 08/19/2015   Procedure: COLONOSCOPY WITH PROPOFOL;  Surgeon: Lucilla Lame, MD;  Location: [facility];  Service: Endoscopy;  Laterality: N/A;  diabetic - insulin   CYSTOSCOPY W/ RETROGRADES Bilateral 12/22/2019   Procedure: CYSTOSCOPY WITH RETROGRADE PYELOGRAM;  Surgeon: Abbie Sons, MD;  Location: [facility];  Service: Urology;  Laterality: Bilateral;   CYSTOSCOPY W/ URETERAL STENT PLACEMENT Bilateral 07/05/2021   Procedure: CYSTOSCOPY WITH RETROGRADE PYELOGRAM/BILATERAL URETERAL STENT PLACEMENT;  Surgeon: Abbie Sons, MD;  Location: [facility];  Service: Urology;  Laterality: Bilateral;   CYSTOSCOPY WITH BIOPSY N/A 12/22/2019   Procedure: CYSTOSCOPY WITH bladder BIOPSY;  Surgeon: Abbie Sons, MD;  Location: [facility];  Service: Urology;  Laterality: N/A;   CYSTOSCOPY WITH STENT PLACEMENT Right 12/22/2019   Procedure: CYSTOSCOPY WITH STENT PLACEMENT;  Surgeon: Abbie Sons, MD;  Location: [facility];  Service: Urology;  Laterality: Right;   CYSTOSCOPY WITH URETEROSCOPY Bilateral 12/22/2019   Procedure: CYSTOSCOPY WITH URETEROSCOPY;  Surgeon: Abbie Sons, MD;  Location: [facility];  Service: Urology;  Laterality: Bilateral;   CYSTOSCOPY/URETEROSCOPY/HOLMIUM LASER/STENT PLACEMENT Right 12/22/2019   Procedure: CYSTOSCOPY/URETEROSCOPY/HOLMIUM LASER/STENT PLACEMENT;  Surgeon: Abbie Sons, MD;  Location: [facility];  Service: Urology;  Laterality: Right;   DIALYSIS/PERMA CATHETER INSERTION N/A 06/29/2021   Procedure: DIALYSIS/PERMA CATHETER INSERTION;  Surgeon: Algernon Huxley, MD;  Location: [facility]  INVASIVE [facility];  Service: Cardiovascular;   Laterality: N/A;   DIALYSIS/PERMA CATHETER INSERTION N/A 07/13/2021   Procedure: DIALYSIS/PERMA CATHETER INSERTION;  Surgeon: Algernon Huxley, MD;  Location: [place] [facility];  Service: Cardiovascular;  Laterality: N/A;   EYE SURGERY     IR GASTROSTOMY TUBE MOD SED  08/01/2021   LAPAROTOMY N/A 06/29/2021   Procedure: EXPLORATORY LAPAROTOMY WITH REPAIR OF DUODENAL PERFORATION;  Surgeon: [name], MD;  Location: [facility];  Service: General;  Laterality: N/A;   POLYPECTOMY  08/19/2015   Procedure: POLYPECTOMY;  Surgeon: Lucilla Lame, MD;  Location: [facility];  Service: Endoscopy;;   TUBAL LIGATION  1992    SOCIAL HISTORY: Social History   Socioeconomic History   Marital status: Married    Spouse name: Not on file   Number of children: Not on file   Years of education: Not on file   Highest education level: Not on file  Occupational History   Not on file  Tobacco Use   Smoking status: Former    Packs/day: 0.75    Years: 6.00    Pack years: 4.50    Types: Cigarettes    Quit date: 66    Years since quitting: 49.9   Smokeless tobacco: Never   Tobacco comments:    quit 1973  Vaping Use   Vaping Use: Never used  Substance and Sexual Activity   Alcohol use: No    Alcohol/week: 0.0 standard drinks   Drug use: No   Sexual activity: Not on file  Other Topics Concern   Not on file  Social History Narrative   Not on file   Social Determinants of Health   Financial Resource Strain: Not on file  Food Insecurity: Not on file  Transportation Needs: Not on file  Physical Activity: Not on file  Stress: Not on file  Social Connections: Not on file  Intimate Partner Violence: Not on file    FAMILY HISTORY: Family History  Problem Relation Age of Onset   Diabetes Father    Cancer Father        lung cancer   Diabetes Brother    Stroke Mother     ALLERGIES:  is allergic to contrast media  [iodinated diagnostic agents] and iodine.  MEDICATIONS:  Current  Outpatient Medications  Medication Sig Dispense Refill   acetaminophen (TYLENOL) 325 MG tablet Take 650 mg by mouth every 6 (six) hours as needed.     amLODipine (NORVASC) 10 MG tablet Place 1 tablet (10 mg total) into feeding tube daily.     Ascorbic Acid 500 MG CHEW 500 mg by Per G Tube route 1 (one) time each day     atorvastatin (LIPITOR) 20 MG tablet Take 1 tablet (20 mg total) by mouth daily. 90 tablet 3   buPROPion (WELLBUTRIN XL) 150 MG 24 hr tablet Place 1 tablet into feeding tube daily     busPIRone (BUSPAR) 5 MG tablet Take 5 mg by mouth 3 (three) times daily.     ciprofloxacin (CIPRO) 400 MG/200ML SOLN Inject 400 mg into the vein every 12 (twelve) hours.     cloNIDine (CATAPRES) 0.1 MG tablet Place 1 tablet (0.1 mg total) into feeding tube daily. 60 tablet 0   epoetin alfa (EPOGEN) 40000 UNIT/ML injection Inject 0.5 mLs (20,000 Units total) into the skin once a week. 1 mL    Exenatide ER (BYDUREON BCISE) 2 MG/0.85ML AUIJ      ferrous sulfate 220 (44 Fe) MG/5ML solution  220 mg by Per G Tube route 1 (one) time each day     fluticasone (FLONASE) 50 MCG/ACT nasal spray Place into both nostrils daily.     GUAIFENESIN PO Take by mouth.     heparin 5000 UNIT/ML injection Inject 1 mL (5,000 Units total) into the skin every 8 (eight) hours. 1 mL    insulin aspart (NOVOLOG) 100 UNIT/ML injection Inject 0-20 Units into the skin every 4 (four) hours. 10 mL 0   insulin detemir (LEVEMIR) 100 UNIT/ML injection Inject 0.3 mLs (30 Units total) into the skin 2 (two) times daily. 10 mL 11   insulin glargine (LANTUS) 100 UNIT/ML injection Inject 40 Units into the skin daily.     insulin regular (NOVOLIN R) 100 units/mL injection Inject into the skin 3 (three) times daily before meals. Sliding scale     lansoprazole (PREVACID) 30 MG capsule 30 mg Do not crush or chew. GI route     Lidocaine 4 % PTCH Apply topically.     loperamide (IMODIUM A-D) 2 MG tablet Take 2 mg by mouth 4 (four) times daily as  needed for diarrhea or loose stools.     metoprolol tartrate (LOPRESSOR) 100 MG tablet Place 1 tablet (100 mg total) into feeding tube 2 (two) times daily.     Miconazole Nitrate 2 % OINT Apply topically.     mirtazapine (REMERON) 15 MG tablet 15 mg by Per G Tube route     nutrition supplement, JUVEN, (JUVEN) PACK Place 1 packet into feeding tube 2 (two) times daily between meals.  0   Nutritional Supplements (FEEDING SUPPLEMENT, GLUCERNA 1.5 CAL,) LIQD Place 1,000 mLs into feeding tube continuous.     oxyCODONE-acetaminophen (PERCOCET/ROXICET) 5-325 MG tablet Take by mouth.     pantoprazole sodium (PROTONIX) 40 mg PACK Place 20 mLs (40 mg total) into feeding tube 2 (two) times daily. 30 packet    polyethylene glycol powder (GLYCOLAX/MIRALAX) 17 GM/SCOOP powder 17 g 1 (one) time each day Gi tube     senna-docusate (SENOKOT-S) 8.6-50 MG tablet 1 tablet by Per G Tube route 1 (one) time each day     sitaGLIPtin (JANUVIA) 25 MG tablet Take by mouth.     sodium zirconium cyclosilicate (LOKELMA) 10 g PACK packet Take by mouth.     torsemide 40 MG TABS Place 40 mg into feeding tube daily.     vancomycin IVPB Inject into the vein.     Water For Irrigation, Sterile (FREE WATER) SOLN Place 125 mLs into feeding tube every 4 (four) hours.     No current facility-administered medications for this visit.      [name]  PHYSICAL EXAMINATION:  Vitals:   11/07/21 1311  BP: (!) 113/56  Pulse: 73  Resp: 19  Temp: 98.7 F (37.1 C)  SpO2: 99%   There were no vitals filed for this visit.   Physical Exam Vitals and nursing note reviewed.  HENT:     Head: Normocephalic and atraumatic.     Mouth/Throat:     Pharynx: Oropharynx is clear.  Eyes:     Extraocular Movements: Extraocular movements intact.     Pupils: Pupils are equal, round, and reactive to light.  Cardiovascular:     Rate and Rhythm: Normal rate and regular rhythm.  Pulmonary:     Comments: Decreased breath sounds bilaterally.   Abdominal:     Palpations: Abdomen is soft.  Musculoskeletal:        General: Normal range of motion.  Cervical back: Normal range of motion.  Skin:    General: Skin is warm.  Neurological:     General: No focal deficit present.     Mental Status: She is alert and oriented to person, place, and time.  Psychiatric:        Behavior: Behavior normal.        Judgment: Judgment normal.     LABORATORY DATA:  I have reviewed the data as listed Lab Results  Component Value Date   WBC 10.0 11/07/2021   HGB 9.6 (L) 11/07/2021   HCT 31.1 (L) 11/07/2021   MCV 83.8 11/07/2021   PLT 284 11/07/2021   Recent Labs    07/15/21 1036 07/16/21 0444 07/17/21 0434 07/18/21 0511 07/21/21 0528 07/22/21 0516 07/28/21 0602 07/29/21 0615 07/31/21 0459 08/01/21 0411 08/02/21 0214 08/03/21 0310 10/10/21 1450 10/31/21 1425 11/07/21 1227  NA 145   < > 149*   < > 137   < > 144   < > 145  --  146*   < > 132* 136 136  K 4.0   < > 3.6   < > 2.9*   < > 3.5   < > 3.9   < > 4.1   < > 3.7 3.3* 2.8*  CL 104   < > 111   < > 99   < > 107   < > 108  --  106   < > 93* 99 101  CO2 28   < > 31   < > 28   < > 28   < > 26  --  28   < > '26 25 23  ' GLUCOSE 208*   < > 109*   < > 223*   < > 120*   < > 136*  --  242*   < > 132* 126* 110*  BUN 67*   < > 83*   < > 60*   < > 88*   < > 97*  --  95*   < > 36* 24* 19  CREATININE 1.97*   < > 2.02*   < > 2.35*   < > 2.26*   < > 2.01*  --  2.05*   < > 1.44* 1.53* 1.76*  CALCIUM 8.4*   < > 8.8*   < > 8.9   < > 9.1   < > 9.6  --  9.7   < > 9.8 9.4 9.2  GFRNONAA 26*   < > 25*   < > 21*   < > 22*   < > 26*  --  25*   < > 38* 35* 30*  PROT 5.8*   < > 6.0*  --  5.8*  --  6.7  --   --   --  6.9  --  8.1  --   --   ALBUMIN 1.7*   < > 1.8*  --  1.6*  --  2.0*  --  2.0*  --  2.4*  --  2.8*  --   --   AST 59*   < > 52*  --  33  --  19  --   --   --  25  --  16  --   --   ALT 139*   < > 88*  --  53*  --  31  --   --   --  33  --  16  --   --   ALKPHOS 124   < >  107  --  101  --   98  --   --   --  115  --  118  --   --   BILITOT 0.7   < > 0.5  --  0.5  --  0.7  --   --   --  0.7  --  0.2*  --   --   BILIDIR 0.1  --  <0.1  --  <0.1  --   --   --   --   --   --   --   --   --   --   IBILI 0.6  --  NOT CALCULATED  --  NOT CALCULATED  --   --   --   --   --   --   --   --   --   --    < > = values in this interval not displayed.     RADIOGRAPHIC STUDIES: I have personally reviewed the radiological images as listed and agreed with the findings in the report. No results found.  No problem-specific Assessment & Plan notes found for this encounter.  IDA- stable.  Hypokalemia-  CKD-   # Anemia likely secondary to CKD [see below]-September 2022 hemoglobin 8.5/GFR 25.  Recommend labs CBC CMP LDH iron studies ferritin kappa lambda light chain ratio multiple myeloma panel.    # Chronic kidney disease IV as per labs from September 2022-s/p Bil hydronephrolithiasis CKD [Dr.Kolluru] last HD sep 12th, 2022.   #Debility-recent prolonged hospitalization; improving/rehab [facility].     All questions were answered. The patient knows to call the clinic with any problems, questions or concerns.       Verlon Au, NP 11/07/2021 1:41 PM

## 2021-11-07 NOTE — Patient Instructions (Signed)
[facility] AT [place]  Discharge Instructions: Thank you for choosing [facility] to provide your oncology and hematology care.  If you have a lab appointment with the [facility], please go directly to the [facility] and check in at the registration area.  Wear comfortable clothing and clothing appropriate for easy access to any Portacath or PICC line.   We strive to give you quality time with your provider. You may need to reschedule your appointment if you arrive late (15 or more minutes).  Arriving late affects you and other patients whose appointments are after yours.  Also, if you miss three or more appointments without notifying the office, you may be dismissed from the clinic at the providers discretion.      For prescription refill requests, have your pharmacy contact our office and allow 72 hours for refills to be completed.    Today you received the following : Venofer   To help prevent nausea and vomiting after your treatment, we encourage you to take your nausea medication as directed.  BELOW ARE SYMPTOMS THAT SHOULD BE REPORTED IMMEDIATELY: *FEVER GREATER THAN 100.4 F (38 C) OR HIGHER *CHILLS OR SWEATING *NAUSEA AND VOMITING THAT IS NOT CONTROLLED WITH YOUR NAUSEA MEDICATION *UNUSUAL SHORTNESS OF BREATH *UNUSUAL BRUISING OR BLEEDING *URINARY PROBLEMS (pain or burning when urinating, or frequent urination) *BOWEL PROBLEMS (unusual diarrhea, constipation, pain near the anus) TENDERNESS IN MOUTH AND THROAT WITH OR WITHOUT PRESENCE OF ULCERS (sore throat, sores in mouth, or a toothache) UNUSUAL RASH, SWELLING OR PAIN  UNUSUAL VAGINAL DISCHARGE OR ITCHING   Items with * indicate a potential emergency and should be followed up as soon as possible or go to the Emergency Department if any problems should occur.  Please show the CHEMOTHERAPY ALERT CARD or IMMUNOTHERAPY ALERT CARD at check-in to the Emergency Department and triage  nurse.  Should you have questions after your visit or need to cancel or reschedule your appointment, please contact [facility] [place] AT [place]  [phone] and follow the prompts.  Office hours are 8:00 a.m. to 4:30 p.m. Monday - Friday. Please note that voicemails left after 4:00 p.m. may not be returned until the following business day.  We are closed weekends and major holidays. You have access to a nurse at all times for urgent questions. Please call the main number to the clinic [phone] and follow the prompts.  For any non-urgent questions, you may also contact your provider using MyChart. We now offer e-Visits for anyone 23 and older to request care online for non-urgent symptoms. For details visit [email or web address].   Also download the MyChart app! Go to the app store, search "MyChart", open the app, select [facility], and log in with your MyChart username and password.  Due to Covid, a mask is required upon entering the hospital/clinic. If you do not have a mask, one will be given to you upon arrival. For doctor visits, patients may have 1 support person aged 59 or older with them. For treatment visits, patients cannot have anyone with them due to current Covid guidelines and our immunocompromised population.

## 2021-11-07 NOTE — Progress Notes (Unsigned)
Please let Heather Lopez know that we do not need to replace the drain at this time as the fluid is minimal.  I have placed the referral to ID.

## 2021-11-08 NOTE — Progress Notes (Signed)
Called and notified patient's husband of results. He should expect a call from ID for apt follow up, after this our office will reach to schedule surgery for stone treatment. He states he will update patient's facility with this information

## 2021-11-14 ENCOUNTER — Inpatient Hospital Stay: Payer: Medicare Other

## 2021-11-21 ENCOUNTER — Inpatient Hospital Stay: Payer: Medicare Other

## 2021-11-21 ENCOUNTER — Other Ambulatory Visit: Payer: Self-pay

## 2021-11-21 ENCOUNTER — Encounter: Payer: Self-pay | Admitting: Internal Medicine

## 2021-11-21 ENCOUNTER — Other Ambulatory Visit: Payer: Self-pay | Admitting: *Deleted

## 2021-11-21 DIAGNOSIS — N184 Chronic kidney disease, stage 4 (severe): Secondary | ICD-10-CM | POA: Diagnosis not present

## 2021-11-21 DIAGNOSIS — D649 Anemia, unspecified: Secondary | ICD-10-CM

## 2021-11-21 LAB — HEMOGLOBIN AND HEMATOCRIT, BLOOD
HCT: 36.6 % (ref 36.0–46.0)
Hemoglobin: 10.9 g/dL — ABNORMAL LOW (ref 12.0–15.0)

## 2021-11-23 ENCOUNTER — Ambulatory Visit: Payer: Medicare Other

## 2021-11-23 ENCOUNTER — Encounter: Payer: Self-pay | Admitting: Internal Medicine

## 2021-11-24 ENCOUNTER — Encounter: Payer: Self-pay | Admitting: Internal Medicine

## 2021-11-27 ENCOUNTER — Encounter: Payer: Self-pay | Admitting: Internal Medicine

## 2021-11-28 ENCOUNTER — Telehealth: Payer: Self-pay | Admitting: Internal Medicine

## 2021-11-28 ENCOUNTER — Inpatient Hospital Stay: Payer: Medicare Other

## 2021-11-28 NOTE — Telephone Encounter (Signed)
done

## 2021-11-28 NOTE — Telephone Encounter (Signed)
Pt called to reschedule her appt. Call back at [phone]

## 2021-11-29 ENCOUNTER — Other Ambulatory Visit: Payer: Self-pay | Admitting: *Deleted

## 2021-11-29 DIAGNOSIS — D631 Anemia in chronic kidney disease: Secondary | ICD-10-CM

## 2021-11-29 DIAGNOSIS — D649 Anemia, unspecified: Secondary | ICD-10-CM

## 2021-11-29 DIAGNOSIS — N189 Chronic kidney disease, unspecified: Secondary | ICD-10-CM

## 2021-11-30 ENCOUNTER — Other Ambulatory Visit: Payer: Self-pay | Admitting: Urology

## 2021-11-30 ENCOUNTER — Encounter: Payer: Self-pay | Admitting: Internal Medicine

## 2021-11-30 NOTE — Progress Notes (Unsigned)
Surgical Physician Order Form [facility] [place]  * Scheduling expectation : Next Available  *Length of Case: 90 min  *Clearance needed: probably  *Anticoagulation Instructions: N/A  *Aspirin Instructions: N/A  *Post-op visit Date/Instructions:  1 week cysto stent removal  *Diagnosis: Left UPJ Stone; right hydronephrosis  *Procedure: left  Ureteroscopy w/laser lithotripsy & stent exchange (41712); right ureteral stent removal with retrograde pyelogram   Additional orders: N/A  -Admit type: OUTpatient  -Anesthesia: General  -VTE Prophylaxis Standing Order SCDs       Other:   -Standing Lab Orders Per Anesthesia    Lab other: UA&Urine Culture  -Standing Test orders EKG/Chest x-ray per Anesthesia       Test other:   - Medications:  Gentamicin per pharmacy  -Other orders:  N/A

## 2021-12-02 ENCOUNTER — Other Ambulatory Visit: Payer: Self-pay

## 2021-12-02 ENCOUNTER — Emergency Department: Payer: Medicare Other

## 2021-12-02 ENCOUNTER — Inpatient Hospital Stay
Admission: EM | Admit: 2021-12-02 | Discharge: 2021-12-12 | DRG: 659 | Disposition: A | Discharge status: Home Health | Payer: Medicare Other | Attending: Internal Medicine | Admitting: Internal Medicine

## 2021-12-02 DIAGNOSIS — I129 Hypertensive chronic kidney disease with stage 1 through stage 4 chronic kidney disease, or unspecified chronic kidney disease: Secondary | ICD-10-CM | POA: Diagnosis present

## 2021-12-02 DIAGNOSIS — G8929 Other chronic pain: Secondary | ICD-10-CM | POA: Diagnosis present

## 2021-12-02 DIAGNOSIS — E871 Hypo-osmolality and hyponatremia: Secondary | ICD-10-CM

## 2021-12-02 DIAGNOSIS — I700 Atherosclerosis of aorta: Secondary | ICD-10-CM | POA: Diagnosis present

## 2021-12-02 DIAGNOSIS — A4159 Other Gram-negative sepsis: Secondary | ICD-10-CM | POA: Diagnosis present

## 2021-12-02 DIAGNOSIS — M4628 Osteomyelitis of vertebra, sacral and sacrococcygeal region: Secondary | ICD-10-CM | POA: Diagnosis present

## 2021-12-02 DIAGNOSIS — Z6835 Body mass index (BMI) 35.0-35.9, adult: Secondary | ICD-10-CM

## 2021-12-02 DIAGNOSIS — G9341 Metabolic encephalopathy: Secondary | ICD-10-CM | POA: Diagnosis present

## 2021-12-02 DIAGNOSIS — K265 Chronic or unspecified duodenal ulcer with perforation: Secondary | ICD-10-CM | POA: Diagnosis present

## 2021-12-02 DIAGNOSIS — R112 Nausea with vomiting, unspecified: Secondary | ICD-10-CM

## 2021-12-02 DIAGNOSIS — Z91041 Radiographic dye allergy status: Secondary | ICD-10-CM

## 2021-12-02 DIAGNOSIS — E1169 Type 2 diabetes mellitus with other specified complication: Secondary | ICD-10-CM | POA: Diagnosis present

## 2021-12-02 DIAGNOSIS — E860 Dehydration: Secondary | ICD-10-CM | POA: Diagnosis present

## 2021-12-02 DIAGNOSIS — N189 Chronic kidney disease, unspecified: Secondary | ICD-10-CM

## 2021-12-02 DIAGNOSIS — Y846 Urinary catheterization as the cause of abnormal reaction of the patient, or of later complication, without mention of misadventure at the time of the procedure: Secondary | ICD-10-CM | POA: Diagnosis present

## 2021-12-02 DIAGNOSIS — N184 Chronic kidney disease, stage 4 (severe): Secondary | ICD-10-CM | POA: Diagnosis present

## 2021-12-02 DIAGNOSIS — Z79899 Other long term (current) drug therapy: Secondary | ICD-10-CM

## 2021-12-02 DIAGNOSIS — E1122 Type 2 diabetes mellitus with diabetic chronic kidney disease: Secondary | ICD-10-CM | POA: Diagnosis present

## 2021-12-02 DIAGNOSIS — M25551 Pain in right hip: Secondary | ICD-10-CM | POA: Diagnosis present

## 2021-12-02 DIAGNOSIS — A419 Sepsis, unspecified organism: Secondary | ICD-10-CM

## 2021-12-02 DIAGNOSIS — F419 Anxiety disorder, unspecified: Secondary | ICD-10-CM | POA: Diagnosis present

## 2021-12-02 DIAGNOSIS — E872 Acidosis, unspecified: Secondary | ICD-10-CM | POA: Diagnosis present

## 2021-12-02 DIAGNOSIS — Z452 Encounter for adjustment and management of vascular access device: Secondary | ICD-10-CM

## 2021-12-02 DIAGNOSIS — N390 Urinary tract infection, site not specified: Secondary | ICD-10-CM | POA: Diagnosis not present

## 2021-12-02 DIAGNOSIS — Z833 Family history of diabetes mellitus: Secondary | ICD-10-CM

## 2021-12-02 DIAGNOSIS — F32A Depression, unspecified: Secondary | ICD-10-CM | POA: Diagnosis present

## 2021-12-02 DIAGNOSIS — E1165 Type 2 diabetes mellitus with hyperglycemia: Secondary | ICD-10-CM | POA: Diagnosis not present

## 2021-12-02 DIAGNOSIS — N179 Acute kidney failure, unspecified: Secondary | ICD-10-CM | POA: Diagnosis present

## 2021-12-02 DIAGNOSIS — E669 Obesity, unspecified: Secondary | ICD-10-CM

## 2021-12-02 DIAGNOSIS — R6520 Severe sepsis without septic shock: Secondary | ICD-10-CM | POA: Diagnosis present

## 2021-12-02 DIAGNOSIS — L89611 Pressure ulcer of right heel, stage 1: Secondary | ICD-10-CM | POA: Diagnosis present

## 2021-12-02 DIAGNOSIS — N136 Pyonephrosis: Secondary | ICD-10-CM | POA: Diagnosis present

## 2021-12-02 DIAGNOSIS — L89154 Pressure ulcer of sacral region, stage 4: Secondary | ICD-10-CM | POA: Diagnosis present

## 2021-12-02 DIAGNOSIS — N200 Calculus of kidney: Secondary | ICD-10-CM | POA: Diagnosis not present

## 2021-12-02 DIAGNOSIS — D631 Anemia in chronic kidney disease: Secondary | ICD-10-CM | POA: Diagnosis present

## 2021-12-02 DIAGNOSIS — R1030 Lower abdominal pain, unspecified: Secondary | ICD-10-CM | POA: Diagnosis not present

## 2021-12-02 DIAGNOSIS — E782 Mixed hyperlipidemia: Secondary | ICD-10-CM | POA: Diagnosis present

## 2021-12-02 DIAGNOSIS — E875 Hyperkalemia: Secondary | ICD-10-CM

## 2021-12-02 DIAGNOSIS — R748 Abnormal levels of other serum enzymes: Secondary | ICD-10-CM | POA: Diagnosis not present

## 2021-12-02 DIAGNOSIS — Z66 Do not resuscitate: Secondary | ICD-10-CM | POA: Diagnosis present

## 2021-12-02 DIAGNOSIS — E8809 Other disorders of plasma-protein metabolism, not elsewhere classified: Secondary | ICD-10-CM

## 2021-12-02 DIAGNOSIS — T83518A Infection and inflammatory reaction due to other urinary catheter, initial encounter: Secondary | ICD-10-CM | POA: Diagnosis present

## 2021-12-02 DIAGNOSIS — R109 Unspecified abdominal pain: Secondary | ICD-10-CM | POA: Insufficient documentation

## 2021-12-02 DIAGNOSIS — N1330 Unspecified hydronephrosis: Secondary | ICD-10-CM

## 2021-12-02 DIAGNOSIS — Z20822 Contact with and (suspected) exposure to covid-19: Secondary | ICD-10-CM | POA: Diagnosis present

## 2021-12-02 DIAGNOSIS — R6521 Severe sepsis with septic shock: Secondary | ICD-10-CM

## 2021-12-02 DIAGNOSIS — E46 Unspecified protein-calorie malnutrition: Secondary | ICD-10-CM

## 2021-12-02 DIAGNOSIS — I10 Essential (primary) hypertension: Secondary | ICD-10-CM | POA: Diagnosis present

## 2021-12-02 DIAGNOSIS — Z87442 Personal history of urinary calculi: Secondary | ICD-10-CM

## 2021-12-02 DIAGNOSIS — Z87891 Personal history of nicotine dependence: Secondary | ICD-10-CM

## 2021-12-02 DIAGNOSIS — Z09 Encounter for follow-up examination after completed treatment for conditions other than malignant neoplasm: Secondary | ICD-10-CM

## 2021-12-02 DIAGNOSIS — L89302 Pressure ulcer of unspecified buttock, stage 2: Secondary | ICD-10-CM | POA: Diagnosis present

## 2021-12-02 DIAGNOSIS — E441 Mild protein-calorie malnutrition: Secondary | ICD-10-CM | POA: Diagnosis present

## 2021-12-02 DIAGNOSIS — I4892 Unspecified atrial flutter: Secondary | ICD-10-CM | POA: Diagnosis present

## 2021-12-02 DIAGNOSIS — L89159 Pressure ulcer of sacral region, unspecified stage: Secondary | ICD-10-CM

## 2021-12-02 DIAGNOSIS — Z794 Long term (current) use of insulin: Secondary | ICD-10-CM

## 2021-12-02 DIAGNOSIS — Z8711 Personal history of peptic ulcer disease: Secondary | ICD-10-CM

## 2021-12-02 DIAGNOSIS — M109 Gout, unspecified: Secondary | ICD-10-CM | POA: Diagnosis present

## 2021-12-02 HISTORY — DX: Nausea with vomiting, unspecified: R11.2

## 2021-12-02 LAB — URINALYSIS, ROUTINE W REFLEX MICROSCOPIC
Bilirubin Urine: NEGATIVE
Glucose, UA: NEGATIVE mg/dL
Ketones, ur: NEGATIVE mg/dL
Nitrite: NEGATIVE
Protein, ur: 100 mg/dL — AB
RBC / HPF: >50 RBC/hpf — ABNORMAL HIGH (ref 0–5)
Specific Gravity, Urine: 1.01 (ref 1.005–1.030)
Squamous Epithelial / HPF: NONE SEEN (ref 0–5)
WBC, UA: >50 WBC/hpf — ABNORMAL HIGH (ref 0–5)
pH: 5 (ref 5.0–8.0)

## 2021-12-02 LAB — CBC
HCT: 39.3 % (ref 36.0–46.0)
Hemoglobin: 12.3 g/dL (ref 12.0–15.0)
MCH: 26 pg (ref 26.0–34.0)
MCHC: 31.3 g/dL (ref 30.0–36.0)
MCV: 83.1 fL (ref 80.0–100.0)
Platelets: 314 10*3/uL (ref 150–400)
RBC: 4.73 MIL/uL (ref 3.87–5.11)
RDW: 16.8 % — ABNORMAL HIGH (ref 11.5–15.5)
WBC: 15.2 10*3/uL — ABNORMAL HIGH (ref 4.0–10.5)
nRBC: 0 % (ref 0.0–0.2)

## 2021-12-02 LAB — RESP PANEL BY RT-PCR (FLU A&B, COVID) ARPGX2
Influenza A by PCR: NEGATIVE
Influenza B by PCR: NEGATIVE
SARS Coronavirus 2 by RT PCR: NEGATIVE

## 2021-12-02 LAB — BASIC METABOLIC PANEL
Anion gap: 11 (ref 5–15)
BUN: 62 mg/dL — ABNORMAL HIGH (ref 8–23)
CO2: 16 mmol/L — ABNORMAL LOW (ref 22–32)
Calcium: 9.8 mg/dL (ref 8.9–10.3)
Chloride: 106 mmol/L (ref 98–111)
Creatinine, Ser: 2.37 mg/dL — ABNORMAL HIGH (ref 0.44–1.00)
GFR, Estimated: 21 mL/min — ABNORMAL LOW (ref >60)
Glucose, Bld: 217 mg/dL — ABNORMAL HIGH (ref 70–99)
Potassium: 6.3 mmol/L (ref 3.5–5.1)
Sodium: 133 mmol/L — ABNORMAL LOW (ref 135–145)

## 2021-12-02 LAB — COMPREHENSIVE METABOLIC PANEL
ALT: 9 U/L (ref 0–44)
AST: 13 U/L — ABNORMAL LOW (ref 15–41)
Albumin: 3.1 g/dL — ABNORMAL LOW (ref 3.5–5.0)
Alkaline Phosphatase: 158 U/L — ABNORMAL HIGH (ref 38–126)
Anion gap: 10 (ref 5–15)
BUN: 65 mg/dL — ABNORMAL HIGH (ref 8–23)
CO2: 14 mmol/L — ABNORMAL LOW (ref 22–32)
Calcium: 9.9 mg/dL (ref 8.9–10.3)
Chloride: 106 mmol/L (ref 98–111)
Creatinine, Ser: 2.48 mg/dL — ABNORMAL HIGH (ref 0.44–1.00)
GFR, Estimated: 20 mL/min — ABNORMAL LOW (ref >60)
Glucose, Bld: 198 mg/dL — ABNORMAL HIGH (ref 70–99)
Potassium: 6 mmol/L — ABNORMAL HIGH (ref 3.5–5.1)
Sodium: 130 mmol/L — ABNORMAL LOW (ref 135–145)
Total Bilirubin: 0.7 mg/dL (ref 0.3–1.2)
Total Protein: 7.6 g/dL (ref 6.5–8.1)

## 2021-12-02 LAB — HEMOGLOBIN A1C
Hgb A1c MFr Bld: 5.9 % — ABNORMAL HIGH (ref 4.8–5.6)
Mean Plasma Glucose: 122.63 mg/dL

## 2021-12-02 LAB — CBG MONITORING, ED
Glucose-Capillary: 197 mg/dL — ABNORMAL HIGH (ref 70–99)
Glucose-Capillary: 213 mg/dL — ABNORMAL HIGH (ref 70–99)
Glucose-Capillary: 149 mg/dL — ABNORMAL HIGH (ref 70–99)

## 2021-12-02 LAB — GLUCOSE, CAPILLARY: Glucose-Capillary: 155 mg/dL — ABNORMAL HIGH (ref 70–99)

## 2021-12-02 LAB — LACTIC ACID, PLASMA: Lactic Acid, Venous: 1.4 mmol/L (ref 0.5–1.9)

## 2021-12-02 LAB — PROCALCITONIN: Procalcitonin: 0.27 ng/mL

## 2021-12-02 LAB — POTASSIUM: Potassium: 5.1 mmol/L (ref 3.5–5.1)

## 2021-12-02 LAB — LIPASE, BLOOD: Lipase: 107 U/L — ABNORMAL HIGH (ref 11–51)

## 2021-12-02 MED ORDER — ONDANSETRON HCL 4 MG/2ML IJ SOLN
4.0000 mg | Freq: Four times a day (QID) | INTRAMUSCULAR | Status: DC | PRN
  Administered 2021-12-02 – 2021-12-03 (×2): 4 mg via INTRAVENOUS
  Filled 2021-12-02 (×2): qty 2

## 2021-12-02 MED ORDER — ONDANSETRON HCL 4 MG/2ML IJ SOLN
4.0000 mg | Freq: Once | INTRAMUSCULAR | Status: AC
Start: 2021-12-02 — End: 2021-12-02
  Administered 2021-12-02: 4 mg via INTRAVENOUS
  Filled 2021-12-02: qty 2

## 2021-12-02 MED ORDER — INSULIN ASPART 100 UNIT/ML IJ SOLN
0.0000 [IU] | Freq: Every day | INTRAMUSCULAR | Status: DC
  Administered 2021-12-08 – 2021-12-09 (×2): 5 [IU] via SUBCUTANEOUS
  Administered 2021-12-10: 4 [IU] via SUBCUTANEOUS
  Filled 2021-12-02 (×3): qty 1

## 2021-12-02 MED ORDER — VANCOMYCIN HCL 2000 MG/400ML IV SOLN
2000.0000 mg | Freq: Once | INTRAVENOUS | Status: AC
  Administered 2021-12-02: 2000 mg via INTRAVENOUS
  Filled 2021-12-02: qty 400

## 2021-12-02 MED ORDER — INSULIN ASPART 100 UNIT/ML IJ SOLN
0.0000 [IU] | Freq: Three times a day (TID) | INTRAMUSCULAR | Status: DC
  Administered 2021-12-02: 18:00:00 5 [IU] via SUBCUTANEOUS
  Administered 2021-12-02 – 2021-12-03 (×3): 3 [IU] via SUBCUTANEOUS
  Administered 2021-12-03 – 2021-12-05 (×3): 2 [IU] via SUBCUTANEOUS
  Administered 2021-12-05 (×2): 3 [IU] via SUBCUTANEOUS
  Administered 2021-12-06: 10:00:00 2 [IU] via SUBCUTANEOUS
  Administered 2021-12-07 (×2): 5 [IU] via SUBCUTANEOUS
  Administered 2021-12-09: 11 [IU] via SUBCUTANEOUS
  Administered 2021-12-09: 15 [IU] via SUBCUTANEOUS
  Administered 2021-12-10: 5 [IU] via SUBCUTANEOUS
  Administered 2021-12-10 (×2): 8 [IU] via SUBCUTANEOUS
  Administered 2021-12-11: 3 [IU] via SUBCUTANEOUS
  Administered 2021-12-11: 2 [IU] via SUBCUTANEOUS
  Administered 2021-12-12: 3 [IU] via SUBCUTANEOUS
  Filled 2021-12-02 (×20): qty 1

## 2021-12-02 MED ORDER — VANCOMYCIN VARIABLE DOSE PER UNSTABLE RENAL FUNCTION (PHARMACIST DOSING): Status: DC

## 2021-12-02 MED ORDER — SODIUM ZIRCONIUM CYCLOSILICATE 10 G PO PACK
10.0000 g | PACK | Freq: Once | ORAL | Status: AC
  Administered 2021-12-02: 10 g via ORAL
  Filled 2021-12-02: qty 1

## 2021-12-02 MED ORDER — CHLORHEXIDINE GLUCONATE CLOTH 2 % EX PADS
6.0000 | MEDICATED_PAD | Freq: Every day | CUTANEOUS | Status: DC
  Administered 2021-12-03 – 2021-12-10 (×8): 6 via TOPICAL

## 2021-12-02 MED ORDER — INSULIN ASPART 100 UNIT/ML IV SOLN
5.0000 [IU] | Freq: Once | INTRAVENOUS | Status: AC
  Filled 2021-12-02: qty 0.05

## 2021-12-02 MED ORDER — MORPHINE SULFATE (PF) 4 MG/ML IV SOLN
4.0000 mg | Freq: Once | INTRAVENOUS | Status: AC
  Administered 2021-12-08: 4 mg via INTRAVENOUS
  Filled 2021-12-02 (×2): qty 1

## 2021-12-02 MED ORDER — VANCOMYCIN HCL IN DEXTROSE 1-5 GM/200ML-% IV SOLN: 1000.0000 mg | Freq: Once | INTRAVENOUS | Status: DC

## 2021-12-02 MED ORDER — INSULIN ASPART 100 UNIT/ML IJ SOLN
INTRAMUSCULAR | Status: AC
  Administered 2021-12-02: 5 [IU] via INTRAVENOUS
  Filled 2021-12-02: qty 1

## 2021-12-02 MED ORDER — FLUCONAZOLE IN SODIUM CHLORIDE 400-0.9 MG/200ML-% IV SOLN
800.0000 mg | Freq: Once | INTRAVENOUS | Status: AC
  Administered 2021-12-02: 400 mg via INTRAVENOUS
  Filled 2021-12-02: qty 400

## 2021-12-02 MED ORDER — SODIUM CHLORIDE 0.9 % IV SOLN
2.0000 g | Freq: Every day | INTRAVENOUS | Status: DC
  Administered 2021-12-03: 2 g via INTRAVENOUS
  Filled 2021-12-02 (×2): qty 2

## 2021-12-02 MED ORDER — FENTANYL CITRATE PF 50 MCG/ML IJ SOSY
50.0000 ug | PREFILLED_SYRINGE | Freq: Once | INTRAMUSCULAR | Status: AC
  Administered 2021-12-02: 50 ug via INTRAVENOUS
  Filled 2021-12-02: qty 1

## 2021-12-02 MED ORDER — FLUCONAZOLE IN SODIUM CHLORIDE 400-0.9 MG/200ML-% IV SOLN
400.0000 mg | Freq: Every day | INTRAVENOUS | Status: DC
  Administered 2021-12-03: 400 mg via INTRAVENOUS
  Filled 2021-12-02: qty 200

## 2021-12-02 MED ORDER — PROCHLORPERAZINE EDISYLATE 10 MG/2ML IJ SOLN
10.0000 mg | Freq: Four times a day (QID) | INTRAMUSCULAR | Status: DC | PRN
  Administered 2021-12-02 – 2021-12-03 (×2): 10 mg via INTRAVENOUS
  Filled 2021-12-02 (×3): qty 2

## 2021-12-02 MED ORDER — OXYCODONE-ACETAMINOPHEN 5-325 MG PO TABS
1.0000 | ORAL_TABLET | ORAL | Status: DC | PRN
  Administered 2021-12-02: 1 via ORAL
  Filled 2021-12-02: qty 1

## 2021-12-02 MED ORDER — LACTATED RINGERS IV BOLUS: 1000.0000 mL | Freq: Once | INTRAVENOUS | Status: DC

## 2021-12-02 MED ORDER — SODIUM CHLORIDE 0.9 % IV BOLUS
500.0000 mL | Freq: Once | INTRAVENOUS | Status: AC
  Administered 2021-12-02: 500 mL via INTRAVENOUS

## 2021-12-02 MED ORDER — ONDANSETRON HCL 4 MG/2ML IJ SOLN
4.0000 mg | Freq: Once | INTRAMUSCULAR | Status: AC
  Administered 2021-12-02: 4 mg via INTRAVENOUS
  Filled 2021-12-02: qty 2

## 2021-12-02 MED ORDER — SODIUM CHLORIDE 0.9 % IV BOLUS
1000.0000 mL | Freq: Once | INTRAVENOUS | Status: AC
  Administered 2021-12-02: 1000 mL via INTRAVENOUS

## 2021-12-02 MED ORDER — INSULIN ASPART 100 UNIT/ML IJ SOLN
5.0000 [IU] | Freq: Once | INTRAMUSCULAR | Status: AC
  Administered 2021-12-02: 5 [IU] via INTRAVENOUS
  Filled 2021-12-02: qty 1

## 2021-12-02 MED ORDER — LACTATED RINGERS IV BOLUS
1000.0000 mL | Freq: Once | INTRAVENOUS | Status: AC
  Administered 2021-12-02: 1000 mL via INTRAVENOUS

## 2021-12-02 MED ORDER — INSULIN GLARGINE-YFGN 100 UNIT/ML ~~LOC~~ SOLN
10.0000 [IU] | Freq: Two times a day (BID) | SUBCUTANEOUS | Status: DC
  Administered 2021-12-02 – 2021-12-08 (×12): 10 [IU] via SUBCUTANEOUS
  Filled 2021-12-02 (×16): qty 0.1

## 2021-12-02 MED ORDER — DEXTROSE 50 % IV SOLN
1.0000 | Freq: Once | INTRAVENOUS | Status: AC
  Administered 2021-12-02: 50 mL via INTRAVENOUS
  Filled 2021-12-02: qty 50

## 2021-12-02 MED ORDER — HYDRALAZINE HCL 20 MG/ML IJ SOLN
10.0000 mg | Freq: Four times a day (QID) | INTRAMUSCULAR | Status: DC | PRN
  Administered 2021-12-02: 10 mg via INTRAVENOUS
  Filled 2021-12-02: qty 1

## 2021-12-02 MED ORDER — ATORVASTATIN CALCIUM 20 MG PO TABS
20.0000 mg | ORAL_TABLET | Freq: Every day | ORAL | Status: DC
  Administered 2021-12-02 – 2021-12-12 (×10): 20 mg via ORAL
  Filled 2021-12-02 (×10): qty 1

## 2021-12-02 MED ORDER — SODIUM CHLORIDE 0.9 % IV SOLN: INTRAVENOUS | Status: DC

## 2021-12-02 MED ORDER — SODIUM CHLORIDE 0.9 % IV SOLN
2.0000 g | Freq: Once | INTRAVENOUS | Status: AC
  Administered 2021-12-02: 2 g via INTRAVENOUS
  Filled 2021-12-02: qty 2

## 2021-12-02 MED ORDER — DEXTROSE 50 % IV SOLN
25.0000 mL | Freq: Once | INTRAVENOUS | Status: AC
  Administered 2021-12-02: 25 mL via INTRAVENOUS
  Filled 2021-12-02: qty 50

## 2021-12-02 MED ORDER — CALCIUM GLUCONATE-NACL 1-0.675 GM/50ML-% IV SOLN
1.0000 g | Freq: Once | INTRAVENOUS | Status: AC
  Administered 2021-12-02: 1000 mg via INTRAVENOUS
  Filled 2021-12-02: qty 50

## 2021-12-02 MED ORDER — INSULIN DETEMIR 100 UNIT/ML ~~LOC~~ SOLN
10.0000 [IU] | Freq: Two times a day (BID) | SUBCUTANEOUS | Status: DC
  Filled 2021-12-02 (×2): qty 0.1

## 2021-12-02 NOTE — ED Notes (Signed)
Lab called at this time to obtain blood work.

## 2021-12-02 NOTE — ED Provider Notes (Signed)
[facility] Provider Note    Event Date/Time   First MD Initiated Contact with Patient 12/02/21 0543     (approximate)   History   Pelvic Pain   HPI  Heather Lopez is a 76 y.o. female history of hypertension, diabetes, hyperlipidemia, obesity who presents to the emergency department with her husband from [facility] for complaints of several days of lower abdominal pain that significantly worsened tonight.  Husband reports that she had a temperature of 101 at her nursing facility.  She denies any vomiting or diarrhea but has had nausea.  She was admitted to the hospital on 06/27/2021 to 08/03/2021 for septic shock, duodenal ulcer perforation and pneumoperitoneum, acute renal failure.  Patient had bilateral hydronephrosis and had bilateral ureteral stents placed on 07/05/2021.  She also had a UTI and urine culture was positive for Candida glabrata and ID recommended high-dose Diflucan which was then changed to Amphotericin and Zosyn.  She also had a left perirenal abscess that had a drain placed on 08/01/2021.  It appears she was seen by urology and had a CT of her abdomen pelvis on 11/07/2021 which showed resolution of the left perirenal abscess and pigtail drainage catheter was removed.  At that time she had no hydronephrosis but bilateral ureteral stents were still noted.  Patient has a PICC line in the right upper extremity but states she is not currently receiving parenteral antibiotics.  Husband states she is on oral antibiotics but cannot recall the name.   History provided by patient and husband.      Past Medical History:  Diagnosis Date   Anxiety    Cataract    Depression    Diabetes mellitus without complication ([place])    Gout    History of kidney stones    Hyperlipidemia    Hypertension    Vertigo     Past Surgical History:  Procedure Laterality Date   CATARACT EXTRACTION, BILATERAL Bilateral 2019   COLONOSCOPY WITH PROPOFOL N/A 08/19/2015    Procedure: COLONOSCOPY WITH PROPOFOL;  Surgeon: Lucilla Lame, MD;  Location: [place];  Service: Endoscopy;  Laterality: N/A;  diabetic - insulin   CYSTOSCOPY W/ RETROGRADES Bilateral 12/22/2019   Procedure: CYSTOSCOPY WITH RETROGRADE PYELOGRAM;  Surgeon: Abbie Sons, MD;  Location: [facility];  Service: Urology;  Laterality: Bilateral;   CYSTOSCOPY W/ URETERAL STENT PLACEMENT Bilateral 07/05/2021   Procedure: CYSTOSCOPY WITH RETROGRADE PYELOGRAM/BILATERAL URETERAL STENT PLACEMENT;  Surgeon: Abbie Sons, MD;  Location: [facility];  Service: Urology;  Laterality: Bilateral;   CYSTOSCOPY WITH BIOPSY N/A 12/22/2019   Procedure: CYSTOSCOPY WITH bladder BIOPSY;  Surgeon: Abbie Sons, MD;  Location: [facility];  Service: Urology;  Laterality: N/A;   CYSTOSCOPY WITH STENT PLACEMENT Right 12/22/2019   Procedure: CYSTOSCOPY WITH STENT PLACEMENT;  Surgeon: Abbie Sons, MD;  Location: [facility];  Service: Urology;  Laterality: Right;   CYSTOSCOPY WITH URETEROSCOPY Bilateral 12/22/2019   Procedure: CYSTOSCOPY WITH URETEROSCOPY;  Surgeon: Abbie Sons, MD;  Location: [facility];  Service: Urology;  Laterality: Bilateral;   CYSTOSCOPY/URETEROSCOPY/HOLMIUM LASER/STENT PLACEMENT Right 12/22/2019   Procedure: CYSTOSCOPY/URETEROSCOPY/HOLMIUM LASER/STENT PLACEMENT;  Surgeon: Abbie Sons, MD;  Location: [facility];  Service: Urology;  Laterality: Right;   DIALYSIS/PERMA CATHETER INSERTION N/A 06/29/2021   Procedure: DIALYSIS/PERMA CATHETER INSERTION;  Surgeon: Algernon Huxley, MD;  Location: [place] [facility];  Service: Cardiovascular;  Laterality: N/A;   DIALYSIS/PERMA CATHETER INSERTION N/A 07/13/2021   Procedure: DIALYSIS/PERMA CATHETER INSERTION;  Surgeon: Algernon Huxley, MD;  Location: [place] [facility];  Service: Cardiovascular;  Laterality: N/A;   EYE SURGERY     IR GASTROSTOMY TUBE MOD SED  08/01/2021   LAPAROTOMY N/A 06/29/2021   Procedure: EXPLORATORY LAPAROTOMY WITH REPAIR OF DUODENAL  PERFORATION;  Surgeon: [name], MD;  Location: [facility];  Service: General;  Laterality: N/A;   POLYPECTOMY  08/19/2015   Procedure: POLYPECTOMY;  Surgeon: Lucilla Lame, MD;  Location: [place];  Service: Endoscopy;;   TUBAL LIGATION  1992    MEDICATIONS:  Prior to Admission medications   Medication Sig Start Date End Date Taking? Authorizing Provider  acetaminophen (TYLENOL) 325 MG tablet Take 650 mg by mouth every 6 (six) hours as needed.    [provider]  amLODipine (NORVASC) 10 MG tablet Place 1 tablet (10 mg total) into feeding tube daily. 08/04/21   Sharen Hones, MD  Ascorbic Acid 500 MG CHEW 500 mg by Per G Tube route 1 (one) time each day    [provider]  atorvastatin (LIPITOR) 20 MG tablet Take 1 tablet (20 mg total) by mouth daily. 04/15/18   Mikey College, NP  buPROPion (WELLBUTRIN XL) 150 MG 24 hr tablet Place 1 tablet into feeding tube daily 05/25/21   [provider]  busPIRone (BUSPAR) 5 MG tablet Take 5 mg by mouth 3 (three) times daily.    [provider]  ciprofloxacin (CIPRO) 400 MG/200ML SOLN Inject 400 mg into the vein every 12 (twelve) hours.    [provider]  cloNIDine (CATAPRES) 0.1 MG tablet Place 1 tablet (0.1 mg total) into feeding tube daily. 08/04/21   Sharen Hones, MD  epoetin alfa (EPOGEN) 40000 UNIT/ML injection Inject 0.5 mLs (20,000 Units total) into the skin once a week. 08/04/21   Sharen Hones, MD  Exenatide ER (BYDUREON BCISE) 2 MG/0.85ML AUIJ     [provider]  ferrous sulfate 220 (44 Fe) MG/5ML solution 220 mg by Per G Tube route 1 (one) time each day    [provider]  fluticasone (FLONASE) 50 MCG/ACT nasal spray Place into both nostrils daily.    [provider]  GUAIFENESIN PO Take by mouth.    [provider]  heparin 5000 UNIT/ML injection Inject 1 mL (5,000 Units total) into the skin every 8 (eight) hours. 08/03/21   Sharen Hones, MD  insulin  aspart (NOVOLOG) 100 UNIT/ML injection Inject 0-20 Units into the skin every 4 (four) hours. 08/03/21   Sharen Hones, MD  insulin detemir (LEVEMIR) 100 UNIT/ML injection Inject 0.3 mLs (30 Units total) into the skin 2 (two) times daily. 08/03/21   Sharen Hones, MD  insulin glargine (LANTUS) 100 UNIT/ML injection Inject 40 Units into the skin daily.    [provider]  insulin regular (NOVOLIN R) 100 units/mL injection Inject into the skin 3 (three) times daily before meals. Sliding scale    [provider]  lansoprazole (PREVACID) 30 MG capsule 30 mg Do not crush or chew. GI route    [provider]  Lidocaine 4 % PTCH Apply topically.    [provider]  loperamide (IMODIUM A-D) 2 MG tablet Take 2 mg by mouth 4 (four) times daily as needed for diarrhea or loose stools.    [provider]  metoprolol tartrate (LOPRESSOR) 100 MG tablet Place 1 tablet (100 mg total) into feeding tube 2 (two) times daily. 08/03/21   Sharen Hones, MD  Miconazole Nitrate 2 % OINT  Apply topically.    [provider]  mirtazapine (REMERON) 15 MG tablet 15 mg by Per G Tube route    [provider]  nutrition supplement, JUVEN, (JUVEN) PACK Place 1 packet into feeding tube 2 (two) times daily between meals. 08/03/21   Sharen Hones, MD  Nutritional Supplements (FEEDING SUPPLEMENT, GLUCERNA 1.5 CAL,) LIQD Place 1,000 mLs into feeding tube continuous. 08/03/21   Sharen Hones, MD  oxyCODONE-acetaminophen (PERCOCET/ROXICET) 5-325 MG tablet Take by mouth.    [provider]  pantoprazole sodium (PROTONIX) 40 mg PACK Place 20 mLs (40 mg total) into feeding tube 2 (two) times daily. 08/03/21   Sharen Hones, MD  polyethylene glycol powder (GLYCOLAX/MIRALAX) 17 GM/SCOOP powder 17 g 1 (one) time each day Gi tube    [provider]  potassium chloride SA (KLOR-CON M) 20 MEQ tablet Take 1 tablet (20 mEq total) by mouth 2 (two) times daily. 11/07/21   Verlon Au, NP   senna-docusate (SENOKOT-S) 8.6-50 MG tablet 1 tablet by Per G Tube route 1 (one) time each day    [provider]  sitaGLIPtin (JANUVIA) 25 MG tablet Take by mouth.    [provider]  sodium zirconium cyclosilicate (LOKELMA) 10 g PACK packet Take by mouth.    [provider]  torsemide 40 MG TABS Place 40 mg into feeding tube daily. 08/04/21   Sharen Hones, MD  vancomycin IVPB Inject into the vein.    [provider]  Water For Irrigation, Sterile (FREE WATER) SOLN Place 125 mLs into feeding tube every 4 (four) hours. 08/03/21   Sharen Hones, MD    Physical Exam   Triage Vital Signs: ED Triage Vitals [12/02/21 0402]  Enc Vitals Group     BP 112/82     Pulse Rate 79     Resp 18     Temp 99 F (37.2 C)     Temp Source Oral     SpO2 96 %     Weight 203 lb (92.1 kg)     Height 5\' 3"  (1.6 m)     Head Circumference      Peak Flow      Pain Score 10     Pain Loc      Pain Edu?      Excl. in [place]?     Most recent vital signs: Vitals:   12/02/21 0630 12/02/21 0700  BP:  (!) 118/95  Pulse:    Resp: 19 (!) 28  Temp:    SpO2:      CONSTITUTIONAL: Alert and oriented and responds appropriately to questions.  Obese.  Elderly.  Appears uncomfortable and moaning in pain. HEAD: Normocephalic, atraumatic EYES: Conjunctivae clear, pupils appear equal, sclera nonicteric ENT: normal nose; moist mucous membranes NECK: Supple, normal ROM CARD: RRR; S1 and S2 appreciated; no murmurs, no clicks, no rubs, no gallops RESP: Normal chest excursion without splinting or tachypnea; breath sounds clear and equal bilaterally; no wheezes, no rhonchi, no rales, no hypoxia or respiratory distress, speaking full sentences ABD/GI: Normal bowel sounds; non-distended; diffusely tender to palpation throughout the lower abdomen without guarding or rebound, indwelling Foley catheter in place with straw-colored urine in her Foley catheter bag BACK: She has a stage IV sacral  decubitus ulcer with packing in place with very minimal surrounding redness and no significant warmth, odor or drainage.  She is tender to palpation in this area.  Left pararenal drain is gone and there is no surrounding redness, warmth  or open wound to this area. EXT: Normal ROM in all joints; no deformity noted, no edema; no cyanosis, PICC line in the right upper extremity with no surrounding redness, warmth, drainage, bleeding or tenderness SKIN: Normal color for age and race; warm; no rash on exposed skin NEURO: Moves all extremities equally, normal speech PSYCH: The patient's mood and manner are appropriate.   ED Results / Procedures / Treatments   LABS: (all labs ordered are listed, but only abnormal results are displayed) Labs Reviewed  COMPREHENSIVE METABOLIC PANEL - Abnormal; Notable for the following components:      Result Value   Sodium 130 (*)    Potassium 6.0 (*)    CO2 14 (*)    Glucose, Bld 198 (*)    BUN 65 (*)    Creatinine, Ser 2.48 (*)    Albumin 3.1 (*)    AST 13 (*)    Alkaline Phosphatase 158 (*)    GFR, Estimated 20 (*)    All other components within normal limits  CBC - Abnormal; Notable for the following components:   WBC 15.2 (*)    RDW 16.8 (*)    All other components within normal limits  URINALYSIS, ROUTINE W REFLEX MICROSCOPIC - Abnormal; Notable for the following components:   Color, Urine YELLOW (*)    APPearance TURBID (*)    Hgb urine dipstick MODERATE (*)    Protein, ur 100 (*)    Leukocytes,Ua MODERATE (*)    RBC / HPF >50 (*)    WBC, UA >50 (*)    Bacteria, UA MANY (*)    All other components within normal limits  LIPASE, BLOOD - Abnormal; Notable for the following components:   Lipase 107 (*)    All other components within normal limits  CULTURE, BLOOD (ROUTINE X 2)  CULTURE, BLOOD (ROUTINE X 2)  URINE CULTURE  RESP PANEL BY RT-PCR (FLU A&B, COVID) ARPGX2  LACTIC ACID, PLASMA  PROCALCITONIN     EKG: pending  RADIOLOGY: My  personal review and interpretation of imaging: CT scan shows osteomyelitis of the coccyx and possible UTI.  I have personally reviewed all radiology reports.   CT ABDOMEN PELVIS WO CONTRAST  Result Date: 12/02/2021 CLINICAL DATA:  76 year old female with abdominal pain and fever. History of pararenal abscess status post CT-guided drainage in September. EXAM: CT ABDOMEN AND PELVIS WITHOUT CONTRAST TECHNIQUE: Multidetector CT imaging of the abdomen and pelvis was performed following the standard protocol without IV contrast. COMPARISON:  CT Abdomen 11/07/2021 and earlier. FINDINGS: Lower chest: Mild left lung base fibrothorax is stable. No pericardial or pleural effusion. Negative right lung base. Hepatobiliary: Distended gallbladder but no pericholecystic inflammation. Negative noncontrast liver. Pancreas: Negative. Spleen: Negative. Adrenals/Urinary Tract: Adrenal glands remain within normal limits. Enlarged bilateral renal collecting systems since last month with mild to moderate left and moderate to severe right hydronephrosis despite bilateral double-J ureteral stents still in place. And there is right hydroureter and periureteral stranding along the course of the right stent. Underlying bilateral nephrolithiasis is stable. Ureteral stent positioning appears adequate. And no calculus is identified along the course of either stent. However, the bladder is both distended and inflamed (series 2, image 71) with trace gas also within the bladder (same image). Perivesical inflammatory stranding is at least moderate (coronal image 49). Stomach/Bowel: Retained stool in the large bowel. Sigmoid diverticulosis. No convincing large bowel inflammation. Normal appendix visible on coronal image 54. Decompressed terminal ileum. No dilated small bowel. Fairly decompressed stomach  and duodenum. No free air. No free fluid. Vascular/Lymphatic: Aortoiliac calcified atherosclerosis. Normal caliber abdominal aorta. Vascular  patency is not evaluated in the absence of IV contrast. No lymphadenopathy identified. Reproductive: Stable and within normal limits. Other: Presacral inflammatory stranding is mild and stable to decreased since September. Musculoskeletal: Advanced degeneration in the spine. Very severe degeneration of the right hip. Since September there is a new sacral decubitus wound with soft tissue ulceration to the tip of the coccyx (series 2, image 87). And a subtle new sclerosis and cortical erosion of the dorsal coccyx on sagittal image 68 in keeping with coccygeal osteomyelitis. Soft tissue thickening in the region but no organized or drainable fluid collection. IMPRESSION: 1. Since September there is a new Sacral Decubitus Wound with Coccygeal Osteomyelitis. No organized or drainable fluid collection there. 2. New bilateral hydronephrosis and hydroureter, right > left, despite bilateral double-J ureteral stents with adequate positioning. And the urinary bladder is also Dilated And Inflamed, with trace gas in the suspicious for Gas-forming Urinary Infection unless explained by recent catheterization. Bilateral nephrolithiasis unchanged from last month. 3. No bowel inflammation or obstruction.  Normal appendix. 4. Aortic Atherosclerosis (ICD10-I70.0). Severe right hip degeneration. Electronically Signed   By: Genevie Kylea M.D.   On: 12/02/2021 06:25     PROCEDURES:  Critical Care performed: Yes, see critical care procedure note(s)   CRITICAL CARE Performed by: Cyril Mourning Weslyn Holsonback   Total critical care time: 65 minutes  Critical care time was exclusive of separately billable procedures and treating other patients.  Critical care was necessary to treat or prevent imminent or life-threatening deterioration.  Critical care was time spent personally by me on the following activities: development of treatment plan with patient and/or surrogate as well as nursing, discussions with consultants, evaluation of patient's  response to treatment, examination of patient, obtaining history from patient or surrogate, ordering and performing treatments and interventions, ordering and review of laboratory studies, ordering and review of radiographic studies, pulse oximetry and re-evaluation of patient's condition.   [name]1-3 Lead EKG Interpretation Performed by: Bubba Vanbenschoten, Delice Bison, DO Authorized by: Ezell Poke, Delice Bison, DO     Interpretation: normal     ECG rate:  79   ECG rate assessment: normal     Rhythm: sinus rhythm     Ectopy: none     Conduction: normal      IMPRESSION / MDM / ASSESSMENT AND PLAN / ED COURSE  I reviewed the triage vital signs and the nursing notes.    Patient here with worsening abdominal pain, fever, nausea.  Recently admitted to the hospital with septic shock with perforated duodenal ulcer and pneumoperitoneum, left perirenal abscess requiring perirenal drain which has been removed, bilateral hydronephrosis status post bilateral ureteral stents, Candida UTI, acute renal failure with creatinine greater than 9.  The patient is on the cardiac monitor to evaluate for evidence of arrhythmia and/or significant heart rate changes.   DIFFERENTIAL DIAGNOSIS (includes but not limited to):   Sepsis, bacteremia, UTI, kidney stone, pyelonephritis, bowel perforation, peritonitis, bowel obstruction, volvulus, appendicitis, colitis, diverticulitis   PLAN: Labs, urine, blood cultures, urine culture, CT of the abdomen pelvis, IV fluids, pain and nausea medicine.   MEDICATIONS GIVEN IN ED: Medications  oxyCODONE-acetaminophen (PERCOCET/ROXICET) 5-325 MG per tablet 1 tablet (1 tablet Oral Given 12/02/21 0411)  0.9 %  sodium chloride infusion (has no administration in time range)  morphine 4 MG/ML injection 4 mg (has no administration in time range)  ceFEPIme (MAXIPIME) 2 g in  sodium chloride 0.9 % 100 mL IVPB (has no administration in time range)  vancomycin (VANCOREADY) IVPB 2000 mg/400 mL (has no  administration in time range)  calcium gluconate 1 g/ 50 mL sodium chloride IVPB (has no administration in time range)  insulin aspart (novoLOG) injection 5 Units (has no administration in time range)  dextrose 50 % solution 25 mL (has no administration in time range)  lactated ringers bolus 1,000 mL (has no administration in time range)  fentaNYL (SUBLIMAZE) injection 50 mcg (50 mcg Intravenous Given 12/02/21 0639)  ondansetron (ZOFRAN) injection 4 mg (4 mg Intravenous Given 12/02/21 0639)  lactated ringers bolus 1,000 mL (1,000 mLs Intravenous New Bag/Given 12/02/21 6761)     ED COURSE: Patient's labs show leukocytosis of 15,000.  She is afebrile here but reports temperature of 101 at her nursing facility and was given medications for fever prior to arrival per her husband.  Lactic is 1.4 and procalcitonin is only minimally elevated at 0.27.  Her labs also show hyponatremia, hyperkalemia and a worsening renal function with creatinine of 2.48 with a new metabolic acidosis.  Suspect metabolic acidosis is due to uremia.  Patient is getting IV hydration.  Will place on cardiac monitoring for hyperkalemia and obtain EKG.  CT scan of the abdomen pelvis shows a new sacral decubitus wound with coccygeal osteomyelitis but no abscess.  On exam, she does have some redness around this area but no significant cellulitis.  She does have packing and a dressing over this area.  She also has new bilateral hydronephrosis and hydroureter right greater than left despite having double-J ureteral stents with adequate positioning and no sign of any obstruction on CT imaging.  The bladder is dilated, inflamed with trace gas that is suspicious for a gas-forming UTI versus recent catheterization.  She has an indwelling Foley catheter in place but states it has not been changed in a weeks.  Urinalysis and urine culture are currently pending.  Given patient meets sepsis criteria with fever, leukocytosis, possible UTI and a coccygeal  osteomyelitis, will start broad-spectrum antibiotics and continue IV fluids.  She has been hemodynamically stable.  Discussed findings with patient and husband and have recommended admission.  I reviewed all nursing notes and pertinent previous records as available.  I have reviewed and interpreted any EKGs, lab and urine results, imaging (as available).     CONSULTS: Patient will require admission to the hospital and likely consultation with urology given new hydronephrosis.  Hydronephrosis had resolved on last CT scan ordered by urology on 11/07/2021.  Patient may also need ID consultation given history of Candida glabrata UTI requiring high-dose Diflucan and then subsequent Amphotericin.  Patient signed out to oncoming ED physician who will consult hospitalist for admission once urinalysis has resulted.  EKG also pending at time of signout.   OUTSIDE RECORDS REVIEWED: Reviewed recent hospitalization notes August - September 2022 and urology note and imaging 11/07/21 - see above for more details.         FINAL CLINICAL IMPRESSION(S) / ED DIAGNOSES   Final diagnoses:  AKI (acute kidney injury) ([place])  Hyperkalemia  Sacral decubitus ulcer, stage IV ([facility])  Acute osteomyelitis of coccyx ([place])  Acute UTI     Rx / DC Orders   ED Discharge Orders     None        Note:  This document was prepared using Dragon voice recognition software and may include unintentional dictation errors.   Malania Gawthrop, Delice Bison, DO 12/02/21 [phone]

## 2021-12-02 NOTE — Consult Note (Addendum)
I have been asked to see the patient by Dr. Lindell Noe for evaluation and management of bilateral hydronephrosis also with sepsis.  History of present illness: 76 yo woman with history of right hydronephrosis, left UPJ calculus and prior bilateral ureteral stents placed by Dr. Bernardo Heater.  She comes back to ED with fever, possible osteomyelitis of sacral decubitus ulcer and was found to have bilateral hydronephrosis with distended bladder on CT scan.  Ureteral stents show to be in good position.  Patient also with acute on chronic kidney injury.  Patient is going to be admitted to medicine for antibiotics and further treatment.    Patient's husband reports patient was in severe pain which led them to the ED.  She states her pain was located in suprapubic region.  CT scan showed distended bladder concerning for malfunctioning Foley catheter.  Foley catheter was exchanged in the ED with purulent drainage.   Review of systems: A 12 point comprehensive review of systems was obtained and is negative unless otherwise stated in the history of present illness.  Patient Active Problem List   Diagnosis Date Noted   Anxiety 10/10/2021   Primary localized osteoarthritis of pelvic region and thigh 10/10/2021   Benign hypertensive kidney disease with chronic kidney disease 09/26/2021   Secondary hyperparathyroidism of renal origin ([place]) 09/26/2021   Hypokalemia 07/20/2021   Renal abscess    Essential hypertension    Lethargy    Duodenal ulcer perforation (HCC)    Demand ischemia of myocardium (HCC)    Atrial flutter with rapid ventricular response ([place]) 07/07/2021   Endotracheally intubated 07/07/2021   Bilateral nephrolithiasis 07/07/2021   Symptomatic anemia 07/07/2021   Pressure injury of skin 06/30/2021   Pneumoperitoneum    Acute respiratory failure with hypoxia ([place]) 06/27/2021   Severe sepsis with septic shock ([place]) 06/27/2021   Acute lower UTI 06/27/2021   Acute kidney injury superimposed on  chronic kidney disease ([place]) 06/27/2021   Type 2 diabetes mellitus with stage 4 chronic kidney disease, with long-term current use of insulin ([place]) 03/03/2018   Vitamin D deficiency 08/21/2016   IBS (irritable bowel syndrome) 08/21/2016   Special screening for malignant neoplasms, colon    Benign neoplasm of transverse colon    Benign neoplasm of sigmoid colon    Uncontrolled type 2 diabetes mellitus with insulin therapy ([place]) 07/12/2015   Benign hypertension with CKD (chronic kidney disease) stage III ([place]) 07/12/2015   Hyperlipidemia associated with type 2 diabetes mellitus ([place]) 07/12/2015   Depression 07/12/2015   BMI 40.0-44.9, adult ([place]) 07/12/2015    No current facility-administered medications on file prior to encounter.   Current Outpatient Medications on File Prior to Encounter  Medication Sig Dispense Refill   acetaminophen (TYLENOL) 325 MG tablet Take 650 mg by mouth every 6 (six) hours as needed.     amLODipine (NORVASC) 10 MG tablet Place 1 tablet (10 mg total) into feeding tube daily.     Ascorbic Acid 500 MG CHEW 500 mg by Per G Tube route 1 (one) time each day     atorvastatin (LIPITOR) 20 MG tablet Take 1 tablet (20 mg total) by mouth daily. 90 tablet 3   buPROPion (WELLBUTRIN XL) 150 MG 24 hr tablet Place 1 tablet into feeding tube daily     busPIRone (BUSPAR) 5 MG tablet Take 5 mg by mouth 3 (three) times daily.     ciprofloxacin (CIPRO) 400 MG/200ML SOLN Inject 400 mg into the vein every 12 (twelve) hours.  cloNIDine (CATAPRES) 0.1 MG tablet Place 1 tablet (0.1 mg total) into feeding tube daily. 60 tablet 0   epoetin alfa (EPOGEN) 40000 UNIT/ML injection Inject 0.5 mLs (20,000 Units total) into the skin once a week. 1 mL    Exenatide ER (BYDUREON BCISE) 2 MG/0.85ML AUIJ      ferrous sulfate 220 (44 Fe) MG/5ML solution 220 mg by Per G Tube route 1 (one) time each day     fluticasone (FLONASE) 50 MCG/ACT nasal spray Place into both nostrils daily.     GUAIFENESIN  PO Take by mouth.     heparin 5000 UNIT/ML injection Inject 1 mL (5,000 Units total) into the skin every 8 (eight) hours. 1 mL    insulin aspart (NOVOLOG) 100 UNIT/ML injection Inject 0-20 Units into the skin every 4 (four) hours. 10 mL 0   insulin detemir (LEVEMIR) 100 UNIT/ML injection Inject 0.3 mLs (30 Units total) into the skin 2 (two) times daily. 10 mL 11   insulin glargine (LANTUS) 100 UNIT/ML injection Inject 40 Units into the skin daily.     insulin regular (NOVOLIN R) 100 units/mL injection Inject into the skin 3 (three) times daily before meals. Sliding scale     lansoprazole (PREVACID) 30 MG capsule 30 mg Do not crush or chew. GI route     Lidocaine 4 % PTCH Apply topically.     loperamide (IMODIUM A-D) 2 MG tablet Take 2 mg by mouth 4 (four) times daily as needed for diarrhea or loose stools.     metoprolol tartrate (LOPRESSOR) 100 MG tablet Place 1 tablet (100 mg total) into feeding tube 2 (two) times daily.     Miconazole Nitrate 2 % OINT Apply topically.     mirtazapine (REMERON) 15 MG tablet 15 mg by Per G Tube route     nutrition supplement, JUVEN, (JUVEN) PACK Place 1 packet into feeding tube 2 (two) times daily between meals.  0   Nutritional Supplements (FEEDING SUPPLEMENT, GLUCERNA 1.5 CAL,) LIQD Place 1,000 mLs into feeding tube continuous.     oxyCODONE-acetaminophen (PERCOCET/ROXICET) 5-325 MG tablet Take by mouth.     pantoprazole sodium (PROTONIX) 40 mg PACK Place 20 mLs (40 mg total) into feeding tube 2 (two) times daily. 30 packet    polyethylene glycol powder (GLYCOLAX/MIRALAX) 17 GM/SCOOP powder 17 g 1 (one) time each day Gi tube     potassium chloride SA (KLOR-CON M) 20 MEQ tablet Take 1 tablet (20 mEq total) by mouth 2 (two) times daily. 60 tablet 0   senna-docusate (SENOKOT-S) 8.6-50 MG tablet 1 tablet by Per G Tube route 1 (one) time each day     sitaGLIPtin (JANUVIA) 25 MG tablet Take by mouth.     sodium zirconium cyclosilicate (LOKELMA) 10 g PACK packet Take  by mouth.     torsemide 40 MG TABS Place 40 mg into feeding tube daily.     vancomycin IVPB Inject into the vein.     Water For Irrigation, Sterile (FREE WATER) SOLN Place 125 mLs into feeding tube every 4 (four) hours.      Past Medical History:  Diagnosis Date   Anxiety    Cataract    Depression    Diabetes mellitus without complication ([place])    Gout    History of kidney stones    Hyperlipidemia    Hypertension    Vertigo     Past Surgical History:  Procedure Laterality Date   CATARACT EXTRACTION, BILATERAL Bilateral 2019   COLONOSCOPY  WITH PROPOFOL N/A 08/19/2015   Procedure: COLONOSCOPY WITH PROPOFOL;  Surgeon: Lucilla Lame, MD;  Location: [facility];  Service: Endoscopy;  Laterality: N/A;  diabetic - insulin   CYSTOSCOPY W/ RETROGRADES Bilateral 12/22/2019   Procedure: CYSTOSCOPY WITH RETROGRADE PYELOGRAM;  Surgeon: Abbie Sons, MD;  Location: [facility];  Service: Urology;  Laterality: Bilateral;   CYSTOSCOPY W/ URETERAL STENT PLACEMENT Bilateral 07/05/2021   Procedure: CYSTOSCOPY WITH RETROGRADE PYELOGRAM/BILATERAL URETERAL STENT PLACEMENT;  Surgeon: Abbie Sons, MD;  Location: [facility];  Service: Urology;  Laterality: Bilateral;   CYSTOSCOPY WITH BIOPSY N/A 12/22/2019   Procedure: CYSTOSCOPY WITH bladder BIOPSY;  Surgeon: Abbie Sons, MD;  Location: [facility];  Service: Urology;  Laterality: N/A;   CYSTOSCOPY WITH STENT PLACEMENT Right 12/22/2019   Procedure: CYSTOSCOPY WITH STENT PLACEMENT;  Surgeon: Abbie Sons, MD;  Location: [facility];  Service: Urology;  Laterality: Right;   CYSTOSCOPY WITH URETEROSCOPY Bilateral 12/22/2019   Procedure: CYSTOSCOPY WITH URETEROSCOPY;  Surgeon: Abbie Sons, MD;  Location: [facility];  Service: Urology;  Laterality: Bilateral;   CYSTOSCOPY/URETEROSCOPY/HOLMIUM LASER/STENT PLACEMENT Right 12/22/2019   Procedure: CYSTOSCOPY/URETEROSCOPY/HOLMIUM LASER/STENT PLACEMENT;  Surgeon: Abbie Sons, MD;  Location: [facility];   Service: Urology;  Laterality: Right;   DIALYSIS/PERMA CATHETER INSERTION N/A 06/29/2021   Procedure: DIALYSIS/PERMA CATHETER INSERTION;  Surgeon: Algernon Huxley, MD;  Location: [facility];  Service: Cardiovascular;  Laterality: N/A;   DIALYSIS/PERMA CATHETER INSERTION N/A 07/13/2021   Procedure: DIALYSIS/PERMA CATHETER INSERTION;  Surgeon: Algernon Huxley, MD;  Location: [facility];  Service: Cardiovascular;  Laterality: N/A;   EYE SURGERY     IR GASTROSTOMY TUBE MOD SED  08/01/2021   LAPAROTOMY N/A 06/29/2021   Procedure: EXPLORATORY LAPAROTOMY WITH REPAIR OF DUODENAL PERFORATION;  Surgeon: [name], MD;  Location: [facility];  Service: General;  Laterality: N/A;   POLYPECTOMY  08/19/2015   Procedure: POLYPECTOMY;  Surgeon: Lucilla Lame, MD;  Location: [facility];  Service: Endoscopy;;   TUBAL LIGATION  1992    Social History   Tobacco Use   Smoking status: Former    Packs/day: 0.75    Years: 6.00    Pack years: 4.50    Types: Cigarettes    Quit date: 1973    Years since quitting: 50.0   Smokeless tobacco: Never   Tobacco comments:    quit 1973  Vaping Use   Vaping Use: Never used  Substance Use Topics   Alcohol use: No    Alcohol/week: 0.0 standard drinks   Drug use: No    Family History  Problem Relation Age of Onset   Diabetes Father    Cancer Father        lung cancer   Diabetes Brother    Stroke Mother     PE: Vitals:   12/02/21 0402 12/02/21 0615 12/02/21 0630 12/02/21 0700  BP: 112/82   (!) 118/95  Pulse: 79     Resp: 18 19 19  (!) 28  Temp: 99 F (37.2 C)     TempSrc: Oral     SpO2: 96%     Weight: 92.1 kg     Height: 5\' 3"  (1.6 m)      Patient appears to be in no acute distress  patient is alert and oriented x3 Atraumatic normocephalic head No increased work of breathing, no audible wheezes/rhonchi Regular sinus rhythm/rate Abdomen is soft, nontender, nondistended, no CVA or suprapubic tenderness Grossly neurologically  intact No identifiable  skin lesions  Recent Labs    12/02/21 0408  WBC 15.2*  HGB 12.3  HCT 39.3   Recent Labs    12/02/21 0408  NA 130*  K 6.0*  CL 106  CO2 14*  GLUCOSE 198*  BUN 65*  CREATININE 2.48*  CALCIUM 9.9   No results for input(s): LABPT, INR in the last 72 hours. No results for input(s): LABURIN in the last 72 hours. Results for orders placed or performed during the hospital encounter of 06/27/21  Resp Panel by RT-PCR (Flu A&B, Covid) Nasopharyngeal Swab     Status: None   Collection Time: 06/27/21 12:32 AM   Specimen: Nasopharyngeal Swab; Nasopharyngeal(NP) swabs in vial transport medium  Result Value Ref Range Status   SARS Coronavirus 2 by RT PCR NEGATIVE NEGATIVE Final    Comment: (NOTE) SARS-CoV-2 target nucleic acids are NOT DETECTED.  The SARS-CoV-2 RNA is generally detectable in upper respiratory specimens during the acute phase of infection. The lowest concentration of SARS-CoV-2 viral copies this assay can detect is 138 copies/mL. A negative result does not preclude SARS-Cov-2 infection and should not be used as the sole basis for treatment or other patient management decisions. A negative result may occur with  improper specimen collection/handling, submission of specimen other than nasopharyngeal swab, presence of viral mutation(s) within the areas targeted by this assay, and inadequate number of viral copies(<138 copies/mL). A negative result must be combined with clinical observations, patient history, and epidemiological information. The expected result is Negative.  Fact Sheet for Patients:  EntrepreneurPulse.com.au  Fact Sheet for Healthcare Providers:  IncredibleEmployment.be  This test is no t yet approved or cleared by the [place] FDA and  has been authorized for detection and/or diagnosis of SARS-CoV-2 by FDA under an Emergency Use Authorization (EUA). This EUA will remain  in effect  (meaning this test can be used) for the duration of the COVID-19 declaration under Section 564(b)(1) of the Act, 21 U.S.C.section 360bbb-3(b)(1), unless the authorization is terminated  or revoked sooner.       Influenza A by PCR NEGATIVE NEGATIVE Final   Influenza B by PCR NEGATIVE NEGATIVE Final    Comment: (NOTE) The Xpert Xpress SARS-CoV-2/FLU/RSV plus assay is intended as an aid in the diagnosis of influenza from Nasopharyngeal swab specimens and should not be used as a sole basis for treatment. Nasal washings and aspirates are unacceptable for Xpert Xpress SARS-CoV-2/FLU/RSV testing.  Fact Sheet for Patients: EntrepreneurPulse.com.au  Fact Sheet for Healthcare Providers: IncredibleEmployment.be  This test is not yet approved or cleared by the [place] FDA and has been authorized for detection and/or diagnosis of SARS-CoV-2 by FDA under an Emergency Use Authorization (EUA). This EUA will remain in effect (meaning this test can be used) for the duration of the COVID-19 declaration under Section 564(b)(1) of the Act, 21 U.S.C. section 360bbb-3(b)(1), unless the authorization is terminated or revoked.  Performed at [facility], [address] [place], [place] [address]   Blood culture (routine x 2)     Status: None   Collection Time: 06/27/21 12:43 AM   Specimen: BLOOD  Result Value Ref Range Status   Specimen Description BLOOD RIGHT ASSIST CONTROL  Final   Special Requests   Final    BOTTLES DRAWN AEROBIC AND ANAEROBIC Blood Culture results may not be optimal due to an inadequate volume of blood received in culture bottles   Culture   Final    NO GROWTH 5 DAYS Performed at [facility], [place], [place] [address]    Report Status 07/02/2021 FINAL  Final  Blood culture (routine x 2)     Status: None   Collection Time: 06/27/21  1:45 AM   Specimen: BLOOD  Result Value Ref Range Status    Specimen Description BLOOD RIGHT ASSIST CONTROL  Final   Special Requests   Final    BOTTLES DRAWN AEROBIC AND ANAEROBIC Blood Culture adequate volume   Culture   Final    NO GROWTH 5 DAYS Performed at [facility], [address], [place], [place] [address]    Report Status 07/02/2021 FINAL  Final  Urine Culture     Status: Abnormal   Collection Time: 06/27/21  2:39 AM   Specimen: In/Out Cath Urine  Result Value Ref Range Status   Specimen Description   Final    IN/OUT CATH URINE Performed at [facility], [address], [place], [place] [address]    Special Requests   Final    NONE Performed at [facility], [address] [place], [place] [address]    Culture 30,000 COLONIES/mL YEAST (A)  Final   Report Status 06/28/2021 FINAL  Final  MRSA Next Gen by PCR, Nasal     Status: None   Collection Time: 06/30/21  5:01 AM   Specimen: Nasal Mucosa; Nasal Swab  Result Value Ref Range Status   MRSA by PCR Next Gen NOT DETECTED NOT DETECTED Final    Comment: (NOTE) The GeneXpert MRSA Assay (FDA approved for NASAL specimens only), is one component of a comprehensive MRSA colonization surveillance program. It is not intended to diagnose MRSA infection nor to guide or monitor treatment for MRSA infections. Test performance is not FDA approved in patients less than 53 years old. Performed at [facility], [address] [place], [place] [address]   Urine Culture     Status: Abnormal   Collection Time: 07/05/21 10:36 AM   Specimen: PATH Other; Urine  Result Value Ref Range Status   Specimen Description   Final    URINE, RANDOM LEFT RENAL PELVIS URINE Performed at [facility], [address], [place], [place] [address]    Special Requests   Final    NONE Performed at [facility], [address] [place], [place] [address]    Culture 70,000 [place] (A)  Final   Report Status 07/12/2021 FINAL  Final   Urine Culture     Status: Abnormal   Collection Time: 07/05/21 10:48 AM   Specimen: PATH Other; Urine  Result Value Ref Range Status   Specimen Description   Final    URINE, RANDOM RIGHT KIDNEY URINE Performed at [facility], [address] [place], [place] [address]    Special Requests   Final    NONE Performed at [facility], [address] [place], [place] [address]    Culture (A)  Final    >=100,000 COLONIES/mL CANDIDA GLABRATA SEE SEPARATE REPORT Performed at [facility], [place] [address], [place], [place] [address]    Report Status 07/17/2021 FINAL  Final  Antifungal AST 9 Drug Panel     Status: None   Collection Time: 07/05/21 10:48 AM  Result Value Ref Range Status   Organism ID, Yeast Candida glabrata  Corrected    Comment: (NOTE) Identification performed by account, not confirmed by this laboratory. CORRECTED ON 08/21 AT 1535: PREVIOUSLY REPORTED AS Preliminary report  Amphotericin B MIC 0.5 ug/mL  Final    Comment: (NOTE) Breakpoints have been established for only some organism-drug combinations as indicated. This test was developed and its performance characteristics determined by [facility]. It has not been cleared or approved by the Food and Drug Administration.    Anidulafungin MIC Comment  Final    Comment: (NOTE) 0.03 ug/mL Susceptible Breakpoints have been established for only some organism-drug combinations as indicated. This test was developed and its performance characteristics determined by [facility]. It has not been cleared or approved by the Food and Drug Administration.    Caspofungin MIC Comment  Final    Comment: (NOTE) 0.06 ug/mL Susceptible Breakpoints have been established for only some organism-drug combinations as indicated. This test was developed and its performance characteristics determined by [facility]. It has not been cleared or approved by the Food and Drug Administration.    Micafungin MIC Comment   Final    Comment: (NOTE) 0.016 ug/mL Susceptible Breakpoints have been established for only some organism-drug combinations as indicated. This test was developed and its performance characteristics determined by [facility]. It has not been cleared or approved by the Food and Drug Administration.    Posaconazole MIC 0.25 ug/mL  Final    Comment: (NOTE) Breakpoints have been established for only some organism-drug combinations as indicated. This test was developed and its performance characteristics determined by [facility]. It has not been cleared or approved by the Food and Drug Administration.    Fluconazole Islt MIC 2.0 ug/mL  Final    Comment: (NOTE) Susceptible Dose Dependent Breakpoints have been established for only some organism-drug combinations as indicated. This test was developed and its performance characteristics determined by [facility]. It has not been cleared or approved by the Food and Drug Administration.    Flucytosine MIC 0.06 ug/mL or less  Final    Comment: (NOTE) Breakpoints have been established for only some organism-drug combinations as indicated. This test was developed and its performance characteristics determined by [facility]. It has not been cleared or approved by the Food and Drug Administration.    Itraconazole MIC 0.12 ug/mL  Final    Comment: (NOTE) Breakpoints have been established for only some organism-drug combinations as indicated. This test was developed and its performance characteristics determined by [facility]. It has not been cleared or approved by the Food and Drug Administration.    Voriconazole MIC 0.06 ug/mL  Final    Comment: (NOTE) Breakpoints have been established for only some organism-drug combinations as indicated. This test was developed and its performance characteristics determined by [facility]. It has not been cleared or approved by the Food and Drug Administration. Performed At: [facility] [address]  [place], [place] [id number] Rush Farmer MD [id number]    Source CANDIDA GLABRATA SUSCEPTIBILITY URINE CULTURE  Final    Comment: Performed at [facility], [place] [address] [place], [place] [address]  CULTURE, BLOOD (ROUTINE X 2) w Reflex to ID Panel     Status: None   Collection Time: 07/15/21  7:57 PM   Specimen: BLOOD  Result Value Ref Range Status   Specimen Description BLOOD LEFT ASSIST CONTROL  Final   Special Requests   Final    BOTTLES DRAWN AEROBIC AND ANAEROBIC Blood Culture adequate volume   Culture   Final    NO GROWTH 5 DAYS Performed at [facility], [address] [place], [place] [address]    Report Status 07/20/2021 FINAL  Final  CULTURE, BLOOD (ROUTINE X 2)  w Reflex to ID Panel     Status: None   Collection Time: 07/15/21  8:07 PM   Specimen: BLOOD  Result Value Ref Range Status   Specimen Description BLOOD LEFT FOREARM  Final   Special Requests   Final    BOTTLES DRAWN AEROBIC AND ANAEROBIC Blood Culture adequate volume   Culture   Final    NO GROWTH 5 DAYS Performed at [facility], [address] [place], [place] [address]    Report Status 07/20/2021 FINAL  Final  Body fluid culture w Gram Stain     Status: None   Collection Time: 07/21/21  1:23 PM   Specimen: Peritoneal Washings; Body Fluid  Result Value Ref Range Status   Specimen Description   Final    PERITONEAL Performed at [facility], [address] [place], [place] [address]    Special Requests   Final    NONE Performed at [facility], [place], [place] [address]    Gram Stain   Final    MODERATE WBC PRESENT,BOTH PMN AND MONONUCLEAR NO ORGANISMS SEEN    Culture   Final    RARE CANDIDA GLABRATA Performed at [facility] SEE RESULTS IN SEPERATE REPORT Performed at [facility], [place] [address] [place], [place] [address]    Report Status 07/31/2021 FINAL  Final  Antifungal AST 9 Drug Panel     Status: None    Collection Time: 07/21/21  1:23 PM  Result Value Ref Range Status   Organism ID, Yeast Candida glabrata  Corrected    Comment: (NOTE) Identification performed by account, not confirmed by this laboratory. CORRECTED ON 09/04 AT 1435: PREVIOUSLY REPORTED AS Preliminary report    Amphotericin B MIC 0.5 ug/mL  Final    Comment: (NOTE) Breakpoints have been established for only some organism-drug combinations as indicated. This test was developed and its performance characteristics determined by [facility]. It has not been cleared or approved by the Food and Drug Administration.    Anidulafungin MIC Comment  Final    Comment: (NOTE) 0.03 ug/mL Susceptible Breakpoints have been established for only some organism-drug combinations as indicated. This test was developed and its performance characteristics determined by [facility]. It has not been cleared or approved by the Food and Drug Administration.    Caspofungin MIC Comment  Final    Comment: (NOTE) 0.06 ug/mL Susceptible Breakpoints have been established for only some organism-drug combinations as indicated. This test was developed and its performance characteristics determined by [facility]. It has not been cleared or approved by the Food and Drug Administration.    Micafungin MIC Comment  Final    Comment: (NOTE) 0.016 ug/mL Susceptible Breakpoints have been established for only some organism-drug combinations as indicated. This test was developed and its performance characteristics determined by [facility]. It has not been cleared or approved by the Food and Drug Administration.    Posaconazole MIC 0.25 ug/mL  Final    Comment: (NOTE) Breakpoints have been established for only some organism-drug combinations as indicated. This test was developed and its performance characteristics determined by [facility]. It has not been cleared or approved by the Food and Drug Administration.    Fluconazole Islt MIC 4.0 ug/mL  Final    Comment:  (NOTE) Susceptible Dose Dependent Breakpoints have been established for only some organism-drug combinations as indicated. This test was developed and its performance characteristics determined by [facility]. It has not been cleared or approved by the Food and  Drug Administration.    Flucytosine MIC 0.06 ug/mL or less  Final    Comment: (NOTE) Breakpoints have been established for only some organism-drug combinations as indicated. This test was developed and its performance characteristics determined by [facility]. It has not been cleared or approved by the Food and Drug Administration.    Itraconazole MIC 0.25 ug/mL  Final    Comment: (NOTE) Breakpoints have been established for only some organism-drug combinations as indicated. This test was developed and its performance characteristics determined by [facility]. It has not been cleared or approved by the Food and Drug Administration.    Voriconazole MIC 0.06 ug/mL  Final    Comment: (NOTE) Breakpoints have been established for only some organism-drug combinations as indicated. This test was developed and its performance characteristics determined by [facility]. It has not been cleared or approved by the Food and Drug Administration. Performed At: [facility] [place], [place] [id number] Rush Farmer MD QV:[id number]    Source CANDIDA GLABRATA/ PERITONEAL FLUID  Final    Comment: Performed at [facility], [place] [address] [place], [place] [address]  Aerobic/Anaerobic Culture w Gram Stain (surgical/deep wound)     Status: None   Collection Time: 07/21/21  3:32 PM   Specimen: Abdomen  Result Value Ref Range Status   Specimen Description   Final    ABDOMEN Performed at [facility], [address], [place], [place] [address]    Special Requests   Final    NONE Performed at [facility], [address] [place], [place] [address]    Gram Stain   Final    MODERATE SQUAMOUS  EPITHELIAL CELLS PRESENT WBC PRESENT, PREDOMINANTLY PMN NO ORGANISMS SEEN    Culture   Final    RARE CANDIDA GLABRATA NO ANAEROBES ISOLATED Performed at [facility], [place] [address] [place], [place] [address]    Report Status 07/26/2021 FINAL  Final  CULTURE, BLOOD (ROUTINE X 2) w Reflex to ID Panel     Status: Abnormal   Collection Time: 07/27/21  4:55 PM   Specimen: BLOOD  Result Value Ref Range Status   Specimen Description   Final    BLOOD BLOOD LEFT HAND Performed at [facility], [address], [place], [place] [address]    Special Requests   Final    BOTTLES DRAWN AEROBIC AND ANAEROBIC Blood Culture results may not be optimal due to an inadequate volume of blood received in culture bottles Performed at [facility], [address] [place], [place] [address]    Culture  Setup Time   Final    Organism ID to follow [place] CRITICAL RESULT CALLED TO, READ BACK BY AND VERIFIED WITH: [place] Performed at [facility], [address] [place],  [address]    Culture (A)  Final    STAPHYLOCOCCUS EPIDERMIDIS THE SIGNIFICANCE OF ISOLATING THIS ORGANISM FROM A SINGLE SET OF BLOOD CULTURES WHEN MULTIPLE SETS ARE DRAWN IS UNCERTAIN. PLEASE NOTIFY THE MICROBIOLOGY DEPARTMENT WITHIN ONE WEEK IF SPECIATION AND SENSITIVITIES ARE REQUIRED. Performed at [facility], [place] [address] [place],  [address]    Report Status 07/31/2021 FINAL  Final  Blood Culture ID Panel (Reflexed)     Status: Abnormal   Collection Time: 07/27/21  4:55 PM  Result Value Ref Range Status   Enterococcus faecalis NOT DETECTED NOT DETECTED Final   Enterococcus Faecium NOT  DETECTED NOT DETECTED Final   Listeria monocytogenes NOT DETECTED NOT DETECTED Final   Staphylococcus species DETECTED (A) NOT DETECTED Final    Comment: CRITICAL RESULT CALLED TO, READ BACK BY AND VERIFIED WITH: SUZANE WATSON AT 1940  ON 07/28/21 BY SS    Staphylococcus aureus (BCID) NOT DETECTED NOT DETECTED Final   Staphylococcus epidermidis DETECTED (A) NOT DETECTED Final    Comment: Methicillin (oxacillin) resistant coagulase negative staphylococcus. Possible blood culture contaminant (unless isolated from more than one blood culture draw or clinical case suggests pathogenicity). No antibiotic treatment is indicated for blood  culture contaminants. CRITICAL RESULT CALLED TO, READ BACK BY AND VERIFIED WITH: [place] ON 07/28/21 BY SS    Staphylococcus lugdunensis NOT DETECTED NOT DETECTED Final   Streptococcus species NOT DETECTED NOT DETECTED Final   Streptococcus agalactiae NOT DETECTED NOT DETECTED Final   Streptococcus pneumoniae NOT DETECTED NOT DETECTED Final   Streptococcus pyogenes NOT DETECTED NOT DETECTED Final   A.calcoaceticus-baumannii NOT DETECTED NOT DETECTED Final   Bacteroides fragilis NOT DETECTED NOT DETECTED Final   Enterobacterales NOT DETECTED NOT DETECTED Final   Enterobacter cloacae complex NOT DETECTED NOT DETECTED Final   Escherichia coli NOT DETECTED NOT DETECTED Final   Klebsiella aerogenes NOT DETECTED NOT DETECTED Final   Klebsiella oxytoca NOT DETECTED NOT DETECTED Final   Klebsiella pneumoniae NOT DETECTED NOT DETECTED Final   Proteus species NOT DETECTED NOT DETECTED Final   Salmonella species NOT DETECTED NOT DETECTED Final   Serratia marcescens NOT DETECTED NOT DETECTED Final   Haemophilus influenzae NOT DETECTED NOT DETECTED Final   Neisseria meningitidis NOT DETECTED NOT DETECTED Final   Pseudomonas aeruginosa NOT DETECTED NOT DETECTED Final   Stenotrophomonas maltophilia NOT DETECTED NOT DETECTED Final   Candida albicans NOT DETECTED NOT DETECTED Final   Candida auris NOT DETECTED NOT DETECTED Final   Candida glabrata NOT DETECTED NOT DETECTED Final   Candida krusei NOT DETECTED NOT DETECTED Final   Candida parapsilosis NOT DETECTED NOT DETECTED Final   Candida  tropicalis NOT DETECTED NOT DETECTED Final   Cryptococcus neoformans/gattii NOT DETECTED NOT DETECTED Final   Methicillin resistance mecA/C DETECTED (A) NOT DETECTED Final    Comment: CRITICAL RESULT CALLED TO, READ BACK BY AND VERIFIED WITH: [place] ON 07/28/21 BY SS Performed at [facility], [address] [place], [place] [address]   CULTURE, BLOOD (ROUTINE X 2) w Reflex to ID Panel     Status: None   Collection Time: 07/27/21  4:56 PM   Specimen: BLOOD  Result Value Ref Range Status   Specimen Description BLOOD BLOOD RIGHT HAND  Final   Special Requests   Final    BOTTLES DRAWN AEROBIC AND ANAEROBIC Blood Culture adequate volume   Culture   Final    NO GROWTH 5 DAYS Performed at [facility], [address] [place], [place] [address]    Report Status 08/01/2021 FINAL  Final  CULTURE, BLOOD (ROUTINE X 2) w Reflex to ID Panel     Status: None   Collection Time: 07/28/21  8:59 PM   Specimen: BLOOD  Result Value Ref Range Status   Specimen Description   Final    BLOOD BLOOD RIGHT HAND Performed at [facility], [address] [place], [place] [address]    Special Requests   Final    BOTTLES DRAWN AEROBIC AND ANAEROBIC Blood Culture results may not be optimal due to an inadequate volume of blood received  in culture bottles Performed at [facility], [place], [place] [address]    Culture   Final    TEST CANCELLED PER MD Dr. Delaine Lame TEST WILL BE CREDITED Performed at [facility], [place] [address], [place], [place] [address]    Report Status 08/07/2021 FINAL  Final  Aerobic/Anaerobic Culture w Gram Stain (surgical/deep wound)     Status: None   Collection Time: 08/01/21  1:12 PM   Specimen: Abscess  Result Value Ref Range Status   Specimen Description   Final    ABSCESS Performed at [facility], [address] [place], [place] [address]    Special Requests   Final    LEFT PERI RENAL  ABSCESS Performed at [facility], [address] [place], [place] [address]    Gram Stain   Final    ABUNDANT WBC PRESENT,BOTH PMN AND MONONUCLEAR NO ORGANISMS SEEN    Culture   Final    No growth aerobically or anaerobically. Performed at [facility], [place] [address], [place],  [address]    Report Status 08/06/2021 FINAL  Final    Imaging: CT Abd/Pelvis 12/02/21 IMPRESSION: 1. Since September there is a new Sacral Decubitus Wound with Coccygeal Osteomyelitis. No organized or drainable fluid collection there.   2. New bilateral hydronephrosis and hydroureter, right > left, despite bilateral double-J ureteral stents with adequate positioning. And the urinary bladder is also Dilated And Inflamed, with trace gas in the suspicious for Gas-forming Urinary Infection unless explained by recent catheterization. Bilateral nephrolithiasis unchanged from last month.   3. No bowel inflammation or obstruction.  Normal appendix.   4. Aortic Atherosclerosis (ICD10-I70.0). Severe right hip degeneration.     Electronically Signed   By: Genevie Starla M.D.   On: 12/02/2021 06:25  Impression/Recommendations: Bilateral hydronephrosis with AKI: -ureteral stents are in appropriate position however bladder is distended despite having foley catheter in place -recommend exchanging foley as it is likely not draining which is causing bilateral hydronephrosis and AKI -If Foley catheter becomes obstructed due to purulent drainage/debris would recommend upsizing catheter to 18/20 [place] -We will follow patient's renal function however suspect this will improve after urinary tract has been decompressed; can also follow up with repeat renal US in 2-3 days to confirm resolution of hydronephrosis -Ureteroscopy to be scheduled after patient has been treated for urosepsis at a later date  2. Complicated UTI: -Please continue broad-spectrum IV antibiotics until urine culture sensitivities  return   Linnet Bottari D Laron Boorman

## 2021-12-02 NOTE — ED Notes (Signed)
Pt vomited x1 at this time. Dr. Ellender Hose notified.

## 2021-12-02 NOTE — Consult Note (Addendum)
Pharmacy Antibiotic Note  Heather Lopez is a 76 y.o. female with medical history including diabetes, HTN, HLD, recent admission 07/17/21 - 08/03/21 for perforated duodenal ulcer, acute renal failure, and complicated UTI with bilateral hydronephrosis requiring ureteral stenting and cultured Candida glabrata. admitted on 12/02/2021 with  UTI, bilateral hydronephrosis, AKI on CKD stage IV .  Pharmacy has been consulted for cefepime, vancomycin, and fluconazole dosing.  Plan:  Cefepime 2 g IV q24h (renally adjusted)  Fluconazole 800 mg IV x 1 followed by 400 mg daily --Empirically covering for Candida glabrata as previously cultured which was susceptible-dose dependent --Will need to watch renal / liver function closely  Vancomycin 2 g IV x 1  --Dose per levels for now given unstable renal function --Daily Scr per protocol --Anticipate checking a random level 1/9 AM (depending on renal function trend)  Height: 5\' 3"  (160 cm) Weight: 92.1 kg (203 lb) IBW/kg (Calculated) : 52.4  Temp (24hrs), Avg:99 F (37.2 C), Min:99 F (37.2 C), Max:99 F (37.2 C)  Recent Labs  Lab 12/02/21 0408  WBC 15.2*  CREATININE 2.48*  LATICACIDVEN 1.4    Estimated Creatinine Clearance: 21.5 mL/min (A) (by C-G formula based on SCr of 2.48 mg/dL (H)).    Allergies  Allergen Reactions   Contrast Media  [Iodinated Contrast Media] Anaphylaxis   Iodine Swelling    (IV only) - angioedema    Antimicrobials this admission: Cefepime 1/7 >>  Fluconazole 1/7 >>  Vancomycin 1/7 >>   Dose adjustments this admission: N/A  Microbiology results: 1/7 BCx: pending 1/7 UCx: pending   Thank you for allowing pharmacy to be a part of this patients care.  Benita Gutter 12/02/2021 1:06 PM

## 2021-12-02 NOTE — ED Triage Notes (Signed)
Pt with indwelling urinary cath. Pt states she is having pelvic pain, that is constant. Pt without urine noted in drainage bag, no urine noted on bladder scan.

## 2021-12-02 NOTE — ED Notes (Signed)
Pt repositioned in bed at this time

## 2021-12-02 NOTE — H&P (Signed)
History and Physical  Heather Lopez WNU:[id number] DOB: 14-May-1946 DOA: 12/02/2021  Referring physician:  Duffy Bruce, MD PCP: [name], PA-C  Patient coming from: [facility]  Chief Complaint:  Pelvic Pain  HPI: Heather Lopez is a 76 y.o. female with medical history significant for  HTN, HLD, T2DM, Prior B/L ureteral stents, Left UPJ calculus, Right hydronephrosis, duodenal ulcer, obesity who presents to the emergency department due to several days of intermittent lower abdominal pain which rapidly worsened last night, pain was described as dull, constant and with sensation of squeezing of the intestines, it was rated as 9/10 on pain scale with no known alleviating/aggravating factors, she complained of nausea without vomiting.  Patient states that she has been intermittently passing some small stones. She was recently admitted from 06/27/2021 to 08/03/2021 due to septic shock, perforated duodenal ulcer, acute renal failure and pneumoperitoneum.  Bilateral ureteral stents was placed on 8/10 due to bilateral hydronephrosis and UTI during admission was positive for Candida glabrata (urine culture -07/05/2021) in which case high-dose Diflucan which was eventually changed to Amphotericin and Zosyn was recommended by ID. Left perirenal abscess with a drain was placed on 08/01/21.  CT of abdomen and pelvis on 11/07/2021 showed resolution of the left perirenal abscess and pigtail drainage was removed by allergy, she continued to have bilateral ureteral stents, though no hydronephrosis at that time.  She complained of subjective fever, but denies chest pain, shortness of breath, headache.  ED Course:  In the emergency department, she was intermittently tachypneic, but other vital signs were within normal range.  Work-up in the ED showed normal CBC except for leukocytosis, hyponatremia, hyperkalemia, hyperglycemia, BUN/creatinine 65/2.48 (baseline creatinine at 1.4-2.1), lipase 107, lactic acid 1.4,  procalcitonin 0.27, urinalysis was positive for UTI.  Influenza A, B, SARS coronavirus 2 was negative. CT abdomen and pelvis without contrast showed sacral decubitus wound with coccygeal osteomyelitis which is new since September. New bilateral hydronephrosis and hydroureter, right > left, despite bilateral double-J ureteral stents with adequate positioning. And the urinary bladder is also Dilated And Inflamed, with trace gas in the suspicious for Gas-forming Urinary Infection.  Bilateral nephrolithiasis unchanged from last month. Calcium gluconate was given, IV hydration was provided, patient was started on vancomycin and cefepime, morphine was given. Urologist was consulted by ED physician and recommended changing patient's Foley catheter, this was done and there was significant relief in patient's lower abdominal pain with drainage of turbid appearance urine in the Foley bag collection. General surgery was consulted and will follow along per ED physician Hospitalist was asked to admit patient for further evaluation and management.  Review of Systems: Review of systems as noted in the HPI. All other systems reviewed and are negative.   Past Medical History:  Diagnosis Date   Anxiety    Cataract    Depression    Diabetes mellitus without complication ([place])    Gout    History of kidney stones    Hyperlipidemia    Hypertension    Vertigo    Past Surgical History:  Procedure Laterality Date   CATARACT EXTRACTION, BILATERAL Bilateral 2019   COLONOSCOPY WITH PROPOFOL N/A 08/19/2015   Procedure: COLONOSCOPY WITH PROPOFOL;  Surgeon: Lucilla Lame, MD;  Location: [facility];  Service: Endoscopy;  Laterality: N/A;  diabetic - insulin   CYSTOSCOPY W/ RETROGRADES Bilateral 12/22/2019   Procedure: CYSTOSCOPY WITH RETROGRADE PYELOGRAM;  Surgeon: Abbie Sons, MD;  Location: [facility];  Service: Urology;  Laterality: Bilateral;  CYSTOSCOPY W/ URETERAL STENT PLACEMENT Bilateral 07/05/2021    Procedure: CYSTOSCOPY WITH RETROGRADE PYELOGRAM/BILATERAL URETERAL STENT PLACEMENT;  Surgeon: Abbie Sons, MD;  Location: [facility];  Service: Urology;  Laterality: Bilateral;   CYSTOSCOPY WITH BIOPSY N/A 12/22/2019   Procedure: CYSTOSCOPY WITH bladder BIOPSY;  Surgeon: Abbie Sons, MD;  Location: [facility];  Service: Urology;  Laterality: N/A;   CYSTOSCOPY WITH STENT PLACEMENT Right 12/22/2019   Procedure: CYSTOSCOPY WITH STENT PLACEMENT;  Surgeon: Abbie Sons, MD;  Location: [facility];  Service: Urology;  Laterality: Right;   CYSTOSCOPY WITH URETEROSCOPY Bilateral 12/22/2019   Procedure: CYSTOSCOPY WITH URETEROSCOPY;  Surgeon: Abbie Sons, MD;  Location: [facility];  Service: Urology;  Laterality: Bilateral;   CYSTOSCOPY/URETEROSCOPY/HOLMIUM LASER/STENT PLACEMENT Right 12/22/2019   Procedure: CYSTOSCOPY/URETEROSCOPY/HOLMIUM LASER/STENT PLACEMENT;  Surgeon: Abbie Sons, MD;  Location: [facility];  Service: Urology;  Laterality: Right;   DIALYSIS/PERMA CATHETER INSERTION N/A 06/29/2021   Procedure: DIALYSIS/PERMA CATHETER INSERTION;  Surgeon: Algernon Huxley, MD;  Location: [facility];  Service: Cardiovascular;  Laterality: N/A;   DIALYSIS/PERMA CATHETER INSERTION N/A 07/13/2021   Procedure: DIALYSIS/PERMA CATHETER INSERTION;  Surgeon: Algernon Huxley, MD;  Location: [place] [facility];  Service: Cardiovascular;  Laterality: N/A;   EYE SURGERY     IR GASTROSTOMY TUBE MOD SED  08/01/2021   LAPAROTOMY N/A 06/29/2021   Procedure: EXPLORATORY LAPAROTOMY WITH REPAIR OF DUODENAL PERFORATION;  Surgeon: [name], MD;  Location: [facility];  Service: General;  Laterality: N/A;   POLYPECTOMY  08/19/2015   Procedure: POLYPECTOMY;  Surgeon: Lucilla Lame, MD;  Location: [facility];  Service: Endoscopy;;   TUBAL LIGATION  1992    Social History:  reports that she quit smoking about 50 years ago. Her smoking use included cigarettes. She has a 4.50 pack-year smoking history. She has  never used smokeless tobacco. She reports that she does not drink alcohol and does not use drugs.   Allergies  Allergen Reactions   Contrast Media  [Iodinated Contrast Media] Anaphylaxis   Iodine Swelling    (IV only) - angioedema    Family History  Problem Relation Age of Onset   Diabetes Father    Cancer Father        lung cancer   Diabetes Brother    Stroke Mother      Prior to Admission medications   Medication Sig Start Date End Date Taking? Authorizing Provider  acetaminophen (TYLENOL) 325 MG tablet Take 650 mg by mouth every 6 (six) hours as needed.    [provider]  amLODipine (NORVASC) 10 MG tablet Place 1 tablet (10 mg total) into feeding tube daily. 08/04/21   Sharen Hones, MD  Ascorbic Acid 500 MG CHEW 500 mg by Per G Tube route 1 (one) time each day    [provider]  atorvastatin (LIPITOR) 20 MG tablet Take 1 tablet (20 mg total) by mouth daily. 04/15/18   Mikey College, NP  buPROPion (WELLBUTRIN XL) 150 MG 24 hr tablet Place 1 tablet into feeding tube daily 05/25/21   [provider]  busPIRone (BUSPAR) 5 MG tablet Take 5 mg by mouth 3 (three) times daily.    [provider]  ciprofloxacin (CIPRO) 400 MG/200ML SOLN Inject 400 mg into the vein every 12 (twelve) hours.    [provider]  cloNIDine (CATAPRES) 0.1 MG tablet Place 1 tablet (0.1 mg total) into feeding tube daily. 08/04/21   Sharen Hones, MD  epoetin alfa (  EPOGEN) 40000 UNIT/ML injection Inject 0.5 mLs (20,000 Units total) into the skin once a week. 08/04/21   Sharen Hones, MD  Exenatide ER (BYDUREON BCISE) 2 MG/0.85ML AUIJ     [provider]  ferrous sulfate 220 (44 Fe) MG/5ML solution 220 mg by Per G Tube route 1 (one) time each day    [provider]  fluticasone (FLONASE) 50 MCG/ACT nasal spray Place into both nostrils daily.    [provider]  GUAIFENESIN PO Take by mouth.    [provider]  heparin 5000 UNIT/ML  injection Inject 1 mL (5,000 Units total) into the skin every 8 (eight) hours. 08/03/21   Sharen Hones, MD  insulin aspart (NOVOLOG) 100 UNIT/ML injection Inject 0-20 Units into the skin every 4 (four) hours. 08/03/21   Sharen Hones, MD  insulin detemir (LEVEMIR) 100 UNIT/ML injection Inject 0.3 mLs (30 Units total) into the skin 2 (two) times daily. 08/03/21   Sharen Hones, MD  insulin glargine (LANTUS) 100 UNIT/ML injection Inject 40 Units into the skin daily.    [provider]  insulin regular (NOVOLIN R) 100 units/mL injection Inject into the skin 3 (three) times daily before meals. Sliding scale    [provider]  lansoprazole (PREVACID) 30 MG capsule 30 mg Do not crush or chew. GI route    [provider]  Lidocaine 4 % PTCH Apply topically.    [provider]  loperamide (IMODIUM A-D) 2 MG tablet Take 2 mg by mouth 4 (four) times daily as needed for diarrhea or loose stools.    [provider]  metoprolol tartrate (LOPRESSOR) 100 MG tablet Place 1 tablet (100 mg total) into feeding tube 2 (two) times daily. 08/03/21   Sharen Hones, MD  Miconazole Nitrate 2 % OINT Apply topically.    [provider]  mirtazapine (REMERON) 15 MG tablet 15 mg by Per G Tube route    [provider]  nutrition supplement, JUVEN, (JUVEN) PACK Place 1 packet into feeding tube 2 (two) times daily between meals. 08/03/21   Sharen Hones, MD  Nutritional Supplements (FEEDING SUPPLEMENT, GLUCERNA 1.5 CAL,) LIQD Place 1,000 mLs into feeding tube continuous. 08/03/21   Sharen Hones, MD  oxyCODONE-acetaminophen (PERCOCET/ROXICET) 5-325 MG tablet Take by mouth.    [provider]  pantoprazole sodium (PROTONIX) 40 mg PACK Place 20 mLs (40 mg total) into feeding tube 2 (two) times daily. 08/03/21   Sharen Hones, MD  polyethylene glycol powder (GLYCOLAX/MIRALAX) 17 GM/SCOOP powder 17 g 1 (one) time each day Gi tube    [provider]  potassium chloride SA  (KLOR-CON M) 20 MEQ tablet Take 1 tablet (20 mEq total) by mouth 2 (two) times daily. 11/07/21   Verlon Au, NP  senna-docusate (SENOKOT-S) 8.6-50 MG tablet 1 tablet by Per G Tube route 1 (one) time each day    [provider]  sitaGLIPtin (JANUVIA) 25 MG tablet Take by mouth.    [provider]  sodium zirconium cyclosilicate (LOKELMA) 10 g PACK packet Take by mouth.    [provider]  torsemide 40 MG TABS Place 40 mg into feeding tube daily. 08/04/21   Sharen Hones, MD  vancomycin IVPB Inject into the vein.    [provider]  Water For Irrigation, Sterile (FREE WATER) SOLN Place 125 mLs into feeding tube every 4 (four) hours. 08/03/21   Sharen Hones, MD    Physical Exam: BP 91/72    Pulse 66  Temp 99 F (37.2 C) (Oral)    Resp (!) 25    Ht 5\' 3"  (1.6 m)    Wt 92.1 kg    SpO2 99%    BMI 35.96 kg/m   General: 76 y.o. year-old female ill appearing but in no acute distress.  Alert and oriented x3. HEENT: Dry mucous membrane.  NCAT, EOMI Neck: Supple, trachea medial Cardiovascular: Regular rate and rhythm with no rubs or gallops.  No thyromegaly or JVD noted.  No lower extremity edema. 2/4 pulses in all 4 extremities. Respiratory: Clear to auscultation with no wheezes or rales. Good inspiratory effort. Abdomen: Soft, nontender nondistended with normal bowel sounds x4 quadrants. Muskuloskeletal: No cyanosis, clubbing or edema noted bilaterally Neuro: CN II-XII intact, strength 5/5 x 4, sensation, reflexes intact Skin: Sacral decubitus stage IV with a dressing intact.  Tender to palpation of the area.   Psychiatry:  Mood is appropriate for condition and setting          Labs on Admission:  Basic Metabolic Panel: Recent Labs  Lab 12/02/21 0408  NA 130*  K 6.0*  CL 106  CO2 14*  GLUCOSE 198*  BUN 65*  CREATININE 2.48*  CALCIUM 9.9   Liver Function Tests: Recent Labs  Lab 12/02/21 0408  AST 13*  ALT 9  ALKPHOS 158*  BILITOT 0.7  PROT  7.6  ALBUMIN 3.1*   Recent Labs  Lab 12/02/21 0408  LIPASE 107*   No results for input(s): AMMONIA in the last 168 hours. CBC: Recent Labs  Lab 12/02/21 0408  WBC 15.2*  HGB 12.3  HCT 39.3  MCV 83.1  PLT 314   Cardiac Enzymes: No results for input(s): CKTOTAL, CKMB, CKMBINDEX, TROPONINI in the last 168 hours.  BNP (last 3 results) Recent Labs    06/27/21 0032  BNP 141.4*    ProBNP (last 3 results) No results for input(s): PROBNP in the last 8760 hours.  CBG: No results for input(s): GLUCAP in the last 168 hours.  Radiological Exams on Admission: CT ABDOMEN PELVIS WO CONTRAST  Result Date: 12/02/2021 CLINICAL DATA:  76 year old female with abdominal pain and fever. History of pararenal abscess status post CT-guided drainage in September. EXAM: CT ABDOMEN AND PELVIS WITHOUT CONTRAST TECHNIQUE: Multidetector CT imaging of the abdomen and pelvis was performed following the standard protocol without IV contrast. COMPARISON:  CT Abdomen 11/07/2021 and earlier. FINDINGS: Lower chest: Mild left lung base fibrothorax is stable. No pericardial or pleural effusion. Negative right lung base. Hepatobiliary: Distended gallbladder but no pericholecystic inflammation. Negative noncontrast liver. Pancreas: Negative. Spleen: Negative. Adrenals/Urinary Tract: Adrenal glands remain within normal limits. Enlarged bilateral renal collecting systems since last month with mild to moderate left and moderate to severe right hydronephrosis despite bilateral double-J ureteral stents still in place. And there is right hydroureter and periureteral stranding along the course of the right stent. Underlying bilateral nephrolithiasis is stable. Ureteral stent positioning appears adequate. And no calculus is identified along the course of either stent. However, the bladder is both distended and inflamed (series 2, image 71) with trace gas also within the bladder (same image). Perivesical inflammatory stranding is  at least moderate (coronal image 49). Stomach/Bowel: Retained stool in the large bowel. Sigmoid diverticulosis. No convincing large bowel inflammation. Normal appendix visible on coronal image 54. Decompressed terminal ileum. No dilated small bowel. Fairly decompressed stomach and duodenum. No free air. No free fluid. Vascular/Lymphatic: Aortoiliac calcified atherosclerosis. Normal caliber abdominal aorta. Vascular patency is not evaluated in the  absence of IV contrast. No lymphadenopathy identified. Reproductive: Stable and within normal limits. Other: Presacral inflammatory stranding is mild and stable to decreased since September. Musculoskeletal: Advanced degeneration in the spine. Very severe degeneration of the right hip. Since September there is a new sacral decubitus wound with soft tissue ulceration to the tip of the coccyx (series 2, image 87). And a subtle new sclerosis and cortical erosion of the dorsal coccyx on sagittal image 68 in keeping with coccygeal osteomyelitis. Soft tissue thickening in the region but no organized or drainable fluid collection. IMPRESSION: 1. Since September there is a new Sacral Decubitus Wound with Coccygeal Osteomyelitis. No organized or drainable fluid collection there. 2. New bilateral hydronephrosis and hydroureter, right > left, despite bilateral double-J ureteral stents with adequate positioning. And the urinary bladder is also Dilated And Inflamed, with trace gas in the suspicious for Gas-forming Urinary Infection unless explained by recent catheterization. Bilateral nephrolithiasis unchanged from last month. 3. No bowel inflammation or obstruction.  Normal appendix. 4. Aortic Atherosclerosis (ICD10-I70.0). Severe right hip degeneration. Electronically Signed   By: Genevie Ilina M.D.   On: 12/02/2021 06:25    EKG: I independently viewed the EKG done and my findings are as followed: Normal sinus rhythm at a rate of 74 bpm  Assessment/Plan Present on Admission:  UTI  (urinary tract infection)  Essential hypertension  Duodenal ulcer perforation (HCC)  Principal Problem:   UTI (urinary tract infection) Active Problems:   Acute kidney injury superimposed on CKD (HCC)   Duodenal ulcer perforation (HCC)   Essential hypertension   Sacral decubitus ulcer   Acute osteomyelitis of coccyx (HCC)   Abdominal pain   Nausea & vomiting   Hyperkalemia   Hyponatremia   Hyperglycemia due to diabetes mellitus (HCC)   Elevated alkaline phosphatase level   Elevated lipase   Hypoalbuminemia due to protein-calorie malnutrition (HCC)   Obesity (BMI 30-39.9)   UTI POA Patient was empirically started on IV vancomycin and cefepime Urine culture  on 07/05/2021 was positive for Candida glabrata and she was initially treated with high-dose Diflucan which was eventually changed to Amphotericin and Zosyn; we shall continue with IV cefepime and Diflucan at this time with plan to de-escalate based on urine culture.  Bilateral hydronephrosis with superimposed acute kidney injury on CKD stage 4 CT abdomen and pelvis showed New bilateral hydronephrosis and hydroureter, right > left, despite bilateral double-J ureteral stents with adequate positioning. And the urinary bladder is also Dilated And Inflamed, with trace gas in the suspicious for Gas-forming Urinary Infection.  Bilateral nephrolithiasis unchanged from last month. Foley catheter was changed with improved drainage and decreased pain  BUN/creatinine 65/2.48 (baseline creatinine at 1.4-2.1) Continue IV hydration, continue Percocet as needed Renally adjust medications, avoid nephrotoxic agents/dehydration/hypotension Urology consult was appreciated  Abdominal pain, nausea and vomiting Continue Zofran as needed, continue Percocet as needed  Sacral decubitus ulcer with acute coccygeal osteomyelitis CT abdomen and pelvis with contrast showed CT abdomen and pelvis without contrast showed sacral decubitus wound with coccygeal  osteomyelitis which is new since September Patient states that the wound was being taken care of by a wound nurse and a wound physician who provides weekly monitoring and treatment. Procalcitonin was 0.27 Patient was started with IV vancomycin, we shall continue with same at this time Continue wound care General surgery was consulted per ED physician and we shall await further recommendations Infectious disease was consulted and we shall await further recommendations  Hyperkalemia Calcium gluconate was provided, no EKG  changes IV hydration was provided, repeat BMP and treat accordingly  Hyponatremia/dehydration Na 130. Corrected Na level based on CBG(198) = 132 IV hydration was provided, continue to monitor sodium levels with morning labs  Elevated lipase/Elevated alkaline phosphatase Lipase 107, alkaline phosphatase 158 Continue to monitor lipase and ALP levels  Perforated duodenal ulcer Continue Protonix  Hypoalbuminemia secondary to mild protein calorie malnutrition Albumin 3.1, increase protein in diet Patient allergic to iodine in protein supplements  Hyperglycemia secondary to T2DM Hemoglobin A1c on 07/09/2021 was 8.5 Continue ISS and hypoglycemic protocol Continue Levemir 10 units twice daily (patient takes Lantus 40 units twice daily at baseline) and adjust dose accordingly  Essential hypertension Continue hydralazine 10 mg every 6 hours as needed for SBP > 170  Mixed hyperlipidemia Continue Lipitor  Obesity (35.96 kg/m) Patient was counseled on diet and lifestyle modification   DVT prophylaxis: SCDs (consider starting patient on chemoprophylaxis if no indication for any surgical intervention s/p being seen by surgery)  Code Status: DNR  Family Communication: Husband at bedside (all questions answered to satisfaction)  Disposition Plan:  Patient is from:                        home Anticipated DC to:                   SNF or family members home Anticipated  DC date:               2-3 days Anticipated DC barriers:         Patient requires inpatient management due to severity of symptoms  Consults called: General surgery, infectious disease, urology  Admission status: Inpatient    Bernadette Hoit MD [facility]  12/02/2021, 10:13 AM

## 2021-12-02 NOTE — Consult Note (Signed)
Subjective:   CC: Sacral wound  HPI:  Heather Lopez is a 76 y.o. female who was consulted by Ellender Hose for evaluation of above.  First noted several months ago.  Symptoms include: Pain is sharp, minimal.  Exacerbated by pressure.  Alleviated by nothing specific.  Associated with nothing specific to wound but is actively having nausea and vomiting.  Was receiving wound care with wound VAC changes outside of hospital.  Was not reported to have any acute issues.  She was transferred to the hospital for possible UTI, and VAC was removed at that time.   Past Medical History:  has a past medical history of Anxiety, Cataract, Depression, Diabetes mellitus without complication ([place]), Gout, History of kidney stones, Hyperlipidemia, Hypertension, and Vertigo.  Past Surgical History:  has a past surgical history that includes Tubal ligation (1992); Colonoscopy with propofol (N/A, 08/19/2015); polypectomy (08/19/2015); Eye surgery; Cataract extraction, bilateral (Bilateral, 2019); Cystoscopy with biopsy (N/A, 12/22/2019); Cystoscopy w/ retrogrades (Bilateral, 12/22/2019); Cystoscopy with ureteroscopy (Bilateral, 12/22/2019); Cystoscopy with stent placement (Right, 12/22/2019); Cystoscopy/ureteroscopy/holmium laser/stent placement (Right, 12/22/2019); DIALYSIS/PERMA CATHETER INSERTION (N/A, 06/29/2021); laparotomy (N/A, 06/29/2021); Cystoscopy w/ ureteral stent placement (Bilateral, 07/05/2021); DIALYSIS/PERMA CATHETER INSERTION (N/A, 07/13/2021); and IR GASTROSTOMY TUBE MOD SED (08/01/2021).  Family History: family history includes Cancer in her father; Diabetes in her brother and father; Stroke in her mother.  Social History:  reports that she quit smoking about 50 years ago. Her smoking use included cigarettes. She has a 4.50 pack-year smoking history. She has never used smokeless tobacco. She reports that she does not drink alcohol and does not use drugs.  Current Medications:  Prior to Admission medications   Medication Sig  Start Date End Date Taking? Authorizing Provider  acetaminophen (TYLENOL) 325 MG tablet Place 650 mg into feeding tube every 6 (six) hours as needed for mild pain or moderate pain.   Yes [provider]  ascorbic acid (VITAMIN C) 500 MG tablet Place 500 mg into feeding tube every evening.   Yes [provider]  atorvastatin (LIPITOR) 20 MG tablet Take 1 tablet (20 mg total) by mouth daily. Patient taking differently: Take 10 mg by mouth at bedtime. 04/15/18  Yes Mikey College, NP  buPROPion (WELLBUTRIN) 100 MG tablet Place 100 mg into feeding tube daily.   Yes [provider]  busPIRone (BUSPAR) 5 MG tablet Place 5 mg into feeding tube daily.   Yes [provider]  cloNIDine (CATAPRES) 0.1 MG tablet Place 1 tablet (0.1 mg total) into feeding tube daily. Patient taking differently: Place 0.1 mg into feeding tube at bedtime. 08/04/21  Yes Sharen Hones, MD  cloNIDine (CATAPRES) 0.2 MG tablet Place 0.2 mg into feeding tube daily.   Yes [provider]  doxycycline (VIBRAMYCIN) 100 MG capsule Take 100 mg by mouth 2 (two) times daily. 11/09/21 12/06/21 Yes [provider]  ferrous sulfate 220 (44 Fe) MG/5ML solution Place 220 mg into feeding tube daily.   Yes [provider]  fluticasone (FLONASE) 50 MCG/ACT nasal spray Place 1 spray into both nostrils daily.   Yes [provider]  guaiFENesin (ROBITUSSIN) 100 MG/5ML liquid Take 10 mLs by mouth every 6 (six) hours as needed for cough or to loosen phlegm.   Yes [provider]  insulin glargine (LANTUS) 100 UNIT/ML injection Inject 40 Units into the skin 2 (two) times daily.   Yes [provider]  insulin regular (NOVOLIN R) 100 units/mL injection Inject 0-10 Units into the skin 4 (four) times  daily -  before meals and at bedtime. (Based upon sliding scale)   Yes [provider]  lansoprazole (PREVACID) 30 MG capsule Take 30 mg by mouth at bedtime.   Yes  [provider]  Lidocaine 4 % PTCH Apply 1 patch topically daily.   Yes [provider]  loperamide (IMODIUM A-D) 2 MG tablet Take 2 mg by mouth every 6 (six) hours as needed for diarrhea or loose stools.   Yes [provider]  metoprolol tartrate (LOPRESSOR) 100 MG tablet Place 1 tablet (100 mg total) into feeding tube 2 (two) times daily. 08/03/21  Yes Sharen Hones, MD  mirtazapine (REMERON) 15 MG tablet Place 15 mg into feeding tube at bedtime.   Yes [provider]  nutrition supplement, JUVEN, (JUVEN) PACK Place 1 packet into feeding tube 2 (two) times daily between meals. 08/03/21  Yes Sharen Hones, MD  Nutritional Supplements (FEEDING SUPPLEMENT, GLUCERNA 1.5 CAL,) LIQD Place 1,000 mLs into feeding tube continuous. 08/03/21  Yes Sharen Hones, MD  nystatin (MYCOSTATIN/NYSTOP) powder Apply 1 application topically 2 (two) times daily. (Apply under the breasts) 11/22/21 12/05/21 Yes [provider]  oxyCODONE-acetaminophen (PERCOCET/ROXICET) 5-325 MG tablet Take 1 tablet by mouth every 6 (six) hours as needed for moderate pain or severe pain.   Yes [provider]  polyethylene glycol (MIRALAX / GLYCOLAX) 17 g packet Take 17 g by mouth daily.   Yes [provider]  potassium chloride SA (KLOR-CON M) 20 MEQ tablet Take 1 tablet (20 mEq total) by mouth 2 (two) times daily. 11/07/21  Yes Verlon Au, NP  senna-docusate (SENOKOT-S) 8.6-50 MG tablet Place 1 tablet into feeding tube 2 (two) times daily.   Yes [provider]  sitaGLIPtin (JANUVIA) 25 MG tablet Place 25 mg into feeding tube daily.   Yes [provider]  sodium chloride flush (NS) 0.9 % SOLN Inject 10 mLs into the vein every 12 (twelve) hours. (For PICC line flush)   Yes [provider]  Water For Irrigation, Sterile (FREE WATER) SOLN Place 125 mLs into feeding tube every 4 (four) hours. 08/03/21  Yes Sharen Hones, MD  amLODipine (NORVASC) 10 MG tablet  Place 1 tablet (10 mg total) into feeding tube daily. Patient not taking: Reported on 12/02/2021 08/04/21   Sharen Hones, MD  ciprofloxacin (CIPRO) 250 MG tablet Take 250 mg by mouth 2 (two) times daily. 12/01/21 12/07/21  [provider]  epoetin alfa (EPOGEN) 40000 UNIT/ML injection Inject 0.5 mLs (20,000 Units total) into the skin once a week. Patient not taking: Reported on 12/02/2021 08/04/21   Sharen Hones, MD  heparin 5000 UNIT/ML injection Inject 1 mL (5,000 Units total) into the skin every 8 (eight) hours. Patient not taking: Reported on 12/02/2021 08/03/21   Sharen Hones, MD  insulin aspart (NOVOLOG) 100 UNIT/ML injection Inject 0-20 Units into the skin every 4 (four) hours. Patient not taking: Reported on 12/02/2021 08/03/21   Sharen Hones, MD  insulin detemir (LEVEMIR) 100 UNIT/ML injection Inject 0.3 mLs (30 Units total) into the skin 2 (two) times daily. Patient not taking: Reported on 12/02/2021 08/03/21   Sharen Hones, MD  pantoprazole sodium (PROTONIX) 40 mg PACK Place 20 mLs (40 mg total) into feeding tube 2 (two) times daily. Patient not taking: Reported on 12/02/2021 08/03/21   Sharen Hones, MD  torsemide 40 MG TABS Place 40 mg into feeding tube daily. Patient not taking: Reported on 12/02/2021 08/04/21   Sharen Hones, MD    Allergies:  Allergies  Allergen Reactions   Contrast Media  [Iodinated Contrast Media] Anaphylaxis   Iodine Swelling    (IV only) - angioedema    ROS:  General: Denies weight loss, weight gain, fatigue, fevers, chills, and night sweats. Eyes: Denies blurry vision, double vision, eye pain, itchy eyes, and tearing. Ears: Denies hearing loss, earache, and ringing in ears. Nose: Denies sinus pain, congestion, infections, runny nose, and nosebleeds. Mouth/throat: Denies hoarseness, sore throat, bleeding gums, and difficulty swallowing. Heart: Denies chest pain, palpitations, racing heart, irregular heartbeat, leg pain or swelling, and decreased activity  tolerance. Respiratory: Denies breathing difficulty, shortness of breath, wheezing, cough, and sputum. GI: Denies change in appetite, heartburn, constipation, diarrhea, and blood in stool. GU: Denies difficulty urinating, pain with urinating, urgency, frequency, blood in urine. Musculoskeletal: Denies joint stiffness, pain, swelling, muscle weakness. Skin: Denies rash, itching, mass, tumors, sores, and boils Neurologic: Denies headache, fainting, dizziness, seizures, numbness, and tingling. Psychiatric: Denies depression, anxiety, difficulty sleeping, and memory loss. Endocrine: Denies heat or cold intolerance, and increased thirst or urination. Blood/lymph: Denies easy bruising, easy bruising, and swollen glands     Objective:     BP (!) 149/57    Pulse 66    Temp 99 F (37.2 C) (Oral)    Resp 20    Ht 5\' 3"  (1.6 m)    Wt 92.1 kg    SpO2 99%    BMI 35.96 kg/m   Constitutional :  alert, cooperative, appears stated age, and mild distress  Lymphatics/Throat:  no asymmetry, masses, or scars  Respiratory:  clear to auscultation bilaterally  Cardiovascular:  regular rate and rhythm  Gastrointestinal: soft, non-tender; bowel sounds normal; no masses,  no organomegaly.  Musculoskeletal: Steady gait and movement  Skin: Cool and moist, see pic below of sacral wound.  Small opening at base of wound has visible sacral bone that is minimally tender to palpation.  No obvious exudative or purulent drainage from the area.  Otherwise healthy granulation tissue at the surrounding edges.  Psychiatric: Normal affect, non-agitated, not confused          LABS:  CMP Latest Ref Rng & Units 12/02/2021 11/07/2021 10/31/2021  Glucose 70 - 99 mg/dL 198(H) 110(H) 126(H)  BUN 8 - 23 mg/dL 65(H) 19 24(H)  Creatinine 0.44 - 1.00 mg/dL 2.48(H) 1.76(H) 1.53(H)  Sodium 135 - 145 mmol/L 130(L) 136 136  Potassium 3.5 - 5.1 mmol/L 6.0(H) 2.8(L) 3.3(L)  Chloride 98 - 111 mmol/L 106 101 99  CO2 22 - 32 mmol/L 14(L)  23 25  Calcium 8.9 - 10.3 mg/dL 9.9 9.2 9.4  Total Protein 6.5 - 8.1 g/dL 7.6 - -  Total Bilirubin 0.3 - 1.2 mg/dL 0.7 - -  Alkaline Phos 38 - 126 U/L 158(H) - -  AST 15 - 41 U/L 13(L) - -  ALT 0 - 44 U/L 9 - -   CBC Latest Ref Rng & Units 12/02/2021 11/21/2021 11/07/2021  WBC 4.0 - 10.5 K/uL 15.2(H) - 10.0  Hemoglobin 12.0 - 15.0 g/dL 12.3 10.9(L) 9.6(L)  Hematocrit 36.0 - 46.0 % 39.3 36.6 31.1(L)  Platelets 150 - 400 K/uL 314 - 284    RADS: CLINICAL DATA:  76 year old female with abdominal pain and fever. History of pararenal abscess status post CT-guided drainage in September.   EXAM: CT ABDOMEN AND PELVIS WITHOUT CONTRAST   TECHNIQUE: Multidetector CT imaging of the abdomen and pelvis was performed following the standard protocol without IV contrast.   COMPARISON:  CT Abdomen  11/07/2021 and earlier.   FINDINGS: Lower chest: Mild left lung base fibrothorax is stable. No pericardial or pleural effusion. Negative right lung base.   Hepatobiliary: Distended gallbladder but no pericholecystic inflammation. Negative noncontrast liver.   Pancreas: Negative.   Spleen: Negative.   Adrenals/Urinary Tract: Adrenal glands remain within normal limits.   Enlarged bilateral renal collecting systems since last month with mild to moderate left and moderate to severe right hydronephrosis despite bilateral double-J ureteral stents still in place. And there is right hydroureter and periureteral stranding along the course of the right stent. Underlying bilateral nephrolithiasis is stable. Ureteral stent positioning appears adequate. And no calculus is identified along the course of either stent.   However, the bladder is both distended and inflamed (series 2, image 71) with trace gas also within the bladder (same image). Perivesical inflammatory stranding is at least moderate (coronal image 49).   Stomach/Bowel: Retained stool in the large bowel. Sigmoid diverticulosis. No  convincing large bowel inflammation. Normal appendix visible on coronal image 54. Decompressed terminal ileum. No dilated small bowel. Fairly decompressed stomach and duodenum. No free air. No free fluid.   Vascular/Lymphatic: Aortoiliac calcified atherosclerosis. Normal caliber abdominal aorta. Vascular patency is not evaluated in the absence of IV contrast. No lymphadenopathy identified.   Reproductive: Stable and within normal limits.   Other: Presacral inflammatory stranding is mild and stable to decreased since September.   Musculoskeletal: Advanced degeneration in the spine. Very severe degeneration of the right hip.   Since September there is a new sacral decubitus wound with soft tissue ulceration to the tip of the coccyx (series 2, image 87). And a subtle new sclerosis and cortical erosion of the dorsal coccyx on sagittal image 68 in keeping with coccygeal osteomyelitis. Soft tissue thickening in the region but no organized or drainable fluid collection.   IMPRESSION: 1. Since September there is a new Sacral Decubitus Wound with Coccygeal Osteomyelitis. No organized or drainable fluid collection there.   2. New bilateral hydronephrosis and hydroureter, right > left, despite bilateral double-J ureteral stents with adequate positioning. And the urinary bladder is also Dilated And Inflamed, with trace gas in the suspicious for Gas-forming Urinary Infection unless explained by recent catheterization. Bilateral nephrolithiasis unchanged from last month.   3. No bowel inflammation or obstruction.  Normal appendix.   4. Aortic Atherosclerosis (ICD10-I70.0). Severe right hip degeneration.     Electronically Signed   By: Genevie Malcolm M.D.   On: 12/02/2021 06:25    Assessment:   Sacral ulcer.  Images above reviewed by myself and agree with assessment and is also consistent with clinical exam  UTI, bilateral hydronephrosis   Plan:     No need for any additional  debridement at this time.  Cultures can likely be taken at bedside due to the obviously exposed bone if needed.  We will continue wet-to-dry dressing changes, and Mepilex to cover the wound until wound VAC supplies are available at bedside.  Further medical care for her other acute issues per medical team

## 2021-12-02 NOTE — ED Notes (Signed)
Admitting doctor at bedside at this time.

## 2021-12-02 NOTE — ED Notes (Signed)
Pt asleep at this time states that the compazine did help with her NV.

## 2021-12-02 NOTE — ED Notes (Signed)
First nurse note: pt from [facility] via [facility]. Pt c/o lower abdominal pain and a fever.

## 2021-12-02 NOTE — Progress Notes (Signed)
Consult rec'd for this patient with single lumen picc.  Advised to obtain cxr prior to use.

## 2021-12-02 NOTE — Consult Note (Signed)
PHARMACY -  BRIEF ANTIBIOTIC NOTE   Pharmacy has received consult(s) for cefepime and vancomycin from an ED provider.  The patient's profile has been reviewed for ht/wt/allergies/indication/available labs.    One time order(s) placed for  Cefepime 2 gram Vancomycin 2 gram   Further antibiotics/pharmacy consults should be ordered by admitting physician if indicated.                       Thank you, Dorothe Pea, PharmD, BCPS Clinical Pharmacist   12/02/2021  6:50 AM

## 2021-12-02 NOTE — ED Notes (Signed)
Pt states that nausea medication is not helping at this time, states she is vomiting every hour. Pt did have one episode of vomiting in the room. Dr. Josephine Cables made aware. See MAR orders. Pt's gown and sheet soiled with vomit. Pt states does not want me change sheet right now, states "I want to be done vomiting first."

## 2021-12-02 NOTE — Progress Notes (Signed)
RN called due to patient's repeat potassium being at 6.3 [place], IV hydration, insulin/dextrose were given.  Calcium gluconate was already given earlier. Continue to monitor potassium level

## 2021-12-02 NOTE — ED Provider Notes (Signed)
Assumed care from Dr. Leonides Schanz at 7 AM. Briefly, the patient is a 76 y.o. female with PMHx of  has a past medical history of Anxiety, Cataract, Depression, Diabetes mellitus without complication ([place]), Gout, History of kidney stones, Hyperlipidemia, Hypertension, and Vertigo. here with sepsis. Likely urinary, plan to f/u urine, admit to medicine.   Labs Reviewed  COMPREHENSIVE METABOLIC PANEL - Abnormal; Notable for the following components:      Result Value   Sodium 130 (*)    Potassium 6.0 (*)    CO2 14 (*)    Glucose, Bld 198 (*)    BUN 65 (*)    Creatinine, Ser 2.48 (*)    Albumin 3.1 (*)    AST 13 (*)    Alkaline Phosphatase 158 (*)    GFR, Estimated 20 (*)    All other components within normal limits  CBC - Abnormal; Notable for the following components:   WBC 15.2 (*)    RDW 16.8 (*)    All other components within normal limits  URINALYSIS, ROUTINE W REFLEX MICROSCOPIC - Abnormal; Notable for the following components:   Color, Urine YELLOW (*)    APPearance TURBID (*)    Hgb urine dipstick MODERATE (*)    Protein, ur 100 (*)    Leukocytes,Ua MODERATE (*)    RBC / HPF >50 (*)    WBC, UA >50 (*)    Bacteria, UA MANY (*)    All other components within normal limits  LIPASE, BLOOD - Abnormal; Notable for the following components:   Lipase 107 (*)    All other components within normal limits  CULTURE, BLOOD (ROUTINE X 2)  CULTURE, BLOOD (ROUTINE X 2)  URINE CULTURE  RESP PANEL BY RT-PCR (FLU A&B, COVID) [id number]  LACTIC ACID, PLASMA  PROCALCITONIN    Course of Care: -Reviewed labs. Pt here with sepsis and AKI likely from recurrent obstructive uropathy, which I suspect could be due to clogged foley as it is bilateral with marked bladder distension on CT. Discussed case with Dr. Claudia Desanctis of Urology, who recommends replacing foley and medical tx at this time. Discussed case with Dr. Tobe Sos of hospitalist who asked that I reach out to surgery re: sacral wound. Discussed with Dr. Lysle Pearl  who will follow along, recommends ID consult for recommendations re: possible biopsy. On my exam, the wound appears to be relatively clean though deep. - EKG reviewed by me, does show some slight peaking of T waves. Calcium, insulin given for temporization. Foley replaced. Will hold on veltassa as I suspect her K will drop significantly now that her obstruction is relieved.  .Critical Care Performed by: Duffy Bruce, MD Authorized by: Duffy Bruce, MD   Critical care provider statement:    Critical care time (minutes):  30   Critical care was necessary to treat or prevent imminent or life-threatening deterioration of the following conditions:  Cardiac failure, circulatory failure, respiratory failure and sepsis   Critical care was time spent personally by me on the following activities:  Development of treatment plan with patient or surrogate, discussions with consultants, evaluation of patient's response to treatment, examination of patient, ordering and review of laboratory studies, ordering and review of radiographic studies, ordering and performing treatments and interventions, pulse oximetry, re-evaluation of patient's condition and review of old charts   Care discussed with: admitting provider        Duffy Bruce, MD 12/02/21 [phone]

## 2021-12-02 NOTE — ED Notes (Signed)
Pt repositioned on L side at this time.

## 2021-12-02 NOTE — Consult Note (Signed)
NAME: Heather Lopez  DOB: [birth year]  MRN: [id number]  Date/Time: 12/02/2021 5:17 PM  REQUESTING PROVIDER; Lynnell Dike Subjective:  REASON FOR CONSULT: sacral decubitus ? Heather Lopez is a 76 y.o. 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 In the ED BP 181/68, temperature 99, pulse 81, respiratory 18 and sats 98%. WBC 15.2, Hb 12.3, platelet 314, creatinine 2.37.  It was 1.76 on 11/07/2021.  UA showed more than 50 WBC.  Blood culture and urine culture was sent.  CT scan of the abdomen done showed new sacral decubitus with coccygeal osteomyelitis.  New bilateral hydronephrosis and hydroureter.  Right more than left despite bilateral double-J ureteral stents with adequate positioning.  Urinary bladder is also dilated and inflamed.  Trace gas and suspicious for gas-forming urinary tract infection.  Bilateral nephrolithiasis unchanged from last month.  Gallbladder distended but there is no pericholecystic inflammation. Patient has been started on IV vancomycin, cefepime and fluconazole. I am asked to see the patient for the above.  . Past medical history 06/23/2021 patient had presented to urgent care with cough and body ache.  She was sent to the ED.  She was found to have acute kidney injury.  The creatinine was 9.32 from 1.97.  She also had a potassium of 5.5 at that admission.  Dialysis catheter was placed on 06/29/2021.  On the night of 06/29/2021 she developed severe abdominal pain and was diagnosed with large amount of pneumoperitoneum on CT abdomen with free fluid.  A left renal mass measuring 6.1 into 8 into 4.1 cm suspicious for renal cell carcinoma was also noted.  She will come to the ER on 06/30/2021 and underwent expiratory laparotomy and primary repair of the  perforated duodenal ulcer with creation of an omental flap.  She was sent to ICU.  She remained intubated.  She was also on dialysis.  As she continued to spike fever and with leukocytosis repeat CT scan was done on 07/04/2021 and that revealed a 14 mm left UPJ stone with mild left hydronephrosis and left upper pole renal lesion.  There was also right hydroureteronephrosis.  Urine culture was yeast.  Urology saw the patient and she underwent cystoscopy with bilateral ureteral stent placement.  Left pyelonephrosis was seen and culture was sent from left renal pelvis urine and it showed 70,000 colonies of Candida glabrata.  The right kidney urine was also sent and it had 100,000 colonies of yeast.  I saw the patient on 07/07/2021.  Patient initially was placed on fluconazole 200 mg and increased to 400 mg and later changed to Amphotericin because of persistent fever on 07/20/2021.[name]  She also had a left perirenal and renal abscess for which a drain was placed on 07/21/2021.  The culture was again positive for Candida glabrata.  She was on intermittent dialysis.  Because of persistent leukocytosis a repeat CT scan done on 07/29/2021 showed a 10 cm into 2.29 cm anterior pararenal space collection not in communication with the drain and hence the old drain was removed and a new drain was placed on 08/01/2021. She also had a PEG  placement on 08/01/2021. She was transferred to [facility] on 08/03/2021.  Following which [facility] went to [facility].  She saw the urologist Dr. Bernardo Heater on 10/26/2021 and he was planning  to schedule ureteroscopy for definitive stone treatment.  drain dell out by itselfPatient had a CT abdomen done on 11/07/2021 and that CT scan is mentioned that the drains had been removed and also the size of the pararenal abscess was much smaller. Past Medical History:  Diagnosis Date   Anxiety    Cataract    Depression    Diabetes mellitus without complication ([place])    Gout    History of kidney stones     Hyperlipidemia    Hypertension    Vertigo     Past Surgical History:  Procedure Laterality Date   CATARACT EXTRACTION, BILATERAL Bilateral 2019   COLONOSCOPY WITH PROPOFOL N/A 08/19/2015   Procedure: COLONOSCOPY WITH PROPOFOL;  Surgeon: Lucilla Lame, MD;  Location: [place];  Service: Endoscopy;  Laterality: N/A;  diabetic - insulin   CYSTOSCOPY W/ RETROGRADES Bilateral 12/22/2019   Procedure: CYSTOSCOPY WITH RETROGRADE PYELOGRAM;  Surgeon: Abbie Sons, MD;  Location: [facility];  Service: Urology;  Laterality: Bilateral;   CYSTOSCOPY W/ URETERAL STENT PLACEMENT Bilateral 07/05/2021   Procedure: CYSTOSCOPY WITH RETROGRADE PYELOGRAM/BILATERAL URETERAL STENT PLACEMENT;  Surgeon: Abbie Sons, MD;  Location: [facility];  Service: Urology;  Laterality: Bilateral;   CYSTOSCOPY WITH BIOPSY N/A 12/22/2019   Procedure: CYSTOSCOPY WITH bladder BIOPSY;  Surgeon: Abbie Sons, MD;  Location: [facility];  Service: Urology;  Laterality: N/A;   CYSTOSCOPY WITH STENT PLACEMENT Right 12/22/2019   Procedure: CYSTOSCOPY WITH STENT PLACEMENT;  Surgeon: Abbie Sons, MD;  Location: [facility];  Service: Urology;  Laterality: Right;   CYSTOSCOPY WITH URETEROSCOPY Bilateral 12/22/2019   Procedure: CYSTOSCOPY WITH URETEROSCOPY;  Surgeon: Abbie Sons, MD;  Location: [facility];  Service: Urology;  Laterality: Bilateral;   CYSTOSCOPY/URETEROSCOPY/HOLMIUM LASER/STENT PLACEMENT Right 12/22/2019   Procedure: CYSTOSCOPY/URETEROSCOPY/HOLMIUM LASER/STENT PLACEMENT;  Surgeon: Abbie Sons, MD;  Location: [facility];  Service: Urology;  Laterality: Right;   DIALYSIS/PERMA CATHETER INSERTION N/A 06/29/2021   Procedure: DIALYSIS/PERMA CATHETER INSERTION;  Surgeon: Algernon Huxley, MD;  Location: [place] [facility];  Service: Cardiovascular;  Laterality: N/A;   DIALYSIS/PERMA CATHETER INSERTION N/A 07/13/2021   Procedure: DIALYSIS/PERMA CATHETER INSERTION;  Surgeon: Algernon Huxley, MD;  Location: [place] [facility];  Service: Cardiovascular;  Laterality: N/A;   EYE SURGERY     IR GASTROSTOMY TUBE MOD SED  08/01/2021   LAPAROTOMY N/A 06/29/2021   Procedure: EXPLORATORY LAPAROTOMY WITH REPAIR OF DUODENAL PERFORATION;  Surgeon: [name], MD;  Location: [facility];  Service: General;  Laterality: N/A;   POLYPECTOMY  08/19/2015   Procedure: POLYPECTOMY;  Surgeon: Lucilla Lame, MD;  Location: [place];  Service: Endoscopy;;   TUBAL LIGATION  1992    Social History   Socioeconomic History   Marital status: Married    Spouse name: Not on file   Number of children: Not on file   Years of education: Not on file   Highest education level: Not on file  Occupational History   Not on file  Tobacco Use   Smoking status: Former    Packs/day: 0.75    Years: 6.00    Pack years: 4.50    Types: Cigarettes    Quit date: 27    Years since quitting: 50.0   Smokeless tobacco: Never   Tobacco comments:    quit 1973  Vaping Use   Vaping Use: Never used  Substance and Sexual Activity   Alcohol use: No    Alcohol/week: 0.0 standard drinks  Drug use: No   Sexual activity: Not on file  Other Topics Concern   Not on file  Social History Narrative   Not on file   Social Determinants of Health   Financial Resource Strain: Not on file  Food Insecurity: Not on file  Transportation Needs: Not on file  Physical Activity: Not on file  Stress: Not on file  Social Connections: Not on file  Intimate Partner Violence: Not on file    Family History  Problem Relation Age of Onset   Diabetes Father    Cancer Father        lung cancer   Diabetes Brother    Stroke Mother    Allergies  Allergen Reactions   Contrast Media  [Iodinated Contrast Media] Anaphylaxis   Iodine Swelling    (IV only) - angioedema   I? Current Facility-Administered Medications  Medication Dose Route Frequency Provider Last Rate Last Admin   0.9 %  sodium chloride infusion   Intravenous Continuous Adefeso, Oladapo, DO  125 mL/hr at 12/02/21 1630 Restarted at 12/02/21 1630   atorvastatin (LIPITOR) tablet 20 mg  20 mg Oral Daily Adefeso, Oladapo, DO       [START ON 12/03/2021] ceFEPIme (MAXIPIME) 2 g in sodium chloride 0.9 % 100 mL IVPB  2 g Intravenous Daily Benita Gutter, RPH       [START ON 12/03/2021] fluconazole (DIFLUCAN) IVPB 400 mg  400 mg Intravenous Daily Benita Gutter, RPH       hydrALAZINE (APRESOLINE) injection 10 mg  10 mg Intravenous Q6H PRN Adefeso, Oladapo, DO       insulin aspart (novoLOG) injection 0-15 Units  0-15 Units Subcutaneous TID WC Adefeso, Oladapo, DO   3 Units at 12/02/21 1227   insulin aspart (novoLOG) injection 0-5 Units  0-5 Units Subcutaneous QHS Adefeso, Oladapo, DO       insulin glargine-yfgn (SEMGLEE) injection 10 Units  10 Units Subcutaneous BID Adefeso, Oladapo, DO       morphine 4 MG/ML injection 4 mg  4 mg Intravenous Once Adefeso, Oladapo, DO       ondansetron (ZOFRAN) injection 4 mg  4 mg Intravenous Q6H PRN Adefeso, Oladapo, DO       oxyCODONE-acetaminophen (PERCOCET/ROXICET) 5-325 MG per tablet 1 tablet  1 tablet Oral Q30 min PRN Adefeso, Oladapo, DO   1 tablet at 12/02/21 0411   prochlorperazine (COMPAZINE) injection 10 mg  10 mg Intravenous Q6H PRN Adefeso, Oladapo, DO   10 mg at 12/02/21 1226   vancomycin variable dose per unstable renal function (pharmacist dosing)   Does not apply See admin instructions Benita Gutter [facility]       Current Outpatient Medications  Medication Sig Dispense Refill   acetaminophen (TYLENOL) 325 MG tablet Place 650 mg into feeding tube every 6 (six) hours as needed for mild pain or moderate pain.     ascorbic acid (VITAMIN C) 500 MG tablet Place 500 mg into feeding tube every evening.     atorvastatin (LIPITOR) 20 MG tablet Take 1 tablet (20 mg total) by mouth daily. (Patient taking differently: Take 10 mg by mouth at bedtime.) 90 tablet 3   buPROPion (WELLBUTRIN) 100 MG tablet Place 100 mg into feeding tube daily.     busPIRone  (BUSPAR) 5 MG tablet Place 5 mg into feeding tube daily.     cloNIDine (CATAPRES) 0.1 MG tablet Place 1 tablet (0.1 mg total) into feeding tube daily. (Patient taking differently: Place 0.1 mg into feeding  tube at bedtime.) 60 tablet 0   cloNIDine (CATAPRES) 0.2 MG tablet Place 0.2 mg into feeding tube daily.     doxycycline (VIBRAMYCIN) 100 MG capsule Take 100 mg by mouth 2 (two) times daily.     ferrous sulfate 220 (44 Fe) MG/5ML solution Place 220 mg into feeding tube daily.     fluticasone (FLONASE) 50 MCG/ACT nasal spray Place 1 spray into both nostrils daily.     guaiFENesin (ROBITUSSIN) 100 MG/5ML liquid Take 10 mLs by mouth every 6 (six) hours as needed for cough or to loosen phlegm.     insulin glargine (LANTUS) 100 UNIT/ML injection Inject 40 Units into the skin 2 (two) times daily.     insulin regular (NOVOLIN R) 100 units/mL injection Inject 0-10 Units into the skin 4 (four) times daily -  before meals and at bedtime. (Based upon sliding scale)     lansoprazole (PREVACID) 30 MG capsule Take 30 mg by mouth at bedtime.     Lidocaine 4 % PTCH Apply 1 patch topically daily.     loperamide (IMODIUM A-D) 2 MG tablet Take 2 mg by mouth every 6 (six) hours as needed for diarrhea or loose stools.     metoprolol tartrate (LOPRESSOR) 100 MG tablet Place 1 tablet (100 mg total) into feeding tube 2 (two) times daily.     mirtazapine (REMERON) 15 MG tablet Place 15 mg into feeding tube at bedtime.     nutrition supplement, JUVEN, (JUVEN) PACK Place 1 packet into feeding tube 2 (two) times daily between meals.  0   Nutritional Supplements (FEEDING SUPPLEMENT, GLUCERNA 1.5 CAL,) LIQD Place 1,000 mLs into feeding tube continuous.     nystatin (MYCOSTATIN/NYSTOP) powder Apply 1 application topically 2 (two) times daily. (Apply under the breasts)     oxyCODONE-acetaminophen (PERCOCET/ROXICET) 5-325 MG tablet Take 1 tablet by mouth every 6 (six) hours as needed for moderate pain or severe pain.      polyethylene glycol (MIRALAX / GLYCOLAX) 17 g packet Take 17 g by mouth daily.     potassium chloride SA (KLOR-CON M) 20 MEQ tablet Take 1 tablet (20 mEq total) by mouth 2 (two) times daily. 60 tablet 0   senna-docusate (SENOKOT-S) 8.6-50 MG tablet Place 1 tablet into feeding tube 2 (two) times daily.     sitaGLIPtin (JANUVIA) 25 MG tablet Place 25 mg into feeding tube daily.     sodium chloride flush (NS) 0.9 % SOLN Inject 10 mLs into the vein every 12 (twelve) hours. (For PICC line flush)     Water For Irrigation, Sterile (FREE WATER) SOLN Place 125 mLs into feeding tube every 4 (four) hours.     amLODipine (NORVASC) 10 MG tablet Place 1 tablet (10 mg total) into feeding tube daily. (Patient not taking: Reported on 12/02/2021)     ciprofloxacin (CIPRO) 250 MG tablet Take 250 mg by mouth 2 (two) times daily.     epoetin alfa (EPOGEN) 40000 UNIT/ML injection Inject 0.5 mLs (20,000 Units total) into the skin once a week. (Patient not taking: Reported on 12/02/2021) 1 mL    heparin 5000 UNIT/ML injection Inject 1 mL (5,000 Units total) into the skin every 8 (eight) hours. (Patient not taking: Reported on 12/02/2021) 1 mL    insulin aspart (NOVOLOG) 100 UNIT/ML injection Inject 0-20 Units into the skin every 4 (four) hours. (Patient not taking: Reported on 12/02/2021) 10 mL 0   insulin detemir (LEVEMIR) 100 UNIT/ML injection Inject 0.3 mLs (30 Units total) into  the skin 2 (two) times daily. (Patient not taking: Reported on 12/02/2021) 10 mL 11   pantoprazole sodium (PROTONIX) 40 mg PACK Place 20 mLs (40 mg total) into feeding tube 2 (two) times daily. (Patient not taking: Reported on 12/02/2021) 30 packet    torsemide 40 MG TABS Place 40 mg into feeding tube daily. (Patient not taking: Reported on 12/02/2021)       Abtx:  Anti-infectives (From admission, onward)    Start     Dose/Rate Route Frequency Ordered Stop   12/03/21 1000  ceFEPIme (MAXIPIME) 2 g in sodium chloride 0.9 % 100 mL IVPB        2 g 200 mL/hr  over 30 Minutes Intravenous Daily 12/02/21 1102     12/03/21 1000  fluconazole (DIFLUCAN) IVPB 400 mg        400 mg 100 mL/hr over 120 Minutes Intravenous Daily 12/02/21 1315     12/02/21 1200  fluconazole (DIFLUCAN) IVPB 800 mg        800 mg 200 mL/hr over 120 Minutes Intravenous  Once 12/02/21 1103 12/02/21 1417   12/02/21 1102  vancomycin variable dose per unstable renal function (pharmacist dosing)         Does not apply See admin instructions 12/02/21 1102     12/02/21 0730  vancomycin (VANCOREADY) IVPB 2000 mg/400 mL        2,000 mg 200 mL/hr over 120 Minutes Intravenous  Once 12/02/21 0650 12/02/21 1153   12/02/21 0700  ceFEPIme (MAXIPIME) 2 g in sodium chloride 0.9 % 100 mL IVPB        2 g 200 mL/hr over 30 Minutes Intravenous  Once 12/02/21 0649 12/02/21 0949   12/02/21 0700  vancomycin (VANCOCIN) IVPB 1000 mg/200 mL premix  Status:  Discontinued        1,000 mg 200 mL/hr over 60 Minutes Intravenous  Once 12/02/21 0649 12/02/21 0650       REVIEW OF SYSTEMS:  Const: negative fever, negative chills, negative weight loss Eyes: negative diplopia or visual changes, negative eye pain ENT: negative coryza, negative sore throat Resp: negative cough, hemoptysis, dyspnea Cards: negative for chest pain, palpitations, lower extremity edema GU: negative for frequency, dysuria and hematuria GI:++r abdominal pain, no diarrhea, bleeding, constipation Skin: negative for rash and pruritus Heme: negative for easy bruising and gum/nose bleeding MS: weakness, pain rt hip says she needs hip replacement Neurolo:negative for headaches, dizziness, vertigo, memory problems  Psych: negative for feelings of anxiety, depression  Endocrine: negative for thyroid, diabetes Allergy/Immunology- negative for any medication or food allergies ? Pertinent Positives include : Objective:  VITALS:  BP (!) 181/68    Pulse 81    Temp 99 F (37.2 C) (Oral)    Resp 18    Ht 5\' 3"  (1.6 m)    Wt 92.1 kg    SpO2  98%    BMI 35.96 kg/m  PHYSICAL EXAM:  General: Alert, cooperative, no distress, appears stated age.  Head: Normocephalic, without obvious abnormality, atraumatic. Eyes: Conjunctivae clear, anicteric sclerae. Pupils are equal ENT Nares normal. No drainage or sinus tenderness. Lips, mucosa, and tongue normal. No Thrush Neck: Supple, symmetrical, no adenopathy, thyroid: non tender no carotid bruit and no JVD. Back: No CVA tenderness. Lungs: Clear to auscultation bilaterally. No Wheezing or Rhonchi. No rales. Heart: Regular rate and rhythm, no murmur, rub or gallop. Abdomen: Soft, non-tender,not distended. Bowel sounds normal. No masses Extremities: atraumatic, no cyanosis. No edema. No clubbing Skin: No rashes or  lesions. Or bruising Lymph: Cervical, supraclavicular normal. Neurologic: Grossly non-focal Pertinent Labs Lab Results CBC    Component Value Date/Time   WBC 15.2 (H) 12/02/2021 0408   RBC 4.73 12/02/2021 0408   HGB 12.3 12/02/2021 0408   HGB 13.9 01/29/2014 0406   HCT 39.3 12/02/2021 0408   HCT 42.4 01/28/2014 0513   PLT 314 12/02/2021 0408   PLT 217 01/29/2014 0406   MCV 83.1 12/02/2021 0408   MCV 89 01/28/2014 0513   MCH 26.0 12/02/2021 0408   MCHC 31.3 12/02/2021 0408   RDW 16.8 (H) 12/02/2021 0408   RDW 14.2 01/28/2014 0513   LYMPHSABS 1.6 11/07/2021 1227   LYMPHSABS 1.3 01/28/2014 0513   MONOABS 0.8 11/07/2021 1227   MONOABS 0.4 01/28/2014 0513   EOSABS 0.2 11/07/2021 1227   EOSABS 0.0 01/28/2014 0513   BASOSABS 0.0 11/07/2021 1227   BASOSABS 0.1 01/28/2014 0513    CMP Latest Ref Rng & Units 12/02/2021 12/02/2021 11/07/2021  Glucose 70 - 99 mg/dL 217(H) 198(H) 110(H)  BUN 8 - 23 mg/dL 62(H) 65(H) 19  Creatinine 0.44 - 1.00 mg/dL 2.37(H) 2.48(H) 1.76(H)  Sodium 135 - 145 mmol/L 133(L) 130(L) 136  Potassium 3.5 - 5.1 mmol/L 6.3(HH) 6.0(H) 2.8(L)  Chloride 98 - 111 mmol/L 106 106 101  CO2 22 - 32 mmol/L 16(L) 14(L) 23  Calcium 8.9 - 10.3 mg/dL 9.8 9.9 9.2   Total Protein 6.5 - 8.1 g/dL - 7.6 -  Total Bilirubin 0.3 - 1.2 mg/dL - 0.7 -  Alkaline Phos 38 - 126 U/L - 158(H) -  AST 15 - 41 U/L - 13(L) -  ALT 0 - 44 U/L - 9 -      Microbiology: Recent Results (from the past 240 hour(s))  Resp Panel by RT-PCR (Flu A&B, Covid) Nasopharyngeal Swab     Status: None   Collection Time: 12/02/21  7:48 AM   Specimen: Nasopharyngeal Swab; Nasopharyngeal(NP) swabs in vial transport medium  Result Value Ref Range Status   SARS Coronavirus 2 by RT PCR NEGATIVE NEGATIVE Final    Comment: (NOTE) SARS-CoV-2 target nucleic acids are NOT DETECTED.  The SARS-CoV-2 RNA is generally detectable in upper respiratory specimens during the acute phase of infection. The lowest concentration of SARS-CoV-2 viral copies this assay can detect is 138 copies/mL. A negative result does not preclude SARS-Cov-2 infection and should not be used as the sole basis for treatment or other patient management decisions. A negative result may occur with  improper specimen collection/handling, submission of specimen other than nasopharyngeal swab, presence of viral mutation(s) within the areas targeted by this assay, and inadequate number of viral copies(<138 copies/mL). A negative result must be combined with clinical observations, patient history, and epidemiological information. The expected result is Negative.  Fact Sheet for Patients:  EntrepreneurPulse.com.au  Fact Sheet for Healthcare Providers:  IncredibleEmployment.be  This test is no t yet approved or cleared by the [place] FDA and  has been authorized for detection and/or diagnosis of SARS-CoV-2 by FDA under an Emergency Use Authorization (EUA). This EUA will remain  in effect (meaning this test can be used) for the duration of the COVID-19 declaration under Section 564(b)(1) of the Act, 21 U.S.C.section 360bbb-3(b)(1), unless the authorization is terminated  or revoked  sooner.       Influenza A by PCR NEGATIVE NEGATIVE Final   Influenza B by PCR NEGATIVE NEGATIVE Final    Comment: (NOTE) The Xpert Xpress SARS-CoV-2/FLU/RSV plus assay is intended as an aid  in the diagnosis of influenza from Nasopharyngeal swab specimens and should not be used as a sole basis for treatment. Nasal washings and aspirates are unacceptable for Xpert Xpress SARS-CoV-2/FLU/RSV testing.  Fact Sheet for Patients: EntrepreneurPulse.com.au  Fact Sheet for Healthcare Providers: IncredibleEmployment.be  This test is not yet approved or cleared by the [place] FDA and has been authorized for detection and/or diagnosis of SARS-CoV-2 by FDA under an Emergency Use Authorization (EUA). This EUA will remain in effect (meaning this test can be used) for the duration of the COVID-19 declaration under Section 564(b)(1) of the Act, 21 U.S.C. section 360bbb-3(b)(1), unless the authorization is terminated or revoked.  Performed at [facility], [address] [place], [place] [address]     IMAGING RESULTS:  I have personally reviewed the films ? Impression/Recommendation ? Bilateral hydronephrosis.  With bilateral ureteral stents. Bladder distention complicated UTI Patient has a history of Candida glabrata pyelonephrosis and perirenal abscess on the left.  She had left PUJ stone The stents have not been in place since August 2022.  She needs urology consult and exchange of stent.  Sacral decubitus stage IV  AKI on CKD  needing dialysis in August 2022.  Hyperkalemia being corrected  Diabetes mellitus  History of perforated duodenum with surgery in August 2022. __________________________________________________ Discussed with patient, requesting provider Note:  This document was prepared using Dragon voice recognition software and may include unintentional dictation errors.

## 2021-12-03 ENCOUNTER — Other Ambulatory Visit: Payer: Self-pay

## 2021-12-03 ENCOUNTER — Inpatient Hospital Stay: Payer: Medicare Other

## 2021-12-03 LAB — COMPREHENSIVE METABOLIC PANEL
ALT: 7 U/L (ref 0–44)
AST: 11 U/L — ABNORMAL LOW (ref 15–41)
Albumin: 2.8 g/dL — ABNORMAL LOW (ref 3.5–5.0)
Alkaline Phosphatase: 134 U/L — ABNORMAL HIGH (ref 38–126)
Anion gap: 11 (ref 5–15)
BUN: 51 mg/dL — ABNORMAL HIGH (ref 8–23)
CO2: 15 mmol/L — ABNORMAL LOW (ref 22–32)
Calcium: 9.7 mg/dL (ref 8.9–10.3)
Chloride: 111 mmol/L (ref 98–111)
Creatinine, Ser: 2 mg/dL — ABNORMAL HIGH (ref 0.44–1.00)
GFR, Estimated: 26 mL/min — ABNORMAL LOW (ref >60)
Glucose, Bld: 170 mg/dL — ABNORMAL HIGH (ref 70–99)
Potassium: 5.1 mmol/L (ref 3.5–5.1)
Sodium: 137 mmol/L (ref 135–145)
Total Bilirubin: 0.7 mg/dL (ref 0.3–1.2)
Total Protein: 6.9 g/dL (ref 6.5–8.1)

## 2021-12-03 LAB — PHOSPHORUS: Phosphorus: 3.4 mg/dL (ref 2.5–4.6)

## 2021-12-03 LAB — GLUCOSE, CAPILLARY
Glucose-Capillary: 159 mg/dL — ABNORMAL HIGH (ref 70–99)
Glucose-Capillary: 166 mg/dL — ABNORMAL HIGH (ref 70–99)
Glucose-Capillary: 136 mg/dL — ABNORMAL HIGH (ref 70–99)
Glucose-Capillary: 120 mg/dL — ABNORMAL HIGH (ref 70–99)
Glucose-Capillary: 103 mg/dL — ABNORMAL HIGH (ref 70–99)

## 2021-12-03 LAB — CBC
HCT: 34.9 % — ABNORMAL LOW (ref 36.0–46.0)
Hemoglobin: 11.2 g/dL — ABNORMAL LOW (ref 12.0–15.0)
MCH: 26.3 pg (ref 26.0–34.0)
MCHC: 32.1 g/dL (ref 30.0–36.0)
MCV: 81.9 fL (ref 80.0–100.0)
Platelets: 284 10*3/uL (ref 150–400)
RBC: 4.26 MIL/uL (ref 3.87–5.11)
RDW: 17.2 % — ABNORMAL HIGH (ref 11.5–15.5)
WBC: 25.4 10*3/uL — ABNORMAL HIGH (ref 4.0–10.5)
nRBC: 0 % (ref 0.0–0.2)

## 2021-12-03 LAB — MAGNESIUM: Magnesium: 1.5 mg/dL — ABNORMAL LOW (ref 1.7–2.4)

## 2021-12-03 MED ORDER — SODIUM CHLORIDE 0.9% FLUSH: 10.0000 mL | INTRAVENOUS | Status: DC | PRN

## 2021-12-03 MED ORDER — STERILE WATER FOR INJECTION IV SOLN
INTRAVENOUS | Status: DC
  Filled 2021-12-03: qty 1000
  Filled 2021-12-03: qty 150
  Filled 2021-12-03 (×3): qty 1000

## 2021-12-03 MED ORDER — CLONIDINE HCL 0.1 MG PO TABS: 0.1000 mg | ORAL_TABLET | Freq: Two times a day (BID) | ORAL | Status: DC

## 2021-12-03 MED ORDER — METOPROLOL TARTRATE 5 MG/5ML IV SOLN
2.5000 mg | Freq: Once | INTRAVENOUS | Status: AC
  Administered 2021-12-03: 2.5 mg via INTRAVENOUS
  Filled 2021-12-03: qty 5

## 2021-12-03 MED ORDER — CLONIDINE HCL 0.1 MG PO TABS
0.1000 mg | ORAL_TABLET | Freq: Two times a day (BID) | ORAL | Status: DC
  Administered 2021-12-03 – 2021-12-12 (×17): 0.1 mg via ORAL
  Filled 2021-12-03 (×17): qty 1

## 2021-12-03 MED ORDER — SODIUM CHLORIDE 0.9 % IV SOLN
1.0000 g | INTRAVENOUS | Status: DC
  Administered 2021-12-04: 1 g via INTRAVENOUS
  Filled 2021-12-03: qty 10
  Filled 2021-12-03: qty 1

## 2021-12-03 NOTE — Progress Notes (Addendum)
PROGRESS NOTE    Heather Lopez  VZD:[id number] DOB: July 08, 1946 DOA: 12/02/2021 PCP: [name], PA-C    Brief Narrative:  Heather Lopez was admitted to the hospital with the working diagnosis of complicated urinary tract infection with bilateral hydronephrosis and acute renal failure.   76 yo female with the past medical history of HTN, dyslipidemia, T2DM, sp bilateral ureteral stents, left UPJ calculus, right hydronephrosis, pelvic abscess, candida urine infection, obesity and duodenal ulcer who presented with pelvic pain. Reported worsening lower abdominal pain, for the last several days, more intense over 24 hours leading into her hospitalization. Severe dull pain, colicky in nature and associated with nausea, reports passing renal stones.  She has a chronic indwelling foley cathter summer 2022 and chronic right upper extremity PICC line fall 2022.  On her initial physical examination her blood pressure was 91/72, HR 66, Temp 99, RR 25 and oxygen saturation 99%, lungs were clear to auscultation, heart with S1 and S2 present and rhythmic, abdomen soft and no lower extremity edema. Positive sacral decubitus ulcer stage IV, tender to palpation.   Sodium 130, potassium 6.0, chloride 106, bicarb 14, glucose 198, BUN 65, creatinine 2.48, white count 15.2, hemoglobin 12.3, hematocrit 39.3, platelets 314. SARS COVID-19 negative.  Urine analysis specific gravity 1.010, 100 protein, > 50 white cells, > 50 red cells.  CT of the abdomen with new bilateral hydronephrosis and hydroureter, right greater than left, despite bilateral double-J ureteral stents with adequate positioning.  Dilated urinary bladder.  Bilateral nephrolithiasis.  Chest radiograph no infiltrates.  EKG 74 bpm, normal axis, normal intervals, sinus rhythm with poor R wave progression, no significant ST segment T wave changes.  Patient was placed on IV fluids and broad spectrum antibiotic therapy. Foley cathter was changed with  improvement in renal function.   Assessment & Plan:   Principal Problem:   UTI (urinary tract infection) Active Problems:   Acute kidney injury superimposed on CKD (HCC)   Duodenal ulcer perforation (HCC)   Essential hypertension   Sacral decubitus ulcer   Acute osteomyelitis of coccyx (HCC)   Hyperkalemia   Hyponatremia   Hyperglycemia due to diabetes mellitus (HCC)   Elevated alkaline phosphatase level   Elevated lipase   Hypoalbuminemia due to protein-calorie malnutrition (HCC)   Obesity (BMI 30-39.9)   Severe sepsis due to complicated urinary tract infection (obstructive uropathy and nephrolithiasis), present on admission, end organ failure AKI/ acute metabolic encephalopathy.  Urine infection associated with chronic indwelling foley catheter. Patient continue to be somnolent but improved than yesterday. Wbc is 25,4, urine culture positive for Proteus Mirabilis 30,000 CFU. BLood cultures with no growth.  Foley cathter has been exchanged.  Continue IV fluids.   Plan to continue antibiotic therapy with IV ceftriaxone Follow up on cell count, cultures and temperature curve.   2. 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 Hyponatremia.  Renal function with serum cr down to 2,0, K is 5,1 and CL 111 with serum bicarbonate at 15, anion gap 11.  Foley cathter has been exchanged.  Plan to continue volume repletion with bicarbonate drip at 100 ml per hr, and follow up renal function and electrolytes in am.  Follow renal US, for improvement in hydronephrosis. Avoid hypotension and nephrotoxic medications.   Patient has ureteral stents, chronic nephrolithiasis, follow up with urology recommendations.   3. Stage IV sacrum pressure ulcer, chronic coccygeal osteomyelitis. Continue local wound care per surgery consultation. Plan to place wound  Vac.  Continue with IV vancomycin  4. T2DM/ dyslipidemia Continue  glucose cover and monitoring with insulin sliding scale.  Basal insulin with levemir 10 units bid Continue with satin therapy with atorvastatin.   5. HTN continue close blood pressure monitoring, holding on antihypertensive medications due to risk of hypotension.   6. Obesity class 2 calculated BMI is 35,9   7. Mild protein calorie malnutrition/ non ambulatory state. Will consul nutrition for further recommendations   Patient continue to be at high risk for worsening sepsis   Status is: Inpatient  Remains inpatient appropriate because: IV antibiotic therapy, IV fluids, urology evaluation.       DVT prophylaxis: Enoxaparin   Code Status:    full  Family Communication:   I spoke with patient's husband at the bedside, we talked in detail about patient's condition, plan of care and prognosis and all questions were addressed.      Consultants:  Urology  Surgery    Antimicrobials:  Ceftriaxone     Subjective:  Patient continue very weak and deconditioned, somnolent but easy to arouse, no nausea or vomiting, no dyspnea or chest pain.   Objective: Vitals:   12/02/21 2215 12/02/21 2250 12/03/21 0329 12/03/21 0808  BP:  (!) 170/56 (!) 188/65 (!) 163/69  Pulse: 91 81 87 97  Resp: (!) 24 (!) 22 18 18   Temp:  98.9 F (37.2 C) 97.8 F (36.6 C) (!) 97.4 F (36.3 C)  TempSrc:      SpO2: 98% 98% 100% 99%  Weight:  87.2 kg    Height:        Intake/Output Summary (Last 24 hours) at 12/03/2021 1410 Last data filed at 12/03/2021 0509 Gross per 24 hour  Intake 675.27 ml  Output 1925 ml  Net -1249.73 ml   Filed Weights   12/02/21 0402 12/02/21 2250  Weight: 92.1 kg 87.2 kg    Examination:   General: Not in pain or dyspnea, deconditioned and ill looking appearing  Neurology: somnolent but easy to arouse. Able to answer simple questions, chronic debility lower extremities.   E ENT: positive pallor, no icterus, oral mucosa moist Cardiovascular: No JVD. S1-S2 present,  rhythmic, no gallops, rubs, or murmurs. No lower extremity edema. Pulmonary: positive breath sounds bilaterally, adequate air movement, no wheezing, rhonchi or rales. Gastrointestinal. Abdomen soft and non tender Skin. Stage IV sacral decubitus ulcer Musculoskeletal: muscle wasting lower extremities.      Data Reviewed: I have personally reviewed following labs and imaging studies  CBC: Recent Labs  Lab 12/02/21 0408 12/03/21 0451  WBC 15.2* 25.4*  HGB 12.3 11.2*  HCT 39.3 34.9*  MCV 83.1 81.9  PLT 314 371   Basic Metabolic Panel: Recent Labs  Lab 12/02/21 0408 12/02/21 1442 12/02/21 2309 12/03/21 0451  NA 130* 133*  --  137  K 6.0* 6.3* 5.1 5.1  CL 106 106  --  111  CO2 14* 16*  --  15*  GLUCOSE 198* 217*  --  170*  BUN 65* 62*  --  51*  CREATININE 2.48* 2.37*  --  2.00*  CALCIUM 9.9 9.8  --  9.7  MG  --   --   --  1.5*  PHOS  --   --   --  3.4   GFR: Estimated Creatinine Clearance: 25.8 mL/min (A) (by C-G formula based on SCr of 2 mg/dL (H)). Liver Function Tests: Recent Labs  Lab 12/02/21 0408 12/03/21 0451  AST 13* 11*  ALT 9 7  ALKPHOS 158* 134*  BILITOT 0.7 0.7  PROT 7.6 6.9  ALBUMIN 3.1* 2.8*   Recent Labs  Lab 12/02/21 0408  LIPASE 107*   No results for input(s): AMMONIA in the last 168 hours. Coagulation Profile: No results for input(s): INR, PROTIME in the last 168 hours. Cardiac Enzymes: No results for input(s): CKTOTAL, CKMB, CKMBINDEX, TROPONINI in the last 168 hours. BNP (last 3 results) No results for input(s): PROBNP in the last 8760 hours. HbA1C: Recent Labs    12/02/21 1442  HGBA1C 5.9*   CBG: Recent Labs  Lab 12/02/21 1705 12/02/21 2151 12/02/21 2311 12/03/21 0905 12/03/21 1206  GLUCAP 213* 149* 155* 159* 166*   Lipid Profile: No results for input(s): CHOL, HDL, LDLCALC, TRIG, CHOLHDL, LDLDIRECT in the last 72 hours. Thyroid Function Tests: No results for input(s): TSH, T4TOTAL, FREET4, T3FREE, THYROIDAB in the  last 72 hours. Anemia Panel: No results for input(s): VITAMINB12, FOLATE, FERRITIN, TIBC, IRON, RETICCTPCT in the last 72 hours.    Radiology Studies: I have reviewed all of the imaging during this hospital visit personally     Scheduled Meds:  atorvastatin  20 mg Oral Daily   Chlorhexidine Gluconate Cloth  6 each Topical Daily   insulin aspart  0-15 Units Subcutaneous TID WC   insulin aspart  0-5 Units Subcutaneous QHS   insulin glargine-yfgn  10 Units Subcutaneous BID    morphine injection  4 mg Intravenous Once   vancomycin variable dose per unstable renal function (pharmacist dosing)   Does not apply See admin instructions   Continuous Infusions:  sodium chloride 125 mL/hr at 12/03/21 1246   ceFEPime (MAXIPIME) IV 2 g (12/03/21 1159)   fluconazole (DIFLUCAN) IV 400 mg (12/03/21 1159)     LOS: 1 day        Lynnita Somma Gerome Apley, MD

## 2021-12-03 NOTE — Evaluation (Addendum)
Physical Therapy Evaluation Patient Details Name: Heather Lopez MRN: [id number] DOB: 09-14-1946 Today's Date: 12/03/2021  History of Present Illness  Patient is a 76 year old female who presented to [facility] ED yesterday 12/02/21 with reports of lower abdominal pain and a fever. Patient was admitted with a UTI. Patient has a PMH (+) for hypertension, diabetes, hyperlipidemia, prior B/L ureteral stents, Left UPJ calculus, Right hydronephrosis, duodenal ulcer and obesity  Clinical Impression  Patient tolerated session fairly well and was agreeable to a limited evaluation. Patient reported no pain throughout the session however reported increased nausea. Per RN wound vac was just placed on patient. Due to wound vac placement patient refused performing any bed mobility or sitting tasks that would place increased weight-bearing through the wound vac due to discomfort. Agreed to strength and sensation testing. BUE and BLE sensation intact, grossly 3+/5 strength in BUE and LLE. RLE range of motion and strength limited assessment due to pain. Patient states she plans on returning to [facility] upon d/c from acute hospitalization prior to returning home. Patient would continue to benefit from skilled physical therapy in order to optimize patient return to PLOF. Recommend STR upon discharge.      Recommendations for follow up therapy are one component of a multi-disciplinary discharge planning process, led by the attending physician.  Recommendations may be updated based on patient status, additional functional criteria and insurance authorization.  Follow Up Recommendations Skilled nursing-short term rehab (<3 hours/day)    Assistance Recommended at Discharge Frequent or constant Supervision/Assistance  Patient can return home with the following  Assistance with cooking/housework;A lot of help with bathing/dressing/bathroom;A lot of help with walking and/or transfers;Two people to help with  bathing/dressing/bathroom;Direct supervision/assist for financial management;Direct supervision/assist for medications management;Assist for transportation;Help with stairs or ramp for entrance;Two people to help with walking and/or transfers    Equipment Recommendations Rolling walker (2 wheels)  Recommendations for Other Services       Functional Status Assessment Patient has had a recent decline in their functional status and demonstrates the ability to make significant improvements in function in a reasonable and predictable amount of time.     Precautions / Restrictions Precautions Precautions: Fall Restrictions Weight Bearing Restrictions: No      Mobility  Bed Mobility                    Transfers                        Ambulation/Gait                  Stairs            Wheelchair Mobility    Modified Rankin (Stroke Patients Only)       Balance                                             Pertinent Vitals/Pain Pain Assessment: No/denies pain Pain Location: No pain, however patient reports increased nausea Pain Intervention(s): Limited activity within patient's tolerance;Monitored during session;Repositioned;Premedicated before session    Home Living Family/patient expects to be discharged to:: Other (Comment) (Came from [facility] prior to admission, plans to return following discharge to finish therapy) Living Arrangements: Spouse/significant other Available Help at Discharge: Family;Available 24 hours/day Type of Home: House Home Access: Stairs to enter  Entrance Stairs-Number of Steps: 1 small threshold step Alternate Level Stairs-Number of Steps: 18 Home Layout: Multi-level;Able to live on main level with bedroom/bathroom Home Equipment: Kasandra Knudsen - single point;Wheelchair Probation officer (4 wheels);Shower seat Additional Comments: patient reports husband is in the process of putting up grab bars     Prior Function Prior Level of Function : Needs assist       Physical Assist : Mobility (physical)     Mobility Comments: Ambulated with bilateral canes and wheelchair (manual) ADLs Comments: Reports being Mod I/ I with bathing/ dressing/ cooking/ etc.     Hand Dominance   Dominant Hand: Right    Extremity/Trunk Assessment   Upper Extremity Assessment Upper Extremity Assessment: Generalized weakness (L grip strength > R grip strength, bilaterally 3+/5 grossly)    Lower Extremity Assessment Lower Extremity Assessment: Generalized weakness;RLE deficits/detail;LLE deficits/detail RLE Deficits / Details: Decreased global RLE range of motion due patient reporting she was supposed to get hip replaced, attempted SLR: unable RLE: Unable to fully assess due to pain RLE Sensation: WNL LLE Deficits / Details: LLE strength grossly 3+/5 LLE Sensation: WNL       Communication   Communication: No difficulties  Cognition Arousal/Alertness: Awake/alert Behavior During Therapy: WFL for tasks assessed/performed Overall Cognitive Status: Within Functional Limits for tasks assessed                                 General Comments: A&O x4 self, location, month/year, situation        General Comments      Exercises Other Exercises Other Exercises: patient was educated on role of PT   Assessment/Plan    PT Assessment Patient needs continued PT services  PT Problem List Decreased strength;Decreased mobility;Decreased range of motion;Decreased activity tolerance;Decreased knowledge of use of DME       PT Treatment Interventions Therapeutic activities;Therapeutic exercise;Patient/family education;Gait training;Stair training;Balance training;Functional mobility training;Neuromuscular re-education;DME instruction    PT Goals (Current goals can be found in the Care Plan section)  Acute Rehab PT Goals Patient Stated Goal: to get better and get home PT Goal Formulation:  With patient Time For Goal Achievement: 12/17/21 Potential to Achieve Goals: Good    Frequency Min 2X/week     Co-evaluation               AM-PAC PT "6 Clicks" Mobility  Outcome Measure Help needed turning from your back to your side while in a flat bed without using bedrails?: A Lot Help needed moving from lying on your back to sitting on the side of a flat bed without using bedrails?: A Lot Help needed moving to and from a bed to a chair (including a wheelchair)?: A Lot Help needed standing up from a chair using your arms (e.g., wheelchair or bedside chair)?: A Lot Help needed to walk in hospital room?: A Lot Help needed climbing 3-5 steps with a railing? : A Lot 6 Click Score: 12    End of Session Equipment Utilized During Treatment: Other (comment) (Sacral wound vac)   Patient left: in bed;with call bell/phone within reach;with bed alarm set;Other (comment) (Wound vac on) Nurse Communication: Mobility status PT Visit Diagnosis: Muscle weakness (generalized) (M62.81);Unsteadiness on feet (R26.81);Difficulty in walking, not elsewhere classified (R26.2);Repeated falls (R29.6)    Time: 0110-0126 PT Time Calculation (min) (ACUTE ONLY): 16 min   Charges:   PT Evaluation $PT Eval Low Complexity: 1 Low  Iva Boop, PT  12/03/21. 1:47 PM

## 2021-12-03 NOTE — Progress Notes (Addendum)
Subjective:  CC: Heather Lopez is a 76 y.o. female  Hospital stay day 1,   sacral wound  HPI: No acute issues with wound.  No complaint of pain.  ROS:  General: Denies weight loss, weight gain, fatigue, fevers, chills, and night sweats. Heart: Denies chest pain, palpitations, racing heart, irregular heartbeat, leg pain or swelling, and decreased activity tolerance. Respiratory: Denies breathing difficulty, shortness of breath, wheezing, cough, and sputum. GI: Denies change in appetite, heartburn, nausea, vomiting, constipation, diarrhea, and blood in stool. GU: Denies difficulty urinating, pain with urinating, urgency, frequency, blood in urine.   Objective:   Temp:  [97.4 F (36.3 C)-98.9 F (37.2 C)] 97.4 F (36.3 C) (01/08 0808) Pulse Rate:  [65-97] 97 (01/08 0808) Resp:  [15-30] 18 (01/08 0808) BP: (149-190)/(55-151) 163/69 (01/08 0808) SpO2:  [93 %-100 %] 99 % (01/08 0808) Weight:  [87.2 kg] 87.2 kg (01/07 2250)     Height: 5\' 3"  (160 cm) Weight: 87.2 kg BMI (Calculated): 34.07   Intake/Output this shift:   Intake/Output Summary (Last 24 hours) at 12/03/2021 1106 Last data filed at 12/03/2021 0509 Gross per 24 hour  Intake 1075.27 ml  Output 1925 ml  Net -849.73 ml    Constitutional :  alert, cooperative, appears stated age, and no distress  Respiratory:  clear to auscultation bilaterally  Cardiovascular:  regular rate and rhythm  Gastrointestinal: soft, non-tender; bowel sounds normal; no masses,  no organomegaly.   Skin: Cool and moist. Sacral wound dressing intact, no change from yesterday.  Wound measurement equals 3cm x 2.5cm x 2.5cm deep  Psychiatric: Normal affect, non-agitated, not confused       LABS:  CMP Latest Ref Rng & Units 12/03/2021 12/02/2021 12/02/2021  Glucose 70 - 99 mg/dL 170(H) - 217(H)  BUN 8 - 23 mg/dL 51(H) - 62(H)  Creatinine 0.44 - 1.00 mg/dL 2.00(H) - 2.37(H)  Sodium 135 - 145 mmol/L 137 - 133(L)  Potassium 3.5 - 5.1 mmol/L 5.1 5.1 6.3(HH)   Chloride 98 - 111 mmol/L 111 - 106  CO2 22 - 32 mmol/L 15(L) - 16(L)  Calcium 8.9 - 10.3 mg/dL 9.7 - 9.8  Total Protein 6.5 - 8.1 g/dL 6.9 - -  Total Bilirubin 0.3 - 1.2 mg/dL 0.7 - -  Alkaline Phos 38 - 126 U/L 134(H) - -  AST 15 - 41 U/L 11(L) - -  ALT 0 - 44 U/L 7 - -   CBC Latest Ref Rng & Units 12/03/2021 12/02/2021 11/21/2021  WBC 4.0 - 10.5 K/uL 25.4(H) 15.2(H) -  Hemoglobin 12.0 - 15.0 g/dL 11.2(L) 12.3 10.9(L)  Hematocrit 36.0 - 46.0 % 34.9(L) 39.3 36.6  Platelets 150 - 400 K/uL 284 314 -    RADS: N/a Assessment:   Sacral wound.  Remains clean.  DME Wound vac applied today at bedside by myself. Black sponge cut to fit entire wound cavity, secured with air tight seal and placed on 132mm Hg continuous suction.  No leak identified by machine.  Patient tolerated change well.  Next wound check will be wed to continue with MWF changes.  Further medical care per primary team

## 2021-12-03 NOTE — Progress Notes (Signed)
Chart review:  Patient renal function improving after foley change yesterday. Repeat renal US tomorrow to assess for interval resolution of hydronephrosis.

## 2021-12-03 NOTE — TOC Initial Note (Signed)
Transition of Care [facility]) - Initial/Assessment Note    Patient Details  Name: Heather Lopez MRN: [id number] Date of Birth: 10-24-1946  Transition of Care [facility]) CM/SW Contact:    Candie Chroman, LCSW Phone Number: 12/03/2021, 11:28 AM  Clinical Narrative:   CSW met with patient. No supports at bedside. CSW introduced role and explained that discharge planning would be discussed. Patient confirmed she was admitted from [facility] and plan is to return at discharge for further rehab. Patient discharged to [facility] on 9/8 and then [facility] on 10/11. Left message for admissions coordinator to confirm they can take her back at discharge. Told MD will need PT/OT consults whenever appropriate. No further concerns. CSW encouraged patient to contact CSW as needed. CSW will continue to follow patient for support and facilitate return to SNF once medically stable.               Expected Discharge Plan: Skilled Nursing Facility Barriers to Discharge: Continued Medical Work up   Patient Goals and CMS Choice     Choice offered to / list presented to : NA  Expected Discharge Plan and Services Expected Discharge Plan: [place] Acute Care Choice: Resumption of Svcs/PTA Provider Living arrangements for the past 2 months: [place]                                      Prior Living Arrangements/Services Living arrangements for the past 2 months: [place] Lives with:: Spouse Patient language and need for interpreter reviewed:: Yes Do you feel safe going back to the place where you live?: Yes      Need for Family Participation in Patient Care: Yes (Comment) Care giver support system in place?: Yes (comment)   Criminal Activity/Legal Involvement Pertinent to Current Situation/Hospitalization: No - Comment as needed  Activities of Daily Living Home Assistive Devices/Equipment: Grab bars in shower, Wheelchair, Environmental consultant  (specify type), Shower chair with back ADL Screening (condition at time of admission) Patient's cognitive ability adequate to safely complete daily activities?: Yes Is the patient deaf or have difficulty hearing?: No Does the patient have difficulty seeing, even when wearing glasses/contacts?: Yes Does the patient have difficulty concentrating, remembering, or making decisions?: No Patient able to express need for assistance with ADLs?: Yes Does the patient have difficulty dressing or bathing?: Yes Independently performs ADLs?: No Communication: Independent Dressing (OT): Needs assistance Is this a change from baseline?: Pre-admission baseline Grooming: Needs assistance Is this a change from baseline?: Pre-admission baseline Feeding: Independent Bathing: Needs assistance Is this a change from baseline?: Pre-admission baseline Toileting: Needs assistance Is this a change from baseline?: Pre-admission baseline In/Out Bed: Needs assistance Is this a change from baseline?: Pre-admission baseline Walks in Home: Needs assistance Is this a change from baseline?: Pre-admission baseline Does the patient have difficulty walking or climbing stairs?: Yes Weakness of Legs: Both Weakness of Arms/Hands: Both  Permission Sought/Granted Permission sought to share information with : Facility Art therapist granted to share information with : Yes, Verbal Permission Granted     Permission granted to share info w AGENCY: [facility]        Emotional Assessment Appearance:: Appears stated age Attitude/Demeanor/Rapport: Engaged, Gracious Affect (typically observed): Accepting, Appropriate, Calm, Pleasant Orientation: : Oriented to Self, Oriented to Place, Oriented to  Time, Oriented to Situation Alcohol /  Substance Use: Not Applicable Psych Involvement: No (comment)  Admission diagnosis:  Hyperkalemia [E87.5] UTI (urinary tract infection) [N39.0] Acute UTI [N39.0] AKI  (acute kidney injury) ([place]) [N17.9] Sacral decubitus ulcer, stage IV ([place]) [L89.154] Acute osteomyelitis of coccyx ([place]) [M46.28] Patient Active Problem List   Diagnosis Date Noted   UTI (urinary tract infection) 12/02/2021   Sacral decubitus ulcer 12/02/2021   Acute osteomyelitis of coccyx ([place]) 12/02/2021   Abdominal pain 12/02/2021   Nausea & vomiting 12/02/2021   Hyperkalemia 12/02/2021   Hyponatremia 12/02/2021   Hyperglycemia due to diabetes mellitus ([place]) 12/02/2021   Elevated alkaline phosphatase level 12/02/2021   Elevated lipase 12/02/2021   Hypoalbuminemia due to protein-calorie malnutrition ([place]) 12/02/2021   Obesity (BMI 30-39.9) 12/02/2021   Anxiety 10/10/2021   Primary localized osteoarthritis of pelvic region and thigh 10/10/2021   Benign hypertensive kidney disease with chronic kidney disease 09/26/2021   Secondary hyperparathyroidism of renal origin ([place]) 09/26/2021   Hypokalemia 07/20/2021   Renal abscess    Essential hypertension    Lethargy    Duodenal ulcer perforation ([place])    Demand ischemia of myocardium ([place])    Atrial flutter with rapid ventricular response ([place]) 07/07/2021   Endotracheally intubated 07/07/2021   Bilateral nephrolithiasis 07/07/2021   Symptomatic anemia 07/07/2021   Pressure injury of skin 06/30/2021   Pneumoperitoneum    Acute respiratory failure with hypoxia ([place]) 06/27/2021   Severe sepsis with septic shock ([place]) 06/27/2021   Acute lower UTI 06/27/2021   Acute kidney injury superimposed on CKD ([place]) 06/27/2021   Type 2 diabetes mellitus with stage 4 chronic kidney disease, with long-term current use of insulin ([place]) 03/03/2018   Vitamin D deficiency 08/21/2016   IBS (irritable bowel syndrome) 08/21/2016   Special screening for malignant neoplasms, colon    Benign neoplasm of transverse colon    Benign neoplasm of sigmoid colon    Uncontrolled type 2 diabetes mellitus with insulin therapy ([place]) 07/12/2015   Benign hypertension  with CKD (chronic kidney disease) stage III ([place]) 07/12/2015   Hyperlipidemia associated with type 2 diabetes mellitus ([place]) 07/12/2015   Depression 07/12/2015   BMI 40.0-44.9, adult ([place]) 07/12/2015   PCP:  [name], PA-C Pharmacy:   [facility] - [place], [place] - [address] [place] [address] Phone: [phone] Fax: [phone]  [facility] [place], [place] [address] [place] [address] Phone: [phone] Fax: [phone]  [id number] - [place], [place] - [address] [place] [address] Phone: [phone] Fax: [phone]     Social Determinants of Health (SDOH) Interventions    Readmission Risk Interventions Readmission Risk Prevention Plan 12/03/2021  Transportation Screening Complete  Medication Review ([place]) Complete  PCP or Specialist appointment within 3-5 days of discharge Complete  SW Recovery Care/Counseling Consult Complete  Palliative Care Screening Not Applicable  Skilled Nursing Facility Complete  Some recent data might be hidden

## 2021-12-04 ENCOUNTER — Telehealth: Payer: Self-pay | Admitting: Internal Medicine

## 2021-12-04 DIAGNOSIS — N390 Urinary tract infection, site not specified: Secondary | ICD-10-CM | POA: Diagnosis not present

## 2021-12-04 LAB — CBC
HCT: 30.9 % — ABNORMAL LOW (ref 36.0–46.0)
Hemoglobin: 9.7 g/dL — ABNORMAL LOW (ref 12.0–15.0)
MCH: 25.5 pg — ABNORMAL LOW (ref 26.0–34.0)
MCHC: 31.4 g/dL (ref 30.0–36.0)
MCV: 81.3 fL (ref 80.0–100.0)
Platelets: 215 10*3/uL (ref 150–400)
RBC: 3.8 MIL/uL — ABNORMAL LOW (ref 3.87–5.11)
RDW: 17.4 % — ABNORMAL HIGH (ref 11.5–15.5)
WBC: 11.6 10*3/uL — ABNORMAL HIGH (ref 4.0–10.5)
nRBC: 0 % (ref 0.0–0.2)

## 2021-12-04 LAB — GLUCOSE, CAPILLARY
Glucose-Capillary: 100 mg/dL — ABNORMAL HIGH (ref 70–99)
Glucose-Capillary: 106 mg/dL — ABNORMAL HIGH (ref 70–99)
Glucose-Capillary: 134 mg/dL — ABNORMAL HIGH (ref 70–99)
Glucose-Capillary: 102 mg/dL — ABNORMAL HIGH (ref 70–99)

## 2021-12-04 LAB — SURGICAL PCR SCREEN
MRSA, PCR: NEGATIVE
Staphylococcus aureus: NEGATIVE

## 2021-12-04 LAB — URINE CULTURE: Culture: 30000 — AB

## 2021-12-04 LAB — BASIC METABOLIC PANEL
Anion gap: 7 (ref 5–15)
BUN: 43 mg/dL — ABNORMAL HIGH (ref 8–23)
CO2: 22 mmol/L (ref 22–32)
Calcium: 9.2 mg/dL (ref 8.9–10.3)
Chloride: 109 mmol/L (ref 98–111)
Creatinine, Ser: 1.64 mg/dL — ABNORMAL HIGH (ref 0.44–1.00)
GFR, Estimated: 33 mL/min — ABNORMAL LOW (ref >60)
Glucose, Bld: 107 mg/dL — ABNORMAL HIGH (ref 70–99)
Potassium: 3.8 mmol/L (ref 3.5–5.1)
Sodium: 138 mmol/L (ref 135–145)

## 2021-12-04 MED ORDER — SODIUM CHLORIDE 0.9 % IV SOLN
2.0000 g | INTRAVENOUS | Status: DC
  Filled 2021-12-04 (×2): qty 20

## 2021-12-04 MED ORDER — BUSPIRONE HCL 10 MG PO TABS
5.0000 mg | ORAL_TABLET | Freq: Every day | ORAL | Status: DC
  Administered 2021-12-04 – 2021-12-12 (×8): 5 mg via ORAL
  Filled 2021-12-04 (×8): qty 1

## 2021-12-04 MED ORDER — SENNOSIDES-DOCUSATE SODIUM 8.6-50 MG PO TABS
1.0000 | ORAL_TABLET | Freq: Two times a day (BID) | ORAL | Status: DC
  Administered 2021-12-04 – 2021-12-12 (×7): 1 via ORAL
  Filled 2021-12-04 (×13): qty 1

## 2021-12-04 MED ORDER — HYDRALAZINE HCL 20 MG/ML IJ SOLN
10.0000 mg | INTRAMUSCULAR | Status: DC | PRN
  Administered 2021-12-11: 10 mg via INTRAVENOUS
  Filled 2021-12-04: qty 1

## 2021-12-04 MED ORDER — LACTATED RINGERS IV SOLN: INTRAVENOUS | Status: DC

## 2021-12-04 MED ORDER — METOPROLOL TARTRATE 50 MG PO TABS
100.0000 mg | ORAL_TABLET | Freq: Two times a day (BID) | ORAL | Status: DC
  Administered 2021-12-04 – 2021-12-12 (×17): 100 mg via ORAL
  Filled 2021-12-04 (×17): qty 2

## 2021-12-04 MED ORDER — ADULT MULTIVITAMIN W/MINERALS CH
1.0000 | ORAL_TABLET | Freq: Every day | ORAL | Status: DC
  Administered 2021-12-05 – 2021-12-12 (×7): 1 via ORAL
  Filled 2021-12-04 (×7): qty 1

## 2021-12-04 MED ORDER — VANCOMYCIN HCL 1250 MG/250ML IV SOLN
1250.0000 mg | INTRAVENOUS | Status: DC
  Administered 2021-12-04: 1250 mg via INTRAVENOUS
  Filled 2021-12-04: qty 250

## 2021-12-04 MED ORDER — BUPROPION HCL 100 MG PO TABS
100.0000 mg | ORAL_TABLET | Freq: Every day | ORAL | Status: DC
  Administered 2021-12-04 – 2021-12-12 (×8): 100 mg via ORAL
  Filled 2021-12-04 (×10): qty 1

## 2021-12-04 MED ORDER — FLUTICASONE PROPIONATE 50 MCG/ACT NA SUSP
1.0000 | Freq: Every day | NASAL | Status: DC
  Administered 2021-12-05 – 2021-12-09 (×3): 1 via NASAL
  Filled 2021-12-04: qty 16

## 2021-12-04 MED ORDER — PANTOPRAZOLE SODIUM 40 MG PO TBEC
40.0000 mg | DELAYED_RELEASE_TABLET | Freq: Every day | ORAL | Status: DC
  Administered 2021-12-04 – 2021-12-12 (×8): 40 mg via ORAL
  Filled 2021-12-04 (×8): qty 1

## 2021-12-04 MED ORDER — NYSTATIN 100000 UNIT/GM EX POWD
1.0000 "application " | Freq: Two times a day (BID) | CUTANEOUS | Status: DC
  Administered 2021-12-04 – 2021-12-12 (×12): 1 via TOPICAL
  Filled 2021-12-04 (×2): qty 15

## 2021-12-04 MED ORDER — OXYCODONE-ACETAMINOPHEN 5-325 MG PO TABS
1.0000 | ORAL_TABLET | Freq: Four times a day (QID) | ORAL | Status: DC | PRN
  Administered 2021-12-06 – 2021-12-11 (×8): 1 via ORAL
  Filled 2021-12-04 (×8): qty 1

## 2021-12-04 MED ORDER — ENSURE ENLIVE PO LIQD
237.0000 mL | Freq: Three times a day (TID) | ORAL | Status: DC
  Administered 2021-12-04 – 2021-12-05 (×5): 237 mL via ORAL

## 2021-12-04 MED ORDER — GUAIFENESIN 100 MG/5ML PO LIQD
10.0000 mL | Freq: Four times a day (QID) | ORAL | Status: DC | PRN
  Filled 2021-12-04: qty 10

## 2021-12-04 MED ORDER — MIRTAZAPINE 15 MG PO TABS
15.0000 mg | ORAL_TABLET | Freq: Every day | ORAL | Status: DC
  Administered 2021-12-04 – 2021-12-11 (×8): 15 mg via ORAL
  Filled 2021-12-04 (×8): qty 1

## 2021-12-04 MED ORDER — ENOXAPARIN SODIUM 40 MG/0.4ML IJ SOSY
40.0000 mg | PREFILLED_SYRINGE | INTRAMUSCULAR | Status: DC
  Administered 2021-12-04 – 2021-12-05 (×2): 40 mg via SUBCUTANEOUS
  Filled 2021-12-04 (×3): qty 0.4

## 2021-12-04 NOTE — NC FL2 (Signed)
[place] LEVEL OF CARE SCREENING TOOL     IDENTIFICATION  Patient Name: Heather Lopez Birthdate: 06-Mar-1946 Sex: female Admission Date (Current Location): 12/02/2021  [facility] and [place] Number:  Engineering geologist and Address:  [facility], [address], [place], [place] [address]      Provider Number: [id number]  Attending Physician Name and Address:  Sidney Ace, MD  Relative Name and Phone Number:  Elon Spanner, [phone]    Current Level of Care: Hospital Recommended Level of Care: [place] Prior Approval Number:    Date Approved/Denied:   PASRR Number: [id number]  Discharge Plan: SNF    Current Diagnoses: Patient Active Problem List   Diagnosis Date Noted   UTI (urinary tract infection) 12/02/2021   Sacral decubitus ulcer 12/02/2021   Acute osteomyelitis of coccyx ([place]) 12/02/2021   Abdominal pain 12/02/2021   Nausea & vomiting 12/02/2021   Hyperkalemia 12/02/2021   Hyponatremia 12/02/2021   Hyperglycemia due to diabetes mellitus ([place]) 12/02/2021   Elevated alkaline phosphatase level 12/02/2021   Elevated lipase 12/02/2021   Hypoalbuminemia due to protein-calorie malnutrition ([place]) 12/02/2021   Obesity (BMI 30-39.9) 12/02/2021   Anxiety 10/10/2021   Primary localized osteoarthritis of pelvic region and thigh 10/10/2021   Benign hypertensive kidney disease with chronic kidney disease 09/26/2021   Secondary hyperparathyroidism of renal origin ([place]) 09/26/2021   Hypokalemia 07/20/2021   Renal abscess    Essential hypertension    Lethargy    Duodenal ulcer perforation (HCC)    Demand ischemia of myocardium (HCC)    Atrial flutter with rapid ventricular response ([place]) 07/07/2021   Endotracheally intubated 07/07/2021   Bilateral nephrolithiasis 07/07/2021   Symptomatic anemia 07/07/2021   Pressure injury of skin 06/30/2021   Pneumoperitoneum    Acute respiratory failure with hypoxia ([place])  06/27/2021   Severe sepsis with septic shock ([place]) 06/27/2021   Acute lower UTI 06/27/2021   Acute kidney injury superimposed on CKD ([place]) 06/27/2021   Type 2 diabetes mellitus with stage 4 chronic kidney disease, with long-term current use of insulin ([place]) 03/03/2018   Vitamin D deficiency 08/21/2016   IBS (irritable bowel syndrome) 08/21/2016   Special screening for malignant neoplasms, colon    Benign neoplasm of transverse colon    Benign neoplasm of sigmoid colon    Uncontrolled type 2 diabetes mellitus with insulin therapy ([place]) 07/12/2015   Benign hypertension with CKD (chronic kidney disease) stage III ([place]) 07/12/2015   Hyperlipidemia associated with type 2 diabetes mellitus ([place]) 07/12/2015   Depression 07/12/2015   BMI 40.0-44.9, adult ([place]) 07/12/2015    Orientation RESPIRATION BLADDER Height & Weight     Self, Time, Situation, Place  Normal Continent Weight: 87.2 kg Height:  5\' 3"  (160 cm)  BEHAVIORAL SYMPTOMS/MOOD NEUROLOGICAL BOWEL NUTRITION STATUS      Continent Diet (heart healthy carb modified)  AMBULATORY STATUS COMMUNICATION OF NEEDS Skin   Extensive Assist Verbally Wound Vac (MWF dressing change)                       Personal Care Assistance Level of Assistance  Bathing, Dressing, Feeding Bathing Assistance: Limited assistance Feeding assistance: Independent Dressing Assistance: Limited assistance     Functional Limitations Info             SPECIAL CARE FACTORS FREQUENCY  PT (By licensed PT), OT (By licensed OT)     PT Frequency: 5 days a week OT Frequency: 5  days a week            Contractures Contractures Info: Not present    Additional Factors Info  Code Status, Allergies Code Status Info: DNR Allergies Info: Contrast Media  (Iodinated Contrast Media), Iodine           Current Medications (12/04/2021):  This is the current hospital active medication list Current Facility-Administered Medications  Medication Dose Route  Frequency Provider Last Rate Last Admin   atorvastatin (LIPITOR) tablet 20 mg  20 mg Oral Daily Adefeso, Oladapo, DO   20 mg at 12/04/21 0847   cefTRIAXone (ROCEPHIN) 1 g in sodium chloride 0.9 % 100 mL IVPB  1 g Intravenous Q24H Arrien, Jimmy Picket, MD       Chlorhexidine Gluconate Cloth 2 % PADS 6 each  6 each Topical Daily Adefeso, Oladapo, DO   6 each at 12/03/21 0912   cloNIDine (CATAPRES) tablet 0.1 mg  0.1 mg Oral BID Arrien, Jimmy Picket, MD   0.1 mg at 12/04/21 0847   insulin aspart (novoLOG) injection 0-15 Units  0-15 Units Subcutaneous TID WC Adefeso, Oladapo, DO   2 Units at 12/03/21 1807   insulin aspart (novoLOG) injection 0-5 Units  0-5 Units Subcutaneous QHS Adefeso, Oladapo, DO       insulin glargine-yfgn (SEMGLEE) injection 10 Units  10 Units Subcutaneous BID Adefeso, Oladapo, DO   10 Units at 12/03/21 2131   metoprolol tartrate (LOPRESSOR) tablet 100 mg  100 mg Oral BID Sreenath, Sudheer B, MD       morphine 4 MG/ML injection 4 mg  4 mg Intravenous Once Adefeso, Oladapo, DO       ondansetron (ZOFRAN) injection 4 mg  4 mg Intravenous Q6H PRN Adefeso, Oladapo, DO   4 mg at 12/03/21 1249   oxyCODONE-acetaminophen (PERCOCET/ROXICET) 5-325 MG per tablet 1 tablet  1 tablet Oral Q30 min PRN Adefeso, Oladapo, DO   1 tablet at 12/02/21 0411   prochlorperazine (COMPAZINE) injection 10 mg  10 mg Intravenous Q6H PRN Adefeso, Oladapo, DO   10 mg at 12/03/21 1714   sodium bicarbonate 150 mEq in sterile water 1,150 mL infusion   Intravenous Continuous Arrien, Jimmy Picket, MD 100 mL/hr at 12/04/21 0517 New Bag at 12/04/21 0517   sodium chloride flush (NS) 0.9 % injection 10-40 mL  10-40 mL Intracatheter PRN Arrien, Jimmy Picket, MD       vancomycin (VANCOREADY) IVPB 1250 mg/250 mL  1,250 mg Intravenous Q48H [place], [facility]         Discharge Medications: Please see discharge summary for a list of discharge medications.  Relevant Imaging Results:  Relevant Lab  Results:   Additional Information SS# [id number]  Conception Oms, RN

## 2021-12-04 NOTE — TOC Progression Note (Signed)
Transition of Care [facility]) - Progression Note    Patient Details  Name: Heather Lopez MRN: [id number] Date of Birth: 11-05-1946  Transition [facility]) CM/SW [place], RN Phone Number: 12/04/2021, 3:16 PM  Clinical Narrative:   [facility], awaiting a response    Expected Discharge Plan: [place] Barriers to Discharge: Continued Medical Work up  Expected Discharge Plan and Services Expected Discharge Plan: [place] Acute Care Choice: Resumption of Svcs/PTA Provider Living arrangements for the past 2 months: [place]                                       Social Determinants of Health (SDOH) Interventions    Readmission Risk Interventions Readmission Risk Prevention Plan 12/03/2021  Transportation Screening Complete  Medication Review Press photographer) Complete  PCP or Specialist appointment within 3-5 days of discharge Complete  SW Recovery Care/Counseling Consult Complete  [place] Complete  Some recent data might be hidden

## 2021-12-04 NOTE — Progress Notes (Signed)
PROGRESS NOTE    Heather Lopez  HCW:[id number] DOB: [birth year] DOA: 12/02/2021 PCP: [name], PA-C    Brief Narrative:  [name] was admitted to the hospital with the working diagnosis of complicated urinary tract infection with bilateral hydronephrosis and acute renal failure.    76 yo female with the past medical history of HTN, dyslipidemia, T2DM, sp bilateral ureteral stents, left UPJ calculus, right hydronephrosis, pelvic abscess, candida urine infection, obesity and duodenal ulcer who presented with pelvic pain. Reported worsening lower abdominal pain, for the last several days, more intense over 24 hours leading into her hospitalization. Severe dull pain, colicky in nature and associated with nausea, reports passing renal stones.  She has a chronic indwelling foley cathter summer 2022 and chronic right upper extremity PICC line fall 2022.   Urology and general surgery are involved.  Urology exchanged Foley.  Initial bilateral hydronephrosis seen on imaging has completely resolved on follow-up renal ultrasound.  General surgery engaged for evaluation of sacral wound.  Wound VAC applied at bedside.  Per last general surgery note wound checks with dressing change Monday Wednesday Friday.    Assessment & Plan:   Principal Problem:   UTI (urinary tract infection) Active Problems:   Acute kidney injury superimposed on CKD (HCC)   Duodenal ulcer perforation (HCC)   Essential hypertension   Sacral decubitus ulcer   Acute osteomyelitis of coccyx (HCC)   Hyperkalemia   Hyponatremia   Hyperglycemia due to diabetes mellitus (HCC)   Elevated alkaline phosphatase level   Elevated lipase   Hypoalbuminemia due to protein-calorie malnutrition (HCC)   Obesity (BMI 30-39.9)  Severe sepsis due to complicated urinary tract infection (obstructive uropathy and nephrolithiasis), present on admission, end organ failure AKI/ acute metabolic encephalopathy.  Urine infection associated with  chronic indwelling foley catheter. Patient remains somnolent Per husband at bedside she woke up eat breakfast and fell back asleep Leukocytosis improving Urine culture positive for Proteus mirabilis Blood cultures no growth to date Foley catheter has been exchanged, appreciate urology consultation Follow-up renal ultrasound shows resolution of hydronephrosis Plan: Continue Foley catheter Continue IV fluids Continue IV antibiotics Monitor vitals and fever curve   AKI on CKD sage 3b, obstructive uropathy, bilateral hydronephrosis, hyperkalemia, non anion gap metabolic acidosis. Hyponatremia.  Foley catheter has been exchanged. Improvement in renal function and metabolic parameters Plan: DC bicarbonate infusion Start LR at 75 cc/h Avoid hypotension and nephrotoxins Follow-up any further urology recommendations  Stage IV sacrum pressure ulcer, chronic coccygeal osteomyelitis.  Continue local wound care per surgery consultation. Wound VAC in place Plan: Continue wound VAC General surgery follow-up, wound checks MWF Check surgical MRSA screen Continue IV vancomycin for now If MRSA screen negative can DC vancomycin and continue with ceftriaxone monotherapy   T2DM/ dyslipidemia Continue glucose cover and monitoring with insulin sliding scale.  Basal insulin with levemir 10 units bid Continue with satin therapy with atorvastatin.    HTN  Restarting home regimen Metoprolol Clonidine As needed IV hydralazine   Obesity class 2  calculated BMI is 35,9    Mild protein calorie malnutrition/ non ambulatory state.  Will consult nutrition for further recommendations    DVT prophylaxis: SQ Lovenox Code Status: DNR Family Communication: Husband at bedside 1/9 Disposition Plan: Status is: Inpatient  Remains inpatient appropriate because: Severe sepsis, AKI secondary to obstructive uropathy and UTI.  Renal parameters improving.  Anticipated date of discharge on  1/11       Level of care: Med-Surg  Consultants:  Urology General surgery  Procedures:  Foley catheter Wound VAC  Antimicrobials: Ceftriaxone Vancomycin   Subjective: Seen and examined.  Patient sleeping.  Husband at bedside.  Patient visibly no distress.  Did endorse some lower abdominal pain.  Improving  Objective: Vitals:   12/04/21 0042 12/04/21 0334 12/04/21 0837 12/04/21 1217  BP: (!) 183/68 (!) 171/63 (!) 182/87 137/85  Pulse: 98 83 100 75  Resp:   17 18  Temp:  98.6 F (37 C) 97.8 F (36.6 C) 98 F (36.7 C)  TempSrc:   Oral Oral  SpO2:  96% 100% 99%  Weight:      Height:        Intake/Output Summary (Last 24 hours) at 12/04/2021 1442 Last data filed at 12/04/2021 1415 Gross per 24 hour  Intake 2298.6 ml  Output 2600 ml  Net -301.4 ml   Filed Weights   12/02/21 0402 12/02/21 2250  Weight: 92.1 kg 87.2 kg    Examination:  General exam: No acute distress.  Appears deconditioned Respiratory system: Bibasilar crackles.  Normal work of breathing.  Room air Cardiovascular system: S1-S2, RRR, no murmurs, no pedal edema Gastrointestinal system: Soft, nondistended, tender to palpation lower abdomen, normal bowel sounds Central nervous system: Alert and oriented. No focal neurological deficits. Extremities: Symmetric 5 x 5 power. Skin: No rashes, lesions or ulcers Psychiatry: Judgement and insight appear normal. Mood & affect appropriate.     Data Reviewed: I have personally reviewed following labs and imaging studies  CBC: Recent Labs  Lab 12/02/21 0408 12/03/21 0451 12/04/21 0500  WBC 15.2* 25.4* 11.6*  HGB 12.3 11.2* 9.7*  HCT 39.3 34.9* 30.9*  MCV 83.1 81.9 81.3  PLT 314 284 993   Basic Metabolic Panel: Recent Labs  Lab 12/02/21 0408 12/02/21 1442 12/02/21 2309 12/03/21 0451 12/04/21 0500  NA 130* 133*  --  137 138  K 6.0* 6.3* 5.1 5.1 3.8  CL 106 106  --  111 109  CO2 14* 16*  --  15* 22  GLUCOSE 198* 217*  --  170* 107*  BUN  65* 62*  --  51* 43*  CREATININE 2.48* 2.37*  --  2.00* 1.64*  CALCIUM 9.9 9.8  --  9.7 9.2  MG  --   --   --  1.5*  --   PHOS  --   --   --  3.4  --    GFR: Estimated Creatinine Clearance: 31.5 mL/min (A) (by C-G formula based on SCr of 1.64 mg/dL (H)). Liver Function Tests: Recent Labs  Lab 12/02/21 0408 12/03/21 0451  AST 13* 11*  ALT 9 7  ALKPHOS 158* 134*  BILITOT 0.7 0.7  PROT 7.6 6.9  ALBUMIN 3.1* 2.8*   Recent Labs  Lab 12/02/21 0408  LIPASE 107*   No results for input(s): AMMONIA in the last 168 hours. Coagulation Profile: No results for input(s): INR, PROTIME in the last 168 hours. Cardiac Enzymes: No results for input(s): CKTOTAL, CKMB, CKMBINDEX, TROPONINI in the last 168 hours. BNP (last 3 results) No results for input(s): PROBNP in the last 8760 hours. HbA1C: Recent Labs    12/02/21 1442  HGBA1C 5.9*   CBG: Recent Labs  Lab 12/03/21 1802 12/03/21 1953 12/03/21 2128 12/04/21 0812 12/04/21 1214  GLUCAP 136* 120* 103* 100* 106*   Lipid Profile: No results for input(s): CHOL, HDL, LDLCALC, TRIG, CHOLHDL, LDLDIRECT in the last 72 hours. Thyroid Function Tests: No results for input(s): TSH, T4TOTAL, FREET4, T3FREE, THYROIDAB in the  last 72 hours. Anemia Panel: No results for input(s): VITAMINB12, FOLATE, FERRITIN, TIBC, IRON, RETICCTPCT in the last 72 hours. Sepsis Labs: Recent Labs  Lab 12/02/21 0408  PROCALCITON 0.27  LATICACIDVEN 1.4    Recent Results (from the past 240 hour(s))  Culture, blood (Routine X 2) w Reflex to ID Panel     Status: None (Preliminary result)   Collection Time: 12/02/21  6:09 AM   Specimen: BLOOD  Result Value Ref Range Status   Specimen Description BLOOD RIGHT ANTECUBITAL  Final   Special Requests   Final    BOTTLES DRAWN AEROBIC AND ANAEROBIC Blood Culture adequate volume   Culture   Final    NO GROWTH 2 DAYS Performed at [facility], [address] [place], [place] [address]    Report Status  PENDING  Incomplete  Urine Culture     Status: Abnormal   Collection Time: 12/02/21  6:09 AM   Specimen: Urine, Catheterized  Result Value Ref Range Status   Specimen Description   Final    URINE, CATHETERIZED Performed at [facility], [address], [place], [place] [address]    Special Requests   Final    NONE Performed at [facility], [address], [place], [place] [address]    Culture 30,000 COLONIES/mL PROTEUS MIRABILIS (A)  Final   Report Status 12/04/2021 FINAL  Final   Organism ID, Bacteria PROTEUS MIRABILIS (A)  Final      Susceptibility   Proteus mirabilis - MIC*    AMPICILLIN <=2 SENSITIVE Sensitive     CEFAZOLIN 8 SENSITIVE Sensitive     CEFEPIME <=0.12 SENSITIVE Sensitive     CEFTRIAXONE <=0.25 SENSITIVE Sensitive     CIPROFLOXACIN 1 RESISTANT Resistant     GENTAMICIN <=1 SENSITIVE Sensitive     IMIPENEM 4 SENSITIVE Sensitive     NITROFURANTOIN 128 RESISTANT Resistant     TRIMETH/SULFA <=20 SENSITIVE Sensitive     AMPICILLIN/SULBACTAM <=2 SENSITIVE Sensitive     PIP/TAZO <=4 SENSITIVE Sensitive     * 30,000 COLONIES/mL PROTEUS MIRABILIS  Resp Panel by RT-PCR (Flu A&B, Covid) Nasopharyngeal Swab     Status: None   Collection Time: 12/02/21  7:48 AM   Specimen: Nasopharyngeal Swab; Nasopharyngeal(NP) swabs in vial transport medium  Result Value Ref Range Status   SARS Coronavirus 2 by RT PCR NEGATIVE NEGATIVE Final    Comment: (NOTE) SARS-CoV-2 target nucleic acids are NOT DETECTED.  The SARS-CoV-2 RNA is generally detectable in upper respiratory specimens during the acute phase of infection. The lowest concentration of SARS-CoV-2 viral copies this assay can detect is 138 copies/mL. A negative result does not preclude SARS-Cov-2 infection and should not be used as the sole basis for treatment or other patient management decisions. A negative result may occur with  improper specimen collection/handling, submission of specimen other than  nasopharyngeal swab, presence of viral mutation(s) within the areas targeted by this assay, and inadequate number of viral copies(<138 copies/mL). A negative result must be combined with clinical observations, patient history, and epidemiological information. The expected result is Negative.  Fact Sheet for Patients:  EntrepreneurPulse.com.au  Fact Sheet for Healthcare Providers:  IncredibleEmployment.be  This test is no t yet approved or cleared by the [place] FDA and  has been authorized for detection and/or diagnosis of SARS-CoV-2 by FDA under an Emergency Use Authorization (EUA). This EUA will remain  in effect (meaning this test can be used) for the duration of the COVID-19 declaration under  Section 564(b)(1) of the Act, 21 U.S.C.section 360bbb-3(b)(1), unless the authorization is terminated  or revoked sooner.       Influenza A by PCR NEGATIVE NEGATIVE Final   Influenza B by PCR NEGATIVE NEGATIVE Final    Comment: (NOTE) The Xpert Xpress SARS-CoV-2/FLU/RSV plus assay is intended as an aid in the diagnosis of influenza from Nasopharyngeal swab specimens and should not be used as a sole basis for treatment. Nasal washings and aspirates are unacceptable for Xpert Xpress SARS-CoV-2/FLU/RSV testing.  Fact Sheet for Patients: EntrepreneurPulse.com.au  Fact Sheet for Healthcare Providers: IncredibleEmployment.be  This test is not yet approved or cleared by the [place] FDA and has been authorized for detection and/or diagnosis of SARS-CoV-2 by FDA under an Emergency Use Authorization (EUA). This EUA will remain in effect (meaning this test can be used) for the duration of the COVID-19 declaration under Section 564(b)(1) of the Act, 21 U.S.C. section 360bbb-3(b)(1), unless the authorization is terminated or revoked.  Performed at [facility], [address] [place], [place]  [address]   Culture, blood (Routine X 2) w Reflex to ID Panel     Status: None (Preliminary result)   Collection Time: 12/02/21  2:42 PM   Specimen: BLOOD  Result Value Ref Range Status   Specimen Description BLOOD LEFT ANTECUBITAL  Final   Special Requests Blood Culture adequate volume  Final   Culture   Final    NO GROWTH 2 DAYS Performed at [facility], [address] [place], [place] [address]    Report Status PENDING  Incomplete         Radiology Studies: US RENAL  Result Date: 12/03/2021 CLINICAL DATA:  Follow-up hydronephrosis seen on a CT scan from December 02, 2021. EXAM: RENAL / URINARY TRACT ULTRASOUND COMPLETE COMPARISON:  CT scan December 02, 2021 FINDINGS: Right Kidney: Renal measurements: 11.7 x 5.8 x 5.4 cm = volume: 191 mL. Hydronephrosis has resolved. Left Kidney: Renal measurements: 10.4 x 5.2 x 5.8 cm = volume: 165 mL. Hydronephrosis has resolved. Bladder: The bladder is decompressed with a Foley catheter. Other: None. IMPRESSION: Bilateral hydronephrosis has resolved in the interval. The bladder is decompressed with a Foley catheter. Electronically Signed   By: Dorise Bullion III M.D.   On: 12/03/2021 17:46   DG Chest Port 1 View  Result Date: 12/03/2021 CLINICAL DATA:  76 year old female PICC line placement. EXAM: PORTABLE CHEST 1 VIEW COMPARISON:  Portable chest 08/01/2021. FINDINGS: Portable AP view at 0951 hours. Right upper extremity approach PICC line catheter tip terminates at the superior right mediastinum, superior SVC level above the carina. Other lines and tubes removed since September. Larger lung volumes. Streaky linear opacity in the left mid lung most resembles atelectasis. No pneumothorax or pulmonary edema. Normal cardiac size and mediastinal contours. Visualized tracheal air column is within normal limits. Negative visible bowel gas. No acute osseous abnormality identified. IMPRESSION: 1. Right upper extremity approach PICC line catheter tip at the  superior SVC level. 2. Larger lung volumes with left lung atelectasis versus scarring. Electronically Signed   By: Genevie Samyia M.D.   On: 12/03/2021 10:20        Scheduled Meds:  atorvastatin  20 mg Oral Daily   Chlorhexidine Gluconate Cloth  6 each Topical Daily   cloNIDine  0.1 mg Oral BID   insulin aspart  0-15 Units Subcutaneous TID WC   insulin aspart  0-5 Units Subcutaneous QHS   insulin glargine-yfgn  10 Units Subcutaneous BID  metoprolol tartrate  100 mg Oral BID    morphine injection  4 mg Intravenous Once   senna-docusate  1 tablet Oral BID   Continuous Infusions:  cefTRIAXone (ROCEPHIN)  IV 1 g (12/04/21 1227)   lactated ringers 75 mL/hr at 12/04/21 1035   vancomycin 1,250 mg (12/04/21 1305)     LOS: 2 days    Time spent: 45 minutes    Sidney Ace, MD [facility]   If 7PM-7AM, please contact night-coverage  12/04/2021, 2:42 PM

## 2021-12-04 NOTE — Progress Notes (Signed)
Initial Nutrition Assessment  DOCUMENTATION CODES:   Obesity unspecified  INTERVENTION:   -Ensure Enlive po TID, each supplement provides 350 kcal and 20 grams of protein  -Hormel Shake BID, each supplement provides 520 kcals and 22 grams protein -MVI with minerals daily  -Liberalize diet to regular   NUTRITION DIAGNOSIS:   Increased nutrient needs related to wound healing as evidenced by estimated needs.  GOAL:   Patient will meet greater than or equal to 90% of their needs  MONITOR:   PO intake, Supplement acceptance, Labs, Weight trends, Skin, I & O's  REASON FOR ASSESSMENT:   Consult Assessment of nutrition requirement/status  ASSESSMENT:   Heather Lopez is a 76 y.o. female with medical history significant for  HTN, HLD, T2DM, Prior B/L ureteral stents, Left UPJ calculus, Right hydronephrosis, duodenal ulcer, obesity who presents to the emergency department due to several days of intermittent lower abdominal pain which rapidly worsened last night, pain was described as dull, constant and with sensation of squeezing of the intestines, it was rated as 9/10 on pain scale with no known alleviating/aggravating factors, she complained of nausea without vomiting.  Patient states that she has been intermittently passing some small stones.  Pt admitted with severe sepsis due to complicated UTI.   Reviewed I/O's: +299 ml x 24 hours and +1.6 L since admission   Pt sleeping soundly at time of visit. She did not awaken to name being called.   Pt with poor oral intake. Noted meal completions 0-75%. Pt is currently on a carb modified diet. Due to well controlled blood sugars, poor oral intake, and weight loss, will liberalize diet to promote improved oral intake and optimize nutritional status for wound healing.   Reviewed wt hx; pt has experienced a 5.3% wt loss over the past month, which is significant for time frame.   Pt with poor oral intake and would benefit from nutrient dense  supplement. One Ensure Enlive supplement provides 350 kcals, 20 grams protein, and 44-45 grams of carbohydrate vs one Glucerna shake supplement, which provides 220 kcals, 10 grams of protein, and 26 grams of carbohydrate. Given pt's hx of DM, RD will reassess adequacy of PO intake, CBGS, and adjust supplement regimen as appropriate at follow-up.    Medications reviewed and include morphine and lactated ringers infusion @ 75 ml/hr.    Lab Results  Component Value Date   HGBA1C 5.9 (H) 12/02/2021   PTA DM medications are 0-20 units insulin aspart every 4 hours, 30 units insulin detemir BID, and 3 units insulin regular TID.   Labs reviewed: CBGS: 100-166 (inpatient orders for glycemic control are 0-15 units insulin aspart TID with meals 0-5 units insulin aspart daily at bedtime, and 10 units insulin glargine-yfgn BID).    NUTRITION - FOCUSED PHYSICAL EXAM:  Flowsheet Row Most Recent Value  Orbital Region No depletion  Upper Arm Region Mild depletion  Thoracic and Lumbar Region No depletion  Buccal Region No depletion  Temple Region No depletion  Clavicle Bone Region No depletion  Clavicle and Acromion Bone Region No depletion  Scapular Bone Region No depletion  Dorsal Hand No depletion  Patellar Region No depletion  Anterior Thigh Region No depletion  Posterior Calf Region No depletion  Edema (RD Assessment) Mild  Hair Reviewed  Eyes Reviewed  Mouth Reviewed  Skin Reviewed  Nails Reviewed       Diet Order:   Diet Order             Diet  Carb Modified Fluid consistency: Thin; Room service appropriate? Yes  Diet effective now                   EDUCATION NEEDS:   No education needs have been identified at this time  Skin:  Skin Assessment: Skin Integrity Issues: Skin Integrity Issues:: Stage IV Stage IV: sacrum  Last BM:  11/30/20  Height:   Ht Readings from Last 1 Encounters:  12/02/21 5\' 3"  (1.6 m)    Weight:   Wt Readings from Last 1 Encounters:   12/02/21 87.2 kg    Ideal Body Weight:  52.3 kg  BMI:  Body mass index is 34.06 kg/m.  Estimated Nutritional Needs:   Kcal:  1800-2000  Protein:  105-120 grams  Fluid:  > 1.8 L    Loistine Chance, RD, LDN, [place] Registered Dietitian II Certified Diabetes Care and Education Specialist Please refer to [facility] for RD and/or RD on-call/weekend/after hours pager

## 2021-12-04 NOTE — Progress Notes (Signed)
Date of Admission:  12/02/2021    ID: Heather Lopez is a 76 y.o. female  Principal Problem:   UTI (urinary tract infection) Active Problems:   Acute kidney injury superimposed on CKD (HCC)   Duodenal ulcer perforation (HCC)   Essential hypertension   Sacral decubitus ulcer   Acute osteomyelitis of coccyx (HCC)   Hyperkalemia   Hyponatremia   Hyperglycemia due to diabetes mellitus (HCC)   Elevated alkaline phosphatase level   Elevated lipase   Hypoalbuminemia due to protein-calorie malnutrition (HCC)   Obesity (BMI 30-39.9)    Subjective: Doing better Abdominal pain much better Occasional pangs in the suprapubic area Had wound vac  Medications:   atorvastatin  20 mg Oral Daily   Chlorhexidine Gluconate Cloth  6 each Topical Daily   cloNIDine  0.1 mg Oral BID   insulin aspart  0-15 Units Subcutaneous TID WC   insulin aspart  0-5 Units Subcutaneous QHS   insulin glargine-yfgn  10 Units Subcutaneous BID   metoprolol tartrate  100 mg Oral BID    morphine injection  4 mg Intravenous Once    Objective: Vital signs in last 24 hours: Temp:  [97.8 F (36.6 C)-98.8 F (37.1 C)] 97.8 F (36.6 C) (01/09 0837) Pulse Rate:  [83-103] 100 (01/09 0837) Resp:  [17-19] 17 (01/09 0837) BP: (170-198)/(55-87) 182/87 (01/09 0837) SpO2:  [96 %-100 %] 100 % (01/09 0837)  PHYSICAL EXAM:  General: Alert, cooperative, no distress, appears stated age.  Head: Normocephalic, without obvious abnormality, atraumatic. Eyes: Conjunctivae clear, anicteric sclerae. Pupils are equal ENT Nares normal. No drainage or sinus tenderness. Lips, mucosa, and tongue normal. No Thrush Neck: Supple, symmetrical, no adenopathy, thyroid: non tender no carotid bruit and no JVD. Back: sacral wound has a wound vac. Lungs: b/l air entry Heart: Regular rate and rhythm, no murmur, rub or gallop. Abdomen: Soft, previous peg site healed Extremities: atraumatic, no cyanosis. No edema. No clubbing Skin: No rashes or  lesions. Or bruising Lymph: Cervical, supraclavicular normal. Neurologic: Grossly non-focal  Lab Results Recent Labs    12/03/21 0451 12/04/21 0500  WBC 25.4* 11.6*  HGB 11.2* 9.7*  HCT 34.9* 30.9*  NA 137 138  K 5.1 3.8  CL 111 109  CO2 15* 22  BUN 51* 43*  CREATININE 2.00* 1.64*   Liver Panel Recent Labs    12/02/21 0408 12/03/21 0451  PROT 7.6 6.9  ALBUMIN 3.1* 2.8*  AST 13* 11*  ALT 9 7  ALKPHOS 158* 134*  BILITOT 0.7 0.7    Microbiology: 12/02/21 BC- NG UC proteus Studies/Results: US RENAL  Result Date: 12/03/2021 CLINICAL DATA:  Follow-up hydronephrosis seen on a CT scan from December 02, 2021. EXAM: RENAL / URINARY TRACT ULTRASOUND COMPLETE COMPARISON:  CT scan December 02, 2021 FINDINGS: Right Kidney: Renal measurements: 11.7 x 5.8 x 5.4 cm = volume: 191 mL. Hydronephrosis has resolved. Left Kidney: Renal measurements: 10.4 x 5.2 x 5.8 cm = volume: 165 mL. Hydronephrosis has resolved. Bladder: The bladder is decompressed with a Foley catheter. Other: None. IMPRESSION: Bilateral hydronephrosis has resolved in the interval. The bladder is decompressed with a Foley catheter. Electronically Signed   By: Dorise Bullion III M.D.   On: 12/03/2021 17:46   DG Chest Port 1 View  Result Date: 12/03/2021 CLINICAL DATA:  76 year old female PICC line placement. EXAM: PORTABLE CHEST 1 VIEW COMPARISON:  Portable chest 08/01/2021. FINDINGS: Portable AP view at 0951 hours. Right upper extremity approach PICC line catheter tip terminates  at the superior right mediastinum, superior SVC level above the carina. Other lines and tubes removed since September. Larger lung volumes. Streaky linear opacity in the left mid lung most resembles atelectasis. No pneumothorax or pulmonary edema. Normal cardiac size and mediastinal contours. Visualized tracheal air column is within normal limits. Negative visible bowel gas. No acute osseous abnormality identified. IMPRESSION: 1. Right upper extremity  approach PICC line catheter tip at the superior SVC level. 2. Larger lung volumes with left lung atelectasis versus scarring. Electronically Signed   By: Genevie Kathrene M.D.   On: 12/03/2021 10:20     Assessment/Plan:  Bilateral hydronephrosis.  With bilateral ureteral stents. Bladder distention complicated UTI- has proteus in culture Patient has a history of Candida glabrata h/o pyelonephrosis and perirenal abscess on the left.  She had left PUJ stone The stents have  been in place since August 2022.  She needs urology consult and exchange of stents. Pt currently on vanco and ceftriaxone- DC vanco Will get renal US to look for resolution of hydronephrosis If better can change ceftriaxone to cefazolin  Sacral decubitus stage IV-has wound vac- looks clean   AKI on CKD  needing dialysis in August 2022.not anymore   Hyperkalemia being corrected  Diabetes mellitus  History of perforated duodenum with surgery in August 2022.

## 2021-12-04 NOTE — Progress Notes (Addendum)
PICC line dressing done; exposed catheter was noted to be covered with gauze,coiled under, exposed catheter (7 cm). Gauze was sticking to the catheter from tape. Dressing done using sterile technique; assisted by another staff. Biopatch applied, tegaderm dressing on. Flushed with good blood return. Will notify IV team.

## 2021-12-04 NOTE — NC FL2 (Signed)
[place] LEVEL OF CARE SCREENING TOOL     IDENTIFICATION  Patient Name: Heather Lopez Birthdate: Jul 28, 1946 Sex: female Admission Date (Current Location): 12/02/2021  [facility] and [place] Number:  Engineering geologist and Address:  [facility], [address] [place], [place] [address]      Provider Number: [id number]  Attending Physician Name and Address:  Sidney Ace, MD  Relative Name and Phone Number:  Elon Spanner, [phone]    Current Level of Care: Hospital Recommended Level of Care: [place] Prior Approval Number:    Date Approved/Denied:   PASRR Number: [id number]  Discharge Plan: SNF    Current Diagnoses: Patient Active Problem List   Diagnosis Date Noted   UTI (urinary tract infection) 12/02/2021   Sacral decubitus ulcer 12/02/2021   Acute osteomyelitis of coccyx ([place]) 12/02/2021   Abdominal pain 12/02/2021   Nausea & vomiting 12/02/2021   Hyperkalemia 12/02/2021   Hyponatremia 12/02/2021   Hyperglycemia due to diabetes mellitus ([place]) 12/02/2021   Elevated alkaline phosphatase level 12/02/2021   Elevated lipase 12/02/2021   Hypoalbuminemia due to protein-calorie malnutrition ([place]) 12/02/2021   Obesity (BMI 30-39.9) 12/02/2021   Anxiety 10/10/2021   Primary localized osteoarthritis of pelvic region and thigh 10/10/2021   Benign hypertensive kidney disease with chronic kidney disease 09/26/2021   Secondary hyperparathyroidism of renal origin ([place]) 09/26/2021   Hypokalemia 07/20/2021   Renal abscess    Essential hypertension    Lethargy    Duodenal ulcer perforation (HCC)    Demand ischemia of myocardium (HCC)    Atrial flutter with rapid ventricular response ([place]) 07/07/2021   Endotracheally intubated 07/07/2021   Bilateral nephrolithiasis 07/07/2021   Symptomatic anemia 07/07/2021   Pressure injury of skin 06/30/2021   Pneumoperitoneum    Acute respiratory failure with hypoxia ([place])  06/27/2021   Severe sepsis with septic shock ([place]) 06/27/2021   Acute lower UTI 06/27/2021   Acute kidney injury superimposed on CKD ([place]) 06/27/2021   Type 2 diabetes mellitus with stage 4 chronic kidney disease, with long-term current use of insulin ([place]) 03/03/2018   Vitamin D deficiency 08/21/2016   IBS (irritable bowel syndrome) 08/21/2016   Special screening for malignant neoplasms, colon    Benign neoplasm of transverse colon    Benign neoplasm of sigmoid colon    Uncontrolled type 2 diabetes mellitus with insulin therapy ([place]) 07/12/2015   Benign hypertension with CKD (chronic kidney disease) stage III ([place]) 07/12/2015   Hyperlipidemia associated with type 2 diabetes mellitus ([place]) 07/12/2015   Depression 07/12/2015   BMI 40.0-44.9, adult ([place]) 07/12/2015    Orientation RESPIRATION BLADDER Height & Weight     Self, Time, Situation, Place  Normal Continent Weight: 87.2 kg Height:  5\' 3"  (160 cm)  BEHAVIORAL SYMPTOMS/MOOD NEUROLOGICAL BOWEL NUTRITION STATUS      Continent Diet (heart healthy carb modified)  AMBULATORY STATUS COMMUNICATION OF NEEDS Skin   Extensive Assist Verbally Wound Vac (MWF dressing change)                       Personal Care Assistance Level of Assistance  Bathing, Dressing, Feeding Bathing Assistance: Limited assistance Feeding assistance: Independent Dressing Assistance: Limited assistance     Functional Limitations Info             SPECIAL CARE FACTORS FREQUENCY  PT (By licensed PT), OT (By licensed OT)     PT Frequency: 5 days a week OT Frequency: 5  days a week            Contractures Contractures Info: Not present    Additional Factors Info  Code Status, Allergies Code Status Info: DNR Allergies Info: Contrast Media  (Iodinated Contrast Media), Iodine           Current Medications (12/04/2021):  This is the current hospital active medication list Current Facility-Administered Medications  Medication Dose Route  Frequency Provider Last Rate Last Admin   atorvastatin (LIPITOR) tablet 20 mg  20 mg Oral Daily Adefeso, Oladapo, DO   20 mg at 12/04/21 0847   cefTRIAXone (ROCEPHIN) 1 g in sodium chloride 0.9 % 100 mL IVPB  1 g Intravenous Q24H Arrien, Jimmy Picket, MD       Chlorhexidine Gluconate Cloth 2 % PADS 6 each  6 each Topical Daily Adefeso, Oladapo, DO   6 each at 12/03/21 0912   cloNIDine (CATAPRES) tablet 0.1 mg  0.1 mg Oral BID Arrien, Jimmy Picket, MD   0.1 mg at 12/04/21 0847   insulin aspart (novoLOG) injection 0-15 Units  0-15 Units Subcutaneous TID WC Adefeso, Oladapo, DO   2 Units at 12/03/21 1807   insulin aspart (novoLOG) injection 0-5 Units  0-5 Units Subcutaneous QHS Adefeso, Oladapo, DO       insulin glargine-yfgn (SEMGLEE) injection 10 Units  10 Units Subcutaneous BID Adefeso, Oladapo, DO   10 Units at 12/03/21 2131   metoprolol tartrate (LOPRESSOR) tablet 100 mg  100 mg Oral BID Sreenath, Sudheer B, MD       morphine 4 MG/ML injection 4 mg  4 mg Intravenous Once Adefeso, Oladapo, DO       ondansetron (ZOFRAN) injection 4 mg  4 mg Intravenous Q6H PRN Adefeso, Oladapo, DO   4 mg at 12/03/21 1249   oxyCODONE-acetaminophen (PERCOCET/ROXICET) 5-325 MG per tablet 1 tablet  1 tablet Oral Q30 min PRN Adefeso, Oladapo, DO   1 tablet at 12/02/21 0411   prochlorperazine (COMPAZINE) injection 10 mg  10 mg Intravenous Q6H PRN Adefeso, Oladapo, DO   10 mg at 12/03/21 1714   sodium bicarbonate 150 mEq in sterile water 1,150 mL infusion   Intravenous Continuous Arrien, Jimmy Picket, MD 100 mL/hr at 12/04/21 0517 New Bag at 12/04/21 0517   sodium chloride flush (NS) 0.9 % injection 10-40 mL  10-40 mL Intracatheter PRN Arrien, Jimmy Picket, MD       vancomycin (VANCOREADY) IVPB 1250 mg/250 mL  1,250 mg Intravenous Q48H Noralee Space, [facility]         Discharge Medications: Please see discharge summary for a list of discharge medications.  Relevant Imaging Results:  Relevant Lab  Results:   Additional Information SS# [id number]  Conception Oms, RN

## 2021-12-04 NOTE — Evaluation (Signed)
Occupational Therapy Evaluation Patient Details Name: Heather Lopez MRN: [id number] DOB: September 03, 1946 Today's Date: 12/04/2021   History of Present Illness Patient is a 76 year old female who presented to [facility] ED yesterday 12/02/21 with reports of lower abdominal pain and a fever. Patient was admitted with a UTI. Patient has a PMH (+) for hypertension, diabetes, hyperlipidemia, prior B/L ureteral stents, Left UPJ calculus, Right hydronephrosis, duodenal ulcer and obesity   Clinical Impression   Heather Lopez presents to OT with generalized weakness, impaired balance, and BUE tremors/edema that impacts her ability to safely and independently complete self-care tasks.  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  She lives with her husband in a two story home, but is able to live on the first floor.  Since August, she has spent time in hospitals and rehab facilities.  Prior to most recent admission, pt was able to transfer to/from wheelchair with some assistance.  Currently, she requires increased level of assistance for transfers and ADLs due to generalized weakness and impairments in BUE.  Pt declined OOB mobility assessment due to fatigue, but is able to perform grooming and feeding tasks with setup/supervision assistance.  OT provided pt with theraputty and education on HEP for upper extremity strengthening, as pt desires to return to work as Radiographer, therapeutic.  OT also provided pt and family with education re: home setup and DME recommendations.  Ms. Gilliam will likely continue to benefit from skilled OT services in acute setting to support functional strengthening, balance, and safety and independence in ADLs.  Recommend short term rehab upon discharge to further address these goals.     Recommendations for follow up therapy are one component of a multi-disciplinary discharge planning process, led by the attending physician.  Recommendations may be updated  based on patient status, additional functional criteria and insurance authorization.   Follow Up Recommendations  Skilled nursing-short term rehab (<3 hours/day)    Assistance Recommended at Discharge Frequent or constant Supervision/Assistance  Patient can return home with the following A lot of help with walking and/or transfers;A lot of help with bathing/dressing/bathroom;Assistance with cooking/housework;Assist for transportation;Help with stairs or ramp for entrance    Functional Status Assessment  Patient has had a recent decline in their functional status and demonstrates the ability to make significant improvements in function in a reasonable and predictable amount of time.  Equipment Recommendations  BSC/3in1    Recommendations for Other Services       Precautions / Restrictions Precautions Precautions: Fall Restrictions Weight Bearing Restrictions: No      Mobility Bed Mobility Overal bed mobility: Needs Assistance                  Transfers Overall transfer level: Needs assistance                 General transfer comment: pt declined assessment      Balance Overall balance assessment: Needs assistance                                         ADL either performed or assessed with clinical judgement   ADL Overall ADL's : Needs assistance/impaired Eating/Feeding: Set up;Bed level   Grooming: Set up;Bed level  General ADL Comments: Unable to fully assess due to pt declining OOB mobility.  Pt able to perform grooming/feeding tasks with setup assist at bedlevel, though reports increased difficulty due to BUE edema/tremors     Vision Baseline Vision/History: 1 Wears glasses Ability to See in Adequate Light: 1 Impaired (reports poor vision in R eye) Patient Visual Report: No change from baseline       Perception     Praxis      Pertinent Vitals/Pain Pain Assessment: No/denies  pain Pain Location: reports some intermittent pressure/pain with urination, but denies current pain Pain Intervention(s): Limited activity within patient's tolerance;Monitored during session     Hand Dominance Right   Extremity/Trunk Assessment Upper Extremity Assessment Upper Extremity Assessment: Generalized weakness (L grip strength > R grip strength, bilaterally 3+/5 grossly)   Lower Extremity Assessment Lower Extremity Assessment: Generalized weakness RLE Deficits / Details: Decreased global RLE range of motion due patient reporting she was supposed to get hip replaced, attempted SLR: unable RLE: Unable to fully assess due to pain RLE Sensation: WNL LLE Deficits / Details: LLE strength grossly 3+/5 LLE Sensation: WNL       Communication Communication Communication: No difficulties   Cognition Arousal/Alertness: Awake/alert Behavior During Therapy: WFL for tasks assessed/performed Overall Cognitive Status: Within Functional Limits for tasks assessed                                 General Comments: A&O x4 self, location, month/year, situation     General Comments       Exercises Other Exercises Other Exercises: Provided education re: OT role and plan of care, fall and safety precautions, importance of self-care engagement and regular OOB mobility.  Also provided education re: discharge recommendations, DME and home setup   Shoulder Instructions      Home Living Family/patient expects to be discharged to:: Skilled nursing facility (Came from [facility] prior to admission, plans to return following discharge to finish therapy) Living Arrangements: Spouse/significant other Available Help at Discharge: Family;Available 24 hours/day Type of Home: House Home Access: Stairs to enter [employer] of Steps: 1 small threshold step Entrance Stairs-Rails: None Home Layout: Multi-level;Able to live on main level with bedroom/bathroom Alternate Level  Stairs-Number of Steps: 18 Alternate Level Stairs-Rails: Can reach both Bathroom Shower/Tub: Tub/shower unit;Walk-in shower (will use tub shower when returning home, as it is on the first floor)   Bathroom Toilet: Standard     Home Equipment: Cane - single point;Wheelchair Probation officer (4 wheels);Shower seat   Additional Comments: patient reports husband is in the process of renovating downstairs bathroom and will add handicap accessible walk in shower with grab bars      Prior Functioning/Environment Prior Level of Function : Needs assist       Physical Assist : Mobility (physical) Mobility (physical): Transfers   Mobility Comments: Ambulated with bilateral canes and wheelchair (manual) ADLs Comments: Reports being Mod I/ I with bathing/ dressing/ cooking/ etc. prior to August, experiencing increased impairments since current hospital admission.  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        OT Problem List: Decreased strength;Decreased range of motion;Decreased activity tolerance;Impaired balance (sitting and/or standing);Decreased cognition;Decreased knowledge of use of DME or AE;Impaired UE functional use;Pain      OT Treatment/Interventions: Self-care/ADL training;Therapeutic exercise;Energy conservation;DME and/or AE instruction;Therapeutic activities;Patient/family education;Balance training    OT  Goals(Current goals can be found in the care plan section) Acute Rehab OT Goals Patient Stated Goal: to return home, return to working as pianist OT Goal Formulation: With patient/family Time For Goal Achievement: 12/18/21 Potential to Achieve Goals: Good  OT Frequency: Min 2X/week    Co-evaluation              AM-PAC OT "6 Clicks" Daily Activity     Outcome Measure Help from another person eating meals?: None Help from another person taking care of personal grooming?: None Help from another person toileting,  which includes using toliet, bedpan, or urinal?: A Lot Help from another person bathing (including washing, rinsing, drying)?: A Lot Help from another person to put on and taking off regular upper body clothing?: A Little Help from another person to put on and taking off regular lower body clothing?: A Lot 6 Click Score: 17   End of Session    Activity Tolerance: Patient limited by fatigue Patient left: in bed;with call bell/phone within reach;with bed alarm set;with nursing/sitter in room  OT Visit Diagnosis: Muscle weakness (generalized) (M62.81);Unsteadiness on feet (R26.81)                Time: 2162-4469 OT Time Calculation (min): 22 min Charges:  OT General Charges $OT Visit: 1 Visit OT Evaluation $OT Eval Moderate Complexity: 1 Mod OT Treatments $Therapeutic Activity: 8-22 mins  Jeneen Montgomery, OTR/L 12/04/21, 10:17 AM

## 2021-12-04 NOTE — Telephone Encounter (Signed)
Husband called to cancel pt's appt. Is in the hospital.

## 2021-12-04 NOTE — Progress Notes (Signed)
Physical Therapy Treatment Patient Details Name: Heather Lopez MRN: [id number] DOB: June 15, 1946 Today's Date: 12/04/2021   History of Present Illness Patient is a 76 year old female who presented to [facility] ED yesterday 12/02/21 with reports of lower abdominal pain and a fever. Patient was admitted with a UTI. Patient has a PMH (+) for hypertension, diabetes, hyperlipidemia, prior B/L ureteral stents, Left UPJ calculus, Right hydronephrosis, duodenal ulcer and obesity    PT Comments    Pt declined mobility due to pain and weakness.  She does agree to supine ex and is assisted as needed but limited by R hip pain and catheter irritation.  She stated she feels too weak and tired to try sitting EOB and education/encouragement is provided and she stated she would try next session.  Anticipate +2 assist for mobility attempts.   Recommendations for follow up therapy are one component of a multi-disciplinary discharge planning process, led by the attending physician.  Recommendations may be updated based on patient status, additional functional criteria and insurance authorization.  Follow Up Recommendations  Skilled nursing-short term rehab (<3 hours/day)     Assistance Recommended at Discharge Frequent or constant Supervision/Assistance  Patient can return home with the following Assistance with cooking/housework;A lot of help with bathing/dressing/bathroom;A lot of help with walking and/or transfers;Two people to help with bathing/dressing/bathroom;Direct supervision/assist for financial management;Direct supervision/assist for medications management;Assist for transportation;Help with stairs or ramp for entrance;Two people to help with walking and/or transfers   Equipment Recommendations       Recommendations for Other Services       Precautions / Restrictions Precautions Precautions: Fall Restrictions Weight Bearing Restrictions: No     Mobility  Bed Mobility Overal bed mobility: Needs  Assistance             General bed mobility comments: declined    Transfers Overall transfer level: Needs assistance                 General transfer comment: pt declined assessment    Ambulation/Gait                   Stairs             Wheelchair Mobility    Modified Rankin (Stroke Patients Only)       Balance Overall balance assessment: Needs assistance                                          Cognition Arousal/Alertness: Awake/alert Behavior During Therapy: WFL for tasks assessed/performed Overall Cognitive Status: Within Functional Limits for tasks assessed                                 General Comments: A&O x4 self, location, month/year, situation        Exercises Other Exercises Other Exercises: BLE AAROM and AROM for isometrics x 10 Other Exercises: Provided education re: OT role and plan of care, fall and safety precautions, importance of self-care engagement and regular OOB mobility.  Also provided education re: discharge recommendations, DME and home setup    General Comments        Pertinent Vitals/Pain Pain Assessment: Faces Faces Pain Scale: Hurts even more Pain Location: R hip and catheter pain Pain Descriptors / Indicators: Sore;Grimacing Pain Intervention(s): Limited activity within patient's tolerance;Monitored during session  Home Living Family/patient expects to be discharged to:: Skilled nursing facility (Came from [facility] prior to admission, plans to return following discharge to finish therapy) Living Arrangements: Spouse/significant other Available Help at Discharge: Family;Available 24 hours/day Type of Home: House Home Access: Stairs to enter Entrance Stairs-Rails: None Entrance Stairs-Number of Steps: 1 small threshold step Alternate Level Stairs-Number of Steps: 18 Home Layout: Multi-level;Able to live on main level with bedroom/bathroom Home Equipment: Kasandra Knudsen  - single point;Wheelchair Probation officer (4 wheels);Shower seat Additional Comments: patient reports husband is in the process of renovating downstairs bathroom and will add handicap accessible walk in shower with grab bars    Prior Function            PT Goals (current goals can now be found in the care plan section) Progress towards PT goals: Progressing toward goals    Frequency    Min 2X/week      PT Plan Current plan remains appropriate    Co-evaluation              AM-PAC PT "6 Clicks" Mobility   Outcome Measure  Help needed turning from your back to your side while in a flat bed without using bedrails?: A Lot Help needed moving from lying on your back to sitting on the side of a flat bed without using bedrails?: Total Help needed moving to and from a bed to a chair (including a wheelchair)?: Total Help needed standing up from a chair using your arms (e.g., wheelchair or bedside chair)?: Total Help needed to walk in hospital room?: Total Help needed climbing 3-5 steps with a railing? : Total 6 Click Score: 7    End of Session   Activity Tolerance: Patient limited by pain;Patient limited by fatigue Patient left: in bed;with call bell/phone within reach;with bed alarm set;Other (comment);with family/visitor present Nurse Communication: Mobility status PT Visit Diagnosis: Muscle weakness (generalized) (M62.81);Unsteadiness on feet (R26.81);Difficulty in walking, not elsewhere classified (R26.2);Repeated falls (R29.6)     Time: 7741-2878 PT Time Calculation (min) (ACUTE ONLY): 12 min  Charges:  $Therapeutic Exercise: 8-22 mins                    Chesley Noon, PTA 12/04/21, 12:08 PM

## 2021-12-04 NOTE — Consult Note (Signed)
Pharmacy Antibiotic Note  Heather Lopez is a 76 y.o. female with medical history including diabetes, HTN, HLD, recent admission 07/17/21 - 08/03/21 for perforated duodenal ulcer, acute renal failure, and complicated UTI with bilateral hydronephrosis requiring ureteral stenting and cultured Candida glabrata. admitted on 12/02/2021 with  UTI, bilateral hydronephrosis, AKI on CKD stage IV .  Pharmacy has been consulted for vancomycin dosing.  -chronic coccygeal osteomyelitis -Ucx: 30,000 Proteus Mirabilis-  now on Ceftriaxone  Plan: Vancomycin 2 g IV x 1 Loading dose on 12/02/20 @0950 . --Scr improved 2.00>>1.64 - Will order Vancomycin 1250 mg IV q48h Goal AUC 400-550. Expected AUC: 572 SCr used: 1.64     Crcl 31 ml/min Cmin 12.8 Vd= 0.5   BMI 33  F/u Scr     Height: 5\' 3"  (160 cm) Weight: 87.2 kg (192 lb 4.8 oz) IBW/kg (Calculated) : 52.4  Temp (24hrs), Avg:98.6 F (37 C), Min:98.5 F (36.9 C), Max:98.8 F (37.1 C)  Recent Labs  Lab 12/02/21 0408 12/02/21 1442 12/03/21 0451 12/04/21 0500  WBC 15.2*  --  25.4* 11.6*  CREATININE 2.48* 2.37* 2.00* 1.64*  LATICACIDVEN 1.4  --   --   --      Estimated Creatinine Clearance: 31.5 mL/min (A) (by C-G formula based on SCr of 1.64 mg/dL (H)).    Allergies  Allergen Reactions   Contrast Media  [Iodinated Contrast Media] Anaphylaxis   Iodine Swelling    (IV only) - angioedema    Antimicrobials this admission: Cefepime 1/7 >> 1/8 Fluconazole 1/7 >> 1/8 Vancomycin 1/7 >>  CTX 1/9>>  Dose adjustments this admission: N/A  Microbiology results: 1/7 BCx: NG x 2d 1/7 UCx: 30,000 COLONIES/mL PROTEUS MIRABILIS , resistant to Cipro, nitrofurantoin  Thank you for allowing pharmacy to be a part of this patients care.  Marielena Harvell A 12/04/2021 8:27 AM

## 2021-12-04 NOTE — Telephone Encounter (Signed)
done

## 2021-12-05 ENCOUNTER — Inpatient Hospital Stay: Payer: Medicare Other

## 2021-12-05 LAB — GLUCOSE, CAPILLARY
Glucose-Capillary: 126 mg/dL — ABNORMAL HIGH (ref 70–99)
Glucose-Capillary: 187 mg/dL — ABNORMAL HIGH (ref 70–99)
Glucose-Capillary: 174 mg/dL — ABNORMAL HIGH (ref 70–99)
Glucose-Capillary: 166 mg/dL — ABNORMAL HIGH (ref 70–99)

## 2021-12-05 LAB — CREATININE, SERUM
Creatinine, Ser: 1.34 mg/dL — ABNORMAL HIGH (ref 0.44–1.00)
GFR, Estimated: 42 mL/min — ABNORMAL LOW (ref >60)

## 2021-12-05 MED ORDER — SODIUM CHLORIDE 0.9 % IV SOLN
2.0000 g | Freq: Three times a day (TID) | INTRAVENOUS | Status: DC
  Filled 2021-12-05 (×4): qty 2000

## 2021-12-05 MED ORDER — AMOXICILLIN 500 MG PO CAPS
1000.0000 mg | ORAL_CAPSULE | Freq: Three times a day (TID) | ORAL | Status: DC
  Administered 2021-12-05 – 2021-12-06 (×2): 1000 mg via ORAL
  Filled 2021-12-05 (×4): qty 2

## 2021-12-05 NOTE — Progress Notes (Signed)
Patient with 9 cm PICC line catheter exposed coiled under dressing. No blood return. Patient stated she had this PICC line placed in September. Notified MD Posey Pronto and ID Ravishankar, removed PICC per MD order. Cath tip intact. NO bleeding or complications at site. Covered with 4X4 and tegaderm.

## 2021-12-05 NOTE — Progress Notes (Signed)
[place] at [place] NAME: Heather Lopez    MR#:  [id number]  DATE OF BIRTH:  [birth year]  SUBJECTIVE:  very sleepy. Per RN right upper extremity PICC line appears out of position. No other IV access. No family at bedside.  REVIEW OF SYSTEMS:   ROS Tolerating Diet: Tolerating PT:   DRUG ALLERGIES:   Allergies  Allergen Reactions   Contrast Media  [Iodinated Contrast Media] Anaphylaxis   Iodine Swelling    (IV only) - angioedema    VITALS:  Blood pressure (!) 133/56, pulse 73, temperature 98.1 F (36.7 C), resp. rate 18, height 5\' 3"  (1.6 m), weight 87.2 kg, SpO2 98 %.  PHYSICAL EXAMINATION:   Physical Exam  GENERAL:  76 y.o.-year-old patient lying in the bed with no acute distress. He is deconditioned HEENT: Head atraumatic, normocephalic. Oropharynx and nasopharynx clear.  LUNGS: Normal breath sounds bilaterally, no wheezing, rales, rhonchi. No use of accessory muscles of respiration.  CARDIOVASCULAR: S1, S2 normal. No murmurs, rubs, or gallops.  ABDOMEN: Soft, nontender, nondistended. Bowel sounds present. EXTREMITIES: edema+ mild  NEUROLOGIC: nonfocal PSYCHIATRIC:  patient is sleepy  SKIN:  Pressure Injury 07/07/21 Buttocks Stage 2 -  Partial thickness loss of dermis presenting as a shallow open injury with a red, pink wound bed without slough. (Active)  07/07/21 0800  Location: Buttocks  Location Orientation:   Staging: Stage 2 -  Partial thickness loss of dermis presenting as a shallow open injury with a red, pink wound bed without slough.  Wound Description (Comments):   Present on Admission: No     Pressure Injury 07/24/21 Coccyx Mid Unstageable - Full thickness tissue loss in which the base of the injury is covered by slough (yellow, tan, gray, green or brown) and/or eschar (tan, brown or black) in the wound bed. Unstageable Sacral Wound (Active)  07/24/21 0000  Location: Coccyx  Location Orientation: Mid  Staging:  Unstageable - Full thickness tissue loss in which the base of the injury is covered by slough (yellow, tan, gray, green or brown) and/or eschar (tan, brown or black) in the wound bed.  Wound Description (Comments): Unstageable Sacral Wound  Present on Admission:      Pressure Injury 07/30/21 Heel Right Stage 1 -  Intact skin with non-blanchable redness of a localized area usually over a bony prominence. (Active)  07/30/21 0730  Location: Heel  Location Orientation: Right  Staging: Stage 1 -  Intact skin with non-blanchable redness of a localized area usually over a bony prominence.  Wound Description (Comments):   Present on Admission:      Pressure Injury 12/03/21 Sacrum Mid Stage 4 - Full thickness tissue loss with exposed bone, tendon or muscle. (Active)  12/03/21 0200  Location: Sacrum  Location Orientation: Mid  Staging: Stage 4 - Full thickness tissue loss with exposed bone, tendon or muscle.  Wound Description (Comments):   Present on Admission: Yes        LABORATORY PANEL:  CBC Recent Labs  Lab 12/04/21 0500  WBC 11.6*  HGB 9.7*  HCT 30.9*  PLT 215    Chemistries  Recent Labs  Lab 12/03/21 0451 12/04/21 0500 12/05/21 0627  NA 137 138  --   K 5.1 3.8  --   CL 111 109  --   CO2 15* 22  --   GLUCOSE 170* 107*  --   BUN 51* 43*  --   CREATININE 2.00* 1.64* 1.34*  CALCIUM 9.7  9.2  --   MG 1.5*  --   --   AST 11*  --   --   ALT 7  --   --   ALKPHOS 134*  --   --   BILITOT 0.7  --   --    Cardiac Enzymes No results for input(s): TROPONINI in the last 168 hours. RADIOLOGY:  US RENAL  Result Date: 12/03/2021 CLINICAL DATA:  Follow-up hydronephrosis seen on a CT scan from December 02, 2021. EXAM: RENAL / URINARY TRACT ULTRASOUND COMPLETE COMPARISON:  CT scan December 02, 2021 FINDINGS: Right Kidney: Renal measurements: 11.7 x 5.8 x 5.4 cm = volume: 191 mL. Hydronephrosis has resolved. Left Kidney: Renal measurements: 10.4 x 5.2 x 5.8 cm = volume: 165 mL.  Hydronephrosis has resolved. Bladder: The bladder is decompressed with a Foley catheter. Other: None. IMPRESSION: Bilateral hydronephrosis has resolved in the interval. The bladder is decompressed with a Foley catheter. Electronically Signed   By: Dorise Bullion III M.D.   On: 12/03/2021 17:46   ASSESSMENT AND PLAN:   76 yo female with the past medical history of HTN, dyslipidemia, T2DM, sp bilateral ureteral stents, left UPJ calculus, right hydronephrosis, pelvic abscess, candida urine infection, obesity and duodenal ulcer who presented with pelvic pain. Reported worsening lower abdominal pain, for the last several days, more intense over 24 hours leading into her hospitalization  She has a chronic indwelling foley cathter summer 2022 and chronic right upper extremity PICC line fall 2022.   --Urology exchanged Foley.  Initial bilateral hydronephrosis seen on imaging has completely resolved on follow-up renal ultrasound.   --General surgery engaged for evaluation of sacral wound.  -- Wound VAC applied at bedside.  Per last general surgery note wound checks with dressing change Monday Wednesday Friday.  Severe sepsis due to complicated urinary tract infection (obstructive uropathy and nephrolithiasis), present on admission, end organ failure AKI/ acute metabolic encephalopathy.  Urine infection associated with chronic indwelling foley catheter. --Patient remains somnolent --Leukocytosis improving --Urine culture positive for Proteus mirabilis --Blood cultures no growth to date --Foley catheter has been exchanged, appreciate urology consultation. Per urology patient will need bilateral stent exchange as outpatient --Follow-up renal ultrasound shows resolution of hydronephrosis -- patient currently on IV Rocephin. ID managing IV antibiotics. Issues with right upper extremity PICC line. Awaiting to see final recommendation regarding antibiotic. 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 Hyponatremia.  --Foley catheter has been exchanged. -Improvement in renal function and metabolic parameters --DC bicarbonate infusion --Start LR at 75 cc/h --Avoid hypotension and nephrotoxins   Stage IV sacrum pressure ulcer, chronic coccygeal osteomyelitis.  --Continue local wound care per surgery consultation. --Wound VAC in place --General surgery follow-up, wound checks MWF   T2DM/ dyslipidemia --Continue glucose cover and monitoring with insulin sliding scale.  --Basal insulin with levemir 10 units bid --Continue with satin therapy with atorvastatin.    HTN  --Restarting home regimen withMetoprolol,Clonidine --As needed IV hydralazine   Obesity class 2 -- calculated BMI is 35,9    Mild protein calorie malnutrition/ non ambulatory state.  Will consult nutrition for further recommendations      DVT prophylaxis: SQ Lovenox Code Status: DNR Family Communication: none today Disposition Plan: Status is: Inpatient   Remains inpatient appropriate because: Severe sepsis, AKI secondary to obstructive uropathy and UTI.  Renal parameters improving.  Anticipated date of discharge  1-3 days              TOTAL TIME TAKING CARE OF THIS PATIENT: 25 minutes.  >50% time spent on counselling and coordination of care  Note: This dictation was prepared with Dragon dictation along with smaller phrase technology. Any transcriptional errors that result from this process are unintentional.  Fritzi Mandes M.D    [facility]   CC: Primary care physician; [name], PA-C Patient ID: Heather Lopez, female   DOB: April 27, 1946, 76 y.o.   MRN: [id number]

## 2021-12-05 NOTE — Plan of Care (Signed)

## 2021-12-05 NOTE — TOC Progression Note (Signed)
Transition of Care [facility]) - Progression Note    Patient Details  Name: Heather Lopez MRN: [id number] Date of Birth: 05-09-1946  Transition of Care [facility]) CM/SW [place], RN Phone Number: 12/05/2021, 2:08 PM  Clinical Narrative:   [facility] confirmed that the patient can DC back there at [facility] for STR    Expected Discharge Plan: [place] Barriers to Discharge: Continued Medical Work up  Expected Discharge Plan and Services Expected Discharge Plan: [place] Acute Care Choice: Resumption of Svcs/PTA Provider Living arrangements for the past 2 months: [place]                                       Social Determinants of Health (SDOH) Interventions    Readmission Risk Interventions Readmission Risk Prevention Plan 12/03/2021  Transportation Screening Complete  Medication Review ([place]) Complete  PCP or Specialist appointment within 3-5 days of discharge Complete  SW Recovery Care/Counseling Consult Complete  [place] Complete  Some recent data might be hidden

## 2021-12-05 NOTE — Progress Notes (Signed)
Physical Therapy Treatment Patient Details Name: Heather Lopez MRN: [id number] DOB: Mar 18, 1946 Today's Date: 12/05/2021   History of Present Illness Patient is a 76 year old female who presented to [facility] ED yesterday 12/02/21 with reports of lower abdominal pain and a fever. Patient was admitted with a UTI. Patient has a PMH (+) for hypertension, diabetes, hyperlipidemia, prior B/L ureteral stents, Left UPJ calculus, Right hydronephrosis, duodenal ulcer and obesity    PT Comments    Pt tolerated PT session well today. Able to transfer to edge of bed with ModA and sit in upright position x 25 minutes without LOB. No c/o dizziness or increased pain. Pt did c/o her back starting to ache after a while due to postural weakness. Pt encouraged to sit edge of bed for meals. Continue to recommend SNF once medically stable for discharge.    Recommendations for follow up therapy are one component of a multi-disciplinary discharge planning process, led by the attending physician.  Recommendations may be updated based on patient status, additional functional criteria and insurance authorization.  Follow Up Recommendations  Skilled nursing-short term rehab (<3 hours/day)     Assistance Recommended at Discharge Frequent or constant Supervision/Assistance  Patient can return home with the following Assistance with cooking/housework;A lot of help with bathing/dressing/bathroom;A lot of help with walking and/or transfers;Two people to help with bathing/dressing/bathroom;Direct supervision/assist for financial management;Direct supervision/assist for medications management;Assist for transportation;Help with stairs or ramp for entrance;Two people to help with walking and/or transfers   Equipment Recommendations  Rolling walker (2 wheels)    Recommendations for Other Services       Precautions / Restrictions Precautions Precautions: Fall Precaution Comments:  (pt is very concerned about her catheter and wound vac  lines)     Mobility  Bed Mobility Overal bed mobility: Needs Assistance Bed Mobility: Supine to Sit     Supine to sit: Mod assist;Max assist     General bed mobility comments:  (slow movements)    Transfers                        Ambulation/Gait                   Stairs             Wheelchair Mobility    Modified Rankin (Stroke Patients Only)       Balance Overall balance assessment: Needs assistance Sitting-balance support: Feet supported Sitting balance-Leahy Scale: Good Sitting balance - Comments:  (Pt sat up at edge of bed x 25 minutes while reaching out of BOS with supervision for safety)                                    Cognition Arousal/Alertness: Awake/alert Behavior During Therapy: WFL for tasks assessed/performed Overall Cognitive Status: Within Functional Limits for tasks assessed                                 General Comments: A&O x4 self, location, month/year, situation        Exercises      General Comments General comments (skin integrity, edema, etc.): Upon session completion, pt positioned on L side to promote skin integrity      Pertinent Vitals/Pain Pain Assessment: Faces Faces Pain Scale: Hurts little more Pain Location: R hip and catheter  pain Pain Descriptors / Indicators: Sore;Grimacing Pain Intervention(s): Monitored during session    Home Living                          Prior Function            PT Goals (current goals can now be found in the care plan section) Acute Rehab PT Goals Patient Stated Goal: to get better and get home Progress towards PT goals: Progressing toward goals    Frequency    Min 2X/week      PT Plan Current plan remains appropriate    Co-evaluation              AM-PAC PT "6 Clicks" Mobility   Outcome Measure  Help needed turning from your back to your side while in a flat bed without using bedrails?: A Lot Help  needed moving from lying on your back to sitting on the side of a flat bed without using bedrails?: Total Help needed moving to and from a bed to a chair (including a wheelchair)?: Total Help needed standing up from a chair using your arms (e.g., wheelchair or bedside chair)?: Total Help needed to walk in hospital room?: Total Help needed climbing 3-5 steps with a railing? : Total 6 Click Score: 7    End of Session   Activity Tolerance: Patient tolerated treatment well Patient left: in bed;with call bell/phone within reach;with bed alarm set;Other (comment);with family/visitor present Nurse Communication: Mobility status PT Visit Diagnosis: Muscle weakness (generalized) (M62.81);Unsteadiness on feet (R26.81);Difficulty in walking, not elsewhere classified (R26.2);Repeated falls (R29.6)     Time: 3295-1884 PT Time Calculation (min) (ACUTE ONLY): 27 min  Charges:  $Therapeutic Activity: 23-37 mins                    Mikel Cella, PTA    Josie Dixon 12/05/2021, 2:05 PM

## 2021-12-06 DIAGNOSIS — N390 Urinary tract infection, site not specified: Secondary | ICD-10-CM | POA: Diagnosis not present

## 2021-12-06 DIAGNOSIS — L89154 Pressure ulcer of sacral region, stage 4: Secondary | ICD-10-CM | POA: Diagnosis not present

## 2021-12-06 LAB — GLUCOSE, CAPILLARY
Glucose-Capillary: 130 mg/dL — ABNORMAL HIGH (ref 70–99)
Glucose-Capillary: 114 mg/dL — ABNORMAL HIGH (ref 70–99)
Glucose-Capillary: 84 mg/dL (ref 70–99)
Glucose-Capillary: 196 mg/dL — ABNORMAL HIGH (ref 70–99)

## 2021-12-06 LAB — CREATININE, SERUM
Creatinine, Ser: 1.52 mg/dL — ABNORMAL HIGH (ref 0.44–1.00)
GFR, Estimated: 36 mL/min — ABNORMAL LOW (ref >60)

## 2021-12-06 MED ORDER — SODIUM CHLORIDE 0.9 % IV SOLN
2.0000 g | Freq: Four times a day (QID) | INTRAVENOUS | Status: DC
  Administered 2021-12-06: 2 g via INTRAVENOUS
  Filled 2021-12-06: qty 2
  Filled 2021-12-06 (×4): qty 2000

## 2021-12-06 MED ORDER — SODIUM CHLORIDE 0.9 % IV SOLN
2.0000 g | Freq: Three times a day (TID) | INTRAVENOUS | Status: DC
  Administered 2021-12-06 – 2021-12-09 (×8): 2 g via INTRAVENOUS
  Filled 2021-12-06 (×3): qty 2000
  Filled 2021-12-06: qty 2
  Filled 2021-12-06: qty 2000
  Filled 2021-12-06: qty 2
  Filled 2021-12-06 (×2): qty 2000
  Filled 2021-12-06: qty 2
  Filled 2021-12-06 (×2): qty 2000

## 2021-12-06 NOTE — Progress Notes (Signed)
[place] at [place] NAME: Heather Lopez    MR#:  [id number]  DATE OF BIRTH:  12-09-1945  SUBJECTIVE:  patient seen earlier with Dr. Steva Ready and husband at bedside. Buttock wound looks better. Red granulation tissue. No fever. Patient more awake and conversely. Eating better today per husband.                                                                                                         REVIEW OF SYSTEMS:   Review of Systems  Constitutional:  Positive for malaise/fatigue. Negative for chills, fever and weight loss.  HENT:  Negative for ear discharge, ear pain and nosebleeds.   Eyes:  Negative for blurred vision, pain and discharge.  Respiratory:  Negative for sputum production, shortness of breath, wheezing and stridor.   Cardiovascular:  Negative for chest pain, palpitations, orthopnea and PND.  Gastrointestinal:  Negative for abdominal pain, diarrhea, nausea and vomiting.  Genitourinary:  Negative for frequency and urgency.  Musculoskeletal:  Negative for back pain and joint pain.  Neurological:  Positive for weakness. Negative for sensory change, speech change and focal weakness.  Psychiatric/Behavioral:  Negative for depression and hallucinations. The patient is not nervous/anxious.   Tolerating Diet:yes Tolerating PT:   DRUG ALLERGIES:   Allergies  Allergen Reactions   Contrast Media  [Iodinated Contrast Media] Anaphylaxis   Iodine Swelling    (IV only) - angioedema    VITALS:  Blood pressure (!) 112/54, pulse 64, temperature 98.4 F (36.9 C), resp. rate 17, height 5\' 3"  (1.6 m), weight 87.2 kg, SpO2 95 %.  PHYSICAL EXAMINATION:   Physical Exam  GENERAL:  76 y.o.-year-old patient lying in the bed with no acute distress. sheis deconditioned HEENT: Head atraumatic, normocephalic. Oropharynx and nasopharynx clear.  LUNGS: Normal breath sounds bilaterally, no wheezing, rales, rhonchi. No use of accessory muscles of  respiration.  CARDIOVASCULAR: S1, S2 normal. No murmurs, rubs, or gallops.  ABDOMEN: Soft, nontender, nondistended. Bowel sounds present. EXTREMITIES: edema+ mild  NEUROLOGIC: nonfocal PSYCHIATRIC:  patient is sleepy  SKIN:   Pressure Injury 07/07/21 Buttocks Stage 2 -  Partial thickness loss of dermis presenting as a shallow open injury with a red, pink wound bed without slough. (Active)  07/07/21 0800  Location: Buttocks  Location Orientation:   Staging: Stage 2 -  Partial thickness loss of dermis presenting as a shallow open injury with a red, pink wound bed without slough.  Wound Description (Comments):   Present on Admission: No     Pressure Injury 07/24/21 Coccyx Mid Unstageable - Full thickness tissue loss in which the base of the injury is covered by slough (yellow, tan, gray, green or brown) and/or eschar (tan, brown or black) in the wound bed. Unstageable Sacral Wound (Active)  07/24/21 0000  Location: Coccyx  Location Orientation: Mid  Staging: Unstageable - Full thickness tissue loss in which the base of the injury is covered by slough (yellow, tan, gray, green or brown) and/or eschar (tan, brown or black) in the wound bed.  Wound Description (Comments): Unstageable Sacral Wound  Present on Admission:      Pressure Injury 07/30/21 Heel Right Stage 1 -  Intact skin with non-blanchable redness of a localized area usually over a bony prominence. (Active)  07/30/21 0730  Location: Heel  Location Orientation: Right  Staging: Stage 1 -  Intact skin with non-blanchable redness of a localized area usually over a bony prominence.  Wound Description (Comments):   Present on Admission:      Pressure Injury 12/03/21 Sacrum Mid Stage 4 - Full thickness tissue loss with exposed bone, tendon or muscle. (Active)  12/03/21 0200  Location: Sacrum  Location Orientation: Mid  Staging: Stage 4 - Full thickness tissue loss with exposed bone, tendon or muscle.  Wound Description (Comments):    Present on Admission: Yes        LABORATORY PANEL:  CBC Recent Labs  Lab 12/04/21 0500  WBC 11.6*  HGB 9.7*  HCT 30.9*  PLT 215     Chemistries  Recent Labs  Lab 12/03/21 0451 12/04/21 0500 12/05/21 0627 12/06/21 0557  NA 137 138  --   --   K 5.1 3.8  --   --   CL 111 109  --   --   CO2 15* 22  --   --   GLUCOSE 170* 107*  --   --   BUN 51* 43*  --   --   CREATININE 2.00* 1.64*   < > 1.52*  CALCIUM 9.7 9.2  --   --   MG 1.5*  --   --   --   AST 11*  --   --   --   ALT 7  --   --   --   ALKPHOS 134*  --   --   --   BILITOT 0.7  --   --   --    < > = values in this interval not displayed.    Cardiac Enzymes No results for input(s): TROPONINI in the last 168 hours. RADIOLOGY:  No results found. ASSESSMENT AND PLAN:   76 yo female with the past medical history of HTN, dyslipidemia, T2DM, sp bilateral ureteral stents, left UPJ calculus, right hydronephrosis, pelvic abscess, candida urine infection, obesity and duodenal ulcer who presented with pelvic pain. Reported worsening lower abdominal pain, for the last several days, more intense over 24 hours leading into her hospitalization  She has a chronic indwelling foley cathter summer 2022 and chronic right upper extremity PICC line fall 2022.   --Urology exchanged Foley.  Initial bilateral hydronephrosis seen on imaging has completely resolved on follow-up renal ultrasound.   --General surgery engaged for evaluation of sacral wound.  -- Wound VAC applied at bedside.  Per last general surgery note wound checks with dressing change Monday Wednesday Friday.  Severe sepsis due to complicated urinary tract infection (obstructive uropathy and nephrolithiasis), present on admission, end organ failure AKI/ acute metabolic encephalopathy.  Urine infection associated with chronic indwelling foley catheter. --Patient remains somnolent --Leukocytosis improving --Urine culture positive for Proteus mirabilis --Blood  cultures no growth to date --Foley catheter has been exchanged, appreciate urology consultation. Per urology patient will need bilateral stent exchange as outpatient --Follow-up renal ultrasound shows resolution of hydronephrosis -- patient currently on IV Rocephin. ID managing IV antibiotics. Issues with right upper extremity PICC line. Awaiting to see final recommendation regarding antibiotic. If IV antibiotic is recommended patient will need another PICC line --1/11-- will continue IV ampicillin through peripheral  line. Discussed with urology patient will get her stone treatment and stent exchanged on Friday by Dr Diamantina Providence.   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 Hyponatremia.  --Foley catheter has been exchanged. -Improvement in renal function and metabolic parameters --DC bicarbonate infusion --Avoid hypotension and nephrotoxins --good UOP. D/c IVF for now   Stage IV sacrum pressure ulcer, chronic coccygeal osteomyelitis.  --Continue local wound care per surgery consultation. --Wound VAC in place--wound looks better --General surgery follow-up, wound checks MWF   T2DM/ dyslipidemia --Continue glucose cover and monitoring with insulin sliding scale.  --Basal insulin with levemir 10 units bid --Continue with satin therapy with atorvastatin.    HTN  --Restarting home regimen withMetoprolol,Clonidine --As needed IV hydralazine   Obesity class 2 -- calculated BMI is 35,9    Mild protein calorie malnutrition/ non ambulatory state.  Cont RD recs. Per husband pt eating better     DVT prophylaxis: SQ Lovenox Code Status: DNR Family Communication: husband at bedside Disposition Plan: Status is: Inpatient   Remains inpatient appropriate because: Severe sepsis, AKI secondary to obstructive uropathy and UTI.  Renal parameters improving.   Pt to get urological procedure on Friday by Dr Diamantina [place]. D/c later next  week once rehab available            TOTAL TIME TAKING CARE OF THIS PATIENT: 25 minutes.  >50% time spent on counselling and coordination of care  Note: This dictation was prepared with Dragon dictation along with smaller phrase technology. Any transcriptional errors that result from this process are unintentional.  Fritzi Mandes M.D    [facility]   CC: Primary care physician; [name], PA-C Patient ID: Heather Lopez, female   DOB: 07/20/1946, 76 y.o.   MRN: [id number]

## 2021-12-06 NOTE — Progress Notes (Signed)
Wound vac change reported.  No reported issues with wound.  Continue care per primary. Please call with additional questions or concerns

## 2021-12-06 NOTE — TOC Progression Note (Signed)
Transition of Care [facility]) - Progression Note    Patient Details  Name: MALONE ADMIRE MRN: [id number] Date of Birth: 04/17/1946  Transition of Care [facility]) CM/SW [place], RN Phone Number: 12/06/2021, 4:32 PM  Clinical Narrative:    Resent to [facility] asking to review for a bed   Expected Discharge Plan: [place] Barriers to Discharge: Continued Medical Work up  Expected Discharge Plan and Services Expected Discharge Plan: [place] Acute Care Choice: Resumption of Svcs/PTA Provider Living arrangements for the past 2 months: [place]                                       Social Determinants of Health (SDOH) Interventions    Readmission Risk Interventions Readmission Risk Prevention Plan 12/03/2021  Transportation Screening Complete  Medication Review [employer] [facility]) Complete  PCP or Specialist appointment within 3-5 days of discharge Complete  SW Recovery Care/Counseling Consult Complete  [place] Complete  Some recent data might be hidden

## 2021-12-06 NOTE — Progress Notes (Signed)
Physical Therapy Treatment Patient Details Name: Heather Lopez MRN: [id number] DOB: [birth year] Today's Date: 12/06/2021   History of Present Illness Patient is a 76 year old female who presented to [facility] ED yesterday 12/02/21 with reports of lower abdominal pain and a fever. Patient was admitted with a UTI. Patient has a PMH (+) for hypertension, diabetes, hyperlipidemia, prior B/L ureteral stents, Left UPJ calculus, Right hydronephrosis, duodenal ulcer and obesity    PT Comments    Pt received in Semi-Fowler's position and agreeable to therapy.  Pt continues to be fixated on cather and wound vac upon entering into the room.  Pt is able to participate with bed level exercises and with notable assistance required for the R LE when performing hip abduction.  Pt has large amounts of pain when performing hip flexion, which is why AAROM was utilized to assist with lifting the LE above the bed.  Pt was agreeable to bed mobility with OT in room for co-treat, however therapists were interrupted by PA students reporting to room to let pt know the surgeon would be arriving to the room at later time.  Pt's motivation for bed mobility decreased significantly upon arrival of PA students.  Therapy will coordinate at later date/time to assess and treat patient.  PA students advised to set bed alarm once finished with pt.  Current discharge plans to SNF remain appropriate at this time.  Pt will continue to benefit from skilled therapy in order to address deficits listed below.    Recommendations for follow up therapy are one component of a multi-disciplinary discharge planning process, led by the attending physician.  Recommendations may be updated based on patient status, additional functional criteria and insurance authorization.  Follow Up Recommendations  Skilled nursing-short term rehab (<3 hours/day)     Assistance Recommended at Discharge Frequent or constant Supervision/Assistance  Patient can return home with  the following Assistance with cooking/housework;A lot of help with bathing/dressing/bathroom;A lot of help with walking and/or transfers;Two people to help with bathing/dressing/bathroom;Direct supervision/assist for financial management;Direct supervision/assist for medications management;Assist for transportation;Help with stairs or ramp for entrance;Two people to help with walking and/or transfers   Equipment Recommendations  Rolling walker (2 wheels)    Recommendations for Other Services       Precautions / Restrictions Precautions Precautions: Fall Restrictions Weight Bearing Restrictions: No     Mobility  Bed Mobility Overal bed mobility: Needs Assistance             General bed mobility comments: pt declined due to interruption from PA students midway through session. (slow movements)    Transfers                        Ambulation/Gait                   Stairs             Wheelchair Mobility    Modified Rankin (Stroke Patients Only)       Balance                                            Cognition Arousal/Alertness: Awake/alert Behavior During Therapy: WFL for tasks assessed/performed Overall Cognitive Status: Within Functional Limits for tasks assessed  General Comments: A&O x4 self, location, month/year, situation        Exercises Total Joint Exercises Ankle Circles/Pumps: AROM;Strengthening;Both;10 reps;Supine Quad Sets: AROM;Strengthening;Both;10 reps;Supine Hip ABduction/ADduction: AROM;Left;AAROM;Right;Strengthening;10 reps;Supine    General Comments        Pertinent Vitals/Pain      Home Living                          Prior Function            PT Goals (current goals can now be found in the care plan section) Acute Rehab PT Goals Patient Stated Goal: to get better and get home PT Goal Formulation: With patient Time For Goal  Achievement: 12/17/21 Potential to Achieve Goals: Poor Progress towards PT goals: Not progressing toward goals - comment (pt with lack of motivation to participate with therapy leading to decreased functional mobility.)    Frequency    Min 2X/week      PT Plan Current plan remains appropriate    Co-evaluation              AM-PAC PT "6 Clicks" Mobility   Outcome Measure  Help needed turning from your back to your side while in a flat bed without using bedrails?: A Lot Help needed moving from lying on your back to sitting on the side of a flat bed without using bedrails?: Total Help needed moving to and from a bed to a chair (including a wheelchair)?: Total Help needed standing up from a chair using your arms (e.g., wheelchair or bedside chair)?: Total Help needed to walk in hospital room?: Total Help needed climbing 3-5 steps with a railing? : Total 6 Click Score: 7    End of Session   Activity Tolerance: Patient tolerated treatment well Patient left: in bed;with call bell/phone within reach;with bed alarm set;Other (comment);with family/visitor present Nurse Communication: Mobility status PT Visit Diagnosis: Muscle weakness (generalized) (M62.81);Unsteadiness on feet (R26.81);Difficulty in walking, not elsewhere classified (R26.2);Repeated falls (R29.6)     Time: 0600-4599 PT Time Calculation (min) (ACUTE ONLY): 12 min  Charges:  $Therapeutic Exercise: 8-22 mins                     Gwenlyn Saran, PT, DPT 12/06/21, 3:43 PM    [name] 12/06/2021, 3:38 PM

## 2021-12-06 NOTE — Plan of Care (Signed)

## 2021-12-06 NOTE — TOC Progression Note (Addendum)
Transition of Care [facility]) - Progression Note    Patient Details  Name: Heather Lopez MRN: [id number] Date of Birth: [birth year]  Transition of Care [facility]) CM/SW [place], RN Phone Number: 12/06/2021, 11:21 AM  Clinical Narrative:   talked to  The patient and her husband at the bedside, they do not want to return to [facility], They would like a bed at [facility], I reached out to [place] at [place] to [facility] if they have a bed, awaiting a response, Their plan is to go to [facility] SNF for a couple or 3 weeks for strengthening  then return home   Expected Discharge Plan: [place] Barriers to Discharge: Continued Medical Work up  Expected Discharge Plan and Services Expected Discharge Plan: [place] Choice: Resumption of Svcs/PTA Provider Living arrangements for the past 2 months: [place]                                       Social Determinants of Health (SDOH) Interventions    Readmission Risk Interventions Readmission Risk Prevention Plan 12/03/2021  Transportation Screening Complete  Medication Review Press photographer) Complete  PCP or Specialist appointment within 3-5 days of discharge Complete  SW Recovery Care/Counseling Consult Complete  [place] Complete  Some recent data might be hidden

## 2021-12-06 NOTE — Progress Notes (Signed)
Date of Admission:  12/02/2021     ID: Heather Lopez is a 76 y.o. femalePrincipal Problem:   UTI (urinary tract infection) Active Problems:   Acute kidney injury superimposed on CKD (HCC)   Duodenal ulcer perforation (HCC)   Essential hypertension   Sacral decubitus ulcer   Acute osteomyelitis of coccyx (HCC)   Hyperkalemia   Hyponatremia   Hyperglycemia due to diabetes mellitus (HCC)   Elevated alkaline phosphatase level   Elevated lipase   Hypoalbuminemia due to protein-calorie malnutrition (HCC)   Obesity (BMI 30-39.9)   Husband at bed side Spoke to him about the treatment she had for the sacral decub She had debridement of the ulcer at [facility] and started IV antibiotics and got for 6 weeks She ahs wound vac now Subjective: Feeling better No fever or chills No abdominal pain Eating well Chronic rt hip pain  Medications:   amoxicillin  1,000 mg Oral TID   atorvastatin  20 mg Oral Daily   buPROPion  100 mg Oral Daily   busPIRone  5 mg Oral Daily   Chlorhexidine Gluconate Cloth  6 each Topical Daily   cloNIDine  0.1 mg Oral BID   enoxaparin (LOVENOX) injection  40 mg Subcutaneous Q24H   feeding supplement  237 mL Oral TID BM   fluticasone  1 spray Each Nare Daily   insulin aspart  0-15 Units Subcutaneous TID WC   insulin aspart  0-5 Units Subcutaneous QHS   insulin glargine-yfgn  10 Units Subcutaneous BID   metoprolol tartrate  100 mg Oral BID   mirtazapine  15 mg Oral QHS    morphine injection  4 mg Intravenous Once   multivitamin with minerals  1 tablet Oral Daily   nystatin  1 application Topical BID   pantoprazole  40 mg Oral Daily   senna-docusate  1 tablet Oral BID    Objective: Vital signs in last 24 hours: Temp:  [97.7 F (36.5 C)-98.4 F (36.9 C)] 98.3 F (36.8 C) (01/11 0759) Pulse Rate:  [63-76] 63 (01/11 0759) Resp:  [18-20] 20 (01/11 0759) BP: (123-145)/(46-59) 144/53 (01/11 0759) SpO2:  [93 %-98 %] 96 % (01/11 0759)  PHYSICAL EXAM:   General: Alert, cooperative, no distress, deconditioned Lungs: b/l air entry Heart: Regular rate and rhythm, no murmur, rub or gallop. Abdomen: Soft, non-tender,not distended. Bowel sounds normal. No masses Foley Sacral decubitus- clean granulating tissue- does not probe to bone- 3 cm Surrounding skin has some maceration   Extremities: atraumatic, no cyanosis. No edema. No clubbing Skin: No rashes or lesions. Or bruising Lymph: Cervical, supraclavicular normal. Neurologic: Grossly non-focal  Lab Results Recent Labs    12/04/21 0500 12/05/21 0627 12/06/21 0557  WBC 11.6*  --   --   HGB 9.7*  --   --   HCT 30.9*  --   --   NA 138  --   --   K 3.8  --   --   CL 109  --   --   CO2 22  --   --   BUN 43*  --   --   CREATININE 1.64* 1.34* 1.52*   Microbiology: 12/02/21 UC proteus BC NG  Studies/Results: Ultrasound done on 12/03/21- b/l hydronephrosis has resolved   Assessment/Plan:  Complicated UTI due to urinary retention leading to b/l hydronephrosis with underlying b/l ureteral stents Proteus  in urine culture PT got 4 days of Iv antibiotic and lost IV access- changed to amoxicillin 1 gram  PO tid until 12/12/21  Patient has a history of Candida glabrata pyonephrosis and perirenal abscess on the left.  She had left PUJ stone, has b/l  stents  since August 2022 Had a drain for the left perirenal abscess which was present for 3 months and fell off in Dec No residual abscess  Pt needs to be seen by urologist for stone management and stent removal/exchange Communicated with Dr.Stoioff- he will do the procedure next Tuesday ( 12/12/21), but husband not happy to be sent to [facility] without procedure-   Sacral decubitus - stage IV- now a III- not infected- clean granualting tissue- She has already received 6 weeks of IV at [facility] Continue wound vac and follow with wound care  Prolonged hospitalization 03/01/94-0/7/22 Complicated stay in ICU intubated , perforated duodenal ulcer ,  surgery on 06/30/21, AKI was on Hemodialysis,  left hydronephrosis  stenting on 07/05/21, candida glabarata in urine culture from both kidneys, then developed left renal/perirenal abscess and had a drain placed on 07/21/21.[name] Received fluconazole and then f IV amphotericin, followed by PO fluconazole PEG placement  Transferred to LTAC on 08/03/21  Went to [facility] in oct 2022   Discussed the management with patient, husband and the care team Pt seen with hospitalist and her nurse

## 2021-12-06 NOTE — Progress Notes (Signed)
OT Cancellation Note  Patient Details Name: Heather Lopez MRN: [id number] DOB: Dec 30, 1945   Cancelled Treatment:    Reason Eval/Treat Not Completed: Patient at procedure or test/ unavailable;Other (comment). OT attempted to see pt x2 this date. On initial attempt pt declines any therapy 2/2 stomach ache and fatigue. Agreeable to OT re-attempting at 1500 with PT to facilitate mobility attempt. Upon return to room at 1500 with PT, session interrupted by PA students who report surgeon to present to bedside shortly. Pt begins to ask PA students questions regarding her care; no longer motivated for further mobility. Pt left with PA students in room.   [name], M.S., OTR/L [facility] - [facility] Ascom: [phone] 12/06/21, 3:24 PM

## 2021-12-07 ENCOUNTER — Encounter: Payer: Self-pay | Admitting: Internal Medicine

## 2021-12-07 LAB — CULTURE, BLOOD (ROUTINE X 2)
Culture: NO GROWTH
Special Requests: ADEQUATE

## 2021-12-07 LAB — GLUCOSE, CAPILLARY
Glucose-Capillary: 201 mg/dL — ABNORMAL HIGH (ref 70–99)
Glucose-Capillary: 232 mg/dL — ABNORMAL HIGH (ref 70–99)
Glucose-Capillary: 142 mg/dL — ABNORMAL HIGH (ref 70–99)

## 2021-12-07 MED ORDER — HEPARIN SODIUM (PORCINE) 5000 UNIT/ML IJ SOLN
5000.0000 [IU] | Freq: Three times a day (TID) | INTRAMUSCULAR | Status: AC
  Administered 2021-12-07 (×2): 5000 [IU] via SUBCUTANEOUS
  Filled 2021-12-07 (×2): qty 1

## 2021-12-07 MED ORDER — JUVEN PO PACK
1.0000 | PACK | Freq: Two times a day (BID) | ORAL | Status: DC
  Administered 2021-12-07 – 2021-12-09 (×2): 1 via ORAL

## 2021-12-07 MED ORDER — PROSOURCE PLUS PO LIQD
30.0000 mL | Freq: Three times a day (TID) | ORAL | Status: DC
  Administered 2021-12-07 – 2021-12-11 (×3): 30 mL via ORAL
  Filled 2021-12-07 (×14): qty 30

## 2021-12-07 NOTE — Progress Notes (Signed)
Date of Admission:  12/02/2021     ID: Heather Lopez is a 76 y.o. femalePrincipal Problem:   UTI (urinary tract infection) Active Problems:   Acute kidney injury superimposed on CKD (HCC)   Duodenal ulcer perforation (HCC)   Essential hypertension   Sacral decubitus ulcer   Acute osteomyelitis of coccyx (HCC)   Hyperkalemia   Hyponatremia   Hyperglycemia due to diabetes mellitus (HCC)   Elevated alkaline phosphatase level   Elevated lipase   Hypoalbuminemia due to protein-calorie malnutrition (HCC)   Obesity (BMI 30-39.9)  Subjective: Feeling much better No fever or chills Appetite good Awaiting urological procedure tomorrow  Medications:   (feeding supplement) PROSource Plus  30 mL Oral TID BM   atorvastatin  20 mg Oral Daily   buPROPion  100 mg Oral Daily   busPIRone  5 mg Oral Daily   Chlorhexidine Gluconate Cloth  6 each Topical Daily   cloNIDine  0.1 mg Oral BID   fluticasone  1 spray Each Nare Daily   heparin injection (subcutaneous)  5,000 Units Subcutaneous Q8H   insulin aspart  0-15 Units Subcutaneous TID WC   insulin aspart  0-5 Units Subcutaneous QHS   insulin glargine-yfgn  10 Units Subcutaneous BID   metoprolol tartrate  100 mg Oral BID   mirtazapine  15 mg Oral QHS    morphine injection  4 mg Intravenous Once   multivitamin with minerals  1 tablet Oral Daily   nutrition supplement (JUVEN)  1 packet Oral BID BM   nystatin  1 application Topical BID   pantoprazole  40 mg Oral Daily   senna-docusate  1 tablet Oral BID    Objective: Vital signs in last 24 hours: Temp:  [97.5 F (36.4 C)-98.7 F (37.1 C)] 97.5 F (36.4 C) (01/12 1215) Pulse Rate:  [60-84] 67 (01/12 1215) Resp:  [17-18] 17 (01/12 1215) BP: (112-130)/(46-60) 112/60 (01/12 1215) SpO2:  [95 %-100 %] 95 % (01/12 1215)  PHYSICAL EXAM:  General: Alert, cooperative, no distress,  Lungs: b/l air entry Heart: s1s2 Abdomen: Soft, non-tender,not distended. Bowel sounds normal. No  masses Foley Sacral decubitus- covered with wound vac clean granulating tissue- does not probe to bone- 3 cm Surrounding skin has some maceration   Extremities: atraumatic, no cyanosis. No edema. No clubbing Skin: No rashes or lesions. Or bruising Lymph: Cervical, supraclavicular normal. Neurologic: Grossly non-focal  Lab Results Recent Labs    12/05/21 0627 12/06/21 0557  CREATININE 1.34* 1.52*   Microbiology: 12/02/21 UC proteus BC NG  Studies/Results: Ultrasound done on 12/03/21- b/l hydronephrosis has resolved   Assessment/Plan:  Complicated UTI due to urinary retention leading to b/l hydronephrosis with underlying b/l ureteral stents Proteus  in urine culture Pt is on IV ampicillin- On discharge change to amoxicillin . End of therapy 12/12/21.  Awaiting stone removal and stent removal/exchange tomorrow  Patient has a history of Candida glabrata pyonephrosis and perirenal abscess on the left.  She had left PUJ stone, has b/l  stents  since August 2022 Had a drain for the left perirenal abscess which was present for 3 months and fell off in Dec No residual abscess    Sacral decubitus - stage IV- now a III- not infected- clean granualting tissue- She has already received 6 weeks of IV at [facility] Continue wound vac and follow with wound care  Prolonged hospitalization 05/27/52-05/02/43 Complicated stay in ICU intubated , perforated duodenal ulcer , surgery on 06/30/21, AKI was on Hemodialysis,  left hydronephrosis  stenting on 07/05/21, candida glabarata in urine culture from both kidneys, then developed left renal/perirenal abscess and had a drain placed on 07/21/21.[name] Received fluconazole and then f IV amphotericin, followed by PO fluconazole PEG placement  Transferred to LTAC on 08/03/21  Went to [facility] in oct 2022   Discussed the management with patient,  ID will sign off- call if needed

## 2021-12-07 NOTE — Progress Notes (Signed)
Physical Therapy Treatment Patient Details Name: Heather Lopez MRN: [id number] DOB: [birth year] Today's Date: 12/07/2021   History of Present Illness Patient is a 76 year old female who presented to [facility] ED yesterday 12/02/21 with reports of lower abdominal pain and a fever. Patient was admitted with a UTI. Patient has a PMH (+) for hypertension, diabetes, hyperlipidemia, prior B/L ureteral stents, Left UPJ calculus, Right hydronephrosis, duodenal ulcer and obesity    PT Comments    Pt received in bed for co-treat with OT. Pt agreeable to treatment. Does require encouragement throughout session to participate and active listening throughout session. Pt overall requires modA+2 to transfer to sitting EOB with increased time and HOB elevated with cuing for sequencing LE's/UE's. Pt requires at least SUE support on EOB for fair static sitting for UE/LE there.ex. Pt tolerating ~15 minutes of sitting with intermittent TC's on torso during therex for balance requesting to return to supine in bed with modA+2. Bed mobility performing for donning clean chuck pad underneath pt with rolling R/L with minA+1 and maxA+2 to scoot up in bed. All needs within reach. D/c recs remain appropriate as pt is unable to perform standing and transfers which is patient's baseline.    Recommendations for follow up therapy are one component of a multi-disciplinary discharge planning process, led by the attending physician.  Recommendations may be updated based on patient status, additional functional criteria and insurance authorization.  Follow Up Recommendations  Skilled nursing-short term rehab (<3 hours/day)     Assistance Recommended at Discharge Frequent or constant Supervision/Assistance  Patient can return home with the following Assistance with cooking/housework;A lot of help with bathing/dressing/bathroom;A lot of help with walking and/or transfers;Two people to help with bathing/dressing/bathroom;Direct supervision/assist  for financial management;Direct supervision/assist for medications management;Assist for transportation;Help with stairs or ramp for entrance;Two people to help with walking and/or transfers   Equipment Recommendations  Rolling walker (2 wheels)    Recommendations for Other Services       Precautions / Restrictions Precautions Precautions: Fall Restrictions Weight Bearing Restrictions: No     Mobility  Bed Mobility Overal bed mobility: Needs Assistance Bed Mobility: Supine to Sit;Sit to Supine     Supine to sit: Mod assist;+2 for physical assistance Sit to supine: Mod assist;+2 for physical assistance     Patient Response: Cooperative  Transfers Overall transfer level: Needs assistance                 General transfer comment: pt declined assessment    Ambulation/Gait                   Stairs             Wheelchair Mobility    Modified Rankin (Stroke Patients Only)       Balance Overall balance assessment: Needs assistance Sitting-balance support: Feet supported Sitting balance-Leahy Scale: Good   Postural control: Left lateral lean (in sitting for LUE support on bed/bedrail)                                  Cognition Arousal/Alertness: Awake/alert Behavior During Therapy: WFL for tasks assessed/performed Overall Cognitive Status: Within Functional Limits for tasks assessed  Exercises General Exercises - Upper Extremity Shoulder Flexion: AROM;5 reps (2 sets) General Exercises - Lower Extremity Ankle Circles/Pumps: AROM;Seated;Strengthening;Both;15 reps Long Arc Quad: AROM;Seated;Strengthening;Both;10 reps Hip Flexion/Marching: AROM;Seated;Strengthening;Both;10 reps Other Exercises Other Exercises: hands open/shut 10x to decrease edema Other Exercises: education re: FMC/dexterity tasks with theraputty to improve B hand use    General Comments General  comments (skin integrity, edema, etc.): pt positioned on L side at completion of session for skin integrity; sacral wound vac; Redness/ open area noted over R hip, RN notified and states it is a tape blister from where wound vac was removed yesterday; mild edema in R hand      Pertinent Vitals/Pain Pain Assessment: Faces Faces Pain Scale: Hurts whole lot Pain Location: B hands, R hip Pain Descriptors / Indicators: Constant;Jabbing;Discomfort Pain Intervention(s): Limited activity within patient's tolerance;Monitored during session;Repositioned    Home Living                          Prior Function            PT Goals (current goals can now be found in the care plan section) Acute Rehab PT Goals Patient Stated Goal: to get better and get home PT Goal Formulation: With patient Time For Goal Achievement: 12/17/21 Potential to Achieve Goals: Poor Progress towards PT goals: Not progressing toward goals - comment    Frequency    Min 2X/week      PT Plan Current plan remains appropriate    Co-evaluation   Reason for Co-Treatment: Complexity of the patient's impairments (multi-system involvement);For patient/therapist safety;To address functional/ADL transfers   OT goals addressed during session: ADL's and self-care      AM-PAC PT "6 Clicks" Mobility   Outcome Measure  Help needed turning from your back to your side while in a flat bed without using bedrails?: A Lot Help needed moving from lying on your back to sitting on the side of a flat bed without using bedrails?: Total Help needed moving to and from a bed to a chair (including a wheelchair)?: Total Help needed standing up from a chair using your arms (e.g., wheelchair or bedside chair)?: Total Help needed to walk in hospital room?: Total Help needed climbing 3-5 steps with a railing? : Total 6 Click Score: 7    End of Session   Activity Tolerance: Patient limited by pain Patient left: in bed;with call  bell/phone within reach;with bed alarm set;with family/visitor present Nurse Communication: Mobility status PT Visit Diagnosis: Muscle weakness (generalized) (M62.81);Unsteadiness on feet (R26.81);Difficulty in walking, not elsewhere classified (R26.2);Repeated falls (R29.6)     Time: 2992-4268 PT Time Calculation (min) (ACUTE ONLY): 27 min  Charges:  $Therapeutic Exercise: 8-22 mins                    Salem Caster. Fairly IV, PT, DPT Physical Therapist- [facility]  12/07/2021, 1:40 PM

## 2021-12-07 NOTE — Plan of Care (Signed)

## 2021-12-07 NOTE — TOC Progression Note (Signed)
Transition of Care [facility]) - Progression Note    Patient Details  Name: Heather Lopez MRN: [id number] Date of Birth: 1946/05/06  Transition of Care [facility]) CM/SW [place], RN Phone Number: 12/07/2021, 1:16 PM  Clinical Narrative:   Patient will have procedure tomorrow and will need a STR SNF bed, I sent the search out to [facility], no bed offers at this time, will review offers once obtained    Expected Discharge Plan: [place] Barriers to Discharge: Continued Medical Work up  Expected Discharge Plan and Services Expected Discharge Plan: [place] Acute Care Choice: Resumption of Svcs/PTA Provider Living arrangements for the past 2 months: [place]                                       Social Determinants of Health (SDOH) Interventions    Readmission Risk Interventions Readmission Risk Prevention Plan 12/03/2021  Transportation Screening Complete  Medication Review Press photographer) Complete  PCP or Specialist appointment within 3-5 days of discharge Complete  SW Recovery Care/Counseling Consult Complete  [place] Complete  Some recent data might be hidden

## 2021-12-07 NOTE — Progress Notes (Signed)
PHARMACY - PHYSICIAN COMMUNICATION CRITICAL VALUE ALERT - BLOOD CULTURE IDENTIFICATION (BCID)  BCID results from lab: 1 (aerobic) of 3 vials with GPRs, sent to [facility] for further testing.  Pt already on ampicillin for UTI w/ Proteus Mirabilis per Dr. Delaine Lame. Pt afebrile for past 24hr.  Name of provider contacted: Neomia Glass, NP  Changes to prescribed antibiotics required: No changes at this time  Renda Rolls, PharmD, [facility] 12/07/2021 6:46 AM

## 2021-12-07 NOTE — Progress Notes (Signed)
Nutrition Follow-up  DOCUMENTATION CODES:   Obesity unspecified  INTERVENTION:   -D/c Ensure Enlive -Liberalize diet to regular to increase variety of meal selections to improve oral intake -Continue Mighty Shake BID with meals. Each supplement provides 220 kcals and 6 grams protein -30 ml Prosource Plus TID, each supplement provides 100 kcals and 15 grams protein -1 packet Juven BID, each packet provides 95 calories, 2.5 grams of protein (collagen), and 9.8 grams of carbohydrate (3 grams sugar); also contains 7 grams of L-arginine and L-glutamine, 300 mg vitamin C, 15 mg vitamin Heather, 1.2 mcg vitamin B-12, 9.5 mg zinc, 200 mg calcium, and 1.5 g  Calcium Beta-hydroxy-Beta-methylbutyrate to support wound healing   NUTRITION DIAGNOSIS:   Increased nutrient needs related to wound healing as evidenced by estimated needs.  Ongoing  GOAL:   Patient will meet greater than or equal to 90% of their needs  Progressing   MONITOR:   PO intake, Supplement acceptance, Labs, Weight trends, Skin, I & O's  REASON FOR ASSESSMENT:   Consult Assessment of nutrition requirement/status  ASSESSMENT:   Heather Lopez is a 76 y.o. female with medical history significant for  HTN, HLD, T2DM, Prior B/L ureteral stents, Left UPJ calculus, Right hydronephrosis, duodenal ulcer, obesity who presents to the emergency department due to several days of intermittent lower abdominal pain which rapidly worsened last night, pain was described as dull, constant and with sensation of squeezing of the intestines, it was rated as 9/10 on pain scale with no known alleviating/aggravating factors, she complained of nausea without vomiting.  Patient states that she has been intermittently passing some small stones.  Reviewed I/O's: -1.3 L x 24 hours and -1.7 L since admission  UOP: 1.5 L x 24 hours  Pt unavailable at time of visit. Attempted to speak with pt via call to hospital room phone, however, unable to reach.   Pt  remains with variable appetite. Noted meal completions 25-100%. Pt is refusing Ensure Enlive supplements. Pt is currently on a heart healthy diet, but will liberalize diet in attempt to promote goos oral intake to help promote wound healing.   Medications reviewed and include remeron and senokot.   Labs reviewed: CBGS: 84-201 (inpatient orders for glycemic control are 0-15 units insulin aspart TID with meals, 0-5 units insulin aspart daily at bedtime, and 10 units insulin glargine-yfgn BID).    Diet Order:   Diet Order             Diet NPO time specified  Diet effective midnight           Diet Heart Room service appropriate? Yes; Fluid consistency: Thin  Diet effective now                   EDUCATION NEEDS:   No education needs have been identified at this time  Skin:  Skin Assessment: Skin Integrity Issues: Skin Integrity Issues:: Wound VAC Stage IV: sacrum Wound Vac: stahe IV sacrum  Last BM:  12/06/21  Height:   Ht Readings from Last 1 Encounters:  12/02/21 5\' 3"  (1.6 m)    Weight:   Wt Readings from Last 1 Encounters:  12/02/21 87.2 kg    Ideal Body Weight:  52.3 kg  BMI:  Body mass index is 34.06 kg/m.  Estimated Nutritional Needs:   Kcal:  1800-2000  Protein:  105-120 grams  Fluid:  > 1.8 L    Loistine Chance, RD, LDN, [place] Registered Dietitian II Certified Diabetes Care and Education Specialist  Please refer to [facility] for RD and/or RD on-call/weekend/after hours pager

## 2021-12-07 NOTE — Progress Notes (Signed)
[place] [facility] [place] NAME: Heather Lopez    MR#:  [id number]  DATE OF BIRTH:  05-Feb-1946  SUBJECTIVE:  patient's husband at bedside. No new issues. Patient will be NPO after midnight she understands that.                                                                                                    REVIEW OF SYSTEMS:   Review of Systems  Constitutional:  Positive for malaise/fatigue. Negative for chills, fever and weight loss.  HENT:  Negative for ear discharge, ear pain and nosebleeds.   Eyes:  Negative for blurred vision, pain and discharge.  Respiratory:  Negative for sputum production, shortness of breath, wheezing and stridor.   Cardiovascular:  Negative for chest pain, palpitations, orthopnea and PND.  Gastrointestinal:  Negative for abdominal pain, diarrhea, nausea and vomiting.  Genitourinary:  Negative for frequency and urgency.  Musculoskeletal:  Negative for back pain and joint pain.  Neurological:  Positive for weakness. Negative for sensory change, speech change and focal weakness.  Psychiatric/Behavioral:  Negative for depression and hallucinations. The patient is not nervous/anxious.   Tolerating Diet:yes Tolerating PT: rehab  DRUG ALLERGIES:   Allergies  Allergen Reactions   Contrast Media  [Iodinated Contrast Media] Anaphylaxis   Iodine Swelling    (IV only) - angioedema    VITALS:  Blood pressure 112/60, pulse 67, temperature (!) 97.5 F (36.4 C), resp. rate 17, height 5\' 3"  (1.6 m), weight 87.2 kg, SpO2 95 %.  PHYSICAL EXAMINATION:   Physical Exam  GENERAL:  76 y.o.-year-old patient lying in the bed with no acute distress. sheis deconditioned HEENT: Head atraumatic, normocephalic. Oropharynx and nasopharynx clear.  LUNGS: Normal breath sounds bilaterally, no wheezing, rales, rhonchi. No use of accessory muscles of respiration.  CARDIOVASCULAR: S1, S2 normal. No murmurs, rubs, or gallops.  ABDOMEN: Soft,  nontender, nondistended. Bowel sounds present. EXTREMITIES: edema+ mild  NEUROLOGIC: nonfocal PSYCHIATRIC:  patient alert and awake SKIN:   Pressure Injury 07/07/21 Buttocks Stage 2 -  Partial thickness loss of dermis presenting as a shallow open injury with a red, pink wound bed without slough. (Active)  07/07/21 0800  Location: Buttocks  Location Orientation:   Staging: Stage 2 -  Partial thickness loss of dermis presenting as a shallow open injury with a red, pink wound bed without slough.  Wound Description (Comments):   Present on Admission: No     Pressure Injury 07/24/21 Coccyx Mid Unstageable - Full thickness tissue loss in which the base of the injury is covered by slough (yellow, tan, gray, green or brown) and/or eschar (tan, brown or black) in the wound bed. Unstageable Sacral Wound (Active)  07/24/21 0000  Location: Coccyx  Location Orientation: Mid  Staging: Unstageable - Full thickness tissue loss in which the base of the injury is covered by slough (yellow, tan, gray, green or brown) and/or eschar (tan, brown or black) in the wound bed.  Wound Description (Comments): Unstageable Sacral Wound  Present on Admission:      Pressure Injury  07/30/21 Heel Right Stage 1 -  Intact skin with non-blanchable redness of a localized area usually over a bony prominence. (Active)  07/30/21 0730  Location: Heel  Location Orientation: Right  Staging: Stage 1 -  Intact skin with non-blanchable redness of a localized area usually over a bony prominence.  Wound Description (Comments):   Present on Admission:      Pressure Injury 12/03/21 Sacrum Mid Stage 4 - Full thickness tissue loss with exposed bone, tendon or muscle. (Active)  12/03/21 0200  Location: Sacrum  Location Orientation: Mid  Staging: Stage 4 - Full thickness tissue loss with exposed bone, tendon or muscle.  Wound Description (Comments):   Present on Admission: Yes        LABORATORY PANEL:  CBC Recent Labs  Lab  12/04/21 0500  WBC 11.6*  HGB 9.7*  HCT 30.9*  PLT 215     Chemistries  Recent Labs  Lab 12/03/21 0451 12/04/21 0500 12/05/21 0627 12/06/21 0557  NA 137 138  --   --   K 5.1 3.8  --   --   CL 111 109  --   --   CO2 15* 22  --   --   GLUCOSE 170* 107*  --   --   BUN 51* 43*  --   --   CREATININE 2.00* 1.64*   < > 1.52*  CALCIUM 9.7 9.2  --   --   MG 1.5*  --   --   --   AST 11*  --   --   --   ALT 7  --   --   --   ALKPHOS 134*  --   --   --   BILITOT 0.7  --   --   --    < > = values in this interval not displayed.    Cardiac Enzymes No results for input(s): TROPONINI in the last 168 hours. RADIOLOGY:  No results found. ASSESSMENT AND PLAN:   76 yo female with the past medical history of HTN, dyslipidemia, T2DM, sp bilateral ureteral stents, left UPJ calculus, right hydronephrosis, pelvic abscess, candida urine infection, obesity and duodenal ulcer who presented with pelvic pain. Reported worsening lower abdominal pain, for the last several days, more intense over 24 hours leading into her hospitalization  She has a chronic indwelling foley cathter summer 2022 and chronic right upper extremity PICC line fall 2022.   --Urology exchanged Foley.  Initial bilateral hydronephrosis seen on imaging has completely resolved on follow-up renal ultrasound.   --General surgery engaged for evaluation of sacral wound.  -- Wound VAC applied at bedside.  Per last general surgery note wound checks with dressing change Monday Wednesday Friday.  Severe sepsis due to complicated urinary tract infection (obstructive uropathy and nephrolithiasis), present on admission, end organ failure AKI/ acute metabolic encephalopathy.  Urine infection associated with chronic indwelling foley catheter. --Patient remains somnolent --Leukocytosis improving --Urine culture positive for Proteus mirabilis --Blood cultures no growth to date --Foley catheter has been exchanged, appreciate urology  consultation. Per urology patient will need bilateral stent exchange as outpatient --Follow-up renal ultrasound shows resolution of hydronephrosis -- patient currently on IV Rocephin. ID managing IV antibiotics. Issues with right upper extremity PICC line. Awaiting to see final recommendation regarding antibiotic. If IV antibiotic is recommended patient will need another PICC line --1/11-- will continue IV ampicillin through peripheral line. Discussed with urology patient will get her stone treatment and stent exchanged on Friday by Dr  sninsky. --1/12-- no new issues. NPO after midnight tonight   AKI on CKD sage 3b, obstructive uropathy, bilateral hydronephrosis, hyperkalemia, non anion gap metabolic acidosis. Hyponatremia.  --Foley catheter has been exchanged. -Improvement in renal function and metabolic parameters --DC bicarbonate infusion --Avoid hypotension and nephrotoxins --good UOP. D/c IVF for now   Stage IV sacrum pressure ulcer, chronic coccygeal osteomyelitis.  --Continue local wound care per surgery consultation. --Wound VAC in place--wound looks better --General surgery follow-up, wound checks MWF   T2DM/ dyslipidemia --Continue glucose cover and monitoring with insulin sliding scale.  --Basal insulin with levemir 10 units bid --Continue with satin therapy with atorvastatin.    HTN  --Restarting home regimen withMetoprolol,Clonidine --As needed IV hydralazine   Obesity class 2 -- calculated BMI is 35,9    Mild protein calorie malnutrition/ non ambulatory state.  Cont RD recs. Per husband pt eating better     DVT prophylaxis: SQ Lovenox Code Status: DNR Family Communication: husband at bedside Disposition Plan: Status is: Inpatient   Remains inpatient appropriate because: Severe sepsis, AKI secondary to obstructive uropathy and UTI.  Renal parameters improving.   Pt to get urological procedure on Friday by Dr Diamantina Providence. D/c later next week once rehab available             TOTAL TIME TAKING CARE OF THIS PATIENT: 25 minutes.  >50% time spent on counselling and coordination of care  Note: This dictation was prepared with Dragon dictation along with smaller phrase technology. Any transcriptional errors that result from this process are unintentional.  Fritzi Mandes M.D    [facility]   CC: Primary care physician; [name], PA-C Patient ID: Heather Lopez, female   DOB: [birth year], 76 y.o.   MRN: [id number]

## 2021-12-07 NOTE — Progress Notes (Signed)
Occupational Therapy Treatment Patient Details Name: Heather Lopez MRN: [id number] DOB: [birth year] Today's Date: 12/07/2021   History of present illness Patient is a 76 year old female who presented to [facility] ED yesterday 12/02/21 with reports of lower abdominal pain and a fever. Patient was admitted with a UTI. Patient has a PMH (+) for hypertension, diabetes, hyperlipidemia, prior B/L ureteral stents, Left UPJ calculus, Right hydronephrosis, duodenal ulcer and obesity   OT comments  Chart reviewed, pt seen today for Co-tx with PT. Pt greeted in bed, agreeable to tx session. Tx session targeted progressing functional mobility, strength, BUE function in preparation for increased independence while completing ADLs. Pt required MOD A for bed mobility, sat at EOB for ~15 minutes with good sitting balance. Education provided re: HEP, importance of mobility, positioning for edema. Pt appears well aware of deficits and appears motivated to continue to progress. Pt appears to be progressing towards goal acquisition, however continues to perform below PLOF. Pt will continue to benefit from skilled OT to facilitate return to PLOF and optimal safe, independent ADL/IADL completion.    Recommendations for follow up therapy are one component of a multi-disciplinary discharge planning process, led by the attending physician.  Recommendations may be updated based on patient status, additional functional criteria and insurance authorization.    Follow Up Recommendations  Skilled nursing-short term rehab (<3 hours/day)    Assistance Recommended at Discharge Frequent or constant Supervision/Assistance  Patient can return home with the following  A lot of help with walking and/or transfers;A lot of help with bathing/dressing/bathroom;Assistance with cooking/housework;Assist for transportation;Help with stairs or ramp for entrance   Equipment Recommendations  BSC/3in1    Recommendations for Other Services       Precautions / Restrictions         Mobility Bed Mobility Overal bed mobility: Needs Assistance Bed Mobility: Supine to Sit;Sit to Supine     Supine to sit: Mod assist;+2 for physical assistance Sit to supine: Mod assist;+2 for physical assistance        Transfers Overall transfer level: Needs assistance                       Balance Overall balance assessment: Needs assistance Sitting-balance support: Feet supported Sitting balance-Leahy Scale: Good                                     ADL either performed or assessed with clinical judgement   ADL Overall ADL's : Needs assistance/impaired     Grooming: Set up               Lower Body Dressing: Maximal assistance;Bed level                       Cognition Arousal/Alertness: Awake/alert   Overall Cognitive Status: Within Functional Limits for tasks assessed                                            Exercises General Exercises - Upper Extremity Shoulder Flexion: AROM;5 reps (2 sets) Other Exercises Other Exercises: hands open/shut 10x to decrease edema Other Exercises: education re: FMC/dexterity tasks with theraputty to improve B hand use      General Comments pt positioned on L side at completion of  session for skin integrity; sacral wound vac; Redness/ open area noted over R hip, RN notified and states it is a tape blister from where wound vac was removed yesterday; mild edema in R hand    Pertinent Vitals/ Pain       Pain Assessment: Faces Faces Pain Scale: Hurts whole lot Pain Location: B hands, R hip Pain Descriptors / Indicators: Constant;Jabbing;Discomfort Pain Intervention(s): Limited activity within patient's tolerance;Monitored during session;Repositioned   Frequency  Min 2X/week        Progress Toward Goals  OT Goals(current goals can now be found in the care plan section)  Progress towards OT goals: Progressing toward  goals  Acute Rehab OT Goals Patient Stated Goal: return to working OT Goal Formulation: With patient/family Time For Goal Achievement: 12/21/21 Potential to Achieve Goals: Good  Plan Discharge plan remains appropriate    Co-evaluation    PT/OT/SLP Co-Evaluation/Treatment: Yes Reason for Co-Treatment: Complexity of the patient's impairments (multi-system involvement);For patient/therapist safety;To address functional/ADL transfers   OT goals addressed during session: ADL's and self-care      AM-PAC OT "6 Clicks" Daily Activity     Outcome Measure   Help from another person eating meals?: None Help from another person taking care of personal grooming?: None Help from another person toileting, which includes using toliet, bedpan, or urinal?: A Lot Help from another person bathing (including washing, rinsing, drying)?: A Lot Help from another person to put on and taking off regular upper body clothing?: A Little Help from another person to put on and taking off regular lower body clothing?: A Lot 6 Click Score: 17    End of Session    OT Visit Diagnosis: Muscle weakness (generalized) (M62.81);Unsteadiness on feet (R26.81)   Activity Tolerance Patient tolerated treatment well   Patient Left in bed;with call bell/phone within reach;with bed alarm set;with family/visitor present   Nurse Communication Mobility status;Other (comment) (R hip skin integrity)        Time: 9470-9628 OT Time Calculation (min): 27 min  Charges: OT General Charges $OT Visit: 1 Visit OT Treatments $Therapeutic Activity: 8-22 mins  Shanon Payor, OTD OTR/L  12/07/21, 1:34 PM

## 2021-12-08 ENCOUNTER — Encounter
Admission: EM | Disposition: A | Discharge status: Home Health | Payer: Self-pay | Source: Home / Self Care | Attending: Internal Medicine

## 2021-12-08 ENCOUNTER — Inpatient Hospital Stay: Payer: Medicare Other

## 2021-12-08 ENCOUNTER — Inpatient Hospital Stay: Payer: Medicare Other | Admitting: Anesthesiology

## 2021-12-08 ENCOUNTER — Encounter: Payer: Self-pay | Admitting: Internal Medicine

## 2021-12-08 DIAGNOSIS — N200 Calculus of kidney: Secondary | ICD-10-CM

## 2021-12-08 HISTORY — PX: CYSTOSCOPY/URETEROSCOPY/HOLMIUM LASER/STENT PLACEMENT: SHX6546

## 2021-12-08 LAB — GLUCOSE, CAPILLARY
Glucose-Capillary: 104 mg/dL — ABNORMAL HIGH (ref 70–99)
Glucose-Capillary: 106 mg/dL — ABNORMAL HIGH (ref 70–99)
Glucose-Capillary: 121 mg/dL — ABNORMAL HIGH (ref 70–99)
Glucose-Capillary: 139 mg/dL — ABNORMAL HIGH (ref 70–99)
Glucose-Capillary: 364 mg/dL — ABNORMAL HIGH (ref 70–99)

## 2021-12-08 SURGERY — CYSTOSCOPY/URETEROSCOPY/HOLMIUM LASER/STENT PLACEMENT
Anesthesia: General | Laterality: Bilateral

## 2021-12-08 MED ORDER — MEPERIDINE HCL 25 MG/ML IJ SOLN
6.2500 mg | Freq: Once | INTRAMUSCULAR | Status: AC
  Administered 2021-12-08: 6.25 mg via INTRAVENOUS

## 2021-12-08 MED ORDER — SODIUM CHLORIDE FLUSH 0.9 % IV SOLN
INTRAVENOUS | Status: AC
  Filled 2021-12-08: qty 10

## 2021-12-08 MED ORDER — ROCURONIUM BROMIDE 100 MG/10ML IV SOLN
INTRAVENOUS | Status: DC | PRN
  Administered 2021-12-08 (×2): 10 mg via INTRAVENOUS
  Administered 2021-12-08: 40 mg via INTRAVENOUS

## 2021-12-08 MED ORDER — MORPHINE SULFATE (PF) 2 MG/ML IV SOLN
2.0000 mg | INTRAVENOUS | Status: DC | PRN
Start: 2021-12-08 — End: 2021-12-12
  Administered 2021-12-08: 2 mg via INTRAVENOUS
  Filled 2021-12-08: qty 1

## 2021-12-08 MED ORDER — DEXAMETHASONE SODIUM PHOSPHATE 10 MG/ML IJ SOLN
INTRAMUSCULAR | Status: AC
  Filled 2021-12-08: qty 1

## 2021-12-08 MED ORDER — PROPOFOL 10 MG/ML IV BOLUS
INTRAVENOUS | Status: DC | PRN
  Administered 2021-12-08: 120 mg via INTRAVENOUS

## 2021-12-08 MED ORDER — SODIUM CHLORIDE 0.9 % IR SOLN
Status: DC | PRN
  Administered 2021-12-08: 3000 mL

## 2021-12-08 MED ORDER — FENTANYL CITRATE (PF) 100 MCG/2ML IJ SOLN
INTRAMUSCULAR | Status: AC
  Filled 2021-12-08: qty 2

## 2021-12-08 MED ORDER — ONDANSETRON HCL 4 MG/2ML IJ SOLN
INTRAMUSCULAR | Status: AC
  Filled 2021-12-08: qty 2

## 2021-12-08 MED ORDER — SUGAMMADEX SODIUM 200 MG/2ML IV SOLN
INTRAVENOUS | Status: DC | PRN
  Administered 2021-12-08: 200 mg via INTRAVENOUS

## 2021-12-08 MED ORDER — IOHEXOL 180 MG/ML  SOLN
INTRAMUSCULAR | Status: DC | PRN
  Administered 2021-12-08: 5 mL via ORAL

## 2021-12-08 MED ORDER — FENTANYL CITRATE (PF) 100 MCG/2ML IJ SOLN
25.0000 ug | INTRAMUSCULAR | Status: DC | PRN
  Administered 2021-12-08: 50 ug via INTRAVENOUS
  Administered 2021-12-08 (×2): 25 ug via INTRAVENOUS
  Administered 2021-12-08: 50 ug via INTRAVENOUS

## 2021-12-08 MED ORDER — FENTANYL CITRATE (PF) 100 MCG/2ML IJ SOLN
INTRAMUSCULAR | Status: DC | PRN
  Administered 2021-12-08 (×4): 50 ug via INTRAVENOUS

## 2021-12-08 MED ORDER — DEXMEDETOMIDINE (PRECEDEX) IN NS 20 MCG/5ML (4 MCG/ML) IV SYRINGE
PREFILLED_SYRINGE | INTRAVENOUS | Status: DC | PRN
  Administered 2021-12-08: 8 ug via INTRAVENOUS
  Administered 2021-12-08: 12 ug via INTRAVENOUS

## 2021-12-08 MED ORDER — LIDOCAINE HCL (CARDIAC) PF 100 MG/5ML IV SOSY
PREFILLED_SYRINGE | INTRAVENOUS | Status: DC | PRN
  Administered 2021-12-08: 100 mg via INTRAVENOUS

## 2021-12-08 MED ORDER — SODIUM CHLORIDE 0.9 % IV SOLN: INTRAVENOUS | Status: DC | PRN

## 2021-12-08 MED ORDER — ROCURONIUM BROMIDE 10 MG/ML (PF) SYRINGE
PREFILLED_SYRINGE | INTRAVENOUS | Status: AC
  Filled 2021-12-08: qty 10

## 2021-12-08 MED ORDER — PHENYLEPHRINE HCL (PRESSORS) 10 MG/ML IV SOLN
INTRAVENOUS | Status: DC | PRN
Start: 2021-12-08 — End: 2021-12-08
  Administered 2021-12-08: 160 ug via INTRAVENOUS
  Administered 2021-12-08: 80 ug via INTRAVENOUS
  Administered 2021-12-08: 160 ug via INTRAVENOUS

## 2021-12-08 MED ORDER — LIDOCAINE HCL (PF) 2 % IJ SOLN
INTRAMUSCULAR | Status: AC
  Filled 2021-12-08: qty 5

## 2021-12-08 MED ORDER — ONDANSETRON HCL 4 MG/2ML IJ SOLN: 4.0000 mg | Freq: Once | INTRAMUSCULAR | Status: DC | PRN

## 2021-12-08 MED ORDER — DEXMEDETOMIDINE (PRECEDEX) IN NS 20 MCG/5ML (4 MCG/ML) IV SYRINGE
PREFILLED_SYRINGE | INTRAVENOUS | Status: AC
  Filled 2021-12-08: qty 5

## 2021-12-08 MED ORDER — ONDANSETRON HCL 4 MG/2ML IJ SOLN
INTRAMUSCULAR | Status: DC | PRN
  Administered 2021-12-08: 4 mg via INTRAVENOUS

## 2021-12-08 MED ORDER — DEXAMETHASONE SODIUM PHOSPHATE 10 MG/ML IJ SOLN
INTRAMUSCULAR | Status: DC | PRN
  Administered 2021-12-08: 10 mg via INTRAVENOUS

## 2021-12-08 MED ORDER — MEPERIDINE HCL 25 MG/ML IJ SOLN
INTRAMUSCULAR | Status: AC
  Filled 2021-12-08: qty 1

## 2021-12-08 SURGICAL SUPPLY — 33 items
ADHESIVE MASTISOL STRL (MISCELLANEOUS) IMPLANT
BAG DRAIN CYSTO-URO LG1000N (MISCELLANEOUS) ×2 IMPLANT
BRUSH SCRUB EZ 1% IODOPHOR (MISCELLANEOUS) ×2 IMPLANT
CATH FOL 2WAY LX 16X5 (CATHETERS) ×1 IMPLANT
CATH URET FLEX-TIP 2 LUMEN 10F (CATHETERS) IMPLANT
CATH URETL OPEN 5X70 (CATHETERS) IMPLANT
CNTNR SPEC 2.5X3XGRAD LEK (MISCELLANEOUS)
CONT SPEC 4OZ STER OR WHT (MISCELLANEOUS)
CONTAINER SPEC 2.5X3XGRAD LEK (MISCELLANEOUS) IMPLANT
DRAPE UTILITY 15X26 TOWEL STRL (DRAPES) ×2 IMPLANT
DRSG TEGADERM 2-3/8X2-3/4 SM (GAUZE/BANDAGES/DRESSINGS) IMPLANT
GAUZE 4X4 16PLY ~~LOC~~+RFID DBL (SPONGE) ×4 IMPLANT
GLOVE SURG UNDER POLY LF SZ7.5 (GLOVE) ×2 IMPLANT
GOWN STRL REUS W/ TWL LRG LVL3 (GOWN DISPOSABLE) ×1 IMPLANT
GOWN STRL REUS W/ TWL XL LVL3 (GOWN DISPOSABLE) ×1 IMPLANT
GOWN STRL REUS W/TWL LRG LVL3 (GOWN DISPOSABLE) ×1
GOWN STRL REUS W/TWL XL LVL3 (GOWN DISPOSABLE) ×1
GUIDEWIRE STR DUAL SENSOR (WIRE) ×2 IMPLANT
INFUSOR MANOMETER BAG 3000ML (MISCELLANEOUS) ×2 IMPLANT
IV NS IRRIG 3000ML ARTHROMATIC (IV SOLUTION) ×2 IMPLANT
KIT TURNOVER CYSTO (KITS) ×2 IMPLANT
Lumenis Moses ×1 IMPLANT
PACK CYSTO AR (MISCELLANEOUS) ×2 IMPLANT
SET CYSTO W/LG BORE CLAMP LF (SET/KITS/TRAYS/PACK) ×2 IMPLANT
SHEATH URETERAL 12FRX35CM (MISCELLANEOUS) ×1 IMPLANT
STENT URET 6FRX24 CONTOUR (STENTS) ×2 IMPLANT
STENT URET 6FRX26 CONTOUR (STENTS) IMPLANT
SURGILUBE 2OZ TUBE FLIPTOP (MISCELLANEOUS) ×2 IMPLANT
SYR 10ML LL (SYRINGE) ×2 IMPLANT
TRACTIP FLEXIVA PULSE ID 200 (Laser) IMPLANT
VALVE UROSEAL ADJ ENDO (VALVE) ×1 IMPLANT
WATER STERILE IRR 1000ML POUR (IV SOLUTION) ×2 IMPLANT
WATER STERILE IRR 500ML POUR (IV SOLUTION) ×2 IMPLANT

## 2021-12-08 NOTE — Care Management Important Message (Signed)
Important Message  Patient Details  Name: Heather Lopez MRN: [id number] Date of Birth: 15-Mar-1946   Medicare Important Message Given:  Yes     [name] 12/08/2021, 1:44 PM

## 2021-12-08 NOTE — H&P (Signed)
12/08/21 11:27 AM   Heather Lopez 14-May-1946 [id number]  CC: History of sepsis from urinary source, bilateral ureteral stones, bilateral renal stones  HPI: Very comorbid 76 year old female with extensive hospitalization over the last few months including sepsis from urinary source, left UPJ stone treated by Dr. Bernardo Heater in August 2022 with left ureteral stent placement, as well as right ureteral stent placement for purulent drainage from the right kidney.  Recent urine culture 12/02/2021 with Proteus, and was treated with culture appropriate antibiotics.  Here today for definitive management with bilateral ureteroscopy and stone removal  PMH: Past Medical History:  Diagnosis Date   Anxiety    Cataract    Depression    Diabetes mellitus without complication ([place])    Gout    History of kidney stones    Hyperlipidemia    Hypertension    Vertigo     Surgical History: Past Surgical History:  Procedure Laterality Date   CATARACT EXTRACTION, BILATERAL Bilateral 2019   COLONOSCOPY WITH PROPOFOL N/A 08/19/2015   Procedure: COLONOSCOPY WITH PROPOFOL;  Surgeon: Lucilla Lame, MD;  Location: [facility];  Service: Endoscopy;  Laterality: N/A;  diabetic - insulin   CYSTOSCOPY W/ RETROGRADES Bilateral 12/22/2019   Procedure: CYSTOSCOPY WITH RETROGRADE PYELOGRAM;  Surgeon: Abbie Sons, MD;  Location: [facility];  Service: Urology;  Laterality: Bilateral;   CYSTOSCOPY W/ URETERAL STENT PLACEMENT Bilateral 07/05/2021   Procedure: CYSTOSCOPY WITH RETROGRADE PYELOGRAM/BILATERAL URETERAL STENT PLACEMENT;  Surgeon: Abbie Sons, MD;  Location: [facility];  Service: Urology;  Laterality: Bilateral;   CYSTOSCOPY WITH BIOPSY N/A 12/22/2019   Procedure: CYSTOSCOPY WITH bladder BIOPSY;  Surgeon: Abbie Sons, MD;  Location: [facility];  Service: Urology;  Laterality: N/A;   CYSTOSCOPY WITH STENT PLACEMENT Right 12/22/2019   Procedure: CYSTOSCOPY WITH STENT PLACEMENT;  Surgeon: Abbie Sons,  MD;  Location: [facility];  Service: Urology;  Laterality: Right;   CYSTOSCOPY WITH URETEROSCOPY Bilateral 12/22/2019   Procedure: CYSTOSCOPY WITH URETEROSCOPY;  Surgeon: Abbie Sons, MD;  Location: [facility];  Service: Urology;  Laterality: Bilateral;   CYSTOSCOPY/URETEROSCOPY/HOLMIUM LASER/STENT PLACEMENT Right 12/22/2019   Procedure: CYSTOSCOPY/URETEROSCOPY/HOLMIUM LASER/STENT PLACEMENT;  Surgeon: Abbie Sons, MD;  Location: [facility];  Service: Urology;  Laterality: Right;   DIALYSIS/PERMA CATHETER INSERTION N/A 06/29/2021   Procedure: DIALYSIS/PERMA CATHETER INSERTION;  Surgeon: Algernon Huxley, MD;  Location: [facility];  Service: Cardiovascular;  Laterality: N/A;   DIALYSIS/PERMA CATHETER INSERTION N/A 07/13/2021   Procedure: DIALYSIS/PERMA CATHETER INSERTION;  Surgeon: Algernon Huxley, MD;  Location: [facility];  Service: Cardiovascular;  Laterality: N/A;   EYE SURGERY     IR GASTROSTOMY TUBE MOD SED  08/01/2021   LAPAROTOMY N/A 06/29/2021   Procedure: EXPLORATORY LAPAROTOMY WITH REPAIR OF DUODENAL PERFORATION;  Surgeon: [name], MD;  Location: [facility];  Service: General;  Laterality: N/A;   POLYPECTOMY  08/19/2015   Procedure: POLYPECTOMY;  Surgeon: Lucilla Lame, MD;  Location: [place];  Service: Endoscopy;;   TUBAL LIGATION  1992    Family History: Family History  Problem Relation Age of Onset   Diabetes Father    Cancer Father        lung cancer   Diabetes Brother    Stroke Mother     Social History:  reports that she quit smoking about 50 years ago. Her smoking use included cigarettes. She has a 4.50 pack-year smoking history. She has never used smokeless tobacco. She reports that she  does not drink alcohol and does not use drugs.  Physical Exam: BP (!) 159/59 (BP Location: Right Arm)    Pulse 63    Temp (!) 96.7 F (35.9 C) (Tympanic)    Resp 20    Ht 5\' 3"  (1.6 m)    Wt 87.2 kg    SpO2 98%    BMI 34.06 kg/m    Constitutional: Chronically  ill-appearing Cardiovascular: Regular rate and rhythm Respiratory: Clear bilaterally, diminished at bases GI: Abdomen is soft, nontender, nondistended, no abdominal masses   Laboratory Data: Reviewed, culture positive for 30k Proteus 12/02/2021  Pertinent Imaging: I have personally viewed and interpreted the CT showing bilateral ureteral stents, multiple renal stones.  Assessment & Plan:   Comorbid 76 year old female with multiple medical issues and hospitalizations who underwent bilateral ureteral stent placement by Dr. Bernardo Heater in August 2022.  Urology was requested to perform bilateral ureteroscopy for stone removal in the setting of her recurrent infections and current hospitalization, recent urine culture treated with culture appropriate antibiotics  We specifically discussed the risks ureteroscopy including bleeding, infection/sepsis, stent related symptoms including flank pain/urgency/frequency/incontinence/dysuria, ureteral injury, inability to access stone, or need for staged or additional procedures.  Cystoscopy, bilateral ureteroscopy, laser lithotripsy, stent placement today  Nickolas Madrid, MD 12/08/2021  [place] [address] [place], [place] [address] [phone]

## 2021-12-08 NOTE — Op Note (Signed)
Date of procedure: 12/08/21  Preoperative diagnosis:  Left renal stones Right renal stones  Postoperative diagnosis:  Same  Procedure: Cystoscopy, bilateral retrograde pyelograms with intraoperative interpretation, bilateral ureteral stent placement Left ureteroscopy, laser lithotripsy of multiple left renal stones Right ureteroscopy, laser lithotripsy of right renal stone  Surgeon: Nickolas Madrid, MD  Anesthesia: General  Complications: None  Intraoperative findings:  Erythematous bladder, no suspicious lesions Uncomplicated dusting of 3 large left renal stones Uncomplicated dusting of 1 large right lower pole stone Bilateral ureteral stent placement and Foley replacement  EBL: Minimal  Specimens: None  Drains: Bilateral 6 French by 24 cm ureteral stent, 16 French Foley  Indication: Heather Lopez is a 76 y.o. patient with very complex history including sepsis from urinary source with bilateral ureteral stent placement by Dr. Bernardo Heater in August 2022.  Here today for definitive management with bilateral ureteroscopy.  After reviewing the management options for treatment, they elected to proceed with the above surgical procedure(s). We have discussed the potential benefits and risks of the procedure, side effects of the proposed treatment, the likelihood of the patient achieving the goals of the procedure, and any potential problems that might occur during the procedure or recuperation. Informed consent has been obtained.  Description of procedure:  The patient was taken to the operating room and general anesthesia was induced. SCDs were placed for DVT prophylaxis. The patient was placed in the dorsal lithotomy position, prepped and draped in the usual sterile fashion, and preoperative antibiotics(ampicillin) were administered. A preoperative time-out was performed.   A 21 French rigid cystoscope was used to intubate the urethra and thorough cystoscopy was performed.  The bladder was  erythematous consistent with indwelling ureteral stents and indwelling Foley, but no suspicious lesions.  A sensor wire was advanced along the left ureteral stent and passed up to the kidney under fluoroscopic vision.  The left ureteral stent was grasped and pulled to the meatus, and a second safety sensor wire was added.  A 12/14 French ureteral access sheath was advanced gently over the wire under fluoroscopic vision up into the kidney.  The single channel digital flexible ureteroscope was advanced through the sheath and thorough pyeloscopy revealed 2 large stones in the upper pole measuring about 1 cm each, as well as a lower pole 1 cm stone.  The 200 m laser fiber on settings of 0.5 J and 80 Hz was used to methodically dust all of the stones.  Thorough pyeloscopy revealed no stone fragments > 1 mm.  A retrograde pyelogram was performed from the proximal ureter and showed no extravasation or filling defects.  The sheath was removed and careful pullback ureteroscopy showed no ureteral stones or injury.  The rigid cystoscope was backloaded over the wire, and a 6 [place] by 24 cm ureteral stent was uneventfully placed over the wire with an excellent curl in the renal pelvis, as well as under direct vision the bladder.  The rigid cystoscope was then used to pass another sensor wire up to the right kidney alongside the stent.  The right ureteral stent was grasped and pulled to the meatus, and a second safety sensor wire was added up into the kidney.  The 12/14 French ureteral access sheath was advanced gently over the wire under fluoroscopic vision up to the kidney.  The single channel digital flexible ureteroscope was advanced through the sheath and thorough pyeloscopy was performed.  There was a large 1 cm stone in the lower pole, and with the 200 m  laser fiber on previously mentioned settings the stone was dusted to <1 mm fragments.  There are pyeloscopy revealed no residual stones.  Retrograde pyelogram from  the proximal ureter showed no extravasation or filling defects.  The sheath was removed and careful pullback ureteroscopy showed no fragments or injury.  A 6 French by 24 cm ureteral stent was uneventfully placed over the wire with an excellent curl in the renal pelvis as well as under direct vision the bladder.  A 16 French Foley was replaced into the bladder, 10 mL placed in the balloon, and the catheter connected to drainage.  Disposition: Stable to PACU  Plan: Foley can be removed anytime after tomorrow from urology perspective Follow-up with Dr. Bernardo Heater in clinic in 2 weeks for bilateral stent removal  Nickolas Madrid, MD

## 2021-12-08 NOTE — Transfer of Care (Signed)
Immediate Anesthesia Transfer of Care Note  Patient: Heather Lopez  Procedure(s) Performed: CYSTOSCOPY/URETEROSCOPY/HOLMIUM LASER/STENT PLACEMENT (Bilateral)  Patient Location: PACU  Anesthesia Type:General  Level of Consciousness: awake, alert  and oriented  Airway & Oxygen Therapy: Patient Spontanous Breathing and Patient connected to face mask oxygen  Post-op Assessment: Report given to RN and Post -op Vital signs reviewed and stable  Post vital signs: Reviewed and stable  Last Vitals:  Vitals Value Taken Time  BP 151/49 12/08/21 1331  Temp    Pulse 52 12/08/21 1333  Resp 9 12/08/21 1333  SpO2 100 % 12/08/21 1333  Vitals shown include unvalidated device data.  Last Pain:  Vitals:   12/08/21 1057  TempSrc: Tympanic  PainSc:          Complications: No notable events documented.

## 2021-12-08 NOTE — Progress Notes (Signed)
Patient shaking from surgery. Has warmer on medium setting, patient temp is 98.5. Notified Dr. Rosey Bath, demerol 6.25 mg IV ordered.

## 2021-12-08 NOTE — TOC Progression Note (Signed)
Transition of Care [facility]) - Progression Note    Patient Details  Name: Heather Lopez MRN: [id number] Date of Birth: 08/26/1946  Transition of Care [facility]) CM/SW [place], RN Phone Number: 12/08/2021, 4:58 PM  Clinical Narrative:   Reviewed bed 96ffers with the patient and her husband, they chose [place] rehab, I notified Ebony Hail at [facility]    Expected Discharge Plan: [place] Barriers to Discharge: Continued Medical Work up  Expected Discharge Plan and Services Expected Discharge Plan: [place] Acute Care Choice: Resumption of Svcs/PTA Provider Living arrangements for the past 2 months: [place]                                       Social Determinants of Health (SDOH) Interventions    Readmission Risk Interventions Readmission Risk Prevention Plan 12/03/2021  Transportation Screening Complete  Medication Review Press photographer) Complete  PCP or Specialist appointment within 3-5 days of discharge Complete  SW Recovery Care/Counseling Consult Complete  [place] Complete  Some recent data might be hidden

## 2021-12-08 NOTE — Progress Notes (Signed)
[place] [facility] [place] NAME: Jenney Brester    MR#:  [id number]  DATE OF BIRTH:  1946-08-22  SUBJECTIVE:  patient's husband at bedside. No new issues. Patient is  NPO for urological procedure today                              REVIEW OF SYSTEMS:   Review of Systems  Constitutional:  Positive for malaise/fatigue. Negative for chills, fever and weight loss.  HENT:  Negative for ear discharge, ear pain and nosebleeds.   Eyes:  Negative for blurred vision, pain and discharge.  Respiratory:  Negative for sputum production, shortness of breath, wheezing and stridor.   Cardiovascular:  Negative for chest pain, palpitations, orthopnea and PND.  Gastrointestinal:  Negative for abdominal pain, diarrhea, nausea and vomiting.  Genitourinary:  Negative for frequency and urgency.  Musculoskeletal:  Negative for back pain and joint pain.  Neurological:  Positive for weakness. Negative for sensory change, speech change and focal weakness.  Psychiatric/Behavioral:  Negative for depression and hallucinations. The patient is not nervous/anxious.   Tolerating Diet:yes Tolerating PT: rehab  DRUG ALLERGIES:   Allergies  Allergen Reactions   Contrast Media  [Iodinated Contrast Media] Anaphylaxis   Iodine Swelling    (IV only) - angioedema    VITALS:  Blood pressure (!) 159/59, pulse 63, temperature (!) 96.7 F (35.9 C), temperature source Tympanic, resp. rate 20, height 5\' 3"  (1.6 m), weight 87.2 kg, SpO2 98 %.  PHYSICAL EXAMINATION:   Physical Exam  GENERAL:  76 y.o.-year-old patient lying in the bed with no acute distress. sheis deconditioned HEENT: Head atraumatic, normocephalic. Oropharynx and nasopharynx clear.  LUNGS: Normal breath sounds bilaterally, no wheezing, rales, rhonchi. No use of accessory muscles of respiration.  CARDIOVASCULAR: S1, S2 normal. No murmurs, rubs, or gallops.  ABDOMEN: Soft, nontender, nondistended. Bowel sounds  present. EXTREMITIES: edema+ mild  NEUROLOGIC: nonfocal PSYCHIATRIC:  patient alert and awake SKIN:   Pressure Injury 07/07/21 Buttocks Stage 2 -  Partial thickness loss of dermis presenting as a shallow open injury with a red, pink wound bed without slough. (Active)  07/07/21 0800  Location: Buttocks  Location Orientation:   Staging: Stage 2 -  Partial thickness loss of dermis presenting as a shallow open injury with a red, pink wound bed without slough.  Wound Description (Comments):   Present on Admission: No     Pressure Injury 07/24/21 Coccyx Mid Unstageable - Full thickness tissue loss in which the base of the injury is covered by slough (yellow, tan, gray, green or brown) and/or eschar (tan, brown or black) in the wound bed. Unstageable Sacral Wound (Active)  07/24/21 0000  Location: Coccyx  Location Orientation: Mid  Staging: Unstageable - Full thickness tissue loss in which the base of the injury is covered by slough (yellow, tan, gray, green or brown) and/or eschar (tan, brown or black) in the wound bed.  Wound Description (Comments): Unstageable Sacral Wound  Present on Admission:      Pressure Injury 07/30/21 Heel Right Stage 1 -  Intact skin with non-blanchable redness of a localized area usually over a bony prominence. (Active)  07/30/21 0730  Location: Heel  Location Orientation: Right  Staging: Stage 1 -  Intact skin with non-blanchable redness of a localized area usually over a bony prominence.  Wound Description (Comments):   Present on Admission:      Pressure  Injury 12/03/21 Sacrum Mid Stage 4 - Full thickness tissue loss with exposed bone, tendon or muscle. (Active)  12/03/21 0200  Location: Sacrum  Location Orientation: Mid  Staging: Stage 4 - Full thickness tissue loss with exposed bone, tendon or muscle.  Wound Description (Comments):   Present on Admission: Yes        LABORATORY PANEL:  CBC Recent Labs  Lab 12/04/21 0500  WBC 11.6*  HGB 9.7*   HCT 30.9*  PLT 215     Chemistries  Recent Labs  Lab 12/03/21 0451 12/04/21 0500 12/05/21 0627 12/06/21 0557  NA 137 138  --   --   K 5.1 3.8  --   --   CL 111 109  --   --   CO2 15* 22  --   --   GLUCOSE 170* 107*  --   --   BUN 51* 43*  --   --   CREATININE 2.00* 1.64*   < > 1.52*  CALCIUM 9.7 9.2  --   --   MG 1.5*  --   --   --   AST 11*  --   --   --   ALT 7  --   --   --   ALKPHOS 134*  --   --   --   BILITOT 0.7  --   --   --    < > = values in this interval not displayed.    Cardiac Enzymes No results for input(s): TROPONINI in the last 168 hours. RADIOLOGY:  DG OR UROLOGY CYSTO IMAGE ([facility] ONLY)  Result Date: 12/08/2021 There is no interpretation for this exam.  This order is for images obtained during a surgical procedure.  Please See "Surgeries" Tab for more information regarding the procedure.   ASSESSMENT AND PLAN:   76 yo female with the past medical history of HTN, dyslipidemia, T2DM, sp bilateral ureteral stents, left UPJ calculus, right hydronephrosis, pelvic abscess, candida urine infection, obesity and duodenal ulcer who presented with pelvic pain. Reported worsening lower abdominal pain, for the last several days, more intense over 24 hours leading into her hospitalization  She has a chronic indwelling foley cathter summer 2022 and chronic right upper extremity PICC line fall 2022.   --Urology exchanged Foley.  Initial bilateral hydronephrosis seen on imaging has completely resolved on follow-up renal ultrasound.   --General surgery engaged for evaluation of sacral wound.  -- Wound VAC applied at bedside.  Per last general surgery note wound checks with dressing change Monday Wednesday Friday.  Severe sepsis due to complicated urinary tract infection (obstructive uropathy and nephrolithiasis), present on admission, end organ failure AKI/ acute metabolic encephalopathy.  Urine infection associated with chronic indwelling foley catheter. --Patient  remains somnolent --Leukocytosis improving --Urine culture positive for Proteus mirabilis --Blood cultures no growth to date --Foley catheter has been exchanged, appreciate urology consultation. Per urology patient will need bilateral stent exchange as outpatient --Follow-up renal ultrasound shows resolution of hydronephrosis -- patient currently on IV Rocephin. ID managing IV antibiotics. Issues with right upper extremity PICC line. Awaiting to see final recommendation regarding antibiotic. If IV antibiotic is recommended patient will need another PICC line --1/11-- will continue IV ampicillin through peripheral line. Discussed with urology patient will get her stone treatment and stent exchanged on Friday by Dr Diamantina Providence. --1/12-- no new issues. 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 Hyponatremia.  --Foley catheter has been exchanged. -Improvement in renal function and metabolic parameters --DC bicarbonate infusion --Avoid hypotension and nephrotoxins --good UOP. D/c IVF for now   Stage IV sacrum pressure ulcer, chronic coccygeal osteomyelitis.  --Continue local wound care per surgery consultation. --Wound VAC in place--wound looks better --General surgery follow-up, wound checks MWF   T2DM/ dyslipidemia --Continue glucose cover and monitoring with insulin sliding scale.  --Basal insulin with levemir 10 units bid --Continue with satin therapy with atorvastatin.    HTN  --Restarting home regimen withMetoprolol,Clonidine --As needed IV hydralazine   Obesity class 2 -- calculated BMI is 35,9    Mild protein calorie malnutrition/ non ambulatory state.  Cont RD recs. Per husband pt eating better     DVT prophylaxis: SQ Lovenox Code Status: DNR Family Communication: husband at bedside Disposition Plan: Status is: Inpatient   Remains  inpatient appropriate because: Severe sepsis, AKI secondary to obstructive uropathy and UTI.  Renal parameters improving.   Pt to get urological procedure today by Dr Diamantina Providence. D/c later next week once rehab available --TOC looking for rehab beds    TOTAL TIME TAKING CARE OF THIS PATIENT: 25 minutes.  >50% time spent on counselling and coordination of care  Note: This dictation was prepared with Dragon dictation along with smaller phrase technology. Any transcriptional errors that result from this process are unintentional.  Fritzi Mandes M.D    [facility]   CC: Primary care physician; [name], PA-C Patient ID: [name], female   DOB: 1946-01-28, 76 y.o.   MRN: [id number]

## 2021-12-08 NOTE — Anesthesia Preprocedure Evaluation (Signed)
Anesthesia Evaluation  Patient identified by MRN, date of birth, ID band Patient unresponsive  General Assessment Comment:  S/p ex-lap 06/30/21 for perforated duodenum. Remains intubated, on 30% FiO2, with saturations ~94%. In acute renal failure, needing a ureteral stent  Reviewed: Allergy & Precautions, NPO status , Patient's Chart, lab work & pertinent test results, Unable to perform ROS - Chart review only  History of Anesthesia Complications Negative for: history of anesthetic complications  Airway Mallampati: III  TM Distance: >3 FB     Dental  (+) Teeth Intact, Dental Advidsory Given   Pulmonary neg pulmonary ROS, neg shortness of breath, neg sleep apnea, neg pneumonia , neg COPD, neg recent URI, Patient abstained from smoking.Not current smoker, former smoker,     + decreased breath sounds      Cardiovascular Exercise Tolerance: Good METShypertension, (-) angina(-) CAD and (-) Past MI (-) dysrhythmias  Rhythm:Regular Rate:Tachycardia - Systolic murmurs TTE: 1. Left ventricular ejection fraction, by estimation, is 55 to 60%. The  left ventricle has normal function. Left ventricular endocardial border  not optimally defined to evaluate regional wall motion. There is mild left  ventricular hypertrophy. Left  ventricular diastolic parameters were normal.  2. Right ventricular systolic function was not well visualized. The right  ventricular size is normal. Tricuspid regurgitation signal is inadequate  for assessing PA pressure.  3. Left atrial size was moderately dilated.  4. The mitral valve is abnormal. No evidence of mitral valve  regurgitation. No evidence of mitral stenosis.  5. The aortic valve is tricuspid. There is mild calcification of the  aortic valve. There is mild thickening of the aortic valve. Aortic valve  regurgitation is not visualized. Mild to moderate aortic valve  sclerosis/calcification is present,  without any  evidence of aortic stenosis.    Neuro/Psych PSYCHIATRIC DISORDERS Anxiety Depression negative neurological ROS     GI/Hepatic neg GERD  ,(+)     (-) substance abuse  ,   Endo/Other  diabetes  Renal/GU ARFRenal disease     Musculoskeletal   Abdominal   Peds  Hematology   Anesthesia Other Findings Past Medical History: No date: Anxiety No date: Cataract No date: Depression No date: Diabetes mellitus without complication (HCC) No date: Gout No date: History of kidney stones No date: Hyperlipidemia No date: Hypertension No date: Vertigo  Reproductive/Obstetrics                             Anesthesia Physical  Anesthesia Plan  ASA: 3  Anesthesia Plan: General   Post-op Pain Management:    Induction: Intravenous  PONV Risk Score and Plan: 4 or greater and Ondansetron, Dexamethasone and Treatment may vary due to age or medical condition  Airway Management Planned: Oral ETT  Additional Equipment:   Intra-op Plan:   Post-operative Plan: Post-operative intubation/ventilation  Informed Consent: I have reviewed the patients History and Physical, chart, labs and discussed the procedure including the risks, benefits and alternatives for the proposed anesthesia with the patient or authorized representative who has indicated his/her understanding and acceptance.     Dental advisory given and Consent reviewed with POA  Plan Discussed with: CRNA and Surgeon  Anesthesia Plan Comments:         Anesthesia Quick Evaluation

## 2021-12-08 NOTE — Anesthesia Procedure Notes (Signed)
Procedure Name: Intubation Date/Time: 12/08/2021 11:56 AM Performed by: Aline Brochure, CRNA Pre-anesthesia Checklist: Patient identified, Patient being monitored, Timeout performed, Emergency Drugs available and Suction available Patient Re-evaluated:Patient Re-evaluated prior to induction Oxygen Delivery Method: Circle system utilized Preoxygenation: Pre-oxygenation with 100% oxygen Induction Type: IV induction Ventilation: Mask ventilation without difficulty Laryngoscope Size: 3 and McGraph Grade View: Grade I Tube type: Oral Tube size: 7.0 mm Number of attempts: 1 Airway Equipment and Method: Stylet and Video-laryngoscopy Placement Confirmation: ETT inserted through vocal cords under direct vision, positive ETCO2 and breath sounds checked- equal and bilateral Secured at: 21 cm Tube secured with: Tape Dental Injury: Teeth and Oropharynx as per pre-operative assessment

## 2021-12-08 NOTE — Plan of Care (Signed)

## 2021-12-09 LAB — GLUCOSE, CAPILLARY
Glucose-Capillary: 327 mg/dL — ABNORMAL HIGH (ref 70–99)
Glucose-Capillary: 423 mg/dL — ABNORMAL HIGH (ref 70–99)
Glucose-Capillary: 384 mg/dL — ABNORMAL HIGH (ref 70–99)
Glucose-Capillary: 366 mg/dL — ABNORMAL HIGH (ref 70–99)

## 2021-12-09 MED ORDER — AMOXICILLIN 500 MG PO CAPS
500.0000 mg | ORAL_CAPSULE | Freq: Three times a day (TID) | ORAL | Status: DC
Start: 2021-12-09 — End: 2021-12-12
  Administered 2021-12-09 – 2021-12-12 (×8): 500 mg via ORAL
  Filled 2021-12-09 (×11): qty 1

## 2021-12-09 MED ORDER — HEPARIN SOD (PORK) LOCK FLUSH 100 UNIT/ML IV SOLN
500.0000 [IU] | INTRAVENOUS | Status: DC | PRN
  Filled 2021-12-09: qty 5

## 2021-12-09 MED ORDER — INSULIN GLARGINE-YFGN 100 UNIT/ML ~~LOC~~ SOLN
15.0000 [IU] | Freq: Two times a day (BID) | SUBCUTANEOUS | Status: DC
  Administered 2021-12-09 – 2021-12-10 (×3): 15 [IU] via SUBCUTANEOUS
  Filled 2021-12-09 (×4): qty 0.15

## 2021-12-09 MED ORDER — INSULIN ASPART 100 UNIT/ML IJ SOLN
25.0000 [IU] | Freq: Once | INTRAMUSCULAR | Status: AC
  Administered 2021-12-09: 25 [IU] via SUBCUTANEOUS

## 2021-12-09 NOTE — Anesthesia Postprocedure Evaluation (Signed)
Anesthesia Post Note  Patient: Heather Lopez  Procedure(s) Performed: CYSTOSCOPY/URETEROSCOPY/HOLMIUM LASER/STENT PLACEMENT (Bilateral)  Patient location during evaluation: PACU Anesthesia Type: General Level of consciousness: awake and alert Pain management: pain level controlled Vital Signs Assessment: post-procedure vital signs reviewed and stable Respiratory status: spontaneous breathing, nonlabored ventilation, respiratory function stable and patient connected to nasal cannula oxygen Cardiovascular status: blood pressure returned to baseline and stable Postop Assessment: no apparent nausea or vomiting Anesthetic complications: no   No notable events documented.   Last Vitals:  Vitals:   12/09/21 1213 12/09/21 1612  BP: (!) 139/55 (!) 137/51  Pulse: 71 66  Resp: 18 16  Temp: 36.7 C 36.8 C  SpO2: 95% 94%    Last Pain:  Vitals:   12/09/21 1612  TempSrc: Oral  PainSc:                  Martha Clan

## 2021-12-09 NOTE — Progress Notes (Signed)
[place] at [place] NAME: Heather Lopez    MR#:  [id number]  DATE OF BIRTH:  [birth year]  SUBJECTIVE:  patient's husband at bedside. No new issues. Denies bladder spasms today D/w pt about bladder training over 24 hours and removing foley tomorrow am                REVIEW OF SYSTEMS:   Review of Systems  Constitutional:  Positive for malaise/fatigue. Negative for chills, fever and weight loss.  HENT:  Negative for ear discharge, ear pain and nosebleeds.   Eyes:  Negative for blurred vision, pain and discharge.  Respiratory:  Negative for sputum production, shortness of breath, wheezing and stridor.   Cardiovascular:  Negative for chest pain, palpitations, orthopnea and PND.  Gastrointestinal:  Negative for abdominal pain, diarrhea, nausea and vomiting.  Genitourinary:  Negative for frequency and urgency.  Musculoskeletal:  Negative for back pain and joint pain.  Neurological:  Positive for weakness. Negative for sensory change, speech change and focal weakness.  Psychiatric/Behavioral:  Negative for depression and hallucinations. The patient is not nervous/anxious.   Tolerating Diet:yes Tolerating PT: rehab  DRUG ALLERGIES:   Allergies  Allergen Reactions   Contrast Media  [Iodinated Contrast Media] Anaphylaxis   Iodine Swelling    (IV only) - angioedema    VITALS:  Blood pressure (!) 148/53, pulse 61, temperature 98.6 F (37 C), resp. rate 18, height 5\' 3"  (1.6 m), weight 87.2 kg, SpO2 96 %.  PHYSICAL EXAMINATION:   Physical Exam  GENERAL:  76 y.o.-year-old patient lying in the bed with no acute distress. sheis deconditioned HEENT: Head atraumatic, normocephalic. Oropharynx and nasopharynx clear.  LUNGS: Normal breath sounds bilaterally, no wheezing, rales, rhonchi. No use of accessory muscles of respiration.  CARDIOVASCULAR: S1, S2 normal. No murmurs, rubs, or gallops.  ABDOMEN: Soft, nontender, nondistended. Bowel sounds  present. EXTREMITIES: edema+ mild  NEUROLOGIC: nonfocal PSYCHIATRIC:  patient alert and awake SKIN:   Pressure Injury 07/07/21 Buttocks Stage 2 -  Partial thickness loss of dermis presenting as a shallow open injury with a red, pink wound bed without slough. (Active)  07/07/21 0800  Location: Buttocks  Location Orientation:   Staging: Stage 2 -  Partial thickness loss of dermis presenting as a shallow open injury with a red, pink wound bed without slough.  Wound Description (Comments):   Present on Admission: No     Pressure Injury 07/24/21 Coccyx Mid Unstageable - Full thickness tissue loss in which the base of the injury is covered by slough (yellow, tan, gray, green or brown) and/or eschar (tan, brown or black) in the wound bed. Unstageable Sacral Wound (Active)  07/24/21 0000  Location: Coccyx  Location Orientation: Mid  Staging: Unstageable - Full thickness tissue loss in which the base of the injury is covered by slough (yellow, tan, gray, green or brown) and/or eschar (tan, brown or black) in the wound bed.  Wound Description (Comments): Unstageable Sacral Wound  Present on Admission:      Pressure Injury 07/30/21 Heel Right Stage 1 -  Intact skin with non-blanchable redness of a localized area usually over a bony prominence. (Active)  07/30/21 0730  Location: Heel  Location Orientation: Right  Staging: Stage 1 -  Intact skin with non-blanchable redness of a localized area usually over a bony prominence.  Wound Description (Comments):   Present on Admission:      Pressure Injury 12/03/21 Sacrum Mid Stage 4 - Full thickness  tissue loss with exposed bone, tendon or muscle. (Active)  12/03/21 0200  Location: Sacrum  Location Orientation: Mid  Staging: Stage 4 - Full thickness tissue loss with exposed bone, tendon or muscle.  Wound Description (Comments):   Present on Admission: Yes   LABORATORY PANEL:  CBC Recent Labs  Lab 12/04/21 0500  WBC 11.6*  HGB 9.7*  HCT 30.9*   PLT 215     Chemistries  Recent Labs  Lab 12/03/21 0451 12/04/21 0500 12/05/21 0627 12/06/21 0557  NA 137 138  --   --   K 5.1 3.8  --   --   CL 111 109  --   --   CO2 15* 22  --   --   GLUCOSE 170* 107*  --   --   BUN 51* 43*  --   --   CREATININE 2.00* 1.64*   < > 1.52*  CALCIUM 9.7 9.2  --   --   MG 1.5*  --   --   --   AST 11*  --   --   --   ALT 7  --   --   --   ALKPHOS 134*  --   --   --   BILITOT 0.7  --   --   --    < > = values in this interval not displayed.    Cardiac Enzymes No results for input(s): TROPONINI in the last 168 hours. RADIOLOGY:  DG OR UROLOGY CYSTO IMAGE ([facility] ONLY)  Result Date: 12/08/2021 There is no interpretation for this exam.  This order is for images obtained during a surgical procedure.  Please See "Surgeries" Tab for more information regarding the procedure.   ASSESSMENT AND PLAN:   76 yo female with the past medical history of HTN, dyslipidemia, T2DM, sp bilateral ureteral stents, left UPJ calculus, right hydronephrosis, pelvic abscess, candida urine infection, obesity and duodenal ulcer who presented with pelvic pain. Reported worsening lower abdominal pain, for the last several days, more intense over 24 hours leading into her hospitalization  She has a chronic indwelling foley cathter summer 2022 and chronic right upper extremity PICC line fall 2022.   --Urology exchanged Foley.  Initial bilateral hydronephrosis seen on imaging has completely resolved on follow-up renal ultrasound.   --General surgery engaged for evaluation of sacral wound.  -- Wound VAC applied at bedside.  Per last general surgery note wound checks with dressing change Monday Wednesday Friday.  Severe sepsis due to complicated urinary tract infection (obstructive uropathy and nephrolithiasis), present on admission, end organ failure AKI/ acute metabolic encephalopathy.  Urine infection associated with chronic indwelling foley catheter. --Patient remains  somnolent --Leukocytosis improving --Urine culture positive for Proteus mirabilis --Blood cultures no growth to date --Foley catheter has been exchanged, appreciate urology consultation. Per urology patient will need bilateral stent exchange as outpatient --Follow-up renal ultrasound shows resolution of hydronephrosis -- patient currently on IV Rocephin. ID managing IV antibiotics. Issues with right upper extremity PICC line. Awaiting to see final recommendation regarding antibiotic. If IV antibiotic is recommended patient will need another PICC line --1/11-- will continue IV ampicillin through peripheral line. Discussed with urology patient will get her stone treatment and stent exchanged on Friday by Dr Diamantina Providence. --1/12-- no new issues. NPO after midnight tonight --1/13--for Urological procedure today --1/14-- s/p Cystoscopy bilateral retrograde pyelograms with intraoperative interpretation, bilateral ureteral stent placement Left ureteroscopy, laser lithotripsy of multiple left renal stones Right ureteroscopy, laser lithotripsy of right renal  stone Intraoperative findings:  Erythematous bladder, no suspicious lesions Uncomplicated dusting of 3 large left renal stones Uncomplicated dusting of 1 large right lower pole stone Bilateral ureteral stent placement and Foley replacement --per Urology ok to remove foley in am 1/15--d/w pt and husband   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 Hyponatremia.  --Foley catheter has been exchanged. -Improvement in renal function and metabolic parameters --DC bicarbonate infusion --Avoid hypotension and nephrotoxins --good UOP. D/c IVF for now -creat 1.52   Stage IV sacrum pressure ulcer, chronic coccygeal osteomyelitis.  --Continue local wound care per surgery consultation. --Wound VAC in place--wound looks better --General surgery follow-up, dressing changes MWF   T2DM/  dyslipidemia -- insulin sliding scale.  --Basal insulin with levemir 15 units bid --Continue atorvastatin.    HTN  --Restarting home regimen with Metoprolol,Clonidine --As needed IV hydralazine   Obesity class 2 -- calculated BMI is 35,9    Mild protein calorie malnutrition/ non ambulatory state.  Cont RD recs. Per husband pt eating better     DVT prophylaxis: SQ Lovenox Code Status: DNR Family Communication: husband at bedside Disposition Plan: Status is: Inpatient   Remains inpatient appropriate because: Severe sepsis, AKI secondary to obstructive uropathy and UTI.  Renal parameters improving.   Pt to get urological procedure today by Dr Diamantina Providence. D/c later next week once rehab available --TOC looking for rehab beds  1/14--TOC to re-discuss d/c plans. Rehab vs Home.    TOTAL TIME TAKING CARE OF THIS PATIENT: 25 minutes.  >50% time spent on counselling and coordination of care  Note: This dictation was prepared with Dragon dictation along with smaller phrase technology. Any transcriptional errors that result from this process are unintentional.  Fritzi Mandes M.D    [facility]   CC: Primary care physician; [name], PA-C Patient ID: [name], female   DOB: Jun 04, 1946, 76 y.o.   MRN: [id number]

## 2021-12-09 NOTE — TOC Progression Note (Signed)
Transition of Care [facility]) - Progression Note    Patient Details  Name: SHANDRIA CLINCH MRN: [id number] Date of Birth: Aug 01, 1946  Transition of Care [facility]) CM/SW Contact  Izola Price, RN Phone Number: 12/09/2021, 12:38 PM  Clinical Narrative:  Spouse asked to speak to RN CM re disposition. Does not want patient to go to [facility] after doing some research/time/distance. Spouse/with patient feels they can do as well at home or better for patient's emotional state and the other appointments they must make it to. Spouse is very engaged and organized in his thinking about how this will play out. He has been caring for her needs prior to this admission and not satisfied with prior facility.   Notified provider (who also asked RN CM to speak to spouse) and updated with spouses requests. Patient was agreeable as well. Discussed home health options/choices with no preference, and verbal permission obtained to speak to agencies and DME vendors. Updated provider and orders place. [facility] for PT/OT/RN/[place]. [facility] will arrange hospital bed Monday am per [facility]. Simmie Davies RN CM     Expected Discharge Plan: Skilled Nursing Facility Barriers to Discharge: Continued Medical Work up  Expected Discharge Plan and Services Expected Discharge Plan: [place] Acute Care Choice: Resumption of Svcs/PTA Provider Living arrangements for the past 2 months: [place]                                       Social Determinants of Health (SDOH) Interventions    Readmission Risk Interventions Readmission Risk Prevention Plan 12/03/2021  Transportation Screening Complete  Medication Review Press photographer) Complete  PCP or Specialist appointment within 3-5 days of discharge Complete  SW Recovery Care/Counseling Consult Complete  [place] Complete  Some recent data might be hidden

## 2021-12-10 ENCOUNTER — Encounter: Payer: Self-pay | Admitting: Urology

## 2021-12-10 LAB — GLUCOSE, CAPILLARY
Glucose-Capillary: 253 mg/dL — ABNORMAL HIGH (ref 70–99)
Glucose-Capillary: 256 mg/dL — ABNORMAL HIGH (ref 70–99)
Glucose-Capillary: 239 mg/dL — ABNORMAL HIGH (ref 70–99)
Glucose-Capillary: 336 mg/dL — ABNORMAL HIGH (ref 70–99)

## 2021-12-10 MED ORDER — INSULIN GLARGINE-YFGN 100 UNIT/ML ~~LOC~~ SOLN
20.0000 [IU] | Freq: Two times a day (BID) | SUBCUTANEOUS | Status: DC
  Administered 2021-12-10: 20 [IU] via SUBCUTANEOUS
  Filled 2021-12-10 (×3): qty 0.2

## 2021-12-10 NOTE — TOC Progression Note (Addendum)
Transition of Care [facility]) - Progression Note    Patient Details  Name: Heather Lopez MRN: [id number] Date of Birth: 03/09/1946  Transition of Care [facility]) CM/SW [place], [place] Phone Number: [phone] 12/10/2021, 4:48 PM  Clinical Narrative:     CSW spoke with Thedore Mins w/ [facility] and confirmed bed order for patient.  Planned deliver to home tomorrow 12/11/2021.  Plan for d/c is tomorrow 12/11/2021, patient's Blucher,Steven C (Spouse) [phone] [facility] Phone) main contact.    Expected Discharge Plan: [place] Barriers to Discharge: Continued Medical Work up  Expected Discharge Plan and Services Expected Discharge Plan: [place] Acute Care Choice: Resumption of Svcs/PTA Provider Living arrangements for the past 2 months: [place]                 DME Arranged: 3-N-1, Walker rolling, Shower stool, Hospital bed (Purchased RW/3:1/Shower seat.) DME Agency: [facility] (For hospital bed, should be set up Monday am.) Date DME Agency Contacted: 12/09/21 Time DME Agency Contacted: 1243 Representative spoke with at DME Agency: [place]: RN, PT, OT ([place] aide) [place]: [place] ([place]) Date [place]: 12/09/21 Time [place]: 1244 Representative spoke with at [place]: [place] (SDOH) Interventions    Readmission Risk Interventions Readmission Risk Prevention Plan 12/03/2021  Transportation Screening Complete  Medication Review Press photographer) Complete  PCP or Specialist appointment within 3-5 days of discharge Complete  SW Recovery Care/Counseling Consult Complete  [place] Complete  Some recent data might be hidden

## 2021-12-10 NOTE — Progress Notes (Signed)
Attempted to take out Foley catheter today but patient refused and requested to speak to Dr. Posey Pronto.  For now, patient wants to hold off on removing the Foley.  She would like to remove prior to being discharged. MD aware.

## 2021-12-10 NOTE — Progress Notes (Signed)
[place] at [place] NAME: Heather Lopez    MR#:  [id number]  DATE OF BIRTH:  24-Dec-1945  SUBJECTIVE:  No new issues.  Foley catheter to be removed today. RN attempted bladder training over 24 hours             REVIEW OF SYSTEMS:   Review of Systems  Constitutional:  Positive for malaise/fatigue. Negative for chills, fever and weight loss.  HENT:  Negative for ear discharge, ear pain and nosebleeds.   Eyes:  Negative for blurred vision, pain and discharge.  Respiratory:  Negative for sputum production, shortness of breath, wheezing and stridor.   Cardiovascular:  Negative for chest pain, palpitations, orthopnea and PND.  Gastrointestinal:  Negative for abdominal pain, diarrhea, nausea and vomiting.  Genitourinary:  Negative for frequency and urgency.  Musculoskeletal:  Negative for back pain and joint pain.  Neurological:  Positive for weakness. Negative for sensory change, speech change and focal weakness.  Psychiatric/Behavioral:  Negative for depression and hallucinations. The patient is not nervous/anxious.   Tolerating Diet:yes Tolerating PT: rehab  DRUG ALLERGIES:   Allergies  Allergen Reactions   Contrast Media  [Iodinated Contrast Media] Anaphylaxis   Iodine Swelling    (IV only) - angioedema    VITALS:  Blood pressure (!) 154/58, pulse (!) 53, temperature 97.8 F (36.6 C), resp. rate 18, height 5\' 3"  (1.6 m), weight 87.2 kg, SpO2 98 %.  PHYSICAL EXAMINATION:   Physical Exam  GENERAL:  76 y.o.-year-old patient lying in the bed with no acute distress. sheis deconditioned HEENT: Head atraumatic, normocephalic. Oropharynx and nasopharynx clear.  LUNGS: Normal breath sounds bilaterally, no wheezing, rales, rhonchi. No use of accessory muscles of respiration.  CARDIOVASCULAR: S1, S2 normal. No murmurs, rubs, or gallops.  ABDOMEN: Soft, nontender, nondistended. Bowel sounds present. EXTREMITIES: edema+ mild  NEUROLOGIC:  nonfocal PSYCHIATRIC:  patient alert and awake SKIN:   Pressure Injury 07/07/21 Buttocks Stage 2 -  Partial thickness loss of dermis presenting as a shallow open injury with a red, pink wound bed without slough. (Active)  07/07/21 0800  Location: Buttocks  Location Orientation:   Staging: Stage 2 -  Partial thickness loss of dermis presenting as a shallow open injury with a red, pink wound bed without slough.  Wound Description (Comments):   Present on Admission: No     Pressure Injury 07/24/21 Coccyx Mid Unstageable - Full thickness tissue loss in which the base of the injury is covered by slough (yellow, tan, gray, green or brown) and/or eschar (tan, brown or black) in the wound bed. Unstageable Sacral Wound (Active)  07/24/21 0000  Location: Coccyx  Location Orientation: Mid  Staging: Unstageable - Full thickness tissue loss in which the base of the injury is covered by slough (yellow, tan, gray, green or brown) and/or eschar (tan, brown or black) in the wound bed.  Wound Description (Comments): Unstageable Sacral Wound  Present on Admission:      Pressure Injury 07/30/21 Heel Right Stage 1 -  Intact skin with non-blanchable redness of a localized area usually over a bony prominence. (Active)  07/30/21 0730  Location: Heel  Location Orientation: Right  Staging: Stage 1 -  Intact skin with non-blanchable redness of a localized area usually over a bony prominence.  Wound Description (Comments):   Present on Admission:      Pressure Injury 12/03/21 Sacrum Mid Stage 4 - Full thickness tissue loss with exposed bone, tendon or muscle. (Active)  12/03/21 0200  Location: Sacrum  Location Orientation: Mid  Staging: Stage 4 - Full thickness tissue loss with exposed bone, tendon or muscle.  Wound Description (Comments):   Present on Admission: Yes   LABORATORY PANEL:  CBC Recent Labs  Lab 12/04/21 0500  WBC 11.6*  HGB 9.7*  HCT 30.9*  PLT 215     Chemistries  Recent Labs   Lab 12/04/21 0500 12/05/21 0627 12/06/21 0557  NA 138  --   --   K 3.8  --   --   CL 109  --   --   CO2 22  --   --   GLUCOSE 107*  --   --   BUN 43*  --   --   CREATININE 1.64*   < > 1.52*  CALCIUM 9.2  --   --    < > = values in this interval not displayed.    Cardiac Enzymes No results for input(s): TROPONINI in the last 168 hours. RADIOLOGY:  DG OR UROLOGY CYSTO IMAGE ([facility] ONLY)  Result Date: 12/08/2021 There is no interpretation for this exam.  This order is for images obtained during a surgical procedure.  Please See "Surgeries" Tab for more information regarding the procedure.   ASSESSMENT AND PLAN:   76 yo female with the past medical history of HTN, dyslipidemia, T2DM, sp bilateral ureteral stents, left UPJ calculus, right hydronephrosis, pelvic abscess, candida urine infection, obesity and duodenal ulcer who presented with pelvic pain. Reported worsening lower abdominal pain, for the last several days, more intense over 24 hours leading into her hospitalization  She has a chronic indwelling foley cathter summer 2022 and chronic right upper extremity PICC line fall 2022.   --Urology exchanged Foley.  Initial bilateral hydronephrosis seen on imaging has completely resolved on follow-up renal ultrasound.   --General surgery engaged for evaluation of sacral wound.  -- Wound VAC applied at bedside.  Per last general surgery note wound checks with dressing change Monday Wednesday Friday.  Severe sepsis due to complicated urinary tract infection (obstructive uropathy and nephrolithiasis), present on admission, end organ failure AKI/ acute metabolic encephalopathy.  Urine infection associated with chronic indwelling foley catheter. --Patient remains somnolent --Leukocytosis improving --Urine culture positive for Proteus mirabilis --Blood cultures no growth to date --Foley catheter has been exchanged, appreciate urology consultation. Per urology patient will need bilateral  stent exchange as outpatient --Follow-up renal ultrasound shows resolution of hydronephrosis -- patient currently on IV Rocephin. ID managing IV antibiotics. Issues with right upper extremity PICC line. Awaiting to see final recommendation regarding antibiotic. If IV antibiotic is recommended patient will need another PICC line --1/11-- will continue IV ampicillin through peripheral line. Discussed with urology patient will get her stone treatment and stent exchanged on Friday by Dr Diamantina Providence. --1/12-- no new issues. NPO after midnight tonight --1/13--for Urological procedure today --1/14-- s/p Cystoscopy bilateral retrograde pyelograms with intraoperative interpretation, bilateral ureteral stent placement Left ureteroscopy, laser lithotripsy of multiple left renal stones Right ureteroscopy, laser lithotripsy of right renal stone Intraoperative findings:  Erythematous bladder, no suspicious lesions Uncomplicated dusting of 3 large left renal stones Uncomplicated dusting of 1 large right lower pole stone Bilateral ureteral stent placement and Foley replacement --per Urology ok to remove foley in am 1/15--d/w pt and husband  1/15-- chronic Foley catheter to be removed today. Confirm with patient she would like to go home. Hospital bed to be delivered tomorrow.   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 Hyponatremia.  --Foley catheter has been exchanged. -Improvement in renal function and metabolic parameters --DC bicarbonate infusion --Avoid hypotension and nephrotoxins --good UOP. D/c IVF for now -creat 1.52   Stage IV sacrum pressure ulcer, chronic coccygeal osteomyelitis.  --Continue local wound care per surgery consultation. --Wound VAC in place--wound looks better --General surgery follow-up, dressing changes MWF   T2DM/ dyslipidemia -- insulin sliding scale.  --Basal insulin with levemir 15 units  bid --Continue atorvastatin.    HTN  --Restarting home regimen with Metoprolol,Clonidine --As needed IV hydralazine   Obesity class 2 -- calculated BMI is 35,9    Mild protein calorie malnutrition/ non ambulatory state.  Cont RD recs. Per husband pt eating better     DVT prophylaxis: SQ Lovenox Code Status: DNR Family Communication: husband at bedside Disposition Plan: Status is: Inpatient   Remains inpatient appropriate because: Severe sepsis, AKI secondary to obstructive uropathy and UTI.  Renal parameters improving.   Pt to get urological procedure today by Dr Diamantina Providence. D/c later next week once rehab available --TOC looking for rehab beds  1/14--TOC to re-discuss d/c plans. Rehab vs Home. 1/15-- patient will go home with home health tomorrow once hospital bed is delivered. Confirm with patient. Discussed with TOC to ensure bed delivery happens tomorrow.    TOTAL TIME TAKING CARE OF THIS PATIENT: 20 minutes.  >50% time spent on counselling and coordination of care  Note: This dictation was prepared with Dragon dictation along with smaller phrase technology. Any transcriptional errors that result from this process are unintentional.  Fritzi Mandes M.D    [facility]   CC: Primary care physician; [name], PA-C Patient ID: Heather Lopez, female   DOB: 1946/08/19, 76 y.o.   MRN: [id number]

## 2021-12-10 NOTE — Plan of Care (Signed)

## 2021-12-11 ENCOUNTER — Telehealth: Payer: Self-pay | Admitting: Internal Medicine

## 2021-12-11 ENCOUNTER — Encounter: Payer: Self-pay | Admitting: Internal Medicine

## 2021-12-11 LAB — CULTURE, BLOOD (ROUTINE X 2): Special Requests: ADEQUATE

## 2021-12-11 LAB — GLUCOSE, CAPILLARY
Glucose-Capillary: 152 mg/dL — ABNORMAL HIGH (ref 70–99)
Glucose-Capillary: 122 mg/dL — ABNORMAL HIGH (ref 70–99)
Glucose-Capillary: 109 mg/dL — ABNORMAL HIGH (ref 70–99)
Glucose-Capillary: 164 mg/dL — ABNORMAL HIGH (ref 70–99)

## 2021-12-11 MED ORDER — AMOXICILLIN 500 MG PO CAPS: 500.0000 mg | ORAL_CAPSULE | Freq: Three times a day (TID) | ORAL | 0 refills | Status: AC

## 2021-12-11 MED ORDER — CLONIDINE HCL 0.1 MG PO TABS: 0.1000 mg | ORAL_TABLET | Freq: Two times a day (BID) | ORAL | 11 refills | Status: AC

## 2021-12-11 MED ORDER — POLYSACCHARIDE IRON COMPLEX 150 MG PO CAPS
150.0000 mg | ORAL_CAPSULE | Freq: Every day | ORAL | Status: DC
  Administered 2021-12-11 – 2021-12-12 (×2): 150 mg via ORAL
  Filled 2021-12-11 (×2): qty 1

## 2021-12-11 MED ORDER — SITAGLIPTIN PHOSPHATE 25 MG PO TABS: 25.0000 mg | ORAL_TABLET | Freq: Every day | ORAL | 1 refills | Status: DC

## 2021-12-11 MED ORDER — METOPROLOL TARTRATE 100 MG PO TABS: 100.0000 mg | ORAL_TABLET | Freq: Two times a day (BID) | ORAL | 2 refills | Status: DC

## 2021-12-11 MED ORDER — INSULIN GLARGINE-YFGN 100 UNIT/ML ~~LOC~~ SOLN
30.0000 [IU] | Freq: Two times a day (BID) | SUBCUTANEOUS | Status: DC
  Administered 2021-12-11 – 2021-12-12 (×3): 30 [IU] via SUBCUTANEOUS
  Filled 2021-12-11 (×5): qty 0.3

## 2021-12-11 MED ORDER — ADULT MULTIVITAMIN W/MINERALS CH: 1.0000 | ORAL_TABLET | Freq: Every day | ORAL | 3 refills | Status: AC

## 2021-12-11 MED ORDER — POLYSACCHARIDE IRON COMPLEX 150 MG PO CAPS: 150.0000 mg | ORAL_CAPSULE | Freq: Every day | ORAL | 4 refills | Status: DC

## 2021-12-11 MED ORDER — OXYCODONE-ACETAMINOPHEN 5-325 MG PO TABS: 1.0000 | ORAL_TABLET | Freq: Four times a day (QID) | ORAL | 0 refills | Status: DC | PRN

## 2021-12-11 MED ORDER — NYSTATIN 100000 UNIT/GM EX POWD: 1.0000 "application " | Freq: Two times a day (BID) | CUTANEOUS | 0 refills | Status: DC

## 2021-12-11 NOTE — Discharge Instructions (Signed)
12/04/21 1000   Negative pressure wound therapy  Every Mon-Wed-Fri 1000     Comments: Will apply to sacral wound when all equip, supplies available  Question Answer Comment  Amount of suction? 125 mm/Hg    Suction Type? Continuous       12/02/21 1235     12/03/21 0500   Wound care  Daily        12/02/21 1037    12/02/21 1237   Apply dressing  Daily       Comments: Sacral wound.  Cover area with mepilex   12/02/21 1236

## 2021-12-11 NOTE — Discharge Summary (Signed)
[facility] NAME: Heather Lopez    MR#:  [id number]  DATE OF BIRTH:  October 31, 1946  DATE OF ADMISSION:  12/02/2021 ADMITTING PHYSICIAN: Bernadette Hoit, DO  DATE OF DISCHARGE: 12/11/2021  PRIMARY CARE PHYSICIAN: [name], PA-C    ADMISSION DIAGNOSIS:  Hyperkalemia [E87.5] UTI (urinary tract infection) [N39.0] Acute UTI [N39.0] AKI (acute kidney injury) ([place]) [N17.9] Sacral decubitus ulcer, stage IV ([place]) [L89.154] Acute osteomyelitis of coccyx ([place]) [M46.28]  DISCHARGE DIAGNOSIS:  severe sepsis due to complicated urine infection status post bilateral retrograde by Pyelogram with bilateral ureteral stent placement and laser lithotripsy of multiple renal stones on the left and right  SECONDARY DIAGNOSIS:   Past Medical History:  Diagnosis Date   Anxiety    Cataract    Depression    Diabetes mellitus without complication ([place])    Gout    History of kidney stones    Hyperlipidemia    Hypertension    Vertigo     HOSPITAL COURSE:   76 yo female with the past medical history of HTN, dyslipidemia, T2DM, sp bilateral ureteral stents, left UPJ calculus, right hydronephrosis, pelvic abscess, candida urine infection, obesity and duodenal ulcer who presented with pelvic pain. Reported worsening lower abdominal pain, for the last several days, more intense over 24 hours leading into her hospitalization   She has a chronic indwelling foley cathter summer 2022 and chronic right upper extremity PICC line fall 2022.    --Urology exchanged Foley.  Initial bilateral hydronephrosis seen on imaging has completely resolved on follow-up renal ultrasound.   --General surgery engaged for evaluation of sacral wound.  -- Wound VAC applied at bedside.  Per last general surgery note wound checks with dressing change Monday Wednesday Friday.   Severe sepsis due to complicated urinary tract infection (obstructive uropathy and nephrolithiasis), present  on admission, end organ failure AKI/ acute metabolic encephalopathy.  Urine infection associated with chronic indwelling foley catheter. --Patient remains somnolent --Leukocytosis improving --Urine culture positive for Proteus mirabilis --Blood cultures no growth to date --Foley catheter has been exchanged, appreciate urology consultation. Per urology patient will need bilateral stent exchange as outpatient --Follow-up renal ultrasound shows resolution of hydronephrosis -- patient currently on IV Rocephin. ID managing IV antibiotics.  --1/11-- will continue IV ampicillin through peripheral line. Discussed with urology patient will get her stone treatment and stent exchanged on Friday by Dr Diamantina Providence. --1/12-- no new issues. NPO after midnight tonight --1/13--for Urological procedure today --1/14-- s/p Cystoscopy bilateral retrograde pyelograms with intraoperative interpretation, bilateral ureteral stent placement Left ureteroscopy, laser lithotripsy of multiple left renal stones Right ureteroscopy, laser lithotripsy of right renal stone Intraoperative findings:  Erythematous bladder, no suspicious lesions Uncomplicated dusting of 3 large left renal stones Uncomplicated dusting of 1 large right lower pole stone Bilateral ureteral stent placement and Foley replacement --per Urology ok to remove foley in am 1/15--d/w pt and husband  1/15-- chronic Foley catheter to be removed today. Confirm with patient she would like to go home. Hospital bed to be delivered tomorrow. 1/16-- requested Foley be removed just prior to discharge. Discussed with her regarding issues with urine retention given long-standing Foley in place. Patient did tolerate bladder training. Will discontinue Foley before discharge.    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 Hyponatremia.  --Foley catheter has been exchanged. -Improvement in renal function  and metabolic parameters --DC bicarbonate infusion --Avoid hypotension and nephrotoxins --good UOP.  -  creat 1.52   Stage IV sacrum pressure ulcer, chronic coccygeal osteomyelitis.  --Wound VAC in place--wound looks better --General surgery follow-up, dressing changes MWF -- patient will follow-up with Dr. Lysle Pearl as outpatient.   T2DM/ dyslipidemia -- insulin sliding scale.  --Basal insulin with levemir 30 units bid, Januvia (home med) --Continue atorvastatin.    HTN  --Restarting home regimen with Metoprolol,Clonidine --As needed IV hydralazine   Obesity class 2 -- calculated BMI is 35,9    Mild protein calorie malnutrition/ non ambulatory state.  Cont RD recs. Per husband pt eating better   anemia of chronic disease -- patient used to get IV iron infusion in the past. Currently hemoglobin stable. I will place her on iron polysaccharide.     DVT prophylaxis: SQ Lovenox Code Status: DNR Family Communication: husband at bedside Disposition Plan: Status is: Inpatient   discussed with patient and husband regarding discharge plan. Went over her medications and not needed meds were discontinued. CONSULTS OBTAINED:  Treatment Team:  Hollice Espy, MD  DRUG ALLERGIES:   Allergies  Allergen Reactions   Contrast Media  [Iodinated Contrast Media] Anaphylaxis   Iodine Swelling    (IV only) - angioedema    DISCHARGE MEDICATIONS:   Allergies as of 12/11/2021       Reactions   Contrast Media  [iodinated Contrast Media] Anaphylaxis   Iodine Swelling   (IV only) - angioedema        Medication List     STOP taking these medications    acetaminophen 325 MG tablet Commonly known as: TYLENOL   amLODipine 10 MG tablet Commonly known as: NORVASC   ascorbic acid 500 MG tablet Commonly known as: VITAMIN C   ciprofloxacin 250 MG tablet Commonly known as: CIPRO   doxycycline 100 MG capsule Commonly known as: VIBRAMYCIN   epoetin alfa 40000 UNIT/ML  injection Commonly known as: EPOGEN   feeding supplement (GLUCERNA 1.5 CAL) Liqd   ferrous sulfate 220 (44 Fe) MG/5ML solution   free water Soln   guaiFENesin 100 MG/5ML liquid Commonly known as: ROBITUSSIN   heparin 5000 UNIT/ML injection   insulin aspart 100 UNIT/ML injection Commonly known as: novoLOG   insulin glargine 100 UNIT/ML injection Commonly known as: LANTUS   Lidocaine 4 % Ptch   mirtazapine 15 MG tablet Commonly known as: REMERON   nutrition supplement (JUVEN) Pack   pantoprazole sodium 40 mg Commonly known as: PROTONIX   potassium chloride SA 20 MEQ tablet Commonly known as: KLOR-CON M   sodium chloride flush 0.9 % Soln Commonly known as: NS   Torsemide 40 MG Tabs       TAKE these medications    amoxicillin 500 MG capsule Commonly known as: AMOXIL Take 1 capsule (500 mg total) by mouth every 8 (eight) hours for 11 doses.   atorvastatin 20 MG tablet Commonly known as: LIPITOR Take 1 tablet (20 mg total) by mouth daily. What changed:  how much to take when to take this   buPROPion 100 MG tablet Commonly known as: WELLBUTRIN Place 100 mg into feeding tube daily.   busPIRone 5 MG tablet Commonly known as: BUSPAR Place 5 mg into feeding tube daily.   cloNIDine 0.1 MG tablet Commonly known as: CATAPRES Take 1 tablet (0.1 mg total) by mouth 2 (two) times daily. What changed:  how to take this when to take this Another medication with the same name was removed. Continue taking this medication, and follow the directions you see here.   fluticasone  50 MCG/ACT nasal spray Commonly known as: FLONASE Place 1 spray into both nostrils daily.   insulin detemir 100 UNIT/ML injection Commonly known as: LEVEMIR Inject 0.3 mLs (30 Units total) into the skin 2 (two) times daily.   insulin regular 100 units/mL injection Commonly known as: NOVOLIN R Inject 0-10 Units into the skin 4 (four) times daily -  before meals and at bedtime. (Based upon  sliding scale)   iron polysaccharides 150 MG capsule Commonly known as: NIFEREX Take 1 capsule (150 mg total) by mouth daily.   lansoprazole 30 MG capsule Commonly known as: PREVACID Take 30 mg by mouth at bedtime.   loperamide 2 MG tablet Commonly known as: IMODIUM A-D Take 2 mg by mouth every 6 (six) hours as needed for diarrhea or loose stools.   metoprolol tartrate 100 MG tablet Commonly known as: LOPRESSOR Take 1 tablet (100 mg total) by mouth 2 (two) times daily. What changed: how to take this   multivitamin with minerals Tabs tablet Take 1 tablet by mouth daily. Start taking on: December 12, 2021   nystatin powder Commonly known as: MYCOSTATIN/NYSTOP Apply 1 application topically 2 (two) times daily for 13 days. (Apply under the breasts)   oxyCODONE-acetaminophen 5-325 MG tablet Commonly known as: PERCOCET/ROXICET Take 1 tablet by mouth every 6 (six) hours as needed for moderate pain or severe pain.   polyethylene glycol 17 g packet Commonly known as: MIRALAX / GLYCOLAX Take 17 g by mouth daily.   senna-docusate 8.6-50 MG tablet Commonly known as: Senokot-S Place 1 tablet into feeding tube 2 (two) times daily.   sitaGLIPtin 25 MG tablet Commonly known as: JANUVIA Place 1 tablet (25 mg total) into feeding tube daily.               Durable Medical Equipment  (From admission, onward)           Start     Ordered   12/09/21 1211  For home use only DME 3 n 1  Once        12/09/21 1211   12/09/21 1211  For home use only DME Shower stool  Once        12/09/21 1211   12/09/21 1211  For home use only DME Hospital bed  Once       Question Answer Comment  Length of Need Lifetime   Bed type Semi-electric      12/09/21 1211              Discharge Care Instructions  (From admission, onward)           Start     Ordered   12/11/21 0000  Discharge wound care:       Comments: As per nursing instruciton   12/11/21 1108            If you  experience worsening of your admission symptoms, develop shortness of breath, life threatening emergency, suicidal or homicidal thoughts you must seek medical attention immediately by calling 911 or calling your MD immediately  if symptoms less severe.  You Must read complete instructions/literature along with all the possible adverse reactions/side effects for all the Medicines you take and that have been prescribed to you. Take any new Medicines after you have completely understood and accept all the possible adverse reactions/side effects.   Please note  You were cared for by a hospitalist during your hospital stay. If you have any questions about your discharge medications or the care you  received while you were in the hospital after you are discharged, you can call the unit and asked to speak with the hospitalist on call if the hospitalist that took care of you is not available. Once you are discharged, your primary care physician will handle any further medical issues. Please note that NO REFILLS for any discharge medications will be authorized once you are discharged, as it is imperative that you return to your primary care physician (or establish a relationship with a primary care physician if you do not have one) for your aftercare needs so that they can reassess your need for medications and monitor your lab values. Today   SUBJECTIVE   no new complaints. Husband at bedside  VITAL SIGNS:  Blood pressure (!) 143/63, pulse 71, temperature 98.2 F (36.8 C), resp. rate 16, height 5\' 3"  (1.6 m), weight 87.2 kg, SpO2 95 %.  I/O:   Intake/Output Summary (Last 24 hours) at 12/11/2021 1111 Last data filed at 12/11/2021 1036 Gross per 24 hour  Intake 120 ml  Output 2800 ml  Net -2680 ml    PHYSICAL EXAMINATION:  GENERAL:  76 y.o.-year-old patient lying in the bed with no acute distress. sheis deconditioned HEENT: Head atraumatic, normocephalic. Oropharynx and nasopharynx clear.  LUNGS:  Normal breath sounds bilaterally, no wheezing, rales, rhonchi. CARDIOVASCULAR: S1, S2 normal. No murmurs, rubs, or gallops.  ABDOMEN: Soft, nontender, nondistended. Bowel sounds present. EXTREMITIES: edema+ mild  NEUROLOGIC: nonfocal PSYCHIATRIC:  patient alert and awake SKIN:    Pressure Injury 07/07/21 Buttocks Stage 2 -  Partial thickness loss of dermis presenting as a shallow open injury with a red, pink wound bed without slough. (Active)  07/07/21 0800  Location: Buttocks  Location Orientation:   Staging: Stage 2 -  Partial thickness loss of dermis presenting as a shallow open injury with a red, pink wound bed without slough.  Wound Description (Comments):   Present on Admission: No     Pressure Injury 07/24/21 Coccyx Mid Unstageable - Full thickness tissue loss in which the base of the injury is covered by slough (yellow, tan, gray, green or brown) and/or eschar (tan, brown or black) in the wound bed. Unstageable Sacral Wound (Active)  07/24/21 0000  Location: Coccyx  Location Orientation: Mid  Staging: Unstageable - Full thickness tissue loss in which the base of the injury is covered by slough (yellow, tan, gray, green or brown) and/or eschar (tan, brown or black) in the wound bed.  Wound Description (Comments): Unstageable Sacral Wound  Present on Admission:      Pressure Injury 07/30/21 Heel Right Stage 1 -  Intact skin with non-blanchable redness of a localized area usually over a bony prominence. (Active)  07/30/21 0730  Location: Heel  Location Orientation: Right  Staging: Stage 1 -  Intact skin with non-blanchable redness of a localized area usually over a bony prominence.  Wound Description (Comments):   Present on Admission:      Pressure Injury 12/03/21 Sacrum Mid Stage 4 - Full thickness tissue loss with exposed bone, tendon or muscle. (Active)  12/03/21 0200  Location: Sacrum  Location Orientation: Mid  Staging: Stage 4 - Full thickness tissue loss with exposed  bone, tendon or muscle.  Wound Description (Comments):   Present on Admission: Yes   DATA REVIEW:   CBC  No results for input(s): WBC, HGB, HCT, PLT in the last 168 hours.  Chemistries  Recent Labs  Lab 12/06/21 0557  CREATININE 1.52*    Microbiology Results  Recent Results (from the past 240 hour(s))  Culture, blood (Routine X 2) w Reflex to ID Panel     Status: None (Preliminary result)   Collection Time: 12/02/21  6:09 AM   Specimen: BLOOD  Result Value Ref Range Status   Specimen Description   Final    BLOOD RIGHT ANTECUBITAL Performed at [facility], [address], [place], [place] [address]    Special Requests   Final    BOTTLES DRAWN AEROBIC AND ANAEROBIC Blood Culture adequate volume Performed at [facility], [address] [place], [place] [address]    Culture  Setup Time   Final    AEROBIC BOTTLE ONLY GRAM POSITIVE RODS CRITICAL RESULT CALLED TO, READ BACK BY AND VERIFIED WITH: NATHAN BELUE @ 2585 ON 12/07/2021.Marland KitchenMarland KitchenTKR Performed at [facility], [address], [place], [place] [address]    Culture   Final    Lonell Grandchild POSITIVE RODS IDENTIFICATION TO FOLLOW Performed at [facility], [place] [address] [place], [place] [address]    Report Status PENDING  Incomplete  Urine Culture     Status: Abnormal   Collection Time: 12/02/21  6:09 AM   Specimen: Urine, Catheterized  Result Value Ref Range Status   Specimen Description   Final    URINE, CATHETERIZED Performed at [facility], [address] [place], [place] [address]    Special Requests   Final    NONE Performed at [facility], [place], [place] [address]    Culture 30,000 COLONIES/mL PROTEUS MIRABILIS (A)  Final   Report Status 12/04/2021 FINAL  Final   Organism ID, Bacteria PROTEUS MIRABILIS (A)  Final      Susceptibility   Proteus mirabilis - MIC*    AMPICILLIN <=2 SENSITIVE Sensitive     CEFAZOLIN 8 SENSITIVE Sensitive      CEFEPIME <=0.12 SENSITIVE Sensitive     CEFTRIAXONE <=0.25 SENSITIVE Sensitive     CIPROFLOXACIN 1 RESISTANT Resistant     GENTAMICIN <=1 SENSITIVE Sensitive     IMIPENEM 4 SENSITIVE Sensitive     NITROFURANTOIN 128 RESISTANT Resistant     TRIMETH/SULFA <=20 SENSITIVE Sensitive     AMPICILLIN/SULBACTAM <=2 SENSITIVE Sensitive     PIP/TAZO <=4 SENSITIVE Sensitive     * 30,000 COLONIES/mL PROTEUS MIRABILIS  Resp Panel by RT-PCR (Flu A&B, Covid) Nasopharyngeal Swab     Status: None   Collection Time: 12/02/21  7:48 AM   Specimen: Nasopharyngeal Swab; Nasopharyngeal(NP) swabs in vial transport medium  Result Value Ref Range Status   SARS Coronavirus 2 by RT PCR NEGATIVE NEGATIVE Final    Comment: (NOTE) SARS-CoV-2 target nucleic acids are NOT DETECTED.  The SARS-CoV-2 RNA is generally detectable in upper respiratory specimens during the acute phase of infection. The lowest concentration of SARS-CoV-2 viral copies this assay can detect is 138 copies/mL. A negative result does not preclude SARS-Cov-2 infection and should not be used as the sole basis for treatment or other patient management decisions. A negative result may occur with  improper specimen collection/handling, submission of specimen other than nasopharyngeal swab, presence of viral mutation(s) within the areas targeted by this assay, and inadequate number of viral copies(<138 copies/mL). A negative result must be combined with clinical observations, patient history, and epidemiological information. The expected result is Negative.  Fact Sheet for Patients:  EntrepreneurPulse.com.au  Fact Sheet for Healthcare Providers:  IncredibleEmployment.be  This test is no t yet approved or cleared by the [place]  States FDA and  has been authorized for detection and/or diagnosis of SARS-CoV-2 by FDA under an Emergency Use Authorization (EUA). This EUA will remain  in effect (meaning this test can  be used) for the duration of the COVID-19 declaration under Section 564(b)(1) of the Act, 21 U.S.C.section 360bbb-3(b)(1), unless the authorization is terminated  or revoked sooner.       Influenza A by PCR NEGATIVE NEGATIVE Final   Influenza B by PCR NEGATIVE NEGATIVE Final    Comment: (NOTE) The Xpert Xpress SARS-CoV-2/FLU/RSV plus assay is intended as an aid in the diagnosis of influenza from Nasopharyngeal swab specimens and should not be used as a sole basis for treatment. Nasal washings and aspirates are unacceptable for Xpert Xpress SARS-CoV-2/FLU/RSV testing.  Fact Sheet for Patients: EntrepreneurPulse.com.au  Fact Sheet for Healthcare Providers: IncredibleEmployment.be  This test is not yet approved or cleared by the [place] FDA and has been authorized for detection and/or diagnosis of SARS-CoV-2 by FDA under an Emergency Use Authorization (EUA). This EUA will remain in effect (meaning this test can be used) for the duration of the COVID-19 declaration under Section 564(b)(1) of the Act, 21 U.S.C. section 360bbb-3(b)(1), unless the authorization is terminated or revoked.  Performed at [facility], [address] [place], [place] [address]   Culture, blood (Routine X 2) w Reflex to ID Panel     Status: None   Collection Time: 12/02/21  2:42 PM   Specimen: BLOOD  Result Value Ref Range Status   Specimen Description BLOOD LEFT ANTECUBITAL  Final   Special Requests Blood Culture adequate volume  Final   Culture   Final    NO GROWTH 5 DAYS Performed at [facility], [address] [place], [place] [address]    Report Status 12/07/2021 FINAL  Final  Surgical pcr screen     Status: None   Collection Time: 12/04/21  6:39 PM   Specimen: Nasal Mucosa; Nasal Swab  Result Value Ref Range Status   MRSA, PCR NEGATIVE NEGATIVE Final   Staphylococcus aureus NEGATIVE NEGATIVE Final    Comment: (NOTE) The Xpert  SA Assay (FDA approved for NASAL specimens in patients 59 years of age and older), is one component of a comprehensive surveillance program. It is not intended to diagnose infection nor to guide or monitor treatment. Performed at [facility], [address] [place],  [address]     RADIOLOGY:  No results found.   CODE STATUS:     Code Status Orders  (From admission, onward)           Start     Ordered   12/02/21 1034  Do not attempt resuscitation (DNR)  Continuous       Question Answer Comment  In the event of cardiac or respiratory ARREST Do not call a code blue   In the event of cardiac or respiratory ARREST Do not perform Intubation, CPR, defibrillation or ACLS   In the event of cardiac or respiratory ARREST Use medication by any route, position, wound care, and other measures to relive pain and suffering. May use oxygen, suction and manual treatment of airway obstruction as needed for comfort.      12/02/21 1033           Code Status History     Date Active Date Inactive Code Status Order ID Comments User Context   07/12/2021 1523 08/03/2021 2026 DNR [id number]  Flora Lipps, MD Inpatient   06/27/2021 [phone] 07/12/2021  1523 Full Code [id number]  Rise Patience, MD ED        TOTAL TIME TAKING CARE OF THIS PATIENT: 40 minutes.    Fritzi Mandes M.D  [facility]    CC: Primary care physician; [name], PA-C

## 2021-12-11 NOTE — Care Management Important Message (Signed)
Important Message  Patient Details  Name: Heather Lopez MRN: [id number] Date of Birth: [birth year]   Medicare Important Message Given:  Yes  Reviewed Medicare IM with patient via room phone.     [name] 12/11/2021, 3:38 PM

## 2021-12-11 NOTE — Progress Notes (Cosign Needed)
Patient has impaired mobility and sacral decubitis which requires buttocks to be positioned in ways not feasible with a normal bed. Impaired mobility and Wound frequently requires frequent changes in body position which cannot be achieved with a normal bed.

## 2021-12-11 NOTE — Progress Notes (Signed)
PT Cancellation Note  Patient Details Name: Heather Lopez MRN: [id number] DOB: Aug 19, 1946   Cancelled Treatment:    Reason Eval/Treat Not Completed: Patient declined to participate with PT services this date secondary to just having had wound vac placed and not wanting to sit up.  Offered bed level therex which pt again declined. Will attempt to see pt at a future date/time as medically appropriate.     Linus Salmons PT, DPT 12/11/21, 2:12 PM

## 2021-12-11 NOTE — Plan of Care (Signed)

## 2021-12-11 NOTE — Progress Notes (Signed)
I have told by social worker that patient's wound VAC for home needs to be ordered. She will not be able to go home today. Will hold off discharge.  Will leave Foley catheter and per patient's request. Discontinue Foley when patient goes home.

## 2021-12-11 NOTE — TOC Progression Note (Addendum)
Transition of Care [facility]) - Progression Note    Patient Details  Name: KATANYA SCHLIE MRN: [id number] Date of Birth: 11-29-1945  Transition of Care [facility]) CM/SW [place], RN Phone Number: 12/11/2021, 12:39 PM  Clinical Narrative:   Reached out to Olivia Mackie at [place] to get wound vac  Awaiting delivery, Has [facility] set up, has a RW and a 3 in 1, needs a transfer bench Emailed the wound vac order to [place] at [employer], Soyla Murphy   Expected Discharge Plan: [place] Barriers to Discharge: Continued Medical Work up  Expected Discharge Plan and Services Expected Discharge Plan: [place] Choice: Resumption of Svcs/PTA Provider Living arrangements for the past 2 months: [place] Expected Discharge Date: 12/11/21               DME Arranged: 3-N-1, Walker rolling, Shower stool, Hospital bed (Purchased RW/3:1/Shower seat.) DME Agency: [employer] (For hospital bed, should be set up Monday am.) Date DME Agency Contacted: 12/09/21 Time DME Agency Contacted: 1243 Representative spoke with at DME Agency: [place]: RN, PT, OT ([place] aide) [place]: [place] ([place]) Date [place]: 12/09/21 Time [place]: 1244 Representative spoke with at [place]: [place] (SDOH) Interventions    Readmission Risk Interventions Readmission Risk Prevention Plan 12/03/2021  Transportation Screening Complete  Medication Review Press photographer) Complete  PCP or Specialist appointment within 3-5 days of discharge Complete  SW Recovery Care/Counseling Consult Complete  [place] Complete  Some recent data might be hidden

## 2021-12-11 NOTE — Telephone Encounter (Signed)
Husband called to cancel pt's appt for 1-17. Pt is still in hospital. Call back at [phone]

## 2021-12-11 NOTE — Progress Notes (Signed)
Inpatient Diabetes Program Recommendations  AACE/ADA: New Consensus Statement on Inpatient Glycemic Control (2015)  Target Ranges:  Prepandial:   less than 140 mg/dL      Peak postprandial:   less than 180 mg/dL (1-2 hours)      Critically ill patients:  140 - 180 mg/dL   Lab Results  Component Value Date   GLUCAP 336 (H) 12/10/2021   HGBA1C 5.9 (H) 12/02/2021    Review of Glycemic Control  Latest Reference Range & Units 12/09/21 08:12 12/09/21 12:11 12/09/21 16:05 12/09/21 20:43 12/10/21 09:14 12/10/21 12:09 12/10/21 18:05 12/10/21 21:09  Glucose-Capillary 70 - 99 mg/dL 327 (H) 423 (H) 384 (H) 366 (H) 253 (H) 256 (H) 239 (H) 336 (H)   Diabetes history: DM 2 Outpatient Diabetes medications: Lantus 40 units bid, Januvia 25 mg Daily Current orders for Inpatient glycemic control:  Semglee 20 units bid Novolog 0-15 units tid + hs  Inpatient Diabetes Program Recommendations:    - Increase Semglee to 30 units bid  Thanks,  Tama Headings RN, MSN, BC-ADM Inpatient Diabetes Coordinator Team Pager [phone] (8a-5p)

## 2021-12-12 ENCOUNTER — Encounter: Payer: Self-pay | Admitting: Internal Medicine

## 2021-12-12 ENCOUNTER — Inpatient Hospital Stay: Payer: Medicare Other

## 2021-12-12 LAB — GLUCOSE, CAPILLARY
Glucose-Capillary: 154 mg/dL — ABNORMAL HIGH (ref 70–99)
Glucose-Capillary: 183 mg/dL — ABNORMAL HIGH (ref 70–99)

## 2021-12-12 NOTE — TOC Progression Note (Signed)
Transition of Care [facility]) - Progression Note    Patient Details  Name: Heather Lopez MRN: [id number] Date of Birth: 1946-11-19  Transition of Care [facility]) CM/SW [place], RN Phone Number: 12/12/2021, 12:09 PM  Clinical Narrative:   Wound Vac has arrived, the patient has DME at home, EMS called to transport Home Patient is a DNR, Husband in the room and awareis 1 ahead of her    Expected Discharge Plan: [place] Barriers to Discharge: Continued Medical Work up  Expected Discharge Plan and Services Expected Discharge Plan: [place] Choice: Resumption of Svcs/PTA Provider Living arrangements for the past 2 months: [place] Expected Discharge Date: 12/12/21               DME Arranged: 3-N-1, Walker rolling, Shower stool, Hospital bed (Purchased RW/3:1/Shower seat.) DME Agency: [facility] (For hospital bed, should be set up Monday am.) Date DME Agency Contacted: 12/09/21 Time DME Agency Contacted: 1243 Representative spoke with at DME Agency: [place]: RN, PT, OT ([place] aide) [place]: [place] ([place]) Date [place]: 12/09/21 Time [place]: 1244 Representative spoke with at [place]: [place] (SDOH) Interventions    Readmission Risk Interventions Readmission Risk Prevention Plan 12/03/2021  Transportation Screening Complete  Medication Review Press photographer) Complete  PCP or Specialist appointment within 3-5 days of discharge Complete  SW Recovery Care/Counseling Consult Complete  [place] Complete  Some recent data might be hidden

## 2021-12-12 NOTE — Discharge Summary (Signed)
[place] [facility] [place] NAME: Heather Lopez    MR#:  [id number]  DATE OF BIRTH:  08/31/1946  DATE OF ADMISSION:  12/02/2021 ADMITTING PHYSICIAN: Bernadette Hoit, DO  DATE OF DISCHARGE: 12/12/2021  PRIMARY CARE PHYSICIAN: [name], PA-C    ADMISSION DIAGNOSIS:  Hyperkalemia [E87.5] UTI (urinary tract infection) [N39.0] Acute UTI [N39.0] AKI (acute kidney injury) ([place]) [N17.9] Sacral decubitus ulcer, stage IV (HCC) [L89.154] Acute osteomyelitis of coccyx ([place]) [M46.28]  DISCHARGE DIAGNOSIS:  Severe sepsis due to complicated urine infection status post bilateral retrograde by Pyelogram with bilateral ureteral stent placement and laser lithotripsy of multiple renal stones on the left and right  SECONDARY DIAGNOSIS:   Past Medical History:  Diagnosis Date   Anxiety    Cataract    Depression    Diabetes mellitus without complication ([place])    Gout    History of kidney stones    Hyperlipidemia    Hypertension    Vertigo     HOSPITAL COURSE:   76 yo female with the past medical history of HTN, dyslipidemia, T2DM, sp bilateral ureteral stents, left UPJ calculus, right hydronephrosis, pelvic abscess, candida urine infection, obesity and duodenal ulcer who presented with pelvic pain. Reported worsening lower abdominal pain, for the last several days, more intense over 24 hours leading into her hospitalization   She has a chronic indwelling foley cathter summer 2022 and chronic right upper extremity PICC line fall 2022.    --Urology exchanged Foley.  Initial bilateral hydronephrosis seen on imaging has completely resolved on follow-up renal ultrasound.   --General surgery engaged for evaluation of sacral wound.  -- Wound VAC applied at bedside.  Per last general surgery note wound checks with dressing change Monday Wednesday Friday.   Severe sepsis due to complicated urinary tract infection (obstructive uropathy and nephrolithiasis), present  on admission, end organ failure AKI/ acute metabolic encephalopathy.  Urine infection associated with chronic indwelling foley catheter. --Patient remains somnolent --Leukocytosis improving --Urine culture positive for Proteus mirabilis --Blood cultures no growth to date --Foley catheter has been exchanged, appreciate urology consultation. Per urology patient will need bilateral stent exchange as outpatient --Follow-up renal ultrasound shows resolution of hydronephrosis -- patient currently on IV Rocephin. ID managing IV antibiotics.  --1/11-- will continue IV ampicillin through peripheral line. Discussed with urology patient will get her stone treatment and stent exchanged on Friday by Dr Diamantina [place]. --1/12-- no new issues. NPO after midnight tonight --1/13--for Urological procedure today --1/14-- s/p Cystoscopy bilateral retrograde pyelograms with intraoperative interpretation, bilateral ureteral stent placement Left ureteroscopy, laser lithotripsy of multiple left renal stones Right ureteroscopy, laser lithotripsy of right renal stone Intraoperative findings:  Erythematous bladder, no suspicious lesions Uncomplicated dusting of 3 large left renal stones Uncomplicated dusting of 1 large right lower pole stone Bilateral ureteral stent placement and Foley replacement --per Urology ok to remove foley in am 1/15--d/w pt and husband  1/15-- chronic Foley catheter to be removed today. Confirm with patient she would like to go home. Hospital bed to be delivered tomorrow. 1/16-- requested Foley be removed just prior to discharge. Discussed with her regarding issues with urine retention given long-standing Foley in place. Patient did tolerate bladder training. Will discontinue Foley before discharge.  1/17-- discharge was held since patient did not have her wound VAC for home. She will get wound VAC placed today and then will discharged home. Foley will be removed prior to discharge.   AKI on CKD sage  3b,  obstructive uropathy, bilateral hydronephrosis, hyperkalemia, non anion gap metabolic acidosis. Hyponatremia.  --Foley catheter has been exchanged. -Improvement in renal function and metabolic parameters --DC bicarbonate infusion --Avoid hypotension and nephrotoxins --good UOP.  -creat 1.52   Stage IV sacrum pressure ulcer, chronic coccygeal osteomyelitis.  --Wound VAC in place--wound looks better --General surgery follow-up, dressing changes MWF -- patient will follow-up with Dr. Lysle Pearl as outpatient.   T2DM/ dyslipidemia -- insulin sliding scale.  --Basal insulin with levemir 30 units bid, Januvia (home med) --Continue atorvastatin.    HTN  --Restarting home regimen with Metoprolol,Clonidine --As needed IV hydralazine   Obesity class 2 -- calculated BMI is 35,9    Mild protein calorie malnutrition/ non ambulatory state.  Cont RD recs. Per husband pt eating better   anemia of chronic disease -- patient used to get IV iron infusion in the past. Currently hemoglobin stable. I will place her on iron polysaccharide.     DVT prophylaxis: SQ Lovenox Code Status: DNR Family Communication: husband at bedside Disposition Plan: Status is: Inpatient   discussed with patient and husband regarding discharge plan. Went over her medications and not needed meds were discontinued. CONSULTS OBTAINED:  Treatment Team:  Hollice Espy, MD  DRUG ALLERGIES:   Allergies  Allergen Reactions   Contrast Media  [Iodinated Contrast Media] Anaphylaxis   Iodine Swelling    (IV only) - angioedema    DISCHARGE MEDICATIONS:   Allergies as of 12/12/2021       Reactions   Contrast Media  [iodinated Contrast Media] Anaphylaxis   Iodine Swelling   (IV only) - angioedema        Medication List     STOP taking these medications    acetaminophen 325 MG tablet Commonly known as: TYLENOL   amLODipine 10 MG tablet Commonly known as: NORVASC   ascorbic acid 500 MG tablet Commonly  known as: VITAMIN C   ciprofloxacin 250 MG tablet Commonly known as: CIPRO   doxycycline 100 MG capsule Commonly known as: VIBRAMYCIN   epoetin alfa 40000 UNIT/ML injection Commonly known as: EPOGEN   feeding supplement (GLUCERNA 1.5 CAL) Liqd   ferrous sulfate 220 (44 Fe) MG/5ML solution   free water Soln   guaiFENesin 100 MG/5ML liquid Commonly known as: ROBITUSSIN   heparin 5000 UNIT/ML injection   insulin aspart 100 UNIT/ML injection Commonly known as: novoLOG   insulin glargine 100 UNIT/ML injection Commonly known as: LANTUS   Lidocaine 4 % Ptch   mirtazapine 15 MG tablet Commonly known as: REMERON   nutrition supplement (JUVEN) Pack   pantoprazole sodium 40 mg Commonly known as: PROTONIX   potassium chloride SA 20 MEQ tablet Commonly known as: KLOR-CON M   sodium chloride flush 0.9 % Soln Commonly known as: NS   Torsemide 40 MG Tabs       TAKE these medications    amoxicillin 500 MG capsule Commonly known as: AMOXIL Take 1 capsule (500 mg total) by mouth every 8 (eight) hours for 11 doses.   atorvastatin 20 MG tablet Commonly known as: LIPITOR Take 1 tablet (20 mg total) by mouth daily. What changed:  how much to take when to take this   buPROPion 100 MG tablet Commonly known as: WELLBUTRIN Place 100 mg into feeding tube daily.   busPIRone 5 MG tablet Commonly known as: BUSPAR Place 5 mg into feeding tube daily.   cloNIDine 0.1 MG tablet Commonly known as: CATAPRES Take 1 tablet (0.1 mg total) by mouth 2 (two) times  daily. What changed:  how to take this when to take this Another medication with the same name was removed. Continue taking this medication, and follow the directions you see here.   fluticasone 50 MCG/ACT nasal spray Commonly known as: FLONASE Place 1 spray into both nostrils daily.   insulin detemir 100 UNIT/ML injection Commonly known as: LEVEMIR Inject 0.3 mLs (30 Units total) into the skin 2 (two) times  daily.   insulin regular 100 units/mL injection Commonly known as: NOVOLIN R Inject 0-10 Units into the skin 4 (four) times daily -  before meals and at bedtime. (Based upon sliding scale)   iron polysaccharides 150 MG capsule Commonly known as: NIFEREX Take 1 capsule (150 mg total) by mouth daily.   lansoprazole 30 MG capsule Commonly known as: PREVACID Take 30 mg by mouth at bedtime.   loperamide 2 MG tablet Commonly known as: IMODIUM A-D Take 2 mg by mouth every 6 (six) hours as needed for diarrhea or loose stools.   metoprolol tartrate 100 MG tablet Commonly known as: LOPRESSOR Take 1 tablet (100 mg total) by mouth 2 (two) times daily. What changed: how to take this   multivitamin with minerals Tabs tablet Take 1 tablet by mouth daily.   nystatin powder Commonly known as: MYCOSTATIN/NYSTOP Apply 1 application topically 2 (two) times daily for 13 days. (Apply under the breasts)   oxyCODONE-acetaminophen 5-325 MG tablet Commonly known as: PERCOCET/ROXICET Take 1 tablet by mouth every 6 (six) hours as needed for moderate pain or severe pain.   polyethylene glycol 17 g packet Commonly known as: MIRALAX / GLYCOLAX Take 17 g by mouth daily.   senna-docusate 8.6-50 MG tablet Commonly known as: Senokot-S Place 1 tablet into feeding tube 2 (two) times daily.   sitaGLIPtin 25 MG tablet Commonly known as: JANUVIA Place 1 tablet (25 mg total) into feeding tube daily.               Durable Medical Equipment  (From admission, onward)           Start     Ordered   12/11/21 1241  For home use only DME Other see comment  Once       Comments: Transfer bench for the bathtub  Question:  Length of Need  Answer:  Lifetime   12/11/21 1241   12/09/21 1211  For home use only DME 3 n 1  Once        12/09/21 1211   12/09/21 1211  For home use only DME Shower stool  Once        12/09/21 1211   12/09/21 1211  For home use only DME Hospital bed  Once       Question Answer  Comment  Length of Need Lifetime   Bed type Semi-electric      12/09/21 1211              Discharge Care Instructions  (From admission, onward)           Start     Ordered   12/11/21 0000  Discharge wound care:       Comments: As per nursing instruciton   12/11/21 1108            If you experience worsening of your admission symptoms, develop shortness of breath, life threatening emergency, suicidal or homicidal thoughts you must seek medical attention immediately by calling 911 or calling your MD immediately  if symptoms less severe.  You Must read  complete instructions/literature along with all the possible adverse reactions/side effects for all the Medicines you take and that have been prescribed to you. Take any new Medicines after you have completely understood and accept all the possible adverse reactions/side effects.   Please note  You were cared for by a hospitalist during your hospital stay. If you have any questions about your discharge medications or the care you received while you were in the hospital after you are discharged, you can call the unit and asked to speak with the hospitalist on call if the hospitalist that took care of you is not available. Once you are discharged, your primary care physician will handle any further medical issues. Please note that NO REFILLS for any discharge medications will be authorized once you are discharged, as it is imperative that you return to your primary care physician (or establish a relationship with a primary care physician if you do not have one) for your aftercare needs so that they can reassess your need for medications and monitor your lab values. Today   SUBJECTIVE   no new complaints. Husband at bedside  VITAL SIGNS:  Blood pressure (!) 137/51, pulse 64, temperature 97.9 F (36.6 C), resp. rate 15, height 5\' 3"  (1.6 m), weight 87.2 kg, SpO2 96 %.  I/O:   Intake/Output Summary (Last 24 hours) at 12/12/2021  1020 Last data filed at 12/12/2021 0700 Gross per 24 hour  Intake 360 ml  Output 2100 ml  Net -1740 ml     PHYSICAL EXAMINATION:  GENERAL:  76 y.o.-year-old patient lying in the bed with no acute distress. sheis deconditioned HEENT: Head atraumatic, normocephalic. Oropharynx and nasopharynx clear.  LUNGS: Normal breath sounds bilaterally, no wheezing, rales, rhonchi. CARDIOVASCULAR: S1, S2 normal. No murmurs, rubs, or gallops.  ABDOMEN: Soft, nontender, nondistended. Bowel sounds present. EXTREMITIES: edema+ mild  NEUROLOGIC: nonfocal PSYCHIATRIC:  patient alert and awake SKIN:    Pressure Injury 07/07/21 Buttocks Stage 2 -  Partial thickness loss of dermis presenting as a shallow open injury with a red, pink wound bed without slough. (Active)  07/07/21 0800  Location: Buttocks  Location Orientation:   Staging: Stage 2 -  Partial thickness loss of dermis presenting as a shallow open injury with a red, pink wound bed without slough.  Wound Description (Comments):   Present on Admission: No     Pressure Injury 07/24/21 Coccyx Mid Unstageable - Full thickness tissue loss in which the base of the injury is covered by slough (yellow, tan, gray, green or brown) and/or eschar (tan, brown or black) in the wound bed. Unstageable Sacral Wound (Active)  07/24/21 0000  Location: Coccyx  Location Orientation: Mid  Staging: Unstageable - Full thickness tissue loss in which the base of the injury is covered by slough (yellow, tan, gray, green or brown) and/or eschar (tan, brown or black) in the wound bed.  Wound Description (Comments): Unstageable Sacral Wound  Present on Admission:      Pressure Injury 07/30/21 Heel Right Stage 1 -  Intact skin with non-blanchable redness of a localized area usually over a bony prominence. (Active)  07/30/21 0730  Location: Heel  Location Orientation: Right  Staging: Stage 1 -  Intact skin with non-blanchable redness of a localized area usually over a bony  prominence.  Wound Description (Comments):   Present on Admission:      Pressure Injury 12/03/21 Sacrum Mid Stage 4 - Full thickness tissue loss with exposed bone, tendon or muscle. (Active)  12/03/21 0200  Location: Sacrum  Location Orientation: Mid  Staging: Stage 4 - Full thickness tissue loss with exposed bone, tendon or muscle.  Wound Description (Comments):   Present on Admission: Yes   DATA REVIEW:   CBC  No results for input(s): WBC, HGB, HCT, PLT in the last 168 hours.  Chemistries  Recent Labs  Lab 12/06/21 0557  CREATININE 1.52*     Microbiology Results   Recent Results (from the past 240 hour(s))  Culture, blood (Routine X 2) w Reflex to ID Panel     Status: None   Collection Time: 12/02/21  2:42 PM   Specimen: BLOOD  Result Value Ref Range Status   Specimen Description BLOOD LEFT ANTECUBITAL  Final   Special Requests Blood Culture adequate volume  Final   Culture   Final    NO GROWTH 5 DAYS Performed at [facility], [address], [place], [place] [address]    Report Status 12/07/2021 FINAL  Final  Surgical pcr screen     Status: None   Collection Time: 12/04/21  6:39 PM   Specimen: Nasal Mucosa; Nasal Swab  Result Value Ref Range Status   MRSA, PCR NEGATIVE NEGATIVE Final   Staphylococcus aureus NEGATIVE NEGATIVE Final    Comment: (NOTE) The Xpert SA Assay (FDA approved for NASAL specimens in patients 52 years of age and older), is one component of a comprehensive surveillance program. It is not intended to diagnose infection nor to guide or monitor treatment. Performed at [facility], [address], [place], [place] [address]     RADIOLOGY:  No results found.   CODE STATUS:     Code Status Orders  (From admission, onward)           Start     Ordered   12/02/21 1034  Do not attempt resuscitation (DNR)  Continuous       Question Answer Comment  In the event of cardiac or respiratory ARREST Do not call a code  blue   In the event of cardiac or respiratory ARREST Do not perform Intubation, CPR, defibrillation or ACLS   In the event of cardiac or respiratory ARREST Use medication by any route, position, wound care, and other measures to relive pain and suffering. May use oxygen, suction and manual treatment of airway obstruction as needed for comfort.      12/02/21 1033           Code Status History     Date Active Date Inactive Code Status Order ID Comments User Context   07/12/2021 1523 08/03/2021 2026 DNR [id number]  Flora Lipps, MD Inpatient   06/27/2021 0445 07/12/2021 1523 Full Code [id number]  Rise Patience, MD ED        TOTAL TIME TAKING CARE OF THIS PATIENT: 40 minutes.    Fritzi Mandes M.D  [facility]    CC: Primary care physician; [name], PA-C

## 2021-12-12 NOTE — TOC Progression Note (Addendum)
Transition of Care [facility]) - Progression Note    Patient Details  Name: Heather Lopez MRN: [id number] Date of Birth: Apr 03, 1946  [facility]) CM/SW [place], RN Phone Number: 12/12/2021, 8:48 AM  Clinical Narrative:   Reached out to Olivia Mackie at [facility] to inquire when the patient will get the Wound Vac to go home, I spoke with Olivia Mackie and she is working to get the wound vac delivered, I notified her that the patient has [facility] services with [facility]-    Expected Discharge Plan: [place] Barriers to Discharge: Continued Medical Work up  Expected Discharge Plan and Services Expected Discharge Plan: [place] Choice: Resumption of Svcs/PTA Provider Living arrangements for the past 2 months: [place] Expected Discharge Date: 12/11/21               DME Arranged: 3-N-1, Walker rolling, Shower stool, Hospital bed (Purchased RW/3:1/Shower seat.) DME Agency: [employer] (For hospital bed, should be set up Monday am.) Date DME Agency Contacted: 12/09/21 Time DME Agency Contacted: 1243 Representative spoke with at DME Agency: [place]: RN, PT, OT ([place] aide) [place]: [place] ([place]) Date [place]: 12/09/21 Time [place]: 1244 Representative spoke with at [place]: [place] (SDOH) Interventions    Readmission Risk Interventions Readmission Risk Prevention Plan 12/03/2021  Transportation Screening Complete  Medication Review Press photographer) Complete  PCP or Specialist appointment within 3-5 days of discharge Complete  SW Recovery Care/Counseling Consult Complete  Palliative Care Screening Not Applicable  Skilled Nursing Facility Complete  Some recent data might be hidden

## 2021-12-12 NOTE — Progress Notes (Signed)
Patient and spouse given verbal and written discharge instructions, acknowledge understanding and states they will comply. Wound vac teach back instructions was given. Patient is waiting on EMS transport.

## 2021-12-12 NOTE — Progress Notes (Signed)
°   12/12/21 1320  Clinical Encounter Type  Visited With Patient  Visit Type Initial;Spiritual support  Referral From Nurse  Spiritual Encounters  Spiritual Needs Prayer   Patient is ready to check out and pursue rehab and PT at home. Chaplain facilitated meaningful conversation and provide support through compassionate presence and prayer.

## 2021-12-12 NOTE — Progress Notes (Signed)
OT Cancellation Note  Patient Details Name: Heather Lopez MRN: [id number] DOB: May 05, 1946   Cancelled Treatment:     Pt waiting for EMS transport home and all needs met.  [name], MS, OTR/L  Darleene Cleaver 12/12/2021, 2:03 PM

## 2021-12-13 DIAGNOSIS — L89154 Pressure ulcer of sacral region, stage 4: Secondary | ICD-10-CM | POA: Insufficient documentation

## 2021-12-13 DIAGNOSIS — L89151 Pressure ulcer of sacral region, stage 1: Secondary | ICD-10-CM | POA: Insufficient documentation

## 2021-12-19 ENCOUNTER — Inpatient Hospital Stay
Admission: EM | Admit: 2021-12-19 | Discharge: 2021-12-29 | DRG: 682 | Disposition: A | Discharge status: Home Health | Payer: Medicare Other | Attending: Internal Medicine | Admitting: Internal Medicine

## 2021-12-19 ENCOUNTER — Inpatient Hospital Stay: Payer: Medicare Other

## 2021-12-19 ENCOUNTER — Inpatient Hospital Stay: Payer: Medicare Other | Admitting: Infectious Diseases

## 2021-12-19 ENCOUNTER — Inpatient Hospital Stay: Payer: Medicare Other | Attending: Internal Medicine

## 2021-12-19 ENCOUNTER — Other Ambulatory Visit: Payer: Self-pay

## 2021-12-19 ENCOUNTER — Ambulatory Visit: Payer: Medicare Other | Attending: Infectious Diseases | Admitting: Infectious Diseases

## 2021-12-19 VITALS — BP 94/64 | HR 73 | Resp 16 | Ht 63.0 in | Wt 192.3 lb

## 2021-12-19 DIAGNOSIS — M4628 Osteomyelitis of vertebra, sacral and sacrococcygeal region: Secondary | ICD-10-CM

## 2021-12-19 DIAGNOSIS — Z794 Long term (current) use of insulin: Secondary | ICD-10-CM

## 2021-12-19 DIAGNOSIS — E1122 Type 2 diabetes mellitus with diabetic chronic kidney disease: Secondary | ICD-10-CM | POA: Diagnosis present

## 2021-12-19 DIAGNOSIS — N200 Calculus of kidney: Secondary | ICD-10-CM

## 2021-12-19 DIAGNOSIS — Z7982 Long term (current) use of aspirin: Secondary | ICD-10-CM | POA: Diagnosis not present

## 2021-12-19 DIAGNOSIS — F32A Depression, unspecified: Secondary | ICD-10-CM | POA: Diagnosis present

## 2021-12-19 DIAGNOSIS — N136 Pyonephrosis: Secondary | ICD-10-CM | POA: Insufficient documentation

## 2021-12-19 DIAGNOSIS — R5381 Other malaise: Secondary | ICD-10-CM | POA: Diagnosis present

## 2021-12-19 DIAGNOSIS — D500 Iron deficiency anemia secondary to blood loss (chronic): Secondary | ICD-10-CM | POA: Diagnosis present

## 2021-12-19 DIAGNOSIS — E119 Type 2 diabetes mellitus without complications: Secondary | ICD-10-CM

## 2021-12-19 DIAGNOSIS — Z823 Family history of stroke: Secondary | ICD-10-CM | POA: Diagnosis not present

## 2021-12-19 DIAGNOSIS — E872 Acidosis, unspecified: Secondary | ICD-10-CM | POA: Diagnosis present

## 2021-12-19 DIAGNOSIS — Z20822 Contact with and (suspected) exposure to covid-19: Secondary | ICD-10-CM | POA: Diagnosis present

## 2021-12-19 DIAGNOSIS — Z79899 Other long term (current) drug therapy: Secondary | ICD-10-CM | POA: Diagnosis not present

## 2021-12-19 DIAGNOSIS — I10 Essential (primary) hypertension: Secondary | ICD-10-CM | POA: Diagnosis not present

## 2021-12-19 DIAGNOSIS — Z833 Family history of diabetes mellitus: Secondary | ICD-10-CM | POA: Diagnosis not present

## 2021-12-19 DIAGNOSIS — N1832 Chronic kidney disease, stage 3b: Secondary | ICD-10-CM | POA: Diagnosis present

## 2021-12-19 DIAGNOSIS — N189 Chronic kidney disease, unspecified: Secondary | ICD-10-CM

## 2021-12-19 DIAGNOSIS — Z23 Encounter for immunization: Secondary | ICD-10-CM

## 2021-12-19 DIAGNOSIS — Z6833 Body mass index (BMI) 33.0-33.9, adult: Secondary | ICD-10-CM

## 2021-12-19 DIAGNOSIS — D649 Anemia, unspecified: Secondary | ICD-10-CM

## 2021-12-19 DIAGNOSIS — Z960 Presence of urogenital implants: Secondary | ICD-10-CM

## 2021-12-19 DIAGNOSIS — N179 Acute kidney failure, unspecified: Secondary | ICD-10-CM | POA: Diagnosis present

## 2021-12-19 DIAGNOSIS — F3289 Other specified depressive episodes: Secondary | ICD-10-CM | POA: Diagnosis not present

## 2021-12-19 DIAGNOSIS — E785 Hyperlipidemia, unspecified: Secondary | ICD-10-CM | POA: Diagnosis present

## 2021-12-19 DIAGNOSIS — L89154 Pressure ulcer of sacral region, stage 4: Secondary | ICD-10-CM | POA: Insufficient documentation

## 2021-12-19 DIAGNOSIS — N184 Chronic kidney disease, stage 4 (severe): Secondary | ICD-10-CM | POA: Diagnosis present

## 2021-12-19 DIAGNOSIS — Z87891 Personal history of nicotine dependence: Secondary | ICD-10-CM | POA: Diagnosis not present

## 2021-12-19 DIAGNOSIS — K5900 Constipation, unspecified: Secondary | ICD-10-CM | POA: Diagnosis present

## 2021-12-19 DIAGNOSIS — D631 Anemia in chronic kidney disease: Secondary | ICD-10-CM | POA: Insufficient documentation

## 2021-12-19 DIAGNOSIS — I4892 Unspecified atrial flutter: Secondary | ICD-10-CM | POA: Diagnosis not present

## 2021-12-19 DIAGNOSIS — E669 Obesity, unspecified: Secondary | ICD-10-CM | POA: Diagnosis present

## 2021-12-19 DIAGNOSIS — N132 Hydronephrosis with renal and ureteral calculous obstruction: Secondary | ICD-10-CM | POA: Diagnosis present

## 2021-12-19 DIAGNOSIS — I129 Hypertensive chronic kidney disease with stage 1 through stage 4 chronic kidney disease, or unspecified chronic kidney disease: Secondary | ICD-10-CM | POA: Diagnosis present

## 2021-12-19 DIAGNOSIS — Z801 Family history of malignant neoplasm of trachea, bronchus and lung: Secondary | ICD-10-CM

## 2021-12-19 DIAGNOSIS — L89159 Pressure ulcer of sacral region, unspecified stage: Secondary | ICD-10-CM | POA: Diagnosis not present

## 2021-12-19 DIAGNOSIS — R0600 Dyspnea, unspecified: Secondary | ICD-10-CM

## 2021-12-19 DIAGNOSIS — R300 Dysuria: Secondary | ICD-10-CM | POA: Diagnosis not present

## 2021-12-19 DIAGNOSIS — Z66 Do not resuscitate: Secondary | ICD-10-CM | POA: Diagnosis present

## 2021-12-19 DIAGNOSIS — N390 Urinary tract infection, site not specified: Secondary | ICD-10-CM | POA: Diagnosis not present

## 2021-12-19 LAB — CBC WITH DIFFERENTIAL/PLATELET
Abs Immature Granulocytes: 0.05 10*3/uL (ref 0.00–0.07)
Basophils Absolute: 0.1 10*3/uL (ref 0.0–0.1)
Basophils Relative: 1 %
Eosinophils Absolute: 0.2 10*3/uL (ref 0.0–0.5)
Eosinophils Relative: 2 %
HCT: 31.7 % — ABNORMAL LOW (ref 36.0–46.0)
Hemoglobin: 9.7 g/dL — ABNORMAL LOW (ref 12.0–15.0)
Immature Granulocytes: 1 %
Lymphocytes Relative: 17 %
Lymphs Abs: 1.7 10*3/uL (ref 0.7–4.0)
MCH: 25.3 pg — ABNORMAL LOW (ref 26.0–34.0)
MCHC: 30.6 g/dL (ref 30.0–36.0)
MCV: 82.6 fL (ref 80.0–100.0)
Monocytes Absolute: 0.9 10*3/uL (ref 0.1–1.0)
Monocytes Relative: 9 %
Neutro Abs: 7 10*3/uL (ref 1.7–7.7)
Neutrophils Relative %: 70 %
Platelets: 376 10*3/uL (ref 150–400)
RBC: 3.84 MIL/uL — ABNORMAL LOW (ref 3.87–5.11)
RDW: 17.5 % — ABNORMAL HIGH (ref 11.5–15.5)
WBC: 9.8 10*3/uL (ref 4.0–10.5)
nRBC: 0 % (ref 0.0–0.2)

## 2021-12-19 LAB — HEMOGLOBIN AND HEMATOCRIT, BLOOD
HCT: 34.7 % — ABNORMAL LOW (ref 36.0–46.0)
Hemoglobin: 10.5 g/dL — ABNORMAL LOW (ref 12.0–15.0)

## 2021-12-19 LAB — COMPREHENSIVE METABOLIC PANEL
ALT: 25 U/L (ref 0–44)
AST: 20 U/L (ref 15–41)
Albumin: 2.8 g/dL — ABNORMAL LOW (ref 3.5–5.0)
Alkaline Phosphatase: 106 U/L (ref 38–126)
Anion gap: 11 (ref 5–15)
BUN: 28 mg/dL — ABNORMAL HIGH (ref 8–23)
CO2: 21 mmol/L — ABNORMAL LOW (ref 22–32)
Calcium: 9.1 mg/dL (ref 8.9–10.3)
Chloride: 105 mmol/L (ref 98–111)
Creatinine, Ser: 2.26 mg/dL — ABNORMAL HIGH (ref 0.44–1.00)
GFR, Estimated: 22 mL/min — ABNORMAL LOW (ref >60)
Glucose, Bld: 70 mg/dL (ref 70–99)
Potassium: 3.9 mmol/L (ref 3.5–5.1)
Sodium: 137 mmol/L (ref 135–145)
Total Bilirubin: 0.3 mg/dL (ref 0.3–1.2)
Total Protein: 6.4 g/dL — ABNORMAL LOW (ref 6.5–8.1)

## 2021-12-19 LAB — BASIC METABOLIC PANEL
Anion gap: 8 (ref 5–15)
BUN: 28 mg/dL — ABNORMAL HIGH (ref 8–23)
CO2: 22 mmol/L (ref 22–32)
Calcium: 9 mg/dL (ref 8.9–10.3)
Chloride: 107 mmol/L (ref 98–111)
Creatinine, Ser: 2.35 mg/dL — ABNORMAL HIGH (ref 0.44–1.00)
GFR, Estimated: 21 mL/min — ABNORMAL LOW (ref >60)
Glucose, Bld: 70 mg/dL (ref 70–99)
Potassium: 3.6 mmol/L (ref 3.5–5.1)
Sodium: 137 mmol/L (ref 135–145)

## 2021-12-19 LAB — RESP PANEL BY RT-PCR (FLU A&B, COVID) ARPGX2
Influenza A by PCR: NEGATIVE
Influenza B by PCR: NEGATIVE
SARS Coronavirus 2 by RT PCR: NEGATIVE

## 2021-12-19 LAB — CBG MONITORING, ED: Glucose-Capillary: 95 mg/dL (ref 70–99)

## 2021-12-19 LAB — LACTIC ACID, PLASMA
Lactic Acid, Venous: 2 mmol/L (ref 0.5–1.9)
Lactic Acid, Venous: 0.9 mmol/L (ref 0.5–1.9)

## 2021-12-19 MED ORDER — OSMOLITE 1.2 CAL PO LIQD: 1000.0000 mL | ORAL | Status: DC

## 2021-12-19 MED ORDER — ONDANSETRON HCL 4 MG PO TABS: 4.0000 mg | ORAL_TABLET | Freq: Four times a day (QID) | ORAL | Status: DC | PRN

## 2021-12-19 MED ORDER — ACETAMINOPHEN 325 MG PO TABS: 650.0000 mg | ORAL_TABLET | Freq: Four times a day (QID) | ORAL | Status: DC | PRN

## 2021-12-19 MED ORDER — SODIUM CHLORIDE 0.9 % IV BOLUS
1000.0000 mL | Freq: Once | INTRAVENOUS | Status: AC
  Administered 2021-12-19: 21:00:00 1000 mL via INTRAVENOUS

## 2021-12-19 MED ORDER — ACETAMINOPHEN 650 MG RE SUPP: 650.0000 mg | Freq: Four times a day (QID) | RECTAL | Status: DC | PRN

## 2021-12-19 MED ORDER — ONDANSETRON HCL 4 MG/2ML IJ SOLN: 4.0000 mg | Freq: Four times a day (QID) | INTRAMUSCULAR | Status: DC | PRN

## 2021-12-19 MED ORDER — INSULIN ASPART 100 UNIT/ML IJ SOLN
0.0000 [IU] | Freq: Every day | INTRAMUSCULAR | Status: DC
  Administered 2021-12-22 – 2021-12-24 (×2): 2 [IU] via SUBCUTANEOUS
  Administered 2021-12-25: 3 [IU] via SUBCUTANEOUS
  Administered 2021-12-26: 2 [IU] via SUBCUTANEOUS
  Administered 2021-12-27: 3 [IU] via SUBCUTANEOUS
  Filled 2021-12-19 (×4): qty 1

## 2021-12-19 MED ORDER — INSULIN ASPART 100 UNIT/ML IJ SOLN
0.0000 [IU] | Freq: Three times a day (TID) | INTRAMUSCULAR | Status: DC
  Administered 2021-12-20 – 2021-12-21 (×3): 2 [IU] via SUBCUTANEOUS
  Administered 2021-12-21: 5 [IU] via SUBCUTANEOUS
  Administered 2021-12-21 – 2021-12-23 (×6): 3 [IU] via SUBCUTANEOUS
  Administered 2021-12-24: 5 [IU] via SUBCUTANEOUS
  Administered 2021-12-24: 3 [IU] via SUBCUTANEOUS
  Administered 2021-12-24 – 2021-12-25 (×2): 5 [IU] via SUBCUTANEOUS
  Administered 2021-12-25: 3 [IU] via SUBCUTANEOUS
  Administered 2021-12-25: 5 [IU] via SUBCUTANEOUS
  Administered 2021-12-26: 8 [IU] via SUBCUTANEOUS
  Administered 2021-12-26 – 2021-12-27 (×2): 3 [IU] via SUBCUTANEOUS
  Administered 2021-12-27: 5 [IU] via SUBCUTANEOUS
  Administered 2021-12-27: 3 [IU] via SUBCUTANEOUS
  Administered 2021-12-28: 5 [IU] via SUBCUTANEOUS
  Administered 2021-12-28 – 2021-12-29 (×3): 3 [IU] via SUBCUTANEOUS
  Administered 2021-12-29: 5 [IU] via SUBCUTANEOUS
  Filled 2021-12-19 (×26): qty 1

## 2021-12-19 MED ORDER — SODIUM CHLORIDE 0.9 % IV SOLN: INTRAVENOUS | Status: AC

## 2021-12-19 MED ORDER — ENOXAPARIN SODIUM 40 MG/0.4ML IJ SOSY
40.0000 mg | PREFILLED_SYRINGE | INTRAMUSCULAR | Status: DC
  Administered 2021-12-20 – 2021-12-28 (×9): 40 mg via SUBCUTANEOUS
  Filled 2021-12-19 (×9): qty 0.4

## 2021-12-19 MED ORDER — SODIUM CHLORIDE 0.9 % IV BOLUS
1000.0000 mL | Freq: Once | INTRAVENOUS | Status: AC
  Administered 2021-12-19: 19:00:00 1000 mL via INTRAVENOUS

## 2021-12-19 MED ORDER — HYDROCODONE-ACETAMINOPHEN 5-325 MG PO TABS
1.0000 | ORAL_TABLET | ORAL | Status: DC | PRN
  Administered 2021-12-20 (×2): 2 via ORAL
  Administered 2021-12-20: 1 via ORAL
  Administered 2021-12-21 – 2021-12-27 (×8): 2 via ORAL
  Administered 2021-12-28 (×2): 1 via ORAL
  Administered 2021-12-29: 2 via ORAL
  Filled 2021-12-19: qty 2
  Filled 2021-12-19: qty 1
  Filled 2021-12-19 (×7): qty 2
  Filled 2021-12-19: qty 1
  Filled 2021-12-19 (×3): qty 2
  Filled 2021-12-19: qty 1

## 2021-12-19 MED ORDER — MORPHINE SULFATE (PF) 2 MG/ML IV SOLN
2.0000 mg | INTRAVENOUS | Status: DC | PRN
  Administered 2021-12-25 – 2021-12-26 (×2): 2 mg via INTRAVENOUS
  Filled 2021-12-19 (×2): qty 1

## 2021-12-19 NOTE — ED Notes (Signed)
Lab called for blood draw.

## 2021-12-19 NOTE — Patient Instructions (Addendum)
You are here for follow up for the complicated UTI which ahs been treated- you had stones lasrered and stents exchanged- you finshed antibiotics yesterday- please follow up with urology- you dont need any more antibiotics unless they are going to do a procedure- in that case they will check your urine or empirically give antibiotic- You need to follow up with Dr.Sakai surgeon at [facility] clinic  [phone]   [address]   [place] [address]

## 2021-12-19 NOTE — H&P (Signed)
History and Physical    Heather Lopez XVQ:[id number] DOB: 1946-11-20 DOA: 12/19/2021  PCP: [name], PA-C   Patient coming from: home  I have personally briefly reviewed patient's relevant medical records in [place]  Chief Complaint: sacral pain  HPI: Heather Lopez is a 76 y.o. 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  She also complains of weakness since her recent discharge, dizziness and feeling like she is about to pass out.  She denies chest pain or palpitations, headaches or one-sided numbness tingling or weakness.  Denies visual disturbance.  Denies nausea, vomiting, abdominal pain or diarrhea  ED course: Afebrile, with soft BP as low as 103/48 in the ED with otherwise normal vitals Blood work: WBC 9.8 with lactic acid 2-0 0.9 Creatinine 2.35, up from 1.34 on 1/10 Hemoglobin 9.7 down from 10.5 on 1/10 Urinalysis pending COVID and influenza PCR pending  EKG: Not done from the ED.  Currently pending  Patient was hydrated in the ED with improvement in BP, normalization of lactate but with increase in creatinine on repeat.  Due to persistent generalized weakness, AKI, observation requested.    Review of Systems: As per HPI otherwise all other systems on review of systems negative.   Assessment/Plan    Acute kidney injury superimposed on CKD (HCC)   Bilateral nephrolithiasis s/p lithotripsy and ureteral stent placement on 12/09/2021 -Suspecting prerenal due to fluid losses, possibly from wound VAC - IV hydration - Consider CT abdomen and pelvis to evaluate  for obstruction if creatinine not improving    Acute on chronic anemia - Suspect due to chronic blood loss from wound VAC - Serial H&H and transfuse if necessary - Continue ferrous sulfate.  Patient used to get IV iron in the past - Continue to monitor    Lactic acidosis ?  Sepsis   Bilateral nephrolithiasis   Status post placement of ureteral stent 12/09/2021 - Lactic acidosis resolved with IV hydration in the ED - Follow urinalysis to evaluate for possible infection - Continue IV fluids - Sepsis not suspected but will continue to monitor for sepsis criteria    Painful decubitus ulcer of sacral region, stage 4 (HCC)   Chronic osteomyelitis of coccyx ([place]) - Continue wound VAC - Wound care consult    Depression - Continue home meds    Diabetes mellitus, type II (HCC) - Sliding scale insulin and basal insulin    Essential hypertension - Continue home regimen of metoprolol and clonidine   DVT prophylaxis: Lovenox  Code Status: DNR Family Communication:  none  Disposition Plan: Back to previous home environment Consults called: none  Status:At the time of admission, it appears that the appropriate admission status for this patient is INPATIENT. This is judged to be reasonable and necessary in order to provide the required intensity of service to ensure the patient's safety given the presenting symptoms, physical exam findings, and initial radiographic and laboratory data in the context of their  Comorbid conditions.   Patient requires inpatient status due to high intensity of service, high risk for further deterioration and high frequency of surveillance required.   I certify that at the  point of admission it is my clinical judgment that the patient will require inpatient hospital care spanning beyond 2 midnights     Physical Exam: Vitals:   12/19/21 1730 12/19/21 1900 12/19/21 1930 12/19/21 2000  BP: (!) 138/46 (!) 103/48 (!) 118/43 (!) 118/53  Pulse: 60 70 (!) 59 60   Resp: 17     Temp: 98 F (36.7 C)     TempSrc: Oral     SpO2: 96% 96% 98% 94%  Weight:      Height:       Constitutional: Alert, oriented x 3 . Not in any apparent distress HEENT:      Head: Normocephalic and atraumatic.         Eyes: PERLA, EOMI, Conjunctivae are normal. Sclera is non-icteric.       Mouth/Throat: Mucous membranes are moist.       Neck: Supple with no signs of meningismus. Cardiovascular: Regular rate and rhythm. No murmurs, gallops, or rubs. 2+ symmetrical distal pulses are present . No JVD. No  LE edema Respiratory: Respiratory effort normal .Lungs sounds clear bilaterally. No wheezes, crackles, or rhonchi.  Gastrointestinal: Soft, non tender, non distended. Positive bowel sounds.  Genitourinary: No CVA tenderness. Musculoskeletal: Nontender with normal range of motion in all extremities. No cyanosis, or erythema of extremities. Neurologic:  Face is symmetric. Moving all extremities. No gross focal neurologic deficits . Skin: Skin is warm, dry.  No rash or ulcers Psychiatric: Mood and affect are appropriate     Past Medical History:  Diagnosis Date   Anxiety    Cataract    Depression    Diabetes mellitus without complication ([place])    Gout    History of kidney stones    Hyperlipidemia    Hypertension    Vertigo     Past Surgical History:  Procedure Laterality Date   CATARACT EXTRACTION, BILATERAL Bilateral 2019   COLONOSCOPY WITH PROPOFOL N/A 08/19/2015   Procedure: COLONOSCOPY WITH PROPOFOL;  Surgeon: Lucilla Lame, MD;  Location: [place];  Service: Endoscopy;  Laterality: N/A;  diabetic - insulin   CYSTOSCOPY W/ RETROGRADES Bilateral 12/22/2019   Procedure: CYSTOSCOPY WITH RETROGRADE PYELOGRAM;  Surgeon: Abbie Sons, MD;  Location: [facility];  Service: Urology;  Laterality: Bilateral;   CYSTOSCOPY W/ URETERAL STENT PLACEMENT Bilateral 07/05/2021   Procedure: CYSTOSCOPY WITH RETROGRADE PYELOGRAM/BILATERAL URETERAL STENT PLACEMENT;   Surgeon: Abbie Sons, MD;  Location: [facility];  Service: Urology;  Laterality: Bilateral;   CYSTOSCOPY WITH BIOPSY N/A 12/22/2019   Procedure: CYSTOSCOPY WITH bladder BIOPSY;  Surgeon: Abbie Sons, MD;  Location: [facility];  Service: Urology;  Laterality: N/A;   CYSTOSCOPY WITH STENT PLACEMENT Right 12/22/2019   Procedure: CYSTOSCOPY WITH STENT PLACEMENT;  Surgeon: Abbie Sons, MD;  Location: [facility];  Service: Urology;  Laterality: Right;   CYSTOSCOPY WITH URETEROSCOPY Bilateral 12/22/2019   Procedure: CYSTOSCOPY WITH URETEROSCOPY;  Surgeon: Abbie Sons, MD;  Location: [facility];  Service: Urology;  Laterality: Bilateral;   CYSTOSCOPY/URETEROSCOPY/HOLMIUM LASER/STENT PLACEMENT Right 12/22/2019   Procedure: CYSTOSCOPY/URETEROSCOPY/HOLMIUM LASER/STENT PLACEMENT;  Surgeon: Abbie Sons, MD;  Location: [facility];  Service: Urology;  Laterality: Right;   CYSTOSCOPY/URETEROSCOPY/HOLMIUM LASER/STENT PLACEMENT Bilateral 12/08/2021   Procedure: CYSTOSCOPY/URETEROSCOPY/HOLMIUM LASER/STENT PLACEMENT;  Surgeon: [name], MD;  Location: [facility];  Service: Urology;  Laterality: Bilateral;   DIALYSIS/PERMA CATHETER INSERTION N/A 06/29/2021   Procedure: DIALYSIS/PERMA CATHETER INSERTION;  Surgeon: Algernon Huxley, MD;  Location: [place] [facility];  Service: Cardiovascular;  Laterality: N/A;   DIALYSIS/PERMA CATHETER INSERTION N/A 07/13/2021   Procedure: DIALYSIS/PERMA CATHETER INSERTION;  Surgeon: Algernon Huxley, MD;  Location: [place] [facility];  Service: Cardiovascular;  Laterality: N/A;   EYE SURGERY     IR GASTROSTOMY TUBE MOD SED  08/01/2021   LAPAROTOMY N/A 06/29/2021   Procedure: EXPLORATORY LAPAROTOMY WITH REPAIR OF DUODENAL PERFORATION;  Surgeon: [name], MD;  Location: [facility];  Service: General;  Laterality: N/A;   POLYPECTOMY  08/19/2015   Procedure: POLYPECTOMY;  Surgeon: Lucilla Lame, MD;  Location: [facility];  Service: Endoscopy;;   TUBAL LIGATION  1992      reports that she quit smoking about 50 years ago. Her smoking use included cigarettes. She has a 4.50 pack-year smoking history. She has never used smokeless tobacco. She reports that she does not drink alcohol and does not use drugs.  Allergies  Allergen Reactions   Contrast Media  [Iodinated Contrast Media] Anaphylaxis   Iodine Swelling    (IV only) - angioedema    Family History  Problem Relation Age of Onset   Diabetes Father    Cancer Father        lung cancer   Diabetes Brother    Stroke Mother       Prior to Admission medications   Medication Sig Start Date End Date Taking? Authorizing Provider  aspirin 81 MG EC tablet Take by mouth.    [provider]  atorvastatin (LIPITOR) 20 MG tablet Take 1 tablet (20 mg total) by mouth daily. Patient taking differently: Take 10 mg by mouth at bedtime. 04/15/18   Mikey College, NP  buPROPion (WELLBUTRIN) 100 MG tablet Place 100 mg into feeding tube daily.    [provider]  busPIRone (BUSPAR) 5 MG tablet Place 5 mg into feeding tube daily.    [provider]  cloNIDine (CATAPRES) 0.1 MG tablet Take 1 tablet (0.1 mg total) by mouth 2 (two) times daily. 12/11/21   Fritzi Mandes, MD  Exenatide ER (BYDUREON BCISE) 2 MG/0.85ML AUIJ     [provider]  fluticasone (FLONASE) 50 MCG/ACT nasal spray Place 1 spray into both nostrils daily.    [provider]  insulin detemir (LEVEMIR) 100 UNIT/ML injection Inject 0.3 mLs (30 Units total) into the skin 2 (two) times daily. 08/03/21   Sharen Hones, MD  insulin regular (NOVOLIN R) 100 units/mL injection Inject 0-10 Units into the skin 4 (four) times daily -  before meals and at bedtime. (Based upon sliding scale)    [provider]  iron polysaccharides (NIFEREX) 150 MG capsule Take 1 capsule (150 mg total) by mouth daily. 12/11/21   Fritzi Mandes, MD  lansoprazole (PREVACID) 30 MG capsule Take 30 mg by mouth at bedtime.    [provider]   loperamide (IMODIUM A-D) 2 MG tablet Take 2 mg by mouth every 6 (six) hours as needed for diarrhea or loose stools.    [provider]  metoprolol tartrate (LOPRESSOR) 100 MG tablet Take 1 tablet (100 mg total) by mouth 2 (two) times daily. 12/11/21   Fritzi Mandes, MD  Multiple Vitamin (MULTIVITAMIN WITH MINERALS) TABS tablet Take 1 tablet by mouth daily. 12/12/21   Fritzi Mandes, MD  nystatin (MYCOSTATIN/NYSTOP) powder Apply 1 application topically 2 (two) times daily for 13 days. (Apply under the breasts) 12/11/21 12/24/21  Fritzi Mandes, MD  oxyCODONE-acetaminophen (PERCOCET/ROXICET) 5-325 MG tablet Take 1 tablet by mouth every 6 (six) hours as needed for moderate  pain or severe pain. 12/11/21   Fritzi Mandes, MD  polyethylene glycol (MIRALAX / GLYCOLAX) 17 g packet Take 17 g by mouth daily.    [provider]  senna-docusate (SENOKOT-S) 8.6-50 MG tablet Place 1 tablet into feeding tube 2 (two) times daily.    [provider]  sitaGLIPtin (JANUVIA) 25 MG tablet Place 1 tablet (25 mg total) into feeding tube daily. 12/11/21   Fritzi Mandes, MD      Labs on Admission: I have personally reviewed following labs and imaging studies  CBC: Recent Labs  Lab 12/19/21 1238 12/19/21 1411  WBC  --  9.8  NEUTROABS  --  7.0  HGB 10.5* 9.7*  HCT 34.7* 31.7*  MCV  --  82.6  PLT  --  916   Basic Metabolic Panel: Recent Labs  Lab 12/19/21 1411 12/19/21 2010  NA 137 137  K 3.9 3.6  CL 105 107  CO2 21* 22  GLUCOSE 70 70  BUN 28* 28*  CREATININE 2.26* 2.35*  CALCIUM 9.1 9.0   GFR: Estimated Creatinine Clearance: 22 mL/min (A) (by C-G formula based on SCr of 2.35 mg/dL (H)). Liver Function Tests: Recent Labs  Lab 12/19/21 1411  AST 20  ALT 25  ALKPHOS 106  BILITOT 0.3  PROT 6.4*  ALBUMIN 2.8*   No results for input(s): LIPASE, AMYLASE in the last 168 hours. No results for input(s): AMMONIA in the last 168 hours. Coagulation Profile: No results for input(s): INR,  PROTIME in the last 168 hours. Cardiac Enzymes: No results for input(s): CKTOTAL, CKMB, CKMBINDEX, TROPONINI in the last 168 hours. BNP (last 3 results) No results for input(s): PROBNP in the last 8760 hours. HbA1C: No results for input(s): HGBA1C in the last 72 hours. CBG: No results for input(s): GLUCAP in the last 168 hours. Lipid Profile: No results for input(s): CHOL, HDL, LDLCALC, TRIG, CHOLHDL, LDLDIRECT in the last 72 hours. Thyroid Function Tests: No results for input(s): TSH, T4TOTAL, FREET4, T3FREE, THYROIDAB in the last 72 hours. Anemia Panel: No results for input(s): VITAMINB12, FOLATE, FERRITIN, TIBC, IRON, RETICCTPCT in the last 72 hours. Urine analysis:    Component Value Date/Time   COLORURINE YELLOW (A) 12/02/2021 0609   APPEARANCEUR TURBID (A) 12/02/2021 0609   APPEARANCEUR Cloudy (A) 12/30/2019 1439   LABSPEC 1.010 12/02/2021 0609   LABSPEC 1.024 01/27/2014 1350   PHURINE 5.0 12/02/2021 0609   GLUCOSEU NEGATIVE 12/02/2021 0609   GLUCOSEU >=500 01/27/2014 1350   HGBUR MODERATE (A) 12/02/2021 0609   BILIRUBINUR NEGATIVE 12/02/2021 0609   BILIRUBINUR Negative 12/30/2019 1439   BILIRUBINUR Negative 01/27/2014 1350   KETONESUR NEGATIVE 12/02/2021 0609   PROTEINUR 100 (A) 12/02/2021 0609   UROBILINOGEN 0.2 12/17/2017 1524   NITRITE NEGATIVE 12/02/2021 0609   LEUKOCYTESUR MODERATE (A) 12/02/2021 0609   LEUKOCYTESUR 2+ 01/27/2014 1350    Radiological Exams on Admission: No results found.     Athena Masse MD [facility]   12/19/2021, 10:19 PM

## 2021-12-19 NOTE — ED Notes (Signed)
Pt has a lactic acid  of 2.0

## 2021-12-19 NOTE — ED Provider Notes (Signed)
[facility] Provider Note    Event Date/Time   First MD Initiated Contact with Patient 12/19/21 1635     (approximate)   History   Wound Infection   HPI  Heather Lopez is a 76 y.o. female with history of diabetes, hypertension, hyperlipidemia, gout, and a stage III sacral ulcer who presents with increased pain to the sacral wound today.  The patient has a wound VAC in place and states that the area is always painful when she has to sit on it for more than 30 minutes.  Today she needed to sit for over 2 hours as she was attending multiple medical appointments.  The pain then became unbearable.  She takes oxycodone at home which has provided some relief.  Now that she has been waiting in the ED for several hours and has been able to be off of the wound, the pain is resolved.  She denies any fever or chills, bleeding or drainage from the wound, wound VAC malfunctions, or any other acute symptoms.  She does state that she has not been eating and drinking well in the last several weeks.    Physical Exam   Triage Vital Signs: ED Triage Vitals  Enc Vitals Group     BP 12/19/21 1337 117/81     Pulse Rate 12/19/21 1336 75     Resp 12/19/21 1336 18     Temp 12/19/21 1336 97.7 F (36.5 C)     Temp Source 12/19/21 1336 Oral     SpO2 12/19/21 1336 97 %     Weight 12/19/21 1335 192 lb 4.8 oz (87.2 kg)     Height 12/19/21 1335 5\' 3"  (1.6 m)     Head Circumference --      [facility] --      Pain Score 12/19/21 1335 10     Pain Loc --      Pain Edu? --      Excl. in [place]? --     Most recent vital signs: Vitals:   12/19/21 2200 12/19/21 2230  BP: (!) 134/45 (!) 123/45  Pulse: (!) 55 (!) 59  Resp:  19  Temp:  98 F (36.7 C)  SpO2: 97% 99%     General: Awake, no distress.  CV:  Good peripheral perfusion.  Resp:  Normal effort.  Abd:  No distention.  Other:  Right-sided sacral ulcer with foam and wound VAC in place.  No surrounding erythema, induration,  abnormal warmth.  No purulence or drainage.  No fluctuance.  Nontender.  ED Results / Procedures / Treatments   Labs (all labs ordered are listed, but only abnormal results are displayed) Labs Reviewed  LACTIC ACID, PLASMA - Abnormal; Notable for the following components:      Result Value   Lactic Acid, Venous 2.0 (*)    All other components within normal limits  COMPREHENSIVE METABOLIC PANEL - Abnormal; Notable for the following components:   CO2 21 (*)    BUN 28 (*)    Creatinine, Ser 2.26 (*)    Total Protein 6.4 (*)    Albumin 2.8 (*)    GFR, Estimated 22 (*)    All other components within normal limits  CBC WITH DIFFERENTIAL/PLATELET - Abnormal; Notable for the following components:   RBC 3.84 (*)    Hemoglobin 9.7 (*)    HCT 31.7 (*)    MCH 25.3 (*)    RDW 17.5 (*)    All other components  within normal limits  BASIC METABOLIC PANEL - Abnormal; Notable for the following components:   BUN 28 (*)    Creatinine, Ser 2.35 (*)    GFR, Estimated 21 (*)    All other components within normal limits  RESP PANEL BY RT-PCR (FLU A&B, COVID) [id number]  LACTIC ACID, PLASMA  URINALYSIS, ROUTINE W REFLEX MICROSCOPIC  CBC  BASIC METABOLIC PANEL  CBC  CBG MONITORING, ED     EKG     RADIOLOGY   PROCEDURES:  Critical Care performed: No  Procedures   MEDICATIONS ORDERED IN ED: Medications  feeding supplement (OSMOLITE 1.2 CAL) liquid 1,000 mL (1,000 mLs Per Tube Not Given 12/19/21 2232)  enoxaparin (LOVENOX) injection 40 mg (has no administration in time range)  insulin aspart (novoLOG) injection 0-15 Units (has no administration in time range)  insulin aspart (novoLOG) injection 0-5 Units (has no administration in time range)  0.9 %  sodium chloride infusion (has no administration in time range)  acetaminophen (TYLENOL) tablet 650 mg (has no administration in time range)    Or  acetaminophen (TYLENOL) suppository 650 mg (has no administration in time range)   HYDROcodone-acetaminophen (NORCO/VICODIN) 5-325 MG per tablet 1-2 tablet (has no administration in time range)  morphine 2 MG/ML injection 2 mg (has no administration in time range)  ondansetron (ZOFRAN) tablet 4 mg (has no administration in time range)    Or  ondansetron (ZOFRAN) injection 4 mg (has no administration in time range)  sodium chloride 0.9 % bolus 1,000 mL (0 mLs Intravenous Stopped 12/19/21 1958)  sodium chloride 0.9 % bolus 1,000 mL (0 mLs Intravenous Stopped 12/19/21 2234)     IMPRESSION / MDM / ASSESSMENT AND PLAN / ED COURSE  I reviewed the triage vital signs and the nursing notes.  76 year old female with PMH as noted above presents with pain to a right-sided sacral wound which has a wound VAC in place after she had to sit for several hours today.  The pain has now resolved.  She denies any other acute complaints.  I reviewed the past medical records; the patient was admitted earlier this month.  Discharge summary on 1/17 in the case that she was treated for severe sepsis due to complicated UTI with AKI and metabolic encephalopathy.  There was no acute wound infection or complication during that admission.  On exam the patient is overall comfortable appearing.  Her vital signs are normal.  The wound VAC is in place and I did not remove it since it was recently secured.  There is no surrounding erythema, induration, abnormal warmth, or any drainage.  Overall presentation is consistent with pain due to direct pressure on the wound.  Now that the patient has been able to lie down on her side and be off of the wound the pain is resolved.  There is no evidence of wound infection, cellulitis, abscess, progression of the wound, or other acute complication.  Lab work-up was obtained from triage and demonstrates no leukocytosis or other signs of infection.  However, the creatinine is slightly elevated from baseline and the patient's lactate is minimally elevated.  There is no clinical  evidence for sepsis.  The patient does admit that she has been eating and drinking relatively poorly over the last several weeks since leaving the hospital.  I suspect that these findings are due to dehydration/hypovolemia.  We will give fluids and recheck labs.  I anticipate discharge home if these values improved.  The patient agrees with this plan.  -----------------------------------------  11:07 PM on 12/19/2021 -----------------------------------------  The creatinine actually slightly worsened after fluids.  The lactate did clear.  The patient states she feels weak and based on discussion with her we will admit for further IV hydration and work-up of AKI.  I consulted Dr. Damita Dunnings from the hospitalist service; based on our discussion she agrees to admit the patient.    FINAL CLINICAL IMPRESSION(S) / ED DIAGNOSES   Final diagnoses:  AKI (acute kidney injury) ([place])     Rx / DC Orders   ED Discharge Orders     None        Note:  This document was prepared using Dragon voice recognition software and may include unintentional dictation errors.    Arta Silence, MD 12/19/21 2308

## 2021-12-19 NOTE — Progress Notes (Signed)
PHARMACIST - PHYSICIAN COMMUNICATION  CONCERNING:  Enoxaparin (Lovenox) for DVT Prophylaxis    RECOMMENDATION: Patient was prescribed enoxaprin 40mg  q24 hours for VTE prophylaxis.   Filed Weights   12/19/21 1335  Weight: 87.2 kg (192 lb 4.8 oz)    Body mass index is 34.06 kg/m.  Estimated Creatinine Clearance: 22 mL/min (A) (by C-G formula based on SCr of 2.35 mg/dL (H)).   Based on [place] patient is candidate for enoxaparin 40 mg SQ every 24 hours based on BMI being >30.  DESCRIPTION: Pharmacy has adjusted enoxaparin dose per [facility] policy.  Patient is now receiving enoxaparin 40 mg every 24 hours    Renda Rolls, PharmD, [facility] 12/19/2021 10:39 PM

## 2021-12-19 NOTE — ED Provider Triage Note (Signed)
Emergency Medicine Provider Triage Evaluation Note  Heather Lopez, a 76 y.o. female  was evaluated in triage.  Pt complains of presents to the ED from home for evaluation of pain to her chronic sacral wound. She was released from [facility] last week. She has weekly home health RN visits for wound care. She presents with increased pain and weakness. Patient dose a Hydrocodone tablet from home in triage.   Review of Systems  Positive: Sacral pain Negative: FCS  Physical Exam  BP 117/81    Pulse 75    Temp 97.7 F (36.5 C) (Oral)    Resp 18    Ht 5\' 3"  (1.6 m)    Wt 87.2 kg    SpO2 97%    BMI 34.06 kg/m  Gen:   Awake, no distress   Resp:  Normal effort  MSK:   Moves extremities without difficulty  Other:  CVS: RRR  Medical Decision Making  Medically screening exam initiated at 2:03 PM.  Appropriate orders placed.  [name] was informed that the remainder of the evaluation will be completed by another provider, this initial triage assessment does not replace that evaluation, and the importance of remaining in the ED until their evaluation is complete.  Patient from home with ED evaluation of increased pain from her sacral wound.    Melvenia Needles, PA-C 12/19/21 1407

## 2021-12-19 NOTE — Progress Notes (Signed)
NAME: Heather Lopez  DOB: [birth year]  MRN: [id number]  Date/Time: 12/19/2021 10:09 PM   Subjective:   Heather Lopez is a 76 y.o. with a history of  T2DM, Atrial flutter, prolonged hospitalization in Aug/sep for septic shock, duodenal perforation, s/p surgery omental patch,  Left PUJ stone, let renal abscess due to candida glabarata  Patient is here with her husband following recent hospital discharge She was in the hospital between 12/02/2021 until 12/12/2021 brought in from skilled nursing facility with abdominal pain and fever.  She was diagnosed with bilateral hydronephrosis and hydroureter right more than left in spite of double-J ureteral stents with adequate positioning.  The urinary bladder was also dilated distended due to urinary retention which was causing the bilateral hydronephrosis..  Patient had Foley placed.  Proteus was growing in the urine culture.  She was put on IV ampicillin. She was then seen by urologist on 12/09/2021 and underwent laser lithotripsy and stent exchange.  She was discharged home on 12/02/2021 after nearly 5 months in the hospital and SNF and LTAC.  She completed antibiotic yesterday and she is here for a follow-up.  She had a stage IV sacral decubitus which had developed during the course of her illness and it was debrided and she had completed 6 weeks of IV antibiotics at the skilled nursing facility.  She currently has a wound VAC.  She is now complaining of pain at the wound VAC because of her sitting in the wheelchair.  Her husband says it is very difficult for her to be transported for different appointments.  She has not seen the surgeon as outpatient yet.  She does not have any fever.  Appetite is better.  Physical therapy has not started yet at home.    Patient has a complicated past medical history 06/23/2021 patient had presented to urgent care with cough and body ache.  She was sent to the ED.  She was found to have acute kidney injury.  The creatinine was 9.32 from  1.97.  She also had a potassium of 5.5 at that admission.  Dialysis catheter was placed on 06/29/2021.  On the night of 06/29/2021 she developed severe abdominal pain and was diagnosed with large amount of pneumoperitoneum on CT abdomen with free fluid.  A left renal mass measuring 6.1 into 8 into 4.1 cm suspicious for renal cell carcinoma was also noted.  She will come to the ER on 06/30/2021 and underwent expiratory laparotomy and primary repair of the perforated duodenal ulcer with creation of an omental flap.  She was sent to ICU.  She remained intubated.  She was also on dialysis.  As she continued to spike fever and with leukocytosis repeat CT scan was done on 07/04/2021 and that revealed a 14 mm left UPJ stone with mild left hydronephrosis and left upper pole renal lesion.  There was also right hydroureteronephrosis.  Urine culture was yeast.  Urology saw the patient and she underwent cystoscopy with bilateral ureteral stent placement.  Left pyelonephrosis was seen and culture was sent from left renal pelvis urine and it showed 70,000 colonies of Candida glabrata.  The right kidney urine was also sent and it had 100,000 colonies of yeast.  I saw the patient on 07/07/2021.  Patient initially was placed on fluconazole 200 mg and increased to 400 mg and later changed to Amphotericin because of persistent fever on 07/20/2021.[name]  She also had a left perirenal and renal abscess for which a drain was placed on  07/21/2021.  The culture was again positive for Candida glabrata.  She was on intermittent dialysis.  Because of persistent leukocytosis a repeat CT scan done on 07/29/2021 showed a 10 cm into 2.29 cm anterior pararenal space collection not in communication with the drain and hence the old drain was removed and a new drain was placed on 08/01/2021. She also had a PEG  placement on 08/01/2021. She was transferred to [facility] on 08/03/2021.  Following which [facility] went [facility].  She saw the urologist Dr. Bernardo Heater on  10/26/2021 and he was planning to schedule ureteroscopy for definitive stone treatment.  drain dell out by itselfPatient had a CT abdomen done on 11/07/2021 and that CT scan is mentioned that the drains had been removed and also the size of the pararenal abscess was much smaller.  Past Medical History:  Diagnosis Date   Anxiety    Cataract    Depression    Diabetes mellitus without complication ([place])    Gout    History of kidney stones    Hyperlipidemia    Hypertension    Vertigo     Past Surgical History:  Procedure Laterality Date   CATARACT EXTRACTION, BILATERAL Bilateral 2019   COLONOSCOPY WITH PROPOFOL N/A 08/19/2015   Procedure: COLONOSCOPY WITH PROPOFOL;  Surgeon: Lucilla Lame, MD;  Location: [place];  Service: Endoscopy;  Laterality: N/A;  diabetic - insulin   CYSTOSCOPY W/ RETROGRADES Bilateral 12/22/2019   Procedure: CYSTOSCOPY WITH RETROGRADE PYELOGRAM;  Surgeon: Abbie Sons, MD;  Location: [facility];  Service: Urology;  Laterality: Bilateral;   CYSTOSCOPY W/ URETERAL STENT PLACEMENT Bilateral 07/05/2021   Procedure: CYSTOSCOPY WITH RETROGRADE PYELOGRAM/BILATERAL URETERAL STENT PLACEMENT;  Surgeon: Abbie Sons, MD;  Location: [facility];  Service: Urology;  Laterality: Bilateral;   CYSTOSCOPY WITH BIOPSY N/A 12/22/2019   Procedure: CYSTOSCOPY WITH bladder BIOPSY;  Surgeon: Abbie Sons, MD;  Location: [facility];  Service: Urology;  Laterality: N/A;   CYSTOSCOPY WITH STENT PLACEMENT Right 12/22/2019   Procedure: CYSTOSCOPY WITH STENT PLACEMENT;  Surgeon: Abbie Sons, MD;  Location: [facility];  Service: Urology;  Laterality: Right;   CYSTOSCOPY WITH URETEROSCOPY Bilateral 12/22/2019   Procedure: CYSTOSCOPY WITH URETEROSCOPY;  Surgeon: Abbie Sons, MD;  Location: [facility];  Service: Urology;  Laterality: Bilateral;   CYSTOSCOPY/URETEROSCOPY/HOLMIUM LASER/STENT PLACEMENT Right 12/22/2019   Procedure: CYSTOSCOPY/URETEROSCOPY/HOLMIUM LASER/STENT PLACEMENT;   Surgeon: Abbie Sons, MD;  Location: [facility];  Service: Urology;  Laterality: Right;   CYSTOSCOPY/URETEROSCOPY/HOLMIUM LASER/STENT PLACEMENT Bilateral 12/08/2021   Procedure: CYSTOSCOPY/URETEROSCOPY/HOLMIUM LASER/STENT PLACEMENT;  Surgeon: [name], MD;  Location: [facility];  Service: Urology;  Laterality: Bilateral;   DIALYSIS/PERMA CATHETER INSERTION N/A 06/29/2021   Procedure: DIALYSIS/PERMA CATHETER INSERTION;  Surgeon: Algernon Huxley, MD;  Location: [place] [facility];  Service: Cardiovascular;  Laterality: N/A;   DIALYSIS/PERMA CATHETER INSERTION N/A 07/13/2021   Procedure: DIALYSIS/PERMA CATHETER INSERTION;  Surgeon: Algernon Huxley, MD;  Location: [place] [facility];  Service: Cardiovascular;  Laterality: N/A;   EYE SURGERY     IR GASTROSTOMY TUBE MOD SED  08/01/2021   LAPAROTOMY N/A 06/29/2021   Procedure: EXPLORATORY LAPAROTOMY WITH REPAIR OF DUODENAL PERFORATION;  Surgeon: [name], MD;  Location: [facility];  Service: General;  Laterality: N/A;   POLYPECTOMY  08/19/2015   Procedure: POLYPECTOMY;  Surgeon: Lucilla Lame, MD;  Location: [place];  Service: Endoscopy;;   TUBAL LIGATION  1992    Social History   Socioeconomic History  Marital status: Married    Spouse name: Not on file   Number of children: Not on file   Years of education: Not on file   Highest education level: Not on file  Occupational History   Not on file  Tobacco Use   Smoking status: Former    Packs/day: 0.75    Years: 6.00    Pack years: 4.50    Types: Cigarettes    Quit date: 2    Years since quitting: 50.0   Smokeless tobacco: Never   Tobacco comments:    quit 1973  Vaping Use   Vaping Use: Never used  Substance and Sexual Activity   Alcohol use: No    Alcohol/week: 0.0 standard drinks   Drug use: No   Sexual activity: Not on file  Other Topics Concern   Not on file  Social History Narrative   Not on file   Social Determinants of Health   Financial Resource Strain:  Not on file  Food Insecurity: Not on file  Transportation Needs: Not on file  Physical Activity: Not on file  Stress: Not on file  Social Connections: Not on file  Intimate Partner Violence: Not on file    Family History  Problem Relation Age of Onset   Diabetes Father    Cancer Father        lung cancer   Diabetes Brother    Stroke Mother    Allergies  Allergen Reactions   Contrast Media  [Iodinated Contrast Media] Anaphylaxis   Iodine Swelling    (IV only) - angioedema   I? Current Outpatient Medications  Medication Sig Dispense Refill   aspirin 81 MG EC tablet Take by mouth.     atorvastatin (LIPITOR) 20 MG tablet Take 1 tablet (20 mg total) by mouth daily. (Patient taking differently: Take 10 mg by mouth at bedtime.) 90 tablet 3   buPROPion (WELLBUTRIN) 100 MG tablet Place 100 mg into feeding tube daily.     busPIRone (BUSPAR) 5 MG tablet Place 5 mg into feeding tube daily.     cloNIDine (CATAPRES) 0.1 MG tablet Take 1 tablet (0.1 mg total) by mouth 2 (two) times daily. 60 tablet 11   Exenatide ER (BYDUREON BCISE) 2 MG/0.85ML AUIJ      fluticasone (FLONASE) 50 MCG/ACT nasal spray Place 1 spray into both nostrils daily.     insulin detemir (LEVEMIR) 100 UNIT/ML injection Inject 0.3 mLs (30 Units total) into the skin 2 (two) times daily. 10 mL 11   insulin regular (NOVOLIN R) 100 units/mL injection Inject 0-10 Units into the skin 4 (four) times daily -  before meals and at bedtime. (Based upon sliding scale)     iron polysaccharides (NIFEREX) 150 MG capsule Take 1 capsule (150 mg total) by mouth daily. 30 capsule 4   lansoprazole (PREVACID) 30 MG capsule Take 30 mg by mouth at bedtime.     loperamide (IMODIUM A-D) 2 MG tablet Take 2 mg by mouth every 6 (six) hours as needed for diarrhea or loose stools.     metoprolol tartrate (LOPRESSOR) 100 MG tablet Take 1 tablet (100 mg total) by mouth 2 (two) times daily. 60 tablet 2   Multiple Vitamin (MULTIVITAMIN WITH MINERALS) TABS  tablet Take 1 tablet by mouth daily. 30 tablet 3   nystatin (MYCOSTATIN/NYSTOP) powder Apply 1 application topically 2 (two) times daily for 13 days. (Apply under the breasts) 15 g 0   oxyCODONE-acetaminophen (PERCOCET/ROXICET) 5-325 MG tablet Take 1 tablet by  mouth every 6 (six) hours as needed for moderate pain or severe pain. 20 tablet 0   polyethylene glycol (MIRALAX / GLYCOLAX) 17 g packet Take 17 g by mouth daily.     senna-docusate (SENOKOT-S) 8.6-50 MG tablet Place 1 tablet into feeding tube 2 (two) times daily.     sitaGLIPtin (JANUVIA) 25 MG tablet Place 1 tablet (25 mg total) into feeding tube daily. 90 tablet 1   No current facility-administered medications for this visit.     Abtx:  Anti-infectives (From admission, onward)    None       REVIEW OF SYSTEMS:  Const: negative fever, negative chills,  weight loss Eyes: negative diplopia or visual changes, negative eye pain ENT: negative coryza, negative sore throat Resp: negative cough, hemoptysis, dyspnea Cards: negative for chest pain, palpitations, lower extremity edema GU: negative for frequency, dysuria and hematuria GI: Negative for abdominal pain, diarrhea, bleeding, constipation Skin: negative for rash and pruritus Heme: negative for easy bruising and gum/nose bleeding MS: Generalized weakness, right hip pain, inability to bear weight, sacral pain Neurolo:negative for headaches, dizziness, vertigo, memory problems  Psych: anxiety, depression  Endocrine: negative for thyroid, diabetes Allergy/Immunology-as above  Objective:  VITALS:  BP 94/64    Pulse 73    Resp 16    Ht 5\' 3"  (1.6 m)    Wt 192 lb 4.8 oz (87.2 kg)    SpO2 97%    BMI 34.06 kg/m  PHYSICAL EXAM:  General: Awake, chronically ill, pale, and just some distress because of pain at the sacral wound because there is a wound VAC Head: Normocephalic, without obvious abnormality, atraumatic. Eyes: Conjunctivae clear, anicteric sclerae. Pupils are equal ENT  Nares normal. No drainage or sinus tenderness. Lips, mucosa, and tongue normal. No Thrush Neck: Could not, symmetrical, no adenopathy, thyroid: non tender no carotid bruit and no JVD. Back: Could not examine bilateral Because she was in a wheelchair. Lungs: Bilateral air entry clear .  Heart: Irregular but well controlled limit Abdomen: Soft, non-tender,not distended. Bowel sounds normal. No masses Extremities: atraumatic, no cyanosis. No edema. No clubbing Skin: Limited examination. Lymph: Cervical, supraclavicular normal. Neurologic: Grossly non-focal Pertinent Labs Lab Results CBC    Component Value Date/Time   WBC 9.8 12/19/2021 1411   RBC 3.84 (L) 12/19/2021 1411   HGB 9.7 (L) 12/19/2021 1411   HGB 13.9 01/29/2014 0406   HCT 31.7 (L) 12/19/2021 1411   HCT 42.4 01/28/2014 0513   PLT 376 12/19/2021 1411   PLT 217 01/29/2014 0406   MCV 82.6 12/19/2021 1411   MCV 89 01/28/2014 0513   MCH 25.3 (L) 12/19/2021 1411   MCHC 30.6 12/19/2021 1411   RDW 17.5 (H) 12/19/2021 1411   RDW 14.2 01/28/2014 0513   LYMPHSABS 1.7 12/19/2021 1411   LYMPHSABS 1.3 01/28/2014 0513   MONOABS 0.9 12/19/2021 1411   MONOABS 0.4 01/28/2014 0513   EOSABS 0.2 12/19/2021 1411   EOSABS 0.0 01/28/2014 0513   BASOSABS 0.1 12/19/2021 1411   BASOSABS 0.1 01/28/2014 0513    CMP Latest Ref Rng & Units 12/19/2021 12/19/2021 12/06/2021  Glucose 70 - 99 mg/dL 70 70 -  BUN 8 - 23 mg/dL 28(H) 28(H) -  Creatinine 0.44 - 1.00 mg/dL 2.35(H) 2.26(H) 1.52(H)  Sodium 135 - 145 mmol/L 137 137 -  Potassium 3.5 - 5.1 mmol/L 3.6 3.9 -  Chloride 98 - 111 mmol/L 107 105 -  CO2 22 - 32 mmol/L 22 21(L) -  Calcium 8.9 - 10.3 mg/dL  9.0 9.1 -  Total Protein 6.5 - 8.1 g/dL - 6.4(L) -  Total Bilirubin 0.3 - 1.2 mg/dL - 0.3 -  Alkaline Phos 38 - 126 U/L - 106 -  AST 15 - 41 U/L - 20 -  ALT 0 - 44 U/L - 25 -  ? Impression/Recommendation Complicated UTI due to urinary retention leading to bilateral hydronephrosis with  underlying bilateral ureteral stents for renal stones.  Proteus in urine culture.  Was treated with IV ampicillin followed by p.o. amoxicillin. Which she completed yesterday.  No further antibiotic is needed. Underwent laser lithotripsy and ureteral stent exchanges on 12/08/2021.  Has a follow-up appointment with urologist at the end of this month.  Sacral decubitus stage IV at the beginning of breath underwent debridement at the LTAC/nursing home.  Had completed 6 weeks of IV antibiotics in the skilled nursing facility.  While she was recently hospitalized at [facility] the wound looked much improved and a wound VAC was placed to help with healing  Prolonged hospitalization since August 2022. Initially at [facility] in August had a complicated stay in ICU was intubated and developed perforated duodenal ulcer surgery on 06/30/2021 developed AKI was on hemodialysis for a short period of time Had left hydronephrosis and stenting was done on 07/05/2021.  Candida glabrata in urine culture from both kidneys.  Then developed left renal perirenal abscess and had a drain placed on 07/21/2021 she received fluconazole and then IV Amphotericin followed by p.o. fluconazole.  A PICC was placed for feeding.  She was transferred to [facility] on 08/03/2021.  From there [facility] went [facility] October 2022.  She was admitted for the past 2 months and then came to [facility] on December 02, 2021 and stayed until December 12, 2021.  She was discharged home.  Continues to follow-up with surgeon Dr. Lysle Pearl.  Called him and his office will make an appointment for her.  ? ? ___________________________________________________ Discussed with patient, and her husband in detail.  Follow-up as needed.  Note:  This document was prepared using Dragon voice recognition software and may include unintentional dictation errors.

## 2021-12-19 NOTE — ED Triage Notes (Signed)
Pt endorses sacral pain from wound. Wound has been present x6 months and has been progressively getting worse. Pt has wound vac present. Pt with recent hospitalization for UTI. Pt stating she just can't take the pain anymore. Pt also endorses dizziness and states she feels like she is going to pass out.

## 2021-12-19 NOTE — ED Notes (Signed)
Pt states she came in for sacral pain due to a wound. Pt states sacral pain has resolved at this time.

## 2021-12-19 NOTE — ED Notes (Signed)
Pt states she is unable to give a urine sample and does not want to have a in and out cath done.

## 2021-12-20 ENCOUNTER — Encounter: Payer: Self-pay | Admitting: Internal Medicine

## 2021-12-20 DIAGNOSIS — L89154 Pressure ulcer of sacral region, stage 4: Secondary | ICD-10-CM | POA: Diagnosis not present

## 2021-12-20 DIAGNOSIS — N179 Acute kidney failure, unspecified: Secondary | ICD-10-CM | POA: Diagnosis not present

## 2021-12-20 DIAGNOSIS — N189 Chronic kidney disease, unspecified: Secondary | ICD-10-CM | POA: Diagnosis not present

## 2021-12-20 DIAGNOSIS — I10 Essential (primary) hypertension: Secondary | ICD-10-CM | POA: Diagnosis not present

## 2021-12-20 LAB — CBC
HCT: 29.4 % — ABNORMAL LOW (ref 36.0–46.0)
Hemoglobin: 9 g/dL — ABNORMAL LOW (ref 12.0–15.0)
MCH: 25.5 pg — ABNORMAL LOW (ref 26.0–34.0)
MCHC: 30.6 g/dL (ref 30.0–36.0)
MCV: 83.3 fL (ref 80.0–100.0)
Platelets: 316 10*3/uL (ref 150–400)
RBC: 3.53 MIL/uL — ABNORMAL LOW (ref 3.87–5.11)
RDW: 17.4 % — ABNORMAL HIGH (ref 11.5–15.5)
WBC: 8.2 10*3/uL (ref 4.0–10.5)
nRBC: 0 % (ref 0.0–0.2)

## 2021-12-20 LAB — GLUCOSE, CAPILLARY
Glucose-Capillary: 82 mg/dL (ref 70–99)
Glucose-Capillary: 87 mg/dL (ref 70–99)
Glucose-Capillary: 127 mg/dL — ABNORMAL HIGH (ref 70–99)
Glucose-Capillary: 125 mg/dL — ABNORMAL HIGH (ref 70–99)
Glucose-Capillary: 178 mg/dL — ABNORMAL HIGH (ref 70–99)

## 2021-12-20 LAB — URINALYSIS, ROUTINE W REFLEX MICROSCOPIC
Bilirubin Urine: NEGATIVE
Glucose, UA: NEGATIVE mg/dL
Nitrite: NEGATIVE
Protein, ur: >300 mg/dL — AB
Specific Gravity, Urine: 1.02 (ref 1.005–1.030)
pH: 5.5 (ref 5.0–8.0)

## 2021-12-20 LAB — BASIC METABOLIC PANEL
Anion gap: 8 (ref 5–15)
BUN: 26 mg/dL — ABNORMAL HIGH (ref 8–23)
CO2: 20 mmol/L — ABNORMAL LOW (ref 22–32)
Calcium: 8.5 mg/dL — ABNORMAL LOW (ref 8.9–10.3)
Chloride: 111 mmol/L (ref 98–111)
Creatinine, Ser: 1.94 mg/dL — ABNORMAL HIGH (ref 0.44–1.00)
GFR, Estimated: 27 mL/min — ABNORMAL LOW (ref >60)
Glucose, Bld: 83 mg/dL (ref 70–99)
Potassium: 3.5 mmol/L (ref 3.5–5.1)
Sodium: 139 mmol/L (ref 135–145)

## 2021-12-20 LAB — URINALYSIS, MICROSCOPIC (REFLEX)
Bacteria, UA: NONE SEEN
RBC / HPF: >50 RBC/hpf (ref 0–5)
WBC, UA: >50 WBC/hpf (ref 0–5)

## 2021-12-20 MED ORDER — BUSPIRONE HCL 5 MG PO TABS
5.0000 mg | ORAL_TABLET | Freq: Every day | ORAL | Status: DC
  Administered 2021-12-20 – 2021-12-29 (×10): 5 mg via ORAL
  Filled 2021-12-20 (×10): qty 1

## 2021-12-20 MED ORDER — BUPROPION HCL 100 MG PO TABS
100.0000 mg | ORAL_TABLET | Freq: Every day | ORAL | Status: DC
  Administered 2021-12-20 – 2021-12-29 (×10): 100 mg via ORAL
  Filled 2021-12-20 (×10): qty 1

## 2021-12-20 MED ORDER — ZINC SULFATE 220 (50 ZN) MG PO CAPS
220.0000 mg | ORAL_CAPSULE | Freq: Every day | ORAL | Status: DC
  Administered 2021-12-20 – 2021-12-29 (×10): 220 mg via ORAL
  Filled 2021-12-20 (×10): qty 1

## 2021-12-20 MED ORDER — ASCORBIC ACID 500 MG PO TABS
500.0000 mg | ORAL_TABLET | Freq: Two times a day (BID) | ORAL | Status: DC
  Administered 2021-12-20 – 2021-12-29 (×18): 500 mg via ORAL
  Filled 2021-12-20 (×18): qty 1

## 2021-12-20 MED ORDER — ADULT MULTIVITAMIN W/MINERALS CH
1.0000 | ORAL_TABLET | Freq: Every day | ORAL | Status: DC
  Administered 2021-12-21 – 2021-12-29 (×9): 1 via ORAL
  Filled 2021-12-20 (×9): qty 1

## 2021-12-20 MED ORDER — ENSURE ENLIVE PO LIQD
237.0000 mL | Freq: Three times a day (TID) | ORAL | Status: DC
  Administered 2021-12-20 – 2021-12-28 (×10): 237 mL via ORAL

## 2021-12-20 MED ORDER — INFLUENZA VAC A&B SA ADJ QUAD 0.5 ML IM PRSY
0.5000 mL | PREFILLED_SYRINGE | Freq: Once | INTRAMUSCULAR | Status: AC
  Administered 2021-12-20: 11:00:00 0.5 mL via INTRAMUSCULAR
  Filled 2021-12-20: qty 0.5

## 2021-12-20 NOTE — Progress Notes (Signed)
PROGRESS NOTE    Heather Lopez  JEH:[id number] DOB: February 20, 1946 DOA: 12/19/2021 PCP: [name], PA-C    Assessment & Plan:   Principal Problem:   AKI (acute kidney injury) ([place]) Active Problems:   Depression   Diabetes mellitus, type II ([place])   Acute kidney injury superimposed on CKD ([place])   Bilateral nephrolithiasis   Acute on chronic anemia   Essential hypertension   Chronic osteomyelitis of coccyx ([place])   Decubitus ulcer of sacral region, stage 4 ([facility])   Lactic acidosis   Status post placement of ureteral stent 12/09/2021   AKI on CKDIIIb: Cr is trending down from day prior.   ACD: likely secondary to chronic blood loss from wound VAC. Will monitor H&H   Lactic acidosis: resolved. No sepsis currently   B/l nephrolithiasis: s/p ureteral stent 12/09/2021. Urine cx ordered    Decubitus ulcer of sacral region/ chronic osteomyelitis of coccyx: stage IV. Continue w/ wound vac.   Depression: severity unknown. Continue on home dose of bupropion, buspirone   DM2: likely poorly controlled. Continue on SSI w/ accuchecks   HTN: continue on metoprolol, clonidine     DVT prophylaxis: lovenox  Code Status: DNR Family Communication:  Disposition Plan: d/c back home w/ HH   Level of care: Med-Surg  Status is: Inpatient  Remains inpatient appropriate because: severity of illness     Consultants:    Procedures:   Antimicrobials:    Subjective: Pt c/o buttocks pain  Objective: Vitals:   12/19/21 2341 12/20/21 0341 12/20/21 0500 12/20/21 0729  BP: (!) 123/52 (!) 149/55  (!) 150/53  Pulse: 69 67  60  Resp: 18 18  19   Temp: 98.4 F (36.9 C) 97.8 F (36.6 C)  97.9 F (36.6 C)  TempSrc: Oral   Oral  SpO2: 97% 96%  98%  Weight:   87.8 kg   Height:        Intake/Output Summary (Last 24 hours) at 12/20/2021 0754 Last data filed at 12/20/2021 0342 Gross per 24 hour  Intake 2300 ml  Output 80 ml  Net 2220 ml   Filed Weights   12/19/21 1335 12/20/21 0500   Weight: 87.2 kg 87.8 kg    Examination:  General exam: Appears calm and comfortable  Respiratory system: Clear to auscultation. Respiratory effort normal. Cardiovascular system: S1 & S2+. No rubs, gallops or clicks.  Gastrointestinal system: Abdomen is nondistended, soft and nontender. Normal bowel sounds heard. Central nervous system: Alert and oriented. Moves all extremities  Psychiatry: Judgement and insight appear normal. Flat mood and affect     Data Reviewed: I have personally reviewed following labs and imaging studies  CBC: Recent Labs  Lab 12/19/21 1238 12/19/21 1411 12/20/21 0432  WBC  --  9.8 8.2  NEUTROABS  --  7.0  --   HGB 10.5* 9.7* 9.0*  HCT 34.7* 31.7* 29.4*  MCV  --  82.6 83.3  PLT  --  376 378   Basic Metabolic Panel: Recent Labs  Lab 12/19/21 1411 12/19/21 2010 12/20/21 0432  NA 137 137 139  K 3.9 3.6 3.5  CL 105 107 111  CO2 21* 22 20*  GLUCOSE 70 70 83  BUN 28* 28* 26*  CREATININE 2.26* 2.35* 1.94*  CALCIUM 9.1 9.0 8.5*   GFR: Estimated Creatinine Clearance: 26.7 mL/min (A) (by C-G formula based on SCr of 1.94 mg/dL (H)). Liver Function Tests: Recent Labs  Lab 12/19/21 1411  AST 20  ALT 25  ALKPHOS 106  BILITOT 0.3  PROT 6.4*  ALBUMIN 2.8*   No results for input(s): LIPASE, AMYLASE in the last 168 hours. No results for input(s): AMMONIA in the last 168 hours. Coagulation Profile: No results for input(s): INR, PROTIME in the last 168 hours. Cardiac Enzymes: No results for input(s): CKTOTAL, CKMB, CKMBINDEX, TROPONINI in the last 168 hours. BNP (last 3 results) No results for input(s): PROBNP in the last 8760 hours. HbA1C: No results for input(s): HGBA1C in the last 72 hours. CBG: Recent Labs  Lab 12/19/21 2301 12/20/21 0412 12/20/21 0730  GLUCAP 95 82 87   Lipid Profile: No results for input(s): CHOL, HDL, LDLCALC, TRIG, CHOLHDL, LDLDIRECT in the last 72 hours. Thyroid Function Tests: No results for input(s): TSH,  T4TOTAL, FREET4, T3FREE, THYROIDAB in the last 72 hours. Anemia Panel: No results for input(s): VITAMINB12, FOLATE, FERRITIN, TIBC, IRON, RETICCTPCT in the last 72 hours. Sepsis Labs: Recent Labs  Lab 12/19/21 1411 12/19/21 2010  LATICACIDVEN 2.0* 0.9    Recent Results (from the past 240 hour(s))  Resp Panel by RT-PCR (Flu A&B, Covid) Nasopharyngeal Swab     Status: None   Collection Time: 12/19/21 11:00 PM   Specimen: Nasopharyngeal Swab; Nasopharyngeal(NP) swabs in vial transport medium  Result Value Ref Range Status   SARS Coronavirus 2 by RT PCR NEGATIVE NEGATIVE Final    Comment: (NOTE) SARS-CoV-2 target nucleic acids are NOT DETECTED.  The SARS-CoV-2 RNA is generally detectable in upper respiratory specimens during the acute phase of infection. The lowest concentration of SARS-CoV-2 viral copies this assay can detect is 138 copies/mL. A negative result does not preclude SARS-Cov-2 infection and should not be used as the sole basis for treatment or other patient management decisions. A negative result may occur with  improper specimen collection/handling, submission of specimen other than nasopharyngeal swab, presence of viral mutation(s) within the areas targeted by this assay, and inadequate number of viral copies(<138 copies/mL). A negative result must be combined with clinical observations, patient history, and epidemiological information. The expected result is Negative.  Fact Sheet for Patients:  EntrepreneurPulse.com.au  Fact Sheet for Healthcare Providers:  IncredibleEmployment.be  This test is no t yet approved or cleared by the [place] FDA and  has been authorized for detection and/or diagnosis of SARS-CoV-2 by FDA under an Emergency Use Authorization (EUA). This EUA will remain  in effect (meaning this test can be used) for the duration of the COVID-19 declaration under Section 564(b)(1) of the Act, 21 U.S.C.section  360bbb-3(b)(1), unless the authorization is terminated  or revoked sooner.       Influenza A by PCR NEGATIVE NEGATIVE Final   Influenza B by PCR NEGATIVE NEGATIVE Final    Comment: (NOTE) The Xpert Xpress SARS-CoV-2/FLU/RSV plus assay is intended as an aid in the diagnosis of influenza from Nasopharyngeal swab specimens and should not be used as a sole basis for treatment. Nasal washings and aspirates are unacceptable for Xpert Xpress SARS-CoV-2/FLU/RSV testing.  Fact Sheet for Patients: EntrepreneurPulse.com.au  Fact Sheet for Healthcare Providers: IncredibleEmployment.be  This test is not yet approved or cleared by the [place] FDA and has been authorized for detection and/or diagnosis of SARS-CoV-2 by FDA under an Emergency Use Authorization (EUA). This EUA will remain in effect (meaning this test can be used) for the duration of the COVID-19 declaration under Section 564(b)(1) of the Act, 21 U.S.C. section 360bbb-3(b)(1), unless the authorization is terminated or revoked.  Performed at [facility], [place], [place]  [place]          Radiology Studies: No results found.      Scheduled Meds:  enoxaparin (LOVENOX) injection  40 mg Subcutaneous Q24H   influenza vaccine adjuvanted  0.5 mL Intramuscular Once   insulin aspart  0-15 Units Subcutaneous TID WC   insulin aspart  0-5 Units Subcutaneous QHS   Continuous Infusions:  sodium chloride Stopped (12/20/21 0027)   feeding supplement (OSMOLITE 1.2 CAL)       LOS: 1 day    Time spent: 33 mins     Wyvonnia Dusky, MD [facility] Pager [phone]  If 7PM-7AM, please contact night-coverage 12/20/2021, 7:54 AM

## 2021-12-20 NOTE — Plan of Care (Signed)
?  Problem: Clinical Measurements: ?Goal: Diagnostic test results will improve ?Outcome: Progressing ?  ?Problem: Safety: ?Goal: Ability to remain free from injury will improve ?Outcome: Progressing ?  ?

## 2021-12-20 NOTE — Consult Note (Addendum)
[facility] Nurse Consult Note: Reason for Consult: Consult requested to apply Vac dressing.  Pt was using one prior to admission.  Husband at the bedside has home Vac machine with him and he plans to take it home to charge, then he will bring back to reconnect when she is discharged. He verbalized understanding and demonstrated he knows how to reconnect the tubing. Wound type: Sacrum with chronic Stage 4 pressure injury; 3X1X2cm, 90% red 10% yellow, bone palpable, small amt tan drainage, no odor Pressure Injury POA: Yes Dressing procedure/placement/frequency: Pt denies need for pain meds and denies c/o pain with dressing change. Applied barrier ring around wound edges, then one piece black foam to wound and bridged track pad to hip to reduce pressure from the tubing. Cont suction on at 173mm. [place] team will plan to change again on Fri if patient is still in the hospital at that time. Julien Girt MSN, RN, [place], [place], [place]

## 2021-12-20 NOTE — Progress Notes (Signed)
PT Cancellation Note  Patient Details Name: Heather Lopez MRN: [id number] DOB: 11/23/1946   Cancelled Treatment:    Reason Eval/Treat Not Completed: Patient declined, no reason specified: Order received, chart reviewed. Per chart review and discussion with OT pt states she is not interested in working with therapy while admitted to hospital, reports having all equipment and assistance at home with plan for [facility]. Will complete PT orders at this time but will reassess pt pending a change in status upon receipt of new PT orders.    Linus Salmons PT, DPT 12/20/21, 1:14 PM

## 2021-12-20 NOTE — TOC Initial Note (Signed)
Transition of Care [facility]) - Initial/Assessment Note    Patient Details  Name: Heather Lopez MRN: [id number] Date of Birth: October 30, 1946  Transition of Care [facility]) CM/SW Contact:    Beverly Sessions, RN Phone Number: 12/20/2021, 1:48 PM  Clinical Narrative:                 Admitted for: AKI Admitted from: From home with husband PCP: Eulas Post  Current home health/prior home health/DME: WC, hospital bed, BSC, and RW Home health RN and PT through [facility] with notified with [facility] Patient wishes to resume at discharge Wound care center appointment made for 2/6  added to AVS         Patient Goals and CMS Choice        Expected Discharge Plan and Services                                                Prior Living Arrangements/Services                       Activities of Daily Living Home Assistive Devices/Equipment: Grab bars in shower ADL Screening (condition at time of admission) Patient's cognitive ability adequate to safely complete daily activities?: No Is the patient deaf or have difficulty hearing?: No Does the patient have difficulty seeing, even when wearing glasses/contacts?: No Does the patient have difficulty concentrating, remembering, or making decisions?: No Patient able to express need for assistance with ADLs?: Yes Does the patient have difficulty dressing or bathing?: Yes Independently performs ADLs?: No Communication: Independent Dressing (OT): Needs assistance Is this a change from baseline?: Pre-admission baseline Grooming: Needs assistance Is this a change from baseline?: Pre-admission baseline Feeding: Independent Bathing: Needs assistance Is this a change from baseline?: Pre-admission baseline Toileting: Needs assistance Is this a change from baseline?: Pre-admission baseline In/Out Bed: Dependent Is this a change from baseline?: Pre-admission baseline Walks in Home: Dependent Is this a change  from baseline?: Pre-admission baseline Does the patient have difficulty walking or climbing stairs?: Yes Weakness of Legs: Both Weakness of Arms/Hands: Both  Permission Sought/Granted                  Emotional Assessment              Admission diagnosis:  AKI (acute kidney injury) ([place]) [N17.9] Patient Active Problem List   Diagnosis Date Noted   Decubitus ulcer of sacral region, stage 4 ([place]) 12/19/2021   Lactic acidosis 12/19/2021   Status post placement of ureteral stent 12/09/2021 12/19/2021   AKI (acute kidney injury) ([place]) 12/19/2021   UTI (urinary tract infection) 12/02/2021   Chronic osteomyelitis of coccyx ([place]) 12/02/2021   Abdominal pain 12/02/2021   Nausea & vomiting 12/02/2021   Hyperkalemia 12/02/2021   Hyponatremia 12/02/2021   Hyperglycemia due to diabetes mellitus ([place]) 12/02/2021   Elevated alkaline phosphatase level 12/02/2021   Elevated lipase 12/02/2021   Hypoalbuminemia due to protein-calorie malnutrition ([place]) 12/02/2021   Obesity (BMI 30-39.9) 12/02/2021   Anxiety 10/10/2021   Primary localized osteoarthritis of pelvic region and thigh 10/10/2021   Benign hypertensive kidney disease with chronic kidney disease 09/26/2021   Secondary hyperparathyroidism of renal origin ([place]) 09/26/2021   Hypokalemia 07/20/2021   Renal abscess    Essential hypertension    Lethargy  Duodenal ulcer perforation ([place])    Demand ischemia of myocardium ([place])    Atrial flutter with rapid ventricular response ([place]) 07/07/2021   Endotracheally intubated 07/07/2021   Bilateral nephrolithiasis 07/07/2021   Acute on chronic anemia 07/07/2021   Pressure injury of skin 06/30/2021   Pneumoperitoneum    Acute respiratory failure with hypoxia ([place]) 06/27/2021   Severe sepsis with septic shock ([place]) 06/27/2021   Acute lower UTI 06/27/2021   Acute kidney injury superimposed on CKD ([place]) 06/27/2021   Diabetes mellitus, type II ([place]) 03/03/2018   Vitamin D deficiency  08/21/2016   IBS (irritable bowel syndrome) 08/21/2016   Special screening for malignant neoplasms, colon    Benign neoplasm of transverse colon    Benign neoplasm of sigmoid colon    Uncontrolled type 2 diabetes mellitus with insulin therapy ([place]) 07/12/2015   Benign hypertension with CKD (chronic kidney disease) stage III ([place]) 07/12/2015   Hyperlipidemia associated with type 2 diabetes mellitus ([place]) 07/12/2015   Depression 07/12/2015   BMI 40.0-44.9, adult ([place]) 07/12/2015   PCP:  [name], PA-C Pharmacy:   [facility] - [address], [place] [address] Phone: [phone] Fax: [phone]  [id number] - [place], [place] - [address] [place] [address] Phone: [phone] Fax: [phone]     Social Determinants of Health (SDOH) Interventions    Readmission Risk Interventions Readmission Risk Prevention Plan 12/03/2021  Transportation Screening Complete  Medication Review (RN Care Manager) Complete  PCP or Specialist appointment within 3-5 days of discharge Complete  SW Recovery Care/Counseling Consult Complete  Palliative Care Screening Not Applicable  Skilled Nursing Facility Complete  Some recent data might be hidden

## 2021-12-20 NOTE — Progress Notes (Signed)
OT Cancellation Note  Patient Details Name: Heather Lopez MRN: [id number] DOB: [birth year]   Cancelled Treatment:    Reason Eval/Treat Not Completed: Patient declined, no reason specified. Order received, chart reviewed. Pt familiar to therapy services. Upon arrival, pt sidelying in bed with spouse at bedside. Pt states she is not interested in working with therapy while admitted to hospital, reports having all equipment and assistance at home with plan for [facility]. Reports baseline is BSC/wheelchair SPT, spouse assists. Pt instructed on importance of OOB activity while hospitalized, politely declined. Instructed on process to request OT from RN/MD if needs change. Will sign off.   Dessie Coma, M.S. OTR/L  12/20/21, 11:13 AM  ascom [phone]

## 2021-12-20 NOTE — Progress Notes (Signed)
Initial Nutrition Assessment  DOCUMENTATION CODES:  Obesity unspecified  INTERVENTION:  Recommend liberalizing diet to regular.  Recommend adding 500 mg of Vitamin C BID.  Recommend adding 220 mg of Zinc daily for 14 days.  Add Ensure Plus High Protein po TID, each supplement provides 350 kcal and 20 grams of protein.   Add MVI with minerals daily.  Encourage PO and supplement intake.  NUTRITION DIAGNOSIS:  Increased nutrient needs related to wound healing as evidenced by estimated needs.  GOAL:  Patient will meet greater than or equal to 90% of their needs  MONITOR:  PO intake, Supplement acceptance, Diet advancement, Labs, Weight trends, Skin, I & O's  REASON FOR ASSESSMENT:  Malnutrition Screening Tool    ASSESSMENT:  76 yo female with a PMH of T2DM, HTN, nonambulatory at baseline with stage IV sacrum decubitus with chronic coccygeal osteomyelitis currently on wound VAC, CKD stage 3b, anemia of chronic disease, nephrolithiasis , hospitalized from 1/7-1/17 with severe sepsis secondary to UTI, undergoing lithotripsy and bilateral ureteral stent placement on 1/14 with Foley removal on 1/16, who presented to the emergency room initially for worsening sacral pain. Admitted with AKI on CKD stage 3b.  TF feeding ordered. RD to discontinue TF order as pt is eating now by mouth. Of note, pt had a PEG placed 10/15/2021.  Spoke with pt at bedside. Pt reports losing 40 lbs over the past 6 months after spending time in the ICU. She reports the wound came from not being turned.  Per Epic, pt ate 100% of breakfast and 50% of lunch today.  She reports that she will drink chocolate Ensure, but will not take any other supplements. RD to add Vitamin C and zinc for wound healing, as well as MVI with minerals daily.  Per Epic, pt's weight remains stable over the past month.  Of note, pt with mild BLE edema.  Also of note, pt seen earlier this month. Closely followed by several other RD  during previous admissions.  Medications: reviewed; SSI, Vicodin PO PRN (given twice today)   Labs: reviewed; CBG 87-127 HbA1c: 5.9% (12/02/2021)  NUTRITION - FOCUSED PHYSICAL EXAM: Flowsheet Row Most Recent Value  Orbital Region No depletion  Upper Arm Region Mild depletion  Thoracic and Lumbar Region No depletion  Buccal Region No depletion  Temple Region No depletion  Clavicle Bone Region No depletion  Clavicle and Acromion Bone Region No depletion  Scapular Bone Region No depletion  Dorsal Hand No depletion  Patellar Region No depletion  Anterior Thigh Region No depletion  Posterior Calf Region Mild depletion  Edema (RD Assessment) Mild  Hair Reviewed  Eyes Reviewed  Mouth Reviewed  Skin Reviewed  Nails Reviewed   Diet Order:   Diet Order             Diet regular Room service appropriate? Yes; Fluid consistency: Thin  Diet effective now                  EDUCATION NEEDS:  Education needs have been addressed  Skin:  Skin Assessment: Skin Integrity Issues: Skin Integrity Issues:: Stage IV, Incisions Stage IV: Sacrum Incisions: Vagina (1/13)  Last BM:  12/19/21  Height:  Ht Readings from Last 1 Encounters:  12/19/21 5\' 3"  (1.6 m)   Weight:  Wt Readings from Last 1 Encounters:  12/20/21 87.8 kg   BMI:  Body mass index is 34.29 kg/m.  Estimated Nutritional Needs:  Kcal:  1800-2000 Protein:  105-120 grams Fluid:  >1.8 L  Derrel Nip, RD, LDN (she/her/hers) Clinical Inpatient Dietitian RD Pager/After-Hours/Weekend Pager # [phone]

## 2021-12-21 ENCOUNTER — Inpatient Hospital Stay: Payer: Medicare Other | Admitting: Infectious Diseases

## 2021-12-21 ENCOUNTER — Telehealth: Payer: Self-pay | Admitting: Internal Medicine

## 2021-12-21 LAB — CBC
HCT: 30.6 % — ABNORMAL LOW (ref 36.0–46.0)
Hemoglobin: 9.2 g/dL — ABNORMAL LOW (ref 12.0–15.0)
MCH: 25.3 pg — ABNORMAL LOW (ref 26.0–34.0)
MCHC: 30.1 g/dL (ref 30.0–36.0)
MCV: 84.3 fL (ref 80.0–100.0)
Platelets: 315 10*3/uL (ref 150–400)
RBC: 3.63 MIL/uL — ABNORMAL LOW (ref 3.87–5.11)
RDW: 17.1 % — ABNORMAL HIGH (ref 11.5–15.5)
WBC: 6.6 10*3/uL (ref 4.0–10.5)
nRBC: 0 % (ref 0.0–0.2)

## 2021-12-21 LAB — BASIC METABOLIC PANEL
Anion gap: 7 (ref 5–15)
BUN: 23 mg/dL (ref 8–23)
CO2: 21 mmol/L — ABNORMAL LOW (ref 22–32)
Calcium: 8.8 mg/dL — ABNORMAL LOW (ref 8.9–10.3)
Chloride: 109 mmol/L (ref 98–111)
Creatinine, Ser: 1.52 mg/dL — ABNORMAL HIGH (ref 0.44–1.00)
GFR, Estimated: 36 mL/min — ABNORMAL LOW (ref >60)
Glucose, Bld: 154 mg/dL — ABNORMAL HIGH (ref 70–99)
Potassium: 3.9 mmol/L (ref 3.5–5.1)
Sodium: 137 mmol/L (ref 135–145)

## 2021-12-21 LAB — GLUCOSE, CAPILLARY
Glucose-Capillary: 165 mg/dL — ABNORMAL HIGH (ref 70–99)
Glucose-Capillary: 125 mg/dL — ABNORMAL HIGH (ref 70–99)
Glucose-Capillary: 216 mg/dL — ABNORMAL HIGH (ref 70–99)
Glucose-Capillary: 177 mg/dL — ABNORMAL HIGH (ref 70–99)

## 2021-12-21 MED ORDER — CLONIDINE HCL 0.1 MG PO TABS
0.1000 mg | ORAL_TABLET | Freq: Two times a day (BID) | ORAL | Status: DC
  Administered 2021-12-21 – 2021-12-29 (×17): 0.1 mg via ORAL
  Filled 2021-12-21 (×17): qty 1

## 2021-12-21 MED ORDER — METOPROLOL TARTRATE 50 MG PO TABS
50.0000 mg | ORAL_TABLET | Freq: Two times a day (BID) | ORAL | Status: DC
  Administered 2021-12-21 – 2021-12-29 (×17): 50 mg via ORAL
  Filled 2021-12-21 (×17): qty 1

## 2021-12-21 MED ORDER — DOCUSATE SODIUM 100 MG PO CAPS
200.0000 mg | ORAL_CAPSULE | Freq: Two times a day (BID) | ORAL | Status: DC
  Administered 2021-12-21 – 2021-12-27 (×9): 200 mg via ORAL
  Administered 2021-12-27: 100 mg via ORAL
  Administered 2021-12-28: 200 mg via ORAL
  Administered 2021-12-29: 100 mg via ORAL
  Filled 2021-12-21 (×16): qty 2

## 2021-12-21 NOTE — Progress Notes (Addendum)
PROGRESS NOTE    MERIDEE BRANUM  GUR:[id number] DOB: [birth year] DOA: 12/19/2021 PCP: [name], PA-C    Assessment & Plan:   Principal Problem:   AKI (acute kidney injury) ([place]) Active Problems:   Depression   Diabetes mellitus, type II ([place])   Acute kidney injury superimposed on CKD ([place])   Bilateral nephrolithiasis   Acute on chronic anemia   Essential hypertension   Chronic osteomyelitis of coccyx ([place])   Decubitus ulcer of sacral region, stage 4 (H[facility])   Lactic acidosis   Status post placement of ureteral stent 12/09/2021   AKI on CKDIIIb: Cr is trending down daily    ACD: likely secondary to chronic blood loss from wound VAC. H&H are labile   Lactic acidosis: resolved. No sepsis currently   B/l nephrolithiasis: s/p ureteral stent 12/09/2021. Urine cx is pending    Decubitus ulcer of sacral region/ chronic osteomyelitis of coccyx: stage IV. Continue w/ wound vac. W/ buttocks pain. Norco prn   Depression: severity unknown. Continue on home dose of bupropion, buspirone    DM2: likely poorly controlled. Continue on SSI w/ accuchecks   HTN: continue on metoprolol, clonidine   Obesity: BMI 33.6. Complicates overall care & prognosis  DVT prophylaxis: lovenox  Code Status: DNR Family Communication:  Disposition Plan: d/c back home w/ HH   Level of care: Med-Surg  Status is: Inpatient  Remains inpatient appropriate because: severity of illness     Consultants:    Procedures:   Antimicrobials:    Subjective: Pt still c/o buttocks pain   Objective: Vitals:   12/20/21 1459 12/20/21 1914 12/21/21 0435 12/21/21 0444  BP: (!) 146/45 (!) 142/56 (!) 168/62   Pulse: 70 73 69   Resp: 16 16 20    Temp: 98.9 F (37.2 C) 98.6 F (37 C) 98.7 F (37.1 C)   TempSrc: Oral Oral Oral   SpO2: 96% 97% 98%   Weight:    86.1 kg  Height:        Intake/Output Summary (Last 24 hours) at 12/21/2021 0746 Last data filed at 12/21/2021 0503 Gross per 24 hour  Intake 540  ml  Output 950 ml  Net -410 ml   Filed Weights   12/19/21 1335 12/20/21 0500 12/21/21 0444  Weight: 87.2 kg 87.8 kg 86.1 kg    Examination:  General exam: Appears uncomfortable Respiratory system: clear breath sounds b/l  Cardiovascular system: S1/S2+. No rubs or clicks Gastrointestinal system: Abd is soft, NT, obese & hypoactive bowel sounds Central nervous system: alert and oriented. Moves all extremities  Psychiatry: judgement and insight appears normal. Flat mood and affect     Data Reviewed: I have personally reviewed following labs and imaging studies  CBC: Recent Labs  Lab 12/19/21 1238 12/19/21 1411 12/20/21 0432 12/21/21 0425  WBC  --  9.8 8.2 6.6  NEUTROABS  --  7.0  --   --   HGB 10.5* 9.7* 9.0* 9.2*  HCT 34.7* 31.7* 29.4* 30.6*  MCV  --  82.6 83.3 84.3  PLT  --  376 316 283   Basic Metabolic Panel: Recent Labs  Lab 12/19/21 1411 12/19/21 2010 12/20/21 0432 12/21/21 0425  NA 137 137 139 137  K 3.9 3.6 3.5 3.9  CL 105 107 111 109  CO2 21* 22 20* 21*  GLUCOSE 70 70 83 154*  BUN 28* 28* 26* 23  CREATININE 2.26* 2.35* 1.94* 1.52*  CALCIUM 9.1 9.0 8.5* 8.8*   GFR: Estimated Creatinine Clearance: 33.8 mL/min (  A) (by C-G formula based on SCr of 1.52 mg/dL (H)). Liver Function Tests: Recent Labs  Lab 12/19/21 1411  AST 20  ALT 25  ALKPHOS 106  BILITOT 0.3  PROT 6.4*  ALBUMIN 2.8*   No results for input(s): LIPASE, AMYLASE in the last 168 hours. No results for input(s): AMMONIA in the last 168 hours. Coagulation Profile: No results for input(s): INR, PROTIME in the last 168 hours. Cardiac Enzymes: No results for input(s): CKTOTAL, CKMB, CKMBINDEX, TROPONINI in the last 168 hours. BNP (last 3 results) No results for input(s): PROBNP in the last 8760 hours. HbA1C: No results for input(s): HGBA1C in the last 72 hours. CBG: Recent Labs  Lab 12/20/21 0412 12/20/21 0730 12/20/21 1144 12/20/21 1634 12/20/21 1955  GLUCAP 82 87 127* 125*  178*   Lipid Profile: No results for input(s): CHOL, HDL, LDLCALC, TRIG, CHOLHDL, LDLDIRECT in the last 72 hours. Thyroid Function Tests: No results for input(s): TSH, T4TOTAL, FREET4, T3FREE, THYROIDAB in the last 72 hours. Anemia Panel: No results for input(s): VITAMINB12, FOLATE, FERRITIN, TIBC, IRON, RETICCTPCT in the last 72 hours. Sepsis Labs: Recent Labs  Lab 12/19/21 1411 12/19/21 2010  LATICACIDVEN 2.0* 0.9    Recent Results (from the past 240 hour(s))  Resp Panel by RT-PCR (Flu A&B, Covid) Nasopharyngeal Swab     Status: None   Collection Time: 12/19/21 11:00 PM   Specimen: Nasopharyngeal Swab; Nasopharyngeal(NP) swabs in vial transport medium  Result Value Ref Range Status   SARS Coronavirus 2 by RT PCR NEGATIVE NEGATIVE Final    Comment: (NOTE) SARS-CoV-2 target nucleic acids are NOT DETECTED.  The SARS-CoV-2 RNA is generally detectable in upper respiratory specimens during the acute phase of infection. The lowest concentration of SARS-CoV-2 viral copies this assay can detect is 138 copies/mL. A negative result does not preclude SARS-Cov-2 infection and should not be used as the sole basis for treatment or other patient management decisions. A negative result may occur with  improper specimen collection/handling, submission of specimen other than nasopharyngeal swab, presence of viral mutation(s) within the areas targeted by this assay, and inadequate number of viral copies(<138 copies/mL). A negative result must be combined with clinical observations, patient history, and epidemiological information. The expected result is Negative.  Fact Sheet for Patients:  EntrepreneurPulse.com.au  Fact Sheet for Healthcare Providers:  IncredibleEmployment.be  This test is no t yet approved or cleared by the [place] FDA and  has been authorized for detection and/or diagnosis of SARS-CoV-2 by FDA under an Emergency Use Authorization  (EUA). This EUA will remain  in effect (meaning this test can be used) for the duration of the COVID-19 declaration under Section 564(b)(1) of the Act, 21 U.S.C.section 360bbb-3(b)(1), unless the authorization is terminated  or revoked sooner.       Influenza A by PCR NEGATIVE NEGATIVE Final   Influenza B by PCR NEGATIVE NEGATIVE Final    Comment: (NOTE) The Xpert Xpress SARS-CoV-2/FLU/RSV plus assay is intended as an aid in the diagnosis of influenza from Nasopharyngeal swab specimens and should not be used as a sole basis for treatment. Nasal washings and aspirates are unacceptable for Xpert Xpress SARS-CoV-2/FLU/RSV testing.  Fact Sheet for Patients: EntrepreneurPulse.com.au  Fact Sheet for Healthcare Providers: IncredibleEmployment.be  This test is not yet approved or cleared by the [place] FDA and has been authorized for detection and/or diagnosis of SARS-CoV-2 by FDA under an Emergency Use Authorization (EUA). This EUA will remain in effect (meaning this test can be used)  for the duration of the COVID-19 declaration under Section 564(b)(1) of the Act, 21 U.S.C. section 360bbb-3(b)(1), unless the authorization is terminated or revoked.  Performed at [facility], [address] [place], [place] [address]          Radiology Studies: No results found.      Scheduled Meds:  vitamin C  500 mg Oral BID   buPROPion  100 mg Oral Q1200   busPIRone  5 mg Oral Daily   enoxaparin (LOVENOX) injection  40 mg Subcutaneous Q24H   feeding supplement  237 mL Oral TID BM   insulin aspart  0-15 Units Subcutaneous TID WC   insulin aspart  0-5 Units Subcutaneous QHS   multivitamin with minerals  1 tablet Oral Daily   zinc sulfate  220 mg Oral Daily   Continuous Infusions:     LOS: 2 days    Time spent: 25 mins     Wyvonnia Dusky, MD [facility] Pager [phone]  If 7PM-7AM, please contact  night-coverage 12/21/2021, 7:46 AM

## 2021-12-21 NOTE — Plan of Care (Signed)
°  Problem: Clinical Measurements: Goal: Will remain free from infection Outcome: Progressing Goal: Diagnostic test results will improve Outcome: Progressing   Problem: Pain Managment: Goal: General experience of comfort will improve Outcome: Progressing   Problem: Safety: Goal: Ability to remain free from injury will improve Outcome: Progressing   Problem: Skin Integrity: Goal: Risk for impaired skin integrity will decrease Outcome: Progressing   

## 2021-12-21 NOTE — Telephone Encounter (Signed)
Husband called to cancel pt's appt for 1-27. Currently in the hospital.

## 2021-12-21 NOTE — Plan of Care (Signed)
°  Problem: Clinical Measurements: Goal: Will remain free from infection Outcome: Progressing Goal: Diagnostic test results will improve Outcome: Progressing   Problem: Pain Managment: Goal: General experience of comfort will improve Outcome: Progressing   Problem: Safety: Goal: Ability to remain free from injury will improve Outcome: Progressing

## 2021-12-22 DIAGNOSIS — R300 Dysuria: Secondary | ICD-10-CM

## 2021-12-22 LAB — GLUCOSE, CAPILLARY
Glucose-Capillary: 166 mg/dL — ABNORMAL HIGH (ref 70–99)
Glucose-Capillary: 183 mg/dL — ABNORMAL HIGH (ref 70–99)
Glucose-Capillary: 103 mg/dL — ABNORMAL HIGH (ref 70–99)
Glucose-Capillary: 180 mg/dL — ABNORMAL HIGH (ref 70–99)
Glucose-Capillary: 227 mg/dL — ABNORMAL HIGH (ref 70–99)

## 2021-12-22 LAB — CBC
HCT: 29.9 % — ABNORMAL LOW (ref 36.0–46.0)
Hemoglobin: 9.1 g/dL — ABNORMAL LOW (ref 12.0–15.0)
MCH: 25.9 pg — ABNORMAL LOW (ref 26.0–34.0)
MCHC: 30.4 g/dL (ref 30.0–36.0)
MCV: 84.9 fL (ref 80.0–100.0)
Platelets: 326 10*3/uL (ref 150–400)
RBC: 3.52 MIL/uL — ABNORMAL LOW (ref 3.87–5.11)
RDW: 17.2 % — ABNORMAL HIGH (ref 11.5–15.5)
WBC: 6.2 10*3/uL (ref 4.0–10.5)
nRBC: 0 % (ref 0.0–0.2)

## 2021-12-22 LAB — BASIC METABOLIC PANEL
Anion gap: 5 (ref 5–15)
BUN: 24 mg/dL — ABNORMAL HIGH (ref 8–23)
CO2: 22 mmol/L (ref 22–32)
Calcium: 9 mg/dL (ref 8.9–10.3)
Chloride: 111 mmol/L (ref 98–111)
Creatinine, Ser: 1.55 mg/dL — ABNORMAL HIGH (ref 0.44–1.00)
GFR, Estimated: 35 mL/min — ABNORMAL LOW (ref >60)
Glucose, Bld: 167 mg/dL — ABNORMAL HIGH (ref 70–99)
Potassium: 4.1 mmol/L (ref 3.5–5.1)
Sodium: 138 mmol/L (ref 135–145)

## 2021-12-22 LAB — URINE CULTURE: Culture: <10000 — AB

## 2021-12-22 MED ORDER — PHENAZOPYRIDINE HCL 200 MG PO TABS
200.0000 mg | ORAL_TABLET | Freq: Once | ORAL | Status: AC
  Administered 2021-12-22: 200 mg via ORAL
  Filled 2021-12-22: qty 1

## 2021-12-22 NOTE — Consult Note (Addendum)
[facility] Nurse Consult Note: Reason for Consult: Vac dressing change performed.  Pt medicated for pain prior to performing and tolerated with minimal amt discomfort. Pt was using one prior to admission;  husband is not present but previously said he plans to take the home Vac Acell[place] machine home to charge, then he will bring back to reconnect when she is discharged. He verbalized understanding and demonstrated he knows how to reconnect the tubing. Wound type: Sacrum with chronic Stage 4 pressure injury; no change from previous appearance, small amt tan drainage, no odor Pressure Injury POA: Yes Dressing procedure/placement/frequency: Applied barrier ring around wound edges, then one piece black foam to wound and bridged track pad to hip to reduce pressure from the tubing. Cont suction on at 175mm. [place] team will plan to change again on Mon if patient is still in the hospital at that time. Julien Girt MSN, RN, [place], [place], [place]

## 2021-12-22 NOTE — Plan of Care (Signed)
  Problem: Pain Managment: Goal: General experience of comfort will improve Outcome: Progressing   Problem: Safety: Goal: Ability to remain free from injury will improve Outcome: Progressing   Problem: Skin Integrity: Goal: Risk for impaired skin integrity will decrease Outcome: Progressing   

## 2021-12-22 NOTE — TOC Progression Note (Signed)
Transition of Care [facility]) - Progression Note    Patient Details  Name: Heather Lopez MRN: [id number] Date of Birth: September 17, 1946  Transition of Care [place]) CM/SW Contact  Beverly Sessions, RN Phone Number: 12/22/2021, 2:05 PM  Clinical Narrative:     Urine cultures pending plan remains for patient to return home with home health services resumed through [facility]        Expected Discharge Plan and Services                                                 Social Determinants of Health (SDOH) Interventions    Readmission Risk Interventions Readmission Risk Prevention Plan 12/20/2021 12/03/2021  Transportation Screening Complete Complete  HRI or Home Care Consult Complete -  Social Work Consult for [place] Planning/Counseling Complete -  Palliative Care Screening Complete -  Medication Review Press photographer) Complete Complete  PCP or Specialist appointment within 3-5 days of discharge - Complete  SW Recovery Care/Counseling Consult - Complete  [place] - Complete  Some recent data might be hidden

## 2021-12-22 NOTE — Progress Notes (Signed)
PROGRESS NOTE    Heather Lopez  YOV:[id number] DOB: [birth year] DOA: 12/19/2021 PCP: [name], PA-C    Assessment & Plan:   Principal Problem:   AKI (acute kidney injury) ([place]) Active Problems:   Depression   Diabetes mellitus, type II ()   Acute kidney injury superimposed on CKD ([place])   Bilateral nephrolithiasis   Acute on chronic anemia   Essential hypertension   Chronic osteomyelitis of coccyx ([place])   Decubitus ulcer of sacral region, stage 4 (HCC)   Lactic acidosis   Status post placement of ureteral stent 12/09/2021   AKI on CKDIIIb: Cr is trending up today. Avoid nephrotoxic meds    ACD: likely secondary to chronic blood loss from wound VAC. H&H are stable   Lactic acidosis: resolved. No sepsis currently   B/l nephrolithiasis: s/p ureteral stent 12/09/2021. Urine cx shows insignificant growth. Significant dysuria today, repeat urine culture ordered. Pyridium x 1.    Decubitus ulcer of sacral region/ chronic osteomyelitis of coccyx: stage IV. Wound vac change today. Still w/ buttocks pain   Depression: severity unknown. Continue on home dose of buspirone, bupropion   DM2: well controlled, HbA1c 5.9. Continue on SSI w/ accuchecks    HTN: continue on home dose of clonidine, metoprolol   Obesity: BMI 32.9. Complicates overall care & prognosis   DVT prophylaxis: lovenox  Code Status: DNR Family Communication:  Disposition Plan: d/c back home w/ HH   Level of care: Med-Surg  Status is: Inpatient  Remains inpatient appropriate because: severity of illness     Consultants:    Procedures:   Antimicrobials:    Subjective: Pt c/o burning with urination   Objective: Vitals:   12/21/21 1548 12/21/21 1934 12/22/21 0456 12/22/21 0500  BP: (!) 149/63 (!) 164/58 (!) 149/56   Pulse: 66 68 (!) 55   Resp: 20 18 18    Temp: 98.6 F (37 C) 98.8 F (37.1 C) 97.7 F (36.5 C)   TempSrc: Oral Oral Oral   SpO2: 99% 98% 99%   Weight:    84.3 kg  Height:         Intake/Output Summary (Last 24 hours) at 12/22/2021 0740 Last data filed at 12/21/2021 2034 Gross per 24 hour  Intake 420 ml  Output --  Net 420 ml   Filed Weights   12/20/21 0500 12/21/21 0444 12/22/21 0500  Weight: 87.8 kg 86.1 kg 84.3 kg    Examination:  General exam:appears calm but uncomfortable  Respiratory system: clear breath sounds b/l. No wheezes, rales  Cardiovascular system: S1 & S2+. No rubs or clicks Gastrointestinal system: Abd is soft, NT, obese & normal bowel sounds  Central nervous system: alert and oriented. Moves all extremities   Psychiatry: judgement and insight appear normal. Flat mood and affect     Data Reviewed: I have personally reviewed following labs and imaging studies  CBC: Recent Labs  Lab 12/19/21 1238 12/19/21 1411 12/20/21 0432 12/21/21 0425 12/22/21 0425  WBC  --  9.8 8.2 6.6 6.2  NEUTROABS  --  7.0  --   --   --   HGB 10.5* 9.7* 9.0* 9.2* 9.1*  HCT 34.7* 31.7* 29.4* 30.6* 29.9*  MCV  --  82.6 83.3 84.3 84.9  PLT  --  376 316 315 741   Basic Metabolic Panel: Recent Labs  Lab 12/19/21 1411 12/19/21 2010 12/20/21 0432 12/21/21 0425 12/22/21 0425  NA 137 137 139 137 138  K 3.9 3.6 3.5 3.9 4.1  CL 105  107 111 109 111  CO2 21* 22 20* 21* 22  GLUCOSE 70 70 83 154* 167*  BUN 28* 28* 26* 23 24*  CREATININE 2.26* 2.35* 1.94* 1.52* 1.55*  CALCIUM 9.1 9.0 8.5* 8.8* 9.0   GFR: Estimated Creatinine Clearance: 32.8 mL/min (A) (by C-G formula based on SCr of 1.55 mg/dL (H)). Liver Function Tests: Recent Labs  Lab 12/19/21 1411  AST 20  ALT 25  ALKPHOS 106  BILITOT 0.3  PROT 6.4*  ALBUMIN 2.8*   No results for input(s): LIPASE, AMYLASE in the last 168 hours. No results for input(s): AMMONIA in the last 168 hours. Coagulation Profile: No results for input(s): INR, PROTIME in the last 168 hours. Cardiac Enzymes: No results for input(s): CKTOTAL, CKMB, CKMBINDEX, TROPONINI in the last 168 hours. BNP (last 3 results) No  results for input(s): PROBNP in the last 8760 hours. HbA1C: No results for input(s): HGBA1C in the last 72 hours. CBG: Recent Labs  Lab 12/20/21 1955 12/21/21 0750 12/21/21 1147 12/21/21 1620 12/21/21 2132  GLUCAP 178* 165* 125* 216* 177*   Lipid Profile: No results for input(s): CHOL, HDL, LDLCALC, TRIG, CHOLHDL, LDLDIRECT in the last 72 hours. Thyroid Function Tests: No results for input(s): TSH, T4TOTAL, FREET4, T3FREE, THYROIDAB in the last 72 hours. Anemia Panel: No results for input(s): VITAMINB12, FOLATE, FERRITIN, TIBC, IRON, RETICCTPCT in the last 72 hours. Sepsis Labs: Recent Labs  Lab 12/19/21 1411 12/19/21 2010  LATICACIDVEN 2.0* 0.9    Recent Results (from the past 240 hour(s))  Resp Panel by RT-PCR (Flu A&B, Covid) Nasopharyngeal Swab     Status: None   Collection Time: 12/19/21 11:00 PM   Specimen: Nasopharyngeal Swab; Nasopharyngeal(NP) swabs in vial transport medium  Result Value Ref Range Status   SARS Coronavirus 2 by RT PCR NEGATIVE NEGATIVE Final    Comment: (NOTE) SARS-CoV-2 target nucleic acids are NOT DETECTED.  The SARS-CoV-2 RNA is generally detectable in upper respiratory specimens during the acute phase of infection. The lowest concentration of SARS-CoV-2 viral copies this assay can detect is 138 copies/mL. A negative result does not preclude SARS-Cov-2 infection and should not be used as the sole basis for treatment or other patient management decisions. A negative result may occur with  improper specimen collection/handling, submission of specimen other than nasopharyngeal swab, presence of viral mutation(s) within the areas targeted by this assay, and inadequate number of viral copies(<138 copies/mL). A negative result must be combined with clinical observations, patient history, and epidemiological information. The expected result is Negative.  Fact Sheet for Patients:  EntrepreneurPulse.com.au  Fact Sheet for  Healthcare Providers:  IncredibleEmployment.be  This test is no t yet approved or cleared by the [place] FDA and  has been authorized for detection and/or diagnosis of SARS-CoV-2 by FDA under an Emergency Use Authorization (EUA). This EUA will remain  in effect (meaning this test can be used) for the duration of the COVID-19 declaration under Section 564(b)(1) of the Act, 21 U.S.C.section 360bbb-3(b)(1), unless the authorization is terminated  or revoked sooner.       Influenza A by PCR NEGATIVE NEGATIVE Final   Influenza B by PCR NEGATIVE NEGATIVE Final    Comment: (NOTE) The Xpert Xpress SARS-CoV-2/FLU/RSV plus assay is intended as an aid in the diagnosis of influenza from Nasopharyngeal swab specimens and should not be used as a sole basis for treatment. Nasal washings and aspirates are unacceptable for Xpert Xpress SARS-CoV-2/FLU/RSV testing.  Fact Sheet for Patients: EntrepreneurPulse.com.au  Fact Sheet for  Healthcare Providers: IncredibleEmployment.be  This test is not yet approved or cleared by the [place] and has been authorized for detection and/or diagnosis of SARS-CoV-2 by FDA under an Emergency Use Authorization (EUA). This EUA will remain in effect (meaning this test can be used) for the duration of the COVID-19 declaration under Section 564(b)(1) of the Act, 21 U.S.C. section 360bbb-3(b)(1), unless the authorization is terminated or revoked.  Performed at [facility], [address], [place], [place] [address]          Radiology Studies: No results found.      Scheduled Meds:  vitamin C  500 mg Oral BID   buPROPion  100 mg Oral Q1200   busPIRone  5 mg Oral Daily   cloNIDine  0.1 mg Oral BID   docusate sodium  200 mg Oral BID   enoxaparin (LOVENOX) injection  40 mg Subcutaneous Q24H   feeding supplement  237 mL Oral TID BM   insulin aspart  0-15 Units Subcutaneous  TID WC   insulin aspart  0-5 Units Subcutaneous QHS   metoprolol tartrate  50 mg Oral BID   multivitamin with minerals  1 tablet Oral Daily   zinc sulfate  220 mg Oral Daily   Continuous Infusions:     LOS: 3 days    Time spent: 26 mins     Wyvonnia Dusky, MD [facility] Pager [phone]  If 7PM-7AM, please contact night-coverage 12/22/2021, 7:40 AM

## 2021-12-22 NOTE — Care Management Important Message (Signed)
Important Message  Patient Details  Name: Heather Lopez MRN: [id number] Date of Birth: [birth year]   Medicare Important Message Given:  Yes     [name] 12/22/2021, 11:02 AM

## 2021-12-23 LAB — BASIC METABOLIC PANEL
Anion gap: 8 (ref 5–15)
BUN: 24 mg/dL — ABNORMAL HIGH (ref 8–23)
CO2: 22 mmol/L (ref 22–32)
Calcium: 9.2 mg/dL (ref 8.9–10.3)
Chloride: 108 mmol/L (ref 98–111)
Creatinine, Ser: 1.46 mg/dL — ABNORMAL HIGH (ref 0.44–1.00)
GFR, Estimated: 38 mL/min — ABNORMAL LOW (ref >60)
Glucose, Bld: 192 mg/dL — ABNORMAL HIGH (ref 70–99)
Potassium: 4.1 mmol/L (ref 3.5–5.1)
Sodium: 138 mmol/L (ref 135–145)

## 2021-12-23 LAB — CBC
HCT: 31.1 % — ABNORMAL LOW (ref 36.0–46.0)
Hemoglobin: 9.5 g/dL — ABNORMAL LOW (ref 12.0–15.0)
MCH: 25.5 pg — ABNORMAL LOW (ref 26.0–34.0)
MCHC: 30.5 g/dL (ref 30.0–36.0)
MCV: 83.6 fL (ref 80.0–100.0)
Platelets: 319 10*3/uL (ref 150–400)
RBC: 3.72 MIL/uL — ABNORMAL LOW (ref 3.87–5.11)
RDW: 17.2 % — ABNORMAL HIGH (ref 11.5–15.5)
WBC: 6.8 10*3/uL (ref 4.0–10.5)
nRBC: 0 % (ref 0.0–0.2)

## 2021-12-23 LAB — URINE CULTURE

## 2021-12-23 LAB — GLUCOSE, CAPILLARY
Glucose-Capillary: 199 mg/dL — ABNORMAL HIGH (ref 70–99)
Glucose-Capillary: 174 mg/dL — ABNORMAL HIGH (ref 70–99)
Glucose-Capillary: 160 mg/dL — ABNORMAL HIGH (ref 70–99)
Glucose-Capillary: 177 mg/dL — ABNORMAL HIGH (ref 70–99)

## 2021-12-23 MED ORDER — POLYETHYLENE GLYCOL 3350 17 G PO PACK
17.0000 g | PACK | Freq: Every day | ORAL | Status: DC
  Administered 2021-12-23 – 2021-12-24 (×2): 17 g via ORAL
  Filled 2021-12-23 (×7): qty 1

## 2021-12-23 NOTE — Progress Notes (Signed)
PROGRESS NOTE    Heather Lopez  YTK:[id number] DOB: 1945/12/28 DOA: 12/19/2021 PCP: [name], PA-C    Assessment & Plan:   Principal Problem:   AKI (acute kidney injury) ([place]) Active Problems:   Depression   Diabetes mellitus, type II ([place])   Acute kidney injury superimposed on CKD ([place])   Bilateral nephrolithiasis   Acute on chronic anemia   Essential hypertension   Chronic osteomyelitis of coccyx ([place])   Decubitus ulcer of sacral region, stage 4 (HCC)   Lactic acidosis   Status post placement of ureteral stent 12/09/2021   AKI on CKDIIIb: Cr is trending down from day prior. Avoid nephrotoxic meds    ACD: likely secondary to chronic blood loss from wound VAC. H&H are trending up today   Lactic acidosis: resolved. No sepsis currently   B/l nephrolithiasis: s/p ureteral stent 12/09/2021. Urine cx shows insignificant growth.  Repeat urine cx is pending    Decubitus ulcer of sacral region/ chronic osteomyelitis of coccyx: stage IV. Continue w/ wound vac. Buttocks pain slightly improved today   Depression: severity unknown. Continue on home dose of bupropion, buspirone   DM2: HbA1c 5.9, well controlled. Continue on SSI w/ accuchecks   HTN: continue on home dose of metoprolol, clonidine    Obesity: BMI 32.9. Complicates overall care & prognosis   DVT prophylaxis: lovenox  Code Status: DNR Family Communication:  Disposition Plan: d/c back home w/ HH   Level of care: Med-Surg  Status is: Inpatient  Remains inpatient appropriate because: severity of illness     Consultants:    Procedures:   Antimicrobials:    Subjective: Pt c/o buttocks pain, slightly improved from day prior   Objective: Vitals:   12/22/21 1525 12/22/21 2012 12/23/21 0441 12/23/21 0500  BP: (!) 162/48 (!) 159/57 (!) 164/53   Pulse: 66 66 63   Resp: 20 20 20    Temp: 97.8 F (36.6 C) 98.2 F (36.8 C) 97.6 F (36.4 C)   TempSrc: Oral Oral Oral   SpO2: 97% 97% 95%   Weight:    86.3  kg  Height:        Intake/Output Summary (Last 24 hours) at 12/23/2021 0745 Last data filed at 12/23/2021 0500 Gross per 24 hour  Intake 240 ml  Output 1200 ml  Net -960 ml   Filed Weights   12/21/21 0444 12/22/21 0500 12/23/21 0500  Weight: 86.1 kg 84.3 kg 86.3 kg    Examination:  General exam: appears calm but uncomfortable  Respiratory system: clear breath sounds b/l  Cardiovascular system: S1/S2+. No rubs or clicks  Gastrointestinal system: Abd is soft, NT, obese & normal bowel sounds  Central nervous system: alert and oriented. Moves all extremities  Psychiatry: judgement and insight appears normal. Flat mood and affect    Data Reviewed: I have personally reviewed following labs and imaging studies  CBC: Recent Labs  Lab 12/19/21 1411 12/20/21 0432 12/21/21 0425 12/22/21 0425 12/23/21 0544  WBC 9.8 8.2 6.6 6.2 6.8  NEUTROABS 7.0  --   --   --   --   HGB 9.7* 9.0* 9.2* 9.1* 9.5*  HCT 31.7* 29.4* 30.6* 29.9* 31.1*  MCV 82.6 83.3 84.3 84.9 83.6  PLT 376 316 315 326 751   Basic Metabolic Panel: Recent Labs  Lab 12/19/21 2010 12/20/21 0432 12/21/21 0425 12/22/21 0425 12/23/21 0544  NA 137 139 137 138 138  K 3.6 3.5 3.9 4.1 4.1  CL 107 111 109 111 108  CO2  22 20* 21* 22 22  GLUCOSE 70 83 154* 167* 192*  BUN 28* 26* 23 24* 24*  CREATININE 2.35* 1.94* 1.52* 1.55* 1.46*  CALCIUM 9.0 8.5* 8.8* 9.0 9.2   GFR: Estimated Creatinine Clearance: 35.2 mL/min (A) (by C-G formula based on SCr of 1.46 mg/dL (H)). Liver Function Tests: Recent Labs  Lab 12/19/21 1411  AST 20  ALT 25  ALKPHOS 106  BILITOT 0.3  PROT 6.4*  ALBUMIN 2.8*   No results for input(s): LIPASE, AMYLASE in the last 168 hours. No results for input(s): AMMONIA in the last 168 hours. Coagulation Profile: No results for input(s): INR, PROTIME in the last 168 hours. Cardiac Enzymes: No results for input(s): CKTOTAL, CKMB, CKMBINDEX, TROPONINI in the last 168 hours. BNP (last 3 results) No  results for input(s): PROBNP in the last 8760 hours. HbA1C: No results for input(s): HGBA1C in the last 72 hours. CBG: Recent Labs  Lab 12/22/21 0745 12/22/21 0810 12/22/21 1112 12/22/21 1700 12/22/21 2131  GLUCAP 183* 166* 103* 180* 227*   Lipid Profile: No results for input(s): CHOL, HDL, LDLCALC, TRIG, CHOLHDL, LDLDIRECT in the last 72 hours. Thyroid Function Tests: No results for input(s): TSH, T4TOTAL, FREET4, T3FREE, THYROIDAB in the last 72 hours. Anemia Panel: No results for input(s): VITAMINB12, FOLATE, FERRITIN, TIBC, IRON, RETICCTPCT in the last 72 hours. Sepsis Labs: Recent Labs  Lab 12/19/21 1411 12/19/21 2010  LATICACIDVEN 2.0* 0.9    Recent Results (from the past 240 hour(s))  Resp Panel by RT-PCR (Flu A&B, Covid) Nasopharyngeal Swab     Status: None   Collection Time: 12/19/21 11:00 PM   Specimen: Nasopharyngeal Swab; Nasopharyngeal(NP) swabs in vial transport medium  Result Value Ref Range Status   SARS Coronavirus 2 by RT PCR NEGATIVE NEGATIVE Final    Comment: (NOTE) SARS-CoV-2 target nucleic acids are NOT DETECTED.  The SARS-CoV-2 RNA is generally detectable in upper respiratory specimens during the acute phase of infection. The lowest concentration of SARS-CoV-2 viral copies this assay can detect is 138 copies/mL. A negative result does not preclude SARS-Cov-2 infection and should not be used as the sole basis for treatment or other patient management decisions. A negative result may occur with  improper specimen collection/handling, submission of specimen other than nasopharyngeal swab, presence of viral mutation(s) within the areas targeted by this assay, and inadequate number of viral copies(<138 copies/mL). A negative result must be combined with clinical observations, patient history, and epidemiological information. The expected result is Negative.  Fact Sheet for Patients:  EntrepreneurPulse.com.au  Fact Sheet for  Healthcare Providers:  IncredibleEmployment.be  This test is no t yet approved or cleared by the [place] FDA and  has been authorized for detection and/or diagnosis of SARS-CoV-2 by FDA under an Emergency Use Authorization (EUA). This EUA will remain  in effect (meaning this test can be used) for the duration of the COVID-19 declaration under Section 564(b)(1) of the Act, 21 U.S.C.section 360bbb-3(b)(1), unless the authorization is terminated  or revoked sooner.       Influenza A by PCR NEGATIVE NEGATIVE Final   Influenza B by PCR NEGATIVE NEGATIVE Final    Comment: (NOTE) The Xpert Xpress SARS-CoV-2/FLU/RSV plus assay is intended as an aid in the diagnosis of influenza from Nasopharyngeal swab specimens and should not be used as a sole basis for treatment. Nasal washings and aspirates are unacceptable for Xpert Xpress SARS-CoV-2/FLU/RSV testing.  Fact Sheet for Patients: EntrepreneurPulse.com.au  Fact Sheet for Healthcare Providers: IncredibleEmployment.be  This test  is not yet approved or cleared by the [place] and has been authorized for detection and/or diagnosis of SARS-CoV-2 by FDA under an Emergency Use Authorization (EUA). This EUA will remain in effect (meaning this test can be used) for the duration of the COVID-19 declaration under Section 564(b)(1) of the Act, 21 U.S.C. section 360bbb-3(b)(1), unless the authorization is terminated or revoked.  Performed at [facility], [address], [place], [place] [address]   Urine Culture     Status: Abnormal   Collection Time: 12/20/21 12:30 AM   Specimen: Urine, Random  Result Value Ref Range Status   Specimen Description   Final    URINE, RANDOM Performed at [facility], [address], [place], [place] [address]    Special Requests   Final    NONE Performed at [facility] [address] [place], [place]  [address]    Culture (A)  Final    <10,000 COLONIES/mL INSIGNIFICANT GROWTH Performed at [facility], [place] [address], [place], [place] [address]    Report Status 12/22/2021 FINAL  Final         Radiology Studies: No results found.      Scheduled Meds:  vitamin C  500 mg Oral BID   buPROPion  100 mg Oral Q1200   busPIRone  5 mg Oral Daily   cloNIDine  0.1 mg Oral BID   docusate sodium  200 mg Oral BID   enoxaparin (LOVENOX) injection  40 mg Subcutaneous Q24H   feeding supplement  237 mL Oral TID BM   insulin aspart  0-15 Units Subcutaneous TID WC   insulin aspart  0-5 Units Subcutaneous QHS   metoprolol tartrate  50 mg Oral BID   multivitamin with minerals  1 tablet Oral Daily   zinc sulfate  220 mg Oral Daily   Continuous Infusions:     LOS: 4 days    Time spent: 15 mins     Wyvonnia Dusky, MD [facility] Pager [phone]  If 7PM-7AM, please contact night-coverage 12/23/2021, 7:45 AM

## 2021-12-23 NOTE — Plan of Care (Signed)
  Problem: Pain Managment: Goal: General experience of comfort will improve Outcome: Progressing   Problem: Safety: Goal: Ability to remain free from injury will improve Outcome: Progressing   Problem: Skin Integrity: Goal: Risk for impaired skin integrity will decrease Outcome: Progressing   

## 2021-12-24 DIAGNOSIS — K5900 Constipation, unspecified: Secondary | ICD-10-CM

## 2021-12-24 LAB — CBC
HCT: 31.9 % — ABNORMAL LOW (ref 36.0–46.0)
Hemoglobin: 9.8 g/dL — ABNORMAL LOW (ref 12.0–15.0)
MCH: 25.8 pg — ABNORMAL LOW (ref 26.0–34.0)
MCHC: 30.7 g/dL (ref 30.0–36.0)
MCV: 83.9 fL (ref 80.0–100.0)
Platelets: 310 10*3/uL (ref 150–400)
RBC: 3.8 MIL/uL — ABNORMAL LOW (ref 3.87–5.11)
RDW: 17.2 % — ABNORMAL HIGH (ref 11.5–15.5)
WBC: 7 10*3/uL (ref 4.0–10.5)
nRBC: 0 % (ref 0.0–0.2)

## 2021-12-24 LAB — GLUCOSE, CAPILLARY
Glucose-Capillary: 188 mg/dL — ABNORMAL HIGH (ref 70–99)
Glucose-Capillary: 226 mg/dL — ABNORMAL HIGH (ref 70–99)
Glucose-Capillary: 213 mg/dL — ABNORMAL HIGH (ref 70–99)
Glucose-Capillary: 221 mg/dL — ABNORMAL HIGH (ref 70–99)

## 2021-12-24 LAB — BASIC METABOLIC PANEL
Anion gap: 10 (ref 5–15)
BUN: 23 mg/dL (ref 8–23)
CO2: 20 mmol/L — ABNORMAL LOW (ref 22–32)
Calcium: 9.3 mg/dL (ref 8.9–10.3)
Chloride: 104 mmol/L (ref 98–111)
Creatinine, Ser: 1.51 mg/dL — ABNORMAL HIGH (ref 0.44–1.00)
GFR, Estimated: 36 mL/min — ABNORMAL LOW (ref >60)
Glucose, Bld: 175 mg/dL — ABNORMAL HIGH (ref 70–99)
Potassium: 4.4 mmol/L (ref 3.5–5.1)
Sodium: 134 mmol/L — ABNORMAL LOW (ref 135–145)

## 2021-12-24 MED ORDER — LACTULOSE 10 GM/15ML PO SOLN
30.0000 g | Freq: Two times a day (BID) | ORAL | Status: DC
  Administered 2021-12-24: 30 g via ORAL
  Filled 2021-12-24: qty 60

## 2021-12-24 NOTE — Progress Notes (Signed)
PROGRESS NOTE    Heather Lopez  IEP:[id number] DOB: [birth year] DOA: 12/19/2021 PCP: [name], PA-C    Assessment & Plan:   Principal Problem:   AKI (acute kidney injury) ([place]) Active Problems:   Depression   Diabetes mellitus, type II ([place])   Acute kidney injury superimposed on CKD ([place])   Bilateral nephrolithiasis   Acute on chronic anemia   Essential hypertension   Chronic osteomyelitis of coccyx ([place])   Decubitus ulcer of sacral region, stage 4 (HCC)   Lactic acidosis   Status post placement of ureteral stent 12/09/2021   AKI on CKDIIIb: Cr is labile. Avoid nephrotoxic meds   ACD: likely secondary to chronic blood loss from wound VAC. H&H are labile   Lactic acidosis: resolved. No sepsis currently   B/l nephrolithiasis: s/p ureteral stent 12/09/2021. Urine cx shows insignificant growth. Repeat urine cx shows containment    Decubitus ulcer of sacral region/ chronic osteomyelitis of coccyx: stage IV. Continue w/ wound vac. Buttocks pain slightly improved today   Constipation: continue on miralax and started on lactulose   Depression: severity unknown. Continue on home dose of buspirone, bupropion   DM2: well controlled, HbA1c 5.9. Continue on SSI w/ accuchecks   HTN: continue on clonidine, metoprolol   Obesity: BMI 32.9. Complicates overall care & prognosis   DVT prophylaxis: lovenox  Code Status: DNR Family Communication:  Disposition Plan: d/c back home w/ HH   Level of care: Med-Surg  Status is: Inpatient  Remains inpatient appropriate because: severity of illness     Consultants:    Procedures:   Antimicrobials:    Subjective: Pt c/o constipation  Objective: Vitals:   12/23/21 1621 12/23/21 2042 12/24/21 0432 12/24/21 0447  BP: (!) 155/68 (!) 161/56  (!) 154/63  Pulse: 62 66  62  Resp: 16 18  20   Temp: 97.9 F (36.6 C) 98.4 F (36.9 C)  98.3 F (36.8 C)  TempSrc:  Oral  Oral  SpO2: 98% 97%  96%  Weight:   87.8 kg   Height:         Intake/Output Summary (Last 24 hours) at 12/24/2021 0753 Last data filed at 12/23/2021 2359 Gross per 24 hour  Intake 480 ml  Output 700 ml  Net -220 ml   Filed Weights   12/22/21 0500 12/23/21 0500 12/24/21 0432  Weight: 84.3 kg 86.3 kg 87.8 kg    Examination:  General exam: appears uncomfortable  Respiratory system: clear breath sounds b/l  Cardiovascular system: S1 & S2+. No rubs or clicks  Gastrointestinal system: Abd is soft, NT, obese & hypoactive bowel sounds  Central nervous system: alert and oriented. Moves all extremities  Psychiatry: judgement and insight appears normal. Flat mood and affect     Data Reviewed: I have personally reviewed following labs and imaging studies  CBC: Recent Labs  Lab 12/19/21 1411 12/20/21 0432 12/21/21 0425 12/22/21 0425 12/23/21 0544 12/24/21 0419  WBC 9.8 8.2 6.6 6.2 6.8 7.0  NEUTROABS 7.0  --   --   --   --   --   HGB 9.7* 9.0* 9.2* 9.1* 9.5* 9.8*  HCT 31.7* 29.4* 30.6* 29.9* 31.1* 31.9*  MCV 82.6 83.3 84.3 84.9 83.6 83.9  PLT 376 316 315 326 319 660   Basic Metabolic Panel: Recent Labs  Lab 12/20/21 0432 12/21/21 0425 12/22/21 0425 12/23/21 0544 12/24/21 0419  NA 139 137 138 138 134*  K 3.5 3.9 4.1 4.1 4.4  CL 111 109 111 108 104  CO2 20* 21* 22 22 20*  GLUCOSE 83 154* 167* 192* 175*  BUN 26* 23 24* 24* 23  CREATININE 1.94* 1.52* 1.55* 1.46* 1.51*  CALCIUM 8.5* 8.8* 9.0 9.2 9.3   GFR: Estimated Creatinine Clearance: 34.4 mL/min (A) (by C-G formula based on SCr of 1.51 mg/dL (H)). Liver Function Tests: Recent Labs  Lab 12/19/21 1411  AST 20  ALT 25  ALKPHOS 106  BILITOT 0.3  PROT 6.4*  ALBUMIN 2.8*   No results for input(s): LIPASE, AMYLASE in the last 168 hours. No results for input(s): AMMONIA in the last 168 hours. Coagulation Profile: No results for input(s): INR, PROTIME in the last 168 hours. Cardiac Enzymes: No results for input(s): CKTOTAL, CKMB, CKMBINDEX, TROPONINI in the last 168  hours. BNP (last 3 results) No results for input(s): PROBNP in the last 8760 hours. HbA1C: No results for input(s): HGBA1C in the last 72 hours. CBG: Recent Labs  Lab 12/23/21 0826 12/23/21 1153 12/23/21 1621 12/23/21 2202 12/24/21 0729  GLUCAP 199* 174* 160* 177* 188*   Lipid Profile: No results for input(s): CHOL, HDL, LDLCALC, TRIG, CHOLHDL, LDLDIRECT in the last 72 hours. Thyroid Function Tests: No results for input(s): TSH, T4TOTAL, FREET4, T3FREE, THYROIDAB in the last 72 hours. Anemia Panel: No results for input(s): VITAMINB12, FOLATE, FERRITIN, TIBC, IRON, RETICCTPCT in the last 72 hours. Sepsis Labs: Recent Labs  Lab 12/19/21 1411 12/19/21 2010  LATICACIDVEN 2.0* 0.9    Recent Results (from the past 240 hour(s))  Resp Panel by RT-PCR (Flu A&B, Covid) Nasopharyngeal Swab     Status: None   Collection Time: 12/19/21 11:00 PM   Specimen: Nasopharyngeal Swab; Nasopharyngeal(NP) swabs in vial transport medium  Result Value Ref Range Status   SARS Coronavirus 2 by RT PCR NEGATIVE NEGATIVE Final    Comment: (NOTE) SARS-CoV-2 target nucleic acids are NOT DETECTED.  The SARS-CoV-2 RNA is generally detectable in upper respiratory specimens during the acute phase of infection. The lowest concentration of SARS-CoV-2 viral copies this assay can detect is 138 copies/mL. A negative result does not preclude SARS-Cov-2 infection and should not be used as the sole basis for treatment or other patient management decisions. A negative result may occur with  improper specimen collection/handling, submission of specimen other than nasopharyngeal swab, presence of viral mutation(s) within the areas targeted by this assay, and inadequate number of viral copies(<138 copies/mL). A negative result must be combined with clinical observations, patient history, and epidemiological information. The expected result is Negative.  Fact Sheet for Patients:   EntrepreneurPulse.com.au  Fact Sheet for Healthcare Providers:  IncredibleEmployment.be  This test is no t yet approved or cleared by the [place] FDA and  has been authorized for detection and/or diagnosis of SARS-CoV-2 by FDA under an Emergency Use Authorization (EUA). This EUA will remain  in effect (meaning this test can be used) for the duration of the COVID-19 declaration under Section 564(b)(1) of the Act, 21 U.S.C.section 360bbb-3(b)(1), unless the authorization is terminated  or revoked sooner.       Influenza A by PCR NEGATIVE NEGATIVE Final   Influenza B by PCR NEGATIVE NEGATIVE Final    Comment: (NOTE) The Xpert Xpress SARS-CoV-2/FLU/RSV plus assay is intended as an aid in the diagnosis of influenza from Nasopharyngeal swab specimens and should not be used as a sole basis for treatment. Nasal washings and aspirates are unacceptable for Xpert Xpress SARS-CoV-2/FLU/RSV testing.  Fact Sheet for Patients: EntrepreneurPulse.com.au  Fact Sheet for Healthcare Providers: IncredibleEmployment.be  This  test is not yet approved or cleared by the [place] and has been authorized for detection and/or diagnosis of SARS-CoV-2 by FDA under an Emergency Use Authorization (EUA). This EUA will remain in effect (meaning this test can be used) for the duration of the COVID-19 declaration under Section 564(b)(1) of the Act, 21 U.S.C. section 360bbb-3(b)(1), unless the authorization is terminated or revoked.  Performed at [facility], [address] [place], [place] [address]   Urine Culture     Status: Abnormal   Collection Time: 12/20/21 12:30 AM   Specimen: Urine, Random  Result Value Ref Range Status   Specimen Description   Final    URINE, RANDOM Performed at [facility], [address], [place], [place] [address]    Special Requests   Final    NONE Performed at  [facility], [address] [place], [place] [address]    Culture (A)  Final    <10,000 COLONIES/mL INSIGNIFICANT GROWTH Performed at [facility], [place] [address] [place], [place] [address]    Report Status 12/22/2021 FINAL  Final  Urine Culture     Status: Abnormal   Collection Time: 12/22/21  2:20 PM   Specimen: Urine, Clean Catch  Result Value Ref Range Status   Specimen Description   Final    URINE, CLEAN CATCH Performed at [facility], [address], [place], [place] [address]    Special Requests   Final    NONE Performed at [facility], [address] [place],  [address]    Culture MULTIPLE SPECIES PRESENT, SUGGEST RECOLLECTION (A)  Final   Report Status 12/23/2021 FINAL  Final         Radiology Studies: No results found.      Scheduled Meds:  vitamin C  500 mg Oral BID   buPROPion  100 mg Oral Q1200   busPIRone  5 mg Oral Daily   cloNIDine  0.1 mg Oral BID   docusate sodium  200 mg Oral BID   enoxaparin (LOVENOX) injection  40 mg Subcutaneous Q24H   feeding supplement  237 mL Oral TID BM   insulin aspart  0-15 Units Subcutaneous TID WC   insulin aspart  0-5 Units Subcutaneous QHS   metoprolol tartrate  50 mg Oral BID   multivitamin with minerals  1 tablet Oral Daily   polyethylene glycol  17 g Oral Daily   zinc sulfate  220 mg Oral Daily   Continuous Infusions:     LOS: 5 days    Time spent: 20 mins     Wyvonnia Dusky, MD [facility] Pager [phone]  If 7PM-7AM, please contact night-coverage 12/24/2021, 7:53 AM

## 2021-12-25 ENCOUNTER — Inpatient Hospital Stay: Payer: Medicare Other

## 2021-12-25 DIAGNOSIS — F3289 Other specified depressive episodes: Secondary | ICD-10-CM

## 2021-12-25 LAB — CBC
HCT: 31.4 % — ABNORMAL LOW (ref 36.0–46.0)
Hemoglobin: 9.6 g/dL — ABNORMAL LOW (ref 12.0–15.0)
MCH: 25.1 pg — ABNORMAL LOW (ref 26.0–34.0)
MCHC: 30.6 g/dL (ref 30.0–36.0)
MCV: 82.2 fL (ref 80.0–100.0)
Platelets: 289 10*3/uL (ref 150–400)
RBC: 3.82 MIL/uL — ABNORMAL LOW (ref 3.87–5.11)
RDW: 17.6 % — ABNORMAL HIGH (ref 11.5–15.5)
WBC: 5.7 10*3/uL (ref 4.0–10.5)
nRBC: 0 % (ref 0.0–0.2)

## 2021-12-25 LAB — GLUCOSE, CAPILLARY
Glucose-Capillary: 203 mg/dL — ABNORMAL HIGH (ref 70–99)
Glucose-Capillary: 200 mg/dL — ABNORMAL HIGH (ref 70–99)
Glucose-Capillary: 235 mg/dL — ABNORMAL HIGH (ref 70–99)
Glucose-Capillary: 284 mg/dL — ABNORMAL HIGH (ref 70–99)

## 2021-12-25 LAB — BASIC METABOLIC PANEL
Anion gap: 6 (ref 5–15)
BUN: 27 mg/dL — ABNORMAL HIGH (ref 8–23)
CO2: 21 mmol/L — ABNORMAL LOW (ref 22–32)
Calcium: 8.9 mg/dL (ref 8.9–10.3)
Chloride: 107 mmol/L (ref 98–111)
Creatinine, Ser: 1.95 mg/dL — ABNORMAL HIGH (ref 0.44–1.00)
GFR, Estimated: 27 mL/min — ABNORMAL LOW (ref >60)
Glucose, Bld: 207 mg/dL — ABNORMAL HIGH (ref 70–99)
Potassium: 4.2 mmol/L (ref 3.5–5.1)
Sodium: 134 mmol/L — ABNORMAL LOW (ref 135–145)

## 2021-12-25 MED ORDER — FUROSEMIDE 10 MG/ML IJ SOLN
40.0000 mg | Freq: Once | INTRAMUSCULAR | Status: AC
  Administered 2021-12-25: 40 mg via INTRAVENOUS
  Filled 2021-12-25: qty 4

## 2021-12-25 NOTE — Care Management Important Message (Signed)
Important Message  Patient Details  Name: Heather Lopez MRN: [id number] Date of Birth: Nov 14, 1946   Medicare Important Message Given:  Yes     [name] 12/25/2021, 11:35 AM

## 2021-12-25 NOTE — Consult Note (Signed)
[place] Nurse wound follow up Patient receiving care in [facility]. Patient premedicated with IV pain med prior to [facility] dressing change. Wound type: stage 4 PI to sacrum Measurement: 2.8 cm x 2.8 cm x 0.8 cm Wound bed: pink Drainage (amount, consistency, odor) nothing in VAC cannister Periwound: intact Dressing procedure/placement/frequency: one piece of black foam removed. One piece of black foam placed into wound. Wound surrounded by barrier ring. Connected to [facility], immediate seal obtained. Patient tolerated well. Val Riles, RN, MSN, CWOCN, CNS-BC, pager [phone]

## 2021-12-25 NOTE — TOC Progression Note (Signed)
Transition of Care [facility]) - Progression Note    Patient Details  Name: BIONCA MCKEY MRN: [id number] Date of Birth: May 09, 1946  [facility] CM/SW Contact  Beverly Sessions, RN Phone Number: 12/25/2021, 2:22 PM  Clinical Narrative:     Barrier:  pain control       Expected Discharge Plan and Services                                                 Social Determinants of Health (SDOH) Interventions    Readmission Risk Interventions Readmission Risk Prevention Plan 12/20/2021 12/03/2021  Transportation Screening Complete Complete  HRI or Home Care Consult Complete -  Social Work Consult for [place] Planning/Counseling Complete -  Palliative Care Screening Complete -  Medication Review Press photographer) Complete Complete  PCP or Specialist appointment within 3-5 days of discharge - Complete  SW Recovery Care/Counseling Consult - Complete  [place] - Complete  Some recent data might be hidden

## 2021-12-25 NOTE — Progress Notes (Signed)
Inpatient Diabetes Program Recommendations  AACE/ADA: New Consensus Statement on Inpatient Glycemic Control   Target Ranges:  Prepandial:   less than 140 mg/dL      Peak postprandial:   less than 180 mg/dL (1-2 hours)      Critically ill patients:  140 - 180 mg/dL    Latest Reference Range & Units 12/24/21 07:29 12/24/21 12:31 12/24/21 16:39 12/24/21 21:15 12/25/21 07:21  Glucose-Capillary 70 - 99 mg/dL 188 (H) 226 (H) 213 (H) 221 (H) 203 (H)   Review of Glycemic Control  Diabetes history: DM2 Outpatient Diabetes medications: Levemir 30 units BID, Novolog 0-10 units TID with meals, Januvia 25 mg daily Current orders for Inpatient glycemic control: Novolog 0-15 units TID with meals, Novolog 0-5 units QHS  Inpatient Diabetes Program Recommendations:    Insulin: Please consider ordering Levemir 8 units Q24H and Novolog 3 units TID with meals for meal coverage if patient eats at least 50% of meals.  Thanks, Barnie Alderman, RN, MSN, CDE Diabetes Coordinator Inpatient Diabetes Program [phone] (Team Pager from 8am to 5pm)

## 2021-12-25 NOTE — Progress Notes (Signed)
PROGRESS NOTE    Heather Lopez  GXQ:[id number] DOB: Sep 23, 1946 DOA: 12/19/2021 PCP: [name], PA-C    Assessment & Plan:   Principal Problem:   AKI (acute kidney injury) ([place]) Active Problems:   Depression   Diabetes mellitus, type II ([place])   Acute kidney injury superimposed on CKD ([place])   Bilateral nephrolithiasis   Acute on chronic anemia   Essential hypertension   Chronic osteomyelitis of coccyx ([place])   Decubitus ulcer of sacral region, stage 4 (HCC)   Lactic acidosis   Status post placement of ureteral stent 12/09/2021   AKI on CKDIIIb: Cr is labile. Will continue to monitor    ACD: likely secondary to chronic blood loss from wound VAC. H&H are stable   Lactic acidosis: resolved. No sepsis currently   B/l nephrolithiasis: s/p ureteral stent 12/09/2021. Urine cx shows insignificant growth. Repeat urine cx shows containment    Decubitus ulcer of sacral region/ chronic osteomyelitis of coccyx: stage IV.  Continue w/ wound vac. Morphine, norco prn for pain   Constipation: resolved   Depression: severity unknown. Continue on home dose of bupropion, buspirone   DM2: HbA1c 5.9, well controlled. Continue on SSI w/ accuchecks   HTN: continue on metoprolol, clonidine   Obesity: BMI 32.9. Complicates overall care & prognosis   DVT prophylaxis: lovenox  Code Status: DNR Family Communication: discussed pt's care w/ pt's husband at bedside and answered his questions  Disposition Plan: d/c back home w/ HH   Level of care: Med-Surg  Status is: Inpatient  Remains inpatient appropriate because: severity of illness     Consultants:    Procedures:   Antimicrobials:    Subjective: Pt c/o buttocks pain   Objective: Vitals:   12/24/21 1625 12/24/21 2001 12/25/21 0528 12/25/21 0733  BP: 140/66 (!) 140/53 (!) 145/60 (!) 132/50  Pulse: 69 79 67 61  Resp: 18 20 18 18   Temp: 98.7 F (37.1 C) 99 F (37.2 C) 98.9 F (37.2 C) 97.9 F (36.6 C)  TempSrc: Oral Oral   Oral  SpO2: 95% 99% 97% 99%  Weight:      Height:        Intake/Output Summary (Last 24 hours) at 12/25/2021 0742 Last data filed at 12/24/2021 1914 Gross per 24 hour  Intake 240 ml  Output 700 ml  Net -460 ml   Filed Weights   12/22/21 0500 12/23/21 0500 12/24/21 0432  Weight: 84.3 kg 86.3 kg 87.8 kg    Examination:  General exam: appears emotional & uncomfortable  Respiratory system: clear breath sounds b/l. No rhonchi  Cardiovascular system: S1/S2+. No rubs or gallops   Gastrointestinal system: abd is soft, NT, obese & normal bowel sounds  Central nervous system: alert and oriented. Moves all extremities  Psychiatry: judgement and insight appears normal. Emotional mood and affect    Data Reviewed: I have personally reviewed following labs and imaging studies  CBC: Recent Labs  Lab 12/19/21 1411 12/20/21 0432 12/21/21 0425 12/22/21 0425 12/23/21 0544 12/24/21 0419 12/25/21 0434  WBC 9.8   < > 6.6 6.2 6.8 7.0 5.7  NEUTROABS 7.0  --   --   --   --   --   --   HGB 9.7*   < > 9.2* 9.1* 9.5* 9.8* 9.6*  HCT 31.7*   < > 30.6* 29.9* 31.1* 31.9* 31.4*  MCV 82.6   < > 84.3 84.9 83.6 83.9 82.2  PLT 376   < > 315 326 319 310 289   < > =  values in this interval not displayed.   Basic Metabolic Panel: Recent Labs  Lab 12/21/21 0425 12/22/21 0425 12/23/21 0544 12/24/21 0419 12/25/21 0434  NA 137 138 138 134* 134*  K 3.9 4.1 4.1 4.4 4.2  CL 109 111 108 104 107  CO2 21* 22 22 20* 21*  GLUCOSE 154* 167* 192* 175* 207*  BUN 23 24* 24* 23 27*  CREATININE 1.52* 1.55* 1.46* 1.51* 1.95*  CALCIUM 8.8* 9.0 9.2 9.3 8.9   GFR: Estimated Creatinine Clearance: 26.6 mL/min (A) (by C-G formula based on SCr of 1.95 mg/dL (H)). Liver Function Tests: Recent Labs  Lab 12/19/21 1411  AST 20  ALT 25  ALKPHOS 106  BILITOT 0.3  PROT 6.4*  ALBUMIN 2.8*   No results for input(s): LIPASE, AMYLASE in the last 168 hours. No results for input(s): AMMONIA in the last 168  hours. Coagulation Profile: No results for input(s): INR, PROTIME in the last 168 hours. Cardiac Enzymes: No results for input(s): CKTOTAL, CKMB, CKMBINDEX, TROPONINI in the last 168 hours. BNP (last 3 results) No results for input(s): PROBNP in the last 8760 hours. HbA1C: No results for input(s): HGBA1C in the last 72 hours. CBG: Recent Labs  Lab 12/24/21 0729 12/24/21 1231 12/24/21 1639 12/24/21 2115 12/25/21 0721  GLUCAP 188* 226* 213* 221* 203*   Lipid Profile: No results for input(s): CHOL, HDL, LDLCALC, TRIG, CHOLHDL, LDLDIRECT in the last 72 hours. Thyroid Function Tests: No results for input(s): TSH, T4TOTAL, FREET4, T3FREE, THYROIDAB in the last 72 hours. Anemia Panel: No results for input(s): VITAMINB12, FOLATE, FERRITIN, TIBC, IRON, RETICCTPCT in the last 72 hours. Sepsis Labs: Recent Labs  Lab 12/19/21 1411 12/19/21 2010  LATICACIDVEN 2.0* 0.9    Recent Results (from the past 240 hour(s))  Resp Panel by RT-PCR (Flu A&B, Covid) Nasopharyngeal Swab     Status: None   Collection Time: 12/19/21 11:00 PM   Specimen: Nasopharyngeal Swab; Nasopharyngeal(NP) swabs in vial transport medium  Result Value Ref Range Status   SARS Coronavirus 2 by RT PCR NEGATIVE NEGATIVE Final    Comment: (NOTE) SARS-CoV-2 target nucleic acids are NOT DETECTED.  The SARS-CoV-2 RNA is generally detectable in upper respiratory specimens during the acute phase of infection. The lowest concentration of SARS-CoV-2 viral copies this assay can detect is 138 copies/mL. A negative result does not preclude SARS-Cov-2 infection and should not be used as the sole basis for treatment or other patient management decisions. A negative result may occur with  improper specimen collection/handling, submission of specimen other than nasopharyngeal swab, presence of viral mutation(s) within the areas targeted by this assay, and inadequate number of viral copies(<138 copies/mL). A negative result must  be combined with clinical observations, patient history, and epidemiological information. The expected result is Negative.  Fact Sheet for Patients:  EntrepreneurPulse.com.au  Fact Sheet for Healthcare Providers:  IncredibleEmployment.be  This test is no t yet approved or cleared by the [place] FDA and  has been authorized for detection and/or diagnosis of SARS-CoV-2 by FDA under an Emergency Use Authorization (EUA). This EUA will remain  in effect (meaning this test can be used) for the duration of the COVID-19 declaration under Section 564(b)(1) of the Act, 21 U.S.C.section 360bbb-3(b)(1), unless the authorization is terminated  or revoked sooner.       Influenza A by PCR NEGATIVE NEGATIVE Final   Influenza B by PCR NEGATIVE NEGATIVE Final    Comment: (NOTE) The Xpert Xpress SARS-CoV-2/FLU/RSV plus assay is intended as an  aid in the diagnosis of influenza from Nasopharyngeal swab specimens and should not be used as a sole basis for treatment. Nasal washings and aspirates are unacceptable for Xpert Xpress SARS-CoV-2/FLU/RSV testing.  Fact Sheet for Patients: EntrepreneurPulse.com.au  Fact Sheet for Healthcare Providers: IncredibleEmployment.be  This test is not yet approved or cleared by the [place] FDA and has been authorized for detection and/or diagnosis of SARS-CoV-2 by FDA under an Emergency Use Authorization (EUA). This EUA will remain in effect (meaning this test can be used) for the duration of the COVID-19 declaration under Section 564(b)(1) of the Act, 21 U.S.C. section 360bbb-3(b)(1), unless the authorization is terminated or revoked.  Performed at [facility], [address] [place], [place] [address]   Urine Culture     Status: Abnormal   Collection Time: 12/20/21 12:30 AM   Specimen: Urine, Random  Result Value Ref Range Status   Specimen Description   Final     URINE, RANDOM Performed at [facility], [address], [place], [place] [address]    Special Requests   Final    NONE Performed at [facility], [address] [place], [place] [address]    Culture (A)  Final    <10,000 COLONIES/mL INSIGNIFICANT GROWTH Performed at [facility], [place] [address] [place], [place] [address]    Report Status 12/22/2021 FINAL  Final  Urine Culture     Status: Abnormal   Collection Time: 12/22/21  2:20 PM   Specimen: Urine, Clean Catch  Result Value Ref Range Status   Specimen Description   Final    URINE, CLEAN CATCH Performed at [facility], [address], [place], [place] [address]    Special Requests   Final    NONE Performed at [facility], [address] [place],  [address]    Culture MULTIPLE SPECIES PRESENT, SUGGEST RECOLLECTION (A)  Final   Report Status 12/23/2021 FINAL  Final         Radiology Studies: No results found.      Scheduled Meds:  vitamin C  500 mg Oral BID   buPROPion  100 mg Oral Q1200   busPIRone  5 mg Oral Daily   cloNIDine  0.1 mg Oral BID   docusate sodium  200 mg Oral BID   enoxaparin (LOVENOX) injection  40 mg Subcutaneous Q24H   feeding supplement  237 mL Oral TID BM   insulin aspart  0-15 Units Subcutaneous TID WC   insulin aspart  0-5 Units Subcutaneous QHS   lactulose  30 g Oral BID   metoprolol tartrate  50 mg Oral BID   multivitamin with minerals  1 tablet Oral Daily   polyethylene glycol  17 g Oral Daily   zinc sulfate  220 mg Oral Daily   Continuous Infusions:     LOS: 6 days    Time spent: 15 mins     Wyvonnia Dusky, MD [facility] Pager [phone]  If 7PM-7AM, please contact night-coverage 12/25/2021, 7:42 AM

## 2021-12-26 ENCOUNTER — Encounter: Payer: Self-pay | Admitting: Internal Medicine

## 2021-12-26 ENCOUNTER — Encounter: Payer: Self-pay | Admitting: Certified Registered"

## 2021-12-26 ENCOUNTER — Inpatient Hospital Stay: Payer: Medicare Other

## 2021-12-26 ENCOUNTER — Encounter
Admission: EM | Disposition: A | Discharge status: Home Health | Payer: Self-pay | Source: Home / Self Care | Attending: Internal Medicine

## 2021-12-26 DIAGNOSIS — N200 Calculus of kidney: Secondary | ICD-10-CM

## 2021-12-26 DIAGNOSIS — Z960 Presence of urogenital implants: Secondary | ICD-10-CM

## 2021-12-26 DIAGNOSIS — L89159 Pressure ulcer of sacral region, unspecified stage: Secondary | ICD-10-CM

## 2021-12-26 DIAGNOSIS — N179 Acute kidney failure, unspecified: Secondary | ICD-10-CM

## 2021-12-26 HISTORY — PX: CYSTOSCOPY W/ URETERAL STENT PLACEMENT: SHX1429

## 2021-12-26 LAB — CBC
HCT: 31.6 % — ABNORMAL LOW (ref 36.0–46.0)
Hemoglobin: 9.7 g/dL — ABNORMAL LOW (ref 12.0–15.0)
MCH: 25.2 pg — ABNORMAL LOW (ref 26.0–34.0)
MCHC: 30.7 g/dL (ref 30.0–36.0)
MCV: 82.1 fL (ref 80.0–100.0)
Platelets: 268 10*3/uL (ref 150–400)
RBC: 3.85 MIL/uL — ABNORMAL LOW (ref 3.87–5.11)
RDW: 17.3 % — ABNORMAL HIGH (ref 11.5–15.5)
WBC: 5.6 10*3/uL (ref 4.0–10.5)
nRBC: 0 % (ref 0.0–0.2)

## 2021-12-26 LAB — GLUCOSE, CAPILLARY
Glucose-Capillary: 286 mg/dL — ABNORMAL HIGH (ref 70–99)
Glucose-Capillary: 179 mg/dL — ABNORMAL HIGH (ref 70–99)
Glucose-Capillary: 98 mg/dL (ref 70–99)
Glucose-Capillary: 91 mg/dL (ref 70–99)
Glucose-Capillary: 240 mg/dL — ABNORMAL HIGH (ref 70–99)

## 2021-12-26 LAB — BASIC METABOLIC PANEL
Anion gap: 8 (ref 5–15)
BUN: 32 mg/dL — ABNORMAL HIGH (ref 8–23)
CO2: 22 mmol/L (ref 22–32)
Calcium: 9.2 mg/dL (ref 8.9–10.3)
Chloride: 103 mmol/L (ref 98–111)
Creatinine, Ser: 1.86 mg/dL — ABNORMAL HIGH (ref 0.44–1.00)
GFR, Estimated: 28 mL/min — ABNORMAL LOW (ref >60)
Glucose, Bld: 300 mg/dL — ABNORMAL HIGH (ref 70–99)
Potassium: 4.2 mmol/L (ref 3.5–5.1)
Sodium: 133 mmol/L — ABNORMAL LOW (ref 135–145)

## 2021-12-26 SURGERY — CYSTOSCOPY, FLEXIBLE, WITH STENT REPLACEMENT
Anesthesia: LOCAL | Laterality: Bilateral

## 2021-12-26 MED ORDER — INSULIN ASPART 100 UNIT/ML IJ SOLN
3.0000 [IU] | Freq: Three times a day (TID) | INTRAMUSCULAR | Status: DC
  Administered 2021-12-26 – 2021-12-29 (×6): 3 [IU] via SUBCUTANEOUS
  Filled 2021-12-26 (×7): qty 1

## 2021-12-26 MED ORDER — INSULIN DETEMIR 100 UNIT/ML ~~LOC~~ SOLN
15.0000 [IU] | SUBCUTANEOUS | Status: DC
  Administered 2021-12-26 – 2021-12-29 (×4): 15 [IU] via SUBCUTANEOUS
  Filled 2021-12-26 (×4): qty 0.15

## 2021-12-26 MED ORDER — STERILE WATER FOR IRRIGATION IR SOLN
Status: DC | PRN
  Administered 2021-12-26: 1000 mL via INTRAVESICAL

## 2021-12-26 SURGICAL SUPPLY — 20 items
BAG DRAIN CYSTO-URO LG1000N (MISCELLANEOUS) ×2 IMPLANT
BRUSH SCRUB 4% CHG (MISCELLANEOUS) ×1 IMPLANT
BRUSH SCRUB EZ 1% IODOPHOR (MISCELLANEOUS) ×1 IMPLANT
CATH FOL 2WAY LX 16X30 (CATHETERS) ×1 IMPLANT
CATH URETL OPEN 5X70 (CATHETERS) IMPLANT
GAUZE 4X4 16PLY ~~LOC~~+RFID DBL (SPONGE) ×4 IMPLANT
GLOVE SURG UNDER POLY LF SZ7.5 (GLOVE) ×2 IMPLANT
GOWN STRL REUS W/ TWL XL LVL3 (GOWN DISPOSABLE) ×1 IMPLANT
GOWN STRL REUS W/TWL XL LVL3 (GOWN DISPOSABLE) ×1
GUIDEWIRE STR DUAL SENSOR (WIRE) ×1 IMPLANT
IV NS IRRIG 3000ML ARTHROMATIC (IV SOLUTION) ×2 IMPLANT
KIT TURNOVER CYSTO (KITS) ×2 IMPLANT
MANIFOLD NEPTUNE II (INSTRUMENTS) ×1 IMPLANT
PACK CYSTO AR (MISCELLANEOUS) ×2 IMPLANT
SET CYSTO W/LG BORE CLAMP LF (SET/KITS/TRAYS/PACK) ×2 IMPLANT
STENT URET 6FRX24 CONTOUR (STENTS) IMPLANT
STENT URET 6FRX26 CONTOUR (STENTS) IMPLANT
SURGILUBE 2OZ TUBE FLIPTOP (MISCELLANEOUS) ×2 IMPLANT
WATER STERILE IRR 1000ML POUR (IV SOLUTION) ×1 IMPLANT
WATER STERILE IRR 500ML POUR (IV SOLUTION) ×3 IMPLANT

## 2021-12-26 NOTE — Progress Notes (Signed)
[place] [facility]) Hospital Liaison note:  This is a pending outpatient-based Palliative Care patient. Will continue to follow for disposition.  Please call with any outpatient palliative questions or concerns.  Thank you, Lorelee Market, LPN [facility] Liaison [phone]

## 2021-12-26 NOTE — OR Nursing (Signed)
Bilateral ureteral stents removed by Dr. Bernardo Heater.

## 2021-12-26 NOTE — Progress Notes (Signed)
Nutrition Follow-up  DOCUMENTATION CODES:  Obesity unspecified  INTERVENTION:  Continue 500 mg of Vitamin C BID.   Continue 220 mg of Zinc daily for 14 days.   Continue Ensure Plus High Protein po TID, each supplement provides 350 kcal and 20 grams of protein.    Continue MVI with minerals daily.   Continue to encourage PO and supplement intake.  NUTRITION DIAGNOSIS:  Increased nutrient needs related to wound healing as evidenced by estimated needs. - ongoing, addressed with supplements  GOAL:  Patient will meet greater than or equal to 90% of their needs. - consistently meeting  MONITOR:  PO intake, Supplement acceptance, Diet advancement, Labs, Weight trends, Skin, I & O's  REASON FOR ASSESSMENT:  Malnutrition Screening Tool    ASSESSMENT:  76 yo female with a PMH of T2DM, HTN, nonambulatory at baseline with stage IV sacrum decubitus with chronic coccygeal osteomyelitis currently on wound VAC, CKD stage 3b, anemia of chronic disease, nephrolithiasis , hospitalized from 1/7-1/17 with severe sepsis secondary to UTI, undergoing lithotripsy and bilateral ureteral stent placement on 1/14 with Foley removal on 1/16, who presented to the emergency room initially for worsening sacral pain. Admitted with AKI on CKD stage 3b. 1/31 - removal of bilateral ureteral stents  Per MD, anticipated discharge is tomorrow.  Spoke with pt at bedside. Pt feeling much better, appetite is returning and she is wanting to return home.  Pt eating well, eating 100% of almost every meal of the last 8 meals.  Admit wt: 87.2 kg Current wt: 87.8 kg  Continue current nutrition plan.  Supplements: Ensure TID  Medications: reviewed; Vitamin C BID, colace BID, SSI, Levemir, MVI with minerals, miralax, zinc sulfate, Vicodin PO PRN (given once today)  Labs: reviewed; Na 133 (L), CBG 179-286 (H)  Diet Order:   Diet Order             Diet Carb Modified Fluid consistency: Thin; Room service  appropriate? Yes  Diet effective now                  EDUCATION NEEDS:  Education needs have been addressed  Skin:  Skin Assessment: Skin Integrity Issues: Skin Integrity Issues:: Stage IV, Incisions Stage IV: Sacrum Incisions: Vagina (1/13)  Last BM:  12/25/21  Height:  Ht Readings from Last 1 Encounters:  12/19/21 5\' 3"  (1.6 m)   Weight:  Wt Readings from Last 1 Encounters:  12/24/21 87.8 kg   BMI:  Body mass index is 34.29 kg/m.  Estimated Nutritional Needs:  Kcal:  1800-2000 Protein:  105-120 grams Fluid:  >1.8 L  Derrel Nip, RD, LDN (she/her/hers) Clinical Inpatient Dietitian RD Pager/After-Hours/Weekend Pager # in [place]

## 2021-12-26 NOTE — Consult Note (Signed)
76 y.o. female admitted with AKI and sacral decubitus.  Underwent bilateral ureteroscopy with laser lithotripsy/stone removal by Dr. Diamantina Providence 12/08/2021.  She was scheduled for outpatient stent removal tomorrow and Dr. Jimmye Norman contacted me today to see if stent removal was possible during her hospitalization.  Urine culture on admission showed no significant growth.  Creatinine is improving.  Plan: I have time on OR schedule today for cystoscopy with stent removal.  She had not been n.p.o. and was agreeable to performing in the OR without sedation/anesthesia.  All questions were answered and she desires to proceed

## 2021-12-26 NOTE — Progress Notes (Signed)
Inpatient Diabetes Program Recommendations  AACE/ADA: New Consensus Statement on Inpatient Glycemic Control  Target Ranges:  Prepandial:   less than 140 mg/dL      Peak postprandial:   less than 180 mg/dL (1-2 hours)      Critically ill patients:  140 - 180 mg/dL    Latest Reference Range & Units 12/26/21 07:41  Glucose-Capillary 70 - 99 mg/dL 286 (H)    Latest Reference Range & Units 12/25/21 07:21 12/25/21 11:29 12/25/21 16:46 12/25/21 20:31  Glucose-Capillary 70 - 99 mg/dL 203 (H) 200 (H) 235 (H) 284 (H)   Review of Glycemic Control  Diabetes history: DM2 Outpatient Diabetes medications: Levemir 30 units BID, Novolog 0-10 units TID with meals, Januvia 25 mg daily Current orders for Inpatient glycemic control: Novolog 0-15 units TID with meals, Novolog 0-5 units QHS   Inpatient Diabetes Program Recommendations:     Insulin: Please consider ordering Levemir 15 units Q24H and Novolog 5 units TID with meals for meal coverage if patient eats at least 50% of meals.   Thanks, Barnie Alderman, RN, MSN, CDE Diabetes Coordinator Inpatient Diabetes Program [phone] (Team Pager from 8am to 5pm)

## 2021-12-26 NOTE — Progress Notes (Signed)
PROGRESS NOTE   HPI was taken from Dr. Damita Dunnings: Heather Lopez is a 76 y.o. 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  She also complains of weakness since her recent discharge, dizziness and feeling like she is about to pass out.  She denies chest pain or palpitations, headaches or one-sided numbness tingling or weakness.  Denies visual disturbance.  Denies nausea, vomiting, abdominal pain or diarrhea   ED course: Afebrile, with soft BP as low as 103/48 in the ED with otherwise normal vitals Blood work: WBC 9.8 with lactic acid 2-0 0.9 Creatinine 2.35, up from 1.34 on 1/10 Hemoglobin 9.7 down from 10.5 on 1/10 Urinalysis pending COVID and influenza PCR pending   EKG: Not done from the ED.  Currently pending   Patient was hydrated in the ED with improvement in BP, normalization of lactate but with increase in creatinine on repeat.  Due to persistent generalized weakness, AKI, observation requested.     As per Dr. Jimmye Norman 1/25-1/31/23: Pt was found to have AKI on CKDIIIb. Cr is trending down from day prior. Of note, pt presented w/ significant buttocks pain likely secondary to stage IV decubitus ulcer of sacral region, which was present on admission. Pt will continue w/ wound vac and wound vac changes. Wound vac was present on admission. Furthermore, pt had ureteral stents placed by Dr. Bernardo Heater approx 2 weeks ago and request that those stents be removed while she in the hospital. Dr. Bernardo Heater will remove the stents at the end of the day.   Heather Lopez  RKY:[id number] DOB: 12-14-1945 DOA:  12/19/2021 PCP: [name], PA-C    Assessment & Plan:   Principal Problem:   AKI (acute kidney injury) ([place]) Active Problems:   Depression   Diabetes mellitus, type II ([place])   Acute kidney injury superimposed on CKD ([place])   Bilateral nephrolithiasis   Acute on chronic anemia   Essential hypertension   Chronic osteomyelitis of coccyx ([place])   Decubitus ulcer of sacral region, stage 4 (HCC)   Lactic acidosis   Status post placement of ureteral stent 12/09/2021   AKI on CKDIIIb: Cr is trending down from day prior. Avoid nephrotoxic meds    ACD: likely secondary to chronic blood loss from wound VAC. H&H are stable   Lactic acidosis: resolved. No sepsis currently   B/l nephrolithiasis: s/p ureteral stent 12/09/2021. Stents will be removed today by urology. Urine cx shows insignificant growth. Repeat urine cx shows containment    Decubitus ulcer of sacral region/ chronic osteomyelitis of coccyx: stage IV. Continue w/ wound vac. Buttocks pain improved today. Morphine, norco prn for pain  Constipation: resolved   Depression: severity unknown. Continue on home dose of buspirone, bupropion   DM2: well controlled, HbA1c 5.9. Continue on SSI w/ accuchecks  HTN: continue on home dose of metoprolol, clonidine   Obesity: BMI 32.9. Complicates overall care & prognosis    DVT prophylaxis: lovenox  Code Status: DNR Family Communication:  Disposition Plan: d/c back home w/ HH   Level of care: Med-Surg  Status is: Inpatient  Remains inpatient appropriate because: severity of illness, still w/ buttocks pain. Will have  ureteral stents removed by urology today. Can likely d/c in 24-48hrs if pain has improved     Consultants:    Procedures:   Antimicrobials:    Subjective: Pt still c/o buttocks pain   Objective: Vitals:   12/25/21 0733 12/25/21 1543 12/25/21 1956 12/26/21 0346  BP: (!) 132/50 (!) 156/56 (!) 137/50 (!) 130/50  Pulse: 61 70 75 73  Resp: 18 18 16 16    Temp: 97.9 F (36.6 C) 98.2 F (36.8 C) 99.1 F (37.3 C) 99.1 F (37.3 C)  TempSrc: Oral Oral Oral   SpO2: 99% 97% 96% 98%  Weight:      Height:        Intake/Output Summary (Last 24 hours) at 12/26/2021 0740 Last data filed at 12/26/2021 0600 Gross per 24 hour  Intake 478 ml  Output 750 ml  Net -272 ml   Filed Weights   12/22/21 0500 12/23/21 0500 12/24/21 0432  Weight: 84.3 kg 86.3 kg 87.8 kg    Examination:  General exam: appears calm but uncomfortable   Respiratory system: clear breath sounds b/l  Cardiovascular system: S1 & S2+. No rubs or clicks  Gastrointestinal system: abd is soft, NT, ND & hypoactive bowel sounds  Central nervous system: alert and oriented. Moves all extremities  Psychiatry: judgement and insight appears normal. Flat mood and affect   Data Reviewed: I have personally reviewed following labs and imaging studies  CBC: Recent Labs  Lab 12/19/21 1411 12/20/21 0432 12/22/21 0425 12/23/21 0544 12/24/21 0419 12/25/21 0434 12/26/21 0439  WBC 9.8   < > 6.2 6.8 7.0 5.7 5.6  NEUTROABS 7.0  --   --   --   --   --   --   HGB 9.7*   < > 9.1* 9.5* 9.8* 9.6* 9.7*  HCT 31.7*   < > 29.9* 31.1* 31.9* 31.4* 31.6*  MCV 82.6   < > 84.9 83.6 83.9 82.2 82.1  PLT 376   < > 326 319 310 289 268   < > = values in this interval not displayed.   Basic Metabolic Panel: Recent Labs  Lab 12/22/21 0425 12/23/21 0544 12/24/21 0419 12/25/21 0434 12/26/21 0439  NA 138 138 134* 134* 133*  K 4.1 4.1 4.4 4.2 4.2  CL 111 108 104 107 103  CO2 22 22 20* 21* 22  GLUCOSE 167* 192* 175* 207* 300*  BUN 24* 24* 23 27* 32*  CREATININE 1.55* 1.46* 1.51* 1.95* 1.86*  CALCIUM 9.0 9.2 9.3 8.9 9.2   GFR: Estimated Creatinine Clearance: 27.9 mL/min (A) (by C-G formula based on SCr of 1.86 mg/dL (H)). Liver Function Tests: Recent Labs  Lab 12/19/21 1411  AST 20  ALT 25  ALKPHOS 106  BILITOT 0.3  PROT 6.4*  ALBUMIN 2.8*   No results for input(s): LIPASE, AMYLASE in  the last 168 hours. No results for input(s): AMMONIA in the last 168 hours. Coagulation Profile: No results for input(s): INR, PROTIME in the last 168 hours. Cardiac Enzymes: No results for input(s): CKTOTAL, CKMB, CKMBINDEX, TROPONINI in the last 168 hours. BNP (last 3 results) No results for input(s): PROBNP in the last 8760 hours. HbA1C: No results for input(s): HGBA1C in the last 72 hours. CBG: Recent Labs  Lab 12/24/21 2115 12/25/21 0721 12/25/21 1129 12/25/21 1646 12/25/21 2031  GLUCAP 221* 203* 200* 235* 284*   Lipid Profile: No results for input(s): CHOL, HDL, LDLCALC, TRIG, CHOLHDL, LDLDIRECT in the last 72 hours. Thyroid Function Tests: No results for  input(s): TSH, T4TOTAL, FREET4, T3FREE, THYROIDAB in the last 72 hours. Anemia Panel: No results for input(s): VITAMINB12, FOLATE, FERRITIN, TIBC, IRON, RETICCTPCT in the last 72 hours. Sepsis Labs: Recent Labs  Lab 12/19/21 1411 12/19/21 2010  LATICACIDVEN 2.0* 0.9    Recent Results (from the past 240 hour(s))  Resp Panel by RT-PCR (Flu A&B, Covid) Nasopharyngeal Swab     Status: None   Collection Time: 12/19/21 11:00 PM   Specimen: Nasopharyngeal Swab; Nasopharyngeal(NP) swabs in vial transport medium  Result Value Ref Range Status   SARS Coronavirus 2 by RT PCR NEGATIVE NEGATIVE Final    Comment: (NOTE) SARS-CoV-2 target nucleic acids are NOT DETECTED.  The SARS-CoV-2 RNA is generally detectable in upper respiratory specimens during the acute phase of infection. The lowest concentration of SARS-CoV-2 viral copies this assay can detect is 138 copies/mL. A negative result does not preclude SARS-Cov-2 infection and should not be used as the sole basis for treatment or other patient management decisions. A negative result may occur with  improper specimen collection/handling, submission of specimen other than nasopharyngeal swab, presence of viral mutation(s) within the areas targeted by this assay, and  inadequate number of viral copies(<138 copies/mL). A negative result must be combined with clinical observations, patient history, and epidemiological information. The expected result is Negative.  Fact Sheet for Patients:  EntrepreneurPulse.com.au  Fact Sheet for Healthcare Providers:  IncredibleEmployment.be  This test is no t yet approved or cleared by the [place] FDA and  has been authorized for detection and/or diagnosis of SARS-CoV-2 by FDA under an Emergency Use Authorization (EUA). This EUA will remain  in effect (meaning this test can be used) for the duration of the COVID-19 declaration under Section 564(b)(1) of the Act, 21 U.S.C.section 360bbb-3(b)(1), unless the authorization is terminated  or revoked sooner.       Influenza A by PCR NEGATIVE NEGATIVE Final   Influenza B by PCR NEGATIVE NEGATIVE Final    Comment: (NOTE) The Xpert Xpress SARS-CoV-2/FLU/RSV plus assay is intended as an aid in the diagnosis of influenza from Nasopharyngeal swab specimens and should not be used as a sole basis for treatment. Nasal washings and aspirates are unacceptable for Xpert Xpress SARS-CoV-2/FLU/RSV testing.  Fact Sheet for Patients: EntrepreneurPulse.com.au  Fact Sheet for Healthcare Providers: IncredibleEmployment.be  This test is not yet approved or cleared by the [place] FDA and has been authorized for detection and/or diagnosis of SARS-CoV-2 by FDA under an Emergency Use Authorization (EUA). This EUA will remain in effect (meaning this test can be used) for the duration of the COVID-19 declaration under Section 564(b)(1) of the Act, 21 U.S.C. section 360bbb-3(b)(1), unless the authorization is terminated or revoked.  Performed at [facility], [address], [place], [place] [address]   Urine Culture     Status: Abnormal   Collection Time: 12/20/21 12:30 AM   Specimen:  Urine, Random  Result Value Ref Range Status   Specimen Description   Final    URINE, RANDOM Performed at [facility], [address] [place], [place] [address]    Special Requests   Final    NONE Performed at [facility], [address] [place], [place] [address]    Culture (A)  Final    <10,000 COLONIES/mL INSIGNIFICANT GROWTH Performed at [facility], [place] [address] [place], [place] [address]    Report Status 12/22/2021 FINAL  Final  Urine Culture     Status: Abnormal   Collection  Time: 12/22/21  2:20 PM   Specimen: Urine, Clean Catch  Result Value Ref Range Status   Specimen Description   Final    URINE, CLEAN CATCH Performed at [facility], [address] [place], [place] [address]    Special Requests   Final    NONE Performed at [facility], [address] [place], [place] [address]    Culture MULTIPLE SPECIES PRESENT, SUGGEST RECOLLECTION (A)  Final   Report Status 12/23/2021 FINAL  Final         Radiology Studies: DG Chest Port 1 View  Result Date: 12/25/2021 CLINICAL DATA:  A 76 year old female presents with shortness of breath. EXAM: PORTABLE CHEST 1 VIEW COMPARISON:  December 03, 2021 FINDINGS: PICC line has been removed. Cardiomediastinal contours and hilar structures are stable. No lobar level consolidation. Signs of airspace disease at the LEFT lung base grossly similar terms of morphology, slightly increased lateral to the LEFT heart border since the previous study. Blunting of the LEFT costodiaphragmatic sulcus. On limited assessment there is no acute skeletal process. IMPRESSION: 1. Signs of airspace disease at the LEFT lung base grossly similar terms of morphology, slightly increased lateral to the LEFT heart border since the previous study. Findings favored to represent a mixture of atelectasis or scarring. No lobar consolidation. 2. Possible small LEFT effusion. Electronically Signed   By: Zetta Bills  M.D.   On: 12/25/2021 09:19        Scheduled Meds:  vitamin C  500 mg Oral BID   buPROPion  100 mg Oral Q1200   busPIRone  5 mg Oral Daily   cloNIDine  0.1 mg Oral BID   docusate sodium  200 mg Oral BID   enoxaparin (LOVENOX) injection  40 mg Subcutaneous Q24H   feeding supplement  237 mL Oral TID BM   insulin aspart  0-15 Units Subcutaneous TID WC   insulin aspart  0-5 Units Subcutaneous QHS   metoprolol tartrate  50 mg Oral BID   multivitamin with minerals  1 tablet Oral Daily   polyethylene glycol  17 g Oral Daily   zinc sulfate  220 mg Oral Daily   Continuous Infusions:     LOS: 7 days    Time spent: 15 mins     Wyvonnia Dusky, MD [facility] Pager [phone]  If 7PM-7AM, please contact night-coverage 12/26/2021, 7:40 AM

## 2021-12-26 NOTE — TOC Progression Note (Signed)
Transition of Care [facility]) - Progression Note    Patient Details  Name: Heather Lopez MRN: [id number] Date of Birth: 1946-08-19  [facility] of [facility]) CM/SW Contact  Beverly Sessions, RN Phone Number: 12/26/2021, 1:14 PM  Clinical Narrative:    Per MD anticipated DC tomorrow.  Jason with [place] updated         Expected Discharge Plan and Services                                                 Social Determinants of Health (SDOH) Interventions    Readmission Risk Interventions Readmission Risk Prevention Plan 12/20/2021 12/03/2021  Transportation Screening Complete Complete  HRI or Home Care Consult Complete -  Social Work Consult for [place] Planning/Counseling Complete -  Palliative Care Screening Complete -  Medication Review Press photographer) Complete Complete  PCP or Specialist appointment within 3-5 days of discharge - Complete  SW Recovery Care/Counseling Consult - Complete  [place] - Complete  Some recent data might be hidden

## 2021-12-26 NOTE — Op Note (Signed)
Preoperative diagnosis:  Bilateral indwelling ureteral stents  Postoperative diagnosis:  Same  Procedure: Cystoscopy with stent removal-bilateral  Surgeon: Abbie Sons, MD  Anesthesia: None  Complications: None  Intraoperative findings:  Bilateral indwelling stents  EBL: None  Specimens: None  Indication: Heather Lopez is a 76 y.o. female status post bilateral ureteroscopy with stone removal by Dr. Diamantina Providence 12/08/2021.  She was scheduled for outpatient stent removal 12/27/2021 but was recently hospitalized with AKI which is improving and a sacral decubitus.  Stent removal was requested prior to discharge if possible.  After reviewing the management options for treatment, he elected to proceed with the above surgical procedure(s). We have discussed the potential benefits and risks of the procedure, side effects of the proposed treatment, the likelihood of the patient achieving the goals of the procedure, and any potential problems that might occur during the procedure or recuperation. Informed consent has been obtained.  Description of procedure:  The patient was taken to the operating room and transferred to the cystoscopy table.  She was unable to abduct her left hip and was left supine on the table with knees bent.  She was prepped and draped in the usual sterile fashion, and preoperative antibiotics were administered. A preoperative time-out was performed.   A 16 French flexible cystoscope was lubricated and passed per urethra.  Visualization was difficult due to cloudy urine.  The cystoscope was removed and a 16 French Foley catheter was placed with return of cloudy urine.  The bladder was irrigated until clear and the catheter was removed.  The cystoscope was repassed.  The left ureteral stent was then applied, grasped with endoscopic forceps and removed without difficulty.  The cystoscope was repassed and the right ureteral stent was removed in a similar fashion.  She was transferred  back to the OR table and to her room in stable condition.  There were no complications.  Plan: Office follow-up 4-6 weeks   Abbie Sons, M.D.

## 2021-12-27 ENCOUNTER — Encounter: Payer: Self-pay | Admitting: Urology

## 2021-12-27 ENCOUNTER — Encounter: Payer: Medicare Other | Admitting: Urology

## 2021-12-27 LAB — GLUCOSE, CAPILLARY
Glucose-Capillary: 237 mg/dL — ABNORMAL HIGH (ref 70–99)
Glucose-Capillary: 176 mg/dL — ABNORMAL HIGH (ref 70–99)
Glucose-Capillary: 179 mg/dL — ABNORMAL HIGH (ref 70–99)
Glucose-Capillary: 195 mg/dL — ABNORMAL HIGH (ref 70–99)

## 2021-12-27 LAB — CBC
HCT: 30.2 % — ABNORMAL LOW (ref 36.0–46.0)
Hemoglobin: 9.3 g/dL — ABNORMAL LOW (ref 12.0–15.0)
MCH: 25.5 pg — ABNORMAL LOW (ref 26.0–34.0)
MCHC: 30.8 g/dL (ref 30.0–36.0)
MCV: 82.7 fL (ref 80.0–100.0)
Platelets: 279 10*3/uL (ref 150–400)
RBC: 3.65 MIL/uL — ABNORMAL LOW (ref 3.87–5.11)
RDW: 17.3 % — ABNORMAL HIGH (ref 11.5–15.5)
WBC: 6 10*3/uL (ref 4.0–10.5)
nRBC: 0 % (ref 0.0–0.2)

## 2021-12-27 LAB — BASIC METABOLIC PANEL
Anion gap: 5 (ref 5–15)
BUN: 35 mg/dL — ABNORMAL HIGH (ref 8–23)
CO2: 23 mmol/L (ref 22–32)
Calcium: 9.2 mg/dL (ref 8.9–10.3)
Chloride: 102 mmol/L (ref 98–111)
Creatinine, Ser: 2.15 mg/dL — ABNORMAL HIGH (ref 0.44–1.00)
GFR, Estimated: 24 mL/min — ABNORMAL LOW (ref >60)
Glucose, Bld: 231 mg/dL — ABNORMAL HIGH (ref 70–99)
Potassium: 4.4 mmol/L (ref 3.5–5.1)
Sodium: 130 mmol/L — ABNORMAL LOW (ref 135–145)

## 2021-12-27 MED ORDER — BELLADONNA ALKALOIDS-OPIUM 16.2-30 MG RE SUPP
1.0000 | Freq: Four times a day (QID) | RECTAL | Status: DC | PRN
  Filled 2021-12-27: qty 1

## 2021-12-27 NOTE — Progress Notes (Signed)
Dr Loleta Books has requested that nursing get a catheterized urine culture and place a foley if post void bladder scan reveals greater than 349ml

## 2021-12-27 NOTE — Assessment & Plan Note (Addendum)
S/p ureteral stent removal yesterday -B&O suppository PRN - Check bladder scan:   - If >300, will obtain catheterized urine for culture and place foley

## 2021-12-27 NOTE — Progress Notes (Signed)
°  Progress Note   Patient: Heather Lopez [id number]:[id number] DOB: [birth year] DOA: 12/19/2021     8 DOS: the patient was seen and examined on 12/27/2021        Brief hospital course: Heather Lopez is a 76 y.o. F with DM, HTN, nonambulatory at baseline with stage IV sacrum decubitus with chronic coccygeal osteomyelitis currently on wound VAC, CKD stage 3b, anemia of chronic disease, nephrolithiasis , hospitalized from 1/7-1/17 with severe sepsis secondary to UTI, undergoing lithotripsy and bilateral ureteral stent placement on 1/14 with Foley removal on 1/16, who presented for worsening sacral pain, found to have Cr 2.35, up from baseline 1.3  Admitted and started on IV fluids       Assessment and Plan: * AKI (acute kidney injury) ([place])- (present on admission) Cr stable  Acute kidney injury superimposed on CKD ([place])- (present on admission) Cr stabilized around 2  Bilateral nephrolithiasis S/p ureteral stent removal yesterday -B&O suppository PRN - Check bladder scan:   - If >300, will obtain catheterized urine for culture and place foley  Chronic osteomyelitis of coccyx ([place]) Stage IV chronic decubitus ulcer, POA -Continue home pain medication  Essential hypertension- (present on admission) Blood pressure normal - Continue clonidine - Hold metoprolol  Acute on chronic anemia Hgb stable, no change, no bleeding  Diabetes mellitus, type II (HCC) Glucose labile - Continue Levemir, sliding scale correction -Hold home Januvia  Depression- (present on admission) -Continue BuSpar, Wellbutrin          Subjective: Patient is having bladder cramps, flank cramps, pain in her sacral ulcer.  No fever, confusion, vomiting.  Notes some dysuria.  Physical Exam: Vitals:   12/26/21 1548 12/26/21 2023 12/27/21 0442 12/27/21 0751  BP: 131/90 (!) 119/50 (!) 126/51 (!) 127/49  Pulse: 82 88 65 68  Resp: 20 20 18    Temp:  98.2 F (36.8 C) 98.6 F (37 C) 97.6 F (36.4 C)  TempSrc:   Oral  Oral  SpO2: 100% 93% 98% 98%  Weight:      Height:       Normal adult female, lying on her side, no acute distress.  Appears tired and chronically ill. Heart rate regular, soft systolic murmur, no lower extremity edema, no jugular vein visible due to body habitus Respiratory rate normal, lungs clear without rales or wheezes, abdomen soft with suprapubic tenderness, no other voluntary guarding or distention Awake and alert, extraocular movements intact, face symmetric, attention normal, oriented to person place and time      Data Reviewed: Labs and imaging are notable for creatinine slightly up to 2.15, sodium 130, no change Hemogram notable for hemoglobin 9.3, no change from previous Glucose labile Discussed with urology  Family Communication: Husband at the bedside  Disposition: Status is: Inpatient Remains inpatient appropriate because: She requires ongoing pain management, evaluation of her bladder pain given murky urine.  She is considered too high risk to discharge today  Planned Discharge Destination: Home with Home Health            Author: Edwin Dada, MD 12/27/2021 2:53 PM  For on call review [email or web address].

## 2021-12-27 NOTE — Assessment & Plan Note (Signed)
Hgb stable, no change, no bleeding

## 2021-12-27 NOTE — Hospital Course (Signed)
Mrs. Ostrander is a 76 y.o. F with DM, HTN, nonambulatory at baseline with stage IV sacrum decubitus with chronic coccygeal osteomyelitis currently on wound VAC, CKD stage 3b, anemia of chronic disease, nephrolithiasis , hospitalized from 1/7-1/17 with severe sepsis secondary to UTI, undergoing lithotripsy and bilateral ureteral stent placement on 1/14 with Foley removal on 1/16, who presented for worsening sacral pain, found to have Cr 2.35, up from baseline 1.3  Admitted and started on IV fluids

## 2021-12-27 NOTE — Assessment & Plan Note (Signed)
Glucose labile - Continue Levemir, sliding scale correction -Hold home Januvia

## 2021-12-27 NOTE — Assessment & Plan Note (Signed)
-  Continue BuSpar, Wellbutrin

## 2021-12-27 NOTE — Consult Note (Signed)
[place] Nurse wound follow up Patient receiving care in [facility]. Wound type: stage 4 PI to sacrum Measurement: see note from 2 days ago Wound bed: 100% pink Drainage (amount, consistency, odor) none in cannister Periwound: intact Dressing procedure/placement/frequency: All black foam removed. Barrier ring placed around wound. One piece of black foam placed into wound, bridged to left hip. Drape applied, immediate seal obtained. Patient had been given PO meds prior to dressing change. Patient tolerated very well.  Val Riles, RN, MSN, CWOCN, CNS-BC, pager [phone]

## 2021-12-27 NOTE — Plan of Care (Signed)
°  Problem: Clinical Measurements: Goal: Diagnostic test results will improve Outcome: Progressing   Problem: Pain Managment: Goal: General experience of comfort will improve Outcome: Progressing   

## 2021-12-27 NOTE — Assessment & Plan Note (Signed)
Blood pressure normal - Continue clonidine - Hold metoprolol

## 2021-12-27 NOTE — Assessment & Plan Note (Signed)
Cr stable 

## 2021-12-27 NOTE — Assessment & Plan Note (Addendum)
-  Continue home pain medication

## 2021-12-27 NOTE — Assessment & Plan Note (Signed)
Cr stabilized around 2

## 2021-12-28 LAB — GLUCOSE, CAPILLARY
Glucose-Capillary: 210 mg/dL — ABNORMAL HIGH (ref 70–99)
Glucose-Capillary: 198 mg/dL — ABNORMAL HIGH (ref 70–99)
Glucose-Capillary: 162 mg/dL — ABNORMAL HIGH (ref 70–99)
Glucose-Capillary: 131 mg/dL — ABNORMAL HIGH (ref 70–99)

## 2021-12-28 MED ORDER — LACTATED RINGERS IV SOLN: INTRAVENOUS | Status: DC

## 2021-12-28 MED ORDER — ENOXAPARIN SODIUM 30 MG/0.3ML IJ SOSY: 30.0000 mg | PREFILLED_SYRINGE | INTRAMUSCULAR | Status: DC

## 2021-12-28 NOTE — TOC Progression Note (Signed)
Transition of Care [facility]) - Progression Note    Patient Details  Name: Heather Lopez MRN: [id number] Date of Birth: [birth year]  Transition of Care [facility]) CM/SW Contact  Beverly Sessions, RN Phone Number: 12/28/2021, 9:47 AM  Clinical Narrative:    Plan remains to discharge home with resumption of home health services and vac changes through [place] when medially cleared for discharge         Expected Discharge Plan and Services                                                 Social Determinants of Health (SDOH) Interventions    Readmission Risk Interventions Readmission Risk Prevention Plan 12/20/2021 12/03/2021  Transportation Screening Complete Complete  HRI or Home Care Consult Complete -  Social Work Consult for [place] Planning/Counseling Complete -  Palliative Care Screening Complete -  Medication Review Press photographer) Complete Complete  PCP or Specialist appointment within 3-5 days of discharge - Complete  SW Recovery Care/Counseling Consult - Complete  [address] - Complete  Some recent data might be hidden

## 2021-12-28 NOTE — Progress Notes (Addendum)
PROGRESS NOTE  Heather Lopez AOZ:[id number] DOB: 1946-07-13 DOA: 12/19/2021 PCP: [name], PA-C  HPI/Recap of past 24 hours: Heather Lopez is a 76 y.o. F with DM, HTN, nonambulatory at baseline with stage IV sacrum decubitus with chronic coccygeal osteomyelitis currently on wound VAC, CKD stage 3b, anemia of chronic disease, nephrolithiasis, hospitalized from 1/7-1/17 with severe sepsis secondary to UTI, undergoing lithotripsy and bilateral ureteral stent placement on 1/14 with Foley removal on 1/16, who presented for worsening sacral pain, found to have Cr 2.35, up from baseline 1.3.  Work-up revealed nonoliguric AKI and chronic sacral wound since September 2022, seen by ID at her last admission where she received IV antibiotics for her chronic osteomyelitis.  Post wound VAC placement.   12/28/2021: Patient was seen and examined at her bedside.  Her husband was present in the room.  Wound VAC in place.  She reports her sacral pain is 7 out of 10 all the time.  Does not follow-up with general surgery or infectious disease.  Endorses having sacral wound since September 2022.  Unclear if she is bedbound.    Assessment/Plan: Principal Problem:   AKI (acute kidney injury) ([place]) Active Problems:   Depression   Diabetes mellitus, type II ([place])   Acute kidney injury superimposed on CKD ([place])   Bilateral nephrolithiasis   Acute on chronic anemia   Essential hypertension   Chronic osteomyelitis of coccyx (HCC)   Decubitus ulcer of sacral region, stage 4 (HCC)   Lactic acidosis   Status post placement of ureteral stent 12/09/2021  Nonoliguric AKI (acute kidney injury) ([place]) on CKD 3B- (present on admission), unclear etiology. Creatinine is uptrending despite IV fluid hydration, 2.15 from 1.86. Baseline creatinine appears to be 1.5 with GFR of 35. Continue to avoid nephrotoxic agents, dehydration and hypotension. Continue to closely monitor urine output with strict I's and O's Repeat renal panel in  the morning.  Chronic sacral wound on wound VAC/chronic osteomyelitis of coccyx seen on CT scan Follows with wound care, first appointment at the wound care clinic on Monday. Continue pain management and bowel regimen. Obtain CRP and ESR General surgery consulted to assist with the management.   Bilateral nephrolithiasis S/p ureteral stent removal yesterday -B&O suppository PRN - Check bladder scan:              - If >300, will obtain catheterized urine for culture and place foley   Chronic osteomyelitis of coccyx (HCC) Stage IV chronic decubitus ulcer, POA -Continue home pain medication   Essential hypertension- (present on admission) Blood pressure normal - Continue clonidine - Hold metoprolol Continue to monitor vital signs   Acute on chronic anemia Hgb stable, no change, no bleeding   Diabetes mellitus, type II (HCC) Glucose labile - Continue Levemir, sliding scale correction -Hold home Januvia   Depression- (present on admission) -Continue BuSpar, Wellbutrin  Physical debility Patient has declined PT OT while inpatient Continue fall precautions                 Code Status: DNR  Family Communication: Husband at bedside  Disposition Plan: Likely will discharge to home once hemodynamically stable.   Consultants: General surgery  Procedures: None  Antimicrobials: None  DVT prophylaxis: Subcu Lovenox daily.  Status is: Inpatient  Patient requires at least 2 midnights for further evaluation and treatment of present condition.      Objective: Vitals:   12/27/21 2104 12/28/21 0353 12/28/21 0500 12/28/21 0814  BP: (!) 145/77 (!) 149/57  [name])  154/52  Pulse: 71 67  68  Resp: 18 18    Temp: 98.6 F (37 C) 98.2 F (36.8 C)  97.9 F (36.6 C)  TempSrc: Oral Oral  Oral  SpO2: 100% 97%  97%  Weight:   86.5 kg   Height:        Intake/Output Summary (Last 24 hours) at 12/28/2021 1457 Last data filed at 12/28/2021 1349 Gross per 24 hour   Intake 347.5 ml  Output 500 ml  Net -152.5 ml   Filed Weights   12/23/21 0500 12/24/21 0432 12/28/21 0500  Weight: 86.3 kg 87.8 kg 86.5 kg    Exam:  General: 76 y.o. year-old female well developed well nourished in no acute distress.  Alert and oriented x3. Cardiovascular: Regular rate and rhythm with no rubs or gallops.  No thyromegaly or JVD noted.   Respiratory: Clear to auscultation with no wheezes or rales. Good inspiratory effort. Abdomen: Soft nontender nondistended with normal bowel sounds x4 quadrants. Musculoskeletal: No lower extremity edema. 2/4 pulses in all 4 extremities. Skin: Sacral wound with wound VAC in place. Psychiatry: Mood is appropriate for condition and setting Neuro: Bilateral lower extremity weakness.   Data Reviewed: CBC: Recent Labs  Lab 12/23/21 0544 12/24/21 0419 12/25/21 0434 12/26/21 0439 12/27/21 0325  WBC 6.8 7.0 5.7 5.6 6.0  HGB 9.5* 9.8* 9.6* 9.7* 9.3*  HCT 31.1* 31.9* 31.4* 31.6* 30.2*  MCV 83.6 83.9 82.2 82.1 82.7  PLT 319 310 289 268 144   Basic Metabolic Panel: Recent Labs  Lab 12/23/21 0544 12/24/21 0419 12/25/21 0434 12/26/21 0439 12/27/21 0325  NA 138 134* 134* 133* 130*  K 4.1 4.4 4.2 4.2 4.4  CL 108 104 107 103 102  CO2 22 20* 21* 22 23  GLUCOSE 192* 175* 207* 300* 231*  BUN 24* 23 27* 32* 35*  CREATININE 1.46* 1.51* 1.95* 1.86* 2.15*  CALCIUM 9.2 9.3 8.9 9.2 9.2   GFR: Estimated Creatinine Clearance: 23.9 mL/min (A) (by C-G formula based on SCr of 2.15 mg/dL (H)). Liver Function Tests: No results for input(s): AST, ALT, ALKPHOS, BILITOT, PROT, ALBUMIN in the last 168 hours. No results for input(s): LIPASE, AMYLASE in the last 168 hours. No results for input(s): AMMONIA in the last 168 hours. Coagulation Profile: No results for input(s): INR, PROTIME in the last 168 hours. Cardiac Enzymes: No results for input(s): CKTOTAL, CKMB, CKMBINDEX, TROPONINI in the last 168 hours. BNP (last 3 results) No results  for input(s): PROBNP in the last 8760 hours. HbA1C: No results for input(s): HGBA1C in the last 72 hours. CBG: Recent Labs  Lab 12/27/21 1133 12/27/21 1629 12/27/21 2107 12/28/21 0813 12/28/21 1130  GLUCAP 176* 179* 195* 210* 198*   Lipid Profile: No results for input(s): CHOL, HDL, LDLCALC, TRIG, CHOLHDL, LDLDIRECT in the last 72 hours. Thyroid Function Tests: No results for input(s): TSH, T4TOTAL, FREET4, T3FREE, THYROIDAB in the last 72 hours. Anemia Panel: No results for input(s): VITAMINB12, FOLATE, FERRITIN, TIBC, IRON, RETICCTPCT in the last 72 hours. Urine analysis:    Component Value Date/Time   COLORURINE YELLOW 12/19/2021 0030   APPEARANCEUR CLOUDY (A) 12/19/2021 0030   APPEARANCEUR Cloudy (A) 12/30/2019 1439   LABSPEC 1.020 12/19/2021 0030   LABSPEC 1.024 01/27/2014 1350   PHURINE 5.5 12/19/2021 0030   GLUCOSEU NEGATIVE 12/19/2021 0030   GLUCOSEU >=500 01/27/2014 1350   HGBUR LARGE (A) 12/19/2021 0030   BILIRUBINUR NEGATIVE 12/19/2021 0030   BILIRUBINUR Negative 12/30/2019 1439   BILIRUBINUR Negative 01/27/2014  [place] (A) 12/19/2021 0030   PROTEINUR >300 (A) 12/19/2021 0030   UROBILINOGEN 0.2 12/17/2017 1524   NITRITE NEGATIVE 12/19/2021 0030   LEUKOCYTESUR LARGE (A) 12/19/2021 0030   LEUKOCYTESUR 2+ 01/27/2014 1350   Sepsis Labs: _0 (procalcitonin:4,lacticidven:4)  ) Recent Results (from the past 240 hour(s))  Resp Panel by RT-PCR (Flu A&B, Covid) Nasopharyngeal Swab     Status: None   Collection Time: 12/19/21 11:00 PM   Specimen: Nasopharyngeal Swab; Nasopharyngeal(NP) swabs in vial transport medium  Result Value Ref Range Status   SARS Coronavirus 2 by RT PCR NEGATIVE NEGATIVE Final    Comment: (NOTE) SARS-CoV-2 target nucleic acids are NOT DETECTED.  The SARS-CoV-2 RNA is generally detectable in upper respiratory specimens during the acute phase of infection. The lowest concentration of SARS-CoV-2 viral copies this assay  can detect is 138 copies/mL. A negative result does not preclude SARS-Cov-2 infection and should not be used as the sole basis for treatment or other patient management decisions. A negative result may occur with  improper specimen collection/handling, submission of specimen other than nasopharyngeal swab, presence of viral mutation(s) within the areas targeted by this assay, and inadequate number of viral copies(<138 copies/mL). A negative result must be combined with clinical observations, patient history, and epidemiological information. The expected result is Negative.  Fact Sheet for Patients:  EntrepreneurPulse.com.au  Fact Sheet for Healthcare Providers:  IncredibleEmployment.be  This test is no t yet approved or cleared by the [place] FDA and  has been authorized for detection and/or diagnosis of SARS-CoV-2 by FDA under an Emergency Use Authorization (EUA). This EUA will remain  in effect (meaning this test can be used) for the duration of the COVID-19 declaration under Section 564(b)(1) of the Act, 21 U.S.C.section 360bbb-3(b)(1), unless the authorization is terminated  or revoked sooner.       Influenza A by PCR NEGATIVE NEGATIVE Final   Influenza B by PCR NEGATIVE NEGATIVE Final    Comment: (NOTE) The Xpert Xpress SARS-CoV-2/FLU/RSV plus assay is intended as an aid in the diagnosis of influenza from Nasopharyngeal swab specimens and should not be used as a sole basis for treatment. Nasal washings and aspirates are unacceptable for Xpert Xpress SARS-CoV-2/FLU/RSV testing.  Fact Sheet for Patients: EntrepreneurPulse.com.au  Fact Sheet for Healthcare Providers: IncredibleEmployment.be  This test is not yet approved or cleared by the [place] FDA and has been authorized for detection and/or diagnosis of SARS-CoV-2 by FDA under an Emergency Use Authorization (EUA). This EUA will  remain in effect (meaning this test can be used) for the duration of the COVID-19 declaration under Section 564(b)(1) of the Act, 21 U.S.C. section 360bbb-3(b)(1), unless the authorization is terminated or revoked.  Performed at [facility], [address] [place], [place] [address]   Urine Culture     Status: Abnormal   Collection Time: 12/20/21 12:30 AM   Specimen: Urine, Random  Result Value Ref Range Status   Specimen Description   Final    URINE, RANDOM Performed at [facility], [address] [place], [place] [address]    Special Requests   Final    NONE Performed at [facility], [address] [place], [place] [address]    Culture (A)  Final    <10,000 COLONIES/mL INSIGNIFICANT GROWTH Performed at [facility], [place] [address], [place], [place] [address]    Report Status 12/22/2021 FINAL  Final  Urine Culture     Status: Abnormal  Collection Time: 12/22/21  2:20 PM   Specimen: Urine, Clean Catch  Result Value Ref Range Status   Specimen Description   Final    URINE, CLEAN CATCH Performed at [facility], [address], [place], [place] [address]    Special Requests   Final    NONE Performed at [facility], [address] [place], [place] [address]    Culture MULTIPLE SPECIES PRESENT, SUGGEST RECOLLECTION (A)  Final   Report Status 12/23/2021 FINAL  Final      Studies: No results found.  Scheduled Meds:  vitamin C  500 mg Oral BID   buPROPion  100 mg Oral Q1200   busPIRone  5 mg Oral Daily   cloNIDine  0.1 mg Oral BID   docusate sodium  200 mg Oral BID   [START ON 12/29/2021] enoxaparin (LOVENOX) injection  30 mg Subcutaneous Q24H   feeding supplement  237 mL Oral TID BM   insulin aspart  0-15 Units Subcutaneous TID WC   insulin aspart  0-5 Units Subcutaneous QHS   insulin aspart  3 Units Subcutaneous TID WC   insulin detemir  15 Units Subcutaneous Q24H   metoprolol tartrate  50 mg Oral BID    multivitamin with minerals  1 tablet Oral Daily   polyethylene glycol  17 g Oral Daily   zinc sulfate  220 mg Oral Daily    Continuous Infusions:  lactated ringers 50 mL/hr at 12/28/21 1349     LOS: 9 days     Kayleen Memos, MD [facility] Pager [phone]  If 7PM-7AM, please contact night-coverage [email or web address] Password [facility] 12/28/2021, 2:57 PM

## 2021-12-29 LAB — COMPREHENSIVE METABOLIC PANEL
ALT: 13 U/L (ref 0–44)
AST: 16 U/L (ref 15–41)
Albumin: 2.6 g/dL — ABNORMAL LOW (ref 3.5–5.0)
Alkaline Phosphatase: 135 U/L — ABNORMAL HIGH (ref 38–126)
Anion gap: 9 (ref 5–15)
BUN: 38 mg/dL — ABNORMAL HIGH (ref 8–23)
CO2: 22 mmol/L (ref 22–32)
Calcium: 9.4 mg/dL (ref 8.9–10.3)
Chloride: 101 mmol/L (ref 98–111)
Creatinine, Ser: 1.69 mg/dL — ABNORMAL HIGH (ref 0.44–1.00)
GFR, Estimated: 31 mL/min — ABNORMAL LOW (ref >60)
Glucose, Bld: 230 mg/dL — ABNORMAL HIGH (ref 70–99)
Potassium: 4.7 mmol/L (ref 3.5–5.1)
Sodium: 132 mmol/L — ABNORMAL LOW (ref 135–145)
Total Bilirubin: 0.3 mg/dL (ref 0.3–1.2)
Total Protein: 6.2 g/dL — ABNORMAL LOW (ref 6.5–8.1)

## 2021-12-29 LAB — CBC
HCT: 29.9 % — ABNORMAL LOW (ref 36.0–46.0)
Hemoglobin: 9.3 g/dL — ABNORMAL LOW (ref 12.0–15.0)
MCH: 26 pg (ref 26.0–34.0)
MCHC: 31.1 g/dL (ref 30.0–36.0)
MCV: 83.5 fL (ref 80.0–100.0)
Platelets: 296 10*3/uL (ref 150–400)
RBC: 3.58 MIL/uL — ABNORMAL LOW (ref 3.87–5.11)
RDW: 17.1 % — ABNORMAL HIGH (ref 11.5–15.5)
WBC: 5.9 10*3/uL (ref 4.0–10.5)
nRBC: 0 % (ref 0.0–0.2)

## 2021-12-29 LAB — MAGNESIUM: Magnesium: 1.7 mg/dL (ref 1.7–2.4)

## 2021-12-29 LAB — PHOSPHORUS: Phosphorus: 2.8 mg/dL (ref 2.5–4.6)

## 2021-12-29 LAB — C-REACTIVE PROTEIN: CRP: 5.7 mg/dL — ABNORMAL HIGH (ref <1.0)

## 2021-12-29 LAB — LACTIC ACID, PLASMA: Lactic Acid, Venous: 1.6 mmol/L (ref 0.5–1.9)

## 2021-12-29 LAB — GLUCOSE, CAPILLARY
Glucose-Capillary: 212 mg/dL — ABNORMAL HIGH (ref 70–99)
Glucose-Capillary: 153 mg/dL — ABNORMAL HIGH (ref 70–99)

## 2021-12-29 LAB — SEDIMENTATION RATE: Sed Rate: 83 mm/hr — ABNORMAL HIGH (ref 0–30)

## 2021-12-29 MED ORDER — OXYCODONE-ACETAMINOPHEN 5-325 MG PO TABS: 1.0000 | ORAL_TABLET | Freq: Four times a day (QID) | ORAL | 0 refills | Status: AC | PRN

## 2021-12-29 MED ORDER — ENOXAPARIN SODIUM 30 MG/0.3ML IJ SOSY: 30.0000 mg | PREFILLED_SYRINGE | INTRAMUSCULAR | Status: DC

## 2021-12-29 MED ORDER — ENSURE ENLIVE PO LIQD: 237.0000 mL | Freq: Three times a day (TID) | ORAL | 0 refills | Status: AC

## 2021-12-29 MED ORDER — ENOXAPARIN SODIUM 40 MG/0.4ML IJ SOSY
40.0000 mg | PREFILLED_SYRINGE | INTRAMUSCULAR | Status: DC
  Administered 2021-12-29: 40 mg via SUBCUTANEOUS
  Filled 2021-12-29: qty 0.4

## 2021-12-29 MED ORDER — ASCORBIC ACID 500 MG PO TABS: 500.0000 mg | ORAL_TABLET | Freq: Two times a day (BID) | ORAL | 0 refills | Status: AC

## 2021-12-29 MED ORDER — ZINC SULFATE 220 (50 ZN) MG PO CAPS: 220.0000 mg | ORAL_CAPSULE | Freq: Every day | ORAL | 0 refills | Status: AC

## 2021-12-29 NOTE — Plan of Care (Signed)
VSS, patient alert and oriented, IV removed, wound vac changed to home canister, transported home vis EMS

## 2021-12-29 NOTE — Consult Note (Addendum)
Subjective:   CC: sacral wound  HPI:  Heather Lopez is a 76 y.o. female who was consulted by [facility] for evaluation of above.  Previously seen for same issue.  Noted to have increased pain in that area at time of admission, but patient and husband believe the increased pain was due to long time using wheelchair that day trying to make it to various appointments.  Pt currently states no worsening pain or other concerns in the area.   Past Medical History:  has a past medical history of Anxiety, Cataract, Depression, Diabetes mellitus without complication ([place]), Gout, History of kidney stones, Hyperlipidemia, Hypertension, and Vertigo.  Past Surgical History:  has a past surgical history that includes Tubal ligation (1992); Colonoscopy with propofol (N/A, 08/19/2015); polypectomy (08/19/2015); Eye surgery; Cataract extraction, bilateral (Bilateral, 2019); Cystoscopy with biopsy (N/A, 12/22/2019); Cystoscopy w/ retrogrades (Bilateral, 12/22/2019); Cystoscopy with ureteroscopy (Bilateral, 12/22/2019); Cystoscopy with stent placement (Right, 12/22/2019); Cystoscopy/ureteroscopy/holmium laser/stent placement (Right, 12/22/2019); DIALYSIS/PERMA CATHETER INSERTION (N/A, 06/29/2021); laparotomy (N/A, 06/29/2021); Cystoscopy w/ ureteral stent placement (Bilateral, 07/05/2021); DIALYSIS/PERMA CATHETER INSERTION (N/A, 07/13/2021); IR GASTROSTOMY TUBE MOD SED (08/01/2021); Cystoscopy/ureteroscopy/holmium laser/stent placement (Bilateral, 12/08/2021); and Cystoscopy w/ ureteral stent placement (Bilateral, 12/26/2021).  Family History: family history includes Cancer in her father; Diabetes in her brother and father; Stroke in her mother.  Social History:  reports that she quit smoking about 50 years ago. Her smoking use included cigarettes. She has a 4.50 pack-year smoking history. She has never used smokeless tobacco. She reports that she does not drink alcohol and does not use drugs.  Current Medications:  Prior to Admission  medications   Medication Sig Start Date End Date Taking? Authorizing Provider  aspirin 81 MG EC tablet Take by mouth.   Yes [provider]  buPROPion (WELLBUTRIN) 100 MG tablet Take 100 mg by mouth daily.   Yes [provider]  busPIRone (BUSPAR) 5 MG tablet Place 5 mg into feeding tube daily.   Yes [provider]  insulin aspart (NOVOLOG) 100 UNIT/ML injection Inject 0-10 Units into the skin 3 (three) times daily before meals.   Yes [provider]  insulin detemir (LEVEMIR) 100 UNIT/ML injection Inject 0.3 mLs (30 Units total) into the skin 2 (two) times daily. 08/03/21  Yes Sharen Hones, MD  metoprolol tartrate (LOPRESSOR) 100 MG tablet Take 1 tablet (100 mg total) by mouth 2 (two) times daily. 12/11/21  Yes Fritzi Mandes, MD  Multiple Vitamin (MULTIVITAMIN WITH MINERALS) TABS tablet Take 1 tablet by mouth daily. 12/12/21  Yes Fritzi Mandes, MD  atorvastatin (LIPITOR) 20 MG tablet Take 1 tablet (20 mg total) by mouth daily. Patient not taking: Reported on 12/20/2021 04/15/18   Mikey College, NP  cloNIDine (CATAPRES) 0.1 MG tablet Take 1 tablet (0.1 mg total) by mouth 2 (two) times daily. 12/11/21   Fritzi Mandes, MD  fluticasone (FLONASE) 50 MCG/ACT nasal spray Place 1 spray into both nostrils daily.    [provider]  iron polysaccharides (NIFEREX) 150 MG capsule Take 1 capsule (150 mg total) by mouth daily. 12/11/21   Fritzi Mandes, MD  loperamide (IMODIUM A-D) 2 MG tablet Take 2 mg by mouth every 6 (six) hours as needed for diarrhea or loose stools.    [provider]  oxyCODONE-acetaminophen (PERCOCET/ROXICET) 5-325 MG tablet Take 1 tablet by mouth every 6 (six) hours as needed for moderate pain or severe pain. 12/11/21   Fritzi Mandes, MD  polyethylene glycol (MIRALAX / GLYCOLAX) 17 g packet Take 17  g by mouth daily.    [provider]  senna-docusate (SENOKOT-S) 8.6-50 MG tablet Take 1 tablet by mouth 2 (two) times daily.    [provider]  sitaGLIPtin (JANUVIA) 25 MG tablet Place 1 tablet (25 mg total) into feeding tube daily. Patient taking differently: Take 25 mg by mouth daily. 12/11/21   Fritzi Mandes, MD    Allergies:  Allergies  Allergen Reactions   Contrast Media  [Iodinated Contrast Media] Anaphylaxis   Iodine Swelling    (IV only) - angioedema    ROS:  General: Denies weight loss, weight gain, fatigue, fevers, chills, and night sweats. Eyes: Denies blurry vision, double vision, eye pain, itchy eyes, and tearing. Ears: Denies hearing loss, earache, and ringing in ears. Nose: Denies sinus pain, congestion, infections, runny nose, and nosebleeds. Mouth/throat: Denies hoarseness, sore throat, bleeding gums, and difficulty swallowing. Heart: Denies chest pain, palpitations, racing heart, irregular heartbeat, leg pain or swelling, and decreased activity tolerance. Respiratory: Denies breathing difficulty, shortness of breath, wheezing, cough, and sputum. GI: Denies change in appetite, heartburn, nausea, vomiting, constipation, diarrhea, and blood in stool. GU: Denies difficulty urinating, pain with urinating, urgency, frequency, blood in urine. Musculoskeletal: Denies joint stiffness, pain, swelling, muscle weakness. Skin: Denies rash, itching, mass, tumors, sores, and boils Neurologic: Denies headache, fainting, dizziness, seizures, numbness, and tingling. Psychiatric: Denies depression, anxiety, difficulty sleeping, and memory loss. Endocrine: Denies heat or cold intolerance, and increased thirst or urination. Blood/lymph: Denies easy bruising, easy bruising, and swollen glands     Objective:     BP (!) 147/55 (BP Location: Right Arm)    Pulse 66    Temp 98.6 F (37 C) (Oral)    Resp 20    Ht 5\' 3"  (1.6 m)    Wt 83.4 kg    SpO2 98%    BMI 32.57 kg/m   Constitutional :  alert, cooperative, appears stated age, and no distress  Lymphatics/Throat:  no asymmetry, masses, or scars  Respiratory:   clear to auscultation bilaterally  Cardiovascular:  regular rate and rhythm  Gastrointestinal: soft, non-tender; bowel sounds normal; no masses,  no organomegaly.  Musculoskeletal: Steady gait and movement  Skin: Cool and moist, 12mm deep x 1 cm wide x 2.5 cm long sacral wound as noted below.  Much improved since last exam.  No evidence of drainage, induration, visible bone to indicate active infection.  Psychiatric: Normal affect, non-agitated, not confused          LABS:  CMP Latest Ref Rng & Units 12/29/2021 12/27/2021 12/26/2021  Glucose 70 - 99 mg/dL 230(H) 231(H) 300(H)  BUN 8 - 23 mg/dL 38(H) 35(H) 32(H)  Creatinine 0.44 - 1.00 mg/dL 1.69(H) 2.15(H) 1.86(H)  Sodium 135 - 145 mmol/L 132(L) 130(L) 133(L)  Potassium 3.5 - 5.1 mmol/L 4.7 4.4 4.2  Chloride 98 - 111 mmol/L 101 102 103  CO2 22 - 32 mmol/L 22 23 22   Calcium 8.9 - 10.3 mg/dL 9.4 9.2 9.2  Total Protein 6.5 - 8.1 g/dL 6.2(L) - -  Total Bilirubin 0.3 - 1.2 mg/dL 0.3 - -  Alkaline Phos 38 - 126 U/L 135(H) - -  AST 15 - 41 U/L 16 - -  ALT 0 - 44 U/L 13 - -   CBC Latest Ref Rng & Units 12/29/2021 12/27/2021 12/26/2021  WBC 4.0 - 10.5 K/uL 5.9 6.0 5.6  Hemoglobin 12.0 - 15.0 g/dL 9.3(L) 9.3(L) 9.7(L)  Hematocrit 36.0 - 46.0 % 29.9(L) 30.2(L) 31.6(L)  Platelets 150 -  400 K/uL 296 279 268    RADS: N/a  Assessment:     Chronic sacral wound, continuing on wound vac therapy.  Wound continues to look well.   Plan:     Discussed r/b/a agin of wound vac therapy.  Hope is irritation from tubing can be improved by continuing with bridging method.  Pt still will like to continue with wound vac therapy for now.  After removal of old wound vac, small black sponge cut to size to cover granulated tissue area, and secured into place with air tight seal.  Longer thin black sponge bridge then applied over wound sponge and secured into place with end towards side of right hip.  Opening created and vac attached to bridge sponge and placed on  166mm Hg. Minimal leak noted.  Above dressing change done personally by myself using DME wound vac  Recommend continuing changes per WOC.  No need for biopsy/debridement/surgical intervention at this time due to any obvious sign of infection. Surgery to sign off.  Please call with additional questions or concerns.  Recommend she continue to establish care with outpt woundcare center so she can continue with care once discharged from hospital.  Surgery office does not have capability to monitor and treat long term wounds such as this case.

## 2021-12-29 NOTE — Discharge Summary (Addendum)
Discharge Summary  Heather Lopez QQP:[id number] DOB: Nov 16, 1946  PCP: [name], PA-C  Admit date: 12/19/2021 Discharge date: 12/29/2021  Time spent: 35 minutes   Recommendations for Outpatient Follow-up:  Continue local wound care.  Home health RN for wound care will start on Monday 01/01/22. Follow up with your PCP. Follow up with urology. Take your medications as prescribed.  Discharge Diagnoses:  Active Hospital Problems   Diagnosis Date Noted   AKI (acute kidney injury) ([place]) 12/19/2021   Decubitus ulcer of sacral region, stage 4 ([place]) 12/19/2021   Lactic acidosis 12/19/2021   Status post placement of ureteral stent 12/09/2021 12/19/2021   Chronic osteomyelitis of coccyx ([place]) 12/02/2021   Essential hypertension    Acute on chronic anemia 07/07/2021   Bilateral nephrolithiasis 07/07/2021   Acute kidney injury superimposed on CKD ([place]) 06/27/2021   Diabetes mellitus, type II ([place]) 03/03/2018   Depression 07/12/2015    Resolved Hospital Problems  No resolved problems to display.    Discharge Condition: Stable.  Diet recommendation: Resume previous diet.  Vitals:   12/28/21 2026 12/29/21 0505  BP: (!) 161/55 (!) 147/55  Pulse: 69 66  Resp: 16 20  Temp: 97.6 F (36.4 C) 98.6 F (37 C)  SpO2: 98% 98%    History of present illness:   Heather Lopez is a 76 y.o. 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  She was on prolonged IV antibiotics (6 weeks) and oral antibiotics for her chronic sacral osteomyelitis and was seen by infectious disease.  Upon presentation to the ER, was found to have Cr 2.35, up from baseline 1.3.  Work-up revealed nonoliguric AKI and chronic sacral wound since September 2022, seen by ID at her last admission where she received IV antibiotics for her sacral osteomyelitis.  Post wound VAC placement.   12/29/2021: Patient was seen at her bedside.  There were no acute events overnight.  She was seen by general surgery plan for debridement.  Recommended to continue local wound care.  Hospital Course:  Principal Problem:   AKI (acute kidney injury) ([place]) Active Problems:   Depression   Diabetes mellitus, type II ([place])   Acute kidney injury superimposed on CKD ([place])   Bilateral nephrolithiasis   Acute on chronic anemia   Essential hypertension   Chronic osteomyelitis of coccyx (HCC)   Decubitus ulcer of sacral region, stage 4 (HCC)   Lactic acidosis   Status post placement of ureteral stent 12/09/2021  Nonoliguric AKI (acute kidney injury) ([place]) on CKD 3B- (present on admission), unclear etiology. Creatinine is uptrending despite IV fluid hydration, 2.15 from 1.86. Baseline creatinine appears to be 1.5 with GFR of 35. Creatinine 1.6 with GFR of 31 on 12/29/2021. Continue to avoid nephrotoxic agents, dehydration and hypotension. Follow-up with your PCP.   Chronic sacral wound on wound VAC therapy/chronic sacral osteomyelitis, previously treated. Follows with wound care, first appointment at the wound care clinic on Monday 01/01/22. Continue pain management and bowel regimen. Continue local  wound care   Bilateral nephrolithiasis S/p ureteral stent removal   Essential hypertension- (present on admission) Blood pressure normal - Continue home clonidine and metoprolol   Chronic blood loss anemia from chronic wound Hgb stable 9.3K MCV 83    Diabetes mellitus, type II (HCC) Resume home regimen   Depression- (present on admission) -Continue BuSpar, Wellbutrin   Physical debility Continue fall precautions      Code Status: DNR      Consultants: General surgery   Procedures: Wound Vac replacement on 12/29/21   Antimicrobials: None    Discharge Exam: BP (!) 147/55 (BP Location: Right Arm)    Pulse 66    Temp 98.6 F (37 C) (Oral)    Resp 20    Ht 5\' 3"  (1.6 m)    Wt 83.4 kg    SpO2 98%    BMI 32.57 kg/m  General: 76 y.o. year-old female well developed well nourished in no acute distress.  Alert and oriented x3. Cardiovascular: Regular rate and rhythm with no rubs or gallops.  No thyromegaly or JVD noted.   Respiratory: Clear to auscultation with no wheezes or rales. Good inspiratory effort. Abdomen: Soft nontender nondistended with normal bowel sounds x4 quadrants. Musculoskeletal: Trace lower extremity edema.  Psychiatry: Mood is appropriate for condition and setting  Discharge Instructions You were cared for by a hospitalist during your hospital stay. If you have any questions about your discharge medications or the care you received while you were in the hospital after you are discharged, you can call the unit and asked to speak with the hospitalist on call if the hospitalist that took care of you is not available. Once you are discharged, your primary care physician will handle any further medical issues. Please note that NO REFILLS for any discharge medications will be authorized once you are discharged, as it is imperative that you return to your primary care physician (or establish a relationship with a primary care physician if you do not have one) for your aftercare needs so that they can reassess your need for medications and monitor your lab values.   Allergies as of 12/29/2021       Reactions   Contrast Media  [iodinated Contrast Media] Anaphylaxis   Iodine Swelling   (IV only) - angioedema         Medication List     STOP taking these medications    atorvastatin 20 MG tablet Commonly known as: LIPITOR   nystatin powder Commonly known as: MYCOSTATIN/NYSTOP       TAKE these medications    ascorbic acid 500 MG tablet Commonly known as: VITAMIN C Take 1 tablet (500 mg total) by mouth 2 (two) times daily.   aspirin 81 MG EC tablet Take by mouth.   buPROPion 100 MG tablet Commonly known as: WELLBUTRIN Take 100 mg by mouth daily.   busPIRone 5 MG tablet Commonly known as: BUSPAR Place 5 mg into feeding tube daily.   cloNIDine 0.1 MG tablet Commonly known as: CATAPRES Take 1 tablet (0.1 mg total) by mouth 2 (two) times daily.   feeding supplement Liqd Take 237 mLs by mouth 3 (three) times daily between meals for 7 days.   fluticasone 50 MCG/ACT nasal spray Commonly known as: FLONASE Place 1 spray into both nostrils daily.   insulin aspart 100 UNIT/ML injection Commonly known as: novoLOG Inject 0-10 Units into the skin 3 (three) times daily before meals.   insulin detemir 100 UNIT/ML injection Commonly known as:  LEVEMIR Inject 0.3 mLs (30 Units total) into the skin 2 (two) times daily.   iron polysaccharides 150 MG capsule Commonly known as: NIFEREX Take 1 capsule (150 mg total) by mouth daily.   loperamide 2 MG tablet Commonly known as: IMODIUM A-D Take 2 mg by mouth every 6 (six) hours as needed for diarrhea or loose stools.   metoprolol tartrate 100 MG tablet Commonly known as: LOPRESSOR Take 1 tablet (100 mg total) by mouth 2 (two) times daily.   multivitamin with minerals Tabs tablet Take 1 tablet by mouth daily.   oxyCODONE-acetaminophen 5-325 MG tablet Commonly known as: PERCOCET/ROXICET Take 1 tablet by mouth every 6 (six) hours as needed for up to 3 days for severe pain. What changed: reasons to take this   polyethylene glycol 17 g packet Commonly known as: MIRALAX / GLYCOLAX Take 17 g by mouth daily.   senna-docusate 8.6-50 MG  tablet Commonly known as: Senokot-S Take 1 tablet by mouth 2 (two) times daily.   sitaGLIPtin 25 MG tablet Commonly known as: JANUVIA Place 1 tablet (25 mg total) into feeding tube daily. What changed: how to take this   zinc sulfate 220 (50 Zn) MG capsule Take 1 capsule (220 mg total) by mouth daily. Start taking on: December 30, 2021       Allergies  Allergen Reactions   Contrast Media  [Iodinated Contrast Media] Anaphylaxis   Iodine Swelling    (IV only) - angioedema    Follow-up Information     [place]. Go on 01/01/2022.   Specialty: Wound Care Why: Go to appointment on 01/01/22 Contact information: [address] [id number] [place] [phone]        [name], PA-C. Call today.   Specialty: Physician Assistant Why: Please call for a posthospital follow-up appointment. Contact information: [place] [address] [phone]         Abbie Sons, MD. Call today.   Specialty: Urology Why: Please call for a posthospital follow-up appointment. Contact information: [place] [address] [phone]                  The results of significant diagnostics from this hospitalization (including imaging, microbiology, ancillary and laboratory) are listed below for reference.    Significant Diagnostic Studies: CT ABDOMEN PELVIS WO CONTRAST  Result Date: 12/02/2021 CLINICAL DATA:  76 year old female with abdominal pain and fever. History of pararenal abscess status post CT-guided drainage in September. EXAM: CT ABDOMEN AND PELVIS WITHOUT CONTRAST TECHNIQUE: Multidetector CT imaging of the abdomen and pelvis was performed following the standard protocol without IV contrast. COMPARISON:  CT Abdomen 11/07/2021 and earlier. FINDINGS: Lower chest: Mild left lung base fibrothorax is stable. No pericardial or pleural effusion. Negative right  lung base. Hepatobiliary: Distended gallbladder but no pericholecystic inflammation. Negative noncontrast liver. Pancreas: Negative. Spleen: Negative. Adrenals/Urinary Tract: Adrenal glands remain within normal limits. Enlarged bilateral renal collecting systems since last month with mild to moderate left and moderate to severe right hydronephrosis despite bilateral double-J ureteral stents still in place. And there is right hydroureter and periureteral stranding along the course of the right stent. Underlying bilateral nephrolithiasis is stable. Ureteral stent positioning appears adequate. And no calculus is identified along the course of either stent. However, the bladder is both distended and inflamed (series 2, image 71) with trace gas also within the bladder (same image). Perivesical inflammatory stranding is at least moderate (  coronal image 49). Stomach/Bowel: Retained stool in the large bowel. Sigmoid diverticulosis. No convincing large bowel inflammation. Normal appendix visible on coronal image 54. Decompressed terminal ileum. No dilated small bowel. Fairly decompressed stomach and duodenum. No free air. No free fluid. Vascular/Lymphatic: Aortoiliac calcified atherosclerosis. Normal caliber abdominal aorta. Vascular patency is not evaluated in the absence of IV contrast. No lymphadenopathy identified. Reproductive: Stable and within normal limits. Other: Presacral inflammatory stranding is mild and stable to decreased since September. Musculoskeletal: Advanced degeneration in the spine. Very severe degeneration of the right hip. Since September there is a new sacral decubitus wound with soft tissue ulceration to the tip of the coccyx (series 2, image 87). And a subtle new sclerosis and cortical erosion of the dorsal coccyx on sagittal image 68 in keeping with coccygeal osteomyelitis. Soft tissue thickening in the region but no organized or drainable fluid collection. IMPRESSION: 1. Since September there is  a new Sacral Decubitus Wound with Coccygeal Osteomyelitis. No organized or drainable fluid collection there. 2. New bilateral hydronephrosis and hydroureter, right > left, despite bilateral double-J ureteral stents with adequate positioning. And the urinary bladder is also Dilated And Inflamed, with trace gas in the suspicious for Gas-forming Urinary Infection unless explained by recent catheterization. Bilateral nephrolithiasis unchanged from last month. 3. No bowel inflammation or obstruction.  Normal appendix. 4. Aortic Atherosclerosis (ICD10-I70.0). Severe right hip degeneration. Electronically Signed   By: Genevie Lavette M.D.   On: 12/02/2021 06:25   US RENAL  Result Date: 12/03/2021 CLINICAL DATA:  Follow-up hydronephrosis seen on a CT scan from December 02, 2021. EXAM: RENAL / URINARY TRACT ULTRASOUND COMPLETE COMPARISON:  CT scan December 02, 2021 FINDINGS: Right Kidney: Renal measurements: 11.7 x 5.8 x 5.4 cm = volume: 191 mL. Hydronephrosis has resolved. Left Kidney: Renal measurements: 10.4 x 5.2 x 5.8 cm = volume: 165 mL. Hydronephrosis has resolved. Bladder: The bladder is decompressed with a Foley catheter. Other: None. IMPRESSION: Bilateral hydronephrosis has resolved in the interval. The bladder is decompressed with a Foley catheter. Electronically Signed   By: Dorise Bullion III M.D.   On: 12/03/2021 17:46   DG Chest Port 1 View  Result Date: 12/25/2021 CLINICAL DATA:  A 76 year old female presents with shortness of breath. EXAM: PORTABLE CHEST 1 VIEW COMPARISON:  December 03, 2021 FINDINGS: PICC line has been removed. Cardiomediastinal contours and hilar structures are stable. No lobar level consolidation. Signs of airspace disease at the LEFT lung base grossly similar terms of morphology, slightly increased lateral to the LEFT heart border since the previous study. Blunting of the LEFT costodiaphragmatic sulcus. On limited assessment there is no acute skeletal process. IMPRESSION: 1. Signs of airspace  disease at the LEFT lung base grossly similar terms of morphology, slightly increased lateral to the LEFT heart border since the previous study. Findings favored to represent a mixture of atelectasis or scarring. No lobar consolidation. 2. Possible small LEFT effusion. Electronically Signed   By: Zetta Bills M.D.   On: 12/25/2021 09:19   DG Chest Port 1 View  Result Date: 12/03/2021 CLINICAL DATA:  76 year old female PICC line placement. EXAM: PORTABLE CHEST 1 VIEW COMPARISON:  Portable chest 08/01/2021. FINDINGS: Portable AP view at 0951 hours. Right upper extremity approach PICC line catheter tip terminates at the superior right mediastinum, superior SVC level above the carina. Other lines and tubes removed since September. Larger lung volumes. Streaky linear opacity in the left mid lung most resembles atelectasis. No pneumothorax or pulmonary edema. Normal cardiac  size and mediastinal contours. Visualized tracheal air column is within normal limits. Negative visible bowel gas. No acute osseous abnormality identified. IMPRESSION: 1. Right upper extremity approach PICC line catheter tip at the superior SVC level. 2. Larger lung volumes with left lung atelectasis versus scarring. Electronically Signed   By: Genevie Tommie M.D.   On: 12/03/2021 10:20   DG OR UROLOGY CYSTO IMAGE ([facility] ONLY)  Result Date: 12/08/2021 There is no interpretation for this exam.  This order is for images obtained during a surgical procedure.  Please See "Surgeries" Tab for more information regarding the procedure.    Microbiology: Recent Results (from the past 240 hour(s))  Resp Panel by RT-PCR (Flu A&B, Covid) Nasopharyngeal Swab     Status: None   Collection Time: 12/19/21 11:00 PM   Specimen: Nasopharyngeal Swab; Nasopharyngeal(NP) swabs in vial transport medium  Result Value Ref Range Status   SARS Coronavirus 2 by RT PCR NEGATIVE NEGATIVE Final    Comment: (NOTE) SARS-CoV-2 target nucleic acids are NOT DETECTED.  The  SARS-CoV-2 RNA is generally detectable in upper respiratory specimens during the acute phase of infection. The lowest concentration of SARS-CoV-2 viral copies this assay can detect is 138 copies/mL. A negative result does not preclude SARS-Cov-2 infection and should not be used as the sole basis for treatment or other patient management decisions. A negative result may occur with  improper specimen collection/handling, submission of specimen other than nasopharyngeal swab, presence of viral mutation(s) within the areas targeted by this assay, and inadequate number of viral copies(<138 copies/mL). A negative result must be combined with clinical observations, patient history, and epidemiological information. The expected result is Negative.  Fact Sheet for Patients:  EntrepreneurPulse.com.au  Fact Sheet for Healthcare Providers:  IncredibleEmployment.be  This test is no t yet approved or cleared by the [place] FDA and  has been authorized for detection and/or diagnosis of SARS-CoV-2 by FDA under an Emergency Use Authorization (EUA). This EUA will remain  in effect (meaning this test can be used) for the duration of the COVID-19 declaration under Section 564(b)(1) of the Act, 21 U.S.C.section 360bbb-3(b)(1), unless the authorization is terminated  or revoked sooner.       Influenza A by PCR NEGATIVE NEGATIVE Final   Influenza B by PCR NEGATIVE NEGATIVE Final    Comment: (NOTE) The Xpert Xpress SARS-CoV-2/FLU/RSV plus assay is intended as an aid in the diagnosis of influenza from Nasopharyngeal swab specimens and should not be used as a sole basis for treatment. Nasal washings and aspirates are unacceptable for Xpert Xpress SARS-CoV-2/FLU/RSV testing.  Fact Sheet for Patients: EntrepreneurPulse.com.au  Fact Sheet for Healthcare Providers: IncredibleEmployment.be  This test is not yet approved or  cleared by the [place] FDA and has been authorized for detection and/or diagnosis of SARS-CoV-2 by FDA under an Emergency Use Authorization (EUA). This EUA will remain in effect (meaning this test can be used) for the duration of the COVID-19 declaration under Section 564(b)(1) of the Act, 21 U.S.C. section 360bbb-3(b)(1), unless the authorization is terminated or revoked.  Performed at [facility], [address], [place], [place] [address]   Urine Culture     Status: Abnormal   Collection Time: 12/20/21 12:30 AM   Specimen: Urine, Random  Result Value Ref Range Status   Specimen Description   Final    URINE, RANDOM Performed at [facility], [address] [place], [place] [address]    Special Requests   Final  NONE Performed at [facility], [address] [place], [place] [address]    Culture (A)  Final    <10,000 COLONIES/mL INSIGNIFICANT GROWTH Performed at [facility] [address] [place], [place] [address]    Report Status 12/22/2021 FINAL  Final  Urine Culture     Status: Abnormal   Collection Time: 12/22/21  2:20 PM   Specimen: Urine, Clean Catch  Result Value Ref Range Status   Specimen Description   Final    URINE, CLEAN CATCH Performed at [facility], [address], [place], [place] [address]    Special Requests   Final    NONE Performed at [facility], [address] [place], [place] [address]    Culture MULTIPLE SPECIES PRESENT, SUGGEST RECOLLECTION (A)  Final   Report Status 12/23/2021 FINAL  Final     Labs: Basic Metabolic Panel: Recent Labs  Lab 12/24/21 0419 12/25/21 0434 12/26/21 0439 12/27/21 0325 12/29/21 0521  NA 134* 134* 133* 130* 132*  K 4.4 4.2 4.2 4.4 4.7  CL 104 107 103 102 101  CO2 20* 21* 22 23 22   GLUCOSE 175* 207* 300* 231* 230*  BUN 23 27* 32* 35* 38*  CREATININE 1.51* 1.95* 1.86* 2.15* 1.69*  CALCIUM 9.3 8.9 9.2 9.2 9.4  MG  --   --   --   --  1.7   PHOS  --   --   --   --  2.8   Liver Function Tests: Recent Labs  Lab 12/29/21 0521  AST 16  ALT 13  ALKPHOS 135*  BILITOT 0.3  PROT 6.2*  ALBUMIN 2.6*   No results for input(s): LIPASE, AMYLASE in the last 168 hours. No results for input(s): AMMONIA in the last 168 hours. CBC: Recent Labs  Lab 12/24/21 0419 12/25/21 0434 12/26/21 0439 12/27/21 0325 12/29/21 0521  WBC 7.0 5.7 5.6 6.0 5.9  HGB 9.8* 9.6* 9.7* 9.3* 9.3*  HCT 31.9* 31.4* 31.6* 30.2* 29.9*  MCV 83.9 82.2 82.1 82.7 83.5  PLT 310 289 268 279 296   Cardiac Enzymes: No results for input(s): CKTOTAL, CKMB, CKMBINDEX, TROPONINI in the last 168 hours. BNP: BNP (last 3 results) Recent Labs    06/27/21 0032  BNP 141.4*    ProBNP (last 3 results) No results for input(s): PROBNP in the last 8760 hours.  CBG: Recent Labs  Lab 12/28/21 1130 12/28/21 1648 12/28/21 2222 12/29/21 0731 12/29/21 1152  GLUCAP 198* 162* 131* 212* 153*       Signed:  Kayleen Memos, MD [facility] 12/29/2021, 12:49 PM

## 2021-12-29 NOTE — Care Management Important Message (Signed)
Important Message  Patient Details  Name: Heather Lopez MRN: [id number] Date of Birth: November 18, 1946   Medicare Important Message Given:  Yes     [name] 12/29/2021, 1:29 PM

## 2021-12-29 NOTE — TOC Transition Note (Signed)
Transition of Care [facility]) - CM/SW Discharge Note   Patient Details  Name: Heather Lopez MRN: [id number] Date of Birth: 29-Apr-1946  Transition of Care [facility]) CM/SW Contact:  Kerin Salen, RN Phone Number: 12/29/2021, 11:46 AM   Clinical Narrative:  To discharge home via EMS today at 2pm. Patient and husband aware. [facility] to provide RN wound care/vac services to begin on Monday. TOC needs resolved.    Final next level of care: [place] Barriers to Discharge: Barriers Resolved   Patient Goals and CMS Choice Patient states their goals for this hospitalization and ongoing recovery are:: To return home.      Discharge Placement                Patient to be transferred to facility by: To transport home via [place]. Name of family member notified: Sherrin Stahle Patient and family notified of of transfer: 12/29/21  Discharge Plan and Services                  DME Agency: NA       HH Arranged: RN [place] Agency: [place] ([facility]) Date [place]: 12/29/21 Time [place]: 1138 Representative spoke with at [place]: [place] ([place]) Interventions     Readmission Risk Interventions Readmission Risk Prevention Plan 12/20/2021 12/03/2021  Transportation Screening Complete Complete  HRI or Home Care Consult Complete -  Social Work Consult for [place] Planning/Counseling Complete -  Palliative Care Screening Complete -  Medication Review Press photographer) Complete Complete  PCP or Specialist appointment within 3-5 days of discharge - Complete  SW Recovery Care/Counseling Consult - Complete  [place] - Complete  Some recent data might be hidden

## 2022-01-01 ENCOUNTER — Telehealth: Payer: Self-pay | Admitting: Internal Medicine

## 2022-01-01 ENCOUNTER — Ambulatory Visit: Payer: Medicare Other | Admitting: Physician Assistant

## 2022-01-01 NOTE — Telephone Encounter (Signed)
Patient last seen by Lauren A with f/u plan of weekly lab/poss Retacrit and f/u with lab/MD/poss Retacrit in 8 weeks.  Do you advise pt to keep f/u apps at [facility] at this time especially since she is having difficulty walking?

## 2022-01-01 NOTE — Telephone Encounter (Signed)
Spoke to Mr. Heather Lopez and explained to him Heather Lopez was coming on a weekly bases to make sure hemoglobin did not drop and if she needed to receive Retacrit injection.  Mr. Heather Lopez is aware that the appt that is cancelled tomorrow was for lab/MD/poss Retacrit.  He will call the office back when Heather Lopez will be able to attend visit.

## 2022-01-01 NOTE — Telephone Encounter (Signed)
Pt husband Remo Lipps called to cancel her appts for today. Pt was just discharged from the hospital and he would like to know if she needs to continue coming to these appts. He is having transportation issues and the patient is also no longer able to walk on her own. He's not fully understanding why it's necessary for her to have all these appts weekly. He would like a call to discuss.

## 2022-01-02 ENCOUNTER — Inpatient Hospital Stay: Payer: Medicare Other | Admitting: Internal Medicine

## 2022-01-02 ENCOUNTER — Inpatient Hospital Stay: Payer: Medicare Other

## 2022-01-05 ENCOUNTER — Telehealth: Payer: Self-pay | Admitting: Nurse Practitioner

## 2022-01-05 NOTE — Telephone Encounter (Signed)
Spoke with patient's husband Heather Lopez regarding the Palliative referral/services and he was reluctant at first and didn't feel that she needed our services because she was already receiving the home health services.  After explaining to him the differences between the two programs, he was a little more opened to the idea but still wanted to talk with the patient about this first.  I told him I would email them the Palliative Brochure for them to review and he was appreciative of this and said that he would call me back after they had discussed this and reviewed the brochure.

## 2022-01-16 ENCOUNTER — Encounter: Payer: Medicare Other | Attending: Physician Assistant | Admitting: Physician Assistant

## 2022-01-16 ENCOUNTER — Other Ambulatory Visit: Payer: Self-pay

## 2022-01-16 DIAGNOSIS — Z87891 Personal history of nicotine dependence: Secondary | ICD-10-CM | POA: Diagnosis not present

## 2022-01-16 DIAGNOSIS — K589 Irritable bowel syndrome without diarrhea: Secondary | ICD-10-CM | POA: Diagnosis not present

## 2022-01-16 DIAGNOSIS — E11622 Type 2 diabetes mellitus with other skin ulcer: Secondary | ICD-10-CM | POA: Diagnosis not present

## 2022-01-16 DIAGNOSIS — N183 Chronic kidney disease, stage 3 unspecified: Secondary | ICD-10-CM | POA: Insufficient documentation

## 2022-01-16 DIAGNOSIS — M1990 Unspecified osteoarthritis, unspecified site: Secondary | ICD-10-CM | POA: Diagnosis not present

## 2022-01-16 DIAGNOSIS — I129 Hypertensive chronic kidney disease with stage 1 through stage 4 chronic kidney disease, or unspecified chronic kidney disease: Secondary | ICD-10-CM | POA: Diagnosis not present

## 2022-01-16 DIAGNOSIS — E1122 Type 2 diabetes mellitus with diabetic chronic kidney disease: Secondary | ICD-10-CM | POA: Diagnosis not present

## 2022-01-16 DIAGNOSIS — L89154 Pressure ulcer of sacral region, stage 4: Secondary | ICD-10-CM | POA: Diagnosis not present

## 2022-01-16 NOTE — Progress Notes (Signed)
Pillars, Amarrah E. ([id number]) Visit Report for 01/16/2022 Abuse Risk Screen Details Patient Name: Tindall, Sylva E. Date of Service: 01/16/2022 12:45 PM Medical Record Number: [id number] Patient Account Number: [id number] Date of Birth/Sex: 11-04-1946 (76 y.o. Female) Treating RN: Levora Dredge Primary Care Jhonnie Aliano: [name] Other Clinician: Referring Euna Armon: Kurtis Bushman Treating Amyr Sluder/Extender: Skipper Cliche in Treatment: 0 Abuse Risk Screen Items Answer ABUSE RISK SCREEN: Has anyone close to you tried to hurt or harm you recentlyo No Do you feel uncomfortable with anyone in your familyo No Has anyone forced you do things that you didnot want to doo No Electronic Signature(s) Signed: 01/16/2022 3:53:43 PM By: Levora Dredge Entered By: Levora Dredge on 01/16/2022 13:08:23 Pellegrin, Adam E. ([id number]) -------------------------------------------------------------------------------- Activities of Daily Living Details Patient Name: Weckerly, Keeva E. Date of Service: 01/16/2022 12:45 PM Medical Record Number: [id number] Patient Account Number: [id number] Date of Birth/Sex: 01-25-1946 (76 y.o. Female) Treating RN: Levora Dredge Primary Care Zakara Parkey: [name] Other Clinician: Referring Bijon Mineer: Kurtis Bushman Treating Clista Rainford/Extender: Skipper Cliche in Treatment: 0 Activities of Daily Living Items Answer Activities of Daily Living (Please select one for each item) Drive Automobile Not Able Take Medications Completely Able Use Telephone Completely Able Care for Appearance Need Assistance Use Toilet Need Assistance Bath / Shower Need Assistance Dress Self Need Assistance Feed Self Completely Able Walk Need Assistance Get In / Out Bed Need Assistance Housework Not Able Prepare Meals Not [place] for Self Not Able Electronic Signature(s) Signed: 01/16/2022 3:53:43 PM By: Levora Dredge Entered By: Levora Dredge on 01/16/2022  13:09:02 Daniel, Esparanza E. ([id number]) -------------------------------------------------------------------------------- Education Screening Details Patient Name: Koenigs, Aron E. Date of Service: 01/16/2022 12:45 PM Medical Record Number: [id number] Patient Account Number: [id number] Date of Birth/Sex: 17-May-1946 (76 y.o. Female) Treating RN: Levora Dredge Primary Care Eduardo Honor: [name] Other Clinician: Referring Delontae Lamm: Kurtis Bushman Treating Kaydence Menard/Extender: Skipper Cliche in Treatment: 0 Learning Preferences/Education Level/Primary Language Learning Preference: Explanation, Demonstration, Video, Communication Board, Printed Material Highest Education Level: College or Above Preferred Language: English Cognitive Barrier Language Barrier: No Translator Needed: No Memory Deficit: No Emotional Barrier: No Cultural/Religious Beliefs Affecting Medical Care: No Physical Barrier Impaired Vision: Yes Glasses Impaired Hearing: No Decreased Hand dexterity: No Knowledge/Comprehension Knowledge Level: High Comprehension Level: High Ability to understand written instructions: High Ability to understand verbal instructions: High Motivation Anxiety Level: Calm Cooperation: Cooperative Education Importance: Acknowledges Need Interest in Health Problems: Asks Questions Perception: Coherent Willingness to Engage in Self-Management High Activities: Readiness to Engage in Self-Management High Activities: Electronic Signature(s) Signed: 01/16/2022 3:53:43 PM By: Levora Dredge Entered By: Levora Dredge on 01/16/2022 13:09:30 Califf, Shaheen E. ([id number]) -------------------------------------------------------------------------------- Fall Risk Assessment Details Patient Name: Denbow, Waylon E. Date of Service: 01/16/2022 12:45 PM Medical Record Number: [id number] Patient Account Number: [id number] Date of Birth/Sex: 08/13/1946 (76 y.o. Female) Treating RN: Levora Dredge Primary Care  Kessler Solly: [name] Other Clinician: Referring Faye Sanfilippo: Kurtis Bushman Treating Doha Boling/Extender: Skipper Cliche in Treatment: 0 Fall Risk Assessment Items Have you had 2 or more falls in the last 12 monthso 0 No Have you had any fall that resulted in injury in the last 12 monthso 0 Yes FALLS RISK SCREEN History of falling - immediate or within 3 months 0 No Secondary diagnosis (Do you have 2 or more medical diagnoseso) 0 No Ambulatory aid None/bed rest/wheelchair/nurse 0 No Crutches/cane/walker 15 Yes Furniture 0 No Intravenous therapy Access/Saline/Heparin Lock 0 No Gait/Transferring Normal/ bed rest/ wheelchair 0 No Weak (short steps with  or without shuffle, stooped but able to lift head while walking, may 10 Yes seek support from furniture) Impaired (short steps with shuffle, may have difficulty arising from chair, head down, impaired 0 No balance) Mental Status Oriented to own ability 0 Yes Electronic Signature(s) Signed: 01/16/2022 3:53:43 PM By: Levora Dredge Entered By: Levora Dredge on 01/16/2022 13:10:44 Mallon, Ripon. ([id number]) -------------------------------------------------------------------------------- Foot Assessment Details Patient Name: Keil, Melvena E. Date of Service: 01/16/2022 12:45 PM Medical Record Number: [id number] Patient Account Number: [id number] Date of Birth/Sex: 29-Jul-1946 (76 y.o. Female) Treating RN: Levora Dredge Primary Care Kolbie Clarkston: [name] Other Clinician: Referring Jalani Cullifer: Kurtis Bushman Treating Tallen Schnorr/Extender: Skipper Cliche in Treatment: 0 Foot Assessment Items Site Locations + = Sensation present, - = Sensation absent, C = Callus, U = Ulcer R = Redness, W = Warmth, M = Maceration, PU = Pre-ulcerative lesion F = Fissure, S = Swelling, D = Dryness Assessment Right: Left: Other Deformity: No No Prior Foot Ulcer: No No Prior Amputation: No No Charcot Joint: No No Ambulatory Status: Gait: Notes not  completed due to wound location, wound on sacral region Electronic Signature(s) Signed: 01/16/2022 3:53:43 PM By: Levora Dredge Entered By: Levora Dredge on 01/16/2022 13:03:46 [place], [place]. ([id number]) -------------------------------------------------------------------------------- Nutrition Risk Screening Details Patient Name: Juliana, Lonia E. Date of Service: 01/16/2022 12:45 PM Medical Record Number: [id number] Patient Account Number: [id number] Date of Birth/Sex: March 26, 1946 (76 y.o. Female) Treating RN: Levora Dredge Primary Care Benisha Hadaway: [name] Other Clinician: Referring Josalyn Dettmann: Kurtis Bushman Treating Donnice Nielsen/Extender: Skipper Cliche in Treatment: 0 Height (in): 63 Weight (lbs): 185 Body Mass Index (BMI): 32.8 Nutrition Risk Screening Items Score Screening NUTRITION RISK SCREEN: I have an illness or condition that made me change the kind and/or amount of food I eat 0 No I eat fewer than two meals per day 0 No I eat few fruits and vegetables, or milk products 0 No I have three or more drinks of beer, liquor or wine almost every day 0 No I have tooth or mouth problems that make it hard for me to eat 0 No I don't always have enough money to buy the food I need 0 No I eat alone most of the time 0 No I take three or more different prescribed or over-the-counter drugs a day 0 No Without wanting to, I have lost or gained 10 pounds in the last six months 0 No I am not always physically able to shop, cook and/or feed myself 0 No Nutrition Protocols Good Risk Protocol 0 No interventions needed Moderate Risk Protocol High Risk Proctocol Risk Level: Good Risk Score: 0 Electronic Signature(s) Signed: 01/16/2022 3:53:43 PM By: Levora Dredge Entered By: Levora Dredge on 01/16/2022 13:11:01

## 2022-01-16 NOTE — Progress Notes (Addendum)
Kilts, Felesia E. ([id number]) Visit Report for 01/16/2022 Allergy List Details Patient Name: Lemler, Miyako E. Date of Service: 01/16/2022 12:45 PM Medical Record Number: [id number] Patient Account Number: [id number] Date of Birth/Sex: 10-Nov-1946 (76 y.o. Female) Treating RN: Cornell Barman Primary Care Lean Fayson: [name] Other Clinician: Referring Javione Gunawan: Kurtis Bushman Treating Vincent Streater/Extender: Jeri Cos Weeks in Treatment: 0 Allergies Active Allergies iodine Reaction: swelling Severity: Severe Iodinated Contrast Media Reaction: anaphylaxis Severity: Severe Allergy Notes Electronic Signature(s) Signed: 01/16/2022 11:20:56 AM By: Gretta Cool, BSN, RN, CWS, Kim RN, BSN Entered By: Gretta Cool, BSN, RN, CWS, Kim on 01/16/2022 11:20:55 Paynter, Precious E. ([id number]) -------------------------------------------------------------------------------- Arrival Information Details Patient Name: Morefield, Kaileen E. Date of Service: 01/16/2022 12:45 PM Medical Record Number: [id number] Patient Account Number: [id number] Date of Birth/Sex: [birth year] (76 y.o. Female) Treating RN: Levora Dredge Primary Care Amado Andal: [name] Other Clinician: Referring Tatsuya Okray: Kurtis Bushman Treating Azaela Caracci/Extender: Skipper Cliche in Treatment: 0 Visit Information Patient Arrived: Wheel Chair Arrival Time: 12:58 Accompanied By: husband Transfer Assistance: EasyPivot Patient Lift Patient Identification Verified: Yes Secondary Verification Process Completed: Yes Patient Has Alerts: Yes Patient Alerts: Patient on Blood Thinner Type 2 diabetic Electronic Signature(s) Signed: 01/16/2022 3:53:43 PM By: Levora Dredge Entered By: Levora Dredge on 01/16/2022 12:59:06 Strauch, Halcyon E. ([id number]) -------------------------------------------------------------------------------- Clinic Level of Care Assessment Details Patient Name: Phaneuf, Jeanni E. Date of Service: 01/16/2022 12:45 PM Medical Record Number: [id number] Patient  Account Number: [id number] Date of Birth/Sex: 06/14/1946 (76 y.o. Female) Treating RN: Levora Dredge Primary Care Tylen Leverich: [name] Other Clinician: Referring Sheria Rosello: Kurtis Bushman Treating Abie Killian/Extender: Skipper Cliche in Treatment: 0 Clinic Level of Care Assessment Items TOOL 1 Quantity Score []  - Use when EandM and Procedure is performed on INITIAL visit 0 ASSESSMENTS - Nursing Assessment / Reassessment []  - General Physical Exam (combine w/ comprehensive assessment (listed just below) when performed on new 0 pt. evals) X- 1 25 Comprehensive Assessment (HX, ROS, Risk Assessments, Wounds Hx, etc.) ASSESSMENTS - Wound and Skin Assessment / Reassessment []  - Dermatologic / Skin Assessment (not related to wound area) 0 ASSESSMENTS - Ostomy and/or Continence Assessment and Care []  - Incontinence Assessment and Management 0 []  - 0 Ostomy Care Assessment and Management (repouching, etc.) PROCESS - Coordination of Care X - Simple Patient / Family Education for ongoing care 1 15 []  - 0 Complex (extensive) Patient / Family Education for ongoing care X- 1 10 Staff obtains Programmer, systems, Records, Test Results / Process Orders []  - 0 Staff telephones HHA, Nursing Homes / Clarify orders / etc []  - 0 Routine Transfer to another Facility (non-emergent condition) []  - 0 Routine Hospital Admission (non-emergent condition) X- 1 15 New Admissions / Biomedical engineer / Ordering NPWT, Apligraf, etc. []  - 0 Emergency Hospital Admission (emergent condition) PROCESS - Special Needs []  - Pediatric / Minor Patient Management 0 []  - 0 Isolation Patient Management []  - 0 Hearing / Language / Visual special needs []  - 0 Assessment of Community assistance (transportation, D/C planning, etc.) []  - 0 Additional assistance / Altered mentation []  - 0 Support Surface(s) Assessment (bed, cushion, seat, etc.) INTERVENTIONS - Miscellaneous []  - External ear exam 0 []  - 0 Patient  Transfer (multiple staff / Civil Service fast streamer / Similar devices) []  - 0 Simple Staple / Suture removal (25 or less) []  - 0 Complex Staple / Suture removal (26 or more) []  - 0 Hypo/Hyperglycemic Management (do not check if billed separately) []  - 0 Ankle / Brachial Index (ABI) - do not check if billed separately  Has the patient been seen at the hospital within the last three years: Yes Total Score: 65 Level Of Care: New/Established - Level 2 Boschert, Samia E. ([id number]) Electronic Signature(s) Signed: 01/16/2022 3:53:43 PM By: Levora Dredge Entered By: Levora Dredge on 01/16/2022 14:15:01 Clinton, Sakshi E. ([id number]) -------------------------------------------------------------------------------- Encounter Discharge Information Details Patient Name: Fede, Rynn E. Date of Service: 01/16/2022 12:45 PM Medical Record Number: [id number] Patient Account Number: [id number] Date of Birth/Sex: March 10, 1946 (76 y.o. Female) Treating RN: Levora Dredge Primary Care Ayonna Speranza: [name] Other Clinician: Referring Cesiah Westley: Kurtis Bushman Treating Azad Calame/Extender: Skipper Cliche in Treatment: 0 Encounter Discharge Information Items Post Procedure Vitals Discharge Condition: Stable Temperature (F): 98.1 Ambulatory Status: Wheelchair Pulse (bpm): 77 Discharge Destination: Home Respiratory Rate (breaths/min): 18 Transportation: Private Auto Blood Pressure (mmHg): 124/65 Accompanied By: husband Schedule Follow-up Appointment: Yes Clinical Summary of Care: Electronic Signature(s) Signed: 01/16/2022 3:14:02 PM By: Levora Dredge Entered By: Levora Dredge on 01/16/2022 15:14:01 [place], Madylyn E. ([id number]) -------------------------------------------------------------------------------- Lower Extremity Assessment Details Patient Name: Rayfield, Jamileth E. Date of Service: 01/16/2022 12:45 PM Medical Record Number: [id number] Patient Account Number: [id number] Date of Birth/Sex: [birth year] (76 y.o.  Female) Treating RN: Levora Dredge Primary Care Shirl Weir: [name] Other Clinician: Referring Churchill Grimsley: Kurtis Bushman Treating Shanele Nissan/Extender: Jeri Cos Weeks in Treatment: 0 Notes wound is to the sacral region, no wounds to legs Electronic Signature(s) Signed: 01/16/2022 3:53:43 PM By: Levora Dredge Entered By: Levora Dredge on 01/16/2022 13:03:19 Winterhalter, Nadege E. ([id number]) -------------------------------------------------------------------------------- Multi Wound Chart Details Patient Name: Cuadras, Mallory E. Date of Service: 01/16/2022 12:45 PM Medical Record Number: [id number] Patient Account Number: [id number] Date of Birth/Sex: Nov 10, 1946 (76 y.o. Female) Treating RN: Levora Dredge Primary Care Binta Statzer: [name] Other Clinician: Referring Jameria Bradway: Kurtis Bushman Treating Lannah Koike/Extender: Skipper Cliche in Treatment: 0 Vital Signs Height(in): 26 Pulse(bpm): 3 Weight(lbs): 185 Blood Pressure(mmHg): 124/65 Body Mass Index(BMI): 32.8 Temperature(F): 98.1 Respiratory Rate(breaths/min): 20 Photos: [N/A:N/A] Wound Location: Midline Sacrum N/A N/A Wounding Event: Pressure Injury N/A N/A Primary Etiology: Pressure Ulcer N/A N/A Comorbid History: Cataracts, Arrhythmia, N/A N/A Hypertension, Type II Diabetes, Osteoarthritis, Osteomyelitis Date Acquired: 08/10/2021 N/A N/A Weeks of Treatment: 0 N/A N/A Wound Status: Open N/A N/A Wound Recurrence: No N/A N/A Measurements L x W x D (cm) 1x0.5x1.9 N/A N/A Area (cm) : 0.393 N/A N/A Volume (cm) : 0.746 N/A N/A Classification: Category/Stage IV N/A N/A Exudate Amount: Medium N/A N/A Exudate Type: Serosanguineous N/A N/A Exudate Color: red, brown N/A N/A Granulation Amount: Medium (34-66%) N/A N/A Granulation Quality: Red, Pink N/A N/A Necrotic Amount: Small (1-33%) N/A N/A Exposed Structures: Fat Layer (Subcutaneous Tissue): N/A N/A Yes Epithelialization: Small (1-33%) N/A N/A Treatment  Notes Electronic Signature(s) Signed: 01/16/2022 3:53:43 PM By: Levora Dredge Entered By: Levora Dredge on 01/16/2022 13:33:32 Derusha, Ky E. ([id number]) -------------------------------------------------------------------------------- Multi-Disciplinary Care Plan Details Patient Name: Frese, Wenona E. Date of Service: 01/16/2022 12:45 PM Medical Record Number: [id number] Patient Account Number: [id number] Date of Birth/Sex: March 10, 1946 (76 y.o. Female) Treating RN: Levora Dredge Primary Care Margherita Collyer: [name] Other Clinician: Referring Luisalberto Beegle: Kurtis Bushman Treating Tekia Waterbury/Extender: Jeri Cos Weeks in Treatment: 0 Active Inactive Electronic Signature(s) Signed: 01/16/2022 3:53:43 PM By: Levora Dredge Entered By: Levora Dredge on 01/16/2022 13:20:13 [place], Sherrey E. ([id number]) -------------------------------------------------------------------------------- Pain Assessment Details Patient Name: Ketchem, Keela E. Date of Service: 01/16/2022 12:45 PM Medical Record Number: [id number] Patient Account Number: [id number] Date of Birth/Sex: 1946/04/22 (76 y.o. Female) Treating RN: Levora Dredge Primary Care Tala Eber: [name] Other Clinician: Referring Matisse Salais: Kurtis Bushman Treating Henslee Lottman/Extender:  Jeri Cos Weeks in Treatment: 0 Active Problems Location of Pain Severity and Description of Pain Patient Has Paino Yes Site Locations Rate the pain. Current Pain Level: 5 Pain Management and Medication Current Pain Management: Notes pt states pain at sacral wound site Electronic Signature(s) Signed: 01/16/2022 3:53:43 PM By: Levora Dredge Entered By: Levora Dredge on 01/16/2022 12:59:25 Farruggia, Keya E. ([id number]) -------------------------------------------------------------------------------- Patient/Caregiver Education Details Patient Name: Korb, Ferol E. Date of Service: 01/16/2022 12:45 PM Medical Record Number: [id number] Patient Account Number: [id number] Date  of Birth/Gender: 1945/12/09 (76 y.o. Female) Treating RN: Levora Dredge Primary Care Physician: [name] Other Clinician: Referring Physician: Kurtis Bushman Treating Physician/Extender: Skipper Cliche in Treatment: 0 Education Assessment Education Provided To: Patient Education Topics Provided Welcome To [facility]: Handouts: Welcome To [facility] Methods: Explain/Verbal Responses: State content correctly Wound/Skin Impairment: Handouts: Caring for Your Ulcer Methods: Explain/Verbal Responses: State content correctly Electronic Signature(s) Signed: 01/16/2022 3:53:43 PM By: Levora Dredge Entered By: Levora Dredge on 01/16/2022 14:16:38 [place], Jamie E. ([id number]) -------------------------------------------------------------------------------- Wound Assessment Details Patient Name: Cone, Nicolasa E. Date of Service: 01/16/2022 12:45 PM Medical Record Number: [id number] Patient Account Number: [id number] Date of Birth/Sex: Jul 19, 1946 (76 y.o. Female) Treating RN: Levora Dredge Primary Care Azai Gaffin: [name] Other Clinician: Referring Shaana Acocella: Kurtis Bushman Treating Rudie Rikard/Extender: Jeri Cos Weeks in Treatment: 0 Wound Status Wound Number: 1 Primary Pressure Ulcer Etiology: Wound Location: Midline Sacrum Wound Open Wounding Event: Pressure Injury Status: Date Acquired: 08/10/2021 Comorbid Cataracts, Arrhythmia, Hypertension, Type II Diabetes, Weeks Of Treatment: 0 History: Osteoarthritis, Osteomyelitis Clustered Wound: No Photos Wound Measurements Length: (cm) 1 Width: (cm) 0.5 Depth: (cm) 1.9 Area: (cm) 0.393 Volume: (cm) 0.746 % Reduction in Area: % Reduction in Volume: Epithelialization: Small (1-33%) Tunneling: No Undermining: No Wound Description Classification: Category/Stage IV Exudate Amount: Medium Exudate Type: Serosanguineous Exudate Color: red, brown Foul Odor After Cleansing: No Slough/Fibrino  Yes Wound Bed Granulation Amount: Medium (34-66%) Exposed Structure Granulation Quality: Red, Pink Fat Layer (Subcutaneous Tissue) Exposed: Yes Necrotic Amount: Small (1-33%) Necrotic Quality: Adherent Slough Treatment Notes Wound #1 (Sacrum) Wound Laterality: Midline Cleanser Wound Cleanser Discharge Instruction: Wash your hands with soap and water. Remove old dressing, discard into plastic bag and place into trash. Cleanse the wound with Wound Cleanser prior to applying a clean dressing using gauze sponges, not tissues or cotton balls. Do not scrub or use excessive force. Pat dry using gauze sponges, not tissue or cotton balls. Peri-Wound Care Topical Yanda, Shelda E. ([id number]) Primary Dressing Hydrofera Blue Classic Foam Rope Dressing, 9x6 (mm/in) Discharge Instruction: rope cut in half, cut 4cm long Secondary Dressing Gauze Discharge Instruction: 4x4 cut in half and folded Zetuvit Plus Silicone Border Dressing 4x4 (in/in) Secured With Compression Wrap Compression Stockings Add-Ons Electronic Signature(s) Signed: 01/16/2022 3:53:43 PM By: Levora Dredge Entered By: Levora Dredge on 01/16/2022 13:18:42 [place], Penn. ([id number]) -------------------------------------------------------------------------------- Vitals Details Patient Name: Eckles, Ireta E. Date of Service: 01/16/2022 12:45 PM Medical Record Number: [id number] Patient Account Number: [id number] Date of Birth/Sex: 08/21/1946 (76 y.o. Female) Treating RN: Levora Dredge Primary Care Chera Slivka: [name] Other Clinician: Referring Deionte Spivack: Kurtis Bushman Treating Tymeka Privette/Extender: Skipper Cliche in Treatment: 0 Vital Signs Time Taken: 12:45 Temperature (F): 98.1 Height (in): 63 Pulse (bpm): 77 Source: Stated Respiratory Rate (breaths/min): 20 Weight (lbs): 185 Blood Pressure (mmHg): 124/65 Source: Stated Reference Range: 80 - 120 mg / dl Body Mass Index (BMI): 32.8 Electronic Signature(s) Signed:  01/16/2022 3:53:43 PM By: Levora Dredge Entered By: Araceli Bouche,  Caitlin on 01/16/2022 13:00:07

## 2022-01-21 NOTE — Progress Notes (Signed)
RACINE, Raychel E. ([id number]) Visit Report for 01/16/2022 Chief Complaint Document Details Patient Name: Heather Lopez, Heather E. Date of Service: 01/16/2022 12:45 PM Medical Record Number: [id number] Patient Account Number: [id number] Date of Birth/Sex: December 12, 1945 (76 y.o. Female) Treating RN: Levora Dredge Primary Care Provider: [name] Other Clinician: Referring Provider: Kurtis Bushman Treating Provider/Extender: Skipper Cliche in Treatment: 0 Information Obtained from: Patient Chief Complaint Sacral pressure ulcer Electronic Signature(s) Signed: 01/16/2022 1:31:53 PM By: Worthy Keeler PA-C Entered By: Worthy Keeler on 01/16/2022 13:31:52 [place], Tamanika E. ([id number]) -------------------------------------------------------------------------------- Debridement Details Patient Name: Lopez, Heather E. Date of Service: 01/16/2022 12:45 PM Medical Record Number: [id number] Patient Account Number: [id number] Date of Birth/Sex: 1945/12/04 (76 y.o. Female) Treating RN: Levora Dredge Primary Care Provider: [name] Other Clinician: Referring Provider: Kurtis Bushman Treating Provider/Extender: Skipper Cliche in Treatment: 0 Debridement Performed for Wound #1 Midline Sacrum Assessment: Performed By: Physician Tommie Sams., PA-C Debridement Type: Debridement Level of Consciousness (Pre- Awake and Alert procedure): Pre-procedure Verification/Time Out Yes - 13:42 Taken: Total Area Debrided (L x W): 1 (cm) x 0.5 (cm) = 0.5 (cm) Tissue and other material Viable, Non-Viable, Slough, Subcutaneous, Biofilm, Slough debrided: Level: Skin/Subcutaneous Tissue Debridement Description: Excisional Instrument: Curette Bleeding: Large Hemostasis Achieved: Silver Nitrate Response to Treatment: Procedure was tolerated well Level of Consciousness (Post- Awake and Alert procedure): Post Debridement Measurements of Total Wound Length: (cm) 1 Stage: Category/Stage IV Width: (cm) 0.5 Depth:  (cm) 4 Volume: (cm) 1.571 Character of Wound/Ulcer Post Debridement: Stable Post Procedure Diagnosis Same as Pre-procedure Electronic Signature(s) Signed: 01/16/2022 3:53:43 PM By: Levora Dredge Signed: 01/16/2022 4:50:19 PM By: Worthy Keeler PA-C Entered By: Levora Dredge on 01/16/2022 13:53:19 [place], Germaine E. ([id number]) -------------------------------------------------------------------------------- HPI Details Patient Name: Freund, Heather E. Date of Service: 01/16/2022 12:45 PM Medical Record Number: [id number] Patient Account Number: [id number] Date of Birth/Sex: 1946/05/22 (76 y.o. Female) Treating RN: Levora Dredge Primary Care Provider: [name] Other Clinician: Referring Provider: Kurtis Bushman Treating Provider/Extender: Skipper Cliche in Treatment: 0 History of Present Illness HPI Description: 01/16/2022 upon evaluation patient appears to be doing somewhat poorly in regard to her wound in the sacral region which has been present for about 6 months after she was unfortunately in the ICU for period of time. Subsequently the patient actually ended up having a fall this was in July 2022 and her health has been declining slowly since that time. Overall she was also in the hospital for an extended stay in January where she was diagnosed as having osteomyelitis she was treated for this in the skilled nursing facility, [facility]. Apparently that treatment is complete for the osteomyelitis of the sacral region. With that being said she is continue to have a wound VAC though based on what I am seeing I do not believe this wound VAC has been appropriately packed into the depth of the wound this is quite deep considering how small it is. Patient does have a history of diabetes mellitus type 2, hypertension, and generalized muscle weakness. She also needs her right hip replaced but they will not do this until the wound is healed. Electronic Signature(s) Signed: 01/16/2022  4:25:25 PM By: Worthy Keeler PA-C Entered By: Worthy Keeler on 01/16/2022 16:25:25 [place], [place]. ([id number]) -------------------------------------------------------------------------------- Physical Exam Details Patient Name: Lopez, Heather E. Date of Service: 01/16/2022 12:45 PM Medical Record Number: [id number] Patient Account Number: [id number] Date of Birth/Sex: 02-19-1946 (76 y.o. Female) Treating RN: Levora Dredge Primary Care Provider: [name]  Other Clinician: Referring Provider: Kurtis Bushman Treating Provider/Extender: Skipper Cliche in Treatment: 0 Constitutional sitting or standing blood pressure is within target range for patient.. pulse regular and within target range for patient.[name] respirations regular, non- labored and within target range for patient.[name] temperature within target range for patient.. Well-nourished and well-hydrated in no acute distress. Eyes conjunctiva clear no eyelid edema noted. pupils equal round and reactive to light and accommodation. Ears, Nose, Mouth, and Throat no gross abnormality of ear auricles or external auditory canals. normal hearing noted during conversation. mucus membranes moist. Respiratory normal breathing without difficulty. Musculoskeletal normal gait and posture. no significant deformity or arthritic changes, no loss or range of motion, no clubbing. Psychiatric this patient is able to make decisions and demonstrates good insight into disease process. Alert and Oriented x 3. pleasant and cooperative. Notes Upon inspection patient's wound bed actually showed signs of good granulation and epithelization at this point. Fortunately there does not appear to be any evidence of active infection locally nor systemically at this time which is great news. She still has a fairly significantly deep wound which I believe probably extends down to bone or at least close to this. Nonetheless I think that Hydrofera Blue blue rope will probably  do better if packing into this area as opposed to anything else based on what I am seeing. I discussed that with the patient today. Electronic Signature(s) Signed: 01/16/2022 4:26:04 PM By: Worthy Keeler PA-C Entered By: Worthy Keeler on 01/16/2022 16:26:04 [place], Marylouise E. ([id number]) -------------------------------------------------------------------------------- Physician Orders Details Patient Name: Staat, Rupa E. Date of Service: 01/16/2022 12:45 PM Medical Record Number: [id number] Patient Account Number: [id number] Date of Birth/Sex: 1946/09/16 (76 y.o. Female) Treating RN: Levora Dredge Primary Care Provider: [name] Other Clinician: Referring Provider: Kurtis Bushman Treating Provider/Extender: Skipper Cliche in Treatment: 0 Verbal / Phone Orders: No Diagnosis Coding ICD-10 Coding Code Description L89.154 Pressure ulcer of sacral region, stage 4 E11.622 Type 2 diabetes mellitus with other skin ulcer I10 Essential (primary) hypertension M62.81 Muscle weakness (generalized) Follow-up Appointments o Return Appointment in 1 week. o Nurse Visit as needed [place] for wound care. May utilize formulary equivalent dressing for wound treatment orders unless otherwise specified. Home Health Nurse may visit PRN to address patientos wound care needs. o Scheduled days for dressing changes to be completed; exception, patient has scheduled wound care visit that day. o **Please direct any NON-WOUND related issues/requests for orders to patient's Primary Care Physician. **If current dressing causes regression in wound condition, may D/C ordered dressing product/s and apply Normal Saline Moist Dressing daily until next [place] or Other MD appointment. **Notify [facility] of regression in wound condition at [phone]. o Other Home Health Orders/Instructions: Art gallery manager o Wash wounds with antibacterial soap and water. o May shower; gently cleanse wound with antibacterial soap, rinse and pat dry prior to dressing wounds o No tub bath. Off-Loading o Turn and reposition every 2 hours Negative Pressure Wound Therapy o Place NPWT on HOLD. Wound Treatment Wound #1 - Sacrum Wound Laterality: Midline Cleanser: Wound Cleanser 1 x Per Day/30 Days Discharge Instructions: Wash your hands with soap and water. Remove old dressing, discard into plastic bag and place into trash. Cleanse the wound with Wound Cleanser prior to applying a clean dressing using gauze sponges, not tissues or cotton balls. Do not scrub or use excessive force. Pat dry using  gauze sponges, not tissue or cotton balls. Primary Dressing: Hydrofera Blue Classic Foam Rope Dressing, 9x6 (mm/in) 1 x Per Day/30 Days Discharge Instructions: rope cut in half, cut 4cm long Secondary Dressing: Gauze 1 x Per Day/30 Days Discharge Instructions: 4x4 cut in half and folded Secondary Dressing: Zetuvit Plus Silicone Border Dressing 4x4 (in/in) 1 x Per Day/30 Days Electronic Signature(s) Signed: 01/16/2022 3:53:43 PM By: Levora Dredge Signed: 01/16/2022 4:50:19 PM By: Worthy Keeler PA-C Entered By: Levora Dredge on 01/16/2022 14:18:47 Kimberly, Gwendy E. ([id number]) [place], Tenise E. ([id number]) -------------------------------------------------------------------------------- Problem List Details Patient Name: Macmurray, Kylene E. Date of Service: 01/16/2022 12:45 PM Medical Record Number: [id number] Patient Account Number: [id number] Date of Birth/Sex: 08/11/1946 (76 y.o. Female) Treating RN: Levora Dredge Primary Care Provider: [name] Other Clinician: Referring Provider: Kurtis Bushman Treating Provider/Extender: Skipper Cliche in Treatment: 0 Active Problems ICD-10 Encounter Code Description Active Date MDM Diagnosis L89.154 Pressure ulcer of sacral region, stage 4 01/16/2022 No  Yes E11.622 Type 2 diabetes mellitus with other skin ulcer 01/16/2022 No Yes I10 Essential (primary) hypertension 01/16/2022 No Yes M62.81 Muscle weakness (generalized) 01/16/2022 No Yes Inactive Problems Resolved Problems Electronic Signature(s) Signed: 01/16/2022 1:31:29 PM By: Worthy Keeler PA-C Entered By: Worthy Keeler on 01/16/2022 13:31:29 Piekarski, Margarine E. ([id number]) -------------------------------------------------------------------------------- Progress Note Details Patient Name: Ost, Meiya E. Date of Service: 01/16/2022 12:45 PM Medical Record Number: [id number] Patient Account Number: [id number] Date of Birth/Sex: [birth year] (76 y.o. Female) Treating RN: Levora Dredge Primary Care Provider: [name] Other Clinician: Referring Provider: Kurtis Bushman Treating Provider/Extender: Skipper Cliche in Treatment: 0 Subjective Chief Complaint Information obtained from Patient Sacral pressure ulcer History of Present Illness (HPI) 01/16/2022 upon evaluation patient appears to be doing somewhat poorly in regard to her wound in the sacral region which has been present for about 6 months after she was unfortunately in the ICU for period of time. Subsequently the patient actually ended up having a fall this was in July 2022 and her health has been declining slowly since that time. Overall she was also in the hospital for an extended stay in January where she was diagnosed as having osteomyelitis she was treated for this in the skilled nursing facility, [facility]. Apparently that treatment is complete for the osteomyelitis of the sacral region. With that being said she is continue to have a wound VAC though based on what I am seeing I do not believe this wound VAC has been appropriately packed into the depth of the wound this is quite deep considering how small it is. Patient does have a history of diabetes mellitus type 2, hypertension, and generalized muscle weakness. She also  needs her right hip replaced but they will not do this until the wound is healed. Patient History Information obtained from Chart. Allergies iodine (Severity: Severe, Reaction: swelling), Iodinated Contrast Media (Severity: Severe, Reaction: anaphylaxis) Social History Former smoker - quit in 1973, Marital Status - Married, Alcohol Use - Never - in past, Drug Use - No History, Caffeine Use - Moderate - soda. Medical History Eyes Patient has history of Cataracts - bilat surgery completed Cardiovascular Patient has history of Arrhythmia, Hypertension Endocrine Patient has history of Type II Diabetes - Uncontrolled Musculoskeletal Patient has history of Osteoarthritis - pelvic region and thigh, Osteomyelitis - Chronic Osteomyelitis of coccyx Patient is treated with Insulin. Blood sugar is tested. Blood sugar results noted at the following times: Breakfast - 132. Medical And Surgical History Notes Respiratory Acute Respiratory Failure with  hypoxia Cardiovascular Atrial Flutter with rapid ventricular response, Demand ischemia of myocardium, Essential HTN Gastrointestinal IBS Genitourinary CKD Stage 3, UTIs, Kidney stones, renal abscess Review of Systems (ROS) Constitutional Symptoms (General Health) Denies complaints or symptoms of Fatigue, Fever, Chills, Marked Weight Change. Eyes Complains or has symptoms of Glasses / Contacts - reading glasses. Ear/Nose/Mouth/Throat Denies complaints or symptoms of Difficult clearing ears, Sinusitis. Hematologic/Lymphatic Denies complaints or symptoms of Bleeding / Clotting Disorders, Human Immunodeficiency Virus. Respiratory Denies complaints or symptoms of Chronic or frequent coughs, Shortness of Breath. Gastrointestinal Complains or has symptoms of Nausea, Vomiting. Endocrine Complains or has symptoms of Thyroid disease - Hyperparathyroidism of renal origin. Genitourinary Complains or has symptoms of Kidney failure/ Dialysis. [place], Makyah E.  ([id number]) Immunological Denies complaints or symptoms of Hives, Itching. Integumentary (Skin) Denies complaints or symptoms of Wounds, Bleeding or bruising tendency, Breakdown, Swelling. Musculoskeletal Complains or has symptoms of Muscle Weakness - RLE. Neurologic Denies complaints or symptoms of Numbness/parasthesias, Focal/Weakness. Psychiatric Complains or has symptoms of Anxiety - depression. Objective Constitutional sitting or standing blood pressure is within target range for patient.. pulse regular and within target range for patient.[name] respirations regular, non- labored and within target range for patient.[name] temperature within target range for patient.. Well-nourished and well-hydrated in no acute distress. Vitals Time Taken: 12:45 PM, Height: 63 in, Source: Stated, Weight: 185 lbs, Source: Stated, BMI: 32.8, Temperature: 98.1 F, Pulse: 77 bpm, Respiratory Rate: 20 breaths/min, Blood Pressure: 124/65 mmHg. Eyes conjunctiva clear no eyelid edema noted. pupils equal round and reactive to light and accommodation. Ears, Nose, Mouth, and Throat no gross abnormality of ear auricles or external auditory canals. normal hearing noted during conversation. mucus membranes moist. Respiratory normal breathing without difficulty. Musculoskeletal normal gait and posture. no significant deformity or arthritic changes, no loss or range of motion, no clubbing. Psychiatric this patient is able to make decisions and demonstrates good insight into disease process. Alert and Oriented x 3. pleasant and cooperative. General Notes: Upon inspection patient's wound bed actually showed signs of good granulation and epithelization at this point. Fortunately there does not appear to be any evidence of active infection locally nor systemically at this time which is great news. She still has a fairly significantly deep wound which I believe probably extends down to bone or at least close to this. Nonetheless  I think that Hydrofera Blue blue rope will probably do better if packing into this area as opposed to anything else based on what I am seeing. I discussed that with the patient today. Integumentary (Hair, Skin) Wound #1 status is Open. Original cause of wound was Pressure Injury. The date acquired was: 08/10/2021. The wound is located on the Midline Sacrum. The wound measures 1cm length x 0.5cm width x 1.9cm depth; 0.393cm^2 area and 0.746cm^3 volume. There is Fat Layer (Subcutaneous Tissue) exposed. There is no tunneling or undermining noted. There is a medium amount of serosanguineous drainage noted. There is medium (34-66%) red, pink granulation within the wound bed. There is a small (1-33%) amount of necrotic tissue within the wound bed including Adherent Slough. Assessment Active Problems ICD-10 Pressure ulcer of sacral region, stage 4 Type 2 diabetes mellitus with other skin ulcer Essential (primary) hypertension Muscle weakness (generalized) [place], Mekiah E. ([id number]) Procedures Wound #1 Pre-procedure diagnosis of Wound #1 is a Pressure Ulcer located on the Midline Sacrum . There was a Excisional Skin/Subcutaneous Tissue Debridement with a total area of 0.5 sq cm performed by Tommie Sams., PA-C. With the following  instrument(s): Curette to remove Viable and Non-Viable tissue/material. Material removed includes Subcutaneous Tissue, Slough, and Biofilm. No specimens were taken. A time out was conducted at 13:42, prior to the start of the procedure. A Large amount of bleeding was controlled with Silver Nitrate. The procedure was tolerated well. Post Debridement Measurements: 1cm length x 0.5cm width x 4cm depth; 1.571cm^3 volume. Post debridement Stage noted as Category/Stage IV. Character of Wound/Ulcer Post Debridement is stable. Post procedure Diagnosis Wound #1: Same as Pre-Procedure Plan Follow-up Appointments: Return Appointment in 1 week. Nurse Visit as needed Home  Health: [place]: - [place] for wound care. May utilize formulary equivalent dressing for wound treatment orders unless otherwise specified. Home Health Nurse may visit PRN to address patient s wound care needs. Scheduled days for dressing changes to be completed; exception, patient has scheduled wound care visit that day. **Please direct any NON-WOUND related issues/requests for orders to patient's Primary Care Physician. **If current dressing causes regression in wound condition, may D/C ordered dressing product/s and apply Normal Saline Moist Dressing daily until next [place] or Other MD appointment. **Notify [facility] of regression in wound condition at [phone]. Other Home Health Orders/Instructions: Bathing/ Shower/ Hygiene: Wash wounds with antibacterial soap and water. May shower; gently cleanse wound with antibacterial soap, rinse and pat dry prior to dressing wounds No tub bath. Off-Loading: Turn and reposition every 2 hours Negative Pressure Wound Therapy: Place NPWT on HOLD. WOUND #1: - Sacrum Wound Laterality: Midline Cleanser: Wound Cleanser 1 x Per Day/30 Days Discharge Instructions: Wash your hands with soap and water. Remove old dressing, discard into plastic bag and place into trash. Cleanse the wound with Wound Cleanser prior to applying a clean dressing using gauze sponges, not tissues or cotton balls. Do not scrub or use excessive force. Pat dry using gauze sponges, not tissue or cotton balls. Primary Dressing: Hydrofera Blue Classic Foam Rope Dressing, 9x6 (mm/in) 1 x Per Day/30 Days Discharge Instructions: rope cut in half, cut 4cm long Secondary Dressing: Gauze 1 x Per Day/30 Days Discharge Instructions: 4x4 cut in half and folded Secondary Dressing: Zetuvit Plus Silicone Border Dressing 4x4 (in/in) 1 x Per Day/30 Days 1. Would recommend we put the wound VAC on hold for the time being.  Nonetheless we will probably discontinue that I plan to see her in 1 week's time at that time will see where the wound stands I think is can be a lot better than what we currently see. 2. I am also can recommend that we use the Zetuvit bordered foam dressing to cover with a gauze bolster underneath in order to allow for something to drain into as this is probably get a continue to drain quite a bit. We will see patient back for reevaluation in 1 week here in the clinic. If anything worsens or changes patient will contact our office for additional recommendations. Electronic Signature(s) Signed: 01/16/2022 4:26:40 PM By: Worthy Keeler PA-C Entered By: Worthy Keeler on 01/16/2022 16:26:40 [place], Venus E. ([id number]) -------------------------------------------------------------------------------- ROS/PFSH Details Patient Name: Cimino, Kaari E. Date of Service: 01/16/2022 12:45 PM Medical Record Number: [id number] Patient Account Number: [id number] Date of Birth/Sex: August 15, 1946 (76 y.o. Female) Treating RN: Cornell Barman Primary Care Provider: [name] Other Clinician: Referring Provider: Kurtis Bushman Treating Provider/Extender: Skipper Cliche in Treatment: 0 Information Obtained From Chart Constitutional Symptoms (General Health) Complaints and Symptoms: Negative for: Fatigue; Fever; Chills; Marked Weight Change Eyes Complaints and Symptoms:  Positive for: Glasses / Contacts - reading glasses Medical History: Positive for: Cataracts - bilat surgery completed Ear/Nose/Mouth/Throat Complaints and Symptoms: Negative for: Difficult clearing ears; Sinusitis Hematologic/Lymphatic Complaints and Symptoms: Negative for: Bleeding / Clotting Disorders; Human Immunodeficiency Virus Respiratory Complaints and Symptoms: Negative for: Chronic or frequent coughs; Shortness of Breath Medical History: Past Medical History Notes: Acute Respiratory Failure with  hypoxia Gastrointestinal Complaints and Symptoms: Positive for: Nausea; Vomiting Medical History: Past Medical History Notes: IBS Endocrine Complaints and Symptoms: Positive for: Thyroid disease - Hyperparathyroidism of renal origin Medical History: Positive for: Type II Diabetes - Uncontrolled Time with diabetes: 1998 diagnosed Treated with: Insulin Blood sugar tested every day: Yes Tested : once daily Blood sugar testing results: Breakfast: 132 Genitourinary [place]ones, Carrina E. ([id number]) Complaints and Symptoms: Positive for: Kidney failure/ Dialysis Medical History: Past Medical History Notes: CKD Stage 3, UTIs, Kidney stones, renal abscess Immunological Complaints and Symptoms: Negative for: Hives; Itching Integumentary (Skin) Complaints and Symptoms: Negative for: Wounds; Bleeding or bruising tendency; Breakdown; Swelling Musculoskeletal Complaints and Symptoms: Positive for: Muscle Weakness - RLE Medical History: Positive for: Osteoarthritis - pelvic region and thigh; Osteomyelitis - Chronic Osteomyelitis of coccyx Neurologic Complaints and Symptoms: Negative for: Numbness/parasthesias; Focal/Weakness Psychiatric Complaints and Symptoms: Positive for: Anxiety - depression Cardiovascular Medical History: Positive for: Arrhythmia; Hypertension Past Medical History Notes: Atrial Flutter with rapid ventricular response, Demand ischemia of myocardium, Essential HTN Oncologic HBO Extended History Items Eyes: Cataracts Immunizations Pneumococcal Vaccine: Received Pneumococcal Vaccination: Yes Received Pneumococcal Vaccination On or After 60th Birthday: Yes Tetanus Vaccine: Last tetanus shot: 03/26/2009 Implantable Devices No devices added Family and Social History Former smoker - quit in 1973; Marital Status - Married; Alcohol Use: Never - in past; Drug Use: No History; Caffeine Use: Moderate - soda Electronic Signature(s) Signed: 01/16/2022 3:53:43 PM By: Levora Dredge Signed: 01/16/2022 4:50:19 PM By: Worthy Keeler PA-C Signed: 01/21/2022 8:14:17 PM By: Gretta Cool, BSN, RN, CWS, Kim RN, BSN Schiano, Naviah E. ([id number]) Entered By: Levora Dredge on 01/16/2022 13:08:14 Zeck, Rand E. ([id number]) -------------------------------------------------------------------------------- SuperBill Details Patient Name: Plasencia, Onesti E. Date of Service: 01/16/2022 Medical Record Number: [id number] Patient Account Number: [id number] Date of Birth/Sex: 1946/07/28 (76 y.o. Female) Treating RN: Levora Dredge Primary Care Provider: [name] Other Clinician: Referring Provider: Kurtis Bushman Treating Provider/Extender: Skipper Cliche in Treatment: 0 Diagnosis Coding ICD-10 Codes Code Description L89.154 Pressure ulcer of sacral region, stage 4 E11.622 Type 2 diabetes mellitus with other skin ulcer I10 Essential (primary) hypertension M62.81 Muscle weakness (generalized) Facility Procedures CPT4 Code: [id number] Description: [phone] - WOUND CARE VISIT-LEV 2 EST PT Modifier: Quantity: 1 CPT4 Code: [id number] Description: 69450 - DEB SUBQ TISSUE 20 SQ CM/< Modifier: Quantity: 1 CPT4 Code: Description: ICD-10 Diagnosis Description L89.154 Pressure ulcer of sacral region, stage 4 Modifier: Quantity: Physician Procedures CPT4 Code: 3888280 Description: 03491 - WC PHYS LEVEL 4 - NEW PT Modifier: 25 Quantity: 1 CPT4 Code: Description: ICD-10 Diagnosis Description L89.154 Pressure ulcer of sacral region, stage 4 E11.622 Type 2 diabetes mellitus with other skin ulcer I10 Essential (primary) hypertension M62.81 Muscle weakness (generalized) Modifier: Quantity: CPT4 Code: 7915056 Description: 11042 - WC PHYS SUBQ TISS 20 SQ CM Modifier: Quantity: 1 CPT4 Code: Description: ICD-10 Diagnosis Description L89.154 Pressure ulcer of sacral region, stage 4 Modifier: Quantity: Electronic Signature(s) Signed: 01/16/2022 4:28:15 PM By: Worthy Keeler PA-C Previous  Signature: 01/16/2022 3:59:54 PM Version By: Levora Dredge Entered By: Worthy Keeler on 01/16/2022 16:28:15

## 2022-01-25 ENCOUNTER — Encounter: Payer: Medicare Other | Attending: Physician Assistant | Admitting: Physician Assistant

## 2022-01-25 ENCOUNTER — Other Ambulatory Visit: Payer: Self-pay

## 2022-01-25 DIAGNOSIS — L89154 Pressure ulcer of sacral region, stage 4: Secondary | ICD-10-CM | POA: Insufficient documentation

## 2022-01-25 DIAGNOSIS — M6281 Muscle weakness (generalized): Secondary | ICD-10-CM | POA: Insufficient documentation

## 2022-01-25 DIAGNOSIS — I10 Essential (primary) hypertension: Secondary | ICD-10-CM | POA: Insufficient documentation

## 2022-01-25 DIAGNOSIS — E11622 Type 2 diabetes mellitus with other skin ulcer: Secondary | ICD-10-CM | POA: Insufficient documentation

## 2022-01-25 NOTE — Progress Notes (Addendum)
Lopez, Heather E. ([id number]) Visit Report for 01/25/2022 Arrival Information Details Patient Name: Heather Lopez, Heather E. Date of Service: 01/25/2022 3:00 PM Medical Record Number: [id number] Patient Account Number: [id number] Date of Birth/Sex: 1946/01/14 (76 y.o. F) Treating RN: Levora Dredge Primary Care Michaelpaul Apo: [name] Other Clinician: Referring Sindhu Nguyen: [name] Treating Shahil Speegle/Extender: Skipper Cliche in Treatment: 1 Visit Information History Since Last Visit Added or deleted any medications: No Patient Arrived: Wheel Chair Any new allergies or adverse reactions: No Arrival Time: 15:13 Had a fall or experienced change in No Accompanied By: husband activities of daily living that may affect Transfer Assistance: EasyPivot Patient Lift risk of falls: Patient Identification Verified: Yes Hospitalized since last visit: No Secondary Verification Process Completed: Yes Has Dressing in Place as Prescribed: Yes Patient Has Alerts: Yes Pain Present Now: Yes Patient Alerts: Patient on Blood Thinner Type 2 diabetic Electronic Signature(s) Signed: 01/25/2022 4:37:42 PM By: Levora Dredge Entered By: Levora Dredge on 01/25/2022 15:13:24 [place], [place]. ([id number]) -------------------------------------------------------------------------------- Clinic Level of Care Assessment Details Patient Name: Lopez, Heather E. Date of Service: 01/25/2022 3:00 PM Medical Record Number: [id number] Patient Account Number: [id number] Date of Birth/Sex: 1946/05/14 (76 y.o. F) Treating RN: Levora Dredge Primary Care Latonia Conrow: [name] Other Clinician: Referring Kierre Deines: [name] Treating Donzell Coller/Extender: Skipper Cliche in Treatment: 1 Clinic Level of Care Assessment Items TOOL 4 Quantity Score []  - Use when only an EandM is performed on FOLLOW-UP visit 0 ASSESSMENTS - Nursing Assessment / Reassessment []  - Reassessment of Co-morbidities (includes updates in patient status)  0 X- 1 5 Reassessment of Adherence to Treatment Plan ASSESSMENTS - Wound and Skin Assessment / Reassessment X - Simple Wound Assessment / Reassessment - one wound 1 5 []  - 0 Complex Wound Assessment / Reassessment - multiple wounds []  - 0 Dermatologic / Skin Assessment (not related to wound area) ASSESSMENTS - Focused Assessment []  - Circumferential Edema Measurements - multi extremities 0 []  - 0 Nutritional Assessment / Counseling / Intervention []  - 0 Lower Extremity Assessment (monofilament, tuning fork, pulses) []  - 0 Peripheral Arterial Disease Assessment (using hand held doppler) ASSESSMENTS - Ostomy and/or Continence Assessment and Care []  - Incontinence Assessment and Management 0 []  - 0 Ostomy Care Assessment and Management (repouching, etc.) PROCESS - Coordination of Care X - Simple Patient / Family Education for ongoing care 1 15 []  - 0 Complex (extensive) Patient / Family Education for ongoing care []  - 0 Staff obtains Programmer, systems, Records, Test Results / Process Orders []  - 0 Staff telephones HHA, Nursing Homes / Clarify orders / etc []  - 0 Routine Transfer to another Facility (non-emergent condition) []  - 0 Routine Hospital Admission (non-emergent condition) []  - 0 New Admissions / Biomedical engineer / Ordering NPWT, Apligraf, etc. []  - 0 Emergency Hospital Admission (emergent condition) X- 1 10 Simple Discharge Coordination []  - 0 Complex (extensive) Discharge Coordination PROCESS - Special Needs []  - Pediatric / Minor Patient Management 0 []  - 0 Isolation Patient Management []  - 0 Hearing / Language / Visual special needs []  - 0 Assessment of Community assistance (transportation, D/C planning, etc.) []  - 0 Additional assistance / Altered mentation []  - 0 Support Surface(s) Assessment (bed, cushion, seat, etc.) INTERVENTIONS - Wound Cleansing / Measurement Lopez, Heather E. ([id number]) X- 1 5 Simple Wound Cleansing - one wound []  - 0 Complex  Wound Cleansing - multiple wounds X- 1 5 Wound Imaging (photographs - any number of wounds) []  - 0 Wound Tracing (instead of photographs) X- 1  5 Simple Wound Measurement - one wound []  - 0 Complex Wound Measurement - multiple wounds INTERVENTIONS - Wound Dressings X - Small Wound Dressing one or multiple wounds 1 10 []  - 0 Medium Wound Dressing one or multiple wounds []  - 0 Large Wound Dressing one or multiple wounds []  - 0 Application of Medications - topical []  - 0 Application of Medications - injection INTERVENTIONS - Miscellaneous []  - External ear exam 0 []  - 0 Specimen Collection (cultures, biopsies, blood, body fluids, etc.) []  - 0 Specimen(s) / Culture(s) sent or taken to Lab for analysis []  - 0 Patient Transfer (multiple staff / Civil Service fast streamer / Similar devices) []  - 0 Simple Staple / Suture removal (25 or less) []  - 0 Complex Staple / Suture removal (26 or more) []  - 0 Hypo / Hyperglycemic Management (close monitor of Blood Glucose) []  - 0 Ankle / Brachial Index (ABI) - do not check if billed separately X- 1 5 Vital Signs Has the patient been seen at the hospital within the last three years: Yes Total Score: 65 Level Of Care: New/Established - Level 2 Electronic Signature(s) Signed: 01/25/2022 4:37:42 PM By: Levora Dredge Entered By: Levora Dredge on 01/25/2022 16:11:59 [place], Emonni E. ([id number]) -------------------------------------------------------------------------------- Encounter Discharge Information Details Patient Name: Lopez, Heather E. Date of Service: 01/25/2022 3:00 PM Medical Record Number: [id number] Patient Account Number: [id number] Date of Birth/Sex: 10-Nov-1946 (76 y.o. F) Treating RN: Levora Dredge Primary Care Genell Thede: [name] Other Clinician: Referring Celene Pippins: [name] Treating Miquel Lamson/Extender: Skipper Cliche in Treatment: 1 Encounter Discharge Information Items Discharge Condition: Stable Ambulatory Status:  Wheelchair Discharge Destination: Home Transportation: Private Auto Accompanied By: husband Schedule Follow-up Appointment: Yes Clinical Summary of Care: Electronic Signature(s) Signed: 01/25/2022 4:14:25 PM By: Levora Dredge Entered By: Levora Dredge on 01/25/2022 16:14:25 [place], [place]. ([id number]) -------------------------------------------------------------------------------- Lower Extremity Assessment Details Patient Name: Alfredo, Zierra E. Date of Service: 01/25/2022 3:00 PM Medical Record Number: [id number] Patient Account Number: [id number] Date of Birth/Sex: January 16, 1946 (76 y.o. F) Treating RN: Levora Dredge Primary Care Macala Baldonado: [name] Other Clinician: Referring Euretha Najarro: [name] Treating Aubrynn Katona/Extender: Jeri Cos Weeks in Treatment: 1 Electronic Signature(s) Signed: 01/25/2022 4:37:42 PM By: Levora Dredge Entered By: Levora Dredge on 01/25/2022 15:21:03 [place], [place]. ([id number]) -------------------------------------------------------------------------------- Multi Wound Chart Details Patient Name: Gil, Donetta E. Date of Service: 01/25/2022 3:00 PM Medical Record Number: [id number] Patient Account Number: [id number] Date of Birth/Sex: 1946-06-02 (76 y.o. F) Treating RN: Levora Dredge Primary Care Zelma Snead: [name] Other Clinician: Referring Merri Dimaano: [name] Treating Junnie Loschiavo/Extender: Jeri Cos Weeks in Treatment: 1 Vital Signs Height(in): 88 Pulse(bpm): 12 Weight(lbs): 185 Blood Pressure(mmHg): 100/60 Body Mass Index(BMI): 32.8 Temperature(F): 97.9 Respiratory Rate(breaths/min): 18 Photos: [N/A:N/A] Wound Location: Midline Sacrum N/A N/A Wounding Event: Pressure Injury N/A N/A Primary Etiology: Pressure Ulcer N/A N/A Comorbid History: Cataracts, Arrhythmia, N/A N/A Hypertension, Type II Diabetes, Osteoarthritis, Osteomyelitis Date Acquired: 08/10/2021 N/A N/A Weeks of Treatment: 1 N/A N/A Wound Status: Open N/A  N/A Wound Recurrence: No N/A N/A Measurements L x W x D (cm) 0.5x0.3x2.4 N/A N/A Area (cm) : 0.118 N/A N/A Volume (cm) : 0.283 N/A N/A % Reduction in Area: 70.00% N/A N/A % Reduction in Volume: 62.10% N/A N/A Classification: Category/Stage IV N/A N/A Exudate Amount: Medium N/A N/A Exudate Type: Serosanguineous N/A N/A Exudate Color: red, brown N/A N/A Granulation Amount: Medium (34-66%) N/A N/A Granulation Quality: Red, Pink N/A N/A Necrotic Amount: Small (1-33%) N/A N/A Exposed Structures: Fat Layer (Subcutaneous Tissue): N/A N/A Yes Epithelialization:  Small (1-33%) N/A N/A Treatment Notes Electronic Signature(s) Signed: 01/25/2022 4:37:42 PM By: Levora Dredge Entered By: Levora Dredge on 01/25/2022 15:50:21 [place] [facility], Lakendria E. ([id number]) -------------------------------------------------------------------------------- Multi-Disciplinary Care Plan Details Patient Name: Hardebeck, Lumi E. Date of Service: 01/25/2022 3:00 PM Medical Record Number: [id number] Patient Account Number: [id number] Date of Birth/Sex: December 05, 1945 (76 y.o. F) Treating RN: Levora Dredge Primary Care Sarely Stracener: [name] Other Clinician: Referring Navea Woodrow: [name] Treating Yoseph Haile/Extender: Jeri Cos Weeks in Treatment: 1 Active Inactive Electronic Signature(s) Signed: 01/25/2022 4:37:42 PM By: Levora Dredge Entered By: Levora Dredge on 01/25/2022 15:49:40 [place], [place] ([id number]) -------------------------------------------------------------------------------- Pain Assessment Details Patient Name: Riccobono, Deretha E. Date of Service: 01/25/2022 3:00 PM Medical Record Number: [id number] Patient Account Number: [id number] Date of Birth/Sex: [birth year] (76 y.o. F) Treating RN: Levora Dredge Primary Care Garon Melander: [name] Other Clinician: Referring Tanisha Lutes: [name] Treating Rajat Staver/Extender: Jeri Cos Weeks in Treatment: 1 Active Problems Location of Pain Severity and  Description of Pain Patient Has Paino Yes Site Locations Rate the pain. Current Pain Level: 4 Pain Management and Medication Current Pain Management: Notes pt Lopez pain at wound site Electronic Signature(s) Signed: 01/25/2022 4:37:42 PM By: Levora Dredge Entered By: Levora Dredge on 01/25/2022 15:14:08 [place], Mikea E. ([id number]) -------------------------------------------------------------------------------- Patient/Caregiver Education Details Patient Name: Brucato, Yobana E. Date of Service: 01/25/2022 3:00 PM Medical Record Number: [id number] Patient Account Number: [id number] Date of Birth/Gender: 13-Jul-1946 (76 y.o. F) Treating RN: Levora Dredge Primary Care Physician: [name] Other Clinician: Referring Physician: [name] Treating Physician/Extender: Skipper Cliche in Treatment: 1 Education Assessment Education Provided To: Patient and Caregiver Education Topics Provided Wound/Skin Impairment: Handouts: Caring for Your Ulcer Methods: Explain/Verbal Responses: State content correctly Electronic Signature(s) Signed: 01/25/2022 4:37:42 PM By: Levora Dredge Entered By: Levora Dredge on 01/25/2022 16:12:17 [place], [place] ([id number]) -------------------------------------------------------------------------------- Wound Assessment Details Patient Name: Marik, Christabelle E. Date of Service: 01/25/2022 3:00 PM Medical Record Number: [id number] Patient Account Number: [id number] Date of Birth/Sex: 1946/02/18 (76 y.o. F) Treating RN: Levora Dredge Primary Care Kenji Mapel: [name] Other Clinician: Referring Cana Mignano: [name] Treating Zohan Shiflet/Extender: Jeri Cos Weeks in Treatment: 1 Wound Status Wound Number: 1 Primary Pressure Ulcer Etiology: Wound Location: Midline Sacrum Wound Open Wounding Event: Pressure Injury Status: Date Acquired: 08/10/2021 Comorbid Cataracts, Arrhythmia, Hypertension, Type II Diabetes, Weeks Of Treatment: 1 History:  Osteoarthritis, Osteomyelitis Clustered Wound: No Photos Wound Measurements Length: (cm) 0.5 Width: (cm) 0.3 Depth: (cm) 3 Area: (cm) 0.118 Volume: (cm) 0.353 % Reduction in Area: 70% % Reduction in Volume: 52.7% Epithelialization: Small (1-33%) Tunneling: No Undermining: No Wound Description Classification: Category/Stage IV Exudate Amount: Medium Exudate Type: Serosanguineous Exudate Color: red, brown Foul Odor After Cleansing: No Slough/Fibrino Yes Wound Bed Granulation Amount: Medium (34-66%) Exposed Structure Granulation Quality: Red, Pink Fat Layer (Subcutaneous Tissue) Exposed: Yes Necrotic Amount: Small (1-33%) Treatment Notes Wound #1 (Sacrum) Wound Laterality: Midline Cleanser Wound Cleanser Discharge Instruction: Wash your hands with soap and water. Remove old dressing, discard into plastic bag and place into trash. Cleanse the wound with Wound Cleanser prior to applying a clean dressing using gauze sponges, not tissues or cotton balls. Do not scrub or use excessive force. Pat dry using gauze sponges, not tissue or cotton balls. Peri-Wound Care Topical Primary Dressing Whipple, Karlin E. ([id number]) Hydrofera Blue Classic Foam Rope Dressing, 9x6 (mm/in) Discharge Instruction: rope cut 1/4, cut 4cm long Secondary Dressing Gauze Discharge Instruction: 4x4 cut in half and folded Zetuvit Plus Silicone Border Dressing 4x4 (in/in) Secured With Compression Wrap Compression Stockings  Add-Ons Electronic Signature(s) Signed: 01/25/2022 4:37:42 PM By: Levora Dredge Entered By: Levora Dredge on 01/25/2022 15:50:54 Janusz, Jamaya E. ([id number]) -------------------------------------------------------------------------------- Vitals Details Patient Name: Uttech, Artesha E. Date of Service: 01/25/2022 3:00 PM Medical Record Number: [id number] Patient Account Number: [id number] Date of Birth/Sex: Apr 27, 1946 (76 y.o. F) Treating RN: Levora Dredge Primary Care Fumi Guadron: [name] Other Clinician: Referring Manjinder Breau: [name] Treating Darryon Bastin/Extender: Jeri Cos Weeks in Treatment: 1 Vital Signs Time Taken: 15:10 Temperature (F): 97.9 Height (in): 63 Pulse (bpm): 88 Weight (lbs): 185 Respiratory Rate (breaths/min): 18 Body Mass Index (BMI): 32.8 Blood Pressure (mmHg): 100/60 Reference Range: 80 - 120 mg / dl Electronic Signature(s) Signed: 01/25/2022 4:37:42 PM By: Levora Dredge Entered By: Levora Dredge on 01/25/2022 15:13:45

## 2022-01-25 NOTE — Progress Notes (Addendum)
Narvaiz, Soniyah E. ([id number]) Visit Report for 01/25/2022 Chief Complaint Document Details Patient Name: Heather Lopez, Heather E. Date of Service: 01/25/2022 3:00 PM Medical Record Number: [id number] Patient Account Number: [id number] Date of Birth/Sex: 10/27/1946 (76 y.o. F) Treating RN: Levora Dredge Primary Care Provider: [name] Other Clinician: Referring Provider: [name] Treating Provider/Extender: Jeri Cos Weeks in Treatment: 1 Information Obtained from: Patient Chief Complaint Sacral pressure ulcer Electronic Signature(s) Signed: 01/25/2022 3:03:36 PM By: Worthy Keeler PA-C Entered By: Worthy Keeler on 01/25/2022 15:03:36 Heather Lopez, Heather E. ([id number]) -------------------------------------------------------------------------------- HPI Details Patient Name: [place]el[place], Anelle E. Date of Service: 01/25/2022 3:00 PM Medical Record Number: [id number] Patient Account Number: [id number] Date of Birth/Sex: 09-30-1946 (76 y.o. F) Treating RN: Levora Dredge Primary Care Provider: [name] Other Clinician: Referring Provider: [name] Treating Provider/Extender: Skipper Cliche in Treatment: 1 History of Present Illness HPI Description: 01/16/2022 upon evaluation patient appears to be doing somewhat poorly in regard to her wound in the sacral region which has been present for about 6 months after she was unfortunately in the ICU for period of time. Subsequently the patient actually ended up having a fall this was in July 2022 and her health has been declining slowly since that time. Overall she was also in the hospital for an extended stay in January where she was diagnosed as having osteomyelitis she was treated for this in the skilled nursing facility, [facility]. Apparently that treatment is complete for the osteomyelitis of the sacral region. With that being said she is continue to have a wound VAC though based on what I am seeing I do not believe this wound VAC has been  appropriately packed into the depth of the wound this is quite deep considering how small it is. Patient does have a history of diabetes mellitus type 2, hypertension, and generalized muscle weakness. She also needs her right hip replaced but they will not do this until the wound is healed. 01/25/2022 upon evaluation today patient appears to be doing pretty well currently in regard to the sacral wound. This actually does not appear to be as open as it was previous. It still does have a bit of depth here and again that something that was still good to be managing but as far as the overall size of the wound from a opening standpoint this is much better than last week is also less friable than when I saw her last. Fortunately I do not see any evidence of active infection locally or systemically which is great news. Electronic Signature(s) Signed: 01/25/2022 5:13:04 PM By: Worthy Keeler PA-C Entered By: Worthy Keeler on 01/25/2022 17:13:04 Heather Lopez, Heather E. ([id number]) -------------------------------------------------------------------------------- Physical Exam Details Patient Name: Heather Lopez, Heather E. Date of Service: 01/25/2022 3:00 PM Medical Record Number: [id number] Patient Account Number: [id number] Date of Birth/Sex: 10-01-1946 (76 y.o. F) Treating RN: Levora Dredge Primary Care Provider: [name] Other Clinician: Referring Provider: [name] Treating Provider/Extender: Jeri Cos Weeks in Treatment: 1 Constitutional Well-nourished and well-hydrated in no acute distress. Respiratory normal breathing without difficulty. Psychiatric this patient is able to make decisions and demonstrates good insight into disease process. Alert and Oriented x 3. pleasant and cooperative. Notes Upon inspection patient's wound bed actually showed signs of good granulation and epithelization at this point. Fortunately I do not see any evidence of active infection locally or systemically which is great  news and overall very pleased with where we stand. Electronic Signature(s) Signed: 01/25/2022 5:13:18 PM By: Worthy Keeler  PA-C Entered By: Worthy Keeler on 01/25/2022 17:13:18 Heather Lopez, Heather E. ([id number]) -------------------------------------------------------------------------------- Physician Orders Details Patient Name: Heather Lopez, Heather E. Date of Service: 01/25/2022 3:00 PM Medical Record Number: [id number] Patient Account Number: [id number] Date of Birth/Sex: 06-25-1946 (76 y.o. F) Treating RN: Levora Dredge Primary Care Provider: [name] Other Clinician: Referring Provider: [name] Treating Provider/Extender: Skipper Cliche in Treatment: 1 Verbal / Phone Orders: No Diagnosis Coding ICD-10 Coding Code Description L89.154 Pressure ulcer of sacral region, stage 4 E11.622 Type 2 diabetes mellitus with other skin ulcer I10 Essential (primary) hypertension M62.81 Muscle weakness (generalized) Follow-up Appointments o Return Appointment in 2 weeks. o Nurse Visit as needed [place] for wound care. May utilize formulary equivalent dressing for wound treatment orders unless otherwise specified. Home Health Nurse may visit [facility] to address patientos wound care needs. o Scheduled days for dressing changes to be completed; exception, patient has scheduled wound care visit that day. o **Please direct any NON-WOUND related issues/requests for orders to patient's Primary Care Physician. **If current dressing causes regression in wound condition, may D/C ordered dressing product/s and apply Normal Saline Moist Dressing daily until next [place] or Other MD appointment. **Notify [facility] of regression in wound condition at [phone]. o Other Home Health Orders/Instructions: Licensed conveyancer o Wash wounds with antibacterial soap and water. o May shower; gently  cleanse wound with antibacterial soap, rinse and pat dry prior to dressing wounds o No tub bath. Off-Loading o Turn and reposition every 2 hours Negative Pressure Wound Therapy o Place NPWT on HOLD. Wound Treatment Wound #1 - Sacrum Wound Laterality: Midline Cleanser: Wound Cleanser 1 x Per Day/30 Days Discharge Instructions: Wash your hands with soap and water. Remove old dressing, discard into plastic bag and place into trash. Cleanse the wound with Wound Cleanser prior to applying a clean dressing using gauze sponges, not tissues or cotton balls. Do not scrub or use excessive force. Pat dry using gauze sponges, not tissue or cotton balls. Primary Dressing: Hydrofera Blue Classic Foam Rope Dressing, 9x6 (mm/in) 1 x Per Day/30 Days Discharge Instructions: rope cut 1/4, cut 4cm long Secondary Dressing: Gauze 1 x Per Day/30 Days Discharge Instructions: 4x4 cut in half and folded Secondary Dressing: Zetuvit Plus Silicone Border Dressing 4x4 (in/in) 1 x Per Day/30 Days Electronic Signature(s) Signed: 01/25/2022 4:37:42 PM By: Levora Dredge Signed: 01/25/2022 5:22:07 PM By: Worthy Keeler PA-C Entered By: Levora Dredge on 01/25/2022 16:13:45 Heather Lopez, Heather E. ([id number]) Heather Lopez, Tajha E. ([id number]) -------------------------------------------------------------------------------- Problem List Details Patient Name: Heather Lopez, Heather E. Date of Service: 01/25/2022 3:00 PM Medical Record Number: [id number] Patient Account Number: [id number] Date of Birth/Sex: October 18, 1946 (76 y.o. F) Treating RN: Levora Dredge Primary Care Provider: [name] Other Clinician: Referring Provider: [name] Treating Provider/Extender: Jeri Cos Weeks in Treatment: 1 Active Problems ICD-10 Encounter Code Description Active Date MDM Diagnosis L89.154 Pressure ulcer of sacral region, stage 4 01/16/2022 No Yes E11.622 Type 2 diabetes mellitus with other skin ulcer 01/16/2022 No Yes I10 Essential (primary)  hypertension 01/16/2022 No Yes M62.81 Muscle weakness (generalized) 01/16/2022 No Yes Inactive Problems Resolved Problems Electronic Signature(s) Signed: 01/25/2022 3:03:33 PM By: Worthy Keeler PA-C Entered By: Worthy Keeler on 01/25/2022 15:03:32 Heather Lopez, Heather E. ([id number]) -------------------------------------------------------------------------------- Progress Note Details Patient Name: Heather Lopez, Heather E. Date of Service: 01/25/2022 3:00 PM Medical Record Number: [id number] Patient Account Number: [id number] Date of Birth/Sex: [birth year] (75 y.o.  F) Treating RN: Levora Dredge Primary Care Provider: [name] Other Clinician: Referring Provider: [name] Treating Provider/Extender: Skipper Cliche in Treatment: 1 Subjective Chief Complaint Information obtained from Patient Sacral pressure ulcer History of Present Illness (HPI) 01/16/2022 upon evaluation patient appears to be doing somewhat poorly in regard to her wound in the sacral region which has been present for about 6 months after she was unfortunately in the ICU for period of time. Subsequently the patient actually ended up having a fall this was in July 2022 and her health has been declining slowly since that time. Overall she was also in the hospital for an extended stay in January where she was diagnosed as having osteomyelitis she was treated for this in the skilled nursing facility, [facility]. Apparently that treatment is complete for the osteomyelitis of the sacral region. With that being said she is continue to have a wound VAC though based on what I am seeing I do not believe this wound VAC has been appropriately packed into the depth of the wound this is quite deep considering how small it is. Patient does have a history of diabetes mellitus type 2, hypertension, and generalized muscle weakness. She also needs her right hip replaced but they will not do this until the wound is healed. 01/25/2022 upon  evaluation today patient appears to be doing pretty well currently in regard to the sacral wound. This actually does not appear to be as open as it was previous. It still does have a bit of depth here and again that something that was still good to be managing but as far as the overall size of the wound from a opening standpoint this is much better than last week is also less friable than when I saw her last. Fortunately I do not see any evidence of active infection locally or systemically which is great news. Objective Constitutional Well-nourished and well-hydrated in no acute distress. Vitals Time Taken: 3:10 PM, Height: 63 in, Weight: 185 lbs, BMI: 32.8, Temperature: 97.9 F, Pulse: 88 bpm, Respiratory Rate: 18 breaths/Heather Lopez, Blood Pressure: 100/60 mmHg. Respiratory normal breathing without difficulty. Psychiatric this patient is able to make decisions and demonstrates good insight into disease process. Alert and Oriented x 3. pleasant and cooperative. General Notes: Upon inspection patient's wound bed actually showed signs of good granulation and epithelization at this point. Fortunately I do not see any evidence of active infection locally or systemically which is great news and overall very pleased with where we stand. Integumentary (Hair, Skin) Wound #1 status is Open. Original cause of wound was Pressure Injury. The date acquired was: 08/10/2021. The wound has been in treatment 1 weeks. The wound is located on the Midline Sacrum. The wound measures 0.5cm length x 0.3cm width x 3cm depth; 0.118cm^2 area and 0.353cm^3 volume. There is Fat Layer (Subcutaneous Tissue) exposed. There is no tunneling or undermining noted. There is a medium amount of serosanguineous drainage noted. There is medium (34-66%) red, pink granulation within the wound bed. There is a small (1-33%) amount of necrotic tissue within the wound bed. Assessment Active Problems ICD-10 Heather Lopez, Heather E. ([id number]) Pressure  ulcer of sacral region, stage 4 Type 2 diabetes mellitus with other skin ulcer Essential (primary) hypertension Muscle weakness (generalized) Plan Follow-up Appointments: Return Appointment in 2 weeks. Nurse Visit as needed Home Health: [place]: - [place] for wound care. May utilize formulary equivalent dressing for wound treatment orders unless otherwise specified. Home Health  Nurse may visit [facility] to address patient s wound care needs. Scheduled days for dressing changes to be completed; exception, patient has scheduled wound care visit that day. **Please direct any NON-WOUND related issues/requests for orders to patient's Primary Care Physician. **If current dressing causes regression in wound condition, may D/C ordered dressing product/s and apply Normal Saline Moist Dressing daily until next [place] or Other MD appointment. **Notify [facility] of regression in wound condition at [phone]. Other Home Health Orders/Instructions: Bathing/ Shower/ Hygiene: Wash wounds with antibacterial soap and water. May shower; gently cleanse wound with antibacterial soap, rinse and pat dry prior to dressing wounds No tub bath. Off-Loading: Turn and reposition every 2 hours Negative Pressure Wound Therapy: Place NPWT on HOLD. WOUND #1: - Sacrum Wound Laterality: Midline Cleanser: Wound Cleanser 1 x Per Day/30 Days Discharge Instructions: Wash your hands with soap and water. Remove old dressing, discard into plastic bag and place into trash. Cleanse the wound with Wound Cleanser prior to applying a clean dressing using gauze sponges, not tissues or cotton balls. Do not scrub or use excessive force. Pat dry using gauze sponges, not tissue or cotton balls. Primary Dressing: Hydrofera Blue Classic Foam Rope Dressing, 9x6 (mm/in) 1 x Per Day/30 Days Discharge Instructions: rope cut 1/4, cut 4cm long Secondary Dressing: Gauze 1 x  Per Day/30 Days Discharge Instructions: 4x4 cut in half and folded Secondary Dressing: Zetuvit Plus Silicone Border Dressing 4x4 (in/in) 1 x Per Day/30 Days 1. I am going to suggest that we going continue with the wound care measures as before and the patient is in agreement with plan. This includes the use of the Hydrofera Blue rope. We are going to cut this into a fourth to put in I think that is good to be ideal. Half is much too big at this point. I knew it was not to take long for [place] to get to the point of needing to come back but I am very pleased that it happened quickly. 2. I am going to recommend that we continue to monitor for any signs of infection or worsening. 3. She should also continue to appropriately offload keeping pressure off the area as much as possible. We will see patient back for reevaluation in 1 week here in the clinic. If anything worsens or changes patient will contact our office for additional recommendations. Electronic Signature(s) Signed: 01/25/2022 5:15:05 PM By: Worthy Keeler PA-C Entered By: Worthy Keeler on 01/25/2022 17:15:05 Heather Lopez, Heather E. ([id number]) -------------------------------------------------------------------------------- SuperBill Details Patient Name: Heather Lopez, Heather E. Date of Service: 01/25/2022 Medical Record Number: [id number] Patient Account Number: [id number] Date of Birth/Sex: 01-21-1946 (76 y.o. F) Treating RN: Levora Dredge Primary Care Provider: [name] Other Clinician: Referring Provider: [name] Treating Provider/Extender: Jeri Cos Weeks in Treatment: 1 Diagnosis Coding ICD-10 Codes Code Description [phone] Pressure ulcer of sacral region, stage 4 E11.622 Type 2 diabetes mellitus with other skin ulcer I10 Essential (primary) hypertension M62.81 Muscle weakness (generalized) Facility Procedures CPT4 Code: [id number] Description: 323 [id number] 2021 - WOUND CARE VISIT-LEV 2 EST PT Modifier: Quantity: 1 Physician  Procedures CPT4 Code: [id number] Description: 11941 - WC PHYS LEVEL 4 - EST PT Modifier: Quantity: 1 CPT4 Code: Description: ICD-10 Diagnosis Description L89.154 Pressure ulcer of sacral region, stage 4 E11.622 Type 2 diabetes mellitus with other skin ulcer I10 Essential (primary) hypertension M62.81 Muscle weakness (generalized) Modifier: Quantity: Electronic Signature(s) Signed: 01/25/2022 5:15:54 PM By: Worthy Keeler PA-C Previous Signature: 01/25/2022 4:12:06 PM Version By: Araceli Bouche,  Caitlin Entered By: Worthy Keeler on 01/25/2022 17:15:54

## 2022-02-02 ENCOUNTER — Encounter: Payer: Self-pay | Admitting: Urology

## 2022-02-10 ENCOUNTER — Encounter: Payer: Self-pay | Admitting: Emergency Medicine

## 2022-02-10 ENCOUNTER — Other Ambulatory Visit: Payer: Self-pay

## 2022-02-10 ENCOUNTER — Telehealth: Payer: Self-pay | Admitting: Family Medicine

## 2022-02-10 ENCOUNTER — Ambulatory Visit
Admission: EM | Admit: 2022-02-10 | Discharge: 2022-02-10 | Disposition: A | Discharge status: Home / Self Care | Payer: Medicare Other | Attending: Family Medicine | Admitting: Family Medicine

## 2022-02-10 ENCOUNTER — Emergency Department
Admission: EM | Admit: 2022-02-10 | Discharge: 2022-02-10 | Disposition: A | Discharge status: Home / Self Care | Payer: Medicare Other | Attending: Emergency Medicine | Admitting: Emergency Medicine

## 2022-02-10 DIAGNOSIS — I10 Essential (primary) hypertension: Secondary | ICD-10-CM | POA: Insufficient documentation

## 2022-02-10 DIAGNOSIS — E119 Type 2 diabetes mellitus without complications: Secondary | ICD-10-CM | POA: Insufficient documentation

## 2022-02-10 DIAGNOSIS — N390 Urinary tract infection, site not specified: Secondary | ICD-10-CM

## 2022-02-10 DIAGNOSIS — R7989 Other specified abnormal findings of blood chemistry: Secondary | ICD-10-CM | POA: Diagnosis present

## 2022-02-10 DIAGNOSIS — R300 Dysuria: Secondary | ICD-10-CM | POA: Diagnosis present

## 2022-02-10 DIAGNOSIS — E860 Dehydration: Secondary | ICD-10-CM | POA: Diagnosis not present

## 2022-02-10 LAB — POCT URINALYSIS DIP (MANUAL ENTRY)
Bilirubin, UA: NEGATIVE
Glucose, UA: NEGATIVE mg/dL
Ketones, POC UA: NEGATIVE mg/dL
Nitrite, UA: NEGATIVE
Protein Ur, POC: >=300 mg/dL — AB
Spec Grav, UA: 1.02 (ref 1.010–1.025)
Urobilinogen, UA: 0.2 E.U./dL
pH, UA: 6 (ref 5.0–8.0)

## 2022-02-10 LAB — COMPREHENSIVE METABOLIC PANEL
ALT: 18 IU/L (ref 0–32)
AST: 15 IU/L (ref 0–40)
Albumin/Globulin Ratio: 1.2 (ref 1.2–2.2)
Albumin: 3.8 g/dL (ref 3.7–4.7)
Alkaline Phosphatase: 161 IU/L — ABNORMAL HIGH (ref 44–121)
BUN/Creatinine Ratio: 18 (ref 12–28)
BUN: 45 mg/dL — ABNORMAL HIGH (ref 8–27)
Bilirubin Total: <0.2 mg/dL (ref 0.0–1.2)
CO2: 20 mmol/L (ref 20–29)
Calcium: 9.8 mg/dL (ref 8.7–10.3)
Chloride: 102 mmol/L (ref 96–106)
Creatinine, Ser: 2.52 mg/dL — ABNORMAL HIGH (ref 0.57–1.00)
Globulin, Total: 3.1 g/dL (ref 1.5–4.5)
Glucose: 166 mg/dL — ABNORMAL HIGH (ref 70–99)
Potassium: 5.1 mmol/L (ref 3.5–5.2)
Sodium: 137 mmol/L (ref 134–144)
Total Protein: 6.9 g/dL (ref 6.0–8.5)
eGFR: 19 mL/min/{1.73_m2} — ABNORMAL LOW (ref >59)

## 2022-02-10 LAB — CBC WITH DIFFERENTIAL/PLATELET
Basophils Absolute: 0.1 10*3/uL (ref 0.0–0.2)
Basos: 1 %
EOS (ABSOLUTE): 0.2 10*3/uL (ref 0.0–0.4)
Eos: 2 %
Hematocrit: 35.3 % (ref 34.0–46.6)
Hemoglobin: 11.3 g/dL (ref 11.1–15.9)
Immature Grans (Abs): 0.1 10*3/uL (ref 0.0–0.1)
Immature Granulocytes: 1 %
Lymphocytes Absolute: 2 10*3/uL (ref 0.7–3.1)
Lymphs: 19 %
MCH: 26.7 pg (ref 26.6–33.0)
MCHC: 32 g/dL (ref 31.5–35.7)
MCV: 84 fL (ref 79–97)
Monocytes Absolute: 0.9 10*3/uL (ref 0.1–0.9)
Monocytes: 8 %
Neutrophils Absolute: 7.4 10*3/uL — ABNORMAL HIGH (ref 1.4–7.0)
Neutrophils: 69 %
Platelets: 355 10*3/uL (ref 150–450)
RBC: 4.23 x10E6/uL (ref 3.77–5.28)
RDW: 16.8 % — ABNORMAL HIGH (ref 11.7–15.4)
WBC: 10.6 10*3/uL (ref 3.4–10.8)

## 2022-02-10 MED ORDER — FLUCONAZOLE 150 MG PO TABS: 150.0000 mg | ORAL_TABLET | Freq: Every day | ORAL | 0 refills | Status: DC

## 2022-02-10 MED ORDER — CEPHALEXIN 500 MG PO CAPS: 500.0000 mg | ORAL_CAPSULE | Freq: Two times a day (BID) | ORAL | 0 refills | Status: DC

## 2022-02-10 NOTE — ED Notes (Addendum)
FIRST NURSE NOTE: Per pt, [place] called her and told her to come to the emergency department for abnormal labs, pt's BUN and Cre are elevated. Pt was seen at [facility] this AM for UTI symptoms, BMP, CBC, and urine was done.  ?

## 2022-02-10 NOTE — Telephone Encounter (Signed)
Spoke with patient regarding blood work drawn today and she was advised to go to the ER due to her kidney functions and the possibility of her being septic again. Pt advised and verbalized understanding.  ?

## 2022-02-10 NOTE — ED Triage Notes (Signed)
Pt c/o dysuria and urinary frequency x 1 week  ?

## 2022-02-10 NOTE — ED Provider Notes (Signed)
? ?[facility] ?Provider Note ? ? ? Event Date/Time  ? First MD Initiated Contact with Patient 02/10/22 1445   ?  (approximate) ? ? ?History  ? ?Abnormal labs ? ? ?HPI ? ?Heather Lopez is a 76 y.o. female with a history of diabetes, hypertension, UTI, additional past medical history as detailed above who presents with abnormal lab results.  Patient reports that she has had dysuria and urinary frequency, went to urgent care this morning and was found to have a urinary tract infection.  Was prescribed antibiotics.  They also drew labs and subsequently called her later and told her to come to the emergency department because of abnormal kidney function.  She feels well overall besides dysuria and urinary frequency.  No fevers chills or back pain.  No abdominal pain.  No nausea or vomiting. ?  ? ? ?Physical Exam  ? ?Triage Vital Signs: ?ED Triage Vitals  ?Enc Vitals Group  ?   BP 02/10/22 1439 129/61  ?   Pulse Rate 02/10/22 1439 84  ?   Resp 02/10/22 1439 18  ?   Temp 02/10/22 1439 98.5 ?F (36.9 ?C)  ?   Temp Source 02/10/22 1439 Oral  ?   SpO2 02/10/22 1439 97 %  ?   Weight --   ?   Height 02/10/22 1440 1.6 m ('5\' 3"'$ )  ?   Head Circumference --   ?   [place] --   ?   Pain Score 02/10/22 1440 5  ?   Pain Loc --   ?   Pain Edu? --   ?   Excl. in [place]? --   ? ? ?Most recent vital signs: ?Vitals:  ? 02/10/22 1439  ?BP: 129/61  ?Pulse: 84  ?Resp: 18  ?Temp: 98.5 ?F (36.9 ?C)  ?SpO2: 97%  ? ? ? ?General: Awake, no distress.  ?CV:  Good peripheral perfusion.  ?Resp:  Normal effort.  ?Abd:  No distention.  ?Other:   ? ? ?ED Results / Procedures / Treatments  ? ?Labs ?(all labs ordered are listed, but only abnormal results are displayed) ?Labs Reviewed - No data to display ? ? ?EKG ? ? ? ? ?RADIOLOGY ? ? ? ? ?PROCEDURES: ? ?Critical Care performed:  ? ?Procedures ? ? ?MEDICATIONS ORDERED IN ED: ?Medications - No data to display ? ? ?IMPRESSION / MDM / ASSESSMENT AND PLAN / ED COURSE  ?I reviewed the triage  vital signs and the nursing notes. ? ?I reviewed urgent care visit from today and labs performed earlier today. ? ?Patient sent for abnormal creatinine of 2.52, CBC demonstrates normal white blood cell count ? ?Elevated BUN of 45.  Reviewed creatinine from the last 6 months.  2.52 is not significantly outside of her typical levels.  She varies from 1.35-2.58 sometimes within a month ? ?She is afebrile here, at her baseline.  They did prescribe Keflex and fluconazole for her which I recommended that she do take. ? ?I offered to give IV fluids but she reports she is a very difficult stick and she is happy to know that further work-up is not necessary.  She has follow-up on Wednesday where she will have her creatinine rechecked.  I encouraged her to increase p.o. hydration in the meantime.  She and her husband agree with this plan ? ? ?  ? ? ?FINAL CLINICAL IMPRESSION(S) / ED DIAGNOSES  ? ?Final diagnoses:  ?Dehydration  ? ? ? ?Rx / DC Orders  ? ?  ED Discharge Orders   ? ? None  ? ?  ? ? ? ?Note:  This document was prepared using Dragon voice recognition software and may include unintentional dictation errors. ?  ?Lavonia Drafts, MD ?02/10/22 1459 ? ?

## 2022-02-10 NOTE — ED Provider Notes (Signed)
Heather Lopez    CSN: [id number] Arrival date & time: 02/10/22  0831      History   Chief Complaint Chief Complaint  Patient presents with   Dysuria   Urinary Frequency    HPI Heather Lopez is a 76 y.o. female.   HPI Second for sepsis due to UTI, diabetes type 2, chronic sacral ulcer, history of AKI, CKD stage III presents today with 1 week history of worsening pain with urination, increasingly cloudy urine, and urine frequency.  Past Medical History:  Diagnosis Date   Anxiety    Cataract    Depression    Diabetes mellitus without complication (HCC)    Gout    History of kidney stones    Hyperlipidemia    Hypertension    Vertigo     Patient Active Problem List   Diagnosis Date Noted   Decubitus ulcer of sacral region, stage 4 (HCC) 12/19/2021   Lactic acidosis 12/19/2021   Status post placement of ureteral stent 12/09/2021 12/19/2021   AKI (acute kidney injury) (HCC) 12/19/2021   UTI (urinary tract infection) 12/02/2021   Chronic osteomyelitis of coccyx (HCC) 12/02/2021   Abdominal pain 12/02/2021   Nausea & vomiting 12/02/2021   Hyperkalemia 12/02/2021   Hyponatremia 12/02/2021   Hyperglycemia due to diabetes mellitus (HCC) 12/02/2021   Elevated alkaline phosphatase level 12/02/2021   Elevated lipase 12/02/2021   Hypoalbuminemia due to protein-calorie malnutrition (HCC) 12/02/2021   Obesity (BMI 30-39.9) 12/02/2021   Anxiety 10/10/2021   Primary localized osteoarthritis of pelvic region and thigh 10/10/2021   Benign hypertensive kidney disease with chronic kidney disease 09/26/2021   Secondary hyperparathyroidism of renal origin (HCC) 09/26/2021   Hypokalemia 07/20/2021   Renal abscess    Essential hypertension    Lethargy    Duodenal ulcer perforation (HCC)    Demand ischemia of myocardium (HCC)    Atrial flutter with rapid ventricular response (HCC) 07/07/2021   Endotracheally intubated 07/07/2021   Bilateral nephrolithiasis 07/07/2021   Acute  on chronic anemia 07/07/2021   Pressure injury of skin 06/30/2021   Pneumoperitoneum    Acute respiratory failure with hypoxia (HCC) 06/27/2021   Severe sepsis with septic shock (HCC) 06/27/2021   Acute lower UTI 06/27/2021   Acute kidney injury superimposed on CKD (HCC) 06/27/2021   Diabetes mellitus, type II (HCC) 03/03/2018   Vitamin D deficiency 08/21/2016   IBS (irritable bowel syndrome) 08/21/2016   Special screening for malignant neoplasms, colon    Benign neoplasm of transverse colon    Benign neoplasm of sigmoid colon    Uncontrolled type 2 diabetes mellitus with insulin therapy (HCC) 07/12/2015   Benign hypertension with CKD (chronic kidney disease) stage III (HCC) 07/12/2015   Hyperlipidemia associated with type 2 diabetes mellitus (HCC) 07/12/2015   Depression 07/12/2015   BMI 40.0-44.9, adult (HCC) 07/12/2015    Past Surgical History:  Procedure Laterality Date   CATARACT EXTRACTION, BILATERAL Bilateral 2019   COLONOSCOPY WITH PROPOFOL N/A 08/19/2015   Procedure: COLONOSCOPY WITH PROPOFOL;  Surgeon: Midge Minium, MD;  Location: [facility];  Service: Endoscopy;  Laterality: N/A;  diabetic - insulin   CYSTOSCOPY W/ RETROGRADES Bilateral 12/22/2019   Procedure: CYSTOSCOPY WITH RETROGRADE PYELOGRAM;  Surgeon: Riki Altes, MD;  Location: [facility];  Service: Urology;  Laterality: Bilateral;   CYSTOSCOPY W/ URETERAL STENT PLACEMENT Bilateral 07/05/2021   Procedure: CYSTOSCOPY WITH RETROGRADE PYELOGRAM/BILATERAL URETERAL STENT PLACEMENT;  Surgeon: Riki Altes, MD;  Location: [facility];  Service: Urology;  Laterality: Bilateral;   CYSTOSCOPY W/ URETERAL STENT PLACEMENT Bilateral 12/26/2021   Procedure: CYSTOSCOPY WITH STENT REMOVAL;  Surgeon: Riki Altes, MD;  Location: [facility];  Service: Urology;  Laterality: Bilateral;   CYSTOSCOPY WITH BIOPSY N/A 12/22/2019   Procedure: CYSTOSCOPY WITH bladder BIOPSY;  Surgeon: Riki Altes, MD;  Location: [facility];   Service: Urology;  Laterality: N/A;   CYSTOSCOPY WITH STENT PLACEMENT Right 12/22/2019   Procedure: CYSTOSCOPY WITH STENT PLACEMENT;  Surgeon: Riki Altes, MD;  Location: [facility];  Service: Urology;  Laterality: Right;   CYSTOSCOPY WITH URETEROSCOPY Bilateral 12/22/2019   Procedure: CYSTOSCOPY WITH URETEROSCOPY;  Surgeon: Riki Altes, MD;  Location: [facility];  Service: Urology;  Laterality: Bilateral;   CYSTOSCOPY/URETEROSCOPY/HOLMIUM LASER/STENT PLACEMENT Right 12/22/2019   Procedure: CYSTOSCOPY/URETEROSCOPY/HOLMIUM LASER/STENT PLACEMENT;  Surgeon: Riki Altes, MD;  Location: [facility];  Service: Urology;  Laterality: Right;   CYSTOSCOPY/URETEROSCOPY/HOLMIUM LASER/STENT PLACEMENT Bilateral 12/08/2021   Procedure: CYSTOSCOPY/URETEROSCOPY/HOLMIUM LASER/STENT PLACEMENT;  Surgeon: Sondra Come, MD;  Location: [facility];  Service: Urology;  Laterality: Bilateral;   DIALYSIS/PERMA CATHETER INSERTION N/A 06/29/2021   Procedure: DIALYSIS/PERMA CATHETER INSERTION;  Surgeon: Annice Needy, MD;  Location: [facility];  Service: Cardiovascular;  Laterality: N/A;   DIALYSIS/PERMA CATHETER INSERTION N/A 07/13/2021   Procedure: DIALYSIS/PERMA CATHETER INSERTION;  Surgeon: Annice Needy, MD;  Location: [facility];  Service: Cardiovascular;  Laterality: N/A;   EYE SURGERY     IR GASTROSTOMY TUBE MOD SED  08/01/2021   LAPAROTOMY N/A 06/29/2021   Procedure: EXPLORATORY LAPAROTOMY WITH REPAIR OF DUODENAL PERFORATION;  Surgeon: Henrene Dodge, MD;  Location: [facility];  Service: General;  Laterality: N/A;   POLYPECTOMY  08/19/2015   Procedure: POLYPECTOMY;  Surgeon: Midge Minium, MD;  Location: [facility];  Service: Endoscopy;;   TUBAL LIGATION  1992    OB History   No obstetric history on file.      Home Medications    Prior to Admission medications   Medication Sig Start Date End Date Taking? Authorizing Provider  aspirin 81 MG EC tablet Take by mouth.    [provider]  buPROPion (WELLBUTRIN) 100 MG tablet Take 100 mg by mouth daily.    [provider]  busPIRone (BUSPAR) 5 MG tablet Place 5 mg into feeding tube daily.    [provider]  cloNIDine (CATAPRES) 0.1 MG tablet Take 1 tablet (0.1 mg total) by mouth 2 (two) times daily. 12/11/21   Enedina Finner, MD  fluticasone (FLONASE) 50 MCG/ACT nasal spray Place 1 spray into both nostrils daily.    [provider]  insulin aspart (NOVOLOG) 100 UNIT/ML injection Inject 0-10 Units into the skin 3 (three) times daily before meals.    [provider]  insulin detemir (LEVEMIR) 100 UNIT/ML injection Inject 0.3 mLs (30 Units total) into the skin 2 (two) times daily. 08/03/21   Marrion Coy, MD  iron polysaccharides (NIFEREX) 150 MG capsule Take 1 capsule (150 mg total) by mouth daily. 12/11/21   Enedina Finner, MD  loperamide (IMODIUM A-D) 2 MG tablet Take 2 mg by mouth every 6 (six) hours as needed for diarrhea or loose stools.    [provider]  metoprolol tartrate (LOPRESSOR) 100 MG tablet Take 1 tablet (100 mg total) by mouth 2 (two) times daily. 12/11/21   Enedina Finner, MD  Multiple Vitamin (MULTIVITAMIN WITH MINERALS) TABS tablet Take 1 tablet by mouth daily. 12/12/21   Enedina Finner, MD  polyethylene glycol (MIRALAX / GLYCOLAX) 17 g packet Take 17 g by mouth daily.    [provider]  senna-docusate (SENOKOT-S) 8.6-50 MG tablet Take 1 tablet by mouth 2 (two) times daily.    [provider]  sitaGLIPtin (JANUVIA) 25 MG tablet Place 1 tablet (25 mg total) into feeding tube daily. Patient taking differently: Take 25 mg by mouth daily. 12/11/21   Enedina Finner, MD    Family History Family History  Problem Relation Age of Onset   Diabetes Father    Cancer Father        lung cancer   Diabetes Brother    Stroke Mother     Social History Social History   Tobacco Use   Smoking status: Former    Packs/day: 0.75    Years: 6.00    Pack years: 4.50     Types: Cigarettes    Quit date: 1973    Years since quitting: 50.2   Smokeless tobacco: Never   Tobacco comments:    quit 1973  Vaping Use   Vaping Use: Never used  Substance Use Topics   Alcohol use: No    Alcohol/week: 0.0 standard drinks   Drug use: No     Allergies   Contrast media  [iodinated contrast media] and Iodine   Review of Systems Review of Systems   Physical Exam Triage Vital Signs ED Triage Vitals [02/10/22 0910]  Enc Vitals Group     BP 113/70     Pulse Rate 82     Resp 18     Temp 98.4 F (36.9 C)     Temp src      SpO2 98 %     Weight      Height      Head Circumference      Peak Flow      Pain Score      Pain Loc      Pain Edu?      Excl. in GC?    No data found.  Updated Vital Signs BP 113/70 (BP Location: Right Arm)   Pulse 82   Temp 98.4 F (36.9 C)   Resp 18   SpO2 98%   Visual Acuity Right Eye Distance:   Left Eye Distance:   Bilateral Distance:    Right Eye Near:   Left Eye Near:    Bilateral Near:     Physical Exam Constitutional:      General: She is awake.     Appearance: She is morbidly obese.  HENT:     Head: Normocephalic and atraumatic.  Eyes:     Pupils: Pupils are equal, round, and reactive to light.  Cardiovascular:     Rate and Rhythm: Normal rate and regular rhythm.  Pulmonary:     Effort: Pulmonary effort is normal.     Breath sounds: Normal breath sounds.  Skin:    General: Skin is dry.     Comments: Scaly   Neurological:     Mental Status: Mental status is at baseline.     Comments: Patient is nonambulatory and in a wheelchair.  Psychiatric:        Behavior: Behavior is cooperative.     UC Treatments / Results  Labs (all labs ordered are listed, but only abnormal results are displayed) Labs Reviewed  POCT URINALYSIS DIP (MANUAL ENTRY) - Abnormal; Notable for the following components:      Result Value   Clarity, UA cloudy (*)  Blood, UA large (*)    Protein Ur, POC >=300 (*)     Leukocytes, UA Large (3+) (*)    All other components within normal limits  URINE CULTURE    EKG   Radiology No results found.  Procedures Procedures (including critical care time)  Medications Ordered in UC Medications - No data to display  Initial Impression / Assessment and Plan / UC Course  I have reviewed the triage vital signs and the nursing notes.  Pertinent labs & imaging results that were available during my care of the patient were reviewed by me and considered in my medical decision making (see chart for details).  Patient with a history of urinary tract disease and recent history of sepsis and AKI presents today with a 1 week history of urinary tract infection.  CMP to evaluate creatinine functioning along with electrolytes pending along with CBC pending we will go ahead and initiate antibiotic therapy with Keflex to cover for UTI.  Labs were sent stat upon receipt of results advised patient if anything is abnormal we will contact her to advise of follow-up.  Patient is stable nontoxic-appearing discharged with strict ER precautions. Final Clinical Impressions(s) / UC Diagnoses   Final diagnoses:  Dysuria  Recurrent UTI     Discharge Instructions      Someone from our office will contact you if your blood work is abnormal. In the meantime start Keflex take twice daily for total of 10 days.  We will contact you if your urine culture results indicate the need for additional treatment.  Hydrate well with fluids.  If you develop any fever, nausea, vomiting or gross bleeding with urination this is indication to go immediately to the emergency room.    ED Prescriptions     Medication Sig Dispense Auth. Provider   cephALEXin (KEFLEX) 500 MG capsule Take 1 capsule (500 mg total) by mouth 2 (two) times daily. 20 capsule Bing Neighbors, FNP   fluconazole (DIFLUCAN) 150 MG tablet Take 1 tablet (150 mg total) by mouth daily. 2 tablet Bing Neighbors, FNP       PDMP not reviewed this encounter.   Bing Neighbors, FNP 02/10/22 1021

## 2022-02-10 NOTE — Discharge Instructions (Addendum)
Someone from our office will contact you if your blood work is abnormal. ?In the meantime start Keflex take twice daily for total of 10 days.  We will contact you if your urine culture results indicate the need for additional treatment.  Hydrate well with fluids.  If you develop any fever, nausea, vomiting or gross bleeding with urination this is indication to go immediately to the emergency room. ?

## 2022-02-10 NOTE — ED Triage Notes (Signed)
Patient to ER via POV with abnormal lab values. Had lab work completed this [facility] at [facility] and was advised to come to the ER due to elevated BUN/ creatinine. Patient previously dialysis patient but completed last session in September due to improving numbers.  ? ?Patient also positive for a UTI. Reports urinary frequency and dysuria.  ?

## 2022-02-12 ENCOUNTER — Other Ambulatory Visit: Payer: Self-pay

## 2022-02-12 ENCOUNTER — Encounter: Payer: Medicare Other | Admitting: Physician Assistant

## 2022-02-12 DIAGNOSIS — E11622 Type 2 diabetes mellitus with other skin ulcer: Secondary | ICD-10-CM | POA: Diagnosis not present

## 2022-02-12 LAB — URINE CULTURE: Special Requests: NORMAL

## 2022-02-12 NOTE — Progress Notes (Addendum)
Fregeau, Maurita E. ([id number]) ?Visit Report for 02/12/2022 ?Arrival Information Details ?Patient Name: Heather Lopez, Heather E. ?Date of Service: 02/12/2022 3:00 PM ?Medical Record Number: [id number] ?Patient Account Number: [id number] ?Date of Birth/Sex: 1946/01/16 (76 y.o. F) ?Treating RN: Sharyn Heather Lopez ?Primary Care Duong Haydel: [name] Other Clinician: ?Referring Yuridiana Formanek: [name] ?Treating Lesha Jager/Extender: Jeri Cos ?Weeks in Treatment: 3 ?Visit Information History Since Last Visit ?Added or deleted any medications: No ?Patient Arrived: Wheel Chair ?Any new allergies or adverse reactions: No ?Arrival Time: 15:09 ?Had a fall or experienced change in No ?Accompanied By: husband ?activities of daily living that may affect ?Transfer Assistance: Other ?risk of falls: ?Patient Identification Verified: Yes ?Signs or symptoms of abuse/neglect since last visito No ?Secondary Verification Process Completed: Yes ?Hospitalized since last visit: No ?Patient Has Alerts: Yes ?Implantable device outside of the clinic excluding No ?Patient Alerts: Patient on Blood Thinner ?cellular tissue based products placed in the center ?Type 2 diabetic ?since last visit: ?Has Dressing in Place as Prescribed: Yes ?Pain Present Now: Yes ?Notes ?transfer by husband using gait belt/easy pivot ?Electronic Signature(s) ?Signed: 02/12/2022 3:58:24 PM By: Sharyn Creamer RN, BSN ?Entered By: Sharyn Heather Lopez on 02/12/2022 15:11:05 ?Heather Lopez, Heather E. ([id number]) ?-------------------------------------------------------------------------------- ?Clinic Level of Care Assessment Details ?Patient Name: Heather Lopez, Heather E. ?Date of Service: 02/12/2022 3:00 PM ?Medical Record Number: [id number] ?Patient Account Number: [id number] ?Date of Birth/Sex: [birth year] (76 y.o. F) ?Treating RN: Carlene Coria ?Primary Care Athaliah Baumbach: [name] Other Clinician: ?Referring Sharlee Rufino: [name] ?Treating Annajulia Lewing/Extender: Jeri Cos ?Weeks in Treatment: 3 ?Clinic Level of Care  Assessment Items ?TOOL 4 Quantity Score ?X - Use when only an EandM is performed on FOLLOW-UP visit 1 0 ?ASSESSMENTS - Nursing Assessment / Reassessment ?X - Reassessment of Co-morbidities (includes updates in patient status) 1 10 ?X- 1 5 ?Reassessment of Adherence to Treatment Plan ?ASSESSMENTS - Wound and Skin Assessment / Reassessment ?X - Simple Wound Assessment / Reassessment - one wound 1 5 ?'[]'$  - 0 ?Complex Wound Assessment / Reassessment - multiple wounds ?'[]'$  - 0 ?Dermatologic / Skin Assessment (not related to wound area) ?ASSESSMENTS - Focused Assessment ?'[]'$  - Circumferential Edema Measurements - multi extremities 0 ?'[]'$  - 0 ?Nutritional Assessment / Counseling / Intervention ?'[]'$  - 0 ?Lower Extremity Assessment (monofilament, tuning fork, pulses) ?'[]'$  - 0 ?Peripheral Arterial Disease Assessment (using hand held doppler) ?ASSESSMENTS - Ostomy and/or Continence Assessment and Care ?'[]'$  - Incontinence Assessment and Management 0 ?'[]'$  - 0 ?Ostomy Care Assessment and Management (repouching, etc.) ?PROCESS - Coordination of Care ?X - Simple Patient / Family Education for ongoing care 1 15 ?'[]'$  - 0 ?Complex (extensive) Patient / Family Education for ongoing care ?'[]'$  - 0 ?Staff obtains Consents, Records, Test Results / Process Orders ?'[]'$  - 0 ?Staff telephones HHA, Nursing Homes / Clarify orders / etc ?'[]'$  - 0 ?Routine Transfer to another Facility (non-emergent condition) ?'[]'$  - 0 ?Routine Hospital Admission (non-emergent condition) ?'[]'$  - 0 ?New Admissions / Biomedical engineer / Ordering NPWT, Apligraf, etc. ?'[]'$  - 0 ?Emergency Hospital Admission (emergent condition) ?X- 1 10 ?Simple Discharge Coordination ?'[]'$  - 0 ?Complex (extensive) Discharge Coordination ?PROCESS - Special Needs ?'[]'$  - Pediatric / Minor Patient Management 0 ?'[]'$  - 0 ?Isolation Patient Management ?'[]'$  - 0 ?Hearing / Language / Visual special needs ?'[]'$  - 0 ?Assessment of Community assistance (transportation, D/C planning, etc.) ?'[]'$  - 0 ?Additional  assistance / Altered mentation ?'[]'$  - 0 ?Support Surface(s) Assessment (bed, cushion, seat, etc.) ?INTERVENTIONS - Wound Cleansing / Measurement ?Heather Lopez, Heather E. ([id number]) ?X-  1 5 ?Simple Wound Cleansing - one wound ?'[]'$  - 0 ?Complex Wound Cleansing - multiple wounds ?X- 1 5 ?Wound Imaging (photographs - any number of wounds) ?'[]'$  - 0 ?Wound Tracing (instead of photographs) ?X- 1 5 ?Simple Wound Measurement - one wound ?'[]'$  - 0 ?Complex Wound Measurement - multiple wounds ?INTERVENTIONS - Wound Dressings ?X - Small Wound Dressing one or multiple wounds 1 10 ?'[]'$  - 0 ?Medium Wound Dressing one or multiple wounds ?'[]'$  - 0 ?Large Wound Dressing one or multiple wounds ?'[]'$  - 0 ?Application of Medications - topical ?'[]'$  - 0 ?Application of Medications - injection ?INTERVENTIONS - Miscellaneous ?'[]'$  - External ear exam 0 ?'[]'$  - 0 ?Specimen Collection (cultures, biopsies, blood, body fluids, etc.) ?'[]'$  - 0 ?Specimen(s) / Culture(s) sent or taken to Lab for analysis ?'[]'$  - 0 ?Patient Transfer (multiple staff / Civil Service fast streamer / Similar devices) ?'[]'$  - 0 ?Simple Staple / Suture removal (25 or less) ?'[]'$  - 0 ?Complex Staple / Suture removal (26 or more) ?'[]'$  - 0 ?Hypo / Hyperglycemic Management (close monitor of Blood Glucose) ?'[]'$  - 0 ?Ankle / Brachial Index (ABI) - do not check if billed separately ?X- 1 5 ?Vital Signs ?Has the patient been seen at the hospital within the last three years: Yes ?Total Score: 75 ?Level Of Care: New/Established - Level ?2 ?Electronic Signature(s) ?Signed: 02/12/2022 3:57:12 PM By: Carlene Coria RN ?Entered By: Carlene Coria on 02/12/2022 15:48:06 ?Heather Lopez, Heather E. ([id number]) ?-------------------------------------------------------------------------------- ?Encounter Discharge Information Details ?Patient Name: Heather Lopez, Heather E. ?Date of Service: 02/12/2022 3:00 PM ?Medical Record Number: [id number] ?Patient Account Number: [id number] ?Date of Birth/Sex: 1946-03-16 (76 y.o. F) ?Treating RN: Carlene Coria ?Primary Care Valeen Borys:  [name] Other Clinician: ?Referring Kamika Goodloe: [name] ?Treating Edwards Mckelvie/Extender: Jeri Cos ?Weeks in Treatment: 3 ?Encounter Discharge Information Items ?Discharge Condition: Stable ?Ambulatory Status: Wheelchair ?Discharge Destination: Home ?Transportation: Private Auto ?Accompanied By: husband ?Schedule Follow-up Appointment: Yes ?Clinical Summary of Care: Patient Declined ?Electronic Signature(s) ?Signed: 02/12/2022 3:48:49 PM By: Carlene Coria RN ?Entered ByCarlene Coria on 02/12/2022 15:48:48 ?Heather Lopez, Heather E. ([id number]) ?-------------------------------------------------------------------------------- ?Lower Extremity Assessment Details ?Patient Name: Heather Lopez, Heather E. ?Date of Service: 02/12/2022 3:00 PM ?Medical Record Number: [id number] ?Patient Account Number: [id number] ?Date of Birth/Sex: 1946-03-28 (76 y.o. F) ?Treating RN: Sharyn Heather Lopez ?Primary Care Jonathon Tan: [name] Other Clinician: ?Referring Andilyn Bettcher: [name] ?Treating Jayma Volpi/Extender: Jeri Cos ?Weeks in Treatment: 3 ?Electronic Signature(s) ?Signed: 02/12/2022 3:58:24 PM By: Sharyn Creamer RN, BSN ?Entered By: Sharyn Heather Lopez on 02/12/2022 15:17:31 ?Heather Lopez, Heather E. ([id number]) ?-------------------------------------------------------------------------------- ?Multi Wound Chart Details ?Patient Name: Heather Lopez, Heather E. ?Date of Service: 02/12/2022 3:00 PM ?Medical Record Number: [id number] ?Patient Account Number: [id number] ?Date of Birth/Sex: [birth year] (76 y.o. F) ?Treating RN: Sharyn Heather Lopez ?Primary Care Maximus Hoffert: [name] Other Clinician: ?Referring Ahliya Glatt: [name] ?Treating Kanya Potteiger/Extender: Jeri Cos ?Weeks in Treatment: 3 ?Vital Signs ?Height(in): 63 ?Pulse(bpm): 80 ?Weight(lbs): 185 ?Blood Pressure(mmHg): 139/75 ?Body Mass Index(BMI): 32.8 ?Temperature(??F): 98.2 ?Respiratory Rate(breaths/min): 18 ?Photos: [N/A:N/A] ?Wound Location: Midline Sacrum N/A N/A ?Wounding Event: Pressure Injury N/A  N/A ?Primary Etiology: Pressure Ulcer N/A N/A ?Comorbid History: Cataracts, Arrhythmia, N/A N/A ?Hypertension, Type II Diabetes, ?Osteoarthritis, Osteomyelitis ?Date Acquired: 08/10/2021 N/A N/A ?Weeks of Treatment: 3 N/A N

## 2022-02-12 NOTE — Progress Notes (Addendum)
Crossland, Stellar E. ([id number]) ?Visit Report for 02/12/2022 ?Chief Complaint Document Details ?Patient Name: Heather Lopez, Heather E. ?Date of Service: 02/12/2022 3:00 PM ?Medical Record Number: [id number] ?Patient Account Number: [id number] ?Date of Birth/Sex: 05-11-1946 (76 y.o. F) ?Treating RN: Sharyn Creamer ?Primary Care Provider: [name] Other Clinician: ?Referring Provider: [name] ?Treating Provider/Extender: Jeri Cos ?Weeks in Treatment: 3 ?Information Obtained from: Patient ?Chief Complaint ?Sacral pressure ulcer ?Electronic Signature(s) ?Signed: 02/12/2022 2:51:42 PM By: Worthy Keeler PA-C ?Entered By: Worthy Keeler on 02/12/2022 14:51:42 ?[place], [place] ([id number]) ?-------------------------------------------------------------------------------- ?HPI Details ?Patient Name: Heather Lopez, Heather E. ?Date of Service: 02/12/2022 3:00 PM ?Medical Record Number: [id number] ?Patient Account Number: [id number] ?Date of Birth/Sex: May 31, 1946 (76 y.o. F) ?Treating RN: Sharyn Creamer ?Primary Care Provider: [name] Other Clinician: ?Referring Provider: [name] ?Treating Provider/Extender: Jeri Cos ?Weeks in Treatment: 3 ?History of Present Illness ?HPI Description: 01/16/2022 upon evaluation patient appears to be doing somewhat poorly in regard to her wound in the sacral region which has ?been present for about 6 months after she was unfortunately in the ICU for period of time. Subsequently the patient actually ended up having a ?fall this was in July 2022 and her health has been declining slowly since that time. Overall she was also in the hospital for an extended stay in ?January where she was diagnosed as having osteomyelitis she was treated for this in the skilled nursing facility, [facility]. Apparently that ?treatment is complete for the osteomyelitis of the sacral region. With that being said she is continue to have a wound VAC though based on what I ?am seeing I do not believe this wound VAC has been  appropriately packed into the depth of the wound this is quite deep considering how small it ?is. ?Patient does have a history of diabetes mellitus type 2, hypertension, and generalized muscle weakness. She also needs her right hip replaced but ?they will not do this until the wound is healed. ?01/25/2022 upon evaluation today patient appears to be doing pretty well currently in regard to the sacral wound. This actually does not appear to ?be as open as it was previous. It still does have a bit of depth here and again that something that was still good to be managing but as far as the ?overall size of the wound from a opening standpoint this is much better than last week is also less friable than when I saw her last. Fortunately I ?do not see any evidence of active infection locally or systemically which is great news. ?02/12/2022 upon evaluation today patient appears to actually be doing quite well in regard to her wound. She has been tolerating the dressing ?changes without complication. Fortunately there does not appear to be any signs of active infection locally nor systemically at this time which is ?great news. No fevers, chills, nausea, vomiting, or diarrhea. ?Electronic Signature(s) ?Signed: 02/12/2022 4:16:54 PM By: Worthy Keeler PA-C ?Entered By: Worthy Keeler on 02/12/2022 16:16:54 ?Heather Lopez, Heather E. ([id number]) ?-------------------------------------------------------------------------------- ?Physical Exam Details ?Patient Name: Heather Lopez, Heather E. ?Date of Service: 02/12/2022 3:00 PM ?Medical Record Number: [id number] ?Patient Account Number: [id number] ?Date of Birth/Sex: 06/11/1946 (76 y.o. F) ?Treating RN: Sharyn Creamer ?Primary Care Provider: [name] Other Clinician: ?Referring Provider: [name] ?Treating Provider/Extender: Jeri Cos ?Weeks in Treatment: 3 ?Constitutional ?Well-nourished and well-hydrated in no acute distress. ?Respiratory ?normal breathing without difficulty. ?Psychiatric ?this  patient is able to make decisions and demonstrates good insight into disease process. Alert and Oriented x 3.  pleasant and cooperative. ?Notes ?Upon inspection patient's wound bed actually showed signs of good granulation and epithelization at this point. Fortunately I do not see any ?evidence of active infection locally which is great the wound is extremely small in diameter and at this point I do not think we will get a be able to ?even get the [facility] in to be honest. I think that packing this with some endoform and then just having her husband change the outer ?covering dressing every so often is probably the best way to go here. ?Electronic Signature(s) ?Signed: 02/12/2022 4:17:24 PM By: Worthy Keeler PA-C ?Entered By: Worthy Keeler on 02/12/2022 16:17:23 ?Heather Lopez, Heather E. ([id number]) ?-------------------------------------------------------------------------------- ?Physician Orders Details ?Patient Name: Heather Lopez, Heather E. ?Date of Service: 02/12/2022 3:00 PM ?Medical Record Number: [id number] ?Patient Account Number: [id number] ?Date of Birth/Sex: 1946-08-13 (76 y.o. F) ?Treating RN: Sharyn Creamer ?Primary Care Provider: [name] Other Clinician: ?Referring Provider: [name] ?Treating Provider/Extender: Jeri Cos ?Weeks in Treatment: 3 ?Verbal / Phone Orders: No ?Diagnosis Coding ?ICD-10 Coding ?Code Description ?L89.154 Pressure ulcer of sacral region, stage 4 ?E11.622 Type 2 diabetes mellitus with other skin ulcer ?I10 Essential (primary) hypertension ?M62.81 Muscle weakness (generalized) ?Follow-up Appointments ?o Return Appointment in 2 weeks. ?o Nurse Visit as needed ?Home Health ?o DISCONTINUE Home Health for Wound Care. ?Licensed conveyancer ?o Wash wounds with antibacterial soap and water. ?o May shower; gently cleanse wound with antibacterial soap, rinse and pat dry prior to dressing wounds ?o No tub bath. ?Off-Loading ?o Turn and reposition every 2 hours ?Negative  Pressure Wound Therapy ?o Discontinue NPWT. ?Wound Treatment ?Wound #1 - Sacrum Wound Laterality: Midline ?Cleanser: Wound Cleanser 1 x Per Day/30 Days ?Discharge Instructions: Wash your hands with soap and water. Remove old dressing, discard into plastic bag and place into trash. ?Cleanse the wound with Wound Cleanser prior to applying a clean dressing using gauze sponges, not tissues or cotton balls. Do not ?scrub or use excessive force. Pat dry using gauze sponges, not tissue or cotton balls. ?Primary Dressing: Endoform Antimicrobial, 2x2 (in/in) 1 x Per Day/30 Days ?Discharge Instructions: packed into wound just in clinic ?Secondary Dressing: (SILICONE BORDER) Zetuvit Plus SILICONE BORDER Dressing 4x4 (in/in) 1 x Per Day/30 Days ?Electronic Signature(s) ?Signed: 02/12/2022 3:58:24 PM By: Sharyn Creamer RN, BSN ?Signed: 02/12/2022 4:18:52 PM By: Worthy Keeler PA-C ?Entered By: Sharyn Creamer on 02/12/2022 15:37:47 ?Heather Lopez, Heather E. ([id number]) ?-------------------------------------------------------------------------------- ?Problem List Details ?Patient Name: Heather Lopez, Heather E. ?Date of Service: 02/12/2022 3:00 PM ?Medical Record Number: [id number] ?Patient Account Number: [id number] ?Date of Birth/Sex: 25-Oct-1946 (76 y.o. F) ?Treating RN: Sharyn Creamer ?Primary Care Provider: [name] Other Clinician: ?Referring Provider: [name] ?Treating Provider/Extender: Jeri Cos ?Weeks in Treatment: 3 ?Active Problems ?ICD-10 ?Encounter ?Code Description Active Date MDM ?Diagnosis ?L89.154 Pressure ulcer of sacral region, stage 4 01/16/2022 No Yes ?E11.622 Type 2 diabetes mellitus with other skin ulcer 01/16/2022 No Yes ?I10 Essential (primary) hypertension 01/16/2022 No Yes ?M62.81 Muscle weakness (generalized) 01/16/2022 No Yes ?Inactive Problems ?Resolved Problems ?Electronic Signature(s) ?Signed: 02/12/2022 2:51:32 PM By: Worthy Keeler PA-C ?Entered By: Worthy Keeler on 02/12/2022 14:51:31 ?Heather Lopez, Heather E.  ([id number]) ?-------------------------------------------------------------------------------- ?Progress Note Details ?Patient Name: Heather Lopez, Heather E. ?Date of Service: 02/12/2022 3:00 PM ?Medical Record Number: [id number]

## 2022-02-14 ENCOUNTER — Other Ambulatory Visit: Payer: Self-pay

## 2022-02-14 ENCOUNTER — Inpatient Hospital Stay: Payer: Medicare Other | Attending: Nurse Practitioner

## 2022-02-14 ENCOUNTER — Inpatient Hospital Stay (HOSPITAL_BASED_OUTPATIENT_CLINIC_OR_DEPARTMENT_OTHER): Payer: Medicare Other | Admitting: Nurse Practitioner

## 2022-02-14 ENCOUNTER — Inpatient Hospital Stay: Payer: Medicare Other

## 2022-02-14 VITALS — BP 120/65 | HR 88 | Temp 99.0°F | Ht 63.0 in | Wt 190.0 lb

## 2022-02-14 DIAGNOSIS — N184 Chronic kidney disease, stage 4 (severe): Secondary | ICD-10-CM | POA: Insufficient documentation

## 2022-02-14 DIAGNOSIS — E611 Iron deficiency: Secondary | ICD-10-CM | POA: Diagnosis present

## 2022-02-14 DIAGNOSIS — R5381 Other malaise: Secondary | ICD-10-CM | POA: Insufficient documentation

## 2022-02-14 DIAGNOSIS — Z801 Family history of malignant neoplasm of trachea, bronchus and lung: Secondary | ICD-10-CM | POA: Diagnosis not present

## 2022-02-14 DIAGNOSIS — D631 Anemia in chronic kidney disease: Secondary | ICD-10-CM

## 2022-02-14 DIAGNOSIS — Z87891 Personal history of nicotine dependence: Secondary | ICD-10-CM | POA: Diagnosis not present

## 2022-02-14 DIAGNOSIS — L89159 Pressure ulcer of sacral region, unspecified stage: Secondary | ICD-10-CM | POA: Diagnosis not present

## 2022-02-14 DIAGNOSIS — D649 Anemia, unspecified: Secondary | ICD-10-CM

## 2022-02-14 LAB — CBC WITH DIFFERENTIAL/PLATELET
Abs Immature Granulocytes: 0.03 10*3/uL (ref 0.00–0.07)
Basophils Absolute: 0 10*3/uL (ref 0.0–0.1)
Basophils Relative: 0 %
Eosinophils Absolute: 0.1 10*3/uL (ref 0.0–0.5)
Eosinophils Relative: 1 %
HCT: 35 % — ABNORMAL LOW (ref 36.0–46.0)
Hemoglobin: 11 g/dL — ABNORMAL LOW (ref 12.0–15.0)
Immature Granulocytes: 0 %
Lymphocytes Relative: 20 %
Lymphs Abs: 2 10*3/uL (ref 0.7–4.0)
MCH: 27.2 pg (ref 26.0–34.0)
MCHC: 31.4 g/dL (ref 30.0–36.0)
MCV: 86.4 fL (ref 80.0–100.0)
Monocytes Absolute: 0.7 10*3/uL (ref 0.1–1.0)
Monocytes Relative: 7 %
Neutro Abs: 7 10*3/uL (ref 1.7–7.7)
Neutrophils Relative %: 72 %
Platelets: 399 10*3/uL (ref 150–400)
RBC: 4.05 MIL/uL (ref 3.87–5.11)
RDW: 17.8 % — ABNORMAL HIGH (ref 11.5–15.5)
WBC: 10 10*3/uL (ref 4.0–10.5)
nRBC: 0 % (ref 0.0–0.2)

## 2022-02-14 LAB — COMPREHENSIVE METABOLIC PANEL
ALT: 19 U/L (ref 0–44)
AST: 17 U/L (ref 15–41)
Albumin: 3.5 g/dL (ref 3.5–5.0)
Alkaline Phosphatase: 135 U/L — ABNORMAL HIGH (ref 38–126)
Anion gap: 10 (ref 5–15)
BUN: 49 mg/dL — ABNORMAL HIGH (ref 8–23)
CO2: 22 mmol/L (ref 22–32)
Calcium: 9.5 mg/dL (ref 8.9–10.3)
Chloride: 101 mmol/L (ref 98–111)
Creatinine, Ser: 2.24 mg/dL — ABNORMAL HIGH (ref 0.44–1.00)
GFR, Estimated: 22 mL/min — ABNORMAL LOW (ref >60)
Glucose, Bld: 144 mg/dL — ABNORMAL HIGH (ref 70–99)
Potassium: 4.8 mmol/L (ref 3.5–5.1)
Sodium: 133 mmol/L — ABNORMAL LOW (ref 135–145)
Total Bilirubin: 0.3 mg/dL (ref 0.3–1.2)
Total Protein: 8 g/dL (ref 6.5–8.1)

## 2022-02-14 LAB — IRON AND TIBC
Iron: 31 ug/dL (ref 28–170)
Saturation Ratios: 11 % (ref 10.4–31.8)
TIBC: 281 ug/dL (ref 250–450)
UIBC: 250 ug/dL

## 2022-02-14 LAB — FERRITIN: Ferritin: 91 ng/mL (ref 11–307)

## 2022-02-14 NOTE — Progress Notes (Signed)
[place] ?CONSULT NOTE ? ?Patient Care Team: ?Joesphine Bare as PCP - General (Physician Assistant) ?Cammie Sickle, MD as Consulting Physician (Hematology and Oncology) ? ?CHIEF COMPLAINTS/PURPOSE OF CONSULTATION: anemia ? ?# Anemia chronic kidney disease ? ?# September 2022-septic shock/obstructive uropathy/perforated duodenum/perirenal abscess s/p drain/[place] UTI.  ? ?HISTORY OF PRESENTING ILLNESS: in a wheel chair; with husband ? ?Heather Lopez 76 y.o.  female with history of anemia of CKD who returns to clinic for follow up and consideration of retacrit. She has returned to home and is slowly strength improving. Has not required EPO since January. Complains of significant sacral pain and would like to expedite appointment if possible. Can only sit for 30 minutes at a time d/t pain.  ? ? ?Review of Systems  ?Constitutional:  Positive for malaise/fatigue. Negative for chills, diaphoresis, fever and weight loss.  ?HENT:  Negative for congestion, ear discharge, ear pain, nosebleeds, sinus pain and sore throat.   ?Eyes:  Negative for pain and redness.  ?Respiratory:  Negative for cough, hemoptysis, sputum production, shortness of breath, wheezing and stridor.   ?Cardiovascular:  Negative for chest pain, palpitations and leg swelling.  ?Gastrointestinal:  Negative for abdominal pain, constipation, diarrhea, nausea and vomiting.  ?Musculoskeletal:  Negative for falls, joint pain and myalgias.  ?     Sacral pain  ?Skin:  Negative for rash.  ?Neurological:  Positive for weakness. Negative for dizziness, loss of consciousness and headaches.  ?Psychiatric/Behavioral:  Negative for depression. The patient is not nervous/anxious and does not have insomnia.   ?All other systems reviewed and are negative.  ? ? ?MEDICAL HISTORY:  ?Past Medical History:  ?Diagnosis Date  ? Anxiety   ? Cataract   ? Depression   ? Diabetes mellitus without complication ([place])   ? Gout   ? History of kidney stones   ?  Hyperlipidemia   ? Hypertension   ? Vertigo   ? ? ?SURGICAL HISTORY: ?Past Surgical History:  ?Procedure Laterality Date  ? CATARACT EXTRACTION, BILATERAL Bilateral 2019  ? COLONOSCOPY WITH PROPOFOL N/A 08/19/2015  ? Procedure: COLONOSCOPY WITH PROPOFOL;  Surgeon: Lucilla Lame, MD;  Location: [place];  Service: Endoscopy;  Laterality: N/A;  diabetic - insulin  ? CYSTOSCOPY W/ RETROGRADES Bilateral 12/22/2019  ? Procedure: CYSTOSCOPY WITH RETROGRADE PYELOGRAM;  Surgeon: Abbie Sons, MD;  Location: [facility];  Service: Urology;  Laterality: Bilateral;  ? CYSTOSCOPY W/ URETERAL STENT PLACEMENT Bilateral 07/05/2021  ? Procedure: CYSTOSCOPY WITH RETROGRADE PYELOGRAM/BILATERAL URETERAL STENT PLACEMENT;  Surgeon: Abbie Sons, MD;  Location: [facility];  Service: Urology;  Laterality: Bilateral;  ? CYSTOSCOPY W/ URETERAL STENT PLACEMENT Bilateral 12/26/2021  ? Procedure: CYSTOSCOPY WITH STENT REMOVAL;  Surgeon: Abbie Sons, MD;  Location: [facility];  Service: Urology;  Laterality: Bilateral;  ? CYSTOSCOPY WITH BIOPSY N/A 12/22/2019  ? Procedure: CYSTOSCOPY WITH bladder BIOPSY;  Surgeon: Abbie Sons, MD;  Location: [facility];  Service: Urology;  Laterality: N/A;  ? CYSTOSCOPY WITH STENT PLACEMENT Right 12/22/2019  ? Procedure: CYSTOSCOPY WITH STENT PLACEMENT;  Surgeon: Abbie Sons, MD;  Location: [facility];  Service: Urology;  Laterality: Right;  ? CYSTOSCOPY WITH URETEROSCOPY Bilateral 12/22/2019  ? Procedure: CYSTOSCOPY WITH URETEROSCOPY;  Surgeon: Abbie Sons, MD;  Location: [facility];  Service: Urology;  Laterality: Bilateral;  ? CYSTOSCOPY/URETEROSCOPY/HOLMIUM LASER/STENT PLACEMENT Right 12/22/2019  ? Procedure: CYSTOSCOPY/URETEROSCOPY/HOLMIUM LASER/STENT PLACEMENT;  Surgeon: Abbie Sons, MD;  Location: [facility];  Service: Urology;  Laterality: Right;  ?  CYSTOSCOPY/URETEROSCOPY/HOLMIUM LASER/STENT PLACEMENT Bilateral 12/08/2021  ? Procedure: CYSTOSCOPY/URETEROSCOPY/HOLMIUM LASER/STENT  PLACEMENT;  Surgeon: [name], MD;  Location: [facility];  Service: Urology;  Laterality: Bilateral;  ? DIALYSIS/PERMA CATHETER INSERTION N/A 06/29/2021  ? Procedure: DIALYSIS/PERMA CATHETER INSERTION;  Surgeon: Algernon Huxley, MD;  Location: [facility];  Service: Cardiovascular;  Laterality: N/A;  ? DIALYSIS/PERMA CATHETER INSERTION N/A 07/13/2021  ? Procedure: DIALYSIS/PERMA CATHETER INSERTION;  Surgeon: Algernon Huxley, MD;  Location: [facility];  Service: Cardiovascular;  Laterality: N/A;  ? EYE SURGERY    ? IR GASTROSTOMY TUBE MOD SED  08/01/2021  ? LAPAROTOMY N/A 06/29/2021  ? Procedure: EXPLORATORY LAPAROTOMY WITH REPAIR OF DUODENAL PERFORATION;  Surgeon: [name], MD;  Location: [facility];  Service: General;  Laterality: N/A;  ? POLYPECTOMY  08/19/2015  ? Procedure: POLYPECTOMY;  Surgeon: Lucilla Lame, MD;  Location: [place];  Service: Endoscopy;;  ? [place]  ? ? ?SOCIAL HISTORY: ?Social History  ? ?Socioeconomic History  ? Marital status: Married  ?  Spouse name: Not on file  ? Number of children: Not on file  ? Years of education: Not on file  ? Highest education level: Not on file  ?Occupational History  ? Not on file  ?Tobacco Use  ? Smoking status: Former  ?  Packs/day: 0.75  ?  Years: 6.00  ?  Pack years: 4.50  ?  Types: Cigarettes  ?  Quit date: 21  ?  Years since quitting: 50.2  ? Smokeless tobacco: Never  ? Tobacco comments:  ?  quit 1973  ?Vaping Use  ? Vaping Use: Never used  ?Substance and Sexual Activity  ? Alcohol use: No  ?  Alcohol/week: 0.0 standard drinks  ? Drug use: No  ? Sexual activity: Not on file  ?Other Topics Concern  ? Not on file  ?Social History Narrative  ? Not on file  ? ?Social Determinants of Health  ? ?Financial Resource Strain: Not on file  ?Food Insecurity: Not on file  ?Transportation Needs: Not on file  ?Physical Activity: Not on file  ?Stress: Not on file  ?Social Connections: Not on file  ?Intimate Partner Violence: Not on  file  ? ? ?FAMILY HISTORY: ?Family History  ?Problem Relation Age of Onset  ? Diabetes Father   ? Cancer Father   ?     lung cancer  ? Diabetes Brother   ? Stroke Mother   ? ? ?ALLERGIES:  is allergic to contrast media  [iodinated contrast media] and iodine. ? ?MEDICATIONS:  ?Current Outpatient Medications  ?Medication Sig Dispense Refill  ? aspirin 81 MG EC tablet Take by mouth.    ? buPROPion (WELLBUTRIN) 100 MG tablet Take 100 mg by mouth daily.    ? busPIRone (BUSPAR) 5 MG tablet Place 5 mg into feeding tube daily.    ? cephALEXin (KEFLEX) 500 MG capsule Take 1 capsule (500 mg total) by mouth 2 (two) times daily. 20 capsule 0  ? cloNIDine (CATAPRES) 0.1 MG tablet Take 1 tablet (0.1 mg total) by mouth 2 (two) times daily. 60 tablet 11  ? fluconazole (DIFLUCAN) 150 MG tablet Take 1 tablet (150 mg total) by mouth daily. 2 tablet 0  ? fluticasone (FLONASE) 50 MCG/ACT nasal spray Place 1 spray into both nostrils daily.    ? insulin aspart (NOVOLOG) 100 UNIT/ML injection Inject 0-10 Units into the skin 3 (three) times daily before meals.    ? insulin detemir (LEVEMIR)  100 UNIT/ML injection Inject 0.3 mLs (30 Units total) into the skin 2 (two) times daily. 10 mL 11  ? iron polysaccharides (NIFEREX) 150 MG capsule Take 1 capsule (150 mg total) by mouth daily. 30 capsule 4  ? loperamide (IMODIUM A-D) 2 MG tablet Take 2 mg by mouth every 6 (six) hours as needed for diarrhea or loose stools.    ? metoprolol tartrate (LOPRESSOR) 100 MG tablet Take 1 tablet (100 mg total) by mouth 2 (two) times daily. 60 tablet 2  ? Multiple Vitamin (MULTIVITAMIN WITH MINERALS) TABS tablet Take 1 tablet by mouth daily. 30 tablet 3  ? polyethylene glycol (MIRALAX / GLYCOLAX) 17 g packet Take 17 g by mouth daily.    ? senna-docusate (SENOKOT-S) 8.6-50 MG tablet Take 1 tablet by mouth 2 (two) times daily.    ? sitaGLIPtin (JANUVIA) 25 MG tablet Place 1 tablet (25 mg total) into feeding tube daily. (Patient taking differently: Take 25 mg by  mouth daily.) 90 tablet 1  ? ?No current facility-administered medications for this visit.  ? ?. ? ?PHYSICAL EXAMINATION: ? ?Vitals:  ? 02/14/22 1302  ?BP: 120/65  ?Pulse: 88  ?Temp: 99 ?F (37.2 ?C)  ?SpO2: 100%

## 2022-02-20 ENCOUNTER — Other Ambulatory Visit: Payer: Self-pay

## 2022-02-20 ENCOUNTER — Observation Stay
Admission: EM | Admit: 2022-02-20 | Discharge: 2022-02-20 | Disposition: A | Discharge status: Home / Self Care | Payer: Medicare Other | Attending: Obstetrics and Gynecology | Admitting: Obstetrics and Gynecology

## 2022-02-20 DIAGNOSIS — Z7982 Long term (current) use of aspirin: Secondary | ICD-10-CM | POA: Insufficient documentation

## 2022-02-20 DIAGNOSIS — Z87891 Personal history of nicotine dependence: Secondary | ICD-10-CM | POA: Insufficient documentation

## 2022-02-20 DIAGNOSIS — E1122 Type 2 diabetes mellitus with diabetic chronic kidney disease: Secondary | ICD-10-CM | POA: Insufficient documentation

## 2022-02-20 DIAGNOSIS — N184 Chronic kidney disease, stage 4 (severe): Secondary | ICD-10-CM | POA: Diagnosis not present

## 2022-02-20 DIAGNOSIS — N390 Urinary tract infection, site not specified: Secondary | ICD-10-CM | POA: Diagnosis not present

## 2022-02-20 DIAGNOSIS — I129 Hypertensive chronic kidney disease with stage 1 through stage 4 chronic kidney disease, or unspecified chronic kidney disease: Secondary | ICD-10-CM | POA: Insufficient documentation

## 2022-02-20 DIAGNOSIS — Z794 Long term (current) use of insulin: Secondary | ICD-10-CM | POA: Insufficient documentation

## 2022-02-20 DIAGNOSIS — R300 Dysuria: Secondary | ICD-10-CM | POA: Diagnosis present

## 2022-02-20 DIAGNOSIS — N183 Chronic kidney disease, stage 3 unspecified: Secondary | ICD-10-CM | POA: Diagnosis present

## 2022-02-20 DIAGNOSIS — Z6841 Body Mass Index (BMI) 40.0 and over, adult: Secondary | ICD-10-CM

## 2022-02-20 DIAGNOSIS — Z79899 Other long term (current) drug therapy: Secondary | ICD-10-CM | POA: Diagnosis not present

## 2022-02-20 DIAGNOSIS — B965 Pseudomonas (aeruginosa) (mallei) (pseudomallei) as the cause of diseases classified elsewhere: Secondary | ICD-10-CM | POA: Diagnosis not present

## 2022-02-20 DIAGNOSIS — E119 Type 2 diabetes mellitus without complications: Secondary | ICD-10-CM

## 2022-02-20 DIAGNOSIS — L8990 Pressure ulcer of unspecified site, unspecified stage: Secondary | ICD-10-CM | POA: Diagnosis present

## 2022-02-20 LAB — CBC WITH DIFFERENTIAL/PLATELET
Abs Immature Granulocytes: 0.03 10*3/uL (ref 0.00–0.07)
Basophils Absolute: 0.1 10*3/uL (ref 0.0–0.1)
Basophils Relative: 1 %
Eosinophils Absolute: 0.2 10*3/uL (ref 0.0–0.5)
Eosinophils Relative: 3 %
HCT: 34.1 % — ABNORMAL LOW (ref 36.0–46.0)
Hemoglobin: 10.5 g/dL — ABNORMAL LOW (ref 12.0–15.0)
Immature Granulocytes: 0 %
Lymphocytes Relative: 29 %
Lymphs Abs: 2.2 10*3/uL (ref 0.7–4.0)
MCH: 26.6 pg (ref 26.0–34.0)
MCHC: 30.8 g/dL (ref 30.0–36.0)
MCV: 86.5 fL (ref 80.0–100.0)
Monocytes Absolute: 0.7 10*3/uL (ref 0.1–1.0)
Monocytes Relative: 10 %
Neutro Abs: 4.3 10*3/uL (ref 1.7–7.7)
Neutrophils Relative %: 57 %
Platelets: 435 10*3/uL — ABNORMAL HIGH (ref 150–400)
RBC: 3.94 MIL/uL (ref 3.87–5.11)
RDW: 17.6 % — ABNORMAL HIGH (ref 11.5–15.5)
WBC: 7.5 10*3/uL (ref 4.0–10.5)
nRBC: 0 % (ref 0.0–0.2)

## 2022-02-20 LAB — COMPREHENSIVE METABOLIC PANEL
ALT: 19 U/L (ref 0–44)
AST: 28 U/L (ref 15–41)
Albumin: 3.5 g/dL (ref 3.5–5.0)
Alkaline Phosphatase: 131 U/L — ABNORMAL HIGH (ref 38–126)
Anion gap: 11 (ref 5–15)
BUN: 51 mg/dL — ABNORMAL HIGH (ref 8–23)
CO2: 22 mmol/L (ref 22–32)
Calcium: 10 mg/dL (ref 8.9–10.3)
Chloride: 102 mmol/L (ref 98–111)
Creatinine, Ser: 2.5 mg/dL — ABNORMAL HIGH (ref 0.44–1.00)
GFR, Estimated: 20 mL/min — ABNORMAL LOW (ref >60)
Glucose, Bld: 128 mg/dL — ABNORMAL HIGH (ref 70–99)
Potassium: 5.2 mmol/L — ABNORMAL HIGH (ref 3.5–5.1)
Sodium: 135 mmol/L (ref 135–145)
Total Bilirubin: 0.8 mg/dL (ref 0.3–1.2)
Total Protein: 7.9 g/dL (ref 6.5–8.1)

## 2022-02-20 LAB — URINALYSIS, ROUTINE W REFLEX MICROSCOPIC
Bacteria, UA: NONE SEEN
Bilirubin Urine: NEGATIVE
Glucose, UA: NEGATIVE mg/dL
Ketones, ur: NEGATIVE mg/dL
Nitrite: NEGATIVE
Protein, ur: 100 mg/dL — AB
RBC / HPF: >50 RBC/hpf — ABNORMAL HIGH (ref 0–5)
Specific Gravity, Urine: 1.013 (ref 1.005–1.030)
Squamous Epithelial / HPF: NONE SEEN (ref 0–5)
WBC, UA: >50 WBC/hpf — ABNORMAL HIGH (ref 0–5)
pH: 5 (ref 5.0–8.0)

## 2022-02-20 MED ORDER — SODIUM CHLORIDE 0.9 % IV SOLN
2.0000 g | Freq: Once | INTRAVENOUS | Status: AC
  Administered 2022-02-20: 2 g via INTRAVENOUS
  Filled 2022-02-20: qty 2

## 2022-02-20 MED ORDER — FOSFOMYCIN TROMETHAMINE 3 G PO PACK: 3.0000 g | PACK | Freq: Once | ORAL | 0 refills | Status: AC

## 2022-02-20 MED ORDER — FOSFOMYCIN TROMETHAMINE 3 G PO PACK: 3.0000 g | PACK | Freq: Once | ORAL | Status: DC

## 2022-02-20 MED ORDER — SODIUM CHLORIDE 0.9 % IV BOLUS
1000.0000 mL | Freq: Once | INTRAVENOUS | Status: AC
Start: 2022-02-20 — End: 2022-02-20
  Administered 2022-02-20: 1000 mL via INTRAVENOUS

## 2022-02-20 MED ORDER — FOSFOMYCIN TROMETHAMINE 3 G PO PACK
3.0000 g | PACK | Freq: Once | ORAL | Status: AC
  Administered 2022-02-20: 3 g via ORAL
  Filled 2022-02-20: qty 3

## 2022-02-20 MED ORDER — SODIUM CHLORIDE 0.9 % IV SOLN: 2.0000 g | INTRAVENOUS | Status: DC

## 2022-02-20 NOTE — Consult Note (Signed)
?Consult note ? ? ?Heather Lopez:[id number] DOB: [birth year] DOA: 02/20/2022 ? ?PCP: [name], PA-C  ?Patient coming from: home ? ? ?Chief Complaint: dysuria ? ?HPI: Heather Lopez is a 76 y.o. female with medical history significant for dm, obesity, ckd4, a flutter, htn, recent extended hospital course, who presents with the above. ? ?Last year spent several months in the hospital. Inciting problem was perforated duodenal ulcer which was repaired. Developed urinary retention and obstructive nephrolithiasis complicated by bacterial and fungal uti with perinephric abscess. Also severe sacral decubitus ulcer. All these complications led to mechanical ventilation and temporary dialysis. Released to LTAC, has since been discharged home. Uses wheelchair to get around. Ulcer followed by wound care, it is healing well. ? ?Has had dysuria, frequency, and cloudy urine for a couple of weeks. No fever or abd pain or flank pain or nausea/vomiting. Initial culture was multiple flora. Treated initially with keflex, no response. Saw provider on 3/25 who switched abx to cipro. Culture result returned today (pseudomonas indeterminate for quinolones) and patient was referred to our ED. She says symptoms have not improved with cipro ? ?ED Course:  ? ?1 L NS, labs, cefepime ? ?Review of Systems: As per HPI otherwise 10 point review of systems negative.  ? ? ?Past Medical History:  ?Diagnosis Date  ? Anxiety   ? Cataract   ? Depression   ? Diabetes mellitus without complication ([place])   ? Gout   ? History of kidney stones   ? Hyperlipidemia   ? Hypertension   ? Vertigo   ? ? ?Past Surgical History:  ?Procedure Laterality Date  ? CATARACT EXTRACTION, BILATERAL Bilateral 2019  ? COLONOSCOPY WITH PROPOFOL N/A 08/19/2015  ? Procedure: COLONOSCOPY WITH PROPOFOL;  Surgeon: Lucilla Lame, MD;  Location: [place];  Service: Endoscopy;  Laterality: N/A;  diabetic - insulin  ? CYSTOSCOPY W/ RETROGRADES Bilateral 12/22/2019  ? Procedure:  CYSTOSCOPY WITH RETROGRADE PYELOGRAM;  Surgeon: Abbie Sons, MD;  Location: [facility];  Service: Urology;  Laterality: Bilateral;  ? CYSTOSCOPY W/ URETERAL STENT PLACEMENT Bilateral 07/05/2021  ? Procedure: CYSTOSCOPY WITH RETROGRADE PYELOGRAM/BILATERAL URETERAL STENT PLACEMENT;  Surgeon: Abbie Sons, MD;  Location: [facility];  Service: Urology;  Laterality: Bilateral;  ? CYSTOSCOPY W/ URETERAL STENT PLACEMENT Bilateral 12/26/2021  ? Procedure: CYSTOSCOPY WITH STENT REMOVAL;  Surgeon: Abbie Sons, MD;  Location: [facility];  Service: Urology;  Laterality: Bilateral;  ? CYSTOSCOPY WITH BIOPSY N/A 12/22/2019  ? Procedure: CYSTOSCOPY WITH bladder BIOPSY;  Surgeon: Abbie Sons, MD;  Location: [facility];  Service: Urology;  Laterality: N/A;  ? CYSTOSCOPY WITH STENT PLACEMENT Right 12/22/2019  ? Procedure: CYSTOSCOPY WITH STENT PLACEMENT;  Surgeon: Abbie Sons, MD;  Location: [facility];  Service: Urology;  Laterality: Right;  ? CYSTOSCOPY WITH URETEROSCOPY Bilateral 12/22/2019  ? Procedure: CYSTOSCOPY WITH URETEROSCOPY;  Surgeon: Abbie Sons, MD;  Location: [facility];  Service: Urology;  Laterality: Bilateral;  ? CYSTOSCOPY/URETEROSCOPY/HOLMIUM LASER/STENT PLACEMENT Right 12/22/2019  ? Procedure: CYSTOSCOPY/URETEROSCOPY/HOLMIUM LASER/STENT PLACEMENT;  Surgeon: Abbie Sons, MD;  Location: [facility];  Service: Urology;  Laterality: Right;  ? CYSTOSCOPY/URETEROSCOPY/HOLMIUM LASER/STENT PLACEMENT Bilateral 12/08/2021  ? Procedure: CYSTOSCOPY/URETEROSCOPY/HOLMIUM LASER/STENT PLACEMENT;  Surgeon: [name], MD;  Location: [facility];  Service: Urology;  Laterality: Bilateral;  ? DIALYSIS/PERMA CATHETER INSERTION N/A 06/29/2021  ? Procedure: DIALYSIS/PERMA CATHETER INSERTION;  Surgeon: Algernon Huxley, MD;  Location: [place] [facility];  Service: Cardiovascular;  Laterality: N/A;  ? DIALYSIS/PERMA CATHETER  INSERTION N/A 07/13/2021  ? Procedure: DIALYSIS/PERMA CATHETER INSERTION;  Surgeon: Algernon Huxley, MD;   Location: [place] [facility];  Service: Cardiovascular;  Laterality: N/A;  ? EYE SURGERY    ? IR GASTROSTOMY TUBE MOD SED  08/01/2021  ? LAPAROTOMY N/A 06/29/2021  ? Procedure: EXPLORATORY LAPAROTOMY WITH REPAIR OF DUODENAL PERFORATION;  Surgeon: [name], MD;  Location: [facility];  Service: General;  Laterality: N/A;  ? POLYPECTOMY  08/19/2015  ? Procedure: POLYPECTOMY;  Surgeon: Lucilla Lame, MD;  Location: [place];  Service: Endoscopy;;  ? [place]  ? ? ? reports that she quit smoking about 50 years ago. Her smoking use included cigarettes. She has a 4.50 pack-year smoking history. She has never used smokeless tobacco. She reports that she does not drink alcohol and does not use drugs. ? ?Allergies  ?Allergen Reactions  ? Contrast Media  [Iodinated Contrast Media] Anaphylaxis  ? Iodine Swelling  ?  (IV only) - angioedema  ? ? ?Family History  ?Problem Relation Age of Onset  ? Diabetes Father   ? Cancer Father   ?     lung cancer  ? Diabetes Brother   ? Stroke Mother   ? ? ?Prior to Admission medications   ?Medication Sig Start Date End Date Taking? Authorizing Provider  ?Ascorbic Acid (VITAMIN C) 100 MG tablet Take 100 mg by mouth daily.   Yes [provider]  ?aspirin 81 MG EC tablet Take by mouth.   Yes [provider]  ?buPROPion (WELLBUTRIN) 100 MG tablet Take 100 mg by mouth daily.   Yes [provider]  ?busPIRone (BUSPAR) 5 MG tablet Place 5 mg into feeding tube daily.   Yes [provider]  ?cloNIDine (CATAPRES) 0.1 MG tablet Take 1 tablet (0.1 mg total) by mouth 2 (two) times daily. 12/11/21  Yes Fritzi Mandes, MD  ?Dulaglutide (TRULICITY) 0.53 ZJ/6.7HA SOPN Inject into the skin.   Yes [provider]  ?insulin aspart (NOVOLOG) 100 UNIT/ML injection Inject 0-10 Units into the skin 3 (three) times daily before meals.   Yes [provider]  ?insulin detemir (LEVEMIR) 100 UNIT/ML injection Inject 0.3 mLs (30 Units total) into the  skin 2 (two) times daily. 08/03/21  Yes Sharen Hones, MD  ?iron polysaccharides (NIFEREX) 150 MG capsule Take 1 capsule (150 mg total) by mouth daily. 12/11/21  Yes Fritzi Mandes, MD  ?metoprolol tartrate (LOPRESSOR) 100 MG tablet Take 1 tablet (100 mg total) by mouth 2 (two) times daily. 12/11/21  Yes Fritzi Mandes, MD  ?Multiple Vitamin (MULTIVITAMIN WITH MINERALS) TABS tablet Take 1 tablet by mouth daily. 12/12/21  Yes Fritzi Mandes, MD  ?sitaGLIPtin (JANUVIA) 25 MG tablet Place 1 tablet (25 mg total) into feeding tube daily. ?Patient taking differently: Take 25 mg by mouth daily. 12/11/21  Yes Fritzi Mandes, MD  ?cephALEXin (KEFLEX) 500 MG capsule Take 1 capsule (500 mg total) by mouth 2 (two) times daily. ?Patient not taking: Reported on 02/20/2022 02/10/22   Scot Jun, FNP  ?fluconazole (DIFLUCAN) 150 MG tablet Take 1 tablet (150 mg total) by mouth daily. ?Patient not taking: Reported on 02/20/2022 02/10/22   Scot Jun, FNP  ?fluticasone (FLONASE) 50 MCG/ACT nasal spray Place 1 spray into both nostrils daily.    [provider]  ?loperamide (IMODIUM A-D) 2 MG tablet Take 2 mg by mouth every 6 (six) hours as needed for diarrhea or loose stools.    [provider]  ?polyethylene glycol (  MIRALAX / GLYCOLAX) 17 g packet Take 17 g by mouth daily.    [provider]  ?senna-docusate (SENOKOT-S) 8.6-50 MG tablet Take 1 tablet by mouth 2 (two) times daily.    [provider]  ? ? ?Physical Exam: ?Vitals:  ? 02/20/22 1210  ?BP: 116/70  ?Pulse: 69  ?Resp: 20  ?Temp: 98.9 ?F (37.2 ?C)  ?TempSrc: Oral  ?SpO2: 100%  ? ? ?Constitutional: No acute distress ?Head: Atraumatic ?Eyes: Conjunctiva clear ?ENM: Moist mucous membranes. Normal dentition.  ?Neck: Supple ?Respiratory: Clear to auscultation bilaterally, no wheezing/rales/rhonchi. Normal respiratory effort. No accessory muscle use. [name] ?Cardiovascular: Regular rate and rhythm. No murmurs/rubs/gallops. ?Abdomen: Non-tender, non-distended.  No masses. No rebound or guarding. Positive bowel sounds. ?Musculoskeletal: No joint deformity upper and lower extremities. Normal ROM, no contractures. Normal muscle tone.  ?Skin: small stage 3 ulcer ove

## 2022-02-20 NOTE — ED Triage Notes (Signed)
Pt was sent from PCP with dx of UTI that needs IV therapy for treatment, states she has been having urinary sx for the past 2-3 weeks. ?

## 2022-02-20 NOTE — Progress Notes (Signed)
Pharmacy Antibiotic Note ? ?Heather Lopez is a 76 y.o. female w/ PMH of CKD, DM, HTN admitted on 02/20/2022 with UTI.  Pharmacy has been consulted for cefepime dosing. Serum creatinine appears to be elevated above baseline level. ? ?Plan: start cefepime 2 grams IV every 24 hours ?Follow renal function for needed dose adjustments ?  ? ?Temp (24hrs), Avg:98.9 ?F (37.2 ?C), Min:98.9 ?F (37.2 ?C), Max:98.9 ?F (37.2 ?C) ? ?Recent Labs  ?Lab 02/14/22 ?1246 02/20/22 ?1219  ?WBC 10.0 7.5  ?CREATININE 2.24* 2.50*  ?  ?Estimated Creatinine Clearance: 20.5 mL/min (A) (by C-G formula based on SCr of 2.5 mg/dL (H)).   ? ?Allergies  ?Allergen Reactions  ? Contrast Media  [Iodinated Contrast Media] Anaphylaxis  ? Iodine Swelling  ?  (IV only) - angioedema  ? ? ?Antimicrobials this admission: ?03/28 cefepime >>  ? ?Microbiology results: ?03/28 BCx: pending ?03/28 UCx: pending  ? ?Thank you for allowing pharmacy to be a part of this patient?s care. ? ?Dallie Piles ?02/20/2022 3:59 PM ? ?

## 2022-02-20 NOTE — Discharge Instructions (Addendum)
You were given the first dose of antibitoic by mouth today. Please take one additional dose on Thursday. You should be feeling better by next week. If you develop fever, please return to the emergency department. If you continue to have urinary symptoms next week please follow-up with the ID specialist, Dr. Delaine Lame.  ?

## 2022-02-20 NOTE — ED Provider Notes (Addendum)
? ?[facility] ?Provider Note ? ? ? Event Date/Time  ? First MD Initiated Contact with Patient 02/20/22 1409   ?  (approximate) ? ? ?History  ? ?Urinary Tract Infection ? ? ?HPI ? ?Heather Lopez is a 76 y.o. female with past medical history of diabetes, hypertension hyperlipidemia who presents with dysuria.  Symptoms have been going on for 2 to 3 weeks.  She endorses significant pain with urination urgency frequency.  Initially seen on 3/18 at urgent care and was given Keflex.  Symptoms not really improved so returned again on 3/24 with UA still positive and was started on ciprofloxacin.  Urine culture from that time is growing Pseudomonas that is resistant to all p.o. options she was called by urgent care and told to come to the ED.  Patient has ongoing significant dysuria.  She does have diffuse back pain as well denies nausea vomiting fevers or chills.  P.o. ?  ? ?Past Medical History:  ?Diagnosis Date  ? Anxiety   ? Cataract   ? Depression   ? Diabetes mellitus without complication ([place])   ? Gout   ? History of kidney stones   ? Hyperlipidemia   ? Hypertension   ? Vertigo   ? ? ?Patient Active Problem List  ? Diagnosis Date Noted  ? Pseudomonas urinary tract infection 02/20/2022  ? Decubitus ulcer of sacral region, stage 4 ([place]) 12/19/2021  ? Lactic acidosis 12/19/2021  ? Status post placement of ureteral stent 12/09/2021 12/19/2021  ? AKI (acute kidney injury) ([place]) 12/19/2021  ? Sacral decubitus ulcer, stage IV ([place]) 12/13/2021  ? UTI (urinary tract infection) 12/02/2021  ? Chronic osteomyelitis of coccyx ([place]) 12/02/2021  ? Abdominal pain 12/02/2021  ? Nausea & vomiting 12/02/2021  ? Hyperkalemia 12/02/2021  ? Hyponatremia 12/02/2021  ? Hyperglycemia due to diabetes mellitus ([place]) 12/02/2021  ? Elevated alkaline phosphatase level 12/02/2021  ? Elevated lipase 12/02/2021  ? Hypoalbuminemia due to protein-calorie malnutrition ([place]) 12/02/2021  ? Obesity (BMI 30-39.9) 12/02/2021  ? Anxiety  10/10/2021  ? Primary localized osteoarthritis of pelvic region and thigh 10/10/2021  ? Benign hypertensive kidney disease with chronic kidney disease 09/26/2021  ? Secondary hyperparathyroidism of renal origin ([place]) 09/26/2021  ? Hypokalemia 07/20/2021  ? Renal abscess   ? Essential hypertension   ? Lethargy   ? Duodenal ulcer perforation ([place])   ? Demand ischemia of myocardium ([place])   ? Atrial flutter with rapid ventricular response ([place]) 07/07/2021  ? Endotracheally intubated 07/07/2021  ? Bilateral nephrolithiasis 07/07/2021  ? Acute on chronic anemia 07/07/2021  ? Pressure injury of skin 06/30/2021  ? Pneumoperitoneum   ? Acute respiratory failure with hypoxia ([place]) 06/27/2021  ? Severe sepsis with septic shock ([place]) 06/27/2021  ? Acute lower UTI 06/27/2021  ? Acute kidney injury superimposed on CKD ([place]) 06/27/2021  ? Diabetes mellitus, type II ([place]) 03/03/2018  ? Vitamin D deficiency 08/21/2016  ? IBS (irritable bowel syndrome) 08/21/2016  ? Special screening for malignant neoplasms, colon   ? Benign neoplasm of transverse colon   ? Benign neoplasm of sigmoid colon   ? T2DM (type 2 diabetes mellitus) ([place]) 07/12/2015  ? Benign hypertension with CKD (chronic kidney disease) stage III ([place]) 07/12/2015  ? Hyperlipidemia associated with type 2 diabetes mellitus ([place]) 07/12/2015  ? Depression 07/12/2015  ? BMI 40.0-44.9, adult ([place]) 07/12/2015  ? ? ? ?Physical Exam  ?Triage Vital Signs: ?ED Triage Vitals [02/20/22 1210]  ?Enc Vitals Group  ?  BP 116/70  ?   Pulse Rate 69  ?   Resp 20  ?   Temp 98.9 ?F (37.2 ?C)  ?   Temp Source Oral  ?   SpO2 100 %  ?   Weight   ?   Height   ?   Head Circumference   ?   Peak Flow   ?   Pain Score   ?   Pain Loc   ?   Pain Edu?   ?   Excl. in [place]?   ? ? ?Most recent vital signs: ?Vitals:  ? 02/20/22 1210 02/20/22 1600  ?BP: 116/70 (!) 145/67  ?Pulse: 69 72  ?Resp: 20 14  ?Temp: 98.9 ?F (37.2 ?C)   ?SpO2: 100% 100%  ? ? ? ?General: Awake, no distress.  ?CV:  Good peripheral  perfusion.  ?Resp:  Normal effort.  ?Abd:  No distention.  No tenderness in the suprapubic region ?Neuro:             Awake, Alert, Oriented x 3  ?Other:  No CVA ttp ? ? ?ED Results / Procedures / Treatments  ?Labs ?(all labs ordered are listed, but only abnormal results are displayed) ?Labs Reviewed  ?COMPREHENSIVE METABOLIC PANEL - Abnormal; Notable for the following components:  ?    Result Value  ? Potassium 5.2 (*)   ? Glucose, Bld 128 (*)   ? BUN 51 (*)   ? Creatinine, Ser 2.50 (*)   ? Alkaline Phosphatase 131 (*)   ? GFR, Estimated 20 (*)   ? All other components within normal limits  ?CBC WITH DIFFERENTIAL/PLATELET - Abnormal; Notable for the following components:  ? Hemoglobin 10.5 (*)   ? HCT 34.1 (*)   ? RDW 17.6 (*)   ? Platelets 435 (*)   ? All other components within normal limits  ?URINALYSIS, ROUTINE W REFLEX MICROSCOPIC - Abnormal; Notable for the following components:  ? Color, Urine YELLOW (*)   ? APPearance TURBID (*)   ? Hgb urine dipstick MODERATE (*)   ? Protein, ur 100 (*)   ? Leukocytes,Ua MODERATE (*)   ? RBC / HPF >50 (*)   ? WBC, UA >50 (*)   ? All other components within normal limits  ?URINE CULTURE  ? ? ? ?EKG ? ? ? ? ?RADIOLOGY ? ? ? ?PROCEDURES: ? ?Critical Care performed: No ? ?Procedures ? ? ? ? ?MEDICATIONS ORDERED IN ED: ?Medications  ?ceFEPIme (MAXIPIME) 2 g in sodium chloride 0.9 % 100 mL IVPB (has no administration in time range)  ?ceFEPIme (MAXIPIME) 2 g in sodium chloride 0.9 % 100 mL IVPB (0 g Intravenous Stopped 02/20/22 1552)  ?sodium chloride 0.9 % bolus 1,000 mL (0 mLs Intravenous Stopped 02/20/22 1610)  ?fosfomycin (MONUROL) packet 3 g (3 g Oral Given 02/20/22 1609)  ? ? ? ?IMPRESSION / MDM / ASSESSMENT AND PLAN / ED COURSE  ?I reviewed the triage vital signs and the nursing notes. ?             ?               ? ?Differential diagnosis includes, but is not limited to, UTI with resistant Pseudomonas ? ?Patient is a 76 year old female presenting with ongoing dysuria.  Has  been seen several times over the last several weeks.  Has been on both Keflex and Cipro with no resolution of symptoms.  UA from 4 days ago still with significant pyuria.  No fevers  or other systemic symptoms.  Patient's urine culture which I reviewed from 3/24 from urgent care is growing resistant Pseudomonas and fortunately is only in intermediately sensitive to fluoroquinolones so she will need admission for IV antibiotics.  No leukocytosis to suggest sepsis.  Vital signs are also within normal limits.  Creatinine is elevated around 2.5 but this is not far from her baseline.  We will give a liter of fluid and cefepime.  Patient has not yet urinated here but I did confirm with RN that she will give a urine specimen prior to obtaining the first dose of cefepime.  Discussed with hospitalist for admission. ? ?After patient initially admitted, Dr. Delaine Lame with ID recommend fosfomycin as long as pts symptoms are of lower UTI. Patient has no flank pain, fever, or leukocytosis, I do think that this is a lower UTI so agree with fosfomycin. Will give a dose in the ED and a second dose on day 3. Pt given clear return precautions to return to ED for fever or constitutional symptoms, and to f/u with ID if next week still having lower UTI symptoms. Pt is agreeable with the plan.  ?  ? ? ?FINAL CLINICAL IMPRESSION(S) / ED DIAGNOSES  ? ?Final diagnoses:  ?Urinary tract infection without hematuria, site unspecified  ? ? ? ?Rx / DC Orders  ? ?ED Discharge Orders   ? ?      Ordered  ?  fosfomycin (MONUROL) 3 g PACK   Once       ? 02/20/22 1608  ? ?  ?  ? ?  ? ? ? ?Note:  This document was prepared using Dragon voice recognition software and may include unintentional dictation errors. ?  ?Rada Hay, MD ?02/20/22 1434 ? ?  ?Rada Hay, MD ?02/20/22 2003 ? ?

## 2022-02-20 NOTE — H&P (Deleted)
?Consult note ? ? ?DICY SMIGEL IPJ:[id number] DOB: 1946-06-03 DOA: 02/20/2022 ? ?PCP: [name], PA-C  ?Patient coming from: home ? ? ?Chief Complaint: dysuria ? ?HPI: Heather Lopez is a 76 y.o. female with medical history significant for dm, obesity, ckd4, a flutter, htn, recent extended hospital course, who presents with the above. ? ?Last year spent several months in the hospital. Inciting problem was perforated duodenal ulcer which was repaired. Developed urinary retention and obstructive nephrolithiasis complicated by bacterial and fungal uti with perinephric abscess. Also severe sacral decubitus ulcer. All these complications led to mechanical ventilation and temporary dialysis. Released to LTAC, has since been discharged home. Uses wheelchair to get around. Ulcer followed by wound care, it is healing well. ? ?Has had dysuria, frequency, and cloudy urine for a couple of weeks. No fever or abd pain or flank pain or nausea/vomiting. Initial culture was multiple flora. Treated initially with keflex, no response. Saw provider on 3/25 who switched abx to cipro. Culture result returned today (pseudomonas indeterminate for quinolones) and patient was referred to our ED. She says symptoms have not improved with cipro ? ?ED Course:  ? ?1 L NS, labs, cefepime ? ?Review of Systems: As per HPI otherwise 10 point review of systems negative.  ? ? ?Past Medical History:  ?Diagnosis Date  ? Anxiety   ? Cataract   ? Depression   ? Diabetes mellitus without complication ([place])   ? Gout   ? History of kidney stones   ? Hyperlipidemia   ? Hypertension   ? Vertigo   ? ? ?Past Surgical History:  ?Procedure Laterality Date  ? CATARACT EXTRACTION, BILATERAL Bilateral 2019  ? COLONOSCOPY WITH PROPOFOL N/A 08/19/2015  ? Procedure: COLONOSCOPY WITH PROPOFOL;  Surgeon: Lucilla Lame, MD;  Location: [place];  Service: Endoscopy;  Laterality: N/A;  diabetic - insulin  ? CYSTOSCOPY W/ RETROGRADES Bilateral 12/22/2019  ? Procedure:  CYSTOSCOPY WITH RETROGRADE PYELOGRAM;  Surgeon: Abbie Sons, MD;  Location: [facility];  Service: Urology;  Laterality: Bilateral;  ? CYSTOSCOPY W/ URETERAL STENT PLACEMENT Bilateral 07/05/2021  ? Procedure: CYSTOSCOPY WITH RETROGRADE PYELOGRAM/BILATERAL URETERAL STENT PLACEMENT;  Surgeon: Abbie Sons, MD;  Location: [facility];  Service: Urology;  Laterality: Bilateral;  ? CYSTOSCOPY W/ URETERAL STENT PLACEMENT Bilateral 12/26/2021  ? Procedure: CYSTOSCOPY WITH STENT REMOVAL;  Surgeon: Abbie Sons, MD;  Location: [facility];  Service: Urology;  Laterality: Bilateral;  ? CYSTOSCOPY WITH BIOPSY N/A 12/22/2019  ? Procedure: CYSTOSCOPY WITH bladder BIOPSY;  Surgeon: Abbie Sons, MD;  Location: [facility];  Service: Urology;  Laterality: N/A;  ? CYSTOSCOPY WITH STENT PLACEMENT Right 12/22/2019  ? Procedure: CYSTOSCOPY WITH STENT PLACEMENT;  Surgeon: Abbie Sons, MD;  Location: [facility];  Service: Urology;  Laterality: Right;  ? CYSTOSCOPY WITH URETEROSCOPY Bilateral 12/22/2019  ? Procedure: CYSTOSCOPY WITH URETEROSCOPY;  Surgeon: Abbie Sons, MD;  Location: [facility];  Service: Urology;  Laterality: Bilateral;  ? CYSTOSCOPY/URETEROSCOPY/HOLMIUM LASER/STENT PLACEMENT Right 12/22/2019  ? Procedure: CYSTOSCOPY/URETEROSCOPY/HOLMIUM LASER/STENT PLACEMENT;  Surgeon: Abbie Sons, MD;  Location: [facility];  Service: Urology;  Laterality: Right;  ? CYSTOSCOPY/URETEROSCOPY/HOLMIUM LASER/STENT PLACEMENT Bilateral 12/08/2021  ? Procedure: CYSTOSCOPY/URETEROSCOPY/HOLMIUM LASER/STENT PLACEMENT;  Surgeon: [name], MD;  Location: [facility];  Service: Urology;  Laterality: Bilateral;  ? DIALYSIS/PERMA CATHETER INSERTION N/A 06/29/2021  ? Procedure: DIALYSIS/PERMA CATHETER INSERTION;  Surgeon: Algernon Huxley, MD;  Location: [place] [facility];  Service: Cardiovascular;  Laterality: N/A;  ? DIALYSIS/PERMA CATHETER  INSERTION N/A 07/13/2021  ? Procedure: DIALYSIS/PERMA CATHETER INSERTION;  Surgeon: Algernon Huxley, MD;   Location: [place] [facility];  Service: Cardiovascular;  Laterality: N/A;  ? EYE SURGERY    ? IR GASTROSTOMY TUBE MOD SED  08/01/2021  ? LAPAROTOMY N/A 06/29/2021  ? Procedure: EXPLORATORY LAPAROTOMY WITH REPAIR OF DUODENAL PERFORATION;  Surgeon: [name], MD;  Location: [facility];  Service: General;  Laterality: N/A;  ? POLYPECTOMY  08/19/2015  ? Procedure: POLYPECTOMY;  Surgeon: Lucilla Lame, MD;  Location: [place];  Service: Endoscopy;;  ? [place]  ? ? ? reports that she quit smoking about 50 years ago. Her smoking use included cigarettes. She has a 4.50 pack-year smoking history. She has never used smokeless tobacco. She reports that she does not drink alcohol and does not use drugs. ? ?Allergies  ?Allergen Reactions  ? Contrast Media  [Iodinated Contrast Media] Anaphylaxis  ? Iodine Swelling  ?  (IV only) - angioedema  ? ? ?Family History  ?Problem Relation Age of Onset  ? Diabetes Father   ? Cancer Father   ?     lung cancer  ? Diabetes Brother   ? Stroke Mother   ? ? ?Prior to Admission medications   ?Medication Sig Start Date End Date Taking? Authorizing Provider  ?Ascorbic Acid (VITAMIN C) 100 MG tablet Take 100 mg by mouth daily.   Yes [provider]  ?aspirin 81 MG EC tablet Take by mouth.   Yes [provider]  ?buPROPion (WELLBUTRIN) 100 MG tablet Take 100 mg by mouth daily.   Yes [provider]  ?busPIRone (BUSPAR) 5 MG tablet Place 5 mg into feeding tube daily.   Yes [provider]  ?cloNIDine (CATAPRES) 0.1 MG tablet Take 1 tablet (0.1 mg total) by mouth 2 (two) times daily. 12/11/21  Yes Fritzi Mandes, MD  ?Dulaglutide (TRULICITY) 5.17 GY/1.7CB SOPN Inject into the skin.   Yes [provider]  ?insulin aspart (NOVOLOG) 100 UNIT/ML injection Inject 0-10 Units into the skin 3 (three) times daily before meals.   Yes [provider]  ?insulin detemir (LEVEMIR) 100 UNIT/ML injection Inject 0.3 mLs (30 Units total) into the  skin 2 (two) times daily. 08/03/21  Yes Sharen Hones, MD  ?iron polysaccharides (NIFEREX) 150 MG capsule Take 1 capsule (150 mg total) by mouth daily. 12/11/21  Yes Fritzi Mandes, MD  ?metoprolol tartrate (LOPRESSOR) 100 MG tablet Take 1 tablet (100 mg total) by mouth 2 (two) times daily. 12/11/21  Yes Fritzi Mandes, MD  ?Multiple Vitamin (MULTIVITAMIN WITH MINERALS) TABS tablet Take 1 tablet by mouth daily. 12/12/21  Yes Fritzi Mandes, MD  ?sitaGLIPtin (JANUVIA) 25 MG tablet Place 1 tablet (25 mg total) into feeding tube daily. ?Patient taking differently: Take 25 mg by mouth daily. 12/11/21  Yes Fritzi Mandes, MD  ?cephALEXin (KEFLEX) 500 MG capsule Take 1 capsule (500 mg total) by mouth 2 (two) times daily. ?Patient not taking: Reported on 02/20/2022 02/10/22   Scot Jun, FNP  ?fluconazole (DIFLUCAN) 150 MG tablet Take 1 tablet (150 mg total) by mouth daily. ?Patient not taking: Reported on 02/20/2022 02/10/22   Scot Jun, FNP  ?fluticasone (FLONASE) 50 MCG/ACT nasal spray Place 1 spray into both nostrils daily.    [provider]  ?loperamide (IMODIUM A-D) 2 MG tablet Take 2 mg by mouth every 6 (six) hours as needed for diarrhea or loose stools.    [provider]  ?polyethylene glycol (  MIRALAX / GLYCOLAX) 17 g packet Take 17 g by mouth daily.    [provider]  ?senna-docusate (SENOKOT-S) 8.6-50 MG tablet Take 1 tablet by mouth 2 (two) times daily.    [provider]  ? ? ?Physical Exam: ?Vitals:  ? 02/20/22 1210  ?BP: 116/70  ?Pulse: 69  ?Resp: 20  ?Temp: 98.9 ?F (37.2 ?C)  ?TempSrc: Oral  ?SpO2: 100%  ? ? ?Constitutional: No acute distress ?Head: Atraumatic ?Eyes: Conjunctiva clear ?ENM: Moist mucous membranes. Normal dentition.  ?Neck: Supple ?Respiratory: Clear to auscultation bilaterally, no wheezing/rales/rhonchi. Normal respiratory effort. No accessory muscle use. [name] ?Cardiovascular: Regular rate and rhythm. No murmurs/rubs/gallops. ?Abdomen: Non-tender, non-distended.  No masses. No rebound or guarding. Positive bowel sounds. ?Musculoskeletal: No joint deformity upper and lower extremities. Normal ROM, no contractures. Normal muscle tone.  ?Skin: small stage 3 ulcer ove

## 2022-02-21 ENCOUNTER — Ambulatory Visit: Payer: Medicare Other | Admitting: Urology

## 2022-02-22 LAB — URINE CULTURE: Culture: <10000 — AB

## 2022-02-27 ENCOUNTER — Encounter: Payer: Medicare Other | Attending: Physician Assistant | Admitting: Physician Assistant

## 2022-02-27 DIAGNOSIS — I10 Essential (primary) hypertension: Secondary | ICD-10-CM | POA: Diagnosis not present

## 2022-02-27 DIAGNOSIS — E11622 Type 2 diabetes mellitus with other skin ulcer: Secondary | ICD-10-CM | POA: Insufficient documentation

## 2022-02-27 DIAGNOSIS — E669 Obesity, unspecified: Secondary | ICD-10-CM | POA: Diagnosis not present

## 2022-02-27 DIAGNOSIS — L89154 Pressure ulcer of sacral region, stage 4: Secondary | ICD-10-CM | POA: Diagnosis not present

## 2022-02-27 DIAGNOSIS — Z6832 Body mass index (BMI) 32.0-32.9, adult: Secondary | ICD-10-CM | POA: Insufficient documentation

## 2022-02-27 DIAGNOSIS — Z09 Encounter for follow-up examination after completed treatment for conditions other than malignant neoplasm: Secondary | ICD-10-CM | POA: Diagnosis not present

## 2022-02-27 NOTE — Progress Notes (Addendum)
Ruggles, Cashlynn E. ([id number]) ?Visit Report for 02/27/2022 ?Chief Complaint Document Details ?Patient Name: Heather Lopez, Heather E. ?Date of Service: 02/27/2022 10:30 AM ?Medical Record Number: [id number] ?Patient Account Number: [id number] ?Date of Birth/Sex: 12/21/1945 (76 y.o. F) ?Treating RN: Levora Dredge ?Primary Care Provider: [name] Other Clinician: ?Referring Provider: [name] ?Treating Provider/Extender: Jeri Cos ?Weeks in Treatment: 6 ?Information Obtained from: Patient ?Chief Complaint ?Sacral pressure ulcer ?Electronic Signature(s) ?Signed: 02/27/2022 10:30:09 AM By: Worthy Keeler PA-C ?Entered By: Worthy Keeler on 02/27/2022 10:30:08 ?Geisel, [place] ([id number]) ?-------------------------------------------------------------------------------- ?HPI Details ?Patient Name: Heather Lopez, Heather E. ?Date of Service: 02/27/2022 10:30 AM ?Medical Record Number: [id number] ?Patient Account Number: [id number] ?Date of Birth/Sex: 02-19-1946 (76 y.o. F) ?Treating RN: Levora Dredge ?Primary Care Provider: [name] Other Clinician: ?Referring Provider: [name] ?Treating Provider/Extender: Jeri Cos ?Weeks in Treatment: 6 ?History of Present Illness ?HPI Description: 01/16/2022 upon evaluation patient appears to be doing somewhat poorly in regard to her wound in the sacral region which has ?been present for about 6 months after she was unfortunately in the ICU for period of time. Subsequently the patient actually ended up having a ?fall this was in July 2022 and her health has been declining slowly since that time. Overall she was also in the hospital for an extended stay in ?January where she was diagnosed as having osteomyelitis she was treated for this in the skilled nursing facility, [facility]. Apparently that ?treatment is complete for the osteomyelitis of the sacral region. With that being said she is continue to have a wound VAC though based on what I ?am seeing I do not believe this wound VAC has been  appropriately packed into the depth of the wound this is quite deep considering how small it ?is. ?Patient does have a history of diabetes mellitus type 2, hypertension, and generalized muscle weakness. She also needs her right hip replaced but ?they will not do this until the wound is healed. ?01/25/2022 upon evaluation today patient appears to be doing pretty well currently in regard to the sacral wound. This actually does not appear to ?be as open as it was previous. It still does have a bit of depth here and again that something that was still good to be managing but as far as the ?overall size of the wound from a opening standpoint this is much better than last week is also less friable than when I saw her last. Fortunately I ?do not see any evidence of active infection locally or systemically which is great news. ?02/12/2022 upon evaluation today patient appears to actually be doing quite well in regard to her wound. She has been tolerating the dressing ?changes without complication. Fortunately there does not appear to be any signs of active infection locally nor systemically at this time which is ?great news. No fevers, chills, nausea, vomiting, or diarrhea. ?02-27-2022 upon evaluation today patient appears to be doing well currently in regard to her wound. She has been tolerating the dressing changes ?without complication. Fortunately I do not see any signs of active infection locally nor systemically which is great news. No fevers, chills, nausea, ?vomiting, or diarrhea. ?Electronic Signature(s) ?Signed: 02/27/2022 10:52:09 AM By: Worthy Keeler PA-C ?Entered By: Worthy Keeler on 02/27/2022 10:52:08 ?Soledad, Tiye E. ([id number]) ?-------------------------------------------------------------------------------- ?Physical Exam Details ?Patient Name: Heather Lopez, Heather E. ?Date of Service: 02/27/2022 10:30 AM ?Medical Record Number: [id number] ?Patient Account Number: [id number] ?Date of Birth/Sex: 1946-09-23 (76 y.o. F) ?Treating  RN: Levora Dredge ?Primary Care Provider:  [name] Other Clinician: ?Referring Provider: [name] ?Treating Provider/Extender: Jeri Cos ?Weeks in Treatment: 6 ?Constitutional ?Obese and well-hydrated in no acute distress. ?Respiratory ?normal breathing without difficulty. ?Psychiatric ?this patient is able to make decisions and demonstrates good insight into disease process. Alert and Oriented x 3. pleasant and cooperative. ?Notes ?Upon further investigation it appears that the patient's wound is completely healed. She does have a divot here in the sacral area but again there ?is absolutely no drainage that I can find and to be perfectly honest I really feel like that she is healed. Obviously it may have some remodeling of ?this home a feeling at some point but nonetheless it appears that the new skin just grow down the hall and sealed up underneath there is really ?nothing to do for this but just leave it be at this point in my opinion. ?Electronic Signature(s) ?Signed: 02/27/2022 10:52:42 AM By: Worthy Keeler PA-C ?Entered By: Worthy Keeler on 02/27/2022 10:52:42 ?Heather Lopez, Heather E. ([id number]) ?-------------------------------------------------------------------------------- ?Physician Orders Details ?Patient Name: Heather Lopez, Heather E. ?Date of Service: 02/27/2022 10:30 AM ?Medical Record Number: [id number] ?Patient Account Number: [id number] ?Date of Birth/Sex: 1946-05-01 (75 y.o. F) ?Treating RN: Levora Dredge ?Primary Care Provider: [name] Other Clinician: ?Referring Provider: [name] ?Treating Provider/Extender: Jeri Cos ?Weeks in Treatment: 6 ?Verbal / Phone Orders: No ?Diagnosis Coding ?ICD-10 Coding ?Code Description ?L89.154 Pressure ulcer of sacral region, stage 4 ?E11.622 Type 2 diabetes mellitus with other skin ulcer ?I10 Essential (primary) hypertension ?M62.81 Muscle weakness (generalized) ?Discharge From [facility] ?o Discharge from [facility] Complete  - From wound care clinic stand point, sacrum wound is healed and patient ?has no restriction from Orthopedic surgery standpoint. Per PA Stone Please call clinic with any issues with healed wound. ?Electronic Signature(s) ?Signed: 02/27/2022 3:38:41 PM By: Levora Dredge ?Signed: 03/02/2022 5:15:24 PM By: Worthy Keeler PA-C ?Entered By: Levora Dredge on 02/27/2022 10:53:26 ?Heather Lopez, Heather E. ([id number]) ?-------------------------------------------------------------------------------- ?Problem List Details ?Patient Name: Heather Lopez, Heather E. ?Date of Service: 02/27/2022 10:30 AM ?Medical Record Number: [id number] ?Patient Account Number: [id number] ?Date of Birth/Sex: 03-15-1946 (76 y.o. F) ?Treating RN: Levora Dredge ?Primary Care Provider: [name] Other Clinician: ?Referring Provider: [name] ?Treating Provider/Extender: Jeri Cos ?Weeks in Treatment: 6 ?Active Problems ?ICD-10 ?Encounter ?Code Description Active Date MDM ?Diagnosis ?L89.154 Pressure ulcer of sacral region, stage 4 01/16/2022 No Yes ?E11.622 Type 2 diabetes mellitus with other skin ulcer 01/16/2022 No Yes ?I10 Essential (primary) hypertension 01/16/2022 No Yes ?M62.81 Muscle weakness (generalized) 01/16/2022 No Yes ?Inactive Problems ?Resolved Problems ?Electronic Signature(s) ?Signed: 02/27/2022 10:30:00 AM By: Worthy Keeler PA-C ?Entered By: Worthy Keeler on 02/27/2022 10:29:59 ?Heather Lopez, Heather E. ([id number]) ?-------------------------------------------------------------------------------- ?Progress Note Details ?Patient Name: Heather Lopez, Heather E. ?Date of Service: 02/27/2022 10:30 AM ?Medical Record Number: [id number] ?Patient Account Number: [id number] ?Date of Birth/Sex: 05/31/1946 (75 y.o. F) ?Treating RN: Levora Dredge ?Primary Care Provider: [name] Other Clinician: ?Referring Provider: [name] ?Treating Provider/Extender: Jeri Cos ?Weeks in Treatment: 6 ?Subjective ?Chief Complaint ?Information obtained from Patient ?Sacral pressure  ulcer ?History of Present Illness (HPI) ?01/16/2022 upon evaluation patient appears to be doing somewhat poorly in regard to her wound in the sacral region which has been present for ?about 6 months afte

## 2022-02-27 NOTE — Progress Notes (Addendum)
Gulyas, Lam E. ([id number]) ?Visit Report for 02/27/2022 ?Arrival Information Details ?Patient Name: Heather Lopez, Heather E. ?Date of Service: 02/27/2022 10:30 AM ?Medical Record Number: [id number] ?Patient Account Number: [id number] ?Date of Birth/Sex: June 27, 1946 (77 y.o. F) ?Treating RN: Levora Dredge ?Primary Care Ilay Capshaw: [name] Other Clinician: ?Referring Francee Setzer: [name] ?Treating Hildy Nicholl/Extender: Jeri Cos ?Weeks in Treatment: 6 ?Visit Information History Since Last Visit ?Added or deleted any medications: No ?Patient Arrived: Wheel Chair ?Any new allergies or adverse reactions: No ?Arrival Time: 10:34 ?Had a fall or experienced change in No ?Accompanied By: husband ?activities of daily living that may affect ?Transfer Assistance: None ?risk of falls: ?Patient Identification Verified: Yes ?Hospitalized since last visit: No ?Secondary Verification Process Completed: Yes ?Has Dressing in Place as Prescribed: Yes ?Patient Has Alerts: Yes ?Pain Present Now: Yes ?Patient Alerts: Patient on Blood Thinner ?Type 2 diabetic ?Electronic Signature(s) ?Signed: 02/27/2022 3:38:41 PM By: Levora Dredge ?Entered By: Levora Dredge on 02/27/2022 10:35:01 ?Heather Lopez, Heather E. ([id number]) ?-------------------------------------------------------------------------------- ?Clinic Level of Care Assessment Details ?Patient Name: Heather Lopez, Heather E. ?Date of Service: 02/27/2022 10:30 AM ?Medical Record Number: [id number] ?Patient Account Number: [id number] ?Date of Birth/Sex: May 02, 1946 (76 y.o. F) ?Treating RN: Levora Dredge ?Primary Care Kyndel Egger: [name] Other Clinician: ?Referring Tirso Laws: [name] ?Treating Keiva Dina/Extender: Jeri Cos ?Weeks in Treatment: 6 ?Clinic Level of Care Assessment Items ?TOOL 4 Quantity Score ?'[]'$  - Use when only an EandM is performed on FOLLOW-UP visit 0 ?ASSESSMENTS - Nursing Assessment / Reassessment ?'[]'$  - Reassessment of Co-morbidities (includes updates in patient status) 0 ?'[]'$  -  0 ?Reassessment of Adherence to Treatment Plan ?ASSESSMENTS - Wound and Skin Assessment / Reassessment ?X - Simple Wound Assessment / Reassessment - one wound 1 5 ?'[]'$  - 0 ?Complex Wound Assessment / Reassessment - multiple wounds ?'[]'$  - 0 ?Dermatologic / Skin Assessment (not related to wound area) ?ASSESSMENTS - Focused Assessment ?'[]'$  - Circumferential Edema Measurements - multi extremities 0 ?'[]'$  - 0 ?Nutritional Assessment / Counseling / Intervention ?'[]'$  - 0 ?Lower Extremity Assessment (monofilament, tuning fork, pulses) ?'[]'$  - 0 ?Peripheral Arterial Disease Assessment (using hand held doppler) ?ASSESSMENTS - Ostomy and/or Continence Assessment and Care ?'[]'$  - Incontinence Assessment and Management 0 ?'[]'$  - 0 ?Ostomy Care Assessment and Management (repouching, etc.) ?PROCESS - Coordination of Care ?X - Simple Patient / Family Education for ongoing care 1 15 ?'[]'$  - 0 ?Complex (extensive) Patient / Family Education for ongoing care ?'[]'$  - 0 ?Staff obtains Consents, Records, Test Results / Process Orders ?'[]'$  - 0 ?Staff telephones HHA, Nursing Homes / Clarify orders / etc ?'[]'$  - 0 ?Routine Transfer to another Facility (non-emergent condition) ?'[]'$  - 0 ?Routine Hospital Admission (non-emergent condition) ?'[]'$  - 0 ?New Admissions / Biomedical engineer / Ordering NPWT, Apligraf, etc. ?'[]'$  - 0 ?Emergency Hospital Admission (emergent condition) ?X- 1 10 ?Simple Discharge Coordination ?'[]'$  - 0 ?Complex (extensive) Discharge Coordination ?PROCESS - Special Needs ?'[]'$  - Pediatric / Minor Patient Management 0 ?'[]'$  - 0 ?Isolation Patient Management ?'[]'$  - 0 ?Hearing / Language / Visual special needs ?'[]'$  - 0 ?Assessment of Community assistance (transportation, D/C planning, etc.) ?'[]'$  - 0 ?Additional assistance / Altered mentation ?'[]'$  - 0 ?Support Surface(s) Assessment (bed, cushion, seat, etc.) ?INTERVENTIONS - Wound Cleansing / Measurement ?Heather Lopez, Heather E. ([id number]) ?X- 1 5 ?Simple Wound Cleansing - one wound ?'[]'$  - 0 ?Complex Wound  Cleansing - multiple wounds ?X- 1 5 ?Wound Imaging (photographs - any number of wounds) ?'[]'$  - 0 ?Wound Tracing (instead of photographs) ?X- 1 5 ?Simple  Wound Measurement - one wound ?'[]'$  - 0 ?Complex Wound Measurement - multiple wounds ?INTERVENTIONS - Wound Dressings ?'[]'$  - Small Wound Dressing one or multiple wounds 0 ?'[]'$  - 0 ?Medium Wound Dressing one or multiple wounds ?'[]'$  - 0 ?Large Wound Dressing one or multiple wounds ?'[]'$  - 0 ?Application of Medications - topical ?'[]'$  - 0 ?Application of Medications - injection ?INTERVENTIONS - Miscellaneous ?'[]'$  - External ear exam 0 ?'[]'$  - 0 ?Specimen Collection (cultures, biopsies, blood, body fluids, etc.) ?'[]'$  - 0 ?Specimen(s) / Culture(s) sent or taken to Lab for analysis ?'[]'$  - 0 ?Patient Transfer (multiple staff / Civil Service fast streamer / Similar devices) ?'[]'$  - 0 ?Simple Staple / Suture removal (25 or less) ?'[]'$  - 0 ?Complex Staple / Suture removal (26 or more) ?'[]'$  - 0 ?Hypo / Hyperglycemic Management (close monitor of Blood Glucose) ?'[]'$  - 0 ?Ankle / Brachial Index (ABI) - do not check if billed separately ?X- 1 5 ?Vital Signs ?Has the patient been seen at the hospital within the last three years: Yes ?Total Score: 50 ?Level Of Care: New/Established - Level ?2 ?Electronic Signature(s) ?Signed: 02/27/2022 3:38:41 PM By: Levora Dredge ?Entered By: Levora Dredge on 02/27/2022 11:01:55 ?Heather Lopez, Heather E. ([id number]) ?-------------------------------------------------------------------------------- ?Encounter Discharge Information Details ?Patient Name: Heather Lopez, Heather E. ?Date of Service: 02/27/2022 10:30 AM ?Medical Record Number: [id number] ?Patient Account Number: [id number] ?Date of Birth/Sex: 1946-09-09 (76 y.o. F) ?Treating RN: Levora Dredge ?Primary Care Neytiri Asche: [name] Other Clinician: ?Referring Candee Hoon: [name] ?Treating Gerrett Loman/Extender: Jeri Cos ?Weeks in Treatment: 6 ?Encounter Discharge Information Items ?Discharge Condition: Stable ?Ambulatory Status:  Wheelchair ?Discharge Destination: Home ?Transportation: Private Auto ?Accompanied By: husband ?Schedule Follow-up Appointment: Yes ?Clinical Summary of Care: ?Electronic Signature(s) ?Signed: 02/27/2022 11:05:00 AM By: Levora Dredge ?Entered By: Levora Dredge on 02/27/2022 11:04:59 ?Heather Lopez, Heather E. ([id number]) ?-------------------------------------------------------------------------------- ?Lower Extremity Assessment Details ?Patient Name: Heather Lopez, Heather E. ?Date of Service: 02/27/2022 10:30 AM ?Medical Record Number: [id number] ?Patient Account Number: [id number] ?Date of Birth/Sex: [birth year] (76 y.o. F) ?Treating RN: Levora Dredge ?Primary Care Yaquelin Langelier: [name] Other Clinician: ?Referring Verma Grothaus: [name] ?Treating Corben Auzenne/Extender: Jeri Cos ?Weeks in Treatment: 6 ?Electronic Signature(s) ?Signed: 02/27/2022 3:38:41 PM By: Levora Dredge ?Entered By: Levora Dredge on 02/27/2022 10:44:52 ?Heather Lopez, Heather E. ([id number]) ?-------------------------------------------------------------------------------- ?Multi Wound Chart Details ?Patient Name: Heather Lopez, Heather E. ?Date of Service: 02/27/2022 10:30 AM ?Medical Record Number: [id number] ?Patient Account Number: [id number] ?Date of Birth/Sex: 1946-05-10 (76 y.o. F) ?Treating RN: Levora Dredge ?Primary Care Aireonna Bauer: [name] Other Clinician: ?Referring Audley Hinojos: [name] ?Treating Donnalyn Juran/Extender: Jeri Cos ?Weeks in Treatment: 6 ?Vital Signs ?Height(in): 63 ?Pulse(bpm): 79 ?Weight(lbs): 185 ?Blood Pressure(mmHg): 122/80 ?Body Mass Index(BMI): 32.8 ?Temperature(??F): 97.9 ?Respiratory Rate(breaths/min): 18 ?Photos: [N/A:N/A] ?Wound Location: Midline Sacrum N/A N/A ?Wounding Event: Pressure Injury N/A N/A ?Primary Etiology: Pressure Ulcer N/A N/A ?Comorbid History: Cataracts, Arrhythmia, N/A N/A ?Hypertension, Type II Diabetes, ?Osteoarthritis, Osteomyelitis ?Date Acquired: 08/10/2021 N/A N/A ?Weeks of Treatment: 6 N/A N/A ?Wound Status: Open N/A  N/A ?Wound Recurrence: No N/A N/A ?Measurements L x W x D (cm) 0.1x0.1x0.3 N/A N/A ?Area (cm?) : 0.008 N/A N/A ?Volume (cm?) : 0.002 N/A N/A ?% Reduction in Area: 98.00% N/A N/A ?% Reduction in Volume: 99.70% N/A N/A ?Classification: Ca

## 2022-03-01 ENCOUNTER — Ambulatory Visit
Admission: RE | Admit: 2022-03-01 | Discharge: 2022-03-01 | Disposition: A | Discharge status: Home / Self Care | Payer: Medicare Other | Source: Ambulatory Visit | Attending: Infectious Diseases | Admitting: Infectious Diseases

## 2022-03-01 ENCOUNTER — Encounter: Payer: Self-pay | Admitting: Infectious Diseases

## 2022-03-01 ENCOUNTER — Ambulatory Visit (INDEPENDENT_AMBULATORY_CARE_PROVIDER_SITE_OTHER): Payer: Medicare Other | Admitting: Physician Assistant

## 2022-03-01 ENCOUNTER — Ambulatory Visit: Payer: Medicare Other | Attending: Infectious Diseases | Admitting: Infectious Diseases

## 2022-03-01 VITALS — BP 134/68 | HR 78 | Temp 97.2°F | Ht 63.0 in | Wt 190.0 lb

## 2022-03-01 DIAGNOSIS — E119 Type 2 diabetes mellitus without complications: Secondary | ICD-10-CM | POA: Diagnosis not present

## 2022-03-01 DIAGNOSIS — N390 Urinary tract infection, site not specified: Secondary | ICD-10-CM | POA: Insufficient documentation

## 2022-03-01 DIAGNOSIS — R300 Dysuria: Secondary | ICD-10-CM | POA: Insufficient documentation

## 2022-03-01 DIAGNOSIS — I4892 Unspecified atrial flutter: Secondary | ICD-10-CM | POA: Insufficient documentation

## 2022-03-01 DIAGNOSIS — N200 Calculus of kidney: Secondary | ICD-10-CM | POA: Insufficient documentation

## 2022-03-01 DIAGNOSIS — R350 Frequency of micturition: Secondary | ICD-10-CM

## 2022-03-01 LAB — BLADDER SCAN AMB NON-IMAGING: Scan Result: 32

## 2022-03-01 NOTE — Progress Notes (Signed)
NAME: Heather Lopez  ?DOB: 1945/12/12  ?MRN: [id number]  ?Date/Time: 03/01/2022 11:58 AM ? ? ?Subjective:  ? ?Pt is referred from ED for UTI ?Pt has dysuria and increased frequency- recent Urine culture had pseudomonas resistant to cipro- She went to ED last week and after discussion with me we gave fosfomycin as she did not have any systemic symptoms- she improved somewhat only to have the dysuira recur after a few days- So she is here to see me . ?She has complicated medical history ? ?Heather Lopez is a 76 y.o. 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  After [facility] she went :[facility] and while being there was treated with IV vanco for 6 weeks for sacral wound- From [facility] she went to rehab and eventually hoe but stayed home only for  48hrs and came back to the ED in January 2023. ?She was in the hospital between 12/02/2021 until 12/12/2021 for abdominal pain and fever.  She was diagnosed with bilateral hydronephrosis and hydroureter right more than left in spite of double-J ureteral stents with adequate positioning.  The urinary bladder was also dilated distended due to urinary retention which was causing the bilateral hydronephrosis..  Patient had Foley placed.  Proteus was growing in the urine culture.  She was put on IV ampicillin. ?She was then seen by urologist on 12/09/2021 and underwent laser lithotripsy and stent exchange.  .  The sacral decubitus has healed- she is followed at wound clinic ?Today she has no fever, no loss of appetite, no cough, no diarrhea ?She has chronic rt hip pain and waiting to have rt hip replacement ? ? ?The following notes taken from my old note ?06/23/2021 patient had presented to urgent care with cough and body ache.  She was sent to the ED.  She was found to have acute kidney injury.  The creatinine was 9.32 from 1.97.   She also had a potassium of 5.5 at that admission.  Dialysis catheter was placed on 06/29/2021.  On the night of 06/29/2021 she developed severe abdominal pain and was diagnosed with large amount of pneumoperitoneum on CT abdomen with free fluid.  A left renal mass measuring 6.1 into 8 into 4.1 cm suspicious for renal cell carcinoma was also noted.  She will come to the ER on 06/30/2021 and underwent expiratory laparotomy and primary repair of the perforated duodenal ulcer with creation of an omental flap.  She was sent to ICU.  She remained intubated.  She was also on dialysis.  As she continued to spike fever and with leukocytosis repeat CT scan was done on 07/04/2021 and that revealed a 14 mm left UPJ stone with mild left hydronephrosis and left upper pole renal lesion.  There was also right hydroureteronephrosis.  Urine culture was yeast.  Urology saw the patient and she underwent cystoscopy with bilateral ureteral stent placement.  Left pyelonephrosis was seen and culture was sent from left renal pelvis urine and it showed 70,000 colonies of Candida glabrata.  The right kidney urine was also sent and it had 100,000 colonies of yeast.  I saw the patient on 07/07/2021.  Patient initially was placed on fluconazole 200 mg and increased to 400 mg and later changed to Amphotericin because of persistent fever on 07/20/2021.Heather Lopez  She also had a left perirenal and renal abscess for which  a drain was placed on 07/21/2021.  The culture was again positive for Candida glabrata.  She was on intermittent dialysis.  Because of persistent leukocytosis a repeat CT scan done on 07/29/2021 showed a 10 cm into 2.29 cm anterior pararenal space collection not in communication with the drain and hence the old drain was removed and a new drain was placed on 08/01/2021. ?She also had a PEG  placement on 08/01/2021. ?She was [facility] on [facility].  Following which [facility]  She saw the urologist Dr. Bernardo Heater on 10/26/2021  and he was planning to schedule ureteroscopy for definitive stone treatment.  drain dell out by itself ?Patient had a CT abdomen done on 11/07/2021 and that CT scan is mentioned that the drains had been removed and also the size of the pararenal abscess was much smaller. ? ? ?Past Medical History:  ?Diagnosis Date  ? Anxiety   ? Cataract   ? Depression   ? Diabetes mellitus without complication ([place])   ? Gout   ? History of kidney stones   ? Hyperlipidemia   ? Hypertension   ? Vertigo   ?  ?Past Surgical History:  ?Procedure Laterality Date  ? CATARACT EXTRACTION, BILATERAL Bilateral 2019  ? COLONOSCOPY WITH PROPOFOL N/A 08/19/2015  ? Procedure: COLONOSCOPY WITH PROPOFOL;  Surgeon: Lucilla Lame, MD;  Location: [place];  Service: Endoscopy;  Laterality: N/A;  diabetic - insulin  ? CYSTOSCOPY W/ RETROGRADES Bilateral 12/22/2019  ? Procedure: CYSTOSCOPY WITH RETROGRADE PYELOGRAM;  Surgeon: Abbie Sons, MD;  Location: [facility];  Service: Urology;  Laterality: Bilateral;  ? CYSTOSCOPY W/ URETERAL STENT PLACEMENT Bilateral 07/05/2021  ? Procedure: CYSTOSCOPY WITH RETROGRADE PYELOGRAM/BILATERAL URETERAL STENT PLACEMENT;  Surgeon: Abbie Sons, MD;  Location: [facility];  Service: Urology;  Laterality: Bilateral;  ? CYSTOSCOPY W/ URETERAL STENT PLACEMENT Bilateral 12/26/2021  ? Procedure: CYSTOSCOPY WITH STENT REMOVAL;  Surgeon: Abbie Sons, MD;  Location: [facility];  Service: Urology;  Laterality: Bilateral;  ? CYSTOSCOPY WITH BIOPSY N/A 12/22/2019  ? Procedure: CYSTOSCOPY WITH bladder BIOPSY;  Surgeon: Abbie Sons, MD;  Location: [facility];  Service: Urology;  Laterality: N/A;  ? CYSTOSCOPY WITH STENT PLACEMENT Right 12/22/2019  ? Procedure: CYSTOSCOPY WITH STENT PLACEMENT;  Surgeon: Abbie Sons, MD;  Location: [facility];  Service: Urology;  Laterality: Right;  ? CYSTOSCOPY WITH URETEROSCOPY Bilateral 12/22/2019  ? Procedure: CYSTOSCOPY WITH URETEROSCOPY;  Surgeon: Abbie Sons, MD;  Location:  [facility];  Service: Urology;  Laterality: Bilateral;  ? CYSTOSCOPY/URETEROSCOPY/HOLMIUM LASER/STENT PLACEMENT Right 12/22/2019  ? Procedure: CYSTOSCOPY/URETEROSCOPY/HOLMIUM LASER/STENT PLACEMENT;  Surgeon: Abbie Sons, MD;  Location: [facility];  Service: Urology;  Laterality: Right;  ? CYSTOSCOPY/URETEROSCOPY/HOLMIUM LASER/STENT PLACEMENT Bilateral 12/08/2021  ? Procedure: CYSTOSCOPY/URETEROSCOPY/HOLMIUM LASER/STENT PLACEMENT;  Surgeon: [name], MD;  Location: [facility];  Service: Urology;  Laterality: Bilateral;  ? DIALYSIS/PERMA CATHETER INSERTION N/A 06/29/2021  ? Procedure: DIALYSIS/PERMA CATHETER INSERTION;  Surgeon: Algernon Huxley, MD;  Location: [place] [facility];  Service: Cardiovascular;  Laterality: N/A;  ? DIALYSIS/PERMA CATHETER INSERTION N/A 07/13/2021  ? Procedure: DIALYSIS/PERMA CATHETER INSERTION;  Surgeon: Algernon Huxley, MD;  Location: [place] [facility];  Service: Cardiovascular;  Laterality: N/A;  ? EYE SURGERY    ? IR GASTROSTOMY TUBE MOD SED  08/01/2021  ? LAPAROTOMY N/A 06/29/2021  ? Procedure: EXPLORATORY LAPAROTOMY WITH REPAIR OF DUODENAL PERFORATION;  Surgeon: [name], MD;  Location: [facility];  Service: General;  Laterality: N/A;  ? POLYPECTOMY  08/19/2015  ? Procedure: POLYPECTOMY;  Surgeon: Lucilla Lame, MD;  Location: [place];  Service: Endoscopy;;  ? [place]  ?  ?Social History  ? ?Socioeconomic History  ? Marital status: Married  ?  Spouse name: Not on file  ? Number of children: Not on file  ? Years of education: Not on file  ? Highest education level: Not on file  ?Occupational History  ? Not on file  ?Tobacco Use  ? Smoking status: Former  ?  Packs/day: 0.75  ?  Years: 6.00  ?  Pack years: 4.50  ?  Types: Cigarettes  ?  Quit date: 7  ?  Years since quitting: 50.2  ? Smokeless tobacco: Never  ? Tobacco comments:  ?  quit 1973  ?Vaping Use  ? Vaping Use: Never used  ?Substance and Sexual Activity  ? Alcohol use: No  ?  Alcohol/week: 0.0 standard  drinks  ? Drug use: No  ? Sexual activity: Not on file  ?Other Topics Concern  ? Not on file  ?Social History Narrative  ? Not on file  ? ?Social Determinants of Health  ? ?Financial Resource Strain: Not

## 2022-03-01 NOTE — Progress Notes (Signed)
I was asked to see the patient by Dr. Delaine Lame for bladder scan due to concerns for possible incomplete bladder emptying contributing to complicated UTI.  She is accompanied today by her husband. ? ?PVR today 32 mL.  She reports bothersome urinary urgency and frequency associated with dysuria with voids occurring every 1-2 hours.  She states she does not experience urgency and frequency in the absence of dysuria. ? ?I shared the patient's bladder scan results with Dr. Delaine Lame who will be treating her for complicated UTI.  I counseled the patient to follow-up in our clinic if her urgency and frequency persist after completing antibiotic therapy, at which point we may consider initiating OAB therapy for her.  Patient is in agreement with this plan. ? ?Results for orders placed or performed in visit on 03/01/22  ?Bladder Scan (Post Void Residual) in office  ?Result Value Ref Range  ? Scan Result 32   ?  ?Debroah Loop, PA-C ?03/01/22 ?2:05 PM ? ?I spent 12 minutes on the day of the encounter to include pre-visit record review, face-to-face time with the patient, and post-visit ordering of tests.  ?

## 2022-03-01 NOTE — Patient Instructions (Signed)
You are here for pseudomonas in urine. You also have symptoms of frequent urination, burning and milky urine- you had incomplete bladder emptying and hydronephrosis in jan- so we need to check for pst void bladder residue and also check renal ultrasound to look for hydronephrosis- appt made with urologist now- also an order placed for ultrasound kidney-  ?

## 2022-03-02 ENCOUNTER — Telehealth: Payer: Self-pay | Admitting: Infectious Diseases

## 2022-03-02 NOTE — Telephone Encounter (Signed)
Spoke to patient about ultrasound result of possible bladder stones and mod hydro rt- asked her to drink plenty of water- discussed with urology PA- She will discuss with Dr>stoioff next week regarding furtehr management ?No antibiotics now ? ?

## 2022-03-06 ENCOUNTER — Other Ambulatory Visit: Payer: Self-pay | Admitting: Physician Assistant

## 2022-03-06 DIAGNOSIS — N1330 Unspecified hydronephrosis: Secondary | ICD-10-CM

## 2022-03-07 ENCOUNTER — Telehealth: Payer: Self-pay | Admitting: Physician Assistant

## 2022-03-07 NOTE — Telephone Encounter (Signed)
Patient calling in regards to her bladder stones and consulting with Dr. Bernardo Heater. Please advise ?

## 2022-03-07 NOTE — Telephone Encounter (Signed)
Patient calling about her bladder stones and what should she do? Please advise ?

## 2022-03-08 ENCOUNTER — Other Ambulatory Visit
Admission: RE | Admit: 2022-03-08 | Discharge: 2022-03-08 | Disposition: A | Discharge status: Home / Self Care | Payer: Medicare Other | Source: Ambulatory Visit | Attending: Physician Assistant | Admitting: Physician Assistant

## 2022-03-08 ENCOUNTER — Other Ambulatory Visit: Payer: Self-pay | Admitting: Physician Assistant

## 2022-03-08 ENCOUNTER — Ambulatory Visit (INDEPENDENT_AMBULATORY_CARE_PROVIDER_SITE_OTHER): Payer: Medicare Other | Admitting: Physician Assistant

## 2022-03-08 ENCOUNTER — Ambulatory Visit
Admission: RE | Admit: 2022-03-08 | Discharge: 2022-03-08 | Disposition: A | Discharge status: Home / Self Care | Payer: Medicare Other | Source: Ambulatory Visit | Attending: Physician Assistant | Admitting: Physician Assistant

## 2022-03-08 ENCOUNTER — Other Ambulatory Visit: Payer: Self-pay

## 2022-03-08 ENCOUNTER — Telehealth: Payer: Self-pay

## 2022-03-08 DIAGNOSIS — R300 Dysuria: Secondary | ICD-10-CM | POA: Diagnosis not present

## 2022-03-08 DIAGNOSIS — N1330 Unspecified hydronephrosis: Secondary | ICD-10-CM

## 2022-03-08 DIAGNOSIS — N390 Urinary tract infection, site not specified: Secondary | ICD-10-CM | POA: Diagnosis not present

## 2022-03-08 DIAGNOSIS — N151 Renal and perinephric abscess: Secondary | ICD-10-CM

## 2022-03-08 DIAGNOSIS — R350 Frequency of micturition: Secondary | ICD-10-CM | POA: Insufficient documentation

## 2022-03-08 LAB — URINALYSIS, COMPLETE (UACMP) WITH MICROSCOPIC
Bacteria, UA: NONE SEEN
Bilirubin Urine: NEGATIVE
Glucose, UA: 50 mg/dL — AB
Ketones, ur: NEGATIVE mg/dL
Nitrite: NEGATIVE
Protein, ur: 100 mg/dL — AB
RBC / HPF: >50 RBC/hpf — ABNORMAL HIGH (ref 0–5)
Specific Gravity, Urine: 1.01 (ref 1.005–1.030)
Squamous Epithelial / HPF: NONE SEEN (ref 0–5)
WBC, UA: >50 WBC/hpf — ABNORMAL HIGH (ref 0–5)
pH: 6 (ref 5.0–8.0)

## 2022-03-08 NOTE — Progress Notes (Signed)
[facility] Posting Form  ?Surgery Date/Time: 03/13/2022 ? ?Surgeon: Dr. John Giovanni, MD ? ?Surgery Location: Day Surgery ? ?Inpt ( No  )   Outpt (Yes)   Obs ( No  )  ? ?Diagnosis: Right Hydronephrosis N13.30 ? ?-CPT: 01586 ? ?Surgery: Right Cystoscopy with ureteral stent placement ? ?Stop Anticoagulations: N/A, may continue ASA ? ?Cardiac/Medical/Pulmonary Clearance needed: no ? ?*Orders entered into EPIC  Date: 03/08/22  ? ?*Case booked in [place]  Date: 03/08/22 ? ?*Notified pt of Surgery: Date: 03/08/22 ? ?PRE-OP UA & CX: yes, obtained in clinic today 03/08/2022 ? ?*Placed into Prior Authorization Work Fabio Bering Date: 03/08/22 ? ? ?Assistant/laser/rep:No ? ? ? ? ? ? ? ? ? ? ? ? ? ? ? ?

## 2022-03-08 NOTE — Progress Notes (Signed)
Surgical Physician Order Form [facility] [place] ? ?Dr. Bernardo Heater * Scheduling expectation :  03/13/2022 ? ?*Length of Case:  ? ?*Clearance needed: no ? ?*Anticoagulation Instructions: N/A ? ?*Aspirin Instructions: Ok to continue Aspirin ? ?*Post-op visit Date/Instructions:  1 month with RUS prior ? ?*Diagnosis: Right Hydronephrosis ? ?*Procedure: right Cysto w/stent placement (21194) ? ? ?Additional orders: N/A ? ?-Admit type: OUTpatient ? ?-Anesthesia: General ? ?-VTE Prophylaxis Standing Order SCD?s    ?   ?Other:  ? ?-Standing Lab Orders Per Anesthesia   ? ?Lab other: None ? ?-Standing Test orders EKG/Chest x-ray per Anesthesia      ? ?Test other:  ? ?- Medications:   TBD per Ucx results ? ?-Other orders:  N/A ? ? ? ?   ?

## 2022-03-08 NOTE — Addendum Note (Signed)
Addended by: Alvera Novel on: 03/08/2022 02:44 PM ? ? Modules accepted: Orders ? ?

## 2022-03-08 NOTE — Telephone Encounter (Signed)
I spoke with [name] and Mr. Heather Lopez. We have discussed possible surgery dates and Tuesday April 18th, 2023 was agreed upon by all parties. Patient given information about surgery date, what to expect pre-operatively and post operatively.  ?We discussed that a Pre-Admission Testing office will be calling to set up the pre-op visit that will take place prior to surgery, and that these appointments are typically done over the phone with a Pre-Admissions RN.  ? ?Informed patient that our office will communicate any additional care to be provided after surgery. Patients questions or concerns were discussed during our call. Advised to call our office should there be any additional information, questions or concerns that arise. Patient verbalized understanding.  ? ?

## 2022-03-09 ENCOUNTER — Telehealth: Payer: Self-pay

## 2022-03-09 LAB — CBC WITH DIFFERENTIAL
Basophils Absolute: 0.1 10*3/uL (ref 0.0–0.2)
Basos: 1 %
EOS (ABSOLUTE): 0.2 10*3/uL (ref 0.0–0.4)
Eos: 1 %
Hematocrit: 31.2 % — ABNORMAL LOW (ref 34.0–46.6)
Hemoglobin: 10.3 g/dL — ABNORMAL LOW (ref 11.1–15.9)
Immature Grans (Abs): 0.1 10*3/uL (ref 0.0–0.1)
Immature Granulocytes: 1 %
Lymphocytes Absolute: 2 10*3/uL (ref 0.7–3.1)
Lymphs: 19 %
MCH: 27.7 pg (ref 26.6–33.0)
MCHC: 33 g/dL (ref 31.5–35.7)
MCV: 84 fL (ref 79–97)
Monocytes Absolute: 0.8 10*3/uL (ref 0.1–0.9)
Monocytes: 7 %
Neutrophils Absolute: 7.4 10*3/uL — ABNORMAL HIGH (ref 1.4–7.0)
Neutrophils: 71 %
RBC: 3.72 x10E6/uL — ABNORMAL LOW (ref 3.77–5.28)
RDW: 16.6 % — ABNORMAL HIGH (ref 11.7–15.4)
WBC: 10.4 10*3/uL (ref 3.4–10.8)

## 2022-03-09 LAB — BASIC METABOLIC PANEL
BUN/Creatinine Ratio: 19 (ref 12–28)
BUN: 42 mg/dL — ABNORMAL HIGH (ref 8–27)
CO2: 17 mmol/L — ABNORMAL LOW (ref 20–29)
Calcium: 10.1 mg/dL (ref 8.7–10.3)
Chloride: 106 mmol/L (ref 96–106)
Creatinine, Ser: 2.16 mg/dL — ABNORMAL HIGH (ref 0.57–1.00)
Glucose: 265 mg/dL — ABNORMAL HIGH (ref 70–99)
Potassium: 5.3 mmol/L — ABNORMAL HIGH (ref 3.5–5.2)
Sodium: 140 mmol/L (ref 134–144)
eGFR: 23 mL/min/{1.73_m2} — ABNORMAL LOW (ref >59)

## 2022-03-09 NOTE — Telephone Encounter (Signed)
Received call from Lyons with [facility]. They were planning on discharging the patient from their services as her wound is now healed, but they received notification that patient will be having a PICC line placed. They have not received antibiotic orders.  ? ?P: [phone] (Tiffany) ? ?Beryle Flock, RN ? ?

## 2022-03-09 NOTE — Telephone Encounter (Signed)
Spoke with Tiffany at [facility], relayed that provider is still deciding on antibiotics and that okay to discharge from home health at this time. Staff will reach out to [facility] to set up once we have more details. ? ?Beryle Flock, RN ? ?

## 2022-03-09 NOTE — Progress Notes (Signed)
? ?03/08/2022 ?11:26 AM  ? ?Heather Lopez ?1946-07-08 ?[id number] ? ?CC: ?Chief Complaint  ?Patient presents with  ? Hydronephrosis  ? ?HPI: ?Heather Lopez is a 76 y.o. female with PMH diabetes, CKD, nephrolithiasis, and chronic right hydroureteronephrosis who had a lengthy hospitalization and rehab admission late last year due to perforated duodenum and obstructing left UPJ stone with perirenal abscess and Candida glabrata positive urine cultures requiring percutaneous drainage who presents today to discuss CT scan results and for surgical planning.  ? ?She saw Dr. Delaine Lame on 03/01/2022 for management of complicated UTI with reports of dysuria, urgency, and frequency.  We performed a bladder scan in clinic that day with a residual of 32 mL.  She subsequently underwent renal ultrasound, which revealed right hydronephrosis.  Her hydronephrosis had previously resolved on renal ultrasound dated 12/03/2021 in the presence of a Foley catheter. ? ?Based on these ultrasound findings, she underwent a CT stone study earlier today, which revealed moderate to severe right hydroureteronephrosis to the level of the bladder with posterior bladder wall thickening and no evidence of obstructing stone.  There is some perivesical stranding concerning for cystitis.  Per chart review, her creatinine has been slightly elevated over baseline over the past month. ? ?Today she reports continued dysuria, urgency, and frequency approximately every hour.  She denies fever, chills, nausea, or vomiting. ? ?PMH: ?Past Medical History:  ?Diagnosis Date  ? Anxiety   ? Cataract   ? Depression   ? Diabetes mellitus without complication ([place]els)   ? Gout   ? History of kidney stones   ? Hyperlipidemia   ? Hypertension   ? Vertigo   ? ? ?Surgical History: ?Past Surgical History:  ?Procedure Laterality Date  ? CATARACT EXTRACTION, BILATERAL Bilateral 2019  ? COLONOSCOPY WITH PROPOFOL N/A 08/19/2015  ? Procedure: COLONOSCOPY WITH PROPOFOL;  Surgeon: Lucilla Lame,  MD;  Location: [facility];  Service: Endoscopy;  Laterality: N/A;  diabetic - insulin  ? CYSTOSCOPY W/ RETROGRADES Bilateral 12/22/2019  ? Procedure: CYSTOSCOPY WITH RETROGRADE PYELOGRAM;  Surgeon: Abbie Sons, MD;  Location: [facility];  Service: Urology;  Laterality: Bilateral;  ? CYSTOSCOPY W/ URETERAL STENT PLACEMENT Bilateral 07/05/2021  ? Procedure: CYSTOSCOPY WITH RETROGRADE PYELOGRAM/BILATERAL URETERAL STENT PLACEMENT;  Surgeon: Abbie Sons, MD;  Location: [facility];  Service: Urology;  Laterality: Bilateral;  ? CYSTOSCOPY W/ URETERAL STENT PLACEMENT Bilateral 12/26/2021  ? Procedure: CYSTOSCOPY WITH STENT REMOVAL;  Surgeon: Abbie Sons, MD;  Location: [facility];  Service: Urology;  Laterality: Bilateral;  ? CYSTOSCOPY WITH BIOPSY N/A 12/22/2019  ? Procedure: CYSTOSCOPY WITH bladder BIOPSY;  Surgeon: Abbie Sons, MD;  Location: [facility];  Service: Urology;  Laterality: N/A;  ? CYSTOSCOPY WITH STENT PLACEMENT Right 12/22/2019  ? Procedure: CYSTOSCOPY WITH STENT PLACEMENT;  Surgeon: Abbie Sons, MD;  Location: [facility];  Service: Urology;  Laterality: Right;  ? CYSTOSCOPY WITH URETEROSCOPY Bilateral 12/22/2019  ? Procedure: CYSTOSCOPY WITH URETEROSCOPY;  Surgeon: Abbie Sons, MD;  Location: [facility];  Service: Urology;  Laterality: Bilateral;  ? CYSTOSCOPY/URETEROSCOPY/HOLMIUM LASER/STENT PLACEMENT Right 12/22/2019  ? Procedure: CYSTOSCOPY/URETEROSCOPY/HOLMIUM LASER/STENT PLACEMENT;  Surgeon: Abbie Sons, MD;  Location: [facility];  Service: Urology;  Laterality: Right;  ? CYSTOSCOPY/URETEROSCOPY/HOLMIUM LASER/STENT PLACEMENT Bilateral 12/08/2021  ? Procedure: CYSTOSCOPY/URETEROSCOPY/HOLMIUM LASER/STENT PLACEMENT;  Surgeon: [name], MD;  Location: [facility];  Service: Urology;  Laterality: Bilateral;  ? DIALYSIS/PERMA CATHETER INSERTION N/A 06/29/2021  ? Procedure: DIALYSIS/PERMA CATHETER INSERTION;  Surgeon: Algernon Huxley,  MD;  Location: [facility];  Service:  Cardiovascular;  Laterality: N/A;  ? DIALYSIS/PERMA CATHETER INSERTION N/A 07/13/2021  ? Procedure: DIALYSIS/PERMA CATHETER INSERTION;  Surgeon: Algernon Huxley, MD;  Location: [facility];  Service: Cardiovascular;  Laterality: N/A;  ? EYE SURGERY    ? IR GASTROSTOMY TUBE MOD SED  08/01/2021  ? LAPAROTOMY N/A 06/29/2021  ? Procedure: EXPLORATORY LAPAROTOMY WITH REPAIR OF DUODENAL PERFORATION;  Surgeon: [name], MD;  Location: [facility];  Service: General;  Laterality: N/A;  ? POLYPECTOMY  08/19/2015  ? Procedure: POLYPECTOMY;  Surgeon: Lucilla Lame, MD;  Location: [facility];  Service: Endoscopy;;  ? [place]  ? ? ?Home Medications:  ?Allergies as of 03/08/2022   ? ?   Reactions  ? Contrast Media  [iodinated Contrast Media] Anaphylaxis  ? Iodine Swelling  ? (IV only) - angioedema  ? ?  ? ?  ?Medication List  ?  ? ?  ? Accurate as of March 08, 2022 11:59 PM. If you have any questions, ask your nurse or doctor.  ?  ?  ? ?  ? ?aspirin 81 MG EC tablet ?Take by mouth. ?  ?buPROPion 100 MG tablet ?Commonly known as: WELLBUTRIN ?Take 100 mg by mouth daily. ?  ?busPIRone 5 MG tablet ?Commonly known as: BUSPAR ?Take 5 mg by mouth daily. ?  ?cloNIDine 0.1 MG tablet ?Commonly known as: CATAPRES ?Take 1 tablet (0.1 mg total) by mouth 2 (two) times daily. ?  ?fluticasone 50 MCG/ACT nasal spray ?Commonly known as: FLONASE ?Place 2 sprays into both nostrils daily as needed for rhinitis. ?  ?insulin aspart 100 UNIT/ML injection ?Commonly known as: novoLOG ?Inject 0-10 Units into the skin 3 (three) times daily before meals. Sliding Scale ?  ?insulin detemir 100 UNIT/ML injection ?Commonly known as: LEVEMIR ?Inject 0.3 mLs (30 Units total) into the skin 2 (two) times daily. ?  ?iron polysaccharides 150 MG capsule ?Commonly known as: NIFEREX ?Take 1 capsule (150 mg total) by mouth daily. ?  ?loperamide 2 MG tablet ?Commonly known as: IMODIUM A-D ?Take 2 mg by mouth every 6 (six) hours as needed for diarrhea or  loose stools. ?  ?metoprolol tartrate 100 MG tablet ?Commonly known as: LOPRESSOR ?Take 1 tablet (100 mg total) by mouth 2 (two) times daily. ?  ?multivitamin with minerals Tabs tablet ?Take 1 tablet by mouth daily. ?  ?polyethylene glycol 17 g packet ?Commonly known as: MIRALAX / GLYCOLAX ?Take 17 g by mouth daily as needed for mild constipation or moderate constipation. ?  ?senna-docusate 8.6-50 MG tablet ?Commonly known as: Senokot-S ?Take 1 tablet by mouth at bedtime as needed for mild constipation or moderate constipation. ?  ?sitaGLIPtin 25 MG tablet ?Commonly known as: JANUVIA ?Place 1 tablet (25 mg total) into feeding tube daily. ?What changed: how to take this ?  ?Trulicity 4.49 QP/5.9FM Sopn ?Generic drug: Dulaglutide ?Inject 0.75 mg into the skin once a week. ?  ?vitamin C 100 MG tablet ?Take 100 mg by mouth daily. ?  ? ?  ? ? ?Allergies:  ?Allergies  ?Allergen Reactions  ? Contrast Media  [Iodinated Contrast Media] Anaphylaxis  ? Iodine Swelling  ?  (IV only) - angioedema  ? ? ?Family History: ?Family History  ?Problem Relation Age of Onset  ? Diabetes Father   ? Cancer Father   ?     lung cancer  ? Diabetes Brother   ? Stroke Mother   ? ? ?  Social History:  ? reports that she quit smoking about 50 years ago. Her smoking use included cigarettes. She has a 4.50 pack-year smoking history. She has never used smokeless tobacco. She reports that she does not drink alcohol and does not use drugs. ? ?Physical Exam: ?There were no vitals taken for this visit.  ?Constitutional:  Alert and oriented, no acute distress, nontoxic appearing ?HEENT: [place], AT ?Cardiovascular: No clubbing, cyanosis, or edema ?Respiratory: Normal respiratory effort, no increased work of breathing ?Skin: No rashes, bruises or suspicious lesions ?Neurologic: Grossly intact, no focal deficits, moving all 4 extremities ?Psychiatric: Normal mood and affect ? ?Laboratory Data: ?Results for orders placed or performed in visit on 03/08/22  ?Basic  metabolic panel  ?Result Value Ref Range  ? Glucose 265 (H) 70 - 99 mg/dL  ? BUN 42 (H) 8 - 27 mg/dL  ? Creatinine, Ser 2.16 (H) 0.57 - 1.00 mg/dL  ? eGFR 23 (L) >59 mL/min/1.73  ? BUN/Creatinine Ratio 19 12

## 2022-03-09 NOTE — H&P (View-Only) (Signed)
? ?03/08/2022 ?11:26 AM  ? ?Heather Lopez ?1946-07-08 ?[id number] ? ?CC: ?Chief Complaint  ?Patient presents with  ? Hydronephrosis  ? ?HPI: ?Heather Lopez is a 76 y.o. female with PMH diabetes, CKD, nephrolithiasis, and chronic right hydroureteronephrosis who had a lengthy hospitalization and rehab admission late last year due to perforated duodenum and obstructing left UPJ stone with perirenal abscess and Candida glabrata positive urine cultures requiring percutaneous drainage who presents today to discuss CT scan results and for surgical planning.  ? ?She saw Dr. Delaine Lame on 03/01/2022 for management of complicated UTI with reports of dysuria, urgency, and frequency.  We performed a bladder scan in clinic that day with a residual of 32 mL.  She subsequently underwent renal ultrasound, which revealed right hydronephrosis.  Her hydronephrosis had previously resolved on renal ultrasound dated 12/03/2021 in the presence of a Foley catheter. ? ?Based on these ultrasound findings, she underwent a CT stone study earlier today, which revealed moderate to severe right hydroureteronephrosis to the level of the bladder with posterior bladder wall thickening and no evidence of obstructing stone.  There is some perivesical stranding concerning for cystitis.  Per chart review, her creatinine has been slightly elevated over baseline over the past month. ? ?Today she reports continued dysuria, urgency, and frequency approximately every hour.  She denies fever, chills, nausea, or vomiting. ? ?PMH: ?Past Medical History:  ?Diagnosis Date  ? Anxiety   ? Cataract   ? Depression   ? Diabetes mellitus without complication ([place])   ? Gout   ? History of kidney stones   ? Hyperlipidemia   ? Hypertension   ? Vertigo   ? ? ?Surgical History: ?Past Surgical History:  ?Procedure Laterality Date  ? CATARACT EXTRACTION, BILATERAL Bilateral 2019  ? COLONOSCOPY WITH PROPOFOL N/A 08/19/2015  ? Procedure: COLONOSCOPY WITH PROPOFOL;  Surgeon: Lucilla Lame,  MD;  Location: [facility];  Service: Endoscopy;  Laterality: N/A;  diabetic - insulin  ? CYSTOSCOPY W/ RETROGRADES Bilateral 12/22/2019  ? Procedure: CYSTOSCOPY WITH RETROGRADE PYELOGRAM;  Surgeon: Abbie Sons, MD;  Location: [facility];  Service: Urology;  Laterality: Bilateral;  ? CYSTOSCOPY W/ URETERAL STENT PLACEMENT Bilateral 07/05/2021  ? Procedure: CYSTOSCOPY WITH RETROGRADE PYELOGRAM/BILATERAL URETERAL STENT PLACEMENT;  Surgeon: Abbie Sons, MD;  Location: [facility];  Service: Urology;  Laterality: Bilateral;  ? CYSTOSCOPY W/ URETERAL STENT PLACEMENT Bilateral 12/26/2021  ? Procedure: CYSTOSCOPY WITH STENT REMOVAL;  Surgeon: Abbie Sons, MD;  Location: [facility];  Service: Urology;  Laterality: Bilateral;  ? CYSTOSCOPY WITH BIOPSY N/A 12/22/2019  ? Procedure: CYSTOSCOPY WITH bladder BIOPSY;  Surgeon: Abbie Sons, MD;  Location: [facility];  Service: Urology;  Laterality: N/A;  ? CYSTOSCOPY WITH STENT PLACEMENT Right 12/22/2019  ? Procedure: CYSTOSCOPY WITH STENT PLACEMENT;  Surgeon: Abbie Sons, MD;  Location: [facility];  Service: Urology;  Laterality: Right;  ? CYSTOSCOPY WITH URETEROSCOPY Bilateral 12/22/2019  ? Procedure: CYSTOSCOPY WITH URETEROSCOPY;  Surgeon: Abbie Sons, MD;  Location: [facility];  Service: Urology;  Laterality: Bilateral;  ? CYSTOSCOPY/URETEROSCOPY/HOLMIUM LASER/STENT PLACEMENT Right 12/22/2019  ? Procedure: CYSTOSCOPY/URETEROSCOPY/HOLMIUM LASER/STENT PLACEMENT;  Surgeon: Abbie Sons, MD;  Location: [facility];  Service: Urology;  Laterality: Right;  ? CYSTOSCOPY/URETEROSCOPY/HOLMIUM LASER/STENT PLACEMENT Bilateral 12/08/2021  ? Procedure: CYSTOSCOPY/URETEROSCOPY/HOLMIUM LASER/STENT PLACEMENT;  Surgeon: [name], MD;  Location: [facility];  Service: Urology;  Laterality: Bilateral;  ? DIALYSIS/PERMA CATHETER INSERTION N/A 06/29/2021  ? Procedure: DIALYSIS/PERMA CATHETER INSERTION;  Surgeon: Algernon Huxley,  MD;  Location: [facility];  Service:  Cardiovascular;  Laterality: N/A;  ? DIALYSIS/PERMA CATHETER INSERTION N/A 07/13/2021  ? Procedure: DIALYSIS/PERMA CATHETER INSERTION;  Surgeon: Algernon Huxley, MD;  Location: [facility];  Service: Cardiovascular;  Laterality: N/A;  ? EYE SURGERY    ? IR GASTROSTOMY TUBE MOD SED  08/01/2021  ? LAPAROTOMY N/A 06/29/2021  ? Procedure: EXPLORATORY LAPAROTOMY WITH REPAIR OF DUODENAL PERFORATION;  Surgeon: [name], MD;  Location: [facility];  Service: General;  Laterality: N/A;  ? POLYPECTOMY  08/19/2015  ? Procedure: POLYPECTOMY;  Surgeon: Lucilla Lame, MD;  Location: [facility];  Service: Endoscopy;;  ? [facility]  ? ? ?Home Medications:  ?Allergies as of 03/08/2022   ? ?   Reactions  ? Contrast Media  [iodinated Contrast Media] Anaphylaxis  ? Iodine Swelling  ? (IV only) - angioedema  ? ?  ? ?  ?Medication List  ?  ? ?  ? Accurate as of March 08, 2022 11:59 PM. If you have any questions, ask your nurse or doctor.  ?  ?  ? ?  ? ?aspirin 81 MG EC tablet ?Take by mouth. ?  ?buPROPion 100 MG tablet ?Commonly known as: WELLBUTRIN ?Take 100 mg by mouth daily. ?  ?busPIRone 5 MG tablet ?Commonly known as: BUSPAR ?Take 5 mg by mouth daily. ?  ?cloNIDine 0.1 MG tablet ?Commonly known as: CATAPRES ?Take 1 tablet (0.1 mg total) by mouth 2 (two) times daily. ?  ?fluticasone 50 MCG/ACT nasal spray ?Commonly known as: FLONASE ?Place 2 sprays into both nostrils daily as needed for rhinitis. ?  ?insulin aspart 100 UNIT/ML injection ?Commonly known as: novoLOG ?Inject 0-10 Units into the skin 3 (three) times daily before meals. Sliding Scale ?  ?insulin detemir 100 UNIT/ML injection ?Commonly known as: LEVEMIR ?Inject 0.3 mLs (30 Units total) into the skin 2 (two) times daily. ?  ?iron polysaccharides 150 MG capsule ?Commonly known as: NIFEREX ?Take 1 capsule (150 mg total) by mouth daily. ?  ?loperamide 2 MG tablet ?Commonly known as: IMODIUM A-D ?Take 2 mg by mouth every 6 (six) hours as needed for diarrhea or  loose stools. ?  ?metoprolol tartrate 100 MG tablet ?Commonly known as: LOPRESSOR ?Take 1 tablet (100 mg total) by mouth 2 (two) times daily. ?  ?multivitamin with minerals Tabs tablet ?Take 1 tablet by mouth daily. ?  ?polyethylene glycol 17 g packet ?Commonly known as: MIRALAX / GLYCOLAX ?Take 17 g by mouth daily as needed for mild constipation or moderate constipation. ?  ?senna-docusate 8.6-50 MG tablet ?Commonly known as: Senokot-S ?Take 1 tablet by mouth at bedtime as needed for mild constipation or moderate constipation. ?  ?sitaGLIPtin 25 MG tablet ?Commonly known as: JANUVIA ?Place 1 tablet (25 mg total) into feeding tube daily. ?What changed: how to take this ?  ?Trulicity 4.49 QP/5.9FM Sopn ?Generic drug: Dulaglutide ?Inject 0.75 mg into the skin once a week. ?  ?vitamin C 100 MG tablet ?Take 100 mg by mouth daily. ?  ? ?  ? ? ?Allergies:  ?Allergies  ?Allergen Reactions  ? Contrast Media  [Iodinated Contrast Media] Anaphylaxis  ? Iodine Swelling  ?  (IV only) - angioedema  ? ? ?Family History: ?Family History  ?Problem Relation Age of Onset  ? Diabetes Father   ? Cancer Father   ?     lung cancer  ? Diabetes Brother   ? Stroke Mother   ? ? ?  Social History:  ? reports that she quit smoking about 50 years ago. Her smoking use included cigarettes. She has a 4.50 pack-year smoking history. She has never used smokeless tobacco. She reports that she does not drink alcohol and does not use drugs. ? ?Physical Exam: ?There were no vitals taken for this visit.  ?Constitutional:  Alert and oriented, no acute distress, nontoxic appearing ?HEENT: [place], AT ?Cardiovascular: No clubbing, cyanosis, or edema ?Respiratory: Normal respiratory effort, no increased work of breathing ?Skin: No rashes, bruises or suspicious lesions ?Neurologic: Grossly intact, no focal deficits, moving all 4 extremities ?Psychiatric: Normal mood and affect ? ?Laboratory Data: ?Results for orders placed or performed in visit on 03/08/22  ?Basic  metabolic panel  ?Result Value Ref Range  ? Glucose 265 (H) 70 - 99 mg/dL  ? BUN 42 (H) 8 - 27 mg/dL  ? Creatinine, Ser 2.16 (H) 0.57 - 1.00 mg/dL  ? eGFR 23 (L) >59 mL/min/1.73  ? BUN/Creatinine Ratio 19 12

## 2022-03-10 LAB — URINE CULTURE: Culture: NO GROWTH

## 2022-03-12 ENCOUNTER — Encounter
Admission: RE | Admit: 2022-03-12 | Discharge: 2022-03-12 | Disposition: A | Discharge status: Home / Self Care | Payer: Medicare Other | Source: Ambulatory Visit | Attending: Urology | Admitting: Urology

## 2022-03-12 ENCOUNTER — Ambulatory Visit
Admission: RE | Admit: 2022-03-12 | Discharge: 2022-03-12 | Disposition: A | Discharge status: Home / Self Care | Payer: Self-pay | Source: Ambulatory Visit | Attending: Infectious Diseases | Admitting: Infectious Diseases

## 2022-03-12 ENCOUNTER — Other Ambulatory Visit: Payer: Self-pay

## 2022-03-12 ENCOUNTER — Ambulatory Visit
Admission: RE | Admit: 2022-03-12 | Discharge: 2022-03-12 | Disposition: A | Discharge status: Home / Self Care | Payer: Medicare Other | Source: Ambulatory Visit | Attending: Infectious Diseases | Admitting: Infectious Diseases

## 2022-03-12 ENCOUNTER — Other Ambulatory Visit: Payer: Self-pay | Admitting: Infectious Diseases

## 2022-03-12 ENCOUNTER — Telehealth: Payer: Self-pay

## 2022-03-12 DIAGNOSIS — R350 Frequency of micturition: Secondary | ICD-10-CM | POA: Insufficient documentation

## 2022-03-12 DIAGNOSIS — N390 Urinary tract infection, site not specified: Secondary | ICD-10-CM

## 2022-03-12 MED ORDER — SODIUM CHLORIDE 0.9% FLUSH: 10.0000 mL | Freq: Two times a day (BID) | INTRAVENOUS | Status: DC

## 2022-03-12 MED ORDER — BUPIVACAINE HCL (PF) 0.5 % IJ SOLN
INTRAMUSCULAR | Status: AC
  Filled 2022-03-12: qty 30

## 2022-03-12 MED ORDER — SODIUM CHLORIDE 0.9 % IV SOLN
2.0000 g | Freq: Once | INTRAVENOUS | Status: AC
  Administered 2022-03-12: 2 g via INTRAVENOUS
  Filled 2022-03-12: qty 2

## 2022-03-12 MED ORDER — DEXTROSE 5 % IV SOLN: 2.0000 g | Freq: Once | INTRAVENOUS | Status: DC

## 2022-03-12 MED ORDER — SODIUM CHLORIDE 0.9% FLUSH
10.0000 mL | INTRAVENOUS | Status: DC | PRN
  Administered 2022-03-12: 10 mL

## 2022-03-12 NOTE — Progress Notes (Signed)
Pts urine cultur from 4/14 came back negative-previous culture was psueodomonas  Resistant to cipro ?She has rt hydronephrosis and going for cystoscopy tomorrow- Will get midline instead of PICC today- will give cefepime 2 grams IV every 24 hrs ( crcl 23) for 3 doses- today, tomorrow and day after- Urine culture will be sent from rt kidney tomorrow and depending on that result we can adjust antibiotics= discussed with urology team, Day surgery and with the patient ?

## 2022-03-12 NOTE — Patient Instructions (Addendum)
Your procedure is scheduled on: Tuesday 03/13/22 ?Report to the Registration Desk on the 1st floor of the [facility]. ?To find out your arrival time, please call [phone] between 1PM - 3PM on: Monday 03/12/22 ? ?REMEMBER: ?Instructions that are not followed completely may result in serious medical risk, up to and including death; or upon the discretion of your surgeon and anesthesiologist your surgery may need to be rescheduled. ? ?Do not eat or drink after midnight the night before surgery.  ?No gum chewing, lozengers or hard candies. ? ?TAKE THESE MEDICATIONS THE MORNING OF SURGERY WITH A SIP OF WATER: ?buPROPion (WELLBUTRIN) 100 MG tablet ?busPIRone (BUSPAR) 5 MG tablet ?metoprolol tartrate (LOPRESSOR) 100 MG tablet ?cloNIDine (CATAPRES) 0.1 MG tablet ? ?Hold your aspirin today. ? ?One week prior to surgery: ?Stop Anti-inflammatories (NSAIDS) such as Advil, Aleve, Ibuprofen, Motrin, Naproxen, Naprosyn and Aspirin based products such as Excedrin, Goodys Powder, BC Powder. ? ?Stop taking your Ascorbic Acid (VITAMIN C) 100 MG tablet,iron polysaccharides (NIFEREX) 150 MG capsule,  Multiple Vitamin (MULTIVITAMIN WITH MINERALS) TABS tablet, senna-docusate (SENOKOT-S) 8.6-50 MG tablet ANY OVER THE COUNTER supplements until after surgery. ? ?You may however, continue to take Tylenol if needed for pain up until the day of surgery. ? ?No Alcohol for 24 hours before or after surgery. ? ?No Smoking including e-cigarettes for 24 hours prior to surgery.  ?No chewable tobacco products for at least 6 hours prior to surgery.  ?No nicotine patches on the day of surgery. ? ?Do not use any "recreational" drugs for at least a week prior to your surgery.  ?Please be advised that the combination of cocaine and anesthesia may have negative outcomes, up to and including death. ?If you test positive for cocaine, your surgery will be cancelled. ? ?On the morning of surgery brush your teeth with toothpaste and water, you may rinse  your mouth with mouthwash if you wish. ?Do not swallow any toothpaste or mouthwash. ? ?Do not wear jewelry, make-up, hairpins, clips or nail polish. ? ?Do not wear lotions, powders, or perfumes.  ? ?Do not shave body from the neck down 48 hours prior to surgery just in case you cut yourself which could leave a site for infection.  ?Also, freshly shaved skin may become irritated if using the CHG soap. ? ?Do not bring valuables to the hospital. [facility] is not responsible for any missing/lost belongings or valuables.  ? ?Notify your doctor if there is any change in your medical condition (cold, fever, infection). ? ?Wear comfortable clothing (specific to your surgery type) to the hospital. ? ?If you are being discharged the day of surgery, you will not be allowed to drive home. ?You will need a responsible adult (18 years or older) to drive you home and stay with you that night.  ? ?If you are taking public transportation, you will need to have a responsible adult (18 years or older) with you. ?Please confirm with your physician that it is acceptable to use public transportation.  ? ?Please call the [facility] at [phone] if you have any questions about these instructions. ? ?Surgery Visitation Policy: ? ?Patients undergoing a surgery or procedure may have two family members or support persons with them as long as the person is not COVID-19 positive or experiencing its symptoms.  ? ?Inpatient Visitation:   ? ?Visiting hours are 7 a.m. to 8 p.m. ?Up to four visitors are allowed at one time in a patient room, including children. The  visitors may rotate out with other people during the day. One designated support person (adult) may remain overnight.  ?

## 2022-03-12 NOTE — Telephone Encounter (Signed)
error 

## 2022-03-12 NOTE — Addendum Note (Signed)
Addended by: Gerald Leitz A on: 03/12/2022 10:12 AM ? ? Modules accepted: Orders ? ?

## 2022-03-13 ENCOUNTER — Ambulatory Visit: Payer: Medicare Other | Admitting: Anesthesiology

## 2022-03-13 ENCOUNTER — Ambulatory Visit
Admission: RE | Admit: 2022-03-13 | Discharge: 2022-03-13 | Disposition: A | Discharge status: Home / Self Care | Payer: Medicare Other | Attending: Urology | Admitting: Urology

## 2022-03-13 ENCOUNTER — Encounter: Payer: Self-pay | Admitting: Urology

## 2022-03-13 ENCOUNTER — Other Ambulatory Visit: Payer: Self-pay | Admitting: Infectious Diseases

## 2022-03-13 ENCOUNTER — Encounter
Admission: RE | Disposition: A | Discharge status: Home / Self Care | Payer: Self-pay | Source: Home / Self Care | Attending: Urology

## 2022-03-13 ENCOUNTER — Ambulatory Visit: Payer: Medicare Other

## 2022-03-13 DIAGNOSIS — E1122 Type 2 diabetes mellitus with diabetic chronic kidney disease: Secondary | ICD-10-CM | POA: Diagnosis not present

## 2022-03-13 DIAGNOSIS — E669 Obesity, unspecified: Secondary | ICD-10-CM | POA: Insufficient documentation

## 2022-03-13 DIAGNOSIS — N1370 Vesicoureteral-reflux, unspecified: Secondary | ICD-10-CM | POA: Diagnosis not present

## 2022-03-13 DIAGNOSIS — I120 Hypertensive chronic kidney disease with stage 5 chronic kidney disease or end stage renal disease: Secondary | ICD-10-CM | POA: Diagnosis not present

## 2022-03-13 DIAGNOSIS — N136 Pyonephrosis: Secondary | ICD-10-CM | POA: Diagnosis present

## 2022-03-13 DIAGNOSIS — Z87891 Personal history of nicotine dependence: Secondary | ICD-10-CM | POA: Diagnosis not present

## 2022-03-13 DIAGNOSIS — Z6833 Body mass index (BMI) 33.0-33.9, adult: Secondary | ICD-10-CM | POA: Insufficient documentation

## 2022-03-13 DIAGNOSIS — N185 Chronic kidney disease, stage 5: Secondary | ICD-10-CM | POA: Insufficient documentation

## 2022-03-13 DIAGNOSIS — N3020 Other chronic cystitis without hematuria: Secondary | ICD-10-CM

## 2022-03-13 DIAGNOSIS — N390 Urinary tract infection, site not specified: Secondary | ICD-10-CM

## 2022-03-13 DIAGNOSIS — N1330 Unspecified hydronephrosis: Secondary | ICD-10-CM

## 2022-03-13 DIAGNOSIS — Z01810 Encounter for preprocedural cardiovascular examination: Secondary | ICD-10-CM | POA: Diagnosis not present

## 2022-03-13 HISTORY — PX: CYSTOSCOPY WITH BIOPSY: SHX5122

## 2022-03-13 HISTORY — PX: CYSTOSCOPY W/ URETERAL STENT PLACEMENT: SHX1429

## 2022-03-13 LAB — GLUCOSE, CAPILLARY
Glucose-Capillary: 157 mg/dL — ABNORMAL HIGH (ref 70–99)
Glucose-Capillary: 167 mg/dL — ABNORMAL HIGH (ref 70–99)

## 2022-03-13 SURGERY — CYSTOSCOPY, WITH RETROGRADE PYELOGRAM AND URETERAL STENT INSERTION
Anesthesia: General | Laterality: Right

## 2022-03-13 MED ORDER — ONDANSETRON HCL 4 MG/2ML IJ SOLN
INTRAMUSCULAR | Status: AC
  Filled 2022-03-13: qty 2

## 2022-03-13 MED ORDER — FENTANYL CITRATE (PF) 100 MCG/2ML IJ SOLN
INTRAMUSCULAR | Status: DC | PRN
  Administered 2022-03-13 (×2): 50 ug via INTRAVENOUS

## 2022-03-13 MED ORDER — SODIUM CHLORIDE 0.9 % IV SOLN
2.0000 g | Freq: Once | INTRAVENOUS | Status: AC
  Administered 2022-03-13: 2 g via INTRAVENOUS
  Filled 2022-03-13: qty 2

## 2022-03-13 MED ORDER — FENTANYL CITRATE (PF) 100 MCG/2ML IJ SOLN: 25.0000 ug | INTRAMUSCULAR | Status: DC | PRN

## 2022-03-13 MED ORDER — LACTATED RINGERS IV SOLN: INTRAVENOUS | Status: DC

## 2022-03-13 MED ORDER — DEXMEDETOMIDINE HCL IN NACL 80 MCG/20ML IV SOLN
INTRAVENOUS | Status: AC
  Filled 2022-03-13: qty 20

## 2022-03-13 MED ORDER — DEXTROSE 5 % IV SOLN: 2.0000 g | INTRAVENOUS | Status: AC

## 2022-03-13 MED ORDER — PROPOFOL 10 MG/ML IV BOLUS
INTRAVENOUS | Status: DC | PRN
  Administered 2022-03-13: 120 mg via INTRAVENOUS

## 2022-03-13 MED ORDER — OXYCODONE HCL 5 MG PO TABS
5.0000 mg | ORAL_TABLET | Freq: Once | ORAL | Status: AC | PRN
  Administered 2022-03-13: 5 mg via ORAL

## 2022-03-13 MED ORDER — IOHEXOL 180 MG/ML  SOLN
INTRAMUSCULAR | Status: DC | PRN
  Administered 2022-03-13: 30 mL via ORAL

## 2022-03-13 MED ORDER — ONDANSETRON HCL 4 MG/2ML IJ SOLN: 4.0000 mg | Freq: Once | INTRAMUSCULAR | Status: DC | PRN

## 2022-03-13 MED ORDER — CHLORHEXIDINE GLUCONATE 0.12 % MT SOLN
OROMUCOSAL | Status: AC
  Administered 2022-03-13: 15 mL
  Filled 2022-03-13: qty 15

## 2022-03-13 MED ORDER — HEPARIN SOD (PORK) LOCK FLUSH 100 UNIT/ML IV SOLN
INTRAVENOUS | Status: AC
  Filled 2022-03-13: qty 5

## 2022-03-13 MED ORDER — FAMOTIDINE 20 MG PO TABS
ORAL_TABLET | ORAL | Status: AC
  Administered 2022-03-13: 20 mg
  Filled 2022-03-13: qty 1

## 2022-03-13 MED ORDER — DEXAMETHASONE SODIUM PHOSPHATE 10 MG/ML IJ SOLN
INTRAMUSCULAR | Status: AC
  Filled 2022-03-13: qty 1

## 2022-03-13 MED ORDER — DEXMEDETOMIDINE HCL IN NACL 200 MCG/50ML IV SOLN
INTRAVENOUS | Status: DC | PRN
  Administered 2022-03-13: 8 ug via INTRAVENOUS
  Administered 2022-03-13 (×2): 4 ug via INTRAVENOUS

## 2022-03-13 MED ORDER — LIDOCAINE HCL (CARDIAC) PF 100 MG/5ML IV SOSY
PREFILLED_SYRINGE | INTRAVENOUS | Status: DC | PRN
  Administered 2022-03-13: 100 mg via INTRAVENOUS

## 2022-03-13 MED ORDER — ACETAMINOPHEN 10 MG/ML IV SOLN
1000.0000 mg | Freq: Once | INTRAVENOUS | Status: DC | PRN
  Administered 2022-03-13: 1000 mg via INTRAVENOUS

## 2022-03-13 MED ORDER — OXYCODONE HCL 5 MG PO TABS
ORAL_TABLET | ORAL | Status: AC
  Filled 2022-03-13: qty 1

## 2022-03-13 MED ORDER — DEXAMETHASONE SODIUM PHOSPHATE 10 MG/ML IJ SOLN
INTRAMUSCULAR | Status: DC | PRN
  Administered 2022-03-13: 5 mg via INTRAVENOUS

## 2022-03-13 MED ORDER — OXYCODONE HCL 5 MG/5ML PO SOLN: 5.0000 mg | Freq: Once | ORAL | Status: AC | PRN

## 2022-03-13 MED ORDER — ONDANSETRON HCL 4 MG/2ML IJ SOLN
INTRAMUSCULAR | Status: DC | PRN
  Administered 2022-03-13: 4 mg via INTRAVENOUS

## 2022-03-13 MED ORDER — URIBEL 118 MG PO CAPS: 1.0000 | ORAL_CAPSULE | Freq: Three times a day (TID) | ORAL | 0 refills | Status: DC | PRN

## 2022-03-13 MED ORDER — LACTATED RINGERS IV SOLN: INTRAVENOUS | Status: DC | PRN

## 2022-03-13 MED ORDER — SODIUM CHLORIDE 0.9 % IR SOLN
Status: DC | PRN
Start: 2022-03-13 — End: 2022-03-13
  Administered 2022-03-13: 3000 mL via INTRAVESICAL

## 2022-03-13 MED ORDER — FENTANYL CITRATE (PF) 100 MCG/2ML IJ SOLN
INTRAMUSCULAR | Status: AC
  Filled 2022-03-13: qty 2

## 2022-03-13 MED ORDER — ACETAMINOPHEN 10 MG/ML IV SOLN
INTRAVENOUS | Status: AC
  Filled 2022-03-13: qty 100

## 2022-03-13 SURGICAL SUPPLY — 19 items
BAG DRAIN CYSTO-URO LG1000N (MISCELLANEOUS) ×3 IMPLANT
BRUSH SCRUB EZ 1% IODOPHOR (MISCELLANEOUS) ×3 IMPLANT
CATH URETL OPEN 5X70 (CATHETERS) IMPLANT
GAUZE 4X4 16PLY ~~LOC~~+RFID DBL (SPONGE) ×4 IMPLANT
GLOVE SURG UNDER POLY LF SZ7.5 (GLOVE) ×3 IMPLANT
GOWN STRL REUS W/ TWL XL LVL3 (GOWN DISPOSABLE) ×2 IMPLANT
GOWN STRL REUS W/TWL XL LVL3 (GOWN DISPOSABLE) ×3
GUIDEWIRE STR DUAL SENSOR (WIRE) ×3 IMPLANT
IV NS IRRIG 3000ML ARTHROMATIC (IV SOLUTION) ×3 IMPLANT
KIT TURNOVER CYSTO (KITS) ×3 IMPLANT
MANIFOLD NEPTUNE II (INSTRUMENTS) ×2 IMPLANT
PACK CYSTO AR (MISCELLANEOUS) ×3 IMPLANT
SET CYSTO W/LG BORE CLAMP LF (SET/KITS/TRAYS/PACK) ×3 IMPLANT
STENT URET 6FRX22 CONTOUR (STENTS) ×1 IMPLANT
STENT URET 6FRX24 CONTOUR (STENTS) IMPLANT
STENT URET 6FRX26 CONTOUR (STENTS) IMPLANT
SURGILUBE 2OZ TUBE FLIPTOP (MISCELLANEOUS) ×3 IMPLANT
WATER STERILE IRR 1000ML POUR (IV SOLUTION) ×2 IMPLANT
WATER STERILE IRR 500ML POUR (IV SOLUTION) ×3 IMPLANT

## 2022-03-13 NOTE — Discharge Instructions (Addendum)
AMBULATORY SURGERY  ?DISCHARGE INSTRUCTIONS ? ? ?The drugs that you were given will stay in your system until tomorrow so for the next 24 hours you should not: ? ?Drive an automobile ?Make any legal decisions ?Drink any alcoholic beverage ? ? ?You may resume regular meals tomorrow.  Today it is better to start with liquids and gradually work up to solid foods. ? ?You may eat anything you prefer, but it is better to start with liquids, then soup and crackers, and gradually work up to solid foods. ? ? ?Please notify your doctor immediately if you have any unusual bleeding, trouble breathing, redness and pain at the surgery site, drainage, fever, or pain not relieved by medication. ? ? ? ?Additional Instructions: ? ?DISCHARGE INSTRUCTIONS FOR URETERAL STENT  ? ?MEDICATIONS:  ?1. Resume all your other meds from home.  ?2.  Rx Urib[facility] was [facility] to [facility] for bladder irritation/burning ? ? ?ACTIVITY:  ?1. May resume regular activities in 24 hours. ?2. No driving while on narcotic pain medications  ?3. Drink plenty of water  ?4. Continue to walk at home - you can still get blood clots when you are at home, so keep active, but don't over do it.  ?5. May return to work/school tomorrow or when you feel ready  ? ? ?SIGNS/SYMPTOMS TO CALL:  ?Common postoperative symptoms include urinary frequency, urgency, bladder spasm and blood in the urine ? ?Please call us if you have a fever greater than 101.5, uncontrolled nausea/vomiting, uncontrolled pain, dizziness, unable to urinate, excessively bloody urine, chest pain, shortness of breath, leg swelling, leg pain, or any other concerns or questions.  ? ?You can reach [place] at [phone].  ? ?FOLLOW-UP:  ?1.  You will be contacted by our office for an appointment for cystoscopy with stent removal ? ? ? ? ? ? ?Please contact your physician with any problems or Same Day Surgery at [phone], Monday through Friday 6 am to 4 pm, or [facility] number at  [phone].  ?

## 2022-03-13 NOTE — Anesthesia Procedure Notes (Signed)
Procedure Name: LMA Insertion ?Date/Time: 03/13/2022 9:38 AM ?Performed by: Cammie Sickle, CRNA ?Pre-anesthesia Checklist: Patient identified, Patient being monitored, Timeout performed, Emergency Drugs available and Suction available ?Patient Re-evaluated:Patient Re-evaluated prior to induction ?Oxygen Delivery Method: Circle system utilized ?Preoxygenation: Pre-oxygenation with 100% oxygen ?Induction Type: IV induction ?Ventilation: Mask ventilation without difficulty ?LMA: LMA inserted ?LMA Size: 3.0 ?Tube type: Oral ?Number of attempts: 1 ?Placement Confirmation: positive ETCO2 and breath sounds checked- equal and bilateral ?Tube secured with: Tape ?Dental Injury: Teeth and Oropharynx as per pre-operative assessment  ? ? ? ? ?

## 2022-03-13 NOTE — Op Note (Signed)
Preoperative diagnosis:  ?Right hydronephrosis ?Recurrent UTI ?Dysuria ? ?Postoperative diagnosis:  ?Right hydronephrosis-probable nonobstructive ?Bilateral vesicoureteral reflux ?Bladder erythema ? ?Procedure: ?Cystoscopy with right retrograde pyelogram ?Cystogram ?Bladder biopsy ?Right ureteral stent placement ? ?Surgeon: Abbie Sons, MD ? ?Anesthesia: General ? ?Complications: None ? ?Intraoperative findings:  ?Cystoscopy-patchy bladder erythema with hemorrhagic areas.  No solid or papillary lesions.  "Golf-hole" ureteral orifices bilaterally ?Cystogram-bilateral vesicoureteral reflux L >R ?Right retrograde pyelogram moderate right hydronephrosis.  Normal appearing ureter ? ? ?EBL: Minimal ? ?Specimens: Urine right renal pelvis for culture ? ?Indication: Heather Lopez is a 76 y.o. with a prior history of bilateral nephrolithiasis and renal abscess with sepsis.  Recently noted to have abnormal urine with positive cultures and dysuria/storage related voiding symptoms.  She has had worsening right hydronephrosis.  Preoperative urine culture no growth.  After reviewing the management options for treatment, he elected to proceed with the above surgical procedure(s). We have discussed the potential benefits and risks of the procedure, side effects of the proposed treatment, the likelihood of the patient achieving the goals of the procedure, and any potential problems that might occur during the procedure or recuperation. Informed consent has been obtained. ? ?Description of procedure: ? ?The patient was taken to the operating room and general anesthesia was induced.  The patient was placed in the dorsal lithotomy position, prepped and draped in the usual sterile fashion, and preoperative antibiotics were administered. A preoperative time-out was performed.  ? ?A 21 French cystoscope was lubricated and passed per urethra.  Panendoscopy was performed with findings as described above.  Due to the appearance of her UOs  the bladder was instilled with 30 cc of contrast and further filled with saline from the cystoscope with findings as described above. ? ?A 0.038 Sensor wire was placed through the cystoscope and into the right UO and advanced up to the renal pelvis without difficulty.  A 5 French open ended ureteral catheter was then placed over the wire to the renal pelvis.  The guidewire was removed and 10 mL of slightly cloudy urine was aspirated and sent for culture. ? ?Pullout retrograde pyelogram was then performed with findings as described above. ? ?Based on her negative urine culture it was elected to perform biopsies of the erythematous areas and cold cup biopsies of the right lateral, posterior and left lateral walls were performed.  Biopsy sites were fulgurated with a Bugbee electrode. ? ?There was persistent contrast in the right collecting systems which may have been secondary to her dependent position and not obstructive.  Only faint contrast remained in the ureter.  Although her hydronephrosis may be secondary to reflux it was elected to place a temporary right ureteral stent. ? ?The guidewire was replaced and a 6F/22 cm Contour ureteral stent was placed over the guidewire with a good curl in the renal pelvis noted on fluoroscopy and in the bladder under direct vision.  Bladder was surveyed and hemostasis was adequate.  The cystoscope was removed. ? ?After anesthetic reversal she was transported to the PACU in stable condition. ? ?Plan: ?Follow-up antibiotic therapy per infectious disease ?Indwelling stent x2 weeks ?Await bladder biopsy results ? ? ?Abbie Sons, M.D. ? ?

## 2022-03-13 NOTE — Progress Notes (Signed)
Cefepime 2 grams IV every 24 hrs on 4/19 and 4/20 at the day surgery. Spoke to [employer]. ?

## 2022-03-13 NOTE — Anesthesia Postprocedure Evaluation (Signed)
Anesthesia Post Note ? ?Patient: Heather Lopez ? ?Procedure(s) Performed: CYSTOSCOPY WITH RETROGRADE PYELOGRAM/URETERAL STENT PLACEMENT (Right) ?CYSTOSCOPY WITH BLADDER BIOPSY ? ?Patient location during evaluation: PACU ?Anesthesia Type: General ?Level of consciousness: awake and alert, oriented and patient cooperative ?Pain management: pain level controlled ?Vital Signs Assessment: post-procedure vital signs reviewed and stable ?Respiratory status: spontaneous breathing, nonlabored ventilation and respiratory function stable ?Cardiovascular status: blood pressure returned to baseline and stable ?Postop Assessment: adequate PO intake ?Anesthetic complications: no ? ? ?No notable events documented. ? ? ?Last Vitals:  ?Vitals:  ? 03/13/22 1059 03/13/22 1108  ?BP: (!) 136/59 124/66  ?Pulse: 73 67  ?Resp: 14 15  ?Temp: 36.6 ?C 36.4 ?C  ?SpO2: 97% 99%  ?  ?Last Pain:  ?Vitals:  ? 03/13/22 1108  ?TempSrc: Temporal  ?PainSc: 4   ? ? ?  ?  ?  ?  ?  ?  ? ?Darrin Nipper ? ? ? ? ?

## 2022-03-13 NOTE — OR Nursing (Signed)
Flushed midline access.  To have another dose of anitibiotic tomorrow.  Instructed to come to SDS at 12:00 tomorrow. ?

## 2022-03-13 NOTE — Transfer of Care (Signed)
Immediate Anesthesia Transfer of Care Note ? ?Patient: Heather Lopez ? ?Procedure(s) Performed: CYSTOSCOPY WITH RETROGRADE PYELOGRAM/URETERAL STENT PLACEMENT (Right) ?CYSTOSCOPY WITH BLADDER BIOPSY ? ?Patient Location: PACU ? ?Anesthesia Type:General ? ?Level of Consciousness: drowsy ? ?Airway & Oxygen Therapy: Patient Spontanous Breathing and Patient connected to face mask oxygen ? ?Post-op Assessment: Report given to RN and Post -op Vital signs reviewed and stable ? ?Post vital signs: Reviewed and stable ? ?Last Vitals:  ?Vitals Value Taken Time  ?BP 130/65 03/13/22 1030  ?Temp 36.6 ?C 03/13/22 1030  ?Pulse 70 03/13/22 1034  ?Resp 16 03/13/22 1034  ?SpO2 96 % 03/13/22 1034  ?Vitals shown include unvalidated device data. ? ?Last Pain:  ?Vitals:  ? 03/13/22 1030  ?TempSrc:   ?PainSc: Asleep  ?   ? ?Patients Stated Pain Goal: 0 (03/13/22 0740) ? ?Complications: No notable events documented. ?

## 2022-03-13 NOTE — Anesthesia Preprocedure Evaluation (Addendum)
Anesthesia Evaluation  ?Patient identified by MRN, date of birth, ID band ?Patient awake ? ? ? ?Reviewed: ?Allergy & Precautions, NPO status , Patient's Chart, lab work & pertinent test results ? ?History of Anesthesia Complications ?Negative for: history of anesthetic complications ? ?Airway ?Mallampati: IV ? ? ?Neck ROM: Full ? ? ? Dental ? ?(+) Missing ?  ?Pulmonary ?former smoker (quit 1973),  ?  ?Pulmonary exam normal ?breath sounds clear to auscultation ? ? ? ? ? ? Cardiovascular ?hypertension, Normal cardiovascular exam ?Rhythm:Regular Rate:Normal ? ?ECG 12/03/21:  ?Sinus rhythm with short PR ?Minimal voltage criteria for LVH, may be normal variant ( R in aVL ) ?Anterior infarct , age undetermined ?  ?Neuro/Psych ?PSYCHIATRIC DISORDERS Anxiety Depression Vertigo  ?  ? GI/Hepatic ?negative GI ROS,   ?Endo/Other  ?diabetes, Type 2Obesity  ? Renal/GU ?Renal disease (stage IV CKD, nephrolithiasis)  ? ?  ?Musculoskeletal ?Gout   ? Abdominal ?  ?Peds ? Hematology ? ?(+) Blood dyscrasia, anemia ,   ?Anesthesia Other Findings ? ? Reproductive/Obstetrics ? ?  ? ? ? ? ? ? ? ? ? ? ? ? ? ?  ?  ? ? ? ? ? ? ? ?Anesthesia Physical ?Anesthesia Plan ? ?ASA: 3 ? ?Anesthesia Plan: General  ? ?Post-op Pain Management:   ? ?Induction: Intravenous ? ?PONV Risk Score and Plan: 3 and Ondansetron, Dexamethasone and Treatment may vary due to age or medical condition ? ?Airway Management Planned: LMA ? ?Additional Equipment:  ? ?Intra-op Plan:  ? ?Post-operative Plan: Extubation in OR ? ?Informed Consent: I have reviewed the patients History and Physical, chart, labs and discussed the procedure including the risks, benefits and alternatives for the proposed anesthesia with the patient or authorized representative who has indicated his/her understanding and acceptance.  ? ? ? ?Dental advisory given ? ?Plan Discussed with: CRNA ? ?Anesthesia Plan Comments: (Patient consented for risks of anesthesia  including but not limited to:  ?- adverse reactions to medications ?- damage to eyes, teeth, lips or other oral mucosa ?- nerve damage due to positioning  ?- sore throat or hoarseness ?- damage to heart, brain, nerves, lungs, other parts of body or loss of life ? ?Informed patient about role of CRNA in peri- and intra-operative care.  Patient voiced understanding.)  ? ? ? ? ? ? ?Anesthesia Quick Evaluation ? ?

## 2022-03-13 NOTE — Interval H&P Note (Signed)
History and Physical Interval Note: ? ?CV: RRR ?Lungs: Clear ? ?03/13/2022 ?8:30 AM ? ?[name]  has presented today for surgery, with the diagnosis of Right Hydronephrosis.  The various methods of treatment have been discussed with the patient and family. After consideration of risks, benefits and other options for treatment, the patient has consented to  Procedure(s): ?[place] (Right) as a surgical intervention.  The patient's history has been reviewed, patient examined, no change in status, stable for surgery.  I have reviewed the patient's chart and labs.  Questions were answered to the patient's satisfaction.   ? ? ?Deshanna Kama C Etienne Millward ? ? ?

## 2022-03-14 ENCOUNTER — Ambulatory Visit
Admission: RE | Admit: 2022-03-14 | Discharge: 2022-03-14 | Disposition: A | Discharge status: Home / Self Care | Payer: Medicare Other | Source: Home / Self Care | Attending: Infectious Diseases | Admitting: Infectious Diseases

## 2022-03-14 DIAGNOSIS — N1330 Unspecified hydronephrosis: Secondary | ICD-10-CM | POA: Diagnosis not present

## 2022-03-14 DIAGNOSIS — K219 Gastro-esophageal reflux disease without esophagitis: Secondary | ICD-10-CM | POA: Diagnosis not present

## 2022-03-14 DIAGNOSIS — N3289 Other specified disorders of bladder: Secondary | ICD-10-CM | POA: Diagnosis not present

## 2022-03-14 LAB — URINE CULTURE: Culture: NO GROWTH

## 2022-03-14 LAB — SURGICAL PATHOLOGY

## 2022-03-14 MED ORDER — SODIUM CHLORIDE FLUSH 0.9 % IV SOLN
INTRAVENOUS | Status: AC
  Filled 2022-03-14: qty 10

## 2022-03-14 MED ORDER — SODIUM CHLORIDE 0.9 % IV SOLN
2.0000 g | Freq: Once | INTRAVENOUS | Status: AC
  Administered 2022-03-14: 2 g via INTRAVENOUS
  Filled 2022-03-14: qty 2

## 2022-03-14 NOTE — Progress Notes (Signed)
On arrival patient showed me a small red rash to her arms and chest.  She stated she felt it was from the medication she received in surgery yesterday.  She asked if it was ok to take Benadryl when she got home, which we said was ok.  I advised her that if it did not clear up in the next 24 hours to notify the MD.  Patient received her dose of Cefepime an tolerated it well.  VSS.  Midline was flushed.  Patient will be back tomorrow for one more dose. ?

## 2022-03-15 ENCOUNTER — Ambulatory Visit
Admission: RE | Admit: 2022-03-15 | Discharge: 2022-03-15 | Disposition: A | Discharge status: Home / Self Care | Payer: Medicare Other | Source: Ambulatory Visit | Attending: Infectious Diseases | Admitting: Infectious Diseases

## 2022-03-15 DIAGNOSIS — N390 Urinary tract infection, site not specified: Secondary | ICD-10-CM | POA: Diagnosis present

## 2022-03-16 ENCOUNTER — Encounter: Payer: Self-pay | Admitting: *Deleted

## 2022-03-29 ENCOUNTER — Encounter: Payer: Medicare Other | Admitting: Urology

## 2022-04-04 ENCOUNTER — Ambulatory Visit (INDEPENDENT_AMBULATORY_CARE_PROVIDER_SITE_OTHER): Payer: Medicare Other | Admitting: Urology

## 2022-04-04 ENCOUNTER — Encounter: Payer: Self-pay | Admitting: Urology

## 2022-04-04 VITALS — BP 147/84 | HR 75 | Ht 63.0 in | Wt 190.0 lb

## 2022-04-04 DIAGNOSIS — R350 Frequency of micturition: Secondary | ICD-10-CM

## 2022-04-04 DIAGNOSIS — N200 Calculus of kidney: Secondary | ICD-10-CM

## 2022-04-04 LAB — URINALYSIS, COMPLETE
Bilirubin, UA: NEGATIVE
Glucose, UA: NEGATIVE
Ketones, UA: NEGATIVE
Nitrite, UA: POSITIVE — AB
Specific Gravity, UA: >1.03 — ABNORMAL HIGH (ref 1.005–1.030)
Urobilinogen, Ur: 0.2 mg/dL (ref 0.2–1.0)
pH, UA: 6 (ref 5.0–7.5)

## 2022-04-04 LAB — MICROSCOPIC EXAMINATION: WBC, UA: >30 /hpf — AB (ref 0–5)

## 2022-04-04 MED ORDER — LEVOFLOXACIN 500 MG PO TABS: 500.0000 mg | ORAL_TABLET | Freq: Once | ORAL | Status: DC

## 2022-04-05 ENCOUNTER — Other Ambulatory Visit: Payer: Self-pay | Admitting: *Deleted

## 2022-04-05 ENCOUNTER — Telehealth: Payer: Self-pay

## 2022-04-05 MED ORDER — SULFAMETHOXAZOLE-TRIMETHOPRIM 800-160 MG PO TABS
1.0000 | ORAL_TABLET | Freq: Two times a day (BID) | ORAL | 0 refills | Status: DC
Start: 2022-04-05 — End: 2022-04-19

## 2022-04-05 NOTE — Telephone Encounter (Signed)
Incoming VM on triage line from pt in regards to an ABX being sent in for her. Pt states she was here yesterday and was told she had a UTI and Abx was being sent in. Pt states she is in a lot of pain.  ?

## 2022-04-05 NOTE — Progress Notes (Signed)
Patient was scheduled for stent removal today however urine nitrite positive with pyuria.  She was also symptomatic.  Urine culture ordered stent removal rescheduled ?

## 2022-04-05 NOTE — Telephone Encounter (Signed)
Send in Rx Septra DS 1 twice daily x7 days.  Let her know antibiotic may need to be changed when urine culture results are back ?

## 2022-04-06 ENCOUNTER — Encounter: Payer: Self-pay | Admitting: *Deleted

## 2022-04-06 LAB — CULTURE, URINE COMPREHENSIVE

## 2022-04-10 ENCOUNTER — Telehealth: Payer: Self-pay | Admitting: Internal Medicine

## 2022-04-10 ENCOUNTER — Telehealth: Payer: Self-pay | Admitting: *Deleted

## 2022-04-10 NOTE — Telephone Encounter (Signed)
pt req to have appt cancelled, would not like to r/s at the moment.Heather Lopez  ?

## 2022-04-10 NOTE — Telephone Encounter (Signed)
Patient called stating she needs to cancel her appointment Friday and to please call her ?

## 2022-04-11 ENCOUNTER — Inpatient Hospital Stay: Payer: Medicare Other

## 2022-04-13 ENCOUNTER — Inpatient Hospital Stay: Payer: Medicare Other | Admitting: Internal Medicine

## 2022-04-19 ENCOUNTER — Ambulatory Visit (INDEPENDENT_AMBULATORY_CARE_PROVIDER_SITE_OTHER): Payer: Medicare Other | Admitting: Urology

## 2022-04-19 ENCOUNTER — Encounter: Payer: Self-pay | Admitting: Urology

## 2022-04-19 VITALS — BP 151/74 | HR 73 | Ht 63.0 in | Wt 190.0 lb

## 2022-04-19 DIAGNOSIS — N1330 Unspecified hydronephrosis: Secondary | ICD-10-CM

## 2022-04-19 DIAGNOSIS — N200 Calculus of kidney: Secondary | ICD-10-CM

## 2022-04-19 DIAGNOSIS — R350 Frequency of micturition: Secondary | ICD-10-CM

## 2022-04-19 LAB — URINALYSIS, COMPLETE
Bilirubin, UA: NEGATIVE
Ketones, UA: NEGATIVE
Nitrite, UA: NEGATIVE
Specific Gravity, UA: 1.025 (ref 1.005–1.030)
Urobilinogen, Ur: 0.2 mg/dL (ref 0.2–1.0)
pH, UA: 6 (ref 5.0–7.5)

## 2022-04-19 LAB — MICROSCOPIC EXAMINATION: WBC, UA: >30 /hpf — AB (ref 0–5)

## 2022-04-19 MED ORDER — CIPROFLOXACIN HCL 500 MG PO TABS
500.0000 mg | ORAL_TABLET | Freq: Once | ORAL | Status: AC
  Administered 2022-04-19: 500 mg via ORAL

## 2022-04-19 MED ORDER — SULFAMETHOXAZOLE-TRIMETHOPRIM 800-160 MG PO TABS: 1.0000 | ORAL_TABLET | Freq: Two times a day (BID) | ORAL | 0 refills | Status: DC

## 2022-04-19 NOTE — Progress Notes (Signed)
76 y.o. female scheduled for stent removal.  Prior urinalysis x2 with significant pyuria and nitrite positive.  Last culture grew Klebsiella and she was improving on Septra DS however has had recurrent symptoms.  She has nonobstructive hydronephrosis felt secondary to reflux.  Her external genitalia were prepped and draped.  A flexible cystoscope was inserted per urethra.  The stent could not be visualized due to purulent urine.  Urine was aspirated with a syringe however visualization was still suboptimal.  Plan: Urine was resent for culture.  Septra DS was refilled.  Will schedule stent removal in same-day surgery under sedation.

## 2022-04-20 ENCOUNTER — Other Ambulatory Visit: Payer: Self-pay | Admitting: Urology

## 2022-04-20 ENCOUNTER — Encounter: Payer: Medicare Other | Admitting: Urology

## 2022-04-20 DIAGNOSIS — N1330 Unspecified hydronephrosis: Secondary | ICD-10-CM

## 2022-04-20 NOTE — Progress Notes (Unsigned)
Surgical Physician Order Form [facility] [place]  * Scheduling expectation : Next Available  *Length of Case: 15 min  *Clearance needed: no  *Anticoagulation Instructions: N/A  *Aspirin Instructions: Ok to continue Aspirin  *Post-op visit Date/Instructions:  1 month follow up with PA  *Diagnosis: Right Hydronephrosis  *Procedure:  Cystoscopy with stent removal     Additional orders: N/A  -Admit type: OUTpatient  -Anesthesia: MAC  -VTE Prophylaxis Standing Order SCD's       Other:   -Standing Lab Orders Per Anesthesia    Lab other: UA&Urine Culture was ordered 04/19/2022  -Standing Test orders EKG/Chest x-ray per Anesthesia       Test other:   - Medications:   Based on urine culture  -Other orders:  N/A

## 2022-04-24 ENCOUNTER — Telehealth: Payer: Self-pay

## 2022-04-24 LAB — CULTURE, URINE COMPREHENSIVE

## 2022-04-24 NOTE — Telephone Encounter (Signed)
I spoke with Heather Lopez. We have discussed possible surgery dates and Tuesday June 13th, 2023 was agreed upon by all parties. Patient given information about surgery date, what to expect pre-operatively and post operatively.  We discussed that a Pre-Admission Testing office will be calling to set up the pre-op visit that will take place prior to surgery, and that these appointments are typically done over the phone with a Pre-Admissions RN.  Informed patient that our office will communicate any additional care to be provided after surgery. Patients questions or concerns were discussed during our call. Advised to call our office should there be any additional information, questions or concerns that arise. Patient verbalized understanding.

## 2022-04-24 NOTE — Progress Notes (Signed)
[facility] Posting Form   Surgery Date/Time: Date: 05/08/2022  Surgeon: Dr. John Giovanni, MD  Surgery Location: Day Surgery  Inpt ( No  )   Outpt (Yes)   Obs ( No  )   Diagnosis: Right Hydronephrosis N13.30  -CPT: 52310  Surgery: Right Cystoscopy with Stent Removal  Stop Anticoagulations: No, may continue ASA  Cardiac/Medical/Pulmonary Clearance needed: no  *Orders entered into EPIC  Date: 04/24/22   *Case booked in EPIC  Date: 04/20/2022  *Notified pt of Surgery: Date: 04/20/2022  PRE-OP UA & CX: yes, obtained on 04/19/2022  *Placed into Prior Authorization Work Fabio Bering Date: 04/24/22   Assistant/laser/rep:No

## 2022-05-03 ENCOUNTER — Other Ambulatory Visit: Payer: Self-pay

## 2022-05-03 ENCOUNTER — Encounter
Admission: RE | Admit: 2022-05-03 | Discharge: 2022-05-03 | Disposition: A | Discharge status: Home / Self Care | Payer: Medicare Other | Source: Ambulatory Visit | Attending: Urology | Admitting: Urology

## 2022-05-03 VITALS — Ht 63.0 in | Wt 190.0 lb

## 2022-05-03 DIAGNOSIS — Z01818 Encounter for other preprocedural examination: Secondary | ICD-10-CM

## 2022-05-03 DIAGNOSIS — E1165 Type 2 diabetes mellitus with hyperglycemia: Secondary | ICD-10-CM

## 2022-05-03 NOTE — Patient Instructions (Addendum)
Your procedure is scheduled on: Tuesday 05/08/22 Report to the Registration Desk on the 1st floor of the [facility]. To find out your arrival time, please call [phone] between 1PM - 3PM on: Monday 05/07/22 If your arrival time is 6:00 am, do not arrive prior to that time as the [facility] entrance doors do not open until 6:00 am.  REMEMBER: Instructions that are not followed completely may result in serious medical risk, up to and including death; or upon the discretion of your surgeon and anesthesiologist your surgery may need to be rescheduled.  Do not eat or drink after midnight the night before surgery.  No gum chewing, lozengers or hard candies.  TAKE THESE MEDICATIONS THE MORNING OF SURGERY WITH A SIP OF WATER: buPROPion (WELLBUTRIN) 100 MG tablet busPIRone (BUSPAR) 5 MG tablet cloNIDine (CATAPRES) 0.1 MG tablet metoprolol tartrate (LOPRESSOR) 100 MG tablet  Hold your aspirin 81 MG EC tablet the day of surgery.  Take half of your insulin detemir (LEVEMIR) 100 UNIT/ML injection (30 units) which will be 15 units at bedtime the night prior to the surgery.  One week prior to surgery: Stop Anti-inflammatories (NSAIDS) such as Advil, Aleve, Ibuprofen, Motrin, Naproxen, Naprosyn and Aspirin based products such as Excedrin, Goodys Powder, BC Powder.  Stop taking your Multiple Vitamin (MULTIVITAMIN WITH MINERALS) TABS tablet, and ANY OVER THE COUNTER supplements until after surgery.  You may however, continue to take Tylenol if needed for pain up until the day of surgery.  No Alcohol for 24 hours before or after surgery.  No Smoking including e-cigarettes for 24 hours prior to surgery.  No chewable tobacco products for at least 6 hours prior to surgery.  No nicotine patches on the day of surgery.  Do not use any "recreational" drugs for at least a week prior to your surgery.  Please be advised that the combination of cocaine and anesthesia may have negative outcomes, up to  and including death. If you test positive for cocaine, your surgery will be cancelled.  On the morning of surgery brush your teeth with toothpaste and water, you may rinse your mouth with mouthwash if you wish. Do not swallow any toothpaste or mouthwash.  Do not wear jewelry, make-up, hairpins, clips or nail polish.  Do not wear lotions, powders, or perfumes.   Do not shave body from the neck down 48 hours prior to surgery just in case you cut yourself which could leave a site for infection.  Also, freshly shaved skin may become irritated if using the CHG soap.  Do not bring valuables to the hospital. [facility] is not responsible for any missing/lost belongings or valuables.   Notify your doctor if there is any change in your medical condition (cold, fever, infection).  Wear comfortable clothing (specific to your surgery type) to the hospital.  After surgery, you can help prevent lung complications by doing breathing exercises.  Take deep breaths and cough every 1-2 hours.   If you are being discharged the day of surgery, you will not be allowed to drive home. You will need a responsible adult (18 years or older) to drive you home and stay with you that night.   If you are taking public transportation, you will need to have a responsible adult (18 years or older) with you. Please confirm with your physician that it is acceptable to use public transportation.   Please call the [facility] at [phone] if you have any questions about these instructions.  Surgery Visitation Policy:  Patients undergoing a surgery or procedure may have two family members or support persons with them as long as the person is not COVID-19 positive or experiencing its symptoms.   Inpatient Visitation:    Visiting hours are 7 a.m. to 8 p.m. Up to four visitors are allowed at one time in a patient room, including children. The visitors may rotate out with other people during the  day. One designated support person (adult) may remain overnight.

## 2022-05-07 ENCOUNTER — Inpatient Hospital Stay: Admission: RE | Admit: 2022-05-07 | Payer: Medicare Other | Source: Ambulatory Visit

## 2022-05-08 ENCOUNTER — Encounter
Admission: RE | Disposition: A | Discharge status: Home / Self Care | Payer: Self-pay | Source: Home / Self Care | Attending: Urology

## 2022-05-08 ENCOUNTER — Ambulatory Visit
Admission: RE | Admit: 2022-05-08 | Discharge: 2022-05-08 | Disposition: A | Discharge status: Home / Self Care | Payer: Medicare Other | Attending: Urology | Admitting: Urology

## 2022-05-08 ENCOUNTER — Encounter: Payer: Self-pay | Admitting: Urology

## 2022-05-08 ENCOUNTER — Ambulatory Visit: Payer: Medicare Other | Admitting: Urgent Care

## 2022-05-08 DIAGNOSIS — Z960 Presence of urogenital implants: Secondary | ICD-10-CM | POA: Diagnosis not present

## 2022-05-08 DIAGNOSIS — N184 Chronic kidney disease, stage 4 (severe): Secondary | ICD-10-CM | POA: Diagnosis not present

## 2022-05-08 DIAGNOSIS — Z79899 Other long term (current) drug therapy: Secondary | ICD-10-CM | POA: Insufficient documentation

## 2022-05-08 DIAGNOSIS — Z87891 Personal history of nicotine dependence: Secondary | ICD-10-CM | POA: Diagnosis not present

## 2022-05-08 DIAGNOSIS — Z794 Long term (current) use of insulin: Secondary | ICD-10-CM | POA: Insufficient documentation

## 2022-05-08 DIAGNOSIS — Z01818 Encounter for other preprocedural examination: Secondary | ICD-10-CM

## 2022-05-08 DIAGNOSIS — E1165 Type 2 diabetes mellitus with hyperglycemia: Secondary | ICD-10-CM

## 2022-05-08 DIAGNOSIS — F32A Depression, unspecified: Secondary | ICD-10-CM | POA: Diagnosis not present

## 2022-05-08 DIAGNOSIS — N1330 Unspecified hydronephrosis: Secondary | ICD-10-CM

## 2022-05-08 DIAGNOSIS — I129 Hypertensive chronic kidney disease with stage 1 through stage 4 chronic kidney disease, or unspecified chronic kidney disease: Secondary | ICD-10-CM | POA: Insufficient documentation

## 2022-05-08 DIAGNOSIS — N1370 Vesicoureteral-reflux, unspecified: Secondary | ICD-10-CM | POA: Insufficient documentation

## 2022-05-08 DIAGNOSIS — Z7984 Long term (current) use of oral hypoglycemic drugs: Secondary | ICD-10-CM | POA: Insufficient documentation

## 2022-05-08 DIAGNOSIS — F419 Anxiety disorder, unspecified: Secondary | ICD-10-CM | POA: Diagnosis not present

## 2022-05-08 DIAGNOSIS — Z466 Encounter for fitting and adjustment of urinary device: Secondary | ICD-10-CM | POA: Insufficient documentation

## 2022-05-08 DIAGNOSIS — E1122 Type 2 diabetes mellitus with diabetic chronic kidney disease: Secondary | ICD-10-CM | POA: Diagnosis not present

## 2022-05-08 HISTORY — PX: CYSTOSCOPY W/ URETERAL STENT REMOVAL: SHX1430

## 2022-05-08 LAB — BASIC METABOLIC PANEL
Anion gap: 6 (ref 5–15)
BUN: 69 mg/dL — ABNORMAL HIGH (ref 8–23)
CO2: 22 mmol/L (ref 22–32)
Calcium: 9.4 mg/dL (ref 8.9–10.3)
Chloride: 107 mmol/L (ref 98–111)
Creatinine, Ser: 2.48 mg/dL — ABNORMAL HIGH (ref 0.44–1.00)
GFR, Estimated: 20 mL/min — ABNORMAL LOW (ref >60)
Glucose, Bld: 148 mg/dL — ABNORMAL HIGH (ref 70–99)
Potassium: 4.9 mmol/L (ref 3.5–5.1)
Sodium: 135 mmol/L (ref 135–145)

## 2022-05-08 LAB — GLUCOSE, CAPILLARY
Glucose-Capillary: 146 mg/dL — ABNORMAL HIGH (ref 70–99)
Glucose-Capillary: 146 mg/dL — ABNORMAL HIGH (ref 70–99)

## 2022-05-08 SURGERY — REMOVAL, STENT, URETER, CYSTOSCOPIC
Anesthesia: Monitor Anesthesia Care | Site: Ureter | Laterality: Right

## 2022-05-08 MED ORDER — CHLORHEXIDINE GLUCONATE 0.12 % MT SOLN: 15.0000 mL | Freq: Once | OROMUCOSAL | Status: AC

## 2022-05-08 MED ORDER — FAMOTIDINE 20 MG PO TABS
ORAL_TABLET | ORAL | Status: AC
  Administered 2022-05-08: 20 mg via ORAL
  Filled 2022-05-08: qty 1

## 2022-05-08 MED ORDER — OXYCODONE HCL 5 MG PO TABS: 5.0000 mg | ORAL_TABLET | Freq: Once | ORAL | Status: DC | PRN

## 2022-05-08 MED ORDER — FAMOTIDINE 20 MG PO TABS: 20.0000 mg | ORAL_TABLET | Freq: Once | ORAL | Status: AC

## 2022-05-08 MED ORDER — OXYCODONE HCL 5 MG/5ML PO SOLN: 5.0000 mg | Freq: Once | ORAL | Status: DC | PRN

## 2022-05-08 MED ORDER — FENTANYL CITRATE (PF) 100 MCG/2ML IJ SOLN
INTRAMUSCULAR | Status: AC
  Filled 2022-05-08: qty 2

## 2022-05-08 MED ORDER — SODIUM CHLORIDE 0.9 % IV SOLN
1.0000 g | Freq: Once | INTRAVENOUS | Status: AC
  Administered 2022-05-08: 1 g via INTRAVENOUS
  Filled 2022-05-08: qty 10

## 2022-05-08 MED ORDER — PROPOFOL 500 MG/50ML IV EMUL
INTRAVENOUS | Status: DC | PRN
  Administered 2022-05-08: 50 ug/kg/min via INTRAVENOUS

## 2022-05-08 MED ORDER — FENTANYL CITRATE (PF) 100 MCG/2ML IJ SOLN: 25.0000 ug | INTRAMUSCULAR | Status: DC | PRN

## 2022-05-08 MED ORDER — LACTATED RINGERS IV SOLN: INTRAVENOUS | Status: DC

## 2022-05-08 MED ORDER — KETAMINE HCL 10 MG/ML IJ SOLN
INTRAMUSCULAR | Status: DC | PRN
  Administered 2022-05-08: 15 mg via INTRAVENOUS

## 2022-05-08 MED ORDER — SODIUM CHLORIDE 0.9 % IR SOLN
Status: DC | PRN
  Administered 2022-05-08: 3000 mL via INTRAVESICAL

## 2022-05-08 MED ORDER — SODIUM CHLORIDE 0.9 % IV SOLN: INTRAVENOUS | Status: DC

## 2022-05-08 MED ORDER — STERILE WATER FOR IRRIGATION IR SOLN
Status: DC | PRN
  Administered 2022-05-08: 500 mL

## 2022-05-08 MED ORDER — MIDAZOLAM HCL 2 MG/2ML IJ SOLN
INTRAMUSCULAR | Status: DC | PRN
  Administered 2022-05-08: 2 mg via INTRAVENOUS

## 2022-05-08 MED ORDER — ACETAMINOPHEN 10 MG/ML IV SOLN: 1000.0000 mg | Freq: Once | INTRAVENOUS | Status: DC | PRN

## 2022-05-08 MED ORDER — ONDANSETRON HCL 4 MG/2ML IJ SOLN: 4.0000 mg | Freq: Once | INTRAMUSCULAR | Status: DC | PRN

## 2022-05-08 MED ORDER — ORAL CARE MOUTH RINSE: 15.0000 mL | Freq: Once | OROMUCOSAL | Status: AC

## 2022-05-08 MED ORDER — LACTATED RINGERS IV SOLN: INTRAVENOUS | Status: DC | PRN

## 2022-05-08 MED ORDER — MIDAZOLAM HCL 2 MG/2ML IJ SOLN
INTRAMUSCULAR | Status: AC
  Filled 2022-05-08: qty 2

## 2022-05-08 MED ORDER — PROPOFOL 1000 MG/100ML IV EMUL
INTRAVENOUS | Status: AC
  Filled 2022-05-08: qty 100

## 2022-05-08 MED ORDER — CHLORHEXIDINE GLUCONATE 0.12 % MT SOLN
OROMUCOSAL | Status: AC
  Administered 2022-05-08: 15 mL via OROMUCOSAL
  Filled 2022-05-08: qty 15

## 2022-05-08 MED ORDER — FENTANYL CITRATE (PF) 100 MCG/2ML IJ SOLN
INTRAMUSCULAR | Status: DC | PRN
Start: 2022-05-08 — End: 2022-05-08
  Administered 2022-05-08: 50 ug via INTRAVENOUS

## 2022-05-08 MED ORDER — KETAMINE HCL 50 MG/5ML IJ SOSY
PREFILLED_SYRINGE | INTRAMUSCULAR | Status: AC
  Filled 2022-05-08: qty 5

## 2022-05-08 SURGICAL SUPPLY — 14 items
BAG DRAIN CYSTO-URO LG1000N (MISCELLANEOUS) ×2 IMPLANT
GAUZE 4X4 16PLY ~~LOC~~+RFID DBL (SPONGE) ×3 IMPLANT
GLOVE SURG UNDER POLY LF SZ7.5 (GLOVE) ×2 IMPLANT
GOWN STRL REUS W/ TWL LRG LVL3 (GOWN DISPOSABLE) ×2 IMPLANT
GOWN STRL REUS W/ TWL XL LVL3 (GOWN DISPOSABLE) ×1 IMPLANT
GOWN STRL REUS W/TWL LRG LVL3 (GOWN DISPOSABLE) ×4
GOWN STRL REUS W/TWL XL LVL3 (GOWN DISPOSABLE) ×2
GUIDEWIRE STR DUAL SENSOR (WIRE) ×2 IMPLANT
IV NS IRRIG 3000ML ARTHROMATIC (IV SOLUTION) ×2 IMPLANT
MANIFOLD NEPTUNE II (INSTRUMENTS) ×2 IMPLANT
PACK CYSTO AR (MISCELLANEOUS) ×2 IMPLANT
SET CYSTO W/LG BORE CLAMP LF (SET/KITS/TRAYS/PACK) ×2 IMPLANT
SURGILUBE 2OZ TUBE FLIPTOP (MISCELLANEOUS) ×2 IMPLANT
WATER STERILE IRR 500ML POUR (IV SOLUTION) ×2 IMPLANT

## 2022-05-08 NOTE — H&P (Signed)
Urology H&P   History of Present Illness: Heather Lopez is a 76 y.o. with a history of vesicoureteral reflux and a an indwelling right ureteral stent.  She was scheduled for office removal however visualization was difficult and she was having significant discomfort.  She is scheduled for stent removal under sedation  Past Medical History:  Diagnosis Date   Anxiety    Cataract    Depression    Diabetes mellitus without complication ([place])    Gout    History of kidney stones    Hyperlipidemia    Hypertension    Vertigo     Past Surgical History:  Procedure Laterality Date   CATARACT EXTRACTION, BILATERAL Bilateral 2019   COLONOSCOPY WITH PROPOFOL N/A 08/19/2015   Procedure: COLONOSCOPY WITH PROPOFOL;  Surgeon: Lucilla Lame, MD;  Location: [place];  Service: Endoscopy;  Laterality: N/A;  diabetic - insulin   CYSTOSCOPY W/ RETROGRADES Bilateral 12/22/2019   Procedure: CYSTOSCOPY WITH RETROGRADE PYELOGRAM;  Surgeon: Abbie Sons, MD;  Location: [facility];  Service: Urology;  Laterality: Bilateral;   CYSTOSCOPY W/ URETERAL STENT PLACEMENT Bilateral 07/05/2021   Procedure: CYSTOSCOPY WITH RETROGRADE PYELOGRAM/BILATERAL URETERAL STENT PLACEMENT;  Surgeon: Abbie Sons, MD;  Location: [facility];  Service: Urology;  Laterality: Bilateral;   CYSTOSCOPY W/ URETERAL STENT PLACEMENT Bilateral 12/26/2021   Procedure: CYSTOSCOPY WITH STENT REMOVAL;  Surgeon: Abbie Sons, MD;  Location: [facility];  Service: Urology;  Laterality: Bilateral;   CYSTOSCOPY W/ URETERAL STENT PLACEMENT Right 03/13/2022   Procedure: CYSTOSCOPY WITH RETROGRADE PYELOGRAM/URETERAL STENT PLACEMENT;  Surgeon: Abbie Sons, MD;  Location: [facility];  Service: Urology;  Laterality: Right;   CYSTOSCOPY WITH BIOPSY N/A 12/22/2019   Procedure: CYSTOSCOPY WITH bladder BIOPSY;  Surgeon: Abbie Sons, MD;  Location: [facility];  Service: Urology;  Laterality: N/A;   CYSTOSCOPY WITH BIOPSY  03/13/2022   Procedure:  CYSTOSCOPY WITH BLADDER BIOPSY;  Surgeon: Abbie Sons, MD;  Location: [facility];  Service: Urology;;   CYSTOSCOPY WITH STENT PLACEMENT Right 12/22/2019   Procedure: CYSTOSCOPY WITH STENT PLACEMENT;  Surgeon: Abbie Sons, MD;  Location: [facility];  Service: Urology;  Laterality: Right;   CYSTOSCOPY WITH URETEROSCOPY Bilateral 12/22/2019   Procedure: CYSTOSCOPY WITH URETEROSCOPY;  Surgeon: Abbie Sons, MD;  Location: [facility];  Service: Urology;  Laterality: Bilateral;   CYSTOSCOPY/URETEROSCOPY/HOLMIUM LASER/STENT PLACEMENT Right 12/22/2019   Procedure: CYSTOSCOPY/URETEROSCOPY/HOLMIUM LASER/STENT PLACEMENT;  Surgeon: Abbie Sons, MD;  Location: [facility];  Service: Urology;  Laterality: Right;   CYSTOSCOPY/URETEROSCOPY/HOLMIUM LASER/STENT PLACEMENT Bilateral 12/08/2021   Procedure: CYSTOSCOPY/URETEROSCOPY/HOLMIUM LASER/STENT PLACEMENT;  Surgeon: [name], MD;  Location: [facility];  Service: Urology;  Laterality: Bilateral;   DIALYSIS/PERMA CATHETER INSERTION N/A 06/29/2021   Procedure: DIALYSIS/PERMA CATHETER INSERTION;  Surgeon: Algernon Huxley, MD;  Location: [place] [facility];  Service: Cardiovascular;  Laterality: N/A;   DIALYSIS/PERMA CATHETER INSERTION N/A 07/13/2021   Procedure: DIALYSIS/PERMA CATHETER INSERTION;  Surgeon: Algernon Huxley, MD;  Location: [place] [facility];  Service: Cardiovascular;  Laterality: N/A;   EYE SURGERY     IR GASTROSTOMY TUBE MOD SED  08/01/2021   LAPAROTOMY N/A 06/29/2021   Procedure: EXPLORATORY LAPAROTOMY WITH REPAIR OF DUODENAL PERFORATION;  Surgeon: [name], MD;  Location: [facility];  Service: General;  Laterality: N/A;   POLYPECTOMY  08/19/2015   Procedure: POLYPECTOMY;  Surgeon: Lucilla Lame, MD;  Location: [place];  Service: Endoscopy;;   [place]  Home Medications:  Current Meds  Medication Sig   Ascorbic Acid (VITAMIN C) 100 MG tablet Take 100 mg by mouth daily.   buPROPion (WELLBUTRIN) 100 MG tablet Take  100 mg by mouth daily.   busPIRone (BUSPAR) 5 MG tablet Take 5 mg by mouth daily.   cloNIDine (CATAPRES) 0.1 MG tablet Take 1 tablet (0.1 mg total) by mouth 2 (two) times daily.   fluticasone (FLONASE) 50 MCG/ACT nasal spray Place 2 sprays into both nostrils daily as needed for rhinitis.   insulin aspart (NOVOLOG) 100 UNIT/ML injection Inject 0-10 Units into the skin 3 (three) times daily before meals. Sliding Scale   insulin detemir (LEVEMIR) 100 UNIT/ML injection Inject 0.3 mLs (30 Units total) into the skin 2 (two) times daily.   iron polysaccharides (NIFEREX) 150 MG capsule Take 1 capsule (150 mg total) by mouth daily.   metoprolol tartrate (LOPRESSOR) 100 MG tablet Take 1 tablet (100 mg total) by mouth 2 (two) times daily.   Multiple Vitamin (MULTIVITAMIN WITH MINERALS) TABS tablet Take 1 tablet by mouth daily.   polyethylene glycol (MIRALAX / GLYCOLAX) 17 g packet Take 17 g by mouth daily as needed for mild constipation or moderate constipation.   sitaGLIPtin (JANUVIA) 25 MG tablet Place 1 tablet (25 mg total) into feeding tube daily. (Patient taking differently: Take 25 mg by mouth daily.)    Allergies:  Allergies  Allergen Reactions   Contrast Media  [Iodinated Contrast Media] Anaphylaxis   Iodine Swelling    (IV only) - angioedema    Family History  Problem Relation Age of Onset   Diabetes Father    Cancer Father        lung cancer   Diabetes Brother    Stroke Mother     Social History:  reports that she quit smoking about 50 years ago. Her smoking use included cigarettes. She has a 4.50 pack-year smoking history. She has never used smokeless tobacco. She reports that she does not drink alcohol and does not use drugs.  ROS: No fever, chills Physical Exam:  Vital signs in last 24 hours: Temp:  [96.9 F (36.1 C)] 96.9 F (36.1 C) (06/13 0850) Pulse Rate:  [65] 65 (06/13 0850) Resp:  [16] 16 (06/13 0850) BP: (99)/(88) 99/88 (06/13 0850) SpO2:  [100 %] 100 % (06/13  0850) Weight:  [86.2 kg] 86.2 kg (06/13 0850) Constitutional:  Alert and oriented, No acute distress HEENT: [place] AT, moist mucus membranes.  Trachea midline, no masses Cardiovascular: Regular rate and rhythm, no clubbing, cyanosis, or edema. Respiratory: Normal respiratory effort, lungs clear bilaterally    Laboratory Data:  No results for input(s): "WBC", "HGB", "HCT" in the last 72 hours. Recent Labs    05/08/22 0859  NA 135  K 4.9  CL 107  CO2 22  GLUCOSE 148*  BUN 69*  CREATININE 2.48*  CALCIUM 9.4   No results for input(s): "LABPT", "INR" in the last 72 hours. No results for input(s): "LABURIN" in the last 72 hours. Results for orders placed or performed in visit on 04/19/22  Microscopic Examination     Status: Abnormal   Collection Time: 04/19/22  3:02 PM   Urine  Result Value Ref Range Status   WBC, UA >30 (A) 0 - 5 /hpf Final   RBC 3-10 (A) 0 - 2 /hpf Final   Epithelial Cells (non renal) 0-10 0 - 10 /hpf Final   Bacteria, UA Few None seen/Few Final  CULTURE, URINE COMPREHENSIVE  Status: Abnormal   Collection Time: 04/19/22  3:37 PM   Specimen: Urine   UR  Result Value Ref Range Status   Urine Culture, Comprehensive Final report (A)  Final   Organism ID, Bacteria Klebsiella oxytoca (A)  Final    Comment: 2,000 Colonies/mL   ANTIMICROBIAL SUSCEPTIBILITY Comment  Final    Comment:       ** S = Susceptible; I = Intermediate; R = Resistant **                    P = Positive; N = Negative             MICS are expressed in micrograms per mL    Antibiotic                 RSLT#1    RSLT#2    RSLT#3    RSLT#4 Amoxicillin/Clavulanic Acid    S Ampicillin                     R Cefazolin                      R Cefepime                       S Ceftriaxone                    S Cefuroxime                     S Ciprofloxacin                  S Gentamicin                     S Imipenem                       S Levofloxacin                   S Meropenem                       S Nitrofurantoin                 S Tetracycline                   S Tobramycin                     S Trimethoprim/Sulfa             S      Impression/Assessment:  Indwelling right ureteral stent  Plan:  Cystoscopy/stent removal under sedation  05/08/2022, 10:43 AM  John Giovanni,  MD

## 2022-05-08 NOTE — Transfer of Care (Signed)
Immediate Anesthesia Transfer of Care Note  Patient: Heather Lopez  Procedure(s) Performed: CYSTOSCOPY WITH STENT REMOVAL (Right: Ureter)  Patient Location: PACU  Anesthesia Type:MAC  Level of Consciousness: drowsy  Airway & Oxygen Therapy: Patient Spontanous Breathing and Patient connected to face mask oxygen  Post-op Assessment: Report given to RN and Post -op Vital signs reviewed and stable  Post vital signs: Reviewed and stable  Last Vitals:  Vitals Value Taken Time  BP 169/69 05/08/22 1115  Temp 36.7 C 05/08/22 1107  Pulse 67 05/08/22 1116  Resp 18 05/08/22 1116  SpO2 100 % 05/08/22 1116  Vitals shown include unvalidated device data.  Last Pain:  Vitals:   05/08/22 1107  TempSrc:   PainSc: Asleep      Patients Stated Pain Goal: 3 (75/88/32 5498)  Complications: No notable events documented.

## 2022-05-08 NOTE — Anesthesia Postprocedure Evaluation (Signed)
Anesthesia Post Note  Patient: Heather Lopez  Procedure(s) Performed: CYSTOSCOPY WITH STENT REMOVAL (Right: Ureter)  Patient location during evaluation: PACU Anesthesia Type: MAC Level of consciousness: awake and alert Pain management: pain level controlled Vital Signs Assessment: post-procedure vital signs reviewed and stable Respiratory status: spontaneous breathing, nonlabored ventilation, respiratory function stable and patient connected to nasal cannula oxygen Cardiovascular status: stable and blood pressure returned to baseline Postop Assessment: no apparent nausea or vomiting Anesthetic complications: no   No notable events documented.   Last Vitals:  Vitals:   05/08/22 1200 05/08/22 1227  BP: (!) 170/73 (!) 147/61  Pulse: 60 70  Resp: 15 16  Temp: 36.8 C 36.5 C  SpO2: 97% 100%    Last Pain:  Vitals:   05/08/22 1227  TempSrc: Temporal  PainSc: 4                  Arita Miss

## 2022-05-08 NOTE — Discharge Instructions (Addendum)
Call [phone] for fever >101 degrees Hopefully urinary symptoms will improved with stent removal. If still having urinary symptoms post stent removal contact office and will have you pick up samples to try for your urinary symptoms You will be contacted by our office for a follow-up appointment [place]   The drugs that you were given will stay in your system until tomorrow so for the next 24 hours you should not:  Drive an automobile Make any legal decisions Drink any alcoholic beverage   You may resume regular meals tomorrow.  Today it is better to start with liquids and gradually work up to solid foods.  You may eat anything you prefer, but it is better to start with liquids, then soup and crackers, and gradually work up to solid foods.   Please notify your doctor immediately if you have any unusual bleeding, trouble breathing, redness and pain at the surgery site, drainage, fever, or pain not relieved by medication.    Additional Instructions:     Please contact your physician with any problems or Same Day Surgery at [phone], Monday through Friday 6 am to 4 pm, or [place] at [facility] number at [phone].

## 2022-05-08 NOTE — Anesthesia Preprocedure Evaluation (Signed)
Anesthesia Evaluation  Patient identified by MRN, date of birth, ID band Patient awake    Reviewed: Allergy & Precautions, NPO status , Patient's Chart, lab work & pertinent test results  History of Anesthesia Complications Negative for: history of anesthetic complications  Airway Mallampati: III   Neck ROM: Full    Dental  (+) Missing   Pulmonary neg pulmonary ROS, neg sleep apnea, neg COPD, Patient abstained from smoking.Not current smoker, former smoker,    Pulmonary exam normal breath sounds clear to auscultation       Cardiovascular Exercise Tolerance: Good METShypertension, Pt. on medications (-) CAD and (-) Past MI Normal cardiovascular exam(-) dysrhythmias  Rhythm:Regular Rate:Normal  ECG 12/03/21:  Sinus rhythm with short PR Minimal voltage criteria for LVH, may be normal variant ( R in aVL ) Anterior infarct , age undetermined   Neuro/Psych PSYCHIATRIC DISORDERS Anxiety Depression Vertigo  negative neurological ROS     GI/Hepatic negative GI ROS, neg GERD  ,(+)     (-) substance abuse  ,   Endo/Other  diabetes, Type 2, Insulin DependentObesity   Renal/GU Renal disease (stage IV CKD, nephrolithiasis)     Musculoskeletal Gout    Abdominal   Peds  Hematology  (+) Blood dyscrasia, anemia ,   Anesthesia Other Findings Past Medical History: No date: Anxiety No date: Cataract No date: Depression No date: Diabetes mellitus without complication (HCC) No date: Gout No date: History of kidney stones No date: Hyperlipidemia No date: Hypertension No date: Vertigo  Reproductive/Obstetrics                             Anesthesia Physical  Anesthesia Plan  ASA: 3  Anesthesia Plan: MAC   Post-op Pain Management: Minimal or no pain anticipated   Induction: Intravenous  PONV Risk Score and Plan: 3 and Propofol infusion, TIVA, Ondansetron and Midazolam  Airway Management Planned:  Nasal Cannula  Additional Equipment: None  Intra-op Plan:   Post-operative Plan:   Informed Consent: I have reviewed the patients History and Physical, chart, labs and discussed the procedure including the risks, benefits and alternatives for the proposed anesthesia with the patient or authorized representative who has indicated his/her understanding and acceptance.     Dental advisory given  Plan Discussed with: CRNA and Surgeon  Anesthesia Plan Comments: (Discussed risks of anesthesia with patient, including possibility of difficulty with spontaneous ventilation under anesthesia necessitating airway intervention, PONV, and rare risks such as cardiac or respiratory or neurological events, and allergic reactions. Discussed the role of CRNA in patient's perioperative care. Patient understands.)        Anesthesia Quick Evaluation

## 2022-05-15 NOTE — Op Note (Signed)
Preoperative diagnosis:  Indwelling right ureteral stent  Postoperative diagnosis:  Same  Procedure: Cystoscopy with stent removal  Surgeon: Abbie Sons, MD  Anesthesia: MAC  Complications: None  Intraoperative findings:  Erythematous bladder because of secondary to indwelling stent.  No solid or papillary lesions.  EBL: Minimal  Specimens: None  Indication: Heather Lopez is a 76 y.o. patient with a history of vesicoureteral reflux and an indwelling right ureteral stent.  Recent attempt at office removal unsuccessful due to suboptimal visualization and patient discomfort.  She presents for stent removal under anesthesia. Informed consent has been obtained.  Description of procedure:  The patient was taken to the operating room and sedation was obtained by anesthesia.  The patient was placed in the dorsal lithotomy position, prepped and draped in the usual sterile fashion, and preoperative antibiotics were administered. A preoperative time-out was performed.   A 21 French cystoscope sheath with obturator was lubricated and passed per urethra.  The bladder was emptied and slightly cloudy urine was noted.  After repeat fill and drain panendoscopy was performed with findings as described above.  The stent was grasped with endoscopic forceps and removed without difficulty.  She was then transported to the PACU in stable condition.   Abbie Sons, M.D.

## 2022-06-07 ENCOUNTER — Encounter: Payer: Self-pay | Admitting: Physician Assistant

## 2022-06-07 ENCOUNTER — Ambulatory Visit (INDEPENDENT_AMBULATORY_CARE_PROVIDER_SITE_OTHER): Payer: Medicare Other | Admitting: Physician Assistant

## 2022-06-07 VITALS — BP 139/75 | HR 63 | Ht 63.0 in | Wt 201.0 lb

## 2022-06-07 DIAGNOSIS — N1339 Other hydronephrosis: Secondary | ICD-10-CM | POA: Diagnosis not present

## 2022-06-07 DIAGNOSIS — N952 Postmenopausal atrophic vaginitis: Secondary | ICD-10-CM

## 2022-06-07 DIAGNOSIS — R350 Frequency of micturition: Secondary | ICD-10-CM

## 2022-06-07 MED ORDER — PREMARIN 0.625 MG/GM VA CREA: TOPICAL_CREAM | VAGINAL | 4 refills | Status: DC

## 2022-06-07 MED ORDER — GEMTESA 75 MG PO TABS: 75.0000 mg | ORAL_TABLET | Freq: Every day | ORAL | 0 refills | Status: DC

## 2022-06-07 NOTE — Patient Instructions (Signed)
Start Gemtesa once daily, take it about one hour prior to bedtime. Start topical vaginal estrogen cream (Premarin), one pea-sized amount at the opening of the urethra every day x2 weeks, then 3 times weekly. The imaging department will contact you to schedule your lasix renogram.

## 2022-06-07 NOTE — Progress Notes (Signed)
06/07/2022 5:18 PM   Heather Lopez Apr 26, 1946 [id number]  CC: Chief Complaint  Patient presents with   Follow-up   HPI: Heather Lopez is a 76 y.o. female with PMH controlled diabetes, CKD, nephrolithiasis, and chronic right hydroureteronephrosis with vesicoureteral reflux who presents today for postop follow-up after having her right ureteral stent removed by Dr. Bernardo Heater on 05/08/2022.   Today she reports bothersome urinary frequency without urgency or incontinence.  She has occasional burning with initiation of urination, none today.  She states she tolerated her ureteral stent well, but found attempted cystoscopy stent removal to be extremely painful.  Most recent creatinine dated 06/04/2022 was 3.3, previously 2.48 at the time of stent removal.  PMH: Past Medical History:  Diagnosis Date   Anxiety    Cataract    Depression    Diabetes mellitus without complication ([place])    Gout    History of kidney stones    Hyperlipidemia    Hypertension    Vertigo     Surgical History: Past Surgical History:  Procedure Laterality Date   CATARACT EXTRACTION, BILATERAL Bilateral 2019   COLONOSCOPY WITH PROPOFOL N/A 08/19/2015   Procedure: COLONOSCOPY WITH PROPOFOL;  Surgeon: Lucilla Lame, MD;  Location: [place];  Service: Endoscopy;  Laterality: N/A;  diabetic - insulin   CYSTOSCOPY W/ RETROGRADES Bilateral 12/22/2019   Procedure: CYSTOSCOPY WITH RETROGRADE PYELOGRAM;  Surgeon: Abbie Sons, MD;  Location: [facility];  Service: Urology;  Laterality: Bilateral;   CYSTOSCOPY W/ URETERAL STENT PLACEMENT Bilateral 07/05/2021   Procedure: CYSTOSCOPY WITH RETROGRADE PYELOGRAM/BILATERAL URETERAL STENT PLACEMENT;  Surgeon: Abbie Sons, MD;  Location: [facility];  Service: Urology;  Laterality: Bilateral;   CYSTOSCOPY W/ URETERAL STENT PLACEMENT Bilateral 12/26/2021   Procedure: CYSTOSCOPY WITH STENT REMOVAL;  Surgeon: Abbie Sons, MD;  Location: [facility];  Service: Urology;   Laterality: Bilateral;   CYSTOSCOPY W/ URETERAL STENT PLACEMENT Right 03/13/2022   Procedure: CYSTOSCOPY WITH RETROGRADE PYELOGRAM/URETERAL STENT PLACEMENT;  Surgeon: Abbie Sons, MD;  Location: [facility];  Service: Urology;  Laterality: Right;   CYSTOSCOPY W/ URETERAL STENT REMOVAL Right 05/08/2022   Procedure: CYSTOSCOPY WITH STENT REMOVAL;  Surgeon: Abbie Sons, MD;  Location: [facility];  Service: Urology;  Laterality: Right;   CYSTOSCOPY WITH BIOPSY N/A 12/22/2019   Procedure: CYSTOSCOPY WITH bladder BIOPSY;  Surgeon: Abbie Sons, MD;  Location: [facility];  Service: Urology;  Laterality: N/A;   CYSTOSCOPY WITH BIOPSY  03/13/2022   Procedure: CYSTOSCOPY WITH BLADDER BIOPSY;  Surgeon: Abbie Sons, MD;  Location: [facility];  Service: Urology;;   CYSTOSCOPY WITH STENT PLACEMENT Right 12/22/2019   Procedure: CYSTOSCOPY WITH STENT PLACEMENT;  Surgeon: Abbie Sons, MD;  Location: [facility];  Service: Urology;  Laterality: Right;   CYSTOSCOPY WITH URETEROSCOPY Bilateral 12/22/2019   Procedure: CYSTOSCOPY WITH URETEROSCOPY;  Surgeon: Abbie Sons, MD;  Location: [facility];  Service: Urology;  Laterality: Bilateral;   CYSTOSCOPY/URETEROSCOPY/HOLMIUM LASER/STENT PLACEMENT Right 12/22/2019   Procedure: CYSTOSCOPY/URETEROSCOPY/HOLMIUM LASER/STENT PLACEMENT;  Surgeon: Abbie Sons, MD;  Location: [facility];  Service: Urology;  Laterality: Right;   CYSTOSCOPY/URETEROSCOPY/HOLMIUM LASER/STENT PLACEMENT Bilateral 12/08/2021   Procedure: CYSTOSCOPY/URETEROSCOPY/HOLMIUM LASER/STENT PLACEMENT;  Surgeon: [name], MD;  Location: [facility];  Service: Urology;  Laterality: Bilateral;   DIALYSIS/PERMA CATHETER INSERTION N/A 06/29/2021   Procedure: DIALYSIS/PERMA CATHETER INSERTION;  Surgeon: Algernon Huxley, MD;  Location: [place] [facility];  Service: Cardiovascular;  Laterality: N/A;   DIALYSIS/PERMA CATHETER INSERTION  N/A 07/13/2021   Procedure: DIALYSIS/PERMA CATHETER INSERTION;  Surgeon:  Algernon Huxley, MD;  Location: [place] [facility];  Service: Cardiovascular;  Laterality: N/A;   EYE SURGERY     IR GASTROSTOMY TUBE MOD SED  08/01/2021   LAPAROTOMY N/A 06/29/2021   Procedure: EXPLORATORY LAPAROTOMY WITH REPAIR OF DUODENAL PERFORATION;  Surgeon: [name], MD;  Location: [facility];  Service: General;  Laterality: N/A;   POLYPECTOMY  08/19/2015   Procedure: POLYPECTOMY;  Surgeon: Lucilla Lame, MD;  Location: [place];  Service: Endoscopy;;   TUBAL LIGATION  1992    Home Medications:  Allergies as of 06/07/2022       Reactions   Contrast Media  [iodinated Contrast Media] Anaphylaxis   Iodine Swelling   (IV only) - angioedema        Medication List        Accurate as of June 07, 2022  5:18 PM. If you have any questions, ask your nurse or doctor.          STOP taking these medications    sitaGLIPtin 25 MG tablet Commonly known as: JANUVIA Stopped by: Debroah Loop, PA-C       TAKE these medications    aspirin EC 81 MG tablet Take by mouth.   buPROPion 100 MG tablet Commonly known as: WELLBUTRIN Take 100 mg by mouth daily.   busPIRone 5 MG tablet Commonly known as: BUSPAR Take 5 mg by mouth daily.   cloNIDine 0.1 MG tablet Commonly known as: CATAPRES Take 1 tablet (0.1 mg total) by mouth 2 (two) times daily.   fluticasone 50 MCG/ACT nasal spray Commonly known as: FLONASE Place 2 sprays into both nostrils daily as needed for rhinitis.   Gemtesa 75 MG Tabs Generic drug: Vibegron Take 75 mg by mouth daily. Started by: Debroah Loop, PA-C   insulin aspart 100 UNIT/ML injection Commonly known as: novoLOG Inject 0-10 Units into the skin 3 (three) times daily before meals. Sliding Scale   insulin detemir 100 UNIT/ML injection Commonly known as: LEVEMIR Inject 0.3 mLs (30 Units total) into the skin 2 (two) times daily.   iron polysaccharides 150 MG capsule Commonly known as: NIFEREX Take 1 capsule (150 mg total)  by mouth daily.   loperamide 2 MG tablet Commonly known as: IMODIUM A-D Take 2 mg by mouth every 6 (six) hours as needed for diarrhea or loose stools.   metoprolol tartrate 100 MG tablet Commonly known as: LOPRESSOR Take 1 tablet (100 mg total) by mouth 2 (two) times daily.   multivitamin with minerals Tabs tablet Take 1 tablet by mouth daily.   polyethylene glycol 17 g packet Commonly known as: MIRALAX / GLYCOLAX Take 17 g by mouth daily as needed for mild constipation or moderate constipation.   Premarin vaginal cream Generic drug: conjugated estrogens Apply one pea-sized amount around the opening of the urethra daily for 2 weeks, then 3 times weekly moving forward. Started by: Debroah Loop, PA-C   senna-docusate 8.6-50 MG tablet Commonly known as: Senokot-S Take 1 tablet by mouth at bedtime as needed for mild constipation or moderate constipation.   Trulicity 0.93 AT/5.5DD Sopn Generic drug: Dulaglutide Inject 0.75 mg into the skin once a week.   vitamin C 100 MG tablet Take 100 mg by mouth daily.        Allergies:  Allergies  Allergen Reactions   Contrast Media  [Iodinated Contrast Media] Anaphylaxis   Iodine Swelling    (IV only) - angioedema  Family History: Family History  Problem Relation Age of Onset   Diabetes Father    Cancer Father        lung cancer   Diabetes Brother    Stroke Mother     Social History:   reports that she quit smoking about 50 years ago. Her smoking use included cigarettes. She has a 4.50 pack-year smoking history. She has never used smokeless tobacco. She reports that she does not drink alcohol and does not use drugs.  Physical Exam: BP 139/75   Pulse 63   Ht '5\' 3"'$  (1.6 m)   Wt 201 lb (91.2 kg)   BMI 35.61 kg/m   Constitutional:  Alert and oriented, no acute distress, nontoxic appearing HEENT: [place], AT Cardiovascular: No clubbing, cyanosis, or edema Respiratory: Normal respiratory effort, no increased work of  breathing Skin: No rashes, bruises or suspicious lesions Neurologic: Grossly intact, no focal deficits, moving all 4 extremities Psychiatric: Normal mood and affect  Assessment & Plan:   1. Other hydronephrosis Rising creatinine following stent removal concerning for reflux as a contributor to her renal insufficiency.  We will obtain Lasix renogram for further evaluation.  We discussed the possibility of chronic indwelling ureteral stents and she was open to this. - [facility] Renal Imaging Flow W/Pharm; Future  2. Frequent urination We will attempt a trial of Gemtesa and plan for for symptom recheck and PVR in 4 weeks. - Vibegron (GEMTESA) 75 MG TABS; Take 75 mg by mouth daily.  Dispense: 28 tablet; Refill: 0  3. Atrophic vaginitis Intermittent dysuria with initiation consistent with likely atrophic vaginitis.  We discussed starting vaginal estrogen cream and she was in agreement.  We discussed that topical vaginal estrogen cream is not believed to be associated with systemic hormone absorption and so does not carry the risks for heart attack and stroke that oral estrogen replacement does. - conjugated estrogens (PREMARIN) vaginal cream; Apply one pea-sized amount around the opening of the urethra daily for 2 weeks, then 3 times weekly moving forward.  Dispense: 30 g; Refill: 4  Return in about 4 weeks (around 07/05/2022) for Symptom recheck, PVR, lasix renogram results with Dr. Bernardo Heater.  Debroah Loop, PA-C  [facility] [address], [place], [place] [address] [phone]

## 2022-06-07 NOTE — H&P (View-Only) (Signed)
06/07/2022 5:18 PM   Heather Lopez 1946-05-21 [id number]  CC: Chief Complaint  Patient presents with   Follow-up   HPI: Heather Lopez is a 76 y.o. female with PMH controlled diabetes, CKD, nephrolithiasis, and chronic right hydroureteronephrosis with vesicoureteral reflux who presents today for postop follow-up after having her right ureteral stent removed by Dr. Bernardo Heater on 05/08/2022.   Today she reports bothersome urinary frequency without urgency or incontinence.  She has occasional burning with initiation of urination, none today.  She states she tolerated her ureteral stent well, but found attempted cystoscopy stent removal to be extremely painful.  Most recent creatinine dated 06/04/2022 was 3.3, previously 2.48 at the time of stent removal.  PMH: Past Medical History:  Diagnosis Date   Anxiety    Cataract    Depression    Diabetes mellitus without complication ([place])    Gout    History of kidney stones    Hyperlipidemia    Hypertension    Vertigo     Surgical History: Past Surgical History:  Procedure Laterality Date   CATARACT EXTRACTION, BILATERAL Bilateral 2019   COLONOSCOPY WITH PROPOFOL N/A 08/19/2015   Procedure: COLONOSCOPY WITH PROPOFOL;  Surgeon: Lucilla Lame, MD;  Location: [place];  Service: Endoscopy;  Laterality: N/A;  diabetic - insulin   CYSTOSCOPY W/ RETROGRADES Bilateral 12/22/2019   Procedure: CYSTOSCOPY WITH RETROGRADE PYELOGRAM;  Surgeon: Abbie Sons, MD;  Location: [facility];  Service: Urology;  Laterality: Bilateral;   CYSTOSCOPY W/ URETERAL STENT PLACEMENT Bilateral 07/05/2021   Procedure: CYSTOSCOPY WITH RETROGRADE PYELOGRAM/BILATERAL URETERAL STENT PLACEMENT;  Surgeon: Abbie Sons, MD;  Location: [facility];  Service: Urology;  Laterality: Bilateral;   CYSTOSCOPY W/ URETERAL STENT PLACEMENT Bilateral 12/26/2021   Procedure: CYSTOSCOPY WITH STENT REMOVAL;  Surgeon: Abbie Sons, MD;  Location: [facility];  Service: Urology;   Laterality: Bilateral;   CYSTOSCOPY W/ URETERAL STENT PLACEMENT Right 03/13/2022   Procedure: CYSTOSCOPY WITH RETROGRADE PYELOGRAM/URETERAL STENT PLACEMENT;  Surgeon: Abbie Sons, MD;  Location: [facility];  Service: Urology;  Laterality: Right;   CYSTOSCOPY W/ URETERAL STENT REMOVAL Right 05/08/2022   Procedure: CYSTOSCOPY WITH STENT REMOVAL;  Surgeon: Abbie Sons, MD;  Location: [facility];  Service: Urology;  Laterality: Right;   CYSTOSCOPY WITH BIOPSY N/A 12/22/2019   Procedure: CYSTOSCOPY WITH bladder BIOPSY;  Surgeon: Abbie Sons, MD;  Location: [facility];  Service: Urology;  Laterality: N/A;   CYSTOSCOPY WITH BIOPSY  03/13/2022   Procedure: CYSTOSCOPY WITH BLADDER BIOPSY;  Surgeon: Abbie Sons, MD;  Location: [facility];  Service: Urology;;   CYSTOSCOPY WITH STENT PLACEMENT Right 12/22/2019   Procedure: CYSTOSCOPY WITH STENT PLACEMENT;  Surgeon: Abbie Sons, MD;  Location: [facility];  Service: Urology;  Laterality: Right;   CYSTOSCOPY WITH URETEROSCOPY Bilateral 12/22/2019   Procedure: CYSTOSCOPY WITH URETEROSCOPY;  Surgeon: Abbie Sons, MD;  Location: [facility];  Service: Urology;  Laterality: Bilateral;   CYSTOSCOPY/URETEROSCOPY/HOLMIUM LASER/STENT PLACEMENT Right 12/22/2019   Procedure: CYSTOSCOPY/URETEROSCOPY/HOLMIUM LASER/STENT PLACEMENT;  Surgeon: Abbie Sons, MD;  Location: [facility];  Service: Urology;  Laterality: Right;   CYSTOSCOPY/URETEROSCOPY/HOLMIUM LASER/STENT PLACEMENT Bilateral 12/08/2021   Procedure: CYSTOSCOPY/URETEROSCOPY/HOLMIUM LASER/STENT PLACEMENT;  Surgeon: [name], MD;  Location: [facility];  Service: Urology;  Laterality: Bilateral;   DIALYSIS/PERMA CATHETER INSERTION N/A 06/29/2021   Procedure: DIALYSIS/PERMA CATHETER INSERTION;  Surgeon: Algernon Huxley, MD;  Location: [place] [facility];  Service: Cardiovascular;  Laterality: N/A;   DIALYSIS/PERMA CATHETER INSERTION  N/A 07/13/2021   Procedure: DIALYSIS/PERMA CATHETER INSERTION;  Surgeon:  Algernon Huxley, MD;  Location: [place] [facility];  Service: Cardiovascular;  Laterality: N/A;   EYE SURGERY     IR GASTROSTOMY TUBE MOD SED  08/01/2021   LAPAROTOMY N/A 06/29/2021   Procedure: EXPLORATORY LAPAROTOMY WITH REPAIR OF DUODENAL PERFORATION;  Surgeon: [name], MD;  Location: [facility];  Service: General;  Laterality: N/A;   POLYPECTOMY  08/19/2015   Procedure: POLYPECTOMY;  Surgeon: Lucilla Lame, MD;  Location: [place];  Service: Endoscopy;;   TUBAL LIGATION  1992    Home Medications:  Allergies as of 06/07/2022       Reactions   Contrast Media  [iodinated Contrast Media] Anaphylaxis   Iodine Swelling   (IV only) - angioedema        Medication List        Accurate as of June 07, 2022  5:18 PM. If you have any questions, ask your nurse or doctor.          STOP taking these medications    sitaGLIPtin 25 MG tablet Commonly known as: JANUVIA Stopped by: Debroah Loop, PA-C       TAKE these medications    aspirin EC 81 MG tablet Take by mouth.   buPROPion 100 MG tablet Commonly known as: WELLBUTRIN Take 100 mg by mouth daily.   busPIRone 5 MG tablet Commonly known as: BUSPAR Take 5 mg by mouth daily.   cloNIDine 0.1 MG tablet Commonly known as: CATAPRES Take 1 tablet (0.1 mg total) by mouth 2 (two) times daily.   fluticasone 50 MCG/ACT nasal spray Commonly known as: FLONASE Place 2 sprays into both nostrils daily as needed for rhinitis.   Gemtesa 75 MG Tabs Generic drug: Vibegron Take 75 mg by mouth daily. Started by: Debroah Loop, PA-C   insulin aspart 100 UNIT/ML injection Commonly known as: novoLOG Inject 0-10 Units into the skin 3 (three) times daily before meals. Sliding Scale   insulin detemir 100 UNIT/ML injection Commonly known as: LEVEMIR Inject 0.3 mLs (30 Units total) into the skin 2 (two) times daily.   iron polysaccharides 150 MG capsule Commonly known as: NIFEREX Take 1 capsule (150 mg total)  by mouth daily.   loperamide 2 MG tablet Commonly known as: IMODIUM A-D Take 2 mg by mouth every 6 (six) hours as needed for diarrhea or loose stools.   metoprolol tartrate 100 MG tablet Commonly known as: LOPRESSOR Take 1 tablet (100 mg total) by mouth 2 (two) times daily.   multivitamin with minerals Tabs tablet Take 1 tablet by mouth daily.   polyethylene glycol 17 g packet Commonly known as: MIRALAX / GLYCOLAX Take 17 g by mouth daily as needed for mild constipation or moderate constipation.   Premarin vaginal cream Generic drug: conjugated estrogens Apply one pea-sized amount around the opening of the urethra daily for 2 weeks, then 3 times weekly moving forward. Started by: Debroah Loop, PA-C   senna-docusate 8.6-50 MG tablet Commonly known as: Senokot-S Take 1 tablet by mouth at bedtime as needed for mild constipation or moderate constipation.   Trulicity 0.98 JX/9.1YN Sopn Generic drug: Dulaglutide Inject 0.75 mg into the skin once a week.   vitamin C 100 MG tablet Take 100 mg by mouth daily.        Allergies:  Allergies  Allergen Reactions   Contrast Media  [Iodinated Contrast Media] Anaphylaxis   Iodine Swelling    (IV only) - angioedema  Family History: Family History  Problem Relation Age of Onset   Diabetes Father    Cancer Father        lung cancer   Diabetes Brother    Stroke Mother     Social History:   reports that she quit smoking about 50 years ago. Her smoking use included cigarettes. She has a 4.50 pack-year smoking history. She has never used smokeless tobacco. She reports that she does not drink alcohol and does not use drugs.  Physical Exam: BP 139/75   Pulse 63   Ht '5\' 3"'$  (1.6 m)   Wt 201 lb (91.2 kg)   BMI 35.61 kg/m   Constitutional:  Alert and oriented, no acute distress, nontoxic appearing HEENT: [place], AT Cardiovascular: No clubbing, cyanosis, or edema Respiratory: Normal respiratory effort, no increased work of  breathing Skin: No rashes, bruises or suspicious lesions Neurologic: Grossly intact, no focal deficits, moving all 4 extremities Psychiatric: Normal mood and affect  Assessment & Plan:   1. Other hydronephrosis Rising creatinine following stent removal concerning for reflux as a contributor to her renal insufficiency.  We will obtain Lasix renogram for further evaluation.  We discussed the possibility of chronic indwelling ureteral stents and she was open to this. - [place] Renal Imaging Flow W/Pharm; Future  2. Frequent urination We will attempt a trial of Gemtesa and plan for for symptom recheck and PVR in 4 weeks. - Vibegron (GEMTESA) 75 MG TABS; Take 75 mg by mouth daily.  Dispense: 28 tablet; Refill: 0  3. Atrophic vaginitis Intermittent dysuria with initiation consistent with likely atrophic vaginitis.  We discussed starting vaginal estrogen cream and she was in agreement.  We discussed that topical vaginal estrogen cream is not believed to be associated with systemic hormone absorption and so does not carry the risks for heart attack and stroke that oral estrogen replacement does. - conjugated estrogens (PREMARIN) vaginal cream; Apply one pea-sized amount around the opening of the urethra daily for 2 weeks, then 3 times weekly moving forward.  Dispense: 30 g; Refill: 4  Return in about 4 weeks (around 07/05/2022) for Symptom recheck, PVR, lasix renogram results with Dr. Bernardo Heater.  Debroah Loop, PA-C  [facility] [address] [place], [place] [address] [phone]

## 2022-06-15 ENCOUNTER — Telehealth (INDEPENDENT_AMBULATORY_CARE_PROVIDER_SITE_OTHER): Payer: Self-pay

## 2022-06-15 NOTE — Telephone Encounter (Signed)
I attempted to contact the patient to schedule a permcath insertion. A message was left for a return call.

## 2022-06-18 ENCOUNTER — Telehealth (INDEPENDENT_AMBULATORY_CARE_PROVIDER_SITE_OTHER): Payer: Self-pay

## 2022-06-18 NOTE — Telephone Encounter (Signed)
I attempted to contact the patient to schedule a permcath insertion, a message was left for a return call.

## 2022-06-20 ENCOUNTER — Ambulatory Visit (INDEPENDENT_AMBULATORY_CARE_PROVIDER_SITE_OTHER): Payer: Medicare Other | Admitting: Physician Assistant

## 2022-06-20 ENCOUNTER — Encounter: Payer: Self-pay | Admitting: Physician Assistant

## 2022-06-20 ENCOUNTER — Telehealth (INDEPENDENT_AMBULATORY_CARE_PROVIDER_SITE_OTHER): Payer: Self-pay

## 2022-06-20 VITALS — BP 138/78 | HR 73 | Ht 63.0 in

## 2022-06-20 DIAGNOSIS — N1330 Unspecified hydronephrosis: Secondary | ICD-10-CM

## 2022-06-20 NOTE — Telephone Encounter (Signed)
I attempted to contact the patient to schedule her for a permcath insertion. A message was left for a return call.

## 2022-06-20 NOTE — Progress Notes (Signed)
06/20/2022 4:17 PM   Heather Lopez Feb 17, 1946 [id number]  CC: Chief Complaint  Patient presents with   Nephrolithiasis    HPI: Heather Lopez is a 76 y.o. female with PMH uncontrolled diabetes, CKD, nephrolithiasis, and chronic right hydroureteronephrosis with vesicoureteral reflux scheduled for Lasix renogram tomorrow who presents today to discuss questions regarding her upcoming scan.   Today she asks me to reiterate the purpose of tomorrow's scan and how the results will be managed.  She was recently recommended to start hemodialysis by nephrology and is curious if there is anything that can be done from the urology standpoint to optimize her renal function.  PMH: Past Medical History:  Diagnosis Date   Anxiety    Cataract    Depression    Diabetes mellitus without complication ([place])    Gout    History of kidney stones    Hyperlipidemia    Hypertension    Vertigo     Surgical History: Past Surgical History:  Procedure Laterality Date   CATARACT EXTRACTION, BILATERAL Bilateral 2019   COLONOSCOPY WITH PROPOFOL N/A 08/19/2015   Procedure: COLONOSCOPY WITH PROPOFOL;  Surgeon: Lucilla Lame, MD;  Location: [facility];  Service: Endoscopy;  Laterality: N/A;  diabetic - insulin   CYSTOSCOPY W/ RETROGRADES Bilateral 12/22/2019   Procedure: CYSTOSCOPY WITH RETROGRADE PYELOGRAM;  Surgeon: Abbie Sons, MD;  Location: [facility];  Service: Urology;  Laterality: Bilateral;   CYSTOSCOPY W/ URETERAL STENT PLACEMENT Bilateral 07/05/2021   Procedure: CYSTOSCOPY WITH RETROGRADE PYELOGRAM/BILATERAL URETERAL STENT PLACEMENT;  Surgeon: Abbie Sons, MD;  Location: [facility];  Service: Urology;  Laterality: Bilateral;   CYSTOSCOPY W/ URETERAL STENT PLACEMENT Bilateral 12/26/2021   Procedure: CYSTOSCOPY WITH STENT REMOVAL;  Surgeon: Abbie Sons, MD;  Location: [facility];  Service: Urology;  Laterality: Bilateral;   CYSTOSCOPY W/ URETERAL STENT PLACEMENT Right 03/13/2022   Procedure:  CYSTOSCOPY WITH RETROGRADE PYELOGRAM/URETERAL STENT PLACEMENT;  Surgeon: Abbie Sons, MD;  Location: [facility];  Service: Urology;  Laterality: Right;   CYSTOSCOPY W/ URETERAL STENT REMOVAL Right 05/08/2022   Procedure: CYSTOSCOPY WITH STENT REMOVAL;  Surgeon: Abbie Sons, MD;  Location: [facility];  Service: Urology;  Laterality: Right;   CYSTOSCOPY WITH BIOPSY N/A 12/22/2019   Procedure: CYSTOSCOPY WITH bladder BIOPSY;  Surgeon: Abbie Sons, MD;  Location: [facility];  Service: Urology;  Laterality: N/A;   CYSTOSCOPY WITH BIOPSY  03/13/2022   Procedure: CYSTOSCOPY WITH BLADDER BIOPSY;  Surgeon: Abbie Sons, MD;  Location: [facility];  Service: Urology;;   CYSTOSCOPY WITH STENT PLACEMENT Right 12/22/2019   Procedure: CYSTOSCOPY WITH STENT PLACEMENT;  Surgeon: Abbie Sons, MD;  Location: [facility];  Service: Urology;  Laterality: Right;   CYSTOSCOPY WITH URETEROSCOPY Bilateral 12/22/2019   Procedure: CYSTOSCOPY WITH URETEROSCOPY;  Surgeon: Abbie Sons, MD;  Location: [facility];  Service: Urology;  Laterality: Bilateral;   CYSTOSCOPY/URETEROSCOPY/HOLMIUM LASER/STENT PLACEMENT Right 12/22/2019   Procedure: CYSTOSCOPY/URETEROSCOPY/HOLMIUM LASER/STENT PLACEMENT;  Surgeon: Abbie Sons, MD;  Location: [facility];  Service: Urology;  Laterality: Right;   CYSTOSCOPY/URETEROSCOPY/HOLMIUM LASER/STENT PLACEMENT Bilateral 12/08/2021   Procedure: CYSTOSCOPY/URETEROSCOPY/HOLMIUM LASER/STENT PLACEMENT;  Surgeon: [name], MD;  Location: [facility];  Service: Urology;  Laterality: Bilateral;   DIALYSIS/PERMA CATHETER INSERTION N/A 06/29/2021   Procedure: DIALYSIS/PERMA CATHETER INSERTION;  Surgeon: Algernon Huxley, MD;  Location: [facility];  Service: Cardiovascular;  Laterality: N/A;   DIALYSIS/PERMA CATHETER INSERTION N/A 07/13/2021   Procedure: DIALYSIS/PERMA CATHETER INSERTION;  Surgeon:  Algernon Huxley, MD;  Location: [facility] [place];  Service: Cardiovascular;  Laterality: N/A;    EYE SURGERY     IR GASTROSTOMY TUBE MOD SED  08/01/2021   LAPAROTOMY N/A 06/29/2021   Procedure: EXPLORATORY LAPAROTOMY WITH REPAIR OF DUODENAL PERFORATION;  Surgeon: [name], MD;  Location: [facility];  Service: General;  Laterality: N/A;   POLYPECTOMY  08/19/2015   Procedure: POLYPECTOMY;  Surgeon: Lucilla Lame, MD;  Location: [place];  Service: Endoscopy;;   TUBAL LIGATION  1992    Home Medications:  Allergies as of 06/20/2022       Reactions   Contrast Media  [iodinated Contrast Media] Anaphylaxis   Iodine Swelling   (IV only) - angioedema        Medication List        Accurate as of June 20, 2022  4:17 PM. If you have any questions, ask your nurse or doctor.          aspirin EC 81 MG tablet Take by mouth.   buPROPion 100 MG tablet Commonly known as: WELLBUTRIN Take 100 mg by mouth daily.   busPIRone 5 MG tablet Commonly known as: BUSPAR Take 5 mg by mouth daily.   cloNIDine 0.1 MG tablet Commonly known as: CATAPRES Take 1 tablet (0.1 mg total) by mouth 2 (two) times daily.   fluticasone 50 MCG/ACT nasal spray Commonly known as: FLONASE Place 2 sprays into both nostrils daily as needed for rhinitis.   Gemtesa 75 MG Tabs Generic drug: Vibegron Take 75 mg by mouth daily.   insulin aspart 100 UNIT/ML injection Commonly known as: novoLOG Inject 0-10 Units into the skin 3 (three) times daily before meals. Sliding Scale   insulin detemir 100 UNIT/ML injection Commonly known as: LEVEMIR Inject 0.3 mLs (30 Units total) into the skin 2 (two) times daily.   iron polysaccharides 150 MG capsule Commonly known as: NIFEREX Take 1 capsule (150 mg total) by mouth daily.   loperamide 2 MG tablet Commonly known as: IMODIUM A-D Take 2 mg by mouth every 6 (six) hours as needed for diarrhea or loose stools.   metoprolol tartrate 100 MG tablet Commonly known as: LOPRESSOR Take 1 tablet (100 mg total) by mouth 2 (two) times daily.   multivitamin with  minerals Tabs tablet Take 1 tablet by mouth daily.   polyethylene glycol 17 g packet Commonly known as: MIRALAX / GLYCOLAX Take 17 g by mouth daily as needed for mild constipation or moderate constipation.   Premarin vaginal cream Generic drug: conjugated estrogens Apply one pea-sized amount around the opening of the urethra daily for 2 weeks, then 3 times weekly moving forward.   senna-docusate 8.6-50 MG tablet Commonly known as: Senokot-S Take 1 tablet by mouth at bedtime as needed for mild constipation or moderate constipation.   Trulicity 8.41 YS/0.6TK Sopn Generic drug: Dulaglutide Inject 0.75 mg into the skin once a week.   vitamin C 100 MG tablet Take 100 mg by mouth daily.        Allergies:  Allergies  Allergen Reactions   Contrast Media  [Iodinated Contrast Media] Anaphylaxis   Iodine Swelling    (IV only) - angioedema    Family History: Family History  Problem Relation Age of Onset   Diabetes Father    Cancer Father        lung cancer   Diabetes Brother    Stroke Mother     Social History:   reports that she quit smoking  about 50 years ago. Her smoking use included cigarettes. She has a 4.50 pack-year smoking history. She has never used smokeless tobacco. She reports that she does not drink alcohol and does not use drugs.  Physical Exam: BP 138/78   Pulse 73   Ht '5\' 3"'$  (1.6 m)   BMI 35.61 kg/m   Constitutional:  Alert and oriented, no acute distress, nontoxic appearing HEENT: [place], AT Cardiovascular: No clubbing, cyanosis, or edema Respiratory: Normal respiratory effort, no increased work of breathing Skin: No rashes, bruises or suspicious lesions Neurologic: Grossly intact, no focal deficits, moving all 4 extremities Psychiatric: Normal mood and affect  Assessment & Plan:   1. Hydronephrosis of right kidney We discussed that the point of the Lasix renal scan tomorrow is to determine if her right hydronephrosis is obstructive or nonobstructive.   If obstructive, she could be managed by a chronic indwelling ureteral stent.  If nonobstructive, no stent is needed.  We discussed that in the case of an obstructive hydronephrosis, placement of a ureteral stent to relieve the obstruction may improve her renal function somewhat, but I expect she will still have an element of underlying CKD.  Otherwise, in the case of a nonobstructive hydronephrosis, there is nothing else to do from the urologic perspective to optimize her renal function.  She expressed understanding.  Return if symptoms worsen or fail to improve.  Debroah Loop, PA-C  [facility] [address], [place], [place] [address] [phone]

## 2022-06-21 ENCOUNTER — Ambulatory Visit
Admission: RE | Admit: 2022-06-21 | Discharge: 2022-06-21 | Disposition: A | Discharge status: Home / Self Care | Payer: Medicare Other | Source: Ambulatory Visit | Attending: Physician Assistant | Admitting: Physician Assistant

## 2022-06-21 DIAGNOSIS — N1339 Other hydronephrosis: Secondary | ICD-10-CM | POA: Diagnosis not present

## 2022-06-21 MED ORDER — FUROSEMIDE 10 MG/ML IJ SOLN
46.0000 mg | Freq: Once | INTRAMUSCULAR | Status: AC
  Administered 2022-06-21: 46 mg via INTRAVENOUS
  Filled 2022-06-21: qty 4.6

## 2022-06-21 MED ORDER — TECHNETIUM TC 99M MERTIATIDE
5.0000 | Freq: Once | INTRAVENOUS | Status: AC | PRN
  Administered 2022-06-21: 5.1 via INTRAVENOUS

## 2022-06-22 ENCOUNTER — Other Ambulatory Visit: Payer: Self-pay | Admitting: Physician Assistant

## 2022-06-22 DIAGNOSIS — N1330 Unspecified hydronephrosis: Secondary | ICD-10-CM

## 2022-06-22 NOTE — Progress Notes (Unsigned)
Surgical Physician Order Form [facility]  * Scheduling expectation : Next Available  *Length of Case:   *Clearance needed: no  *Anticoagulation Instructions: N/A  *Aspirin Instructions: Ok to continue Aspirin  *Post-op visit Date/Instructions:  1 month follow up  *Diagnosis: Right Hydronephrosis  *Procedure: right Cysto w/stent placement (49971)   Additional orders: N/A  -Admit type: OUTpatient  -Anesthesia: General  -VTE Prophylaxis Standing Order SCD's       Other:   -Standing Lab Orders Per Anesthesia    Lab other: UA&Urine Culture  -Standing Test orders EKG/Chest x-ray per Anesthesia       Test other:   - Medications:  Ancef 2gm IV  -Other orders:  Fleets enema AM

## 2022-06-25 ENCOUNTER — Telehealth: Payer: Self-pay

## 2022-06-25 DIAGNOSIS — N1330 Unspecified hydronephrosis: Secondary | ICD-10-CM

## 2022-06-25 NOTE — Progress Notes (Signed)
[facility] Posting Form   Surgery Date/Time: Date: 07/03/2022  Surgeon: Dr. John Giovanni, MD  Surgery Location: Day Surgery  Inpt ( No  )   Outpt (Yes)   Obs ( No  )   Diagnosis: N13.30 Right Hydronephrosis  -CPT: 84033  Surgery: Right Cystoscopy with Stent Placement  Stop Anticoagulations: No, may continue ASA  Cardiac/Medical/Pulmonary Clearance needed: no  *Orders entered into EPIC  Date: 06/25/22   *Case booked in EPIC  Date: 06/25/22  *Notified pt of Surgery: Date: 06/25/22  PRE-OP UA & CX: yes, will obtain in clinic on 06/26/2022  *Placed into Prior Authorization Work Fabio Bering Date: 06/25/22   Assistant/laser/rep:No

## 2022-06-25 NOTE — Telephone Encounter (Signed)
-----   Message from Debroah Loop, [place] sent at 06/22/2022  3:41 PM EDT ----- Lasix renogram shows her right hydronephrosis is obstructive. I recommend chronic right ureteral stent, as discussed in clinic. OR orders placed, please notify patient.

## 2022-06-25 NOTE — Telephone Encounter (Signed)
Spoke with pt. Pt. Advised and we have added her to the OR schedule for next week with Dr. Bernardo Heater, MD.

## 2022-06-25 NOTE — Telephone Encounter (Signed)
I spoke with Heather Lopez. We have discussed possible surgery dates and Tuesday August 8th, 2023 was agreed upon by all parties. Patient given information about surgery date, what to expect pre-operatively and post operatively.  We discussed that a Pre-Admission Testing office will be calling to set up the pre-op visit that will take place prior to surgery, and that these appointments are typically done over the phone with a Pre-Admissions RN.  Informed patient that our office will communicate any additional care to be provided after surgery. Patients questions or concerns were discussed during our call. Advised to call our office should there be any additional information, questions or concerns that arise. Patient verbalized understanding.

## 2022-06-26 ENCOUNTER — Other Ambulatory Visit: Payer: Medicare Other

## 2022-06-26 DIAGNOSIS — N1330 Unspecified hydronephrosis: Secondary | ICD-10-CM

## 2022-06-26 LAB — MICROSCOPIC EXAMINATION
RBC, Urine: >30 /hpf — AB (ref 0–2)
WBC, UA: >30 /hpf — AB (ref 0–5)

## 2022-06-26 LAB — URINALYSIS, COMPLETE
Bilirubin, UA: NEGATIVE
Glucose, UA: NEGATIVE
Ketones, UA: NEGATIVE
Nitrite, UA: POSITIVE — AB
Specific Gravity, UA: 1.02 (ref 1.005–1.030)
Urobilinogen, Ur: 0.2 mg/dL (ref 0.2–1.0)
pH, UA: 6 (ref 5.0–7.5)

## 2022-06-27 ENCOUNTER — Encounter
Admission: RE | Admit: 2022-06-27 | Discharge: 2022-06-27 | Disposition: A | Discharge status: Home / Self Care | Payer: Medicare Other | Source: Ambulatory Visit | Attending: Urology | Admitting: Urology

## 2022-06-27 VITALS — Ht 63.0 in | Wt 201.0 lb

## 2022-06-27 DIAGNOSIS — Z01812 Encounter for preprocedural laboratory examination: Secondary | ICD-10-CM

## 2022-06-27 DIAGNOSIS — I10 Essential (primary) hypertension: Secondary | ICD-10-CM

## 2022-06-27 DIAGNOSIS — I129 Hypertensive chronic kidney disease with stage 1 through stage 4 chronic kidney disease, or unspecified chronic kidney disease: Secondary | ICD-10-CM

## 2022-06-27 DIAGNOSIS — E1165 Type 2 diabetes mellitus with hyperglycemia: Secondary | ICD-10-CM

## 2022-06-27 DIAGNOSIS — E875 Hyperkalemia: Secondary | ICD-10-CM

## 2022-06-27 DIAGNOSIS — E871 Hypo-osmolality and hyponatremia: Secondary | ICD-10-CM

## 2022-06-27 DIAGNOSIS — N183 Chronic kidney disease, stage 3 unspecified: Secondary | ICD-10-CM

## 2022-06-27 HISTORY — DX: Atherosclerosis of aorta: I70.0

## 2022-06-27 HISTORY — DX: Type 2 diabetes mellitus with diabetic chronic kidney disease: E11.22

## 2022-06-27 HISTORY — DX: Type 2 diabetes mellitus with diabetic chronic kidney disease: N18.5

## 2022-06-27 HISTORY — DX: Unspecified osteoarthritis, unspecified site: M19.90

## 2022-06-27 HISTORY — DX: Obesity, unspecified: E66.9

## 2022-06-27 NOTE — Patient Instructions (Addendum)
Your procedure is scheduled on: Tuesday, August 8 Report to the Registration Desk on the 1st floor of the [facility]. To find out your arrival time, please call [phone] between 1PM - 3PM on: Monday, August 7 If your arrival time is 6:00 am, do not arrive prior to that time as the [facility] entrance doors do not open until 6:00 am.  REMEMBER: Instructions that are not followed completely may result in serious medical risk, up to and including death; or upon the discretion of your surgeon and anesthesiologist your surgery may need to be rescheduled.  Do not eat or drink after midnight the night before surgery.  No gum chewing, lozengers or hard candies.  TAKE THESE MEDICATIONS THE MORNING OF SURGERY WITH A SIP OF WATER: (if you normally take these in the morning)  Atorvastatin Bupropion Buspirone Clonidine Lansoprazole (Prevacid) - (take one the night before and one on the morning of surgery - helps to prevent nausea after surgery.) Metoprolol  Trulicity - hold for 7 days PRIOR to surgery. Do NOT take the dose on Sunday, August 6. You may start back on your normal day (Sunday) AFTER surgery.  Levemir - only take 1/2 (half) of your dose the night before surgery (Monday, August 8); only take 15 units that night.  Do NOT take ANY insulin on the morning of surgery.  Follow recommendations from Cardiologist, Pulmonologist or PCP regarding stopping Aspirin.  According to Dr. Dene Gentry note; you may continue taking your aspirin.   One week prior to surgery: starting today, August 2 Stop Anti-inflammatories (NSAIDS) such as Advil, Aleve, Ibuprofen, Motrin, Naproxen, Naprosyn and Aspirin based products such as Excedrin, Goodys Powder, BC Powder. Stop ANY OVER THE COUNTER supplements until after surgery. Stop multiple vitamins. You may however, continue to take Tylenol if needed for pain up until the day of surgery.  No Alcohol for 24 hours before or after surgery.  No Smoking  including e-cigarettes for 24 hours prior to surgery.  No chewable tobacco products for at least 6 hours prior to surgery.  No nicotine patches on the day of surgery.  Do not use any "recreational" drugs for at least a week prior to your surgery.  Please be advised that the combination of cocaine and anesthesia may have negative outcomes, up to and including death. If you test positive for cocaine, your surgery will be cancelled.  On the morning of surgery brush your teeth with toothpaste and water, you may rinse your mouth with mouthwash if you wish. Do not swallow any toothpaste or mouthwash.  Do not wear jewelry, make-up, hairpins, clips or nail polish.  Do not wear lotions, powders, or perfumes.   Do not shave body from the neck down 48 hours prior to surgery just in case you cut yourself which could leave a site for infection.   Contact lenses, hearing aids and dentures may not be worn into surgery.  Do not bring valuables to the hospital. [facility] is not responsible for any missing/lost belongings or valuables.   Notify your doctor if there is any change in your medical condition (cold, fever, infection).  Wear comfortable clothing (specific to your surgery type) to the hospital.  After surgery, you can help prevent lung complications by doing breathing exercises.  Take deep breaths and cough every 1-2 hours. Your doctor may order a device called an Incentive Spirometer to help you take deep breaths.  If you are being discharged the day of surgery, you will not be  allowed to drive home. You will need a responsible adult (18 years or older) to drive you home and stay with you that night.   If you are taking public transportation, you will need to have a responsible adult (18 years or older) with you. Please confirm with your physician that it is acceptable to use public transportation.   Please call the [facility] Dept. at [phone] if you have any questions  about these instructions.  Surgery Visitation Policy:  Patients undergoing a surgery or procedure may have two family members or support persons with them as long as the person is not COVID-19 positive or experiencing its symptoms.

## 2022-06-28 ENCOUNTER — Encounter
Admission: RE | Admit: 2022-06-28 | Discharge: 2022-06-28 | Disposition: A | Discharge status: Home / Self Care | Payer: Medicare Other | Source: Ambulatory Visit | Attending: Urology | Admitting: Urology

## 2022-06-28 DIAGNOSIS — E875 Hyperkalemia: Secondary | ICD-10-CM

## 2022-06-28 DIAGNOSIS — I129 Hypertensive chronic kidney disease with stage 1 through stage 4 chronic kidney disease, or unspecified chronic kidney disease: Secondary | ICD-10-CM | POA: Insufficient documentation

## 2022-06-28 DIAGNOSIS — Z01812 Encounter for preprocedural laboratory examination: Secondary | ICD-10-CM | POA: Diagnosis present

## 2022-06-28 DIAGNOSIS — E1165 Type 2 diabetes mellitus with hyperglycemia: Secondary | ICD-10-CM | POA: Insufficient documentation

## 2022-06-28 DIAGNOSIS — I10 Essential (primary) hypertension: Secondary | ICD-10-CM | POA: Insufficient documentation

## 2022-06-28 DIAGNOSIS — E785 Hyperlipidemia, unspecified: Secondary | ICD-10-CM | POA: Diagnosis not present

## 2022-06-28 DIAGNOSIS — E871 Hypo-osmolality and hyponatremia: Secondary | ICD-10-CM | POA: Insufficient documentation

## 2022-06-28 LAB — CBC
HCT: 28.5 % — ABNORMAL LOW (ref 36.0–46.0)
Hemoglobin: 9 g/dL — ABNORMAL LOW (ref 12.0–15.0)
MCH: 28.5 pg (ref 26.0–34.0)
MCHC: 31.6 g/dL (ref 30.0–36.0)
MCV: 90.2 fL (ref 80.0–100.0)
Platelets: 397 10*3/uL (ref 150–400)
RBC: 3.16 MIL/uL — ABNORMAL LOW (ref 3.87–5.11)
RDW: 15 % (ref 11.5–15.5)
WBC: 7.7 10*3/uL (ref 4.0–10.5)
nRBC: 0 % (ref 0.0–0.2)

## 2022-06-28 LAB — BASIC METABOLIC PANEL
Anion gap: 11 (ref 5–15)
BUN: 76 mg/dL — ABNORMAL HIGH (ref 8–23)
CO2: 20 mmol/L — ABNORMAL LOW (ref 22–32)
Calcium: 9.7 mg/dL (ref 8.9–10.3)
Chloride: 103 mmol/L (ref 98–111)
Creatinine, Ser: 3.65 mg/dL — ABNORMAL HIGH (ref 0.44–1.00)
GFR, Estimated: 13 mL/min — ABNORMAL LOW (ref >60)
Glucose, Bld: 155 mg/dL — ABNORMAL HIGH (ref 70–99)
Potassium: 4.7 mmol/L (ref 3.5–5.1)
Sodium: 134 mmol/L — ABNORMAL LOW (ref 135–145)

## 2022-06-29 ENCOUNTER — Other Ambulatory Visit: Payer: Self-pay | Admitting: Physician Assistant

## 2022-06-29 DIAGNOSIS — N1330 Unspecified hydronephrosis: Secondary | ICD-10-CM

## 2022-06-29 LAB — CULTURE, URINE COMPREHENSIVE

## 2022-06-29 MED ORDER — SULFAMETHOXAZOLE-TRIMETHOPRIM 400-80 MG PO TABS: 1.0000 | ORAL_TABLET | Freq: Two times a day (BID) | ORAL | 0 refills | Status: AC

## 2022-06-29 NOTE — Progress Notes (Signed)
  Perioperative Services Pre-Admission/Anesthesia Testing    Date: 06/29/22  Name: Heather Lopez MRN:   [id number]  Re: GLP-1 clearance and provider recommendations   Planned Surgical Procedure(s):    Case: [id number] Date/Time: 07/03/22 0715   Procedure: CYSTOSCOPY WITH STENT PLACEMENT (Right)   Anesthesia type: Choice   Pre-op diagnosis: Right Hydronephrosis   Location: [facility] OR ROOM 10 / [facility]   Surgeons: Abbie Sons, MD   Clinical Notes:  Patient is scheduled for the above procedure on 07/03/2022 with Dr. John Giovanni, MD. In review of her medication reconciliation it was noted that patient is on a prescribed GLP-1 medication (dulaglutide). Per guidelines issued by the American Society of Anesthesiologists (ASA), it is recommended that these medications be held for 7 days prior to the patient undergoing any type of elective surgical procedure.   Reached out to prescribing provider Gabriel Carina, MD) to make them aware of the guidelines from anesthesia. Given that this patient takes the prescribe GLP-1 medication for her  diabetes diagnosis rather than for weight loss, recommendations from the prescribing provider were solicited. Prescribing provider made aware of the following so that informed decision/POC can be developed for this patient that may be taking medications belonging to these drug classes:  Oral GLP-1 medications will be held 1 day prior to surgery.  Injectable GLP-1 medications will be held 7 days prior to surgery.  Metformin is routinely held 48 hours prior to surgery due to renal concerns, potential need for contrasted imaging perioperatively, and the potential for tissue hypoxia leading to drug induced lactic acidosis.  All SGLT2i medications are held 72 hours prior to surgery as they can be associated with the increased potential for developing euglycemic diabetic ketoacidosis (EDKA).   Impression and Plan:  Heather Lopez is on a prescribed GLP-1  medication, which induces the known side effect of decreased gastric emptying. Efforts are bring made to mitigate the risk of perioperative hyperglycemic events, as elevated blood glucose levels have been found to contribute to intra/postoperative complications. Additionally, hyperglycemic extremes can potentially necessitate the postponing of a patient's elective case in order to better optimize perioperative glycemic control, again with the aforementioned guidelines in place. With this in mind, recommendations have been sought from the prescribing provider, who has cleared patient to proceed with holding the prescribed GLP-1 as per the guidelines from the ASA.   Provider recommending: no further recommendations received from the prescribing provider.  Copy of signed clearance and recommendations placed on patient's chart for inclusion in their medical record and for review by the surgical/anesthetic team on the day of her procedure.   Honor Loh, MSN, APRN, FNP-C, CEN [facility]  Peri-operative Services Nurse Practitioner Phone: [phone] 06/29/22 11:55 AM  NOTE: This note has been prepared using Dragon dictation software. Despite my best ability to proofread, there is always the potential that unintentional transcriptional errors may still occur from this process.

## 2022-06-29 NOTE — Progress Notes (Signed)
Starting Bactrim SS BID x5 days in advance of upcoming procedure; dose adjusted per renal function. Notified patient via telephone, she expressed understanding.

## 2022-07-03 ENCOUNTER — Ambulatory Visit: Payer: Medicare Other | Admitting: Urgent Care

## 2022-07-03 ENCOUNTER — Ambulatory Visit: Payer: Medicare Other

## 2022-07-03 ENCOUNTER — Encounter: Payer: Self-pay | Admitting: Urology

## 2022-07-03 ENCOUNTER — Other Ambulatory Visit: Payer: Self-pay

## 2022-07-03 ENCOUNTER — Ambulatory Visit
Admission: RE | Admit: 2022-07-03 | Discharge: 2022-07-03 | Disposition: A | Discharge status: Home / Self Care | Payer: Medicare Other | Attending: Urology | Admitting: Urology

## 2022-07-03 ENCOUNTER — Encounter
Admission: RE | Disposition: A | Discharge status: Home / Self Care | Payer: Self-pay | Source: Home / Self Care | Attending: Urology

## 2022-07-03 DIAGNOSIS — N1330 Unspecified hydronephrosis: Secondary | ICD-10-CM | POA: Diagnosis not present

## 2022-07-03 DIAGNOSIS — Z87891 Personal history of nicotine dependence: Secondary | ICD-10-CM | POA: Insufficient documentation

## 2022-07-03 DIAGNOSIS — Z992 Dependence on renal dialysis: Secondary | ICD-10-CM | POA: Diagnosis not present

## 2022-07-03 DIAGNOSIS — N179 Acute kidney failure, unspecified: Secondary | ICD-10-CM | POA: Diagnosis present

## 2022-07-03 DIAGNOSIS — N1370 Vesicoureteral-reflux, unspecified: Secondary | ICD-10-CM | POA: Diagnosis not present

## 2022-07-03 DIAGNOSIS — R944 Abnormal results of kidney function studies: Secondary | ICD-10-CM | POA: Diagnosis not present

## 2022-07-03 DIAGNOSIS — N189 Chronic kidney disease, unspecified: Secondary | ICD-10-CM | POA: Diagnosis not present

## 2022-07-03 DIAGNOSIS — E1122 Type 2 diabetes mellitus with diabetic chronic kidney disease: Secondary | ICD-10-CM | POA: Diagnosis not present

## 2022-07-03 DIAGNOSIS — E785 Hyperlipidemia, unspecified: Secondary | ICD-10-CM | POA: Diagnosis not present

## 2022-07-03 DIAGNOSIS — N131 Hydronephrosis with ureteral stricture, not elsewhere classified: Secondary | ICD-10-CM | POA: Insufficient documentation

## 2022-07-03 DIAGNOSIS — I129 Hypertensive chronic kidney disease with stage 1 through stage 4 chronic kidney disease, or unspecified chronic kidney disease: Secondary | ICD-10-CM | POA: Diagnosis not present

## 2022-07-03 DIAGNOSIS — E1165 Type 2 diabetes mellitus with hyperglycemia: Secondary | ICD-10-CM

## 2022-07-03 DIAGNOSIS — Z01812 Encounter for preprocedural laboratory examination: Secondary | ICD-10-CM

## 2022-07-03 DIAGNOSIS — Z87442 Personal history of urinary calculi: Secondary | ICD-10-CM | POA: Insufficient documentation

## 2022-07-03 HISTORY — PX: CYSTOSCOPY WITH STENT PLACEMENT: SHX5790

## 2022-07-03 LAB — POCT I-STAT, CHEM 8
BUN: 106 mg/dL — ABNORMAL HIGH (ref 8–23)
Calcium, Ion: 1.24 mmol/L (ref 1.15–1.40)
Chloride: 109 mmol/L (ref 98–111)
Creatinine, Ser: 4.9 mg/dL — ABNORMAL HIGH (ref 0.44–1.00)
Glucose, Bld: 169 mg/dL — ABNORMAL HIGH (ref 70–99)
HCT: 26 % — ABNORMAL LOW (ref 36.0–46.0)
Hemoglobin: 8.8 g/dL — ABNORMAL LOW (ref 12.0–15.0)
Potassium: 4.3 mmol/L (ref 3.5–5.1)
Sodium: 138 mmol/L (ref 135–145)
TCO2: 17 mmol/L — ABNORMAL LOW (ref 22–32)

## 2022-07-03 LAB — GLUCOSE, CAPILLARY
Glucose-Capillary: 166 mg/dL — ABNORMAL HIGH (ref 70–99)
Glucose-Capillary: 185 mg/dL — ABNORMAL HIGH (ref 70–99)

## 2022-07-03 SURGERY — CYSTOSCOPY, WITH STENT INSERTION
Anesthesia: General | Site: Ureter | Laterality: Right

## 2022-07-03 MED ORDER — SODIUM CHLORIDE 0.9 % IR SOLN
Status: DC | PRN
  Administered 2022-07-03 (×3): 3000 mL via INTRAVESICAL

## 2022-07-03 MED ORDER — PROPOFOL 10 MG/ML IV BOLUS
INTRAVENOUS | Status: DC | PRN
  Administered 2022-07-03: 150 mg via INTRAVENOUS

## 2022-07-03 MED ORDER — ONDANSETRON HCL 4 MG/2ML IJ SOLN
INTRAMUSCULAR | Status: DC | PRN
  Administered 2022-07-03: 4 mg via INTRAVENOUS

## 2022-07-03 MED ORDER — CEFAZOLIN SODIUM-DEXTROSE 2-4 GM/100ML-% IV SOLN
INTRAVENOUS | Status: AC
  Filled 2022-07-03: qty 100

## 2022-07-03 MED ORDER — ORAL CARE MOUTH RINSE: 15.0000 mL | Freq: Once | OROMUCOSAL | Status: AC

## 2022-07-03 MED ORDER — FENTANYL CITRATE (PF) 100 MCG/2ML IJ SOLN: 25.0000 ug | INTRAMUSCULAR | Status: DC | PRN

## 2022-07-03 MED ORDER — ACETAMINOPHEN 10 MG/ML IV SOLN
INTRAVENOUS | Status: AC
  Filled 2022-07-03: qty 100

## 2022-07-03 MED ORDER — FENTANYL CITRATE (PF) 100 MCG/2ML IJ SOLN
INTRAMUSCULAR | Status: AC
  Filled 2022-07-03: qty 2

## 2022-07-03 MED ORDER — DEXAMETHASONE SODIUM PHOSPHATE 10 MG/ML IJ SOLN
INTRAMUSCULAR | Status: DC | PRN
  Administered 2022-07-03: 5 mg via INTRAVENOUS

## 2022-07-03 MED ORDER — OXYCODONE HCL 5 MG PO TABS: 5.0000 mg | ORAL_TABLET | Freq: Once | ORAL | Status: DC | PRN

## 2022-07-03 MED ORDER — ONDANSETRON HCL 4 MG/2ML IJ SOLN
INTRAMUSCULAR | Status: AC
  Filled 2022-07-03: qty 2

## 2022-07-03 MED ORDER — URIBEL 118 MG PO CAPS: 1.0000 | ORAL_CAPSULE | Freq: Three times a day (TID) | ORAL | 0 refills | Status: DC | PRN

## 2022-07-03 MED ORDER — LIDOCAINE HCL (CARDIAC) PF 100 MG/5ML IV SOSY
PREFILLED_SYRINGE | INTRAVENOUS | Status: DC | PRN
  Administered 2022-07-03: 50 mg via INTRAVENOUS

## 2022-07-03 MED ORDER — LIDOCAINE HCL (PF) 2 % IJ SOLN
INTRAMUSCULAR | Status: AC
  Filled 2022-07-03: qty 5

## 2022-07-03 MED ORDER — EPHEDRINE 5 MG/ML INJ
INTRAVENOUS | Status: AC
  Filled 2022-07-03: qty 5

## 2022-07-03 MED ORDER — EPHEDRINE SULFATE (PRESSORS) 50 MG/ML IJ SOLN
INTRAMUSCULAR | Status: DC | PRN
  Administered 2022-07-03: 5 mg via INTRAVENOUS

## 2022-07-03 MED ORDER — CHLORHEXIDINE GLUCONATE 0.12 % MT SOLN: 15.0000 mL | Freq: Once | OROMUCOSAL | Status: AC

## 2022-07-03 MED ORDER — ACETAMINOPHEN 10 MG/ML IV SOLN
INTRAVENOUS | Status: DC | PRN
  Administered 2022-07-03: 1000 mg via INTRAVENOUS

## 2022-07-03 MED ORDER — CHLORHEXIDINE GLUCONATE 0.12 % MT SOLN
OROMUCOSAL | Status: AC
  Administered 2022-07-03: 15 mL via OROMUCOSAL
  Filled 2022-07-03: qty 15

## 2022-07-03 MED ORDER — CEFAZOLIN SODIUM-DEXTROSE 2-4 GM/100ML-% IV SOLN
2.0000 g | INTRAVENOUS | Status: AC
  Administered 2022-07-03: 2 g via INTRAVENOUS

## 2022-07-03 MED ORDER — FENTANYL CITRATE (PF) 100 MCG/2ML IJ SOLN
INTRAMUSCULAR | Status: DC | PRN
Start: 2022-07-03 — End: 2022-07-03
  Administered 2022-07-03 (×3): 25 ug via INTRAVENOUS

## 2022-07-03 MED ORDER — DEXAMETHASONE SODIUM PHOSPHATE 10 MG/ML IJ SOLN
INTRAMUSCULAR | Status: AC
  Filled 2022-07-03: qty 1

## 2022-07-03 MED ORDER — INSULIN ASPART 100 UNIT/ML IJ SOLN
3.0000 [IU] | Freq: Once | INTRAMUSCULAR | Status: DC
Start: 2022-07-03 — End: 2022-07-03

## 2022-07-03 MED ORDER — PROPOFOL 10 MG/ML IV BOLUS
INTRAVENOUS | Status: AC
  Filled 2022-07-03: qty 20

## 2022-07-03 MED ORDER — OXYCODONE HCL 5 MG/5ML PO SOLN: 5.0000 mg | Freq: Once | ORAL | Status: DC | PRN

## 2022-07-03 MED ORDER — SUCCINYLCHOLINE CHLORIDE 200 MG/10ML IV SOSY
PREFILLED_SYRINGE | INTRAVENOUS | Status: DC | PRN
  Administered 2022-07-03: 100 mg via INTRAVENOUS

## 2022-07-03 MED ORDER — SODIUM CHLORIDE 0.9 % IV SOLN: INTRAVENOUS | Status: DC

## 2022-07-03 MED ORDER — INSULIN ASPART 100 UNIT/ML IJ SOLN
3.0000 [IU] | Freq: Once | INTRAMUSCULAR | Status: AC
  Administered 2022-07-03: 3 [IU] via SUBCUTANEOUS

## 2022-07-03 MED ORDER — INSULIN ASPART 100 UNIT/ML IJ SOLN
INTRAMUSCULAR | Status: AC
  Filled 2022-07-03: qty 1

## 2022-07-03 MED ORDER — IOHEXOL 180 MG/ML  SOLN
INTRAMUSCULAR | Status: DC | PRN
  Administered 2022-07-03 (×2): 10 mL

## 2022-07-03 MED ORDER — STERILE WATER FOR IRRIGATION IR SOLN
Status: DC | PRN
  Administered 2022-07-03: 500 mL

## 2022-07-03 SURGICAL SUPPLY — 20 items
BAG DRAIN SIEMENS DORNER NS (MISCELLANEOUS) ×2 IMPLANT
BRUSH SCRUB EZ 1% IODOPHOR (MISCELLANEOUS) ×2 IMPLANT
CATH URETL OPEN 5X70 (CATHETERS) ×1 IMPLANT
GAUZE 4X4 16PLY ~~LOC~~+RFID DBL (SPONGE) ×4 IMPLANT
GLOVE SURG UNDER POLY LF SZ7.5 (GLOVE) ×2 IMPLANT
GOWN STRL REUS W/ TWL XL LVL3 (GOWN DISPOSABLE) ×1 IMPLANT
GOWN STRL REUS W/TWL XL LVL3 (GOWN DISPOSABLE) ×1
GUIDEWIRE STR DUAL SENSOR (WIRE) ×3 IMPLANT
IV NS IRRIG 3000ML ARTHROMATIC (IV SOLUTION) ×4 IMPLANT
KIT TURNOVER CYSTO (KITS) ×2 IMPLANT
MANIFOLD NEPTUNE II (INSTRUMENTS) ×2 IMPLANT
PACK CYSTO AR (MISCELLANEOUS) ×2 IMPLANT
SET CYSTO W/LG BORE CLAMP LF (SET/KITS/TRAYS/PACK) ×2 IMPLANT
STENT URET 6FRX24 CONTOUR (STENTS) IMPLANT
STENT URET 6FRX26 CONTOUR (STENTS) IMPLANT
STENT URO INLAY 6FRX26CM (STENTS) ×1 IMPLANT
SURGILUBE 2OZ TUBE FLIPTOP (MISCELLANEOUS) ×2 IMPLANT
SYR TOOMEY IRRIG 70ML (MISCELLANEOUS) ×2
SYRINGE TOOMEY IRRIG 70ML (MISCELLANEOUS) IMPLANT
WATER STERILE IRR 500ML POUR (IV SOLUTION) ×2 IMPLANT

## 2022-07-03 NOTE — Interval H&P Note (Signed)
History and Physical Interval Note:  Lasix renogram showed a traction of the right kidney.  She has elected a chronic indwelling stent and presents today for stent cystoscopy with stent placement.  All questions were answered and she desires to proceed  CV: RRR Lungs: Clear  07/03/2022 9:40 AM  [name]  has presented today for surgery, with the diagnosis of Right Hydronephrosis.  The various methods of treatment have been discussed with the patient and family. After consideration of risks, benefits and other options for treatment, the patient has consented to  Procedure(s): CYSTOSCOPY WITH STENT PLACEMENT (Right) as a surgical intervention.  The patient's history has been reviewed, patient examined, no change in status, stable for surgery.  I have reviewed the patient's chart and labs.  Questions were answered to the patient's satisfaction.     [place]

## 2022-07-03 NOTE — Anesthesia Preprocedure Evaluation (Addendum)
Anesthesia Evaluation  Patient identified by MRN, date of birth, ID band Patient awake    Reviewed: Allergy & Precautions, NPO status , Patient's Chart, lab work & pertinent test results  History of Anesthesia Complications Negative for: history of anesthetic complications  Airway Mallampati: III  TM Distance: <3 FB Neck ROM: full    Dental  (+) Chipped   Pulmonary neg pulmonary ROS, neg shortness of breath, former smoker,    Pulmonary exam normal        Cardiovascular Exercise Tolerance: Good hypertension, (-) angina(-) Past MI and (-) DOE Normal cardiovascular exam     Neuro/Psych negative neurological ROS  negative psych ROS   GI/Hepatic Neg liver ROS, PUD, neg GERD  ,  Endo/Other  diabetes, Type 2  Renal/GU Renal disease     Musculoskeletal   Abdominal   Peds  Hematology  (+) Blood dyscrasia, anemia ,   Anesthesia Other Findings BUN elevated. Pt with no symptoms of acidemia or encephalopathy. [place] aware of BUN/Cr and will have patient seen in office this week. Pt is aware of her current kidney function and will seek medical treatment for any concerning symptoms prior to office visit.    Past Medical History: 06/27/2021: Acute renal failure ([place]) No date: Anxiety No date: Aortic atherosclerosis (HCC) No date: Cataract No date: Depression No date: Gout No date: History of kidney stones No date: Hyperlipidemia No date: Hypertension No date: Obesity 06/2021: Obstruction of left ureteropelvic junction (UPJ) due to stone No date: Osteoarthritis 06/27/2021: Perforated duodenal ulcer ([place]) 06/2021: Pyonephrosis     Comment:  left 06/27/2021: Septic shock (HCC) No date: Type 2 diabetes mellitus with stage 5 chronic kidney disease  ([place]) No date: Vertigo  Past Surgical History: 2019: CATARACT EXTRACTION, BILATERAL; Bilateral 08/19/2015: COLONOSCOPY WITH PROPOFOL; N/A     Comment:  Procedure: COLONOSCOPY  WITH PROPOFOL;  Surgeon: Lucilla Lame, MD;  Location: [place];  Service:               Endoscopy;  Laterality: N/A;  diabetic - insulin 12/22/2019: CYSTOSCOPY W/ RETROGRADES; Bilateral     Comment:  Procedure: CYSTOSCOPY WITH RETROGRADE PYELOGRAM;                Surgeon: Abbie Sons, MD;  Location: [facility];                Service: Urology;  Laterality: Bilateral; 07/05/2021: CYSTOSCOPY W/ URETERAL STENT PLACEMENT; Bilateral     Comment:  Procedure: CYSTOSCOPY WITH RETROGRADE               PYELOGRAM/BILATERAL URETERAL STENT PLACEMENT;  Surgeon:               Abbie Sons, MD;  Location: [facility];  Service:               Urology;  Laterality: Bilateral; 12/26/2021: CYSTOSCOPY W/ URETERAL STENT PLACEMENT; Bilateral     Comment:  Procedure: CYSTOSCOPY WITH STENT REMOVAL;  Surgeon:               Abbie Sons, MD;  Location: [facility];  Service:               Urology;  Laterality: Bilateral; 03/13/2022: CYSTOSCOPY W/ URETERAL STENT PLACEMENT; Right     Comment:  Procedure: CYSTOSCOPY WITH RETROGRADE PYELOGRAM/URETERAL              STENT  PLACEMENT;  Surgeon: Abbie Sons, MD;                Location: [facility];  Service: Urology;  Laterality:               Right; 05/08/2022: CYSTOSCOPY W/ URETERAL STENT REMOVAL; Right     Comment:  Procedure: CYSTOSCOPY WITH STENT REMOVAL;  Surgeon:               Abbie Sons, MD;  Location: [facility];  Service:               Urology;  Laterality: Right; 12/22/2019: CYSTOSCOPY WITH BIOPSY; N/A     Comment:  Procedure: CYSTOSCOPY WITH bladder BIOPSY;  Surgeon:               Abbie Sons, MD;  Location: [facility];  Service:               Urology;  Laterality: N/A; 03/13/2022: CYSTOSCOPY WITH BIOPSY     Comment:  Procedure: CYSTOSCOPY WITH BLADDER BIOPSY;  Surgeon:               Abbie Sons, MD;  Location: [facility];  Service:               Urology;; 12/22/2019: CYSTOSCOPY WITH STENT PLACEMENT; Right     Comment:   Procedure: CYSTOSCOPY WITH STENT PLACEMENT;  Surgeon:               Abbie Sons, MD;  Location: [facility];  Service:               Urology;  Laterality: Right; 12/22/2019: CYSTOSCOPY WITH URETEROSCOPY; Bilateral     Comment:  Procedure: CYSTOSCOPY WITH URETEROSCOPY;  Surgeon:               Abbie Sons, MD;  Location: [facility];  Service:               Urology;  Laterality: Bilateral; 12/22/2019: CYSTOSCOPY/URETEROSCOPY/HOLMIUM LASER/STENT PLACEMENT;  Right     Comment:  Procedure: CYSTOSCOPY/URETEROSCOPY/HOLMIUM LASER/STENT               PLACEMENT;  Surgeon: Abbie Sons, MD;  Location:               [facility];  Service: Urology;  Laterality: Right; 12/08/2021: CYSTOSCOPY/URETEROSCOPY/HOLMIUM LASER/STENT PLACEMENT;  Bilateral     Comment:  Procedure: CYSTOSCOPY/URETEROSCOPY/HOLMIUM LASER/STENT               PLACEMENT;  Surgeon: [name], MD;  Location:               [facility];  Service: Urology;  Laterality: Bilateral; 06/29/2021: DIALYSIS/PERMA CATHETER INSERTION; N/A     Comment:  Procedure: DIALYSIS/PERMA CATHETER INSERTION;  Surgeon:               Algernon Huxley, MD;  Location: [place] [facility];                Service: Cardiovascular;  Laterality: N/A; 07/13/2021: DIALYSIS/PERMA CATHETER INSERTION; N/A     Comment:  Procedure: DIALYSIS/PERMA CATHETER INSERTION;  Surgeon:               Algernon Huxley, MD;  Location: [place] [facility];                Service: Cardiovascular;  Laterality: N/A; No date: EYE SURGERY 08/01/2021: IR GASTROSTOMY TUBE MOD SED 06/29/2021: LAPAROTOMY; N/A  Comment:  Procedure: EXPLORATORY LAPAROTOMY WITH REPAIR OF               DUODENAL PERFORATION;  Surgeon: [name], MD;                Location: [facility];  Service: General;  Laterality: N/A; 08/19/2015: POLYPECTOMY     Comment:  Procedure: POLYPECTOMY;  Surgeon: Lucilla Lame, MD;                Location: [place];  Service: Endoscopy;; 1992: TUBAL LIGATION  BMI    Body Mass  Index: 35.62 kg/m      Reproductive/Obstetrics negative OB ROS                           Anesthesia Physical Anesthesia Plan  ASA: 3  Anesthesia Plan: General ETT   Post-op Pain Management:    Induction: Intravenous  PONV Risk Score and Plan: Ondansetron, Dexamethasone, Midazolam and Treatment may vary due to age or medical condition  Airway Management Planned: Oral ETT  Additional Equipment:   Intra-op Plan:   Post-operative Plan: Extubation in OR  Informed Consent: I have reviewed the patients History and Physical, chart, labs and discussed the procedure including the risks, benefits and alternatives for the proposed anesthesia with the patient or authorized representative who has indicated his/her understanding and acceptance.     Dental Advisory Given  Plan Discussed with: Anesthesiologist, CRNA and Surgeon  Anesthesia Plan Comments: (Patient consented for risks of anesthesia including but not limited to:  - adverse reactions to medications - damage to eyes, teeth, lips or other oral mucosa - nerve damage due to positioning  - sore throat or hoarseness - Damage to heart, brain, nerves, lungs, other parts of body or loss of life  Patient voiced understanding.)       Anesthesia Quick Evaluation

## 2022-07-03 NOTE — Transfer of Care (Signed)
Immediate Anesthesia Transfer of Care Note  Patient: Heather Lopez  Procedure(s) Performed: CYSTOSCOPY/RIGHT URETEROSCOPY WITH STENT PLACEMENT (Right: Ureter)  Patient Location: PACU  Anesthesia Type:General  Level of Consciousness: awake and drowsy  Airway & Oxygen Therapy: Patient Spontanous Breathing and Patient connected to face mask oxygen  Post-op Assessment: Report given to RN and Post -op Vital signs reviewed and stable  Post vital signs: Reviewed and stable  Last Vitals:  Vitals Value Taken Time  BP 163/83 07/03/22 1126  Temp    Pulse 63 07/03/22 1126  Resp 16 07/03/22 1126  SpO2 100 % 07/03/22 1126  Vitals shown include unvalidated device data.  Last Pain:  Vitals:   07/03/22 0659  TempSrc: Temporal  PainSc: 0-No pain      Patients Stated Pain Goal: 0 (64/35/39 1225)  Complications: No notable events documented.

## 2022-07-03 NOTE — Anesthesia Postprocedure Evaluation (Signed)
Anesthesia Post Note  Patient: Heather Lopez  Procedure(s) Performed: CYSTOSCOPY/RIGHT URETEROSCOPY WITH STENT PLACEMENT (Right: Ureter)  Patient location during evaluation: PACU Anesthesia Type: General Level of consciousness: awake and alert Pain management: pain level controlled Vital Signs Assessment: post-procedure vital signs reviewed and stable Respiratory status: spontaneous breathing, nonlabored ventilation and respiratory function stable Cardiovascular status: blood pressure returned to baseline and stable Postop Assessment: no apparent nausea or vomiting Anesthetic complications: no   No notable events documented.   Last Vitals:  Vitals:   07/03/22 1200 07/03/22 1209  BP: (!) 155/69 (!) 147/54  Pulse: (!) 57 (!) 58  Resp: 17 16  Temp:  36.6 C  SpO2: 93% 99%    Last Pain:  Vitals:   07/03/22 1209  TempSrc: Temporal  PainSc: 0-No pain                 [place] Ouch

## 2022-07-03 NOTE — Discharge Instructions (Addendum)
AMBULATORY SURGERY  DISCHARGE INSTRUCTIONS   The drugs that you were given will stay in your system until tomorrow so for the next 24 hours you should not:  Drive an automobile Make any legal decisions Drink any alcoholic beverage   You may resume regular meals tomorrow.  Today it is better to start with liquids and gradually work up to solid foods.  You may eat anything you prefer, but it is better to start with liquids, then soup and crackers, and gradually work up to solid foods.   Please notify your doctor immediately if you have any unusual bleeding, trouble breathing, redness and pain at the surgery site, drainage, fever, or pain not relieved by medication.       Please contact your physician with any problems or Same Day Surgery at [phone], Monday through Friday 6 am to 4 pm, or [place] [facility] number at [phone].    DISCHARGE INSTRUCTIONS FOR URETERAL STENT   MEDICATIONS:  1. Resume all your other meds from home.  2.  AZO (over-the-counter) can help with the burning/stinging when you urinate. 3.  Rx Uribel for burning, bladder spasm sent to pharmacy  ACTIVITY:  1. May resume regular activities in 24 hours. 2. No driving while on narcotic pain medications  3. Drink plenty of water  4. Continue to walk at home - you can still get blood clots when you are at home, so keep active, but don't over do it.  5. May return to work/school tomorrow or when you feel ready   SIGNS/SYMPTOMS TO CALL:  Common postoperative symptoms include urinary frequency, urgency, bladder spasm and blood in the urine  Please call us if you have a fever greater than 101.5, uncontrolled nausea/vomiting, uncontrolled pain, dizziness, unable to urinate, excessively bloody urine, chest pain, shortness of breath, leg swelling, leg pain, or any other concerns or questions.   You can reach [place] at [phone].   FOLLOW-UP:  1. You we will be contacted for a follow-up appointment  with x-ray and a lab visit for a repeat creatinine towards the end of this week

## 2022-07-03 NOTE — Anesthesia Procedure Notes (Signed)
Procedure Name: Intubation Date/Time: 07/03/2022 9:51 AM  Performed by: Johnna Acosta, CRNAPre-anesthesia Checklist: Patient identified, Emergency Drugs available, Suction available and Patient being monitored Patient Re-evaluated:Patient Re-evaluated prior to induction Oxygen Delivery Method: Circle system utilized Preoxygenation: Pre-oxygenation with 100% oxygen Induction Type: IV induction, Rapid sequence and Cricoid Pressure applied Laryngoscope Size: McGraph and 3 Grade View: Grade I Tube type: Oral Tube size: 7.0 mm Number of attempts: 1 Airway Equipment and Method: Stylet and Video-laryngoscopy Placement Confirmation: ETT inserted through vocal cords under direct vision, positive ETCO2 and breath sounds checked- equal and bilateral Secured at: 21 cm Tube secured with: Tape Dental Injury: Teeth and Oropharynx as per pre-operative assessment

## 2022-07-03 NOTE — Op Note (Signed)
Preoperative diagnosis:  Right hydronephrosis AKI with rising creatinine  Postoperative diagnosis:  Same  Procedure: Cystoscopy with right retrograde pyelogram Right ureteroscopy-diagnostic Placement right ureteral stent  Surgeon: Abbie Sons, MD  Anesthesia: General  Complications: None  Intraoperative findings:  Cystoscopy-bladder mucosa without solid or papillary lesions.  Patulous UOs bilaterally Right retrograde pyelogram moderate-severe hydronephrosis and hydroureter with marked dilation of the lower proximal/mid ureter with acute narrowing.  No filling defect noted Right ureteroscopy-narrowing right mid ureter without definite stricture.  Possible extrinsic compression  EBL: Minimal  Specimens: None  Indication: Heather Lopez is a 76 y.o. female with a complicated urologic history including bilateral nephrolithiasis, recurrent UTI and hydronephrosis.  She underwent cystoscopy with right retrograde pyelogram for right hydronephrosis April 2023.  She was noted to have bilateral vesicoureteral reflux and at that time felt her hydronephrosis was nonobstructive.  There was persistent retained contrast in the right collecting system and it was elected to place a right ureteral stent.  Her creatinine did improve with stent placement.  Since no definite obstructive lesion was identified her stent was removed on 05/08/2022 and she has had a slowly rising creatinine.  A Lasix renogram was performed which was felt to right ureteral obstruction.  After reviewing the management options for treatment, she elected to proceed with the above surgical procedure(s). We have discussed the potential benefits and risks of the procedure, side effects of the proposed treatment, the likelihood of the patient achieving the goals of the procedure, and any potential problems that might occur during the procedure or recuperation. Informed consent has been obtained.  Description of procedure:  The patient  was taken to the operating room and general anesthesia was induced.  The patient was placed in the dorsal lithotomy position, prepped and draped in the usual sterile fashion, and preoperative antibiotics were administered. A preoperative time-out was performed.   A 21 French cystoscope was lubricated and passed per urethra.  Panendoscopy was performed with findings as described above.  A 0.038 Sensor wire was placed into the right ureteral orifice and advanced proximally into the distal ureter.  A 5 French open-ended ureteral catheter was then placed over the guidewire and advanced to the distal ureter.  The guidewire was removed and a retrograde pyelogram was performed with findings as described above.  The guidewire was replaced and advanced to the region of the renal pelvis under fluoroscopic guidance followed by removal of the ureteral catheter.  Based on retrograde pyelogram findings it was elected to perform diagnostic ureteroscopy.  A 4.5 French semirigid ureteroscope was passed per urethra and into the right ureteral orifice and advanced proximally with findings described above.  The ureteroscope was removed.  A 72F/26 cm Bard Optima Inlay stent was placed on fluoroscopic guidance.  After stent placement proximal migration of the distal tip was noted.  The ureteroscope was removed placed and the distal tip was visualized just inside the UO.  This portion of the procedure was prolonged due to difficulty grasping the stent with a forceps.  A 6.5 French semirigid ureteroscope was utilized for better irrigation flow.  A pair of metallic graspers small end to be placed through the ureteroscope was located and the stent was withdrawn to the bladder under fluoroscopic guidance.  There was a good curl noted proximally under fluoroscopy and distally under direct vision.  The bladder was emptied and all instruments were removed.  She was then transported to the PACU in stable condition.    Plan: Follow-up  office visit with KUB 3 months    Abbie Sons, M.D.

## 2022-07-04 ENCOUNTER — Encounter: Payer: Self-pay | Admitting: Urology

## 2022-07-05 ENCOUNTER — Other Ambulatory Visit: Payer: Self-pay

## 2022-07-05 ENCOUNTER — Ambulatory Visit: Payer: Medicare Other | Admitting: Urology

## 2022-07-05 DIAGNOSIS — N1330 Unspecified hydronephrosis: Secondary | ICD-10-CM

## 2022-07-06 ENCOUNTER — Other Ambulatory Visit: Payer: Self-pay | Admitting: *Deleted

## 2022-07-06 MED ORDER — GEMTESA 75 MG PO TABS: 75.0000 mg | ORAL_TABLET | Freq: Every day | ORAL | 11 refills | Status: DC

## 2022-07-06 NOTE — Telephone Encounter (Signed)
Patient called requesting Gemtesa sent to [facility]. RX sent to pharmacy as requested.

## 2022-07-12 ENCOUNTER — Telehealth: Payer: Self-pay | Admitting: *Deleted

## 2022-07-12 NOTE — Telephone Encounter (Signed)
Prior auth requested for Gemtesa '75mg'$ . Tried prior auth with cover my meds, Gemtesa not on formulary, pt can try  OXYBUTYNIN CHLORIDE TABS '5MG'$   OXYBUTYNIN CHLORIDE ER TABS '5MG'$   OXYBUTYNIN CHLORIDE ER TABS '10MG'$   OXYBUTYNIN CHLORIDE ER TABS '15MG'$   TROSPIUM CHLORIDE TABS '20MG'$   TOLTERODINE TARTRATE ER CAPS '2MG'$   TOLTERODINE TARTRATE ER CAPS '4MG'$   Please advise   ( Prior auth sent to cover my meds)

## 2022-07-12 NOTE — Telephone Encounter (Signed)
Spoke with patient and the gemtesa is helping, I advised that it is not on her formulary and per pt she was told by [facility] that the rx needs to be signed by a MD??? I advised the alternatives or myrbetriq. Pt would like to try one of the alternatives. She also have appts with nephrology scheduled.

## 2022-07-12 NOTE — Telephone Encounter (Signed)
Can you please contact her and see if the [place] is helping her frequency? If so, options include giving her samples when possible or trial of Myrbetriq '25mg'$  daily as an alternative. I'd also like her to make sure she follows up with her nephrologist for labs to recheck her renal function since her stent has been placed.

## 2022-07-16 NOTE — Telephone Encounter (Signed)
Denied on August 18  CaseId:[id number]; Status:Denied; Review Type:Prior Auth;Appeal Information:   Attention:ATTN: [place] [id number]. WPTYY,PE,[id number] PNSQZ:[phone] GXI:[phone];   Important - Please read the below note on eAppeals: Please reference the denial letter for information on the rights for an appeal, rationale for the denial, and how to submit an appeal including if any information is needed to support the appeal. Note about urgent situations - Generally, an urgent situation is one which, in the opinion of the provider, the health of the patient may be in serious jeopardy or may experience pain that cannot be adequately controlled while waiting for a decision on the appeal.;

## 2022-07-16 NOTE — Telephone Encounter (Signed)
OK to give samples

## 2022-07-17 NOTE — Telephone Encounter (Signed)
Spoke with patient and will leave samples at the front desk.

## 2022-07-23 ENCOUNTER — Encounter: Payer: Self-pay | Admitting: Internal Medicine

## 2022-07-25 ENCOUNTER — Telehealth: Payer: Self-pay

## 2022-07-25 NOTE — Telephone Encounter (Signed)
Incoming message on triage line, patient states that she currently has a stent in place. Was placed 8/8 by [facility]. She states her urine was initially dark brown, but had cleared. Starting yesterday she noticed urine was dark brown with clots. Please advise.

## 2022-07-25 NOTE — Telephone Encounter (Signed)
Patient states she is not having any symptoms or pain . I talked with Dr. Bernardo Heater . He states drink water and let us know if she starts having any symptoms   , she does have a stent in

## 2022-08-02 ENCOUNTER — Ambulatory Visit: Payer: Medicare Other | Admitting: Urology

## 2022-08-03 ENCOUNTER — Ambulatory Visit: Payer: Medicare Other | Admitting: Urology

## 2022-08-19 IMAGING — CT CT ABD-PELV W/O CM
2 of 4 series · 15 of 46 positions shown, 17 images · non-contrast
Comparison: 12/15/2019

CLINICAL DATA: Hypertension, diabetes, weakness for several days,
acute abdominal pain, vomiting

EXAM:
CT ABDOMEN AND PELVIS WITHOUT CONTRAST
TECHNIQUE: Multidetector CT imaging of the abdomen and pelvis was performed
following the standard protocol without IV contrast.

[Series 2: routine abd/pel wo · axial · 0.98mm/px · z∈[-1156,-706]mm · 12 of 104 slices shown, 14 images]
[im 9/104  soft-tissue]
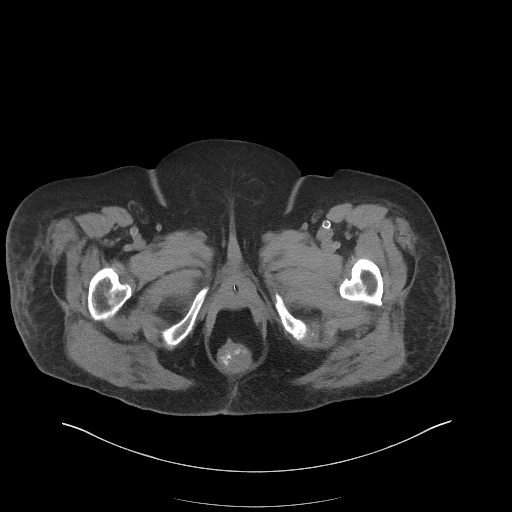
[im 9/104  bone]
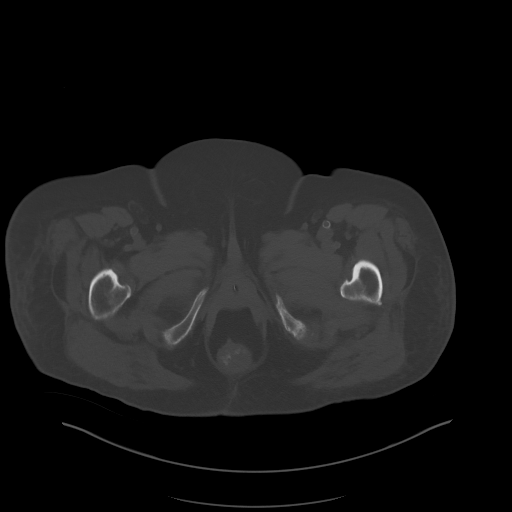
[im 17/104  soft-tissue]
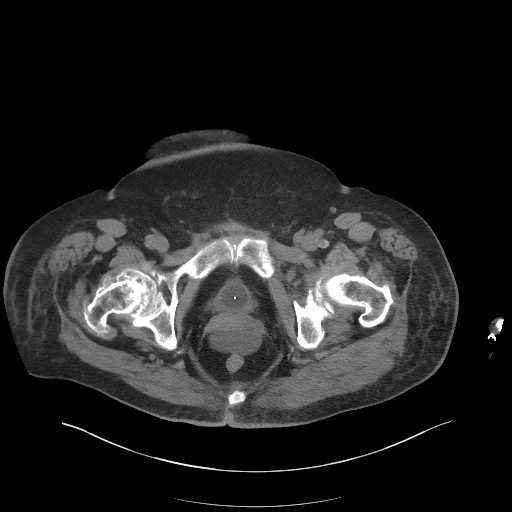
[im 25/104  soft-tissue]
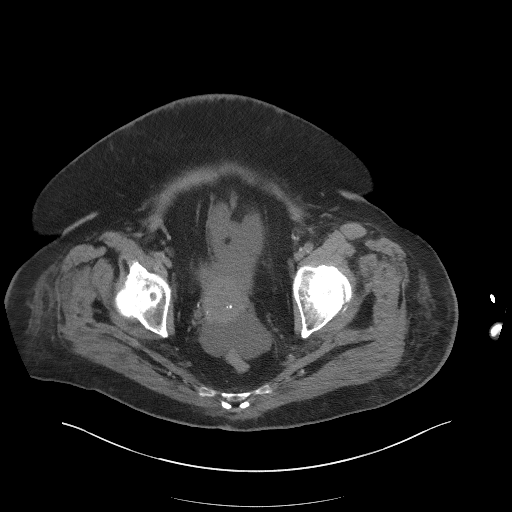
[im 33/104  soft-tissue]
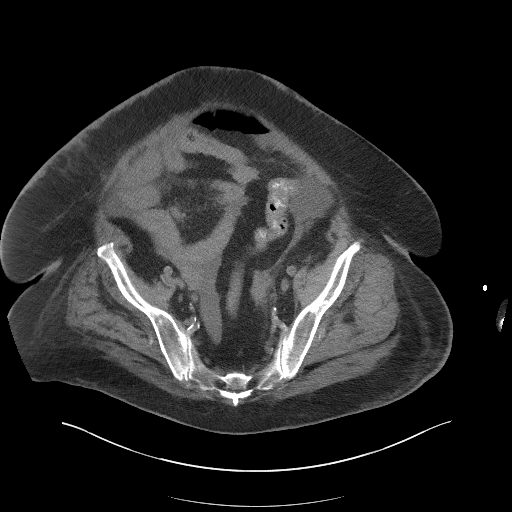
[im 42/104  soft-tissue]
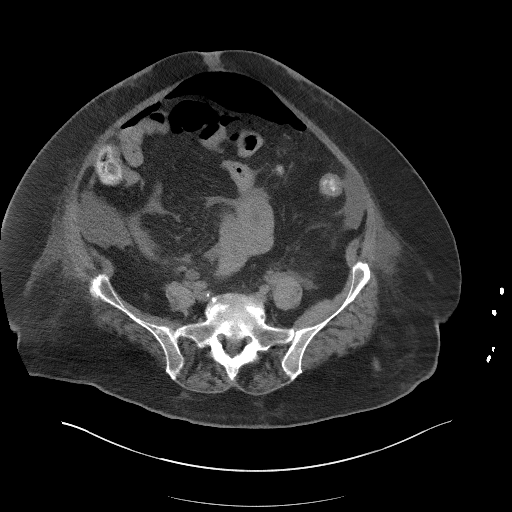
[im 50/104  soft-tissue]
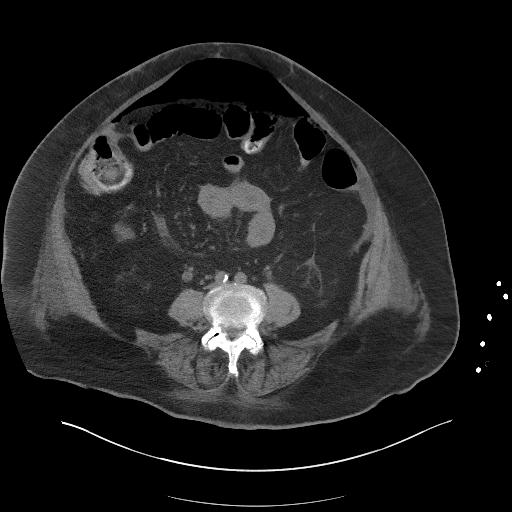
[im 58/104  soft-tissue]
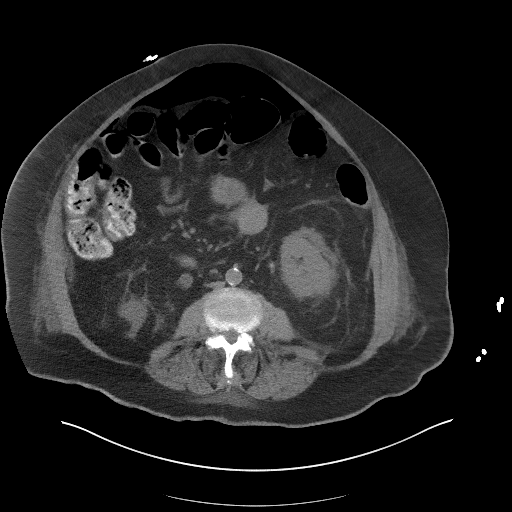
[im 66/104  soft-tissue]
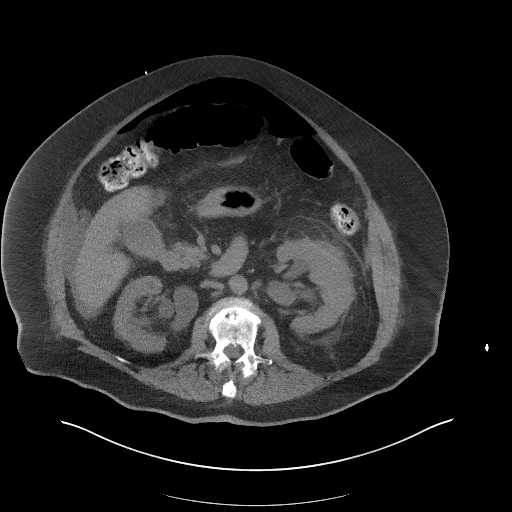
[im 75/104  soft-tissue]
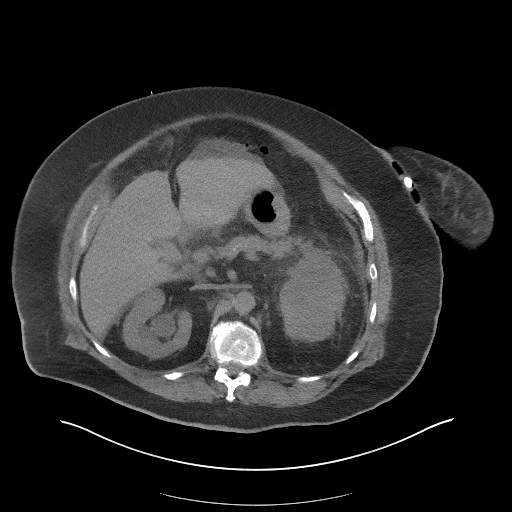
[im 75/104  bone]
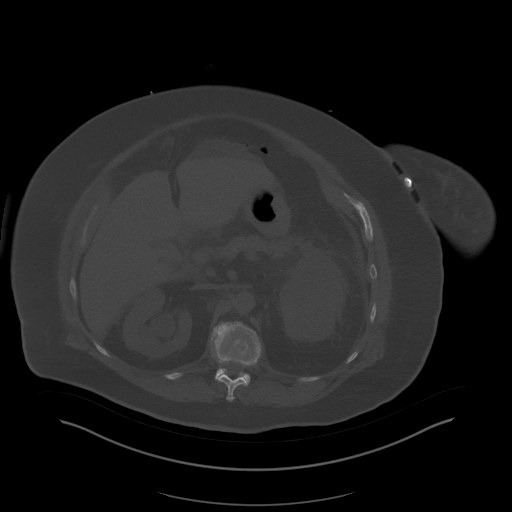
[im 83/104  soft-tissue]
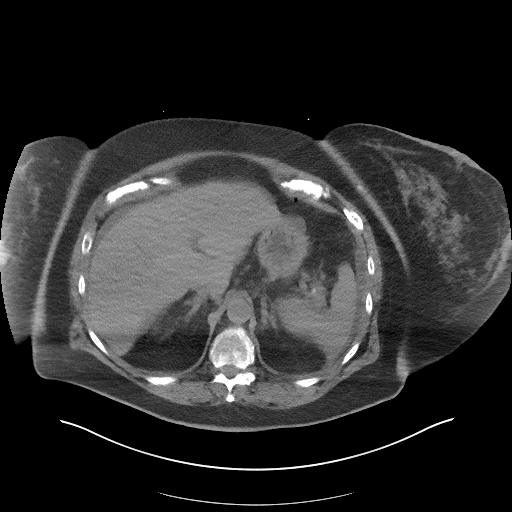
[im 91/104  soft-tissue]
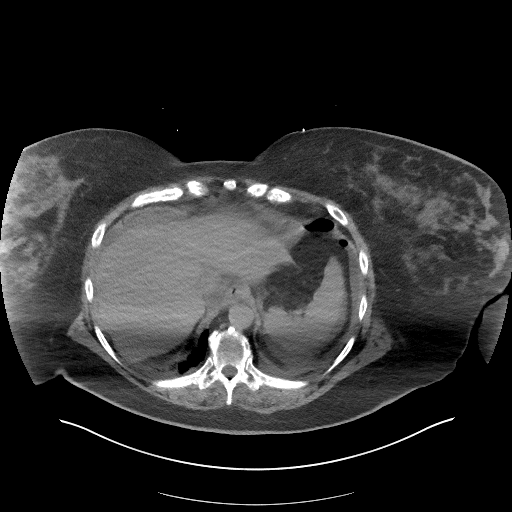
[im 99/104  soft-tissue]
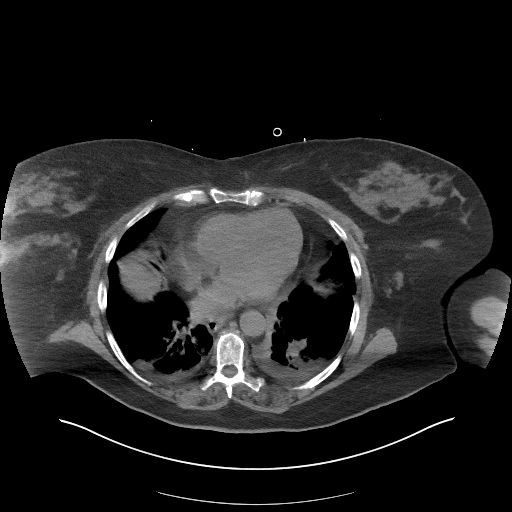

[Series 5: coronal st · coronal · 0.91mm/px · 3 of 125 slices shown]
[im 42/125  soft-tissue]
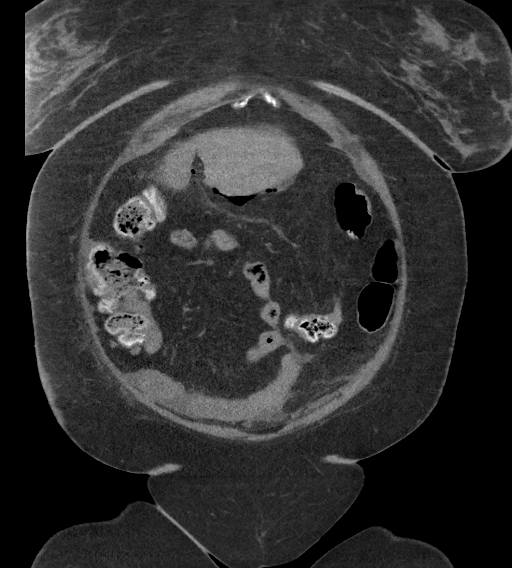
[im 56/125  soft-tissue]
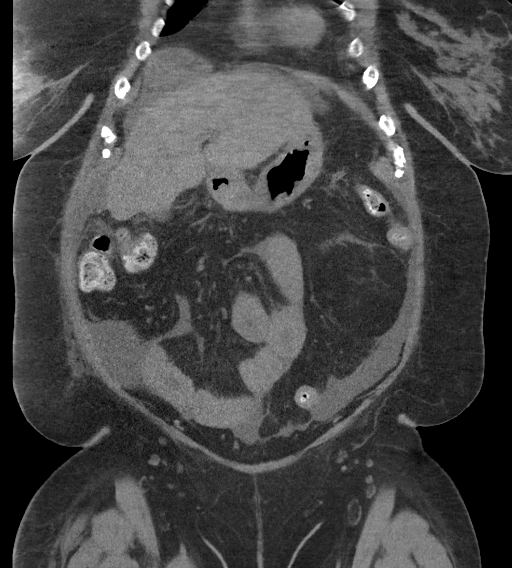
[im 69/125  soft-tissue]
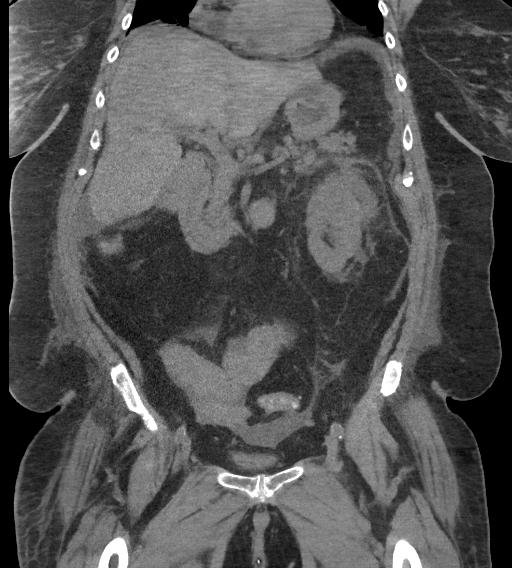

[15 of 46 positions shown; findings below may reference images not displayed]

FINDINGS: Lower chest: There are small bilateral pleural effusions, left
greater than right. Scattered hypoventilatory changes are seen at
the lung bases.

Hepatobiliary: Unenhanced imaging of the liver and gallbladder
demonstrates no focal abnormalities. No biliary duct dilation.

Pancreas: Unremarkable. No pancreatic ductal dilatation or
surrounding inflammatory changes.

Spleen: Normal in size without focal abnormality.

Adrenals/Urinary Tract: There is a heterogeneous mass with
ill-defined margins arising from the upper pole left kidney,
measuring 6.1 x 8.0 x 4.1 cm, compatible with renal cell carcinoma.
Additionally, there is an obstructing 12 mm proximal left ureteral
calculus at the UPJ, with moderate left-sided hydronephrosis. Other
nonobstructing left renal calculi are seen, measuring up to 18 mm.

There is chronic right hydronephrosis and hydroureter without clear
cause for obstruction. Nonobstructing calculi are seen within the
lower pole of the right kidney measuring up to 13 mm in size.

The bladder is decompressed with a Foley catheter. Adrenals are
unremarkable.

Stomach/Bowel: There is no bowel obstruction or ileus. Scattered
diverticulosis of the sigmoid colon without evidence of acute
diverticulitis.

There is a large amount of free gas throughout the peritoneal
cavity. Findings are consistent with a ruptured viscus. Given the
gas and fluid within the central upper abdomen, this could reflect
gastric perforation. Surgical consultation is recommended.

Vascular/Lymphatic: There is diffuse atherosclerosis. No pathologic
adenopathy within the abdomen or pelvis.

Reproductive: Uterus and bilateral adnexa are unremarkable.

Other: Small amount of ascites throughout the lower abdomen and
pelvis. Diffuse pneumoperitoneum as described above, compatible with
perforated viscus. The exact site of perforation is not identified,
though the stomach is suspected given the presence of fluid and gas
along the gastrohepatic ligament. No abdominal wall hernia.

Musculoskeletal: There are no acute bony abnormalities. There is
severe right hip osteoarthritis with more mild degenerative change
on the left. Reconstructed images demonstrate no additional
findings.
IMPRESSION: 1. Extensive pneumoperitoneum compatible with perforated viscus.
Gastric perforation is suspected, though bowel wall defect is not
definitively identified. Surgical consultation is recommended.
2. Large ill-defined mass arising from the upper pole left kidney
consistent with renal cell carcinoma. Dedicated renal MRI may be
useful when clinical situation permits.
3. Acute left-sided hydronephrosis due to a 12 mm left UPJ calculus.
4. Chronic right hydronephrosis and hydroureter without clear
etiology.
5. Bilateral nonobstructing renal calculi as above.
6. Small volume ascites.
7. Trace bilateral pleural effusions with patchy bibasilar
atelectasis.
8.  Aortic Atherosclerosis ([id number]).

Critical Value/emergent results were called by telephone at the time
of interpretation on 06/29/2021 at 959 pm to provider LANNY CATALINA ,
who verbally acknowledged these results.

## 2022-08-20 IMAGING — DX DG CHEST 1V PORT
1 series · 1 of 1 positions shown · non-contrast
Comparison: 06/27/2021.

CLINICAL DATA: Endotracheal tube present WOS.1 (ABM-B2-CM)

EXAM:
PORTABLE CHEST 1 VIEW

[chest ap]
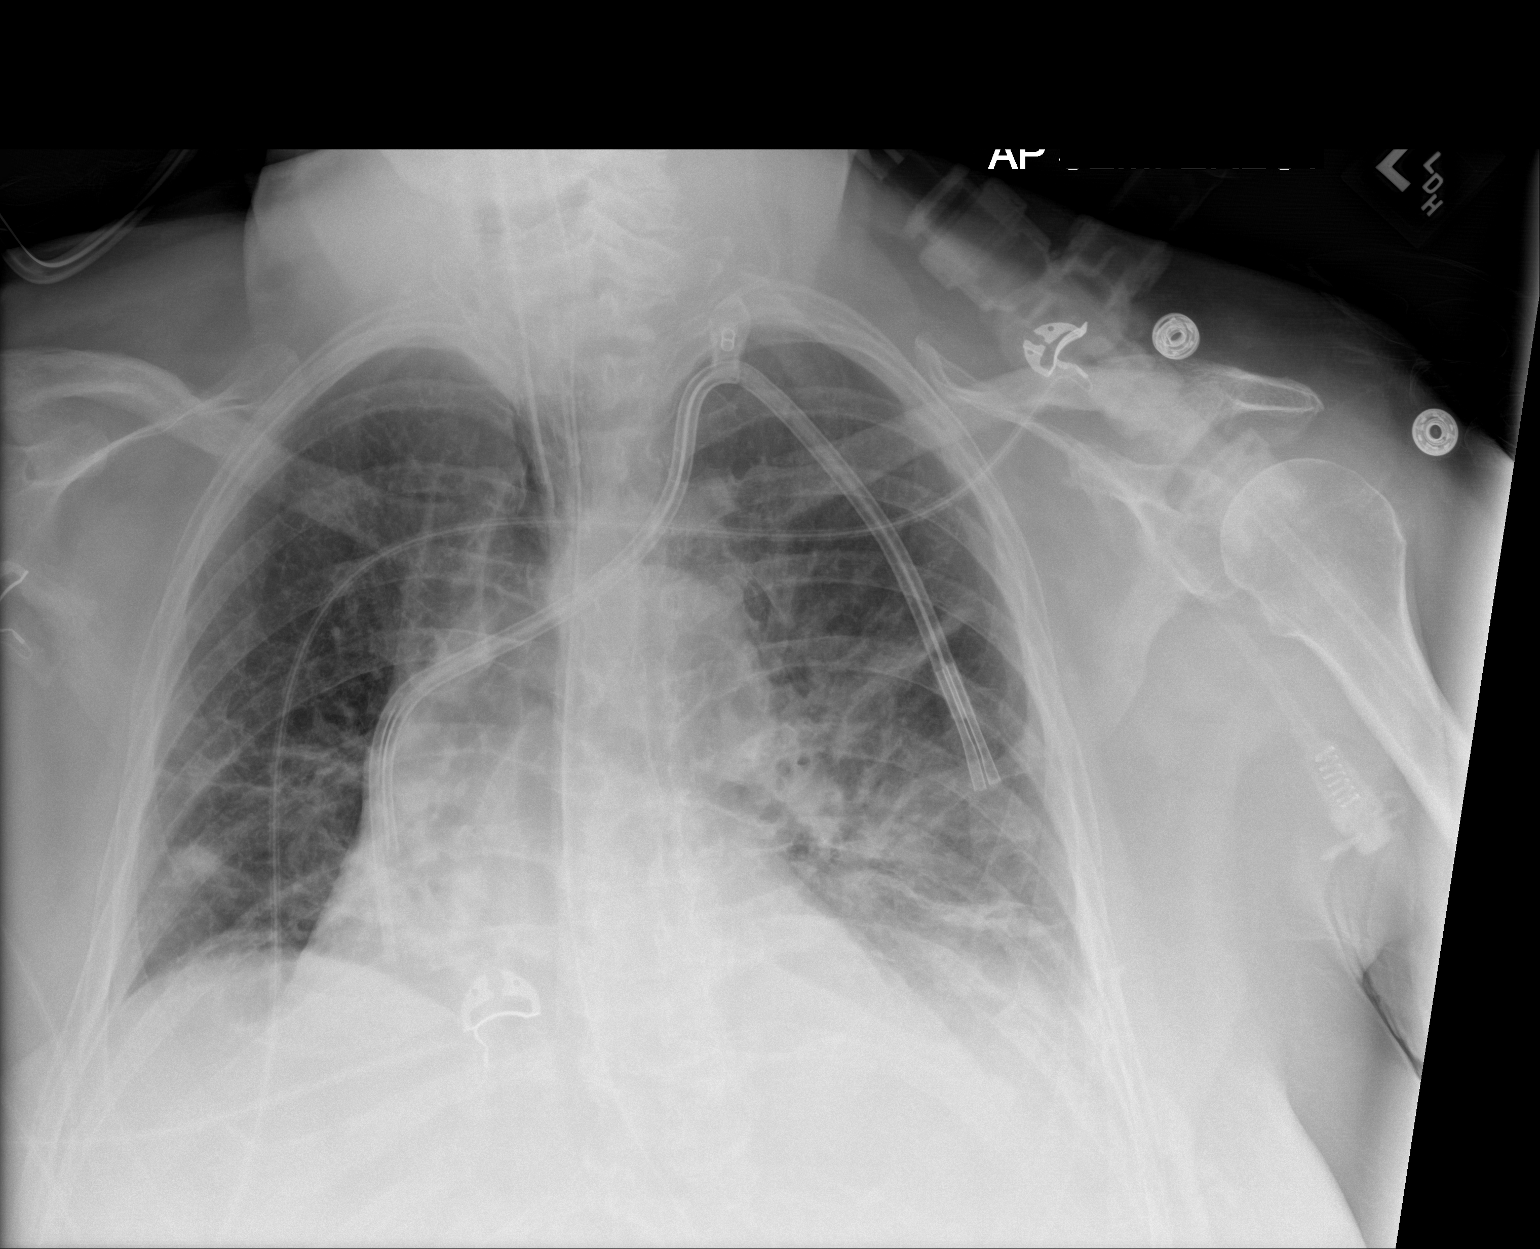

[1 of 1 positions shown; findings below may reference images not displayed]

FINDINGS: Endotracheal tube tip approximately 5.4 cm above the carina. Left
dual lumen IJ central venous catheter with the tip projecting at the
right atrium. Gastric tube courses below the diaphragm with the tip
outside the field of view. Hazy left basilar opacity, likely in part
related to layering small left pleural effusion on this semi erect
radiograph. Superimposed bibasilar opacities, including somewhat
nodular opacity in the right lower lung. No visible pleural
effusions or pneumothorax. Mild enlargement of the cardiomediastinal
silhouette, which is accentuated by AP technique.
IMPRESSION: 1. Low lung volumes with suspected small layering left pleural
effusion and bibasilar opacities that may represent atelectasis,
aspiration, and/or pneumonia.
2. Mild cardiomegaly and mild interstitial prominence, suggestive of
mild interstitial edema.
3. Rounded, nodular opacity in the right lower lung, likely related
to the above findings but warranting short interval follow-up
radiographs (preferably PA and lateral) to ensure resolution and
exclude pulmonary nodule.

## 2022-08-20 IMAGING — DX DG CHEST 1V PORT
1 series · 1 of 1 positions shown · non-contrast
Comparison: 06/30/2021.

CLINICAL DATA: Evaluate central line placement and OG tube
placement.

EXAM:
PORTABLE CHEST 1 VIEW

[chest ap]
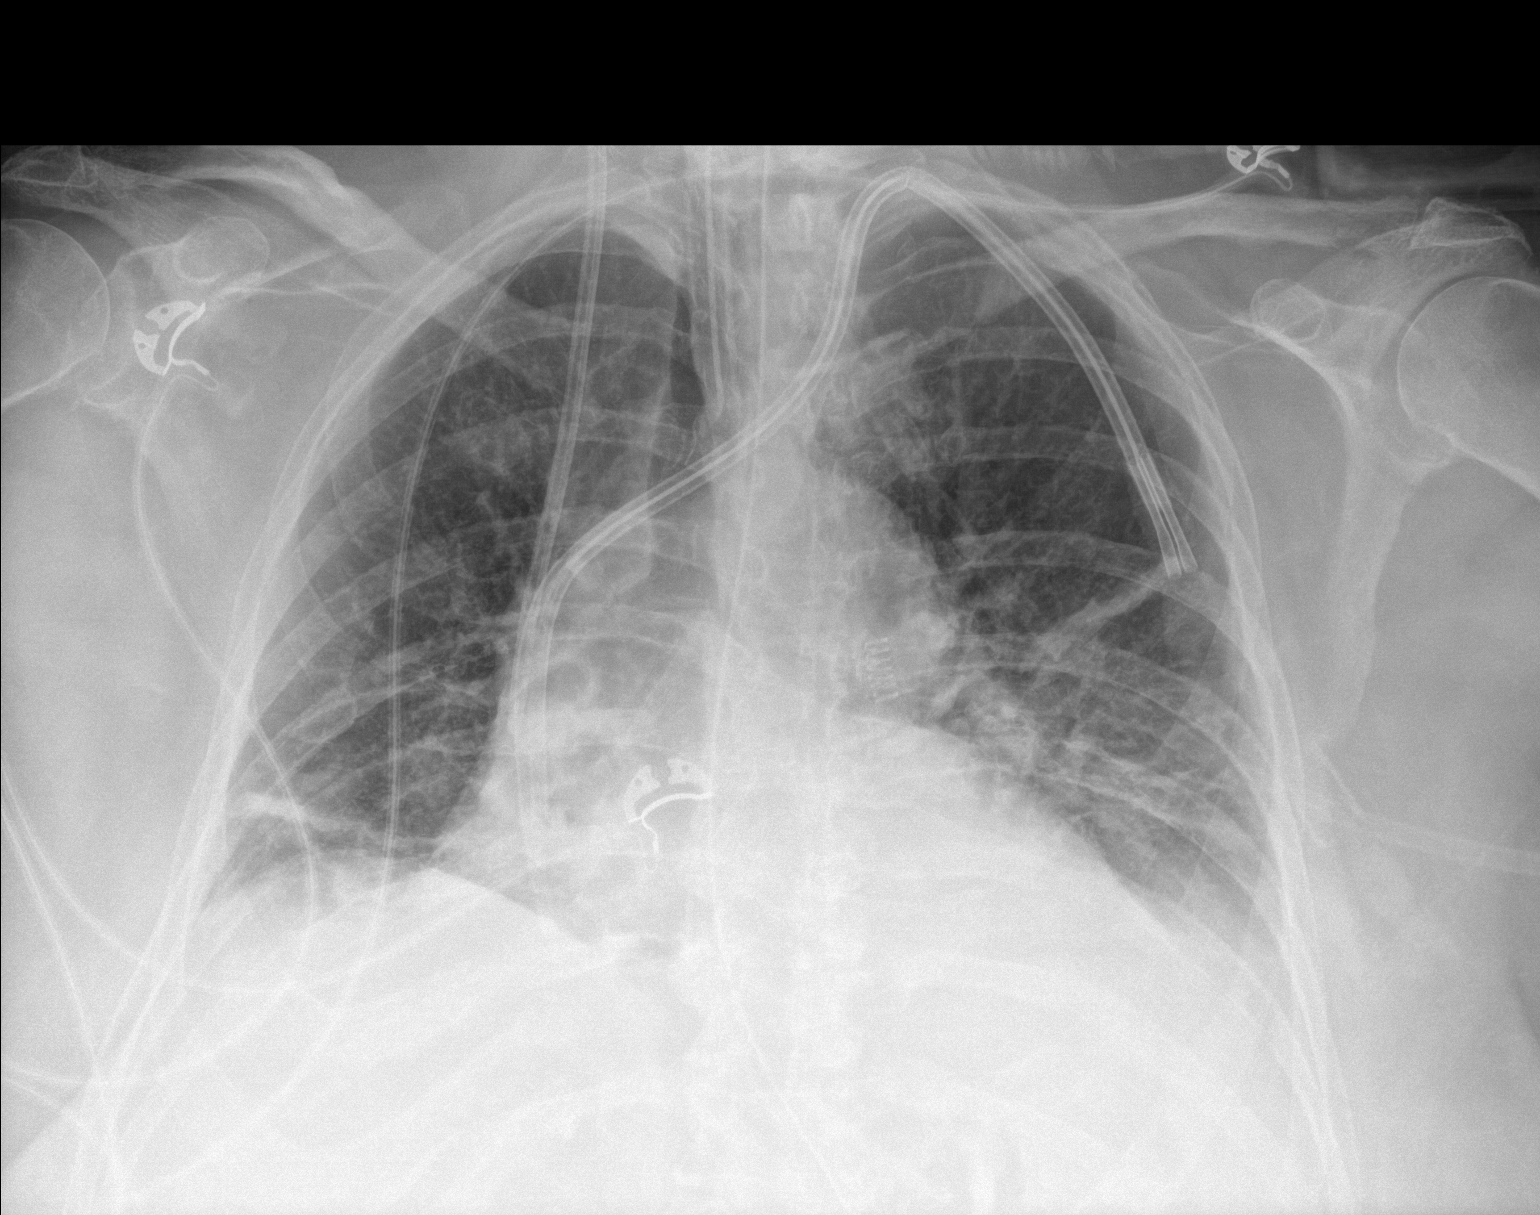

[1 of 1 positions shown; findings below may reference images not displayed]

FINDINGS: ET tube tip is in satisfactory position above the carina. There is a
left chest wall dialysis catheter with tips in the cavoatrial
junction and right atrium. New right IJ catheter tip is at the
cavoatrial junction. No pneumothorax identified. Stable
cardiomediastinal contours. Small left pleural effusion.
Subsegmental atelectasis noted in the right base. Atelectasis and
airspace opacities identified in the left base. Interstitial edema
pattern appears improved.
IMPRESSION: 1. New right IJ catheter tip is at the cavoatrial junction. No
pneumothorax identified.
2. Persistent atelectasis and airspace disease in the left lung
base. Subsegmental atelectasis noted in the left base.
3. Unchanged left pleural effusion.

## 2022-08-20 IMAGING — DX DG ABDOMEN 1V
1 series · 1 of 1 positions shown · non-contrast
Comparison: 09/20/2005.

CLINICAL DATA: OG tube placement

EXAM:
ABDOMEN - 1 VIEW

[abdomen supine]
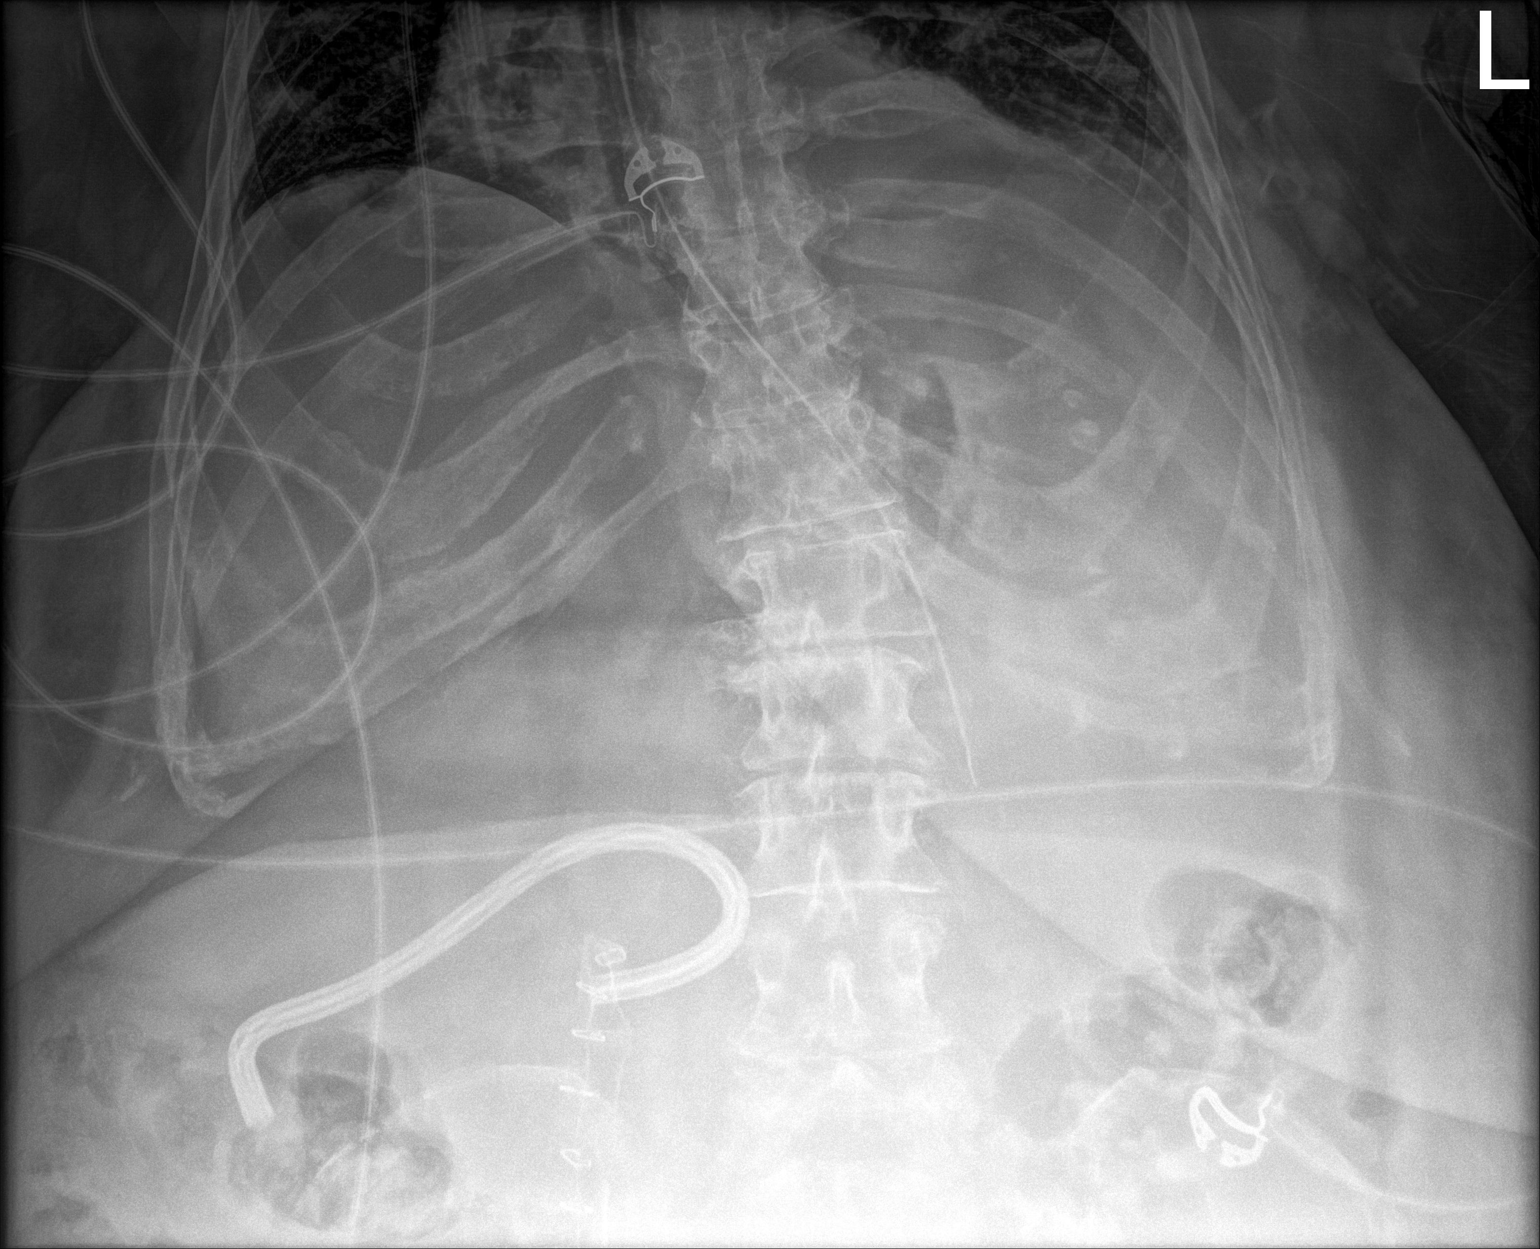

[1 of 1 positions shown; findings below may reference images not displayed]

FINDINGS: OG tube tip is in the mid stomach. Proximal side port is in the
region of the GE junction. Nonspecific bowel gas pattern with
surgical drain overlying the right abdomen.
IMPRESSION: OG tube tip is in the mid stomach with proximal side in the region
of the GE junction. Tube could be advanced 4-5 cm to ensure
positioning of the side port below the GE junction, as warranted.

## 2022-09-24 ENCOUNTER — Encounter (INDEPENDENT_AMBULATORY_CARE_PROVIDER_SITE_OTHER): Payer: Self-pay

## 2022-10-03 ENCOUNTER — Ambulatory Visit
Admission: RE | Admit: 2022-10-03 | Discharge: 2022-10-03 | Disposition: A | Discharge status: Home / Self Care | Payer: Medicare Other | Source: Ambulatory Visit | Attending: Urology | Admitting: Urology

## 2022-10-03 ENCOUNTER — Encounter: Payer: Self-pay | Admitting: Urology

## 2022-10-03 ENCOUNTER — Ambulatory Visit (INDEPENDENT_AMBULATORY_CARE_PROVIDER_SITE_OTHER): Payer: Medicare Other | Admitting: Urology

## 2022-10-03 VITALS — BP 136/76 | HR 70 | Ht 63.0 in | Wt 198.0 lb

## 2022-10-03 DIAGNOSIS — N1330 Unspecified hydronephrosis: Secondary | ICD-10-CM

## 2022-10-03 DIAGNOSIS — N189 Chronic kidney disease, unspecified: Secondary | ICD-10-CM

## 2022-10-03 NOTE — Progress Notes (Signed)
10/03/2022 1:37 PM   Heather Lopez [birth year] [id number]  Referring provider: [name], PA-C [address] [place],  [name] [address]  Chief Complaint  Patient presents with   Urinary Frequency    HPI: 76 y.o. female presents for 40-monthfollow-up.  Complicated urologic history.  Prior history of stone disease and recurrent UTI.  Had sepsis secondary to perforated duodenal ulcer August 2022 and subsequently found to have a UPJ calculus with obstruction felt to be a secondary source of sepsis.  Initially underwent stenting.  She underwent bilateral ureteroscopy/stone removal.  She was admitted for recurrent UTI on several occasions.  Although stone free had bilateral hydronephrosis which was felt to be nonobstructive.  She had an indwelling white right ureteral stent which was removed and has had rising creatinine after right stent removal.  She elected chronic stent drainage.  The etiology of her obstruction is unknown LUTS follow-up ureteroscopy was negative for stricture or stone.  Stent was placed 07/03/2022 and she states she has been doing quite well and has no complaints.  She has not had a creatinine checked since her stent was placed.  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 06/2021   Osteoarthritis    Perforated [place] ulcer ([facility] [place] 06/27/2021   Pyonephrosis 06/2021   left   Septic shock ([place] 06/27/2021   Type 2 diabetes mellitus with stage 5 chronic kidney disease ([facility]    Vertigo     Surgical History: Past Surgical History:  Procedure Laterality Date   CATARACT EXTRACTION, BILATERAL Bilateral 2019   COLONOSCOPY WITH PROPOFOL N/A 08/19/2015   Procedure:  COLONOSCOPY WITH PROPOFOL;  Surgeon: DLucilla Lame MD;  [facility]  Service: Endoscopy;  Laterality: N/A;  diabetic - insulin   CYSTOSCOPY W/ RETROGRADES Bilateral 12/22/2019   Procedure: CYSTOSCOPY WITH RETROGRADE PYELOGRAM;  Surgeon: Heather Sons MD;  Location: [facility];  Service: Urology;  Laterality: Bilateral;   CYSTOSCOPY W/ URETERAL STENT PLACEMENT Bilateral 07/05/2021   Procedure: CYSTOSCOPY WITH RETROGRADE PYELOGRAM/BILATERAL URETERAL STENT PLACEMENT;  Surgeon: Heather Sons MD;  Location: [facility];  Service: Urology;  Laterality: Bilateral;   CYSTOSCOPY W/ URETERAL STENT PLACEMENT Bilateral 12/26/2021   Procedure: CYSTOSCOPY WITH STENT REMOVAL;  Surgeon: Heather Sons MD;  Location: [facility];  Service: Urology;  Laterality: Bilateral;   CYSTOSCOPY W/ URETERAL STENT PLACEMENT Right 03/13/2022   Procedure: CYSTOSCOPY WITH RETROGRADE PYELOGRAM/URETERAL STENT PLACEMENT;  Surgeon: Heather Sons MD;  Location: [facility];  Service: Urology;  Laterality: Right;   CYSTOSCOPY W/ URETERAL STENT REMOVAL Right 05/08/2022   Procedure: CYSTOSCOPY WITH STENT REMOVAL;  Surgeon: Heather Sons MD;  Location: [facility];  Service: Urology;  Laterality: Right;   CYSTOSCOPY WITH BIOPSY N/A 12/22/2019   Procedure: CYSTOSCOPY WITH bladder BIOPSY;  Surgeon: Heather Sons MD;  Location: [facility];  Service: Urology;  Laterality: N/A;   CYSTOSCOPY WITH BIOPSY  03/13/2022   Procedure: CYSTOSCOPY WITH BLADDER BIOPSY;  Surgeon: Heather Sons MD;  Location: [facility];  Service: Urology;;   CYSTOSCOPY WITH STENT PLACEMENT Right 12/22/2019   Procedure: CYSTOSCOPY WITH  STENT PLACEMENT;  Surgeon: Abbie Sons, MD;  Location: [facility];  Service: Urology;  Laterality: Right;   CYSTOSCOPY WITH STENT PLACEMENT Right 07/03/2022   Procedure: CYSTOSCOPY/RIGHT URETEROSCOPY WITH STENT PLACEMENT;  Surgeon: Abbie Sons, MD;  Location: [facility];  Service: Urology;  Laterality: Right;    CYSTOSCOPY WITH URETEROSCOPY Bilateral 12/22/2019   Procedure: CYSTOSCOPY WITH URETEROSCOPY;  Surgeon: Abbie Sons, MD;  Location: [facility];  Service: Urology;  Laterality: Bilateral;   CYSTOSCOPY/URETEROSCOPY/HOLMIUM LASER/STENT PLACEMENT Right 12/22/2019   Procedure: CYSTOSCOPY/URETEROSCOPY/HOLMIUM LASER/STENT PLACEMENT;  Surgeon: Abbie Sons, MD;  Location: [facility];  Service: Urology;  Laterality: Right;   CYSTOSCOPY/URETEROSCOPY/HOLMIUM LASER/STENT PLACEMENT Bilateral 12/08/2021   Procedure: CYSTOSCOPY/URETEROSCOPY/HOLMIUM LASER/STENT PLACEMENT;  Surgeon: Heather Co, MD;  Location: [facility];  Service: Urology;  Laterality: Bilateral;   DIALYSIS/PERMA CATHETER INSERTION N/A 06/29/2021   Procedure: DIALYSIS/PERMA CATHETER INSERTION;  Surgeon: Algernon Huxley, MD;  Location: [facility];  Service: Cardiovascular;  Laterality: N/A;   DIALYSIS/PERMA CATHETER INSERTION N/A 07/13/2021   Procedure: DIALYSIS/PERMA CATHETER INSERTION;  Surgeon: Algernon Huxley, MD;  Location: [facility];  Service: Cardiovascular;  Laterality: N/A;   EYE SURGERY     IR GASTROSTOMY TUBE MOD SED  08/01/2021   LAPAROTOMY N/A 06/29/2021   Procedure: EXPLORATORY LAPAROTOMY WITH REPAIR OF DUODENAL PERFORATION;  Surgeon: Heather Ree, MD;  Location: [facility];  Service: General;  Laterality: N/A;   POLYPECTOMY  08/19/2015   Procedure: POLYPECTOMY;  Surgeon: Heather Lame, MD;  Location: [facility];  Service: Endoscopy;;   TUBAL LIGATION  1992    Home Medications:  Allergies as of 10/03/2022       Reactions   Contrast Media  [iodinated Contrast Media] Anaphylaxis   Iodine Swelling   (IV only) - angioedema        Medication List        Accurate as of October 03, 2022  1:37 PM. If you have any questions, ask your nurse or doctor.          aspirin EC 81 MG tablet Take 81 mg by mouth daily.   atorvastatin 20 MG tablet Commonly known as: LIPITOR Take 20 mg by mouth daily.    buPROPion 150 MG 24 hr tablet Commonly known as: WELLBUTRIN XL Take 150 mg by mouth daily.   busPIRone 5 MG tablet Commonly known as: BUSPAR Take 5 mg by mouth daily.   cloNIDine 0.1 MG tablet Commonly known as: CATAPRES Take 1 tablet (0.1 mg total) by mouth 2 (two) times daily.   ferrous sulfate 325 (65 FE) MG tablet Take 325 mg by mouth daily with breakfast.   Gemtesa 75 MG Tabs Generic drug: Vibegron Take 75 mg by mouth daily.   insulin aspart 100 UNIT/ML injection Commonly known as: novoLOG Inject 10 Units into the skin 3 (three) times daily before meals.   insulin detemir 100 UNIT/ML injection Commonly known as: LEVEMIR Inject 0.3 mLs (30 Units total) into the skin 2 (two) times daily.   lansoprazole 30 MG capsule Commonly known as: PREVACID Take 30 mg by mouth at bedtime.   loperamide 2 MG tablet Commonly known as: IMODIUM A-D Take 2 mg by mouth every 6 (six) hours as needed for diarrhea or loose stools.   metoprolol tartrate 100 MG tablet Commonly known as: LOPRESSOR Take 1 tablet (100 mg total) by mouth 2 (two) times daily.   multivitamin with minerals Tabs tablet Take 1 tablet by mouth daily.   polyethylene  glycol 17 g packet Commonly known as: MIRALAX / GLYCOLAX Take 17 g by mouth daily as needed for mild constipation or moderate constipation.   Premarin vaginal cream Generic drug: conjugated estrogens Apply one pea-sized amount around the opening of the urethra daily for 2 weeks, then 3 times weekly moving forward.   senna-docusate 8.6-50 MG tablet Commonly known as: Senokot-S Take 1 tablet by mouth at bedtime as needed for mild constipation or moderate constipation.   Trulicity 1.5 AY/3.0ZS Sopn Generic drug: Dulaglutide Inject 1.5 mg into the skin once a week. Sunday   Uribel 118 MG Caps Take 1 capsule (118 mg total) by mouth 3 (three) times daily as needed (Urinary frequency, urgency, burning).        Allergies:  Allergies  Allergen  Reactions   Contrast Media  [Iodinated Contrast Media] Anaphylaxis   Iodine Swelling    (IV only) - angioedema    Family History: Family History  Problem Relation Age of Onset   Diabetes Father    Cancer Father        lung cancer   Diabetes Brother    Stroke Mother     Social History:  reports that she quit smoking about 50 years ago. Her smoking use included cigarettes. She has a 4.50 pack-year smoking history. She has never used smokeless tobacco. She reports that she does not drink alcohol and does not use drugs.   Physical Exam: BP 136/76   Pulse 70   Ht '5\' 3"'$  (1.6 m)   Wt 198 lb (89.8 kg)   BMI 35.07 kg/m   Constitutional:  Alert and oriented, No acute distress. HEENT: [place] AT Respiratory: Normal respiratory effort, no increased work of breathing. Psychiatric: Normal mood and affect.   Pertinent Imaging: Images of a KUB performed today were personally reviewed and interpreted.  Stent is in good position without encrustation.  No calcifications suspicious for urinary tract stones are identified.   Assessment & Plan:   76 y.o. female with obstructive right hydronephrosis of undetermined etiology Overall she feels well; stent is in good position Basic metabolic panel drawn today Schedule cystoscopy with stent exchange February 2024   Abbie Sons, MD  [facility] [address] [place], [place] [address] [phone]

## 2022-10-04 ENCOUNTER — Encounter: Payer: Self-pay | Admitting: Urology

## 2022-10-04 LAB — BASIC METABOLIC PANEL WITH GFR
BUN/Creatinine Ratio: 15 (ref 12–28)
BUN: 39 mg/dL — ABNORMAL HIGH (ref 8–27)
CO2: 15 mmol/L — ABNORMAL LOW (ref 20–29)
Calcium: 9.2 mg/dL (ref 8.7–10.3)
Chloride: 105 mmol/L (ref 96–106)
Creatinine, Ser: 2.62 mg/dL — ABNORMAL HIGH (ref 0.57–1.00)
Glucose: 214 mg/dL — ABNORMAL HIGH (ref 70–99)
Potassium: 4.4 mmol/L (ref 3.5–5.2)
Sodium: 138 mmol/L (ref 134–144)
eGFR: 18 mL/min/1.73 — ABNORMAL LOW

## 2022-10-08 ENCOUNTER — Encounter: Payer: Self-pay | Admitting: Family Medicine

## 2022-10-22 ENCOUNTER — Other Ambulatory Visit: Payer: Self-pay | Admitting: Urology

## 2022-10-22 DIAGNOSIS — N1330 Unspecified hydronephrosis: Secondary | ICD-10-CM

## 2022-10-22 NOTE — Progress Notes (Signed)
Surgical Physician Order Form [facility]  * Scheduling expectation : February 2024  *Length of Case: 30 minutes  *Clearance needed: Per Gaspar Bidding  *Anticoagulation Instructions: N/A  *Aspirin Instructions: Ok to continue Aspirin  *Post-op visit Date/Instructions:  3 month follow up with PA/KUB prior  *Diagnosis: Right Hydronephrosis  *Procedure: Right  Cysto w/stent exchange (91028)   Additional orders: N/A  -Admit type: OUTpatient  -Anesthesia: Choice  -VTE Prophylaxis Standing Order SCD's       Other:   -Standing Lab Orders Per Anesthesia    Lab other: UA&Urine Culture  -Standing Test orders EKG/Chest x-ray per Anesthesia       Test other:   - Medications:   Based on preop urine culture  -Other orders:  N/A

## 2022-10-30 ENCOUNTER — Telehealth: Payer: Self-pay

## 2022-10-30 NOTE — Progress Notes (Signed)
   [facility]  Surgery Date: Date: 01/01/2023  Surgeon: Dr. John Giovanni, MD  Inpt ( No  )   Outpt (Yes)   Obs ( No  )   Diagnosis: N13.30 Right Hydronephrosis  -CPT: 63943  Surgery: Right Cystoscopy with Stent Exchange  Stop Anticoagulations: No  Cardiac/Medical/Pulmonary Clearance needed: no  *Orders entered into EPIC  Date: 10/30/22   *Case booked in EPIC  Date: 10/26/2022  *Notified pt of Surgery: 10/26/2022  PRE-OP UA & CX: Date: yes will obtain in clinic on 12/20/2022  *Placed into Prior Authorization Work Fabio Bering Date: 10/30/22  Assistant/laser/rep:No

## 2022-10-30 NOTE — Telephone Encounter (Signed)
I spoke with Heather Lopez . We have discussed possible surgery dates and Tuesday February 6th, 2024 was agreed upon by all parties. Patient given information about surgery date, what to expect pre-operatively and post operatively.  We discussed that a Pre-Admission Testing office will be calling to set up the pre-op visit that will take place prior to surgery, and that these appointments are typically done over the phone with a Pre-Admissions RN. Informed patient that our office will communicate any additional care to be provided after surgery. Patients questions or concerns were discussed during our call. Advised to call our office should there be any additional information, questions or concerns that arise. Patient verbalized understanding.

## 2022-12-08 ENCOUNTER — Other Ambulatory Visit: Payer: Self-pay

## 2022-12-08 ENCOUNTER — Emergency Department
Admission: EM | Admit: 2022-12-08 | Discharge: 2022-12-08 | Disposition: A | Discharge status: Home / Self Care | Payer: Medicare Other | Attending: Emergency Medicine | Admitting: Emergency Medicine

## 2022-12-08 DIAGNOSIS — I129 Hypertensive chronic kidney disease with stage 1 through stage 4 chronic kidney disease, or unspecified chronic kidney disease: Secondary | ICD-10-CM | POA: Diagnosis not present

## 2022-12-08 DIAGNOSIS — E1122 Type 2 diabetes mellitus with diabetic chronic kidney disease: Secondary | ICD-10-CM | POA: Insufficient documentation

## 2022-12-08 DIAGNOSIS — R319 Hematuria, unspecified: Secondary | ICD-10-CM | POA: Diagnosis present

## 2022-12-08 DIAGNOSIS — N390 Urinary tract infection, site not specified: Secondary | ICD-10-CM | POA: Diagnosis not present

## 2022-12-08 DIAGNOSIS — N183 Chronic kidney disease, stage 3 unspecified: Secondary | ICD-10-CM | POA: Insufficient documentation

## 2022-12-08 LAB — COMPREHENSIVE METABOLIC PANEL
ALT: 22 U/L (ref 0–44)
AST: 34 U/L (ref 15–41)
Albumin: 3.2 g/dL — ABNORMAL LOW (ref 3.5–5.0)
Alkaline Phosphatase: 154 U/L — ABNORMAL HIGH (ref 38–126)
Anion gap: 7 (ref 5–15)
BUN: 52 mg/dL — ABNORMAL HIGH (ref 8–23)
CO2: 20 mmol/L — ABNORMAL LOW (ref 22–32)
Calcium: 8.9 mg/dL (ref 8.9–10.3)
Chloride: 109 mmol/L (ref 98–111)
Creatinine, Ser: 2.77 mg/dL — ABNORMAL HIGH (ref 0.44–1.00)
GFR, Estimated: 17 mL/min — ABNORMAL LOW (ref >60)
Glucose, Bld: 134 mg/dL — ABNORMAL HIGH (ref 70–99)
Potassium: 5.1 mmol/L (ref 3.5–5.1)
Sodium: 136 mmol/L (ref 135–145)
Total Bilirubin: 1 mg/dL (ref 0.3–1.2)
Total Protein: 6.7 g/dL (ref 6.5–8.1)

## 2022-12-08 LAB — URINALYSIS, ROUTINE W REFLEX MICROSCOPIC
Bilirubin Urine: NEGATIVE
Glucose, UA: 50 mg/dL — AB
Ketones, ur: NEGATIVE mg/dL
Nitrite: POSITIVE — AB
Protein, ur: >=300 mg/dL — AB
RBC / HPF: >50 RBC/hpf — ABNORMAL HIGH (ref 0–5)
Specific Gravity, Urine: 1.01 (ref 1.005–1.030)
WBC, UA: >50 WBC/hpf — ABNORMAL HIGH (ref 0–5)
pH: 6 (ref 5.0–8.0)

## 2022-12-08 LAB — CBC
HCT: 39 % (ref 36.0–46.0)
Hemoglobin: 12.1 g/dL (ref 12.0–15.0)
MCH: 28.3 pg (ref 26.0–34.0)
MCHC: 31 g/dL (ref 30.0–36.0)
MCV: 91.3 fL (ref 80.0–100.0)
Platelets: 235 10*3/uL (ref 150–400)
RBC: 4.27 MIL/uL (ref 3.87–5.11)
RDW: 16.3 % — ABNORMAL HIGH (ref 11.5–15.5)
WBC: 6.8 10*3/uL (ref 4.0–10.5)
nRBC: 0 % (ref 0.0–0.2)

## 2022-12-08 LAB — LIPASE, BLOOD: Lipase: 62 U/L — ABNORMAL HIGH (ref 11–51)

## 2022-12-08 MED ORDER — SODIUM CHLORIDE 0.9 % IV BOLUS
1000.0000 mL | Freq: Once | INTRAVENOUS | Status: AC
  Administered 2022-12-08: 1000 mL via INTRAVENOUS

## 2022-12-08 MED ORDER — SODIUM CHLORIDE 0.9 % IV SOLN
1.0000 g | Freq: Once | INTRAVENOUS | Status: AC
  Administered 2022-12-08: 1 g via INTRAVENOUS
  Filled 2022-12-08: qty 10

## 2022-12-08 MED ORDER — CEFDINIR 300 MG PO CAPS: 300.0000 mg | ORAL_CAPSULE | Freq: Two times a day (BID) | ORAL | 0 refills | Status: DC

## 2022-12-08 NOTE — ED Notes (Signed)
Patient assisted via private wheelchair to hallway bathroom by husband. Letitia Neri, PA C aware of fluids infused and declined any further.

## 2022-12-08 NOTE — Discharge Instructions (Signed)
Call make an appointment for follow-up with Dr. Bernardo Heater for your urinary tract infection.  A prescription for cefdinir was sent to the pharmacy to you for you to begin taking twice a day for the next 10 days.  Increase fluids to stay hydrated.  Return to the emergency department if any severe worsening of your symptoms such as increased pain, fever, chills, vomiting or inability to take the antibiotic.

## 2022-12-08 NOTE — ED Notes (Signed)
Patient given call light. Patient declined a Purewick at this time.

## 2022-12-08 NOTE — ED Notes (Signed)
NS paused while husband assists the patient to the bathroom.

## 2022-12-08 NOTE — ED Triage Notes (Signed)
Pt here with blood in her urine since yesterday. Pt states now she is having stinging when urinating and back pain. Pt has 69 week old stent in her right kidney that should be removed soon. Pt states pain intermittently.

## 2022-12-08 NOTE — ED Provider Notes (Signed)
[facility] Provider Note    Event Date/Time   First MD Initiated Contact with Patient 12/08/22 [id number]     (approximate)   History   Hematuria   HPI  Heather Lopez is a 77 y.o. female   presents to the ED with complaint of hematuria since yesterday.  Patient states that she has history of urinary tract infections and also reports that she has had some dysuria and frequency in the last 24 hours.  She denies any back pain.  Patient also has a stent in her right kidney and she follows Dr. Bernardo Heater who is her urologist.  Patient denies any fever, chills, nausea or vomiting.  Patient has a history of hypertension with chronic kidney disease stage III, atrial flutter with rapid ventricular response, ischemia of the myocardium, hypertension, respiratory failure, IBS, diabetes type 2, bilateral nephrolithiasis, renal abscess.       Physical Exam   Triage Vital Signs: ED Triage Vitals  Enc Vitals Group     BP 12/08/22 0933 (!) 188/80     Pulse Rate 12/08/22 0933 66     Resp 12/08/22 0933 16     Temp 12/08/22 0933 98.2 F (36.8 C)     Temp Source 12/08/22 0933 Oral     SpO2 12/08/22 0933 95 %     Weight 12/08/22 0933 197 lb 15.6 oz (89.8 kg)     Height 12/08/22 0933 '5\' 3"'$  (1.6 m)     Head Circumference --      Peak Flow --      Pain Score 12/08/22 0932 9     Pain Loc --      Pain Edu? --      Excl. in [place]? --     Most recent vital signs: Vitals:   12/08/22 0933 12/08/22 1247  BP: (!) 188/80 (!) 175/80  Pulse: 66 66  Resp: 16 16  Temp: 98.2 F (36.8 C)   SpO2: 95% 100%     General: Awake, no distress.  Pleasant, talkative, cooperative. CV:  Good peripheral perfusion.  Resp:  Normal effort.  Lungs are clear bilaterally. Abd:  No distention.  Soft, nontender, bowel sounds present x 4 quadrants.  No flank tenderness. Other:     ED Results / Procedures / Treatments   Labs (all labs ordered are listed, but only abnormal results are displayed) Labs  Reviewed  LIPASE, BLOOD - Abnormal; Notable for the following components:      Result Value   Lipase 62 (*)    All other components within normal limits  COMPREHENSIVE METABOLIC PANEL - Abnormal; Notable for the following components:   CO2 20 (*)    Glucose, Bld 134 (*)    BUN 52 (*)    Creatinine, Ser 2.77 (*)    Albumin 3.2 (*)    Alkaline Phosphatase 154 (*)    GFR, Estimated 17 (*)    All other components within normal limits  CBC - Abnormal; Notable for the following components:   RDW 16.3 (*)    All other components within normal limits  URINALYSIS, ROUTINE W REFLEX MICROSCOPIC - Abnormal; Notable for the following components:   Color, Urine AMBER (*)    APPearance TURBID (*)    Glucose, UA 50 (*)    Hgb urine dipstick LARGE (*)    Protein, ur >=300 (*)    Nitrite POSITIVE (*)    Leukocytes,Ua LARGE (*)    RBC / HPF >50 (*)  WBC, UA >50 (*)    Bacteria, UA MANY (*)    All other components within normal limits      PROCEDURES:  Critical Care performed:   Procedures   MEDICATIONS ORDERED IN ED: Medications  sodium chloride 0.9 % bolus 1,000 mL (0 mLs Intravenous Stopped 12/08/22 1241)  cefTRIAXone (ROCEPHIN) 1 g in sodium chloride 0.9 % 100 mL IVPB (0 g Intravenous Stopped 12/08/22 1240)     IMPRESSION / MDM / ASSESSMENT AND PLAN / ED COURSE  I reviewed the triage vital signs and the nursing notes.   Differential diagnosis includes, but is not limited to, acute cystitis, acute urinary tract infection, chronic kidney disease.  77 year old female presents to the ED with complaint of dysuria and frequency that started yesterday along with noted hematuria.  Patient has a history of urinary tract infections along with chronic kidney disease stage III and is followed by Dr. Bernardo Heater in urology.  Urinalysis was consistent with a urinary tract infection with greater than 50 RBCs and WBCs with many bacteria.  A urine culture was ordered.  CMP showed a BUN of 52 and  creatinine of 2.77 and looking back through her past lab work she has had a creatinine as high as 4.9 in August 2023.  Patient was given some fluids while in the ED along with Rocephin 1 g IV.  Patient was feeling better prior to discharge and states that she will continue with her regular medications and call and make an appoint with Dr. Bernardo Heater.  A prescription for cefdinir 300 mg twice daily was sent to the pharmacy and patient is aware that a urine culture was obtained that Dr. Bernardo Heater also can access for that information.  She was made aware that she should return to the emergency department if any worsening of her symptoms such as back pain, fever, chills, nausea, vomiting or inability to take the antibiotic.      Patient's presentation is most consistent with acute complicated illness / injury requiring diagnostic workup.  FINAL CLINICAL IMPRESSION(S) / ED DIAGNOSES   Final diagnoses:  Urinary tract infection with hematuria, site unspecified     Rx / DC Orders   ED Discharge Orders          Ordered    cefdinir (OMNICEF) 300 MG capsule  2 times daily        12/08/22 1242             Note:  This document was prepared using Dragon voice recognition software and may include unintentional dictation errors.   Johnn Hai, PA-C 12/08/22 1500    Nance Pear, MD 12/08/22 1524

## 2022-12-20 ENCOUNTER — Telehealth: Payer: Self-pay | Admitting: Urology

## 2022-12-20 ENCOUNTER — Other Ambulatory Visit: Payer: Medicare Other

## 2022-12-20 DIAGNOSIS — N1330 Unspecified hydronephrosis: Secondary | ICD-10-CM

## 2022-12-20 LAB — URINALYSIS, COMPLETE
Bilirubin, UA: NEGATIVE
Glucose, UA: NEGATIVE
Ketones, UA: NEGATIVE
Nitrite, UA: NEGATIVE
Specific Gravity, UA: 1.02 (ref 1.005–1.030)
Urobilinogen, Ur: 0.2 mg/dL (ref 0.2–1.0)
pH, UA: 6 (ref 5.0–7.5)

## 2022-12-20 LAB — MICROSCOPIC EXAMINATION: WBC, UA: >30 /hpf — AB (ref 0–5)

## 2022-12-20 NOTE — Telephone Encounter (Signed)
Pt has appt tomorrow at pre admit and wants to know if she can get more samples of Gemtesa.

## 2022-12-20 NOTE — Telephone Encounter (Signed)
  Spoke to patient and informed her a RX was sent to her pharmacy in August for Heather Lopez. She was unaware and will call the pharmacy to get the medication filled.

## 2022-12-21 ENCOUNTER — Encounter
Admission: RE | Admit: 2022-12-21 | Discharge: 2022-12-21 | Disposition: A | Discharge status: Home / Self Care | Payer: Medicare Other | Source: Ambulatory Visit | Attending: Urology | Admitting: Urology

## 2022-12-21 VITALS — BP 107/54 | HR 71 | Temp 98.4°F | Resp 18 | Ht 63.0 in | Wt 210.0 lb

## 2022-12-21 DIAGNOSIS — I498 Other specified cardiac arrhythmias: Secondary | ICD-10-CM | POA: Diagnosis not present

## 2022-12-21 DIAGNOSIS — I10 Essential (primary) hypertension: Secondary | ICD-10-CM

## 2022-12-21 DIAGNOSIS — Z01818 Encounter for other preprocedural examination: Secondary | ICD-10-CM | POA: Insufficient documentation

## 2022-12-21 DIAGNOSIS — N183 Chronic kidney disease, stage 3 unspecified: Secondary | ICD-10-CM | POA: Insufficient documentation

## 2022-12-21 DIAGNOSIS — E1165 Type 2 diabetes mellitus with hyperglycemia: Secondary | ICD-10-CM

## 2022-12-21 DIAGNOSIS — I129 Hypertensive chronic kidney disease with stage 1 through stage 4 chronic kidney disease, or unspecified chronic kidney disease: Secondary | ICD-10-CM | POA: Diagnosis not present

## 2022-12-21 DIAGNOSIS — Z01810 Encounter for preprocedural cardiovascular examination: Secondary | ICD-10-CM | POA: Diagnosis not present

## 2022-12-21 HISTORY — DX: Cardiac arrhythmia, unspecified: I49.9

## 2022-12-21 HISTORY — DX: Gastro-esophageal reflux disease without esophagitis: K21.9

## 2022-12-21 LAB — CBC
HCT: 37.3 % (ref 36.0–46.0)
Hemoglobin: 11.8 g/dL — ABNORMAL LOW (ref 12.0–15.0)
MCH: 28.9 pg (ref 26.0–34.0)
MCHC: 31.6 g/dL (ref 30.0–36.0)
MCV: 91.2 fL (ref 80.0–100.0)
Platelets: 217 10*3/uL (ref 150–400)
RBC: 4.09 MIL/uL (ref 3.87–5.11)
RDW: 16.1 % — ABNORMAL HIGH (ref 11.5–15.5)
WBC: 7.8 10*3/uL (ref 4.0–10.5)
nRBC: 0 % (ref 0.0–0.2)

## 2022-12-21 LAB — BASIC METABOLIC PANEL
Anion gap: 8 (ref 5–15)
BUN: 49 mg/dL — ABNORMAL HIGH (ref 8–23)
CO2: 22 mmol/L (ref 22–32)
Calcium: 9 mg/dL (ref 8.9–10.3)
Chloride: 108 mmol/L (ref 98–111)
Creatinine, Ser: 2.92 mg/dL — ABNORMAL HIGH (ref 0.44–1.00)
GFR, Estimated: 16 mL/min — ABNORMAL LOW (ref >60)
Glucose, Bld: 138 mg/dL — ABNORMAL HIGH (ref 70–99)
Potassium: 4.6 mmol/L (ref 3.5–5.1)
Sodium: 138 mmol/L (ref 135–145)

## 2022-12-21 NOTE — Patient Instructions (Addendum)
Your procedure is scheduled on: Tuesday January 01, 2023. Report to Day Surgery inside [place] 2nd floor, stop by registration desk before getting on elevator. To find out your arrival time please call [phone] between 1PM - 3PM on Monday December 31, 2022.  Remember: Instructions that are not followed completely may result in serious medical risk,  up to and including death, or upon the discretion of your surgeon and anesthesiologist your  surgery may need to be rescheduled.     _X__ 1. Do not eat food or drink fluids after midnight the night before your procedure.                 No chewing gum or hard candies.   __X__2.  On the morning of surgery brush your teeth with toothpaste and water, you                may rinse your mouth with mouthwash if you wish.  Do not swallow any toothpaste or mouthwash.     _X__ 3.  No Alcohol for 24 hours before or after surgery.   _X__ 4.  Do Not Smoke or use e-cigarettes For 24 Hours Prior to Your Surgery.                 Do not use any chewable tobacco products for at least 6 hours prior to                 Surgery.  _X__  5.  Do not use any recreational drugs (marijuana, cocaine, heroin, ecstasy, MDMA or other)                For at least one week prior to your surgery.  Combination of these drugs with anesthesia                May have life threatening results.  ____  6.  Bring all medications with you on the day of surgery if instructed.   _X__ 7.  Notify your doctor if there is any change in your medical condition      (cold, fever, infections).     Do not wear jewelry, make-up, hairpins, clips or nail polish. Do not wear lotions, powders, or perfumes. You may wear deodorant. Do not shave 48 hours prior to surgery. Men may shave face and neck. Do not bring valuables to the hospital.    [facility] is not responsible for any belongings or valuables.  Contacts, dentures or bridgework may not be worn into  surgery. Leave your suitcase in the car. After surgery it may be brought to your room. For patients admitted to the hospital, discharge time is determined by your treatment team.   Patients discharged the day of surgery will not be allowed to drive home.   Make arrangements for someone to be with you for the first 24 hours of your Same Day Discharge   __X__ Take these medicines the morning of surgery with A SIP OF WATER:    1. buPROPion (WELLBUTRIN XL) 150 MG   2. busPIRone (BUSPAR) 5 MG   3. cloNIDine (CATAPRES) 0.1 MG   4. lansoprazole (PREVACID) 30 MG   5. metoprolol tartrate (LOPRESSOR) 100 MG   6.  ____ Fleet Enema (as directed)   ____ Use CHG Soap (or wipes) as directed  ____ Use Benzoyl Peroxide Gel as instructed  ____ Use inhalers on the day of surgery  __X__ StopDulaglutide (TRULICITY) 1.5 FB/5.1WC SOPN  1 week  prior to surgery    __X__ Take 1/2 of usual insulin dose the night before surgery. insulin glargine (LANTUS) 100 UNIT/ML injection   NO insulin the morning of surgery.   __X__ One Week prior to surgery- Stop Anti-inflammatories such as Ibuprofen, Aleve, Advil, Motrin, meloxicam (MOBIC), diclofenac, etodolac, ketorolac, Toradol, Daypro, piroxicam, Goody's or BC powders. OK TO USE TYLENOL IF NEEDED   __X__ Stop supplements until after surgery.    ____ Bring C-Pap to the hospital.    If you have any questions regarding your pre-procedure instructions,  Please call Pre-admit Testing at [phone]

## 2022-12-25 LAB — CULTURE, URINE COMPREHENSIVE

## 2023-01-01 ENCOUNTER — Ambulatory Visit
Admission: RE | Admit: 2023-01-01 | Discharge: 2023-01-01 | Disposition: A | Discharge status: Home / Self Care | Payer: Medicare Other | Attending: Urology | Admitting: Urology

## 2023-01-01 ENCOUNTER — Encounter
Admission: RE | Disposition: A | Discharge status: Home / Self Care | Payer: Self-pay | Source: Home / Self Care | Attending: Urology

## 2023-01-01 ENCOUNTER — Encounter: Payer: Self-pay | Admitting: Urology

## 2023-01-01 ENCOUNTER — Ambulatory Visit: Payer: Medicare Other | Admitting: Certified Registered"

## 2023-01-01 ENCOUNTER — Ambulatory Visit: Payer: Medicare Other

## 2023-01-01 ENCOUNTER — Ambulatory Visit: Payer: Medicare Other | Admitting: Urgent Care

## 2023-01-01 DIAGNOSIS — N1330 Unspecified hydronephrosis: Secondary | ICD-10-CM | POA: Diagnosis present

## 2023-01-01 DIAGNOSIS — K219 Gastro-esophageal reflux disease without esophagitis: Secondary | ICD-10-CM | POA: Insufficient documentation

## 2023-01-01 DIAGNOSIS — F32A Depression, unspecified: Secondary | ICD-10-CM | POA: Diagnosis not present

## 2023-01-01 DIAGNOSIS — Z794 Long term (current) use of insulin: Secondary | ICD-10-CM | POA: Insufficient documentation

## 2023-01-01 DIAGNOSIS — I10 Essential (primary) hypertension: Secondary | ICD-10-CM | POA: Diagnosis not present

## 2023-01-01 DIAGNOSIS — Z79899 Other long term (current) drug therapy: Secondary | ICD-10-CM | POA: Diagnosis not present

## 2023-01-01 DIAGNOSIS — N185 Chronic kidney disease, stage 5: Secondary | ICD-10-CM | POA: Insufficient documentation

## 2023-01-01 DIAGNOSIS — F419 Anxiety disorder, unspecified: Secondary | ICD-10-CM | POA: Diagnosis not present

## 2023-01-01 DIAGNOSIS — I120 Hypertensive chronic kidney disease with stage 5 chronic kidney disease or end stage renal disease: Secondary | ICD-10-CM | POA: Insufficient documentation

## 2023-01-01 DIAGNOSIS — E119 Type 2 diabetes mellitus without complications: Secondary | ICD-10-CM | POA: Insufficient documentation

## 2023-01-01 DIAGNOSIS — E1165 Type 2 diabetes mellitus with hyperglycemia: Secondary | ICD-10-CM

## 2023-01-01 DIAGNOSIS — Z87891 Personal history of nicotine dependence: Secondary | ICD-10-CM | POA: Diagnosis not present

## 2023-01-01 DIAGNOSIS — E785 Hyperlipidemia, unspecified: Secondary | ICD-10-CM | POA: Insufficient documentation

## 2023-01-01 HISTORY — PX: CYSTOSCOPY W/ URETERAL STENT PLACEMENT: SHX1429

## 2023-01-01 LAB — GLUCOSE, CAPILLARY
Glucose-Capillary: 129 mg/dL — ABNORMAL HIGH (ref 70–99)
Glucose-Capillary: 139 mg/dL — ABNORMAL HIGH (ref 70–99)

## 2023-01-01 SURGERY — CYSTOSCOPY, FLEXIBLE, WITH STENT REPLACEMENT
Anesthesia: General | Site: Ureter | Laterality: Right

## 2023-01-01 MED ORDER — MIDAZOLAM HCL 2 MG/2ML IJ SOLN
INTRAMUSCULAR | Status: DC | PRN
  Administered 2023-01-01: .5 mg via INTRAVENOUS
  Administered 2023-01-01: 1 mg via INTRAVENOUS
  Administered 2023-01-01: .5 mg via INTRAVENOUS

## 2023-01-01 MED ORDER — ONDANSETRON HCL 4 MG/2ML IJ SOLN: 4.0000 mg | Freq: Once | INTRAMUSCULAR | Status: DC | PRN

## 2023-01-01 MED ORDER — CHLORHEXIDINE GLUCONATE 0.12 % MT SOLN
OROMUCOSAL | Status: AC
  Administered 2023-01-01: 15 mL via OROMUCOSAL
  Filled 2023-01-01: qty 15

## 2023-01-01 MED ORDER — MIDAZOLAM HCL 2 MG/2ML IJ SOLN
INTRAMUSCULAR | Status: AC
  Filled 2023-01-01: qty 2

## 2023-01-01 MED ORDER — CEFAZOLIN SODIUM-DEXTROSE 2-4 GM/100ML-% IV SOLN
INTRAVENOUS | Status: AC
  Filled 2023-01-01: qty 100

## 2023-01-01 MED ORDER — SODIUM CHLORIDE 0.9 % IV SOLN: INTRAVENOUS | Status: DC

## 2023-01-01 MED ORDER — ORAL CARE MOUTH RINSE: 15.0000 mL | Freq: Once | OROMUCOSAL | Status: AC

## 2023-01-01 MED ORDER — FENTANYL CITRATE (PF) 100 MCG/2ML IJ SOLN
INTRAMUSCULAR | Status: AC
  Filled 2023-01-01: qty 2

## 2023-01-01 MED ORDER — ONDANSETRON HCL 4 MG/2ML IJ SOLN
INTRAMUSCULAR | Status: DC | PRN
  Administered 2023-01-01: 4 mg via INTRAVENOUS

## 2023-01-01 MED ORDER — LIDOCAINE HCL (CARDIAC) PF 100 MG/5ML IV SOSY
PREFILLED_SYRINGE | INTRAVENOUS | Status: DC | PRN
  Administered 2023-01-01: 50 mg via INTRAVENOUS

## 2023-01-01 MED ORDER — CHLORHEXIDINE GLUCONATE 0.12 % MT SOLN: 15.0000 mL | Freq: Once | OROMUCOSAL | Status: AC

## 2023-01-01 MED ORDER — PROPOFOL 10 MG/ML IV BOLUS
INTRAVENOUS | Status: DC | PRN
  Administered 2023-01-01: 130 mg via INTRAVENOUS

## 2023-01-01 MED ORDER — CEFAZOLIN SODIUM-DEXTROSE 2-4 GM/100ML-% IV SOLN
2.0000 g | INTRAVENOUS | Status: AC
  Administered 2023-01-01: 2 g via INTRAVENOUS

## 2023-01-01 MED ORDER — SEVOFLURANE IN SOLN
RESPIRATORY_TRACT | Status: AC
  Filled 2023-01-01: qty 250

## 2023-01-01 MED ORDER — FENTANYL CITRATE (PF) 100 MCG/2ML IJ SOLN
INTRAMUSCULAR | Status: DC | PRN
  Administered 2023-01-01 (×2): 25 ug via INTRAVENOUS
  Administered 2023-01-01: 50 ug via INTRAVENOUS

## 2023-01-01 MED ORDER — FENTANYL CITRATE (PF) 100 MCG/2ML IJ SOLN: 25.0000 ug | INTRAMUSCULAR | Status: DC | PRN

## 2023-01-01 MED ORDER — SODIUM CHLORIDE 0.9 % IR SOLN
Status: DC | PRN
  Administered 2023-01-01: 3000 mL via INTRAVESICAL

## 2023-01-01 SURGICAL SUPPLY — 17 items
BAG DRAIN SIEMENS DORNER NS (MISCELLANEOUS) ×1 IMPLANT
BAG DRN NS LF (MISCELLANEOUS) ×1
CATH URETL OPEN 5X70 (CATHETERS) IMPLANT
GAUZE 4X4 16PLY ~~LOC~~+RFID DBL (SPONGE) ×2 IMPLANT
GLOVE SURG UNDER POLY LF SZ7.5 (GLOVE) ×1 IMPLANT
GOWN STRL REUS W/ TWL XL LVL3 (GOWN DISPOSABLE) ×1 IMPLANT
GOWN STRL REUS W/TWL XL LVL3 (GOWN DISPOSABLE) ×1
GUIDEWIRE STR DUAL SENSOR (WIRE) ×1 IMPLANT
IV NS IRRIG 3000ML ARTHROMATIC (IV SOLUTION) ×1 IMPLANT
KIT TURNOVER CYSTO (KITS) ×1 IMPLANT
MANIFOLD NEPTUNE II (INSTRUMENTS) ×1 IMPLANT
PACK CYSTO AR (MISCELLANEOUS) ×1 IMPLANT
SET CYSTO W/LG BORE CLAMP LF (SET/KITS/TRAYS/PACK) ×1 IMPLANT
STENT URET 6FRX24 CONTOUR (STENTS) IMPLANT
STENT URET 6FRX26 CONTOUR (STENTS) IMPLANT
SURGILUBE 2OZ TUBE FLIPTOP (MISCELLANEOUS) ×1 IMPLANT
WATER STERILE IRR 500ML POUR (IV SOLUTION) ×1 IMPLANT

## 2023-01-01 NOTE — Transfer of Care (Signed)
Immediate Anesthesia Transfer of Care Note  Patient: Heather Lopez  Procedure(s) Performed: CYSTOSCOPY WITH STENT EXCHANGE (Right: Ureter)  Patient Location: PACU  Anesthesia Type:General  Level of Consciousness: drowsy  Airway & Oxygen Therapy: Patient Spontanous Breathing and Patient connected to face mask oxygen  Post-op Assessment: Report given to RN, Post -op Vital signs reviewed and stable, and Patient moving all extremities  Post vital signs: Reviewed and stable  Last Vitals:  Vitals Value Taken Time  BP 110/51 01/01/23 0939  Temp    Pulse 59 01/01/23 0942  Resp 3 01/01/23 0942  SpO2 97 % 01/01/23 0942  Vitals shown include unvalidated device data.  Last Pain:  Vitals:   01/01/23 0814  TempSrc: Temporal  PainSc: 0-No pain         Complications: No notable events documented.

## 2023-01-01 NOTE — Interval H&P Note (Signed)
History and Physical Interval Note:  01/01/2023 8:58 AM  Heather Lopez  has presented today for surgery, with the diagnosis of Right Hydronephrosis.  The various methods of treatment have been discussed with the patient and family. After consideration of risks, benefits and other options for treatment, the patient has consented to  Procedure(s): CYSTOSCOPY WITH STENT EXCHANGE (Right) as a surgical intervention.  The patient's history has been reviewed, patient examined, no change in status, stable for surgery.  I have reviewed the patient's chart and labs.  Questions were answered to the patient's satisfaction.     [place]

## 2023-01-01 NOTE — Anesthesia Preprocedure Evaluation (Signed)
Anesthesia Evaluation  Patient identified by MRN, date of birth, ID band Patient awake    Reviewed: Allergy & Precautions, H&P , NPO status , Patient's Chart, lab work & pertinent test results, reviewed documented beta blocker date and time   Airway Mallampati: II  TM Distance: >3 FB Neck ROM: full    Dental  (+) Teeth Intact   Pulmonary neg pulmonary ROS, former smoker   Pulmonary exam normal        Cardiovascular Exercise Tolerance: Good hypertension, On Medications Normal cardiovascular exam+ dysrhythmias  Rate:Normal     Neuro/Psych  PSYCHIATRIC DISORDERS Anxiety Depression    negative neurological ROS     GI/Hepatic Neg liver ROS, PUD,GERD  ,,  Endo/Other  negative endocrine ROSdiabetes    Renal/GU Renal disease  negative genitourinary   Musculoskeletal   Abdominal   Peds  Hematology  (+) Blood dyscrasia, anemia   Anesthesia Other Findings   Reproductive/Obstetrics negative OB ROS                             Anesthesia Physical Anesthesia Plan  ASA: 3  Anesthesia Plan: General LMA   Post-op Pain Management:    Induction:   PONV Risk Score and Plan: 4 or greater  Airway Management Planned:   Additional Equipment:   Intra-op Plan:   Post-operative Plan:   Informed Consent: I have reviewed the patients History and Physical, chart, labs and discussed the procedure including the risks, benefits and alternatives for the proposed anesthesia with the patient or authorized representative who has indicated his/her understanding and acceptance.       Plan Discussed with: CRNA  Anesthesia Plan Comments:        Anesthesia Quick Evaluation

## 2023-01-01 NOTE — Anesthesia Procedure Notes (Signed)
Procedure Name: LMA Insertion Date/Time: 01/01/2023 9:13 AM  Performed by: Lorie Apley, CRNAPre-anesthesia Checklist: Patient identified, Patient being monitored, Timeout performed, Emergency Drugs available and Suction available Patient Re-evaluated:Patient Re-evaluated prior to induction Oxygen Delivery Method: Circle system utilized Preoxygenation: Pre-oxygenation with 100% oxygen Induction Type: IV induction Ventilation: Mask ventilation without difficulty LMA: LMA inserted LMA Size: 4.0 Tube type: Oral Number of attempts: 1 Placement Confirmation: positive ETCO2 and breath sounds checked- equal and bilateral Tube secured with: Tape Dental Injury: Teeth and Oropharynx as per pre-operative assessment

## 2023-01-01 NOTE — Op Note (Signed)
   Preoperative diagnosis:  Right hydronephrosis  Postoperative diagnosis:  Right hydronephrosis  Procedure: Cystoscopy with right ureteral stent exchange  Surgeon: Abbie Sons, MD  Anesthesia: General  Complications: None  Intraoperative findings:  Cystoscopy-bladder mucosa without solid or papillary lesions. Patulous UOs bilaterally    EBL: Minimal  Specimens: None  Indication: Heather Lopez is a 77 y.o. female with right hydronephrosis with obstruction of undetermined etiology currently managed with periodic stent changes.  After reviewing the management options for treatment, he elected to proceed with the above surgical procedure(s). We have discussed the potential benefits and risks of the procedure, side effects of the proposed treatment, the likelihood of the patient achieving the goals of the procedure, and any potential problems that might occur during the procedure or recuperation. Informed consent has been obtained.  Description of procedure:  The patient was taken to the operating room and general anesthesia was induced.  The patient was placed in the dorsal lithotomy position, prepped and draped in the usual sterile fashion, and preoperative antibiotics were administered. A preoperative time-out was performed.   A 21 French cystoscope was lubricated and passed per urethra.  Panendoscopy was performed with findings as described above.    The indwelling stent was grasped with endoscopic forceps and brought out through the urethral meatus.  A 0.038 Sensor wire was then placed through the stent and advanced into the renal pelvis under fluoroscopic guidance.  The stent was removed and a 93F/26 cm Bard Optima inlay stent was advanced over the wire under fluoroscopic guidance.  The guidewire was removed and there was a good curl in the renal pelvis as well as the bladder.  The bladder was emptied with a cystoscope sheath.  She tolerated procedure well and was transported the  PACU in stable condition  Plan: Follow-up office visit with KUB 3-4 months   Abbie Sons, M.D.

## 2023-01-01 NOTE — H&P (Signed)
Urology H&P   History of Present Illness: Heather Lopez is a 77 y.o. with a history of right hydronephrosis which on previous evaluation felt to be nonobstructive however when her stent was removed she had a rising creatinine that resolved with stent replacement.  She has elected an indwelling right ureteral stent and presents today for stent exchange.  Last exchange was 07/03/2022.  She has no complaints.  Preoperative urine culture grew mixed flora.  Past Medical History:  Diagnosis Date   Acute renal failure ([place]) 06/27/2021   Anxiety    Aortic atherosclerosis ([facility])    Cataract    Depression    Dysrhythmia    GERD (gastroesophageal reflux disease)    Gout    History of kidney stones    Hyperlipidemia    Hypertension    Nausea & vomiting 12/02/2021   Obesity    Obstruction of left ureteropelvic junction (UPJ) due to stone 06/2021   Osteoarthritis    Perforated duodenal ulcer ([place]) 06/27/2021   Pyonephrosis 06/2021   left   Septic shock ([place]) 06/27/2021   Type 2 diabetes mellitus with stage 5 chronic kidney disease ([place])    Vertigo     Past Surgical History:  Procedure Laterality Date   CATARACT EXTRACTION, BILATERAL Bilateral 2019   COLONOSCOPY WITH PROPOFOL N/A 08/19/2015   Procedure: COLONOSCOPY WITH PROPOFOL;  Surgeon: Lucilla Lame, MD;  Location: [place];  Service: Endoscopy;  Laterality: N/A;  diabetic - insulin   CYSTOSCOPY W/ RETROGRADES Bilateral 12/22/2019   Procedure: CYSTOSCOPY WITH RETROGRADE PYELOGRAM;  Surgeon: Abbie Sons, MD;  Location: [facility];  Service: Urology;  Laterality: Bilateral;   CYSTOSCOPY W/ URETERAL STENT PLACEMENT Bilateral 07/05/2021   Procedure: CYSTOSCOPY WITH RETROGRADE PYELOGRAM/BILATERAL URETERAL STENT PLACEMENT;  Surgeon: Abbie Sons, MD;  Location: [facility];  Service: Urology;  Laterality: Bilateral;   CYSTOSCOPY W/ URETERAL STENT PLACEMENT Bilateral 12/26/2021   Procedure: CYSTOSCOPY WITH STENT REMOVAL;  Surgeon:  Abbie Sons, MD;  Location: [facility];  Service: Urology;  Laterality: Bilateral;   CYSTOSCOPY W/ URETERAL STENT PLACEMENT Right 03/13/2022   Procedure: CYSTOSCOPY WITH RETROGRADE PYELOGRAM/URETERAL STENT PLACEMENT;  Surgeon: Abbie Sons, MD;  Location: [facility];  Service: Urology;  Laterality: Right;   CYSTOSCOPY W/ URETERAL STENT REMOVAL Right 05/08/2022   Procedure: CYSTOSCOPY WITH STENT REMOVAL;  Surgeon: Abbie Sons, MD;  Location: [facility];  Service: Urology;  Laterality: Right;   CYSTOSCOPY WITH BIOPSY N/A 12/22/2019   Procedure: CYSTOSCOPY WITH bladder BIOPSY;  Surgeon: Abbie Sons, MD;  Location: [facility];  Service: Urology;  Laterality: N/A;   CYSTOSCOPY WITH BIOPSY  03/13/2022   Procedure: CYSTOSCOPY WITH BLADDER BIOPSY;  Surgeon: Abbie Sons, MD;  Location: [facility];  Service: Urology;;   CYSTOSCOPY WITH STENT PLACEMENT Right 12/22/2019   Procedure: CYSTOSCOPY WITH STENT PLACEMENT;  Surgeon: Abbie Sons, MD;  Location: [facility];  Service: Urology;  Laterality: Right;   CYSTOSCOPY WITH STENT PLACEMENT Right 07/03/2022   Procedure: CYSTOSCOPY/RIGHT URETEROSCOPY WITH STENT PLACEMENT;  Surgeon: Abbie Sons, MD;  Location: [facility];  Service: Urology;  Laterality: Right;   CYSTOSCOPY WITH URETEROSCOPY Bilateral 12/22/2019   Procedure: CYSTOSCOPY WITH URETEROSCOPY;  Surgeon: Abbie Sons, MD;  Location: [facility];  Service: Urology;  Laterality: Bilateral;   CYSTOSCOPY/URETEROSCOPY/HOLMIUM LASER/STENT PLACEMENT Right 12/22/2019   Procedure: CYSTOSCOPY/URETEROSCOPY/HOLMIUM LASER/STENT PLACEMENT;  Surgeon: Abbie Sons, MD;  Location: [facility];  Service: Urology;  Laterality: Right;   CYSTOSCOPY/URETEROSCOPY/HOLMIUM LASER/STENT  PLACEMENT Bilateral 12/08/2021   Procedure: CYSTOSCOPY/URETEROSCOPY/HOLMIUM LASER/STENT PLACEMENT;  Surgeon: [name], MD;  Location: [facility];  Service: Urology;  Laterality: Bilateral;   DIALYSIS/PERMA CATHETER INSERTION N/A  06/29/2021   Procedure: DIALYSIS/PERMA CATHETER INSERTION;  Surgeon: Algernon Huxley, MD;  Location: [place] [facility];  Service: Cardiovascular;  Laterality: N/A;   DIALYSIS/PERMA CATHETER INSERTION N/A 07/13/2021   Procedure: DIALYSIS/PERMA CATHETER INSERTION;  Surgeon: Algernon Huxley, MD;  Location: [place] [facility];  Service: Cardiovascular;  Laterality: N/A;   EYE SURGERY     IR GASTROSTOMY TUBE MOD SED  08/01/2021   LAPAROTOMY N/A 06/29/2021   Procedure: EXPLORATORY LAPAROTOMY WITH REPAIR OF DUODENAL PERFORATION;  Surgeon: [name], MD;  Location: [facility];  Service: General;  Laterality: N/A;   POLYPECTOMY  08/19/2015   Procedure: POLYPECTOMY;  Surgeon: Lucilla Lame, MD;  Location: [place];  Service: Endoscopy;;   TUBAL LIGATION  1992    Home Medications:  Current Meds  Medication Sig   atorvastatin (LIPITOR) 20 MG tablet Take 20 mg by mouth daily.   buPROPion (WELLBUTRIN XL) 150 MG 24 hr tablet Take 150 mg by mouth daily.   busPIRone (BUSPAR) 5 MG tablet Take 5 mg by mouth daily.   cloNIDine (CATAPRES) 0.1 MG tablet Take 1 tablet (0.1 mg total) by mouth 2 (two) times daily.   conjugated estrogens (PREMARIN) vaginal cream Apply one pea-sized amount around the opening of the urethra daily for 2 weeks, then 3 times weekly moving forward.   ferrous sulfate 325 (65 FE) MG tablet Take 325 mg by mouth daily with breakfast.   insulin aspart (NOVOLOG) 100 UNIT/ML injection Inject 10 Units into the skin 3 (three) times daily before meals.   insulin glargine (LANTUS) 100 UNIT/ML injection Inject 70 Units into the skin daily.   lansoprazole (PREVACID) 30 MG capsule Take 30 mg by mouth at bedtime.   metoprolol tartrate (LOPRESSOR) 100 MG tablet Take 1 tablet (100 mg total) by mouth 2 (two) times daily.   Multiple Vitamin (MULTIVITAMIN WITH MINERALS) TABS tablet Take 1 tablet by mouth daily.   polyethylene glycol (MIRALAX / GLYCOLAX) 17 g packet Take 17 g by mouth daily as needed for  mild constipation or moderate constipation.   senna-docusate (SENOKOT-S) 8.6-50 MG tablet Take 1 tablet by mouth at bedtime as needed for mild constipation or moderate constipation.   Vibegron (GEMTESA) 75 MG TABS Take 75 mg by mouth daily.    Allergies:  Allergies  Allergen Reactions   Contrast Media  [Iodinated Contrast Media] Anaphylaxis   Iodine Swelling    (IV only) - angioedema    Family History  Problem Relation Age of Onset   Diabetes Father    Cancer Father        lung cancer   Diabetes Brother    Stroke Mother     Social History:  reports that she quit smoking about 51 years ago. Her smoking use included cigarettes. She has a 4.50 pack-year smoking history. She has never used smokeless tobacco. She reports that she does not drink alcohol and does not use drugs.  ROS: As per the HPI  Physical Exam:  Vital signs in last 24 hours: Temp:  [97.3 F (36.3 C)] 97.3 F (36.3 C) (02/06 0814) Pulse Rate:  [66] 66 (02/06 0814) Resp:  [18] 18 (02/06 0814) BP: (162)/(86) 162/86 (02/06 0814) SpO2:  [99 %] 99 % (02/06 0814) Weight:  [95.3 kg] 95.3 kg (02/06 0814) Constitutional:  Alert,  No acute distress HEENT: [place] AT Cardiovascular: Regular rate and rhythm Respiratory: Normal respiratory effort, lungs clear bilaterally Psychiatric: Normal mood and affect   Laboratory Data:  No results for input(s): "WBC", "HGB", "HCT" in the last 72 hours. No results for input(s): "NA", "K", "CL", "CO2", "GLUCOSE", "BUN", "CREATININE", "CALCIUM" in the last 72 hours. No results for input(s): "LABPT", "INR" in the last 72 hours. No results for input(s): "LABURIN" in the last 72 hours. Results for orders placed or performed in visit on 12/20/22  CULTURE, URINE COMPREHENSIVE     Status: None   Collection Time: 12/20/22  9:54 AM   Specimen: Urine   UR  Result Value Ref Range Status   Urine Culture, Comprehensive Final report  Final   Organism ID, Bacteria Comment  Final    Comment:  Mixed urogenital flora 10,000-25,000 colony forming units per mL   Microscopic Examination     Status: Abnormal   Collection Time: 12/20/22  9:54 AM   Urine  Result Value Ref Range Status   WBC, UA >30 (A) 0 - 5 /hpf Final   RBC, Urine 11-30 (A) 0 - 2 /hpf Final   Epithelial Cells (non renal) 0-10 0 - 10 /hpf Final   Bacteria, UA Many (A) None seen/Few Final     Impression/Assessment:   Right hydronephrosis Undetermined etiology  Plan:  Cystoscopy with right ureteral stent exchange   01/01/2023, 8:51 AM  John Giovanni,  MD

## 2023-01-01 NOTE — Discharge Instructions (Addendum)
AMBULATORY SURGERY  DISCHARGE INSTRUCTIONS   The drugs that you were given will stay in your system until tomorrow so for the next 24 hours you should not:  Drive an automobile Make any legal decisions Drink any alcoholic beverage   You may resume regular meals tomorrow.  Today it is better to start with liquids and gradually work up to solid foods.  You may eat anything you prefer, but it is better to start with liquids, then soup and crackers, and gradually work up to solid foods.   Please notify your doctor immediately if you have any unusual bleeding, trouble breathing, redness and pain at the surgery site, drainage, fever, or pain not relieved by medication.    Additional Instructions:  Please contact your physician with any problems or Same Day Surgery at [phone], Monday through Friday 6 am to 4 pm, or [place] at [facility] number at [phone].  DISCHARGE INSTRUCTIONS FOR URETERAL STENT   MEDICATIONS:  1. Resume all your other meds from home.  2.  AZO (over-the-counter) can help with the burning/stinging when you urinate.   ACTIVITY:  1. May resume regular activities in 24 hours.   SIGNS/SYMPTOMS TO CALL:  Common postoperative symptoms include urinary frequency, urgency, bladder spasm and blood in the urine  Please call us if you have a fever greater than 101.5, uncontrolled nausea/vomiting, uncontrolled pain, dizziness, unable to urinate, excessively bloody urine, chest pain, shortness of breath, leg swelling, leg pain, or any other concerns or questions.   You can reach [place] at [phone].   FOLLOW-UP:  1. You will be contacted for a follow-up appointment

## 2023-01-02 ENCOUNTER — Encounter: Payer: Self-pay | Admitting: Urology

## 2023-01-04 ENCOUNTER — Other Ambulatory Visit: Payer: Self-pay | Admitting: *Deleted

## 2023-01-04 MED ORDER — GEMTESA 75 MG PO TABS: 75.0000 mg | ORAL_TABLET | Freq: Every day | ORAL | 3 refills | Status: DC

## 2023-01-07 NOTE — Anesthesia Postprocedure Evaluation (Signed)
Anesthesia Post Note  Patient: Heather Lopez  Procedure(s) Performed: CYSTOSCOPY WITH STENT EXCHANGE (Right: Ureter)  Patient location during evaluation: PACU Anesthesia Type: General Level of consciousness: awake and alert Pain management: pain level controlled Vital Signs Assessment: post-procedure vital signs reviewed and stable Respiratory status: spontaneous breathing, nonlabored ventilation, respiratory function stable and patient connected to nasal cannula oxygen Cardiovascular status: blood pressure returned to baseline and stable Postop Assessment: no apparent nausea or vomiting Anesthetic complications: no   No notable events documented.   Last Vitals:  Vitals:   01/01/23 1015 01/01/23 1028  BP: (!) 126/59 (!) 142/58  Pulse: 64 66  Resp: 14 17  Temp: 36.6 C 36.5 C  SpO2: 95% 94%    Last Pain:  Vitals:   01/02/23 0919  TempSrc:   PainSc: [name]

## 2023-01-08 ENCOUNTER — Telehealth: Payer: Self-pay | Admitting: Family Medicine

## 2023-01-08 DIAGNOSIS — R350 Frequency of micturition: Secondary | ICD-10-CM

## 2023-01-08 NOTE — Telephone Encounter (Signed)
Patient informed her insurance company will not approve Gemtesa. They a requiring her to have pelvic floor therapy. Other medication she may try are Detrol LA, Oxybutynin, Trospium, Vesicare, Enablex, Toviaz

## 2023-01-10 MED ORDER — TROSPIUM CHLORIDE 20 MG PO TABS: 20.0000 mg | ORAL_TABLET | Freq: Every day | ORAL | 2 refills | Status: DC

## 2023-01-10 NOTE — Telephone Encounter (Signed)
Options would include Gemtesa samples with the understanding that she may have interruptions in her therapy if we are temporarily unable to provide them or a trial of trospium. I do not recommend any of the other listed anticholinergics due to her age and risk of cognitive declined/dementia with long term use of those drugs. If she wants to try trospium, please make sure she does not have any issues with constipation, dry mouth, or dry eye at baseline, as these would be contraindications to starting this med.

## 2023-01-10 NOTE — Telephone Encounter (Signed)
Patient states she would like to try the Trospium. Medication sent to [facility]

## 2023-01-10 NOTE — Telephone Encounter (Signed)
Med sent. Please schedule her for sx recheck and PVR in 4 weeks.

## 2023-01-11 ENCOUNTER — Encounter: Payer: Self-pay | Admitting: *Deleted

## 2023-02-08 ENCOUNTER — Ambulatory Visit (INDEPENDENT_AMBULATORY_CARE_PROVIDER_SITE_OTHER): Payer: Medicare Other | Admitting: Physician Assistant

## 2023-02-08 DIAGNOSIS — R350 Frequency of micturition: Secondary | ICD-10-CM | POA: Diagnosis not present

## 2023-02-08 LAB — BLADDER SCAN AMB NON-IMAGING: Scan Result: 89

## 2023-02-08 MED ORDER — TROSPIUM CHLORIDE 20 MG PO TABS: 20.0000 mg | ORAL_TABLET | Freq: Every day | ORAL | 3 refills | Status: DC

## 2023-02-08 NOTE — Progress Notes (Signed)
02/08/2023 9:44 AM   Heather Lopez October 12, 1946 [id number]  CC: Chief Complaint  Patient presents with   Follow-up    Symptom recheck    HPI: Heather Lopez is a 77 y.o. female with PMH uncontrolled diabetes, CKD, nephrolithiasis, chronic right hydroureteronephrosis managed with right ureteral stent, and urinary urgency/frequency who presents today for symptom recheck on trospium 20 mg daily.   Today she reports trospium manages her urinary symptoms as well as [place] (which insurance would not cover).  She continues to be bothered by nocturia every 60 to 75 minutes, but reports less daytime frequency.  She has been taking trospium around noon.  She reports some intermittent dry mouth since starting trospium, but denies constipation or dry eye.  No known history of OSA.  No sleep study history.  She does not think she snores.  PVR 89 mL.  PMH: Past Medical History:  Diagnosis Date   Acute renal failure ([place]) 06/27/2021   Anxiety    Aortic atherosclerosis (H[facility])    Cataract    Depression    Dysrhythmia    GERD (gastroesophageal reflux disease)    Gout    History of kidney stones    Hyperlipidemia    Hypertension    Nausea & vomiting 12/02/2021   Obesity    Obstruction of left ureteropelvic junction (UPJ) due to stone 06/2021   Osteoarthritis    Perforated duodenal ulcer ([place]) 06/27/2021   Pyonephrosis 06/2021   left   Septic shock ([place]) 06/27/2021   Type 2 diabetes mellitus with stage 5 chronic kidney disease ([place])    Vertigo     Surgical History: Past Surgical History:  Procedure Laterality Date   CATARACT EXTRACTION, BILATERAL Bilateral 2019   COLONOSCOPY WITH PROPOFOL N/A 08/19/2015   Procedure: COLONOSCOPY WITH PROPOFOL;  Surgeon: Lucilla Lame, MD;  Location: [facility];  Service: Endoscopy;  Laterality: N/A;  diabetic - insulin   CYSTOSCOPY W/ RETROGRADES Bilateral 12/22/2019   Procedure: CYSTOSCOPY WITH RETROGRADE PYELOGRAM;  Surgeon: Abbie Sons, MD;   Location: [facility];  Service: Urology;  Laterality: Bilateral;   CYSTOSCOPY W/ URETERAL STENT PLACEMENT Bilateral 07/05/2021   Procedure: CYSTOSCOPY WITH RETROGRADE PYELOGRAM/BILATERAL URETERAL STENT PLACEMENT;  Surgeon: Abbie Sons, MD;  Location: [facility];  Service: Urology;  Laterality: Bilateral;   CYSTOSCOPY W/ URETERAL STENT PLACEMENT Bilateral 12/26/2021   Procedure: CYSTOSCOPY WITH STENT REMOVAL;  Surgeon: Abbie Sons, MD;  Location: [facility];  Service: Urology;  Laterality: Bilateral;   CYSTOSCOPY W/ URETERAL STENT PLACEMENT Right 03/13/2022   Procedure: CYSTOSCOPY WITH RETROGRADE PYELOGRAM/URETERAL STENT PLACEMENT;  Surgeon: Abbie Sons, MD;  Location: [facility];  Service: Urology;  Laterality: Right;   CYSTOSCOPY W/ URETERAL STENT PLACEMENT Right 01/01/2023   Procedure: CYSTOSCOPY WITH STENT EXCHANGE;  Surgeon: Abbie Sons, MD;  Location: [facility];  Service: Urology;  Laterality: Right;   CYSTOSCOPY W/ URETERAL STENT REMOVAL Right 05/08/2022   Procedure: CYSTOSCOPY WITH STENT REMOVAL;  Surgeon: Abbie Sons, MD;  Location: [facility];  Service: Urology;  Laterality: Right;   CYSTOSCOPY WITH BIOPSY N/A 12/22/2019   Procedure: CYSTOSCOPY WITH bladder BIOPSY;  Surgeon: Abbie Sons, MD;  Location: [facility];  Service: Urology;  Laterality: N/A;   CYSTOSCOPY WITH BIOPSY  03/13/2022   Procedure: CYSTOSCOPY WITH BLADDER BIOPSY;  Surgeon: Abbie Sons, MD;  Location: [facility];  Service: Urology;;   CYSTOSCOPY WITH STENT PLACEMENT Right 12/22/2019   Procedure: CYSTOSCOPY WITH STENT PLACEMENT;  Surgeon: Bernardo Heater,  Ronda Fairly, MD;  Location: [facility];  Service: Urology;  Laterality: Right;   CYSTOSCOPY WITH STENT PLACEMENT Right 07/03/2022   Procedure: CYSTOSCOPY/RIGHT URETEROSCOPY WITH STENT PLACEMENT;  Surgeon: Abbie Sons, MD;  Location: [facility];  Service: Urology;  Laterality: Right;   CYSTOSCOPY WITH URETEROSCOPY Bilateral 12/22/2019   Procedure: CYSTOSCOPY WITH  URETEROSCOPY;  Surgeon: Abbie Sons, MD;  Location: [facility];  Service: Urology;  Laterality: Bilateral;   CYSTOSCOPY/URETEROSCOPY/HOLMIUM LASER/STENT PLACEMENT Right 12/22/2019   Procedure: CYSTOSCOPY/URETEROSCOPY/HOLMIUM LASER/STENT PLACEMENT;  Surgeon: Abbie Sons, MD;  Location: [facility];  Service: Urology;  Laterality: Right;   CYSTOSCOPY/URETEROSCOPY/HOLMIUM LASER/STENT PLACEMENT Bilateral 12/08/2021   Procedure: CYSTOSCOPY/URETEROSCOPY/HOLMIUM LASER/STENT PLACEMENT;  Surgeon: [name], MD;  Location: [facility];  Service: Urology;  Laterality: Bilateral;   DIALYSIS/PERMA CATHETER INSERTION N/A 06/29/2021   Procedure: DIALYSIS/PERMA CATHETER INSERTION;  Surgeon: Algernon Huxley, MD;  Location: [facility];  Service: Cardiovascular;  Laterality: N/A;   DIALYSIS/PERMA CATHETER INSERTION N/A 07/13/2021   Procedure: DIALYSIS/PERMA CATHETER INSERTION;  Surgeon: Algernon Huxley, MD;  Location: [facility];  Service: Cardiovascular;  Laterality: N/A;   EYE SURGERY     IR GASTROSTOMY TUBE MOD SED  08/01/2021   LAPAROTOMY N/A 06/29/2021   Procedure: EXPLORATORY LAPAROTOMY WITH REPAIR OF DUODENAL PERFORATION;  Surgeon: [name], MD;  Location: [facility];  Service: General;  Laterality: N/A;   POLYPECTOMY  08/19/2015   Procedure: POLYPECTOMY;  Surgeon: Lucilla Lame, MD;  Location: [facility];  Service: Endoscopy;;   TUBAL LIGATION  1992    Home Medications:  Allergies as of 02/08/2023       Reactions   Contrast Media  [iodinated Contrast Media] Anaphylaxis   Iodine Swelling   (IV only) - angioedema        Medication List        Accurate as of February 08, 2023  9:44 AM. If you have any questions, ask your nurse or doctor.          aspirin EC 81 MG tablet Take 81 mg by mouth daily.   atorvastatin 20 MG tablet Commonly known as: LIPITOR Take 20 mg by mouth daily.   buPROPion 150 MG 24 hr tablet Commonly known as: WELLBUTRIN XL Take 150 mg by mouth  daily.   busPIRone 5 MG tablet Commonly known as: BUSPAR Take 5 mg by mouth daily.   cloNIDine 0.1 MG tablet Commonly known as: CATAPRES Take 1 tablet (0.1 mg total) by mouth 2 (two) times daily.   ferrous sulfate 325 (65 FE) MG tablet Take 325 mg by mouth daily with breakfast.   insulin aspart 100 UNIT/ML injection Commonly known as: novoLOG Inject 10 Units into the skin 3 (three) times daily before meals.   insulin glargine 100 UNIT/ML injection Commonly known as: LANTUS Inject 70 Units into the skin daily.   lansoprazole 30 MG capsule Commonly known as: PREVACID Take 30 mg by mouth at bedtime.   loperamide 2 MG tablet Commonly known as: IMODIUM A-D Take 2 mg by mouth every 6 (six) hours as needed for diarrhea or loose stools.   metoprolol tartrate 100 MG tablet Commonly known as: LOPRESSOR Take 1 tablet (100 mg total) by mouth 2 (two) times daily.   multivitamin with minerals Tabs tablet Take 1 tablet by mouth daily.   polyethylene glycol 17 g packet Commonly known as: MIRALAX / GLYCOLAX Take 17 g by mouth daily as needed for mild constipation or moderate constipation.  Premarin vaginal cream Generic drug: conjugated estrogens Apply one pea-sized amount around the opening of the urethra daily for 2 weeks, then 3 times weekly moving forward.   senna-docusate 8.6-50 MG tablet Commonly known as: Senokot-S Take 1 tablet by mouth at bedtime as needed for mild constipation or moderate constipation.   trospium 20 MG tablet Commonly known as: SANCTURA Take 1 tablet (20 mg total) by mouth at bedtime.   Trulicity 1.5 0000000 Sopn Generic drug: Dulaglutide Inject 1.5 mg into the skin once a week. Sunday        Allergies:  Allergies  Allergen Reactions   Contrast Media  [Iodinated Contrast Media] Anaphylaxis   Iodine Swelling    (IV only) - angioedema    Family History: Family History  Problem Relation Age of Onset   Diabetes Father    Cancer Father         lung cancer   Diabetes Brother    Stroke Mother     Social History:   reports that she quit smoking about 51 years ago. Her smoking use included cigarettes. She has a 4.50 pack-year smoking history. She has never used smokeless tobacco. She reports that she does not drink alcohol and does not use drugs.  Physical Exam: There were no vitals taken for this visit.  Constitutional:  Alert and oriented, no acute distress, nontoxic appearing HEENT: [place], AT Cardiovascular: No clubbing, cyanosis, or edema Respiratory: Normal respiratory effort, no increased work of breathing Skin: No rashes, bruises or suspicious lesions Neurologic: Grossly intact, no focal deficits, moving all 4 extremities Psychiatric: Normal mood and affect  Laboratory Data: Results for orders placed or performed in visit on 02/08/23  BLADDER SCAN AMB NON-IMAGING  Result Value Ref Range   Scan Result 89 ml    Assessment & Plan:   1. Frequent urination Symptomatic improvement on trospium 20 mg daily dosed at midday.  She notes nocturia but less daytime frequency.  We discussed that undiagnosed sleep apnea may be playing a role in her nocturia and I urged her to consider pursuing a sleep study for further evaluation through her PCP.  In the meantime, we will have her switch trospium dosing to the evening.  She may follow-up with me as needed.  Dry mouth is minimally bothersome and I encouraged her to stay up-to-date with dental screenings due to increased risk for dental caries.  If this becomes more bothersome, we will manage her with Gemtesa samples instead. - BLADDER SCAN AMB NON-IMAGING - trospium (SANCTURA) 20 MG tablet; Take 1 tablet (20 mg total) by mouth at bedtime.  Dispense: 90 tablet; Refill: 3  Return in about 1 year (around 02/08/2024), or if symptoms worsen or fail to improve, for Annual OAB f/u with [facility].  Debroah Loop, PA-C  [facility] [address], [place], [place] [address] [phone]

## 2023-04-01 NOTE — Progress Notes (Unsigned)
04/02/2023 7:44 PM   Heather Lopez 09-Oct-1946 [id number]  Referring provider: Carren Rang, PA-C [address] [place],  [place] [address]  Urological history: 1. Nephrolithiasis -bilateral URS (11/2021)   2. Bilateral vesicoureteral reflux -cystogram (02/2022) - Left>right reflux   3. Right hydronephrosis -managed with stent - last exchange (12/2022)  4. OAB -contributing factors of age, GSM, chronic ureteral stent, anxiety, depression, HTN, hx of smoking and diabetes -trospium IR 20 mg qhs  No chief complaint on file.   HPI: Heather Lopez is a 77 y.o. female who presents today for three month follow up.   Previous records reviewed.   KUB ***  PMH: Past Medical History:  Diagnosis Date   Acute renal failure (HCC) 06/27/2021   Anxiety    Aortic atherosclerosis (HCC)    Cataract    Depression    Dysrhythmia    GERD (gastroesophageal reflux disease)    Gout    History of kidney stones    Hyperlipidemia    Hypertension    Nausea & vomiting 12/02/2021   Obesity    Obstruction of left ureteropelvic junction (UPJ) due to stone 06/2021   Osteoarthritis    Perforated duodenal ulcer (HCC) 06/27/2021   Pyonephrosis 06/2021   left   Septic shock (HCC) 06/27/2021   Type 2 diabetes mellitus with stage 5 chronic kidney disease (H[facility])    Vertigo     Surgical History: Past Surgical History:  Procedure Laterality Date   CATARACT EXTRACTION, BILATERAL Bilateral 2019   COLONOSCOPY WITH PROPOFOL N/A 08/19/2015   Procedure: COLONOSCOPY WITH PROPOFOL;  Surgeon: Midge Minium, MD;  Location: [facility];  Service: Endoscopy;  Laterality: N/A;  diabetic - insulin   CYSTOSCOPY W/ RETROGRADES Bilateral 12/22/2019   Procedure: CYSTOSCOPY WITH RETROGRADE PYELOGRAM;  Surgeon: Riki Altes, MD;  Location: [facility];  Service: Urology;  Laterality: Bilateral;   CYSTOSCOPY W/ URETERAL STENT PLACEMENT Bilateral 07/05/2021   Procedure: CYSTOSCOPY WITH  RETROGRADE PYELOGRAM/BILATERAL URETERAL STENT PLACEMENT;  Surgeon: Riki Altes, MD;  Location: [facility];  Service: Urology;  Laterality: Bilateral;   CYSTOSCOPY W/ URETERAL STENT PLACEMENT Bilateral 12/26/2021   Procedure: CYSTOSCOPY WITH STENT REMOVAL;  Surgeon: Riki Altes, MD;  Location: [facility];  Service: Urology;  Laterality: Bilateral;   CYSTOSCOPY W/ URETERAL STENT PLACEMENT Right 03/13/2022   Procedure: CYSTOSCOPY WITH RETROGRADE PYELOGRAM/URETERAL STENT PLACEMENT;  Surgeon: Riki Altes, MD;  Location: [facility];  Service: Urology;  Laterality: Right;   CYSTOSCOPY W/ URETERAL STENT PLACEMENT Right 01/01/2023   Procedure: CYSTOSCOPY WITH STENT EXCHANGE;  Surgeon: Riki Altes, MD;  Location: [facility];  Service: Urology;  Laterality: Right;   CYSTOSCOPY W/ URETERAL STENT REMOVAL Right 05/08/2022   Procedure: CYSTOSCOPY WITH STENT REMOVAL;  Surgeon: Riki Altes, MD;  Location: [facility];  Service: Urology;  Laterality: Right;   CYSTOSCOPY WITH BIOPSY N/A 12/22/2019   Procedure: CYSTOSCOPY WITH bladder BIOPSY;  Surgeon: Riki Altes, MD;  Location: [facility];  Service: Urology;  Laterality: N/A;   CYSTOSCOPY WITH BIOPSY  03/13/2022   Procedure: CYSTOSCOPY WITH BLADDER BIOPSY;  Surgeon: Riki Altes, MD;  Location: [facility];  Service: Urology;;   CYSTOSCOPY WITH STENT PLACEMENT Right 12/22/2019   Procedure: CYSTOSCOPY WITH STENT PLACEMENT;  Surgeon: Riki Altes, MD;  Location: [facility];  Service: Urology;  Laterality: Right;   CYSTOSCOPY WITH STENT PLACEMENT Right 07/03/2022   Procedure: CYSTOSCOPY/RIGHT URETEROSCOPY WITH STENT PLACEMENT;  Surgeon: Irineo Axon  C, MD;  Location: [facility];  Service: Urology;  Laterality: Right;   CYSTOSCOPY WITH URETEROSCOPY Bilateral 12/22/2019   Procedure: CYSTOSCOPY WITH URETEROSCOPY;  Surgeon: Riki Altes, MD;  Location: [facility];  Service: Urology;  Laterality: Bilateral;   CYSTOSCOPY/URETEROSCOPY/HOLMIUM LASER/STENT PLACEMENT  Right 12/22/2019   Procedure: CYSTOSCOPY/URETEROSCOPY/HOLMIUM LASER/STENT PLACEMENT;  Surgeon: Riki Altes, MD;  Location: [facility];  Service: Urology;  Laterality: Right;   CYSTOSCOPY/URETEROSCOPY/HOLMIUM LASER/STENT PLACEMENT Bilateral 12/08/2021   Procedure: CYSTOSCOPY/URETEROSCOPY/HOLMIUM LASER/STENT PLACEMENT;  Surgeon: Sondra Come, MD;  Location: [facility];  Service: Urology;  Laterality: Bilateral;   DIALYSIS/PERMA CATHETER INSERTION N/A 06/29/2021   Procedure: DIALYSIS/PERMA CATHETER INSERTION;  Surgeon: Annice Needy, MD;  Location: [facility];  Service: Cardiovascular;  Laterality: N/A;   DIALYSIS/PERMA CATHETER INSERTION N/A 07/13/2021   Procedure: DIALYSIS/PERMA CATHETER INSERTION;  Surgeon: Annice Needy, MD;  Location: [facility];  Service: Cardiovascular;  Laterality: N/A;   EYE SURGERY     IR GASTROSTOMY TUBE MOD SED  08/01/2021   LAPAROTOMY N/A 06/29/2021   Procedure: EXPLORATORY LAPAROTOMY WITH REPAIR OF DUODENAL PERFORATION;  Surgeon: Henrene Dodge, MD;  Location: [facility];  Service: General;  Laterality: N/A;   POLYPECTOMY  08/19/2015   Procedure: POLYPECTOMY;  Surgeon: Midge Minium, MD;  Location: [facility];  Service: Endoscopy;;   TUBAL LIGATION  1992    Home Medications:  Allergies as of 04/02/2023       Reactions   Contrast Media  [iodinated Contrast Media] Anaphylaxis   Iodine Swelling   (IV only) - angioedema        Medication List        Accurate as of Apr 01, 2023  7:44 PM. If you have any questions, ask your nurse or doctor.          aspirin EC 81 MG tablet Take 81 mg by mouth daily.   atorvastatin 20 MG tablet Commonly known as: LIPITOR Take 20 mg by mouth daily.   buPROPion 150 MG 24 hr tablet Commonly known as: WELLBUTRIN XL Take 150 mg by mouth daily.   busPIRone 5 MG tablet Commonly known as: BUSPAR Take 5 mg by mouth daily.   cloNIDine 0.1 MG tablet Commonly known as: CATAPRES Take 1 tablet (0.1 mg total)  by mouth 2 (two) times daily.   ferrous sulfate 325 (65 FE) MG tablet Take 325 mg by mouth daily with breakfast.   insulin aspart 100 UNIT/ML injection Commonly known as: novoLOG Inject 10 Units into the skin 3 (three) times daily before meals.   insulin glargine 100 UNIT/ML injection Commonly known as: LANTUS Inject 70 Units into the skin daily.   lansoprazole 30 MG capsule Commonly known as: PREVACID Take 30 mg by mouth at bedtime.   loperamide 2 MG tablet Commonly known as: IMODIUM A-D Take 2 mg by mouth every 6 (six) hours as needed for diarrhea or loose stools.   metoprolol tartrate 100 MG tablet Commonly known as: LOPRESSOR Take 1 tablet (100 mg total) by mouth 2 (two) times daily.   multivitamin with minerals Tabs tablet Take 1 tablet by mouth daily.   polyethylene glycol 17 g packet Commonly known as: MIRALAX / GLYCOLAX Take 17 g by mouth daily as needed for mild constipation or moderate constipation.   Premarin vaginal cream Generic drug: conjugated estrogens Apply one pea-sized amount around the opening of the urethra daily for 2 weeks, then 3 times weekly moving forward.   senna-docusate 8.6-50 MG tablet  Commonly known as: Senokot-S Take 1 tablet by mouth at bedtime as needed for mild constipation or moderate constipation.   trospium 20 MG tablet Commonly known as: SANCTURA Take 1 tablet (20 mg total) by mouth at bedtime.   Trulicity 1.5 MG/0.5ML Sopn Generic drug: Dulaglutide Inject 1.5 mg into the skin once a week. Sunday        Allergies:  Allergies  Allergen Reactions   Contrast Media  [Iodinated Contrast Media] Anaphylaxis   Iodine Swelling    (IV only) - angioedema    Family History: Family History  Problem Relation Age of Onset   Diabetes Father    Cancer Father        lung cancer   Diabetes Brother    Stroke Mother     Social History:  reports that she quit smoking about 51 years ago. Her smoking use included cigarettes. She has  a 4.50 pack-year smoking history. She has never used smokeless tobacco. She reports that she does not drink alcohol and does not use drugs.  ROS: Pertinent ROS in HPI  Physical Exam: There were no vitals taken for this visit.  Constitutional:  Well nourished. Alert and oriented, No acute distress. HEENT: [place] AT, moist mucus membranes.  Trachea midline, no masses. Cardiovascular: No clubbing, cyanosis, or edema. Respiratory: Normal respiratory effort, no increased work of breathing. GU: No CVA tenderness.  No bladder fullness or masses. Vulvovaginal atrophy w/ pallor, loss of rugae, introital retraction, excoriations.  Vulvar thinning, fusion of labia, clitoral hood retraction, prominent urethral meatus.   *** external genitalia, *** pubic hair distribution, no lesions.  Normal urethral meatus, no lesions, no prolapse, no discharge.   No urethral masses, tenderness and/or tenderness. No bladder fullness, tenderness or masses. *** vagina mucosa, *** estrogen effect, no discharge, no lesions, *** pelvic support, *** cystocele and *** rectocele noted.  No cervical motion tenderness.  Uterus is freely mobile and non-fixed.  No adnexal/parametria masses or tenderness noted.  Anus and perineum are without rashes or lesions.   ***  Neurologic: Grossly intact, no focal deficits, moving all 4 extremities. Psychiatric: Normal mood and affect.    Laboratory Data: Lab Results  Component Value Date   WBC 7.8 12/21/2022   HGB 11.8 (L) 12/21/2022   HCT 37.3 12/21/2022   MCV 91.2 12/21/2022   PLT 217 12/21/2022    Lab Results  Component Value Date   CREATININE 2.92 (H) 12/21/2022   Hemoglobin A1c (11/2022) 10.2  Lab Results  Component Value Date   AST 34 12/08/2022   Lab Results  Component Value Date   ALT 22 12/08/2022    Urinalysis    Component Value Date/Time   COLORURINE AMBER (A) 12/08/2022 0936   APPEARANCEUR Cloudy (A) 12/20/2022 0954   LABSPEC 1.010 12/08/2022 0936   LABSPEC 1.024  01/27/2014 1350   PHURINE 6.0 12/08/2022 0936   GLUCOSEU Negative 12/20/2022 0954   GLUCOSEU >=500 01/27/2014 1350   HGBUR LARGE (A) 12/08/2022 0936   BILIRUBINUR Negative 12/20/2022 0954   BILIRUBINUR Negative 01/27/2014 1350   KETONESUR NEGATIVE 12/08/2022 0936   PROTEINUR 3+ (A) 12/20/2022 0954   PROTEINUR >=300 (A) 12/08/2022 0936   UROBILINOGEN 0.2 02/10/2022 0907   NITRITE Negative 12/20/2022 0954   NITRITE POSITIVE (A) 12/08/2022 0936   LEUKOCYTESUR 3+ (A) 12/20/2022 0954   LEUKOCYTESUR LARGE (A) 12/08/2022 0936   LEUKOCYTESUR 2+ 01/27/2014 1350  I have reviewed the labs.   Pertinent Imaging: KUB *** I have independently reviewed the films.  See HPI.  Radiologist interpretation pending.    Assessment & Plan:  ***  1. Right hydronephrosis -managed with chronic right ureteral stent   No follow-ups on file.  These notes generated with voice recognition software. I apologize for typographical errors.  Cloretta Ned  [facility] [address]  [address] [place], [place] [address] [phone]

## 2023-04-02 ENCOUNTER — Other Ambulatory Visit: Payer: Self-pay | Admitting: *Deleted

## 2023-04-02 ENCOUNTER — Encounter: Payer: Self-pay | Admitting: Urology

## 2023-04-02 ENCOUNTER — Ambulatory Visit
Admission: RE | Admit: 2023-04-02 | Discharge: 2023-04-02 | Disposition: A | Discharge status: Home / Self Care | Payer: Medicare Other | Source: Ambulatory Visit | Attending: Urology | Admitting: Urology

## 2023-04-02 ENCOUNTER — Ambulatory Visit
Admission: RE | Admit: 2023-04-02 | Discharge: 2023-04-02 | Disposition: A | Discharge status: Home / Self Care | Payer: Medicare Other | Attending: Urology | Admitting: Urology

## 2023-04-02 ENCOUNTER — Ambulatory Visit (INDEPENDENT_AMBULATORY_CARE_PROVIDER_SITE_OTHER): Payer: Medicare Other | Admitting: Urology

## 2023-04-02 VITALS — BP 148/78 | HR 84 | Ht 63.0 in | Wt 220.0 lb

## 2023-04-02 DIAGNOSIS — N3281 Overactive bladder: Secondary | ICD-10-CM

## 2023-04-02 DIAGNOSIS — N200 Calculus of kidney: Secondary | ICD-10-CM | POA: Insufficient documentation

## 2023-04-02 DIAGNOSIS — N1339 Other hydronephrosis: Secondary | ICD-10-CM | POA: Diagnosis not present

## 2023-04-02 DIAGNOSIS — R350 Frequency of micturition: Secondary | ICD-10-CM | POA: Diagnosis not present

## 2023-04-02 MED ORDER — MIRABEGRON ER 50 MG PO TB24: 50.0000 mg | ORAL_TABLET | Freq: Every day | ORAL | 0 refills | Status: DC

## 2023-04-03 ENCOUNTER — Telehealth: Payer: Self-pay

## 2023-04-03 NOTE — Telephone Encounter (Signed)
Pt LM on triage line stating that she is down to 3 pills of trospium and would like a refill, however it appears Sam sent in a 90 days supply in March, but Shannon increased to BID.

## 2023-04-03 NOTE — Telephone Encounter (Signed)
Patient notified she has refills. She will call  pharmacy.

## 2023-04-08 ENCOUNTER — Telehealth: Payer: Self-pay | Admitting: Family Medicine

## 2023-04-08 NOTE — Telephone Encounter (Signed)
PA was done for Myrbetriq. Insurance has denied the medication. Do you want her to continue Trospium?

## 2023-04-18 ENCOUNTER — Other Ambulatory Visit: Payer: Self-pay | Admitting: Physician Assistant

## 2023-04-18 ENCOUNTER — Other Ambulatory Visit: Payer: Self-pay | Admitting: *Deleted

## 2023-04-18 DIAGNOSIS — R350 Frequency of micturition: Secondary | ICD-10-CM

## 2023-04-18 MED ORDER — TROSPIUM CHLORIDE 20 MG PO TABS: 20.0000 mg | ORAL_TABLET | Freq: Two times a day (BID) | ORAL | 3 refills | Status: AC

## 2023-06-11 NOTE — Progress Notes (Unsigned)
06/12/2023 9:55 AM   Heather Lopez 1946/09/04 [id number]  Referring provider: Carren Rang, PA-C [id number] [place],  [place] [address]  Urological history: 1. Nephrolithiasis -bilateral URS (11/2021)   2. Bilateral vesicoureteral reflux -cystogram (02/2022) - left>right reflux   3. Right hydronephrosis -managed with stent - last exchange (12/2022)  4. OAB -contributing factors of age, GSM, chronic ureteral stent, anxiety, depression, HTN, hx of smoking and diabetes -trospium IR 20 mg qhs  Chief Complaint  Patient presents with   Over Active Bladder    HPI: [name] is a 77 y.o. female who presents today for three month follow up with her husband, Heather Lopez.   Previous records reviewed.   She saw her nephrologist recently and they have ordered placement of a catheter to start dialysis.  At her visit on 04/02/2023, she has not had any issues with stent discomfort.  She does continue to have issues with urinary frequency and nocturia.  She is taking trospium IR 20 mg nightly, but she states she is not at goal with this medication.  Patient denies any modifying or aggravating factors.  Patient denies any gross hematuria, dysuria or suprapubic/flank pain.  Patient denies any fevers, chills, nausea or vomiting.  KUB right ureteral stent in place.  We increased her trospium IR to 20 mg twice daily.  We also sent a prescription in for Myrbetriq 50 mg daily, but her insurance denied coverage.  She is having 8 or more daytime voids, nocturia x 3 or more with a mild urge to urinate.  She does not have urinary leakage.  She wears 2 depends daily.  She does engage in toilet mapping.  She feels taking the trospium IR 20 mg twice daily has provided some benefit.  She states that instead of having to rush to the restroom every hour, she can now delay it to almost 2 hours.  She does admit that sometimes she will use the restroom more out of habit than actual need to  void.  Patient denies any modifying or aggravating factors.  Patient denies any recent UTI's, gross hematuria, dysuria or suprapubic/flank pain.  Patient denies any fevers, chills, nausea or vomiting.   PVR 124 mL   PMH: Past Medical History:  Diagnosis Date   Acute renal failure (HCC) 06/27/2021   Anxiety    Aortic atherosclerosis (HCC)    Cataract    Depression    Dysrhythmia    GERD (gastroesophageal reflux disease)    Gout    History of kidney stones    Hyperlipidemia    Hypertension    Nausea & vomiting 12/02/2021   Obesity    Obstruction of left ureteropelvic junction (UPJ) due to stone 06/2021   Osteoarthritis    Perforated duodenal ulcer (HCC) 06/27/2021   Pyonephrosis 06/2021   left   Septic shock (HCC) 06/27/2021   Type 2 diabetes mellitus with stage 5 chronic kidney disease (HCC)    Vertigo     Surgical History: Past Surgical History:  Procedure Laterality Date   CATARACT EXTRACTION, BILATERAL Bilateral 2019   COLONOSCOPY WITH PROPOFOL N/A 08/19/2015   Procedure: COLONOSCOPY WITH PROPOFOL;  Surgeon: Midge Minium, MD;  Location: [facility];  Service: Endoscopy;  Laterality: N/A;  diabetic - insulin   CYSTOSCOPY W/ RETROGRADES Bilateral 12/22/2019   Procedure: CYSTOSCOPY WITH RETROGRADE PYELOGRAM;  Surgeon: Riki Altes, MD;  Location: [facility];  Service: Urology;  Laterality: Bilateral;   CYSTOSCOPY W/ URETERAL STENT  PLACEMENT Bilateral 07/05/2021   Procedure: CYSTOSCOPY WITH RETROGRADE PYELOGRAM/BILATERAL URETERAL STENT PLACEMENT;  Surgeon: Riki Altes, MD;  Location: [facility];  Service: Urology;  Laterality: Bilateral;   CYSTOSCOPY W/ URETERAL STENT PLACEMENT Bilateral 12/26/2021   Procedure: CYSTOSCOPY WITH STENT REMOVAL;  Surgeon: Riki Altes, MD;  Location: [facility];  Service: Urology;  Laterality: Bilateral;   CYSTOSCOPY W/ URETERAL STENT PLACEMENT Right 03/13/2022   Procedure: CYSTOSCOPY WITH RETROGRADE PYELOGRAM/URETERAL STENT PLACEMENT;   Surgeon: Riki Altes, MD;  Location: [facility];  Service: Urology;  Laterality: Right;   CYSTOSCOPY W/ URETERAL STENT PLACEMENT Right 01/01/2023   Procedure: CYSTOSCOPY WITH STENT EXCHANGE;  Surgeon: Riki Altes, MD;  Location: [facility];  Service: Urology;  Laterality: Right;   CYSTOSCOPY W/ URETERAL STENT REMOVAL Right 05/08/2022   Procedure: CYSTOSCOPY WITH STENT REMOVAL;  Surgeon: Riki Altes, MD;  Location: [facility];  Service: Urology;  Laterality: Right;   CYSTOSCOPY WITH BIOPSY N/A 12/22/2019   Procedure: CYSTOSCOPY WITH bladder BIOPSY;  Surgeon: Riki Altes, MD;  Location: [facility];  Service: Urology;  Laterality: N/A;   CYSTOSCOPY WITH BIOPSY  03/13/2022   Procedure: CYSTOSCOPY WITH BLADDER BIOPSY;  Surgeon: Riki Altes, MD;  Location: [facility];  Service: Urology;;   CYSTOSCOPY WITH STENT PLACEMENT Right 12/22/2019   Procedure: CYSTOSCOPY WITH STENT PLACEMENT;  Surgeon: Riki Altes, MD;  Location: [facility];  Service: Urology;  Laterality: Right;   CYSTOSCOPY WITH STENT PLACEMENT Right 07/03/2022   Procedure: CYSTOSCOPY/RIGHT URETEROSCOPY WITH STENT PLACEMENT;  Surgeon: Riki Altes, MD;  Location: [facility];  Service: Urology;  Laterality: Right;   CYSTOSCOPY WITH URETEROSCOPY Bilateral 12/22/2019   Procedure: CYSTOSCOPY WITH URETEROSCOPY;  Surgeon: Riki Altes, MD;  Location: [facility];  Service: Urology;  Laterality: Bilateral;   CYSTOSCOPY/URETEROSCOPY/HOLMIUM LASER/STENT PLACEMENT Right 12/22/2019   Procedure: CYSTOSCOPY/URETEROSCOPY/HOLMIUM LASER/STENT PLACEMENT;  Surgeon: Riki Altes, MD;  Location: [facility];  Service: Urology;  Laterality: Right;   CYSTOSCOPY/URETEROSCOPY/HOLMIUM LASER/STENT PLACEMENT Bilateral 12/08/2021   Procedure: CYSTOSCOPY/URETEROSCOPY/HOLMIUM LASER/STENT PLACEMENT;  Surgeon: Sondra Come, MD;  Location: [facility];  Service: Urology;  Laterality: Bilateral;   DIALYSIS/PERMA CATHETER INSERTION N/A 06/29/2021   Procedure:  DIALYSIS/PERMA CATHETER INSERTION;  Surgeon: Annice Needy, MD;  Location: [facility];  Service: Cardiovascular;  Laterality: N/A;   DIALYSIS/PERMA CATHETER INSERTION N/A 07/13/2021   Procedure: DIALYSIS/PERMA CATHETER INSERTION;  Surgeon: Annice Needy, MD;  Location: [facility];  Service: Cardiovascular;  Laterality: N/A;   EYE SURGERY     IR GASTROSTOMY TUBE MOD SED  08/01/2021   LAPAROTOMY N/A 06/29/2021   Procedure: EXPLORATORY LAPAROTOMY WITH REPAIR OF DUODENAL PERFORATION;  Surgeon: Henrene Dodge, MD;  Location: [facility];  Service: General;  Laterality: N/A;   POLYPECTOMY  08/19/2015   Procedure: POLYPECTOMY;  Surgeon: Midge Minium, MD;  Location: [facility];  Service: Endoscopy;;   TUBAL LIGATION  1992    Home Medications:  Allergies as of 06/12/2023       Reactions   Contrast Media  [iodinated Contrast Media] Anaphylaxis   Iodine Swelling   (IV only) - angioedema        Medication List        Accurate as of June 12, 2023  9:55 AM. If you have any questions, ask your nurse or doctor.          aspirin EC 81 MG tablet Take 81 mg by mouth daily.   atorvastatin 20 MG tablet  Commonly known as: LIPITOR Take 20 mg by mouth daily.   buPROPion 150 MG 24 hr tablet Commonly known as: WELLBUTRIN XL Take 150 mg by mouth daily.   busPIRone 5 MG tablet Commonly known as: BUSPAR Take 5 mg by mouth daily.   cloNIDine 0.1 MG tablet Commonly known as: CATAPRES Take 1 tablet (0.1 mg total) by mouth 2 (two) times daily.   ferrous sulfate 325 (65 FE) MG tablet Take 325 mg by mouth daily with breakfast.   insulin aspart 100 UNIT/ML injection Commonly known as: novoLOG Inject 10 Units into the skin 3 (three) times daily before meals.   insulin glargine 100 UNIT/ML injection Commonly known as: LANTUS Inject 70 Units into the skin daily.   lansoprazole 30 MG capsule Commonly known as: PREVACID Take 30 mg by mouth at bedtime.   loperamide 2 MG  tablet Commonly known as: IMODIUM A-D Take 2 mg by mouth every 6 (six) hours as needed for diarrhea or loose stools.   metoprolol tartrate 100 MG tablet Commonly known as: LOPRESSOR Take 1 tablet (100 mg total) by mouth 2 (two) times daily.   multivitamin with minerals Tabs tablet Take 1 tablet by mouth daily.   polyethylene glycol 17 g packet Commonly known as: MIRALAX / GLYCOLAX Take 17 g by mouth daily as needed for mild constipation or moderate constipation.   Premarin vaginal cream Generic drug: conjugated estrogens Apply one pea-sized amount around the opening of the urethra daily for 2 weeks, then 3 times weekly moving forward.   senna-docusate 8.6-50 MG tablet Commonly known as: Senokot-S Take 1 tablet by mouth at bedtime as needed for mild constipation or moderate constipation.   trospium 20 MG tablet Commonly known as: SANCTURA Take 1 tablet (20 mg total) by mouth 2 (two) times daily.   Trulicity 1.5 MG/0.5ML Sopn Generic drug: Dulaglutide Inject 1.5 mg into the skin once a week. Sunday        Allergies:  Allergies  Allergen Reactions   Contrast Media  [Iodinated Contrast Media] Anaphylaxis   Iodine Swelling    (IV only) - angioedema    Family History: Family History  Problem Relation Age of Onset   Diabetes Father    Cancer Father        lung cancer   Diabetes Brother    Stroke Mother     Social History:  reports that she quit smoking about 51 years ago. Her smoking use included cigarettes. She started smoking about 57 years ago. She has a 4.5 pack-year smoking history. She has never used smokeless tobacco. She reports that she does not drink alcohol and does not use drugs.  ROS: Pertinent ROS in HPI  Physical Exam: BP 136/77   Pulse 72   Wt 220 lb (99.8 kg)   BMI 38.97 kg/m   Constitutional:  Well nourished. Alert and oriented, No acute distress. HEENT: [place] AT, moist mucus membranes.  Trachea midline Cardiovascular: No clubbing, cyanosis,  or edema. Respiratory: Normal respiratory effort, no increased work of breathing. Neurologic: Grossly intact, no focal deficits, moving all 4 extremities. Psychiatric: Normal mood and affect.    Laboratory Data: Renal Function Panel Order: [id number] Component Ref Range & Units 8 d ago  Glucose 65 - 99 mg/dL 308 High   Comment:  Fasting reference interval                                               For someone without known diabetes, a glucose value      between 100 and 125 mg/dL is consistent with      prediabetes and should be confirmed with a      follow-up test.         BUN 7 - 25 mg/dL 93 High   Creatinine 1.91 - 1.00 mg/dL 4.78 High   eGFR CKD-EPI CR 2021 > OR = 60 mL/min/1.47m2 7 Low   BUN/Creatinine Ratio 6 - 22 (calc) 16  Sodium 135 - 146 mmol/L 133 Low   Potassium 3.5 - 5.3 mmol/L 4.7  Chloride 98 - 110 mmol/L 102  Bicarbonate (CO2) 20 - 32 mmol/L 21  Calcium 8.6 - 10.4 mg/dL 9.5  Phosphorus 2.1 - 4.3 mg/dL 5.1 High   Albumin 3.6 - 5.1 g/dL 3.5 Low   Resulting Agency See order comments   Specimen Collected: 06/03/23 10:27   Performed by: Chancy Hurter Last Resulted: 06/05/23 06:25  Received From: [facility]  Result Received: 06/11/23 08:31     Urinalysis    Component Value Date/Time   COLORURINE AMBER (A) 12/08/2022 0936   APPEARANCEUR Cloudy (A) 12/20/2022 0954   LABSPEC 1.010 12/08/2022 0936   LABSPEC 1.024 01/27/2014 1350   PHURINE 6.0 12/08/2022 0936   GLUCOSEU Negative 12/20/2022 0954   GLUCOSEU >=500 01/27/2014 1350   HGBUR LARGE (A) 12/08/2022 0936   BILIRUBINUR Negative 12/20/2022 0954   BILIRUBINUR Negative 01/27/2014 1350   KETONESUR NEGATIVE 12/08/2022 0936   PROTEINUR 3+ (A) 12/20/2022 0954   PROTEINUR >=300 (A) 12/08/2022 0936   UROBILINOGEN 0.2 02/10/2022 0907   NITRITE Negative 12/20/2022 0954   NITRITE POSITIVE (A) 12/08/2022  0936   LEUKOCYTESUR 3+ (A) 12/20/2022 0954   LEUKOCYTESUR LARGE (A) 12/08/2022 0936   LEUKOCYTESUR 2+ 01/27/2014 1350  I have reviewed the labs.   Pertinent Imaging:  06/12/23 09:32  Scan Result    Assessment & Plan:    1. Right hydronephrosis -managed with chronic right ureteral stent  -She will be transitioning to dialysis in the near future and has an appointment with the surgeon for a catheter to be placed for peritoneal dialysis -Her desire is to keep the stent in place and continue exchanging, but if the surgeon feels the stent needs to come out we will plan for stent removal otherwise we will proceed with stent exchange in August/September  2. OAB -Her insurance company denied the approval of Myrbetriq -She feels she is at goal with a trospium IR 20 mg twice daily   Return for pending surgical consult .  These notes generated with voice recognition software. I apologize for typographical errors.  Cloretta Ned  [facility] [address]  [address] [place], [place] [address] [phone]

## 2023-06-12 ENCOUNTER — Encounter: Payer: Self-pay | Admitting: Urology

## 2023-06-12 ENCOUNTER — Ambulatory Visit (INDEPENDENT_AMBULATORY_CARE_PROVIDER_SITE_OTHER): Payer: Medicare Other | Admitting: Urology

## 2023-06-12 VITALS — BP 136/77 | HR 72 | Wt 220.0 lb

## 2023-06-12 DIAGNOSIS — N1330 Unspecified hydronephrosis: Secondary | ICD-10-CM | POA: Diagnosis not present

## 2023-06-12 DIAGNOSIS — N3281 Overactive bladder: Secondary | ICD-10-CM | POA: Diagnosis not present

## 2023-06-12 LAB — BLADDER SCAN AMB NON-IMAGING

## 2023-06-12 NOTE — Patient Instructions (Signed)
Please contact us after your appointment with the surgeon regarding how to proceed with your ureteral stent.  As we discussed we could either remove it and not replace it with a small chance of having infection which will need to be treated with the nephrostomy tube or another stent placement in the future if the surgeon feels that it would be safer for you to have the stent out in the setting of dialysis.  If the surgeon feels the stent can remain, please let us know so we can schedule a stent exchange.

## 2023-06-28 HISTORY — PX: INCISIONAL HERNIA REPAIR: SHX193

## 2023-07-26 ENCOUNTER — Other Ambulatory Visit: Payer: Self-pay

## 2023-07-26 ENCOUNTER — Emergency Department: Payer: Medicare Other

## 2023-07-26 ENCOUNTER — Emergency Department
Admission: EM | Admit: 2023-07-26 | Discharge: 2023-07-26 | Disposition: A | Discharge status: Home / Self Care | Payer: Medicare Other | Source: Home / Self Care | Attending: Emergency Medicine | Admitting: Emergency Medicine

## 2023-07-26 DIAGNOSIS — Z20822 Contact with and (suspected) exposure to covid-19: Secondary | ICD-10-CM | POA: Insufficient documentation

## 2023-07-26 DIAGNOSIS — N185 Chronic kidney disease, stage 5: Secondary | ICD-10-CM | POA: Insufficient documentation

## 2023-07-26 DIAGNOSIS — I120 Hypertensive chronic kidney disease with stage 5 chronic kidney disease or end stage renal disease: Secondary | ICD-10-CM | POA: Insufficient documentation

## 2023-07-26 DIAGNOSIS — N390 Urinary tract infection, site not specified: Secondary | ICD-10-CM | POA: Insufficient documentation

## 2023-07-26 DIAGNOSIS — R109 Unspecified abdominal pain: Secondary | ICD-10-CM | POA: Diagnosis present

## 2023-07-26 DIAGNOSIS — E1122 Type 2 diabetes mellitus with diabetic chronic kidney disease: Secondary | ICD-10-CM | POA: Insufficient documentation

## 2023-07-26 LAB — SARS CORONAVIRUS 2 BY RT PCR: SARS Coronavirus 2 by RT PCR: NEGATIVE

## 2023-07-26 LAB — CBC
HCT: 28.2 % — ABNORMAL LOW (ref 36.0–46.0)
Hemoglobin: 8.9 g/dL — ABNORMAL LOW (ref 12.0–15.0)
MCH: 32.4 pg (ref 26.0–34.0)
MCHC: 31.6 g/dL (ref 30.0–36.0)
MCV: 102.5 fL — ABNORMAL HIGH (ref 80.0–100.0)
Platelets: 266 10*3/uL (ref 150–400)
RBC: 2.75 MIL/uL — ABNORMAL LOW (ref 3.87–5.11)
RDW: 15.2 % (ref 11.5–15.5)
WBC: 5.6 10*3/uL (ref 4.0–10.5)
nRBC: 0 % (ref 0.0–0.2)

## 2023-07-26 LAB — URINALYSIS, COMPLETE (UACMP) WITH MICROSCOPIC
Bacteria, UA: NONE SEEN
Bilirubin Urine: NEGATIVE
Glucose, UA: NEGATIVE mg/dL
Hgb urine dipstick: NEGATIVE
Ketones, ur: NEGATIVE mg/dL
Nitrite: NEGATIVE
Protein, ur: 100 mg/dL — AB
Specific Gravity, Urine: 1.009 (ref 1.005–1.030)
Squamous Epithelial / HPF: NONE SEEN /HPF (ref 0–5)
WBC, UA: >50 WBC/hpf (ref 0–5)
pH: 6 (ref 5.0–8.0)

## 2023-07-26 LAB — COMPREHENSIVE METABOLIC PANEL
ALT: 18 U/L (ref 0–44)
AST: 15 U/L (ref 15–41)
Albumin: 2.9 g/dL — ABNORMAL LOW (ref 3.5–5.0)
Alkaline Phosphatase: 94 U/L (ref 38–126)
Anion gap: 12 (ref 5–15)
BUN: 61 mg/dL — ABNORMAL HIGH (ref 8–23)
CO2: 21 mmol/L — ABNORMAL LOW (ref 22–32)
Calcium: 8.4 mg/dL — ABNORMAL LOW (ref 8.9–10.3)
Chloride: 99 mmol/L (ref 98–111)
Creatinine, Ser: 5.79 mg/dL — ABNORMAL HIGH (ref 0.44–1.00)
GFR, Estimated: 7 mL/min — ABNORMAL LOW (ref >60)
Glucose, Bld: 201 mg/dL — ABNORMAL HIGH (ref 70–99)
Potassium: 4.3 mmol/L (ref 3.5–5.1)
Sodium: 132 mmol/L — ABNORMAL LOW (ref 135–145)
Total Bilirubin: 0.5 mg/dL (ref 0.3–1.2)
Total Protein: 6.6 g/dL (ref 6.5–8.1)

## 2023-07-26 LAB — LIPASE, BLOOD: Lipase: 40 U/L (ref 11–51)

## 2023-07-26 MED ORDER — SODIUM CHLORIDE 0.9 % IV SOLN
1.0000 g | Freq: Once | INTRAVENOUS | Status: AC
  Administered 2023-07-26: 1 g via INTRAVENOUS
  Filled 2023-07-26: qty 10

## 2023-07-26 MED ORDER — CEPHALEXIN 500 MG PO CAPS: 500.0000 mg | ORAL_CAPSULE | Freq: Two times a day (BID) | ORAL | 0 refills | Status: DC

## 2023-07-26 NOTE — ED Provider Notes (Signed)
[facility] Provider Note    Event Date/Time   First MD Initiated Contact with Patient 07/26/23 1506     (approximate)  History   Chief Complaint: Abdominal Pain  HPI  Heather Lopez is a 77 y.o. female with a past medical history of stage V CKD just darted peritoneal dialysis this week, hypertension, hyperlipidemia, gastric reflux, diabetes, presents to the emergency department for abdominal pain.  According to the patient she started peritoneal dialysis on Monday has had several sessions each time she states worsening abdominal pain after the session.  States she was having worsening abdominal pain today she called her doctor who referred her to the emergency department for evaluation.  Patient states she had her catheter placed on 8/2, has not had any issues with abdominal pain until starting peritoneal dialysis this week.  Patient denies any known fever denies any vomiting no diarrhea.  Physical Exam   Triage Vital Signs: ED Triage Vitals  Encounter Vitals Group     BP 07/26/23 1234 135/60     Systolic BP Percentile --      Diastolic BP Percentile --      Pulse Rate 07/26/23 1234 70     Resp 07/26/23 1234 16     Temp 07/26/23 1234 98.3 F (36.8 C)     Temp Source 07/26/23 1234 Oral     SpO2 07/26/23 1234 95 %     Weight 07/26/23 1227 220 lb 0.3 oz (99.8 kg)     Height 07/26/23 1227 5\' 3"  (1.6 m)     Head Circumference --      Peak Flow --      Pain Score 07/26/23 1227 0     Pain Loc --      Pain Education --      Exclude from Growth Chart --     Most recent vital signs: Vitals:   07/26/23 1234  BP: 135/60  Pulse: 70  Resp: 16  Temp: 98.3 F (36.8 C)  SpO2: 95%    General: Awake, no distress.  CV:  Good peripheral perfusion.  Regular rate and rhythm  Resp:  Normal effort.  Equal breath sounds bilaterally.  Abd:  No distention.  Soft, mild tenderness to palpation across the lower abdomen.  No rebound or guarding.  ED Results / Procedures  / Treatments   RADIOLOGY  I have reviewed and interpreted CT images.  No obvious bowel obstruction or significant amount of fluid identified in the abdomen on my evaluation.  Patient does appear to have a right ureteral stent. Radiology has read the CT scan as showing possible cystitis as well as a right ureteral stent.   MEDICATIONS ORDERED IN ED: Medications - No data to display   IMPRESSION / MDM / ASSESSMENT AND PLAN / ED COURSE  I reviewed the triage vital signs and the nursing notes.  Patient's presentation is most consistent with acute presentation with potential threat to life or bodily function.  Patient presents emergency department for lower abdominal pain intermittent over this week.  Patient just tarted dialysis this week as well.  Patient does have mild suprapubic/lower abdominal tenderness on my examination.  Denies any fever.  No vomiting or diarrhea.  Patient's workup today shows a reassuring CBC with a normal white blood cell count, reassuring chemistry creatinine largely unchanged from prior.  CT scan shows likely cystitis with a right sided ureteral stent that is well-positioned.  Patient's urinalysis shows greater than 50 white cells with  white blood cell clumps likely indicating urinary tract infection remainder of the patient's workup shows no significant findings creatinine largely at baseline, CBC is normal to normal white blood cell count COVID test is negative.  I spoke with [place] as well as Dr. Thedore Mins of nephrology.  They state given the patient is afebrile with a normal white blood cell count and a urinary tract infection they recommend treating the patient outpatient with Keflex 500 mg twice daily for 7 days.  They do not believe that a PD catheter sample needs to be sent.  I spoke to urology Dr. Rush Landmark, he believes that it is reasonable to leave the stent in place until the infection is treated.  Will discharge with outpatient antibiotics.  Patient agreeable to plan of  care and will follow-up with Dr. Wynelle Link and Dr. Lonna Cobb.  FINAL CLINICAL IMPRESSION(S) / ED DIAGNOSES   Urinary tract infection  Rx / DC Orders   Keflex 500 mg twice daily x 7 days  Note:  This document was prepared using Dragon voice recognition software and may include unintentional dictation errors.   Minna Antis, MD 07/26/23 1949

## 2023-07-26 NOTE — Discharge Instructions (Signed)
Please take your antibiotic as prescribed twice daily for the next 7 days starting tomorrow 07/27/2023.  Return to the emergency department for any worsening pain, any fever, or any other symptom concerning to yourself.  Please follow-up with Dr. Wynelle Link as well as Dr. Lonna Cobb for further treatment.

## 2023-07-26 NOTE — Progress Notes (Signed)
[facility]  ROUNDING NOTE   Subjective:   [name] was admitted to [facility] on 07/26/2023 for abd pain  Patient started peritoneal dialysis training this week. However patient has developed severe suprapubic abdominal pain with peritoneal dialysis.   Patient states the pain improved after several hours.   CT with stranding of the right ureter.   Objective:  Vital signs in last 24 hours:  Temp:  [98.3 F (36.8 C)] 98.3 F (36.8 C) (08/30 1234) Pulse Rate:  [70] 70 (08/30 1234) Resp:  [16] 16 (08/30 1234) BP: (135)/(60) 135/60 (08/30 1234) SpO2:  [95 %] 95 % (08/30 1234) Weight:  [99.8 kg] 99.8 kg (08/30 1227)  Weight change:  Filed Weights   07/26/23 1227  Weight: 99.8 kg    Intake/Output: No intake/output data recorded.   Intake/Output this shift:  No intake/output data recorded.  Physical Exam: General: NAD, sitting up in wheelchair  Head: Normocephalic, atraumatic. Moist oral mucosal membranes  Eyes: Anicteric, PERRL  Neck: Supple, trachea midline  Lungs:  Clear to auscultation  Heart: Regular rate and rhythm  Abdomen:  Soft, nontender  Extremities:  no peripheral edema. Exit site nontender  Neurologic: Nonfocal, moving all four extremities  Skin: No lesions  Access: Peritoneal dialysis catheter    Basic Metabolic Panel: Recent Labs  Lab 07/26/23 1233  NA 132*  K 4.3  CL 99  CO2 21*  GLUCOSE 201*  BUN 61*  CREATININE 5.79*  CALCIUM 8.4*    Liver Function Tests: Recent Labs  Lab 07/26/23 1233  AST 15  ALT 18  ALKPHOS 94  BILITOT 0.5  PROT 6.6  ALBUMIN 2.9*   Recent Labs  Lab 07/26/23 1233  LIPASE 40   No results for input(s): "AMMONIA" in the last 168 hours.  CBC: Recent Labs  Lab 07/26/23 1233  WBC 5.6  HGB 8.9*  HCT 28.2*  MCV 102.5*  PLT 266    Cardiac Enzymes: No results for input(s): "CKTOTAL", "CKMB", "CKMBINDEX", "TROPONINI" in the last 168 hours.  BNP: Invalid input(s): "POCBNP"  CBG: No  results for input(s): "GLUCAP" in the last 168 hours.  Microbiology: Results for orders placed or performed during the hospital encounter of 07/26/23  SARS Coronavirus 2 by RT PCR (hospital order, performed in [facility] hospital lab) *cepheid single result test* Anterior Nasal Swab     Status: None   Collection Time: 07/26/23 12:33 PM   Specimen: Anterior Nasal Swab  Result Value Ref Range Status   SARS Coronavirus 2 by RT PCR NEGATIVE NEGATIVE Final    Comment: (NOTE) SARS-CoV-2 target nucleic acids are NOT DETECTED.  The SARS-CoV-2 RNA is generally detectable in upper and lower respiratory specimens during the acute phase of infection. The lowest concentration of SARS-CoV-2 viral copies this assay can detect is 250 copies / mL. A negative result does not preclude SARS-CoV-2 infection and should not be used as the sole basis for treatment or other patient management decisions.  A negative result may occur with improper specimen collection / handling, submission of specimen other than nasopharyngeal swab, presence of viral mutation(s) within the areas targeted by this assay, and inadequate number of viral copies (<250 copies / mL). A negative result must be combined with clinical observations, patient history, and epidemiological information.  Fact Sheet for Patients:   RoadLapTop.co.za  Fact Sheet for Healthcare Providers: http://kim-miller.com/  This test is not yet approved or  cleared by the [place] FDA and has been authorized for detection  and/or diagnosis of SARS-CoV-2 by FDA under an Emergency Use Authorization (EUA).  This EUA will remain in effect (meaning this test can be used) for the duration of the COVID-19 declaration under Section 564(b)(1) of the Act, 21 U.S.C. section 360bbb-3(b)(1), unless the authorization is terminated or revoked sooner.  Performed at [facility], [address]  [place], [place] [address]     Coagulation Studies: No results for input(s): "LABPROT", "INR" in the last 72 hours.  Urinalysis: No results for input(s): "COLORURINE", "LABSPEC", "PHURINE", "GLUCOSEU", "HGBUR", "BILIRUBINUR", "KETONESUR", "PROTEINUR", "UROBILINOGEN", "NITRITE", "LEUKOCYTESUR" in the last 72 hours.  Invalid input(s): "APPERANCEUR"    Imaging: CT ABDOMEN PELVIS WO CONTRAST  Result Date: 07/26/2023 CLINICAL DATA:  Abdominal pain. Just starting peritoneal dialysis. Episode of fevers and chills EXAM: CT ABDOMEN AND PELVIS WITHOUT CONTRAST TECHNIQUE: Multidetector CT imaging of the abdomen and pelvis was performed following the standard protocol without IV contrast. RADIATION DOSE REDUCTION: This exam was performed according to the departmental dose-optimization program which includes automated exposure control, adjustment of the mA and/or kV according to patient size and/or use of iterative reconstruction technique. COMPARISON:  CT 03/08/2022 older exams as well FINDINGS: Lower chest: There is some linear opacity lung bases likely scar or atelectasis. No pleural effusion. Trace pericardial effusion. Coronary artery calcifications are seen. Hepatobiliary: On this non IV contrast exam, grossly preserved hepatic parenchyma. Gallbladder has some high density material dependently. Possible stones or sludge. The gallbladder is nondilated Pancreas: Unremarkable. No pancreatic ductal dilatation or surrounding inflammatory changes. Spleen: Normal in size without focal abnormality. Adrenals/Urinary Tract: The adrenal glands are preserved moderate atrophy of the left kidney with some punctate calcification superiorly. Perinephric stranding. Mild ectasia of the left renal collecting system down to the bladder. No ureteral stones right-sided perinephric stranding identified. There is some urothelial thickening of the renal pelvis with dilatation the level of the dilatation is decreased from the prior  examination from 2023 but there is an indwelling ureteral stent in place. Proximal pigtail in the upper anterior right kidney and distal just into the bladder at the UVJ. No adjacent ureteral stones next of the stent clearly seen. Nonobstructing small 5 mm lower pole right-sided renal stone bladder is mildly distended. Mild wall thickening with the adjacent stranding. Please correlate for any evidence of cystitis. Stomach/Bowel: On this non oral contrast exam, the large bowel has a normal course and caliber with scattered stool normal appendix extends posterior to the cecum in the anterior right hemipelvis. Stomach is underdistended small bowel is nondilated Vascular/Lymphatic: Diffuse vascular calcifications identified including the aorta and branch vessels. Specific calcifications along small caliber vessels as well. Normal caliber IVC. No specific abnormal lymph node enlargement identified in the abdomen and pelvis Reproductive: Lobular uterus with calcifications. No separate adnexal mass Other: Peritoneal dialysis catheter in place. This enters the anterior pelvic wall in has the pigtail extending into the central pelvic mesentery. There is a small amount of adjacent fluid and a few bubbles of air there is skin thickening identified along the anterior abdominal wall with some stranding. Please correlate for any clinical findings such as cellulitis. No soft tissue gas Musculoskeletal: Moderate degenerative changes along the spine with multilevel disc bulging, osteophytes and stenosis. There is some retrolisthesis seen of L2 on L3. Advanced degenerative changes seen of the hips as well IMPRESSION: Peritoneal dialysis catheter in place with the pigtail along the central pelvic mesentery with some mild adjacent fluid, stranding and air no loculated fluid collections or frank ascites  No bowel obstruction. Scattered stool. Normal appendix. Few colonic diverticula. Indwelling right-sided ureteral stent. There is some  mild ectasia of the right renal collecting system but decreased from the previous examination. There is significant stranding adjacent to the renal pelvis and proximal ureter with some urothelial thickening. Please correlate for any clinical signs of infection or other process. The bladder itself does have wall thickening and stranding as well. Bilateral nonobstructing renal stones. No ureteral stones including adjacent to the ureteral stent. Gallstones or sludge in the nondilated gallbladder. Skin thickening along the anterior pelvic wall with some stranding. No soft tissue gas please correlate for any soft tissue abnormality or infection Electronically Signed   By: Karen Kays M.D.   On: 07/26/2023 16:05     Medications:       Assessment/ Plan:  [name] is a 77 y.o.  female with end stage renal disease on peritoneal dialysis, hypertension, GERD, gout, hyeprlipidemia, diabetes mellitus type II and vertigo who presents to [facility] on 07/26/2023 for abd pain  End Stage Renal Disease: just started peritoneal dialysis training.  - will do a drain looking for peritonitis.   Abdominal pain: differential does include peritonitis but CT is suggestive of urinary tract infection.  - Consult Urology.   Hypertension with chronic kidney disease: blood pressure currently at goal.  - Current regimen of metoprolol only.   Diabetes mellitus type II with chronic kidney disease: insulin dependent.  - Continue glucose control.   Anemia with chronic kidney disease: macrocytic. Scheduled for ESA and IV iron as outpatient.    LOS: 0 Maury Bamba 8/30/20244:26 PM

## 2023-07-26 NOTE — ED Triage Notes (Signed)
Was over at [facility], just starting peritoneal dialysis.  After session, patient c/o fever and chills.  Hx CKD IV.  Sent to ED to be checked for COVID and evaluate abd.  PD cath placed 8/2 at the same time a hernia repoar was done.  No fevers today.  Denies c/O abd pain. Denies any fevers.

## 2023-07-26 NOTE — Progress Notes (Signed)
Orders to obtain PD fluid for cell count. Patient hesitant to have dialysate instilled being reassured that she did not have peritonitis, but a UTI. She questioned if she could refuse, and contacting Dr. Wynelle Link informing of situation patient was informed that she could refuse, however, agreed. HD nurse instilled dialysate, informing that she must wait to be drain to collect the sample. HD nurse returned to patient room, finding the room empty. After speaking with the unit secretary, informed that patient discharged. No further action.

## 2023-07-27 LAB — URINE CULTURE: Culture: NO GROWTH

## 2023-07-30 ENCOUNTER — Emergency Department: Payer: Medicare Other

## 2023-07-30 ENCOUNTER — Inpatient Hospital Stay
Admission: EM | Admit: 2023-07-30 | Discharge: 2023-08-05 | DRG: 907 | Disposition: A | Discharge status: Home Health | Payer: Medicare Other | Attending: Internal Medicine | Admitting: Internal Medicine

## 2023-07-30 ENCOUNTER — Other Ambulatory Visit: Payer: Self-pay

## 2023-07-30 DIAGNOSIS — E785 Hyperlipidemia, unspecified: Secondary | ICD-10-CM | POA: Diagnosis present

## 2023-07-30 DIAGNOSIS — R188 Other ascites: Secondary | ICD-10-CM | POA: Diagnosis present

## 2023-07-30 DIAGNOSIS — Z6839 Body mass index (BMI) 39.0-39.9, adult: Secondary | ICD-10-CM | POA: Diagnosis not present

## 2023-07-30 DIAGNOSIS — A419 Sepsis, unspecified organism: Secondary | ICD-10-CM | POA: Diagnosis present

## 2023-07-30 DIAGNOSIS — K652 Spontaneous bacterial peritonitis: Secondary | ICD-10-CM | POA: Diagnosis present

## 2023-07-30 DIAGNOSIS — E1165 Type 2 diabetes mellitus with hyperglycemia: Secondary | ICD-10-CM | POA: Diagnosis not present

## 2023-07-30 DIAGNOSIS — N186 End stage renal disease: Secondary | ICD-10-CM

## 2023-07-30 DIAGNOSIS — K659 Peritonitis, unspecified: Secondary | ICD-10-CM | POA: Diagnosis not present

## 2023-07-30 DIAGNOSIS — Z794 Long term (current) use of insulin: Secondary | ICD-10-CM

## 2023-07-30 DIAGNOSIS — Z91041 Radiographic dye allergy status: Secondary | ICD-10-CM

## 2023-07-30 DIAGNOSIS — R109 Unspecified abdominal pain: Secondary | ICD-10-CM | POA: Diagnosis present

## 2023-07-30 DIAGNOSIS — E1122 Type 2 diabetes mellitus with diabetic chronic kidney disease: Secondary | ICD-10-CM | POA: Diagnosis present

## 2023-07-30 DIAGNOSIS — Z9842 Cataract extraction status, left eye: Secondary | ICD-10-CM

## 2023-07-30 DIAGNOSIS — I700 Atherosclerosis of aorta: Secondary | ICD-10-CM | POA: Diagnosis present

## 2023-07-30 DIAGNOSIS — Z1621 Resistance to vancomycin: Secondary | ICD-10-CM | POA: Diagnosis present

## 2023-07-30 DIAGNOSIS — Z992 Dependence on renal dialysis: Secondary | ICD-10-CM | POA: Diagnosis not present

## 2023-07-30 DIAGNOSIS — K219 Gastro-esophageal reflux disease without esophagitis: Secondary | ICD-10-CM | POA: Diagnosis present

## 2023-07-30 DIAGNOSIS — F419 Anxiety disorder, unspecified: Secondary | ICD-10-CM | POA: Diagnosis present

## 2023-07-30 DIAGNOSIS — B952 Enterococcus as the cause of diseases classified elsewhere: Secondary | ICD-10-CM | POA: Diagnosis present

## 2023-07-30 DIAGNOSIS — K5900 Constipation, unspecified: Secondary | ICD-10-CM | POA: Diagnosis present

## 2023-07-30 DIAGNOSIS — M109 Gout, unspecified: Secondary | ICD-10-CM | POA: Diagnosis present

## 2023-07-30 DIAGNOSIS — Z87442 Personal history of urinary calculi: Secondary | ICD-10-CM

## 2023-07-30 DIAGNOSIS — T85611A Breakdown (mechanical) of intraperitoneal dialysis catheter, initial encounter: Principal | ICD-10-CM | POA: Diagnosis present

## 2023-07-30 DIAGNOSIS — Y812 Prosthetic and other implants, materials and accessory general- and plastic-surgery devices associated with adverse incidents: Secondary | ICD-10-CM | POA: Diagnosis present

## 2023-07-30 DIAGNOSIS — L89151 Pressure ulcer of sacral region, stage 1: Secondary | ICD-10-CM | POA: Diagnosis present

## 2023-07-30 DIAGNOSIS — I120 Hypertensive chronic kidney disease with stage 5 chronic kidney disease or end stage renal disease: Secondary | ICD-10-CM | POA: Diagnosis present

## 2023-07-30 DIAGNOSIS — E875 Hyperkalemia: Secondary | ICD-10-CM | POA: Diagnosis present

## 2023-07-30 DIAGNOSIS — Z7985 Long-term (current) use of injectable non-insulin antidiabetic drugs: Secondary | ICD-10-CM

## 2023-07-30 DIAGNOSIS — N1330 Unspecified hydronephrosis: Secondary | ICD-10-CM | POA: Diagnosis present

## 2023-07-30 DIAGNOSIS — E119 Type 2 diabetes mellitus without complications: Secondary | ICD-10-CM

## 2023-07-30 DIAGNOSIS — Z79890 Hormone replacement therapy (postmenopausal): Secondary | ICD-10-CM

## 2023-07-30 DIAGNOSIS — Z9841 Cataract extraction status, right eye: Secondary | ICD-10-CM

## 2023-07-30 DIAGNOSIS — F32A Depression, unspecified: Secondary | ICD-10-CM | POA: Diagnosis present

## 2023-07-30 DIAGNOSIS — Z9889 Other specified postprocedural states: Secondary | ICD-10-CM | POA: Diagnosis not present

## 2023-07-30 DIAGNOSIS — E669 Obesity, unspecified: Secondary | ICD-10-CM | POA: Diagnosis present

## 2023-07-30 DIAGNOSIS — Z833 Family history of diabetes mellitus: Secondary | ICD-10-CM

## 2023-07-30 DIAGNOSIS — Z87891 Personal history of nicotine dependence: Secondary | ICD-10-CM | POA: Diagnosis not present

## 2023-07-30 DIAGNOSIS — Z7982 Long term (current) use of aspirin: Secondary | ICD-10-CM

## 2023-07-30 DIAGNOSIS — D631 Anemia in chronic kidney disease: Secondary | ICD-10-CM | POA: Diagnosis present

## 2023-07-30 DIAGNOSIS — Z79899 Other long term (current) drug therapy: Secondary | ICD-10-CM

## 2023-07-30 DIAGNOSIS — I10 Essential (primary) hypertension: Secondary | ICD-10-CM | POA: Diagnosis present

## 2023-07-30 DIAGNOSIS — R1084 Generalized abdominal pain: Secondary | ICD-10-CM | POA: Diagnosis not present

## 2023-07-30 DIAGNOSIS — M1990 Unspecified osteoarthritis, unspecified site: Secondary | ICD-10-CM | POA: Diagnosis present

## 2023-07-30 LAB — CBC WITH DIFFERENTIAL/PLATELET
Abs Immature Granulocytes: 0.07 10*3/uL (ref 0.00–0.07)
Basophils Absolute: 0.1 10*3/uL (ref 0.0–0.1)
Basophils Relative: 0 %
Eosinophils Absolute: 0.1 10*3/uL (ref 0.0–0.5)
Eosinophils Relative: 1 %
HCT: 37.7 % (ref 36.0–46.0)
Hemoglobin: 11.6 g/dL — ABNORMAL LOW (ref 12.0–15.0)
Immature Granulocytes: 1 %
Lymphocytes Relative: 10 %
Lymphs Abs: 1.5 10*3/uL (ref 0.7–4.0)
MCH: 32.3 pg (ref 26.0–34.0)
MCHC: 30.8 g/dL (ref 30.0–36.0)
MCV: 105 fL — ABNORMAL HIGH (ref 80.0–100.0)
Monocytes Absolute: 0.8 10*3/uL (ref 0.1–1.0)
Monocytes Relative: 6 %
Neutro Abs: 11.5 10*3/uL — ABNORMAL HIGH (ref 1.7–7.7)
Neutrophils Relative %: 82 %
Platelets: 408 10*3/uL — ABNORMAL HIGH (ref 150–400)
RBC: 3.59 MIL/uL — ABNORMAL LOW (ref 3.87–5.11)
RDW: 15.1 % (ref 11.5–15.5)
WBC: 14 10*3/uL — ABNORMAL HIGH (ref 4.0–10.5)
nRBC: 0 % (ref 0.0–0.2)

## 2023-07-30 LAB — URINALYSIS, W/ REFLEX TO CULTURE (INFECTION SUSPECTED)
Bacteria, UA: NONE SEEN
Bilirubin Urine: NEGATIVE
Glucose, UA: 50 mg/dL — AB
Hgb urine dipstick: NEGATIVE
Ketones, ur: NEGATIVE mg/dL
Nitrite: NEGATIVE
Protein, ur: 100 mg/dL — AB
Specific Gravity, Urine: 1.011 (ref 1.005–1.030)
Squamous Epithelial / HPF: NONE SEEN /HPF (ref 0–5)
WBC, UA: >50 WBC/hpf (ref 0–5)
pH: 6 (ref 5.0–8.0)

## 2023-07-30 LAB — COMPREHENSIVE METABOLIC PANEL
ALT: 26 U/L (ref 0–44)
AST: 16 U/L (ref 15–41)
Albumin: 3.4 g/dL — ABNORMAL LOW (ref 3.5–5.0)
Alkaline Phosphatase: 103 U/L (ref 38–126)
Anion gap: 14 (ref 5–15)
BUN: 63 mg/dL — ABNORMAL HIGH (ref 8–23)
CO2: 20 mmol/L — ABNORMAL LOW (ref 22–32)
Calcium: 9.2 mg/dL (ref 8.9–10.3)
Chloride: 99 mmol/L (ref 98–111)
Creatinine, Ser: 5.38 mg/dL — ABNORMAL HIGH (ref 0.44–1.00)
GFR, Estimated: 8 mL/min — ABNORMAL LOW (ref >60)
Glucose, Bld: 232 mg/dL — ABNORMAL HIGH (ref 70–99)
Potassium: 4.8 mmol/L (ref 3.5–5.1)
Sodium: 133 mmol/L — ABNORMAL LOW (ref 135–145)
Total Bilirubin: 0.6 mg/dL (ref 0.3–1.2)
Total Protein: 7.8 g/dL (ref 6.5–8.1)

## 2023-07-30 LAB — LACTIC ACID, PLASMA: Lactic Acid, Venous: 1.8 mmol/L (ref 0.5–1.9)

## 2023-07-30 MED ORDER — SODIUM CHLORIDE 0.9% FLUSH
3.0000 mL | Freq: Two times a day (BID) | INTRAVENOUS | Status: DC
  Administered 2023-07-30 – 2023-08-03 (×4): 3 mL via INTRAVENOUS

## 2023-07-30 MED ORDER — ACETAMINOPHEN 325 MG PO TABS
650.0000 mg | ORAL_TABLET | Freq: Four times a day (QID) | ORAL | Status: DC | PRN
  Administered 2023-07-30 – 2023-08-03 (×2): 650 mg via ORAL
  Filled 2023-07-30 (×2): qty 2

## 2023-07-30 MED ORDER — MORPHINE SULFATE (PF) 4 MG/ML IV SOLN
4.0000 mg | Freq: Once | INTRAVENOUS | Status: AC
  Administered 2023-07-30: 4 mg via INTRAVENOUS
  Filled 2023-07-30: qty 1

## 2023-07-30 MED ORDER — SODIUM CHLORIDE 0.9% FLUSH: 3.0000 mL | INTRAVENOUS | Status: DC | PRN

## 2023-07-30 MED ORDER — ACETAMINOPHEN 650 MG RE SUPP: 650.0000 mg | Freq: Four times a day (QID) | RECTAL | Status: DC | PRN

## 2023-07-30 MED ORDER — SODIUM CHLORIDE 0.9% FLUSH
3.0000 mL | Freq: Two times a day (BID) | INTRAVENOUS | Status: DC
  Administered 2023-07-30 – 2023-08-03 (×3): 3 mL via INTRAVENOUS

## 2023-07-30 MED ORDER — ATORVASTATIN CALCIUM 20 MG PO TABS
20.0000 mg | ORAL_TABLET | Freq: Every day | ORAL | Status: DC
  Administered 2023-07-31 – 2023-08-05 (×6): 20 mg via ORAL
  Filled 2023-07-30 (×6): qty 1

## 2023-07-30 MED ORDER — FLUCONAZOLE 100 MG PO TABS
100.0000 mg | ORAL_TABLET | Freq: Every day | ORAL | Status: DC
  Administered 2023-07-30 – 2023-08-01 (×3): 100 mg via ORAL
  Filled 2023-07-30 (×3): qty 1

## 2023-07-30 MED ORDER — MORPHINE SULFATE (PF) 2 MG/ML IV SOLN
2.0000 mg | INTRAVENOUS | Status: DC | PRN
  Administered 2023-07-30 – 2023-07-31 (×3): 2 mg via INTRAVENOUS
  Filled 2023-07-30 (×3): qty 1

## 2023-07-30 MED ORDER — ALTEPLASE 2 MG IJ SOLR
2.0000 mg | Freq: Once | INTRAMUSCULAR | Status: AC
  Administered 2023-07-31: 10 mg
  Filled 2023-07-30: qty 2
  Filled 2023-07-30: qty 6
  Filled 2023-07-30: qty 2

## 2023-07-30 MED ORDER — VANCOMYCIN HCL IN DEXTROSE 1-5 GM/200ML-% IV SOLN
1000.0000 mg | Freq: Once | INTRAVENOUS | Status: AC
  Administered 2023-07-30: 1000 mg via INTRAVENOUS

## 2023-07-30 MED ORDER — ASPIRIN 81 MG PO TBEC
81.0000 mg | DELAYED_RELEASE_TABLET | Freq: Every day | ORAL | Status: DC
  Administered 2023-07-31 – 2023-08-01 (×2): 81 mg via ORAL
  Filled 2023-07-30 (×2): qty 1

## 2023-07-30 MED ORDER — HEPARIN SODIUM (PORCINE) 5000 UNIT/ML IJ SOLN
5000.0000 [IU] | Freq: Two times a day (BID) | INTRAMUSCULAR | Status: DC
  Administered 2023-07-30 – 2023-08-04 (×10): 5000 [IU] via SUBCUTANEOUS
  Filled 2023-07-30 (×10): qty 1

## 2023-07-30 MED ORDER — METOPROLOL TARTRATE 50 MG PO TABS
100.0000 mg | ORAL_TABLET | Freq: Two times a day (BID) | ORAL | Status: DC
  Administered 2023-07-30 – 2023-08-04 (×11): 100 mg via ORAL
  Filled 2023-07-30 (×11): qty 2

## 2023-07-30 MED ORDER — CLONIDINE HCL 0.1 MG PO TABS
0.1000 mg | ORAL_TABLET | Freq: Two times a day (BID) | ORAL | Status: DC
  Administered 2023-07-30 – 2023-08-04 (×11): 0.1 mg via ORAL
  Filled 2023-07-30 (×11): qty 1

## 2023-07-30 MED ORDER — BUPROPION HCL ER (XL) 150 MG PO TB24
150.0000 mg | ORAL_TABLET | Freq: Every day | ORAL | Status: DC
  Administered 2023-07-31 – 2023-08-05 (×6): 150 mg via ORAL
  Filled 2023-07-30 (×6): qty 1

## 2023-07-30 MED ORDER — SODIUM CHLORIDE 0.9 % IV SOLN
1.0000 g | Freq: Once | INTRAVENOUS | Status: AC
  Administered 2023-07-30: 1 g via INTRAVENOUS
  Filled 2023-07-30: qty 10

## 2023-07-30 MED ORDER — SODIUM CHLORIDE 0.9 % IV SOLN: 250.0000 mL | INTRAVENOUS | Status: DC | PRN

## 2023-07-30 NOTE — ED Provider Notes (Signed)
[facility] Provider Note    Event Date/Time   First MD Initiated Contact with Patient 07/30/23 1904     (approximate)   History   Chief Complaint Abnormal Lab, Vascular Access Problem, and perotinitis rule out (Sent by [facility])   HPI  SKYLENE OTTMAR is a 77 y.o. female with past medical history of hypertension, hyperlipidemia, diabetes, and ESRD on PD who presents to the ED complaining of abdominal pain.  Patient reports that she has had increasing pain across her entire abdomen for about the past 5 days.  She was seen in the ED for the symptoms 4 days ago, diagnosed with a UTI at that time.  Her symptoms have continued to worsen since then, meanwhile she has been in the process of initiating peritoneal dialysis.  She went for a PD session today and had fluid instilled into her abdomen, then when there was fluid drawn off it was sent for cell count and culture.  Her nephrologist contacted her to let her know that this was concerning for infection and she was referred to the ED for admission.     Physical Exam   Triage Vital Signs: ED Triage Vitals  Encounter Vitals Group     BP 07/30/23 1719 (!) 152/61     Systolic BP Percentile --      Diastolic BP Percentile --      Pulse Rate 07/30/23 1719 72     Resp 07/30/23 1719 17     Temp 07/30/23 1719 99.8 F (37.7 C)     Temp src --      SpO2 07/30/23 1719 97 %     Weight 07/30/23 1726 220 lb 0.3 oz (99.8 kg)     Height 07/30/23 1726 5\' 3"  (1.6 m)     Head Circumference --      Peak Flow --      Pain Score 07/30/23 1723 7     Pain Loc --      Pain Education --      Exclude from Growth Chart --     Most recent vital signs: Vitals:   07/30/23 1719  BP: (!) 152/61  Pulse: 72  Resp: 17  Temp: 99.8 F (37.7 C)  SpO2: 97%    Constitutional: Alert and oriented. Eyes: Conjunctivae are normal. Head: Atraumatic. Nose: No congestion/rhinnorhea. Mouth/Throat: Mucous membranes are moist.  Cardiovascular:  Normal rate, regular rhythm. Grossly normal heart sounds.  2+ radial pulses bilaterally. Respiratory: Normal respiratory effort.  No retractions. Lungs CTAB. Gastrointestinal: Soft and distended with fluid, diffusely tender to palpation.  No erythema, warmth, or drainage at PD catheter site. Musculoskeletal: No lower extremity tenderness nor edema.  Neurologic:  Normal speech and language. No gross focal neurologic deficits are appreciated.    ED Results / Procedures / Treatments   Labs (all labs ordered are listed, but only abnormal results are displayed) Labs Reviewed  COMPREHENSIVE METABOLIC PANEL - Abnormal; Notable for the following components:      Result Value   Sodium 133 (*)    CO2 20 (*)    Glucose, Bld 232 (*)    BUN 63 (*)    Creatinine, Ser 5.38 (*)    Albumin 3.4 (*)    GFR, Estimated 8 (*)    All other components within normal limits  CBC WITH DIFFERENTIAL/PLATELET - Abnormal; Notable for the following components:   WBC 14.0 (*)    RBC 3.59 (*)    Hemoglobin 11.6 (*)  MCV 105.0 (*)    Platelets 408 (*)    Neutro Abs 11.5 (*)    All other components within normal limits  CULTURE, BLOOD (ROUTINE X 2)  CULTURE, BLOOD (ROUTINE X 2)  LACTIC ACID, PLASMA  URINALYSIS, W/ REFLEX TO CULTURE (INFECTION SUSPECTED)     EKG  ED ECG REPORT I, Chesley Noon, the attending physician, personally viewed and interpreted this ECG.   Date: 07/30/2023  EKG Time: 17:25  Rate: 74  Rhythm: normal sinus rhythm  Axis: LAD  Intervals:none  ST&T Change: None  PROCEDURES:  Critical Care performed: No  Procedures   MEDICATIONS ORDERED IN ED: Medications  vancomycin (VANCOCIN) IVPB 1000 mg/200 mL premix (1,000 mg Intravenous New Bag/Given 07/30/23 2015)  ceFEPIme (MAXIPIME) 1 g in sodium chloride 0.9 % 100 mL IVPB (0 g Intravenous Stopped 07/30/23 2007)  morphine (PF) 4 MG/ML injection 4 mg (4 mg Intravenous Given 07/30/23 1937)     IMPRESSION / MDM / ASSESSMENT AND PLAN  / ED COURSE  I reviewed the triage vital signs and the nursing notes.                              77 y.o. female with past medical history of hypertension, hyperlipidemia, diabetes, and ESRD on PD who presents to the ED with increasing abdominal pain diffusely over the past 5 days.  Patient's presentation is most consistent with acute presentation with potential threat to life or bodily function.  Differential diagnosis includes, but is not limited to, peritonitis, UTI, bowel obstruction, anemia, electrolyte abnormality, AKI.  Patient uncomfortable appearing but nontoxic and in no acute distress, vital signs are unremarkable.  Labs show leukocytosis and patient with borderline fever at 99.8, blood cultures and lactic acid drawn but lactic acid within normal limits.  No significant anemia or electrolyte abnormality, renal function consistent with known ESRD.  I spoke with patient's nephrologist, Dr. Wynelle Link, who confirmed that peritoneal fluid concerning for peritonitis and recommends IV antibiotics.  This was initiated and case discussed with hospitalist for admission.      FINAL CLINICAL IMPRESSION(S) / ED DIAGNOSES   Final diagnoses:  Peritonitis (HCC)  ESRD on peritoneal dialysis [facility])     Rx / DC Orders   ED Discharge Orders     None        Note:  This document was prepared using Dragon voice recognition software and may include unintentional dictation errors.   Chesley Noon, MD 07/30/23 2038

## 2023-07-30 NOTE — H&P (Addendum)
History and Physical    Patient: Heather Lopez ZOX:[id number] DOB: 10/27/1946 DOA: 07/30/2023 DOS: the patient was seen and examined on 07/31/2023 PCP: Carren Rang, PA-C  Patient coming from: Dr. Alice Rieger office.  Chief Complaint:  Chief Complaint  Patient presents with   Abnormal Lab   Vascular Access Problem   perotinitis rule out    Sent by [facility]    HPI: Heather Lopez is a 77 y.o. female with medical history significant for end-stage renal disease on peritoneal dialysis, hypertension, gout, GERD, perforated duodenal ulcer, diabetes mellitus type 2, patient coming to [place] for abdominal pain that is been going on for about 7 days.  She was at her nephrology appointment today and had abdominal pain and peritoneal fluid collected was positive for it concerning for for peritonitis and patient was referred to the emergency room for the same.  Patient has been having abdominal pain nearly a week since starting her PD training.  In the emergency room blood cultures collected and sent.  Patient required pain medication for her abdominal pain. Chart review shows patient follows with Dr. Wynelle Link for her end-stage renal disease.  In ED patient is alert awake afebrile hypertensive with blood pressure 152/61 temperature 99.8 O2 sats of 97% on room air.  Initial EKG shows sinus rhythm at 74 with left axis deviation and no ST-T wave changes. Metabolic panel shows sodium of 133 glucose of 232 creatinine 5.38 albumin 3.4 normal LFTs normal lactic acid, leukocytosis of 14.  Hemoglobin of 11.6 MCV of 105.0 and platelet counts of 408.  Review of Systems: Review of Systems  Constitutional:  Positive for chills.  Gastrointestinal:  Positive for abdominal pain.    Past Medical History:  Diagnosis Date   Acute renal failure (HCC) 06/27/2021   Anxiety    Aortic atherosclerosis (HCC)    Cataract    Depression    Dysrhythmia    GERD (gastroesophageal reflux disease)    Gout    History of kidney stones     Hyperlipidemia    Hypertension    Nausea & vomiting 12/02/2021   Obesity    Obstruction of left ureteropelvic junction (UPJ) due to stone 06/2021   Osteoarthritis    Perforated duodenal ulcer (HCC) 06/27/2021   Pyonephrosis 06/2021   left   Septic shock (HCC) 06/27/2021   Type 2 diabetes mellitus with stage 5 chronic kidney disease (HCC)    Vertigo    Past Surgical History:  Procedure Laterality Date   CATARACT EXTRACTION, BILATERAL Bilateral 2019   COLONOSCOPY WITH PROPOFOL N/A 08/19/2015   Procedure: COLONOSCOPY WITH PROPOFOL;  Surgeon: Midge Minium, MD;  Location: [facility];  Service: Endoscopy;  Laterality: N/A;  diabetic - insulin   CYSTOSCOPY W/ RETROGRADES Bilateral 12/22/2019   Procedure: CYSTOSCOPY WITH RETROGRADE PYELOGRAM;  Surgeon: Riki Altes, MD;  Location: [facility];  Service: Urology;  Laterality: Bilateral;   CYSTOSCOPY W/ URETERAL STENT PLACEMENT Bilateral 07/05/2021   Procedure: CYSTOSCOPY WITH RETROGRADE PYELOGRAM/BILATERAL URETERAL STENT PLACEMENT;  Surgeon: Riki Altes, MD;  Location: [facility];  Service: Urology;  Laterality: Bilateral;   CYSTOSCOPY W/ URETERAL STENT PLACEMENT Bilateral 12/26/2021   Procedure: CYSTOSCOPY WITH STENT REMOVAL;  Surgeon: Riki Altes, MD;  Location: [facility];  Service: Urology;  Laterality: Bilateral;   CYSTOSCOPY W/ URETERAL STENT PLACEMENT Right 03/13/2022   Procedure: CYSTOSCOPY WITH RETROGRADE PYELOGRAM/URETERAL STENT PLACEMENT;  Surgeon: Riki Altes, MD;  Location: [facility];  Service: Urology;  Laterality: Right;   CYSTOSCOPY  W/ URETERAL STENT PLACEMENT Right 01/01/2023   Procedure: CYSTOSCOPY WITH STENT EXCHANGE;  Surgeon: Riki Altes, MD;  Location: [facility];  Service: Urology;  Laterality: Right;   CYSTOSCOPY W/ URETERAL STENT REMOVAL Right 05/08/2022   Procedure: CYSTOSCOPY WITH STENT REMOVAL;  Surgeon: Riki Altes, MD;  Location: [facility];  Service: Urology;  Laterality: Right;   CYSTOSCOPY WITH  BIOPSY N/A 12/22/2019   Procedure: CYSTOSCOPY WITH bladder BIOPSY;  Surgeon: Riki Altes, MD;  Location: [facility];  Service: Urology;  Laterality: N/A;   CYSTOSCOPY WITH BIOPSY  03/13/2022   Procedure: CYSTOSCOPY WITH BLADDER BIOPSY;  Surgeon: Riki Altes, MD;  Location: [facility];  Service: Urology;;   CYSTOSCOPY WITH STENT PLACEMENT Right 12/22/2019   Procedure: CYSTOSCOPY WITH STENT PLACEMENT;  Surgeon: Riki Altes, MD;  Location: [facility];  Service: Urology;  Laterality: Right;   CYSTOSCOPY WITH STENT PLACEMENT Right 07/03/2022   Procedure: CYSTOSCOPY/RIGHT URETEROSCOPY WITH STENT PLACEMENT;  Surgeon: Riki Altes, MD;  Location: [facility];  Service: Urology;  Laterality: Right;   CYSTOSCOPY WITH URETEROSCOPY Bilateral 12/22/2019   Procedure: CYSTOSCOPY WITH URETEROSCOPY;  Surgeon: Riki Altes, MD;  Location: [facility];  Service: Urology;  Laterality: Bilateral;   CYSTOSCOPY/URETEROSCOPY/HOLMIUM LASER/STENT PLACEMENT Right 12/22/2019   Procedure: CYSTOSCOPY/URETEROSCOPY/HOLMIUM LASER/STENT PLACEMENT;  Surgeon: Riki Altes, MD;  Location: [facility];  Service: Urology;  Laterality: Right;   CYSTOSCOPY/URETEROSCOPY/HOLMIUM LASER/STENT PLACEMENT Bilateral 12/08/2021   Procedure: CYSTOSCOPY/URETEROSCOPY/HOLMIUM LASER/STENT PLACEMENT;  Surgeon: Sondra Come, MD;  Location: [facility];  Service: Urology;  Laterality: Bilateral;   DIALYSIS/PERMA CATHETER INSERTION N/A 06/29/2021   Procedure: DIALYSIS/PERMA CATHETER INSERTION;  Surgeon: Annice Needy, MD;  Location: [facility];  Service: Cardiovascular;  Laterality: N/A;   DIALYSIS/PERMA CATHETER INSERTION N/A 07/13/2021   Procedure: DIALYSIS/PERMA CATHETER INSERTION;  Surgeon: Annice Needy, MD;  Location: [facility];  Service: Cardiovascular;  Laterality: N/A;   EYE SURGERY     IR GASTROSTOMY TUBE MOD SED  08/01/2021   LAPAROTOMY N/A 06/29/2021   Procedure: EXPLORATORY LAPAROTOMY WITH REPAIR OF DUODENAL PERFORATION;   Surgeon: Henrene Dodge, MD;  Location: [facility];  Service: General;  Laterality: N/A;   POLYPECTOMY  08/19/2015   Procedure: POLYPECTOMY;  Surgeon: Midge Minium, MD;  Location: [facility];  Service: Endoscopy;;   TUBAL LIGATION  1992   Social History:   reports that she quit smoking about 51 years ago. Her smoking use included cigarettes. She started smoking about 57 years ago. She has a 4.5 pack-year smoking history. She has never used smokeless tobacco. She reports that she does not drink alcohol and does not use drugs.  Allergies  Allergen Reactions   Contrast Media  [Iodinated Contrast Media] Anaphylaxis   Iodine Swelling    (IV only) - angioedema    Family History  Problem Relation Age of Onset   Diabetes Father    Cancer Father        lung cancer   Diabetes Brother    Stroke Mother     Prior to Admission medications   Medication Sig Start Date End Date Taking? Authorizing Provider  aspirin 81 MG EC tablet Take 81 mg by mouth daily.    [provider]  atorvastatin (LIPITOR) 20 MG tablet Take 20 mg by mouth daily.    [provider]  buPROPion (WELLBUTRIN XL) 150 MG 24 hr tablet Take 150 mg by mouth daily.    [provider]  busPIRone (BUSPAR)  5 MG tablet Take 5 mg by mouth daily.    [provider]  cephALEXin (KEFLEX) 500 MG capsule Take 1 capsule (500 mg total) by mouth 2 (two) times daily. 07/26/23   Minna Antis, MD  cloNIDine (CATAPRES) 0.1 MG tablet Take 1 tablet (0.1 mg total) by mouth 2 (two) times daily. 12/11/21   Enedina Finner, MD  conjugated estrogens (PREMARIN) vaginal cream Apply one pea-sized amount around the opening of the urethra daily for 2 weeks, then 3 times weekly moving forward. 06/07/22   Vaillancourt, Lelon Mast, PA-C  Dulaglutide (TRULICITY) 1.5 MG/0.5ML SOPN Inject 1.5 mg into the skin once a week. Sunday    [provider]  ferrous sulfate 325 (65 FE) MG tablet Take 325 mg by mouth daily with  breakfast.    [provider]  insulin aspart (NOVOLOG) 100 UNIT/ML injection Inject 10 Units into the skin 3 (three) times daily before meals.    [provider]  insulin glargine (LANTUS) 100 UNIT/ML injection Inject 70 Units into the skin daily.    [provider]  lansoprazole (PREVACID) 30 MG capsule Take 30 mg by mouth at bedtime.    [provider]  loperamide (IMODIUM A-D) 2 MG tablet Take 2 mg by mouth every 6 (six) hours as needed for diarrhea or loose stools.    [provider]  metoprolol tartrate (LOPRESSOR) 100 MG tablet Take 1 tablet (100 mg total) by mouth 2 (two) times daily. 12/11/21   Enedina Finner, MD  Multiple Vitamin (MULTIVITAMIN WITH MINERALS) TABS tablet Take 1 tablet by mouth daily. 12/12/21   Enedina Finner, MD  polyethylene glycol (MIRALAX / GLYCOLAX) 17 g packet Take 17 g by mouth daily as needed for mild constipation or moderate constipation.    [provider]  senna-docusate (SENOKOT-S) 8.6-50 MG tablet Take 1 tablet by mouth at bedtime as needed for mild constipation or moderate constipation.    [provider]  trospium (SANCTURA) 20 MG tablet Take 1 tablet (20 mg total) by mouth 2 (two) times daily. 04/18/23 04/12/24  Michiel Cowboy A, PA-C     Vitals:   07/30/23 2255 07/30/23 2352 07/31/23 0100 07/31/23 0130  BP: (!) 183/68  (!) 155/59 (!) 145/60  Pulse: 80  74 73  Resp: (!) 26  (!) 26 (!) 22  Temp:  100.1 F (37.8 C)    TempSrc:  Oral    SpO2: 96%   96%  Weight:      Height:       Physical Exam Vitals and nursing note reviewed.  Constitutional:      General: She is not in acute distress.    Appearance: She is obese. She is ill-appearing.  HENT:     Head: Normocephalic and atraumatic.     Right Ear: Hearing and external ear normal.     Left Ear: Hearing and external ear normal.     Nose: Nose normal. No nasal deformity.     Mouth/Throat:     Lips: Pink.     Tongue: No lesions.      Pharynx: Oropharynx is clear.  Eyes:     General: Lids are normal.     Extraocular Movements: Extraocular movements intact.     Pupils: Pupils are equal, round, and reactive to light.  Cardiovascular:     Rate and Rhythm: Normal rate and regular rhythm.     Pulses: Normal pulses.     Heart sounds: Normal heart sounds.  Pulmonary:  Effort: Pulmonary effort is normal.     Breath sounds: Normal breath sounds.  Abdominal:     General: Bowel sounds are normal. There is distension. There is no abdominal bruit.     Palpations: Abdomen is soft. There is no mass.     Tenderness: There is generalized abdominal tenderness. There is guarding.     Hernia: No hernia is present.     Comments: Abd is distended / firm / pt is tender/ guarding.   Musculoskeletal:     Right lower leg: No edema.     Left lower leg: No edema.  Skin:    General: Skin is warm.  Neurological:     General: No focal deficit present.     Mental Status: She is alert and oriented to person, place, and time.     Cranial Nerves: Cranial nerves 2-12 are intact.  Psychiatric:        Attention and Perception: Attention normal.        Mood and Affect: Mood normal.        Speech: Speech normal.        Behavior: Behavior normal. Behavior is cooperative.   Labs on Admission: I have personally reviewed following labs and imaging studies  CBC: Recent Labs  Lab 07/26/23 1233 07/30/23 1731  WBC 5.6 14.0*  NEUTROABS  --  11.5*  HGB 8.9* 11.6*  HCT 28.2* 37.7  MCV 102.5* 105.0*  PLT 266 408*   Basic Metabolic Panel: Recent Labs  Lab 07/26/23 1233 07/30/23 1731  NA 132* 133*  K 4.3 4.8  CL 99 99  CO2 21* 20*  GLUCOSE 201* 232*  BUN 61* 63*  CREATININE 5.79* 5.38*  CALCIUM 8.4* 9.2   GFR: Estimated Creatinine Clearance: 10 mL/min (A) (by C-G formula based on SCr of 5.38 mg/dL (H)). Liver Function Tests: Recent Labs  Lab 07/26/23 1233 07/30/23 1731  AST 15 16  ALT 18 26  ALKPHOS 94 103  BILITOT 0.5 0.6   PROT 6.6 7.8  ALBUMIN 2.9* 3.4*   Recent Labs  Lab 07/26/23 1233  LIPASE 40   Urinalysis    Component Value Date/Time   COLORURINE YELLOW (A) 07/30/2023 1943   APPEARANCEUR TURBID (A) 07/30/2023 1943   APPEARANCEUR Cloudy (A) 12/20/2022 0954   LABSPEC 1.011 07/30/2023 1943   LABSPEC 1.024 01/27/2014 1350   PHURINE 6.0 07/30/2023 1943   GLUCOSEU 50 (A) 07/30/2023 1943   GLUCOSEU >=500 01/27/2014 1350   HGBUR NEGATIVE 07/30/2023 1943   BILIRUBINUR NEGATIVE 07/30/2023 1943   BILIRUBINUR Negative 12/20/2022 0954   BILIRUBINUR Negative 01/27/2014 1350   KETONESUR NEGATIVE 07/30/2023 1943   PROTEINUR 100 (A) 07/30/2023 1943   UROBILINOGEN 0.2 02/10/2022 0907   NITRITE NEGATIVE 07/30/2023 1943   LEUKOCYTESUR LARGE (A) 07/30/2023 1943   LEUKOCYTESUR 2+ 01/27/2014 1350   Unresulted Labs (From admission, onward)     Start     Ordered   07/31/23 0500  Comprehensive metabolic panel  Tomorrow morning,   R        07/30/23 2203   07/31/23 0500  CBC  Tomorrow morning,   R        07/30/23 2203   07/30/23 1943  Urine Culture  Once,   R        07/30/23 1943   07/30/23 1910  Culture, blood (routine x 2)  BLOOD CULTURE X 2,   STAT      07/30/23 1909  Medications  alteplase (CATHFLO ACTIVASE) injection 2 mg (10 mg Intracatheter Given 07/31/23 0238)  fluconazole (DIFLUCAN) tablet 100 mg (100 mg Oral Given 07/30/23 2253)  cloNIDine (CATAPRES) tablet 0.1 mg (0.1 mg Oral Given 07/30/23 2253)  aspirin EC tablet 81 mg (has no administration in time range)  atorvastatin (LIPITOR) tablet 20 mg (has no administration in time range)  buPROPion (WELLBUTRIN XL) 24 hr tablet 150 mg (has no administration in time range)  metoprolol tartrate (LOPRESSOR) tablet 100 mg (100 mg Oral Given 07/30/23 2253)  heparin injection 5,000 Units (5,000 Units Subcutaneous Given 07/30/23 2255)  sodium chloride flush (NS) 0.9 % injection 3 mL (3 mLs Intravenous Given 07/30/23 2254)  acetaminophen (TYLENOL) tablet  650 mg (650 mg Oral Given 07/30/23 2253)    Or  acetaminophen (TYLENOL) suppository 650 mg ( Rectal See Alternative 07/30/23 2253)  morphine (PF) 2 MG/ML injection 2 mg (2 mg Intravenous Given 07/30/23 2252)  sodium chloride flush (NS) 0.9 % injection 3 mL (3 mLs Intravenous Given 07/30/23 2254)  sodium chloride flush (NS) 0.9 % injection 3 mL (has no administration in time range)  0.9 %  sodium chloride infusion (has no administration in time range)  ceFEPIme (MAXIPIME) 1 g in sodium chloride 0.9 % 100 mL IVPB (0 g Intravenous Stopped 07/30/23 2007)  vancomycin (VANCOCIN) IVPB 1000 mg/200 mL premix (0 mg Intravenous Stopped 07/30/23 2115)  morphine (PF) 4 MG/ML injection 4 mg (4 mg Intravenous Given 07/30/23 1937)   Radiological Exams on Admission: CT ABDOMEN PELVIS WO CONTRAST  Result Date: 07/30/2023 CLINICAL DATA:  Acute nonlocalized abdominal pain EXAM: CT ABDOMEN AND PELVIS WITHOUT CONTRAST TECHNIQUE: Multidetector CT imaging of the abdomen and pelvis was performed following the standard protocol without IV contrast. RADIATION DOSE REDUCTION: This exam was performed according to the departmental dose-optimization program which includes automated exposure control, adjustment of the mA and/or kV according to patient size and/or use of iterative reconstruction technique. COMPARISON:  CT abdomen and pelvis 07/26/2023 FINDINGS: Lower chest: Bibasilar atelectasis/scarring. Hepatobiliary: Unremarkable liver. Normal gallbladder. No biliary dilation. Pancreas: Unremarkable. Spleen: Unremarkable. Adrenals/Urinary Tract: Normal adrenal glands. Similar mild-to-moderate right hydroureteronephrosis about the right ureteral stent. Periureteral fat stranding is similar. Unchanged mild ectasia of the left renal collecting system and ureter. Nonobstructing stone or parenchymal calcification lower pole of the right kidney. No ureteral stones. Unremarkable bladder. Stomach/Bowel: Normal caliber large and small bowel. Moderate to  large stool burden in the right colon. No bowel wall thickening. The appendix measures 7 mm diameter. There is mild fluid and stranding about the appendix however this is favored due to mesenteric edema/peritoneum dialysis.Stomach is within normal limits. Vascular/Lymphatic: Aortic atherosclerosis. No enlarged abdominal or pelvic lymph nodes. Reproductive: Unremarkable. Other: Peritoneal dialysis catheter. Mild mesenteric edema. Small volume free fluid about the liver, in the pelvis and pericolic gutters. Small locule of free air in the low central abdomen adjacent to the dialysis catheter. Similar skin thickening and mild stranding in the anterior abdominal wall. Musculoskeletal: No acute fracture. Advanced arthritis in the right-greater-than-left hips. IMPRESSION: 1. Similar mild-to-moderate right hydroureteronephrosis about the right ureteral stent with periureteral fat stranding. 2. Peritoneal dialysis catheter with small volume abdominopelvic ascites. 3. There is mild stranding and free fluid about the appendix which is normal in caliber. This is favored due to mesenteric edema and peritoneal dialysis. 4. Moderate to large stool burden in the right colon. Correlate for constipation. 5. Mild skin thickening and stranding in the anterior abdominal wall. Correlate for cellulitis. Aortic Atherosclerosis (ICD10-I70.0).  Electronically Signed   By: Minerva Fester M.D.   On: 07/30/2023 22:35    Data Reviewed: Relevant notes from primary care and specialist visits, past discharge summaries as available in EHR, including Care Everywhere. Prior diagnostic testing as pertinent to current admission diagnoses Updated medications and problem lists for reconciliation ED course, including vitals, labs, imaging, treatment and response to treatment Triage notes, nursing and pharmacy notes and ED provider's notes Notable results as noted in HPI  Assessment and Plan: * Abdominal pain PRN morphine.  Suspect 2/2 PD  catheter dysfunction and replacement.  Ct ABD negative for perforation or colitis but does show ascites.   SBP (spontaneous bacterial peritonitis) (HCC) Cont cefepime and vancomcyin. Nephrology consulted and PD nurse to evaluate pt tonight.  CTA abd shows no acute abd finding: 1. Similar mild-to-moderate right hydroureteronephrosis about the right ureteral stent with periureteral fat stranding. 2. Peritoneal dialysis catheter with small volume abdominopelvic ascites. 3. There is mild stranding and free fluid about the appendix which is normal in caliber. This is favored due to mesenteric edema and peritoneal dialysis. 4. Moderate to large stool burden in the right colon. Correlate for constipation. 5. Mild skin thickening and stranding in the anterior abdominal wall. Correlate for cellulitis.  Sacral decubitus ulcer, stage IV (HCC) Stage stage I bilateral erythema on both the buttocks.  No wound documented and reported by patient's nurse.  Was not able to turn the patient to visualize and image the wound that she was in severe pain from her abdominal distention.  Wound care skin care.  Barrier protection.  Essential hypertension Continue patient on metoprolol and clonidine.  Benign hypertension with CKD (chronic kidney disease) stage III (HCC) Vitals:   07/30/23 1719 07/30/23 2000  BP: (!) 152/61 (!) 170/75  We will continue patient on clonidine and metoprolol. Avoid contrast and renally dose needed medications. Gentle IV fluid hydration overnight.  T2DM (type 2 diabetes mellitus) (HCC) Clear liquid sips.NPO except water and ice.  Glycemic protocol.  A1c was a year ago at 5.9.  We will hold home regimen of Lantus and NovoLog.    DVT prophylaxis:  Heparin  Consults:  Nephrology   Advance Care Planning:    Code Status: Full Code   Family Communication:  None   Disposition Plan:  Home   Severity of Illness: The appropriate patient status for this patient is  INPATIENT. Inpatient status is judged to be reasonable and necessary in order to provide the required intensity of service to ensure the patient's safety. The patient's presenting symptoms, physical exam findings, and initial radiographic and laboratory data in the context of their chronic comorbidities is felt to place them at high risk for further clinical deterioration. Furthermore, it is not anticipated that the patient will be medically stable for discharge from the hospital within 2 midnights of admission.   * I certify that at the point of admission it is my clinical judgment that the patient will require inpatient hospital care spanning beyond 2 midnights from the point of admission due to high intensity of service, high risk for further deterioration and high frequency of surveillance required.*  Author: Gertha Calkin, MD 07/31/2023 2:55 AM  For on call review [email or web address].

## 2023-07-30 NOTE — Assessment & Plan Note (Signed)
Continue patient on metoprolol and clonidine.

## 2023-07-30 NOTE — Assessment & Plan Note (Addendum)
Patient is now ESRD. Blood pressure mildly elevated.   -Continue home clonidine and metoprolol

## 2023-07-30 NOTE — Assessment & Plan Note (Addendum)
Patient met sepsis criteria with fever and leukocytosis likely secondary to SBP. Concern of SBP according to the initial cell count of peritoneal fluid at nephrology office, pending culture results-unable to see any of them on epic.  Nephrology would like to continue broad-spectrum antibiotics at this time. Concern of retention of fluid-repeat ultrasound with no significant amount to be drained by IR. -Continue with broad-spectrum antibiotics -Supportive care -Follow-up cultures

## 2023-07-30 NOTE — ED Notes (Signed)
Blue top and 1 set cultures sent.

## 2023-07-30 NOTE — Assessment & Plan Note (Addendum)
CBG elevated.  Patient was on Trulicity, 60 units of Lantus and NovoLog at home. -Semglee 15 units twice daily -SSI plus mealtime coverage

## 2023-07-30 NOTE — Assessment & Plan Note (Addendum)
PRN morphine.  Suspect 2/2 PD catheter dysfunction and SBP Ct ABD negative for perforation or colitis.

## 2023-07-30 NOTE — Progress Notes (Signed)
This patient presents with abdominal pain from peritonitis. 2 L fluid was instilled this morning but was not able to be drained fully. About 30-50 cc of cloudy fluid was obtained in the outpatient setting and sent for analysis.  Plan  tPA dwell for 2 hours to see if catheter can be declotted and abdomen decompressed.  Agree with broad spectrum antibiotics as ordered. Will add fungal prophylaxis with fluconazole.

## 2023-07-30 NOTE — ED Triage Notes (Signed)
Pt here with husband POV from dialysis ("training", they are starting PD but not getting full PD sessions yet. 3L put in and only out, catheter is clogged with fibrin clots per [facility] letter sent with pt. Also they believe something was abnormal with pt labs seen on mychart and letter sent from [facility] mentions that pt should be evaluated for peritonitis. Skin around PD access not red or swollen. Belly distended.  Ongoing since almost 1 week when 1 week into PD training sessions.  Temp 99.8.

## 2023-07-31 ENCOUNTER — Inpatient Hospital Stay: Payer: Medicare Other

## 2023-07-31 DIAGNOSIS — R1084 Generalized abdominal pain: Secondary | ICD-10-CM | POA: Diagnosis not present

## 2023-07-31 DIAGNOSIS — I10 Essential (primary) hypertension: Secondary | ICD-10-CM

## 2023-07-31 DIAGNOSIS — K652 Spontaneous bacterial peritonitis: Secondary | ICD-10-CM | POA: Diagnosis not present

## 2023-07-31 DIAGNOSIS — N186 End stage renal disease: Secondary | ICD-10-CM | POA: Diagnosis not present

## 2023-07-31 DIAGNOSIS — E875 Hyperkalemia: Secondary | ICD-10-CM

## 2023-07-31 DIAGNOSIS — Z992 Dependence on renal dialysis: Secondary | ICD-10-CM

## 2023-07-31 DIAGNOSIS — E669 Obesity, unspecified: Secondary | ICD-10-CM

## 2023-07-31 LAB — GLUCOSE, CAPILLARY
Glucose-Capillary: 226 mg/dL — ABNORMAL HIGH (ref 70–99)
Glucose-Capillary: 150 mg/dL — ABNORMAL HIGH (ref 70–99)

## 2023-07-31 LAB — COMPREHENSIVE METABOLIC PANEL
ALT: 20 U/L (ref 0–44)
AST: 14 U/L — ABNORMAL LOW (ref 15–41)
Albumin: 2.8 g/dL — ABNORMAL LOW (ref 3.5–5.0)
Alkaline Phosphatase: 94 U/L (ref 38–126)
Anion gap: 10 (ref 5–15)
BUN: 67 mg/dL — ABNORMAL HIGH (ref 8–23)
CO2: 21 mmol/L — ABNORMAL LOW (ref 22–32)
Calcium: 8.7 mg/dL — ABNORMAL LOW (ref 8.9–10.3)
Chloride: 102 mmol/L (ref 98–111)
Creatinine, Ser: 5.53 mg/dL — ABNORMAL HIGH (ref 0.44–1.00)
GFR, Estimated: 7 mL/min — ABNORMAL LOW (ref >60)
Glucose, Bld: 212 mg/dL — ABNORMAL HIGH (ref 70–99)
Potassium: 5.3 mmol/L — ABNORMAL HIGH (ref 3.5–5.1)
Sodium: 133 mmol/L — ABNORMAL LOW (ref 135–145)
Total Bilirubin: 0.7 mg/dL (ref 0.3–1.2)
Total Protein: 7 g/dL (ref 6.5–8.1)

## 2023-07-31 LAB — CBC
HCT: 31.6 % — ABNORMAL LOW (ref 36.0–46.0)
Hemoglobin: 10.2 g/dL — ABNORMAL LOW (ref 12.0–15.0)
MCH: 32.4 pg (ref 26.0–34.0)
MCHC: 32.3 g/dL (ref 30.0–36.0)
MCV: 100.3 fL — ABNORMAL HIGH (ref 80.0–100.0)
Platelets: 336 10*3/uL (ref 150–400)
RBC: 3.15 MIL/uL — ABNORMAL LOW (ref 3.87–5.11)
RDW: 15 % (ref 11.5–15.5)
WBC: 12.4 10*3/uL — ABNORMAL HIGH (ref 4.0–10.5)
nRBC: 0 % (ref 0.0–0.2)

## 2023-07-31 LAB — HEMOGLOBIN A1C
Hgb A1c MFr Bld: 7.2 % — ABNORMAL HIGH (ref 4.8–5.6)
Mean Plasma Glucose: 159.94 mg/dL

## 2023-07-31 LAB — CBG MONITORING, ED
Glucose-Capillary: 243 mg/dL — ABNORMAL HIGH (ref 70–99)
Glucose-Capillary: 246 mg/dL — ABNORMAL HIGH (ref 70–99)

## 2023-07-31 MED ORDER — SODIUM BICARBONATE 650 MG PO TABS
650.0000 mg | ORAL_TABLET | Freq: Two times a day (BID) | ORAL | Status: DC
  Administered 2023-07-31 – 2023-08-04 (×10): 650 mg via ORAL
  Filled 2023-07-31 (×11): qty 1

## 2023-07-31 MED ORDER — PATIROMER SORBITEX CALCIUM 8.4 G PO PACK
8.4000 g | PACK | Freq: Every day | ORAL | Status: DC
  Administered 2023-07-31 – 2023-08-05 (×6): 8.4 g via ORAL
  Filled 2023-07-31 (×6): qty 1

## 2023-07-31 MED ORDER — GENTAMICIN SULFATE 0.1 % EX CREA
1.0000 | TOPICAL_CREAM | Freq: Every day | CUTANEOUS | Status: DC
  Administered 2023-07-31 – 2023-08-01 (×2): 1 via TOPICAL
  Filled 2023-07-31 (×2): qty 15

## 2023-07-31 MED ORDER — INSULIN ASPART 100 UNIT/ML IJ SOLN
0.0000 [IU] | Freq: Three times a day (TID) | INTRAMUSCULAR | Status: DC
  Administered 2023-07-31 (×2): 3 [IU] via SUBCUTANEOUS
  Administered 2023-08-01: 5 [IU] via SUBCUTANEOUS
  Administered 2023-08-02: 3 [IU] via SUBCUTANEOUS
  Administered 2023-08-03: 5 [IU] via SUBCUTANEOUS
  Administered 2023-08-03: 3 [IU] via SUBCUTANEOUS
  Administered 2023-08-04 (×2): 5 [IU] via SUBCUTANEOUS
  Administered 2023-08-04: 9 [IU] via SUBCUTANEOUS
  Administered 2023-08-05: 5 [IU] via SUBCUTANEOUS
  Filled 2023-07-31 (×13): qty 1

## 2023-07-31 MED ORDER — INSULIN ASPART 100 UNIT/ML IJ SOLN
0.0000 [IU] | Freq: Every day | INTRAMUSCULAR | Status: DC
  Administered 2023-08-01: 3 [IU] via SUBCUTANEOUS
  Administered 2023-08-03 – 2023-08-04 (×2): 5 [IU] via SUBCUTANEOUS
  Filled 2023-07-31 (×3): qty 1

## 2023-07-31 MED ORDER — HEPARIN SODIUM (PORCINE) 1000 UNIT/ML IJ SOLN
INTRAPERITONEAL | Status: DC | PRN
  Filled 2023-07-31 (×3): qty 2000

## 2023-07-31 MED ORDER — INSULIN ASPART 100 UNIT/ML IJ SOLN
3.0000 [IU] | Freq: Three times a day (TID) | INTRAMUSCULAR | Status: DC
  Administered 2023-07-31 – 2023-08-03 (×6): 3 [IU] via SUBCUTANEOUS
  Filled 2023-07-31 (×9): qty 1

## 2023-07-31 MED ORDER — DELFLEX-LC/2.5% DEXTROSE 394 MOSM/L IP SOLN
INTRAPERITONEAL | Status: DC
  Filled 2023-07-31 (×3): qty 3000

## 2023-07-31 MED ORDER — VANCOMYCIN HCL 1500 MG/300ML IV SOLN
1500.0000 mg | Freq: Once | INTRAVENOUS | Status: AC
  Administered 2023-07-31: 1500 mg via INTRAVENOUS
  Filled 2023-07-31: qty 300

## 2023-07-31 MED ORDER — DELFLEX-LC/1.5% DEXTROSE 344 MOSM/L IP SOLN
INTRAPERITONEAL | Status: DC
  Filled 2023-07-31 (×3): qty 3000

## 2023-07-31 MED ORDER — BUSPIRONE HCL 10 MG PO TABS
5.0000 mg | ORAL_TABLET | Freq: Every day | ORAL | Status: DC
  Administered 2023-07-31 – 2023-08-05 (×6): 5 mg via ORAL
  Filled 2023-07-31 (×7): qty 1

## 2023-07-31 MED ORDER — SODIUM CHLORIDE 0.9 % IV SOLN
2.0000 g | INTRAVENOUS | Status: DC
  Administered 2023-07-31 – 2023-08-01 (×2): 2 g via INTRAVENOUS
  Filled 2023-07-31 (×3): qty 20

## 2023-07-31 MED ORDER — INSULIN GLARGINE-YFGN 100 UNIT/ML ~~LOC~~ SOLN
15.0000 [IU] | Freq: Two times a day (BID) | SUBCUTANEOUS | Status: DC
  Administered 2023-07-31 – 2023-08-03 (×8): 15 [IU] via SUBCUTANEOUS
  Filled 2023-07-31 (×9): qty 0.15

## 2023-07-31 NOTE — Progress Notes (Signed)
Confirmed need for additional medication volume with Dr. Thedore Mins. Using sterile technique, 10mg /1mL Alteplase instilled to peritoneal catheter per lumen volume  @ 0230 am for two hour dwell time as per physician order. PM Nephrology nurse, Larene Pickett, notified for follow up and removal of dwell. Report given to [facility] ED RN. Patient asleep, awakens to voice, no c/o voiced, no acute distress noted,; patient condition stable. VSS 99.6 - 138/69 (89) - 72 - 23 - 98%

## 2023-07-31 NOTE — Progress Notes (Addendum)
[facility]  ROUNDING NOTE   Subjective:   [name] was admitted to [facility] on 07/30/2023 for Abdominal pain [R10.9]  Patient was sent to ED from [facility] due to a malfunctioning PD catheter. It was reported that patient was undergoing PD training and was filled with 3L of fluid. Only returned with drain. Patient also complains about abdominal distention, pain and constipation. Patient was recently seen in ED for abdominal pain and fluid was instilled then for sample. Patient discharged before sample was collected.   Labs significant for sodium 133, bicarb 20, glucose 232, BUN 63, creatinine 5.38 and GFR 8, WBC 14.0 and HGB 11.6. UA appears turbid with leukocytes. CT abd/pelvis shows mild to moderate hydroureteronephrosis at Lopez right urethral stent and moderate to large stool burden.   We have been consulted to evaluate dialysis needs.   Objective:  Vital signs in last 24 hours:  Temp:  [99.3 F (37.4 C)-101.9 F (38.8 C)] 99.7 F (37.6 C) (09/04 0626) Pulse Rate:  [63-82] 70 (09/04 1615) Resp:  [17-27] 19 (09/04 1615) BP: (142-192)/(59-115) 142/115 (09/04 1600) SpO2:  [88 %-97 %] 96 % (09/04 1615) Weight:  [99.8 kg] 99.8 kg (09/03 1726)  Weight change:  Filed Weights   07/30/23 1726  Weight: 99.8 kg    Intake/Output: I/O last 3 completed shifts: In: 281.3 [IV Piggyback:281.3] Out: -    Intake/Output this shift:  No intake/output data recorded.  Physical Exam: General: NAD, restless  Head: Normocephalic, atraumatic. Dry oral mucosal membranes  Eyes: Anicteric  Lungs:  Clear to auscultation, [place] O2  Heart: Regular rate and rhythm  Abdomen:  Soft, tenderness, distention  Extremities:  no peripheral edema.   Neurologic: Alert, moving all four extremities  Skin: No lesions  Access: Peritoneal dialysis catheter    Basic Metabolic Panel: Recent Labs  Lab 07/26/23 1233 07/30/23 1731 07/31/23 0538  NA 132* 133* 133*  K 4.3 4.8 5.3*  CL  99 99 102  CO2 21* 20* 21*  GLUCOSE 201* 232* 212*  BUN 61* 63* 67*  CREATININE 5.79* 5.38* 5.53*  CALCIUM 8.4* 9.2 8.7*    Liver Function Tests: Recent Labs  Lab 07/26/23 1233 07/30/23 1731 07/31/23 0538  AST 15 16 14*  ALT 18 26 20   ALKPHOS 94 103 94  BILITOT 0.5 0.6 0.7  PROT 6.6 7.8 7.0  ALBUMIN 2.9* 3.4* 2.8*   Recent Labs  Lab 07/26/23 1233  LIPASE 40   No results for input(s): "AMMONIA" in Lopez last 168 hours.  CBC: Recent Labs  Lab 07/26/23 1233 07/30/23 1731 07/31/23 0538  WBC 5.6 14.0* 12.4*  NEUTROABS  --  11.5*  --   HGB 8.9* 11.6* 10.2*  HCT 28.2* 37.7 31.6*  MCV 102.5* 105.0* 100.3*  PLT 266 408* 336    Cardiac Enzymes: No results for input(s): "CKTOTAL", "CKMB", "CKMBINDEX", "TROPONINI" in Lopez last 168 hours.  BNP: Invalid input(s): "POCBNP"  CBG: Recent Labs  Lab 07/31/23 1013 07/31/23 1357  GLUCAP 243* 246*    Microbiology: Results for orders placed or performed during Lopez hospital encounter of 07/30/23  Culture, blood (routine x 2)     Status: None (Preliminary result)   Collection Time: 07/30/23  5:31 PM   Specimen: BLOOD  Result Value Ref Range Status   Specimen Description BLOOD RIGHT ANTECUBITAL  Final   Special Requests   Final    BOTTLES DRAWN AEROBIC AND ANAEROBIC Blood Culture adequate volume   Culture   Final  NO GROWTH < 24 HOURS Performed at [facility], [address] [place], [place] [address]    Report Status PENDING  Incomplete  Culture, blood (routine x 2)     Status: None (Preliminary result)   Collection Time: 07/30/23  7:00 PM   Specimen: BLOOD  Result Value Ref Range Status   Specimen Description BLOOD BLOOD LEFT FOREARM  Final   Special Requests   Final    BOTTLES DRAWN AEROBIC AND ANAEROBIC Blood Culture results may not be optimal due to an inadequate volume of blood received in culture bottles   Culture   Final    NO GROWTH < 24 HOURS Performed at [facility], [address], [place], [place] [address]    Report Status PENDING  Incomplete    Coagulation Studies: No results for input(s): "LABPROT", "INR" in Lopez last 72 hours.  Urinalysis: Recent Labs    07/30/23 1943  COLORURINE YELLOW*  LABSPEC 1.011  PHURINE 6.0  GLUCOSEU 50*  HGBUR NEGATIVE  BILIRUBINUR NEGATIVE  KETONESUR NEGATIVE  PROTEINUR 100*  NITRITE NEGATIVE  LEUKOCYTESUR LARGE*      Imaging: [place] ASCITES (ABDOMEN LIMITED)  Result Date: 07/31/2023 CLINICAL DATA:  [id number] Ascites [id number] EXAM: LIMITED ABDOMEN ULTRASOUND FOR ASCITES TECHNIQUE: Limited ultrasound survey for ascites was performed in all four abdominal quadrants. COMPARISON:  None Available. FINDINGS: Limited sonographic exam of Lopez abdominal 4 quadrants was performed for fluid assessment. Images demonstrate trace free fluid, not amenable to safe image guided drainage. IMPRESSION: Trace free fluid noted in Lopez abdomen. Fluid volume is insufficient for safe image guided drainage. Electronically Signed   By: Olive Bass M.D.   On: 07/31/2023 12:40   CT ABDOMEN PELVIS WO CONTRAST  Result Date: 07/30/2023 CLINICAL DATA:  Acute nonlocalized abdominal pain EXAM: CT ABDOMEN AND PELVIS WITHOUT CONTRAST TECHNIQUE: Multidetector CT imaging of Lopez abdomen and pelvis was performed following Lopez standard protocol without IV contrast. RADIATION DOSE REDUCTION: This exam was performed according to Lopez departmental dose-optimization program which includes automated exposure control, adjustment of the mA and/or kV according to patient size and/or use of iterative reconstruction technique. COMPARISON:  CT abdomen and pelvis 07/26/2023 FINDINGS: Lower chest: Bibasilar atelectasis/scarring. Hepatobiliary: Unremarkable liver. Normal gallbladder. No biliary dilation. Pancreas: Unremarkable. Spleen: Unremarkable. Adrenals/Urinary Tract: Normal adrenal glands. Similar mild-to-moderate right hydroureteronephrosis about Lopez right ureteral stent. Periureteral  fat stranding is similar. Unchanged mild ectasia of Lopez left renal collecting system and ureter. Nonobstructing stone or parenchymal calcification lower pole of Lopez right kidney. No ureteral stones. Unremarkable bladder. Stomach/Bowel: Normal caliber large and small bowel. Moderate to large stool burden in Lopez right colon. No bowel wall thickening. Lopez appendix measures 7 mm diameter. There is mild fluid and stranding about Lopez appendix however this is favored due to mesenteric edema/peritoneum dialysis.Stomach is within normal limits. Vascular/Lymphatic: Aortic atherosclerosis. No enlarged abdominal or pelvic lymph nodes. Reproductive: Unremarkable. Other: Peritoneal dialysis catheter. Mild mesenteric edema. Small volume free fluid about Lopez liver, in Lopez pelvis and pericolic gutters. Small locule of free air in Lopez low central abdomen adjacent to Lopez dialysis catheter. Similar skin thickening and mild stranding in Lopez anterior abdominal wall. Musculoskeletal: No acute fracture. Advanced arthritis in Lopez right-greater-than-left hips. IMPRESSION: 1. Similar mild-to-moderate right hydroureteronephrosis about Lopez right ureteral stent with periureteral fat stranding. 2. Peritoneal dialysis catheter with small volume abdominopelvic ascites. 3. There is mild stranding and free fluid about Lopez appendix which is normal in caliber. This is favored due to mesenteric  edema and peritoneal dialysis. 4. Moderate to large stool burden in Lopez right colon. Correlate for constipation. 5. Mild skin thickening and stranding in Lopez anterior abdominal wall. Correlate for cellulitis. Aortic Atherosclerosis (ICD10-I70.0). Electronically Signed   By: Minerva Fester M.D.   On: 07/30/2023 22:35     Medications:    sodium chloride     cefTRIAXone (ROCEPHIN)  IV     dialysis solution 1.5% low-MG/low-CA     dialysis solution 2.5% low-MG/low-CA      aspirin EC  81 mg Oral Daily   atorvastatin  20 mg Oral Daily   buPROPion  150 mg  Oral Daily   busPIRone  5 mg Oral Daily   cloNIDine  0.1 mg Oral BID   fluconazole  100 mg Oral QHS   gentamicin cream  1 Application Topical Daily   heparin  5,000 Units Subcutaneous Q12H   insulin aspart  0-5 Units Subcutaneous QHS   insulin aspart  0-9 Units Subcutaneous TID WC   insulin aspart  3 Units Subcutaneous TID WC   insulin glargine-yfgn  15 Units Subcutaneous BID   metoprolol tartrate  100 mg Oral BID   patiromer  8.4 g Oral Daily   sodium bicarbonate  650 mg Oral BID   sodium chloride flush  3 mL Intravenous Q12H   sodium chloride flush  3 mL Intravenous Q12H   sodium chloride, acetaminophen **OR** acetaminophen, heparin sodium (porcine) 1,000 Units in dialysis solution 2.5% low-MG/low-CA 2,000 mL dialysis solution, morphine injection, sodium chloride flush  Assessment/ Plan:  [name] is a 77 y.o.  female with end stage renal disease on peritoneal dialysis, hypertension, GERD, gout, hyeprlipidemia, diabetes mellitus type II and vertigo who presents to [facility] on 07/30/2023 for Abdominal pain [R10.9]  End Stage Renal Disease: just started peritoneal dialysis training.  - Reviewing of outpatient notes, it appears dialysate can flow into catheter but not return. Fluid appeared cloudy in clinic on return. Will attempt to dwell of heparinized dialysate and access catheter function. If not functioning, will consult vascular for permcath for back up HD.   Abdominal pain: multifactorial: peritonitis vs constipation Will obtain sample from dwell to evaluate peritonitis. Primary team has order bowel regimen to include enema's. May also consider lactulose.   Hypertension with chronic kidney disease: blood pressure currently at goal.  - Current regimen of clonidine and metoprolol.   Diabetes mellitus type II with chronic kidney disease: insulin dependent. Novolog and Lantus outpatient .   Anemia with chronic kidney disease: macrocytic. Was getting ESA and IV iron as  outpatient.  -Will monitor hemoglobin during this admission and restart ESA as indicated.   6.  CT of Lopez abdomen pelvis without contrast shows mild to moderate right hydroureteronephrosis with right ureteral stent, mesenteric edema.  Urologist has evaluated Lopez scan and Lopez condition is deemed chronic but stable.  Stent was placed in February.  Due for change as per urology protocol.   LOS: 1 Wendee Beavers 9/4/20244:25 PM  Patient was seen and examined with Wendee Beavers, NP.  I personally formulated plan of care for Lopez problems addressed and discussed with NP.  I agree with Lopez note as documented except as noted below. I take Lopez responsibility for Lopez inherent risk of patient management.

## 2023-07-31 NOTE — Assessment & Plan Note (Addendum)
On peritoneal dialysis. Nephrology is on board Concern of catheter malfunctioning

## 2023-07-31 NOTE — Assessment & Plan Note (Signed)
Secondary to renal disease.  Potassium at 5.3 -Start her on [employer]

## 2023-07-31 NOTE — ED Notes (Signed)
Receive Patient lying supine complaining of pain to abdomen in all quadrants especially to touch and movement. Pt has elevated temperature ranges from 100 to 101. Pt is coherent.  Attemps to aspirate after activase unsuccessful. 10 ml saline x1 flush well but does not  aspirate well. Pd drain bag attached to peritoneal port for 15 minutes and no drain. Dr. Thedore Mins contacted and informed of the findings. He states he would schedule a follow up with interventional radiology early.

## 2023-07-31 NOTE — Progress Notes (Addendum)
Attempted drain patient manually but catheter is not working. Manually aspiration done as well but was only able to aspirate 1 ml of PD fluid. Notified Dr. Thedore Mins. Unable to collect PD fluid sample.Floor RN was notified as well.

## 2023-07-31 NOTE — Assessment & Plan Note (Signed)
Estimated body mass index is 38.97 kg/m as calculated from the following:   Height as of this encounter: 5\' 3"  (1.6 m).   Weight as of this encounter: 99.8 kg.   -This will complicate overall prognosis

## 2023-07-31 NOTE — Progress Notes (Signed)
Pharmacy Antibiotic Note  Heather Lopez is a 77 y.o. female admitted on 07/30/2023 with  SBP peritonitis .  Pharmacy has been consulted for vancomycin dosing.  -Peritoneal dialysis patient -also on Ceftriaxone and fluconazole po  Plan: Patient received Vancomycin 1000mg  IV x 1 in ED -Will add Vancomycin 1500mg  IV x 1 for a total loading dose of 2500mg   (~25 mg/kg) -Obtain vancomycin serum level in 3-4 days and re-dose once level <20 mcg/mL. This interval will determine the dosing frequency. As per [facility] Antimicrobial Dosing guidelines handbook    Height: 5\' 3"  (160 cm) Weight: 99.8 kg (220 lb 0.3 oz) IBW/kg (Calculated) : 52.4  Temp (24hrs), Avg:100.2 F (37.9 C), Min:99.3 F (37.4 C), Max:101.9 F (38.8 C)  Recent Labs  Lab 07/26/23 1233 07/30/23 1731 07/31/23 0538  WBC 5.6 14.0* 12.4*  CREATININE 5.79* 5.38* 5.53*  LATICACIDVEN  --  1.8  --     Estimated Creatinine Clearance: 9.8 mL/min (A) (by C-G formula based on SCr of 5.53 mg/dL (H)).    Allergies  Allergen Reactions   Contrast Media  [Iodinated Contrast Media] Anaphylaxis   Iodine Swelling    (IV only) - angioedema    Antimicrobials this admission: Cefepime 9/3 x1   Ceftriaxone 9/4  >>   Fluconazole 9/3>> Vancomycin 9/3>>  Dose adjustments this admission:    Microbiology results: 9/3 BCx: pending 9/3 UCx: pending  Sputum:      MRSA PCR:   Peritoneal fluid:   Thank you for allowing pharmacy to be a part of this patient's care.  Yahaira Bruski A 07/31/2023 8:51 AM

## 2023-07-31 NOTE — Hospital Course (Addendum)
Taken from H&P.   Heather Lopez is a 77 y.o. female with medical history significant for end-stage renal disease on peritoneal dialysis, hypertension, gout, GERD, perforated duodenal ulcer, diabetes mellitus type 2, patient coming to [place] for abdominal pain that is been going on for about 7 days.  She was at her nephrology appointment today and had abdominal pain and peritoneal fluid collected was positive for it concerning for for peritonitis and patient was referred to the emergency room.  On initial presentation patient has mildly elevated blood pressure, afebrile but later developed fever at 101.9.  Labs pertinent for leukocytosis at 14, glucose of 232, renal function consistent with ESRD. CT abdomen and pelvis with no acute abnormalities, similar mild to moderate right hydronephrosis about the right ureteral stent with periureteral fat stranding.  Small volume abdominopelvic ascites with PD catheter in place. Moderate to large stool burden in the right colon and mild skin thickening and stranding in the anterior abdominal wall, correlate for cellulitis.  Patient was started on cefepime and vancomycin.  9/4: Some improvement in leukocytosis.  Apparently dialysis nurse was unable to drain dialysis fluid due to increased thickness despite using tPA.  IR was consulted for drainage, repeat ultrasound with no significant amount of fluid which can be drained by IR.  Mild hyperkalemia at 5.3-starting Veltassa Significant stool burden which can be contributory to her symptoms-giving soapsuds enema.  Nephrology would like to continue with broad-spectrum antibiotics for now.  9/5: Permanent HD catheter was placed by vascular surgery today.  PD catheter to be removed by general surgery likely tomorrow.  Hemodialysis will be started from tomorrow. Patient need to follow-up with outpatient urology regarding chronic hydronephrosis which seems stable, patient had stents placed in February and will need either exchange  or removal.  9/6: Cultures from peritoneal fluid drawn at nephrology office with Enterococcus faecalis resistant to vancomycin.  Antibiotics switched with amoxicillin at 1 g twice daily p.o. for 3 weeks.  PD catheter was removed by general surgery.  Need outpatient dialysis chair before discharge.  9/7: CBG significantly elevated this morning, requiring extra NovoLog, apparently she was eating rice crispy treat overnight.  Had her HD done today, will need outpatient chair before discharge.  9/8: CBG remained elevated, increasing Semglee to 18 units twice daily and mealtime coverage to 5 unit.  Awaiting outpatient confirmation of dialysis.  9/9: Hemodynamically stable.  Now had an outpatient dialysis chair starting from Thursday.  She is being discharged home to start her routine outpatient dialysis on 08/08/2023.  Patient is given amoxicillin for 20 more days to complete a 3-week course for concern of bacterial peritonitis based on culture results.  Patient will continue with rest of her home medications and need to have a close follow-up with her providers for further recommendations.

## 2023-07-31 NOTE — Assessment & Plan Note (Addendum)
Stage stage I bilateral erythema on both the buttocks.  No wound documented and reported by patient's nurse.   Wound care skin care.  Barrier protection.

## 2023-07-31 NOTE — ED Notes (Signed)
Pt is refusing Enema , may try later

## 2023-07-31 NOTE — Progress Notes (Signed)
Instilled 500 mls of PD fluid. Patient tolerated well. Will let it dwell for 2-3 hours before draining.

## 2023-07-31 NOTE — Progress Notes (Signed)
Progress Note   Patient: Heather Lopez:[id number] DOB: 08/24/1946 DOA: 07/30/2023     1 DOS: the patient was seen and examined on 07/31/2023   Brief hospital course: Taken from H&P.   Heather Lopez is a 77 y.o. female with medical history significant for end-stage renal disease on peritoneal dialysis, hypertension, gout, GERD, perforated duodenal ulcer, diabetes mellitus type 2, patient coming to [place] for abdominal pain that is been going on for about 7 days.  She was at her nephrology appointment today and had abdominal pain and peritoneal fluid collected was positive for it concerning for for peritonitis and patient was referred to the emergency room.  On initial presentation patient has mildly elevated blood pressure, afebrile but later developed fever at 101.9.  Labs pertinent for leukocytosis at 14, glucose of 232, renal function consistent with ESRD. CT abdomen and pelvis with no acute abnormalities, similar mild to moderate right hydronephrosis about the right ureteral stent with periureteral fat stranding.  Small volume abdominopelvic ascites with PD catheter in place. Moderate to large stool burden in the right colon and mild skin thickening and stranding in the anterior abdominal wall, correlate for cellulitis.  Patient was started on cefepime and vancomycin.  9/4: Some improvement in leukocytosis.  Apparently dialysis nurse was unable to drain dialysis fluid due to increased thickness despite using tPA.  IR was consulted for drainage, repeat ultrasound with no significant amount of fluid which can be drained by IR.  Mild hyperkalemia at 5.3-starting Veltassa Significant stool burden which can be contributory to her symptoms-giving soapsuds enema.  Nephrology would like to continue with broad-spectrum antibiotics for now.    Assessment and Plan: * SBP (spontaneous bacterial peritonitis) (HCC) Patient met sepsis criteria with fever and leukocytosis likely secondary to SBP. Concern of SBP  according to the initial cell count of peritoneal fluid at nephrology office, pending culture results-unable to see any of them on epic.  Nephrology would like to continue broad-spectrum antibiotics at this time. Concern of retention of fluid-repeat ultrasound with no significant amount to be drained by IR. -Continue with broad-spectrum antibiotics -Supportive care -Follow-up cultures  Abdominal pain PRN morphine.  Suspect 2/2 PD catheter dysfunction and SBP Ct ABD negative for perforation or colitis.   Hyperkalemia Secondary to renal disease.  Potassium at 5.3 -Start her on Veltassa  ESRD (end stage renal disease) (HCC) On peritoneal dialysis. Nephrology is on board Concern of catheter malfunctioning  T2DM (type 2 diabetes mellitus) (HCC) CBG elevated.  Patient was on Trulicity, 60 units of Lantus and NovoLog at home. -Semglee 15 units twice daily -SSI plus mealtime coverage   Essential hypertension Continue patient on metoprolol and clonidine.  Decubitus ulcer of sacral region, stage 1 Stage stage I bilateral erythema on both the buttocks.  No wound documented and reported by patient's nurse.   Wound care skin care.  Barrier protection.  Obesity (BMI 30-39.9) Estimated body mass index is 38.97 kg/m as calculated from the following:   Height as of this encounter: 5\' 3"  (1.6 m).   Weight as of this encounter: 99.8 kg.   -This will complicate overall prognosis      Subjective: Patient continued to have significant abdominal pain.  No nausea or vomiting.  Diffuse tenderness.  Physical Exam: Vitals:   07/31/23 1000 07/31/23 1030 07/31/23 1130 07/31/23 1200  BP: (!) 175/74 (!) 182/79 (!) 147/83 (!) 158/90  Pulse: 71 69 67 63  Resp: (!) 27 18 (!) 22 (!) 26  Temp:  TempSrc:      SpO2: 94% 96% 94% 91%  Weight:      Height:       General.  Obese elderly lady, in no acute distress. Pulmonary.  Lungs clear bilaterally, normal respiratory effort. CV.  Regular  rate and rhythm, no JVD, rub or murmur. Abdomen.  Diffuse tenderness, distended, BS positive. CNS.  Alert and oriented .  No focal neurologic deficit. Extremities.  No edema, no cyanosis, pulses intact and symmetrical. Psychiatry.  Judgment and insight appears normal.   Data Reviewed: Prior data reviewed  Family Communication: Discussed with husband at bedside  Disposition: Status is: Inpatient Remains inpatient appropriate because: Severity of illness  Planned Discharge Destination: Home  DVT prophylaxis.  Subcu heparin Time spent: 50 minutes  This record has been created using Conservation officer, historic buildings. Errors have been sought and corrected,but may not always be located. Such creation errors do not reflect on the standard of care.   Author: Arnetha Courser, MD 07/31/2023 2:40 PM  For on call review [email or web address].

## 2023-08-01 ENCOUNTER — Encounter
Admission: EM | Disposition: A | Discharge status: Home Health | Payer: Self-pay | Source: Home / Self Care | Attending: Internal Medicine

## 2023-08-01 DIAGNOSIS — L89151 Pressure ulcer of sacral region, stage 1: Secondary | ICD-10-CM

## 2023-08-01 DIAGNOSIS — N186 End stage renal disease: Secondary | ICD-10-CM | POA: Diagnosis not present

## 2023-08-01 DIAGNOSIS — K652 Spontaneous bacterial peritonitis: Secondary | ICD-10-CM | POA: Diagnosis not present

## 2023-08-01 DIAGNOSIS — R1084 Generalized abdominal pain: Secondary | ICD-10-CM | POA: Diagnosis not present

## 2023-08-01 DIAGNOSIS — Z9889 Other specified postprocedural states: Secondary | ICD-10-CM | POA: Diagnosis not present

## 2023-08-01 DIAGNOSIS — E875 Hyperkalemia: Secondary | ICD-10-CM | POA: Diagnosis not present

## 2023-08-01 DIAGNOSIS — Z992 Dependence on renal dialysis: Secondary | ICD-10-CM | POA: Diagnosis not present

## 2023-08-01 HISTORY — PX: DIALYSIS/PERMA CATHETER INSERTION: CATH118288

## 2023-08-01 LAB — GLUCOSE, CAPILLARY
Glucose-Capillary: 121 mg/dL — ABNORMAL HIGH (ref 70–99)
Glucose-Capillary: 138 mg/dL — ABNORMAL HIGH (ref 70–99)
Glucose-Capillary: 144 mg/dL — ABNORMAL HIGH (ref 70–99)
Glucose-Capillary: 283 mg/dL — ABNORMAL HIGH (ref 70–99)
Glucose-Capillary: 257 mg/dL — ABNORMAL HIGH (ref 70–99)

## 2023-08-01 LAB — RENAL FUNCTION PANEL
Albumin: 2.6 g/dL — ABNORMAL LOW (ref 3.5–5.0)
Anion gap: 12 (ref 5–15)
BUN: 64 mg/dL — ABNORMAL HIGH (ref 8–23)
CO2: 19 mmol/L — ABNORMAL LOW (ref 22–32)
Calcium: 8.7 mg/dL — ABNORMAL LOW (ref 8.9–10.3)
Chloride: 100 mmol/L (ref 98–111)
Creatinine, Ser: 5.12 mg/dL — ABNORMAL HIGH (ref 0.44–1.00)
GFR, Estimated: 8 mL/min — ABNORMAL LOW (ref >60)
Glucose, Bld: 135 mg/dL — ABNORMAL HIGH (ref 70–99)
Phosphorus: 3.8 mg/dL (ref 2.5–4.6)
Potassium: 4.6 mmol/L (ref 3.5–5.1)
Sodium: 131 mmol/L — ABNORMAL LOW (ref 135–145)

## 2023-08-01 LAB — CBC
HCT: 29.2 % — ABNORMAL LOW (ref 36.0–46.0)
Hemoglobin: 9.5 g/dL — ABNORMAL LOW (ref 12.0–15.0)
MCH: 32.2 pg (ref 26.0–34.0)
MCHC: 32.5 g/dL (ref 30.0–36.0)
MCV: 99 fL (ref 80.0–100.0)
Platelets: 333 10*3/uL (ref 150–400)
RBC: 2.95 MIL/uL — ABNORMAL LOW (ref 3.87–5.11)
RDW: 14.6 % (ref 11.5–15.5)
WBC: 9 10*3/uL (ref 4.0–10.5)
nRBC: 0 % (ref 0.0–0.2)

## 2023-08-01 LAB — URINE CULTURE: Culture: <10000 — AB

## 2023-08-01 LAB — HEPATITIS B SURFACE ANTIGEN: Hepatitis B Surface Ag: NONREACTIVE

## 2023-08-01 SURGERY — DIALYSIS/PERMA CATHETER INSERTION
Anesthesia: Moderate Sedation

## 2023-08-01 MED ORDER — FENTANYL CITRATE (PF) 100 MCG/2ML IJ SOLN
INTRAMUSCULAR | Status: DC | PRN
  Administered 2023-08-01: 50 ug via INTRAVENOUS

## 2023-08-01 MED ORDER — MIDAZOLAM HCL 2 MG/ML PO SYRP: 8.0000 mg | ORAL_SOLUTION | Freq: Once | ORAL | Status: DC | PRN

## 2023-08-01 MED ORDER — CEFAZOLIN SODIUM-DEXTROSE 1-4 GM/50ML-% IV SOLN
INTRAVENOUS | Status: AC
  Filled 2023-08-01: qty 50

## 2023-08-01 MED ORDER — SODIUM CHLORIDE 0.9 % IV SOLN: INTRAVENOUS | Status: DC

## 2023-08-01 MED ORDER — MIDAZOLAM HCL 2 MG/2ML IJ SOLN
INTRAMUSCULAR | Status: AC
  Filled 2023-08-01: qty 2

## 2023-08-01 MED ORDER — ONDANSETRON HCL 4 MG/2ML IJ SOLN: 4.0000 mg | Freq: Four times a day (QID) | INTRAMUSCULAR | Status: DC | PRN

## 2023-08-01 MED ORDER — MIDAZOLAM HCL 2 MG/2ML IJ SOLN
INTRAMUSCULAR | Status: DC | PRN
  Administered 2023-08-01: 1 mg via INTRAVENOUS

## 2023-08-01 MED ORDER — FENTANYL CITRATE PF 50 MCG/ML IJ SOSY: 12.5000 ug | PREFILLED_SYRINGE | Freq: Once | INTRAMUSCULAR | Status: DC | PRN

## 2023-08-01 MED ORDER — CEFAZOLIN SODIUM-DEXTROSE 1-4 GM/50ML-% IV SOLN
1.0000 g | INTRAVENOUS | Status: AC
  Administered 2023-08-01: 1 g via INTRAVENOUS
  Filled 2023-08-01: qty 50

## 2023-08-01 MED ORDER — DIPHENHYDRAMINE HCL 50 MG/ML IJ SOLN: 50.0000 mg | Freq: Once | INTRAMUSCULAR | Status: DC | PRN

## 2023-08-01 MED ORDER — CHLORHEXIDINE GLUCONATE CLOTH 2 % EX PADS
6.0000 | MEDICATED_PAD | Freq: Every day | CUTANEOUS | Status: DC
  Administered 2023-08-04 – 2023-08-05 (×2): 6 via TOPICAL

## 2023-08-01 MED ORDER — METHYLPREDNISOLONE SODIUM SUCC 125 MG IJ SOLR: 125.0000 mg | Freq: Once | INTRAMUSCULAR | Status: DC | PRN

## 2023-08-01 MED ORDER — HYDROMORPHONE HCL 1 MG/ML IJ SOLN: 1.0000 mg | Freq: Once | INTRAMUSCULAR | Status: DC | PRN

## 2023-08-01 MED ORDER — LACTULOSE 10 GM/15ML PO SOLN
30.0000 g | Freq: Two times a day (BID) | ORAL | Status: DC
  Administered 2023-08-01 – 2023-08-04 (×3): 30 g via ORAL
  Filled 2023-08-01 (×8): qty 60

## 2023-08-01 MED ORDER — HEPARIN SODIUM (PORCINE) 10000 UNIT/ML IJ SOLN
INTRAMUSCULAR | Status: AC
  Filled 2023-08-01: qty 1

## 2023-08-01 MED ORDER — FENTANYL CITRATE PF 50 MCG/ML IJ SOSY
PREFILLED_SYRINGE | INTRAMUSCULAR | Status: AC
  Filled 2023-08-01: qty 1

## 2023-08-01 MED ORDER — FAMOTIDINE 20 MG PO TABS: 40.0000 mg | ORAL_TABLET | Freq: Once | ORAL | Status: DC | PRN

## 2023-08-01 SURGICAL SUPPLY — 7 items
ADH SKN CLS APL DERMABOND .7 (GAUZE/BANDAGES/DRESSINGS) ×1
BIOPATCH RED 1 DISK 7.0 (GAUZE/BANDAGES/DRESSINGS) IMPLANT
CATH PALIN MAXID VT KIT 19CM (CATHETERS) IMPLANT
COVER PROBE ULTRASOUND 5X96 (MISCELLANEOUS) IMPLANT
DERMABOND ADVANCED .7 DNX12 (GAUZE/BANDAGES/DRESSINGS) IMPLANT
SUT MNCRL AB 4-0 PS2 18 (SUTURE) IMPLANT
SUT PROLENE 0 CT 1 30 (SUTURE) IMPLANT

## 2023-08-01 NOTE — Assessment & Plan Note (Signed)
Resolved with Veltassa. -Continue to monitor

## 2023-08-01 NOTE — Op Note (Signed)
OPERATIVE NOTE    PRE-OPERATIVE DIAGNOSIS: 1. ESRD 2. Failed PD catheter placement  POST-OPERATIVE DIAGNOSIS: same as above  PROCEDURE: Ultrasound guidance for vascular access to the right internal jugular vein Fluoroscopic guidance for placement of catheter Placement of a 19 cm tip to cuff tunneled hemodialysis catheter via the right internal jugular vein  SURGEON: Festus Barren, MD  ANESTHESIA:  Local with Moderate conscious sedation for approximately 16 minutes using 1 mg of Versed and 50 mcg of Fentanyl  ESTIMATED BLOOD LOSS: 5 cc  FLUORO TIME: less than one minute  CONTRAST: none  FINDING(S): 1.  Patent right internal jugular vein  SPECIMEN(S):  None  INDICATIONS:   Heather Lopez is a 77 y.o.female who presents with renal failure and a failed attempt at peritoneal dialysis.  The patient needs long term dialysis access for their ESRD, and a Permcath is necessary.  Risks and benefits are discussed and informed consent is obtained.    DESCRIPTION: After obtaining full informed written consent, the patient was brought back to the vascular suited. The patient's right neck and chest were sterilely prepped and draped in a sterile surgical field was created. Moderate conscious sedation was administered during a face to face encounter with the patient throughout the procedure with my supervision of the RN administering medicines and monitoring the patient's vital signs, pulse oximetry, telemetry and mental status throughout from the start of the procedure until the patient was taken to the recovery room.  The right internal jugular vein was visualized with ultrasound and found to be patent. It was then accessed under direct ultrasound guidance and a permanent image was recorded. A wire was placed. After skin nick and dilatation, the peel-away sheath was placed over the wire. I then turned my attention to an area under the clavicle. Approximately 1-2 fingerbreadths below the clavicle a small  counterincision was created and tunneled from the subclavicular incision to the access site. Using fluoroscopic guidance, a 19 centimeter tip to cuff tunneled hemodialysis catheter was selected, and tunneled from the subclavicular incision to the access site. It was then placed through the peel-away sheath and the peel-away sheath was removed. Using fluoroscopic guidance the catheter tips were parked in the right atrium. The appropriate distal connectors were placed. It withdrew blood well and flushed easily with heparinized saline and a concentrated heparin solution was then placed. It was secured to the chest wall with 2 Prolene sutures. The access incision was closed single 4-0 Monocryl. A 4-0 Monocryl pursestring suture was placed around the exit site. Sterile dressings were placed. The patient tolerated the procedure well and was taken to the recovery room in stable condition.  COMPLICATIONS: None  CONDITION: Stable  Festus Barren, MD 08/01/2023 12:38 PM   This note was created with Dragon Medical transcription system. Any errors in dictation are purely unintentional.

## 2023-08-01 NOTE — Consult Note (Signed)
Hospital Consult    Reason for Consult:  Non functional peritoneal Dialysis Catheter Requesting Physician:  Malachi Carl NP MRN #:  [id number]  History of Present Illness: This is a 77 y.o. female who was admitted on 07/30/2023 with SBP peritonitis.  Patient has a past medical history that includes peritoneal dialysis due to end-stage renal disease.  Patient endorses that she received this PD catheter a month ago and now she is able to instill fluid through the catheter but is unable to drain.  Vascular surgery was consulted today for placement of a dialysis permacatheter for hemodialysis access.  Patient at this time only endorses abdominal pain but denies any chest pain shortness of breath dizziness or blurred vision.  Past Medical History:  Diagnosis Date   Acute renal failure (HCC) 06/27/2021   Anxiety    Aortic atherosclerosis (HCC)    Cataract    Depression    Dysrhythmia    GERD (gastroesophageal reflux disease)    Gout    History of kidney stones    Hyperlipidemia    Hypertension    Nausea & vomiting 12/02/2021   Obesity    Obstruction of left ureteropelvic junction (UPJ) due to stone 06/2021   Osteoarthritis    Perforated duodenal ulcer (HCC) 06/27/2021   Pyonephrosis 06/2021   left   Septic shock (HCC) 06/27/2021   Type 2 diabetes mellitus with stage 5 chronic kidney disease (HCC)    Vertigo     Past Surgical History:  Procedure Laterality Date   CATARACT EXTRACTION, BILATERAL Bilateral 2019   COLONOSCOPY WITH PROPOFOL N/A 08/19/2015   Procedure: COLONOSCOPY WITH PROPOFOL;  Surgeon: Midge Minium, MD;  Location: [facility];  Service: Endoscopy;  Laterality: N/A;  diabetic - insulin   CYSTOSCOPY W/ RETROGRADES Bilateral 12/22/2019   Procedure: CYSTOSCOPY WITH RETROGRADE PYELOGRAM;  Surgeon: Riki Altes, MD;  Location: [facility];  Service: Urology;  Laterality: Bilateral;   CYSTOSCOPY W/ URETERAL STENT PLACEMENT Bilateral 07/05/2021   Procedure:  CYSTOSCOPY WITH RETROGRADE PYELOGRAM/BILATERAL URETERAL STENT PLACEMENT;  Surgeon: Riki Altes, MD;  Location: [facility];  Service: Urology;  Laterality: Bilateral;   CYSTOSCOPY W/ URETERAL STENT PLACEMENT Bilateral 12/26/2021   Procedure: CYSTOSCOPY WITH STENT REMOVAL;  Surgeon: Riki Altes, MD;  Location: [facility];  Service: Urology;  Laterality: Bilateral;   CYSTOSCOPY W/ URETERAL STENT PLACEMENT Right 03/13/2022   Procedure: CYSTOSCOPY WITH RETROGRADE PYELOGRAM/URETERAL STENT PLACEMENT;  Surgeon: Riki Altes, MD;  Location: [facility];  Service: Urology;  Laterality: Right;   CYSTOSCOPY W/ URETERAL STENT PLACEMENT Right 01/01/2023   Procedure: CYSTOSCOPY WITH STENT EXCHANGE;  Surgeon: Riki Altes, MD;  Location: [facility];  Service: Urology;  Laterality: Right;   CYSTOSCOPY W/ URETERAL STENT REMOVAL Right 05/08/2022   Procedure: CYSTOSCOPY WITH STENT REMOVAL;  Surgeon: Riki Altes, MD;  Location: [facility];  Service: Urology;  Laterality: Right;   CYSTOSCOPY WITH BIOPSY N/A 12/22/2019   Procedure: CYSTOSCOPY WITH bladder BIOPSY;  Surgeon: Riki Altes, MD;  Location: [facility];  Service: Urology;  Laterality: N/A;   CYSTOSCOPY WITH BIOPSY  03/13/2022   Procedure: CYSTOSCOPY WITH BLADDER BIOPSY;  Surgeon: Riki Altes, MD;  Location: [facility];  Service: Urology;;   CYSTOSCOPY WITH STENT PLACEMENT Right 12/22/2019   Procedure: CYSTOSCOPY WITH STENT PLACEMENT;  Surgeon: Riki Altes, MD;  Location: [facility];  Service: Urology;  Laterality: Right;   CYSTOSCOPY WITH STENT PLACEMENT Right 07/03/2022   Procedure: CYSTOSCOPY/RIGHT URETEROSCOPY WITH STENT PLACEMENT;  Surgeon: Riki Altes, MD;  Location: [facility];  Service: Urology;  Laterality: Right;   CYSTOSCOPY WITH URETEROSCOPY Bilateral 12/22/2019   Procedure: CYSTOSCOPY WITH URETEROSCOPY;  Surgeon: Riki Altes, MD;  Location: [facility];  Service: Urology;  Laterality: Bilateral;   CYSTOSCOPY/URETEROSCOPY/HOLMIUM  LASER/STENT PLACEMENT Right 12/22/2019   Procedure: CYSTOSCOPY/URETEROSCOPY/HOLMIUM LASER/STENT PLACEMENT;  Surgeon: Riki Altes, MD;  Location: [facility];  Service: Urology;  Laterality: Right;   CYSTOSCOPY/URETEROSCOPY/HOLMIUM LASER/STENT PLACEMENT Bilateral 12/08/2021   Procedure: CYSTOSCOPY/URETEROSCOPY/HOLMIUM LASER/STENT PLACEMENT;  Surgeon: Sondra Come, MD;  Location: [facility];  Service: Urology;  Laterality: Bilateral;   DIALYSIS/PERMA CATHETER INSERTION N/A 06/29/2021   Procedure: DIALYSIS/PERMA CATHETER INSERTION;  Surgeon: Annice Needy, MD;  Location: [facility];  Service: Cardiovascular;  Laterality: N/A;   DIALYSIS/PERMA CATHETER INSERTION N/A 07/13/2021   Procedure: DIALYSIS/PERMA CATHETER INSERTION;  Surgeon: Annice Needy, MD;  Location: [facility];  Service: Cardiovascular;  Laterality: N/A;   EYE SURGERY     IR GASTROSTOMY TUBE MOD SED  08/01/2021   LAPAROTOMY N/A 06/29/2021   Procedure: EXPLORATORY LAPAROTOMY WITH REPAIR OF DUODENAL PERFORATION;  Surgeon: Henrene Dodge, MD;  Location: [facility];  Service: General;  Laterality: N/A;   POLYPECTOMY  08/19/2015   Procedure: POLYPECTOMY;  Surgeon: Midge Minium, MD;  Location: [facility];  Service: Endoscopy;;   TUBAL LIGATION  1992    Allergies  Allergen Reactions   Contrast Media  [Iodinated Contrast Media] Anaphylaxis   Iodine Swelling    (IV only) - angioedema    Prior to Admission medications   Medication Sig Start Date End Date Taking? Authorizing Provider  aspirin 81 MG EC tablet Take 81 mg by mouth daily.   Yes [provider]  atorvastatin (LIPITOR) 20 MG tablet Take 20 mg by mouth daily.   Yes [provider]  buPROPion (WELLBUTRIN XL) 150 MG 24 hr tablet Take 150 mg by mouth daily.   Yes [provider]  busPIRone (BUSPAR) 5 MG tablet Take 5 mg by mouth daily.   Yes [provider]  cephALEXin (KEFLEX) 500 MG capsule Take 1 capsule (500 mg total) by  mouth 2 (two) times daily. 07/26/23  Yes Minna Antis, MD  cloNIDine (CATAPRES) 0.1 MG tablet Take 1 tablet (0.1 mg total) by mouth 2 (two) times daily. 12/11/21  Yes Enedina Finner, MD  ferrous sulfate 325 (65 FE) MG tablet Take 325 mg by mouth daily with breakfast.   Yes [provider]  insulin aspart (NOVOLOG) 100 UNIT/ML injection Inject 10 Units into the skin 3 (three) times daily before meals.   Yes [provider]  insulin glargine (LANTUS) 100 UNIT/ML injection Inject 60 Units into the skin daily.   Yes [provider]  lansoprazole (PREVACID) 30 MG capsule Take 30 mg by mouth at bedtime.   Yes [provider]  loperamide (IMODIUM A-D) 2 MG tablet Take 2 mg by mouth every 6 (six) hours as needed for diarrhea or loose stools.   Yes [provider]  metoprolol tartrate (LOPRESSOR) 100 MG tablet Take 1 tablet (100 mg total) by mouth 2 (two) times daily. 12/11/21  Yes Enedina Finner, MD  Multiple Vitamin (MULTIVITAMIN WITH MINERALS) TABS tablet Take 1 tablet by mouth daily. 12/12/21  Yes Enedina Finner, MD  ondansetron (ZOFRAN) 4 MG tablet Take 4 mg by mouth every 8 (eight) hours as needed. 06/28/23  Yes [provider]  polyethylene glycol (MIRALAX / GLYCOLAX) 17 g packet Take  17 g by mouth daily as needed for mild constipation or moderate constipation.   Yes [provider]  senna-docusate (SENOKOT-S) 8.6-50 MG tablet Take 1 tablet by mouth at bedtime as needed for mild constipation or moderate constipation.   Yes [provider]  sodium bicarbonate 650 MG tablet Take 650 mg by mouth 2 (two) times daily.   Yes [provider]  trospium (SANCTURA) 20 MG tablet Take 1 tablet (20 mg total) by mouth 2 (two) times daily. 04/18/23 04/12/24 Yes McGowan, Carollee Herter A, PA-C  VELTASSA 8.4 g packet Take 8.4 g by mouth daily. MIX 1 PACKET IN 1/3 CUP OF WATER AND DRINK BY MOUTH DAILY. 06/19/22  Yes [provider]  conjugated  estrogens (PREMARIN) vaginal cream Apply one pea-sized amount around the opening of the urethra daily for 2 weeks, then 3 times weekly moving forward. Patient not taking: Reported on 07/31/2023 06/07/22   Carman Ching, PA-C  Dulaglutide (TRULICITY) 1.5 MG/0.5ML SOPN Inject 1.5 mg into the skin once a week. Sunday Patient not taking: Reported on 07/31/2023    [provider]    Social History   Socioeconomic History   Marital status: Married    Spouse name: [name]   Number of children: Not on file   Years of education: Not on file   Highest education level: Not on file  Occupational History   Not on file  Tobacco Use   Smoking status: Former    Current packs/day: 0.00    Average packs/day: 0.8 packs/day for 6.0 years (4.5 ttl pk-yrs)    Types: Cigarettes    Start date: 19    Quit date: 77    Years since quitting: 51.7   Smokeless tobacco: Never   Tobacco comments:    quit 1973  Vaping Use   Vaping status: Never Used  Substance and Sexual Activity   Alcohol use: No    Alcohol/week: 0.0 standard drinks of alcohol   Drug use: No   Sexual activity: Not on file  Other Topics Concern   Not on file  Social History Narrative   Not on file   Social Determinants of Health   Financial Resource Strain: Low Risk  (07/14/2023)   Received [facility]   Overall Financial Resource Strain (CARDIA)    Difficulty of Paying Living Expenses: Not very hard  Food Insecurity: No Food Insecurity (07/31/2023)   Hunger Vital Sign    Worried About Running Out of Food in the Last Year: Never true    Ran Out of Food in the Last Year: Never true  Transportation Needs: No Transportation Needs (07/31/2023)   PRAPARE - Administrator, Civil Service (Medical): No    Lack of Transportation (Non-Medical): No  Physical Activity: Unknown (07/14/2023)   Received from [facility]   Exercise Vital Sign    Days of Exercise per Week: 0 days    Minutes of Exercise per  Session: Not on file  Stress: No Stress Concern Present (07/14/2023)   Received from [facility] - Occupational Stress Questionnaire    Feeling of Stress : Not at all  Social Connections: Socially Integrated (07/14/2023)   Received from [facility]   Social Network    How would you rate your social network (family, work, friends)?: Good participation with social networks  Intimate Partner Violence: Not At Risk (07/31/2023)   Humiliation, Afraid, Rape, and Kick questionnaire    Fear of Current or  Ex-Partner: No    Emotionally Abused: No    Physically Abused: No    Sexually Abused: No     Family History  Problem Relation Age of Onset   Diabetes Father    Cancer Father        lung cancer   Diabetes Brother    Stroke Mother     ROS: Otherwise negative unless mentioned in HPI  Physical Examination  Vitals:   08/01/23 0358 08/01/23 0835  BP: (!) 164/63 (!) 151/71  Pulse: 77 72  Resp: 18 16  Temp: 100.1 F (37.8 C) 98.5 F (36.9 C)  SpO2: 95% 93%   Body mass index is 38.97 kg/m.  General:  WDWN in NAD Gait: Not observed HENT: WNL, normocephalic Pulmonary: normal non-labored breathing, without Rales, rhonchi,  wheezing Cardiac: regular, without  Murmurs, rubs or gallops; without carotid bruits Abdomen: Positive faint bowel sounds.  Soft, positive for tenderness and distention. no masses Skin: without rashes Vascular Exam/Pulses: Palpable pulses throughout Extremities: without ischemic changes, without Gangrene , without cellulitis; without open wounds;  Musculoskeletal: no muscle wasting or atrophy  Neurologic: A&O X 3;  No focal weakness or paresthesias are detected; speech is fluent/normal Psychiatric:  The pt has Normal affect. Lymph:  Unremarkable  CBC    Component Value Date/Time   WBC 9.0 08/01/2023 0524   RBC 2.95 (L) 08/01/2023 0524   HGB 9.5 (L) 08/01/2023 0524   HGB 10.3 (L) 03/08/2022 1545   HCT 29.2 (L)  08/01/2023 0524   HCT 31.2 (L) 03/08/2022 1545   PLT 333 08/01/2023 0524   PLT 355 02/10/2022 0939   MCV 99.0 08/01/2023 0524   MCV 84 03/08/2022 1545   MCV 89 01/28/2014 0513   MCH 32.2 08/01/2023 0524   MCHC 32.5 08/01/2023 0524   RDW 14.6 08/01/2023 0524   RDW 16.6 (H) 03/08/2022 1545   RDW 14.2 01/28/2014 0513   LYMPHSABS 1.5 07/30/2023 1731   LYMPHSABS 2.0 03/08/2022 1545   LYMPHSABS 1.3 01/28/2014 0513   MONOABS 0.8 07/30/2023 1731   MONOABS 0.4 01/28/2014 0513   EOSABS 0.1 07/30/2023 1731   EOSABS 0.2 03/08/2022 1545   EOSABS 0.0 01/28/2014 0513   BASOSABS 0.1 07/30/2023 1731   BASOSABS 0.1 03/08/2022 1545   BASOSABS 0.1 01/28/2014 0513    BMET    Component Value Date/Time   NA 131 (L) 08/01/2023 0524   NA 138 10/03/2022 1350   NA 133 (L) 01/29/2014 0406   K 4.6 08/01/2023 0524   K 4.0 01/29/2014 0406   CL 100 08/01/2023 0524   CL 96 (L) 01/29/2014 0406   CO2 19 (L) 08/01/2023 0524   CO2 29 01/29/2014 0406   GLUCOSE 135 (H) 08/01/2023 0524   GLUCOSE 377 (H) 01/29/2014 0406   BUN 64 (H) 08/01/2023 0524   BUN 39 (H) 10/03/2022 1350   BUN 31 (H) 01/29/2014 0406   CREATININE 5.12 (H) 08/01/2023 0524   CREATININE 1.17 (H) 03/17/2018 0830   CALCIUM 8.7 (L) 08/01/2023 0524   CALCIUM 9.7 01/29/2014 0406   GFRNONAA 8 (L) 08/01/2023 0524   GFRNONAA 48 (L) 08/21/2016 0908   GFRAA 29 (L) 12/22/2019 0732   GFRAA 55 (L) 08/21/2016 0908    COAGS: Lab Results  Component Value Date   INR 1.2 07/16/2021   INR 1.4 (H) 06/27/2021     Non-Invasive Vascular Imaging:   EXAM:07/30/23 CT ABDOMEN AND PELVIS WITHOUT CONTRAST   TECHNIQUE: Multidetector CT imaging of the abdomen and pelvis was  performed following the standard protocol without IV contrast.   RADIATION DOSE REDUCTION: This exam was performed according to the departmental dose-optimization program which includes automated exposure control, adjustment of the mA and/or kV according to patient size and/or use  of iterative reconstruction technique.   COMPARISON:  CT abdomen and pelvis 07/26/2023   FINDINGS: Lower chest: Bibasilar atelectasis/scarring.   Hepatobiliary: Unremarkable liver. Normal gallbladder. No biliary dilation.   Pancreas: Unremarkable.   Spleen: Unremarkable.   Adrenals/Urinary Tract: Normal adrenal glands. Similar mild-to-moderate right hydroureteronephrosis about the right ureteral stent. Periureteral fat stranding is similar. Unchanged mild ectasia of the left renal collecting system and ureter. Nonobstructing stone or parenchymal calcification lower pole of the right kidney. No ureteral stones. Unremarkable bladder.   Stomach/Bowel: Normal caliber large and small bowel. Moderate to large stool burden in the right colon. No bowel wall thickening. The appendix measures 7 mm diameter. There is mild fluid and stranding about the appendix however this is favored due to mesenteric edema/peritoneum dialysis.Stomach is within normal limits.   Vascular/Lymphatic: Aortic atherosclerosis. No enlarged abdominal or pelvic lymph nodes.   Reproductive: Unremarkable.   Other: Peritoneal dialysis catheter. Mild mesenteric edema. Small volume free fluid about the liver, in the pelvis and pericolic gutters. Small locule of free air in the low central abdomen adjacent to the dialysis catheter. Similar skin thickening and mild stranding in the anterior abdominal wall.   Musculoskeletal: No acute fracture. Advanced arthritis in the right-greater-than-left hips.   IMPRESSION: 1. Similar mild-to-moderate right hydroureteronephrosis about the right ureteral stent with periureteral fat stranding. 2. Peritoneal dialysis catheter with small volume abdominopelvic ascites. 3. There is mild stranding and free fluid about the appendix which is normal in caliber. This is favored due to mesenteric edema and peritoneal dialysis. 4. Moderate to large stool burden in the right colon.  Correlate for constipation. 5. Mild skin thickening and stranding in the anterior abdominal wall. Correlate for cellulitis.   Aortic Atherosclerosis (ICD10-I70.0).  Statin:  Yes.   Beta Blocker:  Yes.   Aspirin:  Yes.   ACEI:  No. ARB:  No. CCB use:  No Other antiplatelets/anticoagulants:  No.    ASSESSMENT/PLAN: This is a 77 y.o. female who presents to [facility] with a nonfunctioning peritoneal dialysis catheter and peritonitis.  Patient has a long medical history for end-stage renal disease.  She now requires access for hemodialysis.  Vascular surgery was consulted for placement of a permacatheter dialysis access.  PLAN: Vascular surgery plans on taking the patient to the vascular lab today for hemodialysis permacatheter access placement.  I discussed in detail with the patient and her husband at the bedside the procedure, benefits, risks, and complications.  Both verbalized her understanding.  Both would like to proceed as soon as possible.  I answered all the patient's questions this afternoon.  Patient states she has been n.p.o. for the last couple days.  Plan is for permacatheter placement sometime today.   -I discussed the plan in detail with Dr. Festus Barren MD and he is in agreement with plan.   Marcie Bal [facility] 08/01/2023 8:49 AM

## 2023-08-01 NOTE — Plan of Care (Signed)
  Problem: Pain Managment: Goal: General experience of comfort will improve Outcome: Progressing   Problem: Safety: Goal: Ability to remain free from injury will improve Outcome: Progressing   Problem: Skin Integrity: Goal: Risk for impaired skin integrity will decrease Outcome: Progressing   

## 2023-08-01 NOTE — Assessment & Plan Note (Addendum)
Patient was on peritoneal dialysis, malfunctioning PD catheter so a permanent HD catheter was placed  by vascular surgery.  PD catheter removed -Patient to start on hemodialysis -Nephrology is on board -Will need outpatient chair before discharge

## 2023-08-01 NOTE — Progress Notes (Signed)
Progress Note   Patient: Heather Lopez ZOX:[id number] DOB: 24-Apr-1946 DOA: 07/30/2023     2 DOS: the patient was seen and examined on 08/01/2023   Brief hospital course: Taken from H&P.   Heather Lopez is a 77 y.o. female with medical history significant for end-stage renal disease on peritoneal dialysis, hypertension, gout, GERD, perforated duodenal ulcer, diabetes mellitus type 2, patient coming to [place] for abdominal pain that is been going on for about 7 days.  She was at her nephrology appointment today and had abdominal pain and peritoneal fluid collected was positive for it concerning for for peritonitis and patient was referred to the emergency room.  On initial presentation patient has mildly elevated blood pressure, afebrile but later developed fever at 101.9.  Labs pertinent for leukocytosis at 14, glucose of 232, renal function consistent with ESRD. CT abdomen and pelvis with no acute abnormalities, similar mild to moderate right hydronephrosis about the right ureteral stent with periureteral fat stranding.  Small volume abdominopelvic ascites with PD catheter in place. Moderate to large stool burden in the right colon and mild skin thickening and stranding in the anterior abdominal wall, correlate for cellulitis.  Patient was started on cefepime and vancomycin.  9/4: Some improvement in leukocytosis.  Apparently dialysis nurse was unable to drain dialysis fluid due to increased thickness despite using tPA.  IR was consulted for drainage, repeat ultrasound with no significant amount of fluid which can be drained by IR.  Mild hyperkalemia at 5.3-starting Veltassa Significant stool burden which can be contributory to her symptoms-giving soapsuds enema.  Nephrology would like to continue with broad-spectrum antibiotics for now.  9/5: Permanent HD catheter was placed by vascular surgery today.  PD catheter to be removed by general surgery likely tomorrow.  Hemodialysis will be started from  tomorrow. Patient need to follow-up with outpatient urology regarding chronic hydronephrosis which seems stable, patient had stents placed in February and will need either exchange or removal.   Assessment and Plan: * SBP (spontaneous bacterial peritonitis) (HCC) Patient met sepsis criteria with fever and leukocytosis likely secondary to SBP. Concern of SBP according to the initial cell count of peritoneal fluid at nephrology office, pending culture results-unable to see any of them on epic.  Nephrology would like to continue broad-spectrum antibiotics at this time. Concern of retention of fluid-repeat ultrasound with no significant amount to be drained by IR. -Continue with broad-spectrum antibiotics -Supportive care -Follow-up cultures  Abdominal pain PRN morphine.  Suspect 2/2 PD catheter dysfunction and SBP Ct ABD negative for perforation or colitis.   Hyperkalemia Resolved with Veltassa. -Continue to monitor  ESRD (end stage renal disease) (HCC) Patient was on peritoneal dialysis, malfunctioning PD catheter so a permanent HD catheter was placed today by vascular surgery.  PD catheter to be removed for general surgery tomorrow. -Patient to start on hemodialysis -Nephrology is on board  T2DM (type 2 diabetes mellitus) (HCC) CBG elevated.  Patient was on Trulicity, 60 units of Lantus and NovoLog at home. -Semglee 15 units twice daily -SSI plus mealtime coverage   Essential hypertension Continue patient on metoprolol and clonidine.  Decubitus ulcer of sacral region, stage 1 Stage stage I bilateral erythema on both the buttocks.  No wound documented and reported by patient's nurse.   Wound care skin care.  Barrier protection.  Obesity (BMI 30-39.9) Estimated body mass index is 38.97 kg/m as calculated from the following:   Height as of this encounter: 5\' 3"  (1.6 m).   Weight  as of this encounter: 99.8 kg.   -This will complicate overall prognosis      Subjective:  Patient continued to have some abdominal pain.  Going for HD catheter placement by vascular surgery.  Physical Exam: Vitals:   08/01/23 1245 08/01/23 1300 08/01/23 1315 08/01/23 1330  BP: (!) 155/63 (!) 168/77 (!) 167/75 (!) 156/69  Pulse: 75 71 67 67  Resp: 20 18 20 18   Temp:      TempSrc:      SpO2: 93% 95% 93% 94%  Weight:      Height:       General.  Obese lady, in no acute distress. Pulmonary.  Lungs clear bilaterally, normal respiratory effort. CV.  Regular rate and rhythm, no JVD, rub or murmur. Abdomen.  Distended abdomen with diffuse tenderness, BS positive. CNS.  Alert and oriented .  No focal neurologic deficit. Extremities.  No edema, no cyanosis, pulses intact and symmetrical.  Data Reviewed: Prior data reviewed  Family Communication:   Disposition: Status is: Inpatient Remains inpatient appropriate because: Severity of illness  Planned Discharge Destination: Home  DVT prophylaxis.  Subcu heparin Time spent: 45 minutes  This record has been created using Conservation officer, historic buildings. Errors have been sought and corrected,but may not always be located. Such creation errors do not reflect on the standard of care.   Author: Arnetha Courser, MD 08/01/2023 3:57 PM  For on call review [email or web address].

## 2023-08-01 NOTE — Progress Notes (Signed)
Pharmacy Antibiotic Note  Heather Lopez is a 77 y.o. female admitted on 07/30/2023 with  SBP peritonitis .  PMH includes peritoneal dialysis. Pharmacy has been consulted for vancomycin dosing.   Plan: Patient received LD of Vancomycin 2500 mg IV on 9/4 @ 10am Also on  Ceftriaxone and fluconazole per MD orders Plan to obtain vancomycin serum level 3-4 days after LD and re-dose once level <20 mcg/mL. This interval will determine the dosing frequency. As per [facility] Antimicrobial Dosing guidelines handbook  Height: 5\' 3"  (160 cm) Weight: 101.3 kg (223 lb 5.2 oz) IBW/kg (Calculated) : 52.4  Temp (24hrs), Avg:98.8 F (37.1 C), Min:98 F (36.7 C), Max:100.1 F (37.8 C)  Recent Labs  Lab 07/26/23 1233 07/30/23 1731 07/31/23 0538 08/01/23 0524  WBC 5.6 14.0* 12.4* 9.0  CREATININE 5.79* 5.38* 5.53* 5.12*  LATICACIDVEN  --  1.8  --   --     Estimated Creatinine Clearance: 10.6 mL/min (A) (by C-G formula based on SCr of 5.12 mg/dL (H)).    Allergies  Allergen Reactions   Contrast Media  [Iodinated Contrast Media] Anaphylaxis   Iodine Swelling    (IV only) - angioedema    Antimicrobials this admission: Cefepime 9/3 x1   Ceftriaxone 9/4  >>   Fluconazole 9/3>> Vancomycin 9/3>>  Dose adjustments this admission:n/a   Microbiology results: 9/3 BCx: NG x 2 Days 9/3 UCx: insignificant growth  Thank you for allowing pharmacy to be a part of this patient's care.  Slayter Moorhouse Rodriguez-Guzman PharmD, BCPS 08/01/2023 10:39 AM

## 2023-08-01 NOTE — Progress Notes (Signed)
[facility]  ROUNDING NOTE   Subjective:   [name] was admitted to [facility] on 07/30/2023 for Peritonitis [facility]) [K65.9] ESRD on peritoneal dialysis (HCC) [N18.6, Z99.2] Abdominal pain [R10.9]  Patient was sent [place] from [facility] [place] due to a malfunctioning PD catheter.  Patient seen this am with husband  NPO for permcath placement   Objective:  Vital signs in last 24 hours:  Temp:  [98 F (36.7 C)-100.1 F (37.8 C)] 98.5 F (36.9 C) (09/05 0835) Pulse Rate:  [66-78] 67 (09/05 1330) Resp:  [10-28] 18 (09/05 1330) BP: (142-172)/(60-115) 156/69 (09/05 1330) SpO2:  [93 %-99 %] 94 % (09/05 1330) Weight:  [101.3 kg] 101.3 kg (09/05 0700)  Weight change: 1.5 kg Filed Weights   07/30/23 1726 08/01/23 0700  Weight: 99.8 kg 101.3 kg    Intake/Output: I/O last 3 completed shifts: In: 381.3 [IV Piggyback:381.3] Out: 1650 [Urine:1650]   Intake/Output this shift:  No intake/output data recorded.  Physical Exam: General: NAD  Head: Normocephalic, atraumatic. Dry oral mucosal membranes  Eyes: Anicteric  Lungs:  Clear to auscultation,  O2  Heart: Regular rate and rhythm  Abdomen:  Soft, tenderness, distention  Extremities:  no peripheral edema.   Neurologic: Alert, moving all four extremities  Skin: No lesions  Access: Peritoneal dialysis catheter    Basic Metabolic Panel: Recent Labs  Lab 07/26/23 1233 07/30/23 1731 07/31/23 0538 08/01/23 0524  NA 132* 133* 133* 131*  K 4.3 4.8 5.3* 4.6  CL 99 99 102 100  CO2 21* 20* 21* 19*  GLUCOSE 201* 232* 212* 135*  BUN 61* 63* 67* 64*  CREATININE 5.79* 5.38* 5.53* 5.12*  CALCIUM 8.4* 9.2 8.7* 8.7*  PHOS  --   --   --  3.8    Liver Function Tests: Recent Labs  Lab 07/26/23 1233 07/30/23 1731 07/31/23 0538 08/01/23 0524  AST 15 16 14*  --   ALT 18 26 20   --   ALKPHOS 94 103 94  --   BILITOT 0.5 0.6 0.7  --   PROT 6.6 7.8 7.0  --   ALBUMIN 2.9* 3.4* 2.8* 2.6*   Recent Labs  Lab  07/26/23 1233  LIPASE 40   No results for input(s): "AMMONIA" in the last 168 hours.  CBC: Recent Labs  Lab 07/26/23 1233 07/30/23 1731 07/31/23 0538 08/01/23 0524  WBC 5.6 14.0* 12.4* 9.0  NEUTROABS  --  11.5*  --   --   HGB 8.9* 11.6* 10.2* 9.5*  HCT 28.2* 37.7 31.6* 29.2*  MCV 102.5* 105.0* 100.3* 99.0  PLT 266 408* 336 333    Cardiac Enzymes: No results for input(s): "CKTOTAL", "CKMB", "CKMBINDEX", "TROPONINI" in the last 168 hours.  BNP: Invalid input(s): "POCBNP"  CBG: Recent Labs  Lab 07/31/23 1741 07/31/23 2055 08/01/23 0832 08/01/23 1109 08/01/23 1249  GLUCAP 226* 150* 121* 138* 144*    Microbiology: Results for orders placed or performed during the hospital encounter of 07/30/23  Culture, blood (routine x 2)     Status: None (Preliminary result)   Collection Time: 07/30/23  5:31 PM   Specimen: BLOOD  Result Value Ref Range Status   Specimen Description BLOOD RIGHT ANTECUBITAL  Final   Special Requests   Final    BOTTLES DRAWN AEROBIC AND ANAEROBIC Blood Culture adequate volume   Culture   Final    NO GROWTH 2 DAYS Performed at [facility], [address], [place], [place] [address]    Report  Status PENDING  Incomplete  Culture, blood (routine x 2)     Status: None (Preliminary result)   Collection Time: 07/30/23  7:00 PM   Specimen: BLOOD  Result Value Ref Range Status   Specimen Description BLOOD BLOOD LEFT FOREARM  Final   Special Requests   Final    BOTTLES DRAWN AEROBIC AND ANAEROBIC Blood Culture results may not be optimal due to an inadequate volume of blood received in culture bottles   Culture   Final    NO GROWTH 2 DAYS Performed at [facility], [address], [place], [place] [address]    Report Status PENDING  Incomplete  Urine Culture     Status: Abnormal   Collection Time: 07/30/23  7:43 PM   Specimen: Urine, Random  Result Value Ref Range Status   Specimen Description   Final    URINE,  RANDOM Performed at [facility], [address], [place], [place] [address]    Special Requests   Final    NONE Reflexed from [id number] Performed at [facility], [address] [place], [place] [address]    Culture (A)  Final    <10,000 COLONIES/mL INSIGNIFICANT GROWTH Performed at [facility], [address], [place], [place] [address]    Report Status 08/01/2023 FINAL  Final    Coagulation Studies: No results for input(s): "LABPROT", "INR" in the last 72 hours.  Urinalysis: Recent Labs    07/30/23 1943  COLORURINE YELLOW*  LABSPEC 1.011  PHURINE 6.0  GLUCOSEU 50*  HGBUR NEGATIVE  BILIRUBINUR NEGATIVE  KETONESUR NEGATIVE  PROTEINUR 100*  NITRITE NEGATIVE  LEUKOCYTESUR LARGE*      Imaging: PERIPHERAL VASCULAR CATHETERIZATION  Result Date: 08/01/2023 See surgical note for result.  [place] ASCITES (ABDOMEN LIMITED)  Result Date: 07/31/2023 CLINICAL DATA:  [id number] Ascites [id number] EXAM: LIMITED ABDOMEN ULTRASOUND FOR ASCITES TECHNIQUE: Limited ultrasound survey for ascites was performed in all four abdominal quadrants. COMPARISON:  None Available. FINDINGS: Limited sonographic exam of the abdominal 4 quadrants was performed for fluid assessment. Images demonstrate trace free fluid, not amenable to safe image guided drainage. IMPRESSION: Trace free fluid noted in the abdomen. Fluid volume is insufficient for safe image guided drainage. Electronically Signed   By: Olive Bass M.D.   On: 07/31/2023 12:40   CT ABDOMEN PELVIS WO CONTRAST  Result Date: 07/30/2023 CLINICAL DATA:  Acute nonlocalized abdominal pain EXAM: CT ABDOMEN AND PELVIS WITHOUT CONTRAST TECHNIQUE: Multidetector CT imaging of the abdomen and pelvis was performed following the standard protocol without IV contrast. RADIATION DOSE REDUCTION: This exam was performed according to the departmental dose-optimization program which includes automated exposure control, adjustment of the mA and/or kV  according to patient size and/or use of iterative reconstruction technique. COMPARISON:  CT abdomen and pelvis 07/26/2023 FINDINGS: Lower chest: Bibasilar atelectasis/scarring. Hepatobiliary: Unremarkable liver. Normal gallbladder. No biliary dilation. Pancreas: Unremarkable. Spleen: Unremarkable. Adrenals/Urinary Tract: Normal adrenal glands. Similar mild-to-moderate right hydroureteronephrosis about the right ureteral stent. Periureteral fat stranding is similar. Unchanged mild ectasia of the left renal collecting system and ureter. Nonobstructing stone or parenchymal calcification lower pole of the right kidney. No ureteral stones. Unremarkable bladder. Stomach/Bowel: Normal caliber large and small bowel. Moderate to large stool burden in the right colon. No bowel wall thickening. The appendix measures 7 mm diameter. There is mild fluid and stranding about the appendix however this is favored due to mesenteric edema/peritoneum dialysis.Stomach is within normal limits. Vascular/Lymphatic: Aortic atherosclerosis. No enlarged abdominal or pelvic  lymph nodes. Reproductive: Unremarkable. Other: Peritoneal dialysis catheter. Mild mesenteric edema. Small volume free fluid about the liver, in the pelvis and pericolic gutters. Small locule of free air in the low central abdomen adjacent to the dialysis catheter. Similar skin thickening and mild stranding in the anterior abdominal wall. Musculoskeletal: No acute fracture. Advanced arthritis in the right-greater-than-left hips. IMPRESSION: 1. Similar mild-to-moderate right hydroureteronephrosis about the right ureteral stent with periureteral fat stranding. 2. Peritoneal dialysis catheter with small volume abdominopelvic ascites. 3. There is mild stranding and free fluid about the appendix which is normal in caliber. This is favored due to mesenteric edema and peritoneal dialysis. 4. Moderate to large stool burden in the right colon. Correlate for constipation. 5. Mild skin  thickening and stranding in the anterior abdominal wall. Correlate for cellulitis. Aortic Atherosclerosis (ICD10-I70.0). Electronically Signed   By: Minerva Fester M.D.   On: 07/30/2023 22:35     Medications:    sodium chloride     cefTRIAXone (ROCEPHIN)  IV 2 g (07/31/23 2100)   dialysis solution 1.5% low-MG/low-CA     dialysis solution 2.5% low-MG/low-CA      aspirin EC  81 mg Oral Daily   atorvastatin  20 mg Oral Daily   buPROPion  150 mg Oral Daily   busPIRone  5 mg Oral Daily   [START ON 08/02/2023] Chlorhexidine Gluconate Cloth  6 each Topical Daily   cloNIDine  0.1 mg Oral BID   fluconazole  100 mg Oral QHS   gentamicin cream  1 Application Topical Daily   heparin  5,000 Units Subcutaneous Q12H   insulin aspart  0-5 Units Subcutaneous QHS   insulin aspart  0-9 Units Subcutaneous TID WC   insulin aspart  3 Units Subcutaneous TID WC   insulin glargine-yfgn  15 Units Subcutaneous BID   lactulose  30 g Oral BID   metoprolol tartrate  100 mg Oral BID   patiromer  8.4 g Oral Daily   sodium bicarbonate  650 mg Oral BID   sodium chloride flush  3 mL Intravenous Q12H   sodium chloride flush  3 mL Intravenous Q12H   sodium chloride, acetaminophen **OR** acetaminophen, heparin sodium (porcine) 1,000 Units in dialysis solution 2.5% low-MG/low-CA 2,000 mL dialysis solution, morphine injection, sodium chloride flush  Assessment/ Plan:  Ms. CHEYNA ANGUS is a 77 y.o.  female with end stage renal disease on peritoneal dialysis, hypertension, GERD, gout, hyeprlipidemia, diabetes mellitus type II and vertigo who presents to [facility] on 07/30/2023 for Peritonitis (HCC) [K65.9] ESRD on peritoneal dialysis (HCC) [N18.6, Z99.2] Abdominal pain [R10.9]  End Stage Renal Disease: just started peritoneal dialysis training.  - Attempted PD catheter flush, fluid able to instill but does not return.  - Patient agreeable to proceed with back up hemodialysis. Vascular surgery to place permcath today - Will  consult general surgery to remove PD catheter.  - Will initiate hemodialysis tomorrow.   Abdominal pain: multifactorial: peritonitis vs constipation Unable to obtain sample. Patient now having flatus but no bowel movement. Will order lactulose twice daily until patient has BM.   Hypertension with chronic kidney disease: blood pressure currently at goal.  - Current regimen of clonidine and metoprolol. - Will monitor blood pressure   Diabetes mellitus type II with chronic kidney disease: insulin dependent. Novolog and Lantus outpatient.   Anemia with chronic kidney disease: macrocytic. Was getting ESA and IV iron as outpatient.  -Hgb 9.5, within desired range  6.  CT of the abdomen pelvis without  contrast shows mild to moderate right hydroureteronephrosis with right ureteral stent, mesenteric edema.  Urologist has evaluated the scan and the condition is deemed chronic but stable.  Stent was placed in February.  Due for change as per urology protocol.   LOS: 2   9/5/20242:41 PM

## 2023-08-01 NOTE — Assessment & Plan Note (Addendum)
Patient met sepsis criteria with fever and leukocytosis likely secondary to SBP. Malfunctioning PD catheter which was removed today by general surgery and patient was started on hemodialysis. Initial cultures with Enterococcus faecalis resistant to vancomycin -Started on p.o. amoxicillin 1 g twice daily for 3 weeks according to susceptibility result -Supportive care

## 2023-08-02 ENCOUNTER — Inpatient Hospital Stay: Payer: Medicare Other

## 2023-08-02 ENCOUNTER — Encounter: Payer: Self-pay | Admitting: Vascular Surgery

## 2023-08-02 ENCOUNTER — Encounter
Admission: EM | Disposition: A | Discharge status: Home Health | Payer: Self-pay | Source: Home / Self Care | Attending: Internal Medicine

## 2023-08-02 DIAGNOSIS — E875 Hyperkalemia: Secondary | ICD-10-CM | POA: Diagnosis not present

## 2023-08-02 DIAGNOSIS — T85611A Breakdown (mechanical) of intraperitoneal dialysis catheter, initial encounter: Secondary | ICD-10-CM

## 2023-08-02 DIAGNOSIS — Z992 Dependence on renal dialysis: Secondary | ICD-10-CM

## 2023-08-02 DIAGNOSIS — R1084 Generalized abdominal pain: Secondary | ICD-10-CM | POA: Diagnosis not present

## 2023-08-02 DIAGNOSIS — I10 Essential (primary) hypertension: Secondary | ICD-10-CM | POA: Diagnosis not present

## 2023-08-02 DIAGNOSIS — K652 Spontaneous bacterial peritonitis: Secondary | ICD-10-CM | POA: Diagnosis not present

## 2023-08-02 DIAGNOSIS — N186 End stage renal disease: Secondary | ICD-10-CM | POA: Diagnosis not present

## 2023-08-02 HISTORY — PX: CAPD REMOVAL: SHX5234

## 2023-08-02 LAB — GLUCOSE, CAPILLARY
Glucose-Capillary: 81 mg/dL (ref 70–99)
Glucose-Capillary: 100 mg/dL — ABNORMAL HIGH (ref 70–99)
Glucose-Capillary: 105 mg/dL — ABNORMAL HIGH (ref 70–99)
Glucose-Capillary: 213 mg/dL — ABNORMAL HIGH (ref 70–99)
Glucose-Capillary: 523 mg/dL (ref 70–99)
Glucose-Capillary: 529 mg/dL (ref 70–99)

## 2023-08-02 LAB — GLUCOSE, RANDOM: Glucose, Bld: 649 mg/dL (ref 70–99)

## 2023-08-02 LAB — HEPATITIS B SURFACE ANTIBODY, QUANTITATIVE: Hep B S AB Quant (Post): <3.5 m[IU]/mL — ABNORMAL LOW

## 2023-08-02 SURGERY — CONTINUOUS AMBULATORY PERITONEAL DIALYSIS  (CAPD) CATHETER REMOVAL
Anesthesia: General

## 2023-08-02 MED ORDER — ONDANSETRON HCL 4 MG/2ML IJ SOLN
INTRAMUSCULAR | Status: AC
  Filled 2023-08-02: qty 2

## 2023-08-02 MED ORDER — LIDOCAINE HCL (PF) 2 % IJ SOLN
INTRAMUSCULAR | Status: AC
  Filled 2023-08-02: qty 5

## 2023-08-02 MED ORDER — LIDOCAINE-EPINEPHRINE (PF) 1 %-1:200000 IJ SOLN
INTRAMUSCULAR | Status: DC | PRN
  Administered 2023-08-01: 20 mL

## 2023-08-02 MED ORDER — BUPIVACAINE-EPINEPHRINE (PF) 0.25% -1:200000 IJ SOLN
INTRAMUSCULAR | Status: AC
  Filled 2023-08-02: qty 30

## 2023-08-02 MED ORDER — BUPIVACAINE-EPINEPHRINE 0.25% -1:200000 IJ SOLN
INTRAMUSCULAR | Status: DC | PRN
  Administered 2023-08-02: 30 mL

## 2023-08-02 MED ORDER — PHENYLEPHRINE 80 MCG/ML (10ML) SYRINGE FOR IV PUSH (FOR BLOOD PRESSURE SUPPORT)
PREFILLED_SYRINGE | INTRAVENOUS | Status: AC
  Filled 2023-08-02: qty 20

## 2023-08-02 MED ORDER — EPHEDRINE 5 MG/ML INJ
INTRAVENOUS | Status: AC
  Filled 2023-08-02: qty 10

## 2023-08-02 MED ORDER — BUPIVACAINE LIPOSOME 1.3 % IJ SUSP
INTRAMUSCULAR | Status: AC
  Filled 2023-08-02: qty 20

## 2023-08-02 MED ORDER — EPHEDRINE SULFATE (PRESSORS) 50 MG/ML IJ SOLN
INTRAMUSCULAR | Status: DC | PRN
  Administered 2023-08-02 (×3): 5 mg via INTRAVENOUS
  Administered 2023-08-02: 10 mg via INTRAVENOUS

## 2023-08-02 MED ORDER — EPHEDRINE 5 MG/ML INJ
INTRAVENOUS | Status: AC
  Filled 2023-08-02: qty 5

## 2023-08-02 MED ORDER — SUGAMMADEX SODIUM 200 MG/2ML IV SOLN
INTRAVENOUS | Status: DC | PRN
  Administered 2023-08-02: 400 mg via INTRAVENOUS

## 2023-08-02 MED ORDER — PROPOFOL 10 MG/ML IV BOLUS
INTRAVENOUS | Status: DC | PRN
  Administered 2023-08-02: 150 mg via INTRAVENOUS

## 2023-08-02 MED ORDER — ROCURONIUM BROMIDE 10 MG/ML (PF) SYRINGE
PREFILLED_SYRINGE | INTRAVENOUS | Status: AC
  Filled 2023-08-02: qty 20

## 2023-08-02 MED ORDER — FENTANYL CITRATE (PF) 100 MCG/2ML IJ SOLN
INTRAMUSCULAR | Status: DC | PRN
  Administered 2023-08-02: 50 ug via INTRAVENOUS

## 2023-08-02 MED ORDER — ROCURONIUM BROMIDE 100 MG/10ML IV SOLN
INTRAVENOUS | Status: DC | PRN
  Administered 2023-08-02: 60 mg via INTRAVENOUS

## 2023-08-02 MED ORDER — AMOXICILLIN 500 MG PO CAPS
1000.0000 mg | ORAL_CAPSULE | Freq: Two times a day (BID) | ORAL | Status: DC
  Administered 2023-08-02 – 2023-08-04 (×5): 1000 mg via ORAL
  Filled 2023-08-02 (×7): qty 2

## 2023-08-02 MED ORDER — ONDANSETRON HCL 4 MG/2ML IJ SOLN
INTRAMUSCULAR | Status: DC | PRN
  Administered 2023-08-02: 4 mg via INTRAVENOUS

## 2023-08-02 MED ORDER — INSULIN ASPART 100 UNIT/ML IJ SOLN
15.0000 [IU] | Freq: Once | INTRAMUSCULAR | Status: AC
  Administered 2023-08-02: 15 [IU] via SUBCUTANEOUS
  Filled 2023-08-02: qty 1

## 2023-08-02 MED ORDER — HEPARIN (PORCINE) IN NACL 1000-0.9 UT/500ML-% IV SOLN
INTRAVENOUS | Status: DC | PRN
  Administered 2023-08-01: 500 mL

## 2023-08-02 MED ORDER — FENTANYL CITRATE (PF) 100 MCG/2ML IJ SOLN: 25.0000 ug | INTRAMUSCULAR | Status: DC | PRN

## 2023-08-02 MED ORDER — PROPOFOL 10 MG/ML IV BOLUS
INTRAVENOUS | Status: AC
  Filled 2023-08-02: qty 20

## 2023-08-02 MED ORDER — DEXAMETHASONE SODIUM PHOSPHATE 10 MG/ML IJ SOLN
INTRAMUSCULAR | Status: DC | PRN
  Administered 2023-08-02: 10 mg via INTRAVENOUS

## 2023-08-02 MED ORDER — OXYCODONE HCL 5 MG PO TABS: 5.0000 mg | ORAL_TABLET | Freq: Once | ORAL | Status: DC | PRN

## 2023-08-02 MED ORDER — PHENYLEPHRINE HCL (PRESSORS) 10 MG/ML IV SOLN
INTRAVENOUS | Status: DC | PRN
  Administered 2023-08-02: 160 ug via INTRAVENOUS
  Administered 2023-08-02: 80 ug via INTRAVENOUS
  Administered 2023-08-02 (×2): 160 ug via INTRAVENOUS
  Administered 2023-08-02: 240 ug via INTRAVENOUS

## 2023-08-02 MED ORDER — OXYCODONE HCL 5 MG/5ML PO SOLN: 5.0000 mg | Freq: Once | ORAL | Status: DC | PRN

## 2023-08-02 MED ORDER — LIDOCAINE HCL (CARDIAC) PF 100 MG/5ML IV SOSY
PREFILLED_SYRINGE | INTRAVENOUS | Status: DC | PRN
  Administered 2023-08-02: 80 mg via INTRAVENOUS

## 2023-08-02 MED ORDER — PHENYLEPHRINE HCL-NACL 20-0.9 MG/250ML-% IV SOLN
INTRAVENOUS | Status: DC | PRN
Start: 2023-08-02 — End: 2023-08-02
  Administered 2023-08-02: 35 ug/min via INTRAVENOUS

## 2023-08-02 MED ORDER — 0.9 % SODIUM CHLORIDE (POUR BTL) OPTIME
TOPICAL | Status: DC | PRN
  Administered 2023-08-02: 500 mL

## 2023-08-02 MED ORDER — FENTANYL CITRATE (PF) 100 MCG/2ML IJ SOLN
INTRAMUSCULAR | Status: AC
  Filled 2023-08-02: qty 2

## 2023-08-02 MED ORDER — DEXAMETHASONE SODIUM PHOSPHATE 10 MG/ML IJ SOLN
INTRAMUSCULAR | Status: AC
  Filled 2023-08-02: qty 1

## 2023-08-02 MED ORDER — ALTEPLASE 2 MG IJ SOLR: 2.0000 mg | Freq: Once | INTRAMUSCULAR | Status: DC | PRN

## 2023-08-02 SURGICAL SUPPLY — 44 items
ADH SKN CLS APL DERMABOND .7 (GAUZE/BANDAGES/DRESSINGS) ×1
APL PRP STRL LF DISP 70% ISPRP (MISCELLANEOUS) ×1
BLADE CLIPPER SURG (BLADE) IMPLANT
CHLORAPREP W/TINT 26 (MISCELLANEOUS) ×1 IMPLANT
DERMABOND ADVANCED .7 DNX12 (GAUZE/BANDAGES/DRESSINGS) ×1 IMPLANT
ELECT CAUTERY BLADE TIP 2.5 (TIP) ×1
ELECT REM PT RETURN 9FT ADLT (ELECTROSURGICAL) ×1
ELECTRODE CAUTERY BLDE TIP 2.5 (TIP) ×1 IMPLANT
ELECTRODE REM PT RTRN 9FT ADLT (ELECTROSURGICAL) ×1 IMPLANT
GLOVE BIO SURGEON STRL SZ7 (GLOVE) ×1 IMPLANT
GOWN STRL REUS W/ TWL LRG LVL3 (GOWN DISPOSABLE) ×2 IMPLANT
GOWN STRL REUS W/TWL LRG LVL3 (GOWN DISPOSABLE) ×2
GRASPER SUT TROCAR 14GX15 (MISCELLANEOUS) ×1 IMPLANT
KIT TURNOVER KIT A (KITS) ×1 IMPLANT
MANIFOLD NEPTUNE II (INSTRUMENTS) ×1 IMPLANT
NDL HYPO 22X1.5 SAFETY MO (MISCELLANEOUS) ×1 IMPLANT
NDL INSUFFLATION 14GA 120MM (NEEDLE) ×1 IMPLANT
NDL SAFETY ECLIP 18X1.5 (MISCELLANEOUS) ×1 IMPLANT
NEEDLE HYPO 22X1.5 SAFETY MO (MISCELLANEOUS) ×1 IMPLANT
NEEDLE INSUFFLATION 14GA 120MM (NEEDLE) ×1 IMPLANT
NS IRRIG 500ML POUR BTL (IV SOLUTION) ×1 IMPLANT
PACK LAP CHOLECYSTECTOMY (MISCELLANEOUS) ×1 IMPLANT
PENCIL SMOKE EVACUATOR (MISCELLANEOUS) ×1 IMPLANT
SET TUBE SMOKE EVAC HIGH FLOW (TUBING) ×1 IMPLANT
SLEEVE ADV FIXATION 5X100MM (TROCAR) ×1 IMPLANT
SPONGE T-LAP 18X18 ~~LOC~~+RFID (SPONGE) ×1 IMPLANT
SUT ETHILON 3-0 FS-10 30 BLK (SUTURE)
SUT MNCRL 4-0 (SUTURE) ×1
SUT MNCRL 4-0 27XMFL (SUTURE) ×1
SUT VIC AB 2-0 SH 27 (SUTURE) ×1
SUT VIC AB 2-0 SH 27XBRD (SUTURE) IMPLANT
SUT VIC AB 3-0 SH 27 (SUTURE) ×1
SUT VIC AB 3-0 SH 27X BRD (SUTURE) ×1 IMPLANT
SUT VICRYL 0 UR6 27IN ABS (SUTURE) ×2 IMPLANT
SUTURE EHLN 3-0 FS-10 30 BLK (SUTURE) IMPLANT
SUTURE MNCRL 4-0 27XMF (SUTURE) ×1 IMPLANT
SYR 20ML LL LF (SYRINGE) ×1 IMPLANT
SYR 3ML LL SCALE MARK (SYRINGE) ×1 IMPLANT
SYS KII FIOS ACCESS ABD 5X100 (TROCAR) ×1
SYSTEM KII FIOS ACES ABD 5X100 (TROCAR) ×1 IMPLANT
TRAP FLUID SMOKE EVACUATOR (MISCELLANEOUS) ×1 IMPLANT
TROCAR BALLN GELPORT 12X130M (ENDOMECHANICALS) IMPLANT
TROCAR Z-THREAD FIOS 5X100MM (TROCAR) ×1 IMPLANT
WATER STERILE IRR 500ML POUR (IV SOLUTION) ×1 IMPLANT

## 2023-08-02 NOTE — Progress Notes (Signed)
Progress Note   Patient: Heather Lopez ZOX:[id number] DOB: Jun 26, 1946 DOA: 07/30/2023     3 DOS: the patient was seen and examined on 08/02/2023   Brief hospital course: Taken from H&P.   GERILYNN ARRENDONDO is a 77 y.o. female with medical history significant for end-stage renal disease on peritoneal dialysis, hypertension, gout, GERD, perforated duodenal ulcer, diabetes mellitus type 2, patient coming to [place] for abdominal pain that is been going on for about 7 days.  She was at her nephrology appointment today and had abdominal pain and peritoneal fluid collected was positive for it concerning for for peritonitis and patient was referred to the emergency room.  On initial presentation patient has mildly elevated blood pressure, afebrile but later developed fever at 101.9.  Labs pertinent for leukocytosis at 14, glucose of 232, renal function consistent with ESRD. CT abdomen and pelvis with no acute abnormalities, similar mild to moderate right hydronephrosis about the right ureteral stent with periureteral fat stranding.  Small volume abdominopelvic ascites with PD catheter in place. Moderate to large stool burden in the right colon and mild skin thickening and stranding in the anterior abdominal wall, correlate for cellulitis.  Patient was started on cefepime and vancomycin.  9/4: Some improvement in leukocytosis.  Apparently dialysis nurse was unable to drain dialysis fluid due to increased thickness despite using tPA.  IR was consulted for drainage, repeat ultrasound with no significant amount of fluid which can be drained by IR.  Mild hyperkalemia at 5.3-starting Veltassa Significant stool burden which can be contributory to her symptoms-giving soapsuds enema.  Nephrology would like to continue with broad-spectrum antibiotics for now.  9/5: Permanent HD catheter was placed by vascular surgery today.  PD catheter to be removed by general surgery likely tomorrow.  Hemodialysis will be started from  tomorrow. Patient need to follow-up with outpatient urology regarding chronic hydronephrosis which seems stable, patient had stents placed in February and will need either exchange or removal.  9/6: Cultures from peritoneal fluid drawn at nephrology office with Enterococcus faecalis resistant to vancomycin.  Antibiotics switched with amoxicillin at 1 g twice daily p.o. for 3 weeks.  PD catheter was removed by general surgery.  Need outpatient dialysis chair before discharge.   Assessment and Plan: * SBP (spontaneous bacterial peritonitis) (HCC) Patient met sepsis criteria with fever and leukocytosis likely secondary to SBP. Malfunctioning PD catheter which was removed today by general surgery and patient was started on hemodialysis. Initial cultures with Enterococcus faecalis resistant to vancomycin -Started on p.o. amoxicillin 1 g twice daily for 3 weeks according to susceptibility result -Supportive care  Abdominal pain PRN morphine.  Suspect 2/2 PD catheter dysfunction and SBP Ct ABD negative for perforation or colitis.   Hyperkalemia Resolved with Veltassa. -Continue to monitor  ESRD on peritoneal dialysis [facility]) Patient was on peritoneal dialysis, malfunctioning PD catheter so a permanent HD catheter was placed  by vascular surgery.  PD catheter removed -Patient to start on hemodialysis -Nephrology is on board -Will need outpatient chair before discharge  T2DM (type 2 diabetes mellitus) (HCC) CBG elevated.  Patient was on Trulicity, 60 units of Lantus and NovoLog at home. -Semglee 15 units twice daily -SSI plus mealtime coverage   Essential hypertension Continue patient on metoprolol and clonidine.  Decubitus ulcer of sacral region, stage 1 Stage stage I bilateral erythema on both the buttocks.  No wound documented and reported by patient's nurse.   Wound care skin care.  Barrier protection.  Obesity (BMI 30-39.9)  Estimated body mass index is 38.97 kg/m as calculated  from the following:   Height as of this encounter: 5\' 3"  (1.6 m).   Weight as of this encounter: 99.8 kg.   -This will complicate overall prognosis      Subjective: Patient was seen before the surgery for removal of PD catheter.  Abdominal pain improving.  Physical Exam: Vitals:   08/02/23 0946 08/02/23 1205 08/02/23 1215 08/02/23 1230  BP: (!) 156/70 (!) 137/50 (!) 128/58 133/66  Pulse: 70 80 81 71  Resp: 16 16 18 17   Temp: 98.7 F (37.1 C) 98.7 F (37.1 C)  98.7 F (37.1 C)  TempSrc: Temporal     SpO2: 93% 96% 95% 95%  Weight:      Height:       General.  Obese lady, in no acute distress. Pulmonary.  Lungs clear bilaterally, normal respiratory effort. CV.  Regular rate and rhythm, no JVD, rub or murmur. Abdomen.  Soft, nontender, mildly distended, BS positive. CNS.  Alert and oriented .  No focal neurologic deficit. Extremities.  No edema, no cyanosis, pulses intact and symmetrical. Psychiatry.  Judgment and insight appears normal.   Data Reviewed: Prior data reviewed  Family Communication: Discussed with husband at bedside  Disposition: Status is: Inpatient Remains inpatient appropriate because: Severity of illness  Planned Discharge Destination: Home  DVT prophylaxis.  Subcu heparin Time spent: 44 minutes  This record has been created using Conservation officer, historic buildings. Errors have been sought and corrected,but may not always be located. Such creation errors do not reflect on the standard of care.   Author: Arnetha Courser, MD 08/02/2023 2:03 PM  For on call review [email or web address].

## 2023-08-02 NOTE — Op Note (Signed)
Removal  of peritoneal Dialysis catheter ( Total three cuffs)   Pre-operative Diagnosis: ESRD, non functional PD cath   Post-operative Diagnosis: same     Surgeon: Sterling Big, MD FACS   Anesthesia: Gen. with endotracheal tube      Findings:Non functional PD cathter w intraluminal debris Laparoscopy was not required   Estimated Blood Loss: 5cc              Complications: none     Procedure Details  The patient was seen again in the Holding Room. The benefits, complications, treatment options, and expected outcomes were discussed with the patient. The risks of bleeding, infection, recurrence of symptoms, failure to resolve symptoms, bowel injury, any of which could require further surgery were reviewed with the patient. The likelihood of improving the patient's symptoms with return to their baseline status is good.  The patient and/or family concurred with the proposed plan, giving informed consent.  The patient was taken to Operating Room, identified and the procedure verified. A Time Out was held and the above information confirmed.   Prior to the induction of general anesthesia, antibiotic prophylaxis was administered. VTE prophylaxis was in place. General endotracheal anesthesia was then administered and tolerated well. After the induction, the abdomen was prepped with Chloraprep and draped in the sterile fashion. The patient was positioned in the supine position.   Periumbilical incision created to the Right of the midline following prior scar. Sub q was dissected and the cuff was visualized and dissected free from the sub q. I did not see any attachments to the fascia or any stitches. NO evidence of overt infection. I was able to pulled the distal end and also visualized the titanium connector that was intact. I performed another incision on the upper quadrant to locate the two additional cuffs. The two cuffs were dissected free from sub q tissue. The catheter was removed as a whole  and intact.  All skin incisions  were infiltrated with a liposomal Marcaine. Dermal defects closed w 2-0 vicryl and the skin incisions were closed using 4-0 subcuticular Monocryl . Dermabond was applied to the skin incisions.  At the exit site given chronic inflammation I left the wound open and placed a bandaid. The patient was then extubated and brought to the recovery room in stable condition. Sponge, lap, and needle counts were correct at closure and at the conclusion of the case.               Sterling Big, MD, FACS

## 2023-08-02 NOTE — Care Management Important Message (Signed)
Important Message  Patient Details  Name: Heather Lopez MRN: [id number] Date of Birth: [birth year]   Medicare Important Message Given:  N/A - LOS <3 / Initial given by admissions  Patient out of room upon time of visit, no family in room.  Copy of Medicare IM left on bedside tray for reference.   Johnell Comings 08/02/2023, 10:19 AM

## 2023-08-02 NOTE — Consult Note (Signed)
[facility] SUR[facility]ICAL CONSULTATION NOTE (initial) - cpt: 99254   HISTORY OF PRESENT ILLNESS (HPI):  77 y.o. female presented to [facility] ED on 09/03 for evaluation of malfunctioning PD catheter. Patient presenting to peritoneal dialysis and had fluid instilled and drawn off her PD catheter and sent for Cx. She was then called and informed there was concern for infection and was referred to ED. Patient at that time complaining of around 7 days of abdominal pain. No fever, chills, cough, CP, SOB, or emesis. She was ultimately admitted to the medicine service over concerns of SBP. Started on cefepime and vancomycin. Bcx were negative. Nephrology of course on board. PD catheter continued to have issue with draining. Vascular surgery was consulted and placed perm-cath on 09/05 for HD. Surgery asked to remove PD catheter. Of note, PD catheter initially placed with Dr Buzzy Han on 06/28/2023. She is also s/p laparotomy and graham patch repair of perforated duodenal ulcer in August 2022 with Dr Aleen Campi.   Surgery is consulted by nephrology provider Wendee Beavers, NP in this context for evaluation for removal of PD catheter.  PAST MEDICAL HISTORY (PMH):  Past Medical History:  Diagnosis Date   Acute renal failure (HCC) 06/27/2021   Anxiety    Aortic atherosclerosis (HCC)    Cataract    Depression    Dysrhythmia    GERD (gastroesophageal reflux disease)    Gout    History of kidney stones    Hyperlipidemia    Hypertension    Nausea & vomiting 12/02/2021   Obesity    Obstruction of left ureteropelvic junction (UPJ) due to stone 06/2021   Osteoarthritis    Perforated duodenal ulcer (HCC) 06/27/2021   Pyonephrosis 06/2021   left   Septic shock (HCC) 06/27/2021   Type 2 diabetes mellitus with stage 5 chronic kidney disease (HCC)    Vertigo      PAST SURGICAL HISTORY (PSH):  Past Surgical History:  Procedure Laterality Date   CATARACT EXTRACTION, BILATERAL Bilateral 2019    COLONOSCOPY WITH PROPOFOL N/A 08/19/2015   Procedure: COLONOSCOPY WITH PROPOFOL;  Surgeon: Midge Minium, MD;  Location: [facility];  Service: Endoscopy;  Laterality: N/A;  diabetic - insulin   CYSTOSCOPY W/ RETROGRADES Bilateral 12/22/2019   Procedure: CYSTOSCOPY WITH RETROGRADE PYELOGRAM;  Surgeon: Riki Altes, MD;  Location: [facility];  Service: Urology;  Laterality: Bilateral;   CYSTOSCOPY W/ URETERAL STENT PLACEMENT Bilateral 07/05/2021   Procedure: CYSTOSCOPY WITH RETROGRADE PYELOGRAM/BILATERAL URETERAL STENT PLACEMENT;  Surgeon: Riki Altes, MD;  Location: [facility];  Service: Urology;  Laterality: Bilateral;   CYSTOSCOPY W/ URETERAL STENT PLACEMENT Bilateral 12/26/2021   Procedure: CYSTOSCOPY WITH STENT REMOVAL;  Surgeon: Riki Altes, MD;  Location: [facility];  Service: Urology;  Laterality: Bilateral;   CYSTOSCOPY W/ URETERAL STENT PLACEMENT Right 03/13/2022   Procedure: CYSTOSCOPY WITH RETROGRADE PYELOGRAM/URETERAL STENT PLACEMENT;  Surgeon: Riki Altes, MD;  Location: [facility];  Service: Urology;  Laterality: Right;   CYSTOSCOPY W/ URETERAL STENT PLACEMENT Right 01/01/2023   Procedure: CYSTOSCOPY WITH STENT EXCHANGE;  Surgeon: Riki Altes, MD;  Location: [facility];  Service: Urology;  Laterality: Right;   CYSTOSCOPY W/ URETERAL STENT REMOVAL Right 05/08/2022   Procedure: CYSTOSCOPY WITH STENT REMOVAL;  Surgeon: Riki Altes, MD;  Location: [facility];  Service: Urology;  Laterality: Right;   CYSTOSCOPY WITH BIOPSY N/A 12/22/2019   Procedure: CYSTOSCOPY WITH bladder BIOPSY;  Surgeon: Riki Altes, MD;  Location: [facility];  Service: Urology;  Laterality: N/A;   CYSTOSCOPY WITH BIOPSY  03/13/2022   Procedure: CYSTOSCOPY WITH BLADDER BIOPSY;  Surgeon: Riki Altes, MD;  Location: [facility];  Service: Urology;;   CYSTOSCOPY WITH STENT PLACEMENT Right 12/22/2019   Procedure: CYSTOSCOPY WITH STENT PLACEMENT;  Surgeon: Riki Altes, MD;  Location: [facility];   Service: Urology;  Laterality: Right;   CYSTOSCOPY WITH STENT PLACEMENT Right 07/03/2022   Procedure: CYSTOSCOPY/RIGHT URETEROSCOPY WITH STENT PLACEMENT;  Surgeon: Riki Altes, MD;  Location: [facility];  Service: Urology;  Laterality: Right;   CYSTOSCOPY WITH URETEROSCOPY Bilateral 12/22/2019   Procedure: CYSTOSCOPY WITH URETEROSCOPY;  Surgeon: Riki Altes, MD;  Location: [facility];  Service: Urology;  Laterality: Bilateral;   CYSTOSCOPY/URETEROSCOPY/HOLMIUM LASER/STENT PLACEMENT Right 12/22/2019   Procedure: CYSTOSCOPY/URETEROSCOPY/HOLMIUM LASER/STENT PLACEMENT;  Surgeon: Riki Altes, MD;  Location: [facility];  Service: Urology;  Laterality: Right;   CYSTOSCOPY/URETEROSCOPY/HOLMIUM LASER/STENT PLACEMENT Bilateral 12/08/2021   Procedure: CYSTOSCOPY/URETEROSCOPY/HOLMIUM LASER/STENT PLACEMENT;  Surgeon: Sondra Come, MD;  Location: [facility];  Service: Urology;  Laterality: Bilateral;   DIALYSIS/PERMA CATHETER INSERTION N/A 06/29/2021   Procedure: DIALYSIS/PERMA CATHETER INSERTION;  Surgeon: Annice Needy, MD;  Location: [facility];  Service: Cardiovascular;  Laterality: N/A;   DIALYSIS/PERMA CATHETER INSERTION N/A 07/13/2021   Procedure: DIALYSIS/PERMA CATHETER INSERTION;  Surgeon: Annice Needy, MD;  Location: [facility];  Service: Cardiovascular;  Laterality: N/A;   DIALYSIS/PERMA CATHETER INSERTION N/A 08/01/2023   Procedure: DIALYSIS/PERMA CATHETER INSERTION;  Surgeon: Annice Needy, MD;  Location: [facility];  Service: Cardiovascular;  Laterality: N/A;   EYE SURGERY     IR GASTROSTOMY TUBE MOD SED  08/01/2021   LAPAROTOMY N/A 06/29/2021   Procedure: EXPLORATORY LAPAROTOMY WITH REPAIR OF DUODENAL PERFORATION;  Surgeon: Henrene Dodge, MD;  Location: [facility];  Service: General;  Laterality: N/A;   POLYPECTOMY  08/19/2015   Procedure: POLYPECTOMY;  Surgeon: Midge Minium, MD;  Location: [facility];  Service: Endoscopy;;   TUBAL LIGATION  1992      MEDICATIONS:  Prior to Admission medications   Medication Sig Start Date End Date Taking? Authorizing Provider  aspirin 81 MG EC tablet Take 81 mg by mouth daily.   Yes [provider]  atorvastatin (LIPITOR) 20 MG tablet Take 20 mg by mouth daily.   Yes [provider]  buPROPion (WELLBUTRIN XL) 150 MG 24 hr tablet Take 150 mg by mouth daily.   Yes [provider]  busPIRone (BUSPAR) 5 MG tablet Take 5 mg by mouth daily.   Yes [provider]  cephALEXin (KEFLEX) 500 MG capsule Take 1 capsule (500 mg total) by mouth 2 (two) times daily. 07/26/23  Yes Minna Antis, MD  cloNIDine (CATAPRES) 0.1 MG tablet Take 1 tablet (0.1 mg total) by mouth 2 (two) times daily. 12/11/21  Yes Enedina Finner, MD  ferrous sulfate 325 (65 FE) MG tablet Take 325 mg by mouth daily with breakfast.   Yes [provider]  insulin aspart (NOVOLOG) 100 UNIT/ML injection Inject 10 Units into the skin 3 (three) times daily before meals.   Yes [provider]  insulin glargine (LANTUS) 100 UNIT/ML injection Inject 60 Units into the skin daily.   Yes [provider]  lansoprazole (PREVACID) 30 MG capsule Take 30 mg by mouth at bedtime.   Yes [provider]  loperamide (IMODIUM A-D) 2 MG tablet Take 2 mg by mouth every 6 (six) hours as needed for diarrhea or loose stools.  Yes [provider]  metoprolol tartrate (LOPRESSOR) 100 MG tablet Take 1 tablet (100 mg total) by mouth 2 (two) times daily. 12/11/21  Yes Enedina Finner, MD  Multiple Vitamin (MULTIVITAMIN WITH MINERALS) TABS tablet Take 1 tablet by mouth daily. 12/12/21  Yes Enedina Finner, MD  ondansetron (ZOFRAN) 4 MG tablet Take 4 mg by mouth every 8 (eight) hours as needed. 06/28/23  Yes [provider]  polyethylene glycol (MIRALAX / GLYCOLAX) 17 g packet Take 17 g by mouth daily as needed for mild constipation or moderate constipation.   Yes [provider]  senna-docusate  (SENOKOT-S) 8.6-50 MG tablet Take 1 tablet by mouth at bedtime as needed for mild constipation or moderate constipation.   Yes [provider]  sodium bicarbonate 650 MG tablet Take 650 mg by mouth 2 (two) times daily.   Yes [provider]  trospium (SANCTURA) 20 MG tablet Take 1 tablet (20 mg total) by mouth 2 (two) times daily. 04/18/23 04/12/24 Yes McGowan, Carollee Herter A, PA-C  VELTASSA 8.4 g packet Take 8.4 g by mouth daily. MIX 1 PACKET IN 1/3 CUP OF WATER AND DRINK BY MOUTH DAILY. 06/19/22  Yes [provider]  conjugated estrogens (PREMARIN) vaginal cream Apply one pea-sized amount around the opening of the urethra daily for 2 weeks, then 3 times weekly moving forward. Patient not taking: Reported on 07/31/2023 06/07/22   Carman Ching, PA-C  Dulaglutide (TRULICITY) 1.5 MG/0.5ML SOPN Inject 1.5 mg into the skin once a week. Sunday Patient not taking: Reported on 07/31/2023    [provider]     ALLERGIES:  Allergies  Allergen Reactions   Contrast Media  [Iodinated Contrast Media] Anaphylaxis   Iodine Swelling    (IV only) - angioedema     SOCIAL HISTORY:  Social History   Socioeconomic History   Marital status: Married    Spouse name: [name]   Number of children: Not on file   Years of education: Not on file   Highest education level: Not on file  Occupational History   Not on file  Tobacco Use   Smoking status: Former    Current packs/day: 0.00    Average packs/day: 0.8 packs/day for 6.0 years (4.5 ttl pk-yrs)    Types: Cigarettes    Start date: 76    Quit date: 81    Years since quitting: 51.7   Smokeless tobacco: Never   Tobacco comments:    quit 1973  Vaping Use   Vaping status: Never Used  Substance and Sexual Activity   Alcohol use: No    Alcohol/week: 0.0 standard drinks of alcohol   Drug use: No   Sexual activity: Not on file  Other Topics Concern   Not on file  Social History Narrative   Not on file   Social  Determinants of Health   Financial Resource Strain: Low Risk  (07/14/2023)   [facility]   Overall Financial Resource Strain (CARDIA)    Difficulty of Paying Living Expenses: Not very hard  Food Insecurity: No Food Insecurity (07/31/2023)   Hunger Vital Sign    Worried About Running Out of Food in the Last Year: Never true    Ran Out of Food in the Last Year: Never true  Transportation Needs: No Transportation Needs (07/31/2023)   PRAPARE - Administrator, Civil Service (Medical): No    Lack of Transportation (Non-Medical): No  Physical Activity: Unknown (07/14/2023)   Received from [facility]  Exercise Vital Sign    Days of Exercise per Week: 0 days    Minutes of Exercise per Session: Not on file  Stress: No Stress Concern Present (07/14/2023)   Received from [facility] - Occupational Stress Questionnaire    Feeling of Stress : Not at all  Social Connections: Socially Integrated (07/14/2023)   Received from [facility]   Social Network    How would you rate your social network (family, work, friends)?: Good participation with social networks  Intimate Partner Violence: Not At Risk (07/31/2023)   Humiliation, Afraid, Rape, and Kick questionnaire    Fear of Current or Ex-Partner: No    Emotionally Abused: No    Physically Abused: No    Sexually Abused: No     FAMILY HISTORY:  Family History  Problem Relation Age of Onset   Diabetes Father    Cancer Father        lung cancer   Diabetes Brother    Stroke Mother       REVIEW OF SYSTEMS:  Review of Systems  Constitutional:  Negative for chills and fever.  Respiratory:  Negative for cough and shortness of breath.   Cardiovascular:  Negative for chest pain and palpitations.  Gastrointestinal:  Positive for abdominal pain. Negative for nausea and vomiting.  Genitourinary:  Negative for dysuria and urgency.  All other systems reviewed and are  negative.   VITAL SIGNS:  Temp:  [98.3 F (36.8 C)-100.7 F (38.2 C)] 99.1 F (37.3 C) (09/06 0349) Pulse Rate:  [62-78] 62 (09/06 0349) Resp:  [10-26] 18 (09/06 0349) BP: (148-172)/(62-93) 162/72 (09/06 0349) SpO2:  [92 %-99 %] 92 % (09/06 0349)     Height: 5\' 3"  (160 cm) Weight: 101.3 kg BMI (Calculated): 39.57   INTAKE/OUTPUT:  No intake/output data recorded.  PHYSICAL EXAM:  Physical Exam Vitals and nursing note reviewed.  Constitutional:      General: She is not in acute distress.    Appearance: Normal appearance. She is obese. She is not ill-appearing.     Comments: Patient resting in bed; NAD. Husband at bedside   Eyes:     General: No scleral icterus.    Conjunctiva/sclera: Conjunctivae normal.  Neck:     Comments: Perm-cath in right neck; site CDI Pulmonary:     Effort: Pulmonary effort is normal. No respiratory distress.  Abdominal:     General: Abdomen is flat. A surgical scar is present. There is no distension.     Palpations: Abdomen is soft.     Tenderness: There is no abdominal tenderness. There is no guarding or rebound.     Comments: Abdomen is obese, soft, non-tender, non-distended, no rebound/guarding. PD catheter in right abdomen. Laparoscopic incisions are well healed   Genitourinary:    Comments: Deferred Skin:    General: Skin is warm and dry.     Coloration: Skin is not jaundiced.     Findings: No erythema.  Neurological:     General: No focal deficit present.     Mental Status: She is alert and oriented to person, place, and time.  Psychiatric:        Mood and Affect: Mood normal.        Behavior: Behavior normal.      Labs:     Latest Ref Rng & Units 08/01/2023    5:24 AM 07/31/2023    5:38 AM 07/30/2023    5:31 PM  CBC  WBC 4.0 -  10.5 K/uL 9.0  12.4  14.0   Hemoglobin 12.0 - 15.0 g/dL 9.5  16.1  09.6   Hematocrit 36.0 - 46.0 % 29.2  31.6  37.7   Platelets 150 - 400 K/uL 333  336  408       Latest Ref Rng & Units 08/01/2023    5:24 AM  07/31/2023    5:38 AM 07/30/2023    5:31 PM  CMP  Glucose 70 - 99 mg/dL 045  409  811   BUN 8 - 23 mg/dL 64  67  63   Creatinine 0.44 - 1.00 mg/dL 9.14  7.82  9.56   Sodium 135 - 145 mmol/L 131  133  133   Potassium 3.5 - 5.1 mmol/L 4.6  5.3  4.8   Chloride 98 - 111 mmol/L 100  102  99   CO2 22 - 32 mmol/L 19  21  20    Calcium 8.9 - 10.3 mg/dL 8.7  8.7  9.2   Total Protein 6.5 - 8.1 g/dL  7.0  7.8   Total Bilirubin 0.3 - 1.2 mg/dL  0.7  0.6   Alkaline Phos 38 - 126 U/L  94  103   AST 15 - 41 U/L  14  16   ALT 0 - 44 U/L  20  26      Imaging studies:   CT Abdomen/Pelvis (07/30/2023) personally reviewed noted ascites likely related to PD, PD catheter noted, tip amongst loops of bowel, no free air, and radiologist report reviewed below: IMPRESSION: 1. Similar mild-to-moderate right hydroureteronephrosis about the right ureteral stent with periureteral fat stranding. 2. Peritoneal dialysis catheter with small volume abdominopelvic ascites. 3. There is mild stranding and free fluid about the appendix which is normal in caliber. This is favored due to mesenteric edema and peritoneal dialysis. 4. Moderate to large stool burden in the right colon. Correlate for constipation. 5. Mild skin thickening and stranding in the anterior abdominal wall. Correlate for cellulitis.   Aortic Atherosclerosis (ICD10-I70.0).   Assessment/Plan: (ICD-10's: K110.2) 77 y.o. female with possible SBP and PD catheter malfunction, complicated by pertinent comorbidities including ESRD.   - Plan for removal of PD catheter this morning with Dr Everlene Farrier pending OR/Anesthesia availability  - All risks, benefits, and alternatives to above procedure(s) were discussed with the patient and her family (husband at bedside), all of their questions were answered to their expressed satisfaction, patient expresses she wishes to proceed, and informed consent was obtained.   - NPO for surgery  - Continue IV Abx (Rocephin) -  Monitor abdominal examination - Pain control prn; antiemetics prn   - Appreciate vascular assistance - Appreciate nephrology assistance - Further management per primary service   All of the above findings and recommendations were discussed with the patient and her family (husband at bedside), and all of patient's and her family's questions were answered to their expressed satisfaction.  Thank you for the opportunity to participate in this patient's care.   -- Lynden Oxford, PA-C  [facility] 08/02/2023, 7:58 AM M-F: 7am - 4pm

## 2023-08-02 NOTE — Anesthesia Postprocedure Evaluation (Signed)
Anesthesia Post Note  Patient: CYNITHA OBERHOLTZER  Procedure(s) Performed: CONTINUOUS AMBULATORY PERITONEAL DIALYSIS  (CAPD) CATHETER REMOVAL  Patient location during evaluation: PACU Anesthesia Type: General Level of consciousness: awake and alert Pain management: pain level controlled Vital Signs Assessment: post-procedure vital signs reviewed and stable Respiratory status: spontaneous breathing, nonlabored ventilation, respiratory function stable and patient connected to nasal cannula oxygen Cardiovascular status: blood pressure returned to baseline and stable Postop Assessment: no apparent nausea or vomiting Anesthetic complications: no  No notable events documented.   Last Vitals:  Vitals:   08/02/23 1215 08/02/23 1230  BP: (!) 128/58 133/66  Pulse: 81 71  Resp: 18 17  Temp:  37.1 C  SpO2: 95% 95%    Last Pain:  Vitals:   08/02/23 1230  TempSrc:   PainSc: 0-No pain                 Stephanie Coup

## 2023-08-02 NOTE — Progress Notes (Signed)
       CROSS COVER NOTE  NAME: KARALYNN HASSE MRN: [id number] DOB : 09-25-1946    Concern as stated by nurse / staff   HIRAL DER is a 77 y.o. female with medical history significant for end-stage renal disease on peritoneal dialysis, hypertension, gout, GERD, perforated duodenal ulcer, diabetes mellitus type 2, patient coming to [facility] for abdominal pain that is been going on for about 7 days      Pertinent findings on chart review:   Assessment and  Interventions   Assessment: Venous glucose 549 Plan: Novolog 15 units x1 Recheck cbg 0300       Donnie Mesa NP [facility] Cross Cover 7pm-7am - check amion for availability Pager [phone]

## 2023-08-02 NOTE — Progress Notes (Signed)
Pharmacy Antibiotic Note  Heather Lopez is a 77 y.o. female admitted on 07/30/2023 with  SBP peritonitis .  PMH includes peritoneal dialysis. Pharmacy has been consulted for vancomycin dosing.   Plan: Patient received LD of Vancomycin 2500 mg IV on 9/4 @ 10am Also on  Ceftriaxone and fluconazole per MD orders Plan to obtain vancomycin random level tomorrow 9/8 woth AM labs (3-4 days after LD) Re-dose once level <20 mcg/mL. This interval will determine the dosing frequency. As per [facility] Antimicrobial Dosing guidelines handbook  Height: 5\' 3"  (160 cm) Weight: 101.3 kg (223 lb 5.2 oz) IBW/kg (Calculated) : 52.4  Temp (24hrs), Avg:99.1 F (37.3 C), Min:98.3 F (36.8 C), Max:100.7 F (38.2 C)  Recent Labs  Lab 07/26/23 1233 07/30/23 1731 07/31/23 0538 08/01/23 0524  WBC 5.6 14.0* 12.4* 9.0  CREATININE 5.79* 5.38* 5.53* 5.12*  LATICACIDVEN  --  1.8  --   --     Estimated Creatinine Clearance: 10.6 mL/min (A) (by C-G formula based on SCr of 5.12 mg/dL (H)).    Allergies  Allergen Reactions   Contrast Media  [Iodinated Contrast Media] Anaphylaxis   Iodine Swelling    (IV only) - angioedema    Antimicrobials this admission: Cefepime 9/3 x1   Ceftriaxone 9/4  >>   Fluconazole 9/3>> Vancomycin 9/3>>  Dose adjustments this admission:n/a   Microbiology results: 9/3 BCx: NG x 2 Days 9/3 UCx: insignificant growth  Thank you for allowing pharmacy to be a part of this patient's care.  Evadean Sproule Rodriguez-Guzman PharmD, BCPS 08/02/2023 10:08 AM

## 2023-08-02 NOTE — Progress Notes (Signed)
[facility]  ROUNDING NOTE   Subjective:   [name] was admitted to [facility] on 07/30/2023 for Peritonitis [facility]) [K65.9] ESRD on peritoneal dialysis (HCC) [N18.6, Z99.2] Abdominal pain [R10.9]  Patient was sent to ED from [facility] due to a malfunctioning PD catheter.  Patient seen laying in bed Alert and oriented Husband at bedside Continue to pass gas Abdomen noticeably reduced in size. Rt chest permcath in place   Objective:  Vital signs in last 24 hours:  Temp:  [98.3 F (36.8 C)-100.7 F (38.2 C)] 98.7 F (37.1 C) (09/06 1230) Pulse Rate:  [62-81] 71 (09/06 1230) Resp:  [16-20] 17 (09/06 1230) BP: (128-164)/(50-87) 133/66 (09/06 1230) SpO2:  [92 %-98 %] 95 % (09/06 1230)  Weight change:  Filed Weights   07/30/23 1726 08/01/23 0700  Weight: 99.8 kg 101.3 kg    Intake/Output: I/O last 3 completed shifts: In: 200.1 [IV Piggyback:200.1] Out: 1650 [Urine:1650]   Intake/Output this shift:  Total I/O In: 300 [I.V.:300] Out: 305 [Urine:300; Blood:5]  Physical Exam: General: NAD  Head: Normocephalic, atraumatic. Dry oral mucosal membranes  Eyes: Anicteric  Lungs:  Clear to auscultation, [place] O2  Heart: Regular rate and rhythm  Abdomen:  Soft, tenderness, distention (improving)  Extremities:  no peripheral edema.   Neurologic: Alert, moving all four extremities  Skin: No lesions  Access: Rt chest permcath placed on 08/02/23    Basic Metabolic Panel: Recent Labs  Lab 07/30/23 1731 07/31/23 0538 08/01/23 0524  NA 133* 133* 131*  K 4.8 5.3* 4.6  CL 99 102 100  CO2 20* 21* 19*  GLUCOSE 232* 212* 135*  BUN 63* 67* 64*  CREATININE 5.38* 5.53* 5.12*  CALCIUM 9.2 8.7* 8.7*  PHOS  --   --  3.8    Liver Function Tests: Recent Labs  Lab 07/30/23 1731 07/31/23 0538 08/01/23 0524  AST 16 14*  --   ALT 26 20  --   ALKPHOS 103 94  --   BILITOT 0.6 0.7  --   PROT 7.8 7.0  --   ALBUMIN 3.4* 2.8* 2.6*   No results for input(s):  "LIPASE", "AMYLASE" in the last 168 hours.  No results for input(s): "AMMONIA" in the last 168 hours.  CBC: Recent Labs  Lab 07/30/23 1731 07/31/23 0538 08/01/23 0524  WBC 14.0* 12.4* 9.0  NEUTROABS 11.5*  --   --   HGB 11.6* 10.2* 9.5*  HCT 37.7 31.6* 29.2*  MCV 105.0* 100.3* 99.0  PLT 408* 336 333    Cardiac Enzymes: No results for input(s): "CKTOTAL", "CKMB", "CKMBINDEX", "TROPONINI" in the last 168 hours.  BNP: Invalid input(s): "POCBNP"  CBG: Recent Labs  Lab 08/01/23 1659 08/01/23 2048 08/02/23 0802 08/02/23 1017 08/02/23 1207  GLUCAP 283* 257* 81 100* 105*    Microbiology: Results for orders placed or performed during the hospital encounter of 07/30/23  Culture, blood (routine x 2)     Status: None (Preliminary result)   Collection Time: 07/30/23  5:31 PM   Specimen: BLOOD  Result Value Ref Range Status   Specimen Description BLOOD RIGHT ANTECUBITAL  Final   Special Requests   Final    BOTTLES DRAWN AEROBIC AND ANAEROBIC Blood Culture adequate volume   Culture   Final    NO GROWTH 3 DAYS Performed at [facility], [address] [place], [place] [address]    Report Status PENDING  Incomplete  Culture, blood (routine x 2)     Status: None (  Preliminary result)   Collection Time: 07/30/23  7:00 PM   Specimen: BLOOD  Result Value Ref Range Status   Specimen Description BLOOD BLOOD LEFT FOREARM  Final   Special Requests   Final    BOTTLES DRAWN AEROBIC AND ANAEROBIC Blood Culture results may not be optimal due to an inadequate volume of blood received in culture bottles   Culture   Final    NO GROWTH 3 DAYS Performed at [facility], [address], [place], [place] [address]    Report Status PENDING  Incomplete  Urine Culture     Status: Abnormal   Collection Time: 07/30/23  7:43 PM   Specimen: Urine, Random  Result Value Ref Range Status   Specimen Description   Final    URINE, RANDOM Performed at [facility], [address] [place], [place] [address]    Special Requests   Final    NONE Reflexed from [phone] Performed at [facility], [address] [place], [place] [address]    Culture (A)  Final    <10,000 COLONIES/mL INSIGNIFICANT GROWTH Performed at [facility], [address] [place], [place] [address]    Report Status 08/01/2023 FINAL  Final    Coagulation Studies: No results for input(s): "LABPROT", "INR" in the last 72 hours.  Urinalysis: Recent Labs    07/30/23 1943  COLORURINE YELLOW*  LABSPEC 1.011  PHURINE 6.0  GLUCOSEU 50*  HGBUR NEGATIVE  BILIRUBINUR NEGATIVE  KETONESUR NEGATIVE  PROTEINUR 100*  NITRITE NEGATIVE  LEUKOCYTESUR LARGE*      Imaging: PERIPHERAL VASCULAR CATHETERIZATION  Result Date: 08/01/2023 See surgical note for result.    Medications:    sodium chloride      amoxicillin  1,000 mg Oral Q12H   atorvastatin  20 mg Oral Daily   buPROPion  150 mg Oral Daily   busPIRone  5 mg Oral Daily   Chlorhexidine Gluconate Cloth  6 each Topical Daily   cloNIDine  0.1 mg Oral BID   heparin  5,000 Units Subcutaneous Q12H   insulin aspart  0-5 Units Subcutaneous QHS   insulin aspart  0-9 Units Subcutaneous TID WC   insulin aspart  3 Units Subcutaneous TID WC   insulin glargine-yfgn  15 Units Subcutaneous BID   lactulose  30 g Oral BID   metoprolol tartrate  100 mg Oral BID   patiromer  8.4 g Oral Daily   sodium bicarbonate  650 mg Oral BID   sodium chloride flush  3 mL Intravenous Q12H   sodium chloride flush  3 mL Intravenous Q12H   sodium chloride, acetaminophen **OR** acetaminophen, alteplase, heparin sodium (porcine) 1,000 Units in dialysis solution 2.5% low-MG/low-CA 2,000 mL dialysis solution, morphine injection, sodium chloride flush  Assessment/ Plan:  [name] is a 77 y.o.  female with end stage renal disease on peritoneal dialysis, hypertension, GERD, gout, hyeprlipidemia, diabetes mellitus type II and vertigo who  presents to [facility] on 07/30/2023 for Peritonitis (HCC) [K65.9] ESRD on peritoneal dialysis (HCC) [N18.6, Z99.2] Abdominal pain [R10.9]  End Stage Renal Disease: just started peritoneal dialysis training.  - Appreciate vascular surgery placing permcath on 08/01/23 - Appreciate general surgery removing PD catheter on 08/02/23 - Will receive hemodialysis on Saturday - Renal navigator will arrange backup hemodialysis chair.    Abdominal pain: multifactorial: peritonitis vs constipation Unable to obtain sample. Patient now having flatus but no bowel movement. Has refused lactulose. Abdomen distention improving  Hypertension with chronic  kidney disease: blood pressure currently at goal.  - Current regimen of clonidine and metoprolol. - Blood pressure stable  Diabetes mellitus type II with chronic kidney disease: insulin dependent. Novolog and Lantus outpatient.   Anemia with chronic kidney disease: macrocytic. Was getting ESA and IV iron as outpatient.  -Hgb 9.5, will obtain updated labs in am  6.  CT of the abdomen pelvis without contrast shows mild to moderate right hydroureteronephrosis with right ureteral stent, mesenteric edema.  Urologist has evaluated the scan and the condition is deemed chronic but stable.  Stent was placed in February.  Due for change as per urology protocol.   LOS: 3   9/6/20243:05 PM

## 2023-08-02 NOTE — Transfer of Care (Signed)
Immediate Anesthesia Transfer of Care Note  Patient: Heather Lopez  Procedure(s) Performed: CONTINUOUS AMBULATORY PERITONEAL DIALYSIS  (CAPD) CATHETER REMOVAL  Patient Location: PACU  Anesthesia Type:General  Level of Consciousness: drowsy  Airway & Oxygen Therapy: Patient Spontanous Breathing and Patient connected to nasal cannula oxygen  Post-op Assessment: Report given to RN and Post -op Vital signs reviewed and stable  Post vital signs: Reviewed and stable  Last Vitals:  Vitals Value Taken Time  BP 137/50 08/02/23 1203  Temp    Pulse 79 08/02/23 1205  Resp 19 08/02/23 1205  SpO2 95 % 08/02/23 1205  Vitals shown include unfiled device data.  Last Pain:  Vitals:   08/02/23 0946  TempSrc: Temporal  PainSc: 0-No pain         Complications: No notable events documented.

## 2023-08-02 NOTE — Anesthesia Procedure Notes (Signed)
Procedure Name: Intubation Date/Time: 08/02/2023 11:05 AM  Performed by: Lily Lovings, CRNAPre-anesthesia Checklist: Patient identified, Patient being monitored, Timeout performed, Emergency Drugs available and Suction available Patient Re-evaluated:Patient Re-evaluated prior to induction Oxygen Delivery Method: Circle system utilized Preoxygenation: Pre-oxygenation with 100% oxygen Induction Type: IV induction Ventilation: Oral airway inserted - appropriate to patient size and Two handed mask ventilation required Laryngoscope Size: McGraph and 3 Grade View: Grade I Tube type: Oral Tube size: 7.0 mm Number of attempts: 1 Airway Equipment and Method: Stylet and Oral airway Placement Confirmation: ETT inserted through vocal cords under direct vision, positive ETCO2 and breath sounds checked- equal and bilateral Secured at: 21 cm Tube secured with: Tape Dental Injury: Teeth and Oropharynx as per pre-operative assessment  Comments: Inserted by Hassel Neth

## 2023-08-02 NOTE — Anesthesia Preprocedure Evaluation (Signed)
Anesthesia Evaluation  Patient identified by MRN, date of birth, ID band Patient awake    Reviewed: Allergy & Precautions, H&P , NPO status , Patient's Chart, lab work & pertinent test results, reviewed documented beta blocker date and time   Airway Mallampati: II  TM Distance: >3 FB Neck ROM: full    Dental  (+) Teeth Intact, Dental Advidsory Given   Pulmonary neg pulmonary ROS, former smoker   Pulmonary exam normal        Cardiovascular Exercise Tolerance: Good hypertension, On Medications negative cardio ROS Normal cardiovascular exam+ dysrhythmias  Rate:Normal     Neuro/Psych  PSYCHIATRIC DISORDERS Anxiety Depression    negative neurological ROS  negative psych ROS   GI/Hepatic negative GI ROS, Neg liver ROS, PUD,GERD  Medicated and Controlled,,  Endo/Other  negative endocrine ROSdiabetes    Renal/GU Dialysis and CRFRenal disease  negative genitourinary   Musculoskeletal   Abdominal   Peds  Hematology negative hematology ROS (+) Blood dyscrasia, anemia   Anesthesia Other Findings Past Medical History: 06/27/2021: Acute renal failure (HCC) No date: Anxiety No date: Aortic atherosclerosis (HCC) No date: Cataract No date: Depression No date: Dysrhythmia No date: GERD (gastroesophageal reflux disease) No date: Gout No date: History of kidney stones No date: Hyperlipidemia No date: Hypertension 12/02/2021: Nausea & vomiting No date: Obesity 06/2021: Obstruction of left ureteropelvic junction (UPJ) due to stone No date: Osteoarthritis 06/27/2021: Perforated duodenal ulcer (HCC) 06/2021: Pyonephrosis     Comment:  left 06/27/2021: Septic shock (HCC) No date: Type 2 diabetes mellitus with stage 5 chronic kidney disease  (HCC) No date: Vertigo  Past Surgical History: 2019: CATARACT EXTRACTION, BILATERAL; Bilateral 08/19/2015: COLONOSCOPY WITH PROPOFOL; N/A     Comment:  Procedure: COLONOSCOPY WITH  PROPOFOL;  Surgeon: Midge Minium, MD;  Location: [facility];  Service:               Endoscopy;  Laterality: N/A;  diabetic - insulin 12/22/2019: CYSTOSCOPY W/ RETROGRADES; Bilateral     Comment:  Procedure: CYSTOSCOPY WITH RETROGRADE PYELOGRAM;                Surgeon: Riki Altes, MD;  Location: [facility];                Service: Urology;  Laterality: Bilateral; 07/05/2021: CYSTOSCOPY W/ URETERAL STENT PLACEMENT; Bilateral     Comment:  Procedure: CYSTOSCOPY WITH RETROGRADE               PYELOGRAM/BILATERAL URETERAL STENT PLACEMENT;  Surgeon:               Riki Altes, MD;  Location: [facility];  Service:               Urology;  Laterality: Bilateral; 12/26/2021: CYSTOSCOPY W/ URETERAL STENT PLACEMENT; Bilateral     Comment:  Procedure: CYSTOSCOPY WITH STENT REMOVAL;  Surgeon:               Riki Altes, MD;  Location: [facility];  Service:               Urology;  Laterality: Bilateral; 03/13/2022: CYSTOSCOPY W/ URETERAL STENT PLACEMENT; Right     Comment:  Procedure: CYSTOSCOPY WITH RETROGRADE PYELOGRAM/URETERAL              STENT PLACEMENT;  Surgeon: Riki Altes, MD;  Location: [facility];  Service: Urology;  Laterality:               Right; 01/01/2023: CYSTOSCOPY W/ URETERAL STENT PLACEMENT; Right     Comment:  Procedure: CYSTOSCOPY WITH STENT EXCHANGE;  Surgeon:               Riki Altes, MD;  Location: [facility];  Service:               Urology;  Laterality: Right; 05/08/2022: CYSTOSCOPY W/ URETERAL STENT REMOVAL; Right     Comment:  Procedure: CYSTOSCOPY WITH STENT REMOVAL;  Surgeon:               Riki Altes, MD;  Location: [facility];  Service:               Urology;  Laterality: Right; 12/22/2019: CYSTOSCOPY WITH BIOPSY; N/A     Comment:  Procedure: CYSTOSCOPY WITH bladder BIOPSY;  Surgeon:               Riki Altes, MD;  Location: [facility];  Service:               Urology;  Laterality: N/A; 03/13/2022: CYSTOSCOPY WITH  BIOPSY     Comment:  Procedure: CYSTOSCOPY WITH BLADDER BIOPSY;  Surgeon:               Riki Altes, MD;  Location: [facility];  Service:               Urology;; 12/22/2019: CYSTOSCOPY WITH STENT PLACEMENT; Right     Comment:  Procedure: CYSTOSCOPY WITH STENT PLACEMENT;  Surgeon:               Riki Altes, MD;  Location: [facility];  Service:               Urology;  Laterality: Right; 07/03/2022: CYSTOSCOPY WITH STENT PLACEMENT; Right     Comment:  Procedure: CYSTOSCOPY/RIGHT URETEROSCOPY WITH STENT               PLACEMENT;  Surgeon: Riki Altes, MD;  Location:               [facility];  Service: Urology;  Laterality: Right; 12/22/2019: CYSTOSCOPY WITH URETEROSCOPY; Bilateral     Comment:  Procedure: CYSTOSCOPY WITH URETEROSCOPY;  Surgeon:               Riki Altes, MD;  Location: [facility];  Service:               Urology;  Laterality: Bilateral; 12/22/2019: CYSTOSCOPY/URETEROSCOPY/HOLMIUM LASER/STENT PLACEMENT;  Right     Comment:  Procedure: CYSTOSCOPY/URETEROSCOPY/HOLMIUM LASER/STENT               PLACEMENT;  Surgeon: Riki Altes, MD;  Location:               [facility];  Service: Urology;  Laterality: Right; 12/08/2021: CYSTOSCOPY/URETEROSCOPY/HOLMIUM LASER/STENT PLACEMENT;  Bilateral     Comment:  Procedure: CYSTOSCOPY/URETEROSCOPY/HOLMIUM LASER/STENT               PLACEMENT;  Surgeon: Sondra Come, MD;  Location:               [facility];  Service: Urology;  Laterality: Bilateral; 06/29/2021: DIALYSIS/PERMA CATHETER INSERTION; N/A     Comment:  Procedure: DIALYSIS/PERMA CATHETER INSERTION;  Surgeon:               Annice Needy,  MD;  Location: [facility];                Service: Cardiovascular;  Laterality: N/A; 07/13/2021: DIALYSIS/PERMA CATHETER INSERTION; N/A     Comment:  Procedure: DIALYSIS/PERMA CATHETER INSERTION;  Surgeon:               Annice Needy, MD;  Location: [facility];                Service: Cardiovascular;  Laterality:  N/A; 08/01/2023: DIALYSIS/PERMA CATHETER INSERTION; N/A     Comment:  Procedure: DIALYSIS/PERMA CATHETER INSERTION;  Surgeon:               Annice Needy, MD;  Location: [facility];                Service: Cardiovascular;  Laterality: N/A; No date: EYE SURGERY 08/01/2021: IR GASTROSTOMY TUBE MOD SED 06/29/2021: LAPAROTOMY; N/A     Comment:  Procedure: EXPLORATORY LAPAROTOMY WITH REPAIR OF               DUODENAL PERFORATION;  Surgeon: Henrene Dodge, MD;                Location: [facility];  Service: General;  Laterality: N/A; 08/19/2015: POLYPECTOMY     Comment:  Procedure: POLYPECTOMY;  Surgeon: Midge Minium, MD;                Location: [facility];  Service: Endoscopy;; 1992: TUBAL LIGATION  BMI    Body Mass Index: 39.56 kg/m      Reproductive/Obstetrics negative OB ROS                             Anesthesia Physical Anesthesia Plan  ASA: 3  Anesthesia Plan: General LMA and General   Post-op Pain Management:    Induction: Intravenous  PONV Risk Score and Plan: 4 or greater and Ondansetron, Dexamethasone, Midazolam and Treatment may vary due to age or medical condition  Airway Management Planned: LMA  Additional Equipment:   Intra-op Plan:   Post-operative Plan: Extubation in OR  Informed Consent: I have reviewed the patients History and Physical, chart, labs and discussed the procedure including the risks, benefits and alternatives for the proposed anesthesia with the patient or authorized representative who has indicated his/her understanding and acceptance.     Dental Advisory Given  Plan Discussed with: Anesthesiologist, CRNA and Surgeon  Anesthesia Plan Comments: (Patient consented for risks of anesthesia including but not limited to:  - adverse reactions to medications - damage to eyes, teeth, lips or other oral mucosa - nerve damage due to positioning  - sore throat or hoarseness - Damage to heart, brain, nerves, lungs,  other parts of body or loss of life  Patient voiced understanding.)       Anesthesia Quick Evaluation

## 2023-08-02 NOTE — TOC Initial Note (Signed)
Transition of Care [facility]) - Initial/Assessment Note    Patient Details  Name: Heather Lopez MRN: [id number] Date of Birth: 1945/12/16  Transition of Care [facility]) CM/SW Contact:    Truddie Hidden, RN Phone Number: 08/02/2023, 3:47 PM  Clinical Narrative:                 Admitted TKZ:[id number] peritonitis Admitted from: [facility]  [facility] Current home health/prior home health/DME:WC, shower chair, and walker Transportation: husband takes her to appointments and will transport home HH: not currently active, she would [facility] if HH needed.    Expected Discharge Plan: Home w Home Health Services Barriers to Discharge: Continued Medical Work up   Patient Goals and CMS Choice Patient states their goals for this hospitalization and ongoing recovery are:: Home   Choice offered to / list presented to : Patient      Expected Discharge Plan and Services                                              Prior Living Arrangements/Services   Lives with:: Self, Spouse Patient language and need for interpreter reviewed:: Yes Do you feel safe going back to the place where you live?: Yes      Need for Family Participation in Patient Care: Yes (Comment) Care giver support system in place?: Yes (comment)   Criminal Activity/Legal Involvement Pertinent to Current Situation/Hospitalization: No - Comment as needed  Activities of Daily Living Home Assistive Devices/Equipment: Other (Comment) ADL Screening (condition at time of admission) Patient's cognitive ability adequate to safely complete daily activities?: Yes Is the patient deaf or have difficulty hearing?: No Does the patient have difficulty seeing, even when wearing glasses/contacts?: No Does the patient have difficulty concentrating, remembering, or making decisions?: No Patient able to express need for assistance with ADLs?: Yes Does the patient have difficulty dressing or bathing?:  No Independently performs ADLs?: No Communication: Independent Dressing (OT): Independent Grooming: Independent Feeding: Independent Bathing: Needs assistance Is this a change from baseline?: Change from baseline, expected to last <3 days Toileting: Needs assistance Is this a change from baseline?: Change from baseline, expected to last <3 days In/Out Bed: Needs assistance Is this a change from baseline?: Change from baseline, expected to last <3 days Walks in Home: Needs assistance Is this a change from baseline?: Change from baseline, expected to last <3 days Does the patient have difficulty walking or climbing stairs?: Yes Weakness of Legs: Both Weakness of Arms/Hands: None  Permission Sought/Granted                  Emotional Assessment Appearance:: Appears stated age Attitude/Demeanor/Rapport: Gracious, Engaged Affect (typically observed): Accepting Orientation: : Oriented to Self, Oriented to Place, Oriented to  Time, Oriented to Situation Alcohol / Substance Use: Not Applicable Psych Involvement: No (comment)  Admission diagnosis:  Peritonitis (HCC) [K65.9] ESRD on peritoneal dialysis (HCC) [N18.6, Z99.2] Abdominal pain [R10.9] Patient Active Problem List   Diagnosis Date Noted   ESRD on peritoneal dialysis (HCC) 07/31/2023   SBP (spontaneous bacterial peritonitis) (HCC) 07/30/2023   Pseudomonas urinary tract infection 02/20/2022   Decubitus ulcer of sacral region, stage 4 (HCC) 12/19/2021   Lactic acidosis 12/19/2021   Status post placement of ureteral stent 12/09/2021 12/19/2021   AKI (acute kidney injury) (HCC) 12/19/2021   Decubitus ulcer of sacral region, stage  1 12/13/2021   UTI (urinary tract infection) 12/02/2021   Chronic osteomyelitis of coccyx (HCC) 12/02/2021   Abdominal pain 12/02/2021   Nausea & vomiting 12/02/2021   Hyperkalemia 12/02/2021   Hyponatremia 12/02/2021   Hyperglycemia due to diabetes mellitus (HCC) 12/02/2021   Elevated alkaline  phosphatase level 12/02/2021   Elevated lipase 12/02/2021   Hypoalbuminemia due to protein-calorie malnutrition (HCC) 12/02/2021   Obesity (BMI 30-39.9) 12/02/2021   Anxiety 10/10/2021   Primary localized osteoarthritis of pelvic region and thigh 10/10/2021   Benign hypertensive kidney disease with chronic kidney disease 09/26/2021   Secondary hyperparathyroidism of renal origin (HCC) 09/26/2021   Hypokalemia 07/20/2021   Renal abscess    Essential hypertension    Lethargy    Duodenal ulcer perforation (HCC)    Demand ischemia of myocardium    Atrial flutter with rapid ventricular response (HCC) 07/07/2021   Endotracheally intubated 07/07/2021   Bilateral nephrolithiasis 07/07/2021   Acute on chronic anemia 07/07/2021   Pressure injury of skin 06/30/2021   Pneumoperitoneum    Acute respiratory failure with hypoxia (HCC) 06/27/2021   Severe sepsis with septic shock (HCC) 06/27/2021   Acute lower UTI 06/27/2021   Acute kidney injury superimposed on CKD (HCC) 06/27/2021   Diabetes mellitus, type II (HCC) 03/03/2018   Vitamin D deficiency 08/21/2016   IBS (irritable bowel syndrome) 08/21/2016   Special screening for malignant neoplasms, colon    Benign neoplasm of transverse colon    Benign neoplasm of sigmoid colon    T2DM (type 2 diabetes mellitus) (HCC) 07/12/2015   Hyperlipidemia associated with type 2 diabetes mellitus (HCC) 07/12/2015   Depression 07/12/2015   BMI 40.0-44.9, adult (HCC) 07/12/2015   PCP:  Carren Rang, PA-C Pharmacy:   [facility] - [place], [place] - [address] [place] [address] Phone: [phone] Fax: [phone]  [id number] - [place], [place] - [address] AT [facility] [address] [facility] [address] [place] [address] Phone: [phone] Fax: [phone]  [id number] - [place], [place] - [address] [place]  [address] Phone: [phone] Fax: [phone]     Social Determinants of Health (SDOH) Social History: SDOH Screenings   Food Insecurity: No Food Insecurity (07/31/2023)  Housing: Low Risk  (07/31/2023)  Transportation Needs: No Transportation Needs (07/31/2023)  Utilities: Not At Risk (07/31/2023)  Depression (PHQ2-9): Low Risk  (03/01/2022)  Financial Resource Strain: Low Risk  (07/14/2023)   Received from [facility]  Physical Activity: Unknown (07/14/2023)   Received from [facility]  Social Connections: Socially Integrated (07/14/2023)   Received from [facility]  Stress: No Stress Concern Present (07/14/2023)   Received from [facility]  Tobacco Use: Medium Risk (07/30/2023)   SDOH Interventions:     Readmission Risk Interventions    08/02/2023    3:12 PM 12/20/2021    1:52 PM 12/03/2021   11:26 AM  Readmission Risk Prevention Plan  Transportation Screening Complete Complete Complete  PCP or Specialist Appt within 3-5 Days Complete    HRI or Home Care Consult Complete Complete   Social Work Consult for Recovery Care Planning/Counseling Complete Complete   Palliative Care Screening Not Applicable Complete   Medication Review Oceanographer) Complete Complete Complete  PCP or Specialist appointment within 3-5 days of discharge   Complete  SW Recovery Care/Counseling Consult   Complete  Palliative Care Screening  Not Applicable  Skilled Nursing Facility   Complete

## 2023-08-02 NOTE — Assessment & Plan Note (Signed)
PRN morphine.  Suspect 2/2 PD catheter dysfunction and SBP Ct ABD negative for perforation or colitis.

## 2023-08-03 DIAGNOSIS — E875 Hyperkalemia: Secondary | ICD-10-CM | POA: Diagnosis not present

## 2023-08-03 DIAGNOSIS — N186 End stage renal disease: Secondary | ICD-10-CM | POA: Diagnosis not present

## 2023-08-03 DIAGNOSIS — R1084 Generalized abdominal pain: Secondary | ICD-10-CM | POA: Diagnosis not present

## 2023-08-03 DIAGNOSIS — I10 Essential (primary) hypertension: Secondary | ICD-10-CM | POA: Diagnosis not present

## 2023-08-03 LAB — RENAL FUNCTION PANEL
Albumin: 2.7 g/dL — ABNORMAL LOW (ref 3.5–5.0)
Anion gap: 12 (ref 5–15)
BUN: 82 mg/dL — ABNORMAL HIGH (ref 8–23)
CO2: 19 mmol/L — ABNORMAL LOW (ref 22–32)
Calcium: 9.1 mg/dL (ref 8.9–10.3)
Chloride: 96 mmol/L — ABNORMAL LOW (ref 98–111)
Creatinine, Ser: 5.7 mg/dL — ABNORMAL HIGH (ref 0.44–1.00)
GFR, Estimated: 7 mL/min — ABNORMAL LOW (ref >60)
Glucose, Bld: 550 mg/dL (ref 70–99)
Phosphorus: 4.4 mg/dL (ref 2.5–4.6)
Potassium: 5.3 mmol/L — ABNORMAL HIGH (ref 3.5–5.1)
Sodium: 127 mmol/L — ABNORMAL LOW (ref 135–145)

## 2023-08-03 LAB — GLUCOSE, CAPILLARY
Glucose-Capillary: 549 mg/dL (ref 70–99)
Glucose-Capillary: 448 mg/dL — ABNORMAL HIGH (ref 70–99)
Glucose-Capillary: 448 mg/dL — ABNORMAL HIGH (ref 70–99)
Glucose-Capillary: 450 mg/dL — ABNORMAL HIGH (ref 70–99)
Glucose-Capillary: 180 mg/dL — ABNORMAL HIGH (ref 70–99)
Glucose-Capillary: 175 mg/dL — ABNORMAL HIGH (ref 70–99)
Glucose-Capillary: 274 mg/dL — ABNORMAL HIGH (ref 70–99)
Glucose-Capillary: 371 mg/dL — ABNORMAL HIGH (ref 70–99)

## 2023-08-03 LAB — CBC
HCT: 29.6 % — ABNORMAL LOW (ref 36.0–46.0)
Hemoglobin: 9.6 g/dL — ABNORMAL LOW (ref 12.0–15.0)
MCH: 32.7 pg (ref 26.0–34.0)
MCHC: 32.4 g/dL (ref 30.0–36.0)
MCV: 100.7 fL — ABNORMAL HIGH (ref 80.0–100.0)
Platelets: 335 10*3/uL (ref 150–400)
RBC: 2.94 MIL/uL — ABNORMAL LOW (ref 3.87–5.11)
RDW: 14.7 % (ref 11.5–15.5)
WBC: 8.5 10*3/uL (ref 4.0–10.5)
nRBC: 0 % (ref 0.0–0.2)

## 2023-08-03 MED ORDER — INSULIN ASPART 100 UNIT/ML IJ SOLN
20.0000 [IU] | Freq: Once | INTRAMUSCULAR | Status: AC
  Administered 2023-08-03: 20 [IU] via SUBCUTANEOUS

## 2023-08-03 MED ORDER — HEPARIN SODIUM (PORCINE) 1000 UNIT/ML IJ SOLN
INTRAMUSCULAR | Status: AC
  Filled 2023-08-03: qty 10

## 2023-08-03 NOTE — Progress Notes (Signed)
Total of 35 units of novolog given to patient overnight . CBG 448 this morning . Manuela Schwartz ,NP notified. Per NP continue to monitor

## 2023-08-03 NOTE — Progress Notes (Signed)
Patient CBG 448. HD is calling for patient. NP Jon Billings notified.Per NP pass results on to day shift RN. HD nurse will not take patient due to high CBG

## 2023-08-03 NOTE — Plan of Care (Signed)
  Problem: Education: Goal: Ability to describe self-care measures that may prevent or decrease complications (Diabetes Survival Skills Education) will improve Outcome: Progressing   Problem: Coping: Goal: Ability to adjust to condition or change in health will improve Outcome: Progressing   

## 2023-08-03 NOTE — Progress Notes (Signed)
  Received patient in bed to unit.   Informed consent signed and in chart.    TX duration:  2.5 hrs     Transported back to floor  Hand-off given to patient's nurse. No c/o and no distress noted    Access used: R HD Catheter Access issues: none   Total UF removed: 1.0L Medication(s) given: none Post HD VS: 142/66 Post HD weight: 100.0kg     Lynann Beaver  [facility]

## 2023-08-03 NOTE — Assessment & Plan Note (Signed)
CBG elevated.  Patient was on Trulicity, 60 units of Lantus and NovoLog at home.  Patient required diabetes education as she was eating a lot of sugary stuff causing elevated CBG requiring extra NovoLog -Semglee 15 units twice daily -SSI plus mealtime coverage

## 2023-08-03 NOTE — Progress Notes (Signed)
Progress Note   Patient: Heather Lopez QMV:[id number] DOB: 1946/05/23 DOA: 07/30/2023     4 DOS: the patient was seen and examined on 08/03/2023   Brief hospital course: Taken from H&P.   Heather Lopez is a 77 y.o. female with medical history significant for end-stage renal disease on peritoneal dialysis, hypertension, gout, GERD, perforated duodenal ulcer, diabetes mellitus type 2, patient coming to [place] for abdominal pain that is been going on for about 7 days.  She was at her nephrology appointment today and had abdominal pain and peritoneal fluid collected was positive for it concerning for for peritonitis and patient was referred to the emergency room.  On initial presentation patient has mildly elevated blood pressure, afebrile but later developed fever at 101.9.  Labs pertinent for leukocytosis at 14, glucose of 232, renal function consistent with ESRD. CT abdomen and pelvis with no acute abnormalities, similar mild to moderate right hydronephrosis about the right ureteral stent with periureteral fat stranding.  Small volume abdominopelvic ascites with PD catheter in place. Moderate to large stool burden in the right colon and mild skin thickening and stranding in the anterior abdominal wall, correlate for cellulitis.  Patient was started on cefepime and vancomycin.  9/4: Some improvement in leukocytosis.  Apparently dialysis nurse was unable to drain dialysis fluid due to increased thickness despite using tPA.  IR was consulted for drainage, repeat ultrasound with no significant amount of fluid which can be drained by IR.  Mild hyperkalemia at 5.3-starting Veltassa Significant stool burden which can be contributory to her symptoms-giving soapsuds enema.  Nephrology would like to continue with broad-spectrum antibiotics for now.  9/5: Permanent HD catheter was placed by vascular surgery today.  PD catheter to be removed by general surgery likely tomorrow.  Hemodialysis will be started from  tomorrow. Patient need to follow-up with outpatient urology regarding chronic hydronephrosis which seems stable, patient had stents placed in February and will need either exchange or removal.  9/6: Cultures from peritoneal fluid drawn at nephrology office with Enterococcus faecalis resistant to vancomycin.  Antibiotics switched with amoxicillin at 1 g twice daily p.o. for 3 weeks.  PD catheter was removed by general surgery.  Need outpatient dialysis chair before discharge.  9/7: CBG significantly elevated this morning, requiring extra NovoLog, apparently she was eating rice crispy treat overnight.  Had her HD done today, will need outpatient chair before discharge.   Assessment and Plan: * SBP (spontaneous bacterial peritonitis) (HCC) Patient met sepsis criteria with fever and leukocytosis likely secondary to SBP. Malfunctioning PD catheter which was removed today by general surgery and patient was started on hemodialysis. Initial cultures with Enterococcus faecalis resistant to vancomycin -Started on p.o. amoxicillin 1 g twice daily for 3 weeks according to susceptibility result -Supportive care  Abdominal pain PRN morphine.  Suspect 2/2 PD catheter dysfunction and SBP Ct ABD negative for perforation or colitis.   Hyperkalemia Resolved with Veltassa. -Continue to monitor  ESRD on peritoneal dialysis [facility]) Patient was on peritoneal dialysis, malfunctioning PD catheter so a permanent HD catheter was placed  by vascular surgery.  PD catheter removed -Patient to start on hemodialysis -Nephrology is on board -Will need outpatient chair before discharge  T2DM (type 2 diabetes mellitus) (HCC) CBG elevated.  Patient was on Trulicity, 60 units of Lantus and NovoLog at home.  Patient required diabetes education as she was eating a lot of sugary stuff causing elevated CBG requiring extra NovoLog -Semglee 15 units twice daily -SSI plus mealtime  coverage   Essential hypertension Continue  patient on metoprolol and clonidine.  Decubitus ulcer of sacral region, stage 1 Stage stage I bilateral erythema on both the buttocks.  No wound documented and reported by patient's nurse.   Wound care skin care.  Barrier protection.  Obesity (BMI 30-39.9) Estimated body mass index is 38.97 kg/m as calculated from the following:   Height as of this encounter: 5\' 3"  (1.6 m).   Weight as of this encounter: 99.8 kg.   -This will complicate overall prognosis      Subjective: Patient was seen during dialysis.  Abdominal pain has been improved.  We discussed about following low-carb diet due to significantly elevated blood sugar this morning.  Per patient she did not think that few rice crispy treats will increase that much of sugar.  Physical Exam: Vitals:   08/03/23 1030 08/03/23 1100 08/03/23 1107 08/03/23 1133  BP: 139/63 (!) 126/56 (!) 142/66   Pulse: 64 77 75   Resp: 18 19 20    Temp:   97.7 F (36.5 C)   TempSrc:   Oral   SpO2: 98% 95% 98%   Weight:    100 kg  Height:       General.  Obese elderly lady, in no acute distress. Pulmonary.  Lungs clear bilaterally, normal respiratory effort. CV.  Regular rate and rhythm, no JVD, rub or murmur. Abdomen.  Soft, nontender, nondistended, BS positive. CNS.  Alert and oriented .  No focal neurologic deficit. Extremities.  No edema, no cyanosis, pulses intact and symmetrical. Psychiatry.  Judgment and insight appears normal.   Data Reviewed: Prior data reviewed  Family Communication:   Disposition: Status is: Inpatient Remains inpatient appropriate because: Severity of illness  Planned Discharge Destination: Home  DVT prophylaxis.  Subcu heparin Time spent: 45 minutes  This record has been created using Conservation officer, historic buildings. Errors have been sought and corrected,but may not always be located. Such creation errors do not reflect on the standard of care.   Author: Arnetha Courser, MD 08/03/2023 2:19 PM  For on  call review [email or web address].

## 2023-08-03 NOTE — Progress Notes (Signed)
[facility]  ROUNDING NOTE   Subjective:   Ms. JAMIESHA MARCHIANO was admitted to [facility] on 07/30/2023 for Peritonitis [facility]) [K65.9] ESRD on peritoneal dialysis (HCC) [N18.6, Z99.2] Abdominal pain [R10.9]  Patient was [facility] [place] due to a malfunctioning PD catheter.  Patient seen and evaluated during dialysis   HEMODIALYSIS FLOWSHEET:  Blood Flow Rate (mL/min): 199 mL/min Arterial Pressure (mmHg): -91.91 mmHg Venous Pressure (mmHg): 101.61 mmHg TMP (mmHg): 24.44 mmHg Ultrafiltration Rate (mL/min): 507 mL/min Dialysate Flow Rate (mL/min): 300 ml/min  Tolerating treatment well No complaints Denies shortness of breath   Objective:  Vital signs in last 24 hours:  Temp:  [97.5 F (36.4 C)-97.9 F (36.6 C)] 97.7 F (36.5 C) (09/07 1107) Pulse Rate:  [57-77] 75 (09/07 1107) Resp:  [11-21] 20 (09/07 1107) BP: (117-178)/(53-91) 142/66 (09/07 1107) SpO2:  [93 %-98 %] 98 % (09/07 1107) Weight:  [100 kg-101 kg] 100 kg (09/07 1133)  Weight change:  Filed Weights   08/01/23 0700 08/03/23 0819 08/03/23 1133  Weight: 101.3 kg 101 kg 100 kg    Intake/Output: I/O last 3 completed shifts: In: 400.1 [I.V.:300; IV Piggyback:100.1] Out: 1030 [Urine:1025; Blood:5]   Intake/Output this shift:  Total I/O In: -  Out: 1000 [Other:1000]  Physical Exam: General: NAD  Head: Normocephalic, atraumatic. Dry oral mucosal membranes  Eyes: Anicteric  Lungs:  Clear to auscultation, [place] O2  Heart: Regular rate and rhythm  Abdomen:  Soft, tenderness  Extremities:  no peripheral edema.   Neurologic: Alert, moving all four extremities  Skin: No lesions  Access: Rt chest permcath placed on 08/02/23    Basic Metabolic Panel: Recent Labs  Lab 07/30/23 1731 07/31/23 0538 08/01/23 0524 08/02/23 2154 08/03/23 0349  NA 133* 133* 131*  --  127*  K 4.8 5.3* 4.6  --  5.3*  CL 99 102 100  --  96*  CO2 20* 21* 19*  --  19*  GLUCOSE 232* 212* 135* 649* 550*  BUN 63* 67*  64*  --  82*  CREATININE 5.38* 5.53* 5.12*  --  5.70*  CALCIUM 9.2 8.7* 8.7*  --  9.1  PHOS  --   --  3.8  --  4.4    Liver Function Tests: Recent Labs  Lab 07/30/23 1731 07/31/23 0538 08/01/23 0524 08/03/23 0349  AST 16 14*  --   --   ALT 26 20  --   --   ALKPHOS 103 94  --   --   BILITOT 0.6 0.7  --   --   PROT 7.8 7.0  --   --   ALBUMIN 3.4* 2.8* 2.6* 2.7*   No results for input(s): "LIPASE", "AMYLASE" in the last 168 hours.  No results for input(s): "AMMONIA" in the last 168 hours.  CBC: Recent Labs  Lab 07/30/23 1731 07/31/23 0538 08/01/23 0524 08/03/23 0349  WBC 14.0* 12.4* 9.0 8.5  NEUTROABS 11.5*  --   --   --   HGB 11.6* 10.2* 9.5* 9.6*  HCT 37.7 31.6* 29.2* 29.6*  MCV 105.0* 100.3* 99.0 100.7*  PLT 408* 336 333 335    Cardiac Enzymes: No results for input(s): "CKTOTAL", "CKMB", "CKMBINDEX", "TROPONINI" in the last 168 hours.  BNP: Invalid input(s): "POCBNP"  CBG: Recent Labs  Lab 08/03/23 0608 08/03/23 0651 08/03/23 0800 08/03/23 1034 08/03/23 1250  GLUCAP 448* 448* 450* 180* 175*    Microbiology: Results for orders placed or performed during the hospital encounter of 07/30/23  Culture, blood (routine x 2)     Status: None (Preliminary result)   Collection Time: 07/30/23  5:31 PM   Specimen: BLOOD  Result Value Ref Range Status   Specimen Description BLOOD RIGHT ANTECUBITAL  Final   Special Requests   Final    BOTTLES DRAWN AEROBIC AND ANAEROBIC Blood Culture adequate volume   Culture   Final    NO GROWTH 4 DAYS Performed at [facility], [address] [place], [place] [address]    Report Status PENDING  Incomplete  Culture, blood (routine x 2)     Status: None (Preliminary result)   Collection Time: 07/30/23  7:00 PM   Specimen: BLOOD  Result Value Ref Range Status   Specimen Description BLOOD BLOOD LEFT FOREARM  Final   Special Requests   Final    BOTTLES DRAWN AEROBIC AND ANAEROBIC Blood Culture results may not be  optimal due to an inadequate volume of blood received in culture bottles   Culture   Final    NO GROWTH 4 DAYS Performed at [facility], [address], [place], [place] [address]    Report Status PENDING  Incomplete  Urine Culture     Status: Abnormal   Collection Time: 07/30/23  7:43 PM   Specimen: Urine, Random  Result Value Ref Range Status   Specimen Description   Final    URINE, RANDOM Performed at [facility], [address] [place], [place] [address]    Special Requests   Final    NONE Reflexed from [id number] Performed at [facility], [address] [place], [place] [address]    Culture (A)  Final    <10,000 COLONIES/mL INSIGNIFICANT GROWTH Performed at [facility], [address], [place], [place] [address]    Report Status 08/01/2023 FINAL  Final    Coagulation Studies: No results for input(s): "LABPROT", "INR" in the last 72 hours.  Urinalysis: No results for input(s): "COLORURINE", "LABSPEC", "PHURINE", "GLUCOSEU", "HGBUR", "BILIRUBINUR", "KETONESUR", "PROTEINUR", "UROBILINOGEN", "NITRITE", "LEUKOCYTESUR" in the last 72 hours.  Invalid input(s): "APPERANCEUR"     Imaging: No results found.   Medications:    sodium chloride      amoxicillin  1,000 mg Oral Q12H   atorvastatin  20 mg Oral Daily   buPROPion  150 mg Oral Daily   busPIRone  5 mg Oral Daily   Chlorhexidine Gluconate Cloth  6 each Topical Daily   cloNIDine  0.1 mg Oral BID   heparin  5,000 Units Subcutaneous Q12H   insulin aspart  0-5 Units Subcutaneous QHS   insulin aspart  0-9 Units Subcutaneous TID WC   insulin aspart  3 Units Subcutaneous TID WC   insulin glargine-yfgn  15 Units Subcutaneous BID   lactulose  30 g Oral BID   metoprolol tartrate  100 mg Oral BID   patiromer  8.4 g Oral Daily   sodium bicarbonate  650 mg Oral BID   sodium chloride flush  3 mL Intravenous Q12H   sodium chloride flush  3 mL Intravenous Q12H   sodium chloride,  acetaminophen **OR** acetaminophen, alteplase, heparin sodium (porcine) 1,000 Units in dialysis solution 2.5% low-MG/low-CA 2,000 mL dialysis solution, morphine injection, sodium chloride flush  Assessment/ Plan:  [name] is a 76 y.o.  female with end stage renal disease on peritoneal dialysis, hypertension, GERD, gout, hyeprlipidemia, diabetes mellitus type II and vertigo who presents to [facility] on 07/30/2023 for Peritonitis (HCC) [K65.9] ESRD on peritoneal  dialysis (HCC) [N18.6, Z99.2] Abdominal pain [R10.9]  End Stage Renal Disease: just started peritoneal dialysis training.  - Appreciate vascular surgery placing permcath on 08/01/23 - Appreciate general surgery removing PD catheter on 08/02/23 - Treatment received earlier today, tolerated well - Next treatment scheduled for Monday. - Renal navigator will arrange backup hemodialysis chair.    Abdominal pain: multifactorial: peritonitis vs constipation Unable to obtain PD sample. Symptoms have improved with release of gas. PD catheter removed.   Hypertension with chronic kidney disease: blood pressure currently at goal.  - Current regimen of clonidine and metoprolol. - Blood pressure stable during dialysis  Diabetes mellitus type II with chronic kidney disease: insulin dependent. Novolog and Lantus outpatient.   Anemia with chronic kidney disease: macrocytic. Was getting ESA and IV iron as outpatient.  -Hgb 9.6, within range  6.  CT of the abdomen pelvis without contrast shows mild to moderate right hydroureteronephrosis with right ureteral stent, mesenteric edema.  Urologist has evaluated the scan and the condition is deemed chronic but stable.  Stent was placed in February.  Urology will follow outpatient and schedule stent exchange.    LOS: 4   9/7/20241:23 PM

## 2023-08-04 DIAGNOSIS — R1084 Generalized abdominal pain: Secondary | ICD-10-CM | POA: Diagnosis not present

## 2023-08-04 DIAGNOSIS — E875 Hyperkalemia: Secondary | ICD-10-CM | POA: Diagnosis not present

## 2023-08-04 DIAGNOSIS — I10 Essential (primary) hypertension: Secondary | ICD-10-CM | POA: Diagnosis not present

## 2023-08-04 DIAGNOSIS — N186 End stage renal disease: Secondary | ICD-10-CM | POA: Diagnosis not present

## 2023-08-04 LAB — CULTURE, BLOOD (ROUTINE X 2)
Culture: NO GROWTH
Culture: NO GROWTH
Special Requests: ADEQUATE

## 2023-08-04 LAB — GLUCOSE, CAPILLARY
Glucose-Capillary: 283 mg/dL — ABNORMAL HIGH (ref 70–99)
Glucose-Capillary: 356 mg/dL — ABNORMAL HIGH (ref 70–99)
Glucose-Capillary: 303 mg/dL — ABNORMAL HIGH (ref 70–99)
Glucose-Capillary: 361 mg/dL — ABNORMAL HIGH (ref 70–99)

## 2023-08-04 MED ORDER — INSULIN ASPART 100 UNIT/ML IJ SOLN
5.0000 [IU] | Freq: Three times a day (TID) | INTRAMUSCULAR | Status: DC
  Administered 2023-08-04 – 2023-08-05 (×3): 5 [IU] via SUBCUTANEOUS
  Filled 2023-08-04 (×4): qty 1

## 2023-08-04 MED ORDER — INSULIN GLARGINE-YFGN 100 UNIT/ML ~~LOC~~ SOLN
18.0000 [IU] | Freq: Two times a day (BID) | SUBCUTANEOUS | Status: DC
  Administered 2023-08-04 (×2): 18 [IU] via SUBCUTANEOUS
  Filled 2023-08-04 (×3): qty 0.18

## 2023-08-04 NOTE — Plan of Care (Signed)

## 2023-08-04 NOTE — Progress Notes (Signed)
Progress Note   Patient: Heather Lopez GNF:[id number] DOB: 09/03/1946 DOA: 07/30/2023     5 DOS: the patient was seen and examined on 08/04/2023   Brief hospital course: Taken from H&P.   Heather Lopez is a 77 y.o. female with medical history significant for end-stage renal disease on peritoneal dialysis, hypertension, gout, GERD, perforated duodenal ulcer, diabetes mellitus type 2, patient coming to [place] for abdominal pain that is been going on for about 7 days.  She was at her nephrology appointment today and had abdominal pain and peritoneal fluid collected was positive for it concerning for for peritonitis and patient was referred to the emergency room.  On initial presentation patient has mildly elevated blood pressure, afebrile but later developed fever at 101.9.  Labs pertinent for leukocytosis at 14, glucose of 232, renal function consistent with ESRD. CT abdomen and pelvis with no acute abnormalities, similar mild to moderate right hydronephrosis about the right ureteral stent with periureteral fat stranding.  Small volume abdominopelvic ascites with PD catheter in place. Moderate to large stool burden in the right colon and mild skin thickening and stranding in the anterior abdominal wall, correlate for cellulitis.  Patient was started on cefepime and vancomycin.  9/4: Some improvement in leukocytosis.  Apparently dialysis nurse was unable to drain dialysis fluid due to increased thickness despite using tPA.  IR was consulted for drainage, repeat ultrasound with no significant amount of fluid which can be drained by IR.  Mild hyperkalemia at 5.3-starting Veltassa Significant stool burden which can be contributory to her symptoms-giving soapsuds enema.  Nephrology would like to continue with broad-spectrum antibiotics for now.  9/5: Permanent HD catheter was placed by vascular surgery today.  PD catheter to be removed by general surgery likely tomorrow.  Hemodialysis will be started from  tomorrow. Patient need to follow-up with outpatient urology regarding chronic hydronephrosis which seems stable, patient had stents placed in February and will need either exchange or removal.  9/6: Cultures from peritoneal fluid drawn at nephrology office with Enterococcus faecalis resistant to vancomycin.  Antibiotics switched with amoxicillin at 1 g twice daily p.o. for 3 weeks.  PD catheter was removed by general surgery.  Need outpatient dialysis chair before discharge.  9/7: CBG significantly elevated this morning, requiring extra NovoLog, apparently she was eating rice crispy treat overnight.  Had her HD done today, will need outpatient chair before discharge.  9/8: CBG remained elevated, increasing Semglee to 18 units twice daily and mealtime coverage to 5 unit.  Awaiting outpatient confirmation of dialysis   Assessment and Plan: * SBP (spontaneous bacterial peritonitis) (HCC) Patient met sepsis criteria with fever and leukocytosis likely secondary to SBP. Malfunctioning PD catheter which was removed today by general surgery and patient was started on hemodialysis. Initial cultures with Enterococcus faecalis resistant to vancomycin -Started on p.o. amoxicillin 1 g twice daily for 3 weeks according to susceptibility result -Supportive care  Abdominal pain PRN morphine.  Suspect 2/2 PD catheter dysfunction and SBP Ct ABD negative for perforation or colitis.   Hyperkalemia Resolved with Veltassa. -Continue to monitor  ESRD on peritoneal dialysis [facility]) Patient was on peritoneal dialysis, malfunctioning PD catheter so a permanent HD catheter was placed  by vascular surgery.  PD catheter removed -Patient to start on hemodialysis -Nephrology is on board -Will need outpatient chair before discharge  T2DM (type 2 diabetes mellitus) (HCC) CBG elevated.  Patient was on Trulicity, 60 units of Lantus and NovoLog at home.  Patient required diabetes  education as she was eating a lot of  sugary stuff causing elevated CBG requiring extra NovoLog -Increased Semglee to 18 units twice daily -SSI  -Increasing mealtime coverage   Essential hypertension Continue patient on metoprolol and clonidine.  Decubitus ulcer of sacral region, stage 1 Stage stage I bilateral erythema on both the buttocks.  No wound documented and reported by patient's nurse.   Wound care skin care.  Barrier protection.  Obesity (BMI 30-39.9) Estimated body mass index is 38.97 kg/m as calculated from the following:   Height as of this encounter: 5\' 3"  (1.6 m).   Weight as of this encounter: 99.8 kg.   -This will complicate overall prognosis      Subjective: Patient was seen and examined today.  No new complaints.  No pain today.  Physical Exam: Vitals:   08/04/23 0436 08/04/23 0447 08/04/23 0820 08/04/23 1200  BP: (!) 147/63  (!) 144/78 116/69  Pulse: 64  68 67  Resp: 18  20 18   Temp: (!) 97.5 F (36.4 C)  98.3 F (36.8 C) 98.5 F (36.9 C)  TempSrc:   Oral Oral  SpO2: 96%  95% 98%  Weight:  102 kg    Height:       General.  Obese elderly lady, in no acute distress. Pulmonary.  Lungs clear bilaterally, normal respiratory effort. CV.  Regular rate and rhythm, no JVD, rub or murmur. Abdomen.  Soft, nontender, nondistended, BS positive. CNS.  Alert and oriented .  No focal neurologic deficit. Extremities.  No edema, no cyanosis, pulses intact and symmetrical. Psychiatry.  Judgment and insight appears normal.   Data Reviewed: Prior data reviewed  Family Communication: Discussed with patient  Disposition: Status is: Inpatient Remains inpatient appropriate because: Severity of illness  Planned Discharge Destination: Home  DVT prophylaxis.  Subcu heparin Time spent: 42 minutes  This record has been created using Conservation officer, historic buildings. Errors have been sought and corrected,but may not always be located. Such creation errors do not reflect on the standard of care.    Author: Arnetha Courser, MD 08/04/2023 12:01 PM  For on call review [email or web address].

## 2023-08-04 NOTE — Progress Notes (Signed)
[facility]  ROUNDING NOTE   Subjective:   [name] was admitted to [facility] on 07/30/2023 for Peritonitis [facility]) [K65.9] ESRD on peritoneal dialysis (HCC) [N18.6, Z99.2] Abdominal pain [R10.9]  Patient was sent [facility] ED from [facility] due to a malfunctioning PD catheter.  Patient seen laying in bed No family present at bedside Reports small BM overnight Remains on room air   Objective:  Vital signs in last 24 hours:  Temp:  [97.5 F (36.4 C)-99.2 F (37.3 C)] 98.3 F (36.8 C) (09/08 0820) Pulse Rate:  [64-77] 68 (09/08 0820) Resp:  [11-20] 20 (09/08 0820) BP: (120-154)/(55-78) 144/78 (09/08 0820) SpO2:  [93 %-98 %] 95 % (09/08 0820) Weight:  [100 kg-102 kg] 102 kg (09/08 0447)  Weight change:  Filed Weights   08/03/23 0819 08/03/23 1133 08/04/23 0447  Weight: 101 kg 100 kg 102 kg    Intake/Output: I/O last 3 completed shifts: In: -  Out: 2125 [Urine:1125; Other:1000]   Intake/Output this shift:  No intake/output data recorded.  Physical Exam: General: NAD  Head: Normocephalic, atraumatic. Dry oral mucosal membranes  Eyes: Anicteric  Lungs:  Clear to auscultation, room air  Heart: Regular rate and rhythm  Abdomen:  Soft, tenderness, distention improving  Extremities:  no peripheral edema.   Neurologic: Alert, moving all four extremities  Skin: No lesions  Access: Rt chest permcath placed on 08/02/23    Basic Metabolic Panel: Recent Labs  Lab 07/30/23 1731 07/31/23 0538 08/01/23 0524 08/02/23 2154 08/03/23 0349  NA 133* 133* 131*  --  127*  K 4.8 5.3* 4.6  --  5.3*  CL 99 102 100  --  96*  CO2 20* 21* 19*  --  19*  GLUCOSE 232* 212* 135* 649* 550*  BUN 63* 67* 64*  --  82*  CREATININE 5.38* 5.53* 5.12*  --  5.70*  CALCIUM 9.2 8.7* 8.7*  --  9.1  PHOS  --   --  3.8  --  4.4    Liver Function Tests: Recent Labs  Lab 07/30/23 1731 07/31/23 0538 08/01/23 0524 08/03/23 0349  AST 16 14*  --   --   ALT 26 20  --   --   ALKPHOS  103 94  --   --   BILITOT 0.6 0.7  --   --   PROT 7.8 7.0  --   --   ALBUMIN 3.4* 2.8* 2.6* 2.7*   No results for input(s): "LIPASE", "AMYLASE" in the last 168 hours.  No results for input(s): "AMMONIA" in the last 168 hours.  CBC: Recent Labs  Lab 07/30/23 1731 07/31/23 0538 08/01/23 0524 08/03/23 0349  WBC 14.0* 12.4* 9.0 8.5  NEUTROABS 11.5*  --   --   --   HGB 11.6* 10.2* 9.5* 9.6*  HCT 37.7 31.6* 29.2* 29.6*  MCV 105.0* 100.3* 99.0 100.7*  PLT 408* 336 333 335    Cardiac Enzymes: No results for input(s): "CKTOTAL", "CKMB", "CKMBINDEX", "TROPONINI" in the last 168 hours.  BNP: Invalid input(s): "POCBNP"  CBG: Recent Labs  Lab 08/03/23 1034 08/03/23 1250 08/03/23 1549 08/03/23 2024 08/04/23 0816  GLUCAP 180* 175* 274* 371* 283*    Microbiology: Results for orders placed or performed during the hospital encounter of 07/30/23  Culture, blood (routine x 2)     Status: None   Collection Time: 07/30/23  5:31 PM   Specimen: BLOOD  Result Value Ref Range Status   Specimen Description BLOOD RIGHT ANTECUBITAL  Final   Special Requests   Final    BOTTLES DRAWN AEROBIC AND ANAEROBIC Blood Culture adequate volume   Culture   Final    NO GROWTH 5 DAYS Performed at [facility], [address] [place], [place] [address]    Report Status 08/04/2023 FINAL  Final  Culture, blood (routine x 2)     Status: None   Collection Time: 07/30/23  7:00 PM   Specimen: BLOOD  Result Value Ref Range Status   Specimen Description BLOOD BLOOD LEFT FOREARM  Final   Special Requests   Final    BOTTLES DRAWN AEROBIC AND ANAEROBIC Blood Culture results may not be optimal due to an inadequate volume of blood received in culture bottles   Culture   Final    NO GROWTH 5 DAYS Performed at [facility], [address] [place], [place] [address]    Report Status 08/04/2023 FINAL  Final  Urine Culture     Status: Abnormal   Collection Time: 07/30/23  7:43 PM    Specimen: Urine, Random  Result Value Ref Range Status   Specimen Description   Final    URINE, RANDOM Performed at [facility], [address], [place], [place] [address]    Special Requests   Final    NONE Reflexed from [id number] Performed at [facility], [address] [place], [place] [address]    Culture (A)  Final    <10,000 COLONIES/mL INSIGNIFICANT GROWTH Performed at [facility], [address] [place], [place] [address]    Report Status 08/01/2023 FINAL  Final    Coagulation Studies: No results for input(s): "LABPROT", "INR" in the last 72 hours.  Urinalysis: No results for input(s): "COLORURINE", "LABSPEC", "PHURINE", "GLUCOSEU", "HGBUR", "BILIRUBINUR", "KETONESUR", "PROTEINUR", "UROBILINOGEN", "NITRITE", "LEUKOCYTESUR" in the last 72 hours.  Invalid input(s): "APPERANCEUR"     Imaging: No results found.   Medications:      amoxicillin  1,000 mg Oral Q12H   atorvastatin  20 mg Oral Daily   buPROPion  150 mg Oral Daily   busPIRone  5 mg Oral Daily   Chlorhexidine Gluconate Cloth  6 each Topical Daily   cloNIDine  0.1 mg Oral BID   heparin  5,000 Units Subcutaneous Q12H   insulin aspart  0-5 Units Subcutaneous QHS   insulin aspart  0-9 Units Subcutaneous TID WC   insulin aspart  5 Units Subcutaneous TID WC   insulin glargine-yfgn  18 Units Subcutaneous BID   lactulose  30 g Oral BID   metoprolol tartrate  100 mg Oral BID   patiromer  8.4 g Oral Daily   sodium bicarbonate  650 mg Oral BID   acetaminophen **OR** acetaminophen, alteplase, heparin sodium (porcine) 1,000 Units in dialysis solution 2.5% low-MG/low-CA 2,000 mL dialysis solution, morphine injection  Assessment/ Plan:  [name] is a 77 y.o.  female with end stage renal disease on peritoneal dialysis, hypertension, GERD, gout, hyeprlipidemia, diabetes mellitus type II and vertigo who presents to [facility] on 07/30/2023 for Peritonitis (HCC) [K65.9] ESRD on peritoneal  dialysis (HCC) [N18.6, Z99.2] Abdominal pain [R10.9]  End Stage Renal Disease: just started peritoneal dialysis training.  - Appreciate vascular surgery placing permcath on 08/01/23 - Appreciate general surgery removing PD catheter on 08/02/23 - Dialysis received yesterday, UF 1L achieved. - Next treatment scheduled for Monday. - Awaiting confirmation of outpatient hemodialysis chair by renal navigator.    Abdominal pain: multifactorial: peritonitis vs constipation Unable to  obtain PD sample.PD catheter removed. Reports small BM, continue bowel regimen.  Hypertension with chronic kidney disease: blood pressure currently at goal.  - Current regimen of clonidine and metoprolol. - Blood pressure remains stable  Diabetes mellitus type II with chronic kidney disease: insulin dependent. Novolog and Lantus outpatient.   Anemia with chronic kidney disease: macrocytic. Was getting ESA and IV iron as outpatient.  -Hgb 9.6 on 08/04/23  6.  CT of the abdomen pelvis without contrast shows mild to moderate right hydroureteronephrosis with right ureteral stent, mesenteric edema.  Urologist has evaluated the scan and the condition is deemed chronic but stable.  Stent was placed in February.  Urology will follow outpatient and schedule stent exchange.    LOS: 5   9/8/20249:51 AM

## 2023-08-05 DIAGNOSIS — E1122 Type 2 diabetes mellitus with diabetic chronic kidney disease: Secondary | ICD-10-CM | POA: Diagnosis not present

## 2023-08-05 DIAGNOSIS — E875 Hyperkalemia: Secondary | ICD-10-CM | POA: Diagnosis not present

## 2023-08-05 DIAGNOSIS — K659 Peritonitis, unspecified: Principal | ICD-10-CM

## 2023-08-05 DIAGNOSIS — N186 End stage renal disease: Secondary | ICD-10-CM | POA: Diagnosis not present

## 2023-08-05 DIAGNOSIS — Z794 Long term (current) use of insulin: Secondary | ICD-10-CM

## 2023-08-05 LAB — CBC
HCT: 30.3 % — ABNORMAL LOW (ref 36.0–46.0)
Hemoglobin: 9.7 g/dL — ABNORMAL LOW (ref 12.0–15.0)
MCH: 32.3 pg (ref 26.0–34.0)
MCHC: 32 g/dL (ref 30.0–36.0)
MCV: 101 fL — ABNORMAL HIGH (ref 80.0–100.0)
Platelets: 311 10*3/uL (ref 150–400)
RBC: 3 MIL/uL — ABNORMAL LOW (ref 3.87–5.11)
RDW: 14.6 % (ref 11.5–15.5)
WBC: 8.8 10*3/uL (ref 4.0–10.5)
nRBC: 0 % (ref 0.0–0.2)

## 2023-08-05 LAB — RENAL FUNCTION PANEL
Albumin: 2.5 g/dL — ABNORMAL LOW (ref 3.5–5.0)
Anion gap: 13 (ref 5–15)
BUN: 71 mg/dL — ABNORMAL HIGH (ref 8–23)
CO2: 24 mmol/L (ref 22–32)
Calcium: 8.7 mg/dL — ABNORMAL LOW (ref 8.9–10.3)
Chloride: 93 mmol/L — ABNORMAL LOW (ref 98–111)
Creatinine, Ser: 5.01 mg/dL — ABNORMAL HIGH (ref 0.44–1.00)
GFR, Estimated: 8 mL/min — ABNORMAL LOW (ref >60)
Glucose, Bld: 319 mg/dL — ABNORMAL HIGH (ref 70–99)
Phosphorus: 3.5 mg/dL (ref 2.5–4.6)
Potassium: 3.9 mmol/L (ref 3.5–5.1)
Sodium: 130 mmol/L — ABNORMAL LOW (ref 135–145)

## 2023-08-05 LAB — GLUCOSE, CAPILLARY
Glucose-Capillary: 260 mg/dL — ABNORMAL HIGH (ref 70–99)
Glucose-Capillary: 369 mg/dL — ABNORMAL HIGH (ref 70–99)

## 2023-08-05 MED ORDER — LACTULOSE 10 GM/15ML PO SOLN: 30.0000 g | Freq: Two times a day (BID) | ORAL | 0 refills | Status: DC | PRN

## 2023-08-05 MED ORDER — INSULIN GLARGINE-YFGN 100 UNIT/ML ~~LOC~~ SOLN
20.0000 [IU] | Freq: Two times a day (BID) | SUBCUTANEOUS | Status: DC
  Administered 2023-08-05: 20 [IU] via SUBCUTANEOUS
  Filled 2023-08-05 (×3): qty 0.2

## 2023-08-05 MED ORDER — AMOXICILLIN 500 MG PO CAPS: 1000.0000 mg | ORAL_CAPSULE | Freq: Two times a day (BID) | ORAL | 0 refills | Status: AC

## 2023-08-05 MED ORDER — HEPARIN SODIUM (PORCINE) 1000 UNIT/ML DIALYSIS: 1000.0000 [IU] | INTRAMUSCULAR | Status: DC | PRN

## 2023-08-05 NOTE — TOC Transition Note (Signed)
Transition of Care [facility]) - CM/SW Discharge Note   Patient Details  Name: Heather Lopez MRN: [id number] Date of Birth: 27-Sep-1946  Transition of Care [facility]) CM/SW Contact:  Kreg Shropshire, RN Phone Number: 08/05/2023, 3:01 PM   Clinical Narrative:    Per previous note, [facility] services accept pt for PT/RN. Cm informed husband and pt that [facility] will be f/u with pt once d/c. Husband will provide transport home. TOC signing off.     Barriers to Discharge: Barriers Resolved   Patient Goals and CMS Choice CMS Medicare.gov Compare Post Acute Care list provided to:: Patient Choice offered to / list presented to : Patient  Discharge Placement                    Name of family member notified: Husband at bedside Patient and family notified of of transfer: 08/05/23  Discharge Plan and Services Additional resources added to the After Visit Summary for                            [facility] Arranged: RN, PT [facility] [place] Agency: [facility] Date HH Agency Contacted: 08/05/23 Time HH Agency Contacted: 1501 Representative spoke with at [facility] Agency: Feliberto Gottron  Social Determinants of Health (SDOH) Interventions SDOH Screenings   Food Insecurity: No Food Insecurity (07/31/2023)  Housing: Low Risk  (07/31/2023)  Transportation Needs: No Transportation Needs (07/31/2023)  Utilities: Not At Risk (07/31/2023)  Depression (PHQ2-9): Low Risk  (03/01/2022)  Financial Resource Strain: Low Risk  (07/14/2023)   Received from [facility]  Physical Activity: Unknown (07/14/2023)   Received from [facility]  Social Connections: Socially Integrated (07/14/2023)   Received from [facility]  Stress: No Stress Concern Present (07/14/2023)   Received from [facility]  Tobacco Use: Medium Risk (07/30/2023)     Readmission Risk Interventions    08/02/2023    3:12 PM 12/20/2021    1:52 PM 12/03/2021   11:26 AM  Readmission Risk Prevention Plan  Transportation Screening Complete Complete  Complete  PCP or Specialist Appt within 3-5 Days Complete    HRI or Home Care Consult Complete Complete   Social Work Consult for Recovery Care Planning/Counseling Complete Complete   Palliative Care Screening Not Applicable Complete   Medication Review [employer]ographer) Complete Complete Complete  PCP or Specialist appointment within 3-5 days of discharge   Complete  SW Recovery Care/Counseling Consult   Complete  Palliative Care Screening   Not Applicable  Skilled Nursing Facility   Complete

## 2023-08-05 NOTE — Progress Notes (Signed)
  Received patient in bed to unit.   Informed consent signed and in chart.    TX duration: 3hrs     Transported back to floor  Hand-off given to patient's nurse. No c/o and no distress noted    Access used: R HD Catheter  Access issues: nopne   Total UF removed: 1.0L Medication(s) given: none Post HD VS: 122/62 Post HD weight: 93.8kg     Lynann Beaver  [facility]

## 2023-08-05 NOTE — Evaluation (Signed)
Physical Therapy Evaluation Patient Details Name: LYNDELL WENCK MRN: [id number] DOB: 01-Feb-1946 Today's Date: 08/05/2023  History of Present Illness  Pt is a 77 y.o. female with medical history significant for end-stage renal disease on peritoneal dialysis, hypertension, gout, GERD, perforated duodenal ulcer, diabetes mellitus type 2, patient coming to [place] for abdominal pain that is been going on for about 7 days.  She was at her nephrology appointment today and had abdominal pain and peritoneal fluid collected was positive for it concerning for for peritonitis and patient was referred to the emergency room.  Clinical Impression   Pt is received in wheelchair with spouse at bedside, she is agreeable to PT session. Pt unable to perform mobility at this time due to increased fatigue following dialysis earlier today. Assessed BLE MMT 4/5 grossly and 4+/5 BUE grossly. Pt reports difficulty with standing activities due to fear of falling. Educated Pt on benefits behind PT and AD management to decrease risk for falls-Pt verbalized understanding. Pt able to perform seated LE exercises to promote LE strength. Plans to focus on standing activities and short distance amb using RW to promote independence and reduce fear component. Pt would benefit from skilled PT to address above deficits and promote optimal return to PLOF.        If plan is discharge home, recommend the following: A little help with walking and/or transfers;A little help with bathing/dressing/bathroom;Assist for transportation;Help with stairs or ramp for entrance   Can travel by private vehicle        Equipment Recommendations None recommended by PT  Recommendations for Other Services       Functional Status Assessment Patient has had a recent decline in their functional status and demonstrates the ability to make significant improvements in function in a reasonable and predictable amount of time.     Precautions / Restrictions  Precautions Precautions: Fall Restrictions Weight Bearing Restrictions: No      Mobility  Bed Mobility Overal bed mobility: Needs Assistance             General bed mobility comments: Not assessed at this time due to Pt in wheelchair at beginning of session    Transfers Overall transfer level: Needs assistance Equipment used: Rolling walker (2 wheels)               General transfer comment: Unable to formally assess but Pt and spouse reports Pt uses RW to transfer and assistance from spouse    Ambulation/Gait               General Gait Details: Not assessed during today's session due to increase fatigue; Pt is a household Doctor, hospital and wheelchair for long distances  Acupuncturist Bed    Modified Rankin (Stroke Patients Only)       Balance Overall balance assessment: Needs assistance Sitting-balance support: Feet supported, No upper extremity supported Sitting balance-Leahy Scale: Good Sitting balance - Comments: Able to maintain seated balance in wheelchair without relying on back support     Standing balance-Leahy Scale: Zero Standing balance comment: Unable to assess due to Pt being fatigued                             Pertinent Vitals/Pain Pain Assessment Pain Assessment: No/denies pain    Home Living Family/patient expects to be discharged to:: Private residence  Living Arrangements: Spouse/significant other Available Help at Discharge: Family;Available 24 hours/day Type of Home: House Home Access: Stairs to enter Entrance Stairs-Rails: None Entrance Stairs-Number of Steps: 1 small step Alternate Level Stairs-Number of Steps: 18 Home Layout: Able to live on main level with bedroom/bathroom;Two level Home Equipment: BSC/3in1;Rolling Walker (2 wheels);Rollator (4 wheels);Wheelchair - manual;Grab bars - toilet;Shower seat - built in      Prior Function Prior Level of  Function : Needs assist       Physical Assist : Mobility (physical) Mobility (physical): Gait;Transfers   Mobility Comments: amb short distances with RW/rollator and wheelchair for community amb; can self propel in wheelchair but spouse helps occasionally ADLs Comments: mod I/I with bathing/dressing/cooking/etc.     Extremity/Trunk Assessment   Upper Extremity Assessment Upper Extremity Assessment: Overall WFL for tasks assessed    Lower Extremity Assessment Lower Extremity Assessment: Overall WFL for tasks assessed       Communication   Communication Communication: No apparent difficulties Cueing Techniques: Verbal cues  Cognition Arousal: Alert Behavior During Therapy: WFL for tasks assessed/performed Overall Cognitive Status: Within Functional Limits for tasks assessed                                 General Comments: Pleasant and willing to work with PT although reports being tired        General Comments      Exercises Other Exercises Other Exercises: x10 seated knee extension, x10 seated marches, x10 seated heel raises   Assessment/Plan    PT Assessment Patient needs continued PT services  PT Problem List Decreased mobility;Decreased activity tolerance;Decreased balance       PT Treatment Interventions Gait training;Functional mobility training;Therapeutic activities;Therapeutic exercise;Balance training    PT Goals (Current goals can be found in the Care Plan section)  Acute Rehab PT Goals Patient Stated Goal: to go home and get stronger PT Goal Formulation: With patient Time For Goal Achievement: 08/19/23 Potential to Achieve Goals: Good    Frequency Min 1X/week     Co-evaluation               AM-PAC PT "6 Clicks" Mobility  Outcome Measure Help needed turning from your back to your side while in a flat bed without using bedrails?: A Little Help needed moving from lying on your back to sitting on the side of a flat bed  without using bedrails?: A Little Help needed moving to and from a bed to a chair (including a wheelchair)?: A Little Help needed standing up from a chair using your arms (e.g., wheelchair or bedside chair)?: A Little Help needed to walk in hospital room?: A Lot Help needed climbing 3-5 steps with a railing? : A Lot 6 Click Score: 16    End of Session   Activity Tolerance: Patient limited by fatigue Patient left: with family/visitor present;with nursing/sitter in room;Other (comment) Nurse Communication: Mobility status PT Visit Diagnosis: Unsteadiness on feet (R26.81);History of falling (Z91.81);Difficulty in walking, not elsewhere classified (R26.2)    Time: 4098-1191 PT Time Calculation (min) (ACUTE ONLY): 11 min   Charges:                 Elmon Else, SPT   Amily Depp 08/05/2023, 2:31 PM

## 2023-08-05 NOTE — Progress Notes (Signed)
[facility]  ROUNDING NOTE   Subjective:   [name] was admitted to [facility] on 07/30/2023 for Peritonitis [facility]) [K65.9] ESRD on peritoneal dialysis (HCC) [N18.6, Z99.2] Abdominal pain [R10.9]  Patient was sent to ED from [facility] due to a malfunctioning PD catheter.  Patient seen and evaluated during dialysis   HEMODIALYSIS FLOWSHEET:  Blood Flow Rate (mL/min): 0 mL/min Arterial Pressure (mmHg): -19.19 mmHg Venous Pressure (mmHg): 34.95 mmHg TMP (mmHg): -3.63 mmHg Ultrafiltration Rate (mL/min): 599 mL/min Dialysate Flow Rate (mL/min): 300 ml/min  Tolerating treatment.    Objective:  Vital signs in last 24 hours:  Temp:  [98.2 F (36.8 C)-98.9 F (37.2 C)] 98.2 F (36.8 C) (09/09 1209) Pulse Rate:  [70-83] 77 (09/09 1209) Resp:  [16-21] 19 (09/09 1209) BP: (122-153)/(56-83) 122/62 (09/09 1209) SpO2:  [93 %-97 %] 97 % (09/09 1209) Weight:  [94.9 kg-95.9 kg] 94.9 kg (09/09 1209)  Weight change:  Filed Weights   08/04/23 0447 08/05/23 0842 08/05/23 1209  Weight: 102 kg 95.9 kg 94.9 kg    Intake/Output: I/O last 3 completed shifts: In: 930 [P.O.:930] Out: -    Intake/Output this shift:  Total I/O In: -  Out: 1000 [Other:1000]  Physical Exam: General: NAD  Head: Normocephalic, atraumatic. Dry oral mucosal membranes  Eyes: Anicteric  Lungs:  Clear to auscultation, room air  Heart: Regular rate and rhythm  Abdomen:  Soft, tenderness  Extremities:  no peripheral edema.   Neurologic: Alert, moving all four extremities  Skin: No lesions  Access: Rt chest permcath placed on 08/02/23    Basic Metabolic Panel: Recent Labs  Lab 07/30/23 1731 07/31/23 0538 08/01/23 0524 08/02/23 2154 08/03/23 0349 08/05/23 0853  NA 133* 133* 131*  --  127* 130*  K 4.8 5.3* 4.6  --  5.3* 3.9  CL 99 102 100  --  96* 93*  CO2 20* 21* 19*  --  19* 24  GLUCOSE 232* 212* 135* 649* 550* 319*  BUN 63* 67* 64*  --  82* 71*  CREATININE 5.38* 5.53* 5.12*  --   5.70* 5.01*  CALCIUM 9.2 8.7* 8.7*  --  9.1 8.7*  PHOS  --   --  3.8  --  4.4 3.5    Liver Function Tests: Recent Labs  Lab 07/30/23 1731 07/31/23 0538 08/01/23 0524 08/03/23 0349 08/05/23 0853  AST 16 14*  --   --   --   ALT 26 20  --   --   --   ALKPHOS 103 94  --   --   --   BILITOT 0.6 0.7  --   --   --   PROT 7.8 7.0  --   --   --   ALBUMIN 3.4* 2.8* 2.6* 2.7* 2.5*   No results for input(s): "LIPASE", "AMYLASE" in the last 168 hours.  No results for input(s): "AMMONIA" in the last 168 hours.  CBC: Recent Labs  Lab 07/30/23 1731 07/31/23 0538 08/01/23 0524 08/03/23 0349 08/05/23 0853  WBC 14.0* 12.4* 9.0 8.5 8.8  NEUTROABS 11.5*  --   --   --   --   HGB 11.6* 10.2* 9.5* 9.6* 9.7*  HCT 37.7 31.6* 29.2* 29.6* 30.3*  MCV 105.0* 100.3* 99.0 100.7* 101.0*  PLT 408* 336 333 335 311    Cardiac Enzymes: No results for input(s): "CKTOTAL", "CKMB", "CKMBINDEX", "TROPONINI" in the last 168 hours.  BNP: Invalid input(s): "POCBNP"  CBG: Recent Labs  Lab 08/04/23 0816 08/04/23  1156 08/04/23 1639 08/04/23 2045 08/05/23 0825  GLUCAP 283* 356* 303* 361* 260*    Microbiology: Results for orders placed or performed during the hospital encounter of 07/30/23  Culture, blood (routine x 2)     Status: None   Collection Time: 07/30/23  5:31 PM   Specimen: BLOOD  Result Value Ref Range Status   Specimen Description BLOOD RIGHT ANTECUBITAL  Final   Special Requests   Final    BOTTLES DRAWN AEROBIC AND ANAEROBIC Blood Culture adequate volume   Culture   Final    NO GROWTH 5 DAYS Performed at [facility], [address] [place], [place] [address]    Report Status 08/04/2023 FINAL  Final  Culture, blood (routine x 2)     Status: None   Collection Time: 07/30/23  7:00 PM   Specimen: BLOOD  Result Value Ref Range Status   Specimen Description BLOOD BLOOD LEFT FOREARM  Final   Special Requests   Final    BOTTLES DRAWN AEROBIC AND ANAEROBIC Blood Culture  results may not be optimal due to an inadequate volume of blood received in culture bottles   Culture   Final    NO GROWTH 5 DAYS Performed at [facility], [address] [place], [place] [address]    Report Status 08/04/2023 FINAL  Final  Urine Culture     Status: Abnormal   Collection Time: 07/30/23  7:43 PM   Specimen: Urine, Random  Result Value Ref Range Status   Specimen Description   Final    URINE, RANDOM Performed at [facility], [address], [place], [place] [address]    Special Requests   Final    NONE Reflexed from [id number] Performed at [facility], [address] [place], [place] [address]    Culture (A)  Final    <10,000 COLONIES/mL INSIGNIFICANT GROWTH Performed at [facility], [address] [place], [place] [address]    Report Status 08/01/2023 FINAL  Final    Coagulation Studies: No results for input(s): "LABPROT", "INR" in the last 72 hours.  Urinalysis: No results for input(s): "COLORURINE", "LABSPEC", "PHURINE", "GLUCOSEU", "HGBUR", "BILIRUBINUR", "KETONESUR", "PROTEINUR", "UROBILINOGEN", "NITRITE", "LEUKOCYTESUR" in the last 72 hours.  Invalid input(s): "APPERANCEUR"     Imaging: No results found.   Medications:      amoxicillin  1,000 mg Oral Q12H   atorvastatin  20 mg Oral Daily   buPROPion  150 mg Oral Daily   busPIRone  5 mg Oral Daily   Chlorhexidine Gluconate Cloth  6 each Topical Daily   cloNIDine  0.1 mg Oral BID   heparin  5,000 Units Subcutaneous Q12H   insulin aspart  0-5 Units Subcutaneous QHS   insulin aspart  0-9 Units Subcutaneous TID WC   insulin aspart  5 Units Subcutaneous TID WC   insulin glargine-yfgn  20 Units Subcutaneous BID   lactulose  30 g Oral BID   metoprolol tartrate  100 mg Oral BID   patiromer  8.4 g Oral Daily   sodium bicarbonate  650 mg Oral BID   acetaminophen **OR** acetaminophen, alteplase, heparin, morphine injection  Assessment/ Plan:  [name] is  a 77 y.o.  female with end stage renal disease on peritoneal dialysis, hypertension, GERD, gout, hyeprlipidemia, diabetes mellitus type II and vertigo who presents to [facility] on 07/30/2023 for Peritonitis (HCC) [K65.9] ESRD on peritoneal dialysis (HCC) [N18.6, Z99.2] Abdominal pain [R10.9]  End Stage Renal Disease: just started  peritoneal dialysis training.  - Appreciate vascular surgery placing permcath on 08/01/23 - Appreciate general surgery removing PD catheter on 08/02/23 - Receiving dialysis today, UF 1L  - Next treatment scheduled for Tuesday, per new outpatient schedule    Abdominal pain: multifactorial: peritonitis vs constipation Unable to obtain PD sample.PD catheter removed. Reports small BM, continue bowel regimen.  Hypertension with chronic kidney disease: blood pressure currently at goal.  - Current regimen of clonidine and metoprolol. - Blood pressure stable  Diabetes mellitus type II with chronic kidney disease: insulin dependent. Novolog and Lantus outpatient.   Anemia with chronic kidney disease: macrocytic. Was getting ESA and IV iron as outpatient.  -Hgb 9.7  6.  CT of the abdomen pelvis without contrast shows mild to moderate right hydroureteronephrosis with right ureteral stent, mesenteric edema.  Urologist has evaluated the scan and the condition is deemed chronic but stable.  Stent was placed in February.  Urology will follow outpatient and schedule stent exchange.    LOS: 6   9/9/202412:34 PM

## 2023-08-05 NOTE — Inpatient Diabetes Management (Signed)
Inpatient Diabetes Program Recommendations  AACE/ADA: New Consensus Statement on Inpatient Glycemic Control   Target Ranges:  Prepandial:   less than 140 mg/dL      Peak postprandial:   less than 180 mg/dL (1-2 hours)      Critically ill patients:  140 - 180 mg/dL    Latest Reference Range & Units 08/04/23 08:16 08/04/23 11:56 08/04/23 16:39 08/04/23 20:45 08/05/23 08:25  Glucose-Capillary 70 - 99 mg/dL 865 (H) 784 (H) 696 (H) 361 (H) 260 (H)   Review of Glycemic Control  Diabetes history: DM2 Outpatient Diabetes medications: Lantus 60 units QAM, Novolog 10 units TID with meals, Trulicity 1.5 mg Qweek (not taking) Current orders for Inpatient glycemic control: Semglee 18 units BID, Novolog 5 units TID with meals, Novolog 0-9 units TID with meals, Novolog 0-5 units QHS  Inpatient Diabetes Program Recommendations:    Insulin: Please consider increasing Semglee to 23 units BID.  NOTE: Patient admitted with bacterial peritonitis on 07/30/23.  Per chart review, patient sees Dr. Tedd Sias (Endocrinologist) and was last seen 11/27/22. Per office note on 11/27/22, patient was instructed to use of Trulicity and start Ozempic 2 mg Qweek, continue Lantus 70 units QPM, and Novolog 10 units TID with meals.  Thanks, Orlando Penner, RN, MSN, CDCES Diabetes Coordinator Inpatient Diabetes Program [phone] (Team Pager from 8am to 5pm)

## 2023-08-05 NOTE — Discharge Summary (Signed)
Physician Discharge Summary   Patient: Heather Lopez MRN: [id number] DOB: [birth year]  Admit date:     07/30/2023  Discharge date: 08/05/23  Discharge Physician: Arnetha Courser   PCP: Carren Rang, PA-C   Recommendations at discharge:  Please obtain CBC and BMP on follow-up Patient will start outpatient dialysis on Thursday, 08/08/2023 Please ensure the completion of total of 3 weeks of antibiotic. Follow-up with primary care provider  Discharge Diagnoses: Principal Problem:   SBP (spontaneous bacterial peritonitis) (HCC) Active Problems:   Abdominal pain   Hyperkalemia   ESRD on peritoneal dialysis (HCC)   T2DM (type 2 diabetes mellitus) (HCC)   Essential hypertension   Decubitus ulcer of sacral region, stage 1   Obesity (BMI 30-39.9)   Peritonitis [facility])   Hospital Course: Taken from H&P.   Heather Lopez is a 77 y.o. female with medical history significant for end-stage renal disease on peritoneal dialysis, hypertension, gout, GERD, perforated duodenal ulcer, diabetes mellitus type 2, patient coming to [place] for abdominal pain that is been going on for about 7 days.  She was at her nephrology appointment today and had abdominal pain and peritoneal fluid collected was positive for it concerning for for peritonitis and patient was referred to the emergency room.  On initial presentation patient has mildly elevated blood pressure, afebrile but later developed fever at 101.9.  Labs pertinent for leukocytosis at 14, glucose of 232, renal function consistent with ESRD. CT abdomen and pelvis with no acute abnormalities, similar mild to moderate right hydronephrosis about the right ureteral stent with periureteral fat stranding.  Small volume abdominopelvic ascites with PD catheter in place. Moderate to large stool burden in the right colon and mild skin thickening and stranding in the anterior abdominal wall, correlate for cellulitis.  Patient was started on cefepime and vancomycin.  9/4: Some  improvement in leukocytosis.  Apparently dialysis nurse was unable to drain dialysis fluid due to increased thickness despite using tPA.  IR was consulted for drainage, repeat ultrasound with no significant amount of fluid which can be drained by IR.  Mild hyperkalemia at 5.3-starting Veltassa Significant stool burden which can be contributory to her symptoms-giving soapsuds enema.  Nephrology would like to continue with broad-spectrum antibiotics for now.  9/5: Permanent HD catheter was placed by vascular surgery today.  PD catheter to be removed by general surgery likely tomorrow.  Hemodialysis will be started from tomorrow. Patient need to follow-up with outpatient urology regarding chronic hydronephrosis which seems stable, patient had stents placed in February and will need either exchange or removal.  9/6: Cultures from peritoneal fluid drawn at nephrology office with Enterococcus faecalis resistant to vancomycin.  Antibiotics switched with amoxicillin at 1 g twice daily p.o. for 3 weeks.  PD catheter was removed by general surgery.  Need outpatient dialysis chair before discharge.  9/7: CBG significantly elevated this morning, requiring extra NovoLog, apparently she was eating rice crispy treat overnight.  Had her HD done today, will need outpatient chair before discharge.  9/8: CBG remained elevated, increasing Semglee to 18 units twice daily and mealtime coverage to 5 unit.  Awaiting outpatient confirmation of dialysis.  9/9: Hemodynamically stable.  Now had an outpatient dialysis chair starting from Thursday.  She is being discharged home to start her routine outpatient dialysis on 08/08/2023.  Patient is given amoxicillin for 20 more days to complete a 3-week course for concern of bacterial peritonitis based on culture results.  Patient will continue with rest of her home  medications and need to have a close follow-up with her providers for further recommendations.  Assessment and Plan: *  SBP (spontaneous bacterial peritonitis) (HCC) Patient met sepsis criteria with fever and leukocytosis likely secondary to SBP. Malfunctioning PD catheter which was removed today by general surgery and patient was started on hemodialysis. Initial cultures with Enterococcus faecalis resistant to vancomycin -Started on p.o. amoxicillin 1 g twice daily for 3 weeks according to susceptibility result -Supportive care  Abdominal pain PRN morphine.  Suspect 2/2 PD catheter dysfunction and SBP Ct ABD negative for perforation or colitis.   Hyperkalemia Resolved with Veltassa. -Continue to monitor  ESRD on peritoneal dialysis [facility]) Patient was on peritoneal dialysis, malfunctioning PD catheter so a permanent HD catheter was placed  by vascular surgery.  PD catheter removed -Patient to start on hemodialysis -Nephrology is on board -Will need outpatient chair before discharge  T2DM (type 2 diabetes mellitus) (HCC) CBG elevated.  Patient was on Trulicity, 60 units of Lantus and NovoLog at home.  Patient required diabetes education as she was eating a lot of sugary stuff causing elevated CBG requiring extra NovoLog -Increased Semglee to 18 units twice daily -SSI  -Increasing mealtime coverage   Essential hypertension Continue patient on metoprolol and clonidine.  Decubitus ulcer of sacral region, stage 1 Stage stage I bilateral erythema on both the buttocks.  No wound documented and reported by patient's nurse.   Wound care skin care.  Barrier protection.  Obesity (BMI 30-39.9) Estimated body mass index is 38.97 kg/m as calculated from the following:   Height as of this encounter: 5\' 3"  (1.6 m).   Weight as of this encounter: 99.8 kg.   -This will complicate overall prognosis       Consultants: Nephrology.  Vascular surgery.  General surgery Procedures performed: Permanent HD catheter placement, hemodialysis, removal of PD catheter Disposition: Home health Diet recommendation:   Discharge Diet Orders (From admission, onward)     Start     Ordered   08/05/23 0000  Diet - low sodium heart healthy        08/05/23 1444           Cardiac and Carb modified diet DISCHARGE MEDICATION: Allergies as of 08/05/2023       Reactions   Contrast Media  [iodinated Contrast Media] Anaphylaxis   Iodine Swelling   (IV only) - angioedema        Medication List     STOP taking these medications    cephALEXin 500 MG capsule Commonly known as: KEFLEX   Premarin vaginal cream Generic drug: conjugated estrogens   Trulicity 1.5 MG/0.5ML Sopn Generic drug: Dulaglutide       TAKE these medications    amoxicillin 500 MG capsule Commonly known as: AMOXIL Take 2 capsules (1,000 mg total) by mouth every 12 (twelve) hours for 20 days.   aspirin EC 81 MG tablet Take 81 mg by mouth daily.   atorvastatin 20 MG tablet Commonly known as: LIPITOR Take 20 mg by mouth daily.   buPROPion 150 MG 24 hr tablet Commonly known as: WELLBUTRIN XL Take 150 mg by mouth daily.   busPIRone 5 MG tablet Commonly known as: BUSPAR Take 5 mg by mouth daily.   cloNIDine 0.1 MG tablet Commonly known as: CATAPRES Take 1 tablet (0.1 mg total) by mouth 2 (two) times daily.   ferrous sulfate 325 (65 FE) MG tablet Take 325 mg by mouth daily with breakfast.   insulin aspart 100 UNIT/ML injection Commonly  known as: novoLOG Inject 10 Units into the skin 3 (three) times daily before meals.   insulin glargine 100 UNIT/ML injection Commonly known as: LANTUS Inject 60 Units into the skin daily.   lactulose 10 GM/15ML solution Commonly known as: CHRONULAC Take 45 mLs (30 g total) by mouth 2 (two) times daily as needed for mild constipation or moderate constipation.   lansoprazole 30 MG capsule Commonly known as: PREVACID Take 30 mg by mouth at bedtime.   loperamide 2 MG tablet Commonly known as: IMODIUM A-D Take 2 mg by mouth every 6 (six) hours as needed for diarrhea or loose  stools.   metoprolol tartrate 100 MG tablet Commonly known as: LOPRESSOR Take 1 tablet (100 mg total) by mouth 2 (two) times daily.   multivitamin with minerals Tabs tablet Take 1 tablet by mouth daily.   ondansetron 4 MG tablet Commonly known as: ZOFRAN Take 4 mg by mouth every 8 (eight) hours as needed.   polyethylene glycol 17 g packet Commonly known as: MIRALAX / GLYCOLAX Take 17 g by mouth daily as needed for mild constipation or moderate constipation.   senna-docusate 8.6-50 MG tablet Commonly known as: Senokot-S Take 1 tablet by mouth at bedtime as needed for mild constipation or moderate constipation.   sodium bicarbonate 650 MG tablet Take 650 mg by mouth 2 (two) times daily.   trospium 20 MG tablet Commonly known as: SANCTURA Take 1 tablet (20 mg total) by mouth 2 (two) times daily.   Veltassa 8.4 g packet Generic drug: patiromer Take 8.4 g by mouth daily. MIX 1 PACKET IN 1/3 CUP OF WATER AND DRINK BY MOUTH DAILY.               Discharge Care Instructions  (From admission, onward)           Start     Ordered   08/05/23 0000  No dressing needed        08/05/23 1444            Follow-up Information     Carren Rang, PA-C. Schedule an appointment as soon as possible for a visit in 1 week(s).   Specialty: Physician Assistant Contact information: [address] [place] [address] [phone]                Discharge Exam: Filed Weights   08/05/23 9147 08/05/23 1209 08/05/23 1234  Weight: 95.9 kg 94.9 kg 93.8 kg   General.  Obese lady, in no acute distress. Pulmonary.  Lungs clear bilaterally, normal respiratory effort. CV.  Regular rate and rhythm, no JVD, rub or murmur. Abdomen.  Soft, nontender, nondistended, BS positive. CNS.  Alert and oriented .  No focal neurologic deficit. Extremities.  No edema, no cyanosis, pulses intact and symmetrical. Psychiatry.  Judgment and insight appears normal.    Condition at discharge: stable  The results of significant diagnostics from this hospitalization (including imaging, microbiology, ancillary and laboratory) are listed below for reference.   Imaging Studies: PERIPHERAL VASCULAR CATHETERIZATION  Result Date: 08/01/2023 See surgical note for result.  [place] ASCITES (ABDOMEN LIMITED)  Result Date: 07/31/2023 CLINICAL DATA:  [id number] Ascites [id number] EXAM: LIMITED ABDOMEN ULTRASOUND FOR ASCITES TECHNIQUE: Limited ultrasound survey for ascites was performed in all four abdominal quadrants. COMPARISON:  None Available. FINDINGS: Limited sonographic exam of the abdominal 4 quadrants was performed for fluid assessment. Images demonstrate trace free fluid, not amenable to safe image guided drainage. IMPRESSION: Trace free fluid noted in the  abdomen. Fluid volume is insufficient for safe image guided drainage. Electronically Signed   By: Olive Bass M.D.   On: 07/31/2023 12:40   CT ABDOMEN PELVIS WO CONTRAST  Result Date: 07/30/2023 CLINICAL DATA:  Acute nonlocalized abdominal pain EXAM: CT ABDOMEN AND PELVIS WITHOUT CONTRAST TECHNIQUE: Multidetector CT imaging of the abdomen and pelvis was performed following the standard protocol without IV contrast. RADIATION DOSE REDUCTION: This exam was performed according to the departmental dose-optimization program which includes automated exposure control, adjustment of the mA and/or kV according to patient size and/or use of iterative reconstruction technique. COMPARISON:  CT abdomen and pelvis 07/26/2023 FINDINGS: Lower chest: Bibasilar atelectasis/scarring. Hepatobiliary: Unremarkable liver. Normal gallbladder. No biliary dilation. Pancreas: Unremarkable. Spleen: Unremarkable. Adrenals/Urinary Tract: Normal adrenal glands. Similar mild-to-moderate right hydroureteronephrosis about the right ureteral stent. Periureteral fat stranding is similar. Unchanged mild ectasia of the left renal collecting system and ureter.  Nonobstructing stone or parenchymal calcification lower pole of the right kidney. No ureteral stones. Unremarkable bladder. Stomach/Bowel: Normal caliber large and small bowel. Moderate to large stool burden in the right colon. No bowel wall thickening. The appendix measures 7 mm diameter. There is mild fluid and stranding about the appendix however this is favored due to mesenteric edema/peritoneum dialysis.Stomach is within normal limits. Vascular/Lymphatic: Aortic atherosclerosis. No enlarged abdominal or pelvic lymph nodes. Reproductive: Unremarkable. Other: Peritoneal dialysis catheter. Mild mesenteric edema. Small volume free fluid about the liver, in the pelvis and pericolic gutters. Small locule of free air in the low central abdomen adjacent to the dialysis catheter. Similar skin thickening and mild stranding in the anterior abdominal wall. Musculoskeletal: No acute fracture. Advanced arthritis in the right-greater-than-left hips. IMPRESSION: 1. Similar mild-to-moderate right hydroureteronephrosis about the right ureteral stent with periureteral fat stranding. 2. Peritoneal dialysis catheter with small volume abdominopelvic ascites. 3. There is mild stranding and free fluid about the appendix which is normal in caliber. This is favored due to mesenteric edema and peritoneal dialysis. 4. Moderate to large stool burden in the right colon. Correlate for constipation. 5. Mild skin thickening and stranding in the anterior abdominal wall. Correlate for cellulitis. Aortic Atherosclerosis (ICD10-I70.0). Electronically Signed   By: Minerva Fester M.D.   On: 07/30/2023 22:35   CT ABDOMEN PELVIS WO CONTRAST  Result Date: 07/26/2023 CLINICAL DATA:  Abdominal pain. Just starting peritoneal dialysis. Episode of fevers and chills EXAM: CT ABDOMEN AND PELVIS WITHOUT CONTRAST TECHNIQUE: Multidetector CT imaging of the abdomen and pelvis was performed following the standard protocol without IV contrast. RADIATION DOSE  REDUCTION: This exam was performed according to the departmental dose-optimization program which includes automated exposure control, adjustment of the mA and/or kV according to patient size and/or use of iterative reconstruction technique. COMPARISON:  CT 03/08/2022 older exams as well FINDINGS: Lower chest: There is some linear opacity lung bases likely scar or atelectasis. No pleural effusion. Trace pericardial effusion. Coronary artery calcifications are seen. Hepatobiliary: On this non IV contrast exam, grossly preserved hepatic parenchyma. Gallbladder has some high density material dependently. Possible stones or sludge. The gallbladder is nondilated Pancreas: Unremarkable. No pancreatic ductal dilatation or surrounding inflammatory changes. Spleen: Normal in size without focal abnormality. Adrenals/Urinary Tract: The adrenal glands are preserved moderate atrophy of the left kidney with some punctate calcification superiorly. Perinephric stranding. Mild ectasia of the left renal collecting system down to the bladder. No ureteral stones right-sided perinephric stranding identified. There is some urothelial thickening of the renal pelvis with dilatation the level of the dilatation is decreased  from the prior examination from 2023 but there is an indwelling ureteral stent in place. Proximal pigtail in the upper anterior right kidney and distal just into the bladder at the UVJ. No adjacent ureteral stones next of the stent clearly seen. Nonobstructing small 5 mm lower pole right-sided renal stone bladder is mildly distended. Mild wall thickening with the adjacent stranding. Please correlate for any evidence of cystitis. Stomach/Bowel: On this non oral contrast exam, the large bowel has a normal course and caliber with scattered stool normal appendix extends posterior to the cecum in the anterior right hemipelvis. Stomach is underdistended small bowel is nondilated Vascular/Lymphatic: Diffuse vascular  calcifications identified including the aorta and branch vessels. Specific calcifications along small caliber vessels as well. Normal caliber IVC. No specific abnormal lymph node enlargement identified in the abdomen and pelvis Reproductive: Lobular uterus with calcifications. No separate adnexal mass Other: Peritoneal dialysis catheter in place. This enters the anterior pelvic wall in has the pigtail extending into the central pelvic mesentery. There is a small amount of adjacent fluid and a few bubbles of air there is skin thickening identified along the anterior abdominal wall with some stranding. Please correlate for any clinical findings such as cellulitis. No soft tissue gas Musculoskeletal: Moderate degenerative changes along the spine with multilevel disc bulging, osteophytes and stenosis. There is some retrolisthesis seen of L2 on L3. Advanced degenerative changes seen of the hips as well IMPRESSION: Peritoneal dialysis catheter in place with the pigtail along the central pelvic mesentery with some mild adjacent fluid, stranding and air no loculated fluid collections or frank ascites No bowel obstruction. Scattered stool. Normal appendix. Few colonic diverticula. Indwelling right-sided ureteral stent. There is some mild ectasia of the right renal collecting system but decreased from the previous examination. There is significant stranding adjacent to the renal pelvis and proximal ureter with some urothelial thickening. Please correlate for any clinical signs of infection or other process. The bladder itself does have wall thickening and stranding as well. Bilateral nonobstructing renal stones. No ureteral stones including adjacent to the ureteral stent. Gallstones or sludge in the nondilated gallbladder. Skin thickening along the anterior pelvic wall with some stranding. No soft tissue gas please correlate for any soft tissue abnormality or infection Electronically Signed   By: Karen Kays M.D.   On:  07/26/2023 16:05    Microbiology: Results for orders placed or performed during the hospital encounter of 07/30/23  Culture, blood (routine x 2)     Status: None   Collection Time: 07/30/23  5:31 PM   Specimen: BLOOD  Result Value Ref Range Status   Specimen Description BLOOD RIGHT ANTECUBITAL  Final   Special Requests   Final    BOTTLES DRAWN AEROBIC AND ANAEROBIC Blood Culture adequate volume   Culture   Final    NO GROWTH 5 DAYS Performed at [facility], [address] [place], [place] [address]    Report Status 08/04/2023 FINAL  Final  Culture, blood (routine x 2)     Status: None   Collection Time: 07/30/23  7:00 PM   Specimen: BLOOD  Result Value Ref Range Status   Specimen Description BLOOD BLOOD LEFT FOREARM  Final   Special Requests   Final    BOTTLES DRAWN AEROBIC AND ANAEROBIC Blood Culture results may not be optimal due to an inadequate volume of blood received in culture bottles   Culture   Final    NO GROWTH 5 DAYS Performed at [facility],  [address] [place], [place] [address]    Report Status 08/04/2023 FINAL  Final  Urine Culture     Status: Abnormal   Collection Time: 07/30/23  7:43 PM   Specimen: Urine, Random  Result Value Ref Range Status   Specimen Description   Final    URINE, RANDOM Performed at [facility], [address] [place], [place] [address]    Special Requests   Final    NONE Reflexed from [phone] Performed at [facility], [address] [place], [place] [address]    Culture (A)  Final    <10,000 COLONIES/mL INSIGNIFICANT GROWTH Performed at [facility], [address], [place], [place] [address]    Report Status 08/01/2023 FINAL  Final    Labs: CBC: Recent Labs  Lab 07/30/23 1731 07/31/23 0538 08/01/23 0524 08/03/23 0349 08/05/23 0853  WBC 14.0* 12.4* 9.0 8.5 8.8  NEUTROABS 11.5*  --   --   --   --   HGB 11.6* 10.2* 9.5* 9.6* 9.7*  HCT 37.7 31.6* 29.2* 29.6* 30.3*   MCV 105.0* 100.3* 99.0 100.7* 101.0*  PLT 408* 336 333 335 311   Basic Metabolic Panel: Recent Labs  Lab 07/30/23 1731 07/31/23 0538 08/01/23 0524 08/02/23 2154 08/03/23 0349 08/05/23 0853  NA 133* 133* 131*  --  127* 130*  K 4.8 5.3* 4.6  --  5.3* 3.9  CL 99 102 100  --  96* 93*  CO2 20* 21* 19*  --  19* 24  GLUCOSE 232* 212* 135* 649* 550* 319*  BUN 63* 67* 64*  --  82* 71*  CREATININE 5.38* 5.53* 5.12*  --  5.70* 5.01*  CALCIUM 9.2 8.7* 8.7*  --  9.1 8.7*  PHOS  --   --  3.8  --  4.4 3.5   Liver Function Tests: Recent Labs  Lab 07/30/23 1731 07/31/23 0538 08/01/23 0524 08/03/23 0349 08/05/23 0853  AST 16 14*  --   --   --   ALT 26 20  --   --   --   ALKPHOS 103 94  --   --   --   BILITOT 0.6 0.7  --   --   --   PROT 7.8 7.0  --   --   --   ALBUMIN 3.4* 2.8* 2.6* 2.7* 2.5*   CBG: Recent Labs  Lab 08/04/23 0816 08/04/23 1156 08/04/23 1639 08/04/23 2045 08/05/23 0825  GLUCAP 283* 356* 303* 361* 260*    Discharge time spent: greater than 30 minutes.  This record has been created using Conservation officer, historic buildings. Errors have been sought and corrected,but may not always be located. Such creation errors do not reflect on the standard of care.   Signed: Arnetha Courser, MD [facility] 08/05/2023

## 2023-08-05 NOTE — Plan of Care (Signed)
  Problem: Education: Goal: Ability to describe self-care measures that may prevent or decrease complications (Diabetes Survival Skills Education) will improve Outcome: Progressing   

## 2023-08-05 NOTE — Care Management Important Message (Signed)
Important Message  Patient Details  Name: Heather Lopez MRN: [id number] Date of Birth: 07-Sep-1946   Medicare Important Message Given:  Yes  Patient out of room upon time of visit, no family in room. Copy of Medicare IM left on bedside tray for reference.    Johnell Comings 08/05/2023, 11:17 AM

## 2023-08-21 NOTE — Telephone Encounter (Signed)
Left message to have pt return call to the office to schedule appt to discuss stent exchange.

## 2023-08-26 ENCOUNTER — Telehealth: Payer: Self-pay | Admitting: Urology

## 2023-08-26 NOTE — Telephone Encounter (Signed)
Pt returned your call from last week asking about having her stent removed.

## 2023-08-27 DIAGNOSIS — S065XAA Traumatic subdural hemorrhage with loss of consciousness status unknown, initial encounter: Secondary | ICD-10-CM

## 2023-08-27 HISTORY — DX: Traumatic subdural hemorrhage with loss of consciousness status unknown, initial encounter: S06.5XAA

## 2023-09-03 NOTE — Telephone Encounter (Signed)
Spoke with patient and advised results Appt scheduled 

## 2023-09-17 ENCOUNTER — Emergency Department: Payer: Medicare Other

## 2023-09-17 ENCOUNTER — Other Ambulatory Visit: Payer: Self-pay

## 2023-09-17 ENCOUNTER — Inpatient Hospital Stay
Admission: EM | Admit: 2023-09-17 | Discharge: 2023-09-22 | DRG: 689 | Disposition: A | Discharge status: Home Health | Payer: Medicare Other | Attending: Internal Medicine | Admitting: Internal Medicine

## 2023-09-17 DIAGNOSIS — B961 Klebsiella pneumoniae [K. pneumoniae] as the cause of diseases classified elsewhere: Secondary | ICD-10-CM | POA: Diagnosis present

## 2023-09-17 DIAGNOSIS — E11649 Type 2 diabetes mellitus with hypoglycemia without coma: Secondary | ICD-10-CM | POA: Diagnosis not present

## 2023-09-17 DIAGNOSIS — F419 Anxiety disorder, unspecified: Secondary | ICD-10-CM | POA: Diagnosis present

## 2023-09-17 DIAGNOSIS — Z79899 Other long term (current) drug therapy: Secondary | ICD-10-CM

## 2023-09-17 DIAGNOSIS — G9340 Encephalopathy, unspecified: Secondary | ICD-10-CM | POA: Insufficient documentation

## 2023-09-17 DIAGNOSIS — S0990XA Unspecified injury of head, initial encounter: Secondary | ICD-10-CM

## 2023-09-17 DIAGNOSIS — E871 Hypo-osmolality and hyponatremia: Secondary | ICD-10-CM

## 2023-09-17 DIAGNOSIS — E785 Hyperlipidemia, unspecified: Secondary | ICD-10-CM | POA: Diagnosis present

## 2023-09-17 DIAGNOSIS — J449 Chronic obstructive pulmonary disease, unspecified: Secondary | ICD-10-CM | POA: Diagnosis present

## 2023-09-17 DIAGNOSIS — E119 Type 2 diabetes mellitus without complications: Secondary | ICD-10-CM

## 2023-09-17 DIAGNOSIS — N390 Urinary tract infection, site not specified: Principal | ICD-10-CM | POA: Diagnosis present

## 2023-09-17 DIAGNOSIS — K219 Gastro-esophageal reflux disease without esophagitis: Secondary | ICD-10-CM | POA: Diagnosis present

## 2023-09-17 DIAGNOSIS — Z9841 Cataract extraction status, right eye: Secondary | ICD-10-CM

## 2023-09-17 DIAGNOSIS — S065XAA Traumatic subdural hemorrhage with loss of consciousness status unknown, initial encounter: Secondary | ICD-10-CM

## 2023-09-17 DIAGNOSIS — Z823 Family history of stroke: Secondary | ICD-10-CM

## 2023-09-17 DIAGNOSIS — Z833 Family history of diabetes mellitus: Secondary | ICD-10-CM

## 2023-09-17 DIAGNOSIS — E876 Hypokalemia: Secondary | ICD-10-CM | POA: Diagnosis present

## 2023-09-17 DIAGNOSIS — I10 Essential (primary) hypertension: Secondary | ICD-10-CM | POA: Diagnosis present

## 2023-09-17 DIAGNOSIS — E222 Syndrome of inappropriate secretion of antidiuretic hormone: Secondary | ICD-10-CM | POA: Diagnosis present

## 2023-09-17 DIAGNOSIS — E1122 Type 2 diabetes mellitus with diabetic chronic kidney disease: Secondary | ICD-10-CM | POA: Diagnosis present

## 2023-09-17 DIAGNOSIS — L89151 Pressure ulcer of sacral region, stage 1: Secondary | ICD-10-CM | POA: Diagnosis present

## 2023-09-17 DIAGNOSIS — I700 Atherosclerosis of aorta: Secondary | ICD-10-CM | POA: Diagnosis present

## 2023-09-17 DIAGNOSIS — N186 End stage renal disease: Secondary | ICD-10-CM

## 2023-09-17 DIAGNOSIS — Z794 Long term (current) use of insulin: Secondary | ICD-10-CM | POA: Diagnosis not present

## 2023-09-17 DIAGNOSIS — Z87891 Personal history of nicotine dependence: Secondary | ICD-10-CM

## 2023-09-17 DIAGNOSIS — G9341 Metabolic encephalopathy: Secondary | ICD-10-CM | POA: Diagnosis present

## 2023-09-17 DIAGNOSIS — Z66 Do not resuscitate: Secondary | ICD-10-CM | POA: Diagnosis present

## 2023-09-17 DIAGNOSIS — W19XXXA Unspecified fall, initial encounter: Secondary | ICD-10-CM | POA: Diagnosis present

## 2023-09-17 DIAGNOSIS — M109 Gout, unspecified: Secondary | ICD-10-CM | POA: Diagnosis present

## 2023-09-17 DIAGNOSIS — I120 Hypertensive chronic kidney disease with stage 5 chronic kidney disease or end stage renal disease: Secondary | ICD-10-CM | POA: Diagnosis present

## 2023-09-17 DIAGNOSIS — R4182 Altered mental status, unspecified: Secondary | ICD-10-CM | POA: Insufficient documentation

## 2023-09-17 DIAGNOSIS — E669 Obesity, unspecified: Secondary | ICD-10-CM | POA: Diagnosis present

## 2023-09-17 DIAGNOSIS — Z91041 Radiographic dye allergy status: Secondary | ICD-10-CM

## 2023-09-17 DIAGNOSIS — E1165 Type 2 diabetes mellitus with hyperglycemia: Secondary | ICD-10-CM | POA: Diagnosis present

## 2023-09-17 DIAGNOSIS — F32A Depression, unspecified: Secondary | ICD-10-CM | POA: Diagnosis present

## 2023-09-17 DIAGNOSIS — Z9842 Cataract extraction status, left eye: Secondary | ICD-10-CM

## 2023-09-17 DIAGNOSIS — Z992 Dependence on renal dialysis: Secondary | ICD-10-CM

## 2023-09-17 DIAGNOSIS — D631 Anemia in chronic kidney disease: Secondary | ICD-10-CM | POA: Diagnosis present

## 2023-09-17 DIAGNOSIS — E162 Hypoglycemia, unspecified: Principal | ICD-10-CM

## 2023-09-17 DIAGNOSIS — Z7982 Long term (current) use of aspirin: Secondary | ICD-10-CM

## 2023-09-17 DIAGNOSIS — Y92009 Unspecified place in unspecified non-institutional (private) residence as the place of occurrence of the external cause: Secondary | ICD-10-CM | POA: Diagnosis not present

## 2023-09-17 DIAGNOSIS — R319 Hematuria, unspecified: Secondary | ICD-10-CM | POA: Diagnosis present

## 2023-09-17 LAB — URINALYSIS, ROUTINE W REFLEX MICROSCOPIC
Bacteria, UA: NONE SEEN
Bilirubin Urine: NEGATIVE
Glucose, UA: NEGATIVE mg/dL
Ketones, ur: NEGATIVE mg/dL
Nitrite: NEGATIVE
Protein, ur: 100 mg/dL — AB
Specific Gravity, Urine: 1.006 (ref 1.005–1.030)
Squamous Epithelial / HPF: 0 /[HPF] (ref 0–5)
WBC, UA: >50 WBC/hpf (ref 0–5)
pH: 8 (ref 5.0–8.0)

## 2023-09-17 LAB — COMPREHENSIVE METABOLIC PANEL
ALT: 33 U/L (ref 0–44)
AST: 36 U/L (ref 15–41)
Albumin: 2.7 g/dL — ABNORMAL LOW (ref 3.5–5.0)
Alkaline Phosphatase: 113 U/L (ref 38–126)
Anion gap: 12 (ref 5–15)
BUN: 40 mg/dL — ABNORMAL HIGH (ref 8–23)
CO2: 25 mmol/L (ref 22–32)
Calcium: 8.5 mg/dL — ABNORMAL LOW (ref 8.9–10.3)
Chloride: 89 mmol/L — ABNORMAL LOW (ref 98–111)
Creatinine, Ser: 6.56 mg/dL — ABNORMAL HIGH (ref 0.44–1.00)
GFR, Estimated: 6 mL/min — ABNORMAL LOW (ref >60)
Glucose, Bld: 165 mg/dL — ABNORMAL HIGH (ref 70–99)
Potassium: 3.5 mmol/L (ref 3.5–5.1)
Sodium: 126 mmol/L — ABNORMAL LOW (ref 135–145)
Total Bilirubin: 0.6 mg/dL (ref 0.3–1.2)
Total Protein: 7.2 g/dL (ref 6.5–8.1)

## 2023-09-17 LAB — CBC WITH DIFFERENTIAL/PLATELET
Abs Immature Granulocytes: 0.23 10*3/uL — ABNORMAL HIGH (ref 0.00–0.07)
Basophils Absolute: 0.1 10*3/uL (ref 0.0–0.1)
Basophils Relative: 1 %
Eosinophils Absolute: 0 10*3/uL (ref 0.0–0.5)
Eosinophils Relative: 0 %
HCT: 28.2 % — ABNORMAL LOW (ref 36.0–46.0)
Hemoglobin: 9 g/dL — ABNORMAL LOW (ref 12.0–15.0)
Immature Granulocytes: 2 %
Lymphocytes Relative: 10 %
Lymphs Abs: 1.3 10*3/uL (ref 0.7–4.0)
MCH: 32.8 pg (ref 26.0–34.0)
MCHC: 31.9 g/dL (ref 30.0–36.0)
MCV: 102.9 fL — ABNORMAL HIGH (ref 80.0–100.0)
Monocytes Absolute: 0.8 10*3/uL (ref 0.1–1.0)
Monocytes Relative: 7 %
Neutro Abs: 9.7 10*3/uL — ABNORMAL HIGH (ref 1.7–7.7)
Neutrophils Relative %: 80 %
Platelets: 355 10*3/uL (ref 150–400)
RBC: 2.74 MIL/uL — ABNORMAL LOW (ref 3.87–5.11)
RDW: 16.6 % — ABNORMAL HIGH (ref 11.5–15.5)
Smear Review: NORMAL
WBC: 12.2 10*3/uL — ABNORMAL HIGH (ref 4.0–10.5)
nRBC: 0 % (ref 0.0–0.2)

## 2023-09-17 LAB — MAGNESIUM: Magnesium: 2.3 mg/dL (ref 1.7–2.4)

## 2023-09-17 LAB — CBG MONITORING, ED
Glucose-Capillary: 162 mg/dL — ABNORMAL HIGH (ref 70–99)
Glucose-Capillary: 139 mg/dL — ABNORMAL HIGH (ref 70–99)
Glucose-Capillary: 139 mg/dL — ABNORMAL HIGH (ref 70–99)
Glucose-Capillary: 115 mg/dL — ABNORMAL HIGH (ref 70–99)
Glucose-Capillary: 87 mg/dL (ref 70–99)
Glucose-Capillary: 92 mg/dL (ref 70–99)

## 2023-09-17 LAB — HEPATITIS B SURFACE ANTIGEN: Hepatitis B Surface Ag: NONREACTIVE

## 2023-09-17 LAB — LIPASE, BLOOD: Lipase: 23 U/L (ref 11–51)

## 2023-09-17 LAB — VITAMIN B12: Vitamin B-12: 6491 pg/mL — ABNORMAL HIGH (ref 180–914)

## 2023-09-17 LAB — TSH: TSH: 1.818 u[IU]/mL (ref 0.350–4.500)

## 2023-09-17 MED ORDER — HEPARIN SODIUM (PORCINE) 1000 UNIT/ML DIALYSIS: 1000.0000 [IU] | INTRAMUSCULAR | Status: DC | PRN

## 2023-09-17 MED ORDER — SODIUM CHLORIDE 0.9 % IV SOLN
1.0000 g | INTRAVENOUS | Status: DC
  Administered 2023-09-18 – 2023-09-20 (×3): 1 g via INTRAVENOUS
  Filled 2023-09-17 (×3): qty 10

## 2023-09-17 MED ORDER — POTASSIUM CHLORIDE CRYS ER 20 MEQ PO TBCR
40.0000 meq | EXTENDED_RELEASE_TABLET | Freq: Once | ORAL | Status: AC
  Administered 2023-09-17: 40 meq via ORAL
  Filled 2023-09-17: qty 2

## 2023-09-17 MED ORDER — PANTOPRAZOLE SODIUM 20 MG PO TBEC
20.0000 mg | DELAYED_RELEASE_TABLET | Freq: Every day | ORAL | Status: DC
  Administered 2023-09-18 – 2023-09-22 (×5): 20 mg via ORAL
  Filled 2023-09-17 (×6): qty 1

## 2023-09-17 MED ORDER — ALTEPLASE 2 MG IJ SOLR: 2.0000 mg | Freq: Once | INTRAMUSCULAR | Status: DC | PRN

## 2023-09-17 MED ORDER — ONDANSETRON HCL 4 MG PO TABS: 4.0000 mg | ORAL_TABLET | Freq: Three times a day (TID) | ORAL | Status: DC | PRN

## 2023-09-17 MED ORDER — HYDRALAZINE HCL 20 MG/ML IJ SOLN: 5.0000 mg | Freq: Four times a day (QID) | INTRAMUSCULAR | Status: DC | PRN

## 2023-09-17 MED ORDER — ACETAMINOPHEN 650 MG RE SUPP: 650.0000 mg | Freq: Four times a day (QID) | RECTAL | Status: DC | PRN

## 2023-09-17 MED ORDER — METOPROLOL TARTRATE 50 MG PO TABS
100.0000 mg | ORAL_TABLET | Freq: Two times a day (BID) | ORAL | Status: DC
  Administered 2023-09-17 – 2023-09-22 (×8): 100 mg via ORAL
  Filled 2023-09-17 (×9): qty 2

## 2023-09-17 MED ORDER — CHLORHEXIDINE GLUCONATE CLOTH 2 % EX PADS
6.0000 | MEDICATED_PAD | Freq: Every day | CUTANEOUS | Status: DC
  Administered 2023-09-19 – 2023-09-22 (×3): 6 via TOPICAL
  Filled 2023-09-17 (×2): qty 6

## 2023-09-17 MED ORDER — PATIROMER SORBITEX CALCIUM 8.4 G PO PACK: 8.4000 g | PACK | Freq: Every day | ORAL | Status: DC

## 2023-09-17 MED ORDER — LOPERAMIDE HCL 2 MG PO CAPS: 2.0000 mg | ORAL_CAPSULE | Freq: Four times a day (QID) | ORAL | Status: DC | PRN

## 2023-09-17 MED ORDER — SODIUM CHLORIDE 0.9 % IV SOLN
1.0000 g | Freq: Once | INTRAVENOUS | Status: AC
  Administered 2023-09-17: 1 g via INTRAVENOUS
  Filled 2023-09-17: qty 10

## 2023-09-17 MED ORDER — INSULIN ASPART 100 UNIT/ML IJ SOLN: 0.0000 [IU] | Freq: Three times a day (TID) | INTRAMUSCULAR | Status: DC

## 2023-09-17 MED ORDER — POLYETHYLENE GLYCOL 3350 17 G PO PACK: 17.0000 g | PACK | Freq: Every day | ORAL | Status: DC | PRN

## 2023-09-17 MED ORDER — BUPROPION HCL ER (XL) 150 MG PO TB24
150.0000 mg | ORAL_TABLET | Freq: Every day | ORAL | Status: DC
  Administered 2023-09-17 – 2023-09-22 (×5): 150 mg via ORAL
  Filled 2023-09-17 (×5): qty 1

## 2023-09-17 MED ORDER — SODIUM BICARBONATE 650 MG PO TABS
650.0000 mg | ORAL_TABLET | Freq: Two times a day (BID) | ORAL | Status: DC
  Administered 2023-09-17 – 2023-09-22 (×8): 650 mg via ORAL
  Filled 2023-09-17 (×10): qty 1

## 2023-09-17 MED ORDER — LACTULOSE 10 GM/15ML PO SOLN: 30.0000 g | Freq: Two times a day (BID) | ORAL | Status: DC | PRN

## 2023-09-17 MED ORDER — ATORVASTATIN CALCIUM 20 MG PO TABS
20.0000 mg | ORAL_TABLET | Freq: Every day | ORAL | Status: DC
  Administered 2023-09-17 – 2023-09-22 (×5): 20 mg via ORAL
  Filled 2023-09-17 (×5): qty 1

## 2023-09-17 MED ORDER — ACETAMINOPHEN 325 MG PO TABS
650.0000 mg | ORAL_TABLET | Freq: Four times a day (QID) | ORAL | Status: DC | PRN
  Administered 2023-09-18 – 2023-09-20 (×6): 650 mg via ORAL
  Filled 2023-09-17 (×7): qty 2

## 2023-09-17 MED ORDER — ASPIRIN 81 MG PO TBEC
81.0000 mg | DELAYED_RELEASE_TABLET | Freq: Every day | ORAL | Status: DC
  Administered 2023-09-17 – 2023-09-22 (×5): 81 mg via ORAL
  Filled 2023-09-17 (×5): qty 1

## 2023-09-17 MED ORDER — HEPARIN SODIUM (PORCINE) 5000 UNIT/ML IJ SOLN
5000.0000 [IU] | Freq: Two times a day (BID) | INTRAMUSCULAR | Status: DC
  Administered 2023-09-17 – 2023-09-22 (×8): 5000 [IU] via SUBCUTANEOUS
  Filled 2023-09-17 (×8): qty 1

## 2023-09-17 MED ORDER — BUSPIRONE HCL 10 MG PO TABS
5.0000 mg | ORAL_TABLET | Freq: Every day | ORAL | Status: DC
  Administered 2023-09-17 – 2023-09-22 (×5): 5 mg via ORAL
  Filled 2023-09-17 (×5): qty 1

## 2023-09-17 MED ORDER — FESOTERODINE FUMARATE ER 4 MG PO TB24
4.0000 mg | ORAL_TABLET | Freq: Every day | ORAL | Status: DC
  Administered 2023-09-18 – 2023-09-22 (×6): 4 mg via ORAL
  Filled 2023-09-17 (×7): qty 1

## 2023-09-17 MED ORDER — OLANZAPINE 10 MG IM SOLR: 2.5000 mg | Freq: Four times a day (QID) | INTRAMUSCULAR | Status: DC | PRN

## 2023-09-17 MED ORDER — AMLODIPINE BESYLATE 5 MG PO TABS
5.0000 mg | ORAL_TABLET | Freq: Every day | ORAL | Status: DC
  Administered 2023-09-17 – 2023-09-22 (×4): 5 mg via ORAL
  Filled 2023-09-17 (×5): qty 1

## 2023-09-17 MED ORDER — FERROUS SULFATE 325 (65 FE) MG PO TABS
325.0000 mg | ORAL_TABLET | Freq: Every day | ORAL | Status: DC
  Administered 2023-09-18 – 2023-09-22 (×4): 325 mg via ORAL
  Filled 2023-09-17 (×4): qty 1

## 2023-09-17 NOTE — ED Provider Notes (Signed)
[facility] Provider Note    Event Date/Time   First MD Initiated Contact with Patient 09/17/23 [phone]     (approximate)   History   Hypoglycemia   HPI  Heather Lopez is a 77 y.o. female with history of end-stage renal disease on hemodialysis Tuesday, Thursday and Saturday, hypertension, hyperlipidemia, diabetes who presents to the emergency department for hypoglycemia.  Patient was altered per her husband and he called 911.  Blood sugar was found to be 58.  She was given 250 mL of D10 and her blood sugar improved.  She is alert and oriented and has no complaints.  Husband was concerned that her abdomen seemed more distended.  She is no longer doing peritoneal dialysis.  She is not having abdominal pain, fevers, vomiting, diarrhea.  She is scheduled for dialysis at 630 this morning.   History provided by patient, EMS, husband.    Past Medical History:  Diagnosis Date   Acute renal failure (HCC) 06/27/2021   Anxiety    Aortic atherosclerosis (HCC)    Cataract    Depression    Dysrhythmia    GERD (gastroesophageal reflux disease)    Gout    History of kidney stones    Hyperlipidemia    Hypertension    Nausea & vomiting 12/02/2021   Obesity    Obstruction of left ureteropelvic junction (UPJ) due to stone 06/2021   Osteoarthritis    Perforated duodenal ulcer (HCC) 06/27/2021   Pyonephrosis 06/2021   left   Septic shock (HCC) 06/27/2021   Type 2 diabetes mellitus with stage 5 chronic kidney disease (HCC)    Vertigo     Past Surgical History:  Procedure Laterality Date   CAPD REMOVAL N/A 08/02/2023   Procedure: CONTINUOUS AMBULATORY PERITONEAL DIALYSIS  (CAPD) CATHETER REMOVAL;  Surgeon: Leafy Ro, MD;  Location: [facility];  Service: General;  Laterality: N/A;   CATARACT EXTRACTION, BILATERAL Bilateral 2019   COLONOSCOPY WITH PROPOFOL N/A 08/19/2015   Procedure: COLONOSCOPY WITH PROPOFOL;  Surgeon: Midge Minium, MD;  Location: [facility];   Service: Endoscopy;  Laterality: N/A;  diabetic - insulin   CYSTOSCOPY W/ RETROGRADES Bilateral 12/22/2019   Procedure: CYSTOSCOPY WITH RETROGRADE PYELOGRAM;  Surgeon: Riki Altes, MD;  Location: [facility];  Service: Urology;  Laterality: Bilateral;   CYSTOSCOPY W/ URETERAL STENT PLACEMENT Bilateral 07/05/2021   Procedure: CYSTOSCOPY WITH RETROGRADE PYELOGRAM/BILATERAL URETERAL STENT PLACEMENT;  Surgeon: Riki Altes, MD;  Location: [facility];  Service: Urology;  Laterality: Bilateral;   CYSTOSCOPY W/ URETERAL STENT PLACEMENT Bilateral 12/26/2021   Procedure: CYSTOSCOPY WITH STENT REMOVAL;  Surgeon: Riki Altes, MD;  Location: [facility];  Service: Urology;  Laterality: Bilateral;   CYSTOSCOPY W/ URETERAL STENT PLACEMENT Right 03/13/2022   Procedure: CYSTOSCOPY WITH RETROGRADE PYELOGRAM/URETERAL STENT PLACEMENT;  Surgeon: Riki Altes, MD;  Location: [facility];  Service: Urology;  Laterality: Right;   CYSTOSCOPY W/ URETERAL STENT PLACEMENT Right 01/01/2023   Procedure: CYSTOSCOPY WITH STENT EXCHANGE;  Surgeon: Riki Altes, MD;  Location: [facility];  Service: Urology;  Laterality: Right;   CYSTOSCOPY W/ URETERAL STENT REMOVAL Right 05/08/2022   Procedure: CYSTOSCOPY WITH STENT REMOVAL;  Surgeon: Riki Altes, MD;  Location: [facility];  Service: Urology;  Laterality: Right;   CYSTOSCOPY WITH BIOPSY N/A 12/22/2019   Procedure: CYSTOSCOPY WITH bladder BIOPSY;  Surgeon: Riki Altes, MD;  Location: [facility];  Service: Urology;  Laterality: N/A;   CYSTOSCOPY WITH BIOPSY  03/13/2022  Procedure: CYSTOSCOPY WITH BLADDER BIOPSY;  Surgeon: Riki Altes, MD;  Location: [facility];  Service: Urology;;   CYSTOSCOPY WITH STENT PLACEMENT Right 12/22/2019   Procedure: CYSTOSCOPY WITH STENT PLACEMENT;  Surgeon: Riki Altes, MD;  Location: [facility];  Service: Urology;  Laterality: Right;   CYSTOSCOPY WITH STENT PLACEMENT Right 07/03/2022   Procedure: CYSTOSCOPY/RIGHT URETEROSCOPY WITH STENT  PLACEMENT;  Surgeon: Riki Altes, MD;  Location: [facility];  Service: Urology;  Laterality: Right;   CYSTOSCOPY WITH URETEROSCOPY Bilateral 12/22/2019   Procedure: CYSTOSCOPY WITH URETEROSCOPY;  Surgeon: Riki Altes, MD;  Location: [facility];  Service: Urology;  Laterality: Bilateral;   CYSTOSCOPY/URETEROSCOPY/HOLMIUM LASER/STENT PLACEMENT Right 12/22/2019   Procedure: CYSTOSCOPY/URETEROSCOPY/HOLMIUM LASER/STENT PLACEMENT;  Surgeon: Riki Altes, MD;  Location: [facility];  Service: Urology;  Laterality: Right;   CYSTOSCOPY/URETEROSCOPY/HOLMIUM LASER/STENT PLACEMENT Bilateral 12/08/2021   Procedure: CYSTOSCOPY/URETEROSCOPY/HOLMIUM LASER/STENT PLACEMENT;  Surgeon: Sondra Come, MD;  Location: [facility];  Service: Urology;  Laterality: Bilateral;   DIALYSIS/PERMA CATHETER INSERTION N/A 06/29/2021   Procedure: DIALYSIS/PERMA CATHETER INSERTION;  Surgeon: Annice Needy, MD;  Location: [facility];  Service: Cardiovascular;  Laterality: N/A;   DIALYSIS/PERMA CATHETER INSERTION N/A 07/13/2021   Procedure: DIALYSIS/PERMA CATHETER INSERTION;  Surgeon: Annice Needy, MD;  Location: [facility];  Service: Cardiovascular;  Laterality: N/A;   DIALYSIS/PERMA CATHETER INSERTION N/A 08/01/2023   Procedure: DIALYSIS/PERMA CATHETER INSERTION;  Surgeon: Annice Needy, MD;  Location: [facility];  Service: Cardiovascular;  Laterality: N/A;   EYE SURGERY     IR GASTROSTOMY TUBE MOD SED  08/01/2021   LAPAROTOMY N/A 06/29/2021   Procedure: EXPLORATORY LAPAROTOMY WITH REPAIR OF DUODENAL PERFORATION;  Surgeon: Henrene Dodge, MD;  Location: [facility];  Service: General;  Laterality: N/A;   POLYPECTOMY  08/19/2015   Procedure: POLYPECTOMY;  Surgeon: Midge Minium, MD;  Location: [facility];  Service: Endoscopy;;   TUBAL LIGATION  1992    MEDICATIONS:  Prior to Admission medications   Medication Sig Start Date End Date Taking? Authorizing Provider  aspirin 81 MG EC tablet Take 81 mg by  mouth daily.    [provider]  atorvastatin (LIPITOR) 20 MG tablet Take 20 mg by mouth daily.    [provider]  buPROPion (WELLBUTRIN XL) 150 MG 24 hr tablet Take 150 mg by mouth daily.    [provider]  busPIRone (BUSPAR) 5 MG tablet Take 5 mg by mouth daily.    [provider]  cloNIDine (CATAPRES) 0.1 MG tablet Take 1 tablet (0.1 mg total) by mouth 2 (two) times daily. 12/11/21   Enedina Finner, MD  ferrous sulfate 325 (65 FE) MG tablet Take 325 mg by mouth daily with breakfast.    [provider]  insulin aspart (NOVOLOG) 100 UNIT/ML injection Inject 10 Units into the skin 3 (three) times daily before meals.    [provider]  insulin glargine (LANTUS) 100 UNIT/ML injection Inject 60 Units into the skin daily.    [provider]  lactulose (CHRONULAC) 10 GM/15ML solution Take 45 mLs (30 g total) by mouth 2 (two) times daily as needed for mild constipation or moderate constipation. 08/05/23   Arnetha Courser, MD  lansoprazole (PREVACID) 30 MG capsule Take 30 mg by mouth at bedtime.    [provider]  loperamide (IMODIUM A-D) 2 MG tablet Take 2 mg by mouth every 6 (six) hours as needed for diarrhea or loose stools.    [provider]  metoprolol tartrate (LOPRESSOR) 100 MG tablet Take 1 tablet (100 mg total) by mouth 2 (two) times daily. 12/11/21   Enedina Finner, MD  Multiple Vitamin (MULTIVITAMIN WITH MINERALS) TABS tablet Take 1 tablet by mouth daily. 12/12/21   Enedina Finner, MD  ondansetron (ZOFRAN) 4 MG tablet Take 4 mg by mouth every 8 (eight) hours as needed. 06/28/23   [provider]  polyethylene glycol (MIRALAX / GLYCOLAX) 17 g packet Take 17 g by mouth daily as needed for mild constipation or moderate constipation.    [provider]  senna-docusate (SENOKOT-S) 8.6-50 MG tablet Take 1 tablet by mouth at bedtime as needed for mild constipation or moderate constipation.    [provider]   sodium bicarbonate 650 MG tablet Take 650 mg by mouth 2 (two) times daily.    [provider]  trospium (SANCTURA) 20 MG tablet Take 1 tablet (20 mg total) by mouth 2 (two) times daily. 04/18/23 04/12/24  Michiel Cowboy A, PA-C  VELTASSA 8.4 g packet Take 8.4 g by mouth daily. MIX 1 PACKET IN 1/3 CUP OF WATER AND DRINK BY MOUTH DAILY. 06/19/22   [provider]    Physical Exam   Triage Vital Signs: ED Triage Vitals  Encounter Vitals Group     BP 09/17/23 0524 (!) 147/56     Systolic BP Percentile --      Diastolic BP Percentile --      Pulse Rate 09/17/23 0524 73     Resp 09/17/23 0524 18     Temp 09/17/23 0524 97.7 F (36.5 C)     Temp Source 09/17/23 0524 Oral     SpO2 09/17/23 0524 92 %     Weight --      Height --      Head Circumference --      Peak Flow --      Pain Score 09/17/23 0521 0     Pain Loc --      Pain Education --      Exclude from Growth Chart --     Most recent vital signs: Vitals:   09/17/23 0524  BP: (!) 147/56  Pulse: 73  Resp: 18  Temp: 97.7 F (36.5 C)  SpO2: 92%    CONSTITUTIONAL: Alert, responds appropriately to questions.  Chronically ill-appearing, elderly, in no distress, oriented x 4 HEAD: Normocephalic, atraumatic EYES: Conjunctivae clear, pupils appear equal, sclera nonicteric ENT: normal nose; moist mucous membranes NECK: Supple, normal ROM CARD: RRR; S1 and S2 appreciated RESP: Normal chest excursion without splinting or tachypnea; breath sounds clear and equal bilaterally; no wheezes, no rhonchi, no rales, no hypoxia or respiratory distress, speaking full sentences ABD/GI: No fluid wave or tympany, mild distention, soft, non-tender, no rebound, no guarding, no peritoneal signs BACK: The back appears normal EXT: Normal ROM in all joints; no deformity noted, no edema SKIN: Normal color for age and race; warm; no rash on exposed skin NEURO: Moves all extremities equally, normal speech, no facial asymmetry PSYCH:  The patient's mood and manner are appropriate.   ED Results / Procedures / Treatments   LABS: (all labs ordered are listed, but only abnormal results are displayed) Labs Reviewed  COMPREHENSIVE METABOLIC PANEL - Abnormal; Notable for the following components:      Result Value   Sodium 126 (*)    Chloride 89 (*)    Glucose, Bld 165 (*)    BUN 40 (*)    Creatinine, Ser 6.56 (*)  Calcium 8.5 (*)    Albumin 2.7 (*)    GFR, Estimated 6 (*)    All other components within normal limits  CBC WITH DIFFERENTIAL/PLATELET - Abnormal; Notable for the following components:   WBC 12.2 (*)    RBC 2.74 (*)    Hemoglobin 9.0 (*)    HCT 28.2 (*)    MCV 102.9 (*)    RDW 16.6 (*)    Neutro Abs 9.7 (*)    Abs Immature Granulocytes 0.23 (*)    All other components within normal limits  URINALYSIS, ROUTINE W REFLEX MICROSCOPIC - Abnormal; Notable for the following components:   Color, Urine YELLOW (*)    APPearance TURBID (*)    Hgb urine dipstick SMALL (*)    Protein, ur 100 (*)    Leukocytes,Ua LARGE (*)    All other components within normal limits  CBG MONITORING, ED - Abnormal; Notable for the following components:   Glucose-Capillary 162 (*)    All other components within normal limits  CBG MONITORING, ED - Abnormal; Notable for the following components:   Glucose-Capillary 139 (*)    All other components within normal limits  URINE CULTURE  LIPASE, BLOOD  MAGNESIUM     EKG:  RADIOLOGY: My personal review and interpretation of imaging:  CTs pending.  I have personally reviewed all radiology reports.   No results found.   PROCEDURES:  Critical Care performed: YES  CRITICAL CARE Performed by: Rochele Raring   Total critical care time: 30 minutes  Critical care time was exclusive of separately billable procedures and treating other patients.  Critical care was necessary to treat or prevent imminent or life-threatening deterioration.  Critical care was time spent  personally by me on the following activities: development of treatment plan with patient and/or surrogate as well as nursing, discussions with consultants, evaluation of patient's response to treatment, examination of patient, obtaining history from patient or surrogate, ordering and performing treatments and interventions, ordering and review of laboratory studies, ordering and review of radiographic studies, pulse oximetry and re-evaluation of patient's condition.      Procedures    IMPRESSION / MDM / ASSESSMENT AND PLAN / ED COURSE  I reviewed the triage vital signs and the nursing notes.    Patient here with hypoglycemia.  Blood sugar has improved.  She is alert and oriented without complaints.  The patient is on the cardiac monitor to evaluate for evidence of arrhythmia and/or significant heart rate changes.   DIFFERENTIAL DIAGNOSIS (includes but not limited to):   Hypoglycemia, dehydration, UTI, electrolyte derangement, doubt stroke or intracranial hemorrhage   Patient's presentation is most consistent with acute presentation with potential threat to life or bodily function.   PLAN: Will continue to monitor blood sugar.  Will allow her to eat and drink.  Will obtain labs, urine.  Would like to hopefully get her to dialysis this morning if possible.  No focal neurologic deficits on exam.  No indication for head imaging.   MEDICATIONS GIVEN IN ED: Medications  cefTRIAXone (ROCEPHIN) 1 g in sodium chloride 0.9 % 100 mL IVPB (has no administration in time range)     ED COURSE: Patient has been now at bedside stating patient has been more lethargic, sleeping more than normal for the past 2 days.  She did have a fall and hit her head 2 days ago.  She is on aspirin.  Will obtain CT of her head and cervical spine.  Urine does appear infected.  Will give Rocephin.  Culture pending.  Labs show sodium of 126 which could also contribute intermittent confusion.  Repeat blood sugar has  dropped slightly from 160s down to the 130s.  Will continue to closely monitor.  Patient will need admission to the hospital.  Husband comfortable with this plan.  Husband also concerned because he feels her abdomen is more distended than normal.  She is not having any tenderness on exam but will also obtain CT of the abdomen pelvis.  Signed out to oncoming ED physician at 7 AM to follow-up on CT scans and admit to the hospitalist service.   CONSULTS: Patient will need medical admission.   OUTSIDE RECORDS REVIEWED: Reviewed last PCP note on 08/22/2023.       FINAL CLINICAL IMPRESSION(S) / ED DIAGNOSES   Final diagnoses:  Hypoglycemia  Hyponatremia  Altered mental status, unspecified altered mental status type  Acute UTI     Rx / DC Orders   ED Discharge Orders     None        Note:  This document was prepared using Dragon voice recognition software and may include unintentional dictation errors.   Nita Whitmire, Layla Maw, DO 09/17/23 [phone]

## 2023-09-17 NOTE — ED Notes (Addendum)
Pt's brief changed. Dinner tray ordered to new room

## 2023-09-17 NOTE — ED Notes (Signed)
MD at bedside. 

## 2023-09-17 NOTE — ED Notes (Signed)
Brief changed and sacrum pad placed on Stage 1 pressure injury

## 2023-09-17 NOTE — ED Notes (Signed)
Dialysis RN given report

## 2023-09-17 NOTE — Consult Note (Signed)
Full note to follow.  Patient coming to ER with AMS.  Has small parafalcine SDH with fall several days ago.  She has UTI and is on HD for ESRD.  CT reviewed - very small SDH  A/P: No need for re-imaging given history of fall a couple of days ago and another explanation for AMS Cleared from neurosurgical standpoint - no need for surgery Ok to continue ASA 81

## 2023-09-17 NOTE — ED Provider Notes (Signed)
CT head concerning for small subdural. Did discuss with Dr. Myer Haff with neurosurgery, at this time did not feel any intervention required. CT abd/pel without concerning acute abnormality. Will plan on admission. Discussed with Dr. Chipper Herb with the hospitalist service.    Phineas Semen, MD 09/17/23 1124

## 2023-09-17 NOTE — Progress Notes (Signed)
Received patient in bed to unit.    Informed consent signed and in chart.    TX duration: 3.5 hrs     Transported back to floor  Hand-off given to patient's nurse.   Access used:  rt chest cvc Access issues: bfr at 250 d/t elevated arterial pressure  Total UF removed: 1300 mls       Maple Hudson, RN Dialysis Unit

## 2023-09-17 NOTE — H&P (Addendum)
History and Physical    Heather Lopez WJX:[id number] DOB: 1946/05/26 DOA: 09/17/2023  PCP: Carren Rang, PA-C (Confirm with patient/family/NH records and if not entered, this has to be entered at [facility] point of entry) Patient coming from: Home  I have personally briefly reviewed patient's old medical records in [facility]  Chief Complaint: Patient is confused  HPI: Heather Lopez is a 77 y.o. female with medical history significant of ESRD on HD recently switched from PD to HD TTS, HTN, HLD, gout, GERD, IDDM, obesity, brought in by family member for acute confusion.  Husband at bedside gave history.  Patient had first episode of agitation and confusion 2 days ago, patient woke up around 1 AM on Monday, started to have agitation with significant visual and hearing hallucinations, husband thought the patient glucose was low and felt her some orange juice and her symptoms subsided lasted for within 1 hour in the ED husband was able to calm her down.  No events or day yesterday.  This morning around 1 AM again patient woke up with significant screaming and visual hallucination.  Yesterday daytime patient also complained about lower abdominal pain.  But denies any diarrhea or dysuria.  Patient switched to HD last month due to repeated SBP associated with PD.  Last HD was on Saturday.  ED Course: Afebrile, no tachycardia blood pressure 140/50 O2 saturation 95% on room air.  UA showed hematuria with pyuria.  WBC 12.2, sodium 126, potassium 3.5, creatinine 6.5, BUN 40.  CT head positive finding for trace parafalcine subdural hematoma  Review of Systems: Unable to perform, patient is confused  Past Medical History:  Diagnosis Date   Acute renal failure (HCC) 06/27/2021   Anxiety    Aortic atherosclerosis (HCC)    Cataract    Depression    Dysrhythmia    GERD (gastroesophageal reflux disease)    Gout    History of kidney stones    Hyperlipidemia    Hypertension    Nausea & vomiting  12/02/2021   Obesity    Obstruction of left ureteropelvic junction (UPJ) due to stone 06/2021   Osteoarthritis    Perforated duodenal ulcer (HCC) 06/27/2021   Pyonephrosis 06/2021   left   Septic shock (HCC) 06/27/2021   Type 2 diabetes mellitus with stage 5 chronic kidney disease (HCC)    Vertigo     Past Surgical History:  Procedure Laterality Date   CAPD REMOVAL N/A 08/02/2023   Procedure: CONTINUOUS AMBULATORY PERITONEAL DIALYSIS  (CAPD) CATHETER REMOVAL;  Surgeon: Leafy Ro, MD;  Location: [facility];  Service: General;  Laterality: N/A;   CATARACT EXTRACTION, BILATERAL Bilateral 2019   COLONOSCOPY WITH PROPOFOL N/A 08/19/2015   Procedure: COLONOSCOPY WITH PROPOFOL;  Surgeon: Midge Minium, MD;  Location: [facility];  Service: Endoscopy;  Laterality: N/A;  diabetic - insulin   CYSTOSCOPY W/ RETROGRADES Bilateral 12/22/2019   Procedure: CYSTOSCOPY WITH RETROGRADE PYELOGRAM;  Surgeon: Riki Altes, MD;  Location: [facility];  Service: Urology;  Laterality: Bilateral;   CYSTOSCOPY W/ URETERAL STENT PLACEMENT Bilateral 07/05/2021   Procedure: CYSTOSCOPY WITH RETROGRADE PYELOGRAM/BILATERAL URETERAL STENT PLACEMENT;  Surgeon: Riki Altes, MD;  Location: [facility];  Service: Urology;  Laterality: Bilateral;   CYSTOSCOPY W/ URETERAL STENT PLACEMENT Bilateral 12/26/2021   Procedure: CYSTOSCOPY WITH STENT REMOVAL;  Surgeon: Riki Altes, MD;  Location: [facility];  Service: Urology;  Laterality: Bilateral;   CYSTOSCOPY W/ URETERAL STENT PLACEMENT Right 03/13/2022   Procedure: CYSTOSCOPY WITH  RETROGRADE PYELOGRAM/URETERAL STENT PLACEMENT;  Surgeon: Riki Altes, MD;  Location: [facility];  Service: Urology;  Laterality: Right;   CYSTOSCOPY W/ URETERAL STENT PLACEMENT Right 01/01/2023   Procedure: CYSTOSCOPY WITH STENT EXCHANGE;  Surgeon: Riki Altes, MD;  Location: [facility];  Service: Urology;  Laterality: Right;   CYSTOSCOPY W/ URETERAL STENT REMOVAL Right 05/08/2022    Procedure: CYSTOSCOPY WITH STENT REMOVAL;  Surgeon: Riki Altes, MD;  Location: [facility];  Service: Urology;  Laterality: Right;   CYSTOSCOPY WITH BIOPSY N/A 12/22/2019   Procedure: CYSTOSCOPY WITH bladder BIOPSY;  Surgeon: Riki Altes, MD;  Location: [facility];  Service: Urology;  Laterality: N/A;   CYSTOSCOPY WITH BIOPSY  03/13/2022   Procedure: CYSTOSCOPY WITH BLADDER BIOPSY;  Surgeon: Riki Altes, MD;  Location: [facility];  Service: Urology;;   CYSTOSCOPY WITH STENT PLACEMENT Right 12/22/2019   Procedure: CYSTOSCOPY WITH STENT PLACEMENT;  Surgeon: Riki Altes, MD;  Location: [facility];  Service: Urology;  Laterality: Right;   CYSTOSCOPY WITH STENT PLACEMENT Right 07/03/2022   Procedure: CYSTOSCOPY/RIGHT URETEROSCOPY WITH STENT PLACEMENT;  Surgeon: Riki Altes, MD;  Location: [facility];  Service: Urology;  Laterality: Right;   CYSTOSCOPY WITH URETEROSCOPY Bilateral 12/22/2019   Procedure: CYSTOSCOPY WITH URETEROSCOPY;  Surgeon: Riki Altes, MD;  Location: [facility];  Service: Urology;  Laterality: Bilateral;   CYSTOSCOPY/URETEROSCOPY/HOLMIUM LASER/STENT PLACEMENT Right 12/22/2019   Procedure: CYSTOSCOPY/URETEROSCOPY/HOLMIUM LASER/STENT PLACEMENT;  Surgeon: Riki Altes, MD;  Location: [facility];  Service: Urology;  Laterality: Right;   CYSTOSCOPY/URETEROSCOPY/HOLMIUM LASER/STENT PLACEMENT Bilateral 12/08/2021   Procedure: CYSTOSCOPY/URETEROSCOPY/HOLMIUM LASER/STENT PLACEMENT;  Surgeon: Sondra Come, MD;  Location: [facility];  Service: Urology;  Laterality: Bilateral;   DIALYSIS/PERMA CATHETER INSERTION N/A 06/29/2021   Procedure: DIALYSIS/PERMA CATHETER INSERTION;  Surgeon: Annice Needy, MD;  Location: [facility];  Service: Cardiovascular;  Laterality: N/A;   DIALYSIS/PERMA CATHETER INSERTION N/A 07/13/2021   Procedure: DIALYSIS/PERMA CATHETER INSERTION;  Surgeon: Annice Needy, MD;  Location: [facility];  Service: Cardiovascular;  Laterality: N/A;    DIALYSIS/PERMA CATHETER INSERTION N/A 08/01/2023   Procedure: DIALYSIS/PERMA CATHETER INSERTION;  Surgeon: Annice Needy, MD;  Location: [facility];  Service: Cardiovascular;  Laterality: N/A;   EYE SURGERY     IR GASTROSTOMY TUBE MOD SED  08/01/2021   LAPAROTOMY N/A 06/29/2021   Procedure: EXPLORATORY LAPAROTOMY WITH REPAIR OF DUODENAL PERFORATION;  Surgeon: Henrene Dodge, MD;  Location: [facility];  Service: General;  Laterality: N/A;   POLYPECTOMY  08/19/2015   Procedure: POLYPECTOMY;  Surgeon: Midge Minium, MD;  Location: [facility];  Service: Endoscopy;;   TUBAL LIGATION  1992     reports that she quit smoking about 51 years ago. Her smoking use included cigarettes. She started smoking about 57 years ago. She has a 4.5 pack-year smoking history. She has never used smokeless tobacco. She reports that she does not drink alcohol and does not use drugs.  Allergies  Allergen Reactions   Contrast Media  [Iodinated Contrast Media] Anaphylaxis   Iodine Swelling    (IV only) - angioedema    Family History  Problem Relation Age of Onset   Diabetes Father    Cancer Father        lung cancer   Diabetes Brother    Stroke Mother      Prior to Admission medications   Medication Sig Start Date End Date Taking? Authorizing Provider  aspirin 81 MG EC tablet Take  81 mg by mouth daily.    [provider]  atorvastatin (LIPITOR) 20 MG tablet Take 20 mg by mouth daily.    [provider]  buPROPion (WELLBUTRIN XL) 150 MG 24 hr tablet Take 150 mg by mouth daily.    [provider]  busPIRone (BUSPAR) 5 MG tablet Take 5 mg by mouth daily.    [provider]  cloNIDine (CATAPRES) 0.1 MG tablet Take 1 tablet (0.1 mg total) by mouth 2 (two) times daily. 12/11/21   Enedina Finner, MD  ferrous sulfate 325 (65 FE) MG tablet Take 325 mg by mouth daily with breakfast.    [provider]  insulin aspart (NOVOLOG) 100 UNIT/ML injection Inject 10 Units into  the skin 3 (three) times daily before meals.    [provider]  insulin glargine (LANTUS) 100 UNIT/ML injection Inject 60 Units into the skin daily.    [provider]  lactulose (CHRONULAC) 10 GM/15ML solution Take 45 mLs (30 g total) by mouth 2 (two) times daily as needed for mild constipation or moderate constipation. 08/05/23   Arnetha Courser, MD  lansoprazole (PREVACID) 30 MG capsule Take 30 mg by mouth at bedtime.    [provider]  loperamide (IMODIUM A-D) 2 MG tablet Take 2 mg by mouth every 6 (six) hours as needed for diarrhea or loose stools.    [provider]  metoprolol tartrate (LOPRESSOR) 100 MG tablet Take 1 tablet (100 mg total) by mouth 2 (two) times daily. 12/11/21   Enedina Finner, MD  Multiple Vitamin (MULTIVITAMIN WITH MINERALS) TABS tablet Take 1 tablet by mouth daily. 12/12/21   Enedina Finner, MD  ondansetron (ZOFRAN) 4 MG tablet Take 4 mg by mouth every 8 (eight) hours as needed. 06/28/23   [provider]  polyethylene glycol (MIRALAX / GLYCOLAX) 17 g packet Take 17 g by mouth daily as needed for mild constipation or moderate constipation.    [provider]  senna-docusate (SENOKOT-S) 8.6-50 MG tablet Take 1 tablet by mouth at bedtime as needed for mild constipation or moderate constipation.    [provider]  sodium bicarbonate 650 MG tablet Take 650 mg by mouth 2 (two) times daily.    [provider]  trospium (SANCTURA) 20 MG tablet Take 1 tablet (20 mg total) by mouth 2 (two) times daily. 04/18/23 04/12/24  Michiel Cowboy A, PA-C  VELTASSA 8.4 g packet Take 8.4 g by mouth daily. MIX 1 PACKET IN 1/3 CUP OF WATER AND DRINK BY MOUTH DAILY. 06/19/22   [provider]    Physical Exam: Vitals:   09/17/23 0524 09/17/23 0747  BP: (!) 147/56 (!) 149/74  Pulse: 73 69  Resp: 18 18  Temp: 97.7 F (36.5 C)   TempSrc: Oral   SpO2: 92% 95%    Constitutional: NAD, calm, comfortable Vitals:   09/17/23  0524 09/17/23 0747  BP: (!) 147/56 (!) 149/74  Pulse: 73 69  Resp: 18 18  Temp: 97.7 F (36.5 C)   TempSrc: Oral   SpO2: 92% 95%   Eyes: PERRL, lids and conjunctivae normal ENMT: Mucous membranes are moist. Posterior pharynx clear of any exudate or lesions.Normal dentition.  Neck: normal, supple, no masses, no thyromegaly Respiratory: clear to auscultation bilaterally, no wheezing, no crackles. Normal respiratory effort. No accessory muscle use.  Cardiovascular: Regular rate and rhythm, no murmurs / rubs / gallops. No extremity edema. 2+ pedal pulses. No carotid bruits.  Abdomen: Distended, mild tenderness on the suprapubic  area, no rebound no guarding, no masses palpated. No hepatosplenomegaly. Bowel sounds positive.  Musculoskeletal: no clubbing / cyanosis. No joint deformity upper and lower extremities. Good ROM, no contractures. Normal muscle tone.  Skin: no rashes, lesions, ulcers. No induration Neurologic: No facial droops, following commands. Psychiatric: Awake, confused    Labs on Admission: I have personally reviewed following labs and imaging studies  CBC: Recent Labs  Lab 09/17/23 0530  WBC 12.2*  NEUTROABS 9.7*  HGB 9.0*  HCT 28.2*  MCV 102.9*  PLT 355   Basic Metabolic Panel: Recent Labs  Lab 09/17/23 0530  NA 126*  K 3.5  CL 89*  CO2 25  GLUCOSE 165*  BUN 40*  CREATININE 6.56*  CALCIUM 8.5*  MG 2.3   GFR: CrCl cannot be calculated (Unknown ideal weight.). Liver Function Tests: Recent Labs  Lab 09/17/23 0530  AST 36  ALT 33  ALKPHOS 113  BILITOT 0.6  PROT 7.2  ALBUMIN 2.7*   Recent Labs  Lab 09/17/23 0530  LIPASE 23   No results for input(s): "AMMONIA" in the last 168 hours. Coagulation Profile: No results for input(s): "INR", "PROTIME" in the last 168 hours. Cardiac Enzymes: No results for input(s): "CKTOTAL", "CKMB", "CKMBINDEX", "TROPONINI" in the last 168 hours. BNP (last 3 results) No results for input(s): "PROBNP" in the last  8760 hours. HbA1C: No results for input(s): "HGBA1C" in the last 72 hours. CBG: Recent Labs  Lab 09/17/23 0527 09/17/23 0649 09/17/23 0801  GLUCAP 162* 139* 139*   Lipid Profile: No results for input(s): "CHOL", "HDL", "LDLCALC", "TRIG", "CHOLHDL", "LDLDIRECT" in the last 72 hours. Thyroid Function Tests: No results for input(s): "TSH", "T4TOTAL", "FREET4", "T3FREE", "THYROIDAB" in the last 72 hours. Anemia Panel: No results for input(s): "VITAMINB12", "FOLATE", "FERRITIN", "TIBC", "IRON", "RETICCTPCT" in the last 72 hours. Urine analysis:    Component Value Date/Time   COLORURINE YELLOW (A) 09/17/2023 0550   APPEARANCEUR TURBID (A) 09/17/2023 0550   APPEARANCEUR Cloudy (A) 12/20/2022 0954   LABSPEC 1.006 09/17/2023 0550   LABSPEC 1.024 01/27/2014 1350   PHURINE 8.0 09/17/2023 0550   GLUCOSEU NEGATIVE 09/17/2023 0550   GLUCOSEU >=500 01/27/2014 1350   HGBUR SMALL (A) 09/17/2023 0550   BILIRUBINUR NEGATIVE 09/17/2023 0550   BILIRUBINUR Negative 12/20/2022 0954   BILIRUBINUR Negative 01/27/2014 1350   KETONESUR NEGATIVE 09/17/2023 0550   PROTEINUR 100 (A) 09/17/2023 0550   UROBILINOGEN 0.2 02/10/2022 0907   NITRITE NEGATIVE 09/17/2023 0550   LEUKOCYTESUR LARGE (A) 09/17/2023 0550   LEUKOCYTESUR 2+ 01/27/2014 1350    Radiological Exams on Admission: [place] ASCITES (ABDOMEN LIMITED)  Result Date: 09/17/2023 CLINICAL DATA:  Ascites search EXAM: LIMITED ABDOMEN ULTRASOUND FOR ASCITES TECHNIQUE: Limited ultrasound survey for ascites was performed in all four abdominal quadrants. COMPARISON:  None Available. FINDINGS: No abdominal ascites. Partially visualized abdominal structures are unremarkable. IMPRESSION: No evidence of abdominal ascites. Electronically Signed   By: Allegra Lai M.D.   On: 09/17/2023 11:48   CT ABDOMEN PELVIS WO CONTRAST  Result Date: 09/17/2023 CLINICAL DATA:  Altered mental status.  Pain EXAM: CT ABDOMEN AND PELVIS WITHOUT CONTRAST TECHNIQUE:  Multidetector CT imaging of the abdomen and pelvis was performed following the standard protocol without IV contrast. RADIATION DOSE REDUCTION: This exam was performed according to the departmental dose-optimization program which includes automated exposure control, adjustment of the mA and/or kV according to patient size and/or use of iterative reconstruction technique. COMPARISON:  CT 07/30/2023 and older FINDINGS: Lower chest: There is some linear opacity  lung bases likely scar or atelectasis. No pleural effusion. Trace pericardial effusion. Hepatobiliary: On this non IV contrast exam, the liver parenchyma is grossly preserved. Gallbladder is nondilated. Pancreas: Unremarkable. No pancreatic ductal dilatation or surrounding inflammatory changes. Spleen: Lobular spleen.  Stable. Adrenals/Urinary Tract: Adrenal glands are preserved. Both kidneys show moderate renal collecting system dilatation, similar to previous. There is air within the left renal collecting system. Moderate bilateral perinephric stranding. There is also a right-sided ureteral stent with pigtail in the anterior right mid kidney collecting system and distal in the bladder. Stable caliber of the right ureter. Mild wall thickening of the urinary bladder with slight stranding. Please correlate with clinical history. Stomach/Bowel: On this non oral contrast exam, the large bowel has a normal course and caliber. Moderate right-sided colonic stool. Slightly redundant course of the transverse colon. Normal appendix in the right hemipelvis posterior to the cecum. Few left-sided colonic diverticula. The stomach and small bowel are nondilated. Vascular/Lymphatic: Normal caliber aorta and IVC with scattered vascular calcifications. There are some small nodes identified along retroperitoneum. These are unchanged from previous. No new lymph node enlargement. Reproductive: Multiple calcified uterine fibroids. No separate adnexal mass. Other: No free intra-air  identified. Trace areas of fluid but decreased from the prior CT scan. Musculoskeletal: Scattered degenerative changes of the spine and pelvis, moderate. Multilevel canal stenosis along the lumbar spine. Degenerative changes about the hips are particularly advanced. IMPRESSION: Stable moderate bilateral renal collecting system dilatation. Indwelling right-sided ureteral stent. There is some air in the bladder and left renal collecting system. Please correlate with any recent instrumentation. Bladder wall is also slightly thickened with some adjacent stranding. Previous peritoneal dialysis catheter no longer identified. Decreasing ascites. Nonspecific bowel gas pattern with moderate colonic stool. Normal appendix. Electronically Signed   By: Karen Kays M.D.   On: 09/17/2023 10:24   CT HEAD WO CONTRAST ( )  Addendum Date: 09/17/2023   ADDENDUM REPORT: 09/17/2023 08:35 ADDENDUM: Study discussed by telephone with Dr. Gwendolyn Grant on 09/17/2023 at 0805 hours. Electronically Signed   By: Odessa Fleming M.D.   On: 09/17/2023 08:35   Result Date: 09/17/2023 CLINICAL DATA:  77 year old female with hypoglycemia, altered mental status. EXAM: CT HEAD WITHOUT CONTRAST TECHNIQUE: Contiguous axial images were obtained from the base of the skull through the vertex without intravenous contrast. RADIATION DOSE REDUCTION: This exam was performed according to the departmental dose-optimization program which includes automated exposure control, adjustment of the mA and/or kV according to patient size and/or use of iterative reconstruction technique. COMPARISON:  Brain MRI andHead CT 01/27/2014. FINDINGS: Brain: Small or trace para falcine subdural blood, 3 mm in thickness (series 3, image 26). No intraventricular hemorrhage. No other acute intracranial hemorrhage identified. No midline shift or intracranial mass effect. No ventriculomegaly. Some generalized cerebral volume loss since 2015. No cortically based acute infarct  identified. Mild for age patchy white matter hypodensity. Vascular: No suspicious intracranial vascular hyperdensity. Calcified atherosclerosis at the skull base. Skull: No fracture identified. Sinuses/Orbits: Visualized paranasal sinuses and mastoids are stable and well aerated. Other: Postoperative changes to both globes since 2015. Some calcified scalp vessel atherosclerosis. Right posterior convexity broad-based scalp soft tissue contusion and/or mild hematoma (series 2, image 72). Underlying calvarium appears intact. IMPRESSION: 1. Positive for trace parafalcine Subdural hematoma, 3 mm in thickness. No intracranial mass effect. 2. No other acute intracranial hemorrhage or acute intracranial abnormality identified. 3. Evidence of right posterior scalp soft tissue injury. No skull fracture identified. Electronically Signed: By: Rexene Edison  Margo Aye M.D. On: 09/17/2023 07:59   CT Cervical Spine Wo Contrast  Result Date: 09/17/2023 CLINICAL DATA:  77 year old female with hypoglycemia, altered mental status. EXAM: CT CERVICAL SPINE WITHOUT CONTRAST TECHNIQUE: Multidetector CT imaging of the cervical spine was performed without intravenous contrast. Multiplanar CT image reconstructions were also generated. RADIATION DOSE REDUCTION: This exam was performed according to the departmental dose-optimization program which includes automated exposure control, adjustment of the mA and/or kV according to patient size and/or use of iterative reconstruction technique. COMPARISON:  Head CT today. FINDINGS: Alignment: Straightening and mild reversal of cervical lordosis. Multilevel mild degenerative appearing upper cervical spondylolisthesis. Cervicothoracic junction alignment is within normal limits. Bilateral posterior element alignment is within normal limits. Skull base and vertebrae: Visualized skull base is intact. No atlanto-occipital dissociation. C1 and C2 appear chronically degenerated but intact and aligned. No acute osseous  abnormality identified. Soft tissues and spinal canal: No prevertebral fluid or swelling. No visible canal hematoma. Partially retropharyngeal course of both carotids with calcified atherosclerosis. Right IJ approach dual lumen dialysis type catheter. Disc levels: Widespread advanced cervical spine degeneration, including upper cervical facet arthropathy superimposed on mild multilevel spondylolisthesis. Degenerative appearing ankylosis at both C6-C7, and at the left C7-T1 facets. Probable mild multilevel degenerative cervical spinal stenosis. Upper chest: Mild respiratory motion, otherwise negative. IMPRESSION: 1. No acute traumatic injury identified in the cervical spine. 2. Widespread advanced cervical spine degeneration. Electronically Signed   By: Odessa Fleming M.D.   On: 09/17/2023 08:03    EKG: Ordered  Assessment/Plan Principal Problem:   Encephalopathy Active Problems:   ESRD on peritoneal dialysis [facility])   UTI (urinary tract infection)   AMS (altered mental status)  (please populate well all problems here in Problem List. (For example, if patient is on BP meds at home and you resume or decide to hold them, it is a problem that needs to be her. Same for CAD, COPD, HLD and so on)  Acute metabolic encephalopathy -Secondary to UTI, fall and concussion cannot be ruled out as patient does have a small subdural hematoma. -Continue ceftriaxone -Neurochecks x 1 day -As needed Zyprexa for agitation  Subdural hematoma -Small parafalcine subdural hematoma less than 3 mm was seen on today's CT.  Husband unsure about patient had fall at home.  Neuroexam nonfocal.  Neurosurgery reviewed the films and recommend no indication to repeat CT head.  UTI with hematuria -Continue ceftriaxone -Other Ddx, abdomen distended, husband reported that distention is new compared to her last week.  Will check ascites study and possibly paracentesis to rule out SBP.  Hyponatremia -Chronic, baseline  127-131  Hypokalemia -PO replacement, repeat level in AM>  IDDM, with hyperglycemia -SSI for now -A1C=7.2, less concern about hypoglycemia  HTN -Continue metoprolol -Given there is some worsening of mentation, will hold off clonidine.  Start amlodipine and hydralazine.  ESRD on HD TTS -Nephrology consulted for routine dialysis today  Chronic sacral pressure ulcer -Appears to be stable, unable to examine due to altered mentations, husband does wound care at home.  Consult wound care team.  DVT prophylaxis: Heparin subcu Code Status: DNR Family Communication: Husband at bedside Disposition Plan: Expect less than 2 midnight hospital stay Consults called: Nephrology Admission status: Telemetry observation   Emeline General MD [facility] Pager [phone]  09/17/2023, 11:51 AM

## 2023-09-17 NOTE — Consult Note (Signed)
WOC Nurse Consult Note: Reason for Consult: chronic sacral wound Wound type: Stage 1 Pressure Injury; discussed wound/skin with bedside nursing  Pressure Injury POA: Yes Measurement: see nursing flow sheets when 2 nurse skin assessment complete Wound bed: intact, non blanchable redness  Drainage (amount, consistency, odor) none Periwound: intact  Dressing procedure/placement/frequency: Nursing skin care order set for Stage 1 Pressure Injury: implemented silicone foam appropriately. Orders updated    Re consult if needed, will not follow at this time. Thanks  Arlester Keehan [employer], RN,CWOCN, CNS, CWON-AP [phone])

## 2023-09-17 NOTE — Progress Notes (Signed)
[facility]  ROUNDING NOTE   Subjective:   [name] was admitted to [facility] on 09/17/2023 for Encephalopathy [G93.40].  She is known to our practice and receives outpatient dialysis treatments at [facility] on a TTS schedule.  Last treatment completed on Saturday.  Patient was reported to have altered mental status and was found to have initial blood sugar of 58.  Patient's husband reports hypoglycemic events during the past week as well.  Reports no missed dialysis treatments.  Labs on ED arrival significant for sodium 126, WBCs 12.2, and hemoglobin 9.0.  UA appears turbid with leukocytes.  Urine culture pending.  We have been consulted to manage dialysis needs.   Objective:  Vital signs in last 24 hours:  Temp:  [97.7 F (36.5 C)] 97.7 F (36.5 C) (10/22 0524) Pulse Rate:  [69-73] 69 (10/22 0747) Resp:  [18] 18 (10/22 0747) BP: (147-149)/(56-74) 149/74 (10/22 0747) SpO2:  [92 %-95 %] 95 % (10/22 0747)  Weight change:  There were no vitals filed for this visit.   Intake/Output: No intake/output data recorded.   Intake/Output this shift:  No intake/output data recorded.  Physical Exam: General: NAD  Head: Normocephalic, atraumatic. Moist oral mucosal membranes  Eyes: Anicteric  Neck: Supple  Lungs:  Clear to auscultation  Heart: Regular rate and rhythm  Abdomen:  Soft, nontender  Extremities:  no peripheral edema.   Neurologic: Alert, moving all four extremities  Skin: No lesions  Access: Rt chest permcath    Basic Metabolic Panel: Recent Labs  Lab 09/17/23 0530  NA 126*  K 3.5  CL 89*  CO2 25  GLUCOSE 165*  BUN 40*  CREATININE 6.56*  CALCIUM 8.5*  MG 2.3    Liver Function Tests: Recent Labs  Lab 09/17/23 0530  AST 36  ALT 33  ALKPHOS 113  BILITOT 0.6  PROT 7.2  ALBUMIN 2.7*   Recent Labs  Lab 09/17/23 0530  LIPASE 23   No results for input(s): "AMMONIA" in the last 168 hours.  CBC: Recent Labs  Lab  09/17/23 0530  WBC 12.2*  NEUTROABS 9.7*  HGB 9.0*  HCT 28.2*  MCV 102.9*  PLT 355    Cardiac Enzymes: No results for input(s): "CKTOTAL", "CKMB", "CKMBINDEX", "TROPONINI" in the last 168 hours.  BNP: Invalid input(s): "POCBNP"  CBG: Recent Labs  Lab 09/17/23 0527 09/17/23 0649 09/17/23 0801 09/17/23 1211  GLUCAP 162* 139* 139* 115*    Microbiology: Results for orders placed or performed during the hospital encounter of 07/30/23  Culture, blood (routine x 2)     Status: None   Collection Time: 07/30/23  5:31 PM   Specimen: BLOOD  Result Value Ref Range Status   Specimen Description BLOOD RIGHT ANTECUBITAL  Final   Special Requests   Final    BOTTLES DRAWN AEROBIC AND ANAEROBIC Blood Culture adequate volume   Culture   Final    NO GROWTH 5 DAYS Performed at [facility], [address] [place], [place] [address]    Report Status 08/04/2023 FINAL  Final  Culture, blood (routine x 2)     Status: None   Collection Time: 07/30/23  7:00 PM   Specimen: BLOOD  Result Value Ref Range Status   Specimen Description BLOOD BLOOD LEFT FOREARM  Final   Special Requests   Final    BOTTLES DRAWN AEROBIC AND ANAEROBIC Blood Culture results may not be optimal due to an inadequate volume of blood received in culture bottles  Culture   Final    NO GROWTH 5 DAYS Performed at [facility], [address] [place], [place] [address]    Report Status 08/04/2023 FINAL  Final  Urine Culture     Status: Abnormal   Collection Time: 07/30/23  7:43 PM   Specimen: Urine, Random  Result Value Ref Range Status   Specimen Description   Final    URINE, RANDOM Performed at [facility], [address] [place], [place] [address]    Special Requests   Final    NONE Reflexed from [id number] Performed at [facility], [address] [place], [place] [address]    Culture (A)  Final    <10,000 COLONIES/mL INSIGNIFICANT GROWTH Performed at [facility], [address] [place], [place] [address]    Report Status 08/01/2023 FINAL  Final    Coagulation Studies: No results for input(s): "LABPROT", "INR" in the last 72 hours.  Urinalysis: Recent Labs    09/17/23 0550  COLORURINE YELLOW*  LABSPEC 1.006  PHURINE 8.0  GLUCOSEU NEGATIVE  HGBUR SMALL*  BILIRUBINUR NEGATIVE  KETONESUR NEGATIVE  PROTEINUR 100*  NITRITE NEGATIVE  LEUKOCYTESUR LARGE*      Imaging: [place] ASCITES (ABDOMEN LIMITED)  Result Date: 09/17/2023 CLINICAL DATA:  Ascites search EXAM: LIMITED ABDOMEN ULTRASOUND FOR ASCITES TECHNIQUE: Limited ultrasound survey for ascites was performed in all four abdominal quadrants. COMPARISON:  None Available. FINDINGS: No abdominal ascites. Partially visualized abdominal structures are unremarkable. IMPRESSION: No evidence of abdominal ascites. Electronically Signed   By: Allegra Lai M.D.   On: 09/17/2023 11:48   CT ABDOMEN PELVIS WO CONTRAST  Result Date: 09/17/2023 CLINICAL DATA:  Altered mental status.  Pain EXAM: CT ABDOMEN AND PELVIS WITHOUT CONTRAST TECHNIQUE: Multidetector CT imaging of the abdomen and pelvis was performed following the standard protocol without IV contrast. RADIATION DOSE REDUCTION: This exam was performed according to the departmental dose-optimization program which includes automated exposure control, adjustment of the mA and/or kV according to patient size and/or use of iterative reconstruction technique. COMPARISON:  CT 07/30/2023 and older FINDINGS: Lower chest: There is some linear opacity lung bases likely scar or atelectasis. No pleural effusion. Trace pericardial effusion. Hepatobiliary: On this non IV contrast exam, the liver parenchyma is grossly preserved. Gallbladder is nondilated. Pancreas: Unremarkable. No pancreatic ductal dilatation or surrounding inflammatory changes. Spleen: Lobular spleen.  Stable. Adrenals/Urinary Tract: Adrenal glands are preserved. Both kidneys show moderate  renal collecting system dilatation, similar to previous. There is air within the left renal collecting system. Moderate bilateral perinephric stranding. There is also a right-sided ureteral stent with pigtail in the anterior right mid kidney collecting system and distal in the bladder. Stable caliber of the right ureter. Mild wall thickening of the urinary bladder with slight stranding. Please correlate with clinical history. Stomach/Bowel: On this non oral contrast exam, the large bowel has a normal course and caliber. Moderate right-sided colonic stool. Slightly redundant course of the transverse colon. Normal appendix in the right hemipelvis posterior to the cecum. Few left-sided colonic diverticula. The stomach and small bowel are nondilated. Vascular/Lymphatic: Normal caliber aorta and IVC with scattered vascular calcifications. There are some small nodes identified along retroperitoneum. These are unchanged from previous. No new lymph node enlargement. Reproductive: Multiple calcified uterine fibroids. No separate adnexal mass. Other: No free intra-air identified. Trace areas of fluid but decreased from the prior CT scan. Musculoskeletal: Scattered degenerative changes of the spine and pelvis, moderate. Multilevel canal stenosis along the  lumbar spine. Degenerative changes about the hips are particularly advanced. IMPRESSION: Stable moderate bilateral renal collecting system dilatation. Indwelling right-sided ureteral stent. There is some air in the bladder and left renal collecting system. Please correlate with any recent instrumentation. Bladder wall is also slightly thickened with some adjacent stranding. Previous peritoneal dialysis catheter no longer identified. Decreasing ascites. Nonspecific bowel gas pattern with moderate colonic stool. Normal appendix. Electronically Signed   By: Karen Kays M.D.   On: 09/17/2023 10:24   CT HEAD WO CONTRAST ( )  Addendum Date: 09/17/2023   ADDENDUM REPORT:  09/17/2023 08:35 ADDENDUM: Study discussed by telephone with Dr. Gwendolyn Grant on 09/17/2023 at 0805 hours. Electronically Signed   By: Odessa Fleming M.D.   On: 09/17/2023 08:35   Result Date: 09/17/2023 CLINICAL DATA:  77 year old female with hypoglycemia, altered mental status. EXAM: CT HEAD WITHOUT CONTRAST TECHNIQUE: Contiguous axial images were obtained from the base of the skull through the vertex without intravenous contrast. RADIATION DOSE REDUCTION: This exam was performed according to the departmental dose-optimization program which includes automated exposure control, adjustment of the mA and/or kV according to patient size and/or use of iterative reconstruction technique. COMPARISON:  Brain MRI andHead CT 01/27/2014. FINDINGS: Brain: Small or trace para falcine subdural blood, 3 mm in thickness (series 3, image 26). No intraventricular hemorrhage. No other acute intracranial hemorrhage identified. No midline shift or intracranial mass effect. No ventriculomegaly. Some generalized cerebral volume loss since 2015. No cortically based acute infarct identified. Mild for age patchy white matter hypodensity. Vascular: No suspicious intracranial vascular hyperdensity. Calcified atherosclerosis at the skull base. Skull: No fracture identified. Sinuses/Orbits: Visualized paranasal sinuses and mastoids are stable and well aerated. Other: Postoperative changes to both globes since 2015. Some calcified scalp vessel atherosclerosis. Right posterior convexity broad-based scalp soft tissue contusion and/or mild hematoma (series 2, image 72). Underlying calvarium appears intact. IMPRESSION: 1. Positive for trace parafalcine Subdural hematoma, 3 mm in thickness. No intracranial mass effect. 2. No other acute intracranial hemorrhage or acute intracranial abnormality identified. 3. Evidence of right posterior scalp soft tissue injury. No skull fracture identified. Electronically Signed: By: Odessa Fleming M.D. On: 09/17/2023 07:59    CT Cervical Spine Wo Contrast  Result Date: 09/17/2023 CLINICAL DATA:  77 year old female with hypoglycemia, altered mental status. EXAM: CT CERVICAL SPINE WITHOUT CONTRAST TECHNIQUE: Multidetector CT imaging of the cervical spine was performed without intravenous contrast. Multiplanar CT image reconstructions were also generated. RADIATION DOSE REDUCTION: This exam was performed according to the departmental dose-optimization program which includes automated exposure control, adjustment of the mA and/or kV according to patient size and/or use of iterative reconstruction technique. COMPARISON:  Head CT today. FINDINGS: Alignment: Straightening and mild reversal of cervical lordosis. Multilevel mild degenerative appearing upper cervical spondylolisthesis. Cervicothoracic junction alignment is within normal limits. Bilateral posterior element alignment is within normal limits. Skull base and vertebrae: Visualized skull base is intact. No atlanto-occipital dissociation. C1 and C2 appear chronically degenerated but intact and aligned. No acute osseous abnormality identified. Soft tissues and spinal canal: No prevertebral fluid or swelling. No visible canal hematoma. Partially retropharyngeal course of both carotids with calcified atherosclerosis. Right IJ approach dual lumen dialysis type catheter. Disc levels: Widespread advanced cervical spine degeneration, including upper cervical facet arthropathy superimposed on mild multilevel spondylolisthesis. Degenerative appearing ankylosis at both C6-C7, and at the left C7-T1 facets. Probable mild multilevel degenerative cervical spinal stenosis. Upper chest: Mild respiratory motion, otherwise negative. IMPRESSION: 1. No acute traumatic injury identified in the  cervical spine. 2. Widespread advanced cervical spine degeneration. Electronically Signed   By: Odessa Fleming M.D.   On: 09/17/2023 08:03     Medications:    [START ON 09/18/2023] cefTRIAXone (ROCEPHIN)  IV       amLODipine  5 mg Oral Daily   aspirin EC  81 mg Oral Daily   atorvastatin  20 mg Oral Daily   buPROPion  150 mg Oral Daily   busPIRone  5 mg Oral Daily   Chlorhexidine Gluconate Cloth  6 each Topical Q0600   [START ON 09/18/2023] ferrous sulfate  325 mg Oral Q breakfast   fesoterodine  4 mg Oral Daily   heparin  5,000 Units Subcutaneous Q12H   insulin aspart  0-9 Units Subcutaneous TID WC   metoprolol tartrate  100 mg Oral BID   pantoprazole  20 mg Oral Daily   patiromer  8.4 g Oral Daily   sodium bicarbonate  650 mg Oral BID   acetaminophen **OR** acetaminophen, alteplase, heparin, hydrALAZINE, lactulose, loperamide, OLANZapine, ondansetron, polyethylene glycol  Assessment/ Plan:  [name] is a 77 y.o.  female with end stage renal disease on peritoneal dialysis, hypertension, GERD, gout, hyeprlipidemia, diabetes mellitus type II and vertigo who presents to [facility] on 09/17/2023 for Encephalopathy [G93.40]  [facility]/TTS/right chest PermCath  End Stage Renal Disease: Will receive dialysis later today. UF goal 1.5-2L as tolerated. Next treatment scheduled for Thursday.   Anemia of chronic kidney disease: macrocytic Lab Results  Component Value Date   HGB 9.0 (L) 09/17/2023  Hgb borderline. Patient receives Mircera outpatient.   3. Hypertension with chronic kidney disease: Currently receiving amlodipine and metoprolol.  4. Diabetes mellitus type II with chronic kidney disease: insulin dependent.  Hemoglobin A1c 7.2 on 9//24. - Continue glucose control, per primary team    LOS: 0 Ira Busbin 10/22/202412:30 PM

## 2023-09-17 NOTE — ED Triage Notes (Addendum)
Patient arrives from home with complaint of hypoglycemia.  Husband called because of altered mental status.  EMS reports CBG of 58 on scene.  She was given of D10.  EMS reports last CBG 255.  Husband said last week it did this, and he gave her orange juice and she was fine.  This time it did not work.  They also report that the patient's abdomen is more distended than usual.  Patient is supposed to be at dialysis this morning at [facility]

## 2023-09-18 ENCOUNTER — Ambulatory Visit: Payer: Medicare Other | Admitting: Urology

## 2023-09-18 DIAGNOSIS — S065XAA Traumatic subdural hemorrhage with loss of consciousness status unknown, initial encounter: Secondary | ICD-10-CM

## 2023-09-18 DIAGNOSIS — G9341 Metabolic encephalopathy: Secondary | ICD-10-CM | POA: Diagnosis not present

## 2023-09-18 DIAGNOSIS — W19XXXA Unspecified fall, initial encounter: Secondary | ICD-10-CM

## 2023-09-18 LAB — CBC
HCT: 28 % — ABNORMAL LOW (ref 36.0–46.0)
Hemoglobin: 8.9 g/dL — ABNORMAL LOW (ref 12.0–15.0)
MCH: 32.7 pg (ref 26.0–34.0)
MCHC: 31.8 g/dL (ref 30.0–36.0)
MCV: 102.9 fL — ABNORMAL HIGH (ref 80.0–100.0)
Platelets: 343 10*3/uL (ref 150–400)
RBC: 2.72 MIL/uL — ABNORMAL LOW (ref 3.87–5.11)
RDW: 16.8 % — ABNORMAL HIGH (ref 11.5–15.5)
WBC: 10.3 10*3/uL (ref 4.0–10.5)
nRBC: 0.2 % (ref 0.0–0.2)

## 2023-09-18 LAB — CBG MONITORING, ED
Glucose-Capillary: 52 mg/dL — ABNORMAL LOW (ref 70–99)
Glucose-Capillary: 66 mg/dL — ABNORMAL LOW (ref 70–99)
Glucose-Capillary: 95 mg/dL (ref 70–99)
Glucose-Capillary: 142 mg/dL — ABNORMAL HIGH (ref 70–99)

## 2023-09-18 LAB — BASIC METABOLIC PANEL
Anion gap: 12 (ref 5–15)
BUN: 25 mg/dL — ABNORMAL HIGH (ref 8–23)
CO2: 25 mmol/L (ref 22–32)
Calcium: 8.4 mg/dL — ABNORMAL LOW (ref 8.9–10.3)
Chloride: 94 mmol/L — ABNORMAL LOW (ref 98–111)
Creatinine, Ser: 4.68 mg/dL — ABNORMAL HIGH (ref 0.44–1.00)
GFR, Estimated: 9 mL/min — ABNORMAL LOW (ref >60)
Glucose, Bld: 69 mg/dL — ABNORMAL LOW (ref 70–99)
Potassium: 3.6 mmol/L (ref 3.5–5.1)
Sodium: 131 mmol/L — ABNORMAL LOW (ref 135–145)

## 2023-09-18 LAB — GLUCOSE, CAPILLARY
Glucose-Capillary: 193 mg/dL — ABNORMAL HIGH (ref 70–99)
Glucose-Capillary: 195 mg/dL — ABNORMAL HIGH (ref 70–99)
Glucose-Capillary: 216 mg/dL — ABNORMAL HIGH (ref 70–99)
Glucose-Capillary: 227 mg/dL — ABNORMAL HIGH (ref 70–99)

## 2023-09-18 MED ORDER — DEXTROSE-SODIUM CHLORIDE 5-0.9 % IV SOLN: INTRAVENOUS | Status: AC

## 2023-09-18 MED ORDER — NYSTATIN 100000 UNIT/GM EX POWD
Freq: Two times a day (BID) | CUTANEOUS | Status: DC
  Filled 2023-09-18: qty 15

## 2023-09-18 NOTE — TOC Initial Note (Signed)
Transition of Care [facility]) - Initial/Assessment Note    Patient Details  Name: Heather Lopez MRN: [id number] Date of Birth: 1946/09/10  Transition of Care [facility]) CM/SW Contact:    Marquita Palms, LCSW Phone Number: 09/18/2023, 9:18 AM  Clinical Narrative:                  CSW received message from [facility] health and they reported patient is currently with the agency RN and OT.       Patient Goals and CMS Choice            Expected Discharge Plan and Services                                              Prior Living Arrangements/Services                       Activities of Daily Living   ADL Screening (condition at time of admission) Independently performs ADLs?: No Does the patient have a NEW difficulty with bathing/dressing/toileting/self-feeding that is expected to last >3 days?: No Does the patient have a NEW difficulty with getting in/out of bed, walking, or climbing stairs that is expected to last >3 days?: No Does the patient have a NEW difficulty with communication that is expected to last >3 days?: No Is the patient deaf or have difficulty hearing?: No Does the patient have difficulty seeing, even when wearing glasses/contacts?: No Does the patient have difficulty concentrating, remembering, or making decisions?: No  Permission Sought/Granted                  Emotional Assessment              Admission diagnosis:  Hyponatremia [E87.1] Hypoglycemia [E16.2] Encephalopathy [G93.40] Acute UTI [N39.0] Injury of head, initial encounter [S09.90XA] Fall, initial encounter [W19.XXXA] Altered mental status, unspecified altered mental status type [R41.82] Patient Active Problem List   Diagnosis Date Noted   AMS (altered mental status) 09/17/2023   Encephalopathy 09/17/2023   Peritonitis (HCC) 08/05/2023   ESRD on peritoneal dialysis (HCC) 07/31/2023   SBP (spontaneous bacterial peritonitis) (HCC) 07/30/2023   Pseudomonas  urinary tract infection 02/20/2022   Decubitus ulcer of sacral region, stage 4 (HCC) 12/19/2021   Lactic acidosis 12/19/2021   Status post placement of ureteral stent 12/09/2021 12/19/2021   AKI (acute kidney injury) (HCC) 12/19/2021   Decubitus ulcer of sacral region, stage 1 12/13/2021   UTI (urinary tract infection) 12/02/2021   Chronic osteomyelitis of coccyx (HCC) 12/02/2021   Abdominal pain 12/02/2021   Nausea & vomiting 12/02/2021   Hyperkalemia 12/02/2021   Hyponatremia 12/02/2021   Hyperglycemia due to diabetes mellitus (HCC) 12/02/2021   Elevated alkaline phosphatase level 12/02/2021   Elevated lipase 12/02/2021   Hypoalbuminemia due to protein-calorie malnutrition (HCC) 12/02/2021   Obesity (BMI 30-39.9) 12/02/2021   Anxiety 10/10/2021   Primary localized osteoarthritis of pelvic region and thigh 10/10/2021   Benign hypertensive kidney disease with chronic kidney disease 09/26/2021   Secondary hyperparathyroidism of renal origin (HCC) 09/26/2021   Hypokalemia 07/20/2021   Renal abscess    Essential hypertension    Lethargy    Duodenal ulcer perforation (HCC)    Demand ischemia of myocardium (HCC)    Atrial flutter with rapid ventricular response (HCC) 07/07/2021   Endotracheally intubated 07/07/2021   Bilateral nephrolithiasis  07/07/2021   Acute on chronic anemia 07/07/2021   Pressure injury of skin 06/30/2021   Pneumoperitoneum    Acute respiratory failure with hypoxia (HCC) 06/27/2021   Severe sepsis with septic shock (HCC) 06/27/2021   Acute lower UTI 06/27/2021   Acute kidney injury superimposed on CKD (HCC) 06/27/2021   Diabetes mellitus, type II (HCC) 03/03/2018   Vitamin D deficiency 08/21/2016   IBS (irritable bowel syndrome) 08/21/2016   Special screening for malignant neoplasms, colon    Benign neoplasm of transverse colon    Benign neoplasm of sigmoid colon    T2DM (type 2 diabetes mellitus) (HCC) 07/12/2015   Hyperlipidemia associated with type 2  diabetes mellitus (HCC) 07/12/2015   Depression 07/12/2015   BMI 40.0-44.9, adult (HCC) 07/12/2015   PCP:  Carren Rang, PA-C Pharmacy:   [facility] - [place], [place] - [address] [place] [address] Phone: [phone] Fax: [phone]  [id number] - [place], [place] - [address] AT [facility] [address] [facility] [address] [place] [address] Phone: [phone] Fax: [phone]  [id number] - [place], [place] - [address] [place] [address] Phone: [phone] Fax: [phone]     Social Determinants of Health (SDOH) Social History: SDOH Screenings   Food Insecurity: No Food Insecurity (09/17/2023)  Housing: Low Risk  (09/17/2023)  Transportation Needs: No Transportation Needs (09/17/2023)  Utilities: Not At Risk (09/17/2023)  Depression (PHQ2-9): Low Risk  (03/01/2022)  Financial Resource Strain: Low Risk  (07/14/2023)   Received from [facility]  Physical Activity: Unknown (07/14/2023)   Received from [facility]  Social Connections: Socially Integrated (07/14/2023)   [facility] from [facility]  Stress: No Stress Concern Present (07/14/2023)   Received from [facility]  Tobacco Use: Medium Risk (09/17/2023)   SDOH Interventions:     Readmission Risk Interventions    08/02/2023    3:12 PM 12/20/2021    1:52 PM 12/03/2021   11:26 AM  Readmission Risk Prevention Plan  Transportation Screening Complete Complete Complete  PCP or Specialist Appt within 3-5 Days Complete    HRI or Home Care Consult Complete Complete   Social Work Consult for Recovery Care Planning/Counseling Complete Complete   Palliative Care Screening Not Applicable Complete   Medication Review Oceanographer) Complete Complete Complete  PCP or Specialist appointment within 3-5 days of discharge   Complete  SW Recovery Care/Counseling Consult   Complete   Palliative Care Screening   Not Applicable  Skilled Nursing Facility   Complete

## 2023-09-18 NOTE — Progress Notes (Addendum)
Progress Note   Patient: Heather Lopez ZOX:[id number] DOB: 29-Aug-1946 DOA: 09/17/2023     1 DOS: the patient was seen and examined on 09/18/2023   Brief hospital course:  Heather Lopez is a 77 y.o. female with medical history significant of ESRD on HD recently switched from PD to HD TTS, HTN, HLD, gout, GERD, IDDM, obesity, brought in by family member for acute confusion.   Husband at bedside gave history.  Patient had first episode of agitation and confusion 2 days ago, patient woke up around 1 AM on Monday, started to have agitation with significant visual and hearing hallucinations, husband thought the patient glucose was low and felt her some orange juice and her symptoms subsided lasted for within 1 hour in the ED husband was able to calm her down.  No events or day yesterday.  This morning around 1 AM again patient woke up with significant screaming and visual hallucination.  Yesterday daytime patient also complained about lower abdominal pain.  But denies any diarrhea or dysuria.  Patient switched to HD last month due to repeated SBP associated with PD.  Last HD was on Saturday.   ED Course: Afebrile, no tachycardia blood pressure 140/50 O2 saturation 95% on room air.  UA showed hematuria with pyuria.  WBC 12.2, sodium 126, potassium 3.5, creatinine 6.5, BUN 40.   CT head positive finding for trace parafalcine subdural hematoma    Assessment and Plan:  Acute metabolic encephalopathy -Secondary to UTI and hypoglycemia --Patient appears to be back to her baseline mental status -- Urine culture yields greater than 100,000 colonies of Klebsiella pneumoniae, sensitivity pending -- Hypoglycemia persistent patient has been started on dextrose containing fluids --Expect improvement in patient's mental status following treatment of acute illness    Status post Fall Subdural hematoma -Small parafalcine subdural hematoma less than 3 mm was seen on today's CT.   --Husband unsure about patient had  fall at home.  Neuroexam nonfocal.   Neurosurgery reviewed the films and recommend no indication to repeat CT head.    UTI with hematuria Urine culture yields Klebsiella Pneumoniae, sensitivity pending Continue ceftriaxone   Hyponatremia -Chronic, baseline 127-131   Hypokalemia -PO replacement, repeat level in AM>   IDDM, with hypoglycemia -Hold insulin -- Start patient on dextrose containing fluids    HTN -Continue metoprolol -Given there is some worsening of mentation, will hold off clonidine.     ESRD on HD TTS -Nephrology consult appreciated   Chronic sacral pressure ulcer -Appears to be stable     Subjective: Patient is seen and examined at the bedside.  She is more awake and alert.  Physical Exam: Vitals:   09/18/23 1150 09/18/23 1200 09/18/23 1300 09/18/23 1400  BP:  (!) 134/105 (!) 159/75 (!) 119/54  Pulse:  72 75 73  Resp:  17 19 20   Temp: 98.5 F (36.9 C)     TempSrc: Oral     SpO2:  94% 96% 93%  Weight:      Height:       Eyes: PERRL, lids and conjunctivae normal ENMT: Mucous membranes are moist. Posterior pharynx clear of any exudate or lesions.Normal dentition.  Neck: normal, supple, no masses, no thyromegaly Respiratory: clear to auscultation bilaterally, no wheezing, no crackles. Normal respiratory effort. No accessory muscle use.  Cardiovascular: Regular rate and rhythm, no murmurs / rubs / gallops. No extremity edema. 2+ pedal pulses. No carotid bruits.  Abdomen: Distended, mild tenderness on the suprapubic area, no rebound no  guarding, no masses palpated. No hepatosplenomegaly. Bowel sounds positive.  Musculoskeletal: no clubbing / cyanosis. No joint deformity upper and lower extremities. Good ROM, no contractures. Normal muscle tone.  Skin: no rashes, lesions, ulcers. No induration Neurologic: No facial droops, following commands. Psychiatric: Awake, oriented to person and place      Data Reviewed: Labs reviewed. There are no new  results to review at this time.  Family Communication: Plan of care discussed with patient's husband at the bedside.  He verbalizes understanding and agrees with the plan.  Disposition: Status is: Inpatient Remains inpatient appropriate because: Requires IV antibiotics for UTI  Planned Discharge Destination:  TBD    Time spent: 36 minutes  Author: Lucile Shutters, MD 09/18/2023 2:50 PM  For on call review [email or web address].

## 2023-09-18 NOTE — Progress Notes (Signed)
[facility]  ROUNDING NOTE   Subjective:   Ms. LELIA LAMPINEN was admitted to [facility] on 09/17/2023 for Hyponatremia [E87.1] Hypoglycemia [E16.2] Encephalopathy [G93.40] Acute UTI [N39.0] Injury of head, initial encounter [S09.90XA] Fall, initial encounter [W19.XXXA] Altered mental status, unspecified altered mental status type [R41.82].  She is known to our practice and receives outpatient dialysis treatments at [facility] on a TTS schedule.  Last treatment completed on Saturday.  Update Patient seen resting in bed, husband at bedside Patient states she feels "blah" Reports vague discomfort from lower abdomen   Objective:  Vital signs in last 24 hours:  Temp:  [98.2 F (36.8 C)-100.1 F (37.8 C)] 98.5 F (36.9 C) (10/23 1150) Pulse Rate:  [72-95] 75 (10/23 1300) Resp:  [15-28] 19 (10/23 1300) BP: (106-162)/(52-105) 159/75 (10/23 1300) SpO2:  [89 %-100 %] 96 % (10/23 1300) Weight:  [95.4 kg] 95.4 kg (10/22 1320)  Weight change:  Filed Weights   09/17/23 1320  Weight: 95.4 kg     Intake/Output: I/O last 3 completed shifts: In: -  Out: 1300 [Other:1300]   Intake/Output this shift:  Total I/O In: 200 [IV Piggyback:200] Out: -   Physical Exam: General: NAD  Head: Normocephalic, atraumatic. Moist oral mucosal membranes  Eyes: Anicteric  Lungs:  Clear to auscultation  Heart: Regular rate and rhythm  Abdomen:  Soft, nontender  Extremities:  no peripheral edema.   Neurologic: Alert, moving all four extremities  Skin: No lesions  Access: Rt chest permcath    Basic Metabolic Panel: Recent Labs  Lab 09/17/23 0530 09/18/23 0314  NA 126* 131*  K 3.5 3.6  CL 89* 94*  CO2 25 25  GLUCOSE 165* 69*  BUN 40* 25*  CREATININE 6.56* 4.68*  CALCIUM 8.5* 8.4*  MG 2.3  --     Liver Function Tests: Recent Labs  Lab 09/17/23 0530  AST 36  ALT 33  ALKPHOS 113  BILITOT 0.6  PROT 7.2  ALBUMIN 2.7*   Recent Labs  Lab 09/17/23 0530  LIPASE 23    No results for input(s): "AMMONIA" in the last 168 hours.  CBC: Recent Labs  Lab 09/17/23 0530 09/18/23 0314  WBC 12.2* 10.3  NEUTROABS 9.7*  --   HGB 9.0* 8.9*  HCT 28.2* 28.0*  MCV 102.9* 102.9*  PLT 355 343    Cardiac Enzymes: No results for input(s): "CKTOTAL", "CKMB", "CKMBINDEX", "TROPONINI" in the last 168 hours.  BNP: Invalid input(s): "POCBNP"  CBG: Recent Labs  Lab 09/17/23 2057 09/18/23 0748 09/18/23 0817 09/18/23 0840 09/18/23 1148  GLUCAP 92 52* 66* 95 142*    Microbiology: Results for orders placed or performed during the hospital encounter of 09/17/23  Urine Culture     Status: Abnormal (Preliminary result)   Collection Time: 09/17/23  5:50 AM   Specimen: Urine, Clean Catch  Result Value Ref Range Status   Specimen Description   Final    URINE, CLEAN CATCH Performed at [facility], [address], [place], [place] [address]    Special Requests   Final    NONE Performed at [facility], [address] [place], [place] [address]    Culture (A)  Final    >=100,000 COLONIES/mL KLEBSIELLA PNEUMONIAE SUSCEPTIBILITIES TO FOLLOW Performed at [facility], [address] [place], [place] [address]    Report Status PENDING  Incomplete    Coagulation Studies: No results for input(s): "LABPROT", "INR" in the last 72 hours.  Urinalysis: Recent Labs  09/17/23 0550  COLORURINE YELLOW*  LABSPEC 1.006  PHURINE 8.0  GLUCOSEU NEGATIVE  HGBUR SMALL*  BILIRUBINUR NEGATIVE  KETONESUR NEGATIVE  PROTEINUR 100*  NITRITE NEGATIVE  LEUKOCYTESUR LARGE*      Imaging: [place] ASCITES (ABDOMEN LIMITED)  Result Date: 09/17/2023 CLINICAL DATA:  Ascites search EXAM: LIMITED ABDOMEN ULTRASOUND FOR ASCITES TECHNIQUE: Limited ultrasound survey for ascites was performed in all four abdominal quadrants. COMPARISON:  None Available. FINDINGS: No abdominal ascites. Partially visualized abdominal structures are unremarkable. IMPRESSION:  No evidence of abdominal ascites. Electronically Signed   By: Allegra Lai M.D.   On: 09/17/2023 11:48   CT ABDOMEN PELVIS WO CONTRAST  Result Date: 09/17/2023 CLINICAL DATA:  Altered mental status.  Pain EXAM: CT ABDOMEN AND PELVIS WITHOUT CONTRAST TECHNIQUE: Multidetector CT imaging of the abdomen and pelvis was performed following the standard protocol without IV contrast. RADIATION DOSE REDUCTION: This exam was performed according to the departmental dose-optimization program which includes automated exposure control, adjustment of the mA and/or kV according to patient size and/or use of iterative reconstruction technique. COMPARISON:  CT 07/30/2023 and older FINDINGS: Lower chest: There is some linear opacity lung bases likely scar or atelectasis. No pleural effusion. Trace pericardial effusion. Hepatobiliary: On this non IV contrast exam, the liver parenchyma is grossly preserved. Gallbladder is nondilated. Pancreas: Unremarkable. No pancreatic ductal dilatation or surrounding inflammatory changes. Spleen: Lobular spleen.  Stable. Adrenals/Urinary Tract: Adrenal glands are preserved. Both kidneys show moderate renal collecting system dilatation, similar to previous. There is air within the left renal collecting system. Moderate bilateral perinephric stranding. There is also a right-sided ureteral stent with pigtail in the anterior right mid kidney collecting system and distal in the bladder. Stable caliber of the right ureter. Mild wall thickening of the urinary bladder with slight stranding. Please correlate with clinical history. Stomach/Bowel: On this non oral contrast exam, the large bowel has a normal course and caliber. Moderate right-sided colonic stool. Slightly redundant course of the transverse colon. Normal appendix in the right hemipelvis posterior to the cecum. Few left-sided colonic diverticula. The stomach and small bowel are nondilated. Vascular/Lymphatic: Normal caliber aorta and IVC  with scattered vascular calcifications. There are some small nodes identified along retroperitoneum. These are unchanged from previous. No new lymph node enlargement. Reproductive: Multiple calcified uterine fibroids. No separate adnexal mass. Other: No free intra-air identified. Trace areas of fluid but decreased from the prior CT scan. Musculoskeletal: Scattered degenerative changes of the spine and pelvis, moderate. Multilevel canal stenosis along the lumbar spine. Degenerative changes about the hips are particularly advanced. IMPRESSION: Stable moderate bilateral renal collecting system dilatation. Indwelling right-sided ureteral stent. There is some air in the bladder and left renal collecting system. Please correlate with any recent instrumentation. Bladder wall is also slightly thickened with some adjacent stranding. Previous peritoneal dialysis catheter no longer identified. Decreasing ascites. Nonspecific bowel gas pattern with moderate colonic stool. Normal appendix. Electronically Signed   By: Karen Kays M.D.   On: 09/17/2023 10:24   CT HEAD WO CONTRAST ( )  Addendum Date: 09/17/2023   ADDENDUM REPORT: 09/17/2023 08:35 ADDENDUM: Study discussed by telephone with Dr. Gwendolyn Grant on 09/17/2023 at 0805 hours. Electronically Signed   By: Odessa Fleming M.D.   On: 09/17/2023 08:35   Result Date: 09/17/2023 CLINICAL DATA:  77 year old female with hypoglycemia, altered mental status. EXAM: CT HEAD WITHOUT CONTRAST TECHNIQUE: Contiguous axial images were obtained from the base of the skull through the vertex without intravenous contrast. RADIATION DOSE REDUCTION: This  exam was performed according to the departmental dose-optimization program which includes automated exposure control, adjustment of the mA and/or kV according to patient size and/or use of iterative reconstruction technique. COMPARISON:  Brain MRI andHead CT 01/27/2014. FINDINGS: Brain: Small or trace para falcine subdural blood, 3 mm in  thickness (series 3, image 26). No intraventricular hemorrhage. No other acute intracranial hemorrhage identified. No midline shift or intracranial mass effect. No ventriculomegaly. Some generalized cerebral volume loss since 2015. No cortically based acute infarct identified. Mild for age patchy white matter hypodensity. Vascular: No suspicious intracranial vascular hyperdensity. Calcified atherosclerosis at the skull base. Skull: No fracture identified. Sinuses/Orbits: Visualized paranasal sinuses and mastoids are stable and well aerated. Other: Postoperative changes to both globes since 2015. Some calcified scalp vessel atherosclerosis. Right posterior convexity broad-based scalp soft tissue contusion and/or mild hematoma (series 2, image 72). Underlying calvarium appears intact. IMPRESSION: 1. Positive for trace parafalcine Subdural hematoma, 3 mm in thickness. No intracranial mass effect. 2. No other acute intracranial hemorrhage or acute intracranial abnormality identified. 3. Evidence of right posterior scalp soft tissue injury. No skull fracture identified. Electronically Signed: By: Odessa Fleming M.D. On: 09/17/2023 07:59   CT Cervical Spine Wo Contrast  Result Date: 09/17/2023 CLINICAL DATA:  77 year old female with hypoglycemia, altered mental status. EXAM: CT CERVICAL SPINE WITHOUT CONTRAST TECHNIQUE: Multidetector CT imaging of the cervical spine was performed without intravenous contrast. Multiplanar CT image reconstructions were also generated. RADIATION DOSE REDUCTION: This exam was performed according to the departmental dose-optimization program which includes automated exposure control, adjustment of the mA and/or kV according to patient size and/or use of iterative reconstruction technique. COMPARISON:  Head CT today. FINDINGS: Alignment: Straightening and mild reversal of cervical lordosis. Multilevel mild degenerative appearing upper cervical spondylolisthesis. Cervicothoracic junction alignment  is within normal limits. Bilateral posterior element alignment is within normal limits. Skull base and vertebrae: Visualized skull base is intact. No atlanto-occipital dissociation. C1 and C2 appear chronically degenerated but intact and aligned. No acute osseous abnormality identified. Soft tissues and spinal canal: No prevertebral fluid or swelling. No visible canal hematoma. Partially retropharyngeal course of both carotids with calcified atherosclerosis. Right IJ approach dual lumen dialysis type catheter. Disc levels: Widespread advanced cervical spine degeneration, including upper cervical facet arthropathy superimposed on mild multilevel spondylolisthesis. Degenerative appearing ankylosis at both C6-C7, and at the left C7-T1 facets. Probable mild multilevel degenerative cervical spinal stenosis. Upper chest: Mild respiratory motion, otherwise negative. IMPRESSION: 1. No acute traumatic injury identified in the cervical spine. 2. Widespread advanced cervical spine degeneration. Electronically Signed   By: Odessa Fleming M.D.   On: 09/17/2023 08:03     Medications:    cefTRIAXone (ROCEPHIN)  IV Stopped (09/18/23 1016)   dextrose 5 % and 0.9 % NaCl 40 mL/hr at 09/18/23 1016    amLODipine  5 mg Oral Daily   aspirin EC  81 mg Oral Daily   atorvastatin  20 mg Oral Daily   buPROPion  150 mg Oral Daily   busPIRone  5 mg Oral Daily   Chlorhexidine Gluconate Cloth  6 each Topical Q0600   ferrous sulfate  325 mg Oral Q breakfast   fesoterodine  4 mg Oral Daily   heparin  5,000 Units Subcutaneous Q12H   metoprolol tartrate  100 mg Oral BID   pantoprazole  20 mg Oral Daily   sodium bicarbonate  650 mg Oral BID   acetaminophen **OR** acetaminophen, hydrALAZINE, lactulose, loperamide, OLANZapine, ondansetron, polyethylene glycol  Assessment/ Plan:  Ms. ZUELLA CLONEY is a 77 y.o.  female with end stage renal disease on peritoneal dialysis, hypertension, GERD, gout, hyeprlipidemia, diabetes mellitus type II and  vertigo who presents to [facility] on 09/17/2023 for Hyponatremia [E87.1] Hypoglycemia [E16.2] Encephalopathy [G93.40] Acute UTI [N39.0] Injury of head, initial encounter [S09.90XA] Fall, initial encounter [W19.XXXA] Altered mental status, unspecified altered mental status type [R41.82]  [facility]/TTS/right chest PermCath  End Stage Renal Disease: Dialysis received yesterday, tolerated well.  UF 1.3 L achieved.  Next treatment scheduled for Thursday.  Anemia of chronic kidney disease: macrocytic Lab Results  Component Value Date   HGB 8.9 (L) 09/18/2023  Patient receives Mircera outpatient.   3. Hypertension with chronic kidney disease: Currently receiving amlodipine and metoprolol.  Blood pressure acceptable for this patient, 147/63.  4. Diabetes mellitus type II with chronic kidney disease: insulin dependent.  Hemoglobin A1c 7.2 on 9//24. -Glucose well-controlled.    LOS: 1 Aairah Negrette 10/23/20241:16 PM

## 2023-09-18 NOTE — Consult Note (Signed)
Consult requested by:  Dr. Derrill Kay  Consult requested for:  Small subdural hematoma  Primary Physician:  Carren Rang, PA-C  History of Present Illness: 09/18/2023 [name] is here today with a chief complaint of altered mental status.  She has extensive past medical history and is on hemodialysis for end-stage renal disease.  She was brought in for confusion that started approximately 3 days ago.  She was diagnosed with a urinary tract infection.  She had had a fall but it occurred a couple of days ago.  Since she has started treatment for her urinary tract infection, her mental status has improved.  She was able to give me a full history.  She denies any headache, nausea, or vomiting.  She reported that she has cognition at her baseline at this point.   Review of Systems:  A 10 point review of systems is negative, except for the pertinent positives and negatives detailed in the HPI.  Past Medical History: Past Medical History:  Diagnosis Date   Acute renal failure (HCC) 06/27/2021   Anxiety    Aortic atherosclerosis (HCC)    Cataract    Depression    Dysrhythmia    GERD (gastroesophageal reflux disease)    Gout    History of kidney stones    Hyperlipidemia    Hypertension    Nausea & vomiting 12/02/2021   Obesity    Obstruction of left ureteropelvic junction (UPJ) due to stone 06/2021   Osteoarthritis    Perforated duodenal ulcer (HCC) 06/27/2021   Pyonephrosis 06/2021   left   Septic shock (HCC) 06/27/2021   Type 2 diabetes mellitus with stage 5 chronic kidney disease (HCC)    Vertigo     Past Surgical History: Past Surgical History:  Procedure Laterality Date   CAPD REMOVAL N/A 08/02/2023   Procedure: CONTINUOUS AMBULATORY PERITONEAL DIALYSIS  (CAPD) CATHETER REMOVAL;  Surgeon: Leafy Ro, MD;  Location: [facility];  Service: General;  Laterality: N/A;   CATARACT EXTRACTION, BILATERAL Bilateral 2019   COLONOSCOPY WITH PROPOFOL N/A 08/19/2015    Procedure: COLONOSCOPY WITH PROPOFOL;  Surgeon: Midge Minium, MD;  Location: [facility];  Service: Endoscopy;  Laterality: N/A;  diabetic - insulin   CYSTOSCOPY W/ RETROGRADES Bilateral 12/22/2019   Procedure: CYSTOSCOPY WITH RETROGRADE PYELOGRAM;  Surgeon: Riki Altes, MD;  Location: [facility];  Service: Urology;  Laterality: Bilateral;   CYSTOSCOPY W/ URETERAL STENT PLACEMENT Bilateral 07/05/2021   Procedure: CYSTOSCOPY WITH RETROGRADE PYELOGRAM/BILATERAL URETERAL STENT PLACEMENT;  Surgeon: Riki Altes, MD;  Location: [facility];  Service: Urology;  Laterality: Bilateral;   CYSTOSCOPY W/ URETERAL STENT PLACEMENT Bilateral 12/26/2021   Procedure: CYSTOSCOPY WITH STENT REMOVAL;  Surgeon: Riki Altes, MD;  Location: [facility];  Service: Urology;  Laterality: Bilateral;   CYSTOSCOPY W/ URETERAL STENT PLACEMENT Right 03/13/2022   Procedure: CYSTOSCOPY WITH RETROGRADE PYELOGRAM/URETERAL STENT PLACEMENT;  Surgeon: Riki Altes, MD;  Location: [facility];  Service: Urology;  Laterality: Right;   CYSTOSCOPY W/ URETERAL STENT PLACEMENT Right 01/01/2023   Procedure: CYSTOSCOPY WITH STENT EXCHANGE;  Surgeon: Riki Altes, MD;  Location: [facility];  Service: Urology;  Laterality: Right;   CYSTOSCOPY W/ URETERAL STENT REMOVAL Right 05/08/2022   Procedure: CYSTOSCOPY WITH STENT REMOVAL;  Surgeon: Riki Altes, MD;  Location: [facility];  Service: Urology;  Laterality: Right;   CYSTOSCOPY WITH BIOPSY N/A 12/22/2019   Procedure: CYSTOSCOPY WITH bladder BIOPSY;  Surgeon: Riki Altes, MD;  Location: [facility];  Service: Urology;  Laterality: N/A;   CYSTOSCOPY WITH BIOPSY  03/13/2022   Procedure: CYSTOSCOPY WITH BLADDER BIOPSY;  Surgeon: Riki Altes, MD;  Location: [facility];  Service: Urology;;   CYSTOSCOPY WITH STENT PLACEMENT Right 12/22/2019   Procedure: CYSTOSCOPY WITH STENT PLACEMENT;  Surgeon: Riki Altes, MD;  Location: [facility];  Service: Urology;  Laterality: Right;    CYSTOSCOPY WITH STENT PLACEMENT Right 07/03/2022   Procedure: CYSTOSCOPY/RIGHT URETEROSCOPY WITH STENT PLACEMENT;  Surgeon: Riki Altes, MD;  Location: [facility];  Service: Urology;  Laterality: Right;   CYSTOSCOPY WITH URETEROSCOPY Bilateral 12/22/2019   Procedure: CYSTOSCOPY WITH URETEROSCOPY;  Surgeon: Riki Altes, MD;  Location: [facility];  Service: Urology;  Laterality: Bilateral;   CYSTOSCOPY/URETEROSCOPY/HOLMIUM LASER/STENT PLACEMENT Right 12/22/2019   Procedure: CYSTOSCOPY/URETEROSCOPY/HOLMIUM LASER/STENT PLACEMENT;  Surgeon: Riki Altes, MD;  Location: [facility];  Service: Urology;  Laterality: Right;   CYSTOSCOPY/URETEROSCOPY/HOLMIUM LASER/STENT PLACEMENT Bilateral 12/08/2021   Procedure: CYSTOSCOPY/URETEROSCOPY/HOLMIUM LASER/STENT PLACEMENT;  Surgeon: Sondra Come, MD;  Location: [facility];  Service: Urology;  Laterality: Bilateral;   DIALYSIS/PERMA CATHETER INSERTION N/A 06/29/2021   Procedure: DIALYSIS/PERMA CATHETER INSERTION;  Surgeon: Annice Needy, MD;  Location: [facility];  Service: Cardiovascular;  Laterality: N/A;   DIALYSIS/PERMA CATHETER INSERTION N/A 07/13/2021   Procedure: DIALYSIS/PERMA CATHETER INSERTION;  Surgeon: Annice Needy, MD;  Location: [facility];  Service: Cardiovascular;  Laterality: N/A;   DIALYSIS/PERMA CATHETER INSERTION N/A 08/01/2023   Procedure: DIALYSIS/PERMA CATHETER INSERTION;  Surgeon: Annice Needy, MD;  Location: [facility];  Service: Cardiovascular;  Laterality: N/A;   EYE SURGERY     IR GASTROSTOMY TUBE MOD SED  08/01/2021   LAPAROTOMY N/A 06/29/2021   Procedure: EXPLORATORY LAPAROTOMY WITH REPAIR OF DUODENAL PERFORATION;  Surgeon: Henrene Dodge, MD;  Location: [facility];  Service: General;  Laterality: N/A;   POLYPECTOMY  08/19/2015   Procedure: POLYPECTOMY;  Surgeon: Midge Minium, MD;  Location: [facility];  Service: Endoscopy;;   TUBAL LIGATION  1992    Allergies: Allergies as of 09/17/2023 - Review  Complete 09/17/2023  Allergen Reaction Noted   Contrast media  [iodinated contrast media] Anaphylaxis 12/29/2018   Iodine Swelling 07/12/2015    Medications: Current Meds  Medication Sig   aspirin 81 MG EC tablet Take 81 mg by mouth daily.   atorvastatin (LIPITOR) 20 MG tablet Take 20 mg by mouth daily.   buPROPion (WELLBUTRIN XL) 150 MG 24 hr tablet Take 150 mg by mouth daily.   busPIRone (BUSPAR) 5 MG tablet Take 5 mg by mouth daily.   cloNIDine (CATAPRES) 0.1 MG tablet Take 1 tablet (0.1 mg total) by mouth 2 (two) times daily.   ferrous sulfate 325 (65 FE) MG tablet Take 325 mg by mouth daily with breakfast.   insulin aspart (NOVOLOG) 100 UNIT/ML injection Inject 10 Units into the skin 3 (three) times daily before meals.   insulin glargine (LANTUS) 100 UNIT/ML injection Inject 70 Units into the skin at bedtime.   lactulose (CHRONULAC) 10 GM/15ML solution Take 45 mLs (30 g total) by mouth 2 (two) times daily as needed for mild constipation or moderate constipation.   loperamide (IMODIUM A-D) 2 MG tablet Take 2 mg by mouth every 6 (six) hours as needed for diarrhea or loose stools.   metoprolol tartrate (LOPRESSOR) 100 MG tablet Take 1 tablet (100 mg total) by mouth 2 (two) times daily.   Multiple Vitamin (MULTIVITAMIN WITH MINERALS) TABS tablet Take  1 tablet by mouth daily.   ondansetron (ZOFRAN) 4 MG tablet Take 4 mg by mouth every 8 (eight) hours as needed.   polyethylene glycol (MIRALAX / GLYCOLAX) 17 g packet Take 17 g by mouth daily as needed for mild constipation or moderate constipation.   senna-docusate (SENOKOT-S) 8.6-50 MG tablet Take 1 tablet by mouth at bedtime as needed for mild constipation or moderate constipation.   sodium bicarbonate 650 MG tablet Take 650 mg by mouth 2 (two) times daily.   trospium (SANCTURA) 20 MG tablet Take 1 tablet (20 mg total) by mouth 2 (two) times daily.   VELTASSA 8.4 g packet Take 8.4 g by mouth daily. MIX 1 PACKET IN 1/3 CUP OF WATER AND DRINK  BY MOUTH DAILY.    Social History: Social History   Tobacco Use   Smoking status: Former    Current packs/day: 0.00    Average packs/day: 0.8 packs/day for 6.0 years (4.5 ttl pk-yrs)    Types: Cigarettes    Start date: 60    Quit date: 20    Years since quitting: 51.8   Smokeless tobacco: Never   Tobacco comments:    quit 1973  Vaping Use   Vaping status: Never Used  Substance Use Topics   Alcohol use: No    Alcohol/week: 0.0 standard drinks of alcohol   Drug use: No    Family Medical History: Family History  Problem Relation Age of Onset   Diabetes Father    Cancer Father        lung cancer   Diabetes Brother    Stroke Mother     Physical Examination: Vitals:   09/18/23 0600 09/18/23 0749  BP: (!) 147/63   Pulse: 80   Resp: (!) 22   Temp:  98.2 F (36.8 C)  SpO2: 93%     General: Patient is in no apparent distress. Attention to examination is appropriate.  Neck:   Supple.  Full range of motion.  Respiratory: Patient is breathing without any difficulty.   NEUROLOGICAL:     Awake, alert, oriented to person, place, and time.  Speech is clear and fluent.  Cranial Nerves: Pupils equal round and reactive to light.  Facial tone is symmetric.  Facial sensation is symmetric. Shoulder shrug is symmetric. Tongue protrusion is midline.  There is no pronator drift.  Strength: Side Biceps Triceps Deltoid Interossei Grip Wrist Ext. Wrist Flex.  R 5 5 5 5 5 5 5   L 5 5 5 5 5 5 5    Side Iliopsoas Quads Hamstring PF DF EHL  R 5 5 5 5 5 5   L 5 5 5 5 5 5    Bilateral upper and lower extremity sensation is intact to light touch.    No evidence of dysmetria noted.  Gait is untested.     Medical Decision Making  Imaging: CT Head 09/17/2023 IMPRESSION: 1. Positive for trace parafalcine Subdural hematoma, 3 mm in thickness. No intracranial mass effect. 2. No other acute intracranial hemorrhage or acute intracranial abnormality identified.   3. Evidence of  right posterior scalp soft tissue injury. No skull fracture identified.   Electronically Signed: By: Odessa Fleming M.D. On: 09/17/2023 07:59  I have personally reviewed the images and agree with the above interpretation.  Assessment and Plan: Ms. Eid is a pleasant 77 y.o. female with small subdural hematoma after a fall several days ago.  She is not on therapeutic anticoagulation.  Her mental status has improved with treatment of  her urinary tract infection.  At this point, I would not recommend further treatment.  I do not think she needs repeat CT scan unless she has a change in mental status or new trauma.  There is no role for surgery at this point.  We discussed the potential for follow-up examination.  If she has persistent headaches or any changes in her cognition, I will be happy to see her back in clinic for follow-up CT scan.  Given the small size of the CT scan, I do not feel strongly about follow-up unless she has development of new symptoms.  I would recommend against antiepileptic medication given that she had several days since her trauma and is at significant risk of polypharmacy given her age and other comorbid medical conditions.    I have communicated my recommendations to the requesting physician and coordinated care to facilitate these recommendations.     Ahnaf Caponi K. Myer Haff MD, [facility]

## 2023-09-18 NOTE — ED Notes (Addendum)
Patient finishing orange juice

## 2023-09-18 NOTE — ED Notes (Signed)
Patient given 8 oz of orange juice.

## 2023-09-19 DIAGNOSIS — G9341 Metabolic encephalopathy: Secondary | ICD-10-CM | POA: Diagnosis not present

## 2023-09-19 LAB — GLUCOSE, CAPILLARY
Glucose-Capillary: 111 mg/dL — ABNORMAL HIGH (ref 70–99)
Glucose-Capillary: 159 mg/dL — ABNORMAL HIGH (ref 70–99)
Glucose-Capillary: 184 mg/dL — ABNORMAL HIGH (ref 70–99)
Glucose-Capillary: 204 mg/dL — ABNORMAL HIGH (ref 70–99)
Glucose-Capillary: 236 mg/dL — ABNORMAL HIGH (ref 70–99)

## 2023-09-19 LAB — CBC
HCT: 27.5 % — ABNORMAL LOW (ref 36.0–46.0)
Hemoglobin: 8.7 g/dL — ABNORMAL LOW (ref 12.0–15.0)
MCH: 33 pg (ref 26.0–34.0)
MCHC: 31.6 g/dL (ref 30.0–36.0)
MCV: 104.2 fL — ABNORMAL HIGH (ref 80.0–100.0)
Platelets: 382 10*3/uL (ref 150–400)
RBC: 2.64 MIL/uL — ABNORMAL LOW (ref 3.87–5.11)
RDW: 16.8 % — ABNORMAL HIGH (ref 11.5–15.5)
WBC: 10 10*3/uL (ref 4.0–10.5)
nRBC: 0.2 % (ref 0.0–0.2)

## 2023-09-19 LAB — RENAL FUNCTION PANEL
Albumin: 2.5 g/dL — ABNORMAL LOW (ref 3.5–5.0)
Anion gap: 11 (ref 5–15)
BUN: 32 mg/dL — ABNORMAL HIGH (ref 8–23)
CO2: 24 mmol/L (ref 22–32)
Calcium: 8.4 mg/dL — ABNORMAL LOW (ref 8.9–10.3)
Chloride: 94 mmol/L — ABNORMAL LOW (ref 98–111)
Creatinine, Ser: 5.16 mg/dL — ABNORMAL HIGH (ref 0.44–1.00)
GFR, Estimated: 8 mL/min — ABNORMAL LOW (ref >60)
Glucose, Bld: 127 mg/dL — ABNORMAL HIGH (ref 70–99)
Phosphorus: 2.5 mg/dL (ref 2.5–4.6)
Potassium: 3.6 mmol/L (ref 3.5–5.1)
Sodium: 129 mmol/L — ABNORMAL LOW (ref 135–145)

## 2023-09-19 LAB — OSMOLALITY, URINE: Osmolality, Ur: 239 mosm/kg — ABNORMAL LOW (ref 300–900)

## 2023-09-19 LAB — SODIUM, URINE, RANDOM: Sodium, Ur: 100 mmol/L

## 2023-09-19 MED ORDER — ALTEPLASE 2 MG IJ SOLR
INTRAMUSCULAR | Status: AC
  Filled 2023-09-19: qty 4

## 2023-09-19 MED ORDER — STERILE WATER FOR INJECTION IJ SOLN
INTRAMUSCULAR | Status: AC
  Filled 2023-09-19: qty 10

## 2023-09-19 MED ORDER — ALTEPLASE 2 MG IJ SOLR
2.0000 mg | Freq: Once | INTRAMUSCULAR | Status: AC | PRN
  Administered 2023-09-19: 2 mg

## 2023-09-19 NOTE — Plan of Care (Signed)

## 2023-09-19 NOTE — Evaluation (Signed)
Physical Therapy Evaluation Patient Details Name: Heather Lopez MRN: [id number] DOB: [birth year] Today's Date: 09/19/2023  History of Present Illness  Pt is a 77 yo female that presented to the ED for AMS, agitation workup for hyponatremia, hypoglycemia, imaging showed trace parafalcine subdural hematoma, UTI.  PMH of HD,  HTN, HLD, gout, GERD, IDDM.   Clinical Impression  Pt alert, agreeable to PT, reported 3-6/10 pain at UTI site. Pt stated at baseline she is modI for ADLs, husband performs most IADLs, and utilizes a rollator for in home, and a WC for community distances. 1 fall in the last 6 months.  The patient/family and PT discussed some fall prevention strategies for their home, verbalized understanding. Supine to sit with HOB and use of bed rails, good sitting balance noted. Sit <> Stand with RW and supervision, and she was able to ambulate ~32ft with RW and supervision. Some SOB and fatigue noticeable, spO2 100%. Returned to supine with minA and needs in reach.  Overall the patient demonstrated deficits (see "PT Problem List") that impede the patient's functional abilities, safety, and mobility and would benefit from skilled PT intervention.          If plan is discharge home, recommend the following: Assistance with cooking/housework;Assist for transportation;Help with stairs or ramp for entrance   Can travel by private vehicle        Equipment Recommendations None recommended by PT  Recommendations for Other Services       Functional Status Assessment Patient has had a recent decline in their functional status and demonstrates the ability to make significant improvements in function in a reasonable and predictable amount of time.     Precautions / Restrictions Precautions Precautions: Fall Restrictions Weight Bearing Restrictions: No      Mobility  Bed Mobility Overal bed mobility: Needs Assistance Bed Mobility: Supine to Sit, Sit to Supine     Supine to sit: Contact  guard, HOB elevated, Used rails Sit to supine: Min assist, Used rails   General bed mobility comments: minA for BLE    Transfers Overall transfer level: Needs assistance Equipment used: Rolling walker (2 wheels) Transfers: Sit to/from Stand Sit to Stand: Supervision                Ambulation/Gait Ambulation/Gait assistance: Supervision Gait Distance (Feet): 30 Feet Assistive device: Rolling walker (2 wheels)         General Gait Details: some SOB and fatigue, no LOB  Stairs            Wheelchair Mobility     Tilt Bed    Modified Rankin (Stroke Patients Only)       Balance Overall balance assessment: Needs assistance Sitting-balance support: Feet supported Sitting balance-Leahy Scale: Good     Standing balance support: Bilateral upper extremity supported, During functional activity Standing balance-Leahy Scale: Fair                               Pertinent Vitals/Pain Pain Assessment Pain Assessment: 0-10 Pain Score: 6  Pain Location: pt reported pain due to UTI Pain Descriptors / Indicators: Burning, Sore Pain Intervention(s): Limited activity within patient's tolerance, Monitored during session, Repositioned    Home Living Family/patient expects to be discharged to:: Private residence Living Arrangements: Spouse/significant other Available Help at Discharge: Family;Available 24 hours/day Type of Home: House Home Access: Stairs to enter   [employer] of Steps: 1 small step Alternate Level Stairs-Number  of Steps: 18 Home Layout: Able to live on main level with bedroom/bathroom;Two level Home Equipment: BSC/3in1;Rolling Walker (2 wheels);Rollator (4 wheels);Wheelchair - manual;Grab bars - toilet;Shower seat - built in;Shower seat (bed rails)      Prior Function Prior Level of Function : Needs assist             Mobility Comments: amb short distances with RW/rollator and wheelchair for community amb; can self  propel in wheelchair but spouse helps occasionally ADLs Comments: mod I/I with bathing/dressing, assist for IADLs from husband     Extremity/Trunk Assessment   Upper Extremity Assessment Upper Extremity Assessment: Overall WFL for tasks assessed    Lower Extremity Assessment Lower Extremity Assessment: Generalized weakness       Communication      Cognition Arousal: Alert Behavior During Therapy: WFL for tasks assessed/performed Overall Cognitive Status: Within Functional Limits for tasks assessed                                          General Comments      Exercises     Assessment/Plan    PT Assessment Patient needs continued PT services  PT Problem List Decreased activity tolerance;Decreased mobility;Decreased balance       PT Treatment Interventions DME instruction;Neuromuscular re-education;Gait training;Stair training;Patient/family education;Functional mobility training;Therapeutic activities;Therapeutic exercise;Balance training    PT Goals (Current goals can be found in the Care Plan section)  Acute Rehab PT Goals Patient Stated Goal: to walk and breathe better PT Goal Formulation: With patient Time For Goal Achievement: 10/03/23 Potential to Achieve Goals: Good    Frequency Min 1X/week     Co-evaluation               AM-PAC PT "6 Clicks" Mobility  Outcome Measure Help needed turning from your back to your side while in a flat bed without using bedrails?: None Help needed moving from lying on your back to sitting on the side of a flat bed without using bedrails?: None Help needed moving to and from a bed to a chair (including a wheelchair)?: None Help needed standing up from a chair using your arms (e.g., wheelchair or bedside chair)?: None Help needed to walk in hospital room?: None Help needed climbing 3-5 steps with a railing? : A Little 6 Click Score: 23    End of Session Equipment Utilized During Treatment: Gait  belt Activity Tolerance: Patient tolerated treatment well Patient left: in bed;with call bell/phone within reach;with bed alarm set;with nursing/sitter in room (pt declined OOB to chair)   PT Visit Diagnosis: Other abnormalities of gait and mobility (R26.89);Muscle weakness (generalized) (M62.81);Difficulty in walking, not elsewhere classified (R26.2);History of falling (Z91.81)    Time: 1440-1456 PT Time Calculation (min) (ACUTE ONLY): 16 min   Charges:   PT Evaluation $PT Eval Low Complexity: 1 Low PT Treatments $Therapeutic Activity: 8-22 mins PT General Charges $$ ACUTE PT VISIT: 1 Visit         Olga Coaster PT, DPT 3:10 PM,09/19/23

## 2023-09-19 NOTE — Progress Notes (Signed)
Hemodialysis note  Received patient in bed to unit. Alert and oriented.  Informed consent signed and in chart.  Treatment initiated: 0826 Treatment completed: 1338  Patient tolerated well. Transported back to room, alert without acute distress.  Report given to patient's RN.   Access used: Right Chest HD permCath Access issues: High Arterial pressure. Cathflo instilled and let it dwell for an hour. Lines were switched.  Total UF removed: 1L Medication(s) given:  none  Post HD weight: 92 kg   Wolfgang Phoenix [facility]

## 2023-09-19 NOTE — Progress Notes (Signed)
[facility]  ROUNDING NOTE   Subjective:   [name] was admitted to [facility] on 09/17/2023 for Hyponatremia [E87.1] Hypoglycemia [E16.2] Encephalopathy [G93.40] Acute UTI [N39.0] Injury of head, initial encounter [S09.90XA] Fall, initial encounter [W19.XXXA] Altered mental status, unspecified altered mental status type [R41.82].  She is known to our practice and receives outpatient dialysis treatments at [facility] on a TTS schedule.  Last treatment completed on Saturday.  Update Patient seen and evaluated during dialysis   HEMODIALYSIS FLOWSHEET:  Blood Flow Rate (mL/min): 300 mL/min Arterial Pressure (mmHg): 30.1 mmHg Venous Pressure (mmHg): 156.56 mmHg TMP (mmHg): 16.77 mmHg Ultrafiltration Rate (mL/min): 543 mL/min Dialysate Flow Rate (mL/min): 0 ml/min Dialysis Fluid Bolus: Normal Saline  HD LPN reporting increased arterial line pressures Will cathflo permcath and dwell for 1 hour and attempt treatment again.  Pain in lower abd is improving    Objective:  Vital signs in last 24 hours:  Temp:  [98.3 F (36.8 C)-99.1 F (37.3 C)] 98.3 F (36.8 C) (10/24 0814) Pulse Rate:  [72-80] 80 (10/24 0930) Resp:  [15-20] 19 (10/24 0930) BP: (119-159)/(54-105) 157/68 (10/24 0930) SpO2:  [91 %-98 %] 97 % (10/24 0930) Weight:  [92.9 kg] 92.9 kg (10/24 0814)  Weight change:  Filed Weights   09/17/23 1320 09/19/23 0814  Weight: 95.4 kg 92.9 kg     Intake/Output: I/O last 3 completed shifts: In: 514.5 [P.O.:120; I.V.:194.5; IV Piggyback:200] Out: -    Intake/Output this shift:  No intake/output data recorded.  Physical Exam: General: NAD  Head: Normocephalic, atraumatic. Moist oral mucosal membranes  Eyes: Anicteric  Lungs:  Clear to auscultation  Heart: Regular rate and rhythm  Abdomen:  Soft, nontender  Extremities:  no peripheral edema.   Neurologic: Alert, moving all four extremities  Skin: No lesions  Access: Rt chest permcath     Basic Metabolic Panel: Recent Labs  Lab 09/17/23 0530 09/18/23 0314  NA 126* 131*  K 3.5 3.6  CL 89* 94*  CO2 25 25  GLUCOSE 165* 69*  BUN 40* 25*  CREATININE 6.56* 4.68*  CALCIUM 8.5* 8.4*  MG 2.3  --     Liver Function Tests: Recent Labs  Lab 09/17/23 0530  AST 36  ALT 33  ALKPHOS 113  BILITOT 0.6  PROT 7.2  ALBUMIN 2.7*   Recent Labs  Lab 09/17/23 0530  LIPASE 23   No results for input(s): "AMMONIA" in the last 168 hours.  CBC: Recent Labs  Lab 09/17/23 0530 09/18/23 0314 09/19/23 0828  WBC 12.2* 10.3 10.0  NEUTROABS 9.7*  --   --   HGB 9.0* 8.9* 8.7*  HCT 28.2* 28.0* 27.5*  MCV 102.9* 102.9* 104.2*  PLT 355 343 382    Cardiac Enzymes: No results for input(s): "CKTOTAL", "CKMB", "CKMBINDEX", "TROPONINI" in the last 168 hours.  BNP: Invalid input(s): "POCBNP"  CBG: Recent Labs  Lab 09/18/23 1933 09/18/23 2210 09/18/23 2352 09/19/23 0021 09/19/23 0354  GLUCAP 193* 227* 216* 204* 159*    Microbiology: Results for orders placed or performed during the hospital encounter of 09/17/23  Urine Culture     Status: Abnormal   Collection Time: 09/17/23  5:50 AM   Specimen: Urine, Clean Catch  Result Value Ref Range Status   Specimen Description   Final    URINE, CLEAN CATCH Performed at [facility], [address] [place], [place] [address]    Special Requests   Final    NONE Performed at [facility]  [facility], [address] [place], [place] [address]    Culture >=100,000 COLONIES/mL KLEBSIELLA PNEUMONIAE (A)  Final   Report Status 09/19/2023 FINAL  Final   Organism ID, Bacteria KLEBSIELLA PNEUMONIAE (A)  Final      Susceptibility   Klebsiella pneumoniae - MIC*    CEFAZOLIN <=4 SENSITIVE Sensitive     CEFEPIME <=0.12 SENSITIVE Sensitive     CEFTRIAXONE <=0.25 SENSITIVE Sensitive     CIPROFLOXACIN <=0.25 SENSITIVE Sensitive     GENTAMICIN <=1 SENSITIVE Sensitive     IMIPENEM <=0.25 SENSITIVE Sensitive      NITROFURANTOIN 64 INTERMEDIATE Intermediate     TRIMETH/SULFA <=20 SENSITIVE Sensitive     PIP/TAZO <=4 SENSITIVE Sensitive ug/mL    * >=100,000 COLONIES/mL KLEBSIELLA PNEUMONIAE    Coagulation Studies: No results for input(s): "LABPROT", "INR" in the last 72 hours.  Urinalysis: Recent Labs    09/17/23 0550  COLORURINE YELLOW*  LABSPEC 1.006  PHURINE 8.0  GLUCOSEU NEGATIVE  HGBUR SMALL*  BILIRUBINUR NEGATIVE  KETONESUR NEGATIVE  PROTEINUR 100*  NITRITE NEGATIVE  LEUKOCYTESUR LARGE*      Imaging: [place] ASCITES (ABDOMEN LIMITED)  Result Date: 09/17/2023 CLINICAL DATA:  Ascites search EXAM: LIMITED ABDOMEN ULTRASOUND FOR ASCITES TECHNIQUE: Limited ultrasound survey for ascites was performed in all four abdominal quadrants. COMPARISON:  None Available. FINDINGS: No abdominal ascites. Partially visualized abdominal structures are unremarkable. IMPRESSION: No evidence of abdominal ascites. Electronically Signed   By: Allegra Lai M.D.   On: 09/17/2023 11:48     Medications:    cefTRIAXone (ROCEPHIN)  IV Stopped (09/18/23 1016)    amLODipine  5 mg Oral Daily   aspirin EC  81 mg Oral Daily   atorvastatin  20 mg Oral Daily   buPROPion  150 mg Oral Daily   busPIRone  5 mg Oral Daily   Chlorhexidine Gluconate Cloth  6 each Topical Q0600   ferrous sulfate  325 mg Oral Q breakfast   fesoterodine  4 mg Oral Daily   heparin  5,000 Units Subcutaneous Q12H   metoprolol tartrate  100 mg Oral BID   nystatin   Topical BID   pantoprazole  20 mg Oral Daily   sodium bicarbonate  650 mg Oral BID   acetaminophen **OR** acetaminophen, hydrALAZINE, lactulose, loperamide, OLANZapine, ondansetron, polyethylene glycol  Assessment/ Plan:  [name] is a 77 y.o.  female with end stage renal disease on peritoneal dialysis, hypertension, GERD, gout, hyeprlipidemia, diabetes mellitus type II and vertigo who presents to [facility] on 09/17/2023 for Hyponatremia [E87.1] Hypoglycemia  [E16.2] Encephalopathy [G93.40] Acute UTI [N39.0] Injury of head, initial encounter [S09.90XA] Fall, initial encounter [W19.XXXA] Altered mental status, unspecified altered mental status type [R41.82]  [facility]/TTS/right chest PermCath  End Stage Renal Disease: Receiving dialysis today, cathflo dwell in progress due to increased arterial pressures. Will resume treatment after dwell. Next treatment scheduled for Thursday.   Anemia of chronic kidney disease: macrocytic Lab Results  Component Value Date   HGB 8.7 (L) 09/19/2023  Hgb 8.7. Patient receives Mircera outpatient.   3. Hypertension with chronic kidney disease: Currently receiving amlodipine and metoprolol.  Blood pressure 144/69 currently  4. Diabetes mellitus type II with chronic kidney disease: insulin dependent.  Hemoglobin A1c 7.2 on 9//24. -Primary team to manage sliding scale insulin    LOS: 2 Unique Searfoss 10/24/20249:45 AM

## 2023-09-19 NOTE — Progress Notes (Addendum)
Progress Note   Patient: Heather Lopez GEX:[id number] DOB: 10-05-1946 DOA: 09/17/2023     2 DOS: the patient was seen and examined on 09/19/2023   Brief hospital course:  Heather Lopez is a 77 y.o. female with medical history significant of ESRD on HD recently switched from PD to HD TTS, HTN, HLD, gout, GERD, IDDM, obesity, brought in by family member for acute confusion.   Husband at bedside gave history.  Patient had first episode of agitation and confusion 2 days ago, patient woke up around 1 AM on Monday, started to have agitation with significant visual and hearing hallucinations, husband thought the patient glucose was low and felt her some orange juice and her symptoms subsided lasted for within 1 hour in the ED husband was able to calm her down.  No events or day yesterday.  This morning around 1 AM again patient woke up with significant screaming and visual hallucination.  Yesterday daytime patient also complained about lower abdominal pain.  But denies any diarrhea or dysuria.  Patient switched to HD last month due to repeated SBP associated with PD.  Last HD was on Saturday.   ED Course: Afebrile, no tachycardia blood pressure 140/50 O2 saturation 95% on room air.  UA showed hematuria with pyuria.  WBC 12.2, sodium 126, potassium 3.5, creatinine 6.5, BUN 40.   CT head positive finding for trace parafalcine subdural hematoma     Assessment and Plan:   Acute metabolic encephalopathy Secondary to UTI and hypoglycemia Patient appears to be back to her baseline mental status Urine culture yields greater than 100,000 colonies of Klebsiella pneumoniae sensitive to cephalosporin Blood sugar this have stabilized Expect improvement in patient's mental status following treatment of acute illness       Status post Fall Subdural hematoma CT of the head showed Small parafalcine subdural hematoma less than 3 mm   Husband unsure about patient had fall at home.  Neuroexam nonfocal.    Neurosurgery reviewed the films and recommend no indication to repeat CT head. PT eval     UTI with hematuria Urine culture yields Klebsiella Pneumoniae, sensitive to cephalosporins Continue ceftriaxone     Hyponatremia Chronic, baseline 127-131 Follow-up urine and serum osmolality Follow-up urine sodium    Hypokalemia -PO replacement, repeat level in AM>   IDDM, with hypoglycemia Hold insulin Blood sugars improved on dextrose containing fluids Encourage oral intake Check blood sugars AC and HS      HTN -Continue metoprolol -Given there is some worsening of mentation, will hold off clonidine.      ESRD on HD TTS -Nephrology consult appreciated S/p dialysis today    Chronic sacral pressure ulcer -Appears to be stable     Subjective: Patient is seen and examined in the dialysis unit.  Continues to complain of dysuria  Physical Exam: Vitals:   09/19/23 1300 09/19/23 1330 09/19/23 1338 09/19/23 1410  BP: (!) 177/75 (!) 158/73 (!) 158/68 (!) 151/59  Pulse: 87 90 88 90  Resp: 20 (!) 21 20 18   Temp:   98.6 F (37 C) 98.2 F (36.8 C)  TempSrc:   Oral Oral  SpO2: 97% 92% 96% 97%  Weight:   92 kg   Height:       Eyes: PERRL, lids and conjunctivae normal ENMT: Mucous membranes are moist. Posterior pharynx clear of any exudate or lesions.Normal dentition.  Neck: normal, supple, no masses, no thyromegaly Respiratory: clear to auscultation bilaterally, no wheezing, no crackles. Normal respiratory effort. No  accessory muscle use.  Cardiovascular: Regular rate and rhythm, no murmurs / rubs / gallops. No extremity edema. 2+ pedal pulses. No carotid bruits.  Abdomen: Distended, mild tenderness on the suprapubic area, no rebound no guarding, no masses palpated. No hepatosplenomegaly. Bowel sounds positive.  Musculoskeletal: no clubbing / cyanosis. No joint deformity upper and lower extremities. Good ROM, no contractures. Normal muscle tone.  Skin: no rashes, lesions,  ulcers. No induration Neurologic: No facial droops, following commands. Psychiatric: Awake, oriented to person and place    Data Reviewed: Labs reviewed.  Sodium 129 There are no new results to review at this time.  Family Communication: Plan of care discussed with patient  Disposition: Status is: Inpatient Remains inpatient appropriate because: On IV antibiotics.  Needs PT eval  Planned Discharge Destination:  TBD    Time spent: 33 minutes  Author: Lucile Shutters, MD 09/19/2023 2:28 PM  For on call review [email or web address].

## 2023-09-19 NOTE — Plan of Care (Signed)
  Problem: Education: Goal: Ability to describe self-care measures that may prevent or decrease complications (Diabetes Survival Skills Education) will improve Outcome: Progressing   Problem: Coping: Goal: Ability to adjust to condition or change in health will improve Outcome: Progressing   Problem: Metabolic: Goal: Ability to maintain appropriate glucose levels will improve Outcome: Progressing   Problem: Nutritional: Goal: Maintenance of adequate nutrition will improve Outcome: Progressing   Problem: Education: Goal: Knowledge of General Education information will improve Description: Including pain rating scale, medication(s)/side effects and non-pharmacologic comfort measures Outcome: Progressing   Problem: Health Behavior/Discharge Planning: Goal: Ability to manage health-related needs will improve Outcome: Progressing

## 2023-09-20 DIAGNOSIS — G9341 Metabolic encephalopathy: Secondary | ICD-10-CM | POA: Diagnosis not present

## 2023-09-20 LAB — GLUCOSE, CAPILLARY
Glucose-Capillary: 184 mg/dL — ABNORMAL HIGH (ref 70–99)
Glucose-Capillary: 177 mg/dL — ABNORMAL HIGH (ref 70–99)
Glucose-Capillary: 183 mg/dL — ABNORMAL HIGH (ref 70–99)
Glucose-Capillary: 204 mg/dL — ABNORMAL HIGH (ref 70–99)

## 2023-09-20 LAB — URINE CULTURE: Culture: >=100000 — AB

## 2023-09-20 LAB — OSMOLALITY: Osmolality: 279 mosm/kg (ref 275–295)

## 2023-09-20 MED ORDER — SODIUM CHLORIDE 0.9 % IV SOLN
1.0000 g | INTRAVENOUS | Status: DC
  Administered 2023-09-21 – 2023-09-22 (×2): 1 g via INTRAVENOUS
  Filled 2023-09-20 (×2): qty 10

## 2023-09-20 MED ORDER — ALTEPLASE 2 MG IJ SOLR
INTRAMUSCULAR | Status: AC
  Filled 2023-09-20: qty 4

## 2023-09-20 MED ORDER — ALTEPLASE 2 MG IJ SOLR
2.0000 mg | Freq: Once | INTRAMUSCULAR | Status: AC
  Administered 2023-09-20: 2 mg
  Filled 2023-09-20: qty 2

## 2023-09-20 MED ORDER — STERILE WATER FOR INJECTION IJ SOLN
INTRAMUSCULAR | Status: AC
  Filled 2023-09-20: qty 10

## 2023-09-20 NOTE — Plan of Care (Signed)

## 2023-09-20 NOTE — Progress Notes (Signed)
[facility]  ROUNDING NOTE   Subjective:   [name] was admitted to [facility] on 09/17/2023 for Hyponatremia [E87.1] Hypoglycemia [E16.2] Encephalopathy [G93.40] Acute UTI [N39.0] Injury of head, initial encounter [S09.90XA] Fall, initial encounter [W19.XXXA] Altered mental status, unspecified altered mental status type [R41.82].  She is known to our practice and receives outpatient dialysis treatments at [facility] on a TTS schedule.  Last treatment completed on Saturday.  Update Patient sitting up in bed No family at bedside Tolerating small meals Denies pain or discomfort   Objective:  Vital signs in last 24 hours:  Temp:  [98.2 F (36.8 C)-98.6 F (37 C)] 98.3 F (36.8 C) (10/25 0805) Pulse Rate:  [81-92] 81 (10/25 0805) Resp:  [18-22] 20 (10/25 0805) BP: (151-177)/(59-75) 162/71 (10/25 0805) SpO2:  [92 %-98 %] 94 % (10/25 0805) Weight:  [92 kg] 92 kg (10/24 1338)  Weight change:  Filed Weights   09/17/23 1320 09/19/23 0814 09/19/23 1338  Weight: 95.4 kg 92.9 kg 92 kg     Intake/Output: I/O last 3 completed shifts: In: -  Out: 1000 [Other:1000]   Intake/Output this shift:  No intake/output data recorded.  Physical Exam: General: NAD  Head: Normocephalic, atraumatic. Moist oral mucosal membranes  Eyes: Anicteric  Lungs:  Clear to auscultation  Heart: Regular rate and rhythm  Abdomen:  Soft, nontender  Extremities:  no peripheral edema.   Neurologic: Alert, moving all four extremities  Skin: No lesions  Access: Rt chest permcath    Basic Metabolic Panel: Recent Labs  Lab 09/17/23 0530 09/18/23 0314 09/19/23 0828  NA 126* 131* 129*  K 3.5 3.6 3.6  CL 89* 94* 94*  CO2 25 25 24   GLUCOSE 165* 69* 127*  BUN 40* 25* 32*  CREATININE 6.56* 4.68* 5.16*  CALCIUM 8.5* 8.4* 8.4*  MG 2.3  --   --   PHOS  --   --  2.5    Liver Function Tests: Recent Labs  Lab 09/17/23 0530 09/19/23 0828  AST 36  --   ALT 33  --   ALKPHOS 113   --   BILITOT 0.6  --   PROT 7.2  --   ALBUMIN 2.7* 2.5*   Recent Labs  Lab 09/17/23 0530  LIPASE 23   No results for input(s): "AMMONIA" in the last 168 hours.  CBC: Recent Labs  Lab 09/17/23 0530 09/18/23 0314 09/19/23 0828  WBC 12.2* 10.3 10.0  NEUTROABS 9.7*  --   --   HGB 9.0* 8.9* 8.7*  HCT 28.2* 28.0* 27.5*  MCV 102.9* 102.9* 104.2*  PLT 355 343 382    Cardiac Enzymes: No results for input(s): "CKTOTAL", "CKMB", "CKMBINDEX", "TROPONINI" in the last 168 hours.  BNP: Invalid input(s): "POCBNP"  CBG: Recent Labs  Lab 09/19/23 0354 09/19/23 1406 09/19/23 1846 09/19/23 2224 09/20/23 0806  GLUCAP 159* 111* 236* 184* 184*    Microbiology: Results for orders placed or performed during the hospital encounter of 09/17/23  Urine Culture     Status: Abnormal   Collection Time: 09/17/23  5:50 AM   Specimen: Urine, Clean Catch  Result Value Ref Range Status   Specimen Description   Final    URINE, CLEAN CATCH Performed at [facility], [address], [place], [place] [address]    Special Requests   Final    NONE Performed at [facility], [address], [place], [place] [address]    Culture >=100,000 COLONIES/mL KLEBSIELLA PNEUMONIAE (A)  Final   Report Status 09/20/2023 FINAL  Final   Organism ID, Bacteria KLEBSIELLA PNEUMONIAE (A)  Final      Susceptibility   Klebsiella pneumoniae - MIC*    AMPICILLIN >=32 RESISTANT Resistant     CEFAZOLIN <=4 SENSITIVE Sensitive     CEFEPIME <=0.12 SENSITIVE Sensitive     CEFTRIAXONE <=0.25 SENSITIVE Sensitive     CIPROFLOXACIN <=0.25 SENSITIVE Sensitive     GENTAMICIN <=1 SENSITIVE Sensitive     IMIPENEM <=0.25 SENSITIVE Sensitive     NITROFURANTOIN 64 INTERMEDIATE Intermediate     TRIMETH/SULFA <=20 SENSITIVE Sensitive     AMPICILLIN/SULBACTAM 4 SENSITIVE Sensitive     PIP/TAZO <=4 SENSITIVE Sensitive ug/mL    * >=100,000 COLONIES/mL KLEBSIELLA PNEUMONIAE    Coagulation Studies: No  results for input(s): "LABPROT", "INR" in the last 72 hours.  Urinalysis: No results for input(s): "COLORURINE", "LABSPEC", "PHURINE", "GLUCOSEU", "HGBUR", "BILIRUBINUR", "KETONESUR", "PROTEINUR", "UROBILINOGEN", "NITRITE", "LEUKOCYTESUR" in the last 72 hours.  Invalid input(s): "APPERANCEUR"     Imaging: No results found.   Medications:    cefTRIAXone (ROCEPHIN)  IV 1 g (09/20/23 1012)    amLODipine  5 mg Oral Daily   aspirin EC  81 mg Oral Daily   atorvastatin  20 mg Oral Daily   buPROPion  150 mg Oral Daily   busPIRone  5 mg Oral Daily   Chlorhexidine Gluconate Cloth  6 each Topical Q0600   ferrous sulfate  325 mg Oral Q breakfast   fesoterodine  4 mg Oral Daily   heparin  5,000 Units Subcutaneous Q12H   metoprolol tartrate  100 mg Oral BID   nystatin   Topical BID   pantoprazole  20 mg Oral Daily   sodium bicarbonate  650 mg Oral BID   acetaminophen **OR** acetaminophen, hydrALAZINE, lactulose, loperamide, OLANZapine, ondansetron, polyethylene glycol  Assessment/ Plan:  [name] is a 77 y.o.  female with end stage renal disease on peritoneal dialysis, hypertension, GERD, gout, hyeprlipidemia, diabetes mellitus type II and vertigo who presents to [facility] on 09/17/2023 for Hyponatremia [E87.1] Hypoglycemia [E16.2] Encephalopathy [G93.40] Acute UTI [N39.0] Injury of head, initial encounter [S09.90XA] Fall, initial encounter [W19.XXXA] Altered mental status, unspecified altered mental status type [R41.82]  [facility]/TTS/right chest PermCath  End Stage Renal Disease: Dialysis received yesterday, UF 1L achieved. Next treatment scheduled for Thursday.   Anemia of chronic kidney disease: macrocytic Lab Results  Component Value Date   HGB 8.7 (L) 09/19/2023  Patient receives Mircera outpatient.   3. Hypertension with chronic kidney disease: Currently receiving amlodipine and metoprolol.  Blood pressure acceptable for this patient.  4. Diabetes mellitus  type II with chronic kidney disease: insulin dependent.  Hemoglobin A1c 7.2 on 9//24. -Primary team to manage sliding scale insulin    LOS: 3 Rosela Supak 10/25/202412:18 PM

## 2023-09-20 NOTE — Progress Notes (Signed)
Cathflo instilled to both hemodialysis catheter ports per NP.  D/t poor flow.

## 2023-09-20 NOTE — Progress Notes (Addendum)
Progress Note   Patient: Heather Lopez ZOX:[id number] DOB: 06/17/1946 DOA: 09/17/2023     3 DOS: the patient was seen and examined on 09/20/2023   Brief hospital course:  ANEESHA HILGERS is a 77 y.o. female with medical history significant of ESRD on HD recently switched from PD to HD TTS, HTN, HLD, gout, GERD, IDDM, obesity, brought in by family member for acute confusion.   Husband at bedside gave history.  Patient had first episode of agitation and confusion 2 days ago, patient woke up around 1 AM on Monday, started to have agitation with significant visual and hearing hallucinations, husband thought the patient glucose was low and felt her some orange juice and her symptoms subsided lasted for within 1 hour in the ED husband was able to calm her down.  No events or day yesterday.  This morning around 1 AM again patient woke up with significant screaming and visual hallucination.  Yesterday daytime patient also complained about lower abdominal pain.  But denies any diarrhea or dysuria.  Patient switched to HD last month due to repeated SBP associated with PD.  Last HD was on Saturday.   ED Course: Afebrile, no tachycardia blood pressure 140/50 O2 saturation 95% on room air.  UA showed hematuria with pyuria.  WBC 12.2, sodium 126, potassium 3.5, creatinine 6.5, BUN 40.   CT head positive finding for trace parafalcine subdural hematoma    Assessment and Plan:  Acute metabolic encephalopathy Secondary to UTI and hypoglycemia Patient appears to be back to her baseline mental status Urine culture yields greater than 100,000 colonies of Klebsiella pneumoniae sensitive to cephalosporin and patient is currently on antibiotic therapy Blood sugar this have stabilized        Status post Fall Subdural hematoma CT of the head showed Small parafalcine subdural hematoma less than 3 mm   Husband unsure about patient had fall at home.  Neuroexam nonfocal.   Patient states that she fell at home with a  subdural hematoma appears to be from the same. Neurosurgery reviewed the films and recommend no indication to repeat CT head. PT has evaluated patient and they recommend home health     UTI with hematuria Urine culture yields Klebsiella Pneumoniae, sensitive to cephalosporins Continue ceftriaxone to complete a 7-day course of therapy.     Hyponatremia SIADH  Chronic, baseline 127-131 Urine sodium less than 100, urine osmolality 239 probably     Hypokalemia -PO replacement    IDDM, with hypoglycemia Hold insulin Blood sugars improved on dextrose containing fluids which has since been discontinued Encourage oral intake Check blood sugars AC and HS       HTN -Continue metoprolol and amlodipine.  Yes -Given there is some worsening of mentation, will hold off clonidine.       ESRD on HD TTS -Nephrology consult appreciated For dialysis in a.m.     Chronic sacral pressure ulcer -Appears to be stable, unable to examine due to altered mentations, husband does wound care at home.  Consult wound care team.          Subjective: Patient is seen and examined at the bedside.  Husband is at bedside.  No new complaints  Physical Exam: Vitals:   09/19/23 1338 09/19/23 1410 09/19/23 2226 09/20/23 0805  BP: (!) 158/68 (!) 151/59 (!) 153/66 (!) 162/71  Pulse: 88 90 92 81  Resp: 20 18 18 20   Temp: 98.6 F (37 C) 98.2 F (36.8 C) 98.2 F (36.8 C) 98.3 F (  36.8 C)  TempSrc: Oral Oral  Oral  SpO2: 96% 97% 96% 94%  Weight: 92 kg     Height:       Eyes: PERRL, lids and conjunctivae normal ENMT: Mucous membranes are moist. Posterior pharynx clear of any exudate or lesions.Normal dentition.  Neck: normal, supple, no masses, no thyromegaly Respiratory: clear to auscultation bilaterally, no wheezing, no crackles. Normal respiratory effort. No accessory muscle use.  Cardiovascular: Regular rate and rhythm, no murmurs / rubs / gallops. No extremity edema. 2+ pedal pulses. No  carotid bruits.  Abdomen: Nontender, nondistended no masses palpated. No hepatosplenomegaly. Bowel sounds positive.  Musculoskeletal: no clubbing / cyanosis. No joint deformity upper and lower extremities. Good ROM, no contractures. Normal muscle tone.  Skin: no rashes, lesions, ulcers. No induration Neurologic: No facial droops, following commands. Psychiatric: Awake, oriented to person and place       Data Reviewed:  There are no new results to review at this time.  Family Communication: Plan of care discussed with patient and her husband at the bedside.  They verbalize understanding and agree with the plan.  Plan  Disposition: Status is: Inpatient Remains inpatient appropriate because: On IV antibiotics  Planned Discharge Destination: Home with Home Health    Time spent: 33 minutes  Author: Lucile Shutters, MD 09/20/2023 1:26 PM  For on call review [email or web address].

## 2023-09-21 DIAGNOSIS — G9341 Metabolic encephalopathy: Secondary | ICD-10-CM | POA: Diagnosis not present

## 2023-09-21 LAB — RENAL FUNCTION PANEL
Albumin: 2.6 g/dL — ABNORMAL LOW (ref 3.5–5.0)
Anion gap: 13 (ref 5–15)
BUN: 36 mg/dL — ABNORMAL HIGH (ref 8–23)
CO2: 23 mmol/L (ref 22–32)
Calcium: 8.6 mg/dL — ABNORMAL LOW (ref 8.9–10.3)
Chloride: 92 mmol/L — ABNORMAL LOW (ref 98–111)
Creatinine, Ser: 5.67 mg/dL — ABNORMAL HIGH (ref 0.44–1.00)
GFR, Estimated: 7 mL/min — ABNORMAL LOW (ref >60)
Glucose, Bld: 204 mg/dL — ABNORMAL HIGH (ref 70–99)
Phosphorus: 4.4 mg/dL (ref 2.5–4.6)
Potassium: 3.9 mmol/L (ref 3.5–5.1)
Sodium: 128 mmol/L — ABNORMAL LOW (ref 135–145)

## 2023-09-21 LAB — CBC
HCT: 28.1 % — ABNORMAL LOW (ref 36.0–46.0)
Hemoglobin: 9 g/dL — ABNORMAL LOW (ref 12.0–15.0)
MCH: 33 pg (ref 26.0–34.0)
MCHC: 32 g/dL (ref 30.0–36.0)
MCV: 102.9 fL — ABNORMAL HIGH (ref 80.0–100.0)
Platelets: 404 10*3/uL — ABNORMAL HIGH (ref 150–400)
RBC: 2.73 MIL/uL — ABNORMAL LOW (ref 3.87–5.11)
RDW: 16.6 % — ABNORMAL HIGH (ref 11.5–15.5)
WBC: 12.6 10*3/uL — ABNORMAL HIGH (ref 4.0–10.5)
nRBC: 0 % (ref 0.0–0.2)

## 2023-09-21 LAB — GLUCOSE, CAPILLARY
Glucose-Capillary: 179 mg/dL — ABNORMAL HIGH (ref 70–99)
Glucose-Capillary: 182 mg/dL — ABNORMAL HIGH (ref 70–99)
Glucose-Capillary: 223 mg/dL — ABNORMAL HIGH (ref 70–99)

## 2023-09-21 MED ORDER — INSULIN ASPART 100 UNIT/ML IJ SOLN
0.0000 [IU] | Freq: Three times a day (TID) | INTRAMUSCULAR | Status: DC
  Administered 2023-09-21 (×2): 2 [IU] via SUBCUTANEOUS
  Administered 2023-09-22: 3 [IU] via SUBCUTANEOUS
  Filled 2023-09-21 (×2): qty 1
  Filled 2023-09-21: qty 0.09
  Filled 2023-09-21: qty 1

## 2023-09-21 MED ORDER — INSULIN DETEMIR 100 UNIT/ML ~~LOC~~ SOLN
10.0000 [IU] | Freq: Every day | SUBCUTANEOUS | Status: DC
  Administered 2023-09-21: 10 [IU] via SUBCUTANEOUS
  Filled 2023-09-21 (×2): qty 0.1

## 2023-09-21 NOTE — Plan of Care (Signed)
  Problem: Education: Goal: Knowledge of General Education information will improve Description: Including pain rating scale, medication(s)/side effects and non-pharmacologic comfort measures Outcome: Progressing   Problem: Health Behavior/Discharge Planning: Goal: Ability to manage health-related needs will improve Outcome: Progressing   Problem: Clinical Measurements: Goal: Ability to maintain clinical measurements within normal limits will improve Outcome: Progressing Goal: Diagnostic test results will improve Outcome: Progressing Goal: Cardiovascular complication will be avoided Outcome: Progressing   Problem: Activity: Goal: Risk for activity intolerance will decrease Outcome: Progressing   Problem: Nutrition: Goal: Adequate nutrition will be maintained Outcome: Progressing   Problem: Elimination: Goal: Will not experience complications related to bowel motility Outcome: Progressing Goal: Will not experience complications related to urinary retention Outcome: Progressing

## 2023-09-21 NOTE — Progress Notes (Signed)
Progress Note   Patient: Heather Lopez:[id number] DOB: 12-08-1945 DOA: 09/17/2023     4 DOS: the patient was seen and examined on 09/21/2023   Brief hospital course:  JOYCELIN Lopez is a 77 y.o. female with medical history significant of ESRD on HD recently switched from PD to HD TTS, HTN, HLD, gout, GERD, IDDM, obesity, brought in by family member for acute confusion.   Husband at bedside gave history.  Patient had first episode of agitation and confusion 2 days ago, patient woke up around 1 AM on Monday, started to have agitation with significant visual and hearing hallucinations, husband thought the patient glucose was low and felt her some orange juice and her symptoms subsided lasted for within 1 hour in the ED husband was able to calm her down.  No events or day yesterday.  This morning around 1 AM again patient woke up with significant screaming and visual hallucination.  Yesterday daytime patient also complained about lower abdominal pain.  But denies any diarrhea or dysuria.  Patient switched to HD last month due to repeated SBP associated with PD.  Last HD was on Saturday.   ED Course: Afebrile, no tachycardia blood pressure 140/50 O2 saturation 95% on room air.  UA showed hematuria with pyuria.  WBC 12.2, sodium 126, potassium 3.5, creatinine 6.5, BUN 40.   CT head positive finding for trace parafalcine subdural hematoma      Assessment and Plan:  Acute metabolic encephalopathy Secondary to UTI and hypoglycemia Patient appears to be back to her baseline mental status Urine culture yields greater than 100,000 colonies of Klebsiella pneumoniae sensitive to cephalosporin and patient is currently on antibiotic therapy Blood sugar this have stabilized         Status post Fall Subdural hematoma CT of the head showed Small parafalcine subdural hematoma less than 3 mm   Husband unsure about patient had fall at home.  Neuroexam nonfocal.   Patient states that she fell at home with a  subdural hematoma appears to be from the same. Neurosurgery reviewed the films and recommend no indication to repeat CT head. PT has evaluated patient and they recommend home health     UTI with hematuria Urine culture yields Klebsiella Pneumoniae, sensitive to cephalosporins Continue ceftriaxone to complete a 7-day course of therapy.     Hyponatremia SIADH  Chronic, baseline 127-131 Urine sodium less than 100, urine osmolality 239 probably     Hypokalemia -PO replacement     IDDM, with hypoglycemia Blood sugars have stabilized Patient was on 70 units of Lantus daily, will decrease to 10 units daily and monitor sugars closely Encourage oral intake Check blood sugars AC and HS       HTN -Continue metoprolol and amlodipine.   -Given there is some worsening of mentation, will hold off clonidine.       ESRD on HD TTS -Nephrology consult appreciated For dialysis in a.m.     Chronic sacral pressure ulcer -Appears to be stable, unable to examine due to altered mentations, husband does wound care at home.  Consult wound care team.               Subjective: Patient is seen and examined at the bedside in the dialysis unit.  No new complaints  Physical Exam: Vitals:   09/21/23 1030 09/21/23 1100 09/21/23 1125 09/21/23 1139  BP: (!) 133/51 (!) 130/55 (!) 141/63   Pulse: 86 86 87   Resp: (!) 21 19 (!) 21  Temp:      TempSrc:      SpO2: 96% 94% 96%   Weight:    88.8 kg  Height:       Eyes: PERRL, lids and conjunctivae normal ENMT: Mucous membranes are moist. Posterior pharynx clear of any exudate or lesions.Normal dentition.  Neck: normal, supple, no masses, no thyromegaly Respiratory: clear to auscultation bilaterally, no wheezing, no crackles. Normal respiratory effort. No accessory muscle use.  Cardiovascular: Regular rate and rhythm, no murmurs / rubs / gallops. No extremity edema. 2+ pedal pulses. No carotid bruits.  Abdomen: Nontender, nondistended no  masses palpated. No hepatosplenomegaly. Bowel sounds positive.  Musculoskeletal: no clubbing / cyanosis. No joint deformity upper and lower extremities. Good ROM, no contractures. Normal muscle tone.  Skin: no rashes, lesions, ulcers. No induration Neurologic: No facial droops, following commands. Psychiatric: Awake, oriented to person and place    Data Reviewed: Labs reviewed.  White count 12.6, hemoglobin 9.0 platelet count 4 4, sodium 128 There are no new results to review at this time.  Family Communication: Plan of care discussed with patient in detail at the bedside.  She verbalizes understanding and agrees with the plan.  Disposition: Status is: Inpatient Remains inpatient appropriate because: Optimize glycemic control  Planned Discharge Destination: Home    Time spent: 33 minutes  Author: Lucile Shutters, MD 09/21/2023 1:09 PM  For on call review [email or web address].

## 2023-09-21 NOTE — Plan of Care (Signed)
  Problem: Education: Goal: Ability to describe self-care measures that may prevent or decrease complications (Diabetes Survival Skills Education) will improve Outcome: Progressing Goal: Individualized Educational Video(s) Outcome: Progressing   Problem: Coping: Goal: Ability to adjust to condition or change in health will improve Outcome: Progressing   Problem: Nutritional: Goal: Maintenance of adequate nutrition will improve Outcome: Progressing Goal: Progress toward achieving an optimal weight will improve Outcome: Progressing   Problem: Skin Integrity: Goal: Risk for impaired skin integrity will decrease Outcome: Progressing   Problem: Education: Goal: Knowledge of General Education information will improve Description: Including pain rating scale, medication(s)/side effects and non-pharmacologic comfort measures Outcome: Progressing   Problem: Health Behavior/Discharge Planning: Goal: Ability to manage health-related needs will improve Outcome: Progressing   Problem: Clinical Measurements: Goal: Ability to maintain clinical measurements within normal limits will improve Outcome: Progressing Goal: Will remain free from infection Outcome: Progressing Goal: Diagnostic test results will improve Outcome: Progressing Goal: Respiratory complications will improve Outcome: Progressing Goal: Cardiovascular complication will be avoided Outcome: Progressing   Problem: Activity: Goal: Risk for activity intolerance will decrease Outcome: Progressing

## 2023-09-21 NOTE — Progress Notes (Signed)
[facility]  ROUNDING NOTE   Subjective:   [name] was admitted to [facility] on 09/17/2023 for Hyponatremia [E87.1] Hypoglycemia [E16.2] Encephalopathy [G93.40] Acute UTI [N39.0] Injury of head, initial encounter [S09.90XA] Fall, initial encounter [W19.XXXA] Altered mental status, unspecified altered mental status type [R41.82].  She is known to our practice and receives outpatient dialysis treatments at [facility] on a TTS schedule.  Last treatment completed on Saturday.  Update Patient seen and evaluated during dialysis   HEMODIALYSIS FLOWSHEET:  Blood Flow Rate (mL/min): 349 mL/min Arterial Pressure (mmHg): -206.45 mmHg Venous Pressure (mmHg): 152.52 mmHg TMP (mmHg): -9.49 mmHg Ultrafiltration Rate (mL/min): 0 mL/min Dialysate Flow Rate (mL/min): 300 ml/min Dialysis Fluid Bolus: Normal Saline Bolus Amount (mL): 100 mL  Tolerating treatment well Denies pain Alert and oriented   Objective:  Vital signs in last 24 hours:  Temp:  [97.9 F (36.6 C)-98.4 F (36.9 C)] 98.4 F (36.9 C) (10/26 0738) Pulse Rate:  [77-87] 86 (10/26 1100) Resp:  [14-26] 19 (10/26 1100) BP: (117-160)/(51-71) 130/55 (10/26 1100) SpO2:  [93 %-98 %] 94 % (10/26 1100) Weight:  [89.8 kg] 89.8 kg (10/26 0738)  Weight change:  Filed Weights   09/19/23 0814 09/19/23 1338 09/21/23 0738  Weight: 92.9 kg 92 kg 89.8 kg     Intake/Output: I/O last 3 completed shifts: In: 480 [P.O.:480] Out: -    Intake/Output this shift:  No intake/output data recorded.  Physical Exam: General: NAD  Head: Normocephalic, atraumatic. Moist oral mucosal membranes  Eyes: Anicteric  Lungs:  Clear to auscultation  Heart: Regular rate and rhythm  Abdomen:  Soft, nontender  Extremities:  no peripheral edema.   Neurologic: Alert, moving all four extremities  Skin: No lesions  Access: Rt chest permcath    Basic Metabolic Panel: Recent Labs  Lab 09/17/23 0530 09/18/23 0314 09/19/23 0828  09/21/23 0736  NA 126* 131* 129* 128*  K 3.5 3.6 3.6 3.9  CL 89* 94* 94* 92*  CO2 25 25 24 23   GLUCOSE 165* 69* 127* 204*  BUN 40* 25* 32* 36*  CREATININE 6.56* 4.68* 5.16* 5.67*  CALCIUM 8.5* 8.4* 8.4* 8.6*  MG 2.3  --   --   --   PHOS  --   --  2.5 4.4    Liver Function Tests: Recent Labs  Lab 09/17/23 0530 09/19/23 0828 09/21/23 0736  AST 36  --   --   ALT 33  --   --   ALKPHOS 113  --   --   BILITOT 0.6  --   --   PROT 7.2  --   --   ALBUMIN 2.7* 2.5* 2.6*   Recent Labs  Lab 09/17/23 0530  LIPASE 23   No results for input(s): "AMMONIA" in the last 168 hours.  CBC: Recent Labs  Lab 09/17/23 0530 09/18/23 0314 09/19/23 0828 09/21/23 0736  WBC 12.2* 10.3 10.0 12.6*  NEUTROABS 9.7*  --   --   --   HGB 9.0* 8.9* 8.7* 9.0*  HCT 28.2* 28.0* 27.5* 28.1*  MCV 102.9* 102.9* 104.2* 102.9*  PLT 355 343 382 404*    Cardiac Enzymes: No results for input(s): "CKTOTAL", "CKMB", "CKMBINDEX", "TROPONINI" in the last 168 hours.  BNP: Invalid input(s): "POCBNP"  CBG: Recent Labs  Lab 09/19/23 2224 09/20/23 0806 09/20/23 1201 09/20/23 1534 09/20/23 2156  GLUCAP 184* 184* 177* 183* 204*    Microbiology: Results for orders placed or performed during the hospital encounter of 09/17/23  Urine Culture     Status: Abnormal   Collection Time: 09/17/23  5:50 AM   Specimen: Urine, Clean Catch  Result Value Ref Range Status   Specimen Description   Final    URINE, CLEAN CATCH Performed at [facility], [address] [place], [place] [address]    Special Requests   Final    NONE Performed at [facility], [address] [place], [place] [address]    Culture >=100,000 COLONIES/mL KLEBSIELLA PNEUMONIAE (A)  Final   Report Status 09/20/2023 FINAL  Final   Organism ID, Bacteria KLEBSIELLA PNEUMONIAE (A)  Final      Susceptibility   Klebsiella pneumoniae - MIC*    AMPICILLIN >=32 RESISTANT Resistant     CEFAZOLIN <=4 SENSITIVE Sensitive      CEFEPIME <=0.12 SENSITIVE Sensitive     CEFTRIAXONE <=0.25 SENSITIVE Sensitive     CIPROFLOXACIN <=0.25 SENSITIVE Sensitive     GENTAMICIN <=1 SENSITIVE Sensitive     IMIPENEM <=0.25 SENSITIVE Sensitive     NITROFURANTOIN 64 INTERMEDIATE Intermediate     TRIMETH/SULFA <=20 SENSITIVE Sensitive     AMPICILLIN/SULBACTAM 4 SENSITIVE Sensitive     PIP/TAZO <=4 SENSITIVE Sensitive ug/mL    * >=100,000 COLONIES/mL KLEBSIELLA PNEUMONIAE    Coagulation Studies: No results for input(s): "LABPROT", "INR" in the last 72 hours.  Urinalysis: No results for input(s): "COLORURINE", "LABSPEC", "PHURINE", "GLUCOSEU", "HGBUR", "BILIRUBINUR", "KETONESUR", "PROTEINUR", "UROBILINOGEN", "NITRITE", "LEUKOCYTESUR" in the last 72 hours.  Invalid input(s): "APPERANCEUR"     Imaging: No results found.   Medications:    cefTRIAXone (ROCEPHIN)  IV      amLODipine  5 mg Oral Daily   aspirin EC  81 mg Oral Daily   atorvastatin  20 mg Oral Daily   buPROPion  150 mg Oral Daily   busPIRone  5 mg Oral Daily   Chlorhexidine Gluconate Cloth  6 each Topical Q0600   ferrous sulfate  325 mg Oral Q breakfast   fesoterodine  4 mg Oral Daily   heparin  5,000 Units Subcutaneous Q12H   insulin aspart  0-9 Units Subcutaneous TID WC   insulin detemir  10 Units Subcutaneous Daily   metoprolol tartrate  100 mg Oral BID   nystatin   Topical BID   pantoprazole  20 mg Oral Daily   sodium bicarbonate  650 mg Oral BID   acetaminophen **OR** acetaminophen, hydrALAZINE, lactulose, loperamide, OLANZapine, ondansetron, polyethylene glycol  Assessment/ Plan:  [name] is a 77 y.o.  female with end stage renal disease on peritoneal dialysis, hypertension, GERD, gout, hyeprlipidemia, diabetes mellitus type II and vertigo who presents to [facility] on 09/17/2023 for Hyponatremia [E87.1] Hypoglycemia [E16.2] Encephalopathy [G93.40] Acute UTI [N39.0] Injury of head, initial encounter [S09.90XA] Fall, initial encounter  [W19.XXXA] Altered mental status, unspecified altered mental status type [R41.82]  [facility]/TTS/right chest PermCath  End Stage Renal Disease: Receiving dialysis, UF reduced due to fluid status. Next treatment scheduled for Tuesday.   Anemia of chronic kidney disease: macrocytic Lab Results  Component Value Date   HGB 9.0 (L) 09/21/2023  Patient receives Mircera outpatient. Hgb within desired goal  3. Hypertension with chronic kidney disease: Currently receiving amlodipine and metoprolol.  Blood pressure 141/63 during dialysis  4. Diabetes mellitus type II with chronic kidney disease: insulin dependent.  Hemoglobin A1c 7.2 on 9//24.  - Glucose elevated at times -Primary team to manage sliding scale insulin    LOS: 4 Greely Atiyeh 10/26/202411:25 AM

## 2023-09-21 NOTE — Progress Notes (Signed)
Received patient in bed to unit.    Informed consent signed and in chart.    TX duration: 3.5 hrs     Transported back to floor  Hand-off given to patient's nurse.   Access used:  Rt chest CVC Access issues: N/A Cathflo removed pre tx  Total UF removed: 600 mls Post HD weight: 88.8kg      Maple Hudson, RN Dialysis Unit

## 2023-09-21 NOTE — Progress Notes (Signed)
Mobility Specialist - Progress Note   09/21/23 1006  Mobility  Activity Off unit   Pt off unit during attempt. Will attempt at another date/time.   Terrilyn Saver  Mobility Specialist  09/21/23 10:07 AM

## 2023-09-22 DIAGNOSIS — G9341 Metabolic encephalopathy: Secondary | ICD-10-CM | POA: Diagnosis not present

## 2023-09-22 LAB — GLUCOSE, CAPILLARY
Glucose-Capillary: 210 mg/dL — ABNORMAL HIGH (ref 70–99)
Glucose-Capillary: 270 mg/dL — ABNORMAL HIGH (ref 70–99)

## 2023-09-22 MED ORDER — INSULIN DETEMIR 100 UNIT/ML ~~LOC~~ SOLN
15.0000 [IU] | Freq: Every day | SUBCUTANEOUS | Status: DC
  Administered 2023-09-22: 15 [IU] via SUBCUTANEOUS
  Filled 2023-09-22: qty 0.15

## 2023-09-22 MED ORDER — INSULIN ASPART 100 UNIT/ML IJ SOLN: 3.0000 [IU] | Freq: Three times a day (TID) | INTRAMUSCULAR | 11 refills | Status: DC

## 2023-09-22 MED ORDER — INSULIN GLARGINE 100 UNIT/ML ~~LOC~~ SOLN: 15.0000 [IU] | Freq: Every day | SUBCUTANEOUS | 11 refills | Status: AC

## 2023-09-22 MED ORDER — CEPHALEXIN 500 MG PO CAPS: 500.0000 mg | ORAL_CAPSULE | Freq: Four times a day (QID) | ORAL | 0 refills | Status: AC

## 2023-09-22 NOTE — Progress Notes (Signed)
[facility]  ROUNDING NOTE   Subjective:   [name] was admitted to [facility] on 09/17/2023 for Hyponatremia [E87.1] Hypoglycemia [E16.2] Encephalopathy [G93.40] Acute UTI [N39.0] Injury of head, initial encounter [S09.90XA] Fall, initial encounter [W19.XXXA] Altered mental status, unspecified altered mental status type [R41.82].  She is known to our practice and receives outpatient dialysis treatments at [facility] on a TTS schedule.  Last treatment completed on Saturday.  Update  Patient seen sitting up in bed No family present Appears well Denies pain or discomfort States she feels well enough for discharge.   Objective:  Vital signs in last 24 hours:  Temp:  [98.5 F (36.9 C)-98.6 F (37 C)] 98.5 F (36.9 C) (10/27 0748) Pulse Rate:  [87-95] 87 (10/27 0915) Resp:  [17-18] 18 (10/27 0748) BP: (114-152)/(35-66) 152/66 (10/27 0915) SpO2:  [94 %-95 %] 94 % (10/27 0748)  Weight change:  Filed Weights   09/19/23 1338 09/21/23 0738 09/21/23 1139  Weight: 92 kg 89.8 kg 88.8 kg     Intake/Output: I/O last 3 completed shifts: In: 960 [P.O.:960] Out: 600 [Other:600]   Intake/Output this shift:  Total I/O In: 340.5 [P.O.:240; IV Piggyback:100.5] Out: -   Physical Exam: General: NAD  Head: Normocephalic, atraumatic. Moist oral mucosal membranes  Eyes: Anicteric  Lungs:  Clear to auscultation  Heart: Regular rate and rhythm  Abdomen:  Soft, nontender  Extremities:  no peripheral edema.   Neurologic: Alert, moving all four extremities  Skin: No lesions  Access: Rt chest permcath    Basic Metabolic Panel: Recent Labs  Lab 09/17/23 0530 09/18/23 0314 09/19/23 0828 09/21/23 0736  NA 126* 131* 129* 128*  K 3.5 3.6 3.6 3.9  CL 89* 94* 94* 92*  CO2 25 25 24 23   GLUCOSE 165* 69* 127* 204*  BUN 40* 25* 32* 36*  CREATININE 6.56* 4.68* 5.16* 5.67*  CALCIUM 8.5* 8.4* 8.4* 8.6*  MG 2.3  --   --   --   PHOS  --   --  2.5 4.4    Liver  Function Tests: Recent Labs  Lab 09/17/23 0530 09/19/23 0828 09/21/23 0736  AST 36  --   --   ALT 33  --   --   ALKPHOS 113  --   --   BILITOT 0.6  --   --   PROT 7.2  --   --   ALBUMIN 2.7* 2.5* 2.6*   Recent Labs  Lab 09/17/23 0530  LIPASE 23   No results for input(s): "AMMONIA" in the last 168 hours.  CBC: Recent Labs  Lab 09/17/23 0530 09/18/23 0314 09/19/23 0828 09/21/23 0736  WBC 12.2* 10.3 10.0 12.6*  NEUTROABS 9.7*  --   --   --   HGB 9.0* 8.9* 8.7* 9.0*  HCT 28.2* 28.0* 27.5* 28.1*  MCV 102.9* 102.9* 104.2* 102.9*  PLT 355 343 382 404*    Cardiac Enzymes: No results for input(s): "CKTOTAL", "CKMB", "CKMBINDEX", "TROPONINI" in the last 168 hours.  BNP: Invalid input(s): "POCBNP"  CBG: Recent Labs  Lab 09/21/23 1234 09/21/23 1631 09/21/23 2143 09/22/23 0749 09/22/23 1131  GLUCAP 179* 182* 223* 210* 270*    Microbiology: Results for orders placed or performed during the hospital encounter of 09/17/23  Urine Culture     Status: Abnormal   Collection Time: 09/17/23  5:50 AM   Specimen: Urine, Clean Catch  Result Value Ref Range Status   Specimen Description   Final    URINE, CLEAN  CATCH Performed at [facility], [address], [place], [place] [address]    Special Requests   Final    NONE Performed at [facility], [address] [place], [place] [address]    Culture >=100,000 COLONIES/mL KLEBSIELLA PNEUMONIAE (A)  Final   Report Status 09/20/2023 FINAL  Final   Organism ID, Bacteria KLEBSIELLA PNEUMONIAE (A)  Final      Susceptibility   Klebsiella pneumoniae - MIC*    AMPICILLIN >=32 RESISTANT Resistant     CEFAZOLIN <=4 SENSITIVE Sensitive     CEFEPIME <=0.12 SENSITIVE Sensitive     CEFTRIAXONE <=0.25 SENSITIVE Sensitive     CIPROFLOXACIN <=0.25 SENSITIVE Sensitive     GENTAMICIN <=1 SENSITIVE Sensitive     IMIPENEM <=0.25 SENSITIVE Sensitive     NITROFURANTOIN 64 INTERMEDIATE Intermediate     TRIMETH/SULFA  <=20 SENSITIVE Sensitive     AMPICILLIN/SULBACTAM 4 SENSITIVE Sensitive     PIP/TAZO <=4 SENSITIVE Sensitive ug/mL    * >=100,000 COLONIES/mL KLEBSIELLA PNEUMONIAE    Coagulation Studies: No results for input(s): "LABPROT", "INR" in the last 72 hours.  Urinalysis: No results for input(s): "COLORURINE", "LABSPEC", "PHURINE", "GLUCOSEU", "HGBUR", "BILIRUBINUR", "KETONESUR", "PROTEINUR", "UROBILINOGEN", "NITRITE", "LEUKOCYTESUR" in the last 72 hours.  Invalid input(s): "APPERANCEUR"     Imaging: No results found.   Medications:    cefTRIAXone (ROCEPHIN)  IV Stopped (09/22/23 1004)    amLODipine  5 mg Oral Daily   aspirin EC  81 mg Oral Daily   atorvastatin  20 mg Oral Daily   buPROPion  150 mg Oral Daily   busPIRone  5 mg Oral Daily   Chlorhexidine Gluconate Cloth  6 each Topical Q0600   ferrous sulfate  325 mg Oral Q breakfast   fesoterodine  4 mg Oral Daily   heparin  5,000 Units Subcutaneous Q12H   insulin aspart  0-9 Units Subcutaneous TID WC   insulin detemir  15 Units Subcutaneous Daily   metoprolol tartrate  100 mg Oral BID   nystatin   Topical BID   pantoprazole  20 mg Oral Daily   sodium bicarbonate  650 mg Oral BID   acetaminophen **OR** acetaminophen, hydrALAZINE, lactulose, loperamide, OLANZapine, ondansetron, polyethylene glycol  Assessment/ Plan:  [name] is a 77 y.o.  female with end stage renal disease on peritoneal dialysis, hypertension, GERD, gout, hyeprlipidemia, diabetes mellitus type II and vertigo who presents to [facility] on [facility]17[facility]2023 for Hyponatremia [E87.1] Hypoglycemia [E16.2] Encephalopathy [G93.40] Acute UTI [N39.0] Injury of head, initial encounter [S09.90XA] Fall, initial encounter [W19.XXXA] Altered mental status, unspecified altered mental status type [R41.82]  [facility]/TTS/right chest PermCath  End Stage Renal Disease: Dialysis received yesterday, UF 600 mL achieved.  Next treatment scheduled for Tuesday.   Anemia of  chronic kidney disease: macrocytic Lab Results  Component Value Date   HGB 9.0 (L) 09/21/2023  Patient receives Mircera outpatient.  Hemoglobin borderline.  Will continue to monitor for need of ESA's during admission.  3. Hypertension with chronic kidney disease: Currently receiving amlodipine and metoprolol.  Blood pressure acceptable for this patient.  4. Diabetes mellitus type II with chronic kidney disease: insulin dependent.  Hemoglobin A1c 7.2 on 9//24.  - Glucose elevated -Primary team to manage sliding scale insulin    LOS: 5 Shiheem Corporan 10/27/202412:11 PM

## 2023-09-22 NOTE — Plan of Care (Signed)
  Problem: Education: Goal: Knowledge of General Education information will improve Description: Including pain rating scale, medication(s)/side effects and non-pharmacologic comfort measures Outcome: Progressing   Problem: Health Behavior/Discharge Planning: Goal: Ability to manage health-related needs will improve Outcome: Progressing   Problem: Clinical Measurements: Goal: Diagnostic test results will improve Outcome: Progressing Goal: Cardiovascular complication will be avoided Outcome: Progressing   Problem: Activity: Goal: Risk for activity intolerance will decrease Outcome: Progressing   Problem: Nutrition: Goal: Adequate nutrition will be maintained Outcome: Progressing   Problem: Elimination: Goal: Will not experience complications related to bowel motility Outcome: Progressing Goal: Will not experience complications related to urinary retention Outcome: Progressing   Problem: Pain Management: Goal: General experience of comfort will improve Outcome: Progressing

## 2023-09-22 NOTE — Plan of Care (Signed)

## 2023-09-22 NOTE — Progress Notes (Signed)
Approximately 1240--Pt discharged to home. At time of discharge, pt VSS with no S/S of discomfort. AVS discharge instructions provided by Wille Celeste, RN. All PIVs removed with sites WDL. All patient belongings taken with pt--pt verified.

## 2023-09-22 NOTE — Discharge Summary (Addendum)
Physician Discharge Summary   Patient: Heather Lopez MRN: [id number] DOB: May 24, 1946  Admit date:     09/17/2023  Discharge date: 09/22/23  Discharge Physician: Letti Towell   PCP: Carren Rang, PA-C   Recommendations at discharge:   Ambulate with rolling walker to reduce risk for falls Complete antibiotic therapy Check and document blood sugars  Discharge Diagnoses: Principal Problem:   Acute metabolic encephalopathy Active Problems:   UTI (urinary tract infection)   Subdural hematoma (HCC)   ESRD on peritoneal dialysis (HCC)   T2DM (type 2 diabetes mellitus) (HCC)   Essential hypertension   Obesity (BMI 30-39.9)   Hypokalemia  Resolved Problems:   * No resolved hospital problems. [facility] Course:  Heather Lopez is a 77 y.o. female with medical history significant of ESRD on HD recently switched from PD to HD TTS, HTN, HLD, gout, GERD, IDDM, obesity, brought in by family member for acute confusion.   Husband at bedside gave history.  Patient had first episode of agitation and confusion 2 days ago, patient woke up around 1 AM on Monday, started to have agitation with significant visual and hearing hallucinations, husband thought the patient glucose was low and felt her some orange juice and her symptoms subsided lasted for within 1 hour in the ED husband was able to calm her down.  No events or day yesterday.  This morning around 1 AM again patient woke up with significant screaming and visual hallucination.  Yesterday daytime patient also complained about lower abdominal pain.  But denies any diarrhea or dysuria.  Patient switched to HD last month due to repeated SBP associated with PD.  Last HD was on Saturday.   ED Course: Afebrile, no tachycardia blood pressure 140/50 O2 saturation 95% on room air.  UA showed hematuria with pyuria.  WBC 12.2, sodium 126, potassium 3.5, creatinine 6.5, BUN 40.   CT head positive finding for trace parafalcine subdural  hematoma    Assessment and Plan:  Acute metabolic encephalopathy Secondary to UTI and hypoglycemia Patient is back to her baseline mental status Urine culture yields greater than 100,000 colonies of Klebsiella pneumoniae sensitive to cephalosporin and patient was treated with IV Rocephin and will be discharged home on Keflex Blood sugar  have stabilized         Status post Fall Subdural hematoma CT of the head showed Small parafalcine subdural hematoma less than 3 mm   Husband unsure about patient had fall at home.  Neuroexam nonfocal.   Patient states that she fell at home with a subdural hematoma appears to be from the same. Neurosurgery reviewed the films and recommend no indication to repeat CT head. PT has evaluated patient and they recommend home health PT Follow-up in neurosurgery as an outpatient      UTI with hematuria Urine culture yields Klebsiella Pneumoniae, sensitive to cephalosporins Continue ceftriaxone/Keflex to complete a 7-day course of therapy.     Hyponatremia SIADH  Chronic, baseline 127-131 Urine sodium less than 100, urine osmolality 239 probably     Hypokalemia -PO replacement     IDDM, with hypoglycemia Blood sugars have stabilized Patient was on 70 units of Lantus daily, will decrease to 15 units daily as well as Novolog 3 units ac meals  and monitor sugars closely Patient advised to keep a log of her blood sugars on discharge and take to her primary care provider. Check blood sugars AC and HS       HTN -Continue metoprolol and  Clonidine    ESRD on HD TTS -Nephrology consult appreciated - Continue outpatient dialysis     Chronic sacral pressure ulcer -Appears to be stable                Consultants: Nephrology, neurosurgery Procedures performed: Renal replacement therapy Disposition: Home Health Diet recommendation:  Discharge Diet Orders (From admission, onward)     Start     Ordered   09/22/23 0000  Diet - low  sodium heart healthy        09/22/23 1021   09/22/23 0000  Diet Carb Modified        09/22/23 1021           Cardiac diet DISCHARGE MEDICATION: Allergies as of 09/22/2023       Reactions   Contrast Media  [iodinated Contrast Media] Anaphylaxis   Iodine Swelling   (IV only) - angioedema        Medication List     STOP taking these medications    lansoprazole 30 MG capsule Commonly known as: PREVACID       TAKE these medications    aspirin EC 81 MG tablet Take 81 mg by mouth daily.   atorvastatin 20 MG tablet Commonly known as: LIPITOR Take 20 mg by mouth daily.   buPROPion 150 MG 24 hr tablet Commonly known as: WELLBUTRIN XL Take 150 mg by mouth daily.   busPIRone 5 MG tablet Commonly known as: BUSPAR Take 5 mg by mouth daily.   cephALEXin 500 MG capsule Commonly known as: KEFLEX Take 1 capsule (500 mg total) by mouth 4 (four) times daily for 2 days.   cloNIDine 0.1 MG tablet Commonly known as: CATAPRES Take 1 tablet (0.1 mg total) by mouth 2 (two) times daily.   ferrous sulfate 325 (65 FE) MG tablet Take 325 mg by mouth daily with breakfast.   insulin aspart 100 UNIT/ML injection Commonly known as: novoLOG Inject 3 Units into the skin 3 (three) times daily before meals. What changed: how much to take   insulin glargine 100 UNIT/ML injection Commonly known as: LANTUS Inject 0.15 mLs (15 Units total) into the skin at bedtime. What changed: how much to take   lactulose 10 GM/15ML solution Commonly known as: CHRONULAC Take 45 mLs (30 g total) by mouth 2 (two) times daily as needed for mild constipation or moderate constipation.   loperamide 2 MG tablet Commonly known as: IMODIUM A-D Take 2 mg by mouth every 6 (six) hours as needed for diarrhea or loose stools.   metoprolol tartrate 100 MG tablet Commonly known as: LOPRESSOR Take 1 tablet (100 mg total) by mouth 2 (two) times daily.   multivitamin with minerals Tabs tablet Take 1 tablet  by mouth daily.   ondansetron 4 MG tablet Commonly known as: ZOFRAN Take 4 mg by mouth every 8 (eight) hours as needed.   polyethylene glycol 17 g packet Commonly known as: MIRALAX / GLYCOLAX Take 17 g by mouth daily as needed for mild constipation or moderate constipation.   senna-docusate 8.6-50 MG tablet Commonly known as: Senokot-S Take 1 tablet by mouth at bedtime as needed for mild constipation or moderate constipation.   sodium bicarbonate 650 MG tablet Take 650 mg by mouth 2 (two) times daily.   trospium 20 MG tablet Commonly known as: SANCTURA Take 1 tablet (20 mg total) by mouth 2 (two) times daily.   Veltassa 8.4 g packet Generic drug: patiromer Take 8.4 g by mouth daily. MIX 1 PACKET  IN 1/3 CUP OF WATER AND DRINK BY MOUTH DAILY.        Discharge Exam: Filed Weights   09/19/23 1338 09/21/23 0738 09/21/23 1139  Weight: 92 kg 89.8 kg 88.8 kg    Eyes: PERRL, lids and conjunctivae normal ENMT: Mucous membranes are moist. Posterior pharynx clear of any exudate or lesions.Normal dentition.  Neck: normal, supple, no masses, no thyromegaly Respiratory: clear to auscultation bilaterally, no wheezing, no crackles. Normal respiratory effort. No accessory muscle use.  Cardiovascular: Regular rate and rhythm, no murmurs / rubs / gallops. No extremity edema. 2+ pedal pulses. No carotid bruits.  Abdomen: Nontender, nondistended no masses palpated. No hepatosplenomegaly. Bowel sounds positive.  Musculoskeletal: no clubbing / cyanosis. No joint deformity upper and lower extremities. Good ROM, no contractures. Normal muscle tone.  Skin: no rashes, lesions, ulcers. No induration Neurologic: No facial droops, following commands. Psychiatric: Awake, oriented to person and place    Condition at discharge: stable  The results of significant diagnostics from this hospitalization (including imaging, microbiology, ancillary and laboratory) are listed below for reference.    Imaging Studies: [place] ASCITES (ABDOMEN LIMITED)  Result Date: 09/17/2023 CLINICAL DATA:  Ascites search EXAM: LIMITED ABDOMEN ULTRASOUND FOR ASCITES TECHNIQUE: Limited ultrasound survey for ascites was performed in all four abdominal quadrants. COMPARISON:  None Available. FINDINGS: No abdominal ascites. Partially visualized abdominal structures are unremarkable. IMPRESSION: No evidence of abdominal ascites. Electronically Signed   By: Allegra Lai M.D.   On: 09/17/2023 11:48   CT ABDOMEN PELVIS WO CONTRAST  Result Date: 09/17/2023 CLINICAL DATA:  Altered mental status.  Pain EXAM: CT ABDOMEN AND PELVIS WITHOUT CONTRAST TECHNIQUE: Multidetector CT imaging of the abdomen and pelvis was performed following the standard protocol without IV contrast. RADIATION DOSE REDUCTION: This exam was performed according to the departmental dose-optimization program which includes automated exposure control, adjustment of the mA and/or kV according to patient size and/or use of iterative reconstruction technique. COMPARISON:  CT 07/30/2023 and older FINDINGS: Lower chest: There is some linear opacity lung bases likely scar or atelectasis. No pleural effusion. Trace pericardial effusion. Hepatobiliary: On this non IV contrast exam, the liver parenchyma is grossly preserved. Gallbladder is nondilated. Pancreas: Unremarkable. No pancreatic ductal dilatation or surrounding inflammatory changes. Spleen: Lobular spleen.  Stable. Adrenals/Urinary Tract: Adrenal glands are preserved. Both kidneys show moderate renal collecting system dilatation, similar to previous. There is air within the left renal collecting system. Moderate bilateral perinephric stranding. There is also a right-sided ureteral stent with pigtail in the anterior right mid kidney collecting system and distal in the bladder. Stable caliber of the right ureter. Mild wall thickening of the urinary bladder with slight stranding. Please correlate with clinical  history. Stomach/Bowel: On this non oral contrast exam, the large bowel has a normal course and caliber. Moderate right-sided colonic stool. Slightly redundant course of the transverse colon. Normal appendix in the right hemipelvis posterior to the cecum. Few left-sided colonic diverticula. The stomach and small bowel are nondilated. Vascular/Lymphatic: Normal caliber aorta and IVC with scattered vascular calcifications. There are some small nodes identified along retroperitoneum. These are unchanged from previous. No new lymph node enlargement. Reproductive: Multiple calcified uterine fibroids. No separate adnexal mass. Other: No free intra-air identified. Trace areas of fluid but decreased from the prior CT scan. Musculoskeletal: Scattered degenerative changes of the spine and pelvis, moderate. Multilevel canal stenosis along the lumbar spine. Degenerative changes about the hips are particularly advanced. IMPRESSION: Stable moderate bilateral renal collecting system dilatation. Indwelling  right-sided ureteral stent. There is some air in the bladder and left renal collecting system. Please correlate with any recent instrumentation. Bladder wall is also slightly thickened with some adjacent stranding. Previous peritoneal dialysis catheter no longer identified. Decreasing ascites. Nonspecific bowel gas pattern with moderate colonic stool. Normal appendix. Electronically Signed   By: Karen Kays M.D.   On: 09/17/2023 10:24   CT HEAD WO CONTRAST ( )  Addendum Date: 09/17/2023   ADDENDUM REPORT: 09/17/2023 08:35 ADDENDUM: Study discussed by telephone with Dr. Gwendolyn Grant on 09/17/2023 at 0805 hours. Electronically Signed   By: Odessa Fleming M.D.   On: 09/17/2023 08:35   Result Date: 09/17/2023 CLINICAL DATA:  77 year old female with hypoglycemia, altered mental status. EXAM: CT HEAD WITHOUT CONTRAST TECHNIQUE: Contiguous axial images were obtained from the base of the skull through the vertex without intravenous  contrast. RADIATION DOSE REDUCTION: This exam was performed according to the departmental dose-optimization program which includes automated exposure control, adjustment of the mA and/or kV according to patient size and/or use of iterative reconstruction technique. COMPARISON:  Brain MRI andHead CT 01/27/2014. FINDINGS: Brain: Small or trace para falcine subdural blood, 3 mm in thickness (series 3, image 26). No intraventricular hemorrhage. No other acute intracranial hemorrhage identified. No midline shift or intracranial mass effect. No ventriculomegaly. Some generalized cerebral volume loss since 2015. No cortically based acute infarct identified. Mild for age patchy white matter hypodensity. Vascular: No suspicious intracranial vascular hyperdensity. Calcified atherosclerosis at the skull base. Skull: No fracture identified. Sinuses/Orbits: Visualized paranasal sinuses and mastoids are stable and well aerated. Other: Postoperative changes to both globes since 2015. Some calcified scalp vessel atherosclerosis. Right posterior convexity broad-based scalp soft tissue contusion and/or mild hematoma (series 2, image 72). Underlying calvarium appears intact. IMPRESSION: 1. Positive for trace parafalcine Subdural hematoma, 3 mm in thickness. No intracranial mass effect. 2. No other acute intracranial hemorrhage or acute intracranial abnormality identified. 3. Evidence of right posterior scalp soft tissue injury. No skull fracture identified. Electronically Signed: By: Odessa Fleming M.D. On: 09/17/2023 07:59   CT Cervical Spine Wo Contrast  Result Date: 09/17/2023 CLINICAL DATA:  77 year old female with hypoglycemia, altered mental status. EXAM: CT CERVICAL SPINE WITHOUT CONTRAST TECHNIQUE: Multidetector CT imaging of the cervical spine was performed without intravenous contrast. Multiplanar CT image reconstructions were also generated. RADIATION DOSE REDUCTION: This exam was performed according to the departmental  dose-optimization program which includes automated exposure control, adjustment of the mA and/or kV according to patient size and/or use of iterative reconstruction technique. COMPARISON:  Head CT today. FINDINGS: Alignment: Straightening and mild reversal of cervical lordosis. Multilevel mild degenerative appearing upper cervical spondylolisthesis. Cervicothoracic junction alignment is within normal limits. Bilateral posterior element alignment is within normal limits. Skull base and vertebrae: Visualized skull base is intact. No atlanto-occipital dissociation. C1 and C2 appear chronically degenerated but intact and aligned. No acute osseous abnormality identified. Soft tissues and spinal canal: No prevertebral fluid or swelling. No visible canal hematoma. Partially retropharyngeal course of both carotids with calcified atherosclerosis. Right IJ approach dual lumen dialysis type catheter. Disc levels: Widespread advanced cervical spine degeneration, including upper cervical facet arthropathy superimposed on mild multilevel spondylolisthesis. Degenerative appearing ankylosis at both C6-C7, and at the left C7-T1 facets. Probable mild multilevel degenerative cervical spinal stenosis. Upper chest: Mild respiratory motion, otherwise negative. IMPRESSION: 1. No acute traumatic injury identified in the cervical spine. 2. Widespread advanced cervical spine degeneration. Electronically Signed   By: Althea Grimmer.D.  On: 09/17/2023 08:03    Microbiology: Results for orders placed or performed during the hospital encounter of 09/17/23  Urine Culture     Status: Abnormal   Collection Time: 09/17/23  5:50 AM   Specimen: Urine, Clean Catch  Result Value Ref Range Status   Specimen Description   Final    URINE, CLEAN CATCH Performed at [facility], [address] [place], [place] [address]    Special Requests   Final    NONE Performed at [facility], [address] [place], [place] [address]     Culture >=100,000 COLONIES/mL KLEBSIELLA PNEUMONIAE (A)  Final   Report Status 09/20/2023 FINAL  Final   Organism ID, Bacteria KLEBSIELLA PNEUMONIAE (A)  Final      Susceptibility   Klebsiella pneumoniae - MIC*    AMPICILLIN >=32 RESISTANT Resistant     CEFAZOLIN <=4 SENSITIVE Sensitive     CEFEPIME <=0.12 SENSITIVE Sensitive     CEFTRIAXONE <=0.25 SENSITIVE Sensitive     CIPROFLOXACIN <=0.25 SENSITIVE Sensitive     GENTAMICIN <=1 SENSITIVE Sensitive     IMIPENEM <=0.25 SENSITIVE Sensitive     NITROFURANTOIN 64 INTERMEDIATE Intermediate     TRIMETH/SULFA <=20 SENSITIVE Sensitive     AMPICILLIN/SULBACTAM 4 SENSITIVE Sensitive     PIP/TAZO <=4 SENSITIVE Sensitive ug/mL    * >=100,000 COLONIES/mL KLEBSIELLA PNEUMONIAE    Labs: CBC: Recent Labs  Lab 09/17/23 0530 09/18/23 0314 09/19/23 0828 09/21/23 0736  WBC 12.2* 10.3 10.0 12.6*  NEUTROABS 9.7*  --   --   --   HGB 9.0* 8.9* 8.7* 9.0*  HCT 28.2* 28.0* 27.5* 28.1*  MCV 102.9* 102.9* 104.2* 102.9*  PLT 355 343 382 404*   Basic Metabolic Panel: Recent Labs  Lab 09/17/23 0530 09/18/23 0314 09/19/23 0828 09/21/23 0736  NA 126* 131* 129* 128*  K 3.5 3.6 3.6 3.9  CL 89* 94* 94* 92*  CO2 25 25 24 23   GLUCOSE 165* 69* 127* 204*  BUN 40* 25* 32* 36*  CREATININE 6.56* 4.68* 5.16* 5.67*  CALCIUM 8.5* 8.4* 8.4* 8.6*  MG 2.3  --   --   --   PHOS  --   --  2.5 4.4   Liver Function Tests: Recent Labs  Lab 09/17/23 0530 09/19/23 0828 09/21/23 0736  AST 36  --   --   ALT 33  --   --   ALKPHOS 113  --   --   BILITOT 0.6  --   --   PROT 7.2  --   --   ALBUMIN 2.7* 2.5* 2.6*   CBG: Recent Labs  Lab 09/20/23 2156 09/21/23 1234 09/21/23 1631 09/21/23 2143 09/22/23 0749  GLUCAP 204* 179* 182* 223* 210*    Discharge time spent: greater than 30 minutes.  Signed: Lucile Shutters, MD [facility] 09/22/2023

## 2023-11-04 NOTE — Progress Notes (Unsigned)
11/06/2023 11:51 AM   Heather Lopez [birth year] [id number]  Referring provider: Carren Rang, PA-C [address] [place],  [place] [address]  Urological history: 1. Nephrolithiasis -bilateral URS (11/2021)   2. Bilateral vesicoureteral reflux -cystogram (02/2022) - left>right reflux   3. Right hydronephrosis -managed with stent - last exchange (12/2022)  4. OAB -contributing factors of age, GSM, chronic ureteral stent, anxiety, depression, HTN, hx of smoking and diabetes -trospium IR 20 mg qhs  No chief complaint on file.  HPI: Heather Lopez is a 77 y.o. female who presents today for three month follow up with her husband, [name].   Previous records reviewed.  She was hospitalized at the end of October for acute metabolic encephalopathy, UTI and a subdural hematoma secondary to fall.    Non contrast CT (08/2023) -noted moderate bilateral renal collecting system dilation with an indwelling right ureteral stent.    The stent was last exchanged back in February 2024.  UA ***   PMH: Past Medical History:  Diagnosis Date   Acute renal failure (HCC) 06/27/2021   Anxiety    Aortic atherosclerosis (HCC)    Cataract    Depression    Dysrhythmia    GERD (gastroesophageal reflux disease)    Gout    History of kidney stones    Hyperlipidemia    Hypertension    Nausea & vomiting 12/02/2021   Obesity    Obstruction of left ureteropelvic junction (UPJ) due to stone 06/2021   Osteoarthritis    Perforated duodenal ulcer (HCC) 06/27/2021   Pyonephrosis 06/2021   left   Septic shock (HCC) 06/27/2021   Type 2 diabetes mellitus with stage 5 chronic kidney disease (HCC)    Vertigo     Surgical History: Past Surgical History:  Procedure Laterality Date   CAPD REMOVAL N/A 08/02/2023   Procedure: CONTINUOUS AMBULATORY PERITONEAL DIALYSIS  (CAPD) CATHETER REMOVAL;  Surgeon: Leafy Ro, MD;  Location: [facility];  Service: General;  Laterality: N/A;    CATARACT EXTRACTION, BILATERAL Bilateral 2019   COLONOSCOPY WITH PROPOFOL N/A 08/19/2015   Procedure: COLONOSCOPY WITH PROPOFOL;  Surgeon: Midge Minium, MD;  Location: [facility];  Service: Endoscopy;  Laterality: N/A;  diabetic - insulin   CYSTOSCOPY W/ RETROGRADES Bilateral 12/22/2019   Procedure: CYSTOSCOPY WITH RETROGRADE PYELOGRAM;  Surgeon: Riki Altes, MD;  Location: [facility];  Service: Urology;  Laterality: Bilateral;   CYSTOSCOPY W/ URETERAL STENT PLACEMENT Bilateral 07/05/2021   Procedure: CYSTOSCOPY WITH RETROGRADE PYELOGRAM/BILATERAL URETERAL STENT PLACEMENT;  Surgeon: Riki Altes, MD;  Location: [facility];  Service: Urology;  Laterality: Bilateral;   CYSTOSCOPY W/ URETERAL STENT PLACEMENT Bilateral 12/26/2021   Procedure: CYSTOSCOPY WITH STENT REMOVAL;  Surgeon: Riki Altes, MD;  Location: [facility];  Service: Urology;  Laterality: Bilateral;   CYSTOSCOPY W/ URETERAL STENT PLACEMENT Right 03/13/2022   Procedure: CYSTOSCOPY WITH RETROGRADE PYELOGRAM/URETERAL STENT PLACEMENT;  Surgeon: Riki Altes, MD;  Location: [facility];  Service: Urology;  Laterality: Right;   CYSTOSCOPY W/ URETERAL STENT PLACEMENT Right 01/01/2023   Procedure: CYSTOSCOPY WITH STENT EXCHANGE;  Surgeon: Riki Altes, MD;  Location: [facility];  Service: Urology;  Laterality: Right;   CYSTOSCOPY W/ URETERAL STENT REMOVAL Right 05/08/2022   Procedure: CYSTOSCOPY WITH STENT REMOVAL;  Surgeon: Riki Altes, MD;  Location: [facility];  Service: Urology;  Laterality: Right;   CYSTOSCOPY WITH BIOPSY N/A 12/22/2019   Procedure: CYSTOSCOPY WITH bladder BIOPSY;  Surgeon: Riki Altes,  MD;  Location: [facility];  Service: Urology;  Laterality: N/A;   CYSTOSCOPY WITH BIOPSY  03/13/2022   Procedure: CYSTOSCOPY WITH BLADDER BIOPSY;  Surgeon: Riki Altes, MD;  Location: [facility];  Service: Urology;;   CYSTOSCOPY WITH STENT PLACEMENT Right 12/22/2019   Procedure: CYSTOSCOPY WITH STENT PLACEMENT;  Surgeon:  Riki Altes, MD;  Location: [facility];  Service: Urology;  Laterality: Right;   CYSTOSCOPY WITH STENT PLACEMENT Right 07/03/2022   Procedure: CYSTOSCOPY/RIGHT URETEROSCOPY WITH STENT PLACEMENT;  Surgeon: Riki Altes, MD;  Location: [facility];  Service: Urology;  Laterality: Right;   CYSTOSCOPY WITH URETEROSCOPY Bilateral 12/22/2019   Procedure: CYSTOSCOPY WITH URETEROSCOPY;  Surgeon: Riki Altes, MD;  Location: [facility];  Service: Urology;  Laterality: Bilateral;   CYSTOSCOPY/URETEROSCOPY/HOLMIUM LASER/STENT PLACEMENT Right 12/22/2019   Procedure: CYSTOSCOPY/URETEROSCOPY/HOLMIUM LASER/STENT PLACEMENT;  Surgeon: Riki Altes, MD;  Location: [facility];  Service: Urology;  Laterality: Right;   CYSTOSCOPY/URETEROSCOPY/HOLMIUM LASER/STENT PLACEMENT Bilateral 12/08/2021   Procedure: CYSTOSCOPY/URETEROSCOPY/HOLMIUM LASER/STENT PLACEMENT;  Surgeon: Sondra Come, MD;  Location: [facility];  Service: Urology;  Laterality: Bilateral;   DIALYSIS/PERMA CATHETER INSERTION N/A 06/29/2021   Procedure: DIALYSIS/PERMA CATHETER INSERTION;  Surgeon: Annice Needy, MD;  Location: [facility];  Service: Cardiovascular;  Laterality: N/A;   DIALYSIS/PERMA CATHETER INSERTION N/A 07/13/2021   Procedure: DIALYSIS/PERMA CATHETER INSERTION;  Surgeon: Annice Needy, MD;  Location: [facility];  Service: Cardiovascular;  Laterality: N/A;   DIALYSIS/PERMA CATHETER INSERTION N/A 08/01/2023   Procedure: DIALYSIS/PERMA CATHETER INSERTION;  Surgeon: Annice Needy, MD;  Location: [facility];  Service: Cardiovascular;  Laterality: N/A;   EYE SURGERY     IR GASTROSTOMY TUBE MOD SED  08/01/2021   LAPAROTOMY N/A 06/29/2021   Procedure: EXPLORATORY LAPAROTOMY WITH REPAIR OF DUODENAL PERFORATION;  Surgeon: Henrene Dodge, MD;  Location: [facility];  Service: General;  Laterality: N/A;   POLYPECTOMY  08/19/2015   Procedure: POLYPECTOMY;  Surgeon: Midge Minium, MD;  Location: [facility];  Service:  Endoscopy;;   TUBAL LIGATION  1992    Home Medications:  Allergies as of 11/06/2023       Reactions   Contrast Media  [iodinated Contrast Media] Anaphylaxis   Iodine Swelling   (IV only) - angioedema        Medication List        Accurate as of November 04, 2023 11:51 AM. If you have any questions, ask your nurse or doctor.          aspirin EC 81 MG tablet Take 81 mg by mouth daily.   atorvastatin 20 MG tablet Commonly known as: LIPITOR Take 20 mg by mouth daily.   buPROPion 150 MG 24 hr tablet Commonly known as: WELLBUTRIN XL Take 150 mg by mouth daily.   busPIRone 5 MG tablet Commonly known as: BUSPAR Take 5 mg by mouth daily.   cloNIDine 0.1 MG tablet Commonly known as: CATAPRES Take 1 tablet (0.1 mg total) by mouth 2 (two) times daily.   ferrous sulfate 325 (65 FE) MG tablet Take 325 mg by mouth daily with breakfast.   insulin aspart 100 UNIT/ML injection Commonly known as: novoLOG Inject 3 Units into the skin 3 (three) times daily before meals.   insulin glargine 100 UNIT/ML injection Commonly known as: LANTUS Inject 0.15 mLs (15 Units total) into the skin at bedtime.   lactulose 10 GM/15ML solution Commonly known as: CHRONULAC Take 45 mLs (30 g total) by mouth 2 (  two) times daily as needed for mild constipation or moderate constipation.   loperamide 2 MG tablet Commonly known as: IMODIUM A-D Take 2 mg by mouth every 6 (six) hours as needed for diarrhea or loose stools.   metoprolol tartrate 100 MG tablet Commonly known as: LOPRESSOR Take 1 tablet (100 mg total) by mouth 2 (two) times daily.   multivitamin with minerals Tabs tablet Take 1 tablet by mouth daily.   ondansetron 4 MG tablet Commonly known as: ZOFRAN Take 4 mg by mouth every 8 (eight) hours as needed.   polyethylene glycol 17 g packet Commonly known as: MIRALAX / GLYCOLAX Take 17 g by mouth daily as needed for mild constipation or moderate constipation.   senna-docusate  8.6-50 MG tablet Commonly known as: Senokot-S Take 1 tablet by mouth at bedtime as needed for mild constipation or moderate constipation.   sodium bicarbonate 650 MG tablet Take 650 mg by mouth 2 (two) times daily.   trospium 20 MG tablet Commonly known as: SANCTURA Take 1 tablet (20 mg total) by mouth 2 (two) times daily.   Veltassa 8.4 g packet Generic drug: patiromer Take 8.4 g by mouth daily. MIX 1 PACKET IN 1/3 CUP OF WATER AND DRINK BY MOUTH DAILY.        Allergies:  Allergies  Allergen Reactions   Contrast Media  [Iodinated Contrast Media] Anaphylaxis   Iodine Swelling    (IV only) - angioedema    Family History: Family History  Problem Relation Age of Onset   Diabetes Father    Cancer Father        lung cancer   Diabetes Brother    Stroke Mother     Social History:  reports that she quit smoking about 51 years ago. Her smoking use included cigarettes. She started smoking about 57 years ago. She has a 4.5 pack-year smoking history. She has never used smokeless tobacco. She reports that she does not drink alcohol and does not use drugs.  ROS: Pertinent ROS in HPI  Physical Exam: There were no vitals taken for this visit.  Constitutional:  Well nourished. Alert and oriented, No acute distress. HEENT: [place] AT, moist mucus membranes.  Trachea midline, no masses. Cardiovascular: No clubbing, cyanosis, or edema. Respiratory: Normal respiratory effort, no increased work of breathing. GU: No CVA tenderness.  No bladder fullness or masses.  Recession of labia minora, dry, pale vulvar vaginal mucosa and loss of mucosal ridges and folds.  Normal urethral meatus, no lesions, no prolapse, no discharge.   No urethral masses, tenderness and/or tenderness. No bladder fullness, tenderness or masses. *** vagina mucosa, *** estrogen effect, no discharge, no lesions, *** pelvic support, *** cystocele and *** rectocele noted.  No cervical motion tenderness.  Uterus is freely mobile  and non-fixed.  No adnexal/parametria masses or tenderness noted.  Anus and perineum are without rashes or lesions.   ***  Neurologic: Grossly intact, no focal deficits, moving all 4 extremities. Psychiatric: Normal mood and affect.    Laboratory Data: Component     Latest Ref Rng 09/21/2023  Sodium     135 - 145 mmol/L 128 (L)   Potassium     3.5 - 5.1 mmol/L 3.9   Chloride     98 - 111 mmol/L 92 (L)   CO2     22 - 32 mmol/L 23   Glucose     70 - 99 mg/dL 098 (H)   BUN     8 - 23 mg/dL  36 (H)   Creatinine     0.44 - 1.00 mg/dL 4.69 (H)   Calcium     8.9 - 10.3 mg/dL 8.6 (L)   Anion gap     5 - 15  13   GFR, Estimated     >60 mL/min 7 (L)   Albumin     3.5 - 5.0 g/dL 2.6 (L)   Phosphorus     2.5 - 4.6 mg/dL 4.4     Legend: (L) Low (H) High  CBC    Component Value Date/Time   WBC 12.6 (H) 09/21/2023 0736   RBC 2.73 (L) 09/21/2023 0736   HGB 9.0 (L) 09/21/2023 0736   HGB 10.3 (L) 03/08/2022 1545   HCT 28.1 (L) 09/21/2023 0736   HCT 31.2 (L) 03/08/2022 1545   PLT 404 (H) 09/21/2023 0736   PLT 355 02/10/2022 0939   MCV 102.9 (H) 09/21/2023 0736   MCV 84 03/08/2022 1545   MCV 89 01/28/2014 0513   MCH 33.0 09/21/2023 0736   MCHC 32.0 09/21/2023 0736   RDW 16.6 (H) 09/21/2023 0736   RDW 16.6 (H) 03/08/2022 1545   RDW 14.2 01/28/2014 0513   LYMPHSABS 1.3 09/17/2023 0530   LYMPHSABS 2.0 03/08/2022 1545   LYMPHSABS 1.3 01/28/2014 0513   MONOABS 0.8 09/17/2023 0530   MONOABS 0.4 01/28/2014 0513   EOSABS 0.0 09/17/2023 0530   EOSABS 0.2 03/08/2022 1545   EOSABS 0.0 01/28/2014 0513   BASOSABS 0.1 09/17/2023 0530   BASOSABS 0.1 03/08/2022 1545   BASOSABS 0.1 01/28/2014 0513    Urinalysis See HPI and EPIC I have reviewed the labs.   Pertinent Imaging: Narrative & Impression  CLINICAL DATA:  Altered mental status.  Pain   EXAM: CT ABDOMEN AND PELVIS WITHOUT CONTRAST   TECHNIQUE: Multidetector CT imaging of the abdomen and pelvis was performed following  the standard protocol without IV contrast.   RADIATION DOSE REDUCTION: This exam was performed according to the departmental dose-optimization program which includes automated exposure control, adjustment of the mA and/or kV according to patient size and/or use of iterative reconstruction technique.   COMPARISON:  CT 07/30/2023 and older   FINDINGS: Lower chest: There is some linear opacity lung bases likely scar or atelectasis. No pleural effusion. Trace pericardial effusion.   Hepatobiliary: On this non IV contrast exam, the liver parenchyma is grossly preserved. Gallbladder is nondilated.   Pancreas: Unremarkable. No pancreatic ductal dilatation or surrounding inflammatory changes.   Spleen: Lobular spleen.  Stable.   Adrenals/Urinary Tract: Adrenal glands are preserved. Both kidneys show moderate renal collecting system dilatation, similar to previous. There is air within the left renal collecting system. Moderate bilateral perinephric stranding. There is also a right-sided ureteral stent with pigtail in the anterior right mid kidney collecting system and distal in the bladder. Stable caliber of the right ureter. Mild wall thickening of the urinary bladder with slight stranding. Please correlate with clinical history.   Stomach/Bowel: On this non oral contrast exam, the large bowel has a normal course and caliber. Moderate right-sided colonic stool. Slightly redundant course of the transverse colon. Normal appendix in the right hemipelvis posterior to the cecum. Few left-sided colonic diverticula. The stomach and small bowel are nondilated.   Vascular/Lymphatic: Normal caliber aorta and IVC with scattered vascular calcifications. There are some small nodes identified along retroperitoneum. These are unchanged from previous. No new lymph node enlargement.   Reproductive: Multiple calcified uterine fibroids. No separate adnexal mass.   Other: No free  intra-air  identified. Trace areas of fluid but decreased from the prior CT scan.   Musculoskeletal: Scattered degenerative changes of the spine and pelvis, moderate. Multilevel canal stenosis along the lumbar spine. Degenerative changes about the hips are particularly advanced.   IMPRESSION: Stable moderate bilateral renal collecting system dilatation. Indwelling right-sided ureteral stent. There is some air in the bladder and left renal collecting system. Please correlate with any recent instrumentation. Bladder wall is also slightly thickened with some adjacent stranding.   Previous peritoneal dialysis catheter no longer identified. Decreasing ascites.   Nonspecific bowel gas pattern with moderate colonic stool. Normal appendix.     Electronically Signed   By: Karen Kays M.D.   On: 09/17/2023 10:24   *** I have independently reviewed the films.  See HPI.    Assessment & Plan:    1. Right hydronephrosis -managed with chronic right ureteral stent  -She will be transitioning to dialysis in the near future and has an appointment with the surgeon for a catheter to be placed for peritoneal dialysis -Her desire is to keep the stent in place and continue exchanging, but if the surgeon feels the stent needs to come out we will plan for stent removal otherwise we will proceed with stent exchange in August/September  2. Left hydronephrosis -***  2. OAB -Her insurance company denied the approval of Myrbetriq -She feels she is at goal with a trospium IR 20 mg twice daily   No follow-ups on file.  These notes generated with voice recognition software. I apologize for typographical errors.  Cloretta Ned  [facility] [address]  [address] [place], [place] [address] [phone]

## 2023-11-06 ENCOUNTER — Other Ambulatory Visit: Payer: Self-pay

## 2023-11-06 ENCOUNTER — Encounter: Payer: Self-pay | Admitting: Urology

## 2023-11-06 ENCOUNTER — Ambulatory Visit: Payer: Medicare Other | Admitting: Urology

## 2023-11-06 VITALS — BP 189/91 | HR 84 | Ht 63.0 in | Wt 200.0 lb

## 2023-11-06 DIAGNOSIS — N1330 Unspecified hydronephrosis: Secondary | ICD-10-CM | POA: Diagnosis not present

## 2023-11-06 DIAGNOSIS — N3281 Overactive bladder: Secondary | ICD-10-CM

## 2023-11-06 LAB — MICROSCOPIC EXAMINATION
RBC, Urine: >30 /[HPF] — AB (ref 0–2)
WBC, UA: >30 /[HPF] — AB (ref 0–5)

## 2023-11-06 LAB — URINALYSIS, COMPLETE
Bilirubin, UA: NEGATIVE
Glucose, UA: NEGATIVE
Ketones, UA: NEGATIVE
Nitrite, UA: POSITIVE — AB
Specific Gravity, UA: 1.02 (ref 1.005–1.030)
Urobilinogen, Ur: 0.2 mg/dL (ref 0.2–1.0)
pH, UA: 7 (ref 5.0–7.5)

## 2023-11-06 NOTE — Progress Notes (Signed)
Surgical Physician Order Form [facility]  Dr. Irineo Axon, MD  * Scheduling expectation : Next Available with Dr. Lonna Cobb   *Length of Case:   *Clearance needed: yes w/ neurology (subdural hematoma) and w/ cardiology   *Anticoagulation Instructions: Hold all anticoagulants  *Aspirin Instructions: Hold Aspirin  *Post-op visit Date/Instructions:   TBD  *Diagnosis: Bilateral hydronephrosis  *Procedure: right  Cysto w/stent exchange (28413) with bilateral retrogrades and possible left ureteral stent placement    Additional orders: N/A  -Admit type: OUTpatient  -Anesthesia: General  -VTE Prophylaxis Standing Order SCD's       Other:   -Standing Lab Orders Per Anesthesia    Lab other: None  -Standing Test orders EKG/Chest x-ray per Anesthesia       Test other:   - Medications:  Ancef 2gm IV  -Other orders:  N/A

## 2023-11-09 LAB — CULTURE, URINE COMPREHENSIVE

## 2023-11-11 ENCOUNTER — Telehealth: Payer: Self-pay

## 2023-11-11 ENCOUNTER — Other Ambulatory Visit: Payer: Self-pay | Admitting: Urology

## 2023-11-11 MED ORDER — CIPROFLOXACIN HCL 250 MG PO TABS: ORAL_TABLET | ORAL | 0 refills | Status: DC

## 2023-11-11 NOTE — Telephone Encounter (Signed)
  Per Dr. Lonna Cobb, Patient is to be scheduled for Cystoscopy with Right Stent Exchange with Bilateral Retrograde Pyelograms and possible Left Stent Placement   Heather Lopez was contacted and possible surgical dates were discussed, Tuesday January 7th, 2025 was agreed upon for surgery.   Patient was directed to call [phone] between 1-3pm the day before surgery to find out surgical arrival time.  Instructions were given not to eat or drink from midnight on the night before surgery and have a driver for the day of surgery. On the surgery day patient was instructed to enter through the Medical Mall entrance of [facility] report the Same Day Surgery desk.   Pre-Admit Testing will be in contact via phone to set up an interview with the anesthesia team to review your history and medications prior to surgery.   Reminder of this information was sent via MyChart to the patient.

## 2023-11-11 NOTE — Progress Notes (Signed)
   [facility] Surgical Posting Form  Surgery Date: Date: 12/03/2023  Surgeon: Dr. Irineo Axon, MD  Inpt ( No  )   Outpt (Yes)   Obs ( No  )   Diagnosis: N13.30 Bilateral Hydronephrosis  -CPT: 81191, 47829,56213  Surgery: Cystoscopy with Right Stent Exchange with Bilateral Retrograde Pyelograms and possible Left Stent Placement  Stop Anticoagulations: Yes and also hold ASA  Cardiac/Medical/Pulmonary Clearance needed: no  *Orders entered into EPIC  Date: 11/11/23   *Case booked in [place]  Date: 11/08/2023  *Notified pt of Surgery: Date: 11/08/2023  PRE-OP UA & CX: No  *Placed into Prior Authorization Work [place] Date: 11/11/23  Assistant/laser/rep:No

## 2023-11-22 ENCOUNTER — Inpatient Hospital Stay: Admission: RE | Admit: 2023-11-22 | Payer: Medicare Other | Source: Ambulatory Visit

## 2023-11-22 HISTORY — DX: End stage renal disease: N18.6

## 2023-11-22 HISTORY — DX: Spontaneous bacterial peritonitis: K65.2

## 2023-11-22 HISTORY — DX: Anemia in chronic kidney disease: N18.9

## 2023-11-22 HISTORY — DX: Anemia in chronic kidney disease: D63.1

## 2023-11-22 HISTORY — DX: Essential (primary) hypertension: I10

## 2023-11-22 NOTE — Pre-Procedure Instructions (Signed)
Unable to complete PAT interview today; patient stated that her son was about to die. Interview rescheduled for Tuesday, December 31.

## 2023-11-26 ENCOUNTER — Encounter: Payer: Self-pay | Admitting: Urgent Care

## 2023-11-26 ENCOUNTER — Other Ambulatory Visit: Payer: Self-pay

## 2023-11-26 ENCOUNTER — Inpatient Hospital Stay: Admission: RE | Admit: 2023-11-26 | Payer: Medicare Other | Source: Ambulatory Visit

## 2023-11-26 ENCOUNTER — Encounter
Admission: RE | Admit: 2023-11-26 | Discharge: 2023-11-26 | Disposition: A | Discharge status: Home / Self Care | Payer: Medicare Other | Source: Ambulatory Visit | Attending: Urology | Admitting: Urology

## 2023-11-26 ENCOUNTER — Other Ambulatory Visit (INDEPENDENT_AMBULATORY_CARE_PROVIDER_SITE_OTHER): Payer: Self-pay | Admitting: Nurse Practitioner

## 2023-11-26 VITALS — Ht 63.0 in | Wt 195.0 lb

## 2023-11-26 DIAGNOSIS — N186 End stage renal disease: Secondary | ICD-10-CM

## 2023-11-26 DIAGNOSIS — E1122 Type 2 diabetes mellitus with diabetic chronic kidney disease: Secondary | ICD-10-CM

## 2023-11-26 DIAGNOSIS — E875 Hyperkalemia: Secondary | ICD-10-CM

## 2023-11-26 NOTE — Patient Instructions (Addendum)
 Your procedure is scheduled on: Tuesday 12/03/23 To find out your arrival time, please call [phone] between 1PM - 3PM on: Monday 12/02/23    Report to the Registration Desk on the 1st floor of the Medical Mall. FREE Valet parking is available.  If your arrival time is 6:00 am, do not arrive before that time as the Medical Mall entrance doors do not open until 6:00 am.  REMEMBER: Instructions that are not followed completely may result in serious medical risk, up to and including death; or upon the discretion of your surgeon and anesthesiologist your surgery may need to be rescheduled.  Do not eat food or drink any liquids after midnight the night before surgery.  No gum chewing or hard candies.  One week prior to surgery: Stop Anti-inflammatories (NSAIDS) such as Advil, Aleve, Ibuprofen, Motrin, Naproxen, Naprosyn and Aspirin  based products such as Excedrin, Goody's Powder, BC Powder. You may however, continue to take Tylenol  if needed for pain up until the day of surgery.  Stop ANY OVER THE COUNTER supplements for 7 days until after surgery.  Continue taking all prescribed medications EXCEPT: DECREASE BEDTIME INSULIN  THE NIGHT BEFORE (12/02/23) TO HALF, 7-8 UNITS. NO INSULIN  TUESDAY MORNING 12/03/23  TAKE ONLY THESE MEDICATIONS THE MORNING OF SURGERY WITH A SIP OF WATER :  buPROPion  (WELLBUTRIN  XL) 150 MG 24 hr tablet  busPIRone  (BUSPAR ) 5 MG tablet  cloNIDine  (CATAPRES ) 0.1 MG tablet  metoprolol  tartrate (LOPRESSOR ) 100 MG tablet  trospium  (SANCTURA ) 20 MG tablet   No Alcohol for 24 hours before or after surgery.  No Smoking including e-cigarettes for 24 hours before surgery.  No chewable tobacco products for at least 6 hours before surgery.  No nicotine patches on the day of surgery.  Do not use any recreational drugs for at least a week (preferably 2 weeks) before your surgery.  Please be advised that the combination of cocaine and anesthesia may have negative outcomes, up to  and including death. If you test positive for cocaine, your surgery will be cancelled.  On the morning of surgery brush your teeth with toothpaste and water , you may rinse your mouth with mouthwash if you wish. Do not swallow any toothpaste or mouthwash.  Use CHG Soap or wipes as directed on instruction sheet. Bathe as usual that morning  Do not wear lotions, powders, or perfumes.   Do not shave body hair from the neck down 48 hours before surgery.  Wear comfortable clothing (specific to your surgery type) to the hospital.  Do not wear jewelry, make-up, hairpins, clips or nail polish.  For welded (permanent) jewelry: bracelets, anklets, waist bands, etc.  Please have this removed prior to surgery.  If it is not removed, there is a chance that hospital personnel will need to cut it off on the day of surgery. Contact lenses, hearing aids and dentures may not be worn into surgery.  Do not bring valuables to the hospital. [facility] is not responsible for any missing/lost belongings or valuables.   Notify your doctor if there is any change in your medical condition (cold, fever, infection).  If you are being discharged the day of surgery, you will not be allowed to drive home. You will need a responsible individual to drive you home and stay with you for 24 hours after surgery.   If you are taking public transportation, you will need to have a responsible individual with you.  If you are being admitted to the hospital overnight, leave your suitcase in  the car. After surgery it may be brought to your room.  In case of increased patient census, it may be necessary for you, the patient, to continue your postoperative care in the Same Day Surgery department.  After surgery, you can help prevent lung complications by doing breathing exercises.  Take deep breaths and cough every 1-2 hours. Your doctor may order a device called an Incentive Spirometer to help you take deep breaths. When  coughing or sneezing, hold a pillow firmly against your incision with both hands. This is called "splinting." Doing this helps protect your incision. It also decreases belly discomfort.  Surgery Visitation Policy:  Patients undergoing a surgery or procedure may have two family members or support persons with them as long as the person is not COVID-19 positive or experiencing its symptoms.   Inpatient Visitation:    Visiting hours are 7 a.m. to 8 p.m. Up to four visitors are allowed at one time in a patient room. The visitors may rotate out with other people during the day. One designated support person (adult) may remain overnight.  Please call the Pre-admissions Testing Dept. at [phone] if you have any questions about these instructions.

## 2023-11-28 NOTE — Progress Notes (Signed)
 Clois Fret, MD  Physician Neurosurgery   Consult Note    Signed   Date of Service: 09/17/2023  8:21 AM   Signed      Full note to follow.   Patient coming to ER with AMS.  Has small parafalcine SDH with fall several days ago.  She has UTI and is on HD for ESRD.   CT reviewed - very small SDH   A/P: No need for re-imaging given history of fall a couple of days ago and another explanation for AMS Cleared from neurosurgical standpoint - no need for surgery Ok to continue ASA 81

## 2023-11-29 ENCOUNTER — Ambulatory Visit (INDEPENDENT_AMBULATORY_CARE_PROVIDER_SITE_OTHER): Payer: Medicare Other | Admitting: Nurse Practitioner

## 2023-11-29 ENCOUNTER — Encounter (INDEPENDENT_AMBULATORY_CARE_PROVIDER_SITE_OTHER): Payer: Self-pay | Admitting: Nurse Practitioner

## 2023-11-29 ENCOUNTER — Ambulatory Visit (INDEPENDENT_AMBULATORY_CARE_PROVIDER_SITE_OTHER): Payer: Medicare Other

## 2023-11-29 ENCOUNTER — Encounter: Payer: Self-pay | Admitting: Internal Medicine

## 2023-11-29 VITALS — BP 173/83 | HR 75 | Resp 18 | Ht 63.0 in | Wt 195.0 lb

## 2023-11-29 DIAGNOSIS — E1122 Type 2 diabetes mellitus with diabetic chronic kidney disease: Secondary | ICD-10-CM | POA: Diagnosis not present

## 2023-11-29 DIAGNOSIS — N186 End stage renal disease: Secondary | ICD-10-CM

## 2023-11-29 DIAGNOSIS — I10 Essential (primary) hypertension: Secondary | ICD-10-CM | POA: Diagnosis not present

## 2023-11-29 DIAGNOSIS — Z992 Dependence on renal dialysis: Secondary | ICD-10-CM

## 2023-11-29 DIAGNOSIS — Z794 Long term (current) use of insulin: Secondary | ICD-10-CM

## 2023-11-30 NOTE — Progress Notes (Signed)
 Subjective:    Patient ID: Heather Lopez, female    DOB: Jun 18, 1946, 78 y.o.   MRN: [id number] Chief Complaint  Patient presents with   New Patient (Initial Visit)    np. ue art dup + ue -map + consult. mapping for new access. singh, taran    The patient is seen for evaluation for dialysis access.  Currently the patient is being dialyzed via PermCath. The patient is followed by nephrology.    The patient is right-handed.  The patient has been considering the various methods of dialysis and wishes to proceed with hemodialysis and therefore creation of AV access is indicated.  No recent shortening of the patient's walking distance or new symptoms consistent with claudication.  No history of rest pain symptoms. No new ulcers or wounds of the lower extremities have occurred.  The patient denies amaurosis fugax or recent TIA symptoms. There are no recent neurological changes noted. There is no history of DVT, PE or superficial thrombophlebitis. No recent episodes of angina or shortness of breath documented.   Today's study shows adequate access for creation of a brachiocephalic AV fistula    Review of Systems  Neurological:  Positive for weakness.  All other systems reviewed and are negative.      Objective:   Physical Exam Vitals reviewed.  HENT:     Head: Normocephalic.  Cardiovascular:     Rate and Rhythm: Normal rate.     Pulses:          Radial pulses are 2+ on the left side.  Pulmonary:     Effort: Pulmonary effort is normal.  Skin:    General: Skin is warm and dry.  Neurological:     Mental Status: She is alert and oriented to person, place, and time.  Psychiatric:        Mood and Affect: Mood normal.        Behavior: Behavior normal.        Thought Content: Thought content normal.        Judgment: Judgment normal.     BP (!) 173/83   Pulse 75   Resp 18   Ht 5' 3 (1.6 m)   Wt 195 lb (88.5 kg)   BMI 34.54 kg/m   Past Medical History:  Diagnosis Date    Acute renal failure (HCC) 06/27/2021   Anemia in chronic kidney disease    Anxiety    Aortic atherosclerosis (HCC)    Cataract    Depression    Dysrhythmia    ESRD on dialysis (HCC)    GERD (gastroesophageal reflux disease)    Gout    History of kidney stones    Hyperlipidemia    Hypertension, essential    Nausea & vomiting 12/02/2021   Obesity    Obstruction of left ureteropelvic junction (UPJ) due to stone 06/2021   Osteoarthritis    Perforated duodenal ulcer (HCC) 06/27/2021   Pyonephrosis 06/2021   left   Septic shock (HCC) 06/27/2021   Spontaneous bacterial peritonitis (HCC)    Subdural hematoma (HCC) 08/2023   Type 2 diabetes mellitus with stage 5 chronic kidney disease (HCC)    Vertigo     Social History   Socioeconomic History   Marital status: Married    Spouse name: Elspeth   Number of children: Not on file   Years of education: Not on file   Highest education level: Not on file  Occupational History   Not on file  Tobacco Use  Smoking status: Former    Current packs/day: 0.00    Average packs/day: 0.8 packs/day for 6.0 years (4.5 ttl pk-yrs)    Types: Cigarettes    Start date: 34    Quit date: 36    Years since quitting: 52.0   Smokeless tobacco: Never   Tobacco comments:    quit 1973  Vaping Use   Vaping status: Never Used  Substance and Sexual Activity   Alcohol use: No    Alcohol/week: 0.0 standard drinks of alcohol   Drug use: No   Sexual activity: Not on file  Other Topics Concern   Not on file  Social History Narrative   Not on file   Social Drivers of Health   Financial Resource Strain: Low Risk  (07/14/2023)   Received from [facility]   Overall Financial Resource Strain (CARDIA)    Difficulty of Paying Living Expenses: Not very hard  Food Insecurity: No Food Insecurity (09/17/2023)   Hunger Vital Sign    Worried About Running Out of Food in the Last Year: Never true    Ran Out of Food in the Last Year: Never true   Transportation Needs: No Transportation Needs (09/17/2023)   PRAPARE - Administrator, Civil Service (Medical): No    Lack of Transportation (Non-Medical): No  Physical Activity: Unknown (07/14/2023)   Received [facility]   Exercise Vital Sign    Days of Exercise per Week: 0 days    Minutes of Exercise per Session: Not on file  Stress: No Stress Concern Present (07/14/2023)   Received from [facility] - Occupational Stress Questionnaire    Feeling of Stress : Not at all  Social Connections: Socially Integrated (07/14/2023)   Received from [facility]   Social Network    How would you rate your social network (family, work, friends)?: Good participation with social networks  Intimate Partner Violence: Not At Risk (09/17/2023)   Humiliation, Afraid, Rape, and Kick questionnaire    Fear of Current or Ex-Partner: No    Emotionally Abused: No    Physically Abused: No    Sexually Abused: No    Past Surgical History:  Procedure Laterality Date   CAPD REMOVAL N/A 08/02/2023   Procedure: CONTINUOUS AMBULATORY PERITONEAL DIALYSIS  (CAPD) CATHETER REMOVAL;  Surgeon: Jordis Laneta FALCON, MD;  Location: [facility];  Service: General;  Laterality: N/A;   CATARACT EXTRACTION, BILATERAL Bilateral 2019   COLONOSCOPY WITH PROPOFOL  N/A 08/19/2015   Procedure: COLONOSCOPY WITH PROPOFOL ;  Surgeon: Rogelia Copping, MD;  Location: [facility];  Service: Endoscopy;  Laterality: N/A;  diabetic - insulin    CYSTOSCOPY W/ RETROGRADES Bilateral 12/22/2019   Procedure: CYSTOSCOPY WITH RETROGRADE PYELOGRAM;  Surgeon: Twylla Glendia BROCKS, MD;  Location: [facility];  Service: Urology;  Laterality: Bilateral;   CYSTOSCOPY W/ URETERAL STENT PLACEMENT Bilateral 07/05/2021   Procedure: CYSTOSCOPY WITH RETROGRADE PYELOGRAM/BILATERAL URETERAL STENT PLACEMENT;  Surgeon: Twylla Glendia BROCKS, MD;  Location: [facility];  Service: Urology;  Laterality: Bilateral;    CYSTOSCOPY W/ URETERAL STENT PLACEMENT Bilateral 12/26/2021   Procedure: CYSTOSCOPY WITH STENT REMOVAL;  Surgeon: Twylla Glendia BROCKS, MD;  Location: [facility];  Service: Urology;  Laterality: Bilateral;   CYSTOSCOPY W/ URETERAL STENT PLACEMENT Right 03/13/2022   Procedure: CYSTOSCOPY WITH RETROGRADE PYELOGRAM/URETERAL STENT PLACEMENT;  Surgeon: Twylla Glendia BROCKS, MD;  Location: [facility];  Service: Urology;  Laterality: Right;   CYSTOSCOPY W/ URETERAL STENT PLACEMENT Right 01/01/2023  Procedure: CYSTOSCOPY WITH STENT EXCHANGE;  Surgeon: Twylla Glendia BROCKS, MD;  Location: [facility];  Service: Urology;  Laterality: Right;   CYSTOSCOPY W/ URETERAL STENT REMOVAL Right 05/08/2022   Procedure: CYSTOSCOPY WITH STENT REMOVAL;  Surgeon: Twylla Glendia BROCKS, MD;  Location: [facility];  Service: Urology;  Laterality: Right;   CYSTOSCOPY WITH BIOPSY N/A 12/22/2019   Procedure: CYSTOSCOPY WITH bladder BIOPSY;  Surgeon: Twylla Glendia BROCKS, MD;  Location: [facility];  Service: Urology;  Laterality: N/A;   CYSTOSCOPY WITH BIOPSY  03/13/2022   Procedure: CYSTOSCOPY WITH BLADDER BIOPSY;  Surgeon: Twylla Glendia BROCKS, MD;  Location: [facility];  Service: Urology;;   CYSTOSCOPY WITH STENT PLACEMENT Right 12/22/2019   Procedure: CYSTOSCOPY WITH STENT PLACEMENT;  Surgeon: Twylla Glendia BROCKS, MD;  Location: [facility];  Service: Urology;  Laterality: Right;   CYSTOSCOPY WITH STENT PLACEMENT Right 07/03/2022   Procedure: CYSTOSCOPY/RIGHT URETEROSCOPY WITH STENT PLACEMENT;  Surgeon: Twylla Glendia BROCKS, MD;  Location: [facility];  Service: Urology;  Laterality: Right;   CYSTOSCOPY WITH URETEROSCOPY Bilateral 12/22/2019   Procedure: CYSTOSCOPY WITH URETEROSCOPY;  Surgeon: Twylla Glendia BROCKS, MD;  Location: [facility];  Service: Urology;  Laterality: Bilateral;   CYSTOSCOPY/URETEROSCOPY/HOLMIUM LASER/STENT PLACEMENT Right 12/22/2019   Procedure: CYSTOSCOPY/URETEROSCOPY/HOLMIUM LASER/STENT PLACEMENT;  Surgeon: Twylla Glendia BROCKS, MD;  Location: [facility];   Service: Urology;  Laterality: Right;   CYSTOSCOPY/URETEROSCOPY/HOLMIUM LASER/STENT PLACEMENT Bilateral 12/08/2021   Procedure: CYSTOSCOPY/URETEROSCOPY/HOLMIUM LASER/STENT PLACEMENT;  Surgeon: Francisca Redell BROCKS, MD;  Location: [facility];  Service: Urology;  Laterality: Bilateral;   DIALYSIS/PERMA CATHETER INSERTION N/A 06/29/2021   Procedure: DIALYSIS/PERMA CATHETER INSERTION;  Surgeon: Marea Selinda RAMAN, MD;  Location: [facility];  Service: Cardiovascular;  Laterality: N/A;   DIALYSIS/PERMA CATHETER INSERTION N/A 07/13/2021   Procedure: DIALYSIS/PERMA CATHETER INSERTION;  Surgeon: Marea Selinda RAMAN, MD;  Location: [facility];  Service: Cardiovascular;  Laterality: N/A;   DIALYSIS/PERMA CATHETER INSERTION N/A 08/01/2023   Procedure: DIALYSIS/PERMA CATHETER INSERTION;  Surgeon: Marea Selinda RAMAN, MD;  Location: [facility];  Service: Cardiovascular;  Laterality: N/A;   EYE SURGERY     INCISIONAL HERNIA REPAIR  06/28/2023   IR GASTROSTOMY TUBE MOD SED  08/01/2021   LAPAROTOMY N/A 06/29/2021   Procedure: EXPLORATORY LAPAROTOMY WITH REPAIR OF DUODENAL PERFORATION;  Surgeon: Desiderio Schanz, MD;  Location: [facility];  Service: General;  Laterality: N/A;   POLYPECTOMY  08/19/2015   Procedure: POLYPECTOMY;  Surgeon: Rogelia Copping, MD;  Location: [facility];  Service: Endoscopy;;   TUBAL LIGATION  1992    Family History  Problem Relation Age of Onset   Diabetes Father    Cancer Father        lung cancer   Diabetes Brother    Stroke Mother     Allergies  Allergen Reactions   Contrast Media  [Iodinated Contrast Media] Anaphylaxis   Iodine  Swelling    (IV only) - angioedema       Latest Ref Rng & Units 09/21/2023    7:36 AM 09/19/2023    8:28 AM 09/18/2023    3:14 AM  CBC  WBC 4.0 - 10.5 K/uL 12.6  10.0  10.3   Hemoglobin 12.0 - 15.0 g/dL 9.0  8.7  8.9   Hematocrit 36.0 - 46.0 % 28.1  27.5  28.0   Platelets 150 - 400 K/uL 404  382  343       CMP     Component  Value Date/Time   NA 128 (L) 09/21/2023 0736  NA 138 10/03/2022 1350   NA 133 (L) 01/29/2014 0406   K 3.9 09/21/2023 0736   K 4.0 01/29/2014 0406   CL 92 (L) 09/21/2023 0736   CL 96 (L) 01/29/2014 0406   CO2 23 09/21/2023 0736   CO2 29 01/29/2014 0406   GLUCOSE 204 (H) 09/21/2023 0736   GLUCOSE 377 (H) 01/29/2014 0406   BUN 36 (H) 09/21/2023 0736   BUN 39 (H) 10/03/2022 1350   BUN 31 (H) 01/29/2014 0406   CREATININE 5.67 (H) 09/21/2023 0736   CREATININE 1.17 (H) 03/17/2018 0830   CALCIUM  8.6 (L) 09/21/2023 0736   CALCIUM  9.7 01/29/2014 0406   PROT 7.2 09/17/2023 0530   PROT 6.9 02/10/2022 0939   PROT 8.1 01/27/2014 1318   ALBUMIN  2.6 (L) 09/21/2023 0736   ALBUMIN  3.8 02/10/2022 0939   ALBUMIN  4.1 01/27/2014 1318   AST 36 09/17/2023 0530   AST 36 01/27/2014 1318   ALT 33 09/17/2023 0530   ALT 30 01/27/2014 1318   ALKPHOS 113 09/17/2023 0530   ALKPHOS 110 01/27/2014 1318   BILITOT 0.6 09/17/2023 0530   BILITOT <0.2 02/10/2022 0939   BILITOT 0.5 01/27/2014 1318   EGFR 18 (L) 10/03/2022 1350   GFRNONAA 7 (L) 09/21/2023 0736   GFRNONAA 48 (L) 08/21/2016 0908     No results found.     Assessment & Plan:   1. ESRD on dialysis [facility]) (Primary) Recommend:  At this time the patient does not have appropriate extremity access for dialysis  Patient should have a left brachiocephalic AV fistula created, however if vein diameters are found to be an adequate a break ax graft should be inserted.  The risks, benefits and alternative therapies were reviewed in detail with the patient.  All questions were answered.  The patient agrees to proceed with surgery.   The patient will follow up with me in the office after the surgery.  2. Essential hypertension Continue antihypertensive medications as already ordered, these medications have been reviewed and there are no changes at this time.  3. Type 2 diabetes mellitus with chronic kidney disease on chronic dialysis, with long-term  current use of insulin  (HCC) Continue hypoglycemic medications as already ordered, these medications have been reviewed and there are no changes at this time.  Hgb A1C to be monitored as already arranged by primary service  Current Outpatient Medications on File Prior to Visit  Medication Sig Dispense Refill   aspirin  81 MG EC tablet Take 81 mg by mouth daily.     atorvastatin  (LIPITOR) 20 MG tablet Take 20 mg by mouth at bedtime.     buPROPion  (WELLBUTRIN  XL) 150 MG 24 hr tablet Take 150 mg by mouth daily.     busPIRone  (BUSPAR ) 5 MG tablet Take 5 mg by mouth daily.     ciprofloxacin  (CIPRO ) 250 MG tablet Take one 250 mg tablet on the days you have dialysis after you complete dialysis (Patient not taking: Reported on 11/26/2023) 14 tablet 0   cloNIDine  (CATAPRES ) 0.1 MG tablet Take 1 tablet (0.1 mg total) by mouth 2 (two) times daily. 60 tablet 11   ferrous sulfate  325 (65 FE) MG tablet Take 325 mg by mouth daily with breakfast.     insulin  aspart (NOVOLOG ) 100 UNIT/ML injection Inject 3 Units into the skin 3 (three) times daily before meals. 10 mL 11   insulin  glargine (LANTUS ) 100 UNIT/ML injection Inject 0.15 mLs (15 Units total) into the skin at bedtime. 10 mL 11  loperamide  (IMODIUM  A-D) 2 MG tablet Take 2 mg by mouth every 6 (six) hours as needed for diarrhea or loose stools.     metoprolol  tartrate (LOPRESSOR ) 100 MG tablet Take 1 tablet (100 mg total) by mouth 2 (two) times daily. 60 tablet 2   Multiple Vitamin (MULTIVITAMIN WITH MINERALS) TABS tablet Take 1 tablet by mouth daily. 30 tablet 3   trospium  (SANCTURA ) 20 MG tablet Take 1 tablet (20 mg total) by mouth 2 (two) times daily. 180 tablet 3   lactulose  (CHRONULAC ) 10 GM/15ML solution Take 45 mLs (30 g total) by mouth 2 (two) times daily as needed for mild constipation or moderate constipation. (Patient not taking: Reported on 11/29/2023) 236 mL 0   ondansetron  (ZOFRAN ) 4 MG tablet Take 4 mg by mouth every 8 (eight) hours as needed.  (Patient not taking: Reported on 11/29/2023)     polyethylene glycol (MIRALAX  / GLYCOLAX ) 17 g packet Take 17 g by mouth daily as needed for mild constipation or moderate constipation. (Patient not taking: Reported on 11/29/2023)     senna-docusate (SENOKOT-S) 8.6-50 MG tablet Take 1 tablet by mouth at bedtime as needed for mild constipation or moderate constipation. (Patient not taking: Reported on 11/29/2023)     sodium bicarbonate  650 MG tablet Take 650 mg by mouth 2 (two) times daily. (Patient not taking: Reported on 11/29/2023)     VELTASSA  8.4 g packet Take 8.4 g by mouth daily. MIX 1 PACKET IN 1/3 CUP OF WATER  AND DRINK BY MOUTH DAILY. (Patient not taking: Reported on 11/29/2023)     No current facility-administered medications on file prior to visit.    There are no Patient Instructions on file for this visit. No follow-ups on file.   Louine Tenpenny E Dub Maclellan, NP

## 2023-12-02 ENCOUNTER — Encounter: Payer: Self-pay | Admitting: Internal Medicine

## 2023-12-02 NOTE — Progress Notes (Signed)
 Faxed over clearance request to PCP Carren Rang PA) due to recent hospitalizations and multiple medical comorbidities

## 2023-12-02 NOTE — Progress Notes (Signed)
 Called over to receptionist at [facility] to find out the status of clearance. Receptionist states that she talked with Edsel Lanes (PA) nurse and she said they are waiting on labwork to come back which wont be back until 6 pm tonight. She states office will fax over clearance first thing in the morning. Office has fax number on clearance sheet of where to fax. Left note for Katie/Denise to look on fax machine as soon as she gets here so clearance can be taken up to sds

## 2023-12-02 NOTE — Pre-Procedure Instructions (Signed)
 Called over to [facility] internal medicine to find out status of clearance. Receptionist states pt has appt with Carren Rang PA today at [phone]

## 2023-12-03 ENCOUNTER — Other Ambulatory Visit: Payer: Self-pay

## 2023-12-03 ENCOUNTER — Telehealth (INDEPENDENT_AMBULATORY_CARE_PROVIDER_SITE_OTHER): Payer: Self-pay

## 2023-12-03 ENCOUNTER — Encounter
Admission: RE | Disposition: A | Discharge status: Home / Self Care | Payer: Self-pay | Source: Home / Self Care | Attending: Urology

## 2023-12-03 ENCOUNTER — Encounter: Payer: Self-pay | Admitting: Urology

## 2023-12-03 ENCOUNTER — Ambulatory Visit
Admission: RE | Admit: 2023-12-03 | Discharge: 2023-12-03 | Disposition: A | Discharge status: Home / Self Care | Payer: Medicare Other | Attending: Urology | Admitting: Urology

## 2023-12-03 ENCOUNTER — Encounter: Payer: Self-pay | Admitting: Emergency Medicine

## 2023-12-03 ENCOUNTER — Emergency Department
Admission: EM | Admit: 2023-12-03 | Discharge: 2023-12-03 | Disposition: A | Discharge status: Home / Self Care | Payer: Medicare Other | Source: Home / Self Care | Attending: Emergency Medicine | Admitting: Emergency Medicine

## 2023-12-03 DIAGNOSIS — E1165 Type 2 diabetes mellitus with hyperglycemia: Secondary | ICD-10-CM | POA: Insufficient documentation

## 2023-12-03 DIAGNOSIS — N186 End stage renal disease: Secondary | ICD-10-CM | POA: Insufficient documentation

## 2023-12-03 DIAGNOSIS — E1122 Type 2 diabetes mellitus with diabetic chronic kidney disease: Secondary | ICD-10-CM | POA: Insufficient documentation

## 2023-12-03 DIAGNOSIS — N1330 Unspecified hydronephrosis: Secondary | ICD-10-CM | POA: Diagnosis present

## 2023-12-03 DIAGNOSIS — E875 Hyperkalemia: Secondary | ICD-10-CM

## 2023-12-03 DIAGNOSIS — Z539 Procedure and treatment not carried out, unspecified reason: Secondary | ICD-10-CM | POA: Diagnosis not present

## 2023-12-03 DIAGNOSIS — Z992 Dependence on renal dialysis: Secondary | ICD-10-CM | POA: Insufficient documentation

## 2023-12-03 DIAGNOSIS — I120 Hypertensive chronic kidney disease with stage 5 chronic kidney disease or end stage renal disease: Secondary | ICD-10-CM | POA: Insufficient documentation

## 2023-12-03 DIAGNOSIS — Z85038 Personal history of other malignant neoplasm of large intestine: Secondary | ICD-10-CM | POA: Insufficient documentation

## 2023-12-03 DIAGNOSIS — R739 Hyperglycemia, unspecified: Secondary | ICD-10-CM

## 2023-12-03 LAB — CBC
HCT: 33.4 % — ABNORMAL LOW (ref 36.0–46.0)
Hemoglobin: 10.5 g/dL — ABNORMAL LOW (ref 12.0–15.0)
MCH: 31.3 pg (ref 26.0–34.0)
MCHC: 31.4 g/dL (ref 30.0–36.0)
MCV: 99.7 fL (ref 80.0–100.0)
Platelets: 232 10*3/uL (ref 150–400)
RBC: 3.35 MIL/uL — ABNORMAL LOW (ref 3.87–5.11)
RDW: 16.8 % — ABNORMAL HIGH (ref 11.5–15.5)
WBC: 5.9 10*3/uL (ref 4.0–10.5)
nRBC: 0 % (ref 0.0–0.2)

## 2023-12-03 LAB — POCT I-STAT, CHEM 8
BUN: 38 mg/dL — ABNORMAL HIGH (ref 8–23)
Calcium, Ion: 1.16 mmol/L (ref 1.15–1.40)
Chloride: 100 mmol/L (ref 98–111)
Creatinine, Ser: 4.4 mg/dL — ABNORMAL HIGH (ref 0.44–1.00)
Glucose, Bld: 394 mg/dL — ABNORMAL HIGH (ref 70–99)
HCT: 33 % — ABNORMAL LOW (ref 36.0–46.0)
Hemoglobin: 11.2 g/dL — ABNORMAL LOW (ref 12.0–15.0)
Potassium: 3.9 mmol/L (ref 3.5–5.1)
Sodium: 137 mmol/L (ref 135–145)
TCO2: 22 mmol/L (ref 22–32)

## 2023-12-03 LAB — BASIC METABOLIC PANEL
Anion gap: 13 (ref 5–15)
BUN: 39 mg/dL — ABNORMAL HIGH (ref 8–23)
CO2: 22 mmol/L (ref 22–32)
Calcium: 8.6 mg/dL — ABNORMAL LOW (ref 8.9–10.3)
Chloride: 100 mmol/L (ref 98–111)
Creatinine, Ser: 4.01 mg/dL — ABNORMAL HIGH (ref 0.44–1.00)
GFR, Estimated: 11 mL/min — ABNORMAL LOW (ref >60)
Glucose, Bld: 357 mg/dL — ABNORMAL HIGH (ref 70–99)
Potassium: 3.6 mmol/L (ref 3.5–5.1)
Sodium: 135 mmol/L (ref 135–145)

## 2023-12-03 LAB — CBG MONITORING, ED
Glucose-Capillary: 352 mg/dL — ABNORMAL HIGH (ref 70–99)
Glucose-Capillary: 275 mg/dL — ABNORMAL HIGH (ref 70–99)

## 2023-12-03 SURGERY — CYSTOSCOPY WITH STENT REPLACEMENT
Anesthesia: General | Laterality: Right

## 2023-12-03 MED ORDER — CHLORHEXIDINE GLUCONATE 0.12 % MT SOLN
OROMUCOSAL | Status: AC
  Filled 2023-12-03: qty 15

## 2023-12-03 MED ORDER — SODIUM CHLORIDE 0.9 % IV SOLN: INTRAVENOUS | Status: DC

## 2023-12-03 MED ORDER — INSULIN ASPART 100 UNIT/ML IJ SOLN
INTRAMUSCULAR | Status: AC
  Filled 2023-12-03: qty 1

## 2023-12-03 MED ORDER — CHLORHEXIDINE GLUCONATE 0.12 % MT SOLN
15.0000 mL | Freq: Once | OROMUCOSAL | Status: AC
  Administered 2023-12-03: 15 mL via OROMUCOSAL

## 2023-12-03 MED ORDER — CEFAZOLIN SODIUM-DEXTROSE 2-4 GM/100ML-% IV SOLN
INTRAVENOUS | Status: AC
  Filled 2023-12-03: qty 100

## 2023-12-03 MED ORDER — PROPOFOL 10 MG/ML IV BOLUS
INTRAVENOUS | Status: AC
  Filled 2023-12-03: qty 20

## 2023-12-03 MED ORDER — FENTANYL CITRATE (PF) 100 MCG/2ML IJ SOLN
INTRAMUSCULAR | Status: AC
  Filled 2023-12-03: qty 2

## 2023-12-03 MED ORDER — ONDANSETRON HCL 4 MG/2ML IJ SOLN
INTRAMUSCULAR | Status: AC
  Filled 2023-12-03: qty 2

## 2023-12-03 MED ORDER — ORAL CARE MOUTH RINSE: 15.0000 mL | Freq: Once | OROMUCOSAL | Status: AC

## 2023-12-03 MED ORDER — LIDOCAINE HCL (PF) 2 % IJ SOLN
INTRAMUSCULAR | Status: AC
  Filled 2023-12-03: qty 5

## 2023-12-03 MED ORDER — INSULIN ASPART 100 UNIT/ML IJ SOLN
15.0000 [IU] | Freq: Once | INTRAMUSCULAR | Status: AC
  Administered 2023-12-03: 15 [IU] via SUBCUTANEOUS

## 2023-12-03 MED ORDER — DEXAMETHASONE SODIUM PHOSPHATE 10 MG/ML IJ SOLN
INTRAMUSCULAR | Status: AC
  Filled 2023-12-03: qty 1

## 2023-12-03 MED ORDER — CEFAZOLIN SODIUM-DEXTROSE 2-4 GM/100ML-% IV SOLN: 2.0000 g | INTRAVENOUS | Status: DC

## 2023-12-03 NOTE — Inpatient Diabetes Management (Signed)
 Inpatient Diabetes Program Recommendations  AACE/ADA: New Consensus Statement on Inpatient Glycemic Control (2015)  Target Ranges:  Prepandial:   less than 140 mg/dL      Peak postprandial:   less than 180 mg/dL (1-2 hours)      Critically ill patients:  140 - 180 mg/dL   Lab Results  Component Value Date   GLUCAP 352 (H) 12/03/2023   HGBA1C 7.2 (H) 07/31/2023    Latest Reference Range & Units 12/03/23 09:51  Glucose-Capillary 70 - 99 mg/dL 647 (H)  (H): Data is abnormally high  Diabetes history: DM2 Outpatient Diabetes medications: Lantus  15 units daily, Novolog  3 units tid meal coverage Current orders for Inpatient glycemic control: Novolog  15 units given x 1 @ 0914 am  Inpatient Diabetes Program Recommendations:   Noted per note patient did not take insulin  yesterday and arrived for surgery with glucose 394. Novolog  15 units given x 1 so may anticipate hypoglycemia.  Please consider when CBGs stablelize from Novolog  given. -Add Semglee  8 units daily -Novolog  0-6 units q 4 hrs. While NPO, then tid, 0-5 units hs   Thank you, Zekiah Caruth E. Loni Abdon, RN, MSN, CDCES  Diabetes Coordinator Inpatient Glycemic Control Team Team Pager [phone] (8am-5pm) 12/03/2023 10:35 AM

## 2023-12-03 NOTE — Progress Notes (Signed)
 Patient with CBG 394 on arrival. Dr. Leavy and [facility] notified. Insulin  given as ordered.  Surgery canceled. Patient taken to ER via wheel chair, husband aware. Saline lock left in place per [facility] ER charge nurse instruction. Triage nurse given report and instructed to recheck CBG after insulin 

## 2023-12-03 NOTE — ED Triage Notes (Signed)
 FIRST NURSE NOTE:  Pt arrived via personal wheelchair from pre-op , pt was scheduled for stent placement by dr. Twylla, was found to have blood sugar of 394, received 15 units of novolog  at 0914, pt is a dialysis pt with TRSat treatment, last treatment was Saturday.

## 2023-12-03 NOTE — Progress Notes (Addendum)
 Patient had a blood glucose drawn in the pre op area that was 394. Patient states that her blood glucose usually runs under 150. Patient denies any symptoms such as confusion, feeling dizzy, nausea, vomiting, or shortness of breath. Husband at bed side states that her blood glucose has been trending up this last week.   Case will be canceled today after consulting the surgeon who stated that the case was not emergent/urgent. Patient given 15u of novolog  subcutaneous. Will send patient to the ED for treatment. Her case will be rescheduled.

## 2023-12-03 NOTE — ED Provider Notes (Signed)
 [facility] Provider Note    Event Date/Time   First MD Initiated Contact with Patient 12/03/23 1100     (approximate)   History   Hyperglycemia   HPI  Heather Lopez is a 78 y.o. female with a past medical history of end-stage renal disease, obesity, anxiety, hypertension who presents today for evaluation of hyperglycemia.  Patient reports that she was supposed to get her stent removed today, and has been n.p.o. per their instructions, and her blood sugar was found to be 400 and so her surgery was canceled.  Patient denies any complaints today.  She was given NovoLog  approximately 2 hours prior to arrival.  Patient has not had any nausea or vomiting.  No abdominal pain.  She reports that she feels her normal self.  Patient Active Problem List   Diagnosis Date Noted   Acute metabolic encephalopathy 09/22/2023   Subdural hematoma (HCC) 09/18/2023   AMS (altered mental status) 09/17/2023   Encephalopathy 09/17/2023   Peritonitis (HCC) 08/05/2023   ESRD on peritoneal dialysis (HCC) 07/31/2023   SBP (spontaneous bacterial peritonitis) (HCC) 07/30/2023   Pseudomonas urinary tract infection 02/20/2022   Decubitus ulcer of sacral region, stage 4 (HCC) 12/19/2021   Lactic acidosis 12/19/2021   Status post placement of ureteral stent 12/09/2021 12/19/2021   AKI (acute kidney injury) (HCC) 12/19/2021   Decubitus ulcer of sacral region, stage 1 12/13/2021   UTI (urinary tract infection) 12/02/2021   Chronic osteomyelitis of coccyx (HCC) 12/02/2021   Abdominal pain 12/02/2021   Nausea & vomiting 12/02/2021   Hyperkalemia 12/02/2021   Hyponatremia 12/02/2021   Hyperglycemia due to diabetes mellitus (HCC) 12/02/2021   Elevated alkaline phosphatase level 12/02/2021   Elevated lipase 12/02/2021   Hypoalbuminemia due to protein-calorie malnutrition (HCC) 12/02/2021   Obesity (BMI 30-39.9) 12/02/2021   Anxiety 10/10/2021   Primary localized osteoarthritis of pelvic  region and thigh 10/10/2021   Benign hypertensive kidney disease with chronic kidney disease 09/26/2021   Secondary hyperparathyroidism of renal origin (HCC) 09/26/2021   Hypokalemia 07/20/2021   Renal abscess    Essential hypertension    Lethargy    Duodenal ulcer perforation (HCC)    Demand ischemia of myocardium (HCC)    Atrial flutter with rapid ventricular response (HCC) 07/07/2021   Endotracheally intubated 07/07/2021   Bilateral nephrolithiasis 07/07/2021   Acute on chronic anemia 07/07/2021   Pressure injury of skin 06/30/2021   Pneumoperitoneum    Acute respiratory failure with hypoxia (HCC) 06/27/2021   Severe sepsis with septic shock (HCC) 06/27/2021   Acute lower UTI 06/27/2021   Acute kidney injury superimposed on CKD (HCC) 06/27/2021   Diabetes mellitus, type II (HCC) 03/03/2018   Vitamin D  deficiency 08/21/2016   IBS (irritable bowel syndrome) 08/21/2016   Special screening for malignant neoplasms, colon    Benign neoplasm of transverse colon    Benign neoplasm of sigmoid colon    T2DM (type 2 diabetes mellitus) (HCC) 07/12/2015   Hyperlipidemia associated with type 2 diabetes mellitus (HCC) 07/12/2015   Depression 07/12/2015   BMI 40.0-44.9, adult (HCC) 07/12/2015          Physical Exam   Triage Vital Signs: ED Triage Vitals  Encounter Vitals Group     BP 12/03/23 0952 (!) 192/78     Systolic BP Percentile --      Diastolic BP Percentile --      Pulse Rate 12/03/23 0952 67     Resp 12/03/23 0952 18  Temp 12/03/23 0952 98.3 F (36.8 C)     Temp Source 12/03/23 0952 Oral     SpO2 12/03/23 0952 96 %     Weight 12/03/23 0951 190 lb (86.2 kg)     Height 12/03/23 0951 5' 3 (1.6 m)     Head Circumference --      Peak Flow --      Pain Score 12/03/23 0951 0     Pain Loc --      Pain Education --      Exclude from Growth Chart --     Most recent vital signs: Vitals:   12/03/23 0952  BP: (!) 192/78  Pulse: 67  Resp: 18  Temp: 98.3 F (36.8  C)  SpO2: 96%    Physical Exam Vitals and nursing note reviewed.  Constitutional:      General: Awake and alert. No acute distress.    Appearance: Normal appearance. The patient is obese.  HENT:     Head: Normocephalic and atraumatic.     Mouth: Mucous membranes are moist.  Eyes:     General: PERRL. Normal EOMs        Right eye: No discharge.        Left eye: No discharge.     Conjunctiva/sclera: Conjunctivae normal.  Cardiovascular:     Rate and Rhythm: Normal rate and regular rhythm.     Pulses: Normal pulses.  Pulmonary:     Effort: Pulmonary effort is normal. No respiratory distress.     Breath sounds: Normal breath sounds.  Abdominal:     Abdomen is soft. There is no abdominal tenderness. No rebound or guarding. No distention. Musculoskeletal:        General: No swelling. Normal range of motion.     Cervical back: Normal range of motion and neck supple.  Skin:    General: Skin is warm and dry.     Capillary Refill: Capillary refill takes less than 2 seconds.     Findings: No rash.  Neurological:     Mental Status: The patient is awake and alert.      ED Results / Procedures / Treatments   Labs (all labs ordered are listed, but only abnormal results are displayed) Labs Reviewed  BASIC METABOLIC PANEL - Abnormal; Notable for the following components:      Result Value   Glucose, Bld 357 (*)    BUN 39 (*)    Creatinine, Ser 4.01 (*)    Calcium  8.6 (*)    GFR, Estimated 11 (*)    All other components within normal limits  CBC - Abnormal; Notable for the following components:   RBC 3.35 (*)    Hemoglobin 10.5 (*)    HCT 33.4 (*)    RDW 16.8 (*)    All other components within normal limits  CBG MONITORING, ED - Abnormal; Notable for the following components:   Glucose-Capillary 352 (*)    All other components within normal limits  CBG MONITORING, ED - Abnormal; Notable for the following components:   Glucose-Capillary 275 (*)    All other components within  normal limits  URINALYSIS, ROUTINE W REFLEX MICROSCOPIC  CBG MONITORING, ED  CBG MONITORING, ED     EKG     RADIOLOGY     PROCEDURES:  Critical Care performed:   Procedures   MEDICATIONS ORDERED IN ED: Medications - No data to display   IMPRESSION / MDM / ASSESSMENT AND PLAN / ED COURSE  I reviewed  the triage vital signs and the nursing notes.   Differential diagnosis includes, but is not limited to, hyperglycemia, HHS, DKA, electrolyte disarray, dehydration.  Patient is awake and alert, hemodynamically stable and afebrile.  She is nontoxic in appearance and has no physical complaints.  Labs obtained in triage reveal a creatinine of 4, which is her baseline.  Her blood sugar is elevated to 357, though no elevated anion gap, normal bicarb, not consistent with DKA.   I reviewed the patient's chart.  Patient was seen by Dagoberto Shines, inpatient diabetes coordinator and she was given NovoLog  15 units, and sent to the emergency department due to anticipate hypoglycemia.   It has been 2 hours since patient received NovoLog .  Sugar was rechecked and was 275.  Patient had not taken her insulin  yesterday or today.  She reports that she is well aware of how to monitor her blood sugar and feels comfortable doing this at home.  We discussed the importance of proper glucose control.  She was also noted to be hypertensive, we discussed the importance of proper hypertension control.  She reports that she did not take her antihypertensives this morning given that she was n.p.o. prior to surgery.  She denies headache, visual changes, chest pain, shortness of breath, or any other symptoms today.  We discussed strict return precautions and importance of close outpatient follow-up.  He also recommended that she reach out to Dr. Fransisca team to reschedule her surgery.  Patient understands and agrees with plan.  She was discharged in stable condition.  Patient's presentation is most consistent  with acute complicated illness / injury requiring diagnostic workup.  Case was discussed with Dr. Arlander who agrees with assessment and plan.  Clinical Course as of 12/03/23 1201  Tue Dec 03, 2023  1124 Glucose-Capillary(!): 275 Per chart review, patient generally is in the 200 range.  Patient reports that she feels very comfortable taking her blood sugar and adjusting her insulin  at home to be discharged [JP]    Clinical Course User Index [JP] Sharlette Jansma E, PA-C     FINAL CLINICAL IMPRESSION(S) / ED DIAGNOSES   Final diagnoses:  Hyperglycemia     Rx / DC Orders   ED Discharge Orders     None        Note:  This document was prepared using Dragon voice recognition software and may include unintentional dictation errors.   Ajani Schnieders E, PA-C 12/03/23 1201    Arlander [place], MD 12/03/23 [phone]

## 2023-12-03 NOTE — Discharge Instructions (Addendum)
 Remember to pay close attention to your blood sugar and take your insulin  as prescribed.  Please reach out to Dr. Fransisca office to reschedule your surgery.  Please return for any new, worsening, or change in symptoms or other concerns.  It was a pleasure caring for you today.

## 2023-12-03 NOTE — Telephone Encounter (Signed)
 I attempted to contact the patient to schedule a left brachialcephalic AV fistula with Dr. Wyn Quaker. A message was left for a return call.

## 2023-12-03 NOTE — ED Triage Notes (Signed)
 Patient to ED via POV from same day surgery for hyperglycemia. CBG was 394 and given 15u subcutaneous of novolog at 0914. Did not insulin at home today prior to surgery.

## 2023-12-05 NOTE — Telephone Encounter (Signed)
 Spoke with the patient's spouse on 12/04/23, the patient is scheduled for a  left brachial cephalic AV fistula on 01/09/24 at the [facility]. Pre-op  is son 12/27/23 at 8:00 am at the [facility]. Pre-surgical instructions were discussed and will be sent to Mychart and mailed. Spouse was offered earlier appts but declined stating the patient had a lot going on and wanted to wait.

## 2023-12-27 ENCOUNTER — Other Ambulatory Visit (INDEPENDENT_AMBULATORY_CARE_PROVIDER_SITE_OTHER): Payer: Self-pay | Admitting: Nurse Practitioner

## 2023-12-27 ENCOUNTER — Other Ambulatory Visit: Payer: Self-pay

## 2023-12-27 ENCOUNTER — Encounter
Admission: RE | Admit: 2023-12-27 | Discharge: 2023-12-27 | Disposition: A | Discharge status: Home / Self Care | Payer: Medicare Other | Source: Ambulatory Visit | Attending: Vascular Surgery | Admitting: Vascular Surgery

## 2023-12-27 VITALS — BP 166/89 | HR 66 | Temp 98.2°F | Resp 18 | Ht 63.0 in | Wt 201.2 lb

## 2023-12-27 DIAGNOSIS — N186 End stage renal disease: Secondary | ICD-10-CM | POA: Diagnosis not present

## 2023-12-27 DIAGNOSIS — Z01812 Encounter for preprocedural laboratory examination: Secondary | ICD-10-CM | POA: Insufficient documentation

## 2023-12-27 DIAGNOSIS — Z992 Dependence on renal dialysis: Secondary | ICD-10-CM | POA: Diagnosis not present

## 2023-12-27 DIAGNOSIS — E1122 Type 2 diabetes mellitus with diabetic chronic kidney disease: Secondary | ICD-10-CM | POA: Insufficient documentation

## 2023-12-27 DIAGNOSIS — Z794 Long term (current) use of insulin: Secondary | ICD-10-CM | POA: Insufficient documentation

## 2023-12-27 LAB — TYPE AND SCREEN
ABO/RH(D): O POS
Antibody Screen: NEGATIVE

## 2023-12-27 NOTE — Patient Instructions (Addendum)
Your procedure is scheduled on:  Thursday, February 13  Report to the Registration Desk on the 1st floor of the [facility] To find out your arrival time, please call [phone] between 1PM - 3PM on:  Wednesday, February 12  If your arrival time is 6:00 am, do not arrive before that time as the [facility] entrance doors do not open until 6:00 am.  REMEMBER: Instructions that are not followed completely may result in serious medical risk, up to and including death; or upon the discretion of your surgeon and anesthesiologist your surgery may need to be rescheduled.  Do not eat food after midnight the night before surgery.  No gum chewing or hard candies.  One week prior to surgery:Starting Thursday February 6  Stop Anti-inflammatories (NSAIDS) such as Advil, Aleve, Ibuprofen, Motrin, Naproxen, Naprosyn and Aspirin based products such as Excedrin, Goody's Powder, BC Powder. Stop ANY OVER THE COUNTER supplements until after surgery.  You may however, continue to take Tylenol if needed for pain up until the day of surgery.  **Follow guidelines for insulin and diabetes medications.** Instruct patient to take:   the normal dose of insulin the night prior to surgery (insulin glargine (LANTUS) 7 units at bedtime Wednesday February 12)  NO INSULIN MORNING OF SURGERY **Follow recommendations regarding stopping blood thinners.** aspirin 81 continue to take until day of surgery, last dose Wednesday February 12    Continue taking all of your other prescription medications up until the day of surgery.  ON THE DAY OF SURGERY ONLY TAKE THESE MEDICATIONS WITH SIPS OF WATER:  buPROPion (WELLBUTRIN XL)  busPIRone (BUSPAR)  metoprolol tartrate (LOPRESSOR)   No Alcohol for 24 hours before or after surgery.  No Smoking including e-cigarettes for 24 hours before surgery.  No chewable tobacco products for at least 6 hours before surgery.  No nicotine patches on the day of surgery.  Do not use  any "recreational" drugs for at least a week (preferably 2 weeks) before your surgery.  Please be advised that the combination of cocaine and anesthesia may have negative outcomes, up to and including death. If you test positive for cocaine, your surgery will be cancelled.  On the morning of surgery brush your teeth with toothpaste and water, you may rinse your mouth with mouthwash if you wish. Do not swallow any toothpaste or mouthwash.  Use CHG Soap as directed on instruction sheet.  Do not wear jewelry, make-up, hairpins, clips or nail polish.  For welded (permanent) jewelry: bracelets, anklets, waist bands, etc.  Please have this removed prior to surgery.  If it is not removed, there is a chance that hospital personnel will need to cut it off on the day of surgery.  Do not wear lotions, powders, or perfumes.   Do not shave body hair from the neck down 48 hours before surgery.  Contact lenses, hearing aids and dentures may not be worn into surgery.  Do not bring valuables to the hospital. [facility] is not responsible for any missing/lost belongings or valuables.   Notify your doctor if there is any change in your medical condition (cold, fever, infection).  Wear comfortable clothing (specific to your surgery type) to the hospital.  After surgery, you can help prevent lung complications by doing breathing exercises.  Take deep breaths and cough every 1-2 hours.   If you are being discharged the day of surgery, you will not be allowed to drive home. You will need a responsible individual to drive you  home and stay with you for 24 hours after surgery.   If you are taking public transportation, you will need to have a responsible individual with you.  Please call the Pre-admissions Testing Dept. at [phone] if you have any questions about these instructions.  Surgery Visitation Policy:  Patients having surgery or a procedure may have two visitors.  Children under the age  of 51 must have an adult with them who is not the patient.  Temporary Visitor Restrictions Due to increasing cases of flu, RSV and COVID-19: Children ages 33 and under will not be able to visit patients in [facility] hospitals under most circumstances.          Preparing for Surgery with CHLORHEXIDINE GLUCONATE (CHG) Soap  Chlorhexidine Gluconate (CHG) Soap  o An antiseptic cleaner that kills germs and bonds with the skin to continue killing germs even after washing  o Used for showering the night before surgery and morning of surgery  Before surgery, you can play an important role by reducing the number of germs on your skin.  CHG (Chlorhexidine gluconate) soap is an antiseptic cleanser which kills germs and bonds with the skin to continue killing germs even after washing.  Please do not use if you have an allergy to CHG or antibacterial soaps. If your skin becomes reddened/irritated stop using the CHG.  1. Shower the NIGHT BEFORE SURGERY and the MORNING OF SURGERY with CHG soap.  2. If you choose to wash your hair, wash your hair first as usual with your normal shampoo.  3. After shampooing, rinse your hair and body thoroughly to remove the shampoo.  4. Use CHG as you would any other liquid soap. You can apply CHG directly to the skin and wash gently with a scrungie or a clean washcloth.  5. Apply the CHG soap to your body only from the neck down. Do not use on open wounds or open sores. Avoid contact with your eyes, ears, mouth, and genitals (private parts). Wash face and genitals (private parts) with your normal soap.  6. Wash thoroughly, paying special attention to the area where your surgery will be performed.  7. Thoroughly rinse your body with warm water.  8. Do not shower/wash with your normal soap after using and rinsing off the CHG soap.  9. Pat yourself dry with a clean towel.  10. Wear clean pajamas to bed the night before surgery.  11. Place clean sheets  on your bed the night of your first shower and do not sleep with pets.  12. Shower again with the CHG soap on the day of surgery prior to arriving at the hospital.  13. Do not apply any deodorants/lotions/powders.  14. Please wear clean clothes to the hospital.

## 2024-01-09 ENCOUNTER — Other Ambulatory Visit: Payer: Self-pay

## 2024-01-09 ENCOUNTER — Ambulatory Visit: Payer: Self-pay | Admitting: Anesthesiology

## 2024-01-09 ENCOUNTER — Encounter: Payer: Self-pay | Admitting: Vascular Surgery

## 2024-01-09 ENCOUNTER — Ambulatory Visit: Payer: Medicare Other

## 2024-01-09 ENCOUNTER — Encounter
Admission: RE | Disposition: A | Discharge status: Home / Self Care | Payer: Self-pay | Source: Ambulatory Visit | Attending: Vascular Surgery

## 2024-01-09 ENCOUNTER — Ambulatory Visit
Admission: RE | Admit: 2024-01-09 | Discharge: 2024-01-09 | Disposition: A | Discharge status: Home / Self Care | Payer: Medicare Other | Source: Ambulatory Visit | Attending: Vascular Surgery | Admitting: Vascular Surgery

## 2024-01-09 DIAGNOSIS — E1122 Type 2 diabetes mellitus with diabetic chronic kidney disease: Secondary | ICD-10-CM | POA: Insufficient documentation

## 2024-01-09 DIAGNOSIS — I120 Hypertensive chronic kidney disease with stage 5 chronic kidney disease or end stage renal disease: Secondary | ICD-10-CM | POA: Diagnosis present

## 2024-01-09 DIAGNOSIS — Z01818 Encounter for other preprocedural examination: Secondary | ICD-10-CM

## 2024-01-09 DIAGNOSIS — Z01812 Encounter for preprocedural laboratory examination: Secondary | ICD-10-CM

## 2024-01-09 DIAGNOSIS — N186 End stage renal disease: Secondary | ICD-10-CM | POA: Insufficient documentation

## 2024-01-09 DIAGNOSIS — Z794 Long term (current) use of insulin: Secondary | ICD-10-CM | POA: Diagnosis not present

## 2024-01-09 DIAGNOSIS — K219 Gastro-esophageal reflux disease without esophagitis: Secondary | ICD-10-CM | POA: Diagnosis not present

## 2024-01-09 DIAGNOSIS — Z992 Dependence on renal dialysis: Secondary | ICD-10-CM | POA: Insufficient documentation

## 2024-01-09 DIAGNOSIS — Z87891 Personal history of nicotine dependence: Secondary | ICD-10-CM | POA: Diagnosis not present

## 2024-01-09 DIAGNOSIS — Z79899 Other long term (current) drug therapy: Secondary | ICD-10-CM | POA: Diagnosis not present

## 2024-01-09 HISTORY — PX: AV FISTULA PLACEMENT: SHX1204

## 2024-01-09 LAB — POCT I-STAT, CHEM 8
BUN: 25 mg/dL — ABNORMAL HIGH (ref 8–23)
Calcium, Ion: 1.16 mmol/L (ref 1.15–1.40)
Chloride: 98 mmol/L (ref 98–111)
Creatinine, Ser: 4 mg/dL — ABNORMAL HIGH (ref 0.44–1.00)
Glucose, Bld: 332 mg/dL — ABNORMAL HIGH (ref 70–99)
HCT: 41 % (ref 36.0–46.0)
Hemoglobin: 13.9 g/dL (ref 12.0–15.0)
Potassium: 4 mmol/L (ref 3.5–5.1)
Sodium: 136 mmol/L (ref 135–145)
TCO2: 25 mmol/L (ref 22–32)

## 2024-01-09 LAB — GLUCOSE, CAPILLARY
Glucose-Capillary: 299 mg/dL — ABNORMAL HIGH (ref 70–99)
Glucose-Capillary: 241 mg/dL — ABNORMAL HIGH (ref 70–99)

## 2024-01-09 SURGERY — ARTERIOVENOUS (AV) FISTULA CREATION
Anesthesia: General | Laterality: Left

## 2024-01-09 MED ORDER — ONDANSETRON HCL 4 MG/2ML IJ SOLN: 4.0000 mg | Freq: Four times a day (QID) | INTRAMUSCULAR | Status: DC | PRN

## 2024-01-09 MED ORDER — BUPIVACAINE HCL (PF) 0.5 % IJ SOLN
INTRAMUSCULAR | Status: DC | PRN
  Administered 2024-01-09: 10 mL

## 2024-01-09 MED ORDER — INSULIN ASPART 100 UNIT/ML IJ SOLN
INTRAMUSCULAR | Status: AC
  Filled 2024-01-09: qty 1

## 2024-01-09 MED ORDER — OXYCODONE HCL 5 MG/5ML PO SOLN: 5.0000 mg | Freq: Once | ORAL | Status: DC | PRN

## 2024-01-09 MED ORDER — FIBRIN SEALANT 2 ML SINGLE DOSE KIT
PACK | CUTANEOUS | Status: DC | PRN
  Administered 2024-01-09: 2 mL via TOPICAL

## 2024-01-09 MED ORDER — SODIUM CHLORIDE 0.9 % IV SOLN: INTRAVENOUS | Status: DC

## 2024-01-09 MED ORDER — LIDOCAINE HCL (PF) 2 % IJ SOLN
INTRAMUSCULAR | Status: DC | PRN
Start: 2024-01-09 — End: 2024-01-09
  Administered 2024-01-09: 50 mg via INTRADERMAL

## 2024-01-09 MED ORDER — HEPARIN SODIUM (PORCINE) 1000 UNIT/ML IJ SOLN
INTRAMUSCULAR | Status: DC | PRN
  Administered 2024-01-09: 3000 [IU] via INTRAVENOUS

## 2024-01-09 MED ORDER — LIDOCAINE HCL (PF) 2 % IJ SOLN
INTRAMUSCULAR | Status: AC
Start: 2024-01-09 — End: ?
  Filled 2024-01-09: qty 5

## 2024-01-09 MED ORDER — SODIUM CHLORIDE 0.9 % IV SOLN: INTRAVENOUS | Status: DC | PRN

## 2024-01-09 MED ORDER — ORAL CARE MOUTH RINSE: 15.0000 mL | Freq: Once | OROMUCOSAL | Status: AC

## 2024-01-09 MED ORDER — PAPAVERINE HCL 30 MG/ML IJ SOLN
INTRAMUSCULAR | Status: AC
  Filled 2024-01-09: qty 2

## 2024-01-09 MED ORDER — LIDOCAINE HCL (PF) 2 % IJ SOLN
INTRAMUSCULAR | Status: DC | PRN
  Administered 2024-01-09: 10 mL

## 2024-01-09 MED ORDER — HYDROMORPHONE HCL 1 MG/ML IJ SOLN: 1.0000 mg | Freq: Once | INTRAMUSCULAR | Status: DC | PRN

## 2024-01-09 MED ORDER — MIDAZOLAM HCL 2 MG/2ML IJ SOLN
INTRAMUSCULAR | Status: AC
  Filled 2024-01-09: qty 2

## 2024-01-09 MED ORDER — INSULIN ASPART 100 UNIT/ML IJ SOLN
10.0000 [IU] | Freq: Once | INTRAMUSCULAR | Status: AC
  Administered 2024-01-09: 10 [IU] via SUBCUTANEOUS

## 2024-01-09 MED ORDER — BUPIVACAINE-EPINEPHRINE (PF) 0.5% -1:200000 IJ SOLN
INTRAMUSCULAR | Status: AC
  Filled 2024-01-09: qty 30

## 2024-01-09 MED ORDER — HEPARIN SODIUM (PORCINE) 1000 UNIT/ML IJ SOLN
INTRAMUSCULAR | Status: AC
  Filled 2024-01-09: qty 10

## 2024-01-09 MED ORDER — PROPOFOL 1000 MG/100ML IV EMUL
INTRAVENOUS | Status: AC
  Filled 2024-01-09: qty 100

## 2024-01-09 MED ORDER — OXYCODONE HCL 5 MG PO TABS: 5.0000 mg | ORAL_TABLET | Freq: Once | ORAL | Status: DC | PRN

## 2024-01-09 MED ORDER — CEFAZOLIN SODIUM-DEXTROSE 2-4 GM/100ML-% IV SOLN
INTRAVENOUS | Status: AC
  Filled 2024-01-09: qty 100

## 2024-01-09 MED ORDER — BUPIVACAINE HCL (PF) 0.5 % IJ SOLN
INTRAMUSCULAR | Status: AC
  Filled 2024-01-09: qty 20

## 2024-01-09 MED ORDER — PROPOFOL 10 MG/ML IV BOLUS
INTRAVENOUS | Status: DC | PRN
  Administered 2024-01-09: 50 mg via INTRAVENOUS

## 2024-01-09 MED ORDER — FENTANYL CITRATE (PF) 100 MCG/2ML IJ SOLN
INTRAMUSCULAR | Status: AC
  Filled 2024-01-09: qty 2

## 2024-01-09 MED ORDER — HEPARIN SODIUM (PORCINE) 5000 UNIT/ML IJ SOLN
INTRAMUSCULAR | Status: AC
  Filled 2024-01-09: qty 1

## 2024-01-09 MED ORDER — PROPOFOL 10 MG/ML IV BOLUS
INTRAVENOUS | Status: AC
  Filled 2024-01-09: qty 20

## 2024-01-09 MED ORDER — CHLORHEXIDINE GLUCONATE 0.12 % MT SOLN
OROMUCOSAL | Status: AC
  Filled 2024-01-09: qty 15

## 2024-01-09 MED ORDER — ACETAMINOPHEN 10 MG/ML IV SOLN: 1000.0000 mg | Freq: Once | INTRAVENOUS | Status: DC | PRN

## 2024-01-09 MED ORDER — CHLORHEXIDINE GLUCONATE 0.12 % MT SOLN
15.0000 mL | Freq: Once | OROMUCOSAL | Status: AC
  Administered 2024-01-09: 15 mL via OROMUCOSAL

## 2024-01-09 MED ORDER — FENTANYL CITRATE (PF) 100 MCG/2ML IJ SOLN: 25.0000 ug | INTRAMUSCULAR | Status: DC | PRN

## 2024-01-09 MED ORDER — TRAMADOL HCL 50 MG PO TABS
50.0000 mg | ORAL_TABLET | Freq: Four times a day (QID) | ORAL | 0 refills | Status: DC | PRN
Start: 2024-01-09 — End: 2024-06-17

## 2024-01-09 MED ORDER — FIBRIN SEALANT 2 ML SINGLE DOSE KIT
PACK | CUTANEOUS | Status: AC
  Filled 2024-01-09: qty 2

## 2024-01-09 MED ORDER — ONDANSETRON HCL 4 MG/2ML IJ SOLN: 4.0000 mg | Freq: Once | INTRAMUSCULAR | Status: DC | PRN

## 2024-01-09 MED ORDER — PROPOFOL 500 MG/50ML IV EMUL
INTRAVENOUS | Status: DC | PRN
  Administered 2024-01-09: 75 ug/kg/min via INTRAVENOUS

## 2024-01-09 MED ORDER — CHLORHEXIDINE GLUCONATE CLOTH 2 % EX PADS: 6.0000 | MEDICATED_PAD | Freq: Once | CUTANEOUS | Status: DC

## 2024-01-09 MED ORDER — CEFAZOLIN SODIUM-DEXTROSE 2-4 GM/100ML-% IV SOLN
2.0000 g | INTRAVENOUS | Status: AC
  Administered 2024-01-09: 2 g via INTRAVENOUS

## 2024-01-09 MED ORDER — CHLORHEXIDINE GLUCONATE CLOTH 2 % EX PADS
6.0000 | MEDICATED_PAD | Freq: Once | CUTANEOUS | Status: AC
  Administered 2024-01-09: 6 via TOPICAL

## 2024-01-09 SURGICAL SUPPLY — 43 items
BAG DECANTER FOR FLEXI CONT (MISCELLANEOUS) ×1 IMPLANT
BLADE SURG SZ11 CARB STEEL (BLADE) ×1 IMPLANT
BRUSH SCRUB EZ 4% CHG (MISCELLANEOUS) ×1 IMPLANT
CHLORAPREP W/TINT 26 (MISCELLANEOUS) ×1 IMPLANT
CLAMP SUTURE YELLOW 5 PAIRS (MISCELLANEOUS) ×1 IMPLANT
CLIP SPRNG 6 S-JAW DBL (CLIP) ×1 IMPLANT
CLIP SPRNG 6MM S-JAW DBL (CLIP) ×1
DERMABOND ADVANCED .7 DNX12 (GAUZE/BANDAGES/DRESSINGS) ×1 IMPLANT
DRESSING SURGICEL FIBRLLR 1X2 (HEMOSTASIS) ×1 IMPLANT
DRSG SURGICEL FIBRILLAR 1X2 (HEMOSTASIS)
ELECT CAUTERY BLADE 6.4 (BLADE) ×1 IMPLANT
ELECT REM PT RETURN 9FT ADLT (ELECTROSURGICAL) ×1
ELECTRODE REM PT RTRN 9FT ADLT (ELECTROSURGICAL) ×1 IMPLANT
GEL ULTRASOUND 20GR AQUASONIC (MISCELLANEOUS) IMPLANT
GLOVE BIO SURGEON STRL SZ7 (GLOVE) ×2 IMPLANT
GOWN STRL REUS W/ TWL LRG LVL3 (GOWN DISPOSABLE) ×2 IMPLANT
GOWN STRL REUS W/TWL 2XL LVL3 (GOWN DISPOSABLE) ×2 IMPLANT
IV NS 500ML BAXH (IV SOLUTION) ×1 IMPLANT
KIT TURNOVER KIT A (KITS) ×1 IMPLANT
LABEL OR SOLS (LABEL) ×1 IMPLANT
LOOP VESSEL MAXI 1X406 RED (MISCELLANEOUS) ×1 IMPLANT
LOOP VESSEL MINI 0.8X406 BLUE (MISCELLANEOUS) ×1 IMPLANT
MANIFOLD NEPTUNE II (INSTRUMENTS) ×1 IMPLANT
NDL FILTER BLUNT 18X1 1/2 (NEEDLE) ×1 IMPLANT
NEEDLE FILTER BLUNT 18X1 1/2 (NEEDLE) ×1
NS IRRIG 500ML POUR BTL (IV SOLUTION) ×1 IMPLANT
PACK EXTREMITY ARMC (MISCELLANEOUS) ×1 IMPLANT
PAD PREP OB/GYN DISP 24X41 (PERSONAL CARE ITEMS) ×1 IMPLANT
SLING ARM M TX990204 (SOFTGOODS) IMPLANT
STOCKINETTE 48X4 2 PLY STRL (GAUZE/BANDAGES/DRESSINGS) ×1 IMPLANT
STOCKINETTE STRL 4IN 9604848 (GAUZE/BANDAGES/DRESSINGS) ×1
SUT MNCRL AB 4-0 PS2 18 (SUTURE) ×1 IMPLANT
SUT PROLENE 6 0 BV (SUTURE) ×4 IMPLANT
SUT SILK 2-0 18XBRD TIE 12 (SUTURE) ×1 IMPLANT
SUT SILK 3-0 18XBRD TIE 12 (SUTURE) ×1 IMPLANT
SUT SILK 4-0 18XBRD TIE 12 (SUTURE) ×1 IMPLANT
SUT VIC AB 3-0 SH 27X BRD (SUTURE) ×1 IMPLANT
SYR 20ML LL LF (SYRINGE) ×1 IMPLANT
SYR 3ML LL SCALE MARK (SYRINGE) ×1 IMPLANT
SYR TB 1ML LL NO SAFETY (SYRINGE) IMPLANT
TAG SUTURE CLAMP YLW 5PR (MISCELLANEOUS) ×1
TRAP FLUID SMOKE EVACUATOR (MISCELLANEOUS) ×1 IMPLANT
WATER STERILE IRR 500ML POUR (IV SOLUTION) ×1 IMPLANT

## 2024-01-09 NOTE — Transfer of Care (Signed)
Immediate Anesthesia Transfer of Care Note  Patient: Heather Lopez  Procedure(s) Performed: ARTERIOVENOUS (AV) FISTULA CREATION (BRACHIAL CEPHALIC) (Left)  Patient Location: PACU  Anesthesia Type:General and Regional  Level of Consciousness: drowsy  Airway & Oxygen Therapy: Patient Spontanous Breathing and Patient connected to face mask oxygen  Post-op Assessment: Report given to RN  Post vital signs: stable  Last Vitals:  Vitals Value Taken Time  BP 147/88 01/09/24 0913  Temp    Pulse 69 01/09/24 0914  Resp 21 01/09/24 0914  SpO2 94 % 01/09/24 0914  Vitals shown include unfiled device data.  Last Pain:  Vitals:   01/09/24 0624  TempSrc: Temporal  PainSc: 0-No pain         Complications: No notable events documented.

## 2024-01-09 NOTE — Anesthesia Procedure Notes (Signed)
Anesthesia Regional Block: Supraclavicular block  ? ?Pre-Anesthetic Checklist: , timeout performed,  Correct Patient, Correct Site, Correct Laterality,  Correct Procedure, Correct Position, site marked,  Risks and benefits discussed,  Surgical consent,  Pre-op evaluation,  At surgeon's request and post-op pain management ? ?Laterality: Left ? ?Prep: chloraprep     ?  ?Needles:  ?Injection technique: Single-shot ? ?Needle Type: Echogenic Needle   ? ? ?Needle Length: 4cm  ?Needle Gauge: 25  ? ? ? ?Additional Needles: ? ? ?Procedures:,,,, ultrasound used (permanent image in chart),,    ?Narrative:  ?Injection made incrementally with aspirations every 5 mL. ? ?Performed by: Personally  ?Anesthesiologist: Corinda Gubler, MD ? ?Additional Notes: ?Patient's chart reviewed and they were deemed appropriate candidate for procedure, per surgeon's request. Patient educated about risks, benefits, and alternatives of the block including but not limited to: temporary or permanent nerve damage, hemidiaphragmatic paralysis leading to dyspnea, pneumothorax, bleeding, infection, damage to surround tissues, block failure, local anesthetic toxicity. Patient expressed understanding. A formal time-out was conducted consistent with institution rules. ? ?Monitors were applied, and minimal sedation used (see nursing record). The site was prepped with skin prep and allowed to dry, and sterile gloves were used. A high frequency linear ultrasound probe with probe cover was utilized throughout. Supraclavicular artery visualized, along with the brachial plexus adjacent to it and appeared anatomically normal. Local anesthetic injected around the plexus, and echogenic block needle trajectory was monitored throughout. Aspiration performed every 5ml. Lung and blood vessels were avoided. All injections were performed without resistance and free of blood and paresthesias. The patient tolerated the procedure well. ? ?Injectate: 10ml 0.5% bupivacaine + 10ml  2% lidocaine. ? ? ? ? ?

## 2024-01-09 NOTE — Anesthesia Preprocedure Evaluation (Signed)
Anesthesia Evaluation  Patient identified by MRN, date of birth, ID band Patient awake    Reviewed: Allergy & Precautions, NPO status , Patient's Chart, lab work & pertinent test results  History of Anesthesia Complications Negative for: history of anesthetic complications  Airway Mallampati: III  TM Distance: <3 FB Neck ROM: Full    Dental no notable dental hx. (+) Teeth Intact   Pulmonary neg pulmonary ROS, neg sleep apnea, neg COPD, Patient abstained from smoking.Not current smoker, former smoker   Pulmonary exam normal breath sounds clear to auscultation       Cardiovascular Exercise Tolerance: Good METShypertension, Pt. on medications (-) CAD and (-) Past MI (-) dysrhythmias  Rhythm:Regular Rate:Normal - Systolic murmurs TTE 2022: 1. Left ventricular ejection fraction, by estimation, is 55 to 60%. The  left ventricle has normal function. Left ventricular endocardial border  not optimally defined to evaluate regional wall motion. There is mild left  ventricular hypertrophy. Left  ventricular diastolic parameters were normal.   2. Right ventricular systolic function was not well visualized. The right  ventricular size is normal. Tricuspid regurgitation signal is inadequate  for assessing PA pressure.   3. Left atrial size was moderately dilated.   4. The mitral valve is abnormal. No evidence of mitral valve  regurgitation. No evidence of mitral stenosis.   5. The aortic valve is tricuspid. There is mild calcification of the  aortic valve. There is mild thickening of the aortic valve. Aortic valve  regurgitation is not visualized. Mild to moderate aortic valve  sclerosis/calcification is present, without any  evidence of aortic stenosis.     Neuro/Psych  PSYCHIATRIC DISORDERS Anxiety Depression    negative neurological ROS     GI/Hepatic PUD,GERD  Medicated and Controlled,,(+)     (-) substance abuse    Endo/Other   diabetes, Poorly Controlled, Insulin Dependent  Glucose elevated today, given 10u insulin  Renal/GU Dialysis and ESRFRenal diseaseLast dialyzed yesterday. Potassium WNL today     Musculoskeletal  (+) Arthritis ,    Abdominal  (+) + obese  Peds  Hematology   Anesthesia Other Findings Past Medical History: 06/27/2021: Acute renal failure (HCC) No date: Anemia in chronic kidney disease No date: Anxiety No date: Aortic atherosclerosis (HCC) No date: Cataract No date: Depression No date: Dysrhythmia No date: ESRD on dialysis [facility]) No date: GERD (gastroesophageal reflux disease) No date: Gout No date: History of kidney stones No date: Hyperlipidemia No date: Hypertension, essential 12/02/2021: Nausea & vomiting No date: Obesity 06/2021: Obstruction of left ureteropelvic junction (UPJ) due to stone No date: Osteoarthritis 06/27/2021: Perforated duodenal ulcer (HCC) 06/2021: Pyonephrosis     Comment:  left 06/27/2021: Septic shock (HCC) No date: Spontaneous bacterial peritonitis (HCC) 08/2023: Subdural hematoma (HCC) No date: Type 2 diabetes mellitus with stage 5 chronic kidney disease  (HCC) No date: Vertigo  Reproductive/Obstetrics                             Anesthesia Physical Anesthesia Plan  ASA: 4  Anesthesia Plan: General   Post-op Pain Management: Regional block* and Ofirmev IV (intra-op)*   Induction: Intravenous  PONV Risk Score and Plan: 3 and Propofol infusion, TIVA, Ondansetron, Treatment may vary due to age or medical condition and Dexamethasone  Airway Management Planned: Nasal Cannula and Natural Airway  Additional Equipment: None  Intra-op Plan:   Post-operative Plan:   Informed Consent: I have reviewed the patients History and Physical, chart,  labs and discussed the procedure including the risks, benefits and alternatives for the proposed anesthesia with the patient or authorized representative who has indicated  his/her understanding and acceptance.     Dental advisory given  Plan Discussed with: CRNA and Surgeon  Anesthesia Plan Comments: (Discussed r/b/a of supraclavicular nerve block, including:  - bleeding, infection, nerve damage - pneumothorax - shortness of breath from hemidiaphragmatic paralysis due to phrenic nerve blockade - poor or non functioning block. - reactions and toxicity to local anesthetic Patient understands. Discussed risks of anesthesia with patient, including possibility of difficulty with spontaneous ventilation under anesthesia necessitating airway intervention, PONV, and rare risks such as cardiac or respiratory or neurological events, and allergic reactions. Discussed the role of CRNA in patient's perioperative care. Patient understands.)       Anesthesia Quick Evaluation

## 2024-01-09 NOTE — Op Note (Signed)
[facility]   OPERATIVE NOTE   PROCEDURE: Left brachiocephalic arteriovenous fistula placement  PRE-OPERATIVE DIAGNOSIS: 1.  ESRD        POST-OPERATIVE DIAGNOSIS: 1. ESRD       SURGEON: Festus Barren, MD  ASSISTANT(S): none  ANESTHESIA: general  ESTIMATED BLOOD LOSS: 15 cc  FINDING(S): Adequate cephalic vein for fistula creation  SPECIMEN(S):  none  INDICATIONS:   Heather Lopez is a 78 y.o. female who presents with renal failure in need of pemanent dialysis acces.  The patient is scheduled for left arm AVF placement.  The patient is aware the risks include but are not limited to: bleeding, infection, steal syndrome, nerve damage, ischemic monomelic neuropathy, failure to mature, and need for additional procedures.  The patient is aware of the risks of the procedure and elects to proceed forward.  DESCRIPTION: After full informed written consent was obtained from the patient, the patient was brought back to the operating room and placed supine upon the operating table.  Prior to induction, the patient received IV antibiotics.   After obtaining adequate anesthesia, the patient was then prepped and draped in the standard fashion for a left arm access procedure.  I made a curvilinear incision at the level of the antecubital fossa and dissected through the subcutaneous tissue and fascia to gain exposure of the brachial artery.  This was noted to be patent and adequate in size for fistula creation.  This was dissected out proximally and distally and prepared for control with vessel loops .  I then dissected out the cephalic vein.  This was noted to be patent and adequate in size for fistula creation.  I then gave the patient 3000 units of intravenous heparin.  The vein was marked for orientation and the distal segment of the vein was ligated with a  2-0 silk, and the vein was transected.  I then instilled the heparinized saline into the vein and clamped it.  At this point, I  reset my exposure of the brachial artery and pulled up control on the vessel loops.  I made an arteriotomy with a #11 blade, and then I extended the arteriotomy with a Potts scissor.  I injected heparinized saline proximal and distal to this arteriotomy.  The vein was then sewn to the artery in an end-to-side configuration with a running stitch of 6-0 Prolene.  Prior to completing this anastomosis, I allowed the vein and artery to backbleed.  There was no evidence of clot from any vessels.  I completed the anastomosis in the usual fashion and then released all vessel loops and clamps.  There was a palpable  thrill in the venous outflow, and there was a palpable pulse in the artery distal to the anastomosis.  At this point, I irrigated out the surgical wound.  Surgicel was placed. There was no further active bleeding.  The subcutaneous tissue was reapproximated with a running stitch of 3-0 Vicryl.  The skin was then closed with a 4-0 Monocryl suture.  The skin was then cleaned, dried, and reinforced with Dermabond.  The patient tolerated this procedure well and was taken to the recovery room in stable condition  COMPLICATIONS: None  CONDITION: Stable   Festus Barren    01/09/2024, 9:29 AM  This note was created with Dragon Medical transcription system. Any errors in dictation are purely unintentional.

## 2024-01-09 NOTE — H&P (Signed)
[facility] Admission History & Physical  MRN : [id number]  Heather Lopez is a 78 y.o. (September 28, 1946) female who presents with chief complaint of No chief complaint on file. [name]  History of Present Illness: Patient presents for left arm access placements.  No new complaints from office visit last month.  No fever or chills. Left arm AVF planned.  Current Facility-Administered Medications  Medication Dose Route Frequency Provider Last Rate Last Admin   0.9 %  sodium chloride infusion   Intravenous Continuous Piscitello, Cleda Mccreedy, MD       ceFAZolin (ANCEF) IVPB 2g/100 mL premix  2 g Intravenous On Call to OR Georgiana Spinner, NP       Chlorhexidine Gluconate Cloth 2 % PADS 6 each  6 each Topical Once Georgiana Spinner, NP        Past Medical History:  Diagnosis Date   Acute renal failure (HCC) 06/27/2021   Anemia in chronic kidney disease    Anxiety    Aortic atherosclerosis (HCC)    Cataract    Depression    Dysrhythmia    ESRD on dialysis (HCC)    GERD (gastroesophageal reflux disease)    Gout    History of kidney stones    Hyperlipidemia    Hypertension, essential    Nausea & vomiting 12/02/2021   Obesity    Obstruction of left ureteropelvic junction (UPJ) due to stone 06/2021   Osteoarthritis    Perforated duodenal ulcer (HCC) 06/27/2021   Pyonephrosis 06/2021   left   Septic shock (HCC) 06/27/2021   Spontaneous bacterial peritonitis (HCC)    Subdural hematoma (HCC) 08/2023   Type 2 diabetes mellitus with stage 5 chronic kidney disease (HCC)    Vertigo     Past Surgical History:  Procedure Laterality Date   CAPD REMOVAL N/A 08/02/2023   Procedure: CONTINUOUS AMBULATORY PERITONEAL DIALYSIS  (CAPD) CATHETER REMOVAL;  Surgeon: Leafy Ro, MD;  Location: [facility];  Service: General;  Laterality: N/A;   CATARACT EXTRACTION, BILATERAL Bilateral 2019   COLONOSCOPY WITH PROPOFOL N/A 08/19/2015   Procedure: COLONOSCOPY WITH PROPOFOL;  Surgeon: Midge Minium, MD;  Location: [facility];  Service: Endoscopy;  Laterality: N/A;  diabetic - insulin   CYSTOSCOPY W/ RETROGRADES Bilateral 12/22/2019   Procedure: CYSTOSCOPY WITH RETROGRADE PYELOGRAM;  Surgeon: Riki Altes, MD;  Location: [facility];  Service: Urology;  Laterality: Bilateral;   CYSTOSCOPY W/ URETERAL STENT PLACEMENT Bilateral 07/05/2021   Procedure: CYSTOSCOPY WITH RETROGRADE PYELOGRAM/BILATERAL URETERAL STENT PLACEMENT;  Surgeon: Riki Altes, MD;  Location: [facility];  Service: Urology;  Laterality: Bilateral;   CYSTOSCOPY W/ URETERAL STENT PLACEMENT Bilateral 12/26/2021   Procedure: CYSTOSCOPY WITH STENT REMOVAL;  Surgeon: Riki Altes, MD;  Location: [facility];  Service: Urology;  Laterality: Bilateral;   CYSTOSCOPY W/ URETERAL STENT PLACEMENT Right 03/13/2022   Procedure: CYSTOSCOPY WITH RETROGRADE PYELOGRAM/URETERAL STENT PLACEMENT;  Surgeon: Riki Altes, MD;  Location: [facility];  Service: Urology;  Laterality: Right;   CYSTOSCOPY W/ URETERAL STENT PLACEMENT Right 01/01/2023   Procedure: CYSTOSCOPY WITH STENT EXCHANGE;  Surgeon: Riki Altes, MD;  Location: [facility];  Service: Urology;  Laterality: Right;   CYSTOSCOPY W/ URETERAL STENT REMOVAL Right 05/08/2022   Procedure: CYSTOSCOPY WITH STENT REMOVAL;  Surgeon: Riki Altes, MD;  Location: [facility];  Service: Urology;  Laterality: Right;   CYSTOSCOPY WITH BIOPSY N/A 12/22/2019   Procedure: CYSTOSCOPY WITH bladder BIOPSY;  Surgeon: Irineo Axon  C, MD;  Location: [facility];  Service: Urology;  Laterality: N/A;   CYSTOSCOPY WITH BIOPSY  03/13/2022   Procedure: CYSTOSCOPY WITH BLADDER BIOPSY;  Surgeon: Riki Altes, MD;  Location: [facility];  Service: Urology;;   CYSTOSCOPY WITH STENT PLACEMENT Right 12/22/2019   Procedure: CYSTOSCOPY WITH STENT PLACEMENT;  Surgeon: Riki Altes, MD;  Location: [facility];  Service: Urology;  Laterality: Right;   CYSTOSCOPY WITH STENT PLACEMENT Right 07/03/2022    Procedure: CYSTOSCOPY/RIGHT URETEROSCOPY WITH STENT PLACEMENT;  Surgeon: Riki Altes, MD;  Location: [facility];  Service: Urology;  Laterality: Right;   CYSTOSCOPY WITH URETEROSCOPY Bilateral 12/22/2019   Procedure: CYSTOSCOPY WITH URETEROSCOPY;  Surgeon: Riki Altes, MD;  Location: [facility];  Service: Urology;  Laterality: Bilateral;   CYSTOSCOPY/URETEROSCOPY/HOLMIUM LASER/STENT PLACEMENT Right 12/22/2019   Procedure: CYSTOSCOPY/URETEROSCOPY/HOLMIUM LASER/STENT PLACEMENT;  Surgeon: Riki Altes, MD;  Location: [facility];  Service: Urology;  Laterality: Right;   CYSTOSCOPY/URETEROSCOPY/HOLMIUM LASER/STENT PLACEMENT Bilateral 12/08/2021   Procedure: CYSTOSCOPY/URETEROSCOPY/HOLMIUM LASER/STENT PLACEMENT;  Surgeon: Sondra Come, MD;  Location: [facility];  Service: Urology;  Laterality: Bilateral;   DIALYSIS/PERMA CATHETER INSERTION N/A 06/29/2021   Procedure: DIALYSIS/PERMA CATHETER INSERTION;  Surgeon: Annice Needy, MD;  Location: [facility];  Service: Cardiovascular;  Laterality: N/A;   DIALYSIS/PERMA CATHETER INSERTION N/A 07/13/2021   Procedure: DIALYSIS/PERMA CATHETER INSERTION;  Surgeon: Annice Needy, MD;  Location: [facility];  Service: Cardiovascular;  Laterality: N/A;   DIALYSIS/PERMA CATHETER INSERTION N/A 08/01/2023   Procedure: DIALYSIS/PERMA CATHETER INSERTION;  Surgeon: Annice Needy, MD;  Location: [facility];  Service: Cardiovascular;  Laterality: N/A;   EYE SURGERY     INCISIONAL HERNIA REPAIR  06/28/2023   IR GASTROSTOMY TUBE MOD SED  08/01/2021   LAPAROTOMY N/A 06/29/2021   Procedure: EXPLORATORY LAPAROTOMY WITH REPAIR OF DUODENAL PERFORATION;  Surgeon: Henrene Dodge, MD;  Location: [facility];  Service: General;  Laterality: N/A;   POLYPECTOMY  08/19/2015   Procedure: POLYPECTOMY;  Surgeon: Midge Minium, MD;  Location: [facility];  Service: Endoscopy;;   TUBAL LIGATION  1992     Social History   Tobacco Use   Smoking  status: Former    Current packs/day: 0.00    Average packs/day: 0.8 packs/day for 6.0 years (4.5 ttl pk-yrs)    Types: Cigarettes    Start date: 62    Quit date: 68    Years since quitting: 52.1   Smokeless tobacco: Never   Tobacco comments:    quit 1973  Vaping Use   Vaping status: Never Used  Substance Use Topics   Alcohol use: No    Alcohol/week: 0.0 standard drinks of alcohol   Drug use: No     Family History  Problem Relation Age of Onset   Diabetes Father    Cancer Father        lung cancer   Diabetes Brother    Stroke Mother     Allergies  Allergen Reactions   Contrast Media  [Iodinated Contrast Media] Anaphylaxis   Iodine Swelling    (IV only) - angioedema     REVIEW OF SYSTEMS (Negative unless checked)  Constitutional: [] Weight loss  [] Fever  [] Chills Cardiac: [] Chest pain   [] Chest pressure   [x] Palpitations   [] Shortness of breath when laying flat   [] Shortness of breath at rest   [] Shortness of breath with exertion. Vascular:  [] Pain in legs with walking   [] Pain in legs at rest   []   Pain in legs when laying flat   [] Claudication   [] Pain in feet when walking  [] Pain in feet at rest  [] Pain in feet when laying flat   [] History of DVT   [] Phlebitis   [] Swelling in legs   [] Varicose veins   [] Non-healing ulcers Pulmonary:   [] Uses home oxygen   [] Productive cough   [] Hemoptysis   [] Wheeze  [] COPD   [] Asthma Neurologic:  [] Dizziness  [] Blackouts   [] Seizures   [] History of stroke   [] History of TIA  [] Aphasia   [] Temporary blindness   [] Dysphagia   [] Weakness or numbness in arms   [] Weakness or numbness in legs Musculoskeletal:  [x] Arthritis   [] Joint swelling   [] Joint pain   [] Low back pain Hematologic:  [] Easy bruising  [] Easy bleeding   [] Hypercoagulable state   [x] Anemic  [] Hepatitis Gastrointestinal:  [] Blood in stool   [] Vomiting blood  [x] Gastroesophageal reflux/heartburn   [] Difficulty swallowing. Genitourinary:  [x] Chronic kidney disease    [] Difficult urination  [] Frequent urination  [] Burning with urination   [] Blood in urine Skin:  [] Rashes   [] Ulcers   [] Wounds Psychological:  [x] History of anxiety   []  History of major depression.  Physical Examination  Vitals:   01/09/24 0624  BP: (!) 171/84  Pulse: 66  Resp: 16  Temp: (!) 97 F (36.1 C)  TempSrc: Temporal  SpO2: 98%   There is no height or weight on file to calculate BMI. Gen: WD/WN, NAD Head: [place]/AT, No temporalis wasting.  Ear/Nose/Throat: Hearing grossly intact, nares w/o erythema or drainage, oropharynx w/o Erythema/Exudate,  Eyes: Conjunctiva clear, sclera non-icteric Neck: Trachea midline.  No JVD.  Pulmonary:  Good air movement, respirations not labored, no use of accessory muscles.  Cardiac: RRR, normal S1, S2. Vascular: permcath in place in right chest Vessel Right Left  Radial Palpable Palpable               Musculoskeletal: M/S 5/5 throughout.  Extremities without ischemic changes.  No deformity or atrophy.  Neurologic: Sensation grossly intact in extremities.  Symmetrical.  Speech is fluent. Motor exam as listed above. Psychiatric: Judgment intact, Mood & affect appropriate for pt's clinical situation. Dermatologic: No rashes or ulcers noted.  No cellulitis or open wounds. Lymph : No Cervical, Axillary, or Inguinal lymphadenopathy.     CBC Lab Results  Component Value Date   WBC 5.9 12/03/2023   HGB 13.9 01/09/2024   HCT 41.0 01/09/2024   MCV 99.7 12/03/2023   PLT 232 12/03/2023    BMET    Component Value Date/Time   NA 136 01/09/2024 0629   NA 138 10/03/2022 1350   NA 133 (L) 01/29/2014 0406   K 4.0 01/09/2024 0629   K 4.0 01/29/2014 0406   CL 98 01/09/2024 0629   CL 96 (L) 01/29/2014 0406   CO2 22 12/03/2023 0954   CO2 29 01/29/2014 0406   GLUCOSE 332 (H) 01/09/2024 0629   GLUCOSE 377 (H) 01/29/2014 0406   BUN 25 (H) 01/09/2024 0629   BUN 39 (H) 10/03/2022 1350   BUN 31 (H) 01/29/2014 0406   CREATININE 4.00 (H)  01/09/2024 0629   CREATININE 1.17 (H) 03/17/2018 0830   CALCIUM 8.6 (L) 12/03/2023 0954   CALCIUM 9.7 01/29/2014 0406   GFRNONAA 11 (L) 12/03/2023 0954   GFRNONAA 48 (L) 08/21/2016 0908   GFRAA 29 (L) 12/22/2019 0732   GFRAA 55 (L) 08/21/2016 0908   Estimated Creatinine Clearance: 12.8 mL/min (A) (by C-G formula based on SCr of  4 mg/dL (H)).  COAG Lab Results  Component Value Date   INR 1.2 07/16/2021   INR 1.4 (H) 06/27/2021    Radiology [place] OR NERVE BLOCK-IMAGE ONLY [facility]) Result Date: 01/09/2024 There is no interpretation for this exam.  This order is for images obtained during a surgical procedure.  Please See "Surgeries" Tab for more information regarding the procedure.     Assessment/Plan 1. ESRD on dialysis [facility]) (Primary) Recommend:   At this time the patient does not have appropriate extremity access for dialysis   Patient should have a left brachiocephalic AV fistula created, however if vein diameters are found to be an adequate a break ax graft should be inserted.   The risks, benefits and alternative therapies were reviewed in detail with the patient.  All questions were answered.  The patient agrees to proceed with surgery.    The patient will follow up with me in the office after the surgery.   2. Essential hypertension Continue antihypertensive medications as already ordered, these medications have been reviewed and there are no changes at this time.   3. Type 2 diabetes mellitus with chronic kidney disease on chronic dialysis, with long-term current use of insulin (HCC) Continue hypoglycemic medications as already ordered, these medications have been reviewed and there are no changes at this time.   Hgb A1C to be monitored as already arranged by primary servic   Festus Barren, MD  01/09/2024 7:25 AM

## 2024-01-09 NOTE — Discharge Instructions (Signed)
Interscalene Nerve Block (ISNB) Discharge Instructions    For your surgery you have received an Interscalene Nerve Block. Nerve Blocks affect many types of nerves, including nerves that control movement, pain and normal sensation.  You may experience feelings such as numbness, tingling, heaviness, weakness or the inability to move your arm or the feeling or sensation that your arm has "fallen asleep". A nerve block can last for 2 - 36 hours or more depending on the medication used.  Usually the weakness wears off first.  The tingling and heaviness usually wear off next.  Finally you may start to notice pain.  Keep in mind that this may occur in any order.  once a nerve block starts to wear off it is usually completely gone within 60 minutes. ISNB may cause mild shortness of breath, a hoarse voice, blurry vision, unequal pupils, or drooping of the face on the same side as the nerve block.  These symptoms will usually go away within 12 hours.  Very rarely the procedure itself can cause mild seizures. If needed, your surgeon will give you a prescription for pain medication.  It will take about 60 minutes for the oral pain medication to become fully effective.  So, it is recommended that you start taking this medication before the nerve block first begins to wear off, or when you first begin to feel discomfort. Keep in mind that nerve blocks often wear off in the middle of the night.   If you are going to bed and the block has not started to wear off or you have not started to have any discomfort, consider setting an alarm for 2 to 3 hours, so you can assess your block.  If you notice the block is wearing off or you are starting to have discomfort, you can take your pain medication. Take your pain medication only as prescribed.  Pain medication can cause sedation and decrease your breathing if you take more than you need for the level of pain that you have. Nausea is a common side effect of many pain  medications.  You may want to eat something before taking your pain medicine to prevent nausea. After an Interscalene nerve block, you cannot feel pain, pressure or extremes in temperature in the effected arm.  Because your arm is numb it is at an increased risk for injury.  To decrease the possibility of injury, please practice the following:  While you are awake change the position of your arm frequently to prevent too much pressure on any one area for prolonged periods of time.  If you have a cast or tight dressing, check the color or your fingers every couple of hours.  Call your surgeon with the appearance of any discoloration (white or blue). If you are given a sling to wear before you go home, please wear it  at all times until the block has completely worn off.  Do not get up at night without your sling. If you experience any problems or concerns, please contact your surgeon's office. If you experience severe or prolonged shortness of breath go to the nearest emergency department.

## 2024-01-09 NOTE — Anesthesia Postprocedure Evaluation (Signed)
Anesthesia Post Note  Patient: Heather Lopez  Procedure(s) Performed: ARTERIOVENOUS (AV) FISTULA CREATION (BRACHIAL CEPHALIC) (Left)  Patient location during evaluation: PACU Anesthesia Type: General Level of consciousness: awake and alert Pain management: pain level controlled Vital Signs Assessment: post-procedure vital signs reviewed and stable Respiratory status: spontaneous breathing, nonlabored ventilation, respiratory function stable and patient connected to nasal cannula oxygen Cardiovascular status: blood pressure returned to baseline and stable Postop Assessment: no apparent nausea or vomiting Anesthetic complications: no   No notable events documented.   Last Vitals:  Vitals:   01/09/24 1015 01/09/24 1042  BP: (!) 165/69 (!) 184/82  Pulse: 66 84  Resp: (!) 21   Temp:  36.7 C  SpO2: 92% 93%    Last Pain:  Vitals:   01/09/24 1042  TempSrc: Temporal  PainSc: 0-No pain                 Corinda Gubler

## 2024-01-10 ENCOUNTER — Encounter: Payer: Self-pay | Admitting: Vascular Surgery

## 2024-02-07 ENCOUNTER — Ambulatory Visit (INDEPENDENT_AMBULATORY_CARE_PROVIDER_SITE_OTHER): Payer: Medicare Other | Admitting: Physician Assistant

## 2024-02-07 ENCOUNTER — Encounter: Payer: Self-pay | Admitting: Physician Assistant

## 2024-02-07 ENCOUNTER — Telehealth: Payer: Self-pay | Admitting: Physician Assistant

## 2024-02-07 VITALS — BP 173/84 | HR 76 | Ht 63.0 in | Wt 201.0 lb

## 2024-02-07 DIAGNOSIS — N1330 Unspecified hydronephrosis: Secondary | ICD-10-CM

## 2024-02-07 DIAGNOSIS — N3281 Overactive bladder: Secondary | ICD-10-CM | POA: Diagnosis not present

## 2024-02-07 LAB — BLADDER SCAN AMB NON-IMAGING

## 2024-02-07 NOTE — Telephone Encounter (Signed)
 Pt informed, voiced understanding

## 2024-02-07 NOTE — Progress Notes (Signed)
 02/07/2024 12:54 PM   Heather Lopez Nov 09, 1946 [id number]  CC: Chief Complaint  Patient presents with   Follow-up   HPI: Heather Lopez is a 78 y.o. female with PMH ESRD on HD, uncontrolled diabetes, nephrolithiasis, bilateral VUR, right hydronephrosis managed with a chronic right ureteral stent most recently exchanged in February 2024, and OAB on trospium 20 mg twice daily who presents today for follow-up.  She is accompanied today by her husband, Heather Lopez, who contributes to HPI.  She was scheduled for routine stent exchange with Dr. Lonna Cobb 2 months ago, however this was canceled due to hyperglycemia.  Today she reports she has been started on a strict diet for tight glycemic control and is scheduled to follow-up with her endocrinologist with repeat labs next month.  She continues to make urine and her symptoms are rather well-controlled on trospium.  They wonder if she actually needs a stent exchange or she can just have her stent removed at this point, since she is now on dialysis.  PVR 56 mL.  PMH: Past Medical History:  Diagnosis Date   Acute renal failure (HCC) 06/27/2021   Anemia in chronic kidney disease    Anxiety    Aortic atherosclerosis (HCC)    Cataract    Depression    Dysrhythmia    ESRD on dialysis (HCC)    GERD (gastroesophageal reflux disease)    Gout    History of kidney stones    Hyperlipidemia    Hypertension, essential    Nausea & vomiting 12/02/2021   Obesity    Obstruction of left ureteropelvic junction (UPJ) due to stone 06/2021   Osteoarthritis    Perforated duodenal ulcer (HCC) 06/27/2021   Pyonephrosis 06/2021   left   Septic shock (HCC) 06/27/2021   Spontaneous bacterial peritonitis (HCC)    Subdural hematoma (HCC) 08/2023   Type 2 diabetes mellitus with stage 5 chronic kidney disease (HCC)    Vertigo     Surgical History: Past Surgical History:  Procedure Laterality Date   AV FISTULA PLACEMENT Left 01/09/2024   Procedure: ARTERIOVENOUS  (AV) FISTULA CREATION (BRACHIAL CEPHALIC);  Surgeon: Annice Needy, MD;  Location: [facility];  Service: Vascular;  Laterality: Left;   CAPD REMOVAL N/A 08/02/2023   Procedure: CONTINUOUS AMBULATORY PERITONEAL DIALYSIS  (CAPD) CATHETER REMOVAL;  Surgeon: Leafy Ro, MD;  Location: [facility];  Service: General;  Laterality: N/A;   CATARACT EXTRACTION, BILATERAL Bilateral 2019   COLONOSCOPY WITH PROPOFOL N/A 08/19/2015   Procedure: COLONOSCOPY WITH PROPOFOL;  Surgeon: Midge Minium, MD;  Location: [facility];  Service: Endoscopy;  Laterality: N/A;  diabetic - insulin   CYSTOSCOPY W/ RETROGRADES Bilateral 12/22/2019   Procedure: CYSTOSCOPY WITH RETROGRADE PYELOGRAM;  Surgeon: Riki Altes, MD;  Location: [facility];  Service: Urology;  Laterality: Bilateral;   CYSTOSCOPY W/ URETERAL STENT PLACEMENT Bilateral 07/05/2021   Procedure: CYSTOSCOPY WITH RETROGRADE PYELOGRAM/BILATERAL URETERAL STENT PLACEMENT;  Surgeon: Riki Altes, MD;  Location: [facility];  Service: Urology;  Laterality: Bilateral;   CYSTOSCOPY W/ URETERAL STENT PLACEMENT Bilateral 12/26/2021   Procedure: CYSTOSCOPY WITH STENT REMOVAL;  Surgeon: Riki Altes, MD;  Location: [facility];  Service: Urology;  Laterality: Bilateral;   CYSTOSCOPY W/ URETERAL STENT PLACEMENT Right 03/13/2022   Procedure: CYSTOSCOPY WITH RETROGRADE PYELOGRAM/URETERAL STENT PLACEMENT;  Surgeon: Riki Altes, MD;  Location: [facility];  Service: Urology;  Laterality: Right;   CYSTOSCOPY W/ URETERAL STENT PLACEMENT Right 01/01/2023   Procedure: CYSTOSCOPY WITH STENT EXCHANGE;  Surgeon: Riki Altes, MD;  Location: [facility];  Service: Urology;  Laterality: Right;   CYSTOSCOPY W/ URETERAL STENT REMOVAL Right 05/08/2022   Procedure: CYSTOSCOPY WITH STENT REMOVAL;  Surgeon: Riki Altes, MD;  Location: [facility];  Service: Urology;  Laterality: Right;   CYSTOSCOPY WITH BIOPSY N/A 12/22/2019   Procedure: CYSTOSCOPY WITH bladder BIOPSY;  Surgeon:  Riki Altes, MD;  Location: [facility];  Service: Urology;  Laterality: N/A;   CYSTOSCOPY WITH BIOPSY  03/13/2022   Procedure: CYSTOSCOPY WITH BLADDER BIOPSY;  Surgeon: Riki Altes, MD;  Location: [facility];  Service: Urology;;   CYSTOSCOPY WITH STENT PLACEMENT Right 12/22/2019   Procedure: CYSTOSCOPY WITH STENT PLACEMENT;  Surgeon: Riki Altes, MD;  Location: [facility];  Service: Urology;  Laterality: Right;   CYSTOSCOPY WITH STENT PLACEMENT Right 07/03/2022   Procedure: CYSTOSCOPY/RIGHT URETEROSCOPY WITH STENT PLACEMENT;  Surgeon: Riki Altes, MD;  Location: [facility];  Service: Urology;  Laterality: Right;   CYSTOSCOPY WITH URETEROSCOPY Bilateral 12/22/2019   Procedure: CYSTOSCOPY WITH URETEROSCOPY;  Surgeon: Riki Altes, MD;  Location: [facility];  Service: Urology;  Laterality: Bilateral;   CYSTOSCOPY/URETEROSCOPY/HOLMIUM LASER/STENT PLACEMENT Right 12/22/2019   Procedure: CYSTOSCOPY/URETEROSCOPY/HOLMIUM LASER/STENT PLACEMENT;  Surgeon: Riki Altes, MD;  Location: [facility];  Service: Urology;  Laterality: Right;   CYSTOSCOPY/URETEROSCOPY/HOLMIUM LASER/STENT PLACEMENT Bilateral 12/08/2021   Procedure: CYSTOSCOPY/URETEROSCOPY/HOLMIUM LASER/STENT PLACEMENT;  Surgeon: Sondra Come, MD;  Location: [facility];  Service: Urology;  Laterality: Bilateral;   DIALYSIS/PERMA CATHETER INSERTION N/A 06/29/2021   Procedure: DIALYSIS/PERMA CATHETER INSERTION;  Surgeon: Annice Needy, MD;  Location: [facility];  Service: Cardiovascular;  Laterality: N/A;   DIALYSIS/PERMA CATHETER INSERTION N/A 07/13/2021   Procedure: DIALYSIS/PERMA CATHETER INSERTION;  Surgeon: Annice Needy, MD;  Location: [facility];  Service: Cardiovascular;  Laterality: N/A;   DIALYSIS/PERMA CATHETER INSERTION N/A 08/01/2023   Procedure: DIALYSIS/PERMA CATHETER INSERTION;  Surgeon: Annice Needy, MD;  Location: [facility];  Service: Cardiovascular;  Laterality: N/A;   EYE SURGERY      INCISIONAL HERNIA REPAIR  06/28/2023   IR GASTROSTOMY TUBE MOD SED  08/01/2021   LAPAROTOMY N/A 06/29/2021   Procedure: EXPLORATORY LAPAROTOMY WITH REPAIR OF DUODENAL PERFORATION;  Surgeon: Henrene Dodge, MD;  Location: [facility];  Service: General;  Laterality: N/A;   POLYPECTOMY  08/19/2015   Procedure: POLYPECTOMY;  Surgeon: Midge Minium, MD;  Location: [facility];  Service: Endoscopy;;   TUBAL LIGATION  1992    Home Medications:  Allergies as of 02/07/2024       Reactions   Contrast Media  [iodinated Contrast Media] Anaphylaxis   Iodine Swelling   (IV only) - angioedema        Medication List        Accurate as of February 07, 2024 12:54 PM. If you have any questions, ask your nurse or doctor.          ascorbic acid 500 MG tablet Commonly known as: VITAMIN C Take 500 mg by mouth daily.   aspirin EC 81 MG tablet Take 81 mg by mouth daily.   atorvastatin 20 MG tablet Commonly known as: LIPITOR Take 20 mg by mouth at bedtime.   buPROPion ER 100 MG 12 hr tablet Commonly known as: WELLBUTRIN SR Take 100 mg by mouth daily.   busPIRone 5 MG tablet Commonly known as: BUSPAR Take 5 mg by mouth daily.   calcium-vitamin D 500-5 MG-MCG tablet Commonly known as:  OSCAL WITH D Take 1 tablet by mouth daily in the afternoon.   cloNIDine 0.1 MG tablet Commonly known as: CATAPRES Take 1 tablet (0.1 mg total) by mouth 2 (two) times daily.   cyanocobalamin 1000 MCG tablet Commonly known as: VITAMIN B12 Take 1,000 mcg by mouth daily.   ferrous sulfate 325 (65 FE) MG tablet Take 325 mg by mouth daily with breakfast.   insulin aspart 100 UNIT/ML injection Commonly known as: novoLOG Inject 3 Units into the skin 3 (three) times daily before meals. What changed:  how much to take when to take this   insulin glargine 100 UNIT/ML injection Commonly known as: LANTUS Inject 0.15 mLs (15 Units total) into the skin at bedtime. What changed:  how much to take when to  take this   lactulose 10 GM/15ML solution Commonly known as: CHRONULAC Take 45 mLs (30 g total) by mouth 2 (two) times daily as needed for mild constipation or moderate constipation.   loperamide 2 MG tablet Commonly known as: IMODIUM A-D Take 2 mg by mouth every 6 (six) hours as needed for diarrhea or loose stools.   magnesium 30 MG tablet Take 30 mg by mouth daily.   metoprolol tartrate 100 MG tablet Commonly known as: LOPRESSOR Take 1 tablet (100 mg total) by mouth 2 (two) times daily.   multivitamin with minerals Tabs tablet Take 1 tablet by mouth daily.   polyethylene glycol 17 g packet Commonly known as: MIRALAX / GLYCOLAX Take 17 g by mouth daily as needed for mild constipation or moderate constipation.   senna-docusate 8.6-50 MG tablet Commonly known as: Senokot-S Take 1 tablet by mouth at bedtime as needed for mild constipation or moderate constipation.   traMADol 50 MG tablet Commonly known as: Ultram Take 1 tablet (50 mg total) by mouth every 6 (six) hours as needed.   trospium 20 MG tablet Commonly known as: SANCTURA Take 1 tablet (20 mg total) by mouth 2 (two) times daily.        Allergies:  Allergies  Allergen Reactions   Contrast Media  [Iodinated Contrast Media] Anaphylaxis   Iodine Swelling    (IV only) - angioedema    Family History: Family History  Problem Relation Age of Onset   Diabetes Father    Cancer Father        lung cancer   Diabetes Brother    Stroke Mother     Social History:   reports that she quit smoking about 52 years ago. Her smoking use included cigarettes. She started smoking about 58 years ago. She has a 4.5 pack-year smoking history. She has never used smokeless tobacco. She reports that she does not drink alcohol and does not use drugs.  Physical Exam: BP (!) 173/84   Pulse 76   Ht 5\' 3"  (1.6 m)   Wt 201 lb (91.2 kg)   BMI 35.61 kg/m   Constitutional:  Alert and oriented, no acute distress, nontoxic  appearing HEENT: [place], AT Cardiovascular: No clubbing, cyanosis, or edema Respiratory: Normal respiratory effort, no increased work of breathing Skin: No rashes, bruises or suspicious lesions Neurologic: Grossly intact, no focal deficits, moving all 4 extremities Psychiatric: Normal mood and affect  Laboratory Data: Results for orders placed or performed in visit on 02/07/24  BLADDER SCAN AMB NON-IMAGING   Collection Time: 02/07/24  9:22 AM  Result Value Ref Range   Scan Result 56ml    Assessment & Plan:   1. OAB (overactive bladder) (Primary) Well-controlled on  trospium IR 20 mg twice daily, will continue this. - BLADDER SCAN AMB NON-IMAGING  2. Hydronephrosis of right kidney Due for right stent exchange, case deferred due to hyperglycemia.  I asked her to reach out to [place] via MyChart after her repeat labs with endocrinology next month to reassess her candidacy for surgery.  I will discuss possible stent removal rather than exchange with Dr. Lonna Cobb and reach out to them with his thoughts.  Return for I will follow-up based on conversation with Dr. Lonna Cobb.  Carman Ching, PA-C  [facility] [place] [address], [address] [place], [place] [address] [phone]

## 2024-02-07 NOTE — Telephone Encounter (Signed)
 I spoke with Dr. Lonna Cobb about possible stent removal rather than exchange.  The concern is that even though she is already on dialysis, she still appears to have some degree of obstruction related to her hydronephrosis, and simply removing the stent will increase her risk for complicated urinary tract infections and sepsis.  He recommends stent exchange and not removal.  Please keep plans for them to follow-up with [place] after endocrinology visit next month.

## 2024-02-18 ENCOUNTER — Other Ambulatory Visit (INDEPENDENT_AMBULATORY_CARE_PROVIDER_SITE_OTHER): Payer: Self-pay | Admitting: Vascular Surgery

## 2024-02-18 DIAGNOSIS — N186 End stage renal disease: Secondary | ICD-10-CM

## 2024-02-19 ENCOUNTER — Ambulatory Visit (INDEPENDENT_AMBULATORY_CARE_PROVIDER_SITE_OTHER): Payer: Medicare Other | Admitting: Nurse Practitioner

## 2024-02-19 ENCOUNTER — Ambulatory Visit (INDEPENDENT_AMBULATORY_CARE_PROVIDER_SITE_OTHER): Payer: Medicare Other

## 2024-02-19 ENCOUNTER — Encounter (INDEPENDENT_AMBULATORY_CARE_PROVIDER_SITE_OTHER): Payer: Self-pay | Admitting: Nurse Practitioner

## 2024-02-19 VITALS — BP 169/79 | HR 67 | Resp 16 | Ht 63.0 in | Wt 205.0 lb

## 2024-02-19 DIAGNOSIS — N186 End stage renal disease: Secondary | ICD-10-CM

## 2024-02-19 NOTE — Progress Notes (Signed)
 Subjective:    Patient ID: Heather Lopez, female    DOB: 08-Jul-1946, 78 y.o.   MRN: [id number] Chief Complaint  Patient presents with   Routine Post Op    [facility] 6 week with HDA    The patient is a 78 year old female who returns today for follow-up evaluation after her left brachiocephalic AV fistula placement on 01/09/2024.  Currently she has no open wounds or evidence of infection.  Her incision is completely healed at this time.  She denies any numbness or pain in her hand or fingertips.  She is currently maintained via PermCath and it is working well at this time.  She has a good strong thrill and bruit however fistula is not significantly prominent.  Today she has a flow volume of 1211 with no significant stenosis noted.  Fistula appears to be patent throughout.    Review of Systems  Neurological:  Positive for weakness.  All other systems reviewed and are negative.      Objective:   Physical Exam Vitals reviewed.  HENT:     Head: Normocephalic.  Cardiovascular:     Rate and Rhythm: Normal rate.     Pulses:          Radial pulses are 1+ on the left side.     Arteriovenous access: Left arteriovenous access is present. Pulmonary:     Effort: Pulmonary effort is normal.  Skin:    General: Skin is warm and dry.  Neurological:     Mental Status: She is alert and oriented to person, place, and time.  Psychiatric:        Mood and Affect: Mood normal.        Behavior: Behavior normal.        Thought Content: Thought content normal.        Judgment: Judgment normal.     BP (!) 169/79   Pulse 67   Resp 16   Ht 5\' 3"  (1.6 m)   Wt 205 lb (93 kg)   BMI 36.31 kg/m   Past Medical History:  Diagnosis Date   Acute renal failure (HCC) 06/27/2021   Anemia in chronic kidney disease    Anxiety    Aortic atherosclerosis (HCC)    Cataract    Depression    Dysrhythmia    ESRD on dialysis (HCC)    GERD (gastroesophageal reflux disease)    Gout    History of kidney stones     Hyperlipidemia    Hypertension, essential    Nausea & vomiting 12/02/2021   Obesity    Obstruction of left ureteropelvic junction (UPJ) due to stone 06/2021   Osteoarthritis    Perforated duodenal ulcer (HCC) 06/27/2021   Pyonephrosis 06/2021   left   Septic shock (HCC) 06/27/2021   Spontaneous bacterial peritonitis (HCC)    Subdural hematoma (HCC) 08/2023   Type 2 diabetes mellitus with stage 5 chronic kidney disease (HCC)    Vertigo     Social History   Socioeconomic History   Marital status: Married    Spouse name: [name]   Number of children: Not on file   Years of education: Not on file   Highest education level: Not on file  Occupational History   Not on file  Tobacco Use   Smoking status: Former    Current packs/day: 0.00    Average packs/day: 0.8 packs/day for 6.0 years (4.5 ttl pk-yrs)    Types: Cigarettes    Start date: 74  Quit date: 65    Years since quitting: 52.2   Smokeless tobacco: Never   Tobacco comments:    quit 1973  Vaping Use   Vaping status: Never Used  Substance and Sexual Activity   Alcohol use: No    Alcohol/week: 0.0 standard drinks of alcohol   Drug use: No   Sexual activity: Not on file  Other Topics Concern   Not on file  Social History Narrative   Not on file   Social Drivers of Health   Financial Resource Strain: Low Risk  (07/14/2023)   Received from [facility]   Overall Financial Resource Strain (CARDIA)    Difficulty of Paying Living Expenses: Not very hard  Food Insecurity: No Food Insecurity (09/17/2023)   Hunger Vital Sign    Worried About Running Out of Food in the Last Year: Never true    Ran Out of Food in the Last Year: Never true  Transportation Needs: No Transportation Needs (09/17/2023)   PRAPARE - Administrator, Civil Service (Medical): No    Lack of Transportation (Non-Medical): No  Physical Activity: Unknown (07/14/2023)   [facility]   Exercise Vital Sign    Days of  Exercise per Week: 0 days    Minutes of Exercise per Session: Not on file  Stress: No Stress Concern Present (07/14/2023)   Received from [facility] - Occupational Stress Questionnaire    Feeling of Stress : Not at all  Social Connections: Socially Integrated (07/14/2023)   Received from [facility]   Social Network    How would you rate your social network (family, work, friends)?: Good participation with social networks  Intimate Partner Violence: Not At Risk (09/17/2023)   Humiliation, Afraid, Rape, and Kick questionnaire    Fear of Current or Ex-Partner: No    Emotionally Abused: No    Physically Abused: No    Sexually Abused: No    Past Surgical History:  Procedure Laterality Date   AV FISTULA PLACEMENT Left 01/09/2024   Procedure: ARTERIOVENOUS (AV) FISTULA CREATION (BRACHIAL CEPHALIC);  Surgeon: Annice Needy, MD;  Location: [facility];  Service: Vascular;  Laterality: Left;   CAPD REMOVAL N/A 08/02/2023   Procedure: CONTINUOUS AMBULATORY PERITONEAL DIALYSIS  (CAPD) CATHETER REMOVAL;  Surgeon: Leafy Ro, MD;  Location: [facility];  Service: General;  Laterality: N/A;   CATARACT EXTRACTION, BILATERAL Bilateral 2019   COLONOSCOPY WITH PROPOFOL N/A 08/19/2015   Procedure: COLONOSCOPY WITH PROPOFOL;  Surgeon: Midge Minium, MD;  Location[facility];  Service: Endoscopy;  Laterality: N/A;  diabetic - insulin   CYSTOSCOPY W/ RETROGRADES Bilateral 12/22/2019   Procedure: CYSTOSCOPY WITH RETROGRADE PYELOGRAM;  Surgeon: Riki Altes, MD;  Location: [facility];  Service: Urology;  Laterality: Bilateral;   CYSTOSCOPY W/ URETERAL STENT PLACEMENT Bilateral 07/05/2021   Procedure: CYSTOSCOPY WITH RETROGRADE PYELOGRAM/BILATERAL URETERAL STENT PLACEMENT;  Surgeon: Riki Altes, MD;  Location: [facility];  Service: Urology;  Laterality: Bilateral;   CYSTOSCOPY W/ URETERAL STENT PLACEMENT Bilateral 12/26/2021   Procedure: CYSTOSCOPY WITH  STENT REMOVAL;  Surgeon: Riki Altes, MD;  Location: [facility];  Service: Urology;  Laterality: Bilateral;   CYSTOSCOPY W/ URETERAL STENT PLACEMENT Right 03/13/2022   Procedure: CYSTOSCOPY WITH RETROGRADE PYELOGRAM/URETERAL STENT PLACEMENT;  Surgeon: Riki Altes, MD;  Location: [facility];  Service: Urology;  Laterality: Right;   CYSTOSCOPY W/ URETERAL STENT PLACEMENT Right 01/01/2023   Procedure: CYSTOSCOPY WITH STENT  EXCHANGE;  Surgeon: Riki Altes, MD;  Location: [facility];  Service: Urology;  Laterality: Right;   CYSTOSCOPY W/ URETERAL STENT REMOVAL Right 05/08/2022   Procedure: CYSTOSCOPY WITH STENT REMOVAL;  Surgeon: Riki Altes, MD;  Location: [facility];  Service: Urology;  Laterality: Right;   CYSTOSCOPY WITH BIOPSY N/A 12/22/2019   Procedure: CYSTOSCOPY WITH bladder BIOPSY;  Surgeon: Riki Altes, MD;  Location: [facility];  Service: Urology;  Laterality: N/A;   CYSTOSCOPY WITH BIOPSY  03/13/2022   Procedure: CYSTOSCOPY WITH BLADDER BIOPSY;  Surgeon: Riki Altes, MD;  Location: [facility];  Service: Urology;;   CYSTOSCOPY WITH STENT PLACEMENT Right 12/22/2019   Procedure: CYSTOSCOPY WITH STENT PLACEMENT;  Surgeon: Riki Altes, MD;  Location: [facility];  Service: Urology;  Laterality: Right;   CYSTOSCOPY WITH STENT PLACEMENT Right 07/03/2022   Procedure: CYSTOSCOPY/RIGHT URETEROSCOPY WITH STENT PLACEMENT;  Surgeon: Riki Altes, MD;  Location: [facility];  Service: Urology;  Laterality: Right;   CYSTOSCOPY WITH URETEROSCOPY Bilateral 12/22/2019   Procedure: CYSTOSCOPY WITH URETEROSCOPY;  Surgeon: Riki Altes, MD;  Location: [facility];  Service: Urology;  Laterality: Bilateral;   CYSTOSCOPY/URETEROSCOPY/HOLMIUM LASER/STENT PLACEMENT Right 12/22/2019   Procedure: CYSTOSCOPY/URETEROSCOPY/HOLMIUM LASER/STENT PLACEMENT;  Surgeon: Riki Altes, MD;  Location: [facility];  Service: Urology;  Laterality: Right;   CYSTOSCOPY/URETEROSCOPY/HOLMIUM LASER/STENT PLACEMENT  Bilateral 12/08/2021   Procedure: CYSTOSCOPY/URETEROSCOPY/HOLMIUM LASER/STENT PLACEMENT;  Surgeon: Sondra Come, MD;  Location: [facility];  Service: Urology;  Laterality: Bilateral;   DIALYSIS/PERMA CATHETER INSERTION N/A 06/29/2021   Procedure: DIALYSIS/PERMA CATHETER INSERTION;  Surgeon: Annice Needy, MD;  Location: [facility];  Service: Cardiovascular;  Laterality: N/A;   DIALYSIS/PERMA CATHETER INSERTION N/A 07/13/2021   Procedure: DIALYSIS/PERMA CATHETER INSERTION;  Surgeon: Annice Needy, MD;  Location: [facility];  Service: Cardiovascular;  Laterality: N/A;   DIALYSIS/PERMA CATHETER INSERTION N/A 08/01/2023   Procedure: DIALYSIS/PERMA CATHETER INSERTION;  Surgeon: Annice Needy, MD;  Location: [facility];  Service: Cardiovascular;  Laterality: N/A;   EYE SURGERY     INCISIONAL HERNIA REPAIR  06/28/2023   IR GASTROSTOMY TUBE MOD SED  08/01/2021   LAPAROTOMY N/A 06/29/2021   Procedure: EXPLORATORY LAPAROTOMY WITH REPAIR OF DUODENAL PERFORATION;  Surgeon: Henrene Dodge, MD;  Location: [facility];  Service: General;  Laterality: N/A;   POLYPECTOMY  08/19/2015   Procedure: POLYPECTOMY;  Surgeon: Midge Minium, MD;  Location: [facility];  Service: Endoscopy;;   TUBAL LIGATION  1992    Family History  Problem Relation Age of Onset   Diabetes Father    Cancer Father        lung cancer   Diabetes Brother    Stroke Mother     Allergies  Allergen Reactions   Contrast Media  [Iodinated Contrast Media] Anaphylaxis   Iodine Swelling    (IV only) - angioedema       Latest Ref Rng & Units 01/09/2024    6:29 AM 12/03/2023    9:54 AM 12/03/2023    8:58 AM  CBC  WBC 4.0 - 10.5 K/uL  5.9    Hemoglobin 12.0 - 15.0 g/dL 78.4  69.6  29.5   Hematocrit 36.0 - 46.0 % 41.0  33.4  33.0   Platelets 150 - 400 K/uL  232        CMP     Component Value Date/Time   NA 136 01/09/2024 0629   NA 138 10/03/2022 1350   NA 133 (  L) 01/29/2014 0406   K 4.0 01/09/2024  0629   K 4.0 01/29/2014 0406   CL 98 01/09/2024 0629   CL 96 (L) 01/29/2014 0406   CO2 22 12/03/2023 0954   CO2 29 01/29/2014 0406   GLUCOSE 332 (H) 01/09/2024 0629   GLUCOSE 377 (H) 01/29/2014 0406   BUN 25 (H) 01/09/2024 0629   BUN 39 (H) 10/03/2022 1350   BUN 31 (H) 01/29/2014 0406   CREATININE 4.00 (H) 01/09/2024 0629   CREATININE 1.17 (H) 03/17/2018 0830   CALCIUM 8.6 (L) 12/03/2023 0954   CALCIUM 9.7 01/29/2014 0406   PROT 7.2 09/17/2023 0530   PROT 6.9 02/10/2022 0939   PROT 8.1 01/27/2014 1318   ALBUMIN 2.6 (L) 09/21/2023 0736   ALBUMIN 3.8 02/10/2022 0939   ALBUMIN 4.1 01/27/2014 1318   AST 36 09/17/2023 0530   AST 36 01/27/2014 1318   ALT 33 09/17/2023 0530   ALT 30 01/27/2014 1318   ALKPHOS 113 09/17/2023 0530   ALKPHOS 110 01/27/2014 1318   BILITOT 0.6 09/17/2023 0530   BILITOT <0.2 02/10/2022 0939   BILITOT 0.5 01/27/2014 1318   EGFR 18 (L) 10/03/2022 1350   GFRNONAA 11 (L) 12/03/2023 0954   GFRNONAA 48 (L) 08/21/2016 0908     No results found.     Assessment & Plan:   1. ESRD (end stage renal disease) (HCC) (Primary) Today the patient has a good flow volume and a good thrill but it is not easily palpable.  Its only been approximately 6 weeks since placement.  Prior to performing intervention I think it would be prudent to allow it to mature for an additional 4 weeks.  Will have her return at that time in order to evaluate the maturity of the fistula before allowing her to utilize it.   Current Outpatient Medications on File Prior to Visit  Medication Sig Dispense Refill   ascorbic acid (VITAMIN C) 500 MG tablet Take 500 mg by mouth daily.     aspirin 81 MG EC tablet Take 81 mg by mouth daily.     atorvastatin (LIPITOR) 20 MG tablet Take 20 mg by mouth at bedtime.     buPROPion ER (WELLBUTRIN SR) 100 MG 12 hr tablet Take 100 mg by mouth daily.     busPIRone (BUSPAR) 5 MG tablet Take 5 mg by mouth daily.     calcium-vitamin D (OSCAL WITH D) 500-5 MG-MCG  tablet Take 1 tablet by mouth daily in the afternoon.     cloNIDine (CATAPRES) 0.1 MG tablet Take 1 tablet (0.1 mg total) by mouth 2 (two) times daily. 60 tablet 11   cyanocobalamin (VITAMIN B12) 1000 MCG tablet Take 1,000 mcg by mouth daily.     ferrous sulfate 325 (65 FE) MG tablet Take 325 mg by mouth daily with breakfast.     insulin aspart (NOVOLOG) 100 UNIT/ML injection Inject 3 Units into the skin 3 (three) times daily before meals. (Patient taking differently: Inject 9 Units into the skin in the morning and at bedtime.) 10 mL 11   insulin glargine (LANTUS) 100 UNIT/ML injection Inject 0.15 mLs (15 Units total) into the skin at bedtime. (Patient taking differently: Inject 20 Units into the skin daily.) 10 mL 11   lactulose (CHRONULAC) 10 GM/15ML solution Take 45 mLs (30 g total) by mouth 2 (two) times daily as needed for mild constipation or moderate constipation. 236 mL 0   loperamide (IMODIUM A-D) 2 MG tablet Take 2 mg by mouth every  6 (six) hours as needed for diarrhea or loose stools.     magnesium 30 MG tablet Take 30 mg by mouth daily.     metoprolol tartrate (LOPRESSOR) 100 MG tablet Take 1 tablet (100 mg total) by mouth 2 (two) times daily. 60 tablet 2   Multiple Vitamin (MULTIVITAMIN WITH MINERALS) TABS tablet Take 1 tablet by mouth daily. 30 tablet 3   polyethylene glycol (MIRALAX / GLYCOLAX) 17 g packet Take 17 g by mouth daily as needed for mild constipation or moderate constipation.     senna-docusate (SENOKOT-S) 8.6-50 MG tablet Take 1 tablet by mouth at bedtime as needed for mild constipation or moderate constipation.     traMADol (ULTRAM) 50 MG tablet Take 1 tablet (50 mg total) by mouth every 6 (six) hours as needed. 20 tablet 0   trospium (SANCTURA) 20 MG tablet Take 1 tablet (20 mg total) by mouth 2 (two) times daily. 180 tablet 3   No current facility-administered medications on file prior to visit.    There are no Patient Instructions on file for this visit. No  follow-ups on file.   Georgiana Spinner, NP

## 2024-03-20 ENCOUNTER — Ambulatory Visit (INDEPENDENT_AMBULATORY_CARE_PROVIDER_SITE_OTHER): Admitting: Nurse Practitioner

## 2024-03-30 ENCOUNTER — Ambulatory Visit (INDEPENDENT_AMBULATORY_CARE_PROVIDER_SITE_OTHER): Admitting: Nurse Practitioner

## 2024-03-30 ENCOUNTER — Encounter (INDEPENDENT_AMBULATORY_CARE_PROVIDER_SITE_OTHER): Payer: Self-pay | Admitting: Nurse Practitioner

## 2024-03-30 VITALS — BP 111/65 | HR 69 | Resp 16 | Ht 63.0 in | Wt 210.0 lb

## 2024-03-30 DIAGNOSIS — N186 End stage renal disease: Secondary | ICD-10-CM

## 2024-03-31 ENCOUNTER — Encounter (INDEPENDENT_AMBULATORY_CARE_PROVIDER_SITE_OTHER): Payer: Self-pay | Admitting: Nurse Practitioner

## 2024-03-31 NOTE — Progress Notes (Signed)
 Subjective:    Patient ID: Heather Lopez, female    DOB: 01-Apr-1946, 78 y.o.   MRN: [id number] Chief Complaint  Patient presents with   Routine Post Op    [facility] 4 week post op    The patient is a 78 year old female who returns today for follow-up evaluation after her left brachiocephalic AV fistula placement on 01/09/2024.  Currently she has no open wounds or evidence of infection.  Her incision is completely healed at this time.  She denies any numbness or pain in her hand or fingertips.  She is currently maintained via PermCath and it is working well at this time.  She has a good strong thrill and bruit however fistula is not significantly prominent.  On duplex she had a bruit  she has a flow volume of 1211 with no significant stenosis noted.  Fistula appears to be patent throughout.  Previously her fistula was not readily palpable but today is much more easy to locate.    Review of Systems  Neurological:  Positive for weakness.  All other systems reviewed and are negative.      Objective:   Physical Exam Vitals reviewed.  HENT:     Head: Normocephalic.  Cardiovascular:     Rate and Rhythm: Normal rate.     Pulses:          Radial pulses are 1+ on the left side.     Arteriovenous access: Left arteriovenous access is present. Pulmonary:     Effort: Pulmonary effort is normal.  Skin:    General: Skin is warm and dry.  Neurological:     Mental Status: She is alert and oriented to person, place, and time.  Psychiatric:        Mood and Affect: Mood normal.        Behavior: Behavior normal.        Thought Content: Thought content normal.        Judgment: Judgment normal.     BP 111/65   Pulse 69   Resp 16   Ht 5\' 3"  (1.6 m)   Wt 210 lb (95.3 kg)   BMI 37.20 kg/m   Past Medical History:  Diagnosis Date   Acute renal failure (HCC) 06/27/2021   Anemia in chronic kidney disease    Anxiety    Aortic atherosclerosis (HCC)    Cataract    Depression    Dysrhythmia    ESRD  on dialysis (HCC)    GERD (gastroesophageal reflux disease)    Gout    History of kidney stones    Hyperlipidemia    Hypertension, essential    Nausea & vomiting 12/02/2021   Obesity    Obstruction of left ureteropelvic junction (UPJ) due to stone 06/2021   Osteoarthritis    Perforated duodenal ulcer (HCC) 06/27/2021   Pyonephrosis 06/2021   left   Septic shock (HCC) 06/27/2021   Spontaneous bacterial peritonitis (HCC)    Subdural hematoma (HCC) 08/2023   Type 2 diabetes mellitus with stage 5 chronic kidney disease (HCC)    Vertigo     Social History   Socioeconomic History   Marital status: Married    Spouse name: Landon Pinion   Number of children: Not on file   Years of education: Not on file   Highest education level: Not on file  Occupational History   Not on file  Tobacco Use   Smoking status: Former    Current packs/day: 0.00    Average packs/day:  0.8 packs/day for 6.0 years (4.5 ttl pk-yrs)    Types: Cigarettes    Start date: 76    Quit date: 77    Years since quitting: 52.3   Smokeless tobacco: Never   Tobacco comments:    quit 1973  Vaping Use   Vaping status: Never Used  Substance and Sexual Activity   Alcohol use: No    Alcohol/week: 0.0 standard drinks of alcohol   Drug use: No   Sexual activity: Not on file  Other Topics Concern   Not on file  Social History Narrative   Not on file   Social Drivers of Health   Financial Resource Strain: Low Risk  (07/14/2023)   Received from [facility]   Overall Financial Resource Strain (CARDIA)    Difficulty of Paying Living Expenses: Not very hard  Food Insecurity: No Food Insecurity (09/17/2023)   Hunger Vital Sign    Worried About Running Out of Food in the Last Year: Never true    Ran Out of Food in the Last Year: Never true  Transportation Needs: No Transportation Needs (09/17/2023)   PRAPARE - Administrator, Civil Service (Medical): No    Lack of Transportation (Non-Medical): No   Physical Activity: Unknown (07/14/2023)   [facility]   Exercise Vital Sign    Days of Exercise per Week: 0 days    Minutes of Exercise per Session: Not on file  Stress: No Stress Concern Present (07/14/2023)   Received from [facility] Occupational [facility] - Occupational Stress Questionnaire    Feeling of Stress : Not at all  Social Connections: Socially Integrated (07/14/2023)   Received from [facility]   Social Network    How would you rate your social network (family, work, friends)?: Good participation with social networks  Intimate Partner Violence: Not At Risk (09/17/2023)   Humiliation, Afraid, Rape, and Kick questionnaire    Fear of Current or Ex-Partner: No    Emotionally Abused: No    Physically Abused: No    Sexually Abused: No    Past Surgical History:  Procedure Laterality Date   AV FISTULA PLACEMENT Left 01/09/2024   Procedure: ARTERIOVENOUS (AV) FISTULA CREATION (BRACHIAL CEPHALIC);  Surgeon: Celso College, MD;  Location: [facility];  Service: Vascular;  Laterality: Left;   CAPD REMOVAL N/A 08/02/2023   Procedure: CONTINUOUS AMBULATORY PERITONEAL DIALYSIS  (CAPD) CATHETER REMOVAL;  Surgeon: Alben Alma, MD;  Location: [facility];  Service: General;  Laterality: N/A;   CATARACT EXTRACTION, BILATERAL Bilateral 2019   COLONOSCOPY WITH PROPOFOL  N/A 08/19/2015   Procedure: COLONOSCOPY WITH PROPOFOL ;  Surgeon: Marnee Sink, MD;  Location: [facility];  Service: Endoscopy;  Laterality: N/A;  diabetic - insulin    CYSTOSCOPY W/ RETROGRADES Bilateral 12/22/2019   Procedure: CYSTOSCOPY WITH RETROGRADE PYELOGRAM;  Surgeon: Geraline Knapp, MD;  Location: [facility];  Service: Urology;  Laterality: Bilateral;   CYSTOSCOPY W/ URETERAL STENT PLACEMENT Bilateral 07/05/2021   Procedure: CYSTOSCOPY WITH RETROGRADE PYELOGRAM/BILATERAL URETERAL STENT PLACEMENT;  Surgeon: Geraline Knapp, MD;  Location: [facility];  Service: Urology;   Laterality: Bilateral;   CYSTOSCOPY W/ URETERAL STENT PLACEMENT Bilateral 12/26/2021   Procedure: CYSTOSCOPY WITH STENT REMOVAL;  Surgeon: Geraline Knapp, MD;  Location: [facility];  Service: Urology;  Laterality: Bilateral;   CYSTOSCOPY W/ URETERAL STENT PLACEMENT Right 03/13/2022   Procedure: CYSTOSCOPY WITH RETROGRADE PYELOGRAM/URETERAL STENT PLACEMENT;  Surgeon: Geraline Knapp, MD;  Location: [facility];  Service: Urology;  Laterality: Right;   CYSTOSCOPY W/ URETERAL STENT PLACEMENT Right 01/01/2023   Procedure: CYSTOSCOPY WITH STENT EXCHANGE;  Surgeon: Geraline Knapp, MD;  Location: [facility];  Service: Urology;  Laterality: Right;   CYSTOSCOPY W/ URETERAL STENT REMOVAL Right 05/08/2022   Procedure: CYSTOSCOPY WITH STENT REMOVAL;  Surgeon: Geraline Knapp, MD;  Location: [facility];  Service: Urology;  Laterality: Right;   CYSTOSCOPY WITH BIOPSY N/A 12/22/2019   Procedure: CYSTOSCOPY WITH bladder BIOPSY;  Surgeon: Geraline Knapp, MD;  Location: [facility];  Service: Urology;  Laterality: N/A;   CYSTOSCOPY WITH BIOPSY  03/13/2022   Procedure: CYSTOSCOPY WITH BLADDER BIOPSY;  Surgeon: Geraline Knapp, MD;  Location: [facility];  Service: Urology;;   CYSTOSCOPY WITH STENT PLACEMENT Right 12/22/2019   Procedure: CYSTOSCOPY WITH STENT PLACEMENT;  Surgeon: Geraline Knapp, MD;  Location: [facility];  Service: Urology;  Laterality: Right;   CYSTOSCOPY WITH STENT PLACEMENT Right 07/03/2022   Procedure: CYSTOSCOPY/RIGHT URETEROSCOPY WITH STENT PLACEMENT;  Surgeon: Geraline Knapp, MD;  Location: [facility];  Service: Urology;  Laterality: Right;   CYSTOSCOPY WITH URETEROSCOPY Bilateral 12/22/2019   Procedure: CYSTOSCOPY WITH URETEROSCOPY;  Surgeon: Geraline Knapp, MD;  Location: [facility];  Service: Urology;  Laterality: Bilateral;   CYSTOSCOPY/URETEROSCOPY/HOLMIUM LASER/STENT PLACEMENT Right 12/22/2019   Procedure: CYSTOSCOPY/URETEROSCOPY/HOLMIUM LASER/STENT PLACEMENT;  Surgeon: Geraline Knapp, MD;   Location: [facility];  Service: Urology;  Laterality: Right;   CYSTOSCOPY/URETEROSCOPY/HOLMIUM LASER/STENT PLACEMENT Bilateral 12/08/2021   Procedure: CYSTOSCOPY/URETEROSCOPY/HOLMIUM LASER/STENT PLACEMENT;  Surgeon: Lawerence Pressman, MD;  Location: [facility];  Service: Urology;  Laterality: Bilateral;   DIALYSIS/PERMA CATHETER INSERTION N/A 06/29/2021   Procedure: DIALYSIS/PERMA CATHETER INSERTION;  Surgeon: Celso College, MD;  Location: [facility];  Service: Cardiovascular;  Laterality: N/A;   DIALYSIS/PERMA CATHETER INSERTION N/A 07/13/2021   Procedure: DIALYSIS/PERMA CATHETER INSERTION;  Surgeon: Celso College, MD;  Location: [facility];  Service: Cardiovascular;  Laterality: N/A;   DIALYSIS/PERMA CATHETER INSERTION N/A 08/01/2023   Procedure: DIALYSIS/PERMA CATHETER INSERTION;  Surgeon: Celso College, MD;  Location: [facility];  Service: Cardiovascular;  Laterality: N/A;   EYE SURGERY     INCISIONAL HERNIA REPAIR  06/28/2023   IR GASTROSTOMY TUBE MOD SED  08/01/2021   LAPAROTOMY N/A 06/29/2021   Procedure: EXPLORATORY LAPAROTOMY WITH REPAIR OF DUODENAL PERFORATION;  Surgeon: Emmalene Hare, MD;  Location: [facility];  Service: General;  Laterality: N/A;   POLYPECTOMY  08/19/2015   Procedure: POLYPECTOMY;  Surgeon: Marnee Sink, MD;  Location: [facility];  Service: Endoscopy;;   TUBAL LIGATION  1992    Family History  Problem Relation Age of Onset   Diabetes Father    Cancer Father        lung cancer   Diabetes Brother    Stroke Mother     Allergies  Allergen Reactions   Contrast Media  [Iodinated Contrast Media] Anaphylaxis   Iodine Swelling    (IV only) - angioedema       Latest Ref Rng & Units 01/09/2024    6:29 AM 12/03/2023    9:54 AM 12/03/2023    8:58 AM  CBC  WBC 4.0 - 10.5 K/uL  5.9    Hemoglobin 12.0 - 15.0 g/dL 16.1  09.6  04.5   Hematocrit 36.0 - 46.0 % 41.0  33.4  33.0   Platelets 150 - 400 K/uL  232        CMP  Component  Value Date/Time   NA 136 01/09/2024 0629   NA 138 10/03/2022 1350   NA 133 (L) 01/29/2014 0406   K 4.0 01/09/2024 0629   K 4.0 01/29/2014 0406   CL 98 01/09/2024 0629   CL 96 (L) 01/29/2014 0406   CO2 22 12/03/2023 0954   CO2 29 01/29/2014 0406   GLUCOSE 332 (H) 01/09/2024 0629   GLUCOSE 377 (H) 01/29/2014 0406   BUN 25 (H) 01/09/2024 0629   BUN 39 (H) 10/03/2022 1350   BUN 31 (H) 01/29/2014 0406   CREATININE 4.00 (H) 01/09/2024 0629   CREATININE 1.17 (H) 03/17/2018 0830   CALCIUM  8.6 (L) 12/03/2023 0954   CALCIUM  9.7 01/29/2014 0406   PROT 7.2 09/17/2023 0530   PROT 6.9 02/10/2022 0939   PROT 8.1 01/27/2014 1318   ALBUMIN  2.6 (L) 09/21/2023 0736   ALBUMIN  3.8 02/10/2022 0939   ALBUMIN  4.1 01/27/2014 1318   AST 36 09/17/2023 0530   AST 36 01/27/2014 1318   ALT 33 09/17/2023 0530   ALT 30 01/27/2014 1318   ALKPHOS 113 09/17/2023 0530   ALKPHOS 110 01/27/2014 1318   BILITOT 0.6 09/17/2023 0530   BILITOT <0.2 02/10/2022 0939   BILITOT 0.5 01/27/2014 1318   EGFR 18 (L) 10/03/2022 1350   GFRNONAA 11 (L) 12/03/2023 0954   GFRNONAA 48 (L) 08/21/2016 0908     No results found.     Assessment & Plan:   1. ESRD (end stage renal disease) (HCC) (Primary) Today the patient has a good flow volume and a good thrill and it is more easily palpable.  I believe that with skilled technicians it should be able to be accessed without difficulty.  I discussed with the patient the typical process for utilizing her fistula as well as PermCath removal.  We have faxed a letter to her dialysis center.  Will plan to have her return in 3 months as long as there is no ongoing issues with her fistula.   Current Outpatient Medications on File Prior to Visit  Medication Sig Dispense Refill   ascorbic acid  (VITAMIN C) 500 MG tablet Take 500 mg by mouth daily.     aspirin  81 MG EC tablet Take 81 mg by mouth daily.     atorvastatin  (LIPITOR) 20 MG tablet Take 20 mg by mouth at bedtime.     buPROPion   ER (WELLBUTRIN  SR) 100 MG 12 hr tablet Take 100 mg by mouth daily.     busPIRone  (BUSPAR ) 5 MG tablet Take 5 mg by mouth daily.     calcium -vitamin D  (OSCAL WITH D) 500-5 MG-MCG tablet Take 1 tablet by mouth daily in the afternoon.     cloNIDine  (CATAPRES ) 0.1 MG tablet Take 1 tablet (0.1 mg total) by mouth 2 (two) times daily. 60 tablet 11   cyanocobalamin  (VITAMIN B12) 1000 MCG tablet Take 1,000 mcg by mouth daily.     ferrous sulfate  325 (65 FE) MG tablet Take 325 mg by mouth daily with breakfast.     insulin  aspart (NOVOLOG ) 100 UNIT/ML injection Inject 3 Units into the skin 3 (three) times daily before meals. (Patient taking differently: Inject 9 Units into the skin in the morning and at bedtime.) 10 mL 11   insulin  glargine (LANTUS ) 100 UNIT/ML injection Inject 0.15 mLs (15 Units total) into the skin at bedtime. (Patient taking differently: Inject 20 Units into the skin daily.) 10 mL 11   lactulose  (CHRONULAC ) 10 GM/15ML solution Take 45 mLs (30 g total)  by mouth 2 (two) times daily as needed for mild constipation or moderate constipation. 236 mL 0   loperamide  (IMODIUM  A-D) 2 MG tablet Take 2 mg by mouth every 6 (six) hours as needed for diarrhea or loose stools.     magnesium  30 MG tablet Take 30 mg by mouth daily.     metoprolol  tartrate (LOPRESSOR ) 100 MG tablet Take 1 tablet (100 mg total) by mouth 2 (two) times daily. 60 tablet 2   Multiple Vitamin (MULTIVITAMIN WITH MINERALS) TABS tablet Take 1 tablet by mouth daily. 30 tablet 3   polyethylene glycol (MIRALAX  / GLYCOLAX ) 17 g packet Take 17 g by mouth daily as needed for mild constipation or moderate constipation.     senna-docusate (SENOKOT-S) 8.6-50 MG tablet Take 1 tablet by mouth at bedtime as needed for mild constipation or moderate constipation.     traMADol  (ULTRAM ) 50 MG tablet Take 1 tablet (50 mg total) by mouth every 6 (six) hours as needed. 20 tablet 0   trospium  (SANCTURA ) 20 MG tablet Take 1 tablet (20 mg total) by mouth 2  (two) times daily. 180 tablet 3   No current facility-administered medications on file prior to visit.    There are no Patient Instructions on file for this visit. No follow-ups on file.   Takuma Cifelli E Ariany Kesselman, NP

## 2024-04-10 ENCOUNTER — Telehealth (INDEPENDENT_AMBULATORY_CARE_PROVIDER_SITE_OTHER): Payer: Self-pay

## 2024-04-10 NOTE — Telephone Encounter (Signed)
 Patient spouse left a message stating that since last Friday his wife dialysis arm (left) became swollen, hot to touch, and bruised after the two sessions. The first 2-3 days was the worst. The left arm swelling has decreased and hot to touch has stop.Patient spouse is requesting for his wife to be seen in the office. I spoke with Wauneta Haddock NP and she advised that the patient could be scheduled for fistulagram or come in next week with HDA. Patient spouse will like to move forward with scheduling appointment with HDA and see Vonna Guardian or Wauneta Haddock NP. Please contact patient spouse to scheduled appointment.

## 2024-04-14 ENCOUNTER — Encounter (INDEPENDENT_AMBULATORY_CARE_PROVIDER_SITE_OTHER): Payer: Self-pay

## 2024-04-16 ENCOUNTER — Other Ambulatory Visit (INDEPENDENT_AMBULATORY_CARE_PROVIDER_SITE_OTHER): Payer: Self-pay | Admitting: Nurse Practitioner

## 2024-04-16 DIAGNOSIS — N186 End stage renal disease: Secondary | ICD-10-CM

## 2024-04-21 ENCOUNTER — Telehealth (INDEPENDENT_AMBULATORY_CARE_PROVIDER_SITE_OTHER): Payer: Self-pay

## 2024-04-21 ENCOUNTER — Ambulatory Visit (INDEPENDENT_AMBULATORY_CARE_PROVIDER_SITE_OTHER): Admitting: Vascular Surgery

## 2024-04-21 ENCOUNTER — Ambulatory Visit (INDEPENDENT_AMBULATORY_CARE_PROVIDER_SITE_OTHER)

## 2024-04-21 VITALS — BP 134/69 | HR 69 | Resp 17

## 2024-04-21 DIAGNOSIS — I10 Essential (primary) hypertension: Secondary | ICD-10-CM

## 2024-04-21 DIAGNOSIS — N186 End stage renal disease: Secondary | ICD-10-CM | POA: Diagnosis not present

## 2024-04-21 DIAGNOSIS — Z992 Dependence on renal dialysis: Secondary | ICD-10-CM | POA: Diagnosis not present

## 2024-04-21 DIAGNOSIS — E1122 Type 2 diabetes mellitus with diabetic chronic kidney disease: Secondary | ICD-10-CM

## 2024-04-21 DIAGNOSIS — Z794 Long term (current) use of insulin: Secondary | ICD-10-CM

## 2024-04-21 NOTE — Assessment & Plan Note (Signed)
 We did a duplex today to evaluate the fistula and saw what appeared to be a hemodynamically significant stenosis in the mid upper arm cephalic vein near the area of infiltration. Given this finding, she needs a fistulogram with likely intervention not only to improve the function of the fistula but to maintain its patency and avoid thrombosis.  Risks and benefits of the procedure were discussed with the patient in detail and she is agreeable to proceed.

## 2024-04-21 NOTE — Patient Instructions (Signed)
Angiogram, Care After After the procedure, it is common to have: Bruising and tenderness at the catheter insertion area. A collection of blood (hematoma) at the insertion area. This may feel like a small lump under the skin at the insertion site. Follow these instructions at home: Insertion site care  Follow instructions from your health care provider about how to take care of your insertion site. Make sure you: Wash your hands with soap and water for at least 20 seconds before and after you change your bandage (dressing). If soap and water are not available, use hand sanitizer. Change your dressing as told by your health care provider. Do not take baths, swim, or use a hot tub until your health care provider approves. You may shower 24-48 hours after the procedure, or as told by your health care provider. To clean the insertion site: Gently wash the area with plain soap and water. Pat the area dry with a clean towel. Do not rub the site. This may cause bleeding. Do not apply powder or lotion to the site. Keep the site clean and dry. Check your insertion site every day for signs of infection. Check for: Redness, swelling, or pain. Fluid or blood. Warmth. Pus or a bad smell. Activity If you were given a sedative during the procedure, it can affect you for several hours. Do not drive or operate machinery until your health care provider says that it is safe. Rest as told by your health care provider. This is usually for 1-2 days. If the catheter was inserted through your leg, avoid walking up or down the stairs for a few days. If the catheter was inserted through your wrist, avoid repetitive hand and wrist movement for a few days. Return to your normal activities as told by your health care provider, usually in about a week. Ask your health care provider what activities are safe for you. General instructions If your insertion site starts to bleed, lie flat and put pressure on the site. If  the bleeding does not stop, get help right away. This is a medical emergency. Take over-the-counter and prescription medicines only as told by your health care provider. Drink enough fluid to keep your urine pale yellow. This helps flush the contrast dye from your body. Keep all follow-up visits for continued treatment and for your safety. Contact a health care provider if: You have a fever or chills. You have any signs of infection at the insertion site. You have slight bleeding from the insertion area. Hold pressure on the area. Get help right away if: You have a problem in the insertion area, such as: Severe pain, rapid swelling, or bleeding that does not stop when you apply firm pressure to the area. Fluid, pus or a bad smell coming from your insertion site. The insertion site or limb becomes pale, cool, tingly, or numb. Pain in the limb of the insertion site. You have chest pain. You have trouble breathing. You have any symptoms of a stroke. "BE FAST" is an easy way to remember the main warning signs of a stroke: B - Balance. Signs are dizziness, sudden trouble walking, or loss of balance. E - Eyes. Signs are trouble seeing or a sudden change in vision. F - Face. Signs are sudden weakness or loss of feeling of the face, or the face or eyelid drooping on one side. A - Arms. Signs are weakness or loss of feeling in an arm. This happens suddenly and usually on one side of the  body. S - Speech. Signs are sudden trouble speaking, slurred speech, or trouble understanding what people say. T - Time. Time to call emergency services. Write down what time symptoms started. You have other signs of a stroke, such as: A sudden, severe headache with no known cause. Nausea or vomiting. Seizure. You have severe pain in your hand or leg. These symptoms may be an emergency. Get help right away. Call 911. Do not wait to see if the symptoms will go away. Do not drive yourself to the hospital. This  information is not intended to replace advice given to you by your health care provider. Make sure you discuss any questions you have with your health care provider. Document Revised: 06/06/2022 Document Reviewed: 06/06/2022 Elsevier Patient Education  2024 [employer].

## 2024-04-21 NOTE — H&P (View-Only) (Signed)
 MRN : [id number]  Heather Lopez is a 78 y.o. (May 21, 1946) female who presents with chief complaint of  Chief Complaint  Patient presents with   Venous Insufficiency    fu 3 months + HDA see FB  .  History of Present Illness: Patient returns today in follow up of her dialysis access.  She had a left brachiocephalic AV fistula created earlier this year.  This seemed to mature well and about 2 weeks ago they began using it for dialysis.  The first treatment went very well without any issues.  The second treatment resulted in a very large infiltration and she still has some residual bruising from this.  They went back to using her catheter and have been using that for the last 2 weeks.  We did a duplex today to evaluate the fistula and saw what appeared to be a hemodynamically significant stenosis in the mid upper arm cephalic vein near the area of infiltration.  Current Outpatient Medications  Medication Sig Dispense Refill   ascorbic acid  (VITAMIN C) 500 MG tablet Take 500 mg by mouth daily.     aspirin  81 MG EC tablet Take 81 mg by mouth daily.     atorvastatin  (LIPITOR) 20 MG tablet Take 20 mg by mouth at bedtime.     buPROPion  ER (WELLBUTRIN  SR) 100 MG 12 hr tablet Take 100 mg by mouth daily.     busPIRone  (BUSPAR ) 5 MG tablet Take 5 mg by mouth daily.     calcium -vitamin D  (OSCAL WITH D) 500-5 MG-MCG tablet Take 1 tablet by mouth daily in the afternoon.     cloNIDine  (CATAPRES ) 0.1 MG tablet Take 1 tablet (0.1 mg total) by mouth 2 (two) times daily. 60 tablet 11   cyanocobalamin  (VITAMIN B12) 1000 MCG tablet Take 1,000 mcg by mouth daily.     Dulaglutide  3 MG/0.5ML SOAJ Inject 3 mg into the skin.     ferrous sulfate  325 (65 FE) MG tablet Take 325 mg by mouth daily with breakfast.     folic acid  (FOLVITE ) 1 MG tablet Take 1 mg by mouth daily.     HYDROcodone -acetaminophen  (NORCO/VICODIN) 5-325 MG tablet Take 1 tablet by mouth every 4 (four) hours as needed.     insulin  aspart (NOVOLOG ) 100  UNIT/ML injection Inject 3 Units into the skin 3 (three) times daily before meals. (Patient taking differently: Inject 9 Units into the skin in the morning and at bedtime.) 10 mL 11   insulin  glargine (LANTUS ) 100 UNIT/ML injection Inject 0.15 mLs (15 Units total) into the skin at bedtime. (Patient taking differently: Inject 20 Units into the skin daily.) 10 mL 11   irbesartan (AVAPRO) 150 MG tablet SMARTSIG:1 Tablet(s) By Mouth Every Evening     loperamide  (IMODIUM  A-D) 2 MG tablet Take 2 mg by mouth every 6 (six) hours as needed for diarrhea or loose stools.     magnesium  30 MG tablet Take 30 mg by mouth daily.     metoprolol  tartrate (LOPRESSOR ) 100 MG tablet Take 1 tablet (100 mg total) by mouth 2 (two) times daily. 60 tablet 2   Multiple Vitamin (MULTIVITAMIN WITH MINERALS) TABS tablet Take 1 tablet by mouth daily. 30 tablet 3   polyethylene glycol (MIRALAX  / GLYCOLAX ) 17 g packet Take 17 g by mouth daily as needed for mild constipation or moderate constipation.     senna-docusate (SENOKOT-S) 8.6-50 MG tablet Take 1 tablet by mouth at bedtime as needed for mild constipation or moderate  constipation.     sodium bicarbonate  650 MG tablet Take 650 mg by mouth.     traMADol  (ULTRAM ) 50 MG tablet Take 1 tablet (50 mg total) by mouth every 6 (six) hours as needed. 20 tablet 0   lactulose  (CHRONULAC ) 10 GM/15ML solution Take 45 mLs (30 g total) by mouth 2 (two) times daily as needed for mild constipation or moderate constipation. (Patient not taking: Reported on 04/21/2024) 236 mL 0   sevelamer carbonate (RENVELA) 800 MG tablet Take 1,600 mg by mouth 3 (three) times daily. (Patient not taking: Reported on 04/21/2024)     No current facility-administered medications for this visit.    Past Medical History:  Diagnosis Date   Acute renal failure (HCC) 06/27/2021   Anemia in chronic kidney disease    Anxiety    Aortic atherosclerosis (HCC)    Cataract    Depression    Dysrhythmia    ESRD on  dialysis (HCC)    GERD (gastroesophageal reflux disease)    Gout    History of kidney stones    Hyperlipidemia    Hypertension, essential    Nausea & vomiting 12/02/2021   Obesity    Obstruction of left ureteropelvic junction (UPJ) due to stone 06/2021   Osteoarthritis    Perforated duodenal ulcer (HCC) 06/27/2021   Pyonephrosis 06/2021   left   Septic shock (HCC) 06/27/2021   Spontaneous bacterial peritonitis (HCC)    Subdural hematoma (HCC) 08/2023   Type 2 diabetes mellitus with stage 5 chronic kidney disease (HCC)    Vertigo     Past Surgical History:  Procedure Laterality Date   AV FISTULA PLACEMENT Left 01/09/2024   Procedure: ARTERIOVENOUS (AV) FISTULA CREATION (BRACHIAL CEPHALIC);  Surgeon: Celso College, MD;  Location: [facility];  Service: Vascular;  Laterality: Left;   CAPD REMOVAL N/A 08/02/2023   Procedure: CONTINUOUS AMBULATORY PERITONEAL DIALYSIS  (CAPD) CATHETER REMOVAL;  Surgeon: Alben Alma, MD;  Location: [facility];  Service: General;  Laterality: N/A;   CATARACT EXTRACTION, BILATERAL Bilateral 2019   COLONOSCOPY WITH PROPOFOL  N/A 08/19/2015   Procedure: COLONOSCOPY WITH PROPOFOL ;  Surgeon: Marnee Sink, MD;  Location: [facility];  Service: Endoscopy;  Laterality: N/A;  diabetic - insulin    CYSTOSCOPY W/ RETROGRADES Bilateral 12/22/2019   Procedure: CYSTOSCOPY WITH RETROGRADE PYELOGRAM;  Surgeon: Geraline Knapp, MD;  Location: [facility];  Service: Urology;  Laterality: Bilateral;   CYSTOSCOPY W/ URETERAL STENT PLACEMENT Bilateral 07/05/2021   Procedure: CYSTOSCOPY WITH RETROGRADE PYELOGRAM/BILATERAL URETERAL STENT PLACEMENT;  Surgeon: Geraline Knapp, MD;  Location: [facility];  Service: Urology;  Laterality: Bilateral;   CYSTOSCOPY W/ URETERAL STENT PLACEMENT Bilateral 12/26/2021   Procedure: CYSTOSCOPY WITH STENT REMOVAL;  Surgeon: Geraline Knapp, MD;  Location: [facility];  Service: Urology;  Laterality: Bilateral;   CYSTOSCOPY W/ URETERAL STENT  PLACEMENT Right 03/13/2022   Procedure: CYSTOSCOPY WITH RETROGRADE PYELOGRAM/URETERAL STENT PLACEMENT;  Surgeon: Geraline Knapp, MD;  Location: [facility];  Service: Urology;  Laterality: Right;   CYSTOSCOPY W/ URETERAL STENT PLACEMENT Right 01/01/2023   Procedure: CYSTOSCOPY WITH STENT EXCHANGE;  Surgeon: Geraline Knapp, MD;  Location: [facility];  Service: Urology;  Laterality: Right;   CYSTOSCOPY W/ URETERAL STENT REMOVAL Right 05/08/2022   Procedure: CYSTOSCOPY WITH STENT REMOVAL;  Surgeon: Geraline Knapp, MD;  Location: [facility];  Service: Urology;  Laterality: Right;   CYSTOSCOPY WITH BIOPSY N/A 12/22/2019   Procedure: CYSTOSCOPY WITH bladder BIOPSY;  Surgeon: Geraline Knapp, MD;  Location: [facility];  Service: Urology;  Laterality: N/A;   CYSTOSCOPY WITH BIOPSY  03/13/2022   Procedure: CYSTOSCOPY WITH BLADDER BIOPSY;  Surgeon: Geraline Knapp, MD;  Location: [facility];  Service: Urology;;   CYSTOSCOPY WITH STENT PLACEMENT Right 12/22/2019   Procedure: CYSTOSCOPY WITH STENT PLACEMENT;  Surgeon: Geraline Knapp, MD;  Location: [facility];  Service: Urology;  Laterality: Right;   CYSTOSCOPY WITH STENT PLACEMENT Right 07/03/2022   Procedure: CYSTOSCOPY/RIGHT URETEROSCOPY WITH STENT PLACEMENT;  Surgeon: Geraline Knapp, MD;  Location: [facility];  Service: Urology;  Laterality: Right;   CYSTOSCOPY WITH URETEROSCOPY Bilateral 12/22/2019   Procedure: CYSTOSCOPY WITH URETEROSCOPY;  Surgeon: Geraline Knapp, MD;  Location: [facility];  Service: Urology;  Laterality: Bilateral;   CYSTOSCOPY/URETEROSCOPY/HOLMIUM LASER/STENT PLACEMENT Right 12/22/2019   Procedure: CYSTOSCOPY/URETEROSCOPY/HOLMIUM LASER/STENT PLACEMENT;  Surgeon: Geraline Knapp, MD;  Location: [facility];  Service: Urology;  Laterality: Right;   CYSTOSCOPY/URETEROSCOPY/HOLMIUM LASER/STENT PLACEMENT Bilateral 12/08/2021   Procedure: CYSTOSCOPY/URETEROSCOPY/HOLMIUM LASER/STENT PLACEMENT;  Surgeon: Lawerence Pressman, MD;  Location: [facility];   Service: Urology;  Laterality: Bilateral;   DIALYSIS/PERMA CATHETER INSERTION N/A 06/29/2021   Procedure: DIALYSIS/PERMA CATHETER INSERTION;  Surgeon: Celso College, MD;  Location: [facility];  Service: Cardiovascular;  Laterality: N/A;   DIALYSIS/PERMA CATHETER INSERTION N/A 07/13/2021   Procedure: DIALYSIS/PERMA CATHETER INSERTION;  Surgeon: Celso College, MD;  Location: [facility];  Service: Cardiovascular;  Laterality: N/A;   DIALYSIS/PERMA CATHETER INSERTION N/A 08/01/2023   Procedure: DIALYSIS/PERMA CATHETER INSERTION;  Surgeon: Celso College, MD;  Location: [facility];  Service: Cardiovascular;  Laterality: N/A;   EYE SURGERY     INCISIONAL HERNIA REPAIR  06/28/2023   IR GASTROSTOMY TUBE MOD SED  08/01/2021   LAPAROTOMY N/A 06/29/2021   Procedure: EXPLORATORY LAPAROTOMY WITH REPAIR OF DUODENAL PERFORATION;  Surgeon: Emmalene Hare, MD;  Location: [facility];  Service: General;  Laterality: N/A;   POLYPECTOMY  08/19/2015   Procedure: POLYPECTOMY;  Surgeon: Marnee Sink, MD;  Location: [facility];  Service: Endoscopy;;   TUBAL LIGATION  1992     Social History   Tobacco Use   Smoking status: Former    Current packs/day: 0.00    Average packs/day: 0.8 packs/day for 6.0 years (4.5 ttl pk-yrs)    Types: Cigarettes    Start date: 89    Quit date: 61    Years since quitting: 52.4   Smokeless tobacco: Never   Tobacco comments:    quit 1973  Vaping Use   Vaping status: Never Used  Substance Use Topics   Alcohol use: No    Alcohol/week: 0.0 standard drinks of alcohol   Drug use: No       Family History  Problem Relation Age of Onset   Diabetes Father    Cancer Father        lung cancer   Diabetes Brother    Stroke Mother      Allergies  Allergen Reactions   Contrast Media  [Iodinated Contrast Media] Anaphylaxis   Iodine Swelling    (IV only) - angioedema     REVIEW OF SYSTEMS (Negative unless checked)  Constitutional:  [] Weight loss  [] Fever  [] Chills Cardiac: [] Chest pain   [] Chest pressure   [] Palpitations   [] Shortness of breath when laying flat   [] Shortness of breath at rest   [x] Shortness of breath with exertion. Vascular:  [] Pain in legs with walking   [] Pain in legs at rest   []   Pain in legs when laying flat   [] Claudication   [] Pain in feet when walking  [] Pain in feet at rest  [] Pain in feet when laying flat   [] History of DVT   [] Phlebitis   [] Swelling in legs   [] Varicose veins   [] Non-healing ulcers Pulmonary:   [] Uses home oxygen   [] Productive cough   [] Hemoptysis   [] Wheeze  [] COPD   [] Asthma Neurologic:  [] Dizziness  [] Blackouts   [] Seizures   [] History of stroke   [] History of TIA  [] Aphasia   [] Temporary blindness   [] Dysphagia   [] Weakness or numbness in arms   [] Weakness or numbness in legs Musculoskeletal:  [x] Arthritis   [] Joint swelling   [] Joint pain   [] Low back pain Hematologic:  [] Easy bruising  [] Easy bleeding   [] Hypercoagulable state   [x] Anemic   Gastrointestinal:  [] Blood in stool   [] Vomiting blood  [x] Gastroesophageal reflux/heartburn   [] Abdominal pain Genitourinary:  [x] Chronic kidney disease   [] Difficult urination  [] Frequent urination  [] Burning with urination   [] Hematuria Skin:  [] Rashes   [] Ulcers   [] Wounds Psychological:  [x] History of anxiety   []  History of major depression.  Physical Examination  BP 134/69 (BP Location: Right Arm, Patient Position: Sitting, Cuff Size: Large)   Pulse 69   Resp 17  Gen:  WD/WN, NAD Head: [place]/AT, No temporalis wasting. Ear/Nose/Throat: Hearing grossly intact, nares w/o erythema or drainage Eyes: Conjunctiva clear. Sclera non-icteric Neck: Supple.  Trachea midline Pulmonary:  Good air movement, no use of accessory muscles.  Cardiac: RRR, no JVD Vascular: Left brachiocephalic AV fistula does have a good thrill but is somewhat pulsatile.  There is still residual bruising in the mid to upper arm portion. Vessel Right Left  Radial  Palpable Palpable               Musculoskeletal: M/S 5/5 throughout.  No deformity or atrophy. In a wheelchair. Mild LE edema. Neurologic: Sensation grossly intact in extremities.  Symmetrical.  Speech is fluent.  Psychiatric: Judgment intact, Mood & affect appropriate for pt's clinical situation. Dermatologic: No rashes or ulcers noted.  No cellulitis or open wounds.      Labs Recent Results (from the past 2160 hours)  BLADDER SCAN AMB NON-IMAGING     Status: None   Collection Time: 02/07/24  9:22 AM  Result Value Ref Range   Scan Result 56ml     Radiology No results found.  Assessment/Plan  ESRD on dialysis [facility]) We did a duplex today to evaluate the fistula and saw what appeared to be a hemodynamically significant stenosis in the mid upper arm cephalic vein near the area of infiltration. Given this finding, she needs a fistulogram with likely intervention not only to improve the function of the fistula but to maintain its patency and avoid thrombosis.  Risks and benefits of the procedure were discussed with the patient in detail and she is agreeable to proceed.  T2DM (type 2 diabetes mellitus) (HCC) blood glucose control important in reducing the progression of atherosclerotic disease. Also, involved in wound healing. On appropriate medications.   Essential hypertension blood pressure control important in reducing the progression of atherosclerotic disease. On appropriate oral medications.    Mikki Alexander, MD  04/21/2024 11:56 AM    This note was created with Dragon medical transcription system.  Any errors from dictation are purely unintentional

## 2024-04-21 NOTE — Assessment & Plan Note (Signed)
 blood pressure control important in reducing the progression of atherosclerotic disease. On appropriate oral medications.

## 2024-04-21 NOTE — Assessment & Plan Note (Signed)
 blood glucose control important in reducing the progression of atherosclerotic disease. Also, involved in wound healing. On appropriate medications.

## 2024-04-21 NOTE — Telephone Encounter (Signed)
 Spoke with the patient and she is scheduled with Dr. Vonna Guardian for a left arm fistulagram on 04/23/24 with a 8:30 am arrival time to the [facility]. Pre-procedure instructions were discussed and will be sent to Mychart.

## 2024-04-21 NOTE — Progress Notes (Signed)
 MRN : [id number]  Heather Lopez is a 78 y.o. (May 21, 1946) female who presents with chief complaint of  Chief Complaint  Patient presents with   Venous Insufficiency    fu 3 months + HDA see FB  .  History of Present Illness: Patient returns today in follow up of her dialysis access.  She had a left brachiocephalic AV fistula created earlier this year.  This seemed to mature well and about 2 weeks ago they began using it for dialysis.  The first treatment went very well without any issues.  The second treatment resulted in a very large infiltration and she still has some residual bruising from this.  They went back to using her catheter and have been using that for the last 2 weeks.  We did a duplex today to evaluate the fistula and saw what appeared to be a hemodynamically significant stenosis in the mid upper arm cephalic vein near the area of infiltration.  Current Outpatient Medications  Medication Sig Dispense Refill   ascorbic acid  (VITAMIN C) 500 MG tablet Take 500 mg by mouth daily.     aspirin  81 MG EC tablet Take 81 mg by mouth daily.     atorvastatin  (LIPITOR) 20 MG tablet Take 20 mg by mouth at bedtime.     buPROPion  ER (WELLBUTRIN  SR) 100 MG 12 hr tablet Take 100 mg by mouth daily.     busPIRone  (BUSPAR ) 5 MG tablet Take 5 mg by mouth daily.     calcium -vitamin D  (OSCAL WITH D) 500-5 MG-MCG tablet Take 1 tablet by mouth daily in the afternoon.     cloNIDine  (CATAPRES ) 0.1 MG tablet Take 1 tablet (0.1 mg total) by mouth 2 (two) times daily. 60 tablet 11   cyanocobalamin  (VITAMIN B12) 1000 MCG tablet Take 1,000 mcg by mouth daily.     Dulaglutide  3 MG/0.5ML SOAJ Inject 3 mg into the skin.     ferrous sulfate  325 (65 FE) MG tablet Take 325 mg by mouth daily with breakfast.     folic acid  (FOLVITE ) 1 MG tablet Take 1 mg by mouth daily.     HYDROcodone -acetaminophen  (NORCO/VICODIN) 5-325 MG tablet Take 1 tablet by mouth every 4 (four) hours as needed.     insulin  aspart (NOVOLOG ) 100  UNIT/ML injection Inject 3 Units into the skin 3 (three) times daily before meals. (Patient taking differently: Inject 9 Units into the skin in the morning and at bedtime.) 10 mL 11   insulin  glargine (LANTUS ) 100 UNIT/ML injection Inject 0.15 mLs (15 Units total) into the skin at bedtime. (Patient taking differently: Inject 20 Units into the skin daily.) 10 mL 11   irbesartan (AVAPRO) 150 MG tablet SMARTSIG:1 Tablet(s) By Mouth Every Evening     loperamide  (IMODIUM  A-D) 2 MG tablet Take 2 mg by mouth every 6 (six) hours as needed for diarrhea or loose stools.     magnesium  30 MG tablet Take 30 mg by mouth daily.     metoprolol  tartrate (LOPRESSOR ) 100 MG tablet Take 1 tablet (100 mg total) by mouth 2 (two) times daily. 60 tablet 2   Multiple Vitamin (MULTIVITAMIN WITH MINERALS) TABS tablet Take 1 tablet by mouth daily. 30 tablet 3   polyethylene glycol (MIRALAX  / GLYCOLAX ) 17 g packet Take 17 g by mouth daily as needed for mild constipation or moderate constipation.     senna-docusate (SENOKOT-S) 8.6-50 MG tablet Take 1 tablet by mouth at bedtime as needed for mild constipation or moderate  constipation.     sodium bicarbonate  650 MG tablet Take 650 mg by mouth.     traMADol  (ULTRAM ) 50 MG tablet Take 1 tablet (50 mg total) by mouth every 6 (six) hours as needed. 20 tablet 0   lactulose  (CHRONULAC ) 10 GM/15ML solution Take 45 mLs (30 g total) by mouth 2 (two) times daily as needed for mild constipation or moderate constipation. (Patient not taking: Reported on 04/21/2024) 236 mL 0   sevelamer carbonate (RENVELA) 800 MG tablet Take 1,600 mg by mouth 3 (three) times daily. (Patient not taking: Reported on 04/21/2024)     No current facility-administered medications for this visit.    Past Medical History:  Diagnosis Date   Acute renal failure (HCC) 06/27/2021   Anemia in chronic kidney disease    Anxiety    Aortic atherosclerosis (HCC)    Cataract    Depression    Dysrhythmia    ESRD on  dialysis (HCC)    GERD (gastroesophageal reflux disease)    Gout    History of kidney stones    Hyperlipidemia    Hypertension, essential    Nausea & vomiting 12/02/2021   Obesity    Obstruction of left ureteropelvic junction (UPJ) due to stone 06/2021   Osteoarthritis    Perforated duodenal ulcer (HCC) 06/27/2021   Pyonephrosis 06/2021   left   Septic shock (HCC) 06/27/2021   Spontaneous bacterial peritonitis (HCC)    Subdural hematoma (HCC) 08/2023   Type 2 diabetes mellitus with stage 5 chronic kidney disease (HCC)    Vertigo     Past Surgical History:  Procedure Laterality Date   AV FISTULA PLACEMENT Left 01/09/2024   Procedure: ARTERIOVENOUS (AV) FISTULA CREATION (BRACHIAL CEPHALIC);  Surgeon: Celso College, MD;  Location: [facility];  Service: Vascular;  Laterality: Left;   CAPD REMOVAL N/A 08/02/2023   Procedure: CONTINUOUS AMBULATORY PERITONEAL DIALYSIS  (CAPD) CATHETER REMOVAL;  Surgeon: Alben Alma, MD;  Location: [facility];  Service: General;  Laterality: N/A;   CATARACT EXTRACTION, BILATERAL Bilateral 2019   COLONOSCOPY WITH PROPOFOL  N/A 08/19/2015   Procedure: COLONOSCOPY WITH PROPOFOL ;  Surgeon: Marnee Sink, MD;  Location: [facility];  Service: Endoscopy;  Laterality: N/A;  diabetic - insulin    CYSTOSCOPY W/ RETROGRADES Bilateral 12/22/2019   Procedure: CYSTOSCOPY WITH RETROGRADE PYELOGRAM;  Surgeon: Geraline Knapp, MD;  Location: [facility];  Service: Urology;  Laterality: Bilateral;   CYSTOSCOPY W/ URETERAL STENT PLACEMENT Bilateral 07/05/2021   Procedure: CYSTOSCOPY WITH RETROGRADE PYELOGRAM/BILATERAL URETERAL STENT PLACEMENT;  Surgeon: Geraline Knapp, MD;  Location: [facility];  Service: Urology;  Laterality: Bilateral;   CYSTOSCOPY W/ URETERAL STENT PLACEMENT Bilateral 12/26/2021   Procedure: CYSTOSCOPY WITH STENT REMOVAL;  Surgeon: Geraline Knapp, MD;  Location: [facility];  Service: Urology;  Laterality: Bilateral;   CYSTOSCOPY W/ URETERAL STENT  PLACEMENT Right 03/13/2022   Procedure: CYSTOSCOPY WITH RETROGRADE PYELOGRAM/URETERAL STENT PLACEMENT;  Surgeon: Geraline Knapp, MD;  Location: [facility];  Service: Urology;  Laterality: Right;   CYSTOSCOPY W/ URETERAL STENT PLACEMENT Right 01/01/2023   Procedure: CYSTOSCOPY WITH STENT EXCHANGE;  Surgeon: Geraline Knapp, MD;  Location: [facility];  Service: Urology;  Laterality: Right;   CYSTOSCOPY W/ URETERAL STENT REMOVAL Right 05/08/2022   Procedure: CYSTOSCOPY WITH STENT REMOVAL;  Surgeon: Geraline Knapp, MD;  Location: [facility];  Service: Urology;  Laterality: Right;   CYSTOSCOPY WITH BIOPSY N/A 12/22/2019   Procedure: CYSTOSCOPY WITH bladder BIOPSY;  Surgeon: Geraline Knapp, MD;  Location: [facility];  Service: Urology;  Laterality: N/A;   CYSTOSCOPY WITH BIOPSY  03/13/2022   Procedure: CYSTOSCOPY WITH BLADDER BIOPSY;  Surgeon: Geraline Knapp, MD;  Location: [facility];  Service: Urology;;   CYSTOSCOPY WITH STENT PLACEMENT Right 12/22/2019   Procedure: CYSTOSCOPY WITH STENT PLACEMENT;  Surgeon: Geraline Knapp, MD;  Location: [facility];  Service: Urology;  Laterality: Right;   CYSTOSCOPY WITH STENT PLACEMENT Right 07/03/2022   Procedure: CYSTOSCOPY/RIGHT URETEROSCOPY WITH STENT PLACEMENT;  Surgeon: Geraline Knapp, MD;  Location: [facility];  Service: Urology;  Laterality: Right;   CYSTOSCOPY WITH URETEROSCOPY Bilateral 12/22/2019   Procedure: CYSTOSCOPY WITH URETEROSCOPY;  Surgeon: Geraline Knapp, MD;  Location: [facility];  Service: Urology;  Laterality: Bilateral;   CYSTOSCOPY/URETEROSCOPY/HOLMIUM LASER/STENT PLACEMENT Right 12/22/2019   Procedure: CYSTOSCOPY/URETEROSCOPY/HOLMIUM LASER/STENT PLACEMENT;  Surgeon: Geraline Knapp, MD;  Location: [facility];  Service: Urology;  Laterality: Right;   CYSTOSCOPY/URETEROSCOPY/HOLMIUM LASER/STENT PLACEMENT Bilateral 12/08/2021   Procedure: CYSTOSCOPY/URETEROSCOPY/HOLMIUM LASER/STENT PLACEMENT;  Surgeon: Lawerence Pressman, MD;  Location: [facility];   Service: Urology;  Laterality: Bilateral;   DIALYSIS/PERMA CATHETER INSERTION N/A 06/29/2021   Procedure: DIALYSIS/PERMA CATHETER INSERTION;  Surgeon: Celso College, MD;  Location: [facility];  Service: Cardiovascular;  Laterality: N/A;   DIALYSIS/PERMA CATHETER INSERTION N/A 07/13/2021   Procedure: DIALYSIS/PERMA CATHETER INSERTION;  Surgeon: Celso College, MD;  Location: [facility];  Service: Cardiovascular;  Laterality: N/A;   DIALYSIS/PERMA CATHETER INSERTION N/A 08/01/2023   Procedure: DIALYSIS/PERMA CATHETER INSERTION;  Surgeon: Celso College, MD;  Location: [facility];  Service: Cardiovascular;  Laterality: N/A;   EYE SURGERY     INCISIONAL HERNIA REPAIR  06/28/2023   IR GASTROSTOMY TUBE MOD SED  08/01/2021   LAPAROTOMY N/A 06/29/2021   Procedure: EXPLORATORY LAPAROTOMY WITH REPAIR OF DUODENAL PERFORATION;  Surgeon: Emmalene Hare, MD;  Location: [facility];  Service: General;  Laterality: N/A;   POLYPECTOMY  08/19/2015   Procedure: POLYPECTOMY;  Surgeon: Marnee Sink, MD;  Location: [facility];  Service: Endoscopy;;   TUBAL LIGATION  1992     Social History   Tobacco Use   Smoking status: Former    Current packs/day: 0.00    Average packs/day: 0.8 packs/day for 6.0 years (4.5 ttl pk-yrs)    Types: Cigarettes    Start date: 89    Quit date: 61    Years since quitting: 52.4   Smokeless tobacco: Never   Tobacco comments:    quit 1973  Vaping Use   Vaping status: Never Used  Substance Use Topics   Alcohol use: No    Alcohol/week: 0.0 standard drinks of alcohol   Drug use: No       Family History  Problem Relation Age of Onset   Diabetes Father    Cancer Father        lung cancer   Diabetes Brother    Stroke Mother      Allergies  Allergen Reactions   Contrast Media  [Iodinated Contrast Media] Anaphylaxis   Iodine Swelling    (IV only) - angioedema     REVIEW OF SYSTEMS (Negative unless checked)  Constitutional:  [] Weight loss  [] Fever  [] Chills Cardiac: [] Chest pain   [] Chest pressure   [] Palpitations   [] Shortness of breath when laying flat   [] Shortness of breath at rest   [x] Shortness of breath with exertion. Vascular:  [] Pain in legs with walking   [] Pain in legs at rest   []   Pain in legs when laying flat   [] Claudication   [] Pain in feet when walking  [] Pain in feet at rest  [] Pain in feet when laying flat   [] History of DVT   [] Phlebitis   [] Swelling in legs   [] Varicose veins   [] Non-healing ulcers Pulmonary:   [] Uses home oxygen   [] Productive cough   [] Hemoptysis   [] Wheeze  [] COPD   [] Asthma Neurologic:  [] Dizziness  [] Blackouts   [] Seizures   [] History of stroke   [] History of TIA  [] Aphasia   [] Temporary blindness   [] Dysphagia   [] Weakness or numbness in arms   [] Weakness or numbness in legs Musculoskeletal:  [x] Arthritis   [] Joint swelling   [] Joint pain   [] Low back pain Hematologic:  [] Easy bruising  [] Easy bleeding   [] Hypercoagulable state   [x] Anemic   Gastrointestinal:  [] Blood in stool   [] Vomiting blood  [x] Gastroesophageal reflux/heartburn   [] Abdominal pain Genitourinary:  [x] Chronic kidney disease   [] Difficult urination  [] Frequent urination  [] Burning with urination   [] Hematuria Skin:  [] Rashes   [] Ulcers   [] Wounds Psychological:  [x] History of anxiety   []  History of major depression.  Physical Examination  BP 134/69 (BP Location: Right Arm, Patient Position: Sitting, Cuff Size: Large)   Pulse 69   Resp 17  Gen:  WD/WN, NAD Head: [place]/AT, No temporalis wasting. Ear/Nose/Throat: Hearing grossly intact, nares w/o erythema or drainage Eyes: Conjunctiva clear. Sclera non-icteric Neck: Supple.  Trachea midline Pulmonary:  Good air movement, no use of accessory muscles.  Cardiac: RRR, no JVD Vascular: Left brachiocephalic AV fistula does have a good thrill but is somewhat pulsatile.  There is still residual bruising in the mid to upper arm portion. Vessel Right Left  Radial  Palpable Palpable               Musculoskeletal: M/S 5/5 throughout.  No deformity or atrophy. In a wheelchair. Mild LE edema. Neurologic: Sensation grossly intact in extremities.  Symmetrical.  Speech is fluent.  Psychiatric: Judgment intact, Mood & affect appropriate for pt's clinical situation. Dermatologic: No rashes or ulcers noted.  No cellulitis or open wounds.      Labs Recent Results (from the past 2160 hours)  BLADDER SCAN AMB NON-IMAGING     Status: None   Collection Time: 02/07/24  9:22 AM  Result Value Ref Range   Scan Result 56ml     Radiology No results found.  Assessment/Plan  ESRD on dialysis [facility]) We did a duplex today to evaluate the fistula and saw what appeared to be a hemodynamically significant stenosis in the mid upper arm cephalic vein near the area of infiltration. Given this finding, she needs a fistulogram with likely intervention not only to improve the function of the fistula but to maintain its patency and avoid thrombosis.  Risks and benefits of the procedure were discussed with the patient in detail and she is agreeable to proceed.  T2DM (type 2 diabetes mellitus) (HCC) blood glucose control important in reducing the progression of atherosclerotic disease. Also, involved in wound healing. On appropriate medications.   Essential hypertension blood pressure control important in reducing the progression of atherosclerotic disease. On appropriate oral medications.    Mikki Alexander, MD  04/21/2024 11:56 AM    This note was created with Dragon medical transcription system.  Any errors from dictation are purely unintentional

## 2024-04-23 ENCOUNTER — Ambulatory Visit

## 2024-04-23 ENCOUNTER — Ambulatory Visit
Admission: RE | Admit: 2024-04-23 | Discharge: 2024-04-23 | Disposition: A | Discharge status: Home / Self Care | Attending: Vascular Surgery | Admitting: Vascular Surgery

## 2024-04-23 ENCOUNTER — Encounter: Payer: Self-pay | Admitting: Vascular Surgery

## 2024-04-23 ENCOUNTER — Other Ambulatory Visit: Payer: Self-pay

## 2024-04-23 ENCOUNTER — Encounter
Admission: RE | Disposition: A | Discharge status: Home / Self Care | Payer: Self-pay | Source: Home / Self Care | Attending: Vascular Surgery

## 2024-04-23 DIAGNOSIS — Y832 Surgical operation with anastomosis, bypass or graft as the cause of abnormal reaction of the patient, or of later complication, without mention of misadventure at the time of the procedure: Secondary | ICD-10-CM | POA: Diagnosis not present

## 2024-04-23 DIAGNOSIS — T82858A Stenosis of vascular prosthetic devices, implants and grafts, initial encounter: Secondary | ICD-10-CM

## 2024-04-23 DIAGNOSIS — Z87891 Personal history of nicotine dependence: Secondary | ICD-10-CM | POA: Diagnosis not present

## 2024-04-23 DIAGNOSIS — Z992 Dependence on renal dialysis: Secondary | ICD-10-CM

## 2024-04-23 DIAGNOSIS — I120 Hypertensive chronic kidney disease with stage 5 chronic kidney disease or end stage renal disease: Secondary | ICD-10-CM | POA: Diagnosis not present

## 2024-04-23 DIAGNOSIS — I872 Venous insufficiency (chronic) (peripheral): Secondary | ICD-10-CM | POA: Diagnosis not present

## 2024-04-23 DIAGNOSIS — Z7985 Long-term (current) use of injectable non-insulin antidiabetic drugs: Secondary | ICD-10-CM | POA: Diagnosis not present

## 2024-04-23 DIAGNOSIS — E1122 Type 2 diabetes mellitus with diabetic chronic kidney disease: Secondary | ICD-10-CM | POA: Insufficient documentation

## 2024-04-23 DIAGNOSIS — N186 End stage renal disease: Secondary | ICD-10-CM

## 2024-04-23 DIAGNOSIS — Z794 Long term (current) use of insulin: Secondary | ICD-10-CM | POA: Insufficient documentation

## 2024-04-23 HISTORY — PX: A/V FISTULAGRAM: CATH118298

## 2024-04-23 LAB — POTASSIUM (ARMC VASCULAR LAB ONLY): Potassium (ARMC vascular lab): 4.4 mmol/L (ref 3.5–5.1)

## 2024-04-23 LAB — GLUCOSE, CAPILLARY: Glucose-Capillary: 132 mg/dL — ABNORMAL HIGH (ref 70–99)

## 2024-04-23 SURGERY — A/V FISTULAGRAM
Anesthesia: Moderate Sedation | Laterality: Left

## 2024-04-23 MED ORDER — FAMOTIDINE 20 MG PO TABS
40.0000 mg | ORAL_TABLET | Freq: Once | ORAL | Status: AC | PRN
  Administered 2024-04-23: 40 mg via ORAL

## 2024-04-23 MED ORDER — LIDOCAINE-EPINEPHRINE (PF) 1 %-1:200000 IJ SOLN
INTRAMUSCULAR | Status: DC | PRN
  Administered 2024-04-23: 10 mL

## 2024-04-23 MED ORDER — FENTANYL CITRATE (PF) 100 MCG/2ML IJ SOLN
INTRAMUSCULAR | Status: AC
Start: 2024-04-23 — End: ?
  Filled 2024-04-23: qty 2

## 2024-04-23 MED ORDER — CEFAZOLIN SODIUM-DEXTROSE 1-4 GM/50ML-% IV SOLN
INTRAVENOUS | Status: AC
  Filled 2024-04-23: qty 50

## 2024-04-23 MED ORDER — HYDROMORPHONE HCL 1 MG/ML IJ SOLN: 1.0000 mg | Freq: Once | INTRAMUSCULAR | Status: DC | PRN

## 2024-04-23 MED ORDER — CEFAZOLIN SODIUM-DEXTROSE 1-4 GM/50ML-% IV SOLN: 1.0000 g | INTRAVENOUS | Status: DC

## 2024-04-23 MED ORDER — ONDANSETRON HCL 4 MG/2ML IJ SOLN: 4.0000 mg | Freq: Four times a day (QID) | INTRAMUSCULAR | Status: DC | PRN

## 2024-04-23 MED ORDER — DIPHENHYDRAMINE HCL 50 MG/ML IJ SOLN
INTRAMUSCULAR | Status: AC
Start: 2024-04-23 — End: ?
  Filled 2024-04-23: qty 1

## 2024-04-23 MED ORDER — HEPARIN SODIUM (PORCINE) 1000 UNIT/ML IJ SOLN
INTRAMUSCULAR | Status: AC
  Filled 2024-04-23: qty 10

## 2024-04-23 MED ORDER — SODIUM CHLORIDE 0.9 % IV SOLN
INTRAVENOUS | Status: DC
Start: 2024-04-23 — End: 2024-04-23

## 2024-04-23 MED ORDER — IODIXANOL 320 MG/ML IV SOLN
INTRAVENOUS | Status: DC | PRN
  Administered 2024-04-23: 25 mL

## 2024-04-23 MED ORDER — HEPARIN (PORCINE) IN NACL 1000-0.9 UT/500ML-% IV SOLN
INTRAVENOUS | Status: DC | PRN
  Administered 2024-04-23: 500 mL

## 2024-04-23 MED ORDER — FAMOTIDINE 20 MG PO TABS
ORAL_TABLET | ORAL | Status: AC
Start: 2024-04-23 — End: ?
  Filled 2024-04-23: qty 2

## 2024-04-23 MED ORDER — FENTANYL CITRATE (PF) 100 MCG/2ML IJ SOLN
INTRAMUSCULAR | Status: DC | PRN
  Administered 2024-04-23 (×2): 25 ug via INTRAVENOUS
  Administered 2024-04-23: 50 ug via INTRAVENOUS

## 2024-04-23 MED ORDER — METHYLPREDNISOLONE SODIUM SUCC 125 MG IJ SOLR
INTRAMUSCULAR | Status: AC
  Filled 2024-04-23: qty 2

## 2024-04-23 MED ORDER — METHYLPREDNISOLONE SODIUM SUCC 125 MG IJ SOLR
125.0000 mg | Freq: Once | INTRAMUSCULAR | Status: AC | PRN
  Administered 2024-04-23: 125 mg via INTRAVENOUS

## 2024-04-23 MED ORDER — MIDAZOLAM HCL 2 MG/ML PO SYRP: 8.0000 mg | ORAL_SOLUTION | Freq: Once | ORAL | Status: DC | PRN

## 2024-04-23 MED ORDER — HEPARIN SODIUM (PORCINE) 1000 UNIT/ML IJ SOLN
INTRAMUSCULAR | Status: DC | PRN
Start: 2024-04-23 — End: 2024-04-23
  Administered 2024-04-23: 3000 [IU] via INTRAVENOUS

## 2024-04-23 MED ORDER — DIPHENHYDRAMINE HCL 50 MG/ML IJ SOLN
50.0000 mg | Freq: Once | INTRAMUSCULAR | Status: AC | PRN
  Administered 2024-04-23: 50 mg via INTRAVENOUS

## 2024-04-23 MED ORDER — MIDAZOLAM HCL 5 MG/5ML IJ SOLN
INTRAMUSCULAR | Status: AC
  Filled 2024-04-23: qty 5

## 2024-04-23 MED ORDER — MIDAZOLAM HCL 2 MG/2ML IJ SOLN
INTRAMUSCULAR | Status: DC | PRN
  Administered 2024-04-23: 1 mg via INTRAVENOUS

## 2024-04-23 MED ORDER — ALBUTEROL SULFATE (2.5 MG/3ML) 0.083% IN NEBU
2.5000 mg | INHALATION_SOLUTION | Freq: Once | RESPIRATORY_TRACT | Status: AC
  Administered 2024-04-23: 2.5 mg via RESPIRATORY_TRACT
  Filled 2024-04-23: qty 3

## 2024-04-23 SURGICAL SUPPLY — 13 items
BALLOON DORADO 6X40X80 (BALLOONS) IMPLANT
BALLOON LUTONIX DCB 7X60X130 (BALLOONS) IMPLANT
COVER PROBE ULTRASOUND 5X96 (MISCELLANEOUS) IMPLANT
DEVICE PRESTO INFLATION (MISCELLANEOUS) IMPLANT
DRAPE BRACHIAL (DRAPES) IMPLANT
KIT MICROPUNCTURE VSI 5F STIFF (SHEATH) IMPLANT
PACK ANGIOGRAPHY (CUSTOM PROCEDURE TRAY) ×1 IMPLANT
SHEATH BRITE TIP 6FRX5.5 (SHEATH) IMPLANT
SHEATH BRITE TIP 7FRX5.5 (SHEATH) IMPLANT
STENT VIABAHN 8X50X120 (Permanent Stent) IMPLANT
SUT MNCRL AB 4-0 PS2 18 (SUTURE) IMPLANT
WIRE G 018X200 V18 (WIRE) IMPLANT
WIRE SUPRACORE 190CM (WIRE) IMPLANT

## 2024-04-23 NOTE — Progress Notes (Signed)
 Difficulty weaning patient off of supplemental oxygen s/p fistulogram. Pt endorses difficulty taking a deep breath due to a severe cold x2-3 days. Lungs clear, diminished. No shortness of breath noted. Vonna Guardian, MD and Valeta Gaudier, NP notified; cxr ordered and albuterol  nebulizer administered. Pt states breathing is improved s/p treatment. CXR reviewed by NP Pace. Sats >93% on room air. Per NP Pace, ok to d/c pt with incentive spirometer. Education provided on incentive spirometer with excellent teach back. Pt directed to use 10x per hour while awake. Pt and spouse both instructed to seek medical attention if ongoing cold symptoms worsen or do not improved in the next few days.

## 2024-04-23 NOTE — Op Note (Signed)
 [facility]    OPERATIVE NOTE   PROCEDURE: 1.   Left brachiocephalic arteriovenous fistula cannulation under ultrasound guidance 2.   Left arm fistulagram including central venogram 3.   Percutaneous transluminal angioplasty of mid upper arm cephalic vein stenosis with 7 mm diameter Lutonix drug-coated and 6 mm diameter high-pressure angioplasty balloons 4.   Stent placement to the mid upper arm cephalic vein stenosis with 8 mm diameter by 5 cm length Viabahn stent for greater than 50% residual stenosis after angioplasty  PRE-OPERATIVE DIAGNOSIS: 1. ESRD 2. Poorly functional left brachiocephalic AVF  POST-OPERATIVE DIAGNOSIS: same as above   SURGEON: Mikki Alexander, MD  ANESTHESIA: local with MCS  ESTIMATED BLOOD LOSS: 5 cc  FINDING(S): 90% stenosis of the mid upper arm cephalic vein portion of the left brachiocephalic AV fistula.  The remainder of the fistula and central venous circulation appear to be widely patent.  SPECIMEN(S):  None  CONTRAST: 25 cc  FLUORO TIME: 2.3 minutes  MODERATE CONSCIOUS SEDATION TIME: Approximately 27 minutes with 1 mg of Versed  and 100 mcg of Fentanyl    INDICATIONS: Heather Lopez is a 78 y.o. female who presents with malfunctioning left brachiocephalic arteriovenous fistula.  The patient is scheduled for left arm fistulagram.  The patient is aware the risks include but are not limited to: bleeding, infection, thrombosis of the cannulated access, and possible anaphylactic reaction to the contrast.  The patient is aware of the risks of the procedure and elects to proceed forward.  DESCRIPTION: After full informed written consent was obtained, the patient was brought back to the angiography suite and placed supine upon the angiography table.  The patient was connected to monitoring equipment. Moderate conscious sedation was administered with a face to face encounter with the patient throughout the procedure with my supervision of the  RN administering medicines and monitoring the patient's vital signs and mental status throughout from the start of the procedure until the patient was taken to the recovery room. The left arm was prepped and draped in the standard fashion for a percutaneous access intervention.  Under ultrasound guidance, the left brachiocephalic arteriovenous fistula was cannulated with a micropuncture needle under direct ultrasound guidance where it was patent and a permanent image was performed.  The microwire was advanced into the fistula and the needle was exchanged for the a microsheath.  I then upsized to a 6 Fr Sheath and imaging was performed.  Hand injections were completed to image the access including the central venous system. This demonstrated 90% stenosis of the mid upper arm cephalic vein portion of the left brachiocephalic AV fistula.  The remainder of the fistula and central venous circulation appear to be widely patent.  Based on the images, this patient will need intervention to this mid upper arm cephalic vein stenosis to make the fistula more functional and avoid thrombosis. I then gave the patient 3000 units of intravenous heparin .  I then crossed the stenosis with a Supracore wire.  Based on the imaging, a 7 mm x 6 cm Lutonix drug-coated angioplasty balloon was selected.  The balloon was centered around the mid upper arm cephalic vein stenosis and inflated to burst inflation of 12 ATM for 1 minute(s) but the waist did not resolve.  I then changed for a 6 mm diameter by 4 cm length high-pressure angioplasty balloon and at this size the waist resolved with angioplasty.  On completion imaging, a greater than 50% residual stenosis was present.  I elected to  place a stent.  I then upsized to a 7 [place] sheath and crossed the lesion with a 0.018 wire.  An 8 mm diameter by 5 cm length Viabahn stent was then selected and deployed and postdilated with a 7 mm balloon with excellent angiographic completion result and  no significant residual stenosis   Based on the completion imaging, no further intervention is necessary.  The wire and balloon were removed from the sheath.  A 4-0 Monocryl purse-string suture was sewn around the sheath.  The sheath was removed while tying down the suture.  A sterile bandage was applied to the puncture site.  COMPLICATIONS: None  CONDITION: Stable   Mikki Alexander  04/23/2024 9:51 AM   This note was created with Dragon Medical transcription system. Any errors in dictation are purely unintentional.

## 2024-04-23 NOTE — Interval H&P Note (Signed)
 History and Physical Interval Note:  04/23/2024 8:54 AM  Heather Lopez  has presented today for surgery, with the diagnosis of L Arm Fistulagram    End Stage Renal.  The various methods of treatment have been discussed with the patient and family. After consideration of risks, benefits and other options for treatment, the patient has consented to  Procedure(s): A/V Fistulagram (Left) as a surgical intervention.  The patient's history has been reviewed, patient examined, no change in status, stable for surgery.  I have reviewed the patient's chart and labs.  Questions were answered to the patient's satisfaction.     Nery Kalisz

## 2024-05-27 ENCOUNTER — Telehealth (INDEPENDENT_AMBULATORY_CARE_PROVIDER_SITE_OTHER): Payer: Self-pay

## 2024-05-27 NOTE — Telephone Encounter (Signed)
I attempted to contact the patient to schedule her for a permcath removal. A message was left for a return call.

## 2024-06-02 ENCOUNTER — Telehealth (INDEPENDENT_AMBULATORY_CARE_PROVIDER_SITE_OTHER): Payer: Self-pay

## 2024-06-02 NOTE — Telephone Encounter (Signed)
 Patient called back and is scheduled with Dr. Marea for a permcath removal on 06/18/24 with a 2:00 pm arrival time to the [facility]. Pre-procedure instructions were discussed and will be sen to Mychart and mailed. Patient was offered 06/08/24 and declined.

## 2024-06-02 NOTE — Telephone Encounter (Signed)
 I attempted to return the patient's call and a message was left for a return call.

## 2024-06-03 ENCOUNTER — Other Ambulatory Visit (INDEPENDENT_AMBULATORY_CARE_PROVIDER_SITE_OTHER): Payer: Self-pay | Admitting: Vascular Surgery

## 2024-06-03 DIAGNOSIS — N186 End stage renal disease: Secondary | ICD-10-CM

## 2024-06-04 ENCOUNTER — Encounter (INDEPENDENT_AMBULATORY_CARE_PROVIDER_SITE_OTHER): Payer: Self-pay | Admitting: Nurse Practitioner

## 2024-06-04 ENCOUNTER — Ambulatory Visit (INDEPENDENT_AMBULATORY_CARE_PROVIDER_SITE_OTHER)

## 2024-06-04 ENCOUNTER — Ambulatory Visit (INDEPENDENT_AMBULATORY_CARE_PROVIDER_SITE_OTHER): Admitting: Nurse Practitioner

## 2024-06-04 VITALS — BP 132/77 | HR 82 | Resp 18 | Ht 63.0 in | Wt 210.0 lb

## 2024-06-04 DIAGNOSIS — E1122 Type 2 diabetes mellitus with diabetic chronic kidney disease: Secondary | ICD-10-CM

## 2024-06-04 DIAGNOSIS — Z794 Long term (current) use of insulin: Secondary | ICD-10-CM

## 2024-06-04 DIAGNOSIS — Z992 Dependence on renal dialysis: Secondary | ICD-10-CM | POA: Diagnosis not present

## 2024-06-04 DIAGNOSIS — N186 End stage renal disease: Secondary | ICD-10-CM | POA: Diagnosis not present

## 2024-06-04 DIAGNOSIS — I10 Essential (primary) hypertension: Secondary | ICD-10-CM

## 2024-06-04 NOTE — Progress Notes (Signed)
 Subjective:    Patient ID: Jenkins FORBES Baptist, female    DOB: Apr 17, 1946, 78 y.o.   MRN: [id number] Chief Complaint  Patient presents with   Follow-up    6 week follow up HDA L arm     The patient returns to the office for followup status post intervention of their dialysis access left brachiocephalic AV fistula that underwent intervention on 04/23/2024.   Following the intervention the access function has significantly improved, with better flow rates and improved KT/V. The patient has not been experiencing increased bleeding times following decannulation and the patient denies increased recirculation. The patient denies an increase in arm swelling. At the present time the patient denies hand pain.  She is scheduled to have her PermCath removed in a couple of weeks.  No recent shortening of the patient's walking distance or new symptoms consistent with claudication.  No history of rest pain symptoms. No new ulcers or wounds of the lower extremities have occurred.  The patient denies amaurosis fugax or recent TIA symptoms. There are no recent neurological changes noted. There is no history of DVT, PE or superficial thrombophlebitis. No recent episodes of angina or shortness of breath documented.   Duplex ultrasound of the AV access shows a patent access.  The previously noted stenosis is improved compared to last study.  Flow volume today is 942 cc/min      Review of Systems  Neurological:  Positive for weakness.  Hematological:  Bruises/bleeds easily.  All other systems reviewed and are negative.      Objective:   Physical Exam Vitals reviewed.  HENT:     Head: Normocephalic.  Cardiovascular:     Rate and Rhythm: Normal rate.     Pulses:          Radial pulses are 2+ on the left side.     Arteriovenous access: Left arteriovenous access is present.     Comments: Good thrill and bruit Pulmonary:     Effort: Pulmonary effort is normal.  Skin:    General: Skin is warm and dry.   Neurological:     Mental Status: She is alert and oriented to person, place, and time.  Psychiatric:        Mood and Affect: Mood normal.        Behavior: Behavior normal.        Thought Content: Thought content normal.        Judgment: Judgment normal.     BP 132/77 (BP Location: Right Arm, Patient Position: Sitting, Cuff Size: Large)   Pulse 82   Resp 18   Ht 5' 3 (1.6 m)   Wt 210 lb (95.3 kg)   BMI 37.20 kg/m   Past Medical History:  Diagnosis Date   Acute renal failure (HCC) 06/27/2021   Anemia in chronic kidney disease    Anxiety    Aortic atherosclerosis (HCC)    Cataract    Depression    Dysrhythmia    ESRD on dialysis (HCC)    GERD (gastroesophageal reflux disease)    Gout    History of kidney stones    Hyperlipidemia    Hypertension, essential    Nausea & vomiting 12/02/2021   Obesity    Obstruction of left ureteropelvic junction (UPJ) due to stone 06/2021   Osteoarthritis    Perforated duodenal ulcer (HCC) 06/27/2021   Pyonephrosis 06/2021   left   Septic shock (HCC) 06/27/2021   Spontaneous bacterial peritonitis (HCC)    Subdural hematoma (  HCC) 08/2023   Type 2 diabetes mellitus with stage 5 chronic kidney disease (HCC)    Vertigo     Social History   Socioeconomic History   Marital status: Married    Spouse name: Elspeth   Number of children: Not on file   Years of education: Not on file   Highest education level: Not on file  Occupational History   Not on file  Tobacco Use   Smoking status: Former    Current packs/day: 0.00    Average packs/day: 0.8 packs/day for 6.0 years (4.5 ttl pk-yrs)    Types: Cigarettes    Start date: 12    Quit date: 43    Years since quitting: 52.5   Smokeless tobacco: Never   Tobacco comments:    quit 1973  Vaping Use   Vaping status: Never Used  Substance and Sexual Activity   Alcohol use: No    Alcohol/week: 0.0 standard drinks of alcohol   Drug use: No   Sexual activity: Not on file  Other  Topics Concern   Not on file  Social History Narrative   Not on file   Social Drivers of Health   Financial Resource Strain: Low Risk  (04/30/2024)   Received from [facility]   Overall Financial Resource Strain (CARDIA)    Difficulty of Paying Living Expenses: Not hard at all  Food Insecurity: No Food Insecurity (04/30/2024)   Received from [facility]   Hunger Vital Sign    Within the past 12 months, you worried that your food would run out before you got the money to buy more.: Never true    Within the past 12 months, the food you bought just didn't last and you didn't have money to get more.: Never true  Transportation Needs: No Transportation Needs (04/30/2024)   Received from [facility] - Transportation    In the past 12 months, has lack of transportation kept you from medical appointments or from getting medications?: No    Lack of Transportation (Non-Medical): No  Physical Activity: Unknown (07/14/2023)   Received from [facility]   Exercise Vital Sign    On average, how many days per week do you engage in moderate to strenuous exercise (like a brisk walk)?: 0 days    Minutes of Exercise per Session: Not on file  Stress: No Stress Concern Present (07/14/2023)   Received from [facility] - Occupational Stress Questionnaire    Feeling of Stress : Not at all  Social Connections: Socially Integrated (07/14/2023)   Received from [facility]   Social Network    How would you rate your social network (family, work, friends)?: Good participation with social networks  Intimate Partner Violence: Not At Risk (09/17/2023)   Humiliation, Afraid, Rape, and Kick questionnaire    Fear of Current or Ex-Partner: No    Emotionally Abused: No    Physically Abused: No    Sexually Abused: No    Past Surgical History:  Procedure Laterality Date   A/V FISTULAGRAM Left 04/23/2024    Procedure: A/V Fistulagram;  Surgeon: Marea Selinda RAMAN, MD;  Location: [facility];  Service: Cardiovascular;  Laterality: Left;   AV FISTULA PLACEMENT Left 01/09/2024   Procedure: ARTERIOVENOUS (AV) FISTULA CREATION (BRACHIAL CEPHALIC);  Surgeon: Marea Selinda RAMAN, MD;  Location: [facility];  Service: Vascular;  Laterality: Left;   CAPD REMOVAL N/A 08/02/2023  Procedure: CONTINUOUS AMBULATORY PERITONEAL DIALYSIS  (CAPD) CATHETER REMOVAL;  Surgeon: Jordis Laneta FALCON, MD;  Location: [facility];  Service: General;  Laterality: N/A;   CATARACT EXTRACTION, BILATERAL Bilateral 2019   COLONOSCOPY WITH PROPOFOL  N/A 08/19/2015   Procedure: COLONOSCOPY WITH PROPOFOL ;  Surgeon: Rogelia Copping, MD;  Location: [facility];  Service: Endoscopy;  Laterality: N/A;  diabetic - insulin    CYSTOSCOPY W/ RETROGRADES Bilateral 12/22/2019   Procedure: CYSTOSCOPY WITH RETROGRADE PYELOGRAM;  Surgeon: Twylla Glendia BROCKS, MD;  Location: [facility];  Service: Urology;  Laterality: Bilateral;   CYSTOSCOPY W/ URETERAL STENT PLACEMENT Bilateral 07/05/2021   Procedure: CYSTOSCOPY WITH RETROGRADE PYELOGRAM/BILATERAL URETERAL STENT PLACEMENT;  Surgeon: Twylla Glendia BROCKS, MD;  Location: [facility];  Service: Urology;  Laterality: Bilateral;   CYSTOSCOPY W/ URETERAL STENT PLACEMENT Bilateral 12/26/2021   Procedure: CYSTOSCOPY WITH STENT REMOVAL;  Surgeon: Twylla Glendia BROCKS, MD;  Location: [facility];  Service: Urology;  Laterality: Bilateral;   CYSTOSCOPY W/ URETERAL STENT PLACEMENT Right 03/13/2022   Procedure: CYSTOSCOPY WITH RETROGRADE PYELOGRAM/URETERAL STENT PLACEMENT;  Surgeon: Twylla Glendia BROCKS, MD;  Location: [facility];  Service: Urology;  Laterality: Right;   CYSTOSCOPY W/ URETERAL STENT PLACEMENT Right 01/01/2023   Procedure: CYSTOSCOPY WITH STENT EXCHANGE;  Surgeon: Twylla Glendia BROCKS, MD;  Location: [facility];  Service: Urology;  Laterality: Right;   CYSTOSCOPY W/ URETERAL STENT REMOVAL Right 05/08/2022   Procedure: CYSTOSCOPY WITH  STENT REMOVAL;  Surgeon: Twylla Glendia BROCKS, MD;  Location: [facility];  Service: Urology;  Laterality: Right;   CYSTOSCOPY WITH BIOPSY N/A 12/22/2019   Procedure: CYSTOSCOPY WITH bladder BIOPSY;  Surgeon: Twylla Glendia BROCKS, MD;  Location: [facility];  Service: Urology;  Laterality: N/A;   CYSTOSCOPY WITH BIOPSY  03/13/2022   Procedure: CYSTOSCOPY WITH BLADDER BIOPSY;  Surgeon: Twylla Glendia BROCKS, MD;  Location: [facility];  Service: Urology;;   CYSTOSCOPY WITH STENT PLACEMENT Right 12/22/2019   Procedure: CYSTOSCOPY WITH STENT PLACEMENT;  Surgeon: Twylla Glendia BROCKS, MD;  Location: [facility];  Service: Urology;  Laterality: Right;   CYSTOSCOPY WITH STENT PLACEMENT Right 07/03/2022   Procedure: CYSTOSCOPY/RIGHT URETEROSCOPY WITH STENT PLACEMENT;  Surgeon: Twylla Glendia BROCKS, MD;  Location: [facility];  Service: Urology;  Laterality: Right;   CYSTOSCOPY WITH URETEROSCOPY Bilateral 12/22/2019   Procedure: CYSTOSCOPY WITH URETEROSCOPY;  Surgeon: Twylla Glendia BROCKS, MD;  Location: [facility];  Service: Urology;  Laterality: Bilateral;   CYSTOSCOPY/URETEROSCOPY/HOLMIUM LASER/STENT PLACEMENT Right 12/22/2019   Procedure: CYSTOSCOPY/URETEROSCOPY/HOLMIUM LASER/STENT PLACEMENT;  Surgeon: Twylla Glendia BROCKS, MD;  Location: [facility];  Service: Urology;  Laterality: Right;   CYSTOSCOPY/URETEROSCOPY/HOLMIUM LASER/STENT PLACEMENT Bilateral 12/08/2021   Procedure: CYSTOSCOPY/URETEROSCOPY/HOLMIUM LASER/STENT PLACEMENT;  Surgeon: Francisca Redell BROCKS, MD;  Location: [facility];  Service: Urology;  Laterality: Bilateral;   DIALYSIS/PERMA CATHETER INSERTION N/A 06/29/2021   Procedure: DIALYSIS/PERMA CATHETER INSERTION;  Surgeon: Marea Selinda RAMAN, MD;  Location: [facility];  Service: Cardiovascular;  Laterality: N/A;   DIALYSIS/PERMA CATHETER INSERTION N/A 07/13/2021   Procedure: DIALYSIS/PERMA CATHETER INSERTION;  Surgeon: Marea Selinda RAMAN, MD;  Location: [facility];  Service: Cardiovascular;  Laterality: N/A;   DIALYSIS/PERMA  CATHETER INSERTION N/A 08/01/2023   Procedure: DIALYSIS/PERMA CATHETER INSERTION;  Surgeon: Marea Selinda RAMAN, MD;  Location: [facility];  Service: Cardiovascular;  Laterality: N/A;   EYE SURGERY     INCISIONAL HERNIA REPAIR  06/28/2023   IR GASTROSTOMY TUBE MOD SED  08/01/2021   LAPAROTOMY N/A 06/29/2021   Procedure: EXPLORATORY LAPAROTOMY WITH REPAIR OF DUODENAL PERFORATION;  Surgeon: Desiderio Schanz, MD;  Location: [facility];  Service: General;  Laterality: N/A;   POLYPECTOMY  08/19/2015   Procedure: POLYPECTOMY;  Surgeon: Rogelia Copping, MD;  Location: [facility];  Service: Endoscopy;;   TUBAL LIGATION  1992    Family History  Problem Relation Age of Onset   Diabetes Father    Cancer Father        lung cancer   Diabetes Brother    Stroke Mother     Allergies  Allergen Reactions   Contrast Media  [Iodinated Contrast Media] Anaphylaxis   Iodine  Swelling    (IV only) - angioedema       Latest Ref Rng & Units 01/09/2024    6:29 AM 12/03/2023    9:54 AM 12/03/2023    8:58 AM  CBC  WBC 4.0 - 10.5 K/uL  5.9    Hemoglobin 12.0 - 15.0 g/dL 86.0  89.4  88.7   Hematocrit 36.0 - 46.0 % 41.0  33.4  33.0   Platelets 150 - 400 K/uL  232        CMP     Component Value Date/Time   NA 136 01/09/2024 0629   NA 138 10/03/2022 1350   NA 133 (L) 01/29/2014 0406   K 4.0 01/09/2024 0629   K 4.0 01/29/2014 0406   CL 98 01/09/2024 0629   CL 96 (L) 01/29/2014 0406   CO2 22 12/03/2023 0954   CO2 29 01/29/2014 0406   GLUCOSE 332 (H) 01/09/2024 0629   GLUCOSE 377 (H) 01/29/2014 0406   BUN 25 (H) 01/09/2024 0629   BUN 39 (H) 10/03/2022 1350   BUN 31 (H) 01/29/2014 0406   CREATININE 4.00 (H) 01/09/2024 0629   CREATININE 1.17 (H) 03/17/2018 0830   CALCIUM  8.6 (L) 12/03/2023 0954   CALCIUM  9.7 01/29/2014 0406   PROT 7.2 09/17/2023 0530   PROT 6.9 02/10/2022 0939   PROT 8.1 01/27/2014 1318   ALBUMIN  2.6 (L) 09/21/2023 0736   ALBUMIN  3.8 02/10/2022 0939   ALBUMIN  4.1 01/27/2014  1318   AST 36 09/17/2023 0530   AST 36 01/27/2014 1318   ALT 33 09/17/2023 0530   ALT 30 01/27/2014 1318   ALKPHOS 113 09/17/2023 0530   ALKPHOS 110 01/27/2014 1318   BILITOT 0.6 09/17/2023 0530   BILITOT <0.2 02/10/2022 0939   BILITOT 0.5 01/27/2014 1318   EGFR 18 (L) 10/03/2022 1350   GFRNONAA 11 (L) 12/03/2023 0954   GFRNONAA 48 (L) 08/21/2016 0908     No results found.     Assessment & Plan:   1. ESRD on dialysis [facility]) (Primary) Recommend:  The patient is doing well and currently has adequate dialysis access.  Although there are some parameters suggesting possible future issues.  The patient's dialysis center is not reporting any major access issues.  However, there remains an elevated velocities in the area that was repaired.  While these are improved postintervention, this raises concerns that the access is at moderate but not high risk for a problem or thrombosis and should be followed more closely  The patient will follow-up with me in the office in 3 months.  The need for a follow up duplex ultrasound will be made at that time based on whether problems with the access are persistent.    2. Type 2 diabetes mellitus with chronic kidney disease on chronic dialysis, with long-term current use of insulin  (HCC) Continue hypoglycemic medications as already ordered, these medications have been reviewed and there are  no changes at this time.  Hgb A1C to be monitored as already arranged by primary service  3. Essential hypertension Continue antihypertensive medications as already ordered, these medications have been reviewed and there are no changes at this time.   Current Outpatient Medications on File Prior to Visit  Medication Sig Dispense Refill   ascorbic acid  (VITAMIN C) 500 MG tablet Take 500 mg by mouth daily.     aspirin  81 MG EC tablet Take 81 mg by mouth daily.     atorvastatin  (LIPITOR) 20 MG tablet Take 20 mg by mouth at bedtime.     buPROPion  ER (WELLBUTRIN   SR) 100 MG 12 hr tablet Take 100 mg by mouth daily.     busPIRone  (BUSPAR ) 5 MG tablet Take 5 mg by mouth daily.     calcium -vitamin D  (OSCAL WITH D) 500-5 MG-MCG tablet Take 1 tablet by mouth daily in the afternoon.     cloNIDine  (CATAPRES ) 0.1 MG tablet Take 1 tablet (0.1 mg total) by mouth 2 (two) times daily. 60 tablet 11   cyanocobalamin  (VITAMIN B12) 1000 MCG tablet Take 1,000 mcg by mouth daily.     Dulaglutide  3 MG/0.5ML SOAJ Inject 3 mg into the skin.     ferrous sulfate  325 (65 FE) MG tablet Take 325 mg by mouth daily with breakfast.     folic acid  (FOLVITE ) 1 MG tablet Take 1 mg by mouth daily.     insulin  aspart (NOVOLOG ) 100 UNIT/ML injection Inject 3 Units into the skin 3 (three) times daily before meals. (Patient taking differently: Inject 9 Units into the skin in the morning and at bedtime.) 10 mL 11   irbesartan (AVAPRO) 150 MG tablet SMARTSIG:1 Tablet(s) By Mouth Every Evening     loperamide  (IMODIUM  A-D) 2 MG tablet Take 2 mg by mouth every 6 (six) hours as needed for diarrhea or loose stools.     magnesium  30 MG tablet Take 30 mg by mouth daily.     metoprolol  tartrate (LOPRESSOR ) 100 MG tablet Take 1 tablet (100 mg total) by mouth 2 (two) times daily. 60 tablet 2   Multiple Vitamin (MULTIVITAMIN WITH MINERALS) TABS tablet Take 1 tablet by mouth daily. 30 tablet 3   polyethylene glycol (MIRALAX  / GLYCOLAX ) 17 g packet Take 17 g by mouth daily as needed for mild constipation or moderate constipation.     senna-docusate (SENOKOT-S) 8.6-50 MG tablet Take 1 tablet by mouth at bedtime as needed for mild constipation or moderate constipation.     sevelamer carbonate (RENVELA) 800 MG tablet Take 1,600 mg by mouth 3 (three) times daily.     sodium bicarbonate  650 MG tablet Take 650 mg by mouth.     traMADol  (ULTRAM ) 50 MG tablet Take 1 tablet (50 mg total) by mouth every 6 (six) hours as needed. 20 tablet 0   HYDROcodone -acetaminophen  (NORCO/VICODIN) 5-325 MG tablet Take 1 tablet by  mouth every 4 (four) hours as needed. (Patient not taking: Reported on 04/23/2024)     insulin  glargine (LANTUS ) 100 UNIT/ML injection Inject 0.15 mLs (15 Units total) into the skin at bedtime. (Patient taking differently: Inject 20 Units into the skin daily.) 10 mL 11   lactulose  (CHRONULAC ) 10 GM/15ML solution Take 45 mLs (30 g total) by mouth 2 (two) times daily as needed for mild constipation or moderate constipation. (Patient not taking: Reported on 04/21/2024) 236 mL 0   No current facility-administered medications on file prior to visit.    There are no Patient  Instructions on file for this visit. No follow-ups on file.   Anup Brigham E Haden Suder, NP

## 2024-06-04 NOTE — H&P (View-Only) (Signed)
 Subjective:    Patient ID: Heather Lopez, female    DOB: Apr 17, 1946, 78 y.o.   MRN: [id number] Chief Complaint  Patient presents with   Follow-up    6 week follow up HDA L arm     The patient returns to the office for followup status post intervention of their dialysis access left brachiocephalic AV fistula that underwent intervention on 04/23/2024.   Following the intervention the access function has significantly improved, with better flow rates and improved KT/V. The patient has not been experiencing increased bleeding times following decannulation and the patient denies increased recirculation. The patient denies an increase in arm swelling. At the present time the patient denies hand pain.  She is scheduled to have her PermCath removed in a couple of weeks.  No recent shortening of the patient's walking distance or new symptoms consistent with claudication.  No history of rest pain symptoms. No new ulcers or wounds of the lower extremities have occurred.  The patient denies amaurosis fugax or recent TIA symptoms. There are no recent neurological changes noted. There is no history of DVT, PE or superficial thrombophlebitis. No recent episodes of angina or shortness of breath documented.   Duplex ultrasound of the AV access shows a patent access.  The previously noted stenosis is improved compared to last study.  Flow volume today is 942 cc/min      Review of Systems  Neurological:  Positive for weakness.  Hematological:  Bruises/bleeds easily.  All other systems reviewed and are negative.      Objective:   Physical Exam Vitals reviewed.  HENT:     Head: Normocephalic.  Cardiovascular:     Rate and Rhythm: Normal rate.     Pulses:          Radial pulses are 2+ on the left side.     Arteriovenous access: Left arteriovenous access is present.     Comments: Good thrill and bruit Pulmonary:     Effort: Pulmonary effort is normal.  Skin:    General: Skin is warm and dry.   Neurological:     Mental Status: She is alert and oriented to person, place, and time.  Psychiatric:        Mood and Affect: Mood normal.        Behavior: Behavior normal.        Thought Content: Thought content normal.        Judgment: Judgment normal.     BP 132/77 (BP Location: Right Arm, Patient Position: Sitting, Cuff Size: Large)   Pulse 82   Resp 18   Ht 5' 3 (1.6 m)   Wt 210 lb (95.3 kg)   BMI 37.20 kg/m   Past Medical History:  Diagnosis Date   Acute renal failure (HCC) 06/27/2021   Anemia in chronic kidney disease    Anxiety    Aortic atherosclerosis (HCC)    Cataract    Depression    Dysrhythmia    ESRD on dialysis (HCC)    GERD (gastroesophageal reflux disease)    Gout    History of kidney stones    Hyperlipidemia    Hypertension, essential    Nausea & vomiting 12/02/2021   Obesity    Obstruction of left ureteropelvic junction (UPJ) due to stone 06/2021   Osteoarthritis    Perforated duodenal ulcer (HCC) 06/27/2021   Pyonephrosis 06/2021   left   Septic shock (HCC) 06/27/2021   Spontaneous bacterial peritonitis (HCC)    Subdural hematoma (  HCC) 08/2023   Type 2 diabetes mellitus with stage 5 chronic kidney disease (HCC)    Vertigo     Social History   Socioeconomic History   Marital status: Married    Spouse name: Elspeth   Number of children: Not on file   Years of education: Not on file   Highest education level: Not on file  Occupational History   Not on file  Tobacco Use   Smoking status: Former    Current packs/day: 0.00    Average packs/day: 0.8 packs/day for 6.0 years (4.5 ttl pk-yrs)    Types: Cigarettes    Start date: 12    Quit date: 43    Years since quitting: 52.5   Smokeless tobacco: Never   Tobacco comments:    quit 1973  Vaping Use   Vaping status: Never Used  Substance and Sexual Activity   Alcohol use: No    Alcohol/week: 0.0 standard drinks of alcohol   Drug use: No   Sexual activity: Not on file  Other  Topics Concern   Not on file  Social History Narrative   Not on file   Social Drivers of Health   Financial Resource Strain: Low Risk  (04/30/2024)   Received from [facility]   Overall Financial Resource Strain (CARDIA)    Difficulty of Paying Living Expenses: Not hard at all  Food Insecurity: No Food Insecurity (04/30/2024)   Received from [facility]   Hunger Vital Sign    Within the past 12 months, you worried that your food would run out before you got the money to buy more.: Never true    Within the past 12 months, the food you bought just didn't last and you didn't have money to get more.: Never true  Transportation Needs: No Transportation Needs (04/30/2024)   Received from [facility] - Transportation    In the past 12 months, has lack of transportation kept you from medical appointments or from getting medications?: No    Lack of Transportation (Non-Medical): No  Physical Activity: Unknown (07/14/2023)   Received from [facility]   Exercise Vital Sign    On average, how many days per week do you engage in moderate to strenuous exercise (like a brisk walk)?: 0 days    Minutes of Exercise per Session: Not on file  Stress: No Stress Concern Present (07/14/2023)   Received from [facility] - Occupational Stress Questionnaire    Feeling of Stress : Not at all  Social Connections: Socially Integrated (07/14/2023)   Received from [facility]   Social Network    How would you rate your social network (family, work, friends)?: Good participation with social networks  Intimate Partner Violence: Not At Risk (09/17/2023)   Humiliation, Afraid, Rape, and Kick questionnaire    Fear of Current or Ex-Partner: No    Emotionally Abused: No    Physically Abused: No    Sexually Abused: No    Past Surgical History:  Procedure Laterality Date   A/V FISTULAGRAM Left 04/23/2024    Procedure: A/V Fistulagram;  Surgeon: Marea Selinda RAMAN, MD;  Location: [facility];  Service: Cardiovascular;  Laterality: Left;   AV FISTULA PLACEMENT Left 01/09/2024   Procedure: ARTERIOVENOUS (AV) FISTULA CREATION (BRACHIAL CEPHALIC);  Surgeon: Marea Selinda RAMAN, MD;  Location: [facility];  Service: Vascular;  Laterality: Left;   CAPD REMOVAL N/A 08/02/2023  Procedure: CONTINUOUS AMBULATORY PERITONEAL DIALYSIS  (CAPD) CATHETER REMOVAL;  Surgeon: Jordis Laneta FALCON, MD;  Location: [facility];  Service: General;  Laterality: N/A;   CATARACT EXTRACTION, BILATERAL Bilateral 2019   COLONOSCOPY WITH PROPOFOL  N/A 08/19/2015   Procedure: COLONOSCOPY WITH PROPOFOL ;  Surgeon: Rogelia Copping, MD;  Location: [facility];  Service: Endoscopy;  Laterality: N/A;  diabetic - insulin    CYSTOSCOPY W/ RETROGRADES Bilateral 12/22/2019   Procedure: CYSTOSCOPY WITH RETROGRADE PYELOGRAM;  Surgeon: Twylla Glendia BROCKS, MD;  Location: [facility];  Service: Urology;  Laterality: Bilateral;   CYSTOSCOPY W/ URETERAL STENT PLACEMENT Bilateral 07/05/2021   Procedure: CYSTOSCOPY WITH RETROGRADE PYELOGRAM/BILATERAL URETERAL STENT PLACEMENT;  Surgeon: Twylla Glendia BROCKS, MD;  Location: [facility];  Service: Urology;  Laterality: Bilateral;   CYSTOSCOPY W/ URETERAL STENT PLACEMENT Bilateral 12/26/2021   Procedure: CYSTOSCOPY WITH STENT REMOVAL;  Surgeon: Twylla Glendia BROCKS, MD;  Location: [facility];  Service: Urology;  Laterality: Bilateral;   CYSTOSCOPY W/ URETERAL STENT PLACEMENT Right 03/13/2022   Procedure: CYSTOSCOPY WITH RETROGRADE PYELOGRAM/URETERAL STENT PLACEMENT;  Surgeon: Twylla Glendia BROCKS, MD;  Location: [facility];  Service: Urology;  Laterality: Right;   CYSTOSCOPY W/ URETERAL STENT PLACEMENT Right 01/01/2023   Procedure: CYSTOSCOPY WITH STENT EXCHANGE;  Surgeon: Twylla Glendia BROCKS, MD;  Location: [facility];  Service: Urology;  Laterality: Right;   CYSTOSCOPY W/ URETERAL STENT REMOVAL Right 05/08/2022   Procedure: CYSTOSCOPY WITH  STENT REMOVAL;  Surgeon: Twylla Glendia BROCKS, MD;  Location: [facility];  Service: Urology;  Laterality: Right;   CYSTOSCOPY WITH BIOPSY N/A 12/22/2019   Procedure: CYSTOSCOPY WITH bladder BIOPSY;  Surgeon: Twylla Glendia BROCKS, MD;  Location: [facility];  Service: Urology;  Laterality: N/A;   CYSTOSCOPY WITH BIOPSY  03/13/2022   Procedure: CYSTOSCOPY WITH BLADDER BIOPSY;  Surgeon: Twylla Glendia BROCKS, MD;  Location: [facility];  Service: Urology;;   CYSTOSCOPY WITH STENT PLACEMENT Right 12/22/2019   Procedure: CYSTOSCOPY WITH STENT PLACEMENT;  Surgeon: Twylla Glendia BROCKS, MD;  Location: [facility];  Service: Urology;  Laterality: Right;   CYSTOSCOPY WITH STENT PLACEMENT Right 07/03/2022   Procedure: CYSTOSCOPY/RIGHT URETEROSCOPY WITH STENT PLACEMENT;  Surgeon: Twylla Glendia BROCKS, MD;  Location: [facility];  Service: Urology;  Laterality: Right;   CYSTOSCOPY WITH URETEROSCOPY Bilateral 12/22/2019   Procedure: CYSTOSCOPY WITH URETEROSCOPY;  Surgeon: Twylla Glendia BROCKS, MD;  Location: [facility];  Service: Urology;  Laterality: Bilateral;   CYSTOSCOPY/URETEROSCOPY/HOLMIUM LASER/STENT PLACEMENT Right 12/22/2019   Procedure: CYSTOSCOPY/URETEROSCOPY/HOLMIUM LASER/STENT PLACEMENT;  Surgeon: Twylla Glendia BROCKS, MD;  Location: [facility];  Service: Urology;  Laterality: Right;   CYSTOSCOPY/URETEROSCOPY/HOLMIUM LASER/STENT PLACEMENT Bilateral 12/08/2021   Procedure: CYSTOSCOPY/URETEROSCOPY/HOLMIUM LASER/STENT PLACEMENT;  Surgeon: Francisca Redell BROCKS, MD;  Location: [facility];  Service: Urology;  Laterality: Bilateral;   DIALYSIS/PERMA CATHETER INSERTION N/A 06/29/2021   Procedure: DIALYSIS/PERMA CATHETER INSERTION;  Surgeon: Marea Selinda RAMAN, MD;  Location: [facility];  Service: Cardiovascular;  Laterality: N/A;   DIALYSIS/PERMA CATHETER INSERTION N/A 07/13/2021   Procedure: DIALYSIS/PERMA CATHETER INSERTION;  Surgeon: Marea Selinda RAMAN, MD;  Location: [facility];  Service: Cardiovascular;  Laterality: N/A;   DIALYSIS/PERMA  CATHETER INSERTION N/A 08/01/2023   Procedure: DIALYSIS/PERMA CATHETER INSERTION;  Surgeon: Marea Selinda RAMAN, MD;  Location: [facility];  Service: Cardiovascular;  Laterality: N/A;   EYE SURGERY     INCISIONAL HERNIA REPAIR  06/28/2023   IR GASTROSTOMY TUBE MOD SED  08/01/2021   LAPAROTOMY N/A 06/29/2021   Procedure: EXPLORATORY LAPAROTOMY WITH REPAIR OF DUODENAL PERFORATION;  Surgeon: Desiderio Schanz, MD;  Location: [facility];  Service: General;  Laterality: N/A;   POLYPECTOMY  08/19/2015   Procedure: POLYPECTOMY;  Surgeon: Rogelia Copping, MD;  Location: [facility];  Service: Endoscopy;;   TUBAL LIGATION  1992    Family History  Problem Relation Age of Onset   Diabetes Father    Cancer Father        lung cancer   Diabetes Brother    Stroke Mother     Allergies  Allergen Reactions   Contrast Media  [Iodinated Contrast Media] Anaphylaxis   Iodine  Swelling    (IV only) - angioedema       Latest Ref Rng & Units 01/09/2024    6:29 AM 12/03/2023    9:54 AM 12/03/2023    8:58 AM  CBC  WBC 4.0 - 10.5 K/uL  5.9    Hemoglobin 12.0 - 15.0 g/dL 86.0  89.4  88.7   Hematocrit 36.0 - 46.0 % 41.0  33.4  33.0   Platelets 150 - 400 K/uL  232        CMP     Component Value Date/Time   NA 136 01/09/2024 0629   NA 138 10/03/2022 1350   NA 133 (L) 01/29/2014 0406   K 4.0 01/09/2024 0629   K 4.0 01/29/2014 0406   CL 98 01/09/2024 0629   CL 96 (L) 01/29/2014 0406   CO2 22 12/03/2023 0954   CO2 29 01/29/2014 0406   GLUCOSE 332 (H) 01/09/2024 0629   GLUCOSE 377 (H) 01/29/2014 0406   BUN 25 (H) 01/09/2024 0629   BUN 39 (H) 10/03/2022 1350   BUN 31 (H) 01/29/2014 0406   CREATININE 4.00 (H) 01/09/2024 0629   CREATININE 1.17 (H) 03/17/2018 0830   CALCIUM  8.6 (L) 12/03/2023 0954   CALCIUM  9.7 01/29/2014 0406   PROT 7.2 09/17/2023 0530   PROT 6.9 02/10/2022 0939   PROT 8.1 01/27/2014 1318   ALBUMIN  2.6 (L) 09/21/2023 0736   ALBUMIN  3.8 02/10/2022 0939   ALBUMIN  4.1 01/27/2014  1318   AST 36 09/17/2023 0530   AST 36 01/27/2014 1318   ALT 33 09/17/2023 0530   ALT 30 01/27/2014 1318   ALKPHOS 113 09/17/2023 0530   ALKPHOS 110 01/27/2014 1318   BILITOT 0.6 09/17/2023 0530   BILITOT <0.2 02/10/2022 0939   BILITOT 0.5 01/27/2014 1318   EGFR 18 (L) 10/03/2022 1350   GFRNONAA 11 (L) 12/03/2023 0954   GFRNONAA 48 (L) 08/21/2016 0908     No results found.     Assessment & Plan:   1. ESRD on dialysis [facility]) (Primary) Recommend:  The patient is doing well and currently has adequate dialysis access.  Although there are some parameters suggesting possible future issues.  The patient's dialysis center is not reporting any major access issues.  However, there remains an elevated velocities in the area that was repaired.  While these are improved postintervention, this raises concerns that the access is at moderate but not high risk for a problem or thrombosis and should be followed more closely  The patient will follow-up with me in the office in 3 months.  The need for a follow up duplex ultrasound will be made at that time based on whether problems with the access are persistent.    2. Type 2 diabetes mellitus with chronic kidney disease on chronic dialysis, with long-term current use of insulin  (HCC) Continue hypoglycemic medications as already ordered, these medications have been reviewed and there are  no changes at this time.  Hgb A1C to be monitored as already arranged by primary service  3. Essential hypertension Continue antihypertensive medications as already ordered, these medications have been reviewed and there are no changes at this time.   Current Outpatient Medications on File Prior to Visit  Medication Sig Dispense Refill   ascorbic acid  (VITAMIN C) 500 MG tablet Take 500 mg by mouth daily.     aspirin  81 MG EC tablet Take 81 mg by mouth daily.     atorvastatin  (LIPITOR) 20 MG tablet Take 20 mg by mouth at bedtime.     buPROPion  ER (WELLBUTRIN   SR) 100 MG 12 hr tablet Take 100 mg by mouth daily.     busPIRone  (BUSPAR ) 5 MG tablet Take 5 mg by mouth daily.     calcium -vitamin D  (OSCAL WITH D) 500-5 MG-MCG tablet Take 1 tablet by mouth daily in the afternoon.     cloNIDine  (CATAPRES ) 0.1 MG tablet Take 1 tablet (0.1 mg total) by mouth 2 (two) times daily. 60 tablet 11   cyanocobalamin  (VITAMIN B12) 1000 MCG tablet Take 1,000 mcg by mouth daily.     Dulaglutide  3 MG/0.5ML SOAJ Inject 3 mg into the skin.     ferrous sulfate  325 (65 FE) MG tablet Take 325 mg by mouth daily with breakfast.     folic acid  (FOLVITE ) 1 MG tablet Take 1 mg by mouth daily.     insulin  aspart (NOVOLOG ) 100 UNIT/ML injection Inject 3 Units into the skin 3 (three) times daily before meals. (Patient taking differently: Inject 9 Units into the skin in the morning and at bedtime.) 10 mL 11   irbesartan (AVAPRO) 150 MG tablet SMARTSIG:1 Tablet(s) By Mouth Every Evening     loperamide  (IMODIUM  A-D) 2 MG tablet Take 2 mg by mouth every 6 (six) hours as needed for diarrhea or loose stools.     magnesium  30 MG tablet Take 30 mg by mouth daily.     metoprolol  tartrate (LOPRESSOR ) 100 MG tablet Take 1 tablet (100 mg total) by mouth 2 (two) times daily. 60 tablet 2   Multiple Vitamin (MULTIVITAMIN WITH MINERALS) TABS tablet Take 1 tablet by mouth daily. 30 tablet 3   polyethylene glycol (MIRALAX  / GLYCOLAX ) 17 g packet Take 17 g by mouth daily as needed for mild constipation or moderate constipation.     senna-docusate (SENOKOT-S) 8.6-50 MG tablet Take 1 tablet by mouth at bedtime as needed for mild constipation or moderate constipation.     sevelamer carbonate (RENVELA) 800 MG tablet Take 1,600 mg by mouth 3 (three) times daily.     sodium bicarbonate  650 MG tablet Take 650 mg by mouth.     traMADol  (ULTRAM ) 50 MG tablet Take 1 tablet (50 mg total) by mouth every 6 (six) hours as needed. 20 tablet 0   HYDROcodone -acetaminophen  (NORCO/VICODIN) 5-325 MG tablet Take 1 tablet by  mouth every 4 (four) hours as needed. (Patient not taking: Reported on 04/23/2024)     insulin  glargine (LANTUS ) 100 UNIT/ML injection Inject 0.15 mLs (15 Units total) into the skin at bedtime. (Patient taking differently: Inject 20 Units into the skin daily.) 10 mL 11   lactulose  (CHRONULAC ) 10 GM/15ML solution Take 45 mLs (30 g total) by mouth 2 (two) times daily as needed for mild constipation or moderate constipation. (Patient not taking: Reported on 04/21/2024) 236 mL 0   No current facility-administered medications on file prior to visit.    There are no Patient  Instructions on file for this visit. No follow-ups on file.   Anup Brigham E Haden Suder, NP

## 2024-06-15 NOTE — Telephone Encounter (Signed)
 Called pt's husband to inform him that they have an appointment with Dr. Twylla this Wednesday to discuss stent exchange/removal. Pt voiced understanding.

## 2024-06-17 ENCOUNTER — Ambulatory Visit
Admission: RE | Admit: 2024-06-17 | Discharge: 2024-06-17 | Disposition: A | Discharge status: Home / Self Care | Attending: Urology | Admitting: Urology

## 2024-06-17 ENCOUNTER — Ambulatory Visit
Admission: RE | Admit: 2024-06-17 | Discharge: 2024-06-17 | Disposition: A | Discharge status: Home / Self Care | Source: Ambulatory Visit | Attending: Urology | Admitting: Urology

## 2024-06-17 ENCOUNTER — Ambulatory Visit (INDEPENDENT_AMBULATORY_CARE_PROVIDER_SITE_OTHER): Admitting: Urology

## 2024-06-17 ENCOUNTER — Encounter: Payer: Self-pay | Admitting: Urology

## 2024-06-17 VITALS — BP 127/66 | Ht 63.0 in | Wt 217.0 lb

## 2024-06-17 DIAGNOSIS — N1330 Unspecified hydronephrosis: Secondary | ICD-10-CM | POA: Insufficient documentation

## 2024-06-17 NOTE — Progress Notes (Addendum)
 06/17/2024 4:53 PM   Jenkins FORBES [facility] [birth year] [id number]  Referring provider: Franchot Houston, PA-C [address] [place],  [place] [address]  Chief Complaint  Patient presents with   Results    HPI: KIMIKO COMMON is a 78 y.o. female with right hydronephrosis which on previous evaluation was felt to be nonobstructive and due to VUR.  Her right ureteral stent was removed 05/08/2022 and she had a gradual rise in her creatinine and her stent was subsequently replaced on 07/03/2022.  Her last stent exchange was 01/01/2023.  She had been scheduled for stent exchange February 2025 however was canceled secondary to severe hyperglycemia.  She developed ESRD and presently on hemodialysis.  She would like to have her stent removed and not exchanged since she is on dialysis.  She is oliguric but not an uric  PMH: Past Medical History:  Diagnosis Date   Acute renal failure (HCC) 06/27/2021   Anemia in chronic kidney disease    Anxiety    Aortic atherosclerosis (HCC)    Cataract    Depression    Dysrhythmia    ESRD on dialysis (HCC)    GERD (gastroesophageal reflux disease)    Gout    History of kidney stones    Hyperlipidemia    Hypertension, essential    Nausea & vomiting 12/02/2021   Obesity    Obstruction of left ureteropelvic junction (UPJ) due to stone 06/2021   Osteoarthritis    Perforated duodenal ulcer (HCC) 06/27/2021   Pyonephrosis 06/2021   left   Septic shock (HCC) 06/27/2021   Spontaneous bacterial peritonitis (HCC)    Subdural hematoma (HCC) 08/2023   Type 2 diabetes mellitus with stage 5 chronic kidney disease (HCC)    Vertigo     Surgical History: Past Surgical History:  Procedure Laterality Date   A/V FISTULAGRAM Left 04/23/2024   Procedure: A/V Fistulagram;  Surgeon: Marea Selinda RAMAN, MD;  Location: [facility];  Service: Cardiovascular;  Laterality: Left;   AV FISTULA PLACEMENT Left 01/09/2024   Procedure: ARTERIOVENOUS (AV) FISTULA CREATION  (BRACHIAL CEPHALIC);  Surgeon: Marea Selinda RAMAN, MD;  Location: [facility];  Service: Vascular;  Laterality: Left;   CAPD REMOVAL N/A 08/02/2023   Procedure: CONTINUOUS AMBULATORY PERITONEAL DIALYSIS  (CAPD) CATHETER REMOVAL;  Surgeon: Jordis Laneta FALCON, MD;  Location: [facility];  Service: General;  Laterality: N/A;   CATARACT EXTRACTION, BILATERAL Bilateral 2019   COLONOSCOPY WITH PROPOFOL  N/A 08/19/2015   Procedure: COLONOSCOPY WITH PROPOFOL ;  Surgeon: Rogelia Copping, MD;  Location: [facility];  Service: Endoscopy;  Laterality: N/A;  diabetic - insulin    CYSTOSCOPY W/ RETROGRADES Bilateral 12/22/2019   Procedure: CYSTOSCOPY WITH RETROGRADE PYELOGRAM;  Surgeon: Twylla Glendia BROCKS, MD;  Location: [facility];  Service: Urology;  Laterality: Bilateral;   CYSTOSCOPY W/ URETERAL STENT PLACEMENT Bilateral 07/05/2021   Procedure: CYSTOSCOPY WITH RETROGRADE PYELOGRAM/BILATERAL URETERAL STENT PLACEMENT;  Surgeon: Twylla Glendia BROCKS, MD;  Location: [facility];  Service: Urology;  Laterality: Bilateral;   CYSTOSCOPY W/ URETERAL STENT PLACEMENT Bilateral 12/26/2021   Procedure: CYSTOSCOPY WITH STENT REMOVAL;  Surgeon: Twylla Glendia BROCKS, MD;  Location: [facility];  Service: Urology;  Laterality: Bilateral;   CYSTOSCOPY W/ URETERAL STENT PLACEMENT Right 03/13/2022   Procedure: CYSTOSCOPY WITH RETROGRADE PYELOGRAM/URETERAL STENT PLACEMENT;  Surgeon: Twylla Glendia BROCKS, MD;  Location: [facility];  Service: Urology;  Laterality: Right;   CYSTOSCOPY W/ URETERAL STENT PLACEMENT Right 01/01/2023   Procedure: CYSTOSCOPY WITH STENT EXCHANGE;  Surgeon: Twylla,  Glendia BROCKS, MD;  Location: [facility];  Service: Urology;  Laterality: Right;   CYSTOSCOPY W/ URETERAL STENT REMOVAL Right 05/08/2022   Procedure: CYSTOSCOPY WITH STENT REMOVAL;  Surgeon: Twylla Glendia BROCKS, MD;  Location: [facility];  Service: Urology;  Laterality: Right;   CYSTOSCOPY WITH BIOPSY N/A 12/22/2019   Procedure: CYSTOSCOPY WITH bladder BIOPSY;  Surgeon: Twylla Glendia BROCKS, MD;   Location: [facility];  Service: Urology;  Laterality: N/A;   CYSTOSCOPY WITH BIOPSY  03/13/2022   Procedure: CYSTOSCOPY WITH BLADDER BIOPSY;  Surgeon: Twylla Glendia BROCKS, MD;  Location: [facility];  Service: Urology;;   CYSTOSCOPY WITH STENT PLACEMENT Right 12/22/2019   Procedure: CYSTOSCOPY WITH STENT PLACEMENT;  Surgeon: Twylla Glendia BROCKS, MD;  Location: [facility];  Service: Urology;  Laterality: Right;   CYSTOSCOPY WITH STENT PLACEMENT Right 07/03/2022   Procedure: CYSTOSCOPY/RIGHT URETEROSCOPY WITH STENT PLACEMENT;  Surgeon: Twylla Glendia BROCKS, MD;  Location: [facility];  Service: Urology;  Laterality: Right;   CYSTOSCOPY WITH URETEROSCOPY Bilateral 12/22/2019   Procedure: CYSTOSCOPY WITH URETEROSCOPY;  Surgeon: Twylla Glendia BROCKS, MD;  Location: [facility];  Service: Urology;  Laterality: Bilateral;   CYSTOSCOPY/URETEROSCOPY/HOLMIUM LASER/STENT PLACEMENT Right 12/22/2019   Procedure: CYSTOSCOPY/URETEROSCOPY/HOLMIUM LASER/STENT PLACEMENT;  Surgeon: Twylla Glendia BROCKS, MD;  Location: [facility];  Service: Urology;  Laterality: Right;   CYSTOSCOPY/URETEROSCOPY/HOLMIUM LASER/STENT PLACEMENT Bilateral 12/08/2021   Procedure: CYSTOSCOPY/URETEROSCOPY/HOLMIUM LASER/STENT PLACEMENT;  Surgeon: Francisca Redell BROCKS, MD;  Location: [facility];  Service: Urology;  Laterality: Bilateral;   DIALYSIS/PERMA CATHETER INSERTION N/A 06/29/2021   Procedure: DIALYSIS/PERMA CATHETER INSERTION;  Surgeon: Marea Selinda RAMAN, MD;  Location: [facility];  Service: Cardiovascular;  Laterality: N/A;   DIALYSIS/PERMA CATHETER INSERTION N/A 07/13/2021   Procedure: DIALYSIS/PERMA CATHETER INSERTION;  Surgeon: Marea Selinda RAMAN, MD;  Location: [facility];  Service: Cardiovascular;  Laterality: N/A;   DIALYSIS/PERMA CATHETER INSERTION N/A 08/01/2023   Procedure: DIALYSIS/PERMA CATHETER INSERTION;  Surgeon: Marea Selinda RAMAN, MD;  Location: [facility];  Service: Cardiovascular;  Laterality: N/A;   EYE SURGERY     INCISIONAL HERNIA REPAIR   06/28/2023   IR GASTROSTOMY TUBE MOD SED  08/01/2021   LAPAROTOMY N/A 06/29/2021   Procedure: EXPLORATORY LAPAROTOMY WITH REPAIR OF DUODENAL PERFORATION;  Surgeon: Desiderio Schanz, MD;  Location: [facility];  Service: General;  Laterality: N/A;   POLYPECTOMY  08/19/2015   Procedure: POLYPECTOMY;  Surgeon: Rogelia Copping, MD;  Location: [facility];  Service: Endoscopy;;   TUBAL LIGATION  1992    Home Medications:  Allergies as of 06/17/2024       Reactions   Contrast Media  [iodinated Contrast Media] Anaphylaxis   Iodine  Swelling   (IV only) - angioedema        Medication List        Accurate as of June 17, 2024  4:53 PM. If you have any questions, ask your nurse or doctor.          STOP taking these medications    ferrous sulfate  325 (65 FE) MG tablet   HYDROcodone -acetaminophen  5-325 MG tablet Commonly known as: NORCO/VICODIN   lactulose  10 GM/15ML solution Commonly known as: CHRONULAC    polyethylene glycol 17 g packet Commonly known as: MIRALAX  / GLYCOLAX    senna-docusate 8.6-50 MG tablet Commonly known as: Senokot-S   sodium bicarbonate  650 MG tablet   traMADol  50 MG tablet Commonly known as: Ultram        TAKE these medications    ascorbic acid  500 MG tablet Commonly known  as: VITAMIN C Take 500 mg by mouth daily.   aspirin  EC 81 MG tablet Take 81 mg by mouth daily.   atorvastatin  20 MG tablet Commonly known as: LIPITOR Take 20 mg by mouth at bedtime.   buPROPion  ER 100 MG 12 hr tablet Commonly known as: WELLBUTRIN  SR Take 100 mg by mouth daily.   busPIRone  5 MG tablet Commonly known as: BUSPAR  Take 5 mg by mouth daily.   calcium -vitamin D  500-5 MG-MCG tablet Commonly known as: OSCAL WITH D Take 1 tablet by mouth daily in the afternoon.   cloNIDine  0.1 MG tablet Commonly known as: CATAPRES  Take 1 tablet (0.1 mg total) by mouth 2 (two) times daily.   cyanocobalamin  1000 MCG tablet Commonly known as: VITAMIN B12 Take 1,000 mcg by  mouth daily.   Dulaglutide  3 MG/0.5ML Soaj Inject 3 mg into the skin.   folic acid  1 MG tablet Commonly known as: FOLVITE  Take 1 mg by mouth daily.   insulin  aspart 100 UNIT/ML injection Commonly known as: novoLOG  Inject 3 Units into the skin 3 (three) times daily before meals. What changed:  how much to take when to take this   insulin  glargine 100 UNIT/ML injection Commonly known as: LANTUS  Inject 0.15 mLs (15 Units total) into the skin at bedtime. What changed:  how much to take when to take this   irbesartan 150 MG tablet Commonly known as: AVAPRO SMARTSIG:1 Tablet(s) By Mouth Every Evening   loperamide  2 MG tablet Commonly known as: IMODIUM  A-D Take 2 mg by mouth every 6 (six) hours as needed for diarrhea or loose stools.   magnesium  30 MG tablet Take 30 mg by mouth daily.   metoprolol  tartrate 100 MG tablet Commonly known as: LOPRESSOR  Take 1 tablet (100 mg total) by mouth 2 (two) times daily.   multivitamin with minerals Tabs tablet Take 1 tablet by mouth daily.   sevelamer carbonate 800 MG tablet Commonly known as: RENVELA Take 1,600 mg by mouth 3 (three) times daily.        Allergies:  Allergies  Allergen Reactions   Contrast Media  [Iodinated Contrast Media] Anaphylaxis   Iodine  Swelling    (IV only) - angioedema    Family History: Family History  Problem Relation Age of Onset   Diabetes Father    Cancer Father        lung cancer   Diabetes Brother    Stroke Mother     Social History:  reports that she quit smoking about 52 years ago. Her smoking use included cigarettes. She started smoking about 58 years ago. She has a 4.5 pack-year smoking history. She has never used smokeless tobacco. She reports that she does not drink alcohol and does not use drugs.   Physical Exam: BP 127/66   Ht 5' 3 (1.6 m)   Wt 217 lb (98.4 kg)   BMI 38.44 kg/m   Constitutional:  Alert and oriented, No acute distress. HEENT: [place] AT Respiratory: Normal  respiratory effort, no increased work of breathing. Psychiatric: Normal mood and affect.  Pertinent imaging: KUB obtained today was personally reviewed and interpreted.  The right ureteral stent is in good position.  No encrustation is noted.   Assessment & Plan:    1.  Right hydronephrosis Her stent was initially placed for rising creatinine and not flank pain or infection.  She subsequently developed ESRD with improvement after stent replacement Her stent was last exchanged 17 months ago however no significant encrustation noted on KUB  today Will schedule cystoscopy with stent removal We discussed the possible need for stent replacement or percutaneous nephrostomy if she develops pyonephrosis or flank pain   Glendia JAYSON Barba, MD  [facility] [address] [place], [place] [address] [phone]

## 2024-06-17 NOTE — H&P (View-Only) (Signed)
 06/17/2024 4:53 PM   Jenkins FORBES [facility] [birth year] [id number]  Referring provider: Franchot Houston, PA-C [address] [place],  [place] [address]  Chief Complaint  Patient presents with   Results    HPI: Heather Lopez is a 78 y.o. female with right hydronephrosis which on previous evaluation was felt to be nonobstructive and due to VUR.  Her right ureteral stent was removed 05/08/2022 and she had a gradual rise in her creatinine and her stent was subsequently replaced on 07/03/2022.  Her last stent exchange was 01/01/2023.  She had been scheduled for stent exchange February 2025 however was canceled secondary to severe hyperglycemia.  She developed ESRD and presently on hemodialysis.  She would like to have her stent removed and not exchanged since she is on dialysis.  She is oliguric but not an uric  PMH: Past Medical History:  Diagnosis Date   Acute renal failure (HCC) 06/27/2021   Anemia in chronic kidney disease    Anxiety    Aortic atherosclerosis (HCC)    Cataract    Depression    Dysrhythmia    ESRD on dialysis (HCC)    GERD (gastroesophageal reflux disease)    Gout    History of kidney stones    Hyperlipidemia    Hypertension, essential    Nausea & vomiting 12/02/2021   Obesity    Obstruction of left ureteropelvic junction (UPJ) due to stone 06/2021   Osteoarthritis    Perforated duodenal ulcer (HCC) 06/27/2021   Pyonephrosis 06/2021   left   Septic shock (HCC) 06/27/2021   Spontaneous bacterial peritonitis (HCC)    Subdural hematoma (HCC) 08/2023   Type 2 diabetes mellitus with stage 5 chronic kidney disease (HCC)    Vertigo     Surgical History: Past Surgical History:  Procedure Laterality Date   A/V FISTULAGRAM Left 04/23/2024   Procedure: A/V Fistulagram;  Surgeon: Marea Selinda RAMAN, MD;  Location: [facility];  Service: Cardiovascular;  Laterality: Left;   AV FISTULA PLACEMENT Left 01/09/2024   Procedure: ARTERIOVENOUS (AV) FISTULA CREATION  (BRACHIAL CEPHALIC);  Surgeon: Marea Selinda RAMAN, MD;  Location: [facility];  Service: Vascular;  Laterality: Left;   CAPD REMOVAL N/A 08/02/2023   Procedure: CONTINUOUS AMBULATORY PERITONEAL DIALYSIS  (CAPD) CATHETER REMOVAL;  Surgeon: Jordis Laneta FALCON, MD;  Location: [facility];  Service: General;  Laterality: N/A;   CATARACT EXTRACTION, BILATERAL Bilateral 2019   COLONOSCOPY WITH PROPOFOL  N/A 08/19/2015   Procedure: COLONOSCOPY WITH PROPOFOL ;  Surgeon: Rogelia Copping, MD;  Location: [facility];  Service: Endoscopy;  Laterality: N/A;  diabetic - insulin    CYSTOSCOPY W/ RETROGRADES Bilateral 12/22/2019   Procedure: CYSTOSCOPY WITH RETROGRADE PYELOGRAM;  Surgeon: Twylla Glendia BROCKS, MD;  Location: [facility];  Service: Urology;  Laterality: Bilateral;   CYSTOSCOPY W/ URETERAL STENT PLACEMENT Bilateral 07/05/2021   Procedure: CYSTOSCOPY WITH RETROGRADE PYELOGRAM/BILATERAL URETERAL STENT PLACEMENT;  Surgeon: Twylla Glendia BROCKS, MD;  Location: [facility];  Service: Urology;  Laterality: Bilateral;   CYSTOSCOPY W/ URETERAL STENT PLACEMENT Bilateral 12/26/2021   Procedure: CYSTOSCOPY WITH STENT REMOVAL;  Surgeon: Twylla Glendia BROCKS, MD;  Location: [facility];  Service: Urology;  Laterality: Bilateral;   CYSTOSCOPY W/ URETERAL STENT PLACEMENT Right 03/13/2022   Procedure: CYSTOSCOPY WITH RETROGRADE PYELOGRAM/URETERAL STENT PLACEMENT;  Surgeon: Twylla Glendia BROCKS, MD;  Location: [facility];  Service: Urology;  Laterality: Right;   CYSTOSCOPY W/ URETERAL STENT PLACEMENT Right 01/01/2023   Procedure: CYSTOSCOPY WITH STENT EXCHANGE;  Surgeon: Twylla,  Glendia BROCKS, MD;  Location: [facility];  Service: Urology;  Laterality: Right;   CYSTOSCOPY W/ URETERAL STENT REMOVAL Right 05/08/2022   Procedure: CYSTOSCOPY WITH STENT REMOVAL;  Surgeon: Twylla Glendia BROCKS, MD;  Location: [facility];  Service: Urology;  Laterality: Right;   CYSTOSCOPY WITH BIOPSY N/A 12/22/2019   Procedure: CYSTOSCOPY WITH bladder BIOPSY;  Surgeon: Twylla Glendia BROCKS, MD;   Location: [facility];  Service: Urology;  Laterality: N/A;   CYSTOSCOPY WITH BIOPSY  03/13/2022   Procedure: CYSTOSCOPY WITH BLADDER BIOPSY;  Surgeon: Twylla Glendia BROCKS, MD;  Location: [facility];  Service: Urology;;   CYSTOSCOPY WITH STENT PLACEMENT Right 12/22/2019   Procedure: CYSTOSCOPY WITH STENT PLACEMENT;  Surgeon: Twylla Glendia BROCKS, MD;  Location: [facility];  Service: Urology;  Laterality: Right;   CYSTOSCOPY WITH STENT PLACEMENT Right 07/03/2022   Procedure: CYSTOSCOPY/RIGHT URETEROSCOPY WITH STENT PLACEMENT;  Surgeon: Twylla Glendia BROCKS, MD;  Location: [facility];  Service: Urology;  Laterality: Right;   CYSTOSCOPY WITH URETEROSCOPY Bilateral 12/22/2019   Procedure: CYSTOSCOPY WITH URETEROSCOPY;  Surgeon: Twylla Glendia BROCKS, MD;  Location: [facility];  Service: Urology;  Laterality: Bilateral;   CYSTOSCOPY/URETEROSCOPY/HOLMIUM LASER/STENT PLACEMENT Right 12/22/2019   Procedure: CYSTOSCOPY/URETEROSCOPY/HOLMIUM LASER/STENT PLACEMENT;  Surgeon: Twylla Glendia BROCKS, MD;  Location: [facility];  Service: Urology;  Laterality: Right;   CYSTOSCOPY/URETEROSCOPY/HOLMIUM LASER/STENT PLACEMENT Bilateral 12/08/2021   Procedure: CYSTOSCOPY/URETEROSCOPY/HOLMIUM LASER/STENT PLACEMENT;  Surgeon: Francisca Redell BROCKS, MD;  Location: [facility];  Service: Urology;  Laterality: Bilateral;   DIALYSIS/PERMA CATHETER INSERTION N/A 06/29/2021   Procedure: DIALYSIS/PERMA CATHETER INSERTION;  Surgeon: Marea Selinda RAMAN, MD;  Location: [facility];  Service: Cardiovascular;  Laterality: N/A;   DIALYSIS/PERMA CATHETER INSERTION N/A 07/13/2021   Procedure: DIALYSIS/PERMA CATHETER INSERTION;  Surgeon: Marea Selinda RAMAN, MD;  Location: [facility];  Service: Cardiovascular;  Laterality: N/A;   DIALYSIS/PERMA CATHETER INSERTION N/A 08/01/2023   Procedure: DIALYSIS/PERMA CATHETER INSERTION;  Surgeon: Marea Selinda RAMAN, MD;  Location: [facility];  Service: Cardiovascular;  Laterality: N/A;   EYE SURGERY     INCISIONAL HERNIA REPAIR   06/28/2023   IR GASTROSTOMY TUBE MOD SED  08/01/2021   LAPAROTOMY N/A 06/29/2021   Procedure: EXPLORATORY LAPAROTOMY WITH REPAIR OF DUODENAL PERFORATION;  Surgeon: Desiderio Schanz, MD;  Location: [facility];  Service: General;  Laterality: N/A;   POLYPECTOMY  08/19/2015   Procedure: POLYPECTOMY;  Surgeon: Rogelia Copping, MD;  Location: [facility];  Service: Endoscopy;;   TUBAL LIGATION  1992    Home Medications:  Allergies as of 06/17/2024       Reactions   Contrast Media  [iodinated Contrast Media] Anaphylaxis   Iodine  Swelling   (IV only) - angioedema        Medication List        Accurate as of June 17, 2024  4:53 PM. If you have any questions, ask your nurse or doctor.          STOP taking these medications    ferrous sulfate  325 (65 FE) MG tablet   HYDROcodone -acetaminophen  5-325 MG tablet Commonly known as: NORCO/VICODIN   lactulose  10 GM/15ML solution Commonly known as: CHRONULAC    polyethylene glycol 17 g packet Commonly known as: MIRALAX  / GLYCOLAX    senna-docusate 8.6-50 MG tablet Commonly known as: Senokot-S   sodium bicarbonate  650 MG tablet   traMADol  50 MG tablet Commonly known as: Ultram        TAKE these medications    ascorbic acid  500 MG tablet Commonly known  as: VITAMIN C Take 500 mg by mouth daily.   aspirin  EC 81 MG tablet Take 81 mg by mouth daily.   atorvastatin  20 MG tablet Commonly known as: LIPITOR Take 20 mg by mouth at bedtime.   buPROPion  ER 100 MG 12 hr tablet Commonly known as: WELLBUTRIN  SR Take 100 mg by mouth daily.   busPIRone  5 MG tablet Commonly known as: BUSPAR  Take 5 mg by mouth daily.   calcium -vitamin D  500-5 MG-MCG tablet Commonly known as: OSCAL WITH D Take 1 tablet by mouth daily in the afternoon.   cloNIDine  0.1 MG tablet Commonly known as: CATAPRES  Take 1 tablet (0.1 mg total) by mouth 2 (two) times daily.   cyanocobalamin  1000 MCG tablet Commonly known as: VITAMIN B12 Take 1,000 mcg by  mouth daily.   Dulaglutide  3 MG/0.5ML Soaj Inject 3 mg into the skin.   folic acid  1 MG tablet Commonly known as: FOLVITE  Take 1 mg by mouth daily.   insulin  aspart 100 UNIT/ML injection Commonly known as: novoLOG  Inject 3 Units into the skin 3 (three) times daily before meals. What changed:  how much to take when to take this   insulin  glargine 100 UNIT/ML injection Commonly known as: LANTUS  Inject 0.15 mLs (15 Units total) into the skin at bedtime. What changed:  how much to take when to take this   irbesartan 150 MG tablet Commonly known as: AVAPRO SMARTSIG:1 Tablet(s) By Mouth Every Evening   loperamide  2 MG tablet Commonly known as: IMODIUM  A-D Take 2 mg by mouth every 6 (six) hours as needed for diarrhea or loose stools.   magnesium  30 MG tablet Take 30 mg by mouth daily.   metoprolol  tartrate 100 MG tablet Commonly known as: LOPRESSOR  Take 1 tablet (100 mg total) by mouth 2 (two) times daily.   multivitamin with minerals Tabs tablet Take 1 tablet by mouth daily.   sevelamer carbonate 800 MG tablet Commonly known as: RENVELA Take 1,600 mg by mouth 3 (three) times daily.        Allergies:  Allergies  Allergen Reactions   Contrast Media  [Iodinated Contrast Media] Anaphylaxis   Iodine  Swelling    (IV only) - angioedema    Family History: Family History  Problem Relation Age of Onset   Diabetes Father    Cancer Father        lung cancer   Diabetes Brother    Stroke Mother     Social History:  reports that she quit smoking about 52 years ago. Her smoking use included cigarettes. She started smoking about 58 years ago. She has a 4.5 pack-year smoking history. She has never used smokeless tobacco. She reports that she does not drink alcohol and does not use drugs.   Physical Exam: BP 127/66   Ht 5' 3 (1.6 m)   Wt 217 lb (98.4 kg)   BMI 38.44 kg/m   Constitutional:  Alert and oriented, No acute distress. HEENT: [place] AT Respiratory: Normal  respiratory effort, no increased work of breathing. Psychiatric: Normal mood and affect.  Pertinent imaging: KUB obtained today was personally reviewed and interpreted.  The right ureteral stent is in good position.  No encrustation is noted.   Assessment & Plan:    1.  Right hydronephrosis Her stent was initially placed for rising creatinine and not flank pain or infection.  She subsequently developed ESRD with improvement after stent replacement Her stent was last exchanged 17 months ago however no significant encrustation noted on KUB  today Will schedule cystoscopy with stent removal We discussed the possible need for stent replacement or percutaneous nephrostomy if she develops pyonephrosis or flank pain   Glendia JAYSON Barba, MD  [facility] [address] [place], [place] [address] [phone]

## 2024-06-18 ENCOUNTER — Ambulatory Visit: Payer: Self-pay | Admitting: Urology

## 2024-06-18 ENCOUNTER — Encounter
Admission: RE | Disposition: A | Discharge status: Home / Self Care | Payer: Self-pay | Source: Home / Self Care | Attending: Vascular Surgery

## 2024-06-18 ENCOUNTER — Ambulatory Visit
Admission: RE | Admit: 2024-06-18 | Discharge: 2024-06-18 | Disposition: A | Discharge status: Home / Self Care | Attending: Vascular Surgery | Admitting: Vascular Surgery

## 2024-06-18 DIAGNOSIS — Z79899 Other long term (current) drug therapy: Secondary | ICD-10-CM | POA: Diagnosis not present

## 2024-06-18 DIAGNOSIS — Z992 Dependence on renal dialysis: Secondary | ICD-10-CM

## 2024-06-18 DIAGNOSIS — E1122 Type 2 diabetes mellitus with diabetic chronic kidney disease: Secondary | ICD-10-CM | POA: Diagnosis not present

## 2024-06-18 DIAGNOSIS — Z452 Encounter for adjustment and management of vascular access device: Secondary | ICD-10-CM | POA: Diagnosis not present

## 2024-06-18 DIAGNOSIS — Z7985 Long-term (current) use of injectable non-insulin antidiabetic drugs: Secondary | ICD-10-CM | POA: Insufficient documentation

## 2024-06-18 DIAGNOSIS — I120 Hypertensive chronic kidney disease with stage 5 chronic kidney disease or end stage renal disease: Secondary | ICD-10-CM | POA: Insufficient documentation

## 2024-06-18 DIAGNOSIS — Z87891 Personal history of nicotine dependence: Secondary | ICD-10-CM | POA: Diagnosis not present

## 2024-06-18 DIAGNOSIS — Z4901 Encounter for fitting and adjustment of extracorporeal dialysis catheter: Secondary | ICD-10-CM | POA: Insufficient documentation

## 2024-06-18 DIAGNOSIS — Z794 Long term (current) use of insulin: Secondary | ICD-10-CM | POA: Diagnosis not present

## 2024-06-18 DIAGNOSIS — N186 End stage renal disease: Secondary | ICD-10-CM | POA: Insufficient documentation

## 2024-06-18 HISTORY — PX: DIALYSIS/PERMA CATHETER REMOVAL: CATH118289

## 2024-06-18 SURGERY — DIALYSIS/PERMA CATHETER REMOVAL
Anesthesia: LOCAL

## 2024-06-18 MED ORDER — LIDOCAINE-EPINEPHRINE (PF) 1 %-1:200000 IJ SOLN
INTRAMUSCULAR | Status: DC | PRN
  Administered 2024-06-18: 10 mL

## 2024-06-18 SURGICAL SUPPLY — 2 items
CHLORAPREP W/TINT 26 (MISCELLANEOUS) IMPLANT
TRAY LACERATION DISP STRL (SET/KITS/TRAYS/PACK) IMPLANT

## 2024-06-18 NOTE — Op Note (Signed)
 Operative Note     Preoperative diagnosis:   1. ESRD with functional permanent access  Postoperative diagnosis:  1. ESRD with functional permanent access  Procedure:  Removal of right jugular Permcath  Surgeon:  Selinda Gu, MD  Anesthesia:  Local  EBL:  Minimal  Indication for the Procedure:  The patient has a functional permanent dialysis access and no longer needs their permcath.  This can be removed.  Risks and benefits are discussed and informed consent is obtained.  Description of the Procedure:  The patient's right neck, chest and existing catheter were sterilely prepped and draped. The area around the catheter was anesthetized copiously with 1% lidocaine . The catheter was dissected out with curved hemostats until the cuff was freed from the surrounding fibrous sheath. The fiber sheath was transected, and the catheter was then removed in its entirety using gentle traction. Pressure was held and sterile dressings were placed. The patient tolerated the procedure well and was taken to the recovery room in stable condition.     Selinda Gu  06/18/2024, 3:21 PM This note was created with Dragon Medical transcription system. Any errors in dictation are purely unintentional.

## 2024-06-18 NOTE — Interval H&P Note (Signed)
 History and Physical Interval Note:  06/18/2024 2:09 PM  Heather Lopez  has presented today for surgery, with the diagnosis of Perma Cath Removal   End Stage Renal.  The various methods of treatment have been discussed with the patient and family. After consideration of risks, benefits and other options for treatment, the patient has consented to  Procedure(s): DIALYSIS/PERMA CATHETER REMOVAL (N/A) as a surgical intervention.  The patient's history has been reviewed, patient examined, no change in status, stable for surgery.  I have reviewed the patient's chart and labs.  Questions were answered to the patient's satisfaction.     Lily Kernen

## 2024-06-18 NOTE — Discharge Instructions (Signed)
 Tunneled Catheter Removal, Care After Refer to this sheet in the next few weeks. These instructions provide you with information about caring for yourself after your procedure. Your health care provider may also give you more specific instructions. Your treatment has been planned according to current medical practices, but problems sometimes occur. Call your health care provider if you have any problems or questions after your procedure. What can I expect after the procedure? After the procedure, it is common to have: Some mild redness, swelling, and pain around your catheter site.   Follow these instructions at home: Incision care  Check your removal site  every day for signs of infection. Check for: More redness, swelling, or pain. More fluid or blood. Warmth. Pus or a bad smell. Remove your dressing in 48hrs leave open to air  Activity  Return to your normal activities as told by your health care provider. Ask your health care provider what activities are safe for you. Do not lift anything that is heavier than 10 lb (4.5 kg) for 3 days  You may shower tomorrow  Contact a health care provider if: You have more fluid or blood coming from your removal site You have more redness, swelling, or pain at your incisions or around the area where your catheter was removed Your removal site feel warm to the touch. You feel unusually weak. You feel nauseous.. Get help right away if You have swelling in your arm, shoulder, neck, or face. You develop chest pain. You have difficulty breathing. You feel dizzy or light-headed. You have pus or a bad smell coming from your removal site You have a fever. You develop bleeding from your removal site, and your bleeding does not stop. This information is not intended to replace advice given to you by your health care provider. Make sure you discuss any questions you have with your health care provider. Document Released: 10/29/2012 Document Revised:  07/15/2016 Document Reviewed: 08/08/2015 Elsevier Interactive Patient Education  2017 [employer].

## 2024-06-19 ENCOUNTER — Encounter: Payer: Self-pay | Admitting: Vascular Surgery

## 2024-06-22 ENCOUNTER — Other Ambulatory Visit: Payer: Self-pay

## 2024-06-22 ENCOUNTER — Telehealth: Payer: Self-pay

## 2024-06-22 DIAGNOSIS — N1330 Unspecified hydronephrosis: Secondary | ICD-10-CM

## 2024-06-22 NOTE — Progress Notes (Signed)
   [facility] Surgical Posting Form  Surgery Date: Date: 07/16/2024  Surgeon: Dr. Glendia Barba, MD  Inpt ( No  )   Outpt (Yes)   Obs ( No  )   Diagnosis: N13.30 Right Hydronephrosis  -CPT: 52310, [phone]  Surgery: Cystoscopy with Right Stent Removal and Right Retrograde Pyelogram  Stop Anticoagulations: No  Cardiac/Medical/Pulmonary Clearance needed: no  *Orders entered into EPIC  Date: 06/22/24   *Case booked in EPIC  Date: 06/22/24  *Notified pt of Surgery: Date: 06/22/24  PRE-OP  UA & CX: yes, will obtain in clinic on 07/02/2024  *Placed into Prior Authorization Work [place] Date: 06/22/24  Assistant/laser/rep:No

## 2024-06-22 NOTE — Telephone Encounter (Signed)
 Per Dr. Twylla, Patient is to be scheduled for Cystoscopy with Right Stent Removal and Right Retrograde Pyelogram  Mrs. Holzmann was contacted and possible surgical dates were discussed, Thursday August 21st, 2025 was agreed upon for surgery.   Patient was instructed that Dr. Twylla will require them to provide a pre-op  UA & CX prior to surgery. This was ordered and scheduled drop off appointment was made for 07/02/2024.    Patient was directed to call [phone] between 1-3pm the day before surgery to find out surgical arrival time.  Instructions were given not to eat or drink from midnight on the night before surgery and have a driver for the day of surgery. On the surgery day patient was instructed to enter through the Medical Mall entrance of [facility] report the Same Day Surgery desk.   Pre-Admit Testing will be in contact via phone to set up an interview with the anesthesia team to review your history and medications prior to surgery.   Reminder of this information was sent via MyChart & Mailed to the patient.

## 2024-06-22 NOTE — Progress Notes (Signed)
 Surgical Physician Order Form [facility]  Dr. Glendia Barba, MD  * Scheduling expectation : Next Available  *Length of Case: 30 min  *Clearance needed: no  *Anticoagulation Instructions: N/A  *Aspirin  Instructions: N/A  *Post-op visit Date/Instructions:  3 month follow up  *Diagnosis: Right Hydronephrosis  *Procedure: Removal right ureteral stent/right retrograde pyelogram   Additional orders: N/A  -Admit type: OUTpatient  -Anesthesia: MAC  -VTE Prophylaxis Standing Order SCD's       Other:   -Standing Lab Orders Per Anesthesia    Lab other: UA&Urine Culture  -Standing Test orders EKG/Chest x-ray per Anesthesia       Test other:   - Medications:  Ancef  2gm IV  -Other orders:  N/A

## 2024-06-22 NOTE — Addendum Note (Signed)
 Addended by: TWYLLA GLENDIA BROCKS on: 06/22/2024 09:52 AM   Modules accepted: Orders

## 2024-06-30 ENCOUNTER — Other Ambulatory Visit: Payer: Self-pay

## 2024-06-30 ENCOUNTER — Encounter (INDEPENDENT_AMBULATORY_CARE_PROVIDER_SITE_OTHER)

## 2024-06-30 ENCOUNTER — Ambulatory Visit (INDEPENDENT_AMBULATORY_CARE_PROVIDER_SITE_OTHER): Admitting: Vascular Surgery

## 2024-07-01 MED ORDER — TROSPIUM CHLORIDE 20 MG PO TABS: 20.0000 mg | ORAL_TABLET | Freq: Two times a day (BID) | ORAL | 3 refills | Status: DC

## 2024-07-02 ENCOUNTER — Other Ambulatory Visit

## 2024-07-02 DIAGNOSIS — N1330 Unspecified hydronephrosis: Secondary | ICD-10-CM

## 2024-07-02 LAB — MICROSCOPIC EXAMINATION
RBC, Urine: >30 /HPF — AB (ref 0–2)
WBC, UA: >30 /HPF — AB (ref 0–5)

## 2024-07-02 LAB — URINALYSIS, COMPLETE
Bilirubin, UA: NEGATIVE
Glucose, UA: NEGATIVE
Ketones, UA: NEGATIVE
Nitrite, UA: NEGATIVE
Specific Gravity, UA: 1.02 (ref 1.005–1.030)
Urobilinogen, Ur: 0.2 mg/dL (ref 0.2–1.0)
pH, UA: 7.5 (ref 5.0–7.5)

## 2024-07-07 LAB — CULTURE, URINE COMPREHENSIVE

## 2024-07-09 ENCOUNTER — Other Ambulatory Visit: Payer: Self-pay

## 2024-07-09 ENCOUNTER — Encounter
Admission: RE | Admit: 2024-07-09 | Discharge: 2024-07-09 | Disposition: A | Discharge status: Home / Self Care | Source: Ambulatory Visit | Attending: Urology | Admitting: Urology

## 2024-07-09 DIAGNOSIS — I4892 Unspecified atrial flutter: Secondary | ICD-10-CM

## 2024-07-09 DIAGNOSIS — N1330 Unspecified hydronephrosis: Secondary | ICD-10-CM

## 2024-07-09 DIAGNOSIS — I10 Essential (primary) hypertension: Secondary | ICD-10-CM

## 2024-07-09 DIAGNOSIS — D631 Anemia in chronic kidney disease: Secondary | ICD-10-CM

## 2024-07-09 DIAGNOSIS — E11319 Type 2 diabetes mellitus with unspecified diabetic retinopathy without macular edema: Secondary | ICD-10-CM

## 2024-07-09 DIAGNOSIS — Z01818 Encounter for other preprocedural examination: Secondary | ICD-10-CM

## 2024-07-09 HISTORY — DX: Unspecified hydronephrosis: N13.30

## 2024-07-09 HISTORY — DX: Unspecified atrial fibrillation: I48.91

## 2024-07-09 NOTE — Patient Instructions (Addendum)
 Your procedure is scheduled on:07-16-24 Thursday Report to the Registration Desk on the 1st floor of the Medical Mall.Then proceed to the 2nd floor Surgery Desk To find out your arrival time, please call [phone] between 1PM - 3PM on:07-15-24 Wednesday If your arrival time is 6:00 am, do not arrive before that time as the Medical Mall entrance doors do not open until 6:00 am.  REMEMBER: Instructions that are not followed completely may result in serious medical risk, up to and including death; or upon the discretion of your surgeon and anesthesiologist your surgery may need to be rescheduled.  Do not eat food OR drink liquids after midnight the night before surgery.  No gum chewing or hard candies.  One week prior to surgery:Stop NOW (07-09-24) Stop Anti-inflammatories (NSAIDS) such as Advil, Aleve, Ibuprofen, Motrin, Naproxen, Naprosyn and Aspirin  based products such as Excedrin, Goody's Powder, BC Powder. Stop ANY OVER THE COUNTER supplements until after surgery (Multivitamin)  You may however, continue to take Tylenol  if needed for pain up until the day of surgery.  Stop your Trulicity  (Dulaglutide ) 7 days prior to surgery-Do NOT take again until AFTER surgery  Continue taking all of your other prescription medications up until the day of surgery.  ON THE DAY OF SURGERY ONLY TAKE THESE MEDICATIONS WITH SIPS OF WATER : -buPROPion  (WELLBUTRIN  XL)  -busPIRone  (BUSPAR )  -cloNIDine  (CATAPRES )  -metoprolol  tartrate (LOPRESSOR )  -trospium  (SANCTURA )   Continue your 81 mg Aspirin  up until the day prior to surgery-Do NOT take the day of surgery  Take half of your Lantus  Insulin  (15 units) the night prior to surgery-NO Insulin  the morning of surgery  No Alcohol for 24 hours before or after surgery.  No Smoking including e-cigarettes for 24 hours before surgery.  No chewable tobacco products for at least 6 hours before surgery.  No nicotine patches on the day of surgery.  Do not use  any recreational drugs for at least a week (preferably 2 weeks) before your surgery.  Please be advised that the combination of cocaine and anesthesia may have negative outcomes, up to and including death. If you test positive for cocaine, your surgery will be cancelled.  On the morning of surgery brush your teeth with toothpaste and water , you may rinse your mouth with mouthwash if you wish. Do not swallow any toothpaste or mouthwash.  Do not wear jewelry, make-up, hairpins, clips or nail polish.  For welded (permanent) jewelry: bracelets, anklets, waist bands, etc.  Please have this removed prior to surgery.  If it is not removed, there is a chance that hospital personnel will need to cut it off on the day of surgery.  Do not wear lotions, powders, or perfumes.   Do not shave body hair from the neck down 48 hours before surgery.  Contact lenses, hearing aids and dentures may not be worn into surgery.  Do not bring valuables to the hospital. [facility] is not responsible for any missing/lost belongings or valuables.   Notify your doctor if there is any change in your medical condition (cold, fever, infection).  Wear comfortable clothing (specific to your surgery type) to the hospital.  After surgery, you can help prevent lung complications by doing breathing exercises.  Take deep breaths and cough every 1-2 hours. Your doctor may order a device called an Incentive Spirometer to help you take deep breaths. When coughing or sneezing, hold a pillow firmly against your incision with both hands. This is called "splinting." Doing this helps protect your  incision. It also decreases belly discomfort.  If you are being admitted to the hospital overnight, leave your suitcase in the car. After surgery it may be brought to your room.  In case of increased patient census, it may be necessary for you, the patient, to continue your postoperative care in the Same Day Surgery department.  If you  are being discharged the day of surgery, you will not be allowed to drive home. You will need a responsible individual to drive you home and stay with you for 24 hours after surgery.   If you are taking public transportation, you will need to have a responsible individual with you.  Please call the Pre-admissions Testing Dept. at [phone] if you have any questions about these instructions.  Surgery Visitation Policy:  Patients having surgery or a procedure may have two visitors.  Children under the age of 55 must have an adult with them who is not the patient.   Merchandiser, retail to address health-related social needs:  [email or web address]

## 2024-07-10 ENCOUNTER — Encounter
Admission: RE | Admit: 2024-07-10 | Discharge: 2024-07-10 | Disposition: A | Discharge status: Home / Self Care | Source: Ambulatory Visit | Attending: Urology | Admitting: Urology

## 2024-07-10 DIAGNOSIS — N186 End stage renal disease: Secondary | ICD-10-CM | POA: Diagnosis not present

## 2024-07-10 DIAGNOSIS — Z794 Long term (current) use of insulin: Secondary | ICD-10-CM | POA: Diagnosis not present

## 2024-07-10 DIAGNOSIS — E1122 Type 2 diabetes mellitus with diabetic chronic kidney disease: Secondary | ICD-10-CM | POA: Diagnosis not present

## 2024-07-10 DIAGNOSIS — Z992 Dependence on renal dialysis: Secondary | ICD-10-CM | POA: Insufficient documentation

## 2024-07-10 DIAGNOSIS — E11319 Type 2 diabetes mellitus with unspecified diabetic retinopathy without macular edema: Secondary | ICD-10-CM | POA: Diagnosis not present

## 2024-07-10 DIAGNOSIS — Z01812 Encounter for preprocedural laboratory examination: Secondary | ICD-10-CM | POA: Diagnosis present

## 2024-07-10 DIAGNOSIS — D631 Anemia in chronic kidney disease: Secondary | ICD-10-CM | POA: Diagnosis not present

## 2024-07-10 DIAGNOSIS — I10 Essential (primary) hypertension: Secondary | ICD-10-CM

## 2024-07-10 DIAGNOSIS — Z01818 Encounter for other preprocedural examination: Secondary | ICD-10-CM | POA: Diagnosis not present

## 2024-07-10 DIAGNOSIS — I120 Hypertensive chronic kidney disease with stage 5 chronic kidney disease or end stage renal disease: Secondary | ICD-10-CM | POA: Insufficient documentation

## 2024-07-10 DIAGNOSIS — Z01810 Encounter for preprocedural cardiovascular examination: Secondary | ICD-10-CM | POA: Diagnosis present

## 2024-07-10 LAB — CBC
HCT: 34.7 % — ABNORMAL LOW (ref 36.0–46.0)
Hemoglobin: 11.8 g/dL — ABNORMAL LOW (ref 12.0–15.0)
MCH: 34.1 pg — ABNORMAL HIGH (ref 26.0–34.0)
MCHC: 34 g/dL (ref 30.0–36.0)
MCV: 100.3 fL — ABNORMAL HIGH (ref 80.0–100.0)
Platelets: 221 K/uL (ref 150–400)
RBC: 3.46 MIL/uL — ABNORMAL LOW (ref 3.87–5.11)
RDW: 15.7 % — ABNORMAL HIGH (ref 11.5–15.5)
WBC: 6.1 K/uL (ref 4.0–10.5)
nRBC: 0 % (ref 0.0–0.2)

## 2024-07-16 ENCOUNTER — Encounter
Admission: RE | Disposition: A | Discharge status: Home / Self Care | Payer: Self-pay | Source: Home / Self Care | Attending: Urology

## 2024-07-16 ENCOUNTER — Encounter: Payer: Self-pay | Admitting: Urology

## 2024-07-16 ENCOUNTER — Ambulatory Visit

## 2024-07-16 ENCOUNTER — Ambulatory Visit: Payer: Self-pay

## 2024-07-16 ENCOUNTER — Ambulatory Visit: Payer: Self-pay | Admitting: Urgent Care

## 2024-07-16 ENCOUNTER — Ambulatory Visit
Admission: RE | Admit: 2024-07-16 | Discharge: 2024-07-16 | Disposition: A | Discharge status: Home / Self Care | Attending: Urology | Admitting: Urology

## 2024-07-16 ENCOUNTER — Other Ambulatory Visit: Payer: Self-pay

## 2024-07-16 DIAGNOSIS — I120 Hypertensive chronic kidney disease with stage 5 chronic kidney disease or end stage renal disease: Secondary | ICD-10-CM | POA: Diagnosis not present

## 2024-07-16 DIAGNOSIS — D631 Anemia in chronic kidney disease: Secondary | ICD-10-CM | POA: Diagnosis not present

## 2024-07-16 DIAGNOSIS — Z794 Long term (current) use of insulin: Secondary | ICD-10-CM | POA: Insufficient documentation

## 2024-07-16 DIAGNOSIS — Z01818 Encounter for other preprocedural examination: Secondary | ICD-10-CM

## 2024-07-16 DIAGNOSIS — N186 End stage renal disease: Secondary | ICD-10-CM | POA: Insufficient documentation

## 2024-07-16 DIAGNOSIS — Z7985 Long-term (current) use of injectable non-insulin antidiabetic drugs: Secondary | ICD-10-CM | POA: Insufficient documentation

## 2024-07-16 DIAGNOSIS — E1122 Type 2 diabetes mellitus with diabetic chronic kidney disease: Secondary | ICD-10-CM | POA: Diagnosis not present

## 2024-07-16 DIAGNOSIS — N1330 Unspecified hydronephrosis: Secondary | ICD-10-CM | POA: Diagnosis present

## 2024-07-16 DIAGNOSIS — Z992 Dependence on renal dialysis: Secondary | ICD-10-CM | POA: Insufficient documentation

## 2024-07-16 HISTORY — PX: CYSTOSCOPY W/ URETERAL STENT REMOVAL: SHX1430

## 2024-07-16 LAB — GLUCOSE, CAPILLARY
Glucose-Capillary: 162 mg/dL — ABNORMAL HIGH (ref 70–99)
Glucose-Capillary: 195 mg/dL — ABNORMAL HIGH (ref 70–99)

## 2024-07-16 LAB — POCT I-STAT, CHEM 8
BUN: 32 mg/dL — ABNORMAL HIGH (ref 8–23)
Calcium, Ion: 1.1 mmol/L — ABNORMAL LOW (ref 1.15–1.40)
Chloride: 94 mmol/L — ABNORMAL LOW (ref 98–111)
Creatinine, Ser: 5 mg/dL — ABNORMAL HIGH (ref 0.44–1.00)
Glucose, Bld: 177 mg/dL — ABNORMAL HIGH (ref 70–99)
HCT: 36 % (ref 36.0–46.0)
Hemoglobin: 12.2 g/dL (ref 12.0–15.0)
Potassium: 4 mmol/L (ref 3.5–5.1)
Sodium: 133 mmol/L — ABNORMAL LOW (ref 135–145)
TCO2: 26 mmol/L (ref 22–32)

## 2024-07-16 SURGERY — REMOVAL, STENT, URETER, CYSTOSCOPIC
Anesthesia: General | Site: Ureter | Laterality: Right

## 2024-07-16 MED ORDER — ONDANSETRON HCL 4 MG/2ML IJ SOLN
INTRAMUSCULAR | Status: AC
  Filled 2024-07-16: qty 2

## 2024-07-16 MED ORDER — STERILE WATER FOR IRRIGATION IR SOLN
Status: DC | PRN
Start: 2024-07-16 — End: 2024-07-16
  Administered 2024-07-16: 3000 mL

## 2024-07-16 MED ORDER — CEFAZOLIN SODIUM-DEXTROSE 2-4 GM/100ML-% IV SOLN
INTRAVENOUS | Status: AC
  Filled 2024-07-16: qty 100

## 2024-07-16 MED ORDER — STERILE WATER FOR IRRIGATION IR SOLN
Status: DC | PRN
  Administered 2024-07-16: 500 mL

## 2024-07-16 MED ORDER — SODIUM CHLORIDE 0.9 % IV SOLN: INTRAVENOUS | Status: DC

## 2024-07-16 MED ORDER — OXYCODONE HCL 5 MG PO TABS: 5.0000 mg | ORAL_TABLET | Freq: Once | ORAL | Status: DC | PRN

## 2024-07-16 MED ORDER — DEXAMETHASONE SODIUM PHOSPHATE 10 MG/ML IJ SOLN
INTRAMUSCULAR | Status: AC
  Filled 2024-07-16: qty 1

## 2024-07-16 MED ORDER — FENTANYL CITRATE (PF) 100 MCG/2ML IJ SOLN: 25.0000 ug | INTRAMUSCULAR | Status: DC | PRN

## 2024-07-16 MED ORDER — LIDOCAINE HCL (PF) 2 % IJ SOLN
INTRAMUSCULAR | Status: AC
  Filled 2024-07-16: qty 5

## 2024-07-16 MED ORDER — ROCURONIUM BROMIDE 10 MG/ML (PF) SYRINGE
PREFILLED_SYRINGE | INTRAVENOUS | Status: AC
  Filled 2024-07-16: qty 10

## 2024-07-16 MED ORDER — PROPOFOL 500 MG/50ML IV EMUL
INTRAVENOUS | Status: DC | PRN
Start: 2024-07-16 — End: 2024-07-16
  Administered 2024-07-16: 60 ug/kg/min via INTRAVENOUS

## 2024-07-16 MED ORDER — CEFAZOLIN SODIUM-DEXTROSE 2-4 GM/100ML-% IV SOLN
2.0000 g | INTRAVENOUS | Status: AC
  Administered 2024-07-16: 1 g via INTRAVENOUS

## 2024-07-16 MED ORDER — ORAL CARE MOUTH RINSE: 15.0000 mL | Freq: Once | OROMUCOSAL | Status: AC

## 2024-07-16 MED ORDER — OXYCODONE HCL 5 MG/5ML PO SOLN: 5.0000 mg | Freq: Once | ORAL | Status: DC | PRN

## 2024-07-16 MED ORDER — PROPOFOL 10 MG/ML IV BOLUS
INTRAVENOUS | Status: DC | PRN
  Administered 2024-07-16: 30 mg via INTRAVENOUS

## 2024-07-16 MED ORDER — CHLORHEXIDINE GLUCONATE 0.12 % MT SOLN
15.0000 mL | Freq: Once | OROMUCOSAL | Status: AC
  Administered 2024-07-16: 15 mL via OROMUCOSAL

## 2024-07-16 MED ORDER — CHLORHEXIDINE GLUCONATE 0.12 % MT SOLN
OROMUCOSAL | Status: AC
  Filled 2024-07-16: qty 15

## 2024-07-16 MED ORDER — PROPOFOL 10 MG/ML IV BOLUS
INTRAVENOUS | Status: AC
  Filled 2024-07-16: qty 20

## 2024-07-16 MED ORDER — PROPOFOL 1000 MG/100ML IV EMUL
INTRAVENOUS | Status: AC
  Filled 2024-07-16: qty 100

## 2024-07-16 SURGICAL SUPPLY — 14 items
BAG DRAIN SIEMENS DORNER NS (MISCELLANEOUS) ×2 IMPLANT
BRUSH SCRUB EZ 4% CHG (MISCELLANEOUS) ×2 IMPLANT
CATH URETL OPEN END 6X70 (CATHETERS) ×1 IMPLANT
DRAPE UTILITY 15X26 TOWEL STRL (DRAPES) ×2 IMPLANT
GLOVE BIOGEL PI IND STRL 7.5 (GLOVE) ×2 IMPLANT
GOWN STRL REUS W/ TWL LRG LVL3 (GOWN DISPOSABLE) ×4 IMPLANT
GOWN STRL REUS W/ TWL XL LVL3 (GOWN DISPOSABLE) ×2 IMPLANT
GUIDEWIRE STR DUAL SENSOR (WIRE) ×2 IMPLANT
KIT TURNOVER CYSTO (KITS) ×2 IMPLANT
PACK CYSTO AR (MISCELLANEOUS) ×2 IMPLANT
SET CYSTO W/LG BORE CLAMP LF (SET/KITS/TRAYS/PACK) ×2 IMPLANT
SOL .9 NS 3000ML IRR UROMATIC (IV SOLUTION) ×2 IMPLANT
SURGILUBE 2OZ TUBE FLIPTOP (MISCELLANEOUS) ×2 IMPLANT
WATER STERILE IRR 500ML POUR (IV SOLUTION) ×2 IMPLANT

## 2024-07-16 NOTE — Op Note (Signed)
    Preoperative diagnosis:  Right hydronephrosis  Postoperative diagnosis:  Same  Procedure: Cystoscopy with stent removal  Surgeon: Glendia JAYSON Barba, MD  Anesthesia: MAC  Complications: None  Intraoperative findings:  Non-encrusted ureteral stent easily removed.  UOs gaping bilaterally.  No solid or papillary bladder mucosal lesions  EBL: 0 cc  Specimens: None  Indication: Heather Lopez is a 78 y.o. patient with a history of right hydronephrosis.  Ureteroscopy April 2023 showed no evidence of ureteral stricture and cystogram with bilateral vesicoureteral reflux.  Stent was removed at that time however was subsequently replaced due to a rising creatinine.  She is now on dialysis and is oliguric and has requested stent removal.  We have discussed possibility of infection post stent removal and she understands possible need for stent replacement or percutaneous nephrostomy.  She requested stent removal under sedation due to previous discomfort with office cystoscopy.  After reviewing the management options for treatment, she elected to proceed with the above surgical procedure(s). We have discussed the potential benefits and risks of the procedure, side effects of the proposed treatment, the likelihood of the patient achieving the goals of the procedure, and any potential problems that might occur during the procedure or recuperation. Informed consent has been obtained.  Description of procedure:  The patient was taken to the operating room and IV sedation was administered by anesthesia.  The patient was placed in the dorsal lithotomy position, prepped and draped in the usual sterile fashion, and preoperative antibiotics were administered. A preoperative time-out was performed.   A 21 French cystoscope sheath with obturator was passed per urethra.  A 30 degree lens was placed into the sheath and panendoscopy was performed.  The stent was grasped with endoscopic forceps and removed.  She  was then transported to the PACU in stable condition.  Plan: Instructed to call or go to [facility] ED for flank pain/fever   Glendia Barba, MD

## 2024-07-16 NOTE — Transfer of Care (Signed)
 Immediate Anesthesia Transfer of Care Note  Patient: Heather Lopez  Procedure(s) Performed: REMOVAL, STENT, URETER, CYSTOSCOPIC (Right: Ureter)  Patient Location: PACU  Anesthesia Type:General  Level of Consciousness: awake and patient cooperative  Airway & Oxygen Therapy: Patient Spontanous Breathing  Post-op Assessment: Report given to RN and Post -op Vital signs reviewed and stable  Post vital signs: Reviewed and stable  Last Vitals:  Vitals Value Taken Time  BP    Temp    Pulse 72 07/16/24 09:18  Resp 19 07/16/24 09:18  SpO2 98 % 07/16/24 09:18  Vitals shown include unfiled device data.  Last Pain:  Vitals:   07/16/24 0723  TempSrc: Tympanic  PainSc: 0-No pain         Complications: No notable events documented.

## 2024-07-16 NOTE — Anesthesia Preprocedure Evaluation (Addendum)
 Anesthesia Evaluation  Patient identified by MRN, date of birth, ID band Patient awake    Reviewed: Allergy & Precautions, NPO status , Patient's Chart, lab work & pertinent test results  History of Anesthesia Complications Negative for: history of anesthetic complications  Airway Mallampati: III  TM Distance: <3 FB Neck ROM: Full    Dental no notable dental hx. (+) Teeth Intact   Pulmonary neg sleep apnea, neg COPD, Patient abstained from smoking.Not current smoker, former smoker   Pulmonary exam normal breath sounds clear to auscultation       Cardiovascular Exercise Tolerance: Good hypertension, Pt. on medications (-) CAD and (-) Past MI (-) dysrhythmias  Rhythm:Regular Rate:Normal - Systolic murmurs TTE 2022: 1. Left ventricular ejection fraction, by estimation, is 55 to 60%. The  left ventricle has normal function. Left ventricular endocardial border  not optimally defined to evaluate regional wall motion. There is mild left  ventricular hypertrophy. Left  ventricular diastolic parameters were normal.   2. Right ventricular systolic function was not well visualized. The right  ventricular size is normal. Tricuspid regurgitation signal is inadequate  for assessing PA pressure.   3. Left atrial size was moderately dilated.   4. The mitral valve is abnormal. No evidence of mitral valve  regurgitation. No evidence of mitral stenosis.   5. The aortic valve is tricuspid. There is mild calcification of the  aortic valve. There is mild thickening of the aortic valve. Aortic valve  regurgitation is not visualized. Mild to moderate aortic valve  sclerosis/calcification is present, without any  evidence of aortic stenosis.     Neuro/Psych  PSYCHIATRIC DISORDERS Anxiety Depression    negative neurological ROS     GI/Hepatic PUD,GERD  Medicated and Controlled,,(+)     (-) substance abuse    Endo/Other  diabetes, Poorly  Controlled, Insulin  Dependent  Glucose elevated today, given 10u insulin   Renal/GU Last dialyzed yesterday. Potassium WNL today     Musculoskeletal   Abdominal   Peds  Hematology   Anesthesia Other Findings Past Medical History: 06/27/2021: Acute renal failure (HCC) No date: Anemia in chronic kidney disease No date: Anxiety No date: Aortic atherosclerosis (HCC) No date: Cataract No date: Depression No date: Dysrhythmia No date: ESRD on dialysis [facility]) No date: GERD (gastroesophageal reflux disease) No date: Gout No date: History of kidney stones No date: Hyperlipidemia No date: Hypertension, essential 12/02/2021: Nausea & vomiting No date: Obesity 06/2021: Obstruction of left ureteropelvic junction (UPJ) due to stone No date: Osteoarthritis 06/27/2021: Perforated duodenal ulcer (HCC) 06/2021: Pyonephrosis     Comment:  left 06/27/2021: Septic shock (HCC) No date: Spontaneous bacterial peritonitis (HCC) 08/2023: Subdural hematoma (HCC) No date: Type 2 diabetes mellitus with stage 5 chronic kidney disease  (HCC) No date: Vertigo  Reproductive/Obstetrics                              Anesthesia Physical Anesthesia Plan  ASA: 2  Anesthesia Plan: General ETT   Post-op Pain Management:    Induction: Intravenous  PONV Risk Score and Plan: 3 and Ondansetron , Dexamethasone , Propofol  infusion and TIVA  Airway Management Planned: Nasal Cannula and Natural Airway  Additional Equipment:   Intra-op Plan:   Post-operative Plan: Extubation in OR  Informed Consent: I have reviewed the patients History and Physical, chart, labs and discussed the procedure including the risks, benefits and alternatives for the proposed anesthesia with the patient or authorized representative who has indicated  his/her understanding and acceptance.     Dental Advisory Given  Plan Discussed with: Anesthesiologist, CRNA and Surgeon  Anesthesia Plan Comments:  (Patient consented for risks of anesthesia including but not limited to:  - adverse reactions to medications - damage to eyes, teeth, lips or other oral mucosa - nerve damage due to positioning  - sore throat or hoarseness - Damage to heart, brain, nerves, lungs, other parts of body or loss of life  Patient voiced understanding and assent.)         Anesthesia Quick Evaluation

## 2024-07-16 NOTE — Interval H&P Note (Signed)
 History and Physical Interval Note:  07/16/2024 7:15 AM  Jenkins FORBES [facility]  has presented today for surgery, with the diagnosis of Right Hydronephrosis.  The various methods of treatment have been discussed with the patient and family. After consideration of risks, benefits and other options for treatment, the patient has consented to  Procedure(s): REMOVAL, STENT, URETER, CYSTOSCOPIC (Right) CYSTOSCOPY, WITH RETROGRADE PYELOGRAM (Right) as a surgical intervention.  The patient's history has been reviewed, patient examined, no change in status, stable for surgery.  I have reviewed the patient's chart and labs.  Questions were answered to the patient's satisfaction.    CV:RRR Lungs:clear  Glendia JAYSON Barba

## 2024-07-16 NOTE — Anesthesia Postprocedure Evaluation (Signed)
 Anesthesia Post Note  Patient: Heather Lopez  Procedure(s) Performed: REMOVAL, STENT, URETER, CYSTOSCOPIC (Right: Ureter)  Patient location during evaluation: PACU Anesthesia Type: General Level of consciousness: awake and alert Pain management: pain level controlled Vital Signs Assessment: post-procedure vital signs reviewed and stable Respiratory status: spontaneous breathing, nonlabored ventilation, respiratory function stable and patient connected to nasal cannula oxygen Cardiovascular status: blood pressure returned to baseline and stable Postop Assessment: no apparent nausea or vomiting Anesthetic complications: no   No notable events documented.   Last Vitals:  Vitals:   07/16/24 0942 07/16/24 0950  BP: (!) 143/75 134/76  Pulse: 70 77  Resp: 20 20  Temp: (!) 36.1 C (!) 36 C  SpO2: 94% 93%    Last Pain:  Vitals:   07/16/24 0950  TempSrc: Tympanic  PainSc: 2                  Debby Mines

## 2024-07-17 ENCOUNTER — Encounter: Payer: Self-pay | Admitting: Urology

## 2024-09-02 ENCOUNTER — Other Ambulatory Visit (INDEPENDENT_AMBULATORY_CARE_PROVIDER_SITE_OTHER): Payer: Self-pay | Admitting: Nurse Practitioner

## 2024-09-02 DIAGNOSIS — N186 End stage renal disease: Secondary | ICD-10-CM

## 2024-09-03 ENCOUNTER — Encounter (INDEPENDENT_AMBULATORY_CARE_PROVIDER_SITE_OTHER): Payer: Self-pay | Admitting: Nurse Practitioner

## 2024-09-03 ENCOUNTER — Encounter: Payer: Self-pay | Admitting: Emergency Medicine

## 2024-09-03 ENCOUNTER — Other Ambulatory Visit: Payer: Self-pay

## 2024-09-03 ENCOUNTER — Ambulatory Visit (INDEPENDENT_AMBULATORY_CARE_PROVIDER_SITE_OTHER): Admitting: Nurse Practitioner

## 2024-09-03 ENCOUNTER — Emergency Department
Admission: EM | Admit: 2024-09-03 | Discharge: 2024-09-03 | Disposition: A | Discharge status: Home / Self Care | Attending: Emergency Medicine | Admitting: Emergency Medicine

## 2024-09-03 ENCOUNTER — Emergency Department

## 2024-09-03 ENCOUNTER — Ambulatory Visit (INDEPENDENT_AMBULATORY_CARE_PROVIDER_SITE_OTHER)

## 2024-09-03 VITALS — BP 135/67 | HR 72 | Resp 18 | Ht 63.0 in | Wt 217.4 lb

## 2024-09-03 DIAGNOSIS — E1122 Type 2 diabetes mellitus with diabetic chronic kidney disease: Secondary | ICD-10-CM

## 2024-09-03 DIAGNOSIS — Z992 Dependence on renal dialysis: Secondary | ICD-10-CM | POA: Insufficient documentation

## 2024-09-03 DIAGNOSIS — N186 End stage renal disease: Secondary | ICD-10-CM

## 2024-09-03 DIAGNOSIS — I10 Essential (primary) hypertension: Secondary | ICD-10-CM

## 2024-09-03 DIAGNOSIS — H538 Other visual disturbances: Secondary | ICD-10-CM | POA: Insufficient documentation

## 2024-09-03 DIAGNOSIS — I120 Hypertensive chronic kidney disease with stage 5 chronic kidney disease or end stage renal disease: Secondary | ICD-10-CM | POA: Diagnosis not present

## 2024-09-03 DIAGNOSIS — Z794 Long term (current) use of insulin: Secondary | ICD-10-CM

## 2024-09-03 LAB — COMPREHENSIVE METABOLIC PANEL WITH GFR
ALT: 21 U/L (ref 0–44)
AST: 22 U/L (ref 15–41)
Albumin: 3.8 g/dL (ref 3.5–5.0)
Alkaline Phosphatase: 162 U/L — ABNORMAL HIGH (ref 38–126)
Anion gap: 13 (ref 5–15)
BUN: 53 mg/dL — ABNORMAL HIGH (ref 8–23)
CO2: 25 mmol/L (ref 22–32)
Calcium: 9.3 mg/dL (ref 8.9–10.3)
Chloride: 89 mmol/L — ABNORMAL LOW (ref 98–111)
Creatinine, Ser: 7.12 mg/dL — ABNORMAL HIGH (ref 0.44–1.00)
GFR, Estimated: 6 mL/min — ABNORMAL LOW (ref >60)
Glucose, Bld: 270 mg/dL — ABNORMAL HIGH (ref 70–99)
Potassium: 4.9 mmol/L (ref 3.5–5.1)
Sodium: 127 mmol/L — ABNORMAL LOW (ref 135–145)
Total Bilirubin: 0.7 mg/dL (ref 0.0–1.2)
Total Protein: 7.2 g/dL (ref 6.5–8.1)

## 2024-09-03 LAB — DIFFERENTIAL
Abs Immature Granulocytes: 0.02 K/uL (ref 0.00–0.07)
Basophils Absolute: 0 K/uL (ref 0.0–0.1)
Basophils Relative: 1 %
Eosinophils Absolute: 0.2 K/uL (ref 0.0–0.5)
Eosinophils Relative: 3 %
Immature Granulocytes: 0 %
Lymphocytes Relative: 32 %
Lymphs Abs: 2 K/uL (ref 0.7–4.0)
Monocytes Absolute: 0.5 K/uL (ref 0.1–1.0)
Monocytes Relative: 9 %
Neutro Abs: 3.3 K/uL (ref 1.7–7.7)
Neutrophils Relative %: 55 %

## 2024-09-03 LAB — CBC
HCT: 31.3 % — ABNORMAL LOW (ref 36.0–46.0)
Hemoglobin: 10.4 g/dL — ABNORMAL LOW (ref 12.0–15.0)
MCH: 34.3 pg — ABNORMAL HIGH (ref 26.0–34.0)
MCHC: 33.2 g/dL (ref 30.0–36.0)
MCV: 103.3 fL — ABNORMAL HIGH (ref 80.0–100.0)
Platelets: 215 K/uL (ref 150–400)
RBC: 3.03 MIL/uL — ABNORMAL LOW (ref 3.87–5.11)
RDW: 15.6 % — ABNORMAL HIGH (ref 11.5–15.5)
WBC: 6.1 K/uL (ref 4.0–10.5)
nRBC: 0 % (ref 0.0–0.2)

## 2024-09-03 LAB — APTT: aPTT: 33 s (ref 24–36)

## 2024-09-03 LAB — CBG MONITORING, ED: Glucose-Capillary: 241 mg/dL — ABNORMAL HIGH (ref 70–99)

## 2024-09-03 LAB — ETHANOL: Alcohol, Ethyl (B): <15 mg/dL (ref <15)

## 2024-09-03 LAB — PROTIME-INR
INR: 1 (ref 0.8–1.2)
Prothrombin Time: 13.9 s (ref 11.4–15.2)

## 2024-09-03 MED ORDER — FLUORESCEIN SODIUM 1 MG OP STRP
1.0000 | ORAL_STRIP | Freq: Once | OPHTHALMIC | Status: AC
  Administered 2024-09-03: 1 via OPHTHALMIC
  Filled 2024-09-03: qty 1

## 2024-09-03 MED ORDER — TETRACAINE HCL 0.5 % OP SOLN
2.0000 [drp] | Freq: Once | OPHTHALMIC | Status: AC
  Administered 2024-09-03: 2 [drp] via OPHTHALMIC
  Filled 2024-09-03: qty 4

## 2024-09-03 MED ORDER — SODIUM CHLORIDE 0.9% FLUSH: 3.0000 mL | Freq: Once | INTRAVENOUS | Status: DC

## 2024-09-03 NOTE — ED Notes (Signed)
 Fall risk bundle is currently in place.

## 2024-09-03 NOTE — Consult Note (Signed)
 [facility] Telemedicine Consult   Requesting Provider: Willo Dunnings Consult Participants: Bedside nursing, patient Location of the provider: [place]  Location of the patient: [facility]  This consult was provided via telemedicine with 2-way video and audio communication. The patient/family was informed that care would be provided in this way and agreed to receive care in this manner.    Chief Complaint: Blurred vision  HPI: 78 year old female with a history of A-fib, ESRD, hyperlipidemia, hypertension, diabetes who presents with blurred vision of the left eye.  She states that the vision in her right eye has been getting blurred for some time but she still had 20/20 vision in her left eye until this evening around 6:00 at which point her left eye became blurred as well.  She can still see, is just that everything appears blurry.    LKW: 6 PM tnk given?: No, no clear indication of stroke IR Thrombectomy? No, mild symptoms Time of teleneurologist evaluation: 1908  Exam: Vitals:   09/03/24 1848  BP: (!) 162/77  Pulse: 70  Resp: 18  Temp: 98.1 F (36.7 C)  SpO2: 99%    General: in bed, nad  1A: Level of Consciousness - 0 1B: Ask Month and Age - 0 1C: 'Blink Eyes' & 'Squeeze Hands' - 0 2: Test Horizontal Extraocular Movements - 0 3: Test Visual Fields - 0 4: Test Facial Palsy - 0 5A: Test Left Arm Motor Drift - 0 5B: Test Right Arm Motor Drift - 0 6A: Test Left Leg Motor Drift - 0 6B: Test Right Leg Motor Drift - 0 7: Test Limb Ataxia - 0 8: Test Sensation - 0 9: Test Language/Aphasia- 0 10: Test Dysarthria - 0 11: Test Extinction/Inattention - 0 NIHSS score: 0   Imaging Reviewed:   Labs reviewed in epic and pertinent values follow: CBG 241   Assessment: 78 year old female with monocular vision change at 6 PM.  She states that it did not affect one side more than the other, but the whole eye is blurry.  This is not a typical presentation  for central retinal artery occlusion, and intracerebral pathology typically will affect one hemifield more than the other.  She is able to count fingers in all four fields.  At this point, my suspicion would be higher for an ophthalmologic cause, though if ophthalmological causes are ruled out, could still consider MRI of her brain.  Recommendations:  Would discuss with ophthalmology Could consider MRI brain if neuro-ophthalmological cause is found  Aisha Seals, MD [facility] [phone]  If 7pm- 7am, please page neurology on call as listed in AMION.

## 2024-09-03 NOTE — ED Provider Notes (Signed)
 [facility] Provider Note    Event Date/Time   First MD Initiated Contact with Patient 09/03/24 1904     (approximate)   History   Chief Complaint Blurred Vision   HPI  Heather Lopez is a 78 y.o. female with past medical history of hypertension, hyperlipidemia, diabetes, atrial fibrillation, and ESRD on HD (MWF) who presents to the ED complaining of blurred vision.  Patient reports that she has had about 2 months of blurry vision in her right eye, affecting the entire visual field on this side.  She then had acute onset of blurry vision in the left eye about an hour prior to arrival.  She denies pain in either eye and has not noticed any redness or drainage.  She wears reading glasses but otherwise does not wear glasses or contacts regularly.  She has not any speech changes, facial droop, numbness, or weakness with the symptoms.  Patient received a full run of dialysis yesterday without issue.     Physical Exam   Triage Vital Signs: ED Triage Vitals  Encounter Vitals Group     BP 09/03/24 1848 (!) 162/77     Girls Systolic BP Percentile --      Girls Diastolic BP Percentile --      Boys Systolic BP Percentile --      Boys Diastolic BP Percentile --      Pulse Rate 09/03/24 1848 70     Resp 09/03/24 1848 18     Temp 09/03/24 1848 98.1 F (36.7 C)     Temp Source 09/03/24 1848 Oral     SpO2 09/03/24 1848 99 %     Weight 09/03/24 1850 217 lb 6 oz (98.6 kg)     Height 09/03/24 1850 5' 3 (1.6 m)     Head Circumference --      Peak Flow --      Pain Score 09/03/24 1848 0     Pain Loc --      Pain Education --      Exclude from Growth Chart --     Most recent vital signs: Vitals:   09/03/24 2130 09/03/24 2148  BP: (!) 147/66   Pulse: 71   Resp: 14   Temp:  97.7 F (36.5 C)  SpO2: 95%     Constitutional: Alert and oriented. Eyes: Conjunctivae are normal.  Pupils equal, round, and reactive to light bilaterally.  Extraocular movements intact  without nystagmus. Head: Atraumatic. Nose: No congestion/rhinnorhea. Mouth/Throat: Mucous membranes are moist.  Cardiovascular: Normal rate, regular rhythm. Grossly normal heart sounds.  2+ radial pulses bilaterally.  Left upper extremity AV fistula with palpable thrill. Respiratory: Normal respiratory effort.  No retractions. Lungs CTAB. Gastrointestinal: Soft and nontender. No distention. Musculoskeletal: No lower extremity tenderness nor edema.  Neurologic:  Normal speech and language. No gross focal neurologic deficits are appreciated.    ED Results / Procedures / Treatments   Labs (all labs ordered are listed, but only abnormal results are displayed) Labs Reviewed  CBC - Abnormal; Notable for the following components:      Result Value   RBC 3.03 (*)    Hemoglobin 10.4 (*)    HCT 31.3 (*)    MCV 103.3 (*)    MCH 34.3 (*)    RDW 15.6 (*)    All other components within normal limits  COMPREHENSIVE METABOLIC PANEL WITH GFR - Abnormal; Notable for the following components:   Sodium 127 (*)  Chloride 89 (*)    Glucose, Bld 270 (*)    BUN 53 (*)    Creatinine, Ser 7.12 (*)    Alkaline Phosphatase 162 (*)    GFR, Estimated 6 (*)    All other components within normal limits  CBG MONITORING, ED - Abnormal; Notable for the following components:   Glucose-Capillary 241 (*)    All other components within normal limits  PROTIME-INR  APTT  DIFFERENTIAL  ETHANOL  CBG MONITORING, ED     EKG  ED ECG REPORT I, Carlin Palin, the attending physician, personally viewed and interpreted this ECG.   Date: 09/03/2024  EKG Time: 19:22  Rate: 73  Rhythm: normal sinus rhythm  Axis: Normal  Intervals:none  ST&T Change: None  RADIOLOGY CT head reviewed and interpreted by me with no hemorrhage or midline shift.  PROCEDURES:  Critical Care performed: No  Procedures   MEDICATIONS ORDERED IN ED: Medications  sodium chloride  flush (NS) 0.9 % injection 3 mL (3 mLs  Intravenous Not Given 09/03/24 1916)  fluorescein ophthalmic strip 1 strip (1 strip Both Eyes Given 09/03/24 2003)  tetracaine (PONTOCAINE) 0.5 % ophthalmic solution 2 drop (2 drops Both Eyes Given 09/03/24 2003)     IMPRESSION / MDM / ASSESSMENT AND PLAN / ED COURSE  I reviewed the triage vital signs and the nursing notes.                              78 y.o. female with past medical history of hypertension, hyperlipidemia, diabetes, atrial fibrillation, and ESRD on HD (MWF) who presents to the ED complaining of acute onset blurry vision in her left eye with 2 weeks of blurred vision in the right.  Patient's presentation is most consistent with acute presentation with potential threat to life or bodily function.  Differential diagnosis includes, but is not limited to, stroke, TIA, CRAO, CRVO, glaucoma, vitreous detachment, retinal detachment.  Patient nontoxic-appearing and in no acute distress, vital signs are unremarkable.  Code stroke activated on arrival due to concern for acute loss of vision in the left eye.  CT head is negative for acute process and patient evaluated by Dr. Michaela of neurology, who feels source of symptoms less likely to be intracranial, more likely to be ophthalmologic.  Intraocular pressures measured to be 8 in the left eye and 9 in the right eye, fluorescein exam is unremarkable without uptake.  Visual acuity 20/20 on the left, patient unable to read any part of the card with right eye.  Labs consistent with known ESRD, patient noted to have sodium level of 127, but this corrects to 130 when considering her hyperglycemia.  No significant electrolyte abnormality, anemia, or leukocytosis noted.  Case discussed with Dr. Georgia of ophthalmology, who recommends follow-up at the [facility] tomorrow, no further testing needed at this time.  Patient counseled to follow-up with the [facility] and to return to the ED for new or worsening symptoms, patient  agrees with plan.      FINAL CLINICAL IMPRESSION(S) / ED DIAGNOSES   Final diagnoses:  Blurred vision, bilateral     Rx / DC Orders   ED Discharge Orders     None        Note:  This document was prepared using Dragon voice recognition software and may include unintentional dictation errors.   Palin Carlin, MD 09/03/24 [phone]

## 2024-09-03 NOTE — ED Triage Notes (Signed)
 Pt reports blurred vision to left eye about an hour ago. Pt reports hx of impaired vision to right eye. Last dialysis tx yesterday. No weakness or facial droop noted.

## 2024-09-03 NOTE — ED Notes (Signed)
Pt taken to CT 2 

## 2024-09-03 NOTE — Progress Notes (Signed)
 Met [name] and husband Heather Lopez after Code stroke page. Provided presence and listening to couple share their love and history while waiting for evaluations and tests.

## 2024-09-03 NOTE — ED Notes (Signed)
Family updated as to patient's status at bedside 

## 2024-09-03 NOTE — ED Notes (Signed)
 Call placed to [facility] at [facility]

## 2024-09-03 NOTE — H&P (View-Only) (Signed)
 Subjective:    Patient ID: Jenkins FORBES Baptist, female    DOB: 03-17-1946, 78 y.o.   MRN: [id number] Chief Complaint  Patient presents with   Follow-up    3 month follow up + HDA    The patient returns to the office for followup of their dialysis access.   The patient reports the function of the access has been stable. Patient denies difficulty with cannulation. The patient denies increased bleeding time after removing the needles. The patient denies hand pain or other symptoms consistent with steal phenomena.  No significant arm swelling.  The patient denies any complaints from the dialysis center or their nephrologist.  The patient denies redness or swelling at the access site. The patient denies fever or chills at home or while on dialysis.  No recent shortening of the patient's walking distance or new symptoms consistent with claudication.  No history of rest pain symptoms. No new ulcers or wounds of the lower extremities have occurred.  The patient denies amaurosis fugax or recent TIA symptoms. There are no recent neurological changes noted. There is no history of DVT, PE or superficial thrombophlebitis. No recent episodes of angina or shortness of breath documented.   Duplex ultrasound of the AV access shows a patent access.  The previously noted stenosis is not significantly changed compared to last study.  Flow volume today is 443 cc/min (previous flow volume was 942 cc/min) it shows that there are 3 distinct areas of narrowing.  There appears to be a dumbbell type narrowing of the cephalic vein in the distal upper arm and distal to that stenosis that shows poor flow through the stent.      Review of Systems  Neurological:  Positive for weakness.  All other systems reviewed and are negative.      Objective:   Physical Exam Vitals reviewed.  HENT:     Head: Normocephalic.  Cardiovascular:     Rate and Rhythm: Normal rate.     Pulses: Normal pulses.  Pulmonary:     Effort:  Pulmonary effort is normal.  Skin:    General: Skin is warm and dry.  Neurological:     Mental Status: She is alert and oriented to person, place, and time.  Psychiatric:        Mood and Affect: Mood normal.        Behavior: Behavior normal.        Thought Content: Thought content normal.        Judgment: Judgment normal.     BP 135/67 (BP Location: Right Arm, Patient Position: Sitting, Cuff Size: Normal)   Pulse 72   Resp 18   Ht 5' 3 (1.6 m)   Wt 217 lb 6.4 oz (98.6 kg)   BMI 38.51 kg/m   Past Medical History:  Diagnosis Date   Acute renal failure 06/27/2021   Anemia in chronic kidney disease    Anxiety    Aortic atherosclerosis    Atrial fibrillation (HCC)    in 2022 while in hospital for sepsis   Cataract    Depression    Dysrhythmia    ESRD on dialysis (HCC)    M, W, F   GERD (gastroesophageal reflux disease)    Gout    History of kidney stones    Hydronephrosis of right kidney    Hyperlipidemia    Hypertension, essential    Nausea & vomiting 12/02/2021   Obesity    Obstruction of left ureteropelvic junction (UPJ) due to  stone 06/2021   Osteoarthritis    Perforated duodenal ulcer (HCC) 06/27/2021   Pyonephrosis 06/2021   left   Septic shock (HCC) 06/27/2021   Spontaneous bacterial peritonitis (HCC)    Subdural hematoma (HCC) 08/2023   Type 2 diabetes mellitus with stage 5 chronic kidney disease (HCC)    Vertigo     Social History   Socioeconomic History   Marital status: Married    Spouse name: Elspeth   Number of children: Not on file   Years of education: Not on file   Highest education level: Not on file  Occupational History   Not on file  Tobacco Use   Smoking status: Former    Current packs/day: 0.00    Average packs/day: 0.8 packs/day for 6.0 years (4.5 ttl pk-yrs)    Types: Cigarettes    Start date: 110    Quit date: 37    Years since quitting: 52.8   Smokeless tobacco: Never   Tobacco comments:    quit 1973  Vaping Use    Vaping status: Never Used  Substance and Sexual Activity   Alcohol use: No    Alcohol/week: 0.0 standard drinks of alcohol   Drug use: No   Sexual activity: Not on file  Other Topics Concern   Not on file  Social History Narrative   Not on file   Social Drivers of Health   Financial Resource Strain: Low Risk  (04/30/2024)   Received from [facility]   Overall Financial Resource Strain (CARDIA)    Difficulty of Paying Living Expenses: Not hard at all  Food Insecurity: No Food Insecurity (04/30/2024)   Received from [facility]   Hunger Vital Sign    Within the past 12 months, you worried that your food would run out before you got the money to buy more.: Never true    Within the past 12 months, the food you bought just didn't last and you didn't have money to get more.: Never true  Transportation Needs: No Transportation Needs (04/30/2024)   Received from [facility] - Transportation    In the past 12 months, has lack of transportation kept you from medical appointments or from getting medications?: No    Lack of Transportation (Non-Medical): No  Physical Activity: Unknown (07/14/2023)   Received from [facility]   Exercise Vital Sign    On average, how many days per week do you engage in moderate to strenuous exercise (like a brisk walk)?: 0 days    Minutes of Exercise per Session: Not on file  Stress: No Stress Concern Present (07/14/2023)   Received from [facility] - Occupational Stress Questionnaire    Feeling of Stress : Not at all  Social Connections: Socially Integrated (07/14/2023)   Received from [facility]   Social Network    How would you rate your social network (family, work, friends)?: Good participation with social networks  Intimate Partner Violence: Not At Risk (09/17/2023)   Humiliation, Afraid, Rape, and Kick questionnaire    Fear of Current or  Ex-Partner: No    Emotionally Abused: No    Physically Abused: No    Sexually Abused: No    Past Surgical History:  Procedure Laterality Date   A/V FISTULAGRAM Left 04/23/2024   Procedure: A/V Fistulagram;  Surgeon: Marea Selinda RAMAN, MD;  Location: [facility];  Service: Cardiovascular;  Laterality: Left;  AV FISTULA PLACEMENT Left 01/09/2024   Procedure: ARTERIOVENOUS (AV) FISTULA CREATION (BRACHIAL CEPHALIC);  Surgeon: Marea Selinda RAMAN, MD;  Location: [facility];  Service: Vascular;  Laterality: Left;   CAPD REMOVAL N/A 08/02/2023   Procedure: CONTINUOUS AMBULATORY PERITONEAL DIALYSIS  (CAPD) CATHETER REMOVAL;  Surgeon: Jordis Laneta FALCON, MD;  Location: [facility];  Service: General;  Laterality: N/A;   CATARACT EXTRACTION, BILATERAL Bilateral 2019   COLONOSCOPY WITH PROPOFOL  N/A 08/19/2015   Procedure: COLONOSCOPY WITH PROPOFOL ;  Surgeon: Rogelia Copping, MD;  Location: [facility];  Service: Endoscopy;  Laterality: N/A;  diabetic - insulin    CYSTOSCOPY W/ RETROGRADES Bilateral 12/22/2019   Procedure: CYSTOSCOPY WITH RETROGRADE PYELOGRAM;  Surgeon: Twylla Glendia BROCKS, MD;  Location: [facility];  Service: Urology;  Laterality: Bilateral;   CYSTOSCOPY W/ URETERAL STENT PLACEMENT Bilateral 07/05/2021   Procedure: CYSTOSCOPY WITH RETROGRADE PYELOGRAM/BILATERAL URETERAL STENT PLACEMENT;  Surgeon: Twylla Glendia BROCKS, MD;  Location: [facility];  Service: Urology;  Laterality: Bilateral;   CYSTOSCOPY W/ URETERAL STENT PLACEMENT Bilateral 12/26/2021   Procedure: CYSTOSCOPY WITH STENT REMOVAL;  Surgeon: Twylla Glendia BROCKS, MD;  Location: [facility];  Service: Urology;  Laterality: Bilateral;   CYSTOSCOPY W/ URETERAL STENT PLACEMENT Right 03/13/2022   Procedure: CYSTOSCOPY WITH RETROGRADE PYELOGRAM/URETERAL STENT PLACEMENT;  Surgeon: Twylla Glendia BROCKS, MD;  Location: [facility];  Service: Urology;  Laterality: Right;   CYSTOSCOPY W/ URETERAL STENT PLACEMENT Right 01/01/2023   Procedure: CYSTOSCOPY WITH STENT  EXCHANGE;  Surgeon: Twylla Glendia BROCKS, MD;  Location: [facility];  Service: Urology;  Laterality: Right;   CYSTOSCOPY W/ URETERAL STENT REMOVAL Right 05/08/2022   Procedure: CYSTOSCOPY WITH STENT REMOVAL;  Surgeon: Twylla Glendia BROCKS, MD;  Location: [facility];  Service: Urology;  Laterality: Right;   CYSTOSCOPY W/ URETERAL STENT REMOVAL Right 07/16/2024   Procedure: REMOVAL, STENT, URETER, CYSTOSCOPIC;  Surgeon: Twylla Glendia BROCKS, MD;  Location: [facility];  Service: Urology;  Laterality: Right;   CYSTOSCOPY WITH BIOPSY N/A 12/22/2019   Procedure: CYSTOSCOPY WITH bladder BIOPSY;  Surgeon: Twylla Glendia BROCKS, MD;  Location: [facility];  Service: Urology;  Laterality: N/A;   CYSTOSCOPY WITH BIOPSY  03/13/2022   Procedure: CYSTOSCOPY WITH BLADDER BIOPSY;  Surgeon: Twylla Glendia BROCKS, MD;  Location: [facility];  Service: Urology;;   CYSTOSCOPY WITH STENT PLACEMENT Right 12/22/2019   Procedure: CYSTOSCOPY WITH STENT PLACEMENT;  Surgeon: Twylla Glendia BROCKS, MD;  Location: [facility];  Service: Urology;  Laterality: Right;   CYSTOSCOPY WITH STENT PLACEMENT Right 07/03/2022   Procedure: CYSTOSCOPY/RIGHT URETEROSCOPY WITH STENT PLACEMENT;  Surgeon: Twylla Glendia BROCKS, MD;  Location: [facility];  Service: Urology;  Laterality: Right;   CYSTOSCOPY WITH URETEROSCOPY Bilateral 12/22/2019   Procedure: CYSTOSCOPY WITH URETEROSCOPY;  Surgeon: Twylla Glendia BROCKS, MD;  Location: [facility];  Service: Urology;  Laterality: Bilateral;   CYSTOSCOPY/URETEROSCOPY/HOLMIUM LASER/STENT PLACEMENT Right 12/22/2019   Procedure: CYSTOSCOPY/URETEROSCOPY/HOLMIUM LASER/STENT PLACEMENT;  Surgeon: Twylla Glendia BROCKS, MD;  Location: [facility];  Service: Urology;  Laterality: Right;   CYSTOSCOPY/URETEROSCOPY/HOLMIUM LASER/STENT PLACEMENT Bilateral 12/08/2021   Procedure: CYSTOSCOPY/URETEROSCOPY/HOLMIUM LASER/STENT PLACEMENT;  Surgeon: Francisca Redell BROCKS, MD;  Location: [facility];  Service: Urology;  Laterality: Bilateral;   DIALYSIS/PERMA CATHETER INSERTION N/A  06/29/2021   Procedure: DIALYSIS/PERMA CATHETER INSERTION;  Surgeon: Marea Selinda RAMAN, MD;  Location: [facility];  Service: Cardiovascular;  Laterality: N/A;   DIALYSIS/PERMA CATHETER INSERTION N/A 07/13/2021   Procedure: DIALYSIS/PERMA CATHETER INSERTION;  Surgeon: Marea Selinda RAMAN, MD;  Location: [facility];  Service: Cardiovascular;  Laterality:  N/A;   DIALYSIS/PERMA CATHETER INSERTION N/A 08/01/2023   Procedure: DIALYSIS/PERMA CATHETER INSERTION;  Surgeon: Marea Selinda RAMAN, MD;  Location: [facility];  Service: Cardiovascular;  Laterality: N/A;   DIALYSIS/PERMA CATHETER REMOVAL N/A 06/18/2024   Procedure: DIALYSIS/PERMA CATHETER REMOVAL;  Surgeon: Marea Selinda RAMAN, MD;  Location: [facility];  Service: Cardiovascular;  Laterality: N/A;   EYE SURGERY     INCISIONAL HERNIA REPAIR  06/28/2023   IR GASTROSTOMY TUBE MOD SED  08/01/2021   LAPAROTOMY N/A 06/29/2021   Procedure: EXPLORATORY LAPAROTOMY WITH REPAIR OF DUODENAL PERFORATION;  Surgeon: Desiderio Schanz, MD;  Location: [facility];  Service: General;  Laterality: N/A;   POLYPECTOMY  08/19/2015   Procedure: POLYPECTOMY;  Surgeon: Rogelia Copping, MD;  Location: [facility];  Service: Endoscopy;;   TUBAL LIGATION  1992    Family History  Problem Relation Age of Onset   Diabetes Father    Cancer Father        lung cancer   Diabetes Brother    Stroke Mother     Allergies  Allergen Reactions   Contrast Media  [Iodinated Contrast Media] Anaphylaxis   Iodine  Swelling    (IV only) - angioedema       Latest Ref Rng & Units 07/16/2024    7:20 AM 07/10/2024    1:38 PM 01/09/2024    6:29 AM  CBC  WBC 4.0 - 10.5 K/uL  6.1    Hemoglobin 12.0 - 15.0 g/dL 87.7  88.1  86.0   Hematocrit 36.0 - 46.0 % 36.0  34.7  41.0   Platelets 150 - 400 K/uL  221        CMP     Component Value Date/Time   NA 133 (L) 07/16/2024 0720   NA 138 10/03/2022 1350   NA 133 (L) 01/29/2014 0406   K 4.0 07/16/2024 0720   K 4.0  01/29/2014 0406   CL 94 (L) 07/16/2024 0720   CL 96 (L) 01/29/2014 0406   CO2 22 12/03/2023 0954   CO2 29 01/29/2014 0406   GLUCOSE 177 (H) 07/16/2024 0720   GLUCOSE 377 (H) 01/29/2014 0406   BUN 32 (H) 07/16/2024 0720   BUN 39 (H) 10/03/2022 1350   BUN 31 (H) 01/29/2014 0406   CREATININE 5.00 (H) 07/16/2024 0720   CREATININE 1.17 (H) 03/17/2018 0830   CALCIUM  8.6 (L) 12/03/2023 0954   CALCIUM  9.7 01/29/2014 0406   PROT 7.2 09/17/2023 0530   PROT 6.9 02/10/2022 0939   PROT 8.1 01/27/2014 1318   ALBUMIN  2.6 (L) 09/21/2023 0736   ALBUMIN  3.8 02/10/2022 0939   ALBUMIN  4.1 01/27/2014 1318   AST 36 09/17/2023 0530   AST 36 01/27/2014 1318   ALT 33 09/17/2023 0530   ALT 30 01/27/2014 1318   ALKPHOS 113 09/17/2023 0530   ALKPHOS 110 01/27/2014 1318   BILITOT 0.6 09/17/2023 0530   BILITOT <0.2 02/10/2022 0939   BILITOT 0.5 01/27/2014 1318   EGFR 18 (L) 10/03/2022 1350   GFRNONAA 11 (L) 12/03/2023 0954   GFRNONAA 48 (L) 08/21/2016 0908     No results found.     Assessment & Plan:   1. ESRD (end stage renal disease) (HCC) (Primary) Recommend:  The patient is experiencing increasing problems with their dialysis access.  Patient should have a fistulagram with the intention for intervention.  The intention for intervention is to restore appropriate flow and prevent thrombosis and possible loss of the access.  As well as  improve the quality of dialysis therapy.  The risks, benefits and alternative therapies were reviewed in detail with the patient.  All questions were answered.  The patient agrees to proceed with angio/intervention.    The patient will follow up with me in the office after the procedure.   2. Type 2 diabetes mellitus with chronic kidney disease on chronic dialysis, with long-term current use of insulin  (HCC) Continue hypoglycemic medications as already ordered, these medications have been reviewed and there are no changes at this time.  Hgb A1C to be  monitored as already arranged by primary service  3. Essential hypertension Continue antihypertensive medications as already ordered, these medications have been reviewed and there are no changes at this time.   Current Outpatient Medications on File Prior to Visit  Medication Sig Dispense Refill   aspirin  81 MG EC tablet Take 81 mg by mouth daily.     atorvastatin  (LIPITOR) 20 MG tablet Take 20 mg by mouth at bedtime.     buPROPion  (WELLBUTRIN  XL) 150 MG 24 hr tablet Take 150 mg by mouth every morning.     busPIRone  (BUSPAR ) 5 MG tablet Take 5 mg by mouth every morning.     cloNIDine  (CATAPRES ) 0.1 MG tablet Take 1 tablet (0.1 mg total) by mouth 2 (two) times daily. 60 tablet 11   Dulaglutide  3 MG/0.5ML SOAJ Inject 3 mg into the skin once a week. Sundays     Insulin  Aspart FlexPen (NOVOLOG ) 100 UNIT/ML Inject 14 Units into the skin 2 (two) times daily.     insulin  glargine (LANTUS ) 100 UNIT/ML injection Inject 0.15 mLs (15 Units total) into the skin at bedtime. (Patient taking differently: Inject 30 Units into the skin at bedtime.) 10 mL 11   irbesartan (AVAPRO) 150 MG tablet Take 150 mg by mouth daily.     loperamide  (IMODIUM  A-D) 2 MG tablet Take 2 mg by mouth every 6 (six) hours as needed for diarrhea or loose stools.     metoprolol  tartrate (LOPRESSOR ) 100 MG tablet Take 1 tablet (100 mg total) by mouth 2 (two) times daily. 60 tablet 2   Multiple Vitamin (MULTIVITAMIN WITH MINERALS) TABS tablet Take 1 tablet by mouth daily. 30 tablet 3   sevelamer carbonate (RENVELA) 800 MG tablet Take 1,600 mg by mouth 3 (three) times daily.     trospium  (SANCTURA ) 20 MG tablet Take 1 tablet (20 mg total) by mouth 2 (two) times daily. 90 tablet 3   No current facility-administered medications on file prior to visit.    There are no Patient Instructions on file for this visit. No follow-ups on file.   Azuree Minish E Corena Tilson, NP

## 2024-09-03 NOTE — ED Notes (Signed)
 Code Stroke called to [facility]  spoke with Kianna

## 2024-09-03 NOTE — Progress Notes (Signed)
 Subjective:    Patient ID: Heather Lopez Baptist, female    DOB: 03-17-1946, 78 y.o.   MRN: [id number] Chief Complaint  Patient presents with   Follow-up    3 month follow up + HDA    The patient returns to the office for followup of their dialysis access.   The patient reports the function of the access has been stable. Patient denies difficulty with cannulation. The patient denies increased bleeding time after removing the needles. The patient denies hand pain or other symptoms consistent with steal phenomena.  No significant arm swelling.  The patient denies any complaints from the dialysis center or their nephrologist.  The patient denies redness or swelling at the access site. The patient denies fever or chills at home or while on dialysis.  No recent shortening of the patient's walking distance or new symptoms consistent with claudication.  No history of rest pain symptoms. No new ulcers or wounds of the lower extremities have occurred.  The patient denies amaurosis fugax or recent TIA symptoms. There are no recent neurological changes noted. There is no history of DVT, PE or superficial thrombophlebitis. No recent episodes of angina or shortness of breath documented.   Duplex ultrasound of the AV access shows a patent access.  The previously noted stenosis is not significantly changed compared to last study.  Flow volume today is 443 cc/min (previous flow volume was 942 cc/min) it shows that there are 3 distinct areas of narrowing.  There appears to be a dumbbell type narrowing of the cephalic vein in the distal upper arm and distal to that stenosis that shows poor flow through the stent.      Review of Systems  Neurological:  Positive for weakness.  All other systems reviewed and are negative.      Objective:   Physical Exam Vitals reviewed.  HENT:     Head: Normocephalic.  Cardiovascular:     Rate and Rhythm: Normal rate.     Pulses: Normal pulses.  Pulmonary:     Effort:  Pulmonary effort is normal.  Skin:    General: Skin is warm and dry.  Neurological:     Mental Status: She is alert and oriented to person, place, and time.  Psychiatric:        Mood and Affect: Mood normal.        Behavior: Behavior normal.        Thought Content: Thought content normal.        Judgment: Judgment normal.     BP 135/67 (BP Location: Right Arm, Patient Position: Sitting, Cuff Size: Normal)   Pulse 72   Resp 18   Ht 5' 3 (1.6 m)   Wt 217 lb 6.4 oz (98.6 kg)   BMI 38.51 kg/m   Past Medical History:  Diagnosis Date   Acute renal failure 06/27/2021   Anemia in chronic kidney disease    Anxiety    Aortic atherosclerosis    Atrial fibrillation (HCC)    in 2022 while in hospital for sepsis   Cataract    Depression    Dysrhythmia    ESRD on dialysis (HCC)    M, W, F   GERD (gastroesophageal reflux disease)    Gout    History of kidney stones    Hydronephrosis of right kidney    Hyperlipidemia    Hypertension, essential    Nausea & vomiting 12/02/2021   Obesity    Obstruction of left ureteropelvic junction (UPJ) due to  stone 06/2021   Osteoarthritis    Perforated duodenal ulcer (HCC) 06/27/2021   Pyonephrosis 06/2021   left   Septic shock (HCC) 06/27/2021   Spontaneous bacterial peritonitis (HCC)    Subdural hematoma (HCC) 08/2023   Type 2 diabetes mellitus with stage 5 chronic kidney disease (HCC)    Vertigo     Social History   Socioeconomic History   Marital status: Married    Spouse name: Elspeth   Number of children: Not on file   Years of education: Not on file   Highest education level: Not on file  Occupational History   Not on file  Tobacco Use   Smoking status: Former    Current packs/day: 0.00    Average packs/day: 0.8 packs/day for 6.0 years (4.5 ttl pk-yrs)    Types: Cigarettes    Start date: 110    Quit date: 37    Years since quitting: 52.8   Smokeless tobacco: Never   Tobacco comments:    quit 1973  Vaping Use    Vaping status: Never Used  Substance and Sexual Activity   Alcohol use: No    Alcohol/week: 0.0 standard drinks of alcohol   Drug use: No   Sexual activity: Not on file  Other Topics Concern   Not on file  Social History Narrative   Not on file   Social Drivers of Health   Financial Resource Strain: Low Risk  (04/30/2024)   Received from [facility]   Overall Financial Resource Strain (CARDIA)    Difficulty of Paying Living Expenses: Not hard at all  Food Insecurity: No Food Insecurity (04/30/2024)   Received from [facility]   Hunger Vital Sign    Within the past 12 months, you worried that your food would run out before you got the money to buy more.: Never true    Within the past 12 months, the food you bought just didn't last and you didn't have money to get more.: Never true  Transportation Needs: No Transportation Needs (04/30/2024)   Received from [facility] - Transportation    In the past 12 months, has lack of transportation kept you from medical appointments or from getting medications?: No    Lack of Transportation (Non-Medical): No  Physical Activity: Unknown (07/14/2023)   Received from [facility]   Exercise Vital Sign    On average, how many days per week do you engage in moderate to strenuous exercise (like a brisk walk)?: 0 days    Minutes of Exercise per Session: Not on file  Stress: No Stress Concern Present (07/14/2023)   Received from [facility] - Occupational Stress Questionnaire    Feeling of Stress : Not at all  Social Connections: Socially Integrated (07/14/2023)   Received from [facility]   Social Network    How would you rate your social network (family, work, friends)?: Good participation with social networks  Intimate Partner Violence: Not At Risk (09/17/2023)   Humiliation, Afraid, Rape, and Kick questionnaire    Fear of Current or  Ex-Partner: No    Emotionally Abused: No    Physically Abused: No    Sexually Abused: No    Past Surgical History:  Procedure Laterality Date   A/V FISTULAGRAM Left 04/23/2024   Procedure: A/V Fistulagram;  Surgeon: Marea Selinda RAMAN, MD;  Location: [facility];  Service: Cardiovascular;  Laterality: Left;  AV FISTULA PLACEMENT Left 01/09/2024   Procedure: ARTERIOVENOUS (AV) FISTULA CREATION (BRACHIAL CEPHALIC);  Surgeon: Marea Selinda RAMAN, MD;  Location: [facility];  Service: Vascular;  Laterality: Left;   CAPD REMOVAL N/A 08/02/2023   Procedure: CONTINUOUS AMBULATORY PERITONEAL DIALYSIS  (CAPD) CATHETER REMOVAL;  Surgeon: Jordis Laneta FALCON, MD;  Location: [facility];  Service: General;  Laterality: N/A;   CATARACT EXTRACTION, BILATERAL Bilateral 2019   COLONOSCOPY WITH PROPOFOL  N/A 08/19/2015   Procedure: COLONOSCOPY WITH PROPOFOL ;  Surgeon: Rogelia Copping, MD;  Location: [facility];  Service: Endoscopy;  Laterality: N/A;  diabetic - insulin    CYSTOSCOPY W/ RETROGRADES Bilateral 12/22/2019   Procedure: CYSTOSCOPY WITH RETROGRADE PYELOGRAM;  Surgeon: Twylla Glendia BROCKS, MD;  Location: [facility];  Service: Urology;  Laterality: Bilateral;   CYSTOSCOPY W/ URETERAL STENT PLACEMENT Bilateral 07/05/2021   Procedure: CYSTOSCOPY WITH RETROGRADE PYELOGRAM/BILATERAL URETERAL STENT PLACEMENT;  Surgeon: Twylla Glendia BROCKS, MD;  Location: [facility];  Service: Urology;  Laterality: Bilateral;   CYSTOSCOPY W/ URETERAL STENT PLACEMENT Bilateral 12/26/2021   Procedure: CYSTOSCOPY WITH STENT REMOVAL;  Surgeon: Twylla Glendia BROCKS, MD;  Location: [facility];  Service: Urology;  Laterality: Bilateral;   CYSTOSCOPY W/ URETERAL STENT PLACEMENT Right 03/13/2022   Procedure: CYSTOSCOPY WITH RETROGRADE PYELOGRAM/URETERAL STENT PLACEMENT;  Surgeon: Twylla Glendia BROCKS, MD;  Location: [facility];  Service: Urology;  Laterality: Right;   CYSTOSCOPY W/ URETERAL STENT PLACEMENT Right 01/01/2023   Procedure: CYSTOSCOPY WITH STENT  EXCHANGE;  Surgeon: Twylla Glendia BROCKS, MD;  Location: [facility];  Service: Urology;  Laterality: Right;   CYSTOSCOPY W/ URETERAL STENT REMOVAL Right 05/08/2022   Procedure: CYSTOSCOPY WITH STENT REMOVAL;  Surgeon: Twylla Glendia BROCKS, MD;  Location: [facility];  Service: Urology;  Laterality: Right;   CYSTOSCOPY W/ URETERAL STENT REMOVAL Right 07/16/2024   Procedure: REMOVAL, STENT, URETER, CYSTOSCOPIC;  Surgeon: Twylla Glendia BROCKS, MD;  Location: [facility];  Service: Urology;  Laterality: Right;   CYSTOSCOPY WITH BIOPSY N/A 12/22/2019   Procedure: CYSTOSCOPY WITH bladder BIOPSY;  Surgeon: Twylla Glendia BROCKS, MD;  Location: [facility];  Service: Urology;  Laterality: N/A;   CYSTOSCOPY WITH BIOPSY  03/13/2022   Procedure: CYSTOSCOPY WITH BLADDER BIOPSY;  Surgeon: Twylla Glendia BROCKS, MD;  Location: [facility];  Service: Urology;;   CYSTOSCOPY WITH STENT PLACEMENT Right 12/22/2019   Procedure: CYSTOSCOPY WITH STENT PLACEMENT;  Surgeon: Twylla Glendia BROCKS, MD;  Location: [facility];  Service: Urology;  Laterality: Right;   CYSTOSCOPY WITH STENT PLACEMENT Right 07/03/2022   Procedure: CYSTOSCOPY/RIGHT URETEROSCOPY WITH STENT PLACEMENT;  Surgeon: Twylla Glendia BROCKS, MD;  Location: [facility];  Service: Urology;  Laterality: Right;   CYSTOSCOPY WITH URETEROSCOPY Bilateral 12/22/2019   Procedure: CYSTOSCOPY WITH URETEROSCOPY;  Surgeon: Twylla Glendia BROCKS, MD;  Location: [facility];  Service: Urology;  Laterality: Bilateral;   CYSTOSCOPY/URETEROSCOPY/HOLMIUM LASER/STENT PLACEMENT Right 12/22/2019   Procedure: CYSTOSCOPY/URETEROSCOPY/HOLMIUM LASER/STENT PLACEMENT;  Surgeon: Twylla Glendia BROCKS, MD;  Location: [facility];  Service: Urology;  Laterality: Right;   CYSTOSCOPY/URETEROSCOPY/HOLMIUM LASER/STENT PLACEMENT Bilateral 12/08/2021   Procedure: CYSTOSCOPY/URETEROSCOPY/HOLMIUM LASER/STENT PLACEMENT;  Surgeon: Francisca Redell BROCKS, MD;  Location: [facility];  Service: Urology;  Laterality: Bilateral;   DIALYSIS/PERMA CATHETER INSERTION N/A  06/29/2021   Procedure: DIALYSIS/PERMA CATHETER INSERTION;  Surgeon: Marea Selinda RAMAN, MD;  Location: [facility];  Service: Cardiovascular;  Laterality: N/A;   DIALYSIS/PERMA CATHETER INSERTION N/A 07/13/2021   Procedure: DIALYSIS/PERMA CATHETER INSERTION;  Surgeon: Marea Selinda RAMAN, MD;  Location: [facility];  Service: Cardiovascular;  Laterality:  N/A;   DIALYSIS/PERMA CATHETER INSERTION N/A 08/01/2023   Procedure: DIALYSIS/PERMA CATHETER INSERTION;  Surgeon: Marea Selinda RAMAN, MD;  Location: [facility];  Service: Cardiovascular;  Laterality: N/A;   DIALYSIS/PERMA CATHETER REMOVAL N/A 06/18/2024   Procedure: DIALYSIS/PERMA CATHETER REMOVAL;  Surgeon: Marea Selinda RAMAN, MD;  Location: [facility];  Service: Cardiovascular;  Laterality: N/A;   EYE SURGERY     INCISIONAL HERNIA REPAIR  06/28/2023   IR GASTROSTOMY TUBE MOD SED  08/01/2021   LAPAROTOMY N/A 06/29/2021   Procedure: EXPLORATORY LAPAROTOMY WITH REPAIR OF DUODENAL PERFORATION;  Surgeon: Desiderio Schanz, MD;  Location: [facility];  Service: General;  Laterality: N/A;   POLYPECTOMY  08/19/2015   Procedure: POLYPECTOMY;  Surgeon: Rogelia Copping, MD;  Location: [facility];  Service: Endoscopy;;   TUBAL LIGATION  1992    Family History  Problem Relation Age of Onset   Diabetes Father    Cancer Father        lung cancer   Diabetes Brother    Stroke Mother     Allergies  Allergen Reactions   Contrast Media  [Iodinated Contrast Media] Anaphylaxis   Iodine  Swelling    (IV only) - angioedema       Latest Ref Rng & Units 07/16/2024    7:20 AM 07/10/2024    1:38 PM 01/09/2024    6:29 AM  CBC  WBC 4.0 - 10.5 K/uL  6.1    Hemoglobin 12.0 - 15.0 g/dL 87.7  88.1  86.0   Hematocrit 36.0 - 46.0 % 36.0  34.7  41.0   Platelets 150 - 400 K/uL  221        CMP     Component Value Date/Time   NA 133 (L) 07/16/2024 0720   NA 138 10/03/2022 1350   NA 133 (L) 01/29/2014 0406   K 4.0 07/16/2024 0720   K 4.0  01/29/2014 0406   CL 94 (L) 07/16/2024 0720   CL 96 (L) 01/29/2014 0406   CO2 22 12/03/2023 0954   CO2 29 01/29/2014 0406   GLUCOSE 177 (H) 07/16/2024 0720   GLUCOSE 377 (H) 01/29/2014 0406   BUN 32 (H) 07/16/2024 0720   BUN 39 (H) 10/03/2022 1350   BUN 31 (H) 01/29/2014 0406   CREATININE 5.00 (H) 07/16/2024 0720   CREATININE 1.17 (H) 03/17/2018 0830   CALCIUM  8.6 (L) 12/03/2023 0954   CALCIUM  9.7 01/29/2014 0406   PROT 7.2 09/17/2023 0530   PROT 6.9 02/10/2022 0939   PROT 8.1 01/27/2014 1318   ALBUMIN  2.6 (L) 09/21/2023 0736   ALBUMIN  3.8 02/10/2022 0939   ALBUMIN  4.1 01/27/2014 1318   AST 36 09/17/2023 0530   AST 36 01/27/2014 1318   ALT 33 09/17/2023 0530   ALT 30 01/27/2014 1318   ALKPHOS 113 09/17/2023 0530   ALKPHOS 110 01/27/2014 1318   BILITOT 0.6 09/17/2023 0530   BILITOT <0.2 02/10/2022 0939   BILITOT 0.5 01/27/2014 1318   EGFR 18 (L) 10/03/2022 1350   GFRNONAA 11 (L) 12/03/2023 0954   GFRNONAA 48 (L) 08/21/2016 0908     No results found.     Assessment & Plan:   1. ESRD (end stage renal disease) (HCC) (Primary) Recommend:  The patient is experiencing increasing problems with their dialysis access.  Patient should have a fistulagram with the intention for intervention.  The intention for intervention is to restore appropriate flow and prevent thrombosis and possible loss of the access.  As well as  improve the quality of dialysis therapy.  The risks, benefits and alternative therapies were reviewed in detail with the patient.  All questions were answered.  The patient agrees to proceed with angio/intervention.    The patient will follow up with me in the office after the procedure.   2. Type 2 diabetes mellitus with chronic kidney disease on chronic dialysis, with long-term current use of insulin  (HCC) Continue hypoglycemic medications as already ordered, these medications have been reviewed and there are no changes at this time.  Hgb A1C to be  monitored as already arranged by primary service  3. Essential hypertension Continue antihypertensive medications as already ordered, these medications have been reviewed and there are no changes at this time.   Current Outpatient Medications on File Prior to Visit  Medication Sig Dispense Refill   aspirin  81 MG EC tablet Take 81 mg by mouth daily.     atorvastatin  (LIPITOR) 20 MG tablet Take 20 mg by mouth at bedtime.     buPROPion  (WELLBUTRIN  XL) 150 MG 24 hr tablet Take 150 mg by mouth every morning.     busPIRone  (BUSPAR ) 5 MG tablet Take 5 mg by mouth every morning.     cloNIDine  (CATAPRES ) 0.1 MG tablet Take 1 tablet (0.1 mg total) by mouth 2 (two) times daily. 60 tablet 11   Dulaglutide  3 MG/0.5ML SOAJ Inject 3 mg into the skin once a week. Sundays     Insulin  Aspart FlexPen (NOVOLOG ) 100 UNIT/ML Inject 14 Units into the skin 2 (two) times daily.     insulin  glargine (LANTUS ) 100 UNIT/ML injection Inject 0.15 mLs (15 Units total) into the skin at bedtime. (Patient taking differently: Inject 30 Units into the skin at bedtime.) 10 mL 11   irbesartan (AVAPRO) 150 MG tablet Take 150 mg by mouth daily.     loperamide  (IMODIUM  A-D) 2 MG tablet Take 2 mg by mouth every 6 (six) hours as needed for diarrhea or loose stools.     metoprolol  tartrate (LOPRESSOR ) 100 MG tablet Take 1 tablet (100 mg total) by mouth 2 (two) times daily. 60 tablet 2   Multiple Vitamin (MULTIVITAMIN WITH MINERALS) TABS tablet Take 1 tablet by mouth daily. 30 tablet 3   sevelamer carbonate (RENVELA) 800 MG tablet Take 1,600 mg by mouth 3 (three) times daily.     trospium  (SANCTURA ) 20 MG tablet Take 1 tablet (20 mg total) by mouth 2 (two) times daily. 90 tablet 3   No current facility-administered medications on file prior to visit.    There are no Patient Instructions on file for this visit. No follow-ups on file.   Azuree Minish E Corena Tilson, NP

## 2024-09-03 NOTE — ED Notes (Signed)
 Secretary called and informed of code stroke

## 2024-09-04 ENCOUNTER — Telehealth (INDEPENDENT_AMBULATORY_CARE_PROVIDER_SITE_OTHER): Payer: Self-pay

## 2024-09-04 NOTE — Telephone Encounter (Signed)
 Spoke with the patient's spouse and she is scheduled with Dr. Marea for a left arm fistulagram on 09/17/24 with a 12:30 pm arrival time to the [facility]. Pre-procedure instructions were discussed and will be sent to Mychart and mailed.

## 2024-09-17 ENCOUNTER — Encounter
Admission: RE | Disposition: A | Discharge status: Home / Self Care | Payer: Self-pay | Source: Home / Self Care | Attending: Vascular Surgery

## 2024-09-17 ENCOUNTER — Other Ambulatory Visit: Payer: Self-pay

## 2024-09-17 ENCOUNTER — Encounter: Payer: Self-pay | Admitting: Vascular Surgery

## 2024-09-17 ENCOUNTER — Ambulatory Visit
Admission: RE | Admit: 2024-09-17 | Discharge: 2024-09-17 | Disposition: A | Discharge status: Home / Self Care | Attending: Vascular Surgery | Admitting: Vascular Surgery

## 2024-09-17 DIAGNOSIS — E1122 Type 2 diabetes mellitus with diabetic chronic kidney disease: Secondary | ICD-10-CM | POA: Insufficient documentation

## 2024-09-17 DIAGNOSIS — I120 Hypertensive chronic kidney disease with stage 5 chronic kidney disease or end stage renal disease: Secondary | ICD-10-CM | POA: Diagnosis not present

## 2024-09-17 DIAGNOSIS — N186 End stage renal disease: Secondary | ICD-10-CM | POA: Diagnosis present

## 2024-09-17 DIAGNOSIS — Z992 Dependence on renal dialysis: Secondary | ICD-10-CM | POA: Diagnosis not present

## 2024-09-17 DIAGNOSIS — T82858A Stenosis of vascular prosthetic devices, implants and grafts, initial encounter: Secondary | ICD-10-CM | POA: Diagnosis not present

## 2024-09-17 DIAGNOSIS — Z794 Long term (current) use of insulin: Secondary | ICD-10-CM | POA: Insufficient documentation

## 2024-09-17 DIAGNOSIS — Z7985 Long-term (current) use of injectable non-insulin antidiabetic drugs: Secondary | ICD-10-CM | POA: Diagnosis not present

## 2024-09-17 DIAGNOSIS — Z87891 Personal history of nicotine dependence: Secondary | ICD-10-CM | POA: Diagnosis not present

## 2024-09-17 DIAGNOSIS — Y832 Surgical operation with anastomosis, bypass or graft as the cause of abnormal reaction of the patient, or of later complication, without mention of misadventure at the time of the procedure: Secondary | ICD-10-CM | POA: Insufficient documentation

## 2024-09-17 HISTORY — PX: A/V FISTULAGRAM: CATH118298

## 2024-09-17 LAB — POTASSIUM: Potassium: 5.9 mmol/L — ABNORMAL HIGH (ref 3.5–5.1)

## 2024-09-17 LAB — POTASSIUM (ARMC VASCULAR LAB ONLY): Potassium (ARMC vascular lab): 6.2 mmol/L — ABNORMAL HIGH (ref 3.5–5.1)

## 2024-09-17 LAB — GLUCOSE, CAPILLARY
Glucose-Capillary: 121 mg/dL — ABNORMAL HIGH (ref 70–99)
Glucose-Capillary: 131 mg/dL — ABNORMAL HIGH (ref 70–99)

## 2024-09-17 SURGERY — A/V FISTULAGRAM
Anesthesia: Moderate Sedation | Laterality: Left

## 2024-09-17 MED ORDER — FAMOTIDINE 20 MG PO TABS
ORAL_TABLET | ORAL | Status: AC
  Filled 2024-09-17: qty 2

## 2024-09-17 MED ORDER — HEPARIN (PORCINE) IN NACL 1000-0.9 UT/500ML-% IV SOLN
INTRAVENOUS | Status: DC | PRN
  Administered 2024-09-17: 500 mL

## 2024-09-17 MED ORDER — DIPHENHYDRAMINE HCL 50 MG/ML IJ SOLN
INTRAMUSCULAR | Status: AC
  Filled 2024-09-17: qty 1

## 2024-09-17 MED ORDER — CEFAZOLIN SODIUM-DEXTROSE 1-4 GM/50ML-% IV SOLN
1.0000 g | INTRAVENOUS | Status: AC
  Administered 2024-09-17: 1 g via INTRAVENOUS

## 2024-09-17 MED ORDER — METHYLPREDNISOLONE SODIUM SUCC 125 MG IJ SOLR
125.0000 mg | Freq: Once | INTRAMUSCULAR | Status: AC | PRN
  Administered 2024-09-17: 125 mg via INTRAVENOUS

## 2024-09-17 MED ORDER — DIPHENHYDRAMINE HCL 50 MG/ML IJ SOLN
50.0000 mg | Freq: Once | INTRAMUSCULAR | Status: AC | PRN
  Administered 2024-09-17: 50 mg via INTRAVENOUS

## 2024-09-17 MED ORDER — MIDAZOLAM HCL 2 MG/2ML IJ SOLN
INTRAMUSCULAR | Status: AC
  Filled 2024-09-17: qty 2

## 2024-09-17 MED ORDER — METHYLPREDNISOLONE SODIUM SUCC 125 MG IJ SOLR
INTRAMUSCULAR | Status: AC
  Filled 2024-09-17: qty 2

## 2024-09-17 MED ORDER — SODIUM CHLORIDE 0.9 % IV SOLN: INTRAVENOUS | Status: DC

## 2024-09-17 MED ORDER — FENTANYL CITRATE (PF) 50 MCG/ML IJ SOSY
PREFILLED_SYRINGE | INTRAMUSCULAR | Status: AC
  Filled 2024-09-17: qty 1

## 2024-09-17 MED ORDER — FENTANYL CITRATE (PF) 100 MCG/2ML IJ SOLN
INTRAMUSCULAR | Status: DC | PRN
  Administered 2024-09-17: 50 ug via INTRAVENOUS

## 2024-09-17 MED ORDER — MIDAZOLAM HCL 2 MG/ML PO SYRP: 8.0000 mg | ORAL_SOLUTION | Freq: Once | ORAL | Status: DC | PRN

## 2024-09-17 MED ORDER — HEPARIN SODIUM (PORCINE) 1000 UNIT/ML IJ SOLN
INTRAMUSCULAR | Status: DC | PRN
  Administered 2024-09-17: 3000 [IU] via INTRAVENOUS

## 2024-09-17 MED ORDER — FAMOTIDINE 20 MG PO TABS
40.0000 mg | ORAL_TABLET | Freq: Once | ORAL | Status: AC | PRN
  Administered 2024-09-17: 40 mg via ORAL

## 2024-09-17 MED ORDER — MIDAZOLAM HCL (PF) 2 MG/2ML IJ SOLN
INTRAMUSCULAR | Status: DC | PRN
  Administered 2024-09-17: 2 mg via INTRAVENOUS

## 2024-09-17 MED ORDER — LIDOCAINE-EPINEPHRINE (PF) 1 %-1:200000 IJ SOLN
INTRAMUSCULAR | Status: DC | PRN
  Administered 2024-09-17: 10 mL

## 2024-09-17 MED ORDER — HEPARIN SODIUM (PORCINE) 1000 UNIT/ML IJ SOLN
INTRAMUSCULAR | Status: AC
  Filled 2024-09-17: qty 10

## 2024-09-17 MED ORDER — IODIXANOL 320 MG/ML IV SOLN
INTRAVENOUS | Status: DC | PRN
  Administered 2024-09-17: 20 mL

## 2024-09-17 SURGICAL SUPPLY — 15 items
BALLOON LUTONIX 6X150X130 (BALLOONS) IMPLANT
BALLOON LUTONIX DCB 7X60X130 (BALLOONS) IMPLANT
COVER PROBE ULTRASOUND 5X96 (MISCELLANEOUS) IMPLANT
DEVICE PRESTO INFLATION (MISCELLANEOUS) IMPLANT
DRAPE BRACHIAL (DRAPES) IMPLANT
KIT MICROPUNCTURE VSI 5F STIFF (SHEATH) IMPLANT
NDL ENTRY 21GA 7CM ECHOTIP (NEEDLE) IMPLANT
NEEDLE ENTRY 21GA 7CM ECHOTIP (NEEDLE) IMPLANT
PACK ANGIOGRAPHY (CUSTOM PROCEDURE TRAY) ×1 IMPLANT
SHEATH BRITE TIP 6FRX5.5 (SHEATH) IMPLANT
SHEATH BRITE TIP 7FRX5.5 (SHEATH) IMPLANT
STENT VIABAHN 8X50X120 (Permanent Stent) IMPLANT
SUT MNCRL AB 4-0 PS2 18 (SUTURE) IMPLANT
WIRE G V18X300CM (WIRE) IMPLANT
WIRE SUPRACORE 190CM (WIRE) IMPLANT

## 2024-09-17 NOTE — Progress Notes (Signed)
 Potassium 6.2, lab called to verify. Provider notified.

## 2024-09-17 NOTE — Interval H&P Note (Signed)
 History and Physical Interval Note:  09/17/2024 12:46 PM  Heather Lopez  has presented today for surgery, with the diagnosis of L Arm Fistulagram   End Stage Renal.  The various methods of treatment have been discussed with the patient and family. After consideration of risks, benefits and other options for treatment, the patient has consented to  Procedure(s): A/V Fistulagram (Left) as a surgical intervention.  The patient's history has been reviewed, patient examined, no change in status, stable for surgery.  I have reviewed the patient's chart and labs.  Questions were answered to the patient's satisfaction.     [name]

## 2024-09-17 NOTE — Op Note (Signed)
 [facility]    OPERATIVE NOTE   PROCEDURE: 1.   Left brachiocephalic arteriovenous fistula cannulation under ultrasound guidance 2.   Left arm fistulagram including central venogram 3.   Percutaneous transluminal angioplasty of left upper arm cephalic vein for 3 areas of stenosis using a 6 mm diameter by 15 cm length Lutonix drug-coated angioplasty balloon 4.   Stent placement using an 8 mm diameter by 5 cm length Viabahn stent for residual stenosis and was proximal lesion just proximal to the previously placed stent  PRE-OPERATIVE DIAGNOSIS: 1. ESRD 2. Poorly functional left brachiocephalic AVF  POST-OPERATIVE DIAGNOSIS: same as above   SURGEON: Selinda Gu, MD  ANESTHESIA: local with MCS  ESTIMATED BLOOD LOSS: 5 cc  FINDING(S): Left brachiocephalic AV fistula with 2 areas of greater than 75% stenosis between the anastomosis and the previously placed stent in the mid to distal upper arm cephalic vein.  There is a third area of stenosis just proximal to this previous stent that was greater than 85%.  The remainder of the upper arm cephalic vein and central venous circulation were widely patent  SPECIMEN(S):  None  CONTRAST: 20 cc  FLUORO TIME: 2.4 minutes  MODERATE CONSCIOUS SEDATION TIME: Approximately 28 minutes with 2 mg of Versed  and 50 mcg of Fentanyl    INDICATIONS: Heather Lopez is a 78 y.o. female who presents with malfunctioning left brachiocephalic arteriovenous fistula.  The patient is scheduled for left arm fistulagram.  The patient is aware the risks include but are not limited to: bleeding, infection, thrombosis of the cannulated access, and possible anaphylactic reaction to the contrast.  The patient is aware of the risks of the procedure and elects to proceed forward.  DESCRIPTION: After full informed written consent was obtained, the patient was brought back to the angiography suite and placed supine upon the angiography table.  The patient was  connected to monitoring equipment. Moderate conscious sedation was administered with a face to face encounter with the patient throughout the procedure with my supervision of the RN administering medicines and monitoring the patient's vital signs and mental status throughout from the start of the procedure until the patient was taken to the recovery room. The left arm was prepped and draped in the standard fashion for a percutaneous access intervention.  Under ultrasound guidance, the perianastomotic portion of the left brachiocephalic artery via arteriovenous fistula was cannulated with a micropuncture needle under direct ultrasound guidance where it was patent and a permanent image was performed.  The microwire was advanced into the fistula and the needle was exchanged for the a microsheath.  I then upsized to a 6 Fr Sheath and imaging was performed.  Hand injections were completed to image the access including the central venous system. This demonstrated a left brachiocephalic AV fistula with 2 areas of greater than 75% stenosis between the anastomosis and the previously placed stent in the mid to distal upper arm cephalic vein.  There is a third area of stenosis just proximal to this previous stent that was greater than 85%.  The remainder of the upper arm cephalic vein and central venous circulation were widely patent.  Based on the images, this patient will need interventional to [place]/stenosis. I then gave the patient 3000 units of intravenous heparin .  I then crossed the stenoses with a Supracore wire.  Based on the imaging, a 6 mm x 15 cm Lutonix drug-coated angioplasty balloon was selected and we address all stenoses.  The balloon was centered  around the 3 stenosis and inflated to 14 ATM for 1 minute(s).  On completion imaging, a less than 20% residual stenosis was present and the 2 stenoses between the anastomosis and the stent, but a greater than 60% residual stenosis is present in the mid upper  arm cephalic vein just proximal to the placed I then exchanged for a 7 French sheath and 0.018 wire.  An 8 mm diameter by 5 cm length Viabahn stent was selected and deployed in the mid upper arm cephalic vein with a slight bridging of the previously placed stent and then postdilated with a 7 mm balloon with excellent angiographic completion result and less than 10% residual stenosis.     Based on the completion imaging, no further intervention is necessary.  The wire and balloon were removed from the sheath.  A 4-0 Monocryl purse-string suture was sewn around the sheath.  The sheath was removed while tying down the suture.  A sterile bandage was applied to the puncture site.  COMPLICATIONS: None  CONDITION: Stable   Selinda Gu  09/17/2024 2:50 PM   This note was created with Dragon Medical transcription system. Any errors in dictation are purely unintentional.

## 2024-09-18 ENCOUNTER — Encounter: Payer: Self-pay | Admitting: Vascular Surgery

## 2024-09-28 ENCOUNTER — Encounter: Payer: Self-pay | Admitting: Emergency Medicine

## 2024-09-28 ENCOUNTER — Observation Stay

## 2024-09-28 ENCOUNTER — Observation Stay
Admission: EM | Admit: 2024-09-28 | Discharge: 2024-09-30 | Disposition: A | Discharge status: Home / Self Care | Source: Ambulatory Visit | Attending: Student | Admitting: Student

## 2024-09-28 ENCOUNTER — Other Ambulatory Visit: Payer: Self-pay

## 2024-09-28 DIAGNOSIS — Z87891 Personal history of nicotine dependence: Secondary | ICD-10-CM | POA: Insufficient documentation

## 2024-09-28 DIAGNOSIS — E1122 Type 2 diabetes mellitus with diabetic chronic kidney disease: Secondary | ICD-10-CM | POA: Insufficient documentation

## 2024-09-28 DIAGNOSIS — R6520 Severe sepsis without septic shock: Secondary | ICD-10-CM | POA: Insufficient documentation

## 2024-09-28 DIAGNOSIS — Z992 Dependence on renal dialysis: Secondary | ICD-10-CM | POA: Diagnosis not present

## 2024-09-28 DIAGNOSIS — Z79899 Other long term (current) drug therapy: Secondary | ICD-10-CM | POA: Insufficient documentation

## 2024-09-28 DIAGNOSIS — Z6838 Body mass index (BMI) 38.0-38.9, adult: Secondary | ICD-10-CM | POA: Diagnosis not present

## 2024-09-28 DIAGNOSIS — I120 Hypertensive chronic kidney disease with stage 5 chronic kidney disease or end stage renal disease: Secondary | ICD-10-CM | POA: Diagnosis not present

## 2024-09-28 DIAGNOSIS — K529 Noninfective gastroenteritis and colitis, unspecified: Secondary | ICD-10-CM | POA: Insufficient documentation

## 2024-09-28 DIAGNOSIS — Z7982 Long term (current) use of aspirin: Secondary | ICD-10-CM | POA: Diagnosis not present

## 2024-09-28 DIAGNOSIS — N390 Urinary tract infection, site not specified: Principal | ICD-10-CM | POA: Diagnosis present

## 2024-09-28 DIAGNOSIS — N186 End stage renal disease: Secondary | ICD-10-CM | POA: Diagnosis not present

## 2024-09-28 DIAGNOSIS — R300 Dysuria: Secondary | ICD-10-CM | POA: Diagnosis present

## 2024-09-28 DIAGNOSIS — A419 Sepsis, unspecified organism: Secondary | ICD-10-CM | POA: Diagnosis present

## 2024-09-28 DIAGNOSIS — Z7901 Long term (current) use of anticoagulants: Secondary | ICD-10-CM | POA: Insufficient documentation

## 2024-09-28 DIAGNOSIS — E66812 Obesity, class 2: Secondary | ICD-10-CM | POA: Diagnosis not present

## 2024-09-28 DIAGNOSIS — N10 Acute tubulo-interstitial nephritis: Principal | ICD-10-CM | POA: Insufficient documentation

## 2024-09-28 LAB — COMPREHENSIVE METABOLIC PANEL WITH GFR
ALT: 20 U/L (ref 0–44)
AST: 28 U/L (ref 15–41)
Albumin: 3 g/dL — ABNORMAL LOW (ref 3.5–5.0)
Alkaline Phosphatase: 112 U/L (ref 38–126)
Anion gap: 17 — ABNORMAL HIGH (ref 5–15)
BUN: 67 mg/dL — ABNORMAL HIGH (ref 8–23)
CO2: 22 mmol/L (ref 22–32)
Calcium: 8.6 mg/dL — ABNORMAL LOW (ref 8.9–10.3)
Chloride: 91 mmol/L — ABNORMAL LOW (ref 98–111)
Creatinine, Ser: 8.56 mg/dL — ABNORMAL HIGH (ref 0.44–1.00)
GFR, Estimated: 4 mL/min — ABNORMAL LOW (ref >60)
Glucose, Bld: 203 mg/dL — ABNORMAL HIGH (ref 70–99)
Potassium: 4.8 mmol/L (ref 3.5–5.1)
Sodium: 130 mmol/L — ABNORMAL LOW (ref 135–145)
Total Bilirubin: 0.5 mg/dL (ref 0.0–1.2)
Total Protein: 7 g/dL (ref 6.5–8.1)

## 2024-09-28 LAB — CBC WITH DIFFERENTIAL/PLATELET
Abs Immature Granulocytes: 0.09 K/uL — ABNORMAL HIGH (ref 0.00–0.07)
Basophils Absolute: 0.1 K/uL (ref 0.0–0.1)
Basophils Relative: 0 %
Eosinophils Absolute: 0.1 K/uL (ref 0.0–0.5)
Eosinophils Relative: 1 %
HCT: 30 % — ABNORMAL LOW (ref 36.0–46.0)
Hemoglobin: 10.1 g/dL — ABNORMAL LOW (ref 12.0–15.0)
Immature Granulocytes: 1 %
Lymphocytes Relative: 13 %
Lymphs Abs: 1.9 K/uL (ref 0.7–4.0)
MCH: 34.7 pg — ABNORMAL HIGH (ref 26.0–34.0)
MCHC: 33.7 g/dL (ref 30.0–36.0)
MCV: 103.1 fL — ABNORMAL HIGH (ref 80.0–100.0)
Monocytes Absolute: 0.9 K/uL (ref 0.1–1.0)
Monocytes Relative: 6 %
Neutro Abs: 11.7 K/uL — ABNORMAL HIGH (ref 1.7–7.7)
Neutrophils Relative %: 79 %
Platelets: 233 K/uL (ref 150–400)
RBC: 2.91 MIL/uL — ABNORMAL LOW (ref 3.87–5.11)
RDW: 15.9 % — ABNORMAL HIGH (ref 11.5–15.5)
WBC: 14.8 K/uL — ABNORMAL HIGH (ref 4.0–10.5)
nRBC: 0 % (ref 0.0–0.2)

## 2024-09-28 LAB — HEMOGLOBIN A1C
Hgb A1c MFr Bld: 8 % — ABNORMAL HIGH (ref 4.8–5.6)
Mean Plasma Glucose: 182.9 mg/dL

## 2024-09-28 LAB — URINALYSIS, W/ REFLEX TO CULTURE (INFECTION SUSPECTED)
Bilirubin Urine: NEGATIVE
Glucose, UA: NEGATIVE mg/dL
Ketones, ur: NEGATIVE mg/dL
Nitrite: NEGATIVE
Protein, ur: 100 mg/dL — AB
RBC / HPF: 0 RBC/hpf (ref 0–5)
Specific Gravity, Urine: 1.01 (ref 1.005–1.030)
Squamous Epithelial / HPF: 0 /HPF (ref 0–5)
WBC, UA: >50 WBC/hpf (ref 0–5)
pH: 7 (ref 5.0–8.0)

## 2024-09-28 LAB — LACTIC ACID, PLASMA
Lactic Acid, Venous: 2.5 mmol/L (ref 0.5–1.9)
Lactic Acid, Venous: 2 mmol/L (ref 0.5–1.9)

## 2024-09-28 LAB — GLUCOSE, CAPILLARY
Glucose-Capillary: 158 mg/dL — ABNORMAL HIGH (ref 70–99)
Glucose-Capillary: 182 mg/dL — ABNORMAL HIGH (ref 70–99)

## 2024-09-28 MED ORDER — SEVELAMER CARBONATE 800 MG PO TABS
1600.0000 mg | ORAL_TABLET | Freq: Three times a day (TID) | ORAL | Status: DC
  Administered 2024-09-28 – 2024-09-30 (×5): 1600 mg via ORAL
  Filled 2024-09-28 (×5): qty 2

## 2024-09-28 MED ORDER — ONDANSETRON HCL 4 MG/2ML IJ SOLN: 4.0000 mg | Freq: Four times a day (QID) | INTRAMUSCULAR | Status: DC | PRN

## 2024-09-28 MED ORDER — ATORVASTATIN CALCIUM 20 MG PO TABS
20.0000 mg | ORAL_TABLET | Freq: Every day | ORAL | Status: DC
  Administered 2024-09-29 – 2024-09-30 (×2): 20 mg via ORAL
  Filled 2024-09-28 (×2): qty 1

## 2024-09-28 MED ORDER — ACETAMINOPHEN 325 MG PO TABS: 650.0000 mg | ORAL_TABLET | Freq: Four times a day (QID) | ORAL | Status: DC | PRN

## 2024-09-28 MED ORDER — FESOTERODINE FUMARATE ER 4 MG PO TB24
4.0000 mg | ORAL_TABLET | Freq: Every day | ORAL | Status: DC
  Administered 2024-09-29 – 2024-09-30 (×2): 4 mg via ORAL
  Filled 2024-09-28 (×2): qty 1

## 2024-09-28 MED ORDER — INSULIN ASPART 100 UNIT/ML IJ SOLN
0.0000 [IU] | Freq: Three times a day (TID) | INTRAMUSCULAR | Status: DC
  Administered 2024-09-28: 1 [IU] via SUBCUTANEOUS
  Administered 2024-09-29: 3 [IU] via SUBCUTANEOUS
  Administered 2024-09-29: 1 [IU] via SUBCUTANEOUS
  Administered 2024-09-30 (×2): 2 [IU] via SUBCUTANEOUS
  Filled 2024-09-28 (×5): qty 1

## 2024-09-28 MED ORDER — SODIUM CHLORIDE 0.9 % IV SOLN
2.0000 g | Freq: Once | INTRAVENOUS | Status: AC
  Administered 2024-09-28: 2 g via INTRAVENOUS
  Filled 2024-09-28: qty 20

## 2024-09-28 MED ORDER — ONDANSETRON HCL 4 MG PO TABS: 4.0000 mg | ORAL_TABLET | Freq: Four times a day (QID) | ORAL | Status: DC | PRN

## 2024-09-28 MED ORDER — HYDRALAZINE HCL 20 MG/ML IJ SOLN: 5.0000 mg | Freq: Four times a day (QID) | INTRAMUSCULAR | Status: DC | PRN

## 2024-09-28 MED ORDER — LOPERAMIDE HCL 2 MG PO CAPS: 2.0000 mg | ORAL_CAPSULE | Freq: Four times a day (QID) | ORAL | Status: DC | PRN

## 2024-09-28 MED ORDER — BUPROPION HCL ER (XL) 150 MG PO TB24
150.0000 mg | ORAL_TABLET | Freq: Every day | ORAL | Status: DC
  Administered 2024-09-29 – 2024-09-30 (×2): 150 mg via ORAL
  Filled 2024-09-28 (×2): qty 1

## 2024-09-28 MED ORDER — HEPARIN SODIUM (PORCINE) 5000 UNIT/ML IJ SOLN
5000.0000 [IU] | Freq: Two times a day (BID) | INTRAMUSCULAR | Status: DC
  Administered 2024-09-28 – 2024-09-30 (×4): 5000 [IU] via SUBCUTANEOUS
  Filled 2024-09-28 (×4): qty 1

## 2024-09-28 MED ORDER — METOPROLOL TARTRATE 25 MG PO TABS
25.0000 mg | ORAL_TABLET | Freq: Two times a day (BID) | ORAL | Status: DC
  Administered 2024-09-28 – 2024-09-30 (×3): 25 mg via ORAL
  Filled 2024-09-28 (×4): qty 1

## 2024-09-28 MED ORDER — BUSPIRONE HCL 10 MG PO TABS
5.0000 mg | ORAL_TABLET | Freq: Every day | ORAL | Status: DC
  Administered 2024-09-29 – 2024-09-30 (×2): 5 mg via ORAL
  Filled 2024-09-28 (×2): qty 1

## 2024-09-28 MED ORDER — ACETAMINOPHEN 650 MG RE SUPP: 650.0000 mg | Freq: Four times a day (QID) | RECTAL | Status: DC | PRN

## 2024-09-28 MED ORDER — SENNOSIDES-DOCUSATE SODIUM 8.6-50 MG PO TABS
1.0000 | ORAL_TABLET | Freq: Every evening | ORAL | Status: DC | PRN
Start: 2024-09-28 — End: 2024-09-30

## 2024-09-28 MED ORDER — ASPIRIN 81 MG PO TBEC
81.0000 mg | DELAYED_RELEASE_TABLET | Freq: Every day | ORAL | Status: DC
  Administered 2024-09-29 – 2024-09-30 (×2): 81 mg via ORAL
  Filled 2024-09-28 (×2): qty 1

## 2024-09-28 MED ORDER — CEFTRIAXONE SODIUM 2 G IJ SOLR
2.0000 g | INTRAMUSCULAR | Status: DC
  Administered 2024-09-29: 2 g via INTRAVENOUS
  Filled 2024-09-28: qty 20

## 2024-09-28 MED ORDER — SODIUM CHLORIDE 0.9 % IV BOLUS
250.0000 mL | Freq: Once | INTRAVENOUS | Status: AC
Start: 2024-09-28 — End: 2024-09-28
  Administered 2024-09-28: 250 mL via INTRAVENOUS

## 2024-09-28 NOTE — ED Provider Notes (Signed)
 [facility] Provider Note    Event Date/Time   First MD Initiated Contact with Patient 09/28/24 1202     (approximate)   History   Back Pain and Dysuria  Pt in via POV, sent over from [facility] reports some dysuria over the weekend which has now developed into some lower back pain.  Husband reports some confusion last night which appears better this morning.  KC concerned for hypotension, 90/50.  Patient reports low BP at baseline; current reading 99/50.  Patient does appear fatigued, NAD noted at his time.     HPI Heather Lopez is a 78 y.o. female PMH multiple medical comorbidities including T2DM, ESRD on HD, hyperlipidemia, IBS, hypertension, duodenal ulcer perforation, atrial flutter, prior subdural hematoma presents for evaluation of dysuria, low back pain, weakness - Patient been having about 2-3 days of dysuria, suprapubic discomfort and about 1 day of low back pain.  Was apparently somewhat confused yesterday evening though has largely returned to baseline today.  No preceding trauma.  No fevers.  No vomiting.  No frank abdominal pain. - Does have a history of prior urolithiasis, states this feels different - Goes to dialysis Tuesday, Thursday, Saturday-has not missed any sessions - Does make small amounts of urine and says this feels similar to prior UTIs       Physical Exam   Triage Vital Signs: ED Triage Vitals  Encounter Vitals Group     BP 09/28/24 1154 (!) 99/50     Girls Systolic BP Percentile --      Girls Diastolic BP Percentile --      Boys Systolic BP Percentile --      Boys Diastolic BP Percentile --      Pulse Rate 09/28/24 1154 76     Resp 09/28/24 1154 17     Temp 09/28/24 1154 98.2 F (36.8 C)     Temp Source 09/28/24 1154 Oral     SpO2 09/28/24 1154 96 %     Weight 09/28/24 1155 220 lb (99.8 kg)     Height 09/28/24 1155 5' 3 (1.6 m)     Head Circumference --      Peak Flow --      Pain Score 09/28/24 1155 5     Pain Loc  --      Pain Education --      Exclude from Growth Chart --     Most recent vital signs: Vitals:   09/28/24 1433 09/28/24 1500  BP: (!) 140/53 (!) 137/52  Pulse: 73 74  Resp: 18 19  Temp:    SpO2: 100% 97%     General: Awake, no distress.  CV:  Good peripheral perfusion. RRR, RP 2+ Resp:  Normal effort. CTAB Abd:  No distention. Nontender to deep palpation throughout except for mild tenderness in suprapubic region only    ED Results / Procedures / Treatments   Labs (all labs ordered are listed, but only abnormal results are displayed) Labs Reviewed  LACTIC ACID, PLASMA - Abnormal; Notable for the following components:      Result Value   Lactic Acid, Venous 2.5 (*)    All other components within normal limits  LACTIC ACID, PLASMA - Abnormal; Notable for the following components:   Lactic Acid, Venous 2.0 (*)    All other components within normal limits  CBC WITH DIFFERENTIAL/PLATELET - Abnormal; Notable for the following components:   WBC 14.8 (*)    RBC 2.91 (*)  Hemoglobin 10.1 (*)    HCT 30.0 (*)    MCV 103.1 (*)    MCH 34.7 (*)    RDW 15.9 (*)    Neutro Abs 11.7 (*)    Abs Immature Granulocytes 0.09 (*)    All other components within normal limits  URINALYSIS, W/ REFLEX TO CULTURE (INFECTION SUSPECTED) - Abnormal; Notable for the following components:   Color, Urine YELLOW (*)    APPearance TURBID (*)    Hgb urine dipstick SMALL (*)    Protein, ur 100 (*)    Leukocytes,Ua MODERATE (*)    Bacteria, UA FEW (*)    All other components within normal limits  COMPREHENSIVE METABOLIC PANEL WITH GFR - Abnormal; Notable for the following components:   Sodium 130 (*)    Chloride 91 (*)    Glucose, Bld 203 (*)    BUN 67 (*)    Creatinine, Ser 8.56 (*)    Calcium  8.6 (*)    Albumin  3.0 (*)    GFR, Estimated 4 (*)    Anion gap 17 (*)    All other components within normal limits  CULTURE, BLOOD (ROUTINE X 2)  CULTURE, BLOOD (ROUTINE X 2)  URINE CULTURE   HEMOGLOBIN A1C     EKG  N/a   RADIOLOGY N/a    PROCEDURES:  Critical Care performed: Yes, see critical care procedure note(s)  .Critical Care  Performed by: Clarine Ozell LABOR, MD Authorized by: Clarine Ozell LABOR, MD   Critical care provider statement:    Critical care time (minutes):  30   Critical care time was exclusive of:  Separately billable procedures and treating other patients   Critical care was necessary to treat or prevent imminent or life-threatening deterioration of the following conditions:  Sepsis   Critical care was time spent personally by me on the following activities:  Development of treatment plan with patient or surrogate, discussions with consultants, evaluation of patient's response to treatment, examination of patient, ordering and review of laboratory studies, ordering and review of radiographic studies, ordering and performing treatments and interventions, pulse oximetry, re-evaluation of patient's condition and review of old charts   I assumed direction of critical care for this patient from another provider in my specialty: no     Care discussed with: admitting provider      MEDICATIONS ORDERED IN ED: Medications  aspirin  EC tablet 81 mg (has no administration in time range)  atorvastatin  (LIPITOR) tablet 20 mg (has no administration in time range)  metoprolol  tartrate (LOPRESSOR ) tablet 25 mg (has no administration in time range)  buPROPion  (WELLBUTRIN  XL) 24 hr tablet 150 mg (has no administration in time range)  busPIRone  (BUSPAR ) tablet 5 mg (has no administration in time range)  loperamide  (IMODIUM ) capsule 2 mg (has no administration in time range)  sevelamer carbonate (RENVELA) tablet 1,600 mg (has no administration in time range)  fesoterodine  (TOVIAZ ) tablet 4 mg (has no administration in time range)  heparin  injection 5,000 Units (has no administration in time range)  acetaminophen  (TYLENOL ) tablet 650 mg (has no administration in time  range)    Or  acetaminophen  (TYLENOL ) suppository 650 mg (has no administration in time range)  ondansetron  (ZOFRAN ) tablet 4 mg (has no administration in time range)    Or  ondansetron  (ZOFRAN ) injection 4 mg (has no administration in time range)  senna-docusate (Senokot-S) tablet 1 tablet (has no administration in time range)  insulin  aspart (novoLOG ) injection 0-6 Units (has no administration in time range)  cefTRIAXone  (ROCEPHIN ) 2 g in sodium chloride  0.9 % 100 mL IVPB (has no administration in time range)  hydrALAZINE  (APRESOLINE ) injection 5 mg (has no administration in time range)  sodium chloride  0.9 % bolus 250 mL (0 mLs Intravenous Stopped 09/28/24 1317)  cefTRIAXone  (ROCEPHIN ) 2 g in sodium chloride  0.9 % 100 mL IVPB (0 g Intravenous Stopped 09/28/24 1453)     IMPRESSION / MDM / ASSESSMENT AND PLAN / ED COURSE  I reviewed the triage vital signs and the nursing notes.                              DDX/MDM/AP: Differential diagnosis includes, but is not limited to, UTI/pyelonephritis, doubt acute intra-abdominal pathology at this time, doubt urolithiasis at this time.  Somewhat hypotensive, consider underlying dehydration, will give small bolus IV fluid.  Plan: - Labs - Patient amenable to straight cath - Small bolus IV fluid - Denies need for any pain medications  Patient's presentation is most consistent with acute presentation with potential threat to life or bodily function.  The patient is on the cardiac monitor to evaluate for evidence of arrhythmia and/or significant heart rate changes.  ED course below.  Labs with new leukocytosis to about 15 as well as frank evidence of UTI.  Last urine culture Klebsiella sensitive to ceftriaxone --treating with ceftriaxone .  Blood pressure normalized after modified reduced fluid bolus of 250 cc IV fluid given history of ESRD on dialysis.  Some concern for sepsis given elevated lactate and relative hypotension on arrival, resolved with  above IV fluid.  Admitted to hospitalist service.  Clinical Course as of 09/28/24 1630  Mon Sep 28, 2024  1327 CBC with new leukocytosis 14.8 [MM]  1327 Lactate 2.5 [MM]  1344 UA c/w infxn [MM]  1347 Updated on findings, amenable to admission  Treatment ceftriaxone   Hospitalist consult order placed [MM]    Clinical Course User Index [MM] Clarine Ozell LABOR, MD     FINAL CLINICAL IMPRESSION(S) / ED DIAGNOSES   Final diagnoses:  Acute pyelonephritis  Sepsis without acute organ dysfunction, due to unspecified organism [facility])     Rx / DC Orders   ED Discharge Orders     None        Note:  This document was prepared using Dragon voice recognition software and may include unintentional dictation errors.   Clarine Ozell LABOR, MD 09/28/24 1630

## 2024-09-28 NOTE — H&P (Signed)
 History and Physical    Heather Lopez FMW:[id number] DOB: 04/15/1946 DOA: 09/28/2024  PCP: Franchot Houston, PA-C (Confirm with patient/family/NH records and if not entered, this has to be entered at [facility] point of entry) Patient coming from: Home  I have personally briefly reviewed patient's old medical records in [facility]  Chief Complaint: Dysuria, back pain  HPI: Heather Lopez is a 78 y.o. female with medical history significant of ESRD only on HD TTS, HTN, HLD, IDDM, obesity, anxiety/depression, presented with worsening of lower abdominal pain dysuria and back pain.  Symptoms started 2 days ago, patient started to have dysuria with burning sensation when during urination, as well as lower abdominal pain, associated with new onset of back pain but denied any nauseous vomiting fever or chills.  She has been feeling weak since yesterday, decreased oral intake but denied any nauseous vomiting.  She denies any cough no shortness of breath. ED Course: Temperature 98.2 blood pressure 99/50 improved after small IV bolus 12/28/2023/57.  O2 saturation 99% on room air.  Chest x-ray negative for acute infiltrates, blood work showed lactic acid 2.5 hemoglobin 10.0 WBC 14.8 BUN 67 creatinine 8.8 bicarb 22K 4.8 glucose 203.  UA showed 3+ WBC, 0 RBC moderate leukocyte negative nitrite.  Patient was given a small IV bolus 250 mL in the ED and started on ceftriaxone .  Review of Systems: As per HPI otherwise 14 point review of systems negative.    Past Medical History:  Diagnosis Date   Acute renal failure 06/27/2021   Anemia in chronic kidney disease    Anxiety    Aortic atherosclerosis    Atrial fibrillation (HCC)    in 2022 while in hospital for sepsis   Cataract    Depression    Dysrhythmia    ESRD on dialysis (HCC)    M, W, F   GERD (gastroesophageal reflux disease)    Gout    History of kidney stones    Hydronephrosis of right kidney    Hyperlipidemia    Hypertension, essential    Nausea  & vomiting 12/02/2021   Obesity    Obstruction of left ureteropelvic junction (UPJ) due to stone 06/2021   Osteoarthritis    Perforated duodenal ulcer (HCC) 06/27/2021   Pyonephrosis 06/2021   left   Septic shock (HCC) 06/27/2021   Spontaneous bacterial peritonitis (HCC)    Subdural hematoma (HCC) 08/2023   Type 2 diabetes mellitus with stage 5 chronic kidney disease (HCC)    Vertigo     Past Surgical History:  Procedure Laterality Date   A/V FISTULAGRAM Left 04/23/2024   Procedure: A/V Fistulagram;  Surgeon: Marea Selinda RAMAN, MD;  Location: [facility];  Service: Cardiovascular;  Laterality: Left;   A/V FISTULAGRAM Left 09/17/2024   Procedure: A/V Fistulagram;  Surgeon: Marea Selinda RAMAN, MD;  Location: [facility];  Service: Cardiovascular;  Laterality: Left;   AV FISTULA PLACEMENT Left 01/09/2024   Procedure: ARTERIOVENOUS (AV) FISTULA CREATION (BRACHIAL CEPHALIC);  Surgeon: Marea Selinda RAMAN, MD;  Location: [facility];  Service: Vascular;  Laterality: Left;   CAPD REMOVAL N/A 08/02/2023   Procedure: CONTINUOUS AMBULATORY PERITONEAL DIALYSIS  (CAPD) CATHETER REMOVAL;  Surgeon: Jordis Laneta FALCON, MD;  Location: [facility];  Service: General;  Laterality: N/A;   CATARACT EXTRACTION, BILATERAL Bilateral 2019   COLONOSCOPY WITH PROPOFOL  N/A 08/19/2015   Procedure: COLONOSCOPY WITH PROPOFOL ;  Surgeon: Rogelia Copping, MD;  Location: [facility];  Service: Endoscopy;  Laterality: N/A;  diabetic - insulin    CYSTOSCOPY W/ RETROGRADES Bilateral 12/22/2019   Procedure: CYSTOSCOPY WITH RETROGRADE PYELOGRAM;  Surgeon: Twylla Glendia BROCKS, MD;  Location: [facility];  Service: Urology;  Laterality: Bilateral;   CYSTOSCOPY W/ URETERAL STENT PLACEMENT Bilateral 07/05/2021   Procedure: CYSTOSCOPY WITH RETROGRADE PYELOGRAM/BILATERAL URETERAL STENT PLACEMENT;  Surgeon: Twylla Glendia BROCKS, MD;  Location: [facility];  Service: Urology;  Laterality: Bilateral;   CYSTOSCOPY W/ URETERAL STENT PLACEMENT Bilateral  12/26/2021   Procedure: CYSTOSCOPY WITH STENT REMOVAL;  Surgeon: Twylla Glendia BROCKS, MD;  Location: [facility];  Service: Urology;  Laterality: Bilateral;   CYSTOSCOPY W/ URETERAL STENT PLACEMENT Right 03/13/2022   Procedure: CYSTOSCOPY WITH RETROGRADE PYELOGRAM/URETERAL STENT PLACEMENT;  Surgeon: Twylla Glendia BROCKS, MD;  Location: [facility];  Service: Urology;  Laterality: Right;   CYSTOSCOPY W/ URETERAL STENT PLACEMENT Right 01/01/2023   Procedure: CYSTOSCOPY WITH STENT EXCHANGE;  Surgeon: Twylla Glendia BROCKS, MD;  Location: [facility];  Service: Urology;  Laterality: Right;   CYSTOSCOPY W/ URETERAL STENT REMOVAL Right 05/08/2022   Procedure: CYSTOSCOPY WITH STENT REMOVAL;  Surgeon: Twylla Glendia BROCKS, MD;  Location: [facility];  Service: Urology;  Laterality: Right;   CYSTOSCOPY W/ URETERAL STENT REMOVAL Right 07/16/2024   Procedure: REMOVAL, STENT, URETER, CYSTOSCOPIC;  Surgeon: Twylla Glendia BROCKS, MD;  Location: [facility];  Service: Urology;  Laterality: Right;   CYSTOSCOPY WITH BIOPSY N/A 12/22/2019   Procedure: CYSTOSCOPY WITH bladder BIOPSY;  Surgeon: Twylla Glendia BROCKS, MD;  Location: [facility];  Service: Urology;  Laterality: N/A;   CYSTOSCOPY WITH BIOPSY  03/13/2022   Procedure: CYSTOSCOPY WITH BLADDER BIOPSY;  Surgeon: Twylla Glendia BROCKS, MD;  Location: [facility];  Service: Urology;;   CYSTOSCOPY WITH STENT PLACEMENT Right 12/22/2019   Procedure: CYSTOSCOPY WITH STENT PLACEMENT;  Surgeon: Twylla Glendia BROCKS, MD;  Location: [facility];  Service: Urology;  Laterality: Right;   CYSTOSCOPY WITH STENT PLACEMENT Right 07/03/2022   Procedure: CYSTOSCOPY/RIGHT URETEROSCOPY WITH STENT PLACEMENT;  Surgeon: Twylla Glendia BROCKS, MD;  Location: [facility];  Service: Urology;  Laterality: Right;   CYSTOSCOPY WITH URETEROSCOPY Bilateral 12/22/2019   Procedure: CYSTOSCOPY WITH URETEROSCOPY;  Surgeon: Twylla Glendia BROCKS, MD;  Location: [facility];  Service: Urology;  Laterality: Bilateral;   CYSTOSCOPY/URETEROSCOPY/HOLMIUM LASER/STENT  PLACEMENT Right 12/22/2019   Procedure: CYSTOSCOPY/URETEROSCOPY/HOLMIUM LASER/STENT PLACEMENT;  Surgeon: Twylla Glendia BROCKS, MD;  Location: [facility];  Service: Urology;  Laterality: Right;   CYSTOSCOPY/URETEROSCOPY/HOLMIUM LASER/STENT PLACEMENT Bilateral 12/08/2021   Procedure: CYSTOSCOPY/URETEROSCOPY/HOLMIUM LASER/STENT PLACEMENT;  Surgeon: Francisca Redell BROCKS, MD;  Location: [facility];  Service: Urology;  Laterality: Bilateral;   DIALYSIS/PERMA CATHETER INSERTION N/A 06/29/2021   Procedure: DIALYSIS/PERMA CATHETER INSERTION;  Surgeon: Marea Selinda RAMAN, MD;  Location: [facility];  Service: Cardiovascular;  Laterality: N/A;   DIALYSIS/PERMA CATHETER INSERTION N/A 07/13/2021   Procedure: DIALYSIS/PERMA CATHETER INSERTION;  Surgeon: Marea Selinda RAMAN, MD;  Location: [facility];  Service: Cardiovascular;  Laterality: N/A;   DIALYSIS/PERMA CATHETER INSERTION N/A 08/01/2023   Procedure: DIALYSIS/PERMA CATHETER INSERTION;  Surgeon: Marea Selinda RAMAN, MD;  Location: [facility];  Service: Cardiovascular;  Laterality: N/A;   DIALYSIS/PERMA CATHETER REMOVAL N/A 06/18/2024   Procedure: DIALYSIS/PERMA CATHETER REMOVAL;  Surgeon: Marea Selinda RAMAN, MD;  Location: [facility];  Service: Cardiovascular;  Laterality: N/A;   EYE SURGERY     INCISIONAL HERNIA REPAIR  06/28/2023   IR GASTROSTOMY TUBE MOD SED  08/01/2021   LAPAROTOMY N/A 06/29/2021   Procedure: EXPLORATORY LAPAROTOMY WITH REPAIR OF DUODENAL PERFORATION;  Surgeon: Desiderio Schanz, MD;  Location: [facility];  Service: General;  Laterality: N/A;   POLYPECTOMY  08/19/2015   Procedure: POLYPECTOMY;  Surgeon: Rogelia Copping, MD;  Location: [facility];  Service: Endoscopy;;   TUBAL LIGATION  1992     reports that she quit smoking about 52 years ago. Her smoking use included cigarettes. She started smoking about 58 years ago. She has a 4.5 pack-year smoking history. She has never used smokeless tobacco. She reports that she does not drink  alcohol and does not use drugs.  Allergies  Allergen Reactions   Contrast Media  [Iodinated Contrast Media] Anaphylaxis   Iodine  Swelling    (IV only) - angioedema    Family History  Problem Relation Age of Onset   Diabetes Father    Cancer Father        lung cancer   Diabetes Brother    Stroke Mother      Prior to Admission medications   Medication Sig Start Date End Date Taking? Authorizing Provider  aspirin  81 MG EC tablet Take 81 mg by mouth daily.    [provider]  atorvastatin  (LIPITOR) 20 MG tablet Take 20 mg by mouth at bedtime.    [provider]  buPROPion  (WELLBUTRIN  XL) 150 MG 24 hr tablet Take 150 mg by mouth every morning. 06/26/24   [provider]  busPIRone  (BUSPAR ) 5 MG tablet Take 5 mg by mouth every morning.    [provider]  cloNIDine  (CATAPRES ) 0.1 MG tablet Take 1 tablet (0.1 mg total) by mouth 2 (two) times daily. 12/11/21   Patel, Sona, MD  Dulaglutide  3 MG/0.5ML SOAJ Inject 3 mg into the skin once a week. Sundays 04/02/24   [provider]  Insulin  Aspart FlexPen (NOVOLOG ) 100 UNIT/ML Inject 14 Units into the skin 2 (two) times daily. 04/06/24   [provider]  insulin  glargine (LANTUS ) 100 UNIT/ML injection Inject 0.15 mLs (15 Units total) into the skin at bedtime. Patient taking differently: Inject 30 Units into the skin at bedtime. 09/22/23   Agbata, Tochukwu, MD  irbesartan (AVAPRO) 150 MG tablet Take 150 mg by mouth daily. 04/03/24   [provider]  loperamide  (IMODIUM  A-D) 2 MG tablet Take 2 mg by mouth every 6 (six) hours as needed for diarrhea or loose stools.    [provider]  metoprolol  tartrate (LOPRESSOR ) 100 MG tablet Take 1 tablet (100 mg total) by mouth 2 (two) times daily. 12/11/21   Patel, Sona, MD  Multiple Vitamin (MULTIVITAMIN WITH MINERALS) TABS tablet Take 1 tablet by mouth daily. 12/12/21   Patel, Sona, MD  sevelamer carbonate (RENVELA) 800 MG tablet Take 1,600 mg  by mouth 3 (three) times daily. 04/12/24   [provider]  trospium  (SANCTURA ) 20 MG tablet Take 1 tablet (20 mg total) by mouth 2 (two) times daily. 07/01/24   Helon Clotilda LABOR, PA-C    Physical Exam: Vitals:   09/28/24 1155 09/28/24 1312 09/28/24 1330 09/28/24 1433  BP:  121/72 (!) 127/57 (!) 140/53  Pulse:  76 70 73  Resp:  18 18 18   Temp:      TempSrc:      SpO2:  95% 98% 100%  Weight: 99.8 kg     Height: 5' 3 (1.6 m)       Constitutional: NAD, calm, comfortable Vitals:   09/28/24 1155 09/28/24 1312 09/28/24 1330 09/28/24 1433  BP:  121/72 (!) 127/57 (!) 140/53  Pulse:  76 70 73  Resp:  18 18 18   Temp:      TempSrc:      SpO2:  95% 98% 100%  Weight: 99.8 kg     Height: 5' 3 (1.6 m)      Eyes: PERRL, lids and conjunctivae normal ENMT: Mucous membranes are moist. Posterior pharynx clear of any exudate or lesions.Normal dentition.  Neck: normal, supple, no masses, no thyromegaly Respiratory: clear to auscultation bilaterally, no wheezing, no crackles. Normal respiratory effort. No accessory muscle use.  Cardiovascular: Regular rate and rhythm, no murmurs / rubs / gallops. No extremity edema. 2+ pedal pulses. No carotid bruits.  Abdomen: Tenderness on suprapubic area, no rebound no guarding, no masses palpated. No hepatosplenomegaly. Bowel sounds positive.  Musculoskeletal: no clubbing / cyanosis. No joint deformity upper and lower extremities. Good ROM, no contractures. Normal muscle tone.  Skin: no rashes, lesions, ulcers. No induration Neurologic: CN 2-12 grossly intact. Sensation intact, DTR normal. Strength 5/5 in all 4.  Psychiatric: Normal judgment and insight. Alert and oriented x 3. Normal mood.     Labs on Admission: I have personally reviewed following labs and imaging studies  CBC: Recent Labs  Lab 09/28/24 1211  WBC 14.8*  NEUTROABS 11.7*  HGB 10.1*  HCT 30.0*  MCV 103.1*  PLT 233   Basic Metabolic Panel: Recent Labs  Lab 09/28/24 1310   NA 130*  K 4.8  CL 91*  CO2 22  GLUCOSE 203*  BUN 67*  CREATININE 8.56*  CALCIUM  8.6*   GFR: Estimated Creatinine Clearance: 6.2 mL/min (A) (by C-G formula based on SCr of 8.56 mg/dL (H)). Liver Function Tests: Recent Labs  Lab 09/28/24 1310  AST 28  ALT 20  ALKPHOS 112  BILITOT 0.5  PROT 7.0  ALBUMIN  3.0*   No results for input(s): LIPASE, AMYLASE in the last 168 hours. No results for input(s): AMMONIA in the last 168 hours. Coagulation Profile: No results for input(s): INR, PROTIME in the last 168 hours. Cardiac Enzymes: No results for input(s): CKTOTAL, CKMB, CKMBINDEX, TROPONINI in the last 168 hours. BNP (last 3 results) No results for input(s): PROBNP in the last 8760 hours. HbA1C: No results for input(s): HGBA1C in the last 72 hours. CBG: No results for input(s): GLUCAP in the last 168 hours. Lipid Profile: No results for input(s): CHOL, HDL, LDLCALC, TRIG, CHOLHDL, LDLDIRECT in the last 72 hours. Thyroid  Function Tests: No results for input(s): TSH, T4TOTAL, FREET4, T3FREE, THYROIDAB in the last 72 hours. Anemia Panel: No results for input(s): VITAMINB12, FOLATE, FERRITIN, TIBC, IRON , RETICCTPCT in the last 72 hours. Urine analysis:    Component Value Date/Time   COLORURINE YELLOW (A) 09/28/2024 1310   APPEARANCEUR TURBID (A) 09/28/2024 1310   APPEARANCEUR Slightly cloudy 07/02/2024 1418   LABSPEC 1.010 09/28/2024 1310   LABSPEC 1.024 01/27/2014 1350   PHURINE 7.0 09/28/2024 1310   GLUCOSEU NEGATIVE 09/28/2024 1310   GLUCOSEU >=500 01/27/2014 1350   HGBUR SMALL (A) 09/28/2024 1310   BILIRUBINUR NEGATIVE 09/28/2024 1310   BILIRUBINUR Negative 07/02/2024 1418   BILIRUBINUR Negative 01/27/2014 1350   KETONESUR NEGATIVE 09/28/2024 1310   PROTEINUR 100 (A) 09/28/2024 1310   UROBILINOGEN 0.2 02/10/2022 0907   NITRITE NEGATIVE 09/28/2024 1310   LEUKOCYTESUR MODERATE (A) 09/28/2024 1310    LEUKOCYTESUR 2+ 01/27/2014 1350    Radiological Exams on Admission: No results found.  EKG: None  Assessment/Plan Principal Problem:   UTI (urinary tract infection) Active Problems:   Sepsis secondary to UTI (HCC)  (please populate well  all problems here in Problem List. (For example, if patient is on BP meds at home and you resume or decide to hold them, it is a problem that needs to be her. Same for CAD, COPD, HLD and so on)  Severe sepsis without acute endorgan damage - Sepsis is evidenced by leukocytosis, elevated lactic acid, source of infection is a UTI/possible early pyelonephritis, with initial hypotension requiring IV bolus in the ED. - Blood pressure improved and patient asymptomatic, given that patient has a history of ESRD on HD, we will not give her further IV bolus or maintenance IV fluid. - Continue ceftriaxone  - Urine culture is pending - Renal ultrasound to rule out any kidney stones or signs of pyelonephritis.  HTN -Blood pressure was low initially but improving after initial IV bolus given in the ED - Blood pressure propranolol, hold off clonidine , decrease metoprolol  dosage - Start as needed hydralazine   IDDM - Continue SSI  ESRD on HD TTS - Last dialysis was last Saturday, patient does not have any signs of fluid overload, or electrolyte abnormalities, no indication for emergent dialysis. - Likely can go home tomorrow once UTI symptoms and sepsis improve and catch up with outpatient HD schedule otherwise we will consider consult nephrology for inpatient HD.  Morbid obesity - BMI= 38 - Calorie control recommended  DVT prophylaxis: Heparin  subcu Code Status: DNR/DNI Family Communication: Husband at bedside Disposition Plan: Expect less than 2 midnight hospital stay Consults called: None Admission status: Telemetry observation   Cort ONEIDA Mana MD [facility] Pager [phone]  09/28/2024, 2:55 PM

## 2024-09-28 NOTE — Plan of Care (Signed)
  Problem: Education: Goal: Ability to describe self-care measures that may prevent or decrease complications (Diabetes Survival Skills Education) will improve Outcome: Progressing Goal: Individualized Educational Video(s) Outcome: Progressing   Problem: Education: Goal: Individualized Educational Video(s) Outcome: Progressing   Problem: Coping: Goal: Ability to adjust to condition or change in health will improve Outcome: Progressing   Problem: Fluid Volume: Goal: Ability to maintain a balanced intake and output will improve Outcome: Progressing   Problem: Nutritional: Goal: Maintenance of adequate nutrition will improve Outcome: Progressing Goal: Progress toward achieving an optimal weight will improve Outcome: Progressing   Problem: Metabolic: Goal: Ability to maintain appropriate glucose levels will improve Outcome: Progressing   Problem: Tissue Perfusion: Goal: Adequacy of tissue perfusion will improve Outcome: Progressing   Problem: Skin Integrity: Goal: Risk for impaired skin integrity will decrease Outcome: Progressing   Problem: Education: Goal: Knowledge of General Education information will improve Description: Including pain rating scale, medication(s)/side effects and non-pharmacologic comfort measures Outcome: Progressing

## 2024-09-28 NOTE — ED Notes (Signed)
 CCMD called to initiate cardiac monitoring.

## 2024-09-28 NOTE — ED Triage Notes (Signed)
 Pt in via POV, sent over from [facility]; reports some dysuria over the weekend which has now developed into some lower back pain.  Husband reports some confusion last night which appears better this morning.  KC concerned for hypotension, 90/50.  Patient reports low BP at baseline; current reading 99/50.  Patient does appear fatigued, NAD noted at his time.

## 2024-09-29 DIAGNOSIS — Z992 Dependence on renal dialysis: Secondary | ICD-10-CM

## 2024-09-29 DIAGNOSIS — N390 Urinary tract infection, site not specified: Secondary | ICD-10-CM | POA: Diagnosis not present

## 2024-09-29 DIAGNOSIS — E66812 Obesity, class 2: Secondary | ICD-10-CM | POA: Diagnosis not present

## 2024-09-29 DIAGNOSIS — N186 End stage renal disease: Secondary | ICD-10-CM

## 2024-09-29 LAB — BASIC METABOLIC PANEL WITH GFR
Anion gap: 19 — ABNORMAL HIGH (ref 5–15)
BUN: 72 mg/dL — ABNORMAL HIGH (ref 8–23)
CO2: 21 mmol/L — ABNORMAL LOW (ref 22–32)
Calcium: 9 mg/dL (ref 8.9–10.3)
Chloride: 91 mmol/L — ABNORMAL LOW (ref 98–111)
Creatinine, Ser: 8.98 mg/dL — ABNORMAL HIGH (ref 0.44–1.00)
GFR, Estimated: 4 mL/min — ABNORMAL LOW (ref >60)
Glucose, Bld: 239 mg/dL — ABNORMAL HIGH (ref 70–99)
Potassium: 4.9 mmol/L (ref 3.5–5.1)
Sodium: 131 mmol/L — ABNORMAL LOW (ref 135–145)

## 2024-09-29 LAB — GLUCOSE, CAPILLARY
Glucose-Capillary: 291 mg/dL — ABNORMAL HIGH (ref 70–99)
Glucose-Capillary: 160 mg/dL — ABNORMAL HIGH (ref 70–99)
Glucose-Capillary: 206 mg/dL — ABNORMAL HIGH (ref 70–99)

## 2024-09-29 LAB — CBC
HCT: 31.5 % — ABNORMAL LOW (ref 36.0–46.0)
Hemoglobin: 10.7 g/dL — ABNORMAL LOW (ref 12.0–15.0)
MCH: 34.5 pg — ABNORMAL HIGH (ref 26.0–34.0)
MCHC: 34 g/dL (ref 30.0–36.0)
MCV: 101.6 fL — ABNORMAL HIGH (ref 80.0–100.0)
Platelets: 234 K/uL (ref 150–400)
RBC: 3.1 MIL/uL — ABNORMAL LOW (ref 3.87–5.11)
RDW: 15.9 % — ABNORMAL HIGH (ref 11.5–15.5)
WBC: 14.8 K/uL — ABNORMAL HIGH (ref 4.0–10.5)
nRBC: 0 % (ref 0.0–0.2)

## 2024-09-29 LAB — HEPATITIS B SURFACE ANTIGEN: Hepatitis B Surface Ag: NONREACTIVE

## 2024-09-29 MED ORDER — CEPHALEXIN 500 MG PO CAPS: 500.0000 mg | ORAL_CAPSULE | Freq: Two times a day (BID) | ORAL | 0 refills | Status: DC

## 2024-09-29 MED ORDER — CEPHALEXIN 500 MG PO CAPS
500.0000 mg | ORAL_CAPSULE | Freq: Two times a day (BID) | ORAL | Status: DC
  Filled 2024-09-29: qty 1

## 2024-09-29 MED ORDER — INSULIN GLARGINE-YFGN 100 UNIT/ML ~~LOC~~ SOLN
10.0000 [IU] | Freq: Every day | SUBCUTANEOUS | Status: DC
  Administered 2024-09-29 – 2024-09-30 (×2): 10 [IU] via SUBCUTANEOUS
  Filled 2024-09-29 (×2): qty 0.1

## 2024-09-29 MED ORDER — CHLORHEXIDINE GLUCONATE CLOTH 2 % EX PADS
6.0000 | MEDICATED_PAD | Freq: Every day | CUTANEOUS | Status: DC
  Administered 2024-09-29: 6 via TOPICAL

## 2024-09-29 MED ORDER — HEPARIN SODIUM (PORCINE) 1000 UNIT/ML DIALYSIS: 1000.0000 [IU] | INTRAMUSCULAR | Status: DC | PRN

## 2024-09-29 MED ORDER — CEPHALEXIN 500 MG PO CAPS: 500.0000 mg | ORAL_CAPSULE | Freq: Two times a day (BID) | ORAL | Status: DC

## 2024-09-29 MED ORDER — PENTAFLUOROPROP-TETRAFLUOROETH EX AERO
INHALATION_SPRAY | CUTANEOUS | Status: AC
Start: 2024-09-29 — End: 2024-09-29
  Filled 2024-09-29: qty 30

## 2024-09-29 MED ORDER — ALTEPLASE 2 MG IJ SOLR: 2.0000 mg | Freq: Once | INTRAMUSCULAR | Status: DC | PRN

## 2024-09-29 NOTE — Progress Notes (Signed)
 Patient does not feel comfort going home today, feels weak, loose stools. Will try yo discharge tomorrow. Abx to oral tomorrow

## 2024-09-29 NOTE — Plan of Care (Signed)
   Problem: Nutritional: Goal: Maintenance of adequate nutrition will improve Outcome: Progressing

## 2024-09-29 NOTE — Progress Notes (Signed)
 [facility]  ROUNDING NOTE   Subjective:   Heather Lopez  is a 78 y.o.  female with end stage renal disease on peritoneal dialysis, hypertension, GERD, gout, hyeprlipidemia, diabetes mellitus type II and vertigo.  Patient presents to the emergency department complaining of back pain and has been admitted for  UTI (urinary tract infection) [N39.0] Acute pyelonephritis [N10] Sepsis without acute organ dysfunction, due to unspecified organism [facility]) [A41.9]  Patient is known to our practice and receives outpatient dialysis treatments at [facility] on a TTS schedule, supervised by Dr. Douglas.  Patient is seen and evaluated during dialysis. HEMODIALYSIS FLOWSHEET:  Blood Flow Rate (mL/min): 199 mL/min Arterial Pressure (mmHg): -99.79 mmHg Venous Pressure (mmHg): 78.38 mmHg TMP (mmHg): 14.54 mmHg Ultrafiltration Rate (mL/min): 481 mL/min Dialysate Flow Rate (mL/min): 300 ml/min  Patient was seen for this complaint at the [place] walk-in clinic this past weekend.  She was referred to the emergency department due to lower back pain.  Husband states she has had some mild confusion since this weekend as well.  Patient completely alert and oriented at this time.  Resting comfortably during dialysis.  Denies shortness of breath or cough.  Continues to have mild lower back discomfort.  Described as pressure, achy.  Labs on ED arrival concerning for sodium 130, BUN 67, creatinine 8.56 with GFR 4, lactic acid 2.0.  UA appears turbid with moderate leukocytes and proteinuria.  Blood cultures and urine culture pending.  Renal obstruction shows a left kidney nonobstructive nephrolithiasis with mild enlargement of right kidney, bilateral moderate hydronephrosis.  We have been consulted to manage dialysis needs during this admission   Objective:  Vital signs in last 24 hours:  Temp:  [98.4 F (36.9 C)-98.6 F (37 C)] 98.6 F (37 C) (11/04 1032) Pulse Rate:  [73-101] 89 (11/04  1330) Resp:  [16-22] 19 (11/04 1330) BP: (115-155)/(46-66) 137/52 (11/04 1330) SpO2:  [94 %-100 %] 96 % (11/04 1330) Weight:  [97.6 kg] 97.6 kg (11/04 1032)  Weight change:  Filed Weights   09/28/24 1155 09/29/24 1032  Weight: 99.8 kg 97.6 kg    Intake/Output: No intake/output data recorded.   Intake/Output this shift:  No intake/output data recorded.  Physical Exam: General: NAD  Head: Normocephalic, atraumatic. Moist oral mucosal membranes  Eyes: Anicteric  Neck: Supple  Lungs:  Clear to auscultation, normal effort  Heart: Regular rate and rhythm  Abdomen:  Soft, nontender  Extremities: No peripheral edema.  Neurologic: Awake, alert, conversant  Skin: Warm,dry, no rash  Access: Left AVG    Basic Metabolic Panel: Recent Labs  Lab 09/28/24 1310 09/29/24 0500  NA 130* 131*  K 4.8 4.9  CL 91* 91*  CO2 22 21*  GLUCOSE 203* 239*  BUN 67* 72*  CREATININE 8.56* 8.98*  CALCIUM  8.6* 9.0    Liver Function Tests: Recent Labs  Lab 09/28/24 1310  AST 28  ALT 20  ALKPHOS 112  BILITOT 0.5  PROT 7.0  ALBUMIN  3.0*   No results for input(s): LIPASE, AMYLASE in the last 168 hours. No results for input(s): AMMONIA in the last 168 hours.  CBC: Recent Labs  Lab 09/28/24 1211 09/29/24 0500  WBC 14.8* 14.8*  NEUTROABS 11.7*  --   HGB 10.1* 10.7*  HCT 30.0* 31.5*  MCV 103.1* 101.6*  PLT 233 234    Cardiac Enzymes: No results for input(s): CKTOTAL, CKMB, CKMBINDEX, TROPONINI in the last 168 hours.  BNP: Invalid input(s): POCBNP  CBG: Recent Labs  Lab 09/28/24 1837 09/28/24 2258 09/29/24 0841  GLUCAP 158* 182* 291*    Microbiology: Results for orders placed or performed during the hospital encounter of 09/28/24  Blood Culture (routine x 2)     Status: None (Preliminary result)   Collection Time: 09/28/24 12:12 PM   Specimen: BLOOD  Result Value Ref Range Status   Specimen Description BLOOD RIGHT ANTECUBITAL  Final   Special  Requests   Final    BOTTLES DRAWN AEROBIC AND ANAEROBIC Blood Culture adequate volume   Culture   Final    NO GROWTH < 24 HOURS Performed at [facility], [address], [place], [place] [address]    Report Status PENDING  Incomplete  Blood Culture (routine x 2)     Status: None (Preliminary result)   Collection Time: 09/28/24  2:06 PM   Specimen: BLOOD  Result Value Ref Range Status   Specimen Description BLOOD BLOOD RIGHT ARM  Final   Special Requests   Final    BOTTLES DRAWN AEROBIC ONLY Blood Culture results may not be optimal due to an inadequate volume of blood received in culture bottles   Culture   Final    NO GROWTH < 24 HOURS Performed at [facility], [address] [place], [place] [address]    Report Status PENDING  Incomplete    Coagulation Studies: No results for input(s): LABPROT, INR in the last 72 hours.  Urinalysis: Recent Labs    09/28/24 1310  COLORURINE YELLOW*  LABSPEC 1.010  PHURINE 7.0  GLUCOSEU NEGATIVE  HGBUR SMALL*  BILIRUBINUR NEGATIVE  KETONESUR NEGATIVE  PROTEINUR 100*  NITRITE NEGATIVE  LEUKOCYTESUR MODERATE*      Imaging: No results found.   Medications:     aspirin  EC  81 mg Oral Daily   atorvastatin   20 mg Oral Daily   buPROPion   150 mg Oral Daily   busPIRone   5 mg Oral Daily   [START ON 09/30/2024] cephALEXin   500 mg Oral Q12H   Chlorhexidine  Gluconate Cloth  6 each Topical Q0600   fesoterodine   4 mg Oral Daily   heparin   5,000 Units Subcutaneous Q12H   insulin  aspart  0-6 Units Subcutaneous TID WC   metoprolol  tartrate  25 mg Oral BID   sevelamer carbonate  1,600 mg Oral TID WC   acetaminophen  **OR** acetaminophen , alteplase , heparin , hydrALAZINE , loperamide , ondansetron  **OR** ondansetron  (ZOFRAN ) IV, senna-docusate  Assessment/ Plan:  [name] is a 78 y.o.  female with end stage renal disease on peritoneal dialysis, hypertension, GERD, gout, hyeprlipidemia, diabetes mellitus type II  and vertigo.  Patient is currently admitted for UTI (urinary tract infection) [N39.0] Acute pyelonephritis [N10] Sepsis without acute organ dysfunction, due to unspecified organism (HCC) [A41.9]  [facility]/TTS/left AVG  Hyponatremia, mild.  Likely secondary to dehydration from urinary tract infection.  Patient currently prescribed cephalexin  twice daily.  Encouraged to increase oral intake.  2.  End-stage renal disease on hemodialysis.  Last treatment completed on Saturday.  Patient receiving dialysis today, UF goal 1 L as tolerated.  Next treatment scheduled for Thursday.  3. Anemia of chronic kidney disease Lab Results  Component Value Date   HGB 10.7 (L) 09/29/2024    Hemoglobin within optimal range, no need for ESA's at this time.  4.  Hypertension with chronic kidney disease.  Currently receiving metoprolol .  Blood pressure 106/39.    LOS: 0 Kanoa Phillippi 11/4/20251:51 PM

## 2024-09-29 NOTE — Progress Notes (Signed)
 Mobility Specialist Progress Note:    09/29/24 0900  Mobility  Activity Ambulated with assistance  Level of Assistance Contact guard assist, steadying assist  Assistive Device Front wheel walker  Distance Ambulated (ft) 15 ft  Range of Motion/Exercises Active;All extremities  Activity Response Tolerated well  Mobility visit 1 Mobility  Mobility Specialist Start Time (ACUTE ONLY) [id number]  Mobility Specialist Stop Time (ACUTE ONLY) 0904  Mobility Specialist Time Calculation (min) (ACUTE ONLY) 11 min   Pt received in bed, husband at bedside. Agreeable to mobility, required CGA to stand and SBA to ambulate with RW. Tolerated well, asx throughout. Left with nursing, all needs met.  Sherrilee Ditty Mobility Specialist Please contact via Special Educational Needs Teacher or  Rehab office at [phone]

## 2024-09-29 NOTE — Inpatient Diabetes Management (Signed)
 Inpatient Diabetes Program Recommendations  AACE/ADA: New Consensus Statement on Inpatient Glycemic Control (2015)  Target Ranges:  Prepandial:   less than 140 mg/dL      Peak postprandial:   less than 180 mg/dL (1-2 hours)      Critically ill patients:  140 - 180 mg/dL   Lab Results  Component Value Date   GLUCAP 291 (H) 09/29/2024   HGBA1C 8.0 (H) 09/28/2024    Review of Glycemic Control  Latest Reference Range & Units 09/28/24 18:37 09/28/24 22:58 09/29/24 08:41  Glucose-Capillary 70 - 99 mg/dL 841 (H) 817 (H) 708 (H)   Diabetes history: DM 2 Outpatient Diabetes medications:  Trulicity  3 mg weekly Novolog  14 units bid Lantus  15 units q HS (patient states she was taking 30 units Lantus  daily) Current orders for Inpatient glycemic control:  Novolog  0-6 units tid with meals   Inpatient Diabetes Program Recommendations:    Consider adding Semglee  10 units daily while in the hospital.   Thanks,  Randall Bullocks, RN, BC-ADM Inpatient Diabetes Coordinator Pager [phone]  (8a-5p)

## 2024-09-29 NOTE — Progress Notes (Signed)
  Received patient in bed to unit.   Informed consent signed and in chart.    TX duration: 3.5hrs     Transported back to floor  Hand-off given to patient's nurse. No acute distress noted  no c/o   Access used: L AVG Access issues: multiple arterial sticks.  Clots pulled from arterial needles.  Weak thrill/bruit from venous aspect of access.  NP aware of above    Total UF removed: 1.0L Medication(s) given: none Post HD VS: wnl      Olivia Hurst LPN [facility]

## 2024-09-29 NOTE — Progress Notes (Signed)
 Pt receives outpt HD at  [facility] TTS. Navigator following to assist with any HD needs.  Suzen Satchel Dialysis Navigator [phone]

## 2024-09-29 NOTE — Plan of Care (Signed)
   Problem: Coping: Goal: Ability to adjust to condition or change in health will improve Outcome: Progressing

## 2024-09-29 NOTE — Discharge Summary (Signed)
 Physician Discharge Summary   Patient: Heather Lopez MRN: [id number] DOB: 1946/05/10  Admit date:     09/28/2024  Discharge date: 09/29/24  Discharge Physician: Murvin Mana   PCP: Franchot Houston, PA-C   Recommendations at discharge:   Follow-up with PCP as outpatient in 1 week.  Discharge Diagnoses: Principal Problem:   UTI (urinary tract infection) Active Problems:   Sepsis secondary to UTI (HCC)   Class 2 obesity Sepsis ruled out. Resolved Problems:   * No resolved hospital problems. [facility] Course: Heather Lopez is a 78 y.o. female with medical history significant of ESRD only on HD TTS, HTN, HLD, IDDM, obesity, anxiety/depression, presented with worsening of lower abdominal pain dysuria and back pain.  She was also nauseated yesterday, had a large loose stool today. She has mild leukocytosis, but overall does not meet sepsis criteria. He received the fluids, her dysuria has resolved today.  Blood cultures so far no growth.  Urine culture sent out.  But the patient is only making less than 1 cup of urine a day. I do not believe this is a UTI, her symptoms appear to be due to more concentrated urine from diarrhea. At this point, she is medically stable for discharge.  Assessment and Plan: Possible UTI. Gastroenteritis. Sepsis ruled out. Lactic acidosis probably due to dehydration. Patient dysuria probably just caused by more constant urine from dehydration.  Patient normally make very little urine due to end-stage renal disease.  Her urine will always be colonized with some bacteria.  Urine culture was sent out, results will not be reliable. Patient only has a mild leukocytosis, no other index of sepsis is met.  As a result, sepsis is ruled out. She has received 2 doses of Rocephin  since admission, since I could not entirely rule out UTI, I will give an another 3 days of Keflex . Patient had some nausea, large loose stool today, most likely has gastroenteritis. Today, patient  feels well.  Medically stable for discharge.  End-stage renal disease.   Hyponatremia. Mild metabolic acidosis Patient be dialyzed again today per nephrology.  Type 2 diabetes. Resume home treatment.  Class II obesity with BMI 38.12. Diet and exercise.       Consultants: Nephrology Procedures performed: None  Disposition: Home Diet recommendation:  Discharge Diet Orders (From admission, onward)     Start     Ordered   09/29/24 0000  Diet - low sodium heart healthy        09/29/24 1038   09/29/24 0000  Diet renal with fluid restriction        09/29/24 1038           Renal diet DISCHARGE MEDICATION: Allergies as of 09/29/2024       Reactions   Contrast Media  [iodinated Contrast Media] Anaphylaxis   Iodine  Swelling   (IV only) - angioedema        Medication List     TAKE these medications    aspirin  EC 81 MG tablet Take 81 mg by mouth daily.   atorvastatin  20 MG tablet Commonly known as: LIPITOR Take 20 mg by mouth at bedtime.   buPROPion  150 MG 24 hr tablet Commonly known as: WELLBUTRIN  XL Take 150 mg by mouth every morning.   busPIRone  5 MG tablet Commonly known as: BUSPAR  Take 5 mg by mouth every morning.   cephALEXin  500 MG capsule Commonly known as: KEFLEX  Take 1 capsule (500 mg total) by mouth 2 (two) times daily for  3 days. Start taking on: September 30, 2024   cloNIDine  0.1 MG tablet Commonly known as: CATAPRES  Take 1 tablet (0.1 mg total) by mouth 2 (two) times daily.   Dulaglutide  3 MG/0.5ML Soaj Inject 3 mg into the skin once a week. Sundays   Insulin  Aspart FlexPen 100 UNIT/ML Commonly known as: NOVOLOG  Inject 14 Units into the skin 2 (two) times daily.   insulin  glargine 100 UNIT/ML injection Commonly known as: LANTUS  Inject 0.15 mLs (15 Units total) into the skin at bedtime. What changed: how much to take   loperamide  2 MG tablet Commonly known as: IMODIUM  A-D Take 2 mg by mouth every 6 (six) hours as needed for  diarrhea or loose stools.   metoprolol  tartrate 100 MG tablet Commonly known as: LOPRESSOR  Take 1 tablet (100 mg total) by mouth 2 (two) times daily.   multivitamin with minerals Tabs tablet Take 1 tablet by mouth daily.   sevelamer carbonate 800 MG tablet Commonly known as: RENVELA Take 1,600 mg by mouth 3 (three) times daily.   trospium  20 MG tablet Commonly known as: SANCTURA  Take 1 tablet (20 mg total) by mouth 2 (two) times daily.        Follow-up Information     Franchot Houston, PA-C Follow up in 1 week(s).   Specialty: Physician Assistant Contact information: [address] [place] [address] [phone]                Discharge Exam: Fredricka Weights   09/28/24 1155 09/29/24 1032  Weight: 99.8 kg 97.6 kg   General exam: Appears calm and comfortable, obesity  Respiratory system: Clear to auscultation. Respiratory effort normal. Cardiovascular system: S1 & S2 heard, RRR. No JVD, murmurs, rubs, gallops or clicks. No pedal edema. Gastrointestinal system: Abdomen is nondistended, soft and nontender. No organomegaly or masses felt. Normal bowel sounds heard. Central nervous system: Alert and oriented. No focal neurological deficits. Extremities: Symmetric 5 x 5 power. Skin: No rashes, lesions or ulcers Psychiatry: Judgement and insight appear normal. Mood & affect appropriate.    Condition at discharge: good  The results of significant diagnostics from this hospitalization (including imaging, microbiology, ancillary and laboratory) are listed below for reference.   Imaging Studies: CT HEAD CODE STROKE WO CONTRAST Addendum Date: 09/23/2024 : ASPECTS 10. ---------------------------------------------------- Electronically signed by: Gilmore Molt MD 09/23/2024 03:21 PM EDT RP Workstation: [id number]   Result Date: 09/23/2024 EXAM: CT HEAD WITHOUT CONTRAST 09/03/2024 07:10:39 PM TECHNIQUE: CT of the head was performed without the  administration of intravenous contrast. Automated exposure control, iterative reconstruction, and/or weight based adjustment of the mA/kV was utilized to reduce the radiation dose to as low as reasonably achievable. COMPARISON: CT head 09/17/2023 CLINICAL HISTORY: Neuro deficit, acute, stroke suspected. FINDINGS: BRAIN AND VENTRICLES: No acute hemorrhage. No evidence of acute infarct. No hydrocephalus. No extra-axial collection. No mass effect or midline shift. Patchy white matter changes, compatible with chronic small vessel ischemic change. ORBITS: No acute abnormality. SINUSES: No acute abnormality. SOFT TISSUES AND SKULL: No acute soft tissue abnormality. No skull fracture. IMPRESSION: 1. No acute intracranial abnormality. Electronically signed by: Gilmore Molt MD 09/03/2024 07:25 PM EDT RP Workstation: [id number]   PERIPHERAL VASCULAR CATHETERIZATION Result Date: 09/17/2024 See surgical note for result.  VAS US  DUPLEX DIALYSIS ACCESS (AVF, AVG) Result Date: 09/08/2024 DIALYSIS ACCESS Patient Name:  Dally E Fahringer  Date of Exam:   09/03/2024 Medical Rec #: [id number]   Accession #:    [id number] Date of  Birth: November 16, 1946    Patient Gender: F Patient Age:   38 years Exam Location:  [facility] Procedure:      VAS US  DUPLEX DIALYSIS ACCESS (AVF, AVG) Referring Phys: ORVIN DARING --------------------------------------------------------------------------------  Reason for Exam: Routine follow up. Access Site: Left Upper Extremity. Access Type: Brachial-cephalic AVF. History: 01/09/2024: Left Brachial Cephalic AVF created.          04/23/2024: Left Arm Fistula including Central Venogram. PTA of Mid          Upper Arm Cephalic vein stenosis with 7 mm diameter Lutonix drug coated          and 6 mm dismeter High Pressure angioplasty balloon. Stent placement to          the Mid Upper Arm Cephalic vein stenosis with 8 mm dismeter by 5 cm          length Viabahn stent for greater than 50% residual stenosis  after          angioplasty. Comparison Study: 05/2024 Performing Technologist: Jerel Croak RVT  Examination Guidelines: A complete evaluation includes B-mode imaging, spectral Doppler, color Doppler, and power Doppler as needed of all accessible portions of each vessel. Unilateral testing is considered an integral part of a complete examination. Limited examinations for reoccurring indications may be performed as noted.  Findings: +--------------------+----------+-----------------+--------+ AVF                 PSV (cm/s)Flow Vol (mL/min)Comments +--------------------+----------+-----------------+--------+ Native artery inflow   158           443                +--------------------+----------+-----------------+--------+ AVF Anastomosis        431                       .43    +--------------------+----------+-----------------+--------+  +---------------+----------+-------------+----------+------------------+ OUTFLOW VEIN   PSV (cm/s)Diameter (cm)Depth (cm)     Describe      +---------------+----------+-------------+----------+------------------+ Subclavian vein   138                                              +---------------+----------+-------------+----------+------------------+ Confluence        280                                              +---------------+----------+-------------+----------+------------------+ Prox UA           659                           change in Diameter +---------------+----------+-------------+----------+------------------+ Mid UA             24                                 stent        +---------------+----------+-------------+----------+------------------+ Dist UA           635        0.18               change in Diameter +---------------+----------+-------------+----------+------------------+  +--------------+------------+----------+---------+---------+-------------------+  Diameter  Depth (cm)Branching    PSV       Flow Volume                       (cm)                        (cm/s)       (ml/min)       +--------------+------------+----------+---------+---------+-------------------+ Lt Rad Art dis                                  49                        +--------------+------------+----------+---------+---------+-------------------+ antegrade                                                                 +--------------+------------+----------+---------+---------+-------------------+  Summary: Patent AVF with three distinct areas of narrowing. A dumbell type narrowing of the cephalic vein in the distal upper arm just distal to anastomosis site that shows severely increased velocities in two areas with >600cm/s velocities. Distal to this stenosis shows poor flow into and through the stent. Distal to the stent in the proximal upper arm there is a narrowing to .14cm in the cephalic vein with velocities >399rf/d as well. Poor flow volume noted at 468ml/min calculated.  *See table(s) above for measurements and observations.  Diagnosing physician: Selinda Gu MD Electronically signed by Selinda Gu MD on 09/08/2024 at 9:07:15 AM.   --------------------------------------------------------------------------------   Final    Microbiology: Results for orders placed or performed during the hospital encounter of 09/28/24  Blood Culture (routine x 2)     Status: None (Preliminary result)   Collection Time: 09/28/24 12:12 PM   Specimen: BLOOD  Result Value Ref Range Status   Specimen Description BLOOD RIGHT ANTECUBITAL  Final   Special Requests   Final    BOTTLES DRAWN AEROBIC AND ANAEROBIC Blood Culture adequate volume   Culture   Final    NO GROWTH < 24 HOURS Performed at [facility], [address], [place], [place] [address]    Report Status PENDING  Incomplete  Blood Culture (routine x 2)     Status: None (Preliminary result)   Collection Time: 09/28/24  2:06 PM   Specimen:  BLOOD  Result Value Ref Range Status   Specimen Description BLOOD BLOOD RIGHT ARM  Final   Special Requests   Final    BOTTLES DRAWN AEROBIC ONLY Blood Culture results may not be optimal due to an inadequate volume of blood received in culture bottles   Culture   Final    NO GROWTH < 24 HOURS Performed at [facility], [address] [place], [place] [address]    Report Status PENDING  Incomplete    Labs: CBC: Recent Labs  Lab 09/28/24 1211 09/29/24 0500  WBC 14.8* 14.8*  NEUTROABS 11.7*  --   HGB 10.1* 10.7*  HCT 30.0* 31.5*  MCV 103.1* 101.6*  PLT 233 234   Basic Metabolic Panel: Recent Labs  Lab 09/28/24 1310 09/29/24 0500  NA 130* 131*  K 4.8 4.9  CL 91* 91*  CO2 22 21*  GLUCOSE 203* 239*  BUN 67* 72*  CREATININE 8.56* 8.98*  CALCIUM  8.6* 9.0   Liver Function Tests: Recent Labs  Lab 09/28/24 1310  AST 28  ALT 20  ALKPHOS 112  BILITOT 0.5  PROT 7.0  ALBUMIN  3.0*   CBG: Recent Labs  Lab 09/28/24 1837 09/28/24 2258 09/29/24 0841  GLUCAP 158* 182* 291*    Discharge time spent: 35 minutes.  Signed: Murvin Mana, MD [facility] 09/29/2024

## 2024-09-29 NOTE — TOC CM/SW Note (Signed)
 Transition of Care [facility]) - Inpatient Brief Assessment   Patient Details  Name: Heather Lopez MRN: [id number] Date of Birth: 28-Dec-1945  Transition of Care [facility]) CM/SW Contact:    Corean ONEIDA Haddock, RN Phone Number: 09/29/2024, 12:27 PM   Clinical Narrative:  Transition of Care [facility]) Screening Note   Patient Details  Name: Heather Lopez Date of Birth: 1946-08-07   Transition of Care [facility]) CM/SW Contact:    Corean ONEIDA Haddock, RN Phone Number: 09/29/2024, 12:27 PM    Transition of Care Department [facility]) has reviewed patient and no TOC needs have been identified at this time.If new patient transition needs arise, please place a TOC consult.     Transition of Care Asessment: Insurance and Status: Insurance coverage has been reviewed Patient has primary care physician: Yes     Prior/Current Home Services: No current home services Social Drivers of Health Review: SDOH reviewed no interventions necessary Readmission risk has been reviewed: No (obs status.  no score generated) Transition of care needs: no transition of care needs at this time

## 2024-09-30 DIAGNOSIS — N390 Urinary tract infection, site not specified: Secondary | ICD-10-CM | POA: Diagnosis not present

## 2024-09-30 LAB — PHOSPHORUS: Phosphorus: 4.6 mg/dL (ref 2.5–4.6)

## 2024-09-30 LAB — GLUCOSE, CAPILLARY
Glucose-Capillary: 248 mg/dL — ABNORMAL HIGH (ref 70–99)
Glucose-Capillary: 232 mg/dL — ABNORMAL HIGH (ref 70–99)

## 2024-09-30 LAB — CBC
HCT: 26.5 % — ABNORMAL LOW (ref 36.0–46.0)
Hemoglobin: 8.9 g/dL — ABNORMAL LOW (ref 12.0–15.0)
MCH: 34.4 pg — ABNORMAL HIGH (ref 26.0–34.0)
MCHC: 33.6 g/dL (ref 30.0–36.0)
MCV: 102.3 fL — ABNORMAL HIGH (ref 80.0–100.0)
Platelets: 239 K/uL (ref 150–400)
RBC: 2.59 MIL/uL — ABNORMAL LOW (ref 3.87–5.11)
RDW: 15.9 % — ABNORMAL HIGH (ref 11.5–15.5)
WBC: 9.7 K/uL (ref 4.0–10.5)
nRBC: 0 % (ref 0.0–0.2)

## 2024-09-30 LAB — BASIC METABOLIC PANEL WITH GFR
Anion gap: 15 (ref 5–15)
BUN: 44 mg/dL — ABNORMAL HIGH (ref 8–23)
CO2: 23 mmol/L (ref 22–32)
Calcium: 8.6 mg/dL — ABNORMAL LOW (ref 8.9–10.3)
Chloride: 92 mmol/L — ABNORMAL LOW (ref 98–111)
Creatinine, Ser: 6.62 mg/dL — ABNORMAL HIGH (ref 0.44–1.00)
GFR, Estimated: 6 mL/min — ABNORMAL LOW (ref >60)
Glucose, Bld: 279 mg/dL — ABNORMAL HIGH (ref 70–99)
Potassium: 4 mmol/L (ref 3.5–5.1)
Sodium: 130 mmol/L — ABNORMAL LOW (ref 135–145)

## 2024-09-30 LAB — IRON AND TIBC
Iron: 39 ug/dL (ref 28–170)
Saturation Ratios: 21 % (ref 10.4–31.8)
TIBC: 185 ug/dL — ABNORMAL LOW (ref 250–450)
UIBC: 146 ug/dL

## 2024-09-30 LAB — FOLATE: Folate: >20 ng/mL (ref >5.9)

## 2024-09-30 LAB — HEPATITIS B SURFACE ANTIBODY, QUANTITATIVE: Hep B S AB Quant (Post): <3.5 m[IU]/mL — ABNORMAL LOW

## 2024-09-30 LAB — MAGNESIUM: Magnesium: 2.1 mg/dL (ref 1.7–2.4)

## 2024-09-30 MED ORDER — CEPHALEXIN 250 MG PO CAPS
250.0000 mg | ORAL_CAPSULE | Freq: Two times a day (BID) | ORAL | Status: DC
  Administered 2024-09-30: 250 mg via ORAL
  Filled 2024-09-30: qty 1

## 2024-09-30 MED ORDER — CEPHALEXIN 500 MG PO CAPS: 500.0000 mg | ORAL_CAPSULE | Freq: Two times a day (BID) | ORAL | 0 refills | Status: AC

## 2024-09-30 NOTE — Discharge Summary (Signed)
 [facility] Discharge Summary   Patient: Heather Lopez  PCP: Franchot Houston, PA-C  Date of admission: 09/28/2024   Date of discharge:  09/30/2024     Discharge Diagnoses:   Principal Problem:   UTI (urinary tract infection) Active Problems:   ESRD on dialysis (HCC)   Sepsis secondary to UTI (HCC)   Class 2 obesity   Admitted From: Home Disposition: Home  Recommendations for Outpatient Follow-up:  PCP: In 1 week F/u with urology, scheduled 10/06/2024 830 and 300  Follow up LABS/TEST: Urine culture sensitivity report   Follow-up Information     Franchot Houston, PA-C. Go on 10/13/2024.   Specialty: Physician Assistant Why: Go at 9:30am. Contact information: [address] [place] [address] [phone]         Twylla Glendia BROCKS, MD Follow up in 1 week(s).   Specialty: Urology Why: scheduled 10/06/2024 830 and 300 Contact information: [address] [place] [address] [phone]                Diet recommendation: Carb modified diet  Activity: The patient is advised to gradually reintroduce usual activities, as tolerated  Discharge Condition: stable  Code Status: DNR-limited  History of present illness: As per the H and P dictated on admission.  Hospital Course:  Heather Lopez is a 78 y.o. female with medical history significant of ESRD only on HD TTS, HTN, HLD, IDDM, obesity, anxiety/depression, presented with worsening of lower abdominal pain dysuria and back pain.   Symptoms started 2 days ago, patient started to have dysuria with burning sensation when during urination, as well as lower abdominal pain, associated with new onset of back pain but denied any nauseous vomiting fever or chills.  She has been feeling weak since yesterday, decreased oral intake but denied any nauseous vomiting.  She denies any cough no shortness of breath. ED Course: Temperature 98.2 blood pressure 99/50 improved  after small IV bolus 12/28/2023/57.  O2 saturation 99% on room air.  Chest x-ray negative for acute infiltrates, blood work showed lactic acid 2.5 hemoglobin 10.0 WBC 14.8 BUN 67 creatinine 8.8 bicarb 22K 4.8 glucose 203.  UA showed 3+ WBC, 0 RBC moderate leukocyte negative nitrite.   Patient was given a small IV bolus 250 mL in the ED and started on ceftriaxone .   Assessment/Plan   # Severe sepsis without acute endorgan damage - Sepsis is evidenced by leukocytosis, elevated lactic acid, source of infection is a UTI/possible early pyelonephritis, with initial hypotension requiring IV bolus in the ED. - Blood pressure improved and patient asymptomatic, given that patient has a history of ESRD on HD, did not get further IV bolus or maintenance IV fluid. S/p ceftriaxone  was given during hospital stay.  Blood culture NGTD.  Urine culture growing E. Coli >100k, sensitivity report is pending.  Clinically patient is improving.  Renal ultrasound: Left kidney nonobstructive nephrolithiasis. Mild enlargement of right kidney. Bilateral moderate hydronephrosis. Recommend CT urogram for further assessment of bladder outlet obstruction. Bladder scan showed PVR 180 mL. D/w urologist, agreed with Foley catheter insertion.  Foley catheter was inserted and 250 mL urine was collected.  Patient was discharged home with Foley catheter and recommended to follow-up with urology for voiding trial as an outpatient.  Transition to oral antibiotics Keflex  500 mg p.o. twice daily for 7 days.  Advised to follow-up with urology for final culture report.   # HTN Blood pressure was low initially but improving  after initial IV bolus given in the ED.  Resumed clonidine  and metoprolol  on discharge.  Patient was advised to monitor BP at home and follow-up with PCP.  # IDDM: s/p sliding scale insulin  during hospital stay.  Discharged on home dose insulin .  Advised to monitor CBG and continue diabetic diet.  # ESRD on HD TTS - Last  dialysis was last Saturday, patient does not have any signs of fluid overload, or electrolyte abnormalities, no indication for emergent dialysis.  Nephrology was consulted, patient received hemodialysis on Tuesday 11/4 and next hemodialysis will be on Thursday 11/6.   Body mass index is 37.72 kg/m.  Nutrition Interventions:  Patient was ambulatory without any assistance. On the day of the discharge the patient's vitals were stable, and no other acute medical condition were reported by patient. the patient was felt safe to be discharge at Home.  Consultants: Nephrology D/w urology at curbside Procedures: Hemodialysis Foley catheter inserted before discharge and 250 mL of urine was collected.  Discharge Exam: General: Appear in no distress, no Rash; Oral Mucosa Clear, moist. Cardiovascular: S1 and S2 Present, no Murmur, Respiratory: normal respiratory effort, Bilateral Air entry present and no Crackles, no wheezes Abdomen: Bowel Sound present, Soft and no tenderness, no hernia Extremities: no Pedal edema, no calf tenderness Neurology: alert and oriented to time, place, and person affect appropriate.  Filed Weights   09/28/24 1155 09/29/24 1032 09/29/24 1456  Weight: 99.8 kg 97.6 kg 96.6 kg   Vitals:   09/30/24 0550 09/30/24 0749  BP: (!) 154/68 (!) 144/61  Pulse: 92 (!) 101  Resp: 20 16  Temp: 98.8 F (37.1 C) 98.5 F (36.9 C)  SpO2: 92% 92%    DISCHARGE MEDICATION: Allergies as of 09/30/2024       Reactions   Contrast Media  [iodinated Contrast Media] Anaphylaxis   Iodine  Swelling   (IV only) - angioedema        Medication List     TAKE these medications    aspirin  EC 81 MG tablet Take 81 mg by mouth daily.   atorvastatin  20 MG tablet Commonly known as: LIPITOR Take 20 mg by mouth at bedtime.   buPROPion  150 MG 24 hr tablet Commonly known as: WELLBUTRIN  XL Take 150 mg by mouth every morning.   busPIRone  5 MG tablet Commonly known as: BUSPAR  Take 5 mg  by mouth every morning.   cephALEXin  500 MG capsule Commonly known as: KEFLEX  Take 1 capsule (500 mg total) by mouth 2 (two) times daily for 7 days.   cloNIDine  0.1 MG tablet Commonly known as: CATAPRES  Take 1 tablet (0.1 mg total) by mouth 2 (two) times daily.   Dulaglutide  3 MG/0.5ML Soaj Inject 3 mg into the skin once a week. Sundays   Insulin  Aspart FlexPen 100 UNIT/ML Commonly known as: NOVOLOG  Inject 14 Units into the skin 2 (two) times daily.   insulin  glargine 100 UNIT/ML injection Commonly known as: LANTUS  Inject 0.15 mLs (15 Units total) into the skin at bedtime. What changed: how much to take   loperamide  2 MG tablet Commonly known as: IMODIUM  A-D Take 2 mg by mouth every 6 (six) hours as needed for diarrhea or loose stools.   metoprolol  tartrate 100 MG tablet Commonly known as: LOPRESSOR  Take 1 tablet (100 mg total) by mouth 2 (two) times daily.   multivitamin with minerals Tabs tablet Take 1 tablet by mouth daily.   sevelamer carbonate 800 MG tablet Commonly known as: RENVELA Take 1,600 mg by  mouth 3 (three) times daily.   trospium  20 MG tablet Commonly known as: SANCTURA  Take 1 tablet (20 mg total) by mouth 2 (two) times daily.       Allergies  Allergen Reactions   Contrast Media  [Iodinated Contrast Media] Anaphylaxis   Iodine  Swelling    (IV only) - angioedema   Discharge Instructions     Diet - low sodium heart healthy   Complete by: As directed    Diet renal with fluid restriction   Complete by: As directed    Increase activity slowly   Complete by: As directed        The results of significant diagnostics from this hospitalization (including imaging, microbiology, ancillary and laboratory) are listed below for reference.    Significant Diagnostic Studies: US  RENAL Result Date: 09/29/2024 CLINICAL DATA:  Pyelonephritis EXAM: RENAL / URINARY TRACT ULTRASOUND COMPLETE COMPARISON:  None Available. FINDINGS: Right Kidney: Renal  measurements: 14.5 x 5.7 x 6.6 cm = volume: 289 mL. Echogenicity within normal limits. Moderate hydronephrosis. Left Kidney: Renal measurements: 11.5 x 6.2 x 5.4 cm = volume: 199 mL. Echogenicity within normal limits. Moderate hydronephrosis. Nonobstructive nephrolithiasis measuring 5.5 mm in upper pole. Bladder: Appears normal for degree of bladder distention. Patient was unable to void. Other: None. IMPRESSION: Left kidney nonobstructive nephrolithiasis. Mild enlargement of right kidney . Bilateral moderate hydronephrosis. Recommend CT urogram for further assessment of bladder outlet obstruction . Electronically Signed   By: Megan  Zare M.D.   On: 09/29/2024 13:53   CT HEAD CODE STROKE WO CONTRAST Addendum Date: 09/23/2024  ADDENDUM #1  ADDENDUM: ASPECTS 10. ---------------------------------------------------- Electronically signed by: Gilmore Molt MD 09/23/2024 03:21 PM EDT RP Workstation: [id number]   Result Date: 09/23/2024  ORIGINAL REPORT  EXAM: CT HEAD WITHOUT CONTRAST 09/03/2024 07:10:39 PM TECHNIQUE: CT of the head was performed without the administration of intravenous contrast. Automated exposure control, iterative reconstruction, and/or weight based adjustment of the mA/kV was utilized to reduce the radiation dose to as low as reasonably achievable. COMPARISON: CT head 09/17/2023 CLINICAL HISTORY: Neuro deficit, acute, stroke suspected. FINDINGS: BRAIN AND VENTRICLES: No acute hemorrhage. No evidence of acute infarct. No hydrocephalus. No extra-axial collection. No mass effect or midline shift. Patchy white matter changes, compatible with chronic small vessel ischemic change. ORBITS: No acute abnormality. SINUSES: No acute abnormality. SOFT TISSUES AND SKULL: No acute soft tissue abnormality. No skull fracture. IMPRESSION: 1. No acute intracranial abnormality. Electronically signed by: Gilmore Molt MD 09/03/2024 07:25 PM EDT RP Workstation: [id number]   PERIPHERAL VASCULAR  CATHETERIZATION Result Date: 09/17/2024 See surgical note for result.  VAS US  DUPLEX DIALYSIS ACCESS (AVF, AVG) Result Date: 09/08/2024 DIALYSIS ACCESS Patient Name:  Heather Lopez  Date of Exam:   09/03/2024 Medical Rec #: [id number]   Accession #:    [id number] Date of Birth: January 18, 1946    Patient Gender: F Patient Age:   69 years Exam Location:  [facility] Procedure:      VAS US  DUPLEX DIALYSIS ACCESS (AVF, AVG) Referring Phys: ORVIN DARING --------------------------------------------------------------------------------  Reason for Exam: Routine follow up. Access Site: Left Upper Extremity. Access Type: Brachial-cephalic AVF. History: 01/09/2024: Left Brachial Cephalic AVF created.          04/23/2024: Left Arm Fistula including Central Venogram. PTA of Mid          Upper Arm Cephalic vein stenosis with 7 mm diameter Lutonix drug coated          and 6 mm dismeter High  Pressure angioplasty balloon. Stent placement to          the Mid Upper Arm Cephalic vein stenosis with 8 mm dismeter by 5 cm          length Viabahn stent for greater than 50% residual stenosis after          angioplasty. Comparison Study: 05/2024 Performing Technologist: Jerel Croak RVT  Examination Guidelines: A complete evaluation includes B-mode imaging, spectral Doppler, color Doppler, and power Doppler as needed of all accessible portions of each vessel. Unilateral testing is considered an integral part of a complete examination. Limited examinations for reoccurring indications may be performed as noted.  Findings: +--------------------+----------+-----------------+--------+ AVF                 PSV (cm/s)Flow Vol (mL/min)Comments +--------------------+----------+-----------------+--------+ Native artery inflow   158           443                +--------------------+----------+-----------------+--------+ AVF Anastomosis        431                       .43     +--------------------+----------+-----------------+--------+  +---------------+----------+-------------+----------+------------------+ OUTFLOW VEIN   PSV (cm/s)Diameter (cm)Depth (cm)     Describe      +---------------+----------+-------------+----------+------------------+ Subclavian vein   138                                              +---------------+----------+-------------+----------+------------------+ Confluence        280                                              +---------------+----------+-------------+----------+------------------+ Prox UA           659                           change in Diameter +---------------+----------+-------------+----------+------------------+ Mid UA             24                                 stent        +---------------+----------+-------------+----------+------------------+ Dist UA           635        0.18               change in Diameter +---------------+----------+-------------+----------+------------------+  +--------------+------------+----------+---------+---------+-------------------+                 Diameter  Depth (cm)Branching   PSV       Flow Volume                       (cm)                        (cm/s)       (ml/min)       +--------------+------------+----------+---------+---------+-------------------+ Lt Rad Art dis  49                        +--------------+------------+----------+---------+---------+-------------------+ antegrade                                                                 +--------------+------------+----------+---------+---------+-------------------+  Summary: Patent AVF with three distinct areas of narrowing. A dumbell type narrowing of the cephalic vein in the distal upper arm just distal to anastomosis site that shows severely increased velocities in two areas with >600cm/s velocities. Distal to this stenosis shows poor flow into  and through the stent. Distal to the stent in the proximal upper arm there is a narrowing to .14cm in the cephalic vein with velocities >399rf/d as well. Poor flow volume noted at 47ml/min calculated.  *See table(s) above for measurements and observations.  Diagnosing physician: Selinda Gu MD Electronically signed by Selinda Gu MD on 09/08/2024 at 9:07:15 AM.   --------------------------------------------------------------------------------   Final     Microbiology: Recent Results (from the past 240 hours)  Blood Culture (routine x 2)     Status: None (Preliminary result)   Collection Time: 09/28/24 12:12 PM   Specimen: BLOOD  Result Value Ref Range Status   Specimen Description BLOOD RIGHT ANTECUBITAL  Final   Special Requests   Final    BOTTLES DRAWN AEROBIC AND ANAEROBIC Blood Culture adequate volume   Culture   Final    NO GROWTH 2 DAYS Performed at [facility], [address] [place], [place] [address]    Report Status PENDING  Incomplete  Urine Culture     Status: Abnormal (Preliminary result)   Collection Time: 09/28/24  1:10 PM   Specimen: Urine, Random  Result Value Ref Range Status   Specimen Description   Final    URINE, RANDOM Performed at [facility], [address] [place], [place] [address]    Special Requests   Final    NONE Reflexed from [phone] Performed at [facility] [address] [place], [place] [address]    Culture (A)  Final    >=100,000 COLONIES/mL GRAM NEGATIVE RODS IDENTIFICATION AND SUSCEPTIBILITIES TO FOLLOW Performed at [facility], [address], [place], [place] [address]    Report Status PENDING  Incomplete  Blood Culture (routine x 2)     Status: None (Preliminary result)   Collection Time: 09/28/24  2:06 PM   Specimen: BLOOD  Result Value Ref Range Status   Specimen Description BLOOD BLOOD RIGHT ARM  Final   Special Requests   Final    BOTTLES DRAWN AEROBIC ONLY Blood Culture results may not be  optimal due to an inadequate volume of blood received in culture bottles   Culture   Final    NO GROWTH 2 DAYS Performed at [facility], [address] [place], [place] [address]    Report Status PENDING  Incomplete     Labs: CBC: Recent Labs  Lab 09/28/24 1211 09/29/24 0500 09/30/24 0901  WBC 14.8* 14.8* 9.7  NEUTROABS 11.7*  --   --   HGB 10.1* 10.7* 8.9*  HCT 30.0* 31.5* 26.5*  MCV 103.1* 101.6* 102.3*  PLT 233 234 239   Basic Metabolic Panel: Recent Labs  Lab 09/28/24 1310  09/29/24 0500 09/30/24 0901  NA 130* 131* 130*  K 4.8 4.9 4.0  CL 91* 91* 92*  CO2 22 21* 23  GLUCOSE 203* 239* 279*  BUN 67* 72* 44*  CREATININE 8.56* 8.98* 6.62*  CALCIUM  8.6* 9.0 8.6*  MG  --   --  2.1  PHOS  --   --  4.6   Liver Function Tests: Recent Labs  Lab 09/28/24 1310  AST 28  ALT 20  ALKPHOS 112  BILITOT 0.5  PROT 7.0  ALBUMIN  3.0*   No results for input(s): LIPASE, AMYLASE in the last 168 hours. No results for input(s): AMMONIA in the last 168 hours. Cardiac Enzymes: No results for input(s): CKTOTAL, CKMB, CKMBINDEX, TROPONINI in the last 168 hours. BNP (last 3 results) No results for input(s): BNP in the last 8760 hours. CBG: Recent Labs  Lab 09/29/24 0841 09/29/24 1739 09/29/24 2120 09/30/24 0752 09/30/24 1259  GLUCAP 291* 160* 206* 248* 232*    Time spent: 35 minutes  Signed:  Elvan Sor  [facility] 09/30/2024 2:09 PM

## 2024-09-30 NOTE — Progress Notes (Signed)
 Mobility Specialist Progress Note:    09/30/24 0751  Mobility  Activity Refused and notified nurse if applicable   Husband and NT at bedside, pt refused mobility d/t not wanting to to ambulate a longer distance than bathroom. Provided encouragement for OOB activity, pt stated she will ambulate when she returns home. All needs met.  Sherrilee Ditty Mobility Specialist Please contact via Special Educational Needs Teacher or  Rehab office at [phone]

## 2024-09-30 NOTE — Progress Notes (Signed)
 Foley placed prior to patient discharging. Urine sediment and pinkish. Md aware. Instructed patient on how to take care of foley. Verbalized understanding.

## 2024-09-30 NOTE — Progress Notes (Signed)
 PHARMACY NOTE:  ANTIMICROBIAL RENAL DOSAGE ADJUSTMENT  Current antimicrobial regimen includes a mismatch between antimicrobial dosage and estimated renal function.  As per policy approved by the Pharmacy & Therapeutics and Medical Executive Committees, the antimicrobial dosage will be adjusted accordingly.  Current antimicrobial dosage:  Keflex  500 mg po BID  Indication: UTI  Renal Function:  Estimated Creatinine Clearance: 5.8 mL/min (A) (by C-G formula based on SCr of 8.98 mg/dL (H)). []      On intermittent HD, scheduled: []      On CRRT    Antimicrobial dosage has been changed to:  Keflex  250 mg po BID  Additional comments:   Thank you for allowing pharmacy to be a part of this patient's care.  Kayla JULIANNA Blew, [facility] 09/30/2024 8:02 AM

## 2024-09-30 NOTE — Care Management Obs Status (Signed)
 MEDICARE OBSERVATION STATUS NOTIFICATION   Patient Details  Name: Heather Lopez MRN: [id number] Date of Birth: 1946/01/27   Medicare Observation Status Notification Given:  Yes    Rojelio SHAUNNA Rattler 09/30/2024, 12:02 PM

## 2024-09-30 NOTE — Progress Notes (Signed)
 [facility]  ROUNDING NOTE   Subjective:   Heather Lopez  is a 78 y.o.  female with end stage renal disease on hemodialysis, hypertension, GERD, gout, hyeprlipidemia, diabetes mellitus type II and vertigo.  Patient presents to the emergency department complaining of back pain and has been admitted for  UTI (urinary tract infection) [N39.0] Acute pyelonephritis [N10] Sepsis without acute organ dysfunction, due to unspecified organism [facility]) [A41.9]  Patient is known to our practice and receives outpatient dialysis treatments at [facility] on a TTS schedule, supervised by Dr. Douglas.  Patient is seen and evaluated during dialysis.  Patient seen resting in bed Husband at bedside Denies shortness of breath Tolerating meals.    Objective:  Vital signs in last 24 hours:  Temp:  [98.2 F (36.8 C)-98.8 F (37.1 C)] 98.5 F (36.9 C) (11/05 0749) Pulse Rate:  [89-104] 101 (11/05 0749) Resp:  [16-21] 16 (11/05 0749) BP: (106-154)/(35-68) 144/61 (11/05 0749) SpO2:  [92 %-100 %] 92 % (11/05 0749) Weight:  [96.6 kg] 96.6 kg (11/04 1456)  Weight change: -2.191 kg Filed Weights   09/28/24 1155 09/29/24 1032 09/29/24 1456  Weight: 99.8 kg 97.6 kg 96.6 kg    Intake/Output: I/O last 3 completed shifts: In: -  Out: 1000 [Other:1000]   Intake/Output this shift:  Total I/O In: 200 [P.O.:200] Out: 180 [Urine:180]  Physical Exam: General: NAD  Head: Normocephalic, atraumatic. Moist oral mucosal membranes  Eyes: Anicteric  Lungs:  Clear to auscultation, normal effort  Heart: Regular rate and rhythm  Abdomen:  Soft, nontender  Extremities: No peripheral edema.  Neurologic: Awake, alert, conversant  Skin: Warm,dry, no rash  Access: Left AVG    Basic Metabolic Panel: Recent Labs  Lab 09/28/24 1310 09/29/24 0500 09/30/24 0901  NA 130* 131* 130*  K 4.8 4.9 4.0  CL 91* 91* 92*  CO2 22 21* 23  GLUCOSE 203* 239* 279*  BUN 67* 72* 44*  CREATININE 8.56* 8.98*  6.62*  CALCIUM  8.6* 9.0 8.6*  MG  --   --  2.1  PHOS  --   --  4.6    Liver Function Tests: Recent Labs  Lab 09/28/24 1310  AST 28  ALT 20  ALKPHOS 112  BILITOT 0.5  PROT 7.0  ALBUMIN  3.0*   No results for input(s): LIPASE, AMYLASE in the last 168 hours. No results for input(s): AMMONIA in the last 168 hours.  CBC: Recent Labs  Lab 09/28/24 1211 09/29/24 0500 09/30/24 0901  WBC 14.8* 14.8* 9.7  NEUTROABS 11.7*  --   --   HGB 10.1* 10.7* 8.9*  HCT 30.0* 31.5* 26.5*  MCV 103.1* 101.6* 102.3*  PLT 233 234 239    Cardiac Enzymes: No results for input(s): CKTOTAL, CKMB, CKMBINDEX, TROPONINI in the last 168 hours.  BNP: Invalid input(s): POCBNP  CBG: Recent Labs  Lab 09/29/24 0841 09/29/24 1739 09/29/24 2120 09/30/24 0752 09/30/24 1259  GLUCAP 291* 160* 206* 248* 232*    Microbiology: Results for orders placed or performed during the hospital encounter of 09/28/24  Blood Culture (routine x 2)     Status: None (Preliminary result)   Collection Time: 09/28/24 12:12 PM   Specimen: BLOOD  Result Value Ref Range Status   Specimen Description BLOOD RIGHT ANTECUBITAL  Final   Special Requests   Final    BOTTLES DRAWN AEROBIC AND ANAEROBIC Blood Culture adequate volume   Culture   Final    NO GROWTH 2 DAYS Performed at [facility], [address], [place], [place] [address]    Report Status PENDING  Incomplete  Urine Culture     Status: Abnormal (Preliminary result)   Collection Time: 09/28/24  1:10 PM   Specimen: Urine, Random  Result Value Ref Range Status   Specimen Description   Final    URINE, RANDOM Performed at [facility], [address], [place], [place] [address]    Special Requests   Final    NONE Reflexed from [id number] Performed at [facility], [address] [place], [place] [address]    Culture (A)  Final    >=100,000 COLONIES/mL GRAM NEGATIVE RODS IDENTIFICATION AND SUSCEPTIBILITIES TO  FOLLOW Performed at [facility], [address] [place], [place] [address]    Report Status PENDING  Incomplete  Blood Culture (routine x 2)     Status: None (Preliminary result)   Collection Time: 09/28/24  2:06 PM   Specimen: BLOOD  Result Value Ref Range Status   Specimen Description BLOOD BLOOD RIGHT ARM  Final   Special Requests   Final    BOTTLES DRAWN AEROBIC ONLY Blood Culture results may not be optimal due to an inadequate volume of blood received in culture bottles   Culture   Final    NO GROWTH 2 DAYS Performed at [facility], [address] [place], [place] [address]    Report Status PENDING  Incomplete    Coagulation Studies: No results for input(s): LABPROT, INR in the last 72 hours.  Urinalysis: Recent Labs    09/28/24 1310  COLORURINE YELLOW*  LABSPEC 1.010  PHURINE 7.0  GLUCOSEU NEGATIVE  HGBUR SMALL*  BILIRUBINUR NEGATIVE  KETONESUR NEGATIVE  PROTEINUR 100*  NITRITE NEGATIVE  LEUKOCYTESUR MODERATE*      Imaging: US  RENAL Result Date: 09/29/2024 CLINICAL DATA:  Pyelonephritis EXAM: RENAL / URINARY TRACT ULTRASOUND COMPLETE COMPARISON:  None Available. FINDINGS: Right Kidney: Renal measurements: 14.5 x 5.7 x 6.6 cm = volume: 289 mL. Echogenicity within normal limits. Moderate hydronephrosis. Left Kidney: Renal measurements: 11.5 x 6.2 x 5.4 cm = volume: 199 mL. Echogenicity within normal limits. Moderate hydronephrosis. Nonobstructive nephrolithiasis measuring 5.5 mm in upper pole. Bladder: Appears normal for degree of bladder distention. Patient was unable to void. Other: None. IMPRESSION: Left kidney nonobstructive nephrolithiasis. Mild enlargement of right kidney . Bilateral moderate hydronephrosis. Recommend CT urogram for further assessment of bladder outlet obstruction . Electronically Signed   By: Megan  Zare M.D.   On: 09/29/2024 13:53     Medications:     aspirin  EC  81 mg Oral Daily   atorvastatin   20 mg Oral Daily    buPROPion   150 mg Oral Daily   busPIRone   5 mg Oral Daily   cephALEXin   250 mg Oral Q12H   Chlorhexidine  Gluconate Cloth  6 each Topical Q0600   fesoterodine   4 mg Oral Daily   heparin   5,000 Units Subcutaneous Q12H   insulin  aspart  0-6 Units Subcutaneous TID WC   insulin  glargine-yfgn  10 Units Subcutaneous Daily   metoprolol  tartrate  25 mg Oral BID   sevelamer carbonate  1,600 mg Oral TID WC   acetaminophen  **OR** acetaminophen , hydrALAZINE , loperamide , ondansetron  **OR** ondansetron  (ZOFRAN ) IV, senna-docusate  Assessment/ Plan:  [name] is a 78 y.o.  female with end stage renal disease on hemodialysis, hypertension, GERD, gout, hyeprlipidemia, diabetes mellitus type II and vertigo.  Patient is currently admitted for UTI (urinary tract infection) [N39.0] Acute  pyelonephritis [N10] Sepsis without acute organ dysfunction, due to unspecified organism (HCC) [A41.9]  [facility]/TTS/left AVG  Hyponatremia, mild.  Likely secondary to dehydration from urinary tract infection.  Patient currently prescribed cephalexin  twice daily.  Sodium remains decreased but stable. Appetite improving.   2.  End-stage renal disease on hemodialysis.  Last treatment completed on Saturday.  Received dialysis yesterday, UF 1L achieved.  Next treatment scheduled for Thursday.  3. Anemia of chronic kidney disease Lab Results  Component Value Date   HGB 8.9 (L) 09/30/2024    Hemoglobin borderline. Does receive Mircera at outpatient clinic.   4.  Hypertension with chronic kidney disease.  Currently receiving metoprolol .  Blood pressure 144/61    LOS: 0 Orren Pietsch 11/5/20251:15 PM

## 2024-10-01 LAB — URINE CULTURE: Culture: >=100000 — AB

## 2024-10-03 LAB — CULTURE, BLOOD (ROUTINE X 2)
Culture: NO GROWTH
Culture: NO GROWTH
Special Requests: ADEQUATE

## 2024-10-06 ENCOUNTER — Ambulatory Visit: Admitting: Physician Assistant

## 2024-10-06 ENCOUNTER — Ambulatory Visit

## 2024-10-13 NOTE — Progress Notes (Unsigned)
 Catheter Removal  Patient is present today for a catheter removal.  ***ml of water was drained from the balloon. A ***FR foley cath was removed from the bladder, {dnt complications:20057}. Patient tolerated well.  Performed by: ***  Follow up/ Additional notes: No follow-ups on file.

## 2024-10-14 ENCOUNTER — Ambulatory Visit (INDEPENDENT_AMBULATORY_CARE_PROVIDER_SITE_OTHER): Admitting: Physician Assistant

## 2024-10-14 DIAGNOSIS — N3281 Overactive bladder: Secondary | ICD-10-CM

## 2024-10-14 DIAGNOSIS — N390 Urinary tract infection, site not specified: Secondary | ICD-10-CM

## 2024-10-14 LAB — BLADDER SCAN AMB NON-IMAGING: Scan Result: 0

## 2024-10-14 NOTE — Progress Notes (Signed)
 10/14/2024 3:41 PM   Jenkins [facility] 1946/02/03 [id number]  CC: Chief Complaint  Patient presents with   Follow-up   HPI: Heather Lopez is a 78 y.o. female with PMH ESRD on HD with oliguria, uncontrolled diabetes, nephrolithiasis, bilateral VUR, right hydronephrosis previously managed with a chronic right ureteral stent, and OAB on trospium  20 mg twice daily who presents today for voiding trial.  She was admitted from 09/28/2024 to 09/30/2024 with severe sepsis due to UTI/possible early pyelonephritis.  Foley catheter was placed for maximum urinary decompression, with 250 mL urine output with Foley placement.  Foley removed in the morning; see separate note.  She returned in the afternoon. She has voided without difficulty.  PVR 0 mL.  They bring a voiding diary with them that shows progressive clearance of her urinary output with the Foley catheter in place.  She continues to be oliguric in the setting of her ESRD.  She remains on trospium .  PMH: Past Medical History:  Diagnosis Date   Acute renal failure 06/27/2021   Anemia in chronic kidney disease    Anxiety    Aortic atherosclerosis    Atrial fibrillation (HCC)    in 2022 while in hospital for sepsis   Cataract    Depression    Dysrhythmia    ESRD on dialysis (HCC)    M, W, F   GERD (gastroesophageal reflux disease)    Gout    History of kidney stones    Hydronephrosis of right kidney    Hyperlipidemia    Hypertension, essential    Nausea & vomiting 12/02/2021   Obesity    Obstruction of left ureteropelvic junction (UPJ) due to stone 06/2021   Osteoarthritis    Perforated duodenal ulcer (HCC) 06/27/2021   Pyonephrosis 06/2021   left   Septic shock (HCC) 06/27/2021   Spontaneous bacterial peritonitis (HCC)    Subdural hematoma (HCC) 08/2023   Type 2 diabetes mellitus with stage 5 chronic kidney disease (HCC)    Vertigo     Surgical History: Past Surgical History:  Procedure Laterality Date   A/V FISTULAGRAM Left  04/23/2024   Procedure: A/V Fistulagram;  Surgeon: Marea Selinda RAMAN, MD;  Location: [facility];  Service: Cardiovascular;  Laterality: Left;   A/V FISTULAGRAM Left 09/17/2024   Procedure: A/V Fistulagram;  Surgeon: Marea Selinda RAMAN, MD;  Location: [facility];  Service: Cardiovascular;  Laterality: Left;   AV FISTULA PLACEMENT Left 01/09/2024   Procedure: ARTERIOVENOUS (AV) FISTULA CREATION (BRACHIAL CEPHALIC);  Surgeon: Marea Selinda RAMAN, MD;  Location: [facility];  Service: Vascular;  Laterality: Left;   CAPD REMOVAL N/A 08/02/2023   Procedure: CONTINUOUS AMBULATORY PERITONEAL DIALYSIS  (CAPD) CATHETER REMOVAL;  Surgeon: Jordis Laneta FALCON, MD;  Location: [facility];  Service: General;  Laterality: N/A;   CATARACT EXTRACTION, BILATERAL Bilateral 2019   COLONOSCOPY WITH PROPOFOL  N/A 08/19/2015   Procedure: COLONOSCOPY WITH PROPOFOL ;  Surgeon: Rogelia Copping, MD;  Location: [facility];  Service: Endoscopy;  Laterality: N/A;  diabetic - insulin    CYSTOSCOPY W/ RETROGRADES Bilateral 12/22/2019   Procedure: CYSTOSCOPY WITH RETROGRADE PYELOGRAM;  Surgeon: Twylla Glendia BROCKS, MD;  Location: [facility];  Service: Urology;  Laterality: Bilateral;   CYSTOSCOPY W/ URETERAL STENT PLACEMENT Bilateral 07/05/2021   Procedure: CYSTOSCOPY WITH RETROGRADE PYELOGRAM/BILATERAL URETERAL STENT PLACEMENT;  Surgeon: Twylla Glendia BROCKS, MD;  Location: [facility];  Service: Urology;  Laterality: Bilateral;   CYSTOSCOPY W/ URETERAL STENT PLACEMENT Bilateral 12/26/2021   Procedure: CYSTOSCOPY  WITH STENT REMOVAL;  Surgeon: Twylla Glendia BROCKS, MD;  Location: [facility];  Service: Urology;  Laterality: Bilateral;   CYSTOSCOPY W/ URETERAL STENT PLACEMENT Right 03/13/2022   Procedure: CYSTOSCOPY WITH RETROGRADE PYELOGRAM/URETERAL STENT PLACEMENT;  Surgeon: Twylla Glendia BROCKS, MD;  Location: [facility];  Service: Urology;  Laterality: Right;   CYSTOSCOPY W/ URETERAL STENT PLACEMENT Right 01/01/2023   Procedure: CYSTOSCOPY WITH STENT  EXCHANGE;  Surgeon: Twylla Glendia BROCKS, MD;  Location: [facility];  Service: Urology;  Laterality: Right;   CYSTOSCOPY W/ URETERAL STENT REMOVAL Right 05/08/2022   Procedure: CYSTOSCOPY WITH STENT REMOVAL;  Surgeon: Twylla Glendia BROCKS, MD;  Location: [facility];  Service: Urology;  Laterality: Right;   CYSTOSCOPY W/ URETERAL STENT REMOVAL Right 07/16/2024   Procedure: REMOVAL, STENT, URETER, CYSTOSCOPIC;  Surgeon: Twylla Glendia BROCKS, MD;  Location: [facility];  Service: Urology;  Laterality: Right;   CYSTOSCOPY WITH BIOPSY N/A 12/22/2019   Procedure: CYSTOSCOPY WITH bladder BIOPSY;  Surgeon: Twylla Glendia BROCKS, MD;  Location: [facility];  Service: Urology;  Laterality: N/A;   CYSTOSCOPY WITH BIOPSY  03/13/2022   Procedure: CYSTOSCOPY WITH BLADDER BIOPSY;  Surgeon: Twylla Glendia BROCKS, MD;  Location: [facility];  Service: Urology;;   CYSTOSCOPY WITH STENT PLACEMENT Right 12/22/2019   Procedure: CYSTOSCOPY WITH STENT PLACEMENT;  Surgeon: Twylla Glendia BROCKS, MD;  Location: [facility];  Service: Urology;  Laterality: Right;   CYSTOSCOPY WITH STENT PLACEMENT Right 07/03/2022   Procedure: CYSTOSCOPY/RIGHT URETEROSCOPY WITH STENT PLACEMENT;  Surgeon: Twylla Glendia BROCKS, MD;  Location: [facility];  Service: Urology;  Laterality: Right;   CYSTOSCOPY WITH URETEROSCOPY Bilateral 12/22/2019   Procedure: CYSTOSCOPY WITH URETEROSCOPY;  Surgeon: Twylla Glendia BROCKS, MD;  Location: [facility];  Service: Urology;  Laterality: Bilateral;   CYSTOSCOPY/URETEROSCOPY/HOLMIUM LASER/STENT PLACEMENT Right 12/22/2019   Procedure: CYSTOSCOPY/URETEROSCOPY/HOLMIUM LASER/STENT PLACEMENT;  Surgeon: Twylla Glendia BROCKS, MD;  Location: [facility];  Service: Urology;  Laterality: Right;   CYSTOSCOPY/URETEROSCOPY/HOLMIUM LASER/STENT PLACEMENT Bilateral 12/08/2021   Procedure: CYSTOSCOPY/URETEROSCOPY/HOLMIUM LASER/STENT PLACEMENT;  Surgeon: Francisca Redell BROCKS, MD;  Location: [facility];  Service: Urology;  Laterality: Bilateral;   DIALYSIS/PERMA CATHETER INSERTION N/A  06/29/2021   Procedure: DIALYSIS/PERMA CATHETER INSERTION;  Surgeon: Marea Selinda RAMAN, MD;  Location: [facility];  Service: Cardiovascular;  Laterality: N/A;   DIALYSIS/PERMA CATHETER INSERTION N/A 07/13/2021   Procedure: DIALYSIS/PERMA CATHETER INSERTION;  Surgeon: Marea Selinda RAMAN, MD;  Location: [facility];  Service: Cardiovascular;  Laterality: N/A;   DIALYSIS/PERMA CATHETER INSERTION N/A 08/01/2023   Procedure: DIALYSIS/PERMA CATHETER INSERTION;  Surgeon: Marea Selinda RAMAN, MD;  Location: [facility];  Service: Cardiovascular;  Laterality: N/A;   DIALYSIS/PERMA CATHETER REMOVAL N/A 06/18/2024   Procedure: DIALYSIS/PERMA CATHETER REMOVAL;  Surgeon: Marea Selinda RAMAN, MD;  Location: [facility];  Service: Cardiovascular;  Laterality: N/A;   EYE SURGERY     INCISIONAL HERNIA REPAIR  06/28/2023   IR GASTROSTOMY TUBE MOD SED  08/01/2021   LAPAROTOMY N/A 06/29/2021   Procedure: EXPLORATORY LAPAROTOMY WITH REPAIR OF DUODENAL PERFORATION;  Surgeon: Desiderio Schanz, MD;  Location: [facility];  Service: General;  Laterality: N/A;   POLYPECTOMY  08/19/2015   Procedure: POLYPECTOMY;  Surgeon: Rogelia Copping, MD;  Location: [facility];  Service: Endoscopy;;   TUBAL LIGATION  1992    Home Medications:  Allergies as of 10/14/2024       Reactions   Contrast Media  [iodinated Contrast Media] Anaphylaxis   Iodine  Swelling   (IV only) - angioedema  Medication List        Accurate as of October 14, 2024  3:41 PM. If you have any questions, ask your nurse or doctor.          aspirin  EC 81 MG tablet Take 81 mg by mouth daily.   atorvastatin  20 MG tablet Commonly known as: LIPITOR Take 20 mg by mouth at bedtime.   buPROPion  150 MG 24 hr tablet Commonly known as: WELLBUTRIN  XL Take 150 mg by mouth every morning.   busPIRone  5 MG tablet Commonly known as: BUSPAR  Take 5 mg by mouth every morning.   cloNIDine  0.1 MG tablet Commonly known as: CATAPRES  Take  1 tablet (0.1 mg total) by mouth 2 (two) times daily.   Dulaglutide  3 MG/0.5ML Soaj Inject 3 mg into the skin once a week. Sundays   Insulin  Aspart FlexPen 100 UNIT/ML Commonly known as: NOVOLOG  Inject 14 Units into the skin 2 (two) times daily.   insulin  glargine 100 UNIT/ML injection Commonly known as: LANTUS  Inject 0.15 mLs (15 Units total) into the skin at bedtime. What changed: how much to take   loperamide  2 MG tablet Commonly known as: IMODIUM  A-D Take 2 mg by mouth every 6 (six) hours as needed for diarrhea or loose stools.   metoprolol  tartrate 100 MG tablet Commonly known as: LOPRESSOR  Take 1 tablet (100 mg total) by mouth 2 (two) times daily.   multivitamin with minerals Tabs tablet Take 1 tablet by mouth daily.   sevelamer carbonate 800 MG tablet Commonly known as: RENVELA Take 1,600 mg by mouth 3 (three) times daily.   trospium  20 MG tablet Commonly known as: SANCTURA  Take 1 tablet (20 mg total) by mouth 2 (two) times daily.        Allergies:  Allergies  Allergen Reactions   Contrast Media  [Iodinated Contrast Media] Anaphylaxis   Iodine  Swelling    (IV only) - angioedema    Family History: Family History  Problem Relation Age of Onset   Diabetes Father    Cancer Father        lung cancer   Diabetes Brother    Stroke Mother     Social History:   reports that she quit smoking about 52 years ago. Her smoking use included cigarettes. She started smoking about 58 years ago. She has a 4.5 pack-year smoking history. She has never used smokeless tobacco. She reports that she does not drink alcohol and does not use drugs.  Physical Exam: There were no vitals taken for this visit.  Constitutional:  Alert and oriented, no acute distress, nontoxic appearing HEENT: [place], AT Cardiovascular: No clubbing, cyanosis, or edema Respiratory: Normal respiratory effort, no increased work of breathing Skin: No rashes, bruises or suspicious lesions Neurologic:  Grossly intact, no focal deficits, moving all 4 extremities Psychiatric: Normal mood and affect  Laboratory Data: Results for orders placed or performed in visit on 10/14/24  BLADDER SCAN AMB NON-IMAGING   Collection Time: 10/14/24  3:29 PM  Result Value Ref Range   Scan Result 0 ml    Assessment & Plan:   1. Complicated UTI (urinary tract infection) (Primary) Voiding trial passed.  No indication for Foley replacement at this time. - BLADDER SCAN AMB NON-IMAGING  2. OAB (overactive bladder) We discussed that she may no longer require OAB meds given her oliguria.  I recommended a trial off trospium , and we can certainly resume this if she starts to have bothersome bladder symptoms.  She is in agreement.  Return in about 6 weeks (around 11/25/2024) for Postop f/u with Dr. Twylla.  Lucie Hones, PA-C  [facility] [place] [address], [address] [place], [place] [address] [phone]

## 2024-10-20 ENCOUNTER — Ambulatory Visit: Admitting: Urology

## 2024-10-21 ENCOUNTER — Other Ambulatory Visit (INDEPENDENT_AMBULATORY_CARE_PROVIDER_SITE_OTHER): Payer: Self-pay | Admitting: Vascular Surgery

## 2024-10-21 ENCOUNTER — Ambulatory Visit: Admitting: Urology

## 2024-10-21 DIAGNOSIS — N186 End stage renal disease: Secondary | ICD-10-CM

## 2024-10-29 ENCOUNTER — Other Ambulatory Visit (INDEPENDENT_AMBULATORY_CARE_PROVIDER_SITE_OTHER)

## 2024-10-29 ENCOUNTER — Ambulatory Visit (INDEPENDENT_AMBULATORY_CARE_PROVIDER_SITE_OTHER): Admitting: Nurse Practitioner

## 2024-10-29 ENCOUNTER — Encounter (INDEPENDENT_AMBULATORY_CARE_PROVIDER_SITE_OTHER): Payer: Self-pay | Admitting: Nurse Practitioner

## 2024-10-29 VITALS — BP 128/73 | HR 99 | Resp 17 | Ht 63.0 in | Wt 203.0 lb

## 2024-10-29 DIAGNOSIS — N186 End stage renal disease: Secondary | ICD-10-CM

## 2024-10-29 DIAGNOSIS — Z794 Long term (current) use of insulin: Secondary | ICD-10-CM | POA: Diagnosis not present

## 2024-10-29 DIAGNOSIS — E1122 Type 2 diabetes mellitus with diabetic chronic kidney disease: Secondary | ICD-10-CM | POA: Diagnosis not present

## 2024-10-29 DIAGNOSIS — I10 Essential (primary) hypertension: Secondary | ICD-10-CM | POA: Diagnosis not present

## 2024-10-29 DIAGNOSIS — Z992 Dependence on renal dialysis: Secondary | ICD-10-CM | POA: Diagnosis not present

## 2024-11-01 ENCOUNTER — Encounter (INDEPENDENT_AMBULATORY_CARE_PROVIDER_SITE_OTHER): Payer: Self-pay | Admitting: Nurse Practitioner

## 2024-11-01 NOTE — Progress Notes (Signed)
 Subjective:    Patient ID: Heather Lopez, female    DOB: February 06, 1946, 78 y.o.   MRN: [id number] Chief Complaint  Patient presents with   Follow-up    fu 6 weeks + HDA    The patient returns to the office for followup status post intervention of their dialysis access on 09/17/2024.   Following the intervention the access function has significantly improved, with better flow rates and improved KT/V. The patient has not been experiencing increased bleeding times following decannulation and the patient denies increased recirculation. The patient denies an increase in arm swelling. At the present time the patient denies hand pain.  No recent shortening of the patient's walking distance or new symptoms consistent with claudication.  No history of rest pain symptoms. No new ulcers or wounds of the lower extremities have occurred.  The patient denies amaurosis fugax or recent TIA symptoms. There are no recent neurological changes noted. There is no history of DVT, PE or superficial thrombophlebitis. No recent episodes of angina or shortness of breath documented.   Duplex ultrasound of the AV access shows a patent access.  The previously noted stenosis is improved compared to last study.  Flow volume today is 485 cc/min (previous flow volume was 443 cc/min)       Discussed the use of AI scribe software for clinical note transcription with the patient, who gave verbal consent to proceed.  History of Present Illness     Results    Review of Systems  Hematological:  Does not bruise/bleed easily.  All other systems reviewed and are negative.      Objective:   Physical Exam Vitals reviewed.  HENT:     Head: Normocephalic.  Cardiovascular:     Rate and Rhythm: Normal rate.     Pulses: Normal pulses.     Arteriovenous access: Left arteriovenous access is present.     Comments: Good thrill and bruit Pulmonary:     Effort: Pulmonary effort is normal.  Skin:    General: Skin is warm  and dry.  Neurological:     Mental Status: She is alert and oriented to person, place, and time.  Psychiatric:        Mood and Affect: Mood normal.        Behavior: Behavior normal.        Thought Content: Thought content normal.        Judgment: Judgment normal.     Physical Exam   BP 128/73   Pulse 99   Resp 17   Ht 5' 3 (1.6 m)   Wt 203 lb (92.1 kg)   BMI 35.96 kg/m   Past Medical History:  Diagnosis Date   Acute renal failure 06/27/2021   Anemia in chronic kidney disease    Anxiety    Aortic atherosclerosis    Atrial fibrillation (HCC)    in 2022 while in hospital for sepsis   Cataract    Depression    Dysrhythmia    ESRD on dialysis (HCC)    M, W, F   GERD (gastroesophageal reflux disease)    Gout    History of kidney stones    Hydronephrosis of right kidney    Hyperlipidemia    Hypertension, essential    Nausea & vomiting 12/02/2021   Obesity    Obstruction of left ureteropelvic junction (UPJ) due to stone 06/2021   Osteoarthritis    Perforated duodenal ulcer (HCC) 06/27/2021   Pyonephrosis 06/2021   left  Septic shock (HCC) 06/27/2021   Spontaneous bacterial peritonitis (HCC)    Subdural hematoma (HCC) 08/2023   Type 2 diabetes mellitus with stage 5 chronic kidney disease (HCC)    Vertigo     Social History   Socioeconomic History   Marital status: Married    Spouse name: Elspeth   Number of children: Not on file   Years of education: Not on file   Highest education level: Not on file  Occupational History   Not on file  Tobacco Use   Smoking status: Former    Current packs/day: 0.00    Average packs/day: 0.8 packs/day for 6.0 years (4.5 ttl pk-yrs)    Types: Cigarettes    Start date: 7    Quit date: 81    Years since quitting: 52.9   Smokeless tobacco: Never   Tobacco comments:    quit 1973  Vaping Use   Vaping status: Never Used  Substance and Sexual Activity   Alcohol use: No    Alcohol/week: 0.0 standard drinks of  alcohol   Drug use: No   Sexual activity: Not on file  Other Topics Concern   Not on file  Social History Narrative   Not on file   Social Drivers of Health   Financial Resource Strain: Low Risk  (04/30/2024)   Received from [facility]   Overall Financial Resource Strain (CARDIA)    Difficulty of Paying Living Expenses: Not hard at all  Food Insecurity: No Food Insecurity (09/28/2024)   Hunger Vital Sign    Worried About Running Out of Food in the Last Year: Never true    Ran Out of Food in the Last Year: Never true  Transportation Needs: No Transportation Needs (09/28/2024)   PRAPARE - Administrator, Civil Service (Medical): No    Lack of Transportation (Non-Medical): No  Physical Activity: Unknown (07/14/2023)   Received from [facility]   Exercise Vital Sign    On average, how many days per week do you engage in moderate to strenuous exercise (like a brisk walk)?: 0 days    Minutes of Exercise per Session: Not on file  Stress: No Stress Concern Present (07/14/2023)   Received from [facility] - Occupational Stress Questionnaire    Feeling of Stress : Not at all  Social Connections: Socially Integrated (09/28/2024)   Social Connection and Isolation Panel    Frequency of Communication with Friends and Family: More than three times a week    Frequency of Social Gatherings with Friends and Family: More than three times a week    Attends Religious Services: More than 4 times per year    Active Member of Clubs or Organizations: Yes    Attends Banker Meetings: More than 4 times per year    Marital Status: Married  Catering Manager Violence: Not At Risk (09/28/2024)   Humiliation, Afraid, Rape, and Kick questionnaire    Fear of Current or Ex-Partner: No    Emotionally Abused: No    Physically Abused: No    Sexually Abused: No    Past Surgical History:  Procedure Laterality Date   A/V  FISTULAGRAM Left 04/23/2024   Procedure: A/V Fistulagram;  Surgeon: Marea Selinda RAMAN, MD;  Location: [facility];  Service: Cardiovascular;  Laterality: Left;   A/V FISTULAGRAM Left 09/17/2024   Procedure: A/V Fistulagram;  Surgeon: Marea Selinda RAMAN, MD;  Location: [facility];  Service: Cardiovascular;  Laterality: Left;   AV FISTULA PLACEMENT Left 01/09/2024   Procedure: ARTERIOVENOUS (AV) FISTULA CREATION (BRACHIAL CEPHALIC);  Surgeon: Marea Selinda RAMAN, MD;  Location: [facility];  Service: Vascular;  Laterality: Left;   CAPD REMOVAL N/A 08/02/2023   Procedure: CONTINUOUS AMBULATORY PERITONEAL DIALYSIS  (CAPD) CATHETER REMOVAL;  Surgeon: Jordis Laneta FALCON, MD;  Location: [facility];  Service: General;  Laterality: N/A;   CATARACT EXTRACTION, BILATERAL Bilateral 2019   COLONOSCOPY WITH PROPOFOL  N/A 08/19/2015   Procedure: COLONOSCOPY WITH PROPOFOL ;  Surgeon: Rogelia Copping, MD;  Location: [facility];  Service: Endoscopy;  Laterality: N/A;  diabetic - insulin    CYSTOSCOPY W/ RETROGRADES Bilateral 12/22/2019   Procedure: CYSTOSCOPY WITH RETROGRADE PYELOGRAM;  Surgeon: Twylla Glendia BROCKS, MD;  Location: [facility];  Service: Urology;  Laterality: Bilateral;   CYSTOSCOPY W/ URETERAL STENT PLACEMENT Bilateral 07/05/2021   Procedure: CYSTOSCOPY WITH RETROGRADE PYELOGRAM/BILATERAL URETERAL STENT PLACEMENT;  Surgeon: Twylla Glendia BROCKS, MD;  Location: [facility];  Service: Urology;  Laterality: Bilateral;   CYSTOSCOPY W/ URETERAL STENT PLACEMENT Bilateral 12/26/2021   Procedure: CYSTOSCOPY WITH STENT REMOVAL;  Surgeon: Twylla Glendia BROCKS, MD;  Location: [facility];  Service: Urology;  Laterality: Bilateral;   CYSTOSCOPY W/ URETERAL STENT PLACEMENT Right 03/13/2022   Procedure: CYSTOSCOPY WITH RETROGRADE PYELOGRAM/URETERAL STENT PLACEMENT;  Surgeon: Twylla Glendia BROCKS, MD;  Location: [facility];  Service: Urology;  Laterality: Right;   CYSTOSCOPY W/ URETERAL STENT PLACEMENT Right 01/01/2023   Procedure: CYSTOSCOPY  WITH STENT EXCHANGE;  Surgeon: Twylla Glendia BROCKS, MD;  Location: [facility];  Service: Urology;  Laterality: Right;   CYSTOSCOPY W/ URETERAL STENT REMOVAL Right 05/08/2022   Procedure: CYSTOSCOPY WITH STENT REMOVAL;  Surgeon: Twylla Glendia BROCKS, MD;  Location: [facility];  Service: Urology;  Laterality: Right;   CYSTOSCOPY W/ URETERAL STENT REMOVAL Right 07/16/2024   Procedure: REMOVAL, STENT, URETER, CYSTOSCOPIC;  Surgeon: Twylla Glendia BROCKS, MD;  Location: [facility];  Service: Urology;  Laterality: Right;   CYSTOSCOPY WITH BIOPSY N/A 12/22/2019   Procedure: CYSTOSCOPY WITH bladder BIOPSY;  Surgeon: Twylla Glendia BROCKS, MD;  Location: [facility];  Service: Urology;  Laterality: N/A;   CYSTOSCOPY WITH BIOPSY  03/13/2022   Procedure: CYSTOSCOPY WITH BLADDER BIOPSY;  Surgeon: Twylla Glendia BROCKS, MD;  Location: [facility];  Service: Urology;;   CYSTOSCOPY WITH STENT PLACEMENT Right 12/22/2019   Procedure: CYSTOSCOPY WITH STENT PLACEMENT;  Surgeon: Twylla Glendia BROCKS, MD;  Location: [facility];  Service: Urology;  Laterality: Right;   CYSTOSCOPY WITH STENT PLACEMENT Right 07/03/2022   Procedure: CYSTOSCOPY/RIGHT URETEROSCOPY WITH STENT PLACEMENT;  Surgeon: Twylla Glendia BROCKS, MD;  Location: [facility];  Service: Urology;  Laterality: Right;   CYSTOSCOPY WITH URETEROSCOPY Bilateral 12/22/2019   Procedure: CYSTOSCOPY WITH URETEROSCOPY;  Surgeon: Twylla Glendia BROCKS, MD;  Location: [facility];  Service: Urology;  Laterality: Bilateral;   CYSTOSCOPY/URETEROSCOPY/HOLMIUM LASER/STENT PLACEMENT Right 12/22/2019   Procedure: CYSTOSCOPY/URETEROSCOPY/HOLMIUM LASER/STENT PLACEMENT;  Surgeon: Twylla Glendia BROCKS, MD;  Location: [facility];  Service: Urology;  Laterality: Right;   CYSTOSCOPY/URETEROSCOPY/HOLMIUM LASER/STENT PLACEMENT Bilateral 12/08/2021   Procedure: CYSTOSCOPY/URETEROSCOPY/HOLMIUM LASER/STENT PLACEMENT;  Surgeon: Francisca Redell BROCKS, MD;  Location: [facility];  Service: Urology;  Laterality: Bilateral;   DIALYSIS/PERMA CATHETER INSERTION  N/A 06/29/2021   Procedure: DIALYSIS/PERMA CATHETER INSERTION;  Surgeon: Marea Selinda RAMAN, MD;  Location: [facility];  Service: Cardiovascular;  Laterality: N/A;   DIALYSIS/PERMA CATHETER INSERTION N/A 07/13/2021   Procedure: DIALYSIS/PERMA CATHETER INSERTION;  Surgeon: Marea Selinda RAMAN, MD;  Location: [facility];  Service: Cardiovascular;  Laterality: N/A;   DIALYSIS/PERMA CATHETER INSERTION N/A 08/01/2023   Procedure: DIALYSIS/PERMA CATHETER INSERTION;  Surgeon: Marea Selinda RAMAN, MD;  Location: [facility];  Service: Cardiovascular;  Laterality: N/A;   DIALYSIS/PERMA CATHETER REMOVAL N/A 06/18/2024   Procedure: DIALYSIS/PERMA CATHETER REMOVAL;  Surgeon: Marea Selinda RAMAN, MD;  Location: [facility];  Service: Cardiovascular;  Laterality: N/A;   EYE SURGERY     INCISIONAL HERNIA REPAIR  06/28/2023   IR GASTROSTOMY TUBE MOD SED  08/01/2021   LAPAROTOMY N/A 06/29/2021   Procedure: EXPLORATORY LAPAROTOMY WITH REPAIR OF DUODENAL PERFORATION;  Surgeon: Desiderio Schanz, MD;  Location: [facility];  Service: General;  Laterality: N/A;   POLYPECTOMY  08/19/2015   Procedure: POLYPECTOMY;  Surgeon: Rogelia Copping, MD;  Location: [facility];  Service: Endoscopy;;   TUBAL LIGATION  1992    Family History  Problem Relation Age of Onset   Diabetes Father    Cancer Father        lung cancer   Diabetes Brother    Stroke Mother     Allergies  Allergen Reactions   Contrast Media  [Iodinated Contrast Media] Anaphylaxis   Iodine  Swelling    (IV only) - angioedema       Latest Ref Rng & Units 09/30/2024    9:01 AM 09/29/2024    5:00 AM 09/28/2024   12:11 PM  CBC  WBC 4.0 - 10.5 K/uL 9.7  14.8  14.8   Hemoglobin 12.0 - 15.0 g/dL 8.9  89.2  89.8   Hematocrit 36.0 - 46.0 % 26.5  31.5  30.0   Platelets 150 - 400 K/uL 239  234  233       CMP     Component Value Date/Time   NA 130 (L) 09/30/2024 0901   NA 138 10/03/2022 1350   NA 133 (L) 01/29/2014 0406   K 4.0  09/30/2024 0901   K 4.0 01/29/2014 0406   CL 92 (L) 09/30/2024 0901   CL 96 (L) 01/29/2014 0406   CO2 23 09/30/2024 0901   CO2 29 01/29/2014 0406   GLUCOSE 279 (H) 09/30/2024 0901   GLUCOSE 377 (H) 01/29/2014 0406   BUN 44 (H) 09/30/2024 0901   BUN 39 (H) 10/03/2022 1350   BUN 31 (H) 01/29/2014 0406   CREATININE 6.62 (H) 09/30/2024 0901   CREATININE 1.17 (H) 03/17/2018 0830   CALCIUM  8.6 (L) 09/30/2024 0901   CALCIUM  9.7 01/29/2014 0406   PROT 7.0 09/28/2024 1310   PROT 6.9 02/10/2022 0939   PROT 8.1 01/27/2014 1318   ALBUMIN  3.0 (L) 09/28/2024 1310   ALBUMIN  3.8 02/10/2022 0939   ALBUMIN  4.1 01/27/2014 1318   AST 28 09/28/2024 1310   AST 36 01/27/2014 1318   ALT 20 09/28/2024 1310   ALT 30 01/27/2014 1318   ALKPHOS 112 09/28/2024 1310   ALKPHOS 110 01/27/2014 1318   BILITOT 0.5 09/28/2024 1310   BILITOT <0.2 02/10/2022 0939   BILITOT 0.5 01/27/2014 1318   EGFR 18 (L) 10/03/2022 1350   GFRNONAA 6 (L) 09/30/2024 0901   GFRNONAA 48 (L) 08/21/2016 0908     No results found.     Assessment & Plan:   1. ESRD (end stage renal disease) (HCC) (Primary) Recommend:  The patient is doing well and currently has adequate dialysis access.  Although there are some parameters suggesting possible future issues.  The patient's dialysis center is not reporting any major access issues.  However, the flow rates in the patient's dialysis access are low, in the prethrombotic range, and there is no exact stenosis identified.  This raises concerns that the access is at moderate but not high risk for a problem or thrombosis and should be followed more closely  The patient will follow-up with me in the office in 3 months.  The need for a follow up duplex ultrasound will be made at that time based on whether problems with the access are persistent.    2. Type 2 diabetes mellitus with chronic kidney disease on chronic dialysis, with long-term current use of insulin  (HCC) Continue hypoglycemic  medications as already ordered, these medications have been reviewed and there are no changes at this time.  Hgb A1C to be monitored as already arranged by primary service  3. Essential hypertension Continue antihypertensive medications as already ordered, these medications have been reviewed and there are no changes at this time.   Assessment and Plan Assessment & Plan      Current Outpatient Medications on File Prior to Visit  Medication Sig Dispense Refill   aspirin  81 MG EC tablet Take 81 mg by mouth daily.     atorvastatin  (LIPITOR) 20 MG tablet Take 20 mg by mouth at bedtime.     buPROPion  (WELLBUTRIN  XL) 150 MG 24 hr tablet Take 150 mg by mouth every morning.     busPIRone  (BUSPAR ) 5 MG tablet Take 5 mg by mouth every morning.     cloNIDine  (CATAPRES ) 0.1 MG tablet Take 1 tablet (0.1 mg total) by mouth 2 (two) times daily. 60 tablet 11   Dulaglutide  3 MG/0.5ML SOAJ Inject 3 mg into the skin once a week. Sundays     Insulin  Aspart FlexPen (NOVOLOG ) 100 UNIT/ML Inject 14 Units into the skin 2 (two) times daily.     insulin  glargine (LANTUS ) 100 UNIT/ML injection Inject 0.15 mLs (15 Units total) into the skin at bedtime. 10 mL 11   loperamide  (IMODIUM  A-D) 2 MG tablet Take 2 mg by mouth every 6 (six) hours as needed for diarrhea or loose stools.     metoprolol  tartrate (LOPRESSOR ) 100 MG tablet Take 1 tablet (100 mg total) by mouth 2 (two) times daily. 60 tablet 2   Multiple Vitamin (MULTIVITAMIN WITH MINERALS) TABS tablet Take 1 tablet by mouth daily. 30 tablet 3   sevelamer  carbonate (RENVELA ) 800 MG tablet Take 1,600 mg by mouth 3 (three) times daily.     No current facility-administered medications on file prior to visit.    There are no Patient Instructions on file for this visit. No follow-ups on file.   Malcom Selmer E Deyton Ellenbecker, NP

## 2024-11-18 ENCOUNTER — Encounter: Payer: Self-pay | Admitting: Internal Medicine

## 2024-11-25 ENCOUNTER — Ambulatory Visit (INDEPENDENT_AMBULATORY_CARE_PROVIDER_SITE_OTHER): Admitting: Urology

## 2024-11-25 ENCOUNTER — Encounter: Payer: Self-pay | Admitting: Internal Medicine

## 2024-11-25 VITALS — BP 146/76 | HR 78 | Wt 210.0 lb

## 2024-11-25 DIAGNOSIS — N200 Calculus of kidney: Secondary | ICD-10-CM

## 2024-11-25 DIAGNOSIS — N390 Urinary tract infection, site not specified: Secondary | ICD-10-CM

## 2024-11-25 DIAGNOSIS — N3281 Overactive bladder: Secondary | ICD-10-CM | POA: Diagnosis not present

## 2024-11-25 LAB — MICROSCOPIC EXAMINATION
RBC, Urine: >30 /HPF — AB (ref 0–2)
WBC, UA: >30 /HPF — AB (ref 0–5)

## 2024-11-25 LAB — URINALYSIS, COMPLETE
Bilirubin, UA: NEGATIVE
Glucose, UA: NEGATIVE
Ketones, UA: NEGATIVE
Nitrite, UA: NEGATIVE
Specific Gravity, UA: 1.02 (ref 1.005–1.030)
Urobilinogen, Ur: 0.2 mg/dL (ref 0.2–1.0)
pH, UA: 7.5 (ref 5.0–7.5)

## 2024-11-25 NOTE — Progress Notes (Signed)
 "  11/25/2024 12:31 PM   Heather Lopez Baptist 05-10-1946 [id number]  Referring provider: Franchot Houston, PA-C [address] [place],  [place] [address]  Chief Complaint  Patient presents with   Follow-up    HPI: Heather Lopez is a 78 y.o. female who presents for follow-up visit.  Refer to exam by the courts prior note 10/14/2024 for a recent clinical summary PVR at that visit was 0 mL She has no complaints today and states she is doing well.  No bothersome lower urinary tract symptoms Denies flank, abdominal or pelvic pain   PMH: Past Medical History:  Diagnosis Date   Acute renal failure 06/27/2021   Anemia in chronic kidney disease    Anxiety    Aortic atherosclerosis    Atrial fibrillation (HCC)    in 2022 while in hospital for sepsis   Cataract    Depression    Dysrhythmia    ESRD on dialysis (HCC)    M, W, F   GERD (gastroesophageal reflux disease)    Gout    History of kidney stones    Hydronephrosis of right kidney    Hyperlipidemia    Hypertension, essential    Nausea & vomiting 12/02/2021   Obesity    Obstruction of left ureteropelvic junction (UPJ) due to stone 06/2021   Osteoarthritis    Perforated duodenal ulcer (HCC) 06/27/2021   Pyonephrosis 06/2021   left   Septic shock (HCC) 06/27/2021   Spontaneous bacterial peritonitis (HCC)    Subdural hematoma (HCC) 08/2023   Type 2 diabetes mellitus with stage 5 chronic kidney disease (HCC)    Vertigo     Surgical History: Past Surgical History:  Procedure Laterality Date   A/V FISTULAGRAM Left 04/23/2024   Procedure: A/V Fistulagram;  Surgeon: Marea Selinda RAMAN, MD;  Location: [facility];  Service: Cardiovascular;  Laterality: Left;   A/V FISTULAGRAM Left 09/17/2024   Procedure: A/V Fistulagram;  Surgeon: Marea Selinda RAMAN, MD;  Location: [facility];  Service: Cardiovascular;  Laterality: Left;   AV FISTULA PLACEMENT Left 01/09/2024   Procedure: ARTERIOVENOUS (AV) FISTULA CREATION  (BRACHIAL CEPHALIC);  Surgeon: Marea Selinda RAMAN, MD;  Location: [facility];  Service: Vascular;  Laterality: Left;   CAPD REMOVAL N/A 08/02/2023   Procedure: CONTINUOUS AMBULATORY PERITONEAL DIALYSIS  (CAPD) CATHETER REMOVAL;  Surgeon: Jordis Laneta FALCON, MD;  Location: [facility];  Service: General;  Laterality: N/A;   CATARACT EXTRACTION, BILATERAL Bilateral 2019   COLONOSCOPY WITH PROPOFOL  N/A 08/19/2015   Procedure: COLONOSCOPY WITH PROPOFOL ;  Surgeon: Rogelia Copping, MD;  Location: [facility];  Service: Endoscopy;  Laterality: N/A;  diabetic - insulin    CYSTOSCOPY W/ RETROGRADES Bilateral 12/22/2019   Procedure: CYSTOSCOPY WITH RETROGRADE PYELOGRAM;  Surgeon: Twylla Glendia BROCKS, MD;  Location: [facility];  Service: Urology;  Laterality: Bilateral;   CYSTOSCOPY W/ URETERAL STENT PLACEMENT Bilateral 07/05/2021   Procedure: CYSTOSCOPY WITH RETROGRADE PYELOGRAM/BILATERAL URETERAL STENT PLACEMENT;  Surgeon: Twylla Glendia BROCKS, MD;  Location: [facility];  Service: Urology;  Laterality: Bilateral;   CYSTOSCOPY W/ URETERAL STENT PLACEMENT Bilateral 12/26/2021   Procedure: CYSTOSCOPY WITH STENT REMOVAL;  Surgeon: Twylla Glendia BROCKS, MD;  Location: [facility];  Service: Urology;  Laterality: Bilateral;   CYSTOSCOPY W/ URETERAL STENT PLACEMENT Right 03/13/2022   Procedure: CYSTOSCOPY WITH RETROGRADE PYELOGRAM/URETERAL STENT PLACEMENT;  Surgeon: Twylla Glendia BROCKS, MD;  Location: [facility];  Service: Urology;  Laterality: Right;   CYSTOSCOPY W/ URETERAL STENT PLACEMENT Right 01/01/2023  Procedure: CYSTOSCOPY WITH STENT EXCHANGE;  Surgeon: Twylla Glendia BROCKS, MD;  Location: [facility];  Service: Urology;  Laterality: Right;   CYSTOSCOPY W/ URETERAL STENT REMOVAL Right 05/08/2022   Procedure: CYSTOSCOPY WITH STENT REMOVAL;  Surgeon: Twylla Glendia BROCKS, MD;  Location: [facility];  Service: Urology;  Laterality: Right;   CYSTOSCOPY W/ URETERAL STENT REMOVAL Right 07/16/2024   Procedure: REMOVAL, STENT, URETER, CYSTOSCOPIC;  Surgeon:  Twylla Glendia BROCKS, MD;  Location: [facility];  Service: Urology;  Laterality: Right;   CYSTOSCOPY WITH BIOPSY N/A 12/22/2019   Procedure: CYSTOSCOPY WITH bladder BIOPSY;  Surgeon: Twylla Glendia BROCKS, MD;  Location: [facility];  Service: Urology;  Laterality: N/A;   CYSTOSCOPY WITH BIOPSY  03/13/2022   Procedure: CYSTOSCOPY WITH BLADDER BIOPSY;  Surgeon: Twylla Glendia BROCKS, MD;  Location: [facility];  Service: Urology;;   CYSTOSCOPY WITH STENT PLACEMENT Right 12/22/2019   Procedure: CYSTOSCOPY WITH STENT PLACEMENT;  Surgeon: Twylla Glendia BROCKS, MD;  Location: [facility];  Service: Urology;  Laterality: Right;   CYSTOSCOPY WITH STENT PLACEMENT Right 07/03/2022   Procedure: CYSTOSCOPY/RIGHT URETEROSCOPY WITH STENT PLACEMENT;  Surgeon: Twylla Glendia BROCKS, MD;  Location: [facility];  Service: Urology;  Laterality: Right;   CYSTOSCOPY WITH URETEROSCOPY Bilateral 12/22/2019   Procedure: CYSTOSCOPY WITH URETEROSCOPY;  Surgeon: Twylla Glendia BROCKS, MD;  Location: [facility];  Service: Urology;  Laterality: Bilateral;   CYSTOSCOPY/URETEROSCOPY/HOLMIUM LASER/STENT PLACEMENT Right 12/22/2019   Procedure: CYSTOSCOPY/URETEROSCOPY/HOLMIUM LASER/STENT PLACEMENT;  Surgeon: Twylla Glendia BROCKS, MD;  Location: [facility];  Service: Urology;  Laterality: Right;   CYSTOSCOPY/URETEROSCOPY/HOLMIUM LASER/STENT PLACEMENT Bilateral 12/08/2021   Procedure: CYSTOSCOPY/URETEROSCOPY/HOLMIUM LASER/STENT PLACEMENT;  Surgeon: Francisca Redell BROCKS, MD;  Location: [facility];  Service: Urology;  Laterality: Bilateral;   DIALYSIS/PERMA CATHETER INSERTION N/A 06/29/2021   Procedure: DIALYSIS/PERMA CATHETER INSERTION;  Surgeon: Marea Selinda RAMAN, MD;  Location: [facility];  Service: Cardiovascular;  Laterality: N/A;   DIALYSIS/PERMA CATHETER INSERTION N/A 07/13/2021   Procedure: DIALYSIS/PERMA CATHETER INSERTION;  Surgeon: Marea Selinda RAMAN, MD;  Location: [facility];  Service: Cardiovascular;  Laterality: N/A;   DIALYSIS/PERMA CATHETER INSERTION N/A  08/01/2023   Procedure: DIALYSIS/PERMA CATHETER INSERTION;  Surgeon: Marea Selinda RAMAN, MD;  Location: [facility];  Service: Cardiovascular;  Laterality: N/A;   DIALYSIS/PERMA CATHETER REMOVAL N/A 06/18/2024   Procedure: DIALYSIS/PERMA CATHETER REMOVAL;  Surgeon: Marea Selinda RAMAN, MD;  Location: [facility];  Service: Cardiovascular;  Laterality: N/A;   EYE SURGERY     INCISIONAL HERNIA REPAIR  06/28/2023   IR GASTROSTOMY TUBE MOD SED  08/01/2021   LAPAROTOMY N/A 06/29/2021   Procedure: EXPLORATORY LAPAROTOMY WITH REPAIR OF DUODENAL PERFORATION;  Surgeon: Desiderio Schanz, MD;  Location: [facility];  Service: General;  Laterality: N/A;   POLYPECTOMY  08/19/2015   Procedure: POLYPECTOMY;  Surgeon: Rogelia Copping, MD;  Location: [facility];  Service: Endoscopy;;   TUBAL LIGATION  1992    Home Medications:  Allergies as of 11/25/2024       Reactions   Contrast Media  [iodinated Contrast Media] Anaphylaxis   Iodine  Swelling   (IV only) - angioedema        Medication List        Accurate as of November 25, 2024 12:31 PM. If you have any questions, ask your nurse or doctor.          aspirin  EC 81 MG tablet Take 81 mg by mouth daily.   atorvastatin  20 MG tablet Commonly known as: LIPITOR Take 20  mg by mouth at bedtime.   buPROPion  150 MG 24 hr tablet Commonly known as: WELLBUTRIN  XL Take 150 mg by mouth every morning.   busPIRone  5 MG tablet Commonly known as: BUSPAR  Take 5 mg by mouth every morning.   cloNIDine  0.1 MG tablet Commonly known as: CATAPRES  Take 1 tablet (0.1 mg total) by mouth 2 (two) times daily.   Dulaglutide  3 MG/0.5ML Soaj Inject 3 mg into the skin once a week. Sundays   Insulin  Aspart FlexPen 100 UNIT/ML Commonly known as: NOVOLOG  Inject 14 Units into the skin 2 (two) times daily.   insulin  glargine 100 UNIT/ML injection Commonly known as: LANTUS  Inject 0.15 mLs (15 Units total) into the skin at bedtime.   loperamide  2 MG  tablet Commonly known as: IMODIUM  A-D Take 2 mg by mouth every 6 (six) hours as needed for diarrhea or loose stools.   metoprolol  tartrate 100 MG tablet Commonly known as: LOPRESSOR  Take 1 tablet (100 mg total) by mouth 2 (two) times daily.   multivitamin with minerals Tabs tablet Take 1 tablet by mouth daily.   sevelamer  carbonate 800 MG tablet Commonly known as: RENVELA  Take 1,600 mg by mouth 3 (three) times daily.        Allergies: Allergies[1]  Family History: Family History  Problem Relation Age of Onset   Diabetes Father    Cancer Father        lung cancer   Diabetes Brother    Stroke Mother     Social History:  reports that she quit smoking about 53 years ago. Her smoking use included cigarettes. She started smoking about 59 years ago. She has a 4.5 pack-year smoking history. She has never used smokeless tobacco. She reports that she does not drink alcohol and does not use drugs.   Physical Exam: BP (!) 146/76   Pulse 78   Wt 210 lb (95.3 kg)   SpO2 96%   BMI 37.20 kg/m   Constitutional:  Alert, No acute distress. HEENT: [employer] AT Respiratory: Normal respiratory effort, no increased work of breathing.  Psychiatric: Normal mood and affect.  Laboratory Data:  Urinalysis Pending   Assessment & Plan:    1. Recurrent UTI  asymptomatic  2. OAB  ESRD/oliguric   Return in about 1 year (around 11/25/2025).    Glendia JAYSON Barba, MD  [facility] [address] [place], [place] [address] [phone]     [1]  Allergies Allergen Reactions   Contrast Media  [Iodinated Contrast Media] Anaphylaxis   Iodine  Swelling    (IV only) - angioedema   "

## 2024-11-26 ENCOUNTER — Emergency Department

## 2024-11-26 ENCOUNTER — Other Ambulatory Visit: Payer: Self-pay

## 2024-11-26 ENCOUNTER — Inpatient Hospital Stay
Admission: EM | Admit: 2024-11-26 | Discharge: 2024-11-29 | DRG: 871 | Disposition: A | Discharge status: Home / Self Care | Attending: Internal Medicine | Admitting: Internal Medicine

## 2024-11-26 DIAGNOSIS — N898 Other specified noninflammatory disorders of vagina: Secondary | ICD-10-CM | POA: Diagnosis not present

## 2024-11-26 DIAGNOSIS — A4159 Other Gram-negative sepsis: Principal | ICD-10-CM | POA: Diagnosis present

## 2024-11-26 DIAGNOSIS — N390 Urinary tract infection, site not specified: Secondary | ICD-10-CM

## 2024-11-26 DIAGNOSIS — D631 Anemia in chronic kidney disease: Secondary | ICD-10-CM | POA: Diagnosis present

## 2024-11-26 DIAGNOSIS — Z79899 Other long term (current) drug therapy: Secondary | ICD-10-CM

## 2024-11-26 DIAGNOSIS — Z91041 Radiographic dye allergy status: Secondary | ICD-10-CM

## 2024-11-26 DIAGNOSIS — R531 Weakness: Secondary | ICD-10-CM

## 2024-11-26 DIAGNOSIS — A419 Sepsis, unspecified organism: Secondary | ICD-10-CM | POA: Diagnosis not present

## 2024-11-26 DIAGNOSIS — E119 Type 2 diabetes mellitus without complications: Secondary | ICD-10-CM

## 2024-11-26 DIAGNOSIS — R509 Fever, unspecified: Principal | ICD-10-CM

## 2024-11-26 DIAGNOSIS — E1122 Type 2 diabetes mellitus with diabetic chronic kidney disease: Secondary | ICD-10-CM | POA: Diagnosis present

## 2024-11-26 DIAGNOSIS — Z7982 Long term (current) use of aspirin: Secondary | ICD-10-CM

## 2024-11-26 DIAGNOSIS — Z823 Family history of stroke: Secondary | ICD-10-CM

## 2024-11-26 DIAGNOSIS — N3001 Acute cystitis with hematuria: Secondary | ICD-10-CM | POA: Diagnosis present

## 2024-11-26 DIAGNOSIS — I120 Hypertensive chronic kidney disease with stage 5 chronic kidney disease or end stage renal disease: Secondary | ICD-10-CM | POA: Diagnosis present

## 2024-11-26 DIAGNOSIS — Z833 Family history of diabetes mellitus: Secondary | ICD-10-CM

## 2024-11-26 DIAGNOSIS — M109 Gout, unspecified: Secondary | ICD-10-CM | POA: Diagnosis present

## 2024-11-26 DIAGNOSIS — N200 Calculus of kidney: Secondary | ICD-10-CM

## 2024-11-26 DIAGNOSIS — K219 Gastro-esophageal reflux disease without esophagitis: Secondary | ICD-10-CM | POA: Diagnosis present

## 2024-11-26 DIAGNOSIS — Z992 Dependence on renal dialysis: Secondary | ICD-10-CM

## 2024-11-26 DIAGNOSIS — Z960 Presence of urogenital implants: Secondary | ICD-10-CM

## 2024-11-26 DIAGNOSIS — F419 Anxiety disorder, unspecified: Secondary | ICD-10-CM | POA: Diagnosis present

## 2024-11-26 DIAGNOSIS — Z1152 Encounter for screening for COVID-19: Secondary | ICD-10-CM

## 2024-11-26 DIAGNOSIS — Z87891 Personal history of nicotine dependence: Secondary | ICD-10-CM

## 2024-11-26 DIAGNOSIS — E872 Acidosis, unspecified: Secondary | ICD-10-CM | POA: Diagnosis present

## 2024-11-26 DIAGNOSIS — Z801 Family history of malignant neoplasm of trachea, bronchus and lung: Secondary | ICD-10-CM

## 2024-11-26 DIAGNOSIS — Z87442 Personal history of urinary calculi: Secondary | ICD-10-CM

## 2024-11-26 DIAGNOSIS — I10 Essential (primary) hypertension: Secondary | ICD-10-CM | POA: Diagnosis present

## 2024-11-26 DIAGNOSIS — N186 End stage renal disease: Secondary | ICD-10-CM | POA: Diagnosis present

## 2024-11-26 DIAGNOSIS — E785 Hyperlipidemia, unspecified: Secondary | ICD-10-CM | POA: Diagnosis present

## 2024-11-26 DIAGNOSIS — F32A Depression, unspecified: Secondary | ICD-10-CM | POA: Diagnosis present

## 2024-11-26 DIAGNOSIS — Z794 Long term (current) use of insulin: Secondary | ICD-10-CM

## 2024-11-26 LAB — COMPREHENSIVE METABOLIC PANEL WITH GFR
ALT: 13 U/L (ref 0–44)
AST: 17 U/L (ref 15–41)
Albumin: 3.5 g/dL (ref 3.5–5.0)
Alkaline Phosphatase: 143 U/L — ABNORMAL HIGH (ref 38–126)
Anion gap: 14 (ref 5–15)
BUN: 14 mg/dL (ref 8–23)
CO2: 29 mmol/L (ref 22–32)
Calcium: 9.1 mg/dL (ref 8.9–10.3)
Chloride: 94 mmol/L — ABNORMAL LOW (ref 98–111)
Creatinine, Ser: 3.55 mg/dL — ABNORMAL HIGH (ref 0.44–1.00)
GFR, Estimated: 13 mL/min — ABNORMAL LOW
Glucose, Bld: 189 mg/dL — ABNORMAL HIGH (ref 70–99)
Potassium: 4.1 mmol/L (ref 3.5–5.1)
Sodium: 137 mmol/L (ref 135–145)
Total Bilirubin: 0.3 mg/dL (ref 0.0–1.2)
Total Protein: 7.4 g/dL (ref 6.5–8.1)

## 2024-11-26 LAB — LIPASE, BLOOD: Lipase: 51 U/L (ref 11–51)

## 2024-11-26 LAB — CBC WITH DIFFERENTIAL/PLATELET
Abs Immature Granulocytes: 0.07 K/uL (ref 0.00–0.07)
Basophils Absolute: 0 K/uL (ref 0.0–0.1)
Basophils Relative: 0 %
Eosinophils Absolute: 0 K/uL (ref 0.0–0.5)
Eosinophils Relative: 0 %
HCT: 28.6 % — ABNORMAL LOW (ref 36.0–46.0)
Hemoglobin: 9.2 g/dL — ABNORMAL LOW (ref 12.0–15.0)
Immature Granulocytes: 1 %
Lymphocytes Relative: 15 %
Lymphs Abs: 1.5 K/uL (ref 0.7–4.0)
MCH: 33.5 pg (ref 26.0–34.0)
MCHC: 32.2 g/dL (ref 30.0–36.0)
MCV: 104 fL — ABNORMAL HIGH (ref 80.0–100.0)
Monocytes Absolute: 0.6 K/uL (ref 0.1–1.0)
Monocytes Relative: 6 %
Neutro Abs: 8 K/uL — ABNORMAL HIGH (ref 1.7–7.7)
Neutrophils Relative %: 78 %
Platelets: 319 K/uL (ref 150–400)
RBC: 2.75 MIL/uL — ABNORMAL LOW (ref 3.87–5.11)
RDW: 16.6 % — ABNORMAL HIGH (ref 11.5–15.5)
WBC: 10.2 K/uL (ref 4.0–10.5)
nRBC: 0 % (ref 0.0–0.2)

## 2024-11-26 LAB — URINALYSIS, COMPLETE (UACMP) WITH MICROSCOPIC
Bacteria, UA: NONE SEEN
Bilirubin Urine: NEGATIVE
Glucose, UA: NEGATIVE mg/dL
Ketones, ur: NEGATIVE mg/dL
Nitrite: NEGATIVE
Protein, ur: 30 mg/dL — AB
Specific Gravity, Urine: 1.005 (ref 1.005–1.030)
Squamous Epithelial / HPF: 0 /HPF (ref 0–5)
WBC, UA: >50 WBC/hpf (ref 0–5)
pH: 7 (ref 5.0–8.0)

## 2024-11-26 LAB — BLOOD GAS, VENOUS

## 2024-11-26 LAB — RESP PANEL BY RT-PCR (RSV, FLU A&B, COVID)  RVPGX2
Influenza A by PCR: NEGATIVE
Influenza B by PCR: NEGATIVE
Resp Syncytial Virus by PCR: NEGATIVE
SARS Coronavirus 2 by RT PCR: NEGATIVE

## 2024-11-26 LAB — LACTIC ACID, PLASMA: Lactic Acid, Venous: 2.3 mmol/L (ref 0.5–1.9)

## 2024-11-26 LAB — PROCALCITONIN: Procalcitonin: 0.58 ng/mL

## 2024-11-26 MED ORDER — SODIUM CHLORIDE 0.9 % IV SOLN
1.0000 g | Freq: Once | INTRAVENOUS | Status: AC
  Administered 2024-11-26: 1 g via INTRAVENOUS
  Filled 2024-11-26: qty 10

## 2024-11-26 MED ORDER — ACETAMINOPHEN 325 MG PO TABS
975.0000 mg | ORAL_TABLET | Freq: Once | ORAL | Status: AC
  Administered 2024-11-26: 975 mg via ORAL
  Filled 2024-11-26: qty 3

## 2024-11-26 NOTE — Hospital Course (Signed)
 Heather Lopez

## 2024-11-26 NOTE — H&P (Signed)
 " History and Physical    Patient: Heather Lopez:[id number] DOB: 01/07/1946 DOA: 11/26/2024 DOS: the patient was seen and examined on 11/26/2024 PCP: Franchot Houston, PA-C  Patient coming from: Home  Chief Complaint:  Chief Complaint  Patient presents with   Chills    HPI: Heather Lopez is a 79 y.o. female with medical history significant for  ESRD only on HD TTS, HTN, , IDDM, anxiety/depression, nephrolithiasis with prior ureteral stents 2023, history of admission in November for E. coli pyelonephritis with sepsis and urinary retention that required Foley for a week, seen by urology yesterday 11/25/2024 when she was doing well (note reviewed), being admitted with sepsis secondary to UTI.  She presents with fever and chills starting on the evening of arrival.  The day prior, according to her husband who gives the history, she felt weak and saw blood in her urine.  UA done at dialysis earlier in the day was consistent with UTI and she was given a dose of Rocephin , however due to development of shaking chills upon her arrival home from dialysis, husband brought her to the ED.   In the ED febrile to 103.2, tachycardic to 100 and tachypneic to 29.  BP 156/50 with O2 sat 93% on room air. Labs notable for WBC of 10 and lactic acid 2.3.  UA showed large leuks and was turbid in appearance.  CBC and CMP otherwise at baseline. VBG with pH 7.5 and pCO2 43.  Respiratory viral panel negative. EKG showed sinus at 98 Chest x-ray showed bronchovascular crowding without infiltrate. Patient treated with Rocephin  and Tylenol  Admission requested   Past Medical History:  Diagnosis Date   Acute renal failure 06/27/2021   Anemia in chronic kidney disease    Anxiety    Aortic atherosclerosis    Atrial fibrillation (HCC)    in 2022 while in hospital for sepsis   Cataract    Depression    Dysrhythmia    ESRD on dialysis (HCC)    M, W, F   GERD (gastroesophageal reflux disease)    Gout    History of kidney  stones    Hydronephrosis of right kidney    Hyperlipidemia    Hypertension, essential    Nausea & vomiting 12/02/2021   Obesity    Obstruction of left ureteropelvic junction (UPJ) due to stone 06/2021   Osteoarthritis    Perforated duodenal ulcer (HCC) 06/27/2021   Pyonephrosis 06/2021   left   Septic shock (HCC) 06/27/2021   Spontaneous bacterial peritonitis (HCC)    Subdural hematoma (HCC) 08/2023   Type 2 diabetes mellitus with stage 5 chronic kidney disease (HCC)    Vertigo    Past Surgical History:  Procedure Laterality Date   A/V FISTULAGRAM Left 04/23/2024   Procedure: A/V Fistulagram;  Surgeon: Marea Selinda RAMAN, MD;  Location: [facility];  Service: Cardiovascular;  Laterality: Left;   A/V FISTULAGRAM Left 09/17/2024   Procedure: A/V Fistulagram;  Surgeon: Marea Selinda RAMAN, MD;  Location: [facility];  Service: Cardiovascular;  Laterality: Left;   AV FISTULA PLACEMENT Left 01/09/2024   Procedure: ARTERIOVENOUS (AV) FISTULA CREATION (BRACHIAL CEPHALIC);  Surgeon: Marea Selinda RAMAN, MD;  Location: [facility];  Service: Vascular;  Laterality: Left;   CAPD REMOVAL N/A 08/02/2023   Procedure: CONTINUOUS AMBULATORY PERITONEAL DIALYSIS  (CAPD) CATHETER REMOVAL;  Surgeon: Jordis Laneta FALCON, MD;  Location: [facility];  Service: General;  Laterality: N/A;   CATARACT EXTRACTION, BILATERAL Bilateral 2019  COLONOSCOPY WITH PROPOFOL  N/A 08/19/2015   Procedure: COLONOSCOPY WITH PROPOFOL ;  Surgeon: Rogelia Copping, MD;  Location: [facility];  Service: Endoscopy;  Laterality: N/A;  diabetic - insulin    CYSTOSCOPY W/ RETROGRADES Bilateral 12/22/2019   Procedure: CYSTOSCOPY WITH RETROGRADE PYELOGRAM;  Surgeon: Twylla Glendia BROCKS, MD;  Location: [facility];  Service: Urology;  Laterality: Bilateral;   CYSTOSCOPY W/ URETERAL STENT PLACEMENT Bilateral 07/05/2021   Procedure: CYSTOSCOPY WITH RETROGRADE PYELOGRAM/BILATERAL URETERAL STENT PLACEMENT;  Surgeon: Twylla Glendia BROCKS, MD;  Location: [facility];   Service: Urology;  Laterality: Bilateral;   CYSTOSCOPY W/ URETERAL STENT PLACEMENT Bilateral 12/26/2021   Procedure: CYSTOSCOPY WITH STENT REMOVAL;  Surgeon: Twylla Glendia BROCKS, MD;  Location: [facility];  Service: Urology;  Laterality: Bilateral;   CYSTOSCOPY W/ URETERAL STENT PLACEMENT Right 03/13/2022   Procedure: CYSTOSCOPY WITH RETROGRADE PYELOGRAM/URETERAL STENT PLACEMENT;  Surgeon: Twylla Glendia BROCKS, MD;  Location: [facility];  Service: Urology;  Laterality: Right;   CYSTOSCOPY W/ URETERAL STENT PLACEMENT Right 01/01/2023   Procedure: CYSTOSCOPY WITH STENT EXCHANGE;  Surgeon: Twylla Glendia BROCKS, MD;  Location: [facility];  Service: Urology;  Laterality: Right;   CYSTOSCOPY W/ URETERAL STENT REMOVAL Right 05/08/2022   Procedure: CYSTOSCOPY WITH STENT REMOVAL;  Surgeon: Twylla Glendia BROCKS, MD;  Location: [facility];  Service: Urology;  Laterality: Right;   CYSTOSCOPY W/ URETERAL STENT REMOVAL Right 07/16/2024   Procedure: REMOVAL, STENT, URETER, CYSTOSCOPIC;  Surgeon: Twylla Glendia BROCKS, MD;  Location: [facility];  Service: Urology;  Laterality: Right;   CYSTOSCOPY WITH BIOPSY N/A 12/22/2019   Procedure: CYSTOSCOPY WITH bladder BIOPSY;  Surgeon: Twylla Glendia BROCKS, MD;  Location: [facility];  Service: Urology;  Laterality: N/A;   CYSTOSCOPY WITH BIOPSY  03/13/2022   Procedure: CYSTOSCOPY WITH BLADDER BIOPSY;  Surgeon: Twylla Glendia BROCKS, MD;  Location: [facility];  Service: Urology;;   CYSTOSCOPY WITH STENT PLACEMENT Right 12/22/2019   Procedure: CYSTOSCOPY WITH STENT PLACEMENT;  Surgeon: Twylla Glendia BROCKS, MD;  Location: [facility];  Service: Urology;  Laterality: Right;   CYSTOSCOPY WITH STENT PLACEMENT Right 07/03/2022   Procedure: CYSTOSCOPY/RIGHT URETEROSCOPY WITH STENT PLACEMENT;  Surgeon: Twylla Glendia BROCKS, MD;  Location: [facility];  Service: Urology;  Laterality: Right;   CYSTOSCOPY WITH URETEROSCOPY Bilateral 12/22/2019   Procedure: CYSTOSCOPY WITH URETEROSCOPY;  Surgeon: Twylla Glendia BROCKS, MD;  Location: [facility];   Service: Urology;  Laterality: Bilateral;   CYSTOSCOPY/URETEROSCOPY/HOLMIUM LASER/STENT PLACEMENT Right 12/22/2019   Procedure: CYSTOSCOPY/URETEROSCOPY/HOLMIUM LASER/STENT PLACEMENT;  Surgeon: Twylla Glendia BROCKS, MD;  Location: [facility];  Service: Urology;  Laterality: Right;   CYSTOSCOPY/URETEROSCOPY/HOLMIUM LASER/STENT PLACEMENT Bilateral 12/08/2021   Procedure: CYSTOSCOPY/URETEROSCOPY/HOLMIUM LASER/STENT PLACEMENT;  Surgeon: Francisca Redell BROCKS, MD;  Location: [facility];  Service: Urology;  Laterality: Bilateral;   DIALYSIS/PERMA CATHETER INSERTION N/A 06/29/2021   Procedure: DIALYSIS/PERMA CATHETER INSERTION;  Surgeon: Marea Selinda RAMAN, MD;  Location: [facility];  Service: Cardiovascular;  Laterality: N/A;   DIALYSIS/PERMA CATHETER INSERTION N/A 07/13/2021   Procedure: DIALYSIS/PERMA CATHETER INSERTION;  Surgeon: Marea Selinda RAMAN, MD;  Location: [facility];  Service: Cardiovascular;  Laterality: N/A;   DIALYSIS/PERMA CATHETER INSERTION N/A 08/01/2023   Procedure: DIALYSIS/PERMA CATHETER INSERTION;  Surgeon: Marea Selinda RAMAN, MD;  Location: [facility];  Service: Cardiovascular;  Laterality: N/A;   DIALYSIS/PERMA CATHETER REMOVAL N/A 06/18/2024   Procedure: DIALYSIS/PERMA CATHETER REMOVAL;  Surgeon: Marea Selinda RAMAN, MD;  Location: [facility];  Service: Cardiovascular;  Laterality: N/A;   EYE SURGERY     INCISIONAL  HERNIA REPAIR  06/28/2023   IR GASTROSTOMY TUBE MOD SED  08/01/2021   LAPAROTOMY N/A 06/29/2021   Procedure: EXPLORATORY LAPAROTOMY WITH REPAIR OF DUODENAL PERFORATION;  Surgeon: Desiderio Schanz, MD;  Location: [facility];  Service: General;  Laterality: N/A;   POLYPECTOMY  08/19/2015   Procedure: POLYPECTOMY;  Surgeon: Rogelia Copping, MD;  Location: [facility];  Service: Endoscopy;;   TUBAL LIGATION  1992   Social History:  reports that she quit smoking about 53 years ago. Her smoking use included cigarettes. She started smoking about 59 years ago. She has a 4.5  pack-year smoking history. She has never used smokeless tobacco. She reports that she does not drink alcohol and does not use drugs.  Allergies[1]  Family History  Problem Relation Age of Onset   Diabetes Father    Cancer Father        lung cancer   Diabetes Brother    Stroke Mother     Prior to Admission medications  Medication Sig Start Date End Date Taking? Authorizing Provider  aspirin  81 MG EC tablet Take 81 mg by mouth daily.    [provider]  atorvastatin  (LIPITOR) 20 MG tablet Take 20 mg by mouth at bedtime.    [provider]  buPROPion  (WELLBUTRIN  XL) 150 MG 24 hr tablet Take 150 mg by mouth every morning. 06/26/24   [provider]  busPIRone  (BUSPAR ) 5 MG tablet Take 5 mg by mouth every morning.    [provider]  cloNIDine  (CATAPRES ) 0.1 MG tablet Take 1 tablet (0.1 mg total) by mouth 2 (two) times daily. 12/11/21   Patel, Sona, MD  Dulaglutide  3 MG/0.5ML SOAJ Inject 3 mg into the skin once a week. Sundays 04/02/24   [provider]  Insulin  Aspart FlexPen (NOVOLOG ) 100 UNIT/ML Inject 14 Units into the skin 2 (two) times daily. 04/06/24   [provider]  insulin  glargine (LANTUS ) 100 UNIT/ML injection Inject 0.15 mLs (15 Units total) into the skin at bedtime. 09/22/23   Agbata, Aimee, MD  loperamide  (IMODIUM  A-D) 2 MG tablet Take 2 mg by mouth every 6 (six) hours as needed for diarrhea or loose stools.    [provider]  metoprolol  tartrate (LOPRESSOR ) 100 MG tablet Take 1 tablet (100 mg total) by mouth 2 (two) times daily. 12/11/21   Patel, Sona, MD  Multiple Vitamin (MULTIVITAMIN WITH MINERALS) TABS tablet Take 1 tablet by mouth daily. 12/12/21   Patel, Sona, MD  sevelamer  carbonate (RENVELA ) 800 MG tablet Take 1,600 mg by mouth 3 (three) times daily. 04/12/24   [provider]    Physical Exam: Vitals:   11/26/24 1957 11/26/24 1958 11/26/24 2130 11/26/24 2200  BP: (!) 156/50  (!) 141/51 (!) 136/58   Pulse: 100  100 96  Resp: (!) 29  (!) 25 (!) 22  Temp: (!) 103.2 F (39.6 C)  (!) 100.6 F (38.1 C)   TempSrc: Oral  Oral   SpO2: 95%  99% 95%  Weight:  96.9 kg    Height:  5' 3 (1.6 m)     Physical Exam Vitals and nursing note reviewed.  Constitutional:      General: She is not in acute distress.    Comments: A previous week but awake and able to answer questions  HENT:     Head: Normocephalic and atraumatic.  Cardiovascular:     Rate and Rhythm: Regular rhythm. Tachycardia present.     Heart sounds: Normal heart sounds.  Pulmonary:  Effort: Tachypnea present.     Breath sounds: Normal breath sounds.  Abdominal:     Palpations: Abdomen is soft.     Tenderness: There is no abdominal tenderness.  Neurological:     Mental Status: She is alert. Mental status is at baseline.     Labs on Admission: I have personally reviewed following labs and imaging studies  CBC: Recent Labs  Lab 11/26/24 2013  WBC 10.2  NEUTROABS 8.0*  HGB 9.2*  HCT 28.6*  MCV 104.0*  PLT 319   Basic Metabolic Panel: Recent Labs  Lab 11/26/24 2013  NA 137  K 4.1  CL 94*  CO2 29  GLUCOSE 189*  BUN 14  CREATININE 3.55*  CALCIUM  9.1   GFR: Estimated Creatinine Clearance: 14.7 mL/min (A) (by C-G formula based on SCr of 3.55 mg/dL (H)). Liver Function Tests: Recent Labs  Lab 11/26/24 2013  AST 17  ALT 13  ALKPHOS 143*  BILITOT 0.3  PROT 7.4  ALBUMIN  3.5   Recent Labs  Lab 11/26/24 2013  LIPASE 51   No results for input(s): AMMONIA in the last 168 hours. Coagulation Profile: No results for input(s): INR, PROTIME in the last 168 hours. Cardiac Enzymes: No results for input(s): CKTOTAL, CKMB, CKMBINDEX, TROPONINI in the last 168 hours. BNP (last 3 results) No results for input(s): PROBNP in the last 8760 hours. HbA1C: No results for input(s): HGBA1C in the last 72 hours. CBG: No results for input(s): GLUCAP in the last 168 hours. Lipid Profile: No  results for input(s): CHOL, HDL, LDLCALC, TRIG, CHOLHDL, LDLDIRECT in the last 72 hours. Thyroid  Function Tests: No results for input(s): TSH, T4TOTAL, FREET4, T3FREE, THYROIDAB in the last 72 hours. Anemia Panel: No results for input(s): VITAMINB12, FOLATE, FERRITIN, TIBC, IRON , RETICCTPCT in the last 72 hours. Urine analysis:    Component Value Date/Time   COLORURINE YELLOW (A) 11/26/2024 2107   APPEARANCEUR TURBID (A) 11/26/2024 2107   APPEARANCEUR Cloudy (A) 11/25/2024 1059   LABSPEC 1.005 11/26/2024 2107   LABSPEC 1.024 01/27/2014 1350   PHURINE 7.0 11/26/2024 2107   GLUCOSEU NEGATIVE 11/26/2024 2107   GLUCOSEU >=500 01/27/2014 1350   HGBUR SMALL (A) 11/26/2024 2107   BILIRUBINUR NEGATIVE 11/26/2024 2107   BILIRUBINUR Negative 11/25/2024 1059   BILIRUBINUR Negative 01/27/2014 1350   KETONESUR NEGATIVE 11/26/2024 2107   PROTEINUR 30 (A) 11/26/2024 2107   UROBILINOGEN 0.2 02/10/2022 0907   NITRITE NEGATIVE 11/26/2024 2107   LEUKOCYTESUR LARGE (A) 11/26/2024 2107   LEUKOCYTESUR 2+ 01/27/2014 1350    Radiological Exams on Admission: DG Chest Portable 1 View Result Date: 11/26/2024 EXAM: 1 VIEW(S) XRAY OF THE CHEST 11/26/2024 09:26:00 PM COMPARISON: 04/23/2024 CLINICAL HISTORY: evaluate for pna FINDINGS: LINES, TUBES AND DEVICES: Right internal jugular central venous catheter removed. LUNGS AND PLEURA: Low lung volumes with bronchovascular crowding. Mild right atelectasis. Stable left basilar scarring. No pleural effusion. No pneumothorax. HEART AND MEDIASTINUM: No acute abnormality of the cardiac and mediastinal silhouettes. BONES AND SOFT TISSUES: No acute osseous abnormality. IMPRESSION: 1. No focal consolidation to suggest pneumonia; low lung volumes with bronchovascular crowding and mild right atelectasis. 2. Stable left basilar scarring. Electronically signed by: Dorethia Molt MD 11/26/2024 09:42 PM EST RP Workstation: [id number]   Data Reviewed  for HPI: Relevant notes from primary care and specialist visits, past discharge summaries as available in EHR, including Care Everywhere. Prior diagnostic testing as pertinent to current admission diagnoses Updated medications and problem lists for reconciliation ED course, including vitals, labs, imaging,  treatment and response to treatment Triage notes, nursing and pharmacy notes and ED provider's notes Notable results as noted above in HPI      Assessment and Plan: * Sepsis secondary to acute cystitis with hematuria (HCC) Nephrolithiasis with history of ureteral stent 2023 History of urinary retention requiring short-term Foley 09/2024 History of E. coli pyelonephritis with sepsis 09/2024 Sepsis criteria include fever, tachycardia and tachypnea with lactic acidosis, abnormal UA Will continue treatment with Rocephin  Sepsis fluids with close monitoring for fluid overload in view of dialysis status Antipyretics, antiemetics Patient seen by urology 11/25/2024.  Consultation not necessary at this time No concerns for urinary retention at this time  ESRD on dialysis [facility]) Nephrology consult for continuation of dialysis Patient had a full session on 11/26/2024  T2DM (type 2 diabetes mellitus) (HCC) Sliding scale insulin  coverage Continue basal insulin   Essential hypertension Will hold antihypertensives in the setting of sepsis to resume as appropriate   DVT prophylaxis: heparin   Consults: renal, Dr Dennise  Advance Care Planning:   Code Status: Prior   Family Communication: husband at bedside  Disposition Plan: Back to previous home environment  Severity of Illness: The appropriate patient status for this patient is OBSERVATION. Observation status is judged to be reasonable and necessary in order to provide the required intensity of service to ensure the patient's safety. The patient's presenting symptoms, physical exam findings, and initial radiographic and laboratory data in  the context of their medical condition is felt to place them at decreased risk for further clinical deterioration. Furthermore, it is anticipated that the patient will be medically stable for discharge from the hospital within 2 midnights of admission.   Author: Delayne LULLA Solian, MD 11/26/2024 10:53 PM  For on call review [email or web address].     [1]  Allergies Allergen Reactions   Contrast Media  [Iodinated Contrast Media] Anaphylaxis   Iodine  Swelling    (IV only) - angioedema   "

## 2024-11-26 NOTE — ED Notes (Signed)
 Labs sent to lab including a red top and blue top for future add ons.

## 2024-11-26 NOTE — ED Provider Notes (Signed)
 "  [facility] Provider Note    Event Date/Time   First MD Initiated Contact with Patient 11/26/24 1941     (approximate)   History   Chills   HPI  Heather Lopez is a 79 y.o. female  ESRD only on HD TTS, HTN, HLD, IDDM, obesity, anxiety/depression, presented with worsening of lower abdominal pain dysuria and back pain who presents with 3 hours of generalized weakness, fevers, tremors.  Patient states that she awoke this morning in her usual state of health.  She did went to dialysis today and had a full session of dialysis.  She did provide a urine sample for dialysis and when they drained that they became concerned about the possibility of infection.  They gave her a dose of ceftriaxone  prior to discharging her from their clinic.  Patient was doing well until 6 PM today when she developed rigors, nausea and a feeling of near syncope.  She did report initially having shortness of breath.  Her husband called the EMS and she was brought here.  She denies any chest pain, abdominal pain, changes in bowel habits.  Patient was recently in the hospital from 11 3-11 5 from sepsis secondary to UTI.  She did see her urologist yesterday      Physical Exam   Triage Vital Signs: ED Triage Vitals  Encounter Vitals Group     BP      Girls Systolic BP Percentile      Girls Diastolic BP Percentile      Boys Systolic BP Percentile      Boys Diastolic BP Percentile      Pulse      Resp      Temp      Temp src      SpO2      Weight      Height      Head Circumference      Peak Flow      Pain Score      Pain Loc      Pain Education      Exclude from Growth Chart     Most recent vital signs: Vitals:   11/26/24 2130 11/26/24 2200  BP: (!) 141/51 (!) 136/58  Pulse: 100 96  Resp: (!) 25 (!) 22  Temp: (!) 100.6 F (38.1 C)   SpO2: 99% 95%    Nursing Triage Note reviewed. Vital signs reviewed and patients oxygen saturation is normoxic  General: Patient is well  nourished, well developed, awake and alert, appears unwell Head: Normocephalic and atraumatic Eyes: Normal inspection, extraocular muscles intact, no conjunctival pallor Ear, nose, throat: Normal external exam Neck: Normal range of motion Respiratory: Patient is in no respiratory distress, lungs CTAB Cardiovascular: Patient is tachycardic, RR GI: Abd SNT with no guarding or rebound  Back: Normal inspection of the back with good strength and range of motion throughout all ext Extremities: pulses intact with good cap refills, no LE pitting edema or calf tenderness Neuro: The patient is alert and oriented to person, place, and time, appropriately conversive, with 5/5 bilat UE/LE strength, no gross motor or sensory defects noted. Coordination appears to be adequate. Skin: Warm, dry, and intact Psych: anxious mood and affect, no SI or HI  ED Results / Procedures / Treatments   Labs (all labs ordered are listed, but only abnormal results are displayed) Labs Reviewed  CBC WITH DIFFERENTIAL/PLATELET - Abnormal; Notable for the following components:      Result  Value   RBC 2.75 (*)    Hemoglobin 9.2 (*)    HCT 28.6 (*)    MCV 104.0 (*)    RDW 16.6 (*)    Neutro Abs 8.0 (*)    All other components within normal limits  COMPREHENSIVE METABOLIC PANEL WITH GFR - Abnormal; Notable for the following components:   Chloride 94 (*)    Glucose, Bld 189 (*)    Creatinine, Ser 3.55 (*)    Alkaline Phosphatase 143 (*)    GFR, Estimated 13 (*)    All other components within normal limits  LACTIC ACID, PLASMA - Abnormal; Notable for the following components:   Lactic Acid, Venous 2.3 (*)    All other components within normal limits  URINALYSIS, COMPLETE (UACMP) WITH MICROSCOPIC - Abnormal; Notable for the following components:   Color, Urine YELLOW (*)    APPearance TURBID (*)    Hgb urine dipstick SMALL (*)    Protein, ur 30 (*)    Leukocytes,Ua LARGE (*)    All other components within normal  limits  BLOOD GAS, VENOUS - Abnormal; Notable for the following components:   pH, Ven 7.5 (*)    pCO2, Ven 43 (*)    Bicarbonate 33.5 (*)    Acid-Base Excess 9.2 (*)    All other components within normal limits  RESP PANEL BY RT-PCR (RSV, FLU A&B, COVID)  [id number]  CULTURE, BLOOD (ROUTINE X 2)  CULTURE, BLOOD (ROUTINE X 2)  URINE CULTURE  LIPASE, BLOOD  PROCALCITONIN     EKG EKG and rhythm strip are interpreted by myself:   EKG: [Normal sinus rhythm] at heart rate of 98, normal QRS duration, QTc 466, nonspecific ST segments and T waves no ectopy EKG not consistent with Acute STEMI Rhythm strip: NSR in lead II   RADIOLOGY CXR: No acute abnormality on my independent review interpretation radiologist agrees    PROCEDURES:  Critical Care performed: No  Procedures   MEDICATIONS ORDERED IN ED: Medications  acetaminophen  (TYLENOL ) tablet 975 mg (975 mg Oral Given 11/26/24 2211)  cefTRIAXone  (ROCEPHIN ) 1 g in sodium chloride  0.9 % 100 mL IVPB (0 g Intravenous Stopped 11/26/24 2241)     IMPRESSION / MDM / ASSESSMENT AND PLAN / ED COURSE                                Differential diagnosis includes, but is not limited to, sepsis, UTI, pneumonia, URI  ED course: Unfortunately I am not able to see the urinalysis data although any urine culture that was performed in clinic today.  Her last urine culture grew out Enterobacter which was pansensitive.  Patient does arrive febrile and tachycardic.  She did have a slightly elevated lactic acid however she did have dialysis today and this might be secondary to fluid being taken off.  She had no profound electrolyte derangements.  Chest x-ray was unremarkable.  Urinalysis was concerning for possible UTI however no bacteria was seen and this may be sterilized by the ceftriaxone  dose she did receive earlier.  I did send the urine specimen obtained today for urine culture.  Her procalcitonin is in the indeterminate zone.  I have continued the  ceftriaxone  as patient does technically meet SIRS criteria with fever, tachycardia, increased respiratory rate and elevated lactic acidosis.  Patient and husband are extremely nervous to return home so we will discuss possible observation with the hospitalist on-call  Clinical Course  as of 11/26/24 2347  Thu Nov 26, 2024  2030 CBC with Differential(!) No leukocytosis, [HD]  2100 Lactic Acid, Venous(!!): 2.3 [HD]  2104 Husband at bedside, provides additional history that patient was well until 6pm today.  [HD]  2153 Talking with lab, the CMP got kicked off the machine however they are rerunning it now [HD]  2200 Creatinine(!): 3.55 Distant with dialysis [HD]  2200 Urinalysis, Complete w Microscopic -Urine, Clean Catch(!) Unclear what to make of this [HD]  2215 I reviewed the results with the patient and her husband.  Both feeling comfortable returning home at this time given prior presentations.  Will bring the patient into the hospitalist [HD]  2244 Procalcitonin: 0.58 In the indeterminate zone [HD]  2251 Case discussed with the hospitalist for admission [HD]    Clinical Course User Index [HD] Nicholaus Rolland BRAVO, MD   -- Risk: 5 This patient has a high risk of morbidity due to further diagnostic testing or treatment. Rationale: This patients evaluation and management involve a high risk of morbidity due to the potential severity of presenting symptoms, need for diagnostic testing, and/or initiation of treatment that may require close monitoring. The differential includes conditions with potential for significant deterioration or requiring escalation of care. Treatment decisions in the ED, including medication administration, procedural interventions, or disposition planning, reflect this level of risk. COPA: 5 The patient has the following acute or chronic illness/injury that poses a possible threat to life or bodily function: [X] : The patient has a potentially serious acute condition or an  acute exacerbation of a chronic illness requiring urgent evaluation and management in the Emergency Department. The clinical presentation necessitates immediate consideration of life-threatening or function-threatening diagnoses, even if they are ultimately ruled out.   FINAL CLINICAL IMPRESSION(S) / ED DIAGNOSES   Final diagnoses:  Fever, unspecified fever cause  Urinary tract infection without hematuria, site unspecified  General weakness     Rx / DC Orders   ED Discharge Orders     None        Note:  This document was prepared using Dragon voice recognition software and may include unintentional dictation errors.   Nicholaus Rolland BRAVO, MD 11/26/24 [phone]  "

## 2024-11-26 NOTE — ED Notes (Signed)
 CCMD called to admit patient to monitor

## 2024-11-26 NOTE — ED Triage Notes (Signed)
 Patient to ED via [facility] from home. Patient is a dialysis patient and was receiving dialysis today when she was administered an IV antibiotic for a UTI. Patient states that at home tonight, around 6 PM, she began having chills. Patient does not complain of any other symptoms. PMH includes hypertension, diabetes, and renal failure.  EMS Vitals: 180 CBG 37 etCO2 100-110 HR 131/62 91% on room air (placed on 3L [place] with O2 sats at 93%). Patient does not wear oxygen at home.

## 2024-11-26 NOTE — H&P (Incomplete)
 " History and Physical    Patient: Heather Lopez FMW:[id number] DOB: June 16, 1946 DOA: 11/26/2024 DOS: the patient was seen and examined on 11/26/2024 PCP: Franchot Houston, PA-C  Patient coming from: {Point_of_Origin:[address]}  Chief Complaint:  Chief Complaint  Patient presents with   Chills    HPI: Heather Lopez is a 79 y.o. female with medical history significant for ESRD only on HD TTS, HTN, HLD, IDDM, obesity, anxiety/depression, nephrolithiasis with prior ureteral stents, admitted in November 2025 with sepsis secondary to E. coli pyelonephritis, urinary retention and discharged home with a Foley catheter at the time, seen by urology yesterday 11/25/2024 when she was doing well (note reviewed) 79 year old female history of recurrent UTI dialysis with last end of End[facility] following urosepsis Today went to dialysis and got her fourth session of dialysis today she still makes urine she gave them a urine sample and they were concerned that the urine was infected so she got a dose of ceftriaxone  at the dialysis center with a plan to get ceftriaxone  8 for the next 3 days and as an outpatient and she went home and at 6 PM developed a no fever was Roselee increased work of breathing likely he meets like sepsis criteria he is febrile he will and he is tachycardic he had increased respiratory rate and a lactic acid slightly elevated who noted that secondary to just having dialysis on a but her procalcitonin could have fallen in the indeterminate plan the main reason why I had to do like to meet criteria to give her 1 antibiotics were given a lower dose of ceftriaxone  they have told HR hours after the last 1 technically tachycardia etiology initiated-operating August and understandably so and I think she probably warrants a try for hours of observation making sure that this remains in the right way she did present 8/40 acutely and week okay thank     Review of Systems: {ROS_Text:26778}  Past Medical History:   Diagnosis Date   Acute renal failure 06/27/2021   Anemia in chronic kidney disease    Anxiety    Aortic atherosclerosis    Atrial fibrillation (HCC)    in 2022 while in hospital for sepsis   Cataract    Depression    Dysrhythmia    ESRD on dialysis (HCC)    M, W, F   GERD (gastroesophageal reflux disease)    Gout    History of kidney stones    Hydronephrosis of right kidney    Hyperlipidemia    Hypertension, essential    Nausea & vomiting 12/02/2021   Obesity    Obstruction of left ureteropelvic junction (UPJ) due to stone 06/2021   Osteoarthritis    Perforated duodenal ulcer (HCC) 06/27/2021   Pyonephrosis 06/2021   left   Septic shock (HCC) 06/27/2021   Spontaneous bacterial peritonitis (HCC)    Subdural hematoma (HCC) 08/2023   Type 2 diabetes mellitus with stage 5 chronic kidney disease (HCC)    Vertigo    Past Surgical History:  Procedure Laterality Date   A/V FISTULAGRAM Left 04/23/2024   Procedure: A/V Fistulagram;  Surgeon: Marea Selinda RAMAN, MD;  Location: [facility];  Service: Cardiovascular;  Laterality: Left;   A/V FISTULAGRAM Left 09/17/2024   Procedure: A/V Fistulagram;  Surgeon: Marea Selinda RAMAN, MD;  Location: [facility];  Service: Cardiovascular;  Laterality: Left;   AV FISTULA PLACEMENT Left 01/09/2024   Procedure: ARTERIOVENOUS (AV) FISTULA CREATION (BRACHIAL CEPHALIC);  Surgeon: Marea Selinda RAMAN, MD;  Location: [facility];  Service: Vascular;  Laterality: Left;   CAPD REMOVAL N/A 08/02/2023   Procedure: CONTINUOUS AMBULATORY PERITONEAL DIALYSIS  (CAPD) CATHETER REMOVAL;  Surgeon: Jordis Laneta FALCON, MD;  Location: [facility];  Service: General;  Laterality: N/A;   CATARACT EXTRACTION, BILATERAL Bilateral 2019   COLONOSCOPY WITH PROPOFOL  N/A 08/19/2015   Procedure: COLONOSCOPY WITH PROPOFOL ;  Surgeon: Rogelia Copping, MD;  Location: [facility];  Service: Endoscopy;  Laterality: N/A;  diabetic - insulin    CYSTOSCOPY W/  RETROGRADES Bilateral 12/22/2019   Procedure: CYSTOSCOPY WITH RETROGRADE PYELOGRAM;  Surgeon: Twylla Glendia BROCKS, MD;  Location: [facility];  Service: Urology;  Laterality: Bilateral;   CYSTOSCOPY W/ URETERAL STENT PLACEMENT Bilateral 07/05/2021   Procedure: CYSTOSCOPY WITH RETROGRADE PYELOGRAM/BILATERAL URETERAL STENT PLACEMENT;  Surgeon: Twylla Glendia BROCKS, MD;  Location: [facility];  Service: Urology;  Laterality: Bilateral;   CYSTOSCOPY W/ URETERAL STENT PLACEMENT Bilateral 12/26/2021   Procedure: CYSTOSCOPY WITH STENT REMOVAL;  Surgeon: Twylla Glendia BROCKS, MD;  Location: [facility];  Service: Urology;  Laterality: Bilateral;   CYSTOSCOPY W/ URETERAL STENT PLACEMENT Right 03/13/2022   Procedure: CYSTOSCOPY WITH RETROGRADE PYELOGRAM/URETERAL STENT PLACEMENT;  Surgeon: Twylla Glendia BROCKS, MD;  Location: [facility];  Service: Urology;  Laterality: Right;   CYSTOSCOPY W/ URETERAL STENT PLACEMENT Right 01/01/2023   Procedure: CYSTOSCOPY WITH STENT EXCHANGE;  Surgeon: Twylla Glendia BROCKS, MD;  Location: [facility];  Service: Urology;  Laterality: Right;   CYSTOSCOPY W/ URETERAL STENT REMOVAL Right 05/08/2022   Procedure: CYSTOSCOPY WITH STENT REMOVAL;  Surgeon: Twylla Glendia BROCKS, MD;  Location: [facility];  Service: Urology;  Laterality: Right;   CYSTOSCOPY W/ URETERAL STENT REMOVAL Right 07/16/2024   Procedure: REMOVAL, STENT, URETER, CYSTOSCOPIC;  Surgeon: Twylla Glendia BROCKS, MD;  Location: [facility];  Service: Urology;  Laterality: Right;   CYSTOSCOPY WITH BIOPSY N/A 12/22/2019   Procedure: CYSTOSCOPY WITH bladder BIOPSY;  Surgeon: Twylla Glendia BROCKS, MD;  Location: [facility];  Service: Urology;  Laterality: N/A;   CYSTOSCOPY WITH BIOPSY  03/13/2022   Procedure: CYSTOSCOPY WITH BLADDER BIOPSY;  Surgeon: Twylla Glendia BROCKS, MD;  Location: [facility];  Service: Urology;;   CYSTOSCOPY WITH STENT PLACEMENT Right 12/22/2019   Procedure: CYSTOSCOPY WITH STENT PLACEMENT;  Surgeon: Twylla Glendia BROCKS, MD;  Location: [facility];   Service: Urology;  Laterality: Right;   CYSTOSCOPY WITH STENT PLACEMENT Right 07/03/2022   Procedure: CYSTOSCOPY/RIGHT URETEROSCOPY WITH STENT PLACEMENT;  Surgeon: Twylla Glendia BROCKS, MD;  Location: [facility];  Service: Urology;  Laterality: Right;   CYSTOSCOPY WITH URETEROSCOPY Bilateral 12/22/2019   Procedure: CYSTOSCOPY WITH URETEROSCOPY;  Surgeon: Twylla Glendia BROCKS, MD;  Location: [facility];  Service: Urology;  Laterality: Bilateral;   CYSTOSCOPY/URETEROSCOPY/HOLMIUM LASER/STENT PLACEMENT Right 12/22/2019   Procedure: CYSTOSCOPY/URETEROSCOPY/HOLMIUM LASER/STENT PLACEMENT;  Surgeon: Twylla Glendia BROCKS, MD;  Location: [facility];  Service: Urology;  Laterality: Right;   CYSTOSCOPY/URETEROSCOPY/HOLMIUM LASER/STENT PLACEMENT Bilateral 12/08/2021   Procedure: CYSTOSCOPY/URETEROSCOPY/HOLMIUM LASER/STENT PLACEMENT;  Surgeon: Francisca Redell BROCKS, MD;  Location: [facility];  Service: Urology;  Laterality: Bilateral;   DIALYSIS/PERMA CATHETER INSERTION N/A 06/29/2021   Procedure: DIALYSIS/PERMA CATHETER INSERTION;  Surgeon: Marea Selinda RAMAN, MD;  Location: [facility];  Service: Cardiovascular;  Laterality: N/A;   DIALYSIS/PERMA CATHETER INSERTION N/A 07/13/2021   Procedure: DIALYSIS/PERMA CATHETER INSERTION;  Surgeon: Marea Selinda RAMAN, MD;  Location: [facility];  Service: Cardiovascular;  Laterality: N/A;   DIALYSIS/PERMA CATHETER INSERTION N/A 08/01/2023   Procedure: DIALYSIS/PERMA CATHETER INSERTION;  Surgeon: Marea Selinda RAMAN, MD;  Location: [facility];  Service: Cardiovascular;  Laterality: N/A;   DIALYSIS/PERMA CATHETER REMOVAL N/A 06/18/2024   Procedure: DIALYSIS/PERMA CATHETER REMOVAL;  Surgeon: Marea Selinda RAMAN, MD;  Location: [facility];  Service: Cardiovascular;  Laterality: N/A;   EYE SURGERY     INCISIONAL HERNIA REPAIR  06/28/2023   IR GASTROSTOMY TUBE MOD SED  08/01/2021   LAPAROTOMY N/A 06/29/2021   Procedure: EXPLORATORY LAPAROTOMY WITH REPAIR OF DUODENAL  PERFORATION;  Surgeon: Desiderio Schanz, MD;  Location: [facility];  Service: General;  Laterality: N/A;   POLYPECTOMY  08/19/2015   Procedure: POLYPECTOMY;  Surgeon: Rogelia Copping, MD;  Location[facility];  Service: Endoscopy;;   TUBAL LIGATION  1992   Social History:  reports that she quit smoking about 53 years ago. Her smoking use included cigarettes. She started smoking about 59 years ago. She has a 4.5 pack-year smoking history. She has never used smokeless tobacco. She reports that she does not drink alcohol and does not use drugs.  Allergies[1]  Family History  Problem Relation Age of Onset   Diabetes Father    Cancer Father        lung cancer   Diabetes Brother    Stroke Mother     Prior to Admission medications  Medication Sig Start Date End Date Taking? Authorizing Provider  aspirin  81 MG EC tablet Take 81 mg by mouth daily.    [provider]  atorvastatin  (LIPITOR) 20 MG tablet Take 20 mg by mouth at bedtime.    [provider]  buPROPion  (WELLBUTRIN  XL) 150 MG 24 hr tablet Take 150 mg by mouth every morning. 06/26/24   [provider]  busPIRone  (BUSPAR ) 5 MG tablet Take 5 mg by mouth every morning.    [provider]  cloNIDine  (CATAPRES ) 0.1 MG tablet Take 1 tablet (0.1 mg total) by mouth 2 (two) times daily. 12/11/21   Patel, Sona, MD  Dulaglutide  3 MG/0.5ML SOAJ Inject 3 mg into the skin once a week. Sundays 04/02/24   [provider]  Insulin  Aspart FlexPen (NOVOLOG ) 100 UNIT/ML Inject 14 Units into the skin 2 (two) times daily. 04/06/24   [provider]  insulin  glargine (LANTUS ) 100 UNIT/ML injection Inject 0.15 mLs (15 Units total) into the skin at bedtime. 09/22/23   Lanetta Lingo, MD  loperamide  (IMODIUM  A-D) 2 MG tablet Take 2 mg by mouth every 6 (six) hours as needed for diarrhea or loose stools.    [provider]  metoprolol  tartrate (LOPRESSOR ) 100 MG tablet Take 1 tablet (100 mg total) by  mouth 2 (two) times daily. 12/11/21   Patel, Sona, MD  Multiple Vitamin (MULTIVITAMIN WITH MINERALS) TABS tablet Take 1 tablet by mouth daily. 12/12/21   Patel, Sona, MD  sevelamer  carbonate (RENVELA ) 800 MG tablet Take 1,600 mg by mouth 3 (three) times daily. 04/12/24   [provider]    Physical Exam: Vitals:   11/26/24 1957 11/26/24 1958 11/26/24 2130 11/26/24 2200  BP: (!) 156/50  (!) 141/51 (!) 136/58  Pulse: 100  100 96  Resp: (!) 29  (!) 25 (!) 22  Temp: (!) 103.2 F (39.6 C)  (!) 100.6 F (38.1 C)   TempSrc: Oral  Oral   SpO2: 95%  99% 95%  Weight:  96.9 kg    Height:  5' 3 (1.6 m)     Physical Exam  Labs on Admission: I have personally reviewed following labs and imaging studies  CBC: Recent  Labs  Lab 11/26/24 2013  WBC 10.2  NEUTROABS 8.0*  HGB 9.2*  HCT 28.6*  MCV 104.0*  PLT 319   Basic Metabolic Panel: Recent Labs  Lab 11/26/24 2013  NA 137  K 4.1  CL 94*  CO2 29  GLUCOSE 189*  BUN 14  CREATININE 3.55*  CALCIUM  9.1   GFR: Estimated Creatinine Clearance: 14.7 mL/min (A) (by C-G formula based on SCr of 3.55 mg/dL (H)). Liver Function Tests: Recent Labs  Lab 11/26/24 2013  AST 17  ALT 13  ALKPHOS 143*  BILITOT 0.3  PROT 7.4  ALBUMIN  3.5   Recent Labs  Lab 11/26/24 2013  LIPASE 51   No results for input(s): AMMONIA in the last 168 hours. Coagulation Profile: No results for input(s): INR, PROTIME in the last 168 hours. Cardiac Enzymes: No results for input(s): CKTOTAL, CKMB, CKMBINDEX, TROPONINI in the last 168 hours. BNP (last 3 results) No results for input(s): PROBNP in the last 8760 hours. HbA1C: No results for input(s): HGBA1C in the last 72 hours. CBG: No results for input(s): GLUCAP in the last 168 hours. Lipid Profile: No results for input(s): CHOL, HDL, LDLCALC, TRIG, CHOLHDL, LDLDIRECT in the last 72 hours. Thyroid  Function Tests: No results for input(s): TSH, T4TOTAL,  FREET4, T3FREE, THYROIDAB in the last 72 hours. Anemia Panel: No results for input(s): VITAMINB12, FOLATE, FERRITIN, TIBC, IRON , RETICCTPCT in the last 72 hours. Urine analysis:    Component Value Date/Time   COLORURINE YELLOW (A) 11/26/2024 2107   APPEARANCEUR TURBID (A) 11/26/2024 2107   APPEARANCEUR Cloudy (A) 11/25/2024 1059   LABSPEC 1.005 11/26/2024 2107   LABSPEC 1.024 01/27/2014 1350   PHURINE 7.0 11/26/2024 2107   GLUCOSEU NEGATIVE 11/26/2024 2107   GLUCOSEU >=500 01/27/2014 1350   HGBUR SMALL (A) 11/26/2024 2107   BILIRUBINUR NEGATIVE 11/26/2024 2107   BILIRUBINUR Negative 11/25/2024 1059   BILIRUBINUR Negative 01/27/2014 1350   KETONESUR NEGATIVE 11/26/2024 2107   PROTEINUR 30 (A) 11/26/2024 2107   UROBILINOGEN 0.2 02/10/2022 0907   NITRITE NEGATIVE 11/26/2024 2107   LEUKOCYTESUR LARGE (A) 11/26/2024 2107   LEUKOCYTESUR 2+ 01/27/2014 1350    Radiological Exams on Admission: DG Chest Portable 1 View Result Date: 11/26/2024 EXAM: 1 VIEW(S) XRAY OF THE CHEST 11/26/2024 09:26:00 PM COMPARISON: 04/23/2024 CLINICAL HISTORY: evaluate for pna FINDINGS: LINES, TUBES AND DEVICES: Right internal jugular central venous catheter removed. LUNGS AND PLEURA: Low lung volumes with bronchovascular crowding. Mild right atelectasis. Stable left basilar scarring. No pleural effusion. No pneumothorax. HEART AND MEDIASTINUM: No acute abnormality of the cardiac and mediastinal silhouettes. BONES AND SOFT TISSUES: No acute osseous abnormality. IMPRESSION: 1. No focal consolidation to suggest pneumonia; low lung volumes with bronchovascular crowding and mild right atelectasis. 2. Stable left basilar scarring. Electronically signed by: Dorethia Molt MD 11/26/2024 09:42 PM EST RP Workstation: [id number]   Data Reviewed for HPI: Relevant notes from primary care and specialist visits, past discharge summaries as available in EHR, including Care Everywhere. Prior diagnostic testing as  pertinent to current admission diagnoses Updated medications and problem lists for reconciliation ED course, including vitals, labs, imaging, treatment and response to treatment Triage notes, nursing and pharmacy notes and ED provider's notes Notable results as noted above in HPI      Assessment and Plan: No notes have been filed under this hospital service. Service: Hospitalist       DVT prophylaxis: Lovenox ***  Consults: none***  Advance Care Planning:   Code Status: Prior ***  Family Communication: none***  Disposition Plan: Back to previous home environment  Severity of Illness: {Observation/Inpatient:21159}  Author: Delayne LULLA Solian, MD 11/26/2024 10:53 PM  For on call review [email or web address].       [1] Allergies Allergen Reactions   Contrast Media  [Iodinated Contrast Media] Anaphylaxis   Iodine  Swelling    (IV only) - angioedema  "

## 2024-11-26 NOTE — ED Notes (Signed)
Lactic acid 2.3. MD made aware.

## 2024-11-26 NOTE — ED Notes (Signed)
 Bladder scan performed. Scan showed 3mL of urine.

## 2024-11-27 ENCOUNTER — Ambulatory Visit: Payer: Self-pay | Admitting: Urology

## 2024-11-27 DIAGNOSIS — A419 Sepsis, unspecified organism: Secondary | ICD-10-CM | POA: Diagnosis not present

## 2024-11-27 DIAGNOSIS — N390 Urinary tract infection, site not specified: Secondary | ICD-10-CM | POA: Diagnosis not present

## 2024-11-27 LAB — PROTIME-INR
INR: 1.2 (ref 0.8–1.2)
Prothrombin Time: 15.6 s — ABNORMAL HIGH (ref 11.4–15.2)

## 2024-11-27 LAB — HEPATITIS B SURFACE ANTIGEN: Hepatitis B Surface Ag: NONREACTIVE

## 2024-11-27 LAB — GLUCOSE, CAPILLARY
Glucose-Capillary: 175 mg/dL — ABNORMAL HIGH (ref 70–99)
Glucose-Capillary: 209 mg/dL — ABNORMAL HIGH (ref 70–99)
Glucose-Capillary: 163 mg/dL — ABNORMAL HIGH (ref 70–99)
Glucose-Capillary: 166 mg/dL — ABNORMAL HIGH (ref 70–99)

## 2024-11-27 LAB — CBG MONITORING, ED: Glucose-Capillary: 249 mg/dL — ABNORMAL HIGH (ref 70–99)

## 2024-11-27 LAB — CORTISOL-AM, BLOOD: Cortisol - AM: 11.6 ug/dL (ref 6.7–22.6)

## 2024-11-27 MED ORDER — LOPERAMIDE HCL 2 MG PO CAPS: 2.0000 mg | ORAL_CAPSULE | Freq: Four times a day (QID) | ORAL | Status: DC | PRN

## 2024-11-27 MED ORDER — ATORVASTATIN CALCIUM 20 MG PO TABS
20.0000 mg | ORAL_TABLET | Freq: Every day | ORAL | Status: DC
  Administered 2024-11-27 – 2024-11-29 (×3): 20 mg via ORAL
  Filled 2024-11-27 (×3): qty 1

## 2024-11-27 MED ORDER — INSULIN GLARGINE 100 UNIT/ML ~~LOC~~ SOLN: 15.0000 [IU] | Freq: Every day | SUBCUTANEOUS | Status: DC

## 2024-11-27 MED ORDER — DOXYCYCLINE HYCLATE 100 MG PO TABS
100.0000 mg | ORAL_TABLET | Freq: Two times a day (BID) | ORAL | Status: DC
  Administered 2024-11-27 – 2024-11-29 (×4): 100 mg via ORAL
  Filled 2024-11-27 (×4): qty 1

## 2024-11-27 MED ORDER — HEPARIN SODIUM (PORCINE) 5000 UNIT/ML IJ SOLN
5000.0000 [IU] | Freq: Three times a day (TID) | INTRAMUSCULAR | Status: DC
  Administered 2024-11-27 – 2024-11-29 (×7): 5000 [IU] via SUBCUTANEOUS
  Filled 2024-11-27 (×7): qty 1

## 2024-11-27 MED ORDER — ASPIRIN 81 MG PO TBEC
81.0000 mg | DELAYED_RELEASE_TABLET | Freq: Every day | ORAL | Status: DC
  Administered 2024-11-27 – 2024-11-29 (×3): 81 mg via ORAL
  Filled 2024-11-27 (×3): qty 1

## 2024-11-27 MED ORDER — LACTATED RINGERS IV SOLN: INTRAVENOUS | Status: AC

## 2024-11-27 MED ORDER — SODIUM CHLORIDE 0.9 % IV SOLN
1.0000 g | Freq: Once | INTRAVENOUS | Status: AC
  Administered 2024-11-27: 1 g via INTRAVENOUS
  Filled 2024-11-27: qty 10

## 2024-11-27 MED ORDER — CHLORHEXIDINE GLUCONATE CLOTH 2 % EX PADS
6.0000 | MEDICATED_PAD | Freq: Every day | CUTANEOUS | Status: DC
  Administered 2024-11-29: 6 via TOPICAL

## 2024-11-27 MED ORDER — BUPROPION HCL ER (XL) 150 MG PO TB24
150.0000 mg | ORAL_TABLET | ORAL | Status: DC
  Administered 2024-11-27 – 2024-11-29 (×3): 150 mg via ORAL
  Filled 2024-11-27 (×3): qty 1

## 2024-11-27 MED ORDER — HYDROCODONE-ACETAMINOPHEN 5-325 MG PO TABS: 1.0000 | ORAL_TABLET | ORAL | Status: DC | PRN

## 2024-11-27 MED ORDER — LACTATED RINGERS IV BOLUS (SEPSIS)
1000.0000 mL | Freq: Once | INTRAVENOUS | Status: AC
  Administered 2024-11-27: 1000 mL via INTRAVENOUS

## 2024-11-27 MED ORDER — INSULIN ASPART 100 UNIT/ML IJ SOLN
0.0000 [IU] | Freq: Every day | INTRAMUSCULAR | Status: DC
  Administered 2024-11-27: 2 [IU] via SUBCUTANEOUS
  Filled 2024-11-27: qty 2

## 2024-11-27 MED ORDER — INSULIN GLARGINE 100 UNIT/ML ~~LOC~~ SOLN
10.0000 [IU] | Freq: Every day | SUBCUTANEOUS | Status: DC
  Administered 2024-11-27 – 2024-11-28 (×3): 10 [IU] via SUBCUTANEOUS
  Filled 2024-11-27 (×4): qty 0.1

## 2024-11-27 MED ORDER — ONDANSETRON HCL 4 MG/2ML IJ SOLN: 4.0000 mg | Freq: Four times a day (QID) | INTRAMUSCULAR | Status: DC | PRN

## 2024-11-27 MED ORDER — ACETAMINOPHEN 650 MG RE SUPP: 650.0000 mg | Freq: Four times a day (QID) | RECTAL | Status: DC | PRN

## 2024-11-27 MED ORDER — ACETAMINOPHEN 325 MG PO TABS: 650.0000 mg | ORAL_TABLET | Freq: Four times a day (QID) | ORAL | Status: DC | PRN

## 2024-11-27 MED ORDER — INSULIN ASPART 100 UNIT/ML IJ SOLN
0.0000 [IU] | Freq: Three times a day (TID) | INTRAMUSCULAR | Status: DC
  Administered 2024-11-27: 2 [IU] via SUBCUTANEOUS
  Administered 2024-11-27 (×2): 1 [IU] via SUBCUTANEOUS
  Administered 2024-11-28: 3 [IU] via SUBCUTANEOUS
  Administered 2024-11-28: 1 [IU] via SUBCUTANEOUS
  Administered 2024-11-29 (×2): 2 [IU] via SUBCUTANEOUS
  Filled 2024-11-27 (×2): qty 3
  Filled 2024-11-27 (×2): qty 1
  Filled 2024-11-27 (×2): qty 2
  Filled 2024-11-27: qty 1

## 2024-11-27 MED ORDER — SEVELAMER CARBONATE 800 MG PO TABS
1600.0000 mg | ORAL_TABLET | Freq: Three times a day (TID) | ORAL | Status: DC
  Administered 2024-11-27 – 2024-11-29 (×7): 1600 mg via ORAL
  Filled 2024-11-27 (×9): qty 2

## 2024-11-27 MED ORDER — LACTATED RINGERS IV SOLN: 150.0000 mL/h | INTRAVENOUS | Status: DC

## 2024-11-27 MED ORDER — ONDANSETRON HCL 4 MG PO TABS: 4.0000 mg | ORAL_TABLET | Freq: Four times a day (QID) | ORAL | Status: DC | PRN

## 2024-11-27 MED ORDER — SODIUM CHLORIDE 0.9 % IV SOLN
2.0000 g | INTRAVENOUS | Status: DC
  Administered 2024-11-27 – 2024-11-28 (×2): 2 g via INTRAVENOUS
  Filled 2024-11-27 (×2): qty 20

## 2024-11-27 NOTE — Assessment & Plan Note (Signed)
 Nephrology consult for continuation of dialysis Patient had a full session on 11/26/2024

## 2024-11-27 NOTE — Assessment & Plan Note (Signed)
 Will hold antihypertensives in the setting of sepsis to resume as appropriate

## 2024-11-27 NOTE — Plan of Care (Signed)

## 2024-11-27 NOTE — Assessment & Plan Note (Signed)
Sliding scale insulin coverage Continue basal insulin

## 2024-11-27 NOTE — Progress Notes (Signed)
 " [facility]  ROUNDING NOTE   Subjective:   Heather Lopez is a 79 y.o.  female with end stage renal disease on hemodialysis, hypertension, GERD, gout, hyeprlipidemia, diabetes mellitus type II and vertigo.  Patient presents to the emergency department complaining of chills and fever. She has been admitted for General weakness [R53.1] Sepsis secondary to UTI (HCC) [A41.9, N39.0] Fever, unspecified fever cause [R50.9] Urinary tract infection without hematuria, site unspecified [N39.0]  Patient is known to our practice and receives outpatient dialysis treatments at [facility] on a TTS schedule, supervised by Dr. Douglas. Last treatment completed on Thursday. States she had been complaining of burning with urination outpatient. They started her on antibiotics with dialysis. Husband states she developed chills after dinner.   Urine appears turbid with leukocytes, urine cultures pending.   We have been consulted to manage dialysis needs.   Objective:  Vital signs in last 24 hours:  Temp:  [97.8 F (36.6 C)-103.2 F (39.6 C)] 98.7 F (37.1 C) (01/02 0848) Pulse Rate:  [78-100] 88 (01/02 0848) Resp:  [16-29] 16 (01/02 0848) BP: (136-161)/(50-66) 161/66 (01/02 0848) SpO2:  [91 %-100 %] 100 % (01/02 0848) Weight:  [94.1 kg-96.9 kg] 94.1 kg (01/02 0302)  Weight change:  Filed Weights   11/26/24 1958 11/27/24 0302  Weight: 96.9 kg 94.1 kg    Intake/Output: I/O last 3 completed shifts: In: 1100 [IV Piggyback:1100] Out: -    Intake/Output this shift:  Total I/O In: 240 [P.O.:240] Out: -   Physical Exam: General: NAD  Head: Normocephalic, atraumatic. Moist oral mucosal membranes  Eyes: Anicteric  Neck: Supple  Lungs:  Clear to auscultation, room air  Heart: Regular rate and rhythm  Abdomen:  Soft, nontender  Extremities:  No peripheral edema.  Neurologic: Awake, alert, conversant  Skin: Warm,dry, no rash  Access: Lt AVF    Basic Metabolic Panel: Recent  Labs  Lab 11/26/24 2013  NA 137  K 4.1  CL 94*  CO2 29  GLUCOSE 189*  BUN 14  CREATININE 3.55*  CALCIUM  9.1    Liver Function Tests: Recent Labs  Lab 11/26/24 2013  AST 17  ALT 13  ALKPHOS 143*  BILITOT 0.3  PROT 7.4  ALBUMIN  3.5   Recent Labs  Lab 11/26/24 2013  LIPASE 51   No results for input(s): AMMONIA in the last 168 hours.  CBC: Recent Labs  Lab 11/26/24 2013  WBC 10.2  NEUTROABS 8.0*  HGB 9.2*  HCT 28.6*  MCV 104.0*  PLT 319    Cardiac Enzymes: No results for input(s): CKTOTAL, CKMB, CKMBINDEX, TROPONINI in the last 168 hours.  BNP: Invalid input(s): POCBNP  CBG: Recent Labs  Lab 11/27/24 0232 11/27/24 0759 11/27/24 1219  GLUCAP 249* 175* 209*    Microbiology: Results for orders placed or performed during the hospital encounter of 11/26/24  Resp panel by RT-PCR (RSV, Flu A&B, Covid) Anterior Nasal Swab     Status: None   Collection Time: 11/26/24  8:11 PM   Specimen: Anterior Nasal Swab  Result Value Ref Range Status   SARS Coronavirus 2 by RT PCR NEGATIVE NEGATIVE Final    Comment: (NOTE) SARS-CoV-2 target nucleic acids are NOT DETECTED.  The SARS-CoV-2 RNA is generally detectable in upper respiratory specimens during the acute phase of infection. The lowest concentration of SARS-CoV-2 viral copies this assay can detect is 138 copies/mL. A negative result does not preclude SARS-Cov-2 infection and should not be used as the sole basis  for treatment or other patient management decisions. A negative result may occur with  improper specimen collection/handling, submission of specimen other than nasopharyngeal swab, presence of viral mutation(s) within the areas targeted by this assay, and inadequate number of viral copies(<138 copies/mL). A negative result must be combined with clinical observations, patient history, and epidemiological information. The expected result is Negative.  Fact Sheet for Patients:   bloggercourse.com  Fact Sheet for Healthcare Providers:  seriousbroker.it  This test is no t yet approved or cleared by the [place]  FDA and  has been authorized for detection and/or diagnosis of SARS-CoV-2 by FDA under an Emergency Use Authorization (EUA). This EUA will remain  in effect (meaning this test can be used) for the duration of the COVID-19 declaration under Section 564(b)(1) of the Act, 21 U.S.C.section 360bbb-3(b)(1), unless the authorization is terminated  or revoked sooner.       Influenza A by PCR NEGATIVE NEGATIVE Final   Influenza B by PCR NEGATIVE NEGATIVE Final    Comment: (NOTE) The Xpert Xpress SARS-CoV-2/FLU/RSV plus assay is intended as an aid in the diagnosis of influenza from Nasopharyngeal swab specimens and should not be used as a sole basis for treatment. Nasal washings and aspirates are unacceptable for Xpert Xpress SARS-CoV-2/FLU/RSV testing.  Fact Sheet for Patients: bloggercourse.com  Fact Sheet for Healthcare Providers: seriousbroker.it  This test is not yet approved or cleared by the [place]  FDA and has been authorized for detection and/or diagnosis of SARS-CoV-2 by FDA under an Emergency Use Authorization (EUA). This EUA will remain in effect (meaning this test can be used) for the duration of the COVID-19 declaration under Section 564(b)(1) of the Act, 21 U.S.C. section 360bbb-3(b)(1), unless the authorization is terminated or revoked.     Resp Syncytial Virus by PCR NEGATIVE NEGATIVE Final    Comment: (NOTE) Fact Sheet for Patients: bloggercourse.com  Fact Sheet for Healthcare Providers: seriousbroker.it  This test is not yet approved or cleared by the [place]  FDA and has been authorized for detection and/or diagnosis of SARS-CoV-2 by FDA under an Emergency Use  Authorization (EUA). This EUA will remain in effect (meaning this test can be used) for the duration of the COVID-19 declaration under Section 564(b)(1) of the Act, 21 U.S.C. section 360bbb-3(b)(1), unless the authorization is terminated or revoked.  Performed at [facility], [address] [place], [place] [address]   Blood culture (routine x 2)     Status: None (Preliminary result)   Collection Time: 11/26/24  8:12 PM   Specimen: BLOOD RIGHT ARM  Result Value Ref Range Status   Specimen Description BLOOD RIGHT ARM  Final   Special Requests   Final    BOTTLES DRAWN AEROBIC AND ANAEROBIC Blood Culture results may not be optimal due to an inadequate volume of blood received in culture bottles   Culture   Final    NO GROWTH < 12 HOURS Performed at [facility], [address], [place], [place] [address]    Report Status PENDING  Incomplete  Blood culture (routine x 2)     Status: None (Preliminary result)   Collection Time: 11/26/24  8:12 PM   Specimen: BLOOD RIGHT FOREARM  Result Value Ref Range Status   Specimen Description BLOOD RIGHT FOREARM  Final   Special Requests   Final    BOTTLES DRAWN AEROBIC AND ANAEROBIC Blood Culture results may not be optimal due to an inadequate volume of blood received in culture bottles   Culture  Final    NO GROWTH < 12 HOURS Performed at [facility], [address] [place] [place], [place] [address]    Report Status PENDING  Incomplete    Coagulation Studies: Recent Labs    11/27/24 0516  LABPROT 15.6*  INR 1.2    Urinalysis: Recent Labs    11/25/24 1059 11/26/24 2107  COLORURINE  --  YELLOW*  LABSPEC  --  1.005  PHURINE  --  7.0  GLUCOSEU Negative NEGATIVE  HGBUR  --  SMALL*  BILIRUBINUR Negative NEGATIVE  KETONESUR  --  NEGATIVE  PROTEINUR 3+* 30*  NITRITE Negative NEGATIVE  LEUKOCYTESUR 3+* LARGE*      Imaging: DG Chest Portable 1 View Result Date: 11/26/2024 EXAM: 1 VIEW(S) XRAY OF THE  CHEST 11/26/2024 09:26:00 PM COMPARISON: 04/23/2024 CLINICAL HISTORY: evaluate for pna FINDINGS: LINES, TUBES AND DEVICES: Right internal jugular central venous catheter removed. LUNGS AND PLEURA: Low lung volumes with bronchovascular crowding. Mild right atelectasis. Stable left basilar scarring. No pleural effusion. No pneumothorax. HEART AND MEDIASTINUM: No acute abnormality of the cardiac and mediastinal silhouettes. BONES AND SOFT TISSUES: No acute osseous abnormality. IMPRESSION: 1. No focal consolidation to suggest pneumonia; low lung volumes with bronchovascular crowding and mild right atelectasis. 2. Stable left basilar scarring. Electronically signed by: Dorethia Molt MD 11/26/2024 09:42 PM EST RP Workstation: [id number]     Medications:    cefTRIAXone  (ROCEPHIN )  IV      aspirin  EC  81 mg Oral Daily   atorvastatin   20 mg Oral Daily   buPROPion   150 mg Oral BH-q7a   [START ON 11/28/2024] Chlorhexidine  Gluconate Cloth  6 each Topical Q0600   doxycycline  100 mg Oral Q12H   heparin   5,000 Units Subcutaneous Q8H   insulin  aspart  0-5 Units Subcutaneous QHS   insulin  aspart  0-6 Units Subcutaneous TID WC   insulin  glargine  10 Units Subcutaneous QHS   sevelamer  carbonate  1,600 mg Oral TID with meals   acetaminophen  **OR** acetaminophen , HYDROcodone -acetaminophen , loperamide , ondansetron  **OR** ondansetron  (ZOFRAN ) IV  Assessment/ Plan:  Ms. TAKEYLA MILLION is a 79 y.o.  female with end stage renal disease on hemodialysis, hypertension, GERD, gout, hyeprlipidemia, diabetes mellitus type II and vertigo.  Patient is currently admitted for General weakness [R53.1] Sepsis secondary to UTI (HCC) [A41.9, N39.0] Fever, unspecified fever cause [R50.9] Urinary tract infection without hematuria, site unspecified [N39.0]  [facility]/TTS/left AVF  Acute cystitis with hematuria, history of UTI and E-coli pyelonephritis. Septic workup due to fever, tachycardia and lactic acidosis. UA with  leukocytes. Prescribed Rocephin .   2.  End-stage renal disease on hemodialysis.  Last treatment received on Thursday.  No urgent indication for dialysis today.  Next treatment scheduled for Saturday.  3. Anemia of chronic kidney disease Lab Results  Component Value Date   HGB 9.2 (L) 11/26/2024    Hemoglobin within optimal range.  May consider low-dose ESA if needed.  4.  Hypertension with chronic kidney disease.  Home regimen includes clonidine  and metoprolol , both currently held.     LOS: 0 Tarhonda Hollenberg 1/2/20264:04 PM   "

## 2024-11-27 NOTE — Assessment & Plan Note (Signed)
 Nephrolithiasis with history of ureteral stent 2023 History of urinary retention requiring short-term Foley 09/2024 History of E. coli pyelonephritis with sepsis 09/2024 Sepsis criteria include fever, tachycardia and tachypnea with lactic acidosis, abnormal UA Will continue treatment with Rocephin  Sepsis fluids with close monitoring for fluid overload in view of dialysis status Antipyretics, antiemetics Patient seen by urology 11/25/2024.  Consultation not necessary at this time No concerns for urinary retention at this time

## 2024-11-27 NOTE — Progress Notes (Signed)
 " Progress Note   Patient: Heather Lopez FMW:[id number] DOB: 01-10-1946 DOA: 11/26/2024     0 DOS: the patient was seen and examined on 11/27/2024   Brief hospital course:  The patient is a 79 yr old woman who presented to [facility] ED on 11/26/2024 with complaints of chills and fever. The day before presentation the patient felt weak and h ad hematuria. A UA was performed at the dialysis center on 11/25/2025 that was positive for UTI. She developed shaking chills upon arrival home from HD.   The patient had been admitted to the hospital for #. Coli pyelonephritis with sepsis and urinary retention that required a foley catheter for a week.   In the ED the patient was febrile with temp of 103.2, tachycardic to 100 and tachypneic to 29. BP was 156/50 and O2 saturation was 93%. UA demonstrated large leukocytosis.   CXR demonstrated bronchovascular crowding without infiltrate.  She has been admitted to a med-surg bed under observation status due to UTI and sepsis. She is receiving IV Rocephin .   On 11/28/2023 nursing report yellow green vaginal discharge. The discharge was cultured, and doxycycline was added to her Rocephin .  Assessment and Plan: * Sepsis secondary to acute cystitis with hematuria (HCC) Nephrolithiasis with history of ureteral stent 2023 History of urinary retention requiring short-term Foley 09/2024 History of E. coli pyelonephritis with sepsis 09/2024 Sepsis criteria include fever, tachycardia and tachypnea with lactic acidosis, abnormal UA Will continue treatment with Rocephin  Sepsis fluids with close monitoring for fluid overload in view of dialysis status Antipyretics, antiemetics Patient seen by urology 11/25/2024.  Consultation not necessary at this time No concerns for urinary retention at this time  ESRD on dialysis [facility]) Nephrology consult for continuation of dialysis Patient had a full session on 11/26/2024  T2DM (type 2 diabetes mellitus) (HCC) Sliding scale insulin   coverage Continue basal insulin   Essential hypertension Will hold antihypertensives in the setting of sepsis to resume as appropriate        Subjective: The patient is resting comfortably. No new complaints.   Physical Exam: Vitals:   11/27/24 0235 11/27/24 0302 11/27/24 0848 11/27/24 1647  BP:  (!) 155/59 (!) 161/66 (!) 163/69  Pulse: 80 83 88 81  Resp: 18 17 16 17   Temp: 97.8 F (36.6 C) 98.3 F (36.8 C) 98.7 F (37.1 C) 98.7 F (37.1 C)  TempSrc: Oral Oral Oral   SpO2: 93% 94% 100% 95%  Weight:  94.1 kg    Height:  5' 3 (1.6 m)     Exam:  Constitutional:  The patient is awake, alert, and oriented x 3. No acute distress. Respiratory:  No increased work of breathing. No wheezes, rales, or rhonchi No tactile fremitus Cardiovascular:  Regular rate and rhythm No murmurs, ectopy, or gallups. No lateral PMI. No thrills. Abdomen:  Abdomen is soft, non-tender, non-distended No hernias, masses, or organomegaly Normoactive bowel sounds.  Musculoskeletal:  No cyanosis, clubbing, or edema Skin:  No rashes, lesions, ulcers palpation of skin: no induration or nodules Neurologic:  CN 2-12 intact Sensation all 4 extremities intact Psychiatric:  Mental status Mood, affect appropriate Orientation to person, place, time  judgment and insight appear intact  Data Reviewed:  CBC, BMP  Family Communication: None available.  Disposition: Status is: Observation The patient remains OBS appropriate and will d/c before 2 midnights.  Planned Discharge Destination: tbd    Time spent: 42 minutes  Author: Vicky Schleich, DO 11/27/2024 5:55 PM  For on call review  [email or web address]  "

## 2024-11-27 NOTE — Plan of Care (Signed)

## 2024-11-27 NOTE — Care Management Obs Status (Signed)
 MEDICARE OBSERVATION STATUS NOTIFICATION   Patient Details  Name: Heather Lopez MRN: [id number] Date of Birth: July 19, 1946   Medicare Observation Status Notification Given:  Yes    Marlen Mollica W, CMA 11/27/2024, 3:42 PM

## 2024-11-28 DIAGNOSIS — M109 Gout, unspecified: Secondary | ICD-10-CM | POA: Diagnosis present

## 2024-11-28 DIAGNOSIS — Z7982 Long term (current) use of aspirin: Secondary | ICD-10-CM | POA: Diagnosis not present

## 2024-11-28 DIAGNOSIS — K219 Gastro-esophageal reflux disease without esophagitis: Secondary | ICD-10-CM | POA: Diagnosis present

## 2024-11-28 DIAGNOSIS — Z87891 Personal history of nicotine dependence: Secondary | ICD-10-CM | POA: Diagnosis not present

## 2024-11-28 DIAGNOSIS — Z801 Family history of malignant neoplasm of trachea, bronchus and lung: Secondary | ICD-10-CM | POA: Diagnosis not present

## 2024-11-28 DIAGNOSIS — E1122 Type 2 diabetes mellitus with diabetic chronic kidney disease: Secondary | ICD-10-CM | POA: Diagnosis present

## 2024-11-28 DIAGNOSIS — Z823 Family history of stroke: Secondary | ICD-10-CM | POA: Diagnosis not present

## 2024-11-28 DIAGNOSIS — N3001 Acute cystitis with hematuria: Secondary | ICD-10-CM | POA: Diagnosis present

## 2024-11-28 DIAGNOSIS — N186 End stage renal disease: Secondary | ICD-10-CM | POA: Diagnosis present

## 2024-11-28 DIAGNOSIS — Z91041 Radiographic dye allergy status: Secondary | ICD-10-CM | POA: Diagnosis not present

## 2024-11-28 DIAGNOSIS — N390 Urinary tract infection, site not specified: Secondary | ICD-10-CM | POA: Diagnosis not present

## 2024-11-28 DIAGNOSIS — E872 Acidosis, unspecified: Secondary | ICD-10-CM | POA: Diagnosis present

## 2024-11-28 DIAGNOSIS — N898 Other specified noninflammatory disorders of vagina: Secondary | ICD-10-CM | POA: Diagnosis not present

## 2024-11-28 DIAGNOSIS — F419 Anxiety disorder, unspecified: Secondary | ICD-10-CM | POA: Diagnosis present

## 2024-11-28 DIAGNOSIS — Z87442 Personal history of urinary calculi: Secondary | ICD-10-CM | POA: Diagnosis not present

## 2024-11-28 DIAGNOSIS — F32A Depression, unspecified: Secondary | ICD-10-CM | POA: Diagnosis present

## 2024-11-28 DIAGNOSIS — Z833 Family history of diabetes mellitus: Secondary | ICD-10-CM | POA: Diagnosis not present

## 2024-11-28 DIAGNOSIS — Z79899 Other long term (current) drug therapy: Secondary | ICD-10-CM | POA: Diagnosis not present

## 2024-11-28 DIAGNOSIS — E785 Hyperlipidemia, unspecified: Secondary | ICD-10-CM | POA: Diagnosis present

## 2024-11-28 DIAGNOSIS — Z794 Long term (current) use of insulin: Secondary | ICD-10-CM | POA: Diagnosis not present

## 2024-11-28 DIAGNOSIS — R531 Weakness: Secondary | ICD-10-CM | POA: Diagnosis present

## 2024-11-28 DIAGNOSIS — I120 Hypertensive chronic kidney disease with stage 5 chronic kidney disease or end stage renal disease: Secondary | ICD-10-CM | POA: Diagnosis present

## 2024-11-28 DIAGNOSIS — D631 Anemia in chronic kidney disease: Secondary | ICD-10-CM | POA: Diagnosis present

## 2024-11-28 DIAGNOSIS — Z992 Dependence on renal dialysis: Secondary | ICD-10-CM | POA: Diagnosis not present

## 2024-11-28 DIAGNOSIS — A419 Sepsis, unspecified organism: Secondary | ICD-10-CM | POA: Diagnosis not present

## 2024-11-28 DIAGNOSIS — A4159 Other Gram-negative sepsis: Secondary | ICD-10-CM | POA: Diagnosis present

## 2024-11-28 DIAGNOSIS — Z1152 Encounter for screening for COVID-19: Secondary | ICD-10-CM | POA: Diagnosis not present

## 2024-11-28 LAB — CBC
HCT: 24.7 % — ABNORMAL LOW (ref 36.0–46.0)
Hemoglobin: 7.8 g/dL — ABNORMAL LOW (ref 12.0–15.0)
MCH: 32.5 pg (ref 26.0–34.0)
MCHC: 31.6 g/dL (ref 30.0–36.0)
MCV: 102.9 fL — ABNORMAL HIGH (ref 80.0–100.0)
Platelets: 268 K/uL (ref 150–400)
RBC: 2.4 MIL/uL — ABNORMAL LOW (ref 3.87–5.11)
RDW: 16.2 % — ABNORMAL HIGH (ref 11.5–15.5)
WBC: 11.5 K/uL — ABNORMAL HIGH (ref 4.0–10.5)
nRBC: 0 % (ref 0.0–0.2)

## 2024-11-28 LAB — BASIC METABOLIC PANEL WITH GFR
Anion gap: 14 (ref 5–15)
BUN: 24 mg/dL — ABNORMAL HIGH (ref 8–23)
CO2: 25 mmol/L (ref 22–32)
Calcium: 9.1 mg/dL (ref 8.9–10.3)
Chloride: 96 mmol/L — ABNORMAL LOW (ref 98–111)
Creatinine, Ser: 5.36 mg/dL — ABNORMAL HIGH (ref 0.44–1.00)
GFR, Estimated: 8 mL/min — ABNORMAL LOW
Glucose, Bld: 167 mg/dL — ABNORMAL HIGH (ref 70–99)
Potassium: 3.5 mmol/L (ref 3.5–5.1)
Sodium: 135 mmol/L (ref 135–145)

## 2024-11-28 LAB — GLUCOSE, CAPILLARY
Glucose-Capillary: 160 mg/dL — ABNORMAL HIGH (ref 70–99)
Glucose-Capillary: 161 mg/dL — ABNORMAL HIGH (ref 70–99)
Glucose-Capillary: 253 mg/dL — ABNORMAL HIGH (ref 70–99)
Glucose-Capillary: 174 mg/dL — ABNORMAL HIGH (ref 70–99)

## 2024-11-28 MED ORDER — HEPARIN SODIUM (PORCINE) 1000 UNIT/ML IJ SOLN
INTRAMUSCULAR | Status: AC
  Filled 2024-11-28: qty 3

## 2024-11-28 MED ORDER — LIDOCAINE-PRILOCAINE 2.5-2.5 % EX CREA: 1.0000 | TOPICAL_CREAM | CUTANEOUS | Status: DC | PRN

## 2024-11-28 MED ORDER — PENTAFLUOROPROP-TETRAFLUOROETH EX AERO: 1.0000 | INHALATION_SPRAY | CUTANEOUS | Status: DC | PRN

## 2024-11-28 MED ORDER — EPOETIN ALFA-EPBX 10000 UNIT/ML IJ SOLN
10000.0000 [IU] | INTRAMUSCULAR | Status: DC
  Administered 2024-11-28: 10000 [IU] via INTRAVENOUS

## 2024-11-28 MED ORDER — HEPARIN SODIUM (PORCINE) 1000 UNIT/ML DIALYSIS
25.0000 [IU]/kg | INTRAMUSCULAR | Status: DC | PRN
  Administered 2024-11-28: 2300 [IU] via INTRAVENOUS_CENTRAL

## 2024-11-28 MED ORDER — HEPARIN SODIUM (PORCINE) 1000 UNIT/ML DIALYSIS: 1000.0000 [IU] | INTRAMUSCULAR | Status: DC | PRN

## 2024-11-28 MED ORDER — EPOETIN ALFA-EPBX 10000 UNIT/ML IJ SOLN
INTRAMUSCULAR | Status: AC
  Filled 2024-11-28: qty 1

## 2024-11-28 NOTE — Progress Notes (Signed)
 " Progress Note   Patient: Heather Lopez FMW:[id number] DOB: Aug 09, 1946 DOA: 11/26/2024     0 DOS: the patient was seen and examined on 11/28/2024   Brief hospital course:  The patient is a 79 yr old woman who presented to [facility] ED on 11/26/2024 with complaints of chills and fever. The day before presentation the patient felt weak and h ad hematuria. A UA was performed at the dialysis center on 11/25/2025 that was positive for UTI. She developed shaking chills upon arrival home from HD.   The patient had been admitted to the hospital for #. Coli pyelonephritis with sepsis and urinary retention that required a foley catheter for a week.   In the ED the patient was febrile with temp of 103.2, tachycardic to 100 and tachypneic to 29. BP was 156/50 and O2 saturation was 93%. UA demonstrated large leukocytosis.   CXR demonstrated bronchovascular crowding without infiltrate.  She has been admitted to a med-surg bed under observation status due to UTI and sepsis. She is receiving IV Rocephin .   On 11/28/2023 nursing report yellow green vaginal discharge. The discharge was cultured, and doxycycline  was added to her Rocephin .  On 11/28/2024 the patient's urine has grown enterobacter cloacae. Susceptibilities are pending. The patient went for HD today. She has tolerated the procedure well.  Assessment and Plan: * Sepsis secondary to acute cystitis with hematuria (HCC) Nephrolithiasis with history of ureteral stent 2023 History of urinary retention requiring short-term Foley 09/2024 History of E. coli pyelonephritis with sepsis 09/2024 Sepsis criteria include fever, tachycardia and tachypnea with lactic acidosis, abnormal UA Will continue treatment with Rocephin  Sepsis fluids with close monitoring for fluid overload in view of dialysis status Antipyretics, antiemetics Patient seen by urology 11/25/2024.  Consultation not necessary at this time No concerns for urinary retention at this time  ESRD on dialysis  [facility]) Nephrology consult for continuation of dialysis Patient had a full session on 11/26/2024  T2DM (type 2 diabetes mellitus) (HCC) Sliding scale insulin  coverage Continue basal insulin   Essential hypertension Will hold antihypertensives in the setting of sepsis to resume as appropriate        Subjective: The patient is resting comfortably. No new complaints.   Physical Exam: Vitals:   11/28/24 1230 11/28/24 1300 11/28/24 1325 11/28/24 1554  BP: (!) 141/70 (!) 150/73 (!) 153/62 (!) 126/54  Pulse: (!) 104 (!) 102 (!) 104 (!) 107  Resp: 14 (!) 22 (!) 22 16  Temp:   98.4 F (36.9 C) 98.6 F (37 C)  TempSrc:   Oral Oral  SpO2: 93% 95% 95% 100%  Weight:   91.4 kg   Height:       Exam:  Constitutional:  The patient is awake, alert, and oriented x 3. No acute distress. Respiratory:  No increased work of breathing. No wheezes, rales, or rhonchi No tactile fremitus Cardiovascular:  Regular rate and rhythm No murmurs, ectopy, or gallups. No lateral PMI. No thrills. Abdomen:  Abdomen is soft, non-tender, non-distended No hernias, masses, or organomegaly Normoactive bowel sounds.  Musculoskeletal:  No cyanosis, clubbing, or edema Skin:  No rashes, lesions, ulcers palpation of skin: no induration or nodules Neurologic:  CN 2-12 intact Sensation all 4 extremities intact Psychiatric:  Mental status Mood, affect appropriate Orientation to person, place, time  judgment and insight appear intact  Data Reviewed:  CBC, BMP  Family Communication: None available.  Disposition: Status is: Inpatient   Planned Discharge Destination: tbd    Time spent: 38 minutes  Author: Keondrick Dilks, DO 11/28/2024 4:39 PM  For on call review [email or web address].  "

## 2024-11-28 NOTE — Plan of Care (Signed)

## 2024-11-28 NOTE — Progress Notes (Signed)
 Hemodialysis Note:  Received patient in bed to unit. Alert and oriented. Informed consent singed and in chart.  Treatment initiated: 0830 Treatment completed: 1325  Access used: Left AVF Access issues: Blood Flow Rate 250-300 ml/hr due to low arterial pressure and high venous pressure. Restock AVF due to blood clot  Patient tolerated well. Transported back to room, alert without acute distress. Report given to patient's RN.  Total UF removed: 1.5 Liter Medications given: Retacrit  10000 units IV  Post HD weight: 91.4 Kg  [facility]

## 2024-11-28 NOTE — Plan of Care (Signed)
   Problem: Education: Goal: Knowledge of General Education information will improve Description: Including pain rating scale, medication(s)/side effects and non-pharmacologic comfort measures Outcome: Progressing   Problem: Nutrition: Goal: Adequate nutrition will be maintained Outcome: Progressing   Problem: Safety: Goal: Ability to remain free from injury will improve Outcome: Progressing

## 2024-11-28 NOTE — Progress Notes (Signed)
 " [facility]  ROUNDING NOTE   Subjective:   Heather Lopez is a 79 y.o.  female with end stage renal disease on hemodialysis, hypertension, GERD, gout, hyeprlipidemia, diabetes mellitus type II and vertigo.  Patient presents to the emergency department complaining of chills and fever. She has been admitted for General weakness [R53.1] Sepsis secondary to UTI (HCC) [A41.9, N39.0] Fever, unspecified fever cause [R50.9] Urinary tract infection without hematuria, site unspecified [N39.0]  Patient is known to our practice and receives outpatient dialysis treatments at [facility] on a TTS schedule, supervised by Dr. Douglas.   Patient seen and evaluated during dialysis   HEMODIALYSIS FLOWSHEET:  Blood Flow Rate (mL/min): 349 mL/min Arterial Pressure (mmHg): -227.87 mmHg Venous Pressure (mmHg): 299.18 mmHg TMP (mmHg): 13.73 mmHg Ultrafiltration Rate (mL/min): 895 mL/min Dialysate Flow Rate (mL/min): 299 ml/min  Denies pain or discomfort with urination Room air  Objective:  Vital signs in last 24 hours:  Temp:  [98.1 F (36.7 C)-99.4 F (37.4 C)] 98.9 F (37.2 C) (01/03 0800) Pulse Rate:  [81-108] 99 (01/03 1100) Resp:  [14-23] 18 (01/03 1100) BP: (149-170)/(51-72) 162/65 (01/03 1100) SpO2:  [92 %-98 %] 96 % (01/03 1100) Weight:  [92.9 kg] 92.9 kg (01/03 0800)  Weight change:  Filed Weights   11/26/24 1958 11/27/24 0302 11/28/24 0800  Weight: 96.9 kg 94.1 kg 92.9 kg    Intake/Output: I/O last 3 completed shifts: In: 1340 [P.O.:240; IV Piggyback:1100] Out: -    Intake/Output this shift:  No intake/output data recorded.  Physical Exam: General: NAD  Head: Normocephalic, atraumatic. Moist oral mucosal membranes  Eyes: Anicteric  Lungs:  Clear to auscultation, room air  Heart: Regular rate and rhythm  Abdomen:  Soft, nontender  Extremities:  No peripheral edema.  Neurologic: Awake, alert, conversant  Skin: Warm,dry, no rash  Access: Lt AVF    Basic  Metabolic Panel: Recent Labs  Lab 11/26/24 2013 11/28/24 0517  NA 137 135  K 4.1 3.5  CL 94* 96*  CO2 29 25  GLUCOSE 189* 167*  BUN 14 24*  CREATININE 3.55* 5.36*  CALCIUM  9.1 9.1    Liver Function Tests: Recent Labs  Lab 11/26/24 2013  AST 17  ALT 13  ALKPHOS 143*  BILITOT 0.3  PROT 7.4  ALBUMIN  3.5   Recent Labs  Lab 11/26/24 2013  LIPASE 51   No results for input(s): AMMONIA in the last 168 hours.  CBC: Recent Labs  Lab 11/26/24 2013 11/28/24 0517  WBC 10.2 11.5*  NEUTROABS 8.0*  --   HGB 9.2* 7.8*  HCT 28.6* 24.7*  MCV 104.0* 102.9*  PLT 319 268    Cardiac Enzymes: No results for input(s): CKTOTAL, CKMB, CKMBINDEX, TROPONINI in the last 168 hours.  BNP: Invalid input(s): POCBNP  CBG: Recent Labs  Lab 11/27/24 0759 11/27/24 1219 11/27/24 1713 11/27/24 1950 11/28/24 0719  GLUCAP 175* 209* 163* 166* 160*    Microbiology: Results for orders placed or performed during the hospital encounter of 11/26/24  Resp panel by RT-PCR (RSV, Flu A&B, Covid) Anterior Nasal Swab     Status: None   Collection Time: 11/26/24  8:11 PM   Specimen: Anterior Nasal Swab  Result Value Ref Range Status   SARS Coronavirus 2 by RT PCR NEGATIVE NEGATIVE Final    Comment: (NOTE) SARS-CoV-2 target nucleic acids are NOT DETECTED.  The SARS-CoV-2 RNA is generally detectable in upper respiratory specimens during the acute phase of infection. The lowest concentration of SARS-CoV-2  viral copies this assay can detect is 138 copies/mL. A negative result does not preclude SARS-Cov-2 infection and should not be used as the sole basis for treatment or other patient management decisions. A negative result may occur with  improper specimen collection/handling, submission of specimen other than nasopharyngeal swab, presence of viral mutation(s) within the areas targeted by this assay, and inadequate number of viral copies(<138 copies/mL). A negative result must be  combined with clinical observations, patient history, and epidemiological information. The expected result is Negative.  Fact Sheet for Patients:  bloggercourse.com  Fact Sheet for Healthcare Providers:  seriousbroker.it  This test is no t yet approved or cleared by the [place]  FDA and  has been authorized for detection and/or diagnosis of SARS-CoV-2 by FDA under an Emergency Use Authorization (EUA). This EUA will remain  in effect (meaning this test can be used) for the duration of the COVID-19 declaration under Section 564(b)(1) of the Act, 21 U.S.C.section 360bbb-3(b)(1), unless the authorization is terminated  or revoked sooner.       Influenza A by PCR NEGATIVE NEGATIVE Final   Influenza B by PCR NEGATIVE NEGATIVE Final    Comment: (NOTE) The Xpert Xpress SARS-CoV-2/FLU/RSV plus assay is intended as an aid in the diagnosis of influenza from Nasopharyngeal swab specimens and should not be used as a sole basis for treatment. Nasal washings and aspirates are unacceptable for Xpert Xpress SARS-CoV-2/FLU/RSV testing.  Fact Sheet for Patients: bloggercourse.com  Fact Sheet for Healthcare Providers: seriousbroker.it  This test is not yet approved or cleared by the [place]  FDA and has been authorized for detection and/or diagnosis of SARS-CoV-2 by FDA under an Emergency Use Authorization (EUA). This EUA will remain in effect (meaning this test can be used) for the duration of the COVID-19 declaration under Section 564(b)(1) of the Act, 21 U.S.C. section 360bbb-3(b)(1), unless the authorization is terminated or revoked.     Resp Syncytial Virus by PCR NEGATIVE NEGATIVE Final    Comment: (NOTE) Fact Sheet for Patients: bloggercourse.com  Fact Sheet for Healthcare Providers: seriousbroker.it  This test is not yet  approved or cleared by the [place]  FDA and has been authorized for detection and/or diagnosis of SARS-CoV-2 by FDA under an Emergency Use Authorization (EUA). This EUA will remain in effect (meaning this test can be used) for the duration of the COVID-19 declaration under Section 564(b)(1) of the Act, 21 U.S.C. section 360bbb-3(b)(1), unless the authorization is terminated or revoked.  Performed at [facility], [address] [place], [place] [address]   Blood culture (routine x 2)     Status: None (Preliminary result)   Collection Time: 11/26/24  8:12 PM   Specimen: BLOOD RIGHT ARM  Result Value Ref Range Status   Specimen Description BLOOD RIGHT ARM  Final   Special Requests   Final    BOTTLES DRAWN AEROBIC AND ANAEROBIC Blood Culture results may not be optimal due to an inadequate volume of blood received in culture bottles   Culture   Final    NO GROWTH 2 DAYS Performed at [facility], [address], [place], [place] [address]    Report Status PENDING  Incomplete  Blood culture (routine x 2)     Status: None (Preliminary result)   Collection Time: 11/26/24  8:12 PM   Specimen: BLOOD RIGHT FOREARM  Result Value Ref Range Status   Specimen Description BLOOD RIGHT FOREARM  Final   Special Requests   Final    BOTTLES  DRAWN AEROBIC AND ANAEROBIC Blood Culture results may not be optimal due to an inadequate volume of blood received in culture bottles   Culture   Final    NO GROWTH 2 DAYS Performed at [facility], [address] [place], [place] [address]    Report Status PENDING  Incomplete  Body fluid culture w Gram Stain     Status: None (Preliminary result)   Collection Time: 11/27/24 11:34 AM   Specimen: Vaginal; Body Fluid  Result Value Ref Range Status   Specimen Description   Final    VAGINA Performed at [facility], [address] [place], [place] [address]    Special Requests SWAB  Final   Gram Stain   Final     NO WBC SEEN NO ORGANISMS SEEN Performed at [facility], [address], [place], [place] [address]    Culture PENDING  Incomplete   Report Status PENDING  Incomplete    Coagulation Studies: Recent Labs    11/27/24 0516  LABPROT 15.6*  INR 1.2    Urinalysis: Recent Labs    11/26/24 2107  COLORURINE YELLOW*  LABSPEC 1.005  PHURINE 7.0  GLUCOSEU NEGATIVE  HGBUR SMALL*  BILIRUBINUR NEGATIVE  KETONESUR NEGATIVE  PROTEINUR 30*  NITRITE NEGATIVE  LEUKOCYTESUR LARGE*      Imaging: DG Chest Portable 1 View Result Date: 11/26/2024 EXAM: 1 VIEW(S) XRAY OF THE CHEST 11/26/2024 09:26:00 PM COMPARISON: 04/23/2024 CLINICAL HISTORY: evaluate for pna FINDINGS: LINES, TUBES AND DEVICES: Right internal jugular central venous catheter removed. LUNGS AND PLEURA: Low lung volumes with bronchovascular crowding. Mild right atelectasis. Stable left basilar scarring. No pleural effusion. No pneumothorax. HEART AND MEDIASTINUM: No acute abnormality of the cardiac and mediastinal silhouettes. BONES AND SOFT TISSUES: No acute osseous abnormality. IMPRESSION: 1. No focal consolidation to suggest pneumonia; low lung volumes with bronchovascular crowding and mild right atelectasis. 2. Stable left basilar scarring. Electronically signed by: Dorethia Molt MD 11/26/2024 09:42 PM EST RP Workstation: [id number]     Medications:    cefTRIAXone  (ROCEPHIN )  IV 2 g (11/27/24 2224)    aspirin  EC  81 mg Oral Daily   atorvastatin   20 mg Oral Daily   buPROPion   150 mg Oral BH-q7a   Chlorhexidine  Gluconate Cloth  6 each Topical Q0600   doxycycline   100 mg Oral Q12H   heparin   5,000 Units Subcutaneous Q8H   insulin  aspart  0-5 Units Subcutaneous QHS   insulin  aspart  0-6 Units Subcutaneous TID WC   insulin  glargine  10 Units Subcutaneous QHS   sevelamer  carbonate  1,600 mg Oral TID with meals   acetaminophen  **OR** acetaminophen , heparin , heparin , heparin , HYDROcodone -acetaminophen ,  lidocaine -prilocaine , lidocaine -prilocaine , loperamide , ondansetron  **OR** ondansetron  (ZOFRAN ) IV, pentafluoroprop-tetrafluoroeth, pentafluoroprop-tetrafluoroeth  Assessment/ Plan:  [name] is a 79 y.o.  female with end stage renal disease on hemodialysis, hypertension, GERD, gout, hyeprlipidemia, diabetes mellitus type II and vertigo.  Patient is currently admitted for General weakness [R53.1] Sepsis secondary to UTI (HCC) [A41.9, N39.0] Fever, unspecified fever cause [R50.9] Urinary tract infection without hematuria, site unspecified [N39.0]  [facility]/TTS/left AVF  Acute cystitis with hematuria, history of UTI and E-coli pyelonephritis. Septic workup due to fever, tachycardia and lactic acidosis. UA with leukocytes. Prescribed Rocephin  and doxycycline .   2.  End-stage renal disease on hemodialysis.  Last treatment received on Thursday.  Receiving dialysis today, UF goal 1.5L as tolerated. Next treatment scheduled for Tuesday.   3. Anemia of chronic kidney disease  Lab Results  Component Value Date   HGB 7.8 (L) 11/28/2024    Hemoglobin decreased, will order IV retacrit  10000 units with dialysis.   4.  Hypertension with chronic kidney disease.  Home regimen includes clonidine  and metoprolol , both currently held.  Blood pressure 162/65 during dialysis.      LOS: 0 Mintie Witherington 1/3/202611:10 AM   "

## 2024-11-29 LAB — BODY FLUID CULTURE W GRAM STAIN: Gram Stain: NONE SEEN

## 2024-11-29 LAB — URINE CULTURE: Culture: 10000 — AB

## 2024-11-29 LAB — GLUCOSE, CAPILLARY
Glucose-Capillary: 244 mg/dL — ABNORMAL HIGH (ref 70–99)
Glucose-Capillary: 207 mg/dL — ABNORMAL HIGH (ref 70–99)

## 2024-11-29 MED ORDER — DOXYCYCLINE HYCLATE 100 MG PO TABS: 100.0000 mg | ORAL_TABLET | Freq: Two times a day (BID) | ORAL | 0 refills | Status: AC

## 2024-11-29 MED ORDER — CIPROFLOXACIN HCL 500 MG PO TABS: 500.0000 mg | ORAL_TABLET | Freq: Two times a day (BID) | ORAL | 0 refills | Status: AC

## 2024-11-29 MED ORDER — METOPROLOL TARTRATE 25 MG PO TABS: 50.0000 mg | ORAL_TABLET | Freq: Two times a day (BID) | ORAL | 0 refills | Status: AC

## 2024-11-29 NOTE — Progress Notes (Signed)
 " [facility]  ROUNDING NOTE   Subjective:   Heather Lopez is a 79 y.o.  female with end stage renal disease on hemodialysis, hypertension, GERD, gout, hyeprlipidemia, diabetes mellitus type II and vertigo.  Patient presents to the emergency department complaining of chills and fever. She has been admitted for General weakness [R53.1] Sepsis secondary to UTI (HCC) [A41.9, N39.0] Fever, unspecified fever cause [R50.9] Urinary tract infection without hematuria, site unspecified [N39.0]  Patient is known to our practice and receives outpatient dialysis treatments at [facility] on a TTS schedule, supervised by Dr. Douglas.   Patient seen resting in bed No family present Remains on room air Denies pain or shortness of breath  Objective:  Vital signs in last 24 hours:  Temp:  [98.4 F (36.9 C)-99 F (37.2 C)] 98.8 F (37.1 C) (01/04 0831) Pulse Rate:  [102-109] 102 (01/04 0831) Resp:  [14-22] 17 (01/04 0831) BP: (126-164)/(54-93) 156/68 (01/04 0831) SpO2:  [92 %-100 %] 96 % (01/04 0831) Weight:  [91.4 kg] 91.4 kg (01/03 1325)  Weight change:  Filed Weights   11/27/24 0302 11/28/24 0800 11/28/24 1325  Weight: 94.1 kg 92.9 kg 91.4 kg    Intake/Output: I/O last 3 completed shifts: In: 200 [IV Piggyback:200] Out: 1500 [Other:1500]   Intake/Output this shift:  No intake/output data recorded.  Physical Exam: General: NAD  Head: Normocephalic, atraumatic. Moist oral mucosal membranes  Eyes: Anicteric  Lungs:  Clear to auscultation, room air  Heart: Regular rate and rhythm  Abdomen:  Soft, nontender  Extremities:  No peripheral edema.  Neurologic: Awake, alert, conversant  Skin: Warm,dry, no rash  Access: Lt AVF    Basic Metabolic Panel: Recent Labs  Lab 11/26/24 2013 11/28/24 0517  NA 137 135  K 4.1 3.5  CL 94* 96*  CO2 29 25  GLUCOSE 189* 167*  BUN 14 24*  CREATININE 3.55* 5.36*  CALCIUM  9.1 9.1    Liver Function Tests: Recent Labs  Lab  11/26/24 2013  AST 17  ALT 13  ALKPHOS 143*  BILITOT 0.3  PROT 7.4  ALBUMIN  3.5   Recent Labs  Lab 11/26/24 2013  LIPASE 51   No results for input(s): AMMONIA in the last 168 hours.  CBC: Recent Labs  Lab 11/26/24 2013 11/28/24 0517  WBC 10.2 11.5*  NEUTROABS 8.0*  --   HGB 9.2* 7.8*  HCT 28.6* 24.7*  MCV 104.0* 102.9*  PLT 319 268    Cardiac Enzymes: No results for input(s): CKTOTAL, CKMB, CKMBINDEX, TROPONINI in the last 168 hours.  BNP: Invalid input(s): POCBNP  CBG: Recent Labs  Lab 11/28/24 1435 11/28/24 1613 11/28/24 2103 11/29/24 0832 11/29/24 1135  GLUCAP 161* 253* 174* 244* 207*    Microbiology: Results for orders placed or performed during the hospital encounter of 11/26/24  Resp panel by RT-PCR (RSV, Flu A&B, Covid) Anterior Nasal Swab     Status: None   Collection Time: 11/26/24  8:11 PM   Specimen: Anterior Nasal Swab  Result Value Ref Range Status   SARS Coronavirus 2 by RT PCR NEGATIVE NEGATIVE Final    Comment: (NOTE) SARS-CoV-2 target nucleic acids are NOT DETECTED.  The SARS-CoV-2 RNA is generally detectable in upper respiratory specimens during the acute phase of infection. The lowest concentration of SARS-CoV-2 viral copies this assay can detect is 138 copies/mL. A negative result does not preclude SARS-Cov-2 infection and should not be used as the sole basis for treatment or other patient management decisions. A  negative result may occur with  improper specimen collection/handling, submission of specimen other than nasopharyngeal swab, presence of viral mutation(s) within the areas targeted by this assay, and inadequate number of viral copies(<138 copies/mL). A negative result must be combined with clinical observations, patient history, and epidemiological information. The expected result is Negative.  Fact Sheet for Patients:  bloggercourse.com  Fact Sheet for Healthcare Providers:   seriousbroker.it  This test is no t yet approved or cleared by the [place]  FDA and  has been authorized for detection and/or diagnosis of SARS-CoV-2 by FDA under an Emergency Use Authorization (EUA). This EUA will remain  in effect (meaning this test can be used) for the duration of the COVID-19 declaration under Section 564(b)(1) of the Act, 21 U.S.C.section 360bbb-3(b)(1), unless the authorization is terminated  or revoked sooner.       Influenza A by PCR NEGATIVE NEGATIVE Final   Influenza B by PCR NEGATIVE NEGATIVE Final    Comment: (NOTE) The Xpert Xpress SARS-CoV-2/FLU/RSV plus assay is intended as an aid in the diagnosis of influenza from Nasopharyngeal swab specimens and should not be used as a sole basis for treatment. Nasal washings and aspirates are unacceptable for Xpert Xpress SARS-CoV-2/FLU/RSV testing.  Fact Sheet for Patients: bloggercourse.com  Fact Sheet for Healthcare Providers: seriousbroker.it  This test is not yet approved or cleared by the [place]  FDA and has been authorized for detection and/or diagnosis of SARS-CoV-2 by FDA under an Emergency Use Authorization (EUA). This EUA will remain in effect (meaning this test can be used) for the duration of the COVID-19 declaration under Section 564(b)(1) of the Act, 21 U.S.C. section 360bbb-3(b)(1), unless the authorization is terminated or revoked.     Resp Syncytial Virus by PCR NEGATIVE NEGATIVE Final    Comment: (NOTE) Fact Sheet for Patients: bloggercourse.com  Fact Sheet for Healthcare Providers: seriousbroker.it  This test is not yet approved or cleared by the [place]  FDA and has been authorized for detection and/or diagnosis of SARS-CoV-2 by FDA under an Emergency Use Authorization (EUA). This EUA will remain in effect (meaning this test can be used) for  the duration of the COVID-19 declaration under Section 564(b)(1) of the Act, 21 U.S.C. section 360bbb-3(b)(1), unless the authorization is terminated or revoked.  Performed at [facility], [address] [place], [place] [address]   Blood culture (routine x 2)     Status: None (Preliminary result)   Collection Time: 11/26/24  8:12 PM   Specimen: BLOOD RIGHT ARM  Result Value Ref Range Status   Specimen Description BLOOD RIGHT ARM  Final   Special Requests   Final    BOTTLES DRAWN AEROBIC AND ANAEROBIC Blood Culture results may not be optimal due to an inadequate volume of blood received in culture bottles   Culture   Final    NO GROWTH 3 DAYS Performed at [facility], [address] [place], [place] [address]    Report Status PENDING  Incomplete  Blood culture (routine x 2)     Status: None (Preliminary result)   Collection Time: 11/26/24  8:12 PM   Specimen: BLOOD RIGHT FOREARM  Result Value Ref Range Status   Specimen Description BLOOD RIGHT FOREARM  Final   Special Requests   Final    BOTTLES DRAWN AEROBIC AND ANAEROBIC Blood Culture results may not be optimal due to an inadequate volume of blood received in culture bottles   Culture   Final    NO GROWTH 3  DAYS Performed at [facility], [address] [place], [place] [address]    Report Status PENDING  Incomplete  Urine Culture     Status: Abnormal   Collection Time: 11/26/24  9:07 PM   Specimen: Urine, Catheterized  Result Value Ref Range Status   Specimen Description   Final    URINE, CATHETERIZED Performed at [facility], [address], [place], [place] [address]    Special Requests   Final    NONE Performed at [facility], [address] [place], [place] [address]    Culture 10,000 COLONIES/mL ENTEROBACTER CLOACAE (A)  Final   Report Status 11/29/2024 FINAL  Final   Organism ID, Bacteria ENTEROBACTER CLOACAE (A)  Final      Susceptibility    Enterobacter cloacae - MIC*    CEFEPIME  <=0.12 SENSITIVE Sensitive     ERTAPENEM <=0.12 SENSITIVE Sensitive     CIPROFLOXACIN  <=0.06 SENSITIVE Sensitive     GENTAMICIN  <=1 SENSITIVE Sensitive     NITROFURANTOIN  32 SENSITIVE Sensitive     TRIMETH /SULFA  <=20 SENSITIVE Sensitive     PIP/TAZO Value in next row Sensitive      <=4 SENSITIVEThis is a modified FDA-approved test that has been validated and its performance characteristics determined by the reporting laboratory.  This laboratory is certified under the Clinical Laboratory Improvement Amendments CLIA as qualified to perform high complexity clinical laboratory testing.    MEROPENEM Value in next row Sensitive      <=4 SENSITIVEThis is a modified FDA-approved test that has been validated and its performance characteristics determined by the reporting laboratory.  This laboratory is certified under the Clinical Laboratory Improvement Amendments CLIA as qualified to perform high complexity clinical laboratory testing.    * 10,000 COLONIES/mL ENTEROBACTER CLOACAE  Body fluid culture w Gram Stain     Status: None (Preliminary result)   Collection Time: 11/27/24 11:34 AM   Specimen: Vaginal; Body Fluid  Result Value Ref Range Status   Specimen Description   Final    VAGINA Performed at [facility], [address], [place], [place] [address]    Special Requests SWAB  Final   Gram Stain NO WBC SEEN NO ORGANISMS SEEN   Final   Culture   Final    RARE ENTEROBACTER SPECIES IDENTIFICATION AND SUSCEPTIBILITIES TO FOLLOW Performed at [facility], [address] [place], [place] [address]    Report Status PENDING  Incomplete    Coagulation Studies: Recent Labs    11/27/24 0516  LABPROT 15.6*  INR 1.2    Urinalysis: Recent Labs    11/26/24 2107  COLORURINE YELLOW*  LABSPEC 1.005  PHURINE 7.0  GLUCOSEU NEGATIVE  HGBUR SMALL*  BILIRUBINUR NEGATIVE  KETONESUR NEGATIVE  PROTEINUR 30*  NITRITE NEGATIVE   LEUKOCYTESUR LARGE*      Imaging: No results found.    Medications:    cefTRIAXone  (ROCEPHIN )  IV 2 g (11/28/24 2210)    aspirin  EC  81 mg Oral Daily   atorvastatin   20 mg Oral Daily   buPROPion   150 mg Oral BH-q7a   Chlorhexidine  Gluconate Cloth  6 each Topical Q0600   doxycycline   100 mg Oral Q12H   epoetin  alfa-epbx (RETACRIT ) injection  10,000 Units Intravenous Q T,Th,Sat-1800   heparin   5,000 Units Subcutaneous Q8H   insulin  aspart  0-5 Units Subcutaneous QHS   insulin  aspart  0-6 Units Subcutaneous TID WC   insulin  glargine  10 Units Subcutaneous QHS   sevelamer   carbonate  1,600 mg Oral TID with meals   acetaminophen  **OR** acetaminophen , HYDROcodone -acetaminophen , loperamide , ondansetron  **OR** ondansetron  (ZOFRAN ) IV  Assessment/ Plan:  Heather Lopez is a 79 y.o.  female with end stage renal disease on hemodialysis, hypertension, GERD, gout, hyeprlipidemia, diabetes mellitus type II and vertigo.  Patient is currently admitted for General weakness [R53.1] Sepsis secondary to UTI (HCC) [A41.9, N39.0] Fever, unspecified fever cause [R50.9] Urinary tract infection without hematuria, site unspecified [N39.0]  [facility]/TTS/left AVF  Acute cystitis with hematuria, history of UTI and E-coli pyelonephritis. Septic workup due to fever, tachycardia and lactic acidosis. UA with leukocytes. Prescribed Rocephin  and doxycycline .   2.  End-stage renal disease on hemodialysis.  Received dialysis yesterday, 1.5L fluid removal achieved. Next treatment scheduled for Tuesday.   3. Anemia of chronic kidney disease Lab Results  Component Value Date   HGB 7.8 (L) 11/28/2024    Continue IV retacrit  10000 units with dialysis.   4.  Hypertension with chronic kidney disease.  Home regimen includes clonidine  and metoprolol , both currently held.  Blood pressure stable for this patient      LOS: 1 Stevi Hollinshead 1/4/202611:38 AM   "

## 2024-11-29 NOTE — Discharge Summary (Signed)
 " Physician Discharge Summary   Patient: Heather Lopez MRN: [id number] DOB: Aug 30, 1946  Admit date:     11/26/2024  Discharge date:   Discharge Physician: Brigida Bureau   PCP: Franchot Houston, PA-C   Recommendations at discharge:    Discharge to home Complete all antibiotics Follow up with PCP  in 7-10 days Keep all dialysis appointments  Discharge Diagnoses: Principal Problem:   Sepsis secondary to acute cystitis with hematuria [facility]) Active Problems:   Bilateral nephrolithiasis   Bilateral nephrolithiasis with history of ureteral stent 12/09/2021   Acute cystitis with hematuria   ESRD on dialysis [facility])   T2DM (type 2 diabetes mellitus) (HCC)   Essential hypertension  Resolved Problems:   * No resolved hospital problems. [facility] Course:  The patient is a 79 yr old woman who presented to [facility] ED on 11/26/2024 with complaints of chills and fever. The day before presentation the patient felt weak and h ad hematuria. A UA was performed at the dialysis center on 11/25/2025 that was positive for UTI. She developed shaking chills upon arrival home from HD.    The patient had been admitted to the hospital for #. Coli pyelonephritis with sepsis and urinary retention that required a foley catheter for a week.    In the ED the patient was febrile with temp of 103.2, tachycardic to 100 and tachypneic to 29. BP was 156/50 and O2 saturation was 93%. UA demonstrated large leukocytosis.    CXR demonstrated bronchovascular crowding without infiltrate.   She has been admitted to a med-surg bed under observation status due to UTI and sepsis. She is receiving IV Rocephin .    On 11/28/2023 nursing report yellow green vaginal discharge. The discharge was cultured, and doxycycline  was added to her Rocephin .   On 11/28/2024 the patient's urine has grown enterobacter cloacae. Susceptibilities are pending. The patient went for HD today. She has tolerated the procedure well.  The patient is feeling much better  on 11/29/2024. She has refused to walk in the halls and states that she does not walk at home. She will be discharged to home on PO Cipro .   Prior to admission the patient was on metoprolol  100 mg bid. This was held during her inpatient stay due to her normal blood pressures. On the day of discharge they are increased a little bit. She will be continued on her bid 0.1 clonidine , but her metoprolol  has been reduced to 25 mg bid. This should be re-evaluated by her PCP on her follow up visit.   Assessment and Plan: * Sepsis secondary to acute cystitis with hematuria (HCC) Nephrolithiasis with history of ureteral stent 2023 History of urinary retention requiring short-term Foley 09/2024 History of E. coli pyelonephritis with sepsis 09/2024 Sepsis criteria include fever, tachycardia and tachypnea with lactic acidosis, abnormal UA Will continue treatment with Rocephin  Sepsis fluids with close monitoring for fluid overload in view of dialysis status Antipyretics, antiemetics Patient seen by urology 11/25/2024.  Consultation not necessary at this time No concerns for urinary retention at this time  ESRD on dialysis [facility]) Nephrology consult for continuation of dialysis Patient had a full session on 11/26/2024  T2DM (type 2 diabetes mellitus) (HCC) Sliding scale insulin  coverage Continue basal insulin   Essential hypertension Will hold antihypertensives in the setting of sepsis to resume as appropriate  Prior to admission the patient was on metoprolol  100 mg bid. This was held during her inpatient stay due to her normal blood pressures. On the day of  discharge they are increased a little bit. She will be continued on her bid 0.1 clonidine , but her metoprolol  has been reduced to 25 mg bid. This should be re-evaluated by her PCP on her follow up visit       Consultants: None Procedures performed: None  Disposition: Home Diet recommendation:  Cardiac and Carb modified diet DISCHARGE  MEDICATION: Allergies as of 11/29/2024       Reactions   Contrast Media  [iodinated Contrast Media] Anaphylaxis   Iodine  Swelling   (IV only) - angioedema        Medication List     TAKE these medications    aspirin  EC 81 MG tablet Take 81 mg by mouth daily.   atorvastatin  20 MG tablet Commonly known as: LIPITOR Take 20 mg by mouth at bedtime.   buPROPion  150 MG 24 hr tablet Commonly known as: WELLBUTRIN  XL Take 150 mg by mouth every morning.   busPIRone  5 MG tablet Commonly known as: BUSPAR  Take 5 mg by mouth every morning.   ciprofloxacin  500 MG tablet Commonly known as: Cipro  Take 1 tablet (500 mg total) by mouth 2 (two) times daily for 5 days.   cloNIDine  0.1 MG tablet Commonly known as: CATAPRES  Take 1 tablet (0.1 mg total) by mouth 2 (two) times daily.   doxycycline  100 MG tablet Commonly known as: VIBRA -TABS Take 1 tablet (100 mg total) by mouth every 12 (twelve) hours.   Dulaglutide  3 MG/0.5ML Soaj Inject 3 mg into the skin once a week. Sundays   Insulin  Aspart FlexPen 100 UNIT/ML Commonly known as: NOVOLOG  Inject 14 Units into the skin 2 (two) times daily.   insulin  glargine 100 UNIT/ML injection Commonly known as: LANTUS  Inject 0.15 mLs (15 Units total) into the skin at bedtime.   Iron  (Ferrous Sulfate ) 325 (65 Fe) MG Tabs Take 1 tablet by mouth daily.   loperamide  2 MG tablet Commonly known as: IMODIUM  A-D Take 2 mg by mouth every 6 (six) hours as needed for diarrhea or loose stools.   metoprolol  tartrate 25 MG tablet Commonly known as: LOPRESSOR  Take 2 tablets (50 mg total) by mouth 2 (two) times daily. What changed:  medication strength how much to take   multivitamin with minerals Tabs tablet Take 1 tablet by mouth daily.   sevelamer  carbonate 800 MG tablet Commonly known as: RENVELA  Take 1,600 mg by mouth 3 (three) times daily.        Discharge Exam: Filed Weights   11/27/24 0302 11/28/24 0800 11/28/24 1325  Weight: 94.1  kg 92.9 kg 91.4 kg   Exam:  Constitutional:  The patient is awake, alert, and oriented x 3. No acute distress. Respiratory:  No increased work of breathing. No wheezes, rales, or rhonchi No tactile fremitus Cardiovascular:  Regular rate and rhythm No murmurs, ectopy, or gallups. No lateral PMI. No thrills. Abdomen:  Abdomen is soft, non-tender, non-distended No hernias, masses, or organomegaly Normoactive bowel sounds.  Musculoskeletal:  No cyanosis, clubbing, or edema Skin:  No rashes, lesions, ulcers palpation of skin: no induration or nodules Neurologic:  CN 2-12 intact Sensation all 4 extremities intact Psychiatric:  Mental status Mood, affect appropriate Orientation to person, place, time  judgment and insight appear intact   Condition at discharge: fair  The results of significant diagnostics from this hospitalization (including imaging, microbiology, ancillary and laboratory) are listed below for reference.   Imaging Studies: DG Chest Portable 1 View Result Date: 11/26/2024 EXAM: 1 VIEW(S) XRAY OF THE CHEST 11/26/2024  09:26:00 PM COMPARISON: 04/23/2024 CLINICAL HISTORY: evaluate for pna FINDINGS: LINES, TUBES AND DEVICES: Right internal jugular central venous catheter removed. LUNGS AND PLEURA: Low lung volumes with bronchovascular crowding. Mild right atelectasis. Stable left basilar scarring. No pleural effusion. No pneumothorax. HEART AND MEDIASTINUM: No acute abnormality of the cardiac and mediastinal silhouettes. BONES AND SOFT TISSUES: No acute osseous abnormality. IMPRESSION: 1. No focal consolidation to suggest pneumonia; low lung volumes with bronchovascular crowding and mild right atelectasis. 2. Stable left basilar scarring. Electronically signed by: Dorethia Molt MD 11/26/2024 09:42 PM EST RP Workstation: [id number]    Microbiology: Results for orders placed or performed during the hospital encounter of 11/26/24  Resp panel by RT-PCR (RSV, Flu A&B, Covid)  Anterior Nasal Swab     Status: None   Collection Time: 11/26/24  8:11 PM   Specimen: Anterior Nasal Swab  Result Value Ref Range Status   SARS Coronavirus 2 by RT PCR NEGATIVE NEGATIVE Final    Comment: (NOTE) SARS-CoV-2 target nucleic acids are NOT DETECTED.  The SARS-CoV-2 RNA is generally detectable in upper respiratory specimens during the acute phase of infection. The lowest concentration of SARS-CoV-2 viral copies this assay can detect is 138 copies/mL. A negative result does not preclude SARS-Cov-2 infection and should not be used as the sole basis for treatment or other patient management decisions. A negative result may occur with  improper specimen collection/handling, submission of specimen other than nasopharyngeal swab, presence of viral mutation(s) within the areas targeted by this assay, and inadequate number of viral copies(<138 copies/mL). A negative result must be combined with clinical observations, patient history, and epidemiological information. The expected result is Negative.  Fact Sheet for Patients:  bloggercourse.com  Fact Sheet for Healthcare Providers:  seriousbroker.it  This test is no t yet approved or cleared by the [place]  FDA and  has been authorized for detection and/or diagnosis of SARS-CoV-2 by FDA under an Emergency Use Authorization (EUA). This EUA will remain  in effect (meaning this test can be used) for the duration of the COVID-19 declaration under Section 564(b)(1) of the Act, 21 U.S.C.section 360bbb-3(b)(1), unless the authorization is terminated  or revoked sooner.       Influenza A by PCR NEGATIVE NEGATIVE Final   Influenza B by PCR NEGATIVE NEGATIVE Final    Comment: (NOTE) The Xpert Xpress SARS-CoV-2/FLU/RSV plus assay is intended as an aid in the diagnosis of influenza from Nasopharyngeal swab specimens and should not be used as a sole basis for treatment. Nasal washings  and aspirates are unacceptable for Xpert Xpress SARS-CoV-2/FLU/RSV testing.  Fact Sheet for Patients: bloggercourse.com  Fact Sheet for Healthcare Providers: seriousbroker.it  This test is not yet approved or cleared by the [place]  FDA and has been authorized for detection and/or diagnosis of SARS-CoV-2 by FDA under an Emergency Use Authorization (EUA). This EUA will remain in effect (meaning this test can be used) for the duration of the COVID-19 declaration under Section 564(b)(1) of the Act, 21 U.S.C. section 360bbb-3(b)(1), unless the authorization is terminated or revoked.     Resp Syncytial Virus by PCR NEGATIVE NEGATIVE Final    Comment: (NOTE) Fact Sheet for Patients: bloggercourse.com  Fact Sheet for Healthcare Providers: seriousbroker.it  This test is not yet approved or cleared by the [place]  FDA and has been authorized for detection and/or diagnosis of SARS-CoV-2 by FDA under an Emergency Use Authorization (EUA). This EUA will remain in effect (meaning this test can be used) for the duration of  the COVID-19 declaration under Section 564(b)(1) of the Act, 21 U.S.C. section 360bbb-3(b)(1), unless the authorization is terminated or revoked.  Performed at [facility], [address] [place], [place] [address]   Blood culture (routine x 2)     Status: None (Preliminary result)   Collection Time: 11/26/24  8:12 PM   Specimen: BLOOD RIGHT ARM  Result Value Ref Range Status   Specimen Description BLOOD RIGHT ARM  Final   Special Requests   Final    BOTTLES DRAWN AEROBIC AND ANAEROBIC Blood Culture results may not be optimal due to an inadequate volume of blood received in culture bottles   Culture   Final    NO GROWTH 3 DAYS Performed at [facility], [address] [place], [place] [address]    Report Status PENDING  Incomplete   Blood culture (routine x 2)     Status: None (Preliminary result)   Collection Time: 11/26/24  8:12 PM   Specimen: BLOOD RIGHT FOREARM  Result Value Ref Range Status   Specimen Description BLOOD RIGHT FOREARM  Final   Special Requests   Final    BOTTLES DRAWN AEROBIC AND ANAEROBIC Blood Culture results may not be optimal due to an inadequate volume of blood received in culture bottles   Culture   Final    NO GROWTH 3 DAYS Performed at [facility], [address], [place], [place] [address]    Report Status PENDING  Incomplete  Urine Culture     Status: Abnormal   Collection Time: 11/26/24  9:07 PM   Specimen: Urine, Catheterized  Result Value Ref Range Status   Specimen Description   Final    URINE, CATHETERIZED Performed at [facility], [address], [place], [place] [address]    Special Requests   Final    NONE Performed at [facility], [address] [place], [place] [address]    Culture 10,000 COLONIES/mL ENTEROBACTER CLOACAE (A)  Final   Report Status 11/29/2024 FINAL  Final   Organism ID, Bacteria ENTEROBACTER CLOACAE (A)  Final      Susceptibility   Enterobacter cloacae - MIC*    CEFEPIME  <=0.12 SENSITIVE Sensitive     ERTAPENEM <=0.12 SENSITIVE Sensitive     CIPROFLOXACIN  <=0.06 SENSITIVE Sensitive     GENTAMICIN  <=1 SENSITIVE Sensitive     NITROFURANTOIN  32 SENSITIVE Sensitive     TRIMETH /SULFA  <=20 SENSITIVE Sensitive     PIP/TAZO Value in next row Sensitive      <=4 SENSITIVEThis is a modified FDA-approved test that has been validated and its performance characteristics determined by the reporting laboratory.  This laboratory is certified under the Clinical Laboratory Improvement Amendments CLIA as qualified to perform high complexity clinical laboratory testing.    MEROPENEM Value in next row Sensitive      <=4 SENSITIVEThis is a modified FDA-approved test that has been validated and its performance characteristics determined by  the reporting laboratory.  This laboratory is certified under the Clinical Laboratory Improvement Amendments CLIA as qualified to perform high complexity clinical laboratory testing.    * 10,000 COLONIES/mL ENTEROBACTER CLOACAE  Body fluid culture w Gram Stain     Status: None (Preliminary result)   Collection Time: 11/27/24 11:34 AM   Specimen: Vaginal; Body Fluid  Result Value Ref Range Status   Specimen Description   Final    VAGINA Performed at [facility], [address] [place], [place] [address]    Special Requests SWAB  Final   Gram Stain NO WBC SEEN NO ORGANISMS SEEN   Final   Culture   Final    RARE ENTEROBACTER SPECIES IDENTIFICATION AND SUSCEPTIBILITIES TO FOLLOW Performed at [facility], [address], [place], [place] [address]    Report Status PENDING  Incomplete    Labs: CBC: Recent Labs  Lab 11/26/24 2013 11/28/24 0517  WBC 10.2 11.5*  NEUTROABS 8.0*  --   HGB 9.2* 7.8*  HCT 28.6* 24.7*  MCV 104.0* 102.9*  PLT 319 268   Basic Metabolic Panel: Recent Labs  Lab 11/26/24 2013 11/28/24 0517  NA 137 135  K 4.1 3.5  CL 94* 96*  CO2 29 25  GLUCOSE 189* 167*  BUN 14 24*  CREATININE 3.55* 5.36*  CALCIUM  9.1 9.1   Liver Function Tests: Recent Labs  Lab 11/26/24 2013  AST 17  ALT 13  ALKPHOS 143*  BILITOT 0.3  PROT 7.4  ALBUMIN  3.5   CBG: Recent Labs  Lab 11/28/24 0719 11/28/24 1435 11/28/24 1613 11/28/24 2103 11/29/24 0832  GLUCAP 160* 161* 253* 174* 244*    Discharge time spent: greater than 30 minutes.  Signed: Asriel Westrup, DO [facility] 11/29/2024 "

## 2024-12-01 LAB — CULTURE, BLOOD (ROUTINE X 2)
Culture: NO GROWTH
Culture: NO GROWTH

## 2024-12-01 LAB — BLOOD GAS, VENOUS
Bicarbonate: 33.5 mmol/L — ABNORMAL HIGH (ref 20.0–28.0)
O2 Saturation: 39.6 % — AB (ref 0.0–2.0)
Patient temperature: 37
Patient temperature: 39.6 %
pCO2, Ven: 43 mmHg — ABNORMAL LOW (ref 44–60)
pH, Ven: 7.5 — ABNORMAL HIGH (ref 7.25–7.43)
pO2, Ven: 33.5 mmHg — AB (ref 32–45)

## 2025-01-27 ENCOUNTER — Encounter (INDEPENDENT_AMBULATORY_CARE_PROVIDER_SITE_OTHER)

## 2025-01-27 ENCOUNTER — Ambulatory Visit (INDEPENDENT_AMBULATORY_CARE_PROVIDER_SITE_OTHER): Admitting: Nurse Practitioner

## 2025-11-29 ENCOUNTER — Ambulatory Visit: Admitting: Urology
